# Patient Record
Sex: Male | Born: 1945
Health system: Southern US, Community
[De-identification: ages and names within clinical notes are randomized; demographics above are authoritative.]

## PROBLEM LIST (undated history)

## (undated) DIAGNOSIS — I509 Heart failure, unspecified: Secondary | ICD-10-CM

## (undated) DIAGNOSIS — G2581 Restless legs syndrome: Secondary | ICD-10-CM

## (undated) DIAGNOSIS — I639 Cerebral infarction, unspecified: Secondary | ICD-10-CM

## (undated) DIAGNOSIS — R0602 Shortness of breath: Secondary | ICD-10-CM

## (undated) DIAGNOSIS — M869 Osteomyelitis, unspecified: Secondary | ICD-10-CM

## (undated) DIAGNOSIS — I6529 Occlusion and stenosis of unspecified carotid artery: Secondary | ICD-10-CM

## (undated) DIAGNOSIS — I951 Orthostatic hypotension: Secondary | ICD-10-CM

## (undated) DIAGNOSIS — J189 Pneumonia, unspecified organism: Secondary | ICD-10-CM

## (undated) DIAGNOSIS — K219 Gastro-esophageal reflux disease without esophagitis: Secondary | ICD-10-CM

## (undated) DIAGNOSIS — I10 Essential (primary) hypertension: Secondary | ICD-10-CM

## (undated) DIAGNOSIS — Z9889 Other specified postprocedural states: Secondary | ICD-10-CM

## (undated) DIAGNOSIS — R112 Nausea with vomiting, unspecified: Secondary | ICD-10-CM

## (undated) DIAGNOSIS — Z972 Presence of dental prosthetic device (complete) (partial): Secondary | ICD-10-CM

## (undated) DIAGNOSIS — L97524 Non-pressure chronic ulcer of other part of left foot with necrosis of bone: Secondary | ICD-10-CM

## (undated) DIAGNOSIS — E039 Hypothyroidism, unspecified: Secondary | ICD-10-CM

## (undated) DIAGNOSIS — M1990 Unspecified osteoarthritis, unspecified site: Secondary | ICD-10-CM

## (undated) DIAGNOSIS — I429 Cardiomyopathy, unspecified: Secondary | ICD-10-CM

## (undated) DIAGNOSIS — H9190 Unspecified hearing loss, unspecified ear: Secondary | ICD-10-CM

## (undated) DIAGNOSIS — I739 Peripheral vascular disease, unspecified: Secondary | ICD-10-CM

## (undated) DIAGNOSIS — G4733 Obstructive sleep apnea (adult) (pediatric): Secondary | ICD-10-CM

## (undated) HISTORY — PX: BACK SURGERY: SHX140

## (undated) HISTORY — PX: MULTIPLE TOOTH EXTRACTIONS: SHX2053

## (undated) HISTORY — DX: Occlusion and stenosis of unspecified carotid artery: I65.29

## (undated) HISTORY — DX: Peripheral vascular disease, unspecified: I73.9

## (undated) HISTORY — PX: OTHER SURGICAL HISTORY: SHX169

## (undated) HISTORY — DX: Non-pressure chronic ulcer of other part of left foot with necrosis of bone: L97.524

## (undated) HISTORY — DX: Osteomyelitis, unspecified: M86.9

## (undated) HISTORY — DX: Obstructive sleep apnea (adult) (pediatric): G47.33

## (undated) HISTORY — PX: EYE SURGERY: SHX253

## (undated) HISTORY — PX: FOOT SURGERY: SHX648

## (undated) HISTORY — PX: SPINE SURGERY: SHX786

## (undated) HISTORY — PX: CERVICAL FUSION: SHX112

## (undated) HISTORY — DX: Cerebral infarction, unspecified: I63.9

---

## 2005-06-23 ENCOUNTER — Ambulatory Visit (HOSPITAL_COMMUNITY): Admission: RE | Admit: 2005-06-23 | Discharge: 2005-06-23 | Payer: Self-pay | Admitting: Pulmonary Disease

## 2007-03-15 ENCOUNTER — Ambulatory Visit (HOSPITAL_COMMUNITY): Admission: RE | Admit: 2007-03-15 | Discharge: 2007-03-15 | Payer: Self-pay | Admitting: Pulmonary Disease

## 2007-04-13 ENCOUNTER — Inpatient Hospital Stay (HOSPITAL_COMMUNITY): Admission: RE | Admit: 2007-04-13 | Discharge: 2007-04-17 | Payer: Self-pay | Admitting: Neurosurgery

## 2007-05-26 ENCOUNTER — Encounter: Admission: RE | Admit: 2007-05-26 | Discharge: 2007-05-26 | Payer: Self-pay | Admitting: Neurosurgery

## 2007-05-28 ENCOUNTER — Encounter: Admission: RE | Admit: 2007-05-28 | Discharge: 2007-05-28 | Payer: Self-pay | Admitting: Neurosurgery

## 2007-06-05 ENCOUNTER — Ambulatory Visit (HOSPITAL_COMMUNITY): Admission: RE | Admit: 2007-06-05 | Discharge: 2007-06-05 | Payer: Self-pay | Admitting: Neurosurgery

## 2008-02-19 ENCOUNTER — Ambulatory Visit (HOSPITAL_COMMUNITY): Admission: RE | Admit: 2008-02-19 | Discharge: 2008-02-19 | Payer: Self-pay | Admitting: Ophthalmology

## 2008-04-08 ENCOUNTER — Ambulatory Visit (HOSPITAL_COMMUNITY): Admission: RE | Admit: 2008-04-08 | Discharge: 2008-04-08 | Payer: Self-pay | Admitting: Ophthalmology

## 2008-06-03 ENCOUNTER — Ambulatory Visit (HOSPITAL_COMMUNITY): Admission: RE | Admit: 2008-06-03 | Discharge: 2008-06-03 | Payer: Self-pay | Admitting: Pulmonary Disease

## 2008-06-10 ENCOUNTER — Ambulatory Visit (HOSPITAL_COMMUNITY): Admission: RE | Admit: 2008-06-10 | Discharge: 2008-06-10 | Payer: Self-pay | Admitting: Pulmonary Disease

## 2008-06-26 ENCOUNTER — Ambulatory Visit: Payer: Self-pay | Admitting: Orthopedic Surgery

## 2008-06-26 DIAGNOSIS — M19079 Primary osteoarthritis, unspecified ankle and foot: Secondary | ICD-10-CM | POA: Insufficient documentation

## 2008-06-26 HISTORY — DX: Primary osteoarthritis, unspecified ankle and foot: M19.079

## 2008-06-28 ENCOUNTER — Telehealth: Payer: Self-pay | Admitting: Orthopedic Surgery

## 2008-07-01 ENCOUNTER — Telehealth: Payer: Self-pay | Admitting: Orthopedic Surgery

## 2008-07-05 ENCOUNTER — Telehealth: Payer: Self-pay | Admitting: Orthopedic Surgery

## 2008-07-09 ENCOUNTER — Encounter: Admission: RE | Admit: 2008-07-09 | Discharge: 2008-07-09 | Payer: Self-pay | Admitting: Neurosurgery

## 2008-07-10 ENCOUNTER — Encounter: Payer: Self-pay | Admitting: Orthopedic Surgery

## 2008-08-06 ENCOUNTER — Ambulatory Visit (HOSPITAL_BASED_OUTPATIENT_CLINIC_OR_DEPARTMENT_OTHER): Admission: RE | Admit: 2008-08-06 | Discharge: 2008-08-07 | Payer: Self-pay | Admitting: Orthopedic Surgery

## 2008-10-29 ENCOUNTER — Encounter: Payer: Self-pay | Admitting: Orthopedic Surgery

## 2010-02-18 HISTORY — PX: ESOPHAGOGASTRODUODENOSCOPY: SHX1529

## 2010-02-20 ENCOUNTER — Ambulatory Visit: Payer: Self-pay | Admitting: Internal Medicine

## 2010-02-21 ENCOUNTER — Ambulatory Visit (HOSPITAL_COMMUNITY): Admission: EM | Admit: 2010-02-21 | Discharge: 2010-02-21 | Payer: Self-pay | Admitting: Emergency Medicine

## 2010-02-24 ENCOUNTER — Encounter: Payer: Self-pay | Admitting: Urgent Care

## 2010-03-13 ENCOUNTER — Ambulatory Visit (HOSPITAL_COMMUNITY): Admission: RE | Admit: 2010-03-13 | Discharge: 2010-03-13 | Payer: Self-pay | Admitting: Pulmonary Disease

## 2010-03-17 ENCOUNTER — Ambulatory Visit (HOSPITAL_COMMUNITY): Admission: RE | Admit: 2010-03-17 | Discharge: 2010-03-17 | Payer: Self-pay | Admitting: Pulmonary Disease

## 2010-03-18 ENCOUNTER — Encounter: Payer: Self-pay | Admitting: Gastroenterology

## 2010-03-19 ENCOUNTER — Inpatient Hospital Stay (HOSPITAL_COMMUNITY): Admission: AD | Admit: 2010-03-19 | Discharge: 2010-03-20 | Payer: Self-pay | Admitting: Neurological Surgery

## 2010-03-24 ENCOUNTER — Telehealth (INDEPENDENT_AMBULATORY_CARE_PROVIDER_SITE_OTHER): Payer: Self-pay

## 2010-03-30 ENCOUNTER — Encounter: Payer: Self-pay | Admitting: Internal Medicine

## 2010-05-18 ENCOUNTER — Ambulatory Visit (HOSPITAL_COMMUNITY): Admission: RE | Admit: 2010-05-18 | Discharge: 2010-05-18 | Payer: Self-pay | Admitting: Ophthalmology

## 2010-10-20 NOTE — Letter (Signed)
Summary: Internal Other Domingo Dimes  Internal Other Domingo Dimes   Imported By: Cloria Spring LPN 16/06/9603 54:09:81  _____________________________________________________________________  External Attachment:    Type:   Image     Comment:   External Document

## 2010-10-20 NOTE — Letter (Signed)
Summary: Dr Lestine Box Office notes  External Correspondence   Imported By: Elvera Maria 07/12/2008 08:58:16  _____________________________________________________________________  External Attachment:    Type:   Image     Comment:   Dr Lestine Box

## 2010-10-20 NOTE — Letter (Signed)
Summary: Dr Lestine Box Office notes  Dr Lestine Box Office notes   Imported By: Cammie Sickle 11/04/2008 10:25:24  _____________________________________________________________________  External Attachment:    Type:   Image     Comment:   External Document

## 2010-10-20 NOTE — Progress Notes (Signed)
Summary: call patient  Phone Note Call from Patient Call back at Home Phone [phone] Call back at [phone] cell   Summary of Call: please call patient as soon as you can, would not give info to me Initial call taken by: Chasity Tereasa Coop,  June 28, 2008 9:54 AM  Follow-up for Phone Call        called monday 12th 930pm

## 2010-10-20 NOTE — Medication Information (Signed)
Summary: Tax adviser   Imported By: Diana Eves 02/24/2010 11:04:39  _____________________________________________________________________  External Attachment:    Type:   Image     Comment:   External Document  Appended Document: RX Folder Pt needs solutabs given hx tight stricture and modified diet.  He may be able to change to capsules once re-dilated. Please let pharm know.  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 He said pt is not going to pick up solutabs because they will cost $70.00. He just got something over the counter for now.Please  advise!  Appended Document: RX Folder Any PPI OTC that he is able to swallow should be fine just verify this is a PPI and warn pt risks of choking until esophagus is stretched again.  Thanks  Appended Document: [facility] pharmacy aware, tried to call pt- LMOM

## 2010-10-20 NOTE — Assessment & Plan Note (Signed)
Summary: Rt foot pain/swelling/XR & MRI at [facility]   Vital Signs:  Patient Profile:   65 Years Old Male Weight:      263 pounds Pulse rate:   78 / minute Resp:     16 per minute  Vitals Entered By: Fuller Canada MD (June 26, 2008 2:12 PM)                 Chief Complaint:  right foot and ankle pain.  History of Present Illness: I saw Sylvio Hollister in the office today for an initial visit.  He is a 65 years old man with the complaint of:  chronic right foot, big toe pain and ankle pain with swelling, consult [facility].  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 He has difficulty kneeling, which he has to do frequently for his job, and with pushoff while walking. rt foot xrays taken [facility] 06-03-08.  MRI right foot 06-10-08 [facility].  rt ankle xrays 06-03-08 [facility].  Dr. Ezzard Standing performed lumbar disc surgery approximately one year ago and he has a residual dropfoot, spasms and weakness in the right leg from that surgery.  The surgery was done at the L4-L5 region.      Prior Medication List:  No prior medications documented  Updated Prior Medication List: he says no medicines other than the Advil that he took for his foot Current Allergies (reviewed today): ! CODEINE  Past Medical History:    Reviewed history and no changes required:       diabetic       htn       allergies       drop foot       back problems  Past Surgical History:    Reviewed history and no changes required:       lower back Nudelman 2008       eyes   Family History:    Reviewed history and no changes required:       FH of Cancer:        Family History of Arthritis  Social History:    Reviewed history and no changes required:       Patient is married.        med tech   Risk Factors:  Tobacco use:  never Caffeine use:  0 drinks per day Alcohol use:  no   Review of  Systems  General      Denies weight loss, weight gain, fever, chills, and fatigue.  Cardiac      Denies chest pain, angina, heart attack, heart failure, poor circulation, blood clots, and phlebitis.  Resp      Denies short of breath, difficulty breathing, COPD, cough, and pneumonia.  GI      Denies nausea, vomiting, diarrhea, constipation, difficulty swallowing, ulcers, GERD, and reflux.  GU      Denies kidney failure, kidney transplant, kidney stones, burning, poor stream, testicular cancer, blood in urine, and .  Neuro      Complains of unsteady walking.      Denies headache, dizziness, migraines, numbness, weakness, and tremor.  MS      Complains of joint swelling.      Denies joint pain, rheumatoid arthritis, gout, bone cancer, osteoporosis, and .  Endo      Complains of diabetes.      Denies thyroid disease and goiter.  Psych      Denies depression, mood swings, anxiety, panic attack, bipolar, and schizophrenia.  Derm      Denies eczema, cancer, and itching.  EENT      Denies poor vision, cataracts, glaucoma, poor hearing, vertigo, ears ringing, sinusitis, hoarseness, toothaches, and bleeding gums.  Immunology      Denies seasonal allergies, sinus problems, and allergic to bee stings.  Lymphatic      Denies lymph node cancer and lymph edema.   Physical Exam  Skin:     intact without lesions or rashes Psych:     alert and cooperative; normal mood and affect; normal attention span and concentration   Foot/Ankle Exam  General:    He is well-developed and nourished grooming and hygiene are normal and his body habitus is large  Gait:    His gait is altered by pain full vaulting over the right foot  Skin:    skin over the right foot normal  Inspection:    the great toe at the metatarsophalangeal joint is swollen and deformed  Palpation:    there is tenderness over the right great toe with passive motion of flexion 10 in extension 15  Vascular:     no swelling, normal temperature, no edema, no tenderness. Good capillary refill.  Sensory:    pressure sensation is normal soft touch sensation is normal  Motor:    abnormal motor function in the right foot as stated with a dropfoot.  Reflexes:    zero at the ankles  Foot Exam:    Right:    Inspection:  Abnormal    Palpation:  Abnormal    The great toe is tender and painful as we stated previously.  He has motion maintained but it is painful motion.    Impression & Recommendations:  Problem # 1:  ARTHRITIS, RIGHT FOOT (ICD-716.97) The x-rays were done at [facility]. The report and the films have been reviewed.  He has complete destruction of the great toe joint.  I recommended he see a foot and ankle surgeon for possible fusion. He is concerned that he'll not be able to work.  I do not think he will be able to continue the kneeling and squatting with or without surgery. He should be moved to a position of sedentary work is possible.    Orders: New Patient Level III ([address])    Patient Instructions: 1)  Refer to Dr. Lestine Box for Great toe arthritis with hammer toes and severe metatarsalgia. 2)  Please schedule a follow-up appointment as needed.   ]  Appended Document: Rt foot pain/swelling/XR & MRI at [facility] Mr Brandy shoul dbe restricted from kneeling, squatting and climbing.   Appended Document: Rt foot pain/swelling/XR & MRI at [facility]

## 2010-10-20 NOTE — Progress Notes (Signed)
Summary: phone call  Phone Note Call from Patient   Summary of Call: patient cannot get transfer within his job, will you please call him at these numbers   [phone], or [phone].  Initial call taken by: Fuller Canada MD,  July 01, 2008 9:56 AM

## 2010-10-20 NOTE — Letter (Signed)
Summary: Historic Patient File  Historic Patient File   Imported By: Jacklynn Ganong 06/27/2008 09:55:48  _____________________________________________________________________  External Attachment:    Type:   Image     Comment:   history form

## 2010-10-20 NOTE — Progress Notes (Signed)
Summary: Referral to Dr. Lestine Box.  Phone Note Outgoing Call   Call placed by: Waldon Reining,  July 05, 2008 10:45 AM Call placed to: Specialist Action Taken: Information Sent Summary of Call: I faxed a referral for this patient to Dr. Lestine Box to be seen for great toe arthritis with hammer toes and severe metatarsalgia.

## 2010-10-20 NOTE — Progress Notes (Signed)
Summary: Question of when to schedule EGD/ED...just had neck surgery  Phone Note Outgoing Call   Call placed by: Kham Zuckerman Call placed to: Patient Summary of Call: Called pt to schedule the EGD.... He said he just had major neck surgery on 03/19/2010.... he had a compressed spinal cord....had  a recent fall. He would like to know how long he needs to wait to schedule. Please advise! Initial call taken by: Cloria Spring LPN,  March 24, 1609 4:08 PM     Appended Document: Question of when to schedule EGD/ED...just had neck surgery chop up meats/ soft diet; no EGD / ED until nerosurgeon says OK  Appended Document: Question of when to schedule EGD/ED...just had neck surgery Called and got fax machine.  Appended Document: Question of when to schedule EGD/ED...just had neck surgery Pt informed.

## 2010-10-20 NOTE — Letter (Signed)
Summary: Internal Other/triage for EGD/ED  Internal Other/triage for EGD/ED   Imported By: Cloria Spring LPN 04/54/0981 19:14:78  _____________________________________________________________________  External Attachment:    Type:   Image     Comment:   External Document  Appended Document: Internal Other/triage for EGD/ED asa ok; he may stay on it for EGD w ED  Appended Document: Internal Other/triage for EGD/ED Please see 03/30/2010 append note.

## 2010-12-06 LAB — SURGICAL PCR SCREEN
MRSA, PCR: NEGATIVE
Staphylococcus aureus: NEGATIVE

## 2010-12-06 LAB — GLUCOSE, CAPILLARY
Glucose-Capillary: 209 mg/dL — ABNORMAL HIGH (ref 70–99)
Glucose-Capillary: 157 mg/dL — ABNORMAL HIGH (ref 70–99)
Glucose-Capillary: 163 mg/dL — ABNORMAL HIGH (ref 70–99)
Glucose-Capillary: 297 mg/dL — ABNORMAL HIGH (ref 70–99)

## 2010-12-06 LAB — BASIC METABOLIC PANEL
BUN: 13 mg/dL (ref 6–23)
CO2: 29 mEq/L (ref 19–32)
Calcium: 9.6 mg/dL (ref 8.4–10.5)
Chloride: 103 mEq/L (ref 96–112)
Creatinine, Ser: 0.87 mg/dL (ref 0.4–1.5)
GFR calc Af Amer: >60 mL/min (ref >60)
GFR calc non Af Amer: >60 mL/min (ref >60)
Glucose, Bld: 143 mg/dL — ABNORMAL HIGH (ref 70–99)
Potassium: 4.3 mEq/L (ref 3.5–5.1)
Sodium: 138 mEq/L (ref 135–145)

## 2010-12-06 LAB — CBC
HCT: 41.5 % (ref 39.0–52.0)
Hemoglobin: 14.1 g/dL (ref 13.0–17.0)
MCH: 32.5 pg (ref 26.0–34.0)
MCHC: 34.1 g/dL (ref 30.0–36.0)
MCV: 95.3 fL (ref 78.0–100.0)
Platelets: 268 10*3/uL (ref 150–400)
RBC: 4.35 MIL/uL (ref 4.22–5.81)
RDW: 13.4 % (ref 11.5–15.5)
WBC: 7.7 10*3/uL (ref 4.0–10.5)

## 2011-02-02 ENCOUNTER — Other Ambulatory Visit (HOSPITAL_COMMUNITY): Payer: Self-pay | Admitting: Pulmonary Disease

## 2011-02-02 ENCOUNTER — Ambulatory Visit (HOSPITAL_COMMUNITY)
Admission: RE | Admit: 2011-02-02 | Discharge: 2011-02-02 | Disposition: A | Discharge status: Home / Self Care | Payer: BC Managed Care – PPO | Source: Ambulatory Visit | Attending: Pulmonary Disease | Admitting: Pulmonary Disease

## 2011-02-02 DIAGNOSIS — T17910A Gastric contents in respiratory tract, part unspecified causing asphyxiation, initial encounter: Secondary | ICD-10-CM

## 2011-02-02 DIAGNOSIS — I10 Essential (primary) hypertension: Secondary | ICD-10-CM | POA: Insufficient documentation

## 2011-02-02 NOTE — Discharge Summary (Signed)
NAMECRISTHIAN, [name]              ACCOUNT NO.:  [id number]   MEDICAL RECORD NO.:  [id number]          PATIENT TYPE:  INP   LOCATION:  3033                         FACILITY:  [facility]   PHYSICIAN:  Hewitt Shorts, M.D.DATE OF BIRTH:  10-19-1945   DATE OF ADMISSION:  04/13/2007  DATE OF DISCHARGE:  04/17/2007                               DISCHARGE SUMMARY   ADMISSION HISTORY AND PHYSICAL EXAMINATION:  The patient is a 65-year-  old man who was treated for a right lumbar radiculopathy.  X-rays showed  a dynamic degenerative grade I spondylolisthesis at L4-5 secondary to  facet arthropathy.  There is moderate to marked multifactorial lumbar  stenosis at L4-5 with a degenerative spondylolisthesis, disc bulging and  facet arthropathy.  A large, right L4-5 extraforaminal disc herniation  with significant compression of the extraforaminal portion of the right  L4 nerve root.  Patient was admitted for decompression and arthrodesis.   PHYSICAL EXAMINATION:  GENERAL EXAMINATION:  Unremarkable.  NEUROLOGIC EXAMINATION:  Showed 5/5 strength.  Good sensation.   HOSPITAL COURSE:  Patient was admitted, underwent a bilateral L4 and L5  lumbar laminectomy, microdiskectomy and posterior lumbar antibody fusion  and posterior lateral arthrodesis.  Postoperatively, he had some nausea.  His mobility was gradual.  He was able to gradually increase his  activities.  At the time of discharge, his wound was healing well.  He  was voiding without difficulty, taking p.o. well and he was discharged  to home with instructions on wound care and activities.  He was to  return 4 days later for staple removal.  He was given prescriptions for  Percocet 1 or 2 q.4 to 6 hours p.r.n. pain, 60 tablets prescribed, no  refills.   DISCHARGE DIAGNOSIS:  Lumbar stenosis, lumbar degenerative  spondylolisthesis, lumbar spondylosis and lumbar radiculopathy.      Hewitt Shorts, M.D.  Electronically Signed     RWN/MEDQ  D:  05/17/2007  T:  05/17/2007  Job:  [id number]

## 2011-02-02 NOTE — H&P (Signed)
NAMEHUSSIEN, GREENBLATT              ACCOUNT NO.:  [id number]   MEDICAL RECORD NO.:  [id number]          PATIENT TYPE:  INP   LOCATION:  3033                         FACILITY:  [facility]   PHYSICIAN:  Hewitt Shorts, M.D.DATE OF BIRTH:  [birth year]   DATE OF ADMISSION:  04/13/2007  DATE OF DISCHARGE:                              HISTORY & PHYSICAL   HISTORY OF PRESENT ILLNESS:  This patient is a 65 year old, left-handed  white male who is evaluated regarding a right lumbar radiculopathy.  The  patient developed pain into the right groin and into the anterior right  thigh and leg, some superior right buttock pain.  No midline low back  pain.  Pain extending into the anterolateral aspect of the right thigh  and leg with burning and numbness.  The patient was treated with  hydrocodone and Naprosyn and evaluated with MRI.   X-rays showed a dynamic degenerative grade 1 spondylolisthesis of L4 and  5 with multilevel degenerative changes.  MRI scan revealed again the  grade 1 spondylolisthesis of L4 and 5 secondary to facet arthropathy.  There was moderate to marked multifactorial spinal stenosis at L4-5,  contributed to by the degenerative spondylolisthesis, disk bulging and  facet hypertrophy.  There was also a large right L4-5 extraforaminal  disk herniation with a significant compression of the extraforaminal  portion of the L4 nerve roots.   The patient is admitted now for a decompression and arthrodesis.   PAST MEDICAL HISTORY:  Notable for a history of hypertension and  diabetes for over 25 years.  No history of myocardial infarction,  cancer, stroke, peptic ulcer disease or lung disease.  No previous  surgeries.   ALLERGIES:  HE HAS NO ACTUAL ALLERGIES TO MEDICATIONS, BUT CODEINE CAN  CAUSE NAUSEA AND VOMITING.   MEDICATIONS:  Medications included:  1. Amaryl 4 mg daily.  2. Diovan/hydrochlorothiazide 160/12.5 mg daily.  3. Naproxen b.i.d.  4. Hydrocodone 10/500 q.i.d.  5.  Zyrtec daily.  6. Aspirin 81 mg daily.   FAMILY HISTORY:  His father passed on of causes unknown.  Mother is in  poor health at age 109 with hypertension.   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  He is married and  does not smoke, drink alcoholic beverages or have a history of substance  abuse.   REVIEW OF SYSTEMS:  Notable for those symptoms described in his history  of present illness and past medical history, but is otherwise  unremarkable.   PHYSICAL EXAMINATION:  GENERAL:  This patient is a well-developed, well-  nourished white male in no acute distress.  VITAL SIGNS:  His temperature is 98.7.  Pulse of 108.  Blood pressure  133/88.  Respiratory rate 18.  Height 6 feet.  Weight 258 pounds.  LUNGS:  Clear to auscultation.  He has symmetrical respirations and  excursion.  HEART:  Regular rate and rhythm, normal S1, S2.  There is no murmur.  ABDOMEN:  Soft, nondistended, bowel sounds are present.  EXTREMITY EXAMINATION:  Shows no clubbing, cyanosis or  edema.  MUSCULOSKELETAL EXAMINATION:  Shows limited mobility in forward flexion  to about 45 degrees due to pain.  He is able to extend though to 30  degrees.  There is no tenderness to palpation over the lumbar spine or  paraspinous area or parallel lumbar musculature.  Straight-leg raising  is negative on the left, but positive on the right with pain to the  right groin.  NEUROLOGICAL EXAMINATION:  Shows 5/5 strength of the lower extremities  including the iliopsoas, quadriceps, dorsiflexion, extension as well as  in plantar flexion bilaterally.  Sensation is intact to pinprick through  the upper and lower extremities.  Reflexes are minimal in the biceps,  quadriceps, gastrocnemii and is symmetrical bilaterally.  Toes are  downgoing bilaterally.  He has a normal gait and stance.   IMPRESSION:  Pain in the right upper buttock extending to the  right  groin and anterolateral right thigh and leg consistent with right L4  radiculopathy, which is felt to be due to a right L4-5 extraforaminal  disk herniation.  However, at that same level, he has moderate to marked  multifactorial lumbar stenosis and a dynamic degenerative grade 1  spondylolisthesis of L4 and 5 secondary to facet arthropathy.   PLAN:  Patient admitted for L4-5 decompressive lumbar laminectomy,  facetectomy, microdiskectomy, and right L4-5 extraforaminal  microdiskectomy, and a bilateral L4-5 posterior lumbar interbody fusion  with posterior arthrodesis with interbody implants, posterior  instrumentation and bone grafting.   We discussed alternatives to surgery, the extent and nature of his  condition and surgery, using x-rays, MRI scan and models in our office.  We discussed with him the surgery, hospital stay and overall  preparation, his limitations postoperatively, the need for postoperative  immobilization and a lumbar corset, and risks of surgery including the  risk of infection, bleeding, possible need for transfusion, the risk of  nerve dysfunction with pain, weakness, numbness or paresthesias, the  risk of dural tear and CSF and the possible need for further surgery,  the risk of failure of the arthrodesis and possible need for further  surgery, and anesthetic risks of myocardial infarction, stroke,  pneumonia and death.  Understanding all this, he wishes to go ahead with  his surgery.  He does  understand his risks are increased due to his  history of diabetes and hypertension.      Hewitt Shorts, M.D.  Electronically Signed     RWN/MEDQ  D:  04/14/2007  T:  04/14/2007  Job:  [id number]

## 2011-02-02 NOTE — Op Note (Signed)
[name], [name]              ACCOUNT NO.:  [id number]   MEDICAL RECORD NO.:  [id number]          PATIENT TYPE:  AMB   LOCATION:  DSC                          FACILITY:  [facility]   PHYSICIAN:  Leonides Grills, M.D.     DATE OF BIRTH:  03/12/1946   DATE OF PROCEDURE:  08/06/2008  DATE OF DISCHARGE:                               OPERATIVE REPORT   PREOPERATIVE DIAGNOSES:  1. Advanced right hallux rigidus.  2. Right tight gastroc.  3. Right second and third hammertoes.   POSTOPERATIVE DIAGNOSES:  1. Advanced right hallux rigidus.  2. Right tight gastroc.  3. Right second and third hammertoes.   OPERATION:  1. Right great toe metatarsophalangeal joint fusion.  2. Right local bone graft.  3. Stress x-rays, right foot.  4. Right great toe digital nerve neurolysis.  5. Right gastroc slide.  6. Right second and third toes metatarsophalangeal joint dorsal      capsulotomy and collateral release.  7. Right second and third toes proximal phalanx head resection.  8. Right second and third toes EDB to EDL tendon transfer.  9. Right second and third toes FDL to proximal phalanx tendon      transfer.   ANESTHESIA:  General.   SURGEON:  Leonides Grills, MD   ASSISTANT:  Richardean Canal, PA-C   ESTIMATED BLOOD LOSS:  Minimal.   TOURNIQUET TIME:  Approximately an hour and 20 minutes.   COMPLICATIONS:  None.   DISPOSITION:  Stable to the PR.   INDICATIONS:  This is a 65 year old gentleman who has had longstanding  right forefoot pain due to the above pathology.  He consented for the  above procedure.  All risks including infection or vessel injury,  nonunion, malunion, hardware irritation, hardware failure, persistent  pain, worsening pain, prolonged recovery, stiffness, arthritis,  recurrence of cock-up toe deformity of the second and third toes or  hammertoe deformity, ischemia especially due to diabetes were all  explained.  Questions were encouraged and answered.   DESCRIPTION OF  PROCEDURE:  The patient was brought to the operating room  and placed in supine position.  After adequate general endotracheal tube  anesthesia was administered as well as Ancef 1 g IV piggyback.  Right  lower extremity was then prepped and draped in sterile manner.  Proximally, a thigh tourniquet.  A longitudinal incision was made over  the medial aspect of gastrocnemius muscle tendinous junction.  Dissection was carried down through the skin.  Hemostasis was obtained.  Fascia was opened at line of the incision.  Conjoined region was then  developed between gastroc soleus musculature.  Soft tissue was elevated  off the posterior aspect of gastrocnemius.  Sural nerve was identified  and protected posteriorly.  Gastrocnemius was then released with curved  Mayo scissors.  This had excellent release of tight gastroc.  The areas  was copiously irrigated with normal saline.  Subcu was closed with 3-0  Vicryl and the skin was closed with 4-0 nylon.  The limb was gravity  exsanguinated.  Tourniquet was elevated to 290 mmHg.  A longitudinal  incision at midline over  medial aspect of the right great toe MTP joint  was then made.  Dissection was carried down through the skin.  Hemostasis was obtained.  A great toe dorsomedial and digital nerve was  carefully dissected out and formal digital nerve neurolysis was then  performed.  Capsulotomy was then made.  Soft tissues was elevated on  superomedial and plantar lateral aspects.  We removed the spur with a  rongeur and placed it on the back table as local bone graft.  The  remaining cartilage in the joint which was minimal was removed with a  curved corners osteotome and a rongeur and curette.  Multiple 2-mm  drills were placed on either side of the joint.  The MTP joint was  reduced and provisionally fixed with a 2-mm K-wire.  This was then  visualized under C-arm guidance, AP, and lateral planes and showed that  the proximal phalanx was parallel to  the weightbearing surface of the  foot and had an adequate varus valgus correction.  We then placed one  3.5-mm fully-threaded cortical lag screw using 3.5 and 2.5 mm drill  holes respectively.  This had excellent purchase and compression across  the fusion site.  K-wire was removed and replaced with a 3.5-mm fully-  threaded cortical lag screw using a 3.5 and 2.5 mm drill hole  respectively.  This had excellent purchase and maintenance of the  correction.  Stress x-rays were obtained in AP and lateral planes showed  no gross motion, fixation, proposition, and excellent alignment as well.  The area was copiously irrigated with normal saline.  Local bone graft  obtained not only from the spurs and drill bits, but also the proximal  phalanx heads were grounded up on back table and applied as stress  strain relieving bone graft at later on in the procedure.  The capsule  was then closed over this with a 2-0 Vicryl stitch and subcu was closed  with 3-0 Vicryl, and skin was closed with 4-0 nylon.  We then made a  longitudinal incision over the right second toe.  Dissection was carried  down through the skin.  Hemostasis was obtained.  EDB and EDL tendons  were identified.  The EDL was tenotomized proximal medial and brevis  distal lateral and retracted out of harm's way for later transfer.  MTP  joint dorsal capsulotomy and collateral release was then performed with  the #15 blade scalpel protecting the soft tissues both medially and  laterally.  Distal aspect of the proximal phalanx was then skeletonized  and the head was then removed with the rongeur and followed by bone  cutter.  This cut was made perpendicular to long axis of the proximal  phalanx.  Soft tissue was then elevated and skeletonized over the distal  aspect of the proximal phalanx.  A longitudinal incision was then made  into the plantar plate, FDL tendon was identified, and then tenotomized  distal as possible.  We then  performed a 2.5-mm and then followed by 3.5-  mm drill hole into the base of proximal phalanx.  We then transferred  the FDL to the proximal phalanx through the drill hole from plantar to  dorsum using 3-0 PDS stitch.  This had an outstanding transfer.  We then  placed a 0.054 K-wire antegrade through middle and distal phalanx  reduced PIP joint and with tension on the FDL tendon through drill hole  and the toe held in reduced position, fired the K-wire across the MTP  joint.  This held the toe in excellent position.  We then transferred  the EDB to EDL tendon dorsally using 3-0 PDS stitch.  This had an  outstanding repair and we sutured this to the stump of the FDL tendon.  We did the exact same procedure for the third toe through separate  incision.  Once this was completed, skin relieving incisions were made  on either side of  the K-wire.  K-wire was bent, cut, and capped.  Tourniquet was deflated  and hemostasis was obtained.  Toes pinked up nicely.  Subcu was closed  with 4-0 PDS and skin was closed with 4-0 nylon over all wounds.  Sterile dressing was applied.  Lightweight Jones dressing was applied.  The patient was stable to the PR.      Leonides Grills, M.D.  Electronically Signed     PB/MEDQ  D:  08/06/2008  T:  08/07/2008  Job:  [id number]   cc:   Vickki Hearing, M.D.

## 2011-02-02 NOTE — Op Note (Signed)
NAMEJET, ARMBRUST              ACCOUNT NO.:  [id number]   MEDICAL RECORD NO.:  [id number]          PATIENT TYPE:  INP   LOCATION:  3033                         FACILITY:  [facility]   PHYSICIAN:  Hewitt Shorts, M.D.DATE OF BIRTH:  07/21/1946   DATE OF PROCEDURE:  04/13/2007  DATE OF DISCHARGE:                               OPERATIVE REPORT   PREOPERATIVE DIAGNOSES:  1. L4-5 lumbar stenosis.  2. L4-5 spondylytic disk herniation.  3. Lumbar spondylosis.  4. Lumbar degenerative disk disease.  5. Lumbar radiculopathy.   POSTOPERATIVE DIAGNOSES:  1. L4-5 lumbar stenosis.  2. L4-5 spondylytic disk herniation.  3. Lumbar spondylosis.  4. Lumbar degenerative disk disease.  5. Lumbar radiculopathy.   PROCEDURE:  Bilateral L4-5 lumbar laminotomy, facetectomy,  microdiskectomy with microdissection and posterior lumbar interbody  fusion with AVS PEEK interbody implants and VITOSS with bone marrow  aspirate and posterolateral arthrodesis with radius posterior  instrumentation and VITOSS with bone marrow aspirate.   SURGEON:  Hewitt Shorts, M.D.   ASSISTANTS:  1. Russell L. Webb Silversmith, RN  2. Lovell Sheehan.   ANESTHESIA:  General endotracheal.   INDICATION:  The patient is a 65 year old man who presented with right  lumbar radiculopathy.  He was found to have extensive multilevel  degenerative disk disease and spondylosis.  He had a grade 1  degenerative spondylolisthesis at L4 and 5, cervical facet arthropathy  with moderate to marked multifactorial lumbar stenosis at L4-5  contributed to by the degenerative spondylolisthesis, disk protrusion  and facet hypertrophy.  There is also a right L4-5 extraforaminal disk  herniation with compression of the L4 nerve root extraforaminally.  Decision was made to proceed with decompression and stabilization.   PROCEDURE:  The patient was brought to the operating room, placed under  general endotracheal anesthesia.  The patient was turned to a  prone  position.  Lumbar region was prepped with Betadine soaping solution,  draped in a sterile fashion.  The midline was infiltrated with local  anesthetic with epinephrine.  A midline incision was made, carried down  through the subcutaneous tissue.  X-rays were taken for localization.  Dissection was carried down to the lumbar fascia which was incised  bilaterally.  The paraspinal muscles were dissected from the spinous  process and lamina in a subperiosteal fashion.  Another x-ray was taken  to further localize the L4-5 level and then using microdissection and  microsurgical technique, decompression was performed.  We proceeded with  bilateral L4-5 lumbar laminotomy and facetectomy using the XMax drill  and Kerrison punches.  The ligamentum flavum which was markedly  thickened was carefully removed.  Dissection was carried out  particularly laterally to the right exposing the exiting right L4 nerve  root we exposed the annulus of the L4-5 disk bilaterally.  Overlying  epidural veins were coagulated and divided and the spondylytic disk  herniation which was present in a broad based fashion was incised and  thorough diskectomy performed and removal of extensive amounts of  degenerative disk material.  Spondylytic bone growth in the posterior  aspects of the L4 and L5 vertebrae were removed  further decompressing  the spinal canal and exiting nerve roots.  We then began to prepare the  end plates of the vertebral bodies for arthrodesis using a variety of  paddle curets to scrape the cartilaginous end plates away exposing a  good bony surface bilaterally.  We then carefully examined the exiting  L4 nerve root to ensure that it was well decompressed.  This was  confirmed.  We then measured the height to the intervertebral disk space  and selected 11 mm height interbody implants.  We then probed the left  L5 pedicle.  Bone marrow was aspirated from the vertebral body and  injected over a 10  mL strip of VITOSS.  Using the VITOSS with bone  marrow aspirate, the PEEK cages were packed with the material and then  carefully tracking the thecal sac and nerve root, we placed the first  cage on the right side.  We then went to the left side packing  additional VITOSS with bone marrow aspirate in the midline of the  intervertebral disk space and then placed the second cage on the left  side.  We then took additional VITOSS with bone marrow aspirate and  packed it lateral to each of the cages in the intervertebral disk space.  We then draped the C-arm fluoroscope and it was brought into the field  to provide guidance in placing the pedicle fixation bilaterally.  We  identified the pedicle entry sites for L4 bilaterally as well as on the  right side at L5.  We reconfirmed the positioning on the left side at  L5.  Each of the pedicles was probed, examined with a ball probe.  No  cut outs were found.  Each of them was tapped with a 5.25 mm tap.  Again, examined with a ball probe, good threading was noted and no cut  outs were found.  We then placed 5.75 x 45 mm screws bilaterally at each  level.  We then selected 30 mm prelordosed rods that were placed in the  screw heads and locking caps were placed which were subsequently  tightened against a countertorque.  The transverse process at L4 and L5  had been previously exposed and decorticated.  The remaining VITOSS with  bone marrow aspirate was packed over the transverse process and  intertransverse space.  There was a small thinned area of dura along the  lateral aspect of the right side of the thecal sac.  We placed a single  6-0 Prolene in a figure-of-eight fashion to secure that closed and  placed a small pledget of Duraform over that.  After the stitch was  placed, we Valsalvad the patient.  No CSF leakage was noted and then the  Duraform was placed.  In the end, it was felt that good decompression  had been achieved and good  stabilization had been achieved.  The wound  was then closed in multiple layers.  The deep fascia was closed with  interrupted undyed 1 Vicryl sutures.  The subcutaneous and subcuticular  areas were closed with interrupted, inverted 2-0 undyed Vicryl sutures  and the skin edges were closed with surgical staples.  The wound was  dressed with Adaptic and sterile gauze and Hypafix.  The procedure was  tolerated well.  The estimated blood loss was 150 mL.  We did use a Cell  Saver during the procedure, but there was insufficient blood loss to  process the collected blood loss and the patient was subsequently to be  turned to a supine position, reversed from anesthetic, extubated and  transferred to the recovery room for further care.      Hewitt Shorts, M.D.  Electronically Signed     RWN/MEDQ  D:  04/13/2007  T:  04/13/2007  Job:  [id number]

## 2011-06-16 LAB — BASIC METABOLIC PANEL
BUN: 13
CO2: 28
Calcium: 9.4
Chloride: 103
Creatinine, Ser: 0.81
GFR calc Af Amer: >60
GFR calc non Af Amer: >60
Glucose, Bld: 157 — ABNORMAL HIGH
Potassium: 4.3
Sodium: 136

## 2011-06-16 LAB — HEMOGLOBIN AND HEMATOCRIT, BLOOD
HCT: 39
Hemoglobin: 13.8

## 2011-06-22 LAB — GLUCOSE, CAPILLARY
Glucose-Capillary: 119 — ABNORMAL HIGH
Glucose-Capillary: 124 — ABNORMAL HIGH
Glucose-Capillary: 148 — ABNORMAL HIGH
Glucose-Capillary: 158 — ABNORMAL HIGH
Glucose-Capillary: 240 — ABNORMAL HIGH

## 2011-06-22 LAB — BASIC METABOLIC PANEL
BUN: 11
CO2: 27
Calcium: 9
Chloride: 104
Creatinine, Ser: 0.79
GFR calc Af Amer: >60
GFR calc non Af Amer: >60
Glucose, Bld: 162 — ABNORMAL HIGH
Potassium: 4.2
Sodium: 137

## 2011-06-22 LAB — POCT HEMOGLOBIN-HEMACUE: Hemoglobin: 14

## 2011-07-02 ENCOUNTER — Other Ambulatory Visit (HOSPITAL_COMMUNITY): Payer: Self-pay | Admitting: Pulmonary Disease

## 2011-07-02 ENCOUNTER — Ambulatory Visit (HOSPITAL_COMMUNITY)
Admission: RE | Admit: 2011-07-02 | Discharge: 2011-07-02 | Disposition: A | Discharge status: Home / Self Care | Payer: BC Managed Care – PPO | Source: Ambulatory Visit | Attending: Pulmonary Disease | Admitting: Pulmonary Disease

## 2011-07-02 DIAGNOSIS — R059 Cough, unspecified: Secondary | ICD-10-CM

## 2011-07-02 DIAGNOSIS — R0602 Shortness of breath: Secondary | ICD-10-CM

## 2011-07-02 DIAGNOSIS — J4489 Other specified chronic obstructive pulmonary disease: Secondary | ICD-10-CM | POA: Insufficient documentation

## 2011-07-02 DIAGNOSIS — R05 Cough: Secondary | ICD-10-CM

## 2011-07-02 DIAGNOSIS — J449 Chronic obstructive pulmonary disease, unspecified: Secondary | ICD-10-CM | POA: Insufficient documentation

## 2011-07-05 LAB — ABO/RH: ABO/RH(D): A POS

## 2011-07-05 LAB — CBC
HCT: 44
Hemoglobin: 15
MCHC: 34.1
MCV: 91.5
Platelets: 344
RBC: 4.81
RDW: 13
WBC: 13.6 — ABNORMAL HIGH

## 2011-07-05 LAB — TYPE AND SCREEN
ABO/RH(D): A POS
Antibody Screen: NEGATIVE

## 2011-07-05 LAB — BASIC METABOLIC PANEL
BUN: 17
CO2: 24
Calcium: 9.8
Chloride: 102
Creatinine, Ser: 0.98
GFR calc Af Amer: >60
GFR calc non Af Amer: >60
Glucose, Bld: 201 — ABNORMAL HIGH
Potassium: 4.2
Sodium: 134 — ABNORMAL LOW

## 2012-02-03 ENCOUNTER — Other Ambulatory Visit: Payer: Self-pay

## 2012-02-03 ENCOUNTER — Other Ambulatory Visit (HOSPITAL_COMMUNITY): Payer: Self-pay | Admitting: Pulmonary Disease

## 2012-02-03 DIAGNOSIS — IMO0001 Reserved for inherently not codable concepts without codable children: Secondary | ICD-10-CM | POA: Diagnosis not present

## 2012-02-03 DIAGNOSIS — M545 Low back pain, unspecified: Secondary | ICD-10-CM | POA: Diagnosis not present

## 2012-02-03 DIAGNOSIS — J440 Chronic obstructive pulmonary disease with acute lower respiratory infection: Secondary | ICD-10-CM | POA: Diagnosis not present

## 2012-02-03 DIAGNOSIS — R0602 Shortness of breath: Secondary | ICD-10-CM

## 2012-02-04 ENCOUNTER — Ambulatory Visit (HOSPITAL_COMMUNITY)
Admission: RE | Admit: 2012-02-04 | Discharge: 2012-02-04 | Disposition: A | Discharge status: Home / Self Care | Payer: Medicare Other | Source: Ambulatory Visit | Attending: Pulmonary Disease | Admitting: Pulmonary Disease

## 2012-02-04 DIAGNOSIS — M5137 Other intervertebral disc degeneration, lumbosacral region: Secondary | ICD-10-CM | POA: Insufficient documentation

## 2012-02-04 DIAGNOSIS — M51379 Other intervertebral disc degeneration, lumbosacral region without mention of lumbar back pain or lower extremity pain: Secondary | ICD-10-CM | POA: Insufficient documentation

## 2012-02-04 DIAGNOSIS — M47817 Spondylosis without myelopathy or radiculopathy, lumbosacral region: Secondary | ICD-10-CM | POA: Diagnosis not present

## 2012-02-04 DIAGNOSIS — R262 Difficulty in walking, not elsewhere classified: Secondary | ICD-10-CM | POA: Insufficient documentation

## 2012-02-04 DIAGNOSIS — M545 Low back pain, unspecified: Secondary | ICD-10-CM | POA: Diagnosis not present

## 2012-02-04 DIAGNOSIS — M5126 Other intervertebral disc displacement, lumbar region: Secondary | ICD-10-CM | POA: Insufficient documentation

## 2012-02-04 DIAGNOSIS — M79609 Pain in unspecified limb: Secondary | ICD-10-CM | POA: Diagnosis not present

## 2012-02-04 DIAGNOSIS — R0602 Shortness of breath: Secondary | ICD-10-CM | POA: Insufficient documentation

## 2012-02-04 MED ORDER — ALBUTEROL SULFATE (5 MG/ML) 0.5% IN NEBU
2.5000 mg | INHALATION_SOLUTION | Freq: Once | RESPIRATORY_TRACT | Status: AC
  Administered 2012-02-04: 2.5 mg via RESPIRATORY_TRACT

## 2012-02-07 NOTE — Procedures (Signed)
NAMEMELCHIZEDEK, ESPINOLA              ACCOUNT NO.:  [id number]  MEDICAL RECORD NO.:  [id number]  LOCATION:                                 FACILITY:  PHYSICIAN:  Daiya Tamer L. Juanetta Gosling, M.D.DATE OF BIRTH:  1946/08/29  DATE OF PROCEDURE:  02/06/2012 DATE OF DISCHARGE:                           PULMONARY FUNCTION TEST   Reason for pulmonary function testing is shortness of breath. 1. Spirometry shows a mild-to-moderate ventilatory defect with     evidence of airflow obstruction. 2. Lung volumes are normal. 3. DLCO is mildly reduced. 4. Airway resistance is elevated confirming the presence of airflow     obstruction. 5. There is significant bronchodilator improvement.     Mitchell Epling L. Juanetta Gosling, M.D.     ELH/MEDQ  D:  02/06/2012  T:  02/06/2012  Job:  [id number]

## 2012-02-17 ENCOUNTER — Ambulatory Visit (HOSPITAL_COMMUNITY)
Admission: RE | Admit: 2012-02-17 | Discharge: 2012-02-17 | Disposition: A | Discharge status: Home / Self Care | Payer: Medicare Other | Source: Ambulatory Visit | Attending: Physical Medicine and Rehabilitation | Admitting: Physical Medicine and Rehabilitation

## 2012-02-17 ENCOUNTER — Other Ambulatory Visit (HOSPITAL_COMMUNITY): Payer: Self-pay | Admitting: Physical Medicine and Rehabilitation

## 2012-02-17 ENCOUNTER — Other Ambulatory Visit: Payer: Self-pay | Admitting: Physical Medicine and Rehabilitation

## 2012-02-17 DIAGNOSIS — R269 Unspecified abnormalities of gait and mobility: Secondary | ICD-10-CM | POA: Diagnosis not present

## 2012-02-17 DIAGNOSIS — M5412 Radiculopathy, cervical region: Secondary | ICD-10-CM

## 2012-02-17 DIAGNOSIS — M4802 Spinal stenosis, cervical region: Secondary | ICD-10-CM

## 2012-02-17 DIAGNOSIS — M5020 Other cervical disc displacement, unspecified cervical region: Secondary | ICD-10-CM | POA: Diagnosis not present

## 2012-02-17 DIAGNOSIS — M5124 Other intervertebral disc displacement, thoracic region: Secondary | ICD-10-CM | POA: Diagnosis not present

## 2012-02-17 DIAGNOSIS — G9589 Other specified diseases of spinal cord: Secondary | ICD-10-CM | POA: Insufficient documentation

## 2012-02-17 DIAGNOSIS — M545 Low back pain, unspecified: Secondary | ICD-10-CM | POA: Diagnosis not present

## 2012-02-17 DIAGNOSIS — Z79899 Other long term (current) drug therapy: Secondary | ICD-10-CM | POA: Diagnosis not present

## 2012-02-17 DIAGNOSIS — M542 Cervicalgia: Secondary | ICD-10-CM | POA: Insufficient documentation

## 2012-02-17 DIAGNOSIS — G8922 Chronic post-thoracotomy pain: Secondary | ICD-10-CM | POA: Diagnosis not present

## 2012-02-17 LAB — PULMONARY FUNCTION TEST

## 2012-02-17 MED ORDER — GADOBENATE DIMEGLUMINE 529 MG/ML IV SOLN
20.0000 mL | Freq: Once | INTRAVENOUS | Status: AC | PRN
  Administered 2012-02-17: 20 mL via INTRAVENOUS

## 2012-02-23 DIAGNOSIS — G894 Chronic pain syndrome: Secondary | ICD-10-CM | POA: Diagnosis not present

## 2012-02-23 DIAGNOSIS — M4802 Spinal stenosis, cervical region: Secondary | ICD-10-CM | POA: Diagnosis not present

## 2012-02-23 DIAGNOSIS — M5124 Other intervertebral disc displacement, thoracic region: Secondary | ICD-10-CM | POA: Diagnosis not present

## 2012-02-23 DIAGNOSIS — IMO0002 Reserved for concepts with insufficient information to code with codable children: Secondary | ICD-10-CM | POA: Diagnosis not present

## 2012-02-23 DIAGNOSIS — R269 Unspecified abnormalities of gait and mobility: Secondary | ICD-10-CM | POA: Diagnosis not present

## 2012-02-23 DIAGNOSIS — M5030 Other cervical disc degeneration, unspecified cervical region: Secondary | ICD-10-CM | POA: Diagnosis not present

## 2012-02-23 DIAGNOSIS — G9589 Other specified diseases of spinal cord: Secondary | ICD-10-CM | POA: Diagnosis not present

## 2012-02-23 DIAGNOSIS — M961 Postlaminectomy syndrome, not elsewhere classified: Secondary | ICD-10-CM | POA: Diagnosis not present

## 2012-02-25 ENCOUNTER — Other Ambulatory Visit: Payer: No Typology Code available for payment source

## 2012-02-29 DIAGNOSIS — IMO0002 Reserved for concepts with insufficient information to code with codable children: Secondary | ICD-10-CM | POA: Diagnosis not present

## 2012-02-29 DIAGNOSIS — M5137 Other intervertebral disc degeneration, lumbosacral region: Secondary | ICD-10-CM | POA: Diagnosis not present

## 2012-02-29 DIAGNOSIS — G894 Chronic pain syndrome: Secondary | ICD-10-CM | POA: Diagnosis not present

## 2012-02-29 DIAGNOSIS — M961 Postlaminectomy syndrome, not elsewhere classified: Secondary | ICD-10-CM | POA: Diagnosis not present

## 2012-03-01 DIAGNOSIS — R269 Unspecified abnormalities of gait and mobility: Secondary | ICD-10-CM | POA: Diagnosis not present

## 2012-03-01 DIAGNOSIS — R42 Dizziness and giddiness: Secondary | ICD-10-CM | POA: Diagnosis not present

## 2012-03-08 ENCOUNTER — Emergency Department (HOSPITAL_COMMUNITY): Payer: Medicare Other

## 2012-03-08 ENCOUNTER — Encounter (HOSPITAL_COMMUNITY): Payer: Self-pay | Admitting: *Deleted

## 2012-03-08 ENCOUNTER — Emergency Department (HOSPITAL_COMMUNITY)
Admission: EM | Admit: 2012-03-08 | Discharge: 2012-03-09 | Disposition: A | Discharge status: Home / Self Care | Payer: Medicare Other | Attending: Emergency Medicine | Admitting: Emergency Medicine

## 2012-03-08 ENCOUNTER — Other Ambulatory Visit: Payer: Self-pay

## 2012-03-08 DIAGNOSIS — R5381 Other malaise: Secondary | ICD-10-CM | POA: Diagnosis not present

## 2012-03-08 DIAGNOSIS — Z79899 Other long term (current) drug therapy: Secondary | ICD-10-CM | POA: Insufficient documentation

## 2012-03-08 DIAGNOSIS — R209 Unspecified disturbances of skin sensation: Secondary | ICD-10-CM | POA: Insufficient documentation

## 2012-03-08 DIAGNOSIS — R51 Headache: Secondary | ICD-10-CM | POA: Diagnosis not present

## 2012-03-08 DIAGNOSIS — I6350 Cerebral infarction due to unspecified occlusion or stenosis of unspecified cerebral artery: Secondary | ICD-10-CM | POA: Diagnosis not present

## 2012-03-08 DIAGNOSIS — I517 Cardiomegaly: Secondary | ICD-10-CM | POA: Diagnosis not present

## 2012-03-08 DIAGNOSIS — I639 Cerebral infarction, unspecified: Secondary | ICD-10-CM

## 2012-03-08 DIAGNOSIS — E119 Type 2 diabetes mellitus without complications: Secondary | ICD-10-CM | POA: Insufficient documentation

## 2012-03-08 DIAGNOSIS — R200 Anesthesia of skin: Secondary | ICD-10-CM

## 2012-03-08 DIAGNOSIS — R29898 Other symptoms and signs involving the musculoskeletal system: Secondary | ICD-10-CM | POA: Insufficient documentation

## 2012-03-08 DIAGNOSIS — R42 Dizziness and giddiness: Secondary | ICD-10-CM | POA: Diagnosis not present

## 2012-03-08 DIAGNOSIS — R202 Paresthesia of skin: Secondary | ICD-10-CM

## 2012-03-08 DIAGNOSIS — I10 Essential (primary) hypertension: Secondary | ICD-10-CM | POA: Insufficient documentation

## 2012-03-08 DIAGNOSIS — I6789 Other cerebrovascular disease: Secondary | ICD-10-CM | POA: Diagnosis not present

## 2012-03-08 DIAGNOSIS — R5383 Other fatigue: Secondary | ICD-10-CM | POA: Diagnosis not present

## 2012-03-08 DIAGNOSIS — Z7982 Long term (current) use of aspirin: Secondary | ICD-10-CM | POA: Insufficient documentation

## 2012-03-08 HISTORY — DX: Essential (primary) hypertension: I10

## 2012-03-08 HISTORY — DX: Cerebral infarction, unspecified: I63.9

## 2012-03-08 LAB — DIFFERENTIAL
Basophils Absolute: 0 10*3/uL (ref 0.0–0.1)
Basophils Relative: 0 % (ref 0–1)
Eosinophils Absolute: 0.1 10*3/uL (ref 0.0–0.7)
Eosinophils Relative: 2 % (ref 0–5)
Lymphocytes Relative: 19 % (ref 12–46)
Lymphs Abs: 1.3 10*3/uL (ref 0.7–4.0)
Monocytes Absolute: 0.5 10*3/uL (ref 0.1–1.0)
Monocytes Relative: 8 % (ref 3–12)
Neutro Abs: 4.7 10*3/uL (ref 1.7–7.7)
Neutrophils Relative %: 71 % (ref 43–77)

## 2012-03-08 LAB — CBC
HCT: 39.3 % (ref 39.0–52.0)
Hemoglobin: 13.6 g/dL (ref 13.0–17.0)
MCH: 31.6 pg (ref 26.0–34.0)
MCHC: 34.6 g/dL (ref 30.0–36.0)
MCV: 91.2 fL (ref 78.0–100.0)
Platelets: 239 10*3/uL (ref 150–400)
RBC: 4.31 MIL/uL (ref 4.22–5.81)
RDW: 12.8 % (ref 11.5–15.5)
WBC: 6.6 10*3/uL (ref 4.0–10.5)

## 2012-03-08 NOTE — ED Provider Notes (Signed)
History   This chart was scribed for [employer]. Colon Branch, MD by Charolett Bumpers . The patient was seen in room APA04/APA04.    CSN: [id number]  Arrival date & time 03/08/12  2221   First MD Initiated Contact with Patient 03/08/12 2307      Chief Complaint  Patient presents with  . Extremity Weakness    (Consider location/radiation/quality/duration/timing/severity/associated sxs/prior treatment) HPI [name] is a 66 y.o. male who presents to the Emergency Department complaining of constant, moderate extremity weakness with his symptoms worsening tonight. Patient reports that he trouble with gripping today and tingling in his arms. Patient reports associated pain and numbness. Patient reports an associated headache. Wife states that the patient has weakness in his lower extremities and is been followed for that issue by Dr. Phillips Odor. Patient states that he has had 2 MRI in the past 2 weeks, with findings of 3-4 bulging discs which is affecting the tingling in his upper extremities. Patient denies doing any physical therapy currently. Patient states that he has an appointment for a nerve conduction test. Wife reports that the patient has trouble with a ataxic gait. Patient states that he is left-handed. Patient denies using a c-pap or being on oxygen at home.   PCP Dr. Juanetta Gosling Pain Management Dr. Eduard Clos  Past Medical History  Diagnosis Date  . Hypertension   . Diabetes mellitus     Past Surgical History  Procedure Date  . Back surgery   . Cervical fusion     No family history on file.  History  Substance Use Topics  . Smoking status: Never Smoker   . Smokeless tobacco: Not on file  . Alcohol Use: No      Review of Systems A complete 10 system review of systems was obtained and all systems are negative except as noted in the HPI and PMH.   Allergies  Codeine  Home Medications   Current Outpatient Rx  Name Route Sig Dispense Refill  . ALBUTEROL SULFATE HFA  108 (90 BASE) MCG/ACT IN AERS Inhalation Inhale 2 puffs into the lungs every 6 (six) hours as needed.    . ASPIRIN EC 81 MG PO TBEC Oral Take 81 mg by mouth daily.    [name] BENZONATATE 100 MG PO CAPS Oral Take 200 mg by mouth 3 (three) times daily as needed. For cough    . CINNAMON 500 MG PO CAPS Oral Take 1,000 mg by mouth 2 (two) times daily. Cinnamon 1000 mg + Chromium per dose--Two capsules twice daily    . DIAZEPAM 5 MG PO TABS Oral Take 5 mg by mouth at bedtime.    [name] GABAPENTIN 600 MG PO TABS Oral Take 600 mg by mouth 3 (three) times daily.    [name] GLIMEPIRIDE 4 MG PO TABS Oral Take 4 mg by mouth daily before breakfast.    . MSM-GLUCOSAMINE PO Oral Take 1 tablet by mouth 2 (two) times daily. *Glucosamine 1500mg  + MSM 1500mg     . IBUPROFEN 200 MG PO TABS Oral Take 400 mg by mouth 2 (two) times daily.    [name] LINAGLIPTIN 5 MG PO TABS Oral Take 5 mg by mouth daily.    [name] LOSARTAN POTASSIUM-HCTZ 100-12.5 MG PO TABS Oral Take 1 tablet by mouth daily.    [name] MAGNESIUM OXIDE 500 MG (LAX) PO TABS Oral Take 1 tablet by mouth daily.    . OMEGA 3-6-9 COMPLEX PO CAPS Oral Take 1 capsule by mouth 2 (two) times daily.    [name]  OMEPRAZOLE MAGNESIUM 20 MG PO TBEC Oral Take 20 mg by mouth 2 (two) times daily.      BP 160/91  Pulse 70  Temp 98.1 F (36.7 C) (Oral)  Resp 20  Ht 6' (1.829 m)  Wt 255 lb (115.667 kg)  BMI 34.58 kg/m2  SpO2 95%  Physical Exam  Nursing note and vitals reviewed. Constitutional: He is oriented to person, place, and time. He appears well-developed and well-nourished. No distress.  HENT:  Head: Normocephalic and atraumatic.  Mouth/Throat: Oropharynx is clear and moist.  Eyes: Conjunctivae and EOM are normal. Pupils are equal, round, and reactive to light.  Neck: Normal range of motion. Neck supple. No tracheal deviation present.  Cardiovascular: Normal rate, regular rhythm, normal heart sounds and intact distal pulses.  Exam reveals no gallop and no friction rub.   No murmur  heard. Pulmonary/Chest: Effort normal. No respiratory distress. He has no wheezes. He has rales (in bases).  Abdominal: Soft. Bowel sounds are normal. He exhibits no distension. There is no tenderness. There is no rebound and no guarding.  Musculoskeletal: Normal range of motion. He exhibits no edema and no tenderness.       Equal strength.   Neurological: He is alert and oriented to person, place, and time. No cranial nerve deficit or sensory deficit. Coordination normal.  Skin: Skin is warm and dry.  Psychiatric: He has a normal mood and affect. His behavior is normal.    ED Course  Procedures (including critical care time)  DIAGNOSTIC STUDIES: Oxygen Saturation is 95% on [place], adequate by my interpretation.    COORDINATION OF CARE:  2341: Discussed planned course of treatment with the patient, who is agreeable at this time.     Results for orders placed during the hospital encounter of 03/08/12  CBC      Component Value Range   WBC 6.6  4.0 - 10.5 K/uL   RBC 4.31  4.22 - 5.81 MIL/uL   Hemoglobin 13.6  13.0 - 17.0 g/dL   HCT 16.1  09.6 - 04.5 %   MCV 91.2  78.0 - 100.0 fL   MCH 31.6  26.0 - 34.0 pg   MCHC 34.6  30.0 - 36.0 g/dL   RDW 40.9  81.1 - 91.4 %   Platelets 239  150 - 400 K/uL  DIFFERENTIAL      Component Value Range   Neutrophils Relative 71  43 - 77 %   Neutro Abs 4.7  1.7 - 7.7 K/uL   Lymphocytes Relative 19  12 - 46 %   Lymphs Abs 1.3  0.7 - 4.0 K/uL   Monocytes Relative 8  3 - 12 %   Monocytes Absolute 0.5  0.1 - 1.0 K/uL   Eosinophils Relative 2  0 - 5 %   Eosinophils Absolute 0.1  0.0 - 0.7 K/uL   Basophils Relative 0  0 - 1 %   Basophils Absolute 0.0  0.0 - 0.1 K/uL  BASIC METABOLIC PANEL      Component Value Range   Sodium 136  135 - 145 mEq/L   Potassium 4.0  3.5 - 5.1 mEq/L   Chloride 99  96 - 112 mEq/L   CO2 27  19 - 32 mEq/L   Glucose, Bld 147 (*) 70 - 99 mg/dL   BUN 20  6 - 23 mg/dL   Creatinine, Ser 7.82  0.50 - 1.35 mg/dL   Calcium 9.5   8.4 - 95.6 mg/dL   GFR calc  non Af Amer 73 (*) >90 mL/min   GFR calc Af Amer 85 (*) >90 mL/min   Dg Chest 1 View  03/08/2012  *RADIOLOGY REPORT*  Clinical Data: Right extremity weakness and slurred speech. History of diabetes.  CHEST - 1 VIEW  Comparison: Chest radiograph performed 07/02/2011  Findings: The lungs are well-aerated and clear.  There is no evidence of focal opacification, pleural effusion or pneumothorax.  The cardiomediastinal silhouette is mildly enlarged.  No acute osseous abnormalities are seen.  IMPRESSION: Mild cardiomegaly; no acute cardiopulmonary process seen.  Original Report Authenticated By: Tonia Ghent, M.D.   Ct Head Wo Contrast  03/08/2012  *RADIOLOGY REPORT*  Clinical Data: Right-sided weakness.  CT HEAD WITHOUT CONTRAST  Technique:  Contiguous axial images were obtained from the base of the skull through the vertex without contrast.  Comparison: None.  Findings: There is no evidence of acute infarction, mass lesion, or intra- or extra-axial hemorrhage on CT.  Scattered chronic lacunar infarcts are seen within the right basal ganglia.  Mild cerebellar atrophy is noted.  The brainstem and fourth ventricle are within normal limits.  The third and lateral ventricles are unremarkable in appearance.  The cerebral hemispheres are symmetric in appearance, with normal gray- white differentiation.  No mass effect or midline shift is seen.  There is no evidence of fracture; visualized osseous structures are unremarkable in appearance.  The orbits are within normal limits. The paranasal sinuses and mastoid air cells are well-aerated.  No significant soft tissue abnormalities are seen.  IMPRESSION:  1.  No acute intracranial pathology seen on CT. 2.  Scattered chronic lacunar infarcts within the right basal ganglia.  Original Report Authenticated By: Tonia Ghent, M.D.        MDM  Patient with loss of sensation in his fingertips of his left hand which is his dominant hand causing  him to drop several items earlier this evening. He recently has had an MRI done by Dr. Eduard Clos  showing a herniated disc at C3-4. CT does not show any acute intracranial pathology. Labs are essentially unremarkable. Physical exam is unremarkable. Patient is to followup with both Dr. Juanetta Gosling his primary care physician and Dr. Eduard Clos for pain management.Dx testing d/w pt and family.  Questions answered.  Verb understanding, agreeable to d/c home with outpt f/u.Pt stable in ED with no significant deterioration in condition.The patient appears reasonably screened and/or stabilized for discharge and I doubt any other medical condition or other [facility] requiring further screening, evaluation, or treatment in the ED at this time prior to discharge.  I personally performed the services described in this documentation, which was scribed in my presence. The recorded information has been reviewed and considered.   MDM Number of Diagnoses or Management Options   MDM Reviewed: nursing note and vitals Interpretation: labs, x-ray and CT scan         Nicoletta Dress. Colon Branch, MD 03/09/12 1610

## 2012-03-08 NOTE — ED Notes (Signed)
Called to room by pt's family; per family, pt is slurring his speech; pt reports that he feels like he is slurring his speech; speech is clear; pt alert, in no distress; reports feeling "swimmy headed", but reports he believes this to be due to the ride in the ambulance; states he has trouble with motion sickness. Placed on O2 2L/min per [place]; grip strength 5/5 bilaterally; strong dorsi/plantar flexion.

## 2012-03-08 NOTE — ED Notes (Addendum)
Reports right sided weakness "off and on for awhile"; alert, oriented x 4; in no distress; answers questions appropriately; grip strengths 5/5 bilaterally; strong dorsi/plantar flexion. Placed on cardiac monitor, blood pressure monitoring and continuous pulse oximeter.

## 2012-03-09 ENCOUNTER — Inpatient Hospital Stay (HOSPITAL_COMMUNITY)
Admission: EM | Admit: 2012-03-09 | Discharge: 2012-03-14 | DRG: 065 | Disposition: A | Discharge status: Home Health | Payer: Medicare Other | Attending: Pulmonary Disease | Admitting: Pulmonary Disease

## 2012-03-09 ENCOUNTER — Encounter (HOSPITAL_COMMUNITY): Payer: Self-pay | Admitting: Emergency Medicine

## 2012-03-09 DIAGNOSIS — I779 Disorder of arteries and arterioles, unspecified: Secondary | ICD-10-CM | POA: Diagnosis present

## 2012-03-09 DIAGNOSIS — I6330 Cerebral infarction due to thrombosis of unspecified cerebral artery: Secondary | ICD-10-CM | POA: Diagnosis not present

## 2012-03-09 DIAGNOSIS — IMO0001 Reserved for inherently not codable concepts without codable children: Secondary | ICD-10-CM | POA: Diagnosis not present

## 2012-03-09 DIAGNOSIS — I428 Other cardiomyopathies: Secondary | ICD-10-CM | POA: Diagnosis present

## 2012-03-09 DIAGNOSIS — Z79899 Other long term (current) drug therapy: Secondary | ICD-10-CM

## 2012-03-09 DIAGNOSIS — Z23 Encounter for immunization: Secondary | ICD-10-CM | POA: Diagnosis not present

## 2012-03-09 DIAGNOSIS — R42 Dizziness and giddiness: Secondary | ICD-10-CM | POA: Diagnosis not present

## 2012-03-09 DIAGNOSIS — Z7982 Long term (current) use of aspirin: Secondary | ICD-10-CM

## 2012-03-09 DIAGNOSIS — R209 Unspecified disturbances of skin sensation: Secondary | ICD-10-CM | POA: Diagnosis not present

## 2012-03-09 DIAGNOSIS — G629 Polyneuropathy, unspecified: Secondary | ICD-10-CM | POA: Diagnosis present

## 2012-03-09 DIAGNOSIS — E669 Obesity, unspecified: Secondary | ICD-10-CM | POA: Diagnosis present

## 2012-03-09 DIAGNOSIS — G609 Hereditary and idiopathic neuropathy, unspecified: Secondary | ICD-10-CM | POA: Diagnosis present

## 2012-03-09 DIAGNOSIS — I6789 Other cerebrovascular disease: Secondary | ICD-10-CM | POA: Diagnosis not present

## 2012-03-09 DIAGNOSIS — I10 Essential (primary) hypertension: Secondary | ICD-10-CM | POA: Diagnosis not present

## 2012-03-09 DIAGNOSIS — Z6834 Body mass index (BMI) 34.0-34.9, adult: Secondary | ICD-10-CM | POA: Diagnosis not present

## 2012-03-09 DIAGNOSIS — I429 Cardiomyopathy, unspecified: Secondary | ICD-10-CM | POA: Diagnosis present

## 2012-03-09 DIAGNOSIS — I6350 Cerebral infarction due to unspecified occlusion or stenosis of unspecified cerebral artery: Secondary | ICD-10-CM | POA: Diagnosis not present

## 2012-03-09 DIAGNOSIS — Z8673 Personal history of transient ischemic attack (TIA), and cerebral infarction without residual deficits: Secondary | ICD-10-CM | POA: Diagnosis not present

## 2012-03-09 DIAGNOSIS — E119 Type 2 diabetes mellitus without complications: Secondary | ICD-10-CM | POA: Diagnosis not present

## 2012-03-09 DIAGNOSIS — I517 Cardiomegaly: Secondary | ICD-10-CM | POA: Diagnosis not present

## 2012-03-09 DIAGNOSIS — I658 Occlusion and stenosis of other precerebral arteries: Secondary | ICD-10-CM | POA: Diagnosis not present

## 2012-03-09 DIAGNOSIS — G8190 Hemiplegia, unspecified affecting unspecified side: Secondary | ICD-10-CM | POA: Diagnosis present

## 2012-03-09 DIAGNOSIS — R2990 Unspecified symptoms and signs involving the nervous system: Secondary | ICD-10-CM

## 2012-03-09 DIAGNOSIS — R5381 Other malaise: Secondary | ICD-10-CM | POA: Diagnosis not present

## 2012-03-09 DIAGNOSIS — R51 Headache: Secondary | ICD-10-CM | POA: Diagnosis not present

## 2012-03-09 DIAGNOSIS — E1159 Type 2 diabetes mellitus with other circulatory complications: Secondary | ICD-10-CM | POA: Diagnosis present

## 2012-03-09 LAB — BASIC METABOLIC PANEL
BUN: 20 mg/dL (ref 6–23)
BUN: 24 mg/dL — ABNORMAL HIGH (ref 6–23)
CO2: 27 mEq/L (ref 19–32)
CO2: 26 mEq/L (ref 19–32)
Calcium: 9.5 mg/dL (ref 8.4–10.5)
Calcium: 9.6 mg/dL (ref 8.4–10.5)
Chloride: 99 mEq/L (ref 96–112)
Chloride: 100 mEq/L (ref 96–112)
Creatinine, Ser: 1.02 mg/dL (ref 0.50–1.35)
Creatinine, Ser: 1.12 mg/dL (ref 0.50–1.35)
GFR calc Af Amer: 85 mL/min — ABNORMAL LOW (ref >90)
GFR calc Af Amer: 76 mL/min — ABNORMAL LOW (ref >90)
GFR calc non Af Amer: 73 mL/min — ABNORMAL LOW (ref >90)
GFR calc non Af Amer: 66 mL/min — ABNORMAL LOW (ref >90)
Glucose, Bld: 147 mg/dL — ABNORMAL HIGH (ref 70–99)
Glucose, Bld: 222 mg/dL — ABNORMAL HIGH (ref 70–99)
Potassium: 4 mEq/L (ref 3.5–5.1)
Potassium: 3.8 mEq/L (ref 3.5–5.1)
Sodium: 136 mEq/L (ref 135–145)
Sodium: 137 mEq/L (ref 135–145)

## 2012-03-09 LAB — GLUCOSE, CAPILLARY: Glucose-Capillary: 209 mg/dL — ABNORMAL HIGH (ref 70–99)

## 2012-03-09 LAB — CBC
HCT: 39.5 % (ref 39.0–52.0)
Hemoglobin: 13.7 g/dL (ref 13.0–17.0)
MCH: 31.8 pg (ref 26.0–34.0)
MCHC: 34.7 g/dL (ref 30.0–36.0)
MCV: 91.6 fL (ref 78.0–100.0)
Platelets: 221 10*3/uL (ref 150–400)
RBC: 4.31 MIL/uL (ref 4.22–5.81)
RDW: 12.9 % (ref 11.5–15.5)
WBC: 7.8 10*3/uL (ref 4.0–10.5)

## 2012-03-09 LAB — URINALYSIS, ROUTINE W REFLEX MICROSCOPIC
Bilirubin Urine: NEGATIVE
Glucose, UA: 100 mg/dL — AB
Hgb urine dipstick: NEGATIVE
Ketones, ur: NEGATIVE mg/dL
Leukocytes, UA: NEGATIVE
Nitrite: NEGATIVE
Protein, ur: NEGATIVE mg/dL
Specific Gravity, Urine: >1.03 — ABNORMAL HIGH (ref 1.005–1.030)
Urobilinogen, UA: 0.2 mg/dL (ref 0.0–1.0)
pH: 5.5 (ref 5.0–8.0)

## 2012-03-09 LAB — DIFFERENTIAL
Basophils Absolute: 0 10*3/uL (ref 0.0–0.1)
Basophils Relative: 0 % (ref 0–1)
Eosinophils Absolute: 0.1 10*3/uL (ref 0.0–0.7)
Eosinophils Relative: 1 % (ref 0–5)
Lymphocytes Relative: 15 % (ref 12–46)
Lymphs Abs: 1.1 10*3/uL (ref 0.7–4.0)
Monocytes Absolute: 0.6 10*3/uL (ref 0.1–1.0)
Monocytes Relative: 7 % (ref 3–12)
Neutro Abs: 6 10*3/uL (ref 1.7–7.7)
Neutrophils Relative %: 77 % (ref 43–77)

## 2012-03-09 MED ORDER — HYDROCHLOROTHIAZIDE 12.5 MG PO CAPS
12.5000 mg | ORAL_CAPSULE | Freq: Every day | ORAL | Status: DC
  Administered 2012-03-10 – 2012-03-13 (×4): 12.5 mg via ORAL
  Filled 2012-03-09 (×5): qty 1

## 2012-03-09 MED ORDER — LINAGLIPTIN 5 MG PO TABS
5.0000 mg | ORAL_TABLET | Freq: Every day | ORAL | Status: DC
  Administered 2012-03-10 – 2012-03-13 (×4): 5 mg via ORAL
  Filled 2012-03-09 (×5): qty 1

## 2012-03-09 MED ORDER — DIAZEPAM 5 MG PO TABS
5.0000 mg | ORAL_TABLET | Freq: Every day | ORAL | Status: DC
  Administered 2012-03-09 – 2012-03-13 (×5): 5 mg via ORAL
  Filled 2012-03-09 (×6): qty 1

## 2012-03-09 MED ORDER — LOSARTAN POTASSIUM-HCTZ 100-12.5 MG PO TABS: 1.0000 | ORAL_TABLET | Freq: Every day | ORAL | Status: DC

## 2012-03-09 MED ORDER — IBUPROFEN 800 MG PO TABS
400.0000 mg | ORAL_TABLET | Freq: Two times a day (BID) | ORAL | Status: DC
  Administered 2012-03-10 – 2012-03-14 (×9): 400 mg via ORAL
  Filled 2012-03-09: qty 2
  Filled 2012-03-09 (×2): qty 1
  Filled 2012-03-09: qty 2
  Filled 2012-03-09 (×3): qty 1
  Filled 2012-03-09: qty 2
  Filled 2012-03-09: qty 1

## 2012-03-09 MED ORDER — PNEUMOCOCCAL VAC POLYVALENT 25 MCG/0.5ML IJ INJ
0.5000 mL | INJECTION | INTRAMUSCULAR | Status: AC
  Administered 2012-03-10: 0.5 mL via INTRAMUSCULAR
  Filled 2012-03-09: qty 0.5

## 2012-03-09 MED ORDER — PANTOPRAZOLE SODIUM 40 MG PO TBEC
40.0000 mg | DELAYED_RELEASE_TABLET | Freq: Two times a day (BID) | ORAL | Status: DC
  Administered 2012-03-10 – 2012-03-14 (×9): 40 mg via ORAL
  Filled 2012-03-09 (×9): qty 1

## 2012-03-09 MED ORDER — ENOXAPARIN SODIUM 40 MG/0.4ML ~~LOC~~ SOLN
40.0000 mg | SUBCUTANEOUS | Status: DC
  Administered 2012-03-09 – 2012-03-13 (×5): 40 mg via SUBCUTANEOUS
  Filled 2012-03-09 (×4): qty 0.4

## 2012-03-09 MED ORDER — SODIUM CHLORIDE 0.9 % IV SOLN: INTRAVENOUS | Status: AC

## 2012-03-09 MED ORDER — BENZONATATE 100 MG PO CAPS: 200.0000 mg | ORAL_CAPSULE | Freq: Three times a day (TID) | ORAL | Status: DC | PRN

## 2012-03-09 MED ORDER — GABAPENTIN 600 MG PO TABS
600.0000 mg | ORAL_TABLET | Freq: Three times a day (TID) | ORAL | Status: DC
  Filled 2012-03-09 (×2): qty 1

## 2012-03-09 MED ORDER — GLIMEPIRIDE 2 MG PO TABS
4.0000 mg | ORAL_TABLET | Freq: Every day | ORAL | Status: DC
  Administered 2012-03-10 – 2012-03-14 (×5): 4 mg via ORAL
  Filled 2012-03-09 (×6): qty 2

## 2012-03-09 MED ORDER — ALBUTEROL SULFATE HFA 108 (90 BASE) MCG/ACT IN AERS: 2.0000 | INHALATION_SPRAY | Freq: Four times a day (QID) | RESPIRATORY_TRACT | Status: DC | PRN

## 2012-03-09 MED ORDER — ONDANSETRON HCL 4 MG/2ML IJ SOLN: 4.0000 mg | Freq: Three times a day (TID) | INTRAMUSCULAR | Status: AC | PRN

## 2012-03-09 MED ORDER — ASPIRIN EC 81 MG PO TBEC
81.0000 mg | DELAYED_RELEASE_TABLET | Freq: Every day | ORAL | Status: DC
  Administered 2012-03-10 – 2012-03-14 (×5): 81 mg via ORAL
  Filled 2012-03-09 (×5): qty 1

## 2012-03-09 MED ORDER — LOSARTAN POTASSIUM 50 MG PO TABS
100.0000 mg | ORAL_TABLET | Freq: Every day | ORAL | Status: DC
  Administered 2012-03-10 – 2012-03-14 (×5): 100 mg via ORAL
  Filled 2012-03-09 (×5): qty 2

## 2012-03-09 MED ORDER — OMEPRAZOLE MAGNESIUM 20 MG PO TBEC: 20.0000 mg | DELAYED_RELEASE_TABLET | Freq: Two times a day (BID) | ORAL | Status: DC

## 2012-03-09 NOTE — ED Notes (Signed)
Pt presents with L sides weakness and dizziness. States he was seen here last night for the same symptoms and they have since gotten worse. States he is having trouble walking and is very weak in his left side arm and leg. Pt is AAx4 NAD noted. Also notes some coughing and congestion.

## 2012-03-09 NOTE — ED Notes (Signed)
Left in c/o family for transport home; pt ambulatory with steady gait; denies pain; a&ox4; answers questions appropriately; speech clear. Instructions reviewed and f/u information provided.

## 2012-03-09 NOTE — Discharge Instructions (Signed)
Your blood work, chest xray and CT of your brain were normal here tonight. There was no evidence of a stroke. If you continue to have numbness and tingling, your doctor may want to have an MRI of the brain done. Follow up with both Dr. Juanetta Gosling and Dr. Eduard Clos.

## 2012-03-09 NOTE — ED Notes (Signed)
C/o increased numbness and weakness left arm

## 2012-03-09 NOTE — ED Provider Notes (Signed)
History  Scribed for Dayton Bailiff, MD, the patient was seen in room [facility]. This chart was scribed by Candelaria Stagers. The patient's care started at 8:03 PM   CSN: [id number]  Arrival date & time 03/09/12  1944   First MD Initiated Contact with Patient 03/09/12 1957      Chief Complaint  Patient presents with  . Extremity Weakness  . Dizziness     The history is provided by the patient.   [name] is a 66 y.o. male who presents to the Emergency Department complaining of of left sided extremity weakness that started yesterday.  Pt was seen in the ED for similar sx last night and states that the sx have gotten worse today.  He states that he is having trouble walking.  He denies difficulty with urination or bowel movements.  He has h/o of upper and lower spine surgery.      PCP is Dr. Juanetta Gosling Past Medical History  Diagnosis Date  . Hypertension   . Diabetes mellitus     Past Surgical History  Procedure Date  . Back surgery   . Cervical fusion   . Foot surgery     History reviewed. No pertinent family history.  History  Substance Use Topics  . Smoking status: Never Smoker   . Smokeless tobacco: Not on file  . Alcohol Use: No      Review of Systems  Constitutional: Negative for fever, activity change and appetite change.  HENT: Negative for neck pain and neck stiffness.   Eyes: Negative for discharge and itching.  Respiratory: Negative for cough and shortness of breath.   Cardiovascular: Negative for chest pain and leg swelling.  Gastrointestinal: Negative for nausea, vomiting, abdominal pain and diarrhea.  Genitourinary: Negative for dysuria.  Musculoskeletal: Positive for gait problem.  Skin: Negative for rash.  Neurological: Positive for weakness (left sided extremity weakness).    Allergies  Codeine  Home Medications   Current Outpatient Rx  Name Route Sig Dispense Refill  . ALBUTEROL SULFATE HFA 108 (90 BASE) MCG/ACT IN AERS Inhalation  Inhale 2 puffs into the lungs every 6 (six) hours as needed.    . ASPIRIN EC 81 MG PO TBEC Oral Take 81 mg by mouth daily.    [name] BENZONATATE 100 MG PO CAPS Oral Take 200 mg by mouth 3 (three) times daily as needed. For cough    . CINNAMON 500 MG PO CAPS Oral Take 1,000 mg by mouth 2 (two) times daily. Cinnamon 1000 mg + Chromium per dose--Two capsules twice daily    . DIAZEPAM 5 MG PO TABS Oral Take 5 mg by mouth at bedtime.    [name] GABAPENTIN 600 MG PO TABS Oral Take 600 mg by mouth 3 (three) times daily.    [name] GLIMEPIRIDE 4 MG PO TABS Oral Take 4 mg by mouth daily before breakfast.    . MSM-GLUCOSAMINE PO Oral Take 1 tablet by mouth 2 (two) times daily. *Glucosamine 1500mg  + MSM 1500mg     . IBUPROFEN 200 MG PO TABS Oral Take 400 mg by mouth 2 (two) times daily.    [name] LINAGLIPTIN 5 MG PO TABS Oral Take 5 mg by mouth daily.    [name] LOSARTAN POTASSIUM-HCTZ 100-12.5 MG PO TABS Oral Take 1 tablet by mouth daily.    [name] MAGNESIUM OXIDE 500 MG (LAX) PO TABS Oral Take 1 tablet by mouth daily.    . OMEGA 3-6-9 COMPLEX PO CAPS Oral Take 1 capsule by mouth  2 (two) times daily.    [name] OMEPRAZOLE MAGNESIUM 20 MG PO TBEC Oral Take 20 mg by mouth 2 (two) times daily.      BP 139/67  Pulse 79  Temp 97.4 F (36.3 C) (Oral)  Resp 16  Ht 6' (1.829 m)  Wt 255 lb (115.667 kg)  BMI 34.58 kg/m2  SpO2 96%  Physical Exam  Nursing note and vitals reviewed. Constitutional: He is oriented to person, place, and time. He appears well-developed and well-nourished. No distress.  HENT:  Head: Normocephalic and atraumatic.  Eyes: EOM are normal. Pupils are equal, round, and reactive to light.  Neck: Neck supple. No tracheal deviation present.  Cardiovascular: Normal rate.   Pulmonary/Chest: Effort normal. No respiratory distress.  Abdominal: Soft. He exhibits no distension.  Musculoskeletal: Normal range of motion. He exhibits no edema.       Left side weakness grip strength, 3-4/5 on left upper and lower extremities    Neurological: He is alert and oriented to person, place, and time. No sensory deficit.  Skin: Skin is warm and dry.  Psychiatric: He has a normal mood and affect. His behavior is normal.    ED Course  Procedures  DIAGNOSTIC STUDIES: Oxygen Saturation is 96% on room air, normal by my interpretation.    COORDINATION OF CARE:  8:08PM Ordered: CBC; Differential; Basic metabolic panel; Urinalysis, Routine w reflex microscopic    Labs Reviewed  CBC  DIFFERENTIAL  BASIC METABOLIC PANEL  URINALYSIS, ROUTINE W REFLEX MICROSCOPIC   Dg Chest 1 View  03/08/2012  *RADIOLOGY REPORT*  Clinical Data: Right extremity weakness and slurred speech. History of diabetes.  CHEST - 1 VIEW  Comparison: Chest radiograph performed 07/02/2011  Findings: The lungs are well-aerated and clear.  There is no evidence of focal opacification, pleural effusion or pneumothorax.  The cardiomediastinal silhouette is mildly enlarged.  No acute osseous abnormalities are seen.  IMPRESSION: Mild cardiomegaly; no acute cardiopulmonary process seen.  Original Report Authenticated By: Tonia Ghent, M.D.   Ct Head Wo Contrast  03/08/2012  *RADIOLOGY REPORT*  Clinical Data: Right-sided weakness.  CT HEAD WITHOUT CONTRAST  Technique:  Contiguous axial images were obtained from the base of the skull through the vertex without contrast.  Comparison: None.  Findings: There is no evidence of acute infarction, mass lesion, or intra- or extra-axial hemorrhage on CT.  Scattered chronic lacunar infarcts are seen within the right basal ganglia.  Mild cerebellar atrophy is noted.  The brainstem and fourth ventricle are within normal limits.  The third and lateral ventricles are unremarkable in appearance.  The cerebral hemispheres are symmetric in appearance, with normal gray- white differentiation.  No mass effect or midline shift is seen.  There is no evidence of fracture; visualized osseous structures are unremarkable in appearance.  The  orbits are within normal limits. The paranasal sinuses and mastoid air cells are well-aerated.  No significant soft tissue abnormalities are seen.  IMPRESSION:  1.  No acute intracranial pathology seen on CT. 2.  Scattered chronic lacunar infarcts within the right basal ganglia.  Original Report Authenticated By: Tonia Ghent, M.D.     1. Stroke-like symptoms       MDM  Left-sided weakness. Etiologies to consider are cervical pathology versus CVA. Will require admission for MRI of the brain and cervical spine. He will also require carotid Dopplers. Patient does have objective weakness on physical examination. He has had difficulty walking. CT was not repeated. Discussed with the patient's primary care physician  who will admit the patient for further evaluation.  I personally performed the services described in this documentation, which was scribed in my presence. The recorded information has been reviewed and considered.       Dayton Bailiff, MD 03/09/12 2014

## 2012-03-10 ENCOUNTER — Inpatient Hospital Stay (HOSPITAL_COMMUNITY): Payer: Medicare Other

## 2012-03-10 LAB — GLUCOSE, CAPILLARY: Glucose-Capillary: 146 mg/dL — ABNORMAL HIGH (ref 70–99)

## 2012-03-10 MED ORDER — GABAPENTIN 300 MG PO CAPS
600.0000 mg | ORAL_CAPSULE | Freq: Three times a day (TID) | ORAL | Status: DC
  Administered 2012-03-10 – 2012-03-14 (×13): 600 mg via ORAL
  Filled 2012-03-10 (×13): qty 2

## 2012-03-10 NOTE — Progress Notes (Signed)
UR Chart Review Completed  

## 2012-03-10 NOTE — Care Management Note (Signed)
    Page 1 of 2   03/14/2012     9:11:08 AM   CARE MANAGEMENT NOTE 03/14/2012  Patient:  [name], [name]   Account Number:  [id number]  Date Initiated:  03/10/2012  Documentation initiated by:  Sharrie Rothman  Subjective/Objective Assessment:   Pt admitted from home with possible CVA with left sided weakness. Pt lives with wife and will return home at discharge. Pt was independent with ADL's prior to admission.     Action/Plan:   Pt may benefit from PT at discharge. Pt will be evaluated by PT dept on 03/11/12.   Anticipated DC Date:  03/12/2012   Anticipated DC Plan:  HOME W HOME HEALTH SERVICES  In-house referral  Clinical Social Worker      DC Planning Services  CM consult      [facility] Choice  HOME HEALTH   Choice offered to / List presented to:  C-1 Patient        HH arranged  HH-2 PT  HH-1 RN      [facility] agency  [facility]   Status of service:  Completed, signed off Medicare Important Message given?   (If response is "NO", the following Medicare IM given date fields will be blank) Date Medicare IM given:   Date Additional Medicare IM given:    Discharge Disposition:  HOME W HOME HEALTH SERVICES  Per UR Regulation:    If discussed at Long Length of Stay Meetings, dates discussed:    Comments:  03/14/12 0905 Arlyss Queen, RN BSN CM Pt discharged home today with [facility] RN and PT. Alroy Bailiff of [facility] is aware and will collect the pts information from the chart. No DMe needs noted. Pt stated he had cane and walker for home use. HH services will start within 48 hours of discharge. 03/13/12 1110 Arlyss Queen, RN BSN CM Pt has chosen [facility] for PT. Due to pts chronic back issues and weakness due to new CVA, it would be easier to do [facility] PT. Pt has cane and walker at home. No other DME needs noted. Pt was offered SNF but PT recommends outpt PT. 03/10/12 1450 Arlyss Queen, RN BSN CM

## 2012-03-10 NOTE — Consult Note (Signed)
Reason for Consult: Referring Physician:   RASHEEM [name] is an 66 y.o. male.  HPI:   Past Medical History  Diagnosis Date  . Hypertension   . Diabetes mellitus     Past Surgical History  Procedure Date  . Back surgery   . Cervical fusion   . Foot surgery     History reviewed. No pertinent family history.  Social History:  reports that he has never smoked. He does not have any smokeless tobacco history on file. He reports that he does not drink alcohol or use illicit drugs.  Allergies:  Allergies  Allergen Reactions  . Codeine     Medications:  Prior to Admission medications   Medication Sig Start Date End Date Taking? Authorizing Provider  albuterol (PROAIR HFA) 108 (90 BASE) MCG/ACT inhaler Inhale 2 puffs into the lungs every 6 (six) hours as needed.    Historical Provider, MD  aspirin EC 81 MG tablet Take 81 mg by mouth daily.    Historical Provider, MD  benzonatate (TESSALON) 100 MG capsule Take 200 mg by mouth 3 (three) times daily as needed. For cough    Historical Provider, MD  Cinnamon 500 MG capsule Take 1,000 mg by mouth 2 (two) times daily. Cinnamon 1000 mg + Chromium per dose--Two capsules twice daily    Historical Provider, MD  diazepam (VALIUM) 5 MG tablet Take 5 mg by mouth at bedtime.    Historical Provider, MD  gabapentin (NEURONTIN) 600 MG tablet Take 600 mg by mouth 3 (three) times daily.    Historical Provider, MD  glimepiride (AMARYL) 4 MG tablet Take 4 mg by mouth daily before breakfast.    Historical Provider, MD  Glucosamine Sulfate-MSM (MSM-GLUCOSAMINE PO) Take 1 tablet by mouth 2 (two) times daily. *Glucosamine 1500mg  + MSM 1500mg     Historical Provider, MD  ibuprofen (ADVIL,MOTRIN) 200 MG tablet Take 400 mg by mouth 2 (two) times daily.    Historical Provider, MD  linagliptin (TRADJENTA) 5 MG TABS tablet Take 5 mg by mouth daily.    Historical Provider, MD  losartan-hydrochlorothiazide (HYZAAR) 100-12.5 MG per tablet Take 1 tablet by mouth  daily.    Historical Provider, MD  Magnesium Oxide (PHILLIPS) 500 MG (LAX) TABS Take 1 tablet by mouth daily.    Historical Provider, MD  Omega 3-6-9 Fatty Acids (OMEGA 3-6-9 COMPLEX) CAPS Take 1 capsule by mouth 2 (two) times daily.    Historical Provider, MD  omeprazole (PRILOSEC OTC) 20 MG tablet Take 20 mg by mouth 2 (two) times daily.    Historical Provider, MD     Scheduled Meds:   . sodium chloride   Intravenous STAT  . aspirin EC  81 mg Oral Daily  . diazepam  5 mg Oral QHS  . enoxaparin  40 mg Subcutaneous Q24H  . gabapentin  600 mg Oral TID  . glimepiride  4 mg Oral QAC breakfast  . losartan  100 mg Oral Daily   And  . hydrochlorothiazide  12.5 mg Oral Daily  . ibuprofen  400 mg Oral BID  . linagliptin  5 mg Oral Daily  . pantoprazole  40 mg Oral BID AC  . pneumococcal 23 valent vaccine  0.5 mL Intramuscular Tomorrow-1000  . DISCONTD: gabapentin  600 mg Oral TID  . DISCONTD: losartan-hydrochlorothiazide  1 tablet Oral Daily  . DISCONTD: omeprazole  20 mg Oral BID   Continuous Infusions:  PRN Meds:.albuterol, benzonatate, ondansetron (ZOFRAN) IV   Results for orders placed during the  hospital encounter of 03/09/12 (from the past 48 hour(s))  CBC     Status: Normal   Collection Time   03/09/12  8:16 PM      Component Value Range Comment   WBC 7.8  4.0 - 10.5 K/uL    RBC 4.31  4.22 - 5.81 MIL/uL    Hemoglobin 13.7  13.0 - 17.0 g/dL    HCT 40.9  81.1 - 91.4 %    MCV 91.6  78.0 - 100.0 fL    MCH 31.8  26.0 - 34.0 pg    MCHC 34.7  30.0 - 36.0 g/dL    RDW 78.2  95.6 - 21.3 %    Platelets 221  150 - 400 K/uL   DIFFERENTIAL     Status: Normal   Collection Time   03/09/12  8:16 PM      Component Value Range Comment   Neutrophils Relative 77  43 - 77 %    Neutro Abs 6.0  1.7 - 7.7 K/uL    Lymphocytes Relative 15  12 - 46 %    Lymphs Abs 1.1  0.7 - 4.0 K/uL    Monocytes Relative 7  3 - 12 %    Monocytes Absolute 0.6  0.1 - 1.0 K/uL    Eosinophils Relative 1  0 - 5 %     Eosinophils Absolute 0.1  0.0 - 0.7 K/uL    Basophils Relative 0  0 - 1 %    Basophils Absolute 0.0  0.0 - 0.1 K/uL   BASIC METABOLIC PANEL     Status: Abnormal   Collection Time   03/09/12  8:16 PM      Component Value Range Comment   Sodium 137  135 - 145 mEq/L    Potassium 3.8  3.5 - 5.1 mEq/L    Chloride 100  96 - 112 mEq/L    CO2 26  19 - 32 mEq/L    Glucose, Bld 222 (*) 70 - 99 mg/dL    BUN 24 (*) 6 - 23 mg/dL    Creatinine, Ser 0.86  0.50 - 1.35 mg/dL    Calcium 9.6  8.4 - 57.8 mg/dL    GFR calc non Af Amer 66 (*) >90 mL/min    GFR calc Af Amer 76 (*) >90 mL/min   URINALYSIS, ROUTINE W REFLEX MICROSCOPIC     Status: Abnormal   Collection Time   03/09/12  8:59 PM      Component Value Range Comment   Color, Urine AMBER (*) YELLOW BIOCHEMICALS MAY BE AFFECTED BY COLOR   APPearance CLEAR  CLEAR    Specific Gravity, Urine >1.030 (*) 1.005 - 1.030    pH 5.5  5.0 - 8.0    Glucose, UA 100 (*) NEGATIVE mg/dL    Hgb urine dipstick NEGATIVE  NEGATIVE    Bilirubin Urine NEGATIVE  NEGATIVE    Ketones, ur NEGATIVE  NEGATIVE mg/dL    Protein, ur NEGATIVE  NEGATIVE mg/dL    Urobilinogen, UA 0.2  0.0 - 1.0 mg/dL    Nitrite NEGATIVE  NEGATIVE    Leukocytes, UA NEGATIVE  NEGATIVE MICROSCOPIC NOT DONE ON URINES WITH NEGATIVE PROTEIN, BLOOD, LEUKOCYTES, NITRITE, OR GLUCOSE <1000 mg/dL.  GLUCOSE, CAPILLARY     Status: Abnormal   Collection Time   03/09/12  9:19 PM      Component Value Range Comment   Glucose-Capillary 209 (*) 70 - 99 mg/dL    Comment 1 Notify RN  GLUCOSE, CAPILLARY     Status: Abnormal   Collection Time   03/10/12  2:32 PM      Component Value Range Comment   Glucose-Capillary 146 (*) 70 - 99 mg/dL    Comment 1 Notify RN       Dg Chest 1 View  03/08/2012  *RADIOLOGY REPORT*  Clinical Data: Right extremity weakness and slurred speech. History of diabetes.  CHEST - 1 VIEW  Comparison: Chest radiograph performed 07/02/2011  Findings: The lungs are well-aerated and  clear.  There is no evidence of focal opacification, pleural effusion or pneumothorax.  The cardiomediastinal silhouette is mildly enlarged.  No acute osseous abnormalities are seen.  IMPRESSION: Mild cardiomegaly; no acute cardiopulmonary process seen.  Original Report Authenticated By: Tonia Ghent, M.D.   Ct Head Wo Contrast  03/08/2012  *RADIOLOGY REPORT*  Clinical Data: Right-sided weakness.  CT HEAD WITHOUT CONTRAST  Technique:  Contiguous axial images were obtained from the base of the skull through the vertex without contrast.  Comparison: None.  Findings: There is no evidence of acute infarction, mass lesion, or intra- or extra-axial hemorrhage on CT.  Scattered chronic lacunar infarcts are seen within the right basal ganglia.  Mild cerebellar atrophy is noted.  The brainstem and fourth ventricle are within normal limits.  The third and lateral ventricles are unremarkable in appearance.  The cerebral hemispheres are symmetric in appearance, with normal gray- white differentiation.  No mass effect or midline shift is seen.  There is no evidence of fracture; visualized osseous structures are unremarkable in appearance.  The orbits are within normal limits. The paranasal sinuses and mastoid air cells are well-aerated.  No significant soft tissue abnormalities are seen.  IMPRESSION:  1.  No acute intracranial pathology seen on CT. 2.  Scattered chronic lacunar infarcts within the right basal ganglia.  Original Report Authenticated By: Tonia Ghent, M.D.   Mr Brain Wo Contrast  03/10/2012  *RADIOLOGY REPORT*  Clinical Data: Left-sided weakness  MRI HEAD WITHOUT CONTRAST  Technique:  Multiplanar, multiecho pulse sequences of the brain and surrounding structures were obtained according to standard protocol without intravenous contrast.  Comparison: CT 03/08/2012  Findings: Scattered small areas of restricted diffusion in the centrum semiovale on the right compatible with acute white matter infarction.   Chronic ischemic changes are present in the white matter and basal ganglia.  Chronic ischemia in the pons.  Negative for hemorrhage.  No mass or edema is present.  Vessels at the base of the brain are patent.  Mild chronic sinusitis.  IMPRESSION: Scattered small areas of acute infarction in the deep white matter on the right.  Chronic microvascular ischemia.  Original Report Authenticated By: Camelia Phenes, M.D.   US Carotid Duplex Bilateral  03/10/2012  *RADIOLOGY REPORT*  Clinical Data: Left-sided weakness  BILATERAL CAROTID DUPLEX ULTRASOUND  Technique: Wallace Cullens scale imaging, color Doppler and duplex ultrasound was performed of bilateral carotid and vertebral arteries in the neck.  Comparison:  None.  Criteria:  Quantification of carotid stenosis is based on velocity parameters that correlate the residual internal carotid diameter with NASCET-based stenosis levels, using the diameter of the distal internal carotid lumen as the denominator for stenosis measurement.  The following velocity measurements were obtained:                   PEAK SYSTOLIC/END DIASTOLIC RIGHT ICA:  98cm/sec CCA:                        91cm/sec SYSTOLIC ICA/CCA RATIO:     1.08 DIASTOLIC ICA/CCA RATIO: ECA:                        93cm/sec  LEFT ICA:                        255cm/sec CCA:                        89cm/sec SYSTOLIC ICA/CCA RATIO:     2.85 DIASTOLIC ICA/CCA RATIO: ECA:                        104cm/sec  Findings:  RIGHT CAROTID ARTERY: Mild smooth plaque in the common and internal carotid arteries as well as the bulb.  Low resistance internal carotid Doppler pattern.  RIGHT VERTEBRAL ARTERY:  Antegrade.  LEFT CAROTID ARTERY: Mild smooth plaque in the common carotid and bulb.  There is irregular focal plaque in the mid internal carotid. Low resistance internal carotid Doppler pattern.  LEFT VERTEBRAL ARTERY:  Antegrade.  IMPRESSION: Less than 50% stenosis in the right internal carotid artery with mild smooth  plaque.  70-90% stenosis in the left internal carotid artery based on the peak systolic velocity measurement.  Original Report Authenticated By: Donavan Burnet, M.D.    Review of Systems  Constitutional: Negative.   HENT: Negative.   Eyes: Negative.   Respiratory: Negative.   Cardiovascular: Negative.   Gastrointestinal: Negative.   Genitourinary: Negative.   Skin: Negative.   Psychiatric/Behavioral: Negative.    Blood pressure 128/75, pulse 66, temperature 98 F (36.7 C), temperature source Oral, resp. rate 18, height 6' (1.829 m), weight 115.667 kg (255 lb), SpO2 91.00%. Physical Exam  Assessment/Plan: See dict  Amyre Segundo 03/10/2012, 9:29 PM

## 2012-03-10 NOTE — Progress Notes (Signed)
[name], [name]              ACCOUNT NO.:  [id number]  MEDICAL RECORD NO.:  [id number]  LOCATION:  A327                          FACILITY:  [facility]  PHYSICIAN:  Haylyn Halberg D. [name], MD   DATE OF BIRTH:  [birth year]  DATE OF PROCEDURE:  03/10/2012 DATE OF DISCHARGE:                                PROGRESS NOTE   SUBJECTIVE:  The patient complains of left-sided weakness.  No headaches or chest pain.  OBJECTIVE:  GENERAL:  The patient is alert, awake, and sick looking. VITAL SIGNS:  Blood pressure 133/80, pulse 67, respiratory rate 16, temperature 97 degrees Fahrenheit. CHEST:  Decreased air entry, few rhonchi. CARDIOVASCULAR SYSTEM:  First and second heart sounds heard.  No murmur. No gallop. ABDOMEN:  Soft and lax.  Bowel sound is positive.  No mass or organomegaly. EXTREMITIES:  No leg edema.  ASSESSMENT: 1. Probably cerebrovascular accident with left-sided hemiplegia. 2. Hypertension. 3. Diabetes mellitus.  PLAN:  We will go ahead and do MRI of the brain.  We will do neurology consult.  Continue current medications and supportive care.     Cainen Burnham D. [name], MD     TDF/MEDQ  D:  03/10/2012  T:  03/10/2012  Job:  [id number]

## 2012-03-10 NOTE — H&P (Signed)
[name], [name]              ACCOUNT NO.:  [id number]  MEDICAL RECORD NO.:  [id number]  LOCATION:  A327                          FACILITY:  [facility]  PHYSICIAN:  Carley Strickling G. Renard Matter, MD   DATE OF BIRTH:  07/09/1946  DATE OF ADMISSION:  03/09/2012 DATE OF DISCHARGE:  LH                             HISTORY & PHYSICAL   HISTORY OF PRESENT ILLNESS:  66 year old, male patient, came to the emergency room because of weakness of his left arm and leg.  He apparently had been seen earlier in the emergency department, and then released but returned, had difficulty walking and studies done through the emergency department.  CT of the head without contrast showed no acute intracranial pathology, but scattered lacunar infarcts of the right basilar ganglia.  A chest x-ray showed mild cardiomegaly, no acute cardiac or pulmonary process seen.  The emergency room doctor felt that since his stroke like symptoms that he should be admitted for further evaluation of possible MRI, carotid Doppler ultrasound and 2D echo.  The patient has some prior difficulty with C-spine as well.  SOCIAL HISTORY:  The patient does not smoke or drink alcohol.  PRIOR MEDICAL HISTORY:  The patient has prior history of hypertension and diabetes mellitus, previous surgical procedures.  The patient had a cervical fusion and foot surgery and previous back surgery.  REVIEW OF SYSTEMS:  HEENT:  Negative.  CARDIOPULMONARY:  No cough, chest pain, or shortness of breath.  GI:  No nausea, vomiting, abdominal pain, or diarrhea.  GU:  No dysuria or hematuria.  ALLERGIES:  CODEINE.  HOME MEDICATION:  Albuterol sulfate 2 puffs every 6 hours as needed. Aspirin 81 mg daily.  Benzonatate 100 mg tablet 2-3 times a day, cinnamon caps 500 mg 2 capsules twice a day.  Diazepam 5 mg at bedtime, gabapentin 600 mg t.i.d., glimepiride 4 mg daily, glucosamine 2 tabs daily, ibuprofen 400 mg twice a day, __________ 5 mg daily, losartan potassium 1  daily, omega-3 complex 1 capsule twice daily.  PHYSICAL EXAMINATION:  GENERAL/VITAL SIGNS:  Revealed alert male with blood pressure 172/85, respirations 18, pulse 73, temp 97.6. HEENT:  Eyes, PERRLA, TMs negative.  Oropharynx benign.  NECK:  Supple. No JVD or thyroid abnormalities. HEART:  Regular rhythm.  No murmurs. LUNGS:  Clear to P and A. ABDOMEN:  No palpable organs or masses. NEUROLOGICAL:  The patient has weakness of the left upper and lower extremities.  Equal reflexes.  Cranial nerves are intact.  ASSESSMENT:  The patient is admitted for further evaluation of possible, stroke-like picture.  We will obtain MRI, carotid Doppler ultrasound, and continue further evaluation.     [name] G. Renard Matter, MD     AGM/MEDQ  D:  03/10/2012  T:  03/10/2012  Job:  [id number]

## 2012-03-11 MED ORDER — CLOPIDOGREL BISULFATE 75 MG PO TABS
75.0000 mg | ORAL_TABLET | Freq: Every day | ORAL | Status: DC
  Administered 2012-03-12 – 2012-03-14 (×3): 75 mg via ORAL
  Filled 2012-03-11 (×3): qty 1

## 2012-03-11 NOTE — Evaluation (Signed)
Physical Therapy Evaluation Patient Details Name: [name] MRN: [id number] DOB: 06-01-1946 Today's Date: 03/11/2012 Time: 4098-1191 PT Time Calculation (min): 86 min  PT Assessment / Plan / Recommendation Clinical Impression  Pt is seen for eval and the only problem noted is that of mild decrease in sensation/fine motor coordination of L hand.  He was given theraputty and other coordination ex to work on,.  He does have a significant gait ataxia which apparently has been a problem for a few years, resulting from old spinal cord compression (the cord has been decompressed surgically, but he still has sx).  He has been resistant to using a cane to help improve gait stability, so I have emphasized the importance of using one.  He has an 80% risk of fall based on the [employer].  I wouild actually prefer to see him use a walker, but ther is absolutely no way that he would use one.  II am also recommending OutpatientPT for facilitation of his balance and coordination.    PT Assessment  All further PT needs can be met in the next venue of care    Follow Up Recommendations  Outpatient PT    Barriers to Discharge        lEquipment Recommendations  None recommended by PT    Recommendations for Other Services     Frequency      Precautions / Restrictions Precautions Precautions: Fall Restrictions Weight Bearing Restrictions: No   Pertinent Vitals/Pain       Mobility  Bed Mobility Bed Mobility: Sit to Supine;Supine to Sit Supine to Sit: 7: Independent Sit to Supine: 7: Independent Transfers Transfers: Sit to Stand;Stand to Sit Sit to Stand: 6: Modified independent (Device/Increase time);With upper extremity assist;Without upper extremity assist Stand to Sit: 6: Modified independent (Device/Increase time);With upper extremity assist;Without upper extremity assist Ambulation/Gait Ambulation/Gait Assistance: 4: Min assist Ambulation Distance (Feet): 120 Feet Assistive  device: None;Straight cane Ambulation/Gait Assistance Details: cane helps to stabilize ataxia Gait Pattern: Lateral trunk lean to left;Scissoring;Ataxic Gait velocity: stability improves with increased pace General Gait Details: gait ataxia is an old problem, resulting from previous spinal cord compression...he has been resistant to using a cane for improved gait stability Stairs: No Modified Rankin (Stroke Patients Only) Pre-Morbid Rankin Score: No significant disability Modified Rankin: No significant disability    Exercises     PT Diagnosis: Abnormality of gait  PT Problem List: Decreased mobility;Decreased balance PT Treatment Interventions:     PT Goals    Visit Information  Last PT Received On: 03/11/12    Subjective Data  Subjective: I usually walk "goofy" Patient Stated Goal: wants to walk with better stability   Prior Functioning  Home Living Lives With: Spouse Available Help at Discharge: Family Type of Home: House Home Access: Level entry Home Layout: Multi-level;Bed/bath upstairs Alternate Level Stairs-Number of Steps: 7 steps Alternate Level Stairs-Rails: None Bathroom Toilet: Standard Home Adaptive Equipment: Straight cane Additional Comments: holds onto the wall when going up/down steps Prior Function Level of Independence: Independent Able to Take Stairs?: Yes Driving: Yes Vocation: Full time employment Comments: asst. Production designer, theatre/television/film at [employer] Communication: HOH Dominant Hand: Left    Cognition  Overall Cognitive Status: Appears within functional limits for tasks assessed/performed Arousal/Alertness: Awake/alert Orientation Level: Appears intact for tasks assessed Behavior During Session: [facility] for tasks performed    Extremity/Trunk Assessment Right Upper Extremity Assessment RUE ROM/Strength/Tone: Within functional levels RUE Sensation: WFL - Light Touch;WFL - Proprioception RUE Coordination: WFL -  gross/fine motor Left Upper  Extremity Assessment LUE ROM/Strength/Tone: Within functional levels LUE Sensation: Deficits LUE Sensation Deficits: decreased sensation in hand LUE Coordination: Deficits LUE Coordination Deficits: decreased fine motor skills Right Lower Extremity Assessment RLE ROM/Strength/Tone: Within functional levels RLE Sensation: Deficits RLE Sensation Deficits: decreased sensation to light touch in foot RLE Coordination: WFL - gross motor Left Lower Extremity Assessment LLE ROM/Strength/Tone: Within functional levels LLE Sensation: WFL - Light Touch;WFL - Proprioception LLE Coordination: WFL - gross/fine motor Trunk Assessment Trunk Assessment: Normal   Balance Balance Balance Assessed: Yes Standardized Balance Assessment Standardized Balance Assessment: Berg Balance Test Berg Balance Test Sit to Stand: Able to stand without using hands and stabilize independently Standing Unsupported: Able to stand safely 2 minutes Sitting with Back Unsupported but Feet Supported on Floor or Stool: Able to sit safely and securely 2 minutes Stand to Sit: Sits safely with minimal use of hands Transfers: Able to transfer safely, minor use of hands Standing Unsupported with Eyes Closed: Able to stand 10 seconds safely Standing Ubsupported with Feet Together: Able to place feet together independently and stand 1 minute safely From Standing, Reach Forward with Outstretched Arm: Can reach confidently >25 cm (10") From Standing Position, Pick up Object from Floor: Able to pick up shoe, needs supervision From Standing Position, Turn to Look Behind Over each Shoulder: Looks behind from both sides and weight shifts well Turn 360 Degrees: Able to turn 360 degrees safely in 4 seconds or less Standing Unsupported, Alternately Place Feet on Step/Stool: Able to complete >2 steps/needs minimal assist Standing Unsupported, One Foot in Front: Needs help to step but can hold 15 seconds Standing on One Leg: Able to lift leg  independently and hold equal to or more than 3 seconds Total Score: 47   End of Session PT - End of Session Equipment Utilized During Treatment: Gait belt Activity Tolerance: Patient tolerated treatment well Patient left: in bed;with call bell/phone within reach;with bed alarm set;with family/visitor present Nurse Communication: Mobility status   Konrad Penta 03/11/2012, 11:00 AM

## 2012-03-11 NOTE — Consult Note (Signed)
[name], [name]              ACCOUNT NO.:  [id number]  MEDICAL RECORD NO.:  [id number]  LOCATION:  A327                          FACILITY:  [facility]  PHYSICIAN:  Latreshia Beauchaine A. Gerilyn Pilgrim, M.D. DATE OF BIRTH:  11-29-1945  DATE OF CONSULTATION:  03/10/2012 DATE OF DISCHARGE:                                CONSULTATION   The patient is a 66 year old left-handed white male who developed the acute onset of weakness of the left upper and lower extremities.  This was associated with some difficulty ambulating.  The patient had initial CT scan, which showed lacunar infarcts.  The patient did have MRI which shows acute infarct right hemisphere confirming the patient's findings. The patient reports some mild improvement in symptoms.  PHYSICAL EXAMINATION:  GENERAL:  Shows an obese pleasant man, in no acute distress.  He is sleeping, but easily arousable. HEENT:  Evaluation shows large stocky neck, large tongue. ABDOMEN:  Obese, but soft. EXTREMITIES:  No significant edema. NEUROLOGIC:  Mentation:  When awakened, he is lucid, coherent.  Speech is normal.  Language and cognition are also intact.  Cranial nerve evaluation shows the pupils are equal, round, reactive to light. Extraocular movements are full.  Facial muscle strength is symmetric. Tongue is midline.  Uvula midline.  Shoulder shrug is normal.  Motor examination shows a clear left-sided weakness graded at 4/5.  He does have a pronator drift in the left upper extremity.  Right side shows normal tone, bulk, and strength.  Coordination shows no dysmetria. Reflexes are diminished, but preserved.  LABORATORY DATA:  Carotid Doppler shows a left carotid stenosis that is high grade, 70-99%.  The velocity is approximately 256.  IMPRESSION:  Acute infarct right hemisphere, presented with left-sided weakness.  The patient reported he had been on aspirin previous to the event and he was compliant with this for the last several years.   This "represents" a potential aspirin failure.  Risk factors, diabetes, hypertension, and obesity.  RECOMMENDATION:  I think we will add Plavix to the aspirin and do the dual antiplatelet agents for the next 2-3 months.  Afterwards, we can switched to Plavix.  Continue with other risk factor modification.  I suggested lipid profile and I would recommend placing the patient on statin medication for additional risk factor reductions.  Thanks for this consultation.     Kaiyah Eber A. Gerilyn Pilgrim, M.D.     KAD/MEDQ  D:  03/11/2012  T:  03/11/2012  Job:  [id number]

## 2012-03-11 NOTE — Consult Note (Signed)
NAMEHILLEL, [name]              ACCOUNT NO.:  [id number]  MEDICAL RECORD NO.:  [id number]  LOCATION:  A327                          FACILITY:  [facility]  PHYSICIAN:  Matty Vanroekel A. Gerilyn Pilgrim, M.D. DATE OF BIRTH:  [birth year]  DATE OF CONSULTATION: DATE OF DISCHARGE:                                CONSULTATION   ADDENDUM:  In regards to the significant stenosis on the left, I would recommend that the patient be referred to a vascular surgeon once he is gone through the acute stroke phase.  This could be done in the outpatient setting in next few weeks.     Kallan Bischoff A. Gerilyn Pilgrim, M.D.     KAD/MEDQ  D:  03/11/2012  T:  03/11/2012  Job:  [id number]

## 2012-03-11 NOTE — Progress Notes (Signed)
[name], [name]              ACCOUNT NO.:  [id number]  MEDICAL RECORD NO.:  [id number]  LOCATION:  A327                          FACILITY:  [facility]  PHYSICIAN:  Aboubacar Matsuo D. [name], MD   DATE OF BIRTH:  12-Jun-1946  DATE OF PROCEDURE:  03/11/2012 DATE OF DISCHARGE:                                PROGRESS NOTE   SUBJECTIVE:  The patient feels slightly better.  He was trying to ambulate with physical therapy.  No headache or chest pain.  OBJECTIVE:  GENERAL:  The patient is alert, awake, and sick looking. VITAL SIGNS:  Blood pressure 128/75, pulse 66, respiratory rate 18, temperature 97 degrees Fahrenheit. CHEST:  Decreased air entry, few rhonchi. CARDIOVASCULAR SYSTEM:  First and second heart sounds heard.  No murmur. No gallop. ABDOMEN:  Soft and lax.  Bowel sound is positive.  No mass or organomegaly. EXTREMITIES:  No leg edema.  MRI of the brain showed scattered small area of acute infarct in the deep white matter on the right side.  His carotid Doppler also showed less than 50% stenosis on the right side and 70-90% stenosis on the left side.  ASSESSMENT: 1. Acute cerebrovascular accident. 2. Carotid artery disease. 3. History of hypertension.  PLAN:  Continue the patient on physical therapy, occupational therapy. We will continue on antihypertensives.  We will continue aspirin.  We will follow Neurology recommendation.     Duchess Armendarez D. [name], MD     TDF/MEDQ  D:  03/11/2012  T:  03/11/2012  Job:  [id number]

## 2012-03-12 LAB — LIPID PANEL
Cholesterol: 174 mg/dL (ref 0–200)
HDL: 34 mg/dL — ABNORMAL LOW (ref >39)
LDL Cholesterol: 107 mg/dL — ABNORMAL HIGH (ref 0–99)
Total CHOL/HDL Ratio: 5.1 RATIO
Triglycerides: 167 mg/dL — ABNORMAL HIGH (ref <150)
VLDL: 33 mg/dL (ref 0–40)

## 2012-03-12 NOTE — Progress Notes (Signed)
[name], [name]              ACCOUNT NO.:  [id number]  MEDICAL RECORD NO.:  [id number]  LOCATION:  A327                          FACILITY:  [facility]  PHYSICIAN:  Safwan Tomei D. [name], MD   DATE OF BIRTH:  1946-01-31  DATE OF PROCEDURE:  03/12/2012 DATE OF DISCHARGE:                                PROGRESS NOTE   SUBJECTIVE:  The patient feels better.  He is still having problem with his left side.  He is trying to work with physical therapy.  OBJECTIVE:  GENERAL:  The patient is alert, awake and resting.  VITAL SIGNS:  Blood pressure 103/64, pulse 65, respiratory rate 18, temperature 97 degrees Fahrenheit.  CHEST:  Clear lung fields.  Good air entry.  CARDIOVASCULAR SYSTEM:  First and second heart sounds heard.  No murmur. No gallop.  ABDOMEN:  Soft and lax.  Bowel sound is positive.  No mass or organomegaly.  EXTREMITIES:  No leg edema.  ASSESSMENT: 1. Acute cerebrovascular accident with left-sided hemiplegia.  1. Carotid artery disease.  1. History of hypertension.  PLAN:  We will continue the patient on aspirin and Plavix as recommended by Neurology.  Continue physical therapy and occupational therapy.  We will do lipid panel.  We will continue supportive care.     [name] D. [name], MD     TDF/MEDQ  D:  03/12/2012  T:  03/12/2012  Job:  [id number]

## 2012-03-13 DIAGNOSIS — I517 Cardiomegaly: Secondary | ICD-10-CM

## 2012-03-13 LAB — GLUCOSE, CAPILLARY
Glucose-Capillary: 187 mg/dL — ABNORMAL HIGH (ref 70–99)
Glucose-Capillary: 175 mg/dL — ABNORMAL HIGH (ref 70–99)

## 2012-03-13 MED ORDER — ATORVASTATIN CALCIUM 10 MG PO TABS
10.0000 mg | ORAL_TABLET | Freq: Every day | ORAL | Status: DC
  Administered 2012-03-13: 10 mg via ORAL
  Filled 2012-03-13: qty 1

## 2012-03-13 NOTE — Progress Notes (Signed)
Subjective: Interval History:    Objective: Vital signs in last 24 hours: Temp:  [97.7 F (36.5 C)-97.8 F (36.6 C)] 97.7 F (36.5 C) (06/24 0441) Pulse Rate:  [66-68] 66  (06/24 0441) Resp:  [18-20] 20  (06/24 0441) BP: (124-160)/(71-85) 151/77 mmHg (06/24 0441) SpO2:  [94 %-96 %] 96 % (06/24 0441)  Intake/Output from previous day: 06/23 0701 - 06/24 0700 In: 1320 [P.O.:1320] Out: 1300 [Urine:1300] Intake/Output this shift:   Nutritional status: Cardiac    Lab Results: No results found for this basename: WBC:2,HGB:2,HCT:2,PLT:2,NA:2,K:2,CL:2,CO2:2,GLUCOSE:2,BUN:2,CREATININE:2,CALCIUM:2,LABA1C in the last 72 hours Lipid Panel  Basename 03/12/12 0812  CHOL 174  TRIG 167*  HDL 34*  CHOLHDL 5.1  VLDL 33  LDLCALC 454*    Studies/Results: No results found.  Medications:   Assessment/Plan: See dict   LOS: 4 days   Glendy Barsanti

## 2012-03-13 NOTE — Clinical Social Work Psychosocial (Signed)
Clinical Social Work Department BRIEF PSYCHOSOCIAL ASSESSMENT 03/13/2012  Patient:  [name], [name]     Account Number:  [id number]     Admit date:  03/09/2012  Clinical Social Worker:  Nancie Neas  Date/Time:  03/13/2012 10:30 AM  Referred by:  Physician  Date Referred:  03/13/2012 Referred for  SNF Placement   Other Referral:   Interview type:  Patient Other interview type:   and wife    PSYCHOSOCIAL DATA Living Status:  WIFE Admitted from facility:   Level of care:   Primary support name:  Pat Primary support relationship to patient:   Degree of support available:   very supportive but limited due to health concerns    CURRENT CONCERNS Current Concerns  Post-Acute Placement   Other Concerns:    SOCIAL WORK ASSESSMENT / PLAN CSW met with pt and pt's wife at bedside following MD referral for SNF placement.  Pt lives with wife who is supportive.  She stays at home and is able to assist pt with care but feels she is somewhat limited due to her own health issues.  Pt works part time at [employer]. Per PT, he has gait ataxia at baseline and recommendation is for outpatient PT at d/c.  CSW discussed d/c plan and pt plans to return home.  They are aware that pt does not qualify for SNF at this time.  Pt's wife is somewhat concerned about taking him home as they have a tri-level house but she states they have done it before.  CSW discussed possibility of using [facility] to assist pt.  They would prefer home health as opposed to outpatient therapy in order to limit pt's need to leave the home.  CM notified.   Assessment/plan status:  Referral to [facility] Other assessment/ plan:   Information/referral to community resources:   CM for home health/equipment needs  Hydrographic surveyor    PATIENT'S/FAMILY'S RESPONSE TO PLAN OF CARE: Pt and pt's wife plan for pt to return home when medically stable.  CSW discussed with CM need for possible OT  consult.  CSW will sign off unless further needs arise prior to d/c.

## 2012-03-13 NOTE — Progress Notes (Signed)
NAMEJENNA, ARDOIN              ACCOUNT NO.:  [id number]  MEDICAL RECORD NO.:  [id number]  LOCATION:  A327                          FACILITY:  [facility]  PHYSICIAN:  Danilyn Cocke A. Gerilyn Pilgrim, M.D. DATE OF BIRTH:  08/24/1946  DATE OF PROCEDURE: DATE OF DISCHARGE:                                PROGRESS NOTE   SUBJECTIVE:  The patient reports that he is feeling weak specifically in the left side.  He is lucid and coherent.  Speech is normal.  Language and cognition are intact.  Facial muscle strength is symmetric.  He has full extraocular movements.  Left lower extremity actually has improved since my initial evaluation is 5/5 both proximally and distally.  Left upper extremity is little worse is 5/4- both proximally and distally. Right side shows normal tone, bulk, and strength.  ASSESSMENT AND PLAN: 1. Acute right-sided hemispheric infarct.  The left leg has actually     improved since been in normal strength but the left upper extremity     is a little worse.  I will continue with the current care with     aspirin, Plavix combination.  Echo was supposed to be ordered but     unsure if it was done.  We will reorder this.  1. Asymptomatic left high-grade carotid stenosis.  Again, this can be     managed in outpatient setting with referral to vascular surgeon     once the patient is over the acute stroke phase in next couple of     months or so.     Ellicia Alix A. Gerilyn Pilgrim, M.D.     KAD/MEDQ  D:  03/13/2012  T:  03/13/2012  Job:  [id number]

## 2012-03-13 NOTE — Progress Notes (Signed)
[name], SHEAHAN              ACCOUNT NO.:  [id number]  MEDICAL RECORD NO.:  [id number]  LOCATION:  A327                          FACILITY:  [facility]  PHYSICIAN:  Teaghan Melrose D. [name], MD   DATE OF BIRTH:  1946-04-13  DATE OF PROCEDURE:  03/13/2012 DATE OF DISCHARGE:                                PROGRESS NOTE   SUBJECTIVE:  The patient feels better.  He has no new complaints.  He has left-sided weakness.  He is trying to work with physical therapy. However, he is not seen stable.  OBJECTIVE:  GENERAL:  The patient is alert, awake, and resting.  VITAL SIGNS:  Blood pressure 124/73, pulse 66, respiratory rate 18, temperature 97.7 degrees Fahrenheit.  CHEST:  Clear lung field.  Good air entry.  CARDIOVASCULAR SYSTEM:  First and second heart sounds heard.  No murmur. No gallop.  ABDOMEN:  Soft and lax.  Bowel sound is positive.  No mass or organomegaly.  EXTREMITIES:  Left-sided hemiplegia.  ASSESSMENT: 1. Acute CVA.  1. Carotid artery disease.  1. Hypertension.  PLAN:  We will continue the patient on physical therapy and occupational therapy.  I will continue aspirin and Plavix.  I have discussed his condition with his wife and I have also go talk to her about possible placement for rehab.  However, they have not yet decided.  They will talk to Dr. Juanetta Gosling tomorrow and social worker.     [name] D. [name], MD    TDF/MEDQ  D:  03/13/2012  T:  03/13/2012  Job:  [id number]

## 2012-03-13 NOTE — Progress Notes (Signed)
*  PRELIMINARY RESULTS* Echocardiogram 2D Echocardiogram has been performed.  Caswell Corwin 03/13/2012, 2:06 PM

## 2012-03-14 DIAGNOSIS — E119 Type 2 diabetes mellitus without complications: Secondary | ICD-10-CM | POA: Diagnosis present

## 2012-03-14 DIAGNOSIS — E1159 Type 2 diabetes mellitus with other circulatory complications: Secondary | ICD-10-CM | POA: Diagnosis present

## 2012-03-14 DIAGNOSIS — G629 Polyneuropathy, unspecified: Secondary | ICD-10-CM | POA: Diagnosis present

## 2012-03-14 DIAGNOSIS — I429 Cardiomyopathy, unspecified: Secondary | ICD-10-CM | POA: Diagnosis present

## 2012-03-14 DIAGNOSIS — Z8673 Personal history of transient ischemic attack (TIA), and cerebral infarction without residual deficits: Secondary | ICD-10-CM | POA: Diagnosis present

## 2012-03-14 DIAGNOSIS — I10 Essential (primary) hypertension: Secondary | ICD-10-CM | POA: Diagnosis present

## 2012-03-14 HISTORY — DX: Cardiomyopathy, unspecified: I42.9

## 2012-03-14 HISTORY — DX: Personal history of transient ischemic attack (TIA), and cerebral infarction without residual deficits: Z86.73

## 2012-03-14 LAB — GLUCOSE, CAPILLARY: Glucose-Capillary: 149 mg/dL — ABNORMAL HIGH (ref 70–99)

## 2012-03-14 MED ORDER — INSULIN ASPART 100 UNIT/ML ~~LOC~~ SOLN: 0.0000 [IU] | Freq: Three times a day (TID) | SUBCUTANEOUS | Status: DC

## 2012-03-14 MED ORDER — PANTOPRAZOLE SODIUM 40 MG PO TBEC: 40.0000 mg | DELAYED_RELEASE_TABLET | Freq: Two times a day (BID) | ORAL | Status: DC

## 2012-03-14 MED ORDER — ATORVASTATIN CALCIUM 10 MG PO TABS: 10.0000 mg | ORAL_TABLET | Freq: Every day | ORAL | Status: DC

## 2012-03-14 MED ORDER — CLOPIDOGREL BISULFATE 75 MG PO TABS: 75.0000 mg | ORAL_TABLET | Freq: Every day | ORAL | Status: AC

## 2012-03-14 MED ORDER — INSULIN ASPART 100 UNIT/ML ~~LOC~~ SOLN: 0.0000 [IU] | Freq: Every day | SUBCUTANEOUS | Status: DC

## 2012-03-14 NOTE — Discharge Instructions (Signed)
Cardiac Diet This diet can help prevent heart disease and stroke. Many factors influence your heart health, including eating and exercise habits. Coronary risk rises a lot with abnormal blood fat (lipid) levels. Cardiac meal planning includes limiting unhealthy fats, increasing healthy fats, and making other small dietary changes. General guidelines are as follows:  Adjust calorie intake to reach and maintain desirable body weight.   Limit total fat intake to less than 30% of total calories. Saturated fat should be less than 7% of calories.   Saturated fats are found in animal products and in some vegetable products. Saturated vegetable fats are found in coconut oil, cocoa butter, palm oil, and palm kernel oil. Read labels carefully to avoid these products as much as possible. Use butter in moderation. Choose tub margarines and oils that have 2 grams of fat or less. Good cooking oils are canola and olive oils.   Practice low-fat cooking techniques. Do not fry food. Instead, broil, bake, boil, steam, grill, roast on a rack, stir-fry, or microwave it. Other fat reducing suggestions include:   Remove the skin from poultry.   Remove all visible fat from meats.   Skim the fat off stews, soups, and gravies before serving them.   Steam vegetables in water or broth instead of sauting them in fat.   Avoid foods with trans fat (or hydrogenated oils), such as commercially fried foods and commercially baked goods. Commercial shortening and deep-frying fats will contain trans fat.   Increase intake of fruits, vegetables, whole grains, and legumes to replace foods high in fat.   Increase consumption of nuts, legumes, and seeds to at least 4 servings weekly. One serving of a legume equals  cup, and 1 serving of nuts or seeds equals  cup.   Choose whole grains more often. Have 3 servings per day (a serving is 1 ounce [oz]).   Have at least 4 cups of fruit and vegetable a day.   Increase your intake  of soluble fiber to 10 to 25 grams per day. Soluble fiber binds cholesterol to be removed from the blood. Foods high in soluble fiber are dried beans, citrus fruits, oats, apples, bananas, broccoli, Brussels sprouts, and eggplant.   Try to include foods fortified with plant sterols or stanols, such as yogurt, breads, juices, or margarines. Choose several fortified foods to achieve a daily intake of 2 to 3 grams of plant sterols or stanols.   Foods with omega-3 fats can help reduce your risk of heart disease. Aim to have a 3.5 oz portion of fatty fish twice per week, such as salmon, mackerel, albacore tuna, sardines, lake trout, or herring. If you wish to take a fish oil supplement, choose one that contains 1 gram of both DHA and EPA.   Limit processed meats to 2 servings (3 oz portion) weekly.   Limit the sodium in your diet to 1500 milligrams (mg) per day. If you have high blood pressure, talk to a registered dietitian about a DASH (Dietary Approaches to Stop Hypertension) eating plan.   Limit beverages with added sugar, such as soda, to no more than 36 ounces per week.  CHOOSING FOODS Starches  Allowed: Breads: All kinds (wheat, rye, raisin, white, oatmeal, [place], [place], and English muffin bread). Low-fat rolls: English muffins, frankfurter and hamburger buns, bagels, pita bread, tortillas (not fried). Pancakes, waffles, biscuits, and muffins made with recommended oil.   Avoid: Products made with saturated or trans fats, oils, or whole milk products. Butter rolls, cheese breads,  croissants. Commercial doughnuts, muffins, sweet rolls, biscuits, waffles, pancakes, store-bought mixes.  Crackers  Allowed: Low-fat crackers and snacks: Animal, graham, rye, saltine (with recommended oil, no lard), oyster, and matzo crackers. Bread sticks, melba toast, rusks, flatbread, pretzels, and light popcorn.   Avoid: High-fat crackers: cheese crackers, butter crackers, and those made with coconut, palm oil,  or trans fat (hydrogenated oils). Buttered popcorn.  Cereals  Allowed: Hot or cold whole-grain cereals.   Avoid: Cereals containing coconut, hydrogenated vegetable fat, or animal fat.  Potatoes / Pasta / Rice  Allowed: All kinds of potatoes, rice, and pasta (such as macaroni, spaghetti, and noodles).   Avoid: Pasta or rice prepared with cream sauce or high-fat cheese. Chow mein noodles, [place] fries.  Vegetables  Allowed: All vegetables and vegetable juices.   Avoid: Fried vegetables. Vegetables in cream, butter, or high-fat cheese sauces. Limit coconut. Fruit in cream or custard.  Meat and Meat Substitutes  Allowed: Limit your intake of meat, seafood, and poultry to no more than 6 oz (cooked weight) per day. All lean, well-trimmed beef, veal, pork, and lamb. All chicken and [place] without skin. All fish and shellfish. Wild game: wild duck, rabbit, pheasant, and venison. Meatless dishes: recipes with dried beans, peas, lentils, and tofu (soybean curd). Seeds and nuts: all seeds and most nuts.   Avoid: Prime grade and other heavily marbled and fatty meats, such as short ribs, spare ribs, rib eye roast or steak, frankfurters, sausage, bacon, and high-fat luncheon meats, mutton. Caviar. Commercially fried fish. Domestic duck, goose, venison sausage. Organ meats: liver, gizzard, heart, chitterlings, brains, kidney, sweetbreads.  Dairy  Allowed: Egg whites or low-cholesterol egg substitutes may be used as desired. Low-fat cheeses: nonfat or low-fat cottage cheese (1% or 2% fat), cheeses made with part skim milk, such as mozzarella, farmers, string, or ricotta. (Cheeses should be labeled no more than 2 to 6 grams fat per oz.)   Avoid: Whole milk cheeses, including colby, cheddar, mu[place], Monterey Jack, [place], [place], [place], American, Swiss, and blue. Creamed cottage cheese, cream cheese.  Milk  Allowed: Skim (or 1%) milk: liquid, powdered, or evaporated. Buttermilk made with low-fat milk.  Drinks made with skim or low-fat milk or cocoa. Chocolate milk or cocoa made with skim or low-fat (1%) milk. Nonfat or low-fat yogurt.   Avoid: Whole milk and whole milk products, including buttermilk or yogurt made from whole milk, drinks made from whole milk. Condensed milk, evaporated whole milk, and 2% milk.  Soups and Combination Foods  Allowed: Low-fat low-sodium soups: broth, dehydrated soups, homemade broth, soups with the fat removed, homemade cream soups made with skim or low-fat milk. Low-fat spaghetti, lasagna, chili, and Spanish rice if low-fat ingredients and low-fat cooking techniques are used.   Avoid: Cream soups made with whole milk, cream, or high-fat cheese. All other soups.  Desserts and Sweets  Allowed: Sherbet, fruit ices, gelatins, meringues, and angel food cake. Homemade desserts with recommended fats, oils, and milk products. Jam, jelly, honey, marmalade, sugars, and syrups. Pure sugar candy, such as gum drops, hard candy, jelly beans, marshmallows, mints, and small amounts of dark chocolate.   Avoid: Commercially prepared cakes, pies, cookies, frosting, pudding, or mixes for these products. Desserts containing whole milk products, chocolate, coconut, lard, palm oil, or palm kernel oil. Ice cream or ice cream drinks. Candy that contains chocolate, coconut, butter, hydrogenated fat, or unknown ingredients. Buttered syrups.  Fats and Oils  Allowed: Vegetable oils: safflower, sunflower, corn, soybean, cottonseed, sesame, canola, olive, or  peanut. Non-hydrogenated margarines. Salad dressing or mayonnaise: homemade or commercial, made with a recommended oil. Low or nonfat salad dressing or mayonnaise.   Limit added fats and oils to 6 to 8 tsp per day (includes fats used in cooking, baking, salads, and spreads on bread). Remember to count the "hidden fats" in foods.   Avoid: Solid fats and shortenings: butter, lard, salt pork, bacon drippings. Gravy containing meat fat,  shortening, or suet. Cocoa butter, coconut. Coconut oil, palm oil, palm kernel oil, or hydrogenated oils: these ingredients are often used in bakery products, nondairy creamers, whipped toppings, candy, and commercially fried foods. Read labels carefully. Salad dressings made of unknown oils, sour cream, or cheese, such as blue cheese and Roquefort. Cream, all kinds: half-and-half, light, heavy, or whipping. Sour cream or cream cheese (even if "light" or low-fat). Nondairy cream substitutes: coffee creamers and sour cream substitutes made with palm, palm kernel, hydrogenated oils, or coconut oil.  Beverages  Allowed: Coffee (regular or decaffeinated), tea. diet carbonated beverages, mineral water. Alcohol: Check with your caregiver. Moderation is recommended.   Avoid: Whole milk, regular sodas, and juice drinks with added sugar.  Condiments  Allowed: All seasonings and condiments. Cocoa powder. "Cream" sauces made with recommended ingredients.   Avoid: Carob powder made with hydrogenated fats.  SAMPLE MENU Breakfast   cup orange juice    cup oatmeal   1 slice toast   1 tsp margarine   1 cup skim milk  Lunch  [place] sandwich with 2 oz [place], 2 slices bread   Lettuce and tomato slices   Fresh fruit   Carrot sticks   Coffee or tea   Snack   Fresh fruit or low-fat crackers  Dinner  3 oz lean ground beef   1 baked potato   1 tsp margarine    cup asparagus   Lettuce salad   1 tbs non-creamy dressing    cup peach slices   1 cup skim milk  Document Released: 06/15/2008 Document Revised: 08/26/2011 Document Reviewed: 09/08/2010 [facility] Patient Information 2012 [place], [place].Congenital Myasthenia Congenital myasthenia is a group of disorders that affect the transmission of signals from the nerves to the muscles. These disorders are present at birth (congenital). CAUSES  There are 2 different kinds of causes:  Genetic. The genetic type involves a defect in 1  of the more than 30 molecules associated with neuromuscular transmission. Depending on which molecules are involved, this form may or may not be treatable.   Autoimmune. There are several different autoimmune forms. These forms may either be temporary or permanent.  SYMPTOMS  Symptoms are usually noticed in early childhood and include:  Drooping eyelids.   Facial weakness.   Difficulty chewing.   Limb weakness.  At the time of birth, an infant may have severe generalized weakness. The weakness may be so severe that the child cannot breathe. Parents of children with congenital myasthenia frequently show no symptoms of the disorder. DIAGNOSIS  Diagnosis usually involves the following initial tests:  Tests to study the electrical activity of muscles while they are working and while they are at rest (electromyography, EMG).   Nerve conduction tests to study how well and how fast nerves work.  If the initial tests show that there may be a problem, further blood work will be done to determine if the patient has a genetic form or an autoimmune form. TREATMENT   Cholinesterase inhibitor medicines may improve the symptoms of congenital myasthenia by affecting nerves and muscles. Other  medicines may improve symptoms by altering the immune system.   Some forms of the autoimmune type may benefit from the removal of the thymus gland which produces the antibodies that are causing the weakness.  PROGNOSIS  Patients with the genetic form have the symptoms for life. Prognosis for the autoimmune forms are generally much better; many children have no symptoms as they get older. Document Released: 08/27/2002 Document Revised: 08/26/2011 Document Reviewed: 08/30/2008 [facility] Patient Information 2012 [place], [place].

## 2012-03-14 NOTE — Progress Notes (Signed)
Inpatient Diabetes Program Recommendations  AACE/ADA: New Consensus Statement on Inpatient Glycemic Control  Target Ranges:  Prepandial:   less than 140 mg/dL      Peak postprandial:   less than 180 mg/dL (1-2 hours)      Critically ill patients:  140 - 180 mg/dL  Pager:  [phone] Hours:  8 am-10pm   Reason for Visit: Patient on oral agents  Inpatient Diabetes Program Recommendations Correction (SSI): Please order CBGs while on oral agents  Alfredia Client PhD, RN Diabetes Coordinator  Office:  [phone] Team Pager:  [phone]

## 2012-03-14 NOTE — Progress Notes (Signed)
Discharge instructions reviewed with patient and his wife, both voiced understanding. Patient given discharge instructions and [facility] pharmacy was called and verified prescription are there and ready for pick up. Patient in stable condition and transported out by EMS.

## 2012-03-14 NOTE — Progress Notes (Signed)
Subjective: He says he feels better and wants to go home. He still has some "clumsiness" of his left hand. The strength in his left side has not totally returned. He has no other complaints. His blood sugars been up to some extent. I discussed his situation with his wife and she wants him to come home. She's concerned about the fact that he may need surgery for his carotid but that will need to be put off until he is over the acute phase of the stroke  Objective: Vital signs in last 24 hours: Temp:  [97.6 F (36.4 C)-98.3 F (36.8 C)] 98.3 F (36.8 C) (06/25 0554) Pulse Rate:  [70-86] 86  (06/25 0554) Resp:  [20] 20  (06/25 0554) BP: (139-153)/(81-86) 139/86 mmHg (06/25 0554) SpO2:  [90 %-95 %] 90 % (06/25 0554) Weight change:  Last BM Date: 03/11/12  Intake/Output from previous day:    PHYSICAL EXAM General appearance: alert, cooperative and moderately obese Resp: clear to auscultation bilaterally Cardio: regular rate and rhythm, S1, S2 normal, no murmur, click, rub or gallop GI: soft, non-tender; bowel sounds normal; no masses,  no organomegaly Extremities: extremities normal, atraumatic, no cyanosis or edema  Lab Results:    Basic Metabolic Panel: No results found for this basename: NA:2,K:2,CL:2,CO2:2,GLUCOSE:2,BUN:2,CREATININE:2,CALCIUM:2,MG:2,PHOS:2 in the last 72 hours Liver Function Tests: No results found for this basename: AST:2,ALT:2,ALKPHOS:2,BILITOT:2,PROT:2,ALBUMIN:2 in the last 72 hours No results found for this basename: LIPASE:2,AMYLASE:2 in the last 72 hours No results found for this basename: AMMONIA:2 in the last 72 hours CBC: No results found for this basename: WBC:2,NEUTROABS:2,HGB:2,HCT:2,MCV:2,PLT:2 in the last 72 hours Cardiac Enzymes: No results found for this basename: CKTOTAL:3,CKMB:3,CKMBINDEX:3,TROPONINI:3 in the last 72 hours BNP: No results found for this basename: PROBNP:3 in the last 72 hours D-Dimer: No results found for this basename:  DDIMER:2 in the last 72 hours CBG:  Basename 03/14/12 0824 03/13/12 1152 03/13/12 0903  GLUCAP 149* 175* 187*   Hemoglobin A1C: No results found for this basename: HGBA1C in the last 72 hours Fasting Lipid Panel:  Basename 03/12/12 0812  CHOL 174  HDL 34*  LDLCALC 107*  TRIG 167*  CHOLHDL 5.1  LDLDIRECT --   Thyroid Function Tests: No results found for this basename: TSH,T4TOTAL,FREET4,T3FREE,THYROIDAB in the last 72 hours Anemia Panel: No results found for this basename: VITAMINB12,FOLATE,FERRITIN,TIBC,IRON,RETICCTPCT in the last 72 hours Coagulation: No results found for this basename: LABPROT:2,INR:2 in the last 72 hours Urine Drug Screen: Drugs of Abuse  No results found for this basename: labopia, cocainscrnur, labbenz, amphetmu, thcu, labbarb    Alcohol Level: No results found for this basename: ETH:2 in the last 72 hours Urinalysis: No results found for this basename: COLORURINE:2,APPERANCEUR:2,LABSPEC:2,PHURINE:2,GLUCOSEU:2,HGBUR:2,BILIRUBINUR:2,KETONESUR:2,PROTEINUR:2,UROBILINOGEN:2,NITRITE:2,LEUKOCYTESUR:2 in the last 72 hours Misc. Labs:  ABGS No results found for this basename: PHART,PCO2,PO2ART,TCO2,HCO3 in the last 72 hours CULTURES No results found for this or any previous visit (from the past 240 hour(s)). Studies/Results: No results found.  Medications:  Prior to Admission:  Prescriptions prior to admission  Medication Sig Dispense Refill  . albuterol (PROAIR HFA) 108 (90 BASE) MCG/ACT inhaler Inhale 2 puffs into the lungs every 6 (six) hours as needed.      [name] aspirin EC 81 MG tablet Take 81 mg by mouth daily.      . benzonatate (TESSALON) 100 MG capsule Take 200 mg by mouth 3 (three) times daily as needed. For cough      . Cinnamon 500 MG capsule Take 1,000 mg by mouth 2 (two) times daily. Cinnamon 1000  mg + Chromium per dose--Two capsules twice daily      . diazepam (VALIUM) 5 MG tablet Take 5 mg by mouth at bedtime.      . gabapentin  (NEURONTIN) 600 MG tablet Take 600 mg by mouth 3 (three) times daily.      [name] glimepiride (AMARYL) 4 MG tablet Take 4 mg by mouth daily before breakfast.      . Glucosamine Sulfate-MSM (MSM-GLUCOSAMINE PO) Take 1 tablet by mouth 2 (two) times daily. *Glucosamine 1500mg  + MSM 1500mg       . ibuprofen (ADVIL,MOTRIN) 200 MG tablet Take 400 mg by mouth 2 (two) times daily.      [name] linagliptin (TRADJENTA) 5 MG TABS tablet Take 5 mg by mouth daily.      [name] losartan-hydrochlorothiazide (HYZAAR) 100-12.5 MG per tablet Take 1 tablet by mouth daily.      . Magnesium Oxide (PHILLIPS) 500 MG (LAX) TABS Take 1 tablet by mouth daily.      . Omega 3-6-9 Fatty Acids (OMEGA 3-6-9 COMPLEX) CAPS Take 1 capsule by mouth 2 (two) times daily.      [name] DISCONTD: omeprazole (PRILOSEC OTC) 20 MG tablet Take 20 mg by mouth 2 (two) times daily.       Scheduled:   . aspirin EC  81 mg Oral Daily  . atorvastatin  10 mg Oral q1800  . clopidogrel  75 mg Oral Q breakfast  . diazepam  5 mg Oral QHS  . enoxaparin  40 mg Subcutaneous Q24H  . gabapentin  600 mg Oral TID  . losartan  100 mg Oral Daily   And  . hydrochlorothiazide  12.5 mg Oral Daily  . ibuprofen  400 mg Oral BID  . insulin aspart  0-15 Units Subcutaneous TID WC  . insulin aspart  0-5 Units Subcutaneous QHS  . pantoprazole  40 mg Oral BID AC  . DISCONTD: glimepiride  4 mg Oral QAC breakfast  . DISCONTD: linagliptin  5 mg Oral Daily   Continuous:  [id number], benzonatate  Assesment: He has had an acute CVA. He has carotid stenosis. He has cardiomyopathy by echocardiogram. He is diabetic. He has had previous episodes of problems with ataxic gait from back surgery. He has hypertension which is pretty well controlled Active Problems:  * No active hospital problems. *     Plan: He will be discharged home today with home health services    LOS: 5 days   Omarri Eich L 03/14/2012, 8:35 AM

## 2012-03-14 NOTE — Discharge Summary (Signed)
Physician Discharge Summary  Patient ID: [name] MRN: [id number] DOB/AGE: 68-Mar-1947 68 y.o. Primary Care Physician:Emili Mcloughlin L, MD Admit date: 03/09/2012 Discharge date: 03/14/2012    Discharge Diagnoses:   Principal Problem:  *Stroke, acute, thrombotic Active Problems:  Hypertension  Diabetes mellitus type 2 in obese  Peripheral neuropathy  Cardiomyopathy  Carotid stenosis with cerebral infarction less than 8 weeks ago   Medication List  As of 03/14/2012  8:50 AM   STOP taking these medications         omeprazole 20 MG tablet         TAKE these medications         aspirin EC 81 MG tablet   Take 81 mg by mouth daily.      atorvastatin 10 MG tablet   Commonly known as: LIPITOR   Take 1 tablet (10 mg total) by mouth daily at 6 PM.      benzonatate 100 MG capsule   Commonly known as: TESSALON   Take 200 mg by mouth 3 (three) times daily as needed. For cough      Cinnamon 500 MG capsule   Take 1,000 mg by mouth 2 (two) times daily. Cinnamon 1000 mg + Chromium per dose--Two capsules twice daily      clopidogrel 75 MG tablet   Commonly known as: PLAVIX   Take 1 tablet (75 mg total) by mouth daily with breakfast.      diazepam 5 MG tablet   Commonly known as: VALIUM   Take 5 mg by mouth at bedtime.      gabapentin 600 MG tablet   Commonly known as: NEURONTIN   Take 600 mg by mouth 3 (three) times daily.      glimepiride 4 MG tablet   Commonly known as: AMARYL   Take 4 mg by mouth daily before breakfast.      ibuprofen 200 MG tablet   Commonly known as: ADVIL,MOTRIN   Take 400 mg by mouth 2 (two) times daily.      linagliptin 5 MG Tabs tablet   Commonly known as: TRADJENTA   Take 5 mg by mouth daily.      losartan-hydrochlorothiazide 100-12.5 MG per tablet   Commonly known as: HYZAAR   Take 1 tablet by mouth daily.      MSM-GLUCOSAMINE PO   Take 1 tablet by mouth 2 (two) times daily. *Glucosamine 1500mg  + MSM 1500mg       OMEGA 3-6-9  COMPLEX Caps   Take 1 capsule by mouth 2 (two) times daily.      pantoprazole 40 MG tablet   Commonly known as: PROTONIX   Take 1 tablet (40 mg total) by mouth 2 (two) times daily before a meal.      PHILLIPS 500 MG (LAX) Tabs   Generic drug: Magnesium Oxide   Take 1 tablet by mouth daily.      PROAIR HFA 108 (90 BASE) MCG/ACT inhaler   Generic drug: albuterol   Inhale 2 puffs into the lungs every 6 (six) hours as needed.            Discharged Condition: Improved    Consults: Neurology  Significant Diagnostic Studies: Dg Chest 1 View  03/08/2012  *RADIOLOGY REPORT*  Clinical Data: Right extremity weakness and slurred speech. History of diabetes.  CHEST - 1 VIEW  Comparison: Chest radiograph performed 07/02/2011  Findings: The lungs are well-aerated and clear.  There is no evidence of focal opacification, pleural effusion or pneumothorax.  The cardiomediastinal silhouette is mildly enlarged.  No acute osseous abnormalities are seen.  IMPRESSION: Mild cardiomegaly; no acute cardiopulmonary process seen.  Original Report Authenticated By: Tonia Ghent, M.D.   Ct Head Wo Contrast  03/08/2012  *RADIOLOGY REPORT*  Clinical Data: Right-sided weakness.  CT HEAD WITHOUT CONTRAST  Technique:  Contiguous axial images were obtained from the base of the skull through the vertex without contrast.  Comparison: None.  Findings: There is no evidence of acute infarction, mass lesion, or intra- or extra-axial hemorrhage on CT.  Scattered chronic lacunar infarcts are seen within the right basal ganglia.  Mild cerebellar atrophy is noted.  The brainstem and fourth ventricle are within normal limits.  The third and lateral ventricles are unremarkable in appearance.  The cerebral hemispheres are symmetric in appearance, with normal gray- white differentiation.  No mass effect or midline shift is seen.  There is no evidence of fracture; visualized osseous structures are unremarkable in appearance.  The  orbits are within normal limits. The paranasal sinuses and mastoid air cells are well-aerated.  No significant soft tissue abnormalities are seen.  IMPRESSION:  1.  No acute intracranial pathology seen on CT. 2.  Scattered chronic lacunar infarcts within the right basal ganglia.  Original Report Authenticated By: Tonia Ghent, M.D.   Mr Brain Wo Contrast  03/10/2012  *RADIOLOGY REPORT*  Clinical Data: Left-sided weakness  MRI HEAD WITHOUT CONTRAST  Technique:  Multiplanar, multiecho pulse sequences of the brain and surrounding structures were obtained according to standard protocol without intravenous contrast.  Comparison: CT 03/08/2012  Findings: Scattered small areas of restricted diffusion in the centrum semiovale on the right compatible with acute white matter infarction.  Chronic ischemic changes are present in the white matter and basal ganglia.  Chronic ischemia in the pons.  Negative for hemorrhage.  No mass or edema is present.  Vessels at the base of the brain are patent.  Mild chronic sinusitis.  IMPRESSION: Scattered small areas of acute infarction in the deep white matter on the right.  Chronic microvascular ischemia.  Original Report Authenticated By: Camelia Phenes, M.D.   Mr Cervical Spine W Wo Contrast  02/17/2012  *RADIOLOGY REPORT*  Clinical Data: Increasingly severe gait disturbance.  Right leg weakness.  MRI CERVICAL SPINE WITHOUT AND WITH CONTRAST  Technique:  Multiplanar and multiecho pulse sequences of the cervical spine, to include the craniocervical junction and cervicothoracic junction, were obtained according to standard protocol without and with intravenous contrast.  Contrast: 20mL MULTIHANCE GADOBENATE DIMEGLUMINE 529 MG/ML IV SOLN  Comparison: Radiographs dated 05/01/2010 and MRI of the cervical spine dated 03/17/2010  Findings: Scan extends from the top of the clivus through T2-3. Old lacunar infarcts in the pons.  There is focal chronic myelomalacia of the spinal cord at  C3-4. This extends across the entire spinal cord.  The compression of the spinal cord present on the prior study has been completely relieved by the anterior cervical fusion at C3-4.  C4-5:  Chronic soft disc protrusion into the left lateral recess slightly compressing the left side of the thecal sac and the left C5 nerve root.  This is unchanged.  C5-6: Soft disc protrusion into the left lateral recess which could affect the left C6 nerve, unchanged.  C6-7:  Tiny broad-based disc bulge with no neural impingement.  T1-2: New focal disc protrusion into the right lateral recess. This could affect the right T1 nerve.  This area is incompletely evaluated.  There is multilevel facet joint arthritis,  most prominent at C2-3, C6-7 and C7-T1 on the left and at C7-T1 and T1-2 on the right.  IMPRESSION:  1.  Persistent myelomalacia of the spinal cord at C3-4 with relief of the cervical spinal stenosis caused the abnormality. 2. Chronic disc protrusions at C4-5 and C5-6 to the left. 3.  New soft disc protrusion into the right lateral recess at T1-2.  Original Report Authenticated By: Gwynn Burly, M.D.   US Carotid Duplex Bilateral  03/10/2012  *RADIOLOGY REPORT*  Clinical Data: Left-sided weakness  BILATERAL CAROTID DUPLEX ULTRASOUND  Technique: Wallace Cullens scale imaging, color Doppler and duplex ultrasound was performed of bilateral carotid and vertebral arteries in the neck.  Comparison:  None.  Criteria:  Quantification of carotid stenosis is based on velocity parameters that correlate the residual internal carotid diameter with NASCET-based stenosis levels, using the diameter of the distal internal carotid lumen as the denominator for stenosis measurement.  The following velocity measurements were obtained:                   PEAK SYSTOLIC/END DIASTOLIC RIGHT ICA:                        98cm/sec CCA:                        91cm/sec SYSTOLIC ICA/CCA RATIO:     1.08 DIASTOLIC ICA/CCA RATIO: ECA:                         93cm/sec  LEFT ICA:                        255cm/sec CCA:                        89cm/sec SYSTOLIC ICA/CCA RATIO:     2.85 DIASTOLIC ICA/CCA RATIO: ECA:                        104cm/sec  Findings:  RIGHT CAROTID ARTERY: Mild smooth plaque in the common and internal carotid arteries as well as the bulb.  Low resistance internal carotid Doppler pattern.  RIGHT VERTEBRAL ARTERY:  Antegrade.  LEFT CAROTID ARTERY: Mild smooth plaque in the common carotid and bulb.  There is irregular focal plaque in the mid internal carotid. Low resistance internal carotid Doppler pattern.  LEFT VERTEBRAL ARTERY:  Antegrade.  IMPRESSION: Less than 50% stenosis in the right internal carotid artery with mild smooth plaque.  70-90% stenosis in the left internal carotid artery based on the peak systolic velocity measurement.  Original Report Authenticated By: Donavan Burnet, M.D.    Lab Results: Basic Metabolic Panel: No results found for this basename: NA:2,K:2,CL:2,CO2:2,GLUCOSE:2,BUN:2,CREATININE:2,CALCIUM:2,MG:2,PHOS:2 in the last 72 hours Liver Function Tests: No results found for this basename: AST:2,ALT:2,ALKPHOS:2,BILITOT:2,PROT:2,ALBUMIN:2 in the last 72 hours   CBC: No results found for this basename: WBC:2,NEUTROABS:2,HGB:2,HCT:2,MCV:2,PLT:2 in the last 72 hours  No results found for this or any previous visit (from the past 240 hour(s)).   Hospital Course: He was admitted with what appeared to be a stroke. This caused left-sided weakness. He has a previous episode of ataxic gait related to previous back surgery. He was treated with antihypertensives continued on his blood sugar medications and showed some improvement. He had PT consultation and neurology consultation. Skilled care facility placement was discussed but he wants  to go home with home health services.  Discharge Exam: Blood pressure 139/86, pulse 86, temperature 98.3 F (36.8 C), temperature source Oral, resp. rate 20, height 6' (1.829 m), weight  115.667 kg (255 lb), SpO2 90.00%. He still has left hand weakness and "clumsiness". His leg strength appears to be at least 4/5 and may be normal now on the left. His speech is normal. His tongue protrudes midline.  Disposition: Home with home health services  Discharge Orders    Future Orders Please Complete By Expires   Home Health      Questions: Responses:   To provide the following care/treatments PT    RN   Face-to-face encounter      Comments:   I Jacqualin Shirkey L certify that this patient is under my care and that I, or a nurse practitioner or physician's assistant working with me, had a face-to-face encounter that meets the physician face-to-face encounter requirements with this patient on 03/14/2012.   Questions: Responses:   The encounter with the patient was in whole, or in part, for the following medical condition, which is the primary reason for home health care cva   I certify that, based on my findings, the following services are medically necessary home health services Nursing    Physical therapy   My clinical findings support the need for the above services Complex treatment plan/patient with lack knowledge disease process and treatment   Further, I certify that my clinical findings support that this patient is homebound (i.e. absences from home require considerable and taxing effort and are for medical reasons or religious services or infrequently or of short duration when for other reasons) Ambulates short distances less than 300 feet   To provide the following care/treatments PT    RN   Discharge patient           Signed: Fredirick Maudlin Pager [phone]  03/14/2012, 8:50 AM

## 2012-03-15 DIAGNOSIS — I69993 Ataxia following unspecified cerebrovascular disease: Secondary | ICD-10-CM | POA: Diagnosis not present

## 2012-03-15 DIAGNOSIS — E119 Type 2 diabetes mellitus without complications: Secondary | ICD-10-CM | POA: Diagnosis not present

## 2012-03-15 DIAGNOSIS — I69959 Hemiplegia and hemiparesis following unspecified cerebrovascular disease affecting unspecified side: Secondary | ICD-10-CM | POA: Diagnosis not present

## 2012-03-15 DIAGNOSIS — I428 Other cardiomyopathies: Secondary | ICD-10-CM | POA: Diagnosis not present

## 2012-03-15 DIAGNOSIS — I10 Essential (primary) hypertension: Secondary | ICD-10-CM | POA: Diagnosis not present

## 2012-03-15 DIAGNOSIS — IMO0001 Reserved for inherently not codable concepts without codable children: Secondary | ICD-10-CM | POA: Diagnosis not present

## 2012-03-15 DIAGNOSIS — G609 Hereditary and idiopathic neuropathy, unspecified: Secondary | ICD-10-CM | POA: Diagnosis not present

## 2012-03-17 DIAGNOSIS — I10 Essential (primary) hypertension: Secondary | ICD-10-CM | POA: Diagnosis not present

## 2012-03-17 DIAGNOSIS — I428 Other cardiomyopathies: Secondary | ICD-10-CM | POA: Diagnosis not present

## 2012-03-17 DIAGNOSIS — I69959 Hemiplegia and hemiparesis following unspecified cerebrovascular disease affecting unspecified side: Secondary | ICD-10-CM | POA: Diagnosis not present

## 2012-03-17 DIAGNOSIS — E119 Type 2 diabetes mellitus without complications: Secondary | ICD-10-CM | POA: Diagnosis not present

## 2012-03-17 DIAGNOSIS — G609 Hereditary and idiopathic neuropathy, unspecified: Secondary | ICD-10-CM | POA: Diagnosis not present

## 2012-03-17 DIAGNOSIS — I69993 Ataxia following unspecified cerebrovascular disease: Secondary | ICD-10-CM | POA: Diagnosis not present

## 2012-03-18 DIAGNOSIS — I69993 Ataxia following unspecified cerebrovascular disease: Secondary | ICD-10-CM | POA: Diagnosis not present

## 2012-03-18 DIAGNOSIS — I10 Essential (primary) hypertension: Secondary | ICD-10-CM | POA: Diagnosis not present

## 2012-03-18 DIAGNOSIS — I428 Other cardiomyopathies: Secondary | ICD-10-CM | POA: Diagnosis not present

## 2012-03-18 DIAGNOSIS — I69959 Hemiplegia and hemiparesis following unspecified cerebrovascular disease affecting unspecified side: Secondary | ICD-10-CM | POA: Diagnosis not present

## 2012-03-18 DIAGNOSIS — E119 Type 2 diabetes mellitus without complications: Secondary | ICD-10-CM | POA: Diagnosis not present

## 2012-03-18 DIAGNOSIS — G609 Hereditary and idiopathic neuropathy, unspecified: Secondary | ICD-10-CM | POA: Diagnosis not present

## 2012-03-20 DIAGNOSIS — E119 Type 2 diabetes mellitus without complications: Secondary | ICD-10-CM | POA: Diagnosis not present

## 2012-03-20 DIAGNOSIS — I69993 Ataxia following unspecified cerebrovascular disease: Secondary | ICD-10-CM | POA: Diagnosis not present

## 2012-03-20 DIAGNOSIS — I428 Other cardiomyopathies: Secondary | ICD-10-CM | POA: Diagnosis not present

## 2012-03-20 DIAGNOSIS — I10 Essential (primary) hypertension: Secondary | ICD-10-CM | POA: Diagnosis not present

## 2012-03-20 DIAGNOSIS — I69959 Hemiplegia and hemiparesis following unspecified cerebrovascular disease affecting unspecified side: Secondary | ICD-10-CM | POA: Diagnosis not present

## 2012-03-20 DIAGNOSIS — G609 Hereditary and idiopathic neuropathy, unspecified: Secondary | ICD-10-CM | POA: Diagnosis not present

## 2012-03-21 DIAGNOSIS — E119 Type 2 diabetes mellitus without complications: Secondary | ICD-10-CM | POA: Diagnosis not present

## 2012-03-21 DIAGNOSIS — I69993 Ataxia following unspecified cerebrovascular disease: Secondary | ICD-10-CM | POA: Diagnosis not present

## 2012-03-21 DIAGNOSIS — I10 Essential (primary) hypertension: Secondary | ICD-10-CM | POA: Diagnosis not present

## 2012-03-21 DIAGNOSIS — G609 Hereditary and idiopathic neuropathy, unspecified: Secondary | ICD-10-CM | POA: Diagnosis not present

## 2012-03-21 DIAGNOSIS — I428 Other cardiomyopathies: Secondary | ICD-10-CM | POA: Diagnosis not present

## 2012-03-21 DIAGNOSIS — I69959 Hemiplegia and hemiparesis following unspecified cerebrovascular disease affecting unspecified side: Secondary | ICD-10-CM | POA: Diagnosis not present

## 2012-03-23 DIAGNOSIS — I69959 Hemiplegia and hemiparesis following unspecified cerebrovascular disease affecting unspecified side: Secondary | ICD-10-CM | POA: Diagnosis not present

## 2012-03-23 DIAGNOSIS — I69993 Ataxia following unspecified cerebrovascular disease: Secondary | ICD-10-CM | POA: Diagnosis not present

## 2012-03-23 DIAGNOSIS — G609 Hereditary and idiopathic neuropathy, unspecified: Secondary | ICD-10-CM | POA: Diagnosis not present

## 2012-03-23 DIAGNOSIS — E119 Type 2 diabetes mellitus without complications: Secondary | ICD-10-CM | POA: Diagnosis not present

## 2012-03-23 DIAGNOSIS — I428 Other cardiomyopathies: Secondary | ICD-10-CM | POA: Diagnosis not present

## 2012-03-23 DIAGNOSIS — I10 Essential (primary) hypertension: Secondary | ICD-10-CM | POA: Diagnosis not present

## 2012-03-27 DIAGNOSIS — I69959 Hemiplegia and hemiparesis following unspecified cerebrovascular disease affecting unspecified side: Secondary | ICD-10-CM | POA: Diagnosis not present

## 2012-03-27 DIAGNOSIS — I10 Essential (primary) hypertension: Secondary | ICD-10-CM | POA: Diagnosis not present

## 2012-03-27 DIAGNOSIS — I69993 Ataxia following unspecified cerebrovascular disease: Secondary | ICD-10-CM | POA: Diagnosis not present

## 2012-03-27 DIAGNOSIS — E119 Type 2 diabetes mellitus without complications: Secondary | ICD-10-CM | POA: Diagnosis not present

## 2012-03-27 DIAGNOSIS — I428 Other cardiomyopathies: Secondary | ICD-10-CM | POA: Diagnosis not present

## 2012-03-27 DIAGNOSIS — G609 Hereditary and idiopathic neuropathy, unspecified: Secondary | ICD-10-CM | POA: Diagnosis not present

## 2012-03-28 DIAGNOSIS — I69993 Ataxia following unspecified cerebrovascular disease: Secondary | ICD-10-CM | POA: Diagnosis not present

## 2012-03-28 DIAGNOSIS — I10 Essential (primary) hypertension: Secondary | ICD-10-CM | POA: Diagnosis not present

## 2012-03-28 DIAGNOSIS — E119 Type 2 diabetes mellitus without complications: Secondary | ICD-10-CM | POA: Diagnosis not present

## 2012-03-28 DIAGNOSIS — I69959 Hemiplegia and hemiparesis following unspecified cerebrovascular disease affecting unspecified side: Secondary | ICD-10-CM | POA: Diagnosis not present

## 2012-03-28 DIAGNOSIS — I428 Other cardiomyopathies: Secondary | ICD-10-CM | POA: Diagnosis not present

## 2012-03-28 DIAGNOSIS — G609 Hereditary and idiopathic neuropathy, unspecified: Secondary | ICD-10-CM | POA: Diagnosis not present

## 2012-03-29 DIAGNOSIS — I428 Other cardiomyopathies: Secondary | ICD-10-CM | POA: Diagnosis not present

## 2012-03-29 DIAGNOSIS — IMO0001 Reserved for inherently not codable concepts without codable children: Secondary | ICD-10-CM | POA: Diagnosis not present

## 2012-03-29 DIAGNOSIS — E119 Type 2 diabetes mellitus without complications: Secondary | ICD-10-CM | POA: Diagnosis not present

## 2012-03-29 DIAGNOSIS — I69993 Ataxia following unspecified cerebrovascular disease: Secondary | ICD-10-CM | POA: Diagnosis not present

## 2012-03-29 DIAGNOSIS — I69959 Hemiplegia and hemiparesis following unspecified cerebrovascular disease affecting unspecified side: Secondary | ICD-10-CM | POA: Diagnosis not present

## 2012-03-29 DIAGNOSIS — I10 Essential (primary) hypertension: Secondary | ICD-10-CM | POA: Diagnosis not present

## 2012-03-29 DIAGNOSIS — G609 Hereditary and idiopathic neuropathy, unspecified: Secondary | ICD-10-CM | POA: Diagnosis not present

## 2012-03-30 ENCOUNTER — Other Ambulatory Visit: Payer: Self-pay

## 2012-03-30 DIAGNOSIS — E119 Type 2 diabetes mellitus without complications: Secondary | ICD-10-CM | POA: Diagnosis not present

## 2012-03-30 DIAGNOSIS — I10 Essential (primary) hypertension: Secondary | ICD-10-CM | POA: Diagnosis not present

## 2012-03-30 DIAGNOSIS — I6529 Occlusion and stenosis of unspecified carotid artery: Secondary | ICD-10-CM

## 2012-03-30 DIAGNOSIS — I428 Other cardiomyopathies: Secondary | ICD-10-CM | POA: Diagnosis not present

## 2012-03-30 DIAGNOSIS — I69959 Hemiplegia and hemiparesis following unspecified cerebrovascular disease affecting unspecified side: Secondary | ICD-10-CM | POA: Diagnosis not present

## 2012-03-30 DIAGNOSIS — I6350 Cerebral infarction due to unspecified occlusion or stenosis of unspecified cerebral artery: Secondary | ICD-10-CM

## 2012-03-30 DIAGNOSIS — I69993 Ataxia following unspecified cerebrovascular disease: Secondary | ICD-10-CM | POA: Diagnosis not present

## 2012-03-30 DIAGNOSIS — G609 Hereditary and idiopathic neuropathy, unspecified: Secondary | ICD-10-CM | POA: Diagnosis not present

## 2012-03-31 DIAGNOSIS — I428 Other cardiomyopathies: Secondary | ICD-10-CM | POA: Diagnosis not present

## 2012-03-31 DIAGNOSIS — I10 Essential (primary) hypertension: Secondary | ICD-10-CM | POA: Diagnosis not present

## 2012-03-31 DIAGNOSIS — G609 Hereditary and idiopathic neuropathy, unspecified: Secondary | ICD-10-CM | POA: Diagnosis not present

## 2012-03-31 DIAGNOSIS — I69993 Ataxia following unspecified cerebrovascular disease: Secondary | ICD-10-CM | POA: Diagnosis not present

## 2012-03-31 DIAGNOSIS — I69959 Hemiplegia and hemiparesis following unspecified cerebrovascular disease affecting unspecified side: Secondary | ICD-10-CM | POA: Diagnosis not present

## 2012-03-31 DIAGNOSIS — E119 Type 2 diabetes mellitus without complications: Secondary | ICD-10-CM | POA: Diagnosis not present

## 2012-04-03 DIAGNOSIS — G609 Hereditary and idiopathic neuropathy, unspecified: Secondary | ICD-10-CM | POA: Diagnosis not present

## 2012-04-03 DIAGNOSIS — I69959 Hemiplegia and hemiparesis following unspecified cerebrovascular disease affecting unspecified side: Secondary | ICD-10-CM | POA: Diagnosis not present

## 2012-04-03 DIAGNOSIS — E119 Type 2 diabetes mellitus without complications: Secondary | ICD-10-CM | POA: Diagnosis not present

## 2012-04-03 DIAGNOSIS — I69993 Ataxia following unspecified cerebrovascular disease: Secondary | ICD-10-CM | POA: Diagnosis not present

## 2012-04-03 DIAGNOSIS — I10 Essential (primary) hypertension: Secondary | ICD-10-CM | POA: Diagnosis not present

## 2012-04-03 DIAGNOSIS — I428 Other cardiomyopathies: Secondary | ICD-10-CM | POA: Diagnosis not present

## 2012-04-04 DIAGNOSIS — I69959 Hemiplegia and hemiparesis following unspecified cerebrovascular disease affecting unspecified side: Secondary | ICD-10-CM | POA: Diagnosis not present

## 2012-04-04 DIAGNOSIS — G609 Hereditary and idiopathic neuropathy, unspecified: Secondary | ICD-10-CM | POA: Diagnosis not present

## 2012-04-04 DIAGNOSIS — I69993 Ataxia following unspecified cerebrovascular disease: Secondary | ICD-10-CM | POA: Diagnosis not present

## 2012-04-04 DIAGNOSIS — E119 Type 2 diabetes mellitus without complications: Secondary | ICD-10-CM | POA: Diagnosis not present

## 2012-04-04 DIAGNOSIS — I428 Other cardiomyopathies: Secondary | ICD-10-CM | POA: Diagnosis not present

## 2012-04-04 DIAGNOSIS — I10 Essential (primary) hypertension: Secondary | ICD-10-CM | POA: Diagnosis not present

## 2012-04-05 DIAGNOSIS — I69959 Hemiplegia and hemiparesis following unspecified cerebrovascular disease affecting unspecified side: Secondary | ICD-10-CM | POA: Diagnosis not present

## 2012-04-05 DIAGNOSIS — I69993 Ataxia following unspecified cerebrovascular disease: Secondary | ICD-10-CM | POA: Diagnosis not present

## 2012-04-05 DIAGNOSIS — I428 Other cardiomyopathies: Secondary | ICD-10-CM | POA: Diagnosis not present

## 2012-04-05 DIAGNOSIS — G609 Hereditary and idiopathic neuropathy, unspecified: Secondary | ICD-10-CM | POA: Diagnosis not present

## 2012-04-05 DIAGNOSIS — I10 Essential (primary) hypertension: Secondary | ICD-10-CM | POA: Diagnosis not present

## 2012-04-05 DIAGNOSIS — E119 Type 2 diabetes mellitus without complications: Secondary | ICD-10-CM | POA: Diagnosis not present

## 2012-04-06 DIAGNOSIS — I69993 Ataxia following unspecified cerebrovascular disease: Secondary | ICD-10-CM | POA: Diagnosis not present

## 2012-04-06 DIAGNOSIS — I428 Other cardiomyopathies: Secondary | ICD-10-CM | POA: Diagnosis not present

## 2012-04-06 DIAGNOSIS — E119 Type 2 diabetes mellitus without complications: Secondary | ICD-10-CM | POA: Diagnosis not present

## 2012-04-06 DIAGNOSIS — I10 Essential (primary) hypertension: Secondary | ICD-10-CM | POA: Diagnosis not present

## 2012-04-06 DIAGNOSIS — I69959 Hemiplegia and hemiparesis following unspecified cerebrovascular disease affecting unspecified side: Secondary | ICD-10-CM | POA: Diagnosis not present

## 2012-04-06 DIAGNOSIS — G609 Hereditary and idiopathic neuropathy, unspecified: Secondary | ICD-10-CM | POA: Diagnosis not present

## 2012-04-07 DIAGNOSIS — E119 Type 2 diabetes mellitus without complications: Secondary | ICD-10-CM | POA: Diagnosis not present

## 2012-04-07 DIAGNOSIS — I10 Essential (primary) hypertension: Secondary | ICD-10-CM | POA: Diagnosis not present

## 2012-04-07 DIAGNOSIS — I428 Other cardiomyopathies: Secondary | ICD-10-CM | POA: Diagnosis not present

## 2012-04-07 DIAGNOSIS — I69959 Hemiplegia and hemiparesis following unspecified cerebrovascular disease affecting unspecified side: Secondary | ICD-10-CM | POA: Diagnosis not present

## 2012-04-07 DIAGNOSIS — I69993 Ataxia following unspecified cerebrovascular disease: Secondary | ICD-10-CM | POA: Diagnosis not present

## 2012-04-07 DIAGNOSIS — G609 Hereditary and idiopathic neuropathy, unspecified: Secondary | ICD-10-CM | POA: Diagnosis not present

## 2012-04-10 DIAGNOSIS — I428 Other cardiomyopathies: Secondary | ICD-10-CM | POA: Diagnosis not present

## 2012-04-10 DIAGNOSIS — G609 Hereditary and idiopathic neuropathy, unspecified: Secondary | ICD-10-CM | POA: Diagnosis not present

## 2012-04-10 DIAGNOSIS — I69993 Ataxia following unspecified cerebrovascular disease: Secondary | ICD-10-CM | POA: Diagnosis not present

## 2012-04-10 DIAGNOSIS — I69959 Hemiplegia and hemiparesis following unspecified cerebrovascular disease affecting unspecified side: Secondary | ICD-10-CM | POA: Diagnosis not present

## 2012-04-10 DIAGNOSIS — I10 Essential (primary) hypertension: Secondary | ICD-10-CM | POA: Diagnosis not present

## 2012-04-10 DIAGNOSIS — E119 Type 2 diabetes mellitus without complications: Secondary | ICD-10-CM | POA: Diagnosis not present

## 2012-04-11 DIAGNOSIS — I428 Other cardiomyopathies: Secondary | ICD-10-CM | POA: Diagnosis not present

## 2012-04-11 DIAGNOSIS — E119 Type 2 diabetes mellitus without complications: Secondary | ICD-10-CM | POA: Diagnosis not present

## 2012-04-11 DIAGNOSIS — I69993 Ataxia following unspecified cerebrovascular disease: Secondary | ICD-10-CM | POA: Diagnosis not present

## 2012-04-11 DIAGNOSIS — I10 Essential (primary) hypertension: Secondary | ICD-10-CM | POA: Diagnosis not present

## 2012-04-11 DIAGNOSIS — I69959 Hemiplegia and hemiparesis following unspecified cerebrovascular disease affecting unspecified side: Secondary | ICD-10-CM | POA: Diagnosis not present

## 2012-04-11 DIAGNOSIS — G609 Hereditary and idiopathic neuropathy, unspecified: Secondary | ICD-10-CM | POA: Diagnosis not present

## 2012-04-12 DIAGNOSIS — I428 Other cardiomyopathies: Secondary | ICD-10-CM | POA: Diagnosis not present

## 2012-04-12 DIAGNOSIS — I10 Essential (primary) hypertension: Secondary | ICD-10-CM | POA: Diagnosis not present

## 2012-04-12 DIAGNOSIS — I69959 Hemiplegia and hemiparesis following unspecified cerebrovascular disease affecting unspecified side: Secondary | ICD-10-CM | POA: Diagnosis not present

## 2012-04-12 DIAGNOSIS — I69993 Ataxia following unspecified cerebrovascular disease: Secondary | ICD-10-CM | POA: Diagnosis not present

## 2012-04-12 DIAGNOSIS — E119 Type 2 diabetes mellitus without complications: Secondary | ICD-10-CM | POA: Diagnosis not present

## 2012-04-12 DIAGNOSIS — G609 Hereditary and idiopathic neuropathy, unspecified: Secondary | ICD-10-CM | POA: Diagnosis not present

## 2012-04-13 DIAGNOSIS — I69959 Hemiplegia and hemiparesis following unspecified cerebrovascular disease affecting unspecified side: Secondary | ICD-10-CM | POA: Diagnosis not present

## 2012-04-13 DIAGNOSIS — I69993 Ataxia following unspecified cerebrovascular disease: Secondary | ICD-10-CM | POA: Diagnosis not present

## 2012-04-13 DIAGNOSIS — I10 Essential (primary) hypertension: Secondary | ICD-10-CM | POA: Diagnosis not present

## 2012-04-13 DIAGNOSIS — I428 Other cardiomyopathies: Secondary | ICD-10-CM | POA: Diagnosis not present

## 2012-04-13 DIAGNOSIS — G609 Hereditary and idiopathic neuropathy, unspecified: Secondary | ICD-10-CM | POA: Diagnosis not present

## 2012-04-13 DIAGNOSIS — E119 Type 2 diabetes mellitus without complications: Secondary | ICD-10-CM | POA: Diagnosis not present

## 2012-04-14 DIAGNOSIS — I10 Essential (primary) hypertension: Secondary | ICD-10-CM | POA: Diagnosis not present

## 2012-04-14 DIAGNOSIS — E119 Type 2 diabetes mellitus without complications: Secondary | ICD-10-CM | POA: Diagnosis not present

## 2012-04-14 DIAGNOSIS — I69959 Hemiplegia and hemiparesis following unspecified cerebrovascular disease affecting unspecified side: Secondary | ICD-10-CM | POA: Diagnosis not present

## 2012-04-14 DIAGNOSIS — G609 Hereditary and idiopathic neuropathy, unspecified: Secondary | ICD-10-CM | POA: Diagnosis not present

## 2012-04-14 DIAGNOSIS — I69993 Ataxia following unspecified cerebrovascular disease: Secondary | ICD-10-CM | POA: Diagnosis not present

## 2012-04-14 DIAGNOSIS — I428 Other cardiomyopathies: Secondary | ICD-10-CM | POA: Diagnosis not present

## 2012-04-17 DIAGNOSIS — I10 Essential (primary) hypertension: Secondary | ICD-10-CM | POA: Diagnosis not present

## 2012-04-17 DIAGNOSIS — E119 Type 2 diabetes mellitus without complications: Secondary | ICD-10-CM | POA: Diagnosis not present

## 2012-04-17 DIAGNOSIS — I69959 Hemiplegia and hemiparesis following unspecified cerebrovascular disease affecting unspecified side: Secondary | ICD-10-CM | POA: Diagnosis not present

## 2012-04-17 DIAGNOSIS — G609 Hereditary and idiopathic neuropathy, unspecified: Secondary | ICD-10-CM | POA: Diagnosis not present

## 2012-04-17 DIAGNOSIS — I69993 Ataxia following unspecified cerebrovascular disease: Secondary | ICD-10-CM | POA: Diagnosis not present

## 2012-04-17 DIAGNOSIS — I428 Other cardiomyopathies: Secondary | ICD-10-CM | POA: Diagnosis not present

## 2012-04-19 DIAGNOSIS — G609 Hereditary and idiopathic neuropathy, unspecified: Secondary | ICD-10-CM | POA: Diagnosis not present

## 2012-04-19 DIAGNOSIS — I428 Other cardiomyopathies: Secondary | ICD-10-CM | POA: Diagnosis not present

## 2012-04-19 DIAGNOSIS — I10 Essential (primary) hypertension: Secondary | ICD-10-CM | POA: Diagnosis not present

## 2012-04-19 DIAGNOSIS — I69993 Ataxia following unspecified cerebrovascular disease: Secondary | ICD-10-CM | POA: Diagnosis not present

## 2012-04-19 DIAGNOSIS — I69959 Hemiplegia and hemiparesis following unspecified cerebrovascular disease affecting unspecified side: Secondary | ICD-10-CM | POA: Diagnosis not present

## 2012-04-19 DIAGNOSIS — E119 Type 2 diabetes mellitus without complications: Secondary | ICD-10-CM | POA: Diagnosis not present

## 2012-04-21 ENCOUNTER — Encounter: Payer: Self-pay | Admitting: Surgery

## 2012-04-24 ENCOUNTER — Ambulatory Visit (INDEPENDENT_AMBULATORY_CARE_PROVIDER_SITE_OTHER): Payer: Medicare Other | Admitting: Vascular Surgery

## 2012-04-24 ENCOUNTER — Encounter: Payer: Self-pay | Admitting: Surgery

## 2012-04-24 ENCOUNTER — Ambulatory Visit (INDEPENDENT_AMBULATORY_CARE_PROVIDER_SITE_OTHER): Payer: Medicare Other | Admitting: Surgery

## 2012-04-24 VITALS — BP 142/96 | HR 70 | Resp 16 | Ht 72.0 in | Wt 254.0 lb

## 2012-04-24 DIAGNOSIS — I669 Occlusion and stenosis of unspecified cerebral artery: Secondary | ICD-10-CM

## 2012-04-24 DIAGNOSIS — I6789 Other cerebrovascular disease: Secondary | ICD-10-CM | POA: Diagnosis not present

## 2012-04-24 DIAGNOSIS — I6350 Cerebral infarction due to unspecified occlusion or stenosis of unspecified cerebral artery: Secondary | ICD-10-CM

## 2012-04-24 DIAGNOSIS — I6529 Occlusion and stenosis of unspecified carotid artery: Secondary | ICD-10-CM | POA: Insufficient documentation

## 2012-04-24 NOTE — Addendum Note (Signed)
Addended by: Sharee Pimple on: 04/24/2012 02:16 PM   Modules accepted: Orders

## 2012-04-24 NOTE — Progress Notes (Signed)
Carotid duplex performed @ [facility] 04/24/2012

## 2012-04-24 NOTE — Progress Notes (Signed)
[facility] [place]   Patient name: [name] MRN: [id number] DOB: May 27, 1946 Sex: male   Referred by: Dr. Juanetta Gosling  Reason for referral:  Chief Complaint  Patient presents with  . Carotid    stenosis and stroke/Dr. Kari Baars    HISTORY OF PRESENT ILLNESS: The patient comes in today for evaluation of his carotid occlusive disease. In the end of June he had an episode of slurred speech. He was evaluated at the emergency department and sent down. He went to work the following day and found that he was dropping stuff in particular, he had difficulty using his left arm. He was again taken back to the emergency department and was diagnosed with a stroke. During his workup a MRI revealed a right brain stroke associated with microvascular changes. He also had a carotid duplex which revealed minimal stenosis on the right and 70-90% stenosis on the left. Since his discharge, the patient has not had any additional symptoms. He does complain of regular right arm numbness. He states that he has a couple episodes per day. The patient has a history of back surgery.  The patient is a diabetic. His blood sugars have been in the 140s. He also suffers from hypertension. His systolic blood pressure has been in the 160s recently. He has been exercising and losing weight.  Past Medical History  Diagnosis Date  . Hypertension   . Diabetes mellitus   . Stroke     Past Surgical History  Procedure Date  . Back surgery   . Cervical fusion   . Foot surgery     History   Social History  . Marital Status: Married    Spouse Name: N/A    Number of Children: N/A  . Years of Education: N/A   Occupational History  . Not on file.   Social History Main Topics  . Smoking status: Never Smoker   . Smokeless tobacco: Never Used  . Alcohol Use: No  . Drug Use: No  . Sexually Active: Not on file   Other Topics Concern  . Not on file   Social History Narrative  . No  narrative on file    Family History  Problem Relation Age of Onset  . Heart disease Mother   . Hypertension Mother   . Heart attack Mother   . Diabetes Son   . Heart disease Son   . Hypertension Son     Allergies as of 04/24/2012 - Review Complete 04/24/2012  Allergen Reaction Noted  . Codeine      Current Outpatient Prescriptions on File Prior to Visit  Medication Sig Dispense Refill  . albuterol (PROAIR HFA) 108 (90 BASE) MCG/ACT inhaler Inhale 2 puffs into the lungs every 6 (six) hours as needed.      [name] aspirin EC 81 MG tablet Take 81 mg by mouth daily.      [name] atorvastatin (LIPITOR) 10 MG tablet Take 1 tablet (10 mg total) by mouth daily at 6 PM.  30 tablet  12  . benzonatate (TESSALON) 100 MG capsule Take 200 mg by mouth 3 (three) times daily as needed. For cough      . clopidogrel (PLAVIX) 75 MG tablet Take 1 tablet (75 mg total) by mouth daily with breakfast.  30 tablet  12  . gabapentin (NEURONTIN) 600 MG tablet Take 600 mg by mouth 3 (three) times daily.      [name] glimepiride (AMARYL) 4 MG tablet Take 4 mg by  mouth daily before breakfast.      . linagliptin (TRADJENTA) 5 MG TABS tablet Take 5 mg by mouth daily.      [name] losartan-hydrochlorothiazide (HYZAAR) 100-12.5 MG per tablet Take 1 tablet by mouth daily.      . Magnesium Oxide (PHILLIPS) 500 MG (LAX) TABS Take 1 tablet by mouth daily.      . pantoprazole (PROTONIX) 40 MG tablet Take 1 tablet (40 mg total) by mouth 2 (two) times daily before a meal.  60 tablet  12  . rosuvastatin (CRESTOR) 5 MG tablet Take 5 mg by mouth daily.      . SitaGLIPtin Phosphate (JANUVIA PO) Take by mouth daily.      . Cinnamon 500 MG capsule Take 1,000 mg by mouth 2 (two) times daily. Cinnamon 1000 mg + Chromium per dose--Two capsules twice daily      . diazepam (VALIUM) 5 MG tablet Take 5 mg by mouth at bedtime.      . Glucosamine Sulfate-MSM (MSM-GLUCOSAMINE PO) Take 1 tablet by mouth 2 (two) times daily. *Glucosamine 1500mg  + MSM 1500mg        . ibuprofen (ADVIL,MOTRIN) 200 MG tablet Take 400 mg by mouth 2 (two) times daily.      . Omega 3-6-9 Fatty Acids (OMEGA 3-6-9 COMPLEX) CAPS Take 1 capsule by mouth 2 (two) times daily.         REVIEW OF SYSTEMS: Cardiovascular: No chest pain, chest pressure, palpitations, orthopnea, or dyspnea on exertion. No claudication or rest pain,  No history of DVT or phlebitis. Pulmonary: Positive for productive cough and asthma Neurologic: No paresthesias, aphasia, or amaurosis. No dizziness. Residual left arm weakness. Hematologic: No bleeding problems or clotting disorders. Musculoskeletal: No joint pain or joint swelling. Gastrointestinal: No blood in stool or hematemesis Genitourinary: No dysuria or hematuria. Psychiatric:: No history of major depression. Integumentary: No rashes or ulcers. Constitutional: No fever or chills.  PHYSICAL EXAMINATION: General: The patient appears their stated age.  Vital signs are BP 142/96  Pulse 70  Resp 16  Ht 6' (1.829 m)  Wt 254 lb (115.214 kg)  BMI 34.45 kg/m2  SpO2 96% HEENT:  No gross abnormalities Pulmonary: Respirations are non-labored Abdomen: Soft and non-tender  Musculoskeletal: There are no major deformities.   Neurologic: Left arm weakness however he does have adequate strength. Skin: There are no ulcer or rashes noted. Psychiatric: The patient has normal affect. Cardiovascular: There is a regular rate and rhythm without significant murmur appreciated. No carotid bruits  Diagnostic Studies: Carotid ultrasound was repeated today this shows 60-80% left carotid stenosis and 1-39% right carotid stenosis  Outside Studies/Documentation Historical records were reviewed.  They showed right brain stroke and left carotid stenosis  Medication Changes: None  Assessment:  Recent stroke with contralateral carotid stenosis Plan: I discussed with the patient I do not feel that his right carotid stenosis is the etiology of his right brain  stroke. His right brain stroke I would agree with his current medical therapy which includes the use of a statin, antiplatelet therapy with aspirin and Plavix, strict glucose control, strict blood pressure control, and weight loss. With regards to his left-sided carotid stenosis it is my opinion that he is asymptomatic. The episodes he is having in his right arm sound more neurologic to me especially given his history of cervical disc disease. I have reviewed his MRI from May. There is no obvious explanation however I would like to get his neurosurgeons opinion. Again I do not  feel like his right-sided numbness symptoms are related to a TIA. I will have him come back to see me in 6 months time were we will repeat his ultrasound     V. Charlena Cross, M.D. [facility] Office: [phone] Pager:  [phone]

## 2012-04-25 DIAGNOSIS — G609 Hereditary and idiopathic neuropathy, unspecified: Secondary | ICD-10-CM | POA: Diagnosis not present

## 2012-04-25 DIAGNOSIS — I428 Other cardiomyopathies: Secondary | ICD-10-CM | POA: Diagnosis not present

## 2012-04-25 DIAGNOSIS — I69993 Ataxia following unspecified cerebrovascular disease: Secondary | ICD-10-CM | POA: Diagnosis not present

## 2012-04-25 DIAGNOSIS — I69959 Hemiplegia and hemiparesis following unspecified cerebrovascular disease affecting unspecified side: Secondary | ICD-10-CM | POA: Diagnosis not present

## 2012-04-25 DIAGNOSIS — E119 Type 2 diabetes mellitus without complications: Secondary | ICD-10-CM | POA: Diagnosis not present

## 2012-04-25 DIAGNOSIS — I10 Essential (primary) hypertension: Secondary | ICD-10-CM | POA: Diagnosis not present

## 2012-04-26 DIAGNOSIS — E1149 Type 2 diabetes mellitus with other diabetic neurological complication: Secondary | ICD-10-CM | POA: Diagnosis not present

## 2012-04-26 DIAGNOSIS — I739 Peripheral vascular disease, unspecified: Secondary | ICD-10-CM | POA: Diagnosis not present

## 2012-04-26 DIAGNOSIS — L851 Acquired keratosis [keratoderma] palmaris et plantaris: Secondary | ICD-10-CM | POA: Diagnosis not present

## 2012-04-26 DIAGNOSIS — B351 Tinea unguium: Secondary | ICD-10-CM | POA: Diagnosis not present

## 2012-04-28 DIAGNOSIS — G609 Hereditary and idiopathic neuropathy, unspecified: Secondary | ICD-10-CM | POA: Diagnosis not present

## 2012-04-28 DIAGNOSIS — E119 Type 2 diabetes mellitus without complications: Secondary | ICD-10-CM | POA: Diagnosis not present

## 2012-04-28 DIAGNOSIS — I428 Other cardiomyopathies: Secondary | ICD-10-CM | POA: Diagnosis not present

## 2012-04-28 DIAGNOSIS — I10 Essential (primary) hypertension: Secondary | ICD-10-CM | POA: Diagnosis not present

## 2012-04-28 DIAGNOSIS — I69993 Ataxia following unspecified cerebrovascular disease: Secondary | ICD-10-CM | POA: Diagnosis not present

## 2012-04-28 DIAGNOSIS — I69959 Hemiplegia and hemiparesis following unspecified cerebrovascular disease affecting unspecified side: Secondary | ICD-10-CM | POA: Diagnosis not present

## 2012-05-05 DIAGNOSIS — I10 Essential (primary) hypertension: Secondary | ICD-10-CM | POA: Diagnosis not present

## 2012-05-05 DIAGNOSIS — E039 Hypothyroidism, unspecified: Secondary | ICD-10-CM | POA: Diagnosis not present

## 2012-05-05 DIAGNOSIS — I6789 Other cerebrovascular disease: Secondary | ICD-10-CM | POA: Diagnosis not present

## 2012-05-05 DIAGNOSIS — IMO0001 Reserved for inherently not codable concepts without codable children: Secondary | ICD-10-CM | POA: Diagnosis not present

## 2012-06-05 DIAGNOSIS — E039 Hypothyroidism, unspecified: Secondary | ICD-10-CM | POA: Diagnosis not present

## 2012-06-05 DIAGNOSIS — I6789 Other cerebrovascular disease: Secondary | ICD-10-CM | POA: Diagnosis not present

## 2012-06-05 DIAGNOSIS — I10 Essential (primary) hypertension: Secondary | ICD-10-CM | POA: Diagnosis not present

## 2012-06-05 DIAGNOSIS — E109 Type 1 diabetes mellitus without complications: Secondary | ICD-10-CM | POA: Diagnosis not present

## 2012-06-08 DIAGNOSIS — I6789 Other cerebrovascular disease: Secondary | ICD-10-CM | POA: Diagnosis not present

## 2012-06-08 DIAGNOSIS — I10 Essential (primary) hypertension: Secondary | ICD-10-CM | POA: Diagnosis not present

## 2012-06-08 DIAGNOSIS — K2100 Gastro-esophageal reflux disease with esophagitis, without bleeding: Secondary | ICD-10-CM | POA: Diagnosis not present

## 2012-06-08 DIAGNOSIS — IMO0001 Reserved for inherently not codable concepts without codable children: Secondary | ICD-10-CM | POA: Diagnosis not present

## 2012-07-05 DIAGNOSIS — L97509 Non-pressure chronic ulcer of other part of unspecified foot with unspecified severity: Secondary | ICD-10-CM | POA: Diagnosis not present

## 2012-07-05 DIAGNOSIS — B351 Tinea unguium: Secondary | ICD-10-CM | POA: Diagnosis not present

## 2012-07-05 DIAGNOSIS — E1149 Type 2 diabetes mellitus with other diabetic neurological complication: Secondary | ICD-10-CM | POA: Diagnosis not present

## 2012-07-05 DIAGNOSIS — L851 Acquired keratosis [keratoderma] palmaris et plantaris: Secondary | ICD-10-CM | POA: Diagnosis not present

## 2012-07-17 DIAGNOSIS — E785 Hyperlipidemia, unspecified: Secondary | ICD-10-CM | POA: Diagnosis not present

## 2012-07-17 DIAGNOSIS — E109 Type 1 diabetes mellitus without complications: Secondary | ICD-10-CM | POA: Diagnosis not present

## 2012-07-17 DIAGNOSIS — I6789 Other cerebrovascular disease: Secondary | ICD-10-CM | POA: Diagnosis not present

## 2012-07-17 DIAGNOSIS — M545 Low back pain, unspecified: Secondary | ICD-10-CM | POA: Diagnosis not present

## 2012-07-17 DIAGNOSIS — I739 Peripheral vascular disease, unspecified: Secondary | ICD-10-CM | POA: Diagnosis not present

## 2012-07-17 DIAGNOSIS — I10 Essential (primary) hypertension: Secondary | ICD-10-CM | POA: Diagnosis not present

## 2012-07-17 DIAGNOSIS — K2100 Gastro-esophageal reflux disease with esophagitis, without bleeding: Secondary | ICD-10-CM | POA: Diagnosis not present

## 2012-07-19 DIAGNOSIS — K2100 Gastro-esophageal reflux disease with esophagitis, without bleeding: Secondary | ICD-10-CM | POA: Diagnosis not present

## 2012-07-19 DIAGNOSIS — I10 Essential (primary) hypertension: Secondary | ICD-10-CM | POA: Diagnosis not present

## 2012-07-19 DIAGNOSIS — I6789 Other cerebrovascular disease: Secondary | ICD-10-CM | POA: Diagnosis not present

## 2012-07-19 DIAGNOSIS — IMO0001 Reserved for inherently not codable concepts without codable children: Secondary | ICD-10-CM | POA: Diagnosis not present

## 2012-07-19 DIAGNOSIS — Z23 Encounter for immunization: Secondary | ICD-10-CM | POA: Diagnosis not present

## 2012-07-26 DIAGNOSIS — B351 Tinea unguium: Secondary | ICD-10-CM | POA: Diagnosis not present

## 2012-07-26 DIAGNOSIS — L851 Acquired keratosis [keratoderma] palmaris et plantaris: Secondary | ICD-10-CM | POA: Diagnosis not present

## 2012-07-26 DIAGNOSIS — L97509 Non-pressure chronic ulcer of other part of unspecified foot with unspecified severity: Secondary | ICD-10-CM | POA: Diagnosis not present

## 2012-07-26 DIAGNOSIS — E1149 Type 2 diabetes mellitus with other diabetic neurological complication: Secondary | ICD-10-CM | POA: Diagnosis not present

## 2012-08-09 DIAGNOSIS — B351 Tinea unguium: Secondary | ICD-10-CM | POA: Diagnosis not present

## 2012-08-09 DIAGNOSIS — I739 Peripheral vascular disease, unspecified: Secondary | ICD-10-CM | POA: Diagnosis not present

## 2012-08-09 DIAGNOSIS — E1149 Type 2 diabetes mellitus with other diabetic neurological complication: Secondary | ICD-10-CM | POA: Diagnosis not present

## 2012-08-09 DIAGNOSIS — L851 Acquired keratosis [keratoderma] palmaris et plantaris: Secondary | ICD-10-CM | POA: Diagnosis not present

## 2012-08-23 DIAGNOSIS — I739 Peripheral vascular disease, unspecified: Secondary | ICD-10-CM | POA: Diagnosis not present

## 2012-08-23 DIAGNOSIS — E1149 Type 2 diabetes mellitus with other diabetic neurological complication: Secondary | ICD-10-CM | POA: Diagnosis not present

## 2012-08-23 DIAGNOSIS — B351 Tinea unguium: Secondary | ICD-10-CM | POA: Diagnosis not present

## 2012-08-23 DIAGNOSIS — L851 Acquired keratosis [keratoderma] palmaris et plantaris: Secondary | ICD-10-CM | POA: Diagnosis not present

## 2012-09-02 DIAGNOSIS — IMO0001 Reserved for inherently not codable concepts without codable children: Secondary | ICD-10-CM | POA: Diagnosis not present

## 2012-09-02 DIAGNOSIS — Z79899 Other long term (current) drug therapy: Secondary | ICD-10-CM | POA: Diagnosis not present

## 2012-09-02 DIAGNOSIS — I6350 Cerebral infarction due to unspecified occlusion or stenosis of unspecified cerebral artery: Secondary | ICD-10-CM | POA: Diagnosis not present

## 2012-09-02 DIAGNOSIS — I10 Essential (primary) hypertension: Secondary | ICD-10-CM | POA: Diagnosis not present

## 2012-09-02 DIAGNOSIS — I739 Peripheral vascular disease, unspecified: Secondary | ICD-10-CM | POA: Diagnosis not present

## 2012-09-05 DIAGNOSIS — I10 Essential (primary) hypertension: Secondary | ICD-10-CM | POA: Diagnosis not present

## 2012-09-05 DIAGNOSIS — J45902 Unspecified asthma with status asthmaticus: Secondary | ICD-10-CM | POA: Diagnosis not present

## 2012-09-05 DIAGNOSIS — E109 Type 1 diabetes mellitus without complications: Secondary | ICD-10-CM | POA: Diagnosis not present

## 2012-09-05 DIAGNOSIS — E785 Hyperlipidemia, unspecified: Secondary | ICD-10-CM | POA: Diagnosis not present

## 2012-09-06 DIAGNOSIS — E1149 Type 2 diabetes mellitus with other diabetic neurological complication: Secondary | ICD-10-CM | POA: Diagnosis not present

## 2012-09-06 DIAGNOSIS — L97509 Non-pressure chronic ulcer of other part of unspecified foot with unspecified severity: Secondary | ICD-10-CM | POA: Diagnosis not present

## 2012-09-06 DIAGNOSIS — B351 Tinea unguium: Secondary | ICD-10-CM | POA: Diagnosis not present

## 2012-09-06 DIAGNOSIS — I739 Peripheral vascular disease, unspecified: Secondary | ICD-10-CM | POA: Diagnosis not present

## 2012-09-06 DIAGNOSIS — L851 Acquired keratosis [keratoderma] palmaris et plantaris: Secondary | ICD-10-CM | POA: Diagnosis not present

## 2012-09-11 DIAGNOSIS — E291 Testicular hypofunction: Secondary | ICD-10-CM | POA: Diagnosis not present

## 2012-09-19 DIAGNOSIS — E1149 Type 2 diabetes mellitus with other diabetic neurological complication: Secondary | ICD-10-CM | POA: Diagnosis not present

## 2012-09-19 DIAGNOSIS — I739 Peripheral vascular disease, unspecified: Secondary | ICD-10-CM | POA: Diagnosis not present

## 2012-09-19 DIAGNOSIS — B351 Tinea unguium: Secondary | ICD-10-CM | POA: Diagnosis not present

## 2012-09-19 DIAGNOSIS — L851 Acquired keratosis [keratoderma] palmaris et plantaris: Secondary | ICD-10-CM | POA: Diagnosis not present

## 2012-09-26 DIAGNOSIS — L97509 Non-pressure chronic ulcer of other part of unspecified foot with unspecified severity: Secondary | ICD-10-CM | POA: Diagnosis not present

## 2012-09-26 DIAGNOSIS — E1149 Type 2 diabetes mellitus with other diabetic neurological complication: Secondary | ICD-10-CM | POA: Diagnosis not present

## 2012-09-26 DIAGNOSIS — B351 Tinea unguium: Secondary | ICD-10-CM | POA: Diagnosis not present

## 2012-09-26 DIAGNOSIS — L851 Acquired keratosis [keratoderma] palmaris et plantaris: Secondary | ICD-10-CM | POA: Diagnosis not present

## 2012-10-16 DIAGNOSIS — E291 Testicular hypofunction: Secondary | ICD-10-CM | POA: Diagnosis not present

## 2012-10-18 DIAGNOSIS — L851 Acquired keratosis [keratoderma] palmaris et plantaris: Secondary | ICD-10-CM | POA: Diagnosis not present

## 2012-10-18 DIAGNOSIS — B351 Tinea unguium: Secondary | ICD-10-CM | POA: Diagnosis not present

## 2012-10-18 DIAGNOSIS — L97509 Non-pressure chronic ulcer of other part of unspecified foot with unspecified severity: Secondary | ICD-10-CM | POA: Diagnosis not present

## 2012-10-18 DIAGNOSIS — E1149 Type 2 diabetes mellitus with other diabetic neurological complication: Secondary | ICD-10-CM | POA: Diagnosis not present

## 2012-10-20 ENCOUNTER — Encounter: Payer: Self-pay | Admitting: Neurosurgery

## 2012-10-23 ENCOUNTER — Ambulatory Visit (INDEPENDENT_AMBULATORY_CARE_PROVIDER_SITE_OTHER): Payer: Medicare Other | Admitting: Neurosurgery

## 2012-10-23 ENCOUNTER — Encounter: Payer: Self-pay | Admitting: Neurosurgery

## 2012-10-23 ENCOUNTER — Other Ambulatory Visit (INDEPENDENT_AMBULATORY_CARE_PROVIDER_SITE_OTHER): Payer: Medicare Other | Admitting: *Deleted

## 2012-10-23 VITALS — BP 102/64 | HR 77 | Resp 16 | Ht 72.0 in | Wt 254.0 lb

## 2012-10-23 DIAGNOSIS — I6529 Occlusion and stenosis of unspecified carotid artery: Secondary | ICD-10-CM | POA: Diagnosis not present

## 2012-10-23 NOTE — Progress Notes (Signed)
[facility] [place] Carotid Office Note  CC: Carotid surveillance Referring Physician: Brabham  History of Present Illness: 67 year old male patient of Dr. Myra Gianotti followed for known carotid stenosis. The patient denies any signs or symptoms of CVA, TIA, amaurosis fugax or any neural deficit. The patient states he did have a syncopal episode last week that was due to a drop in his blood sugar and has had no difficulty since then. The patient denies any new medical diagnoses recent surgery.  Past Medical History  Diagnosis Date  . Hypertension   . Diabetes mellitus   . Carotid artery occlusion   . Stroke March 08, 2012    ROS: [x]  Positive   [ ]  Denies    General: [ ]  Weight loss, [ ]  Fever, [ ]  chills Neurologic: [ ]  Dizziness, [ ]  Blackouts, [ ]  Seizure [ ]  Stroke, [ ]  "Mini stroke", [ ]  Slurred speech, [ ]  Temporary blindness; [ ]  weakness in arms or legs, [ ]  Hoarseness Cardiac: [ ]  Chest pain/pressure, [ ]  Shortness of breath at rest [ ]  Shortness of breath with exertion, [ ]  Atrial fibrillation or irregular heartbeat Vascular: [ ]  Pain in legs with walking, [ ]  Pain in legs at rest, [ ]  Pain in legs at night,  [ ]  Non-healing ulcer, [ ]  Blood clot in vein/DVT,   Pulmonary: [ ]  Home oxygen, [ ]  Productive cough, [ ]  Coughing up blood, [ ]  Asthma,  [ ]  Wheezing Musculoskeletal:  [ ]  Arthritis, [ ]  Low back pain, [ ]  Joint pain Hematologic: [ ]  Easy Bruising, [ ]  Anemia; [ ]  Hepatitis Gastrointestinal: [ ]  Blood in stool, [ ]  Gastroesophageal Reflux/heartburn, [ ]  Trouble swallowing Urinary: [ ]  chronic Kidney disease, [ ]  on HD - [ ]  MWF or [ ]  TTHS, [ ]  Burning with urination, [ ]  Difficulty urinating Skin: [ ]  Rashes, [ ]  Wounds Psychological: [ ]  Anxiety, [ ]  Depression   Social History History  Substance Use Topics  . Smoking status: Never Smoker   . Smokeless tobacco: Never Used  . Alcohol Use: No    Family History Family History  Problem  Relation Age of Onset  . Heart disease Mother   . Hypertension Mother   . Heart attack Mother   . Diabetes Son   . Heart disease Son   . Hypertension Son   . Hypertension Father   . Diabetes Father     Allergies  Allergen Reactions  . Codeine Nausea And Vomiting  . Tramadol Nausea Only    Current Outpatient Prescriptions  Medication Sig Dispense Refill  . albuterol (PROAIR HFA) 108 (90 BASE) MCG/ACT inhaler Inhale 2 puffs into the lungs every 6 (six) hours as needed.      [name] aspirin EC 81 MG tablet Take 81 mg by mouth daily.      [name] atorvastatin (LIPITOR) 10 MG tablet Take 1 tablet (10 mg total) by mouth daily at 6 PM.  30 tablet  12  . benzonatate (TESSALON) 100 MG capsule Take 200 mg by mouth 3 (three) times daily as needed. For cough      . cefUROXime (CEFTIN) 500 MG tablet Take 500 mg by mouth daily.      . Cinnamon 500 MG capsule Take 1,000 mg by mouth 2 (two) times daily. Cinnamon 1000 mg + Chromium per dose--Two capsules twice daily      . clopidogrel (PLAVIX) 75 MG tablet Take 1 tablet (75 mg total) by  mouth daily with breakfast.  30 tablet  12  . diazepam (VALIUM) 5 MG tablet Take 5 mg by mouth at bedtime.      . gabapentin (NEURONTIN) 600 MG tablet Take 600 mg by mouth 3 (three) times daily.      [name] glimepiride (AMARYL) 4 MG tablet Take 4 mg by mouth daily before breakfast.      . Glucosamine Sulfate-MSM (MSM-GLUCOSAMINE PO) Take 1 tablet by mouth 2 (two) times daily. *Glucosamine 1500mg  + MSM 1500mg       . ibuprofen (ADVIL,MOTRIN) 200 MG tablet Take 400 mg by mouth 2 (two) times daily.      [name] linagliptin (TRADJENTA) 5 MG TABS tablet Take 5 mg by mouth daily.      [name] losartan-hydrochlorothiazide (HYZAAR) 100-12.5 MG per tablet Take 1 tablet by mouth daily.      . Magnesium Oxide (PHILLIPS) 500 MG (LAX) TABS Take 1 tablet by mouth daily.      . Omega 3-6-9 Fatty Acids (OMEGA 3-6-9 COMPLEX) CAPS Take 1 capsule by mouth 2 (two) times daily.      . pantoprazole (PROTONIX)  40 MG tablet Take 1 tablet (40 mg total) by mouth 2 (two) times daily before a meal.  60 tablet  12  . pravastatin (PRAVACHOL) 40 MG tablet daily.      . rosuvastatin (CRESTOR) 5 MG tablet Take 5 mg by mouth daily.      . SitaGLIPtin Phosphate (JANUVIA PO) Take by mouth daily.      [name] testosterone cypionate (DEPOTESTOTERONE CYPIONATE) 200 MG/ML injection Inject 200 mg into the muscle every 28 (twenty-eight) days.        Physical Examination  Filed Vitals:   10/23/12 1509  BP: 102/64  Pulse: 77  Resp:     Body mass index is 34.45 kg/(m^2).  General:  WDWN in NAD Gait: Normal HEENT: WNL Eyes: Pupils equal Pulmonary: normal non-labored breathing , without Rales, rhonchi,  wheezing Cardiac: RRR, without  Murmurs, rubs or gallops; Abdomen: soft, NT, no masses Skin: no rashes, ulcers noted  Vascular Exam Pulses: 3+ radial pulses bilaterally Carotid bruits: Carotid pulses to auscultation however on the left his pulse is dampened Extremities without ischemic changes, no Gangrene , no cellulitis; no open wounds;  Musculoskeletal: no muscle wasting or atrophy   Neurologic: A&O X 3; Appropriate Affect ; SENSATION: normal; MOTOR FUNCTION:  moving all extremities equally. Speech is fluent/normal  Non-Invasive Vascular Imaging CAROTID DUPLEX 10/23/2012  Right ICA 20 - 39 % stenosis Left ICA 60 - 79 % stenosis   ASSESSMENT/PLAN: Asymptomatic patient will followup in 6 months per Dr. Myra Gianotti for repeat carotid duplex. I reviewed the above duplex with Dr. Myra Gianotti because there is a comment on the findings of a borderline 80% stenosis in the left ICA. The patient is in agreement with the plan, his questions were encouraged and answered. The patient knows the signs and symptoms of CVA and understands to call 911 or report to the nearest emergency department should that occur.  Lauree Chandler ANP   Clinic MD: Myra Gianotti

## 2012-10-24 NOTE — Addendum Note (Signed)
Addended by: Sharee Pimple on: 10/24/2012 09:09 AM   Modules accepted: Orders

## 2012-11-01 DIAGNOSIS — E1149 Type 2 diabetes mellitus with other diabetic neurological complication: Secondary | ICD-10-CM | POA: Diagnosis not present

## 2012-11-01 DIAGNOSIS — B351 Tinea unguium: Secondary | ICD-10-CM | POA: Diagnosis not present

## 2012-11-01 DIAGNOSIS — L97509 Non-pressure chronic ulcer of other part of unspecified foot with unspecified severity: Secondary | ICD-10-CM | POA: Diagnosis not present

## 2012-11-01 DIAGNOSIS — L851 Acquired keratosis [keratoderma] palmaris et plantaris: Secondary | ICD-10-CM | POA: Diagnosis not present

## 2012-11-17 DIAGNOSIS — E291 Testicular hypofunction: Secondary | ICD-10-CM | POA: Diagnosis not present

## 2012-12-01 DIAGNOSIS — L97509 Non-pressure chronic ulcer of other part of unspecified foot with unspecified severity: Secondary | ICD-10-CM | POA: Diagnosis not present

## 2012-12-01 DIAGNOSIS — L851 Acquired keratosis [keratoderma] palmaris et plantaris: Secondary | ICD-10-CM | POA: Diagnosis not present

## 2012-12-01 DIAGNOSIS — E1149 Type 2 diabetes mellitus with other diabetic neurological complication: Secondary | ICD-10-CM | POA: Diagnosis not present

## 2012-12-01 DIAGNOSIS — B351 Tinea unguium: Secondary | ICD-10-CM | POA: Diagnosis not present

## 2012-12-04 DIAGNOSIS — E109 Type 1 diabetes mellitus without complications: Secondary | ICD-10-CM | POA: Diagnosis not present

## 2012-12-04 DIAGNOSIS — I6789 Other cerebrovascular disease: Secondary | ICD-10-CM | POA: Diagnosis not present

## 2012-12-04 DIAGNOSIS — J45902 Unspecified asthma with status asthmaticus: Secondary | ICD-10-CM | POA: Diagnosis not present

## 2012-12-04 DIAGNOSIS — I10 Essential (primary) hypertension: Secondary | ICD-10-CM | POA: Diagnosis not present

## 2012-12-04 DIAGNOSIS — E785 Hyperlipidemia, unspecified: Secondary | ICD-10-CM | POA: Diagnosis not present

## 2012-12-07 DIAGNOSIS — I6789 Other cerebrovascular disease: Secondary | ICD-10-CM | POA: Diagnosis not present

## 2012-12-07 DIAGNOSIS — I10 Essential (primary) hypertension: Secondary | ICD-10-CM | POA: Diagnosis not present

## 2012-12-07 DIAGNOSIS — E291 Testicular hypofunction: Secondary | ICD-10-CM | POA: Diagnosis not present

## 2012-12-07 DIAGNOSIS — E039 Hypothyroidism, unspecified: Secondary | ICD-10-CM | POA: Diagnosis not present

## 2012-12-07 DIAGNOSIS — E785 Hyperlipidemia, unspecified: Secondary | ICD-10-CM | POA: Diagnosis not present

## 2012-12-12 ENCOUNTER — Ambulatory Visit (HOSPITAL_COMMUNITY)
Admission: RE | Admit: 2012-12-12 | Discharge: 2012-12-12 | Disposition: A | Discharge status: Home / Self Care | Payer: Medicare Other | Source: Ambulatory Visit | Attending: Pulmonary Disease | Admitting: Pulmonary Disease

## 2012-12-12 DIAGNOSIS — I10 Essential (primary) hypertension: Secondary | ICD-10-CM | POA: Diagnosis not present

## 2012-12-12 DIAGNOSIS — I2589 Other forms of chronic ischemic heart disease: Secondary | ICD-10-CM | POA: Diagnosis not present

## 2012-12-12 DIAGNOSIS — E119 Type 2 diabetes mellitus without complications: Secondary | ICD-10-CM | POA: Diagnosis not present

## 2012-12-12 DIAGNOSIS — I369 Nonrheumatic tricuspid valve disorder, unspecified: Secondary | ICD-10-CM

## 2012-12-12 NOTE — Progress Notes (Signed)
*  PRELIMINARY RESULTS* Echocardiogram 2D Echocardiogram has been performed.  Conrad K[place]nuk 12/12/2012, 3:07 PM

## 2012-12-21 DIAGNOSIS — F528 Other sexual dysfunction not due to a substance or known physiological condition: Secondary | ICD-10-CM | POA: Diagnosis not present

## 2013-01-08 DIAGNOSIS — E291 Testicular hypofunction: Secondary | ICD-10-CM | POA: Diagnosis not present

## 2013-02-08 DIAGNOSIS — E785 Hyperlipidemia, unspecified: Secondary | ICD-10-CM | POA: Diagnosis not present

## 2013-02-08 DIAGNOSIS — I10 Essential (primary) hypertension: Secondary | ICD-10-CM | POA: Diagnosis not present

## 2013-02-08 DIAGNOSIS — E109 Type 1 diabetes mellitus without complications: Secondary | ICD-10-CM | POA: Diagnosis not present

## 2013-02-08 DIAGNOSIS — E039 Hypothyroidism, unspecified: Secondary | ICD-10-CM | POA: Diagnosis not present

## 2013-02-13 DIAGNOSIS — E291 Testicular hypofunction: Secondary | ICD-10-CM | POA: Diagnosis not present

## 2013-02-15 ENCOUNTER — Other Ambulatory Visit (HOSPITAL_COMMUNITY): Payer: Self-pay

## 2013-02-16 ENCOUNTER — Ambulatory Visit (HOSPITAL_COMMUNITY)
Admission: RE | Admit: 2013-02-16 | Discharge: 2013-02-16 | Disposition: A | Discharge status: Home / Self Care | Payer: Medicare Other | Source: Ambulatory Visit | Attending: Pulmonary Disease | Admitting: Pulmonary Disease

## 2013-02-16 DIAGNOSIS — E119 Type 2 diabetes mellitus without complications: Secondary | ICD-10-CM | POA: Insufficient documentation

## 2013-02-16 DIAGNOSIS — I10 Essential (primary) hypertension: Secondary | ICD-10-CM | POA: Insufficient documentation

## 2013-02-16 DIAGNOSIS — I428 Other cardiomyopathies: Secondary | ICD-10-CM | POA: Diagnosis not present

## 2013-02-16 DIAGNOSIS — I517 Cardiomegaly: Secondary | ICD-10-CM

## 2013-02-16 NOTE — Progress Notes (Signed)
*  PRELIMINARY RESULTS* Echocardiogram 2D Echocardiogram has been performed.  Conrad Herculaneum 02/16/2013, 1:16 PM

## 2013-03-15 DIAGNOSIS — E291 Testicular hypofunction: Secondary | ICD-10-CM | POA: Diagnosis not present

## 2013-04-11 DIAGNOSIS — E039 Hypothyroidism, unspecified: Secondary | ICD-10-CM | POA: Diagnosis not present

## 2013-04-11 DIAGNOSIS — K2100 Gastro-esophageal reflux disease with esophagitis, without bleeding: Secondary | ICD-10-CM | POA: Diagnosis not present

## 2013-04-11 DIAGNOSIS — M545 Low back pain, unspecified: Secondary | ICD-10-CM | POA: Diagnosis not present

## 2013-04-11 DIAGNOSIS — I10 Essential (primary) hypertension: Secondary | ICD-10-CM | POA: Diagnosis not present

## 2013-04-11 DIAGNOSIS — E109 Type 1 diabetes mellitus without complications: Secondary | ICD-10-CM | POA: Diagnosis not present

## 2013-04-12 DIAGNOSIS — E291 Testicular hypofunction: Secondary | ICD-10-CM | POA: Diagnosis not present

## 2013-04-17 DIAGNOSIS — M545 Low back pain, unspecified: Secondary | ICD-10-CM | POA: Diagnosis not present

## 2013-04-17 DIAGNOSIS — I739 Peripheral vascular disease, unspecified: Secondary | ICD-10-CM | POA: Diagnosis not present

## 2013-04-17 DIAGNOSIS — E785 Hyperlipidemia, unspecified: Secondary | ICD-10-CM | POA: Diagnosis not present

## 2013-04-17 DIAGNOSIS — E109 Type 1 diabetes mellitus without complications: Secondary | ICD-10-CM | POA: Diagnosis not present

## 2013-04-23 ENCOUNTER — Ambulatory Visit: Payer: Medicare Other | Admitting: Neurosurgery

## 2013-04-23 ENCOUNTER — Other Ambulatory Visit (INDEPENDENT_AMBULATORY_CARE_PROVIDER_SITE_OTHER): Payer: Medicare Other | Admitting: *Deleted

## 2013-04-23 DIAGNOSIS — I6529 Occlusion and stenosis of unspecified carotid artery: Secondary | ICD-10-CM

## 2013-04-26 DIAGNOSIS — M79609 Pain in unspecified limb: Secondary | ICD-10-CM | POA: Diagnosis not present

## 2013-04-26 DIAGNOSIS — M25579 Pain in unspecified ankle and joints of unspecified foot: Secondary | ICD-10-CM | POA: Diagnosis not present

## 2013-04-26 DIAGNOSIS — M779 Enthesopathy, unspecified: Secondary | ICD-10-CM | POA: Diagnosis not present

## 2013-04-26 DIAGNOSIS — L97509 Non-pressure chronic ulcer of other part of unspecified foot with unspecified severity: Secondary | ICD-10-CM | POA: Diagnosis not present

## 2013-04-30 DIAGNOSIS — E291 Testicular hypofunction: Secondary | ICD-10-CM | POA: Diagnosis not present

## 2013-05-03 DIAGNOSIS — L97509 Non-pressure chronic ulcer of other part of unspecified foot with unspecified severity: Secondary | ICD-10-CM | POA: Diagnosis not present

## 2013-05-07 ENCOUNTER — Other Ambulatory Visit: Payer: Self-pay | Admitting: *Deleted

## 2013-05-08 ENCOUNTER — Encounter: Payer: Self-pay | Admitting: Surgery

## 2013-05-08 DIAGNOSIS — E291 Testicular hypofunction: Secondary | ICD-10-CM | POA: Diagnosis not present

## 2013-05-16 DIAGNOSIS — E291 Testicular hypofunction: Secondary | ICD-10-CM | POA: Diagnosis not present

## 2013-05-23 DIAGNOSIS — E291 Testicular hypofunction: Secondary | ICD-10-CM | POA: Diagnosis not present

## 2013-05-30 DIAGNOSIS — E291 Testicular hypofunction: Secondary | ICD-10-CM | POA: Diagnosis not present

## 2013-06-07 DIAGNOSIS — Z23 Encounter for immunization: Secondary | ICD-10-CM | POA: Diagnosis not present

## 2013-06-07 DIAGNOSIS — L8990 Pressure ulcer of unspecified site, unspecified stage: Secondary | ICD-10-CM | POA: Diagnosis not present

## 2013-06-07 DIAGNOSIS — G2589 Other specified extrapyramidal and movement disorders: Secondary | ICD-10-CM | POA: Diagnosis not present

## 2013-06-07 DIAGNOSIS — E109 Type 1 diabetes mellitus without complications: Secondary | ICD-10-CM | POA: Diagnosis not present

## 2013-06-13 DIAGNOSIS — E291 Testicular hypofunction: Secondary | ICD-10-CM | POA: Diagnosis not present

## 2013-06-14 DIAGNOSIS — L97509 Non-pressure chronic ulcer of other part of unspecified foot with unspecified severity: Secondary | ICD-10-CM | POA: Diagnosis not present

## 2013-06-21 DIAGNOSIS — M79609 Pain in unspecified limb: Secondary | ICD-10-CM | POA: Diagnosis not present

## 2013-06-21 DIAGNOSIS — L97509 Non-pressure chronic ulcer of other part of unspecified foot with unspecified severity: Secondary | ICD-10-CM | POA: Diagnosis not present

## 2013-06-28 DIAGNOSIS — L97509 Non-pressure chronic ulcer of other part of unspecified foot with unspecified severity: Secondary | ICD-10-CM | POA: Diagnosis not present

## 2013-07-04 DIAGNOSIS — E291 Testicular hypofunction: Secondary | ICD-10-CM | POA: Diagnosis not present

## 2013-07-05 DIAGNOSIS — L97509 Non-pressure chronic ulcer of other part of unspecified foot with unspecified severity: Secondary | ICD-10-CM | POA: Diagnosis not present

## 2013-07-12 DIAGNOSIS — L97509 Non-pressure chronic ulcer of other part of unspecified foot with unspecified severity: Secondary | ICD-10-CM | POA: Diagnosis not present

## 2013-07-16 ENCOUNTER — Encounter (HOSPITAL_COMMUNITY): Payer: Self-pay | Admitting: *Deleted

## 2013-07-16 ENCOUNTER — Ambulatory Visit (INDEPENDENT_AMBULATORY_CARE_PROVIDER_SITE_OTHER): Payer: Medicare Other | Admitting: Cardiovascular Disease

## 2013-07-16 ENCOUNTER — Encounter: Payer: Self-pay | Admitting: Cardiovascular Disease

## 2013-07-16 VITALS — BP 122/78 | HR 84 | Ht 72.0 in | Wt 252.0 lb

## 2013-07-16 DIAGNOSIS — I10 Essential (primary) hypertension: Secondary | ICD-10-CM | POA: Diagnosis not present

## 2013-07-16 DIAGNOSIS — I739 Peripheral vascular disease, unspecified: Secondary | ICD-10-CM

## 2013-07-16 DIAGNOSIS — E785 Hyperlipidemia, unspecified: Secondary | ICD-10-CM | POA: Diagnosis not present

## 2013-07-16 NOTE — Progress Notes (Signed)
07/16/2013 [name]   28-Feb-1946  [id number]  Primary Physician Fredirick Maudlin, MD Primary Cardiologist: Runell Gess MD [name]   HPI:  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 He now works at [employer]. He was referred by Dr. Karren Burly from [facility] for peripheral vasodilation because of nonhealing small ulcers on the dorsal surface of his great toes bilaterally. His cardiovascular risk factor profile is remarkable for treated hypertension, diabetes and hyperlipidemia. He has had a stroke 03/08/12 and has carotid disease followed by Dr. Myra Gianotti ,. He denies chest pain, shortness of breath or claudication. He has had 2 small ulcers on the dorsal surface of both great toes for 6-9 months which have been slow to heal. Dr. Elvin So was anticipating doing a skin graft I wanted vascular dilation to assess likelihood of healing.   Current Outpatient Prescriptions  Medication Sig Dispense Refill  . aspirin EC 81 MG tablet Take 81 mg by mouth daily.      . clopidogrel (PLAVIX) 75 MG tablet       . Dapagliflozin Propanediol (FARXIGA) 5 MG TABS Take 5 mg by mouth daily.      [name] gabapentin (NEURONTIN) 600 MG tablet Take 600 mg by mouth 3 (three) times daily.      [name] glimepiride (AMARYL) 4 MG tablet Take 4 mg by mouth daily before breakfast.      . levothyroxine (SYNTHROID, LEVOTHROID) 25 MCG tablet       . losartan-hydrochlorothiazide (HYZAAR) 100-12.5 MG per tablet Take 1 tablet by mouth daily.      . mupirocin ointment (BACTROBAN) 2 %       . pravastatin (PRAVACHOL) 40 MG tablet daily.      . SitaGLIPtin Phosphate (JANUVIA PO) Take by mouth daily.      [name] testosterone cypionate (DEPOTESTOTERONE CYPIONATE) 200 MG/ML injection Inject 200 mg into the muscle every 7 (seven) days.       [name] atorvastatin (LIPITOR) 10 MG tablet Take 1  tablet (10 mg total) by mouth daily at 6 PM.  30 tablet  12  . pantoprazole (PROTONIX) 40 MG tablet Take 1 tablet (40 mg total) by mouth 2 (two) times daily before a meal.  60 tablet  12   No current facility-administered medications for this visit.    Allergies  Allergen Reactions  . Codeine Nausea And Vomiting  . Tramadol Nausea Only    History   Social History  . Marital Status: Married    Spouse Name: N/A    Number of Children: N/A  . Years of Education: N/A   Occupational History  . Not on file.   Social History Main Topics  . Smoking status: Never Smoker   . Smokeless tobacco: Never Used  . Alcohol Use: No  . Drug Use: No  . Sexual Activity: Not on file   Other Topics Concern  . Not on file   Social History Narrative  . No narrative on file     Review of Systems: General: negative for chills, fever, night sweats or weight changes.  Cardiovascular: negative for chest pain, dyspnea on exertion, edema, orthopnea, palpitations, paroxysmal nocturnal dyspnea or shortness of breath Dermatological: negative for rash Respiratory: negative for cough or wheezing Urologic: negative for hematuria Abdominal: negative for nausea, vomiting, diarrhea, bright red blood per rectum, melena, or hematemesis Neurologic: negative for visual changes,  syncope, or dizziness All other systems reviewed and are otherwise negative except as noted above.    Blood pressure 122/78, pulse 84, height 6' (1.829 m), weight 252 lb (114.306 kg).  General appearance: alert and no distress Neck: no adenopathy, no carotid bruit, no JVD, supple, symmetrical, trachea midline and thyroid not enlarged, symmetric, no tenderness/mass/nodules Lungs: clear to auscultation bilaterally Heart: regular rate and rhythm, S1, S2 normal, no murmur, click, rub or gallop Abdomen: soft, non-tender; bowel sounds normal; no masses,  no organomegaly Extremities: extremities normal, atraumatic, no cyanosis or edema and  absent pedal pulses Pulses: 2+ and symmetric absent pedal pulses  EKG not performed today  ASSESSMENT AND PLAN:   Peripheral arterial disease Patient was [facility] for peripheral vasodilation because of slowly healing ulcers on both great toes. His risk factors include hypertension, hypokalemia and diabetes. He also has a known carotid artery disease. He denies claudication. He's had ulcers on both great toes for the last 6-9 months which did slow to heal. Dr. Elvin So was planning on doing a vascular graft and wished to assess circulation and potential healing.  Hyperlipidemia On a statin drug followed by PCP.  Hypertension Controlled on current medications      Runell Gess MD [facility], [facility] 07/16/2013 2:04 PM

## 2013-07-16 NOTE — Assessment & Plan Note (Signed)
Controlled on current medications 

## 2013-07-16 NOTE — Assessment & Plan Note (Signed)
On a statin drug followed by PCP.

## 2013-07-16 NOTE — Assessment & Plan Note (Signed)
Patient was referred by [facility] but sent her for peripheral vasodilation because of slowly healing ulcers on both great toes. His risk factors include hypertension, hypokalemia and diabetes. He also has a known carotid artery disease. He denies claudication. He's had ulcers on both great toes for the last 6-9 months which did slow to heal. Dr. Elvin So was planning on doing a vascular graft and wished to assess circulation and potential healing.

## 2013-07-16 NOTE — Patient Instructions (Signed)
  We will see you back in follow up after the test.   Dr Allyson Sabal has ordered lower extremity arterial dopplers.

## 2013-07-18 DIAGNOSIS — E291 Testicular hypofunction: Secondary | ICD-10-CM | POA: Diagnosis not present

## 2013-07-20 ENCOUNTER — Ambulatory Visit (HOSPITAL_COMMUNITY)
Admission: RE | Admit: 2013-07-20 | Discharge: 2013-07-20 | Disposition: A | Discharge status: Home / Self Care | Payer: Medicare Other | Source: Ambulatory Visit | Attending: Cardiovascular Disease | Admitting: Cardiovascular Disease

## 2013-07-20 DIAGNOSIS — L97509 Non-pressure chronic ulcer of other part of unspecified foot with unspecified severity: Secondary | ICD-10-CM | POA: Diagnosis not present

## 2013-07-20 DIAGNOSIS — I739 Peripheral vascular disease, unspecified: Secondary | ICD-10-CM | POA: Insufficient documentation

## 2013-07-20 NOTE — Progress Notes (Signed)
Arterial Duplex Lower Ext. Completed. Samnang Shugars, BS, RDMS, RVT  

## 2013-07-25 ENCOUNTER — Encounter: Payer: Self-pay | Admitting: Cardiovascular Disease

## 2013-07-25 ENCOUNTER — Ambulatory Visit (INDEPENDENT_AMBULATORY_CARE_PROVIDER_SITE_OTHER): Payer: Medicare Other | Admitting: Cardiovascular Disease

## 2013-07-25 VITALS — BP 102/62 | HR 84 | Ht 72.0 in | Wt 247.3 lb

## 2013-07-25 DIAGNOSIS — R0609 Other forms of dyspnea: Secondary | ICD-10-CM

## 2013-07-25 DIAGNOSIS — R0683 Snoring: Secondary | ICD-10-CM

## 2013-07-25 DIAGNOSIS — R0989 Other specified symptoms and signs involving the circulatory and respiratory systems: Secondary | ICD-10-CM

## 2013-07-25 NOTE — Patient Instructions (Addendum)
Your physician wants you to follow-up in: 3 months with an extender and 6 months with Dr Allyson Sabal. You will receive a reminder letter in the mail two months in advance. If you don't receive a letter, please call our office to schedule the follow-up appointment.   Dr Allyson Sabal has ordered a sleep study.

## 2013-07-25 NOTE — Assessment & Plan Note (Signed)
I performed a lower extreme the arterial Dopplers on him 11/31/14 revealing patent iliac and SFA is. He had typical tibial disease with diabetic with patent peroneal arteries bilaterally occluded DP and PTs. At this point his ulcers are small and apparently are healing slowly. I do not think he needs an endovascular procedure to open up his anterior tibial however should the ulcers become infected and/or progress he might be a candidate for this.

## 2013-07-25 NOTE — Progress Notes (Signed)
07/25/2013 ORAN DILLENBURG   1946/01/30  [id number]  Primary Physician Fredirick Maudlin, MD Primary Cardiologist: Runell Gess MD [name]   HPI:  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 He was referred by Dr. Karren Burly from [facility] for peripheral vasodilation because of nonhealing small ulcers on the dorsal surface of his great toes bilaterally. His cardiovascular risk factor profile is remarkable for treated hypertension, diabetes and hyperlipidemia. He has had a stroke 03/08/12 and has carotid disease followed by Dr. Myra Gianotti ,. He denies chest pain, shortness of breath or claudication. He has had 2 small ulcers on the dorsal surface of both great toes for 6-9 months which have been slow to heal. Dr. Elvin So was anticipating doing a skin graft I wanted vascular ultrasound to assess likelihood of healing.    Current Outpatient Prescriptions  Medication Sig Dispense Refill  . aspirin EC 81 MG tablet Take 81 mg by mouth daily.      [name] atorvastatin (LIPITOR) 10 MG tablet Take 10 mg by mouth daily at 6 PM.      . clopidogrel (PLAVIX) 75 MG tablet Take 75 mg by mouth daily.       . Dapagliflozin Propanediol (FARXIGA) 5 MG TABS Take 5 mg by mouth daily.      [name] gabapentin (NEURONTIN) 600 MG tablet Take 600 mg by mouth 4 (four) times daily.       [name] glimepiride (AMARYL) 4 MG tablet Take 4 mg by mouth daily before breakfast.      . levothyroxine (SYNTHROID, LEVOTHROID) 25 MCG tablet Take 25 mcg by mouth daily.       [name] losartan-hydrochlorothiazide (HYZAAR) 100-12.5 MG per tablet Take 1 tablet by mouth daily.      . Pantoprazole Sodium (PROTONIX PO) Take 40 mg by mouth 2 (two) times daily.      . pravastatin (PRAVACHOL) 40 MG tablet daily.      . SitaGLIPtin Phosphate (JANUVIA PO) Take by mouth daily.      [name]  testosterone cypionate (DEPOTESTOTERONE CYPIONATE) 200 MG/ML injection Inject 200 mg into the muscle every 7 (seven) days.        No current facility-administered medications for this visit.    Allergies  Allergen Reactions  . Codeine Nausea And Vomiting  . Tramadol Nausea Only    History   Social History  . Marital Status: Married    Spouse Name: N/A    Number of Children: N/A  . Years of Education: N/A   Occupational History  . Not on file.   Social History Main Topics  . Smoking status: Former Smoker -- 1 years    Types: Pipe  . Smokeless tobacco: Never Used  . Alcohol Use: No  . Drug Use: No  . Sexual Activity: Not on file   Other Topics Concern  . Not on file   Social History Narrative  . No narrative on file     Review of Systems: General: negative for chills, fever, night sweats or weight changes.  Cardiovascular: negative for chest pain, dyspnea on exertion, edema, orthopnea, palpitations, paroxysmal nocturnal dyspnea or shortness of breath Dermatological: negative for rash Respiratory: negative for cough or wheezing Urologic: negative for hematuria Abdominal: negative for nausea, vomiting, diarrhea, bright red blood per rectum, melena, or hematemesis Neurologic: negative for visual changes, syncope, or dizziness  All other systems reviewed and are otherwise negative except as noted above.    Blood pressure 102/62, pulse 84, height 6' (1.829 m), weight 247 lb 4.8 oz (112.175 kg).  General appearance: alert and no distress Neck: no adenopathy, no carotid bruit, no JVD, supple, symmetrical, trachea midline and thyroid not enlarged, symmetric, no tenderness/mass/nodules Lungs: clear to auscultation bilaterally Heart: regular rate and rhythm, S1, S2 normal, no murmur, click, rub or gallop Extremities: extremities normal, atraumatic, no cyanosis or edema and 2 small ulcers on the dorsal surface of both great toes  EKG not performed today  ASSESSMENT AND  PLAN:   Peripheral arterial disease I performed a lower extreme the arterial Dopplers on him 11/31/14 revealing patent iliac and SFA is. He had typical tibial disease with diabetic with patent peroneal arteries bilaterally occluded DP and PTs. At this point his ulcers are small and apparently are healing slowly. I do not think he needs an endovascular procedure to open up his anterior tibial however should the ulcers become infected and/or progress he might be a candidate for this.      Runell Gess MD FACP,FACC,FAHA, [facility] 07/25/2013 3:09 PM

## 2013-07-26 DIAGNOSIS — L97509 Non-pressure chronic ulcer of other part of unspecified foot with unspecified severity: Secondary | ICD-10-CM | POA: Diagnosis not present

## 2013-07-31 DIAGNOSIS — E291 Testicular hypofunction: Secondary | ICD-10-CM | POA: Diagnosis not present

## 2013-07-31 DIAGNOSIS — Z79899 Other long term (current) drug therapy: Secondary | ICD-10-CM | POA: Diagnosis not present

## 2013-07-31 DIAGNOSIS — IMO0001 Reserved for inherently not codable concepts without codable children: Secondary | ICD-10-CM | POA: Diagnosis not present

## 2013-08-02 DIAGNOSIS — L97509 Non-pressure chronic ulcer of other part of unspecified foot with unspecified severity: Secondary | ICD-10-CM | POA: Diagnosis not present

## 2013-08-07 DIAGNOSIS — R0609 Other forms of dyspnea: Secondary | ICD-10-CM | POA: Diagnosis not present

## 2013-08-07 DIAGNOSIS — R0989 Other specified symptoms and signs involving the circulatory and respiratory systems: Secondary | ICD-10-CM | POA: Diagnosis not present

## 2013-08-09 DIAGNOSIS — L97509 Non-pressure chronic ulcer of other part of unspecified foot with unspecified severity: Secondary | ICD-10-CM | POA: Diagnosis not present

## 2013-08-14 DIAGNOSIS — I739 Peripheral vascular disease, unspecified: Secondary | ICD-10-CM | POA: Diagnosis not present

## 2013-08-14 DIAGNOSIS — L84 Corns and callosities: Secondary | ICD-10-CM | POA: Diagnosis not present

## 2013-08-14 DIAGNOSIS — R269 Unspecified abnormalities of gait and mobility: Secondary | ICD-10-CM | POA: Diagnosis not present

## 2013-08-14 DIAGNOSIS — L608 Other nail disorders: Secondary | ICD-10-CM | POA: Diagnosis not present

## 2013-08-14 DIAGNOSIS — E291 Testicular hypofunction: Secondary | ICD-10-CM | POA: Diagnosis not present

## 2013-08-14 DIAGNOSIS — E1159 Type 2 diabetes mellitus with other circulatory complications: Secondary | ICD-10-CM | POA: Diagnosis not present

## 2013-08-23 DIAGNOSIS — L97509 Non-pressure chronic ulcer of other part of unspecified foot with unspecified severity: Secondary | ICD-10-CM | POA: Diagnosis not present

## 2013-08-29 DIAGNOSIS — E291 Testicular hypofunction: Secondary | ICD-10-CM | POA: Diagnosis not present

## 2013-09-03 ENCOUNTER — Other Ambulatory Visit: Payer: Self-pay | Admitting: Surgery

## 2013-09-03 DIAGNOSIS — Z8673 Personal history of transient ischemic attack (TIA), and cerebral infarction without residual deficits: Secondary | ICD-10-CM

## 2013-09-04 DIAGNOSIS — E291 Testicular hypofunction: Secondary | ICD-10-CM | POA: Diagnosis not present

## 2013-09-05 ENCOUNTER — Encounter: Payer: Self-pay | Admitting: Cardiovascular Disease

## 2013-09-06 DIAGNOSIS — L97509 Non-pressure chronic ulcer of other part of unspecified foot with unspecified severity: Secondary | ICD-10-CM | POA: Diagnosis not present

## 2013-09-24 DIAGNOSIS — E291 Testicular hypofunction: Secondary | ICD-10-CM | POA: Diagnosis not present

## 2013-09-28 DIAGNOSIS — E1159 Type 2 diabetes mellitus with other circulatory complications: Secondary | ICD-10-CM | POA: Diagnosis not present

## 2013-09-28 DIAGNOSIS — L84 Corns and callosities: Secondary | ICD-10-CM | POA: Diagnosis not present

## 2013-09-28 DIAGNOSIS — L97509 Non-pressure chronic ulcer of other part of unspecified foot with unspecified severity: Secondary | ICD-10-CM | POA: Diagnosis not present

## 2013-09-28 DIAGNOSIS — I739 Peripheral vascular disease, unspecified: Secondary | ICD-10-CM | POA: Diagnosis not present

## 2013-10-11 DIAGNOSIS — E119 Type 2 diabetes mellitus without complications: Secondary | ICD-10-CM | POA: Diagnosis not present

## 2013-10-11 DIAGNOSIS — Z961 Presence of intraocular lens: Secondary | ICD-10-CM | POA: Diagnosis not present

## 2013-10-12 DIAGNOSIS — L97509 Non-pressure chronic ulcer of other part of unspecified foot with unspecified severity: Secondary | ICD-10-CM | POA: Diagnosis not present

## 2013-10-12 DIAGNOSIS — M79609 Pain in unspecified limb: Secondary | ICD-10-CM | POA: Diagnosis not present

## 2013-10-12 DIAGNOSIS — M25579 Pain in unspecified ankle and joints of unspecified foot: Secondary | ICD-10-CM | POA: Diagnosis not present

## 2013-10-17 DIAGNOSIS — E291 Testicular hypofunction: Secondary | ICD-10-CM | POA: Diagnosis not present

## 2013-10-18 DIAGNOSIS — L97509 Non-pressure chronic ulcer of other part of unspecified foot with unspecified severity: Secondary | ICD-10-CM | POA: Diagnosis not present

## 2013-10-23 ENCOUNTER — Ambulatory Visit (INDEPENDENT_AMBULATORY_CARE_PROVIDER_SITE_OTHER): Payer: Medicare Other | Admitting: Cardiology

## 2013-10-23 ENCOUNTER — Encounter: Payer: Self-pay | Admitting: Cardiology

## 2013-10-23 VITALS — BP 118/78 | HR 74 | Ht 72.0 in | Wt 251.1 lb

## 2013-10-23 DIAGNOSIS — E663 Overweight: Secondary | ICD-10-CM | POA: Insufficient documentation

## 2013-10-23 DIAGNOSIS — E1169 Type 2 diabetes mellitus with other specified complication: Secondary | ICD-10-CM

## 2013-10-23 DIAGNOSIS — E669 Obesity, unspecified: Secondary | ICD-10-CM

## 2013-10-23 DIAGNOSIS — I6330 Cerebral infarction due to thrombosis of unspecified cerebral artery: Secondary | ICD-10-CM | POA: Diagnosis not present

## 2013-10-23 DIAGNOSIS — G4730 Sleep apnea, unspecified: Secondary | ICD-10-CM | POA: Diagnosis not present

## 2013-10-23 DIAGNOSIS — G609 Hereditary and idiopathic neuropathy, unspecified: Secondary | ICD-10-CM

## 2013-10-23 DIAGNOSIS — G2581 Restless legs syndrome: Secondary | ICD-10-CM | POA: Diagnosis not present

## 2013-10-23 DIAGNOSIS — I10 Essential (primary) hypertension: Secondary | ICD-10-CM

## 2013-10-23 DIAGNOSIS — E119 Type 2 diabetes mellitus without complications: Secondary | ICD-10-CM

## 2013-10-23 DIAGNOSIS — I429 Cardiomyopathy, unspecified: Secondary | ICD-10-CM

## 2013-10-23 DIAGNOSIS — I639 Cerebral infarction, unspecified: Secondary | ICD-10-CM

## 2013-10-23 DIAGNOSIS — I428 Other cardiomyopathies: Secondary | ICD-10-CM

## 2013-10-23 DIAGNOSIS — Z6829 Body mass index (BMI) 29.0-29.9, adult: Secondary | ICD-10-CM | POA: Insufficient documentation

## 2013-10-23 DIAGNOSIS — I739 Peripheral vascular disease, unspecified: Secondary | ICD-10-CM

## 2013-10-23 DIAGNOSIS — G629 Polyneuropathy, unspecified: Secondary | ICD-10-CM

## 2013-10-23 DIAGNOSIS — E785 Hyperlipidemia, unspecified: Secondary | ICD-10-CM

## 2013-10-23 MED ORDER — ROPINIROLE HCL 0.5 MG PO TABS: 0.5000 mg | ORAL_TABLET | Freq: Three times a day (TID) | ORAL | Status: DC

## 2013-10-23 MED ORDER — ROPINIROLE HCL 0.25 MG PO TABS: 0.2500 mg | ORAL_TABLET | Freq: Every day | ORAL | Status: DC

## 2013-10-23 NOTE — Assessment & Plan Note (Signed)
Carotid and LE disease, followed by Dr [name] and [facility]

## 2013-10-23 NOTE — Assessment & Plan Note (Signed)
I added Requip

## 2013-10-23 NOTE — Progress Notes (Signed)
10/23/2013 [name]   02/08/1946  [id number]  Primary Physicia Christopher L, MD Primary Cardiologist: Dr Gwenlyn Found   HPI:  [name] is a 68 year old moderately overweight married Caucasian male father of 78, grandfather 45 grandchildren. 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 He was referred to Dr Gwenlyn Found by Dr. [name] from [facility] for peripheral vascular disease because of nonhealing small ulcers on the dorsal surface of his great toes bilaterally. His cardiovascular risk factor profile is remarkable for treated hypertension, diabetes and hyperlipidemia. He has had a Rt brain embolic stroke 7/32/20. He has carotid disease followed by Dr. [name].           He is here for his "quarterly check up". He had an ABI of .93 on the Rt with evidence of tibial disease. They were not able to get an ABI on the Rt secondary to restless leg and movement.  He continues to follow up with the [facility]. He seems to be stable from that standpoint, though he recently had a setback on his Lt great toe that had to be "trimmed up". He had a sleep study 3 months ago and was supposed to get a titration study but this never got scheduled.     Current Outpatient Prescriptions  Medication Sig Dispense Refill  . aspirin EC 81 MG tablet Take 81 mg by mouth daily.      . clopidogrel (PLAVIX) 75 MG tablet Take 75 mg by mouth daily.       [name] gabapentin (NEURONTIN) 600 MG tablet Take 600 mg by mouth 4 (four) times daily.       [name] glimepiride (AMARYL) 4 MG tablet Take 4 mg by mouth daily before breakfast.      . levothyroxine (SYNTHROID, LEVOTHROID) 25 MCG tablet Take 25 mcg by mouth daily.       [name] losartan-hydrochlorothiazide (HYZAAR) 100-12.5 MG per tablet Take 1 tablet by mouth daily.      . Pantoprazole Sodium (PROTONIX PO) Take 40 mg by mouth 2 (two) times daily.      . pravastatin (PRAVACHOL) 40 MG tablet daily.      [name] testosterone cypionate  (DEPOTESTOTERONE CYPIONATE) 200 MG/ML injection Inject 200 mg into the muscle every 7 (seven) days.       [name] rOPINIRole (REQUIP) 0.25 MG tablet Take 1 tablet (0.25 mg total) by mouth daily.  7 tablet  0  . [START ON 10/29/2013] rOPINIRole (REQUIP) 0.5 MG tablet Take 1 tablet (0.5 mg total) by mouth 3 (three) times daily.  30 tablet  11   No current facility-administered medications for this visit.    Allergies  Allergen Reactions  . Codeine Nausea And Vomiting  . Tramadol Nausea Only    History   Social History  . Marital Status: Married    Spouse Name: N/A    Number of Children: N/A  . Years of Education: N/A   Occupational History  . Not on file.   Social History Main Topics  . Smoking status: Former Smoker -- 1 years    Types: Pipe  . Smokeless tobacco: Never Used  . Alcohol Use: No  . Drug Use: No  . Sexual Activity: Not on file   Other Topics Concern  . Not on file   Social History Narrative  . No narrative on file     Review of Systems: General: negative for chills, fever, night sweats or weight changes.  Cardiovascular: negative  for chest pain, dyspnea on exertion, edema, orthopnea, palpitations, paroxysmal nocturnal dyspnea or shortness of breath Dermatological: negative for rash Respiratory: negative for cough or wheezing Urologic: negative for hematuria Abdominal: negative for nausea, vomiting, diarrhea, bright red blood per rectum, melena, or hematemesis Neurologic: negative for visual changes, syncope, or dizziness All other systems reviewed and are otherwise negative except as noted above.    Blood pressure 118/78, pulse 74, height 6' (1.829 m), weight 251 lb 1.6 oz (113.898 kg).  General appearance: alert, cooperative, no distress and moderately obese Neck: no carotid bruit and no JVD Lungs: clear to auscultation bilaterally Heart: regular rate and rhythm Extremities: He has a dressing on the Lt great toe, no obvious infection  EKG  NSR  ASSESSMENT AND PLAN:   Peripheral arterial disease Carotid and LE disease, followed by Dr [name] and [place]  Peripheral neuropathy .  Restless leg syndrome I added Requip  Hypertension Controlled  Hyperlipidemia Treated  History of stroke June 2013 June 2013  Diabetes mellitus type 2 in obese .  Cardiomyopathy- EF NL 5/14 Echo in June 2013 showed an EF 45%, echo May 2014 - 50-55%  Sleep apnea Will schedule titration study   PLAN  I ordered a sleep apnea titration study. I added Requip for his restless leg. He can follow up with Dr Gwenlyn Found in 3 months.  [name] KPA-C 10/23/2013 12:15 PM

## 2013-10-23 NOTE — Assessment & Plan Note (Signed)
Controlled.  

## 2013-10-23 NOTE — Assessment & Plan Note (Signed)
June 2013

## 2013-10-23 NOTE — Assessment & Plan Note (Signed)
Will schedule titration study

## 2013-10-23 NOTE — Assessment & Plan Note (Signed)
Treated

## 2013-10-23 NOTE — Assessment & Plan Note (Signed)
Echo in June 2013 showed an EF 45%, echo May 2014 - 50-55%

## 2013-10-23 NOTE — Patient Instructions (Signed)
Try Requip 0.25 mg daily for 7 days, then 0.5 mg daily. Take it in the evening- around 7pm Sleep study to be scheduled Dr Gwenlyn Found in 3 months

## 2013-10-24 ENCOUNTER — Ambulatory Visit: Payer: Medicare Other | Admitting: Cardiology

## 2013-10-25 DIAGNOSIS — L97509 Non-pressure chronic ulcer of other part of unspecified foot with unspecified severity: Secondary | ICD-10-CM | POA: Diagnosis not present

## 2013-10-29 ENCOUNTER — Telehealth: Payer: Self-pay | Admitting: *Deleted

## 2013-10-29 ENCOUNTER — Other Ambulatory Visit: Payer: Self-pay | Admitting: *Deleted

## 2013-10-29 MED ORDER — ROPINIROLE HCL 0.5 MG PO TABS: 0.5000 mg | ORAL_TABLET | Freq: Every day | ORAL | Status: DC

## 2013-10-29 NOTE — Telephone Encounter (Signed)
[name] at [facility] called.  Stated two scripts for Requip received: 0.25 mg daily #7 and 0.5 mg three times daily.  Stated this is usually a once daily med.  RN reviewed chart and AVS stated pt is supposed to take 0.25 mg daily x 7 days and then 0.5 mg daily at 7 pm afterwards.  [name] informed of this and VO given to change Rx.  Med list updated as well.

## 2013-10-29 NOTE — Telephone Encounter (Signed)
[name] at [facility] was calling in regards to [name] medication.

## 2013-10-29 NOTE — Telephone Encounter (Signed)
Call already handled.

## 2013-10-29 NOTE — Telephone Encounter (Signed)
See refill encounter

## 2013-10-31 DIAGNOSIS — L97509 Non-pressure chronic ulcer of other part of unspecified foot with unspecified severity: Secondary | ICD-10-CM | POA: Diagnosis not present

## 2013-11-05 DIAGNOSIS — E291 Testicular hypofunction: Secondary | ICD-10-CM | POA: Diagnosis not present

## 2013-11-09 ENCOUNTER — Encounter: Payer: Self-pay | Admitting: Surgery

## 2013-11-12 ENCOUNTER — Encounter: Payer: Self-pay | Admitting: Surgery

## 2013-11-12 ENCOUNTER — Telehealth: Payer: Self-pay | Admitting: Surgery

## 2013-11-12 ENCOUNTER — Ambulatory Visit (INDEPENDENT_AMBULATORY_CARE_PROVIDER_SITE_OTHER): Payer: Medicare Other | Admitting: Surgery

## 2013-11-12 ENCOUNTER — Ambulatory Visit (HOSPITAL_COMMUNITY)
Admission: RE | Admit: 2013-11-12 | Discharge: 2013-11-12 | Disposition: A | Discharge status: Home / Self Care | Payer: Medicare Other | Source: Ambulatory Visit | Attending: Surgery | Admitting: Surgery

## 2013-11-12 VITALS — BP 138/75 | HR 71 | Resp 18 | Ht 72.0 in | Wt 249.4 lb

## 2013-11-12 DIAGNOSIS — I658 Occlusion and stenosis of other precerebral arteries: Secondary | ICD-10-CM | POA: Diagnosis not present

## 2013-11-12 DIAGNOSIS — Z8673 Personal history of transient ischemic attack (TIA), and cerebral infarction without residual deficits: Secondary | ICD-10-CM

## 2013-11-12 DIAGNOSIS — I6529 Occlusion and stenosis of unspecified carotid artery: Secondary | ICD-10-CM

## 2013-11-12 NOTE — Telephone Encounter (Signed)
Spoke with pt, gave appt info:  [facility] 11/15/13 [phone]  Faxed 2/23 to [phone] [facility] 11/19/13  1:45 pm [facility] 11/19/13 3pm Pt wrote info down, verbalized understanding.

## 2013-11-12 NOTE — Addendum Note (Signed)
Addended by: Reola Calkins on: 11/12/2013 01:38 PM   Modules accepted: Orders

## 2013-11-12 NOTE — Progress Notes (Signed)
Patient name: [name] MRN: [id number] DOB: 06/16/1946 Sex: male     Chief Complaint  Patient presents with  . Carotid    6 month FU carotid with duplex    HISTORY OF PRESENT ILLNESS: The patient comes in today for evaluation of his carotid occlusive disease. In the end of June, 2013  he had an episode of slurred speech. He was evaluated at the emergency department and sent home. He went to work the following day and found that he was dropping stuff in particular, he had difficulty using his left arm. He was again taken back to the emergency department and was diagnosed with a stroke. During his workup a MRI revealed a right brain stroke associated with microvascular changes. He also had a carotid duplex which revealed minimal stenosis on the right and 70-90% stenosis on the left.  Her office indicated that his stenosis on the left was in the 60-79% stenosis.  Therefore he was treated medically for his right brain stroke.  He has nearly recovered without deficits from his stroke with the exception of bad handwriting.  His hypercholesterolemia is managed with a statin.  He is on double antiplatelet therapy.   Past Medical History  Diagnosis Date  . Hypertension   . Diabetes mellitus   . Carotid artery occlusion   . Stroke March 08, 2012  . Hyperlipidemia   . Peripheral arterial disease     nonhealing ulcers bilaterally on each great toe    Past Surgical History  Procedure Laterality Date  . Back surgery    . Cervical fusion    . Foot surgery    . Spine surgery      History   Social History  . Marital Status: Married    Spouse Name: N/A    Number of Children: N/A  . Years of Education: N/A   Occupational History  . Not on file.   Social History Main Topics  . Smoking status: Former Smoker -- 1 years    Types: Pipe  . Smokeless tobacco: Never Used  . Alcohol Use: No  . Drug Use: No  . Sexual Activity: Not on file   Other Topics Concern  . Not on file    Social History Narrative  . No narrative on file    Family History  Problem Relation Age of Onset  . Heart disease Mother   . Hypertension Mother   . Heart attack Mother   . Diabetes Son   . Heart disease Son   . Hypertension Son   . Hypertension Father   . Diabetes Father     Allergies as of 11/12/2013 - Review Complete 11/12/2013  Allergen Reaction Noted  . Codeine Nausea And Vomiting   . Tramadol Nausea Only 10/23/2012    Current Outpatient Prescriptions on File Prior to Visit  Medication Sig Dispense Refill  . aspirin EC 81 MG tablet Take 81 mg by mouth daily.      . clopidogrel (PLAVIX) 75 MG tablet Take 75 mg by mouth daily.       [name] gabapentin (NEURONTIN) 600 MG tablet Take 600 mg by mouth 4 (four) times daily.       [name] glimepiride (AMARYL) 4 MG tablet Take 4 mg by mouth daily before breakfast.      . levothyroxine (SYNTHROID, LEVOTHROID) 25 MCG tablet Take 25 mcg by mouth daily.       [name] losartan-hydrochlorothiazide (HYZAAR) 100-12.5 MG per tablet Take 1 tablet by  mouth daily.      . Pantoprazole Sodium (PROTONIX PO) Take 40 mg by mouth 2 (two) times daily.      . pravastatin (PRAVACHOL) 40 MG tablet daily.      [name] rOPINIRole (REQUIP) 0.5 MG tablet Take 1 tablet (0.5 mg total) by mouth at bedtime.  30 tablet  11  . testosterone cypionate (DEPOTESTOTERONE CYPIONATE) 200 MG/ML injection Inject 200 mg into the muscle every 7 (seven) days.       [name] rOPINIRole (REQUIP) 0.25 MG tablet Take 1 tablet (0.25 mg total) by mouth daily.  7 tablet  0   No current facility-administered medications on file prior to visit.     REVIEW OF SYSTEMS: Cardiovascular: No chest pain, chest pressure, palpitations, orthopnea, or dyspnea on exertion. No claudication or rest pain,  No history of DVT or phlebitis. Pulmonary: No productive cough, asthma or wheezing. Neurologic: No weakness, paresthesias, aphasia, or amaurosis. No dizziness. Hematologic: No bleeding problems or clotting  disorders. Musculoskeletal: No joint pain or joint swelling. Gastrointestinal: No blood in stool or hematemesis Genitourinary: No dysuria or hematuria. Psychiatric:: No history of major depression. Integumentary: No rashes or ulcers. Constitutional: No fever or chills.  PHYSICAL EXAMINATION:   Vital signs are BP 138/75  Pulse 71  Resp 18  Ht 6' (1.829 m)  Wt 249 lb 6.4 oz (113.127 kg)  BMI 33.82 kg/m2 General: The patient appears their stated age. HEENT:  No gross abnormalities Pulmonary:  Non labored breathing Abdomen: Soft and non-tender Musculoskeletal: There are no major deformities. Neurologic: No focal weakness or paresthesias are detected, Skin: There are no ulcer or rashes noted. Psychiatric: The patient has normal affect. Cardiovascular: There is a regular rate and rhythm without significant murmur appreciated.  No carotid bruits.   Diagnostic Studies Carotid ultrasound was ordered and reviewed.  This shows progression of the stenosis to 80-99% on the left and less than 40% on the right.  His bifurcation is noted to be high  Assessment: Asymptomatic left carotid stenosis Plan:  I discussed the ultrasound findings today with the patient.  I feel that he needs intervention on his left carotid artery.  Because his bifurcation was found to be high, I am sending him for CT angiogram to better define his anatomy. if I feel that his anatomy is surgically excess, I will proceed with carotid endarterectomy.  He has tentatively been placed on the OR schedule for Thursday, March 12.  If his anatomy is too high, he will need to be considered for carotid stenting.  I discussed the risks and benefits of surgery with the patient including the risk of stroke, nerve injury, and bleeding.  The patient will followup with me next Monday after a CT angiogram of the neck.    Eldridge Abrahams, M.D. [facility] [place] Office: [phone] Pager:  [phone]

## 2013-11-12 NOTE — Telephone Encounter (Signed)
Patient called concerned. He has sleep study scheduled 11/19/13. Wanted to put off wearing oxygen mask until after surgery. Per Zigmund Daniel, ok to wear oxygen - just need to let her know if pt is diagnosed with sleep apnea. Called wife back. Gave her this info. She will give message to patient.

## 2013-11-14 DIAGNOSIS — E291 Testicular hypofunction: Secondary | ICD-10-CM | POA: Diagnosis not present

## 2013-11-15 ENCOUNTER — Other Ambulatory Visit: Payer: Self-pay | Admitting: *Deleted

## 2013-11-16 ENCOUNTER — Other Ambulatory Visit: Payer: Self-pay | Admitting: Surgery

## 2013-11-16 ENCOUNTER — Encounter: Payer: Self-pay | Admitting: Surgery

## 2013-11-16 DIAGNOSIS — I6529 Occlusion and stenosis of unspecified carotid artery: Secondary | ICD-10-CM | POA: Diagnosis not present

## 2013-11-16 LAB — BUN: BUN: 15 mg/dL (ref 6–23)

## 2013-11-16 LAB — CREATININE, SERUM: Creat: 1.01 mg/dL (ref 0.50–1.35)

## 2013-11-19 ENCOUNTER — Encounter: Payer: Self-pay | Admitting: Surgery

## 2013-11-19 ENCOUNTER — Ambulatory Visit (INDEPENDENT_AMBULATORY_CARE_PROVIDER_SITE_OTHER): Payer: Medicare Other | Admitting: Surgery

## 2013-11-19 ENCOUNTER — Ambulatory Visit: Payer: Medicare Other | Admitting: Surgery

## 2013-11-19 ENCOUNTER — Ambulatory Visit
Admission: RE | Admit: 2013-11-19 | Discharge: 2013-11-19 | Disposition: A | Discharge status: Home / Self Care | Payer: Medicare Other | Source: Ambulatory Visit | Attending: Surgery | Admitting: Surgery

## 2013-11-19 ENCOUNTER — Encounter (INDEPENDENT_AMBULATORY_CARE_PROVIDER_SITE_OTHER): Payer: Self-pay

## 2013-11-19 VITALS — BP 141/75 | HR 85 | Ht 72.0 in | Wt 249.0 lb

## 2013-11-19 DIAGNOSIS — G4730 Sleep apnea, unspecified: Secondary | ICD-10-CM | POA: Diagnosis not present

## 2013-11-19 DIAGNOSIS — I6529 Occlusion and stenosis of unspecified carotid artery: Secondary | ICD-10-CM | POA: Diagnosis not present

## 2013-11-19 MED ORDER — IOHEXOL 350 MG/ML SOLN
100.0000 mL | Freq: Once | INTRAVENOUS | Status: AC | PRN
  Administered 2013-11-19: 100 mL via INTRAVENOUS

## 2013-11-19 NOTE — Progress Notes (Signed)
   Patient name: [name] MRN: [id number] DOB: 02/24/1946 Sex: male     Chief Complaint  Patient presents with  . Re-evaluation    1 wk f/u with CTA neck prior    HISTORY OF PRESENT ILLNESS: The patient is back today for followup of his CT angiogram that was performed to better evaluate his anatomy.  The patient has a history of a right brain stroke with residual left arm weakness.  He has minimal right carotid stenosis.  I have been following his left carotid stenosis which has recently became greater than 80%.  Ultrasound indicated that his bifurcation was high and therefore I sent him for a CT angiogram to better define his anatomy.  He is back for followup.  He endorses no new symptoms.  Past Medical History  Diagnosis Date  . Hypertension   . Diabetes mellitus   . Carotid artery occlusion   . Stroke March 08, 2012  . Hyperlipidemia   . Peripheral arterial disease     nonhealing ulcers bilaterally on each great toe    Past Surgical History  Procedure Laterality Date  . Back surgery    . Cervical fusion    . Foot surgery    . Spine surgery      History   Social History  . Marital Status: Married    Spouse Name: N/A    Number of Children: N/A  . Years of Education: N/A   Occupational History  . Not on file.   Social History Main Topics  . Smoking status: Former Smoker -- 1 years    Types: Pipe  . Smokeless tobacco: Never Used  . Alcohol Use: No  . Drug Use: No  . Sexual Activity: Not on file   Other Topics Concern  . Not on file   Social History Narrative  . No narrative on file    Family History  Problem Relation Age of Onset  . Heart disease Mother   . Hypertension Mother   . Heart attack Mother   . Diabetes Son   . Heart disease Son   . Hypertension Son   . Hypertension Father   . Diabetes Father     Allergies as of 11/19/2013 - Review Complete 11/19/2013  Allergen Reaction Noted  . Codeine Nausea And Vomiting   . Tramadol Nausea  Only 10/23/2012    Current Outpatient Prescriptions on File Prior to Visit  Medication Sig Dispense Refill  . aspirin EC 81 MG tablet Take 81 mg by mouth daily.      . clopidogrel (PLAVIX) 75 MG tablet Take 75 mg by mouth daily.       . Dapagliflozin Propanediol (FARXIGA) 5 MG TABS Take 5 mg by mouth daily.      . gabapentin (NEURONTIN) 600 MG tablet Take 600 mg by mouth 4 (four) times daily.       . glimepiride (AMARYL) 4 MG tablet Take 4 mg by mouth daily before breakfast.      . levothyroxine (SYNTHROID, LEVOTHROID) 25 MCG tablet Take 25 mcg by mouth daily.       . losartan-hydrochlorothiazide (HYZAAR) 100-12.5 MG per tablet Take 1 tablet by mouth daily.      . Pantoprazole Sodium (PROTONIX PO) Take 40 mg by mouth 2 (two) times daily.      . pravastatin (PRAVACHOL) 40 MG tablet daily.      . rOPINIRole (REQUIP) 0.25 MG tablet Take 1 tablet (0.25 mg total) by mouth daily.    7 tablet  0  . rOPINIRole (REQUIP) 0.5 MG tablet Take 1 tablet (0.5 mg total) by mouth at bedtime.  30 tablet  11  . sitaGLIPtin (JANUVIA) 100 MG tablet Take 100 mg by mouth daily.      [name] testosterone cypionate (DEPOTESTOTERONE CYPIONATE) 200 MG/ML injection Inject 200 mg into the muscle every 7 (seven) days.        No current facility-administered medications on file prior to visit.     REVIEW OF SYSTEMS: No change from prior visit  PHYSICAL EXAMINATION:   Vital signs are BP 141/75  Pulse 85  Ht 6' (1.829 m)  Wt 249 lb (112.946 kg)  BMI 33.76 kg/m2  SpO2 95% General: The patient appears their stated age. HEENT:  No gross abnormalities Pulmonary:  Non labored breathing Musculoskeletal: There are no major deformities. Neurologic: No focal weakness or paresthesias are detected, Skin: There are no ulcer or rashes noted. Psychiatric: The patient has normal affect. Cardiovascular   Diagnostic Studies I have reviewed his CT angiogram which shows a radiographic string sign within the left carotid artery.  His  bifurcation does not appear to be too high and is at the mid to lower portion of C2.  Assessment: Asymptomatic left carotid stenosis Plan: The patient is already scheduled for a left carotid endarterectomy on March 12.  I again discussed the risks and benefits of surgery including the risk of nerve injury and the risk of stroke.  All his questions were answered today.  Eldridge Abrahams, M.D. [facility] [place] Office: [phone] Pager:  [phone]

## 2013-11-21 DIAGNOSIS — G4730 Sleep apnea, unspecified: Secondary | ICD-10-CM | POA: Diagnosis not present

## 2013-11-23 ENCOUNTER — Encounter (HOSPITAL_COMMUNITY): Payer: Self-pay

## 2013-11-23 ENCOUNTER — Encounter (HOSPITAL_COMMUNITY)
Admission: RE | Admit: 2013-11-23 | Discharge: 2013-11-23 | Disposition: A | Discharge status: Home / Self Care | Payer: Medicare Other | Source: Ambulatory Visit | Attending: Surgery | Admitting: Surgery

## 2013-11-23 ENCOUNTER — Other Ambulatory Visit (HOSPITAL_COMMUNITY): Payer: Medicare Other

## 2013-11-23 DIAGNOSIS — Z01818 Encounter for other preprocedural examination: Secondary | ICD-10-CM | POA: Insufficient documentation

## 2013-11-23 DIAGNOSIS — Z01812 Encounter for preprocedural laboratory examination: Secondary | ICD-10-CM | POA: Insufficient documentation

## 2013-11-23 HISTORY — DX: Hypothyroidism, unspecified: E03.9

## 2013-11-23 HISTORY — DX: Shortness of breath: R06.02

## 2013-11-23 HISTORY — DX: Unspecified osteoarthritis, unspecified site: M19.90

## 2013-11-23 HISTORY — DX: Other specified postprocedural states: R11.2

## 2013-11-23 HISTORY — DX: Other specified postprocedural states: Z98.890

## 2013-11-23 LAB — PROTIME-INR
INR: 1.07 (ref 0.00–1.49)
Prothrombin Time: 13.7 seconds (ref 11.6–15.2)

## 2013-11-23 LAB — COMPREHENSIVE METABOLIC PANEL
ALT: 15 U/L (ref 0–53)
AST: 16 U/L (ref 0–37)
Albumin: 4 g/dL (ref 3.5–5.2)
Alkaline Phosphatase: 71 U/L (ref 39–117)
BUN: 14 mg/dL (ref 6–23)
CO2: 28 mEq/L (ref 19–32)
Calcium: 9.2 mg/dL (ref 8.4–10.5)
Chloride: 100 mEq/L (ref 96–112)
Creatinine, Ser: 1 mg/dL (ref 0.50–1.35)
GFR calc Af Amer: 86 mL/min — ABNORMAL LOW (ref >90)
GFR calc non Af Amer: 74 mL/min — ABNORMAL LOW (ref >90)
Glucose, Bld: 143 mg/dL — ABNORMAL HIGH (ref 70–99)
Potassium: 3.9 mEq/L (ref 3.7–5.3)
Sodium: 140 mEq/L (ref 137–147)
Total Bilirubin: 0.5 mg/dL (ref 0.3–1.2)
Total Protein: 7.1 g/dL (ref 6.0–8.3)

## 2013-11-23 LAB — CBC
HCT: 43.6 % (ref 39.0–52.0)
Hemoglobin: 15.6 g/dL (ref 13.0–17.0)
MCH: 33.3 pg (ref 26.0–34.0)
MCHC: 35.8 g/dL (ref 30.0–36.0)
MCV: 93.2 fL (ref 78.0–100.0)
Platelets: 241 10*3/uL (ref 150–400)
RBC: 4.68 MIL/uL (ref 4.22–5.81)
RDW: 13.1 % (ref 11.5–15.5)
WBC: 8.7 10*3/uL (ref 4.0–10.5)

## 2013-11-23 LAB — URINALYSIS, ROUTINE W REFLEX MICROSCOPIC
Bilirubin Urine: NEGATIVE
Glucose, UA: >1000 mg/dL — AB
Hgb urine dipstick: NEGATIVE
Ketones, ur: 15 mg/dL — AB
Leukocytes, UA: NEGATIVE
Nitrite: NEGATIVE
Protein, ur: NEGATIVE mg/dL
Specific Gravity, Urine: 1.04 — ABNORMAL HIGH (ref 1.005–1.030)
Urobilinogen, UA: 0.2 mg/dL (ref 0.0–1.0)
pH: 5 (ref 5.0–8.0)

## 2013-11-23 LAB — TYPE AND SCREEN
ABO/RH(D): A POS
Antibody Screen: NEGATIVE

## 2013-11-23 LAB — URINE MICROSCOPIC-ADD ON

## 2013-11-23 LAB — SURGICAL PCR SCREEN
MRSA, PCR: NEGATIVE
Staphylococcus aureus: NEGATIVE

## 2013-11-23 LAB — APTT: aPTT: 29 seconds (ref 24–37)

## 2013-11-23 MED ORDER — CHLORHEXIDINE GLUCONATE 4 % EX LIQD: 60.0000 mL | Freq: Once | CUTANEOUS | Status: DC

## 2013-11-23 NOTE — Progress Notes (Signed)
Pt had recent sleep study. Has not received cpap machine yet.

## 2013-11-23 NOTE — Pre-Procedure Instructions (Signed)
JATINDER MCDONAGH  11/23/2013   Your procedure is scheduled on:  11/29/13  Report to [place]  2 * 3 at 730 AM.  Call this number if you have problems the morning of surgery: [phone]   Remember:   Do not eat food or drink liquids after midnight.   Take these medicines the morning of surgery with A SIP OF WATER: neuronitn,synthroid,protonix,requip   Do not wear jewelry, make-up or nail polish.  Do not wear lotions, powders, or perfumes. You may wear deodorant.  Do not shave 48 hours prior to surgery. Men may shave face and neck.  Do not bring valuables to the hospital.  [facility] is not responsible                  for any belongings or valuables.               Contacts, dentures or bridgework may not be worn into surgery.  Leave suitcase in the car. After surgery it may be brought to your room.  For patients admitted to the hospital, discharge time is determined by your                treatment team.               Patients discharged the day of surgery will not be allowed to drive  home.  Name and phone number of your driver: family  Special Instructions: Shower using CHG 2 nights before surgery and the night before surgery.  If you shower the day of surgery use CHG.  Use special wash - you have one bottle of CHG for all showers.  You should use approximately 1/3 of the bottle for each shower.   Please read over the following fact sheets that you were given: Pain Booklet, Coughing and Deep Breathing, Blood Transfusion Information, MRSA Information and Surgical Site Infection Prevention

## 2013-11-23 NOTE — Pre-Procedure Instructions (Signed)
[name]  11/23/2013   Your procedure is scheduled on:  11/29/13  Report to [place]  2 * 3 at 730 AM.  Call this number if you have problems the morning of surgery: [phone]   Remember:   Do not eat food or drink liquids after midnight.   Take these medicines the morning of surgery with A SIP OF WATER: neurontin,synthroid,protonix,requip   Do not wear jewelry, make-up or nail polish.  Do not wear lotions, powders, or perfumes. You may wear deodorant.  Do not shave 48 hours prior to surgery. Men may shave face and neck.  Do not bring valuables to the hospital.  [facility] is not responsible                  for any belongings or valuables.               Contacts, dentures or bridgework may not be worn into surgery.  Leave suitcase in the car. After surgery it may be brought to your room.  For patients admitted to the hospital, discharge time is determined by your                treatment team.               Patients discharged the day of surgery will not be allowed to drive  home.  Name and phone number of your driver: family  Special Instructions: Shower using CHG 2 nights before surgery and the night before surgery.  If you shower the day of surgery use CHG.  Use special wash - you have one bottle of CHG for all showers.  You should use approximately 1/3 of the bottle for each shower.   Please read over the following fact sheets that you were given: Pain Booklet, Coughing and Deep Breathing, Blood Transfusion Information, MRSA Information and Surgical Site Infection Prevention

## 2013-11-27 DIAGNOSIS — E291 Testicular hypofunction: Secondary | ICD-10-CM | POA: Diagnosis not present

## 2013-11-28 MED ORDER — DEXTROSE 5 % IV SOLN
1.5000 g | INTRAVENOUS | Status: AC
  Administered 2013-11-29: 1.5 g via INTRAVENOUS
  Filled 2013-11-28: qty 1.5

## 2013-11-28 NOTE — Progress Notes (Signed)
Pt notified of time change;to arrive at 0830 

## 2013-11-29 ENCOUNTER — Encounter (HOSPITAL_COMMUNITY): Payer: Self-pay | Admitting: Certified Registered Nurse Anesthetist

## 2013-11-29 ENCOUNTER — Inpatient Hospital Stay (HOSPITAL_COMMUNITY)
Admission: RE | Admit: 2013-11-29 | Discharge: 2013-11-30 | DRG: 038 | Disposition: A | Discharge status: Home / Self Care | Payer: Medicare Other | Source: Ambulatory Visit | Attending: Surgery | Admitting: Surgery

## 2013-11-29 ENCOUNTER — Encounter (HOSPITAL_COMMUNITY): Payer: Medicare Other | Admitting: Certified Registered Nurse Anesthetist

## 2013-11-29 ENCOUNTER — Encounter (HOSPITAL_COMMUNITY)
Admission: RE | Disposition: A | Discharge status: Home / Self Care | Payer: Self-pay | Source: Ambulatory Visit | Attending: Surgery

## 2013-11-29 ENCOUNTER — Inpatient Hospital Stay (HOSPITAL_COMMUNITY): Payer: Medicare Other | Admitting: Certified Registered Nurse Anesthetist

## 2013-11-29 DIAGNOSIS — G609 Hereditary and idiopathic neuropathy, unspecified: Secondary | ICD-10-CM | POA: Diagnosis present

## 2013-11-29 DIAGNOSIS — M129 Arthropathy, unspecified: Secondary | ICD-10-CM | POA: Diagnosis present

## 2013-11-29 DIAGNOSIS — G2581 Restless legs syndrome: Secondary | ICD-10-CM | POA: Diagnosis present

## 2013-11-29 DIAGNOSIS — R29898 Other symptoms and signs involving the musculoskeletal system: Secondary | ICD-10-CM | POA: Diagnosis present

## 2013-11-29 DIAGNOSIS — I63239 Cerebral infarction due to unspecified occlusion or stenosis of unspecified carotid arteries: Secondary | ICD-10-CM | POA: Diagnosis not present

## 2013-11-29 DIAGNOSIS — I428 Other cardiomyopathies: Secondary | ICD-10-CM | POA: Diagnosis not present

## 2013-11-29 DIAGNOSIS — Z87891 Personal history of nicotine dependence: Secondary | ICD-10-CM

## 2013-11-29 DIAGNOSIS — E119 Type 2 diabetes mellitus without complications: Secondary | ICD-10-CM | POA: Diagnosis present

## 2013-11-29 DIAGNOSIS — I6529 Occlusion and stenosis of unspecified carotid artery: Principal | ICD-10-CM | POA: Diagnosis present

## 2013-11-29 DIAGNOSIS — E039 Hypothyroidism, unspecified: Secondary | ICD-10-CM | POA: Diagnosis present

## 2013-11-29 DIAGNOSIS — I739 Peripheral vascular disease, unspecified: Secondary | ICD-10-CM | POA: Diagnosis present

## 2013-11-29 DIAGNOSIS — I10 Essential (primary) hypertension: Secondary | ICD-10-CM | POA: Diagnosis present

## 2013-11-29 DIAGNOSIS — Z7982 Long term (current) use of aspirin: Secondary | ICD-10-CM | POA: Diagnosis not present

## 2013-11-29 DIAGNOSIS — I69998 Other sequelae following unspecified cerebrovascular disease: Secondary | ICD-10-CM | POA: Diagnosis not present

## 2013-11-29 DIAGNOSIS — G4730 Sleep apnea, unspecified: Secondary | ICD-10-CM | POA: Diagnosis present

## 2013-11-29 DIAGNOSIS — E785 Hyperlipidemia, unspecified: Secondary | ICD-10-CM | POA: Diagnosis present

## 2013-11-29 HISTORY — PX: ENDARTERECTOMY: SHX5162

## 2013-11-29 LAB — CBC
HCT: 42.3 % (ref 39.0–52.0)
Hemoglobin: 14.6 g/dL (ref 13.0–17.0)
MCH: 32.7 pg (ref 26.0–34.0)
MCHC: 34.5 g/dL (ref 30.0–36.0)
MCV: 94.6 fL (ref 78.0–100.0)
Platelets: 212 10*3/uL (ref 150–400)
RBC: 4.47 MIL/uL (ref 4.22–5.81)
RDW: 13.2 % (ref 11.5–15.5)
WBC: 10.8 10*3/uL — ABNORMAL HIGH (ref 4.0–10.5)

## 2013-11-29 LAB — GLUCOSE, CAPILLARY
Glucose-Capillary: 127 mg/dL — ABNORMAL HIGH (ref 70–99)
Glucose-Capillary: 116 mg/dL — ABNORMAL HIGH (ref 70–99)
Glucose-Capillary: 160 mg/dL — ABNORMAL HIGH (ref 70–99)

## 2013-11-29 SURGERY — ENDARTERECTOMY, CAROTID
Anesthesia: General | Site: Neck | Laterality: Left

## 2013-11-29 MED ORDER — DAPAGLIFLOZIN PROPANEDIOL 5 MG PO TABS: 5.0000 mg | ORAL_TABLET | Freq: Every evening | ORAL | Status: DC

## 2013-11-29 MED ORDER — ROPINIROLE HCL 0.5 MG PO TABS
0.5000 mg | ORAL_TABLET | Freq: Every day | ORAL | Status: DC
  Administered 2013-11-29: 0.5 mg via ORAL
  Filled 2013-11-29 (×2): qty 1

## 2013-11-29 MED ORDER — HYDRALAZINE HCL 20 MG/ML IJ SOLN: 10.0000 mg | INTRAMUSCULAR | Status: DC | PRN

## 2013-11-29 MED ORDER — FENTANYL CITRATE 0.05 MG/ML IJ SOLN
INTRAMUSCULAR | Status: AC
  Filled 2013-11-29: qty 2

## 2013-11-29 MED ORDER — LIDOCAINE HCL (CARDIAC) 20 MG/ML IV SOLN
INTRAVENOUS | Status: AC
  Filled 2013-11-29: qty 5

## 2013-11-29 MED ORDER — OXYCODONE-ACETAMINOPHEN 5-325 MG PO TABS
1.0000 | ORAL_TABLET | ORAL | Status: DC | PRN
  Administered 2013-11-29: 2 via ORAL
  Administered 2013-11-29 – 2013-11-30 (×2): 1 via ORAL
  Filled 2013-11-29 (×2): qty 1

## 2013-11-29 MED ORDER — PHENOL 1.4 % MT LIQD: 1.0000 | OROMUCOSAL | Status: DC | PRN

## 2013-11-29 MED ORDER — SIMVASTATIN 20 MG PO TABS
20.0000 mg | ORAL_TABLET | Freq: Every day | ORAL | Status: DC
  Filled 2013-11-29: qty 1

## 2013-11-29 MED ORDER — NEOSTIGMINE METHYLSULFATE 1 MG/ML IJ SOLN
INTRAMUSCULAR | Status: AC
  Filled 2013-11-29: qty 10

## 2013-11-29 MED ORDER — ARTIFICIAL TEARS OP OINT
TOPICAL_OINTMENT | OPHTHALMIC | Status: DC | PRN
  Administered 2013-11-29: 1 via OPHTHALMIC

## 2013-11-29 MED ORDER — PROPOFOL 10 MG/ML IV BOLUS
INTRAVENOUS | Status: DC | PRN
  Administered 2013-11-29: 100 mg via INTRAVENOUS
  Administered 2013-11-29: 60 mg via INTRAVENOUS

## 2013-11-29 MED ORDER — DOPAMINE-DEXTROSE 3.2-5 MG/ML-% IV SOLN: 3.0000 ug/kg/min | INTRAVENOUS | Status: DC

## 2013-11-29 MED ORDER — FENTANYL CITRATE 0.05 MG/ML IJ SOLN
25.0000 ug | INTRAMUSCULAR | Status: DC | PRN
  Administered 2013-11-29: 25 ug via INTRAVENOUS
  Administered 2013-11-29 (×2): 50 ug via INTRAVENOUS
  Administered 2013-11-29: 25 ug via INTRAVENOUS

## 2013-11-29 MED ORDER — HEMOSTATIC AGENTS (NO CHARGE) OPTIME
TOPICAL | Status: DC | PRN
  Administered 2013-11-29: 1 via TOPICAL

## 2013-11-29 MED ORDER — ACETAMINOPHEN 650 MG RE SUPP: 325.0000 mg | RECTAL | Status: DC | PRN

## 2013-11-29 MED ORDER — SODIUM CHLORIDE 0.9 % IV SOLN
10.0000 mg | INTRAVENOUS | Status: DC | PRN
  Administered 2013-11-29: 20 ug/min via INTRAVENOUS

## 2013-11-29 MED ORDER — ROCURONIUM BROMIDE 100 MG/10ML IV SOLN
INTRAVENOUS | Status: DC | PRN
  Administered 2013-11-29: 50 mg via INTRAVENOUS

## 2013-11-29 MED ORDER — HEPARIN SODIUM (PORCINE) 1000 UNIT/ML IJ SOLN
INTRAMUSCULAR | Status: AC
  Filled 2013-11-29: qty 1

## 2013-11-29 MED ORDER — LOSARTAN POTASSIUM-HCTZ 100-12.5 MG PO TABS: 1.0000 | ORAL_TABLET | Freq: Every day | ORAL | Status: DC

## 2013-11-29 MED ORDER — SODIUM CHLORIDE 0.9 % IR SOLN
Status: DC | PRN
  Administered 2013-11-29: 13:00:00

## 2013-11-29 MED ORDER — HEPARIN SODIUM (PORCINE) 1000 UNIT/ML IJ SOLN
INTRAMUSCULAR | Status: DC | PRN
  Administered 2013-11-29: 2000 [IU] via INTRAVENOUS
  Administered 2013-11-29: 10000 [IU] via INTRAVENOUS

## 2013-11-29 MED ORDER — PROTAMINE SULFATE 10 MG/ML IV SOLN
INTRAVENOUS | Status: AC
  Filled 2013-11-29: qty 5

## 2013-11-29 MED ORDER — LACTATED RINGERS IV SOLN
INTRAVENOUS | Status: DC | PRN
  Administered 2013-11-29 (×2): via INTRAVENOUS

## 2013-11-29 MED ORDER — ENOXAPARIN SODIUM 40 MG/0.4ML ~~LOC~~ SOLN
40.0000 mg | SUBCUTANEOUS | Status: DC
  Administered 2013-11-30: 40 mg via SUBCUTANEOUS
  Filled 2013-11-29 (×2): qty 0.4

## 2013-11-29 MED ORDER — ACETAMINOPHEN 500 MG PO TABS: 1000.0000 mg | ORAL_TABLET | Freq: Once | ORAL | Status: DC

## 2013-11-29 MED ORDER — LOSARTAN POTASSIUM 50 MG PO TABS
100.0000 mg | ORAL_TABLET | Freq: Every day | ORAL | Status: DC
  Administered 2013-11-29 – 2013-11-30 (×2): 100 mg via ORAL
  Filled 2013-11-29 (×2): qty 2

## 2013-11-29 MED ORDER — ONDANSETRON HCL 4 MG/2ML IJ SOLN
INTRAMUSCULAR | Status: DC | PRN
  Administered 2013-11-29: 4 mg via INTRAVENOUS

## 2013-11-29 MED ORDER — METOPROLOL TARTRATE 1 MG/ML IV SOLN: 2.0000 mg | INTRAVENOUS | Status: DC | PRN

## 2013-11-29 MED ORDER — SCOPOLAMINE 1 MG/3DAYS TD PT72
MEDICATED_PATCH | TRANSDERMAL | Status: AC
  Administered 2013-11-29: 1 via TRANSDERMAL
  Filled 2013-11-29: qty 1

## 2013-11-29 MED ORDER — ACETAMINOPHEN 500 MG PO TABS
1000.0000 mg | ORAL_TABLET | Freq: Once | ORAL | Status: DC
  Administered 2013-11-29: 1000 mg via ORAL

## 2013-11-29 MED ORDER — CLOPIDOGREL BISULFATE 75 MG PO TABS
75.0000 mg | ORAL_TABLET | Freq: Every day | ORAL | Status: DC
  Filled 2013-11-29 (×2): qty 1

## 2013-11-29 MED ORDER — SODIUM CHLORIDE 0.9 % IV SOLN
INTRAVENOUS | Status: DC
  Administered 2013-11-29: 20:00:00 via INTRAVENOUS

## 2013-11-29 MED ORDER — GLYCOPYRROLATE 0.2 MG/ML IJ SOLN
INTRAMUSCULAR | Status: DC | PRN
  Administered 2013-11-29: 0.4 mg via INTRAVENOUS
  Administered 2013-11-29: 0.6 mg via INTRAVENOUS

## 2013-11-29 MED ORDER — 0.9 % SODIUM CHLORIDE (POUR BTL) OPTIME
TOPICAL | Status: DC | PRN
  Administered 2013-11-29 (×2): 1000 mL

## 2013-11-29 MED ORDER — TESTOSTERONE CYPIONATE 200 MG/ML IM SOLN: 200.0000 mg | INTRAMUSCULAR | Status: DC

## 2013-11-29 MED ORDER — SODIUM CHLORIDE 0.9 % IV SOLN: 500.0000 mL | Freq: Once | INTRAVENOUS | Status: AC | PRN

## 2013-11-29 MED ORDER — GUAIFENESIN-DM 100-10 MG/5ML PO SYRP: 15.0000 mL | ORAL_SOLUTION | ORAL | Status: DC | PRN

## 2013-11-29 MED ORDER — MAGNESIUM SULFATE 40 MG/ML IJ SOLN: 2.0000 g | Freq: Every day | INTRAMUSCULAR | Status: DC | PRN

## 2013-11-29 MED ORDER — MORPHINE SULFATE 2 MG/ML IJ SOLN: 2.0000 mg | INTRAMUSCULAR | Status: DC | PRN

## 2013-11-29 MED ORDER — SODIUM CHLORIDE 0.9 % IJ SOLN
2000.0000 ug | INTRAMUSCULAR | Status: DC | PRN
  Administered 2013-11-29: .2 ug/kg/min via INTRAVENOUS

## 2013-11-29 MED ORDER — LIDOCAINE HCL (CARDIAC) 20 MG/ML IV SOLN
INTRAVENOUS | Status: DC | PRN
  Administered 2013-11-29: 100 mg via INTRAVENOUS

## 2013-11-29 MED ORDER — ACETAMINOPHEN 500 MG PO TABS
ORAL_TABLET | ORAL | Status: AC
  Filled 2013-11-29: qty 2

## 2013-11-29 MED ORDER — ONDANSETRON HCL 4 MG/2ML IJ SOLN
INTRAMUSCULAR | Status: AC
  Filled 2013-11-29: qty 2

## 2013-11-29 MED ORDER — SENNOSIDES-DOCUSATE SODIUM 8.6-50 MG PO TABS
1.0000 | ORAL_TABLET | Freq: Every evening | ORAL | Status: DC | PRN
  Filled 2013-11-29: qty 1

## 2013-11-29 MED ORDER — ONDANSETRON HCL 4 MG/2ML IJ SOLN: 4.0000 mg | Freq: Four times a day (QID) | INTRAMUSCULAR | Status: DC | PRN

## 2013-11-29 MED ORDER — DAPAGLIFLOZIN PROPANEDIOL 5 MG PO TABS
5.0000 mg | ORAL_TABLET | Freq: Every evening | ORAL | Status: DC
  Filled 2013-11-29: qty 1

## 2013-11-29 MED ORDER — LABETALOL HCL 5 MG/ML IV SOLN
INTRAVENOUS | Status: AC
  Filled 2013-11-29: qty 4

## 2013-11-29 MED ORDER — ACETAMINOPHEN 325 MG PO TABS: 325.0000 mg | ORAL_TABLET | ORAL | Status: DC | PRN

## 2013-11-29 MED ORDER — LACTATED RINGERS IV SOLN
INTRAVENOUS | Status: DC
  Administered 2013-11-29: 09:00:00 via INTRAVENOUS

## 2013-11-29 MED ORDER — PANTOPRAZOLE SODIUM 40 MG PO TBEC
40.0000 mg | DELAYED_RELEASE_TABLET | Freq: Every day | ORAL | Status: DC
  Administered 2013-11-29 – 2013-11-30 (×2): 40 mg via ORAL
  Filled 2013-11-29: qty 1

## 2013-11-29 MED ORDER — LIDOCAINE HCL (PF) 1 % IJ SOLN
INTRAMUSCULAR | Status: AC
  Filled 2013-11-29: qty 30

## 2013-11-29 MED ORDER — BISACODYL 10 MG RE SUPP: 10.0000 mg | Freq: Every day | RECTAL | Status: DC | PRN

## 2013-11-29 MED ORDER — LABETALOL HCL 5 MG/ML IV SOLN
INTRAVENOUS | Status: DC | PRN
  Administered 2013-11-29 (×2): 10 mg via INTRAVENOUS

## 2013-11-29 MED ORDER — GABAPENTIN 600 MG PO TABS
600.0000 mg | ORAL_TABLET | Freq: Four times a day (QID) | ORAL | Status: DC
  Administered 2013-11-29 – 2013-11-30 (×2): 600 mg via ORAL
  Filled 2013-11-29 (×5): qty 1

## 2013-11-29 MED ORDER — FENTANYL CITRATE 0.05 MG/ML IJ SOLN
INTRAMUSCULAR | Status: AC
  Filled 2013-11-29: qty 5

## 2013-11-29 MED ORDER — HYDROCHLOROTHIAZIDE 12.5 MG PO CAPS
12.5000 mg | ORAL_CAPSULE | Freq: Every day | ORAL | Status: DC
  Administered 2013-11-29 – 2013-11-30 (×2): 12.5 mg via ORAL
  Filled 2013-11-29 (×2): qty 1

## 2013-11-29 MED ORDER — DEXTROSE 5 % IV SOLN
1.5000 g | Freq: Two times a day (BID) | INTRAVENOUS | Status: AC
  Administered 2013-11-29 – 2013-11-30 (×2): 1.5 g via INTRAVENOUS
  Filled 2013-11-29 (×3): qty 1.5

## 2013-11-29 MED ORDER — DOCUSATE SODIUM 100 MG PO CAPS
100.0000 mg | ORAL_CAPSULE | Freq: Every day | ORAL | Status: DC
  Administered 2013-11-30: 100 mg via ORAL
  Filled 2013-11-29: qty 1

## 2013-11-29 MED ORDER — PROPOFOL 10 MG/ML IV BOLUS
INTRAVENOUS | Status: AC
  Filled 2013-11-29: qty 20

## 2013-11-29 MED ORDER — OXYCODONE-ACETAMINOPHEN 5-325 MG PO TABS
ORAL_TABLET | ORAL | Status: AC
  Filled 2013-11-29: qty 2

## 2013-11-29 MED ORDER — POTASSIUM CHLORIDE CRYS ER 20 MEQ PO TBCR: 20.0000 meq | EXTENDED_RELEASE_TABLET | Freq: Every day | ORAL | Status: DC | PRN

## 2013-11-29 MED ORDER — LABETALOL HCL 5 MG/ML IV SOLN: 10.0000 mg | INTRAVENOUS | Status: DC | PRN

## 2013-11-29 MED ORDER — PROTAMINE SULFATE 10 MG/ML IV SOLN
INTRAVENOUS | Status: DC | PRN
  Administered 2013-11-29 (×3): 10 mg via INTRAVENOUS
  Administered 2013-11-29: 20 mg via INTRAVENOUS

## 2013-11-29 MED ORDER — ALUM & MAG HYDROXIDE-SIMETH 200-200-20 MG/5ML PO SUSP: 15.0000 mL | ORAL | Status: DC | PRN

## 2013-11-29 MED ORDER — EPHEDRINE SULFATE 50 MG/ML IJ SOLN
INTRAMUSCULAR | Status: DC | PRN
  Administered 2013-11-29: 5 mg via INTRAVENOUS
  Administered 2013-11-29: 10 mg via INTRAVENOUS

## 2013-11-29 MED ORDER — DEXAMETHASONE SODIUM PHOSPHATE 4 MG/ML IJ SOLN
INTRAMUSCULAR | Status: AC
  Filled 2013-11-29: qty 1

## 2013-11-29 MED ORDER — ASPIRIN EC 81 MG PO TBEC
81.0000 mg | DELAYED_RELEASE_TABLET | Freq: Every day | ORAL | Status: DC
  Administered 2013-11-30: 81 mg via ORAL
  Filled 2013-11-29: qty 1

## 2013-11-29 MED ORDER — GLYCOPYRROLATE 0.2 MG/ML IJ SOLN
INTRAMUSCULAR | Status: AC
  Filled 2013-11-29: qty 3

## 2013-11-29 MED ORDER — ROCURONIUM BROMIDE 50 MG/5ML IV SOLN
INTRAVENOUS | Status: AC
  Filled 2013-11-29: qty 1

## 2013-11-29 MED ORDER — SODIUM CHLORIDE 0.9 % IV SOLN
INTRAVENOUS | Status: DC
Start: 2013-11-29 — End: 2013-11-29

## 2013-11-29 MED ORDER — LEVOTHYROXINE SODIUM 25 MCG PO TABS
25.0000 ug | ORAL_TABLET | Freq: Every day | ORAL | Status: DC
Start: 2013-11-30 — End: 2013-11-30
  Administered 2013-11-30: 25 ug via ORAL
  Filled 2013-11-29 (×2): qty 1

## 2013-11-29 MED ORDER — NEOSTIGMINE METHYLSULFATE 1 MG/ML IJ SOLN
INTRAMUSCULAR | Status: DC | PRN
  Administered 2013-11-29: 4 mg via INTRAVENOUS

## 2013-11-29 MED ORDER — ONDANSETRON HCL 4 MG/2ML IJ SOLN: 4.0000 mg | Freq: Once | INTRAMUSCULAR | Status: DC | PRN

## 2013-11-29 SURGICAL SUPPLY — 59 items
ADH SKN CLS APL DERMABOND .7 (GAUZE/BANDAGES/DRESSINGS) ×1
ADH SKN CLS LQ APL DERMABOND (GAUZE/BANDAGES/DRESSINGS) ×1
BOOT SUTURE AID YELLOW STND (SUTURE) ×1 IMPLANT
CANISTER SUCTION 2500CC (MISCELLANEOUS) ×2 IMPLANT
CATH ROBINSON RED A/P 18FR (CATHETERS) ×2 IMPLANT
CATH SUCT 10FR WHISTLE TIP (CATHETERS) ×2 IMPLANT
CLIP TI MEDIUM 6 (CLIP) ×2 IMPLANT
CLIP TI WIDE RED SMALL 6 (CLIP) ×3 IMPLANT
COVER SURGICAL LIGHT HANDLE (MISCELLANEOUS) ×2 IMPLANT
CRADLE DONUT ADULT HEAD (MISCELLANEOUS) ×2 IMPLANT
DERMABOND ADHESIVE PROPEN (GAUZE/BANDAGES/DRESSINGS) ×1
DERMABOND ADVANCED (GAUZE/BANDAGES/DRESSINGS) ×1
DERMABOND ADVANCED .7 DNX12 (GAUZE/BANDAGES/DRESSINGS) ×1 IMPLANT
DERMABOND ADVANCED .7 DNX6 (GAUZE/BANDAGES/DRESSINGS) IMPLANT
DRAIN CHANNEL 15F RND FF W/TCR (WOUND CARE) IMPLANT
DRAPE PROXIMA HALF (DRAPES) ×1 IMPLANT
DRAPE WARM FLUID 44X44 (DRAPE) ×2 IMPLANT
ELECT REM PT RETURN 9FT ADLT (ELECTROSURGICAL) ×2
ELECTRODE REM PT RTRN 9FT ADLT (ELECTROSURGICAL) ×1 IMPLANT
EVACUATOR SILICONE 100CC (DRAIN) IMPLANT
GLOVE BIO SURGEON STRL SZ7.5 (GLOVE) ×1 IMPLANT
GLOVE BIOGEL PI IND STRL 6.5 (GLOVE) IMPLANT
GLOVE BIOGEL PI IND STRL 7.5 (GLOVE) ×1 IMPLANT
GLOVE BIOGEL PI IND STRL 8 (GLOVE) IMPLANT
GLOVE BIOGEL PI INDICATOR 6.5 (GLOVE) ×1
GLOVE BIOGEL PI INDICATOR 7.5 (GLOVE) ×1
GLOVE BIOGEL PI INDICATOR 8 (GLOVE) ×1
GLOVE ECLIPSE 6.5 STRL STRAW (GLOVE) ×1 IMPLANT
GLOVE SURG SS PI 7.5 STRL IVOR (GLOVE) ×2 IMPLANT
GOWN STRL REUS W/ TWL LRG LVL3 (GOWN DISPOSABLE) ×2 IMPLANT
GOWN STRL REUS W/ TWL XL LVL3 (GOWN DISPOSABLE) ×1 IMPLANT
GOWN STRL REUS W/TWL LRG LVL3 (GOWN DISPOSABLE) ×4
GOWN STRL REUS W/TWL XL LVL3 (GOWN DISPOSABLE) ×2
HEMOSTAT SNOW SURGICEL 2X4 (HEMOSTASIS) ×1 IMPLANT
INSERT FOGARTY SM (MISCELLANEOUS) IMPLANT
KIT BASIN OR (CUSTOM PROCEDURE TRAY) ×2 IMPLANT
KIT ROOM TURNOVER OR (KITS) ×2 IMPLANT
NS IRRIG 1000ML POUR BTL (IV SOLUTION) ×6 IMPLANT
PACK CAROTID (CUSTOM PROCEDURE TRAY) ×2 IMPLANT
PAD ARMBOARD 7.5X6 YLW CONV (MISCELLANEOUS) ×4 IMPLANT
PATCH VASCULAR VASCU GUARD 1X6 (Vascular Products) ×1 IMPLANT
PENCIL BUTTON HOLSTER BLD 10FT (ELECTRODE) IMPLANT
SHUNT CAROTID BYPASS 10 (VASCULAR PRODUCTS) IMPLANT
SHUNT CAROTID BYPASS 12FRX15.5 (VASCULAR PRODUCTS) IMPLANT
SPONGE INTESTINAL PEANUT (DISPOSABLE) ×2 IMPLANT
SUT ETHILON 3 0 PS 1 (SUTURE) IMPLANT
SUT PROLENE 6 0 BV (SUTURE) ×3 IMPLANT
SUT PROLENE 7 0 BV 1 (SUTURE) ×1 IMPLANT
SUT PROLENE 7 0 BV1 MDA (SUTURE) ×2 IMPLANT
SUT SILK 3 0 (SUTURE) ×2
SUT SILK 3 0 TIES 17X18 (SUTURE)
SUT SILK 3-0 18XBRD TIE 12 (SUTURE) IMPLANT
SUT SILK 3-0 18XBRD TIE BLK (SUTURE) IMPLANT
SUT VIC AB 3-0 SH 27 (SUTURE) ×4
SUT VIC AB 3-0 SH 27X BRD (SUTURE) ×2 IMPLANT
SUT VICRYL 4-0 PS2 18IN ABS (SUTURE) ×2 IMPLANT
TOWEL OR 17X24 6PK STRL BLUE (TOWEL DISPOSABLE) ×2 IMPLANT
TOWEL OR 17X26 10 PK STRL BLUE (TOWEL DISPOSABLE) ×2 IMPLANT
WATER STERILE IRR 1000ML POUR (IV SOLUTION) ×2 IMPLANT

## 2013-11-29 NOTE — Op Note (Signed)
Patient name: [name] MRN: [id number] DOB: 1946/03/10 Sex: male  11/29/2013 Pre-operative Diagnosis: Asx   left carotid stenosis Post-operative diagnosis:  Same Surgeon:  Eldridge Abrahams Assistants:  C. Scot Dock Procedure:    left carotid Endarterectomy with bovine pericardial patch angioplasty   #2.  Reection with primary anastomosis, left common carotid artery Anesthesia:  [facility]als Blood Loss:  See anesthesia record Specimens:  Carotid Plaque to pathology  Findings:  95 %stenosis; Thrombus:  none  Indications:  The patient has a history of a right brain stroke.  During his workup for this, he was found to be without significant right carotid stenosis but did have moderate left carotid stenosis.  This is been followed with serial ultrasound.  Most recent ultrasound findings showed a greater than 80% stenosis.  The bifurcation was noted to be high and therefore he was sent for a CT scan to verify his anatomy.  The CT scan showed a radiographic string sign.  Therefore, the decision was made to proceed with surgical endarterectomy.  Procedure:  The patient was identified in the holding area and taken to [place] 12  The patient was then placed supine on the table.   General endotrachial anesthesia was administered.  The patient was prepped and draped in the usual sterile fashion.  A time out was called and antibiotics were administered.  The incision was made along the anterior border of the left sternocleidomastoid muscle.  Cautery was used to dissect through the subcutaneous tissue.  The platysma muscle was divided with cautery.  The internal jugular vein was exposed along its anterior medial border.  The common facial vein was exposed and then divided between 2-0 silk ties and metal clips.  The common carotid artery was then circumferentially exposed and encircled with an umbilical tape.  The vagus nerve was identified and protected.  Next sharp dissection was used to expose the  external carotid artery and the superior thyroid artery.  The were encircled with a blue vessel loop and a 2-0 silk tie respectively.  Finally, the internal carotid was carefully dissected free.  An umbilical tape was placed around the internal carotid artery distal to the diseased segment.  The hypoglossal nerve was visualized throughout and protected.  The patient was given systemic heparinization.  A bovine carotid patch was selected and prepared on the back table.  A 10 french shunt was also prepared.  After blood pressure readings were appropriate and the heparin had been given time to circulate, the internal carotid artery was occluded with a baby Gregory clamp.  The external and common carotid arteries were then occluded with vascular clamps and the 2-0 tie tightened on the superior thyroid artery.  A #11 blade was used to make an arteriotomy in the common carotid artery.  This was extended with Potts scissors along the anterior and lateral border of the common and internal carotid artery.  Approximately 95% stenosis was identified.  There was no thrombus identified.  This did appear to be a ulcerated, ruptured plaque with loose sediment within the plaque.  The 10 french shunt was not placed because the patient has a known loop/kink within his distal artery I did not want to disrupt this.  In addition, there was excellent backbleeding..  A kleiner kuntz elevator was used to perform endarterectomy.  An eversion endarterectomy was performed in the external carotid artery.  A good distal endpoint was obtained in the internal carotid artery.  The specimen was removed  and sent to pathology.  Heparinized saline was used to irrigate the endarterectomized field.  All potential embolic debris was removed.  I had to resect a very redundant common carotid artery.  I did this by placing for stay sutures and then transecting approximately 2 cm of the artery.  I used the 7 sutures to reapproximate the posterior wall.  I  also placed for 7-0 Prolene sutures on the distal endpoint of the plaque.  Bovine pericardial patch angioplasty was then performed using a running 6-0 Prolene. Just prior to completion of the repair, the shunt was removed. The common internal and external carotid arteries were all appropriately flushed. The artery was again irrigated with heparin saline.  The anastomosis was then secured. The clamp was first released on the external carotid artery followed by the common carotid artery approximately 30 seconds later, bloodflow was reestablish through the internal carotid artery.  Next, a hand-held Doppler was used to evaluate the signals in the common, external, and internal carotid arteries, all of which had appropriate signals. I then administered 50 mg protamine. The wound was then irrigated.  After hemostasis was achieved, the carotid sheath was reapproximated with 3-0 Vicryl. The platysma muscle was reapproximated with running 3-0 Vicryl. The skin was closed with 4-0 Vicryl. Dermabond was placed on the skin. The patient was then successfully extubated. His neurologic exam was similar to his preprocedural exam. The patient was then taken to recovery room in stable condition. There were no complications.     Disposition:  To PACU in stable condition.  Relevant Operative Details:  The patient had approximately a 95% lesion.  This was an ulcerated plaque with loose debris throughout the plaque.  I did not place a shunt, because the patient has a known kink/loop in his distal internal carotid artery and I did not want to cause a injury to this.  In addition, the patient excellent backbleeding. I then did a resection of  about 2 cm of the common carotid artery and primarily repaired it with a 7-0 Prolene.  A bovine pericardial patch was selected for patch angioplasty.   Theotis Burrow, M.D. [facility] [place] Office: [phone] Pager:  [phone]

## 2013-11-29 NOTE — Preoperative (Signed)
Beta Blockers   Reason not to administer Beta Blockers:Not Applicable 

## 2013-11-29 NOTE — Transfer of Care (Signed)
Immediate Anesthesia Transfer of Care Note  Patient: [name]  Procedure(s) Performed: Procedure(s): ENDARTERECTOMY CAROTID (Left)  Patient Location: PACU  Anesthesia Type:General  Level of Consciousness: awake, alert , oriented and patient cooperative  Airway & Oxygen Therapy: Patient Spontanous Breathing and Patient connected to nasal cannula oxygen  Post-op Assessment: Report given to PACU RN, Post -op Vital signs reviewed and stable, Patient moving all extremities X 4 and Patient able to stick tongue midline  Post vital signs: Reviewed and stable  Complications: No apparent anesthesia complications

## 2013-11-29 NOTE — Progress Notes (Signed)
Pt arrived from PACU, slightest facial droop, op site puffy. (Plavix pre-op per report). VSS, will continue to monitor.

## 2013-11-29 NOTE — Progress Notes (Signed)
Status post left carotid endarterectomy  Patient is neurologically intact.  He is following commands and moving all 4 extremities voluntarily.  There is a slight hematoma under his incision which is tender.  This has not expanded significantly over the past 2 hours.  I discussed this with the patient and wife.  I recommend monitoring this for now as it does not appear to be expanding and is relatively soft.  [name]

## 2013-11-29 NOTE — H&P (View-Only) (Signed)
Patient name: [name] MRN: [id number] DOB: [birth year] Sex: male     Chief Complaint  Patient presents with  . Re-evaluation    1 wk f/u with CTA neck prior    HISTORY OF PRESENT ILLNESS: The patient is back today for followup of his CT angiogram that was performed to better evaluate his anatomy.  The patient has a history of a right brain stroke with residual left arm weakness.  He has minimal right carotid stenosis.  I have been following his left carotid stenosis which has recently became greater than 80%.  Ultrasound indicated that his bifurcation was high and therefore I sent him for a CT angiogram to better define his anatomy.  He is back for followup.  He endorses no new symptoms.  Past Medical History  Diagnosis Date  . Hypertension   . Diabetes mellitus   . Carotid artery occlusion   . Stroke March 08, 2012  . Hyperlipidemia   . Peripheral arterial disease     nonhealing ulcers bilaterally on each great toe    Past Surgical History  Procedure Laterality Date  . Back surgery    . Cervical fusion    . Foot surgery    . Spine surgery      History   Social History  . Marital Status: Married    Spouse Name: N/A    Number of Children: N/A  . Years of Education: N/A   Occupational History  . Not on file.   Social History Main Topics  . Smoking status: Former Smoker -- 1 years    Types: Pipe  . Smokeless tobacco: Never Used  . Alcohol Use: No  . Drug Use: No  . Sexual Activity: Not on file   Other Topics Concern  . Not on file   Social History Narrative  . No narrative on file    Family History  Problem Relation Age of Onset  . Heart disease Mother   . Hypertension Mother   . Heart attack Mother   . Diabetes Son   . Heart disease Son   . Hypertension Son   . Hypertension Father   . Diabetes Father     Allergies as of 11/19/2013 - Review Complete 11/19/2013  Allergen Reaction Noted  . Codeine Nausea And Vomiting   . Tramadol Nausea  Only 10/23/2012    Current Outpatient Prescriptions on File Prior to Visit  Medication Sig Dispense Refill  . aspirin EC 81 MG tablet Take 81 mg by mouth daily.      . clopidogrel (PLAVIX) 75 MG tablet Take 75 mg by mouth daily.       . Dapagliflozin Propanediol (FARXIGA) 5 MG TABS Take 5 mg by mouth daily.      [name] gabapentin (NEURONTIN) 600 MG tablet Take 600 mg by mouth 4 (four) times daily.       [name] glimepiride (AMARYL) 4 MG tablet Take 4 mg by mouth daily before breakfast.      . levothyroxine (SYNTHROID, LEVOTHROID) 25 MCG tablet Take 25 mcg by mouth daily.       [name] losartan-hydrochlorothiazide (HYZAAR) 100-12.5 MG per tablet Take 1 tablet by mouth daily.      . Pantoprazole Sodium (PROTONIX PO) Take 40 mg by mouth 2 (two) times daily.      . pravastatin (PRAVACHOL) 40 MG tablet daily.      [name] rOPINIRole (REQUIP) 0.25 MG tablet Take 1 tablet (0.25 mg total) by mouth daily.  7 tablet  0  . rOPINIRole (REQUIP) 0.5 MG tablet Take 1 tablet (0.5 mg total) by mouth at bedtime.  30 tablet  11  . sitaGLIPtin (JANUVIA) 100 MG tablet Take 100 mg by mouth daily.      [name] testosterone cypionate (DEPOTESTOTERONE CYPIONATE) 200 MG/ML injection Inject 200 mg into the muscle every 7 (seven) days.        No current facility-administered medications on file prior to visit.     REVIEW OF SYSTEMS: No change from prior visit  PHYSICAL EXAMINATION:   Vital signs are BP 141/75  Pulse 85  Ht 6' (1.829 m)  Wt 249 lb (112.946 kg)  BMI 33.76 kg/m2  SpO2 95% General: The patient appears their stated age. HEENT:  No gross abnormalities Pulmonary:  Non labored breathing Musculoskeletal: There are no major deformities. Neurologic: No focal weakness or paresthesias are detected, Skin: There are no ulcer or rashes noted. Psychiatric: The patient has normal affect. Cardiovascular   Diagnostic Studies I have reviewed his CT angiogram which shows a radiographic string sign within the left carotid artery.  His  bifurcation does not appear to be too high and is at the mid to lower portion of C2.  Assessment: Asymptomatic left carotid stenosis Plan: The patient is already scheduled for a left carotid endarterectomy on March 12.  I again discussed the risks and benefits of surgery including the risk of nerve injury and the risk of stroke.  All his questions were answered today.  Eldridge Abrahams, M.D. [facility] [place] Office: [phone] Pager:  [phone]

## 2013-11-29 NOTE — Anesthesia Preprocedure Evaluation (Signed)
Anesthesia Evaluation  Patient identified by MRN, date of birth, ID band Patient awake    Reviewed: Allergy & Precautions, H&P , NPO status , Patient's Chart, lab work & pertinent test results  History of Anesthesia Complications (+) PONV  Airway Mallampati: III TM Distance: >3 FB Neck ROM: Full    Dental  (+) Lower Dentures, Upper Dentures   Pulmonary shortness of breath and with exertion, sleep apnea and Continuous Positive Airway Pressure Ventilation , former smoker,  breath sounds clear to auscultation        Cardiovascular hypertension, Pt. on medications - angina+ Peripheral Vascular Disease - CAD, - Past MI, - CHF and - DOE Rhythm:Regular Rate:Normal     Neuro/Psych  Neuromuscular disease CVA, No Residual Symptoms negative psych ROS   GI/Hepatic negative GI ROS, Neg liver ROS,   Endo/Other  diabetes, Type 2, Oral Hypoglycemic AgentsHypothyroidism   Renal/GU negative Renal ROS     Musculoskeletal   Abdominal   Peds  Hematology negative hematology ROS (+)   Anesthesia Other Findings   Reproductive/Obstetrics                           Anesthesia Physical Anesthesia Plan  ASA: III  Anesthesia Plan: General   Post-op Pain Management:    Induction: Intravenous  Airway Management Planned: Oral ETT  Additional Equipment: Arterial line  Intra-op Plan:   Post-operative Plan: Extubation in OR  Informed Consent: I have reviewed the patients History and Physical, chart, labs and discussed the procedure including the risks, benefits and alternatives for the proposed anesthesia with the patient or authorized representative who has indicated his/her understanding and acceptance.   Dental advisory given  Plan Discussed with: CRNA and Surgeon  Anesthesia Plan Comments:         Anesthesia Quick Evaluation

## 2013-11-29 NOTE — Progress Notes (Signed)
Dr Trula Zailey Audia at bedside.  States that incision was "oozy" and to cont to monitor at this time

## 2013-11-29 NOTE — Progress Notes (Signed)
Pt has on eyeglasses and dentures in place

## 2013-11-29 NOTE — Anesthesia Postprocedure Evaluation (Signed)
Anesthesia Post Note  Patient: [name]  Procedure(s) Performed: Procedure(s) (LRB): ENDARTERECTOMY CAROTID (Left)  Anesthesia type: general  Patient location: PACU  Post pain: Pain level controlled  Post assessment: Patient's Cardiovascular Status Stable  Last Vitals:  Filed Vitals:   11/29/13 1745  BP: 131/73  Pulse: 64  Temp:   Resp: 14    Post vital signs: Reviewed and stable  Level of consciousness: sedated  Complications: No apparent anesthesia complications

## 2013-11-29 NOTE — Interval H&P Note (Signed)
History and Physical Interval Note:  11/29/2013 12:12 PM  GAY RAPE  has presented today for surgery, with the diagnosis of Carotid stenosis with CVA history  The various methods of treatment have been discussed with the patient and family. After consideration of risks, benefits and other options for treatment, the patient has consented to  Procedure(s): ENDARTERECTOMY CAROTID (Left) as a surgical intervention .  The patient's history has been reviewed, patient examined, no change in status, stable for surgery.  I have reviewed the patient's chart and labs.  Questions were answered to the patient's satisfaction.     Ival Pacer IV, V. WELLS

## 2013-11-30 ENCOUNTER — Encounter (HOSPITAL_COMMUNITY): Payer: Self-pay | Admitting: *Deleted

## 2013-11-30 ENCOUNTER — Telehealth: Payer: Self-pay | Admitting: Surgery

## 2013-11-30 LAB — CREATININE, SERUM
Creatinine, Ser: 0.84 mg/dL (ref 0.50–1.35)
GFR calc Af Amer: >90 mL/min (ref >90)
GFR calc non Af Amer: 87 mL/min — ABNORMAL LOW (ref >90)

## 2013-11-30 LAB — GLUCOSE, CAPILLARY
Glucose-Capillary: 160 mg/dL — ABNORMAL HIGH (ref 70–99)
Glucose-Capillary: 126 mg/dL — ABNORMAL HIGH (ref 70–99)
Glucose-Capillary: 146 mg/dL — ABNORMAL HIGH (ref 70–99)

## 2013-11-30 LAB — CBC
HCT: 42.5 % (ref 39.0–52.0)
Hemoglobin: 14.5 g/dL (ref 13.0–17.0)
MCH: 32.5 pg (ref 26.0–34.0)
MCHC: 34.1 g/dL (ref 30.0–36.0)
MCV: 95.3 fL (ref 78.0–100.0)
Platelets: 199 10*3/uL (ref 150–400)
RBC: 4.46 MIL/uL (ref 4.22–5.81)
RDW: 13.3 % (ref 11.5–15.5)
WBC: 12.7 10*3/uL — ABNORMAL HIGH (ref 4.0–10.5)

## 2013-11-30 LAB — BASIC METABOLIC PANEL
BUN: 12 mg/dL (ref 6–23)
CO2: 24 mEq/L (ref 19–32)
Calcium: 8.4 mg/dL (ref 8.4–10.5)
Chloride: 101 mEq/L (ref 96–112)
Creatinine, Ser: 0.78 mg/dL (ref 0.50–1.35)
GFR calc Af Amer: >90 mL/min (ref >90)
GFR calc non Af Amer: 89 mL/min — ABNORMAL LOW (ref >90)
Glucose, Bld: 125 mg/dL — ABNORMAL HIGH (ref 70–99)
Potassium: 4.1 mEq/L (ref 3.7–5.3)
Sodium: 140 mEq/L (ref 137–147)

## 2013-11-30 MED ORDER — OXYCODONE HCL 5 MG PO TABS: 5.0000 mg | ORAL_TABLET | Freq: Four times a day (QID) | ORAL | Status: DC | PRN

## 2013-11-30 NOTE — Progress Notes (Signed)
[facility] Progress Note  11/30/2013 2:05 PM 1 Day Post-Op  Subjective:  "I'm feeling much better now"    Filed Vitals:   11/30/13 0717  BP: 118/66  Pulse: 78  Temp: 97.9 F (36.6 C)  Resp: 17     Physical Exam: Neuro:  In tact Incision:  C/d/i-still with hematoma, but has not worsened.  Pt does not have any trouble with breathing or swallowing.  CBC    Component Value Date/Time   WBC 12.7* 11/30/2013 0630   RBC 4.46 11/30/2013 0630   HGB 14.5 11/30/2013 0630   HCT 42.5 11/30/2013 0630   PLT 199 11/30/2013 0630   MCV 95.3 11/30/2013 0630   MCH 32.5 11/30/2013 0630   MCHC 34.1 11/30/2013 0630   RDW 13.3 11/30/2013 0630   LYMPHSABS 1.1 03/09/2012 2016   MONOABS 0.6 03/09/2012 2016   EOSABS 0.1 03/09/2012 2016   BASOSABS 0.0 03/09/2012 2016    BMET    Component Value Date/Time   NA 140 11/30/2013 0630   K 4.1 11/30/2013 0630   CL 101 11/30/2013 0630   CO2 24 11/30/2013 0630   GLUCOSE 125* 11/30/2013 0630   BUN 12 11/30/2013 0630   CREATININE 0.78 11/30/2013 0630   CREATININE 1.01 11/16/2013 1232   CALCIUM 8.4 11/30/2013 0630   GFRNONAA 89* 11/30/2013 0630   GFRAA >90 11/30/2013 0630     Intake/Output Summary (Last 24 hours) at 11/30/13 1405 Last data filed at 11/30/13 1100  Gross per 24 hour  Intake 2151.25 ml  Output   2600 ml  Net -448.75 ml      Assessment/Plan:  This is a 68 y.o. male who is s/p  left carotid Endarterectomy with bovine pericardial patch angioplasty  #2. Reection with primary anastomosis, left common carotid artery  1 Day Post-Op  -pt is doing well this afternoon.  He feels much better and more stable -pt neuro exam is in tact -pt has not ambulated, but is getting ready to walk in the hall with the nurse.  He states that he has a different walk due to past back surgery. -pt has voided -f/u with Dr. [name] in 2 weeks.   Leontine Locket, PA-C [facility] [phone]

## 2013-11-30 NOTE — Progress Notes (Addendum)
[facility] Progress Note  11/30/2013 7:27 AM 1 Day Post-Op  Subjective:  "My lip is numb and I'm dizzy"  Afebrile HR 60's80's (70's since 7pm) 174'B-449'Q systolic 75% RA  Filed Vitals:   11/30/13 0600  BP: 139/70  Pulse: 73  Temp:   Resp: 21     Physical Exam: Neuro:  In tact Incision:  + hematoma (per Dr. [name], this has not changed since the PACU),  CBC    Component Value Date/Time   WBC 12.7* 11/30/2013 0630   RBC 4.46 11/30/2013 0630   HGB 14.5 11/30/2013 0630   HCT 42.5 11/30/2013 0630   PLT 199 11/30/2013 0630   MCV 95.3 11/30/2013 0630   MCH 32.5 11/30/2013 0630   MCHC 34.1 11/30/2013 0630   RDW 13.3 11/30/2013 0630   LYMPHSABS 1.1 03/09/2012 2016   MONOABS 0.6 03/09/2012 2016   EOSABS 0.1 03/09/2012 2016   BASOSABS 0.0 03/09/2012 2016    BMET    Component Value Date/Time   NA 140 11/23/2013 1529   K 3.9 11/23/2013 1529   CL 100 11/23/2013 1529   CO2 28 11/23/2013 1529   GLUCOSE 143* 11/23/2013 1529   BUN 14 11/23/2013 1529   CREATININE 0.84 11/29/2013 2330   CREATININE 1.01 11/16/2013 1232   CALCIUM 9.2 11/23/2013 1529   GFRNONAA 87* 11/29/2013 2330   GFRAA >90 11/29/2013 2330     Intake/Output Summary (Last 24 hours) at 11/30/13 0727 Last data filed at 11/30/13 0007  Gross per 24 hour  Intake 2151.25 ml  Output    700 ml  Net 1451.25 ml      Assessment/Plan:  This is a 68 y.o. male who is s/p: left carotid Endarterectomy with bovine pericardial patch angioplasty  #2. Reection with primary anastomosis, left common carotid artery  1 Day Post-Op  -pt is doing well this am. -pt neuro exam is in tact -pt has not ambulated -pt has voided -will keep pt today-if his hematoma is stable and pt's dizziness is improved by this afternoon, he may be able to d/c home then, otherwise, will d/c home tomorrow.   Leontine Locket, PA-C [facility] [phone]  I agree with the above.  Because of the hematoma and his left neck, I will  monitor him throughout the day today.  He potentially could go home this evening or likely tomorrow.  [name]

## 2013-11-30 NOTE — Discharge Summary (Signed)
[facility] Discharge [place] 05-01-1946 68 y.o. male  [id number]  Admission Date: 11/29/2013  Discharge Date: 11/30/13  Physician: No att. providers found  Admission Diagnosis: Carotid stenosis with CVA history   HPI:   This is a 68 y.o. male patient is back today for followup of his CT angiogram that was performed to better evaluate his anatomy. The patient has a history of a right brain stroke with residual left arm weakness. He has minimal right carotid stenosis. I have been following his left carotid stenosis which has recently became greater than 80%. Ultrasound indicated that his bifurcation was high and therefore I sent him for a CT angiogram to better define his anatomy. He is back for followup. He endorses no new symptoms.  Hospital Course:  The patient was admitted to the hospital and taken to the operating room on 11/29/2013 and underwent  left carotid Endarterectomy with bovine pericardial patch angioplasty  #2. Reection with primary anastomosis, left common carotid artery    The pt tolerated the procedure well and was transported to the PACU in good condition.  That afternoon, the pt was neurologically intact and following commands.  He did have a slight hematoma under his incision that was tender.  It had not expanded and Dr. [name] discussed this with the pt and his wife.   By POD 1, the pt neuro status was in tact.  He still had a hematoma, but this was unchanged from the PACU.  The pt was not having any difficulty breathing.  He was complaining of some dizziness, but this was completely resolved by the afternoon and the pt was discharged home.  The remainder of the hospital course consisted of increasing mobilization and increasing intake of solids without difficulty.   BMET    Component Value Date/Time   NA 140 11/30/2013 0630   K 4.1 11/30/2013 0630   CL 101 11/30/2013 0630   CO2 24 11/30/2013 0630   GLUCOSE 125* 11/30/2013 0630    BUN 12 11/30/2013 0630   CREATININE 0.78 11/30/2013 0630   CREATININE 1.01 11/16/2013 1232   CALCIUM 8.4 11/30/2013 0630   GFRNONAA 89* 11/30/2013 0630   GFRAA >90 11/30/2013 0630   CBC    Component Value Date/Time   WBC 12.7* 11/30/2013 0630   RBC 4.46 11/30/2013 0630   HGB 14.5 11/30/2013 0630   HCT 42.5 11/30/2013 0630   PLT 199 11/30/2013 0630   MCV 95.3 11/30/2013 0630   MCH 32.5 11/30/2013 0630   MCHC 34.1 11/30/2013 0630   RDW 13.3 11/30/2013 0630   LYMPHSABS 1.1 03/09/2012 2016   MONOABS 0.6 03/09/2012 2016   EOSABS 0.1 03/09/2012 2016   BASOSABS 0.0 03/09/2012 2016     No results found for this basename: WBC, HGB, HCT, PLT,  in the last 72 hours No results found for this basename: INR,  in the last 72 hours  Discharge Instructions:   The patient is discharged to home with extensive instructions on wound care and progressive ambulation.  They are instructed not to drive or perform any heavy lifting until returning to see the physician in his office.      Discharge Orders   Future Appointments Provider Department Dept Phone   12/10/2013 9:15 AM Serafina Mitchell, MD [facility] [phone]   Future Orders Complete By Expires   Call MD for:  redness, tenderness, or signs of infection (pain, swelling, bleeding, redness, odor or green/yellow discharge around  incision site)  As directed    Call MD for:  severe or increased pain, loss or decreased feeling  in affected limb(s)  As directed    Call MD for:  temperature >100.5  As directed    Discharge wound care:  As directed    Comments:     Shower daily with soap and water starting 12/01/13   Driving Restrictions  As directed    Comments:     No driving for 2 weeks   Lifting restrictions  As directed    Comments:     No lifting for 2 weeks   Resume previous diet  As directed       Discharge Diagnosis:  Carotid stenosis with CVA history  Secondary Diagnosis: Patient Active Problem List   Diagnosis  Date Noted  . Aftercare following surgery of the circulatory system, NEC 12/04/2013  . Carotid stenosis 11/29/2013  . Restless leg syndrome 10/23/2013  . Sleep apnea 10/23/2013  . Obesity (BMI 30.0-34.9) 10/23/2013  . Hyperlipidemia 07/16/2013  . Peripheral arterial disease 07/16/2013  . Occlusion and stenosis of carotid artery without mention of cerebral infarction 04/24/2012  . History of stroke June 2013 03/14/2012  . Hypertension 03/14/2012  . Diabetes mellitus type 2 in obese 03/14/2012  . Peripheral neuropathy 03/14/2012  . Cardiomyopathy- EF NL 5/14 03/14/2012  . ARTHRITIS, RIGHT FOOT 06/26/2008   Past Medical History  Diagnosis Date  . Hypertension   . Diabetes mellitus   . Carotid artery occlusion   . Stroke March 08, 2012  . Hyperlipidemia   . Peripheral arterial disease     nonhealing ulcers bilaterally on each great toe  . PONV (postoperative nausea and vomiting)   . Arthritis   . Hypothyroidism   . Shortness of breath       Medication List         aspirin EC 81 MG tablet  Take 81 mg by mouth daily.     clopidogrel 75 MG tablet  Commonly known as:  PLAVIX  Take 75 mg by mouth daily.     FARXIGA 5 MG Tabs  Generic drug:  Dapagliflozin Propanediol  Take 5 mg by mouth every evening.     gabapentin 600 MG tablet  Commonly known as:  NEURONTIN  Take 600 mg by mouth 4 (four) times daily.     levothyroxine 25 MCG tablet  Commonly known as:  SYNTHROID, LEVOTHROID  Take 25 mcg by mouth daily.     losartan-hydrochlorothiazide 100-12.5 MG per tablet  Commonly known as:  HYZAAR  Take 1 tablet by mouth daily.     ONE-A-DAY 50 PLUS PO  Take 1 tablet by mouth daily.     oxyCODONE 5 MG immediate release tablet  Commonly known as:  ROXICODONE  Take 1 tablet (5 mg total) by mouth every 6 (six) hours as needed for severe pain.     pravastatin 40 MG tablet  Commonly known as:  PRAVACHOL  Take 40 mg by mouth every morning.     PROTONIX PO  Take 40 mg by  mouth 2 (two) times daily.     rOPINIRole 0.5 MG tablet  Commonly known as:  REQUIP  Take 1 tablet (0.5 mg total) by mouth at bedtime.     sitaGLIPtin 100 MG tablet  Commonly known as:  JANUVIA  Take 100 mg by mouth every evening.     testosterone cypionate 200 MG/ML injection  Commonly known as:  DEPOTESTOTERONE CYPIONATE  Inject 200 mg into  the muscle every 7 (seven) days.        Roxicodone #30 No Refill  Disposition: home  Patient's condition: is Good  Follow up: 1. Dr.  [name] in 2 weeks.   Leontine Locket, PA-C [facility] [phone]  --- For [facility] use --- Instructions: Press F2 to tab through selections.  Delete question if not applicable.   Modified Rankin score at D/C (0-6): 0  IV medication needed for:  1. Hypertension: No 2. Hypotension: No  Post-op Complications: Yes-complication  1. Post-op CVA or TIA: No  If yes: Event classification (right eye, left eye, right cortical, left cortical, verterobasilar, other): n/a  If yes: Timing of event (intra-op, <6 hrs post-op, >=6 hrs post-op, unknown): n/a  2. CN injury: No  If yes: CN n/a injuried   3. Myocardial infarction: No  If yes: Dx by (EKG or clinical, Troponin): n/a  4.  CHF: No  5.  Dysrhythmia (new): No  6. Wound infection: No  7. Reperfusion symptoms: No  8. Return to OR: No  If yes: return to OR for (bleeding, neurologic, other CEA incision, other): n   Discharge medications: Statin use:  Yes If No: [ ]  For Medical reasons, [ ]  Non-compliant, [ ]  Not-indicated ASA use:  Yes  If No: [ ]  For Medical reasons, [ ]  Non-compliant, [ ]  Not-indicated Beta blocker use:  No If No: [ ]  For Medical reasons, [ ]  Non-compliant, [ ]  Not-indicated ACE-Inhibitor use:  No If No: [ ]  For Medical reasons, [ ]  Non-compliant, [ ]  Not-indicated ARB:  yes P2Y12 Antagonist use: Yes, [x ] Plavix, [ ]  Plasugrel, [ ]  Ticlopinine, [ ]  Ticagrelor, [ ]  Other, [ ]  No for medical  reason, [ ]  Non-compliant, [ ]  Not-indicated Anti-coagulant use:  No, [ ]  Warfarin, [ ]  Rivaroxaban, [ ]  Dabigatran, [ ]  Other, [ ]  No for medical reason, [ ]  Non-compliant, [ ]  Not-indicated

## 2013-11-30 NOTE — Progress Notes (Signed)
Utilization review completed. Brycen Bean, RN, BSN. 

## 2013-11-30 NOTE — Progress Notes (Signed)
Pt d/c home per MD order, pt tol well, pt VSS, instructions given, all questions answered, family at [facility], pt wheeled in [facility]

## 2013-11-30 NOTE — Telephone Encounter (Addendum)
Message copied by Gena Fray on Fri Nov 30, 2013  3:50 PM ------      Message from: Denman George      Created: Fri Nov 30, 2013 10:21 AM      Regarding: Micheline Rough                   ----- Message -----         From: Gabriel Earing, PA-C         Sent: 11/30/2013   7:33 AM           To: [facility] Charge Pool            S/p left CEA 11/29/13.  F/u with Dr. [name] in 2 weeks.            Thanks,      Samantha ------  11/30/13: lm for pt re appt, dpm

## 2013-11-30 NOTE — Anesthesia Postprocedure Evaluation (Signed)
  Anesthesia Post-op Note  Patient: [name]  Procedure(s) Performed: Procedure(s): ENDARTERECTOMY CAROTID (Left)  Patient Location: PACU  Anesthesia Type:General  Level of Consciousness: awake, alert  and oriented  Airway and Oxygen Therapy: Patient Spontanous Breathing  Post-op Pain: none  Post-op Assessment: Post-op Vital signs reviewed, Patient's Cardiovascular Status Stable, Respiratory Function Stable, Patent Airway, No signs of Nausea or vomiting and Pain level controlled  Post-op Vital Signs: Reviewed and stable  Complications: No apparent anesthesia complications

## 2013-12-01 NOTE — Progress Notes (Signed)
The patient's wife called me this morning and stated that he had a new area of concern below his chin.  He was discharged to home yesterday with a stable left neck hematoma following left carotid endarterectomy.  I recommended that they come to the emergency department.  I was able to see the patient in the waiting area before he had been taken back to the emergency department.  It appears that there is more fullness below his chin.  This is the deep ended portion of his neck and I think that this new area is just where the blood/fluid has migrated.  The left carotid incision hematoma remained stable and soft but tender.  The new area is very soft.  The patient reports that he has a little bit of trouble with breathing however he attributes this to his sinus problems.  He has minimal trouble swallowing.  I had a lengthy conversation with the patient and his family.  We discussed what to look out for and to monitor.  I do not think that he needs surgical evacuation at this time.  I am going to let him return home.  They will contact me if there are any other changes.  [name]

## 2013-12-04 ENCOUNTER — Encounter: Payer: Self-pay | Admitting: Family

## 2013-12-04 ENCOUNTER — Ambulatory Visit (INDEPENDENT_AMBULATORY_CARE_PROVIDER_SITE_OTHER): Payer: Self-pay | Admitting: Family

## 2013-12-04 ENCOUNTER — Telehealth: Payer: Self-pay

## 2013-12-04 ENCOUNTER — Encounter (HOSPITAL_COMMUNITY): Payer: Self-pay | Admitting: Surgery

## 2013-12-04 VITALS — BP 150/82 | HR 88 | Temp 97.9°F | Resp 18 | Ht 72.0 in | Wt 241.0 lb

## 2013-12-04 DIAGNOSIS — I6529 Occlusion and stenosis of unspecified carotid artery: Secondary | ICD-10-CM

## 2013-12-04 DIAGNOSIS — Z48812 Encounter for surgical aftercare following surgery on the circulatory system: Secondary | ICD-10-CM | POA: Insufficient documentation

## 2013-12-04 HISTORY — DX: Encounter for surgical aftercare following surgery on the circulatory system: Z48.812

## 2013-12-04 NOTE — Progress Notes (Signed)
Established Carotid Patient   History of Present Illness  [name] is a 68 y.o. male patient of Dr. [name] who is s/p left CEA on 11/29/2013. He had a stroke on 03/08/2012 as manifested by decreased dexterity in left hand, he is left hand dominant and expressive aphasia. The expressive aphasia resolved in about 15 minutes.   He returns today for c/o hard area on incision and pressure sensation in throat when he rotates his head.  He denies dyspnea, denies dyspahgia.   Pt Diabetic: Yes, seems in fairly good control Pt smoker: former smoker  Pt meds include: Statin : Yes ASA: Yes Other anticoagulants/antiplatelets: Plavix   Past Medical History  Diagnosis Date  . Hypertension   . Diabetes mellitus   . Carotid artery occlusion   . Stroke March 08, 2012  . Hyperlipidemia   . Peripheral arterial disease     nonhealing ulcers bilaterally on each great toe  . PONV (postoperative nausea and vomiting)   . Arthritis   . Hypothyroidism   . Shortness of breath     Social History History  Substance Use Topics  . Smoking status: Former Smoker -- 1 years    Types: Pipe  . Smokeless tobacco: Never Used  . Alcohol Use: No    Family History Family History  Problem Relation Age of Onset  . Heart disease Mother   . Hypertension Mother   . Heart attack Mother   . Diabetes Son   . Heart disease Son   . Hypertension Son   . Hypertension Father   . Diabetes Father     Surgical History Past Surgical History  Procedure Laterality Date  . Back surgery    . Cervical fusion    . Foot surgery    . Spine surgery    . Eye surgery    . Endarterectomy Left 11/29/2013    Procedure: ENDARTERECTOMY CAROTID;  Surgeon: Serafina Mitchell, MD;  Location: [facility] OR;  Service: Vascular;  Laterality: Left;  . Carotid endarterectomy Left     Allergies  Allergen Reactions  . Codeine Nausea And Vomiting  . Tramadol Nausea Only    Current Outpatient Prescriptions  Medication Sig Dispense  Refill  . aspirin EC 81 MG tablet Take 81 mg by mouth daily.      . clopidogrel (PLAVIX) 75 MG tablet Take 75 mg by mouth daily.       . Dapagliflozin Propanediol (FARXIGA) 5 MG TABS Take 5 mg by mouth every evening.       . gabapentin (NEURONTIN) 600 MG tablet Take 600 mg by mouth 4 (four) times daily.       [name] levothyroxine (SYNTHROID, LEVOTHROID) 25 MCG tablet Take 25 mcg by mouth daily.       [name] losartan-hydrochlorothiazide (HYZAAR) 100-12.5 MG per tablet Take 1 tablet by mouth daily.      . Multiple Vitamins-Minerals (ONE-A-DAY 50 PLUS PO) Take 1 tablet by mouth daily.      [name] oxyCODONE (ROXICODONE) 5 MG immediate release tablet Take 1 tablet (5 mg total) by mouth every 6 (six) hours as needed for severe pain.  30 tablet  0  . Pantoprazole Sodium (PROTONIX PO) Take 40 mg by mouth 2 (two) times daily.      . pravastatin (PRAVACHOL) 40 MG tablet Take 40 mg by mouth every morning.       [name] rOPINIRole (REQUIP) 0.5 MG tablet Take 1 tablet (0.5 mg total) by mouth at bedtime.  30 tablet  11  . sitaGLIPtin (JANUVIA) 100 MG tablet Take 100 mg by mouth every evening.       . testosterone cypionate (DEPOTESTOTERONE CYPIONATE) 200 MG/ML injection Inject 200 mg into the muscle every 7 (seven) days.        No current facility-administered medications for this visit.    Review of Systems : See HPI for pertinent positives and negatives.  Physical Examination  Filed Vitals:   12/04/13 1524  BP: 150/82  Pulse: 88  Temp:   Resp:    Filed Weights   12/04/13 1519  Weight: 241 lb (109.317 kg)   Body mass index is 32.68 kg/(m^2).  General: WDWN obese male in NAD GAIT: normal Eyes: PERRLA Pulmonary:  Non-labored, CTAB, Negative  Rales, Negative rhonchi, & Negative wheezing.  Cardiac: regular Rhythm ,  Negative detected murmur.  VASCULAR EXAM Carotid Bruits Left Right   Negative Negative   Left CEA incision has swelling, non pulsatile, no active drainage. Radial pulses are 2+ palpable and  equal.                                                                                                                            LE Pulses LEFT RIGHT       POPLITEAL  not palpable   not palpable    Gastrointestinal: soft, nontender, BS WNL, no r/g,  negative masses.  Musculoskeletal: Negative muscle atrophy/wasting. M/S 5/5 throughout, Extremities without ischemic changes.  Neurologic: A&O X 3; Appropriate Affect ; SENSATION ;normal;  Speech is normal CN 2-12 intact except some numbness at left side of mouth, Pain and light touch intact in extremities, Motor exam as listed above.  Assessment: [name] is a 68 y.o. male who is s/p left CEA on 11/29/2013 and presents with c/o hard area on incision and pressure sensation in throat when he rotates his head.  He has somewhat more of a hematoma than usual post CEA, but has no airway compromise, trachea is midline. Dr. Donnetta Hutching examined patient also.   Plan: Follow-up on 12/17/2013 with Dr. [name] instead of 12/10/2013, in 2 weeks.   I discussed in depth with the patient the nature of atherosclerosis, and emphasized the importance of maximal medical management including strict control of blood pressure, blood glucose, and lipid levels, obtaining regular exercise, and continued cessation of smoking.  The patient is aware that without maximal medical management the underlying atherosclerotic disease process will progress, limiting the benefit of any interventions. The patient was given information about stroke prevention and what symptoms should prompt the patient to seek immediate medical care. Thank you for allowing [place] to participate in this patient's care.  Clemon Chambers, RN, MSN, FNP-C [facility] Office: [phone]  Clinic Physician: Kellie Simmering  12/04/2013 3:38 PM

## 2013-12-04 NOTE — Patient Instructions (Addendum)

## 2013-12-04 NOTE — Telephone Encounter (Signed)
Wife called to report husband c/o incisional area left neck feels "hard".  Reports that he c/o a feeling like there is increased pressure on his throat.  Denies difficulty swallowing or breathing.  Stated there is a bruising in general incisional area.  Stated she noted small amt. of bloody drainage on his T shirt, when he got up this morning.  Denies any active drainage/ bleeding from incision at this time.  Describes that there are "small knots" along the incision.  Instructed wife to check for a pulsation in the area of firmness on incision; reported she thought she could feel "a little pulse".  Discussed with Dr. Donnetta Hutching.  Advised to bring pt. In for exam this afternoon.  Appt. given @ 3:00 PM to have evaluation by S. Nickel, NP.  Wife agrees with plan.

## 2013-12-07 ENCOUNTER — Encounter: Payer: Self-pay | Admitting: Surgery

## 2013-12-09 NOTE — Discharge Summary (Signed)
I agree with the above  [name] 

## 2013-12-10 ENCOUNTER — Encounter: Payer: Medicare Other | Admitting: Surgery

## 2013-12-10 ENCOUNTER — Ambulatory Visit (INDEPENDENT_AMBULATORY_CARE_PROVIDER_SITE_OTHER): Payer: Self-pay | Admitting: Surgery

## 2013-12-10 ENCOUNTER — Encounter: Payer: Self-pay | Admitting: Surgery

## 2013-12-10 VITALS — BP 129/70 | HR 72 | Temp 97.8°F | Ht 72.0 in | Wt 247.8 lb

## 2013-12-10 DIAGNOSIS — I6529 Occlusion and stenosis of unspecified carotid artery: Secondary | ICD-10-CM

## 2013-12-10 NOTE — Addendum Note (Signed)
Addended by: Dorthula Rue L on: 12/10/2013 04:43 PM   Modules accepted: Orders

## 2013-12-10 NOTE — Progress Notes (Signed)
The patient is here today for followup.  On 11/29/2013, he underwent a left carotid endarterectomy with bovine pericardial patch angioplasty.  I also performed resection with primary anastomosis of his left common carotid artery.  This was done for asymptomatic left carotid stenosis.  Intraoperative findings included a 95% stenosis.  Because of the patient's prior right brain stroke, I did not stop his Plavix.  The patient did develop a postoperative hematoma.  I even saw him in the emergency department postoperative day 2 because he fell it was getting bigger.  No intervention was required.  The patient is back today for followup.  He has no complaints other than swelling in his left neck.  On physical examination there is swelling within the left incision consistent with previous hematoma which has not yet resolved.  He remains neurologically intact.  His incision is clean.  I again reassured the patient that this hematoma will improve with time and should completely resolve.  We discussed that at this time there is no role for surgical evacuation.  All his questions were answered.  He will followup with me in 6 months with a repeat carotid ultrasound.

## 2013-12-17 ENCOUNTER — Encounter: Payer: Self-pay | Admitting: *Deleted

## 2013-12-24 DIAGNOSIS — L97509 Non-pressure chronic ulcer of other part of unspecified foot with unspecified severity: Secondary | ICD-10-CM | POA: Diagnosis not present

## 2013-12-26 ENCOUNTER — Telehealth: Payer: Self-pay | Admitting: *Deleted

## 2013-12-26 DIAGNOSIS — I739 Peripheral vascular disease, unspecified: Secondary | ICD-10-CM | POA: Diagnosis not present

## 2013-12-26 DIAGNOSIS — E291 Testicular hypofunction: Secondary | ICD-10-CM | POA: Diagnosis not present

## 2013-12-26 DIAGNOSIS — I10 Essential (primary) hypertension: Secondary | ICD-10-CM | POA: Diagnosis not present

## 2013-12-26 DIAGNOSIS — E109 Type 1 diabetes mellitus without complications: Secondary | ICD-10-CM | POA: Diagnosis not present

## 2013-12-26 NOTE — Telephone Encounter (Signed)
Returned signed CPAP supply order.

## 2013-12-31 DIAGNOSIS — I739 Peripheral vascular disease, unspecified: Secondary | ICD-10-CM | POA: Diagnosis not present

## 2013-12-31 DIAGNOSIS — E109 Type 1 diabetes mellitus without complications: Secondary | ICD-10-CM | POA: Diagnosis not present

## 2013-12-31 DIAGNOSIS — I10 Essential (primary) hypertension: Secondary | ICD-10-CM | POA: Diagnosis not present

## 2014-01-02 DIAGNOSIS — I10 Essential (primary) hypertension: Secondary | ICD-10-CM | POA: Diagnosis not present

## 2014-01-07 DIAGNOSIS — I10 Essential (primary) hypertension: Secondary | ICD-10-CM | POA: Diagnosis not present

## 2014-01-14 DIAGNOSIS — I739 Peripheral vascular disease, unspecified: Secondary | ICD-10-CM | POA: Diagnosis not present

## 2014-01-14 DIAGNOSIS — R269 Unspecified abnormalities of gait and mobility: Secondary | ICD-10-CM | POA: Diagnosis not present

## 2014-01-14 DIAGNOSIS — E1159 Type 2 diabetes mellitus with other circulatory complications: Secondary | ICD-10-CM | POA: Diagnosis not present

## 2014-01-14 DIAGNOSIS — L84 Corns and callosities: Secondary | ICD-10-CM | POA: Diagnosis not present

## 2014-01-14 DIAGNOSIS — L608 Other nail disorders: Secondary | ICD-10-CM | POA: Diagnosis not present

## 2014-01-29 ENCOUNTER — Other Ambulatory Visit (HOSPITAL_COMMUNITY): Payer: Self-pay | Admitting: Pulmonary Disease

## 2014-01-29 ENCOUNTER — Ambulatory Visit (HOSPITAL_COMMUNITY)
Admission: RE | Admit: 2014-01-29 | Discharge: 2014-01-29 | Disposition: A | Discharge status: Home / Self Care | Payer: Medicare Other | Source: Ambulatory Visit | Attending: Pulmonary Disease | Admitting: Pulmonary Disease

## 2014-01-29 DIAGNOSIS — R05 Cough: Secondary | ICD-10-CM

## 2014-01-29 DIAGNOSIS — R059 Cough, unspecified: Secondary | ICD-10-CM

## 2014-01-29 DIAGNOSIS — I10 Essential (primary) hypertension: Secondary | ICD-10-CM | POA: Insufficient documentation

## 2014-01-29 DIAGNOSIS — R5381 Other malaise: Secondary | ICD-10-CM | POA: Diagnosis not present

## 2014-01-29 DIAGNOSIS — E109 Type 1 diabetes mellitus without complications: Secondary | ICD-10-CM | POA: Diagnosis not present

## 2014-01-29 DIAGNOSIS — E119 Type 2 diabetes mellitus without complications: Secondary | ICD-10-CM | POA: Diagnosis not present

## 2014-01-29 DIAGNOSIS — I6789 Other cerebrovascular disease: Secondary | ICD-10-CM | POA: Diagnosis not present

## 2014-01-29 DIAGNOSIS — J40 Bronchitis, not specified as acute or chronic: Secondary | ICD-10-CM | POA: Diagnosis not present

## 2014-01-29 DIAGNOSIS — I517 Cardiomegaly: Secondary | ICD-10-CM | POA: Diagnosis not present

## 2014-01-31 DIAGNOSIS — M2030 Hallux varus (acquired), unspecified foot: Secondary | ICD-10-CM | POA: Diagnosis not present

## 2014-01-31 DIAGNOSIS — L02619 Cutaneous abscess of unspecified foot: Secondary | ICD-10-CM | POA: Diagnosis not present

## 2014-01-31 DIAGNOSIS — L03039 Cellulitis of unspecified toe: Secondary | ICD-10-CM | POA: Diagnosis not present

## 2014-02-04 DIAGNOSIS — L97509 Non-pressure chronic ulcer of other part of unspecified foot with unspecified severity: Secondary | ICD-10-CM | POA: Diagnosis not present

## 2014-02-07 DIAGNOSIS — L97509 Non-pressure chronic ulcer of other part of unspecified foot with unspecified severity: Secondary | ICD-10-CM | POA: Diagnosis not present

## 2014-02-12 ENCOUNTER — Ambulatory Visit (INDEPENDENT_AMBULATORY_CARE_PROVIDER_SITE_OTHER): Payer: Medicare Other | Admitting: Cardiovascular Disease

## 2014-02-12 ENCOUNTER — Encounter: Payer: Self-pay | Admitting: Cardiovascular Disease

## 2014-02-12 VITALS — BP 114/64 | HR 80 | Ht 72.0 in | Wt 247.2 lb

## 2014-02-12 DIAGNOSIS — G4733 Obstructive sleep apnea (adult) (pediatric): Secondary | ICD-10-CM | POA: Insufficient documentation

## 2014-02-12 DIAGNOSIS — I998 Other disorder of circulatory system: Secondary | ICD-10-CM

## 2014-02-12 DIAGNOSIS — I6529 Occlusion and stenosis of unspecified carotid artery: Secondary | ICD-10-CM | POA: Diagnosis not present

## 2014-02-12 DIAGNOSIS — D689 Coagulation defect, unspecified: Secondary | ICD-10-CM

## 2014-02-12 DIAGNOSIS — E785 Hyperlipidemia, unspecified: Secondary | ICD-10-CM

## 2014-02-12 DIAGNOSIS — R5381 Other malaise: Secondary | ICD-10-CM

## 2014-02-12 DIAGNOSIS — I70229 Atherosclerosis of native arteries of extremities with rest pain, unspecified extremity: Secondary | ICD-10-CM

## 2014-02-12 DIAGNOSIS — Z79899 Other long term (current) drug therapy: Secondary | ICD-10-CM | POA: Diagnosis not present

## 2014-02-12 DIAGNOSIS — I999 Unspecified disorder of circulatory system: Secondary | ICD-10-CM | POA: Diagnosis not present

## 2014-02-12 DIAGNOSIS — R5383 Other fatigue: Secondary | ICD-10-CM | POA: Diagnosis not present

## 2014-02-12 LAB — CBC
HCT: 42 % (ref 39.0–52.0)
Hemoglobin: 14.7 g/dL (ref 13.0–17.0)
MCH: 31.6 pg (ref 26.0–34.0)
MCHC: 35 g/dL (ref 30.0–36.0)
MCV: 90.3 fL (ref 78.0–100.0)
Platelets: 278 10*3/uL (ref 150–400)
RBC: 4.65 MIL/uL (ref 4.22–5.81)
RDW: 14 % (ref 11.5–15.5)
WBC: 8.5 10*3/uL (ref 4.0–10.5)

## 2014-02-12 NOTE — Patient Instructions (Signed)
Dr. Gwenlyn Found has ordered a peripheral angiogram to be done at [facility].  This procedure is going to look at the bloodflow in your lower extremities.  If Dr. Gwenlyn Found is able to open up the arteries, you will have to spend one night in the hospital.  If he is not able to open the arteries, you will be able to go home that same day.    After the procedure, you will not be allowed to drive for 3 days or push, pull, or lift anything greater than 10 lbs for one week.    You will be required to have the following tests prior to the procedure:  1. Blood work-the blood work can be done no more than 7 days prior to the procedure.  It can be done at any [facility] lab.  There is one downstairs on the first floor of this building and one in the [place] ([address])   *REPS Randall Hiss  Dr Gwenlyn Found has also ordered lower extremity arterial doppler (to be done this week)- During this test, ultrasound is used to evaluate arterial blood flow in the legs. Allow approximately one hour for this exam.

## 2014-02-12 NOTE — Assessment & Plan Note (Signed)
Recent left carotid endarterectomy performed by Dr. [name] for asymptomatic high-grade left internal carotid artery stenosis

## 2014-02-12 NOTE — Assessment & Plan Note (Addendum)
Improved on CPAP. He is compliant with his CPAP and is obtaining clinical benefit.

## 2014-02-12 NOTE — Assessment & Plan Note (Signed)
On statin therapy followed by his PCP 

## 2014-02-12 NOTE — Assessment & Plan Note (Signed)
Mr. Heavin has critical limb ischemia with a nonhealing and rapidly progressing wound on his right great toe. This was not present when I saw him last. Dopplers performed an office 07/20/13 revealed an occluded dorsalis pedis with high-grade posterior tibial artery stenosis. We will recheck arterial Doppler studies and arrange for him to undergo telemetry expeditious angiography and intervention for limb salvage

## 2014-02-12 NOTE — Progress Notes (Addendum)
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 He was referred by Dr. Harlow [name] from [facility] for peripheral vasodilation because of nonhealing small ulcers on the dorsal surface of his great toes bilaterally. His cardiovascular risk factor profile is remarkable for treated hypertension, diabetes and hyperlipidemia. He has had a stroke 03/08/12 and has carotid disease followed by Dr. Trula [name] ,. He denies chest pain, shortness of breath or claudication. He has had 2 small ulcers on the dorsal surface of both great toes for 6-9 months which have been slow to heal. Since I saw him last in November he has had an elective left carotid endarterectomy performed by Dr. Trula [name] . He was also placed on CPAP because of obstructive sleep apnea which he is benefiting from. He is compliant with his CPAP.[name] He has developed a nonhealing wound on his right great toe which has been fairly rapidly progressing and he sent back for further evaluation. I performed lower extremity Doppler studies last October suggesting tibial vessel disease..I am going to have him undergo angiography and potential percutaneous intervention for limb salvage    Current Outpatient Prescriptions  Medication Sig Dispense Refill  . amoxicillin-clavulanate (AUGMENTIN) 875-125 MG per tablet Take 1 tablet by mouth 2 (two) times daily.      [name] aspirin EC 81 MG tablet Take 81 mg by mouth daily.      [name] b complex vitamins tablet Take 1 tablet by mouth daily.      . clopidogrel (PLAVIX) 75 MG tablet Take 75 mg by mouth daily.       . Dapagliflozin Propanediol (FARXIGA) 5 MG TABS Take 5 mg by mouth every evening.       .  Empagliflozin (JARDIANCE) 10 MG TABS Take 10 mg by mouth daily.      [name] gabapentin (NEURONTIN) 600 MG tablet Take 600 mg by mouth 4 (four) times daily.       [name] glimepiride (AMARYL) 4 MG tablet Take 4 mg by mouth daily with breakfast.       . levothyroxine (SYNTHROID, LEVOTHROID) 25 MCG tablet Take 25 mcg by mouth daily.       [name] losartan-hydrochlorothiazide (HYZAAR) 100-12.5 MG per tablet Take 1 tablet by mouth daily.      . Magnesium 250 MG TABS Take 1 tablet by mouth daily.      . Melatonin 5 MG TABS Take 2 tablets by mouth at bedtime.      . Misc Natural Products (OSTEO BI-FLEX JOINT SHIELD PO) Take 1 tablet by mouth 2 (two) times daily.      . Multiple Vitamins-Minerals (ONE-A-DAY 50 PLUS PO) Take 1 tablet by mouth daily.      . Omega-3 Fatty Acids (FISH OIL PO) Take 1,200 mg by mouth 2 (two) times daily.      [name] OVER THE COUNTER MEDICATION Take 250 mg by mouth 2 (two) times daily. Milk Thistle      . oxyCODONE (ROXICODONE) 5 MG immediate release tablet Take 1 tablet (5 mg total) by mouth every 6 (six) hours as needed for severe pain.  30 tablet  0  . Pantoprazole Sodium (PROTONIX PO) Take 40 mg by mouth 2 (two) times  daily.      . pravastatin (PRAVACHOL) 40 MG tablet Take 40 mg by mouth every morning.       [name] rOPINIRole (REQUIP) 0.5 MG tablet Take 1 tablet (0.5 mg total) by mouth at bedtime.  30 tablet  11  . sitaGLIPtin (JANUVIA) 100 MG tablet Take 100 mg by mouth every evening.       . testosterone cypionate (DEPOTESTOTERONE CYPIONATE) 200 MG/ML injection Inject 200 mg into the muscle every 7 (seven) days.        No current facility-administered medications for this visit.    Allergies  Allergen Reactions  . Codeine Nausea And Vomiting  . Tramadol Nausea Only    History   Social History  . Marital Status: Married    Spouse Name: N/A    Number of Children: N/A  . Years of Education: N/A   Occupational History  . Not on file.   Social History Main Topics  . Smoking status:  Former Smoker -- 1 years    Types: Pipe  . Smokeless tobacco: Never Used  . Alcohol Use: No  . Drug Use: No  . Sexual Activity: Not on file   Other Topics Concern  . Not on file   Social History Narrative  . No narrative on file     Review of Systems: General: negative for chills, fever, night sweats or weight changes.  Cardiovascular: negative for chest pain, dyspnea on exertion, edema, orthopnea, palpitations, paroxysmal nocturnal dyspnea or shortness of breath Dermatological: negative for rash Respiratory: negative for cough or wheezing Urologic: negative for hematuria Abdominal: negative for nausea, vomiting, diarrhea, bright red blood per rectum, melena, or hematemesis Neurologic: negative for visual changes, syncope, or dizziness All other systems reviewed and are otherwise negative except as noted above.    Blood pressure 114/64, pulse 80, height 6' (1.829 m), weight 247 lb 3.2 oz (112.129 kg).  General appearance: alert and no distress Neck: no adenopathy, no carotid bruit, no JVD, supple, symmetrical, trachea midline and thyroid not enlarged, symmetric, no tenderness/mass/nodules Lungs: clear to auscultation bilaterally Heart: regular rate and rhythm, S1, S2 normal, no murmur, click, rub or gallop Extremities: ischemic ulcer right great toe  EKG not performed today  ASSESSMENT AND PLAN:   Carotid stenosis Recent left carotid endarterectomy performed by Dr. Annamarie [name] for asymptomatic high-grade left internal carotid artery stenosis  Hyperlipidemia On statin therapy followed by his PCP  Obstructive sleep apnea Improved on CPAP  Peripheral arterial disease [name] has critical limb ischemia with a nonhealing and rapidly progressing wound on his right great toe. This was not present when I saw him last. Dopplers performed an office 07/20/13 revealed an occluded dorsalis pedis with high-grade posterior tibial artery stenosis. We will recheck arterial  Doppler studies and arrange for him to undergo telemetry expeditious angiography and intervention for limb salvage      Christopher Harp MD [facility], [facility] 02/12/2014 3:42 PM

## 2014-02-13 ENCOUNTER — Telehealth: Payer: Self-pay | Admitting: *Deleted

## 2014-02-13 ENCOUNTER — Encounter: Payer: Self-pay | Admitting: Cardiovascular Disease

## 2014-02-13 LAB — BASIC METABOLIC PANEL
BUN: 22 mg/dL (ref 6–23)
CO2: 28 mEq/L (ref 19–32)
Calcium: 9.4 mg/dL (ref 8.4–10.5)
Chloride: 100 mEq/L (ref 96–112)
Creat: 1.11 mg/dL (ref 0.50–1.35)
Glucose, Bld: 69 mg/dL — ABNORMAL LOW (ref 70–99)
Potassium: 4 mEq/L (ref 3.5–5.3)
Sodium: 137 mEq/L (ref 135–145)

## 2014-02-13 LAB — PROTIME-INR
INR: 1.02 (ref <1.50)
Prothrombin Time: 13.3 seconds (ref 11.6–15.2)

## 2014-02-13 LAB — APTT: aPTT: 30 seconds (ref 24–37)

## 2014-02-13 LAB — TSH: TSH: 4.054 u[IU]/mL (ref 0.350–4.500)

## 2014-02-13 NOTE — Telephone Encounter (Signed)
Faxed CPAP compliance office note from Dr. Gwenlyn Found  02/12/14 appointment to [facility]. Also spoke with Gwinda Passe and asked for her to fax to me his most recent download. If he does not have one then I requested for her to order one.

## 2014-02-14 ENCOUNTER — Ambulatory Visit (HOSPITAL_COMMUNITY)
Admission: RE | Admit: 2014-02-14 | Discharge: 2014-02-14 | Disposition: A | Discharge status: Home / Self Care | Payer: Medicare Other | Source: Ambulatory Visit | Attending: Cardiovascular Disease | Admitting: Cardiovascular Disease

## 2014-02-14 DIAGNOSIS — I998 Other disorder of circulatory system: Secondary | ICD-10-CM

## 2014-02-14 DIAGNOSIS — I999 Unspecified disorder of circulatory system: Secondary | ICD-10-CM | POA: Diagnosis not present

## 2014-02-14 DIAGNOSIS — I70229 Atherosclerosis of native arteries of extremities with rest pain, unspecified extremity: Secondary | ICD-10-CM

## 2014-02-14 DIAGNOSIS — I739 Peripheral vascular disease, unspecified: Secondary | ICD-10-CM | POA: Insufficient documentation

## 2014-02-14 NOTE — Progress Notes (Signed)
Lower Extremity Arterial Duplex Completed. °Brianna L Mazza,RVT °

## 2014-02-15 ENCOUNTER — Encounter (HOSPITAL_COMMUNITY): Payer: Self-pay | Admitting: Pharmacy Technician

## 2014-02-18 ENCOUNTER — Ambulatory Visit (HOSPITAL_COMMUNITY)
Admission: RE | Admit: 2014-02-18 | Discharge: 2014-02-18 | Disposition: A | Discharge status: Home / Self Care | Payer: Medicare Other | Source: Ambulatory Visit | Attending: Cardiovascular Disease | Admitting: Cardiovascular Disease

## 2014-02-18 ENCOUNTER — Encounter (HOSPITAL_COMMUNITY)
Admission: RE | Disposition: A | Discharge status: Home / Self Care | Payer: Self-pay | Source: Ambulatory Visit | Attending: Cardiovascular Disease

## 2014-02-18 DIAGNOSIS — G4733 Obstructive sleep apnea (adult) (pediatric): Secondary | ICD-10-CM | POA: Insufficient documentation

## 2014-02-18 DIAGNOSIS — I70229 Atherosclerosis of native arteries of extremities with rest pain, unspecified extremity: Secondary | ICD-10-CM

## 2014-02-18 DIAGNOSIS — E785 Hyperlipidemia, unspecified: Secondary | ICD-10-CM | POA: Insufficient documentation

## 2014-02-18 DIAGNOSIS — Z87891 Personal history of nicotine dependence: Secondary | ICD-10-CM | POA: Diagnosis not present

## 2014-02-18 DIAGNOSIS — I739 Peripheral vascular disease, unspecified: Secondary | ICD-10-CM | POA: Insufficient documentation

## 2014-02-18 DIAGNOSIS — Z7902 Long term (current) use of antithrombotics/antiplatelets: Secondary | ICD-10-CM | POA: Insufficient documentation

## 2014-02-18 DIAGNOSIS — Z8673 Personal history of transient ischemic attack (TIA), and cerebral infarction without residual deficits: Secondary | ICD-10-CM | POA: Insufficient documentation

## 2014-02-18 DIAGNOSIS — I10 Essential (primary) hypertension: Secondary | ICD-10-CM | POA: Insufficient documentation

## 2014-02-18 DIAGNOSIS — L98499 Non-pressure chronic ulcer of skin of other sites with unspecified severity: Secondary | ICD-10-CM

## 2014-02-18 DIAGNOSIS — G2581 Restless legs syndrome: Secondary | ICD-10-CM | POA: Insufficient documentation

## 2014-02-18 DIAGNOSIS — E663 Overweight: Secondary | ICD-10-CM | POA: Insufficient documentation

## 2014-02-18 DIAGNOSIS — E119 Type 2 diabetes mellitus without complications: Secondary | ICD-10-CM | POA: Insufficient documentation

## 2014-02-18 DIAGNOSIS — I998 Other disorder of circulatory system: Secondary | ICD-10-CM

## 2014-02-18 DIAGNOSIS — L97509 Non-pressure chronic ulcer of other part of unspecified foot with unspecified severity: Secondary | ICD-10-CM | POA: Insufficient documentation

## 2014-02-18 DIAGNOSIS — Z7982 Long term (current) use of aspirin: Secondary | ICD-10-CM | POA: Insufficient documentation

## 2014-02-18 HISTORY — PX: LOWER EXTREMITY ANGIOGRAM: SHX5508

## 2014-02-18 HISTORY — PX: OTHER SURGICAL HISTORY: SHX169

## 2014-02-18 LAB — GLUCOSE, CAPILLARY
Glucose-Capillary: 94 mg/dL (ref 70–99)
Glucose-Capillary: 71 mg/dL (ref 70–99)
Glucose-Capillary: 121 mg/dL — ABNORMAL HIGH (ref 70–99)

## 2014-02-18 SURGERY — ANGIOGRAM, LOWER EXTREMITY
Anesthesia: LOCAL | Laterality: Bilateral

## 2014-02-18 MED ORDER — MIDAZOLAM HCL 2 MG/2ML IJ SOLN
1.0000 mg | Freq: Once | INTRAMUSCULAR | Status: AC
  Administered 2014-02-18: 1 mg via INTRAVENOUS

## 2014-02-18 MED ORDER — HEPARIN (PORCINE) IN NACL 2-0.9 UNIT/ML-% IJ SOLN
INTRAMUSCULAR | Status: AC
  Filled 2014-02-18: qty 1000

## 2014-02-18 MED ORDER — SODIUM CHLORIDE 0.9 % IJ SOLN: 3.0000 mL | INTRAMUSCULAR | Status: DC | PRN

## 2014-02-18 MED ORDER — MIDAZOLAM HCL 2 MG/2ML IJ SOLN
INTRAMUSCULAR | Status: AC
  Filled 2014-02-18: qty 2

## 2014-02-18 MED ORDER — SODIUM CHLORIDE 0.9 % IV SOLN
INTRAVENOUS | Status: DC
  Administered 2014-02-18: 12:00:00 via INTRAVENOUS

## 2014-02-18 MED ORDER — FENTANYL CITRATE 0.05 MG/ML IJ SOLN
25.0000 ug | INTRAMUSCULAR | Status: DC | PRN
  Administered 2014-02-18: 25 ug via INTRAVENOUS

## 2014-02-18 MED ORDER — HYDRALAZINE HCL 20 MG/ML IJ SOLN
INTRAMUSCULAR | Status: AC
  Filled 2014-02-18: qty 1

## 2014-02-18 MED ORDER — ASPIRIN 81 MG PO CHEW
CHEWABLE_TABLET | ORAL | Status: AC
  Administered 2014-02-18: 81 mg via ORAL
  Filled 2014-02-18: qty 1

## 2014-02-18 MED ORDER — FENTANYL CITRATE 0.05 MG/ML IJ SOLN
INTRAMUSCULAR | Status: AC
  Administered 2014-02-18: 25 ug via INTRAVENOUS
  Filled 2014-02-18: qty 2

## 2014-02-18 MED ORDER — FENTANYL CITRATE 0.05 MG/ML IJ SOLN
INTRAMUSCULAR | Status: AC
  Filled 2014-02-18: qty 2

## 2014-02-18 MED ORDER — LIDOCAINE HCL (PF) 1 % IJ SOLN
INTRAMUSCULAR | Status: AC
  Filled 2014-02-18: qty 30

## 2014-02-18 MED ORDER — GABAPENTIN 600 MG PO TABS
600.0000 mg | ORAL_TABLET | Freq: Once | ORAL | Status: AC
  Administered 2014-02-18: 600 mg via ORAL
  Filled 2014-02-18: qty 1

## 2014-02-18 MED ORDER — HYDRALAZINE HCL 20 MG/ML IJ SOLN
10.0000 mg | INTRAMUSCULAR | Status: DC | PRN
  Administered 2014-02-18: 10 mg via INTRAVENOUS

## 2014-02-18 MED ORDER — ASPIRIN 81 MG PO CHEW
81.0000 mg | CHEWABLE_TABLET | ORAL | Status: AC
  Administered 2014-02-18: 81 mg via ORAL

## 2014-02-18 MED ORDER — SODIUM CHLORIDE 0.9 % IV SOLN: INTRAVENOUS | Status: AC

## 2014-02-18 NOTE — CV Procedure (Signed)
[name] is a 68 y.o. male    [id number] LOCATION:  FACILITY: [facility]  PHYSICIAN: Quay Burow, M.D. 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 He was referred by [name] from [facility] for peripheral vasodilation because of nonhealing small ulcers on the dorsal surface of his great toes bilaterally. His cardiovascular risk factor profile is remarkable for treated hypertension, diabetes and hyperlipidemia. He has had a stroke 03/08/12 and has carotid disease followed by [name] ,. He denies chest pain, shortness of breath or claudication. He has had 2 small ulcers on the dorsal surface of both great toes for 6-9 months which have been slow to heal. Since I saw him last in November he has had an elective left carotid endarterectomy performed by [name] . He was also placed on CPAP because of obstructive sleep apnea which he is benefiting from. He is compliant with his CPAP.[name] He has developed a nonhealing wound on his right great toe which has been fairly rapidly progressing and he sent back for further evaluation. I performed lower extremity Doppler studies last October suggesting tibial vessel disease..I am going to have him undergo angiography and potential percutaneous intervention for limb salvage    PROCEDURE DESCRIPTION:   The patient was brought to the second floor [facility] cath lab in the postabsorptive state. He was  premedicated with Valium 5 mg by mouth, IV Versed and fentanyl. His left groinwas prepped and shaved in usual sterile fashion. Xylocaine 1% was used for local anesthesia. A 5 French sheath was inserted into the left common femoral  artery using  standard Seldinger technique. A 5 French pigtail catheter was placed at the level of distal dominant order. The abdominal aortography and bilateral iliac angiography was then performed. Lateral access was obtained with a endhole catheter and right and left lower extremity angiography runoff was performed sequentially. A long endhole catheter was in place and the above-the-knee popliteal for selective imaging of the tibial vessels. Visipaque dye was used for the entirety of the case. Retrograde aortic pressure was monitored during the case.  HEMODYNAMICS:    AO SYSTOLIC/AO DIASTOLIC: 497/02   Angiographic Data:   1: Abdominal aortogram-the distal abdominal aorta was free of significant disease  2: Left lower extremity-the left anterior tibial is occluded  3: Right lower extremity-the right A-T and PT were diffusely diseased. The anterior tibial is occluded over a long portion. Posterior tibial is diffusely diseased throughout its in its entirety.  IMPRESSION:[name] has critical limb ischemia with a nonhealing ulcer on the dorsal aspect of his right great toe. He has tibial vessel disease on that side involving the anterior tibial and posterior tibial arteries. He does have restless leg syndrome making complex differential procedures difficult because of involuntary movement. He will need diamondback orbital rotational atherectomy plus or minus PTA of his anterior and  posterior posterior tibial artery for limb salvage.the sheath was removed and pressure was held on the groin to achieve hemostasis. The patient left the lab in stable condition. He'll be hydrated, discharged home as an outpatient. I will see him back in the office later this week to arrange for intervention with LMA.    [name]  Christopher Found MD, [facility] 02/18/2014 4:12 PM

## 2014-02-18 NOTE — H&P (View-Only) (Signed)
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 He was referred by [name] from [facility] for peripheral vasodilation because of nonhealing small ulcers on the dorsal surface of his great toes bilaterally. His cardiovascular risk factor profile is remarkable for treated hypertension, diabetes and hyperlipidemia. He has had a stroke 03/08/12 and has carotid disease followed by [name] ,. He denies chest pain, shortness of breath or claudication. He has had 2 small ulcers on the dorsal surface of both great toes for 6-9 months which have been slow to heal. Since I saw him last in November he has had an elective left carotid endarterectomy performed by [name] . He was also placed on CPAP because of obstructive sleep apnea which he is benefiting from. He is compliant with his CPAP.[name] He has developed a nonhealing wound on his right great toe which has been fairly rapidly progressing and he sent back for further evaluation. I performed lower extremity Doppler studies last October suggesting tibial vessel disease..I am going to have him undergo angiography and potential percutaneous intervention for limb salvage    Current Outpatient Prescriptions  Medication Sig Dispense Refill  . amoxicillin-clavulanate (AUGMENTIN) 875-125 MG per tablet Take 1 tablet by mouth 2 (two) times daily.      [name] aspirin EC 81 MG tablet Take 81 mg by mouth daily.      [name] b complex vitamins tablet Take 1 tablet by mouth daily.      . clopidogrel (PLAVIX) 75 MG tablet Take 75 mg by mouth daily.       . Dapagliflozin Propanediol (FARXIGA) 5 MG TABS Take 5 mg by mouth every evening.       .  Empagliflozin (JARDIANCE) 10 MG TABS Take 10 mg by mouth daily.      [name] gabapentin (NEURONTIN) 600 MG tablet Take 600 mg by mouth 4 (four) times daily.       [name] glimepiride (AMARYL) 4 MG tablet Take 4 mg by mouth daily with breakfast.       . levothyroxine (SYNTHROID, LEVOTHROID) 25 MCG tablet Take 25 mcg by mouth daily.       [name] losartan-hydrochlorothiazide (HYZAAR) 100-12.5 MG per tablet Take 1 tablet by mouth daily.      . Magnesium 250 MG TABS Take 1 tablet by mouth daily.      . Melatonin 5 MG TABS Take 2 tablets by mouth at bedtime.      . Misc Natural Products (OSTEO BI-FLEX JOINT SHIELD PO) Take 1 tablet by mouth 2 (two) times daily.      . Multiple Vitamins-Minerals (ONE-A-DAY 50 PLUS PO) Take 1 tablet by mouth daily.      . Omega-3 Fatty Acids (FISH OIL PO) Take 1,200 mg by mouth 2 (two) times daily.      [name] OVER THE COUNTER MEDICATION Take 250 mg by mouth 2 (two) times daily. Milk Thistle      . oxyCODONE (ROXICODONE) 5 MG immediate release tablet Take 1 tablet (5 mg total) by mouth every 6 (six) hours as needed for severe pain.  30 tablet  0  . Pantoprazole Sodium (PROTONIX PO) Take 40 mg by mouth 2 (two) times  daily.      . pravastatin (PRAVACHOL) 40 MG tablet Take 40 mg by mouth every morning.       [name] rOPINIRole (REQUIP) 0.5 MG tablet Take 1 tablet (0.5 mg total) by mouth at bedtime.  30 tablet  11  . sitaGLIPtin (JANUVIA) 100 MG tablet Take 100 mg by mouth every evening.       . testosterone cypionate (DEPOTESTOTERONE CYPIONATE) 200 MG/ML injection Inject 200 mg into the muscle every 7 (seven) days.        No current facility-administered medications for this visit.    Allergies  Allergen Reactions  . Codeine Nausea And Vomiting  . Tramadol Nausea Only    History   Social History  . Marital Status: Married    Spouse Name: N/A    Number of Children: N/A  . Years of Education: N/A   Occupational History  . Not on file.   Social History Main Topics  . Smoking status:  Former Smoker -- 1 years    Types: Pipe  . Smokeless tobacco: Never Used  . Alcohol Use: No  . Drug Use: No  . Sexual Activity: Not on file   Other Topics Concern  . Not on file   Social History Narrative  . No narrative on file     Review of Systems: General: negative for chills, fever, night sweats or weight changes.  Cardiovascular: negative for chest pain, dyspnea on exertion, edema, orthopnea, palpitations, paroxysmal nocturnal dyspnea or shortness of breath Dermatological: negative for rash Respiratory: negative for cough or wheezing Urologic: negative for hematuria Abdominal: negative for nausea, vomiting, diarrhea, bright red blood per rectum, melena, or hematemesis Neurologic: negative for visual changes, syncope, or dizziness All other systems reviewed and are otherwise negative except as noted above.    Blood pressure 114/64, pulse 80, height 6' (1.829 m), weight 247 lb 3.2 oz (112.129 kg).  General appearance: alert and no distress Neck: no adenopathy, no carotid bruit, no JVD, supple, symmetrical, trachea midline and thyroid not enlarged, symmetric, no tenderness/mass/nodules Lungs: clear to auscultation bilaterally Heart: regular rate and rhythm, S1, S2 normal, no murmur, click, rub or gallop Extremities: ischemic ulcer right great toe  EKG not performed today  ASSESSMENT AND PLAN:   Carotid stenosis Recent left carotid endarterectomy performed by Dr. [name] for asymptomatic high-grade left internal carotid artery stenosis  Hyperlipidemia On statin therapy followed by his PCP  Obstructive sleep apnea Improved on CPAP  Peripheral arterial disease Mr. Adinolfi has critical limb ischemia with a nonhealing and rapidly progressing wound on his right great toe. This was not present when I saw him last. Dopplers performed an office 07/20/13 revealed an occluded dorsalis pedis with high-grade posterior tibial artery stenosis. We will recheck arterial  Doppler studies and arrange for him to undergo telemetry expeditious angiography and intervention for limb salvage      Christopher Harp MD [facility], [facility] 02/12/2014 3:42 PM

## 2014-02-18 NOTE — Interval H&P Note (Signed)
History and Physical Interval Note:  02/18/2014 3:26 PM  [name]  has presented today for surgery, with the diagnosis of critical limb ishcemia  The various methods of treatment have been discussed with the patient and family. After consideration of risks, benefits and other options for treatment, the patient has consented to  Procedure(s): LOWER EXTREMITY ANGIOGRAM (N/A) as a surgical intervention .  The patient's history has been reviewed, patient examined, no change in status, stable for surgery.  I have reviewed the patient's chart and labs.  Questions were answered to the patient's satisfaction.     [name]

## 2014-02-18 NOTE — Discharge Instructions (Signed)
Angiography, Care After °Refer to this sheet in the next few weeks. These instructions provide you with information on caring for yourself after your procedure. Your health care provider may also give you more specific instructions. Your treatment has been planned according to current medical practices, but problems sometimes occur. Call your health care provider if you have any problems or questions after your procedure.  °WHAT TO EXPECT AFTER THE PROCEDURE °After your procedure, it is typical to have the following sensations: °· Minor discomfort or tenderness and a small bump at the catheter insertion site. The bump should usually decrease in size and tenderness within 1 to 2 weeks. °· Any bruising will usually fade within 2 to 4 weeks. °HOME CARE INSTRUCTIONS  °· You may need to keep taking blood thinners if they were prescribed for you. Only take over-the-counter or prescription medicines for pain, fever, or discomfort as directed by your health care provider. °· Do not apply powder or lotion to the site. °· Do not sit in a bathtub, swimming pool, or whirlpool for 5 to 7 days. °· You may shower 24 hours after the procedure. Remove the bandage (dressing) and gently wash the site with plain soap and water. Gently pat the site dry. °· Inspect the site at least twice daily. °· Limit your activity for the first 48 hours. Do not bend, squat, or lift anything over 20 lb (9 kg) or as directed by your health care provider. °· Do not drive home if you are discharged the day of the procedure. Have someone else drive you. Follow instructions about when you can drive or return to work. °SEEK MEDICAL CARE IF: °· You get lightheaded when standing up. °· You have drainage (other than a small amount of blood on the dressing). °· You have chills. °· You have a fever. °· You have redness, warmth, swelling, or pain at the insertion site. °SEEK IMMEDIATE MEDICAL CARE IF:  °· You develop chest pain or shortness of breath, feel faint,  or pass out. °· You have bleeding, swelling larger than a walnut, or drainage from the catheter insertion site. °· You develop pain, discoloration, coldness, or severe bruising in the leg or arm that held the catheter. °· You develop bleeding from any other place, such as the bowels. You may see bright red blood in your urine or stools, or your stools may appear black and tarry. °· You have heavy bleeding from the site. If this happens, hold pressure on the site. °MAKE SURE YOU: °· Understand these instructions. °· Will watch your condition. °· Will get help right away if you are not doing well or get worse. °Document Released: 03/25/2005 Document Revised: 05/09/2013 Document Reviewed: 01/29/2013 °ExitCare® Patient Information ©2014 [employer]. ° °

## 2014-02-19 ENCOUNTER — Encounter: Payer: Self-pay | Admitting: Cardiovascular Disease

## 2014-02-19 ENCOUNTER — Telehealth: Payer: Self-pay | Admitting: Cardiovascular Disease

## 2014-02-19 NOTE — Telephone Encounter (Signed)
Spoke with Sherron Ales ([employer]) regarding procedure scheduled for 02/28/14 @ 11:30 am.

## 2014-02-20 NOTE — Telephone Encounter (Signed)
Spoke with [name] regarding procedure scheduled for 02/28/14 @ 11:30 am at [place] patient to arrive 9:30 am NPO after midnight and to plan to spend the night.  I also told him I would mail the instructions to him.  [name] voiced his understanding.

## 2014-02-25 DIAGNOSIS — E1159 Type 2 diabetes mellitus with other circulatory complications: Secondary | ICD-10-CM | POA: Diagnosis not present

## 2014-02-27 ENCOUNTER — Other Ambulatory Visit: Payer: Self-pay | Admitting: *Deleted

## 2014-02-27 DIAGNOSIS — Z01818 Encounter for other preprocedural examination: Secondary | ICD-10-CM

## 2014-02-28 ENCOUNTER — Ambulatory Visit (HOSPITAL_COMMUNITY): Payer: Medicare Other | Admitting: Anesthesiology

## 2014-02-28 ENCOUNTER — Encounter (HOSPITAL_COMMUNITY): Payer: Self-pay | Admitting: Certified Registered Nurse Anesthetist

## 2014-02-28 ENCOUNTER — Encounter (HOSPITAL_COMMUNITY): Payer: Medicare Other | Admitting: Anesthesiology

## 2014-02-28 ENCOUNTER — Ambulatory Visit (HOSPITAL_COMMUNITY)
Admission: RE | Admit: 2014-02-28 | Discharge: 2014-03-01 | Disposition: A | Discharge status: Home / Self Care | Payer: Medicare Other | Source: Ambulatory Visit | Attending: Cardiovascular Disease | Admitting: Cardiovascular Disease

## 2014-02-28 ENCOUNTER — Encounter (HOSPITAL_COMMUNITY)
Admission: RE | Disposition: A | Discharge status: Home / Self Care | Payer: Self-pay | Source: Ambulatory Visit | Attending: Cardiovascular Disease

## 2014-02-28 DIAGNOSIS — I6529 Occlusion and stenosis of unspecified carotid artery: Secondary | ICD-10-CM | POA: Insufficient documentation

## 2014-02-28 DIAGNOSIS — I10 Essential (primary) hypertension: Secondary | ICD-10-CM | POA: Diagnosis not present

## 2014-02-28 DIAGNOSIS — I509 Heart failure, unspecified: Secondary | ICD-10-CM | POA: Diagnosis not present

## 2014-02-28 DIAGNOSIS — I739 Peripheral vascular disease, unspecified: Secondary | ICD-10-CM

## 2014-02-28 DIAGNOSIS — I252 Old myocardial infarction: Secondary | ICD-10-CM | POA: Diagnosis not present

## 2014-02-28 DIAGNOSIS — I7092 Chronic total occlusion of artery of the extremities: Secondary | ICD-10-CM | POA: Insufficient documentation

## 2014-02-28 DIAGNOSIS — E119 Type 2 diabetes mellitus without complications: Secondary | ICD-10-CM | POA: Insufficient documentation

## 2014-02-28 DIAGNOSIS — E785 Hyperlipidemia, unspecified: Secondary | ICD-10-CM | POA: Diagnosis not present

## 2014-02-28 DIAGNOSIS — L98499 Non-pressure chronic ulcer of skin of other sites with unspecified severity: Secondary | ICD-10-CM | POA: Diagnosis not present

## 2014-02-28 DIAGNOSIS — G4733 Obstructive sleep apnea (adult) (pediatric): Secondary | ICD-10-CM | POA: Diagnosis not present

## 2014-02-28 DIAGNOSIS — I998 Other disorder of circulatory system: Secondary | ICD-10-CM

## 2014-02-28 DIAGNOSIS — I70229 Atherosclerosis of native arteries of extremities with rest pain, unspecified extremity: Secondary | ICD-10-CM

## 2014-02-28 DIAGNOSIS — Z87891 Personal history of nicotine dependence: Secondary | ICD-10-CM | POA: Insufficient documentation

## 2014-02-28 DIAGNOSIS — E663 Overweight: Secondary | ICD-10-CM | POA: Insufficient documentation

## 2014-02-28 DIAGNOSIS — Z8673 Personal history of transient ischemic attack (TIA), and cerebral infarction without residual deficits: Secondary | ICD-10-CM | POA: Insufficient documentation

## 2014-02-28 DIAGNOSIS — L97509 Non-pressure chronic ulcer of other part of unspecified foot with unspecified severity: Secondary | ICD-10-CM | POA: Insufficient documentation

## 2014-02-28 DIAGNOSIS — Z01818 Encounter for other preprocedural examination: Secondary | ICD-10-CM

## 2014-02-28 DIAGNOSIS — I429 Cardiomyopathy, unspecified: Secondary | ICD-10-CM

## 2014-02-28 HISTORY — PX: ANGIOPLASTY: SHX39

## 2014-02-28 HISTORY — DX: Atherosclerosis of native arteries of extremities with rest pain, unspecified extremity: I70.229

## 2014-02-28 LAB — POCT ACTIVATED CLOTTING TIME
Activated Clotting Time: 193 seconds
Activated Clotting Time: 227 seconds
Activated Clotting Time: 215 seconds
Activated Clotting Time: 193 seconds
Activated Clotting Time: 163 seconds

## 2014-02-28 LAB — GLUCOSE, CAPILLARY
Glucose-Capillary: 112 mg/dL — ABNORMAL HIGH (ref 70–99)
Glucose-Capillary: 113 mg/dL — ABNORMAL HIGH (ref 70–99)
Glucose-Capillary: 105 mg/dL — ABNORMAL HIGH (ref 70–99)
Glucose-Capillary: 157 mg/dL — ABNORMAL HIGH (ref 70–99)

## 2014-02-28 SURGERY — ATHERECTOMY PERIPHERAL ARTERY

## 2014-02-28 MED ORDER — LOSARTAN POTASSIUM-HCTZ 100-12.5 MG PO TABS: 1.0000 | ORAL_TABLET | Freq: Every day | ORAL | Status: DC

## 2014-02-28 MED ORDER — HYDRALAZINE HCL 20 MG/ML IJ SOLN: 10.0000 mg | INTRAMUSCULAR | Status: DC | PRN

## 2014-02-28 MED ORDER — ONDANSETRON HCL 4 MG/2ML IJ SOLN
INTRAMUSCULAR | Status: DC | PRN
  Administered 2014-02-28: 4 mg via INTRAVENOUS

## 2014-02-28 MED ORDER — MIDAZOLAM HCL 5 MG/5ML IJ SOLN
INTRAMUSCULAR | Status: DC | PRN
  Administered 2014-02-28: 2 mg via INTRAVENOUS

## 2014-02-28 MED ORDER — CLOPIDOGREL BISULFATE 75 MG PO TABS
75.0000 mg | ORAL_TABLET | Freq: Every day | ORAL | Status: DC
  Filled 2014-02-28: qty 1

## 2014-02-28 MED ORDER — HYDROCHLOROTHIAZIDE 12.5 MG PO CAPS
12.5000 mg | ORAL_CAPSULE | Freq: Every day | ORAL | Status: DC
  Administered 2014-02-28 – 2014-03-01 (×2): 12.5 mg via ORAL
  Filled 2014-02-28 (×2): qty 1

## 2014-02-28 MED ORDER — ASPIRIN 81 MG PO CHEW
81.0000 mg | CHEWABLE_TABLET | ORAL | Status: AC
  Administered 2014-02-28: 81 mg via ORAL

## 2014-02-28 MED ORDER — HEPARIN (PORCINE) IN NACL 2-0.9 UNIT/ML-% IJ SOLN
INTRAMUSCULAR | Status: AC
  Filled 2014-02-28: qty 1000

## 2014-02-28 MED ORDER — FENTANYL CITRATE 0.05 MG/ML IJ SOLN
INTRAMUSCULAR | Status: DC | PRN
  Administered 2014-02-28: 50 ug via INTRAVENOUS

## 2014-02-28 MED ORDER — AMOXICILLIN-POT CLAVULANATE 875-125 MG PO TABS
1.0000 | ORAL_TABLET | Freq: Two times a day (BID) | ORAL | Status: DC
  Administered 2014-02-28 – 2014-03-01 (×2): 1 via ORAL
  Filled 2014-02-28 (×3): qty 1

## 2014-02-28 MED ORDER — LINAGLIPTIN 5 MG PO TABS
5.0000 mg | ORAL_TABLET | Freq: Every day | ORAL | Status: DC
  Administered 2014-02-28 – 2014-03-01 (×2): 5 mg via ORAL
  Filled 2014-02-28 (×2): qty 1

## 2014-02-28 MED ORDER — HEPARIN SODIUM (PORCINE) 1000 UNIT/ML IJ SOLN
INTRAMUSCULAR | Status: DC | PRN
  Administered 2014-02-28: 15000 [IU] via INTRAVENOUS

## 2014-02-28 MED ORDER — LIDOCAINE HCL (PF) 1 % IJ SOLN
INTRAMUSCULAR | Status: AC
  Filled 2014-02-28: qty 30

## 2014-02-28 MED ORDER — ASPIRIN EC 81 MG PO TBEC
81.0000 mg | DELAYED_RELEASE_TABLET | Freq: Every day | ORAL | Status: DC
  Filled 2014-02-28: qty 1

## 2014-02-28 MED ORDER — GLYCOPYRROLATE 0.2 MG/ML IJ SOLN
INTRAMUSCULAR | Status: DC | PRN
Start: 2014-02-28 — End: 2014-02-28
  Administered 2014-02-28: 0.2 mg via INTRAVENOUS

## 2014-02-28 MED ORDER — ROPINIROLE HCL 0.5 MG PO TABS
0.5000 mg | ORAL_TABLET | Freq: Every day | ORAL | Status: DC
  Administered 2014-02-28: 0.5 mg via ORAL
  Filled 2014-02-28 (×2): qty 1

## 2014-02-28 MED ORDER — SODIUM CHLORIDE 0.9 % IJ SOLN: 3.0000 mL | INTRAMUSCULAR | Status: DC | PRN

## 2014-02-28 MED ORDER — LOSARTAN POTASSIUM 50 MG PO TABS
100.0000 mg | ORAL_TABLET | Freq: Every day | ORAL | Status: DC
  Administered 2014-02-28 – 2014-03-01 (×2): 100 mg via ORAL
  Filled 2014-02-28 (×2): qty 2

## 2014-02-28 MED ORDER — SODIUM CHLORIDE 0.9 % IV SOLN
INTRAVENOUS | Status: DC
  Administered 2014-02-28 (×3): via INTRAVENOUS

## 2014-02-28 MED ORDER — SODIUM CHLORIDE 0.9 % IV SOLN: INTRAVENOUS | Status: AC

## 2014-02-28 MED ORDER — ZOLPIDEM TARTRATE 5 MG PO TABS
5.0000 mg | ORAL_TABLET | Freq: Every evening | ORAL | Status: DC | PRN
  Administered 2014-02-28: 5 mg via ORAL
  Filled 2014-02-28: qty 1

## 2014-02-28 MED ORDER — HEPARIN SODIUM (PORCINE) 1000 UNIT/ML IJ SOLN
INTRAMUSCULAR | Status: AC
  Filled 2014-02-28: qty 1

## 2014-02-28 MED ORDER — VERAPAMIL HCL 2.5 MG/ML IV SOLN
INTRAVENOUS | Status: AC
  Filled 2014-02-28: qty 2

## 2014-02-28 MED ORDER — EPHEDRINE SULFATE 50 MG/ML IJ SOLN
INTRAMUSCULAR | Status: DC | PRN
  Administered 2014-02-28: 30 mg via INTRAVENOUS

## 2014-02-28 MED ORDER — MORPHINE SULFATE 2 MG/ML IJ SOLN
2.0000 mg | INTRAMUSCULAR | Status: DC | PRN
  Administered 2014-02-28 (×2): 2 mg via INTRAVENOUS
  Filled 2014-02-28 (×2): qty 1

## 2014-02-28 MED ORDER — PANTOPRAZOLE SODIUM 40 MG PO TBEC
40.0000 mg | DELAYED_RELEASE_TABLET | Freq: Two times a day (BID) | ORAL | Status: DC
  Administered 2014-02-28 – 2014-03-01 (×2): 40 mg via ORAL
  Filled 2014-02-28 (×2): qty 1

## 2014-02-28 MED ORDER — PROPOFOL 10 MG/ML IV BOLUS
INTRAVENOUS | Status: DC | PRN
  Administered 2014-02-28: 150 mg via INTRAVENOUS

## 2014-02-28 MED ORDER — SCOPOLAMINE 1 MG/3DAYS TD PT72
MEDICATED_PATCH | TRANSDERMAL | Status: AC
  Administered 2014-02-28: 1 via TRANSDERMAL
  Filled 2014-02-28: qty 1

## 2014-02-28 MED ORDER — LEVOTHYROXINE SODIUM 25 MCG PO TABS
25.0000 ug | ORAL_TABLET | Freq: Every day | ORAL | Status: DC
  Administered 2014-03-01: 25 ug via ORAL
  Filled 2014-02-28 (×2): qty 1

## 2014-02-28 MED ORDER — LIDOCAINE HCL (CARDIAC) 20 MG/ML IV SOLN
INTRAVENOUS | Status: DC | PRN
  Administered 2014-02-28: 80 mg via INTRAVENOUS

## 2014-02-28 MED ORDER — ASPIRIN 81 MG PO CHEW
CHEWABLE_TABLET | ORAL | Status: AC
  Administered 2014-02-28: 81 mg via ORAL
  Filled 2014-02-28: qty 1

## 2014-02-28 MED ORDER — NITROGLYCERIN 0.2 MG/ML ON CALL CATH LAB
INTRAVENOUS | Status: AC
  Filled 2014-02-28: qty 1

## 2014-02-28 MED ORDER — ASPIRIN EC 325 MG PO TBEC
325.0000 mg | DELAYED_RELEASE_TABLET | Freq: Every day | ORAL | Status: DC
  Administered 2014-03-01: 325 mg via ORAL

## 2014-02-28 MED ORDER — GABAPENTIN 600 MG PO TABS
600.0000 mg | ORAL_TABLET | Freq: Four times a day (QID) | ORAL | Status: DC
  Administered 2014-02-28 – 2014-03-01 (×3): 600 mg via ORAL
  Filled 2014-02-28 (×6): qty 1

## 2014-02-28 MED ORDER — OXYCODONE HCL 5 MG PO TABS
5.0000 mg | ORAL_TABLET | Freq: Four times a day (QID) | ORAL | Status: DC | PRN
  Administered 2014-02-28: 5 mg via ORAL
  Filled 2014-02-28 (×5): qty 1

## 2014-02-28 MED ORDER — CLOPIDOGREL BISULFATE 75 MG PO TABS
75.0000 mg | ORAL_TABLET | Freq: Every day | ORAL | Status: DC
  Administered 2014-03-01: 75 mg via ORAL

## 2014-02-28 MED ORDER — GLIMEPIRIDE 4 MG PO TABS
4.0000 mg | ORAL_TABLET | Freq: Every day | ORAL | Status: DC
  Administered 2014-03-01: 4 mg via ORAL
  Filled 2014-02-28 (×2): qty 1

## 2014-02-28 NOTE — Anesthesia Procedure Notes (Signed)
Procedure Name: LMA Insertion Date/Time: 02/28/2014 12:55 PM Performed by: Ned Grace Pre-anesthesia Checklist: Patient identified, Timeout performed, Emergency Drugs available, Suction available and Patient being monitored Patient Re-evaluated:Patient Re-evaluated prior to inductionOxygen Delivery Method: Circle system utilized Preoxygenation: Pre-oxygenation with 100% oxygen Intubation Type: IV induction Ventilation: Mask ventilation without difficulty LMA: LMA inserted LMA Size: 4.0 Number of attempts: 1 Placement Confirmation: positive ETCO2 and breath sounds checked- equal and bilateral Tube secured with: Tape Dental Injury: Teeth and Oropharynx as per pre-operative assessment  Comments: LMA #4 Supreme

## 2014-02-28 NOTE — Anesthesia Preprocedure Evaluation (Signed)
Anesthesia Evaluation  Patient identified by MRN, date of birth, ID band Patient awake    Reviewed: Allergy & Precautions, H&P , NPO status , Patient's Chart, lab work & pertinent test results  History of Anesthesia Complications (+) PONV  Airway Mallampati: III TM Distance: >3 FB Neck ROM: Full    Dental  (+) Lower Dentures, Upper Dentures, Dental Advisory Given   Pulmonary shortness of breath and with exertion, sleep apnea and Continuous Positive Airway Pressure Ventilation , former smoker,  breath sounds clear to auscultation        Cardiovascular hypertension, Pt. on medications - angina+ Peripheral Vascular Disease - CAD, - Past MI, - CHF and - DOE Rhythm:Regular Rate:Normal     Neuro/Psych  Neuromuscular disease CVA, No Residual Symptoms negative psych ROS   GI/Hepatic negative GI ROS, Neg liver ROS,   Endo/Other  diabetes, Type 2, Oral Hypoglycemic AgentsHypothyroidism   Renal/GU negative Renal ROS     Musculoskeletal   Abdominal   Peds  Hematology negative hematology ROS (+)   Anesthesia Other Findings   Reproductive/Obstetrics                           Anesthesia Physical  Anesthesia Plan  ASA: III  Anesthesia Plan: General   Post-op Pain Management:    Induction: Intravenous  Airway Management Planned: LMA  Additional Equipment:   Intra-op Plan:   Post-operative Plan: Extubation in OR  Informed Consent: I have reviewed the patients History and Physical, chart, labs and discussed the procedure including the risks, benefits and alternatives for the proposed anesthesia with the patient or authorized representative who has indicated his/her understanding and acceptance.   Dental advisory given  Plan Discussed with: CRNA, Surgeon and Anesthesiologist  Anesthesia Plan Comments:         Anesthesia Quick Evaluation

## 2014-02-28 NOTE — Anesthesia Postprocedure Evaluation (Signed)
  Anesthesia Post-op Note  Patient: [name]  Procedure(s) Performed: Procedure(s) with comments: ATHERECTOMY PERIPHERAL ARTERY (N/A) - RPT  PTA PERIPHERAL ARTERY (N/A) - RPT  Patient Location: PACU  Anesthesia Type:General  Level of Consciousness: awake  Airway and Oxygen Therapy: Patient Spontanous Breathing  Post-op Pain: mild  Post-op Assessment: Post-op Vital signs reviewed  Post-op Vital Signs: Reviewed  Last Vitals:  Filed Vitals:   02/28/14 1545  BP: 147/65  Pulse: 70  Temp: 36.3 C  Resp: 23    Complications: No apparent anesthesia complications

## 2014-02-28 NOTE — Transfer of Care (Signed)
Immediate Anesthesia Transfer of Care Note  Patient: [name]  Procedure(s) Performed: Procedure(s) with comments: ATHERECTOMY PERIPHERAL ARTERY (N/A) - RPT  PTA PERIPHERAL ARTERY (N/A) - RPT  Patient Location: PACU  Anesthesia Type:General  Level of Consciousness: awake, alert , oriented and patient cooperative  Airway & Oxygen Therapy: Patient Spontanous Breathing and Patient connected to nasal cannula oxygen  Post-op Assessment: Report given to PACU RN, Post -op Vital signs reviewed and stable and Patient moving all extremities  Post vital signs: Reviewed and stable  Complications: No apparent anesthesia complications

## 2014-02-28 NOTE — CV Procedure (Signed)
[name] is a 68 y.o. male    [id number] LOCATION:  FACILITY: [facility]  PHYSICIAN: Quay Burow, M.D. 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 He now works at [employer]. He was referred by Dr. [name] from [facility] for peripheral vasodilation because of nonhealing small ulcers on the dorsal surface of his great toes bilaterally. His cardiovascular risk factor profile is remarkable for treated hypertension, diabetes and hyperlipidemia. He has had a stroke 03/08/12 and has carotid disease followed by Dr. [name] ,. He denies chest pain, shortness of breath or claudication. He has had 2 small ulcers on the dorsal surface of both great toes for 6-9 months which have been slow to heal. Since I saw him last in November he has had an elective left carotid endarterectomy performed by Dr. [name] . He was also placed on CPAP because of obstructive sleep apnea which he is benefiting from. He is compliant with his CPAP.[name] He has developed a nonhealing wound on his right great toe which has been fairly rapidly progressing and he sent back for further evaluation. I performed lower extremity Doppler studies last October suggesting tibial vessel disease.approximately one week ago he underwent angiography revealing severe triple-vessel disease in both his anterior and posterior tibial however, he aggressively syndrome and I was unable to safely perform percutaneous revascularization for critical limb ischemia. He returns today to undergo this procedure under general anesthesia.   PROCEDURE DESCRIPTION:   The patient was brought to the second floor [facility] cath lab in the postabsorptive state. He was placed under general  anesthesia with protective airway. His left groinwas prepped and shaved in usual sterile fashion. Xylocaine 1% was used for local anesthesia. A 7 French sheath was inserted into the left common femoral  artery using standard Seldinger technique. Contralateral access obtained with a 5 [place] crossover catheter and 0.35 Versicore wire. I placed a 7 French/55 cm antral sheath over the bifurcation to the level of the right common femoral artery.  HEMODYNAMICS:    AO SYSTOLIC/AO DIASTOLIC: 768/11   Angiographic Data:   The patient received 15 patches of heparin intravenously with an ACT of 227. Total contrast used during the case was 104 cc. I was able to take a 0.14/300 cm long [place] core wire through a 0.18 cm quick  cross and catheter down the posterior tibial artery to the level of the ankle. I then exchanged this for a by prior and performed orbital rotation arthrectomy, PTA of his posterior tibial artery. He is a 1.25 mm mini crown up to 120,000 rpm's as well as a 2.5 mm x 120 mm long chocolate balloon.. The final intervention result with reduction of long 90% segmental diffuse posterior tibial stenosis less than 20-30% residual with excellent flow to the foot.  IMPRESSION:successful diamondback orbital rotation arthrectomy, TPA using shockable and of a diffusely diseased posterior tibial artery in the setting of critical limb ischemia and a nonhealing right great toe ulcer. His right anterior tibial was 100% occluded and filled by collaterals from the posterior tibial artery and perineal artery. I suspect his we will have a better chance of healing well. The sheath was withdrawn across the bifurcation and exchanged over a 0.25 cm wire for a short  7 French sheath. The patient was on dual antiplatelet therapy.    [name] MD, [facility] 02/28/2014 6:22 PM

## 2014-03-01 ENCOUNTER — Other Ambulatory Visit: Payer: Self-pay | Admitting: Physician Assistant

## 2014-03-01 DIAGNOSIS — Z01818 Encounter for other preprocedural examination: Secondary | ICD-10-CM | POA: Diagnosis not present

## 2014-03-01 DIAGNOSIS — I7092 Chronic total occlusion of artery of the extremities: Secondary | ICD-10-CM | POA: Diagnosis not present

## 2014-03-01 DIAGNOSIS — I739 Peripheral vascular disease, unspecified: Secondary | ICD-10-CM

## 2014-03-01 DIAGNOSIS — E663 Overweight: Secondary | ICD-10-CM | POA: Diagnosis not present

## 2014-03-01 DIAGNOSIS — I428 Other cardiomyopathies: Secondary | ICD-10-CM | POA: Diagnosis not present

## 2014-03-01 DIAGNOSIS — E119 Type 2 diabetes mellitus without complications: Secondary | ICD-10-CM | POA: Diagnosis not present

## 2014-03-01 DIAGNOSIS — L98499 Non-pressure chronic ulcer of skin of other sites with unspecified severity: Secondary | ICD-10-CM | POA: Diagnosis not present

## 2014-03-01 DIAGNOSIS — I999 Unspecified disorder of circulatory system: Secondary | ICD-10-CM

## 2014-03-01 DIAGNOSIS — L97509 Non-pressure chronic ulcer of other part of unspecified foot with unspecified severity: Secondary | ICD-10-CM | POA: Diagnosis not present

## 2014-03-01 DIAGNOSIS — I10 Essential (primary) hypertension: Secondary | ICD-10-CM | POA: Diagnosis not present

## 2014-03-01 LAB — GLUCOSE, CAPILLARY: Glucose-Capillary: 121 mg/dL — ABNORMAL HIGH (ref 70–99)

## 2014-03-01 LAB — CBC
HCT: 34.4 % — ABNORMAL LOW (ref 39.0–52.0)
Hemoglobin: 11.3 g/dL — ABNORMAL LOW (ref 13.0–17.0)
MCH: 31 pg (ref 26.0–34.0)
MCHC: 32.8 g/dL (ref 30.0–36.0)
MCV: 94.5 fL (ref 78.0–100.0)
Platelets: 237 10*3/uL (ref 150–400)
RBC: 3.64 MIL/uL — ABNORMAL LOW (ref 4.22–5.81)
RDW: 14 % (ref 11.5–15.5)
WBC: 7.1 10*3/uL (ref 4.0–10.5)

## 2014-03-01 LAB — BASIC METABOLIC PANEL
BUN: 12 mg/dL (ref 6–23)
CO2: 25 mEq/L (ref 19–32)
Calcium: 8.4 mg/dL (ref 8.4–10.5)
Chloride: 101 mEq/L (ref 96–112)
Creatinine, Ser: 0.87 mg/dL (ref 0.50–1.35)
GFR calc Af Amer: >90 mL/min (ref >90)
GFR calc non Af Amer: 85 mL/min — ABNORMAL LOW (ref >90)
Glucose, Bld: 157 mg/dL — ABNORMAL HIGH (ref 70–99)
Potassium: 3.9 mEq/L (ref 3.7–5.3)
Sodium: 139 mEq/L (ref 137–147)

## 2014-03-01 MED ORDER — ASPIRIN 325 MG PO TBEC: 325.0000 mg | DELAYED_RELEASE_TABLET | Freq: Every day | ORAL | Status: DC

## 2014-03-01 NOTE — Progress Notes (Signed)
Subjective:  Feels great. Restful night.  Objective:  Temp:  [97.3 F (36.3 C)-98.2 F (36.8 C)] 98.1 F (36.7 C) (06/12 0700) Pulse Rate:  [58-77] 68 (06/12 0700) Resp:  [10-23] 21 (06/12 0700) BP: (111-171)/(51-128) 131/62 mmHg (06/12 0700) SpO2:  [92 %-100 %] 94 % (06/12 0700) Weight:  [214 lb 11.7 oz (97.4 kg)-240 lb (108.863 kg)] 214 lb 11.7 oz (97.4 kg) (06/11 0929) Weight change:   Intake/Output from previous day: 06/11 0701 - 06/12 0700 In: 2281.3 [P.O.:400; I.V.:1881.3] Out: 1550 [Urine:1550]  Intake/Output from this shift:    Physical Exam: General appearance: alert and no distress Neck: no adenopathy, no carotid bruit, no JVD, supple, symmetrical, trachea midline and thyroid not enlarged, symmetric, no tenderness/mass/nodules Lungs: clear to auscultation bilaterally Heart: regular rate and rhythm, S1, S2 normal, no murmur, click, rub or gallop Extremities: Left groin OK. 2+ Right PT pulse.  Lab Results: Results for orders placed during the hospital encounter of 02/28/14 (from the past 48 hour(s))  GLUCOSE, CAPILLARY     Status: Abnormal   Collection Time    02/28/14  9:36 AM      Result Value Ref Range   Glucose-Capillary 112 (*) 70 - 99 mg/dL  POCT ACTIVATED CLOTTING TIME     Status: None   Collection Time    02/28/14  1:45 PM      Result Value Ref Range   Activated Clotting Time 193    POCT ACTIVATED CLOTTING TIME     Status: None   Collection Time    02/28/14  1:58 PM      Result Value Ref Range   Activated Clotting Time 227    POCT ACTIVATED CLOTTING TIME     Status: None   Collection Time    02/28/14  2:44 PM      Result Value Ref Range   Activated Clotting Time 215    GLUCOSE, CAPILLARY     Status: Abnormal   Collection Time    02/28/14  3:16 PM      Result Value Ref Range   Glucose-Capillary 113 (*) 70 - 99 mg/dL  POCT ACTIVATED CLOTTING TIME     Status: None   Collection Time    02/28/14  3:48 PM      Result Value Ref Range   Activated Clotting Time 193    POCT ACTIVATED CLOTTING TIME     Status: None   Collection Time    02/28/14  4:51 PM      Result Value Ref Range   Activated Clotting Time 163    GLUCOSE, CAPILLARY     Status: Abnormal   Collection Time    02/28/14  5:20 PM      Result Value Ref Range   Glucose-Capillary 105 (*) 70 - 99 mg/dL  GLUCOSE, CAPILLARY     Status: Abnormal   Collection Time    02/28/14  9:19 PM      Result Value Ref Range   Glucose-Capillary 157 (*) 70 - 99 mg/dL  CBC     Status: Abnormal   Collection Time    03/01/14  3:18 AM      Result Value Ref Range   WBC 7.1  4.0 - 10.5 K/uL   RBC 3.64 (*) 4.22 - 5.81 MIL/uL   Hemoglobin 11.3 (*) 13.0 - 17.0 g/dL   HCT 34.4 (*) 39.0 - 52.0 %   MCV 94.5  78.0 - 100.0 fL   MCH 31.0  26.0 -  34.0 pg   MCHC 32.8  30.0 - 36.0 g/dL   RDW 14.0  11.5 - 15.5 %   Platelets 237  150 - 400 K/uL  BASIC METABOLIC PANEL     Status: Abnormal   Collection Time    03/01/14  3:18 AM      Result Value Ref Range   Sodium 139  137 - 147 mEq/L   Potassium 3.9  3.7 - 5.3 mEq/L   Chloride 101  96 - 112 mEq/L   CO2 25  19 - 32 mEq/L   Glucose, Bld 157 (*) 70 - 99 mg/dL   BUN 12  6 - 23 mg/dL   Creatinine, Ser 0.87  0.50 - 1.35 mg/dL   Calcium 8.4  8.4 - 10.5 mg/dL   GFR calc non Af Amer 85 (*) >90 mL/min   GFR calc Af Amer >90  >90 mL/min   Comment: (NOTE)     The eGFR has been calculated using the CKD EPI equation.     This calculation has not been validated in all clinical situations.     eGFR's persistently <90 mL/min signify possible Chronic Kidney     Disease.    Imaging: Imaging results have been reviewed  Assessment/Plan:   1. Active Problems: 2.   Critical lower limb ischemia 3.   Time Spent Directly with Patient:  20 minutes  Length of Stay:  LOS: 1 day   S/P Right posterior tibial Diamondback orbital rotational atherectomy , Chocolate balloon PTA for CLI (gangranous right great toe). Done under general anesthesia. Left  groin OK. 2+ right PT pulse. Labs OK. D/C home on DAPT. LEA at [facility] next week then ROV with me (it can be with a MLP on a day that I'm in the office) the following week.   [name] 03/01/2014, 8:29 AM

## 2014-03-01 NOTE — Discharge Summary (Signed)
Physician Discharge Summary     Patient ID: [name] MRN: [id number] DOB/AGE: 1945-09-23 68 y.o.  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 He now works at [employer]. He was referred by Dr. [name] from [facility] for peripheral vasodilation because of nonhealing small ulcers on the dorsal surface of his great toes bilaterally. His cardiovascular risk factor profile is remarkable for treated hypertension, diabetes and hyperlipidemia. He has had a stroke 03/08/12 and has carotid disease followed by Dr. [name] ,. He denies chest pain, shortness of breath or claudication. He has had 2 small ulcers on the dorsal surface of both great toes for 6-9 months which have been slow to heal. Since Dr. Gwenlyn Found saw him last in November he has had an elective left carotid endarterectomy performed by Dr. [name] . He was also placed on CPAP because of obstructive sleep apnea which he is benefiting from. He is compliant with his CPAP.  He has developed a nonhealing wound on his right great toe which has been fairly rapidly progressing and he sent back for further evaluation. Dr. Gwenlyn Found performed lower extremity Doppler studies last October suggesting tibial vessel disease.   The patient was admitted for PV angiogram and underwent successful diamondback orbital rotation arthrectomy, PTA and stenting of a diffusely diseased posterior tibial artery in the setting of critical limb ischemia and a nonhealing right great toe ulcer. His right anterior tibial was 100% occluded and filled by collaterals from the posterior tibial artery and perineal  artery.  He will be discharge on ASA and plavix.  Follow up LEA dopplers the week of June 22.  The patient was seen by Dr. Gwenlyn Found who felt he was stable for DC home.      Consults: None  Significant Diagnostic Studies:   PROCEDURE DESCRIPTION:  The patient was brought to the second floor [facility] cath lab in the postabsorptive state. He was placed under general anesthesia with protective airway. His left groinwas prepped and shaved in usual sterile fashion. Xylocaine 1% was used for local anesthesia. A 7 French sheath was inserted into the left common femoral  artery using standard Seldinger technique. Contralateral access obtained with a 5 [place] crossover catheter and 0.35 Versicore wire. I placed a 7 French/55 cm antral sheath over the bifurcation to the level of the right common femoral artery.  HEMODYNAMICS:  AO SYSTOLIC/AO DIASTOLIC: 637/85  Angiographic Data:  The patient received 15 patches of heparin intravenously with an ACT of 227. Total contrast used during the case was 104 cc. I was able to take a 0.14/300 cm long [place] core wire through a 0.18 cm quick cross and catheter down the posterior tibial artery to the level of the ankle. I then exchanged this for a by prior and performed orbital rotation arthrectomy, PTA of his posterior tibial artery. He is a 1.25 mm mini crown up to 120,000 rpm's as well as a 2.5 mm x 120 mm long chocolate balloon.. The final intervention result with reduction of long 90% segmental diffuse posterior tibial stenosis less than 20-30% residual with excellent flow to the foot.  IMPRESSION:successful diamondback orbital rotation arthrectomy, TPA using shockable and  of a diffusely diseased posterior tibial artery in the setting of critical limb ischemia and a nonhealing right great toe ulcer. His right anterior tibial was 100% occluded and filled by collaterals from the posterior tibial artery and perineal artery. I suspect his we will have a better chance  of healing well. The sheath was withdrawn across the bifurcation and exchanged over a 0.25 cm wire for a short 7 [place] sheath. The patient was on dual antiplatelet therapy.  [name] MD, [facility]  02/28/2014  6:22 PM  Treatments: See above  Discharge Exam: Blood pressure 131/62, pulse 68, temperature 98.1 F (36.7 C), temperature source Oral, resp. rate 21, height 6' (1.829 m), weight 214 lb 11.7 oz (97.4 kg), SpO2 94.00%.   Disposition: 01-Home or Self Care      Discharge Instructions   Diet - low sodium heart healthy    Complete by:  As directed      Discharge instructions    Complete by:  As directed   No lifting more than a half gallon of milk or driving for three days.     Increase activity slowly    Complete by:  As directed             Medication List         amoxicillin-clavulanate 875-125 MG per tablet  Commonly known as:  AUGMENTIN  Take 1 tablet by mouth 2 (two) times daily.     aspirin 325 MG EC tablet  Take 1 tablet (325 mg total) by mouth daily.     b complex vitamins tablet  Take 1 tablet by mouth daily.     clopidogrel 75 MG tablet  Commonly known as:  PLAVIX  Take 75 mg by mouth daily.     FARXIGA 5 MG Tabs  Generic drug:  Dapagliflozin Propanediol  Take 5 mg by mouth every evening.     FISH OIL PO  Take 1,200 mg by mouth 2 (two) times daily.     gabapentin 600 MG tablet  Commonly known as:  NEURONTIN  Take 600 mg by mouth 4 (four) times daily.     glimepiride 4 MG tablet  Commonly known as:  AMARYL  Take 4 mg by mouth daily with breakfast.     JARDIANCE 10 MG Tabs  Generic drug:  Empagliflozin  Take 10 mg by mouth daily.     levothyroxine 25 MCG tablet  Commonly known as:  SYNTHROID, LEVOTHROID  Take 25 mcg by mouth daily.     losartan-hydrochlorothiazide 100-12.5 MG per tablet  Commonly known as:  HYZAAR  Take 1 tablet by mouth daily.     Magnesium 250 MG Tabs  Take 250 mg by mouth daily.     Melatonin 5 MG Tabs  Take  10 mg by mouth at bedtime.     Milk Thistle 250 MG Caps  Take 250 mg by mouth 2 (two) times daily.     ONE-A-DAY 50 PLUS PO  Take 1 tablet by mouth daily.     OSTEO BI-FLEX JOINT SHIELD PO  Take 1 tablet by mouth 2 (two) times daily.     oxyCODONE 5 MG immediate release tablet  Commonly known as:  ROXICODONE  Take 1 tablet (5 mg total) by mouth every 6 (six) hours as needed for severe pain.     pantoprazole 40 MG tablet  Commonly known as:  PROTONIX  Take 40 mg by mouth 2 (two) times daily.     pravastatin 40 MG  tablet  Commonly known as:  PRAVACHOL  Take 40 mg by mouth every morning.     rOPINIRole 0.5 MG tablet  Commonly known as:  REQUIP  Take 1 tablet (0.5 mg total) by mouth at bedtime.     sitaGLIPtin 100 MG tablet  Commonly known as:  JANUVIA  Take 100 mg by mouth every evening.     testosterone cypionate 200 MG/ML injection  Commonly known as:  DEPOTESTOTERONE CYPIONATE  Inject 200 mg into the muscle every 7 (seven) days.       Follow-up Information   Follow up with [facility] R, NP On 03/19/2014. (10:00 AM)    Specialty:  Cardiology   Contact information:   [address] [place] [address] [phone]      Greater than 30 minutes was spent completing the patient's discharge.   SignedTarri Fuller, [place] 03/01/2014, 10:50 AM

## 2014-03-06 ENCOUNTER — Telehealth (HOSPITAL_COMMUNITY): Payer: Self-pay | Admitting: *Deleted

## 2014-03-06 DIAGNOSIS — M79609 Pain in unspecified limb: Secondary | ICD-10-CM | POA: Diagnosis not present

## 2014-03-06 DIAGNOSIS — L97509 Non-pressure chronic ulcer of other part of unspecified foot with unspecified severity: Secondary | ICD-10-CM | POA: Diagnosis not present

## 2014-03-06 DIAGNOSIS — B351 Tinea unguium: Secondary | ICD-10-CM | POA: Diagnosis not present

## 2014-03-13 DIAGNOSIS — L97509 Non-pressure chronic ulcer of other part of unspecified foot with unspecified severity: Secondary | ICD-10-CM | POA: Diagnosis not present

## 2014-03-14 ENCOUNTER — Ambulatory Visit (HOSPITAL_COMMUNITY)
Admission: RE | Admit: 2014-03-14 | Discharge: 2014-03-14 | Disposition: A | Discharge status: Home / Self Care | Payer: Medicare Other | Source: Ambulatory Visit | Attending: Cardiology | Admitting: Cardiology

## 2014-03-14 DIAGNOSIS — I739 Peripheral vascular disease, unspecified: Secondary | ICD-10-CM | POA: Insufficient documentation

## 2014-03-14 HISTORY — PX: OTHER SURGICAL HISTORY: SHX169

## 2014-03-14 NOTE — Progress Notes (Signed)
Right Lower Extremity Arterial Duplex Completed. °Brianna L Mazza,RVT °

## 2014-03-19 ENCOUNTER — Encounter: Payer: Self-pay | Admitting: Cardiology

## 2014-03-19 ENCOUNTER — Ambulatory Visit (INDEPENDENT_AMBULATORY_CARE_PROVIDER_SITE_OTHER): Payer: Medicare Other | Admitting: Cardiology

## 2014-03-19 VITALS — BP 119/63 | HR 80 | Ht 72.0 in | Wt 247.0 lb

## 2014-03-19 DIAGNOSIS — I10 Essential (primary) hypertension: Secondary | ICD-10-CM

## 2014-03-19 DIAGNOSIS — G4733 Obstructive sleep apnea (adult) (pediatric): Secondary | ICD-10-CM | POA: Diagnosis not present

## 2014-03-19 DIAGNOSIS — I6529 Occlusion and stenosis of unspecified carotid artery: Secondary | ICD-10-CM | POA: Diagnosis not present

## 2014-03-19 DIAGNOSIS — I739 Peripheral vascular disease, unspecified: Secondary | ICD-10-CM

## 2014-03-19 NOTE — Assessment & Plan Note (Signed)
Controlled.  

## 2014-03-19 NOTE — Assessment & Plan Note (Signed)
Patient with 100% right tibial disease undergoing diamondback orbital rotational atherectomy, PTA and stenting of a diffusely diseased posterior tibial artery in the setting of critical limb ischemia and a nonhealing right great toe ulcer.  Now with improved healing of the toe and improve Dopplers.

## 2014-03-19 NOTE — Patient Instructions (Signed)
1. Your physician recommends that you schedule a follow-up appointment in: 3 months with Dr. Gwenlyn Found  2. Continue your same medications  3. Call and make an appt. If you develop any problems or complications

## 2014-03-19 NOTE — Assessment & Plan Note (Signed)
Now with CPAP

## 2014-03-19 NOTE — Progress Notes (Signed)
03/19/2014   PCP: [name], MD   Chief Complaint  Patient presents with  . Follow-up    S/P PV angio    Primary Cardiologist:Dr. 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 He was referred by Dr. [name] from [facility] for peripheral vasodilation because of nonhealing small ulcers on the dorsal surface of his great toes bilaterally. His cardiovascular risk factor profile is remarkable for treated hypertension, diabetes and hyperlipidemia. He has had a stroke 03/08/12 and has carotid disease followed by Dr. [name] ,. He denied chest pain, shortness of breath or claudication. He has had 2 small ulcers on the dorsal surface of both great toes for 6-9 months which have been slow to heal. Since Dr. Gwenlyn Found saw him last in November he has had an elective left carotid endarterectomy performed by Dr. [name] . He was also placed on CPAP because of obstructive sleep apnea which he is benefiting from. He is compliant with his CPAP. He has developed a nonhealing wound on his right great toe which has been fairly rapidly progressing and he sent back for further evaluation. Dr. Gwenlyn Found performed lower extremity Doppler studies last October suggesting tibial vessel disease.    Patient had PV angiogram 02/18/2014 and found to have bilateral 100% occluded tibial disease.  But with nonhealing wound on the right great toe and need for improved circulation patient underwent underwent successful diamondback orbital rotation arthrectomy, PTA and stenting of a diffusely diseased posterior tibial artery in the setting of critical limb ischemia and a nonhealing right great toe ulcer. His right anterior tibial was 100% occluded and filled by collaterals from the posterior tibial artery and perineal artery.  Patient is back today for  followup. His toe is much improved, no eschar tissue is noted one small area of ulcer on the lateral aspect.  Patient's complaint today is a cough he is concerned it may be related to we'll start him but he prefers Dr. Luan Pulling to address this as he placed him on the losartan.   Allergies  Allergen Reactions  . Codeine Nausea And Vomiting  . Tramadol Nausea Only    Current Outpatient Prescriptions  Medication Sig Dispense Refill  . aspirin EC 325 MG EC tablet Take 1 tablet (325 mg total) by mouth daily.  30 tablet  0  . b complex vitamins tablet Take 1 tablet by mouth daily.      . clopidogrel (PLAVIX) 75 MG tablet Take 75 mg by mouth daily.       . Dapagliflozin Propanediol (FARXIGA) 5 MG TABS Take 5 mg by mouth every evening.       . Empagliflozin (JARDIANCE) 10 MG TABS Take 10 mg by mouth daily.      [name] gabapentin (NEURONTIN) 600 MG tablet Take 600 mg by mouth 4 (four) times daily.       [name] glimepiride (AMARYL) 4 MG tablet Take 4 mg by mouth daily with breakfast.       . levothyroxine (SYNTHROID, LEVOTHROID) 25 MCG tablet Take 25 mcg by mouth daily.       [name] losartan-hydrochlorothiazide (HYZAAR) 100-12.5 MG per tablet Take 1 tablet by mouth daily.      . Magnesium 250 MG TABS Take 250 mg by mouth daily.       . Melatonin 5  MG TABS Take 10 mg by mouth at bedtime.       . Milk Thistle 250 MG CAPS Take 250 mg by mouth 2 (two) times daily.      . Misc Natural Products (OSTEO BI-FLEX JOINT SHIELD PO) Take 1 tablet by mouth 2 (two) times daily.      . Multiple Vitamins-Minerals (ONE-A-DAY 50 PLUS PO) Take 1 tablet by mouth daily.      . Omega-3 Fatty Acids (FISH OIL PO) Take 1,200 mg by mouth 2 (two) times daily.      [name] oxyCODONE (ROXICODONE) 5 MG immediate release tablet Take 1 tablet (5 mg total) by mouth every 6 (six) hours as needed for severe pain.  30 tablet  0  . pantoprazole (PROTONIX) 40 MG tablet Take 40 mg by mouth 2 (two) times daily.      . pravastatin (PRAVACHOL) 40 MG tablet Take  40 mg by mouth every morning.       [name] rOPINIRole (REQUIP) 0.5 MG tablet Take 1 tablet (0.5 mg total) by mouth at bedtime.  30 tablet  11  . sitaGLIPtin (JANUVIA) 100 MG tablet Take 100 mg by mouth every evening.       . testosterone cypionate (DEPOTESTOTERONE CYPIONATE) 200 MG/ML injection Inject 200 mg into the muscle every 7 (seven) days.        No current facility-administered medications for this visit.    Past Medical History  Diagnosis Date  . Hypertension   . Diabetes mellitus   . Carotid artery occlusion   . Stroke March 08, 2012  . Hyperlipidemia   . Peripheral arterial disease     nonhealing ulcers bilaterally on each great toe  . PONV (postoperative nausea and vomiting)   . Arthritis   . Hypothyroidism   . Shortness of breath   . Obstructive sleep apnea     Past Surgical History  Procedure Laterality Date  . Back surgery    . Cervical fusion    . Foot surgery    . Spine surgery    . Eye surgery    . Endarterectomy Left 11/29/2013    Procedure: ENDARTERECTOMY CAROTID;  Surgeon: Serafina Mitchell, MD;  Location: [facility] OR;  Service: Vascular;  Laterality: Left;  . Carotid endarterectomy Left   . Pv angiogram  02/18/2014    tibial vessel diseas bil.  . Angioplasty  02/28/14    diamond back orbital rotational atherectomy of Rt. tibial  . Lower ext duplex doppler  03/14/14    Rt ABI 1.2    JGO:TLXBWIO:? colds no fevers + cough, no weight changes Skin:no rashes or ulcers HEENT:no blurred vision, no congestion CV:see HPI PUL:see HPI GI:no diarrhea constipation or melena, no indigestion GU:no hematuria, no dysuria MS:no joint pain, no claudication, wound on rt great toe is healing Neuro:no syncope, no lightheadedness Endo:no diabetes, no thyroid disease  Wt Readings from Last 3 Encounters:  03/19/14 247 lb (112.038 kg)  02/28/14 214 lb 11.7 oz (97.4 kg)  02/28/14 214 lb 11.7 oz (97.4 kg)  wt in office 02/12/14 was 247 lb   PHYSICAL EXAM BP 119/63  Pulse 80  Ht 6'  (1.829 m)  Wt 247 lb (112.038 kg)  BMI 33.49 kg/m2 General:Pleasant affect, NAD Skin:Warm and dry, brisk capillary refill HEENT:normocephalic, sclera clear, mucus membranes moist Neck:supple, no JVD, no bruits  Heart:S1S2 RRR without murmur, gallup, rub or click Lungs: without rales,+ rhonchi, no wheezes MBT:DHRC, non tender, + BS, do not palpate liver spleen or  masses Ext:no lower ext edema, 2+ radial pulses, rt great toe wound is healing no eschar tissue Neuro:alert and oriented, MAE, follows commands, + facial symmetry   ASSESSMENT AND PLAN Peripheral arterial disease Patient with 100% right tibial disease undergoing diamondback orbital rotational atherectomy, PTA and stenting of a diffusely diseased posterior tibial artery in the setting of critical limb ischemia and a nonhealing right great toe ulcer.  Now with improved healing of the toe and improve Dopplers.  Obstructive sleep apnea Now with CPAP  Hypertension Controlled.    Patient will follow with Dr. Gwenlyn Found in 3 months- Dr. Gwenlyn Found did come into the room to review the wound.

## 2014-03-20 DIAGNOSIS — L97509 Non-pressure chronic ulcer of other part of unspecified foot with unspecified severity: Secondary | ICD-10-CM | POA: Diagnosis not present

## 2014-04-08 DIAGNOSIS — L97509 Non-pressure chronic ulcer of other part of unspecified foot with unspecified severity: Secondary | ICD-10-CM | POA: Diagnosis not present

## 2014-04-08 DIAGNOSIS — E1159 Type 2 diabetes mellitus with other circulatory complications: Secondary | ICD-10-CM | POA: Diagnosis not present

## 2014-04-08 DIAGNOSIS — L608 Other nail disorders: Secondary | ICD-10-CM | POA: Diagnosis not present

## 2014-04-08 DIAGNOSIS — L84 Corns and callosities: Secondary | ICD-10-CM | POA: Diagnosis not present

## 2014-04-08 DIAGNOSIS — I739 Peripheral vascular disease, unspecified: Secondary | ICD-10-CM | POA: Diagnosis not present

## 2014-04-10 ENCOUNTER — Encounter: Payer: Self-pay | Admitting: Cardiovascular Disease

## 2014-04-29 DIAGNOSIS — E1169 Type 2 diabetes mellitus with other specified complication: Secondary | ICD-10-CM | POA: Diagnosis not present

## 2014-04-29 DIAGNOSIS — I428 Other cardiomyopathies: Secondary | ICD-10-CM | POA: Diagnosis not present

## 2014-04-29 DIAGNOSIS — I739 Peripheral vascular disease, unspecified: Secondary | ICD-10-CM | POA: Diagnosis not present

## 2014-04-29 DIAGNOSIS — K2100 Gastro-esophageal reflux disease with esophagitis, without bleeding: Secondary | ICD-10-CM | POA: Diagnosis not present

## 2014-05-06 ENCOUNTER — Telehealth: Payer: Self-pay | Admitting: *Deleted

## 2014-05-06 NOTE — Telephone Encounter (Signed)
Returned [facility]AP supply [facility] to [facility]. 

## 2014-05-20 DIAGNOSIS — IMO0002 Reserved for concepts with insufficient information to code with codable children: Secondary | ICD-10-CM | POA: Diagnosis not present

## 2014-05-20 DIAGNOSIS — L97509 Non-pressure chronic ulcer of other part of unspecified foot with unspecified severity: Secondary | ICD-10-CM | POA: Diagnosis not present

## 2014-05-29 DIAGNOSIS — L97509 Non-pressure chronic ulcer of other part of unspecified foot with unspecified severity: Secondary | ICD-10-CM | POA: Diagnosis not present

## 2014-05-29 DIAGNOSIS — L03119 Cellulitis of unspecified part of limb: Secondary | ICD-10-CM | POA: Diagnosis not present

## 2014-05-29 DIAGNOSIS — L02619 Cutaneous abscess of unspecified foot: Secondary | ICD-10-CM | POA: Diagnosis not present

## 2014-06-03 DIAGNOSIS — L97509 Non-pressure chronic ulcer of other part of unspecified foot with unspecified severity: Secondary | ICD-10-CM | POA: Diagnosis not present

## 2014-06-10 DIAGNOSIS — L97509 Non-pressure chronic ulcer of other part of unspecified foot with unspecified severity: Secondary | ICD-10-CM | POA: Diagnosis not present

## 2014-06-11 ENCOUNTER — Ambulatory Visit (INDEPENDENT_AMBULATORY_CARE_PROVIDER_SITE_OTHER): Payer: Medicare Other | Admitting: Cardiovascular Disease

## 2014-06-11 ENCOUNTER — Encounter: Payer: Self-pay | Admitting: Cardiovascular Disease

## 2014-06-11 VITALS — BP 118/60 | HR 80 | Ht 72.0 in | Wt 253.0 lb

## 2014-06-11 DIAGNOSIS — Z79899 Other long term (current) drug therapy: Secondary | ICD-10-CM

## 2014-06-11 DIAGNOSIS — I6529 Occlusion and stenosis of unspecified carotid artery: Secondary | ICD-10-CM | POA: Diagnosis not present

## 2014-06-11 DIAGNOSIS — I6522 Occlusion and stenosis of left carotid artery: Secondary | ICD-10-CM

## 2014-06-11 DIAGNOSIS — I998 Other disorder of circulatory system: Secondary | ICD-10-CM

## 2014-06-11 DIAGNOSIS — E785 Hyperlipidemia, unspecified: Secondary | ICD-10-CM | POA: Diagnosis not present

## 2014-06-11 DIAGNOSIS — I70229 Atherosclerosis of native arteries of extremities with rest pain, unspecified extremity: Secondary | ICD-10-CM

## 2014-06-11 DIAGNOSIS — I739 Peripheral vascular disease, unspecified: Secondary | ICD-10-CM | POA: Diagnosis not present

## 2014-06-11 DIAGNOSIS — I999 Unspecified disorder of circulatory system: Secondary | ICD-10-CM

## 2014-06-11 DIAGNOSIS — I10 Essential (primary) hypertension: Secondary | ICD-10-CM | POA: Diagnosis not present

## 2014-06-11 NOTE — Progress Notes (Signed)
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 He now works at [employer]. He was referred by Dr. [name] from [facility] for peripheral vasodilation because of nonhealing small ulcers on the dorsal surface of his great toes bilaterally. His cardiovascular risk factor profile is remarkable for treated hypertension, diabetes and hyperlipidemia. He has had a stroke 03/08/12 and has carotid disease followed by Dr. [name] ,. He denies chest pain, shortness of breath or claudication. He has had 2 small ulcers on the dorsal surface of both great toes for 6-9 months which have been slow to heal. Since I saw him last in November he has had an elective left carotid endarterectomy performed by Dr. [name] . He was also placed on CPAP because of obstructive sleep apnea which he is benefiting from. He is compliant with his CPAP.[name] He has developed a nonhealing wound on his right great toe which had been fairly rapidly progressing and he sent back for further evaluation. I performed lower extremity Doppler studies last October suggesting tibial vessel disease..I performed angiography on him 03/19/14 demonstrating severe tibial disease and ultimately performed diamondback orbital rotational atherectomy of his right posterior tibial establishing excellent in-line flow. This resulted in ultimate healing of his right great toe ischemic ulcer, improvement in his symptoms and Dopplers as well. He has since developed an ischemic ulcer on the plantar surface of the medial aspect of his right foot as a result of trauma and is being treated by his podiatrist, Dr. Barkley Bruns.    Current Outpatient Prescriptions  Medication Sig  Dispense Refill  . amLODipine (NORVASC) 10 MG tablet       . aspirin EC 325 MG EC tablet Take 1 tablet (325 mg total) by mouth daily.  30 tablet  0  . b complex vitamins tablet Take 1 tablet by mouth daily.      . clopidogrel (PLAVIX) 75 MG tablet Take 75 mg by mouth daily.       . Dapagliflozin Propanediol (FARXIGA) 5 MG TABS Take 5 mg by mouth every evening.       . Empagliflozin (JARDIANCE) 10 MG TABS Take 10 mg by mouth daily.      [name] gabapentin (NEURONTIN) 600 MG tablet Take 600 mg by mouth 4 (four) times daily.       [name] glimepiride (AMARYL) 4 MG tablet Take 4 mg by mouth daily with breakfast.       . levothyroxine (SYNTHROID, LEVOTHROID) 25 MCG tablet Take 25 mcg by mouth daily.       [name] losartan-hydrochlorothiazide (HYZAAR) 100-12.5 MG per tablet Take 1 tablet by mouth daily.      . Magnesium 250 MG TABS Take 250 mg by mouth daily.       . Melatonin 5 MG TABS Take 10 mg by mouth at bedtime.       . Milk Thistle 250 MG CAPS Take 250 mg by mouth 2 (two) times daily.      . Misc Natural Products (OSTEO BI-FLEX JOINT SHIELD PO) Take 1 tablet by mouth 2 (two) times daily.      . Multiple Vitamins-Minerals (ONE-A-DAY 50 PLUS PO) Take 1 tablet by mouth daily.      . Omega-3 Fatty Acids (  FISH OIL PO) Take 1,200 mg by mouth 2 (two) times daily.      [name] oxyCODONE (ROXICODONE) 5 MG immediate release tablet Take 1 tablet (5 mg total) by mouth every 6 (six) hours as needed for severe pain.  30 tablet  0  . pantoprazole (PROTONIX) 40 MG tablet Take 40 mg by mouth 2 (two) times daily.      . pravastatin (PRAVACHOL) 40 MG tablet Take 40 mg by mouth every morning.       [name] rOPINIRole (REQUIP) 0.5 MG tablet Take 1 tablet (0.5 mg total) by mouth at bedtime.  30 tablet  11  . sitaGLIPtin (JANUVIA) 100 MG tablet Take 100 mg by mouth every evening.       . testosterone cypionate (DEPOTESTOTERONE CYPIONATE) 200 MG/ML injection Inject 200 mg into the muscle every 7 (seven) days.        No current  facility-administered medications for this visit.    Allergies  Allergen Reactions  . Codeine Nausea And Vomiting  . Tramadol Nausea Only    History   Social History  . Marital Status: Married    Spouse Name: N/A    Number of Children: N/A  . Years of Education: N/A   Occupational History  . Not on file.   Social History Main Topics  . Smoking status: Former Smoker -- 1 years    Types: Pipe  . Smokeless tobacco: Never Used  . Alcohol Use: No  . Drug Use: No  . Sexual Activity: Not on file   Other Topics Concern  . Not on file   Social History Narrative  . No narrative on file     Review of Systems: General: negative for chills, fever, night sweats or weight changes.  Cardiovascular: negative for chest pain, dyspnea on exertion, edema, orthopnea, palpitations, paroxysmal nocturnal dyspnea or shortness of breath Dermatological: negative for rash Respiratory: negative for cough or wheezing Urologic: negative for hematuria Abdominal: negative for nausea, vomiting, diarrhea, bright red blood per rectum, melena, or hematemesis Neurologic: negative for visual changes, syncope, or dizziness All other systems reviewed and are otherwise negative except as noted above.    Blood pressure 118/60, pulse 80, height 6' (1.829 m), weight 253 lb (114.76 kg).  General appearance: alert and no distress Neck: no adenopathy, no carotid bruit, no JVD, supple, symmetrical, trachea midline and thyroid not enlarged, symmetric, no tenderness/mass/nodules Lungs: clear to auscultation bilaterally Heart: regular rate and rhythm, S1, S2 normal, no murmur, click, rub or gallop Extremities: extremities normal, atraumatic, no cyanosis or edema and his right foot is wrapped. He has a medial plantar ischemic ulcer  EKG normal sinus rhythm at 76 without ST or T wave changes  ASSESSMENT AND PLAN:   Hypertension Under good control on current medications  Hyperlipidemia On statin therapy  followed by his PCP  Carotid stenosis History of carotid artery disease status post endarterectomy performed by Dr. [name] and followed by him as well.  Critical lower limb ischemia Status post diamondback atherectomy, PTA of posterior tibial artery on the right were ischemic ulcer on his right great toe which subsequently healed. Followup Dopplers performed in June with a patent posterior tibial right ABI 1.2. He is a well-developed wound on the lateral aspect of the plantar surface of his right foot which is being treated aggressively and locally by Dr. Barkley Bruns. I'm going to recheck lower extremity arterial Doppler studies to document patency of his posterior tibial artery.      Pearletha Forge.  Gwenlyn Found MD [facility], [facility] 06/11/2014 8:30 AM

## 2014-06-11 NOTE — Assessment & Plan Note (Signed)
Under good control on current medications 

## 2014-06-11 NOTE — Assessment & Plan Note (Signed)
On statin therapy followed by his PCP 

## 2014-06-11 NOTE — Patient Instructions (Signed)
  We will see you back in follow up in 3 months with Dr Gwenlyn Found.   Dr Gwenlyn Found has ordered: 1. lower extremity arterial doppler- During this test, ultrasound is used to evaluate arterial blood flow in the legs. Allow approximately one hour for this exam.

## 2014-06-11 NOTE — Assessment & Plan Note (Signed)
History of carotid artery disease status post endarterectomy performed by Dr. [name] and followed by him as well.

## 2014-06-11 NOTE — Assessment & Plan Note (Signed)
Status post diamondback atherectomy, PTA of posterior tibial artery on the right were ischemic ulcer on his right great toe which subsequently healed. Followup Dopplers performed in June with a patent posterior tibial right ABI 1.2. He is a well-developed wound on the lateral aspect of the plantar surface of his right foot which is being treated aggressively and locally by Dr. Barkley Bruns. I'm going to recheck lower extremity arterial Doppler studies to document patency of his posterior tibial artery.

## 2014-06-14 ENCOUNTER — Encounter: Payer: Self-pay | Admitting: Surgery

## 2014-06-17 ENCOUNTER — Other Ambulatory Visit (HOSPITAL_COMMUNITY): Payer: Medicare Other

## 2014-06-17 ENCOUNTER — Ambulatory Visit: Payer: Medicare Other | Admitting: Surgery

## 2014-06-17 DIAGNOSIS — L97509 Non-pressure chronic ulcer of other part of unspecified foot with unspecified severity: Secondary | ICD-10-CM | POA: Diagnosis not present

## 2014-06-24 DIAGNOSIS — M79675 Pain in left toe(s): Secondary | ICD-10-CM | POA: Diagnosis not present

## 2014-06-24 DIAGNOSIS — L97529 Non-pressure chronic ulcer of other part of left foot with unspecified severity: Secondary | ICD-10-CM | POA: Diagnosis not present

## 2014-06-24 DIAGNOSIS — M79671 Pain in right foot: Secondary | ICD-10-CM | POA: Diagnosis not present

## 2014-06-24 DIAGNOSIS — L97519 Non-pressure chronic ulcer of other part of right foot with unspecified severity: Secondary | ICD-10-CM | POA: Diagnosis not present

## 2014-06-24 DIAGNOSIS — M89371 Hypertrophy of bone, right ankle and foot: Secondary | ICD-10-CM | POA: Diagnosis not present

## 2014-06-27 DIAGNOSIS — L603 Nail dystrophy: Secondary | ICD-10-CM | POA: Diagnosis not present

## 2014-06-27 DIAGNOSIS — E1151 Type 2 diabetes mellitus with diabetic peripheral angiopathy without gangrene: Secondary | ICD-10-CM | POA: Diagnosis not present

## 2014-06-27 DIAGNOSIS — I739 Peripheral vascular disease, unspecified: Secondary | ICD-10-CM | POA: Diagnosis not present

## 2014-06-27 DIAGNOSIS — L84 Corns and callosities: Secondary | ICD-10-CM | POA: Diagnosis not present

## 2014-07-01 DIAGNOSIS — L97519 Non-pressure chronic ulcer of other part of right foot with unspecified severity: Secondary | ICD-10-CM | POA: Diagnosis not present

## 2014-07-01 DIAGNOSIS — L97529 Non-pressure chronic ulcer of other part of left foot with unspecified severity: Secondary | ICD-10-CM | POA: Diagnosis not present

## 2014-07-02 ENCOUNTER — Telehealth: Payer: Self-pay | Admitting: Orthopedic Surgery

## 2014-07-02 ENCOUNTER — Ambulatory Visit (HOSPITAL_COMMUNITY)
Admission: RE | Admit: 2014-07-02 | Discharge: 2014-07-02 | Disposition: A | Discharge status: Home / Self Care | Payer: Medicare Other | Source: Ambulatory Visit | Attending: Cardiology | Admitting: Cardiology

## 2014-07-02 DIAGNOSIS — L97909 Non-pressure chronic ulcer of unspecified part of unspecified lower leg with unspecified severity: Secondary | ICD-10-CM | POA: Insufficient documentation

## 2014-07-02 DIAGNOSIS — I70229 Atherosclerosis of native arteries of extremities with rest pain, unspecified extremity: Secondary | ICD-10-CM

## 2014-07-02 DIAGNOSIS — I998 Other disorder of circulatory system: Secondary | ICD-10-CM | POA: Diagnosis not present

## 2014-07-02 DIAGNOSIS — E119 Type 2 diabetes mellitus without complications: Secondary | ICD-10-CM | POA: Diagnosis not present

## 2014-07-02 NOTE — Progress Notes (Signed)
Lower Extremity Arterial Duplex Completed. °Brianna L Mazza,RVT °

## 2014-07-02 NOTE — Telephone Encounter (Signed)
Call received from patient, requests to speak directly with Dr Aline Brochure; states it is reference to his foot-- mainly his big toe, of right foot.  Patient has not been seen in our office since 06/26/08; he had been referred to Dr Beola Cord.    Reports appear in the converson; copy of most recent report, date of service 10/29/08, is printed out and in Dr's box.  Patient would not provide any additional information as to why he needs to speak with Dr Aline Brochure at this time.  His ph# is [phone].

## 2014-07-05 ENCOUNTER — Encounter: Payer: Self-pay | Admitting: Surgery

## 2014-07-08 ENCOUNTER — Ambulatory Visit (INDEPENDENT_AMBULATORY_CARE_PROVIDER_SITE_OTHER): Payer: Medicare Other | Admitting: Surgery

## 2014-07-08 ENCOUNTER — Encounter: Payer: Self-pay | Admitting: Surgery

## 2014-07-08 ENCOUNTER — Ambulatory Visit (HOSPITAL_COMMUNITY)
Admission: RE | Admit: 2014-07-08 | Discharge: 2014-07-08 | Disposition: A | Discharge status: Home / Self Care | Payer: Medicare Other | Source: Ambulatory Visit | Attending: Surgery | Admitting: Surgery

## 2014-07-08 VITALS — BP 143/67 | HR 86 | Resp 16 | Ht 72.0 in | Wt 255.0 lb

## 2014-07-08 DIAGNOSIS — I739 Peripheral vascular disease, unspecified: Principal | ICD-10-CM

## 2014-07-08 DIAGNOSIS — I6523 Occlusion and stenosis of bilateral carotid arteries: Secondary | ICD-10-CM | POA: Diagnosis not present

## 2014-07-08 DIAGNOSIS — I6529 Occlusion and stenosis of unspecified carotid artery: Secondary | ICD-10-CM | POA: Diagnosis not present

## 2014-07-08 DIAGNOSIS — I779 Disorder of arteries and arterioles, unspecified: Secondary | ICD-10-CM

## 2014-07-08 DIAGNOSIS — L97519 Non-pressure chronic ulcer of other part of right foot with unspecified severity: Secondary | ICD-10-CM | POA: Diagnosis not present

## 2014-07-08 DIAGNOSIS — L97529 Non-pressure chronic ulcer of other part of left foot with unspecified severity: Secondary | ICD-10-CM | POA: Diagnosis not present

## 2014-07-08 NOTE — Progress Notes (Signed)
HISTORY AND PHYSICAL     CC:  Follow up carotid duplex scan  Referring Provider:  [name], MD  HPI: This is a 68 y.o. male who has known carotid stenosis is here for f/u carotid duplex scan after left CEA 11/29/13. He denies amaurosis fugax, paresthesias, or hemiparesis.  He states that since he was last here, he has undergone angiogram by Dr. Gwenlyn Found with "roto rooter" of his right leg.  He states that he does have problems with neuropathy and restless leg syndrome.  He states that he goes to the podiatrist for foot care.  He is on a statin for his hypercholesterolemia.  He is on po medications for his diabetes.  He is on multiple medications for hypertension.    Past Medical History  Diagnosis Date  . Hypertension   . Diabetes mellitus   . Carotid artery occlusion   . Stroke March 08, 2012  . Hyperlipidemia   . Peripheral arterial disease     nonhealing ulcers bilaterally on each great toe  . PONV (postoperative nausea and vomiting)   . Arthritis   . Hypothyroidism   . Shortness of breath   . Obstructive sleep apnea    Past Surgical History  Procedure Laterality Date  . Back surgery    . Cervical fusion    . Foot surgery    . Spine surgery    . Eye surgery    . Endarterectomy Left 11/29/2013    Procedure: ENDARTERECTOMY CAROTID;  Surgeon: Serafina Mitchell, MD;  Location: [facility] OR;  Service: Vascular;  Laterality: Left;  . Carotid endarterectomy Left   . Pv angiogram  02/18/2014    tibial vessel diseas bil.  . Angioplasty  02/28/14    diamond back orbital rotational atherectomy of Rt. tibial  . Lower ext duplex doppler  03/14/14    Rt ABI 1.2    Allergies  Allergen Reactions  . Codeine Nausea And Vomiting  . Tramadol Nausea Only    Current Outpatient Prescriptions  Medication Sig Dispense Refill  . amLODipine (NORVASC) 10 MG tablet       . aspirin EC 325 MG EC tablet Take 1 tablet (325 mg total) by mouth daily.  30 tablet  0  . clopidogrel (PLAVIX) 75 MG tablet  Take 75 mg by mouth daily.       . Empagliflozin (JARDIANCE) 10 MG TABS Take 10 mg by mouth daily.      [name] gabapentin (NEURONTIN) 600 MG tablet Take 600 mg by mouth 4 (four) times daily.       [name] glimepiride (AMARYL) 4 MG tablet Take 4 mg by mouth daily with breakfast.       . levothyroxine (SYNTHROID, LEVOTHROID) 25 MCG tablet Take 25 mcg by mouth daily.       [name] losartan-hydrochlorothiazide (HYZAAR) 100-12.5 MG per tablet Take 1 tablet by mouth daily.      . Magnesium 250 MG TABS Take 250 mg by mouth daily.       . Melatonin 5 MG TABS Take 10 mg by mouth at bedtime.       . Misc Natural Products (OSTEO BI-FLEX JOINT SHIELD PO) Take 1 tablet by mouth 2 (two) times daily.      . Multiple Vitamins-Minerals (ONE-A-DAY 50 PLUS PO) Take 1 tablet by mouth daily.      . Omega-3 Fatty Acids (FISH OIL PO) Take 1,200 mg by mouth 2 (two) times daily.      [name] oxyCODONE (ROXICODONE) 5  MG immediate release tablet Take 1 tablet (5 mg total) by mouth every 6 (six) hours as needed for severe pain.  30 tablet  0  . pantoprazole (PROTONIX) 40 MG tablet Take 40 mg by mouth 2 (two) times daily.      . pravastatin (PRAVACHOL) 40 MG tablet Take 40 mg by mouth every morning.       . sitaGLIPtin (JANUVIA) 100 MG tablet Take 100 mg by mouth every evening.       . testosterone cypionate (DEPOTESTOTERONE CYPIONATE) 200 MG/ML injection Inject 200 mg into the muscle every 7 (seven) days.       [name] b complex vitamins tablet Take 1 tablet by mouth daily.      . Dapagliflozin Propanediol (FARXIGA) 5 MG TABS Take 5 mg by mouth every evening.       . Milk Thistle 250 MG CAPS Take 250 mg by mouth 2 (two) times daily.      [name] rOPINIRole (REQUIP) 0.5 MG tablet Take 1 tablet (0.5 mg total) by mouth at bedtime.  30 tablet  11   No current facility-administered medications for this visit.    Pt's meds include: Statin:  Yes.   Beta Blocker:  No. Aspirin:  Yes.   Other antiplatelets/anticoagulants:  Yes.  -Plavix   Family History    Problem Relation Age of Onset  . Heart disease Mother   . Hypertension Mother   . Heart attack Mother   . Diabetes Son   . Heart disease Son   . Hypertension Son   . Hypertension Father   . Diabetes Father     History   Social History  . Marital Status: Married    Spouse Name: N/A    Number of Children: N/A  . Years of Education: N/A   Occupational History  . Not on file.   Social History Main Topics  . Smoking status: Former Smoker -- 1 years    Types: Pipe    Quit date: 07/08/1977  . Smokeless tobacco: Never Used  . Alcohol Use: No  . Drug Use: No  . Sexual Activity: Not on file   Other Topics Concern  . Not on file   Social History Narrative  . No narrative on file     ROS: [x]  Positive   [ ]  Negative   [ ]  All sytems reviewed and are negative  Cardiovascular: []  chest pain/pressure []  palpitations []  SOB lying flat []  DOE []  pain in legs while walking []  pain in feet when lying flat [x]  hx of DVT []  hx of phlebitis []  swelling in legs []  varicose veins  Pulmonary: []  productive cough []  asthma []  wheezing  Neurologic: []  weakness in []  arms []  legs []  numbness in []  arms []  legs [] difficulty speaking or slurred speech []  temporary loss of vision in one eye []  dizziness  Hematologic: []  bleeding problems []  problems with blood clotting easily  GI []  vomiting blood []  blood in stool  GU: []  burning with urination []  blood in urine  Psychiatric: []  hx of major depression  Integumentary: []  rashes [x]  ulcers-left great toe and right ball of foot being followed by podiatry  Constitutional: []  fever []  chills   PHYSICAL EXAMINATION:  Filed Vitals:   07/08/14 1558  BP: 143/67  Pulse: 86  Resp:    Body mass index is 34.58 kg/(m^2).  General:  WDWN in NAD Gait: Normal HENT: WNL; normocephalic Eyes: PERRL Pulmonary: normal non-labored breathing , without Rales, rhonchi,  wheezing Cardiac: RRR, without  Murmurs, rubs or  gallops; without carotid bruits Abdomen: soft, NT, no masses Skin: without rashes,  ulcers  Vascular Exam/Pulses:   Right Left  Radial 2+ (normal) 2+ (normal)  Ulnar 2+ (normal) 2+ (normal)  Popliteal Unable to palpate  Unable to palpate   DP 1+ (weak) Unable to palpate   PT Unable to palpate  1+ (weak)       Extremities: without ischemic changes, without Gangrene , without cellulitis; with open wounds that are bandaged from the podiatrist;  Musculoskeletal: without muscle wasting or atrophy  Neurologic: A&O X 3; Appropriate Affect ; SENSATION: normal; MOTOR FUNCTION:  moving all extremities equally. Speech is fluent/normal   Non-Invasive Vascular Imaging: Carotid Duplex Scan:  07/08/2014  1.  Patent left CEA site with no left ICA stenosis 2.  Doppler velocities suggest a < 40% stenosis of the right proximal ICA.  **No significant stenosis of the right ECA or bilateral CCA **Left ECA stenosis noted  ASSESSMENT: 68 y.o. male here for f/u carotid duplex scan s/p left CEA 11/29/13.   PLAN: -pt doing well since left CEA without any neurologic events. -he will f/u with Dr. [name] in one year with carotid duplex -he will contact us sooner if he has any issues -lower limb arterial disease s/p atherectomy right leg per Dr. Tsosie Billing, PA-C [facility] [phone]  Clinic MD:   Pt seen and examined in conjunction with Dr. [name]    I agree with the above.  I seen and evaluated the patient.  He is status post left carotid endarterectomy.  He initially presented with a right brain stroke.  Workup revealed a high-grade left carotid stenosis.  On 11/29/2013 he underwent left carotid endarterectomy with bovine pericardial patch angioplasty.  Simultaneously he had resection with primary anastomosis of the left common carotid artery.  Intraoperative findings were 95% stenosis.  The patient's ultrasound today shows widely patent left carotid  endarterectomy site.  He'll be scheduled for followup in one year  Wells Brabham

## 2014-07-08 NOTE — Addendum Note (Signed)
Addended by: Mena Goes on: 07/08/2014 05:09 PM   Modules accepted: Orders

## 2014-07-09 DIAGNOSIS — Z23 Encounter for immunization: Secondary | ICD-10-CM | POA: Diagnosis not present

## 2014-07-12 ENCOUNTER — Telehealth (HOSPITAL_COMMUNITY): Payer: Self-pay | Admitting: *Deleted

## 2014-07-22 DIAGNOSIS — L97529 Non-pressure chronic ulcer of other part of left foot with unspecified severity: Secondary | ICD-10-CM | POA: Diagnosis not present

## 2014-07-22 DIAGNOSIS — L97519 Non-pressure chronic ulcer of other part of right foot with unspecified severity: Secondary | ICD-10-CM | POA: Diagnosis not present

## 2014-07-31 ENCOUNTER — Encounter: Payer: Self-pay | Admitting: Cardiovascular Disease

## 2014-07-31 ENCOUNTER — Ambulatory Visit (INDEPENDENT_AMBULATORY_CARE_PROVIDER_SITE_OTHER): Payer: Medicare Other | Admitting: Cardiovascular Disease

## 2014-07-31 VITALS — BP 102/62 | HR 84 | Ht 72.0 in | Wt 246.5 lb

## 2014-07-31 DIAGNOSIS — I10 Essential (primary) hypertension: Secondary | ICD-10-CM | POA: Diagnosis not present

## 2014-07-31 DIAGNOSIS — I998 Other disorder of circulatory system: Secondary | ICD-10-CM | POA: Diagnosis not present

## 2014-07-31 DIAGNOSIS — I70229 Atherosclerosis of native arteries of extremities with rest pain, unspecified extremity: Secondary | ICD-10-CM

## 2014-07-31 DIAGNOSIS — I6529 Occlusion and stenosis of unspecified carotid artery: Secondary | ICD-10-CM

## 2014-07-31 DIAGNOSIS — E785 Hyperlipidemia, unspecified: Secondary | ICD-10-CM

## 2014-07-31 DIAGNOSIS — I6522 Occlusion and stenosis of left carotid artery: Secondary | ICD-10-CM | POA: Diagnosis not present

## 2014-07-31 NOTE — Assessment & Plan Note (Signed)
History of hyperlipidemia on pravastatin, followed by his primary care physician

## 2014-07-31 NOTE — Progress Notes (Signed)
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 He now works at [employer]. He was referred by [name] from [facility] for peripheral vasodilation because of nonhealing small ulcers on the dorsal surface of his great toes bilaterally. His cardiovascular risk factor profile is remarkable for treated hypertension, diabetes and hyperlipidemia. He has had a stroke 03/08/12 and has carotid disease followed by [name] ,. He denies chest pain, shortness of breath or claudication. He has had 2 small ulcers on the dorsal surface of both great toes for 6-9 months which have been slow to heal. Since I saw him last in November he has had an elective left carotid endarterectomy performed by [name] . He was also placed on CPAP because of obstructive sleep apnea which he is benefiting from. He is compliant with his CPAP.[name] He has developed a nonhealing wound on his right great toe which had been fairly rapidly progressing and he sent back for further evaluation. I performed lower extremity Doppler studies last October suggesting tibial vessel disease..I performed angiography on him 03/19/14 demonstrating severe tibial disease and ultimately performed diamondback orbital rotational atherectomy of his right posterior tibial establishing excellent in-line flow. This resulted in ultimate healing of his right great toe ischemic ulcer, improvement in his symptoms and Dopplers as well. He has since developed an ischemic ulcer on the plantar surface of the medial aspect of his right foot as a result of trauma and is being treated by his podiatrist, [name].follow-up Dopplers post intervention revealed an increase  in his right ABI to 1.2 however, his most recent Dopplers performed 07/02/14 revealed a decrease in his right eye twice a day 0.75 with occlusion of his posterior tibial artery.   Current Outpatient Prescriptions  Medication Sig Dispense Refill  . amLODipine (NORVASC) 10 MG tablet     . aspirin EC 325 MG EC tablet Take 1 tablet (325 mg total) by mouth daily. 30 tablet 0  . b complex vitamins tablet Take 1 tablet by mouth daily.    . clopidogrel (PLAVIX) 75 MG tablet Take 75 mg by mouth daily.     . Dapagliflozin Propanediol (FARXIGA) 5 MG TABS Take 5 mg by mouth every evening.     . DULoxetine (CYMBALTA) 20 MG capsule Take 1 capsule by mouth 2 (two) times daily.    . Empagliflozin (JARDIANCE) 10 MG TABS Take 10 mg by mouth daily.    [name] gabapentin (NEURONTIN) 600 MG tablet Take 600 mg by mouth 4 (four) times daily.     [name] glimepiride (AMARYL) 4 MG tablet Take 4 mg by mouth daily with breakfast.     . levothyroxine (SYNTHROID, LEVOTHROID) 25 MCG tablet Take 25 mcg by mouth daily.     [name] losartan-hydrochlorothiazide (HYZAAR) 100-12.5 MG per tablet Take 1 tablet by mouth daily.    . Magnesium 250 MG TABS Take 250 mg by mouth daily.     . Melatonin 5 MG TABS Take 10 mg by mouth at bedtime.     . Milk Thistle 250 MG CAPS Take 250 mg by mouth 2 (two) times daily.    . Misc Natural Products (OSTEO BI-FLEX JOINT SHIELD PO) Take 1 tablet by mouth 2 (two) times  daily.    . Multiple Vitamins-Minerals (ONE-A-DAY 50 PLUS PO) Take 1 tablet by mouth daily.    . Omega-3 Fatty Acids (FISH OIL PO) Take 1,200 mg by mouth 2 (two) times daily.    [name] oxyCODONE (ROXICODONE) 5 MG immediate release tablet Take 1 tablet (5 mg total) by mouth every 6 (six) hours as needed for severe pain. 30 tablet 0  . pantoprazole (PROTONIX) 40 MG tablet Take 40 mg by mouth 2 (two) times daily.    . pravastatin (PRAVACHOL) 40 MG tablet Take 40 mg by mouth every morning.     [name] rOPINIRole (REQUIP) 0.5 MG tablet Take 1 tablet (0.5 mg total)  by mouth at bedtime. 30 tablet 11  . sitaGLIPtin (JANUVIA) 100 MG tablet Take 100 mg by mouth every evening.     . testosterone cypionate (DEPOTESTOTERONE CYPIONATE) 200 MG/ML injection Inject 200 mg into the muscle every 7 (seven) days.      No current facility-administered medications for this visit.    Allergies  Allergen Reactions  . Codeine Nausea And Vomiting  . Tramadol Nausea Only    History   Social History  . Marital Status: Married    Spouse Name: N/A    Number of Children: N/A  . Years of Education: N/A   Occupational History  . Not on file.   Social History Main Topics  . Smoking status: Former Smoker -- 1 years    Types: Pipe    Quit date: 07/08/1977  . Smokeless tobacco: Never Used  . Alcohol Use: No  . Drug Use: No  . Sexual Activity: Not on file   Other Topics Concern  . Not on file   Social History Narrative     Review of Systems: General: negative for chills, fever, night sweats or weight changes.  Cardiovascular: negative for chest pain, dyspnea on exertion, edema, orthopnea, palpitations, paroxysmal nocturnal dyspnea or shortness of breath Dermatological: negative for rash Respiratory: negative for cough or wheezing Urologic: negative for hematuria Abdominal: negative for nausea, vomiting, diarrhea, bright red blood per rectum, melena, or hematemesis Neurologic: negative for visual changes, syncope, or dizziness All other systems reviewed and are otherwise negative except as noted above.    Blood pressure 102/62, pulse 84, height 6' (1.829 m), weight 246 lb 8 oz (111.812 kg).  General appearance: alert and no distress Neck: no adenopathy, no carotid bruit, no JVD, supple, symmetrical, trachea midline and thyroid not enlarged, symmetric, no tenderness/mass/nodules Lungs: clear to auscultation bilaterally Heart: regular rate and rhythm, S1, S2 normal, no murmur, click, rub or gallop Extremities: extremities normal, atraumatic, no cyanosis or  edema and there is a quarter-sized wound on the medial aspect of the plantar surface of the right foot which is slowly healing  EKG not performed today  ASSESSMENT AND PLAN:   Hypertension Well-controlled today with a blood pressure 102/62 on losartan and hydrochlorothiazide and amlodipine which we will continue at current doses.  Carotid stenosis Status post left carotid endarterectomy performed by ChristopherBrabham  which she follows by duplex ultrasound.  Hyperlipidemia History of hyperlipidemia on pravastatin, followed by his primary care physician  Critical lower limb ischemia History of critical limb ischemia status post PTCA of the right posterior tibial artery by myself 02/28/14 with resultant improvement in his right ABI to 1.2 and healing of his right great toe ulcer. Subsequently that he developed trauma to the plantar surface of his right foot with an ulcer that developed on a slowly healing with aggressive local  care. Recent Dopplers performed 07/02/14 revealed a decrease in his right ABI from 1.2 2.75 with reocclusion of his posterior tibial artery. At this point, I do not think that any surgical procedure on his foot would heal adequately. If his wound on the plantar surface of his foot continues to heal, I do not think we need to be intervened however if it becomes worse he would be a candidate for re-intervention on his posterior tibial artery.      Christopher Harp MD FACP,FACC,FAHA, [facility] 07/31/2014 11:44 AM

## 2014-07-31 NOTE — Patient Instructions (Signed)
We request that you follow-up in: 3 months with an extender and in 6 months with Dr Berry  You will receive a reminder letter in the mail two months in advance. If you don't receive a letter, please call our office to schedule the follow-up appointment.   

## 2014-07-31 NOTE — Assessment & Plan Note (Signed)
History of critical limb ischemia status post PTCA of the right posterior tibial artery by myself 02/28/14 with resultant improvement in his right ABI to 1.2 and healing of his right great toe ulcer. Subsequently that he developed trauma to the plantar surface of his right foot with an ulcer that developed on a slowly healing with aggressive local care. Recent Dopplers performed 07/02/14 revealed a decrease in his right ABI from 1.2 2.75 with reocclusion of his posterior tibial artery. At this point, I do not think that any surgical procedure on his foot would heal adequately. If his wound on the plantar surface of his foot continues to heal, I do not think we need to be intervened however if it becomes worse he would be a candidate for re-intervention on his posterior tibial artery.

## 2014-07-31 NOTE — Assessment & Plan Note (Signed)
Well-controlled today with a blood pressure 102/62 on losartan and hydrochlorothiazide and amlodipine which we will continue at current doses.

## 2014-07-31 NOTE — Assessment & Plan Note (Signed)
Status post left carotid endarterectomy performed by Dr.Brabham  which she follows by duplex ultrasound.

## 2014-08-05 DIAGNOSIS — L97529 Non-pressure chronic ulcer of other part of left foot with unspecified severity: Secondary | ICD-10-CM | POA: Diagnosis not present

## 2014-08-05 DIAGNOSIS — L97519 Non-pressure chronic ulcer of other part of right foot with unspecified severity: Secondary | ICD-10-CM | POA: Diagnosis not present

## 2014-08-12 ENCOUNTER — Telehealth (HOSPITAL_COMMUNITY): Payer: Self-pay | Admitting: *Deleted

## 2014-08-12 DIAGNOSIS — L97529 Non-pressure chronic ulcer of other part of left foot with unspecified severity: Secondary | ICD-10-CM | POA: Diagnosis not present

## 2014-08-14 ENCOUNTER — Telehealth (HOSPITAL_COMMUNITY): Payer: Self-pay | Admitting: *Deleted

## 2014-08-20 ENCOUNTER — Ambulatory Visit: Payer: Medicare Other | Admitting: Cardiovascular Disease

## 2014-08-22 DIAGNOSIS — L97519 Non-pressure chronic ulcer of other part of right foot with unspecified severity: Secondary | ICD-10-CM | POA: Diagnosis not present

## 2014-08-22 DIAGNOSIS — L97529 Non-pressure chronic ulcer of other part of left foot with unspecified severity: Secondary | ICD-10-CM | POA: Diagnosis not present

## 2014-08-29 ENCOUNTER — Encounter (HOSPITAL_COMMUNITY): Payer: Self-pay | Admitting: Cardiovascular Disease

## 2014-09-02 ENCOUNTER — Encounter (HOSPITAL_COMMUNITY): Payer: Self-pay | Admitting: *Deleted

## 2014-09-02 ENCOUNTER — Other Ambulatory Visit (HOSPITAL_COMMUNITY): Payer: Self-pay | Admitting: Cardiovascular Disease

## 2014-09-02 DIAGNOSIS — L97529 Non-pressure chronic ulcer of other part of left foot with unspecified severity: Secondary | ICD-10-CM | POA: Diagnosis not present

## 2014-09-02 DIAGNOSIS — I998 Other disorder of circulatory system: Secondary | ICD-10-CM

## 2014-09-02 DIAGNOSIS — L97519 Non-pressure chronic ulcer of other part of right foot with unspecified severity: Secondary | ICD-10-CM | POA: Diagnosis not present

## 2014-09-05 DIAGNOSIS — I739 Peripheral vascular disease, unspecified: Secondary | ICD-10-CM | POA: Diagnosis not present

## 2014-09-05 DIAGNOSIS — E1151 Type 2 diabetes mellitus with diabetic peripheral angiopathy without gangrene: Secondary | ICD-10-CM | POA: Diagnosis not present

## 2014-09-05 DIAGNOSIS — L603 Nail dystrophy: Secondary | ICD-10-CM | POA: Diagnosis not present

## 2014-09-05 DIAGNOSIS — L84 Corns and callosities: Secondary | ICD-10-CM | POA: Diagnosis not present

## 2014-09-11 ENCOUNTER — Ambulatory Visit (HOSPITAL_COMMUNITY)
Admission: RE | Admit: 2014-09-11 | Discharge: 2014-09-11 | Disposition: A | Discharge status: Home / Self Care | Payer: Medicare Other | Source: Ambulatory Visit | Attending: Cardiology | Admitting: Cardiology

## 2014-09-11 DIAGNOSIS — I998 Other disorder of circulatory system: Secondary | ICD-10-CM

## 2014-09-16 DIAGNOSIS — L97529 Non-pressure chronic ulcer of other part of left foot with unspecified severity: Secondary | ICD-10-CM | POA: Diagnosis not present

## 2014-09-16 DIAGNOSIS — L97519 Non-pressure chronic ulcer of other part of right foot with unspecified severity: Secondary | ICD-10-CM | POA: Diagnosis not present

## 2014-10-28 ENCOUNTER — Emergency Department (HOSPITAL_COMMUNITY): Payer: PPO

## 2014-10-28 ENCOUNTER — Encounter (HOSPITAL_COMMUNITY): Payer: Self-pay | Admitting: Family Medicine

## 2014-10-28 ENCOUNTER — Ambulatory Visit (INDEPENDENT_AMBULATORY_CARE_PROVIDER_SITE_OTHER): Payer: Medicare Other | Admitting: Podiatry

## 2014-10-28 ENCOUNTER — Encounter: Payer: Self-pay | Admitting: Podiatry

## 2014-10-28 ENCOUNTER — Inpatient Hospital Stay (HOSPITAL_COMMUNITY)
Admission: EM | Admit: 2014-10-28 | Discharge: 2014-11-01 | DRG: 254 | Disposition: A | Discharge status: Home / Self Care | Payer: PPO | Attending: Internal Medicine | Admitting: Internal Medicine

## 2014-10-28 VITALS — BP 159/72 | HR 76 | Temp 96.7°F | Resp 18 | Ht 72.0 in | Wt 238.0 lb

## 2014-10-28 DIAGNOSIS — Z8673 Personal history of transient ischemic attack (TIA), and cerebral infarction without residual deficits: Secondary | ICD-10-CM

## 2014-10-28 DIAGNOSIS — E08621 Diabetes mellitus due to underlying condition with foot ulcer: Secondary | ICD-10-CM | POA: Diagnosis not present

## 2014-10-28 DIAGNOSIS — Z833 Family history of diabetes mellitus: Secondary | ICD-10-CM

## 2014-10-28 DIAGNOSIS — Z87891 Personal history of nicotine dependence: Secondary | ICD-10-CM

## 2014-10-28 DIAGNOSIS — I70235 Atherosclerosis of native arteries of right leg with ulceration of other part of foot: Secondary | ICD-10-CM | POA: Diagnosis present

## 2014-10-28 DIAGNOSIS — E11621 Type 2 diabetes mellitus with foot ulcer: Secondary | ICD-10-CM | POA: Diagnosis present

## 2014-10-28 DIAGNOSIS — E1142 Type 2 diabetes mellitus with diabetic polyneuropathy: Secondary | ICD-10-CM | POA: Diagnosis present

## 2014-10-28 DIAGNOSIS — Z8249 Family history of ischemic heart disease and other diseases of the circulatory system: Secondary | ICD-10-CM

## 2014-10-28 DIAGNOSIS — Z79899 Other long term (current) drug therapy: Secondary | ICD-10-CM | POA: Diagnosis not present

## 2014-10-28 DIAGNOSIS — L97529 Non-pressure chronic ulcer of other part of left foot with unspecified severity: Secondary | ICD-10-CM | POA: Diagnosis present

## 2014-10-28 DIAGNOSIS — E785 Hyperlipidemia, unspecified: Secondary | ICD-10-CM | POA: Diagnosis present

## 2014-10-28 DIAGNOSIS — E1159 Type 2 diabetes mellitus with other circulatory complications: Secondary | ICD-10-CM

## 2014-10-28 DIAGNOSIS — I739 Peripheral vascular disease, unspecified: Secondary | ICD-10-CM | POA: Diagnosis present

## 2014-10-28 DIAGNOSIS — Z7982 Long term (current) use of aspirin: Secondary | ICD-10-CM

## 2014-10-28 DIAGNOSIS — L98493 Non-pressure chronic ulcer of skin of other sites with necrosis of muscle: Secondary | ICD-10-CM | POA: Diagnosis not present

## 2014-10-28 DIAGNOSIS — L98499 Non-pressure chronic ulcer of skin of other sites with unspecified severity: Secondary | ICD-10-CM | POA: Diagnosis present

## 2014-10-28 DIAGNOSIS — Z7902 Long term (current) use of antithrombotics/antiplatelets: Secondary | ICD-10-CM

## 2014-10-28 DIAGNOSIS — E039 Hypothyroidism, unspecified: Secondary | ICD-10-CM | POA: Diagnosis present

## 2014-10-28 DIAGNOSIS — E119 Type 2 diabetes mellitus without complications: Secondary | ICD-10-CM

## 2014-10-28 DIAGNOSIS — L03115 Cellulitis of right lower limb: Secondary | ICD-10-CM | POA: Diagnosis not present

## 2014-10-28 DIAGNOSIS — G4733 Obstructive sleep apnea (adult) (pediatric): Secondary | ICD-10-CM | POA: Diagnosis present

## 2014-10-28 DIAGNOSIS — I10 Essential (primary) hypertension: Secondary | ICD-10-CM | POA: Diagnosis present

## 2014-10-28 DIAGNOSIS — E669 Obesity, unspecified: Secondary | ICD-10-CM | POA: Diagnosis present

## 2014-10-28 DIAGNOSIS — L97519 Non-pressure chronic ulcer of other part of right foot with unspecified severity: Secondary | ICD-10-CM

## 2014-10-28 DIAGNOSIS — G629 Polyneuropathy, unspecified: Secondary | ICD-10-CM

## 2014-10-28 DIAGNOSIS — G2581 Restless legs syndrome: Secondary | ICD-10-CM | POA: Diagnosis present

## 2014-10-28 HISTORY — DX: Non-pressure chronic ulcer of skin of other sites with unspecified severity: L98.499

## 2014-10-28 LAB — CBC WITH DIFFERENTIAL/PLATELET
Basophils Absolute: 0.1 10*3/uL (ref 0.0–0.1)
Basophils Relative: 1 % (ref 0–1)
Eosinophils Absolute: 0.1 10*3/uL (ref 0.0–0.7)
Eosinophils Relative: 2 % (ref 0–5)
HCT: 37.1 % — ABNORMAL LOW (ref 39.0–52.0)
Hemoglobin: 12.9 g/dL — ABNORMAL LOW (ref 13.0–17.0)
Lymphocytes Relative: 12 % (ref 12–46)
Lymphs Abs: 0.8 10*3/uL (ref 0.7–4.0)
MCH: 32.3 pg (ref 26.0–34.0)
MCHC: 34.8 g/dL (ref 30.0–36.0)
MCV: 93 fL (ref 78.0–100.0)
Monocytes Absolute: 0.5 10*3/uL (ref 0.1–1.0)
Monocytes Relative: 7 % (ref 3–12)
Neutro Abs: 4.9 10*3/uL (ref 1.7–7.7)
Neutrophils Relative %: 78 % — ABNORMAL HIGH (ref 43–77)
Platelets: 272 10*3/uL (ref 150–400)
RBC: 3.99 MIL/uL — ABNORMAL LOW (ref 4.22–5.81)
RDW: 12.6 % (ref 11.5–15.5)
WBC: 6.3 10*3/uL (ref 4.0–10.5)

## 2014-10-28 LAB — COMPREHENSIVE METABOLIC PANEL
ALT: 15 U/L (ref 0–53)
AST: 18 U/L (ref 0–37)
Albumin: 3.6 g/dL (ref 3.5–5.2)
Alkaline Phosphatase: 90 U/L (ref 39–117)
Anion gap: 8 (ref 5–15)
BUN: 11 mg/dL (ref 6–23)
CO2: 29 mmol/L (ref 19–32)
Calcium: 8.7 mg/dL (ref 8.4–10.5)
Chloride: 100 mmol/L (ref 96–112)
Creatinine, Ser: 0.83 mg/dL (ref 0.50–1.35)
GFR calc Af Amer: >90 mL/min (ref >90)
GFR calc non Af Amer: 86 mL/min — ABNORMAL LOW (ref >90)
Glucose, Bld: 177 mg/dL — ABNORMAL HIGH (ref 70–99)
Potassium: 3.5 mmol/L (ref 3.5–5.1)
Sodium: 137 mmol/L (ref 135–145)
Total Bilirubin: 0.4 mg/dL (ref 0.3–1.2)
Total Protein: 7.3 g/dL (ref 6.0–8.3)

## 2014-10-28 LAB — PROTIME-INR
INR: 1.02 (ref 0.00–1.49)
Prothrombin Time: 13.5 seconds (ref 11.6–15.2)

## 2014-10-28 LAB — SEDIMENTATION RATE: Sed Rate: 37 mm/hr — ABNORMAL HIGH (ref 0–16)

## 2014-10-28 MED ORDER — ASPIRIN EC 81 MG PO TBEC
81.0000 mg | DELAYED_RELEASE_TABLET | Freq: Every day | ORAL | Status: DC
  Administered 2014-10-29 – 2014-10-31 (×3): 81 mg via ORAL
  Filled 2014-10-28 (×3): qty 1

## 2014-10-28 MED ORDER — ACETAMINOPHEN 650 MG RE SUPP: 650.0000 mg | Freq: Four times a day (QID) | RECTAL | Status: DC | PRN

## 2014-10-28 MED ORDER — PANTOPRAZOLE SODIUM 40 MG PO TBEC: 40.0000 mg | DELAYED_RELEASE_TABLET | Freq: Two times a day (BID) | ORAL | Status: DC

## 2014-10-28 MED ORDER — ACETAMINOPHEN 325 MG PO TABS: 650.0000 mg | ORAL_TABLET | Freq: Four times a day (QID) | ORAL | Status: DC | PRN

## 2014-10-28 MED ORDER — ONDANSETRON HCL 4 MG/2ML IJ SOLN: 4.0000 mg | Freq: Four times a day (QID) | INTRAMUSCULAR | Status: DC | PRN

## 2014-10-28 MED ORDER — HYDROCHLOROTHIAZIDE 12.5 MG PO CAPS
12.5000 mg | ORAL_CAPSULE | Freq: Every day | ORAL | Status: DC
  Administered 2014-10-29 – 2014-11-01 (×4): 12.5 mg via ORAL
  Filled 2014-10-28 (×4): qty 1

## 2014-10-28 MED ORDER — ONDANSETRON HCL 4 MG PO TABS: 4.0000 mg | ORAL_TABLET | Freq: Four times a day (QID) | ORAL | Status: DC | PRN

## 2014-10-28 MED ORDER — ROPINIROLE HCL 0.5 MG PO TABS
0.5000 mg | ORAL_TABLET | Freq: Every day | ORAL | Status: DC
  Administered 2014-10-28 – 2014-10-31 (×4): 0.5 mg via ORAL
  Filled 2014-10-28 (×5): qty 1

## 2014-10-28 MED ORDER — CLOPIDOGREL BISULFATE 75 MG PO TABS
75.0000 mg | ORAL_TABLET | Freq: Every day | ORAL | Status: DC
  Administered 2014-10-29 – 2014-11-01 (×3): 75 mg via ORAL
  Filled 2014-10-28 (×4): qty 1

## 2014-10-28 MED ORDER — INSULIN ASPART 100 UNIT/ML ~~LOC~~ SOLN: 0.0000 [IU] | Freq: Every day | SUBCUTANEOUS | Status: DC

## 2014-10-28 MED ORDER — LEVOTHYROXINE SODIUM 25 MCG PO TABS
25.0000 ug | ORAL_TABLET | Freq: Every day | ORAL | Status: DC
  Administered 2014-10-29 – 2014-11-01 (×4): 25 ug via ORAL
  Filled 2014-10-28 (×5): qty 1

## 2014-10-28 MED ORDER — CLINDAMYCIN PHOSPHATE 600 MG/50ML IV SOLN
600.0000 mg | Freq: Three times a day (TID) | INTRAVENOUS | Status: DC
  Administered 2014-10-28 – 2014-11-01 (×12): 600 mg via INTRAVENOUS
  Filled 2014-10-28 (×17): qty 50

## 2014-10-28 MED ORDER — DULOXETINE HCL 20 MG PO CPEP
20.0000 mg | ORAL_CAPSULE | Freq: Two times a day (BID) | ORAL | Status: DC
  Administered 2014-10-29 – 2014-11-01 (×7): 20 mg via ORAL
  Filled 2014-10-28 (×8): qty 1

## 2014-10-28 MED ORDER — CLINDAMYCIN PHOSPHATE 600 MG/50ML IV SOLN
600.0000 mg | Freq: Once | INTRAVENOUS | Status: AC
  Administered 2014-10-28: 600 mg via INTRAVENOUS
  Filled 2014-10-28: qty 50

## 2014-10-28 MED ORDER — LOSARTAN POTASSIUM-HCTZ 100-12.5 MG PO TABS: 1.0000 | ORAL_TABLET | Freq: Every day | ORAL | Status: DC

## 2014-10-28 MED ORDER — PRAVASTATIN SODIUM 40 MG PO TABS
40.0000 mg | ORAL_TABLET | Freq: Every day | ORAL | Status: DC
  Administered 2014-10-29 – 2014-11-01 (×4): 40 mg via ORAL
  Filled 2014-10-28 (×4): qty 1

## 2014-10-28 MED ORDER — LOSARTAN POTASSIUM 50 MG PO TABS
100.0000 mg | ORAL_TABLET | Freq: Every day | ORAL | Status: DC
  Administered 2014-10-29 – 2014-11-01 (×4): 100 mg via ORAL
  Filled 2014-10-28 (×4): qty 2

## 2014-10-28 MED ORDER — AMLODIPINE BESYLATE 10 MG PO TABS
10.0000 mg | ORAL_TABLET | Freq: Every day | ORAL | Status: DC
  Administered 2014-10-29 – 2014-11-01 (×4): 10 mg via ORAL
  Filled 2014-10-28 (×4): qty 1

## 2014-10-28 MED ORDER — INSULIN ASPART 100 UNIT/ML ~~LOC~~ SOLN
0.0000 [IU] | Freq: Three times a day (TID) | SUBCUTANEOUS | Status: DC
  Administered 2014-10-29: 2 [IU] via SUBCUTANEOUS
  Administered 2014-10-29: 5 [IU] via SUBCUTANEOUS
  Administered 2014-10-29 – 2014-11-01 (×7): 3 [IU] via SUBCUTANEOUS

## 2014-10-28 MED ORDER — GABAPENTIN 600 MG PO TABS
600.0000 mg | ORAL_TABLET | Freq: Four times a day (QID) | ORAL | Status: DC
  Administered 2014-10-28 – 2014-11-01 (×13): 600 mg via ORAL
  Filled 2014-10-28 (×19): qty 1

## 2014-10-28 MED ORDER — AMLODIPINE BESYLATE 10 MG PO TABS: 10.0000 mg | ORAL_TABLET | Freq: Every day | ORAL | Status: DC

## 2014-10-28 MED ORDER — HEPARIN SODIUM (PORCINE) 5000 UNIT/ML IJ SOLN
5000.0000 [IU] | Freq: Three times a day (TID) | INTRAMUSCULAR | Status: DC
  Administered 2014-10-28 – 2014-10-30 (×6): 5000 [IU] via SUBCUTANEOUS
  Filled 2014-10-28 (×12): qty 1

## 2014-10-28 MED ORDER — CLOPIDOGREL BISULFATE 75 MG PO TABS: 75.0000 mg | ORAL_TABLET | Freq: Every day | ORAL | Status: DC

## 2014-10-28 NOTE — ED Provider Notes (Signed)
CSN: [id number]     Arrival date & time 10/28/14  1425 History   First MD Initiated Contact with Patient 10/28/14 1655     Chief Complaint  Patient presents with  . Cellulitis     (Consider location/radiation/quality/duration/timing/severity/associated sxs/prior Treatment) HPI 69 year old male past history as below notable for diabetes, peripheral artery disease, chronic diabetic neuropathy presents to ED from clinic for further evaluation of chronic nonhealing ulcers on bilateral feet. Patient reports he has had these ulcers for the past 6 months moving slowly worsening during this time. Patient sees a podiatrist who is been primarily caring for these with topical agents. Patient denies having any recent fevers, but does state having intermittent chills from time to time but none recently. He denies any nausea or vomiting. States his feet have been slightly swollen over the last couple of weeks. He notes having open lesions which are unchanged currently. Patient also reports having small amount of redness around his big toe on the right foot. Patient also states he has severe chronic peripheral neuropathy pain. Patient says he was advised to come to the ED for IV antibiotics. He also states he is being set up for follow-up with the wound clinic here at [facility]. No other complaints at this time.  Past Medical History  Diagnosis Date  . Hypertension   . Diabetes mellitus   . Carotid artery occlusion   . Stroke March 08, 2012  . Hyperlipidemia   . Peripheral arterial disease     nonhealing ulcers bilaterally on each great toe  . PONV (postoperative nausea and vomiting)   . Arthritis   . Hypothyroidism   . Shortness of breath   . Obstructive sleep apnea    Past Surgical History  Procedure Laterality Date  . Back surgery    . Cervical fusion    . Foot surgery    . Spine surgery    . Eye surgery    . Endarterectomy Left 11/29/2013    Procedure: ENDARTERECTOMY CAROTID;  Surgeon: Serafina Mitchell, MD;  Location: [facility] OR;  Service: Vascular;  Laterality: Left;  . Carotid endarterectomy Left   . Pv angiogram  02/18/2014    tibial vessel diseas bil.  . Angioplasty  02/28/14    diamond back orbital rotational atherectomy of Rt. tibial  . Lower ext duplex doppler  03/14/14    Rt ABI 1.2  . Lower extremity angiogram Bilateral 02/18/2014    Procedure: LOWER EXTREMITY ANGIOGRAM;  Surgeon: [name], MD;  Location: [facility] CATH LAB;  Service: Cardiovascular;  Laterality: Bilateral;   Family History  Problem Relation Age of Onset  . Heart disease Mother   . Hypertension Mother   . Heart attack Mother   . Diabetes Son   . Heart disease Son   . Hypertension Son   . Hypertension Father   . Diabetes Father    History  Substance Use Topics  . Smoking status: Former Smoker -- 1 years    Types: Pipe    Quit date: 07/08/1977  . Smokeless tobacco: Never Used  . Alcohol Use: No    Review of Systems  Constitutional: Negative for fever, activity change and appetite change.  HENT: Negative for congestion, rhinorrhea and sore throat.   Eyes: Negative for visual disturbance.  Respiratory: Negative for cough and shortness of breath.   Cardiovascular: Negative for chest pain, palpitations and leg swelling.  Gastrointestinal: Negative for nausea, vomiting, abdominal pain and diarrhea.  Genitourinary: Negative for dysuria, flank pain,  decreased urine volume and difficulty urinating.  Musculoskeletal: Negative for back pain, joint swelling, gait problem and neck pain.  Skin: Positive for wound. Negative for rash.  Neurological: Negative for dizziness, syncope, speech difficulty, weakness, light-headedness, numbness and headaches.  Psychiatric/Behavioral: Negative for confusion.      Allergies  Codeine and Tramadol  Home Medications   Prior to Admission medications   Medication Sig Start Date End Date Taking? Authorizing Provider  amLODipine (NORVASC) 10 MG tablet Take 10 mg by mouth  daily.  05/28/14  Yes Historical Provider, MD  aspirin 81 MG tablet Take 81 mg by mouth daily.   Yes Historical Provider, MD  b complex vitamins tablet Take 1 tablet by mouth daily.   Yes Historical Provider, MD  clopidogrel (PLAVIX) 75 MG tablet Take 75 mg by mouth daily.  06/16/13  Yes Historical Provider, MD  Dapagliflozin Propanediol (FARXIGA) 5 MG TABS Take 5 mg by mouth every evening.    Yes Historical Provider, MD  DULoxetine (CYMBALTA) 20 MG capsule Take 1 capsule by mouth 2 (two) times daily. 07/30/14  Yes Historical Provider, MD  Empagliflozin (JARDIANCE) 10 MG TABS Take 10 mg by mouth daily.   Yes Historical Provider, MD  gabapentin (NEURONTIN) 600 MG tablet Take 600 mg by mouth 4 (four) times daily.    Yes Historical Provider, MD  glimepiride (AMARYL) 4 MG tablet Take 4 mg by mouth daily with breakfast.  11/23/13  Yes Historical Provider, MD  levothyroxine (SYNTHROID, LEVOTHROID) 25 MCG tablet Take 25 mcg by mouth daily.  06/08/13  Yes Historical Provider, MD  losartan-hydrochlorothiazide (HYZAAR) 100-12.5 MG per tablet Take 1 tablet by mouth daily.   Yes Historical Provider, MD  Magnesium 250 MG TABS Take 250 mg by mouth daily.    Yes Historical Provider, MD  Melatonin 5 MG TABS Take 10 mg by mouth at bedtime.    Yes Historical Provider, MD  Misc Natural Products (OSTEO BI-FLEX JOINT SHIELD PO) Take 1 tablet by mouth 2 (two) times daily.   Yes Historical Provider, MD  Multiple Vitamins-Minerals (ONE-A-DAY 50 PLUS PO) Take 1 tablet by mouth daily.   Yes Historical Provider, MD  pantoprazole (PROTONIX) 40 MG tablet Take 40 mg by mouth 2 (two) times daily.   Yes Historical Provider, MD  pravastatin (PRAVACHOL) 40 MG tablet Take 40 mg by mouth every morning.  10/06/12  Yes Historical Provider, MD  rOPINIRole (REQUIP) 0.5 MG tablet Take 1 tablet (0.5 mg total) by mouth at bedtime. 10/29/13  Yes Luke K Kilroy, PA-C  sitaGLIPtin (JANUVIA) 100 MG tablet Take 100 mg by mouth every evening.    Yes  Historical Provider, MD  aspirin EC 325 MG EC tablet Take 1 tablet (325 mg total) by mouth daily. Patient not taking: Reported on 10/28/2014 03/01/14   Brett Canales, PA-C  oxyCODONE (ROXICODONE) 5 MG immediate release tablet Take 1 tablet (5 mg total) by mouth every 6 (six) hours as needed for severe pain. Patient not taking: Reported on 10/28/2014 11/30/13   Samantha J Rhyne, PA-C   BP 153/73 mmHg  Pulse 79  Temp(Src) 98 F (36.7 C)  Resp 18  SpO2 94% Physical Exam  Constitutional: He is oriented to person, place, and time. He appears well-developed and well-nourished. No distress.  HENT:  Head: Normocephalic and atraumatic.  Nose: Nose normal.  Mouth/Throat: Oropharynx is clear and moist. No oropharyngeal exudate.  Eyes: Conjunctivae and EOM are normal.  Neck: Normal range of motion. Neck supple. No JVD present.  Cardiovascular: Normal rate, regular rhythm, normal heart sounds and intact distal pulses.   Pulmonary/Chest: Effort normal and breath sounds normal. No respiratory distress.  Abdominal: Soft. He exhibits no distension. There is no tenderness. There is no rebound and no guarding.  Musculoskeletal: Normal range of motion.  Neurological: He is alert and oriented to person, place, and time. No cranial nerve deficit.  Skin: Skin is warm and dry. No rash noted.  R great toe region has mild erythema and TTP with deep open chronic appearing ulcerative lesion to bottom of base 1st MTP joint. No crepitus in foot. No spreading of erythema up foot. Dec sensation (normal per pt). NVI distally o/w.  L great toe has chronic appearing shallow ulcerative open lesion to dorsum of phalanx. Mild erythema immediately surrounding great toe. No spreading of erythema up foot. Dec sensation (normal per pt). NVI distally o/w.  Psychiatric: He has a normal mood and affect.  Nursing note and vitals reviewed.   ED Course  Procedures (including critical care time) Labs Review Labs Reviewed  CBC WITH  DIFFERENTIAL/PLATELET - Abnormal; Notable for the following:    RBC 3.99 (*)    Hemoglobin 12.9 (*)    HCT 37.1 (*)    Neutrophils Relative % 78 (*)    All other components within normal limits  SEDIMENTATION RATE - Abnormal; Notable for the following:    Sed Rate 37 (*)    All other components within normal limits  COMPREHENSIVE METABOLIC PANEL  PROTIME-INR  HEMOGLOBIN A1C  C-REACTIVE PROTEIN    Imaging Review Dg Foot 2 Views Right  10/28/2014   CLINICAL DATA:  Right-sided cellulitis. Wound on the bottom of the right foot on the medial aspect for 6 months.  EXAM: RIGHT FOOT - 2 VIEW  COMPARISON:  06/03/2008  FINDINGS: Postoperative changes with previous arthrodesis and screw fixation at the right first metatarsal phalangeal joint. Lucencies and sclerosis in the mid shafts of the proximal phalanges at the second and third toes, probably old postoperative change from pin tract. Degenerative changes throughout the interphalangeal joints and intertarsal joints. Degenerative changes in the ankle. Small plantar and Achilles calcaneal spurs. The lateral views demonstrates soft tissue swelling and soft tissue defect over the metatarsal head region consistent with numb chronic wound. No definite evidence of any underlying bone destruction or erosion. No plain film findings to suggest osteomyelitis. No acute fracture or dislocation.  IMPRESSION: Postoperative and degenerative changes as discussed. Soft tissue ulceration along the plantar aspect of the metatarsal head region. No definite evidence of osteomyelitis.   Electronically Signed   By: Lucienne Capers M.D.   On: 10/28/2014 18:23     EKG Interpretation None      MDM   Final diagnoses:  None    OLA FAWVER is a 69 y.o. male with H&P as above. -Pertinent historical findings: 69 year old male with diabetes and peripheral artery disease sent from clinic for cellulitis management. -Initial impression: Mild cellulitis to right great toe  and smaller band erythema questionable for cellulitis around L great toe. Dopplerable DPs BL. Patient is afebrile in ED. Patient has chronic appearing ulcerative lesions as described above. Clinic records reviewed and PCP is setting patient up with wound care. IV clindamycin has been given in the ED. Patient's labs notable for 6k WBC and ESR slightly elevated. Plain films without signs of osteo. Given pt significant risk factors for outpt tx failure, pt will be admitted to Hospitalist.  Pt seen in conjunction with Dr. Wyvonnia Dusky  Kirstie Peri, [place] [facility] Resident - PGY-2      Kirstie Peri, MD 10/28/14 2248  Ezequiel Essex, MD 10/29/14 [phone]

## 2014-10-28 NOTE — Progress Notes (Signed)
Pt refusing cpap at this time, stating that he hasnt been wearing it every night at home. Encouraged pt to call if he changed his mind.

## 2014-10-28 NOTE — Patient Instructions (Signed)
I am referring this patient immediately to the emergency room room for a diabetic foot ulceration/cellulitis and a history of peripheral arterial disease

## 2014-10-28 NOTE — ED Notes (Signed)
Per pt sts cellulitis in both feet. sts sent here by doctor for IV abx.

## 2014-10-28 NOTE — Progress Notes (Signed)
   Subjective:    Patient ID: [name], male    DOB: 1946/05/10, 69 y.o.   MRN: [id number]  HPI Comments: "I have these ulcers that are not healing"  N - tender, patient does have neuropathy L - Sub 1st MPJ right and sub 1st toe left  D - 6 months O - gradual C - ulcerations, redness, some draining, swelling A - walking (pressure)  T - Dr. Barkley Bruns treating-using amerigel and bandaging, not healing, worse   this patient presents with increasing odor and drainage from the right foot in the past 2 days. He describes approxi-6 months of topical treatment by another podiatrist prior to presentation today.   He describes approxi-1 year history of local treatment to the left hallux skin ulcer without healing  He relates a history of peripheral arterial disease with stenting and reocclusion on right lower extremity    Review of Systems  Endocrine: Positive for polyphagia and polyuria.  Musculoskeletal: Positive for myalgias and gait problem.  Skin: Positive for wound.  Hematological: Bruises/bleeds easily.  All other systems reviewed and are negative.      Objective:   Physical Exam orientated 3 Oral temp 96.7 Fahrenheit  Vascular: DP pulses 1/4 bilaterally PT pulse 1/4 bilaterally  Neurological: Sensation to 10 g monofilament wire intact 2/5 right and 4/5 left Vibratory sensation nonreactive bilaterally Ankle reflexes reactive bilaterally  Dermatological: Right foot Full dermal ulceration plantar right first MPJ with macerated tissue surrounding the ulcer which is approximate 20 mm in diameter, however, the area was not debrided. Erythema surrounds this medially and proximally. There is slight malodor noted from the drainage site. Well-healed surgical scar second right toe  Left foot 10 mm dermal ulceration plantar left hallux surrounded by hyperkeratotic tissue. There is low-grade erythema in the left hallux without any active drainage  Musculoskeletal: No  deformities noted        Assessment & Plan:   Assessment: History of peripheral arterial disease Diabetic peripheral neuropathy Ulceration and cellulitis right foot that needs immediate antibiotic therapy most likely IV The right foot is the more dominant problem and needs immediate referral to the ER  Left hallux demonstrates ulceration with low-grade cellulitis  Plan: Advised patient today to present immediately to emergency room at [facility] for the primary reason of ulceration cellulitis right foot with a history of peripheral arterial disease right lower extremity.  Upon discharge from hospital for infection control I'm recommending patient is referred to the wound care center at [facility]    Patient will leave office and present to ER today

## 2014-10-28 NOTE — H&P (Signed)
[facility] History and Physical  Patient: [name]  MRN: [id number]  DOB: 07-Jun-1946  DOS: the patient was seen and examined on 10/28/2014 PCP: [name], MD  Chief Complaint: Nonhealing ulcer on the foot  HPI: [name] is a 69 y.o. male with Past medical history of hypertension, diabetes mellitus, peripheral vascular disease including carotid sent bilateral lower extremity vessels, suggesting sleep apnea, hypothyroidism, obesity. The patient is a presenting with complaints of nonhealing ulcer on foot. Patient has been following up with the podiatry for last 6 months with topical treatment. Over last 2 days patient has noted increasing odor and drainage from the right foot. Patient also mentions over last 1 month he has noted a red area of discoloration on the left great toe which was not present earlier. Patient has been complaining of some pain on ambulation but does not have any pain at rest. Pain is primarily located on the plantar surface of the foot and feels like a burning pain. Patient denies any fever chills nausea vomiting diarrhea or burning urination abdominal pain or chest pain. Patient mentions he is compliant with all his medication and has not been on any antibiotic for this ulceration. In the past patient has developed a nonhealing ulcer on the right foot which required vascular atherectomy. He is ABI was reportedly worsening as well.  The patient is coming from home. And at his baseline independent for most of his ADL.  Review of Systems: as mentioned in the history of present illness.  A Comprehensive review of the other systems is negative.  Past Medical History  Diagnosis Date  . Hypertension   . Diabetes mellitus   . Carotid artery occlusion   . Stroke March 08, 2012  . Hyperlipidemia   . Peripheral arterial disease     nonhealing ulcers bilaterally on each great toe  . PONV (postoperative nausea and vomiting)   . Arthritis   .  Hypothyroidism   . Shortness of breath   . Obstructive sleep apnea    Past Surgical History  Procedure Laterality Date  . Back surgery    . Cervical fusion    . Foot surgery    . Spine surgery    . Eye surgery    . Endarterectomy Left 11/29/2013    Procedure: ENDARTERECTOMY CAROTID;  Surgeon: Serafina Mitchell, MD;  Location: [facility] OR;  Service: Vascular;  Laterality: Left;  . Carotid endarterectomy Left   . Pv angiogram  02/18/2014    tibial vessel diseas bil.  . Angioplasty  02/28/14    diamond back orbital rotational atherectomy of Rt. tibial  . Lower ext duplex doppler  03/14/14    Rt ABI 1.2  . Lower extremity angiogram Bilateral 02/18/2014    Procedure: LOWER EXTREMITY ANGIOGRAM;  Surgeon: [name], MD;  Location: [facility] CATH LAB;  Service: Cardiovascular;  Laterality: Bilateral;   Social History:  reports that he quit smoking about 37 years ago. His smoking use included Pipe. He has never used smokeless tobacco. He reports that he does not drink alcohol or use illicit drugs.  Allergies  Allergen Reactions  . Codeine Nausea And Vomiting  . Tramadol Nausea Only    Family History  Problem Relation Age of Onset  . Heart disease Mother   . Hypertension Mother   . Heart attack Mother   . Diabetes Son   . Heart disease Son   . Hypertension Son   . Hypertension Father   . Diabetes Father  Prior to Admission medications   Medication Sig Start Date End Date Taking? Authorizing Provider  amLODipine (NORVASC) 10 MG tablet Take 10 mg by mouth daily.  05/28/14  Yes Historical Provider, MD  aspirin 81 MG tablet Take 81 mg by mouth daily.   Yes Historical Provider, MD  b complex vitamins tablet Take 1 tablet by mouth daily.   Yes Historical Provider, MD  clopidogrel (PLAVIX) 75 MG tablet Take 75 mg by mouth daily.  06/16/13  Yes Historical Provider, MD  Dapagliflozin Propanediol (FARXIGA) 5 MG TABS Take 5 mg by mouth every evening.    Yes Historical Provider, MD  DULoxetine  (CYMBALTA) 20 MG capsule Take 1 capsule by mouth 2 (two) times daily. 07/30/14  Yes Historical Provider, MD  Empagliflozin (JARDIANCE) 10 MG TABS Take 10 mg by mouth daily.   Yes Historical Provider, MD  gabapentin (NEURONTIN) 600 MG tablet Take 600 mg by mouth 4 (four) times daily.    Yes Historical Provider, MD  glimepiride (AMARYL) 4 MG tablet Take 4 mg by mouth daily with breakfast.  11/23/13  Yes Historical Provider, MD  levothyroxine (SYNTHROID, LEVOTHROID) 25 MCG tablet Take 25 mcg by mouth daily.  06/08/13  Yes Historical Provider, MD  losartan-hydrochlorothiazide (HYZAAR) 100-12.5 MG per tablet Take 1 tablet by mouth daily.   Yes Historical Provider, MD  Magnesium 250 MG TABS Take 250 mg by mouth daily.    Yes Historical Provider, MD  Melatonin 5 MG TABS Take 10 mg by mouth at bedtime.    Yes Historical Provider, MD  Misc Natural Products (OSTEO BI-FLEX JOINT SHIELD PO) Take 1 tablet by mouth 2 (two) times daily.   Yes Historical Provider, MD  Multiple Vitamins-Minerals (ONE-A-DAY 50 PLUS PO) Take 1 tablet by mouth daily.   Yes Historical Provider, MD  pantoprazole (PROTONIX) 40 MG tablet Take 40 mg by mouth 2 (two) times daily.   Yes Historical Provider, MD  pravastatin (PRAVACHOL) 40 MG tablet Take 40 mg by mouth every morning.  10/06/12  Yes Historical Provider, MD  rOPINIRole (REQUIP) 0.5 MG tablet Take 1 tablet (0.5 mg total) by mouth at bedtime. 10/29/13  Yes Luke K Kilroy, PA-C  sitaGLIPtin (JANUVIA) 100 MG tablet Take 100 mg by mouth every evening.    Yes Historical Provider, MD  aspirin EC 325 MG EC tablet Take 1 tablet (325 mg total) by mouth daily. Patient not taking: Reported on 10/28/2014 03/01/14   Brett Canales, PA-C  oxyCODONE (ROXICODONE) 5 MG immediate release tablet Take 1 tablet (5 mg total) by mouth every 6 (six) hours as needed for severe pain. Patient not taking: Reported on 10/28/2014 11/30/13   Gabriel Earing, PA-C    Physical Exam: Filed Vitals:   10/28/14 1915 10/28/14  1933 10/28/14 1945 10/28/14 2045  BP: 158/82 150/82 163/74 159/63  Pulse:  75 72   Temp:      Resp:  21    SpO2:  95% 93%     General: Alert, Awake and Oriented to Time, Place and Person. Appear in mild distress Eyes: PERRL ENT: Oral Mucosa clear moist. Neck: no JVD Cardiovascular: S1 and S2 Present, no Murmur, Peripheral Pulses Present Respiratory: Bilateral Air entry equal and Decreased, Clear to Auscultation, noCrackles, no wheezes Abdomen: Bowel Sound presnt, Soft and no tender Skin: Hyperkalemia noted on the left great toe in a bandlike fashion Open nonhealing ulcer noted on the plantar surface of the right foot with slough in the surrounding area no other Rash  Extremities: Trace right more than left Pedal edema, no calf tenderness Neurologic: Grossly no focal neuro deficit.  Labs on Admission:  CBC:  Recent Labs Lab 10/28/14 1800  WBC 6.3  NEUTROABS 4.9  HGB 12.9*  HCT 37.1*  MCV 93.0  PLT 272    CMP     Component Value Date/Time   NA 139 03/01/2014 0318   K 3.9 03/01/2014 0318   CL 101 03/01/2014 0318   CO2 25 03/01/2014 0318   GLUCOSE 157* 03/01/2014 0318   BUN 12 03/01/2014 0318   CREATININE 0.87 03/01/2014 0318   CREATININE 1.11 02/12/2014 1612   CALCIUM 8.4 03/01/2014 0318   PROT 7.1 11/23/2013 1529   ALBUMIN 4.0 11/23/2013 1529   AST 16 11/23/2013 1529   ALT 15 11/23/2013 1529   ALKPHOS 71 11/23/2013 1529   BILITOT 0.5 11/23/2013 1529   GFRNONAA 85* 03/01/2014 0318   GFRAA >90 03/01/2014 0318    No results for input(s): LIPASE, AMYLASE in the last 168 hours.  No results for input(s): CKTOTAL, CKMB, CKMBINDEX, TROPONINI in the last 168 hours. BNP (last 3 results) No results for input(s): BNP in the last 8760 hours.  ProBNP (last 3 results) No results for input(s): PROBNP in the last 8760 hours.   Radiological Exams on Admission: Dg Foot 2 Views Right  10/28/2014   CLINICAL DATA:  Right-sided cellulitis. Wound on the bottom of the right  foot on the medial aspect for 6 months.  EXAM: RIGHT FOOT - 2 VIEW  COMPARISON:  06/03/2008  FINDINGS: Postoperative changes with previous arthrodesis and screw fixation at the right first metatarsal phalangeal joint. Lucencies and sclerosis in the mid shafts of the proximal phalanges at the second and third toes, probably old postoperative change from pin tract. Degenerative changes throughout the interphalangeal joints and intertarsal joints. Degenerative changes in the ankle. Small plantar and Achilles calcaneal spurs. The lateral views demonstrates soft tissue swelling and soft tissue defect over the metatarsal head region consistent with numb chronic wound. No definite evidence of any underlying bone destruction or erosion. No plain film findings to suggest osteomyelitis. No acute fracture or dislocation.  IMPRESSION: Postoperative and degenerative changes as discussed. Soft tissue ulceration along the plantar aspect of the metatarsal head region. No definite evidence of osteomyelitis.   Electronically Signed   By: Lucienne Capers M.D.   On: 10/28/2014 18:23    Assessment/Plan Principal Problem:   Nonhealing skin ulcer Active Problems:   Hypertension   Diabetes mellitus type 2 in obese   Peripheral neuropathy   Restless leg syndrome   Obstructive sleep apnea   PAD (peripheral artery disease)   1. Nonhealing skin ulcer The patient is presenting with a nonhealing skin ulcer. Patient has been seeing a podiatry and a regular basis over last 6 months and the wound has not improved. Patient has establish his care with new podiatrist today and was referred here for further workup. Patient mentions about nonhealing ulcer as well as worsening discharge in order from the wound. Patient also mentions about redness on the left toe without any pain. With his history of peripheral vascular disease the patient will be admitted in the hospital. I would get an ABI as well as vascular Doppler arteriogram in  the morning. It also check CRP. If any worsening then patient may require consultation from vascular surgery. Currently he would be treated with clindamycin.  2. Diabetes mellitus. Check hemoglobin A1c. Continue with insulin sliding scale. Holding oral hypoglycemic agents.  3. Hypertension. Continue home medications.  4. Obstructive sleep apnea. Restless leg syndrome. Continue with C Pap and ropinirole  Advance goals of care discussion: Full code   DVT Prophylaxis: subcutaneous Heparin Nutrition: Nothing by mouth except medication  Disposition: Admitted to inpatient in med-surge unit.  Author: Berle Mull, MD [facility] Pager: [phone] 10/28/2014, 9:09 PM    If 7PM-7AM, please contact night-coverage [email or web address] Password [email or web address]

## 2014-10-28 NOTE — Progress Notes (Signed)
ERSKIN ZINDA [id number] Admission Data: 10/28/2014 11:47 PM Attending Provider: Berle Mull, MD STM:[id number] L, MD Code Status: Full  ELZIA HOTT is a 69 y.o. male patient admitted from ED:  -No acute distress noted.  -No complaints of shortness of breath.  -No complaints of chest pain.   Cardiac Monitoring: Box #  in place. Cardiac monitor yields:n/a.  Blood pressure 176/93, pulse 72, temperature 98.4 F (36.9 C), temperature source Oral, resp. rate 18, SpO2 96 %.   IV Fluids:  IV in place, occlusive dsg intact without redness, IV cath antecubital left, condition patent and no redness none.   Allergies:  Codeine and Tramadol  Past Medical History:   has a past medical history of Hypertension; Diabetes mellitus; Carotid artery occlusion; Stroke (March 08, 2012); Hyperlipidemia; Peripheral arterial disease; PONV (postoperative nausea and vomiting); Arthritis; Hypothyroidism; Shortness of breath; and Obstructive sleep apnea.  Past Surgical History:   has past surgical history that includes Back surgery; Cervical fusion; Foot surgery; Spine surgery; Eye surgery; Endarterectomy (Left, 11/29/2013); Carotid endarterectomy (Left); PV angiogram (02/18/2014); Angioplasty (02/28/14); Lower ext duplex doppler (03/14/14); and lower extremity angiogram (Bilateral, 02/18/2014).  Social History:   reports that he quit smoking about 37 years ago. His smoking use included Pipe. He has never used smokeless tobacco. He reports that he does not drink alcohol or use illicit drugs.  Skin: charted on CHL  Patient/Family orientated to room. Information packet given to patient/family. Admission inpatient armband information verified with patient/family to include name and date of birth and placed on patient arm. Side rails up x 2, fall assessment and education completed with patient/family. Patient/family able to verbalize understanding of risk associated with falls and verbalized understanding to call  for assistance before getting out of bed. Call light within reach. Patient/family able to voice and demonstrate understanding of unit orientation instructions.

## 2014-10-29 DIAGNOSIS — L98491 Non-pressure chronic ulcer of skin of other sites limited to breakdown of skin: Secondary | ICD-10-CM

## 2014-10-29 LAB — COMPREHENSIVE METABOLIC PANEL
ALT: 15 U/L (ref 0–53)
AST: 17 U/L (ref 0–37)
Albumin: 3.6 g/dL (ref 3.5–5.2)
Alkaline Phosphatase: 85 U/L (ref 39–117)
Anion gap: 8 (ref 5–15)
BUN: 10 mg/dL (ref 6–23)
CO2: 24 mmol/L (ref 19–32)
Calcium: 9 mg/dL (ref 8.4–10.5)
Chloride: 107 mmol/L (ref 96–112)
Creatinine, Ser: 0.72 mg/dL (ref 0.50–1.35)
GFR calc Af Amer: >90 mL/min (ref >90)
GFR calc non Af Amer: >90 mL/min (ref >90)
Glucose, Bld: 151 mg/dL — ABNORMAL HIGH (ref 70–99)
Potassium: 3.8 mmol/L (ref 3.5–5.1)
Sodium: 139 mmol/L (ref 135–145)
Total Bilirubin: 0.8 mg/dL (ref 0.3–1.2)
Total Protein: 7.2 g/dL (ref 6.0–8.3)

## 2014-10-29 LAB — CBC WITH DIFFERENTIAL/PLATELET
Basophils Absolute: 0 10*3/uL (ref 0.0–0.1)
Basophils Relative: 1 % (ref 0–1)
Eosinophils Absolute: 0.2 10*3/uL (ref 0.0–0.7)
Eosinophils Relative: 3 % (ref 0–5)
HCT: 40 % (ref 39.0–52.0)
Hemoglobin: 13.6 g/dL (ref 13.0–17.0)
Lymphocytes Relative: 13 % (ref 12–46)
Lymphs Abs: 0.9 10*3/uL (ref 0.7–4.0)
MCH: 32.2 pg (ref 26.0–34.0)
MCHC: 34 g/dL (ref 30.0–36.0)
MCV: 94.6 fL (ref 78.0–100.0)
Monocytes Absolute: 0.4 10*3/uL (ref 0.1–1.0)
Monocytes Relative: 6 % (ref 3–12)
Neutro Abs: 5.1 10*3/uL (ref 1.7–7.7)
Neutrophils Relative %: 77 % (ref 43–77)
Platelets: 301 10*3/uL (ref 150–400)
RBC: 4.23 MIL/uL (ref 4.22–5.81)
RDW: 12.7 % (ref 11.5–15.5)
WBC: 6.5 10*3/uL (ref 4.0–10.5)

## 2014-10-29 LAB — GLUCOSE, CAPILLARY
Glucose-Capillary: 216 mg/dL — ABNORMAL HIGH (ref 70–99)
Glucose-Capillary: 132 mg/dL — ABNORMAL HIGH (ref 70–99)
Glucose-Capillary: 194 mg/dL — ABNORMAL HIGH (ref 70–99)

## 2014-10-29 LAB — C-REACTIVE PROTEIN: CRP: 4 mg/dL — ABNORMAL HIGH (ref <0.60)

## 2014-10-29 MED ORDER — ZOLPIDEM TARTRATE 5 MG PO TABS
5.0000 mg | ORAL_TABLET | Freq: Once | ORAL | Status: AC
  Administered 2014-10-29: 5 mg via ORAL
  Filled 2014-10-29: qty 1

## 2014-10-29 MED ORDER — MUPIROCIN CALCIUM 2 % EX CREA
TOPICAL_CREAM | Freq: Every day | CUTANEOUS | Status: DC
  Administered 2014-10-29 – 2014-11-01 (×4): via TOPICAL
  Filled 2014-10-29 (×2): qty 15

## 2014-10-29 MED ORDER — PRO-STAT SUGAR FREE PO LIQD
30.0000 mL | Freq: Two times a day (BID) | ORAL | Status: DC
  Administered 2014-10-29 – 2014-11-01 (×5): 30 mL via ORAL
  Filled 2014-10-29 (×7): qty 30

## 2014-10-29 NOTE — Consult Note (Addendum)
WOC wound consult note Reason for Consult: Consult requested for bilat foot wounds.  Pt was previously followed by a podiatrist and also previously by VVS team prior to admission, but stated his wounds declined this week and he had increased odor and drainage. ABI results are pending and X-ray did not indicate osteomyelitis. He is on systemic coverage with IV antibiotics at this time. Wound type: Left plantar great toe with full thickness wound .8X.8X.2cm, 100% red and dry wound bed, no odor, scant amt yellow drainage. Right plantar foot with full thickness wound 4X4 cm dry peeling skin surrounding wound bed from previous blister which has ruptured.  Inner wound bed .5X.3X.2cm, 100% red and dry wound bed, no odor, scant amt yellow drainage, no fluctuance. Dressing procedure/placement/frequency: Bactroban to promote moist healing and provide antimicrobial benefits.  Foam dressing to protect from further injury.  Pt can resume follow-up with podiatrist after discharge to trim the nonviable tissue surrounding the wound bed. Discussed plan of care with patient and he denies further questions. Please re-consult if further assistance is needed.  Thank-you,  Julien Girt MSN, [place], [place], [place], [place]

## 2014-10-29 NOTE — Progress Notes (Signed)
INITIAL NUTRITION ASSESSMENT  DOCUMENTATION CODES Per approved criteria  -Obesity Unspecified   INTERVENTION: -Pro stat 30 ml BID providing 200 kcal and 30 g protein  NUTRITION DIAGNOSIS: Inadequate oral intake related to increased nutrient needs as evidenced by diabetic foot ulcers.   Goal: Pt to meed >/= 90% of estimated needs  Monitor:  PO intake, wight trends labs  Reason for Assessment: MST = 2  69 y.o. male  Admitting Dx: Nonhealing skin ulcer  ASSESSMENT: Pt admitted for nonhealing skin ulcers to the right and left feet.  Pt hx of DM, HTN, PVD.  Awaiting HbA1c results.  Pt complains of pain on ambulation and none at rest.    Pt denies changes in appetite and weight loss.  Would like protein supplementation for wound healing.   Height: Ht Readings from Last 1 Encounters:  10/28/14 6' (1.829 m)    Weight: Wt Readings from Last 1 Encounters:  10/28/14 238 lb 5.1 oz (108.1 kg)    Ideal Body Weight: 178 lbs  % Ideal Body Weight: 133%  Wt Readings from Last 10 Encounters:  10/28/14 238 lb 5.1 oz (108.1 kg)  10/28/14 238 lb (107.956 kg)  07/31/14 246 lb 8 oz (111.812 kg)  07/08/14 255 lb (115.667 kg)  06/11/14 253 lb (114.76 kg)  03/19/14 247 lb (112.038 kg)  02/28/14 214 lb 11.7 oz (97.4 kg)  02/18/14 240 lb (108.863 kg)  02/12/14 247 lb 3.2 oz (112.129 kg)  12/10/13 247 lb 12.8 oz (112.401 kg)    Usual Body Weight: 250 lbs   % Usual Body Weight: 95%  BMI:  Body mass index is 32.31 kg/(m^2).  Estimated Nutritional Needs: Kcal: 2000-2200 kcal Protein: 110-130 g protein Fluid: >/= 2L/day  Skin: Diabetic skin ulcers on right and left feet  Diet Order: Heart Healthy/ CHO MOD  EDUCATION NEEDS: -No education needs identified at this time   Intake/Output Summary (Last 24 hours) at 10/29/14 0945 Last data filed at 10/29/14 0515  Gross per 24 hour  Intake    524 ml  Output   1000 ml  Net   -476 ml    Last BM: PTA   Labs:   Recent  Labs Lab 10/28/14 2130  NA 137  K 3.5  CL 100  CO2 29  BUN 11  CREATININE 0.83  CALCIUM 8.7  GLUCOSE 177*    CBG (last 3)  No results for input(s): GLUCAP in the last 72 hours.  Scheduled Meds: . amLODipine  10 mg Oral Daily  . aspirin EC  81 mg Oral Daily  . clindamycin (CLEOCIN) IV  600 mg Intravenous 3 times per day  . clopidogrel  75 mg Oral Daily  . DULoxetine  20 mg Oral BID  . gabapentin  600 mg Oral QID  . heparin  5,000 Units Subcutaneous 3 times per day  . losartan  100 mg Oral Daily   And  . hydrochlorothiazide  12.5 mg Oral Daily  . insulin aspart  0-15 Units Subcutaneous TID WC  . insulin aspart  0-5 Units Subcutaneous QHS  . levothyroxine  25 mcg Oral Daily  . pravastatin  40 mg Oral Daily  . rOPINIRole  0.5 mg Oral QHS    Continuous Infusions:   Past Medical History  Diagnosis Date  . Hypertension   . Diabetes mellitus   . Carotid artery occlusion   . Stroke March 08, 2012  . Hyperlipidemia   . Peripheral arterial disease     nonhealing ulcers bilaterally  on each great toe  . PONV (postoperative nausea and vomiting)   . Arthritis   . Hypothyroidism   . Shortness of breath   . Obstructive sleep apnea     Past Surgical History  Procedure Laterality Date  . Back surgery    . Cervical fusion    . Foot surgery    . Spine surgery    . Eye surgery    . Endarterectomy Left 11/29/2013    Procedure: ENDARTERECTOMY CAROTID;  Surgeon: Serafina Mitchell, MD;  Location: [facility] OR;  Service: Vascular;  Laterality: Left;  . Carotid endarterectomy Left   . Pv angiogram  02/18/2014    tibial vessel diseas bil.  . Angioplasty  02/28/14    diamond back orbital rotational atherectomy of Rt. tibial  . Lower ext duplex doppler  03/14/14    Rt ABI 1.2  . Lower extremity angiogram Bilateral 02/18/2014    Procedure: LOWER EXTREMITY ANGIOGRAM;  Surgeon: [name], MD;  Location: [facility] CATH LAB;  Service: Cardiovascular;  Laterality: Bilateral;    [name] Picker  MS Dietetic Intern Pager Number [phone]

## 2014-10-29 NOTE — Progress Notes (Signed)
[facility] PROGRESS NOTE  [name] XBM:[id number] DOB: 1945/11/23 DOA: 10/28/2014 PCP: [name], MD  Assessment/Plan: 1-Right foot Diabetes wound,PVD;  Continue with IV antibiotics, clindamycin.  Wound care consulted.  Dr Gwenlyn Found Cardiologist consulted. Patient follow with Dr Gwenlyn Found for peripheral Vascular Diseases.  ABI ordered.  Patient S/P  stent posterior tibial right artery 02-2014/P   2-Diabetes with complications. SSI. Hold oral medications.   3-HTN;Cozaar and HCTZ. Norvasc.   4-OSA; CPAP/   5-DVT prophylaxis; heparin.   6-Hypothyroidism; continue with Synthroid.   7-PVD, S/P stent posterior tibial right artery 02-2014 ; continue with aspirin and Plavix.   Code Status: Full Code.  Family Communication: care discussed with patient.  Disposition Plan: remain inpatient. Evaluation for foot wound. Awaiting ABI, and Dr Gwenlyn Found evaluation.   Consultants:  Dr Gwenlyn Found cardiology  Procedures:  ABI; pending.   Antibiotics:  Clindamycin.   HPI/Subjective: Patient relates worsening drainage for last 2 days.  He follows with podiatry./  He denies chest pain or dyspnea. No chills.   Objective: Filed Vitals:   10/29/14 0556  BP: 153/72  Pulse: 78  Temp: 98.7 F (37.1 C)  Resp: 18    Intake/Output Summary (Last 24 hours) at 10/29/14 1032 Last data filed at 10/29/14 0515  Gross per 24 hour  Intake    524 ml  Output   1000 ml  Net   -476 ml   Filed Weights   10/28/14 2206  Weight: 108.1 kg (238 lb 5.1 oz)    Exam:   General:  Alert in no distress, obese.   Cardiovascular: S 1, S 2 RRR, no rubs or murmur.   Respiratory: CTA  Abdomen: BS present, obese, NT  Musculoskeletal: left great toe with open wound with no significant drainage. Right foot with plantar open wound 4x4 cm, surround redness, drainage.   Data Reviewed: Basic Metabolic Panel:  Recent Labs Lab 10/28/14 2130  NA 137  K 3.5  CL 100  CO2 29  GLUCOSE 177*  BUN 11   CREATININE 0.83  CALCIUM 8.7   Liver Function Tests:  Recent Labs Lab 10/28/14 2130  AST 18  ALT 15  ALKPHOS 90  BILITOT 0.4  PROT 7.3  ALBUMIN 3.6   No results for input(s): LIPASE, AMYLASE in the last 168 hours. No results for input(s): AMMONIA in the last 168 hours. CBC:  Recent Labs Lab 10/28/14 1800  WBC 6.3  NEUTROABS 4.9  HGB 12.9*  HCT 37.1*  MCV 93.0  PLT 272   Cardiac Enzymes: No results for input(s): CKTOTAL, CKMB, CKMBINDEX, TROPONINI in the last 168 hours. BNP (last 3 results) No results for input(s): BNP in the last 8760 hours.  ProBNP (last 3 results) No results for input(s): PROBNP in the last 8760 hours.  CBG: No results for input(s): GLUCAP in the last 168 hours.  No results found for this or any previous visit (from the past 240 hour(s)).   Studies: Dg Foot 2 Views Right  10/28/2014   CLINICAL DATA:  Right-sided cellulitis. Wound on the bottom of the right foot on the medial aspect for 6 months.  EXAM: RIGHT FOOT - 2 VIEW  COMPARISON:  06/03/2008  FINDINGS: Postoperative changes with previous arthrodesis and screw fixation at the right first metatarsal phalangeal joint. Lucencies and sclerosis in the mid shafts of the proximal phalanges at the second and third toes, probably old postoperative change from pin tract. Degenerative changes throughout the interphalangeal joints and intertarsal joints. Degenerative changes in the  ankle. Small plantar and Achilles calcaneal spurs. The lateral views demonstrates soft tissue swelling and soft tissue defect over the metatarsal head region consistent with numb chronic wound. No definite evidence of any underlying bone destruction or erosion. No plain film findings to suggest osteomyelitis. No acute fracture or dislocation.  IMPRESSION: Postoperative and degenerative changes as discussed. Soft tissue ulceration along the plantar aspect of the metatarsal head region. No definite evidence of osteomyelitis.    Electronically Signed   By: Lucienne Capers M.D.   On: 10/28/2014 18:23    Scheduled Meds: . amLODipine  10 mg Oral Daily  . aspirin EC  81 mg Oral Daily  . clindamycin (CLEOCIN) IV  600 mg Intravenous 3 times per day  . clopidogrel  75 mg Oral Daily  . DULoxetine  20 mg Oral BID  . gabapentin  600 mg Oral QID  . heparin  5,000 Units Subcutaneous 3 times per day  . losartan  100 mg Oral Daily   And  . hydrochlorothiazide  12.5 mg Oral Daily  . insulin aspart  0-15 Units Subcutaneous TID WC  . insulin aspart  0-5 Units Subcutaneous QHS  . levothyroxine  25 mcg Oral Daily  . pravastatin  40 mg Oral Daily  . rOPINIRole  0.5 mg Oral QHS   Continuous Infusions:   Principal Problem:   Nonhealing skin ulcer Active Problems:   Hypertension   Diabetes mellitus type 2 in obese   Peripheral neuropathy   Restless leg syndrome   Obstructive sleep apnea   PAD (peripheral artery disease)    Time spent: 35 minutes.     Niel Hummer A  [facility] Pager [phone]. If 7PM-7AM, please contact night-coverage at [email or web address], password [facility] 10/29/2014, 10:32 AM  LOS: 1 day

## 2014-10-29 NOTE — Progress Notes (Signed)
Patient refused CPAP.  Patient aware to have RN call RT if he changes his mind.

## 2014-10-30 DIAGNOSIS — I739 Peripheral vascular disease, unspecified: Secondary | ICD-10-CM

## 2014-10-30 LAB — CBC
HCT: 41.5 % (ref 39.0–52.0)
Hemoglobin: 14.2 g/dL (ref 13.0–17.0)
MCH: 32.3 pg (ref 26.0–34.0)
MCHC: 34.2 g/dL (ref 30.0–36.0)
MCV: 94.3 fL (ref 78.0–100.0)
Platelets: 307 10*3/uL (ref 150–400)
RBC: 4.4 MIL/uL (ref 4.22–5.81)
RDW: 12.5 % (ref 11.5–15.5)
WBC: 5.1 10*3/uL (ref 4.0–10.5)

## 2014-10-30 LAB — HEMOGLOBIN A1C
Hgb A1c MFr Bld: 7.3 % — ABNORMAL HIGH (ref 4.8–5.6)
Mean Plasma Glucose: 163 mg/dL

## 2014-10-30 LAB — BASIC METABOLIC PANEL
Anion gap: 9 (ref 5–15)
BUN: 19 mg/dL (ref 6–23)
CO2: 25 mmol/L (ref 19–32)
Calcium: 8.9 mg/dL (ref 8.4–10.5)
Chloride: 103 mmol/L (ref 96–112)
Creatinine, Ser: 0.9 mg/dL (ref 0.50–1.35)
GFR calc Af Amer: >90 mL/min (ref >90)
GFR calc non Af Amer: 84 mL/min — ABNORMAL LOW (ref >90)
Glucose, Bld: 185 mg/dL — ABNORMAL HIGH (ref 70–99)
Potassium: 3.9 mmol/L (ref 3.5–5.1)
Sodium: 137 mmol/L (ref 135–145)

## 2014-10-30 LAB — GLUCOSE, CAPILLARY
Glucose-Capillary: 157 mg/dL — ABNORMAL HIGH (ref 70–99)
Glucose-Capillary: 156 mg/dL — ABNORMAL HIGH (ref 70–99)
Glucose-Capillary: 185 mg/dL — ABNORMAL HIGH (ref 70–99)
Glucose-Capillary: 193 mg/dL — ABNORMAL HIGH (ref 70–99)
Glucose-Capillary: 189 mg/dL — ABNORMAL HIGH (ref 70–99)
Glucose-Capillary: 153 mg/dL — ABNORMAL HIGH (ref 70–99)

## 2014-10-30 MED ORDER — SODIUM CHLORIDE 0.9 % IV SOLN
INTRAVENOUS | Status: DC
  Administered 2014-10-31: 05:00:00 via INTRAVENOUS

## 2014-10-30 MED ORDER — SODIUM CHLORIDE 0.9 % IJ SOLN
3.0000 mL | Freq: Two times a day (BID) | INTRAMUSCULAR | Status: DC
  Administered 2014-10-30: 3 mL via INTRAVENOUS

## 2014-10-30 MED ORDER — SODIUM CHLORIDE 0.9 % IJ SOLN: 3.0000 mL | INTRAMUSCULAR | Status: DC | PRN

## 2014-10-30 MED ORDER — SODIUM CHLORIDE 0.9 % IV SOLN: 250.0000 mL | INTRAVENOUS | Status: DC | PRN

## 2014-10-30 MED ORDER — SACCHAROMYCES BOULARDII 250 MG PO CAPS
250.0000 mg | ORAL_CAPSULE | Freq: Two times a day (BID) | ORAL | Status: DC
  Administered 2014-10-30 – 2014-11-01 (×5): 250 mg via ORAL
  Filled 2014-10-30 (×8): qty 1

## 2014-10-30 MED ORDER — ASPIRIN 81 MG PO CHEW
81.0000 mg | CHEWABLE_TABLET | ORAL | Status: AC
  Administered 2014-10-31: 81 mg via ORAL
  Filled 2014-10-30: qty 1

## 2014-10-30 NOTE — Progress Notes (Signed)
Patient refused to wear CPAP. RT will continue to monitor 

## 2014-10-30 NOTE — Progress Notes (Signed)
[facility] PROGRESS NOTE  MELVILLE ENGEN ERD:[id number] DOB: 1946-01-26 DOA: 10/28/2014 PCP: [name], MD  Assessment/Plan: 1-Right foot Diabetes wound,PVD; markedly improved on clindamycin Continue with IV antibiotics, clindamycin day 3 Plan to transition to oral abx at discharge Start florastor Patient educated about c. Diff diarrhea Appreciate Wound care recs  Dr Gwenlyn Found Cardiologist consulted. Patient follow with Dr Gwenlyn Found for peripheral Vascular Diseases.  ABI pending Patient S/P stent posterior tibial right artery 02-2014  2-Diabetes with complications. SSI. Hold oral medications.  -  A1c  3-HTN;Cozaar and HCTZ. Norvasc.   4-OSA; CPAP  5-DVT prophylaxis; heparin (possible procedure pending ABI)  6-Hypothyroidism; continue with Synthroid.   7-PVD, S/P stent posterior tibial right artery 02-2014 ; continue with aspirin and Plavix.   Code Status: Full Code.  Family Communication: care discussed with patient.  Disposition Plan: remain inpatient.  Awaiting ABI, and Dr Gwenlyn Found evaluation.  Anticipate transition to oral clindamycin at discharge  Consultants:  Dr Gwenlyn Found cardiology  Procedures:  ABI; pending.   Antibiotics:  Clindamycin 2/8 >>  HPI/Subjective: Patient states his feet feel better.  They are less red and swollen and painful  Objective: Filed Vitals:   10/29/14 2122  BP: 168/81  Pulse: 75  Temp: 98 F (36.7 C)  Resp: 18    Intake/Output Summary (Last 24 hours) at 10/30/14 1007 Last data filed at 10/29/14 2321  Gross per 24 hour  Intake    346 ml  Output   1800 ml  Net  -1454 ml   Filed Weights   10/28/14 2206  Weight: 108.1 kg (238 lb 5.1 oz)    Exam:   General:  Alert in no distress, obese.   Cardiovascular: S 1, S 2 RRR, no rubs or murmur.   Respiratory: CTAB  Abdomen: BS present, obese, NT  Musculoskeletal: left great toe with open wound, minimal drainage.  Toe appears pinkish but not red. Right foot with plantar  open wound 4x4 cm.  Erythema has resolved.  Drained Blistered area with dead skin surround open wound.    Data Reviewed: Basic Metabolic Panel:  Recent Labs Lab 10/28/14 2130 10/29/14 0855 10/30/14 0807  NA 137 139 137  K 3.5 3.8 3.9  CL 100 107 103  CO2 29 24 25   GLUCOSE 177* 151* 185*  BUN 11 10 19   CREATININE 0.83 0.72 0.90  CALCIUM 8.7 9.0 8.9   Liver Function Tests:  Recent Labs Lab 10/28/14 2130 10/29/14 0855  AST 18 17  ALT 15 15  ALKPHOS 90 85  BILITOT 0.4 0.8  PROT 7.3 7.2  ALBUMIN 3.6 3.6   No results for input(s): LIPASE, AMYLASE in the last 168 hours. No results for input(s): AMMONIA in the last 168 hours. CBC:  Recent Labs Lab 10/28/14 1800 10/29/14 0855 10/30/14 0807  WBC 6.3 6.5 5.1  NEUTROABS 4.9 5.1  --   HGB 12.9* 13.6 14.2  HCT 37.1* 40.0 41.5  MCV 93.0 94.6 94.3  PLT 272 301 307   Cardiac Enzymes: No results for input(s): CKTOTAL, CKMB, CKMBINDEX, TROPONINI in the last 168 hours. BNP (last 3 results) No results for input(s): BNP in the last 8760 hours.  ProBNP (last 3 results) No results for input(s): PROBNP in the last 8760 hours.  CBG:  Recent Labs Lab 10/29/14 1206 10/29/14 1713 10/29/14 2221 10/30/14 0803  GLUCAP 216* 132* 194* 185*    No results found for this or any previous visit (from the past 240 hour(s)).   Studies: Dg Foot  2 Views Right  10/28/2014   CLINICAL DATA:  Right-sided cellulitis. Wound on the bottom of the right foot on the medial aspect for 6 months.  EXAM: RIGHT FOOT - 2 VIEW  COMPARISON:  06/03/2008  FINDINGS: Postoperative changes with previous arthrodesis and screw fixation at the right first metatarsal phalangeal joint. Lucencies and sclerosis in the mid shafts of the proximal phalanges at the second and third toes, probably old postoperative change from pin tract. Degenerative changes throughout the interphalangeal joints and intertarsal joints. Degenerative changes in the ankle. Small plantar and  Achilles calcaneal spurs. The lateral views demonstrates soft tissue swelling and soft tissue defect over the metatarsal head region consistent with numb chronic wound. No definite evidence of any underlying bone destruction or erosion. No plain film findings to suggest osteomyelitis. No acute fracture or dislocation.  IMPRESSION: Postoperative and degenerative changes as discussed. Soft tissue ulceration along the plantar aspect of the metatarsal head region. No definite evidence of osteomyelitis.   Electronically Signed   By: Lucienne Capers M.D.   On: 10/28/2014 18:23    Scheduled Meds: . amLODipine  10 mg Oral Daily  . aspirin EC  81 mg Oral Daily  . clindamycin (CLEOCIN) IV  600 mg Intravenous 3 times per day  . clopidogrel  75 mg Oral Daily  . DULoxetine  20 mg Oral BID  . feeding supplement (PRO-STAT SUGAR FREE 64)  30 mL Oral BID  . gabapentin  600 mg Oral QID  . heparin  5,000 Units Subcutaneous 3 times per day  . losartan  100 mg Oral Daily   And  . hydrochlorothiazide  12.5 mg Oral Daily  . insulin aspart  0-15 Units Subcutaneous TID WC  . insulin aspart  0-5 Units Subcutaneous QHS  . levothyroxine  25 mcg Oral Daily  . mupirocin cream   Topical Daily  . pravastatin  40 mg Oral Daily  . rOPINIRole  0.5 mg Oral QHS   Continuous Infusions:   Principal Problem:   Nonhealing skin ulcer Active Problems:   Hypertension   Diabetes mellitus type 2 in obese   Peripheral neuropathy   Restless leg syndrome   Obstructive sleep apnea   PAD (peripheral artery disease)    Time spent: 35 minutes.     Janece Canterbury  [facility] Pager [phone]. If 7PM-7AM, please contact night-coverage at [email or web address], password [facility] 10/30/2014, 10:07 AM  LOS: 2 days

## 2014-10-30 NOTE — Progress Notes (Signed)
VASCULAR LAB PRELIMINARY  ARTERIAL  ABI completed:    RIGHT    LEFT    PRESSURE WAVEFORM  PRESSURE WAVEFORM  BRACHIAL 158 Triphasic BRACHIAL 155 Triphasic  DP 74 Triphasic DP 130   AT   AT    PT 135 Triphasic PT 97 Triphasic  PER   PER    GREAT TOE  NA GREAT TOE  NA    RIGHT LEFT  ABI 0.85 0.82      Bilateral ABIs are suggestive of mild arterial insufficiency, however this may be inaccurate. The right brachial artery pressure was initially 164mmHg. After evaluating bilateral ankles, the patient was in pain due to his restless leg syndrome. The left brachial artery pressure was then taken and was 113mmHg, which prompted the need to the retake the right brachial artery pressure which was then 187mmHg. Being that the ankles were evaluated before the patient was in pain, the ABIs using the initial right brachial artery pressure would be 1.01 on the right and 0.97 on the left.   10/30/2014 2:33 PM Maudry Mayhew, RVT, RDCS, RDMS

## 2014-10-30 NOTE — Consult Note (Signed)
CARDIOLOGY CONSULT NOTE  Patient ID: [name] MRN: [id number] DOB/AGE: 1946-01-28 69 y.o.  Admit date: 10/28/2014  Primary Cardiologist :Dr. 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 He now works at [employer]. He was seen last year by Dr. Gwenlyn [name] for nonhealing small ulcers on the dorsal surface of his great toes bilaterally. His cardiovascular risk factor profile is remarkable for treated hypertension, diabetes and hyperlipidemia. He has had a stroke 03/08/12 and has carotid disease followed by Dr. Trula [name] ,..  He had angiography on  03/19/14 by Dr. Gwenlyn [name] demonstrating severe tibial disease and ultimately performed diamondback orbital rotational atherectomy of his right posterior tibial establishing excellent in-line flow. This resulted in ultimate healing of his right great toe ischemic ulcer, improvement in his symptoms and Dopplers as well. In November, he developed an ischemic ulcer on the plantar surface of the medial aspect of his right foot as a result of trauma. his most recent Dopplers performed 07/02/14 revealed a decrease in his right eye twice a day 0.75 with occlusion of his posterior tibial artery. The ulceration has been worsening and he also has a small ulcer in the plantar aspect of the left first toe.  Review of systems complete and [name] to be negative unless listed above   Past Medical History  Diagnosis Date  . Hypertension   . Diabetes mellitus   . Carotid artery occlusion   . Stroke March 08, 2012  . Hyperlipidemia   . Peripheral arterial disease     nonhealing ulcers bilaterally on each great toe  . PONV (postoperative nausea and vomiting)   . Arthritis   . Hypothyroidism   . Shortness of breath   . Obstructive sleep apnea     Family History  Problem Relation  Age of Onset  . Heart disease Mother   . Hypertension Mother   . Heart attack Mother   . Diabetes Son   . Heart disease Son   . Hypertension Son   . Hypertension Father   . Diabetes Father     History   Social History  . Marital Status: Married    Spouse Name: N/A  . Number of Children: N/A  . Years of Education: N/A   Occupational History  . Not on file.   Social History Main Topics  . Smoking status: Former Smoker -- 1 years    Types: Pipe    Quit date: 07/08/1977  . Smokeless tobacco: Never Used  . Alcohol Use: No  . Drug Use: No  . Sexual Activity: Not on file   Other Topics Concern  . Not on file   Social History Narrative    Past Surgical History  Procedure Laterality Date  . Back surgery    . Cervical fusion    . Foot surgery    . Spine surgery    . Eye surgery    . Endarterectomy Left 11/29/2013    Procedure: ENDARTERECTOMY CAROTID;  Surgeon: Serafina Mitchell, MD;  [facility];  Service: Vascular;  Laterality: Left;  . Carotid endarterectomy Left   . Pv angiogram  02/18/2014    tibial vessel diseas bil.  . Angioplasty  02/28/14    diamond back orbital rotational atherectomy of Rt. tibial  . Lower ext duplex doppler  03/14/14    Rt ABI 1.2  .  Lower extremity angiogram Bilateral 02/18/2014    Procedure: LOWER EXTREMITY ANGIOGRAM;  Surgeon: [name], MD;  Location: [facility] CATH LAB;  Service: Cardiovascular;  Laterality: Bilateral;     Prescriptions prior to admission  Medication Sig Dispense Refill Last Dose  . amLODipine (NORVASC) 10 MG tablet Take 10 mg by mouth daily.    Taking  . b complex vitamins tablet Take 1 tablet by mouth daily.   10/28/2014 at Unknown time  . clopidogrel (PLAVIX) 75 MG tablet Take 75 mg by mouth daily.    Taking  . DULoxetine (CYMBALTA) 20 MG capsule Take 1 capsule by mouth 2 (two) times daily.   Taking  . gabapentin (NEURONTIN) 600 MG tablet Take 600 mg by mouth 4 (four) times daily.    Taking  . levothyroxine (SYNTHROID,  LEVOTHROID) 25 MCG tablet Take 25 mcg by mouth daily.    Taking  . Misc Natural Products (OSTEO BI-FLEX JOINT SHIELD PO) Take 1 tablet by mouth 2 (two) times daily.   10/28/2014 at Unknown time  . pravastatin (PRAVACHOL) 40 MG tablet Take 40 mg by mouth every morning.    Taking  . rOPINIRole (REQUIP) 0.5 MG tablet Take 1 tablet (0.5 mg total) by mouth at bedtime. 30 tablet 11 Taking  . oxyCODONE (ROXICODONE) 5 MG immediate release tablet Take 1 tablet (5 mg total) by mouth every 6 (six) hours as needed for severe pain. (Patient not taking: Reported on 10/28/2014) 30 tablet 0 Not Taking at Unknown time    Physical Exam: Blood pressure 170/78, pulse 70, temperature 98.2 F (36.8 C), temperature source Oral, resp. rate 20, height 6' (1.829 m), weight 108.1 kg (238 lb 5.1 oz), SpO2 95 %.   Constitutional: He is oriented to person, place, and time. He appears well-developed and well-nourished. No distress.  HENT: No nasal discharge.  Head: Normocephalic and atraumatic.  Eyes: Pupils are equal and round.  No discharge. Neck: Normal range of motion. Neck supple. No JVD present. No thyromegaly present.  Cardiovascular: Normal rate, regular rhythm, normal heart sounds. Exam reveals no gallop and no friction rub. No murmur heard.  Pulmonary/Chest: Effort normal and breath sounds normal. No stridor. No respiratory distress. He has no wheezes. He has no rales. He exhibits no tenderness.  Abdominal: Soft. Bowel sounds are normal. He exhibits no distension. There is no tenderness. There is no rebound and no guarding.  Musculoskeletal: Normal range of motion. He exhibits no edema and no tenderness.  Neurological: He is alert and oriented to person, place, and time. Coordination normal.  Skin: Skin is warm and dry. No rash noted. He is not diaphoretic. No erythema. No pallor.  Psychiatric: He has a normal mood and affect. His behavior is normal. Judgment and thought content normal.  Vascular: Femoral pulses are  normal bilaterally. Distal pulses are not palpable. He has 2 cm ulceration on the plantar aspect of the first right metatarsal. There is a small about 5 mm ulceration on the left plantar aspect of the first toe     Labs:   Lab Results  Component Value Date   WBC 5.1 10/30/2014   HGB 14.2 10/30/2014   HCT 41.5 10/30/2014   MCV 94.3 10/30/2014   PLT 307 10/30/2014    Recent Labs Lab 10/29/14 0855 10/30/14 0807  NA 139 137  K 3.8 3.9  CL 107 103  CO2 24 25  BUN 10 19  CREATININE 0.72 0.90  CALCIUM 9.0 8.9  PROT 7.2  --  BILITOT 0.8  --   ALKPHOS 85  --   ALT 15  --   AST 17  --   GLUCOSE 151* 185*   No results [name] for: CKTOTAL, CKMB, CKMBINDEX, TROPONINI      ASSESSMENT AND PLAN:   Peripheral arterial disease with critical limb ischemia and nonhealing wound worse on the right side than the left side.  The patient is at risk of limb loss. I had a prolonged discussion with him about management options and recommend proceeding with abdominal aortogram with lower extremity runoff and possible endovascular intervention. Risks and benefits were discussed with the patient. He is already on aspirin and Plavix. The plan is to focus on the right side. The patient is scheduled for tomorrow with Dr. Gwenlyn [name]  Signed: Kathlyn Sacramento MD, [facility] 10/30/2014, 7:06 PM

## 2014-10-30 NOTE — Consult Note (Signed)
Came to visit patient at bedside to offer and explain [place] [employer] services on behalf of his [facility] Advantage insurance. Patient agreeable to [place] [employer] and consents were signed. Although patient reports he is knowledgeable about his medications and DM. He states he would still appreciate the follow up to be sure his managing well post hospital discharge. Explained to patient that he will receive post hospital discharge call and will be evaluated for monthly home visits. Confirmed contact information and left [facility] Care Management packet at bedside. Made inpatient RNCM aware. Marthenia Rolling, MSN- [facility] [phone]

## 2014-10-31 ENCOUNTER — Encounter (HOSPITAL_COMMUNITY)
Admission: EM | Disposition: A | Discharge status: Home / Self Care | Payer: Self-pay | Source: Home / Self Care | Attending: Internal Medicine

## 2014-10-31 ENCOUNTER — Encounter (HOSPITAL_COMMUNITY): Payer: Self-pay | Admitting: Cardiovascular Disease

## 2014-10-31 DIAGNOSIS — I70211 Atherosclerosis of native arteries of extremities with intermittent claudication, right leg: Secondary | ICD-10-CM

## 2014-10-31 DIAGNOSIS — L98493 Non-pressure chronic ulcer of skin of other sites with necrosis of muscle: Secondary | ICD-10-CM

## 2014-10-31 HISTORY — PX: LOWER EXTREMITY ANGIOGRAM: SHX5508

## 2014-10-31 LAB — BASIC METABOLIC PANEL
Anion gap: 9 (ref 5–15)
BUN: 23 mg/dL (ref 6–23)
CO2: 27 mmol/L (ref 19–32)
Calcium: 8.6 mg/dL (ref 8.4–10.5)
Chloride: 102 mmol/L (ref 96–112)
Creatinine, Ser: 0.78 mg/dL (ref 0.50–1.35)
GFR calc Af Amer: >90 mL/min (ref >90)
GFR calc non Af Amer: 89 mL/min — ABNORMAL LOW (ref >90)
Glucose, Bld: 164 mg/dL — ABNORMAL HIGH (ref 70–99)
Potassium: 3.6 mmol/L (ref 3.5–5.1)
Sodium: 138 mmol/L (ref 135–145)

## 2014-10-31 LAB — CBC
HCT: 40.4 % (ref 39.0–52.0)
Hemoglobin: 13.8 g/dL (ref 13.0–17.0)
MCH: 31.5 pg (ref 26.0–34.0)
MCHC: 34.2 g/dL (ref 30.0–36.0)
MCV: 92.2 fL (ref 78.0–100.0)
Platelets: 312 10*3/uL (ref 150–400)
RBC: 4.38 MIL/uL (ref 4.22–5.81)
RDW: 12.5 % (ref 11.5–15.5)
WBC: 4.9 10*3/uL (ref 4.0–10.5)

## 2014-10-31 LAB — GLUCOSE, CAPILLARY
Glucose-Capillary: 172 mg/dL — ABNORMAL HIGH (ref 70–99)
Glucose-Capillary: 172 mg/dL — ABNORMAL HIGH (ref 70–99)
Glucose-Capillary: 123 mg/dL — ABNORMAL HIGH (ref 70–99)

## 2014-10-31 LAB — POCT ACTIVATED CLOTTING TIME
Activated Clotting Time: 189 seconds
Activated Clotting Time: 214 seconds
Activated Clotting Time: 220 seconds
Activated Clotting Time: 153 seconds

## 2014-10-31 SURGERY — ANGIOGRAM, LOWER EXTREMITY
Laterality: Right

## 2014-10-31 MED ORDER — HEPARIN SODIUM (PORCINE) 1000 UNIT/ML IJ SOLN
INTRAMUSCULAR | Status: AC
  Filled 2014-10-31: qty 1

## 2014-10-31 MED ORDER — CLOPIDOGREL BISULFATE 75 MG PO TABS: 75.0000 mg | ORAL_TABLET | Freq: Every day | ORAL | Status: DC

## 2014-10-31 MED ORDER — ASPIRIN EC 325 MG PO TBEC
325.0000 mg | DELAYED_RELEASE_TABLET | Freq: Every day | ORAL | Status: DC
  Administered 2014-11-01: 11:00:00 325 mg via ORAL
  Filled 2014-10-31: qty 1

## 2014-10-31 MED ORDER — FENTANYL CITRATE 0.05 MG/ML IJ SOLN
INTRAMUSCULAR | Status: AC
  Filled 2014-10-31: qty 2

## 2014-10-31 MED ORDER — ZOLPIDEM TARTRATE 10 MG PO TABS
10.0000 mg | ORAL_TABLET | Freq: Every evening | ORAL | Status: DC | PRN
Start: 2014-10-31 — End: 2014-10-31

## 2014-10-31 MED ORDER — SODIUM CHLORIDE 0.9 % IV SOLN
INTRAVENOUS | Status: AC
  Administered 2014-10-31: 17:00:00 via INTRAVENOUS

## 2014-10-31 MED ORDER — ACETAMINOPHEN 325 MG PO TABS: 650.0000 mg | ORAL_TABLET | ORAL | Status: DC | PRN

## 2014-10-31 MED ORDER — LIDOCAINE HCL (PF) 1 % IJ SOLN
INTRAMUSCULAR | Status: AC
  Filled 2014-10-31: qty 30

## 2014-10-31 MED ORDER — ZOLPIDEM TARTRATE 5 MG PO TABS
5.0000 mg | ORAL_TABLET | Freq: Every evening | ORAL | Status: DC | PRN
  Administered 2014-10-31: 23:00:00 5 mg via ORAL
  Filled 2014-10-31: qty 1

## 2014-10-31 MED ORDER — HEPARIN (PORCINE) IN NACL 2-0.9 UNIT/ML-% IJ SOLN
INTRAMUSCULAR | Status: AC
  Filled 2014-10-31: qty 1000

## 2014-10-31 MED ORDER — ONDANSETRON HCL 4 MG/2ML IJ SOLN: 4.0000 mg | Freq: Four times a day (QID) | INTRAMUSCULAR | Status: DC | PRN

## 2014-10-31 MED ORDER — MIDAZOLAM HCL 2 MG/2ML IJ SOLN
INTRAMUSCULAR | Status: AC
  Filled 2014-10-31: qty 2

## 2014-10-31 MED ORDER — HYDRALAZINE HCL 20 MG/ML IJ SOLN
10.0000 mg | Freq: Four times a day (QID) | INTRAMUSCULAR | Status: DC | PRN
  Administered 2014-10-31 – 2014-11-01 (×2): 10 mg via INTRAVENOUS
  Filled 2014-10-31 (×2): qty 1

## 2014-10-31 NOTE — H&P (View-Only) (Deleted)
Came to visit patient at bedside to offer and explain [place] Management services on behalf of his [facility] Advantage insurance. Patient agreeable to [place] Management and consents were signed. Although patient reports he is knowledgeable about his medications and DM. He states he would still appreciate the follow up to be sure his managing well post hospital discharge. Explained to patient that he will receive post hospital discharge call and will be evaluated for monthly home visits. Confirmed contact information and left [facility] Care Management packet at bedside. Made inpatient RNCM aware. Marthenia Rolling, MSN- [facility] [phone]

## 2014-10-31 NOTE — Progress Notes (Signed)
[facility] PROGRESS NOTE  [name] PQZ:[id number] DOB: [birth year] DOA: 10/28/2014 PCP: [name], MD  Assessment/Plan:  1-Right foot Diabetes wound,PVD; markedly improved on clindamycin Continue with IV antibiotics, clindamycin day 4 Plan to transition to oral abx at discharge Continue florastor Patient educated about c. Diff diarrhea Appreciate Wound care recs  Patient underwent angiography with revascularization of a partially occluded posterior tibial stent on 2/11  2-Diabetes with complications.  CBG well controlled.  SSI. Hold oral medications.  -  A1c 7.3  3-HTN;Cozaar and HCTZ. Norvasc.   4-OSA; CPAP  5-DVT prophylaxis; heparin (possible procedure pending ABI)  6-Hypothyroidism; continue with Synthroid.   7-PVD, S/P stent posterior tibial right artery 02-2014 ; continue with aspirin and Plavix.   Code Status: Full Code.  Family Communication: care discussed with patient.  Disposition Plan:   Anticipate transition to oral clindamycin at discharge.  Will need follow up with wound care.    Consultants:  Dr Gwenlyn Found cardiology  Procedures:  ABI; pending.   Antibiotics:  Clindamycin 2/8 >>  HPI/Subjective: Patient states his feet feel better.  They are less red and swollen and painful.  Procedure went well today.    Objective: Filed Vitals:   10/31/14 1047  BP: 103/65  Pulse:   Temp:   Resp:     Intake/Output Summary (Last 24 hours) at 10/31/14 1051 Last data filed at 10/31/14 0900  Gross per 24 hour  Intake 281.25 ml  Output    575 ml  Net -293.75 ml   [facility] Weights   10/28/14 2206 10/30/14 1946  Weight: 108.1 kg (238 lb 5.1 oz) 105.461 kg (232 lb 8 oz)    Exam:   General:  Alert in no distress, obese.   Cardiovascular: S 1, S 2 RRR, no rubs or murmur.   Respiratory: CTAB  Abdomen: BS present, obese, NT  Musculoskeletal:  left great toe with open wound, minimal drainage.  Toe appears mildly pinkish but not red. Right  foot with plantar open wound 4x4 cm.  Erythema has mostly resolved.  Drained blistered area with dead skin surround open wound.    Data Reviewed: Basic Metabolic Panel:  Recent Labs Lab 10/28/14 2130 10/29/14 0855 10/30/14 0807 10/31/14 0727  NA 137 139 137 138  K 3.5 3.8 3.9 3.6  CL 100 107 103 102  CO2 29 24 25 27   GLUCOSE 177* 151* 185* 164*  BUN 11 10 19 23   CREATININE 0.83 0.72 0.90 0.78  CALCIUM 8.7 9.0 8.9 8.6   Liver Function Tests:  Recent Labs Lab 10/28/14 2130 10/29/14 0855  AST 18 17  ALT 15 15  ALKPHOS 90 85  BILITOT 0.4 0.8  PROT 7.3 7.2  ALBUMIN 3.6 3.6   No results for input(s): LIPASE, AMYLASE in the last 168 hours. No results for input(s): AMMONIA in the last 168 hours. CBC:  Recent Labs Lab 10/28/14 1800 10/29/14 0855 10/30/14 0807 10/31/14 0727  WBC 6.3 6.5 5.1 4.9  NEUTROABS 4.9 5.1  --   --   HGB 12.9* 13.6 14.2 13.8  HCT 37.1* 40.0 41.5 40.4  MCV 93.0 94.6 94.3 92.2  PLT 272 301 307 312   Cardiac Enzymes: No results for input(s): CKTOTAL, CKMB, CKMBINDEX, TROPONINI in the last 168 hours. BNP (last 3 results) No results for input(s): BNP in the last 8760 hours.  ProBNP (last 3 results) No results for input(s): PROBNP in the last 8760 hours.  CBG:  Recent Labs Lab 10/30/14 0803 10/30/14 1204 10/30/14  1705 10/30/14 2118 10/31/14 0810  GLUCAP 185* 193* 189* 153* 172*    No results found for this or any previous visit (from the past 240 hour(s)).   Studies: No results found.  Scheduled Meds: . amLODipine  10 mg Oral Daily  . aspirin EC  81 mg Oral Daily  . clindamycin (CLEOCIN) IV  600 mg Intravenous 3 times per day  . clopidogrel  75 mg Oral Daily  . DULoxetine  20 mg Oral BID  . feeding supplement (PRO-STAT SUGAR FREE 64)  30 mL Oral BID  . gabapentin  600 mg Oral QID  . heparin  5,000 Units Subcutaneous 3 times per day  . losartan  100 mg Oral Daily   And  . hydrochlorothiazide  12.5 mg Oral Daily  . insulin  aspart  0-15 Units Subcutaneous TID WC  . insulin aspart  0-5 Units Subcutaneous QHS  . levothyroxine  25 mcg Oral Daily  . mupirocin cream   Topical Daily  . pravastatin  40 mg Oral Daily  . rOPINIRole  0.5 mg Oral QHS  . saccharomyces boulardii  250 mg Oral BID  . sodium chloride  3 mL Intravenous Q12H   Continuous Infusions: . sodium chloride 75 mL/hr at 10/31/14 0503    Principal Problem:   Nonhealing skin ulcer Active Problems:   Hypertension   Diabetes mellitus type 2 in obese   Peripheral neuropathy   Restless leg syndrome   Obstructive sleep apnea   PAD (peripheral artery disease)    Time spent: 35 minutes.     Janece Canterbury  [facility] Pager [phone]. If 7PM-7AM, please contact night-coverage at [email or web address], password [facility] 10/31/2014, 10:51 AM  LOS: 3 days

## 2014-10-31 NOTE — Evaluation (Signed)
Physical Therapy Evaluation Patient Details Name: [name] MRN: [id number] DOB: 1946/02/18 Today's Date: 10/31/2014   History of Present Illness  [name] is a 69 y.o. male adm with  non healing foot ulcer;  Past medical history of hypertension, diabetes mellitus, peripheral vascular disease, suggesting sleep apnea, hypothyroidism, obesity.  Clinical Impression  Pt will benefit from PT to address deficits below; no f/u recommended at this time; will continue to follow for acute stay; discussed using cane at least initially for incr stability with pt and wife    Follow Up Recommendations No PT follow up    Equipment Recommendations  None recommended by PT    Recommendations for Other Services       Precautions / Restrictions Precautions Precautions: Fall Restrictions Weight Bearing Restrictions: Yes Other Position/Activity Restrictions: wears ortho pedic shoes at home      Mobility  Bed Mobility               General bed mobility comments: NT --pt on EOB  Transfers Overall transfer level: Modified independent                  Ambulation/Gait Ambulation/Gait assistance: Min guard Ambulation Distance (Feet): 180 Feet Assistive device: None (and IV push) Gait Pattern/deviations: Narrow base of support;Scissoring;Decreased dorsiflexion - left;Decreased dorsiflexion - right;Decreased stride length     General Gait Details: pt/wife report scissoring and excessive  inversion are close to pt baseline; min/guard for balance throughout and cues for obstacle negotiation at times  Stairs            Wheelchair Mobility    Modified Rankin (Stroke Patients Only)       Balance Overall balance assessment: Needs assistance Sitting-balance support: Feet supported;No upper extremity supported Sitting balance-Leahy Scale: Normal     Standing balance support: No upper extremity supported;Single extremity supported Standing balance-Leahy Scale:  Good   Single Leg Stance - Right Leg: 8 Single Leg Stance - Left Leg: 9         High level balance activites: Turns;Head turns;Direction changes High Level Balance Comments: pt with delayed reactions, steppage response to min  to mod perturbations             Pertinent Vitals/Pain Pain Assessment: No/denies pain    Home Living Family/patient expects to be discharged to:: Private residence Living Arrangements: Spouse/significant other Available Help at Discharge: Family Type of Home: House Home Access: Stairs to enter   Technical brewer of Steps: 1 Home Layout: Bed/bath upstairs;Multi-level Home Equipment: Environmental consultant - 2 wheels;Cane - single point;Grab bars - tub/shower      Prior Function Level of Independence: Independent         Comments: [name]-still works     Journalist, newspaper        Extremity/Trunk Assessment   Upper Extremity Assessment: Defer to OT evaluation           Lower Extremity Assessment: Overall WFL for tasks assessed         Communication   Communication: HOH  Cognition Arousal/Alertness: Awake/alert Behavior During Therapy: WFL for tasks assessed/performed Overall Cognitive Status: Within Functional Limits for tasks assessed                      General Comments      Exercises        Assessment/Plan    PT Assessment Patient needs continued PT services  PT Diagnosis Difficulty walking   PT Problem List Decreased  balance;Decreased activity tolerance;Decreased mobility  PT Treatment Interventions DME instruction;Gait training;Stair training;Functional mobility training;Therapeutic activities;Therapeutic exercise;Balance training   PT Goals (Current goals can be found in the Care Plan section) Acute Rehab PT Goals Patient Stated Goal: home soon PT Goal Formulation: With patient Time For Goal Achievement: 11/07/14 Potential to Achieve Goals: Good    Frequency Min 3X/week   Barriers to discharge         Co-evaluation               End of Session Equipment Utilized During Treatment: Gait belt Activity Tolerance: Patient tolerated treatment well Patient left: Other (comment);with call bell/phone within reach;with family/visitor present (EOB, RN aware)           Time: 1937-9024 PT Time Calculation (min) (ACUTE ONLY): 18 min   Charges:   PT Evaluation $Initial PT Evaluation Tier I: 1 Procedure     PT G CodesKenyon Ana 11-13-14, 11:27 AM

## 2014-10-31 NOTE — Interval H&P Note (Signed)
History and Physical Interval Note:  10/31/2014 4:56 PM  [name]  has presented today for surgery, with the diagnosis of claudication  The various methods of treatment have been discussed with the patient and family. After consideration of risks, benefits and other options for treatment, the patient has consented to  Procedure(s): LOWER EXTREMITY ANGIOGRAM (N/A) PTA PERIPHERAL ARTERY (Right) as a surgical intervention .  The patient's history has been reviewed, patient examined, no change in status, stable for surgery.  I have reviewed the patient's chart and labs.  Questions were answered to the patient's satisfaction.     [name]

## 2014-10-31 NOTE — CV Procedure (Signed)
[name] is a 69 y.o. male    [id number] LOCATION:  FACILITY: [facility]  PHYSICIAN: [name], M.D. 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 He now works at [employer]. He was seen last year by [name] for nonhealing small ulcers on the dorsal surface of his great toes bilaterally. His cardiovascular risk factor profile is remarkable for treated hypertension, diabetes and hyperlipidemia. He has had a stroke 03/08/12 and has carotid disease followed by [name] ,..  He had angiography on 03/19/14 by [name] demonstrating severe tibial disease and ultimately performed diamondback orbital rotational atherectomy of his right posterior tibial establishing excellent in-line flow. This resulted in ultimate healing of his right great toe ischemic ulcer, improvement in his symptoms and Dopplers as well. In November, he developed an ischemic ulcer on the plantar surface of the medial aspect of his right foot as a result of trauma. his most recent Dopplers performed 07/02/14 revealed a decrease in his right eye twice a day 0.75 with occlusion of his posterior tibial artery. The ulceration has been worsening and he also has a small ulcer in the plantar aspect of the left first toe. He presents now for angiography and potential intervention on his right lower extremity for critical limb ischemia   PROCEDURE DESCRIPTION:   The patient was brought to the second floor [facility] lab in the postabsorptive state. He was premedicated with Valium 5 mg by mouth, IV Versed and fentanyl. His left groinwas prepped and shaved in usual sterile fashion. Xylocaine 1% was used for local anesthesia. A 6 French sheath was inserted into the  left common femoral artery using standard Seldinger technique. Contralateral access was obtained with a crossover catheter and versicore wire. An endhole catheter was then placed in the right common femoral artery and right lower extremity angiography with runoff was performed using bolus chase digital subtraction step table technique. Visipaque dye was used for the entirety of the case. Retrograde aortic pressure was monitored during the case.   HEMODYNAMICS:    AO SYSTOLIC/AO DIASTOLIC: 034/74   Angiographic Data:   1: Right lower extremity-right anterior tibial artery was occluded. The right posterior tibial artery was subtotally occluded and diffusely diseased throughout its entirety down to the level of the foot  2: Left lower extremity-2 vessel runoff in occluded posterior tibial artery  IMPRESSION:[name] has restenosis of previous previously instrumented right posterior tibial artery with critical limb ischemia. We will proceed with PTCA to establish in-line flow to the foot and help facilitate healing.  Procedure Description:the patient received a total of 11,000 units of heparin intravenously and ACT of greater than 200. A total of 150 mL of contrast was administered to the patient. A 6 French 55 cm long multipurpose Ansell sheath was then advanced over the bifurcation. I used an 014/300 cm long recall he wire through a 018 150 cm long CXI end hole catheter. I was able to cross the entire posterior tibial artery in this fashion and then used a 2.5 x 120 mm long saber 014 below the knee balloon to dilate. Tylenol of the posterior tibial artery. The final angiographic result was reduction of a 95-99% diffusely diseased posterior tibial artery to less than 20% residual with  excellent flow down to the foot. The patient tolerated the procedure well. The sheath was then withdrawn across the bifurcation and exchanged over an 035 wire for a short 6 [place] sheath.  Final Impression: successful  PTCA of diffusely diseased subtotally occluded right posterior tibial artery in the setting of critical limb ischemia for limb salvage. The patient is on dual antiplatelet therapy. She will be removed and pressure held once the ACT falls below 170. He'll be hydrated overnight and wound care will continue to follow him. Once he is discharged home we will get lower extremity arterial Doppler studies in our [place] office after which I will see him back.    [name] MD, [facility] 10/31/2014 12:28 PM

## 2014-10-31 NOTE — H&P (View-Only) (Signed)
CARDIOLOGY CONSULT NOTE  Patient ID: [name] MRN: [id number] DOB/AGE: [birth year] 71 y.o.  Admit date: 10/28/2014  Primary Cardiologist :Dr. 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 He now works at [employer]. He was seen last year by Dr. Gwenlyn Found for nonhealing small ulcers on the dorsal surface of his great toes bilaterally. His cardiovascular risk factor profile is remarkable for treated hypertension, diabetes and hyperlipidemia. He has had a stroke 03/08/12 and has carotid disease followed by Dr. [name] ,..  He had angiography on  03/19/14 by Dr. Gwenlyn Found demonstrating severe tibial disease and ultimately performed diamondback orbital rotational atherectomy of his right posterior tibial establishing excellent in-line flow. This resulted in ultimate healing of his right great toe ischemic ulcer, improvement in his symptoms and Dopplers as well. In November, he developed an ischemic ulcer on the plantar surface of the medial aspect of his right foot as a result of trauma. his most recent Dopplers performed 07/02/14 revealed a decrease in his right eye twice a day 0.75 with occlusion of his posterior tibial artery. The ulceration has been worsening and he also has a small ulcer in the plantar aspect of the left first toe.  Review of systems complete and found to be negative unless listed above   Past Medical History  Diagnosis Date  . Hypertension   . Diabetes mellitus   . Carotid artery occlusion   . Stroke March 08, 2012  . Hyperlipidemia   . Peripheral arterial disease     nonhealing ulcers bilaterally on each great toe  . PONV (postoperative nausea and vomiting)   . Arthritis   . Hypothyroidism   . Shortness of breath   . Obstructive sleep apnea     Family History  Problem Relation  Age of Onset  . Heart disease Mother   . Hypertension Mother   . Heart attack Mother   . Diabetes Son   . Heart disease Son   . Hypertension Son   . Hypertension Father   . Diabetes Father     History   Social History  . Marital Status: Married    Spouse Name: N/A  . Number of Children: N/A  . Years of Education: N/A   Occupational History  . Not on file.   Social History Main Topics  . Smoking status: Former Smoker -- 1 years    Types: Pipe    Quit date: 07/08/1977  . Smokeless tobacco: Never Used  . Alcohol Use: No  . Drug Use: No  . Sexual Activity: Not on file   Other Topics Concern  . Not on file   Social History Narrative    Past Surgical History  Procedure Laterality Date  . Back surgery    . Cervical fusion    . Foot surgery    . Spine surgery    . Eye surgery    . Endarterectomy Left 11/29/2013    Procedure: ENDARTERECTOMY CAROTID;  Surgeon: Serafina Mitchell, MD;  Location: [facility] OR;  Service: Vascular;  Laterality: Left;  . Carotid endarterectomy Left   . Pv angiogram  02/18/2014    tibial vessel diseas bil.  . Angioplasty  02/28/14    diamond back orbital rotational atherectomy of Rt. tibial  . Lower ext duplex doppler  03/14/14    Rt ABI 1.2  .  Lower extremity angiogram Bilateral 02/18/2014    Procedure: LOWER EXTREMITY ANGIOGRAM;  Surgeon: [name], MD;  Location: [facility] CATH LAB;  Service: Cardiovascular;  Laterality: Bilateral;     Prescriptions prior to admission  Medication Sig Dispense Refill Last Dose  . amLODipine (NORVASC) 10 MG tablet Take 10 mg by mouth daily.    Taking  . b complex vitamins tablet Take 1 tablet by mouth daily.   10/28/2014 at Unknown time  . clopidogrel (PLAVIX) 75 MG tablet Take 75 mg by mouth daily.    Taking  . DULoxetine (CYMBALTA) 20 MG capsule Take 1 capsule by mouth 2 (two) times daily.   Taking  . gabapentin (NEURONTIN) 600 MG tablet Take 600 mg by mouth 4 (four) times daily.    Taking  . levothyroxine (SYNTHROID,  LEVOTHROID) 25 MCG tablet Take 25 mcg by mouth daily.    Taking  . Misc Natural Products (OSTEO BI-FLEX JOINT SHIELD PO) Take 1 tablet by mouth 2 (two) times daily.   10/28/2014 at Unknown time  . pravastatin (PRAVACHOL) 40 MG tablet Take 40 mg by mouth every morning.    Taking  . rOPINIRole (REQUIP) 0.5 MG tablet Take 1 tablet (0.5 mg total) by mouth at bedtime. 30 tablet 11 Taking  . oxyCODONE (ROXICODONE) 5 MG immediate release tablet Take 1 tablet (5 mg total) by mouth every 6 (six) hours as needed for severe pain. (Patient not taking: Reported on 10/28/2014) 30 tablet 0 Not Taking at Unknown time    Physical Exam: Blood pressure 170/78, pulse 70, temperature 98.2 F (36.8 C), temperature source Oral, resp. rate 20, height 6' (1.829 m), weight 108.1 kg (238 lb 5.1 oz), SpO2 95 %.   Constitutional: He is oriented to person, place, and time. He appears well-developed and well-nourished. No distress.  HENT: No nasal discharge.  Head: Normocephalic and atraumatic.  Eyes: Pupils are equal and round.  No discharge. Neck: Normal range of motion. Neck supple. No JVD present. No thyromegaly present.  Cardiovascular: Normal rate, regular rhythm, normal heart sounds. Exam reveals no gallop and no friction rub. No murmur heard.  Pulmonary/Chest: Effort normal and breath sounds normal. No stridor. No respiratory distress. He has no wheezes. He has no rales. He exhibits no tenderness.  Abdominal: Soft. Bowel sounds are normal. He exhibits no distension. There is no tenderness. There is no rebound and no guarding.  Musculoskeletal: Normal range of motion. He exhibits no edema and no tenderness.  Neurological: He is alert and oriented to person, place, and time. Coordination normal.  Skin: Skin is warm and dry. No rash noted. He is not diaphoretic. No erythema. No pallor.  Psychiatric: He has a normal mood and affect. His behavior is normal. Judgment and thought content normal.  Vascular: Femoral pulses are  normal bilaterally. Distal pulses are not palpable. He has 2 cm ulceration on the plantar aspect of the first right metatarsal. There is a small about 5 mm ulceration on the left plantar aspect of the first toe     Labs:   Lab Results  Component Value Date   WBC 5.1 10/30/2014   HGB 14.2 10/30/2014   HCT 41.5 10/30/2014   MCV 94.3 10/30/2014   PLT 307 10/30/2014    Recent Labs Lab 10/29/14 0855 10/30/14 0807  NA 139 137  K 3.8 3.9  CL 107 103  CO2 24 25  BUN 10 19  CREATININE 0.72 0.90  CALCIUM 9.0 8.9  PROT 7.2  --  BILITOT 0.8  --   ALKPHOS 85  --   ALT 15  --   AST 17  --   GLUCOSE 151* 185*   No results found for: CKTOTAL, CKMB, CKMBINDEX, TROPONINI      ASSESSMENT AND PLAN:   Peripheral arterial disease with critical limb ischemia and nonhealing wound worse on the right side than the left side.  The patient is at risk of limb loss. I had a prolonged discussion with him about management options and recommend proceeding with abdominal aortogram with lower extremity runoff and possible endovascular intervention. Risks and benefits were discussed with the patient. He is already on aspirin and Plavix. The plan is to focus on the right side. The patient is scheduled for tomorrow with Dr. Gwenlyn Found  Signed: Kathlyn Sacramento MD, [facility] 10/30/2014, 7:06 PM

## 2014-10-31 NOTE — Interval H&P Note (Deleted)
History and Physical Interval Note:  10/31/2014 11:16 AM  [name]  has presented today for surgery, with the diagnosis of claudication  The various methods of treatment have been discussed with the patient and family. After consideration of risks, benefits and other options for treatment, the patient has consented to  Procedure(s): LOWER EXTREMITY ANGIOGRAM (N/A) as a surgical intervention .  The patient's history has been reviewed, patient examined, no change in status, stable for surgery.  I have reviewed the patient's chart and labs.  Questions were answered to the patient's satisfaction.     [name]

## 2014-11-01 ENCOUNTER — Other Ambulatory Visit: Payer: Self-pay | Admitting: Physician Assistant

## 2014-11-01 ENCOUNTER — Encounter (HOSPITAL_COMMUNITY): Payer: Self-pay | Admitting: *Deleted

## 2014-11-01 DIAGNOSIS — I739 Peripheral vascular disease, unspecified: Secondary | ICD-10-CM

## 2014-11-01 DIAGNOSIS — I998 Other disorder of circulatory system: Secondary | ICD-10-CM

## 2014-11-01 LAB — CBC
HCT: 41.2 % (ref 39.0–52.0)
Hemoglobin: 14.1 g/dL (ref 13.0–17.0)
MCH: 32 pg (ref 26.0–34.0)
MCHC: 34.2 g/dL (ref 30.0–36.0)
MCV: 93.6 fL (ref 78.0–100.0)
Platelets: 292 10*3/uL (ref 150–400)
RBC: 4.4 MIL/uL (ref 4.22–5.81)
RDW: 12.4 % (ref 11.5–15.5)
WBC: 6.9 10*3/uL (ref 4.0–10.5)

## 2014-11-01 LAB — GLUCOSE, CAPILLARY
Glucose-Capillary: 151 mg/dL — ABNORMAL HIGH (ref 70–99)
Glucose-Capillary: 114 mg/dL — ABNORMAL HIGH (ref 70–99)

## 2014-11-01 LAB — HEMOGLOBIN A1C
Hgb A1c MFr Bld: 7.1 % — ABNORMAL HIGH (ref 4.8–5.6)
Mean Plasma Glucose: 157 mg/dL

## 2014-11-01 LAB — BASIC METABOLIC PANEL
Anion gap: 8 (ref 5–15)
BUN: 18 mg/dL (ref 6–23)
CO2: 22 mmol/L (ref 19–32)
Calcium: 9 mg/dL (ref 8.4–10.5)
Chloride: 104 mmol/L (ref 96–112)
Creatinine, Ser: 0.75 mg/dL (ref 0.50–1.35)
GFR calc Af Amer: >90 mL/min (ref >90)
GFR calc non Af Amer: >90 mL/min (ref >90)
Glucose, Bld: 137 mg/dL — ABNORMAL HIGH (ref 70–99)
Potassium: 3.9 mmol/L (ref 3.5–5.1)
Sodium: 134 mmol/L — ABNORMAL LOW (ref 135–145)

## 2014-11-01 MED ORDER — MUPIROCIN CALCIUM 2 % EX CREA: TOPICAL_CREAM | Freq: Every day | CUTANEOUS | Status: DC

## 2014-11-01 MED ORDER — CLINDAMYCIN HCL 300 MG PO CAPS: 300.0000 mg | ORAL_CAPSULE | Freq: Four times a day (QID) | ORAL | Status: DC

## 2014-11-01 MED ORDER — SACCHAROMYCES BOULARDII 250 MG PO CAPS: 250.0000 mg | ORAL_CAPSULE | Freq: Two times a day (BID) | ORAL | Status: DC

## 2014-11-01 MED ORDER — ASPIRIN 325 MG PO TBEC: 325.0000 mg | DELAYED_RELEASE_TABLET | Freq: Every day | ORAL | Status: DC

## 2014-11-01 NOTE — Discharge Instructions (Signed)
Diabetes and Foot Care Diabetes may cause you to have problems because of poor blood supply (circulation) to your feet and legs. This may cause the skin on your feet to become thinner, break easier, and heal more slowly. Your skin may become dry, and the skin may peel and crack. You may also have nerve damage in your legs and feet causing decreased feeling in them. You may not notice minor injuries to your feet that could lead to infections or more serious problems. Taking care of your feet is one of the most important things you can do for yourself.  HOME CARE INSTRUCTIONS  Wear shoes at all times, even in the house. Do not go barefoot. Bare feet are easily injured.  Check your feet daily for blisters, cuts, and redness. If you cannot see the bottom of your feet, use a mirror or ask someone for help.  Wash your feet with warm water (do not use hot water) and mild soap. Then pat your feet and the areas between your toes until they are completely dry. Do not soak your feet as this can dry your skin.  Apply a moisturizing lotion or petroleum jelly (that does not contain alcohol and is unscented) to the skin on your feet and to dry, brittle toenails. Do not apply lotion between your toes.  Trim your toenails straight across. Do not dig under them or around the cuticle. File the edges of your nails with an emery board or nail file.  Do not cut corns or calluses or try to remove them with medicine.  Wear clean socks or stockings every day. Make sure they are not too tight. Do not wear knee-high stockings since they may decrease blood flow to your legs.  Wear shoes that fit properly and have enough cushioning. To break in new shoes, wear them for just a few hours a day. This prevents you from injuring your feet. Always look in your shoes before you put them on to be sure there are no objects inside.  Do not cross your legs. This may decrease the blood flow to your feet.  If you find a minor scrape,  cut, or break in the skin on your feet, keep it and the skin around it clean and dry. These areas may be cleansed with mild soap and water. Do not cleanse the area with peroxide, alcohol, or iodine.  When you remove an adhesive bandage, be sure not to damage the skin around it.  If you have a wound, look at it several times a day to make sure it is healing.  Do not use heating pads or hot water bottles. They may burn your skin. If you have lost feeling in your feet or legs, you may not know it is happening until it is too late.  Make sure your health care provider performs a complete foot exam at least annually or more often if you have foot problems. Report any cuts, sores, or bruises to your health care provider immediately. SEEK MEDICAL CARE IF:   You have an injury that is not healing.  You have cuts or breaks in the skin.  You have an ingrown nail.  You notice redness on your legs or feet.  You feel burning or tingling in your legs or feet.  You have pain or cramps in your legs and feet.  Your legs or feet are numb.  Your feet always feel cold. SEEK IMMEDIATE MEDICAL CARE IF:   There is increasing redness,   swelling, or pain in or around a wound.  There is a red line that goes up your leg.  Pus is coming from a wound.  You develop a fever or as directed by your health care provider.  You notice a bad smell coming from an ulcer or wound. Document Released: 09/03/2000 Document Revised: 05/09/2013 Document Reviewed: 02/13/2013 Exit[employer] Patient Information 2015 [employer]. This information is not intended to replace advice given to you by your health care provider. Make sure you discuss any questions you have with your health care provider.  

## 2014-11-01 NOTE — Progress Notes (Signed)
TELEMETRY: Reviewed telemetry pt in NSR with rare PVC: Filed Vitals:   10/31/14 2318 11/01/14 0350 11/01/14 0400 11/01/14 0423  BP:  167/63 163/79 128/46  Pulse: 75 79 81   Temp: 97.7 F (36.5 C) 97.7 F (36.5 C)    TempSrc: Oral Oral    Resp: 20 20    Height:      Weight:      SpO2: 95% 93% 93%     Intake/Output Summary (Last 24 hours) at 11/01/14 0720 Last data filed at 11/01/14 0359  Gross per 24 hour  Intake 1603.75 ml  Output   1700 ml  Net -96.25 ml   [facility] Weights   10/28/14 2206 10/30/14 1946  Weight: 238 lb 5.1 oz (108.1 kg) 232 lb 8 oz (105.461 kg)    Subjective Feels very well. No complications.  [name] amLODipine  10 mg Oral Daily  . aspirin EC  325 mg Oral Daily  . clindamycin (CLEOCIN) IV  600 mg Intravenous 3 times per day  . clopidogrel  75 mg Oral Daily  . DULoxetine  20 mg Oral BID  . feeding supplement (PRO-STAT SUGAR FREE 64)  30 mL Oral BID  . gabapentin  600 mg Oral QID  . losartan  100 mg Oral Daily   And  . hydrochlorothiazide  12.5 mg Oral Daily  . insulin aspart  0-15 Units Subcutaneous TID WC  . insulin aspart  0-5 Units Subcutaneous QHS  . levothyroxine  25 mcg Oral Daily  . mupirocin cream   Topical Daily  . pravastatin  40 mg Oral Daily  . rOPINIRole  0.5 mg Oral QHS  . saccharomyces boulardii  250 mg Oral BID      LABS: Basic Metabolic Panel:  Recent Labs  10/31/14 0727 11/01/14 0452  NA 138 134*  K 3.6 3.9  CL 102 104  CO2 27 22  GLUCOSE 164* 137*  BUN 23 18  CREATININE 0.78 0.75  CALCIUM 8.6 9.0   Liver Function Tests:  Recent Labs  10/29/14 0855  AST 17  ALT 15  ALKPHOS 85  BILITOT 0.8  PROT 7.2  ALBUMIN 3.6   No results for input(s): LIPASE, AMYLASE in the last 72 hours. CBC:  Recent Labs  10/29/14 0855  10/31/14 0727 11/01/14 0452  WBC 6.5  < > 4.9 6.9  NEUTROABS 5.1  --   --   --   HGB 13.6  < > 13.8 14.1  HCT 40.0  < > 40.4 41.2  MCV 94.6  < > 92.2 93.6  PLT 301  < > 312 292  < > = values  in this interval not displayed. Cardiac Enzymes: No results for input(s): CKTOTAL, CKMB, CKMBINDEX, TROPONINI in the last 72 hours. BNP: No results for input(s): PROBNP in the last 72 hours. D-Dimer: No results for input(s): DDIMER in the last 72 hours. Hemoglobin A1C:  Recent Labs  10/31/14 0727  HGBA1C 7.1*   Fasting Lipid Panel: No results for input(s): CHOL, HDL, LDLCALC, TRIG, CHOLHDL, LDLDIRECT in the last 72 hours. Thyroid Function Tests: No results for input(s): TSH, T4TOTAL, T3FREE, THYROIDAB in the last 72 hours.  Invalid input(s): FREET3   Radiology/Studies:  No results found.  PHYSICAL EXAM General: Well developed, well nourished, in no acute distress. Head: Normocephalic, atraumatic, sclera non-icteric, oropharynx is clear Neck: Negative for carotid bruits. JVD not elevated. No adenopathy Lungs: Clear bilaterally to auscultation without wheezes, rales, or rhonchi. Breathing is unlabored. Heart: RRR S1 S2 without  murmurs, rubs, or gallops.  Abdomen: Soft, non-tender, non-distended with normoactive bowel sounds. No hepatomegaly. No rebound/guarding. No obvious abdominal masses. Msk:  Strength and tone appears normal for age. Extremities: No  edema.  Distal pedal pulses are not palpable. Feet are warm. Ulcers on toes are dressed. Femoral pulses are 2+ without cath site hematoma. Neuro: Alert and oriented X 3. Moves all extremities spontaneously. Psych:  Responds to questions appropriately with a normal affect.  ASSESSMENT AND PLAN: 1. PAD with critical limb ischemia. S/p PTCA of right posterior tibial artery. Doing well. Continue DAPT with ASA and Plavix. Patient is stable for DC today from our standpoint. Follow up Dopplers in [facility] office with subsequent follow up with Dr. Gwenlyn Found.  Present on Admission:  . Nonhealing skin ulcer . Hypertension . Obstructive sleep apnea . Restless leg syndrome . PAD (peripheral artery disease)  Signed, Jaquelyn Sakamoto [place],  [facility] 11/01/2014 7:20 AM

## 2014-11-01 NOTE — Discharge Summary (Signed)
Physician Discharge Summary  [name] OAC:[id number] DOB: 28-Jul-1946 DOA: 10/28/2014  PCP: [name], MD  Admit date: 10/28/2014 Discharge date: 11/01/2014  Recommendations for Outpatient Follow-up:  1. Continue ASA and plavix 2. Continue clindamycin to complete a 10-day course of antibiotics 3. Follow up for dopplers at [facility] office followed by appointment with Dr. Gwenlyn Found 4. Continue mupirocin BID to foot ulcers and recover with foam dressing.  Change dressing every five days or prn.  Discharge Diagnoses:  Principal Problem:   Nonhealing skin ulcer Active Problems:   Hypertension   Diabetes mellitus type 2 in obese   Peripheral neuropathy   Restless leg syndrome   Obstructive sleep apnea   PAD (peripheral artery disease)   Discharge Condition: stable, improved  Diet recommendation: diabetic  Wt Readings from Last 3 Encounters:  11/01/14 113 kg (249 lb 1.9 oz)  10/28/14 107.956 kg (238 lb)  07/31/14 111.812 kg (246 lb 8 oz)    History of present illness:  69 year old male with past medical history of hypertension, diabetes mellitus, peripheral vascular disease including carotid stents of the bilateral lower extremities, sleep apnea, hypothyroidism, obesity. Patient presented with a nonhealing ulcer of the left first toe, and plantar surface of the right foot. Given followed by podiatry but had increasing odor, drainage, and erythema over the 2 days prior to admission.  He was admitted for IV antibiotics and assessment of his peripheral vascular disease.  Hospital Course:   Right foot and left large toe diabetic and PVD foot ulcers.  He was started on clindamycin and had marked improvement in his erythema. He underwent ABI testing and had 0.8 right/0.8 left ratios. He was seen by Dr. Gwenlyn Found who was concerned about occlusion of his previous stents. Patient underwent angiography with revascularization of a partially occluded posterior tibial stent on the  right side on 2/11.   Continue clindamycin day 5 of 10, Rx provided Continue florastor Patient and wife educated about c. Diff diarrhea  Diabetes with complications. CBG well controlled. SSI.  A1c 7.3.  Resume oral medications  HTN; continue Cozaar and HCTZ. Norvasc.   OSA; CPAP  Hypothyroidism; continue with Synthroid.   PVD, S/P stent posterior tibial right artery 02-2014 ; continue with aspirin and Plavix.   Procedures:  ABI angiography with revascularization of a partially occluded posterior tibial stent on the right side on 2/11.  Consultations:  Dr. Gwenlyn Found, Cardiology  Discharge Exam: Filed Vitals:   11/01/14 0736  BP: 179/81  Pulse: 94  Temp: 97.3 F (36.3 C)  Resp: 18   Filed Vitals:   11/01/14 0400 11/01/14 0423 11/01/14 0700 11/01/14 0736  BP: 163/79 128/46  179/81  Pulse: 81   94  Temp:    97.3 F (36.3 C)  TempSrc:    Oral  Resp:    18  Height:      Weight:   113 kg (249 lb 1.9 oz)   SpO2: 93%       General: Alert in no distress, obese.   Cardiovascular: S 1, S 2 RRR, no rubs or murmur.   Respiratory: CTAB  Abdomen: BS present, obese, NT  Musculoskeletal: left great toe with open wound, dry base. Toe appears normal dorsally. Right foot with plantar open wound 4x4 cm. Erythema has almost completely resolved. Drained blistered area with dead skin surround open wound now dry base.  Dressings were off when I visited.    Discharge Instructions      Discharge Instructions    Call  MD for:  difficulty breathing, headache or visual disturbances    Complete by:  As directed      Call MD for:  extreme fatigue    Complete by:  As directed      Call MD for:  hives    Complete by:  As directed      Call MD for:  persistant dizziness or light-headedness    Complete by:  As directed      Call MD for:  persistant nausea and vomiting    Complete by:  As directed      Call MD for:  redness, tenderness, or signs of infection (pain, swelling,  redness, odor or green/yellow discharge around incision site)    Complete by:  As directed      Call MD for:  severe uncontrolled pain    Complete by:  As directed      Call MD for:  temperature >100.4    Complete by:  As directed      Diet Carb Modified    Complete by:  As directed      Discharge instructions    Complete by:  As directed   You were hospitalized with infected foot ulcers.  You were given antibiotics and had a blockage of the blood vessels of your legs opened up by Dr. Gwenlyn Found.  Please continue aspirin 325mg  daily along with your plavix to keep the stent open.  Please continue clindamycin for the next 5 days and use florastor (or yogurt) to try to reduce the risk of infectious diarrhea.  If you develop watery diarrhea, please seek immediate medical attention.  Dr. Kennon Holter office will call you to schedule follow up.  In the mean time, please use mupirocin cream twice a day on your ulcers and cover with the foam dressing.  The dressings need to be exchanged every 5 days or sooner if needed.     Increase activity slowly    Complete by:  As directed             Medication List    TAKE these medications        amLODipine 10 MG tablet  Commonly known as:  NORVASC  Take 10 mg by mouth daily.     aspirin 325 MG EC tablet  Take 1 tablet (325 mg total) by mouth daily.     b complex vitamins tablet  Take 1 tablet by mouth daily.     clindamycin 300 MG capsule  Commonly known as:  CLEOCIN  Take 1 capsule (300 mg total) by mouth 4 (four) times daily.     clopidogrel 75 MG tablet  Commonly known as:  PLAVIX  Take 75 mg by mouth daily.     DULoxetine 20 MG capsule  Commonly known as:  CYMBALTA  Take 1 capsule by mouth 2 (two) times daily.     gabapentin 600 MG tablet  Commonly known as:  NEURONTIN  Take 600 mg by mouth 4 (four) times daily.     levothyroxine 25 MCG tablet  Commonly known as:  SYNTHROID, LEVOTHROID  Take 25 mcg by mouth daily.     mupirocin cream 2 %   Commonly known as:  BACTROBAN  Apply topically daily.     OSTEO BI-FLEX JOINT SHIELD PO  Take 1 tablet by mouth 2 (two) times daily.     oxyCODONE 5 MG immediate release tablet  Commonly known as:  ROXICODONE  Take 1 tablet (5 mg total) by mouth every  6 (six) hours as needed for severe pain.     pravastatin 40 MG tablet  Commonly known as:  PRAVACHOL  Take 40 mg by mouth every morning.     rOPINIRole 0.5 MG tablet  Commonly known as:  REQUIP  Take 1 tablet (0.5 mg total) by mouth at bedtime.     saccharomyces boulardii 250 MG capsule  Commonly known as:  FLORASTOR  Take 1 capsule (250 mg total) by mouth 2 (two) times daily.       Follow-up Information    Follow up with [facility] On 11/08/2014.   Specialty:  Cardiology   Why:  @ 12:30pm for ultrasound of leg   Contact information:   [address] [place] [phone]      Follow up with [name], MD.   Specialty:  Cardiology   Why:  The office will call you to make an appoinment., If you do not hear from them, please contact them., You should be seen within 2-3 weeks.   Contact information:   [address] [place] [address] [phone]        The results of significant diagnostics from this hospitalization (including imaging, microbiology, ancillary and laboratory) are listed below for reference.    Significant Diagnostic Studies: Dg Foot 2 Views Right  10/28/2014   CLINICAL DATA:  Right-sided cellulitis. Wound on the bottom of the right foot on the medial aspect for 6 months.  EXAM: RIGHT FOOT - 2 VIEW  COMPARISON:  06/03/2008  FINDINGS: Postoperative changes with previous arthrodesis and screw fixation at the right first metatarsal phalangeal joint. Lucencies and sclerosis in the mid shafts of the proximal phalanges at the second and third toes, probably old postoperative change from pin tract. Degenerative changes throughout the  interphalangeal joints and intertarsal joints. Degenerative changes in the ankle. Small plantar and Achilles calcaneal spurs. The lateral views demonstrates soft tissue swelling and soft tissue defect over the metatarsal head region consistent with numb chronic wound. No definite evidence of any underlying bone destruction or erosion. No plain film findings to suggest osteomyelitis. No acute fracture or dislocation.  IMPRESSION: Postoperative and degenerative changes as discussed. Soft tissue ulceration along the plantar aspect of the metatarsal head region. No definite evidence of osteomyelitis.   Electronically Signed   By: Lucienne Capers M.D.   On: 10/28/2014 18:23    Microbiology: No results found for this or any previous visit (from the past 240 hour(s)).   Labs: Basic Metabolic Panel:  Recent Labs Lab 10/28/14 2130 10/29/14 0855 10/30/14 0807 10/31/14 0727 11/01/14 0452  NA 137 139 137 138 134*  K 3.5 3.8 3.9 3.6 3.9  CL 100 107 103 102 104  CO2 29 24 25 27 22   GLUCOSE 177* 151* 185* 164* 137*  BUN 11 10 19 23 18   CREATININE 0.83 0.72 0.90 0.78 0.75  CALCIUM 8.7 9.0 8.9 8.6 9.0   Liver Function Tests:  Recent Labs Lab 10/28/14 2130 10/29/14 0855  AST 18 17  ALT 15 15  ALKPHOS 90 85  BILITOT 0.4 0.8  PROT 7.3 7.2  ALBUMIN 3.6 3.6   No results for input(s): LIPASE, AMYLASE in the last 168 hours. No results for input(s): AMMONIA in the last 168 hours. CBC:  Recent Labs Lab 10/28/14 1800 10/29/14 0855 10/30/14 0807 10/31/14 0727 11/01/14 0452  WBC 6.3 6.5 5.1 4.9 6.9  NEUTROABS 4.9 5.1  --   --   --  HGB 12.9* 13.6 14.2 13.8 14.1  HCT 37.1* 40.0 41.5 40.4 41.2  MCV 93.0 94.6 94.3 92.2 93.6  PLT 272 301 307 312 292   Cardiac Enzymes: No results for input(s): CKTOTAL, CKMB, CKMBINDEX, TROPONINI in the last 168 hours. BNP: BNP (last 3 results) No results for input(s): BNP in the last 8760 hours.  ProBNP (last 3 results) No results for input(s): PROBNP  in the last 8760 hours.  CBG:  Recent Labs Lab 10/31/14 0810 10/31/14 1741 10/31/14 2131 11/01/14 0711 11/01/14 1251  GLUCAP 172* 172* 123* 151* 114*    Time coordinating discharge: 35 minutes  Signed:  Raelie Lohr  [facility] 11/01/2014, 1:07 PM

## 2014-11-05 ENCOUNTER — Telehealth: Payer: Self-pay | Admitting: Cardiovascular Disease

## 2014-11-05 NOTE — Telephone Encounter (Signed)
Pt called in stating that he was discharged from the hospital on 2/12 and was suppose to follow up at the womb center. He states that he called the center in [place] and was unable to get someone to answer and when he called in the office in [place] the next available date was in March. Please call the pt  Thanks

## 2014-11-05 NOTE — Telephone Encounter (Signed)
Please help facilitate [name] appointment at the wound care center. I just opened up his posterior tibial artery and he needs aggressive wound care fairly soon.

## 2014-11-05 NOTE — Telephone Encounter (Signed)
Returned call to patient Dr.Berry advised needs appointment with wound center soon.[facility] wound center closed at present.Message sent to Dr.Berry's nurse [place] for appointment.

## 2014-11-05 NOTE — Telephone Encounter (Signed)
Returned call to patient he stated Dr.Berry wanted him to be seen at wound center.Stated he called wound center but they would not schedule appointment.Stated they wanted office to call and schedule appointment.Message sent to [facility] for a order.

## 2014-11-05 NOTE — Telephone Encounter (Signed)
Sent to me in error. 

## 2014-11-07 ENCOUNTER — Telehealth: Payer: Self-pay | Admitting: Podiatry

## 2014-11-07 ENCOUNTER — Telehealth: Payer: Self-pay | Admitting: *Deleted

## 2014-11-07 DIAGNOSIS — E08621 Diabetes mellitus due to underlying condition with foot ulcer: Secondary | ICD-10-CM

## 2014-11-07 DIAGNOSIS — L97509 Non-pressure chronic ulcer of other part of unspecified foot with unspecified severity: Principal | ICD-10-CM

## 2014-11-07 NOTE — Telephone Encounter (Signed)
error 

## 2014-11-07 NOTE — Telephone Encounter (Signed)
I sent the order for Wound Care to [facility].

## 2014-11-07 NOTE — Telephone Encounter (Signed)
-----   Message from Jackquline Denmark sent at 11/07/2014 10:14 AM EST ----- Izora Gala from [facility]ound [facility] called asking if we were to be referring pt to wound care.Pt is scheduled for 2/25 and they need orders if we have. Her # is [phone] and fax # is [phone]

## 2014-11-08 ENCOUNTER — Ambulatory Visit (HOSPITAL_COMMUNITY)
Admission: RE | Admit: 2014-11-08 | Discharge: 2014-11-08 | Disposition: A | Discharge status: Home / Self Care | Payer: PPO | Source: Ambulatory Visit | Attending: Cardiology | Admitting: Cardiology

## 2014-11-08 DIAGNOSIS — I739 Peripheral vascular disease, unspecified: Secondary | ICD-10-CM | POA: Diagnosis not present

## 2014-11-08 NOTE — Progress Notes (Signed)
Right Lower Ext. Arterial Duplex Completed following status post intervention. Oda Cogan, BS, RDMS, RVT

## 2014-11-11 ENCOUNTER — Ambulatory Visit (INDEPENDENT_AMBULATORY_CARE_PROVIDER_SITE_OTHER): Payer: Medicare Other | Admitting: Podiatry

## 2014-11-11 VITALS — BP 142/85 | HR 79 | Temp 97.8°F | Resp 13

## 2014-11-11 DIAGNOSIS — L89891 Pressure ulcer of other site, stage 1: Secondary | ICD-10-CM

## 2014-11-11 DIAGNOSIS — L97529 Non-pressure chronic ulcer of other part of left foot with unspecified severity: Secondary | ICD-10-CM

## 2014-11-11 DIAGNOSIS — E08621 Diabetes mellitus due to underlying condition with foot ulcer: Secondary | ICD-10-CM | POA: Diagnosis not present

## 2014-11-11 DIAGNOSIS — L97519 Non-pressure chronic ulcer of other part of right foot with unspecified severity: Secondary | ICD-10-CM

## 2014-11-11 NOTE — Telephone Encounter (Signed)
Called patient to confirm appt with him. Spoke to wife and she stated that the patient has an appt at [facility] on 2/25. Asked pt to call back on Friday so I can make sure that appointment was what was needed and he will get the care needed. At that time if needed will cancel appointment with [facility].

## 2014-11-11 NOTE — Telephone Encounter (Signed)
Call returned from [name] in [place]. Pt said it would be easier to come to Wound Care center in [place] on a weekly basis instead of [place].

## 2014-11-11 NOTE — Patient Instructions (Signed)
There is a schedule visit for you at the [name] on 11/14/2014 at 9:30 AM.  Limit the amount of standing walking is much as possible Leave the attach bandages on right and left feet 3 days unless excessive drainage is noted

## 2014-11-11 NOTE — Telephone Encounter (Signed)
Spoke to [place] and got an appointment for patient 3/2 @ 0945. Lavella Lemons also requested information about the patient records. She requested H&P, Insurance and Demographics information.

## 2014-11-11 NOTE — Telephone Encounter (Signed)
Message left with [facility] in [place] on [place]. Expecting a return call.

## 2014-11-11 NOTE — Progress Notes (Signed)
Patient ID: [name], male   DOB: 22-Aug-1946, 69 y.o.   MRN: [id number]  Subjective: This patient presents today requesting debridement of scaling skin on his plantar right first MPJ diabetic foot ulcer. He has a pending follow-up treatment with the wound care center at [facility] on 11/14/2014 at 9:30 AM. On the initial visit to our office on 10/28/2014 patient was referred immediately to ER for infection control. He did present to ER and was hospitalized for infection as well as vascular disease. The infection has been resolved Langley Gauss had vascular stenting. His concern today is scaling around the plantar right first MPJ ulcer and is requesting the area debrided prior to his scheduled visit to the wound care  Objective: Orientated 3 The plantar right first MPJ after debridement has a 20 m plantar ulcer with a red granular base surrounded by hyperkeratotic and scaling tissue. There is no active erythema, edema, warmth surrounding this wound The plantar left hallux is a 10 mm plantar skin ulcer with a granular base surrounded by hyperkeratotic tissue without any erythema or drainage surrounding this wound site  Assessment: Noninfected diabetic skin ulcerations 2  Plan: Debrided both ulcer sites and dressed with Silvadene. Attached protective felt pad around plantar wound right Follow-up care will be provided at the wound care center on 11/14/2014.

## 2014-11-14 ENCOUNTER — Ambulatory Visit (HOSPITAL_COMMUNITY): Payer: PPO | Attending: Podiatry | Admitting: Physical Therapy

## 2014-11-14 DIAGNOSIS — E119 Type 2 diabetes mellitus without complications: Secondary | ICD-10-CM | POA: Diagnosis not present

## 2014-11-14 DIAGNOSIS — T148XXA Other injury of unspecified body region, initial encounter: Secondary | ICD-10-CM

## 2014-11-14 DIAGNOSIS — I10 Essential (primary) hypertension: Secondary | ICD-10-CM | POA: Diagnosis not present

## 2014-11-14 DIAGNOSIS — S91102D Unspecified open wound of left great toe without damage to nail, subsequent encounter: Secondary | ICD-10-CM | POA: Diagnosis not present

## 2014-11-14 DIAGNOSIS — M6289 Other specified disorders of muscle: Secondary | ICD-10-CM

## 2014-11-14 NOTE — Therapy (Signed)
[place], [place], [address] Phone: [phone]   Fax:  [phone]  Wound Care Evaluation  Patient Details  Name: [name] MRN: [id number] Date of Birth: 05/14/1946 Referring Provider:  Kendell Bane, DPM  Encounter Date: 11/14/2014      PT End of Session - 11/14/14 1543    Visit Number 1   Number of Visits 16   Date for PT Re-Evaluation 01/13/15   Authorization Type medicare   PT Start Time 0930   PT Stop Time 1055   PT Time Calculation (min) 85 min      Past Medical History  Diagnosis Date  . Hypertension   . Diabetes mellitus   . Carotid artery occlusion   . Stroke March 08, 2012  . Hyperlipidemia   . Peripheral arterial disease     nonhealing ulcers bilaterally on each great toe  . PONV (postoperative nausea and vomiting)   . Arthritis   . Hypothyroidism   . Shortness of breath   . Obstructive sleep apnea     Past Surgical History  Procedure Laterality Date  . Back surgery    . Cervical fusion    . Foot surgery    . Spine surgery    . Eye surgery    . Endarterectomy Left 11/29/2013    Procedure: ENDARTERECTOMY CAROTID;  Surgeon: Serafina Mitchell, MD;  Location: [facility] OR;  Service: Vascular;  Laterality: Left;  . Carotid endarterectomy Left   . Pv angiogram  02/18/2014    tibial vessel diseas bil.  . Angioplasty  02/28/14    diamond back orbital rotational atherectomy of Rt. tibial  . Lower ext duplex doppler  03/14/14    Rt ABI 1.2  . Lower extremity angiogram Bilateral 02/18/2014    Procedure: LOWER EXTREMITY ANGIOGRAM;  Surgeon: [name], MD;  Location[facility] C[facility] LAB;  Service: Cardiovascular;  Laterality: Bilateral;  . Lower extremity angiogram N/A 10/31/2014    Procedure: LOWER EXTREMITY ANGIOGRAM;  Surgeon: [name], MD;  Location: [facility] CATH LAB;  Service: Cardiovascular;  Laterality: N/A;    There were no vitals taken for this visit.  Visit Diagnosis:  Nonhealing  nonsurgical wound with necrosis of muscle         Wound Therapy - 11/14/14 1057    Subjective [name] states that he has had a sore on his Lt foot for over a year.  He is not sure what caused this sore.  His sore on his Right foot has been there for six months.  This wound started when he was working in his yard and tripped a vine causing him to come down on his right foot very hard.  This bruised the area and then a wound appeared.  He is now being referred for wound care.   Patient and Family Stated Goals wounds to heal    Date of Onset --  Lt 09/20/2013; Rt 03/04/2014   Prior Treatments podiatrists self care.   Pain Assessment No/denies pain   Wound Properties Date First Assessed: 11/14/14 Time First Assessed: 1000 Wound Type: Diabetic ulcer Location: Toe (Comment  which one) Location Orientation: Left , Great Toe  Wound Description (Comments): Plantar aspect of Lt great toe  , tunneling noted  Present on Admission: Yes   Dressing Type Gauze (Comment)  hydrogel medihoney collodal sheet   Dressing Changed Changed   Dressing Status Intact   Dressing Change Frequency --  twice a week  Site / Wound Assessment Clean;Yellow;Other (Comment)  callous around the wound    % Wound base Red or Granulating 60%   % Wound base Yellow 40%   Peri-wound Assessment Intact;Other (Comment)  calloused   Wound Length (cm) 1.2 cm   Wound Width (cm) 0.3 cm   Wound Depth (cm) 0.3 cm   Tunneling (cm) --   Undermining (cm) anterior .7; posterior 1.2; lateral .5; mecial .2    Drainage Amount Minimal   Drainage Description Serous   Treatment Cleansed;Debridement (Selective)   Wound Properties Date First Assessed: 03/14/14 Time First Assessed: 1930 Wound Type: Diabetic ulcer Location: Other (Comment) , r big toe  Location Orientation: Right Wound Description (Comments): nonhealing wound on planar aspct of MTP of great toe  Present on Admission: Yes   Dressing Type Gauze (Comment);Hydrocolloid  medihoney    Dressing Changed Changed   Dressing Status Clean;Old drainage   Dressing Change Frequency --  twice a week   Site / Wound Assessment Dusky   % Wound base Red or Granulating 60%   % Wound base Yellow 40%   Peri-wound Assessment --  calloused area 1.0 cm medially and posteriorly;.5 anterior/l   Wound Length (cm) 1.8 cm   Wound Width (cm) 1.6 cm   Wound Depth (cm) 0.3 cm   Closure --  mm test dorsiflexion Rt 3+/5 Lt 3/5   Drainage Amount Minimal   Drainage Description Serous   Treatment Cleansed;Debridement (Selective)   Selective Debridement - Location wound and callouse areas around wounds   Selective Debridement - Tools Used Forceps;Scissors   Selective Debridement - Tissue Removed slough and callous   Wound Therapy - Clinical Statement Pt is a 69 yo with non-healing wounds; over a year for the Lt wound. Pt is diabetic and has poor blood circulation in the rt LE but he did have an angioplasty on 11/11/2014.  He has been referred and will benefit from skilled PT to improve his healing environment including debridement of wound and periarea; off loading wound area; increasing protien intake and decreasing blood sugars.    Wound Therapy - Functional Problem List difficulty walking   Factors Delaying/Impairing Wound Healing Altered sensation;Diabetes Mellitus;Infection - systemic/local;Multiple medical problems;Vascular compromise   Hydrotherapy Plan Debridement;Dressing change;Patient/family education   Wound Therapy - Frequency --  2x wek x 8 weeks   Wound Therapy - Current Recommendations PT   Wound Plan Pt be encouraged to keep sugars under control, increase protien intake and off load wounds ( darco boot ordered); Wounds and callous to be debrided twice a week for 8 weeks or until healed.  Dressing with medihoney. 2x2 and kling with netting to secure bandage.   Pt to be given HEP with t-band for dorsiflexion, LAQ and hip flexion sitting.   Dressing  medihoney f/b 2x2 ; kling and  netting on Both LE   Decrease Necrotic Tissue to STG: 4 weeks: 0%   Decrease Necrotic Tissue - Progress Goal set today   Increase Granulation Tissue to STG: 4 weeks 100%   Increase Granulation Tissue - Progress Goal set today   Decrease Length/Width/Depth by (cm) STG: decrease Lt to have no tunnelling ; Rt to be decresed to 1.0x .75 x .1 STG for 4 weeks;  LTG healed 8 weeks   Decrease Length/Width/Depth - Progress Goal set today   Improve Drainage Characteristics --  STG: 2 weeks scant drainage; 4 weeks LTG no drainage   Improve Drainage Characteristics - Progress Goal set today   Patient/Family  will be able to  I in HEP to strengthen DF by 1  grade 8 weeks    Additional Wound Therapy Goal Pt to state it is easier to walk 8 weeks    Time For Goal Achievement --  8 weeks   Wound Therapy - Potential for Goals Good            G-Codes - 11-17-14 1545    Functional Limitation Other PT primary   Other PT Primary Current Status (Q3009) At least 80 percent but less than 100 percent impaired, limited or restricted   Other PT Primary Goal Status (Q3300) At least 1 percent but less than 20 percent impaired, limited or restricted      Problem List Patient Active Problem List   Diagnosis Date Noted  . Nonhealing skin ulcer 10/28/2014  . PAD (peripheral artery disease) 10/28/2014  . Bilateral carotid artery disease 07/08/2014  . Critical lower limb ischemia 02/28/2014  . Obstructive sleep apnea 02/12/2014  . Aftercare following surgery of the circulatory system, [place] 12/04/2013  . Carotid stenosis 11/29/2013  . Restless leg syndrome 10/23/2013  . Sleep apnea 10/23/2013  . Obesity (BMI 30.0-34.9) 10/23/2013  . Hyperlipidemia 07/16/2013  . Peripheral arterial disease 07/16/2013  . Occlusion and stenosis of carotid artery without mention of cerebral infarction 04/24/2012  . History of stroke June 2013 03/14/2012  . Hypertension 03/14/2012  . Diabetes mellitus type 2 in obese 03/14/2012   . Peripheral neuropathy 03/14/2012  . Cardiomyopathy- EF NL 5/14 03/14/2012  . ARTHRITIS, RIGHT FOOT 06/26/2008    Bradleigh Sonnen,CINDY PT Nov 17, 2014, 3:46 PM  [facility] [address] [place], [place], [address] Phone: [phone]   Fax:  [phone]

## 2014-11-18 ENCOUNTER — Ambulatory Visit (HOSPITAL_COMMUNITY): Payer: PPO | Admitting: Physical Therapy

## 2014-11-18 DIAGNOSIS — S91102D Unspecified open wound of left great toe without damage to nail, subsequent encounter: Secondary | ICD-10-CM | POA: Diagnosis not present

## 2014-11-18 DIAGNOSIS — T148XXA Other injury of unspecified body region, initial encounter: Secondary | ICD-10-CM

## 2014-11-18 DIAGNOSIS — M6289 Other specified disorders of muscle: Secondary | ICD-10-CM

## 2014-11-18 NOTE — Telephone Encounter (Signed)
Called wound care in [place] to cancel appt for patient on 3/2. Pt has multiple upcoming appointments with wound care in [place].  Notified pt.

## 2014-11-18 NOTE — Therapy (Signed)
[place], [place], [address] Phone: [phone]   Fax:  [phone]  Wound Care Therapy  Patient Details  Name: [name] MRN: [id number] Date of Birth: 05-06-1946 Referring Provider:  [name], MD  Encounter Date: 11/18/2014      PT End of Session - 11/18/14 1212    Visit Number 2   Number of Visits 16   Authorization Type medicare   PT Start Time 1015   PT Stop Time 1112   PT Time Calculation (min) 57 min      Past Medical History  Diagnosis Date  . Hypertension   . Diabetes mellitus   . Carotid artery occlusion   . Stroke March 08, 2012  . Hyperlipidemia   . Peripheral arterial disease     nonhealing ulcers bilaterally on each great toe  . PONV (postoperative nausea and vomiting)   . Arthritis   . Hypothyroidism   . Shortness of breath   . Obstructive sleep apnea     Past Surgical History  Procedure Laterality Date  . Back surgery    . Cervical fusion    . Foot surgery    . Spine surgery    . Eye surgery    . Endarterectomy Left 11/29/2013    Procedure: ENDARTERECTOMY CAROTID;  Surgeon: Serafina Mitchell, MD;  Location: [facility] OR;  Service: Vascular;  Laterality: Left;  . Carotid endarterectomy Left   . Pv angiogram  02/18/2014    tibial vessel diseas bil.  . Angioplasty  02/28/14    diamond back orbital rotational atherectomy of Rt. tibial  . Lower ext duplex doppler  03/14/14    Rt ABI 1.2  . Lower extremity angiogram Bilateral 02/18/2014    Procedure: LOWER EXTREMITY ANGIOGRAM;  Surgeon: [name], MD;  Location: [facility] CATH LAB;  Service: Cardiovascular;  Laterality: Bilateral;  . Lower extremity angiogram N/A 10/31/2014    Procedure: LOWER EXTREMITY ANGIOGRAM;  Surgeon: [name], MD;  Location: [facility] CATH LAB;  Service: Cardiovascular;  Laterality: N/A;    There were no vitals taken for this visit.  Visit Diagnosis:  Nonhealing nonsurgical wound with necrosis of  muscle          Wound Therapy - 11/18/14 1206    Subjective Pt states his feet felt better than they have in awhile.  Pt blood sugar was 100 today and pt increased protein to68 grams.    Patient and Family Stated Goals wounds to heal    Date of Onset --  Lt 09/20/2013; Rt 03/04/2014   Prior Treatments podiatrists self care.   Pain Assessment 0-10   Wound Properties Date First Assessed: 11/14/14 Time First Assessed: 1000 Wound Type: Diabetic ulcer Location: Toe (Comment  which one) Location Orientation: Left , Great Toe  Wound Description (Comments): Plantar aspect of Lt great toe  , tunneling noted  Present on Admission: Yes   Dressing Type Gauze (Comment)  hydrogel medihoney collodal sheet   Dressing Changed Changed   Dressing Status Intact   Dressing Change Frequency --  twice a week    Site / Wound Assessment Clean;Yellow;Other (Comment)  callous around the wound    % Wound base Red or Granulating 60%   % Wound base Yellow 40%   Peri-wound Assessment Intact;Other (Comment)  calloused   Wound Length (cm) 1.2 cm   Wound Width (cm) 0.3 cm   Wound Depth (cm) 0.3 cm   Undermining (cm) all  aspects   Drainage Amount Minimal   Drainage Description Serous   Treatment Cleansed;Debridement (Selective)   Wound Properties Date First Assessed: 03/14/14 Time First Assessed: 1930 Wound Type: Diabetic ulcer Location: Other (Comment) , r big toe  Location Orientation: Right Wound Description (Comments): nonhealing wound on planar aspct of MTP of great toe  Present on Admission: Yes   Dressing Type Gauze (Comment);Hydrocolloid  medihoney   Dressing Changed Changed   Dressing Status Clean;Old drainage   Dressing Change Frequency --  twice a week   Site / Wound Assessment Dusky   % Wound base Red or Granulating 60%   % Wound base Yellow 40%   Peri-wound Assessment --  calloused area 1.0 cm medially and posteriorly;.5 anterior/l   Wound Length (cm) 1.8 cm   Wound Width (cm) 1.6 cm   Wound  Depth (cm) 0.3 cm   Closure --  mm test dorsiflexion Rt 3+/5 Lt 3/5   Drainage Amount Minimal   Drainage Description Serous   Treatment Cleansed;Debridement (Selective)   Selective Debridement - Location wound and callouse areas around wounds   Selective Debridement - Tools Used Forceps;Scissors   Selective Debridement - Tissue Removed slough and callous   Wound Therapy - Clinical Statement Pt callous softened slightly.  Darco boot has not yet arrived anticipat that they will be in by Wed. appointment.    Wound Therapy - Functional Problem List difficulty walking   Factors Delaying/Impairing Wound Healing Altered sensation;Diabetes Mellitus;Infection - systemic/local;Multiple medical problems;Vascular compromise   Hydrotherapy Plan Debridement;Dressing change;Patient/family education   Wound Therapy - Frequency --  2x wek x 8 weeks   Wound Therapy - Current Recommendations PT   Wound Plan Pt to have gait training with darco boots once they arrive to ensure safety of gt.  Pt does have a cane at home.    Dressing  medihoney f/b 2x2 ; kling and netting on Both LE   Decrease Necrotic Tissue to STG: 4 weeks: 0%   Decrease Necrotic Tissue - Progress Progressing toward goal   Increase Granulation Tissue to STG: 4 weeks 100%   Increase Granulation Tissue - Progress Progressing toward goal   Decrease Length/Width/Depth by (cm) STG: decrease Lt to have no tunnelling ; Rt to be decresed to 1.0x .75 x .1 STG for 4 weeks;  LTG healed 8 weeks   Decrease Length/Width/Depth - Progress Progressing toward goal   Improve Drainage Characteristics --  STG: 2 weeks scant drainage; 4 weeks LTG no drainage   Improve Drainage Characteristics - Progress Progressing toward goal   Patient/Family will be able to  I in HEP to strengthen DF by 1  grade 8 weeks    Patient/Family Instruction Goal - Progress Met   Additional Wound Therapy Goal Pt to state it is easier to walk 8 weeks    Additional Wound Therapy Goal -  Progress Progressing toward goal   Time For Goal Achievement --  8 weeks   Wound Therapy - Potential for Goals Good         Problem List Patient Active Problem List   Diagnosis Date Noted  . Nonhealing skin ulcer 10/28/2014  . PAD (peripheral artery disease) 10/28/2014  . Bilateral carotid artery disease 07/08/2014  . Critical lower limb ischemia 02/28/2014  . Obstructive sleep apnea 02/12/2014  . Aftercare following surgery of the circulatory system, [place] 12/04/2013  . Carotid stenosis 11/29/2013  . Restless leg syndrome 10/23/2013  . Sleep apnea 10/23/2013  . Obesity (BMI 30.0-34.9) 10/23/2013  .  Hyperlipidemia 07/16/2013  . Peripheral arterial disease 07/16/2013  . Occlusion and stenosis of carotid artery without mention of cerebral infarction 04/24/2012  . History of stroke June 2013 03/14/2012  . Hypertension 03/14/2012  . Diabetes mellitus type 2 in obese 03/14/2012  . Peripheral neuropathy 03/14/2012  . Cardiomyopathy- EF NL 5/14 03/14/2012  . ARTHRITIS, RIGHT FOOT 06/26/2008    Polette Nofsinger,CINDY PT 11/18/2014, 12:13 PM  [facility] [address] [place], [place], [address] Phone: [phone]   Fax:  [phone]

## 2014-11-21 ENCOUNTER — Ambulatory Visit (HOSPITAL_COMMUNITY): Payer: PPO | Attending: Podiatry

## 2014-11-21 DIAGNOSIS — T148XXA Other injury of unspecified body region, initial encounter: Secondary | ICD-10-CM

## 2014-11-21 DIAGNOSIS — M6289 Other specified disorders of muscle: Secondary | ICD-10-CM

## 2014-11-21 DIAGNOSIS — E119 Type 2 diabetes mellitus without complications: Secondary | ICD-10-CM | POA: Diagnosis not present

## 2014-11-21 DIAGNOSIS — S91102D Unspecified open wound of left great toe without damage to nail, subsequent encounter: Secondary | ICD-10-CM | POA: Diagnosis not present

## 2014-11-21 DIAGNOSIS — I10 Essential (primary) hypertension: Secondary | ICD-10-CM | POA: Insufficient documentation

## 2014-11-21 NOTE — Therapy (Signed)
[place], [place], [address] Phone: [phone]   Fax:  [phone]  Wound Care Therapy  Patient Details  Name: [name] MRN: [id number] Date of Birth: [birth year] Referring Provider:  [name], MD  Encounter Date: 11/21/2014      PT End of Session - 11/21/14 1514    Visit Number 3   Number of Visits 16   Date for PT Re-Evaluation 01/13/15   Authorization Type medicare   PT Start Time 1115   PT Stop Time 1200   PT Time Calculation (min) 45 min   Equipment Utilized During Treatment Gait belt   Activity Tolerance Patient tolerated treatment well   Behavior During Therapy [facility] for tasks assessed/performed      Past Medical History  Diagnosis Date  . Hypertension   . Diabetes mellitus   . Carotid artery occlusion   . Stroke March 08, 2012  . Hyperlipidemia   . Peripheral arterial disease     nonhealing ulcers bilaterally on each great toe  . PONV (postoperative nausea and vomiting)   . Arthritis   . Hypothyroidism   . Shortness of breath   . Obstructive sleep apnea     Past Surgical History  Procedure Laterality Date  . Back surgery    . Cervical fusion    . Foot surgery    . Spine surgery    . Eye surgery    . Endarterectomy Left 11/29/2013    Procedure: ENDARTERECTOMY CAROTID;  Surgeon: Serafina Mitchell, MD;  Location: [facility] OR;  Service: Vascular;  Laterality: Left;  . Carotid endarterectomy Left   . Pv angiogram  02/18/2014    tibial vessel diseas bil.  . Angioplasty  02/28/14    diamond back orbital rotational atherectomy of Rt. tibial  . Lower ext duplex doppler  03/14/14    Rt ABI 1.2  . Lower extremity angiogram Bilateral 02/18/2014    Procedure: LOWER EXTREMITY ANGIOGRAM;  Surgeon: [name], MD;  Location: [facility] CATH LAB;  Service: Cardiovascular;  Laterality: Bilateral;  . Lower extremity angiogram N/A 10/31/2014    Procedure: LOWER EXTREMITY ANGIOGRAM;  Surgeon: [name], MD;  Location: [facility] CATH LAB;  Service: Cardiovascular;  Laterality: N/A;    There were no vitals taken for this visit.  Visit Diagnosis:  Nonhealing nonsurgical wound with necrosis of muscle      Subjective Assessment - 11/21/14 1253    Symptoms Pain free, dressings intact   Currently in Pain? No/denies                   Wound Therapy - 11/21/14 1253    Subjective Pain free, dressings intact   Patient and Family Stated Goals wounds to heal    Date of Onset --  LT 09/20/2013, Rt 03/04/2014   Prior Treatments podiatrists self care.   Pain Assessment 0-10   Pain Score 0-No pain   Wound Properties Date First Assessed: 11/14/14 Time First Assessed: 1000 Wound Type: Diabetic ulcer Location: Toe (Comment  which one) Location Orientation: Left , Great Toe  Wound Description (Comments): Plantar aspect of Lt great toe  , tunneling noted  Present on Admission: Yes   Dressing Type Gauze (Comment)  Hydrogel, medihoney colloidal sheet   Dressing Changed Changed   Dressing Status Intact   Site / Wound Assessment Clean;Yellow;Other (Comment)  callouses around wounds   % Wound base Red or Granulating 65%   % Wound  base Yellow 35%   Peri-wound Assessment Intact;Other (Comment)   Wound Length (cm) 1.1 cm   Wound Width (cm) 0.3 cm   Wound Depth (cm) 0.3 cm   Undermining (cm) all aspects   Drainage Amount Minimal   Drainage Description Serous   Treatment Cleansed;Debridement (Selective)   Wound Properties Date First Assessed: 03/14/14 Time First Assessed: 1930 Wound Type: Diabetic ulcer Location: Other (Comment) , r big toe  Location Orientation: Right Wound Description (Comments): nonhealing wound on planar aspct of MTP of great toe  Present on Admission: Yes   Dressing Type Gauze (Comment);Hydrocolloid  medihoney hydrocolloid   Dressing Changed Changed   Dressing Status Clean;Old drainage   Site / Wound Assessment Dusky   % Wound base Red or Granulating 60%   % Wound base  Yellow 40%   Peri-wound Assessment --  calloused surrounding wound   Wound Length (cm) 1.6 cm   Wound Width (cm) 1.5 cm   Wound Depth (cm) 0.3 cm   Drainage Amount Minimal   Drainage Description Serous   Treatment Cleansed;Debridement (Selective)   Selective Debridement - Location wound and callouse areas around wounds   Selective Debridement - Tools Used Forceps;Scissors;Scalpel   Selective Debridement - Tissue Removed slough and callous   Wound Therapy - Clinical Statement Received one darco boot, gait training complete with boot on Rt LE with SPC.  Callouses are softening with medihoney.  Selective debridement for removal of callouses and slough directly over and surrounding wound.  No reports of increased pain through session   Wound Therapy - Functional Problem List difficulty walking   Factors Delaying/Impairing Wound Healing Altered sensation;Diabetes Mellitus;Infection - systemic/local;Multiple medical problems;Vascular compromise   Hydrotherapy Plan Debridement;Dressing change;Patient/family education   Wound Therapy - Frequency --  2x/ week   Wound Therapy - Current Recommendations PT   Wound Plan Continue selective debridement for Bil LE wounds.  Assess gait mechanics with darco boots for safety.  Give other darco boot when arrives.     Dressing  medihoney f/b 2x2 ; kling and netting on Both LE      Problem List Patient Active Problem List   Diagnosis Date Noted  . Nonhealing skin ulcer 10/28/2014  . PAD (peripheral artery disease) 10/28/2014  . Bilateral carotid artery disease 07/08/2014  . Critical lower limb ischemia 02/28/2014  . Obstructive sleep apnea 02/12/2014  . Aftercare following surgery of the circulatory system, [place] 12/04/2013  . Carotid stenosis 11/29/2013  . Restless leg syndrome 10/23/2013  . Sleep apnea 10/23/2013  . Obesity (BMI 30.0-34.9) 10/23/2013  . Hyperlipidemia 07/16/2013  . Peripheral arterial disease 07/16/2013  . Occlusion and stenosis of  carotid artery without mention of cerebral infarction 04/24/2012  . History of stroke June 2013 03/14/2012  . Hypertension 03/14/2012  . Diabetes mellitus type 2 in obese 03/14/2012  . Peripheral neuropathy 03/14/2012  . Cardiomyopathy- EF NL 5/14 03/14/2012  . ARTHRITIS, RIGHT FOOT 06/26/2008   [name], Wilmer  [name] 11/21/2014, 3:17 PM  [place], [place], [address] Phone: [phone]   Fax:  [phone]

## 2014-11-26 ENCOUNTER — Encounter: Payer: Self-pay | Admitting: Cardiology

## 2014-11-26 ENCOUNTER — Telehealth: Payer: Self-pay | Admitting: *Deleted

## 2014-11-26 ENCOUNTER — Ambulatory Visit (INDEPENDENT_AMBULATORY_CARE_PROVIDER_SITE_OTHER): Payer: PPO | Admitting: Cardiology

## 2014-11-26 VITALS — BP 132/88 | HR 78 | Ht 69.25 in | Wt 236.6 lb

## 2014-11-26 DIAGNOSIS — I998 Other disorder of circulatory system: Secondary | ICD-10-CM

## 2014-11-26 DIAGNOSIS — E1159 Type 2 diabetes mellitus with other circulatory complications: Secondary | ICD-10-CM

## 2014-11-26 DIAGNOSIS — I70229 Atherosclerosis of native arteries of extremities with rest pain, unspecified extremity: Secondary | ICD-10-CM

## 2014-11-26 DIAGNOSIS — I739 Peripheral vascular disease, unspecified: Secondary | ICD-10-CM

## 2014-11-26 DIAGNOSIS — E785 Hyperlipidemia, unspecified: Secondary | ICD-10-CM

## 2014-11-26 DIAGNOSIS — I10 Essential (primary) hypertension: Secondary | ICD-10-CM

## 2014-11-26 MED ORDER — ASPIRIN EC 81 MG PO TBEC: 81.0000 mg | DELAYED_RELEASE_TABLET | Freq: Every day | ORAL | Status: DC

## 2014-11-26 NOTE — Assessment & Plan Note (Signed)
On oral medciations

## 2014-11-26 NOTE — Progress Notes (Signed)
11/26/2014 [name]   August 27, 1946  [id number]  Primary Physician HAWKINS,EDWARD L, MD Primary Cardiologist: Dr Gwenlyn Found  HPI:  69 y/o with a history of DM and PVD. The pt had an injury to the ball of his Rt foot that eventually led to an angiogram by Dr Gwenlyn Found and subsequent Rt PT PTA with diamondback HSRA in June 2015. The Lt LE had anterior tibial occlusion with 2V runoff. He also has a slow healing wound on his Lt great toe. He has been going to the wound center.             He is in the office today as a follow up from Dr Kennon Holter intervention on his Lt posterior tibial artery 10/31/14. Follow up dopplers showed ABI's > 1. His wounds seem to be healing.   Current Outpatient Prescriptions  Medication Sig Dispense Refill  . amLODipine (NORVASC) 10 MG tablet Take 10 mg by mouth daily.     [name] b complex vitamins tablet Take 1 tablet by mouth daily.    . clindamycin (CLEOCIN) 300 MG capsule Take 1 capsule (300 mg total) by mouth 4 (four) times daily. 20 capsule 0  . clopidogrel (PLAVIX) 75 MG tablet Take 75 mg by mouth daily.     . DULoxetine (CYMBALTA) 20 MG capsule Take 1 capsule by mouth 2 (two) times daily.    [name] gabapentin (NEURONTIN) 600 MG tablet Take 600 mg by mouth 4 (four) times daily.     [name] levothyroxine (SYNTHROID, LEVOTHROID) 25 MCG tablet Take 25 mcg by mouth daily.     . Misc Natural Products (OSTEO BI-FLEX JOINT SHIELD PO) Take 1 tablet by mouth 2 (two) times daily.    . mupirocin cream (BACTROBAN) 2 % Apply topically daily. 15 g 0  . oxyCODONE (ROXICODONE) 5 MG immediate release tablet Take 1 tablet (5 mg total) by mouth every 6 (six) hours as needed for severe pain. 30 tablet 0  . pravastatin (PRAVACHOL) 40 MG tablet Take 40 mg by mouth every morning.     [name] rOPINIRole (REQUIP) 0.5 MG tablet Take 1 tablet (0.5 mg total) by mouth at bedtime. 30 tablet 11  . saccharomyces boulardii (FLORASTOR) 250 MG capsule Take 1 capsule (250 mg total) by mouth 2 (two) times daily. 60 capsule  2  . aspirin EC 81 MG tablet Take 1 tablet (81 mg total) by mouth daily.     No current facility-administered medications for this visit.    Allergies  Allergen Reactions  . Codeine Nausea And Vomiting  . Tramadol Nausea Only    History   Social History  . Marital Status: Married    Spouse Name: N/A  . Number of Children: N/A  . Years of Education: N/A   Occupational History  . Not on file.   Social History Main Topics  . Smoking status: Former Smoker -- 1 years    Types: Pipe    Quit date: 07/08/1977  . Smokeless tobacco: Never Used  . Alcohol Use: No  . Drug Use: No  . Sexual Activity: Not on file   Other Topics Concern  . Not on file   Social History Narrative     Review of Systems: General: negative for chills, fever, night sweats or weight changes.  Cardiovascular: negative for chest pain, dyspnea on exertion, edema, orthopnea, palpitations, paroxysmal nocturnal dyspnea or shortness of breath Dermatological: negative for rash Respiratory: negative for cough or wheezing Urologic: negative for hematuria Abdominal: negative for nausea,  vomiting, diarrhea, bright red blood per rectum, melena, or hematemesis Neurologic: negative for visual changes, syncope, or dizziness All other systems reviewed and are otherwise negative except as noted above.    Blood pressure 132/88, pulse 78, height 5' 9.25" (1.759 m), weight 236 lb 9.6 oz (107.321 kg).  General appearance: alert, cooperative and no distress Lungs: clear to auscultation bilaterally Heart: regular rate and rhythm Extremities: healing open wound ball of his Rt foot  EKG NSR  ASSESSMENT AND PLAN:   Critical lower limb ischemia S/p Rt posterior tibial PTA 10/31/14. F/U dopplers show ABI's > 1.0 bilaterally.   Hypertension Controlled   Hyperlipidemia No recent lipids, on Pravachol secondary to cost   Type 2 diabetes mellitus with circulatory disorder On oral medciations    PLAN  Follow up  with Dr Gwenlyn Found 2 months. Check lipids and LFTs.  [name] KPA-C 11/26/2014 4:01 PM

## 2014-11-26 NOTE — Patient Instructions (Signed)
Your physician has recommended you make the following change in your medication: change the aspirin to 81 mg.  Your physician recommends that you return for lab work fasting.   Your physician recommends that you schedule a follow-up appointment in: MAY with Dr. Gwenlyn Found.

## 2014-11-26 NOTE — Assessment & Plan Note (Signed)
Controlled.  

## 2014-11-26 NOTE — Telephone Encounter (Signed)
Has appt today with Kerin Ransom, PA. Order placed for repeat LEA in 6 months.

## 2014-11-26 NOTE — Assessment & Plan Note (Signed)
No recent lipids, on Pravachol secondary to cost

## 2014-11-26 NOTE — Assessment & Plan Note (Signed)
S/p Rt posterior tibial PTA 10/31/14. F/U dopplers show ABI's > 1.0 bilaterally.

## 2014-11-26 NOTE — Telephone Encounter (Signed)
-----   Message from [name], MD sent at 11/19/2014  4:04 PM EST ----- Marked improvement in RABI s/p intervention. Repeat 6 months

## 2014-11-27 ENCOUNTER — Ambulatory Visit (HOSPITAL_COMMUNITY): Payer: PPO

## 2014-11-27 DIAGNOSIS — S91102D Unspecified open wound of left great toe without damage to nail, subsequent encounter: Secondary | ICD-10-CM | POA: Diagnosis not present

## 2014-11-27 DIAGNOSIS — M6289 Other specified disorders of muscle: Secondary | ICD-10-CM

## 2014-11-27 DIAGNOSIS — T148XXA Other injury of unspecified body region, initial encounter: Secondary | ICD-10-CM

## 2014-11-27 NOTE — Therapy (Signed)
[place], [place], [address] Phone: [phone]   Fax:  [phone]  Wound Care Therapy  Patient Details  Name: [name] MRN: [id number] Date of Birth: 1946-01-19 Referring Provider:  [name], MD  Encounter Date: 11/27/2014      PT End of Session - 11/27/14 1306    Visit Number 4   Number of Visits 16   Date for PT Re-Evaluation 01/13/15   Authorization Type medicare   PT Start Time 1025   PT Stop Time 1110   PT Time Calculation (min) 45 min   Activity Tolerance Patient tolerated treatment well   Behavior During Therapy [facility] for tasks assessed/performed      Past Medical History  Diagnosis Date  . Hypertension   . Diabetes mellitus   . Carotid artery occlusion   . Stroke March 08, 2012  . Hyperlipidemia   . Peripheral arterial disease     nonhealing ulcers bilaterally on each great toe  . PONV (postoperative nausea and vomiting)   . Arthritis   . Hypothyroidism   . Shortness of breath   . Obstructive sleep apnea     Past Surgical History  Procedure Laterality Date  . Back surgery    . Cervical fusion    . Foot surgery    . Spine surgery    . Eye surgery    . Endarterectomy Left 11/29/2013    Procedure: ENDARTERECTOMY CAROTID;  Surgeon: Serafina Mitchell, MD;  Location: [facility] OR;  Service: Vascular;  Laterality: Left;  . Carotid endarterectomy Left   . Pv angiogram  02/18/2014    tibial vessel diseas bil.  . Angioplasty  02/28/14    diamond back orbital rotational atherectomy of Rt. tibial  . Lower ext duplex doppler  03/14/14    Rt ABI 1.2  . Lower extremity angiogram Bilateral 02/18/2014    Procedure: LOWER EXTREMITY ANGIOGRAM;  Surgeon: [name], MD;  Location: [facility] CATH LAB;  Service: Cardiovascular;  Laterality: Bilateral;  . Lower extremity angiogram N/A 10/31/2014    Procedure: LOWER EXTREMITY ANGIOGRAM;  Surgeon: [name], MD;  Location: [facility] CATH LAB;  Service:  Cardiovascular;  Laterality: N/A;    There were no vitals taken for this visit.  Visit Diagnosis:  Nonhealing nonsurgical wound with necrosis of muscle      Subjective Assessment - 11/27/14 1258    Symptoms Pain free today,. reported apt with cardiologist who stated feet were looking good.     Currently in Pain? No/denies                   Wound Therapy - 11/27/14 1300    Subjective Pain free today,. reported apt with cardiologist who stated feet were looking good.     Patient and Family Stated Goals wounds to heal    Prior Treatments podiatrists self care.   Pain Assessment 0-10   Pain Score 0-No pain   Wound Properties Date First Assessed: 11/14/14 Time First Assessed: 1000 Wound Type: Diabetic ulcer Location: Toe (Comment  which one) Location Orientation: Left , Great Toe  Wound Description (Comments): Plantar aspect of Lt great toe  , tunneling noted  Present on Admission: Yes   Dressing Type Gauze (Comment);Hydrocolloid  Medihoney hydrovolloid   Dressing Changed Changed   Dressing Status Intact   Site / Wound Assessment Clean;Yellow;Other (Comment)  caloused   % Wound base Red or Granulating 65%   % Wound base Yellow  35%   Peri-wound Assessment Intact;Other (Comment)  calloused border   Drainage Amount Scant   Drainage Description Serous   Treatment Cleansed;Debridement (Selective)   Wound Properties Date First Assessed: 03/14/14 Time First Assessed: 1930 Wound Type: Diabetic ulcer Location: Other (Comment) , r big toe  Location Orientation: Right Wound Description (Comments): nonhealing wound on planar aspct of MTP of great toe  Present on Admission: Yes   Dressing Type Gauze (Comment);Hydrocolloid  Medihoney hydrovolloid   Dressing Changed Changed   Dressing Status Clean;Old drainage   Site / Wound Assessment Dusky   % Wound base Red or Granulating 65%   % Wound base Yellow 35%   Peri-wound Assessment --  calloused surrounding wound   Drainage Amount  Scant   Drainage Description Serous   Treatment Cleansed;Debridement (Selective)   Selective Debridement - Location wound and callouse areas around wounds   Selective Debridement - Tools Used Scalpel;Forceps   Selective Debridement - Tissue Removed slough and callous   Wound Therapy - Clinical Statement Able to remove vast amount of calloused surrounding wounds Bil foot to promote healing.  Pt entered dept wearing tennis shoes, pt educated on importance of pressure relief with darco boot and encouraged to wear all times, pt reported since wearing on Rt LE he is unable to drive with boot on but does wear around the house consistently.     Wound Therapy - Functional Problem List difficulty walking   Factors Delaying/Impairing Wound Healing Altered sensation;Diabetes Mellitus;Infection - systemic/local;Multiple medical problems;Vascular compromise   Hydrotherapy Plan Debridement;Dressing change;Patient/family education   Wound Therapy - Frequency --  2x week   Wound Plan Continue selective debridement for Bil LE wounds.  Assess gait mechanics with darco boots for safety.  Give other darco boot when arrives.     Dressing  medihoney f/b 2x2 ; kling and netting on Both LE   Decrease Necrotic Tissue to STG: 4 weeks: 0%   Decrease Necrotic Tissue - Progress Progressing toward goal   Increase Granulation Tissue to STG: 4 weeks 100%   Increase Granulation Tissue - Progress Progressing toward goal   Decrease Length/Width/Depth by (cm) STG: decrease Lt to have no tunnelling ; Rt to be decresed to 1.0x .75 x .1 STG for 4 weeks;  LTG healed 8 weeks   Decrease Length/Width/Depth - Progress Progressing toward goal   Improve Drainage Characteristics - Progress Progressing toward goal   Patient/Family will be able to  I in HEP to strengthen DF by 1  grade 8 weeks    Patient/Family Instruction Goal - Progress Met   Additional Wound Therapy Goal Pt to state it is easier to walk 8 weeks    Additional Wound  Therapy Goal - Progress Progressing toward goal   Wound Therapy - Potential for Goals Good            Problem List Patient Active Problem List   Diagnosis Date Noted  . Nonhealing skin ulcer 10/28/2014  . PAD (peripheral artery disease) 10/28/2014  . Bilateral carotid artery disease 07/08/2014  . Critical lower limb ischemia 02/28/2014  . Obstructive sleep apnea 02/12/2014  . Aftercare following surgery of the circulatory system, [place] 12/04/2013  . Carotid stenosis 11/29/2013  . Restless leg syndrome 10/23/2013  . Sleep apnea 10/23/2013  . Obesity (BMI 30.0-34.9) 10/23/2013  . Hyperlipidemia 07/16/2013  . Peripheral arterial disease 07/16/2013  . Occlusion and stenosis of carotid artery without mention of cerebral infarction 04/24/2012  . History of stroke June 2013 03/14/2012  .  Hypertension 03/14/2012  . Type 2 diabetes mellitus with circulatory disorder 03/14/2012  . Peripheral neuropathy 03/14/2012  . Cardiomyopathy- EF NL 5/14 03/14/2012  . ARTHRITIS, RIGHT FOOT 06/26/2008   [name], [place]  [name] 11/27/2014, 1:08 PM  [facility] [address] [place], [place], [address] Phone: [phone]   Fax:  [phone]

## 2014-11-28 LAB — COMPREHENSIVE METABOLIC PANEL
ALT: 13 U/L (ref 0–53)
AST: 13 U/L (ref 0–37)
Albumin: 4.3 g/dL (ref 3.5–5.2)
Alkaline Phosphatase: 81 U/L (ref 39–117)
BUN: 13 mg/dL (ref 6–23)
CO2: 28 mEq/L (ref 19–32)
Calcium: 8.7 mg/dL (ref 8.4–10.5)
Chloride: 104 mEq/L (ref 96–112)
Creat: 0.89 mg/dL (ref 0.50–1.35)
Glucose, Bld: 121 mg/dL — ABNORMAL HIGH (ref 70–99)
Potassium: 4 mEq/L (ref 3.5–5.3)
Sodium: 142 mEq/L (ref 135–145)
Total Bilirubin: 0.6 mg/dL (ref 0.2–1.2)
Total Protein: 7.1 g/dL (ref 6.0–8.3)

## 2014-11-28 LAB — LIPID PANEL
Cholesterol: 162 mg/dL (ref 0–200)
HDL: 42 mg/dL (ref >=40)
LDL Cholesterol: 95 mg/dL (ref 0–99)
Total CHOL/HDL Ratio: 3.9 Ratio
Triglycerides: 127 mg/dL (ref <150)
VLDL: 25 mg/dL (ref 0–40)

## 2014-11-29 ENCOUNTER — Ambulatory Visit (HOSPITAL_COMMUNITY): Payer: PPO

## 2014-11-29 DIAGNOSIS — S91102D Unspecified open wound of left great toe without damage to nail, subsequent encounter: Secondary | ICD-10-CM | POA: Diagnosis not present

## 2014-11-29 DIAGNOSIS — T148XXA Other injury of unspecified body region, initial encounter: Secondary | ICD-10-CM

## 2014-11-29 DIAGNOSIS — M6289 Other specified disorders of muscle: Secondary | ICD-10-CM

## 2014-11-29 NOTE — Therapy (Signed)
[place], [place], [address] Phone: [phone]   Fax:  [phone]  Wound Care Therapy  Patient Details  Name: RALSTON VENUS MRN: [id number] Date of Birth: 1945-11-24 Referring Provider:  [name], MD  Encounter Date: 11/29/2014      PT End of Session - 11/29/14 1903    Visit Number 5   Number of Visits 16   Date for PT Re-Evaluation 01/13/15   Authorization Type medicare   PT Start Time 1350   PT Stop Time 1430   PT Time Calculation (min) 40 min   Activity Tolerance Patient tolerated treatment well   Behavior During Therapy [facility] for tasks assessed/performed      Past Medical History  Diagnosis Date  . Hypertension   . Diabetes mellitus   . Carotid artery occlusion   . Stroke March 08, 2012  . Hyperlipidemia   . Peripheral arterial disease     nonhealing ulcers bilaterally on each great toe  . PONV (postoperative nausea and vomiting)   . Arthritis   . Hypothyroidism   . Shortness of breath   . Obstructive sleep apnea     Past Surgical History  Procedure Laterality Date  . Back surgery    . Cervical fusion    . Foot surgery    . Spine surgery    . Eye surgery    . Endarterectomy Left 11/29/2013    Procedure: ENDARTERECTOMY CAROTID;  Surgeon: Serafina Mitchell, MD;  Location: [facility] OR;  Service: Vascular;  Laterality: Left;  . Carotid endarterectomy Left   . Pv angiogram  02/18/2014    tibial vessel diseas bil.  . Angioplasty  02/28/14    diamond back orbital rotational atherectomy of Rt. tibial  . Lower ext duplex doppler  03/14/14    Rt ABI 1.2  . Lower extremity angiogram Bilateral 02/18/2014    Procedure: LOWER EXTREMITY ANGIOGRAM;  Surgeon: [name], MD;  Location: [facility] CATH LAB;  Service: Cardiovascular;  Laterality: Bilateral;  . Lower extremity angiogram N/A 10/31/2014    Procedure: LOWER EXTREMITY ANGIOGRAM;  Surgeon: [name], MD;  Location: [facility] CATH LAB;  Service:  Cardiovascular;  Laterality: N/A;    There were no vitals filed for this visit.  Visit Diagnosis:  Nonhealing nonsurgical wound with necrosis of muscle      Subjective Assessment - 11/29/14 1857    Symptoms Pain free today, dressing intact   Currently in Pain? No/denies                   Wound Therapy - 11/29/14 1857    Subjective Pain free today, dressing intact   Patient and Family Stated Goals wounds to heal    Prior Treatments podiatrists self care.   Pain Assessment 0-10   Pain Score 0-No pain   Wound Properties Date First Assessed: 11/14/14 Time First Assessed: 1000 Wound Type: Diabetic ulcer Location: Toe (Comment  which one) Location Orientation: Left , Great Toe  Wound Description (Comments): Plantar aspect of Lt great toe  , tunneling noted  Present on Admission: Yes   Dressing Type Gauze (Comment);Hydrocolloid  Medihoney hydrocolloid   Dressing Changed Changed   Dressing Status Intact   Site / Wound Assessment Clean;Yellow;Other (Comment)   % Wound base Red or Granulating 70%   % Wound base Yellow 30%   % Wound base Other (Comment) --  callouses surrounding wound   Peri-wound Assessment Intact;Other (Comment)   Undermining (  cm) all aspects   Drainage Amount Scant   Drainage Description Serous   Treatment Cleansed;Debridement (Selective)   Wound Properties Date First Assessed: 03/14/14 Time First Assessed: 1930 Wound Type: Diabetic ulcer Location: Other (Comment) , r big toe  Location Orientation: Right Wound Description (Comments): nonhealing wound on planar aspct of MTP of great toe  Present on Admission: Yes   Dressing Type Gauze (Comment);Hydrocolloid  callouses surrounding wound   Dressing Changed Changed   Dressing Status Clean;Old drainage   Site / Wound Assessment Dusky;Red;Yellow   % Wound base Red or Granulating 70%   % Wound base Yellow 30%   Peri-wound Assessment --  callouses surrounding wound   Drainage Amount Scant   Drainage  Description Serous   Treatment Cleansed;Debridement (Selective)   Selective Debridement - Location wound and callouse areas around wounds   Selective Debridement - Tools Used Scalpel;Forceps   Selective Debridement - Tissue Removed slough and callous   Wound Therapy - Clinical Statement Increased ease with removal of callouses surrounding wound Bil feet to promote healing.  Continues with the same dressings following increase ease.  No reports of pain through session.   Wound Therapy - Functional Problem List difficulty walking   Factors Delaying/Impairing Wound Healing Altered sensation;Diabetes Mellitus;Infection - systemic/local;Multiple medical problems;Vascular compromise   Hydrotherapy Plan Debridement;Dressing change;Patient/family education   Wound Therapy - Frequency --  2x week   Wound Therapy - Current Recommendations PT   Wound Plan Continue selective debridement for Bil LE wounds.  Assess gait mechanics with darco boots for safety.  Give other darco boot when arrives.     Dressing  medihoney f/b 2x2 ; kling and netting on Both LE   Decrease Necrotic Tissue to STG: 4 weeks: 0%   Decrease Necrotic Tissue - Progress Progressing toward goal   Increase Granulation Tissue to STG: 4 weeks 100%   Increase Granulation Tissue - Progress Progressing toward goal   Decrease Length/Width/Depth by (cm) STG: decrease Lt to have no tunnelling ; Rt to be decresed to 1.0x .75 x .1 STG for 4 weeks;  LTG healed 8 weeks   Decrease Length/Width/Depth - Progress Progressing toward goal   Patient/Family will be able to  I in HEP to strengthen DF by 1  grade 8 weeks    Additional Wound Therapy Goal Pt to state it is easier to walk 8 weeks    Additional Wound Therapy Goal - Progress Progressing toward goal   Wound Therapy - Potential for Goals Good           Problem List Patient Active Problem List   Diagnosis Date Noted  . Nonhealing skin ulcer 10/28/2014  . PAD (peripheral artery disease)  10/28/2014  . Bilateral carotid artery disease 07/08/2014  . Critical lower limb ischemia 02/28/2014  . Obstructive sleep apnea 02/12/2014  . Aftercare following surgery of the circulatory system, [place] 12/04/2013  . Carotid stenosis 11/29/2013  . Restless leg syndrome 10/23/2013  . Sleep apnea 10/23/2013  . Obesity (BMI 30.0-34.9) 10/23/2013  . Hyperlipidemia 07/16/2013  . Peripheral arterial disease 07/16/2013  . Occlusion and stenosis of carotid artery without mention of cerebral infarction 04/24/2012  . History of stroke June 2013 03/14/2012  . Hypertension 03/14/2012  . Type 2 diabetes mellitus with circulatory disorder 03/14/2012  . Peripheral neuropathy 03/14/2012  . Cardiomyopathy- EF NL 5/14 03/14/2012  . ARTHRITIS, RIGHT FOOT 06/26/2008   [name], [place]  [name] 11/29/2014, 7:04 PM  [facility]  [place], [place], [address] Phone: [phone]   Fax:  [phone]

## 2014-12-02 ENCOUNTER — Ambulatory Visit (HOSPITAL_COMMUNITY): Payer: PPO | Admitting: Physical Therapy

## 2014-12-02 DIAGNOSIS — T148XXA Other injury of unspecified body region, initial encounter: Secondary | ICD-10-CM

## 2014-12-02 DIAGNOSIS — M6289 Other specified disorders of muscle: Secondary | ICD-10-CM

## 2014-12-02 DIAGNOSIS — S91102D Unspecified open wound of left great toe without damage to nail, subsequent encounter: Secondary | ICD-10-CM | POA: Diagnosis not present

## 2014-12-02 NOTE — Therapy (Signed)
[place], [place], [address] Phone: [phone]   Fax:  [phone]  Wound Care Therapy  Patient Details  Name: [name] MRN: [id number] Date of Birth: 11/14/1945 Referring Provider:  [name], MD  Encounter Date: 12/02/2014      PT End of Session - 12/02/14 1333    Visit Number 6   Number of Visits 16   Date for PT Re-Evaluation 01/13/15   Authorization Type medicare   PT Start Time 0945   PT Stop Time 1024   PT Time Calculation (min) 39 min      Past Medical History  Diagnosis Date  . Hypertension   . Diabetes mellitus   . Carotid artery occlusion   . Stroke March 08, 2012  . Hyperlipidemia   . Peripheral arterial disease     nonhealing ulcers bilaterally on each great toe  . PONV (postoperative nausea and vomiting)   . Arthritis   . Hypothyroidism   . Shortness of breath   . Obstructive sleep apnea     Past Surgical History  Procedure Laterality Date  . Back surgery    . Cervical fusion    . Foot surgery    . Spine surgery    . Eye surgery    . Endarterectomy Left 11/29/2013    Procedure: ENDARTERECTOMY CAROTID;  Surgeon: Serafina Mitchell, MD;  Location: [facility] OR;  Service: Vascular;  Laterality: Left;  . Carotid endarterectomy Left   . Pv angiogram  02/18/2014    tibial vessel diseas bil.  . Angioplasty  02/28/14    diamond back orbital rotational atherectomy of Rt. tibial  . Lower ext duplex doppler  03/14/14    Rt ABI 1.2  . Lower extremity angiogram Bilateral 02/18/2014    Procedure: LOWER EXTREMITY ANGIOGRAM;  Surgeon: [name], MD;  Location: [facility] CATH LAB;  Service: Cardiovascular;  Laterality: Bilateral;  . Lower extremity angiogram N/A 10/31/2014    Procedure: LOWER EXTREMITY ANGIOGRAM;  Surgeon: [name], MD;  Location: [facility] CATH LAB;  Service: Cardiovascular;  Laterality: N/A;    There were no vitals filed for this visit.  Visit Diagnosis:  Nonhealing  nonsurgical wound with necrosis of muscle          Wound Therapy - 12/02/14 1328    Subjective Pt states his dressing fell off last time.     Patient and Family Stated Goals wounds to heal    Prior Treatments podiatrists self care.   Pain Score 0-No pain   Wound Properties Date First Assessed: 11/14/14 Time First Assessed: 1000 Wound Type: Diabetic ulcer Location: Toe (Comment  which one) Location Orientation: Left , Great Toe  Wound Description (Comments): Plantar aspect of Lt great toe  , tunneling noted  Present on Admission: Yes   Dressing Type Gauze (Comment);Hydrocolloid  Medihoney hydrocolloid   Dressing Changed Changed   Dressing Status Intact;Old drainage   Site / Wound Assessment Clean;Other (Comment)   % Wound base Red or Granulating 80%   % Wound base Yellow 20%   % Wound base Other (Comment) --  callouses surrounding wound   Peri-wound Assessment Intact;Other (Comment)   Drainage Amount Scant   Drainage Description Serous   Treatment Cleansed;Debridement (Selective)   Wound Properties Date First Assessed: 03/14/14 Time First Assessed: 1930 Wound Type: Diabetic ulcer Location: Other (Comment) , r big toe  Location Orientation: Right Wound Description (Comments): nonhealing wound on planar aspct of MTP of  great toe  Present on Admission: Yes   Dressing Type Gauze (Comment);Hydrocolloid  medihoney colloid    Dressing Changed Changed   Dressing Status Clean;Old drainage   Site / Wound Assessment Red;Yellow   % Wound base Red or Granulating 80%   % Wound base Yellow 20%   Peri-wound Assessment --  callouses surrounding wound   Drainage Amount Scant   Drainage Description Serous   Treatment Cleansed;Debridement (Selective)   Selective Debridement - Location wound and callouse areas around wounds   Selective Debridement - Tools Used Forceps;Scissors   Selective Debridement - Tissue Removed slough and callous   Wound Therapy - Clinical Statement Increased ease with  removal of callouses surrounding wound Bil feet to promote healing.  Continues with the same dressings following increase ease.  No reports of pain through session.   Wound Therapy - Functional Problem List difficulty walking   Factors Delaying/Impairing Wound Healing Altered sensation;Diabetes Mellitus;Infection - systemic/local;Multiple medical problems;Vascular compromise   Hydrotherapy Plan Debridement;Dressing change;Patient/family education   Wound Therapy - Frequency --  2x week   Wound Therapy - Current Recommendations PT   Wound Plan measure wound and tunneling next session    Dressing  medihoney f/b 2x2 ; kling and netting on Both LE   Decrease Necrotic Tissue to STG: 4 weeks: 0%   Decrease Necrotic Tissue - Progress Progressing toward goal   Increase Granulation Tissue to STG: 4 weeks 100%   Increase Granulation Tissue - Progress Progressing toward goal   Decrease Length/Width/Depth by (cm) STG: decrease Lt to have no tunnelling ; Rt to be decresed to 1.0x .75 x .1 STG for 4 weeks;  LTG healed 8 weeks   Decrease Length/Width/Depth - Progress Progressing toward goal   Patient/Family will be able to  I in HEP to strengthen DF by 1  grade 8 weeks    Patient/Family Instruction Goal - Progress Met   Additional Wound Therapy Goal Pt to state it is easier to walk 8 weeks    Additional Wound Therapy Goal - Progress Met   Wound Therapy - Potential for Goals Good       Problem List Patient Active Problem List   Diagnosis Date Noted  . Nonhealing skin ulcer 10/28/2014  . PAD (peripheral artery disease) 10/28/2014  . Bilateral carotid artery disease 07/08/2014  . Critical lower limb ischemia 02/28/2014  . Obstructive sleep apnea 02/12/2014  . Aftercare following surgery of the circulatory system, [place] 12/04/2013  . Carotid stenosis 11/29/2013  . Restless leg syndrome 10/23/2013  . Sleep apnea 10/23/2013  . Obesity (BMI 30.0-34.9) 10/23/2013  . Hyperlipidemia 07/16/2013  .  Peripheral arterial disease 07/16/2013  . Occlusion and stenosis of carotid artery without mention of cerebral infarction 04/24/2012  . History of stroke June 2013 03/14/2012  . Hypertension 03/14/2012  . Type 2 diabetes mellitus with circulatory disorder 03/14/2012  . Peripheral neuropathy 03/14/2012  . Cardiomyopathy- EF NL 5/14 03/14/2012  . ARTHRITIS, RIGHT FOOT 06/26/2008    RUSSELL,CINDY PT 12/02/2014, 1:34 PM  [place] [address] [place], [place], [address] Phone: [phone]   Fax:  [phone]

## 2014-12-05 ENCOUNTER — Ambulatory Visit (HOSPITAL_COMMUNITY): Payer: PPO | Admitting: Physical Therapy

## 2014-12-05 DIAGNOSIS — T148XXA Other injury of unspecified body region, initial encounter: Secondary | ICD-10-CM

## 2014-12-05 DIAGNOSIS — S91102D Unspecified open wound of left great toe without damage to nail, subsequent encounter: Secondary | ICD-10-CM | POA: Diagnosis not present

## 2014-12-05 DIAGNOSIS — M6289 Other specified disorders of muscle: Secondary | ICD-10-CM

## 2014-12-05 NOTE — Therapy (Deleted)
[place], [place], [address] Phone: [phone]   Fax:  [phone]  Wound Care Therapy  Patient Details  Name: [name] MRN: [id number] Date of Birth: July 02, 1946 Referring Provider:  [name], MD  Encounter Date: 12/05/2014      PT End of Session - 12/05/14 0952    Visit Number 7   Number of Visits 16   Date for PT Re-Evaluation 01/13/15   Authorization Type medicare   Authorization - Visit Number 7   Authorization - Number of Visits 10   PT Start Time 0845   PT Stop Time 0924   PT Time Calculation (min) 39 min   Activity Tolerance Patient tolerated treatment well   Behavior During Therapy [facility] for tasks assessed/performed      Past Medical History  Diagnosis Date  . Hypertension   . Diabetes mellitus   . Carotid artery occlusion   . Stroke March 08, 2012  . Hyperlipidemia   . Peripheral arterial disease     nonhealing ulcers bilaterally on each great toe  . PONV (postoperative nausea and vomiting)   . Arthritis   . Hypothyroidism   . Shortness of breath   . Obstructive sleep apnea     Past Surgical History  Procedure Laterality Date  . Back surgery    . Cervical fusion    . Foot surgery    . Spine surgery    . Eye surgery    . Endarterectomy Left 11/29/2013    Procedure: ENDARTERECTOMY CAROTID;  Surgeon: Serafina Mitchell, MD;  Location: [facility] OR;  Service: Vascular;  Laterality: Left;  . Carotid endarterectomy Left   . Pv angiogram  02/18/2014    tibial vessel diseas bil.  . Angioplasty  02/28/14    diamond back orbital rotational atherectomy of Rt. tibial  . Lower ext duplex doppler  03/14/14    Rt ABI 1.2  . Lower extremity angiogram Bilateral 02/18/2014    Procedure: LOWER EXTREMITY ANGIOGRAM;  Surgeon: [name], MD;  Location: [facility] CATH LAB;  Service: Cardiovascular;  Laterality: Bilateral;  . Lower extremity angiogram N/A 10/31/2014    Procedure: LOWER EXTREMITY ANGIOGRAM;   Surgeon: [name], MD;  Location: [facility] CATH LAB;  Service: Cardiovascular;  Laterality: N/A;    There were no vitals filed for this visit.  Visit Diagnosis:  Nonhealing nonsurgical wound with necrosis of muscle      Subjective Assessment - 12/05/14 0948    Symptoms Pt states no pain currently.   Currently in Pain? No/denies                   Wound Therapy - 12/05/14 0949    Subjective Pt states hes not having any pain.   Patient and Family Stated Goals wounds to heal    Prior Treatments podiatrists self care.   Pain Assessment 0-10   Pain Score 0-No pain   Wound Properties Date First Assessed: 11/14/14 Time First Assessed: 1000 Wound Type: Diabetic ulcer Location: Toe (Comment  which one) Location Orientation: Left , Great Toe  Wound Description (Comments): Plantar aspect of Lt great toe  , tunneling noted  Present on Admission: Yes   Dressing Type Gauze (Comment);Hydrocolloid  Medihoney hydrocolloid   Dressing Changed Changed   Dressing Status Intact;Old drainage   Site / Wound Assessment Clean;Other (Comment)   % Wound base Red or Granulating 80%   % Wound base Yellow 20%   %  Wound base Other (Comment) --  callouses surrounding wound   Peri-wound Assessment Intact;Other (Comment)   Wound Length (cm) 1 cm   Wound Width (cm) 0.3 cm   Wound Depth (cm) 0.3 cm   Undermining (cm) mostly inferior border approx 0.1 cm   Drainage Amount Scant   Drainage Description Serous   Treatment Cleansed;Debridement (Selective)   Wound Properties Date First Assessed: 03/14/14 Time First Assessed: 1930 Wound Type: Diabetic ulcer Location: Other (Comment) , r big toe  Location Orientation: Right Wound Description (Comments): nonhealing wound on planar aspct of MTP of great toe  Present on Admission: Yes   Dressing Type Gauze (Comment);Hydrocolloid  medihoney colloid    Dressing Changed Changed   Dressing Status Clean;Old drainage   Site / Wound Assessment Red;Yellow   % Wound  base Red or Granulating 90%   % Wound base Yellow --   % Wound base Other (Comment) --  callous 10%   Peri-wound Assessment --  callouses surrounding wound   Drainage Amount Minimal   Drainage Description Serous   Treatment Cleansed;Debridement (Selective)   Selective Debridement - Location wound and callouse areas around wounds   Selective Debridement - Tools Used Forceps;Scissors   Selective Debridement - Tissue Removed slough and callous   Wound Therapy - Clinical Statement Continues to have large amount of callous perimeter of wounds.  Debrided areas to promote approximation.  Cut medihoney colloid film to fit central area of wound without over lap.  Pt reported overall comfort with bandaging.    Wound Therapy - Functional Problem List difficulty walking   Factors Delaying/Impairing Wound Healing Altered sensation;Diabetes Mellitus;Infection - systemic/local;Multiple medical problems;Vascular compromise   Hydrotherapy Plan Debridement;Dressing change;Patient/family education   Wound Therapy - Frequency --  2x week   Wound Therapy - Current Recommendations PT   Wound Plan Continue with debridement and appropriate dressings.    Dressing  medihoney f/b 2x2 ; kling and netting on Both LE   Decrease Necrotic Tissue to STG: 4 weeks: 0%   Increase Granulation Tissue to STG: 4 weeks 100%   Decrease Length/Width/Depth by (cm) STG: decrease Lt to have no tunnelling ; Rt to be decresed to 1.0x .75 x .1 STG for 4 weeks;  LTG healed 8 weeks   Patient/Family will be able to  I in HEP to strengthen DF by 1  grade 8 weeks    Additional Wound Therapy Goal Pt to state it is easier to walk 8 weeks    Wound Therapy - Potential for Goals Good           Problem List Patient Active Problem List   Diagnosis Date Noted  . Nonhealing skin ulcer 10/28/2014  . PAD (peripheral artery disease) 10/28/2014  . Bilateral carotid artery disease 07/08/2014  . Critical lower limb ischemia 02/28/2014  .  Obstructive sleep apnea 02/12/2014  . Aftercare following surgery of the circulatory system, [place] 12/04/2013  . Carotid stenosis 11/29/2013  . Restless leg syndrome 10/23/2013  . Sleep apnea 10/23/2013  . Obesity (BMI 30.0-34.9) 10/23/2013  . Hyperlipidemia 07/16/2013  . Peripheral arterial disease 07/16/2013  . Occlusion and stenosis of carotid artery without mention of cerebral infarction 04/24/2012  . History of stroke June 2013 03/14/2012  . Hypertension 03/14/2012  . Type 2 diabetes mellitus with circulatory disorder 03/14/2012  . Peripheral neuropathy 03/14/2012  . Cardiomyopathy- EF NL 5/14 03/14/2012  . ARTHRITIS, RIGHT FOOT 06/26/2008    Teena Irani, PTA/CLT [phone] 12/05/2014, 9:53 AM  [place]  [name] [place], [place], [address] Phone: [phone]   Fax:  [phone]

## 2014-12-05 NOTE — Therapy (Signed)
[place], [place], [address] Phone: [phone]   Fax:  [phone]  Wound Care Therapy  Patient Details  Name: [name] MRN: [id number] Date of Birth: 10/07/1945 Referring Provider:  [name], MD  Encounter Date: 12/05/2014      PT End of Session - 12/05/14 0952    Visit Number 7   Number of Visits 16   Date for PT Re-Evaluation 01/13/15   Authorization Type medicare   Authorization - Visit Number 7   Authorization - Number of Visits 10   PT Start Time 0845   PT Stop Time 0924   PT Time Calculation (min) 39 min   Activity Tolerance Patient tolerated treatment well   Behavior During Therapy [facility] for tasks assessed/performed      Past Medical History  Diagnosis Date  . Hypertension   . Diabetes mellitus   . Carotid artery occlusion   . Stroke March 08, 2012  . Hyperlipidemia   . Peripheral arterial disease     nonhealing ulcers bilaterally on each great toe  . PONV (postoperative nausea and vomiting)   . Arthritis   . Hypothyroidism   . Shortness of breath   . Obstructive sleep apnea     Past Surgical History  Procedure Laterality Date  . Back surgery    . Cervical fusion    . Foot surgery    . Spine surgery    . Eye surgery    . Endarterectomy Left 11/29/2013    Procedure: ENDARTERECTOMY CAROTID;  Surgeon: Serafina Mitchell, MD;  Location: [facility] OR;  Service: Vascular;  Laterality: Left;  . Carotid endarterectomy Left   . Pv angiogram  02/18/2014    tibial vessel diseas bil.  . Angioplasty  02/28/14    diamond back orbital rotational atherectomy of Rt. tibial  . Lower ext duplex doppler  03/14/14    Rt ABI 1.2  . Lower extremity angiogram Bilateral 02/18/2014    Procedure: LOWER EXTREMITY ANGIOGRAM;  Surgeon: [name], MD;  Location: [facility] CATH LAB;  Service: Cardiovascular;  Laterality: Bilateral;  . Lower extremity angiogram N/A 10/31/2014    Procedure: LOWER EXTREMITY ANGIOGRAM;   Surgeon: [name], MD;  Location: [facility] CATH LAB;  Service: Cardiovascular;  Laterality: N/A;    There were no vitals filed for this visit.  Visit Diagnosis:  Nonhealing nonsurgical wound with necrosis of muscle      Subjective Assessment - 12/05/14 0948    Symptoms Pt states no pain currently.   Currently in Pain? No/denies                   Wound Therapy - 12/05/14 0949    Subjective Pt states hes not having any pain.   Patient and Family Stated Goals wounds to heal    Prior Treatments podiatrists self care.   Pain Assessment 0-10   Pain Score 0-No pain   Wound Properties Date First Assessed: 11/14/14 Time First Assessed: 1000 Wound Type: Diabetic ulcer Location: Toe (Comment  which one) Location Orientation: Left , Great Toe  Wound Description (Comments): Plantar aspect of Lt great toe  , tunneling noted  Present on Admission: Yes   Dressing Type Gauze (Comment);Hydrocolloid  Medihoney hydrocolloid   Dressing Changed Changed   Dressing Status Intact;Old drainage   Site / Wound Assessment Clean;Other (Comment)   % Wound base Red or Granulating 80%   % Wound base Yellow 20%   %  Wound base Other (Comment) --  callouses surrounding wound   Peri-wound Assessment Intact;Other (Comment)   Wound Length (cm) 1 cm   Wound Width (cm) 0.3 cm   Wound Depth (cm) 0.3 cm   Undermining (cm) mostly inferior border approx 0.1 cm   Drainage Amount Scant   Drainage Description Serous   Treatment Cleansed;Debridement (Selective)   Wound Properties Date First Assessed: 03/14/14 Time First Assessed: 1930 Wound Type: Diabetic ulcer Location: Other (Comment) , r big toe  Location Orientation: Right Wound Description (Comments): nonhealing wound on planar aspct of MTP of great toe  Present on Admission: Yes   Dressing Type Gauze (Comment);Hydrocolloid  medihoney colloid    Dressing Changed Changed   Dressing Status Clean;Old drainage   Site / Wound Assessment Red;Yellow   % Wound  base Red or Granulating 90%   % Wound base Yellow --   % Wound base Other (Comment) --  callous 10%   Peri-wound Assessment --  callouses surrounding wound   Wound Length (cm) 1.5 cm   Wound Width (cm) 1.5 cm   Wound Depth (cm) 0.2 cm   Drainage Amount Minimal   Drainage Description Serous   Treatment Cleansed;Debridement (Selective)   Selective Debridement - Location wound and callouse areas around wounds   Selective Debridement - Tools Used Forceps;Scissors   Selective Debridement - Tissue Removed slough and callous   Wound Therapy - Clinical Statement --   Wound Therapy - Functional Problem List difficulty walking   Factors Delaying/Impairing Wound Healing Altered sensation;Diabetes Mellitus;Infection - systemic/local;Multiple medical problems;Vascular compromise   Hydrotherapy Plan Debridement;Dressing change;Patient/family education   Wound Therapy Assessment Continues to have large amount of callous perimeter of wounds.  Debrided areas to promote approximation.  Cut medihoney colloid film to fit central area of wound without over lap.  Pt reported overall comfort with bandaging.       Wound Therapy - Current Recommendations PT   Wound Plan Continue wound care utilizing appropriate bandages and debridment.    Dressing  medihoney f/b 2x2 ; kling and netting on Both LE   Decrease Necrotic Tissue to STG: 4 weeks: 0%   Increase Granulation Tissue to STG: 4 weeks 100%   Decrease Length/Width/Depth by (cm) STG: decrease Lt to have no tunnelling ; Rt to be decresed to 1.0x .75 x .1 STG for 4 weeks;  LTG healed 8 weeks   Patient/Family will be able to  I in HEP to strengthen DF by 1  grade 8 weeks    Additional Wound Therapy Goal Pt to state it is easier to walk 8 weeks    Wound Therapy - Potential for Goals Good         Problem List Patient Active Problem List   Diagnosis Date Noted  . Nonhealing skin ulcer 10/28/2014  . PAD (peripheral artery disease) 10/28/2014  . Bilateral  carotid artery disease 07/08/2014  . Critical lower limb ischemia 02/28/2014  . Obstructive sleep apnea 02/12/2014  . Aftercare following surgery of the circulatory system, [place] 12/04/2013  . Carotid stenosis 11/29/2013  . Restless leg syndrome 10/23/2013  . Sleep apnea 10/23/2013  . Obesity (BMI 30.0-34.9) 10/23/2013  . Hyperlipidemia 07/16/2013  . Peripheral arterial disease 07/16/2013  . Occlusion and stenosis of carotid artery without mention of cerebral infarction 04/24/2012  . History of stroke June 2013 03/14/2012  . Hypertension 03/14/2012  . Type 2 diabetes mellitus with circulatory disorder 03/14/2012  . Peripheral neuropathy 03/14/2012  . Cardiomyopathy- EF NL 5/14 03/14/2012  .  ARTHRITIS, RIGHT FOOT 06/26/2008    Teena Irani, PTA/CLT [phone] 12/05/2014, 12:58 PM  [place] [address] [place], [place], [address] Phone: [phone]   Fax:  [phone]

## 2014-12-09 ENCOUNTER — Ambulatory Visit (HOSPITAL_COMMUNITY): Payer: PPO | Admitting: Physical Therapy

## 2014-12-09 DIAGNOSIS — M6289 Other specified disorders of muscle: Secondary | ICD-10-CM

## 2014-12-09 DIAGNOSIS — S91102D Unspecified open wound of left great toe without damage to nail, subsequent encounter: Secondary | ICD-10-CM | POA: Diagnosis not present

## 2014-12-09 DIAGNOSIS — T148XXA Other injury of unspecified body region, initial encounter: Secondary | ICD-10-CM

## 2014-12-09 NOTE — Therapy (Addendum)
[place], [place], [address] Phone: [phone]   Fax:  [phone]  Wound Care Therapy  Patient Details  Name: [name] MRN: [id number] Date of Birth: 1946-02-07 Referring Provider:  [name], MD  Encounter Date: 12/09/2014      PT End of Session - 12/09/14 1729    Visit Number 8   Number of Visits 16   Date for PT Re-Evaluation 01/13/15   Authorization Type medicare   Authorization - Visit Number 8   Authorization - Number of Visits 10   PT Start Time 0850   PT Stop Time 0930   PT Time Calculation (min) 40 min   Equipment Utilized During Treatment Gait belt   Activity Tolerance Patient tolerated treatment well   Behavior During Therapy [facility] for tasks assessed/performed      Past Medical History  Diagnosis Date  . Hypertension   . Diabetes mellitus   . Carotid artery occlusion   . Stroke March 08, 2012  . Hyperlipidemia   . Peripheral arterial disease     nonhealing ulcers bilaterally on each great toe  . PONV (postoperative nausea and vomiting)   . Arthritis   . Hypothyroidism   . Shortness of breath   . Obstructive sleep apnea     Past Surgical History  Procedure Laterality Date  . Back surgery    . Cervical fusion    . Foot surgery    . Spine surgery    . Eye surgery    . Endarterectomy Left 11/29/2013    Procedure: ENDARTERECTOMY CAROTID;  Surgeon: Serafina Mitchell, MD;  Location: [facility];  Service: Vascular;  Laterality: Left;  . Carotid endarterectomy Left   . Pv angiogram  02/18/2014    tibial vessel diseas bil.  . Angioplasty  02/28/14    diamond back orbital rotational atherectomy of Rt. tibial  . Lower ext duplex doppler  03/14/14    Rt ABI 1.2  . Lower extremity angiogram Bilateral 02/18/2014    Procedure: LOWER EXTREMITY ANGIOGRAM;  Surgeon: [name], MD;  Location: [facility] CATH LAB;  Service: Cardiovascular;  Laterality: Bilateral;  . Lower extremity angiogram N/A  10/31/2014    Procedure: LOWER EXTREMITY ANGIOGRAM;  Surgeon: [name], MD;  Location: [facility] CATH LAB;  Service: Cardiovascular;  Laterality: N/A;    There were no vitals filed for this visit.  Visit Diagnosis:  Nonhealing nonsurgical wound with necrosis of muscle                 Wound Therapy - 12/09/14 1724    Subjective Pt states hes not having any pain.   Patient and Family Stated Goals wounds to heal    Prior Treatments podiatrists self care.   Pain Assessment No/denies pain   Pain Score 0-No pain   Wound Properties Date First Assessed: 11/14/14 Time First Assessed: 1000 Wound Type: Diabetic ulcer Location: Toe (Comment  which one) Location Orientation: Left , Great Toe  Wound Description (Comments): Plantar aspect of Lt great toe  , tunneling noted  Present on Admission: Yes   Dressing Type Gauze (Comment);Hydrocolloid  Medihoney hydrocolloid   Dressing Changed Changed   Dressing Status Intact;Old drainage   Site / Wound Assessment Clean;Other (Comment)   % Wound base Red or Granulating 85%   % Wound base Yellow 15%   % Wound base Other (Comment) --  callouses surrounding wound   Peri-wound Assessment Intact;Other (Comment)   Wound  Length (cm) 1 cm   Wound Width (cm) 0.2 cm   Wound Depth (cm) 0.2 cm   Undermining (cm) inferioroy .3; laterally .4 cm    Drainage Amount Scant   Drainage Description Serous   Treatment Cleansed;Debridement (Selective)   Wound Properties Date First Assessed: 03/14/14 Time First Assessed: 1930 Wound Type: Diabetic ulcer Location: Other (Comment) , r big toe  Location Orientation: Right Wound Description (Comments): nonhealing wound on planar aspct of MTP of great toe  Present on Admission: Yes   Dressing Type Gauze (Comment);Hydrocolloid  medihoney colloid    Dressing Changed Changed   Dressing Status Clean;Old drainage   Site / Wound Assessment Red;Yellow   % Wound base Red or Granulating 90%   % Wound base Yellow 10%   %  Wound base Other (Comment) --  callous 10%   Peri-wound Assessment --  callouses surrounding wound   Wound Length (cm) 1.5 cm   Wound Width (cm) 1.4 cm   Wound Depth (cm) 0.2 cm   Drainage Amount Minimal   Drainage Description Serous   Treatment Cleansed;Debridement (Selective)   Selective Debridement - Location wound and callouse areas around wounds   Selective Debridement - Tools Used Forceps;Scissors   Selective Debridement - Tissue Removed slough and callous   Wound Therapy - Clinical Statement Pt callous and undernining of wounds are decreasing.  Pt urged to see a podiatrist to cut his toe nails; pt verbalized that he already had an appointmetnt.     Wound Therapy - Functional Problem List difficulty walking   Factors Delaying/Impairing Wound Healing Altered sensation;Diabetes Mellitus;Infection - systemic/local;Multiple medical problems;Vascular compromise   Hydrotherapy Plan Debridement;Dressing change;Patient/family education   Wound Therapy - Frequency --  2x week   Wound Therapy - Current Recommendations PT   Wound Plan measure wound and tunneling next session    Dressing  medihoney f/b 2x2 ; kling and netting on Both LE   Decrease Necrotic Tissue to STG: 4 weeks: 0%   Decrease Necrotic Tissue - Progress Progressing toward goal   Increase Granulation Tissue to STG: 4 weeks 100%   Increase Granulation Tissue - Progress Progressing toward goal   Decrease Length/Width/Depth by (cm) STG: decrease Lt to have no tunnelling ; Rt to be decresed to 1.0x .75 x .1 STG for 4 weeks;  LTG healed 8 weeks   Patient/Family will be able to  I in HEP to strengthen DF by 1  grade 8 weeks    Patient/Family Instruction Goal - Progress Met   Additional Wound Therapy Goal Pt to state it is easier to walk 8 weeks    Additional Wound Therapy Goal - Progress Progressing toward goal   Wound Therapy - Potential for Goals Good                 PT Education - 12/09/14 1729    Education  provided Yes   Education Details the need to have pt toenaills clipped by a podiatrist    Person(s) Educated Patient   Methods Explanation   Comprehension Verbalized understanding         Problem List Patient Active Problem List   Diagnosis Date Noted  . Nonhealing skin ulcer 10/28/2014  . PAD (peripheral artery disease) 10/28/2014  . Bilateral carotid artery disease 07/08/2014  . Critical lower limb ischemia 02/28/2014  . Obstructive sleep apnea 02/12/2014  . Aftercare following surgery of the circulatory system, [place] 12/04/2013  . Carotid stenosis 11/29/2013  . Restless leg syndrome 10/23/2013  .  Sleep apnea 10/23/2013  . Obesity (BMI 30.0-34.9) 10/23/2013  . Hyperlipidemia 07/16/2013  . Peripheral arterial disease 07/16/2013  . Occlusion and stenosis of carotid artery without mention of cerebral infarction 04/24/2012  . History of stroke June 2013 03/14/2012  . Hypertension 03/14/2012  . Type 2 diabetes mellitus with circulatory disorder 03/14/2012  . Peripheral neuropathy 03/14/2012  . Cardiomyopathy- EF NL 5/14 03/14/2012  . ARTHRITIS, RIGHT FOOT 06/26/2008   Rayetta Humphrey PT/ [phone]  12/09/2014, 5:31 PM  [facility] [address] [place], [place], [address] Phone: [phone]   Fax:  [phone]

## 2014-12-11 ENCOUNTER — Encounter: Payer: Self-pay | Admitting: Podiatry

## 2014-12-11 ENCOUNTER — Ambulatory Visit (INDEPENDENT_AMBULATORY_CARE_PROVIDER_SITE_OTHER): Payer: PPO | Admitting: Podiatry

## 2014-12-11 VITALS — BP 142/69 | HR 72 | Resp 12

## 2014-12-11 DIAGNOSIS — E1151 Type 2 diabetes mellitus with diabetic peripheral angiopathy without gangrene: Secondary | ICD-10-CM

## 2014-12-11 DIAGNOSIS — B351 Tinea unguium: Secondary | ICD-10-CM | POA: Diagnosis not present

## 2014-12-11 NOTE — Progress Notes (Signed)
   Subjective:    Patient ID: [name], male    DOB: 01-12-1946, 69 y.o.   MRN: [id number]  HPI  N-THICK, DISCOLORATION L-B/L TOENAILS  D-LONG-TERM O-SLOWLY C-SAME A-N/A T-TRIM  This patient is under treatment care of wound care center for bilateral diabetic foot ulcers with a history of diabetes and peripheral arterial disease. Today's complaining of of uncomfortable toenails and is requesting debridement  Review of Systems  Musculoskeletal: Positive for gait problem.  Skin: Positive for color change.  All other systems reviewed and are negative.      Objective:   Physical Exam  Right and left feet have bandages covering the feet from the ankles to the forefoot area. The toenails are elongated, brittle, incurvated, discolored 6-10      Assessment & Plan:   Assessment: Diabetic foot ulcers bilaterally under management of wound care center Diabetic with peripheral arterial disease Mycotic toenails 6-10  Plan: Debrided toenails 10 without any  Bleeding Wound care provided by wound care center  Reappoint 3 months

## 2014-12-11 NOTE — Patient Instructions (Signed)
Diabetes and Foot Care Diabetes may cause you to have problems because of poor blood supply (circulation) to your feet and legs. This may cause the skin on your feet to become thinner, break easier, and heal more slowly. Your skin may become dry, and the skin may peel and crack. You may also have nerve damage in your legs and feet causing decreased feeling in them. You may not notice minor injuries to your feet that could lead to infections or more serious problems. Taking care of your feet is one of the most important things you can do for yourself.  HOME CARE INSTRUCTIONS  Wear shoes at all times, even in the house. Do not go barefoot. Bare feet are easily injured.  Check your feet daily for blisters, cuts, and redness. If you cannot see the bottom of your feet, use a mirror or ask someone for help.  Wash your feet with warm water (do not use hot water) and mild soap. Then pat your feet and the areas between your toes until they are completely dry. Do not soak your feet as this can dry your skin.  Apply a moisturizing lotion or petroleum jelly (that does not contain alcohol and is unscented) to the skin on your feet and to dry, brittle toenails. Do not apply lotion between your toes.  Trim your toenails straight across. Do not dig under them or around the cuticle. File the edges of your nails with an emery board or nail file.  Do not cut corns or calluses or try to remove them with medicine.  Wear clean socks or stockings every day. Make sure they are not too tight. Do not wear knee-high stockings since they may decrease blood flow to your legs.  Wear shoes that fit properly and have enough cushioning. To break in new shoes, wear them for just a few hours a day. This prevents you from injuring your feet. Always look in your shoes before you put them on to be sure there are no objects inside.  Do not cross your legs. This may decrease the blood flow to your feet.  If you find a minor scrape,  cut, or break in the skin on your feet, keep it and the skin around it clean and dry. These areas may be cleansed with mild soap and water. Do not cleanse the area with peroxide, alcohol, or iodine.  When you remove an adhesive bandage, be sure not to damage the skin around it.  If you have a wound, look at it several times a day to make sure it is healing.  Do not use heating pads or hot water bottles. They may burn your skin. If you have lost feeling in your feet or legs, you may not know it is happening until it is too late.  Make sure your health care provider performs a complete foot exam at least annually or more often if you have foot problems. Report any cuts, sores, or bruises to your health care provider immediately. SEEK MEDICAL CARE IF:   You have an injury that is not healing.  You have cuts or breaks in the skin.  You have an ingrown nail.  You notice redness on your legs or feet.  You feel burning or tingling in your legs or feet.  You have pain or cramps in your legs and feet.  Your legs or feet are numb.  Your feet always feel cold. SEEK IMMEDIATE MEDICAL CARE IF:   There is increasing redness,   swelling, or pain in or around a wound.  There is a red line that goes up your leg.  Pus is coming from a wound.  You develop a fever or as directed by your health care provider.  You notice a bad smell coming from an ulcer or wound. Document Released: 09/03/2000 Document Revised: 05/09/2013 Document Reviewed: 02/13/2013 ExitCare Patient Information 2015 [employer]. This information is not intended to replace advice given to you by your health care provider. Make sure you discuss any questions you have with your health care provider.  

## 2014-12-12 ENCOUNTER — Ambulatory Visit (HOSPITAL_COMMUNITY): Payer: PPO | Admitting: Physical Therapy

## 2014-12-12 DIAGNOSIS — M6289 Other specified disorders of muscle: Secondary | ICD-10-CM

## 2014-12-12 DIAGNOSIS — T148XXA Other injury of unspecified body region, initial encounter: Secondary | ICD-10-CM

## 2014-12-12 DIAGNOSIS — S91102D Unspecified open wound of left great toe without damage to nail, subsequent encounter: Secondary | ICD-10-CM | POA: Diagnosis not present

## 2014-12-12 NOTE — Therapy (Signed)
[facility] [address] [place], [place], [address] Phone: [phone]   Fax:  [phone]  Wound Care Therapy  Patient Details  Name: [name] MRN: [id number] Date of Birth: May 15, 1946 Referring Provider:  Kari Baars, MD  Encounter Date: 12/12/2014      PT End of Session - 12/12/14 1213    Visit Number 9   Number of Visits 16   Date for PT Re-Evaluation 01/13/15   Authorization Type medicare   Authorization - Visit Number 9   Authorization - Number of Visits 10   PT Start Time 0850   PT Stop Time 0930   PT Time Calculation (min) 40 min   Activity Tolerance Patient tolerated treatment well   Behavior During Therapy [facility] for tasks assessed/performed      Past Medical History  Diagnosis Date  . Hypertension   . Diabetes mellitus   . Carotid artery occlusion   . Stroke March 08, 2012  . Hyperlipidemia   . Peripheral arterial disease     nonhealing ulcers bilaterally on each great toe  . PONV (postoperative nausea and vomiting)   . Arthritis   . Hypothyroidism   . Shortness of breath   . Obstructive sleep apnea     Past Surgical History  Procedure Laterality Date  . Back surgery    . Cervical fusion    . Foot surgery    . Spine surgery    . Eye surgery    . Endarterectomy Left 11/29/2013    Procedure: ENDARTERECTOMY CAROTID;  Surgeon: [name], MD;  Location: [facility] OR;  Service: Vascular;  Laterality: Left;  . Carotid endarterectomy Left   . Pv angiogram  02/18/2014    tibial vessel diseas bil.  . Angioplasty  02/28/14    diamond back orbital rotational atherectomy of Rt. tibial  . Lower ext duplex doppler  03/14/14    Rt ABI 1.2  . Lower extremity angiogram Bilateral 02/18/2014    Procedure: LOWER EXTREMITY ANGIOGRAM;  Surgeon: Runell Gess, MD;  Location: [facility] [place] [facility] CATH LAB;  Service: Cardiovascular;  Laterality: Bilateral;  . Lower extremity angiogram N/A 10/31/2014    Procedure: LOWER EXTREMITY ANGIOGRAM;   Surgeon: Runell Gess, MD;  Location: [facility] CATH LAB;  Service: Cardiovascular;  Laterality: N/A;    There were no vitals filed for this visit.  Visit Diagnosis:  Nonhealing nonsurgical wound with necrosis of muscle         Wound Therapy - 12/12/14 1206    Subjective Pt states that he went to the podiatrist and got his nails trimmed    Patient and Family Stated Goals wounds to heal    Prior Treatments podiatrists self care.   Pain Assessment No/denies pain   Pain Score 0-No pain   Wound Properties Date First Assessed: 11/14/14 Time First Assessed: 1000 Wound Type: Diabetic ulcer Location: Toe (Comment  which one) Location Orientation: Left , Great Toe  Wound Description (Comments): Plantar aspect of Lt great toe  , tunneling noted  Present on Admission: Yes   Dressing Type Gauze (Comment)  vaseline placed over callous with medihoney on woundbed    Dressing Changed Changed   Dressing Status Intact;Old drainage   Dressing Change Frequency --  twice a week   Site / Wound Assessment Clean;Other (Comment)   % Wound base Red or Granulating 90%   % Wound base Yellow 10%   % Wound base Other (Comment) --  callouses surrounding wound  Peri-wound Assessment Intact;Other (Comment)  calloused    Wound Length (cm) 1 cm   Wound Width (cm) 0.2 cm   Wound Depth (cm) 0.2 cm   Drainage Amount Scant   Drainage Description Serous   Treatment Cleansed;Debridement (Selective)   Wound Properties Date First Assessed: 03/14/14 Time First Assessed: 1930 Wound Type: Diabetic ulcer Location: Other (Comment) , r big toe  Location Orientation: Right Wound Description (Comments): nonhealing wound on planar aspct of MTP of great toe  Present on Admission: Yes   Dressing Type Gauze (Comment);Hydrocolloid  medihoney on wound; vaseline to callous area   Dressing Changed Changed   Dressing Status Clean;Old drainage   Dressing Change Frequency --  twice a week   Site / Wound Assessment Red;Yellow   %  Wound base Red or Granulating 90%   % Wound base Yellow 10%   % Wound base Other (Comment) --   Peri-wound Assessment --  callouses surrounding wound   Wound Length (cm) 1.5 cm   Wound Width (cm) 1.4 cm   Wound Depth (cm) 0.2 cm   Drainage Amount Minimal   Drainage Description Serous   Treatment Cleansed;Debridement (Selective)   Selective Debridement - Location wound and callouse areas around wounds   Selective Debridement - Tools Used Forceps;Scissors   Selective Debridement - Tissue Removed slough and callous   Wound Therapy - Clinical Statement Significant amount of callous was able to be debrided today.  Due to decreased drainage therapist changed from honeycollid to honey.    Wound Therapy - Functional Problem List difficulty walking   Factors Delaying/Impairing Wound Healing Altered sensation;Diabetes Mellitus;Infection - systemic/local;Multiple medical problems;Vascular compromise   Hydrotherapy Plan Debridement;Dressing change;Patient/family education   Wound Therapy - Frequency --  2x week   Wound Therapy - Current Recommendations PT   Wound Plan measure wound and tunneling next session    Dressing  medihoney f/b 2x2 ; kling and netting on Both LE   Decrease Necrotic Tissue to STG: 4 weeks: 0%   Decrease Necrotic Tissue - Progress Progressing toward goal   Increase Granulation Tissue to STG: 4 weeks 100%   Increase Granulation Tissue - Progress Progressing toward goal   Decrease Length/Width/Depth by (cm) STG: decrease Lt to have no tunnelling ; Rt to be decresed to 1.0x .75 x .1 STG for 4 weeks;  LTG healed 8 weeks   Patient/Family will be able to  I in HEP to strengthen DF by 1  grade 8 weeks    Patient/Family Instruction Goal - Progress Met   Additional Wound Therapy Goal Pt to state it is easier to walk 8 weeks    Additional Wound Therapy Goal - Progress Progressing toward goal   Wound Therapy - Potential for Goals Good              PT Education - 12/12/14 1213     Education provided Yes   Education Details Keep pressure off of wounds as much as possible.  PT was given a Darco boot but does not like wearing it.    Person(s) Educated Patient   Methods Explanation   Comprehension Verbalized understanding           Problem List Patient Active Problem List   Diagnosis Date Noted  . Nonhealing skin ulcer 10/28/2014  . PAD (peripheral artery disease) 10/28/2014  . Bilateral carotid artery disease 07/08/2014  . Critical lower limb ischemia 02/28/2014  . Obstructive sleep apnea 02/12/2014  . Aftercare following surgery of the circulatory system,  [place] 12/04/2013  . Carotid stenosis 11/29/2013  . Restless leg syndrome 10/23/2013  . Sleep apnea 10/23/2013  . Obesity (BMI 30.0-34.9) 10/23/2013  . Hyperlipidemia 07/16/2013  . Peripheral arterial disease 07/16/2013  . Occlusion and stenosis of carotid artery without mention of cerebral infarction 04/24/2012  . History of stroke June 2013 03/14/2012  . Hypertension 03/14/2012  . Type 2 diabetes mellitus with circulatory disorder 03/14/2012  . Peripheral neuropathy 03/14/2012  . Cardiomyopathy- EF NL 5/14 03/14/2012  . ARTHRITIS, RIGHT FOOT 06/26/2008    Rayetta Humphrey PT  [phone]  12/12/2014, 12:15 PM  [facility] [address] [place], [place], [address] Phone: [phone]   Fax:  [phone]

## 2014-12-16 ENCOUNTER — Ambulatory Visit (HOSPITAL_COMMUNITY): Payer: PPO | Admitting: Physical Therapy

## 2014-12-16 DIAGNOSIS — M6289 Other specified disorders of muscle: Secondary | ICD-10-CM

## 2014-12-16 DIAGNOSIS — S91102D Unspecified open wound of left great toe without damage to nail, subsequent encounter: Secondary | ICD-10-CM | POA: Diagnosis not present

## 2014-12-16 DIAGNOSIS — T148XXA Other injury of unspecified body region, initial encounter: Secondary | ICD-10-CM

## 2014-12-16 NOTE — Therapy (Signed)
[facility] [address] [place], [place], [address] Phone: [phone]   Fax:  [phone]  Wound Care Therapy  Patient Details  Name: [name] MRN: [id number] Date of Birth: 09/01/1946 Referring Provider:  Hawkins, Edward, MD  Encounter Date: 12/16/2014      PT End of Session - 12/16/14 1132    Visit Number 10   Number of Visits 16   Date for PT Re-Evaluation 01/13/15   Authorization Type medicare   Authorization - Visit Number 10   Authorization - Number of Visits 20   PT Start Time 0851   PT Stop Time 0935   PT Time Calculation (min) 44 min   Activity Tolerance Patient tolerated treatment well   Behavior During Therapy WFL for tasks assessed/performed      Past Medical History  Diagnosis Date  . Hypertension   . Diabetes mellitus   . Carotid artery occlusion   . Stroke March 08, 2012  . Hyperlipidemia   . Peripheral arterial disease     nonhealing ulcers bilaterally on each great toe  . PONV (postoperative nausea and vomiting)   . Arthritis   . Hypothyroidism   . Shortness of breath   . Obstructive sleep apnea     Past Surgical History  Procedure Laterality Date  . Back surgery    . Cervical fusion    . Foot surgery    . Spine surgery    . Eye surgery    . Endarterectomy Left 11/29/2013    Procedure: ENDARTERECTOMY CAROTID;  Surgeon: Vance W Brabham, MD;  Location: [facility] OR;  Service: Vascular;  Laterality: Left;  . Carotid endarterectomy Left   . Pv angiogram  02/18/2014    tibial vessel diseas bil.  . Angioplasty  02/28/14    diamond back orbital rotational atherectomy of Rt. tibial  . Lower ext duplex doppler  03/14/14    Rt ABI 1.2  . Lower extremity angiogram Bilateral 02/18/2014    Procedure: LOWER EXTREMITY ANGIOGRAM;  Surgeon: Jonathan J Berry, MD;  Location: [facility] CATH LAB;  Service: Cardiovascular;  Laterality: Bilateral;  . Lower extremity angiogram N/A 10/31/2014    Procedure: LOWER EXTREMITY  ANGIOGRAM;  Surgeon: Jonathan J Berry, MD;  Location: [facility] CATH LAB;  Service: Cardiovascular;  Laterality: N/A;    There were no vitals filed for this visit.  Visit Diagnosis:  Nonhealing nonsurgical wound with necrosis of muscle           Wound Therapy - 12/16/14 1126    Subjective Pt states that his restless leg syndrome has been acting up this weekend.  Pt states his feet feel better than they have in a while.    Patient and Family Stated Goals wounds to heal    Prior Treatments podiatrists self care.   Pain Assessment No/denies pain   Pain Score 0-No pain   Wound Properties Date First Assessed: 11/14/14 Time First Assessed: 1000 Wound Type: Diabetic ulcer Location: Toe (Comment  which one) Location Orientation: Left , Great Toe  Wound Description (Comments): Plantar aspect of Lt great toe  , tunneling noted  Present on Admission: Yes   Dressing Type Gauze (Comment)  vaseline placed over callous with medihoney on woundbed    Dressing Changed Changed   Dressing Status Intact;Old drainage   Dressing Change Frequency --  twice a week   Site / Wound Assessment Clean;Other (Comment)   % Wound base Red or Granulating 95%   % Wound base   Yellow 5%   % Wound base Other (Comment) --  callouses surrounding wound   Peri-wound Assessment Intact;Other (Comment)  calloused    Wound Length (cm) 0.8 cm   Wound Width (cm) 0.2 cm   Wound Depth (cm) 0.2 cm   Drainage Amount Scant   Drainage Description Serous   Wound Properties Date First Assessed: 03/14/14 Time First Assessed: 1930 Wound Type: Diabetic ulcer Location: Other (Comment) , r big toe  Location Orientation: Right Wound Description (Comments): nonhealing wound on planar aspct of MTP of great toe  Present on Admission: Yes   Dressing Type Gauze (Comment)  medihoney on wound; vaseline to callous area   Dressing Changed Changed   Dressing Status Clean;Old drainage   Dressing Change Frequency --  twice a week   Site / Wound  Assessment Red;Yellow   % Wound base Red or Granulating 95%   % Wound base Yellow 5%   Peri-wound Assessment --  callouses surrounding wound   Wound Length (cm) 1.5 cm   Wound Width (cm) 1.4 cm   Wound Depth (cm) 0.2 cm   Drainage Amount Minimal   Drainage Description Serous   Treatment Cleansed;Debridement (Selective)   Selective Debridement - Location wound and callouse areas around wounds   Selective Debridement - Tools Used Forceps;Scissors   Selective Debridement - Tissue Removed slough and callous   Wound Therapy - Clinical Statement Difficult to debride today secondary to pt restless leg syndrome,(therapist would be debriding and then pt leg would jerk), Debridement to callous surrounding wound bed.    Wound Therapy - Functional Problem List difficulty walking   Factors Delaying/Impairing Wound Healing Altered sensation;Diabetes Mellitus;Infection - systemic/local;Multiple medical problems;Vascular compromise   Hydrotherapy Plan Debridement;Dressing change;Patient/family education   Wound Therapy - Frequency --  2x week   Wound Therapy - Current Recommendations PT   Wound Plan Encourage pt to wear most current diabetic shoes, pt is weariong older ones and keeps his newer pair for "dressing up":  Continue with debridement and dressing change.    Dressing  medihoney f/b 2x2 ; kling and netting on Both LE   Decrease Necrotic Tissue to STG: 4 weeks: 0%   Decrease Necrotic Tissue - Progress Progressing toward goal   Increase Granulation Tissue to STG: 4 weeks 100%   Increase Granulation Tissue - Progress Progressing toward goal   Decrease Length/Width/Depth by (cm) STG: decrease Lt to have no tunnelling ; Rt to be decresed to 1.0x .75 x .1 STG for 4 weeks;  LTG healed 8 weeks   Decrease Length/Width/Depth - Progress Progressing toward goal   Patient/Family will be able to  I in HEP to strengthen DF by 1  grade 8 weeks    Patient/Family Instruction Goal - Progress Met   Additional  Wound Therapy Goal Pt to state it is easier to walk 8 weeks    Additional Wound Therapy Goal - Progress Met   Wound Therapy - Potential for Goals Good           PT Education - 12/16/14 1132    Education provided Yes   Education Details importance of wearing new diabetic shoes    Person(s) Educated Patient   Methods Explanation   Comprehension Verbalized understanding             G-Codes - 12/16/14 1133    Functional Limitation Other PT primary   Other PT Primary Current Status (G8990) At least 20 percent but less than 40 percent impaired, limited or restricted     Other PT Primary Goal Status (G8991) At least 1 percent but less than 20 percent impaired, limited or restricted       Problem List Patient Active Problem List   Diagnosis Date Noted  . Nonhealing skin ulcer 10/28/2014  . PAD (peripheral artery disease) 10/28/2014  . Bilateral carotid artery disease 07/08/2014  . Critical lower limb ischemia 02/28/2014  . Obstructive sleep apnea 02/12/2014  . Aftercare following surgery of the circulatory system, [facility] 12/04/2013  . Carotid stenosis 11/29/2013  . Restless leg syndrome 10/23/2013  . Sleep apnea 10/23/2013  . Obesity (BMI 30.0-34.9) 10/23/2013  . Hyperlipidemia 07/16/2013  . Peripheral arterial disease 07/16/2013  . Occlusion and stenosis of carotid artery without mention of cerebral infarction 04/24/2012  . History of stroke June 2013 03/14/2012  . Hypertension 03/14/2012  . Type 2 diabetes mellitus with circulatory disorder 03/14/2012  . Peripheral neuropathy 03/14/2012  . Cardiomyopathy- EF NL 5/14 03/14/2012  . ARTHRITIS, RIGHT FOOT 06/26/2008   Cynthia Russell PT [phone] 12/16/2014, 11:34 AM  [facility] [address] [place], [place], [address] Phone: [phone]   Fax:  [phone]       

## 2014-12-19 ENCOUNTER — Ambulatory Visit (HOSPITAL_COMMUNITY): Payer: PPO | Admitting: Physical Therapy

## 2014-12-19 DIAGNOSIS — T148XXA Other injury of unspecified body region, initial encounter: Secondary | ICD-10-CM

## 2014-12-19 DIAGNOSIS — S91102D Unspecified open wound of left great toe without damage to nail, subsequent encounter: Secondary | ICD-10-CM | POA: Diagnosis not present

## 2014-12-19 DIAGNOSIS — M6289 Other specified disorders of muscle: Secondary | ICD-10-CM

## 2014-12-19 NOTE — Therapy (Signed)
[place], [place], [address] Phone: [phone]   Fax:  [phone]  Wound Care Therapy  Patient Details  Name: [name] MRN: [id number] Date of Birth: Oct 13, 1945 Referring Provider:  [name], MD  Encounter Date: 12/19/2014      PT End of Session - 12/19/14 1203    Visit Number 11   Number of Visits 16   Date for PT Re-Evaluation 01/13/15   Authorization Type medicare   Authorization - Visit Number 11   Authorization - Number of Visits 20   PT Start Time 5885   PT Stop Time 1020   PT Time Calculation (min) 45 min   Activity Tolerance Patient tolerated treatment well   Behavior During Therapy [facility] for tasks assessed/performed      Past Medical History  Diagnosis Date  . Hypertension   . Diabetes mellitus   . Carotid artery occlusion   . Stroke March 08, 2012  . Hyperlipidemia   . Peripheral arterial disease     nonhealing ulcers bilaterally on each great toe  . PONV (postoperative nausea and vomiting)   . Arthritis   . Hypothyroidism   . Shortness of breath   . Obstructive sleep apnea     Past Surgical History  Procedure Laterality Date  . Back surgery    . Cervical fusion    . Foot surgery    . Spine surgery    . Eye surgery    . Endarterectomy Left 11/29/2013    Procedure: ENDARTERECTOMY CAROTID;  Surgeon: Serafina Mitchell, MD;  Location: [facility];  Service: Vascular;  Laterality: Left;  . Carotid endarterectomy Left   . Pv angiogram  02/18/2014    tibial vessel diseas bil.  . Angioplasty  02/28/14    diamond back orbital rotational atherectomy of Rt. tibial  . Lower ext duplex doppler  03/14/14    Rt ABI 1.2  . Lower extremity angiogram Bilateral 02/18/2014    Procedure: LOWER EXTREMITY ANGIOGRAM;  Surgeon: [name], MD;  Location: [facility] CATH LAB;  Service: Cardiovascular;  Laterality: Bilateral;  . Lower extremity angiogram N/A 10/31/2014    Procedure: LOWER EXTREMITY  ANGIOGRAM;  Surgeon: [name], MD;  Location: [facility] CATH LAB;  Service: Cardiovascular;  Laterality: N/A;    There were no vitals filed for this visit.  Visit Diagnosis:  Nonhealing nonsurgical wound with necrosis of muscle          Wound Therapy - 12/19/14 1154    Subjective Pt states he is feeling better his restless legs have calmed down after he drank some tonic water.   Patient and Family Stated Goals wounds to heal    Prior Treatments podiatrists self care.   Pain Assessment No/denies pain   Pain Score 0-No pain   Wound Properties Date First Assessed: 11/14/14 Time First Assessed: 1000 Wound Type: Diabetic ulcer Location: Toe (Comment  which one) Location Orientation: Left , Great Toe  Wound Description (Comments): Plantar aspect of Lt great toe  , tunneling noted  Present on Admission: Yes   Dressing Type Gauze (Comment)  vaseline placed over callous with medihoney on woundbed    Dressing Changed Changed   Dressing Status Intact;Old drainage   Dressing Change Frequency --  twice a week   Site / Wound Assessment Clean;Other (Comment)   % Wound base Red or Granulating --  98   % Wound base Yellow --  3%   % Wound  base Other (Comment) --  callouses surrounding wound   Peri-wound Assessment Intact;Other (Comment)  calloused    Wound Length (cm) 0.8 cm   Wound Width (cm) 0.2 cm   Wound Depth (cm) 0.2 cm   Undermining (cm) minmal undermining    Margins Epibole (rolled edges)   Drainage Amount Scant   Drainage Description Serous   Treatment Cleansed;Debridement (Selective);Other (Comment)  dressing with medihoney and gauze    Wound Properties Date First Assessed: 03/14/14 Time First Assessed: 1930 Wound Type: Diabetic ulcer Location: Other (Comment) , r big toe  Location Orientation: Right Wound Description (Comments): nonhealing wound on planar aspct of MTP of great toe  Present on Admission: Yes   Dressing Type Gauze (Comment)  medihoney on wound; vaseline to  callous area   Dressing Changed Changed   Dressing Status Clean;Old drainage   Dressing Change Frequency --  twice a week   Site / Wound Assessment Red   % Wound base Red or Granulating 95%   % Wound base Yellow 5%   Peri-wound Assessment --  callouses surrounding wound   Wound Length (cm) 1.4 cm   Wound Width (cm) 1.3 cm   Wound Depth (cm) 0.2 cm   Drainage Amount Minimal   Drainage Description Serous   Treatment Cleansed;Debridement (Selective)   Selective Debridement - Location wound and callouse areas around wounds   Selective Debridement - Tools Used Forceps;Scissors   Selective Debridement - Tissue Removed callous; epiboled edges.    Wound Therapy - Clinical Statement Mainly debrided callous area on B LE.  Wounds are healing very slowly. Pt continues to state that his sugars ared under control.    Wound Therapy - Functional Problem List difficulty walking   Factors Delaying/Impairing Wound Healing Altered sensation;Diabetes Mellitus;Infection - systemic/local;Multiple medical problems;Vascular compromise   Hydrotherapy Plan Debridement;Dressing change;Patient/family education   Wound Therapy - Frequency --  2x week   Wound Therapy - Current Recommendations PT   Wound Plan Encourage pt to wear most current diabetic shoes, pt is weariong older ones and keeps his newer pair for "dressing up":    Dressing  medihoney f/b 2x2 ; kling and netting on Both LE   Decrease Necrotic Tissue to STG: 4 weeks: 0%   Decrease Necrotic Tissue - Progress Progressing toward goal   Increase Granulation Tissue to STG: 4 weeks 100%   Increase Granulation Tissue - Progress Progressing toward goal   Decrease Length/Width/Depth by (cm) STG: decrease Lt to have no tunnelling ; Rt to be decresed to 1.0x .75 x .1 STG for 4 weeks;  LTG healed 8 weeks   Decrease Length/Width/Depth - Progress Progressing toward goal   Patient/Family will be able to  I in HEP to strengthen DF by 1  grade 8 weeks     Patient/Family Instruction Goal - Progress Met   Additional Wound Therapy Goal Pt to state it is easier to walk 8 weeks    Additional Wound Therapy Goal - Progress Met   Wound Therapy - Potential for Goals Good            PT Education - 12/19/14 1202    Education provided Yes   Education Details heel toe gt as well as wearing newer shoes.    Person(s) Educated Patient   Methods Explanation   Comprehension Verbalized understanding             Problem List Patient Active Problem List   Diagnosis Date Noted  . Nonhealing skin ulcer 10/28/2014  .  PAD (peripheral artery disease) 10/28/2014  . Bilateral carotid artery disease 07/08/2014  . Critical lower limb ischemia 02/28/2014  . Obstructive sleep apnea 02/12/2014  . Aftercare following surgery of the circulatory system, [place] 12/04/2013  . Carotid stenosis 11/29/2013  . Restless leg syndrome 10/23/2013  . Sleep apnea 10/23/2013  . Obesity (BMI 30.0-34.9) 10/23/2013  . Hyperlipidemia 07/16/2013  . Peripheral arterial disease 07/16/2013  . Occlusion and stenosis of carotid artery without mention of cerebral infarction 04/24/2012  . History of stroke June 2013 03/14/2012  . Hypertension 03/14/2012  . Type 2 diabetes mellitus with circulatory disorder 03/14/2012  . Peripheral neuropathy 03/14/2012  . Cardiomyopathy- EF NL 5/14 03/14/2012  . ARTHRITIS, RIGHT FOOT 06/26/2008   Rayetta Humphrey, PT CLT [phone] 12/19/2014, 12:07 PM  [facility] [address] [place], [place], [address] Phone: [phone]   Fax:  [phone]

## 2014-12-25 ENCOUNTER — Ambulatory Visit (HOSPITAL_COMMUNITY): Payer: PPO | Attending: Podiatry | Admitting: Physical Therapy

## 2014-12-25 DIAGNOSIS — S91102D Unspecified open wound of left great toe without damage to nail, subsequent encounter: Secondary | ICD-10-CM | POA: Diagnosis not present

## 2014-12-25 DIAGNOSIS — E119 Type 2 diabetes mellitus without complications: Secondary | ICD-10-CM | POA: Diagnosis not present

## 2014-12-25 DIAGNOSIS — M6289 Other specified disorders of muscle: Secondary | ICD-10-CM

## 2014-12-25 DIAGNOSIS — I10 Essential (primary) hypertension: Secondary | ICD-10-CM | POA: Insufficient documentation

## 2014-12-25 DIAGNOSIS — T148XXA Other injury of unspecified body region, initial encounter: Secondary | ICD-10-CM

## 2014-12-25 NOTE — Therapy (Signed)
[place], [place], [address] Phone: [phone]   Fax:  [phone]  Wound Care Therapy  Patient Details  Name: [name] MRN: [id number] Date of Birth: [birth year] Referring Provider:  [name], MD  Encounter Date: 12/25/2014      PT End of Session - 12/25/14 1621    Visit Number 12   Number of Visits 16   Date for PT Re-Evaluation 01/13/15   Authorization Type medicare   Authorization - Visit Number 12   Authorization - Number of Visits 20   PT Start Time 0840   PT Stop Time 0932   PT Time Calculation (min) 52 min   Activity Tolerance Patient tolerated treatment well   Behavior During Therapy [facility] for tasks assessed/performed      Past Medical History  Diagnosis Date  . Hypertension   . Diabetes mellitus   . Carotid artery occlusion   . Stroke March 08, 2012  . Hyperlipidemia   . Peripheral arterial disease     nonhealing ulcers bilaterally on each great toe  . PONV (postoperative nausea and vomiting)   . Arthritis   . Hypothyroidism   . Shortness of breath   . Obstructive sleep apnea     Past Surgical History  Procedure Laterality Date  . Back surgery    . Cervical fusion    . Foot surgery    . Spine surgery    . Eye surgery    . Endarterectomy Left 11/29/2013    Procedure: ENDARTERECTOMY CAROTID;  Surgeon: Serafina Mitchell, MD;  Location: [facility] OR;  Service: Vascular;  Laterality: Left;  . Carotid endarterectomy Left   . Pv angiogram  02/18/2014    tibial vessel diseas bil.  . Angioplasty  02/28/14    diamond back orbital rotational atherectomy of Rt. tibial  . Lower ext duplex doppler  03/14/14    Rt ABI 1.2  . Lower extremity angiogram Bilateral 02/18/2014    Procedure: LOWER EXTREMITY ANGIOGRAM;  Surgeon: [name], MD;  Location: [facility] CATH LAB;  Service: Cardiovascular;  Laterality: Bilateral;  . Lower extremity angiogram N/A 10/31/2014    Procedure: LOWER EXTREMITY ANGIOGRAM;   Surgeon: [name], MD;  Location: [facility] CATH LAB;  Service: Cardiovascular;  Laterality: N/A;    There were no vitals filed for this visit.  Visit Diagnosis:  Nonhealing nonsurgical wound with necrosis of muscle      Subjective Assessment - 12/25/14 1609    Subjective Patient states no pain, doing well today   Currently in Pain? No/denies                   Wound Therapy - 12/25/14 1614    Subjective Patient doing well today, no pain but restless legs still going with L more active than R   Patient and Family Stated Goals wounds to heal    Prior Treatments podiatrists self care.   Wound Properties Date First Assessed: 11/14/14 Time First Assessed: 1000 Wound Type: Diabetic ulcer Location: Toe (Comment  which one) Location Orientation: Left , Great Toe  Wound Description (Comments): Plantar aspect of Lt great toe  , tunneling noted  Present on Admission: Yes   Dressing Type Gauze (Comment)  medihoney impregnated gauze    Dressing Status Intact;Old drainage   Site / Wound Assessment Clean;Dry   % Wound base Red or Granulating --  98%   % Wound base Yellow --  2%   %  Wound base Other (Comment) --  large calluses continue to surround wounds    Peri-wound Assessment Intact;Other (Comment)  large calluses    Margins Epibole (rolled edges)   Drainage Amount Scant   Drainage Description Serous   Treatment Cleansed;Debridement (Selective);Other (Comment)  medihoney impregnated gauze    Wound Properties Date First Assessed: 03/14/14 Time First Assessed: 1930 Wound Type: Diabetic ulcer Location: Other (Comment) , r big toe  Location Orientation: Right Wound Description (Comments): nonhealing wound on planar aspct of MTP of great toe  Present on Admission: Yes   Dressing Type Gauze (Comment)   Dressing Status Clean;Old drainage   Site / Wound Assessment Red   % Wound base Red or Granulating --  97%   % Wound base Yellow --  3%   Drainage Amount Minimal   Drainage  Description Serous   Treatment Cleansed;Debridement (Selective);Other (Comment)  medihoney impregnated gauze    Selective Debridement - Location wound and callouses around wounds    Selective Debridement - Tools Used Forceps;Scalpel;Scissors   Selective Debridement - Tissue Removed callous and epiboled edges   Wound Therapy - Clinical Statement Focus on addressing epibole especially on wound on R foot and on debreding callous; wounds are healing very slowly however patient states that his sugars are in good control. Educated that if his skin shows irritation from medical tape used to secure gauze, to remove and flush area with warm water and soap.     Wound Therapy - Functional Problem List difficulty walking   Factors Delaying/Impairing Wound Healing Altered sensation;Diabetes Mellitus;Infection - systemic/local;Multiple medical problems;Vascular compromise   Hydrotherapy Plan Debridement;Dressing change;Patient/family education   Wound Therapy - Current Recommendations PT                 PT Education - 12/25/14 1620    Education provided Yes   Education Details advised that if skin around edges of medical tape used to secure gauze appears irritated, to remove tape and flush area with warm water and soap   Person(s) Educated Patient   Methods Explanation   Comprehension Verbalized understanding                     Problem List Patient Active Problem List   Diagnosis Date Noted  . Nonhealing skin ulcer 10/28/2014  . PAD (peripheral artery disease) 10/28/2014  . Bilateral carotid artery disease 07/08/2014  . Critical lower limb ischemia 02/28/2014  . Obstructive sleep apnea 02/12/2014  . Aftercare following surgery of the circulatory system, [place] 12/04/2013  . Carotid stenosis 11/29/2013  . Restless leg syndrome 10/23/2013  . Sleep apnea 10/23/2013  . Obesity (BMI 30.0-34.9) 10/23/2013  . Hyperlipidemia 07/16/2013  . Peripheral arterial disease 07/16/2013   . Occlusion and stenosis of carotid artery without mention of cerebral infarction 04/24/2012  . History of stroke June 2013 03/14/2012  . Hypertension 03/14/2012  . Type 2 diabetes mellitus with circulatory disorder 03/14/2012  . Peripheral neuropathy 03/14/2012  . Cardiomyopathy- EF NL 5/14 03/14/2012  . ARTHRITIS, RIGHT FOOT 06/26/2008    Deniece Ree PT, DPT [facility] [address] [name], [place], [address] Phone: [phone]   Fax:  [phone]

## 2014-12-27 ENCOUNTER — Ambulatory Visit (HOSPITAL_COMMUNITY): Payer: PPO

## 2014-12-27 DIAGNOSIS — M6289 Other specified disorders of muscle: Secondary | ICD-10-CM

## 2014-12-27 DIAGNOSIS — T148XXA Other injury of unspecified body region, initial encounter: Secondary | ICD-10-CM

## 2014-12-27 DIAGNOSIS — S91102D Unspecified open wound of left great toe without damage to nail, subsequent encounter: Secondary | ICD-10-CM | POA: Diagnosis not present

## 2014-12-27 NOTE — Therapy (Signed)
[place], [place], [address] Phone: [phone]   Fax:  [phone]  Wound Care Therapy  Patient Details  Name: [name] MRN: [id number] Date of Birth: April 19, 1946 Referring Provider:  [name], MD  Encounter Date: 12/27/2014      PT End of Session - 12/27/14 1839    Visit Number 13   Number of Visits 16   Date for PT Re-Evaluation 01/13/15   Authorization Type medicare   Authorization - Visit Number 13   Authorization - Number of Visits 20   PT Start Time 2778   PT Stop Time 2423   PT Time Calculation (min) 48 min   Activity Tolerance Patient tolerated treatment well   Behavior During Therapy [facility] for tasks assessed/performed      Past Medical History  Diagnosis Date  . Hypertension   . Diabetes mellitus   . Carotid artery occlusion   . Stroke March 08, 2012  . Hyperlipidemia   . Peripheral arterial disease     nonhealing ulcers bilaterally on each great toe  . PONV (postoperative nausea and vomiting)   . Arthritis   . Hypothyroidism   . Shortness of breath   . Obstructive sleep apnea     Past Surgical History  Procedure Laterality Date  . Back surgery    . Cervical fusion    . Foot surgery    . Spine surgery    . Eye surgery    . Endarterectomy Left 11/29/2013    Procedure: ENDARTERECTOMY CAROTID;  Surgeon: Serafina Mitchell, MD;  Location: [facility] OR;  Service: Vascular;  Laterality: Left;  . Carotid endarterectomy Left   . Pv angiogram  02/18/2014    tibial vessel diseas bil.  . Angioplasty  02/28/14    diamond back orbital rotational atherectomy of Rt. tibial  . Lower ext duplex doppler  03/14/14    Rt ABI 1.2  . Lower extremity angiogram Bilateral 02/18/2014    Procedure: LOWER EXTREMITY ANGIOGRAM;  Surgeon: [name], MD;  Location: [facility] CATH LAB;  Service: Cardiovascular;  Laterality: Bilateral;  . Lower extremity angiogram N/A 10/31/2014    Procedure: LOWER EXTREMITY ANGIOGRAM;   Surgeon: [name], MD;  Location: [facility] CATH LAB;  Service: Cardiovascular;  Laterality: N/A;    There were no vitals filed for this visit.  Visit Diagnosis:  Nonhealing nonsurgical wound with necrosis of muscle      Subjective Assessment - 12/27/14 1544    Subjective Patient states no pain, doing well today   Currently in Pain? No/denies            Wound Therapy - 12/27/14 1831    Subjective Patient states no pain, doing well today   Patient and Family Stated Goals wounds to heal    Prior Treatments podiatrists self care.   Pain Assessment No/denies pain   Pain Score 0-No pain   Wound Properties Date First Assessed: 11/14/14 Time First Assessed: 1000 Wound Type: Diabetic ulcer Location: Toe (Comment  which one) Location Orientation: Left , Great Toe  Wound Description (Comments): Plantar aspect of Lt great toe  , tunneling noted  Present on Admission: Yes   Dressing Type Gauze (Comment)  Medihoney, gauze and medipore tape   Dressing Status Intact;Old drainage   Site / Wound Assessment Clean;Dry   % Wound base Red or Granulating 95%   % Wound base Yellow 10%   % Wound base Other (Comment) --  callouses  surrouning wound   Peri-wound Assessment Intact;Other (Comment)   Margins Epibole (rolled edges)   Drainage Amount Scant   Drainage Description Serous   Wound Properties Date First Assessed: 03/14/14 Time First Assessed: 1930 Wound Type: Diabetic ulcer Location: Other (Comment) , r big toe  Location Orientation: Right Wound Description (Comments): nonhealing wound on planar aspct of MTP of great toe  Present on Admission: Yes   Dressing Type Gauze (Comment)   Dressing Status Clean;Old drainage   Site / Wound Assessment Red   Drainage Description Serous   Treatment Cleansed;Debridement (Selective)   Selective Debridement - Location wound and callouses around wounds    Selective Debridement - Tools Used Scalpel;Forceps   Selective Debridement - Tissue Removed callous  and epiboled edges   Wound Therapy - Clinical Statement No noted skin irritatino with change or dressing to medipore and no increased edema.  Dressings were intact upon arrival.  Cotninued debridment of callouses surrounding wound and removal of slough to promorte healing.  Utilized medihoney, gauze and medipore tape as dressings.  No reports of pain through session.      Wound Therapy - Functional Problem List difficulty walking   Factors Delaying/Impairing Wound Healing Altered sensation;Diabetes Mellitus;Infection - systemic/local;Multiple medical problems;Vascular compromise   Hydrotherapy Plan Debridement;Dressing change;Patient/family education   Wound Therapy - Current Recommendations PT   Wound Plan Encourage pt to wear most current diabetic shoes, pt is weariong older ones and keeps his newer pair for "dressing up":    Dressing  medihoney, 2c2, medipore taping       Problem List Patient Active Problem List   Diagnosis Date Noted  . Nonhealing skin ulcer 10/28/2014  . PAD (peripheral artery disease) 10/28/2014  . Bilateral carotid artery disease 07/08/2014  . Critical lower limb ischemia 02/28/2014  . Obstructive sleep apnea 02/12/2014  . Aftercare following surgery of the circulatory system, [place] 12/04/2013  . Carotid stenosis 11/29/2013  . Restless leg syndrome 10/23/2013  . Sleep apnea 10/23/2013  . Obesity (BMI 30.0-34.9) 10/23/2013  . Hyperlipidemia 07/16/2013  . Peripheral arterial disease 07/16/2013  . Occlusion and stenosis of carotid artery without mention of cerebral infarction 04/24/2012  . History of stroke June 2013 03/14/2012  . Hypertension 03/14/2012  . Type 2 diabetes mellitus with circulatory disorder 03/14/2012  . Peripheral neuropathy 03/14/2012  . Cardiomyopathy- EF NL 5/14 03/14/2012  . ARTHRITIS, RIGHT FOOT 06/26/2008   [name], [place]; [place] [id number] [phone]  [name] 12/27/2014, [place] [address] [place], [place], [address] Phone: [phone]   Fax:  [phone]

## 2014-12-30 ENCOUNTER — Encounter: Payer: Self-pay | Admitting: *Deleted

## 2014-12-31 ENCOUNTER — Ambulatory Visit (HOSPITAL_COMMUNITY): Payer: PPO | Admitting: Physical Therapy

## 2014-12-31 DIAGNOSIS — M6289 Other specified disorders of muscle: Secondary | ICD-10-CM

## 2014-12-31 DIAGNOSIS — S91102D Unspecified open wound of left great toe without damage to nail, subsequent encounter: Secondary | ICD-10-CM | POA: Diagnosis not present

## 2014-12-31 DIAGNOSIS — T148XXA Other injury of unspecified body region, initial encounter: Secondary | ICD-10-CM

## 2014-12-31 NOTE — Therapy (Signed)
[place], [place], [address] Phone: [phone]   Fax:  [phone]  Wound Care Therapy  Patient Details  Name: [name] MRN: [id number] Date of Birth: May 15, 1946 Referring Provider:  Gean Birchwood, DPM  Encounter Date: 12/31/2014      PT End of Session - 12/31/14 1540    Visit Number 14   Number of Visits 16   Date for PT Re-Evaluation 01/13/15   Authorization Type medicare   Authorization - Visit Number 14   Authorization - Number of Visits 20   PT Start Time 9518   PT Stop Time 1240   PT Time Calculation (min) 45 min      Past Medical History  Diagnosis Date  . Hypertension   . Diabetes mellitus   . Carotid artery occlusion   . Stroke March 08, 2012  . Hyperlipidemia   . Peripheral arterial disease     nonhealing ulcers bilaterally on each great toe  . PONV (postoperative nausea and vomiting)   . Arthritis   . Hypothyroidism   . Shortness of breath   . Obstructive sleep apnea     Past Surgical History  Procedure Laterality Date  . Back surgery    . Cervical fusion    . Foot surgery    . Spine surgery    . Eye surgery    . Endarterectomy Left 11/29/2013    Procedure: ENDARTERECTOMY CAROTID;  Surgeon: Serafina Mitchell, MD;  Location: [facility] OR;  Service: Vascular;  Laterality: Left;  . Carotid endarterectomy Left   . Pv angiogram  02/18/2014    tibial vessel diseas bil.  . Angioplasty  02/28/14    diamond back orbital rotational atherectomy of Rt. tibial  . Lower ext duplex doppler  03/14/14    Rt ABI 1.2  . Lower extremity angiogram Bilateral 02/18/2014    Procedure: LOWER EXTREMITY ANGIOGRAM;  Surgeon: [name], MD;  Location: [facility] CATH LAB;  Service: Cardiovascular;  Laterality: Bilateral;  . Lower extremity angiogram N/A 10/31/2014    Procedure: LOWER EXTREMITY ANGIOGRAM;  Surgeon: [name], MD;  Location: [facility] CATH LAB;  Service: Cardiovascular;  Laterality: N/A;     There were no vitals filed for this visit.  Visit Diagnosis:  No diagnosis found.                 Wound Therapy - 12/31/14 1525    Subjective Pt states that his right foot has been bothering him he is not sure if it is due to the wound or due to his neuropathy.  Pt satees that his dressings fell off the same day they were put on.    Patient and Family Stated Goals wounds to heal    Prior Treatments podiatrists self care.   Pain Assessment 0-10   Pain Score 3    Wound Properties Date First Assessed: 11/14/14 Time First Assessed: 1000 Wound Type: Diabetic ulcer Location: Toe (Comment  which one) Location Orientation: Left , Great Toe  Wound Description (Comments): Plantar aspect of Lt great toe  , tunneling noted  Present on Admission: Yes   Dressing Type Gauze (Comment)  vaseline, 2x2 and 2" kling    Dressing Status Intact;Old drainage   Site / Wound Assessment Clean;Dry   % Wound base Red or Granulating --  98%   % Wound base Yellow --  2%   % Wound base Other (Comment) --  callouses surrouning wound  Peri-wound Assessment Intact;Other (Comment)  callous around wound    Margins Epibole (rolled edges)   Drainage Amount Scant   Drainage Description Serous   Treatment Cleansed;Debridement (Selective)   Wound Properties Date First Assessed: 03/14/14 Time First Assessed: 1930 Wound Type: Diabetic ulcer Location: Other (Comment) , r big toe  Location Orientation: Right Wound Description (Comments): nonhealing wound on planar aspct of MTP of great toe  Present on Admission: Yes   Dressing Type --  medihoney; 4x4 secured  with 3 " kling    Dressing Changed Changed   Dressing Status Clean;Old drainage   Site / Wound Assessment Red   % Wound base Red or Granulating 95%   % Wound base Yellow 5%   Drainage Description Serous   Treatment Cleansed;Debridement (Selective)   Selective Debridement - Location wound and callouses around wounds    Selective Debridement - Tools  Used Forceps;Scalpel;Scissors   Selective Debridement - Tissue Removed callous and epiboled edges   Wound Therapy - Clinical Statement Significant amount of callous able to be debrided especially around wound on Rt MTP.  Note this actually may increase wound size as wound was undermining    Wound Therapy - Functional Problem List difficulty walking   Factors Delaying/Impairing Wound Healing Altered sensation;Diabetes Mellitus;Infection - systemic/local;Multiple medical problems;Vascular compromise   Hydrotherapy Plan Debridement;Dressing change  wounds to be measured next treatment    Wound Therapy - Current Recommendations PT   Wound Plan Encourage pt to wear most current diabetic shoes, PT hammer toes on his Lt foot are red on dorsal aspect where toes have been rubbing on his shoes.  The pt was told to speak to his podiatrist concerning the irritation   Dressing  medihoney,4x4 and 3" kling for Rt; vaseline  2x2 and 2 " kling on LT great toe.     Decrease Necrotic Tissue to STG: 4 weeks: 0%   Decrease Necrotic Tissue - Progress Progressing toward goal   Increase Granulation Tissue to STG: 4 weeks 100%   Increase Granulation Tissue - Progress Progressing toward goal   Decrease Length/Width/Depth by (cm) STG: decrease Lt to have no tunnelling ; Rt to be decresed to 1.0x .75 x .1 STG for 4 weeks;  LTG healed 8 weeks   Decrease Length/Width/Depth - Progress Progressing toward goal             PT Education - 12/31/14 1539    Education provided Yes   Education Details Therapist expressed concerned about hammer toes on Lt LE as the IP joints are red where they have been rubbing on his shoe.  Advised to speak to podiatrist about this .   Person(s) Educated Patient   Methods Explanation   Comprehension Verbalized understanding            Problem List Patient Active Problem List   Diagnosis Date Noted  . Nonhealing skin ulcer 10/28/2014  . PAD (peripheral artery disease) 10/28/2014   . Bilateral carotid artery disease 07/08/2014  . Critical lower limb ischemia 02/28/2014  . Obstructive sleep apnea 02/12/2014  . Aftercare following surgery of the circulatory system, [place] 12/04/2013  . Carotid stenosis 11/29/2013  . Restless leg syndrome 10/23/2013  . Sleep apnea 10/23/2013  . Obesity (BMI 30.0-34.9) 10/23/2013  . Hyperlipidemia 07/16/2013  . Peripheral arterial disease 07/16/2013  . Occlusion and stenosis of carotid artery without mention of cerebral infarction 04/24/2012  . History of stroke June 2013 03/14/2012  . Hypertension 03/14/2012  . Type 2 diabetes mellitus with  circulatory disorder 03/14/2012  . Peripheral neuropathy 03/14/2012  . Cardiomyopathy- EF NL 5/14 03/14/2012  . ARTHRITIS, RIGHT FOOT 06/26/2008  Rayetta Humphrey, PT CLT [phone]  12/31/2014, 3:41 PM  [place] [facility] [address] [place], [place], [address] Phone: [phone]   Fax:  [phone]

## 2015-01-02 ENCOUNTER — Ambulatory Visit (HOSPITAL_COMMUNITY): Payer: PPO | Admitting: Physical Therapy

## 2015-01-02 DIAGNOSIS — M6289 Other specified disorders of muscle: Secondary | ICD-10-CM

## 2015-01-02 DIAGNOSIS — S91102D Unspecified open wound of left great toe without damage to nail, subsequent encounter: Secondary | ICD-10-CM | POA: Diagnosis not present

## 2015-01-02 DIAGNOSIS — T148XXA Other injury of unspecified body region, initial encounter: Secondary | ICD-10-CM

## 2015-01-02 NOTE — Therapy (Signed)
[name], [place], [address] Phone: [phone]   Fax:  [phone]  Wound Care Therapy  Patient Details  Name: [name] MRN: [id number] Date of Birth: 1946/05/22 Referring Provider:  Gean Birchwood, DPM  Encounter Date: 01/02/2015      PT End of Session - 01/02/15 1312    Visit Number 15   Number of Visits 23   Date for PT Re-Evaluation 01/13/15   Authorization Type medicare   Authorization - Visit Number 15   Authorization - Number of Visits 20   PT Start Time 2992   PT Stop Time 0935   PT Time Calculation (min) 45 min   Activity Tolerance Patient tolerated treatment well      Past Medical History  Diagnosis Date  . Hypertension   . Diabetes mellitus   . Carotid artery occlusion   . Stroke March 08, 2012  . Hyperlipidemia   . Peripheral arterial disease     nonhealing ulcers bilaterally on each great toe  . PONV (postoperative nausea and vomiting)   . Arthritis   . Hypothyroidism   . Shortness of breath   . Obstructive sleep apnea     Past Surgical History  Procedure Laterality Date  . Back surgery    . Cervical fusion    . Foot surgery    . Spine surgery    . Eye surgery    . Endarterectomy Left 11/29/2013    Procedure: ENDARTERECTOMY CAROTID;  Surgeon: Serafina Mitchell, MD;  Location: [facility] OR;  Service: Vascular;  Laterality: Left;  . Carotid endarterectomy Left   . Pv angiogram  02/18/2014    tibial vessel diseas bil.  . Angioplasty  02/28/14    diamond back orbital rotational atherectomy of Rt. tibial  . Lower ext duplex doppler  03/14/14    Rt ABI 1.2  . Lower extremity angiogram Bilateral 02/18/2014    Procedure: LOWER EXTREMITY ANGIOGRAM;  Surgeon: [name], MD;  Location: [facility] CATH LAB;  Service: Cardiovascular;  Laterality: Bilateral;  . Lower extremity angiogram N/A 10/31/2014    Procedure: LOWER EXTREMITY ANGIOGRAM;  Surgeon: [name], MD;  Location: [facility] CATH  LAB;  Service: Cardiovascular;  Laterality: N/A;    There were no vitals filed for this visit.  Visit Diagnosis:  Nonhealing nonsurgical wound with necrosis of muscle                 Wound Therapy - 01/02/15 1302    Subjective Pt states that he has been keeping his feet up.  He has an appointment this afternoon with his MD due to not feeling well for the past several weeks.   Patient and Family Stated Goals wounds to heal    Prior Treatments podiatrists self care.   Pain Assessment No/denies pain   Pain Score 0-No pain   Wound Properties Date First Assessed: 11/14/14 Time First Assessed: 1000 Wound Type: Diabetic ulcer Location: Toe (Comment  which one) Location Orientation: Left , Great Toe  Wound Description (Comments): Plantar aspect of Lt great toe  , tunneling noted  Present on Admission: Yes   Dressing Type Gauze (Comment)  vaseline, 2x2 and 2" kling    Dressing Status Intact;Old drainage   Site / Wound Assessment Clean;Dry   % Wound base Red or Granulating --  99 ws 60 %   % Wound base Yellow --  1 was 40% slough    % Wound base  Other (Comment) --  callouses surrouning wound   Peri-wound Assessment Intact;Other (Comment)  callous around wound  Initially had tunneling .7 anteriorly; 1.2 posteriorly and .2 medially now no tunneling   Wound Length (cm) 0.7 cm  was 1.2 cm    Wound Width (cm) 0.1 cm  Was  .3 cm   Wound Depth (cm) 0.1 cm was .3 cm   Margins Epibole (rolled edges)   Drainage Amount Scant   Drainage Description Serous   Treatment Cleansed;Debridement (Selective)   Wound Properties Date First Assessed: 03/14/14 Time First Assessed: 1930 Wound Type: Diabetic ulcer Location: Other (Comment) , r big toe  Location Orientation: Right Wound Description (Comments): nonhealing wound on planar aspct of MTP of great toe  Present on Admission: Yes   Dressing Type Gauze (Comment)  medihoney; 4x4 secured  with 3 " kling    Dressing Changed Changed   Dressing Status  Clean;Old drainage   Site / Wound Assessment Red   % Wound base Red or Granulating --  98% was 60 %   % Wound base Yellow --  2% was 40% slough    Peri-wound Assessment Other (Comment)  Calloused    Wound Length (cm) 1.1 cm was 1.8 cm   Wound Width (cm) 1.3 cm was 1.6 cm   Wound Depth (cm) 0.1 cm was .3 cm   Margins Epibole (rolled edges)   Drainage Description Serous   Treatment Cleansed;Debridement (Selective)   Selective Debridement - Location callous around wound   Selective Debridement - Tools Used Forceps;Scalpel;Scissors   Selective Debridement - Tissue Removed callous and epiboled edges   Wound Therapy - Clinical Statement Wounds are slowly closing with callous area becoming more supple making debridement of callous easier.    Wound Therapy - Functional Problem List difficulty walking   Factors Delaying/Impairing Wound Healing Altered sensation;Diabetes Mellitus;Infection - systemic/local;Multiple medical problems;Vascular compromise   Hydrotherapy Plan Debridement;Dressing change  wounds to be measured next treatment    Wound Therapy - Current Recommendations PT   Wound Plan Debridement of callous area.  Encourage pt to were current diabetic shoe and to stay off of wounds as much as possible.   Dressing  medihoney,4x4 and 3" kling for Rt; vaseline  2x2 and 2 " kling on LT great toe.     Decrease Necrotic Tissue to STG: 4 weeks: 0%   Decrease Necrotic Tissue - Progress Progressing toward goal   Increase Granulation Tissue to STG: 4 weeks 100%   Increase Granulation Tissue - Progress Progressing toward goal   Decrease Length/Width/Depth by (cm) STG: decrease Lt to have no tunnelling ; Rt to be decresed to 1.0x .75 x .1 STG for 4 weeks;  LTG healed 8 weeks   Decrease Length/Width/Depth - Progress Progressing toward goal   Wound Therapy - Potential for Goals Good      Wounds are improving with significant decreased callous.  Due to pt co-morbidities healing is slow.  Recommend  continuing 2 more weeks for the next 4 weeks.            PT Education - 01/02/15 1311    Education provided Yes   Education Details encouraged to use new diabetic shoes as opposed to his old pair which is what pt normally wears.    Person(s) Educated Patient   Methods Explanation   Comprehension Verbalized understanding                     Problem List Patient Active Problem  List   Diagnosis Date Noted  . Nonhealing skin ulcer 10/28/2014  . PAD (peripheral artery disease) 10/28/2014  . Bilateral carotid artery disease 07/08/2014  . Critical lower limb ischemia 02/28/2014  . Obstructive sleep apnea 02/12/2014  . Aftercare following surgery of the circulatory system, [place] 12/04/2013  . Carotid stenosis 11/29/2013  . Restless leg syndrome 10/23/2013  . Sleep apnea 10/23/2013  . Obesity (BMI 30.0-34.9) 10/23/2013  . Hyperlipidemia 07/16/2013  . Peripheral arterial disease 07/16/2013  . Occlusion and stenosis of carotid artery without mention of cerebral infarction 04/24/2012  . History of stroke June 2013 03/14/2012  . Hypertension 03/14/2012  . Type 2 diabetes mellitus with circulatory disorder 03/14/2012  . Peripheral neuropathy 03/14/2012  . Cardiomyopathy- EF NL 5/14 03/14/2012  . ARTHRITIS, RIGHT FOOT 06/26/2008  Rayetta Humphrey, PT CLT [phone] 01/02/2015, 1:14 PM  [place] [address] [place], [place], [address] Phone: [phone]   Fax:  [phone]

## 2015-01-06 ENCOUNTER — Ambulatory Visit (HOSPITAL_COMMUNITY): Payer: PPO | Admitting: Physical Therapy

## 2015-01-06 DIAGNOSIS — T148XXA Other injury of unspecified body region, initial encounter: Secondary | ICD-10-CM

## 2015-01-06 DIAGNOSIS — S91102D Unspecified open wound of left great toe without damage to nail, subsequent encounter: Secondary | ICD-10-CM | POA: Diagnosis not present

## 2015-01-06 DIAGNOSIS — M6289 Other specified disorders of muscle: Secondary | ICD-10-CM

## 2015-01-06 NOTE — Therapy (Signed)
[place], [place], [address] Phone: [phone]   Fax:  [phone]  Wound Care Therapy  Patient Details  Name: [name] MRN: [id number] Date of Birth: September 01, 1946 Referring Provider:  [name], MD  Encounter Date: 01/06/2015      PT End of Session - 01/06/15 1205    Visit Number 16   Number of Visits 23   Date for PT Re-Evaluation 01/13/15   Authorization Type medicare   Authorization - Visit Number 16   Authorization - Number of Visits 20   PT Start Time 0758   PT Stop Time 0841   PT Time Calculation (min) 43 min   Activity Tolerance Patient tolerated treatment well   Behavior During Therapy [facility] for tasks assessed/performed      Past Medical History  Diagnosis Date  . Hypertension   . Diabetes mellitus   . Carotid artery occlusion   . Stroke March 08, 2012  . Hyperlipidemia   . Peripheral arterial disease     nonhealing ulcers bilaterally on each great toe  . PONV (postoperative nausea and vomiting)   . Arthritis   . Hypothyroidism   . Shortness of breath   . Obstructive sleep apnea     Past Surgical History  Procedure Laterality Date  . Back surgery    . Cervical fusion    . Foot surgery    . Spine surgery    . Eye surgery    . Endarterectomy Left 11/29/2013    Procedure: ENDARTERECTOMY CAROTID;  Surgeon: Serafina Mitchell, MD;  Location: [facility] OR;  Service: Vascular;  Laterality: Left;  . Carotid endarterectomy Left   . Pv angiogram  02/18/2014    tibial vessel diseas bil.  . Angioplasty  02/28/14    diamond back orbital rotational atherectomy of Rt. tibial  . Lower ext duplex doppler  03/14/14    Rt ABI 1.2  . Lower extremity angiogram Bilateral 02/18/2014    Procedure: LOWER EXTREMITY ANGIOGRAM;  Surgeon: [name], MD;  Location: [facility] CATH LAB;  Service: Cardiovascular;  Laterality: Bilateral;  . Lower extremity angiogram N/A 10/31/2014    Procedure: LOWER EXTREMITY  ANGIOGRAM;  Surgeon: [name], MD;  Location: [facility] CATH LAB;  Service: Cardiovascular;  Laterality: N/A;    There were no vitals filed for this visit.  Visit Diagnosis:  Nonhealing nonsurgical wound with necrosis of muscle      Subjective Assessment - 01/06/15 1154    Subjective Patient states he is doing well, no pain but very tired today as he did not sleep well this weekend                   Wound Therapy - 01/06/15 1157    Subjective Patient states he is doing well today, no pain but he is very tired as he did not sleep well this weekend    Patient and Family Stated Goals wounds to heal    Prior Treatments podiatrists self care.   Pain Assessment No/denies pain   Pain Score 0-No pain   Wound Properties Date First Assessed: 11/14/14 Time First Assessed: 1000 Wound Type: Diabetic ulcer Location: Toe (Comment  which one) Location Orientation: Left , Great Toe  Wound Description (Comments): Plantar aspect of Lt great toe  , tunneling noted  Present on Admission: Yes   Dressing Type Gauze (Comment)  vasoline and small  amount medihoney   Dressing Status Intact;Old drainage  Site / Wound Assessment Clean;Dry   % Wound base Red or Granulating 100%   % Wound base Yellow 0%   Peri-wound Assessment Intact;Other (Comment)  callous    Margins Epibole (rolled edges)   Drainage Amount Scant   Drainage Description Serous   Treatment Cleansed;Debridement (Selective)   Wound Properties Date First Assessed: 03/14/14 Time First Assessed: 1930 Wound Type: Diabetic ulcer Location: Other (Comment) , r big toe  Location Orientation: Right Wound Description (Comments): nonhealing wound on planar aspct of MTP of great toe  Present on Admission: Yes   Dressing Type Gauze (Comment);Other (Comment)  medipore tape    Dressing Changed Changed   Dressing Status Clean;Old drainage   Site / Wound Assessment Red   % Wound base Red or Granulating --  99%   % Wound base Yellow --  1%    Peri-wound Assessment Other (Comment)  callous    Margins Epibole (rolled edges)   Drainage Description Serous   Treatment Cleansed;Debridement (Selective);Other (Comment)  medihoney impregnated gauze    Selective Debridement - Location callous around wound   Selective Debridement - Tools Used Forceps;Scalpel;Scissors   Selective Debridement - Tissue Removed callous and epiboled edges   Wound Therapy - Clinical Statement Continued wound treatment with focus on reducing epibollied edges and shaving callous today; some blood flow present as epibolllied edges removed. Continues to have slow healing especially on R foot. Instructed that if he notices any skin irriatation or color changes around medipore tape to remove and flush area with soap and water.     Wound Therapy - Functional Problem List difficulty walking   Factors Delaying/Impairing Wound Healing Altered sensation;Diabetes Mellitus;Infection - systemic/local;Multiple medical problems;Vascular compromise   Hydrotherapy Plan Debridement;Dressing change   Wound Therapy - Current Recommendations PT   Wound Plan Debridement of callous area.  Encourage pt to were current diabetic shoe and to stay off of wounds as much as possible.   Dressing  Medihoney impregnated gauze and vasoline on R; medihoney and vasoline on L. Dressed with medipore tape and gauze.                  PT Education - 01/06/15 1205    Education provided No                     Problem List Patient Active Problem List   Diagnosis Date Noted  . Nonhealing skin ulcer 10/28/2014  . PAD (peripheral artery disease) 10/28/2014  . Bilateral carotid artery disease 07/08/2014  . Critical lower limb ischemia 02/28/2014  . Obstructive sleep apnea 02/12/2014  . Aftercare following surgery of the circulatory system, [place] 12/04/2013  . Carotid stenosis 11/29/2013  . Restless leg syndrome 10/23/2013  . Sleep apnea 10/23/2013  . Obesity (BMI 30.0-34.9)  10/23/2013  . Hyperlipidemia 07/16/2013  . Peripheral arterial disease 07/16/2013  . Occlusion and stenosis of carotid artery without mention of cerebral infarction 04/24/2012  . History of stroke June 2013 03/14/2012  . Hypertension 03/14/2012  . Type 2 diabetes mellitus with circulatory disorder 03/14/2012  . Peripheral neuropathy 03/14/2012  . Cardiomyopathy- EF NL 5/14 03/14/2012  . ARTHRITIS, RIGHT FOOT 06/26/2008    Deniece Ree PT, DPT [place] [address] [place], [place], [address] Phone: [phone]   Fax:  [phone]

## 2015-01-08 ENCOUNTER — Ambulatory Visit (HOSPITAL_COMMUNITY): Payer: PPO

## 2015-01-08 DIAGNOSIS — S91102D Unspecified open wound of left great toe without damage to nail, subsequent encounter: Secondary | ICD-10-CM | POA: Diagnosis not present

## 2015-01-08 DIAGNOSIS — M6289 Other specified disorders of muscle: Secondary | ICD-10-CM

## 2015-01-08 DIAGNOSIS — T148XXA Other injury of unspecified body region, initial encounter: Secondary | ICD-10-CM

## 2015-01-08 NOTE — Therapy (Signed)
[place], [place], [address] Phone: [phone]   Fax:  [phone]  Physical Therapy Treatment  Patient Details  Name: [name] MRN: [id number] Date of Birth: [birth year] Referring Provider:  [name], MD  Encounter Date: 01/08/2015      PT End of Session - 01/08/15 1134    Visit Number 17   Number of Visits 23   Date for PT Re-Evaluation 01/13/15   Authorization Type medicare   Authorization - Visit Number 17   Authorization - Number of Visits 20   PT Start Time 0850   PT Stop Time 0928   PT Time Calculation (min) 38 min   Activity Tolerance Patient tolerated treatment well   Behavior During Therapy [facility] for tasks assessed/performed      Past Medical History  Diagnosis Date  . Hypertension   . Diabetes mellitus   . Carotid artery occlusion   . Stroke March 08, 2012  . Hyperlipidemia   . Peripheral arterial disease     nonhealing ulcers bilaterally on each great toe  . PONV (postoperative nausea and vomiting)   . Arthritis   . Hypothyroidism   . Shortness of breath   . Obstructive sleep apnea     Past Surgical History  Procedure Laterality Date  . Back surgery    . Cervical fusion    . Foot surgery    . Spine surgery    . Eye surgery    . Endarterectomy Left 11/29/2013    Procedure: ENDARTERECTOMY CAROTID;  Surgeon: Serafina Mitchell, MD;  Location: [facility] OR;  Service: Vascular;  Laterality: Left;  . Carotid endarterectomy Left   . Pv angiogram  02/18/2014    tibial vessel diseas bil.  . Angioplasty  02/28/14    diamond back orbital rotational atherectomy of Rt. tibial  . Lower ext duplex doppler  03/14/14    Rt ABI 1.2  . Lower extremity angiogram Bilateral 02/18/2014    Procedure: LOWER EXTREMITY ANGIOGRAM;  Surgeon: [name], MD;  Location: [facility] CATH LAB;  Service: Cardiovascular;  Laterality: Bilateral;  . Lower extremity angiogram N/A 10/31/2014    Procedure: LOWER EXTREMITY  ANGIOGRAM;  Surgeon: [name], MD;  Location: [facility] CATH LAB;  Service: Cardiovascular;  Laterality: N/A;    There were no vitals filed for this visit.  Visit Diagnosis:  Nonhealing nonsurgical wound with necrosis of muscle      Subjective Assessment - 01/08/15 0933    Subjective Pt stated he is doing well, no pain today.  Has been wearing his boot and relaxing with legs proped up               Wound Therapy - 01/08/15 0934    Subjective Pt stated he is doing well, no pain today.  Has been wearing his boot and relaxing with legs proped up   Patient and Family Stated Goals wounds to heal    Prior Treatments podiatrists self care.   Pain Assessment No/denies pain   Pain Score 0-No pain   Wound Properties Date First Assessed: 11/14/14 Time First Assessed: 1000 Wound Type: Diabetic ulcer Location: Toe (Comment  which one) Location Orientation: Left , Great Toe  Wound Description (Comments): Plantar aspect of Lt great toe  , tunneling noted  Present on Admission: Yes   Dressing Type Gauze (Comment)  vaseline, 2x2 and medipore tape   Dressing Status Intact;Old drainage   Site / Wound Assessment Clean;Dry   %  Wound base Red or Granulating 100%   % Wound base Yellow 0%   % Wound base Other (Comment) --  minimal callous   Peri-wound Assessment Intact;Other (Comment)  minimal callous   Margins Attached edges (approximated)   Drainage Amount None   Drainage Description --   Treatment Cleansed;Debridement (Selective)   Wound Properties Date First Assessed: 03/14/14 Time First Assessed: 1930 Wound Type: Diabetic ulcer Location: Other (Comment) , r big toe  Location Orientation: Right Wound Description (Comments): nonhealing wound on planar aspct of MTP of great toe  Present on Admission: Yes   Dressing Type Hydrogel;Gauze (Comment)  vaseline, hydrogel, 4x 4 and medipore tape and netting   Dressing Changed Changed   Dressing Status Clean;Dry   Site / Wound Assessment Granulation  tissue   % Wound base Red or Granulating 100%   % Wound base Yellow 0%   % Wound base Other (Comment) --  callous   Peri-wound Assessment --  callous   Margins Epibole (rolled edges)   Drainage Amount Scant   Drainage Description Serous   Treatment Cleansed;Debridement (Selective)   Selective Debridement - Location callous around wound   Selective Debridement - Tools Used Scalpel   Selective Debridement - Tissue Removed callous and epiboled edges   Wound Therapy - Clinical Statement Lt great toe fully healed following removal of callous with minimal remaining.  Rt metatarsal region removal of vast amount of callous surrounding wound.  Continued with vaselinex 4x4 and medipore tape over both wounds.  Changed dressing to hydrogel on Rt foot to promote healing with vast amount of vaseline surrounding wound to help soften callous.  No reports of pain through session.     Wound Therapy - Functional Problem List difficulty walking   Factors Delaying/Impairing Wound Healing Altered sensation;Diabetes Mellitus;Infection - systemic/local;Multiple medical problems;Vascular compromise   Hydrotherapy Plan Debridement;Dressing change   Wound Therapy - Current Recommendations PT   Wound Plan Debridement of callous area.  Encourage pt to were current diabetic shoe and to stay off of wounds as much as possible.   Dressing  Rt foot hydrogel, vaeline surrounding wound, 4x4, medipore and netting.  Continued with vaseline and medipore tape on Lt toe to keep intact   Decrease Necrotic Tissue to STG: 4 weeks: 0%   Decrease Necrotic Tissue - Progress Progressing toward goal   Increase Granulation Tissue to STG: 4 weeks 100%   Increase Granulation Tissue - Progress Progressing toward goal   Decrease Length/Width/Depth by (cm) STG: decrease Lt to have no tunnelling ; Rt to be decresed to 1.0x .75 x .1 STG for 4 weeks;  LTG healed 8 weeks   Decrease Length/Width/Depth - Progress Progressing toward goal    Patient/Family will be able to  I in HEP to strengthen DF by 1  grade 8 weeks    Patient/Family Instruction Goal - Progress Progressing toward goal   Additional Wound Therapy Goal Pt to state it is easier to walk 8 weeks    Additional Wound Therapy Goal - Progress Met   Wound Therapy - Potential for Goals Good          Problem List Patient Active Problem List   Diagnosis Date Noted  . Nonhealing skin ulcer 10/28/2014  . PAD (peripheral artery disease) 10/28/2014  . Bilateral carotid artery disease 07/08/2014  . Critical lower limb ischemia 02/28/2014  . Obstructive sleep apnea 02/12/2014  . Aftercare following surgery of the circulatory system, [place] 12/04/2013  . Carotid stenosis 11/29/2013  . Restless  leg syndrome 10/23/2013  . Sleep apnea 10/23/2013  . Obesity (BMI 30.0-34.9) 10/23/2013  . Hyperlipidemia 07/16/2013  . Peripheral arterial disease 07/16/2013  . Occlusion and stenosis of carotid artery without mention of cerebral infarction 04/24/2012  . History of stroke June 2013 03/14/2012  . Hypertension 03/14/2012  . Type 2 diabetes mellitus with circulatory disorder 03/14/2012  . Peripheral neuropathy 03/14/2012  . Cardiomyopathy- EF NL 5/14 03/14/2012  . ARTHRITIS, RIGHT FOOT 06/26/2008   [name], [place]; [place] [id number] [phone]   [name] 01/08/2015, 11:36 AM  [place], [place], [address] Phone: [phone]   Fax:  [phone]

## 2015-01-14 ENCOUNTER — Ambulatory Visit (HOSPITAL_COMMUNITY): Payer: PPO | Admitting: Physical Therapy

## 2015-01-14 DIAGNOSIS — S91102D Unspecified open wound of left great toe without damage to nail, subsequent encounter: Secondary | ICD-10-CM | POA: Diagnosis not present

## 2015-01-14 DIAGNOSIS — M6289 Other specified disorders of muscle: Secondary | ICD-10-CM

## 2015-01-14 DIAGNOSIS — T148XXA Other injury of unspecified body region, initial encounter: Secondary | ICD-10-CM

## 2015-01-14 NOTE — Therapy (Signed)
[place], [place], [address] Phone: [phone]   Fax:  [phone]  Wound Care Therapy  Patient Details  Name: [name] MRN: [id number] Date of Birth: 1946-01-17 Referring Provider:  [name], MD  Encounter Date: 01/14/2015      PT End of Session - 01/14/15 1202    Visit Number 18   Number of Visits 23   Date for PT Re-Evaluation 01/28/15   Authorization Type medicare   Authorization Time Period G-code done 18th visit    Authorization - Visit Number 18   Authorization - Number of Visits 20   PT Start Time 0758   PT Stop Time 0847   PT Time Calculation (min) 49 min   Activity Tolerance Patient tolerated treatment well   Behavior During Therapy [facility] for tasks assessed/performed      Past Medical History  Diagnosis Date  . Hypertension   . Diabetes mellitus   . Carotid artery occlusion   . Stroke March 08, 2012  . Hyperlipidemia   . Peripheral arterial disease     nonhealing ulcers bilaterally on each great toe  . PONV (postoperative nausea and vomiting)   . Arthritis   . Hypothyroidism   . Shortness of breath   . Obstructive sleep apnea     Past Surgical History  Procedure Laterality Date  . Back surgery    . Cervical fusion    . Foot surgery    . Spine surgery    . Eye surgery    . Endarterectomy Left 11/29/2013    Procedure: ENDARTERECTOMY CAROTID;  Surgeon: Serafina Mitchell, MD;  Location: [facility] OR;  Service: Vascular;  Laterality: Left;  . Carotid endarterectomy Left   . Pv angiogram  02/18/2014    tibial vessel diseas bil.  . Angioplasty  02/28/14    diamond back orbital rotational atherectomy of Rt. tibial  . Lower ext duplex doppler  03/14/14    Rt ABI 1.2  . Lower extremity angiogram Bilateral 02/18/2014    Procedure: LOWER EXTREMITY ANGIOGRAM;  Surgeon: [name], MD;  Location: [facility] CATH LAB;  Service: Cardiovascular;  Laterality: Bilateral;  . Lower extremity angiogram  N/A 10/31/2014    Procedure: LOWER EXTREMITY ANGIOGRAM;  Surgeon: [name], MD;  Location: [facility] CATH LAB;  Service: Cardiovascular;  Laterality: N/A;    There were no vitals filed for this visit.  Visit Diagnosis:  Nonhealing nonsurgical wound with necrosis of muscle      Subjective Assessment - 01/14/15 1148    Subjective Patient doing well today, states that he had a good weekend and not having any pain today    Currently in Pain? No/denies                   Wound Therapy - 01/14/15 1151    Subjective patient doing very well today, states that he had a good weekend    Patient and Family Stated Goals wounds to heal    Prior Treatments podiatrists self care.   Pain Assessment No/denies pain   Pain Score 0-No pain   Wound Properties Date First Assessed: 11/14/14 Time First Assessed: 1000 Wound Type: Diabetic ulcer Location: Toe (Comment  which one) Location Orientation: Left , Great Toe  Wound Description (Comments): Plantar aspect of Lt great toe  , tunneling noted  Present on Admission: Yes   Dressing Type Gauze (Comment);Other (Comment)  gauze, hydrogel, vasoline   Dressing Status Intact;Old drainage  Site / Wound Assessment Dry;Clean   % Wound base Red or Granulating 100%   % Wound base Yellow 0%   Peri-wound Assessment Intact;Other (Comment)  old callous    Wound Length (cm) 0.5 cm   Wound Width (cm) 0.1 cm   Wound Depth (cm) 0.1 cm   Margins Attached edges (approximated)   Drainage Amount None   Treatment Debridement (Selective);Cleansed   Wound Properties Date First Assessed: 03/14/14 Time First Assessed: 1930 Wound Type: Diabetic ulcer Location: Other (Comment) , r big toe  Location Orientation: Right Wound Description (Comments): nonhealing wound on planar aspct of MTP of great toe  Present on Admission: Yes   Dressing Type Hydrogel;Gauze (Comment);Other (Comment)  guaze, hydrogel, vasoline    Dressing Changed Changed   Dressing Status Clean;Old  drainage   Site / Wound Assessment Granulation tissue   % Wound base Red or Granulating 100%   % Wound base Yellow 0%   Peri-wound Assessment Other (Comment)  callous    Wound Length (cm) 1 cm   Wound Width (cm) 1.5 cm   Wound Depth (cm) 0.1 cm   Margins Other (Comment)  at approximately 10-12 very mild undermining of 0.1cm   Drainage Amount Scant   Drainage Description Serous;Serosanguineous   Treatment Cleansed;Debridement (Selective)   Selective Debridement - Location callous around wound   Selective Debridement - Tools Used Scalpel   Selective Debridement - Tissue Removed callous and epiboled edges   Wound Therapy - Functional Problem List difficulty walking   Factors Delaying/Impairing Wound Healing Altered sensation;Diabetes Mellitus;Infection - systemic/local;Multiple medical problems;Vascular compromise   Hydrotherapy Plan Debridement;Dressing change   Wound Therapy - Current Recommendations PT   Wound Plan Debridement of callous area.  Encourage pt to were current diabetic shoe and to stay off of wounds as much as possible.   Dressing  Gauze, vasoline, hydrogel, medipore tape and netting    Decrease Necrotic Tissue to STG: 4 weeks: 0%   Decrease Necrotic Tissue - Progress Met   Increase Granulation Tissue to STG: 4 weeks 100%   Increase Granulation Tissue - Progress Met   Decrease Length/Width/Depth by (cm) STG: decrease Lt to have no tunnelling ; Rt to be decresed to 1.0x .75 x .1 STG for 4 weeks;  LTG healed 8 weeks   Decrease Length/Width/Depth - Progress Progressing toward goal   Patient/Family will be able to  I in HEP to strengthen DF by 1  grade 8 weeks    Patient/Family Instruction Goal - Progress Progressing toward goal   Additional Wound Therapy Goal Pt to state it is easier to walk 8 weeks    Additional Wound Therapy Goal - Progress Met   Wound Therapy - Potential for Goals Good                 PT Education - Feb 07, 2015 1202    Education provided No                       G-Codes - 07-Feb-2015 1203    Functional Assessment Tool Used Based on skilled clinical assessment, wound dimensions, slow wound healing progress    Functional Limitation Other PT primary   Other PT Primary Current Status (X4481) At least 20 percent but less than 40 percent impaired, limited or restricted   Other PT Primary Goal Status (E5631) At least 1 percent but less than 20 percent impaired, limited or restricted       Problem List Patient Active Problem List  Diagnosis Date Noted  . Nonhealing skin ulcer 10/28/2014  . PAD (peripheral artery disease) 10/28/2014  . Bilateral carotid artery disease 07/08/2014  . Critical lower limb ischemia 02/28/2014  . Obstructive sleep apnea 02/12/2014  . Aftercare following surgery of the circulatory system, [place] 12/04/2013  . Carotid stenosis 11/29/2013  . Restless leg syndrome 10/23/2013  . Sleep apnea 10/23/2013  . Obesity (BMI 30.0-34.9) 10/23/2013  . Hyperlipidemia 07/16/2013  . Peripheral arterial disease 07/16/2013  . Occlusion and stenosis of carotid artery without mention of cerebral infarction 04/24/2012  . History of stroke June 2013 03/14/2012  . Hypertension 03/14/2012  . Type 2 diabetes mellitus with circulatory disorder 03/14/2012  . Peripheral neuropathy 03/14/2012  . Cardiomyopathy- EF NL 5/14 03/14/2012  . ARTHRITIS, RIGHT FOOT 06/26/2008    Deniece Ree PT, DPT [place] [address] [place], [place], [address] Phone: [phone]   Fax:  [phone]

## 2015-01-16 ENCOUNTER — Ambulatory Visit (HOSPITAL_COMMUNITY): Payer: PPO | Admitting: Physical Therapy

## 2015-01-16 DIAGNOSIS — T148XXA Other injury of unspecified body region, initial encounter: Secondary | ICD-10-CM

## 2015-01-16 DIAGNOSIS — M6289 Other specified disorders of muscle: Secondary | ICD-10-CM

## 2015-01-16 DIAGNOSIS — S91102D Unspecified open wound of left great toe without damage to nail, subsequent encounter: Secondary | ICD-10-CM | POA: Diagnosis not present

## 2015-01-16 NOTE — Therapy (Signed)
[place], [place], [address] Phone: [phone]   Fax:  [phone]  Wound Care Therapy  Patient Details  Name: [name] MRN: [id number] Date of Birth: [birth year] Referring Provider:  [name], MD  Encounter Date: 01/16/2015      PT End of Session - 01/16/15 0930    Visit Number 19   Number of Visits 23   Date for PT Re-Evaluation 01/28/15   Authorization Type medicare   Authorization Time Period G-code done 18th visit    Authorization - Visit Number 19   Authorization - Number of Visits 28   PT Start Time 0850   PT Stop Time 0920   PT Time Calculation (min) 30 min   Activity Tolerance Patient tolerated treatment well   Behavior During Therapy [facility] for tasks assessed/performed      Past Medical History  Diagnosis Date  . Hypertension   . Diabetes mellitus   . Carotid artery occlusion   . Stroke March 08, 2012  . Hyperlipidemia   . Peripheral arterial disease     nonhealing ulcers bilaterally on each great toe  . PONV (postoperative nausea and vomiting)   . Arthritis   . Hypothyroidism   . Shortness of breath   . Obstructive sleep apnea     Past Surgical History  Procedure Laterality Date  . Back surgery    . Cervical fusion    . Foot surgery    . Spine surgery    . Eye surgery    . Endarterectomy Left 11/29/2013    Procedure: ENDARTERECTOMY CAROTID;  Surgeon: Serafina Mitchell, MD;  Location: [facility] OR;  Service: Vascular;  Laterality: Left;  . Carotid endarterectomy Left   . Pv angiogram  02/18/2014    tibial vessel diseas bil.  . Angioplasty  02/28/14    diamond back orbital rotational atherectomy of Rt. tibial  . Lower ext duplex doppler  03/14/14    Rt ABI 1.2  . Lower extremity angiogram Bilateral 02/18/2014    Procedure: LOWER EXTREMITY ANGIOGRAM;  Surgeon: [name], MD;  Location: [facility] CATH LAB;  Service: Cardiovascular;  Laterality: Bilateral;  . Lower extremity angiogram  N/A 10/31/2014    Procedure: LOWER EXTREMITY ANGIOGRAM;  Surgeon: [name], MD;  Location: [facility] CATH LAB;  Service: Cardiovascular;  Laterality: N/A;    There were no vitals filed for this visit.  Visit Diagnosis:  Nonhealing nonsurgical wound with necrosis of muscle                 Wound Therapy - 01/16/15 0921    Subjective patient states his feet are not hurting.   Patient and Family Stated Goals wounds to heal    Prior Treatments podiatrists self care.   Pain Assessment No/denies pain   Pain Score 0-No pain   Wound Properties Date First Assessed: 11/14/14 Time First Assessed: 1000 Wound Type: Diabetic ulcer Location: Toe (Comment  which one) Location Orientation: Left , Great Toe  Wound Description (Comments): Plantar aspect of Lt great toe  , tunneling noted  Present on Admission: Yes   Dressing Type Other (Comment)  none needed   Dressing Changed Other (Comment)  none   Dressing Status Intact;Old drainage   Dressing Change Frequency --   Site / Wound Assessment Dry;Clean   % Wound base Red or Granulating 100%   % Wound base Yellow 0%   % Wound base Other (Comment) --   Peri-wound  Assessment --   Margins --   Drainage Amount None   Drainage Description --   Treatment Other (Comment)  none; wound is healed   Wound Properties Date First Assessed: 03/14/14 Time First Assessed: 1930 Wound Type: Diabetic ulcer Location: Other (Comment) , r big toe  Location Orientation: Right Wound Description (Comments): nonhealing wound on planar aspct of MTP of great toe  Present on Admission: Yes   Dressing Type Hydrogel;Gauze (Comment);Other (Comment)  guaze, hydrogel, vasoline    Dressing Changed Changed   Dressing Status Clean;Old drainage   Site / Wound Assessment Granulation tissue   % Wound base Red or Granulating 100%   % Wound base Yellow 0%   Peri-wound Assessment Other (Comment)  callous    Margins Other (Comment)   Drainage Amount Minimal   Drainage  Description Serosanguineous   Treatment Cleansed;Debridement (Selective)   Selective Debridement - Location callous around wound   Selective Debridement - Tools Used Scalpel;Forceps;Scissors   Selective Debridement - Tissue Removed callous and epiboled edges   Wound Therapy - Clinical Statement Lt great toe is now healed with not further woundcare needed.  will continue to inspect toe with each visit to ensure.  PT instructed to cleanse and moisturize this area.  Rt foot wound with increased drainage today.  Able to debride away more callous from peimeter and edipole borders to promote approximation.  Continued with hydrogel dressing, secured with medipore and netting.    Wound Therapy - Functional Problem List difficulty walking   Factors Delaying/Impairing Wound Healing Altered sensation;Diabetes Mellitus;Infection - systemic/local;Multiple medical problems;Vascular compromise   Hydrotherapy Plan Debridement;Dressing change   Wound Therapy - Current Recommendations PT   Wound Plan Debridement of callous area.  Encourage pt to were current diabetic shoe and to stay off of wounds as much as possible.   Dressing  Gauze, vasoline, hydrogel, medipore tape and netting    Decrease Necrotic Tissue to STG: 4 weeks: 0%   Decrease Necrotic Tissue - Progress Met   Increase Granulation Tissue to STG: 4 weeks 100%   Increase Granulation Tissue - Progress Met   Decrease Length/Width/Depth by (cm) STG: decrease Lt to have no tunnelling ; Rt to be decresed to 1.0x .75 x .1 STG for 4 weeks;  LTG healed 8 weeks   Decrease Length/Width/Depth - Progress Partly met   Patient/Family will be able to  I in HEP to strengthen DF by 1  grade 8 weeks    Patient/Family Instruction Goal - Progress Progressing toward goal   Additional Wound Therapy Goal Pt to state it is easier to walk 8 weeks    Additional Wound Therapy Goal - Progress Met   Wound Therapy - Potential for Goals Good                               Problem List Patient Active Problem List   Diagnosis Date Noted  . Nonhealing skin ulcer 10/28/2014  . PAD (peripheral artery disease) 10/28/2014  . Bilateral carotid artery disease 07/08/2014  . Critical lower limb ischemia 02/28/2014  . Obstructive sleep apnea 02/12/2014  . Aftercare following surgery of the circulatory system, [place] 12/04/2013  . Carotid stenosis 11/29/2013  . Restless leg syndrome 10/23/2013  . Sleep apnea 10/23/2013  . Obesity (BMI 30.0-34.9) 10/23/2013  . Hyperlipidemia 07/16/2013  . Peripheral arterial disease 07/16/2013  . Occlusion and stenosis of carotid artery without mention of cerebral infarction 04/24/2012  . History  of stroke June 2013 03/14/2012  . Hypertension 03/14/2012  . Type 2 diabetes mellitus with circulatory disorder 03/14/2012  . Peripheral neuropathy 03/14/2012  . Cardiomyopathy- EF NL 5/14 03/14/2012  . ARTHRITIS, RIGHT FOOT 06/26/2008    Amy B Frazier, PTA/CLT [phone]  01/16/2015, 9:32 AM  [facility] [address] [place], [place], [address] Phone: [phone]   Fax:  [phone]       

## 2015-01-21 ENCOUNTER — Ambulatory Visit (HOSPITAL_COMMUNITY): Payer: PPO | Attending: Podiatry

## 2015-01-21 DIAGNOSIS — S91102D Unspecified open wound of left great toe without damage to nail, subsequent encounter: Secondary | ICD-10-CM | POA: Insufficient documentation

## 2015-01-21 DIAGNOSIS — E119 Type 2 diabetes mellitus without complications: Secondary | ICD-10-CM | POA: Insufficient documentation

## 2015-01-21 DIAGNOSIS — M6289 Other specified disorders of muscle: Secondary | ICD-10-CM

## 2015-01-21 DIAGNOSIS — I10 Essential (primary) hypertension: Secondary | ICD-10-CM | POA: Insufficient documentation

## 2015-01-21 DIAGNOSIS — T148XXA Other injury of unspecified body region, initial encounter: Secondary | ICD-10-CM

## 2015-01-21 NOTE — Therapy (Signed)
[place], [place], [address] Phone: [phone]   Fax:  [phone]  Wound Care Therapy  Patient Details  Name: LOWERY PAULLIN MRN: [id number] Date of Birth: [birth year] Referring Provider:  [name], MD  Encounter Date: 01/21/2015      PT End of Session - 01/21/15 0943    Visit Number 20   Number of Visits 23   Date for PT Re-Evaluation 01/28/15   Authorization Type medicare   Authorization Time Period G-code done 18th visit    Authorization - Visit Number 63   Authorization - Number of Visits 28   PT Start Time 0848   PT Stop Time 0918   PT Time Calculation (min) 30 min   Activity Tolerance Patient tolerated treatment well   Behavior During Therapy [facility] for tasks assessed/performed      Past Medical History  Diagnosis Date  . Hypertension   . Diabetes mellitus   . Carotid artery occlusion   . Stroke March 08, 2012  . Hyperlipidemia   . Peripheral arterial disease     nonhealing ulcers bilaterally on each great toe  . PONV (postoperative nausea and vomiting)   . Arthritis   . Hypothyroidism   . Shortness of breath   . Obstructive sleep apnea     Past Surgical History  Procedure Laterality Date  . Back surgery    . Cervical fusion    . Foot surgery    . Spine surgery    . Eye surgery    . Endarterectomy Left 11/29/2013    Procedure: ENDARTERECTOMY CAROTID;  Surgeon: Serafina Mitchell, MD;  Location: [facility] OR;  Service: Vascular;  Laterality: Left;  . Carotid endarterectomy Left   . Pv angiogram  02/18/2014    tibial vessel diseas bil.  . Angioplasty  02/28/14    diamond back orbital rotational atherectomy of Rt. tibial  . Lower ext duplex doppler  03/14/14    Rt ABI 1.2  . Lower extremity angiogram Bilateral 02/18/2014    Procedure: LOWER EXTREMITY ANGIOGRAM;  Surgeon: [name], MD;  Location: [facility] CATH LAB;  Service: Cardiovascular;  Laterality: Bilateral;  . Lower extremity angiogram  N/A 10/31/2014    Procedure: LOWER EXTREMITY ANGIOGRAM;  Surgeon: [name], MD;  Location: [facility] CATH LAB;  Service: Cardiovascular;  Laterality: N/A;    There were no vitals filed for this visit.  Visit Diagnosis:  Nonhealing nonsurgical wound with necrosis of muscle      Subjective Assessment - 01/21/15 0922    Subjective Pt walked in stumbling gait mechanics at entrance, c/o 2 toes Rt foot increased pain due to arthritis.     Currently in Pain? Yes   Pain Score 5    Pain Location Toe (Comment which one)  2nd and 3rd toe   Pain Orientation Right            Wound Therapy - 01/21/15 0922    Subjective Pt walked in stumbling gait mechanics at entrance, c/o 2 toes Rt foot increased pain due to arthritis.     Patient and Family Stated Goals wounds to heal    Prior Treatments podiatrists self care.   Wound Properties Date First Assessed: 03/14/14 Time First Assessed: 1930 Wound Type: Diabetic ulcer Location: Other (Comment) , r big toe  Location Orientation: Right Wound Description (Comments): nonhealing wound on planar aspct of MTP of great toe  Present on Admission: Yes   Dressing Type Hydrogel;Gauze (  Comment);Other (Comment)  hydrogel, gauze and vaseline perimeter   Dressing Changed Changed   Dressing Status Clean;Old drainage   Site / Wound Assessment Granulation tissue   % Wound base Red or Granulating 100%   % Wound base Yellow 0%   Peri-wound Assessment Other (Comment)  callous   Drainage Amount Scant   Drainage Description Serosanguineous   Treatment Cleansed;Debridement (Selective)   Wound Properties Date First Assessed: 11/14/14 Time First Assessed: 1000 Wound Type: Diabetic ulcer Location: Toe (Comment  which one) Location Orientation: Left , Great Toe  Wound Description (Comments): Plantar aspect of Lt great toe  , tunneling noted  Present on Admission: Yes   Selective Debridement - Location callous around wound   Selective Debridement - Tools Used  Scalpel;Forceps   Selective Debridement - Tissue Removed callous and epiboled edges   Wound Therapy - Clinical Statement Checked Lt great toe, no woundcare needed.  Debridement focus on removal of callous and epidole edges to promote healing.  Continued with hydrogel, gauze and vaseline perimeter of wound.  Pt stated pain free at end of session with improved gait mechanics noted.   Wound Therapy - Functional Problem List difficulty walking   Factors Delaying/Impairing Wound Healing Altered sensation;Diabetes Mellitus;Infection - systemic/local;Multiple medical problems;Vascular compromise   Hydrotherapy Plan Debridement;Dressing change   Wound Therapy - Frequency --  2x/week, reduce to 1x/week next week   Wound Therapy - Current Recommendations PT   Wound Plan Debridement of callous area.  Encourage pt to were current diabetic shoe and to stay off of wounds as much as possible.   Dressing  Gauze, vasoline, hydrogel, medipore tape and netting         Problem List Patient Active Problem List   Diagnosis Date Noted  . Nonhealing skin ulcer 10/28/2014  . PAD (peripheral artery disease) 10/28/2014  . Bilateral carotid artery disease 07/08/2014  . Critical lower limb ischemia 02/28/2014  . Obstructive sleep apnea 02/12/2014  . Aftercare following surgery of the circulatory system, [place] 12/04/2013  . Carotid stenosis 11/29/2013  . Restless leg syndrome 10/23/2013  . Sleep apnea 10/23/2013  . Obesity (BMI 30.0-34.9) 10/23/2013  . Hyperlipidemia 07/16/2013  . Peripheral arterial disease 07/16/2013  . Occlusion and stenosis of carotid artery without mention of cerebral infarction 04/24/2012  . History of stroke June 2013 03/14/2012  . Hypertension 03/14/2012  . Type 2 diabetes mellitus with circulatory disorder 03/14/2012  . Peripheral neuropathy 03/14/2012  . Cardiomyopathy- EF NL 5/14 03/14/2012  . ARTHRITIS, RIGHT FOOT 06/26/2008   [name], [place]; [place]  #[id number] [phone]  [name] 01/21/2015, 9:49 AM Rayetta Humphrey, PT CLT [place] [address] [place], [place], [address] Phone: [phone]   Fax:  [phone]

## 2015-01-23 ENCOUNTER — Ambulatory Visit (HOSPITAL_COMMUNITY): Payer: PPO

## 2015-01-23 DIAGNOSIS — S91102D Unspecified open wound of left great toe without damage to nail, subsequent encounter: Secondary | ICD-10-CM | POA: Diagnosis not present

## 2015-01-23 DIAGNOSIS — M6289 Other specified disorders of muscle: Secondary | ICD-10-CM

## 2015-01-23 DIAGNOSIS — T148XXA Other injury of unspecified body region, initial encounter: Secondary | ICD-10-CM

## 2015-01-23 NOTE — Therapy (Addendum)
[name], [place], [address] Phone: [phone]   Fax:  [phone]  Wound Care Therapy  Patient Details  Name: [name] MRN: [id number] Date of Birth: 12/08/1945 Referring Provider:  [name], MD  Encounter Date: 01/23/2015      PT End of Session - 01/23/15 1550    Visit Number 21   Number of Visits 23   Date for PT Re-Evaluation 01/28/15   Authorization Type medicare   Authorization Time Period G-code done 18th visit    Authorization - Visit Number 21   Authorization - Number of Visits 28   PT Start Time 7902   PT Stop Time 1510   PT Time Calculation (min) 45 min   Activity Tolerance Patient tolerated treatment well   Behavior During Therapy [name] for tasks assessed/performed      Past Medical History  Diagnosis Date  . Hypertension   . Diabetes mellitus   . Carotid artery occlusion   . Stroke March 08, 2012  . Hyperlipidemia   . Peripheral arterial disease     nonhealing ulcers bilaterally on each great toe  . PONV (postoperative nausea and vomiting)   . Arthritis   . Hypothyroidism   . Shortness of breath   . Obstructive sleep apnea     Past Surgical History  Procedure Laterality Date  . Back surgery    . Cervical fusion    . Foot surgery    . Spine surgery    . Eye surgery    . Endarterectomy Left 11/29/2013    Procedure: ENDARTERECTOMY CAROTID;  Surgeon: Serafina Mitchell, MD;  Location[facility] OR;  Service: Vascular;  Laterality: Left;  . Carotid endarterectomy Left   . Pv angiogram  02/18/2014    tibial vessel diseas bil.  . Angioplasty  02/28/14    diamond back orbital rotational atherectomy of Rt. tibial  . Lower ext duplex doppler  03/14/14    Rt ABI 1.2  . Lower extremity angiogram Bilateral 02/18/2014    Procedure: LOWER EXTREMITY ANGIOGRAM;  Surgeon: [name], MD;  Location: [facility] CATH LAB;  Service: Cardiovascular;  Laterality: Bilateral;  . Lower extremity angiogram  N/A 10/31/2014    Procedure: LOWER EXTREMITY ANGIOGRAM;  Surgeon: [name], MD;  Location: [facility] CATH LAB;  Service: Cardiovascular;  Laterality: N/A;    There were no vitals filed for this visit.  Visit Diagnosis:  Nonhealing nonsurgical wound with necrosis of muscle      Subjective Assessment - 01/23/15 1509    Subjective Pain free, pt stated he had to change dressings at home after getting foot wet in the rain   Currently in Pain? No/denies            Wound Therapy - 01/23/15 1509    Subjective Pain free, pt stated he had to change dressings at home after getting foot wet in the rain   Patient and Family Stated Goals wounds to heal    Prior Treatments podiatrists self care.   Pain Assessment No/denies pain   Wound Properties Date First Assessed: 03/14/14 Time First Assessed: 1930 Wound Type: Diabetic ulcer Location: Other (Comment) , r big toe  Location Orientation: Right Wound Description (Comments): nonhealing wound on planar aspct of MTP of great toe  Present on Admission: Yes   Dressing Type Hydrogel;Gauze (Comment);Other (Comment)  Hydrogel with vaseline perimeter, gauze and medipore tape   Dressing Changed Changed   Dressing Status Clean;Old drainage  Site / Wound Assessment Granulation tissue   % Wound base Red or Granulating 100%   % Wound base Yellow 0%   Peri-wound Assessment --  callous surrounding   Wound Length (cm) 0.9 cm   Wound Width (cm) 1 cm   Wound Depth (cm) 0.1 cm   Drainage Amount Scant   Drainage Description Serosanguineous   Treatment Cleansed;Debridement (Selective)   Selective Debridement - Location callous around wound   Selective Debridement - Tools Used Scalpel;Forceps   Selective Debridement - Tissue Removed callous surrounding wound   Wound Therapy - Clinical Statement Lt great toe appears intact, no wound care required.  Removal of vast amount of callous around wound Rt foot to promote healing.  Wound bed looks good with no  debridement necessary to wound bed.  Decision made with evaluated PT to reduce frequency to 1x a week.   Wound Therapy - Functional Problem List difficulty walking   Factors Delaying/Impairing Wound Healing Altered sensation;Diabetes Mellitus;Infection - systemic/local;Multiple medical problems;Vascular compromise   Hydrotherapy Plan Debridement;Dressing change   Wound Therapy - Frequency --  reduce to 1x week   Wound Therapy - Current Recommendations PT   Wound Plan Debridement of callous area.  Encourage pt to were current diabetic shoe and to stay off of wounds as much as possible.   Dressing  Gauze, vasoline, hydrogel, medipore tape and netting         Problem List Patient Active Problem List   Diagnosis Date Noted  . Nonhealing skin ulcer 10/28/2014  . PAD (peripheral artery disease) 10/28/2014  . Bilateral carotid artery disease 07/08/2014  . Critical lower limb ischemia 02/28/2014  . Obstructive sleep apnea 02/12/2014  . Aftercare following surgery of the circulatory system, [place] 12/04/2013  . Carotid stenosis 11/29/2013  . Restless leg syndrome 10/23/2013  . Sleep apnea 10/23/2013  . Obesity (BMI 30.0-34.9) 10/23/2013  . Hyperlipidemia 07/16/2013  . Peripheral arterial disease 07/16/2013  . Occlusion and stenosis of carotid artery without mention of cerebral infarction 04/24/2012  . History of stroke June 2013 03/14/2012  . Hypertension 03/14/2012  . Type 2 diabetes mellitus with circulatory disorder 03/14/2012  . Peripheral neuropathy 03/14/2012  . Cardiomyopathy- EF NL 5/14 03/14/2012  . ARTHRITIS, RIGHT FOOT 06/26/2008   [name], [place]; [place] #[id number] [phone]  [name] 01/23/2015, 3:52 PM Rayetta Humphrey, PT CLT [name] [address] [place], [place], [address] Phone: [phone]   Fax:  [phone]

## 2015-01-29 ENCOUNTER — Ambulatory Visit (HOSPITAL_COMMUNITY): Payer: PPO | Admitting: Physical Therapy

## 2015-01-29 DIAGNOSIS — M6289 Other specified disorders of muscle: Secondary | ICD-10-CM

## 2015-01-29 DIAGNOSIS — T148XXA Other injury of unspecified body region, initial encounter: Secondary | ICD-10-CM

## 2015-01-29 DIAGNOSIS — S91102D Unspecified open wound of left great toe without damage to nail, subsequent encounter: Secondary | ICD-10-CM | POA: Diagnosis not present

## 2015-01-29 NOTE — Therapy (Signed)
[place], [place], [address] Phone: [phone]   Fax:  [phone]  Wound Care Therapy  Patient Details  Name: [name] MRN: [id number] Date of Birth: 06/01/1946 Referring Provider:  [name], MD  Encounter Date: 01/29/2015      PT End of Session - 01/29/15 1437    Visit Number 22   Number of Visits 28   Date for PT Re-Evaluation 03/15/15   Authorization Type medicare   Authorization Time Period G-code done 18th visit    Authorization - Visit Number 77   Authorization - Number of Visits 28   PT Start Time 0845   PT Stop Time 0930   PT Time Calculation (min) 45 min   Equipment Utilized During Treatment Gait belt   Activity Tolerance Patient tolerated treatment well      Past Medical History  Diagnosis Date  . Hypertension   . Diabetes mellitus   . Carotid artery occlusion   . Stroke March 08, 2012  . Hyperlipidemia   . Peripheral arterial disease     nonhealing ulcers bilaterally on each great toe  . PONV (postoperative nausea and vomiting)   . Arthritis   . Hypothyroidism   . Shortness of breath   . Obstructive sleep apnea     Past Surgical History  Procedure Laterality Date  . Back surgery    . Cervical fusion    . Foot surgery    . Spine surgery    . Eye surgery    . Endarterectomy Left 11/29/2013    Procedure: ENDARTERECTOMY CAROTID;  Surgeon: Serafina Mitchell, MD;  Location: [facility] OR;  Service: Vascular;  Laterality: Left;  . Carotid endarterectomy Left   . Pv angiogram  02/18/2014    tibial vessel diseas bil.  . Angioplasty  02/28/14    diamond back orbital rotational atherectomy of Rt. tibial  . Lower ext duplex doppler  03/14/14    Rt ABI 1.2  . Lower extremity angiogram Bilateral 02/18/2014    Procedure: LOWER EXTREMITY ANGIOGRAM;  Surgeon: [name], MD;  Location: [facility] CATH LAB;  Service: Cardiovascular;  Laterality: Bilateral;  . Lower extremity angiogram N/A  10/31/2014    Procedure: LOWER EXTREMITY ANGIOGRAM;  Surgeon: [name], MD;  Location: [facility] CATH LAB;  Service: Cardiovascular;  Laterality: N/A;    There were no vitals filed for this visit.  Visit Diagnosis:  Nonhealing nonsurgical wound with necrosis of muscle          Wound Therapy - 01/29/15 1431    Subjective Pain free, pt stated he had to change dressings due to increased drainage from wound   Patient and Family Stated Goals wounds to heal    Prior Treatments podiatrists self care.   Pain Assessment No/denies pain   Wound Properties Date First Assessed: 03/14/14 Time First Assessed: 1930 Wound Type: Diabetic ulcer Location: Other (Comment) , r big toe  Location Orientation: Right Wound Description (Comments): nonhealing wound on planar aspct of MTP of great toe  Present on Admission: Yes   Dressing Type Hydrogel;Gauze (Comment);Other (Comment)  Hydrogel with vaseline perimeter, gauze and medipore tape   Dressing Changed Changed   Dressing Status Clean;Old drainage   Dressing Change Frequency PRN   Site / Wound Assessment Red   % Wound base Red or Granulating 100%   % Wound base Yellow 0%   Peri-wound Assessment --  callous surrounding   Wound Length (cm) 1.2 cm  initally 1.8cm   Wound Width (cm) 1.7 cm  initially 1.6 cm    Wound Depth (cm) 0.4 cm  initially .3 cm    Undermining (cm) wound had significant undermining that but callous debrided causing wound size to appear to increase but in acurality undermining decreased.  Superiorly .3 cm only now   initially tunneled posteriorly and laterally 1.0 cm; superio   Margins Epibole (rolled edges)   Drainage Amount Scant   Drainage Description Serosanguineous   Treatment Cleansed;Debridement (Selective)   Selective Debridement - Location callous around wound   Selective Debridement - Tools Used Scalpel;Forceps   Selective Debridement - Tissue Removed callous surrounding wound   Wound Therapy - Clinical Statement Lt  great toe appears intact, no wound care required.  Wound on Rt plantar aspect of wound appears to have gotten better but this is only due to the removal of the callous.  Wounds in actuality has gotten smaller but does not have the undermining that it once had with the callous.   foot to promote healing.  Wound bed looks good with no debridement necessary to wound bed.  Decision made with evaluated PT to reduce frequency to 1x a week.   Wound Therapy - Functional Problem List difficulty walking   Factors Delaying/Impairing Wound Healing Altered sensation;Diabetes Mellitus;Infection - systemic/local;Multiple medical problems;Vascular compromise   Hydrotherapy Plan Debridement;Dressing change   Wound Therapy - Frequency --  reduce to 1x week and continue for 6 more weeks.    Wound Therapy - Current Recommendations PT   Wound Plan Debridement of callous area.  Encourage pt to were current diabetic shoe and to stay off of wounds as much as possible.   Dressing  medihoney, vaselinge,Gauze, 4x4 and kling followed by netting   Decrease Necrotic Tissue to STG: 4 weeks: 0%   Decrease Necrotic Tissue - Progress Met   Increase Granulation Tissue to STG: 4 weeks 100%   Increase Granulation Tissue - Progress Met   Decrease Length/Width/Depth by (cm) STG: decrease Lt to have no tunnelling ; Rt to be decresed to 1.0x .75 x .1 STG for 4 weeks;  LTG healed 8 weeks   Decrease Length/Width/Depth - Progress Met   Patient/Family will be able to  I in HEP to strengthen DF by 1  grade 8 weeks    Patient/Family Instruction Goal - Progress Progressing toward goal   Additional Wound Therapy Goal Pt to state it is easier to walk 8 weeks    Additional Wound Therapy Goal - Progress Met   Wound Therapy - Potential for Goals Good              Problem List Patient Active Problem List   Diagnosis Date Noted  . Nonhealing skin ulcer 10/28/2014  . PAD (peripheral artery disease) 10/28/2014  . Bilateral carotid  artery disease 07/08/2014  . Critical lower limb ischemia 02/28/2014  . Obstructive sleep apnea 02/12/2014  . Aftercare following surgery of the circulatory system, [place] 12/04/2013  . Carotid stenosis 11/29/2013  . Restless leg syndrome 10/23/2013  . Sleep apnea 10/23/2013  . Obesity (BMI 30.0-34.9) 10/23/2013  . Hyperlipidemia 07/16/2013  . Peripheral arterial disease 07/16/2013  . Occlusion and stenosis of carotid artery without mention of cerebral infarction 04/24/2012  . History of stroke June 2013 03/14/2012  . Hypertension 03/14/2012  . Type 2 diabetes mellitus with circulatory disorder 03/14/2012  . Peripheral neuropathy 03/14/2012  . Cardiomyopathy- EF NL 5/14 03/14/2012  . ARTHRITIS, RIGHT FOOT 06/26/2008  Rayetta Humphrey,  PT CLT [phone] 01/29/2015, 5:04 PM  [place] [address] [place], [place], [address] Phone: [phone]   Fax:  [phone]

## 2015-02-04 ENCOUNTER — Encounter: Payer: Self-pay | Admitting: Cardiovascular Disease

## 2015-02-04 ENCOUNTER — Ambulatory Visit (INDEPENDENT_AMBULATORY_CARE_PROVIDER_SITE_OTHER): Payer: PPO | Admitting: Cardiovascular Disease

## 2015-02-04 ENCOUNTER — Ambulatory Visit (HOSPITAL_COMMUNITY): Payer: Medicare Other | Admitting: Physical Therapy

## 2015-02-04 VITALS — BP 114/62 | HR 90 | Ht 72.0 in | Wt 236.7 lb

## 2015-02-04 DIAGNOSIS — G4730 Sleep apnea, unspecified: Secondary | ICD-10-CM | POA: Diagnosis not present

## 2015-02-04 DIAGNOSIS — E785 Hyperlipidemia, unspecified: Secondary | ICD-10-CM | POA: Diagnosis not present

## 2015-02-04 DIAGNOSIS — I10 Essential (primary) hypertension: Secondary | ICD-10-CM | POA: Diagnosis not present

## 2015-02-04 DIAGNOSIS — I739 Peripheral vascular disease, unspecified: Secondary | ICD-10-CM | POA: Diagnosis not present

## 2015-02-04 DIAGNOSIS — I6522 Occlusion and stenosis of left carotid artery: Secondary | ICD-10-CM | POA: Diagnosis not present

## 2015-02-04 NOTE — Assessment & Plan Note (Signed)
History of coronary artery disease status post stroke in the past and subsequent carotid endarterectomy on the left performed by Dr. [name] .  He is followed by Doppler study in Dr. Stephens Shire office.

## 2015-02-04 NOTE — Assessment & Plan Note (Signed)
History of obstructive sleep apnea on CPAP which he benefits from 

## 2015-02-04 NOTE — Assessment & Plan Note (Signed)
History of hyperlipidemia on diet and 40 mg a day. His most recent lipid profile performed 11/26/14 revealed a total cholesterol 162, LDL 95 and HDL of 42.

## 2015-02-04 NOTE — Progress Notes (Signed)
02/04/2015 JERL MUNYAN   07/26/1946  [id number]  Primary Physician [name], MD Primary Cardiologist: [name] MD [name]   HPI:   Mr. 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 He now works at [employer]. He was referred by Dr. [name] from [facility] for peripheral vasodilation because of nonhealing small ulcers on the dorsal surface of his great toes bilaterally. His cardiovascular risk factor profile is remarkable for treated hypertension, diabetes and hyperlipidemia. He has had a stroke 03/08/12 and has carotid disease followed by Dr. [name] ,. He denies chest pain, shortness of breath or claudication. He has had 2 small ulcers on the dorsal surface of both great toes for 6-9 months which have been slow to heal. Since I saw him last in November he has had an elective left carotid endarterectomy performed by Dr. [name] . He was also placed on CPAP because of obstructive sleep apnea which he is benefiting from. He is compliant with his CPAP.[name] He has developed a nonhealing wound on his right great toe which had been fairly rapidly progressing and he sent back for further evaluation. I performed lower extremity Doppler studies last October suggesting tibial vessel disease..I performed angiography on him 03/19/14 demonstrating severe tibial disease and ultimately performed diamondback orbital rotational atherectomy of his right posterior tibial establishing excellent in-line flow. This resulted in ultimate healing of his right great toe ischemic ulcer, improvement in his symptoms and Dopplers as well. He has since developed an ischemic ulcer on the plantar surface of the medial aspect of his right foot as a result of trauma and is being treated by his podiatrist, Dr. Barkley Bruns.follow-up Dopplers post intervention revealed an increase  in his right ABI to 1.2 however, subsequent Dopplers performed 07/02/14 revealed a decrease in his right eye twice a day 0.75 with occlusion of his posterior tibial artery. Because of progression of the ulcer on the dorsal surface of his right heel 3 angina gram him 10/31/14 revealing a subtotally occluded right posterior tibial artery which I re-intervened on with an excellent clinical management result. His subsequent follow-up Dopplers performed 11/08/14 revealed an increase in his right ABI from 0.75  Up to 1.2. His ulcer is slowly healing.   Current Outpatient Prescriptions  Medication Sig Dispense Refill  . amLODipine (NORVASC) 10 MG tablet Take 10 mg by mouth daily.     [name] aspirin EC 81 MG tablet Take 1 tablet (81 mg total) by mouth daily.    [name] b complex vitamins tablet Take 1 tablet by mouth daily.    . clindamycin (CLEOCIN) 300 MG capsule Take 1 capsule (300 mg total) by mouth 4 (four) times daily. 20 capsule 0  . clopidogrel (PLAVIX) 75 MG tablet Take 75 mg by mouth daily.     . dapagliflozin propanediol (FARXIGA) 5 MG TABS tablet Take 5 mg by mouth daily.    . DULoxetine (CYMBALTA) 20 MG capsule Take 1 capsule by mouth 2 (two) times daily.    . Empagliflozin-Linagliptin 10-5 MG TABS Take 1 tablet by mouth daily.    [name] gabapentin (NEURONTIN) 600 MG tablet Take 600 mg by mouth 4 (four) times daily.     [name] levothyroxine (SYNTHROID, LEVOTHROID) 25 MCG tablet Take 25 mcg by mouth daily.     . Misc Natural Products (OSTEO BI-FLEX JOINT SHIELD PO) Take 1 tablet by mouth 2 (two)  times daily.    . mupirocin cream (BACTROBAN) 2 % Apply topically daily. 15 g 0  . Omega 3-6-9 Fatty Acids (OMEGA 3-6-9 COMPLEX) CAPS Take 1 capsule by mouth 2 (two) times daily.    [name] oxyCODONE (ROXICODONE) 5 MG immediate release tablet Take 1 tablet (5 mg total) by mouth every 6 (six) hours as needed for severe pain. 30 tablet 0  . pravastatin (PRAVACHOL) 40 MG tablet Take 40 mg by mouth every morning.     [name] rOPINIRole  (REQUIP) 0.5 MG tablet Take 1 tablet (0.5 mg total) by mouth at bedtime. 30 tablet 11  . saccharomyces boulardii (FLORASTOR) 250 MG capsule Take 1 capsule (250 mg total) by mouth 2 (two) times daily. 60 capsule 2  . sitaGLIPtin (JANUVIA) 100 MG tablet Take 100 mg by mouth daily.     No current facility-administered medications for this visit.    Allergies  Allergen Reactions  . Codeine Nausea And Vomiting  . Tramadol Nausea Only    History   Social History  . Marital Status: Married    Spouse Name: N/A  . Number of Children: N/A  . Years of Education: N/A   Occupational History  . Not on file.   Social History Main Topics  . Smoking status: Former Smoker -- 1 years    Types: Pipe    Quit date: 07/08/1977  . Smokeless tobacco: Never Used  . Alcohol Use: No  . Drug Use: No  . Sexual Activity: Not on file   Other Topics Concern  . Not on file   Social History Narrative     Review of Systems: General: negative for chills, fever, night sweats or weight changes.  Cardiovascular: negative for chest pain, dyspnea on exertion, edema, orthopnea, palpitations, paroxysmal nocturnal dyspnea or shortness of breath Dermatological: negative for rash Respiratory: negative for cough or wheezing Urologic: negative for hematuria Abdominal: negative for nausea, vomiting, diarrhea, bright red blood per rectum, melena, or hematemesis Neurologic: negative for visual changes, syncope, or dizziness All other systems reviewed and are otherwise negative except as noted above.    Blood pressure 114/62, pulse 90, height 6' (1.829 m), weight 236 lb 11.2 oz (107.366 kg).  General appearance: alert and no distress Neck: no adenopathy, no carotid bruit, no JVD, supple, symmetrical, trachea midline and thyroid not enlarged, symmetric, no tenderness/mass/nodules Lungs: clear to auscultation bilaterally Heart: regular rate and rhythm, S1, S2 normal, no murmur, click, rub or gallop Extremities:  extremities normal, atraumatic, no cyanosis or edema and 2+ right posterior tibial pulse  EKG not performed today  ASSESSMENT AND PLAN:   Sleep apnea History of obstructive sleep apnea on CPAP which he benefits from   Peripheral arterial disease History of peripheral arterial disease with critical limb ischemia status post percutaneous intervention of an occluded posterior tibial artery on 2 occasions in the past most recently 10/31/14 for an ulcer on the  Heel of his right foot. His Dopplers performed one week later showed a normal ABI with a widely patent posterior tibial and is also slowly healing. He goes to the wound care center at [facility].   Hypertension History of hypertension blood pressure measured at 114/62. He is on amlodipine. Continue current meds at current dosing   Hyperlipidemia History of hyperlipidemia on diet and 40 mg a day. His most recent lipid profile performed 11/26/14 revealed a total cholesterol 162, LDL 95 and HDL of 42.   Carotid stenosis History of coronary artery disease status post  stroke in the past and subsequent carotid endarterectomy on the left performed by Dr. [name] .  He is followed by Doppler study in Dr. Stephens Shire office.       [name] MD FACP,FACC,FAHA, [facility] 02/04/2015 8:34 AM

## 2015-02-04 NOTE — Assessment & Plan Note (Signed)
History of peripheral arterial disease with critical limb ischemia status post percutaneous intervention of an occluded posterior tibial artery on 2 occasions in the past most recently 10/31/14 for an ulcer on the  Heel of his right foot. His Dopplers performed one week later showed a normal ABI with a widely patent posterior tibial and is also slowly healing. He goes to the wound care center at [facility].

## 2015-02-04 NOTE — Assessment & Plan Note (Signed)
History of hypertension blood pressure measured at 114/62. He is on amlodipine. Continue current meds at current dosing

## 2015-02-04 NOTE — Patient Instructions (Signed)
Please have you lower extremity arterial doppler in August.  Dr Gwenlyn Found recommends that you schedule a follow-up appointment in 6 months with an extender.  Dr Gwenlyn Found wants you to follow-up in 1 year.  You will receive a reminder letter in the mail two months in advance. If you don't receive a letter, please call our office to schedule the follow-up appointment.

## 2015-02-06 ENCOUNTER — Ambulatory Visit (HOSPITAL_COMMUNITY): Payer: PPO | Admitting: Physical Therapy

## 2015-02-06 DIAGNOSIS — M6289 Other specified disorders of muscle: Secondary | ICD-10-CM

## 2015-02-06 DIAGNOSIS — S91102D Unspecified open wound of left great toe without damage to nail, subsequent encounter: Secondary | ICD-10-CM | POA: Diagnosis not present

## 2015-02-06 DIAGNOSIS — T148XXA Other injury of unspecified body region, initial encounter: Secondary | ICD-10-CM

## 2015-02-06 NOTE — Therapy (Signed)
[place], [place], [address] Phone: [phone]   Fax:  [phone]  Wound Care Therapy  Patient Details  Name: [name] MRN: [id number] Date of Birth: 08/06/1946 Referring Provider:  [name], MD  Encounter Date: 02/06/2015      PT End of Session - 02/06/15 1149    Visit Number 23   Number of Visits 28   Date for PT Re-Evaluation 03/15/15   Authorization Type medicare   Authorization Time Period G-code done 18th visit    Authorization - Visit Number 23   Authorization - Number of Visits 28   PT Start Time 0800   PT Stop Time 0850   PT Time Calculation (min) 50 min   Activity Tolerance Patient tolerated treatment well   Behavior During Therapy [facility] for tasks assessed/performed      Past Medical History  Diagnosis Date  . Hypertension   . Diabetes mellitus   . Carotid artery occlusion   . Stroke March 08, 2012  . Hyperlipidemia   . Peripheral arterial disease     nonhealing ulcers bilaterally on each great toe  . PONV (postoperative nausea and vomiting)   . Arthritis   . Hypothyroidism   . Shortness of breath   . Obstructive sleep apnea     Past Surgical History  Procedure Laterality Date  . Back surgery    . Cervical fusion    . Foot surgery    . Spine surgery    . Eye surgery    . Endarterectomy Left 11/29/2013    Procedure: ENDARTERECTOMY CAROTID;  Surgeon: Serafina Mitchell, MD;  Location: [facility] OR;  Service: Vascular;  Laterality: Left;  . Carotid endarterectomy Left   . Pv angiogram  02/18/2014    tibial vessel diseas bil.  . Angioplasty  02/28/14    diamond back orbital rotational atherectomy of Rt. tibial  . Lower ext duplex doppler  03/14/14    Rt ABI 1.2  . Lower extremity angiogram Bilateral 02/18/2014    Procedure: LOWER EXTREMITY ANGIOGRAM;  Surgeon: [name], MD;  Location: [facility] CATH LAB;  Service: Cardiovascular;  Laterality: Bilateral;  . Lower extremity angiogram  N/A 10/31/2014    Procedure: LOWER EXTREMITY ANGIOGRAM;  Surgeon: [name], MD;  Location: [facility] CATH LAB;  Service: Cardiovascular;  Laterality: N/A;    There were no vitals filed for this visit.  Visit Diagnosis:  Nonhealing nonsurgical wound with necrosis of muscle      Subjective Assessment - 02/06/15 1137    Subjective Having significant pain in his R foot, partially due to arthritis and partially due to callus that has formed on lateral surface of R third toe and is causing a sore on medial 4th toe   Currently in Pain? Yes   Pain Score 4    Pain Location Toe (Comment which one)   Pain Orientation Right                   Wound Therapy - 02/06/15 1143    Subjective Pain in R toes today, paritally due to arthritis and partially due to callus on lateral 3rd toe rubbing on medial 4th toe; states he has to change his dressings every day    Patient and Family Stated Goals wounds to heal    Prior Treatments podiatrists self care.   Pain Assessment 0-10   Pain Score 4    Pain Location Toe (Comment which one)  Pain Orientation Right   Wound Properties Date First Assessed: 03/14/14 Time First Assessed: 1930 Wound Type: Diabetic ulcer Location: Other (Comment) , r big toe  Location Orientation: Right Wound Description (Comments): nonhealing wound on planar aspct of MTP of great toe  Present on Admission: Yes   Dressing Type Hydrogel;Gauze (Comment);Other (Comment)  hydrogel; vasoline on callous; gauze/medipore tape    Dressing Changed Changed   Dressing Status Clean;Old drainage   Dressing Change Frequency PRN   Site / Wound Assessment Red   % Wound base Red or Granulating 100%   % Wound base Yellow 0%   Peri-wound Assessment Other (Comment)  surrounded by callous    Margins Epibole (rolled edges)   Drainage Amount Scant   Drainage Description Serosanguineous   Treatment Cleansed;Debridement (Selective);Other (Comment)  shaved callous    Selective Debridement -  Location callous around wound   Selective Debridement - Tools Used Scalpel;Forceps   Selective Debridement - Tissue Removed callous surrounding wound   Wound Therapy - Clinical Statement Continued to address callous around wound on R foot today; shaved callous with focus on superior edge to reduce undermining. Patient requested a cover for the callous between his R 3rd/4th toes since sores were forming; placed bandaid over the side of each toe to reduce tissue damage and encouraged patient to see foot MD regarding this wound- patient states that he already has appointment with MD regarding this area between his toes.    Wound Therapy - Functional Problem List difficulty walking   Factors Delaying/Impairing Wound Healing Altered sensation;Diabetes Mellitus;Infection - systemic/local;Multiple medical problems;Vascular compromise   Hydrotherapy Plan Debridement;Dressing change   Wound Therapy - Frequency --  reduce to 1x week and continue for 6 more weeks.    Wound Therapy - Current Recommendations PT   Wound Plan Debridement of callous area.  Encourage pt to were current diabetic shoe and to stay off of wounds as much as possible.   Dressing  medihoney, vaselinge,Gauze, 4x4 and kling followed by netting   Decrease Necrotic Tissue to STG: 4 weeks: 0%   Increase Granulation Tissue to STG: 4 weeks 100%   Decrease Length/Width/Depth by (cm) STG: decrease Lt to have no tunnelling ; Rt to be decresed to 1.0x .75 x .1 STG for 4 weeks;  LTG healed 8 weeks   Patient/Family will be able to  I in HEP to strengthen DF by 1  grade 8 weeks    Additional Wound Therapy Goal Pt to state it is easier to walk 8 weeks    Wound Therapy - Potential for Goals Good                 PT Education - 02/06/15 1148    Education provided Yes   Education Details education to go to foot MD regarding sores between 3rd/4th toes R foot    Person(s) Educated Patient   Methods Explanation   Comprehension Verbalized  understanding                     Problem List Patient Active Problem List   Diagnosis Date Noted  . Nonhealing skin ulcer 10/28/2014  . PAD (peripheral artery disease) 10/28/2014  . Bilateral carotid artery disease 07/08/2014  . Critical lower limb ischemia 02/28/2014  . Obstructive sleep apnea 02/12/2014  . Aftercare following surgery of the circulatory system, [place] 12/04/2013  . Carotid stenosis 11/29/2013  . Restless leg syndrome 10/23/2013  . Sleep apnea 10/23/2013  . Obesity (BMI 30.0-34.9) 10/23/2013  .  Hyperlipidemia 07/16/2013  . Peripheral arterial disease 07/16/2013  . Occlusion and stenosis of carotid artery without mention of cerebral infarction 04/24/2012  . History of stroke June 2013 03/14/2012  . Hypertension 03/14/2012  . Type 2 diabetes mellitus with circulatory disorder 03/14/2012  . Peripheral neuropathy 03/14/2012  . Cardiomyopathy- EF NL 5/14 03/14/2012  . ARTHRITIS, RIGHT FOOT 06/26/2008    Deniece Ree PT, DPT [facility] [address] [place], [place], [address] Phone: [phone]   Fax:  [phone]

## 2015-02-06 NOTE — Therapy (Deleted)
[name], [place], [address] Phone: [phone]   Fax:  [phone]  Physical Therapy Treatment  Patient Details  Name: [name] MRN: [id number] Date of Birth: 08/16/1946 Referring Provider:  [name], MD  Encounter Date: 02/06/2015      PT End of Session - 02/06/15 1149    Visit Number 23   Number of Visits 28   Date for PT Re-Evaluation 03/15/15   Authorization Type medicare   Authorization Time Period G-code done 18th visit    Authorization - Visit Number 23   Authorization - Number of Visits 28   PT Start Time 0800   PT Stop Time 0850   PT Time Calculation (min) 50 min   Activity Tolerance Patient tolerated treatment well   Behavior During Therapy [facility] for tasks assessed/performed      Past Medical History  Diagnosis Date  . Hypertension   . Diabetes mellitus   . Carotid artery occlusion   . Stroke March 08, 2012  . Hyperlipidemia   . Peripheral arterial disease     nonhealing ulcers bilaterally on each great toe  . PONV (postoperative nausea and vomiting)   . Arthritis   . Hypothyroidism   . Shortness of breath   . Obstructive sleep apnea     Past Surgical History  Procedure Laterality Date  . Back surgery    . Cervical fusion    . Foot surgery    . Spine surgery    . Eye surgery    . Endarterectomy Left 11/29/2013    Procedure: ENDARTERECTOMY CAROTID;  Surgeon: Serafina Mitchell, MD;  Location: [facility] OR;  Service: Vascular;  Laterality: Left;  . Carotid endarterectomy Left   . Pv angiogram  02/18/2014    tibial vessel diseas bil.  . Angioplasty  02/28/14    diamond back orbital rotational atherectomy of Rt. tibial  . Lower ext duplex doppler  03/14/14    Rt ABI 1.2  . Lower extremity angiogram Bilateral 02/18/2014    Procedure: LOWER EXTREMITY ANGIOGRAM;  Surgeon: [name], MD;  Location: [facility] CATH LAB;  Service: Cardiovascular;  Laterality: Bilateral;  . Lower extremity  angiogram N/A 10/31/2014    Procedure: LOWER EXTREMITY ANGIOGRAM;  Surgeon: [name], MD;  Location: [facility] CATH LAB;  Service: Cardiovascular;  Laterality: N/A;    There were no vitals filed for this visit.  Visit Diagnosis:  Nonhealing nonsurgical wound with necrosis of muscle      Subjective Assessment - 02/06/15 1137    Subjective Having significant pain in his R foot, partially due to arthritis and partially due to callus that has formed on lateral surface of R third toe and is causing a sore on medial 4th toe   Currently in Pain? Yes   Pain Score 4    Pain Location Toe (Comment which one)   Pain Orientation Right                       Wound Therapy - 02/06/15 1143    Subjective Pain in R toes today, paritally due to arthritis and partially due to callus on lateral 3rd toe rubbing on medial 4th toe; states he has to change his dressings every day    Patient and Family Stated Goals wounds to heal    Prior Treatments podiatrists self care.   Pain Assessment 0-10   Pain Score 4    Pain Location Toe (  Comment which one)   Pain Orientation Right   Wound Properties Date First Assessed: 03/14/14 Time First Assessed: 1930 Wound Type: Diabetic ulcer Location: Other (Comment) , r big toe  Location Orientation: Right Wound Description (Comments): nonhealing wound on planar aspct of MTP of great toe  Present on Admission: Yes   Dressing Type Hydrogel;Gauze (Comment);Other (Comment)  hydrogel; vasoline on callous; gauze/medipore tape    Dressing Changed Changed   Dressing Status Clean;Old drainage   Dressing Change Frequency PRN   Site / Wound Assessment Red   % Wound base Red or Granulating 100%   % Wound base Yellow 0%   Peri-wound Assessment Other (Comment)  surrounded by callous    Margins Epibole (rolled edges)   Drainage Amount Scant   Drainage Description Serosanguineous   Treatment Cleansed;Debridement (Selective);Other (Comment)  shaved callous     Selective Debridement - Location callous around wound   Selective Debridement - Tools Used Scalpel;Forceps   Selective Debridement - Tissue Removed callous surrounding wound   Wound Therapy - Clinical Statement Continued to address callous around wound on R foot today; shaved callous with focus on superior edge to reduce undermining. Patient requested a cover for the callous between his R 3rd/4th toes since sores were forming; placed bandaid over the side of each toe to reduce tissue damage and encouraged patient to see foot MD regarding this wound- patient states that he already has appointment with MD regarding this area between his toes.    Wound Therapy - Functional Problem List difficulty walking   Factors Delaying/Impairing Wound Healing Altered sensation;Diabetes Mellitus;Infection - systemic/local;Multiple medical problems;Vascular compromise   Hydrotherapy Plan Debridement;Dressing change   Wound Therapy - Frequency --  reduce to 1x week and continue for 6 more weeks.    Wound Therapy - Current Recommendations PT   Wound Plan Debridement of callous area.  Encourage pt to were current diabetic shoe and to stay off of wounds as much as possible.   Dressing  medihoney, vaselinge,Gauze, 4x4 and kling followed by netting   Decrease Necrotic Tissue to STG: 4 weeks: 0%   Increase Granulation Tissue to STG: 4 weeks 100%   Decrease Length/Width/Depth by (cm) STG: decrease Lt to have no tunnelling ; Rt to be decresed to 1.0x .75 x .1 STG for 4 weeks;  LTG healed 8 weeks   Patient/Family will be able to  I in HEP to strengthen DF by 1  grade 8 weeks    Additional Wound Therapy Goal Pt to state it is easier to walk 8 weeks    Wound Therapy - Potential for Goals Good                  PT Education - 02/06/15 1148    Education provided Yes   Education Details education to go to foot MD regarding sores between 3rd/4th toes R foot    Person(s) Educated Patient   Methods Explanation    Comprehension Verbalized understanding                    Problem List Patient Active Problem List   Diagnosis Date Noted  . Nonhealing skin ulcer 10/28/2014  . PAD (peripheral artery disease) 10/28/2014  . Bilateral carotid artery disease 07/08/2014  . Critical lower limb ischemia 02/28/2014  . Obstructive sleep apnea 02/12/2014  . Aftercare following surgery of the circulatory system, [place] 12/04/2013  . Carotid stenosis 11/29/2013  . Restless leg syndrome 10/23/2013  . Sleep apnea 10/23/2013  .  Obesity (BMI 30.0-34.9) 10/23/2013  . Hyperlipidemia 07/16/2013  . Peripheral arterial disease 07/16/2013  . Occlusion and stenosis of carotid artery without mention of cerebral infarction 04/24/2012  . History of stroke June 2013 03/14/2012  . Hypertension 03/14/2012  . Type 2 diabetes mellitus with circulatory disorder 03/14/2012  . Peripheral neuropathy 03/14/2012  . Cardiomyopathy- EF NL 5/14 03/14/2012  . ARTHRITIS, RIGHT FOOT 06/26/2008    Deniece Ree E 02/06/2015, 11:50 AM  [place], [place], [address] Phone: [phone]   Fax:  [phone]

## 2015-02-11 ENCOUNTER — Ambulatory Visit (HOSPITAL_COMMUNITY): Payer: PPO | Admitting: Physical Therapy

## 2015-02-11 DIAGNOSIS — M6289 Other specified disorders of muscle: Secondary | ICD-10-CM

## 2015-02-11 DIAGNOSIS — S91102D Unspecified open wound of left great toe without damage to nail, subsequent encounter: Secondary | ICD-10-CM | POA: Diagnosis not present

## 2015-02-11 DIAGNOSIS — T148XXA Other injury of unspecified body region, initial encounter: Secondary | ICD-10-CM

## 2015-02-11 NOTE — Therapy (Signed)
[name], [place], [address] Phone: [phone]   Fax:  [phone]  Physical Therapy Treatment  Patient Details  Name: [name] MRN: [id number] Date of Birth: Jun 16, 1946 Referring Provider:  [name], MD  Encounter Date: 02/11/2015      PT End of Session - 02/11/15 1051    Visit Number 24   Number of Visits 28   Date for PT Re-Evaluation 03/15/15   Authorization Type medicare   Authorization Time Period G-code done 18th visit    Authorization - Visit Number 24   Authorization - Number of Visits 28   PT Start Time 0800   PT Stop Time 0843   PT Time Calculation (min) 43 min   Activity Tolerance Patient tolerated treatment well   Behavior During Therapy [facility] for tasks assessed/performed      Past Medical History  Diagnosis Date  . Hypertension   . Diabetes mellitus   . Carotid artery occlusion   . Stroke March 08, 2012  . Hyperlipidemia   . Peripheral arterial disease     nonhealing ulcers bilaterally on each great toe  . PONV (postoperative nausea and vomiting)   . Arthritis   . Hypothyroidism   . Shortness of breath   . Obstructive sleep apnea     Past Surgical History  Procedure Laterality Date  . Back surgery    . Cervical fusion    . Foot surgery    . Spine surgery    . Eye surgery    . Endarterectomy Left 11/29/2013    Procedure: ENDARTERECTOMY CAROTID;  Surgeon: Serafina Mitchell, MD;  Location: [facility] OR;  Service: Vascular;  Laterality: Left;  . Carotid endarterectomy Left   . Pv angiogram  02/18/2014    tibial vessel diseas bil.  . Angioplasty  02/28/14    diamond back orbital rotational atherectomy of Rt. tibial  . Lower ext duplex doppler  03/14/14    Rt ABI 1.2  . Lower extremity angiogram Bilateral 02/18/2014    Procedure: LOWER EXTREMITY ANGIOGRAM;  Surgeon: [name], MD;  Location: [facility] CATH LAB;  Service: Cardiovascular;  Laterality: Bilateral;  . Lower extremity  angiogram N/A 10/31/2014    Procedure: LOWER EXTREMITY ANGIOGRAM;  Surgeon: [name], MD;  Location: [facility] CATH LAB;  Service: Cardiovascular;  Laterality: N/A;    There were no vitals filed for this visit.  Visit Diagnosis:  Nonhealing nonsurgical wound with necrosis of muscle                     Wound Therapy - 02/11/15 1045    Subjective Patient states he is feeling better after the flu but continuing to have pain in between his toes, concerned that it might be getting infected    Patient and Family Stated Goals wounds to heal    Prior Treatments podiatrists self care.   Pain Assessment 0-10   Pain Score 3    Pain Location Toe (Comment which one)   Pain Orientation Right   Wound Properties Date First Assessed: 03/14/14 Time First Assessed: 1930 Wound Type: Diabetic ulcer Location: Other (Comment) , r big toe  Location Orientation: Right Wound Description (Comments): nonhealing wound on planar aspct of MTP of great toe  Present on Admission: Yes   Dressing Type Hydrogel;Gauze (Comment);Other (Comment)  hydrogel on wound; vasoline on callous; medipore tape    Dressing Status Clean;Old drainage   Dressing Change Frequency PRN  Site / Wound Assessment Red   % Wound base Red or Granulating 100%   % Wound base Yellow 0%   Peri-wound Assessment Other (Comment)  callous   Margins Epibole (rolled edges)   Drainage Amount Scant   Drainage Description Serosanguineous   Treatment Debridement (Selective);Cleansed;Other (Comment)  shaved callous especially around undermining border    Selective Debridement - Location callous around wound   Selective Debridement - Tools Used Scalpel;Forceps   Selective Debridement - Tissue Removed callous surrounding wound   Wound Therapy - Clinical Statement Contineud to address callous around wound, shaving edges and attempting to reduce undermining on suprerior border. Inspected area between patient's R 3rd and 4th toes due to  complaints of ongoing pain; recommended that patient go see MD asap due to suspicious nature of this inter-toe wound.    Wound Therapy - Functional Problem List difficulty walking   Factors Delaying/Impairing Wound Healing Altered sensation;Diabetes Mellitus;Infection - systemic/local;Multiple medical problems;Vascular compromise   Hydrotherapy Plan Debridement;Dressing change   Wound Therapy - Frequency Other (comment)   Wound Therapy - Current Recommendations PT  x1/xweek for next 5 weeks    Wound Plan Debridement of callous area.  Encourage pt to were current diabetic shoe and to stay off of wounds as much as possible.   Decrease Necrotic Tissue to STG: 4 weeks: 0%   Increase Granulation Tissue to STG: 4 weeks 100%   Decrease Length/Width/Depth by (cm) STG: decrease Lt to have no tunnelling ; Rt to be decresed to 1.0x .75 x .1 STG for 4 weeks;  LTG healed 8 weeks   Patient/Family will be able to  I in HEP to strengthen DF by 1  grade 8 weeks    Additional Wound Therapy Goal Pt to state it is easier to walk 8 weeks    Wound Therapy - Potential for Goals Good                  PT Education - 02/11/15 1050    Education provided Yes   Education Details educated to go to foot MD asap regarding sores between 3rd/4th toes R foot    Person(s) Educated Patient   Methods Explanation   Comprehension Verbalized understanding                    Problem List Patient Active Problem List   Diagnosis Date Noted  . Nonhealing skin ulcer 10/28/2014  . PAD (peripheral artery disease) 10/28/2014  . Bilateral carotid artery disease 07/08/2014  . Critical lower limb ischemia 02/28/2014  . Obstructive sleep apnea 02/12/2014  . Aftercare following surgery of the circulatory system, [place] 12/04/2013  . Carotid stenosis 11/29/2013  . Restless leg syndrome 10/23/2013  . Sleep apnea 10/23/2013  . Obesity (BMI 30.0-34.9) 10/23/2013  . Hyperlipidemia 07/16/2013  . Peripheral arterial  disease 07/16/2013  . Occlusion and stenosis of carotid artery without mention of cerebral infarction 04/24/2012  . History of stroke June 2013 03/14/2012  . Hypertension 03/14/2012  . Type 2 diabetes mellitus with circulatory disorder 03/14/2012  . Peripheral neuropathy 03/14/2012  . Cardiomyopathy- EF NL 5/14 03/14/2012  . ARTHRITIS, RIGHT FOOT 06/26/2008    Deniece Ree PT, DPT [place] [address] [place], [place], [address] Phone: [phone]   Fax:  [phone]

## 2015-02-13 ENCOUNTER — Ambulatory Visit (HOSPITAL_COMMUNITY): Payer: Medicare Other

## 2015-02-18 ENCOUNTER — Ambulatory Visit (HOSPITAL_COMMUNITY): Payer: PPO

## 2015-02-18 DIAGNOSIS — T148XXA Other injury of unspecified body region, initial encounter: Secondary | ICD-10-CM

## 2015-02-18 DIAGNOSIS — S91102D Unspecified open wound of left great toe without damage to nail, subsequent encounter: Secondary | ICD-10-CM | POA: Diagnosis not present

## 2015-02-18 DIAGNOSIS — M6289 Other specified disorders of muscle: Secondary | ICD-10-CM

## 2015-02-18 NOTE — Therapy (Addendum)
[place], [place], [address] Phone: [phone]   Fax:  [phone]  Wound Care Therapy  Patient Details  Name: [name] MRN: [id number] Date of Birth: [birth year] Referring Provider:  [name], MD  Encounter Date: 02/18/2015      PT End of Session - 02/18/15 1323    Visit Number 25   Number of Visits 28   Date for PT Re-Evaluation 03/15/15   Authorization Type medicare   Authorization Time Period G-code done 18th visit    Authorization - Visit Number 25   Authorization - Number of Visits 28   PT Start Time 0810   PT Stop Time 0840   PT Time Calculation (min) 30 min   Activity Tolerance Patient tolerated treatment well   Behavior During Therapy [facility] for tasks assessed/performed      Past Medical History  Diagnosis Date  . Hypertension   . Diabetes mellitus   . Carotid artery occlusion   . Stroke March 08, 2012  . Hyperlipidemia   . Peripheral arterial disease     nonhealing ulcers bilaterally on each great toe  . PONV (postoperative nausea and vomiting)   . Arthritis   . Hypothyroidism   . Shortness of breath   . Obstructive sleep apnea     Past Surgical History  Procedure Laterality Date  . Back surgery    . Cervical fusion    . Foot surgery    . Spine surgery    . Eye surgery    . Endarterectomy Left 11/29/2013    Procedure: ENDARTERECTOMY CAROTID;  Surgeon: Serafina Mitchell, MD;  Location: [facility] OR;  Service: Vascular;  Laterality: Left;  . Carotid endarterectomy Left   . Pv angiogram  02/18/2014    tibial vessel diseas bil.  . Angioplasty  02/28/14    diamond back orbital rotational atherectomy of Rt. tibial  . Lower ext duplex doppler  03/14/14    Rt ABI 1.2  . Lower extremity angiogram Bilateral 02/18/2014    Procedure: LOWER EXTREMITY ANGIOGRAM;  Surgeon: [name], MD;  Location: [facility] CATH LAB;  Service: Cardiovascular;  Laterality: Bilateral;  . Lower extremity angiogram  N/A 10/31/2014    Procedure: LOWER EXTREMITY ANGIOGRAM;  Surgeon: [name], MD;  Location: [facility] CATH LAB;  Service: Cardiovascular;  Laterality: N/A;    There were no vitals filed for this visit.  Visit Diagnosis:  Nonhealing nonsurgical wound with necrosis of muscle      Subjective Assessment - 02/18/15 0852    Subjective Pt 10 min late for apt today and entered dept with increased difficulty walking, stated pain scale 10/10 for Lt great toe and Rt foot.  Pt cold at entrance and coughing   Currently in Pain? Yes   Pain Score 10-Worst pain ever   Pain Location Foot   Pain Orientation Right;Left  Rt Foot and Lt great toe                   Wound Therapy - 02/18/15 0852    Subjective Pt 10 min late for apt today and entered dept with increased difficulty walking, stated pain scale 10/10 for Lt great toe and Rt foot.  Pt cold at entrance and coughing   Patient and Family Stated Goals wounds to heal    Prior Treatments podiatrists self care.   Wound Properties Date First Assessed: 03/14/14 Time First Assessed: 1930 Wound Type: Diabetic ulcer Location: Other (Comment) ,  r big toe  Location Orientation: Right Wound Description (Comments): nonhealing wound on planar aspct of MTP of great toe  Present on Admission: Yes   Dressing Type --  Medihoney, gauze and medipore tape   Dressing Changed Changed   Dressing Status Clean;Old drainage   Dressing Change Frequency PRN   Site / Wound Assessment Red   % Wound base Red or Granulating 100%   % Wound base Yellow 0%   Peri-wound Assessment Other (Comment)  callous surrounding wound, proximal bruising   Wound Length (cm) 1.5 cm   Wound Width (cm) 1.2 cm   Wound Depth (cm) 0.3 cm   Undermining (cm) undermining remains superior edge   Margins Epibole (rolled edges)   Drainage Amount Scant   Drainage Description Serosanguineous   Treatment Cleansed;Debridement (Selective)  Shaved callous surrounding wound   Selective  Debridement - Location callous around wound   Selective Debridement - Tools Used Scalpel;Forceps   Selective Debridement - Tissue Removed callous surrounding wound   Wound Therapy - Clinical Statement Pt limited by pain this session with pain on Lt great toe and all of Rt foot.  Pt also cold this session, requested blanket with reports of cold chills.  Noted new bruising on plantar surface of Rt foot, redness surround wound and inflammed Rt great toe.  Wound continues between 3rd and 4th toe, no debridement complete to new wound.  Pt encouraged to make MD apt today or go to ER if unable to be seen.  Resumed medihoney dressings on wound Rt plantar surface with gauze and medipore tape.  Removed callous of Lt great toe to examine any infection there, no findings.     Factors Delaying/Impairing Wound Healing Altered sensation;Diabetes Mellitus;Infection - systemic/local;Multiple medical problems;Vascular compromise   Hydrotherapy Plan Debridement;Dressing change   Wound Therapy - Frequency Other (comment)   Wound Therapy - Current Recommendations PT  1x weekly   Wound Plan F/U with MD apt with new wounds.  Debridement of callous area.  Encourage pt to were current diabetic shoe and to stay off of wounds as much as possible.   Dressing  Medihoney, gauze and medipore taping                 Problem List Patient Active Problem List   Diagnosis Date Noted  . Nonhealing skin ulcer 10/28/2014  . PAD (peripheral artery disease) 10/28/2014  . Bilateral carotid artery disease 07/08/2014  . Critical lower limb ischemia 02/28/2014  . Obstructive sleep apnea 02/12/2014  . Aftercare following surgery of the circulatory system, [place] 12/04/2013  . Carotid stenosis 11/29/2013  . Restless leg syndrome 10/23/2013  . Sleep apnea 10/23/2013  . Obesity (BMI 30.0-34.9) 10/23/2013  . Hyperlipidemia 07/16/2013  . Peripheral arterial disease 07/16/2013  . Occlusion and stenosis of carotid artery without  mention of cerebral infarction 04/24/2012  . History of stroke June 2013 03/14/2012  . Hypertension 03/14/2012  . Type 2 diabetes mellitus with circulatory disorder 03/14/2012  . Peripheral neuropathy 03/14/2012  . Cardiomyopathy- EF NL 5/14 03/14/2012  . ARTHRITIS, RIGHT FOOT 06/26/2008   [name], PTA  [name] 02/18/2015, 1:58 PM  [place] [address] [place], [place], [address] Phone: [phone]   Fax:  [phone]

## 2015-02-20 ENCOUNTER — Ambulatory Visit (HOSPITAL_COMMUNITY): Payer: PPO | Attending: Podiatry | Admitting: Physical Therapy

## 2015-02-20 ENCOUNTER — Telehealth (HOSPITAL_COMMUNITY): Payer: Self-pay | Admitting: Physical Therapy

## 2015-02-20 DIAGNOSIS — E119 Type 2 diabetes mellitus without complications: Secondary | ICD-10-CM | POA: Insufficient documentation

## 2015-02-20 DIAGNOSIS — I10 Essential (primary) hypertension: Secondary | ICD-10-CM | POA: Insufficient documentation

## 2015-02-20 DIAGNOSIS — S91102D Unspecified open wound of left great toe without damage to nail, subsequent encounter: Secondary | ICD-10-CM | POA: Insufficient documentation

## 2015-02-20 NOTE — Telephone Encounter (Signed)
Patient returned clinic's call from earlier this morning, states that he did not come to his appointment this morning because his schedule says that his next appointment is not until next week- he did not know he was scheduled to be seen today. He reports that he did go to his MD, who has prescribed antibiotics for his wound. Patient reports that he is feeling much better than he was on last visit. Reminded patient about time and date of next visit with skilled PT services.   Deniece Ree PT, DPT [phone]

## 2015-02-25 ENCOUNTER — Ambulatory Visit (HOSPITAL_COMMUNITY): Payer: PPO | Admitting: Physical Therapy

## 2015-02-25 DIAGNOSIS — E119 Type 2 diabetes mellitus without complications: Secondary | ICD-10-CM | POA: Diagnosis not present

## 2015-02-25 DIAGNOSIS — M6289 Other specified disorders of muscle: Secondary | ICD-10-CM

## 2015-02-25 DIAGNOSIS — S91102D Unspecified open wound of left great toe without damage to nail, subsequent encounter: Secondary | ICD-10-CM | POA: Diagnosis not present

## 2015-02-25 DIAGNOSIS — I10 Essential (primary) hypertension: Secondary | ICD-10-CM | POA: Diagnosis not present

## 2015-02-25 DIAGNOSIS — T148XXA Other injury of unspecified body region, initial encounter: Secondary | ICD-10-CM

## 2015-02-25 NOTE — Therapy (Signed)
[place], [place], [address] Phone: [phone]   Fax:  [phone]  Wound Care Therapy  Patient Details  Name: [name] MRN: [id number] Date of Birth: 11/04/1945 Referring Provider:  [name], MD  Encounter Date: 02/25/2015      PT End of Session - 02/25/15 1315    Visit Number 26   Number of Visits 28   Date for PT Re-Evaluation 03/15/15   Authorization Type medicare   Authorization Time Period G-code done 18th visit    Authorization - Visit Number 26   Authorization - Number of Visits 28   PT Start Time 0800   PT Stop Time 0845   PT Time Calculation (min) 45 min   Activity Tolerance Patient tolerated treatment well   Behavior During Therapy [facility] for tasks assessed/performed      Past Medical History  Diagnosis Date  . Hypertension   . Diabetes mellitus   . Carotid artery occlusion   . Stroke March 08, 2012  . Hyperlipidemia   . Peripheral arterial disease     nonhealing ulcers bilaterally on each great toe  . PONV (postoperative nausea and vomiting)   . Arthritis   . Hypothyroidism   . Shortness of breath   . Obstructive sleep apnea     Past Surgical History  Procedure Laterality Date  . Back surgery    . Cervical fusion    . Foot surgery    . Spine surgery    . Eye surgery    . Endarterectomy Left 11/29/2013    Procedure: ENDARTERECTOMY CAROTID;  Surgeon: Serafina Mitchell, MD;  Location: [facility] OR;  Service: Vascular;  Laterality: Left;  . Carotid endarterectomy Left   . Pv angiogram  02/18/2014    tibial vessel diseas bil.  . Angioplasty  02/28/14    diamond back orbital rotational atherectomy of Rt. tibial  . Lower ext duplex doppler  03/14/14    Rt ABI 1.2  . Lower extremity angiogram Bilateral 02/18/2014    Procedure: LOWER EXTREMITY ANGIOGRAM;  Surgeon: [name], MD;  Location: [facility] CATH LAB;  Service: Cardiovascular;  Laterality: Bilateral;  . Lower extremity angiogram  N/A 10/31/2014    Procedure: LOWER EXTREMITY ANGIOGRAM;  Surgeon: [name], MD;  Location: [facility] CATH LAB;  Service: Cardiovascular;  Laterality: N/A;    There were no vitals filed for this visit.  Visit Diagnosis:  Nonhealing nonsurgical wound with necrosis of muscle                 Wound Therapy - 02/25/15 1302    Subjective Pt states he had bronchitis and was treated with new antibiotic that would take care of it and his feet.  States he feels much better today.   Patient and Family Stated Goals wounds to heal    Prior Treatments podiatrists self care.   Pain Score 2    Pain Location Foot   Pain Orientation Right;Left   Wound Properties Date First Assessed: 03/14/14 Time First Assessed: 1930 Wound Type: Diabetic ulcer Location: Other (Comment) , r big toe  Location Orientation: Right Wound Description (Comments): nonhealing wound on planar aspct of MTP of great toe  Present on Admission: Yes   Dressing Type --  Medihoney, gauze and medipore tape   Dressing Changed Changed   Dressing Status Clean;Old drainage   Dressing Change Frequency PRN   Site / Wound Assessment Red   % Wound base Red  or Granulating 100%   % Wound base Yellow 0%   Peri-wound Assessment Other (Comment)  callous surrounding wound, proximal bruising   Margins Epibole (rolled edges)   Drainage Amount Scant   Drainage Description Serosanguineous   Treatment Cleansed;Debridement (Selective)   Wound Properties Date First Assessed: 11/14/14 Time First Assessed: 1000 Wound Type: Diabetic ulcer Location: Toe (Comment  which one) Location Orientation: Left , Great Toe  Wound Description (Comments): Plantar aspect of Lt great toe  , tunneling noted  Present on Admission: Yes Final Assessment Date: 01/16/15   Dressing Type --  medihoney   Dressing Changed New   Site / Wound Assessment Clean;Dry   % Wound base Red or Granulating 80%   % Wound base Yellow 20%   Wound Length (cm) 0.3 cm   Wound Width (cm)  0.2 cm   Wound Depth (cm) 0.5 cm   Margins Unattached edges (unapproximated)   Selective Debridement - Location callous around wound   Selective Debridement - Tools Used Scalpel;Forceps   Selective Debridement - Tissue Removed callous surrounding wound   Wound Therapy - Clinical Statement Able to remove a large amount of callous from plantar aspect of  MTP and up in between great toe and second toe.  Overall improvment noted without redness perimeter or swelling.  Also noted wound on medial aspect of Lt great toe has not yet fully healed.  Debrided callous and packed with medihoney gauze.     Factors Delaying/Impairing Wound Healing Altered sensation;Diabetes Mellitus;Infection - systemic/local;Multiple medical problems;Vascular compromise   Hydrotherapy Plan Debridement;Dressing change   Wound Therapy - Frequency Other (comment)   Wound Therapy - Current Recommendations PT  1x weekly   Wound Plan Continue woundcare.  Pt to get toenails trimmed on 6/22.   Dressing  Medihoney, gauze and medipore taping               Problem List Patient Active Problem List   Diagnosis Date Noted  . Nonhealing skin ulcer 10/28/2014  . PAD (peripheral artery disease) 10/28/2014  . Bilateral carotid artery disease 07/08/2014  . Critical lower limb ischemia 02/28/2014  . Obstructive sleep apnea 02/12/2014  . Aftercare following surgery of the circulatory system, [place] 12/04/2013  . Carotid stenosis 11/29/2013  . Restless leg syndrome 10/23/2013  . Sleep apnea 10/23/2013  . Obesity (BMI 30.0-34.9) 10/23/2013  . Hyperlipidemia 07/16/2013  . Peripheral arterial disease 07/16/2013  . Occlusion and stenosis of carotid artery without mention of cerebral infarction 04/24/2012  . History of stroke June 2013 03/14/2012  . Hypertension 03/14/2012  . Type 2 diabetes mellitus with circulatory disorder 03/14/2012  . Peripheral neuropathy 03/14/2012  . Cardiomyopathy- EF NL 5/14 03/14/2012  . ARTHRITIS,  RIGHT FOOT 06/26/2008    Teena Irani, PTA/CLT [phone]  02/25/2015, 1:17 PM  [place] [address] [place], [place], [address] Phone: [phone]   Fax:  [phone]

## 2015-02-27 ENCOUNTER — Encounter (HOSPITAL_COMMUNITY): Payer: Medicare Other | Admitting: Physical Therapy

## 2015-03-03 ENCOUNTER — Ambulatory Visit (HOSPITAL_COMMUNITY): Payer: PPO | Admitting: Physical Therapy

## 2015-03-03 DIAGNOSIS — M6289 Other specified disorders of muscle: Secondary | ICD-10-CM

## 2015-03-03 DIAGNOSIS — T148XXA Other injury of unspecified body region, initial encounter: Secondary | ICD-10-CM

## 2015-03-03 DIAGNOSIS — S91102D Unspecified open wound of left great toe without damage to nail, subsequent encounter: Secondary | ICD-10-CM | POA: Diagnosis not present

## 2015-03-03 NOTE — Therapy (Signed)
[place], [place], [address] Phone: [phone]   Fax:  [phone]  Wound Care Therapy reassessment  Patient Details  Name: [name] MRN: [id number] Date of Birth: 11/07/1945 Referring Provider:  [name], MD  Encounter Date: 03/03/2015      PT End of Session - 03/03/15 1055    Visit Number 27   Number of Visits 34   Date for PT Re-Evaluation 03/15/15   Authorization Time Period G-code done 27th visit    Authorization - Visit Number 27   Authorization - Number of Visits 34   PT Start Time 0850   PT Stop Time 0930   PT Time Calculation (min) 40 min   Activity Tolerance Patient tolerated treatment well      Past Medical History  Diagnosis Date  . Hypertension   . Diabetes mellitus   . Carotid artery occlusion   . Stroke March 08, 2012  . Hyperlipidemia   . Peripheral arterial disease     nonhealing ulcers bilaterally on each great toe  . PONV (postoperative nausea and vomiting)   . Arthritis   . Hypothyroidism   . Shortness of breath   . Obstructive sleep apnea     Past Surgical History  Procedure Laterality Date  . Back surgery    . Cervical fusion    . Foot surgery    . Spine surgery    . Eye surgery    . Endarterectomy Left 11/29/2013    Procedure: ENDARTERECTOMY CAROTID;  Surgeon: Serafina Mitchell, MD;  Location: [facility] OR;  Service: Vascular;  Laterality: Left;  . Carotid endarterectomy Left   . Pv angiogram  02/18/2014    tibial vessel diseas bil.  . Angioplasty  02/28/14    diamond back orbital rotational atherectomy of Rt. tibial  . Lower ext duplex doppler  03/14/14    Rt ABI 1.2  . Lower extremity angiogram Bilateral 02/18/2014    Procedure: LOWER EXTREMITY ANGIOGRAM;  Surgeon: [name], MD;  Location: [facility] CATH LAB;  Service: Cardiovascular;  Laterality: Bilateral;  . Lower extremity angiogram N/A 10/31/2014    Procedure: LOWER EXTREMITY ANGIOGRAM;  Surgeon: [name], MD;  Location: [facility] CATH LAB;  Service: Cardiovascular;  Laterality: N/A;    There were no vitals filed for this visit.  Visit Diagnosis:  Nonhealing nonsurgical wound with necrosis of muscle                 Wound Therapy - 03/03/15 1042    Subjective Pt states he feels he is walking better    Patient and Family Stated Goals wounds to heal    Prior Treatments podiatrists self care.   Pressure Ulcer Properties Date First Assessed: 02/25/15 Location: Toe (Comment  which one) Location Orientation: Left Staging: Stage III -  Full thickness tissue loss. Subcutaneous fat may be visible but bone, tendon or muscle are NOT exposed.   Dressing Type Gauze (Comment)   Dressing Changed   Dressing Change Frequency Daily  by Pt; pt will remain coming to therapy once a week   State of Healing Other (Comment)  callous formation around wound    Site / Wound Assessment Clean;Red   % Wound base Red or Granulating 100%  was 99%   Peri-wound Assessment Other (Comment)  calloused   Margins Epibole (rolled edges)   Drainage Amount Scant   Drainage Description Serous   Wound Properties Date First Assessed: 03/14/14 Time  First Assessed: 1930 Wound Type: Diabetic ulcer Location: Other (Comment) , r big toe  Location Orientation: Right Wound Description (Comments): nonhealing wound on planar aspct of MTP of great toe  Present on Admission: Yes   Dressing Type --  Medihoney, gauze and medipore tape   Dressing Status Old drainage   Dressing Change Frequency Daily  per pt   Site / Wound Assessment Red   % Wound base Red or Granulating 100%   Peri-wound Assessment --  calloused   Margins Epibole (rolled edges)   Drainage Amount Scant   Drainage Description Serous   Selective Debridement - Location callous area and epibled edges   Selective Debridement - Tools Used Forceps;Scissors   Selective Debridement - Tissue Removed callous; dead skin   Wound Therapy - Clinical Statement Pt wound on  LT great toe reopened last week.  Pt wound on Rt LE is slowly closing in with significant callous formation.  Pt has a corn that has appeared on the lateral aspect of his 3rd toe therapist urged pt to seek podiatrist.  Therapist recommends continured therapy one time a week for removal of calllous areas to allow wounds to heal.  Wounds sizes continue to vary due to callous formation and debridement.     Wound Therapy - Functional Problem List difficulty walkng   Factors Delaying/Impairing Wound Healing Altered sensation;Diabetes Mellitus;Multiple medical problems;Vascular compromise   Wound Therapy - Frequency --  1 x a week  for 6 more weeks.    Wound Therapy - Current Recommendations PT   Wound Plan Continue with wound care.  Pt recieved diabetic shoes from podiatrist.  Therapist feels going to an orthotist may be benefical to allow orthotist to adjust shoes for pressure relieving areas .  Recommend orthotic consult.    Dressing  medihoney to wound, vaseline to callous area followed by gauze and kling dressing    Decrease Necrotic Tissue to STG: 4 weeks: 0%   Decrease Necrotic Tissue - Progress Met   Increase Granulation Tissue to STG: 4 weeks 100%   Increase Granulation Tissue - Progress Met   Decrease Length/Width/Depth by (cm) STG: decrease Lt to have no tunnelling ; Rt to be decresed to 1.0x .75 x .1 STG for 4 weeks;  LTG healed 8 weeks   Decrease Length/Width/Depth - Progress Progressing toward goal   Additional Wound Therapy Goal Pt to state it is easier to walk 8 weeks    Additional Wound Therapy Goal - Progress Progressing toward goal   Time For Goal Achievement Other (comment)  continue therapy for 6 more weeks.    Wound Therapy - Potential for Goals Fair  due to comorbidities.  Pt needs continued wound care                 PT Education - 2015/03/23 1054    Education provided Yes   Education Details educated to see podiatrist about corns on feet; educated about getting an  orthotic consult for preassure relief areas in shoes.    Person(s) Educated Patient   Methods Explanation   Comprehension Verbalized understanding                      G-Codes - 03/23/2015 1056    Functional Assessment Tool Used Based on skilled clinical assessment, wound dimensions, slow wound healing progress    Functional Limitation Other PT primary   Other PT Primary Current Status (E7035) At least 20 percent but less than 40 percent impaired, limited  or restricted   Other PT Primary Goal Status (I7129) At least 1 percent but less than 20 percent impaired, limited or restricted       Problem List Patient Active Problem List   Diagnosis Date Noted  . Nonhealing skin ulcer 10/28/2014  . PAD (peripheral artery disease) 10/28/2014  . Bilateral carotid artery disease 07/08/2014  . Critical lower limb ischemia 02/28/2014  . Obstructive sleep apnea 02/12/2014  . Aftercare following surgery of the circulatory system, [place] 12/04/2013  . Carotid stenosis 11/29/2013  . Restless leg syndrome 10/23/2013  . Sleep apnea 10/23/2013  . Obesity (BMI 30.0-34.9) 10/23/2013  . Hyperlipidemia 07/16/2013  . Peripheral arterial disease 07/16/2013  . Occlusion and stenosis of carotid artery without mention of cerebral infarction 04/24/2012  . History of stroke June 2013 03/14/2012  . Hypertension 03/14/2012  . Type 2 diabetes mellitus with circulatory disorder 03/14/2012  . Peripheral neuropathy 03/14/2012  . Cardiomyopathy- EF NL 5/14 03/14/2012  . ARTHRITIS, RIGHT FOOT 06/26/2008  Rayetta Humphrey, PT CLT [phone] 03/03/2015, 10:57 AM  [place], [place], [address] Phone: [phone]   Fax:  [phone]

## 2015-03-05 ENCOUNTER — Ambulatory Visit (HOSPITAL_COMMUNITY): Payer: PPO | Admitting: Physical Therapy

## 2015-03-05 DIAGNOSIS — S91102D Unspecified open wound of left great toe without damage to nail, subsequent encounter: Secondary | ICD-10-CM | POA: Diagnosis not present

## 2015-03-05 DIAGNOSIS — T148XXA Other injury of unspecified body region, initial encounter: Secondary | ICD-10-CM

## 2015-03-05 DIAGNOSIS — M6289 Other specified disorders of muscle: Secondary | ICD-10-CM

## 2015-03-05 NOTE — Therapy (Signed)
[place], [place], [address] Phone: [phone]   Fax:  [phone]  Wound Care Therapy  Patient Details  Name: [name] MRN: [id number] Date of Birth: May 08, 1946 Referring Provider:  [name], MD  Encounter Date: 03/05/2015      PT End of Session - 03/05/15 1035    Visit Number 28   Number of Visits 34   Date for PT Re-Evaluation 03/15/15   Authorization Time Period G-code done 27th visit    Authorization - Visit Number 2   Authorization - Number of Visits 37   PT Start Time 2010   PT Stop Time 0925   PT Time Calculation (min) 38 min   Activity Tolerance Patient tolerated treatment well   Behavior During Therapy [facility] for tasks assessed/performed      Past Medical History  Diagnosis Date  . Hypertension   . Diabetes mellitus   . Carotid artery occlusion   . Stroke March 08, 2012  . Hyperlipidemia   . Peripheral arterial disease     nonhealing ulcers bilaterally on each great toe  . PONV (postoperative nausea and vomiting)   . Arthritis   . Hypothyroidism   . Shortness of breath   . Obstructive sleep apnea     Past Surgical History  Procedure Laterality Date  . Back surgery    . Cervical fusion    . Foot surgery    . Spine surgery    . Eye surgery    . Endarterectomy Left 11/29/2013    Procedure: ENDARTERECTOMY CAROTID;  Surgeon: Serafina Mitchell, MD;  Location: [facility] OR;  Service: Vascular;  Laterality: Left;  . Carotid endarterectomy Left   . Pv angiogram  02/18/2014    tibial vessel diseas bil.  . Angioplasty  02/28/14    diamond back orbital rotational atherectomy of Rt. tibial  . Lower ext duplex doppler  03/14/14    Rt ABI 1.2  . Lower extremity angiogram Bilateral 02/18/2014    Procedure: LOWER EXTREMITY ANGIOGRAM;  Surgeon: [name], MD;  Location: [facility] CATH LAB;  Service: Cardiovascular;  Laterality: Bilateral;  . Lower extremity angiogram N/A 10/31/2014    Procedure:  LOWER EXTREMITY ANGIOGRAM;  Surgeon: [name], MD;  Location: [facility] CATH LAB;  Service: Cardiovascular;  Laterality: N/A;    There were no vitals filed for this visit.  Visit Diagnosis:  Nonhealing nonsurgical wound with necrosis of muscle                 Wound Therapy - 03/05/15 1017    Subjective Pt states he purchased the cosmetic cotton rounds to place over wound and cut out the middle for pressure relief.    Patient and Family Stated Goals wounds to heal    Prior Treatments podiatrists self care.   Pressure Ulcer Properties Date First Assessed: 02/25/15 Location: Toe (Comment  which one) Location Orientation: Left Staging: Stage III -  Full thickness tissue loss. Subcutaneous fat may be visible but bone, tendon or muscle are NOT exposed.   Dressing Type Gauze (Comment)   Dressing Changed   Dressing Change Frequency Daily  by Pt; pt will remain coming to therapy once a week   State of Healing Other (Comment)  callous formation around wound    Site / Wound Assessment Clean;Red   % Wound base Red or Granulating 100%  was 99%   Peri-wound Assessment Other (Comment)  calloused   Margins Attached edges (  approximated)   Drainage Amount Scant   Drainage Description Serous   Wound Properties Date First Assessed: 03/14/14 Time First Assessed: 1930 Wound Type: Diabetic ulcer Location: Other (Comment) , r big toe  Location Orientation: Right Wound Description (Comments): nonhealing wound on planar aspct of MTP of great toe  Present on Admission: Yes   Dressing Type Impregnated gauze (bismuth)  gauze and medipore tape   Dressing Changed Changed   Dressing Status Old drainage   Dressing Change Frequency Daily  per pt   Site / Wound Assessment Red   % Wound base Red or Granulating 100%   Peri-wound Assessment --  calloused   Margins Attached edges (approximated)   Drainage Amount Scant   Drainage Description Serous   Treatment Cleansed;Debridement (Selective)    Selective Debridement - Location callous area and surrounding dry skin and slough   Selective Debridement - Tools Used Forceps;Scissors   Selective Debridement - Tissue Removed callous; dead skin   Wound Therapy - Clinical Statement Wounds are beginning to approximate with less rolling of edges/callous.   Changed dressing to xeroform today as they are 100% granulated.   Wound Therapy - Functional Problem List difficulty walkng   Factors Delaying/Impairing Wound Healing Altered sensation;Diabetes Mellitus;Multiple medical problems;Vascular compromise   Wound Therapy - Frequency --  1 x a week  for 6 more weeks.    Wound Therapy - Current Recommendations PT   Wound Plan Continue with wound care.  Fit with orthotic pressure relief insoles next visit for Rt foot to assist with wound healing.    Dressing  xeroform to wound gauze and medipore tape   Decrease Necrotic Tissue to STG: 4 weeks: 0%   Increase Granulation Tissue to STG: 4 weeks 100%   Decrease Length/Width/Depth by (cm) STG: decrease Lt to have no tunnelling ; Rt to be decresed to 1.0x .75 x .1 STG for 4 weeks;  LTG healed 8 weeks   Additional Wound Therapy Goal Pt to state it is easier to walk 8 weeks    Time For Goal Achievement Other (comment)  continue therapy for 6 more weeks.    Wound Therapy - Potential for Goals Fair  due to comorbidities.  Pt needs continued wound care                              Problem List Patient Active Problem List   Diagnosis Date Noted  . Nonhealing skin ulcer 10/28/2014  . PAD (peripheral artery disease) 10/28/2014  . Bilateral carotid artery disease 07/08/2014  . Critical lower limb ischemia 02/28/2014  . Obstructive sleep apnea 02/12/2014  . Aftercare following surgery of the circulatory system, [place] 12/04/2013  . Carotid stenosis 11/29/2013  . Restless leg syndrome 10/23/2013  . Sleep apnea 10/23/2013  . Obesity (BMI 30.0-34.9) 10/23/2013  . Hyperlipidemia 07/16/2013   . Peripheral arterial disease 07/16/2013  . Occlusion and stenosis of carotid artery without mention of cerebral infarction 04/24/2012  . History of stroke June 2013 03/14/2012  . Hypertension 03/14/2012  . Type 2 diabetes mellitus with circulatory disorder 03/14/2012  . Peripheral neuropathy 03/14/2012  . Cardiomyopathy- EF NL 5/14 03/14/2012  . ARTHRITIS, RIGHT FOOT 06/26/2008    Teena Irani, PTA/CLT [phone]  03/05/2015, 10:36 AM  [place], [place], [address] Phone: [phone]   Fax:  [phone]

## 2015-03-10 ENCOUNTER — Ambulatory Visit (HOSPITAL_COMMUNITY): Payer: PPO | Admitting: Physical Therapy

## 2015-03-10 DIAGNOSIS — M6289 Other specified disorders of muscle: Secondary | ICD-10-CM

## 2015-03-10 DIAGNOSIS — S91102D Unspecified open wound of left great toe without damage to nail, subsequent encounter: Secondary | ICD-10-CM | POA: Diagnosis not present

## 2015-03-10 DIAGNOSIS — T148XXA Other injury of unspecified body region, initial encounter: Secondary | ICD-10-CM

## 2015-03-10 NOTE — Therapy (Signed)
[place], [place], [address] Phone: [phone]   Fax:  [phone]  Wound Care Therapy  Patient Details  Name: [name] MRN: [id number] Date of Birth: 01-28-1946 Referring Provider:  [name], MD  Encounter Date: 03/10/2015      PT End of Session - 03/10/15 0930    Visit Number 29   Number of Visits 34   Date for PT Re-Evaluation 03/15/15   Authorization Type medicare   Authorization Time Period G-code done 27th visit    Authorization - Visit Number 29   Authorization - Number of Visits 37   PT Start Time 0840   PT Stop Time 0925   PT Time Calculation (min) 45 min   Activity Tolerance Patient tolerated treatment well   Behavior During Therapy [facility] for tasks assessed/performed      Past Medical History  Diagnosis Date  . Hypertension   . Diabetes mellitus   . Carotid artery occlusion   . Stroke March 08, 2012  . Hyperlipidemia   . Peripheral arterial disease     nonhealing ulcers bilaterally on each great toe  . PONV (postoperative nausea and vomiting)   . Arthritis   . Hypothyroidism   . Shortness of breath   . Obstructive sleep apnea     Past Surgical History  Procedure Laterality Date  . Back surgery    . Cervical fusion    . Foot surgery    . Spine surgery    . Eye surgery    . Endarterectomy Left 11/29/2013    Procedure: ENDARTERECTOMY CAROTID;  Surgeon: Serafina Mitchell, MD;  Location: [facility] OR;  Service: Vascular;  Laterality: Left;  . Carotid endarterectomy Left   . Pv angiogram  02/18/2014    tibial vessel diseas bil.  . Angioplasty  02/28/14    diamond back orbital rotational atherectomy of Rt. tibial  . Lower ext duplex doppler  03/14/14    Rt ABI 1.2  . Lower extremity angiogram Bilateral 02/18/2014    Procedure: LOWER EXTREMITY ANGIOGRAM;  Surgeon: [name], MD;  Location: [facility] CATH LAB;  Service: Cardiovascular;  Laterality: Bilateral;  . Lower extremity angiogram  N/A 10/31/2014    Procedure: LOWER EXTREMITY ANGIOGRAM;  Surgeon: [name], MD;  Location: [facility] CATH LAB;  Service: Cardiovascular;  Laterality: N/A;    There were no vitals filed for this visit.  Visit Diagnosis:  Nonhealing nonsurgical wound with necrosis of muscle                 Wound Therapy - 03/10/15 0926    Subjective Pt returns to podiatrist on Wednesday.  States he is staying off his feet as much as possible.    Patient and Family Stated Goals wounds to heal    Prior Treatments podiatrists self care.   Pressure Ulcer Properties Date First Assessed: 02/25/15 Location: Toe (Comment  which one) Location Orientation: Left Staging: Stage III -  Full thickness tissue loss. Subcutaneous fat may be visible but bone, tendon or muscle are NOT exposed.   Dressing Type Gauze (Comment)   Dressing Changed   Dressing Change Frequency Daily  by Pt; pt will remain coming to therapy once a week   State of Healing Other (Comment)  callous formation around wound    Site / Wound Assessment Clean;Red   % Wound base Red or Granulating 100%  was 99%   Peri-wound Assessment Other (Comment)  calloused  Margins Attached edges (approximated)   Drainage Amount Scant   Drainage Description Serous   Treatment Cleansed;Debridement (Selective)   Wound Properties Date First Assessed: 03/14/14 Time First Assessed: 1930 Wound Type: Diabetic ulcer Location: Other (Comment) , r big toe  Location Orientation: Right Wound Description (Comments): nonhealing wound on planar aspct of MTP of great toe  Present on Admission: Yes   Dressing Type Impregnated gauze (bismuth)  gauze and medipore tape   Dressing Changed Changed   Dressing Status Old drainage   Dressing Change Frequency Daily  per pt   Site / Wound Assessment Red   % Wound base Red or Granulating 100%   Peri-wound Assessment --  calloused   Margins Epibole (rolled edges)   Drainage Amount Minimal   Drainage Description Serous    Treatment Cleansed;Debridement (Selective)   Selective Debridement - Location callous area and surrounding dry skin and slough   Selective Debridement - Tools Used Forceps;Scissors   Selective Debridement - Tissue Removed callous; dead skin   Wound Therapy - Clinical Statement Removed insoles in shoes and replaced with pressure relief insole.  Removed pegs around MTP wound.  PT reported overall comfort and instructed to remove insole if began hurting.  Pt also instructed to inspect his feet daily.  continued with xeroform dressings and medipore.  callous continues to debride each visit.  Edipole on perimeter of Rt plantar wound.    Wound Therapy - Functional Problem List difficulty walkng   Factors Delaying/Impairing Wound Healing Altered sensation;Diabetes Mellitus;Multiple medical problems;Vascular compromise   Wound Therapy - Frequency --  1 x a week  for 6 more weeks.    Wound Therapy - Current Recommendations PT   Wound Plan Continue with wound care.   Dressing  xeroform to wound gauze and medipore tape   Decrease Necrotic Tissue to STG: 4 weeks: 0%   Increase Granulation Tissue to STG: 4 weeks 100%   Decrease Length/Width/Depth by (cm) STG: decrease Lt to have no tunnelling ; Rt to be decresed to 1.0x .75 x .1 STG for 4 weeks;  LTG healed 8 weeks   Additional Wound Therapy Goal Pt to state it is easier to walk 8 weeks    Time For Goal Achievement Other (comment)  continue therapy for 6 more weeks.    Wound Therapy - Potential for Goals Fair  due to comorbidities.  Pt needs continued wound care                              Problem List Patient Active Problem List   Diagnosis Date Noted  . Nonhealing skin ulcer 10/28/2014  . PAD (peripheral artery disease) 10/28/2014  . Bilateral carotid artery disease 07/08/2014  . Critical lower limb ischemia 02/28/2014  . Obstructive sleep apnea 02/12/2014  . Aftercare following surgery of the circulatory system, [place]  12/04/2013  . Carotid stenosis 11/29/2013  . Restless leg syndrome 10/23/2013  . Sleep apnea 10/23/2013  . Obesity (BMI 30.0-34.9) 10/23/2013  . Hyperlipidemia 07/16/2013  . Peripheral arterial disease 07/16/2013  . Occlusion and stenosis of carotid artery without mention of cerebral infarction 04/24/2012  . History of stroke June 2013 03/14/2012  . Hypertension 03/14/2012  . Type 2 diabetes mellitus with circulatory disorder 03/14/2012  . Peripheral neuropathy 03/14/2012  . Cardiomyopathy- EF NL 5/14 03/14/2012  . ARTHRITIS, RIGHT FOOT 06/26/2008    Teena Irani, PTA/CLT [phone]  03/10/2015, 9:32 AM  [place]  [place] [address] [place], [place], [address] Phone: [phone]   Fax:  [phone]

## 2015-03-12 ENCOUNTER — Ambulatory Visit (INDEPENDENT_AMBULATORY_CARE_PROVIDER_SITE_OTHER): Payer: PPO | Admitting: Podiatry

## 2015-03-12 ENCOUNTER — Encounter (HOSPITAL_COMMUNITY): Payer: Medicare Other | Admitting: Physical Therapy

## 2015-03-12 ENCOUNTER — Encounter: Payer: Self-pay | Admitting: Podiatry

## 2015-03-12 VITALS — BP 140/70 | HR 76 | Temp 98.6°F | Resp 16

## 2015-03-12 DIAGNOSIS — M79676 Pain in unspecified toe(s): Secondary | ICD-10-CM | POA: Diagnosis not present

## 2015-03-12 DIAGNOSIS — B351 Tinea unguium: Secondary | ICD-10-CM | POA: Diagnosis not present

## 2015-03-12 DIAGNOSIS — I739 Peripheral vascular disease, unspecified: Secondary | ICD-10-CM

## 2015-03-12 NOTE — Patient Instructions (Signed)
I recommend a surgical shoe on the right foot with your insole placed in the surgical shoe Follow-up instructions at wound care Will get clearance for your diabetic shoes and schedule follow-up visit

## 2015-03-12 NOTE — Progress Notes (Signed)
   Subjective:    Patient ID: [name], male    DOB: August 05, 1946, 69 y.o.   MRN: [id number]  HPI N- achy L- right foot inbetween the 3rd and 4th toe D-not sure,found by wound center O-slowly A-as the nail gets longer and cuts into it T- tried jelly spacers but it does not stay  Patient presents here today with a new problem of pain in between the toes. Patient is under the care of home care center and has had ongoing treatment for nonhealing diabetic wounds. Also, he relates a history of surgical intervention for peripheral arterial disease  Review of Systems  All other systems reviewed and are negative.      Objective:   Physical Exam  Pleasant orientated 3  Vascular: DP pulses 0/4 bilaterally PT pulse left 0/4 PT pulse right 2/4  Neurological: Sensation to 10 g monofilament 2/5 right and 3/4 left Vibratory sensation intact bilaterally Ankle reflex equal and reactive bilaterally  Dermatological: Lateral border third right toe has large hyperkeratotic lesion that remains closed after debridement Plantar right first MPJ has a 15 mm wound with a granular base with some hyperkeratotic tissue Plantar left hallux is a 4 x 2 superficial wound with granular base Small amount of ecchymosis dorsal second right toe The toenails are elongated, brittle, discolored 6-10    Musculoskeletal: HAV left Hammertoe second bilaterally  Assessment & Plan:   Assessment: Diabetic peripheral angiopathy and  neuropathy Patient is been evaluated for vascular status by Dr. Gwenlyn Found Patient under care for diabetic wounds at wound care center Mycotic toenails 10 Keratoses 1  Plan: Debridement toenails 10 and keratoses 1 without any bleeding Recommended patient wear surgical shoe on right foot Continue follow-up for wound care center Patient is requesting diabetic shoes will obtain certification from patient's primary physician and regional Dr. [name] for the following  indications: Type II diabetic Neuropathy  circulatory complications Ulcers HAV left Hammertoe second bilaterally Loss of protective sensation Diminished dorsalis pedis pulses bilaterally Diminished posterior tibial pulse left  Notify patient upon receipt of certification for diabetic shoes Return 3 months for skin a nail debridement

## 2015-03-17 ENCOUNTER — Ambulatory Visit (HOSPITAL_COMMUNITY): Payer: PPO | Admitting: Physical Therapy

## 2015-03-17 DIAGNOSIS — M6289 Other specified disorders of muscle: Secondary | ICD-10-CM

## 2015-03-17 DIAGNOSIS — S91102D Unspecified open wound of left great toe without damage to nail, subsequent encounter: Secondary | ICD-10-CM | POA: Diagnosis not present

## 2015-03-17 DIAGNOSIS — T148XXA Other injury of unspecified body region, initial encounter: Secondary | ICD-10-CM

## 2015-03-17 NOTE — Therapy (Signed)
[facility] [address] , [place], [address] Phone: [phone]   Fax:  [phone]  Wound Care Therapy  Patient Details  Name: [name] MRN: [id number] Date of Birth: 02/02/1946 Referring Provider:  Hawkins, Edward, MD  Encounter Date: 03/17/2015      PT End of Session - 03/17/15 1229    Visit Number 30   Number of Visits 31   Date for PT Re-Evaluation 03/15/15   Authorization Type medicare   Authorization Time Period G-code done 27th visit    Authorization - Visit Number 30   Authorization - Number of Visits 31   PT Start Time 0850   PT Stop Time 0930   PT Time Calculation (min) 40 min      Past Medical History  Diagnosis Date  . Hypertension   . Diabetes mellitus   . Carotid artery occlusion   . Stroke March 08, 2012  . Hyperlipidemia   . Peripheral arterial disease     nonhealing ulcers bilaterally on each great toe  . PONV (postoperative nausea and vomiting)   . Arthritis   . Hypothyroidism   . Shortness of breath   . Obstructive sleep apnea     Past Surgical History  Procedure Laterality Date  . Back surgery    . Cervical fusion    . Foot surgery    . Spine surgery    . Eye surgery    . Endarterectomy Left 11/29/2013    Procedure: ENDARTERECTOMY CAROTID;  Surgeon: Vance W Brabham, MD;  Location: [facility] OR;  Service: Vascular;  Laterality: Left;  . Carotid endarterectomy Left   . Pv angiogram  02/18/2014    tibial vessel diseas bil.  . Angioplasty  02/28/14    diamond back orbital rotational atherectomy of Rt. tibial  . Lower ext duplex doppler  03/14/14    Rt ABI 1.2  . Lower extremity angiogram Bilateral 02/18/2014    Procedure: LOWER EXTREMITY ANGIOGRAM;  Surgeon: Jonathan J Berry, MD;  Location: [facility] CATH LAB;  Service: Cardiovascular;  Laterality: Bilateral;  . Lower extremity angiogram N/A 10/31/2014    Procedure: LOWER EXTREMITY ANGIOGRAM;  Surgeon: Jonathan J Berry, MD;  Location: [facility] CATH LAB;   Service: Cardiovascular;  Laterality: N/A;    There were no vitals filed for this visit.  Visit Diagnosis:  Nonhealing nonsurgical wound with necrosis of muscle                 Wound Therapy - 03/17/15 1214    Subjective Pt states the podiatrist took the corn off of his foot.  Pt states that he was also told that he is eligible for new diabetic shoes.     Patient and Family Stated Goals wounds to heal    Prior Treatments podiatrists self care.   Pain Assessment No/denies pain   Pressure Ulcer Properties Date First Assessed: 02/25/15 Location: Toe (Comment  which one) Location Orientation: Left Staging: Stage III -  Full thickness tissue loss. Subcutaneous fat may be visible but bone, tendon or muscle are NOT exposed.   Dressing Type Gauze (Comment)   Dressing Changed   Dressing Change Frequency PRN  by Pt; pt will remain coming to therapy once a week   State of Healing Other (Comment)  callous formation wound is not present   % Wound base Red or Granulating 100%  was 99%   Peri-wound Assessment Other (Comment)  calloused   Treatment Cleansed  made 1/4 moon shape   with abd pad to place medial to callous    Wound Properties Date First Assessed: 03/14/14 Time First Assessed: 1930 Wound Type: Diabetic ulcer Location: Other (Comment) , r big toe  Location Orientation: Right Wound Description (Comments): nonhealing wound on planar aspct of MTP of great toe  Present on Admission: Yes   Dressing Type Impregnated gauze (bismuth)  gauze and medipore tape   Dressing Changed Changed   Dressing Status Old drainage   Dressing Change Frequency Daily  per pt   Site / Wound Assessment Red   % Wound base Red or Granulating 100%   Peri-wound Assessment --  calloused   Margins Epibole (rolled edges)   Drainage Amount Minimal   Drainage Description Serous   Treatment Cleansed;Debridement (Selective)  made donut shape abpad to place over wound to decrease press   Selective Debridement  - Location callous area and surrounding dry skin and slough   Selective Debridement - Tools Used Forceps;Scissors   Selective Debridement - Tissue Removed callous; dead skin   Wound Therapy - Clinical Statement Pt no longer has an open wound on his Lt great toe but there is callous formation.  Pt had significant callous removal from Lt foot.  Therapist placed an order for an orthotist for pt to recieve new shoes with pressure removing insoles.    Wound Therapy - Functional Problem List difficulty walkng   Factors Delaying/Impairing Wound Healing Altered sensation;Diabetes Mellitus;Multiple medical problems;Vascular compromise   Wound Therapy - Frequency --  1 x a week  for 1 more weeks.    Wound Therapy - Current Recommendations PT   Wound Plan reassess next treatment    Dressing  xeroform to wound gauze and medipore tape   Decrease Necrotic Tissue to STG: 4 weeks: 0%   Decrease Necrotic Tissue - Progress Met   Increase Granulation Tissue to STG: 4 weeks 100%   Increase Granulation Tissue - Progress Met   Decrease Length/Width/Depth by (cm) STG: decrease Lt to have no tunnelling ; Rt to be decresed to 1.0x .75 x .1 STG for 4 weeks;  LTG healed 8 weeks   Decrease Length/Width/Depth - Progress Progressing toward goal   Additional Wound Therapy Goal Pt to state it is easier to walk 8 weeks    Additional Wound Therapy Goal - Progress Progressing toward goal   Time For Goal Achievement --  continue therapy for 6 more weeks.    Wound Therapy - Potential for Goals Fair  due to comorbidities.  Pt needs continued wound care                 PT Education - 03/17/15 1229    Education provided Yes   Education Details importance of getting new shoes with an orthotist providing pressure relief areas.   Script sent to MD today for signing   Person(s) Educated Patient   Methods Explanation   Comprehension Verbalized understanding           Problem List Patient Active Problem List    Diagnosis Date Noted  . Nonhealing skin ulcer 10/28/2014  . PAD (peripheral artery disease) 10/28/2014  . Bilateral carotid artery disease 07/08/2014  . Critical lower limb ischemia 02/28/2014  . Obstructive sleep apnea 02/12/2014  . Aftercare following surgery of the circulatory system, [place] 12/04/2013  . Carotid stenosis 11/29/2013  . Restless leg syndrome 10/23/2013  . Sleep apnea 10/23/2013  . Obesity (BMI 30.0-34.9) 10/23/2013  . Hyperlipidemia 07/16/2013  . Peripheral arterial disease 07/16/2013  . Occlusion  and stenosis of carotid artery without mention of cerebral infarction 04/24/2012  . History of stroke June 2013 03/14/2012  . Hypertension 03/14/2012  . Type 2 diabetes mellitus with circulatory disorder 03/14/2012  . Peripheral neuropathy 03/14/2012  . Cardiomyopathy- EF NL 5/14 03/14/2012  . ARTHRITIS, RIGHT FOOT 06/26/2008  Cynthia Russell, PT CLT [phone] 03/17/2015, 12:31 PM  [place] [facility] [address] [place], [place], [address] Phone: [phone]   Fax:  [phone]       

## 2015-03-19 ENCOUNTER — Encounter (HOSPITAL_COMMUNITY): Payer: Medicare Other | Admitting: Physical Therapy

## 2015-03-25 ENCOUNTER — Telehealth (HOSPITAL_COMMUNITY): Payer: Self-pay

## 2015-03-25 ENCOUNTER — Ambulatory Visit (HOSPITAL_COMMUNITY): Payer: PPO | Attending: Podiatry

## 2015-03-25 DIAGNOSIS — T148 Other injury of unspecified body region: Secondary | ICD-10-CM | POA: Diagnosis present

## 2015-03-25 DIAGNOSIS — M6289 Other specified disorders of muscle: Secondary | ICD-10-CM | POA: Diagnosis not present

## 2015-03-25 DIAGNOSIS — T148XXA Other injury of unspecified body region, initial encounter: Secondary | ICD-10-CM

## 2015-03-25 NOTE — Therapy (Addendum)
[place], [place], [address] Phone: [phone]   Fax:  [phone]  Wound Care Therapy  Patient Details  Name: [name] MRN: [id number] Date of Birth: 02/05/1946 Referring Provider:  [name], MD  Encounter Date: 03/25/2015      PT End of Session - 03/25/15 1012    Visit Number 31   Number of Visits 35   Date for PT Re-Evaluation 04/22/15   Authorization Type medicare   Authorization Time Period G-code done 27th visit    Authorization - Visit Number 41   Authorization - Number of Visits 37   PT Start Time [phone]   PT Stop Time 0930   PT Time Calculation (min) 34 min   Activity Tolerance Patient tolerated treatment well   Behavior During Therapy [facility] for tasks assessed/performed      Past Medical History  Diagnosis Date  . Hypertension   . Diabetes mellitus   . Carotid artery occlusion   . Stroke March 08, 2012  . Hyperlipidemia   . Peripheral arterial disease     nonhealing ulcers bilaterally on each great toe  . PONV (postoperative nausea and vomiting)   . Arthritis   . Hypothyroidism   . Shortness of breath   . Obstructive sleep apnea     Past Surgical History  Procedure Laterality Date  . Back surgery    . Cervical fusion    . Foot surgery    . Spine surgery    . Eye surgery    . Endarterectomy Left 11/29/2013    Procedure: ENDARTERECTOMY CAROTID;  Surgeon: Serafina Mitchell, MD;  Location: [facility] OR;  Service: Vascular;  Laterality: Left;  . Carotid endarterectomy Left   . Pv angiogram  02/18/2014    tibial vessel diseas bil.  . Angioplasty  02/28/14    diamond back orbital rotational atherectomy of Rt. tibial  . Lower ext duplex doppler  03/14/14    Rt ABI 1.2  . Lower extremity angiogram Bilateral 02/18/2014    Procedure: LOWER EXTREMITY ANGIOGRAM;  Surgeon: [name], MD;  Location: [facility] CATH LAB;  Service: Cardiovascular;  Laterality: Bilateral;  . Lower extremity angiogram  N/A 10/31/2014    Procedure: LOWER EXTREMITY ANGIOGRAM;  Surgeon: [name], MD;  Location: [facility] CATH LAB;  Service: Cardiovascular;  Laterality: N/A;    There were no vitals filed for this visit.  Visit Diagnosis:  Nonhealing nonsurgical wound with necrosis of muscle      Subjective Assessment - 03/25/15 0954    Subjective Pt entered dept stateing he felt his walking is improving, no reports of pain today but is having problems with restless legs.   Currently in Pain? No/denies                   Wound Therapy - 03/25/15 0956    Subjective Pt entered dept stateing he felt his walking is improving, no reports of pain today but is having problems with restless legs.   Patient and Family Stated Goals wounds to heal    Prior Treatments podiatrists self care.   Pain Assessment No/denies pain   Pressure Ulcer Properties Date First Assessed: 02/25/15 Location: Toe (Comment  which one) Location Orientation: Left Staging: Stage III -  Full thickness tissue loss. Subcutaneous fat may be visible but bone, tendon or muscle are NOT exposed.   Wound Properties Date First Assessed: 03/14/14 Time First Assessed: 1930 Wound Type: Diabetic ulcer Location:  Other (Comment) , r big toe  Location Orientation: Right Wound Description (Comments): nonhealing wound on planar aspct of MTP of great toe  Present on Admission: Yes   Dressing Type Impregnated gauze (bismuth)  Xeroform, vaseline perimeter, 2x2 medipore tape   Dressing Changed Changed   Dressing Status Old drainage   Dressing Change Frequency Daily   Site / Wound Assessment Red   % Wound base Red or Granulating 100%   Peri-wound Assessment Other (Comment)  callous perimeter of wound   Wound Length (cm) 1.1 cm  was 1.1   Wound Width (cm) 1.1 cm  was 1.1   Wound Depth (cm) 0.2 cm  was .2   Margins Epibole (rolled edges)   Drainage Amount Scant   Drainage Description Serous   Treatment Cleansed;Debridement (Selective)    Selective Debridement - Location callous area and surrounding dry skin and slough   Selective Debridement - Tools Used Forceps;Scissors   Selective Debridement - Tissue Removed callous; dead skin   Wound Therapy - Clinical Statement Pt. given signed referral for pressure relief diabetic shoes.  Lt great toe wound is fully healed, just callous remaining.  Rt metatarsal region focus on removal of callous surrounding perimeter of wound, noted decreased in epibole to increase approximation.   Wound Therapy - Functional Problem List difficulty walkng   Factors Delaying/Impairing Wound Healing Altered sensation;Diabetes Mellitus;Multiple medical problems;Vascular compromise   Wound Therapy - Frequency --  1x/ week   Wound Therapy - Current Recommendations PT   Wound Plan Recommend continuing OPPT for 4 more weeks for wound healing.   Dressing  xeroform to wound gauze and medipore tape   Decrease Necrotic Tissue to STG: 4 weeks: 0%   Decrease Necrotic Tissue - Progress Met   Increase Granulation Tissue to STG: 4 weeks 100%   Increase Granulation Tissue - Progress Met   Decrease Length/Width/Depth by (cm) STG: decrease Lt to have no tunnelling ; Rt to be decresed to 1.0x .75 x .1 STG for 4 weeks;  LTG healed 8 weeks   Decrease Length/Width/Depth - Progress Goal set today   Additional Wound Therapy Goal Pt to state it is easier to walk 8 weeks    Additional Wound Therapy Goal - Progress Progressing toward goal   Wound Therapy - Potential for Goals Fair  due to comorbidities, pt needs to continue             Problem List Patient Active Problem List   Diagnosis Date Noted  . Nonhealing skin ulcer 10/28/2014  . PAD (peripheral artery disease) 10/28/2014  . Bilateral carotid artery disease 07/08/2014  . Critical lower limb ischemia 02/28/2014  . Obstructive sleep apnea 02/12/2014  . Aftercare following surgery of the circulatory system, [place] 12/04/2013  . Carotid stenosis 11/29/2013  .  Restless leg syndrome 10/23/2013  . Sleep apnea 10/23/2013  . Obesity (BMI 30.0-34.9) 10/23/2013  . Hyperlipidemia 07/16/2013  . Peripheral arterial disease 07/16/2013  . Occlusion and stenosis of carotid artery without mention of cerebral infarction 04/24/2012  . History of stroke June 2013 03/14/2012  . Hypertension 03/14/2012  . Type 2 diabetes mellitus with circulatory disorder 03/14/2012  . Peripheral neuropathy 03/14/2012  . Cardiomyopathy- EF NL 5/14 03/14/2012  . ARTHRITIS, RIGHT FOOT 06/26/2008   [name], PTA  [name] 03/25/2015, 10:14 AM Rayetta Humphrey, PT CLT [place] [address] [place], [place], [address] Phone: [phone]   Fax:  [phone]

## 2015-03-27 ENCOUNTER — Ambulatory Visit (HOSPITAL_COMMUNITY): Payer: PPO | Admitting: Physical Therapy

## 2015-03-27 DIAGNOSIS — M6289 Other specified disorders of muscle: Secondary | ICD-10-CM

## 2015-03-27 DIAGNOSIS — T148XXA Other injury of unspecified body region, initial encounter: Secondary | ICD-10-CM

## 2015-03-27 NOTE — Therapy (Signed)
[place], [place], [address] Phone: [phone]   Fax:  [phone]  Wound Care Therapy  Patient Details  Name: [name] MRN: [id number] Date of Birth: 1946/09/04 Referring Provider:  [name], MD  Encounter Date: 03/27/2015      PT End of Session - 03/27/15 1630    Visit Number 32   Number of Visits 35   Date for PT Re-Evaluation 04/22/15   Authorization Type medicare   Authorization Time Period G-code done 27th visit    Authorization - Visit Number 59   Authorization - Number of Visits 37   PT Start Time 1350   PT Stop Time 1425   PT Time Calculation (min) 35 min   Activity Tolerance Patient tolerated treatment well   Behavior During Therapy [facility] for tasks assessed/performed      Past Medical History  Diagnosis Date  . Hypertension   . Diabetes mellitus   . Carotid artery occlusion   . Stroke March 08, 2012  . Hyperlipidemia   . Peripheral arterial disease     nonhealing ulcers bilaterally on each great toe  . PONV (postoperative nausea and vomiting)   . Arthritis   . Hypothyroidism   . Shortness of breath   . Obstructive sleep apnea     Past Surgical History  Procedure Laterality Date  . Back surgery    . Cervical fusion    . Foot surgery    . Spine surgery    . Eye surgery    . Endarterectomy Left 11/29/2013    Procedure: ENDARTERECTOMY CAROTID;  Surgeon: Serafina Mitchell, MD;  Location: [facility] OR;  Service: Vascular;  Laterality: Left;  . Carotid endarterectomy Left   . Pv angiogram  02/18/2014    tibial vessel diseas bil.  . Angioplasty  02/28/14    diamond back orbital rotational atherectomy of Rt. tibial  . Lower ext duplex doppler  03/14/14    Rt ABI 1.2  . Lower extremity angiogram Bilateral 02/18/2014    Procedure: LOWER EXTREMITY ANGIOGRAM;  Surgeon: [name], MD;  Location: [facility] CATH LAB;  Service: Cardiovascular;  Laterality: Bilateral;  . Lower extremity angiogram  N/A 10/31/2014    Procedure: LOWER EXTREMITY ANGIOGRAM;  Surgeon: [name], MD;  Location: [facility] CATH LAB;  Service: Cardiovascular;  Laterality: N/A;    There were no vitals filed for this visit.  Visit Diagnosis:  Nonhealing nonsurgical wound with necrosis of muscle                 Wound Therapy - 03/27/15 1623    Subjective Pt reports no issues or complaints.   Patient and Family Stated Goals wounds to heal    Prior Treatments podiatrists self care.   Pressure Ulcer Properties Date First Assessed: 02/25/15 Location: Toe (Comment  which one) Location Orientation: Left Staging: Stage III -  Full thickness tissue loss. Subcutaneous fat may be visible but bone, tendon or muscle are NOT exposed.   Dressing Type Gauze (Comment)   Dressing Changed   Dressing Change Frequency PRN  by Pt; pt will remain coming to therapy once a week   State of Healing Other (Comment)  callous formation wound is not present   % Wound base Red or Granulating 100%  was 99%   Peri-wound Assessment Other (Comment)  calloused   Drainage Amount None   Treatment Cleansed   Wound Properties Date First Assessed: 03/14/14 Time First Assessed: 1930  Wound Type: Diabetic ulcer Location: Other (Comment) , r big toe  Location Orientation: Right Wound Description (Comments): nonhealing wound on planar aspct of MTP of great toe  Present on Admission: Yes   Dressing Type Impregnated gauze (bismuth)  gauze and medipore tape   Dressing Changed Changed   Dressing Status Old drainage   Dressing Change Frequency Daily  per pt   Site / Wound Assessment Red   % Wound base Red or Granulating 100%   Peri-wound Assessment --  calloused   Margins Epibole (rolled edges)   Drainage Amount Minimal   Drainage Description Serous   Treatment Cleansed;Debridement (Selective)   Selective Debridement - Location callous area and surrounding dry skin and slough   Selective Debridement - Tools Used Forceps;Scissors    Selective Debridement - Tissue Removed callous; dead skin   Wound Therapy - Clinical Statement Pt no longer has an open wound on his Lt great toe but there is callous formation.  Pt had significant callous removal from Lt foot.  Therapist placed an order for an orthotist for pt to recieve new shoes with pressure removing insoles.    Wound Therapy - Functional Problem List difficulty walkng   Factors Delaying/Impairing Wound Healing Altered sensation;Diabetes Mellitus;Multiple medical problems;Vascular compromise   Wound Therapy - Frequency --  1 x a week  for 1 more weeks.    Wound Therapy - Current Recommendations PT   Wound Plan continue woundcare.  Check on order for diabetic shoes/insoles order.   Dressing  xeroform to wound gauze and medipore tape   Decrease Necrotic Tissue to STG: 4 weeks: 0%   Increase Granulation Tissue to STG: 4 weeks 100%   Decrease Length/Width/Depth by (cm) STG: decrease Lt to have no tunnelling ; Rt to be decresed to 1.0x .75 x .1 STG for 4 weeks;  LTG healed 8 weeks   Additional Wound Therapy Goal Pt to state it is easier to walk 8 weeks    Time For Goal Achievement --  continue therapy for 6 more weeks.    Wound Therapy - Potential for Goals Fair  due to comorbidities.  Pt needs continued wound care                              Problem List Patient Active Problem List   Diagnosis Date Noted  . Nonhealing skin ulcer 10/28/2014  . PAD (peripheral artery disease) 10/28/2014  . Bilateral carotid artery disease 07/08/2014  . Critical lower limb ischemia 02/28/2014  . Obstructive sleep apnea 02/12/2014  . Aftercare following surgery of the circulatory system, [place] 12/04/2013  . Carotid stenosis 11/29/2013  . Restless leg syndrome 10/23/2013  . Sleep apnea 10/23/2013  . Obesity (BMI 30.0-34.9) 10/23/2013  . Hyperlipidemia 07/16/2013  . Peripheral arterial disease 07/16/2013  . Occlusion and stenosis of carotid artery without mention  of cerebral infarction 04/24/2012  . History of stroke June 2013 03/14/2012  . Hypertension 03/14/2012  . Type 2 diabetes mellitus with circulatory disorder 03/14/2012  . Peripheral neuropathy 03/14/2012  . Cardiomyopathy- EF NL 5/14 03/14/2012  . ARTHRITIS, RIGHT FOOT 06/26/2008   Teena Irani, PTA/CLT [phone]  03/27/2015, 4:34 PM  [place] [address] [place], [place], [address] Phone: [phone]   Fax:  [phone]

## 2015-04-01 ENCOUNTER — Ambulatory Visit (HOSPITAL_COMMUNITY): Payer: PPO | Admitting: Physical Therapy

## 2015-04-01 DIAGNOSIS — T148XXA Other injury of unspecified body region, initial encounter: Secondary | ICD-10-CM

## 2015-04-01 DIAGNOSIS — M6289 Other specified disorders of muscle: Secondary | ICD-10-CM | POA: Diagnosis not present

## 2015-04-01 NOTE — Therapy (Signed)
[place], [place], [address] Phone: [phone]   Fax:  [phone]  Wound Care Therapy  Patient Details  Name: [name] MRN: [id number] Date of Birth: 12-Feb-1946 Referring Provider:  [name], MD  Encounter Date: 04/01/2015      PT End of Session - 04/01/15 0848    Visit Number 33   Number of Visits 35   Date for PT Re-Evaluation 04/22/15   Authorization Type medicare   Authorization Time Period G-code done 27th visit    Authorization - Visit Number 33   Authorization - Number of Visits 37   PT Start Time 0800   PT Stop Time 0830   PT Time Calculation (min) 30 min   Activity Tolerance Patient tolerated treatment well   Behavior During Therapy [facility] for tasks assessed/performed      Past Medical History  Diagnosis Date  . Hypertension   . Diabetes mellitus   . Carotid artery occlusion   . Stroke March 08, 2012  . Hyperlipidemia   . Peripheral arterial disease     nonhealing ulcers bilaterally on each great toe  . PONV (postoperative nausea and vomiting)   . Arthritis   . Hypothyroidism   . Shortness of breath   . Obstructive sleep apnea     Past Surgical History  Procedure Laterality Date  . Back surgery    . Cervical fusion    . Foot surgery    . Spine surgery    . Eye surgery    . Endarterectomy Left 11/29/2013    Procedure: ENDARTERECTOMY CAROTID;  Surgeon: Serafina Mitchell, MD;  Location: [facility] OR;  Service: Vascular;  Laterality: Left;  . Carotid endarterectomy Left   . Pv angiogram  02/18/2014    tibial vessel diseas bil.  . Angioplasty  02/28/14    diamond back orbital rotational atherectomy of Rt. tibial  . Lower ext duplex doppler  03/14/14    Rt ABI 1.2  . Lower extremity angiogram Bilateral 02/18/2014    Procedure: LOWER EXTREMITY ANGIOGRAM;  Surgeon: [name], MD;  Location: [facility] CATH LAB;  Service: Cardiovascular;  Laterality: Bilateral;  . Lower extremity angiogram  N/A 10/31/2014    Procedure: LOWER EXTREMITY ANGIOGRAM;  Surgeon: [name], MD;  Location: [facility] CATH LAB;  Service: Cardiovascular;  Laterality: N/A;    There were no vitals filed for this visit.  Visit Diagnosis:  Nonhealing nonsurgical wound with necrosis of muscle                 Wound Therapy - 04/01/15 0843    Subjective Pt reports no issues or complaints.   Patient and Family Stated Goals wounds to heal    Prior Treatments podiatrists self care.   Pressure Ulcer Properties Date First Assessed: 02/25/15 Location: Toe (Comment  which one) Location Orientation: Left Staging: Stage III -  Full thickness tissue loss. Subcutaneous fat may be visible but bone, tendon or muscle are NOT exposed.   Dressing Type Impregnated gauze (bismuth);Gauze (Comment)   Dressing Changed   Dressing Change Frequency PRN  by Pt; pt will remain coming to therapy once a week   State of Healing Other (Comment)  callous formation wound is not present   % Wound base Red or Granulating 100%  was 99%   Peri-wound Assessment Other (Comment)  calloused   Drainage Amount None   Treatment Cleansed;Debridement (Selective)   Wound Properties Date First Assessed: 03/14/14 Time  First Assessed: 1930 Wound Type: Diabetic ulcer Location: Other (Comment) , r big toe  Location Orientation: Right Wound Description (Comments): nonhealing wound on planar aspct of MTP of great toe  Present on Admission: Yes   Dressing Type Impregnated gauze (bismuth)  gauze and medipore tape   Dressing Changed Changed   Dressing Status Old drainage   Dressing Change Frequency Daily  per pt   Site / Wound Assessment Red   % Wound base Red or Granulating 100%   Peri-wound Assessment --  calloused   Margins Epibole (rolled edges)  on the top only; inferior is approximating   Drainage Amount Minimal   Drainage Description Serosanguineous   Treatment Cleansed;Debridement (Selective)   Selective Debridement - Location  callous area and surrounding dry skin and slough   Selective Debridement - Tools Used Forceps;Scalpel   Selective Debridement - Tissue Removed callous; dead skin   Wound Therapy - Clinical Statement Difficulty debriding callous from plantar Rt foot due to restless leg and patient twitching foot alot during session.  Removed majority from Lt great toe with wound appeared to still be healed beneath.  Dressed both wounds with xeroform, gauze and medipore tape to keep wound bed moist on Rt and soften callous bilaterally.  Urged patient to make appt wtih orthotist to get shoes with special pressure relief insoles to help heal wounds and prevent future ones.  Pt reported he would make appt.    Wound Therapy - Functional Problem List difficulty walkng   Factors Delaying/Impairing Wound Healing Altered sensation;Diabetes Mellitus;Multiple medical problems;Vascular compromise   Wound Therapy - Frequency --  1 x a week  for 1 more weeks.    Wound Therapy - Current Recommendations PT   Wound Plan continue woundcare.  check wtih patient to ensure appt made with orthotist.    Dressing  xeroform to wound gauze and medipore tape   Decrease Necrotic Tissue to STG: 4 weeks: 0%   Increase Granulation Tissue to STG: 4 weeks 100%   Decrease Length/Width/Depth by (cm) STG: decrease Lt to have no tunnelling ; Rt to be decresed to 1.0x .75 x .1 STG for 4 weeks;  LTG healed 8 weeks   Additional Wound Therapy Goal Pt to state it is easier to walk 8 weeks    Time For Goal Achievement --  continue therapy for 6 more weeks.    Wound Therapy - Potential for Goals Fair  due to comorbidities.  Pt needs continued wound care                              Problem List Patient Active Problem List   Diagnosis Date Noted  . Nonhealing skin ulcer 10/28/2014  . PAD (peripheral artery disease) 10/28/2014  . Bilateral carotid artery disease 07/08/2014  . Critical lower limb ischemia 02/28/2014  .  Obstructive sleep apnea 02/12/2014  . Aftercare following surgery of the circulatory system, [place] 12/04/2013  . Carotid stenosis 11/29/2013  . Restless leg syndrome 10/23/2013  . Sleep apnea 10/23/2013  . Obesity (BMI 30.0-34.9) 10/23/2013  . Hyperlipidemia 07/16/2013  . Peripheral arterial disease 07/16/2013  . Occlusion and stenosis of carotid artery without mention of cerebral infarction 04/24/2012  . History of stroke June 2013 03/14/2012  . Hypertension 03/14/2012  . Type 2 diabetes mellitus with circulatory disorder 03/14/2012  . Peripheral neuropathy 03/14/2012  . Cardiomyopathy- EF NL 5/14 03/14/2012  . ARTHRITIS, RIGHT FOOT 06/26/2008    Maura Braaten  Sula Soda, PTA/CLT [phone]  04/01/2015, 8:49 AM  [place] [address] [place], [place], [address] Phone: [phone]   Fax:  [phone]

## 2015-04-03 ENCOUNTER — Ambulatory Visit (HOSPITAL_COMMUNITY): Payer: PPO | Admitting: Physical Therapy

## 2015-04-03 DIAGNOSIS — M6289 Other specified disorders of muscle: Secondary | ICD-10-CM | POA: Diagnosis not present

## 2015-04-03 DIAGNOSIS — T148XXA Other injury of unspecified body region, initial encounter: Secondary | ICD-10-CM

## 2015-04-03 NOTE — Therapy (Signed)
[place], [place], [address] Phone: [phone]   Fax:  [phone]  Wound Care Therapy  Patient Details  Name: [name] MRN: [id number] Date of Birth: 1945-12-31 Referring Provider:  [name], MD  Encounter Date: 04/03/2015      PT End of Session - 04/03/15 0909    Visit Number 34   Number of Visits 37   Date for PT Re-Evaluation 04/22/15   Authorization Type medicare   Authorization Time Period G-code done 27th visit    Authorization - Visit Number 34   Authorization - Number of Visits 80   PT Start Time 0804   PT Stop Time 7253   PT Time Calculation (min) 30 min   Activity Tolerance Patient tolerated treatment well   Behavior During Therapy [facility] for tasks assessed/performed      Past Medical History  Diagnosis Date  . Hypertension   . Diabetes mellitus   . Carotid artery occlusion   . Stroke March 08, 2012  . Hyperlipidemia   . Peripheral arterial disease     nonhealing ulcers bilaterally on each great toe  . PONV (postoperative nausea and vomiting)   . Arthritis   . Hypothyroidism   . Shortness of breath   . Obstructive sleep apnea     Past Surgical History  Procedure Laterality Date  . Back surgery    . Cervical fusion    . Foot surgery    . Spine surgery    . Eye surgery    . Endarterectomy Left 11/29/2013    Procedure: ENDARTERECTOMY CAROTID;  Surgeon: Serafina Mitchell, MD;  Location: [facility] OR;  Service: Vascular;  Laterality: Left;  . Carotid endarterectomy Left   . Pv angiogram  02/18/2014    tibial vessel diseas bil.  . Angioplasty  02/28/14    diamond back orbital rotational atherectomy of Rt. tibial  . Lower ext duplex doppler  03/14/14    Rt ABI 1.2  . Lower extremity angiogram Bilateral 02/18/2014    Procedure: LOWER EXTREMITY ANGIOGRAM;  Surgeon: [name], MD;  Location: [facility] CATH LAB;  Service: Cardiovascular;  Laterality: Bilateral;  . Lower extremity angiogram  N/A 10/31/2014    Procedure: LOWER EXTREMITY ANGIOGRAM;  Surgeon: [name], MD;  Location: [facility] CATH LAB;  Service: Cardiovascular;  Laterality: N/A;    There were no vitals filed for this visit.  Visit Diagnosis:  Nonhealing nonsurgical wound with necrosis of muscle                 Wound Therapy - 04/03/15 0901    Subjective Pt reports no pain.  Still trying to get used to his shoes.   Patient and Family Stated Goals wounds to heal    Prior Treatments podiatrists self care.   Pressure Ulcer Properties Date First Assessed: 02/25/15 Location: Toe (Comment  which one) Location Orientation: Left Staging: Stage III -  Full thickness tissue loss. Subcutaneous fat may be visible but bone, tendon or muscle are NOT exposed.   Dressing Type None   Dressing Discontinued   Dressing Change Frequency PRN   State of Healing Other (Comment)   % Wound base Red or Granulating 100%  was 99%   Peri-wound Assessment Other (Comment)  calloused   Drainage Amount None   Treatment Cleansed   Wound Properties Date First Assessed: 03/14/14 Time First Assessed: 1930 Wound Type: Diabetic ulcer Location: Other (Comment) , r big toe  Location  Orientation: Right Wound Description (Comments): nonhealing wound on planar aspct of MTP of great toe  Present on Admission: Yes   Dressing Type Impregnated gauze (bismuth)  gauze and medipore tape   Dressing Changed Changed   Dressing Status Old drainage   Dressing Change Frequency Daily  per pt   Site / Wound Assessment Red   % Wound base Red or Granulating 100%  but with callous perimeter preventing approximation   Peri-wound Assessment --  calloused   Margins Epibole (rolled edges)  on the top only; inferior is approximating   Drainage Amount Minimal   Drainage Description Serosanguineous   Treatment Cleansed;Debridement (Selective)   Selective Debridement - Location callous area and surrounding dry skin and slough   Selective Debridement -  Tools Used Forceps;Scalpel   Selective Debridement - Tissue Removed callous; dead skin   Wound Therapy - Clinical Statement inferior portion of Wound Rt plantar MTP appears to be mostly callous that is beginning to break away from skin.  Superior border thick callous with undermining.  Debrided large portion of callous, however difficult today due to frequent leg jerking because of  restless leg.     Wound Therapy - Functional Problem List difficulty walkng   Factors Delaying/Impairing Wound Healing Altered sensation;Diabetes Mellitus;Multiple medical problems;Vascular compromise   Wound Therapy - Frequency --  1 x a week  for 1 more weeks.    Wound Therapy - Current Recommendations PT   Wound Plan continue woundcare.  check wtih patient to ensure appt made with orthotist.    Dressing  xeroform to wound gauze and medipore tape   Decrease Necrotic Tissue to STG: 4 weeks: 0%   Increase Granulation Tissue to STG: 4 weeks 100%   Decrease Length/Width/Depth by (cm) STG: decrease Lt to have no tunnelling ; Rt to be decresed to 1.0x .75 x .1 STG for 4 weeks;  LTG healed 8 weeks   Additional Wound Therapy Goal Pt to state it is easier to walk 8 weeks    Time For Goal Achievement --  continue therapy for 6 more weeks.    Wound Therapy - Potential for Goals Fair  due to comorbidities.  Pt needs continued wound care                              Problem List Patient Active Problem List   Diagnosis Date Noted  . Nonhealing skin ulcer 10/28/2014  . PAD (peripheral artery disease) 10/28/2014  . Bilateral carotid artery disease 07/08/2014  . Critical lower limb ischemia 02/28/2014  . Obstructive sleep apnea 02/12/2014  . Aftercare following surgery of the circulatory system, [place] 12/04/2013  . Carotid stenosis 11/29/2013  . Restless leg syndrome 10/23/2013  . Sleep apnea 10/23/2013  . Obesity (BMI 30.0-34.9) 10/23/2013  . Hyperlipidemia 07/16/2013  . Peripheral arterial  disease 07/16/2013  . Occlusion and stenosis of carotid artery without mention of cerebral infarction 04/24/2012  . History of stroke June 2013 03/14/2012  . Hypertension 03/14/2012  . Type 2 diabetes mellitus with circulatory disorder 03/14/2012  . Peripheral neuropathy 03/14/2012  . Cardiomyopathy- EF NL 5/14 03/14/2012  . ARTHRITIS, RIGHT FOOT 06/26/2008    Teena Irani, PTA/CLT [phone]  04/03/2015, 9:11 AM  [place], [place], [address] Phone: [phone]   Fax:  [phone]

## 2015-04-08 ENCOUNTER — Ambulatory Visit (HOSPITAL_COMMUNITY): Payer: PPO

## 2015-04-08 DIAGNOSIS — T148XXA Other injury of unspecified body region, initial encounter: Secondary | ICD-10-CM

## 2015-04-08 DIAGNOSIS — M6289 Other specified disorders of muscle: Secondary | ICD-10-CM

## 2015-04-08 NOTE — Therapy (Signed)
[place], [place], [address] Phone: [phone]   Fax:  [phone]  Wound Care Therapy  Patient Details  Name: [name] MRN: [id number] Date of Birth: 1946/09/13 Referring Provider:  [name], MD  Encounter Date: 04/08/2015      PT End of Session - 04/08/15 0936    Visit Number 35   Number of Visits 37   Date for PT Re-Evaluation 04/22/15   Authorization Type medicare   Authorization Time Period G-code done 27th visit    Authorization - Visit Number 78   Authorization - Number of Visits 37   PT Start Time 0806   PT Stop Time 0845   PT Time Calculation (min) 39 min   Activity Tolerance Patient tolerated treatment well   Behavior During Therapy [facility] for tasks assessed/performed      Past Medical History  Diagnosis Date  . Hypertension   . Diabetes mellitus   . Carotid artery occlusion   . Stroke March 08, 2012  . Hyperlipidemia   . Peripheral arterial disease     nonhealing ulcers bilaterally on each great toe  . PONV (postoperative nausea and vomiting)   . Arthritis   . Hypothyroidism   . Shortness of breath   . Obstructive sleep apnea     Past Surgical History  Procedure Laterality Date  . Back surgery    . Cervical fusion    . Foot surgery    . Spine surgery    . Eye surgery    . Endarterectomy Left 11/29/2013    Procedure: ENDARTERECTOMY CAROTID;  Surgeon: Serafina Mitchell, MD;  Location: [facility] OR;  Service: Vascular;  Laterality: Left;  . Carotid endarterectomy Left   . Pv angiogram  02/18/2014    tibial vessel diseas bil.  . Angioplasty  02/28/14    diamond back orbital rotational atherectomy of Rt. tibial  . Lower ext duplex doppler  03/14/14    Rt ABI 1.2  . Lower extremity angiogram Bilateral 02/18/2014    Procedure: LOWER EXTREMITY ANGIOGRAM;  Surgeon: [name], MD;  Location: [facility] CATH LAB;  Service: Cardiovascular;  Laterality: Bilateral;  . Lower extremity angiogram  N/A 10/31/2014    Procedure: LOWER EXTREMITY ANGIOGRAM;  Surgeon: [name], MD;  Location: [facility] CATH LAB;  Service: Cardiovascular;  Laterality: N/A;    There were no vitals filed for this visit.  Visit Diagnosis:  Nonhealing nonsurgical wound with necrosis of muscle      Subjective Assessment - 04/08/15 0849    Subjective Pt entered dept with new dressings intact, stated he is waiting to find out if insurance covers orthortist    Currently in Pain? No/denies                   Wound Therapy - 04/08/15 0850    Subjective Pt entered dept with new dressings intact, stated he is waiting to find out if insurance covers orthortist    Patient and Family Stated Goals wounds to heal    Prior Treatments podiatrists self care.   Pressure Ulcer Properties Date First Assessed: 02/25/15 Location: Toe (Comment  which one) Location Orientation: Left Staging: Stage III -  Full thickness tissue loss. Subcutaneous fat may be visible but bone, tendon or muscle are NOT exposed.   Wound Properties Date First Assessed: 03/14/14 Time First Assessed: 1930 Wound Type: Diabetic ulcer Location: Other (Comment) , r big toe  Location Orientation: Right Wound Description (  Comments): nonhealing wound on planar aspct of MTP of great toe  Present on Admission: Yes   Dressing Type Impregnated gauze (bismuth)  xeroform, gauze and medipore tape   Dressing Changed Changed   Dressing Status Old drainage   Dressing Change Frequency Daily   Site / Wound Assessment Red;Pale   % Wound base Red or Granulating 100%   Peri-wound Assessment Other (Comment)  calloused perimeter   Margins Epibole (rolled edges)  superior only, inferier is approximating   Drainage Amount Minimal   Drainage Description Serosanguineous   Treatment Cleansed;Debridement (Selective)   Selective Debridement - Location callous area and surrounding dry skin and slough   Selective Debridement - Tools Used Forceps;Scalpel   Selective  Debridement - Tissue Removed callous; dead skin   Wound Therapy - Clinical Statement Noted increased approximation on inferior portion of wound on Rt plantar MTP, superior portion continues to present with epibole from with callous whole perimeter of wound.  Continued with selective debridement for removal of callous to promote healing.  Xeroform, gauze and medipore tape dressings.  Pt stated he is in contact with insurance for orthotist apt.   Wound Therapy - Functional Problem List difficulty walkng   Factors Delaying/Impairing Wound Healing Altered sensation;Diabetes Mellitus;Multiple medical problems;Vascular compromise   Wound Therapy - Frequency --  1x/week   Wound Therapy - Current Recommendations PT   Wound Plan continue woundcare.  check wtih patient to ensure appt made with orthotist.    Dressing  xeroform to wound gauze and medipore tape            Problem List Patient Active Problem List   Diagnosis Date Noted  . Nonhealing skin ulcer 10/28/2014  . PAD (peripheral artery disease) 10/28/2014  . Bilateral carotid artery disease 07/08/2014  . Critical lower limb ischemia 02/28/2014  . Obstructive sleep apnea 02/12/2014  . Aftercare following surgery of the circulatory system, [place] 12/04/2013  . Carotid stenosis 11/29/2013  . Restless leg syndrome 10/23/2013  . Sleep apnea 10/23/2013  . Obesity (BMI 30.0-34.9) 10/23/2013  . Hyperlipidemia 07/16/2013  . Peripheral arterial disease 07/16/2013  . Occlusion and stenosis of carotid artery without mention of cerebral infarction 04/24/2012  . History of stroke June 2013 03/14/2012  . Hypertension 03/14/2012  . Type 2 diabetes mellitus with circulatory disorder 03/14/2012  . Peripheral neuropathy 03/14/2012  . Cardiomyopathy- EF NL 5/14 03/14/2012  . ARTHRITIS, RIGHT FOOT 06/26/2008   [name], [place]; CBIS [phone]  [name] 04/08/2015, 9:37 AM  [place], [place], [address] Phone: [phone]   Fax:  [phone]

## 2015-04-10 ENCOUNTER — Ambulatory Visit (HOSPITAL_COMMUNITY): Payer: PPO | Admitting: Physical Therapy

## 2015-04-10 DIAGNOSIS — T148XXA Other injury of unspecified body region, initial encounter: Secondary | ICD-10-CM

## 2015-04-10 DIAGNOSIS — M6289 Other specified disorders of muscle: Secondary | ICD-10-CM | POA: Diagnosis not present

## 2015-04-10 NOTE — Therapy (Signed)
[place], [place], [address] Phone: [phone]   Fax:  [phone]  Wound Care Therapy  Patient Details  Name: [name] MRN: [id number] Date of Birth: 1945/09/27 Referring Provider:  [name], MD  Encounter Date: 04/10/2015      PT End of Session - 04/10/15 0850    Visit Number 36   Number of Visits 37   Date for PT Re-Evaluation 04/22/15   Authorization Time Period G-code done 27th visit    Authorization - Visit Number 36   Authorization - Number of Visits 25   PT Start Time 0803   PT Stop Time 0845   PT Time Calculation (min) 42 min   Activity Tolerance Patient tolerated treatment well      Past Medical History  Diagnosis Date  . Hypertension   . Diabetes mellitus   . Carotid artery occlusion   . Stroke March 08, 2012  . Hyperlipidemia   . Peripheral arterial disease     nonhealing ulcers bilaterally on each great toe  . PONV (postoperative nausea and vomiting)   . Arthritis   . Hypothyroidism   . Shortness of breath   . Obstructive sleep apnea     Past Surgical History  Procedure Laterality Date  . Back surgery    . Cervical fusion    . Foot surgery    . Spine surgery    . Eye surgery    . Endarterectomy Left 11/29/2013    Procedure: ENDARTERECTOMY CAROTID;  Surgeon: Serafina Mitchell, MD;  Location: [facility] OR;  Service: Vascular;  Laterality: Left;  . Carotid endarterectomy Left   . Pv angiogram  02/18/2014    tibial vessel diseas bil.  . Angioplasty  02/28/14    diamond back orbital rotational atherectomy of Rt. tibial  . Lower ext duplex doppler  03/14/14    Rt ABI 1.2  . Lower extremity angiogram Bilateral 02/18/2014    Procedure: LOWER EXTREMITY ANGIOGRAM;  Surgeon: [name], MD;  Location: [facility] CATH LAB;  Service: Cardiovascular;  Laterality: Bilateral;  . Lower extremity angiogram N/A 10/31/2014    Procedure: LOWER EXTREMITY ANGIOGRAM;  Surgeon: [name], MD;   Location: [facility] CATH LAB;  Service: Cardiovascular;  Laterality: N/A;    There were no vitals filed for this visit.  Visit Diagnosis:  Nonhealing nonsurgical wound with necrosis of muscle                 Wound Therapy - 04/10/15 0845    Subjective Pt states that his corn is returning.    Patient and Family Stated Goals wounds to heal    Prior Treatments podiatrists self care.   Pressure Ulcer Properties Date First Assessed: 02/25/15 Location: Toe (Comment  which one) Location Orientation: Left Staging: Stage III -  Full thickness tissue loss. Subcutaneous fat may be visible but bone, tendon or muscle are NOT exposed.   Wound Properties Date First Assessed: 03/14/14 Time First Assessed: 1930 Wound Type: Diabetic ulcer Location: Other (Comment) , r big toe  Location Orientation: Right Wound Description (Comments): nonhealing wound on planar aspct of MTP of great toe  Present on Admission: Yes   Dressing Type Silver dressings  xeroform, gauze and medipore tape   Dressing Changed Changed   Dressing Status Old drainage   Dressing Change Frequency Daily   Site / Wound Assessment Red;Pale   % Wound base Red or Granulating 100%   Peri-wound Assessment Edema;Other (  Comment)  calloused perimeter   Wound Length (cm) 1.3 cm   Wound Width (cm) 1.5 cm   Wound Depth (cm) 0.2 cm   Tunneling (cm) from 10 to 2 with largest at 1 o'clock tunnels .5 cm    Margins Epibole (rolled edges)  superior only, inferier is approximating   Drainage Amount Minimal   Drainage Description Serosanguineous   Treatment Cleansed;Debridement (Selective)   Selective Debridement - Location callous area and surrounding dry skin and slough   Selective Debridement - Tools Used Forceps;Scalpel   Selective Debridement - Tissue Removed callous; dead skin   Wound Therapy - Clinical Statement Made a pressure relief donut using orange foam.  Pt instructed to inspect tonight to ensure no skin irritation.    Wound Therapy -  Functional Problem List difficulty walkng   Factors Delaying/Impairing Wound Healing Altered sensation;Diabetes Mellitus;Multiple medical problems;Vascular compromise   Wound Therapy - Frequency --  1x/week   Wound Therapy - Current Recommendations PT   Wound Plan continue woundcare.  check wtih patient to ensure appt made with orthotist.    Dressing  changed dressing to silver as wound has increased in size.  Added pressure relief.                  PT Education - 04/10/15 0849    Education provided Yes   Education Details inspect foot for pressure relief.    Person(s) Educated Patient   Methods Explanation                     Problem List Patient Active Problem List   Diagnosis Date Noted  . Nonhealing skin ulcer 10/28/2014  . PAD (peripheral artery disease) 10/28/2014  . Bilateral carotid artery disease 07/08/2014  . Critical lower limb ischemia 02/28/2014  . Obstructive sleep apnea 02/12/2014  . Aftercare following surgery of the circulatory system, [place] 12/04/2013  . Carotid stenosis 11/29/2013  . Restless leg syndrome 10/23/2013  . Sleep apnea 10/23/2013  . Obesity (BMI 30.0-34.9) 10/23/2013  . Hyperlipidemia 07/16/2013  . Peripheral arterial disease 07/16/2013  . Occlusion and stenosis of carotid artery without mention of cerebral infarction 04/24/2012  . History of stroke June 2013 03/14/2012  . Hypertension 03/14/2012  . Type 2 diabetes mellitus with circulatory disorder 03/14/2012  . Peripheral neuropathy 03/14/2012  . Cardiomyopathy- EF NL 5/14 03/14/2012  . ARTHRITIS, RIGHT FOOT 06/26/2008    Rayetta Humphrey, PT CLT [phone] 04/10/2015, 8:51 AM  [place], [place], [address] Phone: [phone]   Fax:  [phone]

## 2015-04-15 ENCOUNTER — Ambulatory Visit (HOSPITAL_COMMUNITY): Payer: PPO

## 2015-04-15 DIAGNOSIS — T148XXA Other injury of unspecified body region, initial encounter: Secondary | ICD-10-CM

## 2015-04-15 DIAGNOSIS — M6289 Other specified disorders of muscle: Secondary | ICD-10-CM

## 2015-04-15 NOTE — Therapy (Signed)
[place], [place], [address] Phone: [phone]   Fax:  [phone]  Wound Care Therapy  Patient Details  Name: [name] MRN: [id number] Date of Birth: June 15, 1946 Referring Provider:  [name], MD  Encounter Date: 04/15/2015      PT End of Session - 04/15/15 0910    Visit Number 37   Number of Visits 41   Date for PT Re-Evaluation 05/13/15   Authorization Type medicare   Authorization Time Period G-code done 37th visit    Authorization - Visit Number 37   Authorization - Number of Visits 65   PT Start Time 0800   PT Stop Time 0840   PT Time Calculation (min) 40 min   Activity Tolerance Patient tolerated treatment well   Behavior During Therapy [facility] for tasks assessed/performed      Past Medical History  Diagnosis Date  . Hypertension   . Diabetes mellitus   . Carotid artery occlusion   . Stroke March 08, 2012  . Hyperlipidemia   . Peripheral arterial disease     nonhealing ulcers bilaterally on each great toe  . PONV (postoperative nausea and vomiting)   . Arthritis   . Hypothyroidism   . Shortness of breath   . Obstructive sleep apnea     Past Surgical History  Procedure Laterality Date  . Back surgery    . Cervical fusion    . Foot surgery    . Spine surgery    . Eye surgery    . Endarterectomy Left 11/29/2013    Procedure: ENDARTERECTOMY CAROTID;  Surgeon: Serafina Mitchell, MD;  Location: [facility] OR;  Service: Vascular;  Laterality: Left;  . Carotid endarterectomy Left   . Pv angiogram  02/18/2014    tibial vessel diseas bil.  . Angioplasty  02/28/14    diamond back orbital rotational atherectomy of Rt. tibial  . Lower ext duplex doppler  03/14/14    Rt ABI 1.2  . Lower extremity angiogram Bilateral 02/18/2014    Procedure: LOWER EXTREMITY ANGIOGRAM;  Surgeon: [name], MD;  Location: [facility] CATH LAB;  Service: Cardiovascular;  Laterality: Bilateral;  . Lower extremity angiogram  N/A 10/31/2014    Procedure: LOWER EXTREMITY ANGIOGRAM;  Surgeon: [name], MD;  Location[facility] C[facility] LAB;  Service: Cardiovascular;  Laterality: N/A;    There were no vitals filed for this visit.  Visit Diagnosis:  Nonhealing nonsurgical wound with necrosis of muscle      Subjective Assessment - 04/15/15 0855    Subjective Pt reported relief with foam pressure relief, stated corn has returned to lateral 4th toe on Rt foot.  Going to place order at [employer].     Currently in Pain? No/denies                   Wound Therapy - 04/15/15 0856    Subjective Pt reported relief with foam pressure relief, stated corn has returned to lateral 4th toe on Rt foot.  Going to place order at [employer].     Patient and Family Stated Goals wounds to heal    Prior Treatments podiatrists self care.   Pressure Ulcer Properties Date First Assessed: 02/25/15 Location: Toe (Comment  which one) Location Orientation: Left Staging: Stage III -  Full thickness tissue loss. Subcutaneous fat may be visible but bone, tendon or muscle are NOT exposed.   Wound Properties Date First Assessed: 03/14/14 Time First Assessed:  1930 Wound Type: Diabetic ulcer Location: Other (Comment) , r big toe  Location Orientation: Right Wound Description (Comments): nonhealing wound on planar aspct of MTP of great toe  Present on Admission: Yes   Dressing Type Silver dressings  silver dressings, 4x4 foam with pressure relief and gauze   Dressing Changed Changed   Dressing Status Old drainage   Dressing Change Frequency Daily   Site / Wound Assessment Red;Pale   % Wound base Red or Granulating 100%   Peri-wound Assessment Edema;Other (Comment)  callous perimeter   Wound Length (cm) 1.3 cm   Wound Width (cm) 1.5 cm   Wound Depth (cm) 0.2 cm   Tunneling (cm) from 10 to 2 with largest at 1 o'clock tunnels .5cm   Margins Epibole (rolled edges)  superior only, inferior approximated   Drainage Amount  Minimal   Drainage Description Serosanguineous   Treatment Cleansed;Debridement (Selective)   Selective Debridement - Location callous area and surrounding dry skin and slough   Selective Debridement - Tools Used Forceps;Scalpel   Selective Debridement - Tissue Removed callous; dead skin   Wound Therapy - Clinical Statement Encouraged pt to see podiatrist about corn on lateral 4th toe of Rt foot.  Selective debridment focus on removal of callous perimeter of wound to promote healing.  Continued with silver dressings and foam with donut in middle for pressure relief.  No reports of pain through session.   Wound Therapy - Functional Problem List difficulty walkng   Factors Delaying/Impairing Wound Healing Altered sensation;Diabetes Mellitus;Multiple medical problems;Vascular compromise   Hydrotherapy Plan Debridement;Dressing change   Wound Therapy - Frequency --  1x/week   Wound Therapy - Current Recommendations PT   Wound Plan Recommend continuing wound care for 4 more weeks    Dressing  silver, 4x4, foam donut, gauze             Problem List Patient Active Problem List   Diagnosis Date Noted  . Nonhealing skin ulcer 10/28/2014  . PAD (peripheral artery disease) 10/28/2014  . Bilateral carotid artery disease 07/08/2014  . Critical lower limb ischemia 02/28/2014  . Obstructive sleep apnea 02/12/2014  . Aftercare following surgery of the circulatory system, [place] 12/04/2013  . Carotid stenosis 11/29/2013  . Restless leg syndrome 10/23/2013  . Sleep apnea 10/23/2013  . Obesity (BMI 30.0-34.9) 10/23/2013  . Hyperlipidemia 07/16/2013  . Peripheral arterial disease 07/16/2013  . Occlusion and stenosis of carotid artery without mention of cerebral infarction 04/24/2012  . History of stroke June 2013 03/14/2012  . Hypertension 03/14/2012  . Type 2 diabetes mellitus with circulatory disorder 03/14/2012  . Peripheral neuropathy 03/14/2012  . Cardiomyopathy- EF NL 5/14 03/14/2012  .  ARTHRITIS, RIGHT FOOT 06/26/2008   [name], [place]; CBIS [phone]  [name] 04/15/2015, 9:12 AM  [place], [place], [address] Phone: [phone]   Fax:  [phone]

## 2015-04-16 ENCOUNTER — Encounter: Payer: Self-pay | Admitting: Podiatry

## 2015-04-16 ENCOUNTER — Ambulatory Visit (INDEPENDENT_AMBULATORY_CARE_PROVIDER_SITE_OTHER): Payer: PPO | Admitting: Podiatry

## 2015-04-16 VITALS — BP 135/59 | HR 79 | Temp 97.6°F | Resp 14

## 2015-04-16 DIAGNOSIS — L97511 Non-pressure chronic ulcer of other part of right foot limited to breakdown of skin: Secondary | ICD-10-CM

## 2015-04-16 DIAGNOSIS — L89891 Pressure ulcer of other site, stage 1: Secondary | ICD-10-CM

## 2015-04-16 NOTE — Patient Instructions (Signed)
Insert the gel toe separator, stretch first and insert in between the third and fourth toes on the right foot daily, remove at bedtime Okay to apply a small amount of Silvadene cream to the superficial ulcer on the third right toe Continue follow-up for foot ulcer on the plantar aspect of right foot with wound care center We will contact Dr. Luan Pulling for certification for diabetic shoes and office will contact you to measure feet for the shoes  Diabetes and Foot Care Diabetes may cause you to have problems because of poor blood supply (circulation) to your feet and legs. This may cause the skin on your feet to become thinner, break easier, and heal more slowly. Your skin may become dry, and the skin may peel and crack. You may also have nerve damage in your legs and feet causing decreased feeling in them. You may not notice minor injuries to your feet that could lead to infections or more serious problems. Taking care of your feet is one of the most important things you can do for yourself.  HOME CARE INSTRUCTIONS  Wear shoes at all times, even in the house. Do not go barefoot. Bare feet are easily injured.  Check your feet daily for blisters, cuts, and redness. If you cannot see the bottom of your feet, use a mirror or ask someone for help.  Wash your feet with warm water (do not use hot water) and mild soap. Then pat your feet and the areas between your toes until they are completely dry. Do not soak your feet as this can dry your skin.  Apply a moisturizing lotion or petroleum jelly (that does not contain alcohol and is unscented) to the skin on your feet and to dry, brittle toenails. Do not apply lotion between your toes.  Trim your toenails straight across. Do not dig under them or around the cuticle. File the edges of your nails with an emery board or nail file.  Do not cut corns or calluses or try to remove them with medicine.  Wear clean socks or stockings every day. Make sure they are  not too tight. Do not wear knee-high stockings since they may decrease blood flow to your legs.  Wear shoes that fit properly and have enough cushioning. To break in new shoes, wear them for just a few hours a day. This prevents you from injuring your feet. Always look in your shoes before you put them on to be sure there are no objects inside.  Do not cross your legs. This may decrease the blood flow to your feet.  If you find a minor scrape, cut, or break in the skin on your feet, keep it and the skin around it clean and dry. These areas may be cleansed with mild soap and water. Do not cleanse the area with peroxide, alcohol, or iodine.  When you remove an adhesive bandage, be sure not to damage the skin around it.  If you have a wound, look at it several times a day to make sure it is healing.  Do not use heating pads or hot water bottles. They may burn your skin. If you have lost feeling in your feet or legs, you may not know it is happening until it is too late.  Make sure your health care provider performs a complete foot exam at least annually or more often if you have foot problems. Report any cuts, sores, or bruises to your health care provider immediately. SEEK MEDICAL CARE IF:  You have an injury that is not healing.  You have cuts or breaks in the skin.  You have an ingrown nail.  You notice redness on your legs or feet.  You feel burning or tingling in your legs or feet.  You have pain or cramps in your legs and feet.  Your legs or feet are numb.  Your feet always feel cold. SEEK IMMEDIATE MEDICAL CARE IF:   There is increasing redness, swelling, or pain in or around a wound.  There is a red line that goes up your leg.  Pus is coming from a wound.  You develop a fever or as directed by your health care provider.  You notice a bad smell coming from an ulcer or wound. Document Released: 09/03/2000 Document Revised: 05/09/2013 Document Reviewed:  02/13/2013 [facility] Patient Information 2015 [place], [place]. This information is not intended to replace advice given to you by your health care provider. Make sure you discuss any questions you have with your health care provider.

## 2015-04-17 ENCOUNTER — Ambulatory Visit (HOSPITAL_COMMUNITY): Payer: PPO

## 2015-04-17 DIAGNOSIS — M6289 Other specified disorders of muscle: Secondary | ICD-10-CM | POA: Diagnosis not present

## 2015-04-17 DIAGNOSIS — T148XXA Other injury of unspecified body region, initial encounter: Secondary | ICD-10-CM

## 2015-04-17 NOTE — Progress Notes (Signed)
Patient ID: [name], male   DOB: March 30, 1946, 69 y.o.   MRN: [id number]  Subjective: This patient presents today again complaining of a painful keratoses pre-ulcerative skin lesion on the lateral border third right toe. This area was debrided and the visit of 03/12/2015. Patient is also undergoing ongoing wound care at wound care clinic at [facility]. He has a history of peripheral arterial disease, diabetes and open wounds. In addition he would like to obtain certification for diabetic shoes  Objective:   Vascular: DP and PT pulses 0/4 bilaterally PT pulses 0/4 left and 2/4 right  Neurological: Sensation to 10 g monofilament wire impacted intact 2/5 right and 3/5 left Vibratory sensation reactive bilaterally Ankle reflexes reactive bilaterally  Dermatological: Large hyperkeratotic tissue lateral third right toe after debridement breaks down to a 2 mm superficial wound with a granular base no erythema, edema or drainage noted  Plantar right first MPJ has 15 mm eidetic ulcer with a granular base without any surrounding erythema, edema, warmth noted  Musculoskeletal: HAV deformity left Hammertoes 2 bilaterally\  Assessment: Superficial ulceration third right toe without clinical sign of infection Noninfected plantar skin ulcer right first MPJ under care wound care center  Plan: Debrided ulcer in hyperkeratotic tissue over the lateral border third right toe Apply some Silvadene cream to the will to the lateral border third right toe Dispensed a silicone gel toe separator to place him between third and fourth right toes and demonstrated application of this device  Obtain certification for diabetic shoes from Dr. [name] in [place] for the following indication: Type II diabetic with peripheral neuropathy and angiopathy Hammertoe deformities second bilaterally HAV deformity left Ulceration plantar right first MPJ Peripheral neuropathy with evidence of callus  formation Loss of vibratory sensation Pre-ulcerative callus third right toe  Contact patient after certification is obtained

## 2015-04-17 NOTE — Therapy (Signed)
[place], [place], [address] Phone: [phone]   Fax:  [phone]  Wound Care Therapy  Patient Details  Name: [name] MRN: [id number] Date of Birth: 09/05/1946 Referring Provider:  [name], MD  Encounter Date: 04/17/2015      PT End of Session - 04/17/15 0859    Visit Number 38   Number of Visits 41   Date for PT Re-Evaluation 05/13/15   Authorization Type medicare   Authorization Time Period G-code done 37th visit    Authorization - Visit Number 38   Authorization - Number of Visits 62   PT Start Time 0801   PT Stop Time 0835   PT Time Calculation (min) 34 min   Activity Tolerance Patient tolerated treatment well   Behavior During Therapy [facility] for tasks assessed/performed      Past Medical History  Diagnosis Date  . Hypertension   . Diabetes mellitus   . Carotid artery occlusion   . Stroke March 08, 2012  . Hyperlipidemia   . Peripheral arterial disease     nonhealing ulcers bilaterally on each great toe  . PONV (postoperative nausea and vomiting)   . Arthritis   . Hypothyroidism   . Shortness of breath   . Obstructive sleep apnea     Past Surgical History  Procedure Laterality Date  . Back surgery    . Cervical fusion    . Foot surgery    . Spine surgery    . Eye surgery    . Endarterectomy Left 11/29/2013    Procedure: ENDARTERECTOMY CAROTID;  Surgeon: Serafina Mitchell, MD;  Location: [facility] OR;  Service: Vascular;  Laterality: Left;  . Carotid endarterectomy Left   . Pv angiogram  02/18/2014    tibial vessel diseas bil.  . Angioplasty  02/28/14    diamond back orbital rotational atherectomy of Rt. tibial  . Lower ext duplex doppler  03/14/14    Rt ABI 1.2  . Lower extremity angiogram Bilateral 02/18/2014    Procedure: LOWER EXTREMITY ANGIOGRAM;  Surgeon: [name], MD;  Location: [facility] CATH LAB;  Service: Cardiovascular;  Laterality: Bilateral;  . Lower extremity angiogram  N/A 10/31/2014    Procedure: LOWER EXTREMITY ANGIOGRAM;  Surgeon: [name], MD;  Location: [facility] CATH LAB;  Service: Cardiovascular;  Laterality: N/A;    There were no vitals filed for this visit.  Visit Diagnosis:  Nonhealing nonsurgical wound with necrosis of muscle      Subjective Assessment - 04/17/15 0838    Subjective Pt reported he went to podiarist and had core removed, also set up pressure relief shoes through podiatrist.  Pt feels unstable at entrance today   Currently in Pain? No/denies             Wound Therapy - 04/17/15 0840    Subjective Pt reported he went to podiarist and had core removed, stated it has been 1 month since last corn removed.  Also set up pressure relief shoes through podiatrist.  Pt feels unstable at entrance today   Patient and Family Stated Goals wounds to heal    Prior Treatments podiatrists self care.   Pressure Ulcer Properties Date First Assessed: 02/25/15 Location: Toe (Comment  which one) Location Orientation: Left Staging: Stage III -  Full thickness tissue loss. Subcutaneous fat may be visible but bone, tendon or muscle are NOT exposed.   Wound Properties Date First Assessed: 03/14/14 Time First Assessed: 1930  Wound Type: Diabetic ulcer Location: Other (Comment) , r big toe  Location Orientation: Right Wound Description (Comments): nonhealing wound on planar aspct of MTP of great toe  Present on Admission: Yes   Dressing Type Silver dressings  silver, vaseline perimeter, 4x4, foam, and gauze   Dressing Changed Changed   Dressing Status Old drainage   Dressing Change Frequency Daily   Site / Wound Assessment Red;Pale   % Wound base Red or Granulating 100%   Peri-wound Assessment Edema;Other (Comment)  calous perimeter   Margins Epibole (rolled edges)  superior only, interfer approximated   Drainage Amount Minimal   Drainage Description Serosanguineous   Treatment Cleansed;Debridement (Selective)   Selective Debridement - Location  callous area and surrounding dry skin and slough   Selective Debridement - Tools Used Forceps;Scalpel   Selective Debridement - Tissue Removed callous; dead skin   Wound Therapy - Clinical Statement Continued selective debridement with main focus on removal of callous perimeter of wound.  Pt continues to epibole superior border with undermining from 10 to 1 oclock.  Silver dressings places with vaseline perimeter of wound with foam placed surrounding and gauze.  No reports of pain through session.   Wound Therapy - Functional Problem List difficulty walkng   Factors Delaying/Impairing Wound Healing Altered sensation;Diabetes Mellitus;Multiple medical problems;Vascular compromise   Hydrotherapy Plan Debridement;Dressing change   Wound Therapy - Frequency --  1x/ week   Wound Therapy - Current Recommendations PT   Wound Plan Continue with current PT POC   Dressing  silver, 4x4, foam donut, gauze            Problem List Patient Active Problem List   Diagnosis Date Noted  . Nonhealing skin ulcer 10/28/2014  . PAD (peripheral artery disease) 10/28/2014  . Bilateral carotid artery disease 07/08/2014  . Critical lower limb ischemia 02/28/2014  . Obstructive sleep apnea 02/12/2014  . Aftercare following surgery of the circulatory system, [place] 12/04/2013  . Carotid stenosis 11/29/2013  . Restless leg syndrome 10/23/2013  . Sleep apnea 10/23/2013  . Obesity (BMI 30.0-34.9) 10/23/2013  . Hyperlipidemia 07/16/2013  . Peripheral arterial disease 07/16/2013  . Occlusion and stenosis of carotid artery without mention of cerebral infarction 04/24/2012  . History of stroke June 2013 03/14/2012  . Hypertension 03/14/2012  . Type 2 diabetes mellitus with circulatory disorder 03/14/2012  . Peripheral neuropathy 03/14/2012  . Cardiomyopathy- EF NL 5/14 03/14/2012  . ARTHRITIS, RIGHT FOOT 06/26/2008   [name], LPTA; CBIS [phone]  [name] 04/17/2015, 9:00 AM  [place], [place], [address] Phone: [phone]   Fax:  [phone]

## 2015-04-23 ENCOUNTER — Ambulatory Visit (HOSPITAL_COMMUNITY): Payer: PPO | Attending: Podiatry | Admitting: Physical Therapy

## 2015-04-23 DIAGNOSIS — M6289 Other specified disorders of muscle: Secondary | ICD-10-CM | POA: Diagnosis not present

## 2015-04-23 DIAGNOSIS — T148XXA Other injury of unspecified body region, initial encounter: Secondary | ICD-10-CM

## 2015-04-23 DIAGNOSIS — T148 Other injury of unspecified body region: Secondary | ICD-10-CM | POA: Insufficient documentation

## 2015-04-23 NOTE — Therapy (Signed)
[place], [place], [address] Phone: [phone]   Fax:  [phone]  Wound Care Therapy  Patient Details  Name: [name] MRN: [id number] Date of Birth: 1945/10/27 Referring Provider:  [name], MD  Encounter Date: 04/23/2015      PT End of Session - 04/23/15 1702    Visit Number 72   Number of Visits 41   Date for PT Re-Evaluation 05/13/15   Authorization Type medicare   Authorization Time Period G-code done 37th visit    Authorization - Visit Number 39   Authorization - Number of Visits 6   PT Start Time 1430   PT Stop Time 1516   PT Time Calculation (min) 46 min   Activity Tolerance Patient tolerated treatment well   Behavior During Therapy [facility] for tasks assessed/performed      Past Medical History  Diagnosis Date  . Hypertension   . Diabetes mellitus   . Carotid artery occlusion   . Stroke March 08, 2012  . Hyperlipidemia   . Peripheral arterial disease     nonhealing ulcers bilaterally on each great toe  . PONV (postoperative nausea and vomiting)   . Arthritis   . Hypothyroidism   . Shortness of breath   . Obstructive sleep apnea     Past Surgical History  Procedure Laterality Date  . Back surgery    . Cervical fusion    . Foot surgery    . Spine surgery    . Eye surgery    . Endarterectomy Left 11/29/2013    Procedure: ENDARTERECTOMY CAROTID;  Surgeon: Serafina Mitchell, MD;  Location: [facility] OR;  Service: Vascular;  Laterality: Left;  . Carotid endarterectomy Left   . Pv angiogram  02/18/2014    tibial vessel diseas bil.  . Angioplasty  02/28/14    diamond back orbital rotational atherectomy of Rt. tibial  . Lower ext duplex doppler  03/14/14    Rt ABI 1.2  . Lower extremity angiogram Bilateral 02/18/2014    Procedure: LOWER EXTREMITY ANGIOGRAM;  Surgeon: [name], MD;  Location: [facility] CATH LAB;  Service: Cardiovascular;  Laterality: Bilateral;  . Lower extremity angiogram  N/A 10/31/2014    Procedure: LOWER EXTREMITY ANGIOGRAM;  Surgeon: [name], MD;  Location: [facility] CATH LAB;  Service: Cardiovascular;  Laterality: N/A;    There were no vitals filed for this visit.  Visit Diagnosis:  Nonhealing nonsurgical wound with necrosis of muscle      Subjective Assessment - 04/23/15 1655    Subjective Patient reports he is doing well today, still working on getting pressure relief shoes                    Wound Therapy - 04/23/15 1656    Subjective Patient reports that he is still working on getting pressure relief shoes from MD; otherwise doing OK   Patient and Family Stated Goals wounds to heal    Prior Treatments podiatrists self care.   Evaluation and Treatment Procedures Explained to Patient/Family Yes   Evaluation and Treatment Procedures agreed to   Pressure Ulcer Properties Date First Assessed: 02/25/15 Location: Toe (Comment  which one) Location Orientation: Left Staging: Stage III -  Full thickness tissue loss. Subcutaneous fat may be visible but bone, tendon or muscle are NOT exposed.   Wound Properties Date First Assessed: 03/14/14 Time First Assessed: 1930 Wound Type: Diabetic ulcer Location: Other (Comment) , r big toe  Location Orientation: Right Wound Description (Comments): nonhealing wound on planar aspct of MTP of great toe  Present on Admission: Yes   Dressing Type Silver dressings  silver, vaseline perimeter, 4x4, foam, and gauze   Dressing Changed Changed   Dressing Status Old drainage   Dressing Change Frequency Daily   Site / Wound Assessment Red;Pale   % Wound base Red or Granulating 100%   Peri-wound Assessment Edema;Other (Comment)  callous    Margins Epibole (rolled edges)   Drainage Amount Minimal   Drainage Description Serosanguineous   Treatment Cleansed;Debridement (Selective);Other (Comment)  honey, vasoline, donut, gauze    Selective Debridement - Location callous area and surrounding dry skin and slough    Selective Debridement - Tools Used Forceps;Scalpel   Selective Debridement - Tissue Removed callous; dead skin   Wound Therapy - Clinical Statement Patient presents today with no pain. No slough or odor noted when dressing removed today so switched back to trial of honey- may go back to silver if honey does not appear to be working as well. Debriding callous today, also noted continued underminnig from 10-1. Placed donut over area of wound to remove pressure.    Wound Therapy - Functional Problem List difficulty walkng   Factors Delaying/Impairing Wound Healing Altered sensation;Diabetes Mellitus;Multiple medical problems;Vascular compromise   Hydrotherapy Plan Debridement;Dressing change   Wound Therapy - Current Recommendations PT   Wound Plan Continue with current PT POC   Dressing  honey, gauze, foam donut                 PT Education - 04/23/15 1702    Education provided Yes   Education Details purpose of foam donut    Person(s) Educated Patient   Methods Explanation   Comprehension Verbalized understanding                     Problem List Patient Active Problem List   Diagnosis Date Noted  . Nonhealing skin ulcer 10/28/2014  . PAD (peripheral artery disease) 10/28/2014  . Bilateral carotid artery disease 07/08/2014  . Critical lower limb ischemia 02/28/2014  . Obstructive sleep apnea 02/12/2014  . Aftercare following surgery of the circulatory system, [place] 12/04/2013  . Carotid stenosis 11/29/2013  . Restless leg syndrome 10/23/2013  . Sleep apnea 10/23/2013  . Obesity (BMI 30.0-34.9) 10/23/2013  . Hyperlipidemia 07/16/2013  . Peripheral arterial disease 07/16/2013  . Occlusion and stenosis of carotid artery without mention of cerebral infarction 04/24/2012  . History of stroke June 2013 03/14/2012  . Hypertension 03/14/2012  . Type 2 diabetes mellitus with circulatory disorder 03/14/2012  . Peripheral neuropathy 03/14/2012  . Cardiomyopathy- EF  NL 5/14 03/14/2012  . ARTHRITIS, RIGHT FOOT 06/26/2008    Deniece Ree PT, DPT [place] [address] [place], [place], [address] Phone: [phone]   Fax:  [phone]

## 2015-04-25 ENCOUNTER — Ambulatory Visit (HOSPITAL_COMMUNITY): Payer: PPO

## 2015-04-25 DIAGNOSIS — M6289 Other specified disorders of muscle: Secondary | ICD-10-CM | POA: Diagnosis not present

## 2015-04-25 DIAGNOSIS — T148XXA Other injury of unspecified body region, initial encounter: Secondary | ICD-10-CM

## 2015-04-25 NOTE — Therapy (Signed)
[place], [place], [address] Phone: [phone]   Fax:  [phone]  Wound Care Therapy  Patient Details  Name: [name] MRN: [id number] Date of Birth: 07/04/1946 Referring Provider:  [name], MD  Encounter Date: 04/25/2015      PT End of Session - 04/25/15 1407    Visit Number 40   Number of Visits 41   Date for PT Re-Evaluation 05/13/15   Authorization Type medicare   Authorization Time Period G-code done 37th visit    Authorization - Visit Number 70   Authorization - Number of Visits 75   PT Start Time 0800   PT Stop Time 0840   PT Time Calculation (min) 40 min   Activity Tolerance Patient tolerated treatment well   Behavior During Therapy [facility] for tasks assessed/performed      Past Medical History  Diagnosis Date  . Hypertension   . Diabetes mellitus   . Carotid artery occlusion   . Stroke March 08, 2012  . Hyperlipidemia   . Peripheral arterial disease     nonhealing ulcers bilaterally on each great toe  . PONV (postoperative nausea and vomiting)   . Arthritis   . Hypothyroidism   . Shortness of breath   . Obstructive sleep apnea     Past Surgical History  Procedure Laterality Date  . Back surgery    . Cervical fusion    . Foot surgery    . Spine surgery    . Eye surgery    . Endarterectomy Left 11/29/2013    Procedure: ENDARTERECTOMY CAROTID;  Surgeon: Serafina Mitchell, MD;  Location: [facility] OR;  Service: Vascular;  Laterality: Left;  . Carotid endarterectomy Left   . Pv angiogram  02/18/2014    tibial vessel diseas bil.  . Angioplasty  02/28/14    diamond back orbital rotational atherectomy of Rt. tibial  . Lower ext duplex doppler  03/14/14    Rt ABI 1.2  . Lower extremity angiogram Bilateral 02/18/2014    Procedure: LOWER EXTREMITY ANGIOGRAM;  Surgeon: [name], MD;  Location: [facility] CATH LAB;  Service: Cardiovascular;  Laterality: Bilateral;  . Lower extremity angiogram  N/A 10/31/2014    Procedure: LOWER EXTREMITY ANGIOGRAM;  Surgeon: [name], MD;  Location: [facility] CATH LAB;  Service: Cardiovascular;  Laterality: N/A;    There were no vitals filed for this visit.  Visit Diagnosis:  Nonhealing nonsurgical wound with necrosis of muscle      Subjective Assessment - 04/25/15 1120    Subjective No reports of pain, dressing intact today   Currently in Pain? No/denies           Wound Therapy - 04/25/15 1120    Subjective No reports of pain, dressing intact today   Patient and Family Stated Goals wounds to heal    Prior Treatments podiatrists self care.   Pain Assessment No/denies pain   Pressure Ulcer Properties Date First Assessed: 02/25/15 Location: Toe (Comment  which one) Location Orientation: Left Staging: Stage III -  Full thickness tissue loss. Subcutaneous fat may be visible but bone, tendon or muscle are NOT exposed.   Wound Properties Date First Assessed: 03/14/14 Time First Assessed: 1930 Wound Type: Diabetic ulcer Location: Other (Comment) , r big toe  Location Orientation: Right Wound Description (Comments): nonhealing wound on planar aspct of MTP of great toe  Present on Admission: Yes   Dressing Type Silver dressings  Silver, 2x2, foam,  vaseline perimeter, 4x4, medipore tape   Dressing Changed Changed   Dressing Status Old drainage   Dressing Change Frequency Daily   Site / Wound Assessment Red;Pale   % Wound base Red or Granulating 100%   % Wound base Yellow 0%   Peri-wound Assessment Edema;Other (Comment)  callous   Margins Epibole (rolled edges)  superior only, approximated inferior   Drainage Amount Scant   Drainage Description Serosanguineous   Treatment Cleansed;Debridement (Selective)   Selective Debridement - Location callous area and surrounding dry skin and slough   Selective Debridement - Tools Used Forceps;Scalpel   Selective Debridement - Tissue Removed callous; dead skin   Wound Therapy - Clinical Statement Pt  with improved approximation with tunneling on superior aspect of wound.  Selective debirdment for removal of callous to promote healing.  Resume silver with foam for pressure relief. No noted edema this session, no compression on foot.  No reports of pain through session.     Wound Therapy - Functional Problem List difficulty walkng   Factors Delaying/Impairing Wound Healing Altered sensation;Diabetes Mellitus;Multiple medical problems;Vascular compromise   Hydrotherapy Plan Debridement;Dressing change   Wound Therapy - Frequency Other (comment)  1x/week   Wound Therapy - Current Recommendations PT   Wound Plan Continue with current PT POC   Dressing  Silver, 2x2, foam, vaseline perimeter, 4x4, medipore tape       Problem List Patient Active Problem List   Diagnosis Date Noted  . Nonhealing skin ulcer 10/28/2014  . PAD (peripheral artery disease) 10/28/2014  . Bilateral carotid artery disease 07/08/2014  . Critical lower limb ischemia 02/28/2014  . Obstructive sleep apnea 02/12/2014  . Aftercare following surgery of the circulatory system, [place] 12/04/2013  . Carotid stenosis 11/29/2013  . Restless leg syndrome 10/23/2013  . Sleep apnea 10/23/2013  . Obesity (BMI 30.0-34.9) 10/23/2013  . Hyperlipidemia 07/16/2013  . Peripheral arterial disease 07/16/2013  . Occlusion and stenosis of carotid artery without mention of cerebral infarction 04/24/2012  . History of stroke June 2013 03/14/2012  . Hypertension 03/14/2012  . Type 2 diabetes mellitus with circulatory disorder 03/14/2012  . Peripheral neuropathy 03/14/2012  . Cardiomyopathy- EF NL 5/14 03/14/2012  . ARTHRITIS, RIGHT FOOT 06/26/2008   [name], [place]; [place]  [name] 04/25/2015, 2:08 PM  [place] [address] [place], [place], [address] Phone: [phone]   Fax:  [phone]

## 2015-04-29 ENCOUNTER — Ambulatory Visit (HOSPITAL_COMMUNITY): Payer: PPO | Admitting: Physical Therapy

## 2015-04-29 DIAGNOSIS — T148XXA Other injury of unspecified body region, initial encounter: Secondary | ICD-10-CM

## 2015-04-29 DIAGNOSIS — M6289 Other specified disorders of muscle: Secondary | ICD-10-CM | POA: Diagnosis not present

## 2015-04-29 NOTE — Therapy (Signed)
[place], [place], [address] Phone: [phone]   Fax:  [phone]  Wound Care Therapy  Patient Details  Name: [name] MRN: [id number] Date of Birth: 1946-03-20 Referring Provider:  [name], MD  Encounter Date: 04/29/2015      PT End of Session - 04/29/15 0914    Visit Number 41   Number of Visits 68   Date for PT Re-Evaluation 05/13/15   Authorization Type medicare   Authorization Time Period G-code done 37th visit    Authorization - Visit Number 3   Authorization - Number of Visits 49   PT Start Time 0805   PT Stop Time 0835   PT Time Calculation (min) 30 min   Activity Tolerance Patient tolerated treatment well   Behavior During Therapy [facility] for tasks assessed/performed      Past Medical History  Diagnosis Date  . Hypertension   . Diabetes mellitus   . Carotid artery occlusion   . Stroke March 08, 2012  . Hyperlipidemia   . Peripheral arterial disease     nonhealing ulcers bilaterally on each great toe  . PONV (postoperative nausea and vomiting)   . Arthritis   . Hypothyroidism   . Shortness of breath   . Obstructive sleep apnea     Past Surgical History  Procedure Laterality Date  . Back surgery    . Cervical fusion    . Foot surgery    . Spine surgery    . Eye surgery    . Endarterectomy Left 11/29/2013    Procedure: ENDARTERECTOMY CAROTID;  Surgeon: Serafina Mitchell, MD;  Location: [facility] OR;  Service: Vascular;  Laterality: Left;  . Carotid endarterectomy Left   . Pv angiogram  02/18/2014    tibial vessel diseas bil.  . Angioplasty  02/28/14    diamond back orbital rotational atherectomy of Rt. tibial  . Lower ext duplex doppler  03/14/14    Rt ABI 1.2  . Lower extremity angiogram Bilateral 02/18/2014    Procedure: LOWER EXTREMITY ANGIOGRAM;  Surgeon: [name], MD;  Location: [facility] CATH LAB;  Service: Cardiovascular;  Laterality: Bilateral;  . Lower extremity angiogram  N/A 10/31/2014    Procedure: LOWER EXTREMITY ANGIOGRAM;  Surgeon: [name], MD;  Location: [facility] CATH LAB;  Service: Cardiovascular;  Laterality: N/A;    There were no vitals filed for this visit.  Visit Diagnosis:  Nonhealing nonsurgical wound with necrosis of muscle                 Wound Therapy - 04/29/15 0909    Subjective Pt still has not heard back from podiatrist regarding insoles/shoes.  Reports no pain today.   Patient and Family Stated Goals wounds to heal    Prior Treatments podiatrists self care.   Pain Assessment No/denies pain   Pressure Ulcer Properties Date First Assessed: 02/25/15 Location: Toe (Comment  which one) Location Orientation: Left Staging: Stage III -  Full thickness tissue loss. Subcutaneous fat may be visible but bone, tendon or muscle are NOT exposed.   Wound Properties Date First Assessed: 03/14/14 Time First Assessed: 1930 Wound Type: Diabetic ulcer Location: Other (Comment) , r big toe  Location Orientation: Right Wound Description (Comments): nonhealing wound on planar aspct of MTP of great toe  Present on Admission: Yes   Dressing Type Silver dressings  Silver, 2x2, foam, vaseline perimeter, 4x4, medipore tape   Dressing Changed Changed   Dressing  Status Old drainage   Dressing Change Frequency Daily   Site / Wound Assessment Red;Pale   % Wound base Red or Granulating 100%   % Wound base Yellow 0%   Peri-wound Assessment Other (Comment)  callous   Margins Epibole (rolled edges)  superior only, approximated inferior   Drainage Amount Scant   Drainage Description Serosanguineous   Treatment Cleansed;Debridement (Selective)   Selective Debridement - Location callous area and surrounding dry skin and slough   Selective Debridement - Tools Used Forceps;Scalpel   Selective Debridement - Tissue Removed callous; dead skin   Wound Therapy - Clinical Statement Debrided away undermining on superior aspect of wound.  Overall reduction of  callous with ntoed approximation inferior border.  Continued with dense foam on plantar surface, however removed more area around wound to decrease pressure on wound borders.  Pt reported comfort at end of session.    Wound Therapy - Functional Problem List difficulty walkng   Factors Delaying/Impairing Wound Healing Altered sensation;Diabetes Mellitus;Multiple medical problems;Vascular compromise   Hydrotherapy Plan Debridement;Dressing change   Wound Therapy - Frequency Other (comment)  1x/week   Wound Therapy - Current Recommendations PT   Wound Plan Continue with current PT POC   Dressing  Silver, 2x2, foam, vaseline perimeter, 4x4, medipore tape                              Problem List Patient Active Problem List   Diagnosis Date Noted  . Nonhealing skin ulcer 10/28/2014  . PAD (peripheral artery disease) 10/28/2014  . Bilateral carotid artery disease 07/08/2014  . Critical lower limb ischemia 02/28/2014  . Obstructive sleep apnea 02/12/2014  . Aftercare following surgery of the circulatory system, [place] 12/04/2013  . Carotid stenosis 11/29/2013  . Restless leg syndrome 10/23/2013  . Sleep apnea 10/23/2013  . Obesity (BMI 30.0-34.9) 10/23/2013  . Hyperlipidemia 07/16/2013  . Peripheral arterial disease 07/16/2013  . Occlusion and stenosis of carotid artery without mention of cerebral infarction 04/24/2012  . History of stroke June 2013 03/14/2012  . Hypertension 03/14/2012  . Type 2 diabetes mellitus with circulatory disorder 03/14/2012  . Peripheral neuropathy 03/14/2012  . Cardiomyopathy- EF NL 5/14 03/14/2012  . ARTHRITIS, RIGHT FOOT 06/26/2008    Teena Irani, PTA/CLT [phone]  04/29/2015, 9:15 AM  [place], [place], [address] Phone: [phone]   Fax:  [phone]

## 2015-05-01 ENCOUNTER — Ambulatory Visit: Payer: PPO | Admitting: *Deleted

## 2015-05-01 ENCOUNTER — Ambulatory Visit (HOSPITAL_COMMUNITY): Payer: PPO

## 2015-05-01 DIAGNOSIS — T148XXA Other injury of unspecified body region, initial encounter: Secondary | ICD-10-CM

## 2015-05-01 DIAGNOSIS — L97519 Non-pressure chronic ulcer of other part of right foot with unspecified severity: Secondary | ICD-10-CM

## 2015-05-01 DIAGNOSIS — M6289 Other specified disorders of muscle: Secondary | ICD-10-CM

## 2015-05-01 DIAGNOSIS — L97529 Non-pressure chronic ulcer of other part of left foot with unspecified severity: Secondary | ICD-10-CM

## 2015-05-01 DIAGNOSIS — E08621 Diabetes mellitus due to underlying condition with foot ulcer: Secondary | ICD-10-CM

## 2015-05-01 NOTE — Therapy (Signed)
[place], [place], [address] Phone: [phone]   Fax:  [phone]  Physical Therapy Treatment  Patient Details  Name: [name] MRN: [id number] Date of Birth: Feb 05, 1946 Referring Provider:  [name], MD  Encounter Date: 05/01/2015      PT End of Session - 05/01/15 0851    Visit Number 42   Number of Visits 26   Date for PT Re-Evaluation 05/13/15   Authorization Type medicare   Authorization Time Period G-code done 37th visit    Authorization - Visit Number 42   Authorization - Number of Visits 56   PT Start Time 0802   PT Stop Time 0840   PT Time Calculation (min) 38 min   Activity Tolerance Patient tolerated treatment well   Behavior During Therapy [facility] for tasks assessed/performed      Past Medical History  Diagnosis Date  . Hypertension   . Diabetes mellitus   . Carotid artery occlusion   . Stroke March 08, 2012  . Hyperlipidemia   . Peripheral arterial disease     nonhealing ulcers bilaterally on each great toe  . PONV (postoperative nausea and vomiting)   . Arthritis   . Hypothyroidism   . Shortness of breath   . Obstructive sleep apnea     Past Surgical History  Procedure Laterality Date  . Back surgery    . Cervical fusion    . Foot surgery    . Spine surgery    . Eye surgery    . Endarterectomy Left 11/29/2013    Procedure: ENDARTERECTOMY CAROTID;  Surgeon: Serafina Mitchell, MD;  Location: [facility] OR;  Service: Vascular;  Laterality: Left;  . Carotid endarterectomy Left   . Pv angiogram  02/18/2014    tibial vessel diseas bil.  . Angioplasty  02/28/14    diamond back orbital rotational atherectomy of Rt. tibial  . Lower ext duplex doppler  03/14/14    Rt ABI 1.2  . Lower extremity angiogram Bilateral 02/18/2014    Procedure: LOWER EXTREMITY ANGIOGRAM;  Surgeon: [name], MD;  Location: [facility] CATH LAB;  Service: Cardiovascular;  Laterality: Bilateral;  . Lower extremity  angiogram N/A 10/31/2014    Procedure: LOWER EXTREMITY ANGIOGRAM;  Surgeon: [name], MD;  Location: [facility] CATH LAB;  Service: Cardiovascular;  Laterality: N/A;    There were no vitals filed for this visit.  Visit Diagnosis:  Nonhealing nonsurgical wound with necrosis of muscle      Subjective Assessment - 05/01/15 0840    Subjective No pain, changed dressings with new dressings intact today.     Currently in Pain? No/denies           Wound Therapy - 05/01/15 0842    Subjective No pain, changed dressings with new dressings intact today.     Patient and Family Stated Goals wounds to heal    Prior Treatments podiatrists self care.   Pain Assessment No/denies pain   Pressure Ulcer Properties Date First Assessed: 02/25/15 Location: Toe (Comment  which one) Location Orientation: Left Staging: Stage III -  Full thickness tissue loss. Subcutaneous fat may be visible but bone, tendon or muscle are NOT exposed.   Wound Properties Date First Assessed: 03/14/14 Time First Assessed: 1930 Wound Type: Diabetic ulcer Location: Other (Comment) , r big toe  Location Orientation: Right Wound Description (Comments): nonhealing wound on planar aspct of MTP of great toe  Present on Admission: Yes  Dressing Type Silver dressings  Silver, 2x2, foam, vaseline, medipore tape   Dressing Changed Changed   Dressing Status Old drainage   Dressing Change Frequency Daily   Site / Wound Assessment Red;Pale   % Wound base Red or Granulating 100%   % Wound base Yellow 0%   Peri-wound Assessment --  callous   Margins Attached edges (approximated)   Drainage Amount Scant   Drainage Description Serosanguineous   Treatment Cleansed;Debridement (Selective)   Selective Debridement - Location callous area and surrounding dry skin and slough   Selective Debridement - Tools Used Forceps;Scalpel   Selective Debridement - Tissue Removed callous; dead skin   Wound Therapy - Clinical Statement Improving  approximation superior border of wound.  Noted increased blood on dressings upon removal of dressings.  Continued selectived debridement for removal of callous wound perimeter.  Continued with silver dressings with vaseline apprlied surrounding wound.  No reports of pain through session, pt able to sleep during debridement.   Wound Therapy - Functional Problem List difficulty walkng   Factors Delaying/Impairing Wound Healing Altered sensation;Diabetes Mellitus;Multiple medical problems;Vascular compromise   Hydrotherapy Plan Debridement;Dressing change   Wound Therapy - Frequency Other (comment)   Wound Therapy - Current Recommendations PT   Wound Plan Continue with current PT POC   Dressing  Silver, 2x2, foam, vaseline perimeter, 4x4, medipore tape          Problem List Patient Active Problem List   Diagnosis Date Noted  . Nonhealing skin ulcer 10/28/2014  . PAD (peripheral artery disease) 10/28/2014  . Bilateral carotid artery disease 07/08/2014  . Critical lower limb ischemia 02/28/2014  . Obstructive sleep apnea 02/12/2014  . Aftercare following surgery of the circulatory system, [place] 12/04/2013  . Carotid stenosis 11/29/2013  . Restless leg syndrome 10/23/2013  . Sleep apnea 10/23/2013  . Obesity (BMI 30.0-34.9) 10/23/2013  . Hyperlipidemia 07/16/2013  . Peripheral arterial disease 07/16/2013  . Occlusion and stenosis of carotid artery without mention of cerebral infarction 04/24/2012  . History of stroke June 2013 03/14/2012  . Hypertension 03/14/2012  . Type 2 diabetes mellitus with circulatory disorder 03/14/2012  . Peripheral neuropathy 03/14/2012  . Cardiomyopathy- EF NL 5/14 03/14/2012  . ARTHRITIS, RIGHT FOOT 06/26/2008   [name], [place]; [place]  [name] 05/01/2015, 8:51 AM  [place], [place], [address] Phone: [phone]   Fax:  [phone]

## 2015-05-06 ENCOUNTER — Ambulatory Visit (HOSPITAL_COMMUNITY): Payer: PPO | Admitting: Physical Therapy

## 2015-05-06 DIAGNOSIS — T148XXA Other injury of unspecified body region, initial encounter: Secondary | ICD-10-CM

## 2015-05-06 DIAGNOSIS — M6289 Other specified disorders of muscle: Secondary | ICD-10-CM

## 2015-05-06 NOTE — Therapy (Signed)
[place], [place], [address] Phone: [phone]   Fax:  [phone]  Wound Care Therapy  Patient Details  Name: [name] MRN: [id number] Date of Birth: 1946-08-10 Referring Provider:  [name], MD  Encounter Date: 05/06/2015      PT End of Session - 05/06/15 0922    Visit Number 10   Number of Visits 26   Date for PT Re-Evaluation 05/13/15   Authorization Type medicare   Authorization Time Period G-code done 37th visit    Authorization - Visit Number 40   Authorization - Number of Visits 15   PT Start Time 0802   PT Stop Time 0850   PT Time Calculation (min) 48 min   Activity Tolerance Patient tolerated treatment well      Past Medical History  Diagnosis Date  . Hypertension   . Diabetes mellitus   . Carotid artery occlusion   . Stroke March 08, 2012  . Hyperlipidemia   . Peripheral arterial disease     nonhealing ulcers bilaterally on each great toe  . PONV (postoperative nausea and vomiting)   . Arthritis   . Hypothyroidism   . Shortness of breath   . Obstructive sleep apnea     Past Surgical History  Procedure Laterality Date  . Back surgery    . Cervical fusion    . Foot surgery    . Spine surgery    . Eye surgery    . Endarterectomy Left 11/29/2013    Procedure: ENDARTERECTOMY CAROTID;  Surgeon: Serafina Mitchell, MD;  Location: [facility] OR;  Service: Vascular;  Laterality: Left;  . Carotid endarterectomy Left   . Pv angiogram  02/18/2014    tibial vessel diseas bil.  . Angioplasty  02/28/14    diamond back orbital rotational atherectomy of Rt. tibial  . Lower ext duplex doppler  03/14/14    Rt ABI 1.2  . Lower extremity angiogram Bilateral 02/18/2014    Procedure: LOWER EXTREMITY ANGIOGRAM;  Surgeon: [name], MD;  Location: [facility] CATH LAB;  Service: Cardiovascular;  Laterality: Bilateral;  . Lower extremity angiogram N/A 10/31/2014    Procedure: LOWER EXTREMITY ANGIOGRAM;   Surgeon: [name], MD;  Location: [facility] CATH LAB;  Service: Cardiovascular;  Laterality: N/A;    There were no vitals filed for this visit.  Visit Diagnosis:  Nonhealing nonsurgical wound with necrosis of muscle         Wound Therapy - 05/06/15 0918    Subjective Pt states that he has restless leg syndrome bad today.   Patient and Family Stated Goals wounds to heal    Prior Treatments podiatrists self care.   Pain Assessment No/denies pain   Pressure Ulcer Properties Date First Assessed: 02/25/15 Location: Toe (Comment  which one) Location Orientation: Left Staging: Stage III -  Full thickness tissue loss. Subcutaneous fat may be visible but bone, tendon or muscle are NOT exposed.   Wound Properties Date First Assessed: 03/14/14 Time First Assessed: 1930 Wound Type: Diabetic ulcer Location: Other (Comment) , r big toe  Location Orientation: Right Wound Description (Comments): nonhealing wound on planar aspct of MTP of great toe  Present on Admission: Yes   Dressing Type Silver dressings  Silver, 2x2, foam, vaseline, medipore tape   Dressing Changed Changed   Dressing Status Old drainage   Dressing Change Frequency Daily   Site / Wound Assessment Red   % Wound base Red or Granulating  100%   % Wound base Yellow 0%   Peri-wound Assessment --  callous   Margins Epibole (rolled edges)   Drainage Amount Scant   Drainage Description Serous   Treatment Cleansed;Debridement (Selective)   Selective Debridement - Location callous area and surrounding dry skin and slough   Selective Debridement - Tools Used Forceps;Scalpel   Selective Debridement - Tissue Removed callous; dead skin   Wound Therapy - Clinical Statement Improving approximation superior border of wound.  Noted increased blood on dressings upon removal of dressings.  Continued selectived debridement for removal of callous wound perimeter.  Continued with silver dressings with vaseline apprlied surrounding wound.  No reports  of pain through session, pt able to sleep during debridement.   Wound Therapy - Functional Problem List difficulty walkng   Factors Delaying/Impairing Wound Healing Altered sensation;Diabetes Mellitus;Multiple medical problems;Vascular compromise   Hydrotherapy Plan Debridement;Dressing change   Wound Therapy - Frequency Other (comment)   Wound Therapy - Current Recommendations PT   Wound Plan Continue with current PT POC   Dressing  Silver, 2x2, foam, vaseline perimeter, 4x4, medipore tape   Decrease Necrotic Tissue to STG: 4 weeks: 0%   Decrease Necrotic Tissue - Progress Met   Increase Granulation Tissue to STG: 4 weeks 100%   Increase Granulation Tissue - Progress Met   Decrease Length/Width/Depth by (cm) STG: decrease Lt to have no tunnelling ; Rt to be decresed to 1.0x .75 x .1 STG for 4 weeks;  LTG healed 8 weeks   Decrease Length/Width/Depth - Progress Progressing toward goal   Additional Wound Therapy Goal Pt to state it is easier to walk 8 weeks    Additional Wound Therapy Goal - Progress Met          Problem List Patient Active Problem List   Diagnosis Date Noted  . Nonhealing skin ulcer 10/28/2014  . PAD (peripheral artery disease) 10/28/2014  . Bilateral carotid artery disease 07/08/2014  . Critical lower limb ischemia 02/28/2014  . Obstructive sleep apnea 02/12/2014  . Aftercare following surgery of the circulatory system, [place] 12/04/2013  . Carotid stenosis 11/29/2013  . Restless leg syndrome 10/23/2013  . Sleep apnea 10/23/2013  . Obesity (BMI 30.0-34.9) 10/23/2013  . Hyperlipidemia 07/16/2013  . Peripheral arterial disease 07/16/2013  . Occlusion and stenosis of carotid artery without mention of cerebral infarction 04/24/2012  . History of stroke June 2013 03/14/2012  . Hypertension 03/14/2012  . Type 2 diabetes mellitus with circulatory disorder 03/14/2012  . Peripheral neuropathy 03/14/2012  . Cardiomyopathy- EF NL 5/14 03/14/2012  . ARTHRITIS, RIGHT FOOT  06/26/2008   Rayetta Humphrey, PT CLT [phone] 05/06/2015, 9:23 AM  [place] [address] [place], [place], [address] Phone: [phone]   Fax:  [phone]

## 2015-05-08 ENCOUNTER — Ambulatory Visit (HOSPITAL_COMMUNITY): Payer: PPO | Admitting: Physical Therapy

## 2015-05-08 DIAGNOSIS — M6289 Other specified disorders of muscle: Secondary | ICD-10-CM | POA: Diagnosis not present

## 2015-05-08 DIAGNOSIS — T148XXA Other injury of unspecified body region, initial encounter: Secondary | ICD-10-CM

## 2015-05-08 NOTE — Therapy (Signed)
[place], [place], [address] Phone: [phone]   Fax:  [phone]  Wound Care Therapy  Patient Details  Name: [name] MRN: [id number] Date of Birth: April 09, 1946 Referring Provider:  [name], MD  Encounter Date: 05/08/2015      PT End of Session - 05/08/15 0905    Visit Number 19   Number of Visits 22   Date for PT Re-Evaluation 05/13/15   Authorization Type medicare   Authorization Time Period G-code done 37th visit    Authorization - Visit Number 44   Authorization - Number of Visits 47   PT Start Time 0805   PT Stop Time 0840   PT Time Calculation (min) 35 min   Activity Tolerance Patient tolerated treatment well   Behavior During Therapy [facility] for tasks assessed/performed      Past Medical History  Diagnosis Date  . Hypertension   . Diabetes mellitus   . Carotid artery occlusion   . Stroke March 08, 2012  . Hyperlipidemia   . Peripheral arterial disease     nonhealing ulcers bilaterally on each great toe  . PONV (postoperative nausea and vomiting)   . Arthritis   . Hypothyroidism   . Shortness of breath   . Obstructive sleep apnea     Past Surgical History  Procedure Laterality Date  . Back surgery    . Cervical fusion    . Foot surgery    . Spine surgery    . Eye surgery    . Endarterectomy Left 11/29/2013    Procedure: ENDARTERECTOMY CAROTID;  Surgeon: Serafina Mitchell, MD;  Location: [facility] OR;  Service: Vascular;  Laterality: Left;  . Carotid endarterectomy Left   . Pv angiogram  02/18/2014    tibial vessel diseas bil.  . Angioplasty  02/28/14    diamond back orbital rotational atherectomy of Rt. tibial  . Lower ext duplex doppler  03/14/14    Rt ABI 1.2  . Lower extremity angiogram Bilateral 02/18/2014    Procedure: LOWER EXTREMITY ANGIOGRAM;  Surgeon: [name], MD;  Location: [facility] CATH LAB;  Service: Cardiovascular;  Laterality: Bilateral;  . Lower extremity angiogram  N/A 10/31/2014    Procedure: LOWER EXTREMITY ANGIOGRAM;  Surgeon: [name], MD;  Location: [facility] CATH LAB;  Service: Cardiovascular;  Laterality: N/A;    There were no vitals filed for this visit.  Visit Diagnosis:  Nonhealing nonsurgical wound with necrosis of muscle                 Wound Therapy - 05/08/15 0901    Subjective Pt states he hopes to get his shoes soon.  Currently without pain.   Patient and Family Stated Goals wounds to heal    Prior Treatments podiatrists self care.   Pain Assessment No/denies pain   Pressure Ulcer Properties Date First Assessed: 02/25/15 Location: Toe (Comment  which one) Location Orientation: Left Staging: Stage III -  Full thickness tissue loss. Subcutaneous fat may be visible but bone, tendon or muscle are NOT exposed.   Wound Properties Date First Assessed: 03/14/14 Time First Assessed: 1930 Wound Type: Diabetic ulcer Location: Other (Comment) , r big toe  Location Orientation: Right Wound Description (Comments): nonhealing wound on planar aspct of MTP of great toe  Present on Admission: Yes   Dressing Type Silver dressings  xeroform, 2x2, medipore tape   Dressing Changed Changed   Dressing Status Old drainage   Dressing  Change Frequency Daily   Site / Wound Assessment Red   % Wound base Red or Granulating 100%   % Wound base Yellow 0%   Peri-wound Assessment --  callous   Margins Attached edges (approximated)   Drainage Amount Scant   Drainage Description Serous   Treatment Cleansed;Debridement (Selective)   Selective Debridement - Location callous area and surrounding dry skin and slough   Selective Debridement - Tools Used Forceps;Scalpel   Selective Debridement - Tissue Removed callous; dead skin   Wound Therapy - Clinical Statement Wound with improving approximation of surrounding tissue and continues to get smaller. Improving integrity of surrounding tissue as well.  Changed to xeroform since wound bed is dryer today.     Wound Therapy - Functional Problem List difficulty walkng   Factors Delaying/Impairing Wound Healing Altered sensation;Diabetes Mellitus;Multiple medical problems;Vascular compromise   Hydrotherapy Plan Debridement;Dressing change   Wound Therapy - Frequency Other (comment)   Wound Therapy - Current Recommendations PT   Wound Plan Continue with current PT POC   Dressing  xeroform, 2x2, medipore tape   Decrease Necrotic Tissue to STG: 4 weeks: 0%   Increase Granulation Tissue to STG: 4 weeks 100%   Decrease Length/Width/Depth by (cm) STG: decrease Lt to have no tunnelling ; Rt to be decresed to 1.0x .75 x .1 STG for 4 weeks;  LTG healed 8 weeks   Additional Wound Therapy Goal Pt to state it is easier to walk 8 weeks                               Problem List Patient Active Problem List   Diagnosis Date Noted  . Nonhealing skin ulcer 10/28/2014  . PAD (peripheral artery disease) 10/28/2014  . Bilateral carotid artery disease 07/08/2014  . Critical lower limb ischemia 02/28/2014  . Obstructive sleep apnea 02/12/2014  . Aftercare following surgery of the circulatory system, [place] 12/04/2013  . Carotid stenosis 11/29/2013  . Restless leg syndrome 10/23/2013  . Sleep apnea 10/23/2013  . Obesity (BMI 30.0-34.9) 10/23/2013  . Hyperlipidemia 07/16/2013  . Peripheral arterial disease 07/16/2013  . Occlusion and stenosis of carotid artery without mention of cerebral infarction 04/24/2012  . History of stroke June 2013 03/14/2012  . Hypertension 03/14/2012  . Type 2 diabetes mellitus with circulatory disorder 03/14/2012  . Peripheral neuropathy 03/14/2012  . Cardiomyopathy- EF NL 5/14 03/14/2012  . ARTHRITIS, RIGHT FOOT 06/26/2008    Teena Irani, PTA/CLT [phone]  05/08/2015, 9:11 AM  [place], [place], [address] Phone: [phone]   Fax:  [phone]

## 2015-05-13 ENCOUNTER — Ambulatory Visit (HOSPITAL_COMMUNITY): Payer: PPO | Admitting: Physical Therapy

## 2015-05-13 DIAGNOSIS — T148XXA Other injury of unspecified body region, initial encounter: Secondary | ICD-10-CM

## 2015-05-13 DIAGNOSIS — M6289 Other specified disorders of muscle: Secondary | ICD-10-CM | POA: Diagnosis not present

## 2015-05-13 NOTE — Therapy (Signed)
[place], [place], [address] Phone: [phone]   Fax:  [phone]  Wound Care Therapy  Patient Details  Name: [name] MRN: [id number] Date of Birth: 29-May-1946 Referring Provider:  [name], MD  Encounter Date: 05/13/2015      PT End of Session - 05/13/15 0851    Visit Number 18   Number of Visits 16   Authorization Type medicare   Authorization Time Period G-code done 37th visit    Authorization - Visit Number 24   Authorization - Number of Visits 73   PT Start Time 0803   PT Stop Time 0845   PT Time Calculation (min) 42 min      Past Medical History  Diagnosis Date  . Hypertension   . Diabetes mellitus   . Carotid artery occlusion   . Stroke March 08, 2012  . Hyperlipidemia   . Peripheral arterial disease     nonhealing ulcers bilaterally on each great toe  . PONV (postoperative nausea and vomiting)   . Arthritis   . Hypothyroidism   . Shortness of breath   . Obstructive sleep apnea     Past Surgical History  Procedure Laterality Date  . Back surgery    . Cervical fusion    . Foot surgery    . Spine surgery    . Eye surgery    . Endarterectomy Left 11/29/2013    Procedure: ENDARTERECTOMY CAROTID;  Surgeon: Serafina Mitchell, MD;  Location: [facility] OR;  Service: Vascular;  Laterality: Left;  . Carotid endarterectomy Left   . Pv angiogram  02/18/2014    tibial vessel diseas bil.  . Angioplasty  02/28/14    diamond back orbital rotational atherectomy of Rt. tibial  . Lower ext duplex doppler  03/14/14    Rt ABI 1.2  . Lower extremity angiogram Bilateral 02/18/2014    Procedure: LOWER EXTREMITY ANGIOGRAM;  Surgeon: [name], MD;  Location: [facility] CATH LAB;  Service: Cardiovascular;  Laterality: Bilateral;  . Lower extremity angiogram N/A 10/31/2014    Procedure: LOWER EXTREMITY ANGIOGRAM;  Surgeon: [name], MD;  Location: [facility] CATH LAB;  Service: Cardiovascular;  Laterality:  N/A;    There were no vitals filed for this visit.  Visit Diagnosis:  Nonhealing nonsurgical wound with necrosis of muscle      Subjective Assessment - 05/13/15 0845    Subjective Pt states he turned and pulled his Lt hamstring.  Comes in walking with a cane due to this but therapist noted and told pt. that his gait is much safer with the cane and he should continue to ambulate with his cane.    Currently in Pain? No/denies            Wound Therapy - 05/13/15 0847    Patient and Family Stated Goals wounds to heal    Prior Treatments podiatrists self care.   Pain Assessment No/denies pain   Pressure Ulcer Properties Date First Assessed: 02/25/15 Location: Toe (Comment  which one) Location Orientation: Left Staging: Stage III -  Full thickness tissue loss. Subcutaneous fat may be visible but bone, tendon or muscle are NOT exposed.   Wound Properties Date First Assessed: 03/14/14 Time First Assessed: 1930 Wound Type: Diabetic ulcer Location: Other (Comment) , r big toe  Location Orientation: Right Wound Description (Comments): nonhealing wound on planar aspct of MTP of great toe  Present on Admission: Yes   Dressing Type Other (Comment)  hydrogel to wound vaseline to perimeter, 2x2 and AB cut out    Dressing Changed Changed   Dressing Status Old drainage   Dressing Change Frequency Daily   Site / Wound Assessment Red   % Wound base Red or Granulating 100%   % Wound base Yellow 0%   Peri-wound Assessment --  callous   Wound Length (cm) 1 cm   Wound Width (cm) 1.2 cm   Wound Depth (cm) 0.2 cm   Undermining (cm) from 1-3 oclock with greatise at 1:00 = .5 cm    Margins Epibole (rolled edges)   Drainage Amount Scant   Drainage Description Serous   Treatment Cleansed;Debridement (Selective)   Selective Debridement - Location callous area and surrounding dry skin and slough   Selective Debridement - Tools Used Forceps;Scissors   Selective Debridement - Tissue Removed callous; dead  skin   Wound Therapy - Clinical Statement Wound with improving approximation of surrounding tissue and continues to get smaller. Improving integrity of surrounding tissue as well.  Changed to hydrogel and vaseline.  since wound bed is dryer today.    Wound Therapy - Functional Problem List difficulty walkng   Factors Delaying/Impairing Wound Healing Altered sensation;Diabetes Mellitus;Multiple medical problems;Vascular compromise   Hydrotherapy Plan Debridement;Dressing change   Wound Therapy - Frequency Other (comment)   Wound Therapy - Current Recommendations PT   Wound Plan Continue with current PT POC   Dressing  hydrogel to wound; vaseline to perimeter 2x2 and cut out abd pad for pressure relief.    Decrease Necrotic Tissue to STG: 4 weeks: 0%   Decrease Necrotic Tissue - Progress Met   Increase Granulation Tissue to STG: 4 weeks 100%   Increase Granulation Tissue - Progress Met   Decrease Length/Width/Depth by (cm) STG: decrease Lt to have no tunnelling ; Rt to be decresed to 1.0x .75 x .1 STG for 4 weeks;  LTG healed 8 weeks   Decrease Length/Width/Depth - Progress Progressing toward goal   Additional Wound Therapy Goal Pt to state it is easier to walk 8 weeks                Problem List Patient Active Problem List   Diagnosis Date Noted  . Nonhealing skin ulcer 10/28/2014  . PAD (peripheral artery disease) 10/28/2014  . Bilateral carotid artery disease 07/08/2014  . Critical lower limb ischemia 02/28/2014  . Obstructive sleep apnea 02/12/2014  . Aftercare following surgery of the circulatory system, [place] 12/04/2013  . Carotid stenosis 11/29/2013  . Restless leg syndrome 10/23/2013  . Sleep apnea 10/23/2013  . Obesity (BMI 30.0-34.9) 10/23/2013  . Hyperlipidemia 07/16/2013  . Peripheral arterial disease 07/16/2013  . Occlusion and stenosis of carotid artery without mention of cerebral infarction 04/24/2012  . History of stroke June 2013 03/14/2012  . Hypertension  03/14/2012  . Type 2 diabetes mellitus with circulatory disorder 03/14/2012  . Peripheral neuropathy 03/14/2012  . Cardiomyopathy- EF NL 5/14 03/14/2012  . ARTHRITIS, RIGHT FOOT 06/26/2008    Rayetta Humphrey, PT CLT [phone] 05/13/2015, 8:52 AM  [place], [place], [address] Phone: [phone]   Fax:  [phone]

## 2015-05-15 ENCOUNTER — Ambulatory Visit (HOSPITAL_COMMUNITY): Payer: PPO | Admitting: Physical Therapy

## 2015-05-15 DIAGNOSIS — M6289 Other specified disorders of muscle: Secondary | ICD-10-CM | POA: Diagnosis not present

## 2015-05-15 DIAGNOSIS — T148XXA Other injury of unspecified body region, initial encounter: Secondary | ICD-10-CM

## 2015-05-15 NOTE — Therapy (Signed)
[place], [place], [address] Phone: [phone]   Fax:  [phone]  Wound Care Therapy  Patient Details  Name: [name] MRN: [id number] Date of Birth: Aug 21, 1946 Referring Provider:  [name], MD  Encounter Date: 05/15/2015      PT End of Session - 05/15/15 0855    Visit Number 79   Number of Visits 27   Date for PT Re-Evaluation 05/13/15   Authorization Type medicare   Authorization Time Period G-code done 37th visit    Authorization - Visit Number 46   Authorization - Number of Visits 23   PT Start Time 0807   PT Stop Time 0846   PT Time Calculation (min) 39 min   Activity Tolerance Patient tolerated treatment well   Behavior During Therapy [facility] for tasks assessed/performed      Past Medical History  Diagnosis Date  . Hypertension   . Diabetes mellitus   . Carotid artery occlusion   . Stroke March 08, 2012  . Hyperlipidemia   . Peripheral arterial disease     nonhealing ulcers bilaterally on each great toe  . PONV (postoperative nausea and vomiting)   . Arthritis   . Hypothyroidism   . Shortness of breath   . Obstructive sleep apnea     Past Surgical History  Procedure Laterality Date  . Back surgery    . Cervical fusion    . Foot surgery    . Spine surgery    . Eye surgery    . Endarterectomy Left 11/29/2013    Procedure: ENDARTERECTOMY CAROTID;  Surgeon: Serafina Mitchell, MD;  Location: [facility] OR;  Service: Vascular;  Laterality: Left;  . Carotid endarterectomy Left   . Pv angiogram  02/18/2014    tibial vessel diseas bil.  . Angioplasty  02/28/14    diamond back orbital rotational atherectomy of Rt. tibial  . Lower ext duplex doppler  03/14/14    Rt ABI 1.2  . Lower extremity angiogram Bilateral 02/18/2014    Procedure: LOWER EXTREMITY ANGIOGRAM;  Surgeon: [name], MD;  Location: [facility] CATH LAB;  Service: Cardiovascular;  Laterality: Bilateral;  . Lower extremity angiogram  N/A 10/31/2014    Procedure: LOWER EXTREMITY ANGIOGRAM;  Surgeon: [name], MD;  Location: [facility] CATH LAB;  Service: Cardiovascular;  Laterality: N/A;    There were no vitals filed for this visit.  Visit Diagnosis:  Nonhealing nonsurgical wound with necrosis of muscle          Wound Therapy - 05/15/15 0851    Subjective Pt states that he is doing well.  Comes into department without cane.     Patient and Family Stated Goals wounds to heal    Prior Treatments podiatrists self care.   Pain Assessment No/denies pain   Pressure Ulcer Properties Date First Assessed: 02/25/15 Location: Toe (Comment  which one) Location Orientation: Left Staging: Stage III -  Full thickness tissue loss. Subcutaneous fat may be visible but bone, tendon or muscle are NOT exposed.   Wound Properties Date First Assessed: 03/14/14 Time First Assessed: 1930 Wound Type: Diabetic ulcer Location: Other (Comment) , r big toe  Location Orientation: Right Wound Description (Comments): nonhealing wound on planar aspct of MTP of great toe  Present on Admission: Yes   Dressing Type Other (Comment)  hydrogel to wound vaseline to perimeter, 2x2 and AB cut out    Dressing Status Old drainage   Dressing Change Frequency  Daily   Site / Wound Assessment Red   % Wound base Red or Granulating 100%   % Wound base Yellow 0%   Peri-wound Assessment --  callous   Wound Length (cm) 1 cm   Wound Width (cm) 1.2 cm   Wound Depth (cm) 0.2 cm   Margins Epibole (rolled edges)   Drainage Amount Scant   Drainage Description Serous   Treatment Cleansed;Debridement (Selective)   Selective Debridement - Location callous area and surrounding dry skin and slough   Selective Debridement - Tools Used Forceps;Scissors   Selective Debridement - Tissue Removed callous; dead skin   Wound Therapy - Clinical Statement Wound with improving approximation of surrounding tissue and continues to get smaller. Improving integrity of surrounding tissue  as well. Decrease pt to one time a week.   Wound Therapy - Functional Problem List difficulty walkng   Factors Delaying/Impairing Wound Healing Altered sensation;Diabetes Mellitus;Multiple medical problems;Vascular compromise   Hydrotherapy Plan Debridement;Dressing change   Wound Therapy - Frequency Other (comment)   Wound Therapy - Current Recommendations PT   Wound Plan Continue with current PT POC   Dressing  hydrogel to wound; vaseline to perimeter 2x2 and cut out abd pad for pressure relief.    Decrease Necrotic Tissue to STG: 4 weeks: 0%   Decrease Necrotic Tissue - Progress Met   Increase Granulation Tissue to STG: 4 weeks 100%   Increase Granulation Tissue - Progress Met   Decrease Length/Width/Depth by (cm) STG: decrease Lt to have no tunnelling ; Rt to be decresed to 1.0x .75 x .1 STG for 4 weeks;  LTG healed 8 weeks   Decrease Length/Width/Depth - Progress Met   Additional Wound Therapy Goal Pt to state it is easier to walk 8 weeks    Additional Wound Therapy Goal - Progress Met           Plan - 05/15/15 8469    PT Next Visit Plan Measure wound.  G-codes needed; decrease to one time a week.          Problem List Patient Active Problem List   Diagnosis Date Noted  . Nonhealing skin ulcer 10/28/2014  . PAD (peripheral artery disease) 10/28/2014  . Bilateral carotid artery disease 07/08/2014  . Critical lower limb ischemia 02/28/2014  . Obstructive sleep apnea 02/12/2014  . Aftercare following surgery of the circulatory system, [place] 12/04/2013  . Carotid stenosis 11/29/2013  . Restless leg syndrome 10/23/2013  . Sleep apnea 10/23/2013  . Obesity (BMI 30.0-34.9) 10/23/2013  . Hyperlipidemia 07/16/2013  . Peripheral arterial disease 07/16/2013  . Occlusion and stenosis of carotid artery without mention of cerebral infarction 04/24/2012  . History of stroke June 2013 03/14/2012  . Hypertension 03/14/2012  . Type 2 diabetes mellitus with circulatory disorder  03/14/2012  . Peripheral neuropathy 03/14/2012  . Cardiomyopathy- EF NL 5/14 03/14/2012  . ARTHRITIS, RIGHT FOOT 06/26/2008   Rayetta Humphrey, PT CLT [phone] 05/15/2015, 8:57 AM  [place], [place], [address] Phone: [phone]   Fax:  [phone]

## 2015-05-20 ENCOUNTER — Ambulatory Visit (HOSPITAL_COMMUNITY): Payer: PPO | Admitting: Physical Therapy

## 2015-05-20 DIAGNOSIS — M6289 Other specified disorders of muscle: Secondary | ICD-10-CM | POA: Diagnosis not present

## 2015-05-20 DIAGNOSIS — T148XXA Other injury of unspecified body region, initial encounter: Secondary | ICD-10-CM

## 2015-05-20 NOTE — Addendum Note (Signed)
Addended by: Leeroy Cha on: 05/20/2015 09:11 AM   Modules accepted: Orders

## 2015-05-20 NOTE — Therapy (Signed)
[place], [place], [address] Phone: [phone]   Fax:  [phone]  Wound Care Therapy  Patient Details  Name: [name] MRN: [id number] Date of Birth: 1946/01/14 Referring Provider:  [name], MD  Encounter Date: 05/20/2015      PT End of Session - 05/20/15 0859    Visit Number 79   Number of Visits 51   Date for PT Re-Evaluation 06/19/15   Authorization Type medicare   Authorization Time Period Gcode done on 47 th visit   Authorization - Visit Number 47   Authorization - Number of Visits 17   PT Start Time 0802   PT Stop Time 0842   PT Time Calculation (min) 40 min   Activity Tolerance Patient tolerated treatment well      Past Medical History  Diagnosis Date  . Hypertension   . Diabetes mellitus   . Carotid artery occlusion   . Stroke March 08, 2012  . Hyperlipidemia   . Peripheral arterial disease     nonhealing ulcers bilaterally on each great toe  . PONV (postoperative nausea and vomiting)   . Arthritis   . Hypothyroidism   . Shortness of breath   . Obstructive sleep apnea     Past Surgical History  Procedure Laterality Date  . Back surgery    . Cervical fusion    . Foot surgery    . Spine surgery    . Eye surgery    . Endarterectomy Left 11/29/2013    Procedure: ENDARTERECTOMY CAROTID;  Surgeon: Serafina Mitchell, MD;  Location: [facility] OR;  Service: Vascular;  Laterality: Left;  . Carotid endarterectomy Left   . Pv angiogram  02/18/2014    tibial vessel diseas bil.  . Angioplasty  02/28/14    diamond back orbital rotational atherectomy of Rt. tibial  . Lower ext duplex doppler  03/14/14    Rt ABI 1.2  . Lower extremity angiogram Bilateral 02/18/2014    Procedure: LOWER EXTREMITY ANGIOGRAM;  Surgeon: [name], MD;  Location: [facility] CATH LAB;  Service: Cardiovascular;  Laterality: Bilateral;  . Lower extremity angiogram N/A 10/31/2014    Procedure: LOWER EXTREMITY ANGIOGRAM;   Surgeon: [name], MD;  Location: [facility] CATH LAB;  Service: Cardiovascular;  Laterality: N/A;    There were no vitals filed for this visit.  Visit Diagnosis:  Nonhealing nonsurgical wound with necrosis of muscle                 Wound Therapy - 05/20/15 0849    Subjective Pt comes into department using his cane.  Pt states that he can tell that his gait is more stable with the cane.    Patient and Family Stated Goals wounds to heal    Prior Treatments podiatrists self care.   Pain Assessment 0-10   Pressure Ulcer Properties Date First Assessed: 02/25/15 Location: Toe (Comment  which one) Location Orientation: Left Staging: Stage III -  Full thickness tissue loss. Subcutaneous fat may be visible but bone, tendon or muscle are NOT exposed.   Wound Properties Date First Assessed: 03/14/14 Time First Assessed: 1930 Wound Type: Diabetic ulcer Location: Other (Comment) , r big toe  Location Orientation: Right Wound Description (Comments): nonhealing wound on planar aspct of MTP of great toe  Present on Admission: Yes   Dressing Type Other (Comment)  hydrogel to wound vaseline to perimeter, 2x2 and AB cut out    Dressing Status  Old drainage   Dressing Change Frequency Daily   Site / Wound Assessment Red   % Wound base Red or Granulating 100%  wound is red but is not granulated.    % Wound base Yellow 0%   Peri-wound Assessment --  callous   Margins Epibole (rolled edges)  used forcep to lift up margins to allow wound to approximate   Drainage Amount Scant   Drainage Description Serous   Treatment Cleansed;Debridement (Selective)   Selective Debridement - Location callous area and surrounding dry skin and slough   Selective Debridement - Tools Used Forceps;Scissors   Selective Debridement - Tissue Removed callous; dead skin   Wound Therapy - Clinical Statement Pt had noted epiboled edges.  Therapist was able to run forcep around inferior edge and release tissue however  suprerior edege remained adherent.  Pt continues to be pain free and calloused area continues to decrease.  Pt is to recieve his new diabetitc shoes either this or next week. Pt to continue to be seen one time a week for the next two weeks if no significant change may decrease to once every other week.    Wound Therapy - Functional Problem List difficulty walkng   Factors Delaying/Impairing Wound Healing Altered sensation;Diabetes Mellitus;Multiple medical problems;Vascular compromise   Hydrotherapy Plan Debridement;Dressing change once a week for two weeks then decrease to once every other week.   Wound Therapy - Frequency Other (comment)   Wound Therapy - Current Recommendations PT   Wound Plan Continue with current PT POC   Dressing  medihoney to woundbed, vaseline to callous, ab pad cut out for pressure relief of wound followed by kerlix    Decrease Necrotic Tissue to STG: 4 weeks: 0%   Decrease Necrotic Tissue - Progress Met   Increase Granulation Tissue to STG: 4 weeks 100%   Increase Granulation Tissue - Progress Met   Decrease Length/Width/Depth by (cm) STG: decrease Lt to have no tunnelling ; Rt to be decresed to 1.0x .75 x .1 STG for 4 weeks;  LTG healed 8 weeks   Decrease Length/Width/Depth - Progress Progressing toward goal   Additional Wound Therapy Goal Pt to state it is easier to walk 8 weeks    Additional Wound Therapy Goal - Progress Met                 PT Education - 06/17/15 0859    Education provided Yes   Education Details to take longer slower steps with cane   Person(s) Educated Patient   Methods Explanation   Comprehension Verbalized understanding;Returned demonstration                      G-Codes - 06/17/2015 0900    Functional Assessment Tool Used Based on skilled clinical assessment, wound dimensions, slow wound healing progress    Functional Limitation Other PT primary   Other PT Primary Current Status (F7902) At least 1 percent but  less than 20 percent impaired, limited or restricted   Other PT Primary Goal Status (I0973) At least 1 percent but less than 20 percent impaired, limited or restricted       Problem List Patient Active Problem List   Diagnosis Date Noted  . Nonhealing skin ulcer 10/28/2014  . PAD (peripheral artery disease) 10/28/2014  . Bilateral carotid artery disease 07/08/2014  . Critical lower limb ischemia 02/28/2014  . Obstructive sleep apnea 02/12/2014  . Aftercare following surgery of the circulatory system, [place] 12/04/2013  . Carotid  stenosis 11/29/2013  . Restless leg syndrome 10/23/2013  . Sleep apnea 10/23/2013  . Obesity (BMI 30.0-34.9) 10/23/2013  . Hyperlipidemia 07/16/2013  . Peripheral arterial disease 07/16/2013  . Occlusion and stenosis of carotid artery without mention of cerebral infarction 04/24/2012  . History of stroke June 2013 03/14/2012  . Hypertension 03/14/2012  . Type 2 diabetes mellitus with circulatory disorder 03/14/2012  . Peripheral neuropathy 03/14/2012  . Cardiomyopathy- EF NL 5/14 03/14/2012  . ARTHRITIS, RIGHT FOOT 06/26/2008   Rayetta Humphrey, PT CLT [phone] 05/20/2015, 9:05 AM  [place], [place], [address] Phone: [phone]   Fax:  [phone]

## 2015-05-22 ENCOUNTER — Ambulatory Visit (HOSPITAL_COMMUNITY): Payer: PPO | Attending: Podiatry

## 2015-05-22 DIAGNOSIS — M6289 Other specified disorders of muscle: Secondary | ICD-10-CM | POA: Diagnosis not present

## 2015-05-22 DIAGNOSIS — T148 Other injury of unspecified body region: Secondary | ICD-10-CM | POA: Diagnosis present

## 2015-05-22 DIAGNOSIS — T148XXA Other injury of unspecified body region, initial encounter: Secondary | ICD-10-CM

## 2015-05-22 NOTE — Therapy (Addendum)
[place], [place], [address] Phone: [phone]   Fax:  [phone]  Wound Care Therapy  Patient Details  Name: [name] MRN: [id number] Date of Birth: 03-12-1946 Referring Provider:  [name], MD  Encounter Date: 05/22/2015      PT End of Session - 05/22/15 1302    Visit Number 71   Number of Visits 53   Date for PT Re-Evaluation 06/19/15   Authorization Type medicare   Authorization Time Period Gcode done on 47 th visit   Authorization - Visit Number 17   Authorization - Number of Visits 62   PT Start Time 0805   PT Stop Time 0845   PT Time Calculation (min) 40 min   Activity Tolerance Patient tolerated treatment well   Behavior During Therapy [facility] for tasks assessed/performed      Past Medical History  Diagnosis Date  . Hypertension   . Diabetes mellitus   . Carotid artery occlusion   . Stroke March 08, 2012  . Hyperlipidemia   . Peripheral arterial disease     nonhealing ulcers bilaterally on each great toe  . PONV (postoperative nausea and vomiting)   . Arthritis   . Hypothyroidism   . Shortness of breath   . Obstructive sleep apnea     Past Surgical History  Procedure Laterality Date  . Back surgery    . Cervical fusion    . Foot surgery    . Spine surgery    . Eye surgery    . Endarterectomy Left 11/29/2013    Procedure: ENDARTERECTOMY CAROTID;  Surgeon: Serafina Mitchell, MD;  Location: [facility] OR;  Service: Vascular;  Laterality: Left;  . Carotid endarterectomy Left   . Pv angiogram  02/18/2014    tibial vessel diseas bil.  . Angioplasty  02/28/14    diamond back orbital rotational atherectomy of Rt. tibial  . Lower ext duplex doppler  03/14/14    Rt ABI 1.2  . Lower extremity angiogram Bilateral 02/18/2014    Procedure: LOWER EXTREMITY ANGIOGRAM;  Surgeon: [name], MD;  Location: [facility] CATH LAB;  Service: Cardiovascular;  Laterality: Bilateral;  . Lower extremity angiogram  N/A 10/31/2014    Procedure: LOWER EXTREMITY ANGIOGRAM;  Surgeon: [name], MD;  Location: [facility] CATH LAB;  Service: Cardiovascular;  Laterality: N/A;    There were no vitals filed for this visit.  Visit Diagnosis:  Nonhealing nonsurgical wound with necrosis of muscle      Subjective Assessment - 05/22/15 0902    Subjective Dressings intact, no reports of pain through session   Currently in Pain? No/denies            Wound Therapy - 05/22/15 0904    Subjective Dressings intact, no reports of pain through session   Patient and Family Stated Goals wounds to heal    Prior Treatments podiatrists self care.   Pain Assessment No/denies pain   Pressure Ulcer Properties Date First Assessed: 02/25/15 Location: Toe (Comment  which one) Location Orientation: Left Staging: Stage III -  Full thickness tissue loss. Subcutaneous fat may be visible but bone, tendon or muscle are NOT exposed.   Wound Properties Date First Assessed: 03/14/14 Time First Assessed: 1930 Wound Type: Diabetic ulcer Location: Other (Comment) , r big toe  Location Orientation: Right Wound Description (Comments): nonhealing wound on planar aspct of MTP of great toe  Present on Admission: Yes   Dressing Type --  medihoney, vaseline perimeter, 2x2, ABD cut, tape and gauze   Dressing Status Old drainage   Dressing Change Frequency Daily   Site / Wound Assessment Red   % Wound base Red or Granulating 100%  Wound bed red, not granulated   % Wound base Yellow 0%   Peri-wound Assessment --  callous   Wound Length (cm) 1 cm   Wound Width (cm) 1.2 cm   Wound Depth (cm) 0.2 cm   Undermining (cm) decreasing undermining  1-3 o'clock   Margins Epibole (rolled edges)   Drainage Amount Scant   Drainage Description Serous   Treatment Cleansed;Debridement (Selective)   Selective Debridement - Location callous area and surrounding dry skin and slough   Selective Debridement - Tools Used Forceps;Scalpel   Selective  Debridement - Tissue Removed callous; dead skin   Wound Therapy - Clinical Statement Minimal debridement required over wound bed, main focus on debridement of callous and superior epiboled edges. No reports of pain or discomfort through session. Pt stated he has not received diabetic shoes yet.  Following discussion with evaluated therapist decision made to reduce frequency to 1x week due to wound improvements yet slow healing.     Wound Therapy - Functional Problem List difficulty walkng   Factors Delaying/Impairing Wound Healing Altered sensation;Diabetes Mellitus;Multiple medical problems;Vascular compromise   Hydrotherapy Plan Debridement;Dressing change   Wound Therapy - Frequency Other (comment)  Reduce 1x week   Wound Therapy - Current Recommendations PT   Wound Plan Continue with current PT POC, reduce frequency to 1x week   Dressing  medihoney to woundbed, vaseline to callous, ab pad cut out for pressure relief of wound followed by kerlix         Problem List Patient Active Problem List   Diagnosis Date Noted  . Nonhealing skin ulcer 10/28/2014  . PAD (peripheral artery disease) 10/28/2014  . Bilateral carotid artery disease 07/08/2014  . Critical lower limb ischemia 02/28/2014  . Obstructive sleep apnea 02/12/2014  . Aftercare following surgery of the circulatory system, [place] 12/04/2013  . Carotid stenosis 11/29/2013  . Restless leg syndrome 10/23/2013  . Sleep apnea 10/23/2013  . Obesity (BMI 30.0-34.9) 10/23/2013  . Hyperlipidemia 07/16/2013  . Peripheral arterial disease 07/16/2013  . Occlusion and stenosis of carotid artery without mention of cerebral infarction 04/24/2012  . History of stroke June 2013 03/14/2012  . Hypertension 03/14/2012  . Type 2 diabetes mellitus with circulatory disorder 03/14/2012  . Peripheral neuropathy 03/14/2012  . Cardiomyopathy- EF NL 5/14 03/14/2012  . ARTHRITIS, RIGHT FOOT 06/26/2008   [name], [place]; [place]    Rayetta Humphrey, PT CLT [place] [address] [place], [place], [address] Phone: [phone]   Fax:  [phone]

## 2015-05-23 ENCOUNTER — Other Ambulatory Visit: Payer: Self-pay | Admitting: Cardiovascular Disease

## 2015-05-23 DIAGNOSIS — I739 Peripheral vascular disease, unspecified: Secondary | ICD-10-CM

## 2015-05-27 ENCOUNTER — Ambulatory Visit (HOSPITAL_COMMUNITY)
Admission: RE | Admit: 2015-05-27 | Discharge: 2015-05-27 | Disposition: A | Discharge status: Home / Self Care | Payer: PPO | Source: Ambulatory Visit | Attending: Cardiovascular Disease | Admitting: Cardiovascular Disease

## 2015-05-27 DIAGNOSIS — E785 Hyperlipidemia, unspecified: Secondary | ICD-10-CM | POA: Diagnosis not present

## 2015-05-27 DIAGNOSIS — I10 Essential (primary) hypertension: Secondary | ICD-10-CM | POA: Insufficient documentation

## 2015-05-27 DIAGNOSIS — I739 Peripheral vascular disease, unspecified: Secondary | ICD-10-CM

## 2015-05-27 DIAGNOSIS — E119 Type 2 diabetes mellitus without complications: Secondary | ICD-10-CM | POA: Diagnosis not present

## 2015-05-27 DIAGNOSIS — F17200 Nicotine dependence, unspecified, uncomplicated: Secondary | ICD-10-CM | POA: Insufficient documentation

## 2015-05-28 ENCOUNTER — Ambulatory Visit (HOSPITAL_COMMUNITY): Payer: PPO | Admitting: Physical Therapy

## 2015-05-30 ENCOUNTER — Ambulatory Visit (HOSPITAL_COMMUNITY): Payer: PPO

## 2015-06-02 ENCOUNTER — Telehealth: Payer: Self-pay

## 2015-06-02 DIAGNOSIS — I739 Peripheral vascular disease, unspecified: Secondary | ICD-10-CM

## 2015-06-02 NOTE — Telephone Encounter (Signed)
-----   Message from [name], MD sent at 05/31/2015  2:31 PM EDT ----- No change from prior study. Repeat in 6 months

## 2015-06-03 ENCOUNTER — Ambulatory Visit (HOSPITAL_COMMUNITY): Payer: PPO | Admitting: Physical Therapy

## 2015-06-03 DIAGNOSIS — M6289 Other specified disorders of muscle: Secondary | ICD-10-CM | POA: Diagnosis not present

## 2015-06-03 DIAGNOSIS — T148XXA Other injury of unspecified body region, initial encounter: Secondary | ICD-10-CM

## 2015-06-03 NOTE — Therapy (Signed)
[place], [place], [address] Phone: [phone]   Fax:  [phone]  Wound Care Therapy  Patient Details  Name: [name] MRN: [id number] Date of Birth: Jan 14, 1946 Referring Provider:  [name], MD  Encounter Date: 06/03/2015      PT End of Session - 06/03/15 1120    Visit Number 40   Number of Visits 51   Date for PT Re-Evaluation 06/19/15   Authorization Type medicare   Authorization Time Period Gcode done on 47 th visit   Authorization - Visit Number 49   Authorization - Number of Visits 30   PT Start Time 0803   PT Stop Time 0850   PT Time Calculation (min) 47 min   Activity Tolerance Patient tolerated treatment well   Behavior During Therapy [facility] for tasks assessed/performed      Past Medical History  Diagnosis Date  . Hypertension   . Diabetes mellitus   . Carotid artery occlusion   . Stroke March 08, 2012  . Hyperlipidemia   . Peripheral arterial disease     nonhealing ulcers bilaterally on each great toe  . PONV (postoperative nausea and vomiting)   . Arthritis   . Hypothyroidism   . Shortness of breath   . Obstructive sleep apnea     Past Surgical History  Procedure Laterality Date  . Back surgery    . Cervical fusion    . Foot surgery    . Spine surgery    . Eye surgery    . Endarterectomy Left 11/29/2013    Procedure: ENDARTERECTOMY CAROTID;  Surgeon: Serafina Mitchell, MD;  Location: [facility] OR;  Service: Vascular;  Laterality: Left;  . Carotid endarterectomy Left   . Pv angiogram  02/18/2014    tibial vessel diseas bil.  . Angioplasty  02/28/14    diamond back orbital rotational atherectomy of Rt. tibial  . Lower ext duplex doppler  03/14/14    Rt ABI 1.2  . Lower extremity angiogram Bilateral 02/18/2014    Procedure: LOWER EXTREMITY ANGIOGRAM;  Surgeon: [name], MD;  Location: [facility] CATH LAB;  Service: Cardiovascular;  Laterality: Bilateral;  . Lower extremity angiogram  N/A 10/31/2014    Procedure: LOWER EXTREMITY ANGIOGRAM;  Surgeon: [name], MD;  Location: [facility] CATH LAB;  Service: Cardiovascular;  Laterality: N/A;    There were no vitals filed for this visit.  Visit Diagnosis:  Nonhealing nonsurgical wound with necrosis of muscle       Wound Therapy - 06/03/15 1112    Subjective Pt states that his wound on his Rt foot is beginning to hurt again.  Therapist questioned when he was going to obtain his new shoes with the pressure relief; pt hopes in the next two weeks.    Patient and Family Stated Goals wounds to heal    Prior Treatments podiatrists self care.   Pain Assessment 0-10   Pain Score 3    Pressure Ulcer Properties Date First Assessed: 02/25/15 Location: Toe (Comment  which one) Location Orientation: Left Staging: Stage III -  Full thickness tissue loss. Subcutaneous fat may be visible but bone, tendon or muscle are NOT exposed.   Wound Properties Date First Assessed: 03/14/14 Time First Assessed: 1930 Wound Type: Diabetic ulcer Location: Other (Comment) , r big toe  Location Orientation: Right Wound Description (Comments): nonhealing wound on planar aspct of MTP of great toe  Present on Admission: Yes   Dressing Type --  silver , vaseline perimeter, 2x2, ABD cut, kerlix   Dressing Status Old drainage   Dressing Change Frequency Daily   Site / Wound Assessment Red  not granulated   % Wound base Red or Granulating 100%  Wound bed red, not granulated   % Wound base Yellow 0%   Peri-wound Assessment --  callous   Wound Length (cm) 1.1 cm   Wound Width (cm) 1 cm   Wound Depth (cm) 0.5 cm   Undermining (cm) --  from 11-3:30 on clock; with .5 cm at 2:00    Margins Epibole (rolled edges)   Drainage Amount Scant   Drainage Description Serosanguineous   Treatment Cleansed;Debridement (Selective)   Selective Debridement - Location callous area and surrounding dry skin and slough   Selective Debridement - Tools Used Forceps;Scalpel    Selective Debridement - Tissue Removed callous; dead skin   Wound Therapy - Clinical Statement Minimal debridement required over wound bed, main focus on debridement of callous and superior epiboled edges. Pt has not been monitoring his blood sugars; discussed with pt the importance of doing this on a daily basis.    Wound Therapy - Functional Problem List difficulty walkng   Factors Delaying/Impairing Wound Healing Altered sensation;Diabetes Mellitus;Multiple medical problems;Vascular compromise   Hydrotherapy Plan Debridement;Dressing change   Wound Therapy - Frequency Other (comment)  Reduce 1x week   Wound Therapy - Current Recommendations PT   Wound Plan Continue with current PT POC, reduce frequency to 1x week   Dressing  changed to silver as there is increased undermining and increase depth of wound today.    Decrease Necrotic Tissue to STG: 4 weeks: 0%   Decrease Necrotic Tissue - Progress Met   Increase Granulation Tissue to STG: 4 weeks 100%   Increase Granulation Tissue - Progress Met   Decrease Length/Width/Depth by (cm) STG: decrease Lt to have no tunnelling ; Rt to be decresed to 1.0x .75 x .1 STG for 4 weeks;  LTG healed 8 weeks   Decrease Length/Width/Depth - Progress Progressing toward goal   Additional Wound Therapy Goal Pt to state it is easier to walk 8 weeks    Additional Wound Therapy Goal - Progress Met             Problem List Patient Active Problem List   Diagnosis Date Noted  . Nonhealing skin ulcer 10/28/2014  . PAD (peripheral artery disease) 10/28/2014  . Bilateral carotid artery disease 07/08/2014  . Critical lower limb ischemia 02/28/2014  . Obstructive sleep apnea 02/12/2014  . Aftercare following surgery of the circulatory system, [place] 12/04/2013  . Carotid stenosis 11/29/2013  . Restless leg syndrome 10/23/2013  . Sleep apnea 10/23/2013  . Obesity (BMI 30.0-34.9) 10/23/2013  . Hyperlipidemia 07/16/2013  . Peripheral arterial disease  07/16/2013  . Occlusion and stenosis of carotid artery without mention of cerebral infarction 04/24/2012  . History of stroke June 2013 03/14/2012  . Hypertension 03/14/2012  . Type 2 diabetes mellitus with circulatory disorder 03/14/2012  . Peripheral neuropathy 03/14/2012  . Cardiomyopathy- EF NL 5/14 03/14/2012  . ARTHRITIS, RIGHT FOOT 06/26/2008   Rayetta Humphrey, PT CLT [phone] 06/03/2015, 11:21 AM  [place], [place], [address] Phone: [phone]   Fax:  [phone]

## 2015-06-05 ENCOUNTER — Ambulatory Visit (HOSPITAL_COMMUNITY): Payer: PPO | Admitting: Physical Therapy

## 2015-06-10 ENCOUNTER — Ambulatory Visit (HOSPITAL_COMMUNITY): Payer: PPO | Admitting: Physical Therapy

## 2015-06-10 DIAGNOSIS — M6289 Other specified disorders of muscle: Secondary | ICD-10-CM | POA: Diagnosis not present

## 2015-06-10 DIAGNOSIS — T148XXA Other injury of unspecified body region, initial encounter: Secondary | ICD-10-CM

## 2015-06-10 NOTE — Therapy (Signed)
[place], [place], [address] Phone: [phone]   Fax:  [phone]  Wound Care Therapy  Patient Details  Name: [name] MRN: [id number] Date of Birth: 1946/07/09 Referring Provider:  [name], MD  Encounter Date: 06/10/2015      PT End of Session - 06/10/15 1106    Visit Number 50   Number of Visits 59   Date for PT Re-Evaluation 06/19/15   Authorization Type medicare   Authorization Time Period Gcode done on 47 th visit   Authorization - Visit Number 49   Authorization - Number of Visits 69   PT Start Time 1026   PT Stop Time 1050   PT Time Calculation (min) 24 min   Activity Tolerance Patient tolerated treatment well   Behavior During Therapy [facility] for tasks assessed/performed      Past Medical History  Diagnosis Date  . Hypertension   . Diabetes mellitus   . Carotid artery occlusion   . Stroke March 08, 2012  . Hyperlipidemia   . Peripheral arterial disease     nonhealing ulcers bilaterally on each great toe  . PONV (postoperative nausea and vomiting)   . Arthritis   . Hypothyroidism   . Shortness of breath   . Obstructive sleep apnea     Past Surgical History  Procedure Laterality Date  . Back surgery    . Cervical fusion    . Foot surgery    . Spine surgery    . Eye surgery    . Endarterectomy Left 11/29/2013    Procedure: ENDARTERECTOMY CAROTID;  Surgeon: Serafina Mitchell, MD;  Location: [facility] OR;  Service: Vascular;  Laterality: Left;  . Carotid endarterectomy Left   . Pv angiogram  02/18/2014    tibial vessel diseas bil.  . Angioplasty  02/28/14    diamond back orbital rotational atherectomy of Rt. tibial  . Lower ext duplex doppler  03/14/14    Rt ABI 1.2  . Lower extremity angiogram Bilateral 02/18/2014    Procedure: LOWER EXTREMITY ANGIOGRAM;  Surgeon: [name], MD;  Location: [facility] CATH LAB;  Service: Cardiovascular;  Laterality: Bilateral;  . Lower extremity angiogram  N/A 10/31/2014    Procedure: LOWER EXTREMITY ANGIOGRAM;  Surgeon: [name], MD;  Location: [facility] CATH LAB;  Service: Cardiovascular;  Laterality: N/A;    There were no vitals filed for this visit.  Visit Diagnosis:  Nonhealing nonsurgical wound with necrosis of muscle                 Wound Therapy - 06/10/15 1100    Subjective Pt states he should have his orthopedic shoe soon . Missed his appt this morning but was able to be worked into schedule.    Patient and Family Stated Goals wounds to heal    Prior Treatments podiatrists self care.   Pain Assessment No/denies pain   Pressure Ulcer Properties Date First Assessed: 02/25/15 Location: Toe (Comment  which one) Location Orientation: Left Staging: Stage III -  Full thickness tissue loss. Subcutaneous fat may be visible but bone, tendon or muscle are NOT exposed.   Wound Properties Date First Assessed: 03/14/14 Time First Assessed: 1930 Wound Type: Diabetic ulcer Location: Other (Comment) , r big toe  Location Orientation: Right Wound Description (Comments): nonhealing wound on planar aspct of MTP of great toe  Present on Admission: Yes   Dressing Type Silver dressings  silver , vaseline perimeter, 2x2, ABD  cut, kerlix   Dressing Status Old drainage   Dressing Change Frequency Daily   Site / Wound Assessment Red  not granulated   % Wound base Red or Granulating 100%  callous preventing approximation of superior borders   % Wound base Yellow 0%   Peri-wound Assessment --  callous   Margins Unattached edges (unapproximated)  superiorly   Drainage Amount Minimal   Drainage Description Serosanguineous   Treatment Cleansed;Debridement (Selective)   Selective Debridement - Location callous area and surrounding dry skin and slough   Selective Debridement - Tools Used Forceps;Scalpel   Selective Debridement - Tissue Removed callous; dead skin   Wound Therapy - Clinical Statement Callous remains biggest issue and hope this  will improve after receiving specialized shoes with pressure relief.  Debrided large amount of callous superior wound border.  Inferior wound border is approximating.  Continued with acticoat, ABD and medipore tape.    Wound Therapy - Functional Problem List difficulty walkng   Factors Delaying/Impairing Wound Healing Altered sensation;Diabetes Mellitus;Multiple medical problems;Vascular compromise   Hydrotherapy Plan Debridement;Dressing change   Wound Therapy - Frequency Other (comment)  Reduce 1x week   Wound Therapy - Current Recommendations PT   Wound Plan Continue with current PT POC, reduce frequency to 1x week   Dressing  Acticoat, 2X2, ABD, medipore   Decrease Necrotic Tissue to STG: 4 weeks: 0%   Increase Granulation Tissue to STG: 4 weeks 100%   Decrease Length/Width/Depth by (cm) STG: decrease Lt to have no tunnelling ; Rt to be decresed to 1.0x .75 x .1 STG for 4 weeks;  LTG healed 8 weeks   Additional Wound Therapy Goal Pt to state it is easier to walk 8 weeks                               Problem List Patient Active Problem List   Diagnosis Date Noted  . Nonhealing skin ulcer 10/28/2014  . PAD (peripheral artery disease) 10/28/2014  . Bilateral carotid artery disease 07/08/2014  . Critical lower limb ischemia 02/28/2014  . Obstructive sleep apnea 02/12/2014  . Aftercare following surgery of the circulatory system, [place] 12/04/2013  . Carotid stenosis 11/29/2013  . Restless leg syndrome 10/23/2013  . Sleep apnea 10/23/2013  . Obesity (BMI 30.0-34.9) 10/23/2013  . Hyperlipidemia 07/16/2013  . Peripheral arterial disease 07/16/2013  . Occlusion and stenosis of carotid artery without mention of cerebral infarction 04/24/2012  . History of stroke June 2013 03/14/2012  . Hypertension 03/14/2012  . Type 2 diabetes mellitus with circulatory disorder 03/14/2012  . Peripheral neuropathy 03/14/2012  . Cardiomyopathy- EF NL 5/14 03/14/2012  . ARTHRITIS,  RIGHT FOOT 06/26/2008     Teena Irani, PTA/CLT [phone]  06/10/2015, 11:07 AM  [place] [address] [place], [place], [address] Phone: [phone]   Fax:  [phone]

## 2015-06-11 ENCOUNTER — Ambulatory Visit (INDEPENDENT_AMBULATORY_CARE_PROVIDER_SITE_OTHER): Payer: PPO | Admitting: Podiatry

## 2015-06-11 DIAGNOSIS — M2012 Hallux valgus (acquired), left foot: Secondary | ICD-10-CM | POA: Diagnosis not present

## 2015-06-11 DIAGNOSIS — E08621 Diabetes mellitus due to underlying condition with foot ulcer: Secondary | ICD-10-CM

## 2015-06-11 DIAGNOSIS — E1149 Type 2 diabetes mellitus with other diabetic neurological complication: Secondary | ICD-10-CM

## 2015-06-11 DIAGNOSIS — E1140 Type 2 diabetes mellitus with diabetic neuropathy, unspecified: Secondary | ICD-10-CM

## 2015-06-11 DIAGNOSIS — L84 Corns and callosities: Secondary | ICD-10-CM | POA: Diagnosis not present

## 2015-06-11 DIAGNOSIS — M2041 Other hammer toe(s) (acquired), right foot: Secondary | ICD-10-CM

## 2015-06-11 DIAGNOSIS — M2042 Other hammer toe(s) (acquired), left foot: Secondary | ICD-10-CM

## 2015-06-11 DIAGNOSIS — L97529 Non-pressure chronic ulcer of other part of left foot with unspecified severity: Secondary | ICD-10-CM

## 2015-06-11 DIAGNOSIS — E1151 Type 2 diabetes mellitus with diabetic peripheral angiopathy without gangrene: Secondary | ICD-10-CM

## 2015-06-11 DIAGNOSIS — L97519 Non-pressure chronic ulcer of other part of right foot with unspecified severity: Secondary | ICD-10-CM

## 2015-06-11 NOTE — Progress Notes (Signed)
Patient ID: [name], male   DOB: 24-Dec-1945, 69 y.o.   MRN: [id number] Patient presents for diabetic shoe pick up, shoes are tried on for good fit.  Patient received 1 Pair Hushpuppies [id number] Gil Velcro black men's 14 extra wide and 3 pairs custom molded diabetic inserts with Deep pocket accommodations for ulcers on Left 1st toe and right 1st MPJ.  Verbal and written break in and wear instructions given.  Patient will follow up for scheduled routine care.    Custom shoes 2 and custom molded multilaminated insoles 6 fit satisfactorily  We'll see patient at next scheduled visit   Kendell Bane DPM

## 2015-06-11 NOTE — Patient Instructions (Signed)

## 2015-06-12 ENCOUNTER — Ambulatory Visit (HOSPITAL_COMMUNITY): Payer: PPO | Admitting: Physical Therapy

## 2015-06-16 NOTE — Progress Notes (Signed)
Patient ID: [name], male   DOB: [birth year], 69 y.o.   MRN: [id number] Patient presents for shoe measurement and insert scan.

## 2015-06-17 ENCOUNTER — Ambulatory Visit (HOSPITAL_COMMUNITY): Payer: PPO | Admitting: Physical Therapy

## 2015-06-17 DIAGNOSIS — T148XXA Other injury of unspecified body region, initial encounter: Secondary | ICD-10-CM

## 2015-06-17 DIAGNOSIS — M6289 Other specified disorders of muscle: Secondary | ICD-10-CM | POA: Diagnosis not present

## 2015-06-17 NOTE — Therapy (Signed)
[place], [place], [address] Phone: [phone]   Fax:  [phone]  Physical Therapy Treatment  Patient Details  Name: [name] MRN: [id number] Date of Birth: 1946/08/21 Referring Provider:  [name], MD  Encounter Date: 06/17/2015      PT End of Session - 06/17/15 0902    Visit Number 48   Number of Visits 53   Authorization Type medicare   Authorization Time Period Gcode done on 47 th visit   Authorization - Visit Number 52   Authorization - Number of Visits 22   PT Start Time 0805   PT Stop Time 0850   PT Time Calculation (min) 45 min   Activity Tolerance Patient tolerated treatment well      Past Medical History  Diagnosis Date  . Hypertension   . Diabetes mellitus   . Carotid artery occlusion   . Stroke March 08, 2012  . Hyperlipidemia   . Peripheral arterial disease     nonhealing ulcers bilaterally on each great toe  . PONV (postoperative nausea and vomiting)   . Arthritis   . Hypothyroidism   . Shortness of breath   . Obstructive sleep apnea     Past Surgical History  Procedure Laterality Date  . Back surgery    . Cervical fusion    . Foot surgery    . Spine surgery    . Eye surgery    . Endarterectomy Left 11/29/2013    Procedure: ENDARTERECTOMY CAROTID;  Surgeon: Serafina Mitchell, MD;  Location: [facility] OR;  Service: Vascular;  Laterality: Left;  . Carotid endarterectomy Left   . Pv angiogram  02/18/2014    tibial vessel diseas bil.  . Angioplasty  02/28/14    diamond back orbital rotational atherectomy of Rt. tibial  . Lower ext duplex doppler  03/14/14    Rt ABI 1.2  . Lower extremity angiogram Bilateral 02/18/2014    Procedure: LOWER EXTREMITY ANGIOGRAM;  Surgeon: [name], MD;  Location: [facility] CATH LAB;  Service: Cardiovascular;  Laterality: Bilateral;  . Lower extremity angiogram N/A 10/31/2014    Procedure: LOWER EXTREMITY ANGIOGRAM;  Surgeon: [name], MD;   Location: [facility] CATH LAB;  Service: Cardiovascular;  Laterality: N/A;    There were no vitals filed for this visit.  Visit Diagnosis:  Nonhealing nonsurgical wound with necrosis of muscle            Wound Therapy - 06/17/15 0854    Subjective Pt states that he got his new diabetic shoes and that they feel good.  He states he is being bothered by restless leg syndrome today.   Patient and Family Stated Goals wounds to heal    Prior Treatments podiatrists self care.   Pain Assessment No/denies pain   Pressure Ulcer Properties Date First Assessed: 02/25/15 Location: Toe (Comment  which one) Location Orientation: Left Staging: Stage III -  Full thickness tissue loss. Subcutaneous fat may be visible but bone, tendon or muscle are NOT exposed.   Wound Properties Date First Assessed: 03/14/14 Time First Assessed: 1930 Wound Type: Diabetic ulcer Location: Other (Comment) , r big toe  Location Orientation: Right Wound Description (Comments): nonhealing wound on planar aspct of MTP of great toe  Present on Admission: Yes   Dressing Type Moist to moist  Carraklenz/honey packed , vaseline perimeter, 2x2, ABD cut,   Dressing Status Old drainage   Dressing Change Frequency Daily   Site /  Wound Assessment Red  not granulated   % Wound base Red or Granulating 100%  callous preventing approximation of superior borders   % Wound base Yellow 0%   Peri-wound Assessment --  callous   Margins Unattached edges (unapproximated)  superiorly   Drainage Amount Minimal   Drainage Description Serosanguineous   Treatment Cleansed;Debridement (Selective)   Selective Debridement - Location callous area and surrounding dry skin and slough   Selective Debridement - Tools Used Forceps;Scissors  scapel not used due to leg "jumping"   Selective Debridement - Tissue Removed callous; dead skin   Wound Therapy - Clinical Statement Callous remains biggest issue and hope this will improve after receiving specialized  shoes with pressure relief.  Debrided large amount of callous superior wound border.  Inferior wound border is approximating.  Continued with acticoat, ABD and medipore tape.    Wound Therapy - Functional Problem List difficulty walkng   Factors Delaying/Impairing Wound Healing Altered sensation;Diabetes Mellitus;Multiple medical problems;Vascular compromise   Hydrotherapy Plan Debridement;Dressing change   Wound Therapy - Frequency Other (comment)  Reduce 1x week   Wound Therapy - Current Recommendations PT   Wound Plan Continue with current PT POC, continue frequency to 1x week   Dressing  moist 2x2 with carraklenz and honey packed into woundbed, vaseline to perimeter. followed by kling.    Decrease Necrotic Tissue to STG: 4 weeks: 0%   Increase Granulation Tissue to STG: 4 weeks 100%   Decrease Length/Width/Depth by (cm) STG: decrease Lt to have no tunnelling ; Rt to be decresed to 1.0x .75 x .1 STG for 4 weeks;  LTG healed 8 weeks   Additional Wound Therapy Goal Pt to state it is easier to walk 8 weeks                  Problem List Patient Active Problem List   Diagnosis Date Noted  . Nonhealing skin ulcer 10/28/2014  . PAD (peripheral artery disease) 10/28/2014  . Bilateral carotid artery disease 07/08/2014  . Critical lower limb ischemia 02/28/2014  . Obstructive sleep apnea 02/12/2014  . Aftercare following surgery of the circulatory system, [place] 12/04/2013  . Carotid stenosis 11/29/2013  . Restless leg syndrome 10/23/2013  . Sleep apnea 10/23/2013  . Obesity (BMI 30.0-34.9) 10/23/2013  . Hyperlipidemia 07/16/2013  . Peripheral arterial disease 07/16/2013  . Occlusion and stenosis of carotid artery without mention of cerebral infarction 04/24/2012  . History of stroke June 2013 03/14/2012  . Hypertension 03/14/2012  . Type 2 diabetes mellitus with circulatory disorder 03/14/2012  . Peripheral neuropathy 03/14/2012  . Cardiomyopathy- EF NL 5/14 03/14/2012  .  ARTHRITIS, RIGHT FOOT 06/26/2008    Rayetta Humphrey, PT CLT [phone] 06/17/2015, 9:09 AM  [place] [address] [place], [place], [address] Phone: [phone]   Fax:  [phone]

## 2015-06-18 ENCOUNTER — Encounter: Payer: Self-pay | Admitting: Podiatry

## 2015-06-18 ENCOUNTER — Ambulatory Visit (INDEPENDENT_AMBULATORY_CARE_PROVIDER_SITE_OTHER): Payer: PPO | Admitting: Podiatry

## 2015-06-18 VITALS — BP 100/48 | HR 76 | Temp 97.6°F | Resp 14

## 2015-06-18 DIAGNOSIS — B351 Tinea unguium: Secondary | ICD-10-CM | POA: Diagnosis not present

## 2015-06-18 DIAGNOSIS — M79676 Pain in unspecified toe(s): Secondary | ICD-10-CM

## 2015-06-18 DIAGNOSIS — E1151 Type 2 diabetes mellitus with diabetic peripheral angiopathy without gangrene: Secondary | ICD-10-CM

## 2015-06-18 DIAGNOSIS — L84 Corns and callosities: Secondary | ICD-10-CM

## 2015-06-18 NOTE — Patient Instructions (Signed)
Continue wound care for foot ulcer and wound care center in follow the instructions  Diabetes and Foot Care Diabetes may cause you to have problems because of poor blood supply (circulation) to your feet and legs. This may cause the skin on your feet to become thinner, break easier, and heal more slowly. Your skin may become dry, and the skin may peel and crack. You may also have nerve damage in your legs and feet causing decreased feeling in them. You may not notice minor injuries to your feet that could lead to infections or more serious problems. Taking care of your feet is one of the most important things you can do for yourself.  HOME CARE INSTRUCTIONS  Wear shoes at all times, even in the house. Do not go barefoot. Bare feet are easily injured.  Check your feet daily for blisters, cuts, and redness. If you cannot see the bottom of your feet, use a mirror or ask someone for help.  Wash your feet with warm water (do not use hot water) and mild soap. Then pat your feet and the areas between your toes until they are completely dry. Do not soak your feet as this can dry your skin.  Apply a moisturizing lotion or petroleum jelly (that does not contain alcohol and is unscented) to the skin on your feet and to dry, brittle toenails. Do not apply lotion between your toes.  Trim your toenails straight across. Do not dig under them or around the cuticle. File the edges of your nails with an emery board or nail file.  Do not cut corns or calluses or try to remove them with medicine.  Wear clean socks or stockings every day. Make sure they are not too tight. Do not wear knee-high stockings since they may decrease blood flow to your legs.  Wear shoes that fit properly and have enough cushioning. To break in new shoes, wear them for just a few hours a day. This prevents you from injuring your feet. Always look in your shoes before you put them on to be sure there are no objects inside.  Do not cross  your legs. This may decrease the blood flow to your feet.  If you find a minor scrape, cut, or break in the skin on your feet, keep it and the skin around it clean and dry. These areas may be cleansed with mild soap and water. Do not cleanse the area with peroxide, alcohol, or iodine.  When you remove an adhesive bandage, be sure not to damage the skin around it.  If you have a wound, look at it several times a day to make sure it is healing.  Do not use heating pads or hot water bottles. They may burn your skin. If you have lost feeling in your feet or legs, you may not know it is happening until it is too late.  Make sure your health care provider performs a complete foot exam at least annually or more often if you have foot problems. Report any cuts, sores, or bruises to your health care provider immediately. SEEK MEDICAL CARE IF:   You have an injury that is not healing.  You have cuts or breaks in the skin.  You have an ingrown nail.  You notice redness on your legs or feet.  You feel burning or tingling in your legs or feet.  You have pain or cramps in your legs and feet.  Your legs or feet are numb.  Your feet always feel cold. SEEK IMMEDIATE MEDICAL CARE IF:   There is increasing redness, swelling, or pain in or around a wound.  There is a red line that goes up your leg.  Pus is coming from a wound.  You develop a fever or as directed by your health care provider.  You notice a bad smell coming from an ulcer or wound. Document Released: 09/03/2000 Document Revised: 05/09/2013 Document Reviewed: 02/13/2013 [facility] Patient Information 2015 [place], [place]. This information is not intended to replace advice given to you by your health care provider. Make sure you discuss any questions you have with your health care provider.

## 2015-06-18 NOTE — Progress Notes (Signed)
   Subjective:    Patient ID: [name], male    DOB: 02/24/1946, 69 y.o.   MRN: [id number]  HPI   \This patient presents today complaining of painful toenails and requests nail debridement. His also complaining of a painful corn lateral border fourth right toe. Patient is under management of wound care for diabetic skin ulcer in the plantar aspect of the right foot. He is seen at the wound center at weekly intervals and applying topical antibiotic ointment to the skin ulcer right foot and wearing diabetic shoes with custom insoles. He is wearing a toe wedge between the third and fourth right toe  Review of Systems  All other systems reviewed and are negative.      Objective:   Physical Exam  Orientated 3 The toenails are elongated, brittle, hypertrophic, incurvated, discolored and tender direct palpation 6-10 Bleeding corn lateral third right toe without any surrounding erythema, edema or drainage Plantar skin ulcer 15 mm in diameter with granular base surrounded macerated hyperkeratotic tissue. There is no active drainage. There is no surrounding erythema, edema or warmth in the plantar ulcer right foot       Assessment & Plan:   Assessment: Symptomatic onychomycoses 6-10 Pre-ulcerative corn lateral third right toe Diabetic with peripheral arterial disease Plantar skin ulcer right first MPJ without clinical sign of infection under treatment at wound care center  Plan: Debridement toenails 10 and mechanically allegedly without a bleeding Debrided pre-ulcerative corn lateral third right toe and dispensed silicone wedge to insert between third and fourth right toe Debride plantar skin ulcer right and dressed with Silvadene dressing  Follow wound care instructions provided to patient to wound care center Continue wearing diabetic shoes with custom insoles  Reappoint 10 weeks for skin a nail debridement

## 2015-06-19 ENCOUNTER — Ambulatory Visit (HOSPITAL_COMMUNITY): Payer: PPO | Admitting: Physical Therapy

## 2015-06-24 ENCOUNTER — Ambulatory Visit (HOSPITAL_COMMUNITY): Payer: PPO | Attending: Podiatry | Admitting: Physical Therapy

## 2015-06-24 DIAGNOSIS — M6289 Other specified disorders of muscle: Secondary | ICD-10-CM

## 2015-06-24 DIAGNOSIS — T148 Other injury of unspecified body region: Secondary | ICD-10-CM | POA: Insufficient documentation

## 2015-06-24 DIAGNOSIS — T148XXA Other injury of unspecified body region, initial encounter: Secondary | ICD-10-CM

## 2015-06-24 NOTE — Therapy (Signed)
[place], [place], [address] Phone: [phone]   Fax:  [phone]  Wound Care Therapy  Patient Details  Name: [name] MRN: [id number] Date of Birth: [birth year] Referring Provider:  [name], MD  Encounter Date: 06/24/2015      PT End of Session - 06/24/15 1216    Visit Number 51   Number of Visits 49   Authorization Type medicare   Authorization Time Period Gcode done on 47 th visit   Authorization - Visit Number 52   Authorization - Number of Visits 55   PT Start Time 0800   PT Stop Time 0840   PT Time Calculation (min) 40 min   Activity Tolerance Patient tolerated treatment well      Past Medical History  Diagnosis Date  . Hypertension   . Diabetes mellitus   . Carotid artery occlusion   . Stroke March 08, 2012  . Hyperlipidemia   . Peripheral arterial disease     nonhealing ulcers bilaterally on each great toe  . PONV (postoperative nausea and vomiting)   . Arthritis   . Hypothyroidism   . Shortness of breath   . Obstructive sleep apnea     Past Surgical History  Procedure Laterality Date  . Back surgery    . Cervical fusion    . Foot surgery    . Spine surgery    . Eye surgery    . Endarterectomy Left 11/29/2013    Procedure: ENDARTERECTOMY CAROTID;  Surgeon: Serafina Mitchell, MD;  Location: [facility] OR;  Service: Vascular;  Laterality: Left;  . Carotid endarterectomy Left   . Pv angiogram  02/18/2014    tibial vessel diseas bil.  . Angioplasty  02/28/14    diamond back orbital rotational atherectomy of Rt. tibial  . Lower ext duplex doppler  03/14/14    Rt ABI 1.2  . Lower extremity angiogram Bilateral 02/18/2014    Procedure: LOWER EXTREMITY ANGIOGRAM;  Surgeon: [name], MD;  Location: [facility] CATH LAB;  Service: Cardiovascular;  Laterality: Bilateral;  . Lower extremity angiogram N/A 10/31/2014    Procedure: LOWER EXTREMITY ANGIOGRAM;  Surgeon: [name], MD;   Location: [facility] CATH LAB;  Service: Cardiovascular;  Laterality: N/A;    There were no vitals filed for this visit.  Visit Diagnosis:  Nonhealing nonsurgical wound with necrosis of muscle                 Wound Therapy - 06/24/15 1211    Subjective Pt now wearing his new diabetic shoes that he reports are comfortable.  no pain today.   Patient and Family Stated Goals wounds to heal    Prior Treatments podiatrists self care.   Pain Assessment No/denies pain   Pressure Ulcer Properties Date First Assessed: 02/25/15 Location: Toe (Comment  which one) Location Orientation: Left Staging: Stage III -  Full thickness tissue loss. Subcutaneous fat may be visible but bone, tendon or muscle are NOT exposed.   Wound Properties Date First Assessed: 03/14/14 Time First Assessed: 1930 Wound Type: Diabetic ulcer Location: Other (Comment) , r big toe  Location Orientation: Right Wound Description (Comments): nonhealing wound on planar aspct of MTP of great toe  Present on Admission: Yes   Dressing Type Impregnated gauze (bismuth)   Dressing Status Old drainage   Dressing Change Frequency Daily   Site / Wound Assessment Red  not granulated   % Wound base Red or Granulating  100%  callous preventing approximation of superior borders   % Wound base Yellow 0%   Peri-wound Assessment --  callous   Wound Length (cm) 1.1 cm   Wound Width (cm) 1 cm   Wound Depth (cm) 0.5 cm   Margins Unattached edges (unapproximated)  superiorly   Drainage Amount Minimal   Drainage Description Serosanguineous   Treatment Cleansed;Debridement (Selective)   Selective Debridement - Location callous area and surrounding dry skin and slough   Selective Debridement - Tools Used Forceps;Scissors   Selective Debridement - Tissue Removed callous; dead skin   Wound Therapy - Clinical Statement Able to remove more callous today; most difficulty remains superior border where continues to fail to attach. Wound remains 100%  granulated but no change in size size in nearly a month.  Hoping the new shoes will decrease pressure in this area to assist with closure.     Wound Therapy - Functional Problem List difficulty walkng   Factors Delaying/Impairing Wound Healing Altered sensation;Diabetes Mellitus;Multiple medical problems;Vascular compromise   Hydrotherapy Plan Debridement;Dressing change   Wound Therapy - Frequency Other (comment)  Reduce 1x week   Wound Therapy - Current Recommendations PT   Wound Plan Continue with current PT POC, continue frequency to 1x week   Dressing  xeroform to soflten callous, 2X2, medipore   Decrease Necrotic Tissue to STG: 4 weeks: 0%   Increase Granulation Tissue to STG: 4 weeks 100%   Decrease Length/Width/Depth by (cm) STG: decrease Lt to have no tunnelling ; Rt to be decresed to 1.0x .75 x .1 STG for 4 weeks;  LTG healed 8 weeks   Additional Wound Therapy Goal Pt to state it is easier to walk 8 weeks                               Problem List Patient Active Problem List   Diagnosis Date Noted  . Nonhealing skin ulcer ([place]) 10/28/2014  . PAD (peripheral artery disease) ([place]) 10/28/2014  . Bilateral carotid artery disease ([place]) 07/08/2014  . Critical lower limb ischemia 02/28/2014  . Obstructive sleep apnea 02/12/2014  . Aftercare following surgery of the circulatory system, [place] 12/04/2013  . Carotid stenosis 11/29/2013  . Restless leg syndrome 10/23/2013  . Sleep apnea 10/23/2013  . Obesity (BMI 30.0-34.9) 10/23/2013  . Hyperlipidemia 07/16/2013  . Peripheral arterial disease ([place]) 07/16/2013  . Occlusion and stenosis of carotid artery without mention of cerebral infarction 04/24/2012  . History of stroke June 2013 03/14/2012  . Hypertension 03/14/2012  . Type 2 diabetes mellitus with circulatory disorder ([place]) 03/14/2012  . Peripheral neuropathy ([place]) 03/14/2012  . Cardiomyopathy- EF NL 5/14 03/14/2012  . ARTHRITIS, RIGHT FOOT 06/26/2008     Teena Irani, PTA/CLT [phone]  06/24/2015, 12:20 PM  [place], [place], [address] Phone: [phone]   Fax:  [phone]

## 2015-07-01 ENCOUNTER — Ambulatory Visit (HOSPITAL_COMMUNITY): Payer: PPO | Admitting: Physical Therapy

## 2015-07-01 DIAGNOSIS — M6289 Other specified disorders of muscle: Secondary | ICD-10-CM

## 2015-07-01 DIAGNOSIS — T148XXA Other injury of unspecified body region, initial encounter: Secondary | ICD-10-CM

## 2015-07-01 NOTE — Therapy (Signed)
[place], [place], [address] Phone: [phone]   Fax:  [phone]  Wound Care Therapy (reassessment)  Patient Details  Name: [name] MRN: [id number] Date of Birth: 03/03/1946 Referring Provider:  [name], MD  Encounter Date: 07/01/2015      PT End of Session - 07/01/15 1048    Visit Number 49   Number of Visits 14   Authorization Type medicare   Authorization Time Period Gcode done on 47 th visit   Authorization - Visit Number 53   Authorization - Number of Visits 24   PT Start Time 0802   PT Stop Time 0844   PT Time Calculation (min) 42 min   Activity Tolerance Patient tolerated treatment well      Past Medical History  Diagnosis Date  . Hypertension   . Diabetes mellitus   . Carotid artery occlusion   . Stroke March 08, 2012  . Hyperlipidemia   . Peripheral arterial disease     nonhealing ulcers bilaterally on each great toe  . PONV (postoperative nausea and vomiting)   . Arthritis   . Hypothyroidism   . Shortness of breath   . Obstructive sleep apnea     Past Surgical History  Procedure Laterality Date  . Back surgery    . Cervical fusion    . Foot surgery    . Spine surgery    . Eye surgery    . Endarterectomy Left 11/29/2013    Procedure: ENDARTERECTOMY CAROTID;  Surgeon: Serafina Mitchell, MD;  Location: [facility] OR;  Service: Vascular;  Laterality: Left;  . Carotid endarterectomy Left   . Pv angiogram  02/18/2014    tibial vessel diseas bil.  . Angioplasty  02/28/14    diamond back orbital rotational atherectomy of Rt. tibial  . Lower ext duplex doppler  03/14/14    Rt ABI 1.2  . Lower extremity angiogram Bilateral 02/18/2014    Procedure: LOWER EXTREMITY ANGIOGRAM;  Surgeon: [name], MD;  Location: [facility] CATH LAB;  Service: Cardiovascular;  Laterality: Bilateral;  . Lower extremity angiogram N/A 10/31/2014    Procedure: LOWER EXTREMITY ANGIOGRAM;  Surgeon: [name], MD;  Location: [facility] CATH LAB;  Service: Cardiovascular;  Laterality: N/A;    There were no vitals filed for this visit.  Visit Diagnosis:  Nonhealing nonsurgical wound with necrosis of muscle                 Wound Therapy - 07/01/15 1027    Subjective Pt states that he has been having increased pain    Patient and Family Stated Goals wounds to heal    Prior Treatments podiatrists self care.   Pain Assessment 0-10   Pain Score 5    Pain Type Acute pain   Pain Location Foot   Pain Orientation Right   Pain Onset With Activity   Pressure Ulcer Properties Date First Assessed: 02/25/15 Location: Toe (Comment  which one) Location Orientation: Left Staging: Stage III -  Full thickness tissue loss. Subcutaneous fat may be visible but bone, tendon or muscle are NOT exposed.   Wound Properties Date First Assessed: 03/14/14 Time First Assessed: 1930 Wound Type: Diabetic ulcer Location: Other (Comment) , r big toe  Location Orientation: Right Wound Description (Comments): nonhealing wound on planar aspct of MTP of great toe  Present on Admission: Yes   Dressing Type Silver dressings  was xeroform therapist changed to silver secondary to  rednes   Dressing Changed Changed   Dressing Status Old drainage   Dressing Change Frequency Daily   Site / Wound Assessment Red  not granulation tissue   % Wound base Red or Granulating 90%   % Wound base Yellow 10%   Peri-wound Assessment Edema;Erythema (blanchable)  callous 1 cm halo except increased superiorly now to 2 cm    Wound Length (cm) 1 cm   Wound Width (cm) 0.8 cm   Wound Depth (cm) 0.5 cm   Undermining (cm) from 9 to3 o'clock max is at 12: 00 .6 cm with serous drainage being able to be pushed from this area.   increased redness noted between great and second toe plantar   Margins Epibole (rolled edges)   Drainage Amount Minimal   Drainage Description Serous   Treatment Cleansed;Debridement (Selective)   Selective Debridement -  Location wound bed as well as periphery   Selective Debridement - Tools Used Forceps;Scissors   Selective Debridement - Tissue Removed slough from wound bed; callous around wound.   Wound Therapy - Clinical Statement Pt with increased pain today.  Therapist noted incresed callous formation superiorly going between great and second toe.  This area has slight increased edema and redness.  Noted serous drainage and increased pain when therapist pressed on this area.  Therapist stressed for pt to call to MD and get antibiotic.  Overall size of wound is slowly decreasing but undermining is increasing .    Wound Therapy - Functional Problem List More difficult to ambulate secondary to increased pain    Factors Delaying/Impairing Wound Healing Altered sensation;Diabetes Mellitus;Infection - systemic/local;Immobility;Multiple medical problems;Polypharmacy   Hydrotherapy Plan Debridement;Dressing change;Patient/family education   Wound Therapy - Frequency --  continue to monitor wound 1 x a week for the next 6 weeks.    Wound Therapy - Current Recommendations PT   Wound Plan Ensure pt has recieved an antibiotic, continue with silver  and pressure relieving of wound site.    Dressing  silver, 2x2 and kling.    Decrease Necrotic Tissue to STG: 4 weeks: 0%   Decrease Necrotic Tissue - Progress Partly met  was 100% until today.  Small area of slough able to be debri   Increase Granulation Tissue to STG: 4 weeks 100%   Increase Granulation Tissue - Progress Partly met   Decrease Length/Width/Depth by (cm) STG: decrease Lt to have no tunnelling ; Rt to be decresed to 1.0x .75 x .1 STG for 4 weeks;  LTG healed 8 weeks   Decrease Length/Width/Depth - Progress Progressing toward goal   Additional Wound Therapy Goal Pt to state it is easier to walk 8 weeks    Additional Wound Therapy Goal - Progress Progressing toward goal                 PT Education - 07/01/15 1048    Education provided Yes    Education Details encouraged pt to call MD to acquire an antibiotic.    Person(s) Educated Patient   Methods Explanation   Comprehension Verbalized understanding                     Problem List Patient Active Problem List   Diagnosis Date Noted  . Nonhealing skin ulcer ([place]) 10/28/2014  . PAD (peripheral artery disease) ([place]) 10/28/2014  . Bilateral carotid artery disease ([place]) 07/08/2014  . Critical lower limb ischemia 02/28/2014  . Obstructive sleep apnea 02/12/2014  . Aftercare following surgery of the circulatory  system, [place] 12/04/2013  . Carotid stenosis 11/29/2013  . Restless leg syndrome 10/23/2013  . Sleep apnea 10/23/2013  . Obesity (BMI 30.0-34.9) 10/23/2013  . Hyperlipidemia 07/16/2013  . Peripheral arterial disease ([place]) 07/16/2013  . Occlusion and stenosis of carotid artery without mention of cerebral infarction 04/24/2012  . History of stroke June 2013 03/14/2012  . Hypertension 03/14/2012  . Type 2 diabetes mellitus with circulatory disorder ([place]) 03/14/2012  . Peripheral neuropathy ([place]) 03/14/2012  . Cardiomyopathy- EF NL 5/14 03/14/2012  . ARTHRITIS, RIGHT FOOT 06/26/2008    Rayetta Humphrey, PT CLT [phone] 07/01/2015, 10:50 AM  [place], [place], [address] Phone: [phone]   Fax:  [phone]   Physical Therapy Progress Note  Dates of Reporting Period: 10/28/2014  to 07/01/2015  Objective Reports of Subjective Statement: Pt's pain recently has increased   Objective Measurements: see above   Goal Update: see above   Plan: PT wound on Lt Great toe has been healed for several months but wound on Rt plantar aspect is slow to heal.  Noted increased undermining, pain and redness today; pt was encouraged to contact MD re antibiotics.   Reason Skilled Services are Required: PT is a diabetic and has multiple other comorbidities.  His wound is on the plantar aspect of  his foot and due to OA it is difficult for him to properly care for this area.  Recommend continuing one time a week to debride callous and assess tunneling of wound.

## 2015-07-08 ENCOUNTER — Ambulatory Visit (HOSPITAL_COMMUNITY): Payer: PPO | Admitting: Physical Therapy

## 2015-07-08 DIAGNOSIS — T148XXA Other injury of unspecified body region, initial encounter: Secondary | ICD-10-CM

## 2015-07-08 DIAGNOSIS — M6289 Other specified disorders of muscle: Secondary | ICD-10-CM

## 2015-07-08 NOTE — Therapy (Signed)
[name], [place], [address] Phone: [phone]   Fax:  [phone]  Wound Care Therapy  Patient Details  Name: [name] MRN: [id number] Date of Birth: [birth year] No Data Recorded  Encounter Date: 07/08/2015      PT End of Session - 07/08/15 0908    Visit Number 30   Number of Visits 17   Date for PT Re-Evaluation 06/19/15   Authorization Type medicare   Authorization Time Period Gcode done on 47 th visit   Authorization - Visit Number 54   Authorization - Number of Visits 17   PT Start Time 0802   PT Stop Time 0836   PT Time Calculation (min) 34 min   Equipment Utilized During Treatment Gait belt   Activity Tolerance Patient tolerated treatment well   Behavior During Therapy [facility] for tasks assessed/performed      Past Medical History  Diagnosis Date  . Hypertension   . Diabetes mellitus   . Carotid artery occlusion   . Stroke March 08, 2012  . Hyperlipidemia   . Peripheral arterial disease     nonhealing ulcers bilaterally on each great toe  . PONV (postoperative nausea and vomiting)   . Arthritis   . Hypothyroidism   . Shortness of breath   . Obstructive sleep apnea     Past Surgical History  Procedure Laterality Date  . Back surgery    . Cervical fusion    . Foot surgery    . Spine surgery    . Eye surgery    . Endarterectomy Left 11/29/2013    Procedure: ENDARTERECTOMY CAROTID;  Surgeon: Serafina Mitchell, MD;  Location: [facility] OR;  Service: Vascular;  Laterality: Left;  . Carotid endarterectomy Left   . Pv angiogram  02/18/2014    tibial vessel diseas bil.  . Angioplasty  02/28/14    diamond back orbital rotational atherectomy of Rt. tibial  . Lower ext duplex doppler  03/14/14    Rt ABI 1.2  . Lower extremity angiogram Bilateral 02/18/2014    Procedure: LOWER EXTREMITY ANGIOGRAM;  Surgeon: [name], MD;  Location: [facility] CATH LAB;  Service: Cardiovascular;  Laterality: Bilateral;  .  Lower extremity angiogram N/A 10/31/2014    Procedure: LOWER EXTREMITY ANGIOGRAM;  Surgeon: [name], MD;  Location: [facility] CATH LAB;  Service: Cardiovascular;  Laterality: N/A;    There were no vitals filed for this visit.  Visit Diagnosis:  Nonhealing nonsurgical wound with necrosis of muscle                 Wound Therapy - 07/08/15 0853    Subjective Patient reports he is doing well today, no pain and went to MD for antibiotics as soon as he left therapy last session.    Patient and Family Stated Goals wounds to heal    Prior Treatments podiatrists self care.   Pain Assessment No/denies pain   Pressure Ulcer Properties Date First Assessed: 02/25/15 Location: Toe (Comment  which one) Location Orientation: Left Staging: Stage III -  Full thickness tissue loss. Subcutaneous fat may be visible but bone, tendon or muscle are NOT exposed.   Wound Properties Date First Assessed: 03/14/14 Time First Assessed: 1930 Wound Type: Diabetic ulcer Location: Other (Comment) , r big toe  Location Orientation: Right Wound Description (Comments): nonhealing wound on planar aspct of MTP of great toe  Present on Admission: Yes   Dressing Type Silver dressings  Dressing Changed Changed   Dressing Status Old drainage   Dressing Change Frequency Daily   Site / Wound Assessment Red   % Wound base Red or Granulating 100%   % Wound base Yellow 0%   Peri-wound Assessment Other (Comment)  callous    Undermining (cm) continue to note some undermining from approx 9 to 3, however    Margins Epibole (rolled edges)   Drainage Amount Minimal   Drainage Description Serous   Treatment Cleansed;Debridement (Selective);Packing (Impregnated strip);Tape changed;Other (Comment)  shaved callous, medihoney to wound, vasoline perimeter,   Selective Debridement - Location callous    Selective Debridement - Tools Used Forceps;Scalpel   Selective Debridement - Tissue Removed callous around wound    Wound  Therapy - Clinical Statement Patient no longer having pain and reports he went to MD for antibiotics immediately after PT session last week. Wound appears to have improved in genearl from last week, no pain or tenderness, no slough noted but did see undermining present still. Switched dressing to [place] as wound had improved, however still as a precaution due to recent inflammation/need for antibiotcis.     Wound Therapy - Functional Problem List More difficult to ambulate secondary to increased pain    Factors Delaying/Impairing Wound Healing Altered sensation;Diabetes Mellitus;Infection - systemic/local;Immobility;Multiple medical problems;Polypharmacy   Hydrotherapy Plan Debridement;Dressing change;Patient/family education   Wound Therapy - Frequency --  once a week for 5-6 more weeks    Wound Therapy - Current Recommendations PT   Wound Plan continue with current PT POC                  PT Education - 07/08/15 0908    Education provided No                     Problem List Patient Active Problem List   Diagnosis Date Noted  . Nonhealing skin ulcer ([place]) 10/28/2014  . PAD (peripheral artery disease) ([place]) 10/28/2014  . Bilateral carotid artery disease ([place]) 07/08/2014  . Critical lower limb ischemia 02/28/2014  . Obstructive sleep apnea 02/12/2014  . Aftercare following surgery of the circulatory system, [place] 12/04/2013  . Carotid stenosis 11/29/2013  . Restless leg syndrome 10/23/2013  . Sleep apnea 10/23/2013  . Obesity (BMI 30.0-34.9) 10/23/2013  . Hyperlipidemia 07/16/2013  . Peripheral arterial disease ([place]) 07/16/2013  . Occlusion and stenosis of carotid artery without mention of cerebral infarction 04/24/2012  . History of stroke June 2013 03/14/2012  . Hypertension 03/14/2012  . Type 2 diabetes mellitus with circulatory disorder ([place]) 03/14/2012  . Peripheral neuropathy ([place]) 03/14/2012  . Cardiomyopathy- EF NL 5/14 03/14/2012  . ARTHRITIS,  RIGHT FOOT 06/26/2008    Deniece Ree PT, DPT [place] [address] [place], [place], [address] Phone: [phone]   Fax:  [phone]  Name: [name] MRN: [id number] Date of Birth: [birth year]

## 2015-07-09 ENCOUNTER — Encounter: Payer: Self-pay | Admitting: Family

## 2015-07-14 ENCOUNTER — Ambulatory Visit: Payer: Medicare Other | Admitting: Family

## 2015-07-14 ENCOUNTER — Ambulatory Visit (HOSPITAL_COMMUNITY)
Admission: RE | Admit: 2015-07-14 | Discharge: 2015-07-14 | Disposition: A | Discharge status: Home / Self Care | Payer: PPO | Source: Ambulatory Visit | Attending: Family | Admitting: Family

## 2015-07-14 ENCOUNTER — Ambulatory Visit: Payer: PPO | Admitting: Family

## 2015-07-14 ENCOUNTER — Encounter (HOSPITAL_COMMUNITY): Payer: PPO

## 2015-07-14 ENCOUNTER — Encounter: Payer: Self-pay | Admitting: Family

## 2015-07-14 ENCOUNTER — Ambulatory Visit (INDEPENDENT_AMBULATORY_CARE_PROVIDER_SITE_OTHER): Payer: PPO | Admitting: Family

## 2015-07-14 ENCOUNTER — Other Ambulatory Visit (HOSPITAL_COMMUNITY): Payer: Medicare Other

## 2015-07-14 VITALS — BP 142/81 | HR 72 | Temp 98.2°F | Resp 18 | Ht 72.0 in | Wt 240.0 lb

## 2015-07-14 DIAGNOSIS — Z48812 Encounter for surgical aftercare following surgery on the circulatory system: Secondary | ICD-10-CM | POA: Diagnosis not present

## 2015-07-14 DIAGNOSIS — I779 Disorder of arteries and arterioles, unspecified: Secondary | ICD-10-CM | POA: Diagnosis not present

## 2015-07-14 DIAGNOSIS — I6523 Occlusion and stenosis of bilateral carotid arteries: Secondary | ICD-10-CM | POA: Diagnosis not present

## 2015-07-14 DIAGNOSIS — Z9889 Other specified postprocedural states: Secondary | ICD-10-CM | POA: Diagnosis not present

## 2015-07-14 DIAGNOSIS — I10 Essential (primary) hypertension: Secondary | ICD-10-CM | POA: Insufficient documentation

## 2015-07-14 DIAGNOSIS — I739 Peripheral vascular disease, unspecified: Secondary | ICD-10-CM

## 2015-07-14 DIAGNOSIS — E785 Hyperlipidemia, unspecified: Secondary | ICD-10-CM | POA: Diagnosis not present

## 2015-07-14 DIAGNOSIS — E119 Type 2 diabetes mellitus without complications: Secondary | ICD-10-CM | POA: Insufficient documentation

## 2015-07-14 NOTE — Patient Instructions (Signed)
Stroke Prevention Some medical conditions and behaviors are associated with an increased chance of having a stroke. You may prevent a stroke by making healthy choices and managing medical conditions. HOW CAN I REDUCE MY RISK OF HAVING A STROKE?   Stay physically active. Get at least 30 minutes of activity on most or all days.  Do not smoke. It may also be helpful to avoid exposure to secondhand smoke.  Limit alcohol use. Moderate alcohol use is considered to be:  No more than 2 drinks per day for men.  No more than 1 drink per day for nonpregnant women.  Eat healthy foods. This involves:  Eating 5 or more servings of fruits and vegetables a day.  Making dietary changes that address high blood pressure (hypertension), high cholesterol, diabetes, or obesity.  Manage your cholesterol levels.  Making food choices that are high in fiber and low in saturated fat, trans fat, and cholesterol may control cholesterol levels.  Take any prescribed medicines to control cholesterol as directed by your health care provider.  Manage your diabetes.  Controlling your carbohydrate and sugar intake is recommended to manage diabetes.  Take any prescribed medicines to control diabetes as directed by your health care provider.  Control your hypertension.  Making food choices that are low in salt (sodium), saturated fat, trans fat, and cholesterol is recommended to manage hypertension.  Ask your health care provider if you need treatment to lower your blood pressure. Take any prescribed medicines to control hypertension as directed by your health care provider.  If you are 18-39 years of age, have your blood pressure checked every 3-5 years. If you are 40 years of age or older, have your blood pressure checked every year.  Maintain a healthy weight.  Reducing calorie intake and making food choices that are low in sodium, saturated fat, trans fat, and cholesterol are recommended to manage  weight.  Stop drug abuse.  Avoid taking birth control pills.  Talk to your health care provider about the risks of taking birth control pills if you are over 35 years old, smoke, get migraines, or have ever had a blood clot.  Get evaluated for sleep disorders (sleep apnea).  Talk to your health care provider about getting a sleep evaluation if you snore a lot or have excessive sleepiness.  Take medicines only as directed by your health care provider.  For some people, aspirin or blood thinners (anticoagulants) are helpful in reducing the risk of forming abnormal blood clots that can lead to stroke. If you have the irregular heart rhythm of atrial fibrillation, you should be on a blood thinner unless there is a good reason you cannot take them.  Understand all your medicine instructions.  Make sure that other conditions (such as anemia or atherosclerosis) are addressed. SEEK IMMEDIATE MEDICAL CARE IF:   You have sudden weakness or numbness of the face, arm, or leg, especially on one side of the body.  Your face or eyelid droops to one side.  You have sudden confusion.  You have trouble speaking (aphasia) or understanding.  You have sudden trouble seeing in one or both eyes.  You have sudden trouble walking.  You have dizziness.  You have a loss of balance or coordination.  You have a sudden, severe headache with no known cause.  You have new chest pain or an irregular heartbeat. Any of these symptoms may represent a serious problem that is an emergency. Do not wait to see if the symptoms will   go away. Get medical help at once. Call your local emergency services (911 in [place] Do not drive yourself to the hospital.   This information is not intended to replace advice given to you by your health care provider. Make sure you discuss any questions you have with your health care provider.   Document Released: 10/14/2004 Document Revised: 09/27/2014 Document Reviewed:  03/09/2013 Elsevier Interactive Patient Education 2016 Elsevier Inc.  

## 2015-07-14 NOTE — Progress Notes (Addendum)
Established Carotid Patient   History of Present Illness  [name] is a 69 y.o. male patient of Dr. [name] who has known carotid stenosis is here for f/u carotid duplex scan after left CEA on 11/29/13.  He states that he has undergone angiogram by Dr. Gwenlyn Found with "roto rooter" of his right leg. He states that he does have problems with neuropathy and restless leg syndrome. He states that he goes to the podiatrist for foot care. He reports a stroke in 2013 or 2014 as manifested by expressive aphasia, no strokes or TIA's subsequently.  He denies any history of amaurosis fugax, paresthesias, or hemiparesis.  He is on a statin for his hypercholesterolemia. He is on po medications for his diabetes. He is on multiple medications for hypertension.  Pt states he is working with Dr. Luan Pulling at adjusting his blood pressure medications as relates to his light headedness.  He states that he may have bronchitis again.  The patient denies New Medical or Surgical History. Pt states he has had left leg lag that is not worsening, unclear etiology.  Pt Diabetic: Yes, seems in fairly good control, states his last A1C was 6.1 Pt smoker: former smoker  Pt meds include: Statin : Yes ASA: Yes Other anticoagulants/antiplatelets: Plavix  Past Medical History  Diagnosis Date  . Hypertension   . Diabetes mellitus   . Carotid artery occlusion   . Stroke [facility]) March 08, 2012  . Hyperlipidemia   . Peripheral arterial disease (HCC)     nonhealing ulcers bilaterally on each great toe  . PONV (postoperative nausea and vomiting)   . Arthritis   . Hypothyroidism   . Shortness of breath   . Obstructive sleep apnea     Social History Social History  Substance Use Topics  . Smoking status: Former Smoker -- 1 years    Types: Pipe    Quit date: 07/08/1977  . Smokeless tobacco: Never Used  . Alcohol Use: No    Family History Family History  Problem Relation Age of Onset  . Heart disease  Mother   . Hypertension Mother   . Heart attack Mother   . Diabetes Son   . Heart disease Son   . Hypertension Son   . Hypertension Father   . Diabetes Father     Surgical History Past Surgical History  Procedure Laterality Date  . Back surgery    . Cervical fusion    . Foot surgery    . Spine surgery    . Eye surgery    . Endarterectomy Left 11/29/2013    Procedure: ENDARTERECTOMY CAROTID;  Surgeon: Serafina Mitchell, MD;  Location: [facility] OR;  Service: Vascular;  Laterality: Left;  . Carotid endarterectomy Left   . Pv angiogram  02/18/2014    tibial vessel diseas bil.  . Angioplasty  02/28/14    diamond back orbital rotational atherectomy of Rt. tibial  . Lower ext duplex doppler  03/14/14    Rt ABI 1.2  . Lower extremity angiogram Bilateral 02/18/2014    Procedure: LOWER EXTREMITY ANGIOGRAM;  Surgeon: [name], MD;  Location: [facility] CATH LAB;  Service: Cardiovascular;  Laterality: Bilateral;  . Lower extremity angiogram N/A 10/31/2014    Procedure: LOWER EXTREMITY ANGIOGRAM;  Surgeon: [name], MD;  Location: [facility] CATH LAB;  Service: Cardiovascular;  Laterality: N/A;    Allergies  Allergen Reactions  . Codeine Nausea And Vomiting  . Tramadol Nausea Only    Current Outpatient Prescriptions  Medication Sig Dispense Refill  . amLODipine (NORVASC) 10 MG tablet Take 10 mg by mouth daily.     [name] aspirin EC 81 MG tablet Take 1 tablet (81 mg total) by mouth daily.    [name] b complex vitamins tablet Take 1 tablet by mouth daily.    . clindamycin (CLEOCIN) 300 MG capsule Take 1 capsule (300 mg total) by mouth 4 (four) times daily. 20 capsule 0  . clopidogrel (PLAVIX) 75 MG tablet Take 75 mg by mouth daily.     . dapagliflozin propanediol (FARXIGA) 5 MG TABS tablet Take 5 mg by mouth daily.    . DULoxetine (CYMBALTA) 20 MG capsule Take 1 capsule by mouth 2 (two) times daily.    . Empagliflozin-Linagliptin 10-5 MG TABS Take 1 tablet by mouth daily.    [name] gabapentin (NEURONTIN) 600 MG  tablet Take 600 mg by mouth 4 (four) times daily.     [name] levothyroxine (SYNTHROID, LEVOTHROID) 25 MCG tablet Take 25 mcg by mouth daily.     . Misc Natural Products (OSTEO BI-FLEX JOINT SHIELD PO) Take 1 tablet by mouth 2 (two) times daily.    . mupirocin cream (BACTROBAN) 2 % Apply topically daily. 15 g 0  . Omega 3-6-9 Fatty Acids (OMEGA 3-6-9 COMPLEX) CAPS Take 1 capsule by mouth 2 (two) times daily.    [name] oxyCODONE (ROXICODONE) 5 MG immediate release tablet Take 1 tablet (5 mg total) by mouth every 6 (six) hours as needed for severe pain. 30 tablet 0  . pravastatin (PRAVACHOL) 40 MG tablet Take 40 mg by mouth every morning.     [name] rOPINIRole (REQUIP) 0.5 MG tablet Take 1 tablet (0.5 mg total) by mouth at bedtime. 30 tablet 11  . saccharomyces boulardii (FLORASTOR) 250 MG capsule Take 1 capsule (250 mg total) by mouth 2 (two) times daily. 60 capsule 2  . sitaGLIPtin (JANUVIA) 100 MG tablet Take 100 mg by mouth daily.     No current facility-administered medications for this visit.    Review of Systems : See HPI for pertinent positives and negatives.  Physical Examination  Filed Vitals:   07/14/15 1548 07/14/15 1551 07/14/15 1552  BP: 154/89 145/80 142/81  Pulse: 72 72 72  Temp: 98.2 F (36.8 C)    Resp: 18    Height: 6' (1.829 m)    Weight: 240 lb (108.863 kg)    SpO2: 96%     Body mass index is 32.54 kg/(m^2).  General: WDWN obese male in NAD GAIT: left leg lag Eyes: PERRLA Pulmonary: Non-labored, limited air movement in right posterior fields; rhonchi in upper right posterior and right anterior fields, left anterior and posterior lung fields are clear. Moist cough.   Cardiac: regular rhythm, no detected murmur.  VASCULAR EXAM Carotid Bruits Left Right   Negative Negative   Radial pulses are 2+ palpable and equal.      LE Pulses  LEFT RIGHT   POPLITEAL not palpable  not palpable       PT palpable palpable       DP palpable palpable    Gastrointestinal: soft, nontender, BS WNL, no r/g,no palpable masses.  Musculoskeletal: Negative muscle atrophy/wasting. M/S 5/5 throughout, Extremities without ischemic changes.  Neurologic: A&O X 3; Appropriate Affect ; SENSATION ;normal;  Speech is normal CN 2-12 intact except some numbness at left side of mouth, Pain and light touch intact in extremities, Motor exam as listed above.          Non-Invasive Vascular  Imaging CAROTID DUPLEX 07/14/2015   CEREBROVASCULAR DUPLEX EVALUATION    INDICATION: Carotid artery disease    PREVIOUS INTERVENTION(S): Left carotid endarterectomy 11/29/2013    DUPLEX EXAM: Carotid duplex    RIGHT  LEFT  Peak Systolic Velocities (cm/s) End Diastolic Velocities (cm/s) Plaque LOCATION Peak Systolic Velocities (cm/s) End Diastolic Velocities (cm/s) Plaque  100 13 - CCA PROXIMAL 132 24 -  103 21 - CCA MID 124 25 -  82 19 - CCA DISTAL 111 24 HM  102 11 - ECA 244 18 HM  68 21 HT ICA PROXIMAL 108 19 -  70 24 - ICA MID 89 32 -  51 14 - ICA DISTAL 53 15 -    .67 ICA / CCA Ratio (PSV) .87  Antegrade Vertebral Flow Antegrade  - Brachial Systolic Pressure (mmHg) -  Triphasic Brachial Artery Waveforms Triphasic    Plaque Morphology:  HM = Homogeneous, HT = Heterogeneous, CP = Calcific Plaque, SP = Smooth Plaque, IP = Irregular Plaque     ADDITIONAL  FINDINGS:     IMPRESSION: 1. Less than 40% right internal carotid artery stenosis. 2. Patent left carotid endarterectomy site with no evidence for restenosis. 3. Left external carotid artery stenosis.    Compared to the previous exam:  No significant change since prior exam of 07/08/2014       Assessment: SYLVANUS TELFORD is a 69 y.o. male who is s/  left CEA on 11/29/13. He had a stroke in 2013 or 2014 as manifested by expressive aphasia, no strokes or TIA's  subsequently. Today's carotid duplex suggests less than 40% right internal carotid artery stenosis and patent left carotid endarterectomy site with no evidence for restenosis.  No significant change since prior exam of 07/08/2014.   Lower limb arterial disease s/p atherectomy right leg per Dr. Gwenlyn Found.  I advised pt to see Dr. Luan Pulling as soon as possible re his moist cough (rhochi in upper right posterior fields and right anterior fields, limited air movement in all right lung fields).   Plan: Follow-up in 1 year with Carotid Duplex scan.   I discussed in depth with the patient the nature of atherosclerosis, and emphasized the importance of maximal medical management including strict control of blood pressure, blood glucose, and lipid levels, obtaining regular exercise, and continued cessation of smoking.  The patient is aware that without maximal medical management the underlying atherosclerotic disease process will progress, limiting the benefit of any interventions. The patient was given information about stroke prevention and what symptoms should prompt the patient to seek immediate medical care. Thank you for allowing [place] to participate in this patient's care.  Christopher Chambers, RN, MSN, FNP-C [facility] Office: [phone]  Clinic Physician: [name]  07/14/2015 3:53 PM

## 2015-07-15 ENCOUNTER — Ambulatory Visit (HOSPITAL_COMMUNITY): Payer: PPO | Admitting: Physical Therapy

## 2015-07-15 DIAGNOSIS — M6289 Other specified disorders of muscle: Secondary | ICD-10-CM

## 2015-07-15 DIAGNOSIS — T148XXA Other injury of unspecified body region, initial encounter: Secondary | ICD-10-CM

## 2015-07-15 NOTE — Addendum Note (Signed)
Addended by: Mena Goes on: 07/15/2015 05:23 PM   Modules accepted: Orders

## 2015-07-15 NOTE — Therapy (Signed)
[name], [place], [address] Phone: [phone]   Fax:  [phone]  Physical Therapy Treatment  Patient Details  Name: [name] MRN: [id number] Date of Birth: Jun 17, 1946 No Data Recorded  Encounter Date: 07/15/2015      PT End of Session - 07/15/15 0946    Visit Number 67   Number of Visits 80   Date for PT Re-Evaluation 06/19/15   Authorization Type medicare   Authorization Time Period Gcode done on 47 th visit   Authorization - Visit Number 55   Authorization - Number of Visits 41   PT Start Time 0803   PT Stop Time 0850   PT Time Calculation (min) 47 min      Past Medical History  Diagnosis Date  . Hypertension   . Diabetes mellitus   . Carotid artery occlusion   . Stroke [facility]) March 08, 2012  . Hyperlipidemia   . Peripheral arterial disease (HCC)     nonhealing ulcers bilaterally on each great toe  . PONV (postoperative nausea and vomiting)   . Arthritis   . Hypothyroidism   . Shortness of breath   . Obstructive sleep apnea     Past Surgical History  Procedure Laterality Date  . Back surgery    . Cervical fusion    . Foot surgery    . Spine surgery    . Eye surgery    . Endarterectomy Left 11/29/2013    Procedure: ENDARTERECTOMY CAROTID;  Surgeon: Christopher Mitchell, MD;  Location: [facility] OR;  Service: Vascular;  Laterality: Left;  . Carotid endarterectomy Left   . Pv angiogram  02/18/2014    tibial vessel diseas bil.  . Angioplasty  02/28/14    diamond back orbital rotational atherectomy of Rt. tibial  . Lower ext duplex doppler  03/14/14    Rt ABI 1.2  . Lower extremity angiogram Bilateral 02/18/2014    Procedure: LOWER EXTREMITY ANGIOGRAM;  Surgeon: Christopher Harp, MD;  Location: [facility] CATH LAB;  Service: Cardiovascular;  Laterality: Bilateral;  . Lower extremity angiogram N/A 10/31/2014    Procedure: LOWER EXTREMITY ANGIOGRAM;  Surgeon: Christopher Harp, MD;  Location: [facility] CATH LAB;   Service: Cardiovascular;  Laterality: N/A;    There were no vitals filed for this visit.  Visit Diagnosis:  Nonhealing nonsurgical wound with necrosis of muscle                     Wound Therapy - 07/15/15 0938    Subjective Pt took his last antibiotic yesterday.    Patient and Family Stated Goals wounds to heal    Prior Treatments podiatrists self care.   Pain Assessment No/denies pain   Pressure Ulcer Properties Date First Assessed: 02/25/15 Location: Toe (Comment  which one) Location Orientation: Left Staging: Stage III -  Full thickness tissue loss. Subcutaneous fat may be visible but bone, tendon or muscle are NOT exposed.   Wound Properties Date First Assessed: 03/14/14 Time First Assessed: 1930 Wound Type: Diabetic ulcer Location: Other (Comment) , r big toe  Location Orientation: Right Wound Description (Comments): nonhealing wound on planar aspct of MTP of great toe  Present on Admission: Yes   Dressing Type Moist to moist  packing wound with moistened 2x2; moistened with CarraKlenz   Dressing Changed Changed   Dressing Status Old drainage   Dressing Change Frequency Daily   Site / Wound Assessment Red   % Wound  base Red or Granulating 100%   % Wound base Yellow 0%   Peri-wound Assessment Other (Comment)  callous    Wound Length (cm) 1.1 cm   Wound Width (cm) 1.1 cm   Wound Depth (cm) 0.7 cm   Undermining (cm) from 9-3 0'clock; 12 to 1 is 1 cm ; Lat .3 cm ; medially .5 cm    Margins Epibole (rolled edges)   Drainage Amount Minimal   Drainage Description Serous   Treatment Cleansed;Debridement (Selective);Other (Comment)  packed with Carraklenz soaked gauze.    Selective Debridement - Location callous    Selective Debridement - Tools Used Forceps;Scalpel   Selective Debridement - Tissue Removed callous around wound    Wound Therapy - Clinical Statement Pt wound has increased undermining to the point where wound should now be packed with moistened gauze.   Pt session limited secondary to significant restless leg syndrome today.  Pt to continue one time a week to debride callouse maintain a healing environment and prevent deterioration of wound.    Wound Therapy - Functional Problem List easier to ambulate.    Factors Delaying/Impairing Wound Healing Altered sensation;Diabetes Mellitus;Infection - systemic/local;Immobility;Multiple medical problems;Polypharmacy   Hydrotherapy Plan Debridement;Dressing change;Patient/family education   Wound Therapy - Frequency --  once a week for 5-6 more weeks    Wound Therapy - Current Recommendations PT   Wound Plan begin packing wound    Dressing  packed with moist to moist dressing    Decrease Necrotic Tissue to STG: 4 weeks: 0%   Decrease Necrotic Tissue - Progress Progressing toward goal   Increase Granulation Tissue to STG: 4 weeks 100%   Increase Granulation Tissue - Progress Progressing toward goal   Decrease Length/Width/Depth by (cm) STG: decrease Lt to have no tunnelling ; Rt to be decresed to 1.0x .75 x .1 STG for 4 weeks;  LTG healed 8 weeks   Decrease Length/Width/Depth - Progress Progressing toward goal                              Problem List Patient Active Problem List   Diagnosis Date Noted  . Nonhealing skin ulcer ([place]) 10/28/2014  . PAD (peripheral artery disease) ([place]) 10/28/2014  . Bilateral carotid artery disease ([place]) 07/08/2014  . Critical lower limb ischemia 02/28/2014  . Obstructive sleep apnea 02/12/2014  . Aftercare following surgery of the circulatory system, [place] 12/04/2013  . Carotid stenosis 11/29/2013  . Restless leg syndrome 10/23/2013  . Sleep apnea 10/23/2013  . Obesity (BMI 30.0-34.9) 10/23/2013  . Hyperlipidemia 07/16/2013  . Peripheral arterial disease ([place]) 07/16/2013  . Occlusion and stenosis of carotid artery without mention of cerebral infarction 04/24/2012  . History of stroke June 2013 03/14/2012  . Hypertension 03/14/2012  .  Type 2 diabetes mellitus with circulatory disorder ([place]) 03/14/2012  . Peripheral neuropathy ([place]) 03/14/2012  . Cardiomyopathy- EF NL 5/14 03/14/2012  . ARTHRITIS, RIGHT FOOT 06/26/2008    [name], PT CLT [phone] 07/15/2015, 9:47 AM  [place] [address] [place], [place], [address] Phone: [phone]   Fax:  [phone]  Name: CARVELL HOEFFNER MRN: [id number] Date of Birth: Oct 19, 1945

## 2015-07-22 ENCOUNTER — Ambulatory Visit (HOSPITAL_COMMUNITY): Payer: PPO | Attending: Podiatry | Admitting: Physical Therapy

## 2015-07-22 DIAGNOSIS — M6289 Other specified disorders of muscle: Secondary | ICD-10-CM

## 2015-07-22 DIAGNOSIS — T148 Other injury of unspecified body region: Secondary | ICD-10-CM | POA: Insufficient documentation

## 2015-07-22 DIAGNOSIS — T148XXA Other injury of unspecified body region, initial encounter: Secondary | ICD-10-CM

## 2015-07-22 NOTE — Therapy (Signed)
[place], [place], [address] Phone: [phone]   Fax:  [phone]  Wound Care Therapy  Patient Details  Name: [name] MRN: [id number] Date of Birth: 1945/12/03 No Data Recorded  Encounter Date: 07/22/2015      PT End of Session - 07/22/15 1151    Visit Number 64   Number of Visits 85   Date for PT Re-Evaluation 07/29/15   Authorization Type medicare   Authorization Time Period Gcode done on 57 th visit   Authorization - Visit Number 56   Authorization - Number of Visits 47   PT Start Time 0803   PT Stop Time 0835   PT Time Calculation (min) 32 min   Activity Tolerance Patient tolerated treatment well   Behavior During Therapy [facility] for tasks assessed/performed      Past Medical History  Diagnosis Date  . Hypertension   . Diabetes mellitus   . Carotid artery occlusion   . Stroke [facility]) March 08, 2012  . Hyperlipidemia   . Peripheral arterial disease (HCC)     nonhealing ulcers bilaterally on each great toe  . PONV (postoperative nausea and vomiting)   . Arthritis   . Hypothyroidism   . Shortness of breath   . Obstructive sleep apnea     Past Surgical History  Procedure Laterality Date  . Back surgery    . Cervical fusion    . Foot surgery    . Spine surgery    . Eye surgery    . Endarterectomy Left 11/29/2013    Procedure: ENDARTERECTOMY CAROTID;  Surgeon: Serafina Mitchell, MD;  Location: [facility] OR;  Service: Vascular;  Laterality: Left;  . Carotid endarterectomy Left   . Pv angiogram  02/18/2014    tibial vessel diseas bil.  . Angioplasty  02/28/14    diamond back orbital rotational atherectomy of Rt. tibial  . Lower ext duplex doppler  03/14/14    Rt ABI 1.2  . Lower extremity angiogram Bilateral 02/18/2014    Procedure: LOWER EXTREMITY ANGIOGRAM;  Surgeon: [name], MD;  Location: [facility] CATH LAB;  Service: Cardiovascular;  Laterality: Bilateral;  . Lower extremity angiogram N/A  10/31/2014    Procedure: LOWER EXTREMITY ANGIOGRAM;  Surgeon: [name], MD;  Location[facility] CATH LAB;  Service: Cardiovascular;  Laterality: N/A;    There were no vitals filed for this visit.  Visit Diagnosis:  Nonhealing nonsurgical wound with necrosis of muscle                 Wound Therapy - 07/22/15 1146    Subjective Patient reports he is doing well today, but his restless leg syndrome has really been acting up recently; no pain today    Patient and Family Stated Goals wounds to heal    Prior Treatments podiatrists self care.   Pain Assessment No/denies pain   Pressure Ulcer Properties Date First Assessed: 02/25/15 Location: Toe (Comment  which one) Location Orientation: Left Staging: Stage III -  Full thickness tissue loss. Subcutaneous fat may be visible but bone, tendon or muscle are NOT exposed.   Wound Properties Date First Assessed: 03/14/14 Time First Assessed: 1930 Wound Type: Diabetic ulcer Location: Other (Comment) , r big toe  Location Orientation: Right Wound Description (Comments): nonhealing wound on planar aspct of MTP of great toe  Present on Admission: Yes   Dressing Type Moist to moist   Dressing Changed Changed   Dressing Status  Old drainage   Dressing Change Frequency Daily   Site / Wound Assessment Red   % Wound base Red or Granulating 100%   % Wound base Yellow 0%   Undermining (cm) continue to note undermining from 9-3   Margins Epibole (rolled edges)   Drainage Amount Minimal   Drainage Description Serous   Treatment Cleansed;Debridement (Selective);Other (Comment)  cleansed, debrided callous, packed with Carra-klenz gauze    Selective Debridement - Location callous    Selective Debridement - Tools Used Forceps;Scalpel   Selective Debridement - Tissue Removed callous around wound    Wound Therapy - Clinical Statement Patient continues to demonstrate significant callous buildup around his wound, which is likely preventing full wound  healing; also contiinue to note underminging approximately 9-3. No pain today however extent of debridement/treatment was limited due to interference of restless leg syndrome with PT's ability to safely debride. Packed wound with Carra-Klenz saturarted gauze.    Wound Therapy - Functional Problem List easier to ambulate    Factors Delaying/Impairing Wound Healing Altered sensation;Diabetes Mellitus;Infection - systemic/local;Immobility;Multiple medical problems;Polypharmacy   Hydrotherapy Plan Debridement;Dressing change;Patient/family education   Wound Therapy - Current Recommendations PT   Wound Plan continue packing wound    Dressing  packed with moist to moist dressing    Decrease Necrotic Tissue to STG: 4 weeks: 0%   Decrease Necrotic Tissue - Progress Progressing toward goal   Increase Granulation Tissue to STG: 4 weeks 100%   Increase Granulation Tissue - Progress Progressing toward goal   Decrease Length/Width/Depth by (cm) STG: decrease Lt to have no tunnelling ; Rt to be decresed to 1.0x .75 x .1 STG for 4 weeks;  LTG healed 8 weeks   Decrease Length/Width/Depth - Progress Progressing toward goal                              Problem List Patient Active Problem List   Diagnosis Date Noted  . Nonhealing skin ulcer ([place]) 10/28/2014  . PAD (peripheral artery disease) ([place]) 10/28/2014  . Bilateral carotid artery disease ([place]) 07/08/2014  . Critical lower limb ischemia 02/28/2014  . Obstructive sleep apnea 02/12/2014  . Aftercare following surgery of the circulatory system, [place] 12/04/2013  . Carotid stenosis 11/29/2013  . Restless leg syndrome 10/23/2013  . Sleep apnea 10/23/2013  . Obesity (BMI 30.0-34.9) 10/23/2013  . Hyperlipidemia 07/16/2013  . Peripheral arterial disease ([place]) 07/16/2013  . Occlusion and stenosis of carotid artery without mention of cerebral infarction 04/24/2012  . History of stroke June 2013 03/14/2012  . Hypertension 03/14/2012  .  Type 2 diabetes mellitus with circulatory disorder ([place]) 03/14/2012  . Peripheral neuropathy ([place]) 03/14/2012  . Cardiomyopathy- EF NL 5/14 03/14/2012  . ARTHRITIS, RIGHT FOOT 06/26/2008    Deniece Ree PT, DPT [place] [address] [place], [place], [address] Phone: [phone]   Fax:  [phone]  Name: [name] MRN: [id number] Date of Birth: [birth year]

## 2015-07-29 ENCOUNTER — Ambulatory Visit (HOSPITAL_COMMUNITY): Payer: PPO

## 2015-07-30 NOTE — Telephone Encounter (Signed)
Called concerning apt date and time  Zooey Schreurs, LPTA; CBIS [phone]  

## 2015-07-31 ENCOUNTER — Ambulatory Visit (HOSPITAL_COMMUNITY): Payer: PPO | Admitting: Physical Therapy

## 2015-07-31 DIAGNOSIS — M6289 Other specified disorders of muscle: Secondary | ICD-10-CM

## 2015-07-31 DIAGNOSIS — T148XXA Other injury of unspecified body region, initial encounter: Secondary | ICD-10-CM

## 2015-07-31 NOTE — Therapy (Signed)
[place], [place], [address] Phone: [phone]   Fax:  [phone]  Wound Care Therapy  Patient Details  Name: [name] MRN: [id number] Date of Birth: 1946-08-14 No Data Recorded  Encounter Date: 07/31/2015      PT End of Session - 07/31/15 1648    Visit Number 41   Number of Visits 69   Date for PT Re-Evaluation 08/26/15   Authorization Type medicare   Authorization Time Period Gcode done on 57 th visit   Authorization - Visit Number 57   Authorization - Number of Visits 67   PT Start Time 1600   PT Stop Time 1640   PT Time Calculation (min) 40 min   Activity Tolerance Patient tolerated treatment well   Behavior During Therapy [facility] for tasks assessed/performed      Past Medical History  Diagnosis Date  . Hypertension   . Diabetes mellitus   . Carotid artery occlusion   . Stroke [facility]) March 08, 2012  . Hyperlipidemia   . Peripheral arterial disease (HCC)     nonhealing ulcers bilaterally on each great toe  . PONV (postoperative nausea and vomiting)   . Arthritis   . Hypothyroidism   . Shortness of breath   . Obstructive sleep apnea     Past Surgical History  Procedure Laterality Date  . Back surgery    . Cervical fusion    . Foot surgery    . Spine surgery    . Eye surgery    . Endarterectomy Left 11/29/2013    Procedure: ENDARTERECTOMY CAROTID;  Surgeon: Serafina Mitchell, MD;  Location: [facility] OR;  Service: Vascular;  Laterality: Left;  . Carotid endarterectomy Left   . Pv angiogram  02/18/2014    tibial vessel diseas bil.  . Angioplasty  02/28/14    diamond back orbital rotational atherectomy of Rt. tibial  . Lower ext duplex doppler  03/14/14    Rt ABI 1.2  . Lower extremity angiogram Bilateral 02/18/2014    Procedure: LOWER EXTREMITY ANGIOGRAM;  Surgeon: [name], MD;  Location: [facility] CATH LAB;  Service: Cardiovascular;  Laterality: Bilateral;  . Lower extremity angiogram N/A  10/31/2014    Procedure: LOWER EXTREMITY ANGIOGRAM;  Surgeon: [name], MD;  Location: [facility] CATH LAB;  Service: Cardiovascular;  Laterality: N/A;    There were no vitals filed for this visit.  Visit Diagnosis:  Nonhealing nonsurgical wound with necrosis of muscle                 Wound Therapy - 07/31/15 1644    Subjective Pt states his mother died 2023/01/02 and has been at the nursing home all day today gathering her belongings.  Pt has not been for woundcare in 9 days   Patient and Family Stated Goals wounds to heal    Prior Treatments podiatrists self care.   Pain Assessment No/denies pain   Pressure Ulcer Properties Date First Assessed: 02/25/15 Location: Toe (Comment  which one) Location Orientation: Left Staging: Stage III -  Full thickness tissue loss. Subcutaneous fat may be visible but bone, tendon or muscle are NOT exposed.   Wound Properties Date First Assessed: 03/14/14 Time First Assessed: 1930 Wound Type: Diabetic ulcer Location: Other (Comment) , r big toe  Location Orientation: Right Wound Description (Comments): nonhealing wound on planar aspct of MTP of great toe  Present on Admission: Yes   Dressing Type Moist to moist  Dressing Changed Changed   Dressing Status Old drainage   Dressing Change Frequency Daily   Site / Wound Assessment Red   % Wound base Red or Granulating 100%   % Wound base Yellow 0%   Wound Length (cm) 1 cm   Wound Width (cm) 1 cm   Wound Depth (cm) 0.5 cm   Undermining (cm) approx 1.5cm of callous entire perimeter extending up between great toe and second toe.   Margins Epibole (rolled edges)   Drainage Amount Minimal   Drainage Description Serous   Treatment Cleansed;Debridement (Selective)   Selective Debridement - Location callous    Selective Debridement - Tools Used Forceps;Scalpel   Selective Debridement - Tissue Removed callous around wound    Wound Therapy - Clinical Statement Patient with increased maceration of callous  around wound today due to increased weight bearing and ambulation since his mothers death on January 08, 2023.  Removed significant amount of callous from perimeter of wound and now extends up between great toe and second toe of approximate size of 2.5cmX 0.5cm which is raw.  Also noted is corn between 4/5 toe has increased irritation from patient trying to remove tissue.  Instructed patient not to mess with this area and let the MD treat his corns.  Also instructed patient to keep his foot dry with several sock changes during the day as needed and decrease weight bearing time as it is not allowing wound to heal.  Unsure of success of healing if patient does not limit his weight bearing.      Wound Therapy - Functional Problem List easier to ambulate    Factors Delaying/Impairing Wound Healing Altered sensation;Diabetes Mellitus;Infection - systemic/local;Immobility;Multiple medical problems;Polypharmacy   Hydrotherapy Plan Debridement;Dressing change;Patient/family education   Wound Therapy - Current Recommendations PT   Wound Plan continue packing wound    Dressing  packed with moist to moist dressing    Decrease Necrotic Tissue to STG: 4 weeks: 0%   Decrease Necrotic Tissue - Progress Progressing toward goal   Increase Granulation Tissue to STG: 4 weeks 100%   Increase Granulation Tissue - Progress Progressing toward goal   Decrease Length/Width/Depth by (cm) STG: decrease Lt to have no tunnelling ; Rt to be decresed to 1.0x .75 x .1 STG for 4 weeks;  LTG healed 8 weeks   Decrease Length/Width/Depth - Progress Progressing toward goal                           Problem List Patient Active Problem List   Diagnosis Date Noted  . Nonhealing skin ulcer ([place]) 10/28/2014  . PAD (peripheral artery disease) ([place]) 10/28/2014  . Bilateral carotid artery disease ([place]) 07/08/2014  . Critical lower limb ischemia 02/28/2014  . Obstructive sleep apnea 02/12/2014  . Aftercare following surgery of  the circulatory system, [place] 12/04/2013  . Carotid stenosis 11/29/2013  . Restless leg syndrome 10/23/2013  . Sleep apnea 10/23/2013  . Obesity (BMI 30.0-34.9) 10/23/2013  . Hyperlipidemia 07/16/2013  . Peripheral arterial disease ([place]) 07/16/2013  . Occlusion and stenosis of carotid artery without mention of cerebral infarction 04/24/2012  . History of stroke June 2013 03/14/2012  . Hypertension 03/14/2012  . Type 2 diabetes mellitus with circulatory disorder ([place]) 03/14/2012  . Peripheral neuropathy ([place]) 03/14/2012  . Cardiomyopathy- EF NL 5/14 03/14/2012  . ARTHRITIS, RIGHT FOOT 06/26/2008    Teena Irani, PTA/CLT [phone]  2015-08-12, 6:56 PM       G-Codes - 2015/08/12  [id number]    Functional Assessment Tool Used Based on skilled clinical assessment, wound dimensions, slow wound healing progress    Functional Limitation Other PT primary   Other PT Primary Current Status [id number]) At least 1 percent but less than 20 percent impaired, limited or restricted   Other PT Primary Goal Status [id number]) At least 1 percent but less than 20 percent impaired, limited or restricted      Physical Therapy Progress Note  Dates of Reporting Period: 05/20/15 to 07/31/15  Objective Reports of Subjective Statement: see above   Objective Measurements: see above   Goal Update: see above   Plan: see above   Reason Skilled Services are Required: facilitate wound healing and prevent infection, patient education   Deniece Ree PT, DPT [place], [place], [address] Phone: [phone]   Fax:  [phone]  Name: LAURIER CAMBRON MRN: [id number] Date of Birth: [birth year]

## 2015-08-04 ENCOUNTER — Ambulatory Visit (HOSPITAL_COMMUNITY): Payer: PPO | Admitting: Physical Therapy

## 2015-08-04 DIAGNOSIS — M6289 Other specified disorders of muscle: Secondary | ICD-10-CM

## 2015-08-04 DIAGNOSIS — T148XXA Other injury of unspecified body region, initial encounter: Secondary | ICD-10-CM

## 2015-08-04 NOTE — Therapy (Signed)
[place], [place], [address] Phone: [phone]   Fax:  [phone]  Wound Care Therapy  Patient Details  Name: [name] MRN: [id number] Date of Birth: 12/06/1945 No Data Recorded  Encounter Date: 08/04/2015      PT End of Session - 08/04/15 0907    Visit Number 3   Number of Visits 42   Authorization Type [employer]   Authorization - Visit Number 67   Authorization - Number of Visits 6   PT Start Time 0800   PT Stop Time 0848   PT Time Calculation (min) 48 min   Activity Tolerance Patient tolerated treatment well      Past Medical History  Diagnosis Date  . Hypertension   . Diabetes mellitus   . Carotid artery occlusion   . Stroke [facility]) March 08, 2012  . Hyperlipidemia   . Peripheral arterial disease (HCC)     nonhealing ulcers bilaterally on each great toe  . PONV (postoperative nausea and vomiting)   . Arthritis   . Hypothyroidism   . Shortness of breath   . Obstructive sleep apnea     Past Surgical History  Procedure Laterality Date  . Back surgery    . Cervical fusion    . Foot surgery    . Spine surgery    . Eye surgery    . Endarterectomy Left 11/29/2013    Procedure: ENDARTERECTOMY CAROTID;  Surgeon: Serafina Mitchell, MD;  Location: [facility] OR;  Service: Vascular;  Laterality: Left;  . Carotid endarterectomy Left   . Pv angiogram  02/18/2014    tibial vessel diseas bil.  . Angioplasty  02/28/14    diamond back orbital rotational atherectomy of Rt. tibial  . Lower ext duplex doppler  03/14/14    Rt ABI 1.2  . Lower extremity angiogram Bilateral 02/18/2014    Procedure: LOWER EXTREMITY ANGIOGRAM;  Surgeon: [name], MD;  Location[facility] CATH LAB;  Service: Cardiovascular;  Laterality: Bilateral;  . Lower extremity angiogram N/A 10/31/2014    Procedure: LOWER EXTREMITY ANGIOGRAM;  Surgeon: [name], MD;  Location: [facility] CATH LAB;  Service: Cardiovascular;  Laterality: N/A;     There were no vitals filed for this visit.  Visit Diagnosis:  Nonhealing nonsurgical wound with necrosis of muscle                 Wound Therapy - 08/04/15 0853    Subjective Pt states he has no pain    Patient and Family Stated Goals wounds to heal    Prior Treatments podiatrists self care.   Pain Assessment No/denies pain   Pressure Ulcer Properties Date First Assessed: 02/25/15 Location: Toe (Comment  which one) Location Orientation: Left Staging: Stage III -  Full thickness tissue loss. Subcutaneous fat may be visible but bone, tendon or muscle are NOT exposed.   Wound Properties Date First Assessed: 03/14/14 Time First Assessed: 1930 Wound Type: Diabetic ulcer Location: Other (Comment) , r big toe  Location Orientation: Right Wound Description (Comments): nonhealing wound on planar aspct of MTP of great toe  Present on Admission: Yes   Dressing Type Silver dressings   Dressing Changed Changed   Dressing Status Old drainage   Dressing Change Frequency Daily   Site / Wound Assessment Red   % Wound base Red or Granulating 100%   % Wound base Yellow 0%   Peri-wound Assessment Other (Comment)  calloused halo surrounds  wound of .8 cm    Wound Length (cm) 1 cm   Wound Width (cm) 1.4 cm   Wound Depth (cm) 0.5 cm   Undermining (cm) .4 superiorly; .3 lateally, as well as medially was approximately 1.0 cm superiorly    Margins Epibole (rolled edges)   Drainage Amount Scant   Drainage Description Serous   Treatment Cleansed;Debridement (Selective);Other (Comment)   Selective Debridement - Location callous   Selective Debridement - Tools Used Forceps;Scalpel;Scissors   Selective Debridement - Tissue Removed callous   Wound Therapy - Clinical Statement Pt wound appears to have stagnated but in reality calloused area surrounding the wound is being debrided casusing decreased undermining but appearing as if wound has gotten bigger when in reality it has not.  Pt has two  additional wounds one ong the lateral aspect of his 3rd toe which is .25 cm and the 4th is an the nail bed of his second toe which is .2 cm.  Both of these wounds are the result of his 4th toe deviating medially.  Therapist stressed the importance of talking to his podiatrist on what the next step is due to the fact that we appear to be holding the line on his wound but we are not making any significant ground. Wound should be measured weekly for progress.  Do not rely on past measurement.    Wound Therapy - Functional Problem List Pt continues to state that it is easier for him to ambulate.    Factors Delaying/Impairing Wound Healing Altered sensation;Diabetes Mellitus;Infection - systemic/local;Immobility;Multiple medical problems;Polypharmacy   Hydrotherapy Plan Debridement;Dressing change;Patient/family education   Wound Therapy - Current Recommendations PT;Other (comment)   Wound Therapy - Follow Up Recommendations Other (comment)  return to podiatrist.    Wound Plan continue to see pt  one time a week until pt is able to see his podiatrist to ensure that foot stays infection free.    Dressing  vaseline to callouse area.  Wound site moistened and then silver placecd on wound bed followed by 2.2 and 3" kling    Decrease Necrotic Tissue to STG: 4 weeks: 0%   Decrease Necrotic Tissue - Progress Progressing toward goal   Increase Granulation Tissue to STG: 4 weeks 100%   Increase Granulation Tissue - Progress Progressing toward goal   Decrease Length/Width/Depth by (cm) STG: decrease Lt to have no tunnelling ; Rt to be decresed to 1.0x .75 x .1 STG for 4 weeks;  LTG healed 8 weeks   Decrease Length/Width/Depth - Progress Progressing toward goal   Additional Wound Therapy Goal Pt to state it is easier to walk 8 weeks    Additional Wound Therapy Goal - Progress Met                   Problem List Patient Active Problem List   Diagnosis Date Noted  . Nonhealing skin ulcer ([place])  10/28/2014  . PAD (peripheral artery disease) ([place]) 10/28/2014  . Bilateral carotid artery disease ([place]) 07/08/2014  . Critical lower limb ischemia 02/28/2014  . Obstructive sleep apnea 02/12/2014  . Aftercare following surgery of the circulatory system, [place] 12/04/2013  . Carotid stenosis 11/29/2013  . Restless leg syndrome 10/23/2013  . Sleep apnea 10/23/2013  . Obesity (BMI 30.0-34.9) 10/23/2013  . Hyperlipidemia 07/16/2013  . Peripheral arterial disease ([place]) 07/16/2013  . Occlusion and stenosis of carotid artery without mention of cerebral infarction 04/24/2012  . History of stroke June 2013 03/14/2012  . Hypertension 03/14/2012  . Type  2 diabetes mellitus with circulatory disorder ([place]) 03/14/2012  . Peripheral neuropathy ([place]) 03/14/2012  . Cardiomyopathy- EF NL 5/14 03/14/2012  . ARTHRITIS, RIGHT FOOT 06/26/2008    Rayetta Humphrey, PT CLT [phone] 08/04/2015, 9:09 AM  [place] [address] [place], [place], [address] Phone: [phone]   Fax:  [phone]  Name: [name] MRN: [id number] Date of Birth: 06/05/1946

## 2015-08-12 ENCOUNTER — Ambulatory Visit (HOSPITAL_COMMUNITY): Payer: PPO | Admitting: Physical Therapy

## 2015-08-12 DIAGNOSIS — T148XXA Other injury of unspecified body region, initial encounter: Secondary | ICD-10-CM

## 2015-08-12 DIAGNOSIS — M6289 Other specified disorders of muscle: Secondary | ICD-10-CM | POA: Diagnosis not present

## 2015-08-12 NOTE — Therapy (Signed)
[facility] [address] [place], [place], [address] Phone: [phone]   Fax:  [phone]  Wound Care Therapy  Patient Details  Name: [name] MRN: [id number] Date of Birth: 03/31/1946 No Data Recorded  Encounter Date: 08/12/2015      PT End of Session - 08/12/15 0851    Visit Number 59   Number of Visits 65   Date for PT Re-Evaluation 08/26/15   Authorization Type medi [employer]   Authorization Time Period Gcode done on 57 th visit   Authorization - Visit Number 59   Authorization - Number of Visits 65   PT Start Time 0804   PT Stop Time 0845   PT Time Calculation (min) 41 min   Activity Tolerance Patient tolerated treatment well      Past Medical History  Diagnosis Date  . Hypertension   . Diabetes mellitus   . Carotid artery occlusion   . Stroke (HCC) March 08, 2012  . Hyperlipidemia   . Peripheral arterial disease (HCC)     nonhealing ulcers bilaterally on each great toe  . PONV (postoperative nausea and vomiting)   . Arthritis   . Hypothyroidism   . Shortness of breath   . Obstructive sleep apnea     Past Surgical History  Procedure Laterality Date  . Back surgery    . Cervical fusion    . Foot surgery    . Spine surgery    . Eye surgery    . Endarterectomy Left 11/29/2013    Procedure: ENDARTERECTOMY CAROTID;  Surgeon: Vance W Brabham, MD;  Location: [facility] OR;  Service: Vascular;  Laterality: Left;  . Carotid endarterectomy Left   . Pv angiogram  02/18/2014    tibial vessel diseas bil.  . Angioplasty  02/28/14    diamond back orbital rotational atherectomy of Rt. tibial  . Lower ext duplex doppler  03/14/14    Rt ABI 1.2  . Lower extremity angiogram Bilateral 02/18/2014    Procedure: LOWER EXTREMITY ANGIOGRAM;  Surgeon: Jonathan J Berry, MD;  Location: [facility] CATH LAB;  Service: Cardiovascular;  Laterality: Bilateral;  . Lower extremity angiogram N/A 10/31/2014    Procedure: LOWER EXTREMITY ANGIOGRAM;  Surgeon:  Jonathan J Berry, MD;  Location: [facility] CATH LAB;  Service: Cardiovascular;  Laterality: N/A;    There were no vitals filed for this visit.  Visit Diagnosis:  Nonhealing nonsurgical wound with necrosis of muscle                 Wound Therapy - 08/12/15 0846    Subjective Pt states he has no pain    Patient and Family Stated Goals wounds to heal    Prior Treatments podiatrists self care.   Pressure Ulcer Properties Date First Assessed: 02/25/15 Location: Toe (Comment  which one) Location Orientation: Left Staging: Stage III -  Full thickness tissue loss. Subcutaneous fat may be visible but bone, tendon or muscle are NOT exposed.   Wound Properties Date First Assessed: 03/14/14 Time First Assessed: 1930 Wound Type: Diabetic ulcer Location: Other (Comment) , r big toe  Location Orientation: Right Wound Description (Comments): nonhealing wound on planar aspct of MTP of great toe  Present on Admission: Yes   Dressing Type Gauze (Comment)  honey into wound bed   Dressing Status Old drainage   Dressing Change Frequency Daily   Site / Wound Assessment Red   % Wound base Red or Granulating 100%   % Wound base   Yellow 0%   Peri-wound Assessment Other (Comment)  calloused halo surrounds wound of .8 cm    Wound Length (cm) 1.1 cm   Wound Width (cm) 1.1 cm   Wound Depth (cm) 0.6 cm   Undermining (cm) from 9 to 3 o'clock;  Max at 12 at .3 cm    Margins Epibole (rolled edges)   Drainage Amount Scant   Drainage Description Serous   Treatment Cleansed;Debridement (Selective)   Selective Debridement - Location callous  epiboled edges   Selective Debridement - Tools Used Forceps;Scalpel;Scissors   Selective Debridement - Tissue Removed callous   Wound Therapy - Clinical Statement Pt callous continues to decrease with debridement.  Pt wound bed continues to be about the same.    Wound Therapy - Functional Problem List Pt continues to state that it is easier for him to ambulate.    Factors  Delaying/Impairing Wound Healing Altered sensation;Diabetes Mellitus;Infection - systemic/local;Immobility;Multiple medical problems;Polypharmacy   Hydrotherapy Plan Debridement;Dressing change;Patient/family education   Wound Therapy - Current Recommendations PT;Other (comment)   Wound Therapy - Follow Up Recommendations Other (comment)  return to podiatrist.    Wound Plan continue to see pt  one time a week until pt is able to see his podiatrist to ensure that foot stays infection free.    Dressing  vaseline to the periphery with honeygel placed into wound bed followed by 4x4 and wrapped with 3" kling    Decrease Necrotic Tissue to STG: 4 weeks: 0%   Decrease Necrotic Tissue - Progress Progressing toward goal   Increase Granulation Tissue to STG: 4 weeks 100%   Increase Granulation Tissue - Progress Progressing toward goal   Decrease Length/Width/Depth by (cm) STG: decrease Lt to have no tunnelling ; Rt to be decresed to 1.0x .75 x .1 STG for 4 weeks;  LTG healed 8 weeks   Decrease Length/Width/Depth - Progress Progressing toward goal   Additional Wound Therapy Goal Pt to state it is easier to walk 8 weeks    Additional Wound Therapy Goal - Progress Met            Problem List Patient Active Problem List   Diagnosis Date Noted  . Nonhealing skin ulcer (HCC) 10/28/2014  . PAD (peripheral artery disease) (HCC) 10/28/2014  . Bilateral carotid artery disease (HCC) 07/08/2014  . Critical lower limb ischemia 02/28/2014  . Obstructive sleep apnea 02/12/2014  . Aftercare following surgery of the circulatory system, [place] 12/04/2013  . Carotid stenosis 11/29/2013  . Restless leg syndrome 10/23/2013  . Sleep apnea 10/23/2013  . Obesity (BMI 30.0-34.9) 10/23/2013  . Hyperlipidemia 07/16/2013  . Peripheral arterial disease (HCC) 07/16/2013  . Occlusion and stenosis of carotid artery without mention of cerebral infarction 04/24/2012  . History of stroke June 2013 03/14/2012  .  Hypertension 03/14/2012  . Type 2 diabetes mellitus with circulatory disorder (HCC) 03/14/2012  . Peripheral neuropathy (HCC) 03/14/2012  . Cardiomyopathy- EF NL 5/14 03/14/2012  . ARTHRITIS, RIGHT FOOT 06/26/2008  Cynthia Russell, PT CLT [phone] 08/12/2015, 8:53 AM  [place] [facility] [address] [place], [place], [address] Phone: [phone]   Fax:  [phone]  Name: [name] MRN: [id number] Date of Birth: 12/15/1945      

## 2015-08-19 ENCOUNTER — Ambulatory Visit (HOSPITAL_COMMUNITY): Payer: PPO | Admitting: Physical Therapy

## 2015-08-19 DIAGNOSIS — T148XXA Other injury of unspecified body region, initial encounter: Secondary | ICD-10-CM

## 2015-08-19 DIAGNOSIS — M6289 Other specified disorders of muscle: Secondary | ICD-10-CM

## 2015-08-19 NOTE — Therapy (Signed)
[place], [place], [address] Phone: [phone]   Fax:  [phone]  Physical Therapy Treatment  Patient Details  Name: [name] MRN: [id number] Date of Birth: November 28, 1945 No Data Recorded  Encounter Date: 08/19/2015      PT End of Session - 08/19/15 0917    Visit Number 60   Number of Visits 76   Date for PT Re-Evaluation 08/26/15   Authorization Type medicare   Authorization Time Period Gcode done on 57 th visit   Authorization - Visit Number 48   Authorization - Number of Visits 87   PT Start Time 0805   PT Stop Time 0845   PT Time Calculation (min) 40 min   Activity Tolerance Patient tolerated treatment well      Past Medical History  Diagnosis Date  . Hypertension   . Diabetes mellitus   . Carotid artery occlusion   . Stroke [facility]) March 08, 2012  . Hyperlipidemia   . Peripheral arterial disease (HCC)     nonhealing ulcers bilaterally on each great toe  . PONV (postoperative nausea and vomiting)   . Arthritis   . Hypothyroidism   . Shortness of breath   . Obstructive sleep apnea     Past Surgical History  Procedure Laterality Date  . Back surgery    . Cervical fusion    . Foot surgery    . Spine surgery    . Eye surgery    . Endarterectomy Left 11/29/2013    Procedure: ENDARTERECTOMY CAROTID;  Surgeon: Serafina Mitchell, MD;  Location: [facility] OR;  Service: Vascular;  Laterality: Left;  . Carotid endarterectomy Left   . Pv angiogram  02/18/2014    tibial vessel diseas bil.  . Angioplasty  02/28/14    diamond back orbital rotational atherectomy of Rt. tibial  . Lower ext duplex doppler  03/14/14    Rt ABI 1.2  . Lower extremity angiogram Bilateral 02/18/2014    Procedure: LOWER EXTREMITY ANGIOGRAM;  Surgeon: [name], MD;  Location: [facility] CATH LAB;  Service: Cardiovascular;  Laterality: Bilateral;  . Lower extremity angiogram N/A 10/31/2014    Procedure: LOWER EXTREMITY ANGIOGRAM;   Surgeon: [name], MD;  Location: [facility] CATH LAB;  Service: Cardiovascular;  Laterality: N/A;    There were no vitals filed for this visit.  Visit Diagnosis:  Nonhealing nonsurgical wound with necrosis of muscle                     Wound Therapy - 08/19/15 0913    Subjective Pt states he has no pain    Patient and Family Stated Goals wounds to heal    Prior Treatments podiatrists self care.   Pain Assessment No/denies pain   Pressure Ulcer Properties Date First Assessed: 02/25/15 Location: Toe (Comment  which one) Location Orientation: Left Staging: Stage III -  Full thickness tissue loss. Subcutaneous fat may be visible but bone, tendon or muscle are NOT exposed.   Wound Properties Date First Assessed: 03/14/14 Time First Assessed: 1930 Wound Type: Diabetic ulcer Location: Other (Comment) , r big toe  Location Orientation: Right Wound Description (Comments): nonhealing wound on planar aspct of MTP of great toe  Present on Admission: Yes   Dressing Type Gauze (Comment)  honey into wound bed   Dressing Changed Changed   Dressing Status Old drainage   Dressing Change Frequency Daily   Site / Wound Assessment Red   %  Wound base Red or Granulating 100%   % Wound base Yellow 0%   Peri-wound Assessment Other (Comment)  calloused halo surrounds wound of .8 cm    Undermining (cm) no longer undermining as all callous removed today.   Margins Other (Comment)   Drainage Amount Scant   Drainage Description Serous   Treatment Cleansed;Debridement (Selective)   Selective Debridement - Location callous  epiboled edges and callous perimeter   Selective Debridement - Tools Used Forceps;Scalpel;Scissors   Selective Debridement - Tissue Removed callous and dry skin   Wound Therapy - Clinical Statement Removed remaining callous exposing undermined tissue.  Pt without c/o pain or distress during treatment.  Overall better with decreasing callous and increased approximation.  Pt is  now using SPC to ambulate full time.    Wound Therapy - Functional Problem List Pt continues to state that it is easier for him to ambulate.    Factors Delaying/Impairing Wound Healing Altered sensation;Diabetes Mellitus;Infection - systemic/local;Immobility;Multiple medical problems;Polypharmacy   Hydrotherapy Plan Debridement;Dressing change;Patient/family education   Wound Therapy - Current Recommendations PT;Other (comment)   Wound Therapy - Follow Up Recommendations Other (comment)  return to podiatrist.    Wound Plan continue to see pt  one time a week until pt is able to see his podiatrist to ensure that foot stays infection free.    Dressing  vaseline to the periphery with acticoat silver placed into wound bed followed by 4x4 and wrapped with 3" kling    Decrease Necrotic Tissue to STG: 4 weeks: 0%   Decrease Necrotic Tissue - Progress Progressing toward goal   Increase Granulation Tissue to STG: 4 weeks 100%   Increase Granulation Tissue - Progress Progressing toward goal   Decrease Length/Width/Depth by (cm) STG: decrease Lt to have no tunnelling ; Rt to be decresed to 1.0x .75 x .1 STG for 4 weeks;  LTG healed 8 weeks   Decrease Length/Width/Depth - Progress Progressing toward goal   Additional Wound Therapy Goal Pt to state it is easier to walk 8 weeks    Additional Wound Therapy Goal - Progress Met                              Problem List Patient Active Problem List   Diagnosis Date Noted  . Nonhealing skin ulcer ([place]) 10/28/2014  . PAD (peripheral artery disease) ([place]) 10/28/2014  . Bilateral carotid artery disease ([place]) 07/08/2014  . Critical lower limb ischemia 02/28/2014  . Obstructive sleep apnea 02/12/2014  . Aftercare following surgery of the circulatory system, [place] 12/04/2013  . Carotid stenosis 11/29/2013  . Restless leg syndrome 10/23/2013  . Sleep apnea 10/23/2013  . Obesity (BMI 30.0-34.9) 10/23/2013  . Hyperlipidemia 07/16/2013  .  Peripheral arterial disease ([place]) 07/16/2013  . Occlusion and stenosis of carotid artery without mention of cerebral infarction 04/24/2012  . History of stroke June 2013 03/14/2012  . Hypertension 03/14/2012  . Type 2 diabetes mellitus with circulatory disorder ([place]) 03/14/2012  . Peripheral neuropathy ([place]) 03/14/2012  . Cardiomyopathy- EF NL 5/14 03/14/2012  . ARTHRITIS, RIGHT FOOT 06/26/2008    Teena Irani, PTA/CLT [phone] 08/19/2015, 9:19 AM  [place] [address] [place], [place], [address] Phone: [phone]   Fax:  [phone]  Name: [name] MRN: [id number] Date of Birth: April 06, 1946

## 2015-08-26 ENCOUNTER — Ambulatory Visit (HOSPITAL_COMMUNITY): Payer: PPO | Attending: Podiatry | Admitting: Physical Therapy

## 2015-08-26 DIAGNOSIS — T148 Other injury of unspecified body region: Secondary | ICD-10-CM | POA: Diagnosis present

## 2015-08-26 DIAGNOSIS — T148XXA Other injury of unspecified body region, initial encounter: Secondary | ICD-10-CM

## 2015-08-26 DIAGNOSIS — M6289 Other specified disorders of muscle: Secondary | ICD-10-CM | POA: Diagnosis not present

## 2015-08-26 NOTE — Therapy (Signed)
[place], [place], [address] Phone: [phone]   Fax:  [phone]  Wound Care Therapy  Patient Details  Name: [name] MRN: [id number] Date of Birth: [birth year] No Data Recorded  Encounter Date: 08/26/2015      PT End of Session - 08/26/15 1728    Visit Number 102   Number of Visits 57   Date for PT Re-Evaluation 08/26/15   Authorization Type medicare   Authorization Time Period Gcode done on 57 th visit   Authorization - Visit Number 61   Authorization - Number of Visits 65   PT Start Time 1300   PT Stop Time 1340   PT Time Calculation (min) 40 min   Activity Tolerance Patient tolerated treatment well   Behavior During Therapy [facility] for tasks assessed/performed      Past Medical History  Diagnosis Date  . Hypertension   . Diabetes mellitus   . Carotid artery occlusion   . Stroke [facility]) March 08, 2012  . Hyperlipidemia   . Peripheral arterial disease (HCC)     nonhealing ulcers bilaterally on each great toe  . PONV (postoperative nausea and vomiting)   . Arthritis   . Hypothyroidism   . Shortness of breath   . Obstructive sleep apnea     Past Surgical History  Procedure Laterality Date  . Back surgery    . Cervical fusion    . Foot surgery    . Spine surgery    . Eye surgery    . Endarterectomy Left 11/29/2013    Procedure: ENDARTERECTOMY CAROTID;  Surgeon: Serafina Mitchell, MD;  Location: [facility] OR;  Service: Vascular;  Laterality: Left;  . Carotid endarterectomy Left   . Pv angiogram  02/18/2014    tibial vessel diseas bil.  . Angioplasty  02/28/14    diamond back orbital rotational atherectomy of Rt. tibial  . Lower ext duplex doppler  03/14/14    Rt ABI 1.2  . Lower extremity angiogram Bilateral 02/18/2014    Procedure: LOWER EXTREMITY ANGIOGRAM;  Surgeon: [name], MD;  Location: [facility] CATH LAB;  Service: Cardiovascular;  Laterality: Bilateral;  . Lower extremity angiogram N/A  10/31/2014    Procedure: LOWER EXTREMITY ANGIOGRAM;  Surgeon: [name], MD;  Location: [facility] CATH LAB;  Service: Cardiovascular;  Laterality: N/A;    There were no vitals filed for this visit.  Visit Diagnosis:  Nonhealing nonsurgical wound with necrosis of muscle                 Wound Therapy - 08/26/15 1725    Subjective Pt reports he is going to ask the podiatrist to work on his shoes more to relieve pressure and see if there is anything else to be done to help heal it.     Patient and Family Stated Goals wounds to heal    Prior Treatments podiatrists self care.   Pain Assessment No/denies pain   Pressure Ulcer Properties Date First Assessed: 02/25/15 Location: Toe (Comment  which one) Location Orientation: Left Staging: Stage III -  Full thickness tissue loss. Subcutaneous fat may be visible but bone, tendon or muscle are NOT exposed.   Wound Properties Date First Assessed: 03/14/14 Time First Assessed: 1930 Wound Type: Diabetic ulcer Location: Other (Comment) , r big toe  Location Orientation: Right Wound Description (Comments): nonhealing wound on planar aspct of MTP of great toe  Present on Admission: Yes   Dressing Type  Gauze (Comment);Silver dressings   Dressing Changed Changed   Dressing Status Old drainage   Dressing Change Frequency Daily   Site / Wound Assessment Red   % Wound base Red or Granulating 100%   % Wound base Yellow 0%   Peri-wound Assessment Other (Comment)  calloused halo surrounds wound of .8 cm    Margins Other (Comment)   Drainage Amount Scant   Drainage Description Serous   Treatment Cleansed;Debridement (Selective)   Selective Debridement - Location callous  epiboled edges and callous perimeter   Selective Debridement - Tools Used Forceps;Scalpel;Scissors   Selective Debridement - Tissue Removed callous and dry skin   Wound Therapy - Clinical Statement Continued removal of callous perimeter of wound preventing approximation.  Changed  dressing to acticoat silver dressing to help with moisture balance as getting too moist with honey.  Pt reported comfort with dressing overall.    Wound Therapy - Functional Problem List Pt continues to state that it is easier for him to ambulate.    Factors Delaying/Impairing Wound Healing Altered sensation;Diabetes Mellitus;Infection - systemic/local;Immobility;Multiple medical problems;Polypharmacy   Hydrotherapy Plan Debridement;Dressing change;Patient/family education   Wound Therapy - Current Recommendations PT;Other (comment)   Wound Therapy - Follow Up Recommendations Other (comment)  return to podiatrist.    Wound Plan continue to see pt  one time a week until pt is able to see his podiatrist to ensure that foot stays infection free.    Dressing  vaseline to the periphery with acticoat silver placed into wound bed followed by 4x4 and wrapped with 3" kling    Decrease Necrotic Tissue to STG: 4 weeks: 0%   Increase Granulation Tissue to STG: 4 weeks 100%   Decrease Length/Width/Depth by (cm) STG: decrease Lt to have no tunnelling ; Rt to be decresed to 1.0x .75 x .1 STG for 4 weeks;  LTG healed 8 weeks   Additional Wound Therapy Goal Pt to state it is easier to walk 8 weeks            Problem List Patient Active Problem List   Diagnosis Date Noted  . Nonhealing skin ulcer ([place]) 10/28/2014  . PAD (peripheral artery disease) ([place]) 10/28/2014  . Bilateral carotid artery disease ([place]) 07/08/2014  . Critical lower limb ischemia 02/28/2014  . Obstructive sleep apnea 02/12/2014  . Aftercare following surgery of the circulatory system, [place] 12/04/2013  . Carotid stenosis 11/29/2013  . Restless leg syndrome 10/23/2013  . Sleep apnea 10/23/2013  . Obesity (BMI 30.0-34.9) 10/23/2013  . Hyperlipidemia 07/16/2013  . Peripheral arterial disease ([place]) 07/16/2013  . Occlusion and stenosis of carotid artery without mention of cerebral infarction 04/24/2012  . History of stroke June 2013  03/14/2012  . Hypertension 03/14/2012  . Type 2 diabetes mellitus with circulatory disorder ([place]) 03/14/2012  . Peripheral neuropathy ([place]) 03/14/2012  . Cardiomyopathy- EF NL 5/14 03/14/2012  . ARTHRITIS, RIGHT FOOT 06/26/2008    Teena Irani, PTA/CLT [phone] 08/26/2015, 5:30 PM  [place], [place], [address] Phone: [phone]   Fax:  [phone]  Name: [name] MRN: [id number] Date of Birth: [birth year]

## 2015-08-27 ENCOUNTER — Encounter: Payer: Self-pay | Admitting: Podiatry

## 2015-08-27 ENCOUNTER — Ambulatory Visit (INDEPENDENT_AMBULATORY_CARE_PROVIDER_SITE_OTHER): Payer: PPO | Admitting: Podiatry

## 2015-08-27 DIAGNOSIS — M79676 Pain in unspecified toe(s): Secondary | ICD-10-CM | POA: Diagnosis not present

## 2015-08-27 DIAGNOSIS — L84 Corns and callosities: Secondary | ICD-10-CM | POA: Diagnosis not present

## 2015-08-27 DIAGNOSIS — B351 Tinea unguium: Secondary | ICD-10-CM | POA: Diagnosis not present

## 2015-08-27 DIAGNOSIS — E1151 Type 2 diabetes mellitus with diabetic peripheral angiopathy without gangrene: Secondary | ICD-10-CM

## 2015-08-27 NOTE — Patient Instructions (Signed)
I would recommend for the chronic wound on the bottom of the right foot a Cam Walker like boot to be worn at all times except when driving and sleeping. Also, after wearing Cam Walker boot if the wound still would not close with local wound care consider referral for hyperbaric therapy  Diabetes and Foot Care Diabetes may cause you to have problems because of poor blood supply (circulation) to your feet and legs. This may cause the skin on your feet to become thinner, break easier, and heal more slowly. Your skin may become dry, and the skin may peel and crack. You may also have nerve damage in your legs and feet causing decreased feeling in them. You may not notice minor injuries to your feet that could lead to infections or more serious problems. Taking care of your feet is one of the most important things you can do for yourself.  HOME CARE INSTRUCTIONS  Wear shoes at all times, even in the house. Do not go barefoot. Bare feet are easily injured.  Check your feet daily for blisters, cuts, and redness. If you cannot see the bottom of your feet, use a mirror or ask someone for help.  Wash your feet with warm water (do not use hot water) and mild soap. Then pat your feet and the areas between your toes until they are completely dry. Do not soak your feet as this can dry your skin.  Apply a moisturizing lotion or petroleum jelly (that does not contain alcohol and is unscented) to the skin on your feet and to dry, brittle toenails. Do not apply lotion between your toes.  Trim your toenails straight across. Do not dig under them or around the cuticle. File the edges of your nails with an emery board or nail file.  Do not cut corns or calluses or try to remove them with medicine.  Wear clean socks or stockings every day. Make sure they are not too tight. Do not wear knee-high stockings since they may decrease blood flow to your legs.  Wear shoes that fit properly and have enough cushioning. To  break in new shoes, wear them for just a few hours a day. This prevents you from injuring your feet. Always look in your shoes before you put them on to be sure there are no objects inside.  Do not cross your legs. This may decrease the blood flow to your feet.  If you find a minor scrape, cut, or break in the skin on your feet, keep it and the skin around it clean and dry. These areas may be cleansed with mild soap and water. Do not cleanse the area with peroxide, alcohol, or iodine.  When you remove an adhesive bandage, be sure not to damage the skin around it.  If you have a wound, look at it several times a day to make sure it is healing.  Do not use heating pads or hot water bottles. They may burn your skin. If you have lost feeling in your feet or legs, you may not know it is happening until it is too late.  Make sure your health care provider performs a complete foot exam at least annually or more often if you have foot problems. Report any cuts, sores, or bruises to your health care provider immediately. SEEK MEDICAL CARE IF:   You have an injury that is not healing.  You have cuts or breaks in the skin.  You have an ingrown nail.  You  notice redness on your legs or feet.  You feel burning or tingling in your legs or feet.  You have pain or cramps in your legs and feet.  Your legs or feet are numb.  Your feet always feel cold. SEEK IMMEDIATE MEDICAL CARE IF:   There is increasing redness, swelling, or pain in or around a wound.  There is a red line that goes up your leg.  Pus is coming from a wound.  You develop a fever or as directed by your health care provider.  You notice a bad smell coming from an ulcer or wound.   This information is not intended to replace advice given to you by your health care provider. Make sure you discuss any questions you have with your health care provider.   Document Released: 09/03/2000 Document Revised: 05/09/2013 Document  Reviewed: 02/13/2013 Elsevier Interactive Patient Education [employer].

## 2015-08-28 NOTE — Progress Notes (Signed)
Patient ID: [name], male   DOB: 1946-04-10, 69 y.o.   MRN: [id number]   Subjective: This patient presents again for a scheduled visit complaining of thick long toenails which are uncomfortable walking wearing shoes and requests nail debridement. Also patient complaining of painful corn on the lateral third right toe requesting debridement of this lesion. Patient currently under care of wound care center for diabetic ulcer on the right foot and wound care was asking patient to ask my opinion of any further treatment for this lesion. The wound has reduced somewhat in size, however, is open without clinical sign of infection. Patient walks in diabetic shoe with custom insole and will wanted to know if a cut out in the plantar aspect of the insole and right foot would be adequate to reduce pressure in this area  Patient is a known diabetic with peripheral arterial disease  Objective: Orientated 3  Plantar right first MPJ has 10 mm wound with no surrounding erythema, edema, warmth, drainage or malodor. The wound is packed with a silver-like dressing and was left intact the wound was 15 mm in diameter on the visit of 06/18/2015  The toenails are elongated, brittle, discolored, deforms 6-10 Bleeding keratoses lateral third right toe that remains closed after debridement  Assessment: Diabetic with peripheral arterial disease Clinically not infected skin ulcer plantar aspect of the right foot Symptomatic onychomycoses 6-10 Pre-ulcerative callus lateral third right toe  Plan: Today I recommended to the patient that a Cam Walker boot to offload the plantar acid right foot would have a greater chance of offloading this area then a cut out shoe. I suggested he talk to the wound care center about this option Reapplied gauze dressing to the plantar ulcer right  Toenails 6-10 were debrided mechanically electronically without a bleeding Debride pre-ulcerative callus and lateral third right toe  without a bleeding Maintain silicone pad around third right toe  Reappoint 3 months

## 2015-09-02 ENCOUNTER — Ambulatory Visit (HOSPITAL_COMMUNITY): Payer: PPO | Admitting: Physical Therapy

## 2015-09-02 DIAGNOSIS — M6289 Other specified disorders of muscle: Secondary | ICD-10-CM | POA: Diagnosis not present

## 2015-09-02 DIAGNOSIS — T148XXA Other injury of unspecified body region, initial encounter: Secondary | ICD-10-CM

## 2015-09-02 NOTE — Therapy (Signed)
[place], [place], [address] Phone: [phone]   Fax:  [phone]  Wound Care Therapy  Patient Details  Name: [name] MRN: [id number] Date of Birth: 19-Nov-1945 No Data Recorded  Encounter Date: 09/02/2015      PT End of Session - 09/02/15 0933    Visit Number 22   Number of Visits 43   Date for PT Re-Evaluation 08/26/15   Authorization Type medicare   Authorization Time Period Gcode done on 57 th visit   Authorization - Visit Number 62   Authorization - Number of Visits 15   PT Start Time 0855   PT Stop Time 0925   PT Time Calculation (min) 30 min   Activity Tolerance Patient tolerated treatment well   Behavior During Therapy [facility] for tasks assessed/performed      Past Medical History  Diagnosis Date  . Hypertension   . Diabetes mellitus   . Carotid artery occlusion   . Stroke [facility]) March 08, 2012  . Hyperlipidemia   . Peripheral arterial disease (HCC)     nonhealing ulcers bilaterally on each great toe  . PONV (postoperative nausea and vomiting)   . Arthritis   . Hypothyroidism   . Shortness of breath   . Obstructive sleep apnea     Past Surgical History  Procedure Laterality Date  . Back surgery    . Cervical fusion    . Foot surgery    . Spine surgery    . Eye surgery    . Endarterectomy Left 11/29/2013    Procedure: ENDARTERECTOMY CAROTID;  Surgeon: Serafina Mitchell, MD;  Location: [facility] OR;  Service: Vascular;  Laterality: Left;  . Carotid endarterectomy Left   . Pv angiogram  02/18/2014    tibial vessel diseas bil.  . Angioplasty  02/28/14    diamond back orbital rotational atherectomy of Rt. tibial  . Lower ext duplex doppler  03/14/14    Rt ABI 1.2  . Lower extremity angiogram Bilateral 02/18/2014    Procedure: LOWER EXTREMITY ANGIOGRAM;  Surgeon: [name], MD;  Location: [facility] CATH LAB;  Service: Cardiovascular;  Laterality: Bilateral;  . Lower extremity angiogram N/A  10/31/2014    Procedure: LOWER EXTREMITY ANGIOGRAM;  Surgeon: [name], MD;  Location: [facility] CATH LAB;  Service: Cardiovascular;  Laterality: N/A;    There were no vitals filed for this visit.  Visit Diagnosis:  Nonhealing nonsurgical wound with necrosis of muscle                 Wound Therapy - 09/02/15 0930    Subjective Pt states the podiatrist wants him wearing a CAM boot.  STates he has one but cant wear it to work or drive in it.     Patient and Family Stated Goals wounds to heal    Prior Treatments podiatrists self care.   Pain Assessment No/denies pain   Pressure Ulcer Properties Date First Assessed: 02/25/15 Location: Toe (Comment  which one) Location Orientation: Left Staging: Stage III -  Full thickness tissue loss. Subcutaneous fat may be visible but bone, tendon or muscle are NOT exposed.   Wound Properties Date First Assessed: 03/14/14 Time First Assessed: 1930 Wound Type: Diabetic ulcer Location: Other (Comment) , r big toe  Location Orientation: Right Wound Description (Comments): nonhealing wound on planar aspct of MTP of great toe  Present on Admission: Yes   Dressing Type Gauze (Comment);Silver dressings   Dressing Changed  Changed   Dressing Status Old drainage   Dressing Change Frequency Daily   Site / Wound Assessment Red   % Wound base Red or Granulating 100%   % Wound base Yellow 0%   Peri-wound Assessment Other (Comment)  calloused halo surrounds wound of .8 cm    Margins Other (Comment)   Drainage Amount Scant   Drainage Description Serous   Treatment Cleansed;Debridement (Selective)   Selective Debridement - Location callous  epiboled edges and callous perimeter   Selective Debridement - Tools Used Forceps;Scalpel;Scissors   Selective Debridement - Tissue Removed callous and dry skin   Wound Therapy - Clinical Statement Explained to patient his would would never heal unless he does as MD instructs and wears the CAM boot to decrease weight  bearing through his foot.  Pt verbalized understanding but reported he was not allowed to wear the boot at work.  Able to remove more callous from perimeter of wound, however remains thick and preventing approximation.     Wound Therapy - Functional Problem List Pt continues to state that it is easier for him to ambulate.    Factors Delaying/Impairing Wound Healing Altered sensation;Diabetes Mellitus;Infection - systemic/local;Immobility;Multiple medical problems;Polypharmacy   Hydrotherapy Plan Debridement;Dressing change;Patient/family education   Wound Therapy - Current Recommendations PT;Other (comment)   Wound Therapy - Follow Up Recommendations Other (comment)  return to podiatrist.    Wound Plan continue to see pt  one time a week until pt is able to see his podiatrist to ensure that foot stays infection free.    Dressing  vaseline to the periphery with acticoat silver placed into wound bed followed by 4x4 and wrapped with 3" kling    Decrease Necrotic Tissue to STG: 4 weeks: 0%   Increase Granulation Tissue to STG: 4 weeks 100%   Decrease Length/Width/Depth by (cm) STG: decrease Lt to have no tunnelling ; Rt to be decresed to 1.0x .75 x .1 STG for 4 weeks;  LTG healed 8 weeks   Additional Wound Therapy Goal Pt to state it is easier to walk 8 weeks                               Problem List Patient Active Problem List   Diagnosis Date Noted  . Nonhealing skin ulcer ([place]) 10/28/2014  . PAD (peripheral artery disease) ([place]) 10/28/2014  . Bilateral carotid artery disease ([place]) 07/08/2014  . Critical lower limb ischemia 02/28/2014  . Obstructive sleep apnea 02/12/2014  . Aftercare following surgery of the circulatory system, [place] 12/04/2013  . Carotid stenosis 11/29/2013  . Restless leg syndrome 10/23/2013  . Sleep apnea 10/23/2013  . Obesity (BMI 30.0-34.9) 10/23/2013  . Hyperlipidemia 07/16/2013  . Peripheral arterial disease ([place]) 07/16/2013  . Occlusion  and stenosis of carotid artery without mention of cerebral infarction 04/24/2012  . History of stroke June 2013 03/14/2012  . Hypertension 03/14/2012  . Type 2 diabetes mellitus with circulatory disorder ([place]) 03/14/2012  . Peripheral neuropathy ([place]) 03/14/2012  . Cardiomyopathy- EF NL 5/14 03/14/2012  . ARTHRITIS, RIGHT FOOT 06/26/2008    Teena Irani, PTA/CLT [phone]  09/02/2015, 9:34 AM  [facility] [address] [place], [place], [address] Phone: [phone]   Fax:  [phone]  Name: [name] MRN: [id number] Date of Birth: 22-Jan-1946

## 2015-09-09 ENCOUNTER — Ambulatory Visit (HOSPITAL_COMMUNITY): Payer: PPO | Admitting: Physical Therapy

## 2015-09-09 DIAGNOSIS — M6289 Other specified disorders of muscle: Secondary | ICD-10-CM | POA: Diagnosis not present

## 2015-09-09 DIAGNOSIS — T148XXA Other injury of unspecified body region, initial encounter: Secondary | ICD-10-CM

## 2015-09-09 NOTE — Therapy (Signed)
[place], [place], [address] Phone: [phone]   Fax:  [phone]  Wound Care Therapy  Patient Details  Name: [name] MRN: [id number] Date of Birth: 1946-06-07 No Data Recorded  Encounter Date: 09/09/2015      PT End of Session - 09/09/15 0955    Visit Number 50   Number of Visits 12   Date for PT Re-Evaluation 08/26/15   Authorization Type medicare   Authorization Time Period Gcode done on 57 th visit   Authorization - Visit Number 63   Authorization - Number of Visits 98   PT Start Time [phone]   PT Stop Time 0920   PT Time Calculation (min) 28 min   Activity Tolerance Patient tolerated treatment well   Behavior During Therapy [facility] for tasks assessed/performed      Past Medical History  Diagnosis Date  . Hypertension   . Diabetes mellitus   . Carotid artery occlusion   . Stroke [facility]) March 08, 2012  . Hyperlipidemia   . Peripheral arterial disease (HCC)     nonhealing ulcers bilaterally on each great toe  . PONV (postoperative nausea and vomiting)   . Arthritis   . Hypothyroidism   . Shortness of breath   . Obstructive sleep apnea     Past Surgical History  Procedure Laterality Date  . Back surgery    . Cervical fusion    . Foot surgery    . Spine surgery    . Eye surgery    . Endarterectomy Left 11/29/2013    Procedure: ENDARTERECTOMY CAROTID;  Surgeon: Serafina Mitchell, MD;  Location: [facility] OR;  Service: Vascular;  Laterality: Left;  . Carotid endarterectomy Left   . Pv angiogram  02/18/2014    tibial vessel diseas bil.  . Angioplasty  02/28/14    diamond back orbital rotational atherectomy of Rt. tibial  . Lower ext duplex doppler  03/14/14    Rt ABI 1.2  . Lower extremity angiogram Bilateral 02/18/2014    Procedure: LOWER EXTREMITY ANGIOGRAM;  Surgeon: [name], MD;  Location: [facility] CATH LAB;  Service: Cardiovascular;  Laterality: Bilateral;  . Lower extremity angiogram N/A  10/31/2014    Procedure: LOWER EXTREMITY ANGIOGRAM;  Surgeon: [name], MD;  Location: [facility] CATH LAB;  Service: Cardiovascular;  Laterality: N/A;    There were no vitals filed for this visit.  Visit Diagnosis:  Nonhealing nonsurgical wound with necrosis of muscle                 Wound Therapy - 09/09/15 0953    Subjective Pt states he has a job interview today.  Hoping to get a job where he can wear his boot.   Patient and Family Stated Goals wounds to heal    Prior Treatments podiatrists self care.   Pain Assessment No/denies pain   Pressure Ulcer Properties Date First Assessed: 02/25/15 Location: Toe (Comment  which one) Location Orientation: Left Staging: Stage III -  Full thickness tissue loss. Subcutaneous fat may be visible but bone, tendon or muscle are NOT exposed.   Wound Properties Date First Assessed: 03/14/14 Time First Assessed: 1930 Wound Type: Diabetic ulcer Location: Other (Comment) , r big toe  Location Orientation: Right Wound Description (Comments): nonhealing wound on planar aspct of MTP of great toe  Present on Admission: Yes   Dressing Type Gauze (Comment);Silver dressings   Dressing Status Old drainage   Dressing Change Frequency  Daily   Site / Wound Assessment Red   % Wound base Red or Granulating 100%   % Wound base Yellow 0%   Peri-wound Assessment Other (Comment)  calloused halo surrounds wound of .8 cm    Wound Length (cm) 1.2 cm   Wound Width (cm) 1.5 cm   Wound Depth (cm) 0.5 cm   Undermining (cm) 9-3:00 0.5cm undermining   Margins Other (Comment)   Drainage Amount Scant   Drainage Description Serous   Treatment Cleansed;Debridement (Selective)   Selective Debridement - Location callous  epiboled edges and callous perimeter   Selective Debridement - Tools Used Forceps;Scalpel;Scissors   Selective Debridement - Tissue Removed callous and dry skin   Wound Therapy - Clinical Statement Wound remeasured today with increase in size and  return of undermining to superior border. Again, Explained to patient his would would never heal unless he does as MD instructs and wears the CAM boot to decrease weight bearing through his foot.  Pt verbalized understanding but reported he was not allowed to wear the boot at work. He is currently seeking employment where this would be allowed.  Continued removal of callous from perimeter of wound, however remains thick and preventing approximation.     Wound Therapy - Functional Problem List Pt continues to state that it is easier for him to ambulate.    Factors Delaying/Impairing Wound Healing Altered sensation;Diabetes Mellitus;Infection - systemic/local;Immobility;Multiple medical problems;Polypharmacy   Hydrotherapy Plan Debridement;Dressing change;Patient/family education   Wound Therapy - Current Recommendations PT;Other (comment)   Wound Therapy - Follow Up Recommendations Other (comment)  return to podiatrist.    Wound Plan continue to see pt  one time a week until pt is able to see his podiatrist to ensure that foot stays infection free.  Re-eval X 2 more weeks.    Dressing  vaseline to the periphery with acticoat silver placed into wound bed followed by 4x4 and wrapped with 3" kling    Decrease Necrotic Tissue to STG: 4 weeks: 0%   Increase Granulation Tissue to STG: 4 weeks 100%   Decrease Length/Width/Depth by (cm) STG: decrease Lt to have no tunnelling ; Rt to be decresed to 1.0x .75 x .1 STG for 4 weeks;  LTG healed 8 weeks   Additional Wound Therapy Goal Pt to state it is easier to walk 8 weeks                               Problem List Patient Active Problem List   Diagnosis Date Noted  . Nonhealing skin ulcer ([place]) 10/28/2014  . PAD (peripheral artery disease) ([place]) 10/28/2014  . Bilateral carotid artery disease ([place]) 07/08/2014  . Critical lower limb ischemia 02/28/2014  . Obstructive sleep apnea 02/12/2014  . Aftercare following surgery of the  circulatory system, [place] 12/04/2013  . Carotid stenosis 11/29/2013  . Restless leg syndrome 10/23/2013  . Sleep apnea 10/23/2013  . Obesity (BMI 30.0-34.9) 10/23/2013  . Hyperlipidemia 07/16/2013  . Peripheral arterial disease ([place]) 07/16/2013  . Occlusion and stenosis of carotid artery without mention of cerebral infarction 04/24/2012  . History of stroke June 2013 03/14/2012  . Hypertension 03/14/2012  . Type 2 diabetes mellitus with circulatory disorder ([place]) 03/14/2012  . Peripheral neuropathy ([place]) 03/14/2012  . Cardiomyopathy- EF NL 5/14 03/14/2012  . ARTHRITIS, RIGHT FOOT 06/26/2008    Teena Irani, PTA/CLT [phone] 09/09/2015, 9:59 AM  [facility]  [address] [place], [place], [address] Phone: [phone]   Fax:  [phone]  Name: [name] MRN: [id number] Date of Birth: 1946/05/01

## 2015-09-16 ENCOUNTER — Telehealth: Payer: Self-pay | Admitting: *Deleted

## 2015-09-16 ENCOUNTER — Ambulatory Visit (HOSPITAL_COMMUNITY): Payer: PPO | Admitting: Physical Therapy

## 2015-09-16 DIAGNOSIS — M6289 Other specified disorders of muscle: Secondary | ICD-10-CM

## 2015-09-16 DIAGNOSIS — T148XXA Other injury of unspecified body region, initial encounter: Secondary | ICD-10-CM

## 2015-09-16 DIAGNOSIS — L97413 Non-pressure chronic ulcer of right heel and midfoot with necrosis of muscle: Secondary | ICD-10-CM

## 2015-09-16 NOTE — Therapy (Addendum)
[place], [place], [address] Phone: [phone]   Fax:  [phone]  Wound Care Therapy  Patient Details  Name: [name] MRN: [id number] Date of Birth: Feb 03, 1946 No Data Recorded  Encounter Date: 09/16/2015      PT End of Session - 09/16/15 0912    Visit Number 22   Number of Visits 27   Date for PT Re-Evaluation 08/26/15   Authorization Type medicare   Authorization - Visit Number 3   Authorization - Number of Visits 3   PT Start Time 0800   PT Stop Time 0840   PT Time Calculation (min) 40 min      Past Medical History  Diagnosis Date  . Hypertension   . Diabetes mellitus   . Carotid artery occlusion   . Stroke [facility]) March 08, 2012  . Hyperlipidemia   . Peripheral arterial disease (HCC)     nonhealing ulcers bilaterally on each great toe  . PONV (postoperative nausea and vomiting)   . Arthritis   . Hypothyroidism   . Shortness of breath   . Obstructive sleep apnea     Past Surgical History  Procedure Laterality Date  . Back surgery    . Cervical fusion    . Foot surgery    . Spine surgery    . Eye surgery    . Endarterectomy Left 11/29/2013    Procedure: ENDARTERECTOMY CAROTID;  Surgeon: Serafina Mitchell, MD;  Location: [facility] OR;  Service: Vascular;  Laterality: Left;  . Carotid endarterectomy Left   . Pv angiogram  02/18/2014    tibial vessel diseas bil.  . Angioplasty  02/28/14    diamond back orbital rotational atherectomy of Rt. tibial  . Lower ext duplex doppler  03/14/14    Rt ABI 1.2  . Lower extremity angiogram Bilateral 02/18/2014    Procedure: LOWER EXTREMITY ANGIOGRAM;  Surgeon: [name], MD;  Location: [facility] CATH LAB;  Service: Cardiovascular;  Laterality: Bilateral;  . Lower extremity angiogram N/A 10/31/2014    Procedure: LOWER EXTREMITY ANGIOGRAM;  Surgeon: [name], MD;  Location: [facility] CATH LAB;  Service: Cardiovascular;  Laterality: N/A;    There were no  vitals filed for this visit.  Visit Diagnosis:  Nonhealing nonsurgical wound with necrosis of muscle                 Wound Therapy - 09/16/15 0846    Subjective Pt states he had a job interview.   Hoping to get a job where he can wear his boot.   Patient and Family Stated Goals wounds to heal    Prior Treatments podiatrists self care.   Pain Assessment No/denies pain   Pressure Ulcer Properties Date First Assessed: 02/25/15 Location: Toe (Comment  which one) Location Orientation: Left Staging: Stage III -  Full thickness tissue loss. Subcutaneous fat may be visible but bone, tendon or muscle are NOT exposed.   Wound Properties Date First Assessed: 03/14/14 Time First Assessed: 1930 Wound Type: Diabetic ulcer Location: Other (Comment) , r big toe  Location Orientation: Right Wound Description (Comments): nonhealing wound on planar aspct of MTP of great toe  Present on Admission: Yes   Dressing Type Gauze (Comment);Silver dressings   Dressing Changed Changed  changed dressing to silver hydrofiber.   Dressing Status Old drainage   Dressing Change Frequency Daily   Site / Wound Assessment Red   % Wound base Red or Granulating 100%   %  Wound base Yellow 0%   Peri-wound Assessment Other (Comment)  calloused halo surrounds wound of .5 cm now after debridement; was .8 last week but debrided more of callous this visit.    Wound Length (cm) 1.5 cm was 1.2 last week    Wound Width (cm) 2 cm was 1.6    Wound Depth (cm) 0.5 cm   Tunneling (cm) .2 diameter in upper Rt quadrant unsure of depth.    Margins Other (Comment)   Drainage Amount Moderate   Drainage Description Serous   Treatment Cleansed;Debridement (Selective)   Selective Debridement - Location callous  epiboled edges and callous perimeter   Selective Debridement - Tools Used Forceps;Scalpel;Scissors   Selective Debridement - Tissue Removed callous and dry skin   Wound Therapy - Clinical Statement Pt wound is deteriorating.   explained to pt that therapist feels he may be better served at the wound center.  Therapist will contact referring MD as therapist feels pt would benefit from a biopsy as well as an MRI of his foot as this wound is no longer healing and is deteriorating.    Wound Therapy - Functional Problem List Pt continues to state that it is easier for him to ambulate.    Factors Delaying/Impairing Wound Healing Altered sensation;Diabetes Mellitus;Infection - systemic/local;Immobility;Multiple medical problems;Polypharmacy   Hydrotherapy Plan Debridement;Dressing change;Patient/family education   Wound Therapy - Current Recommendations Other (comment)  biopsy, MRI refer to wound center.   Wound Therapy - Follow Up Recommendations Other (comment)  return to podiatrist.    Wound Plan Discharge pt to follow up at wound center    Dressing  vaseline to the periphery with silver hydrofiber placed into wound bed followed by 4x4 and wrapped with 2" kling    Decrease Necrotic Tissue to STG: 4 weeks: 0%   Decrease Necrotic Tissue - Progress Not progressing   Increase Granulation Tissue to STG: 4 weeks 100%   Increase Granulation Tissue - Progress Not met   Decrease Length/Width/Depth by (cm) STG: decrease Lt to have no tunnelling ; Rt to be decresed to 1.0x .75 x .1 STG for 4 weeks;  LTG healed 8 weeks   Decrease Length/Width/Depth - Progress Not met   Additional Wound Therapy Goal Pt to state it is easier to walk 8 weeks    Additional Wound Therapy Goal - Progress Met                 PT Education - 2015-09-24 0910    Education provided Yes   Education Details Wound is deteriorating and will be discharged from physical therapy due to no progress and in fact deterioration for the past month.  Therapist recommends biopsy, MRI and wound center consult but a physician  must order all of the above.   Person(s) Educated Patient   Methods Explanation   Comprehension Verbalized understanding                       G-Codes - 09/24/15 0913    Functional Assessment Tool Used Based on skilled clinical assessment, wound dimensions, slow wound healing progress    Functional Limitation Other PT primary   Other PT Primary Goal Status (Z6109) At least 1 percent but less than 20 percent impaired, limited or restricted   Other PT Primary Discharge Status [phone]) At least 20 percent but less than 40 percent impaired, limited or restricted       Problem List Patient Active Problem List   Diagnosis Date Noted  .  Nonhealing skin ulcer ([place]) 10/28/2014  . PAD (peripheral artery disease) ([place]) 10/28/2014  . Bilateral carotid artery disease ([place]) 07/08/2014  . Critical lower limb ischemia 02/28/2014  . Obstructive sleep apnea 02/12/2014  . Aftercare following surgery of the circulatory system, [place] 12/04/2013  . Carotid stenosis 11/29/2013  . Restless leg syndrome 10/23/2013  . Sleep apnea 10/23/2013  . Obesity (BMI 30.0-34.9) 10/23/2013  . Hyperlipidemia 07/16/2013  . Peripheral arterial disease ([place]) 07/16/2013  . Occlusion and stenosis of carotid artery without mention of cerebral infarction 04/24/2012  . History of stroke June 2013 03/14/2012  . Hypertension 03/14/2012  . Type 2 diabetes mellitus with circulatory disorder ([place]) 03/14/2012  . Peripheral neuropathy ([place]) 03/14/2012  . Cardiomyopathy- EF NL 5/14 03/14/2012  . ARTHRITIS, RIGHT FOOT 06/26/2008   Rayetta Humphrey, PT CLT [phone] 09/16/2015, 9:14 AM  [place], [place], [address] Phone: [phone]   Fax:  [phone]  Name: [name] MRN: [id number] Date of Birth: 04-23-1946  PHYSICAL THERAPY DISCHARGE SUMMARY  Visits from Start of Care: 47  Current functional level related to goals / functional outcomes: Wound had been slowly improving until this month.  This month wound has been deteriorating    Remaining  deficits: Nonhealing wound   Education / Equipment: Need for further care.  Therapist recommendation:  Refer to wound care center at [facility], possible biopsy of wound bed and MRI of foot.   Plan: Patient agrees to discharge.  Patient goals were not met. Patient is being discharged due to lack of progress.  ?????       Rayetta Humphrey, [place] CLT [phone]

## 2015-09-16 NOTE — Telephone Encounter (Addendum)
Azucena Freed, Physical Therapist states pt has been treated for a non-healing ulcer since 10/2014, without improvement, recommends referral to [facility] for MRI, and wound biopsy.  I spoke with Azucena Freed, PT and pt informing both we would take over pt's care in between the referral to Wound Care center appt.  Transferred pt to schedulers. Faxed Wound treatment specifications to [place] with pt data.

## 2015-09-17 ENCOUNTER — Ambulatory Visit (HOSPITAL_COMMUNITY)
Admission: RE | Admit: 2015-09-17 | Discharge: 2015-09-17 | Disposition: A | Discharge status: Home / Self Care | Payer: PPO | Source: Ambulatory Visit | Attending: General Surgery | Admitting: General Surgery

## 2015-09-17 ENCOUNTER — Other Ambulatory Visit (HOSPITAL_COMMUNITY): Payer: Self-pay | Admitting: *Deleted

## 2015-09-17 ENCOUNTER — Encounter (HOSPITAL_BASED_OUTPATIENT_CLINIC_OR_DEPARTMENT_OTHER): Payer: PPO | Attending: General Surgery

## 2015-09-17 DIAGNOSIS — M1990 Unspecified osteoarthritis, unspecified site: Secondary | ICD-10-CM | POA: Insufficient documentation

## 2015-09-17 DIAGNOSIS — M86171 Other acute osteomyelitis, right ankle and foot: Secondary | ICD-10-CM

## 2015-09-17 DIAGNOSIS — M19071 Primary osteoarthritis, right ankle and foot: Secondary | ICD-10-CM | POA: Insufficient documentation

## 2015-09-17 DIAGNOSIS — L97511 Non-pressure chronic ulcer of other part of right foot limited to breakdown of skin: Secondary | ICD-10-CM | POA: Diagnosis not present

## 2015-09-17 DIAGNOSIS — G4730 Sleep apnea, unspecified: Secondary | ICD-10-CM | POA: Insufficient documentation

## 2015-09-17 DIAGNOSIS — E11621 Type 2 diabetes mellitus with foot ulcer: Secondary | ICD-10-CM | POA: Diagnosis present

## 2015-09-17 DIAGNOSIS — Z981 Arthrodesis status: Secondary | ICD-10-CM | POA: Diagnosis not present

## 2015-09-17 DIAGNOSIS — E1140 Type 2 diabetes mellitus with diabetic neuropathy, unspecified: Secondary | ICD-10-CM | POA: Diagnosis not present

## 2015-09-17 DIAGNOSIS — E1151 Type 2 diabetes mellitus with diabetic peripheral angiopathy without gangrene: Secondary | ICD-10-CM | POA: Diagnosis not present

## 2015-09-17 DIAGNOSIS — I10 Essential (primary) hypertension: Secondary | ICD-10-CM | POA: Diagnosis not present

## 2015-09-23 ENCOUNTER — Ambulatory Visit: Payer: PPO | Admitting: Podiatry

## 2015-09-24 ENCOUNTER — Encounter (HOSPITAL_BASED_OUTPATIENT_CLINIC_OR_DEPARTMENT_OTHER): Payer: PPO | Attending: Surgery

## 2015-09-24 DIAGNOSIS — I10 Essential (primary) hypertension: Secondary | ICD-10-CM | POA: Insufficient documentation

## 2015-09-24 DIAGNOSIS — I7389 Other specified peripheral vascular diseases: Secondary | ICD-10-CM | POA: Diagnosis not present

## 2015-09-24 DIAGNOSIS — Z8631 Personal history of diabetic foot ulcer: Secondary | ICD-10-CM | POA: Diagnosis not present

## 2015-09-24 DIAGNOSIS — G4730 Sleep apnea, unspecified: Secondary | ICD-10-CM | POA: Insufficient documentation

## 2015-09-24 DIAGNOSIS — E11621 Type 2 diabetes mellitus with foot ulcer: Secondary | ICD-10-CM | POA: Diagnosis not present

## 2015-09-24 DIAGNOSIS — E1140 Type 2 diabetes mellitus with diabetic neuropathy, unspecified: Secondary | ICD-10-CM | POA: Insufficient documentation

## 2015-09-24 DIAGNOSIS — E1151 Type 2 diabetes mellitus with diabetic peripheral angiopathy without gangrene: Secondary | ICD-10-CM | POA: Insufficient documentation

## 2015-09-24 DIAGNOSIS — L84 Corns and callosities: Secondary | ICD-10-CM | POA: Insufficient documentation

## 2015-09-24 DIAGNOSIS — E1142 Type 2 diabetes mellitus with diabetic polyneuropathy: Secondary | ICD-10-CM | POA: Diagnosis not present

## 2015-09-24 DIAGNOSIS — L97511 Non-pressure chronic ulcer of other part of right foot limited to breakdown of skin: Secondary | ICD-10-CM | POA: Insufficient documentation

## 2015-10-01 DIAGNOSIS — L97512 Non-pressure chronic ulcer of other part of right foot with fat layer exposed: Secondary | ICD-10-CM | POA: Diagnosis not present

## 2015-10-01 DIAGNOSIS — I7389 Other specified peripheral vascular diseases: Secondary | ICD-10-CM | POA: Diagnosis not present

## 2015-10-01 DIAGNOSIS — Z8631 Personal history of diabetic foot ulcer: Secondary | ICD-10-CM | POA: Diagnosis not present

## 2015-10-01 DIAGNOSIS — E11621 Type 2 diabetes mellitus with foot ulcer: Secondary | ICD-10-CM | POA: Diagnosis not present

## 2015-10-03 DIAGNOSIS — L97511 Non-pressure chronic ulcer of other part of right foot limited to breakdown of skin: Secondary | ICD-10-CM | POA: Diagnosis not present

## 2015-10-03 DIAGNOSIS — X35XXXA Volcanic eruption, initial encounter: Secondary | ICD-10-CM | POA: Diagnosis not present

## 2015-10-03 DIAGNOSIS — E11621 Type 2 diabetes mellitus with foot ulcer: Secondary | ICD-10-CM | POA: Diagnosis not present

## 2015-10-07 ENCOUNTER — Encounter (HOSPITAL_BASED_OUTPATIENT_CLINIC_OR_DEPARTMENT_OTHER): Payer: PPO

## 2015-10-08 DIAGNOSIS — L97511 Non-pressure chronic ulcer of other part of right foot limited to breakdown of skin: Secondary | ICD-10-CM | POA: Diagnosis not present

## 2015-10-08 DIAGNOSIS — I739 Peripheral vascular disease, unspecified: Secondary | ICD-10-CM | POA: Diagnosis not present

## 2015-10-08 DIAGNOSIS — Z8631 Personal history of diabetic foot ulcer: Secondary | ICD-10-CM | POA: Diagnosis not present

## 2015-10-08 DIAGNOSIS — E11621 Type 2 diabetes mellitus with foot ulcer: Secondary | ICD-10-CM | POA: Diagnosis not present

## 2015-10-15 DIAGNOSIS — X35XXXA Volcanic eruption, initial encounter: Secondary | ICD-10-CM | POA: Diagnosis not present

## 2015-10-15 DIAGNOSIS — E11621 Type 2 diabetes mellitus with foot ulcer: Secondary | ICD-10-CM | POA: Diagnosis not present

## 2015-10-15 DIAGNOSIS — L97511 Non-pressure chronic ulcer of other part of right foot limited to breakdown of skin: Secondary | ICD-10-CM | POA: Diagnosis not present

## 2015-10-22 ENCOUNTER — Encounter (HOSPITAL_BASED_OUTPATIENT_CLINIC_OR_DEPARTMENT_OTHER): Payer: PPO | Attending: Surgery

## 2015-10-22 DIAGNOSIS — I10 Essential (primary) hypertension: Secondary | ICD-10-CM | POA: Diagnosis not present

## 2015-10-22 DIAGNOSIS — E1140 Type 2 diabetes mellitus with diabetic neuropathy, unspecified: Secondary | ICD-10-CM | POA: Diagnosis not present

## 2015-10-22 DIAGNOSIS — E1151 Type 2 diabetes mellitus with diabetic peripheral angiopathy without gangrene: Secondary | ICD-10-CM | POA: Diagnosis not present

## 2015-10-22 DIAGNOSIS — M1990 Unspecified osteoarthritis, unspecified site: Secondary | ICD-10-CM | POA: Diagnosis not present

## 2015-10-22 DIAGNOSIS — L97511 Non-pressure chronic ulcer of other part of right foot limited to breakdown of skin: Secondary | ICD-10-CM | POA: Diagnosis not present

## 2015-10-22 DIAGNOSIS — Z8631 Personal history of diabetic foot ulcer: Secondary | ICD-10-CM | POA: Insufficient documentation

## 2015-10-22 DIAGNOSIS — E11621 Type 2 diabetes mellitus with foot ulcer: Secondary | ICD-10-CM | POA: Diagnosis not present

## 2015-10-22 DIAGNOSIS — G4730 Sleep apnea, unspecified: Secondary | ICD-10-CM | POA: Insufficient documentation

## 2015-10-22 DIAGNOSIS — I739 Peripheral vascular disease, unspecified: Secondary | ICD-10-CM | POA: Diagnosis not present

## 2015-10-29 ENCOUNTER — Encounter (HOSPITAL_BASED_OUTPATIENT_CLINIC_OR_DEPARTMENT_OTHER): Payer: PPO | Attending: Surgery

## 2015-10-29 DIAGNOSIS — E1151 Type 2 diabetes mellitus with diabetic peripheral angiopathy without gangrene: Secondary | ICD-10-CM | POA: Diagnosis not present

## 2015-10-29 DIAGNOSIS — E11621 Type 2 diabetes mellitus with foot ulcer: Secondary | ICD-10-CM | POA: Insufficient documentation

## 2015-10-29 DIAGNOSIS — Z8631 Personal history of diabetic foot ulcer: Secondary | ICD-10-CM | POA: Diagnosis not present

## 2015-10-29 DIAGNOSIS — E1140 Type 2 diabetes mellitus with diabetic neuropathy, unspecified: Secondary | ICD-10-CM | POA: Insufficient documentation

## 2015-10-29 DIAGNOSIS — I10 Essential (primary) hypertension: Secondary | ICD-10-CM | POA: Insufficient documentation

## 2015-10-29 DIAGNOSIS — I739 Peripheral vascular disease, unspecified: Secondary | ICD-10-CM | POA: Diagnosis not present

## 2015-10-29 DIAGNOSIS — M1990 Unspecified osteoarthritis, unspecified site: Secondary | ICD-10-CM | POA: Diagnosis not present

## 2015-10-29 DIAGNOSIS — L97511 Non-pressure chronic ulcer of other part of right foot limited to breakdown of skin: Secondary | ICD-10-CM | POA: Insufficient documentation

## 2015-10-29 DIAGNOSIS — L97512 Non-pressure chronic ulcer of other part of right foot with fat layer exposed: Secondary | ICD-10-CM | POA: Diagnosis not present

## 2015-10-29 DIAGNOSIS — G4730 Sleep apnea, unspecified: Secondary | ICD-10-CM | POA: Diagnosis not present

## 2015-11-04 ENCOUNTER — Ambulatory Visit: Payer: PPO | Admitting: Podiatry

## 2015-11-05 DIAGNOSIS — E11621 Type 2 diabetes mellitus with foot ulcer: Secondary | ICD-10-CM | POA: Diagnosis not present

## 2015-11-05 DIAGNOSIS — I739 Peripheral vascular disease, unspecified: Secondary | ICD-10-CM | POA: Diagnosis not present

## 2015-11-05 DIAGNOSIS — L97511 Non-pressure chronic ulcer of other part of right foot limited to breakdown of skin: Secondary | ICD-10-CM | POA: Diagnosis not present

## 2015-11-05 DIAGNOSIS — Z8631 Personal history of diabetic foot ulcer: Secondary | ICD-10-CM | POA: Diagnosis not present

## 2015-11-06 ENCOUNTER — Encounter: Payer: Self-pay | Admitting: Cardiology

## 2015-11-12 DIAGNOSIS — E11621 Type 2 diabetes mellitus with foot ulcer: Secondary | ICD-10-CM | POA: Diagnosis not present

## 2015-11-12 DIAGNOSIS — L97511 Non-pressure chronic ulcer of other part of right foot limited to breakdown of skin: Secondary | ICD-10-CM | POA: Diagnosis not present

## 2015-11-19 ENCOUNTER — Encounter (HOSPITAL_BASED_OUTPATIENT_CLINIC_OR_DEPARTMENT_OTHER): Payer: PPO | Attending: Surgery

## 2015-11-19 DIAGNOSIS — Z8631 Personal history of diabetic foot ulcer: Secondary | ICD-10-CM | POA: Diagnosis not present

## 2015-11-19 DIAGNOSIS — E1140 Type 2 diabetes mellitus with diabetic neuropathy, unspecified: Secondary | ICD-10-CM | POA: Insufficient documentation

## 2015-11-19 DIAGNOSIS — E1151 Type 2 diabetes mellitus with diabetic peripheral angiopathy without gangrene: Secondary | ICD-10-CM | POA: Diagnosis not present

## 2015-11-19 DIAGNOSIS — Z8673 Personal history of transient ischemic attack (TIA), and cerebral infarction without residual deficits: Secondary | ICD-10-CM | POA: Diagnosis not present

## 2015-11-19 DIAGNOSIS — I10 Essential (primary) hypertension: Secondary | ICD-10-CM | POA: Diagnosis not present

## 2015-11-19 DIAGNOSIS — M1990 Unspecified osteoarthritis, unspecified site: Secondary | ICD-10-CM | POA: Diagnosis not present

## 2015-11-19 DIAGNOSIS — E11621 Type 2 diabetes mellitus with foot ulcer: Secondary | ICD-10-CM | POA: Insufficient documentation

## 2015-11-19 DIAGNOSIS — G4730 Sleep apnea, unspecified: Secondary | ICD-10-CM | POA: Insufficient documentation

## 2015-11-19 DIAGNOSIS — L97511 Non-pressure chronic ulcer of other part of right foot limited to breakdown of skin: Secondary | ICD-10-CM | POA: Insufficient documentation

## 2015-11-19 DIAGNOSIS — I739 Peripheral vascular disease, unspecified: Secondary | ICD-10-CM | POA: Diagnosis not present

## 2015-11-24 ENCOUNTER — Encounter (HOSPITAL_COMMUNITY): Payer: Self-pay

## 2015-11-26 DIAGNOSIS — Z8631 Personal history of diabetic foot ulcer: Secondary | ICD-10-CM | POA: Diagnosis not present

## 2015-11-26 DIAGNOSIS — L97511 Non-pressure chronic ulcer of other part of right foot limited to breakdown of skin: Secondary | ICD-10-CM | POA: Diagnosis not present

## 2015-11-26 DIAGNOSIS — E11621 Type 2 diabetes mellitus with foot ulcer: Secondary | ICD-10-CM | POA: Diagnosis not present

## 2015-11-26 DIAGNOSIS — I739 Peripheral vascular disease, unspecified: Secondary | ICD-10-CM | POA: Diagnosis not present

## 2015-12-03 DIAGNOSIS — I739 Peripheral vascular disease, unspecified: Secondary | ICD-10-CM | POA: Diagnosis not present

## 2015-12-03 DIAGNOSIS — E11621 Type 2 diabetes mellitus with foot ulcer: Secondary | ICD-10-CM | POA: Diagnosis not present

## 2015-12-03 DIAGNOSIS — Z8631 Personal history of diabetic foot ulcer: Secondary | ICD-10-CM | POA: Diagnosis not present

## 2015-12-03 DIAGNOSIS — L97512 Non-pressure chronic ulcer of other part of right foot with fat layer exposed: Secondary | ICD-10-CM | POA: Diagnosis not present

## 2015-12-10 DIAGNOSIS — L97512 Non-pressure chronic ulcer of other part of right foot with fat layer exposed: Secondary | ICD-10-CM | POA: Diagnosis not present

## 2015-12-10 DIAGNOSIS — Z8631 Personal history of diabetic foot ulcer: Secondary | ICD-10-CM | POA: Diagnosis not present

## 2015-12-10 DIAGNOSIS — E11621 Type 2 diabetes mellitus with foot ulcer: Secondary | ICD-10-CM | POA: Diagnosis not present

## 2015-12-10 DIAGNOSIS — I739 Peripheral vascular disease, unspecified: Secondary | ICD-10-CM | POA: Diagnosis not present

## 2015-12-17 DIAGNOSIS — I739 Peripheral vascular disease, unspecified: Secondary | ICD-10-CM | POA: Diagnosis not present

## 2015-12-17 DIAGNOSIS — E11621 Type 2 diabetes mellitus with foot ulcer: Secondary | ICD-10-CM | POA: Diagnosis not present

## 2015-12-17 DIAGNOSIS — Z8631 Personal history of diabetic foot ulcer: Secondary | ICD-10-CM | POA: Diagnosis not present

## 2015-12-17 DIAGNOSIS — L97512 Non-pressure chronic ulcer of other part of right foot with fat layer exposed: Secondary | ICD-10-CM | POA: Diagnosis not present

## 2015-12-24 ENCOUNTER — Encounter (HOSPITAL_BASED_OUTPATIENT_CLINIC_OR_DEPARTMENT_OTHER): Payer: PPO | Attending: Surgery

## 2015-12-24 DIAGNOSIS — L97511 Non-pressure chronic ulcer of other part of right foot limited to breakdown of skin: Secondary | ICD-10-CM | POA: Insufficient documentation

## 2015-12-24 DIAGNOSIS — M1990 Unspecified osteoarthritis, unspecified site: Secondary | ICD-10-CM | POA: Insufficient documentation

## 2015-12-24 DIAGNOSIS — E11621 Type 2 diabetes mellitus with foot ulcer: Secondary | ICD-10-CM | POA: Diagnosis not present

## 2015-12-24 DIAGNOSIS — E1140 Type 2 diabetes mellitus with diabetic neuropathy, unspecified: Secondary | ICD-10-CM | POA: Insufficient documentation

## 2015-12-24 DIAGNOSIS — Z8631 Personal history of diabetic foot ulcer: Secondary | ICD-10-CM | POA: Diagnosis not present

## 2015-12-24 DIAGNOSIS — L97512 Non-pressure chronic ulcer of other part of right foot with fat layer exposed: Secondary | ICD-10-CM | POA: Diagnosis not present

## 2015-12-24 DIAGNOSIS — I10 Essential (primary) hypertension: Secondary | ICD-10-CM | POA: Diagnosis not present

## 2015-12-24 DIAGNOSIS — G4730 Sleep apnea, unspecified: Secondary | ICD-10-CM | POA: Diagnosis not present

## 2015-12-24 DIAGNOSIS — I739 Peripheral vascular disease, unspecified: Secondary | ICD-10-CM | POA: Diagnosis not present

## 2015-12-31 DIAGNOSIS — I739 Peripheral vascular disease, unspecified: Secondary | ICD-10-CM | POA: Diagnosis not present

## 2015-12-31 DIAGNOSIS — Z8631 Personal history of diabetic foot ulcer: Secondary | ICD-10-CM | POA: Diagnosis not present

## 2015-12-31 DIAGNOSIS — E11621 Type 2 diabetes mellitus with foot ulcer: Secondary | ICD-10-CM | POA: Diagnosis not present

## 2015-12-31 DIAGNOSIS — L97511 Non-pressure chronic ulcer of other part of right foot limited to breakdown of skin: Secondary | ICD-10-CM | POA: Diagnosis not present

## 2016-01-01 ENCOUNTER — Encounter (HOSPITAL_COMMUNITY): Payer: Self-pay | Admitting: Cardiovascular Disease

## 2016-01-07 DIAGNOSIS — I739 Peripheral vascular disease, unspecified: Secondary | ICD-10-CM | POA: Diagnosis not present

## 2016-01-07 DIAGNOSIS — Z8631 Personal history of diabetic foot ulcer: Secondary | ICD-10-CM | POA: Diagnosis not present

## 2016-01-07 DIAGNOSIS — E11621 Type 2 diabetes mellitus with foot ulcer: Secondary | ICD-10-CM | POA: Diagnosis not present

## 2016-01-07 DIAGNOSIS — L97511 Non-pressure chronic ulcer of other part of right foot limited to breakdown of skin: Secondary | ICD-10-CM | POA: Diagnosis not present

## 2016-01-13 ENCOUNTER — Encounter (HOSPITAL_COMMUNITY): Payer: Self-pay | Admitting: *Deleted

## 2016-01-13 ENCOUNTER — Emergency Department (HOSPITAL_COMMUNITY): Payer: PPO

## 2016-01-13 ENCOUNTER — Emergency Department (HOSPITAL_COMMUNITY)
Admission: EM | Admit: 2016-01-13 | Discharge: 2016-01-13 | Disposition: A | Discharge status: Home / Self Care | Payer: PPO | Attending: Emergency Medicine | Admitting: Emergency Medicine

## 2016-01-13 DIAGNOSIS — Z8673 Personal history of transient ischemic attack (TIA), and cerebral infarction without residual deficits: Secondary | ICD-10-CM | POA: Insufficient documentation

## 2016-01-13 DIAGNOSIS — Y9389 Activity, other specified: Secondary | ICD-10-CM | POA: Insufficient documentation

## 2016-01-13 DIAGNOSIS — Z79899 Other long term (current) drug therapy: Secondary | ICD-10-CM | POA: Diagnosis not present

## 2016-01-13 DIAGNOSIS — Y999 Unspecified external cause status: Secondary | ICD-10-CM | POA: Insufficient documentation

## 2016-01-13 DIAGNOSIS — Y929 Unspecified place or not applicable: Secondary | ICD-10-CM | POA: Insufficient documentation

## 2016-01-13 DIAGNOSIS — I10 Essential (primary) hypertension: Secondary | ICD-10-CM | POA: Diagnosis not present

## 2016-01-13 DIAGNOSIS — E785 Hyperlipidemia, unspecified: Secondary | ICD-10-CM | POA: Insufficient documentation

## 2016-01-13 DIAGNOSIS — W010XXA Fall on same level from slipping, tripping and stumbling without subsequent striking against object, initial encounter: Secondary | ICD-10-CM | POA: Insufficient documentation

## 2016-01-13 DIAGNOSIS — S0003XA Contusion of scalp, initial encounter: Secondary | ICD-10-CM | POA: Diagnosis not present

## 2016-01-13 DIAGNOSIS — Z87891 Personal history of nicotine dependence: Secondary | ICD-10-CM | POA: Diagnosis not present

## 2016-01-13 DIAGNOSIS — S098XXA Other specified injuries of head, initial encounter: Secondary | ICD-10-CM | POA: Diagnosis not present

## 2016-01-13 DIAGNOSIS — Z7982 Long term (current) use of aspirin: Secondary | ICD-10-CM | POA: Diagnosis not present

## 2016-01-13 DIAGNOSIS — S0990XA Unspecified injury of head, initial encounter: Secondary | ICD-10-CM

## 2016-01-13 DIAGNOSIS — M1990 Unspecified osteoarthritis, unspecified site: Secondary | ICD-10-CM | POA: Insufficient documentation

## 2016-01-13 DIAGNOSIS — E039 Hypothyroidism, unspecified: Secondary | ICD-10-CM | POA: Insufficient documentation

## 2016-01-13 DIAGNOSIS — E1151 Type 2 diabetes mellitus with diabetic peripheral angiopathy without gangrene: Secondary | ICD-10-CM | POA: Diagnosis not present

## 2016-01-13 NOTE — ED Notes (Signed)
Pt states he tripped putting on his pants around 1000. He fell onto his left head. Pt has notable swelling. Pt is on blood thinners. No dizziness or nausea.

## 2016-01-13 NOTE — Discharge Instructions (Signed)
Return for vomiting, lethargy, confusion or other new concerns. Ice and tylenol for pain.  If you were given medicines take as directed.  If you are on coumadin or contraceptives realize their levels and effectiveness is altered by many different medicines.  If you have any reaction (rash, tongues swelling, other) to the medicines stop taking and see a physician.    If your blood pressure was elevated in the ER make sure you follow up for management with a primary doctor or return for chest pain, shortness of breath or stroke symptoms.  Please follow up as directed and return to the ER or see a physician for new or worsening symptoms.  Thank you. Filed Vitals:   01/13/16 1123  BP: 144/73  Pulse: 73  Temp: 97.7 F (36.5 C)  TempSrc: Oral  Resp: 18  Height: 6' (1.829 m)  Weight: 232 lb (105.235 kg)  SpO2: 96%

## 2016-01-13 NOTE — ED Provider Notes (Signed)
CSN: [id number]     Arrival date & time 01/13/16  1056 History  By signing my name below, I, Eustaquio Maize, attest that this documentation has been prepared under the direction and in the presence of Elnora Morrison, MD. Electronically Signed: Eustaquio Maize, ED Scribe. 01/13/2016. 11:59 AM.    Chief Complaint  Patient presents with  . Head Injury   The history is provided by the patient and the spouse. No language interpreter was used.     HPI Comments: [name] is a 70 y.o. male with PMHx HTN, DM, HLD, and stroke who presents to the Emergency Department complaining of head injury s/p ground level fall that occurred around 10 AM this morning (approximately 2 hours ago). Pt states that he was putting his pants on when he slipped and hit his head on the ground. Pt is unsure if he had LOC. Pt was able to get up on his own without assistance. Pt states he feels at his baseline. Denies chest pain, shortness of breath, fever, chills, abdominal pain, vomiting, diarrhea, hematochezia, or any other associated symptoms. Pt is currently on Plavix.    Past Medical History  Diagnosis Date  . Hypertension   . Diabetes mellitus   . Carotid artery occlusion   . Stroke [facility]) March 08, 2012  . Hyperlipidemia   . Peripheral arterial disease (HCC)     nonhealing ulcers bilaterally on each great toe  . PONV (postoperative nausea and vomiting)   . Arthritis   . Hypothyroidism   . Shortness of breath   . Obstructive sleep apnea    Past Surgical History  Procedure Laterality Date  . Back surgery    . Cervical fusion    . Foot surgery    . Spine surgery    . Eye surgery    . Endarterectomy Left 11/29/2013    Procedure: ENDARTERECTOMY CAROTID;  Surgeon: Serafina Mitchell, MD;  [facility] OR;  Service: Vascular;  Laterality: Left;  . Carotid endarterectomy Left   . Pv angiogram  02/18/2014    tibial vessel diseas bil.  . Angioplasty  02/28/14    diamond back orbital rotational atherectomy of Rt.  tibial  . Lower ext duplex doppler  03/14/14    Rt ABI 1.2  . Lower extremity angiogram Bilateral 02/18/2014    Procedure: LOWER EXTREMITY ANGIOGRAM;  Surgeon: [name], MD;  Location: [facility] CATH LAB;  Service: Cardiovascular;  Laterality: Bilateral;  . Lower extremity angiogram N/A 10/31/2014    Procedure: LOWER EXTREMITY ANGIOGRAM;  Surgeon: [name], MD;  Location: [facility] CATH LAB;  Service: Cardiovascular;  Laterality: N/A;   Family History  Problem Relation Age of Onset  . Heart disease Mother   . Hypertension Mother   . Heart attack Mother   . Diabetes Son   . Heart disease Son   . Hypertension Son   . Hypertension Father   . Diabetes Father    Social History  Substance Use Topics  . Smoking status: Former Smoker -- 1 years    Types: Pipe    Quit date: 07/08/1977  . Smokeless tobacco: Never Used  . Alcohol Use: No    Review of Systems  Constitutional: Negative for fever and chills.  Respiratory: Negative for shortness of breath.   Cardiovascular: Negative for chest pain.  Gastrointestinal: Negative for vomiting, abdominal pain, diarrhea and blood in stool.  Hematological: Bruises/bleeds easily.  All other systems reviewed and are negative.  Allergies  Codeine and Tramadol  Home Medications   Prior to Admission medications   Medication Sig Start Date End Date Taking? Authorizing Provider  amLODipine (NORVASC) 10 MG tablet Take 10 mg by mouth daily.  05/28/14  Yes Historical Provider, MD  aspirin EC 81 MG tablet Take 1 tablet (81 mg total) by mouth daily. Patient taking differently: Take 325 mg by mouth daily.  11/26/14  Yes Erlene Quan, PA-C  b complex vitamins tablet Take 1 tablet by mouth daily.   Yes Historical Provider, MD  clopidogrel (PLAVIX) 75 MG tablet Take 75 mg by mouth daily.  06/16/13  Yes Historical Provider, MD  DULoxetine (CYMBALTA) 20 MG capsule Take 1 capsule by mouth 2 (two) times daily. 07/30/14  Yes Historical Provider, MD   Empagliflozin-Linagliptin 10-5 MG TABS Take 1 tablet by mouth daily.   Yes Historical Provider, MD  gabapentin (NEURONTIN) 600 MG tablet Take 600 mg by mouth 4 (four) times daily.    Yes Historical Provider, MD  levothyroxine (SYNTHROID, LEVOTHROID) 25 MCG tablet Take 50 mcg by mouth daily.  06/08/13  Yes Historical Provider, MD  Omega 3-6-9 Fatty Acids (OMEGA 3-6-9 COMPLEX) CAPS Take 1 capsule by mouth 2 (two) times daily.   Yes Historical Provider, MD  pravastatin (PRAVACHOL) 40 MG tablet Take 40 mg by mouth every morning.  10/06/12  Yes Historical Provider, MD  rOPINIRole (REQUIP) 0.5 MG tablet Take 1 tablet (0.5 mg total) by mouth at bedtime. 10/29/13  Yes Luke K Kilroy, PA-C  saccharomyces boulardii (FLORASTOR) 250 MG capsule Take 1 capsule (250 mg total) by mouth 2 (two) times daily. 11/01/14  Yes Janece Canterbury, MD  sitaGLIPtin (JANUVIA) 100 MG tablet Take 100 mg by mouth daily.   Yes Historical Provider, MD   BP 144/73 mmHg  Pulse 73  Temp(Src) 97.7 F (36.5 C) (Oral)  Resp 18  Ht 6' (1.829 m)  Wt 232 lb (105.235 kg)  BMI 31.46 kg/m2  SpO2 96%   Physical Exam  Constitutional: He is oriented to person, place, and time. He appears well-developed and well-nourished. No distress.  HENT:  Head: Normocephalic.  Moderate hematoma left frontal and temporal region. Ecchymosis.   Eyes: Conjunctivae and EOM are normal. Pupils are equal, round, and reactive to light.  Neck: Normal range of motion. Neck supple. No tracheal deviation present.  Cardiovascular: Normal rate, regular rhythm and normal heart sounds.   Pulmonary/Chest: Effort normal. No respiratory distress.  Abdominal: Soft. There is no tenderness.  Musculoskeletal: Normal range of motion.  No midline cervical tenderness. Good horizontal ROM.  Mild edema to right ankle.   Neurological: He is alert and oriented to person, place, and time.  No obvious facial droop.  Normal finger to nose.   Skin: Skin is warm and dry.   Psychiatric: He has a normal mood and affect. His behavior is normal.  Nursing note and vitals reviewed.   ED Course  Procedures (including critical care time)  DIAGNOSTIC STUDIES: Oxygen Saturation is 96% on RA, normal by my interpretation.    COORDINATION OF CARE: 11:58 AM-Discussed treatment plan which includes CT Head with pt at bedside and pt agreed to plan.   Labs Review Labs Reviewed - No data to display  Imaging Review Ct Head Wo Contrast  01/13/2016  CLINICAL DATA:  Status post fall today with the trauma to the left forehead. EXAM: CT HEAD WITHOUT CONTRAST TECHNIQUE: Contiguous axial images were obtained from the base of the skull through the vertex without intravenous contrast. COMPARISON:  March 08, 2012 FINDINGS: There is a large left frontal scalp hematoma. There is no midline shift, hydrocephalus, or mass. No acute hemorrhage or acute transcortical infarct is identified. There is chronic bilateral periventricular white matter small vessel ischemic change stable. Old infarcts identified in the right basal ganglia stable. The bony calvarium is intact. The visualized sinuses are clear. IMPRESSION: Large left frontal scalp hematoma. No focal acute intracranial abnormality identified. Chronic bilateral periventricular white matter small vessel ischemic change and old small infarcts in right basal ganglia. Electronically Signed   By: Abelardo Diesel M.D.   On: 01/13/2016 12:23   I have personally reviewed and evaluated these images and lab results as part of my medical decision-making.   EKG Interpretation None      MDM   Final diagnoses:  Acute head injury, initial encounter  Scalp hematoma, initial encounter   Patient on Plavix presents after head injury with large hematoma. CT scan performed no intracranial abnormality. Supportive care discussed reasons to return. Mechanical fall.  Results and differential diagnosis were discussed with the patient/parent/guardian. Xrays  were independently reviewed by myself.  Close follow up outpatient was discussed, comfortable with the plan.   Medications - No data to display  Filed Vitals:   01/13/16 1123  BP: 144/73  Pulse: 73  Temp: 97.7 F (36.5 C)  TempSrc: Oral  Resp: 18  Height: 6' (1.829 m)  Weight: 232 lb (105.235 kg)  SpO2: 96%    Final diagnoses:  Acute head injury, initial encounter  Scalp hematoma, initial encounter       Elnora Morrison, MD 01/13/16 1300

## 2016-01-14 DIAGNOSIS — Z8631 Personal history of diabetic foot ulcer: Secondary | ICD-10-CM | POA: Diagnosis not present

## 2016-01-14 DIAGNOSIS — E11621 Type 2 diabetes mellitus with foot ulcer: Secondary | ICD-10-CM | POA: Diagnosis not present

## 2016-01-14 DIAGNOSIS — L97511 Non-pressure chronic ulcer of other part of right foot limited to breakdown of skin: Secondary | ICD-10-CM | POA: Diagnosis not present

## 2016-01-14 DIAGNOSIS — I739 Peripheral vascular disease, unspecified: Secondary | ICD-10-CM | POA: Diagnosis not present

## 2016-01-21 ENCOUNTER — Encounter (HOSPITAL_BASED_OUTPATIENT_CLINIC_OR_DEPARTMENT_OTHER): Payer: PPO | Attending: Surgery

## 2016-01-21 DIAGNOSIS — E11621 Type 2 diabetes mellitus with foot ulcer: Secondary | ICD-10-CM | POA: Insufficient documentation

## 2016-01-21 DIAGNOSIS — G4730 Sleep apnea, unspecified: Secondary | ICD-10-CM | POA: Diagnosis not present

## 2016-01-21 DIAGNOSIS — I739 Peripheral vascular disease, unspecified: Secondary | ICD-10-CM | POA: Diagnosis not present

## 2016-01-21 DIAGNOSIS — E1151 Type 2 diabetes mellitus with diabetic peripheral angiopathy without gangrene: Secondary | ICD-10-CM | POA: Insufficient documentation

## 2016-01-21 DIAGNOSIS — Z8631 Personal history of diabetic foot ulcer: Secondary | ICD-10-CM | POA: Diagnosis not present

## 2016-01-21 DIAGNOSIS — E1140 Type 2 diabetes mellitus with diabetic neuropathy, unspecified: Secondary | ICD-10-CM | POA: Insufficient documentation

## 2016-01-21 DIAGNOSIS — L97511 Non-pressure chronic ulcer of other part of right foot limited to breakdown of skin: Secondary | ICD-10-CM | POA: Diagnosis not present

## 2016-01-21 DIAGNOSIS — I10 Essential (primary) hypertension: Secondary | ICD-10-CM | POA: Insufficient documentation

## 2016-01-23 DIAGNOSIS — G4733 Obstructive sleep apnea (adult) (pediatric): Secondary | ICD-10-CM | POA: Diagnosis not present

## 2016-01-28 DIAGNOSIS — X35XXXA Volcanic eruption, initial encounter: Secondary | ICD-10-CM | POA: Diagnosis not present

## 2016-01-28 DIAGNOSIS — L97511 Non-pressure chronic ulcer of other part of right foot limited to breakdown of skin: Secondary | ICD-10-CM | POA: Diagnosis not present

## 2016-01-28 DIAGNOSIS — J209 Acute bronchitis, unspecified: Secondary | ICD-10-CM | POA: Diagnosis not present

## 2016-01-28 DIAGNOSIS — I10 Essential (primary) hypertension: Secondary | ICD-10-CM | POA: Diagnosis not present

## 2016-01-28 DIAGNOSIS — E11621 Type 2 diabetes mellitus with foot ulcer: Secondary | ICD-10-CM | POA: Diagnosis not present

## 2016-01-28 DIAGNOSIS — I739 Peripheral vascular disease, unspecified: Secondary | ICD-10-CM | POA: Diagnosis not present

## 2016-01-28 DIAGNOSIS — Z8631 Personal history of diabetic foot ulcer: Secondary | ICD-10-CM | POA: Diagnosis not present

## 2016-01-28 DIAGNOSIS — N138 Other obstructive and reflux uropathy: Secondary | ICD-10-CM | POA: Diagnosis not present

## 2016-01-30 DIAGNOSIS — Z125 Encounter for screening for malignant neoplasm of prostate: Secondary | ICD-10-CM | POA: Diagnosis not present

## 2016-01-30 DIAGNOSIS — E11649 Type 2 diabetes mellitus with hypoglycemia without coma: Secondary | ICD-10-CM | POA: Diagnosis not present

## 2016-01-30 DIAGNOSIS — N138 Other obstructive and reflux uropathy: Secondary | ICD-10-CM | POA: Diagnosis not present

## 2016-01-30 DIAGNOSIS — I10 Essential (primary) hypertension: Secondary | ICD-10-CM | POA: Diagnosis not present

## 2016-01-30 DIAGNOSIS — J209 Acute bronchitis, unspecified: Secondary | ICD-10-CM | POA: Diagnosis not present

## 2016-02-02 DIAGNOSIS — C44622 Squamous cell carcinoma of skin of right upper limb, including shoulder: Secondary | ICD-10-CM | POA: Diagnosis not present

## 2016-02-04 DIAGNOSIS — I739 Peripheral vascular disease, unspecified: Secondary | ICD-10-CM | POA: Diagnosis not present

## 2016-02-04 DIAGNOSIS — E11621 Type 2 diabetes mellitus with foot ulcer: Secondary | ICD-10-CM | POA: Diagnosis not present

## 2016-02-04 DIAGNOSIS — L97511 Non-pressure chronic ulcer of other part of right foot limited to breakdown of skin: Secondary | ICD-10-CM | POA: Diagnosis not present

## 2016-02-04 DIAGNOSIS — Z8631 Personal history of diabetic foot ulcer: Secondary | ICD-10-CM | POA: Diagnosis not present

## 2016-02-11 DIAGNOSIS — E11621 Type 2 diabetes mellitus with foot ulcer: Secondary | ICD-10-CM | POA: Diagnosis not present

## 2016-02-11 DIAGNOSIS — I739 Peripheral vascular disease, unspecified: Secondary | ICD-10-CM | POA: Diagnosis not present

## 2016-02-11 DIAGNOSIS — Z8631 Personal history of diabetic foot ulcer: Secondary | ICD-10-CM | POA: Diagnosis not present

## 2016-02-11 DIAGNOSIS — L97809 Non-pressure chronic ulcer of other part of unspecified lower leg with unspecified severity: Secondary | ICD-10-CM | POA: Diagnosis not present

## 2016-02-11 DIAGNOSIS — L97511 Non-pressure chronic ulcer of other part of right foot limited to breakdown of skin: Secondary | ICD-10-CM | POA: Diagnosis not present

## 2016-02-12 DIAGNOSIS — I739 Peripheral vascular disease, unspecified: Secondary | ICD-10-CM | POA: Diagnosis not present

## 2016-02-12 DIAGNOSIS — L97512 Non-pressure chronic ulcer of other part of right foot with fat layer exposed: Secondary | ICD-10-CM | POA: Diagnosis not present

## 2016-02-12 DIAGNOSIS — L97809 Non-pressure chronic ulcer of other part of unspecified lower leg with unspecified severity: Secondary | ICD-10-CM | POA: Diagnosis not present

## 2016-02-12 DIAGNOSIS — L97519 Non-pressure chronic ulcer of other part of right foot with unspecified severity: Secondary | ICD-10-CM | POA: Diagnosis not present

## 2016-02-12 DIAGNOSIS — E11621 Type 2 diabetes mellitus with foot ulcer: Secondary | ICD-10-CM | POA: Diagnosis not present

## 2016-02-13 DIAGNOSIS — L97512 Non-pressure chronic ulcer of other part of right foot with fat layer exposed: Secondary | ICD-10-CM | POA: Diagnosis not present

## 2016-02-18 DIAGNOSIS — I739 Peripheral vascular disease, unspecified: Secondary | ICD-10-CM | POA: Diagnosis not present

## 2016-02-18 DIAGNOSIS — L97511 Non-pressure chronic ulcer of other part of right foot limited to breakdown of skin: Secondary | ICD-10-CM | POA: Diagnosis not present

## 2016-02-18 DIAGNOSIS — Z8631 Personal history of diabetic foot ulcer: Secondary | ICD-10-CM | POA: Diagnosis not present

## 2016-02-18 DIAGNOSIS — E11621 Type 2 diabetes mellitus with foot ulcer: Secondary | ICD-10-CM | POA: Diagnosis not present

## 2016-02-19 DIAGNOSIS — M79644 Pain in right finger(s): Secondary | ICD-10-CM | POA: Diagnosis not present

## 2016-02-19 DIAGNOSIS — E1169 Type 2 diabetes mellitus with other specified complication: Secondary | ICD-10-CM | POA: Diagnosis not present

## 2016-02-19 DIAGNOSIS — C44622 Squamous cell carcinoma of skin of right upper limb, including shoulder: Secondary | ICD-10-CM | POA: Diagnosis not present

## 2016-02-20 ENCOUNTER — Other Ambulatory Visit: Payer: Self-pay | Admitting: Cardiovascular Disease

## 2016-02-20 DIAGNOSIS — I739 Peripheral vascular disease, unspecified: Secondary | ICD-10-CM

## 2016-02-24 ENCOUNTER — Ambulatory Visit (HOSPITAL_COMMUNITY)
Admission: RE | Admit: 2016-02-24 | Discharge: 2016-02-24 | Disposition: A | Discharge status: Home / Self Care | Payer: PPO | Source: Ambulatory Visit | Attending: Cardiovascular Disease | Admitting: Cardiovascular Disease

## 2016-02-24 DIAGNOSIS — I10 Essential (primary) hypertension: Secondary | ICD-10-CM | POA: Diagnosis not present

## 2016-02-24 DIAGNOSIS — I739 Peripheral vascular disease, unspecified: Secondary | ICD-10-CM | POA: Insufficient documentation

## 2016-02-24 DIAGNOSIS — E119 Type 2 diabetes mellitus without complications: Secondary | ICD-10-CM | POA: Diagnosis not present

## 2016-02-24 DIAGNOSIS — E785 Hyperlipidemia, unspecified: Secondary | ICD-10-CM | POA: Diagnosis not present

## 2016-02-24 DIAGNOSIS — I70201 Unspecified atherosclerosis of native arteries of extremities, right leg: Secondary | ICD-10-CM | POA: Insufficient documentation

## 2016-02-24 DIAGNOSIS — L97512 Non-pressure chronic ulcer of other part of right foot with fat layer exposed: Secondary | ICD-10-CM | POA: Diagnosis not present

## 2016-02-24 DIAGNOSIS — G4733 Obstructive sleep apnea (adult) (pediatric): Secondary | ICD-10-CM | POA: Diagnosis not present

## 2016-02-25 ENCOUNTER — Encounter (HOSPITAL_BASED_OUTPATIENT_CLINIC_OR_DEPARTMENT_OTHER): Payer: PPO | Attending: Surgery

## 2016-02-25 DIAGNOSIS — E1151 Type 2 diabetes mellitus with diabetic peripheral angiopathy without gangrene: Secondary | ICD-10-CM | POA: Diagnosis not present

## 2016-02-25 DIAGNOSIS — I70235 Atherosclerosis of native arteries of right leg with ulceration of other part of foot: Secondary | ICD-10-CM | POA: Diagnosis not present

## 2016-02-25 DIAGNOSIS — E11621 Type 2 diabetes mellitus with foot ulcer: Secondary | ICD-10-CM | POA: Insufficient documentation

## 2016-02-25 DIAGNOSIS — Z79899 Other long term (current) drug therapy: Secondary | ICD-10-CM | POA: Insufficient documentation

## 2016-02-25 DIAGNOSIS — Z7982 Long term (current) use of aspirin: Secondary | ICD-10-CM | POA: Diagnosis not present

## 2016-02-25 DIAGNOSIS — L97511 Non-pressure chronic ulcer of other part of right foot limited to breakdown of skin: Secondary | ICD-10-CM | POA: Insufficient documentation

## 2016-02-25 DIAGNOSIS — I739 Peripheral vascular disease, unspecified: Secondary | ICD-10-CM | POA: Diagnosis not present

## 2016-02-25 DIAGNOSIS — I10 Essential (primary) hypertension: Secondary | ICD-10-CM | POA: Insufficient documentation

## 2016-02-25 DIAGNOSIS — Z8631 Personal history of diabetic foot ulcer: Secondary | ICD-10-CM | POA: Diagnosis not present

## 2016-02-25 DIAGNOSIS — E1140 Type 2 diabetes mellitus with diabetic neuropathy, unspecified: Secondary | ICD-10-CM | POA: Diagnosis not present

## 2016-02-25 DIAGNOSIS — L97512 Non-pressure chronic ulcer of other part of right foot with fat layer exposed: Secondary | ICD-10-CM | POA: Diagnosis not present

## 2016-02-25 DIAGNOSIS — G4730 Sleep apnea, unspecified: Secondary | ICD-10-CM | POA: Insufficient documentation

## 2016-03-03 ENCOUNTER — Telehealth: Payer: Self-pay | Admitting: *Deleted

## 2016-03-03 DIAGNOSIS — I739 Peripheral vascular disease, unspecified: Secondary | ICD-10-CM

## 2016-03-03 DIAGNOSIS — E11621 Type 2 diabetes mellitus with foot ulcer: Secondary | ICD-10-CM | POA: Diagnosis not present

## 2016-03-03 DIAGNOSIS — I10 Essential (primary) hypertension: Secondary | ICD-10-CM

## 2016-03-03 DIAGNOSIS — Z8631 Personal history of diabetic foot ulcer: Secondary | ICD-10-CM | POA: Diagnosis not present

## 2016-03-03 DIAGNOSIS — L97511 Non-pressure chronic ulcer of other part of right foot limited to breakdown of skin: Secondary | ICD-10-CM | POA: Diagnosis not present

## 2016-03-03 NOTE — Telephone Encounter (Signed)
Called patient to schedule an appt as he is due for his 12 month appt with Dr Gwenlyn Found. Patient was driving and asked to be called back later for appt. He gave permission for [place] to talk to his wife and have her schedule the appt if needed.  Results of ABI as follows given to pt during phone call as well. Notes Recorded by [name], MD on 02/25/2016 at 2:58 PM No change from prior study. Repeat in 12 months.  Order entered into EPIC.

## 2016-03-03 NOTE — Telephone Encounter (Signed)
Notes Recorded by [name], MD on 02/25/2016 at 2:58 PM No change from prior study. Repeat in 12 months.

## 2016-03-03 NOTE — Telephone Encounter (Signed)
-----   Message from [place] sent at 02/24/2016  1:40 PM EDT ----- Regarding: Patient results Patient complains of new right calf cramping at night for about 1 month. He is supposed to have surgery on his right foot, pending this arterial report.   Today's ABI's were 1.0 bilaterally and the duplex showed 50-74% stenosis in the proximal and distal right PTA.

## 2016-03-04 ENCOUNTER — Telehealth: Payer: Self-pay | Admitting: Cardiovascular Disease

## 2016-03-04 NOTE — Telephone Encounter (Signed)
Closed encounter °

## 2016-03-10 DIAGNOSIS — I739 Peripheral vascular disease, unspecified: Secondary | ICD-10-CM | POA: Diagnosis not present

## 2016-03-10 DIAGNOSIS — Z8631 Personal history of diabetic foot ulcer: Secondary | ICD-10-CM | POA: Diagnosis not present

## 2016-03-10 DIAGNOSIS — L97512 Non-pressure chronic ulcer of other part of right foot with fat layer exposed: Secondary | ICD-10-CM | POA: Diagnosis not present

## 2016-03-10 DIAGNOSIS — E11621 Type 2 diabetes mellitus with foot ulcer: Secondary | ICD-10-CM | POA: Diagnosis not present

## 2016-03-10 DIAGNOSIS — S91104A Unspecified open wound of right lesser toe(s) without damage to nail, initial encounter: Secondary | ICD-10-CM | POA: Diagnosis not present

## 2016-03-17 DIAGNOSIS — E11621 Type 2 diabetes mellitus with foot ulcer: Secondary | ICD-10-CM | POA: Diagnosis not present

## 2016-03-17 DIAGNOSIS — S91104A Unspecified open wound of right lesser toe(s) without damage to nail, initial encounter: Secondary | ICD-10-CM | POA: Diagnosis not present

## 2016-03-17 DIAGNOSIS — L97512 Non-pressure chronic ulcer of other part of right foot with fat layer exposed: Secondary | ICD-10-CM | POA: Diagnosis not present

## 2016-03-17 DIAGNOSIS — I739 Peripheral vascular disease, unspecified: Secondary | ICD-10-CM | POA: Diagnosis not present

## 2016-03-19 ENCOUNTER — Ambulatory Visit (INDEPENDENT_AMBULATORY_CARE_PROVIDER_SITE_OTHER): Payer: PPO | Admitting: Cardiovascular Disease

## 2016-03-19 ENCOUNTER — Encounter: Payer: Self-pay | Admitting: Cardiovascular Disease

## 2016-03-19 VITALS — BP 134/71 | HR 92 | Ht 72.0 in | Wt 249.0 lb

## 2016-03-19 DIAGNOSIS — I998 Other disorder of circulatory system: Secondary | ICD-10-CM

## 2016-03-19 DIAGNOSIS — I70229 Atherosclerosis of native arteries of extremities with rest pain, unspecified extremity: Secondary | ICD-10-CM

## 2016-03-19 DIAGNOSIS — E785 Hyperlipidemia, unspecified: Secondary | ICD-10-CM

## 2016-03-19 DIAGNOSIS — I739 Peripheral vascular disease, unspecified: Secondary | ICD-10-CM

## 2016-03-19 NOTE — Assessment & Plan Note (Signed)
History of carotid artery disease status post elective left carotid endarterectomy performed by Dr. [name]  which he follows by duplex ultrasound.

## 2016-03-19 NOTE — Assessment & Plan Note (Signed)
History of hyperlipidemia on statin therapy followed by his PCP 

## 2016-03-19 NOTE — Progress Notes (Signed)
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 He now works at [employer]. He was referred by Dr. [name] from [facility] for peripheral vasodilation because of nonhealing small ulcers on the dorsal surface of his great toes bilaterally. I last saw him in the office 02/04/15. His cardiovascular risk factor profile is remarkable for treated hypertension, diabetes and hyperlipidemia. He has had a stroke 03/08/12 and has carotid disease followed by Dr. [name] ,. He denies chest pain, shortness of breath or claudication. He has had 2 small ulcers on the dorsal surface of both great toes for 6-9 months which have been slow to heal. Since I saw him last in November he has had an elective left carotid endarterectomy performed by Dr. [name] . He was also placed on CPAP because of obstructive sleep apnea which he is benefiting from. He is compliant with his CPAP.[name] He has developed a nonhealing wound on his right great toe which had been fairly rapidly progressing and he sent back for further evaluation. I performed lower extremity Doppler studies last October suggesting tibial vessel disease..I performed angiography on him 03/19/14 demonstrating severe tibial disease and ultimately performed diamondback orbital rotational atherectomy of his right posterior tibial establishing excellent in-line flow. This resulted in ultimate healing of his right great toe ischemic ulcer, improvement in his symptoms and Dopplers as well. He has since developed an ischemic ulcer on the plantar surface of the medial aspect of his right foot as a result of trauma and is being treated by his podiatrist, Dr. Barkley Bruns.follow-up Dopplers  post intervention revealed an increase in his right ABI to 1.2 however, subsequent Dopplers performed 07/02/14 revealed a decrease in his right eye twice a day 0.75 with occlusion of his posterior tibial artery. Because of progression of the ulcer on the dorsal surface of his right heel 3 angina gram him 10/31/14 revealing a subtotally occluded right posterior tibial artery which I re-intervened on with an excellent clinical management result. His subsequent follow-up Dopplers performed 11/08/14 revealed an increase in his right ABI from 0.75 Up to 1.2. His ulcer is slowly healing. Since I saw him a year ago he's remained stable. His most recent lower extremity arterial Doppler studies performed 02/24/16 revealed normal ABIs bilaterally.   Current Outpatient Prescriptions  Medication Sig Dispense Refill  . amLODipine (NORVASC) 10 MG tablet Take 10 mg by mouth daily.     [name] aspirin EC 81 MG tablet Take 1 tablet (81 mg total) by mouth daily. (Patient taking differently: Take 325 mg by mouth daily. )    . b complex vitamins tablet Take 1 tablet by mouth daily.    . clopidogrel (PLAVIX) 75 MG tablet Take 75 mg by mouth daily.     . DULoxetine (CYMBALTA) 20 MG capsule Take 1 capsule by mouth 2 (two) times daily.    . Empagliflozin-Linagliptin 10-5 MG TABS Take 1 tablet by mouth daily.    [name] gabapentin (NEURONTIN) 600 MG tablet Take 600 mg by mouth 4 (four) times daily.     [name] levothyroxine (SYNTHROID, LEVOTHROID) 25 MCG tablet Take 50 mcg by mouth daily.     . Omega 3-6-9 Fatty Acids (OMEGA 3-6-9 COMPLEX) CAPS Take 1 capsule by mouth  2 (two) times daily.    . pravastatin (PRAVACHOL) 40 MG tablet Take 40 mg by mouth every morning.     [name] rOPINIRole (REQUIP) 0.5 MG tablet Take 1 tablet (0.5 mg total) by mouth at bedtime. 30 tablet 11  . saccharomyces boulardii (FLORASTOR) 250 MG capsule Take 1 capsule (250 mg total) by mouth 2 (two) times daily. 60 capsule 2  . sitaGLIPtin (JANUVIA) 100 MG tablet Take 100 mg by  mouth daily.     No current facility-administered medications for this visit.    Allergies  Allergen Reactions  . Codeine Nausea And Vomiting  . Tramadol Nausea Only    Social History   Social History  . Marital Status: Married    Spouse Name: N/A  . Number of Children: N/A  . Years of Education: N/A   Occupational History  . Not on file.   Social History Main Topics  . Smoking status: Former Smoker -- 1 years    Types: Pipe    Quit date: 07/08/1977  . Smokeless tobacco: Never Used  . Alcohol Use: No  . Drug Use: No  . Sexual Activity: Not on file   Other Topics Concern  . Not on file   Social History Narrative     Review of Systems: General: negative for chills, fever, night sweats or weight changes.  Cardiovascular: negative for chest pain, dyspnea on exertion, edema, orthopnea, palpitations, paroxysmal nocturnal dyspnea or shortness of breath Dermatological: negative for rash Respiratory: negative for cough or wheezing Urologic: negative for hematuria Abdominal: negative for nausea, vomiting, diarrhea, bright red blood per rectum, melena, or hematemesis Neurologic: negative for visual changes, syncope, or dizziness All other systems reviewed and are otherwise negative except as noted above.    Blood pressure 134/71, pulse 92, height 6' (1.829 m), weight 249 lb (112.946 kg).  General appearance: alert and no distress Neck: no adenopathy, no carotid bruit, no JVD, supple, symmetrical, trachea midline and thyroid not enlarged, symmetric, no tenderness/mass/nodules Lungs: clear to auscultation bilaterally Heart: regular rate and rhythm, S1, S2 normal, no murmur, click, rub or gallop Extremities: extremities normal, atraumatic, no cyanosis or edema  EKG not performed today  ASSESSMENT AND PLAN:   Hypertension History of hypertension blood pressure measured 10 134/71. He is on amlodipine. Continue current meds at current dosing  Occlusion and stenosis of  carotid artery without mention of cerebral infarction History of carotid artery disease status post elective left carotid endarterectomy performed by Dr. [name]  which he follows by duplex ultrasound.  Hyperlipidemia History of hyperlipidemia on statin therapy followed by his PCP  Critical lower limb ischemia History of critical limb ischemia with nonhealing ulcer on his right foot status post diamondback orbital rotational atherectomy of his right posterior tibial artery with restoration of in-line flow and ultimate healing of his right great toe ulcer. Because of recurrent ulcer and a decline in his right ABI are restudied him 10/31/14 revealing 95% diffuse restenosis of his posterior tibial which I re-intervened on again resulting in healing of his wound on his right foot. His most recent Doppler study performed 02/24/16 revealed normal ABIs bilaterally.      [name] MD FACP,FACC,FAHA, [facility] 03/19/2016 10:42 AM

## 2016-03-19 NOTE — Patient Instructions (Signed)
Medication Instructions:  Your physician recommends that you continue on your current medications as directed. Please refer to the Current Medication list given to you today.   Labwork: Labwork will be requested from your primary care physician.   Testing/Procedures: Your physician has requested that you have a lower extremity arterial doppler- During this test, ultrasound is used to evaluate arterial blood flow in the legs. Allow approximately one hour for this exam. IN APPROXIMATELY 1 YEAR.  Follow-Up: Your physician wants you to follow-up in: [place]. You will receive a reminder letter in the mail two months in advance. If you don't receive a letter, please call our office to schedule the follow-up appointment.   Any Other Special Instructions Will Be Listed Below (If Applicable).     If you need a refill on your cardiac medications before your next appointment, please call your pharmacy.

## 2016-03-19 NOTE — Assessment & Plan Note (Signed)
History of hypertension blood pressure measured 10 134/71. He is on amlodipine. Continue current meds at current dosing

## 2016-03-19 NOTE — Assessment & Plan Note (Signed)
History of critical limb ischemia with nonhealing ulcer on his right foot status post diamondback orbital rotational atherectomy of his right posterior tibial artery with restoration of in-line flow and ultimate healing of his right great toe ulcer. Because of recurrent ulcer and a decline in his right ABI are restudied him 10/31/14 revealing 95% diffuse restenosis of his posterior tibial which I re-intervened on again resulting in healing of his wound on his right foot. His most recent Doppler study performed 02/24/16 revealed normal ABIs bilaterally.

## 2016-03-22 DIAGNOSIS — M24573 Contracture, unspecified ankle: Secondary | ICD-10-CM | POA: Diagnosis not present

## 2016-03-22 DIAGNOSIS — L97519 Non-pressure chronic ulcer of other part of right foot with unspecified severity: Secondary | ICD-10-CM | POA: Diagnosis not present

## 2016-03-22 DIAGNOSIS — Z981 Arthrodesis status: Secondary | ICD-10-CM | POA: Diagnosis not present

## 2016-03-24 ENCOUNTER — Encounter (HOSPITAL_BASED_OUTPATIENT_CLINIC_OR_DEPARTMENT_OTHER): Payer: PPO | Attending: Surgery

## 2016-04-08 DIAGNOSIS — E1169 Type 2 diabetes mellitus with other specified complication: Secondary | ICD-10-CM | POA: Diagnosis not present

## 2016-04-08 DIAGNOSIS — M79644 Pain in right finger(s): Secondary | ICD-10-CM | POA: Diagnosis not present

## 2016-04-08 DIAGNOSIS — C44622 Squamous cell carcinoma of skin of right upper limb, including shoulder: Secondary | ICD-10-CM | POA: Diagnosis not present

## 2016-04-09 DIAGNOSIS — Z885 Allergy status to narcotic agent status: Secondary | ICD-10-CM | POA: Diagnosis not present

## 2016-04-09 DIAGNOSIS — Z7984 Long term (current) use of oral hypoglycemic drugs: Secondary | ICD-10-CM | POA: Diagnosis not present

## 2016-04-09 DIAGNOSIS — M1990 Unspecified osteoarthritis, unspecified site: Secondary | ICD-10-CM | POA: Diagnosis not present

## 2016-04-09 DIAGNOSIS — M24571 Contracture, right ankle: Secondary | ICD-10-CM | POA: Diagnosis not present

## 2016-04-09 DIAGNOSIS — E079 Disorder of thyroid, unspecified: Secondary | ICD-10-CM | POA: Diagnosis not present

## 2016-04-09 DIAGNOSIS — G4730 Sleep apnea, unspecified: Secondary | ICD-10-CM | POA: Diagnosis not present

## 2016-04-09 DIAGNOSIS — Z981 Arthrodesis status: Secondary | ICD-10-CM | POA: Diagnosis not present

## 2016-04-09 DIAGNOSIS — Z79899 Other long term (current) drug therapy: Secondary | ICD-10-CM | POA: Diagnosis not present

## 2016-04-09 DIAGNOSIS — Z8673 Personal history of transient ischemic attack (TIA), and cerebral infarction without residual deficits: Secondary | ICD-10-CM | POA: Diagnosis not present

## 2016-04-09 DIAGNOSIS — E669 Obesity, unspecified: Secondary | ICD-10-CM | POA: Diagnosis not present

## 2016-04-09 DIAGNOSIS — G2581 Restless legs syndrome: Secondary | ICD-10-CM | POA: Diagnosis not present

## 2016-04-09 DIAGNOSIS — I739 Peripheral vascular disease, unspecified: Secondary | ICD-10-CM | POA: Diagnosis not present

## 2016-04-09 DIAGNOSIS — I10 Essential (primary) hypertension: Secondary | ICD-10-CM | POA: Diagnosis not present

## 2016-04-09 DIAGNOSIS — E11621 Type 2 diabetes mellitus with foot ulcer: Secondary | ICD-10-CM | POA: Diagnosis not present

## 2016-04-09 DIAGNOSIS — L97519 Non-pressure chronic ulcer of other part of right foot with unspecified severity: Secondary | ICD-10-CM | POA: Diagnosis not present

## 2016-04-09 DIAGNOSIS — Z87891 Personal history of nicotine dependence: Secondary | ICD-10-CM | POA: Diagnosis not present

## 2016-04-09 DIAGNOSIS — Z7982 Long term (current) use of aspirin: Secondary | ICD-10-CM | POA: Diagnosis not present

## 2016-04-11 ENCOUNTER — Observation Stay (HOSPITAL_COMMUNITY)
Admission: EM | Admit: 2016-04-11 | Discharge: 2016-04-13 | Disposition: A | Discharge status: Home / Self Care | Payer: PPO | Attending: Pulmonary Disease | Admitting: Pulmonary Disease

## 2016-04-11 ENCOUNTER — Encounter (HOSPITAL_COMMUNITY): Payer: Self-pay | Admitting: Emergency Medicine

## 2016-04-11 ENCOUNTER — Emergency Department (HOSPITAL_COMMUNITY): Payer: PPO

## 2016-04-11 DIAGNOSIS — Z8673 Personal history of transient ischemic attack (TIA), and cerebral infarction without residual deficits: Secondary | ICD-10-CM | POA: Insufficient documentation

## 2016-04-11 DIAGNOSIS — Z7984 Long term (current) use of oral hypoglycemic drugs: Secondary | ICD-10-CM | POA: Diagnosis not present

## 2016-04-11 DIAGNOSIS — G4733 Obstructive sleep apnea (adult) (pediatric): Secondary | ICD-10-CM | POA: Diagnosis present

## 2016-04-11 DIAGNOSIS — Z87891 Personal history of nicotine dependence: Secondary | ICD-10-CM | POA: Diagnosis not present

## 2016-04-11 DIAGNOSIS — Z79891 Long term (current) use of opiate analgesic: Secondary | ICD-10-CM | POA: Insufficient documentation

## 2016-04-11 DIAGNOSIS — K5901 Slow transit constipation: Secondary | ICD-10-CM | POA: Diagnosis not present

## 2016-04-11 DIAGNOSIS — R0602 Shortness of breath: Secondary | ICD-10-CM | POA: Diagnosis not present

## 2016-04-11 DIAGNOSIS — Z79899 Other long term (current) drug therapy: Secondary | ICD-10-CM | POA: Insufficient documentation

## 2016-04-11 DIAGNOSIS — E785 Hyperlipidemia, unspecified: Secondary | ICD-10-CM | POA: Diagnosis present

## 2016-04-11 DIAGNOSIS — E1159 Type 2 diabetes mellitus with other circulatory complications: Secondary | ICD-10-CM | POA: Diagnosis present

## 2016-04-11 DIAGNOSIS — I10 Essential (primary) hypertension: Secondary | ICD-10-CM | POA: Diagnosis present

## 2016-04-11 DIAGNOSIS — R7989 Other specified abnormal findings of blood chemistry: Secondary | ICD-10-CM | POA: Diagnosis present

## 2016-04-11 DIAGNOSIS — E039 Hypothyroidism, unspecified: Secondary | ICD-10-CM | POA: Diagnosis not present

## 2016-04-11 DIAGNOSIS — R778 Other specified abnormalities of plasma proteins: Secondary | ICD-10-CM

## 2016-04-11 DIAGNOSIS — E1151 Type 2 diabetes mellitus with diabetic peripheral angiopathy without gangrene: Secondary | ICD-10-CM | POA: Diagnosis not present

## 2016-04-11 DIAGNOSIS — E119 Type 2 diabetes mellitus without complications: Secondary | ICD-10-CM | POA: Diagnosis present

## 2016-04-11 DIAGNOSIS — G4730 Sleep apnea, unspecified: Secondary | ICD-10-CM | POA: Diagnosis present

## 2016-04-11 DIAGNOSIS — Z7982 Long term (current) use of aspirin: Secondary | ICD-10-CM | POA: Diagnosis not present

## 2016-04-11 DIAGNOSIS — J189 Pneumonia, unspecified organism: Principal | ICD-10-CM | POA: Diagnosis present

## 2016-04-11 DIAGNOSIS — I739 Peripheral vascular disease, unspecified: Secondary | ICD-10-CM | POA: Diagnosis present

## 2016-04-11 DIAGNOSIS — G2581 Restless legs syndrome: Secondary | ICD-10-CM | POA: Diagnosis present

## 2016-04-11 HISTORY — DX: Restless legs syndrome: G25.81

## 2016-04-11 HISTORY — DX: Other specified abnormal findings of blood chemistry: R79.89

## 2016-04-11 HISTORY — DX: Other specified abnormalities of plasma proteins: R77.8

## 2016-04-11 LAB — CBC WITH DIFFERENTIAL/PLATELET
Basophils Absolute: 0 10*3/uL (ref 0.0–0.1)
Basophils Relative: 0 %
Eosinophils Absolute: 0 10*3/uL (ref 0.0–0.7)
Eosinophils Relative: 0 %
HCT: 37.9 % — ABNORMAL LOW (ref 39.0–52.0)
Hemoglobin: 12.6 g/dL — ABNORMAL LOW (ref 13.0–17.0)
Lymphocytes Relative: 5 %
Lymphs Abs: 0.6 10*3/uL — ABNORMAL LOW (ref 0.7–4.0)
MCH: 32.6 pg (ref 26.0–34.0)
MCHC: 33.2 g/dL (ref 30.0–36.0)
MCV: 97.9 fL (ref 78.0–100.0)
Monocytes Absolute: 0.9 10*3/uL (ref 0.1–1.0)
Monocytes Relative: 7 %
Neutro Abs: 11 10*3/uL — ABNORMAL HIGH (ref 1.7–7.7)
Neutrophils Relative %: 88 %
Platelets: 241 10*3/uL (ref 150–400)
RBC: 3.87 MIL/uL — ABNORMAL LOW (ref 4.22–5.81)
RDW: 13.6 % (ref 11.5–15.5)
WBC: 12.5 10*3/uL — ABNORMAL HIGH (ref 4.0–10.5)

## 2016-04-11 LAB — BASIC METABOLIC PANEL
Anion gap: 5 (ref 5–15)
BUN: 14 mg/dL (ref 6–20)
CO2: 26 mmol/L (ref 22–32)
Calcium: 8.3 mg/dL — ABNORMAL LOW (ref 8.9–10.3)
Chloride: 103 mmol/L (ref 101–111)
Creatinine, Ser: 0.76 mg/dL (ref 0.61–1.24)
GFR calc Af Amer: >60 mL/min (ref >60)
GFR calc non Af Amer: >60 mL/min (ref >60)
Glucose, Bld: 118 mg/dL — ABNORMAL HIGH (ref 65–99)
Potassium: 3.6 mmol/L (ref 3.5–5.1)
Sodium: 134 mmol/L — ABNORMAL LOW (ref 135–145)

## 2016-04-11 LAB — GLUCOSE, CAPILLARY: Glucose-Capillary: 155 mg/dL — ABNORMAL HIGH (ref 65–99)

## 2016-04-11 LAB — TROPONIN I: Troponin I: 0.04 ng/mL (ref <0.03)

## 2016-04-11 LAB — I-STAT CG4 LACTIC ACID, ED: Lactic Acid, Venous: 0.83 mmol/L (ref 0.5–1.9)

## 2016-04-11 MED ORDER — LEVOTHYROXINE SODIUM 50 MCG PO TABS
50.0000 ug | ORAL_TABLET | Freq: Every day | ORAL | Status: DC
  Administered 2016-04-12 – 2016-04-13 (×2): 50 ug via ORAL
  Filled 2016-04-11 (×2): qty 1

## 2016-04-11 MED ORDER — INSULIN ASPART 100 UNIT/ML ~~LOC~~ SOLN
0.0000 [IU] | Freq: Three times a day (TID) | SUBCUTANEOUS | Status: DC
Start: 2016-04-12 — End: 2016-04-13
  Administered 2016-04-12 (×3): 2 [IU] via SUBCUTANEOUS

## 2016-04-11 MED ORDER — GABAPENTIN 600 MG PO TABS
600.0000 mg | ORAL_TABLET | Freq: Four times a day (QID) | ORAL | Status: DC
  Administered 2016-04-11 (×2): 600 mg via ORAL
  Filled 2016-04-11 (×4): qty 1

## 2016-04-11 MED ORDER — DOXYCYCLINE HYCLATE 100 MG PO TABS
100.0000 mg | ORAL_TABLET | Freq: Two times a day (BID) | ORAL | Status: DC
  Administered 2016-04-11 – 2016-04-13 (×4): 100 mg via ORAL
  Filled 2016-04-11 (×4): qty 1

## 2016-04-11 MED ORDER — ENOXAPARIN SODIUM 40 MG/0.4ML ~~LOC~~ SOLN
40.0000 mg | SUBCUTANEOUS | Status: DC
  Administered 2016-04-11 – 2016-04-12 (×2): 40 mg via SUBCUTANEOUS
  Filled 2016-04-11 (×2): qty 0.4

## 2016-04-11 MED ORDER — DEXTROSE 5 % IV SOLN
1.0000 g | Freq: Once | INTRAVENOUS | Status: AC
  Administered 2016-04-11: 1 g via INTRAVENOUS
  Filled 2016-04-11: qty 10

## 2016-04-11 MED ORDER — AMLODIPINE BESYLATE 5 MG PO TABS
10.0000 mg | ORAL_TABLET | Freq: Every day | ORAL | Status: DC
  Administered 2016-04-12 – 2016-04-13 (×2): 10 mg via ORAL
  Filled 2016-04-11 (×2): qty 2

## 2016-04-11 MED ORDER — GABAPENTIN 300 MG PO CAPS
ORAL_CAPSULE | ORAL | Status: AC
  Filled 2016-04-11: qty 4

## 2016-04-11 MED ORDER — ASPIRIN EC 325 MG PO TBEC
325.0000 mg | DELAYED_RELEASE_TABLET | Freq: Every day | ORAL | Status: DC
  Administered 2016-04-12 – 2016-04-13 (×2): 325 mg via ORAL
  Filled 2016-04-11 (×2): qty 1

## 2016-04-11 MED ORDER — PRAVASTATIN SODIUM 40 MG PO TABS
40.0000 mg | ORAL_TABLET | Freq: Every day | ORAL | Status: DC
  Administered 2016-04-12 – 2016-04-13 (×2): 40 mg via ORAL
  Filled 2016-04-11 (×2): qty 1

## 2016-04-11 MED ORDER — BISACODYL 5 MG PO TBEC: 5.0000 mg | DELAYED_RELEASE_TABLET | Freq: Every day | ORAL | Status: DC | PRN

## 2016-04-11 MED ORDER — AZITHROMYCIN 500 MG IV SOLR
500.0000 mg | Freq: Once | INTRAVENOUS | Status: AC
  Administered 2016-04-11: 500 mg via INTRAVENOUS
  Filled 2016-04-11: qty 500

## 2016-04-11 MED ORDER — ONDANSETRON HCL 4 MG/2ML IJ SOLN
4.0000 mg | Freq: Once | INTRAMUSCULAR | Status: AC
  Administered 2016-04-11: 4 mg via INTRAVENOUS
  Filled 2016-04-11: qty 2

## 2016-04-11 MED ORDER — ROPINIROLE HCL 1 MG PO TABS
0.5000 mg | ORAL_TABLET | Freq: Every day | ORAL | Status: DC
  Administered 2016-04-11 – 2016-04-12 (×2): 0.5 mg via ORAL
  Filled 2016-04-11 (×2): qty 1

## 2016-04-11 MED ORDER — DULOXETINE HCL 20 MG PO CPEP
20.0000 mg | ORAL_CAPSULE | Freq: Two times a day (BID) | ORAL | Status: DC
  Administered 2016-04-11 – 2016-04-13 (×4): 20 mg via ORAL
  Filled 2016-04-11 (×6): qty 1

## 2016-04-11 MED ORDER — SACCHAROMYCES BOULARDII 250 MG PO CAPS
250.0000 mg | ORAL_CAPSULE | Freq: Two times a day (BID) | ORAL | Status: DC
  Administered 2016-04-11 – 2016-04-12 (×3): 250 mg via ORAL
  Filled 2016-04-11 (×3): qty 1

## 2016-04-11 MED ORDER — CLOPIDOGREL BISULFATE 75 MG PO TABS
75.0000 mg | ORAL_TABLET | Freq: Every day | ORAL | Status: DC
  Administered 2016-04-12 – 2016-04-13 (×2): 75 mg via ORAL
  Filled 2016-04-11 (×2): qty 1

## 2016-04-11 MED ORDER — HYDROMORPHONE HCL 1 MG/ML IJ SOLN
1.0000 mg | Freq: Once | INTRAMUSCULAR | Status: DC
  Filled 2016-04-11: qty 1

## 2016-04-11 MED ORDER — HYDROMORPHONE HCL 2 MG PO TABS: 2.0000 mg | ORAL_TABLET | ORAL | Status: DC | PRN

## 2016-04-11 MED ORDER — DEXTROSE 5 % IV SOLN
2.0000 g | INTRAVENOUS | Status: DC
  Administered 2016-04-12 – 2016-04-13 (×2): 2 g via INTRAVENOUS
  Filled 2016-04-11 (×3): qty 2

## 2016-04-11 MED ORDER — POLYETHYLENE GLYCOL 3350 17 G PO PACK
17.0000 g | PACK | Freq: Every day | ORAL | Status: DC
  Administered 2016-04-11 – 2016-04-13 (×3): 17 g via ORAL
  Filled 2016-04-11 (×3): qty 1

## 2016-04-11 NOTE — ED Provider Notes (Signed)
[place] DEPT Provider Note   CSN: [id number] Arrival date & time: 04/11/16  1131  First Provider Contact:  12:43 PM  By signing my name below, I, Evelene Croon, attest that this documentation has been prepared under the direction and in the presence of Daleen Bo, MD . Electronically Signed: Evelene Croon, Scribe. 04/11/2016. 12:52 PM.  History   Chief Complaint Chief Complaint  Patient presents with  . Shortness of Breath    The history is provided by the patient and a relative. No language interpreter was used.    HPI Comments:  COEN GROSZ is a 70 y.o. male with a history of DM, HLD, HTN, who presents to the Emergency Department complaining of persistent SOB since yesterday with associated productive cough. He notes his SOB is worse when supine. Family reports generalized weakness. Per family pt appeared gray this AM but his skin color improved  after he was placed on oxygen by EMS. Pt denies fever. Pt recently had surgery on his RLE at [place].    Past Medical History:  Diagnosis Date  . Arthritis   . Carotid artery occlusion   . Diabetes mellitus   . Hyperlipidemia   . Hypertension   . Hypothyroidism   . Obstructive sleep apnea   . Peripheral arterial disease (HCC)    nonhealing ulcers bilaterally on each great toe  . PONV (postoperative nausea and vomiting)   . Shortness of breath   . Stroke [facility]) March 08, 2012    Patient Active Problem List   Diagnosis Date Noted  . Nonhealing skin ulcer ([place]) 10/28/2014  . PAD (peripheral artery disease) ([place]) 10/28/2014  . Bilateral carotid artery disease ([place]) 07/08/2014  . Critical lower limb ischemia 02/28/2014  . Obstructive sleep apnea 02/12/2014  . Aftercare following surgery of the circulatory system, [place] 12/04/2013  . Carotid stenosis 11/29/2013  . Restless leg syndrome 10/23/2013  . Sleep apnea 10/23/2013  . Obesity (BMI 30.0-34.9) 10/23/2013  . Hyperlipidemia 07/16/2013  . Peripheral arterial  disease ([place]) 07/16/2013  . Occlusion and stenosis of carotid artery without mention of cerebral infarction 04/24/2012  . History of stroke June 2013 03/14/2012  . Hypertension 03/14/2012  . Type 2 diabetes mellitus with circulatory disorder ([place]) 03/14/2012  . Peripheral neuropathy ([place]) 03/14/2012  . Cardiomyopathy- EF NL 5/14 03/14/2012  . ARTHRITIS, RIGHT FOOT 06/26/2008    Past Surgical History:  Procedure Laterality Date  . ANGIOPLASTY  02/28/14   diamond back orbital rotational atherectomy of Rt. tibial  . BACK SURGERY    . CAROTID ENDARTERECTOMY Left   . CERVICAL FUSION    . ENDARTERECTOMY Left 11/29/2013   Procedure: ENDARTERECTOMY CAROTID;  Surgeon: Serafina Mitchell, MD;  Location: [place] [facility];  Service: Vascular;  Laterality: Left;  . EYE SURGERY    . FOOT SURGERY    . Lower ext duplex doppler  03/14/14   Rt ABI 1.2  . LOWER EXTREMITY ANGIOGRAM Bilateral 02/18/2014   Procedure: LOWER EXTREMITY ANGIOGRAM;  Surgeon: [name], MD;  Location: [facility] CATH LAB;  Service: Cardiovascular;  Laterality: Bilateral;  . LOWER EXTREMITY ANGIOGRAM N/A 10/31/2014   Procedure: LOWER EXTREMITY ANGIOGRAM;  Surgeon: [name], MD;  Location: [facility] CATH LAB;  Service: Cardiovascular;  Laterality: N/A;  . PV angiogram  02/18/2014   tibial vessel diseas bil.  [name] SPINE SURGERY    . tendon achillies lengthing and sesamoid         Home Medications    Prior to Admission medications  Medication Sig Start Date End Date Taking? Authorizing Provider  amLODipine (NORVASC) 10 MG tablet Take 10 mg by mouth daily.  05/28/14   Historical Provider, MD  aspirin EC 81 MG tablet Take 1 tablet (81 mg total) by mouth daily. Patient taking differently: Take 325 mg by mouth daily.  11/26/14   Erlene Quan, PA-C  b complex vitamins tablet Take 1 tablet by mouth daily.    Historical Provider, MD  clopidogrel (PLAVIX) 75 MG tablet Take 75 mg by mouth daily.  06/16/13   Historical Provider, MD  DULoxetine (CYMBALTA) 20  MG capsule Take 1 capsule by mouth 2 (two) times daily. 07/30/14   Historical Provider, MD  Empagliflozin-Linagliptin 10-5 MG TABS Take 1 tablet by mouth daily.    Historical Provider, MD  gabapentin (NEURONTIN) 600 MG tablet Take 600 mg by mouth 4 (four) times daily.     Historical Provider, MD  levothyroxine (SYNTHROID, LEVOTHROID) 25 MCG tablet Take 50 mcg by mouth daily.  06/08/13   Historical Provider, MD  Omega 3-6-9 Fatty Acids (OMEGA 3-6-9 COMPLEX) CAPS Take 1 capsule by mouth 2 (two) times daily.    Historical Provider, MD  pravastatin (PRAVACHOL) 40 MG tablet Take 40 mg by mouth every morning.  10/06/12   Historical Provider, MD  rOPINIRole (REQUIP) 0.5 MG tablet Take 1 tablet (0.5 mg total) by mouth at bedtime. 10/29/13   Erlene Quan, PA-C  saccharomyces boulardii (FLORASTOR) 250 MG capsule Take 1 capsule (250 mg total) by mouth 2 (two) times daily. 11/01/14   Janece Canterbury, MD  sitaGLIPtin (JANUVIA) 100 MG tablet Take 100 mg by mouth daily.    Historical Provider, MD    Family History Family History  Problem Relation Age of Onset  . Heart disease Mother   . Hypertension Mother   . Heart attack Mother   . Hypertension Father   . Diabetes Father   . Diabetes Son   . Heart disease Son   . Hypertension Son     Social History Social History  Substance Use Topics  . Smoking status: Former Smoker    Years: 1.00    Types: Pipe    Quit date: 07/08/1977  . Smokeless tobacco: Never Used  . Alcohol use No     Allergies   Codeine and Tramadol   Review of Systems Review of Systems  Constitutional: Negative for fever.  Respiratory: Positive for cough and shortness of breath.   Neurological: Positive for weakness (generalized).  All other systems reviewed and are negative.    Physical Exam Updated Vital Signs BP 163/85   Pulse 89   Temp 98.9 F (37.2 C)   Resp 20   Ht 6' (1.829 m)   Wt 245 lb (111.1 kg)   SpO2 96%   BMI 33.23 kg/m   Physical Exam    Constitutional: He is oriented to person, place, and time. He appears well-developed and well-nourished.  HENT:  Head: Normocephalic and atraumatic.  Right Ear: External ear normal.  Left Ear: External ear normal.  Eyes: Conjunctivae and EOM are normal. Pupils are equal, round, and reactive to light.  Neck: Normal range of motion and phonation normal. Neck supple.  Cardiovascular: Normal rate, regular rhythm and normal heart sounds.   Pulmonary/Chest: He has no wheezes. He has rhonchi (generalized). He has no rales. He exhibits no bony tenderness.  Abdominal: Soft. There is no tenderness.  Musculoskeletal: He exhibits edema.  RLE in long leg splint from knee to foot  Left lower leg 1+ edema   Neurological: He is alert and oriented to person, place, and time. No cranial nerve deficit or sensory deficit. He exhibits normal muscle tone. Coordination normal.  Skin: Skin is warm, dry and intact.  Psychiatric: He has a normal mood and affect. His behavior is normal. Judgment and thought content normal.  Nursing note and vitals reviewed.    ED Treatments / Results  DIAGNOSTIC STUDIES:  Oxygen Saturation is 96% on RA, normal by my interpretation.    COORDINATION OF CARE:  12:48 PM Discussed treatment plan with pt at bedside and pt agreed to plan.  Labs (all labs ordered are listed, but only abnormal results are displayed) Labs Reviewed  CBC WITH DIFFERENTIAL/PLATELET  BASIC METABOLIC PANEL  TROPONIN I    EKG  EKG Interpretation None       Radiology Dg Chest 2 View  Result Date: 04/11/2016 CLINICAL DATA:  Shortness of breath, weakness EXAM: CHEST  2 VIEW COMPARISON:  01/29/2014 FINDINGS: Mild patchy opacities in the right upper lobe and right infrahilar region, suspicious for pneumonia, less likely asymmetric interstitial edema. Associated trace bilateral pleural effusions. No pneumothorax. Cardiomegaly. Degenerative changes of the visualized thoracolumbar spine. IMPRESSION:  Mild patchy opacities in the right upper and lower lobes, suspicious for pneumonia, less likely asymmetric interstitial edema. Associated trace bilateral pleural effusions. Electronically Signed   By: Julian Hy M.D.   On: 04/11/2016 12:37   Procedures Procedures (including critical care time)  Medications Ordered in ED Medications - No data to display   Initial Impression / Assessment and Plan / ED Course  I have reviewed the triage vital signs and the nursing notes.  Pertinent labs & imaging results that were available during my care of the patient were reviewed by me and considered in my medical decision making (see chart for details).  Clinical Course    Medications  cefTRIAXone (ROCEPHIN) 1 g in dextrose 5 % 50 mL IVPB (0 g Intravenous Stopped 04/11/16 1648)  azithromycin (ZITHROMAX) 500 mg in dextrose 5 % 250 mL IVPB (0 mg Intravenous Stopped 04/11/16 1741)  ondansetron (ZOFRAN) injection 4 mg (4 mg Intravenous Given 04/11/16 1737)    No data found.      Final Clinical Impressions(s) / ED Diagnoses   Final diagnoses:  None   Diagnoses that have been ruled out:  None  Diagnoses that are still under consideration:  None  Final diagnoses:  CAP (community acquired pneumonia)  Slow transit constipation     Nursing Notes Reviewed/ Care Coordinated, and agree without changes. Applicable Imaging Reviewed.  Interpretation of Laboratory Data incorporated into ED treatment   Plan: Admit   New Prescriptions New Prescriptions   No medications on file  I personally performed the services described in this documentation, which was scribed in my presence. The recorded information has been reviewed and is accurate.     Daleen Bo, MD 04/21/16 [phone]

## 2016-04-11 NOTE — Progress Notes (Signed)
FYI per pt family.  Dr Cleotilde Neer [phone]) at [facility] performed procedure on right foot/ankle.

## 2016-04-11 NOTE — H&P (Signed)
History and Physical    [name] [id number] DOB: 1946/04/28 DOA: 04/11/2016  PCP: [name], MD Consultants:  Prudy Feeler ([place]) - Robert Bellow - cardiology; [place] - vascular Patient coming from: home - lives with wife  Chief Complaint: SOB  HPI: [name] is a 70 y.o. male with medical history significant of DM, HTN, HLD, PVD with recent foot surgery and new SOB.  Patient had surgery on his right ankle on Friday (7/21).  Has an ulcer and it was operated on and tendon lengthening and the doctor also shaved some of bone.  Yesterday, developed SOB upon awakening and throughout the day.  SOB worsened today and he finally agreed to come to ER.  +cough - yesterday and today.  Productive cough of yellow-green sputum.  No fevers.  No sick contacts.  +nasal congestion, no rhinorrhea.  +chest pain today, lower chest around diaphragm.  +constipation (on Dilaudid for pain).   ED Course:  SOB and positive CXR so treated with Rocephin/Azithromycin for CAP.  Within 10 minutes of onset of infusion of Azithromycin (60 minute infusion time), developed n/v and infusion was stopped and patient given Zofran.  Review of Systems: As per HPI; otherwise 10 point review of systems reviewed and negative.   Ambulatory Status:  Ambulated with cane prior to surgery; now non-weight bearing x 2 weeks  Past Medical History:  Diagnosis Date  . Arthritis   . Carotid artery occlusion    left s/p CEA  . Diabetes mellitus   . Hypertension   . Hypothyroidism   . Obstructive sleep apnea   . Peripheral arterial disease (HCC)    nonhealing ulcers bilaterally on each great toe  . PONV (postoperative nausea and vomiting)   . Restless leg syndrome   . Shortness of breath   . Stroke [facility]) March 08, 2012    Past Surgical History:  Procedure Laterality Date  . ANGIOPLASTY  02/28/14   diamond back orbital rotational atherectomy of Rt. tibial  . BACK SURGERY    . CERVICAL FUSION    . ENDARTERECTOMY Left  11/29/2013   Procedure: ENDARTERECTOMY CAROTID;  Surgeon: Serafina Mitchell, MD;  Location: [place];  Service: Vascular;  Laterality: Left;  . EYE SURGERY    . FOOT SURGERY    . Lower ext duplex doppler  03/14/14   Rt ABI 1.2  . LOWER EXTREMITY ANGIOGRAM Bilateral 02/18/2014   Procedure: LOWER EXTREMITY ANGIOGRAM;  Surgeon: [name], MD;  Location[facility] C[facility] LAB;  Service: Cardiovascular;  Laterality: Bilateral;  . LOWER EXTREMITY ANGIOGRAM N/A 10/31/2014   Procedure: LOWER EXTREMITY ANGIOGRAM;  Surgeon: [name], MD;  Location: [facility] CATH LAB;  Service: Cardiovascular;  Laterality: N/A;  . PV angiogram  02/18/2014   tibial vessel diseas bil.  [name] SPINE SURGERY    . tendon achillies lengthing and sesamoid      Social History   Social History  . Marital status: Married    Spouse name: N/A  . Number of children: N/A  . Years of education: N/A   Occupational History  . Not on file.   Social History Main Topics  . Smoking status: Former Smoker    Years: 1.00    Types: Pipe    Quit date: 07/08/1977  . Smokeless tobacco: Never Used  . Alcohol use No  . Drug use: No  . Sexual activity: Not on file   Other Topics Concern  . Not on file   Social History Narrative  .  No narrative on file    Allergies  Allergen Reactions  . Codeine Nausea And Vomiting  . Tramadol Nausea Only    Family History  Problem Relation Age of Onset  . Heart disease Mother   . Hypertension Mother   . Heart attack Mother   . Hypertension Father   . Diabetes Father   . Diabetes Son   . Heart disease Son   . Hypertension Son     Prior to Admission medications   Medication Sig Start Date End Date Taking? Authorizing Provider  amLODipine (NORVASC) 10 MG tablet Take 10 mg by mouth daily.  05/28/14  Yes Historical Provider, MD  aspirin EC 81 MG tablet Take 1 tablet (81 mg total) by mouth daily. Patient taking differently: Take 325 mg by mouth daily.  11/26/14  Yes Erlene Quan, PA-C  b complex  vitamins tablet Take 1 tablet by mouth daily.   Yes Historical Provider, MD  clopidogrel (PLAVIX) 75 MG tablet Take 75 mg by mouth daily.  06/16/13  Yes Historical Provider, MD  DULoxetine (CYMBALTA) 20 MG capsule Take 1 capsule by mouth 2 (two) times daily. 07/30/14  Yes Historical Provider, MD  Empagliflozin-Linagliptin 10-5 MG TABS Take 1 tablet by mouth daily.   Yes Historical Provider, MD  gabapentin (NEURONTIN) 600 MG tablet Take 600 mg by mouth 4 (four) times daily.    Yes Historical Provider, MD  glimepiride (AMARYL) 4 MG tablet Take 4 mg by mouth daily with breakfast.   Yes Historical Provider, MD  HYDROmorphone (DILAUDID) 2 MG tablet Take 1 tablet by mouth every 4 (four) hours as needed for pain. 04/09/16  Yes Historical Provider, MD  levothyroxine (SYNTHROID, LEVOTHROID) 25 MCG tablet Take 50 mcg by mouth daily.  06/08/13  Yes Historical Provider, MD  Omega 3-6-9 Fatty Acids (OMEGA 3-6-9 COMPLEX) CAPS Take 1 capsule by mouth 2 (two) times daily.   Yes Historical Provider, MD  pravastatin (PRAVACHOL) 40 MG tablet Take 40 mg by mouth every morning.  10/06/12  Yes Historical Provider, MD  rOPINIRole (REQUIP) 0.5 MG tablet Take 1 tablet (0.5 mg total) by mouth at bedtime. 10/29/13  Yes Luke K Kilroy, PA-C  saccharomyces boulardii (FLORASTOR) 250 MG capsule Take 1 capsule (250 mg total) by mouth 2 (two) times daily. 11/01/14  Yes Janece Canterbury, MD    Physical Exam: Vitals:   04/11/16 1800 04/11/16 1816 04/11/16 1820 04/11/16 1900  BP: 139/76 (!) 163/101 (!) 168/90   Pulse: 99 99    Resp: 23 20    Temp:  98.3 F (36.8 C)    TempSrc:  Oral    SpO2: 96% 98%  92%  Weight:  109.8 kg (242 lb)    Height:  6' (1.829 m)       General: Appears calm and comfortable and is NAD Eyes:  PERRL, EOMI, normal lids, iris ENT:  grossly normal hearing, lips & tongue, mmm Neck:  no LAD, masses or thyromegaly Cardiovascular:  RRR, no m/r/g. No LE edema.  Respiratory:  CTA bilaterally, no w/r/r. Normal  respiratory effort. Abdomen:  soft, ntnd, NABS Skin:  no rash or induration seen on limited exam Musculoskeletal:  RLE in splint from toes to knee; splint is then floating on cushion. Scant LLE edema. Psychiatric:  grossly normal mood and affect, speech fluent and appropriate, AOx3 Neurologic:  CN 2-12 grossly intact, moves all extremities in coordinated fashion, sensation intact  Labs on Admission: I have personally reviewed following labs and imaging studies  CBC:  Recent Labs Lab 04/11/16 1302  WBC 12.5*  NEUTROABS 11.0*  HGB 12.6*  HCT 37.9*  MCV 97.9  PLT [id number]   Basic Metabolic Panel:  Recent Labs Lab 04/11/16 1302  NA 134*  K 3.6  CL 103  CO2 26  GLUCOSE 118*  BUN 14  CREATININE 0.76  CALCIUM 8.3*   GFR: Estimated Creatinine Clearance: 106.8 mL/min (by C-G formula based on SCr of 0.8 mg/dL). Liver Function Tests: No results for input(s): AST, ALT, ALKPHOS, BILITOT, PROT, ALBUMIN in the last 168 hours. No results for input(s): LIPASE, AMYLASE in the last 168 hours. No results for input(s): AMMONIA in the last 168 hours. Coagulation Profile: No results for input(s): INR, PROTIME in the last 168 hours. Cardiac Enzymes:  Recent Labs Lab 04/11/16 1302  TROPONINI 0.04*   BNP (last 3 results) No results for input(s): PROBNP in the last 8760 hours. HbA1C: No results for input(s): HGBA1C in the last 72 hours. CBG: No results for input(s): GLUCAP in the last 168 hours. Lipid Profile: No results for input(s): CHOL, HDL, LDLCALC, TRIG, CHOLHDL, LDLDIRECT in the last 72 hours. Thyroid Function Tests: No results for input(s): TSH, T4TOTAL, FREET4, T3FREE, THYROIDAB in the last 72 hours. Anemia Panel: No results for input(s): VITAMINB12, FOLATE, FERRITIN, TIBC, IRON, RETICCTPCT in the last 72 hours. Urine analysis:    Component Value Date/Time   COLORURINE YELLOW 11/23/2013 1530   APPEARANCEUR CLEAR 11/23/2013 1530   LABSPEC 1.040 (H) 11/23/2013 1530    PHURINE 5.0 11/23/2013 1530   GLUCOSEU >1000 (A) 11/23/2013 1530   HGBUR NEGATIVE 11/23/2013 1530   BILIRUBINUR NEGATIVE 11/23/2013 1530   KETONESUR 15 (A) 11/23/2013 1530   PROTEINUR NEGATIVE 11/23/2013 1530   UROBILINOGEN 0.2 11/23/2013 1530   NITRITE NEGATIVE 11/23/2013 1530   LEUKOCYTESUR NEGATIVE 11/23/2013 1530    Creatinine Clearance: Estimated Creatinine Clearance: 106.8 mL/min (by C-G formula based on SCr of 0.8 mg/dL).  Sepsis Labs: @LABRCNTIP (procalcitonin:4,lacticidven:4) )No results found for this or any previous visit (from the past 240 hour(s)).   Radiological Exams on Admission: Dg Chest 2 View  Result Date: 04/11/2016 CLINICAL DATA:  Shortness of breath, weakness EXAM: CHEST  2 VIEW COMPARISON:  01/29/2014 FINDINGS: Mild patchy opacities in the right upper lobe and right infrahilar region, suspicious for pneumonia, less likely asymmetric interstitial edema. Associated trace bilateral pleural effusions. No pneumothorax. Cardiomegaly. Degenerative changes of the visualized thoracolumbar spine. IMPRESSION: Mild patchy opacities in the right upper and lower lobes, suspicious for pneumonia, less likely asymmetric interstitial edema. Associated trace bilateral pleural effusions. Electronically Signed   By: Julian Hy M.D.   On: 04/11/2016 12:37   EKG: Unable to review in EPIC following upgrade.  Per Dr. Eulis Foster - NSR with rate 89; nonspecific ST changes with no evidence of acute ischemia  Assessment/Plan Principal Problem:   CAP (community acquired pneumonia) Active Problems:   Hypertension   Type 2 diabetes mellitus with circulatory disorder (HCC)   Hyperlipidemia   Peripheral arterial disease (HCC)   Restless leg syndrome   Obstructive sleep apnea   Elevated troponin    CAP -R-sided CAP as per history and CXR -Will start treatment as per algorithm -Given patient's vomiting at the start of Azithromycin infusion, will treat with Rocephin and  Doxycycline -Will admit to observation status, Dr. Luan Pulling as attending in AM -Check Legionella and S. Pneumoniae antigens -No current O2 requirement -Given his very recent surgery, would have low threshold for CTA to r/o PE if patient is  not improving and/or decompensating -This has been explained to patient and family and they are in agreement with the plan  PAD -Patient with surgery 2 days PTA -Non weight-bearing for 2 weeks -No additional intervention needed at this time -Will continue PO Dilaudid as per home dosing (this is his only medication listed in the  Controlled Substances databank in the last 6 months) -Continue ASA and Plavix  HTN -Continue Norvasc  RLS -Poorly controlled per patient report -On low-dose Requip -Continue current dosing for now -Consider increasing dosage either as inpatient or outpatient  OSA -Continue CPAP  Elevated troponin -Mildly increased -Likely demand ischemia but will trend troponins and repeat EKG in AM  DVT prophylaxis: Lovenox Code Status:  Full - confirmed with patient/family Family Communication: Daughter present throughout evaluation  Disposition Plan:  Home once clinically improved Consults called: None Admission status: Observation to Med-Surg    Karmen Bongo MD [facility]  If 7PM-7AM, please contact night-coverage [email or web address] Password [facility]  04/11/2016, 8:35 PM

## 2016-04-11 NOTE — ED Notes (Signed)
Attempted to call report, assuming RN unable at this time.

## 2016-04-11 NOTE — ED Notes (Signed)
After Zofran given, patient's oxygen saturation decreased to 78% - O2 applied via nasal canula @ 2lpm, patient encouraged to take deep breaths, oxygen saturation increased to 96% after O2 applied. Pt states he feels better.

## 2016-04-11 NOTE — ED Triage Notes (Signed)
Patient c/o shortness of breath that started this morning at 9:30 when he had sudden urge to cough. Patient states "I coughed up some thick stuff and after that it was like I couldn't catch my breath." Patient does report productive cough since. Per patient chest pain only with deep breath. Denies any fevers. Denies any hx of CHF or COPD. Patient recently had surgery to right foot Friday on right leg. Patient does take aspirin and Plavix.

## 2016-04-11 NOTE — ED Provider Notes (Signed)
[facility] DEPT Provider Note   CSN: [id number] Arrival date & time: 04/11/16  1131  First Provider Contact:  None       History   Chief Complaint Chief Complaint  Patient presents with  . Shortness of Breath    HPI [name] is a 70 y.o. male.  He presents for evaluation of shortness of breath which started yesterday and got worse today. He has a mild cough which is only minimally productive. He recently had "tendon surgery" on his right ankle, and is recovering at home. He denies fevers, nausea, vomiting, dysuria or urinary frequency. He has not had a bowel movement in a few days, and feels like his abdomen is distended. He is taking his usual medications, without relief. He was not hospitalized at the time of surgery. He has not had any other hospitalizations in the last 3 months.   HPI  Past Medical History:  Diagnosis Date  . Arthritis   . Carotid artery occlusion   . Diabetes mellitus   . Hyperlipidemia   . Hypertension   . Hypothyroidism   . Obstructive sleep apnea   . Peripheral arterial disease (HCC)    nonhealing ulcers bilaterally on each great toe  . PONV (postoperative nausea and vomiting)   . Shortness of breath   . Stroke [facility]) March 08, 2012    Patient Active Problem List   Diagnosis Date Noted  . Nonhealing skin ulcer ([place]) 10/28/2014  . PAD (peripheral artery disease) ([place]) 10/28/2014  . Bilateral carotid artery disease ([place]) 07/08/2014  . Critical lower limb ischemia 02/28/2014  . Obstructive sleep apnea 02/12/2014  . Aftercare following surgery of the circulatory system, [place] 12/04/2013  . Carotid stenosis 11/29/2013  . Restless leg syndrome 10/23/2013  . Sleep apnea 10/23/2013  . Obesity (BMI 30.0-34.9) 10/23/2013  . Hyperlipidemia 07/16/2013  . Peripheral arterial disease ([place]) 07/16/2013  . Occlusion and stenosis of carotid artery without mention of cerebral infarction 04/24/2012  . History of stroke June 2013 03/14/2012  .  Hypertension 03/14/2012  . Type 2 diabetes mellitus with circulatory disorder ([place]) 03/14/2012  . Peripheral neuropathy ([place]) 03/14/2012  . Cardiomyopathy- EF NL 5/14 03/14/2012  . ARTHRITIS, RIGHT FOOT 06/26/2008    Past Surgical History:  Procedure Laterality Date  . ANGIOPLASTY  02/28/14   diamond back orbital rotational atherectomy of Rt. tibial  . BACK SURGERY    . CAROTID ENDARTERECTOMY Left   . CERVICAL FUSION    . ENDARTERECTOMY Left 11/29/2013   Procedure: ENDARTERECTOMY CAROTID;  Surgeon: Serafina Mitchell, MD;  Location: [facility];  Service: Vascular;  Laterality: Left;  . EYE SURGERY    . FOOT SURGERY    . Lower ext duplex doppler  03/14/14   Rt ABI 1.2  . LOWER EXTREMITY ANGIOGRAM Bilateral 02/18/2014   Procedure: LOWER EXTREMITY ANGIOGRAM;  Surgeon: [name], MD;  Location: [facility] CATH LAB;  Service: Cardiovascular;  Laterality: Bilateral;  . LOWER EXTREMITY ANGIOGRAM N/A 10/31/2014   Procedure: LOWER EXTREMITY ANGIOGRAM;  Surgeon: [name], MD;  Location: [facility] CATH LAB;  Service: Cardiovascular;  Laterality: N/A;  . PV angiogram  02/18/2014   tibial vessel diseas bil.  [name] SPINE SURGERY    . tendon achillies lengthing and sesamoid         Home Medications    Prior to Admission medications   Medication Sig Start Date End Date Taking? Authorizing Provider  amLODipine (NORVASC) 10 MG tablet Take 10 mg by mouth daily.  05/28/14  Yes Historical Provider, MD  aspirin EC 81 MG tablet Take 1 tablet (81 mg total) by mouth daily. Patient taking differently: Take 325 mg by mouth daily.  11/26/14  Yes Erlene Quan, PA-C  b complex vitamins tablet Take 1 tablet by mouth daily.   Yes Historical Provider, MD  clopidogrel (PLAVIX) 75 MG tablet Take 75 mg by mouth daily.  06/16/13  Yes Historical Provider, MD  DULoxetine (CYMBALTA) 20 MG capsule Take 1 capsule by mouth 2 (two) times daily. 07/30/14  Yes Historical Provider, MD  Empagliflozin-Linagliptin 10-5 MG TABS Take 1 tablet by  mouth daily.   Yes Historical Provider, MD  gabapentin (NEURONTIN) 600 MG tablet Take 600 mg by mouth 4 (four) times daily.    Yes Historical Provider, MD  glimepiride (AMARYL) 4 MG tablet Take 4 mg by mouth daily with breakfast.   Yes Historical Provider, MD  HYDROmorphone (DILAUDID) 2 MG tablet Take 1 tablet by mouth every 4 (four) hours as needed for pain. 04/09/16  Yes Historical Provider, MD  levothyroxine (SYNTHROID, LEVOTHROID) 25 MCG tablet Take 50 mcg by mouth daily.  06/08/13  Yes Historical Provider, MD  Omega 3-6-9 Fatty Acids (OMEGA 3-6-9 COMPLEX) CAPS Take 1 capsule by mouth 2 (two) times daily.   Yes Historical Provider, MD  pravastatin (PRAVACHOL) 40 MG tablet Take 40 mg by mouth every morning.  10/06/12  Yes Historical Provider, MD  rOPINIRole (REQUIP) 0.5 MG tablet Take 1 tablet (0.5 mg total) by mouth at bedtime. 10/29/13  Yes Luke K Kilroy, PA-C  saccharomyces boulardii (FLORASTOR) 250 MG capsule Take 1 capsule (250 mg total) by mouth 2 (two) times daily. 11/01/14  Yes Janece Canterbury, MD    Family History Family History  Problem Relation Age of Onset  . Heart disease Mother   . Hypertension Mother   . Heart attack Mother   . Hypertension Father   . Diabetes Father   . Diabetes Son   . Heart disease Son   . Hypertension Son     Social History Social History  Substance Use Topics  . Smoking status: Former Smoker    Years: 1.00    Types: Pipe    Quit date: 07/08/1977  . Smokeless tobacco: Never Used  . Alcohol use No     Allergies   Codeine and Tramadol   Review of Systems Review of Systems   Physical Exam Updated Vital Signs BP 145/85   Pulse 93   Temp 98.9 F (37.2 C)   Resp 20   Ht 6' (1.829 m)   Wt 245 lb (111.1 kg)   SpO2 95%   BMI 33.23 kg/m   Physical Exam   ED Treatments / Results  Labs (all labs ordered are listed, but only abnormal results are displayed) Labs Reviewed  CBC WITH DIFFERENTIAL/PLATELET - Abnormal; Notable for the  following:       Result Value   WBC 12.5 (*)    RBC 3.87 (*)    Hemoglobin 12.6 (*)    HCT 37.9 (*)    Neutro Abs 11.0 (*)    Lymphs Abs 0.6 (*)    All other components within normal limits  BASIC METABOLIC PANEL - Abnormal; Notable for the following:    Sodium 134 (*)    Glucose, Bld 118 (*)    Calcium 8.3 (*)    All other components within normal limits  TROPONIN I - Abnormal; Notable for the following:    Troponin I 0.04 (*)  All other components within normal limits  CULTURE, BLOOD (ROUTINE X 2)  CULTURE, BLOOD (ROUTINE X 2)  I-STAT CG4 LACTIC ACID, ED    EKG  EKG Interpretation  Date/Time:  Sunday April 11 2016 11:42:26 EDT Ventricular Rate:  89 PR Interval:  208 QRS Duration: 94 QT Interval:  370 QTC Calculation: 450 R Axis:   57 Text Interpretation:  Normal sinus rhythm Nonspecific T wave abnormality Abnormal ECG since last tracing no significant change Confirmed by Eulis Foster  MD, Sandor Arboleda [phone]) on 04/11/2016 4:28:59 PM       Radiology Dg Chest 2 View  Result Date: 04/11/2016 CLINICAL DATA:  Shortness of breath, weakness EXAM: CHEST  2 VIEW COMPARISON:  01/29/2014 FINDINGS: Mild patchy opacities in the right upper lobe and right infrahilar region, suspicious for pneumonia, less likely asymmetric interstitial edema. Associated trace bilateral pleural effusions. No pneumothorax. Cardiomegaly. Degenerative changes of the visualized thoracolumbar spine. IMPRESSION: Mild patchy opacities in the right upper and lower lobes, suspicious for pneumonia, less likely asymmetric interstitial edema. Associated trace bilateral pleural effusions. Electronically Signed   By: Julian Hy M.D.   On: 04/11/2016 12:37   Procedures Procedures (including critical care time)  Medications Ordered in ED Medications  azithromycin (ZITHROMAX) 500 mg in dextrose 5 % 250 mL IVPB (not administered)  cefTRIAXone (ROCEPHIN) 1 g in dextrose 5 % 50 mL IVPB (1 g Intravenous New Bag/Given  04/11/16 1554)     Initial Impression / Assessment and Plan / ED Course  I have reviewed the triage vital signs and the nursing notes.  Pertinent labs & imaging results that were available during my care of the patient were reviewed by me and considered in my medical decision making (see chart for details).  Clinical Course    Medications  azithromycin (ZITHROMAX) 500 mg in dextrose 5 % 250 mL IVPB (not administered)  cefTRIAXone (ROCEPHIN) 1 g in dextrose 5 % 50 mL IVPB (1 g Intravenous New Bag/Given 04/11/16 1554)    Patient Vitals for the past 24 hrs:  BP Temp Pulse Resp SpO2 Height Weight  04/11/16 1545 - - - 20 - - -  04/11/16 1530 145/85 - 93 18 95 % - -  04/11/16 1515 - - 93 18 94 % - -  04/11/16 1500 - - 93 25 94 % - -  04/11/16 1330 161/79 - 93 24 95 % - -  04/11/16 1315 - - 91 18 93 % - -  04/11/16 1300 161/93 - 93 19 94 % - -  04/11/16 1252 148/96 - 91 - 94 % - -  04/11/16 1251 - - 89 - 94 % - -  04/11/16 1146 163/85 98.9 F (37.2 C) 89 20 96 % 6' (1.829 m) 245 lb (111.1 kg)    4:22 PM Reevaluation with update and discussion. After initial assessment and treatment, an updated evaluation reveals He feels more comfortable and is able to take a deeper breath. After the fleets enema. He had a good bowel movement. Patient family updated on findings, and agree for admission, for a short period of  monitoring. Maurita Havener L   4:30 PM-Consult complete with Hospitalist. Patient case explained and discussed. She agrees to admit patient for further evaluation and treatment. Call ended at 16:40  Final Clinical Impressions(s) / ED Diagnoses   Final diagnoses:  CAP (community acquired pneumonia)  Slow transit constipation   Shortness of breath related to cough, and likely community-acquired pneumonia. He is somewhat debilitated at this time. Doubt PE,  sepsis or impending vascular collapse. Minimal elevation of troponin with EKG unchanged. No clinical syndrome worrisome for ACS  at this time. Troponin can be monitored, serially, for occult cardiac injury.  Nursing Notes Reviewed/ Care Coordinated, and agree without changes. Applicable Imaging Reviewed.  Interpretation of Laboratory Data incorporated into ED treatment  Plan: Admit  New Prescriptions New Prescriptions   No medications on file     Daleen Bo, MD 04/11/16 1641

## 2016-04-11 NOTE — ED Notes (Signed)
Pt vomited 10 minutes after initiating Azithromycin, medication stopped, notified Dr Eulis Foster, orders for Zofran given and administered.

## 2016-04-12 ENCOUNTER — Other Ambulatory Visit: Payer: Self-pay

## 2016-04-12 DIAGNOSIS — K5901 Slow transit constipation: Secondary | ICD-10-CM | POA: Diagnosis not present

## 2016-04-12 DIAGNOSIS — Z7982 Long term (current) use of aspirin: Secondary | ICD-10-CM | POA: Diagnosis not present

## 2016-04-12 DIAGNOSIS — Z8673 Personal history of transient ischemic attack (TIA), and cerebral infarction without residual deficits: Secondary | ICD-10-CM | POA: Diagnosis not present

## 2016-04-12 DIAGNOSIS — Z87891 Personal history of nicotine dependence: Secondary | ICD-10-CM | POA: Diagnosis not present

## 2016-04-12 DIAGNOSIS — Z79891 Long term (current) use of opiate analgesic: Secondary | ICD-10-CM | POA: Diagnosis not present

## 2016-04-12 DIAGNOSIS — E119 Type 2 diabetes mellitus without complications: Secondary | ICD-10-CM | POA: Diagnosis not present

## 2016-04-12 DIAGNOSIS — Z79899 Other long term (current) drug therapy: Secondary | ICD-10-CM | POA: Diagnosis not present

## 2016-04-12 DIAGNOSIS — G4733 Obstructive sleep apnea (adult) (pediatric): Secondary | ICD-10-CM | POA: Diagnosis not present

## 2016-04-12 DIAGNOSIS — E039 Hypothyroidism, unspecified: Secondary | ICD-10-CM | POA: Diagnosis not present

## 2016-04-12 DIAGNOSIS — M6281 Muscle weakness (generalized): Secondary | ICD-10-CM | POA: Diagnosis not present

## 2016-04-12 DIAGNOSIS — Z7984 Long term (current) use of oral hypoglycemic drugs: Secondary | ICD-10-CM | POA: Diagnosis not present

## 2016-04-12 DIAGNOSIS — J189 Pneumonia, unspecified organism: Secondary | ICD-10-CM | POA: Diagnosis not present

## 2016-04-12 DIAGNOSIS — E1151 Type 2 diabetes mellitus with diabetic peripheral angiopathy without gangrene: Secondary | ICD-10-CM | POA: Diagnosis not present

## 2016-04-12 LAB — BASIC METABOLIC PANEL
Anion gap: 10 (ref 5–15)
BUN: 15 mg/dL (ref 6–20)
CO2: 24 mmol/L (ref 22–32)
Calcium: 8.5 mg/dL — ABNORMAL LOW (ref 8.9–10.3)
Chloride: 103 mmol/L (ref 101–111)
Creatinine, Ser: 0.69 mg/dL (ref 0.61–1.24)
GFR calc Af Amer: >60 mL/min (ref >60)
GFR calc non Af Amer: >60 mL/min (ref >60)
Glucose, Bld: 127 mg/dL — ABNORMAL HIGH (ref 65–99)
Potassium: 3.6 mmol/L (ref 3.5–5.1)
Sodium: 137 mmol/L (ref 135–145)

## 2016-04-12 LAB — TROPONIN I
Troponin I: 0.04 ng/mL (ref <0.03)
Troponin I: 0.04 ng/mL (ref <0.03)
Troponin I: 0.07 ng/mL (ref <0.03)

## 2016-04-12 LAB — GLUCOSE, CAPILLARY
Glucose-Capillary: 126 mg/dL — ABNORMAL HIGH (ref 65–99)
Glucose-Capillary: 130 mg/dL — ABNORMAL HIGH (ref 65–99)
Glucose-Capillary: 333 mg/dL — ABNORMAL HIGH (ref 65–99)
Glucose-Capillary: 134 mg/dL — ABNORMAL HIGH (ref 65–99)
Glucose-Capillary: 134 mg/dL — ABNORMAL HIGH (ref 65–99)

## 2016-04-12 LAB — CBC WITH DIFFERENTIAL/PLATELET
Basophils Absolute: 0 10*3/uL (ref 0.0–0.1)
Basophils Relative: 0 %
Eosinophils Absolute: 0 10*3/uL (ref 0.0–0.7)
Eosinophils Relative: 0 %
HCT: 37.1 % — ABNORMAL LOW (ref 39.0–52.0)
Hemoglobin: 12 g/dL — ABNORMAL LOW (ref 13.0–17.0)
Lymphocytes Relative: 5 %
Lymphs Abs: 0.6 10*3/uL — ABNORMAL LOW (ref 0.7–4.0)
MCH: 31.8 pg (ref 26.0–34.0)
MCHC: 32.3 g/dL (ref 30.0–36.0)
MCV: 98.4 fL (ref 78.0–100.0)
Monocytes Absolute: 0.9 10*3/uL (ref 0.1–1.0)
Monocytes Relative: 8 %
Neutro Abs: 10.4 10*3/uL — ABNORMAL HIGH (ref 1.7–7.7)
Neutrophils Relative %: 87 %
Platelets: 229 10*3/uL (ref 150–400)
RBC: 3.77 MIL/uL — ABNORMAL LOW (ref 4.22–5.81)
RDW: 13.6 % (ref 11.5–15.5)
WBC: 12 10*3/uL — ABNORMAL HIGH (ref 4.0–10.5)

## 2016-04-12 LAB — STREP PNEUMONIAE URINARY ANTIGEN: Strep Pneumo Urinary Antigen: NEGATIVE

## 2016-04-12 MED ORDER — GABAPENTIN 300 MG PO CAPS
600.0000 mg | ORAL_CAPSULE | Freq: Four times a day (QID) | ORAL | Status: DC
  Administered 2016-04-12 – 2016-04-13 (×5): 600 mg via ORAL
  Filled 2016-04-12 (×5): qty 2

## 2016-04-12 MED ORDER — DEXTROSE 5 % IV SOLN
INTRAVENOUS | Status: AC
  Filled 2016-04-12: qty 2

## 2016-04-12 NOTE — Care Management Note (Signed)
Case Management Note  Patient Details  Name: [name] MRN: [id number] Date of Birth: 05/23/1946  Subjective/Objective:                  Pt is from home with CAP. Pt is ind at baseline but recently had surgery on RLE and has a cast in place and is NWB for the next 2 weeks. Pt's wife at beside. Pt has no HH services PTA. Pt requests [facility] to help with NWB status. Pt has chosen [facility] from list of DME providers. Romualdo Bolk, of [facility], made aware of DME referral and will obtain pt info from chart and deliver DME to pt room. Pt plans to return home with self care.  Action/Plan: Will cont to follow, no further needs anticipated.   Expected Discharge Date:     04/13/2016             Expected Discharge Plan:  Home/Self Care  In-House Referral:  NA  Discharge planning Services  CM Consult  Post Acute Care Choice:  Durable Medical Equipment Choice offered to:  Patient  DME Arranged:  Bedside commode DME Agency:     HH Arranged:    [place] Agency:     Status of Service:  Completed, signed off  If discussed at [employer] of Stay Meetings, dates discussed:    Additional Comments:  Sherald Barge, RN 04/12/2016, 2:28 PM

## 2016-04-12 NOTE — Progress Notes (Signed)
**Note De-identified [name] Obfuscation** EKG complete; reported to RN 

## 2016-04-12 NOTE — Progress Notes (Signed)
Subjective: He was brought into the hospital yesterday with pneumonia. He had recent surgery on his foot and ankle. He says he feels better. He has no new complaints. He is not coughing as much. He is not as short of breath.  Objective: Vital signs in last 24 hours: Temp:  [98.3 F (36.8 C)-98.9 F (37.2 C)] 98.7 F (37.1 C) (07/24 0627) Pulse Rate:  [89-102] 102 (07/24 0627) Resp:  [16-30] 20 (07/24 0627) BP: (126-469)/(76-115) 469/90 (07/24 0627) SpO2:  [92 %-100 %] 98 % (07/24 0627) Weight:  [109.8 kg (242 lb)-111.1 kg (245 lb)] 109.8 kg (242 lb) (07/23 1816) Weight change:  Last BM Date: 04/11/16  Intake/Output from previous day: 07/23 0701 - 07/24 0700 In: -  Out: 401 [Urine:400; Stool:1]  PHYSICAL EXAM General appearance: alert, cooperative and no distress Resp: rhonchi bilaterally Cardio: regular rate and rhythm, S1, S2 normal, no murmur, click, rub or gallop GI: soft, non-tender; bowel sounds normal; no masses,  no organomegaly Extremities: He has a postop splint  Lab Results:  Results for orders placed or performed during the hospital encounter of 04/11/16 (from the past 48 hour(s))  CBC with Differential     Status: Abnormal   Collection Time: 04/11/16  1:02 PM  Result Value Ref Range   WBC 12.5 (H) 4.0 - 10.5 K/uL   RBC 3.87 (L) 4.22 - 5.81 MIL/uL   Hemoglobin 12.6 (L) 13.0 - 17.0 g/dL   HCT 37.9 (L) 39.0 - 52.0 %   MCV 97.9 78.0 - 100.0 fL   MCH 32.6 26.0 - 34.0 pg   MCHC 33.2 30.0 - 36.0 g/dL   RDW 13.6 11.5 - 15.5 %   Platelets 241 150 - 400 K/uL   Neutrophils Relative % 88 %   Neutro Abs 11.0 (H) 1.7 - 7.7 K/uL   Lymphocytes Relative 5 %   Lymphs Abs 0.6 (L) 0.7 - 4.0 K/uL   Monocytes Relative 7 %   Monocytes Absolute 0.9 0.1 - 1.0 K/uL   Eosinophils Relative 0 %   Eosinophils Absolute 0.0 0.0 - 0.7 K/uL   Basophils Relative 0 %   Basophils Absolute 0.0 0.0 - 0.1 K/uL  Basic metabolic panel     Status: Abnormal   Collection Time: 04/11/16  1:02 PM   Result Value Ref Range   Sodium 134 (L) 135 - 145 mmol/L   Potassium 3.6 3.5 - 5.1 mmol/L   Chloride 103 101 - 111 mmol/L   CO2 26 22 - 32 mmol/L   Glucose, Bld 118 (H) 65 - 99 mg/dL   BUN 14 6 - 20 mg/dL   Creatinine, Ser 0.76 0.61 - 1.24 mg/dL   Calcium 8.3 (L) 8.9 - 10.3 mg/dL   GFR calc non Af Amer >60 >60 mL/min   GFR calc Af Amer >60 >60 mL/min    Comment: (NOTE) The eGFR has been calculated using the CKD EPI equation. This calculation has not been validated in all clinical situations. eGFR's persistently <60 mL/min signify possible Chronic Kidney Disease.    Anion gap 5 5 - 15  Troponin I     Status: Abnormal   Collection Time: 04/11/16  1:02 PM  Result Value Ref Range   Troponin I 0.04 (HH) <0.03 ng/mL    Comment: CRITICAL RESULT CALLED TO, READ BACK BY AND VERIFIED WITH: WALLACE,L. AT 1405 ON 04/11/2016 BY AGUNDIZ,E.   Culture, blood (routine x 2)     Status: None (Preliminary result)   Collection Time: 04/11/16  2:57 PM  Result Value Ref Range   Specimen Description LEFT ANTECUBITAL    Special Requests BOTTLES DRAWN AEROBIC AND ANAEROBIC [facility] EACH    Culture PENDING    Report Status PENDING   Culture, blood (routine x 2)     Status: None (Preliminary result)   Collection Time: 04/11/16  3:09 PM  Result Value Ref Range   Specimen Description BLOOD LEFT HAND    Special Requests BOTTLES DRAWN AEROBIC AND ANAEROBIC 6CC    Culture PENDING    Report Status PENDING   I-Stat CG4 Lactic Acid, ED     Status: None   Collection Time: 04/11/16  3:31 PM  Result Value Ref Range   Lactic Acid, Venous 0.83 0.5 - 1.9 mmol/L  Glucose, capillary     Status: Abnormal   Collection Time: 04/11/16  9:04 PM  Result Value Ref Range   Glucose-Capillary 155 (H) 65 - 99 mg/dL   Comment 1 Notify RN    Comment 2 Document in Chart   Strep pneumoniae urinary antigen     Status: None   Collection Time: 04/11/16  9:10 PM  Result Value Ref Range   Strep Pneumo Urinary Antigen NEGATIVE  NEGATIVE    Comment: PERFORMED AT [facility]        Infection due to S. pneumoniae cannot be absolutely ruled out since the antigen present may be below the detection limit of the test. Performed at [facility]   Troponin I (q 6hr x 3)     Status: Abnormal   Collection Time: 04/11/16 10:54 PM  Result Value Ref Range   Troponin I 0.04 (HH) <0.03 ng/mL    Comment: CRITICAL VALUE NOTED.  VALUE IS CONSISTENT WITH PREVIOUSLY REPORTED AND CALLED VALUE.  Troponin I (q 6hr x 3)     Status: Abnormal   Collection Time: 04/12/16  2:43 AM  Result Value Ref Range   Troponin I 0.04 (HH) <0.03 ng/mL    Comment: CRITICAL VALUE NOTED.  VALUE IS CONSISTENT WITH PREVIOUSLY REPORTED AND CALLED VALUE.  Basic metabolic panel     Status: Abnormal   Collection Time: 04/12/16  5:55 AM  Result Value Ref Range   Sodium 137 135 - 145 mmol/L   Potassium 3.6 3.5 - 5.1 mmol/L   Chloride 103 101 - 111 mmol/L   CO2 24 22 - 32 mmol/L   Glucose, Bld 127 (H) 65 - 99 mg/dL   BUN 15 6 - 20 mg/dL   Creatinine, Ser 0.69 0.61 - 1.24 mg/dL   Calcium 8.5 (L) 8.9 - 10.3 mg/dL   GFR calc non Af Amer >60 >60 mL/min   GFR calc Af Amer >60 >60 mL/min    Comment: (NOTE) The eGFR has been calculated using the CKD EPI equation. This calculation has not been validated in all clinical situations. eGFR's persistently <60 mL/min signify possible Chronic Kidney Disease.    Anion gap 10 5 - 15  CBC WITH DIFFERENTIAL     Status: Abnormal   Collection Time: 04/12/16  5:55 AM  Result Value Ref Range   WBC 12.0 (H) 4.0 - 10.5 K/uL   RBC 3.77 (L) 4.22 - 5.81 MIL/uL   Hemoglobin 12.0 (L) 13.0 - 17.0 g/dL   HCT 37.1 (L) 39.0 - 52.0 %   MCV 98.4 78.0 - 100.0 fL   MCH 31.8 26.0 - 34.0 pg   MCHC 32.3 30.0 - 36.0 g/dL   RDW 13.6 11.5 - 15.5 %  Platelets 229 150 - 400 K/uL   Neutrophils Relative % 87 %   Neutro Abs 10.4 (H) 1.7 - 7.7 K/uL   Lymphocytes Relative 5 %   Lymphs Abs 0.6 (L) 0.7 - 4.0 K/uL    Monocytes Relative 8 %   Monocytes Absolute 0.9 0.1 - 1.0 K/uL   Eosinophils Relative 0 %   Eosinophils Absolute 0.0 0.0 - 0.7 K/uL   Basophils Relative 0 %   Basophils Absolute 0.0 0.0 - 0.1 K/uL  Glucose, capillary     Status: Abnormal   Collection Time: 04/12/16  7:44 AM  Result Value Ref Range   Glucose-Capillary 126 (H) 65 - 99 mg/dL    ABGS No results for input(s): PHART, PO2ART, TCO2, HCO3 in the last 72 hours.  Invalid input(s): PCO2 CULTURES Recent Results (from the past 240 hour(s))  Culture, blood (routine x 2)     Status: None (Preliminary result)   Collection Time: 04/11/16  2:57 PM  Result Value Ref Range Status   Specimen Description LEFT ANTECUBITAL  Final   Special Requests BOTTLES DRAWN AEROBIC AND ANAEROBIC [place]  Final   Culture PENDING  Incomplete   Report Status PENDING  Incomplete  Culture, blood (routine x 2)     Status: None (Preliminary result)   Collection Time: 04/11/16  3:09 PM  Result Value Ref Range Status   Specimen Description BLOOD LEFT HAND  Final   Special Requests BOTTLES DRAWN AEROBIC AND ANAEROBIC [place]  Final   Culture PENDING  Incomplete   Report Status PENDING  Incomplete   Studies/Results: Dg Chest 2 View  Result Date: 04/11/2016 CLINICAL DATA:  Shortness of breath, weakness EXAM: CHEST  2 VIEW COMPARISON:  01/29/2014 FINDINGS: Mild patchy opacities in the right upper lobe and right infrahilar region, suspicious for pneumonia, less likely asymmetric interstitial edema. Associated trace bilateral pleural effusions. No pneumothorax. Cardiomegaly. Degenerative changes of the visualized thoracolumbar spine. IMPRESSION: Mild patchy opacities in the right upper and lower lobes, suspicious for pneumonia, less likely asymmetric interstitial edema. Associated trace bilateral pleural effusions. Electronically Signed   By: Julian Hy M.D.   On: 04/11/2016 12:37   Medications:  Prior to Admission:  Prescriptions Prior to Admission   Medication Sig Dispense Refill Last Dose  . amLODipine (NORVASC) 10 MG tablet Take 10 mg by mouth daily.    04/11/2016 at Unknown time  . aspirin EC 81 MG tablet Take 1 tablet (81 mg total) by mouth daily. (Patient taking differently: Take 325 mg by mouth daily. )   04/11/2016 at Unknown time  . b complex vitamins tablet Take 1 tablet by mouth daily.   04/10/2016 at Unknown time  . clopidogrel (PLAVIX) 75 MG tablet Take 75 mg by mouth daily.    04/11/2016 at Unknown time  . DULoxetine (CYMBALTA) 20 MG capsule Take 1 capsule by mouth 2 (two) times daily.   04/11/2016 at Unknown time  . Empagliflozin-Linagliptin 10-5 MG TABS Take 1 tablet by mouth daily.   04/11/2016 at Unknown time  . gabapentin (NEURONTIN) 600 MG tablet Take 600 mg by mouth 4 (four) times daily.    04/11/2016 at Unknown time  . glimepiride (AMARYL) 4 MG tablet Take 4 mg by mouth daily with breakfast.   04/11/2016 at Unknown time  . HYDROmorphone (DILAUDID) 2 MG tablet Take 1 tablet by mouth every 4 (four) hours as needed for pain.   Past Week at Unknown time  . levothyroxine (SYNTHROID, LEVOTHROID) 25 MCG tablet Take 50  mcg by mouth daily.    04/11/2016 at Unknown time  . Omega 3-6-9 Fatty Acids (OMEGA 3-6-9 COMPLEX) CAPS Take 1 capsule by mouth 2 (two) times daily.   04/10/2016 at Unknown time  . pravastatin (PRAVACHOL) 40 MG tablet Take 40 mg by mouth every morning.    04/11/2016 at Unknown time  . rOPINIRole (REQUIP) 0.5 MG tablet Take 1 tablet (0.5 mg total) by mouth at bedtime. 30 tablet 11 04/10/2016 at Unknown time  . saccharomyces boulardii (FLORASTOR) 250 MG capsule Take 1 capsule (250 mg total) by mouth 2 (two) times daily. 60 capsule 2 04/10/2016 at Unknown time   Scheduled: . amLODipine  10 mg Oral Daily  . aspirin EC  325 mg Oral Daily  . cefTRIAXone (ROCEPHIN)  IV  2 g Intravenous Q24H  . clopidogrel  75 mg Oral Daily  . doxycycline  100 mg Oral Q12H  . DULoxetine  20 mg Oral BID  . enoxaparin (LOVENOX) injection  40 mg  Subcutaneous Q24H  . gabapentin  600 mg Oral QID  . insulin aspart  0-15 Units Subcutaneous TID WC  . levothyroxine  50 mcg Oral QAC breakfast  . polyethylene glycol  17 g Oral Daily  . pravastatin  40 mg Oral Daily  . rOPINIRole  0.5 mg Oral QHS  . saccharomyces boulardii  250 mg Oral BID   Continuous:  [id number], HYDROmorphone  Assesment: He was admitted with community-acquired pneumonia. He feels better. He has multiple other medical problems including diabetes with peripheral arterial disease from that. He's had recent surgery. He has sleep apnea and has been using CPAP and that stable. His troponin level is elevated but he has not had any chest pain and no evidence of acute coronary syndrome on EKG and expect that this is probably related to demand ischemia pneumonia etc. Principal Problem:   CAP (community acquired pneumonia) Active Problems:   Hypertension   Type 2 diabetes mellitus with circulatory disorder (HCC)   Hyperlipidemia   Peripheral arterial disease (HCC)   Restless leg syndrome   Obstructive sleep apnea   Elevated troponin    Plan: Continue current treatments. No new medications. I think he'll probably be able to be discharged tomorrow    LOS: 0 days   Peniel Hass L 04/12/2016, 8:09 AM

## 2016-04-12 NOTE — Care Management Obs Status (Signed)
[place] NOTIFICATION   Patient Details  Name: JEORGE MUNRO MRN: [id number] Date of Birth: 06-May-1946   Medicare Observation Status Notification Given:  Yes    Sherald Barge, RN 04/12/2016, 2:26 PM

## 2016-04-13 DIAGNOSIS — Z8673 Personal history of transient ischemic attack (TIA), and cerebral infarction without residual deficits: Secondary | ICD-10-CM | POA: Diagnosis not present

## 2016-04-13 DIAGNOSIS — J189 Pneumonia, unspecified organism: Secondary | ICD-10-CM | POA: Diagnosis not present

## 2016-04-13 DIAGNOSIS — E039 Hypothyroidism, unspecified: Secondary | ICD-10-CM | POA: Diagnosis not present

## 2016-04-13 DIAGNOSIS — Z7982 Long term (current) use of aspirin: Secondary | ICD-10-CM | POA: Diagnosis not present

## 2016-04-13 DIAGNOSIS — Z7984 Long term (current) use of oral hypoglycemic drugs: Secondary | ICD-10-CM | POA: Diagnosis not present

## 2016-04-13 DIAGNOSIS — K5901 Slow transit constipation: Secondary | ICD-10-CM | POA: Diagnosis not present

## 2016-04-13 DIAGNOSIS — Z79891 Long term (current) use of opiate analgesic: Secondary | ICD-10-CM | POA: Diagnosis not present

## 2016-04-13 DIAGNOSIS — E119 Type 2 diabetes mellitus without complications: Secondary | ICD-10-CM | POA: Diagnosis not present

## 2016-04-13 DIAGNOSIS — Z79899 Other long term (current) drug therapy: Secondary | ICD-10-CM | POA: Diagnosis not present

## 2016-04-13 DIAGNOSIS — E1151 Type 2 diabetes mellitus with diabetic peripheral angiopathy without gangrene: Secondary | ICD-10-CM | POA: Diagnosis not present

## 2016-04-13 DIAGNOSIS — Z87891 Personal history of nicotine dependence: Secondary | ICD-10-CM | POA: Diagnosis not present

## 2016-04-13 LAB — HEMOGLOBIN A1C
Hgb A1c MFr Bld: 6.5 % — ABNORMAL HIGH (ref 4.8–5.6)
Mean Plasma Glucose: 140 mg/dL

## 2016-04-13 LAB — GLUCOSE, CAPILLARY: Glucose-Capillary: 118 mg/dL — ABNORMAL HIGH (ref 65–99)

## 2016-04-13 LAB — HIV ANTIBODY (ROUTINE TESTING W REFLEX): HIV Screen 4th Generation wRfx: NONREACTIVE

## 2016-04-13 MED ORDER — DOXYCYCLINE HYCLATE 100 MG PO TABS: 100.0000 mg | ORAL_TABLET | Freq: Two times a day (BID) | ORAL | 0 refills | Status: DC

## 2016-04-13 MED ORDER — CEFUROXIME AXETIL 500 MG PO TABS: 500.0000 mg | ORAL_TABLET | Freq: Two times a day (BID) | ORAL | 0 refills | Status: DC

## 2016-04-13 NOTE — Progress Notes (Signed)
Subjective:  He says he feels much better. No shortness of breath no chest pain and no other complaints. He wants to go home  Objective: Vital signs in last 24 hours: Temp:  [98.2 F (36.8 C)-98.5 F (36.9 C)] 98.5 F (36.9 C) (07/25 0558) Pulse Rate:  [92-98] 98 (07/25 0558) Resp:  [18-20] 18 (07/25 0558) BP: (140-158)/(75-88) 140/77 (07/25 0558) SpO2:  [94 %-95 %] 95 % (07/25 0558) Weight change:  Last BM Date: 04/11/16  Intake/Output from previous day: 07/24 0701 - 07/25 0700 In: 630 [P.O.:630] Out: 1400 [Urine:1400]  PHYSICAL EXAM General appearance: alert, cooperative and no distress Resp: Rhonchi bilaterally Cardio: regular rate and rhythm, S1, S2 normal, no murmur, click, rub or gallop GI: soft, non-tender; bowel sounds normal; no masses,  no organomegaly Extremities: His foot is wrapped in in a immobilizer  Lab Results:  Results for orders placed or performed during the hospital encounter of 04/11/16 (from the past 48 hour(s))  HIV antibody     Status: None   Collection Time: 04/11/16 12:53 PM  Result Value Ref Range   HIV Screen 4th Generation wRfx Non Reactive Non Reactive    Comment: (NOTE) Performed At: [facility] [place], [place] [id number] Lindon Romp MD [id number]   Hemoglobin A1c     Status: Abnormal   Collection Time: 04/11/16 12:53 PM  Result Value Ref Range   Hgb A1c MFr Bld 6.5 (H) 4.8 - 5.6 %    Comment: (NOTE)         Pre-diabetes: 5.7 - 6.4         Diabetes: >6.4         Glycemic control for adults with diabetes: <7.0    Mean Plasma Glucose 140 mg/dL    Comment: (NOTE) Performed At: [facility] [place], [place] [id number] Lindon Romp MD [id number]   CBC with Differential     Status: Abnormal   Collection Time: 04/11/16  1:02 PM  Result Value Ref Range   WBC 12.5 (H) 4.0 - 10.5 K/uL   RBC 3.87 (L) 4.22 - 5.81 MIL/uL   Hemoglobin 12.6 (L) 13.0 - 17.0 g/dL   HCT 37.9 (L)  39.0 - 52.0 %   MCV 97.9 78.0 - 100.0 fL   MCH 32.6 26.0 - 34.0 pg   MCHC 33.2 30.0 - 36.0 g/dL   RDW 13.6 11.5 - 15.5 %   Platelets 241 150 - 400 K/uL   Neutrophils Relative % 88 %   Neutro Abs 11.0 (H) 1.7 - 7.7 K/uL   Lymphocytes Relative 5 %   Lymphs Abs 0.6 (L) 0.7 - 4.0 K/uL   Monocytes Relative 7 %   Monocytes Absolute 0.9 0.1 - 1.0 K/uL   Eosinophils Relative 0 %   Eosinophils Absolute 0.0 0.0 - 0.7 K/uL   Basophils Relative 0 %   Basophils Absolute 0.0 0.0 - 0.1 K/uL  Basic metabolic panel     Status: Abnormal   Collection Time: 04/11/16  1:02 PM  Result Value Ref Range   Sodium 134 (L) 135 - 145 mmol/L   Potassium 3.6 3.5 - 5.1 mmol/L   Chloride 103 101 - 111 mmol/L   CO2 26 22 - 32 mmol/L   Glucose, Bld 118 (H) 65 - 99 mg/dL   BUN 14 6 - 20 mg/dL   Creatinine, Ser 0.76 0.61 - 1.24 mg/dL   Calcium 8.3 (L) 8.9 - 10.3 mg/dL   GFR calc non Af  Amer >60 >60 mL/min   GFR calc Af Amer >60 >60 mL/min    Comment: (NOTE) The eGFR has been calculated using the CKD EPI equation. This calculation has not been validated in all clinical situations. eGFR's persistently <60 mL/min signify possible Chronic Kidney Disease.    Anion gap 5 5 - 15  Troponin I     Status: Abnormal   Collection Time: 04/11/16  1:02 PM  Result Value Ref Range   Troponin I 0.04 (HH) <0.03 ng/mL    Comment: CRITICAL RESULT CALLED TO, READ BACK BY AND VERIFIED WITH: WALLACE,L. AT 1405 ON 04/11/2016 BY AGUNDIZ,E.   Culture, blood (routine x 2)     Status: None (Preliminary result)   Collection Time: 04/11/16  2:57 PM  Result Value Ref Range   Specimen Description LEFT ANTECUBITAL    Special Requests BOTTLES DRAWN AEROBIC AND ANAEROBIC 6CC EACH    Culture NO GROWTH < 24 HOURS    Report Status PENDING   Culture, blood (routine x 2)     Status: None (Preliminary result)   Collection Time: 04/11/16  3:09 PM  Result Value Ref Range   Specimen Description BLOOD LEFT HAND    Special Requests BOTTLES DRAWN  AEROBIC AND ANAEROBIC 6CC    Culture NO GROWTH < 24 HOURS    Report Status PENDING   I-Stat CG4 Lactic Acid, ED     Status: None   Collection Time: 04/11/16  3:31 PM  Result Value Ref Range   Lactic Acid, Venous 0.83 0.5 - 1.9 mmol/L  Glucose, capillary     Status: Abnormal   Collection Time: 04/11/16  9:04 PM  Result Value Ref Range   Glucose-Capillary 155 (H) 65 - 99 mg/dL   Comment 1 Notify RN    Comment 2 Document in Chart   Strep pneumoniae urinary antigen     Status: None   Collection Time: 04/11/16  9:10 PM  Result Value Ref Range   Strep Pneumo Urinary Antigen NEGATIVE NEGATIVE    Comment: PERFORMED AT [facility]        Infection due to S. pneumoniae cannot be absolutely ruled out since the antigen present may be below the detection limit of the test. Performed at [facility]   Troponin I (q 6hr x 3)     Status: Abnormal   Collection Time: 04/11/16 10:54 PM  Result Value Ref Range   Troponin I 0.04 (HH) <0.03 ng/mL    Comment: CRITICAL VALUE NOTED.  VALUE IS CONSISTENT WITH PREVIOUSLY REPORTED AND CALLED VALUE.  Troponin I (q 6hr x 3)     Status: Abnormal   Collection Time: 04/12/16  2:43 AM  Result Value Ref Range   Troponin I 0.04 (HH) <0.03 ng/mL    Comment: CRITICAL VALUE NOTED.  VALUE IS CONSISTENT WITH PREVIOUSLY REPORTED AND CALLED VALUE.  Basic metabolic panel     Status: Abnormal   Collection Time: 04/12/16  5:55 AM  Result Value Ref Range   Sodium 137 135 - 145 mmol/L   Potassium 3.6 3.5 - 5.1 mmol/L   Chloride 103 101 - 111 mmol/L   CO2 24 22 - 32 mmol/L   Glucose, Bld 127 (H) 65 - 99 mg/dL   BUN 15 6 - 20 mg/dL   Creatinine, Ser 0.69 0.61 - 1.24 mg/dL   Calcium 8.5 (L) 8.9 - 10.3 mg/dL   GFR calc non Af Amer >60 >60 mL/min   GFR calc Af Amer >60 >60 mL/min  Comment: (NOTE) The eGFR has been calculated using the CKD EPI equation. This calculation has not been validated in all clinical situations. eGFR's persistently <60 mL/min  signify possible Chronic Kidney Disease.    Anion gap 10 5 - 15  CBC WITH DIFFERENTIAL     Status: Abnormal   Collection Time: 04/12/16  5:55 AM  Result Value Ref Range   WBC 12.0 (H) 4.0 - 10.5 K/uL   RBC 3.77 (L) 4.22 - 5.81 MIL/uL   Hemoglobin 12.0 (L) 13.0 - 17.0 g/dL   HCT 37.1 (L) 39.0 - 52.0 %   MCV 98.4 78.0 - 100.0 fL   MCH 31.8 26.0 - 34.0 pg   MCHC 32.3 30.0 - 36.0 g/dL   RDW 13.6 11.5 - 15.5 %   Platelets 229 150 - 400 K/uL   Neutrophils Relative % 87 %   Neutro Abs 10.4 (H) 1.7 - 7.7 K/uL   Lymphocytes Relative 5 %   Lymphs Abs 0.6 (L) 0.7 - 4.0 K/uL   Monocytes Relative 8 %   Monocytes Absolute 0.9 0.1 - 1.0 K/uL   Eosinophils Relative 0 %   Eosinophils Absolute 0.0 0.0 - 0.7 K/uL   Basophils Relative 0 %   Basophils Absolute 0.0 0.0 - 0.1 K/uL  Glucose, capillary     Status: Abnormal   Collection Time: 04/12/16  7:44 AM  Result Value Ref Range   Glucose-Capillary 126 (H) 65 - 99 mg/dL  Troponin I (q 6hr x 3)     Status: Abnormal   Collection Time: 04/12/16  8:09 AM  Result Value Ref Range   Troponin I 0.07 (HH) <0.03 ng/mL    Comment: CRITICAL RESULT CALLED TO, READ BACK BY AND VERIFIED WITH: BUSICK,C AT 9:30AM ON 04/12/16 BY FESTERMAN,C   Glucose, capillary     Status: Abnormal   Collection Time: 04/12/16 11:25 AM  Result Value Ref Range   Glucose-Capillary 333 (H) 65 - 99 mg/dL  Glucose, capillary     Status: Abnormal   Collection Time: 04/12/16 11:27 AM  Result Value Ref Range   Glucose-Capillary 130 (H) 65 - 99 mg/dL  Glucose, capillary     Status: Abnormal   Collection Time: 04/12/16  3:33 PM  Result Value Ref Range   Glucose-Capillary 134 (H) 65 - 99 mg/dL   Comment 1 Notify RN    Comment 2 Document in Chart   Glucose, capillary     Status: Abnormal   Collection Time: 04/12/16  9:06 PM  Result Value Ref Range   Glucose-Capillary 134 (H) 65 - 99 mg/dL   Comment 1 Notify RN    Comment 2 Document in Chart   Glucose, capillary     Status:  Abnormal   Collection Time: 04/13/16  7:27 AM  Result Value Ref Range   Glucose-Capillary 118 (H) 65 - 99 mg/dL    ABGS No results for input(s): PHART, PO2ART, TCO2, HCO3 in the last 72 hours.  Invalid input(s): PCO2 CULTURES Recent Results (from the past 240 hour(s))  Culture, blood (routine x 2)     Status: None (Preliminary result)   Collection Time: 04/11/16  2:57 PM  Result Value Ref Range Status   Specimen Description LEFT ANTECUBITAL  Final   Special Requests BOTTLES DRAWN AEROBIC AND ANAEROBIC 6CC EACH  Final   Culture NO GROWTH < 24 HOURS  Final   Report Status PENDING  Incomplete  Culture, blood (routine x 2)     Status: None (Preliminary result)  Collection Time: 04/11/16  3:09 PM  Result Value Ref Range Status   Specimen Description BLOOD LEFT HAND  Final   Special Requests BOTTLES DRAWN AEROBIC AND ANAEROBIC 6CC  Final   Culture NO GROWTH < 24 HOURS  Final   Report Status PENDING  Incomplete   Studies/Results: Dg Chest 2 View  Result Date: 04/11/2016 CLINICAL DATA:  Shortness of breath, weakness EXAM: CHEST  2 VIEW COMPARISON:  01/29/2014 FINDINGS: Mild patchy opacities in the right upper lobe and right infrahilar region, suspicious for pneumonia, less likely asymmetric interstitial edema. Associated trace bilateral pleural effusions. No pneumothorax. Cardiomegaly. Degenerative changes of the visualized thoracolumbar spine. IMPRESSION: Mild patchy opacities in the right upper and lower lobes, suspicious for pneumonia, less likely asymmetric interstitial edema. Associated trace bilateral pleural effusions. Electronically Signed   By: Julian Hy M.D.   On: 04/11/2016 12:37   Medications:  Prior to Admission:  Prescriptions Prior to Admission  Medication Sig Dispense Refill Last Dose  . amLODipine (NORVASC) 10 MG tablet Take 10 mg by mouth daily.    04/11/2016 at Unknown time  . aspirin EC 81 MG tablet Take 1 tablet (81 mg total) by mouth daily. (Patient taking  differently: Take 325 mg by mouth daily. )   04/11/2016 at Unknown time  . b complex vitamins tablet Take 1 tablet by mouth daily.   04/10/2016 at Unknown time  . clopidogrel (PLAVIX) 75 MG tablet Take 75 mg by mouth daily.    04/11/2016 at Unknown time  . DULoxetine (CYMBALTA) 20 MG capsule Take 1 capsule by mouth 2 (two) times daily.   04/11/2016 at Unknown time  . Empagliflozin-Linagliptin 10-5 MG TABS Take 1 tablet by mouth daily.   04/11/2016 at Unknown time  . gabapentin (NEURONTIN) 600 MG tablet Take 600 mg by mouth 4 (four) times daily.    04/11/2016 at Unknown time  . glimepiride (AMARYL) 4 MG tablet Take 4 mg by mouth daily with breakfast.   04/11/2016 at Unknown time  . HYDROmorphone (DILAUDID) 2 MG tablet Take 1 tablet by mouth every 4 (four) hours as needed for pain.   Past Week at Unknown time  . levothyroxine (SYNTHROID, LEVOTHROID) 25 MCG tablet Take 50 mcg by mouth daily.    04/11/2016 at Unknown time  . Omega 3-6-9 Fatty Acids (OMEGA 3-6-9 COMPLEX) CAPS Take 1 capsule by mouth 2 (two) times daily.   04/10/2016 at Unknown time  . pravastatin (PRAVACHOL) 40 MG tablet Take 40 mg by mouth every morning.    04/11/2016 at Unknown time  . rOPINIRole (REQUIP) 0.5 MG tablet Take 1 tablet (0.5 mg total) by mouth at bedtime. 30 tablet 11 04/10/2016 at Unknown time  . saccharomyces boulardii (FLORASTOR) 250 MG capsule Take 1 capsule (250 mg total) by mouth 2 (two) times daily. 60 capsule 2 04/10/2016 at Unknown time   Scheduled: . amLODipine  10 mg Oral Daily  . aspirin EC  325 mg Oral Daily  . cefTRIAXone (ROCEPHIN)  IV  2 g Intravenous Q24H  . clopidogrel  75 mg Oral Daily  . doxycycline  100 mg Oral Q12H  . DULoxetine  20 mg Oral BID  . enoxaparin (LOVENOX) injection  40 mg Subcutaneous Q24H  . gabapentin  600 mg Oral QID  . insulin aspart  0-15 Units Subcutaneous TID WC  . levothyroxine  50 mcg Oral QAC breakfast  . polyethylene glycol  17 g Oral Daily  . pravastatin  40 mg Oral Daily  .  rOPINIRole  0.5 mg Oral QHS  . saccharomyces boulardii  250 mg Oral BID   Continuous:  [id number], HYDROmorphone  Assesment: He was admitted with community-acquired pneumonia. He is much improved. He has diabetes and has peripheral arterial disease probably related to that. His troponin level was elevated but his EKG shows no evidence of ischemia. I had originally requested cardiology consultation but I'm going to cancel that because I believe this is from demand ischemia/pneumonia not an acute coronary event. Principal Problem:   CAP (community acquired pneumonia) Active Problems:   Hypertension   Type 2 diabetes mellitus with circulatory disorder (HCC)   Hyperlipidemia   Peripheral arterial disease (HCC)   Restless leg syndrome   Obstructive sleep apnea   Elevated troponin    Plan: Discharge home today    LOS: 0 days   Ritika Hellickson L 04/13/2016, 8:20 AM

## 2016-04-13 NOTE — Discharge Summary (Signed)
Physician Discharge Summary  Patient ID: [name] MRN: [id number] DOB/AGE: August 19, 1946 70 y.o. Primary Care Physician:Dorsey Authement L, MD Admit date: 04/11/2016 Discharge date: 04/13/2016    Discharge Diagnoses:   Principal Problem:   CAP (community acquired pneumonia) Active Problems:   Hypertension   Type 2 diabetes mellitus with circulatory disorder (HCC)   Hyperlipidemia   Peripheral arterial disease (HCC)   Restless leg syndrome   Obstructive sleep apnea   Elevated troponin     Medication List    TAKE these medications   amLODipine 10 MG tablet Commonly known as:  NORVASC Take 10 mg by mouth daily.   aspirin EC 81 MG tablet Take 1 tablet (81 mg total) by mouth daily. What changed:  how much to take   b complex vitamins tablet Take 1 tablet by mouth daily.   cefUROXime 500 MG tablet Commonly known as:  CEFTIN Take 1 tablet (500 mg total) by mouth 2 (two) times daily with a meal.   clopidogrel 75 MG tablet Commonly known as:  PLAVIX Take 75 mg by mouth daily.   doxycycline 100 MG tablet Commonly known as:  VIBRA-TABS Take 1 tablet (100 mg total) by mouth every 12 (twelve) hours.   DULoxetine 20 MG capsule Commonly known as:  CYMBALTA Take 1 capsule by mouth 2 (two) times daily.   Empagliflozin-Linagliptin 10-5 MG Tabs Take 1 tablet by mouth daily.   gabapentin 600 MG tablet Commonly known as:  NEURONTIN Take 600 mg by mouth 4 (four) times daily.   glimepiride 4 MG tablet Commonly known as:  AMARYL Take 4 mg by mouth daily with breakfast.   HYDROmorphone 2 MG tablet Commonly known as:  DILAUDID Take 1 tablet by mouth every 4 (four) hours as needed for pain.   levothyroxine 25 MCG tablet Commonly known as:  SYNTHROID, LEVOTHROID Take 50 mcg by mouth daily.   OMEGA 3-6-9 COMPLEX Caps Take 1 capsule by mouth 2 (two) times daily.   pravastatin 40 MG tablet Commonly known as:  PRAVACHOL Take 40 mg by mouth every morning.   rOPINIRole  0.5 MG tablet Commonly known as:  REQUIP Take 1 tablet (0.5 mg total) by mouth at bedtime.   saccharomyces boulardii 250 MG capsule Commonly known as:  FLORASTOR Take 1 capsule (250 mg total) by mouth 2 (two) times daily.       Discharged Condition:Improved    Consults: None  Significant Diagnostic Studies: Dg Chest 2 View  Result Date: 04/11/2016 CLINICAL DATA:  Shortness of breath, weakness EXAM: CHEST  2 VIEW COMPARISON:  01/29/2014 FINDINGS: Mild patchy opacities in the right upper lobe and right infrahilar region, suspicious for pneumonia, less likely asymmetric interstitial edema. Associated trace bilateral pleural effusions. No pneumothorax. Cardiomegaly. Degenerative changes of the visualized thoracolumbar spine. IMPRESSION: Mild patchy opacities in the right upper and lower lobes, suspicious for pneumonia, less likely asymmetric interstitial edema. Associated trace bilateral pleural effusions. Electronically Signed   By: Julian Hy M.D.   On: 04/11/2016 12:37   Lab Results: Basic Metabolic Panel:  Recent Labs  04/11/16 1302 04/12/16 0555  NA 134* 137  K 3.6 3.6  CL 103 103  CO2 26 24  GLUCOSE 118* 127*  BUN 14 15  CREATININE 0.76 0.69  CALCIUM 8.3* 8.5*   Liver Function Tests: No results for input(s): AST, ALT, ALKPHOS, BILITOT, PROT, ALBUMIN in the last 72 hours.   CBC:  Recent Labs  04/11/16 1302 04/12/16 0555  WBC 12.5* 12.0*  NEUTROABS 11.0*  10.4*  HGB 12.6* 12.0*  HCT 37.9* 37.1*  MCV 97.9 98.4  PLT 241 229    Recent Results (from the past 240 hour(s))  Culture, blood (routine x 2)     Status: None (Preliminary result)   Collection Time: 04/11/16  2:57 PM  Result Value Ref Range Status   Specimen Description LEFT ANTECUBITAL  Final   Special Requests BOTTLES DRAWN AEROBIC AND ANAEROBIC 6CC EACH  Final   Culture NO GROWTH < 24 HOURS  Final   Report Status PENDING  Incomplete  Culture, blood (routine x 2)     Status: None  (Preliminary result)   Collection Time: 04/11/16  3:09 PM  Result Value Ref Range Status   Specimen Description BLOOD LEFT HAND  Final   Special Requests BOTTLES DRAWN AEROBIC AND ANAEROBIC [place]  Final   Culture NO GROWTH < 24 HOURS  Final   Report Status PENDING  Incomplete     Hospital Course: This is a 70 year old who came to the hospital because of shortness of breath cough and congestion. He was found to have pneumonia on chest x-ray. He was started on treatment for pneumonia and improved. He had a reaction to Zithromax in the emergency department. He was much improved in 48 hours. He did have elevated troponin but his EKG showed no evidence of acute coronary syndrome. This is felt to be related to demand ischemia pneumonia decreased [place] etc. He had no chest pain. He is remarkably improved at the time of discharge and is discharged home. He does not want home health services  Discharge Exam: Blood pressure 140/77, pulse 98, temperature 98.5 F (36.9 C), temperature source Oral, resp. rate 18, height 6' (1.829 m), weight 109.8 kg (242 lb), SpO2 95 %. He is awake and alert. He still has some rhonchi on his chest examination. He looks very comfortable  Disposition: Home to continue antibiotics for 1 week  Discharge Instructions    Discharge patient    Complete by:  As directed        Signed: Tamryn Popko L   04/13/2016, 8:26 AM

## 2016-04-14 LAB — LEGIONELLA PNEUMOPHILA SEROGP 1 UR AG: L. pneumophila Serogp 1 Ur Ag: NEGATIVE

## 2016-04-17 LAB — CULTURE, BLOOD (ROUTINE X 2)
Culture: NO GROWTH
Culture: NO GROWTH

## 2016-04-22 DIAGNOSIS — Z4789 Encounter for other orthopedic aftercare: Secondary | ICD-10-CM | POA: Diagnosis not present

## 2016-04-22 DIAGNOSIS — I739 Peripheral vascular disease, unspecified: Secondary | ICD-10-CM | POA: Diagnosis not present

## 2016-04-22 DIAGNOSIS — L97519 Non-pressure chronic ulcer of other part of right foot with unspecified severity: Secondary | ICD-10-CM | POA: Diagnosis not present

## 2016-05-04 DIAGNOSIS — Z4789 Encounter for other orthopedic aftercare: Secondary | ICD-10-CM | POA: Diagnosis not present

## 2016-05-16 ENCOUNTER — Encounter (HOSPITAL_COMMUNITY): Payer: Self-pay

## 2016-05-16 ENCOUNTER — Inpatient Hospital Stay (HOSPITAL_COMMUNITY)
Admission: EM | Admit: 2016-05-16 | Discharge: 2016-05-21 | DRG: 287 | Disposition: A | Discharge status: Home / Self Care | Payer: PPO | Attending: Cardiology | Admitting: Cardiology

## 2016-05-16 ENCOUNTER — Emergency Department (HOSPITAL_COMMUNITY): Payer: PPO

## 2016-05-16 DIAGNOSIS — R0602 Shortness of breath: Secondary | ICD-10-CM

## 2016-05-16 DIAGNOSIS — G2581 Restless legs syndrome: Secondary | ICD-10-CM | POA: Diagnosis not present

## 2016-05-16 DIAGNOSIS — I255 Ischemic cardiomyopathy: Secondary | ICD-10-CM | POA: Diagnosis present

## 2016-05-16 DIAGNOSIS — G629 Polyneuropathy, unspecified: Secondary | ICD-10-CM

## 2016-05-16 DIAGNOSIS — E785 Hyperlipidemia, unspecified: Secondary | ICD-10-CM | POA: Diagnosis present

## 2016-05-16 DIAGNOSIS — I471 Supraventricular tachycardia: Secondary | ICD-10-CM | POA: Diagnosis present

## 2016-05-16 DIAGNOSIS — R05 Cough: Secondary | ICD-10-CM | POA: Diagnosis not present

## 2016-05-16 DIAGNOSIS — I429 Cardiomyopathy, unspecified: Secondary | ICD-10-CM

## 2016-05-16 DIAGNOSIS — I110 Hypertensive heart disease with heart failure: Secondary | ICD-10-CM | POA: Diagnosis not present

## 2016-05-16 DIAGNOSIS — R911 Solitary pulmonary nodule: Secondary | ICD-10-CM | POA: Diagnosis not present

## 2016-05-16 DIAGNOSIS — I2510 Atherosclerotic heart disease of native coronary artery without angina pectoris: Secondary | ICD-10-CM | POA: Diagnosis present

## 2016-05-16 DIAGNOSIS — I2089 Other forms of angina pectoris: Secondary | ICD-10-CM | POA: Diagnosis present

## 2016-05-16 DIAGNOSIS — Z8249 Family history of ischemic heart disease and other diseases of the circulatory system: Secondary | ICD-10-CM

## 2016-05-16 DIAGNOSIS — I5033 Acute on chronic diastolic (congestive) heart failure: Secondary | ICD-10-CM | POA: Diagnosis present

## 2016-05-16 DIAGNOSIS — I499 Cardiac arrhythmia, unspecified: Secondary | ICD-10-CM | POA: Diagnosis present

## 2016-05-16 DIAGNOSIS — I509 Heart failure, unspecified: Secondary | ICD-10-CM

## 2016-05-16 DIAGNOSIS — I208 Other forms of angina pectoris: Secondary | ICD-10-CM | POA: Diagnosis present

## 2016-05-16 DIAGNOSIS — R079 Chest pain, unspecified: Secondary | ICD-10-CM | POA: Diagnosis present

## 2016-05-16 DIAGNOSIS — I739 Peripheral vascular disease, unspecified: Secondary | ICD-10-CM | POA: Diagnosis present

## 2016-05-16 DIAGNOSIS — I10 Essential (primary) hypertension: Secondary | ICD-10-CM | POA: Diagnosis present

## 2016-05-16 DIAGNOSIS — I272 Other secondary pulmonary hypertension: Secondary | ICD-10-CM | POA: Diagnosis present

## 2016-05-16 DIAGNOSIS — R778 Other specified abnormalities of plasma proteins: Secondary | ICD-10-CM | POA: Diagnosis present

## 2016-05-16 DIAGNOSIS — Z7902 Long term (current) use of antithrombotics/antiplatelets: Secondary | ICD-10-CM

## 2016-05-16 DIAGNOSIS — Z888 Allergy status to other drugs, medicaments and biological substances status: Secondary | ICD-10-CM | POA: Diagnosis not present

## 2016-05-16 DIAGNOSIS — Z87891 Personal history of nicotine dependence: Secondary | ICD-10-CM

## 2016-05-16 DIAGNOSIS — Z7984 Long term (current) use of oral hypoglycemic drugs: Secondary | ICD-10-CM

## 2016-05-16 DIAGNOSIS — I5021 Acute systolic (congestive) heart failure: Secondary | ICD-10-CM | POA: Diagnosis not present

## 2016-05-16 DIAGNOSIS — Z981 Arthrodesis status: Secondary | ICD-10-CM | POA: Diagnosis not present

## 2016-05-16 DIAGNOSIS — Z7982 Long term (current) use of aspirin: Secondary | ICD-10-CM

## 2016-05-16 DIAGNOSIS — E119 Type 2 diabetes mellitus without complications: Secondary | ICD-10-CM | POA: Diagnosis present

## 2016-05-16 DIAGNOSIS — Z6830 Body mass index (BMI) 30.0-30.9, adult: Secondary | ICD-10-CM

## 2016-05-16 DIAGNOSIS — E1151 Type 2 diabetes mellitus with diabetic peripheral angiopathy without gangrene: Secondary | ICD-10-CM | POA: Diagnosis present

## 2016-05-16 DIAGNOSIS — I5020 Unspecified systolic (congestive) heart failure: Secondary | ICD-10-CM | POA: Diagnosis not present

## 2016-05-16 DIAGNOSIS — E876 Hypokalemia: Secondary | ICD-10-CM | POA: Diagnosis not present

## 2016-05-16 DIAGNOSIS — G4733 Obstructive sleep apnea (adult) (pediatric): Secondary | ICD-10-CM | POA: Diagnosis present

## 2016-05-16 DIAGNOSIS — R0600 Dyspnea, unspecified: Secondary | ICD-10-CM | POA: Diagnosis not present

## 2016-05-16 DIAGNOSIS — E039 Hypothyroidism, unspecified: Secondary | ICD-10-CM | POA: Diagnosis present

## 2016-05-16 DIAGNOSIS — Z885 Allergy status to narcotic agent status: Secondary | ICD-10-CM

## 2016-05-16 DIAGNOSIS — E1142 Type 2 diabetes mellitus with diabetic polyneuropathy: Secondary | ICD-10-CM | POA: Diagnosis not present

## 2016-05-16 DIAGNOSIS — I5023 Acute on chronic systolic (congestive) heart failure: Secondary | ICD-10-CM | POA: Diagnosis not present

## 2016-05-16 DIAGNOSIS — Z79899 Other long term (current) drug therapy: Secondary | ICD-10-CM

## 2016-05-16 DIAGNOSIS — R0789 Other chest pain: Secondary | ICD-10-CM | POA: Diagnosis not present

## 2016-05-16 DIAGNOSIS — E1159 Type 2 diabetes mellitus with other circulatory complications: Secondary | ICD-10-CM | POA: Diagnosis present

## 2016-05-16 DIAGNOSIS — E663 Overweight: Secondary | ICD-10-CM | POA: Diagnosis present

## 2016-05-16 DIAGNOSIS — Z833 Family history of diabetes mellitus: Secondary | ICD-10-CM

## 2016-05-16 DIAGNOSIS — R7989 Other specified abnormal findings of blood chemistry: Secondary | ICD-10-CM | POA: Diagnosis not present

## 2016-05-16 DIAGNOSIS — Z8673 Personal history of transient ischemic attack (TIA), and cerebral infarction without residual deficits: Secondary | ICD-10-CM

## 2016-05-16 HISTORY — DX: Cardiomyopathy, unspecified: I42.9

## 2016-05-16 LAB — BASIC METABOLIC PANEL
Anion gap: 6 (ref 5–15)
BUN: 13 mg/dL (ref 6–20)
CO2: 24 mmol/L (ref 22–32)
Calcium: 8.2 mg/dL — ABNORMAL LOW (ref 8.9–10.3)
Chloride: 105 mmol/L (ref 101–111)
Creatinine, Ser: 0.7 mg/dL (ref 0.61–1.24)
GFR calc Af Amer: >60 mL/min (ref >60)
GFR calc non Af Amer: >60 mL/min (ref >60)
Glucose, Bld: 133 mg/dL — ABNORMAL HIGH (ref 65–99)
Potassium: 3.6 mmol/L (ref 3.5–5.1)
Sodium: 135 mmol/L (ref 135–145)

## 2016-05-16 LAB — CBC WITH DIFFERENTIAL/PLATELET
Basophils Absolute: 0 10*3/uL (ref 0.0–0.1)
Basophils Relative: 0 %
Eosinophils Absolute: 0.1 10*3/uL (ref 0.0–0.7)
Eosinophils Relative: 1 %
HCT: 32.5 % — ABNORMAL LOW (ref 39.0–52.0)
Hemoglobin: 10.6 g/dL — ABNORMAL LOW (ref 13.0–17.0)
Lymphocytes Relative: 6 %
Lymphs Abs: 0.7 10*3/uL (ref 0.7–4.0)
MCH: 31.4 pg (ref 26.0–34.0)
MCHC: 32.6 g/dL (ref 30.0–36.0)
MCV: 96.2 fL (ref 78.0–100.0)
Monocytes Absolute: 0.7 10*3/uL (ref 0.1–1.0)
Monocytes Relative: 6 %
Neutro Abs: 10.8 10*3/uL — ABNORMAL HIGH (ref 1.7–7.7)
Neutrophils Relative %: 87 %
Platelets: 215 10*3/uL (ref 150–400)
RBC: 3.38 MIL/uL — ABNORMAL LOW (ref 4.22–5.81)
RDW: 14.4 % (ref 11.5–15.5)
WBC: 12.3 10*3/uL — ABNORMAL HIGH (ref 4.0–10.5)

## 2016-05-16 LAB — URINALYSIS, ROUTINE W REFLEX MICROSCOPIC
Bilirubin Urine: NEGATIVE
Glucose, UA: >1000 mg/dL — AB
Ketones, ur: NEGATIVE mg/dL
Leukocytes, UA: NEGATIVE
Nitrite: NEGATIVE
Protein, ur: NEGATIVE mg/dL
Specific Gravity, Urine: 1.015 (ref 1.005–1.030)
pH: 7.5 (ref 5.0–8.0)

## 2016-05-16 LAB — URINE MICROSCOPIC-ADD ON: WBC, UA: NONE SEEN WBC/hpf (ref 0–5)

## 2016-05-16 LAB — BRAIN NATRIURETIC PEPTIDE: B Natriuretic Peptide: 944 pg/mL — ABNORMAL HIGH (ref 0.0–100.0)

## 2016-05-16 LAB — TROPONIN I: Troponin I: 0.03 ng/mL (ref <0.03)

## 2016-05-16 MED ORDER — NITROGLYCERIN 0.4 MG/SPRAY TL SOLN
1.0000 | Freq: Once | Status: DC
Start: 2016-05-16 — End: 2016-05-16

## 2016-05-16 MED ORDER — NITROGLYCERIN 0.4 MG SL SUBL: 0.4000 mg | SUBLINGUAL_TABLET | Freq: Once | SUBLINGUAL | Status: DC

## 2016-05-16 NOTE — ED Notes (Signed)
Lab called with critical troponin of 0.03 Dr Tomi Bamberger informed

## 2016-05-16 NOTE — ED Triage Notes (Signed)
Patient states right lower leg surgery 6 weeks ago. C/o shortness of breath that started this morning-productive cough.

## 2016-05-16 NOTE — ED Provider Notes (Signed)
[facility] DEPT Provider Note   CSN: [id number] Arrival date & time: 05/16/16  2051  By signing my name below, I, Irene Pap, attest that this documentation has been prepared under the direction and in the presence of Rolland Porter, MD. Electronically Signed: Irene Pap, ED Scribe. 05/16/16. 11:19 PM.  Time Seen 23:09 PM  History   Chief Complaint Chief Complaint  Patient presents with  . Shortness of Breath   The history is provided by the patient. No language interpreter was used.   HPI Comments: [name] is a 70 y.o. male with a hx of DM, HTN, obstructive sleep apnea, PAD, SOB, and stroke who presents to the Emergency Department complaining of gradually resolving, intermittent SOB onset 15 hours ago that started around 7:30 this morning. He reports associated productive cough with yellow-brown sputum, nausea, and pressured, non-radiating, central chest pain that he currently rates 1/10. He describes the pain earlier "like an explosion was going to happen". Pt states that he woke up with the chest pain this morning, after not sleeping well last night b/o restless leg syndrome. He reports worsening SOB tonight when he went to bed no matter what position changes he made. Pt states that he put his CPAP machine on while in bed but had no relief. He said that he could barely breathe when he arrived to the ED, but is much better now without treatment. He has never had this before.  Pt is s/p right lower leg surgery 6 weeks ago performed by Dr. Prudy Feeler at [place]. Pt had a diabetic ulcer on his foot. Pt is on Plavix and 81 mg aspirin. Pt denies fever, chills, wheezing, vomiting or diaphoresis. Pt does not smoke or drink.  PCP: [name], MD  Past Medical History:  Diagnosis Date  . Arthritis   . Carotid artery occlusion    left s/p CEA  . Diabetes mellitus   . Hypertension   . Hypothyroidism   . Obstructive sleep apnea   . Peripheral arterial disease (HCC)    nonhealing ulcers bilaterally on each great toe  . PONV (postoperative nausea and vomiting)   . Restless leg syndrome   . Shortness of breath   . Stroke [facility]) March 08, 2012    Patient Active Problem List   Diagnosis Date Noted  . CHF (congestive heart failure) ([place]) 05/17/2016  . CAP (community acquired pneumonia) 04/11/2016  . Elevated troponin 04/11/2016  . Nonhealing skin ulcer ([place]) 10/28/2014  . Bilateral carotid artery disease ([place]) 07/08/2014  . Critical lower limb ischemia 02/28/2014  . Obstructive sleep apnea 02/12/2014  . Aftercare following surgery of the circulatory system, [place] 12/04/2013  . Restless leg syndrome 10/23/2013  . Obesity (BMI 30.0-34.9) 10/23/2013  . Hyperlipidemia 07/16/2013  . Peripheral arterial disease ([place]) 07/16/2013  . History of stroke June 2013 03/14/2012  . Hypertension 03/14/2012  . Type 2 diabetes mellitus with circulatory disorder ([place]) 03/14/2012  . Peripheral neuropathy ([place]) 03/14/2012  . Cardiomyopathy- EF NL 5/14 03/14/2012  . ARTHRITIS, RIGHT FOOT 06/26/2008    Past Surgical History:  Procedure Laterality Date  . ANGIOPLASTY  02/28/14   diamond back orbital rotational atherectomy of Rt. tibial  . BACK SURGERY    . CERVICAL FUSION    . ENDARTERECTOMY Left 11/29/2013   Procedure: ENDARTERECTOMY CAROTID;  Surgeon: Serafina Mitchell, MD;  Location: [facility];  Service: Vascular;  Laterality: Left;  . EYE SURGERY    . FOOT SURGERY    . Lower ext duplex doppler  03/14/14   Rt ABI 1.2  . LOWER EXTREMITY ANGIOGRAM Bilateral 02/18/2014   Procedure: LOWER EXTREMITY ANGIOGRAM;  Surgeon: [name], MD;  Location: [facility] CATH LAB;  Service: Cardiovascular;  Laterality: Bilateral;  . LOWER EXTREMITY ANGIOGRAM N/A 10/31/2014   Procedure: LOWER EXTREMITY ANGIOGRAM;  Surgeon: [name], MD;  Location: [facility] CATH LAB;  Service: Cardiovascular;  Laterality: N/A;  . PV angiogram  02/18/2014   tibial vessel diseas bil.  [name] SPINE SURGERY    . tendon  achillies lengthing and sesamoid         Home Medications    Prior to Admission medications   Medication Sig Start Date End Date Taking? Authorizing Provider  amLODipine (NORVASC) 10 MG tablet Take 10 mg by mouth daily.  05/28/14   Historical Provider, MD  aspirin EC 81 MG tablet Take 1 tablet (81 mg total) by mouth daily. Patient taking differently: Take 325 mg by mouth daily.  11/26/14   Erlene Quan, PA-C  b complex vitamins tablet Take 1 tablet by mouth daily.    Historical Provider, MD  cefUROXime (CEFTIN) 500 MG tablet Take 1 tablet (500 mg total) by mouth 2 (two) times daily with a meal. 04/13/16   [name], MD  clopidogrel (PLAVIX) 75 MG tablet Take 75 mg by mouth daily.  06/16/13   Historical Provider, MD  doxycycline (VIBRA-TABS) 100 MG tablet Take 1 tablet (100 mg total) by mouth every 12 (twelve) hours. 04/13/16   [name], MD  DULoxetine (CYMBALTA) 20 MG capsule Take 1 capsule by mouth 2 (two) times daily. 07/30/14   Historical Provider, MD  Empagliflozin-Linagliptin 10-5 MG TABS Take 1 tablet by mouth daily.    Historical Provider, MD  gabapentin (NEURONTIN) 600 MG tablet Take 600 mg by mouth 4 (four) times daily.     Historical Provider, MD  glimepiride (AMARYL) 4 MG tablet Take 4 mg by mouth daily with breakfast.    Historical Provider, MD  HYDROmorphone (DILAUDID) 2 MG tablet Take 1 tablet by mouth every 4 (four) hours as needed for pain. 04/09/16   Historical Provider, MD  levothyroxine (SYNTHROID, LEVOTHROID) 25 MCG tablet Take 50 mcg by mouth daily.  06/08/13   Historical Provider, MD  Omega 3-6-9 Fatty Acids (OMEGA 3-6-9 COMPLEX) CAPS Take 1 capsule by mouth 2 (two) times daily.    Historical Provider, MD  pravastatin (PRAVACHOL) 40 MG tablet Take 40 mg by mouth every morning.  10/06/12   Historical Provider, MD  rOPINIRole (REQUIP) 0.5 MG tablet Take 1 tablet (0.5 mg total) by mouth at bedtime. 10/29/13   Erlene Quan, PA-C  saccharomyces boulardii (FLORASTOR) 250 MG  capsule Take 1 capsule (250 mg total) by mouth 2 (two) times daily. 11/01/14   Janece Canterbury, MD    Family History Family History  Problem Relation Age of Onset  . Heart disease Mother   . Hypertension Mother   . Heart attack Mother   . Hypertension Father   . Diabetes Father   . Diabetes Son   . Heart disease Son   . Hypertension Son     Social History Social History  Substance Use Topics  . Smoking status: Former Smoker    Years: 1.00    Types: Pipe    Quit date: 07/08/1977  . Smokeless tobacco: Never Used  . Alcohol use No  CPAP at night Lives at home Lives with spouse   Allergies   Codeine and Tramadol   Review of Systems Review  of Systems  Constitutional: Negative for chills, diaphoresis and fever.  Respiratory: Positive for cough and shortness of breath. Negative for wheezing.   Cardiovascular: Positive for chest pain.  Gastrointestinal: Positive for nausea. Negative for vomiting.  All other systems reviewed and are negative.    Physical Exam Updated Vital Signs BP 149/99   Pulse 99   Temp 98.1 F (36.7 C) (Oral)   Resp 17   Ht 6' (1.829 m)   Wt 232 lb (105.2 kg)   SpO2 97%   BMI 31.46 kg/m   Vital signs normal    Physical Exam  Constitutional: He is oriented to person, place, and time. He appears well-developed and well-nourished.  Non-toxic appearance. He does not appear ill. No distress.  HENT:  Head: Normocephalic and atraumatic.  Right Ear: External ear normal.  Left Ear: External ear normal.  Nose: Nose normal. No mucosal edema or rhinorrhea.  Mouth/Throat: Oropharynx is clear and moist and mucous membranes are normal. No dental abscesses or uvula swelling.  Eyes: Conjunctivae and EOM are normal. Pupils are equal, round, and reactive to light.  Neck: Normal range of motion and full passive range of motion without pain. Neck supple.  Cardiovascular: Normal rate, regular rhythm and normal heart sounds.  Exam reveals no gallop and no  friction rub.   No murmur heard. Pulmonary/Chest: Effort normal and breath sounds normal. No respiratory distress. He has no wheezes. He has no rhonchi. He has no rales. He exhibits no tenderness and no crepitus.  Abdominal: Soft. Normal appearance and bowel sounds are normal. He exhibits no distension. There is no tenderness. There is no rebound and no guarding.  Musculoskeletal: Normal range of motion. He exhibits no edema or tenderness.  Cam Walker on RLE; using a cane to ambulate; Moves all extremities well.   Neurological: He is alert and oriented to person, place, and time. He has normal strength. No cranial nerve deficit.  Skin: Skin is warm, dry and intact. No rash noted. No erythema. No pallor.  Psychiatric: He has a normal mood and affect. His speech is normal and behavior is normal. His mood appears not anxious.  Nursing note and vitals reviewed.    ED Treatments / Results  DIAGNOSTIC STUDIES: Oxygen Saturation is 97% on RA, normal by my interpretation.    Labs (all labs ordered are listed, but only abnormal results are displayed) Results for orders placed or performed during the hospital encounter of 05/16/16  CBC with Differential  Result Value Ref Range   WBC 12.3 (H) 4.0 - 10.5 K/uL   RBC 3.38 (L) 4.22 - 5.81 MIL/uL   Hemoglobin 10.6 (L) 13.0 - 17.0 g/dL   HCT 32.5 (L) 39.0 - 52.0 %   MCV 96.2 78.0 - 100.0 fL   MCH 31.4 26.0 - 34.0 pg   MCHC 32.6 30.0 - 36.0 g/dL   RDW 14.4 11.5 - 15.5 %   Platelets 215 150 - 400 K/uL   Neutrophils Relative % 87 %   Neutro Abs 10.8 (H) 1.7 - 7.7 K/uL   Lymphocytes Relative 6 %   Lymphs Abs 0.7 0.7 - 4.0 K/uL   Monocytes Relative 6 %   Monocytes Absolute 0.7 0.1 - 1.0 K/uL   Eosinophils Relative 1 %   Eosinophils Absolute 0.1 0.0 - 0.7 K/uL   Basophils Relative 0 %   Basophils Absolute 0.0 0.0 - 0.1 K/uL  Basic metabolic panel  Result Value Ref Range   Sodium 135 135 - 145 mmol/L  Potassium 3.6 3.5 - 5.1 mmol/L   Chloride  105 101 - 111 mmol/L   CO2 24 22 - 32 mmol/L   Glucose, Bld 133 (H) 65 - 99 mg/dL   BUN 13 6 - 20 mg/dL   Creatinine, Ser 0.70 0.61 - 1.24 mg/dL   Calcium 8.2 (L) 8.9 - 10.3 mg/dL   GFR calc non Af Amer >60 >60 mL/min   GFR calc Af Amer >60 >60 mL/min   Anion gap 6 5 - 15  Troponin I  Result Value Ref Range   Troponin I 0.03 (HH) <0.03 ng/mL  Urinalysis, Routine w reflex microscopic (not at [facility])  Result Value Ref Range   Color, Urine YELLOW YELLOW   APPearance CLEAR CLEAR   Specific Gravity, Urine 1.015 1.005 - 1.030   pH 7.5 5.0 - 8.0   Glucose, UA >1000 (A) NEGATIVE mg/dL   Hgb urine dipstick TRACE (A) NEGATIVE   Bilirubin Urine NEGATIVE NEGATIVE   Ketones, ur NEGATIVE NEGATIVE mg/dL   Protein, ur NEGATIVE NEGATIVE mg/dL   Nitrite NEGATIVE NEGATIVE   Leukocytes, UA NEGATIVE NEGATIVE  Urine microscopic-add on  Result Value Ref Range   Squamous Epithelial / LPF 0-5 (A) NONE SEEN   WBC, UA NONE SEEN 0 - 5 WBC/hpf   RBC / HPF 0-5 0 - 5 RBC/hpf   Bacteria, UA RARE (A) NONE SEEN  Brain natriuretic peptide  Result Value Ref Range   B Natriuretic Peptide 944.0 (H) 0.0 - 100.0 pg/mL  Protime-INR  Result Value Ref Range   Prothrombin Time 13.9 11.4 - 15.2 seconds   INR 1.07   APTT  Result Value Ref Range   aPTT 44 (H) 24 - 36 seconds  Troponin I  Result Value Ref Range   Troponin I 0.03 (HH) <0.03 ng/mL   Laboratory interpretation all normal except persistently elevated troponin since July, worsening anemia, leukocytosis, elevated BNP    EKG  EKG Interpretation  Date/Time:  Sunday May 16 2016 21:00:06 EDT Ventricular Rate:  104 PR Interval:    QRS Duration: 98 QT Interval:  340 QTC Calculation: 448 R Axis:   67 Text Interpretation:  Sinus tachycardia Borderline repolarization abnormality No significant change since last tracing 12 Apr 2016 Confirmed by Ifeanyichukwu Wickham  MD-I, Kassius Battiste ([id number]) on 05/16/2016 11:04:58 PM       Radiology Dg Chest 2 View  Result Date:  05/16/2016 CLINICAL DATA:  Right lower leg surgery 6 weeks ago. Shortness of breath starting this morning. Productive cough. EXAM: CHEST  2 VIEW COMPARISON:  04/11/2016 FINDINGS: Cardiac enlargement with pulmonary vascular congestion and interstitial edema. Blunting of the costophrenic angles consistent with small effusions. No pneumothorax. Mediastinal contours appear intact. Degenerative changes in the spine. IMPRESSION: Congestive changes with cardiac enlargement, pulmonary vascular congestion, interstitial edema, and small bilateral pleural effusions. Electronically Signed   By: Lucienne Capers M.D.   On: 05/16/2016 22:06   Ct Angio Chest Pe W/cm &/or Wo Cm  Result Date: 05/17/2016 CLINICAL DATA:  Dyspnea and chest pain, onset this morning. EXAM: CT ANGIOGRAPHY CHEST WITH CONTRAST TECHNIQUE: Multidetector CT imaging of the chest was performed using the standard protocol during bolus administration of intravenous contrast. Multiplanar CT image reconstructions and MIPs were obtained to evaluate the vascular anatomy. CONTRAST:  100 mL Isovue 370 intravenous COMPARISON:  Radiograph 05/16/2016 FINDINGS: Cardiovascular: There is good opacification of the pulmonary arteries. There is no pulmonary embolism. The thoracic aorta is normal in caliber and intact. There is mild atherosclerotic calcification of  the thoracic aorta and coronary arteries. Lungs: There is interlobular septal thickening consistent with interstitial fluid. There are minimal atelectatic appearing lung base opacities adjacent to the small pleural effusions. Minimal nodularity is present bilaterally, measuring up to 6 mm, the largest centered on the fissures. These are more likely benign. Central airways: Patent Effusions: Small pleural effusions bilaterally. Lymphadenopathy: Nonspecific nodes in the hila and mediastinum. Esophagus: Unremarkable Upper abdomen: Incompletely imaged hypodense upper pole left renal lesion measuring at least 2.4 cm  Musculoskeletal: No significant skeletal lesion. Moderate degenerative thoracic spine changes. Review of the MIP images confirms the above findings. IMPRESSION: 1. Negative for acute pulmonary embolism. 2. Atherosclerotic calcification of the thoracic aorta and coronary arteries. 3. Interstitial fluid, small bilateral pleural effusions. Probable CHF. 4. **An incidental finding of potential clinical significance has been found. Scattered noncalcified nodules measuring up to 6 mm, indeterminate but more likely benign. Non-contrast chest CT at 3-6 months is recommended. If the nodules are stable at time of repeat CT, then future CT at 18-24 months (from today's scan) is considered optional for low-risk patients, but is recommended for high-risk patients. This recommendation follows the consensus statement: Guidelines for Management of Incidental Pulmonary Nodules Detected on CT Images:From the Fleischner Society 2017; published online before print (10.1148/radiol.SG:5268862).** 5. **An incidental finding of potential clinical significance has been found. There is a hypodense upper pole left renal lesion which is incompletely imaged, extending beyond the inferior edge of this study. It measures at least 2.4 cm. This could merely represent a simple cyst but characterization with ultrasound would be advisable to exclude a significant renal lesion.** Electronically Signed   By: Andreas Newport M.D.   On: 05/17/2016 02:03    Procedures Procedures (including critical care time)  Medications Ordered in ED Medications  nitroGLYCERIN (NITROSTAT) SL tablet 0.4 mg (0.4 mg Sublingual Not Given 05/17/16 0251)  iopamidol (ISOVUE-370) 76 % injection 100 mL (100 mLs Intravenous Contrast Given 05/17/16 0046)  furosemide (LASIX) injection 60 mg (60 mg Intravenous Given 05/17/16 0310)     Initial Impression / Assessment and Plan / ED Course   11:19 PM-Discussed treatment plan which includes labs and CT angiogram scan with  pt at bedside and pt agreed to plan.   I have reviewed the triage vital signs and the nursing notes.  Pertinent labs & imaging results that were available during my care of the patient were reviewed by me and considered in my medical decision making (see chart for details).  Clinical Course   Pt refused NTG for his pain. He no longer had SOB and his lungs were clear. Since he was getting IV contrast waited to give him IV lasix to prevent renal injury.   After reviewing patient's CT scan he was given Lasix 60 mg IV. His delta troponin is unchanged. It appears he has new onset congestive heart failure.  Recheck at 2:55 AM I talked to patient about his test results. He states he's never had any cardiac problems, he does have a cardiologist, Dr. Gwenlyn Found however that was for a vascular procedure. He states he's never had echocardiogram. We discussed he has new onset congestive heart failure and should be admitted and he and his wife are agreeable.  03:15 AM Dr Darrick Meigs, admit to tele, obs, Dr Luan Pulling attending   Final Clinical Impressions(s) / ED Diagnoses   Final diagnoses:  Shortness of breath  Chest pain, unspecified chest pain type  Congestive heart failure, unspecified congestive heart failure chronicity, unspecified congestive heart failure  type [facility])   Plan admission  Rolland Porter, MD, FACEP   I personally performed the services described in this documentation, which was scribed in my presence. The recorded information has been reviewed and considered.  Rolland Porter, MD, Barbette Or, MD 05/17/16 [phone]

## 2016-05-17 ENCOUNTER — Emergency Department (HOSPITAL_COMMUNITY): Payer: PPO

## 2016-05-17 ENCOUNTER — Encounter (HOSPITAL_COMMUNITY): Payer: Self-pay | Admitting: *Deleted

## 2016-05-17 DIAGNOSIS — I10 Essential (primary) hypertension: Secondary | ICD-10-CM | POA: Diagnosis not present

## 2016-05-17 DIAGNOSIS — E785 Hyperlipidemia, unspecified: Secondary | ICD-10-CM | POA: Diagnosis not present

## 2016-05-17 DIAGNOSIS — G2581 Restless legs syndrome: Secondary | ICD-10-CM | POA: Diagnosis not present

## 2016-05-17 DIAGNOSIS — R079 Chest pain, unspecified: Secondary | ICD-10-CM

## 2016-05-17 DIAGNOSIS — R911 Solitary pulmonary nodule: Secondary | ICD-10-CM | POA: Diagnosis present

## 2016-05-17 DIAGNOSIS — I5033 Acute on chronic diastolic (congestive) heart failure: Secondary | ICD-10-CM | POA: Diagnosis not present

## 2016-05-17 DIAGNOSIS — I509 Heart failure, unspecified: Secondary | ICD-10-CM

## 2016-05-17 DIAGNOSIS — E1151 Type 2 diabetes mellitus with diabetic peripheral angiopathy without gangrene: Secondary | ICD-10-CM

## 2016-05-17 DIAGNOSIS — R0600 Dyspnea, unspecified: Secondary | ICD-10-CM | POA: Diagnosis not present

## 2016-05-17 HISTORY — DX: Chest pain, unspecified: R07.9

## 2016-05-17 HISTORY — DX: Solitary pulmonary nodule: R91.1

## 2016-05-17 LAB — GLUCOSE, CAPILLARY
Glucose-Capillary: 184 mg/dL — ABNORMAL HIGH (ref 65–99)
Glucose-Capillary: 118 mg/dL — ABNORMAL HIGH (ref 65–99)
Glucose-Capillary: 127 mg/dL — ABNORMAL HIGH (ref 65–99)
Glucose-Capillary: 159 mg/dL — ABNORMAL HIGH (ref 65–99)

## 2016-05-17 LAB — CBC
HCT: 34.1 % — ABNORMAL LOW (ref 39.0–52.0)
Hemoglobin: 11 g/dL — ABNORMAL LOW (ref 13.0–17.0)
MCH: 31.5 pg (ref 26.0–34.0)
MCHC: 32.3 g/dL (ref 30.0–36.0)
MCV: 97.7 fL (ref 78.0–100.0)
Platelets: 235 10*3/uL (ref 150–400)
RBC: 3.49 MIL/uL — ABNORMAL LOW (ref 4.22–5.81)
RDW: 14.6 % (ref 11.5–15.5)
WBC: 10.9 10*3/uL — ABNORMAL HIGH (ref 4.0–10.5)

## 2016-05-17 LAB — TROPONIN I
Troponin I: 0.03 ng/mL (ref <0.03)
Troponin I: 0.04 ng/mL (ref <0.03)
Troponin I: 0.04 ng/mL (ref <0.03)
Troponin I: 0.03 ng/mL (ref <0.03)

## 2016-05-17 LAB — CREATININE, SERUM
Creatinine, Ser: 0.88 mg/dL (ref 0.61–1.24)
GFR calc Af Amer: >60 mL/min (ref >60)
GFR calc non Af Amer: >60 mL/min (ref >60)

## 2016-05-17 LAB — PROTIME-INR
INR: 1.07
Prothrombin Time: 13.9 seconds (ref 11.4–15.2)

## 2016-05-17 LAB — APTT: aPTT: 44 seconds — ABNORMAL HIGH (ref 24–36)

## 2016-05-17 MED ORDER — CLOPIDOGREL BISULFATE 75 MG PO TABS
75.0000 mg | ORAL_TABLET | Freq: Every day | ORAL | Status: DC
  Administered 2016-05-17 – 2016-05-21 (×5): 75 mg via ORAL
  Filled 2016-05-17 (×5): qty 1

## 2016-05-17 MED ORDER — INSULIN ASPART 100 UNIT/ML ~~LOC~~ SOLN
0.0000 [IU] | Freq: Three times a day (TID) | SUBCUTANEOUS | Status: DC
  Administered 2016-05-17: 1 [IU] via SUBCUTANEOUS
  Administered 2016-05-17: 2 [IU] via SUBCUTANEOUS
  Administered 2016-05-18 (×2): 1 [IU] via SUBCUTANEOUS

## 2016-05-17 MED ORDER — DULOXETINE HCL 20 MG PO CPEP
20.0000 mg | ORAL_CAPSULE | Freq: Two times a day (BID) | ORAL | Status: DC
  Administered 2016-05-17 – 2016-05-21 (×8): 20 mg via ORAL
  Filled 2016-05-17 (×11): qty 1

## 2016-05-17 MED ORDER — ACETAMINOPHEN 325 MG PO TABS
650.0000 mg | ORAL_TABLET | ORAL | Status: DC | PRN
Start: 2016-05-17 — End: 2016-05-21

## 2016-05-17 MED ORDER — NITROGLYCERIN 0.4 MG SL SUBL: 0.4000 mg | SUBLINGUAL_TABLET | SUBLINGUAL | Status: DC | PRN

## 2016-05-17 MED ORDER — ASPIRIN EC 325 MG PO TBEC
325.0000 mg | DELAYED_RELEASE_TABLET | Freq: Every day | ORAL | Status: DC
  Administered 2016-05-17: 325 mg via ORAL
  Filled 2016-05-17: qty 1

## 2016-05-17 MED ORDER — GABAPENTIN 300 MG PO CAPS
600.0000 mg | ORAL_CAPSULE | Freq: Four times a day (QID) | ORAL | Status: DC
  Administered 2016-05-17 – 2016-05-21 (×16): 600 mg via ORAL
  Filled 2016-05-17 (×16): qty 2

## 2016-05-17 MED ORDER — ENOXAPARIN SODIUM 40 MG/0.4ML ~~LOC~~ SOLN
40.0000 mg | SUBCUTANEOUS | Status: DC
  Administered 2016-05-17 – 2016-05-18 (×2): 40 mg via SUBCUTANEOUS
  Filled 2016-05-17 (×3): qty 0.4

## 2016-05-17 MED ORDER — IOPAMIDOL (ISOVUE-370) INJECTION 76%
100.0000 mL | Freq: Once | INTRAVENOUS | Status: AC | PRN
  Administered 2016-05-17: 100 mL via INTRAVENOUS

## 2016-05-17 MED ORDER — ASPIRIN EC 81 MG PO TBEC
81.0000 mg | DELAYED_RELEASE_TABLET | Freq: Every day | ORAL | Status: DC
  Administered 2016-05-17 – 2016-05-21 (×5): 81 mg via ORAL
  Filled 2016-05-17 (×4): qty 1

## 2016-05-17 MED ORDER — AMLODIPINE BESYLATE 10 MG PO TABS
10.0000 mg | ORAL_TABLET | Freq: Every day | ORAL | Status: DC
  Administered 2016-05-17 – 2016-05-20 (×4): 10 mg via ORAL
  Filled 2016-05-17: qty 1
  Filled 2016-05-17 (×2): qty 2
  Filled 2016-05-17: qty 1

## 2016-05-17 MED ORDER — FUROSEMIDE 10 MG/ML IJ SOLN
60.0000 mg | Freq: Two times a day (BID) | INTRAMUSCULAR | Status: DC
  Administered 2016-05-17 – 2016-05-18 (×3): 60 mg via INTRAVENOUS
  Filled 2016-05-17 (×3): qty 6

## 2016-05-17 MED ORDER — FUROSEMIDE 10 MG/ML IJ SOLN
60.0000 mg | Freq: Once | INTRAMUSCULAR | Status: AC
  Administered 2016-05-17: 60 mg via INTRAVENOUS
  Filled 2016-05-17: qty 6

## 2016-05-17 MED ORDER — PRAVASTATIN SODIUM 40 MG PO TABS
40.0000 mg | ORAL_TABLET | ORAL | Status: DC
Start: 2016-05-17 — End: 2016-05-21
  Administered 2016-05-17 – 2016-05-21 (×5): 40 mg via ORAL
  Filled 2016-05-17 (×5): qty 1

## 2016-05-17 MED ORDER — ONDANSETRON HCL 4 MG/2ML IJ SOLN
4.0000 mg | Freq: Four times a day (QID) | INTRAMUSCULAR | Status: DC | PRN
  Administered 2016-05-19: 4 mg via INTRAVENOUS
  Filled 2016-05-17: qty 2

## 2016-05-17 MED ORDER — SACCHAROMYCES BOULARDII 250 MG PO CAPS
250.0000 mg | ORAL_CAPSULE | Freq: Two times a day (BID) | ORAL | Status: DC
Start: 2016-05-17 — End: 2016-05-21
  Administered 2016-05-17 – 2016-05-21 (×9): 250 mg via ORAL
  Filled 2016-05-17 (×10): qty 1

## 2016-05-17 MED ORDER — METOPROLOL TARTRATE 12.5 MG HALF TABLET
12.5000 mg | ORAL_TABLET | Freq: Two times a day (BID) | ORAL | Status: DC
Start: 2016-05-17 — End: 2016-05-20
  Administered 2016-05-17 – 2016-05-20 (×7): 12.5 mg via ORAL
  Filled 2016-05-17 (×7): qty 1

## 2016-05-17 MED ORDER — ROPINIROLE HCL 0.5 MG PO TABS
0.5000 mg | ORAL_TABLET | Freq: Every day | ORAL | Status: DC
  Administered 2016-05-17 – 2016-05-20 (×4): 0.5 mg via ORAL
  Filled 2016-05-17 (×4): qty 1

## 2016-05-17 MED ORDER — FUROSEMIDE 10 MG/ML IJ SOLN: 20.0000 mg | Freq: Two times a day (BID) | INTRAMUSCULAR | Status: DC

## 2016-05-17 MED ORDER — LEVOTHYROXINE SODIUM 50 MCG PO TABS
50.0000 ug | ORAL_TABLET | Freq: Every day | ORAL | Status: DC
  Administered 2016-05-17 – 2016-05-18 (×2): 50 ug via ORAL
  Filled 2016-05-17 (×2): qty 1

## 2016-05-17 NOTE — Progress Notes (Signed)
Patient refused CPAP for tonight. Placed patient on 2 lpm nasal cannula. No unit in room at this time.

## 2016-05-17 NOTE — Care Management Note (Signed)
Case Management Note  Patient Details  Name: [name] MRN: [id number] Date of Birth: Nov 11, 1945  Subjective/Objective: Patient is from home, ind with ADL's. He drives himself to appointments, has a PCP and insurance, reports no issues.                    Action/Plan: Anticipate DC home with self care.    Expected Discharge Date:  05/18/16               Expected Discharge Plan:  Home/Self Care  In-House Referral:  NA  Discharge planning Services  CM Consult  Post Acute Care Choice:  NA Choice offered to:  NA  DME Arranged:    DME Agency:     HH Arranged:    HH Agency:     Status of Service:  Completed, signed off  If discussed at [employer] of Stay Meetings, dates discussed:    Additional Comments:  Leah Thornberry, Chauncey Reading, RN 05/17/2016, 11:33 AM

## 2016-05-17 NOTE — H&P (Signed)
TRH H&P   Patient Demographics:    [name], is a 70 y.o. male  MRN: [id number]  DOB - 1946-01-22  Admit Date - 05/16/2016  Outpatient Primary MD for the patient is [name], MD  Referring MD/NP/PA: Dr. Tomi Bamberger  Patient coming from: Home  Chief Complaint  Patient presents with  . Shortness of Breath      HPI:    [name]  is a 70 y.o. male, History of diabetes mellitus, hypertension, obstructive sleep apnea, stroke who came to the ED with chest pain which started around 7:30 this morning this was associated with shortness of breath, felt like pressure in the chest. He describes the pain as "an explosion was going to happen". Patient says that the pain and shortness of breath he's off as the day progressed but at nighttime when he was about to lay down he felt uncomfortable and could not be on any side. When shortness of breath became worse he came to the ED for further evaluation. Patient status post surgery for diabetic foot ulcer in right lower extremity by Dr. Prudy Feeler at [place].  In the ED chest pain has resolved, patient found to have elevated BNP 944, mild elevation of troponin 0.03. Patient given 1 dose of Lasix 60 mg IV. CTA chest was done which was negative for PE He does not have history of atherosclerotic heart disease, had stroke, status post left carotid endarterectomy   Review of systems:    In addition to the HPI above,  No Fever-chills, No Headache, No changes with Vision or hearing, No problems swallowing food or Liquids, + Orthopnea No Abdominal pain, No Nausea or Vomiting, bowel movements are regular, No Blood in stool or Urine, No dysuria, No new skin rashes or bruises, No new joints pains-aches,  No new weakness, tingling, numbness in any extremity, No recent weight gain or loss, No polyuria, polydypsia or polyphagia, No significant Mental Stressors.  A  full 10 point Review of Systems was done, except as stated above, all other Review of Systems were negative.   With Past History of the following :    Past Medical History:  Diagnosis Date  . Arthritis   . Carotid artery occlusion    left s/p CEA  . Diabetes mellitus   . Hypertension   . Hypothyroidism   . Obstructive sleep apnea   . Peripheral arterial disease (HCC)    nonhealing ulcers bilaterally on each great toe  . PONV (postoperative nausea and vomiting)   . Restless leg syndrome   . Shortness of breath   . Stroke [facility]) March 08, 2012      Past Surgical History:  Procedure Laterality Date  . ANGIOPLASTY  02/28/14   diamond back orbital rotational atherectomy of Rt. tibial  . BACK SURGERY    . CERVICAL FUSION    . ENDARTERECTOMY Left 11/29/2013   Procedure: ENDARTERECTOMY CAROTID;  Surgeon: Serafina Mitchell, MD;  Location: [facility];  Service: Vascular;  Laterality: Left;  . EYE SURGERY    .  FOOT SURGERY    . Lower ext duplex doppler  03/14/14   Rt ABI 1.2  . LOWER EXTREMITY ANGIOGRAM Bilateral 02/18/2014   Procedure: LOWER EXTREMITY ANGIOGRAM;  Surgeon: [name], MD;  Location: [facility] CATH LAB;  Service: Cardiovascular;  Laterality: Bilateral;  . LOWER EXTREMITY ANGIOGRAM N/A 10/31/2014   Procedure: LOWER EXTREMITY ANGIOGRAM;  Surgeon: [name], MD;  Location: [facility] CATH LAB;  Service: Cardiovascular;  Laterality: N/A;  . PV angiogram  02/18/2014   tibial vessel diseas bil.  [name] SPINE SURGERY    . tendon achillies lengthing and sesamoid        Social History:     Social History  Substance Use Topics  . Smoking status: Former Smoker    Years: 1.00    Types: Pipe    Quit date: 07/08/1977  . Smokeless tobacco: Never Used  . Alcohol use No      Family History :     Family History  Problem Relation Age of Onset  . Heart disease Mother   . Hypertension Mother   . Heart attack Mother   . Hypertension Father   . Diabetes Father   . Diabetes Son   . Heart  disease Son   . Hypertension Son       Home Medications:   Prior to Admission medications   Medication Sig Start Date End Date Taking? Authorizing Provider  amLODipine (NORVASC) 10 MG tablet Take 10 mg by mouth daily.  05/28/14   Historical Provider, MD  aspirin EC 81 MG tablet Take 1 tablet (81 mg total) by mouth daily. Patient taking differently: Take 325 mg by mouth daily.  11/26/14   Erlene Quan, PA-C  b complex vitamins tablet Take 1 tablet by mouth daily.    Historical Provider, MD  cefUROXime (CEFTIN) 500 MG tablet Take 1 tablet (500 mg total) by mouth 2 (two) times daily with a meal. 04/13/16   [name], MD  clopidogrel (PLAVIX) 75 MG tablet Take 75 mg by mouth daily.  06/16/13   Historical Provider, MD  doxycycline (VIBRA-TABS) 100 MG tablet Take 1 tablet (100 mg total) by mouth every 12 (twelve) hours. 04/13/16   [name], MD  DULoxetine (CYMBALTA) 20 MG capsule Take 1 capsule by mouth 2 (two) times daily. 07/30/14   Historical Provider, MD  Empagliflozin-Linagliptin 10-5 MG TABS Take 1 tablet by mouth daily.    Historical Provider, MD  gabapentin (NEURONTIN) 600 MG tablet Take 600 mg by mouth 4 (four) times daily.     Historical Provider, MD  glimepiride (AMARYL) 4 MG tablet Take 4 mg by mouth daily with breakfast.    Historical Provider, MD  HYDROmorphone (DILAUDID) 2 MG tablet Take 1 tablet by mouth every 4 (four) hours as needed for pain. 04/09/16   Historical Provider, MD  levothyroxine (SYNTHROID, LEVOTHROID) 25 MCG tablet Take 50 mcg by mouth daily.  06/08/13   Historical Provider, MD  Omega 3-6-9 Fatty Acids (OMEGA 3-6-9 COMPLEX) CAPS Take 1 capsule by mouth 2 (two) times daily.    Historical Provider, MD  pravastatin (PRAVACHOL) 40 MG tablet Take 40 mg by mouth every morning.  10/06/12   Historical Provider, MD  rOPINIRole (REQUIP) 0.5 MG tablet Take 1 tablet (0.5 mg total) by mouth at bedtime. 10/29/13   Erlene Quan, PA-C  saccharomyces boulardii (FLORASTOR) 250 MG  capsule Take 1 capsule (250 mg total) by mouth 2 (two) times daily. 11/01/14   Janece Canterbury, MD  Allergies:     Allergies  Allergen Reactions  . Codeine Nausea And Vomiting  . Tramadol Nausea Only     Physical Exam:   Vitals  Blood pressure (!) 148/102, pulse 102, temperature 98.1 F (36.7 C), temperature source Oral, resp. rate 16, height 6' (1.829 m), weight 105.2 kg (232 lb), SpO2 100 %.   1. General  Caucasian male, in no acute distress and cooperative with exam  2. Normal affect and insight, Awake Alert, Oriented X 3.  3. No F.N deficits, ALL C.Nerves Intact, Strength 5/5 all 4 extremities, Sensation intact all 4 extremities, Plantars down going.  4. Ears and Eyes appear Normal, Conjunctivae clear, PERRLA. Moist Oral Mucosa.  5. Supple Neck, No JVD, No cervical lymphadenopathy appriciated, No Carotid Bruits.  6. Symmetrical Chest wall movement, bibasilar crackles  7. RRR, No Gallops, Rubs or Murmurs, No Parasternal Heave.1+ pitting edema in left lower extremity  8. Positive Bowel Sounds, Abdomen Soft, No tenderness, No organomegaly appriciated,No rebound -guarding or rigidity.  9.  No Cyanosis, Normal Skin Turgor, No Skin Rash or Bruise.  10. Right lower extremity in Cam walker      Data Review:    CBC  Recent Labs Lab 05/16/16 2244  WBC 12.3*  HGB 10.6*  HCT 32.5*  PLT 215  MCV 96.2  MCH 31.4  MCHC 32.6  RDW 14.4  LYMPHSABS 0.7  MONOABS 0.7  EOSABS 0.1  BASOSABS 0.0   ------------------------------------------------------------------------------------------------------------------  Chemistries   Recent Labs Lab 05/16/16 2244  NA 135  K 3.6  CL 105  CO2 24  GLUCOSE 133*  BUN 13  CREATININE 0.70  CALCIUM 8.2*    ------------------------------------------------------------------------------------------------------------------  ------------------------------------------------------------------------------------------------------------------  Coagulation Profile:  Recent Labs Lab 05/16/16 2244  INR 1.07   Cardiac Enzymes:  Recent Labs Lab 05/16/16 2244 05/17/16 0122  TROPONINI 0.03* 0.03*    --------------------------------------------------------------------------------------------------------------- Urine analysis:    Component Value Date/Time   COLORURINE YELLOW 05/16/2016 2124   APPEARANCEUR CLEAR 05/16/2016 2124   LABSPEC 1.015 05/16/2016 2124   PHURINE 7.5 05/16/2016 2124   GLUCOSEU >1000 (A) 05/16/2016 2124   HGBUR TRACE (A) 05/16/2016 2124   BILIRUBINUR NEGATIVE 05/16/2016 2124   KETONESUR NEGATIVE 05/16/2016 2124   PROTEINUR NEGATIVE 05/16/2016 2124   UROBILINOGEN 0.2 11/23/2013 1530   NITRITE NEGATIVE 05/16/2016 2124   LEUKOCYTESUR NEGATIVE 05/16/2016 2124      ----------------------------------------------------------------------------------------------------------------   Imaging Results:    Dg Chest 2 View  Result Date: 05/16/2016 CLINICAL DATA:  Right lower leg surgery 6 weeks ago. Shortness of breath starting this morning. Productive cough. EXAM: CHEST  2 VIEW COMPARISON:  04/11/2016 FINDINGS: Cardiac enlargement with pulmonary vascular congestion and interstitial edema. Blunting of the costophrenic angles consistent with small effusions. No pneumothorax. Mediastinal contours appear intact. Degenerative changes in the spine. IMPRESSION: Congestive changes with cardiac enlargement, pulmonary vascular congestion, interstitial edema, and small bilateral pleural effusions. Electronically Signed   By: Lucienne Capers M.D.   On: 05/16/2016 22:06   Ct Angio Chest Pe W/cm &/or Wo Cm  Result Date: 05/17/2016 CLINICAL DATA:  Dyspnea and chest pain, onset this morning.  EXAM: CT ANGIOGRAPHY CHEST WITH CONTRAST TECHNIQUE: Multidetector CT imaging of the chest was performed using the standard protocol during bolus administration of intravenous contrast. Multiplanar CT image reconstructions and MIPs were obtained to evaluate the vascular anatomy. CONTRAST:  100 mL Isovue 370 intravenous COMPARISON:  Radiograph 05/16/2016 FINDINGS: Cardiovascular: There is good opacification of the pulmonary arteries. There is no pulmonary embolism. The  thoracic aorta is normal in caliber and intact. There is mild atherosclerotic calcification of the thoracic aorta and coronary arteries. Lungs: There is interlobular septal thickening consistent with interstitial fluid. There are minimal atelectatic appearing lung base opacities adjacent to the small pleural effusions. Minimal nodularity is present bilaterally, measuring up to 6 mm, the largest centered on the fissures. These are more likely benign. Central airways: Patent Effusions: Small pleural effusions bilaterally. Lymphadenopathy: Nonspecific nodes in the hila and mediastinum. Esophagus: Unremarkable Upper abdomen: Incompletely imaged hypodense upper pole left renal lesion measuring at least 2.4 cm Musculoskeletal: No significant skeletal lesion. Moderate degenerative thoracic spine changes. Review of the MIP images confirms the above findings. IMPRESSION: 1. Negative for acute pulmonary embolism. 2. Atherosclerotic calcification of the thoracic aorta and coronary arteries. 3. Interstitial fluid, small bilateral pleural effusions. Probable CHF. 4. **An incidental finding of potential clinical significance has been found. Scattered noncalcified nodules measuring up to 6 mm, indeterminate but more likely benign. Non-contrast chest CT at 3-6 months is recommended. If the nodules are stable at time of repeat CT, then future CT at 18-24 months (from today's scan) is considered optional for low-risk patients, but is recommended for high-risk patients.  This recommendation follows the consensus statement: Guidelines for Management of Incidental Pulmonary Nodules Detected on CT Images:From the Fleischner Society 2017; published online before print (10.1148/radiol.SG:5268862).** 5. **An incidental finding of potential clinical significance has been found. There is a hypodense upper pole left renal lesion which is incompletely imaged, extending beyond the inferior edge of this study. It measures at least 2.4 cm. This could merely represent a simple cyst but characterization with ultrasound would be advisable to exclude a significant renal lesion.** Electronically Signed   By: Andreas Newport M.D.   On: 05/17/2016 02:03    My personal review of EKG: Rhythm NSR   Assessment & Plan:    Active Problems:   Hypertension   Type 2 diabetes mellitus with circulatory disorder (HCC)   Peripheral neuropathy (HCC)   Restless leg syndrome   CHF (congestive heart failure) (HCC)   Chest pain   1. Acute on chronic diastolic CHF- patient had echocardiogram in 2014 that showed grade 1 diastolic dysfunction, we will  repeat echocardiogram. Patient has been given 60 mg Lasix in the ED, start Lasix 20 mg IV every 12 hours. Monitor strict intake and output 2. Chest pain- resolved, has mild elevation of troponin, EKG shows no significant ST changes. We'll cycle troponin every 6 hours 3 3. Diabetes mellitus- hold oral hypoglycemic agents and start sliding scale insulin NovoLog. 4. Pulmonary nodules- CT chest shows pulmonary nodules, continued follow-up CT as outpatient in 3-6 months 5. Hypertension- blood pressure is controlled, continue amlodipine 6. History of cerebrovascular disease- continue Plavix, aspirin 7. Hypothyroidism-continue Synthroid 8. Restless leg syndrome- stable, continue Requip 9. History of diabetic foot ulcer- status post debridement of ulcer, will follow up surgery as outpatient at [facility]   DVT Prophylaxis-   Lovenox   AM Labs Ordered,  also please review Full Orders  Family Communication: No family at bedside  Code Status: Full code  Admission status: Observation    Time spent in minutes : 60 minutes   Bronnie Vasseur S M.D on 05/17/2016 at 3:37 AM  Between 7am to 7pm - Pager - [phone]. After 7pm go to [email or web address] - password [facility]  [facility] - Office  [phone]

## 2016-05-17 NOTE — Care Management Obs Status (Signed)
[place] NOTIFICATION   Patient Details  Name: [name] MRN: [id number] Date of Birth: [birth year]   Medicare Observation Status Notification Given:  Yes    Shalah Estelle, Chauncey Reading, RN 05/17/2016, 11:36 AM

## 2016-05-17 NOTE — Progress Notes (Signed)
This is an assumption of care note. He was admitted early this morning with heart failure. He has very little understanding of what is wrong. He says he was not told that he had heart failure in the emergency room but he was told he had fluid around his heart. He had a history of cardiomyopathy in the past. He has multiple cardiac risk factors. He has elevated troponin. I will plan to get echocardiogram continue with IV Lasix request cardiology consultation. He is hopeful of being discharged soon because he wants to see if he can take his walking boot off his foot but he understands it is more important to get his cardiac situation straightened out.

## 2016-05-17 NOTE — Consult Note (Signed)
Reason for Consult:   CHF, chest pain  Requesting Physician: Dr Luan Pulling Primary Cardiologist Dr Harrington Challenger (new), Dr Gwenlyn Found (PV)  HPI:   70 y/o overweight, Caucasian male with a history of DM, HTN, sleep apnea, H/O CVA  In 2015, and PVD s/p CEA, and RLE PTA x 2. He was admitted through the ED at [facility] 05/16/16 with chest pain and dyspnea. He was found to be in acute CHF. We are asked to see in consult.           The pt had an injury to the ball of his Rt foot in June 2015 that eventually led to an angiogram by Dr Gwenlyn Found and subsequent Rt PT PTA with diamondback HSRA. He had restenosis in Feb 2016 with re do PTA and has done well since. He has multiple cardiac risk factors but has never had chest pain or CHF. His last cardiac function study was an echo May of 2014.  He had an EF of 50-55% then with moderate LVH and inferior/ basilar HK.  He has had problems with his Rt foot and underwent Rt foot tendon achilles lengthening and sesamoid incision by Dr Prudy Feeler at [facility] 04/09/16. He presented to the ED at [facility] 04/11/16 with pneumonia.  His Troponin was slightly elevated then and the plan was for further OP cardiology follow up but this was never arranged.            Yesterday the pt says he noted chest "tightness" and SOB. He put off going to the ED but his symptoms worsened. He denies any radiation to his jaw or arms. He denies diaphoresis or nausea. He did have orthopnea. In the ED his BNP was > 900 and he had CHF on CXR. He was admitted and started on IV Lasix. He has diuresed > 700cc and this am feels much improved when I saw him. He denies any prio history of CHF or chest pain. He says he has been active till his recent foot problem but had not previously noted any exertional chest pain.      by Dr Prudy Feeler at   PMHx:  Past Medical History:  Diagnosis Date  . Arthritis   . Carotid artery occlusion    left s/p CEA  . Diabetes mellitus   . Hypertension   . Hypothyroidism   . Obstructive sleep  apnea   . Peripheral arterial disease (HCC)    nonhealing ulcers bilaterally on each great toe  . PONV (postoperative nausea and vomiting)   . Restless leg syndrome   . Shortness of breath   . Stroke [facility]) March 08, 2012    Past Surgical History:  Procedure Laterality Date  . ANGIOPLASTY  02/28/14   diamond back orbital rotational atherectomy of Rt. tibial  . BACK SURGERY    . CERVICAL FUSION    . ENDARTERECTOMY Left 11/29/2013   Procedure: ENDARTERECTOMY CAROTID;  Surgeon: Serafina Mitchell, MD;  Location: [place];  Service: Vascular;  Laterality: Left;  . EYE SURGERY    . FOOT SURGERY    . Lower ext duplex doppler  03/14/14   Rt ABI 1.2  . LOWER EXTREMITY ANGIOGRAM Bilateral 02/18/2014   Procedure: LOWER EXTREMITY ANGIOGRAM;  Surgeon: [name], MD;  Location: [facility] CATH LAB;  Service: Cardiovascular;  Laterality: Bilateral;  . LOWER EXTREMITY ANGIOGRAM N/A 10/31/2014   Procedure: LOWER EXTREMITY ANGIOGRAM;  Surgeon: [name], MD;  Location: [facility] CATH LAB;  Service:  Cardiovascular;  Laterality: N/A;  . PV angiogram  02/18/2014   tibial vessel diseas bil.  [name] SPINE SURGERY    . tendon achillies lengthing and sesamoid      SOCHx:  reports that he quit smoking about 38 years ago. His smoking use included Pipe. He quit after 1.00 year of use. He has never used smokeless tobacco. He reports that he does not drink alcohol or use drugs.  FAMHx: Family History  Problem Relation Age of Onset  . Heart disease Mother   . Hypertension Mother   . Heart attack Mother   . Hypertension Father   . Diabetes Father   . Diabetes Son   . Heart disease Son   . Hypertension Son     ALLERGIES: Allergies  Allergen Reactions  . Codeine Nausea And Vomiting  . Tramadol Nausea Only    ROS: Review of Systems: General: negative for chills, fever, night sweats or weight changes.  Cardiovascular: negative for edema-See HPI HEENT: negative for any visual disturbances, blindness,  glaucoma Dermatological: negative for rash Respiratory: negative for cough, hemoptysis, or wheezing Urologic: negative for hematuria or dysuria Abdominal: negative for nausea, vomiting, diarrhea, bright red blood per rectum, melena, or hematemesis Neurologic: negative for visual changes, syncope, or dizziness Musculoskeletal: negative for back pain, joint pain, or swelling Psych: cooperative and appropriate All other systems reviewed and are otherwise negative except as noted above.   HOME MEDICATIONS: Prior to Admission medications   Medication Sig Start Date End Date Taking? Authorizing Provider  amLODipine (NORVASC) 10 MG tablet Take 10 mg by mouth daily.  05/28/14   Historical Provider, MD  aspirin EC 81 MG tablet Take 1 tablet (81 mg total) by mouth daily. Patient taking differently: Take 325 mg by mouth daily.  11/26/14   Erlene Quan, PA-C  b complex vitamins tablet Take 1 tablet by mouth daily.    Historical Provider, MD  cefUROXime (CEFTIN) 500 MG tablet Take 1 tablet (500 mg total) by mouth 2 (two) times daily with a meal. 04/13/16   [name], MD  clopidogrel (PLAVIX) 75 MG tablet Take 75 mg by mouth daily.  06/16/13   Historical Provider, MD  doxycycline (VIBRA-TABS) 100 MG tablet Take 1 tablet (100 mg total) by mouth every 12 (twelve) hours. 04/13/16   [name], MD  DULoxetine (CYMBALTA) 20 MG capsule Take 1 capsule by mouth 2 (two) times daily. 07/30/14   Historical Provider, MD  Empagliflozin-Linagliptin 10-5 MG TABS Take 1 tablet by mouth daily.    Historical Provider, MD  gabapentin (NEURONTIN) 600 MG tablet Take 600 mg by mouth 4 (four) times daily.     Historical Provider, MD  glimepiride (AMARYL) 4 MG tablet Take 4 mg by mouth daily with breakfast.    Historical Provider, MD  HYDROmorphone (DILAUDID) 2 MG tablet Take 1 tablet by mouth every 4 (four) hours as needed for pain. 04/09/16   Historical Provider, MD  levothyroxine (SYNTHROID, LEVOTHROID) 25 MCG tablet Take  50 mcg by mouth daily.  06/08/13   Historical Provider, MD  Omega 3-6-9 Fatty Acids (OMEGA 3-6-9 COMPLEX) CAPS Take 1 capsule by mouth 2 (two) times daily.    Historical Provider, MD  pravastatin (PRAVACHOL) 40 MG tablet Take 40 mg by mouth every morning.  10/06/12   Historical Provider, MD  rOPINIRole (REQUIP) 0.5 MG tablet Take 1 tablet (0.5 mg total) by mouth at bedtime. 10/29/13   Erlene Quan, PA-C  saccharomyces boulardii (FLORASTOR) 250 MG capsule Take  1 capsule (250 mg total) by mouth 2 (two) times daily. 11/01/14   Janece Canterbury, MD    HOSPITAL MEDICATIONS: I have reviewed the patient's current medications.  VITALS: Blood pressure (!) 144/73, pulse 91, temperature 98.7 F (37.1 C), temperature source Oral, resp. rate 20, height 6' (1.829 m), weight 239 lb 3.2 oz (108.5 kg), SpO2 96 %.  PHYSICAL EXAM: General appearance: alert, cooperative, no distress and moderately obese Neck: no carotid bruit and no JVD Lungs: few crackles Rt base Heart: regular rate and rhythm Abdomen: obese, non tender Extremities: Rt foot in walking cast, no edma on Lt Pulses: 2+ and symmetric Skin: Skin color, texture, turgor normal. No rashes or lesions Neurologic: Grossly normal  LABS: Results for orders placed or performed during the hospital encounter of 05/16/16 (from the past 24 hour(s))  Urinalysis, Routine w reflex microscopic (not at [facility])     Status: Abnormal   Collection Time: 05/16/16  9:24 PM  Result Value Ref Range   Color, Urine YELLOW YELLOW   APPearance CLEAR CLEAR   Specific Gravity, Urine 1.015 1.005 - 1.030   pH 7.5 5.0 - 8.0   Glucose, UA >1000 (A) NEGATIVE mg/dL   Hgb urine dipstick TRACE (A) NEGATIVE   Bilirubin Urine NEGATIVE NEGATIVE   Ketones, ur NEGATIVE NEGATIVE mg/dL   Protein, ur NEGATIVE NEGATIVE mg/dL   Nitrite NEGATIVE NEGATIVE   Leukocytes, UA NEGATIVE NEGATIVE  Urine microscopic-add on     Status: Abnormal   Collection Time: 05/16/16  9:24 PM  Result Value Ref  Range   Squamous Epithelial / LPF 0-5 (A) NONE SEEN   WBC, UA NONE SEEN 0 - 5 WBC/hpf   RBC / HPF 0-5 0 - 5 RBC/hpf   Bacteria, UA RARE (A) NONE SEEN  CBC with Differential     Status: Abnormal   Collection Time: 05/16/16 10:44 PM  Result Value Ref Range   WBC 12.3 (H) 4.0 - 10.5 K/uL   RBC 3.38 (L) 4.22 - 5.81 MIL/uL   Hemoglobin 10.6 (L) 13.0 - 17.0 g/dL   HCT 32.5 (L) 39.0 - 52.0 %   MCV 96.2 78.0 - 100.0 fL   MCH 31.4 26.0 - 34.0 pg   MCHC 32.6 30.0 - 36.0 g/dL   RDW 14.4 11.5 - 15.5 %   Platelets 215 150 - 400 K/uL   Neutrophils Relative % 87 %   Neutro Abs 10.8 (H) 1.7 - 7.7 K/uL   Lymphocytes Relative 6 %   Lymphs Abs 0.7 0.7 - 4.0 K/uL   Monocytes Relative 6 %   Monocytes Absolute 0.7 0.1 - 1.0 K/uL   Eosinophils Relative 1 %   Eosinophils Absolute 0.1 0.0 - 0.7 K/uL   Basophils Relative 0 %   Basophils Absolute 0.0 0.0 - 0.1 K/uL  Basic metabolic panel     Status: Abnormal   Collection Time: 05/16/16 10:44 PM  Result Value Ref Range   Sodium 135 135 - 145 mmol/L   Potassium 3.6 3.5 - 5.1 mmol/L   Chloride 105 101 - 111 mmol/L   CO2 24 22 - 32 mmol/L   Glucose, Bld 133 (H) 65 - 99 mg/dL   BUN 13 6 - 20 mg/dL   Creatinine, Ser 0.70 0.61 - 1.24 mg/dL   Calcium 8.2 (L) 8.9 - 10.3 mg/dL   GFR calc non Af Amer >60 >60 mL/min   GFR calc Af Amer >60 >60 mL/min   Anion gap 6 5 - 15  Troponin I  Status: Abnormal   Collection Time: 05/16/16 10:44 PM  Result Value Ref Range   Troponin I 0.03 (HH) <0.03 ng/mL  Brain natriuretic peptide     Status: Abnormal   Collection Time: 05/16/16 10:44 PM  Result Value Ref Range   B Natriuretic Peptide 944.0 (H) 0.0 - 100.0 pg/mL  Protime-INR     Status: None   Collection Time: 05/16/16 10:44 PM  Result Value Ref Range   Prothrombin Time 13.9 11.4 - 15.2 seconds   INR 1.07   APTT     Status: Abnormal   Collection Time: 05/16/16 10:44 PM  Result Value Ref Range   aPTT 44 (H) 24 - 36 seconds  Troponin I     Status: Abnormal    Collection Time: 05/17/16  1:22 AM  Result Value Ref Range   Troponin I 0.03 (HH) <0.03 ng/mL  Troponin I-serum (0, 3, 6 hours)     Status: Abnormal   Collection Time: 05/17/16  5:41 AM  Result Value Ref Range   Troponin I 0.04 (HH) <0.03 ng/mL  CBC     Status: Abnormal   Collection Time: 05/17/16  5:41 AM  Result Value Ref Range   WBC 10.9 (H) 4.0 - 10.5 K/uL   RBC 3.49 (L) 4.22 - 5.81 MIL/uL   Hemoglobin 11.0 (L) 13.0 - 17.0 g/dL   HCT 34.1 (L) 39.0 - 52.0 %   MCV 97.7 78.0 - 100.0 fL   MCH 31.5 26.0 - 34.0 pg   MCHC 32.3 30.0 - 36.0 g/dL   RDW 14.6 11.5 - 15.5 %   Platelets 235 150 - 400 K/uL  Creatinine, serum     Status: None   Collection Time: 05/17/16  5:41 AM  Result Value Ref Range   Creatinine, Ser 0.88 0.61 - 1.24 mg/dL   GFR calc non Af Amer >60 >60 mL/min   GFR calc Af Amer >60 >60 mL/min  Glucose, capillary     Status: Abnormal   Collection Time: 05/17/16  8:45 AM  Result Value Ref Range   Glucose-Capillary 184 (H) 65 - 99 mg/dL    EKG: NSR, TWI V5-6  IMAGING: Dg Chest 2 View  Result Date: 05/16/2016 CLINICAL DATA:  Right lower leg surgery 6 weeks ago. Shortness of breath starting this morning. Productive cough. EXAM: CHEST  2 VIEW COMPARISON:  04/11/2016 FINDINGS: Cardiac enlargement with pulmonary vascular congestion and interstitial edema. Blunting of the costophrenic angles consistent with small effusions. No pneumothorax. Mediastinal contours appear intact. Degenerative changes in the spine. IMPRESSION: Congestive changes with cardiac enlargement, pulmonary vascular congestion, interstitial edema, and small bilateral pleural effusions. Electronically Signed   By: Lucienne Capers M.D.   On: 05/16/2016 22:06   Ct Angio Chest Pe W/cm &/or Wo Cm  Result Date: 05/17/2016 CLINICAL DATA:  Dyspnea and chest pain, onset this morning. EXAM: CT ANGIOGRAPHY CHEST WITH CONTRAST TECHNIQUE: Multidetector CT imaging of the chest was performed using the standard protocol  during bolus administration of intravenous contrast. Multiplanar CT image reconstructions and MIPs were obtained to evaluate the vascular anatomy. CONTRAST:  100 mL Isovue 370 intravenous COMPARISON:  Radiograph 05/16/2016 FINDINGS: Cardiovascular: There is good opacification of the pulmonary arteries. There is no pulmonary embolism. The thoracic aorta is normal in caliber and intact. There is mild atherosclerotic calcification of the thoracic aorta and coronary arteries. Lungs: There is interlobular septal thickening consistent with interstitial fluid. There are minimal atelectatic appearing lung base opacities adjacent to the small pleural effusions. Minimal nodularity  is present bilaterally, measuring up to 6 mm, the largest centered on the fissures. These are more likely benign. Central airways: Patent Effusions: Small pleural effusions bilaterally. Lymphadenopathy: Nonspecific nodes in the hila and mediastinum. Esophagus: Unremarkable Upper abdomen: Incompletely imaged hypodense upper pole left renal lesion measuring at least 2.4 cm Musculoskeletal: No significant skeletal lesion. Moderate degenerative thoracic spine changes. Review of the MIP images confirms the above findings. IMPRESSION: 1. Negative for acute pulmonary embolism. 2. Atherosclerotic calcification of the thoracic aorta and coronary arteries. 3. Interstitial fluid, small bilateral pleural effusions. Probable CHF. 4. **An incidental finding of potential clinical significance has been found. Scattered noncalcified nodules measuring up to 6 mm, indeterminate but more likely benign. Non-contrast chest CT at 3-6 months is recommended. If the nodules are stable at time of repeat CT, then future CT at 18-24 months (from today's scan) is considered optional for low-risk patients, but is recommended for high-risk patients. This recommendation follows the consensus statement: Guidelines for Management of Incidental Pulmonary Nodules Detected on CT  Images:From the Fleischner Society 2017; published online before print (10.1148/radiol.SG:[id number]).** 5. **An incidental finding of potential clinical significance has been found. There is a hypodense upper pole left renal lesion which is incompletely imaged, extending beyond the inferior edge of this study. It measures at least 2.4 cm. This could merely represent a simple cyst but characterization with ultrasound would be advisable to exclude a significant renal lesion.** Electronically Signed   By: Andreas Newport M.D.   On: 05/17/2016 02:03    IMPRESSION: Principal Problem:   Acute CHF (congestive heart failure) (HCC) Active Problems:   Chest pain with moderate risk of acute coronary syndrome   Hypertension   Type 2 diabetes mellitus with circulatory disorder (HCC)   Peripheral arterial disease (HCC)   Elevated troponin   History of stroke June 2013   Peripheral neuropathy (HCC)   Hyperlipidemia   Restless leg syndrome   Obstructive sleep apnea   RECOMMENDATION: Troponin elevation is  relatively flat, not suggestive of acute MI, probably demand ischemia. He has multiple risk factor for significant CAD and may need diagnostic angiogram-? timing. Echo is pending. Dr Harrington Challenger to see.   Pt did have 10 beats of NSWCT on telemetry- will add low dose beta blocker. Pt is on Plavix, decrease ASA top 81 mg. Continue statin Rx and Lasix as ordered.   Time Spent Directly with Patient: 50 minutes  Kerin Ransom, Portia beeper 05/17/2016, 10:21 AM   Pt seen and examined  I agree with findings as noted by L Kilroy above   Pt is a 70 yo with DM (with neuropathy), HTN, CV dz  Admitted with CP and SOB  Currently no CP  Breathing is better  Has diuresed some   On exam:  JVP is mildly increased  LUngs are CTA  Cardiac RRR  No S3  Ext with Tr edema  1  CHF  I would conitnue diuresis with IV lasix  Echo is pending   COncerning that above may represent angina in setting of pt with known  vascular dz.  2  Troponin  Trivial elevation  3  HX CV dz  Continue plavix  4.  HL  Keep on statin  5  Anemia  Hgb 11  IM to follow  .  Dorris Carnes

## 2016-05-18 ENCOUNTER — Observation Stay (HOSPITAL_BASED_OUTPATIENT_CLINIC_OR_DEPARTMENT_OTHER): Payer: PPO

## 2016-05-18 DIAGNOSIS — I5033 Acute on chronic diastolic (congestive) heart failure: Secondary | ICD-10-CM | POA: Diagnosis not present

## 2016-05-18 DIAGNOSIS — G4733 Obstructive sleep apnea (adult) (pediatric): Secondary | ICD-10-CM | POA: Diagnosis not present

## 2016-05-18 DIAGNOSIS — R7989 Other specified abnormal findings of blood chemistry: Secondary | ICD-10-CM

## 2016-05-18 DIAGNOSIS — E1142 Type 2 diabetes mellitus with diabetic polyneuropathy: Secondary | ICD-10-CM | POA: Diagnosis not present

## 2016-05-18 DIAGNOSIS — E039 Hypothyroidism, unspecified: Secondary | ICD-10-CM | POA: Diagnosis not present

## 2016-05-18 DIAGNOSIS — I5021 Acute systolic (congestive) heart failure: Secondary | ICD-10-CM | POA: Diagnosis not present

## 2016-05-18 DIAGNOSIS — I509 Heart failure, unspecified: Secondary | ICD-10-CM | POA: Diagnosis not present

## 2016-05-18 DIAGNOSIS — I110 Hypertensive heart disease with heart failure: Secondary | ICD-10-CM | POA: Diagnosis not present

## 2016-05-18 DIAGNOSIS — Z8673 Personal history of transient ischemic attack (TIA), and cerebral infarction without residual deficits: Secondary | ICD-10-CM | POA: Diagnosis not present

## 2016-05-18 DIAGNOSIS — Z833 Family history of diabetes mellitus: Secondary | ICD-10-CM | POA: Diagnosis not present

## 2016-05-18 DIAGNOSIS — I2510 Atherosclerotic heart disease of native coronary artery without angina pectoris: Secondary | ICD-10-CM | POA: Diagnosis not present

## 2016-05-18 DIAGNOSIS — R079 Chest pain, unspecified: Secondary | ICD-10-CM | POA: Diagnosis not present

## 2016-05-18 DIAGNOSIS — G2581 Restless legs syndrome: Secondary | ICD-10-CM | POA: Diagnosis not present

## 2016-05-18 DIAGNOSIS — E1151 Type 2 diabetes mellitus with diabetic peripheral angiopathy without gangrene: Secondary | ICD-10-CM | POA: Diagnosis not present

## 2016-05-18 DIAGNOSIS — I471 Supraventricular tachycardia: Secondary | ICD-10-CM | POA: Diagnosis not present

## 2016-05-18 LAB — GLUCOSE, CAPILLARY
Glucose-Capillary: 149 mg/dL — ABNORMAL HIGH (ref 65–99)
Glucose-Capillary: 142 mg/dL — ABNORMAL HIGH (ref 65–99)
Glucose-Capillary: 112 mg/dL — ABNORMAL HIGH (ref 65–99)
Glucose-Capillary: 245 mg/dL — ABNORMAL HIGH (ref 65–99)

## 2016-05-18 LAB — HEMOGLOBIN A1C
Hgb A1c MFr Bld: 6.2 % — ABNORMAL HIGH (ref 4.8–5.6)
Mean Plasma Glucose: 131 mg/dL

## 2016-05-18 LAB — ECHOCARDIOGRAM COMPLETE
Height: 72 in
Weight: 3827.2 oz

## 2016-05-18 MED ORDER — INSULIN ASPART 100 UNIT/ML ~~LOC~~ SOLN
0.0000 [IU] | Freq: Three times a day (TID) | SUBCUTANEOUS | Status: DC
  Administered 2016-05-19 – 2016-05-20 (×2): 1 [IU] via SUBCUTANEOUS
  Administered 2016-05-20: 2 [IU] via SUBCUTANEOUS
  Administered 2016-05-20: 1 [IU] via SUBCUTANEOUS
  Administered 2016-05-21: 2 [IU] via SUBCUTANEOUS
  Administered 2016-05-21: 1 [IU] via SUBCUTANEOUS

## 2016-05-18 MED ORDER — SODIUM CHLORIDE 0.9 % IV SOLN: 250.0000 mL | INTRAVENOUS | Status: DC | PRN

## 2016-05-18 MED ORDER — SODIUM CHLORIDE 0.9% FLUSH
3.0000 mL | Freq: Two times a day (BID) | INTRAVENOUS | Status: DC
  Administered 2016-05-18 – 2016-05-19 (×2): 3 mL via INTRAVENOUS

## 2016-05-18 MED ORDER — SODIUM CHLORIDE 0.9 % IV SOLN
INTRAVENOUS | Status: DC
  Administered 2016-05-19: 08:00:00 via INTRAVENOUS

## 2016-05-18 MED ORDER — FUROSEMIDE 10 MG/ML IJ SOLN
60.0000 mg | Freq: Two times a day (BID) | INTRAMUSCULAR | Status: DC
  Administered 2016-05-19 – 2016-05-21 (×5): 60 mg via INTRAVENOUS
  Filled 2016-05-18 (×5): qty 6

## 2016-05-18 MED ORDER — SODIUM CHLORIDE 0.9% FLUSH: 3.0000 mL | INTRAVENOUS | Status: DC | PRN

## 2016-05-18 MED ORDER — LEVOTHYROXINE SODIUM 50 MCG PO TABS
50.0000 ug | ORAL_TABLET | Freq: Every day | ORAL | Status: DC
  Administered 2016-05-19 – 2016-05-21 (×3): 50 ug via ORAL
  Filled 2016-05-18 (×4): qty 1

## 2016-05-18 NOTE — Progress Notes (Signed)
Pt is being transferred via [facility] to [facility] for a Cardiac cath. Pt will be staying of  [facility]. Called to give report to [facility]. Gave report to [facility]. VSS. Pt stable. RN will continue to monitor Oswald Hillock

## 2016-05-18 NOTE — Progress Notes (Signed)
Subjective: He says he feels better. He is less short of breath. Cardiology consultation noted and appreciated. Echocardiogram is pending.  Objective: Vital signs in last 24 hours: Temp:  [97.9 F (36.6 C)-98.3 F (36.8 C)] 97.9 F (36.6 C) (08/29 0438) Pulse Rate:  [76-90] 76 (08/29 0438) Resp:  [20] 20 (08/28 1416) BP: (116-133)/(61-78) 133/78 (08/29 0438) SpO2:  [90 %-95 %] 95 % (08/29 0438) Weight change:  Last BM Date: 05/16/16  Intake/Output from previous day: 08/28 0701 - 08/29 0700 In: 720 [P.O.:720] Out: 1400 [Urine:1400]  PHYSICAL EXAM General appearance: alert, cooperative and no distress Resp: clear to auscultation bilaterally Cardio: regular rate and rhythm, S1, S2 normal, no murmur, click, rub or gallop GI: soft, non-tender; bowel sounds normal; no masses,  no organomegaly ExtremitieHe still has a boot on his right legHe still has a boot on his right leg  Lab Results:  Results for orders placed or performed during the hospital encounter of 05/16/16 (from the past 48 hour(s))  Urinalysis, Routine w reflex microscopic (not at [facility])     Status: Abnormal   Collection Time: 05/16/16  9:24 PM  Result Value Ref Range   Color, Urine YELLOW YELLOW   APPearance CLEAR CLEAR   Specific Gravity, Urine 1.015 1.005 - 1.030   pH 7.5 5.0 - 8.0   Glucose, UA >1000 (A) NEGATIVE mg/dL   Hgb urine dipstick TRACE (A) NEGATIVE   Bilirubin Urine NEGATIVE NEGATIVE   Ketones, ur NEGATIVE NEGATIVE mg/dL   Protein, ur NEGATIVE NEGATIVE mg/dL   Nitrite NEGATIVE NEGATIVE   Leukocytes, UA NEGATIVE NEGATIVE  Urine microscopic-add on     Status: Abnormal   Collection Time: 05/16/16  9:24 PM  Result Value Ref Range   Squamous Epithelial / LPF 0-5 (A) NONE SEEN   WBC, UA NONE SEEN 0 - 5 WBC/hpf   RBC / HPF 0-5 0 - 5 RBC/hpf   Bacteria, UA RARE (A) NONE SEEN  CBC with Differential     Status: Abnormal   Collection Time: 05/16/16 10:44 PM  Result Value Ref Range   WBC 12.3 (H) 4.0 -  10.5 K/uL   RBC 3.38 (L) 4.22 - 5.81 MIL/uL   Hemoglobin 10.6 (L) 13.0 - 17.0 g/dL   HCT 32.5 (L) 39.0 - 52.0 %   MCV 96.2 78.0 - 100.0 fL   MCH 31.4 26.0 - 34.0 pg   MCHC 32.6 30.0 - 36.0 g/dL   RDW 14.4 11.5 - 15.5 %   Platelets 215 150 - 400 K/uL   Neutrophils Relative % 87 %   Neutro Abs 10.8 (H) 1.7 - 7.7 K/uL   Lymphocytes Relative 6 %   Lymphs Abs 0.7 0.7 - 4.0 K/uL   Monocytes Relative 6 %   Monocytes Absolute 0.7 0.1 - 1.0 K/uL   Eosinophils Relative 1 %   Eosinophils Absolute 0.1 0.0 - 0.7 K/uL   Basophils Relative 0 %   Basophils Absolute 0.0 0.0 - 0.1 K/uL  Basic metabolic panel     Status: Abnormal   Collection Time: 05/16/16 10:44 PM  Result Value Ref Range   Sodium 135 135 - 145 mmol/L   Potassium 3.6 3.5 - 5.1 mmol/L   Chloride 105 101 - 111 mmol/L   CO2 24 22 - 32 mmol/L   Glucose, Bld 133 (H) 65 - 99 mg/dL   BUN 13 6 - 20 mg/dL   Creatinine, Ser 0.70 0.61 - 1.24 mg/dL   Calcium 8.2 (L) 8.9 - 10.3 mg/dL  GFR calc non Af Amer >60 >60 mL/min   GFR calc Af Amer >60 >60 mL/min    Comment: (NOTE) The eGFR has been calculated using the CKD EPI equation. This calculation has not been validated in all clinical situations. eGFR's persistently <60 mL/min signify possible Chronic Kidney Disease.    Anion gap 6 5 - 15  Troponin I     Status: Abnormal   Collection Time: 05/16/16 10:44 PM  Result Value Ref Range   Troponin I 0.03 (HH) <0.03 ng/mL    Comment: CRITICAL RESULT CALLED TO, READ BACK BY AND VERIFIED WITH:  TUTTLE,A @ 7867 ON 05/16/16 BY JUW   Brain natriuretic peptide     Status: Abnormal   Collection Time: 05/16/16 10:44 PM  Result Value Ref Range   B Natriuretic Peptide 944.0 (H) 0.0 - 100.0 pg/mL  Protime-INR     Status: None   Collection Time: 05/16/16 10:44 PM  Result Value Ref Range   Prothrombin Time 13.9 11.4 - 15.2 seconds   INR 1.07   APTT     Status: Abnormal   Collection Time: 05/16/16 10:44 PM  Result Value Ref Range   aPTT 44 (H)  24 - 36 seconds    Comment:        IF BASELINE aPTT IS ELEVATED, SUGGEST PATIENT RISK ASSESSMENT BE USED TO DETERMINE APPROPRIATE ANTICOAGULANT THERAPY.   Troponin I     Status: Abnormal   Collection Time: 05/17/16  1:22 AM  Result Value Ref Range   Troponin I 0.03 (HH) <0.03 ng/mL    Comment: CRITICAL VALUE NOTED.  VALUE IS CONSISTENT WITH PREVIOUSLY REPORTED AND CALLED VALUE.  Hemoglobin A1c     Status: Abnormal   Collection Time: 05/17/16  5:41 AM  Result Value Ref Range   Hgb A1c MFr Bld 6.2 (H) 4.8 - 5.6 %    Comment: (NOTE)         Pre-diabetes: 5.7 - 6.4         Diabetes: >6.4         Glycemic control for adults with diabetes: <7.0    Mean Plasma Glucose 131 mg/dL    Comment: (NOTE) Performed At: [facility] [address] [place], [place] [id number] Lindon Romp MD GG:[id number]   Troponin I-serum (0, 3, 6 hours)     Status: Abnormal   Collection Time: 05/17/16  5:41 AM  Result Value Ref Range   Troponin I 0.04 (HH) <0.03 ng/mL    Comment: CRITICAL RESULT CALLED TO, READ BACK BY AND VERIFIED WITH: DAVIS,L AT 7:25AM ON 05/17/16 BY FESTERMAN,C   CBC     Status: Abnormal   Collection Time: 05/17/16  5:41 AM  Result Value Ref Range   WBC 10.9 (H) 4.0 - 10.5 K/uL   RBC 3.49 (L) 4.22 - 5.81 MIL/uL   Hemoglobin 11.0 (L) 13.0 - 17.0 g/dL   HCT 34.1 (L) 39.0 - 52.0 %   MCV 97.7 78.0 - 100.0 fL   MCH 31.5 26.0 - 34.0 pg   MCHC 32.3 30.0 - 36.0 g/dL   RDW 14.6 11.5 - 15.5 %   Platelets 235 150 - 400 K/uL  Creatinine, serum     Status: None   Collection Time: 05/17/16  5:41 AM  Result Value Ref Range   Creatinine, Ser 0.88 0.61 - 1.24 mg/dL   GFR calc non Af Amer >60 >60 mL/min   GFR calc Af Amer >60 >60 mL/min    Comment: (NOTE) The eGFR  has been calculated using the CKD EPI equation. This calculation has not been validated in all clinical situations. eGFR's persistently <60 mL/min signify possible Chronic Kidney Disease.   Glucose, capillary      Status: Abnormal   Collection Time: 05/17/16  8:45 AM  Result Value Ref Range   Glucose-Capillary 184 (H) 65 - 99 mg/dL  Troponin I-serum (0, 3, 6 hours)     Status: Abnormal   Collection Time: 05/17/16 11:00 AM  Result Value Ref Range   Troponin I 0.04 (HH) <0.03 ng/mL    Comment: CRITICAL VALUE NOTED.  VALUE IS CONSISTENT WITH PREVIOUSLY REPORTED AND CALLED VALUE.  Glucose, capillary     Status: Abnormal   Collection Time: 05/17/16 11:37 AM  Result Value Ref Range   Glucose-Capillary 118 (H) 65 - 99 mg/dL   Comment 1 Notify RN    Comment 2 Document in Chart   Glucose, capillary     Status: Abnormal   Collection Time: 05/17/16  4:58 PM  Result Value Ref Range   Glucose-Capillary 127 (H) 65 - 99 mg/dL   Comment 1 Notify RN    Comment 2 Document in Chart   Troponin I-serum (0, 3, 6 hours)     Status: Abnormal   Collection Time: 05/17/16  5:16 PM  Result Value Ref Range   Troponin I 0.03 (HH) <0.03 ng/mL    Comment: CRITICAL VALUE NOTED.  VALUE IS CONSISTENT WITH PREVIOUSLY REPORTED AND CALLED VALUE.  Glucose, capillary     Status: Abnormal   Collection Time: 05/17/16  8:41 PM  Result Value Ref Range   Glucose-Capillary 159 (H) 65 - 99 mg/dL   Comment 1 Notify RN    Comment 2 Document in Chart     ABGS No results for input(s): PHART, PO2ART, TCO2, HCO3 in the last 72 hours.  Invalid input(s): PCO2 CULTURES No results found for this or any previous visit (from the past 240 hour(s)). Studies/Results: Dg Chest 2 View  Result Date: 05/16/2016 CLINICAL DATA:  Right lower leg surgery 6 weeks ago. Shortness of breath starting this morning. Productive cough. EXAM: CHEST  2 VIEW COMPARISON:  04/11/2016 FINDINGS: Cardiac enlargement with pulmonary vascular congestion and interstitial edema. Blunting of the costophrenic angles consistent with small effusions. No pneumothorax. Mediastinal contours appear intact. Degenerative changes in the spine. IMPRESSION: Congestive changes with  cardiac enlargement, pulmonary vascular congestion, interstitial edema, and small bilateral pleural effusions. Electronically Signed   By: Lucienne Capers M.D.   On: 05/16/2016 22:06   Ct Angio Chest Pe W/cm &/or Wo Cm  Result Date: 05/17/2016 CLINICAL DATA:  Dyspnea and chest pain, onset this morning. EXAM: CT ANGIOGRAPHY CHEST WITH CONTRAST TECHNIQUE: Multidetector CT imaging of the chest was performed using the standard protocol during bolus administration of intravenous contrast. Multiplanar CT image reconstructions and MIPs were obtained to evaluate the vascular anatomy. CONTRAST:  100 mL Isovue 370 intravenous COMPARISON:  Radiograph 05/16/2016 FINDINGS: Cardiovascular: There is good opacification of the pulmonary arteries. There is no pulmonary embolism. The thoracic aorta is normal in caliber and intact. There is mild atherosclerotic calcification of the thoracic aorta and coronary arteries. Lungs: There is interlobular septal thickening consistent with interstitial fluid. There are minimal atelectatic appearing lung base opacities adjacent to the small pleural effusions. Minimal nodularity is present bilaterally, measuring up to 6 mm, the largest centered on the fissures. These are more likely benign. Central airways: Patent Effusions: Small pleural effusions bilaterally. Lymphadenopathy: Nonspecific nodes in the hila and mediastinum.  Esophagus: Unremarkable Upper abdomen: Incompletely imaged hypodense upper pole left renal lesion measuring at least 2.4 cm Musculoskeletal: No significant skeletal lesion. Moderate degenerative thoracic spine changes. Review of the MIP images confirms the above findings. IMPRESSION: 1. Negative for acute pulmonary embolism. 2. Atherosclerotic calcification of the thoracic aorta and coronary arteries. 3. Interstitial fluid, small bilateral pleural effusions. Probable CHF. 4. **An incidental finding of potential clinical significance has been found. Scattered noncalcified  nodules measuring up to 6 mm, indeterminate but more likely benign. Non-contrast chest CT at 3-6 months is recommended. If the nodules are stable at time of repeat CT, then future CT at 18-24 months (from today's scan) is considered optional for low-risk patients, but is recommended for high-risk patients. This recommendation follows the consensus statement: Guidelines for Management of Incidental Pulmonary Nodules Detected on CT Images:From the Fleischner Society 2017; published online before print (10.1148/[id number]).** 5. **An incidental finding of potential clinical significance has been found. There is a hypodense upper pole left renal lesion which is incompletely imaged, extending beyond the inferior edge of this study. It measures at least 2.4 cm. This could merely represent a simple cyst but characterization with ultrasound would be advisable to exclude a significant renal lesion.** Electronically Signed   By: Andreas Newport M.D.   On: 05/17/2016 02:03    Medications:  Prior to Admission:  Prescriptions Prior to Admission  Medication Sig Dispense Refill Last Dose  . aspirin 325 MG tablet Take 325 mg by mouth daily.   05/16/2016 at Unknown time  . b complex vitamins tablet Take 1 tablet by mouth daily.   05/16/2016 at Unknown time  . clopidogrel (PLAVIX) 75 MG tablet Take 75 mg by mouth daily.    05/16/2016 at Unknown time  . DULoxetine (CYMBALTA) 60 MG capsule Take 1 capsule by mouth 2 (two) times daily.   05/16/2016 at Unknown time  . gabapentin (NEURONTIN) 600 MG tablet Take 600 mg by mouth 4 (four) times daily.    05/16/2016 at Unknown time  . glimepiride (AMARYL) 4 MG tablet Take 4 mg by mouth daily with breakfast.   05/16/2016 at Unknown time  . levothyroxine (SYNTHROID, LEVOTHROID) 25 MCG tablet Take 50 mcg by mouth daily.    05/16/2016 at Unknown time  . Omega 3-6-9 Fatty Acids (OMEGA 3-6-9 COMPLEX) CAPS Take 1 capsule by mouth 2 (two) times daily.   05/16/2016 at Unknown time  .  pravastatin (PRAVACHOL) 40 MG tablet Take 40 mg by mouth every morning.    unknown  . rOPINIRole (REQUIP) 0.5 MG tablet Take 1 tablet (0.5 mg total) by mouth at bedtime. (Patient taking differently: Take 0.5 mg by mouth 2 (two) times daily. ) 30 tablet 11 05/16/2016 at Unknown time   Scheduled: . amLODipine  10 mg Oral Daily  . aspirin EC  81 mg Oral Daily  . clopidogrel  75 mg Oral Daily  . DULoxetine  20 mg Oral BID  . enoxaparin (LOVENOX) injection  40 mg Subcutaneous Q24H  . furosemide  60 mg Intravenous Q12H  . gabapentin  600 mg Oral QID  . insulin aspart  0-9 Units Subcutaneous TID WC  . levothyroxine  50 mcg Oral QAC breakfast  . metoprolol tartrate  12.5 mg Oral BID  . pravastatin  40 mg Oral BH-q7a  . rOPINIRole  0.5 mg Oral QHS  . saccharomyces boulardii  250 mg Oral BID   Continuous:  GUR:[id number], nitroGLYCERIN, ondansetron (ZOFRAN) IV  Assesment: He was admitted with congestive heart failure.  He has multiple cardiac risk factors. He had some chest discomfort as well. He had elevated troponin but doesn't appear that he is having acute coronary syndrome. He has a history of peripheral arterial disease and has had surgery on his foot  He has diabetes with diabetic peripheral neuropathy.  He has elevated lipids  He has hypertension  He has personal history of stroke  He has obstructive sleep apnea on CPAP Principal Problem:   Congestive heart failure ([place]) Active Problems:   History of stroke June 2013   Hypertension   Type 2 diabetes mellitus with circulatory disorder (HCC)   Peripheral neuropathy (HCC)   Hyperlipidemia   Peripheral arterial disease (HCC)   Restless leg syndrome   Obstructive sleep apnea   Elevated troponin   Pain in the chest   Pulmonary nodule    Plan: Discussed with Dr. Johnsie Cancel plan is for him to have echocardiogram and potential transfer to [facility] for cardiac catheterization    LOS: 0 days   HAWKINS,EDWARD L 05/18/2016,  8:56 AM

## 2016-05-18 NOTE — Progress Notes (Signed)
Patient ID: [name], male   DOB: 11/01/1945, 70 y.o.   MRN: [id number]    Subjective:  Denies SSCP, palpitations or Dyspnea Breathing better   Objective:  Vitals:   05/17/16 1416 05/17/16 1954 05/17/16 2042 05/18/16 0438  BP: 116/61  126/67 133/78  Pulse: 79  90 76  Resp: 20     Temp: 98.1 F (36.7 C)  98.3 F (36.8 C) 97.9 F (36.6 C)  TempSrc: Oral  Oral Oral  SpO2: 95% 90% 95% 95%  Weight:      Height:        Intake/Output from previous day:  Intake/Output Summary (Last 24 hours) at 05/18/16 0835 Last data filed at 05/18/16 0816  Gross per 24 hour  Intake              720 ml  Output             1845 ml  Net            -1125 ml    Physical Exam: Affect appropriate Healthy:  appears stated age HEENT: normal Neck supple with no adenopathy JVP normal no bruits no thyromegaly Lungs clear with no wheezing and good diaphragmatic motion Heart:  S1/S2 no murmur, no rub, gallop or click PMI normal Abdomen: benighn, BS positve, no tenderness, no AAA no bruit.  No HSM or HJR Femoral bruit RLE in boot  No edema Neuro non-focal Skin warm and dry No muscular weakness   Lab Results: Basic Metabolic Panel:  Recent Labs  05/16/16 2244 05/17/16 0541  NA 135  --   K 3.6  --   CL 105  --   CO2 24  --   GLUCOSE 133*  --   BUN 13  --   CREATININE 0.70 0.88  CALCIUM 8.2*  --    CBC:  Recent Labs  05/16/16 2244 05/17/16 0541  WBC 12.3* 10.9*  NEUTROABS 10.8*  --   HGB 10.6* 11.0*  HCT 32.5* 34.1*  MCV 96.2 97.7  PLT 215 235   Cardiac Enzymes:  Recent Labs  05/17/16 0541 05/17/16 1100 05/17/16 1716  TROPONINI 0.04* 0.04* 0.03*   BNP: Invalid input(s): POCBNP D-Dimer: No results for input(s): DDIMER in the last 72 hours. Hemoglobin A1C:  Recent Labs  05/17/16 0541  HGBA1C 6.2*    Imaging: Dg Chest 2 View  Result Date: 05/16/2016 CLINICAL DATA:  Right lower leg surgery 6 weeks ago. Shortness of breath starting this morning.  Productive cough. EXAM: CHEST  2 VIEW COMPARISON:  04/11/2016 FINDINGS: Cardiac enlargement with pulmonary vascular congestion and interstitial edema. Blunting of the costophrenic angles consistent with small effusions. No pneumothorax. Mediastinal contours appear intact. Degenerative changes in the spine. IMPRESSION: Congestive changes with cardiac enlargement, pulmonary vascular congestion, interstitial edema, and small bilateral pleural effusions. Electronically Signed   By: Lucienne Capers M.D.   On: 05/16/2016 22:06   Ct Angio Chest Pe W/cm &/or Wo Cm  Result Date: 05/17/2016 CLINICAL DATA:  Dyspnea and chest pain, onset this morning. EXAM: CT ANGIOGRAPHY CHEST WITH CONTRAST TECHNIQUE: Multidetector CT imaging of the chest was performed using the standard protocol during bolus administration of intravenous contrast. Multiplanar CT image reconstructions and MIPs were obtained to evaluate the vascular anatomy. CONTRAST:  100 mL Isovue 370 intravenous COMPARISON:  Radiograph 05/16/2016 FINDINGS: Cardiovascular: There is good opacification of the pulmonary arteries. There is no pulmonary embolism. The thoracic aorta is normal in caliber and intact. There is mild atherosclerotic calcification of  the thoracic aorta and coronary arteries. Lungs: There is interlobular septal thickening consistent with interstitial fluid. There are minimal atelectatic appearing lung base opacities adjacent to the small pleural effusions. Minimal nodularity is present bilaterally, measuring up to 6 mm, the largest centered on the fissures. These are more likely benign. Central airways: Patent Effusions: Small pleural effusions bilaterally. Lymphadenopathy: Nonspecific nodes in the hila and mediastinum. Esophagus: Unremarkable Upper abdomen: Incompletely imaged hypodense upper pole left renal lesion measuring at least 2.4 cm Musculoskeletal: No significant skeletal lesion. Moderate degenerative thoracic spine changes. Review of the  MIP images confirms the above findings. IMPRESSION: 1. Negative for acute pulmonary embolism. 2. Atherosclerotic calcification of the thoracic aorta and coronary arteries. 3. Interstitial fluid, small bilateral pleural effusions. Probable CHF. 4. **An incidental finding of potential clinical significance has been found. Scattered noncalcified nodules measuring up to 6 mm, indeterminate but more likely benign. Non-contrast chest CT at 3-6 months is recommended. If the nodules are stable at time of repeat CT, then future CT at 18-24 months (from today's scan) is considered optional for low-risk patients, but is recommended for high-risk patients. This recommendation follows the consensus statement: Guidelines for Management of Incidental Pulmonary Nodules Detected on CT Images:From the Fleischner Society 2017; published online before print (10.1148/radiol.[id number]).** 5. **An incidental finding of potential clinical significance has been found. There is a hypodense upper pole left renal lesion which is incompletely imaged, extending beyond the inferior edge of this study. It measures at least 2.4 cm. This could merely represent a simple cyst but characterization with ultrasound would be advisable to exclude a significant renal lesion.** Electronically Signed   By: Andreas Newport M.D.   On: 05/17/2016 02:03    Cardiac Studies:  ECG:  ST rate 104 nonspecific ST changes    Telemetry:  NSR 05/18/2016   Echo:   Medications:   . amLODipine  10 mg Oral Daily  . aspirin EC  81 mg Oral Daily  . clopidogrel  75 mg Oral Daily  . DULoxetine  20 mg Oral BID  . enoxaparin (LOVENOX) injection  40 mg Subcutaneous Q24H  . furosemide  60 mg Intravenous Q12H  . gabapentin  600 mg Oral QID  . insulin aspart  0-9 Units Subcutaneous TID WC  . levothyroxine  50 mcg Oral QAC breakfast  . metoprolol tartrate  12.5 mg Oral BID  . pravastatin  40 mg Oral BH-q7a  . rOPINIRole  0.5 mg Oral QHS  . saccharomyces  boulardii  250 mg Oral BID       Assessment/Plan:   Chest Pain:  Worrisome given DM and known PVD echo pending favor transfer [facility] Tomorrow will check echo first discussed with patient ECG no acute changes troponin ok  CHF:  Improved with diuresis echo pending likely right and left cath in am at [facility]  PVD:  Mild claudication should have outpatient f/u Dr Gwenlyn Found  Chol:  On statin  HTN: Well controlled.  Continue current medications and low sodium Dash type diet.    Ortho:  In boot post achilles surgery a month ago   Jenkins Rouge 05/18/2016, 8:35 AM

## 2016-05-18 NOTE — Progress Notes (Signed)
1600 Placed a call to [facility] about pt" s presence to 3e .Requested to call Wannetta Sender ,cardmaster to assign MD adimission

## 2016-05-18 NOTE — Progress Notes (Signed)
1353 Report received from [place] [facility] . Kept comfortable in bed. Family in attedance

## 2016-05-18 NOTE — Consult Note (Signed)
   [facility] Inpatient Consult   05/18/2016  [name] 08/21/1946 [id number]  Spoke with patient and wife at bedside regarding [facility] services. Patient does not want to participate with [facility] at this time. Patient given [facility] brochure and contact information for future reference.  Of note, [facility] Care Management services would not replace or interfere with any services that are arranged by inpatient case management or social work. For additional questions or referrals please contact:  Royetta Crochet. Laymond Purser, RN, BSN, [facility] Liaison [phone]

## 2016-05-18 NOTE — Progress Notes (Signed)
Upon entering pts room pt was found on his knees. Pt states he does this at home. Pt is asked if he is in any pain or distress. Pt verbalizes that he is fine and refuses any other evaluations except for a thorough assessment. No signs of distress or any harm at this time. Pt is  instructed to call if he feels he need to go to his knee again. Will continue to monitor.

## 2016-05-18 NOTE — Progress Notes (Signed)
*  PRELIMINARY RESULTS* Echocardiogram 2D Echocardiogram has been performed.  Leavy Cella 05/18/2016, 11:07 AM

## 2016-05-19 ENCOUNTER — Encounter (HOSPITAL_COMMUNITY)
Admission: EM | Disposition: A | Discharge status: Home / Self Care | Payer: Self-pay | Source: Home / Self Care | Attending: Cardiology

## 2016-05-19 DIAGNOSIS — R7989 Other specified abnormal findings of blood chemistry: Secondary | ICD-10-CM | POA: Diagnosis not present

## 2016-05-19 DIAGNOSIS — I208 Other forms of angina pectoris: Secondary | ICD-10-CM

## 2016-05-19 DIAGNOSIS — I110 Hypertensive heart disease with heart failure: Secondary | ICD-10-CM | POA: Diagnosis not present

## 2016-05-19 DIAGNOSIS — E785 Hyperlipidemia, unspecified: Secondary | ICD-10-CM | POA: Diagnosis not present

## 2016-05-19 DIAGNOSIS — I429 Cardiomyopathy, unspecified: Secondary | ICD-10-CM | POA: Diagnosis not present

## 2016-05-19 DIAGNOSIS — E1151 Type 2 diabetes mellitus with diabetic peripheral angiopathy without gangrene: Secondary | ICD-10-CM | POA: Diagnosis not present

## 2016-05-19 DIAGNOSIS — E039 Hypothyroidism, unspecified: Secondary | ICD-10-CM | POA: Diagnosis not present

## 2016-05-19 DIAGNOSIS — R911 Solitary pulmonary nodule: Secondary | ICD-10-CM | POA: Diagnosis not present

## 2016-05-19 DIAGNOSIS — Z8673 Personal history of transient ischemic attack (TIA), and cerebral infarction without residual deficits: Secondary | ICD-10-CM | POA: Diagnosis not present

## 2016-05-19 DIAGNOSIS — Z87891 Personal history of nicotine dependence: Secondary | ICD-10-CM | POA: Diagnosis not present

## 2016-05-19 DIAGNOSIS — Z885 Allergy status to narcotic agent status: Secondary | ICD-10-CM | POA: Diagnosis not present

## 2016-05-19 DIAGNOSIS — I471 Supraventricular tachycardia: Secondary | ICD-10-CM | POA: Diagnosis not present

## 2016-05-19 DIAGNOSIS — I5023 Acute on chronic systolic (congestive) heart failure: Secondary | ICD-10-CM | POA: Diagnosis not present

## 2016-05-19 DIAGNOSIS — Z888 Allergy status to other drugs, medicaments and biological substances status: Secondary | ICD-10-CM | POA: Diagnosis not present

## 2016-05-19 DIAGNOSIS — G4733 Obstructive sleep apnea (adult) (pediatric): Secondary | ICD-10-CM | POA: Diagnosis not present

## 2016-05-19 DIAGNOSIS — R079 Chest pain, unspecified: Secondary | ICD-10-CM | POA: Diagnosis not present

## 2016-05-19 DIAGNOSIS — E876 Hypokalemia: Secondary | ICD-10-CM | POA: Diagnosis not present

## 2016-05-19 DIAGNOSIS — I5033 Acute on chronic diastolic (congestive) heart failure: Secondary | ICD-10-CM | POA: Diagnosis not present

## 2016-05-19 DIAGNOSIS — I2089 Other forms of angina pectoris: Secondary | ICD-10-CM

## 2016-05-19 DIAGNOSIS — I255 Ischemic cardiomyopathy: Secondary | ICD-10-CM | POA: Diagnosis not present

## 2016-05-19 DIAGNOSIS — R0602 Shortness of breath: Secondary | ICD-10-CM | POA: Diagnosis not present

## 2016-05-19 DIAGNOSIS — I272 Other secondary pulmonary hypertension: Secondary | ICD-10-CM | POA: Diagnosis not present

## 2016-05-19 DIAGNOSIS — Z833 Family history of diabetes mellitus: Secondary | ICD-10-CM | POA: Diagnosis not present

## 2016-05-19 DIAGNOSIS — Z7902 Long term (current) use of antithrombotics/antiplatelets: Secondary | ICD-10-CM | POA: Diagnosis not present

## 2016-05-19 DIAGNOSIS — E1142 Type 2 diabetes mellitus with diabetic polyneuropathy: Secondary | ICD-10-CM | POA: Diagnosis not present

## 2016-05-19 DIAGNOSIS — I5020 Unspecified systolic (congestive) heart failure: Secondary | ICD-10-CM | POA: Diagnosis not present

## 2016-05-19 DIAGNOSIS — G2581 Restless legs syndrome: Secondary | ICD-10-CM | POA: Diagnosis not present

## 2016-05-19 DIAGNOSIS — I2510 Atherosclerotic heart disease of native coronary artery without angina pectoris: Secondary | ICD-10-CM | POA: Diagnosis not present

## 2016-05-19 DIAGNOSIS — Z7982 Long term (current) use of aspirin: Secondary | ICD-10-CM | POA: Diagnosis not present

## 2016-05-19 DIAGNOSIS — Z981 Arthrodesis status: Secondary | ICD-10-CM | POA: Diagnosis not present

## 2016-05-19 HISTORY — DX: Other forms of angina pectoris: I20.89

## 2016-05-19 HISTORY — PX: CARDIAC CATHETERIZATION: SHX172

## 2016-05-19 HISTORY — DX: Other forms of angina pectoris: I20.8

## 2016-05-19 LAB — GLUCOSE, CAPILLARY
Glucose-Capillary: 146 mg/dL — ABNORMAL HIGH (ref 65–99)
Glucose-Capillary: 147 mg/dL — ABNORMAL HIGH (ref 65–99)
Glucose-Capillary: 193 mg/dL — ABNORMAL HIGH (ref 65–99)

## 2016-05-19 LAB — CBC
HCT: 31.2 % — ABNORMAL LOW (ref 39.0–52.0)
Hemoglobin: 10.1 g/dL — ABNORMAL LOW (ref 13.0–17.0)
MCH: 31.6 pg (ref 26.0–34.0)
MCHC: 32.4 g/dL (ref 30.0–36.0)
MCV: 97.5 fL (ref 78.0–100.0)
Platelets: 217 10*3/uL (ref 150–400)
RBC: 3.2 MIL/uL — ABNORMAL LOW (ref 4.22–5.81)
RDW: 14.2 % (ref 11.5–15.5)
WBC: 6.8 10*3/uL (ref 4.0–10.5)

## 2016-05-19 LAB — BASIC METABOLIC PANEL
Anion gap: 8 (ref 5–15)
BUN: 20 mg/dL (ref 6–20)
CO2: 31 mmol/L (ref 22–32)
Calcium: 8.8 mg/dL — ABNORMAL LOW (ref 8.9–10.3)
Chloride: 101 mmol/L (ref 101–111)
Creatinine, Ser: 0.83 mg/dL (ref 0.61–1.24)
GFR calc Af Amer: >60 mL/min (ref >60)
GFR calc non Af Amer: >60 mL/min (ref >60)
Glucose, Bld: 144 mg/dL — ABNORMAL HIGH (ref 65–99)
Potassium: 3.2 mmol/L — ABNORMAL LOW (ref 3.5–5.1)
Sodium: 140 mmol/L (ref 135–145)

## 2016-05-19 SURGERY — RIGHT/LEFT HEART CATH AND CORONARY ANGIOGRAPHY
Anesthesia: LOCAL

## 2016-05-19 MED ORDER — CLOPIDOGREL BISULFATE 75 MG PO TABS: 75.0000 mg | ORAL_TABLET | Freq: Every day | ORAL | Status: DC

## 2016-05-19 MED ORDER — MIDAZOLAM HCL 2 MG/2ML IJ SOLN
INTRAMUSCULAR | Status: AC
  Filled 2016-05-19: qty 2

## 2016-05-19 MED ORDER — IOPAMIDOL (ISOVUE-370) INJECTION 76%
INTRAVENOUS | Status: AC
Start: 2016-05-19 — End: 2016-05-19
  Filled 2016-05-19: qty 50

## 2016-05-19 MED ORDER — FENTANYL CITRATE (PF) 100 MCG/2ML IJ SOLN
INTRAMUSCULAR | Status: AC
  Filled 2016-05-19: qty 2

## 2016-05-19 MED ORDER — LIDOCAINE HCL (PF) 1 % IJ SOLN
INTRAMUSCULAR | Status: DC | PRN
  Administered 2016-05-19: 2 mL via INTRADERMAL

## 2016-05-19 MED ORDER — HEPARIN (PORCINE) IN NACL 2-0.9 UNIT/ML-% IJ SOLN
INTRAMUSCULAR | Status: DC | PRN
  Administered 2016-05-19: 10 mL via INTRA_ARTERIAL

## 2016-05-19 MED ORDER — ENOXAPARIN SODIUM 40 MG/0.4ML ~~LOC~~ SOLN
40.0000 mg | SUBCUTANEOUS | Status: DC
  Administered 2016-05-20 – 2016-05-21 (×2): 40 mg via SUBCUTANEOUS
  Filled 2016-05-19 (×2): qty 0.4

## 2016-05-19 MED ORDER — ENOXAPARIN SODIUM 30 MG/0.3ML ~~LOC~~ SOLN: 30.0000 mg | SUBCUTANEOUS | Status: DC

## 2016-05-19 MED ORDER — HEPARIN SODIUM (PORCINE) 1000 UNIT/ML IJ SOLN
INTRAMUSCULAR | Status: AC
  Filled 2016-05-19: qty 1

## 2016-05-19 MED ORDER — IOPAMIDOL (ISOVUE-370) INJECTION 76%
INTRAVENOUS | Status: DC | PRN
  Administered 2016-05-19: 130 mL via INTRA_ARTERIAL

## 2016-05-19 MED ORDER — ONDANSETRON HCL 4 MG/2ML IJ SOLN: 4.0000 mg | Freq: Four times a day (QID) | INTRAMUSCULAR | Status: DC | PRN

## 2016-05-19 MED ORDER — FENTANYL CITRATE (PF) 100 MCG/2ML IJ SOLN
INTRAMUSCULAR | Status: DC | PRN
  Administered 2016-05-19 (×2): 50 ug via INTRAVENOUS

## 2016-05-19 MED ORDER — HEPARIN (PORCINE) IN NACL 2-0.9 UNIT/ML-% IJ SOLN
INTRAMUSCULAR | Status: AC
  Filled 2016-05-19: qty 1000

## 2016-05-19 MED ORDER — SODIUM CHLORIDE 0.9 % WEIGHT BASED INFUSION
1.0000 mL/kg/h | INTRAVENOUS | Status: AC
  Administered 2016-05-19: 1 mL/kg/h via INTRAVENOUS

## 2016-05-19 MED ORDER — MIDAZOLAM HCL 2 MG/2ML IJ SOLN
INTRAMUSCULAR | Status: DC | PRN
  Administered 2016-05-19: 1 mg via INTRAVENOUS

## 2016-05-19 MED ORDER — LIDOCAINE HCL (PF) 1 % IJ SOLN
INTRAMUSCULAR | Status: AC
  Filled 2016-05-19: qty 30

## 2016-05-19 MED ORDER — SODIUM CHLORIDE 0.9 % IV SOLN: 250.0000 mL | INTRAVENOUS | Status: DC | PRN

## 2016-05-19 MED ORDER — SODIUM CHLORIDE 0.9% FLUSH
3.0000 mL | Freq: Two times a day (BID) | INTRAVENOUS | Status: DC
  Administered 2016-05-19 – 2016-05-21 (×4): 3 mL via INTRAVENOUS

## 2016-05-19 MED ORDER — IOPAMIDOL (ISOVUE-370) INJECTION 76%
INTRAVENOUS | Status: AC
Start: 2016-05-19 — End: 2016-05-19
  Filled 2016-05-19: qty 100

## 2016-05-19 MED ORDER — POTASSIUM CHLORIDE CRYS ER 20 MEQ PO TBCR
40.0000 meq | EXTENDED_RELEASE_TABLET | Freq: Once | ORAL | Status: AC
  Administered 2016-05-19: 40 meq via ORAL
  Filled 2016-05-19: qty 2

## 2016-05-19 MED ORDER — SODIUM CHLORIDE 0.9% FLUSH: 3.0000 mL | INTRAVENOUS | Status: DC | PRN

## 2016-05-19 MED ORDER — VERAPAMIL HCL 2.5 MG/ML IV SOLN
INTRAVENOUS | Status: AC
  Filled 2016-05-19: qty 2

## 2016-05-19 MED ORDER — HEPARIN (PORCINE) IN NACL 2-0.9 UNIT/ML-% IJ SOLN
INTRAMUSCULAR | Status: DC | PRN
  Administered 2016-05-19: 1000 mL via INTRA_ARTERIAL

## 2016-05-19 MED ORDER — HEPARIN SODIUM (PORCINE) 1000 UNIT/ML IJ SOLN
INTRAMUSCULAR | Status: DC | PRN
  Administered 2016-05-19: 5000 [IU] via INTRAVENOUS

## 2016-05-19 SURGICAL SUPPLY — 14 items
CATH BALLN WEDGE 5F 110CM (CATHETERS) ×1 IMPLANT
CATH INFINITI 5 FR JL3.5 (CATHETERS) ×1 IMPLANT
CATH INFINITI JR4 5F (CATHETERS) ×1 IMPLANT
CATH LAUNCHER 5F RADR (CATHETERS) IMPLANT
CATHETER LAUNCHER 5F RADR (CATHETERS) ×2
DEVICE RAD COMP TR BAND LRG (VASCULAR PRODUCTS) ×1 IMPLANT
GLIDESHEATH SLEND A-KIT 6F 22G (SHEATH) ×1 IMPLANT
KIT HEART LEFT (KITS) ×2 IMPLANT
KIT HEART RIGHT NAMIC (KITS) ×2 IMPLANT
PACK CARDIAC CATHETERIZATION (CUSTOM PROCEDURE TRAY) ×2 IMPLANT
SHEATH FAST CATH BRACH 5F 5CM (SHEATH) ×1 IMPLANT
TRANSDUCER W/STOPCOCK (MISCELLANEOUS) ×3 IMPLANT
TUBING CIL FLEX 10 FLL-RA (TUBING) ×2 IMPLANT
WIRE SAFE-T 1.5MM-J .035X260CM (WIRE) ×1 IMPLANT

## 2016-05-19 NOTE — Interval H&P Note (Signed)
Cath Lab Visit (complete for each Cath Lab visit)  Clinical Evaluation Leading to the Procedure:   ACS: Yes.    Non-ACS:    Anginal Classification: CCS III  Anti-ischemic medical therapy: Minimal Therapy (1 class of medications)  Non-Invasive Test Results: No non-invasive testing performed  Prior CABG: No previous CABG      History and Physical Interval Note:  05/19/2016 3:44 PM  [name]  has presented today for surgery, with the diagnosis of cp  The various methods of treatment have been discussed with the patient and family. After consideration of risks, benefits and other options for treatment, the patient has consented to  Procedure(s): Right/Left Heart Cath and Coronary Angiography (N/A) as a surgical intervention .  The patient's history has been reviewed, patient examined, no change in status, stable for surgery.  I have reviewed the patient's chart and labs.  Questions were answered to the patient's satisfaction.     Belva Crome III

## 2016-05-19 NOTE — Progress Notes (Signed)
Nutrition Education Note  RD consulted for nutrition education regarding CHF.  RD provided "Heart Failure Nutrition Therapy" handout from the Academy of [employer] and Dietetics. Provided examples on ways to decrease sodium intake in diet. Discouraged intake of processed foods and use of salt shaker. Encouraged fresh fruits and vegetables as well as whole grain sources of carbohydrates to maximize fiber intake. Reviewed tips for using nutrition labels; discussed sodium guidelines per serving. RD offered to review patient's dietary recall with feedback. Pt states that he is hungry and finding it difficult to concentrate. He appreciated handouts. He is agreeable to dietitian checking back tomorrow when he is able to eat again.   Expect good compliance.  Body mass index is 32.39 kg/m. Pt meets criteria for Obesity based on current BMI.  Current diet order is NPO, patient was previously on Carb Modified diet and eating 100% of meals at this time. Labs and medications reviewed. No further nutrition interventions warranted at this time. RD contact information provided.   Scarlette Ar RD, LDN, CSP Inpatient Clinical Dietitian Pager: [phone] After Hours Pager: [phone]

## 2016-05-19 NOTE — Progress Notes (Signed)
Patient refusing CPAP for tonight. Stated he has been ok with oxygen. RT made pt aware that if he changed his mind to call.

## 2016-05-19 NOTE — Progress Notes (Signed)
   Patient was transferred here for catheterization today. It is scheduled for 1 PM. Morning labs reveal a potassium of 3.2. I have ordered a single dose of oral potassium.  Daryel November, MD

## 2016-05-19 NOTE — H&P (View-Only) (Signed)
Patient ID: WOODROE [name], male   DOB: [birth year], 70 y.o.   MRN: [id number]    Subjective:  Denies SSCP, palpitations or Dyspnea Breathing better   Objective:  Vitals:   05/17/16 1416 05/17/16 1954 05/17/16 2042 05/18/16 0438  BP: 116/61  126/67 133/78  Pulse: 79  90 76  Resp: 20     Temp: 98.1 F (36.7 C)  98.3 F (36.8 C) 97.9 F (36.6 C)  TempSrc: Oral  Oral Oral  SpO2: 95% 90% 95% 95%  Weight:      Height:        Intake/Output from previous day:  Intake/Output Summary (Last 24 hours) at 05/18/16 0835 Last data filed at 05/18/16 0816  Gross per 24 hour  Intake              720 ml  Output             1845 ml  Net            -1125 ml    Physical Exam: Affect appropriate Healthy:  appears stated age HEENT: normal Neck supple with no adenopathy JVP normal no bruits no thyromegaly Lungs clear with no wheezing and good diaphragmatic motion Heart:  S1/S2 no murmur, no rub, gallop or click PMI normal Abdomen: benighn, BS positve, no tenderness, no AAA no bruit.  No HSM or HJR Femoral bruit RLE in boot  No edema Neuro non-focal Skin warm and dry No muscular weakness   Lab Results: Basic Metabolic Panel:  Recent Labs  05/16/16 2244 05/17/16 0541  NA 135  --   K 3.6  --   CL 105  --   CO2 24  --   GLUCOSE 133*  --   BUN 13  --   CREATININE 0.70 0.88  CALCIUM 8.2*  --    CBC:  Recent Labs  05/16/16 2244 05/17/16 0541  WBC 12.3* 10.9*  NEUTROABS 10.8*  --   HGB 10.6* 11.0*  HCT 32.5* 34.1*  MCV 96.2 97.7  PLT 215 235   Cardiac Enzymes:  Recent Labs  05/17/16 0541 05/17/16 1100 05/17/16 1716  TROPONINI 0.04* 0.04* 0.03*   BNP: Invalid input(s): POCBNP D-Dimer: No results for input(s): DDIMER in the last 72 hours. Hemoglobin A1C:  Recent Labs  05/17/16 0541  HGBA1C 6.2*    Imaging: Dg Chest 2 View  Result Date: 05/16/2016 CLINICAL DATA:  Right lower leg surgery 6 weeks ago. Shortness of breath starting this morning.  Productive cough. EXAM: CHEST  2 VIEW COMPARISON:  04/11/2016 FINDINGS: Cardiac enlargement with pulmonary vascular congestion and interstitial edema. Blunting of the costophrenic angles consistent with small effusions. No pneumothorax. Mediastinal contours appear intact. Degenerative changes in the spine. IMPRESSION: Congestive changes with cardiac enlargement, pulmonary vascular congestion, interstitial edema, and small bilateral pleural effusions. Electronically Signed   By: [name] M.D.   On: 05/16/2016 22:06   Ct Angio Chest Pe W/cm &/or Wo Cm  Result Date: 05/17/2016 CLINICAL DATA:  Dyspnea and chest pain, onset this morning. EXAM: CT ANGIOGRAPHY CHEST WITH CONTRAST TECHNIQUE: Multidetector CT imaging of the chest was performed using the standard protocol during bolus administration of intravenous contrast. Multiplanar CT image reconstructions and MIPs were obtained to evaluate the vascular anatomy. CONTRAST:  100 mL Isovue 370 intravenous COMPARISON:  Radiograph 05/16/2016 FINDINGS: Cardiovascular: There is good opacification of the pulmonary arteries. There is no pulmonary embolism. The thoracic aorta is normal in caliber and intact. There is mild atherosclerotic calcification of  the thoracic aorta and coronary arteries. Lungs: There is interlobular septal thickening consistent with interstitial fluid. There are minimal atelectatic appearing lung base opacities adjacent to the small pleural effusions. Minimal nodularity is present bilaterally, measuring up to 6 mm, the largest centered on the fissures. These are more likely benign. Central airways: Patent Effusions: Small pleural effusions bilaterally. Lymphadenopathy: Nonspecific nodes in the hila and mediastinum. Esophagus: Unremarkable Upper abdomen: Incompletely imaged hypodense upper pole left renal lesion measuring at least 2.4 cm Musculoskeletal: No significant skeletal lesion. Moderate degenerative thoracic spine changes. Review of the  MIP images confirms the above findings. IMPRESSION: 1. Negative for acute pulmonary embolism. 2. Atherosclerotic calcification of the thoracic aorta and coronary arteries. 3. Interstitial fluid, small bilateral pleural effusions. Probable CHF. 4. **An incidental finding of potential clinical significance has been found. Scattered noncalcified nodules measuring up to 6 mm, indeterminate but more likely benign. Non-contrast chest CT at 3-6 months is recommended. If the nodules are stable at time of repeat CT, then future CT at 18-24 months (from today's scan) is considered optional for low-risk patients, but is recommended for high-risk patients. This recommendation follows the consensus statement: Guidelines for Management of Incidental Pulmonary Nodules Detected on CT Images:From the Fleischner Society 2017; published online before print (10.1148/radiol.SG:5268862).** 5. **An incidental finding of potential clinical significance has been found. There is a hypodense upper pole left renal lesion which is incompletely imaged, extending beyond the inferior edge of this study. It measures at least 2.4 cm. This could merely represent a simple cyst but characterization with ultrasound would be advisable to exclude a significant renal lesion.** Electronically Signed   By: [name] M.D.   On: 05/17/2016 02:03    Cardiac Studies:  ECG:  ST rate 104 nonspecific ST changes    Telemetry:  NSR 05/18/2016   Echo:   Medications:   . amLODipine  10 mg Oral Daily  . aspirin EC  81 mg Oral Daily  . clopidogrel  75 mg Oral Daily  . DULoxetine  20 mg Oral BID  . enoxaparin (LOVENOX) injection  40 mg Subcutaneous Q24H  . furosemide  60 mg Intravenous Q12H  . gabapentin  600 mg Oral QID  . insulin aspart  0-9 Units Subcutaneous TID WC  . levothyroxine  50 mcg Oral QAC breakfast  . metoprolol tartrate  12.5 mg Oral BID  . pravastatin  40 mg Oral BH-q7a  . rOPINIRole  0.5 mg Oral QHS  . saccharomyces  boulardii  250 mg Oral BID       Assessment/Plan:   Chest Pain:  Worrisome given DM and known PVD echo pending favor transfer [facility] for diagnostic cath Tomorrow will check echo first discussed with patient ECG no acute changes troponin ok  CHF:  Improved with diuresis echo pending likely right and left cath in am at [facility]  PVD:  Mild claudication should have outpatient f/u Dr Gwenlyn Found  Chol:  On statin  HTN: Well controlled.  Continue current medications and low sodium Dash type diet.    Ortho:  In boot post achilles surgery a month ago   Jenkins Rouge 05/18/2016, 8:35 AM

## 2016-05-20 ENCOUNTER — Encounter (HOSPITAL_COMMUNITY): Payer: Self-pay | Admitting: Interventional Cardiology

## 2016-05-20 DIAGNOSIS — G4733 Obstructive sleep apnea (adult) (pediatric): Secondary | ICD-10-CM | POA: Diagnosis not present

## 2016-05-20 DIAGNOSIS — E1151 Type 2 diabetes mellitus with diabetic peripheral angiopathy without gangrene: Secondary | ICD-10-CM | POA: Diagnosis not present

## 2016-05-20 DIAGNOSIS — Z833 Family history of diabetes mellitus: Secondary | ICD-10-CM | POA: Diagnosis not present

## 2016-05-20 DIAGNOSIS — I499 Cardiac arrhythmia, unspecified: Secondary | ICD-10-CM

## 2016-05-20 DIAGNOSIS — I2510 Atherosclerotic heart disease of native coronary artery without angina pectoris: Secondary | ICD-10-CM | POA: Diagnosis not present

## 2016-05-20 DIAGNOSIS — I471 Supraventricular tachycardia: Secondary | ICD-10-CM | POA: Diagnosis not present

## 2016-05-20 DIAGNOSIS — G2581 Restless legs syndrome: Secondary | ICD-10-CM | POA: Diagnosis not present

## 2016-05-20 DIAGNOSIS — E1142 Type 2 diabetes mellitus with diabetic polyneuropathy: Secondary | ICD-10-CM | POA: Diagnosis not present

## 2016-05-20 DIAGNOSIS — R079 Chest pain, unspecified: Secondary | ICD-10-CM | POA: Diagnosis not present

## 2016-05-20 DIAGNOSIS — I110 Hypertensive heart disease with heart failure: Secondary | ICD-10-CM | POA: Diagnosis not present

## 2016-05-20 DIAGNOSIS — Z8673 Personal history of transient ischemic attack (TIA), and cerebral infarction without residual deficits: Secondary | ICD-10-CM | POA: Diagnosis not present

## 2016-05-20 DIAGNOSIS — R7989 Other specified abnormal findings of blood chemistry: Secondary | ICD-10-CM | POA: Diagnosis not present

## 2016-05-20 DIAGNOSIS — E039 Hypothyroidism, unspecified: Secondary | ICD-10-CM | POA: Diagnosis not present

## 2016-05-20 DIAGNOSIS — I5033 Acute on chronic diastolic (congestive) heart failure: Secondary | ICD-10-CM | POA: Diagnosis not present

## 2016-05-20 HISTORY — DX: Cardiac arrhythmia, unspecified: I49.9

## 2016-05-20 LAB — POCT I-STAT 3, ART BLOOD GAS (G3+)
Acid-Base Excess: 5 mmol/L — ABNORMAL HIGH (ref 0.0–2.0)
Bicarbonate: 31.5 mmol/L — ABNORMAL HIGH (ref 20.0–28.0)
O2 Saturation: 97 %
TCO2: 33 mmol/L (ref 0–100)
pCO2 arterial: 52.7 mmHg — ABNORMAL HIGH (ref 32.0–48.0)
pH, Arterial: 7.385 (ref 7.350–7.450)
pO2, Arterial: 91 mmHg (ref 83.0–108.0)

## 2016-05-20 LAB — BASIC METABOLIC PANEL
Anion gap: 9 (ref 5–15)
BUN: 20 mg/dL (ref 6–20)
CO2: 32 mmol/L (ref 22–32)
Calcium: 9.2 mg/dL (ref 8.9–10.3)
Chloride: 96 mmol/L — ABNORMAL LOW (ref 101–111)
Creatinine, Ser: 1.12 mg/dL (ref 0.61–1.24)
GFR calc Af Amer: >60 mL/min (ref >60)
GFR calc non Af Amer: >60 mL/min (ref >60)
Glucose, Bld: 133 mg/dL — ABNORMAL HIGH (ref 65–99)
Potassium: 3.7 mmol/L (ref 3.5–5.1)
Sodium: 137 mmol/L (ref 135–145)

## 2016-05-20 LAB — MAGNESIUM: Magnesium: 2 mg/dL (ref 1.7–2.4)

## 2016-05-20 LAB — POCT I-STAT 3, VENOUS BLOOD GAS (G3P V)
Acid-Base Excess: 5 mmol/L — ABNORMAL HIGH (ref 0.0–2.0)
Bicarbonate: 31.3 mmol/L — ABNORMAL HIGH (ref 20.0–28.0)
O2 Saturation: 59 %
TCO2: 33 mmol/L (ref 0–100)
pCO2, Ven: 53.5 mmHg (ref 44.0–60.0)
pH, Ven: 7.376 (ref 7.250–7.430)
pO2, Ven: 32 mmHg (ref 32.0–45.0)

## 2016-05-20 LAB — GLUCOSE, CAPILLARY
Glucose-Capillary: 147 mg/dL — ABNORMAL HIGH (ref 65–99)
Glucose-Capillary: 185 mg/dL — ABNORMAL HIGH (ref 65–99)
Glucose-Capillary: 125 mg/dL — ABNORMAL HIGH (ref 65–99)
Glucose-Capillary: 187 mg/dL — ABNORMAL HIGH (ref 65–99)

## 2016-05-20 MED ORDER — LISINOPRIL 5 MG PO TABS
5.0000 mg | ORAL_TABLET | Freq: Every day | ORAL | Status: DC
  Administered 2016-05-20 – 2016-05-21 (×2): 5 mg via ORAL
  Filled 2016-05-20 (×2): qty 1

## 2016-05-20 MED ORDER — CARVEDILOL 3.125 MG PO TABS
3.1250 mg | ORAL_TABLET | Freq: Two times a day (BID) | ORAL | Status: DC
  Administered 2016-05-20 – 2016-05-21 (×2): 3.125 mg via ORAL
  Filled 2016-05-20 (×2): qty 1

## 2016-05-20 NOTE — Progress Notes (Signed)
Nutrition Education Note  RD consulted for nutrition education regarding new onset CHF.   Follow-up today to complete low sodium education. Reviewed patient's dietary recall. Provided examples on ways to decrease sodium intake in diet. Discussed tips for eating out.  RD provided "Sodium Content of Foods List" handout from the Academy of Nutrition and Dietetics.   RD discussed why it is important for patient to adhere to diet recommendations, and emphasized the role of fluids, foods to avoid, and importance of weighing self daily. Teach back method used.  Expect good compliance.  Body mass index is 31.38 kg/m. Pt meets criteria for Obesity based on current BMI.  Current diet order is Heart Healthy/Carb Modified, patient is consuming approximately 100% of meals at this time. Labs and medications reviewed. No further nutrition interventions warranted at this time. RD contact information provided. If additional nutrition issues arise, please re-consult RD.   Scarlette Ar RD, LDN, CSP Inpatient Clinical Dietitian Pager: [phone] After Hours Pager: [phone]

## 2016-05-20 NOTE — Progress Notes (Signed)
Patient Name: [name] Date of Encounter: 05/20/2016  Principal Problem:   Congestive heart failure ([place]) Active Problems:   Cardiomyopathy- EF NL 01/2013, now 30-35% echo   Angina decubitus ([place])   Arrhythmia   History of stroke June 2013   Hypertension   Type 2 diabetes mellitus with circulatory disorder (HCC)   Peripheral neuropathy (HCC)   Hyperlipidemia   Peripheral arterial disease (HCC)   Restless leg syndrome   Obstructive sleep apnea   Elevated troponin   Pain in the chest   Pulmonary nodule   Primary Cardiologist: Dr Harrington Challenger, Dr Gwenlyn Found Patient Profile: 70 yo male w/ hx of DM, HTN, sleep apnea, H/O CVA  In 2015, and PVD s/p CEA, and RLE PTA x 2 was admitted 08/28 w/ CP, CHF. Cards initially consulted, but assumed care.   SUBJECTIVE: No chest pain, no palpitations. The patient is feeling better with diuresis. Catheterization yesterday revealed significant coronary disease but no obstructive lesions requiring intervention. Filling pressures were high. There was left ventricular dysfunction by echo this admission. This appears to be a new diagnosis.  OBJECTIVE Vitals:   05/20/16 0840 05/20/16 0941 05/20/16 1100 05/20/16 1123  BP: 132/76 132/76  111/61  Pulse: 76 76  68  Resp: 20   18  Temp: 98 F (36.7 C)   97.6 F (36.4 C)  TempSrc: Oral     SpO2: 93%  94% 98%  Weight:      Height:        Intake/Output Summary (Last 24 hours) at 05/20/16 1238 Last data filed at 05/20/16 1222  Gross per 24 hour  Intake              720 ml  Output             2475 ml  Net            -1755 ml   Filed Weights   05/18/16 1530 05/19/16 0513 05/20/16 0519  Weight: 241 lb 14.4 oz (109.7 kg) 238 lb 12.8 oz (108.3 kg) 231 lb 6.4 oz (105 kg)    PHYSICAL EXAM General: Well developed, well nourished, male in no acute distress. Head: Normocephalic, atraumatic.  Neck: Supple without bruits, JVD minimal elevation. Lungs:  Resp regular and unlabored, CTA. Heart: RRR, S1, S2,  no S3, S4, or murmur; no rub. Abdomen: Soft, non-tender, non-distended, BS + x 4.  Extremities: No clubbing, cyanosis, edema. RLE immobilized Neuro: Alert and oriented X 3. Moves all extremities spontaneously. Psych: Normal affect.  LABS: CBC:  Recent Labs  05/19/16 0312  WBC 6.8  HGB 10.1*  HCT 31.2*  MCV 97.5  PLT [id number]   Basic Metabolic Panel:  Recent Labs  05/19/16 0312  NA 140  K 3.2*  CL 101  CO2 31  GLUCOSE 144*  BUN 20  CREATININE 0.83  CALCIUM 8.8*   Cardiac Enzymes:  Recent Labs  05/17/16 1716  TROPONINI 0.03*   BNP:  B Natriuretic Peptide  Date/Time Value Ref Range Status  05/16/2016 10:44 PM 944.0 (H) 0.0 - 100.0 pg/mL Final   TELE:   SR, ST, NSVT, SVT     ECHO: 08/29 - Left ventricle: The cavity size was moderately dilated. Wall   thickness was increased in a pattern of severe LVH. Systolic   function was moderately to severely reduced. The estimated   ejection fraction was in the range of 30% to 35%. Diffuse   hypokinesis. Doppler parameters are consistent with both elevated  ventricular end-diastolic filling pressure and elevated left   atrial filling pressure. - Left atrium: The atrium was moderately to severely dilated. - Atrial septum: No defect or patent foramen ovale was identified.  Cath: 08/31  Mid RCA to Dist RCA lesion, 50 %stenosed.  Dist RCA lesion, 45 %stenosed.  RPDA lesion, 50 %stenosed.  Dist LAD-2 lesion, 50 %stenosed.  Dist LAD-1 lesion, 80 %stenosed.  Mid LAD lesion, 50 %stenosed.  Prox LAD lesion, 40 %stenosed.  1st Mrg lesion, 85 %stenosed.  Hemodynamic findings consistent with moderate pulmonary hypertension.   Diffuse moderate to severe LAD disease. The most severe region is in the mid to distal vessel and is somewhat eccentric with up to 80% obstruction.  Widely patent RCA and circumflex.  Elevated left ventricular filling pressures with LVEDP of 32 and pulmonary capillary wedge pressure of 18  mmHg.  The procedure was very difficult due to significant tortuosity in the right subclavian/innominate artery ascending aortic junction. This prevented control of catheter movement. RECOMMENDATIONS:  Start Plavix if no contraindication  Optimize medical therapy for both heart failure and myocardial ischemia.  If symptomatic angina on medical therapy, consider femoral approach to treat the mid to distal LAD disease with stenting.  Radiology/Studies: Dg Chest 2 View Result Date: 05/16/2016 CLINICAL DATA:  Right lower leg surgery 6 weeks ago. Shortness of breath starting this morning. Productive cough. EXAM: CHEST  2 VIEW COMPARISON:  04/11/2016 FINDINGS: Cardiac enlargement with pulmonary vascular congestion and interstitial edema. Blunting of the costophrenic angles consistent with small effusions. No pneumothorax. Mediastinal contours appear intact. Degenerative changes in the spine. IMPRESSION: Congestive changes with cardiac enlargement, pulmonary vascular congestion, interstitial edema, and small bilateral pleural effusions. Electronically Signed   By: Lucienne Capers M.D.   On: 05/16/2016 22:06   Ct Angio Chest Pe W/cm &/or Wo Cm Result Date: 05/17/2016 CLINICAL DATA:  Dyspnea and chest pain, onset this morning. EXAM: CT ANGIOGRAPHY CHEST WITH CONTRAST TECHNIQUE: Multidetector CT imaging of the chest was performed using the standard protocol during bolus administration of intravenous contrast. Multiplanar CT image reconstructions and MIPs were obtained to evaluate the vascular anatomy. CONTRAST:  100 mL Isovue 370 intravenous COMPARISON:  Radiograph 05/16/2016 FINDINGS: Cardiovascular: There is good opacification of the pulmonary arteries. There is no pulmonary embolism. The thoracic aorta is normal in caliber and intact. There is mild atherosclerotic calcification of the thoracic aorta and coronary arteries. Lungs: There is interlobular septal thickening consistent with interstitial fluid.  There are minimal atelectatic appearing lung base opacities adjacent to the small pleural effusions. Minimal nodularity is present bilaterally, measuring up to 6 mm, the largest centered on the fissures. These are more likely benign. Central airways: Patent Effusions: Small pleural effusions bilaterally. Lymphadenopathy: Nonspecific nodes in the hila and mediastinum. Esophagus: Unremarkable Upper abdomen: Incompletely imaged hypodense upper pole left renal lesion measuring at least 2.4 cm Musculoskeletal: No significant skeletal lesion. Moderate degenerative thoracic spine changes. Review of the MIP images confirms the above findings. IMPRESSION: 1. Negative for acute pulmonary embolism. 2. Atherosclerotic calcification of the thoracic aorta and coronary arteries. 3. Interstitial fluid, small bilateral pleural effusions. Probable CHF. 4. **An incidental finding of potential clinical significance has been found. Scattered noncalcified nodules measuring up to 6 mm, indeterminate but more likely benign. Non-contrast chest CT at 3-6 months is recommended. If the nodules are stable at time of repeat CT, then future CT at 18-24 months (from today's scan) is considered optional for low-risk patients, but is recommended for high-risk patients. This  recommendation follows the consensus statement: Guidelines for Management of Incidental Pulmonary Nodules Detected on CT Images:From the Fleischner Society 2017; published online before print (10.1148/radiol.IJ:2314499).** 5. **An incidental finding of potential clinical significance has been found. There is a hypodense upper pole left renal lesion which is incompletely imaged, extending beyond the inferior edge of this study. It measures at least 2.4 cm. This could merely represent a simple cyst but characterization with ultrasound would be advisable to exclude a significant renal lesion.** Electronically Signed   By: Andreas Newport M.D.   On: 05/17/2016 02:03       Current Medications:  . amLODipine  10 mg Oral Daily  . aspirin EC  81 mg Oral Daily  . clopidogrel  75 mg Oral Daily  . DULoxetine  20 mg Oral BID  . enoxaparin (LOVENOX) injection  40 mg Subcutaneous Q24H  . furosemide  60 mg Intravenous BID  . gabapentin  600 mg Oral QID  . insulin aspart  0-9 Units Subcutaneous TID WC  . levothyroxine  50 mcg Oral QAC breakfast  . metoprolol tartrate  12.5 mg Oral BID  . pravastatin  40 mg Oral BH-q7a  . rOPINIRole  0.5 mg Oral QHS  . saccharomyces boulardii  250 mg Oral BID  . sodium chloride flush  3 mL Intravenous Q12H      ASSESSMENT AND PLAN: Principal Problem:   Congestive heart failure ([place]) - currently on IV Lasix, consider change to po rx later today or tomorrow - on BB, but with LVD, will change to Coreg 3.125 mg bid, add lisinopril 5 mg - d/c amlodipine.    Angina decubitus - see cath report - he is on ASA, Plavix, statin.  - see BB change above There is no indication for coronary intervention at this time.    Cardiomyopathy - ?ischemic. Exact etiology is not clear because the patient has not had an occluded vessel. As of today we will add an ace and change his beta blocker to carvedilol. Over time his meds need to be titrated up with repeat of echo to see the response. Ultimately he will need a follow-up echo to see if further EP evaluation is needed. - add ACE, if tolerates well, consider spiro or Entresto as OP - discussed daily weights with pt and wife. - needs low-Na diet    Arrhythmia - has WCT and narrow. He has definite supraventricular tachycardia on the monitor. There may be some ventricular tachycardia. We will continue to stabilize his electrolytes and treat his heart failure. I would recommend a follow-up 3 week event recorder to be sure that he is not having underlying sustained ventricular tachycardia in the face of left ventricular dysfunction. He has not had syncope or presyncope. - no sx from either -  change BB for CHF benefit and up-titrate as BP/HR will tolerate.    History of stroke June 2013 - continue ASA and Plavix    Hypertension - see changes above    Type 2 diabetes mellitus with circulatory disorder ([place]) - continue SSI - resume Amaryl at d/c    Abnl chest CT:  - needs repeat CT 3-6 mo for nodules - needs Kidney US for ?cyst, prob do as OP    Hypokalemia      Potassium will be treated.  Continue current rx for:   Peripheral neuropathy (HCC)   Hyperlipidemia   Peripheral arterial disease (HCC)   Restless leg syndrome   Obstructive sleep apnea   Elevated troponin  Pain in the chest   Pulmonary nodule  Signed, Lenoard Aden 12:38 PM 05/20/2016  Patient seen and examined. I agree with the assessment and plan as detailed above. See also my additional thoughts below.   I discussed with the patient and his wife the fact that he is not ready for discharge home today. I have entered my thoughts in the assessment and plan above. The diagnosis of left ventricular dysfunction and CHF are new. A diagnosis of coronary disease is new. He did not need a coronary intervention. We need to proceed with optimization of his volume status and electrolytes. Meds are being started for his heart failure and need to be titrated up with follow-up of his left ventricular dysfunction. We also need to carefully follow-up his arrhythmia.  Dola Argyle, MD, [facility] 05/20/2016 12:48 PM

## 2016-05-20 NOTE — Progress Notes (Signed)
Currently refused bed alarm & walking about in room performing ADL. Will continue to monitor patient.

## 2016-05-20 NOTE — Progress Notes (Signed)
Received order for "If PCI" however he did not receive PCI. Please order Cardiac Rehab Phase I if ambulation and education are appropriate. Thank you. Yves Dill CES, ACSM 7:49 AM 05/20/2016

## 2016-05-20 NOTE — Progress Notes (Signed)
Patient refusing CPAP for tonight. 

## 2016-05-21 DIAGNOSIS — I5020 Unspecified systolic (congestive) heart failure: Secondary | ICD-10-CM | POA: Diagnosis not present

## 2016-05-21 DIAGNOSIS — R7989 Other specified abnormal findings of blood chemistry: Secondary | ICD-10-CM | POA: Diagnosis not present

## 2016-05-21 DIAGNOSIS — I429 Cardiomyopathy, unspecified: Secondary | ICD-10-CM | POA: Diagnosis not present

## 2016-05-21 DIAGNOSIS — E785 Hyperlipidemia, unspecified: Secondary | ICD-10-CM | POA: Diagnosis not present

## 2016-05-21 DIAGNOSIS — I10 Essential (primary) hypertension: Secondary | ICD-10-CM

## 2016-05-21 DIAGNOSIS — I739 Peripheral vascular disease, unspecified: Secondary | ICD-10-CM

## 2016-05-21 LAB — GLUCOSE, CAPILLARY
Glucose-Capillary: 150 mg/dL — ABNORMAL HIGH (ref 65–99)
Glucose-Capillary: 170 mg/dL — ABNORMAL HIGH (ref 65–99)

## 2016-05-21 LAB — BASIC METABOLIC PANEL
Anion gap: 10 (ref 5–15)
BUN: 21 mg/dL — ABNORMAL HIGH (ref 6–20)
CO2: 26 mmol/L (ref 22–32)
Calcium: 9 mg/dL (ref 8.9–10.3)
Chloride: 102 mmol/L (ref 101–111)
Creatinine, Ser: 0.92 mg/dL (ref 0.61–1.24)
GFR calc Af Amer: >60 mL/min (ref >60)
GFR calc non Af Amer: >60 mL/min (ref >60)
Glucose, Bld: 144 mg/dL — ABNORMAL HIGH (ref 65–99)
Potassium: 3.9 mmol/L (ref 3.5–5.1)
Sodium: 138 mmol/L (ref 135–145)

## 2016-05-21 MED ORDER — LISINOPRIL 5 MG PO TABS: 5.0000 mg | ORAL_TABLET | Freq: Every day | ORAL | 11 refills | Status: DC

## 2016-05-21 MED ORDER — FUROSEMIDE 40 MG PO TABS: 40.0000 mg | ORAL_TABLET | Freq: Two times a day (BID) | ORAL | 3 refills | Status: DC

## 2016-05-21 MED ORDER — CARVEDILOL 3.125 MG PO TABS: 3.1250 mg | ORAL_TABLET | Freq: Two times a day (BID) | ORAL | 11 refills | Status: DC

## 2016-05-21 MED ORDER — POTASSIUM CHLORIDE CRYS ER 20 MEQ PO TBCR: 20.0000 meq | EXTENDED_RELEASE_TABLET | Freq: Every day | ORAL | 11 refills | Status: DC

## 2016-05-21 MED ORDER — ASPIRIN 81 MG PO TBEC: 81.0000 mg | DELAYED_RELEASE_TABLET | Freq: Every day | ORAL | Status: DC

## 2016-05-21 NOTE — Progress Notes (Signed)
Patient Name: GRADIE TRUMPY Date of Encounter: 05/21/2016  Principal Problem:   Congestive heart failure ([place]) Active Problems:   Cardiomyopathy- EF NL 01/2013, now 30-35% echo   Angina decubitus ([place])   Arrhythmia   History of stroke June 2013   Hypertension   Type 2 diabetes mellitus with circulatory disorder (HCC)   Peripheral neuropathy (HCC)   Hyperlipidemia   Peripheral arterial disease (HCC)   Restless leg syndrome   Obstructive sleep apnea   Elevated troponin   Pain in the chest   Pulmonary nodule   Primary Cardiologist: Dr Harrington Challenger, Dr Gwenlyn Found Patient Profile: 70 yo male w/ hx of DM, HTN, sleep apnea, H/O CVA In 2015, and PVD s/p CEA, and RLE PTA x 2 was admitted 08/28 w/ CP, CHF. Cards initially consulted, but assumed care.   SUBJECTIVE: Feels well, no CP, no SOB, no palpitations  OBJECTIVE Vitals:   05/20/16 1349 05/20/16 1629 05/20/16 1953 05/21/16 0547  BP: 122/64 122/64 (!) 120/57 (!) 146/64  Pulse: 70  75 77  Resp: 16  17 18   Temp: 98.2 F (36.8 C)  98.7 F (37.1 C) 97.3 F (36.3 C)  TempSrc: Oral  Oral Oral  SpO2: 97%  96% 95%  Weight:    223 lb 8 oz (101.4 kg)  Height:        Intake/Output Summary (Last 24 hours) at 05/21/16 1259 Last data filed at 05/21/16 0910  Gross per 24 hour  Intake             1200 ml  Output             2125 ml  Net             -925 ml   Filed Weights   05/19/16 0513 05/20/16 0519 05/21/16 0547  Weight: 238 lb 12.8 oz (108.3 kg) 231 lb 6.4 oz (105 kg) 223 lb 8 oz (101.4 kg)    PHYSICAL EXAM General: Well developed, well nourished, male in no acute distress. Head: Normocephalic, atraumatic.  Neck: Supple without bruits, JVD not elevated. Lungs:  Resp regular and unlabored, CTA. Heart: RRR, S1, S2, no S3, S4, or murmur; no rub. Abdomen: Soft, non-tender, non-distended, BS + x 4.  Extremities: No clubbing, cyanosis, edema.  Neuro: Alert and oriented X 3. Moves all extremities spontaneously. Psych: Normal  affect.  LABS: CBC: Recent Labs  05/19/16 0312  WBC 6.8  HGB 10.1*  HCT 31.2*  MCV 97.5  PLT A999333   Basic Metabolic Panel: Recent Labs  05/20/16 1449 05/21/16 0516  NA 137 138  K 3.7 3.9  CL 96* 102  CO2 32 26  GLUCOSE 133* 144*  BUN 20 21*  CREATININE 1.12 0.92  CALCIUM 9.2 9.0  MG 2.0  --    BNP:  B Natriuretic Peptide  Date/Time Value Ref Range Status  05/16/2016 10:44 PM 944.0 (H) 0.0 - 100.0 pg/mL Final   TELE:   SR     Current Medications:  . aspirin EC  81 mg Oral Daily  . carvedilol  3.125 mg Oral BID WC  . clopidogrel  75 mg Oral Daily  . DULoxetine  20 mg Oral BID  . enoxaparin (LOVENOX) injection  40 mg Subcutaneous Q24H  . furosemide  60 mg Intravenous BID  . gabapentin  600 mg Oral QID  . insulin aspart  0-9 Units Subcutaneous TID WC  . levothyroxine  50 mcg Oral QAC breakfast  . lisinopril  5 mg  Oral Daily  . pravastatin  40 mg Oral BH-q7a  . rOPINIRole  0.5 mg Oral QHS  . saccharomyces boulardii  250 mg Oral BID  . sodium chloride flush  3 mL Intravenous Q12H      ASSESSMENT AND PLAN: Principal Problem:   Congestive heart failure ([place]) - currently on IV Lasix, consider change to po rx later today or tomorrow - on BB, but with LVD, will change to Coreg 3.125 mg bid, add lisinopril 5 mg. - d/c amlodipine.    Angina decubitus - see cath report - he is on ASA, Plavix, statin.  - see BB change above There is no indication for coronary intervention at this time.    Cardiomyopathy - ?ischemic. Exact etiology is not clear because the patient has not had an occluded vessel. As of today we will add an ace and change his beta blocker to carvedilol. Over time his meds need to be titrated up with repeat of echo to see the response. Ultimately he will need a follow-up echo to see if further EP evaluation is needed. - add ACE, if tolerates well, consider spiro or Entresto as OP - discussed daily weights with pt and wife. - needs low-Na diet     Arrhythmia - has WCT and narrow. He has definite supraventricular tachycardia on the monitor. There may be some ventricular tachycardia. We will continue to stabilize his electrolytes and treat his heart failure. I would recommend a follow-up 3 week event recorder to be sure that he is not having underlying sustained ventricular tachycardia in the face of left ventricular dysfunction. He has not had syncope or presyncope. - no sx from either - BB changed for CHF benefit and up-titrate as BP/HR will tolerate.    History of stroke June 2013 - continue ASA and Plavix    Hypertension - see changes above    Type 2 diabetes mellitus with circulatory disorder ([place]) - continue SSI - resume Amaryl at d/c    Abnl chest CT:  - needs repeat CT 3-6 mo for nodules - needs Kidney US for ?cyst, prob do as OP    Hypokalemia      Potassium has been repleted, continue to follow.  Otherwise, per IM Principal Problem:   Congestive heart failure ([place]) Active Problems:   Cardiomyopathy- EF NL 01/2013, now 30-35% echo   Angina decubitus ([place])   Arrhythmia   History of stroke June 2013   Hypertension   Type 2 diabetes mellitus with circulatory disorder (HCC)   Peripheral neuropathy (HCC)   Hyperlipidemia   Peripheral arterial disease (HCC)   Restless leg syndrome   Obstructive sleep apnea   Elevated troponin   Pain in the chest   Pulmonary nodule   Signed, Barrett, Rhonda , PA-C 12:59 PM 05/21/2016  Agree with note by Rosaria Ferries PA-C  Admitted with CP and SOB Decline in EF new finding. Cath showed elevated LVEDP and 80% mid LAD stenosis. Med Rx. Excellent diuresis. Feeling clinically improved. Exam benign. Lungs clear. No periph edema. On approp meds. Appears euvolemic. OK for DC home this afternoon. ROV with MLP 2-3 weeks and with me 6-8 weeks.    [name], M.D., [place], [facility], [place] [address] [place], [place]   [id number]  [phone] 05/21/2016 1:27 PM

## 2016-05-21 NOTE — Discharge Summary (Signed)
Discharge Summary    Patient ID: [name],  MRN: [id number], DOB/AGE: 1946-02-11 70 y.o.  Admit date: 05/16/2016 Discharge date: 05/21/2016  Primary Care Provider: HAWKINS,EDWARD L Primary Cardiologist: Dr Gwenlyn Found  Discharge Diagnoses    Principal Problem:   Congestive heart failure [facility]) Active Problems:   Cardiomyopathy- EF NL 01/2013, now 30-35% echo   Angina decubitus ([place])   Arrhythmia   History of stroke June 2013   Hypertension   Type 2 diabetes mellitus with circulatory disorder (HCC)   Peripheral neuropathy (HCC)   Hyperlipidemia   Peripheral arterial disease (HCC)   Restless leg syndrome   Obstructive sleep apnea   Elevated troponin   Pain in the chest   Pulmonary nodule   Allergies Allergies  Allergen Reactions  . Codeine Nausea And Vomiting  . Tramadol Nausea Only    Diagnostic Studies/Procedures    Cath: 08/30  Mid RCA to Dist RCA lesion, 50 %stenosed.  Dist RCA lesion, 45 %stenosed.  RPDA lesion, 50 %stenosed.  Dist LAD-2 lesion, 50 %stenosed.  Dist LAD-1 lesion, 80 %stenosed.  Mid LAD lesion, 50 %stenosed.  Prox LAD lesion, 40 %stenosed.  1st Mrg lesion, 85 %stenosed.  Hemodynamic findings consistent with moderate pulmonary hypertension.   Diffuse moderate to severe LAD disease. The most severe region is in the mid to distal vessel and is somewhat eccentric with up to 80% obstruction.  Widely patent RCA and circumflex.  Elevated left ventricular filling pressures with LVEDP of 32 and pulmonary capillary wedge pressure of 18 mmHg.  The procedure was very difficult due to significant tortuosity in the right subclavian/innominate artery ascending aortic junction. This prevented control of catheter movement. RECOMMENDATIONS:  Start Plavix if no contraindication  Optimize medical therapy for both heart failure and myocardial ischemia.  If symptomatic angina on medical therapy, consider femoral approach to treat the mid to  distal LAD disease with stenting.  ECHO: 08/29 - Left ventricle: The cavity size was moderately dilated. Wall   thickness was increased in a pattern of severe LVH. Systolic   function was moderately to severely reduced. The estimated   ejection fraction was in the range of 30% to 35%. Diffuse   hypokinesis. Doppler parameters are consistent with both elevated   ventricular end-diastolic filling pressure and elevated left   atrial filling pressure. - Left atrium: The atrium was moderately to severely dilated. - Atrial septum: No defect or patent foramen ovale was identified. _____________   History of Present Illness     70 y/o overweight, Caucasian male with a history of DM, HTN, sleep apnea, H/O CVA  In 2015, and PVD s/p CEA, and RLE PTA x 2. He was admitted through the ED at [facility] 05/16/16 with chest pain and dyspnea.   Hospital Course     Consultants: none  He was diuresed with IV Lasix and lost 9 lbs during his stay. As he diuresed, his respiratory status improved. He was not on a diuretic PTA, but is on Lasix at discharge.  His chest pain was concerning for angina. He had a minimal elevation in his troponin, unclear if this was from angina or CHF Once his respiratory status improved, he had a cath, results above. An initial trial of medical therapy was recommended. Mr Barretta was started on ASA, BB, and an ACE. He was continued on Plavix and statin. He ambulated without chest pain or SOB, so is not on nitrates. Once his meds were adjusted, his BP was under good  control.  On 09/01, he was seen by Dr Gwenlyn Found and all data were reviewed. He was ambulating without chest pain or SOB. VS were stable. No further inpatient workup was indicated and he is considered stable for discharge, to follow up as an outpatient. SL NTG was not included on his d/c med list, but was sent in later.   _____________  Discharge Vitals Blood pressure 109/63, pulse 77, temperature 97.8 F (36.6 C), temperature  source Oral, resp. rate 18, height 6' (1.829 m), weight 223 lb 8 oz (101.4 kg), SpO2 97 %.  Filed Weights   05/19/16 0513 05/20/16 0519 05/21/16 0547  Weight: 238 lb 12.8 oz (108.3 kg) 231 lb 6.4 oz (105 kg) 223 lb 8 oz (101.4 kg)    Labs & Radiologic Studies    CBC  Recent Labs  05/19/16 0312  WBC 6.8  HGB 10.1*  HCT 31.2*  MCV 97.5  PLT A999333   Basic Metabolic Panel  Recent Labs  05/20/16 1449 05/21/16 0516  NA 137 138  K 3.7 3.9  CL 96* 102  CO2 32 26  GLUCOSE 133* 144*  BUN 20 21*  CREATININE 1.12 0.92  CALCIUM 9.2 9.0  MG 2.0  --    Troponin I  Date Value Ref Range Status  05/17/2016 0.03 (HH) <0.03 ng/mL Final    Comment:    CRITICAL VALUE NOTED.  VALUE IS CONSISTENT WITH PREVIOUSLY REPORTED AND CALLED VALUE.  05/17/2016 0.04 (HH) <0.03 ng/mL Final    Comment:    CRITICAL VALUE NOTED.  VALUE IS CONSISTENT WITH PREVIOUSLY REPORTED AND CALLED VALUE.  05/17/2016 0.04 (HH) <0.03 ng/mL Final    Comment:    CRITICAL RESULT CALLED TO, READ BACK BY AND VERIFIED WITH: DAVIS,L AT 7:25AM ON 05/17/16 BY FESTERMAN,C     _____________  Dg Chest 2 View  Result Date: 05/16/2016 CLINICAL DATA:  Right lower leg surgery 6 weeks ago. Shortness of breath starting this morning. Productive cough. EXAM: CHEST  2 VIEW COMPARISON:  04/11/2016 FINDINGS: Cardiac enlargement with pulmonary vascular congestion and interstitial edema. Blunting of the costophrenic angles consistent with small effusions. No pneumothorax. Mediastinal contours appear intact. Degenerative changes in the spine. IMPRESSION: Congestive changes with cardiac enlargement, pulmonary vascular congestion, interstitial edema, and small bilateral pleural effusions. Electronically Signed   By: Lucienne Capers M.D.   On: 05/16/2016 22:06   Ct Angio Chest Pe W/cm &/or Wo Cm  Result Date: 05/17/2016 CLINICAL DATA:  Dyspnea and chest pain, onset this morning. EXAM: CT ANGIOGRAPHY CHEST WITH CONTRAST TECHNIQUE:  Multidetector CT imaging of the chest was performed using the standard protocol during bolus administration of intravenous contrast. Multiplanar CT image reconstructions and MIPs were obtained to evaluate the vascular anatomy. CONTRAST:  100 mL Isovue 370 intravenous COMPARISON:  Radiograph 05/16/2016 FINDINGS: Cardiovascular: There is good opacification of the pulmonary arteries. There is no pulmonary embolism. The thoracic aorta is normal in caliber and intact. There is mild atherosclerotic calcification of the thoracic aorta and coronary arteries. Lungs: There is interlobular septal thickening consistent with interstitial fluid. There are minimal atelectatic appearing lung base opacities adjacent to the small pleural effusions. Minimal nodularity is present bilaterally, measuring up to 6 mm, the largest centered on the fissures. These are more likely benign. Central airways: Patent Effusions: Small pleural effusions bilaterally. Lymphadenopathy: Nonspecific nodes in the hila and mediastinum. Esophagus: Unremarkable Upper abdomen: Incompletely imaged hypodense upper pole left renal lesion measuring at least 2.4 cm Musculoskeletal: No significant skeletal lesion. Moderate  degenerative thoracic spine changes. Review of the MIP images confirms the above findings. IMPRESSION: 1. Negative for acute pulmonary embolism. 2. Atherosclerotic calcification of the thoracic aorta and coronary arteries. 3. Interstitial fluid, small bilateral pleural effusions. Probable CHF. 4. **An incidental finding of potential clinical significance has been found. Scattered noncalcified nodules measuring up to 6 mm, indeterminate but more likely benign. Non-contrast chest CT at 3-6 months is recommended. If the nodules are stable at time of repeat CT, then future CT at 18-24 months (from today's scan) is considered optional for low-risk patients, but is recommended for high-risk patients. This recommendation follows the consensus statement:  Guidelines for Management of Incidental Pulmonary Nodules Detected on CT Images:From the Fleischner Society 2017; published online before print (10.1148/radiol.IJ:2314499).** 5. **An incidental finding of potential clinical significance has been found. There is a hypodense upper pole left renal lesion which is incompletely imaged, extending beyond the inferior edge of this study. It measures at least 2.4 cm. This could merely represent a simple cyst but characterization with ultrasound would be advisable to exclude a significant renal lesion.** Electronically Signed   By: Andreas Newport M.D.   On: 05/17/2016 02:03   Disposition   Pt is being discharged home today in good condition.  Follow-up Plans & Appointments    Follow-up Information    Quay Burow, MD .   Specialties:  Cardiology, Radiology Why:  Lab work in 1 week, then office visit. The office will call.  Contact information: [address] [place] [address] [phone]          Discharge Instructions    (HEART FAILURE PATIENTS) Call MD:  Anytime you have any of the following symptoms: 1) 3 pound weight gain in 24 hours or 5 pounds in 1 week 2) shortness of breath, with or without a dry hacking cough 3) swelling in the hands, feet or stomach 4) if you have to sleep on extra pillows at night in order to breathe.    Complete by:  As directed   Diet - low sodium heart healthy    Complete by:  As directed   Increase activity slowly    Complete by:  As directed      Discharge Medications   Current Discharge Medication List    START taking these medications   Details  aspirin EC 81 MG EC tablet Take 1 tablet (81 mg total) by mouth daily.    carvedilol (COREG) 3.125 MG tablet Take 1 tablet (3.125 mg total) by mouth 2 (two) times daily with a meal. Qty: 60 tablet, Refills: 11    furosemide (LASIX) 40 MG tablet Take 1 tablet (40 mg total) by mouth 2 (two) times daily. Qty: 60 tablet, Refills: 3      lisinopril (PRINIVIL,ZESTRIL) 5 MG tablet Take 1 tablet (5 mg total) by mouth daily. Qty: 30 tablet, Refills: 11    potassium chloride SA (K-DUR,KLOR-CON) 20 MEQ tablet Take 1 tablet (20 mEq total) by mouth daily. Qty: 30 tablet, Refills: 11      CONTINUE these medications which have NOT CHANGED   Details  b complex vitamins tablet Take 1 tablet by mouth daily.    clopidogrel (PLAVIX) 75 MG tablet Take 75 mg by mouth daily.     gabapentin (NEURONTIN) 600 MG tablet Take 600 mg by mouth 4 (four) times daily.     glimepiride (AMARYL) 4 MG tablet Take 4 mg by mouth daily with breakfast.    levothyroxine (SYNTHROID, LEVOTHROID) 25 MCG  tablet Take 50 mcg by mouth daily.     Omega 3-6-9 Fatty Acids (OMEGA 3-6-9 COMPLEX) CAPS Take 1 capsule by mouth 2 (two) times daily.    pravastatin (PRAVACHOL) 40 MG tablet Take 40 mg by mouth every morning.     rOPINIRole (REQUIP) 0.5 MG tablet Take 1 tablet (0.5 mg total) by mouth at bedtime. Qty: 30 tablet, Refills: 11      STOP taking these medications     DULoxetine (CYMBALTA) 20 MG capsule      saccharomyces boulardii (FLORASTOR) 250 MG capsule           Outstanding Labs/Studies   None  Duration of Discharge Encounter   Greater than 30 minutes including physician time.  Jonetta Speak NP 05/21/2016, 2:01 PM

## 2016-05-21 NOTE — Care Management Important Message (Signed)
Important Message  Patient Details  Name: [name] MRN: [id number] Date of Birth: 1945-11-22   Medicare Important Message Given:  Yes    Orbie Pyo 05/21/2016, 10:21 AM

## 2016-05-22 ENCOUNTER — Other Ambulatory Visit: Payer: Self-pay | Admitting: Physician Assistant

## 2016-05-22 MED ORDER — NITROGLYCERIN 0.4 MG SL SUBL: 0.4000 mg | SUBLINGUAL_TABLET | SUBLINGUAL | 3 refills | Status: DC | PRN

## 2016-05-26 ENCOUNTER — Ambulatory Visit (INDEPENDENT_AMBULATORY_CARE_PROVIDER_SITE_OTHER): Payer: PPO | Admitting: Physician Assistant

## 2016-05-26 ENCOUNTER — Encounter: Payer: Self-pay | Admitting: Physician Assistant

## 2016-05-26 ENCOUNTER — Ambulatory Visit (INDEPENDENT_AMBULATORY_CARE_PROVIDER_SITE_OTHER): Payer: PPO | Admitting: Pharmacist Clinician (PhC)/ Clinical Pharmacy Specialist

## 2016-05-26 VITALS — BP 92/55 | HR 70 | Ht 72.0 in | Wt 236.0 lb

## 2016-05-26 DIAGNOSIS — I5022 Chronic systolic (congestive) heart failure: Secondary | ICD-10-CM

## 2016-05-26 DIAGNOSIS — I5020 Unspecified systolic (congestive) heart failure: Secondary | ICD-10-CM | POA: Diagnosis not present

## 2016-05-26 DIAGNOSIS — I739 Peripheral vascular disease, unspecified: Secondary | ICD-10-CM | POA: Diagnosis not present

## 2016-05-26 DIAGNOSIS — I429 Cardiomyopathy, unspecified: Secondary | ICD-10-CM

## 2016-05-26 DIAGNOSIS — I2510 Atherosclerotic heart disease of native coronary artery without angina pectoris: Secondary | ICD-10-CM

## 2016-05-26 LAB — BASIC METABOLIC PANEL WITH GFR
BUN: 15 mg/dL (ref 7–25)
CO2: 26 mmol/L (ref 20–31)
Calcium: 9.4 mg/dL (ref 8.6–10.3)
Chloride: 100 mmol/L (ref 98–110)
Creat: 1.01 mg/dL (ref 0.70–1.18)
GFR, Est African American: 86 mL/min (ref >=60)
GFR, Est Non African American: 74 mL/min (ref >=60)
Glucose, Bld: 129 mg/dL — ABNORMAL HIGH (ref 65–99)
Potassium: 4.5 mmol/L (ref 3.5–5.3)
Sodium: 136 mmol/L (ref 135–146)

## 2016-05-26 MED ORDER — LISINOPRIL 5 MG PO TABS: 2.5000 mg | ORAL_TABLET | Freq: Every day | ORAL | 11 refills | Status: DC

## 2016-05-26 NOTE — Assessment & Plan Note (Signed)
All medications have been reviewed with the patient.  He was given a detailed list of cardiac medications and their use.   Advised patient to switch furosemide doses to first thing in the morning, then again around 12-2 pm.

## 2016-05-26 NOTE — Patient Instructions (Addendum)
Medications for a Healthy Heart        My medicines help me. Live longer Feel better Stay out of the hospital      My prescription  Beta blocker    carvedilol  Beta blockers make your heart strong by blocking chemicals that make your heart work too hard. They may cause a slow heartbeat and low blood pressure.   ACE inhibitor/ARB    Lisinopril 5 mg  ACE inhibitors or ARBs will help to keep your blood pressure lower and to reduce strain on the heart. Call your doctor if you experience a dry cough or have lip/face tingling or swelling.  Statin     Pravastatin 40 mg  Statins will help to lower your cholesterol and prevent the build-up of plaque in your arteries that can lead to another heart attack and cause heart disease. Let your doctor know if you feel any muscle aches or pains.  Aspirin     Aspirin EC 81 mg  Aspirin helps to prevent future heart attacks and heart disease. Because it is an anti-platelet drug, it makes the blood thinner and may cause bleeds. Let your doctor know if it looks like there is blood in your stool or if you have any upcoming procedures or surgeries.  Anti-platelet      Clopidogrel 75 mg  You may be on another anti-platelet drug for at least a year after your heart attack. It works with aspirin to keep your blood thin and prevent future heart attacks. It may also cause bleeding-let your doctor know if  it looks like you have blood in your stool or if you have any upcoming procedures or surgeries.  Nitroglycerin    Nitrostat 0.4 mg  Nitroglycerin should only be used as needed if you experience chest pain at home. Call the hospital if you need to take more than 1 nitroglycerin. It may cause dizziness and headache.    And remember.  ? Cardiac rehab, exercise, and quitting smoking are some of the best ways to make your heart stronger and help you to live longer. ? Call us if you are interested in counseling or medications to help you quit smoking.

## 2016-05-26 NOTE — Patient Instructions (Addendum)
Medications:  Decrease Lisinopril to 2.5 mg daily.   Labwork:  Your physician recommends that you return for lab work today: BMET   Other Instructions:  Take in a maximum sodium level of 2000 mg daily.  Your physician recommends that you weigh, daily, at the same time every day, and in the same amount of clothing. Please record your daily weights on the handout provided and bring it to your next appointment.  Your physician has requested that you regularly monitor and record your blood pressure readings at home. Please use the same machine at the same time of day to check your readings and record them to bring to your follow-up visit.  --Call if you feel lightheaded or dizzy.   Follow-Up:  Follow up with Dr. Luan Pulling for restless legs.  Your physician recommends that you schedule a follow-up appointment in: 3 months with Dr. Gwenlyn Found.  If you need a refill on your cardiac medications before your next appointment, please call your pharmacy.

## 2016-05-26 NOTE — Progress Notes (Signed)
05/26/2016 [name] 1946/06/04 [id number]   HPI:  [name] is a 70 y.o. male patient of Dr Gwenlyn Found who was hospitalized from August 27 to September 1 with a primary diagnosis of congestive heart failure.    Patient presents to clinic for pharmacy transitions of care medication reconciliation after hospital discharge.   His only complaint about medications today is in regards to his furosemide.  He has been taking each morning, then again around 9 pm, causing him to be up much of the night, back and forth to the bathroom.     SCr - 79.6 using IBW LDL - 95 (March 2016)   Medication  Dose Contra-indication  Antiplatelet clopidogrel 75 mg   Aspirin EC  81 mg   Beta-blocker carvedilol 3.125 mg   ACEI/ARB lisinopril 5 mg   High intensity statin Pravastatin  40 mg   Nitroglycerin Nitrostat 0.4 mg    Issues/Concerns:  Current Outpatient Prescriptions  Medication Sig Dispense Refill  . aspirin EC 81 MG EC tablet Take 1 tablet (81 mg total) by mouth daily.    [name] b complex vitamins tablet Take 1 tablet by mouth daily.    . carvedilol (COREG) 3.125 MG tablet Take 1 tablet (3.125 mg total) by mouth 2 (two) times daily with a meal. 60 tablet 11  . clopidogrel (PLAVIX) 75 MG tablet Take 75 mg by mouth daily.     . furosemide (LASIX) 40 MG tablet Take 1 tablet (40 mg total) by mouth 2 (two) times daily. 60 tablet 3  . gabapentin (NEURONTIN) 600 MG tablet Take 600 mg by mouth 4 (four) times daily.     [name] glimepiride (AMARYL) 4 MG tablet Take 4 mg by mouth daily with breakfast.    . levothyroxine (SYNTHROID, LEVOTHROID) 25 MCG tablet Take 50 mcg by mouth daily.     [name] lisinopril (PRINIVIL,ZESTRIL) 5 MG tablet Take 1 tablet (5 mg total) by mouth daily. 30 tablet 11  . nitroGLYCERIN (NITROSTAT) 0.4 MG SL tablet Place 1 tablet (0.4 mg total) under the tongue every 5 (five) minutes as needed for chest pain. 25 tablet 3  . Omega 3-6-9 Fatty Acids (OMEGA 3-6-9 COMPLEX) CAPS Take 1 capsule by mouth 2  (two) times daily.    . potassium chloride SA (K-DUR,KLOR-CON) 20 MEQ tablet Take 1 tablet (20 mEq total) by mouth daily. 30 tablet 11  . pravastatin (PRAVACHOL) 40 MG tablet Take 40 mg by mouth every morning.     [name] rOPINIRole (REQUIP) 0.5 MG tablet Take 1 tablet (0.5 mg total) by mouth at bedtime. (Patient taking differently: Take 0.5 mg by mouth 2 (two) times daily. ) 30 tablet 11   No current facility-administered medications for this visit.     Allergies  Allergen Reactions  . Codeine Nausea And Vomiting  . Tramadol Nausea Only    Past Medical History:  Diagnosis Date  . Arthritis   . Cardiomyopathy- EF NL 01/2013, now 30-35% echo 03/14/2012  . Carotid artery occlusion    left s/p CEA  . Diabetes mellitus   . Hypertension   . Hypothyroidism   . Obstructive sleep apnea   . Peripheral arterial disease (HCC)    nonhealing ulcers bilaterally on each great toe  . PONV (postoperative nausea and vomiting)   . Restless leg syndrome   . Shortness of breath   . Stroke [facility]) March 08, 2012    All medications have been reviewed with the patient.  He was given a detailed  list of cardiac medications and their use.   Advised patient to switch furosemide doses to first thing in the morning, then again around 12-2 pm.    Tommy Medal PharmD CPP [facility]

## 2016-05-26 NOTE — Progress Notes (Signed)
Cardiology Office Note   Date:  05/26/2016   ID:  [name], DOB 05/19/1946, MRN [id number]  PCP:  [name], MD  Cardiologist:  Dr Stacy Gardner, PA-C   Chief Complaint  Patient presents with  . Hospitalization Follow-up    History of Present Illness: [name] is a 70 y.o. male with a history of DM, HTN, HLD, RLE PTA x 2, OSA, L-CEA, CVA.  D/C 09/01 after admit for CP/CHF>>cath w/ med rx (consider fem approach to LAD prn), echo w/ EF now 30-35%  [name] presents for Post hospital follow-up.  Since discharge from the hospital, he has done very well. His breathing is very good. His systolic blood pressure is 92 today in the office, but he is asymptomatic with this. He has had some occasional feelings of being lightheaded, but has not felt danger of falling or losing consciousness. He is interested in being compliant with his medications.  Until yesterday, he was in a lower leg immobilizer because of an injury, and he feels much more mobile now that that has been removed. He is having problems with restless legs and wonders what he can do about that.  Because of the leg immobilizer, he had not been weighing himself, was started today and will continue this going forward. He feels that he is compliant with a low sodium heart healthy diet. He is following his blood sugars and intense to control his diabetes is well.  He denies lower extremity edema, orthopnea, or PND. He has not had any leg pain with ambulation. He is having no problems with numbness.   Past Medical History:  Diagnosis Date  . Arthritis   . Cardiomyopathy- EF NL 01/2013, now 30-35% echo 03/14/2012  . Carotid artery occlusion    left s/p CEA  . Diabetes mellitus   . Hypertension   . Hypothyroidism   . Obstructive sleep apnea   . Peripheral arterial disease ([place]), s/p PTA x 2 RLE    nonhealing ulcers bilaterally on each great toe  . PONV (postoperative nausea and vomiting)    . Restless leg syndrome   . Shortness of breath   . Stroke [facility]) March 08, 2012    Past Surgical History:  Procedure Laterality Date  . ANGIOPLASTY  02/28/14   diamond back orbital rotational atherectomy of Rt. tibial  . BACK SURGERY    . CARDIAC CATHETERIZATION N/A 05/19/2016   Procedure: Right/Left Heart Cath and Coronary Angiography;  Surgeon: Belva Crome, MD;  Location: [facility];  Service: Cardiovascular;  Laterality: N/A;  . CERVICAL FUSION    . ENDARTERECTOMY Left 11/29/2013   Procedure: ENDARTERECTOMY CAROTID;  Surgeon: Serafina Mitchell, MD;  Location: [facility];  Service: Vascular;  Laterality: Left;  . EYE SURGERY    . FOOT SURGERY    . Lower ext duplex doppler  03/14/14   Rt ABI 1.2  . LOWER EXTREMITY ANGIOGRAM Bilateral 02/18/2014   Procedure: LOWER EXTREMITY ANGIOGRAM;  Surgeon: [name], MD;  Location: [facility] CATH LAB;  Service: Cardiovascular;  Laterality: Bilateral;  . LOWER EXTREMITY ANGIOGRAM N/A 10/31/2014   Procedure: LOWER EXTREMITY ANGIOGRAM;  Surgeon: [name], MD;  Location: [facility] CATH LAB;  Service: Cardiovascular;  Laterality: N/A;  . PV angiogram  02/18/2014   tibial vessel diseas bil.  [name] SPINE SURGERY    . tendon achillies lengthing and sesamoid      Current Outpatient Prescriptions  Medication Sig Dispense  Refill  . Thiamine Mononitrate (VITAMIN B1 PO) Take 1,000 mg by mouth 2 (two) times daily.    [name] aspirin EC 81 MG EC tablet Take 1 tablet (81 mg total) by mouth daily.    [name] b complex vitamins tablet Take 1 tablet by mouth daily.    . carvedilol (COREG) 3.125 MG tablet Take 1 tablet (3.125 mg total) by mouth 2 (two) times daily with a meal. 60 tablet 11  . clopidogrel (PLAVIX) 75 MG tablet Take 75 mg by mouth daily.     . furosemide (LASIX) 40 MG tablet Take 1 tablet (40 mg total) by mouth 2 (two) times daily. 60 tablet 3  . gabapentin (NEURONTIN) 600 MG tablet Take 600 mg by mouth 4 (four) times daily.     [name] glimepiride (AMARYL) 4 MG tablet  Take 4 mg by mouth daily with breakfast.    . levothyroxine (SYNTHROID, LEVOTHROID) 25 MCG tablet Take 50 mcg by mouth daily.     [name] lisinopril (PRINIVIL,ZESTRIL) 5 MG tablet Take 1 tablet (5 mg total) by mouth daily. 30 tablet 11  . nitroGLYCERIN (NITROSTAT) 0.4 MG SL tablet Place 1 tablet (0.4 mg total) under the tongue every 5 (five) minutes as needed for chest pain. 25 tablet 3  . Omega 3-6-9 Fatty Acids (OMEGA 3-6-9 COMPLEX) CAPS Take 1 capsule by mouth 2 (two) times daily.    . potassium chloride SA (K-DUR,KLOR-CON) 20 MEQ tablet Take 1 tablet (20 mEq total) by mouth daily. 30 tablet 11  . pravastatin (PRAVACHOL) 40 MG tablet Take 40 mg by mouth every morning.     [name] rOPINIRole (REQUIP) 0.5 MG tablet Take 1 tablet (0.5 mg total) by mouth at bedtime. (Patient taking differently: Take 0.5 mg by mouth 2 (two) times daily. ) 30 tablet 11   No current facility-administered medications for this visit.     Allergies:   Codeine and Tramadol    Social History:  The patient  reports that he quit smoking about 38 years ago. His smoking use included Pipe. He quit after 1.00 year of use. He has never used smokeless tobacco. He reports that he does not drink alcohol or use drugs.   Family History:  The patient's family history includes Diabetes in his father and son; Heart attack in his mother; Heart disease in his mother and son; Hypertension in his father, mother, and son.    ROS:  Please see the history of present illness. All other systems are reviewed and negative.    PHYSICAL EXAM: VS:  BP (!) 92/55   Pulse 70   Ht 6' (1.829 m)   Wt 236 lb (107 kg)   BMI 32.01 kg/m  , BMI Body mass index is 32.01 kg/m. GEN: Well nourished, well developed, male in no acute distress  HEENT: normal for age  Neck: no JVD, no carotid bruit, no masses Cardiac: RRR; no murmur, no rubs, or gallops Respiratory:  clear to auscultation bilaterally, normal work of breathing GI: soft, nontender, nondistended, +  BS MS: no deformity or atrophy; no edema; distal pulses are 2+ in upper extremities, not able to palpate DP/PT pulses in both lower extremities, but both feet are warm and capillary refill within normal limits Skin: warm and dry, no rash Neuro:  Strength and sensation are intact Psych: euthymic mood, full affect   EKG:  EKG is not ordered today.  Recent Labs: 05/16/2016: B Natriuretic Peptide 944.0 05/19/2016: Hemoglobin 10.1; Platelets 217 05/20/2016: Magnesium 2.0 05/21/2016: BUN 21;  Creatinine, Ser 0.92; Potassium 3.9; Sodium 138    Lipid Panel    Component Value Date/Time   CHOL 162 11/26/2014 0930   TRIG 127 11/26/2014 0930   HDL 42 11/26/2014 0930   CHOLHDL 3.9 11/26/2014 0930   VLDL 25 11/26/2014 0930   LDLCALC 95 11/26/2014 0930     Wt Readings from Last 3 Encounters:  05/26/16 236 lb (107 kg)  05/21/16 223 lb 8 oz (101.4 kg)  04/11/16 242 lb (109.8 kg)     Other studies Reviewed: Additional studies/ records that were reviewed today include: Office notes, hospital records and testing.  ASSESSMENT AND PLAN:  1.  Chronic systolic CHF: He is doing well with a low sodium diabetic diet. He is not over drinking. He is compliant with his medications. He may not need Lasix 40 mg twice a day. We will check a BMET today and he is to continue to track his weight. A decision on changing his Lasix dose will be based on symptoms and labs.  2. Hypotension: He is not extremely symptomatic from the systolic blood pressure in the 90s, but he is having some mild symptoms at times. We will decrease the lisinopril to 2.5 mg daily and continue the carvedilol at the current (low) dose. He is to continue to follow his blood pressure and symptoms at home.  3. Restless legs: Mr. Gromek was to know who he can ask about that. He is on Requip prescribed by Dr. Georgiann Cocker. I suggested that he contact Dr. Luan Pulling for further treatment.   Current medicines are reviewed at length with the patient  today.  The patient does not have concerns regarding medicines.  The following changes have been made:  Decrease lisinopril  Labs/ tests ordered today include:   Orders Placed This Encounter  Procedures  . BASIC METABOLIC PANEL WITH GFR     Disposition:   FU with Dr. Gwenlyn Found  Signed, Rosaria Ferries, PA-C  05/26/2016 1:37 PM    [facility] Phone: [phone]; Fax: [phone]  This note was written with the assistance of speech recognition software. Please excuse any transcriptional errors.

## 2016-06-01 ENCOUNTER — Telehealth: Payer: Self-pay | Admitting: Physician Assistant

## 2016-06-01 NOTE — Telephone Encounter (Signed)
Pt has a sinking feel and he is lightheaded.Suanne Marker told him if he felt like this to please call the office He does not have any other symptoms at this time.

## 2016-06-01 NOTE — Telephone Encounter (Signed)
Returned call to patient-pt reports since OV and medication change on 9/6 he is feeling better and dizziness and lightheadedness is resolving.  Reports BP is gradually increasing.  Reports having a bad episode on Friday where he was at [employer] and almost passed out, the employee caught him and after he sat down for a little while he felt better, -LOC.  Reports BP readings:  Date:  AM:  PM: 9/9  113/60 9/10  126/69 9/11  115/62  135/76 9/12  133/70  Advised to continue to monitor BP and call office if dizziness/lightheaded episodes return.  Pt verbalized understanding.    Routed to [facility] to make aware.

## 2016-06-09 ENCOUNTER — Telehealth: Payer: Self-pay | Admitting: Cardiovascular Disease

## 2016-06-09 NOTE — Telephone Encounter (Addendum)
Pt called to let rhonda know has been continuing to have near syncopal episodes. Had these a couple times last week - also had an episode where he states he passed out completely. He informs me he was instructed to call if this happens. Patient has not gone to ER. He was given instruction to reduce lisinopril at last OV - currently on 2.5mg  daily. Pt did not have BPs to report, but was having hypotensive episodes when seen.  He gave number for his cell phone, for PA to reach him there if not available at home line: [phone].

## 2016-06-09 NOTE — Telephone Encounter (Signed)
Okay to discontinue lisinopril, daily blood pressure log and follow up with Kristen into 3 weeks to review

## 2016-06-09 NOTE — Telephone Encounter (Signed)
Left msg for patient to call. 

## 2016-06-09 NOTE — Telephone Encounter (Signed)
[name] out of office today - pt of Dr. Gwenlyn Found - will seek recommendations from MD OK to d/c lisinopril?

## 2016-06-09 NOTE — Telephone Encounter (Signed)
New message    Pt verbalized that he is returning call for [facility]

## 2016-06-10 DIAGNOSIS — M545 Low back pain: Secondary | ICD-10-CM | POA: Diagnosis not present

## 2016-06-10 DIAGNOSIS — I2510 Atherosclerotic heart disease of native coronary artery without angina pectoris: Secondary | ICD-10-CM | POA: Diagnosis not present

## 2016-06-10 DIAGNOSIS — Z23 Encounter for immunization: Secondary | ICD-10-CM | POA: Diagnosis not present

## 2016-06-10 DIAGNOSIS — E1121 Type 2 diabetes mellitus with diabetic nephropathy: Secondary | ICD-10-CM | POA: Diagnosis not present

## 2016-06-10 DIAGNOSIS — I10 Essential (primary) hypertension: Secondary | ICD-10-CM | POA: Diagnosis not present

## 2016-06-10 NOTE — Telephone Encounter (Signed)
New message  ° ° °Pt verbalized that he is returning call for rn °

## 2016-06-10 NOTE — Telephone Encounter (Signed)
Pt given recommendations. He will come for return visit for BP management w pharmD on 10/12. Aware to make changes to lisinopril.  He notes some aberrances where his BP cuff has had high readings but he thinks this is related to putting cuff on too tight. Also states this is an old cuff, and today when checked at PCP office he had a 40-50 pt discrepancy between their reading ([id number] systolic) and his ([id number]) at home. Advised that if the cuff is several years old, probably best to go ahead and replace this. We discussed considerations for purchasing an auto BP cuff for home use.  Pt voiced thanks and understanding of recommendations and is aware to call if new questions or concerns in interim.

## 2016-06-14 ENCOUNTER — Telehealth: Payer: Self-pay | Admitting: *Deleted

## 2016-06-14 NOTE — Telephone Encounter (Signed)
Pt given PA appt this Thursday w Rhonda. He is agreeable to this. He will continue to monitor BPs and call if sooner concerns prior to appt. He voiced thanks for the call and acknowledgment of information received.

## 2016-06-14 NOTE — Telephone Encounter (Signed)
Fu paov with Dr Gwenlyn Found or PA Kirk Ruths

## 2016-06-14 NOTE — Telephone Encounter (Signed)
Spoke to patient. We discussed hypotensive episodes last week. He notes he's been having recurrent problems with "sinking" feeling/lightheadedness. No other symptoms reported. He was discontinued from lisinopril last week.  Notes often the sensation occurs after meals. Today took BP before and after eating, noting the sinking sensation occurred after breakfast.  BP pre-meal - 169/92 Post-meal 101/57  Notes use of same cuff. Last week we were concerned that BP discrepancies between home and PCP office cuff may be related to equipment error.  He has BP clinic f/u on 10/12. Routed to [employer] for recommendations.

## 2016-06-16 ENCOUNTER — Encounter: Payer: Self-pay | Admitting: Physician Assistant

## 2016-06-16 ENCOUNTER — Ambulatory Visit (INDEPENDENT_AMBULATORY_CARE_PROVIDER_SITE_OTHER): Payer: PPO | Admitting: Physician Assistant

## 2016-06-16 VITALS — BP 105/63 | HR 82 | Ht 72.0 in | Wt 236.0 lb

## 2016-06-16 DIAGNOSIS — I951 Orthostatic hypotension: Secondary | ICD-10-CM | POA: Diagnosis not present

## 2016-06-16 DIAGNOSIS — I429 Cardiomyopathy, unspecified: Secondary | ICD-10-CM | POA: Diagnosis not present

## 2016-06-16 DIAGNOSIS — I739 Peripheral vascular disease, unspecified: Secondary | ICD-10-CM | POA: Diagnosis not present

## 2016-06-16 LAB — BASIC METABOLIC PANEL WITH GFR
BUN: 24 mg/dL (ref 7–25)
CO2: 26 mmol/L (ref 20–31)
Calcium: 9 mg/dL (ref 8.6–10.3)
Chloride: 99 mmol/L (ref 98–110)
Creat: 1.33 mg/dL — ABNORMAL HIGH (ref 0.70–1.18)
GFR, Est African American: 61 mL/min (ref >=60)
GFR, Est Non African American: 53 mL/min — ABNORMAL LOW (ref >=60)
Glucose, Bld: 128 mg/dL — ABNORMAL HIGH (ref 65–99)
Potassium: 3.7 mmol/L (ref 3.5–5.3)
Sodium: 137 mmol/L (ref 135–146)

## 2016-06-16 NOTE — Patient Instructions (Signed)
Medications:  Restart Lasix 40 mg on Saturday, September 30. Do Not take until then.   Labwork:  Your physician recommends that you return for lab work today--BMET.   Other Instructions:  Your physician has requested that you continue to regularly monitor and record your blood pressure readings at home. Please use the same machine at the same time of day to check your readings and record them to bring to your follow-up visit.  Take in 1.5 Liters of fluid daily.   Follow-Up:  Please keep your upcoming appointment on 07/01/16 at 10:15 am with our pharmacist for a BP chaeck. Bring your BP cuff from home for this visit.  If you need a refill on your cardiac medications before your next appointment, please call your pharmacy.

## 2016-06-16 NOTE — Progress Notes (Signed)
Cardiology Office Note   Date:  06/16/2016   ID:  [name], [name] 04-Mar-1946, MRN [id number]  PCP:  [name], MD  Cardiologist:  Dr Stacy Gardner, PA-C    History of Present Illness: [name] is a 70 y.o. male with a history of DM, HTN, HLD, RLE PTA x 2, OSA, L-CEA, CVA. Cath 05/19/2016 w/ med rx (consider fem approach to LAD prn), EF now 30-35% by echo  Seen in the office with near-syncope, CHF stable, lisinopril decreased He has continued to have episodes, has kept a symptom diary, comes back in for mgt.  [name] presents for assessment and management of his blood pressure issues.  He has been compliant with his medications. He tends to drink quite a bit of water. He gets very thirsty sometimes, especially at night. He has had multiple problems with feeling like he is dizzy when he tries to get up and do anything. This problem is worse after he takes his medications. He will feel better in general first thing in the morning. He will take his medications, and then after that, his blood pressure drops.  Orthostatic vital signs were checked here in the office today.  Lying: he was 113/61 with heart rate of 75 sitting: 71/49 with a heart rate of 77 standing: 68/41 with a heart rate of 83  When he stood, he was lightheaded and dizzy and felt very weak. No attempt was made to do standing blood pressure 3 minutes.  He sat down, drank a glass of water, and felt much better. He stated that he didn't feel very thirsty before we gave him the water.  He has had no lower extremity edema. He's had no chest pain with exertion. Other than being lightheaded, he's had no new symptoms, no dyspnea on exertion, no orthopnea, no PND.    Past Medical History:  Diagnosis Date  . Arthritis   . Cardiomyopathy- EF NL 01/2013, now 30-35% echo 03/14/2012  . Carotid artery occlusion    left s/p CEA  . Diabetes mellitus   . Hypertension   . Hypothyroidism   .  Obstructive sleep apnea   . Peripheral arterial disease ([place]), s/p PTA x 2 RLE    nonhealing ulcers bilaterally on each great toe  . PONV (postoperative nausea and vomiting)   . Restless leg syndrome   . Shortness of breath   . Stroke [facility]) March 08, 2012    Past Surgical History:  Procedure Laterality Date  . ANGIOPLASTY  02/28/14   diamond back orbital rotational atherectomy of Rt. tibial  . BACK SURGERY    . CARDIAC CATHETERIZATION N/A 05/19/2016   Procedure: Right/Left Heart Cath and Coronary Angiography;  Surgeon: Belva Crome, MD;  Location: [facility];  Service: Cardiovascular;  Laterality: N/A;  . CERVICAL FUSION    . ENDARTERECTOMY Left 11/29/2013   Procedure: ENDARTERECTOMY CAROTID;  Surgeon: Serafina Mitchell, MD;  Location: [facility];  Service: Vascular;  Laterality: Left;  . EYE SURGERY    . FOOT SURGERY    . Lower ext duplex doppler  03/14/14   Rt ABI 1.2  . LOWER EXTREMITY ANGIOGRAM Bilateral 02/18/2014   Procedure: LOWER EXTREMITY ANGIOGRAM;  Surgeon: [name], MD;  Location: [facility] CATH LAB;  Service: Cardiovascular;  Laterality: Bilateral;  . LOWER EXTREMITY ANGIOGRAM N/A 10/31/2014   Procedure: LOWER EXTREMITY ANGIOGRAM;  Surgeon: [name], MD;  Location: [facility] CATH LAB;  Service: Cardiovascular;  Laterality: N/A;  . PV angiogram  02/18/2014   tibial vessel diseas bil.  [name] SPINE SURGERY    . tendon achillies lengthing and sesamoid      Current Outpatient Prescriptions  Medication Sig Dispense Refill  . aspirin EC 81 MG EC tablet Take 1 tablet (81 mg total) by mouth daily.    [name] b complex vitamins tablet Take 1 tablet by mouth daily.    . carvedilol (COREG) 3.125 MG tablet Take 1 tablet (3.125 mg total) by mouth 2 (two) times daily with a meal. 60 tablet 11  . clopidogrel (PLAVIX) 75 MG tablet Take 75 mg by mouth daily.     . furosemide (LASIX) 40 MG tablet Take 1 tablet (40 mg total) by mouth 2 (two) times daily. 60 tablet 3  . gabapentin (NEURONTIN) 600 MG  tablet Take 600 mg by mouth 4 (four) times daily.     [name] glimepiride (AMARYL) 4 MG tablet Take 4 mg by mouth daily with breakfast.    . levothyroxine (SYNTHROID, LEVOTHROID) 25 MCG tablet Take 50 mcg by mouth daily.     [name] lisinopril (PRINIVIL,ZESTRIL) 5 MG tablet Take 0.5 tablets (2.5 mg total) by mouth daily. 30 tablet 11  . nitroGLYCERIN (NITROSTAT) 0.4 MG SL tablet Place 1 tablet (0.4 mg total) under the tongue every 5 (five) minutes as needed for chest pain. 25 tablet 3  . Omega 3-6-9 Fatty Acids (OMEGA 3-6-9 COMPLEX) CAPS Take 1 capsule by mouth 2 (two) times daily.    . potassium chloride SA (K-DUR,KLOR-CON) 20 MEQ tablet Take 1 tablet (20 mEq total) by mouth daily. 30 tablet 11  . pravastatin (PRAVACHOL) 40 MG tablet Take 40 mg by mouth every morning.     [name] rOPINIRole (REQUIP) 0.5 MG tablet Take 1 tablet (0.5 mg total) by mouth at bedtime. (Patient taking differently: Take 0.5 mg by mouth 2 (two) times daily. ) 30 tablet 11  . Thiamine Mononitrate (VITAMIN B1 PO) Take 1,000 mg by mouth 2 (two) times daily.     No current facility-administered medications for this visit.     Allergies:   Codeine and Tramadol    Social History:  The patient  reports that he quit smoking about 38 years ago. His smoking use included Pipe. He quit after 1.00 year of use. He has never used smokeless tobacco. He reports that he does not drink alcohol or use drugs.   Family History:  The patient's family history includes Diabetes in his father and son; Heart attack in his mother; Heart disease in his mother and son; Hypertension in his father, mother, and son.    ROS:  Please see the history of present illness. All other systems are reviewed and negative.    PHYSICAL EXAM: VS:  BP 105/63   Pulse 82   Ht 6' (1.829 m)   Wt 236 lb (107 kg)   BMI 32.01 kg/m  , BMI Body mass index is 32.01 kg/m. GEN: Well nourished, well developed, male in no acute distress  HEENT: normal for age  Neck: no JVD, no carotid  bruit, no masses Cardiac: RRR; no murmur, no rubs, or gallops Respiratory:  clear to auscultation bilaterally, normal work of breathing GI: soft, nontender, nondistended, + BS MS: no deformity or atrophy; no edema; distal pulses are 2+ in all 4 extremities   Skin: warm and dry, no rash Neuro:  Strength and sensation are intact Psych: euthymic mood, full affect   EKG:  EKG is not ordered  today.  Recent Labs: 05/16/2016: B Natriuretic Peptide 944.0 05/19/2016: Hemoglobin 10.1; Platelets 217 05/20/2016: Magnesium 2.0 05/26/2016: BUN 15; Creat 1.01; Potassium 4.5; Sodium 136    Lipid Panel    Component Value Date/Time   CHOL 162 11/26/2014 0930   TRIG 127 11/26/2014 0930   HDL 42 11/26/2014 0930   CHOLHDL 3.9 11/26/2014 0930   VLDL 25 11/26/2014 0930   LDLCALC 95 11/26/2014 0930     Wt Readings from Last 3 Encounters:  06/16/16 236 lb (107 kg)  05/26/16 236 lb (107 kg)  05/21/16 223 lb 8 oz (101.4 kg)     Other studies Reviewed: Additional studies/ records that were reviewed today include: Office notes and other records.  ASSESSMENT AND PLAN:  1.  Chronic systolic CHF: He is dry. His orthostatic vital signs were positive. We need to improve his final status, but still not allow him to become volume overloaded. This was discussed extensively with the patient and his wife. They understand the need to decrease his diuretics temporarily to manage his volume, but still keep an eye on his fluid status. He is encouraged to limit fluids to a liter and a half daily, watch the sodium and continue to monitor his weight. Lasix will be restarted  2. Orthostatic hypotension: He needs a Lasix free interval to allow his blood pressure to improve. Therefore, he is to hold the Lasix for 48 hours, and restart on 06/20/2015. This should allow him enough of the Lasix free interval that he can allow his volume to build up a little bit. He understands that he is not supposed to gain more than 2 or 3  pounds during this interval. If he gains more than that he is to contact us for advice. Check a BMET today to make sure that he is not overly dry. He is not on an ACE inhibitor or an ARB. He is to the potassium as well, that did not make the AVS, he was instructed on this by phone..   Current medicines are reviewed at length with the patient today.  The patient does not have concerns regarding medicines.  The following changes have been made:    Labs/ tests ordered today include:   Orders Placed This Encounter  Procedures  . BASIC METABOLIC PANEL WITH GFR     Disposition:   FU with Dr. Gwenlyn Found  Signed, Rosaria Ferries, PA-C  06/16/2016 4:53 PM    [facility] Phone: [phone]; Fax: [phone]  This note was written with the assistance of speech recognition software. Please excuse any transcriptional errors.

## 2016-06-17 ENCOUNTER — Telehealth: Payer: Self-pay | Admitting: Physician Assistant

## 2016-06-17 ENCOUNTER — Ambulatory Visit: Payer: PPO | Admitting: Physician Assistant

## 2016-06-17 NOTE — Telephone Encounter (Signed)
-----   Message from Lonn Georgia, PA-C sent at 06/17/2016  2:29 PM EDT ----- Please let him know his Cr was up a little, demonstrating that he is a little dry. No change in plan, make sure he watches his sodium and tracks his weight. Also make sure his symptoms are improving.

## 2016-06-17 NOTE — Telephone Encounter (Signed)
Spoke to pt. Gave lab results and advised to watch sodium, staying around 2g of sodium daily and to track and record his weight daily to bring in to next appointment. Pt voiced understanding and stated he has had no more symptoms since last visit and is doing great.

## 2016-06-22 ENCOUNTER — Ambulatory Visit (INDEPENDENT_AMBULATORY_CARE_PROVIDER_SITE_OTHER): Payer: PPO | Admitting: Podiatry

## 2016-06-22 DIAGNOSIS — B351 Tinea unguium: Secondary | ICD-10-CM | POA: Diagnosis not present

## 2016-06-22 DIAGNOSIS — M79676 Pain in unspecified toe(s): Secondary | ICD-10-CM

## 2016-06-22 NOTE — Progress Notes (Signed)
Patient ID: [name], male   DOB: August 25, 1946, 70 y.o.   MRN: [id number]   Subjective: This patient presents today complaining of thickened and elongated toenails which are cough walking wearing shoes and requests toenail debridement. Patient has had history of open wound on the right foot which was treated by Dr. Prudy Feeler in [place] with surgical resection of the underlying bone and probable TAL or gastroc release. This treatment was performed in 2017 and the wound has resolved at this time.  Objective: Orientated 3 DP pulses 2/4 bilaterally PT pulses 2/4 bilaterally Capillary reflex immediate bilaterally Sensation to 10 g monofilament wire intact 1/5 bilaterally Vibratory sensation nonreactive bilaterally Ankle reflexes reactive bilaterally No open skin lesions bilaterally The toenails are elongated, brittle, deformed, discolored and tender to direct palpation 6-10 Hammertoe second right and hammertoe second and third left  Assessment: Resolve plantar ulcer right Mycotic toenails with symptoms 6-10 Diabetic peripheral neuropathy  Plan: Debridement of toenails 6-10 mechanically analytic without any bleeding Patient has prescription for pending diabetic shoes with rocker soles  Reappoint 3 months

## 2016-06-22 NOTE — Patient Instructions (Signed)
Diabetes and Foot Care Diabetes may cause you to have problems because of poor blood supply (circulation) to your feet and legs. This may cause the skin on your feet to become thinner, break easier, and heal more slowly. Your skin may become dry, and the skin may peel and crack. You may also have nerve damage in your legs and feet causing decreased feeling in them. You may not notice minor injuries to your feet that could lead to infections or more serious problems. Taking care of your feet is one of the most important things you can do for yourself.  HOME CARE INSTRUCTIONS  Wear shoes at all times, even in the house. Do not go barefoot. Bare feet are easily injured.  Check your feet daily for blisters, cuts, and redness. If you cannot see the bottom of your feet, use a mirror or ask someone for help.  Wash your feet with warm water (do not use hot water) and mild soap. Then pat your feet and the areas between your toes until they are completely dry. Do not soak your feet as this can dry your skin.  Apply a moisturizing lotion or petroleum jelly (that does not contain alcohol and is unscented) to the skin on your feet and to dry, brittle toenails. Do not apply lotion between your toes.  Trim your toenails straight across. Do not dig under them or around the cuticle. File the edges of your nails with an emery board or nail file.  Do not cut corns or calluses or try to remove them with medicine.  Wear clean socks or stockings every day. Make sure they are not too tight. Do not wear knee-high stockings since they may decrease blood flow to your legs.  Wear shoes that fit properly and have enough cushioning. To break in new shoes, wear them for just a few hours a day. This prevents you from injuring your feet. Always look in your shoes before you put them on to be sure there are no objects inside.  Do not cross your legs. This may decrease the blood flow to your feet.  If you find a minor scrape,  cut, or break in the skin on your feet, keep it and the skin around it clean and dry. These areas may be cleansed with mild soap and water. Do not cleanse the area with peroxide, alcohol, or iodine.  When you remove an adhesive bandage, be sure not to damage the skin around it.  If you have a wound, look at it several times a day to make sure it is healing.  Do not use heating pads or hot water bottles. They may burn your skin. If you have lost feeling in your feet or legs, you may not know it is happening until it is too late.  Make sure your health care provider performs a complete foot exam at least annually or more often if you have foot problems. Report any cuts, sores, or bruises to your health care provider immediately. SEEK MEDICAL CARE IF:   You have an injury that is not healing.  You have cuts or breaks in the skin.  You have an ingrown nail.  You notice redness on your legs or feet.  You feel burning or tingling in your legs or feet.  You have pain or cramps in your legs and feet.  Your legs or feet are numb.  Your feet always feel cold. SEEK IMMEDIATE MEDICAL CARE IF:   There is increasing redness,   swelling, or pain in or around a wound.  There is a red line that goes up your leg.  Pus is coming from a wound.  You develop a fever or as directed by your health care provider.  You notice a bad smell coming from an ulcer or wound.   This information is not intended to replace advice given to you by your health care provider. Make sure you discuss any questions you have with your health care provider.   Document Released: 09/03/2000 Document Revised: 05/09/2013 Document Reviewed: 02/13/2013 Elsevier Interactive Patient Education 2016 Elsevier Inc.  

## 2016-06-23 ENCOUNTER — Ambulatory Visit (HOSPITAL_COMMUNITY): Payer: PPO | Attending: Podiatry | Admitting: Physical Therapy

## 2016-06-23 ENCOUNTER — Encounter (HOSPITAL_COMMUNITY): Payer: Self-pay | Admitting: Physical Therapy

## 2016-06-23 DIAGNOSIS — R29898 Other symptoms and signs involving the musculoskeletal system: Secondary | ICD-10-CM | POA: Insufficient documentation

## 2016-06-23 DIAGNOSIS — R2689 Other abnormalities of gait and mobility: Secondary | ICD-10-CM | POA: Insufficient documentation

## 2016-06-23 DIAGNOSIS — M6281 Muscle weakness (generalized): Secondary | ICD-10-CM | POA: Diagnosis not present

## 2016-06-23 DIAGNOSIS — R2681 Unsteadiness on feet: Secondary | ICD-10-CM | POA: Diagnosis not present

## 2016-06-23 NOTE — Therapy (Signed)
[place], [place], [address] Phone: [phone]   Fax:  [phone]  Physical Therapy Evaluation  Patient Details  Name: [name] MRN: [id number] Date of Birth: [birth year] Referring Provider: Erline Hau, DPM  Encounter Date: 06/23/2016      PT End of Session - 06/23/16 1444    Visit Number 1   Number of Visits 17   Date for PT Re-Evaluation 72   Authorization Type [employer]    Authorization Time Period 06/23/16 to 08/20/16   PT Start Time 72   PT Stop Time 72   PT Time Calculation (min) 45 min   Activity Tolerance Patient tolerated treatment well   Behavior During Therapy [facility] for tasks assessed/performed      Past Medical History:  Diagnosis Date  . Arthritis   . Cardiomyopathy- EF NL 01/2013, now 30-35% echo 03/14/2012  . Carotid artery occlusion    left s/p CEA  . Diabetes mellitus   . Hypertension   . Hypothyroidism   . Obstructive sleep apnea   . Peripheral arterial disease ([place]), s/p PTA x 2 RLE    nonhealing ulcers bilaterally on each great toe  . PONV (postoperative nausea and vomiting)   . Restless leg syndrome   . Shortness of breath   . Stroke [facility]) March 08, 2012    Past Surgical History:  Procedure Laterality Date  . ANGIOPLASTY  02/28/14   diamond back orbital rotational atherectomy of Rt. tibial  . BACK SURGERY    . CARDIAC CATHETERIZATION N/A 05/19/2016   Procedure: Right/Left Heart Cath and Coronary Angiography;  Surgeon: Belva Crome, MD;  Location: [place] CV LAB;  Service: Cardiovascular;  Laterality: N/A;  . CERVICAL FUSION    . ENDARTERECTOMY Left 11/29/2013   Procedure: ENDARTERECTOMY CAROTID;  Surgeon: Serafina Mitchell, MD;  Location: [place];  Service: Vascular;  Laterality: Left;  . EYE SURGERY    . FOOT SURGERY    . Lower ext duplex doppler  03/14/14   Rt ABI 1.2  . LOWER EXTREMITY ANGIOGRAM Bilateral 02/18/2014   Procedure: LOWER EXTREMITY  ANGIOGRAM;  Surgeon: [name], MD;  Location: [facility] CATH LAB;  Service: Cardiovascular;  Laterality: Bilateral;  . LOWER EXTREMITY ANGIOGRAM N/A 10/31/2014   Procedure: LOWER EXTREMITY ANGIOGRAM;  Surgeon: [name], MD;  Location: [facility] C[facility] LAB;  Service: Cardiovascular;  Laterality: N/A;  . PV angiogram  02/18/2014   tibial vessel diseas bil.  [name] SPINE SURGERY    . tendon achillies lengthing and sesamoid      There were no vitals filed for this visit.       Subjective Assessment - 06/23/16 1305    Subjective Pt had achilles lengthening surgery on 04/09/16. He wore a hard cast for 72 weeks then wore a rigid boot for 72 more weeks. He just got rid of the boot 2 weeks ago. He has been having issues with his medications/BP but that was adjusted last week and he is having no issues since then. He is concerned with how he is walking.    Pertinent History Arthritis, HTN, PAD, DM, back surgery   Limitations Walking   How long can you sit comfortably? unlimited    How long can you stand comfortably? 30 minutes max    How long can you walk comfortably? 5 minutes and he's worn out    Patient Stated Goals improve his stamina and walking    Currently  in Pain? No/denies            [facility] PT Assessment - 06/23/16 0001      Assessment   Medical Diagnosis Bilateral LE weakness    Referring Provider Erline Hau, DPM   Onset Date/Surgical Date 04/09/16   Next MD Visit 72   Prior Therapy OPPT for wounds      Precautions   Precautions None     Balance Screen   Has the patient fallen in the past 6 months No  several close calls    Has the patient had a decrease in activity level because of a fear of falling?  No   Is the patient reluctant to leave their home because of a fear of falling?  No     Prior Function   Level of Independence Independent     Cognition   Overall Cognitive Status Within Functional Limits for tasks assessed     Sensation   Light Touch Appears Intact    Additional Comments Pt with decreased sensation from peripheral neuropathy in B feet      ROM / Strength   AROM / PROM / Strength Strength     Strength   Strength Assessment Site Hip;Knee;Ankle   Right/Left Hip Right;Left   Right Hip Flexion 5/5   Right Hip Extension 3/5   Right Hip ABduction 3+/5   Left Hip Flexion 5/5   Left Hip Extension 4/5   Left Hip ABduction 4-/5   Right/Left Knee Right;Left   Right Knee Flexion 5/5   Right Knee Extension 5/5   Left Knee Flexion 5/5   Left Knee Extension 5/5   Right/Left Ankle Right;Left   Right Ankle Dorsiflexion 5/5   Right Ankle Plantar Flexion 2/5  pain along Rt achilles tendon    Left Ankle Dorsiflexion 5/5   Left Ankle Plantar Flexion 2+/5     Flexibility   Soft Tissue Assessment /Muscle Length yes  Rt soleus: 20 deg, Gastroc: 12 deg DF   Hamstrings Rt: 60 deg, Lt: 55 deg, lacking    Piriformis 50% limited      Palpation   Palpation comment TTP Rt TFL/hip flexor     Transfers   Five time sit to stand comments  12.6 sec, no UE     Ambulation/Gait   Ambulation/Gait Yes   Ambulation/Gait Assistance 6: Modified independent (Device/Increase time)   Ambulation Distance (Feet) 50 Feet   Assistive device Straight cane   Gait Pattern Decreased step length - left;Decreased step length - right;Decreased stride length;Right flexed knee in stance;Left flexed knee in stance;Left foot flat;Right foot flat;Trendelenburg   Ambulation Surface Level     Standardized Balance Assessment   Standardized Balance Assessment Timed Up and Go Test     Timed Up and Go Test   TUG Comments 23.8 sec using [facility]                   [facility] Adult PT Treatment/Exercise - 06/23/16 0001      Exercises   Exercises Ankle;Knee/Hip     Knee/Hip Exercises: Supine   Bridges 10 reps     Ankle Exercises: Seated   Other Seated Ankle Exercises Rt PF with red TB x15 reps                 PT Education - 06/23/16 1442    Education  provided Yes   Education Details eval findings/POC; HEP; importance of addressing strength/flexibility issues to improve standing/sitting posture   Person(s)  Educated Patient   Methods Explanation;Handout;Verbal cues   Comprehension Verbalized understanding;Returned demonstration          PT Short Term Goals - 06/23/16 1452      PT SHORT TERM GOAL #1   Title Pt will demo consistency and independence with his HEP to improve balance and strength   Time 3   Period Weeks   Status New     PT SHORT TERM GOAL #2   Title Pt will demo improve ankle PF strength evideny by his ability to perform atleast 5 consecutive heel lifts on each LE, to improve his gait pattern.    Time 4   Period Weeks   Status New           PT Long Term Goals - 06/23/16 1454      PT LONG TERM GOAL #1   Title Pt will demo improved B hip strength to atleast 4+/5 MMT to increase his safety with functional tasks.    Time 8   Period Weeks   Status New     PT LONG TERM GOAL #2   Title Pt will perform SLS on each LE for up to 10 sec without LOB, 3/5 trials, to decrease risk of falling and injury.   Time 8   Period Weeks   Status New     PT LONG TERM GOAL #3   Title Pt will demo improved functional strength evident by his ability to perform 5x sit to stand in atleast 12 sec, without UE and crashing into the chair.    Time 8   Period Weeks   Status New     PT LONG TERM GOAL #4   Title Pt will perform TUG in less than 14 sec, with LRAD to represent a decreased is risk of falling in the community.   Time 8   Period Weeks   Status New     PT LONG TERM GOAL #5   Title Pt will demo improved hamstring flexibilty to lacking no more than 30 deg BLE, to improve his sitting and standing posture.    Time 8   Period Weeks   Status New     Additional Long Term Goals   Additional Long Term Goals Yes     PT LONG TERM GOAL #6   Title Pt will demo improved gait mechanics evident by his ability to ambulate atleast  100 ft with noted heel off and minimal knee flexion throughout stance, using LRAD.    Time 8   Period Weeks   Status New               Plan - 06/23/16 1445    Clinical Impression Statement Pt is a pleasant 70yo M referred to OPPT s/p Rt Achilles lengthening procedure, with poor trunk strength, BLE weakness and gait abnormality. He demonstrates B hip weakness (Rt>Lt) as well as limitations in LE flexibility and balance which is impairing his performance on functional testing such as 5x sit to stand and TUG. He ambulates with SPC and noted B knee flexion, trunk flexion and decreased toe off likely contributing to his calf weakness and hamstring tightness. He would benefit from skilled PT to address the listed impairments and improve his safety at home and in the community. I discussed eval findings and POC with pt who is in agreement with proposed frequency. HEP was initiated with occasional cues for proper technique.   Rehab Potential Good   PT Frequency 2x / week  PT Duration 8 weeks   PT Treatment/Interventions ADLs/Self Care Home Management;Gait training;Stair training;Functional mobility training;Therapeutic activities;Orthotic Fit/Training;Therapeutic exercise;Patient/family education;Neuromuscular re-education;Balance training;Manual techniques;Passive range of motion   PT Next Visit Plan review HEP; hamstring stretch, hip strength progression, ankle PF strengthening progression   PT Home Exercise Plan Ankle PF with red TB 2x15, bridge x20   Recommended Other Services none    Consulted and Agree with Plan of Care Patient      Patient will benefit from skilled therapeutic intervention in order to improve the following deficits and impairments:  Abnormal gait, Decreased activity tolerance, Decreased strength, Impaired flexibility, Pain, Postural dysfunction, Improper body mechanics, Difficulty walking, Decreased mobility, Decreased endurance, Decreased balance, Decreased range of  motion  Visit Diagnosis: Muscle weakness (generalized)  Unsteadiness on feet  Other symptoms and signs involving the musculoskeletal system  Other abnormalities of gait and mobility      G-Codes - 07-23-2016 1458    Functional Assessment Tool Used Clinical judgement based on functional performance, strength, ROM and balance    Functional Limitation Mobility: Walking and moving around   Mobility: Walking and Moving Around Current Status [id number]) At least 40 percent but less than 60 percent impaired, limited or restricted   Mobility: Walking and Moving Around Goal Status [id number]) At least 20 percent but less than 40 percent impaired, limited or restricted       Problem List Patient Active Problem List   Diagnosis Date Noted  . Arrhythmia 05/20/2016  . Angina decubitus ([place]) 05/19/2016  . Congestive heart failure ([place]) 05/17/2016  . Pain in the chest 05/17/2016  . Pulmonary nodule 05/17/2016  . CAP (community acquired pneumonia) 04/11/2016  . Elevated troponin 04/11/2016  . Nonhealing skin ulcer ([place]) 10/28/2014  . Bilateral carotid artery disease ([place]) 07/08/2014  . Critical lower limb ischemia 02/28/2014  . Obstructive sleep apnea 02/12/2014  . Aftercare following surgery of the circulatory system, [place] 12/04/2013  . Restless leg syndrome 10/23/2013  . Obesity (BMI 30.0-34.9) 10/23/2013  . Hyperlipidemia 07/16/2013  . Peripheral arterial disease ([place]) 07/16/2013  . History of stroke June 2013 03/14/2012  . Hypertension 03/14/2012  . Type 2 diabetes mellitus with circulatory disorder ([place]) 03/14/2012  . Peripheral neuropathy ([place]) 03/14/2012  . Cardiomyopathy- EF NL 01/2013, now 30-35% echo 03/14/2012  . ARTHRITIS, RIGHT FOOT 06/26/2008   3:05 PM,07/23/16 Elly Modena PT, DPT [facility] [place] [address] [place], [place], [address] Phone: [phone]   Fax:  [phone]  Name:  HERI PLUCKER MRN: [id number] Date of Birth: Aug 27, 1946

## 2016-06-24 DIAGNOSIS — E1140 Type 2 diabetes mellitus with diabetic neuropathy, unspecified: Secondary | ICD-10-CM | POA: Diagnosis not present

## 2016-06-24 DIAGNOSIS — E11628 Type 2 diabetes mellitus with other skin complications: Secondary | ICD-10-CM | POA: Diagnosis not present

## 2016-06-24 DIAGNOSIS — I739 Peripheral vascular disease, unspecified: Secondary | ICD-10-CM | POA: Diagnosis not present

## 2016-06-24 DIAGNOSIS — L84 Corns and callosities: Secondary | ICD-10-CM | POA: Diagnosis not present

## 2016-06-29 ENCOUNTER — Ambulatory Visit (HOSPITAL_COMMUNITY): Payer: PPO

## 2016-06-29 ENCOUNTER — Other Ambulatory Visit: Payer: Self-pay | Admitting: *Deleted

## 2016-06-29 ENCOUNTER — Encounter: Payer: Self-pay | Admitting: *Deleted

## 2016-06-29 DIAGNOSIS — M6281 Muscle weakness (generalized): Secondary | ICD-10-CM

## 2016-06-29 DIAGNOSIS — R29898 Other symptoms and signs involving the musculoskeletal system: Secondary | ICD-10-CM

## 2016-06-29 DIAGNOSIS — R2681 Unsteadiness on feet: Secondary | ICD-10-CM

## 2016-06-29 DIAGNOSIS — R2689 Other abnormalities of gait and mobility: Secondary | ICD-10-CM

## 2016-06-29 NOTE — Therapy (Signed)
[place], [place], [address] Phone: [phone]   Fax:  [phone]  Physical Therapy Treatment  Patient Details  Name: [name] MRN: [id number] Date of Birth: 1946/02/25 Referring Provider: Erline Hau, DPM  Encounter Date: 06/29/2016      PT End of Session - 06/29/16 0820    Visit Number 2   Number of Visits 17   Date for PT Re-Evaluation 07/21/16   Authorization Type [employer]    Authorization Time Period 06/23/16 to 08/20/16   PT Start Time 0814   PT Stop Time 0904   PT Time Calculation (min) 50 min   Activity Tolerance Patient tolerated treatment well   Behavior During Therapy [facility] for tasks assessed/performed      Past Medical History:  Diagnosis Date  . Arthritis   . Cardiomyopathy- EF NL 01/2013, now 30-35% echo 03/14/2012  . Carotid artery occlusion    left s/p CEA  . Diabetes mellitus   . Hypertension   . Hypothyroidism   . Obstructive sleep apnea   . Peripheral arterial disease ([place]), s/p PTA x 2 RLE    nonhealing ulcers bilaterally on each great toe  . PONV (postoperative nausea and vomiting)   . Restless leg syndrome   . Shortness of breath   . Stroke [facility]) March 08, 2012    Past Surgical History:  Procedure Laterality Date  . ANGIOPLASTY  02/28/14   diamond back orbital rotational atherectomy of Rt. tibial  . BACK SURGERY    . CARDIAC CATHETERIZATION N/A 05/19/2016   Procedure: Right/Left Heart Cath and Coronary Angiography;  Surgeon: Belva Crome, MD;  Location: [place] CV LAB;  Service: Cardiovascular;  Laterality: N/A;  . CERVICAL FUSION    . ENDARTERECTOMY Left 11/29/2013   Procedure: ENDARTERECTOMY CAROTID;  Surgeon: Serafina Mitchell, MD;  Location: [place];  Service: Vascular;  Laterality: Left;  . EYE SURGERY    . FOOT SURGERY    . Lower ext duplex doppler  03/14/14   Rt ABI 1.2  . LOWER EXTREMITY ANGIOGRAM Bilateral 02/18/2014   Procedure: LOWER EXTREMITY  ANGIOGRAM;  Surgeon: [name], MD;  Location: [facility] CATH LAB;  Service: Cardiovascular;  Laterality: Bilateral;  . LOWER EXTREMITY ANGIOGRAM N/A 10/31/2014   Procedure: LOWER EXTREMITY ANGIOGRAM;  Surgeon: [name], MD;  Location: [facility] CATH LAB;  Service: Cardiovascular;  Laterality: N/A;  . PV angiogram  02/18/2014   tibial vessel diseas bil.  [name] SPINE SURGERY    . tendon achillies lengthing and sesamoid      There were no vitals filed for this visit.      Subjective Assessment - 06/29/16 0818    Subjective Pt reports compliance with HEP and began walking program daioly.  No reports of pain or recent falls.     Pertinent History Arthritis, HTN, PAD, DM, back surgery   Patient Stated Goals improve his stamina and walking    Currently in Pain? No/denies                         [facility] Adult PT Treatment/Exercise - 06/29/16 0001      Knee/Hip Exercises: Stretches   Active Hamstring Stretch Both;3 reps;30 seconds   Active Hamstring Stretch Limitations supine with rope     Knee/Hip Exercises: Seated   Other Seated Knee/Hip Exercises Heel and toe raises 20x   Sit to Sand 10 reps;without UE support  Knee/Hip Exercises: Supine   Bridges 20 reps   Straight Leg Raises 15 reps     Knee/Hip Exercises: Sidelying   Hip ABduction 15 reps     Knee/Hip Exercises: Prone   Hip Extension 15 reps                PT Education - 06/29/16 0903    Education provided Yes   Education Details reviewed goals, compliance with HEP, copy of eval given to pt.   Person(s) Educated Patient   Methods Explanation;Demonstration;Handout   Comprehension Verbalized understanding;Returned demonstration;Verbal cues required          PT Short Term Goals - 06/23/16 1452      PT SHORT TERM GOAL #1   Title Pt will demo consistency and independence with his HEP to improve balance and strength   Time 3   Period Weeks   Status New     PT SHORT TERM GOAL #2   Title Pt will  demo improve ankle PF strength evideny by his ability to perform atleast 5 consecutive heel lifts on each LE, to improve his gait pattern.    Time 4   Period Weeks   Status New           PT Long Term Goals - 06/23/16 1454      PT LONG TERM GOAL #1   Title Pt will demo improved B hip strength to atleast 4+/5 MMT to increase his safety with functional tasks.    Time 8   Period Weeks   Status New     PT LONG TERM GOAL #2   Title Pt will perform SLS on each LE for up to 10 sec without LOB, 3/5 trials, to decrease risk of falling and injury.   Time 8   Period Weeks   Status New     PT LONG TERM GOAL #3   Title Pt will demo improved functional strength evident by his ability to perform 5x sit to stand in atleast 12 sec, without UE and crashing into the chair.    Time 8   Period Weeks   Status New     PT LONG TERM GOAL #4   Title Pt will perform TUG in less than 14 sec, with LRAD to represent a decreased is risk of falling in the community.   Time 8   Period Weeks   Status New     PT LONG TERM GOAL #5   Title Pt will demo improved hamstring flexibilty to lacking no more than 30 deg BLE, to improve his sitting and standing posture.    Time 8   Period Weeks   Status New     Additional Long Term Goals   Additional Long Term Goals Yes     PT LONG TERM GOAL #6   Title Pt will demo improved gait mechanics evident by his ability to ambulate atleast 100 ft with noted heel off and minimal knee flexion throughout stance, using LRAD.    Time 8   Period Weeks   Status New               Plan - 06/29/16 [id number]    Clinical Impression Statement Reviewed goals, complaince and assured correct form and technique with HEP and copy of eval given to pt.  Session focus on improving hip strengthening and mobility with therex exercises and stretches with therapist facilitation for correct form and technqiue as well as cueing to slow down movement for maximal strengthening  benefits.  No  reports of pain through session, was limited by fatigue with activity.     Rehab Potential Good   PT Frequency 2x / week   PT Duration 8 weeks   PT Treatment/Interventions ADLs/Self Care Home Management;Gait training;Stair training;Functional mobility training;Therapeutic activities;Orthotic Fit/Training;Therapeutic exercise;Patient/family education;Neuromuscular re-education;Balance training;Manual techniques;Passive range of motion   PT Next Visit Plan Continue with hamstring stretch, hip strength progression, ankle PF strengthening progression   PT Home Exercise Plan Eval: Ankle PF with red TB 2x15, bridge x20; 10/04: STS no HHA      Patient will benefit from skilled therapeutic intervention in order to improve the following deficits and impairments:  Abnormal gait, Decreased activity tolerance, Decreased strength, Impaired flexibility, Pain, Postural dysfunction, Improper body mechanics, Difficulty walking, Decreased mobility, Decreased endurance, Decreased balance, Decreased range of motion  Visit Diagnosis: Muscle weakness (generalized)  Unsteadiness on feet  Other symptoms and signs involving the musculoskeletal system  Other abnormalities of gait and mobility     Problem List Patient Active Problem List   Diagnosis Date Noted  . Arrhythmia 05/20/2016  . Angina decubitus ([place]) 05/19/2016  . Congestive heart failure ([place]) 05/17/2016  . Pain in the chest 05/17/2016  . Pulmonary nodule 05/17/2016  . CAP (community acquired pneumonia) 04/11/2016  . Elevated troponin 04/11/2016  . Nonhealing skin ulcer ([place]) 10/28/2014  . Bilateral carotid artery disease ([place]) 07/08/2014  . Critical lower limb ischemia 02/28/2014  . Obstructive sleep apnea 02/12/2014  . Aftercare following surgery of the circulatory system, [place] 12/04/2013  . Restless leg syndrome 10/23/2013  . Obesity (BMI 30.0-34.9) 10/23/2013  . Hyperlipidemia 07/16/2013  . Peripheral arterial disease ([place]) 07/16/2013   . History of stroke June 2013 03/14/2012  . Hypertension 03/14/2012  . Type 2 diabetes mellitus with circulatory disorder ([place]) 03/14/2012  . Peripheral neuropathy ([place]) 03/14/2012  . Cardiomyopathy- EF NL 01/2013, now 30-35% echo 03/14/2012  . ARTHRITIS, RIGHT FOOT 06/26/2008   [name], [place]; [place]  [name] 06/29/2016, 9:12 AM  [place], [place], [address] Phone: [phone]   Fax:  [phone]  Name: [name] MRN: [id number] Date of Birth: 29-Mar-1946

## 2016-06-29 NOTE — Patient Outreach (Signed)
[place] [facility]) Care Management  06/29/2016  [name] Oct 11, 1945 [id number]  Referral via [facility] Care Management:  Telephone call to patient; spouse answered call & advised that patient was unavailable to take call; she took contact information & will have patient return call.   Plan: Will follow up.  Sherrin Daisy, RN BSN [place] Management Coordinator [facility] Care Management  [phone]

## 2016-06-29 NOTE — Patient Instructions (Signed)
Functional Quadriceps: Sit to Stand    Sit on edge of chair, feet flat on floor. Stand upright, extending knees fully. Repeat 10 times per set. Do 1-2 sessions per day.  http://orth.exer.us/734   Copyright  VHI. All rights reserved.

## 2016-06-29 NOTE — Patient Outreach (Signed)
[place] [facility]) Care Management  06/29/2016  [name] [birth year] [id number]  Referral via [facility] Care Management:  Telephone call to patient who was advised of reason for call & of [facility] Management services. HIPPA verification received from patient.   Patient voices that he was recently discharged from [facility] 05/21/2016 following episode of heart failure. States he feels heart is under control now and that he is monitoring weight daily. States he has attended doctors appointments with primary care & cardiologist following hospital stay.    States he manages own medications & is taking as prescribed consistently. Voices that he has upcoming appointment with pharmacist 10/12 to go over his medications.   States he has support from spouse as needed. No problem with transportation.   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 Patient consents to referral to [place].  Patient states he is in the process of getting Advanced Directives in place. States he will accept packet -Advanced Directives.   Plan: Send Advance Directives packet Refer to care management assistant to assign [facility] for disease management .  Sherrin Daisy, RN BSN [place] Management Coordinator [facility] Care Management  [phone]

## 2016-06-30 ENCOUNTER — Ambulatory Visit: Payer: PPO | Admitting: *Deleted

## 2016-07-01 ENCOUNTER — Encounter: Payer: Self-pay | Admitting: Pharmacist

## 2016-07-01 ENCOUNTER — Ambulatory Visit (INDEPENDENT_AMBULATORY_CARE_PROVIDER_SITE_OTHER): Payer: PPO | Admitting: Pharmacist

## 2016-07-01 VITALS — BP 158/52 | HR 81

## 2016-07-01 DIAGNOSIS — I10 Essential (primary) hypertension: Secondary | ICD-10-CM | POA: Diagnosis not present

## 2016-07-01 LAB — BASIC METABOLIC PANEL
BUN: 16 mg/dL (ref 7–25)
CO2: 26 mmol/L (ref 20–31)
Calcium: 8.8 mg/dL (ref 8.6–10.3)
Chloride: 104 mmol/L (ref 98–110)
Creat: 1 mg/dL (ref 0.70–1.18)
Glucose, Bld: 110 mg/dL — ABNORMAL HIGH (ref 65–99)
Potassium: 3.8 mmol/L (ref 3.5–5.3)
Sodium: 140 mmol/L (ref 135–146)

## 2016-07-01 NOTE — Progress Notes (Signed)
Patient ID: [name]                 DOB: 03-Oct-1945                      MRN: [id number]     HPI: [name] is a 70 y.o. male patient of Dr. Gwenlyn Found with [facility] who presents today for follow up of orthostatic hypotension. He was recently seen by Rosaria Ferries, PA at which time his pressure was positive for orthostatics.   He reports that he as "fallen out" several times in public places. He reports that this is less common on lower dose of lasix, but that he still experiences orthostatic symptoms frequently. He states this is most common in the morning. He states that his pressure when he awakens is 154/87 then drops to 109/62 and he feels extremely lightheaded.   He states he was previously on florinef for low blood pressures and this worked well. He is unsure exactly when or why this was stopped. He is requesting to have this medication today.   He reports that he has not yet taken his morning medications today.    Cardiac Hx: HTN, DM, HLD, OSA, CVA, CHF with EF 30-35%  Current HTN meds:  Carvedilol 3.125mg  BID Furosemide 40mg  daily  Previously tried:   Family History: He reports that his mother had hypertension.   Social History: He smoked a pipe in college but has remain off tobacco products since then. He denies alcohol.   Diet: He states he is somewhat frustrated over trying to find food that meet his strict sodium needs. He has spent hours looking for snacks that are low in sodium. He reports that just has made his own snacks with unsalted nuts, sunflower seeds, and dried cranberries. He has also started using more garlic and onion in place of salt. He love salsa and has found a good brand that is relatively low in sodium.   Exercise: He is in rehab after foot surgery. They have him walk for 15 minutes each day. He believes he may have over done it yesterday as he walked for nearly 2 hours in [place].   Home BP readings: see above  Wt Readings from Last 3  Encounters:  06/16/16 236 lb (107 kg)  05/26/16 236 lb (107 kg)  05/21/16 223 lb 8 oz (101.4 kg)   BP Readings from Last 3 Encounters:  07/01/16 (!) 158/52  06/16/16 105/63  05/26/16 (!) 92/55   Pulse Readings from Last 3 Encounters:  07/01/16 81  06/16/16 82  05/26/16 70    Renal function: CrCl cannot be calculated (Unknown ideal weight.).  Past Medical History:  Diagnosis Date  . Arthritis   . Cardiomyopathy- EF NL 01/2013, now 30-35% echo 03/14/2012  . Carotid artery occlusion    left s/p CEA  . Diabetes mellitus   . Hypertension   . Hypothyroidism   . Obstructive sleep apnea   . Peripheral arterial disease ([place]), s/p PTA x 2 RLE    nonhealing ulcers bilaterally on each great toe  . PONV (postoperative nausea and vomiting)   . Restless leg syndrome   . Shortness of breath   . Stroke [facility]) March 08, 2012    Current Outpatient Prescriptions on File Prior to Visit  Medication Sig Dispense Refill  . aspirin EC 81 MG EC tablet Take 1 tablet (81 mg total) by mouth daily.    [name] b complex vitamins tablet  Take 1 tablet by mouth daily.    . carvedilol (COREG) 3.125 MG tablet Take 1 tablet (3.125 mg total) by mouth 2 (two) times daily with a meal. 60 tablet 11  . clopidogrel (PLAVIX) 75 MG tablet Take 75 mg by mouth daily.     . furosemide (LASIX) 40 MG tablet Take 1 tablet (40 mg total) by mouth 2 (two) times daily. (Patient taking differently: Take 40 mg by mouth daily. ) 60 tablet 3  . gabapentin (NEURONTIN) 600 MG tablet Take 600 mg by mouth 4 (four) times daily.     [name] glimepiride (AMARYL) 4 MG tablet Take 4 mg by mouth daily with breakfast.    . levothyroxine (SYNTHROID, LEVOTHROID) 25 MCG tablet Take 50 mcg by mouth daily.     . nitroGLYCERIN (NITROSTAT) 0.4 MG SL tablet Place 1 tablet (0.4 mg total) under the tongue every 5 (five) minutes as needed for chest pain. 25 tablet 3  . Omega 3-6-9 Fatty Acids (OMEGA 3-6-9 COMPLEX) CAPS Take 1 capsule by mouth 2 (two) times daily.     . potassium chloride SA (K-DUR,KLOR-CON) 20 MEQ tablet Take 1 tablet (20 mEq total) by mouth daily. 30 tablet 11  . pravastatin (PRAVACHOL) 40 MG tablet Take 40 mg by mouth every morning.     [name] rOPINIRole (REQUIP) 0.5 MG tablet Take 1 tablet (0.5 mg total) by mouth at bedtime. (Patient taking differently: Take 0.5 mg by mouth 2 (two) times daily. ) 30 tablet 11  . Thiamine Mononitrate (VITAMIN B1 PO) Take 1,000 mg by mouth 2 (two) times daily.     No current facility-administered medications on file prior to visit.     Allergies  Allergen Reactions  . Codeine Nausea And Vomiting  . Tramadol Nausea Only    Blood pressure (!) 158/52, pulse 81, SpO2 95 %.   Assessment/Plan: Hypertension: BP is above goal today; however he has not yet taken his medications and given that he has passed out several times from low blood pressures will not change BP meds today. Since the patient is positive for orthostatic hypotension and has had several episodes that have caused him to pass out, would prefer he at least have a PRN to help with low pressures. Would avoid Florinef due to CHF. Will give him low dose midodrine 5mg  PRN low pressure. Have asked he record pressures and times for which he uses midodrine and return to clinic with log in 2 weeks.    Thank you, Lelan Pons. Patterson Hammersmith, [facility]  07/01/2016 12:12 PM

## 2016-07-01 NOTE — Patient Instructions (Addendum)
BLOOD WORK today  Return for a follow up appointment in 2 weeks  Check your blood pressure at home daily (if able) and keep record of the readings.  Take your BP meds as follows: TAKE midodrine 5mg  as needed for symptomatic low blood pressure.   Bring all of your meds, your BP cuff and your record of home blood pressures to your next appointment.  Exercise as you're able, try to walk approximately 30 minutes per day.  Keep salt intake to a minimum, especially watch canned and prepared boxed foods.  Eat more fresh fruits and vegetables and fewer canned items.  Avoid eating in fast food restaurants.    HOW TO TAKE YOUR BLOOD PRESSURE: . Rest 5 minutes before taking your blood pressure. .  Don't smoke or drink caffeinated beverages for at least 30 minutes before. . Take your blood pressure before (not after) you eat. . Sit comfortably with your back supported and both feet on the floor (don't cross your legs). . Elevate your arm to heart level on a table or a desk. . Use the proper sized cuff. It should fit smoothly and snugly around your bare upper arm. There should be enough room to slip a fingertip under the cuff. The bottom edge of the cuff should be 1 inch above the crease of the elbow. . Ideally, take 3 measurements at one sitting and record the average.

## 2016-07-02 ENCOUNTER — Ambulatory Visit (HOSPITAL_COMMUNITY): Payer: PPO | Admitting: Physical Therapy

## 2016-07-02 ENCOUNTER — Telehealth: Payer: Self-pay | Admitting: Pharmacist

## 2016-07-02 DIAGNOSIS — R29898 Other symptoms and signs involving the musculoskeletal system: Secondary | ICD-10-CM

## 2016-07-02 DIAGNOSIS — R2681 Unsteadiness on feet: Secondary | ICD-10-CM

## 2016-07-02 DIAGNOSIS — R2689 Other abnormalities of gait and mobility: Secondary | ICD-10-CM

## 2016-07-02 DIAGNOSIS — M6281 Muscle weakness (generalized): Secondary | ICD-10-CM

## 2016-07-02 MED ORDER — MIDODRINE HCL 5 MG PO TABS: 5.0000 mg | ORAL_TABLET | Freq: Every day | ORAL | 1 refills | Status: DC | PRN

## 2016-07-02 NOTE — Therapy (Signed)
[place], [place], [address] Phone: [phone]   Fax:  [phone]  Physical Therapy Treatment  Patient Details  Name: [name] MRN: [id number] Date of Birth: 19-Feb-1946 Referring Provider: Erline Hau, DPM  Encounter Date: 07/02/2016      PT End of Session - 07/02/16 1430    Visit Number 3   Number of Visits 17   Date for PT Re-Evaluation 07/21/16   Authorization Type [employer]    Authorization Time Period 06/23/16 to 08/20/16   PT Start Time 1345   PT Stop Time 1430   PT Time Calculation (min) 45 min   Activity Tolerance Patient tolerated treatment well   Behavior During Therapy [facility] for tasks assessed/performed      Past Medical History:  Diagnosis Date  . Arthritis   . Cardiomyopathy- EF NL 01/2013, now 30-35% echo 03/14/2012  . Carotid artery occlusion    left s/p CEA  . Diabetes mellitus   . Hypertension   . Hypothyroidism   . Obstructive sleep apnea   . Peripheral arterial disease ([place]), s/p PTA x 2 RLE    nonhealing ulcers bilaterally on each great toe  . PONV (postoperative nausea and vomiting)   . Restless leg syndrome   . Shortness of breath   . Stroke [facility]) March 08, 2012    Past Surgical History:  Procedure Laterality Date  . ANGIOPLASTY  02/28/14   diamond back orbital rotational atherectomy of Rt. tibial  . BACK SURGERY    . CARDIAC CATHETERIZATION N/A 05/19/2016   Procedure: Right/Left Heart Cath and Coronary Angiography;  Surgeon: Belva Crome, MD;  Location: [facility];  Service: Cardiovascular;  Laterality: N/A;  . CERVICAL FUSION    . ENDARTERECTOMY Left 11/29/2013   Procedure: ENDARTERECTOMY CAROTID;  Surgeon: Serafina Mitchell, MD;  Location: [facility];  Service: Vascular;  Laterality: Left;  . EYE SURGERY    . FOOT SURGERY    . Lower ext duplex doppler  03/14/14   Rt ABI 1.2  . LOWER EXTREMITY ANGIOGRAM Bilateral 02/18/2014   Procedure: LOWER EXTREMITY  ANGIOGRAM;  Surgeon: [name], MD;  Location: [facility] CATH LAB;  Service: Cardiovascular;  Laterality: Bilateral;  . LOWER EXTREMITY ANGIOGRAM N/A 10/31/2014   Procedure: LOWER EXTREMITY ANGIOGRAM;  Surgeon: [name], MD;  Location: [facility] CATH LAB;  Service: Cardiovascular;  Laterality: N/A;  . PV angiogram  02/18/2014   tibial vessel diseas bil.  [name] SPINE SURGERY    . tendon achillies lengthing and sesamoid      There were no vitals filed for this visit.      Subjective Assessment - 07/02/16 1339    Subjective  Pt states that his pain is more of stretching pain.  He has been doing his exercises at home.     Pertinent History Arthritis, HTN, PAD, DM, back surgery   Limitations Walking   How long can you sit comfortably? unlimited    How long can you stand comfortably? 30 minutes max    How long can you walk comfortably? 5 minutes and he's worn out    Patient Stated Goals improve his stamina and walking                          [facility] Adult PT Treatment/Exercise - 07/02/16 0001      Knee/Hip Exercises: Stretches   Active Hamstring Stretch Both;2 reps;60 seconds  Passive Hamstring Stretch Right;Left;3 reps;30 seconds     Knee/Hip Exercises: Standing   Heel Raises Limitations toe raises x 10    Lateral Step Up Right;10 reps;Hand Hold: 1;Step Height: 4"   Forward Step Up Both;10 reps;Step Height: 4"   Functional Squat 10 reps   Functional Squat Limitations mini squat    Other Standing Knee Exercises side step with green t-band x 1 RT    Other Standing Knee Exercises tandem stance x3 bilarerally     Knee/Hip Exercises: Seated   Sit to Sand 10 reps;without UE support                  PT Short Term Goals - 07/02/16 1431      PT SHORT TERM GOAL #1   Title Pt will demo consistency and independence with his HEP to improve balance and strength   Time 3   Period Weeks   Status On-going     PT SHORT TERM GOAL #2   Title Pt will demo improve ankle  PF strength evideny by his ability to perform atleast 5 consecutive heel lifts on each LE, to improve his gait pattern.    Time 4   Period Weeks   Status On-going           PT Long Term Goals - 07/02/16 1432      PT LONG TERM GOAL #1   Title Pt will demo improved B hip strength to atleast 4+/5 MMT to increase his safety with functional tasks.    Time 8   Period Weeks   Status On-going     PT LONG TERM GOAL #2   Title Pt will perform SLS on each LE for up to 10 sec without LOB, 3/5 trials, to decrease risk of falling and injury.   Time 8   Period Weeks   Status On-going     PT LONG TERM GOAL #3   Title Pt will demo improved functional strength evident by his ability to perform 5x sit to stand in atleast 12 sec, without UE and crashing into the chair.    Time 8   Period Weeks   Status On-going     PT LONG TERM GOAL #4   Title Pt will perform TUG in less than 14 sec, with LRAD to represent a decreased is risk of falling in the community.   Time 8   Period Weeks   Status On-going     PT LONG TERM GOAL #5   Title Pt will demo improved hamstring flexibilty to lacking no more than 30 deg BLE, to improve his sitting and standing posture.    Time 8   Period Weeks   Status On-going     PT LONG TERM GOAL #6   Title Pt will demo improved gait mechanics evident by his ability to ambulate atleast 100 ft with noted heel off and minimal knee flexion throughout stance, using LRAD.    Time 8   Period Weeks   Status New               Plan - 07/02/16 1430    Clinical Impression Statement Todays treatment focused on stretching, strengthening and balance with all exercises needing min guard assist and verbal cuing for proper technique and safety.    Rehab Potential Good   PT Frequency 2x / week   PT Duration 8 weeks   PT Treatment/Interventions ADLs/Self Care Home Management;Gait training;Stair training;Functional mobility training;Therapeutic activities;Orthotic  Fit/Training;Therapeutic exercise;Patient/family education;Neuromuscular  re-education;Balance training;Manual techniques;Passive range of motion   PT Next Visit Plan Continue with hamstring stretch, hip strength progression, ankle PF strengthening progression   PT Home Exercise Plan Eval: Ankle PF with red TB 2x15, bridge x20; 10/04: STS no HHA      Patient will benefit from skilled therapeutic intervention in order to improve the following deficits and impairments:  Abnormal gait, Decreased activity tolerance, Decreased strength, Impaired flexibility, Pain, Postural dysfunction, Improper body mechanics, Difficulty walking, Decreased mobility, Decreased endurance, Decreased balance, Decreased range of motion  Visit Diagnosis: Muscle weakness (generalized)  Unsteadiness on feet  Other symptoms and signs involving the musculoskeletal system  Other abnormalities of gait and mobility     Problem List Patient Active Problem List   Diagnosis Date Noted  . Arrhythmia 05/20/2016  . Angina decubitus ([place]) 05/19/2016  . Congestive heart failure ([place]) 05/17/2016  . Pain in the chest 05/17/2016  . Pulmonary nodule 05/17/2016  . CAP (community acquired pneumonia) 04/11/2016  . Elevated troponin 04/11/2016  . Nonhealing skin ulcer ([place]) 10/28/2014  . Bilateral carotid artery disease ([place]) 07/08/2014  . Critical lower limb ischemia 02/28/2014  . Obstructive sleep apnea 02/12/2014  . Aftercare following surgery of the circulatory system, [place] 12/04/2013  . Restless leg syndrome 10/23/2013  . Obesity (BMI 30.0-34.9) 10/23/2013  . Hyperlipidemia 07/16/2013  . Peripheral arterial disease ([place]) 07/16/2013  . History of stroke June 2013 03/14/2012  . Hypertension 03/14/2012  . Type 2 diabetes mellitus with circulatory disorder ([place]) 03/14/2012  . Peripheral neuropathy ([place]) 03/14/2012  . Cardiomyopathy- EF NL 01/2013, now 30-35% echo 03/14/2012  . ARTHRITIS, RIGHT FOOT 06/26/2008   Rayetta Humphrey, PT CLT [phone] 07/02/2016, 2:33 PM  [facility] [address] [place], [place], [address] Phone: [phone]   Fax:  [phone]  Name: [name] MRN: [id number] Date of Birth: [birth year]

## 2016-07-02 NOTE — Patient Instructions (Addendum)
Dorsiflexion: Resisted    Facing anchor, tubing around right foot, pull toward face.  Repeat _10___ times per set. Do _2__ sets per session. Do __1__ sessions per day.  http://orth.exer.us/8   Copyright  VHI. All rights reserved.  Bridging    Slowly raise buttocks from floor, keeping stomach tight. Repeat __20__ times per set. Do __1__ sets per session. Do __2__ sessions per day.  http://orth.exer.us/1096   Copyright  VHI. All rights reserved.  Functional Quadriceps: Sit to Stand    Sit on edge of chair, feet flat on floor. Stand upright, extending knees fully. Repeat _10__ times per set. Do ___1_ sets per session. Do __2__ sessions per day.  http://orth.exer.us/734   Copyright  VHI. All rights reserved.  Standing: Unilateral    Stand, one heel on stool, leg straight, standing leg slightly bent. Slowly lean forward, keeping back straight. Hold 60___ seconds. Repeat __2_ times per session. Do _1__ sessions per day.  Copyright  VHI. All rights reserved.  Toe Raise (Standing)    Rock back on heels. Repeat _10___ times per set. Do __1__ sets per session. Do __2__ sessions per day.  http://orth.exer.us/42   Copyright  VHI. All rights reserved.  Step-Down / Step-Up    Stand on stair step or __4-6__ inch stool. Slowly bend left leg, lowering other foot to floor. Return by straightening front leg. Repeat _10___ times per set. Do ___1_ sets per session. Do __2__ sessions per day.  http://orth.exer.us/684   Copyright  VHI. All rights reserved.

## 2016-07-02 NOTE — Telephone Encounter (Signed)
New message       Pt states that he is supposed to have a presc for midodrine called in to the pharmacy.  Do you know anything about this?  He went to the pharmacy and it was not there?

## 2016-07-05 NOTE — Telephone Encounter (Signed)
Rx has been sent  

## 2016-07-06 ENCOUNTER — Telehealth (HOSPITAL_COMMUNITY): Payer: Self-pay

## 2016-07-06 ENCOUNTER — Ambulatory Visit (HOSPITAL_COMMUNITY): Payer: PPO

## 2016-07-06 DIAGNOSIS — M6281 Muscle weakness (generalized): Secondary | ICD-10-CM | POA: Diagnosis not present

## 2016-07-06 DIAGNOSIS — R2681 Unsteadiness on feet: Secondary | ICD-10-CM

## 2016-07-06 DIAGNOSIS — R2689 Other abnormalities of gait and mobility: Secondary | ICD-10-CM

## 2016-07-06 DIAGNOSIS — R29898 Other symptoms and signs involving the musculoskeletal system: Secondary | ICD-10-CM

## 2016-07-06 NOTE — Telephone Encounter (Signed)
No show, called home and spoke to wife who stated he was at therapy.  Called mobile phone and pt. had apt. time incorrect.  Session was rescheduled for different time.    [address], [place]; CBIS [phone]

## 2016-07-06 NOTE — Therapy (Signed)
[place], [place], [address] Phone: [phone]   Fax:  [phone]  Physical Therapy Treatment  Patient Details  Name: [name] MRN: [id number] Date of Birth: [birth year] Referring Provider: Erline Hau, DPM  Encounter Date: 07/06/2016      PT End of Session - 07/06/16 1453    Visit Number 4   Number of Visits 17   Date for PT Re-Evaluation 07/21/16   Authorization Type [employer]    Authorization Time Period 06/23/16 to 08/20/16   PT Start Time 1450   PT Stop Time 1529   PT Time Calculation (min) 39 min   Equipment Utilized During Treatment Gait belt   Activity Tolerance Patient tolerated treatment well   Behavior During Therapy [facility] for tasks assessed/performed      Past Medical History:  Diagnosis Date  . Arthritis   . Cardiomyopathy- EF NL 01/2013, now 30-35% echo 03/14/2012  . Carotid artery occlusion    left s/p CEA  . Diabetes mellitus   . Hypertension   . Hypothyroidism   . Obstructive sleep apnea   . Peripheral arterial disease ([place]), s/p PTA x 2 RLE    nonhealing ulcers bilaterally on each great toe  . PONV (postoperative nausea and vomiting)   . Restless leg syndrome   . Shortness of breath   . Stroke [facility]) March 08, 2012    Past Surgical History:  Procedure Laterality Date  . ANGIOPLASTY  02/28/14   diamond back orbital rotational atherectomy of Rt. tibial  . BACK SURGERY    . CARDIAC CATHETERIZATION N/A 05/19/2016   Procedure: Right/Left Heart Cath and Coronary Angiography;  Surgeon: Belva Crome, MD;  Location: [place] CV LAB;  Service: Cardiovascular;  Laterality: N/A;  . CERVICAL FUSION    . ENDARTERECTOMY Left 11/29/2013   Procedure: ENDARTERECTOMY CAROTID;  Surgeon: Serafina Mitchell, MD;  Location: [place];  Service: Vascular;  Laterality: Left;  . EYE SURGERY    . FOOT SURGERY    . Lower ext duplex doppler  03/14/14   Rt ABI 1.2  . LOWER EXTREMITY ANGIOGRAM  Bilateral 02/18/2014   Procedure: LOWER EXTREMITY ANGIOGRAM;  Surgeon: [name], MD;  Location: [facility] CATH LAB;  Service: Cardiovascular;  Laterality: Bilateral;  . LOWER EXTREMITY ANGIOGRAM N/A 10/31/2014   Procedure: LOWER EXTREMITY ANGIOGRAM;  Surgeon: [name], MD;  Location: [facility] CATH LAB;  Service: Cardiovascular;  Laterality: N/A;  . PV angiogram  02/18/2014   tibial vessel diseas bil.  [name] SPINE SURGERY    . tendon achillies lengthing and sesamoid      There were no vitals filed for this visit.      Subjective Assessment - 07/06/16 1452    Subjective Pt stated he has pain anterior thigh, pain scale 2/10.  Went walking in [place] yesterday for 20 minutes.  Continues to be compliant with HEP.     Pertinent History Arthritis, HTN, PAD, DM, back surgery   Patient Stated Goals improve his stamina and walking    Currently in Pain? No/denies             [facility] Adult PT Treatment/Exercise - 07/06/16 0001      Knee/Hip Exercises: Stretches   Active Hamstring Stretch 3 reps;30 seconds   Active Hamstring Stretch Limitations supine with rope     Knee/Hip Exercises: Standing   Heel Raises 10 reps;2 sets   Heel Raises Limitations toe raises x 10  Lateral Step Up Hand Hold: 1;Step Height: 4";Both;15 reps   Forward Step Up Both;15 reps;Hand Hold: 1;Step Height: 4"   Functional Squat 15 reps   Functional Squat Limitations mini squat    Other Standing Knee Exercises side step with green t-band x 1 RT    Other Standing Knee Exercises tandem stance x3 bilarerally     Knee/Hip Exercises: Seated   Sit to Sand 2 sets;10 reps;without UE support             PT Short Term Goals - 07/02/16 1431      PT SHORT TERM GOAL #1   Title Pt will demo consistency and independence with his HEP to improve balance and strength   Time 3   Period Weeks   Status On-going     PT SHORT TERM GOAL #2   Title Pt will demo improve ankle PF strength evideny by his ability to perform atleast 5  consecutive heel lifts on each LE, to improve his gait pattern.    Time 4   Period Weeks   Status On-going           PT Long Term Goals - 07/02/16 1432      PT LONG TERM GOAL #1   Title Pt will demo improved B hip strength to atleast 4+/5 MMT to increase his safety with functional tasks.    Time 8   Period Weeks   Status On-going     PT LONG TERM GOAL #2   Title Pt will perform SLS on each LE for up to 10 sec without LOB, 3/5 trials, to decrease risk of falling and injury.   Time 8   Period Weeks   Status On-going     PT LONG TERM GOAL #3   Title Pt will demo improved functional strength evident by his ability to perform 5x sit to stand in atleast 12 sec, without UE and crashing into the chair.    Time 8   Period Weeks   Status On-going     PT LONG TERM GOAL #4   Title Pt will perform TUG in less than 14 sec, with LRAD to represent a decreased is risk of falling in the community.   Time 8   Period Weeks   Status On-going     PT LONG TERM GOAL #5   Title Pt will demo improved hamstring flexibilty to lacking no more than 30 deg BLE, to improve his sitting and standing posture.    Time 8   Period Weeks   Status On-going     PT LONG TERM GOAL #6   Title Pt will demo improved gait mechanics evident by his ability to ambulate atleast 100 ft with noted heel off and minimal knee flexion throughout stance, using LRAD.    Time 8   Period Weeks   Status New               Plan - 07/06/16 1546    Clinical Impression Statement Session foucs on LE strengthening, stretching and balance with min guard A for safety and verbal cueing for proper technique and posture through session.  Increased reps to improve activity tolerance and functional strengthening. Pt limited by fatigue at EOS, no reports of pain.     Rehab Potential Good   PT Frequency 2x / week   PT Duration 8 weeks   PT Treatment/Interventions ADLs/Self Care Home Management;Gait training;Stair  training;Functional mobility training;Therapeutic activities;Orthotic Fit/Training;Therapeutic exercise;Patient/family education;Neuromuscular re-education;Balance training;Manual techniques;Passive range of  motion   PT Next Visit Plan Continue with hamstring stretch, hip strength progression, ankle PF strengthening progression   PT Home Exercise Plan Eval: Ankle PF with red TB 2x15, bridge x20; 10/04: STS no HHA      Patient will benefit from skilled therapeutic intervention in order to improve the following deficits and impairments:  Abnormal gait, Decreased activity tolerance, Decreased strength, Impaired flexibility, Pain, Postural dysfunction, Improper body mechanics, Difficulty walking, Decreased mobility, Decreased endurance, Decreased balance, Decreased range of motion  Visit Diagnosis: Muscle weakness (generalized)  Unsteadiness on feet  Other symptoms and signs involving the musculoskeletal system  Other abnormalities of gait and mobility     Problem List Patient Active Problem List   Diagnosis Date Noted  . Arrhythmia 05/20/2016  . Angina decubitus ([place]) 05/19/2016  . Congestive heart failure ([place]) 05/17/2016  . Pain in the chest 05/17/2016  . Pulmonary nodule 05/17/2016  . CAP (community acquired pneumonia) 04/11/2016  . Elevated troponin 04/11/2016  . Nonhealing skin ulcer ([place]) 10/28/2014  . Bilateral carotid artery disease ([place]) 07/08/2014  . Critical lower limb ischemia 02/28/2014  . Obstructive sleep apnea 02/12/2014  . Aftercare following surgery of the circulatory system, [place] 12/04/2013  . Restless leg syndrome 10/23/2013  . Obesity (BMI 30.0-34.9) 10/23/2013  . Hyperlipidemia 07/16/2013  . Peripheral arterial disease ([place]) 07/16/2013  . History of stroke June 2013 03/14/2012  . Hypertension 03/14/2012  . Type 2 diabetes mellitus with circulatory disorder ([place]) 03/14/2012  . Peripheral neuropathy ([place]) 03/14/2012  . Cardiomyopathy- EF NL 01/2013, now  30-35% echo 03/14/2012  . ARTHRITIS, RIGHT FOOT 06/26/2008   [name], [place]; [place]  [name] 07/06/2016, 6:48 PM  [place], [place], [address] Phone: [phone]   Fax:  [phone]  Name: [name] MRN: [id number] Date of Birth: 11-Nov-1945

## 2016-07-07 ENCOUNTER — Other Ambulatory Visit: Payer: Self-pay

## 2016-07-07 NOTE — Patient Outreach (Signed)
[name]) Care Management  07/07/2016  [name] 24-Oct-1945 [id number]   Telephone call to patient for initial assessment.  No answer.  HIPAA compliant voice message left.   Plan: RN Health Coach will attempt patient again in the month of October.  Christopher Baseman, RN, MSN [name] [phone]

## 2016-07-09 ENCOUNTER — Ambulatory Visit (HOSPITAL_COMMUNITY): Payer: PPO

## 2016-07-09 DIAGNOSIS — R2681 Unsteadiness on feet: Secondary | ICD-10-CM

## 2016-07-09 DIAGNOSIS — M6281 Muscle weakness (generalized): Secondary | ICD-10-CM

## 2016-07-09 DIAGNOSIS — R2689 Other abnormalities of gait and mobility: Secondary | ICD-10-CM

## 2016-07-09 DIAGNOSIS — R29898 Other symptoms and signs involving the musculoskeletal system: Secondary | ICD-10-CM

## 2016-07-09 NOTE — Therapy (Signed)
[place], [place], [address] Phone: [phone]   Fax:  [phone]  Physical Therapy Treatment  Patient Details  Name: [name] MRN: [id number] Date of Birth: Feb 07, 1946 Referring Provider: Erline Hau, DPM  Encounter Date: 07/09/2016      PT End of Session - 07/09/16 0948    Visit Number 5   Number of Visits 17   Date for PT Re-Evaluation 07/21/16   Authorization Type [employer]    Authorization Time Period 06/23/16 to 08/20/16   PT Start Time 0907   PT Stop Time 0948   PT Time Calculation (min) 41 min   Equipment Utilized During Treatment Gait belt   Activity Tolerance Patient tolerated treatment well   Behavior During Therapy [facility] for tasks assessed/performed      Past Medical History:  Diagnosis Date  . Arthritis   . Cardiomyopathy- EF NL 01/2013, now 30-35% echo 03/14/2012  . Carotid artery occlusion    left s/p CEA  . Diabetes mellitus   . Hypertension   . Hypothyroidism   . Obstructive sleep apnea   . Peripheral arterial disease ([place]), s/p PTA x 2 RLE    nonhealing ulcers bilaterally on each great toe  . PONV (postoperative nausea and vomiting)   . Restless leg syndrome   . Shortness of breath   . Stroke [facility]) March 08, 2012    Past Surgical History:  Procedure Laterality Date  . ANGIOPLASTY  02/28/14   diamond back orbital rotational atherectomy of Rt. tibial  . BACK SURGERY    . CARDIAC CATHETERIZATION N/A 05/19/2016   Procedure: Right/Left Heart Cath and Coronary Angiography;  Surgeon: Belva Crome, MD;  Location: [place] CV LAB;  Service: Cardiovascular;  Laterality: N/A;  . CERVICAL FUSION    . ENDARTERECTOMY Left 11/29/2013   Procedure: ENDARTERECTOMY CAROTID;  Surgeon: Serafina Mitchell, MD;  Location: [place];  Service: Vascular;  Laterality: Left;  . EYE SURGERY    . FOOT SURGERY    . Lower ext duplex doppler  03/14/14   Rt ABI 1.2  . LOWER EXTREMITY ANGIOGRAM  Bilateral 02/18/2014   Procedure: LOWER EXTREMITY ANGIOGRAM;  Surgeon: [name], MD;  Location: [facility] CATH LAB;  Service: Cardiovascular;  Laterality: Bilateral;  . LOWER EXTREMITY ANGIOGRAM N/A 10/31/2014   Procedure: LOWER EXTREMITY ANGIOGRAM;  Surgeon: [name], MD;  Location: [facility] CATH LAB;  Service: Cardiovascular;  Laterality: N/A;  . PV angiogram  02/18/2014   tibial vessel diseas bil.  [name] SPINE SURGERY    . tendon achillies lengthing and sesamoid      There were no vitals filed for this visit.      Subjective Assessment - 07/09/16 0907    Subjective Pt stated he forgot his SPC in his Rt hamstring is very tight and sore today.  No reports of recent falls   Pertinent History Arthritis, HTN, PAD, DM, back surgery   Patient Stated Goals               [facility] Adult PT Treatment/Exercise - 07/09/16 0001      Knee/Hip Exercises: Stretches   Active Hamstring Stretch 3 reps;30 seconds   Active Hamstring Stretch Limitations supine with rope     Knee/Hip Exercises: Standing   Heel Raises 10 reps;2 sets   Heel Raises Limitations toe raises x 10    Lateral Step Up Both;20 reps;Hand Hold: 2;Step Height: 4"  cueing to reduce HHA  Forward Step Up Both;20 reps;Hand Hold: 1;Step Height: 6"   Functional Squat 15 reps   Functional Squat Limitations mini squat    Other Standing Knee Exercises side step with green t-band x 1 RT    Other Standing Knee Exercises tandem stance x3 bilarerally     Knee/Hip Exercises: Seated   Sit to Sand 2 sets;10 reps;without UE support                  PT Short Term Goals - 07/02/16 1431      PT SHORT TERM GOAL #1   Title Pt will demo consistency and independence with his HEP to improve balance and strength   Time 3   Period Weeks   Status On-going     PT SHORT TERM GOAL #2   Title Pt will demo improve ankle PF strength evideny by his ability to perform atleast 5 consecutive heel lifts on each LE, to improve his gait pattern.     Time 4   Period Weeks   Status On-going           PT Long Term Goals - 07/02/16 1432      PT LONG TERM GOAL #1   Title Pt will demo improved B hip strength to atleast 4+/5 MMT to increase his safety with functional tasks.    Time 8   Period Weeks   Status On-going     PT LONG TERM GOAL #2   Title Pt will perform SLS on each LE for up to 10 sec without LOB, 3/5 trials, to decrease risk of falling and injury.   Time 8   Period Weeks   Status On-going     PT LONG TERM GOAL #3   Title Pt will demo improved functional strength evident by his ability to perform 5x sit to stand in atleast 12 sec, without UE and crashing into the chair.    Time 8   Period Weeks   Status On-going     PT LONG TERM GOAL #4   Title Pt will perform TUG in less than 14 sec, with LRAD to represent a decreased is risk of falling in the community.   Time 8   Period Weeks   Status On-going     PT LONG TERM GOAL #5   Title Pt will demo improved hamstring flexibilty to lacking no more than 30 deg BLE, to improve his sitting and standing posture.    Time 8   Period Weeks   Status On-going     PT LONG TERM GOAL #6   Title Pt will demo improved gait mechanics evident by his ability to ambulate atleast 100 ft with noted heel off and minimal knee flexion throughout stance, using LRAD.    Time 8   Period Weeks   Status New               Plan - 07/09/16 1001    Clinical Impression Statement Continued session focus on improving functional strengthening for LE and stretches to hamstrings.  Pt presents with improved functional strengthening noted by improved eccentric control with sit to stand and increased ease and improved form with squats.  Pt continues to require cueing through session to improve posture and to increase distance between feet to reduce scissored gait mechanics.  Pt reports decreased hamstring pain at EOS.     Rehab Potential Good   PT Frequency 2x / week   PT Duration 8 weeks   PT  Treatment/Interventions  ADLs/Self Care Home Management;Gait training;Stair training;Functional mobility training;Therapeutic activities;Orthotic Fit/Training;Therapeutic exercise;Patient/family education;Neuromuscular re-education;Balance training;Manual techniques;Passive range of motion   PT Next Visit Plan Continue with hamstring stretch, hip strength progression, ankle PF strengthening progression   PT Home Exercise Plan Eval: Ankle PF with red TB 2x15, bridge x20; 10/04: STS no HHA      Patient will benefit from skilled therapeutic intervention in order to improve the following deficits and impairments:  Abnormal gait, Decreased activity tolerance, Decreased strength, Impaired flexibility, Pain, Postural dysfunction, Improper body mechanics, Difficulty walking, Decreased mobility, Decreased endurance, Decreased balance, Decreased range of motion  Visit Diagnosis: Muscle weakness (generalized)  Unsteadiness on feet  Other symptoms and signs involving the musculoskeletal system  Other abnormalities of gait and mobility     Problem List Patient Active Problem List   Diagnosis Date Noted  . Arrhythmia 05/20/2016  . Angina decubitus ([place]) 05/19/2016  . Congestive heart failure ([place]) 05/17/2016  . Pain in the chest 05/17/2016  . Pulmonary nodule 05/17/2016  . CAP (community acquired pneumonia) 04/11/2016  . Elevated troponin 04/11/2016  . Nonhealing skin ulcer ([place]) 10/28/2014  . Bilateral carotid artery disease ([place]) 07/08/2014  . Critical lower limb ischemia 02/28/2014  . Obstructive sleep apnea 02/12/2014  . Aftercare following surgery of the circulatory system, [place] 12/04/2013  . Restless leg syndrome 10/23/2013  . Obesity (BMI 30.0-34.9) 10/23/2013  . Hyperlipidemia 07/16/2013  . Peripheral arterial disease ([place]) 07/16/2013  . History of stroke June 2013 03/14/2012  . Hypertension 03/14/2012  . Type 2 diabetes mellitus with circulatory disorder ([place]) 03/14/2012  .  Peripheral neuropathy ([place]) 03/14/2012  . Cardiomyopathy- EF NL 01/2013, now 30-35% echo 03/14/2012  . ARTHRITIS, RIGHT FOOT 06/26/2008    [name] 07/09/2016, 10:15 AM [name], LPTA; [place] [address] [place], [place], [address] Phone: [phone]   Fax:  [phone]  Name: ZADQUIEL TEE MRN: [id number] Date of Birth: 1946-06-05

## 2016-07-12 ENCOUNTER — Ambulatory Visit (HOSPITAL_COMMUNITY): Payer: PPO

## 2016-07-12 DIAGNOSIS — R29898 Other symptoms and signs involving the musculoskeletal system: Secondary | ICD-10-CM

## 2016-07-12 DIAGNOSIS — M6281 Muscle weakness (generalized): Secondary | ICD-10-CM

## 2016-07-12 DIAGNOSIS — R2689 Other abnormalities of gait and mobility: Secondary | ICD-10-CM

## 2016-07-12 DIAGNOSIS — R2681 Unsteadiness on feet: Secondary | ICD-10-CM

## 2016-07-12 NOTE — Therapy (Signed)
[facility] [place], [place], [address] Phone: [phone]   Fax:  [phone]  Physical Therapy Treatment  Patient Details  Name: [name] MRN: [id number] Date of Birth: 1945/11/04 Referring Provider: Erline Hau, DPM  Encounter Date: 07/12/2016      PT End of Session - 07/12/16 1133    Visit Number 6   Number of Visits 17   Date for PT Re-Evaluation 07/21/16   Authorization Type [employer]    Authorization Time Period 06/23/16 to 08/20/16   Authorization - Visit Number 6   Authorization - Number of Visits 10   PT Start Time [id number]   PT Stop Time 1155   PT Time Calculation (min) 40 min   Activity Tolerance Patient tolerated treatment well;No increased pain   Behavior During Therapy WFL for tasks assessed/performed      Past Medical History:  Diagnosis Date  . Arthritis   . Cardiomyopathy- EF NL 01/2013, now 30-35% echo 03/14/2012  . Carotid artery occlusion    left s/p CEA  . Diabetes mellitus   . Hypertension   . Hypothyroidism   . Obstructive sleep apnea   . Peripheral arterial disease ([place]), s/p PTA x 2 RLE    nonhealing ulcers bilaterally on each great toe  . PONV (postoperative nausea and vomiting)   . Restless leg syndrome   . Shortness of breath   . Stroke [facility]) March 08, 2012    Past Surgical History:  Procedure Laterality Date  . ANGIOPLASTY  02/28/14   diamond back orbital rotational atherectomy of Rt. tibial  . BACK SURGERY    . CARDIAC CATHETERIZATION N/A 05/19/2016   Procedure: Right/Left Heart Cath and Coronary Angiography;  Surgeon: Belva Crome, MD;  Location: [place] CV LAB;  Service: Cardiovascular;  Laterality: N/A;  . CERVICAL FUSION    . ENDARTERECTOMY Left 11/29/2013   Procedure: ENDARTERECTOMY CAROTID;  Surgeon: Serafina Mitchell, MD;  Location: [place];  Service: Vascular;  Laterality: Left;  . EYE SURGERY    . FOOT SURGERY    . Lower ext duplex doppler  03/14/14    Rt ABI 1.2  . LOWER EXTREMITY ANGIOGRAM Bilateral 02/18/2014   Procedure: LOWER EXTREMITY ANGIOGRAM;  Surgeon: [name], MD;  Location: [facility] CATH LAB;  Service: Cardiovascular;  Laterality: Bilateral;  . LOWER EXTREMITY ANGIOGRAM N/A 10/31/2014   Procedure: LOWER EXTREMITY ANGIOGRAM;  Surgeon: [name], MD;  Location: [facility] CATH LAB;  Service: Cardiovascular;  Laterality: N/A;  . PV angiogram  02/18/2014   tibial vessel diseas bil.  [name] SPINE SURGERY    . tendon achillies lengthing and sesamoid      There were no vitals filed for this visit.      Subjective Assessment - 07/12/16 1120    Subjective Pt reports he went to [employer] to walk shortly after eatign lunch, and after walking about 20 minutes, he passed out and hit the floor. He thinks it was related to his blood pressure.   Pertinent History Arthritis, HTN, PAD, DM, back surgery   Currently in Pain? No/denies                         [facility] Adult PT Treatment/Exercise - 07/12/16 0001      Knee/Hip Exercises: Stretches   Active Hamstring Stretch Both;3 reps;30 seconds   Active Hamstring Stretch Limitations SEated, with foot elevated 6"    Gastroc Stretch 3  reps;30 seconds  at // bars     Knee/Hip Exercises: Standing   Heel Raises 2 sets;15 reps   Heel Raises Limitations Ankle Dorsiflexion on [place]  2x10   Hip Abduction 1 set;15 reps;Knee straight;Both;Stengthening   Hip Extension 1 set;15 reps;Both;Knee straight   Other Standing Knee Exercises Standing Hip Extension: 1x15 bilat   Other Standing Knee Exercises Narrow Stance: Airex Pad   2x60sec     Knee/Hip Exercises: Seated   Sit to Sand 2 sets;10 reps;without UE support                  PT Short Term Goals - 07/02/16 1431      PT SHORT TERM GOAL #1   Title Pt will demo consistency and independence with his HEP to improve balance and strength   Time 3   Period Weeks   Status On-going     PT SHORT TERM GOAL #2   Title Pt will  demo improve ankle PF strength evideny by his ability to perform atleast 5 consecutive heel lifts on each LE, to improve his gait pattern.    Time 4   Period Weeks   Status On-going           PT Long Term Goals - 07/02/16 1432      PT LONG TERM GOAL #1   Title Pt will demo improved B hip strength to atleast 4+/5 MMT to increase his safety with functional tasks.    Time 8   Period Weeks   Status On-going     PT LONG TERM GOAL #2   Title Pt will perform SLS on each LE for up to 10 sec without LOB, 3/5 trials, to decrease risk of falling and injury.   Time 8   Period Weeks   Status On-going     PT LONG TERM GOAL #3   Title Pt will demo improved functional strength evident by his ability to perform 5x sit to stand in atleast 12 sec, without UE and crashing into the chair.    Time 8   Period Weeks   Status On-going     PT LONG TERM GOAL #4   Title Pt will perform TUG in less than 14 sec, with LRAD to represent a decreased is risk of falling in the community.   Time 8   Period Weeks   Status On-going     PT LONG TERM GOAL #5   Title Pt will demo improved hamstring flexibilty to lacking no more than 30 deg BLE, to improve his sitting and standing posture.    Time 8   Period Weeks   Status On-going     PT LONG TERM GOAL #6   Title Pt will demo improved gait mechanics evident by his ability to ambulate atleast 100 ft with noted heel off and minimal knee flexion throughout stance, using LRAD.    Time 8   Period Weeks   Status New               Plan - 07/12/16 1135    Clinical Impression Statement Pt reponding well to treatment today. Able to progresss exercises without pain, but still somewaht limited by fatigue and tightness. Hamstrings remain most limiting and ankle PF remain somewhat limiting to gait and mechanics.  Pt requires heavy cues for upright stance during transfers, but faily unstable when reomved from slght forward flexion.    Rehab Potential Good   PT  Frequency 2x / week  PT Duration 8 weeks   PT Treatment/Interventions ADLs/Self Care Home Management;Gait training;Stair training;Functional mobility training;Therapeutic activities;Orthotic Fit/Training;Therapeutic exercise;Patient/family education;Neuromuscular re-education;Balance training;Manual techniques;Passive range of motion   PT Next Visit Plan Continue with hamstring stretch, hip strength progression, ankle PF strengthening progression. Progress weights./reps as able.    PT Home Exercise Plan Eval: Ankle PF with red TB 2x15, bridge x20; 10/04: STS no HHA   Consulted and Agree with Plan of Care Patient      Patient will benefit from skilled therapeutic intervention in order to improve the following deficits and impairments:  Abnormal gait, Decreased activity tolerance, Decreased strength, Impaired flexibility, Pain, Postural dysfunction, Improper body mechanics, Difficulty walking, Decreased mobility, Decreased endurance, Decreased balance, Decreased range of motion  Visit Diagnosis: Muscle weakness (generalized)  Unsteadiness on feet  Other symptoms and signs involving the musculoskeletal system  Other abnormalities of gait and mobility     Problem List Patient Active Problem List   Diagnosis Date Noted  . Arrhythmia 05/20/2016  . Angina decubitus ([place]) 05/19/2016  . Congestive heart failure ([place]) 05/17/2016  . Pain in the chest 05/17/2016  . Pulmonary nodule 05/17/2016  . CAP (community acquired pneumonia) 04/11/2016  . Elevated troponin 04/11/2016  . Nonhealing skin ulcer ([place]) 10/28/2014  . Bilateral carotid artery disease ([place]) 07/08/2014  . Critical lower limb ischemia 02/28/2014  . Obstructive sleep apnea 02/12/2014  . Aftercare following surgery of the circulatory system, [place] 12/04/2013  . Restless leg syndrome 10/23/2013  . Obesity (BMI 30.0-34.9) 10/23/2013  . Hyperlipidemia 07/16/2013  . Peripheral arterial disease ([place]) 07/16/2013  . History of stroke  June 2013 03/14/2012  . Hypertension 03/14/2012  . Type 2 diabetes mellitus with circulatory disorder ([place]) 03/14/2012  . Peripheral neuropathy ([place]) 03/14/2012  . Cardiomyopathy- EF NL 01/2013, now 30-35% echo 03/14/2012  . ARTHRITIS, RIGHT FOOT 06/26/2008    12:05 PM, 07/12/16 Etta Grandchild, PT, DPT Physical Therapist at [facility] [phone] (office)     [facility] [address] [place], [place], [address] Phone: [phone]   Fax:  [phone]  Name: [name] MRN: [id number] Date of Birth: 1946/09/09

## 2016-07-13 ENCOUNTER — Telehealth: Payer: Self-pay | Admitting: Cardiovascular Disease

## 2016-07-13 ENCOUNTER — Other Ambulatory Visit: Payer: Self-pay

## 2016-07-13 NOTE — Telephone Encounter (Signed)
patient states he and wife ate lunch then  went to [employer] - walked around for about an hour on Friday. Per patient,as they were leaving .Patient state he went out and wife and other customer assisted him. Patient states he came to as some one was assisting. Patient states he went to the car and sat there for a while then went home. Patient states he did not use the pills that were given to him because he did not have any symptoms prior to him passing out "dizziness". Patient states he did [place] on after wards because he felt dizzy.  Patient states no issue since,wanted P.A. RN offered an appointment today. Patient declined. He states he does not think he needs to come in to see some one.patient states he wanted office to be aware.  RN informed patient , if occurs again call EMS for evaluation. Will defer to Rosaria Ferries PA and Dr Gwenlyn Found. Patient verbalized understanding

## 2016-07-13 NOTE — Patient Outreach (Signed)
[facility]) Care Management  07/13/2016  [name] September 04, 1946 [id number]   2nd Telephone call to patient for initial assessment.  No answer. HIPAA compliant voice message left.   Plan: RN Health Coach will contact patient in the month of November.  Jone Baseman, RN, MSN [place] [phone]

## 2016-07-13 NOTE — Telephone Encounter (Signed)
New message      Pt c/o Syncope: STAT if syncope occurred within 30 minutes and pt complains of lightheadedness High Priority if episode of passing out, completely, today or in last 24 hours   1. Did you pass out today?  no  When is the last time you passed out? Last Friday pt was at [facility] and passed out.  No prior symptoms. He did not go to the ER and the EMS was not called 2. Has this occurred multiple times? no  Did you have any symptoms prior to passing out?  Pt was walking and passed out

## 2016-07-13 NOTE — Telephone Encounter (Signed)
Please let him know to continue to hold the Lasix if he has not restarted it. If he is on it, hold it for 3 days and restart it at one pill daily.

## 2016-07-14 NOTE — Telephone Encounter (Signed)
I could not reach patient when I called him back. Will plan to check in with him later in the week and discuss the recommended follow up visit, and his symptoms at that time.

## 2016-07-14 NOTE — Telephone Encounter (Signed)
Rhonda's advice communicated to patient. He acknowledged. He will hold lasix for 3 more days, resume at 1 pill a day. I have advised pt to call back if further issues or new problems & to update [place] in a few days w his progress. Pt voiced understanding and thanks.

## 2016-07-14 NOTE — Telephone Encounter (Signed)
Sounds fine with me. Prob needs to come back in for a MLP visit to re assess

## 2016-07-15 ENCOUNTER — Encounter: Payer: Self-pay | Admitting: Family

## 2016-07-16 ENCOUNTER — Ambulatory Visit (HOSPITAL_COMMUNITY): Payer: PPO | Admitting: Physical Therapy

## 2016-07-16 DIAGNOSIS — M6281 Muscle weakness (generalized): Secondary | ICD-10-CM

## 2016-07-16 DIAGNOSIS — R2681 Unsteadiness on feet: Secondary | ICD-10-CM

## 2016-07-16 DIAGNOSIS — R29898 Other symptoms and signs involving the musculoskeletal system: Secondary | ICD-10-CM

## 2016-07-16 DIAGNOSIS — R2689 Other abnormalities of gait and mobility: Secondary | ICD-10-CM

## 2016-07-16 NOTE — Telephone Encounter (Signed)
Left msg for patient to call. 

## 2016-07-16 NOTE — Therapy (Signed)
[facility] [address] [place], [place], [address] Phone: [phone]   Fax:  [phone]  Physical Therapy Treatment  Patient Details  Name: [name] MRN: [id number] Date of Birth: September 09, 1946 Referring Provider: Erline Hau, DPM  Encounter Date: 07/16/2016      PT End of Session - 07/16/16 0948    Visit Number 7   Number of Visits 17   Date for PT Re-Evaluation 07/21/16   Authorization Type [employer]    Authorization Time Period 06/23/16 to 08/20/16   Authorization - Visit Number 7   Authorization - Number of Visits 10   PT Start Time [id number]   PT Stop Time 0948   PT Time Calculation (min) 43 min   Activity Tolerance Patient tolerated treatment well;No increased pain   Behavior During Therapy WFL for tasks assessed/performed      Past Medical History:  Diagnosis Date  . Arthritis   . Cardiomyopathy- EF NL 01/2013, now 30-35% echo 03/14/2012  . Carotid artery occlusion    left s/p CEA  . Diabetes mellitus   . Hypertension   . Hypothyroidism   . Obstructive sleep apnea   . Peripheral arterial disease ([place]), s/p PTA x 2 RLE    nonhealing ulcers bilaterally on each great toe  . PONV (postoperative nausea and vomiting)   . Restless leg syndrome   . Shortness of breath   . Stroke [facility]) March 08, 2012    Past Surgical History:  Procedure Laterality Date  . ANGIOPLASTY  02/28/14   diamond back orbital rotational atherectomy of Rt. tibial  . BACK SURGERY    . CARDIAC CATHETERIZATION N/A 05/19/2016   Procedure: Right/Left Heart Cath and Coronary Angiography;  Surgeon: Belva Crome, MD;  Location: [place] CV LAB;  Service: Cardiovascular;  Laterality: N/A;  . CERVICAL FUSION    . ENDARTERECTOMY Left 11/29/2013   Procedure: ENDARTERECTOMY CAROTID;  Surgeon: Serafina Mitchell, MD;  Location: [place];  Service: Vascular;  Laterality: Left;  . EYE SURGERY    . FOOT SURGERY    . Lower ext duplex doppler  03/14/14    Rt ABI 1.2  . LOWER EXTREMITY ANGIOGRAM Bilateral 02/18/2014   Procedure: LOWER EXTREMITY ANGIOGRAM;  Surgeon: [name], MD;  Location: [facility] CATH LAB;  Service: Cardiovascular;  Laterality: Bilateral;  . LOWER EXTREMITY ANGIOGRAM N/A 10/31/2014   Procedure: LOWER EXTREMITY ANGIOGRAM;  Surgeon: [name], MD;  Location: [facility] CATH LAB;  Service: Cardiovascular;  Laterality: N/A;  . PV angiogram  02/18/2014   tibial vessel diseas bil.  [name] SPINE SURGERY    . tendon achillies lengthing and sesamoid      There were no vitals filed for this visit.      Subjective Assessment - 07/16/16 0924    Subjective Pt states his Lt knee buckles on him frequently    Pertinent History Arthritis, HTN, PAD, DM, back surgery   Currently in Pain? No/denies                         [facility] Adult PT Treatment/Exercise - 07/16/16 0001      Knee/Hip Exercises: Stretches   Passive Hamstring Stretch Both;60 seconds   Hip Flexor Stretch Both;60 seconds     Knee/Hip Exercises: Standing   Heel Raises Both;10 reps   Heel Raises Limitations dorsiflexion x 10    Forward Step Up Both;10 reps   Functional Squat 10 reps  Other Standing Knee Exercises hip abduction and extension 10 x B with 3# wt    Other Standing Knee Exercises narrow stance on foam with head turns ; tandem stace with head turns x 2 30"     Knee/Hip Exercises: Seated   Sit to Sand 10 reps                  PT Short Term Goals - 07/02/16 1431      PT SHORT TERM GOAL #1   Title Pt will demo consistency and independence with his HEP to improve balance and strength   Time 3   Period Weeks   Status On-going     PT SHORT TERM GOAL #2   Title Pt will demo improve ankle PF strength evideny by his ability to perform atleast 5 consecutive heel lifts on each LE, to improve his gait pattern.    Time 4   Period Weeks   Status On-going           PT Long Term Goals - 07/02/16 1432      PT LONG TERM GOAL #1    Title Pt will demo improved B hip strength to atleast 4+/5 MMT to increase his safety with functional tasks.    Time 8   Period Weeks   Status On-going     PT LONG TERM GOAL #2   Title Pt will perform SLS on each LE for up to 10 sec without LOB, 3/5 trials, to decrease risk of falling and injury.   Time 8   Period Weeks   Status On-going     PT LONG TERM GOAL #3   Title Pt will demo improved functional strength evident by his ability to perform 5x sit to stand in atleast 12 sec, without UE and crashing into the chair.    Time 8   Period Weeks   Status On-going     PT LONG TERM GOAL #4   Title Pt will perform TUG in less than 14 sec, with LRAD to represent a decreased is risk of falling in the community.   Time 8   Period Weeks   Status On-going     PT LONG TERM GOAL #5   Title Pt will demo improved hamstring flexibilty to lacking no more than 30 deg BLE, to improve his sitting and standing posture.    Time 8   Period Weeks   Status On-going     PT LONG TERM GOAL #6   Title Pt will demo improved gait mechanics evident by his ability to ambulate atleast 100 ft with noted heel off and minimal knee flexion throughout stance, using LRAD.    Time 8   Period Weeks   Status New               Plan - 07/16/16 0949    Clinical Impression Statement Pt needed to sit down and take medication during treatment today due to blood pressure being to low. All exercises completed with therapist facilitation for safety and proper technique    Rehab Potential Good   PT Frequency 2x / week   PT Duration 8 weeks   PT Treatment/Interventions ADLs/Self Care Home Management;Gait training;Stair training;Functional mobility training;Therapeutic activities;Orthotic Fit/Training;Therapeutic exercise;Patient/family education;Neuromuscular re-education;Balance training;Manual techniques;Passive range of motion   PT Next Visit Plan Continue with hamstring stretch, hip strength progression, ankle PF  strengthening progression. Progress weights./reps as able.    PT Home Exercise Plan Eval: Ankle PF with red TB 2x15,  bridge x20; 10/04: STS no HHA   Consulted and Agree with Plan of Care Patient      Patient will benefit from skilled therapeutic intervention in order to improve the following deficits and impairments:  Abnormal gait, Decreased activity tolerance, Decreased strength, Impaired flexibility, Pain, Postural dysfunction, Improper body mechanics, Difficulty walking, Decreased mobility, Decreased endurance, Decreased balance, Decreased range of motion  Visit Diagnosis: Muscle weakness (generalized)  Unsteadiness on feet  Other symptoms and signs involving the musculoskeletal system  Other abnormalities of gait and mobility     Problem List Patient Active Problem List   Diagnosis Date Noted  . Arrhythmia 05/20/2016  . Angina decubitus ([place]) 05/19/2016  . Congestive heart failure ([place]) 05/17/2016  . Pain in the chest 05/17/2016  . Pulmonary nodule 05/17/2016  . CAP (community acquired pneumonia) 04/11/2016  . Elevated troponin 04/11/2016  . Nonhealing skin ulcer ([place]) 10/28/2014  . Bilateral carotid artery disease ([place]) 07/08/2014  . Critical lower limb ischemia 02/28/2014  . Obstructive sleep apnea 02/12/2014  . Aftercare following surgery of the circulatory system, [place] 12/04/2013  . Restless leg syndrome 10/23/2013  . Obesity (BMI 30.0-34.9) 10/23/2013  . Hyperlipidemia 07/16/2013  . Peripheral arterial disease ([place]) 07/16/2013  . History of stroke June 2013 03/14/2012  . Hypertension 03/14/2012  . Type 2 diabetes mellitus with circulatory disorder ([place]) 03/14/2012  . Peripheral neuropathy ([place]) 03/14/2012  . Cardiomyopathy- EF NL 01/2013, now 30-35% echo 03/14/2012  . ARTHRITIS, RIGHT FOOT 06/26/2008    Rayetta Humphrey, PT CLT [phone] 07/16/2016, 9:53 AM  [facility] [address] [place], [place],  [address] Phone: [phone]   Fax:  [phone]  Name: [name] MRN: [id number] Date of Birth: 1946/02/19

## 2016-07-19 ENCOUNTER — Ambulatory Visit (INDEPENDENT_AMBULATORY_CARE_PROVIDER_SITE_OTHER): Payer: PPO | Admitting: Family

## 2016-07-19 ENCOUNTER — Ambulatory Visit (HOSPITAL_COMMUNITY)
Admission: RE | Admit: 2016-07-19 | Discharge: 2016-07-19 | Disposition: A | Discharge status: Home / Self Care | Payer: PPO | Source: Ambulatory Visit | Attending: Family | Admitting: Family

## 2016-07-19 ENCOUNTER — Encounter: Payer: Self-pay | Admitting: Family

## 2016-07-19 VITALS — BP 104/66 | HR 82 | Temp 97.3°F | Resp 18 | Ht 72.0 in | Wt 236.1 lb

## 2016-07-19 DIAGNOSIS — Z9889 Other specified postprocedural states: Secondary | ICD-10-CM | POA: Diagnosis not present

## 2016-07-19 DIAGNOSIS — I6521 Occlusion and stenosis of right carotid artery: Secondary | ICD-10-CM | POA: Diagnosis not present

## 2016-07-19 DIAGNOSIS — I6523 Occlusion and stenosis of bilateral carotid arteries: Secondary | ICD-10-CM

## 2016-07-19 DIAGNOSIS — Z48812 Encounter for surgical aftercare following surgery on the circulatory system: Secondary | ICD-10-CM

## 2016-07-19 LAB — VAS US CAROTID
LEFT ECA DIAS: -11 cm/s
Left CCA dist dias: 14 cm/s
Left CCA dist sys: 82 cm/s
Left CCA prox dias: 21 cm/s
Left CCA prox sys: 145 cm/s
Left ICA prox dias: -27 cm/s
Left ICA prox sys: -90 cm/s
RIGHT CCA MID DIAS: 24 cm/s
RIGHT ECA DIAS: -3 cm/s
RIGHT VERTEBRAL DIAS: 12 cm/s
Right CCA prox dias: 16 cm/s
Right CCA prox sys: 87 cm/s
Right cca dist sys: -77 cm/s

## 2016-07-19 NOTE — Telephone Encounter (Signed)
Message left for patient to call.

## 2016-07-19 NOTE — Progress Notes (Signed)
Chief Complaint: Follow up Extracranial Carotid Artery Stenosis   History of Present Illness  [name] is a 70 y.o. male patient of Dr. [name] who has known carotid stenosis is here for f/u carotid duplex scan after left CEA on 11/29/13.  He states that he has undergone angiogram by Dr. Gwenlyn Found with "roto rooter" of his right leg. He states that he does have problems with neuropathy and restless leg syndrome. He states that he goes to the podiatrist for foot care. He reports a stroke in 2013 or 2014 as manifested by expressive aphasia, no strokes or TIA's subsequently.  He denies any history of amaurosis fugax, paresthesias, or hemiparesis.  He is on a statin for his hypercholesterolemia. He is on po medications for his diabetes. He is on multiple medications for hypertension.   He had a cardiac cath in August 2017: Diffuse moderate to severe LAD disease. The most severe region is in the mid to distal vessel and is somewhat eccentric with up to 80% obstruction.Widely patent RCA and circumflex. This is being medically treated.  Pt states he has had left leg lag that is not worsening, unclear etiology.  Pt Diabetic: Yes, seems in fairly good control Pt smoker: former smoker  Pt meds include: Statin : Yes ASA: Yes Other anticoagulants/antiplatelets: Plavix    Past Medical History:  Diagnosis Date  . Arthritis   . Cardiomyopathy- EF NL 01/2013, now 30-35% echo 03/14/2012  . Carotid artery occlusion    left s/p CEA  . Diabetes mellitus   . Hypertension   . Hypothyroidism   . Obstructive sleep apnea   . Peripheral arterial disease ([place]), s/p PTA x 2 RLE    nonhealing ulcers bilaterally on each great toe  . PONV (postoperative nausea and vomiting)   . Restless leg syndrome   . Shortness of breath   . Stroke [facility]) March 08, 2012    Social History Social History  Substance Use Topics  . Smoking status: Former Smoker    Years: 1.00    Types: Pipe    Quit date:  07/08/1977  . Smokeless tobacco: Never Used  . Alcohol use No    Family History Family History  Problem Relation Age of Onset  . Heart disease Mother   . Hypertension Mother   . Heart attack Mother   . Hypertension Father   . Diabetes Father   . Diabetes Son   . Heart disease Son   . Hypertension Son     Surgical History Past Surgical History:  Procedure Laterality Date  . ANGIOPLASTY  02/28/14   diamond back orbital rotational atherectomy of Rt. tibial  . BACK SURGERY    . CARDIAC CATHETERIZATION N/A 05/19/2016   Procedure: Right/Left Heart Cath and Coronary Angiography;  Surgeon: Belva Crome, MD;  Location: [place] CV [facility];  Service: Cardiovascular;  Laterality: N/A;  . CERVICAL FUSION    . ENDARTERECTOMY Left 11/29/2013   Procedure: ENDARTERECTOMY CAROTID;  Surgeon: Serafina Mitchell, MD;  Location: [place];  Service: Vascular;  Laterality: Left;  . EYE SURGERY    . FOOT SURGERY    . Lower ext duplex doppler  03/14/14   Rt ABI 1.2  . LOWER EXTREMITY ANGIOGRAM Bilateral 02/18/2014   Procedure: LOWER EXTREMITY ANGIOGRAM;  Surgeon: [name], MD;  Location: [facility] C[facility] LAB;  Service: Cardiovascular;  Laterality: Bilateral;  . LOWER EXTREMITY ANGIOGRAM N/A 10/31/2014   Procedure: LOWER EXTREMITY ANGIOGRAM;  Surgeon: [name], MD;  Location: [place] CATH LAB;  Service: Cardiovascular;  Laterality: N/A;  . PV angiogram  02/18/2014   tibial vessel diseas bil.  [name] SPINE SURGERY    . tendon achillies lengthing and sesamoid      Allergies  Allergen Reactions  . Codeine Nausea And Vomiting  . Tramadol Nausea Only    Current Outpatient Prescriptions  Medication Sig Dispense Refill  . aspirin EC 81 MG EC tablet Take 1 tablet (81 mg total) by mouth daily.    [name] b complex vitamins tablet Take 1 tablet by mouth daily.    . carvedilol (COREG) 3.125 MG tablet Take 1 tablet (3.125 mg total) by mouth 2 (two) times daily with a meal. 60 tablet 11  . clopidogrel (PLAVIX) 75 MG tablet  Take 75 mg by mouth daily.     . furosemide (LASIX) 40 MG tablet Take 1 tablet (40 mg total) by mouth 2 (two) times daily. (Patient taking differently: Take 40 mg by mouth daily. ) 60 tablet 3  . gabapentin (NEURONTIN) 600 MG tablet Take 600 mg by mouth 4 (four) times daily.     [name] glimepiride (AMARYL) 4 MG tablet Take 4 mg by mouth daily with breakfast.    . levothyroxine (SYNTHROID, LEVOTHROID) 25 MCG tablet Take 50 mcg by mouth daily.     . midodrine (PROAMATINE) 5 MG tablet Take 1 tablet (5 mg total) by mouth daily as needed. For dizziness or low blood pressures. 30 tablet 1  . nitroGLYCERIN (NITROSTAT) 0.4 MG SL tablet Place 1 tablet (0.4 mg total) under the tongue every 5 (five) minutes as needed for chest pain. 25 tablet 3  . Omega 3-6-9 Fatty Acids (OMEGA 3-6-9 COMPLEX) CAPS Take 1 capsule by mouth 2 (two) times daily.    . potassium chloride SA (K-DUR,KLOR-CON) 20 MEQ tablet Take 1 tablet (20 mEq total) by mouth daily. 30 tablet 11  . pravastatin (PRAVACHOL) 40 MG tablet Take 40 mg by mouth every morning.     [name] rOPINIRole (REQUIP) 0.5 MG tablet Take 1 tablet (0.5 mg total) by mouth at bedtime. (Patient taking differently: Take 0.5 mg by mouth 2 (two) times daily. ) 30 tablet 11  . Thiamine Mononitrate (VITAMIN B1 PO) Take 1,000 mg by mouth 2 (two) times daily.     No current facility-administered medications for this visit.     Review of Systems : See HPI for pertinent positives and negatives.  Physical Examination  Vitals:   07/19/16 1459 07/19/16 1500  BP: 113/65 104/66  Pulse: 82   Resp: 18   Temp: 97.3 F (36.3 C)   TempSrc: Oral   SpO2: 94%   Weight: 236 lb 1.6 oz (107.1 kg)   Height: 6' (1.829 m)    Body mass index is 32.02 kg/m.  General: WDWN obese male in NAD GAIT: left leg lag, using cane Eyes: PERRLA Pulmonary: Respirations are non-labored at rest, slightly dyspneic with talking, limited air movement in all fields. Moist cough.   Cardiac: regular rhythm,  no detected murmur.  VASCULAR EXAM Carotid Bruits Left Right   Negative Negative   Radial pulses are 2+ palpable and equal.      LE Pulses LEFT RIGHT   POPLITEAL not palpable  not palpable       PT Not palpable Not palpable       DP Not palpable Not palpable    Gastrointestinal: soft, nontender, BS WNL, no r/g,no palpable masses.  Musculoskeletal: No muscle atrophy/wasting. M/S 5/5 throughout, Extremities without  ischemic changes.  Neurologic: A&O X 3; Appropriate Affect ; SENSATION ;normal;  Speech is normal CN 2-12 intact except some numbness at left side of mouth, has some hearing loss, Pain and light touch intact in extremities, Motor exam as listed above.    Assessment: [name] is a 70 y.o. male who is s/p left CEA on 11/29/13. He had a stroke in 2013 or 2014 as manifested by expressive aphasia, no strokes or TIA's subsequently.  DATA Today's carotid duplex suggests less than 40% right internal carotid artery stenosis and patent left carotid endarterectomy site with no evidence for restenosis.  Bilateral vertebral arteries are antegrade. Bilateral subclavian artery waveforms are triphasic.  No significant change since prior exam of 07/14/15.  Lower limb arterial disease s/p atherectomy right leg per Dr. Gwenlyn Found.   Plan: Follow-up in 1 year with Carotid Duplex scan.   I discussed in depth with the patient the nature of atherosclerosis, and emphasized the importance of maximal medical management including strict control of blood pressure, blood glucose, and lipid levels, obtaining regular exercise, and continued cessation of smoking.  The patient is aware that without maximal medical management the underlying atherosclerotic disease process will progress, limiting the benefit of any interventions. The patient  was given information about stroke prevention and what symptoms should prompt the patient to seek immediate medical care. Thank you for allowing [place] to participate in this patient's care.  Clemon Chambers, RN, MSN, FNP-C [facility] Office: [facility] Physician: [name]  07/19/16 3:03 PM

## 2016-07-19 NOTE — Patient Instructions (Signed)
Stroke Prevention Some medical conditions and behaviors are associated with an increased chance of having a stroke. You may prevent a stroke by making healthy choices and managing medical conditions. HOW CAN I REDUCE MY RISK OF HAVING A STROKE?   Stay physically active. Get at least 30 minutes of activity on most or all days.  Do not smoke. It may also be helpful to avoid exposure to secondhand smoke.  Limit alcohol use. Moderate alcohol use is considered to be:  No more than 2 drinks per day for men.  No more than 1 drink per day for nonpregnant women.  Eat healthy foods. This involves:  Eating 5 or more servings of fruits and vegetables a day.  Making dietary changes that address high blood pressure (hypertension), high cholesterol, diabetes, or obesity.  Manage your cholesterol levels.  Making food choices that are high in fiber and low in saturated fat, trans fat, and cholesterol may control cholesterol levels.  Take any prescribed medicines to control cholesterol as directed by your health care provider.  Manage your diabetes.  Controlling your carbohydrate and sugar intake is recommended to manage diabetes.  Take any prescribed medicines to control diabetes as directed by your health care provider.  Control your hypertension.  Making food choices that are low in salt (sodium), saturated fat, trans fat, and cholesterol is recommended to manage hypertension.  Ask your health care provider if you need treatment to lower your blood pressure. Take any prescribed medicines to control hypertension as directed by your health care provider.  If you are 18-39 years of age, have your blood pressure checked every 3-5 years. If you are 40 years of age or older, have your blood pressure checked every year.  Maintain a healthy weight.  Reducing calorie intake and making food choices that are low in sodium, saturated fat, trans fat, and cholesterol are recommended to manage  weight.  Stop drug abuse.  Avoid taking birth control pills.  Talk to your health care provider about the risks of taking birth control pills if you are over 35 years old, smoke, get migraines, or have ever had a blood clot.  Get evaluated for sleep disorders (sleep apnea).  Talk to your health care provider about getting a sleep evaluation if you snore a lot or have excessive sleepiness.  Take medicines only as directed by your health care provider.  For some people, aspirin or blood thinners (anticoagulants) are helpful in reducing the risk of forming abnormal blood clots that can lead to stroke. If you have the irregular heart rhythm of atrial fibrillation, you should be on a blood thinner unless there is a good reason you cannot take them.  Understand all your medicine instructions.  Make sure that other conditions (such as anemia or atherosclerosis) are addressed. SEEK IMMEDIATE MEDICAL CARE IF:   You have sudden weakness or numbness of the face, arm, or leg, especially on one side of the body.  Your face or eyelid droops to one side.  You have sudden confusion.  You have trouble speaking (aphasia) or understanding.  You have sudden trouble seeing in one or both eyes.  You have sudden trouble walking.  You have dizziness.  You have a loss of balance or coordination.  You have a sudden, severe headache with no known cause.  You have new chest pain or an irregular heartbeat. Any of these symptoms may represent a serious problem that is an emergency. Do not wait to see if the symptoms will   go away. Get medical help at once. Call your local emergency services (911 in [place] Do not drive yourself to the hospital.   This information is not intended to replace advice given to you by your health care provider. Make sure you discuss any questions you have with your health care provider.   Document Released: 10/14/2004 Document Revised: 09/27/2014 Document Reviewed:  03/09/2013 Elsevier Interactive Patient Education 2016 Elsevier Inc.  

## 2016-07-20 ENCOUNTER — Ambulatory Visit (HOSPITAL_COMMUNITY): Payer: PPO

## 2016-07-20 ENCOUNTER — Other Ambulatory Visit: Payer: Self-pay | Admitting: *Deleted

## 2016-07-20 DIAGNOSIS — R29898 Other symptoms and signs involving the musculoskeletal system: Secondary | ICD-10-CM

## 2016-07-20 DIAGNOSIS — M6281 Muscle weakness (generalized): Secondary | ICD-10-CM

## 2016-07-20 DIAGNOSIS — R2681 Unsteadiness on feet: Secondary | ICD-10-CM

## 2016-07-20 DIAGNOSIS — R2689 Other abnormalities of gait and mobility: Secondary | ICD-10-CM

## 2016-07-20 DIAGNOSIS — I6523 Occlusion and stenosis of bilateral carotid arteries: Secondary | ICD-10-CM

## 2016-07-20 NOTE — Therapy (Signed)
[place], [place], [address] Phone: [phone]   Fax:  [phone]  Physical Therapy Treatment  Patient Details  Name: KASAUN BARBOSA MRN: [id number] Date of Birth: [birth year] Referring Provider: Erline Hau, DPM  Encounter Date: 07/20/2016      PT End of Session - 07/20/16 0821    Visit Number 8   Number of Visits 17   Date for PT Re-Evaluation 07/21/16   Authorization Type [employer]    Authorization Time Period 06/23/16 to 08/20/16   Authorization - Visit Number 8   Authorization - Number of Visits 10   PT Start Time 0816   PT Stop Time 0903   PT Time Calculation (min) 47 min   Equipment Utilized During Treatment Gait belt   Activity Tolerance Patient tolerated treatment well;No increased pain   Behavior During Therapy WFL for tasks assessed/performed      Past Medical History:  Diagnosis Date  . Arthritis   . Cardiomyopathy- EF NL 01/2013, now 30-35% echo 03/14/2012  . Carotid artery occlusion    left s/p CEA  . Diabetes mellitus   . Hypertension   . Hypothyroidism   . Obstructive sleep apnea   . Peripheral arterial disease ([place]), s/p PTA x 2 RLE    nonhealing ulcers bilaterally on each great toe  . PONV (postoperative nausea and vomiting)   . Restless leg syndrome   . Shortness of breath   . Stroke [facility]) March 08, 2012    Past Surgical History:  Procedure Laterality Date  . ANGIOPLASTY  02/28/14   diamond back orbital rotational atherectomy of Rt. tibial  . BACK SURGERY    . CARDIAC CATHETERIZATION N/A 05/19/2016   Procedure: Right/Left Heart Cath and Coronary Angiography;  Surgeon: Belva Crome, MD;  Location: [place] CV LAB;  Service: Cardiovascular;  Laterality: N/A;  . CERVICAL FUSION    . ENDARTERECTOMY Left 11/29/2013   Procedure: ENDARTERECTOMY CAROTID;  Surgeon: Serafina Mitchell, MD;  Location: [place];  Service: Vascular;  Laterality: Left;  . EYE SURGERY    . FOOT  SURGERY    . Lower ext duplex doppler  03/14/14   Rt ABI 1.2  . LOWER EXTREMITY ANGIOGRAM Bilateral 02/18/2014   Procedure: LOWER EXTREMITY ANGIOGRAM;  Surgeon: [name], MD;  Location: [facility] CATH LAB;  Service: Cardiovascular;  Laterality: Bilateral;  . LOWER EXTREMITY ANGIOGRAM N/A 10/31/2014   Procedure: LOWER EXTREMITY ANGIOGRAM;  Surgeon: [name], MD;  Location: [facility] CATH LAB;  Service: Cardiovascular;  Laterality: N/A;  . PV angiogram  02/18/2014   tibial vessel diseas bil.  [name] SPINE SURGERY    . tendon achillies lengthing and sesamoid      There were no vitals filed for this visit.      Subjective Assessment - 07/20/16 0816    Subjective Pt stated he is feeling good today.  No reports of pain    Pertinent History Arthritis, HTN, PAD, DM, back surgery   Currently in Pain? No/denies             [facility] Adult PT Treatment/Exercise - 07/20/16 0001      Knee/Hip Exercises: Standing   Heel Raises Both;10 reps   Heel Raises Limitations dorsiflexion x 10    Hip Abduction 1 set;15 reps;Knee straight;Both;Stengthening   Lateral Step Up Both;20 reps;Hand Hold: 2;Step Height: 4"   Forward Step Up Both;10 reps;Hand Hold: 1;Step Height: 6"   Functional Squat 10  reps   Functional Squat Limitations mini squat    Other Standing Knee Exercises sidestep 1RT RTB     Knee/Hip Exercises: Seated   Sit to Sand 10 reps;without UE support     Ankle Exercises: Seated   Other Seated Ankle Exercises RTB PF/ DF 10reps                  PT Short Term Goals - 07/02/16 1431      PT SHORT TERM GOAL #1   Title Pt will demo consistency and independence with his HEP to improve balance and strength   Time 3   Period Weeks   Status On-going     PT SHORT TERM GOAL #2   Title Pt will demo improve ankle PF strength evideny by his ability to perform atleast 5 consecutive heel lifts on each LE, to improve his gait pattern.    Time 4   Period Weeks   Status On-going            PT Long Term Goals - 07/02/16 1432      PT LONG TERM GOAL #1   Title Pt will demo improved B hip strength to atleast 4+/5 MMT to increase his safety with functional tasks.    Time 8   Period Weeks   Status On-going     PT LONG TERM GOAL #2   Title Pt will perform SLS on each LE for up to 10 sec without LOB, 3/5 trials, to decrease risk of falling and injury.   Time 8   Period Weeks   Status On-going     PT LONG TERM GOAL #3   Title Pt will demo improved functional strength evident by his ability to perform 5x sit to stand in atleast 12 sec, without UE and crashing into the chair.    Time 8   Period Weeks   Status On-going     PT LONG TERM GOAL #4   Title Pt will perform TUG in less than 14 sec, with LRAD to represent a decreased is risk of falling in the community.   Time 8   Period Weeks   Status On-going     PT LONG TERM GOAL #5   Title Pt will demo improved hamstring flexibilty to lacking no more than 30 deg BLE, to improve his sitting and standing posture.    Time 8   Period Weeks   Status On-going     PT LONG TERM GOAL #6   Title Pt will demo improved gait mechanics evident by his ability to ambulate atleast 100 ft with noted heel off and minimal knee flexion throughout stance, using LRAD.    Time 8   Period Weeks   Status New               Plan - 07/20/16 0936    Clinical Impression Statement Pt with c/o dizziness with sit to stand activity today, BP taken at 122/66 mmHg,  Pt stated he has apt with MD to discuss BP later this week.  Session focus on functional strengthening with therapist facilitton for safety and proper form/technique with therex.  Reviewed HEP, pt encouraged to increase frequency with theraband therex.     Rehab Potential Good   PT Frequency 2x / week   PT Duration 8 weeks   PT Treatment/Interventions ADLs/Self Care Home Management;Gait training;Stair training;Functional mobility training;Therapeutic activities;Orthotic  Fit/Training;Therapeutic exercise;Patient/family education;Neuromuscular re-education;Balance training;Manual techniques;Passive range of motion   PT Next Visit Plan Continue  with hamstring stretch, hip strength progression, ankle PF strengthening progression. Progress weights./reps as able.    PT Home Exercise Plan Eval: Ankle PF with red TB 2x15, bridge x20; 10/04: STS no HHA      Patient will benefit from skilled therapeutic intervention in order to improve the following deficits and impairments:  Abnormal gait, Decreased activity tolerance, Decreased strength, Impaired flexibility, Pain, Postural dysfunction, Improper body mechanics, Difficulty walking, Decreased mobility, Decreased endurance, Decreased balance, Decreased range of motion  Visit Diagnosis: Muscle weakness (generalized)  Unsteadiness on feet  Other symptoms and signs involving the musculoskeletal system  Other abnormalities of gait and mobility     Problem List Patient Active Problem List   Diagnosis Date Noted  . Arrhythmia 05/20/2016  . Angina decubitus ([place]) 05/19/2016  . Congestive heart failure ([place]) 05/17/2016  . Pain in the chest 05/17/2016  . Pulmonary nodule 05/17/2016  . CAP (community acquired pneumonia) 04/11/2016  . Elevated troponin 04/11/2016  . Nonhealing skin ulcer ([place]) 10/28/2014  . Bilateral carotid artery disease ([place]) 07/08/2014  . Critical lower limb ischemia 02/28/2014  . Obstructive sleep apnea 02/12/2014  . Aftercare following surgery of the circulatory system, [place] 12/04/2013  . Restless leg syndrome 10/23/2013  . Obesity (BMI 30.0-34.9) 10/23/2013  . Hyperlipidemia 07/16/2013  . Peripheral arterial disease ([place]) 07/16/2013  . History of stroke June 2013 03/14/2012  . Hypertension 03/14/2012  . Type 2 diabetes mellitus with circulatory disorder ([place]) 03/14/2012  . Peripheral neuropathy ([place]) 03/14/2012  . Cardiomyopathy- EF NL 01/2013, now 30-35% echo 03/14/2012  . ARTHRITIS,  RIGHT FOOT 06/26/2008   [name], [place]; [place]  [name] 07/20/2016, 12:18 PM  [place], [place], [address] Phone: [phone]   Fax:  [phone]  Name: LAVONTAY MISH MRN: [id number] Date of Birth: 09/23/1945

## 2016-07-22 ENCOUNTER — Ambulatory Visit: Payer: Self-pay

## 2016-07-22 ENCOUNTER — Ambulatory Visit (INDEPENDENT_AMBULATORY_CARE_PROVIDER_SITE_OTHER): Payer: PPO | Admitting: Pharmacist Clinician (PhC)/ Clinical Pharmacy Specialist

## 2016-07-22 DIAGNOSIS — I10 Essential (primary) hypertension: Secondary | ICD-10-CM | POA: Diagnosis not present

## 2016-07-22 NOTE — Patient Instructions (Signed)
Return for a a follow up appointment in 6 wks  Your blood pressure today is 146/70 (seated)  Check your blood pressure at home daily and keep record of the readings.  Take your BP meds as follows:  Continue with your current medications.   Try to increase salt intake from 2 gm to 3-3.5 gm per day.  Please watch your daily body weight and for any signs of swelling/SOB.  Bring your record of home blood pressures to your next appointment.  Exercise as you're able, try to walk approximately 30 minutes per day.  Keep salt intake to a minimum, especially watch canned and prepared boxed foods.  Eat more fresh fruits and vegetables and fewer canned items.  Avoid eating in fast food restaurants.    HOW TO TAKE YOUR BLOOD PRESSURE: . Rest 5 minutes before taking your blood pressure. .  Don't smoke or drink caffeinated beverages for at least 30 minutes before. . Take your blood pressure before (not after) you eat. . Sit comfortably with your back supported and both feet on the floor (don't cross your legs). . Elevate your arm to heart level on a table or a desk. . Use the proper sized cuff. It should fit smoothly and snugly around your bare upper arm. There should be enough room to slip a fingertip under the cuff. The bottom edge of the cuff should be 1 inch above the crease of the elbow. . Ideally, take 3 measurements at one sitting and record the average.

## 2016-07-22 NOTE — Progress Notes (Signed)
Patient ID: [name]                 DOB: [birth year]                      MRN: [id number]     HPI: LAURENCE STELMACH is a 70 y.o. male patient of Dr. Gwenlyn Found with [place] [facility] who presents today for follow up of orthostatic hypotension. He saw Tana Coast PharmD about 3 weeks ago and was given midodrine 5 mg for prn use.   He continues to take carvedilol 3.125 mg twice daily and has used the midodrine just 3 times since he was here last.  He states there were 3 other occasions that he probably could have used it, but instead "rode them out".   In one of those instances he was leaving [employer] and did have a syncopal event.  EMS was not notified, the patient just got his bearings then went home.  He does not believe that he hit his head.   Today he notes some dizziness when he got to our lobby, lasting about a minute, then again when he arrived in the exam room.  Did orthostatic BP readings in the office today:  148/68  laying down 118/64  seated 92/50  standing  I then compared his home Omron cuff to the office reading.  This was a seated reading about 10 minutes later.   151/78  80 Omron 146/70  80 office reading   Cardiac Hx: HTN, DM, HLD, OSA, CVA, CHF with EF 30-35%  Current HTN meds:  Carvedilol 3.125mg  BID Furosemide 40mg  every other day  Previously tried:   Family History: He reports that his mother had hypertension.   Social History: He smoked a pipe in college but has remain off tobacco products since then. He denies alcohol.   Diet: He states he is somewhat frustrated over trying to find food that meet his strict sodium needs. He has spent hours looking for snacks that are low in sodium. He reports that just has made his own snacks with unsalted nuts, sunflower seeds, and dried cranberries. He has also started using more garlic and onion in place of salt. He love salsa and has found a good brand that is relatively low in sodium.   Exercise: walks regularly, likes to spend  an hour or more at [facility] just walking  Home BP readings: see above  Wt Readings from Last 3 Encounters:  07/19/16 236 lb 1.6 oz (107.1 kg)  06/16/16 236 lb (107 kg)  05/26/16 236 lb (107 kg)   BP Readings from Last 3 Encounters:  07/22/16 (!) 146/70  07/19/16 104/66  07/01/16 (!) 158/52   Pulse Readings from Last 3 Encounters:  07/19/16 82  07/01/16 81  06/16/16 82    Renal function: CrCl cannot be calculated (Patient's most recent lab result is older than the maximum 21 days allowed.).  Past Medical History:  Diagnosis Date  . Arthritis   . Cardiomyopathy- EF NL 01/2013, now 30-35% echo 03/14/2012  . Carotid artery occlusion    left s/p CEA  . Diabetes mellitus   . Hypertension   . Hypothyroidism   . Obstructive sleep apnea   . Peripheral arterial disease ([place]), s/p PTA x 2 RLE    nonhealing ulcers bilaterally on each great toe  . PONV (postoperative nausea and vomiting)   . Restless leg syndrome   . Shortness of breath   . Stroke [facility]) March 08, 2012    Current Outpatient Prescriptions on File Prior to Visit  Medication Sig Dispense Refill  . aspirin EC 81 MG EC tablet Take 1 tablet (81 mg total) by mouth daily.    [name] b complex vitamins tablet Take 1 tablet by mouth daily.    . carvedilol (COREG) 3.125 MG tablet Take 1 tablet (3.125 mg total) by mouth 2 (two) times daily with a meal. 60 tablet 11  . clopidogrel (PLAVIX) 75 MG tablet Take 75 mg by mouth daily.     . furosemide (LASIX) 40 MG tablet Take 1 tablet (40 mg total) by mouth 2 (two) times daily. (Patient taking differently: Take 40 mg by mouth daily. ) 60 tablet 3  . gabapentin (NEURONTIN) 600 MG tablet Take 600 mg by mouth 4 (four) times daily.     [name] glimepiride (AMARYL) 4 MG tablet Take 4 mg by mouth daily with breakfast.    . levothyroxine (SYNTHROID, LEVOTHROID) 25 MCG tablet Take 50 mcg by mouth daily.     . midodrine (PROAMATINE) 5 MG tablet Take 1 tablet (5 mg total) by mouth daily as needed. For  dizziness or low blood pressures. 30 tablet 1  . nitroGLYCERIN (NITROSTAT) 0.4 MG SL tablet Place 1 tablet (0.4 mg total) under the tongue every 5 (five) minutes as needed for chest pain. 25 tablet 3  . Omega 3-6-9 Fatty Acids (OMEGA 3-6-9 COMPLEX) CAPS Take 1 capsule by mouth 2 (two) times daily.    . potassium chloride SA (K-DUR,KLOR-CON) 20 MEQ tablet Take 1 tablet (20 mEq total) by mouth daily. 30 tablet 11  . pravastatin (PRAVACHOL) 40 MG tablet Take 40 mg by mouth every morning.     [name] rOPINIRole (REQUIP) 0.5 MG tablet Take 1 tablet (0.5 mg total) by mouth at bedtime. (Patient taking differently: Take 0.5 mg by mouth 2 (two) times daily. ) 30 tablet 11  . Thiamine Mononitrate (VITAMIN B1 PO) Take 1,000 mg by mouth 2 (two) times daily.     No current facility-administered medications on file prior to visit.     Allergies  Allergen Reactions  . Codeine Nausea And Vomiting  . Tramadol Nausea Only    Blood pressure (!) 146/70.   Assessment/Plan:  Patient with orthostatic hypotension, that could be neurogenic in nature.  He takes ropinirole daily for his RLS, and there is always the potential that the medication is making the [facility] more severe.  Before considering he stop the ropinirole, I would suggest that he actually increase his sodium intake.  He has spent the last few years trying to get his sodium intake as low as possible.  He tries to keep it below 2 gm/day.  I encouraged him to bring that up gradually, but keep it at no more than 3 gm/day.  I stressed that because of his CHF, we will need to watch daily weights and look for symptoms such as weight gain, SOB, LEE.  Will see him back in 6 weeks for follow up    Thank you, Lelan Pons. Patterson Hammersmith, [facility]  07/22/2016 2:53 PM

## 2016-07-22 NOTE — Assessment & Plan Note (Signed)
Patient with orthostatic hypotension, that could be neurogenic in nature.  He takes ropinirole daily for his RLS, and there is always the potential that the medication is making the [facility] more severe.  Before considering he stop the ropinirole, I would suggest that he actually increase his sodium intake.  He has spent the last few years trying to get his sodium intake as low as possible.  He tries to keep it below 2 gm/day.  I encouraged him to bring that up gradually, but keep it at no more than 3 gm/day.  I stressed that because of his CHF, we will need to watch daily weights and look for symptoms such as weight gain, SOB, LEE.  Will see him back in 6 weeks for follow up

## 2016-07-23 ENCOUNTER — Ambulatory Visit (HOSPITAL_COMMUNITY): Payer: PPO | Attending: Podiatry

## 2016-07-23 ENCOUNTER — Other Ambulatory Visit: Payer: Self-pay

## 2016-07-23 DIAGNOSIS — M6281 Muscle weakness (generalized): Secondary | ICD-10-CM | POA: Diagnosis not present

## 2016-07-23 DIAGNOSIS — R2681 Unsteadiness on feet: Secondary | ICD-10-CM | POA: Diagnosis not present

## 2016-07-23 DIAGNOSIS — R2689 Other abnormalities of gait and mobility: Secondary | ICD-10-CM | POA: Insufficient documentation

## 2016-07-23 DIAGNOSIS — R29898 Other symptoms and signs involving the musculoskeletal system: Secondary | ICD-10-CM | POA: Insufficient documentation

## 2016-07-23 NOTE — Therapy (Signed)
[place], [place], [address] Phone: [phone]   Fax:  [phone]  Physical Therapy Treatment  Patient Details  Name: [name] MRN: [id number] Date of Birth: 06/08/1946 Referring Provider: Erline Hau, DPM  Encounter Date: 07/23/2016      PT End of Session - 07/23/16 0955    Visit Number 9   Number of Visits 17   Date for PT Re-Evaluation 07/21/16   Authorization Type [employer]    Authorization Time Period 06/23/16 to 08/20/16   Authorization - Visit Number 9   Authorization - Number of Visits 10   PT Start Time 0908   PT Stop Time 0948   PT Time Calculation (min) 40 min   Equipment Utilized During Treatment Gait belt   Activity Tolerance Patient tolerated treatment well;No increased pain   Behavior During Therapy WFL for tasks assessed/performed      Past Medical History:  Diagnosis Date  . Arthritis   . Cardiomyopathy- EF NL 01/2013, now 30-35% echo 03/14/2012  . Carotid artery occlusion    left s/p CEA  . Diabetes mellitus   . Hypertension   . Hypothyroidism   . Obstructive sleep apnea   . Peripheral arterial disease ([place]), s/p PTA x 2 RLE    nonhealing ulcers bilaterally on each great toe  . PONV (postoperative nausea and vomiting)   . Restless leg syndrome   . Shortness of breath   . Stroke [facility]) March 08, 2012    Past Surgical History:  Procedure Laterality Date  . ANGIOPLASTY  02/28/14   diamond back orbital rotational atherectomy of Rt. tibial  . BACK SURGERY    . CARDIAC CATHETERIZATION N/A 05/19/2016   Procedure: Right/Left Heart Cath and Coronary Angiography;  Surgeon: Belva Crome, MD;  Location: [place] [facility];  Service: Cardiovascular;  Laterality: N/A;  . CERVICAL FUSION    . ENDARTERECTOMY Left 11/29/2013   Procedure: ENDARTERECTOMY CAROTID;  Surgeon: Serafina Mitchell, MD;  Location: [place];  Service: Vascular;  Laterality: Left;  . EYE SURGERY    . FOOT  SURGERY    . Lower ext duplex doppler  03/14/14   Rt ABI 1.2  . LOWER EXTREMITY ANGIOGRAM Bilateral 02/18/2014   Procedure: LOWER EXTREMITY ANGIOGRAM;  Surgeon: [name], MD;  Location: [facility] CATH LAB;  Service: Cardiovascular;  Laterality: Bilateral;  . LOWER EXTREMITY ANGIOGRAM N/A 10/31/2014   Procedure: LOWER EXTREMITY ANGIOGRAM;  Surgeon: [name], MD;  Location[facility] C[facility] LAB;  Service: Cardiovascular;  Laterality: N/A;  . PV angiogram  02/18/2014   tibial vessel diseas bil.  [name] SPINE SURGERY    . tendon achillies lengthing and sesamoid      There were no vitals filed for this visit.      Subjective Assessment - 07/23/16 0909    Subjective Pt stated he is feeling good today.  No reports of pain    Currently in Pain? No/denies            [facility] Adult PT Treatment/Exercise - 07/23/16 0001      Knee/Hip Exercises: Stretches   Active Hamstring Stretch Both;3 reps;30 seconds   Active Hamstring Stretch Limitations supine with rope     Knee/Hip Exercises: Standing   Heel Raises Both;10 reps   Heel Raises Limitations dorsiflexion x 10    Lateral Step Up Both;20 reps;Hand Hold: 2;Step Height: 6"   Functional Squat 15 reps   Functional Squat Limitations  mini squat    SLS Rt 4", Lt 19"   Other Standing Knee Exercises tandem stance 2x 30"   Other Standing Knee Exercises sidestep 2RT RTB     Knee/Hip Exercises: Seated   Sit to Sand 10 reps;without UE support  low mat height            PT Short Term Goals - 07/02/16 1431      PT SHORT TERM GOAL #1   Title Pt will demo consistency and independence with his HEP to improve balance and strength   Time 3   Period Weeks   Status On-going     PT SHORT TERM GOAL #2   Title Pt will demo improve ankle PF strength evideny by his ability to perform atleast 5 consecutive heel lifts on each LE, to improve his gait pattern.    Time 4   Period Weeks   Status On-going           PT Long Term Goals - 07/02/16 1432       PT LONG TERM GOAL #1   Title Pt will demo improved B hip strength to atleast 4+/5 MMT to increase his safety with functional tasks.    Time 8   Period Weeks   Status On-going     PT LONG TERM GOAL #2   Title Pt will perform SLS on each LE for up to 10 sec without LOB, 3/5 trials, to decrease risk of falling and injury.   Time 8   Period Weeks   Status On-going     PT LONG TERM GOAL #3   Title Pt will demo improved functional strength evident by his ability to perform 5x sit to stand in atleast 12 sec, without UE and crashing into the chair.    Time 8   Period Weeks   Status On-going     PT LONG TERM GOAL #4   Title Pt will perform TUG in less than 14 sec, with LRAD to represent a decreased is risk of falling in the community.   Time 8   Period Weeks   Status On-going     PT LONG TERM GOAL #5   Title Pt will demo improved hamstring flexibilty to lacking no more than 30 deg BLE, to improve his sitting and standing posture.    Time 8   Period Weeks   Status On-going     PT LONG TERM GOAL #6   Title Pt will demo improved gait mechanics evident by his ability to ambulate atleast 100 ft with noted heel off and minimal knee flexion throughout stance, using LRAD.    Time 8   Period Weeks   Status New               Plan - 07/23/16 1501    Clinical Impression Statement Session focus on improving functional strengthening and balance training.  Therapist facilitaito for safety and education on proper form and technique with therex and balance training today.  Cueing to increased BOS and knee extension with heel strike during gait today.  Pt limited by fatigue requiring occasional seated rest breaks.  No reports of dizziness through session.  Added SLS to POC this session, pt encouraged to begin at home in safe location at home wiht UE A available.     Rehab Potential Good   PT Frequency 2x / week   PT Duration 8 weeks   PT Treatment/Interventions ADLs/Self Care Home  Management;Gait training;Stair training;Functional mobility training;Therapeutic activities;Orthotic  Fit/Training;Therapeutic exercise;Patient/family education;Neuromuscular re-education;Balance training;Manual techniques;Passive range of motion   PT Next Visit Plan Reassess next session.  Continue with hamstring stretch, hip strength progression, ankle PF strengthening progression. Progress weights./reps as able.    PT Home Exercise Plan Eval: Ankle PF with red TB 2x15, bridge x20; 10/04: STS no HHA; 11/03 SLS and tandem stance      Patient will benefit from skilled therapeutic intervention in order to improve the following deficits and impairments:  Abnormal gait, Decreased activity tolerance, Decreased strength, Impaired flexibility, Pain, Postural dysfunction, Improper body mechanics, Difficulty walking, Decreased mobility, Decreased endurance, Decreased balance, Decreased range of motion  Visit Diagnosis: Muscle weakness (generalized)  Unsteadiness on feet  Other symptoms and signs involving the musculoskeletal system  Other abnormalities of gait and mobility     Problem List Patient Active Problem List   Diagnosis Date Noted  . Arrhythmia 05/20/2016  . Angina decubitus ([place]) 05/19/2016  . Congestive heart failure ([place]) 05/17/2016  . Pain in the chest 05/17/2016  . Pulmonary nodule 05/17/2016  . CAP (community acquired pneumonia) 04/11/2016  . Elevated troponin 04/11/2016  . Nonhealing skin ulcer ([place]) 10/28/2014  . Bilateral carotid artery disease ([place]) 07/08/2014  . Critical lower limb ischemia 02/28/2014  . Obstructive sleep apnea 02/12/2014  . Aftercare following surgery of the circulatory system, [place] 12/04/2013  . Restless leg syndrome 10/23/2013  . Obesity (BMI 30.0-34.9) 10/23/2013  . Hyperlipidemia 07/16/2013  . Peripheral arterial disease ([place]) 07/16/2013  . History of stroke June 2013 03/14/2012  . Hypertension 03/14/2012  . Type 2 diabetes mellitus with  circulatory disorder ([place]) 03/14/2012  . Peripheral neuropathy ([place]) 03/14/2012  . Cardiomyopathy- EF NL 01/2013, now 30-35% echo 03/14/2012  . ARTHRITIS, RIGHT FOOT 06/26/2008   [name], [place]; [place]  [name] 07/23/2016, 3:07 PM  [place], [place], [address] Phone: [phone]   Fax:  [phone]  Name: [name] MRN: [id number] Date of Birth: [birth year]

## 2016-07-23 NOTE — Patient Outreach (Signed)
[facility]) Care Management  [place]  07/23/2016   [name] 1945/11/29 [id number]  Subjective: Telephone call to patient initial assessment. Patient reports he has recently had some problems with his blood pressure dropping but he thinks things are under control now.  He saw the cardiologist on yesterday.  Patient reports his blood pressure today was 129/79.  Discussed with patient how blood pressure can change with position change. Patient reports that he was having problems when he went from sitting to standing or standing too long. Patient admits to diabetes as well and that his blood sugar ranges 100-110.  Patient reports that he is also doing outpatient therapy twice a week due to some gait issues and that therapy is going well.   Patient reports he drives himself to most his appointments if not his wife drives him.  Patient is independent with activities of daily living. Patient reports that his heart failure is a relatively new diagnosis for him but he weighs daily and watches his diet.  Discussed with patient heart failure zone chart and when to notify physician.  He verbalized understanding.   Objective:   Encounter Medications:  Outpatient Encounter Prescriptions as of 07/23/2016  Medication Sig Note  . aspirin EC 81 MG EC tablet Take 1 tablet (81 mg total) by mouth daily.   [name] b complex vitamins tablet Take 1 tablet by mouth daily.   . carvedilol (COREG) 3.125 MG tablet Take 1 tablet (3.125 mg total) by mouth 2 (two) times daily with a meal.   . clopidogrel (PLAVIX) 75 MG tablet Take 75 mg by mouth daily.  11/23/2013: .  . furosemide (LASIX) 40 MG tablet Take 1 tablet (40 mg total) by mouth 2 (two) times daily. (Patient taking differently: Take 40 mg by mouth daily. )   . gabapentin (NEURONTIN) 600 MG tablet Take 600 mg by mouth 4 (four) times daily.    [name] glimepiride (AMARYL) 4 MG tablet Take 4 mg by mouth daily with breakfast.   . levothyroxine  (SYNTHROID, LEVOTHROID) 25 MCG tablet Take 50 mcg by mouth daily.  11/23/2013: .  . midodrine (PROAMATINE) 5 MG tablet Take 1 tablet (5 mg total) by mouth daily as needed. For dizziness or low blood pressures.   . nitroGLYCERIN (NITROSTAT) 0.4 MG SL tablet Place 1 tablet (0.4 mg total) under the tongue every 5 (five) minutes as needed for chest pain.   . Omega 3-6-9 Fatty Acids (OMEGA 3-6-9 COMPLEX) CAPS Take 1 capsule by mouth 2 (two) times daily.   . potassium chloride SA (K-DUR,KLOR-CON) 20 MEQ tablet Take 1 tablet (20 mEq total) by mouth daily.   . pravastatin (PRAVACHOL) 40 MG tablet Take 40 mg by mouth every morning.    [name] rOPINIRole (REQUIP) 0.5 MG tablet Take 1 tablet (0.5 mg total) by mouth at bedtime. (Patient taking differently: Take 0.5 mg by mouth 2 (two) times daily. )   . Thiamine Mononitrate (VITAMIN B1 PO) Take 1,000 mg by mouth 2 (two) times daily.    No facility-administered encounter medications on file as of 07/23/2016.     Functional Status:  In your present state of health, do you have any difficulty performing the following activities: 07/23/2016 05/17/2016  Hearing? [place]? N -  Difficulty concentrating or making decisions? N -  Walking or climbing stairs? N -  Dressing or bathing? N -  Doing errands, shopping? N N  Preparing Food and eating ? N -  Using  the Toilet? N -  In the past six months, have you accidently leaked urine? N -  Do you have problems with loss of bowel control? N -  Managing your Medications? N -  Managing your Finances? N -  Housekeeping or managing your Housekeeping? N -  Some recent data might be hidden    Fall/Depression Screening: PHQ 2/9 Scores 07/23/2016 06/29/2016  PHQ - 2 Score 0 0    Assessment: Patient will benefit from health coach outreach for education and support for heart failure management.  Plan:  [facility] CM Care Plan Problem One   Flowsheet Row Most Recent Value  Care Plan Problem One  Knowledge Deficit Heart Failure as  evidenced by recent diagnosis  Role Documenting the Problem One  [place] for Problem One  Active  THN Long Term Goal (31-90 days)  Patient will be able to identify heart failure zones within 90 days.   THN Long Term Goal Start Date  07/23/16  Interventions for Problem One Long Term Goal  RN Health Coach discussed heart failure zone chart and purpose.   THN CM Short Term Goal #1 (0-30 days)  Patient will be able to verbalize symptoms of heart failure red zone within 30 days.  THN CM Short Term Goal #1 Start Date  07/23/16  Interventions for Short Term Goal #1  RN Health Coach explained to patient green zone of heart failure zone chart.   THN CM Short Term Goal #2 (0-30 days)  Patient will be able to name three foods that are high in sodium within 30 days.  THN CM Short Term Goal #2 Start Date  07/23/16  Interventions for Short Term Goal #2  [place] discussed with patient how frozen/fresh/and canned foods are different in sodium content.     RN Health Coach will provide ongoing education for patient on heart failure through phone calls and sending printed information to patient for further discussion.  RN Health Coach will send welcome packet with consent to patient as well as printed information on heart failure.  RN Health Coach will send initial barriers letter, assessment, and care plan to primary care physician.  RN Health Coach will contact patient within one month and patient agrees to next contact.   Jone Baseman, RN, MSN [place] [phone]

## 2016-07-26 ENCOUNTER — Ambulatory Visit (HOSPITAL_COMMUNITY): Payer: PPO | Admitting: Physical Therapy

## 2016-07-26 DIAGNOSIS — R2681 Unsteadiness on feet: Secondary | ICD-10-CM

## 2016-07-26 DIAGNOSIS — M6281 Muscle weakness (generalized): Secondary | ICD-10-CM | POA: Diagnosis not present

## 2016-07-26 DIAGNOSIS — R29898 Other symptoms and signs involving the musculoskeletal system: Secondary | ICD-10-CM

## 2016-07-26 DIAGNOSIS — R2689 Other abnormalities of gait and mobility: Secondary | ICD-10-CM

## 2016-07-26 NOTE — Patient Instructions (Addendum)
Heel Raise: Bilateral (Standing)   At kitchen counter  Rise on balls of feet. Repeat __10-15__ times per set. Do ____ sets per session. Do _2___ sessions per day. 1 http://orth.exer.us/38   Copyright  VHI. All rights reserved.  Toe Raise (Standing)   At [employer] back on heels. Repeat 10-15____ times per set. Do 1____ sets per session. Do _2___ sessions per day.  http://orth.exer.us/42   Copyright  VHI. All rights reserved.  Balance: Unilateral   At kitchen counter  Attempt to balance on left leg, eyes open. Hold __as long as possible__ seconds. Repeat __5__ times per set. Do ___1_ sets per session. Do __2__ sessions per day. Perform exercise with eyes closed.  http://orth.exer.us/28   Copyright  VHI. All rights reserved.  Strengthening: Hip Abduction (Side-Lying)   With 3 # weight on leg  Tighten muscles on front of left thigh, then lift leg __12__ inches from surface, keeping knee locked. 2 Repeat __10-15__ times per set. Do _1___ sets per session. Do ____ sessions per day.  http://orth.exer.us/622   Copyright  VHI. All rights reserved.

## 2016-07-26 NOTE — Therapy (Signed)
[place], [place], [address] Phone: [phone]   Fax:  [phone]  Physical Therapy Treatment  Patient Details  Name: [name] MRN: [id number] Date of Birth: 1945/12/19 Referring Provider: Erline Hau   Encounter Date: 07/26/2016      PT End of Session - 07/26/16 0909    Visit Number 10   Number of Visits 17   Date for PT Re-Evaluation 07/21/16   Authorization Type [employer]    Authorization Time Period 06/23/16 to 08/20/16   Authorization - Visit Number 10   Authorization - Number of Visits 17   PT Start Time 0845   PT Stop Time 0928   PT Time Calculation (min) 43 min   Equipment Utilized During Treatment Gait belt   Activity Tolerance Patient tolerated treatment well;No increased pain   Behavior During Therapy WFL for tasks assessed/performed      Past Medical History:  Diagnosis Date  . Arthritis   . Cardiomyopathy- EF NL 01/2013, now 30-35% echo 03/14/2012  . Carotid artery occlusion    left s/p CEA  . Diabetes mellitus   . Hypertension   . Hypothyroidism   . Obstructive sleep apnea   . Peripheral arterial disease ([place]), s/p PTA x 2 RLE    nonhealing ulcers bilaterally on each great toe  . PONV (postoperative nausea and vomiting)   . Restless leg syndrome   . Shortness of breath   . Stroke [facility]) March 08, 2012    Past Surgical History:  Procedure Laterality Date  . ANGIOPLASTY  02/28/14   diamond back orbital rotational atherectomy of Rt. tibial  . BACK SURGERY    . CARDIAC CATHETERIZATION N/A 05/19/2016   Procedure: Right/Left Heart Cath and Coronary Angiography;  Surgeon: Belva Crome, MD;  Location: [facility] CV LAB;  Service: Cardiovascular;  Laterality: N/A;  . CERVICAL FUSION    . ENDARTERECTOMY Left 11/29/2013   Procedure: ENDARTERECTOMY CAROTID;  Surgeon: Serafina Mitchell, MD;  Location: [facility];  Service: Vascular;  Laterality: Left;  . EYE SURGERY    . FOOT  SURGERY    . Lower ext duplex doppler  03/14/14   Rt ABI 1.2  . LOWER EXTREMITY ANGIOGRAM Bilateral 02/18/2014   Procedure: LOWER EXTREMITY ANGIOGRAM;  Surgeon: [name], MD;  Location: [facility] CATH LAB;  Service: Cardiovascular;  Laterality: Bilateral;  . LOWER EXTREMITY ANGIOGRAM N/A 10/31/2014   Procedure: LOWER EXTREMITY ANGIOGRAM;  Surgeon: [name], MD;  Location: [facility] CATH LAB;  Service: Cardiovascular;  Laterality: N/A;  . PV angiogram  02/18/2014   tibial vessel diseas bil.  [name] SPINE SURGERY    . tendon achillies lengthing and sesamoid      There were no vitals filed for this visit.      Subjective Assessment - 07/26/16 0845    Subjective Pt states that he is doing his HEP twice a day.   Since starting therapy going from sit to stand and stand to sit is easier.  He is still having balance issues.  Overall he feels stronger.     How long can you sit comfortably? no Problem   How long can you stand comfortably? hours was 30 minutes    How long can you walk comfortably?  Pt uses a cane and can walk for 30 minutes at a time was 5 minutes    Patient Stated Goals better balance    Currently in Pain? No/denies  [facility] PT Assessment - 07/26/16 0001      Assessment   Medical Diagnosis Bilateral LE weakness    Referring Provider Timothy Vogler    Onset Date/Surgical Date 04/09/16   Next MD Visit 06/24/16   Prior Therapy [facility]PPT for wounds      Precautions   Precautions None     Balance Screen   Has the patient fallen in the past 6 months Yes  due to blood pressure problems; MD is working on this    How many times? 3   Has the patient had a decrease in activity level because of a fear of falling?  No   Is the patient reluctant to leave their home because of a fear of falling?  No     Prior Function   Level of Independence Independent     Cognition   Overall Cognitive Status Within Functional Limits for tasks assessed     Sensation   Light Touch Appears  Intact   Additional Comments Pt with decreased sensation from peripheral neuropathy in B feet      Functional Tests   Functional tests Single leg stance     Single Leg Stance   Comments Rt:  2   ,  LT:  12     Strength   Right Hip Flexion 5/5   Right Hip Extension 4+/5  was 3+   Right Hip ABduction 3+/5  was 3+/5    Left Hip Flexion 5/5   Left Hip Extension 4+/5  was 4/5   Left Hip ABduction 4/5  was 4-/5    Right Knee Flexion 5/5   Right Knee Extension 5/5   Left Knee Flexion 5/5   Left Knee Extension 5/5   Right Ankle Dorsiflexion 5/5   Right Ankle Plantar Flexion 2/5  was 2/5    Left Ankle Dorsiflexion 5/5   Left Ankle Plantar Flexion 2+/5  was 2+/5     Flexibility   Soft Tissue Assessment /Muscle Length yes  Rt soleus: 20 deg, Gastroc: 12 deg DF   Hamstrings Rt: 50 deg, Lt: 45 deg, lacking   REas lacking 60 degrees on Rt  and 55 on left    Piriformis 50% limited      Palpation   Palpation comment TTP Rt TFL/hip flexor     Transfers   Five time sit to stand comments   no UE 14.71 was 12.6 but stood all the way up.      Ambulation/Gait   Ambulation/Gait Yes   Ambulation/Gait Assistance 6: Modified independent (Device/Increase time)   Ambulation Distance (Feet) 50 Feet   Assistive device Straight cane   Gait Pattern Decreased step length - left;Decreased step length - right;Decreased stride length;Right flexed knee in stance;Left flexed knee in stance;Left foot flat;Right foot flat;Trendelenburg     Standardized Balance Assessment   Standardized Balance Assessment Timed Up and Go Test     Timed Up and Go Test   TUG Comments  14.3 seconds was 23.8 sec using SPC                     [facility] Adult PT Treatment/Exercise - 07/26/16 0001      Knee/Hip Exercises: Stretches   Active Hamstring Stretch Both;3 reps;30 seconds     Knee/Hip Exercises: Standing   Heel Raises Both;10 reps   SLS B x 5 each      Knee/Hip Exercises: Supine   Bridges 10  reps     Knee/Hip Exercises:  Sidelying   Hip ABduction Strengthening;Both;10 reps   Hip ABduction Limitations 3#     sit to stand x 10;             PT Education - 07/26/16 0938    Education provided Yes   Education Details Hep    Person(s) Educated Patient   Methods Explanation   Comprehension Verbalized understanding          PT Short Term Goals - 07/26/16 0910      PT SHORT TERM GOAL #1   Title Pt will demo consistency and independence with his HEP to improve balance and strength   Time 3   Period Weeks   Status Achieved     PT SHORT TERM GOAL #2   Title Pt will demo improve ankle PF strength evideny by his ability to perform atleast 5 consecutive heel lifts on each LE, to improve his gait pattern.    Time 4   Period Weeks   Status On-going           PT Long Term Goals - 07/26/16 0911      PT LONG TERM GOAL #1   Title Pt will demo improved B hip strength to atleast 4+/5 MMT to increase his safety with functional tasks.    Time 8   Period Weeks   Status Partially Met     PT LONG TERM GOAL #2   Title Pt will perform SLS on each LE for up to 10 sec without LOB, 3/5 trials, to decrease risk of falling and injury.   Time 8   Period Weeks   Status On-going     PT LONG TERM GOAL #3   Title Pt will demo improved functional strength evident by his ability to perform 5x sit to stand in atleast 12 sec, without UE and crashing into the chair.    Time 8   Period Weeks   Status On-going     PT LONG TERM GOAL #4   Title Pt will perform TUG in less than 14 sec, with LRAD to represent a decreased is risk of falling in the community.   Time 8   Period Weeks   Status On-going     PT LONG TERM GOAL #5   Title Pt will demo improved hamstring flexibilty to lacking no more than 30 deg BLE, to improve his sitting and standing posture.    Time 8   Period Weeks   Status On-going     PT LONG TERM GOAL #6   Title Pt will demo improved gait mechanics evident by  his ability to ambulate atleast 100 ft with noted heel off and minimal knee flexion throughout stance, using LRAD.    Time 8   Period Weeks   Status On-going               Plan - 07/26/16 0941    Clinical Impression Statement Mr. Gildersleeve was reassessed today with noted improvement in gluteal maximus strength, TUG score and hamstring length.  He still has limitations in his gait and balance and will contiue to benefit from skilled physical therapy to address his current limitations to improve his gait and decrease his fall risk    Rehab Potential Good   PT Frequency 2x / week   PT Duration 8 weeks   PT Treatment/Interventions ADLs/Self Care Home Management;Gait training;Stair training;Functional mobility training;Therapeutic activities;Orthotic Fit/Training;Therapeutic exercise;Patient/family education;Neuromuscular re-education;Balance training;Manual techniques;Passive range of motion   PT Next Visit Plan  Continue with hamstring stretch, hip abduction strength progression, ankle PF strengthening with slow heel toe gt at parallel bars. Progress weights./reps as able.    PT Home Exercise Plan Eval: Ankle PF with red TB 2x15, bridge x20; 10/04: STS no HHA; 11/03 SLS and tandem stance; heel raise, toe raise, hip abduction       Patient will benefit from skilled therapeutic intervention in order to improve the following deficits and impairments:  Abnormal gait, Decreased activity tolerance, Decreased strength, Impaired flexibility, Pain, Postural dysfunction, Improper body mechanics, Difficulty walking, Decreased mobility, Decreased endurance, Decreased balance, Decreased range of motion  Visit Diagnosis: Muscle weakness (generalized)  Unsteadiness on feet  Other symptoms and signs involving the musculoskeletal system  Other abnormalities of gait and mobility       G-Codes - 08/11/2016 0944    Functional Limitation Mobility: Walking and moving around   Mobility: Walking and  Moving Around Current Status [phone]) At least 20 percent but less than 40 percent impaired, limited or restricted   Mobility: Walking and Moving Around Goal Status [phone]) At least 20 percent but less than 40 percent impaired, limited or restricted      Problem List Patient Active Problem List   Diagnosis Date Noted  . Arrhythmia 05/20/2016  . Angina decubitus ([place]) 05/19/2016  . Congestive heart failure ([place]) 05/17/2016  . Pain in the chest 05/17/2016  . Pulmonary nodule 05/17/2016  . CAP (community acquired pneumonia) 04/11/2016  . Elevated troponin 04/11/2016  . Nonhealing skin ulcer ([place]) 10/28/2014  . Bilateral carotid artery disease ([place]) 07/08/2014  . Critical lower limb ischemia 02/28/2014  . Obstructive sleep apnea 02/12/2014  . Aftercare following surgery of the circulatory system, [place] 12/04/2013  . Restless leg syndrome 10/23/2013  . Obesity (BMI 30.0-34.9) 10/23/2013  . Hyperlipidemia 07/16/2013  . Peripheral arterial disease ([place]) 07/16/2013  . History of stroke June 2013 03/14/2012  . Hypertension 03/14/2012  . Type 2 diabetes mellitus with circulatory disorder ([place]) 03/14/2012  . Peripheral neuropathy ([place]) 03/14/2012  . Cardiomyopathy- EF NL 01/2013, now 30-35% echo 03/14/2012  . ARTHRITIS, RIGHT FOOT 06/26/2008    Rayetta Humphrey, PT CLT [id number] 08-11-2016, 9:45 AM  [place], [place], [address] Phone: [phone]   Fax:  [phone]  Name: [name] MRN: [id number] Date of Birth: [birth year]

## 2016-07-29 ENCOUNTER — Encounter: Payer: Self-pay | Admitting: Pharmacist Clinician (PhC)/ Clinical Pharmacy Specialist

## 2016-07-29 DIAGNOSIS — E1140 Type 2 diabetes mellitus with diabetic neuropathy, unspecified: Secondary | ICD-10-CM | POA: Diagnosis not present

## 2016-07-29 DIAGNOSIS — E11610 Type 2 diabetes mellitus with diabetic neuropathic arthropathy: Secondary | ICD-10-CM | POA: Diagnosis not present

## 2016-07-30 ENCOUNTER — Ambulatory Visit (HOSPITAL_COMMUNITY): Payer: PPO | Admitting: Physical Therapy

## 2016-07-30 ENCOUNTER — Telehealth: Payer: Self-pay | Admitting: Pharmacist

## 2016-07-30 DIAGNOSIS — R29898 Other symptoms and signs involving the musculoskeletal system: Secondary | ICD-10-CM

## 2016-07-30 DIAGNOSIS — M6281 Muscle weakness (generalized): Secondary | ICD-10-CM | POA: Diagnosis not present

## 2016-07-30 DIAGNOSIS — R2689 Other abnormalities of gait and mobility: Secondary | ICD-10-CM

## 2016-07-30 DIAGNOSIS — R2681 Unsteadiness on feet: Secondary | ICD-10-CM

## 2016-07-30 NOTE — Telephone Encounter (Signed)
Spoke with patient and he reports that he has been having trouble with blood pressures again. He passed out over the weekend and pressure was 88/50.   Since his visit with Erasmo Downer he has used the midodrine pills several times, including 3 times in the last week. He reports he is trying not to abuse them.  He has also increased his sodium intake. This does not appear to be helping. He does state that he is not retaining fluid though.   Today he reports pressures as following:   7th before breakfast 162/91;  133/66 after; 119/65- dinner 8th "not great" 9th "not great"  Today before breakfast130/72 after 90/50 -  Took 4 pills; before lunch 130/63 and after 142/63  He still struggles with pressures mostly after breakfast.   Will have him take 1 tablet (midodrine) every morning with breakfast to see if this will help control his symptoms. He will call to report in about 2 weeks unless he has additional issues between now and then.  Discussed ropinirole and orthostatics. He states this is the only thing that has helped with neuropathy, but he will speak to doc about change at next visit.   He states he understands and appreciates assistance.

## 2016-07-30 NOTE — Therapy (Signed)
[place], [place], [address] Phone: [phone]   Fax:  [phone]  Physical Therapy Treatment  Patient Details  Name: [name] MRN: [id number] Date of Birth: [birth year] Referring Provider: Erline Hau   Encounter Date: 07/30/2016      PT End of Session - 07/30/16 0903    Visit Number 11   Number of Visits 17   Date for PT Re-Evaluation 07/21/16   Authorization Type [employer]    Authorization Time Period 06/23/16 to 08/20/16   Authorization - Visit Number 11   Authorization - Number of Visits 17   PT Start Time 0815   PT Stop Time 0902   PT Time Calculation (min) 47 min   Equipment Utilized During Treatment Gait belt   Activity Tolerance Patient tolerated treatment well;No increased pain   Behavior During Therapy WFL for tasks assessed/performed      Past Medical History:  Diagnosis Date  . Arthritis   . Cardiomyopathy- EF NL 01/2013, now 30-35% echo 03/14/2012  . Carotid artery occlusion    left s/p CEA  . Diabetes mellitus   . Hypertension   . Hypothyroidism   . Obstructive sleep apnea   . Peripheral arterial disease ([place]), s/p PTA x 2 RLE    nonhealing ulcers bilaterally on each great toe  . PONV (postoperative nausea and vomiting)   . Restless leg syndrome   . Shortness of breath   . Stroke [facility]) March 08, 2012    Past Surgical History:  Procedure Laterality Date  . ANGIOPLASTY  02/28/14   diamond back orbital rotational atherectomy of Rt. tibial  . BACK SURGERY    . CARDIAC CATHETERIZATION N/A 05/19/2016   Procedure: Right/Left Heart Cath and Coronary Angiography;  Surgeon: Belva Crome, MD;  Location: [facility];  Service: Cardiovascular;  Laterality: N/A;  . CERVICAL FUSION    . ENDARTERECTOMY Left 11/29/2013   Procedure: ENDARTERECTOMY CAROTID;  Surgeon: Serafina Mitchell, MD;  Location: [facility];  Service: Vascular;  Laterality: Left;  . EYE SURGERY    . FOOT  SURGERY    . Lower ext duplex doppler  03/14/14   Rt ABI 1.2  . LOWER EXTREMITY ANGIOGRAM Bilateral 02/18/2014   Procedure: LOWER EXTREMITY ANGIOGRAM;  Surgeon: [name], MD;  Location: [facility] CATH LAB;  Service: Cardiovascular;  Laterality: Bilateral;  . LOWER EXTREMITY ANGIOGRAM N/A 10/31/2014   Procedure: LOWER EXTREMITY ANGIOGRAM;  Surgeon: [name], MD;  Location: [facility] CATH LAB;  Service: Cardiovascular;  Laterality: N/A;  . PV angiogram  02/18/2014   tibial vessel diseas bil.  [name] SPINE SURGERY    . tendon achillies lengthing and sesamoid      There were no vitals filed for this visit.      Subjective Assessment - 07/30/16 0840    Subjective Pt states that he does not feel right.  B/P taken 90/50;  Pt took medication 10 minutes later 100/60.  Took second dose of medication    How long can you sit comfortably? no Problem   How long can you stand comfortably? hours was 30 minutes    How long can you walk comfortably?  Pt uses a cane and can walk for 30 minutes at a time was 5 minutes    Patient Stated Goals better balance    Currently in Pain? No/denies  [facility] Adult PT Treatment/Exercise - 07/30/16 0001      Knee/Hip Exercises: Stretches   Active Hamstring Stretch Both;2 reps;60 seconds     Knee/Hip Exercises: Seated   Other Seated Knee/Hip Exercises scapular retraction x 10; pushing feet into floor to lift buttock x 10    Abduction/Adduction  Both;10 reps   Abd/Adduction Limitations isometric      Knee/Hip Exercises: Supine   Bridges 20 reps   Other Supine Knee/Hip Exercises ankle dorisflexion/plantarflexion x 15      Knee/Hip Exercises: Sidelying   Hip ABduction Strengthening;Both;15 reps                  PT Short Term Goals - 07/26/16 0910      PT SHORT TERM GOAL #1   Title Pt will demo consistency and independence with his HEP to improve balance and strength   Time 3   Period Weeks   Status Achieved      PT SHORT TERM GOAL #2   Title Pt will demo improve ankle PF strength evideny by his ability to perform atleast 5 consecutive heel lifts on each LE, to improve his gait pattern.    Time 4   Period Weeks   Status On-going           PT Long Term Goals - 07/26/16 0911      PT LONG TERM GOAL #1   Title Pt will demo improved B hip strength to atleast 4+/5 MMT to increase his safety with functional tasks.    Time 8   Period Weeks   Status Partially Met     PT LONG TERM GOAL #2   Title Pt will perform SLS on each LE for up to 10 sec without LOB, 3/5 trials, to decrease risk of falling and injury.   Time 8   Period Weeks   Status On-going     PT LONG TERM GOAL #3   Title Pt will demo improved functional strength evident by his ability to perform 5x sit to stand in atleast 12 sec, without UE and crashing into the chair.    Time 8   Period Weeks   Status On-going     PT LONG TERM GOAL #4   Title Pt will perform TUG in less than 14 sec, with LRAD to represent a decreased is risk of falling in the community.   Time 8   Period Weeks   Status On-going     PT LONG TERM GOAL #5   Title Pt will demo improved hamstring flexibilty to lacking no more than 30 deg BLE, to improve his sitting and standing posture.    Time 8   Period Weeks   Status On-going     PT LONG TERM GOAL #6   Title Pt will demo improved gait mechanics evident by his ability to ambulate atleast 100 ft with noted heel off and minimal knee flexion throughout stance, using LRAD.    Time 8   Period Weeks   Status On-going               Plan - 07/30/16 0904    Clinical Impression Statement Pt comes in feeling as if his blood pressure was low.  Therapist took BP=90/50; pt took 2 BP pills increased to 100/60; took two more increased to 110/65.  Pt session focused on non weight bearing strengthening due to pt not feeling well.     Rehab Potential Good   PT Frequency 2x / week  PT Duration 8 weeks   PT  Treatment/Interventions ADLs/Self Care Home Management;Gait training;Stair training;Functional mobility training;Therapeutic activities;Orthotic Fit/Training;Therapeutic exercise;Patient/family education;Neuromuscular re-education;Balance training;Manual techniques;Passive range of motion   PT Next Visit Plan   Continue with hamstring stretch, hip abduction strength progression, ankle PF strengthening with slow heel toe gt at parallel bars. Progress weights./reps as able.    PT Home Exercise Plan Eval: Ankle PF with red TB 2x15, bridge x20; 10/04: STS no HHA; 11/03 SLS and tandem stance; heel raise, toe raise, hip abduction       Patient will benefit from skilled therapeutic intervention in order to improve the following deficits and impairments:  Abnormal gait, Decreased activity tolerance, Decreased strength, Impaired flexibility, Pain, Postural dysfunction, Improper body mechanics, Difficulty walking, Decreased mobility, Decreased endurance, Decreased balance, Decreased range of motion  Visit Diagnosis: Muscle weakness (generalized)  Unsteadiness on feet  Other symptoms and signs involving the musculoskeletal system  Other abnormalities of gait and mobility     Problem List Patient Active Problem List   Diagnosis Date Noted  . Arrhythmia 05/20/2016  . Angina decubitus ([place]) 05/19/2016  . Congestive heart failure ([place]) 05/17/2016  . Pain in the chest 05/17/2016  . Pulmonary nodule 05/17/2016  . CAP (community acquired pneumonia) 04/11/2016  . Elevated troponin 04/11/2016  . Nonhealing skin ulcer ([place]) 10/28/2014  . Bilateral carotid artery disease ([place]) 07/08/2014  . Critical lower limb ischemia 02/28/2014  . Obstructive sleep apnea 02/12/2014  . Aftercare following surgery of the circulatory system, [place] 12/04/2013  . Restless leg syndrome 10/23/2013  . Obesity (BMI 30.0-34.9) 10/23/2013  . Hyperlipidemia 07/16/2013  . Peripheral arterial disease ([place]) 07/16/2013  . History  of stroke June 2013 03/14/2012  . Hypertension 03/14/2012  . Type 2 diabetes mellitus with circulatory disorder ([place]) 03/14/2012  . Peripheral neuropathy ([place]) 03/14/2012  . Cardiomyopathy- EF NL 01/2013, now 30-35% echo 03/14/2012  . ARTHRITIS, RIGHT FOOT 06/26/2008    Rayetta Humphrey, PT CLT [phone] 07/30/2016, 9:06 AM  [place], [place], [address] Phone: [phone]   Fax:  [phone]  Name: CEBASTIAN NEIS MRN: [id number] Date of Birth: [birth year]

## 2016-08-02 ENCOUNTER — Ambulatory Visit (HOSPITAL_COMMUNITY): Payer: PPO | Admitting: Physical Therapy

## 2016-08-02 DIAGNOSIS — R29898 Other symptoms and signs involving the musculoskeletal system: Secondary | ICD-10-CM

## 2016-08-02 DIAGNOSIS — R2689 Other abnormalities of gait and mobility: Secondary | ICD-10-CM

## 2016-08-02 DIAGNOSIS — M6281 Muscle weakness (generalized): Secondary | ICD-10-CM

## 2016-08-02 DIAGNOSIS — R2681 Unsteadiness on feet: Secondary | ICD-10-CM

## 2016-08-02 NOTE — Therapy (Signed)
[place], [place], [address] Phone: [phone]   Fax:  [phone]  Physical Therapy Treatment  Patient Details  Name: [name] MRN: [id number] Date of Birth: [birth year] Referring Provider: Erline Hau   Encounter Date: 08/02/2016      PT End of Session - 08/02/16 0854    Visit Number 12   Number of Visits 17   Date for PT Re-Evaluation 07/21/16   Authorization Type [employer]    Authorization Time Period 06/23/16 to 08/20/16   Authorization - Visit Number 12   Authorization - Number of Visits 17   PT Start Time 0820   PT Stop Time 0900   PT Time Calculation (min) 40 min   Equipment Utilized During Treatment Gait belt   Activity Tolerance Patient tolerated treatment well;No increased pain   Behavior During Therapy WFL for tasks assessed/performed      Past Medical History:  Diagnosis Date  . Arthritis   . Cardiomyopathy- EF NL 01/2013, now 30-35% echo 03/14/2012  . Carotid artery occlusion    left s/p CEA  . Diabetes mellitus   . Hypertension   . Hypothyroidism   . Obstructive sleep apnea   . Peripheral arterial disease ([place]), s/p PTA x 2 RLE    nonhealing ulcers bilaterally on each great toe  . PONV (postoperative nausea and vomiting)   . Restless leg syndrome   . Shortness of breath   . Stroke [facility]) March 08, 2012    Past Surgical History:  Procedure Laterality Date  . ANGIOPLASTY  02/28/14   diamond back orbital rotational atherectomy of Rt. tibial  . BACK SURGERY    . CARDIAC CATHETERIZATION N/A 05/19/2016   Procedure: Right/Left Heart Cath and Coronary Angiography;  Surgeon: Belva Crome, MD;  Location: [facility];  Service: Cardiovascular;  Laterality: N/A;  . CERVICAL FUSION    . ENDARTERECTOMY Left 11/29/2013   Procedure: ENDARTERECTOMY CAROTID;  Surgeon: Serafina Mitchell, MD;  Location: [facility];  Service: Vascular;  Laterality: Left;  . EYE SURGERY    . FOOT  SURGERY    . Lower ext duplex doppler  03/14/14   Rt ABI 1.2  . LOWER EXTREMITY ANGIOGRAM Bilateral 02/18/2014   Procedure: LOWER EXTREMITY ANGIOGRAM;  Surgeon: [name], MD;  Location: [facility] CATH LAB;  Service: Cardiovascular;  Laterality: Bilateral;  . LOWER EXTREMITY ANGIOGRAM N/A 10/31/2014   Procedure: LOWER EXTREMITY ANGIOGRAM;  Surgeon: [name], MD;  Location: [facility] CATH LAB;  Service: Cardiovascular;  Laterality: N/A;  . PV angiogram  02/18/2014   tibial vessel diseas bil.  [name] SPINE SURGERY    . tendon achillies lengthing and sesamoid      There were no vitals filed for this visit.      Subjective Assessment - 08/02/16 0821    Subjective Pt states that he was walking this weekend and his knee, "popped" on the anterior aspect and he has been having increased pain ever since.     Currently in Pain? Yes   Pain Score 6    Pain Location Knee   Pain Orientation Left   Pain Descriptors / Indicators Throbbing;Aching   Aggravating Factors  walking    Pain Relieving Factors resting    Effect of Pain on Daily Activities increases                          [facility] Adult  PT Treatment/Exercise - 08/02/16 0001      Knee/Hip Exercises: Stretches   Active Hamstring Stretch Both;2 reps;60 seconds   Quad Stretch Both;1 rep;60 seconds   Hip Flexor Stretch Both;1 rep;Limitations   Hip Flexor Stretch Limitations 1:30      Knee/Hip Exercises: Standing   Heel Raises --     Knee/Hip Exercises: Seated   Other Seated Knee/Hip Exercises dorsiflexion/plantarflexion  10      Knee/Hip Exercises: Supine   Bridges 20 reps     Knee/Hip Exercises: Sidelying   Hip ABduction Strengthening;Both;15 reps     Knee/Hip Exercises: Prone   Hamstring Curl 15 reps   Hamstring Curl Limitations B 4#   Hip Extension Strengthening;Both;15 reps   Hip Extension Limitations Rt 4# Lt 0#                   PT Short Term Goals - 07/26/16 0910      PT SHORT TERM GOAL #1   Title  Pt will demo consistency and independence with his HEP to improve balance and strength   Time 3   Period Weeks   Status Achieved     PT SHORT TERM GOAL #2   Title Pt will demo improve ankle PF strength evideny by his ability to perform atleast 5 consecutive heel lifts on each LE, to improve his gait pattern.    Time 4   Period Weeks   Status On-going           PT Long Term Goals - 07/26/16 0911      PT LONG TERM GOAL #1   Title Pt will demo improved B hip strength to atleast 4+/5 MMT to increase his safety with functional tasks.    Time 8   Period Weeks   Status Partially Met     PT LONG TERM GOAL #2   Title Pt will perform SLS on each LE for up to 10 sec without LOB, 3/5 trials, to decrease risk of falling and injury.   Time 8   Period Weeks   Status On-going     PT LONG TERM GOAL #3   Title Pt will demo improved functional strength evident by his ability to perform 5x sit to stand in atleast 12 sec, without UE and crashing into the chair.    Time 8   Period Weeks   Status On-going     PT LONG TERM GOAL #4   Title Pt will perform TUG in less than 14 sec, with LRAD to represent a decreased is risk of falling in the community.   Time 8   Period Weeks   Status On-going     PT LONG TERM GOAL #5   Title Pt will demo improved hamstring flexibilty to lacking no more than 30 deg BLE, to improve his sitting and standing posture.    Time 8   Period Weeks   Status On-going     PT LONG TERM GOAL #6   Title Pt will demo improved gait mechanics evident by his ability to ambulate atleast 100 ft with noted heel off and minimal knee flexion throughout stance, using LRAD.    Time 8   Period Weeks   Status On-going               Plan - 08/02/16 0858    Clinical Impression Statement Pt now takes his blood pressure medication on a regular interval as pt called MD after last session.  Pt treatment stayed non  weight bearing as pt as complaint of lt knee pain but pt also  substitues greatly when completing exercises in a weighted position.     Rehab Potential Good   PT Frequency 2x / week   PT Duration 8 weeks   PT Treatment/Interventions ADLs/Self Care Home Management;Gait training;Stair training;Functional mobility training;Therapeutic activities;Orthotic Fit/Training;Therapeutic exercise;Patient/family education;Neuromuscular re-education;Balance training;Manual techniques;Passive range of motion   PT Next Visit Plan   Continue with hamstring stretch, hip abduction strength progression, ankle PF strengthening with slow heel toe gt at parallel bars. Progress weights./reps as able.    PT Home Exercise Plan Eval: Ankle PF with red TB 2x15, bridge x20; 10/04: STS no HHA; 11/03 SLS and tandem stance; heel raise, toe raise, hip abduction       Patient will benefit from skilled therapeutic intervention in order to improve the following deficits and impairments:  Abnormal gait, Decreased activity tolerance, Decreased strength, Impaired flexibility, Pain, Postural dysfunction, Improper body mechanics, Difficulty walking, Decreased mobility, Decreased endurance, Decreased balance, Decreased range of motion  Visit Diagnosis: Unsteadiness on feet  Muscle weakness (generalized)  Other symptoms and signs involving the musculoskeletal system  Other abnormalities of gait and mobility     Problem List Patient Active Problem List   Diagnosis Date Noted  . Arrhythmia 05/20/2016  . Angina decubitus ([place]) 05/19/2016  . Congestive heart failure ([place]) 05/17/2016  . Pain in the chest 05/17/2016  . Pulmonary nodule 05/17/2016  . CAP (community acquired pneumonia) 04/11/2016  . Elevated troponin 04/11/2016  . Nonhealing skin ulcer ([place]) 10/28/2014  . Bilateral carotid artery disease ([place]) 07/08/2014  . Critical lower limb ischemia 02/28/2014  . Obstructive sleep apnea 02/12/2014  . Aftercare following surgery of the circulatory system, [place] 12/04/2013  . Restless leg  syndrome 10/23/2013  . Obesity (BMI 30.0-34.9) 10/23/2013  . Hyperlipidemia 07/16/2013  . Peripheral arterial disease ([place]) 07/16/2013  . History of stroke June 2013 03/14/2012  . Hypertension 03/14/2012  . Type 2 diabetes mellitus with circulatory disorder ([place]) 03/14/2012  . Peripheral neuropathy ([place]) 03/14/2012  . Cardiomyopathy- EF NL 01/2013, now 30-35% echo 03/14/2012  . ARTHRITIS, RIGHT FOOT 06/26/2008   Rayetta Humphrey, PT CLT [phone] 08/02/2016, 9:01 AM  [facility] [address] [place], [place], [address] Phone: [phone]   Fax:  [phone]  Name: OLIN GURSKI MRN: [id number] Date of Birth: September 03, 1946

## 2016-08-06 ENCOUNTER — Ambulatory Visit (HOSPITAL_COMMUNITY): Payer: PPO | Admitting: Physical Therapy

## 2016-08-06 DIAGNOSIS — M6281 Muscle weakness (generalized): Secondary | ICD-10-CM | POA: Diagnosis not present

## 2016-08-06 DIAGNOSIS — R2689 Other abnormalities of gait and mobility: Secondary | ICD-10-CM

## 2016-08-06 DIAGNOSIS — R2681 Unsteadiness on feet: Secondary | ICD-10-CM

## 2016-08-06 DIAGNOSIS — R29898 Other symptoms and signs involving the musculoskeletal system: Secondary | ICD-10-CM

## 2016-08-06 NOTE — Therapy (Signed)
[place], [place], [address] Phone: [phone]   Fax:  [phone]  Physical Therapy Treatment  Patient Details  Name: [name] MRN: [id number] Date of Birth: 11/10/1945 Referring Provider: Erline Hau   Encounter Date: 08/06/2016      PT End of Session - 08/06/16 0836    Visit Number 13   Number of Visits 17   Date for PT Re-Evaluation 07/21/16   Authorization Type [employer]    Authorization Time Period 06/23/16 to 08/20/16   Authorization - Visit Number 13   Authorization - Number of Visits 17   PT Start Time 0814   PT Stop Time 0900   PT Time Calculation (min) 43 min   Equipment Utilized During Treatment Gait belt   Activity Tolerance Patient tolerated treatment well;No increased pain   Behavior During Therapy WFL for tasks assessed/performed      Past Medical History:  Diagnosis Date  . Arthritis   . Cardiomyopathy- EF NL 01/2013, now 30-35% echo 03/14/2012  . Carotid artery occlusion    left s/p CEA  . Diabetes mellitus   . Hypertension   . Hypothyroidism   . Obstructive sleep apnea   . Peripheral arterial disease ([place]), s/p PTA x 2 RLE    nonhealing ulcers bilaterally on each great toe  . PONV (postoperative nausea and vomiting)   . Restless leg syndrome   . Shortness of breath   . Stroke [facility]) March 08, 2012    Past Surgical History:  Procedure Laterality Date  . ANGIOPLASTY  02/28/14   diamond back orbital rotational atherectomy of Rt. tibial  . BACK SURGERY    . CARDIAC CATHETERIZATION N/A 05/19/2016   Procedure: Right/Left Heart Cath and Coronary Angiography;  Surgeon: Belva Crome, MD;  Location: [facility];  Service: Cardiovascular;  Laterality: N/A;  . CERVICAL FUSION    . ENDARTERECTOMY Left 11/29/2013   Procedure: ENDARTERECTOMY CAROTID;  Surgeon: Serafina Mitchell, MD;  Location: [place];  Service: Vascular;  Laterality: Left;  . EYE SURGERY    . FOOT  SURGERY    . Lower ext duplex doppler  03/14/14   Rt ABI 1.2  . LOWER EXTREMITY ANGIOGRAM Bilateral 02/18/2014   Procedure: LOWER EXTREMITY ANGIOGRAM;  Surgeon: [name], MD;  Location: [facility] CATH LAB;  Service: Cardiovascular;  Laterality: Bilateral;  . LOWER EXTREMITY ANGIOGRAM N/A 10/31/2014   Procedure: LOWER EXTREMITY ANGIOGRAM;  Surgeon: [name], MD;  Location: [facility] CATH LAB;  Service: Cardiovascular;  Laterality: N/A;  . PV angiogram  02/18/2014   tibial vessel diseas bil.  [name] SPINE SURGERY    . tendon achillies lengthing and sesamoid      There were no vitals filed for this visit.      Subjective Assessment - 08/06/16 0823    Subjective Pt states that he has been stretching his Lt knee out all week so it is sore.    Pertinent History Arthritis, HTN, PAD, DM, back surgery   Limitations Walking   How long can you sit comfortably? no Problem   How long can you stand comfortably? hours was 30 minutes    How long can you walk comfortably?  Pt uses a cane and can walk for 30 minutes at a time was 5 minutes    Patient Stated Goals better balance    Pain Score 4    Pain Location Knee   Pain Orientation Left  Pain Descriptors / Indicators Aching                         [facility] Adult PT Treatment/Exercise - 08/06/16 0001      Knee/Hip Exercises: Stretches   Active Hamstring Stretch Both;2 reps;60 seconds   Quad Stretch Both;1 rep;60 seconds   Hip Flexor Stretch Both;1 rep;Limitations   Hip Flexor Stretch Limitations 1:30     Knee/Hip Exercises: Standing   Heel Raises Both;10 reps   Other Standing Knee Exercises side step with t-band 2 RT      Knee/Hip Exercises: Supine   Quad Sets Both;10 reps   Bridges 10 reps   Bridges Limitations straighten rt leg then left then lower x 10    Other Supine Knee/Hip Exercises ankle dorisflexion/plantarflexion x 15      Knee/Hip Exercises: Sidelying   Hip ABduction Strengthening;Both;15 reps     Knee/Hip  Exercises: Prone   Hamstring Curl 15 reps   Hamstring Curl Limitations B 4#   Hip Extension Strengthening;Both;15 reps   Hip Extension Limitations Rt 4# Lt 0#                   PT Short Term Goals - 07/26/16 0910      PT SHORT TERM GOAL #1   Title Pt will demo consistency and independence with his HEP to improve balance and strength   Time 3   Period Weeks   Status Achieved     PT SHORT TERM GOAL #2   Title Pt will demo improve ankle PF strength evideny by his ability to perform atleast 5 consecutive heel lifts on each LE, to improve his gait pattern.    Time 4   Period Weeks   Status On-going           PT Long Term Goals - 07/26/16 0911      PT LONG TERM GOAL #1   Title Pt will demo improved B hip strength to atleast 4+/5 MMT to increase his safety with functional tasks.    Time 8   Period Weeks   Status Partially Met     PT LONG TERM GOAL #2   Title Pt will perform SLS on each LE for up to 10 sec without LOB, 3/5 trials, to decrease risk of falling and injury.   Time 8   Period Weeks   Status On-going     PT LONG TERM GOAL #3   Title Pt will demo improved functional strength evident by his ability to perform 5x sit to stand in atleast 12 sec, without UE and crashing into the chair.    Time 8   Period Weeks   Status On-going     PT LONG TERM GOAL #4   Title Pt will perform TUG in less than 14 sec, with LRAD to represent a decreased is risk of falling in the community.   Time 8   Period Weeks   Status On-going     PT LONG TERM GOAL #5   Title Pt will demo improved hamstring flexibilty to lacking no more than 30 deg BLE, to improve his sitting and standing posture.    Time 8   Period Weeks   Status On-going     PT LONG TERM GOAL #6   Title Pt will demo improved gait mechanics evident by his ability to ambulate atleast 100 ft with noted heel off and minimal knee flexion throughout stance, using LRAD.  Time 8   Period Weeks   Status On-going                Plan - 08/06/16 0836    Clinical Impression Statement Noted improvement of gt; pt believes it is due to hamstring stretching.  Pt demonstrating better form during exercises.  Added side stepping witn t-band for both strengthening and balance    Rehab Potential Good   PT Frequency 2x / week   PT Duration 8 weeks   PT Treatment/Interventions ADLs/Self Care Home Management;Gait training;Stair training;Functional mobility training;Therapeutic activities;Orthotic Fit/Training;Therapeutic exercise;Patient/family education;Neuromuscular re-education;Balance training;Manual techniques;Passive range of motion   PT Next Visit Plan Continue to progress balance    PT Home Exercise Plan Eval: Ankle PF with red TB 2x15, bridge x20; 10/04: STS no HHA; 11/03 SLS and tandem stance; heel raise, toe raise, hip abduction       Patient will benefit from skilled therapeutic intervention in order to improve the following deficits and impairments:  Abnormal gait, Decreased activity tolerance, Decreased strength, Impaired flexibility, Pain, Postural dysfunction, Improper body mechanics, Difficulty walking, Decreased mobility, Decreased endurance, Decreased balance, Decreased range of motion  Visit Diagnosis: Unsteadiness on feet  Muscle weakness (generalized)  Other symptoms and signs involving the musculoskeletal system  Other abnormalities of gait and mobility     Problem List Patient Active Problem List   Diagnosis Date Noted  . Arrhythmia 05/20/2016  . Angina decubitus ([place]) 05/19/2016  . Congestive heart failure ([place]) 05/17/2016  . Pain in the chest 05/17/2016  . Pulmonary nodule 05/17/2016  . CAP (community acquired pneumonia) 04/11/2016  . Elevated troponin 04/11/2016  . Nonhealing skin ulcer ([place]) 10/28/2014  . Bilateral carotid artery disease ([place]) 07/08/2014  . Critical lower limb ischemia 02/28/2014  . Obstructive sleep apnea 02/12/2014  . Aftercare following surgery of  the circulatory system, [place] 12/04/2013  . Restless leg syndrome 10/23/2013  . Obesity (BMI 30.0-34.9) 10/23/2013  . Hyperlipidemia 07/16/2013  . Peripheral arterial disease ([place]) 07/16/2013  . History of stroke June 2013 03/14/2012  . Hypertension 03/14/2012  . Type 2 diabetes mellitus with circulatory disorder ([place]) 03/14/2012  . Peripheral neuropathy ([place]) 03/14/2012  . Cardiomyopathy- EF NL 01/2013, now 30-35% echo 03/14/2012  . ARTHRITIS, RIGHT FOOT 06/26/2008    Rayetta Humphrey, PT CLT [phone] 08/06/2016, 8:59 AM  [place], [place], [address] Phone: [phone]   Fax:  [phone]  Name: [name] MRN: [id number] Date of Birth: 04-Jun-1946

## 2016-08-09 ENCOUNTER — Ambulatory Visit (HOSPITAL_COMMUNITY): Payer: PPO | Admitting: Physical Therapy

## 2016-08-09 DIAGNOSIS — R2681 Unsteadiness on feet: Secondary | ICD-10-CM

## 2016-08-09 DIAGNOSIS — R2689 Other abnormalities of gait and mobility: Secondary | ICD-10-CM

## 2016-08-09 DIAGNOSIS — M6281 Muscle weakness (generalized): Secondary | ICD-10-CM | POA: Diagnosis not present

## 2016-08-09 DIAGNOSIS — R29898 Other symptoms and signs involving the musculoskeletal system: Secondary | ICD-10-CM

## 2016-08-09 NOTE — Therapy (Signed)
[place], [place], [address] Phone: [phone]   Fax:  [phone]  Physical Therapy Treatment  Patient Details  Name: [name] MRN: [id number] Date of Birth: 07/24/1946 Referring Provider: Erline Hau   Encounter Date: 08/09/2016      PT End of Session - 08/09/16 0858    Visit Number 14   Number of Visits 17   Date for PT Re-Evaluation 08/20/16   Authorization Type [employer]    Authorization Time Period 06/23/16 to 08/20/16   Authorization - Visit Number 14   Authorization - Number of Visits 17   PT Start Time 0815   PT Stop Time 0901   PT Time Calculation (min) 46 min   Activity Tolerance Patient tolerated treatment well   Behavior During Therapy [facility] for tasks assessed/performed      Past Medical History:  Diagnosis Date  . Arthritis   . Cardiomyopathy- EF NL 01/2013, now 30-35% echo 03/14/2012  . Carotid artery occlusion    left s/p CEA  . Diabetes mellitus   . Hypertension   . Hypothyroidism   . Obstructive sleep apnea   . Peripheral arterial disease ([place]), s/p PTA x 2 RLE    nonhealing ulcers bilaterally on each great toe  . PONV (postoperative nausea and vomiting)   . Restless leg syndrome   . Shortness of breath   . Stroke [facility]) March 08, 2012    Past Surgical History:  Procedure Laterality Date  . ANGIOPLASTY  02/28/14   diamond back orbital rotational atherectomy of Rt. tibial  . BACK SURGERY    . CARDIAC CATHETERIZATION N/A 05/19/2016   Procedure: Right/Left Heart Cath and Coronary Angiography;  Surgeon: Belva Crome, MD;  Location: [place] CV LAB;  Service: Cardiovascular;  Laterality: N/A;  . CERVICAL FUSION    . ENDARTERECTOMY Left 11/29/2013   Procedure: ENDARTERECTOMY CAROTID;  Surgeon: Serafina Mitchell, MD;  Location: [place];  Service: Vascular;  Laterality: Left;  . EYE SURGERY    . FOOT SURGERY    . Lower ext duplex doppler  03/14/14   Rt ABI 1.2  .  LOWER EXTREMITY ANGIOGRAM Bilateral 02/18/2014   Procedure: LOWER EXTREMITY ANGIOGRAM;  Surgeon: [name], MD;  Location: [place] CATH LAB;  Service: Cardiovascular;  Laterality: Bilateral;  . LOWER EXTREMITY ANGIOGRAM N/A 10/31/2014   Procedure: LOWER EXTREMITY ANGIOGRAM;  Surgeon: [name], MD;  Location: [facility] CATH LAB;  Service: Cardiovascular;  Laterality: N/A;  . PV angiogram  02/18/2014   tibial vessel diseas bil.  [name] SPINE SURGERY    . tendon achillies lengthing and sesamoid      There were no vitals filed for this visit.      Subjective Assessment - 08/09/16 0819    Subjective Patient arrives today stating no major changes today, he is having pain in his L knee but that is all; no falls or close calls since last session. The hardest thing for him to do right now is dealing with the pain in his L LE, he states that the stretches have been helping somewhat but he feels a little stuck.    Pertinent History Arthritis, HTN, PAD, DM, back surgery   Patient Stated Goals better balance    Currently in Pain? Yes   Pain Score 5    Pain Location Knee   Pain Orientation Left   Pain Descriptors / Indicators Tightness;Pressure   Pain Type Chronic pain  Pain Radiating Towards none    Pain Onset 1 to 4 weeks ago   Pain Frequency Constant   Aggravating Factors  first getting up and standing    Pain Relieving Factors resting    Effect of Pain on Daily Activities can limit walking                          [facility] Adult PT Treatment/Exercise - 08/09/16 0001      Knee/Hip Exercises: Stretches   Active Hamstring Stretch Both;2 reps;30 seconds   Piriformis Stretch Both;2 reps;30 seconds   Gastroc Stretch 2 reps;30 seconds     Knee/Hip Exercises: Seated   Long Arc Quad Both;1 set;15 reps   Long Arc Quad Weight 4 lbs.     Knee/Hip Exercises: Supine   Quad Sets Both;1 set;15 reps   Bridges 15 reps   Bridges Limitations straighten rt leg then left then lower x 10     Other Supine Knee/Hip Exercises ankle dorisflexion/plantarflexion x 15   red TB      Knee/Hip Exercises: Sidelying   Clams --     Knee/Hip Exercises: Prone   Hamstring Curl 20 reps   Hamstring Curl Limitations B 4#   Hip Extension Both;1 set;15 reps   Hip Extension Limitations 4# B                 PT Education - 08/09/16 0858    Education provided Yes   Education Details possible explanation for knee pain- possible mild OA starting due to pain response to exercise and general pain patterns but this would need more extensive examination to be sure    Person(s) Educated Patient   Methods Explanation   Comprehension Verbalized understanding          PT Short Term Goals - 07/26/16 0910      PT SHORT TERM GOAL #1   Title Pt will demo consistency and independence with his HEP to improve balance and strength   Time 3   Period Weeks   Status Achieved     PT SHORT TERM GOAL #2   Title Pt will demo improve ankle PF strength evideny by his ability to perform atleast 5 consecutive heel lifts on each LE, to improve his gait pattern.    Time 4   Period Weeks   Status On-going           PT Long Term Goals - 07/26/16 0911      PT LONG TERM GOAL #1   Title Pt will demo improved B hip strength to atleast 4+/5 MMT to increase his safety with functional tasks.    Time 8   Period Weeks   Status Partially Met     PT LONG TERM GOAL #2   Title Pt will perform SLS on each LE for up to 10 sec without LOB, 3/5 trials, to decrease risk of falling and injury.   Time 8   Period Weeks   Status On-going     PT LONG TERM GOAL #3   Title Pt will demo improved functional strength evident by his ability to perform 5x sit to stand in atleast 12 sec, without UE and crashing into the chair.    Time 8   Period Weeks   Status On-going     PT LONG TERM GOAL #4   Title Pt will perform TUG in less than 14 sec, with LRAD to represent a decreased is risk of falling  in the community.    Time 8   Period Weeks   Status On-going     PT LONG TERM GOAL #5   Title Pt will demo improved hamstring flexibilty to lacking no more than 30 deg BLE, to improve his sitting and standing posture.    Time 8   Period Weeks   Status On-going     PT LONG TERM GOAL #6   Title Pt will demo improved gait mechanics evident by his ability to ambulate atleast 100 ft with noted heel off and minimal knee flexion throughout stance, using LRAD.    Time 8   Period Weeks   Status On-going               Plan - 08/09/16 0906    Clinical Impression Statement Continued with functional strengthening for proximal strength and functional stretching primarily for musculature around L knee due to pain in this joint. Patient states that while he feels the stretches have been helping his L knee, he almost feels at a stand still with his knee pain- recommend considering further examination of knee joint if this pain continues to impair function. Noted severe soft tissue surrounding bilateral hips and began to address this with piriformis stretching due to possible effect on knee pain. Curiously, did not note significant tightness or tenderness to palpation L ITB this session in supine but official Ober test to confirm may be beneficial.    Rehab Potential Good   PT Frequency 2x / week   PT Duration 8 weeks   PT Treatment/Interventions ADLs/Self Care Home Management;Gait training;Stair training;Functional mobility training;Therapeutic activities;Orthotic Fit/Training;Therapeutic exercise;Patient/family education;Neuromuscular re-education;Balance training;Manual techniques;Passive range of motion   PT Next Visit Plan Continue to progress balance and functional strength    PT Home Exercise Plan Eval: Ankle PF with red TB 2x15, bridge x20; 10/04: STS no HHA; 11/03 SLS and tandem stance; heel raise, toe raise, hip abduction    Consulted and Agree with Plan of Care Patient      Patient will benefit from skilled  therapeutic intervention in order to improve the following deficits and impairments:  Abnormal gait, Decreased activity tolerance, Decreased strength, Impaired flexibility, Pain, Postural dysfunction, Improper body mechanics, Difficulty walking, Decreased mobility, Decreased endurance, Decreased balance, Decreased range of motion  Visit Diagnosis: Unsteadiness on feet  Muscle weakness (generalized)  Other symptoms and signs involving the musculoskeletal system  Other abnormalities of gait and mobility     Problem List Patient Active Problem List   Diagnosis Date Noted  . Arrhythmia 05/20/2016  . Angina decubitus ([place]) 05/19/2016  . Congestive heart failure ([place]) 05/17/2016  . Pain in the chest 05/17/2016  . Pulmonary nodule 05/17/2016  . CAP (community acquired pneumonia) 04/11/2016  . Elevated troponin 04/11/2016  . Nonhealing skin ulcer ([place]) 10/28/2014  . Bilateral carotid artery disease ([place]) 07/08/2014  . Critical lower limb ischemia 02/28/2014  . Obstructive sleep apnea 02/12/2014  . Aftercare following surgery of the circulatory system, [place] 12/04/2013  . Restless leg syndrome 10/23/2013  . Obesity (BMI 30.0-34.9) 10/23/2013  . Hyperlipidemia 07/16/2013  . Peripheral arterial disease ([place]) 07/16/2013  . History of stroke June 2013 03/14/2012  . Hypertension 03/14/2012  . Type 2 diabetes mellitus with circulatory disorder ([place]) 03/14/2012  . Peripheral neuropathy ([place]) 03/14/2012  . Cardiomyopathy- EF NL 01/2013, now 30-35% echo 03/14/2012  . ARTHRITIS, RIGHT FOOT 06/26/2008    Deniece Ree PT, DPT [place]  [place], [place], [address] Phone: [phone]   Fax:  [phone]  Name: MESSIAH AHR MRN: [id number] Date of Birth: 1945/09/30

## 2016-08-10 ENCOUNTER — Telehealth: Payer: Self-pay | Admitting: Pharmacist

## 2016-08-10 NOTE — Telephone Encounter (Signed)
Pt call to report pressure 74/45 - instructed to take midodrine 10mg  now  and recheck pressure in 20 minutes and call to report if not improved- He has been taking midodrine in the morning as instructed. He states he has not passed out since our conversation a few weeks ago (which is an improvement), but he has had 1 or two spells of dizziness.   He also reports that his pressure was 174/91 this morning 124/61 after breakfast. He denied symptoms, such as headache or chest pain associated with this higher reading. Advised that we will need to allow his pressure to remain somewhat elevated at this time due to the extreme lows he has been experiencing.   Advised if he feels worse over holiday weekend/overnight to go to ER for treatment.   Pt states understanding and appreciation.

## 2016-08-13 ENCOUNTER — Encounter (HOSPITAL_COMMUNITY): Payer: PPO | Admitting: Physical Therapy

## 2016-08-16 ENCOUNTER — Ambulatory Visit (HOSPITAL_COMMUNITY): Payer: PPO | Admitting: Physical Therapy

## 2016-08-16 DIAGNOSIS — R2689 Other abnormalities of gait and mobility: Secondary | ICD-10-CM

## 2016-08-16 DIAGNOSIS — M6281 Muscle weakness (generalized): Secondary | ICD-10-CM | POA: Diagnosis not present

## 2016-08-16 DIAGNOSIS — R2681 Unsteadiness on feet: Secondary | ICD-10-CM

## 2016-08-16 DIAGNOSIS — R29898 Other symptoms and signs involving the musculoskeletal system: Secondary | ICD-10-CM

## 2016-08-16 NOTE — Therapy (Signed)
[place], [place], [address] Phone: [phone]   Fax:  [phone]  Physical Therapy Treatment  Patient Details  Name: [name] MRN: [id number] Date of Birth: 1946-02-26 Referring Provider: Erline Hau   Encounter Date: 08/16/2016      PT End of Session - 08/16/16 0830    Visit Number 15   Number of Visits 17   Date for PT Re-Evaluation 08/20/16   Authorization Type [employer]    Authorization Time Period 06/23/16 to 08/20/16   Authorization - Visit Number 15   Authorization - Number of Visits 17   PT Start Time 0815   PT Stop Time 0900   PT Time Calculation (min) 45 min   Activity Tolerance Patient tolerated treatment well   Behavior During Therapy [facility] for tasks assessed/performed      Past Medical History:  Diagnosis Date  . Arthritis   . Cardiomyopathy- EF NL 01/2013, now 30-35% echo 03/14/2012  . Carotid artery occlusion    left s/p CEA  . Diabetes mellitus   . Hypertension   . Hypothyroidism   . Obstructive sleep apnea   . Peripheral arterial disease ([place]), s/p PTA x 2 RLE    nonhealing ulcers bilaterally on each great toe  . PONV (postoperative nausea and vomiting)   . Restless leg syndrome   . Shortness of breath   . Stroke [facility]) March 08, 2012    Past Surgical History:  Procedure Laterality Date  . ANGIOPLASTY  02/28/14   diamond back orbital rotational atherectomy of Rt. tibial  . BACK SURGERY    . CARDIAC CATHETERIZATION N/A 05/19/2016   Procedure: Right/Left Heart Cath and Coronary Angiography;  Surgeon: Belva Crome, MD;  Location: [place] CV LAB;  Service: Cardiovascular;  Laterality: N/A;  . CERVICAL FUSION    . ENDARTERECTOMY Left 11/29/2013   Procedure: ENDARTERECTOMY CAROTID;  Surgeon: Serafina Mitchell, MD;  Location: [place];  Service: Vascular;  Laterality: Left;  . EYE SURGERY    . FOOT SURGERY    . Lower ext duplex doppler  03/14/14   Rt ABI 1.2  .  LOWER EXTREMITY ANGIOGRAM Bilateral 02/18/2014   Procedure: LOWER EXTREMITY ANGIOGRAM;  Surgeon: [name], MD;  Location: [facility] CATH LAB;  Service: Cardiovascular;  Laterality: Bilateral;  . LOWER EXTREMITY ANGIOGRAM N/A 10/31/2014   Procedure: LOWER EXTREMITY ANGIOGRAM;  Surgeon: [name], MD;  Location: [facility] CATH LAB;  Service: Cardiovascular;  Laterality: N/A;  . PV angiogram  02/18/2014   tibial vessel diseas bil.  [name] SPINE SURGERY    . tendon achillies lengthing and sesamoid      There were no vitals filed for this visit.      Subjective Assessment - 08/16/16 0820    Subjective Pt states that he is still having pain in his left knee.     Currently in Pain? Yes   Pain Score 3   Will go as high as an 8/10   Pain Location Knee   Pain Orientation Left   Pain Descriptors / Indicators Aching   Pain Radiating Towards to Lt hip    Pain Onset More than a month ago   Pain Frequency Intermittent   Aggravating Factors  standing    Pain Relieving Factors stretching                          [facility] Adult PT Treatment/Exercise -  08/16/16 0001      Knee/Hip Exercises: Stretches   Active Hamstring Stretch Both;3 reps;30 seconds   Piriformis Stretch Both;1 rep;60 seconds   Gastroc Stretch 2 reps;30 seconds   Gastroc Stretch Limitations slant board      Knee/Hip Exercises: Standing   Heel Raises Both;10 reps   Heel Raises Limitations toe raises  x10   Functional Squat 10 reps   SLS 3 x B   Gait Training tandem stance with head turns      Knee/Hip Exercises: Seated   Sit to [employer] 10 reps     Knee/Hip Exercises: Supine   Bridges 15 reps     Knee/Hip Exercises: Sidelying   Hip ABduction Strengthening;Both;15 reps   Hip ABduction Limitations 3#     Knee/Hip Exercises: Prone   Hamstring Curl 10 reps   Hamstring Curl Limitations B 5#   Hip Extension Strengthening;Both;10 reps   Hip Extension Limitations Rt 5#; Lt 0#                  PT Short Term  Goals - 07/26/16 0910      PT SHORT TERM GOAL #1   Title Pt will demo consistency and independence with his HEP to improve balance and strength   Time 3   Period Weeks   Status Achieved     PT SHORT TERM GOAL #2   Title Pt will demo improve ankle PF strength evideny by his ability to perform atleast 5 consecutive heel lifts on each LE, to improve his gait pattern.    Time 4   Period Weeks   Status On-going           PT Long Term Goals - 07/26/16 0911      PT LONG TERM GOAL #1   Title Pt will demo improved B hip strength to atleast 4+/5 MMT to increase his safety with functional tasks.    Time 8   Period Weeks   Status Partially Met     PT LONG TERM GOAL #2   Title Pt will perform SLS on each LE for up to 10 sec without LOB, 3/5 trials, to decrease risk of falling and injury.   Time 8   Period Weeks   Status On-going     PT LONG TERM GOAL #3   Title Pt will demo improved functional strength evident by his ability to perform 5x sit to stand in atleast 12 sec, without UE and crashing into the chair.    Time 8   Period Weeks   Status On-going     PT LONG TERM GOAL #4   Title Pt will perform TUG in less than 14 sec, with LRAD to represent a decreased is risk of falling in the community.   Time 8   Period Weeks   Status On-going     PT LONG TERM GOAL #5   Title Pt will demo improved hamstring flexibilty to lacking no more than 30 deg BLE, to improve his sitting and standing posture.    Time 8   Period Weeks   Status On-going     PT LONG TERM GOAL #6   Title Pt will demo improved gait mechanics evident by his ability to ambulate atleast 100 ft with noted heel off and minimal knee flexion throughout stance, using LRAD.    Time 8   Period Weeks   Status On-going               Plan -  08/16/16 0858    Clinical Impression Statement Pt did not take his medication this morning and became dizzy during treatment.  Pt needed to be cued to keep feet apart during  ambulation as well as to use his cane at all times even if he is walking short distances.  Began standing activity with verbal cuing needed for proper technique.     Rehab Potential Good   PT Frequency 2x / week   PT Duration 8 weeks   PT Treatment/Interventions ADLs/Self Care Home Management;Gait training;Stair training;Functional mobility training;Therapeutic activities;Orthotic Fit/Training;Therapeutic exercise;Patient/family education;Neuromuscular re-education;Balance training;Manual techniques;Passive range of motion   PT Next Visit Plan Reassess next treatment.     PT Home Exercise Plan Eval: Ankle PF with red TB 2x15, bridge x20; 10/04: STS no HHA; 11/03 SLS and tandem stance; heel raise, toe raise, hip abduction    Consulted and Agree with Plan of Care Patient      Patient will benefit from skilled therapeutic intervention in order to improve the following deficits and impairments:  Abnormal gait, Decreased activity tolerance, Decreased strength, Impaired flexibility, Pain, Postural dysfunction, Improper body mechanics, Difficulty walking, Decreased mobility, Decreased endurance, Decreased balance, Decreased range of motion  Visit Diagnosis: Unsteadiness on feet  Muscle weakness (generalized)  Other symptoms and signs involving the musculoskeletal system  Other abnormalities of gait and mobility     Problem List Patient Active Problem List   Diagnosis Date Noted  . Arrhythmia 05/20/2016  . Angina decubitus ([place]) 05/19/2016  . Congestive heart failure ([place]) 05/17/2016  . Pain in the chest 05/17/2016  . Pulmonary nodule 05/17/2016  . CAP (community acquired pneumonia) 04/11/2016  . Elevated troponin 04/11/2016  . Nonhealing skin ulcer ([place]) 10/28/2014  . Bilateral carotid artery disease ([place]) 07/08/2014  . Critical lower limb ischemia 02/28/2014  . Obstructive sleep apnea 02/12/2014  . Aftercare following surgery of the circulatory system, [place] 12/04/2013  . Restless leg  syndrome 10/23/2013  . Obesity (BMI 30.0-34.9) 10/23/2013  . Hyperlipidemia 07/16/2013  . Peripheral arterial disease ([place]) 07/16/2013  . History of stroke June 2013 03/14/2012  . Hypertension 03/14/2012  . Type 2 diabetes mellitus with circulatory disorder ([place]) 03/14/2012  . Peripheral neuropathy ([place]) 03/14/2012  . Cardiomyopathy- EF NL 01/2013, now 30-35% echo 03/14/2012  . ARTHRITIS, RIGHT FOOT 06/26/2008   Rayetta Humphrey, PT CLT [phone] 08/16/2016, 9:01 AM  [place] [address] [place], [place], [address] Phone: [phone]   Fax:  [phone]  Name: STRIDER VALLANCE MRN: [id number] Date of Birth: [birth year]

## 2016-08-19 ENCOUNTER — Telehealth: Payer: Self-pay | Admitting: Pharmacist Clinician (PhC)/ Clinical Pharmacy Specialist

## 2016-08-19 ENCOUNTER — Telehealth (HOSPITAL_COMMUNITY): Payer: Self-pay | Admitting: Pulmonary Disease

## 2016-08-19 NOTE — Telephone Encounter (Signed)
08/19/16  He cx and didn't want to reschedule because he is waiting to find out how many more visits he can have with [place] and will call us back when he knows.

## 2016-08-19 NOTE — Telephone Encounter (Signed)
Patient called to report home blood pressure doing better, but still having some hypotensive episodes.  Has been taking midodrine 5 mg, up to 3-4 tablets per day and reports they seem to be helping.    Asked him to speak to his neurologist about the ropinirole prescription, as that can also cause orthostatic hypotension.  Explained that we don't want him to be without any meds to treat his restless legs, but they may have some other options that would be better.

## 2016-08-20 ENCOUNTER — Ambulatory Visit (HOSPITAL_COMMUNITY): Payer: PPO | Admitting: Physical Therapy

## 2016-08-24 ENCOUNTER — Other Ambulatory Visit: Payer: Self-pay

## 2016-08-24 NOTE — Patient Outreach (Signed)
[facility]) Care Management  08/24/2016  ZACCHARY JARVINEN 06-Jun-1946 [id number]   Telephone call to patient for monthly call. Preferred number disconnected.  Called mobile number no answer.  HIPAA compliant voice message left.    Plan: RN Health Coach will attempt patient again in the month of December.  Jone Baseman, RN, MSN [place] [phone]

## 2016-08-25 ENCOUNTER — Encounter: Payer: Self-pay | Admitting: Cardiovascular Disease

## 2016-08-25 ENCOUNTER — Ambulatory Visit (INDEPENDENT_AMBULATORY_CARE_PROVIDER_SITE_OTHER): Payer: PPO | Admitting: Cardiovascular Disease

## 2016-08-25 DIAGNOSIS — E7800 Pure hypercholesterolemia, unspecified: Secondary | ICD-10-CM

## 2016-08-25 DIAGNOSIS — I10 Essential (primary) hypertension: Secondary | ICD-10-CM

## 2016-08-25 DIAGNOSIS — I998 Other disorder of circulatory system: Secondary | ICD-10-CM

## 2016-08-25 DIAGNOSIS — I70229 Atherosclerosis of native arteries of extremities with rest pain, unspecified extremity: Secondary | ICD-10-CM

## 2016-08-25 DIAGNOSIS — I5020 Unspecified systolic (congestive) heart failure: Secondary | ICD-10-CM

## 2016-08-25 DIAGNOSIS — I951 Orthostatic hypotension: Secondary | ICD-10-CM | POA: Diagnosis not present

## 2016-08-25 NOTE — Assessment & Plan Note (Signed)
History of hypertension with blood pressure measured 102/58. He is on carvedilol. Continue current meds at current dosing

## 2016-08-25 NOTE — Assessment & Plan Note (Signed)
[name] has orthostatic hypotension on Midrin. I seen his blood pressure log of his pressures ranged from 75 systolic to 0000000 systolic. He symptomatic when he is hypotensive. The Midrin does not seem to be efficacious. I'm going to start him on Northera are and have [name] titrate him to a therapeutic dose.

## 2016-08-25 NOTE — Assessment & Plan Note (Signed)
[name] had critical limb ischemia with gangrenous right great toe. I performed peripheral angiography 02/28/14 revealing an occluded anterior tibial on the right, patent peroneal and subtotally occluded posterior tibial. I ended up performing diamondback orbital rotational left colectomy, PTA of his posterior tibial artery. His Dopplers post procedure revealed a right ABI of 0.75 although his most recent Dopplers revealed a right ABI of 1. His ulcer ultimately healed.

## 2016-08-25 NOTE — Assessment & Plan Note (Signed)
History of ischemic cardiac myopathy with an EF of 30-35% by 2-D echo 05/18/16. He recently was admitted with congestive heart failure 05/16/16 through 05/21/16. He had elevated LVEDP at cath as well as from a Low wedge pressure. Dr. Tamala Julian performed coronary angiography revealing a 50% proximal 80% distal LAD, 85% first OM branch stenosis 50% segmental mid-RCA. Medical therapy was recommended the patient denies chest pain.

## 2016-08-25 NOTE — Assessment & Plan Note (Signed)
History of hyperlipidemia on statin therapy followed by his PCP 

## 2016-08-25 NOTE — Patient Instructions (Addendum)
Medication Instructions: Your physician recommends that you continue on your current medications as directed. Please refer to the Current Medication list given to you today.  Discontinue Midodrine  START Northera  Follow-Up: Your physician wants you to follow-up in: 6 months with Dr. Gwenlyn Found. You will receive a reminder letter in the mail two months in advance. If you don't receive a letter, please call our office to schedule the follow-up appointment.  If you need a refill on your cardiac medications before your next appointment, please call your pharmacy.  Check your BP 30 minutes after a meal.  Check both sitting and standing readings, especially if you are feeling light headed.  Be sure to indicate your heart rate on all of these readings.

## 2016-08-25 NOTE — Progress Notes (Signed)
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 He now works at [employer]. He was referred by Dr. [name] from [facility] for peripheral vasodilation because of nonhealing small ulcers on the dorsal surface of his great toes bilaterally. I last saw him in the office 03/19/16. His cardiovascular risk factor profile is remarkable for treated hypertension, diabetes and hyperlipidemia. He has had a stroke 03/08/12 and has carotid disease followed by Dr. [name] ,. He denies chest pain, shortness of breath or claudication. He has had 2 small ulcers on the dorsal surface of both great toes for 6-9 months which have been slow to heal. Since I saw him last in November he has had an elective left carotid endarterectomy performed by Dr. [name] . He was also placed on CPAP because of obstructive sleep apnea which he is benefiting from. He is compliant with his CPAP.[name] He has developed a nonhealing wound on his right great toe which had been fairly rapidly progressing and he sent back for further evaluation. I performed lower extremity Doppler studies last October suggesting tibial vessel disease..I performed angiography on him 03/19/14 demonstrating severe tibial disease and ultimately performed diamondback orbital rotational atherectomy of his right posterior tibial establishing excellent in-line flow. This resulted in ultimate healing of his right great toe ischemic ulcer, improvement in his symptoms and Dopplers as well. He has since developed an ischemic ulcer on the plantar surface of the medial aspect of his right foot as a result of trauma and is being treated by his podiatrist, Dr. Barkley Bruns.follow-up Dopplers  post intervention revealed an increase in his right ABI to 1.2 however, subsequent Dopplers performed 07/02/14 revealed a decrease in his right eye twice a day 0.75 with occlusion of his posterior tibial artery. Because of progression of the ulcer on the dorsal surface of his right heel 3 angina gram him 10/31/14 revealing a subtotally occluded right posterior tibial artery which I re-intervened on with an excellent clinical management result. His subsequent follow-up Dopplers performed 11/08/14 revealed an increase in his right ABI from 0.75 Up to 1.2. His ulcer is slowly healing. Since I saw him a year ago he's remained stable. His most recent lower extremity arterial Doppler studies performed 02/24/16 revealed normal ABIs bilaterally. Since I saw him 6 months ago he was admitted with congestive heart failure and 05/16/16 and diuresis. His EF was 30-35% by 2-D echo. He underwent right and left heart cath by Dr. Tamala Julian revealing an elevated LVEDP and moderate diffuse CAD. Medical therapy was recommended.  Current Outpatient Prescriptions  Medication Sig Dispense Refill  . aspirin EC 81 MG EC tablet Take 1 tablet (81 mg total) by mouth daily.    [name] b complex vitamins tablet Take 1 tablet by mouth daily.    . carvedilol (COREG) 3.125 MG tablet Take 1 tablet (3.125 mg total) by mouth 2 (two) times daily with a meal. 60 tablet 11  . clopidogrel (PLAVIX) 75 MG tablet Take 75 mg by mouth daily.     . furosemide (LASIX) 40 MG tablet Take 1 tablet (40 mg total) by mouth 2 (two) times daily. (Patient taking differently: Take 40 mg by mouth daily. ) 60 tablet 3  . gabapentin (NEURONTIN) 600  MG tablet Take 600 mg by mouth 4 (four) times daily.     [name] glimepiride (AMARYL) 4 MG tablet Take 4 mg by mouth daily with breakfast.    . levothyroxine (SYNTHROID, LEVOTHROID) 25 MCG tablet Take 50 mcg by mouth daily.     . nitroGLYCERIN (NITROSTAT) 0.4 MG SL tablet Place 1 tablet (0.4 mg total) under the tongue every 5 (five)  minutes as needed for chest pain. 25 tablet 3  . Omega 3-6-9 Fatty Acids (OMEGA 3-6-9 COMPLEX) CAPS Take 1 capsule by mouth 2 (two) times daily.    . potassium chloride SA (K-DUR,KLOR-CON) 20 MEQ tablet Take 1 tablet (20 mEq total) by mouth daily. 30 tablet 11  . pramipexole (MIRAPEX) 0.25 MG tablet Take 0.25 mg by mouth at bedtime.    . pravastatin (PRAVACHOL) 40 MG tablet Take 40 mg by mouth every morning.     . Thiamine Mononitrate (VITAMIN B1 PO) Take 1,000 mg by mouth 2 (two) times daily.     No current facility-administered medications for this visit.     Allergies  Allergen Reactions  . Codeine Nausea And Vomiting  . Tramadol Nausea Only    Social History   Social History  . Marital status: Married    Spouse name: N/A  . Number of children: N/A  . Years of education: N/A   Occupational History  . Not on file.   Social History Main Topics  . Smoking status: Former Smoker    Years: 1.00    Types: Pipe    Quit date: 07/08/1977  . Smokeless tobacco: Never Used  . Alcohol use No  . Drug use: No  . Sexual activity: Not on file   Other Topics Concern  . Not on file   Social History Narrative  . No narrative on file     Review of Systems: General: negative for chills, fever, night sweats or weight changes.  Cardiovascular: negative for chest pain, dyspnea on exertion, edema, orthopnea, palpitations, paroxysmal nocturnal dyspnea or shortness of breath Dermatological: negative for rash Respiratory: negative for cough or wheezing Urologic: negative for hematuria Abdominal: negative for nausea, vomiting, diarrhea, bright red blood per rectum, melena, or hematemesis Neurologic: negative for visual changes, syncope, or dizziness All other systems reviewed and are otherwise negative except as noted above.    Blood pressure (!) 102/58, pulse 81, height 6\' 2"  (1.88 m), weight 237 lb (107.5 kg), SpO2 98 %.  General appearance: alert and no distress Neck: no adenopathy,  no carotid bruit, no JVD, supple, symmetrical, trachea midline and thyroid not enlarged, symmetric, no tenderness/mass/nodules Lungs: clear to auscultation bilaterally Heart: regular rate and rhythm, S1, S2 normal, no murmur, click, rub or gallop Extremities: extremities normal, atraumatic, no cyanosis or edema  EKG not performed today  ASSESSMENT AND PLAN:   Hypertension History of hypertension with blood pressure measured 102/58. He is on carvedilol. Continue current meds at current dosing  Orthostatic hypotension [name] has orthostatic hypotension on Midrin. I seen his blood pressure log of his pressures ranged from 75 systolic to 0000000 systolic. He symptomatic when he is hypotensive. The Midrin does not seem to be efficacious. I'm going to start him on Northera are and have [name] titrate him to a therapeutic dose.  Hyperlipidemia History of hyperlipidemia on statin therapy followed by his PCP  Congestive heart failure ([place]) History of ischemic cardiac myopathy with an EF of 30-35% by 2-D echo 05/18/16. He recently was admitted with congestive heart failure 05/16/16 through  05/21/16. He had elevated LVEDP at cath as well as from a Low wedge pressure. Dr. Tamala Julian performed coronary angiography revealing a 50% proximal 80% distal LAD, 85% first OM branch stenosis 50% segmental mid-RCA. Medical therapy was recommended the patient denies chest pain.  Critical lower limb ischemia [name] had critical limb ischemia with gangrenous right great toe. I performed peripheral angiography 02/28/14 revealing an occluded anterior tibial on the right, patent peroneal and subtotally occluded posterior tibial. I ended up performing diamondback orbital rotational left colectomy, PTA of his posterior tibial artery. His Dopplers post procedure revealed a right ABI of 0.75 although his most recent Dopplers revealed a right ABI of 1. His ulcer ultimately healed.      [name] MD FACP,FACC,FAHA,  [facility] 08/25/2016 11:37 AM

## 2016-09-02 ENCOUNTER — Ambulatory Visit: Payer: PPO

## 2016-09-08 DIAGNOSIS — I2510 Atherosclerotic heart disease of native coronary artery without angina pectoris: Secondary | ICD-10-CM | POA: Diagnosis not present

## 2016-09-08 DIAGNOSIS — I739 Peripheral vascular disease, unspecified: Secondary | ICD-10-CM | POA: Diagnosis not present

## 2016-09-08 DIAGNOSIS — I10 Essential (primary) hypertension: Secondary | ICD-10-CM | POA: Diagnosis not present

## 2016-09-09 ENCOUNTER — Other Ambulatory Visit: Payer: Self-pay

## 2016-09-09 NOTE — Patient Outreach (Signed)
[facility]) Care Management  09/09/2016  DIANNE RINEHIMER 19-Nov-1945 [id number]   2nd telephone call to patient for monthly call.  No answer.  HIPAA compliant voice message left.  Plan: RN Health Coach will attempt patient in the month of January.    Jone Baseman, RN, MSN [place] [phone]

## 2016-09-22 ENCOUNTER — Encounter: Payer: Self-pay | Admitting: Podiatry

## 2016-09-22 ENCOUNTER — Ambulatory Visit (INDEPENDENT_AMBULATORY_CARE_PROVIDER_SITE_OTHER): Payer: PPO | Admitting: Podiatry

## 2016-09-22 VITALS — BP 116/50 | HR 83 | Temp 96.6°F | Resp 18

## 2016-09-22 DIAGNOSIS — L97521 Non-pressure chronic ulcer of other part of left foot limited to breakdown of skin: Secondary | ICD-10-CM | POA: Diagnosis not present

## 2016-09-22 DIAGNOSIS — I739 Peripheral vascular disease, unspecified: Secondary | ICD-10-CM | POA: Diagnosis not present

## 2016-09-22 DIAGNOSIS — R0989 Other specified symptoms and signs involving the circulatory and respiratory systems: Secondary | ICD-10-CM | POA: Diagnosis not present

## 2016-09-22 DIAGNOSIS — B351 Tinea unguium: Secondary | ICD-10-CM

## 2016-09-22 MED ORDER — SILVER SULFADIAZINE 1 % EX CREA: 1.0000 "application " | TOPICAL_CREAM | Freq: Every day | CUTANEOUS | 0 refills | Status: DC

## 2016-09-22 NOTE — Progress Notes (Signed)
   Subjective:    Patient ID: [name], male    DOB: [birth year], 71 y.o.   MRN: [id number]  HPI   I am here to get my toenails trimmed and I have a spot on my left big toe that is not healing     Review of Systems     Objective:   Physical Exam        Assessment & Plan:

## 2016-09-22 NOTE — Patient Instructions (Addendum)
Today you presented with approximately one-month history of a skin ulcer. Apply Silvadene cream to the skin ulcer daily and cover with gauze, repeat daily Wear your surgical shoe on left foot Will contact Dr. Gwenlyn Found for a circulation examination. Dr. Kennon Holter office as you scheduled for an appointment on January 4 in the office will contact you for reduce definitive time If you develop any sudden increase in pain, swelling, redness, fever present to the emergency department Also, contact Dr. Prudy Feeler in [place] for possible follow-up for the skin ulcer in your left great toe  Diabetes and Foot Care Diabetes may cause you to have problems because of poor blood supply (circulation) to your feet and legs. This may cause the skin on your feet to become thinner, break easier, and heal more slowly. Your skin may become dry, and the skin may peel and crack. You may also have nerve damage in your legs and feet causing decreased feeling in them. You may not notice minor injuries to your feet that could lead to infections or more serious problems. Taking care of your feet is one of the most important things you can do for yourself. Follow these instructions at home:  Wear shoes at all times, even in the house. Do not go barefoot. Bare feet are easily injured.  Check your feet daily for blisters, cuts, and redness. If you cannot see the bottom of your feet, use a mirror or ask someone for help.  Wash your feet with warm water (do not use hot water) and mild soap. Then pat your feet and the areas between your toes until they are completely dry. Do not soak your feet as this can dry your skin.  Apply a moisturizing lotion or petroleum jelly (that does not contain alcohol and is unscented) to the skin on your feet and to dry, brittle toenails. Do not apply lotion between your toes.  Trim your toenails straight across. Do not dig under them or around the cuticle. File the edges of your nails with an emery board  or nail file.  Do not cut corns or calluses or try to remove them with medicine.  Wear clean socks or stockings every day. Make sure they are not too tight. Do not wear knee-high stockings since they may decrease blood flow to your legs.  Wear shoes that fit properly and have enough cushioning. To break in new shoes, wear them for just a few hours a day. This prevents you from injuring your feet. Always look in your shoes before you put them on to be sure there are no objects inside.  Do not cross your legs. This may decrease the blood flow to your feet.  If you find a minor scrape, cut, or break in the skin on your feet, keep it and the skin around it clean and dry. These areas may be cleansed with mild soap and water. Do not cleanse the area with peroxide, alcohol, or iodine.  When you remove an adhesive bandage, be sure not to damage the skin around it.  If you have a wound, look at it several times a day to make sure it is healing.  Do not use heating pads or hot water bottles. They may burn your skin. If you have lost feeling in your feet or legs, you may not know it is happening until it is too late.  Make sure your health care provider performs a complete foot exam at least annually or more often if you have  foot problems. Report any cuts, sores, or bruises to your health care provider immediately. Contact a health care provider if:  You have an injury that is not healing.  You have cuts or breaks in the skin.  You have an ingrown nail.  You notice redness on your legs or feet.  You feel burning or tingling in your legs or feet.  You have pain or cramps in your legs and feet.  Your legs or feet are numb.  Your feet always feel cold. Get help right away if:  There is increasing redness, swelling, or pain in or around a wound.  There is a red line that goes up your leg.  Pus is coming from a wound.  You develop a fever or as directed by your health care  provider.  You notice a bad smell coming from an ulcer or wound. This information is not intended to replace advice given to you by your health care provider. Make sure you discuss any questions you have with your health care provider. Document Released: 09/03/2000 Document Revised: 02/12/2016 Document Reviewed: 02/13/2013 Elsevier Interactive Patient Education  2017 [employer].

## 2016-09-22 NOTE — Progress Notes (Signed)
Patient ID: ABDIFATAH [name], male   DOB: [birth year], 71 y.o.   MRN: [id number]   Subjective: This patient presents today for scheduled visit for debridement of mycotic toenails. He states that for approximately one month he noticed a spot on the end of his left toe which is not increased in size or appearance to him. He said no specific treatment for this or professional treatment Patient has a history of diabetic wound on the right foot ultimately resolve by Dr. Prudy [name] and was sensation to him with surgical resection of the underlying bone and probable gastroc release  Objective: Orientated 3 DP pulses 2/4 right 0/4 left PT pulses 2/4 right 0/4 left Capillary reflex immediate bilaterally Sensation to 10 g monofilament wire intact 1/5 bilaterally Vibratory sensation nonreactive bilaterally Ankle reflexes reactive bilaterally The distal left hallux as ace 10 mm superficial ulcer. There is no active drainage, malodor, warmth, edema surrounding the ulcer The toenails 6-10 are hypertrophic and deformed Limited range of motion first MPJ bilaterally  Assessment: Diabetic peripheral neuropathy Absent pedal pulses left that need immediate evaluation Clinically noninfected superficial ulcer distal left hallux Mycotic toenails 10  Plan: Patient referred to Dr. Gwenlyn [name] for evaluation for vascular status with a pending appointment on 06/23/2016 The ulcer and left hallux was debrided and treated with Silvadene and gauze dressing patient will apply Silvadene dressing and gauze to the ulcer on the left great toe daily Patient instructed to wear surgical shoe on the left foot Patient instructed that if he notices any sudden increase in pain, swelling, redness in the left foot to present to the emergency department The toenails 6-10 were debrided mechanically and legs without any  bleeding  Patient instructed to contact Dr. Prudy [name] for follow-up appointment for the ulcer in his left hallux. In the  meantime I will reevaluate the ulcer site and the next 7 days

## 2016-09-23 ENCOUNTER — Ambulatory Visit (INDEPENDENT_AMBULATORY_CARE_PROVIDER_SITE_OTHER): Payer: PPO | Admitting: Pharmacist Clinician (PhC)/ Clinical Pharmacy Specialist

## 2016-09-23 ENCOUNTER — Encounter: Payer: Self-pay | Admitting: Pharmacist Clinician (PhC)/ Clinical Pharmacy Specialist

## 2016-09-23 DIAGNOSIS — I10 Essential (primary) hypertension: Secondary | ICD-10-CM

## 2016-09-23 MED ORDER — LISINOPRIL 5 MG PO TABS: 5.0000 mg | ORAL_TABLET | Freq: Every day | ORAL | 3 refills | Status: DC

## 2016-09-23 NOTE — Progress Notes (Signed)
Patient ID: [name]                 DOB: 1945-10-08                      MRN: [id number]     HPI: [name] is a 71 y.o. male patient of Dr. Gwenlyn [name] with [place] below who presents today for follow up of orthostatic hypotension. We have worked with him over the past several months, trying to get his pressure under better control.  He has problems with elevated pressures in the morning, yet getting orthostatic multiple times per day.  At his last visit with [place] we recommended that he speak to his PCP about switching the ropinirole he takes for RLS to pramipexole.  He made this switch and has [name] a marked decrease in hypotensive events.  Dr. Gwenlyn [name] had considered that we start him on Northera for neurogenic orthostatic hypotension, but as he has improved with medication changes we will not try to get that for him now.  Not sure that he would qualify, as the manufacturer does not cover for RLS.  Today he reports he has not taken any midodrine in the past month.  He reports only feeling dizziness when drinking out of bottles.  When asked to clarify about other cups/mugs, he states that unless drinking from a bottle, he uses a straw.     Cardiac Hx: HTN, DM, HLD, OSA, CVA, CHF with EF 30-35%  Current HTN meds:  Carvedilol 3.125mg  BID Furosemide 40mg  every other day Lisinopril 2.5 mg qd   Family History: He reports that his mother had hypertension.   Social History: He smoked a pipe in college but has remain off tobacco products since then. He denies alcohol.   Diet: He states he is somewhat frustrated over trying to find food that meet his strict sodium needs. He has spent hours looking for snacks that are low in sodium. He reports that just has made his own snacks with unsalted nuts, sunflower seeds, and dried cranberries. He has also started using more garlic and onion in place of salt. He love salsa and has [name] a good brand that is relatively low in sodium.   Exercise: likes to walk  although finds it difficult to get around easily.  Has just joined [facility], hoping to go to daily [employer] class.    Home BP readings: did not bring list today, but states that he has had no readings with the systolic 123456 in the past month.  No falls or vertigo except when drinking from bottles.   BP still fairly high in the AM, was 188/90 by his verbal report this morning.   Wt Readings from Last 3 Encounters:  08/25/16 237 lb (107.5 kg)  07/19/16 236 lb 1.6 oz (107.1 kg)  06/16/16 236 lb (107 kg)   BP Readings from Last 3 Encounters:  09/23/16 122/62  09/22/16 (!) 116/50  08/25/16 (!) 102/58   Pulse Readings from Last 3 Encounters:  09/23/16 76  09/22/16 83  08/25/16 81    Renal function: CrCl cannot be calculated (Patient's most recent lab result is older than the maximum 21 days allowed.).  Past Medical History:  Diagnosis Date  . Arthritis   . Cardiomyopathy- EF NL 01/2013, now 30-35% echo 03/14/2012  . Carotid artery occlusion    left s/p CEA  . Diabetes mellitus   . Hypertension   . Hypothyroidism   . Obstructive sleep  apnea   . Peripheral arterial disease ([place]), s/p PTA x 2 RLE    nonhealing ulcers bilaterally on each great toe  . PONV (postoperative nausea and vomiting)   . Restless leg syndrome   . Shortness of breath   . Stroke [facility]) March 08, 2012    Current Outpatient Prescriptions on File Prior to Visit  Medication Sig Dispense Refill  . aspirin EC 81 MG EC tablet Take 1 tablet (81 mg total) by mouth daily.    [name] b complex vitamins tablet Take 1 tablet by mouth daily.    . carvedilol (COREG) 3.125 MG tablet Take 1 tablet (3.125 mg total) by mouth 2 (two) times daily with a meal. 60 tablet 11  . clopidogrel (PLAVIX) 75 MG tablet Take 75 mg by mouth daily.     . furosemide (LASIX) 40 MG tablet Take 1 tablet (40 mg total) by mouth 2 (two) times daily. (Patient taking differently: Take 40 mg by mouth daily. ) 60 tablet 3  . gabapentin (NEURONTIN) 600 MG  tablet Take 600 mg by mouth 4 (four) times daily.     [name] glimepiride (AMARYL) 4 MG tablet Take 4 mg by mouth daily with breakfast.    . levothyroxine (SYNTHROID, LEVOTHROID) 25 MCG tablet Take 50 mcg by mouth daily.     . nitroGLYCERIN (NITROSTAT) 0.4 MG SL tablet Place 1 tablet (0.4 mg total) under the tongue every 5 (five) minutes as needed for chest pain. 25 tablet 3  . Omega 3-6-9 Fatty Acids (OMEGA 3-6-9 COMPLEX) CAPS Take 1 capsule by mouth 2 (two) times daily.    . potassium chloride SA (K-DUR,KLOR-CON) 20 MEQ tablet Take 1 tablet (20 mEq total) by mouth daily. 30 tablet 11  . pramipexole (MIRAPEX) 0.25 MG tablet Take 0.25 mg by mouth at bedtime.    . pravastatin (PRAVACHOL) 40 MG tablet Take 40 mg by mouth every morning.     . silver sulfADIAZINE (SILVADENE) 1 % cream Apply 1 application topically daily. 50 g 0  . Thiamine Mononitrate (VITAMIN B1 PO) Take 1,000 mg by mouth 2 (two) times daily.     No current facility-administered medications on file prior to visit.     Allergies  Allergen Reactions  . Codeine Nausea And Vomiting  . Tramadol Nausea Only    Blood pressure 122/62, pulse 76, height 6\' 2"  (1.88 m).   Assessment/Plan:  Patient with orthostatic hypotension has noticed remarkable improvement since switching his RLS medication from ropinirole to pramipexole.  Still noticing elevated morning BP.  Will have him increase the lisinopril to 5 mg daily (our records show that he was not taking, but he has been on 2.5 mg daily) and continue with regular BP monitoring.  He is to call the office if he develops any further hypotension, especially should he need to use midodrine.  He was also advised to use drinking straws with all liquids, to avoid sudden positional changes that lead to dizziness.  Will follow up with him in one month.   Thank you, [name], PharmD CPP [place]  09/23/2016 1:04 PM

## 2016-09-23 NOTE — Patient Instructions (Addendum)
Return for a a follow up appointment in 1 month  Your blood pressure today is 122/62   Check your blood pressure at home daily each morning and again in the late afternoons/evenings several days per week and keep record of the readings.  Take your BP meds as follows:  Increase lisinopril to 5 mg daily (1 tablet)   Bring all of your meds, your BP cuff and your record of home blood pressures to your next appointment.  Exercise as you're able, try to walk approximately 30 minutes per day.  Keep salt intake to a minimum, especially watch canned and prepared boxed foods.  Eat more fresh fruits and vegetables and fewer canned items.  Avoid eating in fast food restaurants.    HOW TO TAKE YOUR BLOOD PRESSURE: . Rest 5 minutes before taking your blood pressure. .  Don't smoke or drink caffeinated beverages for at least 30 minutes before. . Take your blood pressure before (not after) you eat. . Sit comfortably with your back supported and both feet on the floor (don't cross your legs). . Elevate your arm to heart level on a table or a desk. . Use the proper sized cuff. It should fit smoothly and snugly around your bare upper arm. There should be enough room to slip a fingertip under the cuff. The bottom edge of the cuff should be 1 inch above the crease of the elbow. . Ideally, take 3 measurements at one sitting and record the average.

## 2016-09-23 NOTE — Assessment & Plan Note (Addendum)
Patient with orthostatic hypotension has noticed remarkable improvement since switching his RLS medication from ropinirole to pramipexole.  Still noticing elevated morning BP.  Will have him increase the lisinopril to 5 mg daily (our records show that he was not taking, but he has been on 2.5 mg daily) and continue with regular BP monitoring.  He is to call the office if he develops any further hypotension, especially should he need to use midodrine.  He was also advised to use drinking straws with all liquids, to avoid sudden positional changes that lead to dizziness.  Will follow up with him in one month.

## 2016-09-24 ENCOUNTER — Other Ambulatory Visit: Payer: Self-pay

## 2016-09-24 NOTE — Patient Outreach (Signed)
[facility]) Care Management  09/24/2016  [name] 10/16/1945 [id number]   Telephone call to patient for monthly call.  Patient reports that he is having problems hearing on his phone and asked that I call another time.  Advised patient that I would reschedule him for another time.  He verbalized understanding.    Plan: RN Health Coach will attempt patient again in the month of January.    Jone Baseman, RN, MSN [place] [phone]

## 2016-09-28 ENCOUNTER — Ambulatory Visit: Payer: PPO | Admitting: Podiatry

## 2016-09-29 ENCOUNTER — Encounter: Payer: Self-pay | Admitting: Podiatry

## 2016-09-29 ENCOUNTER — Ambulatory Visit (INDEPENDENT_AMBULATORY_CARE_PROVIDER_SITE_OTHER): Payer: PPO | Admitting: Podiatry

## 2016-09-29 VITALS — BP 126/57 | HR 77 | Temp 97.7°F

## 2016-09-29 DIAGNOSIS — L97521 Non-pressure chronic ulcer of other part of left foot limited to breakdown of skin: Secondary | ICD-10-CM | POA: Diagnosis not present

## 2016-09-29 MED ORDER — AMOXICILLIN-POT CLAVULANATE 875-125 MG PO TABS: 1.0000 | ORAL_TABLET | Freq: Two times a day (BID) | ORAL | 1 refills | Status: DC

## 2016-09-29 NOTE — Progress Notes (Signed)
   Subjective:    Patient ID: [name], male    DOB: February 23, 1946, 71 y.o.   MRN: [id number]  HPI   I  Patient presents for follow-up care for the visit of 09/22/2016. At that time a superficial ulcer was noted in the distal left hallux. Local debridement and Silvadene prescribed with referral to Dr. Gwenlyn Found for vascular exam. Dr. Gwenlyn Found has completed vascular exam, however, has not contact patient with the results. Also had recommended he contact Dr. Donne Hazel had previously surgically treated foot ulcer on the right. Patient at this time has not contact Dr. Prudy Feeler  Review of Systems  All other systems reviewed and are negative.      Objective:   Physical Exam  Objective: Orientated 3 DP pulses 2/4 right 0/4 left PT pulses 2/4 right 0/4 left Capillary reflex immediate bilaterally Sensation to 10 g monofilament wire intact 1/5 bilaterally Vibratory sensation nonreactive bilaterally Ankle reflexes reactive bilaterally The distal left hallux as ace 10 mm superficial ulcer. There is no active drainage, malodor, warmth. Mild edema and erythema edema surrounding distal left hallux. The toenails 6-10 are hypertrophic and deformed Limited range of motion first MPJ bilaterally      Assessment & Plan:   Assessment: Definitive vascular status pending Dr. Gwenlyn Found Superficial skin ulcer left hallux with low-grade cellulitis  Plan: Debrided ulcer and maintain Silvadene dressing Rx Augmentin 875/125 by mouth twice a day 10 days Continue wearing Darco wedge shoe left Instructed patient in the ascendingpain,swelling,redness,fevertopresent to ED  Reappoint7days

## 2016-09-29 NOTE — Patient Instructions (Signed)
Continue to apply Silvadene cream to the skin ulcer to the left great toe daily Begin taking antibiotics by mouth one twice a day If you notice any sudden increase in pain, swelling, fever, drainage, warmth, redness present to the emergency department Pending report from Dr. Gwenlyn Found for vascular evaluation  Diabetes and Foot Care Diabetes may cause you to have problems because of poor blood supply (circulation) to your feet and legs. This may cause the skin on your feet to become thinner, break easier, and heal more slowly. Your skin may become dry, and the skin may peel and crack. You may also have nerve damage in your legs and feet causing decreased feeling in them. You may not notice minor injuries to your feet that could lead to infections or more serious problems. Taking care of your feet is one of the most important things you can do for yourself. Follow these instructions at home:  Wear shoes at all times, even in the house. Do not go barefoot. Bare feet are easily injured.  Check your feet daily for blisters, cuts, and redness. If you cannot see the bottom of your feet, use a mirror or ask someone for help.  Wash your feet with warm water (do not use hot water) and mild soap. Then pat your feet and the areas between your toes until they are completely dry. Do not soak your feet as this can dry your skin.  Apply a moisturizing lotion or petroleum jelly (that does not contain alcohol and is unscented) to the skin on your feet and to dry, brittle toenails. Do not apply lotion between your toes.  Trim your toenails straight across. Do not dig under them or around the cuticle. File the edges of your nails with an emery board or nail file.  Do not cut corns or calluses or try to remove them with medicine.  Wear clean socks or stockings every day. Make sure they are not too tight. Do not wear knee-high stockings since they may decrease blood flow to your legs.  Wear shoes that fit properly and  have enough cushioning. To break in new shoes, wear them for just a few hours a day. This prevents you from injuring your feet. Always look in your shoes before you put them on to be sure there are no objects inside.  Do not cross your legs. This may decrease the blood flow to your feet.  If you find a minor scrape, cut, or break in the skin on your feet, keep it and the skin around it clean and dry. These areas may be cleansed with mild soap and water. Do not cleanse the area with peroxide, alcohol, or iodine.  When you remove an adhesive bandage, be sure not to damage the skin around it.  If you have a wound, look at it several times a day to make sure it is healing.  Do not use heating pads or hot water bottles. They may burn your skin. If you have lost feeling in your feet or legs, you may not know it is happening until it is too late.  Make sure your health care provider performs a complete foot exam at least annually or more often if you have foot problems. Report any cuts, sores, or bruises to your health care provider immediately. Contact a health care provider if:  You have an injury that is not healing.  You have cuts or breaks in the skin.  You have an ingrown nail.  You  notice redness on your legs or feet.  You feel burning or tingling in your legs or feet.  You have pain or cramps in your legs and feet.  Your legs or feet are numb.  Your feet always feel cold. Get help right away if:  There is increasing redness, swelling, or pain in or around a wound.  There is a red line that goes up your leg.  Pus is coming from a wound.  You develop a fever or as directed by your health care provider.  You notice a bad smell coming from an ulcer or wound. This information is not intended to replace advice given to you by your health care provider. Make sure you discuss any questions you have with your health care provider. Document Released: 09/03/2000 Document Revised:  02/12/2016 Document Reviewed: 02/13/2013 Elsevier Interactive Patient Education  2017 [employer].

## 2016-10-11 ENCOUNTER — Other Ambulatory Visit: Payer: Self-pay

## 2016-10-11 NOTE — Patient Outreach (Signed)
[facility]) Care Management  10/11/2016  [name] May 15, 1946 [id number]   3rd Telephone call to patient for monthly call.  No answer.  HIPAA compliant voice message left.   Plan: RN Health Coach will send letter to attempt to reach.    Jone Baseman, RN, MSN [place] [phone]

## 2016-10-21 ENCOUNTER — Other Ambulatory Visit: Payer: Self-pay | Admitting: Cardiovascular Disease

## 2016-10-25 ENCOUNTER — Other Ambulatory Visit: Payer: Self-pay

## 2016-10-25 NOTE — Patient Outreach (Signed)
[facility]) Care Management  10/25/2016  VEGAS TRINGALI 04-Feb-1946 [id number]   Patient has not responded to calls or letter.  Will proceed with case closure.  Will notify care management assistant of case closure.   Jone Baseman, RN, MSN [place] [phone]

## 2016-10-27 NOTE — Progress Notes (Signed)
Patient ID: [name]                 DOB: 1945-10-26                      MRN: [id number]     HPI: [name] is a 71 y.o. male patient of Dr. Gwenlyn Found with [place] below who presents today for follow up of orthostatic hypotension. We have worked with him over the past several months, trying to get his pressure under better control.  He has problems with elevated pressures in the morning, yet getting orthostatic multiple times per day. It was recommended that he speak to his PCP about switching the ropinirole he takes for RLS to pramipexole.  He made this switch and has found a marked decrease in hypotensive events.  Dr. Gwenlyn Found had considered that we start him on Northera for neurogenic orthostatic hypotension, but as he has improved with medication changes we will not try to get that for him now.  Not sure that he would qualify, as the manufacturer does not cover for RLS.  At his most recent visit he reported no issues with dizziness or passing out after the change of medication. In fact his pressures had been running high mostly with no measurements [id number] systolic. His lisinopril was increased to 5mg  daily at that visit to help with elevated pressures.   Occasionally getting lightheaded during the day usually in the afternoons when walking. He freezes for a second and then the dizziness goes away. Second day of lisinopril increase had an episode of dizziness when he quickly got out of bed. He believes that most of these lightheaded spells are unrelated to blood pressure as it occurs in the middle of walking. He has not required any doses of midodrine since his last visit.   Yesterday lasix hit him hard and he had to run to the bathroom several times. His weights have been about the same.   Overall he reports doing well and MUCH improved from when we first started seeing him. He repeatedly states that we have helped him more than we can know and he very much appreciates our efforts on his case.    Cardiac Hx: HTN, DM, HLD, OSA, CVA, CHF with EF 30-35%  Current HTN meds:  Carvedilol 3.125mg  BID Furosemide 40mg  every other day Lisinopril 5 mg qd   Family History: He reports that his mother had hypertension.   Social History: He smoked a pipe in college but has remain off tobacco products since then. He denies alcohol.   Diet: He states he is somewhat frustrated over trying to find food that meet his strict sodium needs. He has spent hours looking for snacks that are low in sodium. He reports that just has made his own snacks with unsalted nuts, sunflower seeds, and dried cranberries. He has also started using more garlic and onion in place of salt. He love salsa and has found a good brand that is relatively low in sodium.   Exercise: likes to walk although finds it difficult to get around easily.  Has just joined [facility], hoping to go to daily [employer] class.    Home BP readings:  He just purchased new cuff and it measures appropriately in office today. He will use this to check his pressures TID for the next few weeks. His cuff 101/53   Wt Readings from Last 3 Encounters:  08/25/16 237 lb (107.5 kg)  07/19/16 236 lb  1.6 oz (107.1 kg)  06/16/16 236 lb (107 kg)   BP Readings from Last 3 Encounters:  10/28/16 98/60  09/29/16 (!) 126/57  09/23/16 122/62   Pulse Readings from Last 3 Encounters:  10/28/16 81  09/29/16 77  09/23/16 76    Renal function: CrCl cannot be calculated (Patient's most recent lab result is older than the maximum 21 days allowed.).  Past Medical History:  Diagnosis Date  . Arthritis   . Cardiomyopathy- EF NL 01/2013, now 30-35% echo 03/14/2012  . Carotid artery occlusion    left s/p CEA  . Diabetes mellitus   . Hypertension   . Hypothyroidism   . Obstructive sleep apnea   . Peripheral arterial disease ([place]), s/p PTA x 2 RLE    nonhealing ulcers bilaterally on each great toe  . PONV (postoperative nausea and vomiting)   . Restless  leg syndrome   . Shortness of breath   . Stroke [facility]) March 08, 2012    Current Outpatient Prescriptions on File Prior to Visit  Medication Sig Dispense Refill  . aspirin EC 81 MG EC tablet Take 1 tablet (81 mg total) by mouth daily.    [name] b complex vitamins tablet Take 1 tablet by mouth daily.    . carvedilol (COREG) 3.125 MG tablet Take 1 tablet (3.125 mg total) by mouth 2 (two) times daily with a meal. 60 tablet 11  . clopidogrel (PLAVIX) 75 MG tablet Take 75 mg by mouth daily.     . furosemide (LASIX) 40 MG tablet TAKE ONE TABLET BY MOUTH TWO TIMES DAILY. (Patient taking differently: TAKE ONE TABLET BY MOUTH once daily) 60 tablet 1  . gabapentin (NEURONTIN) 600 MG tablet Take 600 mg by mouth 4 (four) times daily.     [name] glimepiride (AMARYL) 4 MG tablet Take 4 mg by mouth daily with breakfast.    . levothyroxine (SYNTHROID, LEVOTHROID) 25 MCG tablet Take 50 mcg by mouth daily.     [name] lisinopril (PRINIVIL,ZESTRIL) 5 MG tablet Take 1 tablet (5 mg total) by mouth daily. 90 tablet 3  . Omega 3-6-9 Fatty Acids (OMEGA 3-6-9 COMPLEX) CAPS Take 1 capsule by mouth 2 (two) times daily.    . potassium chloride SA (K-DUR,KLOR-CON) 20 MEQ tablet Take 1 tablet (20 mEq total) by mouth daily. (Patient taking differently: Take 20 mEq by mouth daily. Take 1 tablet every other day) 30 tablet 11  . pramipexole (MIRAPEX) 0.25 MG tablet Take 0.25 mg by mouth at bedtime.    . pravastatin (PRAVACHOL) 40 MG tablet Take 40 mg by mouth every morning.     . midodrine (PROAMATINE) 5 MG tablet Take 5 mg by mouth daily as needed.    . nitroGLYCERIN (NITROSTAT) 0.4 MG SL tablet Place 1 tablet (0.4 mg total) under the tongue every 5 (five) minutes as needed for chest pain. (Patient not taking: Reported on 10/28/2016) 25 tablet 3  . Thiamine Mononitrate (VITAMIN B1 PO) Take 1,000 mg by mouth 2 (two) times daily.     No current facility-administered medications on file prior to visit.     Allergies  Allergen Reactions  .  Codeine Nausea And Vomiting  . Tramadol Nausea Only    Blood pressure 98/60, pulse 81.   Assessment/Plan: Hypertension: BMET today after increase in lisinopril dose. Will continue medications as prescribed. Will have him monitor pressures and call with any change in lightheadedness. Have instructed him to bring log to next visit for medication adjustment. Follow up in 4  weeks.    Thank you, Lelan Pons. Patterson Hammersmith, [facility]  10/28/2016 1:43 PM

## 2016-10-28 ENCOUNTER — Ambulatory Visit: Payer: PPO

## 2016-10-28 ENCOUNTER — Encounter: Payer: Self-pay | Admitting: Pharmacist

## 2016-10-28 ENCOUNTER — Ambulatory Visit (INDEPENDENT_AMBULATORY_CARE_PROVIDER_SITE_OTHER): Payer: PPO | Admitting: Pharmacist

## 2016-10-28 VITALS — BP 98/60 | HR 81

## 2016-10-28 DIAGNOSIS — I10 Essential (primary) hypertension: Secondary | ICD-10-CM

## 2016-10-28 DIAGNOSIS — I951 Orthostatic hypotension: Secondary | ICD-10-CM | POA: Diagnosis not present

## 2016-10-28 NOTE — Patient Instructions (Signed)
Return for a follow up appointment in 4 weeks  Check your blood pressure at home daily (if able) and keep record of the readings.  Take your BP meds as follows: Continue all medications as prescribed  Bring all of your meds, your BP cuff and your record of home blood pressures to your next appointment.  Exercise as you're able, try to walk approximately 30 minutes per day.  Keep salt intake to a minimum, especially watch canned and prepared boxed foods.  Eat more fresh fruits and vegetables and fewer canned items.  Avoid eating in fast food restaurants.    HOW TO TAKE YOUR BLOOD PRESSURE: . Rest 5 minutes before taking your blood pressure. .  Don't smoke or drink caffeinated beverages for at least 30 minutes before. . Take your blood pressure before (not after) you eat. . Sit comfortably with your back supported and both feet on the floor (don't cross your legs). . Elevate your arm to heart level on a table or a desk. . Use the proper sized cuff. It should fit smoothly and snugly around your bare upper arm. There should be enough room to slip a fingertip under the cuff. The bottom edge of the cuff should be 1 inch above the crease of the elbow. . Ideally, take 3 measurements at one sitting and record the average.

## 2016-11-16 ENCOUNTER — Ambulatory Visit (INDEPENDENT_AMBULATORY_CARE_PROVIDER_SITE_OTHER): Payer: PPO

## 2016-11-16 ENCOUNTER — Ambulatory Visit (INDEPENDENT_AMBULATORY_CARE_PROVIDER_SITE_OTHER): Payer: PPO | Admitting: Podiatry

## 2016-11-16 ENCOUNTER — Encounter: Payer: Self-pay | Admitting: Podiatry

## 2016-11-16 VITALS — BP 137/76 | HR 74

## 2016-11-16 DIAGNOSIS — R52 Pain, unspecified: Secondary | ICD-10-CM

## 2016-11-16 DIAGNOSIS — L97521 Non-pressure chronic ulcer of other part of left foot limited to breakdown of skin: Secondary | ICD-10-CM | POA: Diagnosis not present

## 2016-11-16 DIAGNOSIS — M869 Osteomyelitis, unspecified: Secondary | ICD-10-CM

## 2016-11-16 DIAGNOSIS — M79675 Pain in left toe(s): Secondary | ICD-10-CM

## 2016-11-16 DIAGNOSIS — M79672 Pain in left foot: Secondary | ICD-10-CM | POA: Diagnosis not present

## 2016-11-16 MED ORDER — AMOXICILLIN-POT CLAVULANATE 875-125 MG PO TABS: 1.0000 | ORAL_TABLET | Freq: Two times a day (BID) | ORAL | 1 refills | Status: DC

## 2016-11-16 NOTE — Patient Instructions (Signed)
Today your x-ray demonstrated possible bone infection and of your left big toe. In order to confirm this diagnosis we are referring you for an MRI and the imaging center will contact you for definitive ointment Continue taking your Augmentin one twice a day 10 days Apply Silvadene cream to the skin ulcer daily and cover with gauze Wear your surgical shoe on the left foot daily If you develop any sudden increase in pain, swelling, redness, fever, drainage, warmth present to the emergency department   Diabetes and Foot Care Diabetes may cause you to have problems because of poor blood supply (circulation) to your feet and legs. This may cause the skin on your feet to become thinner, break easier, and heal more slowly. Your skin may become dry, and the skin may peel and crack. You may also have nerve damage in your legs and feet causing decreased feeling in them. You may not notice minor injuries to your feet that could lead to infections or more serious problems. Taking care of your feet is one of the most important things you can do for yourself. Follow these instructions at home:  Wear shoes at all times, even in the house. Do not go barefoot. Bare feet are easily injured.  Check your feet daily for blisters, cuts, and redness. If you cannot see the bottom of your feet, use a mirror or ask someone for help.  Wash your feet with warm water (do not use hot water) and mild soap. Then pat your feet and the areas between your toes until they are completely dry. Do not soak your feet as this can dry your skin.  Apply a moisturizing lotion or petroleum jelly (that does not contain alcohol and is unscented) to the skin on your feet and to dry, brittle toenails. Do not apply lotion between your toes.  Trim your toenails straight across. Do not dig under them or around the cuticle. File the edges of your nails with an emery board or nail file.  Do not cut corns or calluses or try to remove them with  medicine.  Wear clean socks or stockings every day. Make sure they are not too tight. Do not wear knee-high stockings since they may decrease blood flow to your legs.  Wear shoes that fit properly and have enough cushioning. To break in new shoes, wear them for just a few hours a day. This prevents you from injuring your feet. Always look in your shoes before you put them on to be sure there are no objects inside.  Do not cross your legs. This may decrease the blood flow to your feet.  If you find a minor scrape, cut, or break in the skin on your feet, keep it and the skin around it clean and dry. These areas may be cleansed with mild soap and water. Do not cleanse the area with peroxide, alcohol, or iodine.  When you remove an adhesive bandage, be sure not to damage the skin around it.  If you have a wound, look at it several times a day to make sure it is healing.  Do not use heating pads or hot water bottles. They may burn your skin. If you have lost feeling in your feet or legs, you may not know it is happening until it is too late.  Make sure your health care provider performs a complete foot exam at least annually or more often if you have foot problems. Report any cuts, sores, or bruises to your  health care provider immediately. Contact a health care provider if:  You have an injury that is not healing.  You have cuts or breaks in the skin.  You have an ingrown nail.  You notice redness on your legs or feet.  You feel burning or tingling in your legs or feet.  You have pain or cramps in your legs and feet.  Your legs or feet are numb.  Your feet always feel cold. Get help right away if:  There is increasing redness, swelling, or pain in or around a wound.  There is a red line that goes up your leg.  Pus is coming from a wound.  You develop a fever or as directed by your health care provider.  You notice a bad smell coming from an ulcer or wound. This information is  not intended to replace advice given to you by your health care provider. Make sure you discuss any questions you have with your health care provider. Document Released: 09/03/2000 Document Revised: 02/12/2016 Document Reviewed: 02/13/2013 Elsevier Interactive Patient Education  2017 [employer].

## 2016-11-16 NOTE — Progress Notes (Signed)
Patient ID: [name], male   DOB: 02/11/1946, 71 y.o.   MRN: [id number]   Subjective: This patient presents today for follow-up care for superficial ulcer on the distal left hallux initially evaluated on the visit of 09/29/2016. At that time Augmentin 875/125 by mouth twice a day 10 days was prescribed patient was advised to wear Darco wedge shoe and apply Silvadene to the wound daily. He was instructed to return 7 days after the visit of 09/29/2016. He does not present for follow-up care to the visit today of 11/16/2016 Patient has history of vascular disease and has been evaluated in the past by Dr. Quay Burow   Objective: Orientated 3 DP pulses 2/4 right 0/4 left PT pulses 2/4 right 0/4 left Capillary reflex immediate bilaterally Sensation to 10 g monofilament wire intact 1/5 bilaterally Vibratory sensation nonreactive bilaterally Ankle reflexes reactive bilaterally The distal left hallux as ace 10 mm superficial ulcer. There is no active drainage, malodor, warmth. Mild edema and erythema edema surrounding distal left hallux. The toenails 6-10 are hypertrophic and deformed Limited range of motion first MPJ bilaterally Patient not walking in wedge shoe  X-ray examination dated 11/16/2016 There is some cortical irregularity in the distal aspect the distal phalanx left hallux. There is no gas formation  Assessment: Ulceration distal left hallux with suspect osteomyelitis Neglected mycotic toenails 6-10 History of peripheral arterial disease Peripheral neuropathy  Plan: Patient informed that there is a possibility that he may have a bone infection in the distal phalanx of the left hallux Schedule patient for MRI of the left hallux Restart Augmentin 875/125 by mouth twice a day 10 days one refill I debridement margins of the skin ulcer and apply Silvadene dressing. nstructed patient apply Silvadene cream to the skin ulcer daily Instructed patient to wear surgical shoe on  the left foot If patient develops any sudden increase in pain, swelling, redness, fever present to the emergency department  Reappoint 7 days for follow-up with Dr. Mayo Ao

## 2016-11-24 ENCOUNTER — Telehealth: Payer: Self-pay | Admitting: Cardiovascular Disease

## 2016-11-24 ENCOUNTER — Ambulatory Visit: Payer: PPO | Admitting: Podiatry

## 2016-11-24 NOTE — Telephone Encounter (Signed)
Returned call to patient-made aware of recommendations. Verbalized understanding and aware of appt in the AM at 830 with pharm.

## 2016-11-24 NOTE — Telephone Encounter (Signed)
New message    Pt c/o BP issue: STAT if pt c/o blurred vision, one-sided weakness or slurred speech  1. What are your last 5 BP readings? 165/90 169/84 199/99 144/76 200/104 197/95 132/78  2. Are you having any other symptoms (ex. Dizziness, headache, blurred vision, passed out)? Pt states he feels funny  3. What is your BP issue? bp is running high.

## 2016-11-24 NOTE — Telephone Encounter (Signed)
Returned call to patient-states over the last 3-4 days his blood pressure has been running high.    BP Readings: 165/90  169/84 199/99 144/76 200/104 197/95 132/78  HR in the 80s consistently.    Denies increase in salt or change in diet, etc.   Verified current medications.  Patient reports an episode of lightheadedness this morning and a HA yesterday.  Denies at current, denies blurry vision, CP, dizziness.  Patient has not taken PRN midodrine.    BP check already scheduled in the AM with pharmacy.  Advised I would route to MD and pharm for recommendations today. Pt verbalized understanding.

## 2016-11-24 NOTE — Telephone Encounter (Signed)
Recommendation:  Take Lisinopril 10mg  today ONLY.  Follow up with pharmacist clinic tomorrow morning at 8:30am.

## 2016-11-25 ENCOUNTER — Ambulatory Visit (INDEPENDENT_AMBULATORY_CARE_PROVIDER_SITE_OTHER): Payer: PPO | Admitting: Pharmacist Clinician (PhC)/ Clinical Pharmacy Specialist

## 2016-11-25 DIAGNOSIS — I10 Essential (primary) hypertension: Secondary | ICD-10-CM

## 2016-11-25 MED ORDER — LISINOPRIL 5 MG PO TABS: 5.0000 mg | ORAL_TABLET | Freq: Two times a day (BID) | ORAL | 1 refills | Status: DC

## 2016-11-25 NOTE — Assessment & Plan Note (Signed)
Patient with only one recent episode of hypotension, systolic down to 79.  Otherwise seems to swing between 130-180 for the most part.  Still has some occasional lightheadedness, not always associated with hypotension.  Will increase his lisinopril to 5 mg twice daily, in hopes of getting his daily BP better controlled without causing him to have more hypotensive episodes.  He will continue with regular home BP checks (he needs to get a new cuff).  He will also go to the lab in the next two weeks and get a BMET.  Will see him back in 6 weeks

## 2016-11-25 NOTE — Patient Instructions (Addendum)
Return for a a follow up appointment in 6 wks  Your blood pressure today is 132/62  Check your blood pressure at home several times each week and keep record of the readings.  Take your BP meds as follows:  Carvedilol 3.125mg  twice daily  Furosemide 40mg  daily  Lisinopril 5 mg twice daily  Midodrine 5 mg prn hypotension   Bring all of your meds, your BP cuff and your record of home blood pressures to your next appointment.  Exercise as you're able, try to walk approximately 30 minutes per day.  Keep salt intake to a minimum, especially watch canned and prepared boxed foods.  Eat more fresh fruits and vegetables and fewer canned items.  Avoid eating in fast food restaurants.    HOW TO TAKE YOUR BLOOD PRESSURE: . Rest 5 minutes before taking your blood pressure. .  Don't smoke or drink caffeinated beverages for at least 30 minutes before. . Take your blood pressure before (not after) you eat. . Sit comfortably with your back supported and both feet on the floor (don't cross your legs). . Elevate your arm to heart level on a table or a desk. . Use the proper sized cuff. It should fit smoothly and snugly around your bare upper arm. There should be enough room to slip a fingertip under the cuff. The bottom edge of the cuff should be 1 inch above the crease of the elbow. . Ideally, take 3 measurements at one sitting and record the average.

## 2016-11-25 NOTE — Progress Notes (Signed)
Patient ID: [name]                 DOB: 02/24/1946                      MRN: [id number]     HPI: [name] is a 71 y.o. male patient of Dr. Gwenlyn Found with [place] [facility] who presents today for follow up of orthostatic hypotension.   We have worked with him over the past year and by switching his restless leg syndrome medication from ropinirole to pramipexole have managed to decrease the number of hypotensive episodes.  Thus he is now taking the midodrine only rarely.  He has not had any doses in the past month.  He called the office yesterday to report high blood pressure readings and was advised to double his lisinopril from 5 mg to 10 mg for just the one day, and to keep his appointment with [place] this morning.     Occasionally getting lightheaded during the day usually in the afternoons when walking. He freezes for a second and then the lightheadedness goes away.  He states there is no dizziness associated with these, just feels as if he is starting to fade out.    Reports that his weight is stable, within 1-2 pounds daily  Overall he reports doing well and MUCH improved from when we first started seeing him. He repeatedly states that we have helped him more than we can know and he very much appreciates our efforts on his case.   Cardiac Hx: HTN, DM, HLD, OSA, CVA, CHF with EF 30-35%  Current HTN meds:  Carvedilol 3.125mg  BID Furosemide 40mg  nightly Lisinopril 5 mg qd  Midodrine 5 mg prn hypotension (none in past month, since switching to mirapex)  Family History: He reports that his mother had hypertension.   Social History: He smoked a pipe in college but has remain off tobacco products since then. He denies alcohol.   Diet: He states he is somewhat frustrated over trying to find food that meet his strict sodium needs. He has spent hours looking for snacks that are low in sodium. He reports that just has made his own snacks with unsalted nuts, sunflower seeds, and dried  cranberries. He has also started using more garlic and onion in place of salt. He love salsa and has found a good brand that is relatively low in sodium.   Exercise: likes to walk although finds it difficult to get around easily.  Has just joined [facility], hoping to go to senior yoga and water aerobics   Home BP readings:  Called in readings yesterday, of 7 readings average was 172/90.  Range was wide 132-200/76- These were taken over the past 2 weeks.      Wt Readings from Last 3 Encounters:  08/25/16 237 lb (107.5 kg)  07/19/16 236 lb 1.6 oz (107.1 kg)  06/16/16 236 lb (107 kg)   BP Readings from Last 3 Encounters:  11/16/16 137/76  10/28/16 98/60  09/29/16 (!) 126/57   Pulse Readings from Last 3 Encounters:  11/16/16 74  10/28/16 81  09/29/16 77    Renal function: CrCl cannot be calculated (Patient's most recent lab result is older than the maximum 21 days allowed.).  Past Medical History:  Diagnosis Date  . Arthritis   . Cardiomyopathy- EF NL 01/2013, now 30-35% echo 03/14/2012  . Carotid artery occlusion    left s/p CEA  . Diabetes mellitus   .  Hypertension   . Hypothyroidism   . Obstructive sleep apnea   . Peripheral arterial disease ([place]), s/p PTA x 2 RLE    nonhealing ulcers bilaterally on each great toe  . PONV (postoperative nausea and vomiting)   . Restless leg syndrome   . Shortness of breath   . Stroke [facility]) March 08, 2012    Current Outpatient Prescriptions on File Prior to Visit  Medication Sig Dispense Refill  . amoxicillin-clavulanate (AUGMENTIN) 875-125 MG tablet Take 1 tablet by mouth 2 (two) times daily. 20 tablet 1  . aspirin EC 81 MG EC tablet Take 1 tablet (81 mg total) by mouth daily.    [name] b complex vitamins tablet Take 1 tablet by mouth daily.    . carvedilol (COREG) 3.125 MG tablet Take 1 tablet (3.125 mg total) by mouth 2 (two) times daily with a meal. 60 tablet 11  . clopidogrel (PLAVIX) 75 MG tablet Take 75 mg by mouth daily.     .  Cyanocobalamin ([employer] B-12) 1500 MCG TBDP Take by mouth.    . furosemide (LASIX) 40 MG tablet TAKE ONE TABLET BY MOUTH TWO TIMES DAILY. (Patient taking differently: TAKE ONE TABLET BY MOUTH once daily) 60 tablet 1  . gabapentin (NEURONTIN) 600 MG tablet Take 600 mg by mouth 4 (four) times daily.     [name] glimepiride (AMARYL) 4 MG tablet Take 4 mg by mouth daily with breakfast.    . levothyroxine (SYNTHROID, LEVOTHROID) 25 MCG tablet Take 50 mcg by mouth daily.     [name] lisinopril (PRINIVIL,ZESTRIL) 5 MG tablet Take 1 tablet (5 mg total) by mouth daily. 90 tablet 3  . midodrine (PROAMATINE) 5 MG tablet Take 5 mg by mouth daily as needed.    . nitroGLYCERIN (NITROSTAT) 0.4 MG SL tablet Place 1 tablet (0.4 mg total) under the tongue every 5 (five) minutes as needed for chest pain. (Patient not taking: Reported on 10/28/2016) 25 tablet 3  . Omega 3-6-9 Fatty Acids (OMEGA 3-6-9 COMPLEX) CAPS Take 1 capsule by mouth 2 (two) times daily.    . potassium chloride SA (K-DUR,KLOR-CON) 20 MEQ tablet Take 1 tablet (20 mEq total) by mouth daily. (Patient taking differently: Take 20 mEq by mouth daily. Take 1 tablet every other day) 30 tablet 11  . pramipexole (MIRAPEX) 0.25 MG tablet Take 0.25 mg by mouth at bedtime.    . pravastatin (PRAVACHOL) 40 MG tablet Take 40 mg by mouth every morning.     . Thiamine Mononitrate (VITAMIN B1 PO) Take 1,000 mg by mouth 2 (two) times daily.     No current facility-administered medications on file prior to visit.     Allergies  Allergen Reactions  . Codeine Nausea And Vomiting  . Tramadol Nausea Only    There were no vitals taken for this visit.   Assessment/Plan:  Patient with only one recent episode of hypotension, systolic down to 79.  Otherwise seems to swing between 130-180 for the most part.  Still has some occasional lightheadedness, not always associated with hypotension.  Will increase his lisinopril to 5 mg twice daily, in hopes of getting his daily BP better  controlled without causing him to have more hypotensive episodes.  He will continue with regular home BP checks (he needs to get a new cuff).  He will also go to the lab in the next two weeks and get a BMET.  Will see him back in 6 weeks    Thank you, Tommy Medal, PharmD CPP  [facility]  11/25/2016 7:10 AM

## 2016-12-01 ENCOUNTER — Other Ambulatory Visit: Payer: Self-pay | Admitting: Podiatry

## 2016-12-01 ENCOUNTER — Ambulatory Visit: Admission: RE | Admit: 2016-12-01 | Payer: PPO | Source: Ambulatory Visit

## 2016-12-01 DIAGNOSIS — M869 Osteomyelitis, unspecified: Secondary | ICD-10-CM

## 2016-12-01 DIAGNOSIS — L97521 Non-pressure chronic ulcer of other part of left foot limited to breakdown of skin: Secondary | ICD-10-CM

## 2016-12-01 DIAGNOSIS — R52 Pain, unspecified: Secondary | ICD-10-CM

## 2016-12-03 ENCOUNTER — Telehealth: Payer: Self-pay | Admitting: *Deleted

## 2016-12-03 ENCOUNTER — Ambulatory Visit
Admission: RE | Admit: 2016-12-03 | Discharge: 2016-12-03 | Disposition: A | Discharge status: Home / Self Care | Payer: PPO | Source: Ambulatory Visit | Attending: Podiatry | Admitting: Podiatry

## 2016-12-03 DIAGNOSIS — L97521 Non-pressure chronic ulcer of other part of left foot limited to breakdown of skin: Secondary | ICD-10-CM

## 2016-12-03 DIAGNOSIS — M869 Osteomyelitis, unspecified: Secondary | ICD-10-CM

## 2016-12-03 DIAGNOSIS — R52 Pain, unspecified: Secondary | ICD-10-CM

## 2016-12-03 NOTE — Telephone Encounter (Signed)
[name] [facility] states pt unable to complete MRI due to restless leg after rescheduling to be on his medication to calm. [name] states if MRI is necessary, pt may have at the hospital with sedation.

## 2016-12-06 ENCOUNTER — Ambulatory Visit: Payer: PPO | Admitting: Podiatry

## 2017-01-06 ENCOUNTER — Ambulatory Visit (INDEPENDENT_AMBULATORY_CARE_PROVIDER_SITE_OTHER): Payer: PPO | Admitting: Pharmacist Clinician (PhC)/ Clinical Pharmacy Specialist

## 2017-01-06 ENCOUNTER — Encounter: Payer: Self-pay | Admitting: Pharmacist Clinician (PhC)/ Clinical Pharmacy Specialist

## 2017-01-06 DIAGNOSIS — I10 Essential (primary) hypertension: Secondary | ICD-10-CM

## 2017-01-06 NOTE — Patient Instructions (Signed)
Call us if you notice your BP goes to > 160 on multiple occasions, or you find you need to use the midodrine again.   Your blood pressure today is 126/72  Check your blood pressure at home 3-4 times per week and keep record of the readings.  We would encourage you to go to the [facility] for water aerobics or senior exercise/yoga classes  Take your BP meds as follows:  Continue with all your current medications  Bring all of your meds, your BP cuff and your record of home blood pressures to your next appointment.  Exercise as you're able, try to walk approximately 30 minutes per day.  Keep salt intake to a minimum, especially watch canned and prepared boxed foods.  Eat more fresh fruits and vegetables and fewer canned items.  Avoid eating in fast food restaurants.    HOW TO TAKE YOUR BLOOD PRESSURE: . Rest 5 minutes before taking your blood pressure. .  Don't smoke or drink caffeinated beverages for at least 30 minutes before. . Take your blood pressure before (not after) you eat. . Sit comfortably with your back supported and both feet on the floor (don't cross your legs). . Elevate your arm to heart level on a table or a desk. . Use the proper sized cuff. It should fit smoothly and snugly around your bare upper arm. There should be enough room to slip a fingertip under the cuff. The bottom edge of the cuff should be 1 inch above the crease of the elbow. . Ideally, take 3 measurements at one sitting and record the average.

## 2017-01-06 NOTE — Assessment & Plan Note (Signed)
Today his blood pressure in the office looked good at 126/72.  Still has noticeable drop upon standing, today to 98/56.  Because of these positional changes, I will not make any changes to his medications.  He is to continue with home monitoring, and I would like to see his systolic readings continue to be < 160.  With his wide fluctuations in BP, I don't want to risk more hypotensive episodes and increase his fall risk.  He is already somewhat unstable on his feet.  He will continue to monitor home readings and will call should he see an increase to > 160 on multiple occasions or the need to use midodrine again.  Otherwise he can follow up with Dr. Gwenlyn Found as indicated.

## 2017-01-06 NOTE — Progress Notes (Signed)
Patient ID: [name]                 DOB: 08/08/1946                      MRN: [id number]     HPI: [name] is a 71 y.o. male patient of Dr. Gwenlyn Found with [place] [facility] who presents today for follow up of orthostatic hypotension.   We have worked with him over the past year and by switching his restless leg syndrome medication from ropinirole to pramipexole have managed to decrease the number of hypotensive episodes. He has not needed to use the midodrine in over 3 months.  While he still does not some lightheadedness at times, it is usually short-lived, and he has not had any recent falls.    Reports that his weight is stable, within 1-2 pounds daily  Overall he reports doing well and MUCH improved from when we first started seeing him. He repeatedly states that we have helped him more than we can know and he very much appreciates our efforts on his case.   Cardiac Hx: HTN, DM, HLD, OSA, CVA, CHF with EF 30-35%  Current HTN meds:  Carvedilol 3.125mg  BID Furosemide 40mg  nightly Lisinopril 5 mg bid  Midodrine 5 mg prn hypotension (none in past 3 months, since switching to mirapex)  Family History: He reports that his mother had hypertension.   Social History: He smoked a pipe in college but has remain off tobacco products since then. He denies alcohol.   Diet: He states he is somewhat frustrated over trying to find food that meet his strict sodium needs. He has spent hours looking for snacks that are low in sodium. He reports that just has made his own snacks with unsalted nuts, sunflower seeds, and dried cranberries. He has also started using more garlic and onion in place of salt. He love salsa and has found a good brand that is relatively low in sodium.   Exercise: likes to walk although finds it difficult to get around easily.  Has just joined [facility], hoping to go to senior yoga and water aerobics - has been stating he wants to do this for several months.  He was again encouraged  to do so.   Home BP readings:  16 readings over past 6 weeks.  Average 141/75, range 99-157/60-85    Wt Readings from Last 3 Encounters:  01/06/17 230 lb (104.3 kg)  11/25/16 228 lb (103.4 kg)  08/25/16 237 lb (107.5 kg)   BP Readings from Last 3 Encounters:  01/06/17 126/72  11/25/16 132/62  11/16/16 137/76   Pulse Readings from Last 3 Encounters:  01/06/17 80  11/25/16 84  11/16/16 74    Renal function: CrCl cannot be calculated (Patient's most recent lab result is older than the maximum 21 days allowed.).  Past Medical History:  Diagnosis Date  . Arthritis   . Cardiomyopathy- EF NL 01/2013, now 30-35% echo 03/14/2012  . Carotid artery occlusion    left s/p CEA  . Diabetes mellitus   . Hypertension   . Hypothyroidism   . Obstructive sleep apnea   . Peripheral arterial disease ([place]), s/p PTA x 2 RLE    nonhealing ulcers bilaterally on each great toe  . PONV (postoperative nausea and vomiting)   . Restless leg syndrome   . Shortness of breath   . Stroke [facility]) March 08, 2012    Current Outpatient Prescriptions on File  Prior to Visit  Medication Sig Dispense Refill  . amoxicillin-clavulanate (AUGMENTIN) 875-125 MG tablet Take 1 tablet by mouth 2 (two) times daily. 20 tablet 1  . aspirin EC 81 MG EC tablet Take 1 tablet (81 mg total) by mouth daily.    [name] b complex vitamins tablet Take 1 tablet by mouth daily.    . carvedilol (COREG) 3.125 MG tablet Take 1 tablet (3.125 mg total) by mouth 2 (two) times daily with a meal. 60 tablet 11  . clopidogrel (PLAVIX) 75 MG tablet Take 75 mg by mouth daily.     . Cyanocobalamin ([employer] B-12) 1500 MCG TBDP Take by mouth.    . furosemide (LASIX) 40 MG tablet TAKE ONE TABLET BY MOUTH TWO TIMES DAILY. (Patient taking differently: TAKE ONE TABLET BY MOUTH once daily) 60 tablet 1  . gabapentin (NEURONTIN) 600 MG tablet Take 600 mg by mouth 4 (four) times daily.     [name] glimepiride (AMARYL) 4 MG tablet Take 4 mg by mouth daily with breakfast.     . levothyroxine (SYNTHROID, LEVOTHROID) 25 MCG tablet Take 50 mcg by mouth daily.     [name] lisinopril (PRINIVIL,ZESTRIL) 5 MG tablet Take 1 tablet (5 mg total) by mouth 2 (two) times daily. 180 tablet 1  . midodrine (PROAMATINE) 5 MG tablet Take 5 mg by mouth daily as needed.    . nitroGLYCERIN (NITROSTAT) 0.4 MG SL tablet Place 1 tablet (0.4 mg total) under the tongue every 5 (five) minutes as needed for chest pain. (Patient not taking: Reported on 10/28/2016) 25 tablet 3  . Omega 3-6-9 Fatty Acids (OMEGA 3-6-9 COMPLEX) CAPS Take 1 capsule by mouth 2 (two) times daily.    . potassium chloride SA (K-DUR,KLOR-CON) 20 MEQ tablet Take 1 tablet (20 mEq total) by mouth daily. (Patient taking differently: Take 20 mEq by mouth daily. Take 1 tablet every other day) 30 tablet 11  . pramipexole (MIRAPEX) 0.25 MG tablet Take 0.25 mg by mouth at bedtime.    . pravastatin (PRAVACHOL) 40 MG tablet Take 40 mg by mouth every morning.     . Thiamine Mononitrate (VITAMIN B1 PO) Take 1,000 mg by mouth 2 (two) times daily.     No current facility-administered medications on file prior to visit.     Allergies  Allergen Reactions  . Codeine Nausea And Vomiting  . Tramadol Nausea Only    Blood pressure 126/72, pulse 80, weight 230 lb (104.3 kg).  Standing 98/56   Assessment/Plan:  Today his blood pressure in the office looked good at 126/72.  Still has noticeable drop upon standing, today to 98/56.  Because of these positional changes, I will not make any changes to his medications.  He is to continue with home monitoring, and I would like to see his systolic readings continue to be < 160.  With his wide fluctuations in BP, I don't want to risk more hypotensive episodes and increase his fall risk.  He is already somewhat unstable on his feet.  He will continue to monitor home readings and will call should he see an increase to > 160 on multiple occasions or the need to use midodrine again.  Otherwise he can follow up  with Dr. Gwenlyn Found as indicated.      Thank you, Tommy Medal, PharmD CPP [facility]  01/06/2017 9:17 AM

## 2017-01-15 ENCOUNTER — Other Ambulatory Visit: Payer: Self-pay | Admitting: Cardiovascular Disease

## 2017-01-17 NOTE — Telephone Encounter (Signed)
REFILL 

## 2017-01-25 ENCOUNTER — Encounter (HOSPITAL_COMMUNITY): Payer: Self-pay | Admitting: Cardiology

## 2017-01-25 ENCOUNTER — Emergency Department (HOSPITAL_COMMUNITY)
Admission: EM | Admit: 2017-01-25 | Discharge: 2017-01-25 | Disposition: A | Discharge status: Home / Self Care | Payer: PPO | Attending: Emergency Medicine | Admitting: Emergency Medicine

## 2017-01-25 DIAGNOSIS — I110 Hypertensive heart disease with heart failure: Secondary | ICD-10-CM | POA: Diagnosis not present

## 2017-01-25 DIAGNOSIS — R42 Dizziness and giddiness: Secondary | ICD-10-CM | POA: Diagnosis not present

## 2017-01-25 DIAGNOSIS — Z79899 Other long term (current) drug therapy: Secondary | ICD-10-CM | POA: Insufficient documentation

## 2017-01-25 DIAGNOSIS — Z7982 Long term (current) use of aspirin: Secondary | ICD-10-CM | POA: Insufficient documentation

## 2017-01-25 DIAGNOSIS — R404 Transient alteration of awareness: Secondary | ICD-10-CM | POA: Diagnosis not present

## 2017-01-25 DIAGNOSIS — E119 Type 2 diabetes mellitus without complications: Secondary | ICD-10-CM | POA: Insufficient documentation

## 2017-01-25 DIAGNOSIS — S0091XA Abrasion of unspecified part of head, initial encounter: Secondary | ICD-10-CM | POA: Diagnosis not present

## 2017-01-25 DIAGNOSIS — I509 Heart failure, unspecified: Secondary | ICD-10-CM | POA: Diagnosis not present

## 2017-01-25 DIAGNOSIS — Z87891 Personal history of nicotine dependence: Secondary | ICD-10-CM | POA: Diagnosis not present

## 2017-01-25 DIAGNOSIS — R55 Syncope and collapse: Secondary | ICD-10-CM | POA: Insufficient documentation

## 2017-01-25 DIAGNOSIS — Z7984 Long term (current) use of oral hypoglycemic drugs: Secondary | ICD-10-CM | POA: Diagnosis not present

## 2017-01-25 DIAGNOSIS — E039 Hypothyroidism, unspecified: Secondary | ICD-10-CM | POA: Diagnosis not present

## 2017-01-25 HISTORY — DX: Orthostatic hypotension: I95.1

## 2017-01-25 LAB — URINALYSIS, ROUTINE W REFLEX MICROSCOPIC
Bilirubin Urine: NEGATIVE
Glucose, UA: >=500 mg/dL — AB
Hgb urine dipstick: NEGATIVE
Ketones, ur: NEGATIVE mg/dL
Leukocytes, UA: NEGATIVE
Nitrite: NEGATIVE
Protein, ur: NEGATIVE mg/dL
Specific Gravity, Urine: 1.024 (ref 1.005–1.030)
pH: 5 (ref 5.0–8.0)

## 2017-01-25 LAB — BASIC METABOLIC PANEL
Anion gap: 9 (ref 5–15)
BUN: 17 mg/dL (ref 6–20)
CO2: 26 mmol/L (ref 22–32)
Calcium: 8.7 mg/dL — ABNORMAL LOW (ref 8.9–10.3)
Chloride: 99 mmol/L — ABNORMAL LOW (ref 101–111)
Creatinine, Ser: 1.17 mg/dL (ref 0.61–1.24)
GFR calc Af Amer: >60 mL/min (ref >60)
GFR calc non Af Amer: >60 mL/min (ref >60)
Glucose, Bld: 282 mg/dL — ABNORMAL HIGH (ref 65–99)
Potassium: 3.9 mmol/L (ref 3.5–5.1)
Sodium: 134 mmol/L — ABNORMAL LOW (ref 135–145)

## 2017-01-25 LAB — MAGNESIUM: Magnesium: 2 mg/dL (ref 1.7–2.4)

## 2017-01-25 LAB — CBC
HCT: 33.4 % — ABNORMAL LOW (ref 39.0–52.0)
Hemoglobin: 11.6 g/dL — ABNORMAL LOW (ref 13.0–17.0)
MCH: 33.1 pg (ref 26.0–34.0)
MCHC: 34.7 g/dL (ref 30.0–36.0)
MCV: 95.4 fL (ref 78.0–100.0)
Platelets: 223 10*3/uL (ref 150–400)
RBC: 3.5 MIL/uL — ABNORMAL LOW (ref 4.22–5.81)
RDW: 13.2 % (ref 11.5–15.5)
WBC: 5.2 10*3/uL (ref 4.0–10.5)

## 2017-01-25 LAB — CBG MONITORING, ED: Glucose-Capillary: 286 mg/dL — ABNORMAL HIGH (ref 65–99)

## 2017-01-25 LAB — TROPONIN I: Troponin I: <0.03 ng/mL (ref <0.03)

## 2017-01-25 MED ORDER — ACETAMINOPHEN 325 MG PO TABS
650.0000 mg | ORAL_TABLET | Freq: Once | ORAL | Status: AC
  Administered 2017-01-25: 650 mg via ORAL
  Filled 2017-01-25: qty 2

## 2017-01-25 MED ORDER — SODIUM CHLORIDE 0.9 % IV BOLUS (SEPSIS)
2000.0000 mL | Freq: Once | INTRAVENOUS | Status: AC
  Administered 2017-01-25: 2000 mL via INTRAVENOUS

## 2017-01-25 MED ORDER — GABAPENTIN 300 MG PO CAPS
600.0000 mg | ORAL_CAPSULE | Freq: Once | ORAL | Status: AC
  Administered 2017-01-25: 600 mg via ORAL
  Filled 2017-01-25: qty 2

## 2017-01-25 NOTE — ED Notes (Signed)
Pt wheeled to waiting room. Pt verbalized understanding of discharge instructions.   

## 2017-01-25 NOTE — ED Notes (Signed)
Pt ambulated well with cane in hall

## 2017-01-25 NOTE — ED Provider Notes (Signed)
[place] DEPT Provider Note   CSN: [id number] Arrival date & time: 01/25/17  1649     History   Chief Complaint Chief Complaint  Patient presents with  . Loss of Consciousness    HPI [name] is a 71 y.o. male.   Loss of Consciousness   This is a recurrent problem. The current episode started 1 to 2 hours ago. The problem has been resolved. There was no loss of consciousness. The problem is associated with standing up. Associated symptoms include light-headedness. His past medical history is significant for HTN. Past medical history comments: CHF.    Past Medical History:  Diagnosis Date  . Arthritis   . Cardiomyopathy- EF NL 01/2013, now 30-35% echo 03/14/2012  . Carotid artery occlusion    left s/p CEA  . Diabetes mellitus   . Hypertension   . Hypothyroidism   . Obstructive sleep apnea   . Orthostatic hypotension   . Peripheral arterial disease ([place]), s/p PTA x 2 RLE    nonhealing ulcers bilaterally on each great toe  . PONV (postoperative nausea and vomiting)   . Restless leg syndrome   . Shortness of breath   . Stroke [facility]) March 08, 2012    Patient Active Problem List   Diagnosis Date Noted  . Orthostatic hypotension 08/25/2016  . Arrhythmia 05/20/2016  . Angina decubitus ([place]) 05/19/2016  . Congestive heart failure ([place]) 05/17/2016  . Pain in the chest 05/17/2016  . Pulmonary nodule 05/17/2016  . CAP (community acquired pneumonia) 04/11/2016  . Elevated troponin 04/11/2016  . Nonhealing skin ulcer ([place]) 10/28/2014  . Bilateral carotid artery disease ([place]) 07/08/2014  . Critical lower limb ischemia 02/28/2014  . Obstructive sleep apnea 02/12/2014  . Aftercare following surgery of the circulatory system, [place] 12/04/2013  . Restless leg syndrome 10/23/2013  . Obesity (BMI 30.0-34.9) 10/23/2013  . Hyperlipidemia 07/16/2013  . Peripheral arterial disease ([place]) 07/16/2013  . History of stroke June 2013 03/14/2012  . Hypertension 03/14/2012  .  Type 2 diabetes mellitus with circulatory disorder ([place]) 03/14/2012  . Peripheral neuropathy 03/14/2012  . Cardiomyopathy- EF NL 01/2013, now 30-35% echo 03/14/2012  . ARTHRITIS, RIGHT FOOT 06/26/2008    Past Surgical History:  Procedure Laterality Date  . ANGIOPLASTY  02/28/14   diamond back orbital rotational atherectomy of Rt. tibial  . BACK SURGERY    . CARDIAC CATHETERIZATION N/A 05/19/2016   Procedure: Right/Left Heart Cath and Coronary Angiography;  Surgeon: Belva Crome, MD;  Location: [place] CV LAB;  Service: Cardiovascular;  Laterality: N/A;  . CERVICAL FUSION    . ENDARTERECTOMY Left 11/29/2013   Procedure: ENDARTERECTOMY CAROTID;  Surgeon: Serafina Mitchell, MD;  Location: [place];  Service: Vascular;  Laterality: Left;  . EYE SURGERY    . FOOT SURGERY    . Lower ext duplex doppler  03/14/14   Rt ABI 1.2  . LOWER EXTREMITY ANGIOGRAM Bilateral 02/18/2014   Procedure: LOWER EXTREMITY ANGIOGRAM;  Surgeon: [name], MD;  Location: [facility] CATH LAB;  Service: Cardiovascular;  Laterality: Bilateral;  . LOWER EXTREMITY ANGIOGRAM N/A 10/31/2014   Procedure: LOWER EXTREMITY ANGIOGRAM;  Surgeon: [name], MD;  Location: [facility] CATH LAB;  Service: Cardiovascular;  Laterality: N/A;  . PV angiogram  02/18/2014   tibial vessel diseas bil.  [name] SPINE SURGERY    . tendon achillies lengthing and sesamoid         Home Medications    Prior to Admission medications   Medication  Sig Start Date End Date Taking? Authorizing Provider  aspirin EC 81 MG EC tablet Take 1 tablet (81 mg total) by mouth daily. Patient taking differently: Take 81 mg by mouth at bedtime.  05/21/16  Yes Barrett, Evelene Croon, PA-C  b complex vitamins tablet Take 1 tablet by mouth daily.   Yes [provider]  carvedilol (COREG) 3.125 MG tablet Take 1 tablet (3.125 mg total) by mouth 2 (two) times daily with a meal. 05/21/16  Yes Barrett, Evelene Croon, PA-C  clopidogrel (PLAVIX) 75 MG tablet Take 75 mg by mouth daily.   06/16/13  Yes [provider]  Cyanocobalamin ([employer] B-12) 1500 MCG TBDP Take 1 tablet by mouth daily.    Yes [provider]  DULoxetine (CYMBALTA) 60 MG capsule Take 60 mg by mouth 2 (two) times daily. 01/20/17  Yes [provider]  furosemide (LASIX) 40 MG tablet TAKE ONE TABLET TWICE DAILY Patient taking differently: TAKE ONE TABLET DAILY AT BEDTIME 01/17/17  Yes [name], MD  gabapentin (NEURONTIN) 600 MG tablet Take 600 mg by mouth 4 (four) times daily.    Yes [provider]  glimepiride (AMARYL) 4 MG tablet Take 4 mg by mouth daily with breakfast.   Yes [provider]  levothyroxine (SYNTHROID, LEVOTHROID) 50 MCG tablet Take 50 mcg by mouth every morning.  06/08/13  Yes [provider]  lisinopril (PRINIVIL,ZESTRIL) 5 MG tablet Take 1 tablet (5 mg total) by mouth 2 (two) times daily. 11/25/16 02/23/17 Yes [name], MD  midodrine (PROAMATINE) 5 MG tablet Take 5 mg by mouth daily as needed (WHEN bp LEVELS DROP).    Yes [provider]  nitroGLYCERIN (NITROSTAT) 0.4 MG SL tablet Place 1 tablet (0.4 mg total) under the tongue every 5 (five) minutes as needed for chest pain. 05/22/16  Yes Barrett, Evelene Croon, PA-C  potassium chloride SA (K-DUR,KLOR-CON) 20 MEQ tablet Take 1 tablet (20 mEq total) by mouth daily. Patient taking differently: Take 20 mEq by mouth daily. Take 1 tablet every other day 05/21/16  Yes Barrett, Evelene Croon, PA-C  pramipexole (MIRAPEX) 0.25 MG tablet Take 0.25 mg by mouth at bedtime.   Yes [provider]  pravastatin (PRAVACHOL) 40 MG tablet Take 40 mg by mouth every morning.  10/06/12  Yes [provider]  tamsulosin (FLOMAX) 0.4 MG CAPS capsule Take 1 capsule by mouth every morning. 01/21/17  Yes [provider]  Thiamine Mononitrate (VITAMIN B1 PO) Take 1,000 mg by mouth 2 (two) times daily.   Yes [provider]    Family History Family History  Problem Relation Age of  Onset  . Heart disease Mother   . Hypertension Mother   . Heart attack Mother   . Hypertension Father   . Diabetes Father   . Diabetes Son   . Heart disease Son   . Hypertension Son     Social History Social History  Substance Use Topics  . Smoking status: Former Smoker    Years: 1.00    Types: Pipe    Quit date: 07/08/1977  . Smokeless tobacco: Never Used  . Alcohol use No     Allergies   Codeine and Tramadol   Review of Systems Review of Systems  Cardiovascular: Positive for syncope.  Neurological: Positive for light-headedness.  All other systems reviewed and are negative.    Physical Exam Updated Vital Signs BP (!) 184/102 (BP Location: Left Arm)   Pulse 85   Temp 98 F (  36.7 C)   Resp 19   Ht 6' (1.829 m)   Wt 230 lb (104.3 kg)   SpO2 98%   BMI 31.19 kg/m   Physical Exam  Constitutional: He appears well-developed and well-nourished.  HENT:  Head: Normocephalic.  Right Ear: External ear normal.  Left Ear: External ear normal.  Neck: Normal range of motion.  Cardiovascular: Normal rate.   Pulmonary/Chest: Effort normal. No respiratory distress.  Abdominal: He exhibits no distension.  Musculoskeletal: Normal range of motion.  No cervical spine tenderness, thoracic spine tenderness or Lumbar spine tenderness.  No tenderness or pain with palpation and full ROM of all joints in upper and lower extremities.  No ecchymosis or other signs of trauma on back or extremities.  No Pain with AP or lateral compression of ribs.  No Paracervical ttp, paraspinal ttp   Neurological: He is alert.  No altered mental status, able to give full seemingly accurate history.  Face is symmetric, EOM's intact, pupils equal and reactive, vision intact, tongue and uvula midline without deviation Upper and Lower extremity motor 5/5, intact pain perception in distal extremities, 2+ reflexes in biceps, patella and achilles tendons. Finger to nose normal, heel to shin normal.  After 2L fluid, he walks without assistance or evident ataxia.    Skin:  Abrasion to back of scalp, no laceration  Nursing note and vitals reviewed.    ED Treatments / Results  Labs (all labs ordered are listed, but only abnormal results are displayed) Labs Reviewed  BASIC METABOLIC PANEL - Abnormal; Notable for the following:       Result Value   Sodium 134 (*)    Chloride 99 (*)    Glucose, Bld 282 (*)    Calcium 8.7 (*)    All other components within normal limits  CBC - Abnormal; Notable for the following:    RBC 3.50 (*)    Hemoglobin 11.6 (*)    HCT 33.4 (*)    All other components within normal limits  URINALYSIS, ROUTINE W REFLEX MICROSCOPIC - Abnormal; Notable for the following:    Color, Urine AMBER (*)    APPearance HAZY (*)    Glucose, UA >=500 (*)    Bacteria, UA RARE (*)    Squamous Epithelial / LPF 0-5 (*)    All other components within normal limits  CBG MONITORING, ED - Abnormal; Notable for the following:    Glucose-Capillary 286 (*)    All other components within normal limits  TROPONIN I  MAGNESIUM    EKG  EKG Interpretation  Date/Time:  Tuesday Jan 25 2017 16:55:51 EDT Ventricular Rate:  88 PR Interval:    QRS Duration: 95 QT Interval:  397 QTC Calculation: 481 R Axis:   54 Text Interpretation:  Sinus rhythm Borderline T abnormalities, lateral leads Borderline prolonged QT interval Confirmed by [facility] MD, Corene Cornea [phone]) on 01/25/2017 5:36:14 PM       Radiology No results found.  Procedures Procedures (including critical care time)  Medications Ordered in ED Medications  sodium chloride 0.9 % bolus 2,000 mL (0 mLs Intravenous Stopped 01/25/17 2123)  acetaminophen (TYLENOL) tablet 650 mg (650 mg Oral Given 01/25/17 1915)  gabapentin (NEURONTIN) capsule 600 mg (600 mg Oral Given 01/25/17 1915)     Initial Impression / Assessment and Plan / ED Course  I have reviewed the triage vital signs and the nursing notes.  Pertinent labs & imaging  results that were available during my care of the patient were reviewed  by me and considered in my medical decision making (see chart for details).     Suspect symptoms related to volume depletion. Improved symptoms with fluids. Ambulates at baseline improved light headedness. Borderline QT on ecg but without palpitations w/ episode, arrhythmia on monitoring here and obvious other cause for symptoms I doubt ventriculary tacchyarhythmia as a cause for his symptoms. Will follow up with PCP for further workup as needed.   Final Clinical Impressions(s) / ED Diagnoses   Final diagnoses:  Near syncope      Sharisa Toves, Corene Cornea, MD 01/26/17 1445

## 2017-01-25 NOTE — ED Triage Notes (Signed)
Syncopal episode today when getting out of his car.  Hit back of head.  Pt has history of syncope.  B/P dropped and pt became dizzy  with sitting up.  CBG 235 EKG wnl .

## 2017-01-26 DIAGNOSIS — I2510 Atherosclerotic heart disease of native coronary artery without angina pectoris: Secondary | ICD-10-CM | POA: Diagnosis not present

## 2017-01-26 DIAGNOSIS — I10 Essential (primary) hypertension: Secondary | ICD-10-CM | POA: Diagnosis not present

## 2017-01-26 DIAGNOSIS — G2581 Restless legs syndrome: Secondary | ICD-10-CM | POA: Diagnosis not present

## 2017-01-26 DIAGNOSIS — E1165 Type 2 diabetes mellitus with hyperglycemia: Secondary | ICD-10-CM | POA: Diagnosis not present

## 2017-02-15 ENCOUNTER — Telehealth: Payer: Self-pay | Admitting: Pharmacist Clinician (PhC)/ Clinical Pharmacy Specialist

## 2017-02-15 ENCOUNTER — Ambulatory Visit (INDEPENDENT_AMBULATORY_CARE_PROVIDER_SITE_OTHER): Payer: PPO | Admitting: Neurology

## 2017-02-15 ENCOUNTER — Encounter: Payer: Self-pay | Admitting: Neurology

## 2017-02-15 VITALS — BP 104/54 | HR 80 | Resp 18 | Ht 72.0 in | Wt 236.0 lb

## 2017-02-15 DIAGNOSIS — G4761 Periodic limb movement disorder: Secondary | ICD-10-CM

## 2017-02-15 DIAGNOSIS — Z789 Other specified health status: Secondary | ICD-10-CM

## 2017-02-15 DIAGNOSIS — G4733 Obstructive sleep apnea (adult) (pediatric): Secondary | ICD-10-CM

## 2017-02-15 DIAGNOSIS — G2581 Restless legs syndrome: Secondary | ICD-10-CM | POA: Diagnosis not present

## 2017-02-15 MED ORDER — PRAMIPEXOLE DIHYDROCHLORIDE 0.25 MG PO TABS: 0.5000 mg | ORAL_TABLET | Freq: Every day | ORAL | 5 refills | Status: DC

## 2017-02-15 NOTE — Patient Instructions (Addendum)
You are drinking too much caffeine and this can exacerbate your restless legs.  Please reduce your caffeine/soda intake.  Please restart your CPAP use immediately, you have moderate sleep apnea and should be on treatment, which can also improve your RLS (restless legs syndrome).  You can restart your gabapentin gradually.  We will increase your Mirapex to 0.25 mg, 2 pills each night. Common side effects reported are: Sedation, sleepiness, nausea, vomiting, and rare side effects are confusion, hallucinations, swelling in legs, and abnormal behaviors, including impulse control problems, which can manifest as excessive eating, obsessions with food or gambling, or hypersexuality. We may need to do another sleep study, but for now, we will increase your medication, and we will check blood work today and call you with the test results.

## 2017-02-15 NOTE — Telephone Encounter (Signed)
Patient calling requests a call back. Thanks.

## 2017-02-15 NOTE — Telephone Encounter (Signed)
Patient reports that he is still having episodes of lightheadedness, especially when getting out of the car on warm days.  Did have an ER visit earlier this month, was believed due in part to dehydration.    Advised that he carry water with him whenever he is out, and take his time getting from the car - should have a drink of water before getting up.

## 2017-02-15 NOTE — Progress Notes (Signed)
Subjective:    Patient ID: CHISOM AUST is a 71 y.o. male.  HPI     Star Age, MD, PhD [facility] [address], [address] [place], [place] [address]  Dear Dr. Luan Pulling,   I saw your patient, Alfredo Spong, upon your kind request in my neurologic clinic today for initial consultation of his restless leg syndrome. The patient is unaccompanied today. As you know, Mr. Percival is a 71 year old left-handed gentleman with an underlying medical history of cardiomyopathy, carotid artery occlusion and status post left carotid endarterectomy, orthostatic hypotension, obesity, hypertension, reflux disease, low back pain, type 2 diabetes, history of stroke, hypothyroidism, peripheral vascular disease, status post back surgery and status post neck surgery, who reports restless legs symptoms for the past many years, likely over 30 years. I reviewed your office note from 01/26/2017, which you kindly included. He has been on Mirapex 0.25 mg strength for the past months, maybe since 12/17. He is also Cymbalta 60 mg twice daily. He presented to the emergency room recently on 01/25/2017 for a near syncopal event. I reviewed the emergency room records. Symptoms were suspected to be secondary to volume depletion and he improved subjectively with fluids. He was recently tried on gabapentin, but felt worse and stopped it. His diabetic PN symptoms are worse since he stopped it, he was on 600 mg 4 times a day and stopped it all at once - I advised him never to stop this med "cold [place]".  He is a non-smoker, does not currently drinking alcohol currently, but drinks a significant amount of caffeine in the form of soda/cola, ("lots") 3 x 44 oz per day, by self report. He reports feeling restless in his body.  He has not used his CPAP in over 2 months, waiting for a cleaning machine for the CPAP. He had sleep study testing last year, he believes, but then recalls it was in 2014. I  reviewed His sleep study report from 08/03/2013. He had a baseline sleep study at the [facility] heart and sleep Center. Sleep efficiency was only 60%, REM latency was 99 minutes, overall AHI was 26 per hour, average oxygen saturation was 90%, nadir was 77% during REM sleep. He had severe PLMS with an index of 127.7 per hour, PLM arousal index was elevated at 52.9 per hour. He recalls that he went back for a second sleep study for CPAP titration. Of note, he has noticed worsening of RLS symptoms since coming off of the CPAP. He has severe nocturia, about 5 times per night, takes his diuretic at night, although he is supposed to take it in the morning. He has never seen a urologist. He works at [employer], outside [employer], on the road a lot, therefore he does not like to take his diuretic during the day as he does not have access to bathrooms frequently. His bedtime is around 10 PM, he tries to take his restless legs medication an hour before. Wake up time is around 9 AM.  His Past Medical History Is Significant For: Past Medical History:  Diagnosis Date  . Arthritis   . Cardiomyopathy- EF NL 01/2013, now 30-35% echo 03/14/2012  . Carotid artery occlusion    left s/p CEA  . Diabetes mellitus   . Hypertension   . Hypothyroidism   . Obstructive sleep apnea   . Orthostatic hypotension   . Peripheral arterial disease ([place]), s/p PTA x 2 RLE    nonhealing ulcers bilaterally on each great  toe  . PONV (postoperative nausea and vomiting)   . Restless leg syndrome   . Shortness of breath   . Stroke [facility]) March 08, 2012    His Past Surgical History Is Significant For: Past Surgical History:  Procedure Laterality Date  . ANGIOPLASTY  02/28/14   diamond back orbital rotational atherectomy of Rt. tibial  . BACK SURGERY    . CARDIAC CATHETERIZATION N/A 05/19/2016   Procedure: Right/Left Heart Cath and Coronary Angiography;  Surgeon: Belva Crome, MD;  Location: [place] CV LAB;  Service:  Cardiovascular;  Laterality: N/A;  . CERVICAL FUSION    . ENDARTERECTOMY Left 11/29/2013   Procedure: ENDARTERECTOMY CAROTID;  Surgeon: Serafina Mitchell, MD;  Location: [place];  Service: Vascular;  Laterality: Left;  . EYE SURGERY    . FOOT SURGERY    . Lower ext duplex doppler  03/14/14   Rt ABI 1.2  . LOWER EXTREMITY ANGIOGRAM Bilateral 02/18/2014   Procedure: LOWER EXTREMITY ANGIOGRAM;  Surgeon: [name], MD;  Location: [facility] CATH LAB;  Service: Cardiovascular;  Laterality: Bilateral;  . LOWER EXTREMITY ANGIOGRAM N/A 10/31/2014   Procedure: LOWER EXTREMITY ANGIOGRAM;  Surgeon: [name], MD;  Location: [facility] CATH LAB;  Service: Cardiovascular;  Laterality: N/A;  . PV angiogram  02/18/2014   tibial vessel diseas bil.  [name] SPINE SURGERY    . tendon achillies lengthing and sesamoid      His Family History Is Significant For: Family History  Problem Relation Age of Onset  . Heart disease Mother   . Hypertension Mother   . Heart attack Mother   . Hypertension Father   . Diabetes Father   . Diabetes Son   . Heart disease Son   . Hypertension Son     His Social History Is Significant For: Social History   Social History  . Marital status: Married    Spouse name: N/A  . Number of children: N/A  . Years of education: college   Occupational History  . [employer]     Social History Main Topics  . Smoking status: Former Smoker    Years: 1.00    Types: Pipe    Quit date: 07/08/1977  . Smokeless tobacco: Never Used  . Alcohol use No  . Drug use: No  . Sexual activity: Not Asked   Other Topics Concern  . None   Social History Narrative   Drinks 3 44oz cokes a day     His Allergies Are:  Allergies  Allergen Reactions  . Codeine Nausea And Vomiting  . Tramadol Nausea Only  :   His Current Medications Are:  Outpatient Encounter Prescriptions as of 02/15/2017  Medication Sig  . aspirin EC 81 MG EC tablet Take 1 tablet (81 mg total) by mouth daily. (Patient taking  differently: Take 81 mg by mouth at bedtime. )  . b complex vitamins tablet Take 1 tablet by mouth daily.  . carvedilol (COREG) 3.125 MG tablet Take 1 tablet (3.125 mg total) by mouth 2 (two) times daily with a meal.  . clopidogrel (PLAVIX) 75 MG tablet Take 75 mg by mouth daily.   . Cyanocobalamin ([employer] B-12) 1500 MCG TBDP Take 1 tablet by mouth daily.   . DULoxetine (CYMBALTA) 60 MG capsule Take 60 mg by mouth 2 (two) times daily.  . furosemide (LASIX) 40 MG tablet TAKE ONE TABLET TWICE DAILY (Patient taking differently: TAKE ONE TABLET DAILY AT BEDTIME)  . glimepiride (AMARYL) 4 MG tablet  Take 4 mg by mouth daily with breakfast.  . levothyroxine (SYNTHROID, LEVOTHROID) 50 MCG tablet Take 50 mcg by mouth every morning.   [name] lisinopril (PRINIVIL,ZESTRIL) 5 MG tablet Take 1 tablet (5 mg total) by mouth 2 (two) times daily.  . midodrine (PROAMATINE) 5 MG tablet Take 5 mg by mouth daily as needed (WHEN bp LEVELS DROP).   . nitroGLYCERIN (NITROSTAT) 0.4 MG SL tablet Place 1 tablet (0.4 mg total) under the tongue every 5 (five) minutes as needed for chest pain.  . potassium chloride SA (K-DUR,KLOR-CON) 20 MEQ tablet Take 1 tablet (20 mEq total) by mouth daily. (Patient taking differently: Take 20 mEq by mouth daily. Take 1 tablet every other day)  . pramipexole (MIRAPEX) 0.25 MG tablet Take 0.25 mg by mouth at bedtime.  . pravastatin (PRAVACHOL) 40 MG tablet Take 40 mg by mouth every morning.   . tamsulosin (FLOMAX) 0.4 MG CAPS capsule Take 1 capsule by mouth every morning.  . Thiamine Mononitrate (VITAMIN B1 PO) Take 1,000 mg by mouth 2 (two) times daily.  [name] gabapentin (NEURONTIN) 600 MG tablet Take 600 mg by mouth 4 (four) times daily.    No facility-administered encounter medications on file as of 02/15/2017.   : Review of Systems:  Out of a complete 14 point review of systems, all are reviewed and negative with the exception of these symptoms as listed below:  Review of Systems  Neurological:        Patient states that he has had RLS for 30+ years. He feels that he has "restless body" now.  PCP prescribe Gabapentin but patient feels that it makes it worse. Patient has stopped the Gabapentin.     Objective:  Neurologic Exam  Physical Exam Physical Examination:   Vitals:   02/15/17 0943  BP: (!) 104/54  Pulse: 80  Resp: 18   General Examination: The patient is a very pleasant 71 y.o. male in no acute distress. He appears well-developed and well-nourished and well groomed.   HEENT: Normocephalic, atraumatic, pupils are equal, round and reactive to light and accommodation. S/p b/l cataract repairs, hearing aid on R, L one is broken. Extraocular tracking is good without limitation to gaze excursion or nystagmus noted. Normal smooth pursuit is noted. Hearing is grossly intact. Face is symmetric with normal facial animation and normal facial sensation. Speech is clear with no dysarthria noted. There is no hypophonia. There is no lip, neck/head, jaw or voice tremor. Neck is supple with full range of passive and active motion. There are no carotid bruits on auscultation. Oropharynx exam reveals: mild mouth dryness, adequate dental hygiene with full dentureand moderate airway crowding. Mallampati is class II. Tongue protrudes centrally and palate elevates symmetrically.   Chest: Clear to auscultation without wheezing, rhonchi or crackles noted.  Heart: S1+S2+0, regular and normal without murmurs, rubs or gallops noted.   Abdomen: Soft, non-tender and non-distended with normal bowel sounds appreciated on auscultation.  Extremities: There is no pitting edema in the distal lower extremities bilaterally. Pedal pulses are intact.  Skin: Warm and dry without trophic changes noted.  Musculoskeletal: exam reveals no obvious joint deformities, tenderness or joint swelling or erythema.   Neurologically:  Mental status: The patient is awake, alert and oriented in all 4 spheres. His immediate  and remote memory, attention, language skills and fund of knowledge are appropriate. There is no evidence of aphasia, agnosia, apraxia or anomia. Speech is clear with normal prosody and enunciation. Thought process is linear. Mood is  normal and affect is normal.  Cranial nerves II - XII are as described above under HEENT exam. In addition: shoulder shrug is normal with equal shoulder height noted. Motor exam: Normal bulk, strength and tone is noted. There is no drift, tremor or rebound. Reflexes are 1+ in the upper extremities, absent in the lower extremities. Toes are flexor bilaterally. Fine motor skills and coordination: intact with normal finger taps, normal hand movements, normal rapid alternating patting, normal foot taps and normal foot agility.  Cerebellar testing: No dysmetria or intention tremor on finger to nose testing. Heel to shin is unremarkable bilaterally. There is no truncal or gait ataxia.  Sensory exam: intact to light touch, pinprick, vibration, temperature sense in the upper extremities, some decrease in vibratory sense in the distal lower extremities.  Gait, station and balance: He stands with difficulty and has to push himself up. He stands wide-based. Romberg is not tested for safety. He walks slowly with a single-point cane. Tandem walk is not possible.  Assessment and Plan:  In summary, VALLIE TETERS is a very pleasant 71 y.o.-year old male with an underlying medical history of cardiomyopathy, carotid artery occlusion and status post left carotid endarterectomy, orthostatic hypotension, obesity, hypertension, reflux disease, low back pain, type 2 diabetes, history of stroke, hypothyroidism, peripheral vascular disease, status post back surgery and status post neck surgery, who Presents for initial consultation of his restless leg syndrome of many years duration. He reports worsening over time which is typical. He has been on Mirapex 0.25 mg each bedtime and try gabapentin which  was up to 600 mg 4 times a day and he stopped it abruptly. He strongly advised never to stop this medication abruptly. He would like to restart it. He is advised to start just at bedtime for now. Furthermore, would like to proceed with lead work at this time to look for iron deficiency or frank anemia as this could be a contributor to versus legs symptoms. In addition, he strongly advised to restart his CPAP for his obstructive sleep apnea. He was diagnosed with moderate obstructive sleep apnea in 2014. He may benefit from repeat testing at some point. He had severe PLMS with significant arousals during the baseline sleep study. I did not review his CPAP titration study. He has not used CPAP for at least 2 months and is advised about the risks and ramifications of untreated moderate to severe OSA, particularly with respect to cardiovascular disease long-term. I also advised him to reduce his caffeine intake as caffeine could be a contributor to restless leg syndrome flareup. He is advised to gradually reduce his caffeine intake and increase his water intake. I suggested we increase Mirapex to 0.5 mg each night for now. I provided written instructions and a new prescription. I will see him back in about 3 months, sooner as needed. We will call him with his blood test results in the interim. He does not indicate that he follows with a sleep specialist for his obstructive sleep apnea. He may benefit from reevaluation and adjustment of treatment if necessary and we will revisit this in the future. I answered all his questions today and he was in agreement with the plan. Thank you very much for allowing me to participate in the care of this nice patient. If I can be of any further assistance to you please do not hesitate to call me at [phone].  Sincerely,   Star Age, MD, PhD

## 2017-02-16 LAB — IRON AND TIBC
Iron Saturation: 34 % (ref 15–55)
Iron: 85 ug/dL (ref 38–169)
Total Iron Binding Capacity: 249 ug/dL — ABNORMAL LOW (ref 250–450)
UIBC: 164 ug/dL (ref 111–343)

## 2017-02-16 LAB — FERRITIN: Ferritin: 115 ng/mL (ref 30–400)

## 2017-02-16 LAB — CBC WITH DIFFERENTIAL/PLATELET
Basophils Absolute: 0 10*3/uL (ref 0.0–0.2)
Basos: 0 %
EOS (ABSOLUTE): 0.2 10*3/uL (ref 0.0–0.4)
Eos: 3 %
Hematocrit: 36.5 % — ABNORMAL LOW (ref 37.5–51.0)
Hemoglobin: 11.9 g/dL — ABNORMAL LOW (ref 13.0–17.7)
Immature Grans (Abs): 0 10*3/uL (ref 0.0–0.1)
Immature Granulocytes: 0 %
Lymphocytes Absolute: 1 10*3/uL (ref 0.7–3.1)
Lymphs: 15 %
MCH: 31.9 pg (ref 26.6–33.0)
MCHC: 32.6 g/dL (ref 31.5–35.7)
MCV: 98 fL — ABNORMAL HIGH (ref 79–97)
Monocytes Absolute: 0.4 10*3/uL (ref 0.1–0.9)
Monocytes: 6 %
Neutrophils Absolute: 5.1 10*3/uL (ref 1.4–7.0)
Neutrophils: 76 %
Platelets: 283 10*3/uL (ref 150–379)
RBC: 3.73 x10E6/uL — ABNORMAL LOW (ref 4.14–5.80)
RDW: 14.5 % (ref 12.3–15.4)
WBC: 6.7 10*3/uL (ref 3.4–10.8)

## 2017-02-16 NOTE — Progress Notes (Signed)
Please call pt:  Labs okay, mild decrease in hemoglobin and hematocrit indicating mild anemia, but labs stable, actually slightly better from 3 weeks prior and trending better compared to 9 mo ago. Best to keep FU with PCP for monitoring.  No other action required, proceed with increase in Mirapex, caffeine reduction as discussed and getting back on CPAP.  Star Age, MD, PhD [facility])

## 2017-02-17 ENCOUNTER — Telehealth: Payer: Self-pay

## 2017-02-17 NOTE — Telephone Encounter (Signed)
-----   Message from Star Age, MD sent at 02/16/2017  8:32 AM EDT ----- Please call pt:  Labs okay, mild decrease in hemoglobin and hematocrit indicating mild anemia, but labs stable, actually slightly better from 3 weeks prior and trending better compared to 9 mo ago. Best to keep FU with PCP for monitoring.  No other action required, proceed with increase in Mirapex, caffeine reduction as discussed and getting back on CPAP.  Star Age, MD, PhD [facility])

## 2017-02-17 NOTE — Telephone Encounter (Signed)
LM (per DPR) with results below. Left call back number for any further questions.

## 2017-02-21 ENCOUNTER — Ambulatory Visit (INDEPENDENT_AMBULATORY_CARE_PROVIDER_SITE_OTHER): Payer: PPO | Admitting: Otolaryngology

## 2017-02-21 DIAGNOSIS — H903 Sensorineural hearing loss, bilateral: Secondary | ICD-10-CM | POA: Diagnosis not present

## 2017-03-19 ENCOUNTER — Emergency Department (HOSPITAL_COMMUNITY)
Admission: EM | Admit: 2017-03-19 | Discharge: 2017-03-19 | Disposition: A | Discharge status: Home / Self Care | Payer: PPO | Attending: Emergency Medicine | Admitting: Emergency Medicine

## 2017-03-19 ENCOUNTER — Emergency Department (HOSPITAL_COMMUNITY): Payer: PPO

## 2017-03-19 ENCOUNTER — Encounter (HOSPITAL_COMMUNITY): Payer: Self-pay

## 2017-03-19 DIAGNOSIS — Y929 Unspecified place or not applicable: Secondary | ICD-10-CM | POA: Insufficient documentation

## 2017-03-19 DIAGNOSIS — W010XXA Fall on same level from slipping, tripping and stumbling without subsequent striking against object, initial encounter: Secondary | ICD-10-CM | POA: Diagnosis not present

## 2017-03-19 DIAGNOSIS — S62635A Displaced fracture of distal phalanx of left ring finger, initial encounter for closed fracture: Secondary | ICD-10-CM | POA: Diagnosis not present

## 2017-03-19 DIAGNOSIS — S60418A Abrasion of other finger, initial encounter: Secondary | ICD-10-CM | POA: Diagnosis not present

## 2017-03-19 DIAGNOSIS — E1151 Type 2 diabetes mellitus with diabetic peripheral angiopathy without gangrene: Secondary | ICD-10-CM | POA: Insufficient documentation

## 2017-03-19 DIAGNOSIS — I10 Essential (primary) hypertension: Secondary | ICD-10-CM | POA: Insufficient documentation

## 2017-03-19 DIAGNOSIS — Z7982 Long term (current) use of aspirin: Secondary | ICD-10-CM | POA: Diagnosis not present

## 2017-03-19 DIAGNOSIS — Y939 Activity, unspecified: Secondary | ICD-10-CM | POA: Insufficient documentation

## 2017-03-19 DIAGNOSIS — Z79899 Other long term (current) drug therapy: Secondary | ICD-10-CM | POA: Insufficient documentation

## 2017-03-19 DIAGNOSIS — E039 Hypothyroidism, unspecified: Secondary | ICD-10-CM | POA: Insufficient documentation

## 2017-03-19 DIAGNOSIS — S60415A Abrasion of left ring finger, initial encounter: Secondary | ICD-10-CM | POA: Diagnosis not present

## 2017-03-19 DIAGNOSIS — M25511 Pain in right shoulder: Secondary | ICD-10-CM | POA: Insufficient documentation

## 2017-03-19 DIAGNOSIS — Z7902 Long term (current) use of antithrombotics/antiplatelets: Secondary | ICD-10-CM | POA: Diagnosis not present

## 2017-03-19 DIAGNOSIS — Y999 Unspecified external cause status: Secondary | ICD-10-CM | POA: Insufficient documentation

## 2017-03-19 DIAGNOSIS — Z87828 Personal history of other (healed) physical injury and trauma: Secondary | ICD-10-CM

## 2017-03-19 DIAGNOSIS — S62645A Nondisplaced fracture of proximal phalanx of left ring finger, initial encounter for closed fracture: Secondary | ICD-10-CM | POA: Diagnosis not present

## 2017-03-19 DIAGNOSIS — S62665A Nondisplaced fracture of distal phalanx of left ring finger, initial encounter for closed fracture: Secondary | ICD-10-CM | POA: Diagnosis not present

## 2017-03-19 DIAGNOSIS — Z87891 Personal history of nicotine dependence: Secondary | ICD-10-CM | POA: Insufficient documentation

## 2017-03-19 DIAGNOSIS — S60419A Abrasion of unspecified finger, initial encounter: Secondary | ICD-10-CM

## 2017-03-19 DIAGNOSIS — Z8739 Personal history of other diseases of the musculoskeletal system and connective tissue: Secondary | ICD-10-CM

## 2017-03-19 DIAGNOSIS — S6992XA Unspecified injury of left wrist, hand and finger(s), initial encounter: Secondary | ICD-10-CM | POA: Diagnosis not present

## 2017-03-19 MED ORDER — BACITRACIN ZINC 500 UNIT/GM EX OINT
TOPICAL_OINTMENT | Freq: Once | CUTANEOUS | Status: AC
  Administered 2017-03-19: 17:00:00 via TOPICAL
  Filled 2017-03-19: qty 0.9

## 2017-03-19 NOTE — Discharge Instructions (Signed)
Ice and elevate your finger to help improve pain and swelling.  You may take tylenol if needed for pain.  Wear the splint to protect your finger at all times.

## 2017-03-19 NOTE — ED Provider Notes (Signed)
[place] [facility] DEPT Provider Note   CSN: [id number] Arrival date & time: 03/19/17  1342     History   Chief Complaint Chief Complaint  Patient presents with  . Fall    HPI [name] is a 71 y.o. male with past medical history significant for DM, HTN, PAD, and history of cva presenting for evaluation of fall which occurred yesterday.  He reports occasional falls which he he blames on his restless leg syndrome but believes yesterdays event occurred when he stumbled over his cane.  He fell outdoors, landing on pavement and dislocated his left ring finger (at the middle joint) which he relocated without difficulty, also with report of right shoulder pain.  He states he has more pain and swelling in his ring finger today and has been unable to remove his wedding ring.  He denies head injury, dizziness, headache, back or neck pain.  He has had no n/v or new focal weakness since the fall.   HPI  Past Medical History:  Diagnosis Date  . Arthritis   . Cardiomyopathy- EF NL 01/2013, now 30-35% echo 03/14/2012  . Carotid artery occlusion    left s/p CEA  . Diabetes mellitus   . Hypertension   . Hypothyroidism   . Obstructive sleep apnea   . Orthostatic hypotension   . Peripheral arterial disease ([place]), s/p PTA x 2 RLE    nonhealing ulcers bilaterally on each great toe  . PONV (postoperative nausea and vomiting)   . Restless leg syndrome   . Shortness of breath   . Stroke [facility]) March 08, 2012    Patient Active Problem List   Diagnosis Date Noted  . Orthostatic hypotension 08/25/2016  . Arrhythmia 05/20/2016  . Angina decubitus ([place]) 05/19/2016  . Congestive heart failure ([place]) 05/17/2016  . Pain in the chest 05/17/2016  . Pulmonary nodule 05/17/2016  . CAP (community acquired pneumonia) 04/11/2016  . Elevated troponin 04/11/2016  . Nonhealing skin ulcer ([place]) 10/28/2014  . Bilateral carotid artery disease ([place]) 07/08/2014  . Critical lower limb ischemia 02/28/2014  .  Obstructive sleep apnea 02/12/2014  . Aftercare following surgery of the circulatory system, [place] 12/04/2013  . Restless leg syndrome 10/23/2013  . Obesity (BMI 30.0-34.9) 10/23/2013  . Hyperlipidemia 07/16/2013  . Peripheral arterial disease ([place]) 07/16/2013  . History of stroke June 2013 03/14/2012  . Hypertension 03/14/2012  . Type 2 diabetes mellitus with circulatory disorder ([place]) 03/14/2012  . Peripheral neuropathy 03/14/2012  . Cardiomyopathy- EF NL 01/2013, now 30-35% echo 03/14/2012  . ARTHRITIS, RIGHT FOOT 06/26/2008    Past Surgical History:  Procedure Laterality Date  . ANGIOPLASTY  02/28/14   diamond back orbital rotational atherectomy of Rt. tibial  . BACK SURGERY    . CARDIAC CATHETERIZATION N/A 05/19/2016   Procedure: Right/Left Heart Cath and Coronary Angiography;  Surgeon: Belva Crome, MD;  Location: [place] CV LAB;  Service: Cardiovascular;  Laterality: N/A;  . CERVICAL FUSION    . ENDARTERECTOMY Left 11/29/2013   Procedure: ENDARTERECTOMY CAROTID;  Surgeon: Serafina Mitchell, MD;  Location: [place];  Service: Vascular;  Laterality: Left;  . EYE SURGERY    . FOOT SURGERY    . Lower ext duplex doppler  03/14/14   Rt ABI 1.2  . LOWER EXTREMITY ANGIOGRAM Bilateral 02/18/2014   Procedure: LOWER EXTREMITY ANGIOGRAM;  Surgeon: [name], MD;  Location: [facility] C[facility] LAB;  Service: Cardiovascular;  Laterality: Bilateral;  . LOWER EXTREMITY ANGIOGRAM N/A 10/31/2014   Procedure:  LOWER EXTREMITY ANGIOGRAM;  Surgeon: [name], MD;  Location: [facility] CATH LAB;  Service: Cardiovascular;  Laterality: N/A;  . PV angiogram  02/18/2014   tibial vessel diseas bil.  [name] SPINE SURGERY    . tendon achillies lengthing and sesamoid         Home Medications    Prior to Admission medications   Medication Sig Start Date End Date Taking? Authorizing Provider  aspirin EC 81 MG EC tablet Take 1 tablet (81 mg total) by mouth daily. Patient taking differently: Take 81 mg by mouth at  bedtime.  05/21/16   Barrett, Evelene Croon, PA-C  b complex vitamins tablet Take 1 tablet by mouth daily.    [provider]  carvedilol (COREG) 3.125 MG tablet Take 1 tablet (3.125 mg total) by mouth 2 (two) times daily with a meal. 05/21/16   Barrett, Evelene Croon, PA-C  clopidogrel (PLAVIX) 75 MG tablet Take 75 mg by mouth daily.  06/16/13   [provider]  Cyanocobalamin ([facility] B-12) 1500 MCG TBDP Take 1 tablet by mouth daily.     [provider]  DULoxetine (CYMBALTA) 60 MG capsule Take 60 mg by mouth 2 (two) times daily. 01/20/17   [provider]  furosemide (LASIX) 40 MG tablet TAKE ONE TABLET TWICE DAILY Patient taking differently: TAKE ONE TABLET DAILY AT BEDTIME 01/17/17   [name], MD  gabapentin (NEURONTIN) 600 MG tablet Take 600 mg by mouth 4 (four) times daily.     [provider]  glimepiride (AMARYL) 4 MG tablet Take 4 mg by mouth daily with breakfast.    [provider]  levothyroxine (SYNTHROID, LEVOTHROID) 50 MCG tablet Take 50 mcg by mouth every morning.  06/08/13   [provider]  lisinopril (PRINIVIL,ZESTRIL) 5 MG tablet Take 1 tablet (5 mg total) by mouth 2 (two) times daily. 11/25/16 02/23/17  [name], MD  midodrine (PROAMATINE) 5 MG tablet Take 5 mg by mouth daily as needed (WHEN bp LEVELS DROP).     [provider]  nitroGLYCERIN (NITROSTAT) 0.4 MG SL tablet Place 1 tablet (0.4 mg total) under the tongue every 5 (five) minutes as needed for chest pain. 05/22/16   Barrett, Evelene Croon, PA-C  potassium chloride SA (K-DUR,KLOR-CON) 20 MEQ tablet Take 1 tablet (20 mEq total) by mouth daily. Patient taking differently: Take 20 mEq by mouth daily. Take 1 tablet every other day 05/21/16   Barrett, Evelene Croon, PA-C  pramipexole (MIRAPEX) 0.25 MG tablet Take 2 tablets (0.5 mg total) by mouth at bedtime. 02/15/17   Star Age, MD  pravastatin (PRAVACHOL) 40 MG tablet Take 40 mg by mouth every morning.  10/06/12   [provider]  tamsulosin (FLOMAX) 0.4 MG CAPS capsule Take 1 capsule by mouth every morning. 01/21/17   [provider]  Thiamine Mononitrate (VITAMIN B1 PO) Take 1,000 mg by mouth 2 (two) times daily.    [provider]    Family History Family History  Problem Relation Age of Onset  . Heart disease Mother   . Hypertension Mother   . Heart attack Mother   . Hypertension Father   . Diabetes Father   . Diabetes Son   . Heart disease Son   . Hypertension Son     Social History Social History  Substance Use Topics  . Smoking status: Former Smoker    Years: 1.00    Types: Pipe    Quit date: 07/08/1977  . Smokeless  tobacco: Never Used  . Alcohol use No     Allergies   Codeine and Tramadol   Review of Systems Review of Systems  Constitutional: Negative for fever.  Musculoskeletal: Positive for arthralgias. Negative for joint swelling and myalgias.  Skin: Positive for wound.  Neurological: Negative for weakness and numbness.     Physical Exam Updated Vital Signs BP (!) 145/71 (BP Location: Right Arm)   Pulse 94   Temp 98.2 F (36.8 C) (Oral)   Resp 18   Ht 6' (1.829 m)   Wt 103.9 kg (229 lb)   SpO2 100%   BMI 31.06 kg/m   Physical Exam  Constitutional: He appears well-developed and well-nourished.  HENT:  Head: Atraumatic.  Neck: Normal range of motion.  Cardiovascular:  Pulses equal bilaterally  Musculoskeletal: He exhibits edema and tenderness.       Right shoulder: He exhibits bony tenderness. He exhibits no swelling, no deformity, no laceration, normal pulse and normal strength.       Left hand: He exhibits swelling. He exhibits no deformity. Normal sensation noted. Normal strength noted.  ttp with bruising and edema, dusky appearance noted distal to the proximal ring of his left ring finger. Superficial avulsion to the outer layer of skin left long finger tip. No bleeding.  Nail plate intact.     Neurological: He is alert. He has  normal strength. He displays normal reflexes. No sensory deficit.  Skin: Skin is warm and dry.  Psychiatric: He has a normal mood and affect.     ED Treatments / Results  Labs (all labs ordered are listed, but only abnormal results are displayed) Labs Reviewed - No data to display  EKG  EKG Interpretation None       Radiology Dg Shoulder Right  Result Date: 03/19/2017 CLINICAL DATA:  Fall yesterday. Right shoulder pain. Initial encounter. EXAM: RIGHT SHOULDER - 2+ VIEW COMPARISON:  03/13/2010 FINDINGS: There is no evidence of fracture or dislocation. Mild degenerative spurring again seen involving the [facility] joint and inferior glenohumeral joint. Generalized osteopenia noted. No other osseous abnormality identified. IMPRESSION: No acute findings. Mild acromioclavicular and glenohumeral DJD. Electronically Signed   By: Earle Gell M.D.   On: 03/19/2017 15:52   Dg Hand Complete Left  Result Date: 03/19/2017 CLINICAL DATA:  Fall yesterday. Left ring finger pain and swelling. Initial encounter. EXAM: LEFT HAND - COMPLETE 3+ VIEW COMPARISON:  None. FINDINGS: A tiny avulsion fracture is seen from the dorsal lip of the distal phalanx of the ring finger, adjacent to the distal interphalangeal joint. No other acute fractures identified. No evidence of dislocation. Mild degenerative spurring is seen involving the distal interphalangeal joints of the other digits. IMPRESSION: Tiny dorsal plate avulsion fracture from the distal phalanx of the ring finger. Electronically Signed   By: Earle Gell M.D.   On: 03/19/2017 15:54    Procedures .Foreign Body Removal Date/Time: 03/19/2017 4:13 PM Performed by: Evalee Jefferson Authorized by: Evalee Jefferson  Consent: Verbal consent obtained. Risks and benefits: risks, benefits and alternatives were discussed Consent given by: patient Patient identity confirmed: verbally with patient Time out: Immediately prior to procedure a "time out" was called to verify the  correct patient, procedure, equipment, support staff and site/side marked as required. Body area: skin Tendon involvement: none Complexity: simple 1 objects recovered. Objects recovered: ring Comments: Ring removed using dental floss to wrap finger and slide the ring.  Post removal - distal sensation intact, pink coloration to finger tip with  less than 2 sec cap refill.  Generalized bruising noted most prominent at dorsal dip joint.  Pt can flex/extend finger without difficulty.   (including critical care time)    Medications Ordered in ED Medications  bacitracin ointment (not administered)     Initial Impression / Assessment and Plan / ED Course  I have reviewed the triage vital signs and the nursing notes.  Pertinent labs & imaging results that were available during my care of the patient were reviewed by me and considered in my medical decision making (see chart for details).     Pt avulsion fx of distal left ring finger.  Finger splint provided.  Ice, elevation, f/u with Dr. Amedeo Plenty for  Recheck of finger injury. Pt to call for appt. Time.  He is current with his tetanus.   Final Clinical Impressions(s) / ED Diagnoses   Final diagnoses:  Closed nondisplaced fracture of distal phalanx of left ring finger, initial encounter  Abrasion of finger, initial encounter  History of closed dislocation of finger    New Prescriptions New Prescriptions   No medications on file     Landis Martins 03/19/17 [place], [place], DO 03/27/17 1104

## 2017-03-19 NOTE — ED Triage Notes (Addendum)
Pt reports that he fell yesterday, not sure if he fell due to restless leg syndrome or caught on cane. Pt reports pain in left wring finger and right shoulder pain. Left ring finger swollen and nurse attempting. Pt reports pulling his finger back into place yesterday when he fell

## 2017-03-22 ENCOUNTER — Ambulatory Visit (INDEPENDENT_AMBULATORY_CARE_PROVIDER_SITE_OTHER): Payer: PPO | Admitting: Cardiovascular Disease

## 2017-03-22 ENCOUNTER — Encounter: Payer: Self-pay | Admitting: Cardiovascular Disease

## 2017-03-22 VITALS — BP 108/70 | HR 86 | Ht 72.0 in | Wt 242.0 lb

## 2017-03-22 DIAGNOSIS — I739 Peripheral vascular disease, unspecified: Secondary | ICD-10-CM

## 2017-03-22 DIAGNOSIS — I10 Essential (primary) hypertension: Secondary | ICD-10-CM | POA: Diagnosis not present

## 2017-03-22 DIAGNOSIS — I779 Disorder of arteries and arterioles, unspecified: Secondary | ICD-10-CM | POA: Diagnosis not present

## 2017-03-22 NOTE — Progress Notes (Signed)
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 He now works at [employer]. He was referred by Dr. [name] from [facility] for peripheral vasodilation because of nonhealing small ulcers on the dorsal surface of his great toes bilaterally. I last saw him in the office 08/25/16. His cardiovascular risk factor profile is remarkable for treated hypertension, diabetes and hyperlipidemia. He has had a stroke 03/08/12 and has carotid disease followed by Dr. [name] ,. He denies chest pain, shortness of breath or claudication. He has had 2 small ulcers on the dorsal surface of both great toes for 6-9 months which have been slow to heal. Since I saw him last in November he has had an elective left carotid endarterectomy performed by Dr. [name] . He was also placed on CPAP because of obstructive sleep apnea which he is benefiting from. He is compliant with his CPAP.Marland KitchenHe has developed a nonhealing wound on his right great toe which had been fairly rapidly progressing and he sent back for further evaluation. I performed lower extremity Doppler studies last October suggesting tibial vessel disease..I performed angiography on him 03/19/14 demonstrating severe tibial disease and ultimately performed diamondback orbital rotational atherectomy of his right posterior tibial establishing excellent in-line flow. This resulted in ultimate healing of his right great toe ischemic ulcer, improvement in his symptoms and Dopplers as well. He has since developed an ischemic ulcer on the plantar surface of the medial aspect of his right foot as a result of trauma and is being treated by his podiatrist, Dr. Barkley Bruns.follow-up Dopplers post  intervention revealed an increase in his right ABI to 1.2 however, subsequent Dopplers performed 07/02/14 revealed a decrease in his right eye twice a day 0.75 with occlusion of his posterior tibial artery. Because of progression of the ulcer on the dorsal surface of his right heel 3 angina gram him 10/31/14 revealing a subtotally occluded right posterior tibial artery which I re-intervened on with an excellent clinical management result. His subsequent follow-up Dopplers performed 11/08/14 revealed an increase in his right ABI from 0.75 Up to 1.2. His ulcer is slowly healing. Since I saw him a year ago he's remained stable. His most recent lower extremity arterial Doppler studies performed 02/24/16 revealed normal ABIs bilaterally. Since I saw him 6 months ago he was admitted with congestive heart failure and 05/16/16 and diuresis. His EF was 30-35% by 2-D echo. He underwent right and left heart cath by Dr. Tamala Julian revealing an elevated LVEDP and moderate diffuse CAD. Medical therapy was recommended. Since I saw him in the office 6 months ago he's remained fairly stable. He is aware some restriction. He is medically compliant. He denies chest pain or shortness of breath. He has no new wounds on his feet.    Current Outpatient Prescriptions  Medication Sig Dispense Refill  . aspirin EC 81 MG EC tablet Take 1 tablet (81 mg total) by mouth daily. (Patient taking differently: Take 81 mg by mouth at bedtime. )    . b complex vitamins tablet Take 1 tablet by mouth daily.    . carvedilol (COREG) 3.125 MG tablet Take 1 tablet (3.125 mg total) by mouth 2 (two) times daily with a meal. 60 tablet 11  .  clopidogrel (PLAVIX) 75 MG tablet Take 75 mg by mouth daily.     . Cyanocobalamin ([employer]S B-12) 1500 MCG TBDP Take 1 tablet by mouth daily.     . DULoxetine (CYMBALTA) 60 MG capsule Take 60 mg by mouth 2 (two) times daily.    . furosemide (LASIX) 40 MG tablet TAKE ONE TABLET TWICE DAILY (Patient taking differently: TAKE  ONE TABLET DAILY AT BEDTIME) 60 tablet 11  . gabapentin (NEURONTIN) 600 MG tablet Take 600 mg by mouth 4 (four) times daily.     [name] glimepiride (AMARYL) 4 MG tablet Take 4 mg by mouth daily with breakfast.    . levothyroxine (SYNTHROID, LEVOTHROID) 50 MCG tablet Take 50 mcg by mouth every morning.     . midodrine (PROAMATINE) 5 MG tablet Take 5 mg by mouth daily as needed (WHEN bp LEVELS DROP).     . nitroGLYCERIN (NITROSTAT) 0.4 MG SL tablet Place 1 tablet (0.4 mg total) under the tongue every 5 (five) minutes as needed for chest pain. 25 tablet 3  . potassium chloride SA (K-DUR,KLOR-CON) 20 MEQ tablet Take 1 tablet (20 mEq total) by mouth daily. (Patient taking differently: Take 20 mEq by mouth daily. Take 1 tablet every other day) 30 tablet 11  . pramipexole (MIRAPEX) 0.25 MG tablet Take 2 tablets (0.5 mg total) by mouth at bedtime. 60 tablet 5  . pravastatin (PRAVACHOL) 40 MG tablet Take 40 mg by mouth every morning.     . tamsulosin (FLOMAX) 0.4 MG CAPS capsule Take 1 capsule by mouth every morning.    . Thiamine Mononitrate (VITAMIN B1 PO) Take 1,000 mg by mouth 2 (two) times daily.    [name] lisinopril (PRINIVIL,ZESTRIL) 5 MG tablet Take 1 tablet (5 mg total) by mouth 2 (two) times daily. 180 tablet 1   No current facility-administered medications for this visit.     Allergies  Allergen Reactions  . Codeine Nausea And Vomiting  . Tramadol Nausea Only    Social History   Social History  . Marital status: Married    Spouse name: N/A  . Number of children: N/A  . Years of education: college   Occupational History  . [employer]     Social History Main Topics  . Smoking status: Former Smoker    Years: 1.00    Types: Pipe    Quit date: 07/08/1977  . Smokeless tobacco: Never Used  . Alcohol use No  . Drug use: No  . Sexual activity: Not on file   Other Topics Concern  . Not on file   Social History Narrative   Drinks 3 44oz cokes a day      Review of Systems: General:  negative for chills, fever, night sweats or weight changes.  Cardiovascular: negative for chest pain, dyspnea on exertion, edema, orthopnea, palpitations, paroxysmal nocturnal dyspnea or shortness of breath Dermatological: negative for rash Respiratory: negative for cough or wheezing Urologic: negative for hematuria Abdominal: negative for nausea, vomiting, diarrhea, bright red blood per rectum, melena, or hematemesis Neurologic: negative for visual changes, syncope, or dizziness All other systems reviewed and are otherwise negative except as noted above.    Blood pressure 108/70, pulse 86, height 6' (1.829 m), weight 242 lb (109.8 kg), SpO2 95 %.  General appearance: alert and no distress Neck: no adenopathy, no carotid bruit, no JVD, supple, symmetrical, trachea midline and thyroid not enlarged, symmetric, no tenderness/mass/nodules Lungs: clear to auscultation bilaterally Heart: regular rate and rhythm, S1, S2 normal, no murmur,  click, rub or gallop Extremities: extremities normal, atraumatic, no cyanosis or edema  EKG not performed today  ASSESSMENT AND PLAN:   Hypertension History of essential hypertension blood pressure measured 108/70. He is on carvedilol and lisinopril. Continue current meds at current dosing.  Cardiomyopathy- EF NL 01/2013, now 30-35% echo History of mixed cardiomyopathy with EF less than 30% range and moderate, scattered but noncritical CAD by cardiac catheterization performed by Dr. Tamala Julian 05/19/16.  Hyperlipidemia History of hyperlipidemia on statin therapy followed by his PCP  Peripheral arterial disease ([place]) History of peripheral arterial disease status post several lower extremity vascularization procedures by myself for critical limb ischemia previous wound ultimately healed. His last Doppler studies performed 02/24/16 revealed normal ABIs bilaterally with patent tibial vessels. We will recheck lower extremity are chilled Doppler studies.  Bilateral  carotid artery disease ([facility]) History of carotid artery disease status post elective left carotid endarterectomy performed by Dr. [name] which she follows.      [name] MD [place], [facility] 03/22/2017 3:54 PM

## 2017-03-22 NOTE — Assessment & Plan Note (Signed)
History of mixed cardiomyopathy with EF less than 30% range and moderate, scattered but noncritical CAD by cardiac catheterization performed by Dr. Tamala Julian 05/19/16.

## 2017-03-22 NOTE — Assessment & Plan Note (Signed)
History of essential hypertension blood pressure measured 108/70. He is on carvedilol and lisinopril. Continue current meds at current dosing.

## 2017-03-22 NOTE — Patient Instructions (Signed)
Medication Instructions: Your physician recommends that you continue on your current medications as directed. Please refer to the Current Medication list given to you today.   Testing/Procedures: Your physician has requested that you have a lower extremity arterial duplex. During this test, ultrasound is used to evaluate arterial blood flow in the legs. Allow one hour for this exam. There are no restrictions or special instructions.  Your physician has requested that you have an ankle brachial index (ABI). During this test an ultrasound and blood pressure cuff are used to evaluate the arteries that supply the arms and legs with blood. Allow thirty minutes for this exam. There are no restrictions or special instructions.  (Within the next 2 weeks)    Follow-Up: Your physician wants you to follow-up in: 1 year with Dr. Gwenlyn Found. You will receive a reminder letter in the mail two months in advance. If you don't receive a letter, please call our office to schedule the follow-up appointment.  If you need a refill on your cardiac medications before your next appointment, please call your pharmacy.

## 2017-03-22 NOTE — Assessment & Plan Note (Signed)
History of peripheral arterial disease status post several lower extremity vascularization procedures by myself for critical limb ischemia previous wound ultimately healed. His last Doppler studies performed 02/24/16 revealed normal ABIs bilaterally with patent tibial vessels. We will recheck lower extremity are chilled Doppler studies.

## 2017-03-22 NOTE — Assessment & Plan Note (Signed)
History of hyperlipidemia on statin therapy followed by his PCP 

## 2017-03-22 NOTE — Assessment & Plan Note (Signed)
History of carotid artery disease status post elective left carotid endarterectomy performed by Dr. [name] which she follows.

## 2017-04-12 ENCOUNTER — Encounter (HOSPITAL_COMMUNITY): Payer: PPO

## 2017-04-12 ENCOUNTER — Ambulatory Visit (HOSPITAL_COMMUNITY)
Admission: RE | Admit: 2017-04-12 | Discharge: 2017-04-12 | Disposition: A | Discharge status: Home / Self Care | Payer: PPO | Source: Ambulatory Visit | Attending: Internal Medicine | Admitting: Internal Medicine

## 2017-04-12 DIAGNOSIS — I739 Peripheral vascular disease, unspecified: Secondary | ICD-10-CM

## 2017-04-14 ENCOUNTER — Other Ambulatory Visit: Payer: Self-pay | Admitting: Cardiovascular Disease

## 2017-04-14 DIAGNOSIS — I739 Peripheral vascular disease, unspecified: Secondary | ICD-10-CM

## 2017-04-16 ENCOUNTER — Other Ambulatory Visit: Payer: Self-pay | Admitting: Cardiovascular Disease

## 2017-04-18 ENCOUNTER — Other Ambulatory Visit: Payer: Self-pay | Admitting: *Deleted

## 2017-04-18 MED ORDER — POTASSIUM CHLORIDE CRYS ER 20 MEQ PO TBCR: 20.0000 meq | EXTENDED_RELEASE_TABLET | Freq: Every day | ORAL | 3 refills | Status: DC

## 2017-04-18 MED ORDER — FUROSEMIDE 40 MG PO TABS: 40.0000 mg | ORAL_TABLET | Freq: Two times a day (BID) | ORAL | 3 refills | Status: DC

## 2017-05-18 ENCOUNTER — Ambulatory Visit (INDEPENDENT_AMBULATORY_CARE_PROVIDER_SITE_OTHER): Payer: PPO | Admitting: Neurology

## 2017-05-18 ENCOUNTER — Encounter: Payer: Self-pay | Admitting: Neurology

## 2017-05-18 DIAGNOSIS — G4761 Periodic limb movement disorder: Secondary | ICD-10-CM

## 2017-05-18 DIAGNOSIS — Z789 Other specified health status: Secondary | ICD-10-CM

## 2017-05-18 DIAGNOSIS — G2581 Restless legs syndrome: Secondary | ICD-10-CM | POA: Diagnosis not present

## 2017-05-18 DIAGNOSIS — G4733 Obstructive sleep apnea (adult) (pediatric): Secondary | ICD-10-CM | POA: Diagnosis not present

## 2017-05-18 MED ORDER — PRAMIPEXOLE DIHYDROCHLORIDE 0.25 MG PO TABS: ORAL_TABLET | ORAL | 3 refills | Status: DC

## 2017-05-18 NOTE — Progress Notes (Signed)
Subjective:    Patient ID: [name] is a 71 y.o. male.  HPI     Interim history:   [name] is a 71 year old left-handed gentleman with an underlying medical history of cardiomyopathy, carotid artery occlusion and status post left carotid endarterectomy, orthostatic hypotension, obesity, hypertension, reflux disease, low back pain, type 2 diabetes, history of stroke, hypothyroidism, peripheral vascular disease, status post back surgery and status post neck surgery, who presents for follow-up consultation of his restless leg syndrome of many years. The patient is unaccompanied today. I first met him on 02/15/2017 at the request of his primary care physician, at which time he reported a likely 30 year plus history of restless leg syndrome. He had been on high-dose gabapentin but stopped it on his own. He was on low-dose Mirapex and I suggested we increase this. He was advised to go back on his CPAP for sleep apnea. He was also advised to reduce his caffeine intake.  Today, 05/18/2017 (all dictated new, as well as above notes, some dictation done in note pad or Word, outside of chart, may appear as copied):  He reports that the Mirapex helps only temporarily. He is on gabapentin, currently 600 mg at bedtime. He feels like one of his medications actually exacerbates his restless leg symptoms. He tries to take his Mirapex about 2 hours before bedtime, currently bedtime tends to be around 11 although he would like to go to bed earlier. His wife likes to go to bed late. He is able to tolerate the Mirapex at the current dose of 0.5 mg each night. He would be willing to increase this. Lower extremity swelling is about the same, he takes Lasix and also weighs himself on a regular basis to keep an eye on fluid retention. He reports that he decreased his caffeine intake. He has used his CPAP some but is waiting for the CPAP machine cleaner to come and so he can start his CPAP more consistently. He is  motivated to do that.  Previously (copied from previous notes for reference):   02/15/2017: (He) reports restless legs symptoms for the past many years, likely over 30 years. I reviewed your office note from 01/26/2017, which you kindly included. He has been on Mirapex 0.25 mg strength for the past months, maybe since 12/17. He is also Cymbalta 60 mg twice daily. He presented to the emergency room recently on 01/25/2017 for a near syncopal event. I reviewed the emergency room records. Symptoms were suspected to be secondary to volume depletion and he improved subjectively with fluids. He was recently tried on gabapentin, but felt worse and stopped it. His diabetic PN symptoms are worse since he stopped it, he was on 600 mg 4 times a day and stopped it all at once - I advised him never to stop this med "cold [place]".  He is a non-smoker, does not currently drinking alcohol currently, but drinks a significant amount of caffeine in the form of soda/cola, ("lots") 3 x 44 oz per day, by self report. He reports feeling restless in his body.  He has not used his CPAP in over 2 months, waiting for a cleaning machine for the CPAP. He had sleep study testing last year, he believes, but then recalls it was in 2014. I reviewed His sleep study report from 08/03/2013. He had a baseline sleep study at the [facility]. Sleep efficiency was only 60%, REM latency was 99 minutes, overall AHI was 26 per hour,  average oxygen saturation was 90%, nadir was 77% during REM sleep. He had severe PLMS with an index of 127.7 per hour, PLM arousal index was elevated at 52.9 per hour. He recalls that he went back for a second sleep study for CPAP titration. Of note, he has noticed worsening of RLS symptoms since coming off of the CPAP. He has severe nocturia, about 5 times per night, takes his diuretic at night, although he is supposed to take it in the morning. He has never seen a urologist. He works at [employer],  outside Chief Financial Officer, on the road a lot, therefore he does not like to take his diuretic during the day as he does not have access to bathrooms frequently. His bedtime is around 10 PM, he tries to take his restless legs medication an hour before. Wake up time is around 9 AM.   His Past Medical History Is Significant For: Past Medical History:  Diagnosis Date  . Arthritis   . Cardiomyopathy- EF NL 01/2013, now 30-35% echo 03/14/2012  . Carotid artery occlusion    left s/p CEA  . Diabetes mellitus   . Hypertension   . Hypothyroidism   . Obstructive sleep apnea   . Orthostatic hypotension   . Peripheral arterial disease (HCC), s/p PTA x 2 RLE    nonhealing ulcers bilaterally on each great toe  . PONV (postoperative nausea and vomiting)   . Restless leg syndrome   . Shortness of breath   . Stroke [facility]) March 08, 2012    His Past Surgical History Is Significant For: Past Surgical History:  Procedure Laterality Date  . ANGIOPLASTY  02/28/14   diamond back orbital rotational atherectomy of Rt. tibial  . BACK SURGERY    . CARDIAC CATHETERIZATION N/A 05/19/2016   Procedure: Right/Left Heart Cath and Coronary Angiography;  Surgeon: [name], MD;  Location: [facility] INVASIVE CV LAB;  Service: Cardiovascular;  Laterality: N/A;  . CERVICAL FUSION    . ENDARTERECTOMY Left 11/29/2013   Procedure: ENDARTERECTOMY CAROTID;  Surgeon: [name], MD;  Location: [facility] OR;  Service: Vascular;  Laterality: Left;  . EYE SURGERY    . FOOT SURGERY    . Lower ext duplex doppler  03/14/14   Rt ABI 1.2  . LOWER EXTREMITY ANGIOGRAM Bilateral 02/18/2014   Procedure: LOWER EXTREMITY ANGIOGRAM;  Surgeon: Runell Gess, MD;  Location: [facility] CATH LAB;  Service: Cardiovascular;  Laterality: Bilateral;  . LOWER EXTREMITY ANGIOGRAM N/A 10/31/2014   Procedure: LOWER EXTREMITY ANGIOGRAM;  Surgeon: Runell Gess, MD;  Location: [facility] CATH LAB;  Service: Cardiovascular;  Laterality: N/A;  . PV angiogram  02/18/2014   tibial  vessel diseas bil.  [name] SPINE SURGERY    . tendon achillies lengthing and sesamoid      His Family History Is Significant For: Family History  Problem Relation Age of Onset  . Heart disease Mother   . Hypertension Mother   . Heart attack Mother   . Hypertension Father   . Diabetes Father   . Diabetes Son   . Heart disease Son   . Hypertension Son     His Social History Is Significant [id number] Social History   Social History  . Marital status: Married    Spouse name: N/A  . Number of children: N/A  . Years of education: college   Occupational History  . [employer]     Social History Main Topics  . Smoking status: Former Smoker    Years:  1.00    Types: Pipe    Quit date: 07/08/1977  . Smokeless tobacco: Never Used  . Alcohol use No  . Drug use: No  . Sexual activity: Not Asked   Other Topics Concern  . None   Social History Narrative   Drinks 3 44oz cokes a day     His Allergies Are:  Allergies  Allergen Reactions  . Codeine Nausea And Vomiting  . Tramadol Nausea Only  :   His Current Medications Are:  Outpatient Encounter Prescriptions as of 05/18/2017  Medication Sig  . aspirin EC 81 MG EC tablet Take 1 tablet (81 mg total) by mouth daily. (Patient taking differently: Take 81 mg by mouth at bedtime. )  . b complex vitamins tablet Take 1 tablet by mouth daily.  . carvedilol (COREG) 3.125 MG tablet Take 1 tablet (3.125 mg total) by mouth 2 (two) times daily with a meal.  . clopidogrel (PLAVIX) 75 MG tablet Take 75 mg by mouth daily.   . Cyanocobalamin ([employer] B-12) 1500 MCG TBDP Take 1 tablet by mouth daily.   . DULoxetine (CYMBALTA) 60 MG capsule Take 60 mg by mouth 2 (two) times daily.  . furosemide (LASIX) 40 MG tablet Take 1 tablet (40 mg total) by mouth 2 (two) times daily.  [name] gabapentin (NEURONTIN) 600 MG tablet Take 600 mg by mouth at bedtime.   [name] glimepiride (AMARYL) 4 MG tablet Take 4 mg by mouth daily with breakfast.  . levothyroxine  (SYNTHROID, LEVOTHROID) 50 MCG tablet Take 50 mcg by mouth every morning.   [name] lisinopril (PRINIVIL,ZESTRIL) 5 MG tablet TAKE ONE TABLET BY MOUTH TWICE A DAY  . midodrine (PROAMATINE) 5 MG tablet Take 5 mg by mouth daily as needed (WHEN bp LEVELS DROP).   . nitroGLYCERIN (NITROSTAT) 0.4 MG SL tablet Place 1 tablet (0.4 mg total) under the tongue every 5 (five) minutes as needed for chest pain.  . potassium chloride SA (K-DUR,KLOR-CON) 20 MEQ tablet Take 1 tablet (20 mEq total) by mouth daily.  . pramipexole (MIRAPEX) 0.25 MG tablet Take 2 tablets (0.5 mg total) by mouth at bedtime.  . pravastatin (PRAVACHOL) 40 MG tablet Take 40 mg by mouth every morning.   . tamsulosin (FLOMAX) 0.4 MG CAPS capsule Take 1 capsule by mouth every morning.  . Thiamine Mononitrate (VITAMIN B1 PO) Take 1,000 mg by mouth 2 (two) times daily.   No facility-administered encounter medications on file as of 05/18/2017.   :  Review of Systems:  Out of a complete 14 point review of systems, all are reviewed and negative with the exception of these symptoms as listed below:  Review of Systems  Neurological:       Pt presents today to discuss his RLS. Pt says that his RLS has not been well lately, and pt thinks that his gabapentin is the cause. Pt has reduced his gabapentin to '600mg'$  QHS (instead of QID) and pt feels that his gabapentin makes his RLS worse. Pt is taking the mirapex 2 tablets qhs as prescribed but the pt reports that the mirapex only seems to help intermittently.    Objective:  Neurological Exam   Physical Exam Physical Examination:   Vitals:   05/18/17 0928  BP: 114/66  Pulse: 84   General Examination: The patient is a very pleasant 71 y.o. male in no acute distress. He appears well-developed and well-nourished and well groomed. Good spirits.   HEENT: Normocephalic, atraumatic, pupils are equal, round and reactive to light  and accommodation. S/p b/l cataract repairs, hearing is impaired, has hearing  aids. Wears corrective eyeglasses. Extraocular tracking is good without limitation to gaze excursion or nystagmus noted. Normal smooth pursuit is noted. Hearing is grossly intact. Face is symmetric with normal facial animation and normal facial sensation. Speech is clear with no dysarthria noted. There is no hypophonia. There is no lip, neck/head, jaw or voice tremor. Neck is supple with full range of passive and active motion. There are no carotid bruits on auscultation. Oropharynx exam reveals: mild mouth dryness, adequate dental hygiene with full dentureand moderate airway crowding. Mallampati is class II. Tongue protrudes centrally and palate elevates symmetrically.   Chest: Clear to auscultation without wheezing, rhonchi or crackles noted.  Heart: S1+S2+0, regular and normal without murmurs, rubs or gallops noted.   Abdomen: Soft, non-tender and non-distended with normal bowel sounds appreciated on auscultation.  Extremities: There is trace pitting edema in the distal lower extremities bilaterally.   Skin: Warm and dry without trophic changes noted.  Musculoskeletal: exam reveals no obvious joint deformities, tenderness or joint swelling or erythema.   Neurologically:  Mental status: The patient is awake, alert and oriented in all 4 spheres. His immediate and remote memory, attention, language skills and fund of knowledge are appropriate. There is no evidence of aphasia, agnosia, apraxia or anomia. Speech is clear with normal prosody and enunciation. Thought process is linear. Mood is normal and affect is normal.  Cranial nerves II - XII are as described above under HEENT exam. In addition: shoulder shrug is normal with equal shoulder height noted. Motor exam: Normal bulk, strength and tone is noted. There is no drift, tremor or rebound. Reflexes are 1+ in the upper extremities, absent in the lower extremities. In the upper extremities for age, decreased in the lower extremities.   Cerebellar testing: No dysmetria or intention tremor on finger to nose testing. Heel to shin is unremarkable bilaterally. There is no truncal or gait ataxia.  Sensory exam: intact to light touch in the distal lower extremities.  Gait, station and balance: He stands with difficulty and has to push himself up. He stands wide-based. Romberg is not tested for safety.  balance is impaired. He walks slowly and with difficulty, uses a single-point cane.   Assessment and Plan:  In summary, [name] is a very pleasant 71 year old male with an underlying Complex medical history of cardiomyopathy, carotid artery occlusion and status post left carotid endarterectomy, orthostatic hypotension, obesity, hypertension, reflux disease, low back pain, type 2 diabetes, history of stroke, hypothyroidism, peripheral vascular disease, status post back surgery and status post neck surgery, who presents for  follow-up consultation of his long-standing history of restless leg syndrome and PLMS. He also has a history of sleep apnea but has not been using his CPAP on a regular basis. He is motivated to restart treatment for this and has ordered a CPAP cleaner machine, waiting for this to arrive. He is on gabapentin which at one point was up to 600 mg 4 times a day through his primary care provider, he stopped it abruptly in the past and was previously counseled on not starting any psychotropic on neuro tropic medications abruptly like this. He does not feel that the gabapentin has been helpful. This is currently at 600 mg at night. He is advised to face out of it by reducing it to half a pill each night for a week then stop. He has been on Mirapex 0.25 mg strength at that  time and we increased it at our first visit in May 2018 to 0.5 mg each bedtime. I suggested we cautiously increase this to 1 pill at 7 PM and 2 pills at 9 PM at this time. He is agreeable to trying this. We will have to monitor his lower extremity swelling. I  suggested a three-month checkup with one of our nurse practitioners. I am also hoping that once he is consistent with his CPAP usage, he may feel that the restless leg symptoms improve as well. I answered all his questions today and he was in agreement with the plan. I spent 30 minutes in total face-to-face time with the patient, more than 50% of which was spent in counseling and coordination of care, reviewing test results, reviewing medication and discussing or reviewing the diagnosis of RLS, its prognosis and treatment options. Pertinent laboratory and imaging test results that were available during this visit with the patient were reviewed by me and considered in my medical decision making (see chart for details).

## 2017-05-18 NOTE — Patient Instructions (Addendum)
Please continue to limit your caffeine, it sounds like you have done okay so far!  I commend you for trying to go back on CPAP! You may find that your restless legs symptoms improve when you are compliant with CPAP.   Let's try the following: reduce the gabapentin to 300 mg each night, by cutting the 600 mg tablet in half for one week, then let's stop the gabapentin altogether.   We will increase your Pramipexole to 1 pill at 7 PM and 2 pills at 9 PM.  Follow up in about 3 months with one of our nurse practitioners for a recheck on your progress.

## 2017-06-18 ENCOUNTER — Other Ambulatory Visit: Payer: Self-pay | Admitting: Cardiovascular Disease

## 2017-06-20 ENCOUNTER — Telehealth: Payer: Self-pay | Admitting: Cardiovascular Disease

## 2017-06-20 NOTE — Telephone Encounter (Signed)
New Message   *STAT* If patient is at the pharmacy, call can be transferred to refill team.   1. Which medications need to be refilled? (please list name of each medication and dose if known) Carvedilol 3.125mg    2. Which pharmacy/location (including street and city if local pharmacy) is medication to be sent to? [place] [facility]  3. Do they need a 30 day or 90 day supply? [place]

## 2017-07-25 ENCOUNTER — Ambulatory Visit (HOSPITAL_COMMUNITY)
Admission: RE | Admit: 2017-07-25 | Discharge: 2017-07-25 | Disposition: A | Discharge status: Home / Self Care | Payer: PPO | Source: Ambulatory Visit | Attending: Family | Admitting: Family

## 2017-07-25 ENCOUNTER — Encounter: Payer: Self-pay | Admitting: Family

## 2017-07-25 ENCOUNTER — Ambulatory Visit (INDEPENDENT_AMBULATORY_CARE_PROVIDER_SITE_OTHER): Payer: PPO | Admitting: Family

## 2017-07-25 VITALS — BP 128/81 | HR 80 | Temp 97.0°F | Resp 18 | Ht 72.0 in | Wt 234.0 lb

## 2017-07-25 DIAGNOSIS — I6523 Occlusion and stenosis of bilateral carotid arteries: Secondary | ICD-10-CM | POA: Insufficient documentation

## 2017-07-25 DIAGNOSIS — Z9889 Other specified postprocedural states: Secondary | ICD-10-CM

## 2017-07-25 LAB — VAS US CAROTID
LEFT ECA DIAS: -28 cm/s
LEFT VERTEBRAL DIAS: 9 cm/s
Left CCA dist dias: 29 cm/s
Left CCA dist sys: 93 cm/s
Left CCA prox dias: 33 cm/s
Left CCA prox sys: 128 cm/s
Left ICA dist dias: -24 cm/s
Left ICA dist sys: -61 cm/s
Left ICA prox dias: -33 cm/s
Left ICA prox sys: -90 cm/s
RIGHT CCA MID DIAS: 28 cm/s
RIGHT ECA DIAS: -18 cm/s
RIGHT VERTEBRAL DIAS: -9 cm/s
Right CCA prox dias: 16 cm/s
Right CCA prox sys: 77 cm/s
Right cca dist sys: -50 cm/s

## 2017-07-25 NOTE — Patient Instructions (Signed)
Stroke Prevention Some health problems and behaviors may make it more likely for you to have a stroke. Below are ways to lessen your risk of having a stroke.  Be active for at least 30 minutes on most or all days.  Do not smoke. Try not to be around others who smoke.  Do not drink too much alcohol. ? Do not have more than 2 drinks a day if you are a man. ? Do not have more than 1 drink a day if you are a woman and are not pregnant.  Eat healthy foods, such as fruits and vegetables. If you were put on a specific diet, follow the diet as told.  Keep your cholesterol levels under control through diet and medicines. Look for foods that are low in saturated fat, trans fat, cholesterol, and are high in fiber.  If you have diabetes, follow all diet plans and take your medicine as told.  Ask your doctor if you need treatment to lower your blood pressure. If you have high blood pressure (hypertension), follow all diet plans and take your medicine as told by your doctor.  If you are 55-89 years old, have your blood pressure checked every 3-5 years. If you are age 30 or older, have your blood pressure checked every year.  Keep a healthy weight. Eat foods that are low in calories, salt, saturated fat, trans fat, and cholesterol.  Do not take drugs.  Avoid birth control pills, if this applies. Talk to your doctor about the risks of taking birth control pills.  Talk to your doctor if you have sleep problems (sleep apnea).  Take all medicine as told by your doctor. ? You may be told to take aspirin or blood thinner medicine. Take this medicine as told by your doctor. ? Understand your medicine instructions.  Make sure any other conditions you have are being taken care of.  Get help right away if:  You suddenly lose feeling (you feel numb) or have weakness in your face, arm, or leg.  Your face or eyelid hangs down to one side.  You suddenly feel confused.  You have trouble talking  (aphasia) or understanding what people are saying.  You suddenly have trouble seeing in one or both eyes.  You suddenly have trouble walking.  You are dizzy.  You lose your balance or your movements are clumsy (uncoordinated).  You suddenly have a very bad headache and you do not know the cause.  You have new chest pain.  Your heart feels like it is fluttering or skipping a beat (irregular heartbeat). Do not wait to see if the symptoms above go away. Get help right away. Call your local emergency services (911 in [place] Do not drive yourself to the hospital. This information is not intended to replace advice given to you by your health care provider. Make sure you discuss any questions you have with your health care provider. Document Released: 03/07/2012 Document Revised: 02/12/2016 Document Reviewed: 03/09/2013 Elsevier Interactive Patient Education  2018 [employer].     Preventing Cerebrovascular Disease Arteries are blood vessels that carry blood that contains oxygen from the heart to all parts of the body. Cerebrovascular disease affects arteries that supply the brain. Any condition that blocks or disrupts blood flow to the brain can cause cerebrovascular disease. Brain cells that lose blood supply start to die within minutes (stroke). Stroke is the main danger of cerebrovascular disease. Atherosclerosis and high blood pressure are common causes of cerebrovascular disease. Atherosclerosis  is narrowing and hardening of an artery that results when fat, cholesterol, calcium, or other substances (plaque) build up inside an artery. Plaque reduces blood flow through the artery. High blood pressure increases the risk of bleeding inside the brain. Making diet and lifestyle changes to prevent atherosclerosis and high blood pressure lowers your risk of cerebrovascular disease. What nutrition changes can be made?  Eat more fruits, vegetables, and whole grains.  Reduce how much saturated  fat you eat. To do this, eat less red meat and fewer full-fat dairy products.  Eat healthy proteins instead of red meat. Healthy proteins include: ? Fish. Eat fish that contains heart-healthy omega-3 fatty acids, twice a week. Examples include salmon, albacore tuna, mackerel, and herring. ? Chicken. ? Nuts. ? Low-fat or nonfat yogurt.  Avoid processed meats, like bacon and lunchmeat.  Avoid foods that contain: ? A lot of sugar, such as sweets and drinks with added sugar. ? A lot of salt (sodium). Avoid adding extra salt to your food, as told by your health care provider. ? Trans fats, such as margarine and baked goods. Trans fats may be listed as "partially hydrogenated oils" on food labels.  Check food labels to see how much sodium, sugar, and trans fats are in foods.  Use vegetable oils that contain low amounts of saturated fat, such as olive oil or canola oil. What lifestyle changes can be made?  Drink alcohol in moderation. This means no more than 1 drink a day for nonpregnant women and 2 drinks a day for men. One drink equals 12 oz of beer, 5 oz of wine, or 1 oz of hard liquor.  If you are overweight, ask your health care provider to recommend a weight-loss plan for you. Losing 5-10 lb (2.2-4.5 kg) can reduce your risk of diabetes, atherosclerosis, and high blood pressure.  Exercise for 30?60 minutes on most days, or as much as told by your health care provider. ? Do moderate-intensity exercise, such as brisk walking, bicycling, and water aerobics. Ask your health care provider which activities are safe for you.  Do not use any products that contain nicotine or tobacco, such as cigarettes and e-cigarettes. If you need help quitting, ask your health care provider. Why are these changes important? Making these changes lowers your risk of many diseases that can cause cerebrovascular disease and stroke. Stroke is a leading cause of death and disability. Making these changes also  improves your overall health and quality of life. What can I do to lower my risk? The following factors make you more likely to develop cerebrovascular disease:  Being overweight.  Smoking.  Being physically inactive.  Eating a high-fat diet.  Having certain health conditions, such as: ? Diabetes. ? High blood pressure. ? Heart disease. ? Atherosclerosis. ? High cholesterol. ? Sickle cell disease.  Talk with your health care provider about your risk for cerebrovascular disease. Work with your health care provider to control diseases that you have that may contribute to cerebrovascular disease. Your health care provider may prescribe medicines to help prevent major causes of cerebrovascular disease. Where to find more information: Learn more about preventing cerebrovascular disease from:  [place], Lung, and [place]: MoAnalyst.de  Centers for Disease Control and Prevention: http://www.curry-wood.biz/  Summary  Cerebrovascular disease can lead to a stroke.  Atherosclerosis and high blood pressure are major causes of cerebrovascular disease.  Making diet and lifestyle changes can reduce your risk of cerebrovascular disease.  Work with your health care provider to  get your risk factors under control to reduce your risk of cerebrovascular disease. This information is not intended to replace advice given to you by your health care provider. Make sure you discuss any questions you have with your health care provider. Document Released: 09/21/2015 Document Revised: 03/26/2016 Document Reviewed: 09/21/2015 Elsevier Interactive Patient Education  2018 [employer].

## 2017-07-25 NOTE — Progress Notes (Signed)
Chief Complaint: Follow up Extracranial Carotid Artery Stenosis   History of Present Illness  [name] is a 71 y.o. male is here for f/u after left CEA on 11/29/13 by Dr. [name]. He states that he has undergone angiogram by Dr. Gwenlyn Found with "roto rooter" of his right leg. He states that he does have problems with neuropathy and restless leg syndrome. He states that he goes to the podiatrist for foot care. He reports a stroke in 2013 or 2014 as manifested by expressive aphasia, no strokes or TIA's subsequently. He denies any history of amaurosis fugax, paresthesias, or hemiparesis.  He had a cardiac cath in August 2017: Diffuse moderate to severe LAD disease. The most severe region is in the mid to distal vessel and is somewhat eccentric with up to 80% obstruction.Widely patent RCA and circumflex. This is being medically treated.  Pt states he has had left leg lag that is not worsening, unclear etiology.  Pt Diabetic: Yes, states his last A1C was about 7.2 Pt smoker: former smoker  Pt meds include: Statin : Yes ASA: Yes Other anticoagulants/antiplatelets: Plavix   Past Medical History:  Diagnosis Date  . Arthritis   . Cardiomyopathy- EF NL 01/2013, now 30-35% echo 03/14/2012  . Carotid artery occlusion    left s/p CEA  . Diabetes mellitus   . Hypertension   . Hypothyroidism   . Obstructive sleep apnea   . Orthostatic hypotension   . Peripheral arterial disease ([place]), s/p PTA x 2 RLE    nonhealing ulcers bilaterally on each great toe  . PONV (postoperative nausea and vomiting)   . Restless leg syndrome   . Shortness of breath   . Stroke [facility]) March 08, 2012    Social History Social History   Tobacco Use  . Smoking status: Former Smoker    Years: 1.00    Types: Pipe    Last attempt to quit: 07/08/1977    Years since quitting: 40.0  . Smokeless tobacco: Never Used  Substance Use Topics  . Alcohol use: No    Alcohol/week: 0.0 oz  . Drug use: No     Family History Family History  Problem Relation Age of Onset  . Heart disease Mother   . Hypertension Mother   . Heart attack Mother   . Hypertension Father   . Diabetes Father   . Diabetes Son   . Heart disease Son   . Hypertension Son     Surgical History Past Surgical History:  Procedure Laterality Date  . ANGIOPLASTY  02/28/14   diamond back orbital rotational atherectomy of Rt. tibial  . BACK SURGERY    . CERVICAL FUSION    . EYE SURGERY    . FOOT SURGERY    . Lower ext duplex doppler  03/14/14   Rt ABI 1.2  . PV angiogram  02/18/2014   tibial vessel diseas bil.  [name] SPINE SURGERY    . tendon achillies lengthing and sesamoid      Allergies  Allergen Reactions  . Codeine Nausea And Vomiting  . Tramadol Nausea Only    Current Outpatient Medications  Medication Sig Dispense Refill  . aspirin EC 81 MG EC tablet Take 1 tablet (81 mg total) by mouth daily. (Patient taking differently: Take 81 mg by mouth at bedtime. )    . b complex vitamins tablet Take 1 tablet by mouth daily.    . carvedilol (COREG) 3.125 MG tablet TAKE ONE TABLET BY MOUTH TWICE A DAY  WITH A MEAL 180 tablet 3  . clopidogrel (PLAVIX) 75 MG tablet Take 75 mg by mouth daily.     . Cyanocobalamin ([employer] B-12) 1500 MCG TBDP Take 1 tablet by mouth daily.     . DULoxetine (CYMBALTA) 60 MG capsule Take 60 mg by mouth 2 (two) times daily.    . furosemide (LASIX) 40 MG tablet Take 1 tablet (40 mg total) by mouth 2 (two) times daily. 180 tablet 3  . gabapentin (NEURONTIN) 600 MG tablet Take 600 mg by mouth at bedtime.     [name] glimepiride (AMARYL) 4 MG tablet Take 4 mg by mouth daily with breakfast.    . levothyroxine (SYNTHROID, LEVOTHROID) 50 MCG tablet Take 50 mcg by mouth every morning.     [name] lisinopril (PRINIVIL,ZESTRIL) 5 MG tablet TAKE ONE TABLET BY MOUTH TWICE A DAY 180 tablet 3  . midodrine (PROAMATINE) 5 MG tablet Take 5 mg by mouth daily as needed (WHEN bp LEVELS DROP).     . nitroGLYCERIN (NITROSTAT)  0.4 MG SL tablet Place 1 tablet (0.4 mg total) under the tongue every 5 (five) minutes as needed for chest pain. 25 tablet 3  . potassium chloride SA (K-DUR,KLOR-CON) 20 MEQ tablet Take 1 tablet (20 mEq total) by mouth daily. 90 tablet 3  . pramipexole (MIRAPEX) 0.25 MG tablet Take 1 pill at 7 PM and 2 pills at 9 PM daily. 270 tablet 3  . pravastatin (PRAVACHOL) 40 MG tablet Take 40 mg by mouth every morning.     . tamsulosin (FLOMAX) 0.4 MG CAPS capsule Take 1 capsule by mouth every morning.    . Thiamine Mononitrate (VITAMIN B1 PO) Take 1,000 mg by mouth 2 (two) times daily.     No current facility-administered medications for this visit.     Review of Systems : See HPI for pertinent positives and negatives.  Physical Examination  Vitals:   07/25/17 1457 07/25/17 1501  BP: 116/74 128/81  Pulse: 80   Resp: 18   Temp: (!) 97 F (36.1 C)   TempSrc: Oral   SpO2: 94%   Weight: 234 lb (106.1 kg)   Height: 6' (1.829 m)    Body mass index is 31.74 kg/m.  General: WDWN obese male in NAD GAIT:left leg lag, using cane Eyes: PERRLA Pulmonary: Respirations are non-labored at rest, slightly dyspneic with talking, limited air movement in all fields. Moist cough.   Cardiac: regular rhythm, no detected murmur.  VASCULAR EXAM Carotid Bruits Left Right   Negative Negative   Radial pulses are 2+ palpable and equal. Abdominal aortic pulse is not palpable.       LE Pulses LEFT RIGHT   POPLITEAL not palpable  not palpable  PT Not palpable 1+ palpable  DP Not palpable Not palpable    Gastrointestinal: soft, nontender, BS WNL, no r/g,no palpable masses.  Musculoskeletal: No muscle atrophy/wasting. M/S 5/5 throughout, Extremities without ischemic changes.  Neurologic: A&O X 3; appropriate affect; SENSATION is normal;  speech is normal, CN 2-12 intact, has some hearing loss, Pain and light touch intact in extremities, Motor exam as listed above.    Assessment: [name] is a 71 y.o. male who is s/pleft CEA on 11/29/13. He had a stroke in 2013 or 2014 as manifested by expressive aphasia, no subsequent strokes or TIA's.  DATA Carotid Duplex (07/25/17): Right ICA: 1-39% stenosis. Left ICA: carotid endarterectomy site with no evidence for restenosis. Bilateral vertebral arteries are antegrade. Bilateral subclavian artery waveforms are triphasic.  No significant change since prior exams on 07/14/15 and 07/19/16.  Lower limb arterial disease s/p atherectomy right leg per Dr. Gwenlyn Found, who is monitoring pt's arterial perfusion; ABI's in July 2018 were normal.    Plan: Follow-up in 1 year with Carotid Duplex scan.    I discussed in depth with the patient the nature of atherosclerosis, and emphasized the importance of maximal medical management including strict control of blood pressure, blood glucose, and lipid levels, obtaining regular exercise, and continued cessation of smoking.  The patient is aware that without maximal medical management the underlying atherosclerotic disease process will progress, limiting the benefit of any interventions. The patient was given information about stroke prevention and what symptoms should prompt the patient to seek immediate medical care. Thank you for allowing [place] to participate in this patient's care.  Clemon Chambers, RN, MSN, FNP-C [facility] Office: [facility] Physician: [name]  07/25/17 3:10 PM

## 2017-08-03 NOTE — Addendum Note (Signed)
Addended by: Lianne Cure A on: 08/03/2017 10:43 AM   Modules accepted: Orders

## 2017-08-15 DIAGNOSIS — Z23 Encounter for immunization: Secondary | ICD-10-CM | POA: Diagnosis not present

## 2017-08-18 ENCOUNTER — Ambulatory Visit: Payer: PPO | Admitting: Neurology

## 2017-08-18 ENCOUNTER — Encounter: Payer: Self-pay | Admitting: Neurology

## 2017-08-18 VITALS — BP 124/71 | HR 96 | Ht 72.0 in | Wt 233.0 lb

## 2017-08-18 DIAGNOSIS — G2581 Restless legs syndrome: Secondary | ICD-10-CM

## 2017-08-18 DIAGNOSIS — M25471 Effusion, right ankle: Secondary | ICD-10-CM | POA: Diagnosis not present

## 2017-08-18 DIAGNOSIS — G4761 Periodic limb movement disorder: Secondary | ICD-10-CM

## 2017-08-18 DIAGNOSIS — M25472 Effusion, left ankle: Secondary | ICD-10-CM | POA: Diagnosis not present

## 2017-08-18 DIAGNOSIS — R42 Dizziness and giddiness: Secondary | ICD-10-CM

## 2017-08-18 MED ORDER — PRAMIPEXOLE DIHYDROCHLORIDE 0.25 MG PO TABS: ORAL_TABLET | ORAL | 3 refills | Status: DC

## 2017-08-18 NOTE — Patient Instructions (Addendum)
Lets try to increase your mirapex 0.25 mg just a little more, take 2 pills at 7 PM and 2 at 9 PM.   We will do a 6 month recheck.   Try to hydrate well with water.  Please change positions slowly and start using compression socks up to the knees, which may help your lightheadedness.

## 2017-08-18 NOTE — Progress Notes (Signed)
Subjective:    Patient ID: [name] is a 71 y.o. male.  HPI     Interim history:   [name] is a 71 year old left-handed gentleman with an underlying medical history of cardiomyopathy, carotid artery occlusion and status post left carotid endarterectomy, orthostatic hypotension, obesity, hypertension, reflux disease, low back pain, type 2 diabetes, history of stroke, hypothyroidism, peripheral vascular disease, status post back surgery and status post neck surgery, who presents for follow-up consultation of his long-standing history of restless leg syndrome. The patient is unaccompanied today. I last saw him on 05/18/2017, at which time [name] felt the Mirapex was helping only temporarily. [name] was also on gabapentin 600 mg at night. [name] is trying to take the Mirapex about 2 hours before his bedtime. [name] was tolerating Mirapex at 0.5 mg each night. His lower extremity swelling was about the same. [name] was planning on being compliant with his CPAP machine. I suggested [name] taper off the gabapentin. In lieu of that I increased his pramipexole to 0.25 mg at 7 PM and 0.5 mg at 9 PM.   Today, 08/18/2017 (all dictated new, as well as above notes, some dictation done in note pad or Word, outside of chart, may appear as copied): [name] reports feeling that at times his whole body seems to twitch. But overall, feels the tapering the gabapentin was helpful and adding the mirapex in the evening helped too. [name] has cut back on his soda by 2/3. [name] tries to use his CPAP. [name] has significant sleep disruption due to taking lasix in the evening. [name] has lightheadedness upon standing. [name] may not always drink enough water.  The patient's allergies, current medications, family history, past medical history, past social history, past surgical history and problem list were reviewed and updated as appropriate.   Previously (copied from previous notes for reference):   I first met him on 02/15/2017 at the request of his primary  care physician, at which time [name] reported a likely 30 year plus history of restless leg syndrome. [name] had been on high-dose gabapentin but stopped it on his own. [name] was on low-dose Mirapex and I suggested we increase this. [name] was advised to go back on his CPAP for sleep apnea. [name] was also advised to reduce his caffeine intake.     02/15/2017: ([name]) reports restless legs symptoms for the past many years, likely over 30 years. I reviewed your office note from 01/26/2017, which you kindly included. [name] has been on Mirapex 0.25 mg strength for the past months, maybe since 12/17. [name] is also Cymbalta 60 mg twice daily. [name] presented to the emergency room recently on 01/25/2017 for a near syncopal event. I reviewed the emergency room records. Symptoms were suspected to be secondary to volume depletion and [name] improved subjectively with fluids. [name] was recently tried on gabapentin, but felt worse and stopped it. His diabetic PN symptoms are worse since [name] stopped it, [name] was on 600 mg 4 times a day and stopped it all at once - I advised him never to stop this med "cold [place]".  [name] is a non-smoker, does not currently drinking alcohol currently, but drinks a significant amount of caffeine in the form of soda/cola, ("lots") 3 x 44 oz per day, by self report. [name] reports feeling restless in his body.  [name] has not used his CPAP in over 2 months, waiting for a cleaning machine for the CPAP. [name] had sleep study testing last year, [name] believes, but then recalls  it was in 2014. I reviewed His sleep study report from 08/03/2013. [name] had a baseline sleep study at the [facility]. Sleep efficiency was only 60%, REM latency was 99 minutes, overall AHI was 26 per hour, average oxygen saturation was 90%, nadir was 77% during REM sleep. [name] had severe PLMS with an index of 127.7 per hour, PLM arousal index was elevated at 52.9 per hour. [name] recalls that [name] went back for a second sleep study for CPAP titration. Of note, [name]  has noticed worsening of RLS symptoms since coming off of the CPAP. [name] has severe nocturia, about 5 times per night, takes his diuretic at night, although [name] is supposed to take it in the morning. [name] has never seen a urologist. 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 His bedtime is around 10 PM, [name] tries to take his restless legs medication an hour before. Wake up time is around 9 AM.   His Past Medical History Is Significant For: Past Medical History:  Diagnosis Date  . Arthritis   . Cardiomyopathy- EF NL 01/2013, now 30-35% echo 03/14/2012  . Carotid artery occlusion    left s/p CEA  . Diabetes mellitus   . Hypertension   . Hypothyroidism   . Obstructive sleep apnea   . Orthostatic hypotension   . Peripheral arterial disease ([place]), s/p PTA x 2 RLE    nonhealing ulcers bilaterally on each great toe  . PONV (postoperative nausea and vomiting)   . Restless leg syndrome   . Shortness of breath   . Stroke [facility]) March 08, 2012    His Past Surgical History Is Significant For: Past Surgical History:  Procedure Laterality Date  . ANGIOPLASTY  02/28/14   diamond back orbital rotational atherectomy of Rt. tibial  . BACK SURGERY    . CARDIAC CATHETERIZATION N/A 05/19/2016   Procedure: Right/Left Heart Cath and Coronary Angiography;  Surgeon: Belva Crome, MD;  Location: [facility];  Service: Cardiovascular;  Laterality: N/A;  . CERVICAL FUSION    . ENDARTERECTOMY Left 11/29/2013   Procedure: ENDARTERECTOMY CAROTID;  Surgeon: Serafina Mitchell, MD;  Location: [facility];  Service: Vascular;  Laterality: Left;  . EYE SURGERY    . FOOT SURGERY    . Lower ext duplex doppler  03/14/14   Rt ABI 1.2  . LOWER EXTREMITY ANGIOGRAM Bilateral 02/18/2014   Procedure: LOWER EXTREMITY ANGIOGRAM;  Surgeon: [name], MD;  Location: [facility] CATH LAB;  Service:  Cardiovascular;  Laterality: Bilateral;  . LOWER EXTREMITY ANGIOGRAM N/A 10/31/2014   Procedure: LOWER EXTREMITY ANGIOGRAM;  Surgeon: [name], MD;  Location[facility] C[facility];  Service: Cardiovascular;  Laterality: N/A;  . PV angiogram  02/18/2014   tibial vessel diseas bil.  [name] SPINE SURGERY    . tendon achillies lengthing and sesamoid      His Family History Is Significant For: Family History  Problem Relation Age of Onset  . Heart disease Mother   . Hypertension Mother   . Heart attack Mother   . Hypertension Father   . Diabetes Father   . Diabetes Son   . Heart disease Son   . Hypertension Son     His Social History Is Significant For: Social History   Socioeconomic History  . Marital status: Married  Spouse name: None  . Number of children: None  . Years of education: college  . Highest education level: None  Social Needs  . Financial resource strain: None  . Food insecurity - worry: None  . Food insecurity - inability: None  . Transportation needs - medical: None  . Transportation needs - non-medical: None  Occupational History  . Occupation: Firefighter   Tobacco Use  . Smoking status: Former Smoker    Years: 1.00    Types: Pipe    Last attempt to quit: 07/08/1977    Years since quitting: 40.1  . Smokeless tobacco: Never Used  Substance and Sexual Activity  . Alcohol use: No    Alcohol/week: 0.0 oz  . Drug use: No  . Sexual activity: None  Other Topics Concern  . None  Social History Narrative   Drinks 3 44oz cokes a day     His Allergies Are:  Allergies  Allergen Reactions  . Codeine Nausea And Vomiting  . Tramadol Nausea Only  :   His Current Medications Are:  Outpatient Encounter Medications as of 08/18/2017  Medication Sig  . aspirin EC 81 MG EC tablet Take 1 tablet (81 mg total) by mouth daily. (Patient taking differently: Take 81 mg by mouth at bedtime. )  . b complex vitamins tablet Take 1 tablet by mouth daily.  . carvedilol  (COREG) 3.125 MG tablet TAKE ONE TABLET BY MOUTH TWICE A DAY WITH A MEAL  . clopidogrel (PLAVIX) 75 MG tablet Take 75 mg by mouth daily.   . Cyanocobalamin ([employer] B-12) 1500 MCG TBDP Take 1 tablet by mouth daily.   . DULoxetine (CYMBALTA) 60 MG capsule Take 60 mg by mouth 2 (two) times daily.  . furosemide (LASIX) 40 MG tablet Take 1 tablet (40 mg total) by mouth 2 (two) times daily.  [name] gabapentin (NEURONTIN) 600 MG tablet Take 600 mg by mouth at bedtime.   [name] glimepiride (AMARYL) 4 MG tablet Take 4 mg by mouth daily with breakfast.  . levothyroxine (SYNTHROID, LEVOTHROID) 50 MCG tablet Take 50 mcg by mouth every morning.   [name] lisinopril (PRINIVIL,ZESTRIL) 5 MG tablet TAKE ONE TABLET BY MOUTH TWICE A DAY  . midodrine (PROAMATINE) 5 MG tablet Take 5 mg by mouth daily as needed (WHEN bp LEVELS DROP).   . nitroGLYCERIN (NITROSTAT) 0.4 MG SL tablet Place 1 tablet (0.4 mg total) under the tongue every 5 (five) minutes as needed for chest pain.  . potassium chloride SA (K-DUR,KLOR-CON) 20 MEQ tablet Take 1 tablet (20 mEq total) by mouth daily.  . pramipexole (MIRAPEX) 0.25 MG tablet Take 1 pill at 7 PM and 2 pills at 9 PM daily.  . pravastatin (PRAVACHOL) 40 MG tablet Take 40 mg by mouth every morning.   . tamsulosin (FLOMAX) 0.4 MG CAPS capsule Take 1 capsule by mouth every morning.  . Thiamine Mononitrate (VITAMIN B1 PO) Take 1,000 mg by mouth 2 (two) times daily.   No facility-administered encounter medications on file as of 08/18/2017.   :  Review of Systems:  Out of a complete 14 point review of systems, all are reviewed and negative with the exception of these symptoms as listed below: Review of Systems  Neurological:       Pt presents today to discuss his RLS. Pt reports that now his whole body twitches.    Objective:  Neurological Exam  Physical Exam Physical Examination:   Vitals:   08/18/17 1038  BP: 124/71  Pulse: 96  General Examination: The patient is a very pleasant 71 y.o.  male in no acute distress. [name] appears well-developed and well-nourished and well groomed.   HEENT:Normocephalic, atraumatic, pupils are equal, round and reactive to light and accommodation. S/p b/l cataract repairs, hearing is impaired.Wears corrective eyeglasses. Extraocular tracking is good without limitation to gaze excursion or nystagmus noted. Normal smooth pursuit is noted. Hearing is grossly intact. Face is symmetric with normal facial animation and normal facial sensation. Speech is clear with no dysarthria noted. There is no hypophonia. There is no lip, neck/head, jaw or voice tremor. Neck is supple with full range of passive and active motion. There are no carotid bruits on auscultation. Oropharynx exam reveals: mildmouth dryness, adequatedental hygiene with full dentureand moderateairway crowding. Mallampati is class II. Tongue protrudes centrally and palate elevates symmetrically.   Chest:Clear to auscultation without wheezing, rhonchi or crackles noted.  Heart:S1+S2+0, regular and normal without murmurs, rubs or gallops noted.   Abdomen:Soft, non-tender and non-distended with normal bowel sounds appreciated on auscultation.  Extremities:There is 1+ pitting edema in the distal lower extremities bilaterally.   Skin: Warm and dry without trophic changes noted.  Musculoskeletal: exam reveals no obvious joint deformities, tenderness or joint swelling or erythema.   Neurologically:  Mental status: The patient is awake, alert and oriented in all 4 spheres. Hisimmediate and remote memory, attention, language skills and fund of knowledge are appropriate. There is no evidence of aphasia, agnosia, apraxia or anomia. Speech is clear with normal prosody and enunciation. Thought process is linear. Mood is normaland affect is normal.  Cranial nerves II - XII are as described above under HEENT exam. In addition: shoulder shrug is normal with equal shoulder height noted. Motor exam:  Normal bulk, strength and tone is noted. There is no drift, tremor or rebound. Reflexes are 1+ in the upper extremities, absent in the lower extremities. In the upper extremities for age, decreased in the lower extremities.  Cerebellar testing: No dysmetria or intention tremor. There is no truncal or gait ataxia.  Sensory exam: intact to light touch in the distal lower extremities.  Gait, station and balance: Hestands with difficultyand has to push himself up. [name] stands wide-based. Romberg is not tested for safety. Balance is impaired. [name] walks slowly and with difficulty, uses a single-point cane.   Assessment and Plan:  In summary, OSEI ANGER a very pleasant 71 year old male with an underlying complex medical history of cardiomyopathy, carotid artery occlusion and status post left carotid endarterectomy, orthostatic hypotension, obesity, hypertension, reflux disease, low back pain, type 2 diabetes, history of stroke, hypothyroidism, peripheral vascular disease, status post back surgery and status post neck surgery, who presents for  follow-up consultation of his long-standing history of restless leg syndrome and PLMs. [name] also has a history of sleep apnea and is encouraged to use his CPAP on a regular basis. [name] feels that coming off the gabapentin was helpful and also increasing the Mirapex. I suggested we try to further increase the mirapex cautiously to 0.25 mg strength 2 pills at 7 PM and 2 pills at 9 PM at this time. [name] is agreeable to trying this. We will have to monitor his lower extremity swelling. [name] was advised to hydrate better with water and start using compression socks to the lower extremities up to the knees. [name] can try to change the time [name] takes his Lasix. [name] may be able to split up the dose as well. [name] has reported lightheadedness when [name] changes positions  too quickly. [name] is advised to be cautious with his position changes and take this time. I suggested a 6 month checkup. I  answered all his questions today and [name] was in agreement.   I spent 25 minutes in total face-to-face time with the patient, more than 50% of which was spent in counseling and coordination of care, reviewing test results, reviewing medication and discussing or reviewing the diagnosis of PLMD, restless leg syndrome, lightheadedness, lower extremity swelling, the prognosis and treatment options. Pertinent laboratory and imaging test results that were available during this visit with the patient were reviewed by me and considered in my medical decision making (see chart for details).

## 2017-11-07 DIAGNOSIS — I428 Other cardiomyopathies: Secondary | ICD-10-CM | POA: Diagnosis not present

## 2017-11-07 DIAGNOSIS — I10 Essential (primary) hypertension: Secondary | ICD-10-CM | POA: Diagnosis not present

## 2017-11-07 DIAGNOSIS — I739 Peripheral vascular disease, unspecified: Secondary | ICD-10-CM | POA: Diagnosis not present

## 2017-11-07 DIAGNOSIS — E119 Type 2 diabetes mellitus without complications: Secondary | ICD-10-CM | POA: Diagnosis not present

## 2018-01-16 DIAGNOSIS — I739 Peripheral vascular disease, unspecified: Secondary | ICD-10-CM | POA: Diagnosis not present

## 2018-01-16 DIAGNOSIS — I2510 Atherosclerotic heart disease of native coronary artery without angina pectoris: Secondary | ICD-10-CM | POA: Diagnosis not present

## 2018-01-16 DIAGNOSIS — E1165 Type 2 diabetes mellitus with hyperglycemia: Secondary | ICD-10-CM | POA: Diagnosis not present

## 2018-01-16 DIAGNOSIS — E11621 Type 2 diabetes mellitus with foot ulcer: Secondary | ICD-10-CM | POA: Diagnosis not present

## 2018-02-03 ENCOUNTER — Ambulatory Visit (INDEPENDENT_AMBULATORY_CARE_PROVIDER_SITE_OTHER): Payer: PPO

## 2018-02-03 ENCOUNTER — Other Ambulatory Visit: Payer: Self-pay | Admitting: Podiatry

## 2018-02-03 ENCOUNTER — Encounter

## 2018-02-03 ENCOUNTER — Ambulatory Visit: Payer: PPO | Admitting: Podiatry

## 2018-02-03 ENCOUNTER — Encounter: Payer: Self-pay | Admitting: Podiatry

## 2018-02-03 VITALS — BP 106/59 | HR 74 | Resp 16

## 2018-02-03 DIAGNOSIS — M79672 Pain in left foot: Secondary | ICD-10-CM | POA: Diagnosis not present

## 2018-02-03 DIAGNOSIS — B351 Tinea unguium: Secondary | ICD-10-CM | POA: Diagnosis not present

## 2018-02-03 DIAGNOSIS — L97509 Non-pressure chronic ulcer of other part of unspecified foot with unspecified severity: Secondary | ICD-10-CM

## 2018-02-03 DIAGNOSIS — M79671 Pain in right foot: Secondary | ICD-10-CM

## 2018-02-03 DIAGNOSIS — M79675 Pain in left toe(s): Secondary | ICD-10-CM

## 2018-02-03 DIAGNOSIS — E13621 Other specified diabetes mellitus with foot ulcer: Secondary | ICD-10-CM

## 2018-02-03 DIAGNOSIS — I739 Peripheral vascular disease, unspecified: Secondary | ICD-10-CM | POA: Diagnosis not present

## 2018-02-03 DIAGNOSIS — L97521 Non-pressure chronic ulcer of other part of left foot limited to breakdown of skin: Secondary | ICD-10-CM

## 2018-02-03 DIAGNOSIS — M79674 Pain in right toe(s): Secondary | ICD-10-CM

## 2018-02-03 NOTE — Progress Notes (Signed)
   Subjective:    Patient ID: [name], male    DOB: 16-Apr-1946, 72 y.o.   MRN: [id number]  HPI    Review of Systems  All other systems reviewed and are negative.      Objective:   Physical Exam        Assessment & Plan:

## 2018-02-03 NOTE — Progress Notes (Signed)
Subjective:   Patient ID: [name], male   DOB: 72 y.o.   MRN: [id number]   HPI 72 year old male presents with ulceration of the left big toe and of the plantar right first metatarsal.  He states they have been present for a long time and he did traumatized skin on his left big toe by peeling off tissue.  Patient is a stable diabetic with last A1c being 5.5 and has history of circulatory disease and neuropathy.  Patient has been seen back by our office in the past and also complains of thickened nails that he cannot cut that become painful and that he said diabetic shoes in the past and needs to get a new pair.  Patient was noted not to smoke currently and likes to be active   Review of Systems  All other systems reviewed and are negative.       Objective:  Physical Exam  Constitutional: He appears well-developed and well-nourished.  Pulmonary/Chest: Effort normal.  Musculoskeletal: Normal range of motion.  Neurological: He is alert.  Skin: Skin is warm.  Nursing note and vitals reviewed.   Vascular status found to be diminished with diminished DP PT pulses noted with diminished hair growth noted also.  Patient is noted to have on the plantar aspect of the right foot a breakdown of tissue that is localized measuring approximately 3 x 5 cm with no proximal edema erythema or drainage noted.  There is subcutaneous exposure but no bone or tendon exposure and plantar aspect left hallux is also noted to have a breakdown of tissue measuring 1/2 cm x 1 cm with superficial depth noted.  Patient has thickened nailbeds 1-5 both feet and was noted to have incurvation and discomfort associated with them and has diminished sharp dull and vibratory     Assessment:  Ulceration of a chronic nature plantar aspect right first metatarsal left hallux with long-term history of neuropathy and circulatory disease with long-term diabetes and history of fusion of the right big toe joint and noted to have  mycotic chronic nail disease with pain 1-5 both the     Plan:  H&P x-rays reviewed with patient.  Today debridement of nailbeds 1-5 both feet was accomplished and debridement of lesion right with sharp dissection and lesion left with sharp dissection flushing the areas and applying meta honey to this with dressing and instructions for leaving the directions on for 1 week.  Strict instructions given if any proximal edema erythema or any systemic signs of infection were to occur to contact us immediately and go to the emergency room.  Discussed diabetic shoes and patient will have diabetic shoes made with a depression around the right first metatarsal of the right foot.  Patient to be seen back 1 week  X-rays indicate that there is no indications of ostial lysis or other indications of osteomyelitic condition.  Patient has 2 screws in the right first metatarsal phalangeal joint from previous fusion and does have moderate arthritic condition with spurring

## 2018-02-06 DIAGNOSIS — I2510 Atherosclerotic heart disease of native coronary artery without angina pectoris: Secondary | ICD-10-CM | POA: Diagnosis not present

## 2018-02-06 DIAGNOSIS — I428 Other cardiomyopathies: Secondary | ICD-10-CM | POA: Diagnosis not present

## 2018-02-06 DIAGNOSIS — I10 Essential (primary) hypertension: Secondary | ICD-10-CM | POA: Diagnosis not present

## 2018-02-06 DIAGNOSIS — E11621 Type 2 diabetes mellitus with foot ulcer: Secondary | ICD-10-CM | POA: Diagnosis not present

## 2018-02-08 ENCOUNTER — Ambulatory Visit: Payer: PPO | Admitting: Orthotics

## 2018-02-08 DIAGNOSIS — I428 Other cardiomyopathies: Secondary | ICD-10-CM | POA: Diagnosis not present

## 2018-02-08 DIAGNOSIS — E11621 Type 2 diabetes mellitus with foot ulcer: Secondary | ICD-10-CM | POA: Diagnosis not present

## 2018-02-08 DIAGNOSIS — I10 Essential (primary) hypertension: Secondary | ICD-10-CM | POA: Diagnosis not present

## 2018-02-08 DIAGNOSIS — E13621 Other specified diabetes mellitus with foot ulcer: Secondary | ICD-10-CM

## 2018-02-08 DIAGNOSIS — I739 Peripheral vascular disease, unspecified: Secondary | ICD-10-CM

## 2018-02-08 DIAGNOSIS — L97509 Non-pressure chronic ulcer of other part of unspecified foot with unspecified severity: Secondary | ICD-10-CM

## 2018-02-08 DIAGNOSIS — I2510 Atherosclerotic heart disease of native coronary artery without angina pectoris: Secondary | ICD-10-CM | POA: Diagnosis not present

## 2018-02-08 NOTE — Progress Notes (Signed)
Patient is here to be fitted for DBS per Dr. Paulla Dolly.  He presents with DM2, PN, and hx of ulceraton big toe and sub 1 R. Primary care is [name], MD Patient chose [facility] [id number]  Measured 14 W on [place]

## 2018-02-10 ENCOUNTER — Encounter: Payer: Self-pay | Admitting: Podiatry

## 2018-02-10 ENCOUNTER — Ambulatory Visit: Payer: PPO | Admitting: Podiatry

## 2018-02-10 DIAGNOSIS — I739 Peripheral vascular disease, unspecified: Secondary | ICD-10-CM

## 2018-02-10 DIAGNOSIS — L97521 Non-pressure chronic ulcer of other part of left foot limited to breakdown of skin: Secondary | ICD-10-CM

## 2018-02-13 NOTE — Progress Notes (Signed)
Subjective:   Patient ID: [name], male   DOB: 72 y.o.   MRN: [id number]   HPI Patient presents stating he seems to be improving and that the lesion on the left is not draining significantly and the one on the plantar right seems to be improving also and he is not having any pain   ROS      Objective:  Physical Exam  Neurovascular status unchanged with patient found to have small breakdown of tissue in the left hallux and also noted to have slight breakdown of tissue in the right plantar first metatarsal that is localized with no active drainage noted or proximal edema erythema noted.  It measures approximately 8 mm x 8 mm with 2 mm of depth     Assessment:  Improving ulceration with the one on the left foot almost completely healed and the one on the right foot showing a small ulceration which continues to improve with no proximal indication of infection     Plan:  H&P and conditions reviewed with patient.  Today I went ahead and debrided the tissue on the right foot flushed it out applied Iodosorb with sterile dressing and padding around the area and advised on home Betadine soaks.  This should heal uneventfully but if he gives any issues patient is to let us know immediately and we are waiting for his diabetic shoes to arrive

## 2018-02-15 ENCOUNTER — Telehealth: Payer: Self-pay

## 2018-02-15 ENCOUNTER — Ambulatory Visit: Payer: PPO | Admitting: Neurology

## 2018-02-15 NOTE — Telephone Encounter (Signed)
Pt did not show for their appt with Dr. Athar today.  

## 2018-02-16 ENCOUNTER — Encounter: Payer: Self-pay | Admitting: Neurology

## 2018-02-27 ENCOUNTER — Ambulatory Visit (INDEPENDENT_AMBULATORY_CARE_PROVIDER_SITE_OTHER): Payer: PPO | Admitting: Otolaryngology

## 2018-02-27 DIAGNOSIS — H6983 Other specified disorders of Eustachian tube, bilateral: Secondary | ICD-10-CM

## 2018-02-27 DIAGNOSIS — H903 Sensorineural hearing loss, bilateral: Secondary | ICD-10-CM | POA: Diagnosis not present

## 2018-03-07 ENCOUNTER — Ambulatory Visit (INDEPENDENT_AMBULATORY_CARE_PROVIDER_SITE_OTHER): Payer: PPO | Admitting: Orthotics

## 2018-03-07 DIAGNOSIS — R0989 Other specified symptoms and signs involving the circulatory and respiratory systems: Secondary | ICD-10-CM

## 2018-03-07 DIAGNOSIS — E13621 Other specified diabetes mellitus with foot ulcer: Secondary | ICD-10-CM | POA: Diagnosis not present

## 2018-03-07 DIAGNOSIS — L97521 Non-pressure chronic ulcer of other part of left foot limited to breakdown of skin: Secondary | ICD-10-CM

## 2018-03-07 DIAGNOSIS — I739 Peripheral vascular disease, unspecified: Secondary | ICD-10-CM | POA: Diagnosis not present

## 2018-03-07 DIAGNOSIS — L97509 Non-pressure chronic ulcer of other part of unspecified foot with unspecified severity: Secondary | ICD-10-CM

## 2018-03-07 NOTE — Progress Notes (Signed)

## 2018-03-21 DIAGNOSIS — H5000 Unspecified esotropia: Secondary | ICD-10-CM | POA: Diagnosis not present

## 2018-03-21 DIAGNOSIS — H532 Diplopia: Secondary | ICD-10-CM | POA: Diagnosis not present

## 2018-03-31 ENCOUNTER — Other Ambulatory Visit: Payer: Self-pay | Admitting: Podiatry

## 2018-03-31 ENCOUNTER — Other Ambulatory Visit: Payer: Self-pay | Admitting: Ophthalmology

## 2018-03-31 ENCOUNTER — Encounter: Payer: Self-pay | Admitting: Podiatry

## 2018-03-31 ENCOUNTER — Encounter

## 2018-03-31 ENCOUNTER — Ambulatory Visit: Payer: PPO | Admitting: Podiatry

## 2018-03-31 ENCOUNTER — Ambulatory Visit (INDEPENDENT_AMBULATORY_CARE_PROVIDER_SITE_OTHER): Payer: PPO

## 2018-03-31 DIAGNOSIS — L97519 Non-pressure chronic ulcer of other part of right foot with unspecified severity: Secondary | ICD-10-CM

## 2018-03-31 DIAGNOSIS — L97521 Non-pressure chronic ulcer of other part of left foot limited to breakdown of skin: Secondary | ICD-10-CM

## 2018-03-31 DIAGNOSIS — H532 Diplopia: Secondary | ICD-10-CM

## 2018-03-31 DIAGNOSIS — L97512 Non-pressure chronic ulcer of other part of right foot with fat layer exposed: Secondary | ICD-10-CM | POA: Diagnosis not present

## 2018-03-31 DIAGNOSIS — E08621 Diabetes mellitus due to underlying condition with foot ulcer: Secondary | ICD-10-CM | POA: Diagnosis not present

## 2018-03-31 DIAGNOSIS — L97501 Non-pressure chronic ulcer of other part of unspecified foot limited to breakdown of skin: Secondary | ICD-10-CM

## 2018-03-31 DIAGNOSIS — H4922 Sixth [abducent] nerve palsy, left eye: Secondary | ICD-10-CM | POA: Diagnosis not present

## 2018-03-31 MED ORDER — DOXYCYCLINE HYCLATE 100 MG PO CAPS: 100.0000 mg | ORAL_CAPSULE | Freq: Two times a day (BID) | ORAL | 0 refills | Status: AC

## 2018-03-31 NOTE — Progress Notes (Signed)
Subjective:   [name] is a 72 y.o. WM who presents to clinic for follow up of ulcer left great toe and submet head 1 right foot. He says he has some discomfort in his right foot today. He denies any open wounds.  Denies nausea, vomiting, fevers or chills.   He received his new diabetic shoes on 03/07/18.  Objective:    Ulceration #1 located left hallux.   Measurement predebridement: 0.5 x 0.2 x 0.1 cm. Measurement postdebridement: 1.0 x 0.5 x 0.2 cm No redness, streaking, lymphangitis noted. No probing to bone, no undermining, no active pus or purulence. No odor.  Ulceration #2: submetatarsal head 1 right foot Measurement predebridement: 0.5 x 1.0 x planar depth Measurement postdebridement: 1.0 x 1.5 x 0.1 cm  Ulcer located submetatarsal head 1 right foot with hyperkeratotic roof. There was flocculence noted superior and inferior to hyperkeratotic roof.  Post debridement, there is an underlying full thickness ulceration noted with red, granular base. Small amount of serous drainage noted. No tracking, no tunneling. Mild malodor.  Xrays right foot: No acute changes from 02/03/18 study. Hardware remains intact 1st MPJ fusion site. Accessory ossicles noted plantar aspect metatarsal heads 2, 3, 4, 5 right foot. No evidence of gas in tissues.  Assessment:  1. Diabetic ulceration left hallux 2. Diabetic ulceration submetatarsal head 1 right foot  Plan: 1. Discussed etiology, pathology, conservative  therapies and at this time office debridement was recommended   2. Ulcers were debrided and reactive hyperkeratoses and necrotic tissue was resected to the level of bleeding or viable tissue. No deep abscess, no erythema, no edema, no cellulitis encountered. Both ulcers cleansed with wound cleanser. Iodosorb gel applied to base of ulcers and covered with dressings. 3. Since he had some serous drainage from right foot, will start him on Doxycycline 100 mg po bid x 10 days. 4. Xray performed right  foot 3 views. 5. Completed form for [facility] and the following supplies were ordered: Iodosorb Gel, saline, gloves, cotton tip applicators, sterile gauze, bandages and tape. His wife will assist with dressing changes. He is to cleanse both wounds with saline, apply Iodosorb gel and light dressings to both ulcers once daily. 6. He is to keep both feet dry until wounds heal. 7. Follow up one week. 8. Should he experience elevated blood sugars, worsening of wounds, fever, chills, nightsweats, nausea, vomiting, foot swelling, foot drainage, increased redness of feet, he should report to ED. [name] related understanding.

## 2018-04-03 ENCOUNTER — Telehealth: Payer: Self-pay | Admitting: *Deleted

## 2018-04-03 NOTE — Telephone Encounter (Signed)
Dr. Elisha Ponder ordered Iodosorb for daily dressings to right sub 1st measuring 1.0 x 1.5 x 0.1cm with low exudate, and left hallux measuring 1.0 x 0.5 x 0.2cm with no exudate for dressing changes every other day, and antimicrovial rol gauze, 4 x 4 sterile gauze and 1 inch tape, faxed to [facility].

## 2018-04-05 ENCOUNTER — Other Ambulatory Visit: Payer: Self-pay | Admitting: Cardiovascular Disease

## 2018-04-05 DIAGNOSIS — I700 Atherosclerosis of aorta: Secondary | ICD-10-CM

## 2018-04-07 ENCOUNTER — Encounter: Payer: Self-pay | Admitting: Podiatry

## 2018-04-07 ENCOUNTER — Ambulatory Visit (INDEPENDENT_AMBULATORY_CARE_PROVIDER_SITE_OTHER): Payer: PPO | Admitting: Podiatry

## 2018-04-07 DIAGNOSIS — E08621 Diabetes mellitus due to underlying condition with foot ulcer: Secondary | ICD-10-CM | POA: Diagnosis not present

## 2018-04-07 DIAGNOSIS — L97511 Non-pressure chronic ulcer of other part of right foot limited to breakdown of skin: Secondary | ICD-10-CM | POA: Diagnosis not present

## 2018-04-07 DIAGNOSIS — M79674 Pain in right toe(s): Secondary | ICD-10-CM

## 2018-04-07 DIAGNOSIS — L97529 Non-pressure chronic ulcer of other part of left foot with unspecified severity: Secondary | ICD-10-CM

## 2018-04-07 DIAGNOSIS — E11621 Type 2 diabetes mellitus with foot ulcer: Secondary | ICD-10-CM

## 2018-04-07 DIAGNOSIS — M79675 Pain in left toe(s): Secondary | ICD-10-CM | POA: Diagnosis not present

## 2018-04-07 DIAGNOSIS — E1142 Type 2 diabetes mellitus with diabetic polyneuropathy: Secondary | ICD-10-CM

## 2018-04-07 DIAGNOSIS — B351 Tinea unguium: Secondary | ICD-10-CM

## 2018-04-08 ENCOUNTER — Other Ambulatory Visit: Payer: PPO

## 2018-04-08 ENCOUNTER — Inpatient Hospital Stay: Admission: RE | Admit: 2018-04-08 | Payer: PPO | Source: Ambulatory Visit

## 2018-04-09 ENCOUNTER — Other Ambulatory Visit: Payer: PPO

## 2018-04-09 NOTE — Progress Notes (Signed)
Subjective:   [name] presents to clinic for follow up of ulcer left great toe and submet head 1 right foot. He says he has received his dressing supplied which were mailed to his home from [place].  He states he has been keeping his foot dry. He states his right foot feels much better and his left great toe has pain from time to time.  He received his new diabetic shoes on 03/07/18.  Objective:    Neurovascular status unchanged.  Toenails 1-5 b/l  are thick, discolored dystrophic with subungual debris and pain with palpation due to thickness of nails.  Ulceration #1 located left hallux.   Measurement predebridement: 1.0 x 1.5 x 0.1 cm. Predebridement, ulcer has hyperkeratotic periphery and red, granular base. No erythema, no edema, no drainage, no flocculence, no warmth. No tracking/tunneling/undermining. Measurement postdebridement: 0.3 x 0.5 x 0.1 cm Postdebridement, base of ulcer is red, granular with viable wound edges. No redness, streaking, lymphangitis noted. No probing to bone, no undermining, no active pus or purulence. No odor. No tracking/tunneling/undermining.  Ulceration #2: submetatarsal head 1 right foot has resolved with hyperkeratotic roof and no underlying wound. No flocculence, no perilesion erythema, no edema, no pain on palpation. No drainage.  Last Xrays right foot 03/31/18:  No acute changes from 02/03/18 study. Hardware remains intact 1st MPJ fusion site. Accessory ossicles noted plantar aspect metatarsal heads 2, 3, 4, 5 right foot. No evidence of gas in tissues.  Assessment:  1. Diabetic ulceration left hallux 2. Diabetic ulceration submetatarsal head 1 right foot, resolved. 3. Painful onychomycosis 1-5 b/l 4. NIDDM with peripheral neuropathy  Plan: 1. Discussed etiology, pathology, conservative therapies and at this time, continued office debridement was recommended for left hallux.   2. Ulcer was debrided and reactive  hyperkeratosis was debrided to the level of bleeding or viable tissue. No deep abscess, no erythema, no edema, no cellulitis encountered. Left great toe ulcer was cleansed with wound cleanser. Iodosorb gel applied to base of ulcers and covered with light, secure dressing. 3. Continue Doxycycline 100 mg po bid until all gone. 4. No xrays indicated today as his wound has improved. 5. Continue daily dressing changes. He is to cleanse both wounds with saline, apply Iodosorb gel and light dressings to left hallux. 6. He is to keep both feet dry. 7. Follow up one week. 8. Should he experience elevated blood sugars, worsening of wounds, fever, chills, nightsweats, nausea, vomiting, foot swelling, foot drainage, increased redness of feet, he should report to ED. [name] related understanding.

## 2018-04-12 ENCOUNTER — Ambulatory Visit (HOSPITAL_COMMUNITY)
Admission: RE | Admit: 2018-04-12 | Discharge: 2018-04-12 | Disposition: A | Discharge status: Home / Self Care | Payer: PPO | Source: Ambulatory Visit | Attending: Cardiology | Admitting: Cardiology

## 2018-04-12 ENCOUNTER — Encounter (HOSPITAL_COMMUNITY): Payer: Self-pay

## 2018-04-12 DIAGNOSIS — I739 Peripheral vascular disease, unspecified: Secondary | ICD-10-CM | POA: Insufficient documentation

## 2018-04-12 DIAGNOSIS — I700 Atherosclerosis of aorta: Secondary | ICD-10-CM

## 2018-04-13 DIAGNOSIS — Z8673 Personal history of transient ischemic attack (TIA), and cerebral infarction without residual deficits: Secondary | ICD-10-CM | POA: Diagnosis not present

## 2018-04-13 DIAGNOSIS — H4910 Fourth [trochlear] nerve palsy, unspecified eye: Secondary | ICD-10-CM | POA: Diagnosis not present

## 2018-04-14 ENCOUNTER — Ambulatory Visit (INDEPENDENT_AMBULATORY_CARE_PROVIDER_SITE_OTHER): Payer: PPO | Admitting: Podiatry

## 2018-04-14 ENCOUNTER — Encounter: Payer: Self-pay | Admitting: Podiatry

## 2018-04-14 ENCOUNTER — Other Ambulatory Visit: Payer: Self-pay | Admitting: *Deleted

## 2018-04-14 DIAGNOSIS — E1142 Type 2 diabetes mellitus with diabetic polyneuropathy: Secondary | ICD-10-CM

## 2018-04-14 DIAGNOSIS — E11621 Type 2 diabetes mellitus with foot ulcer: Secondary | ICD-10-CM | POA: Diagnosis not present

## 2018-04-14 DIAGNOSIS — L97529 Non-pressure chronic ulcer of other part of left foot with unspecified severity: Secondary | ICD-10-CM

## 2018-04-14 DIAGNOSIS — I739 Peripheral vascular disease, unspecified: Secondary | ICD-10-CM

## 2018-04-17 ENCOUNTER — Ambulatory Visit: Payer: PPO | Admitting: Orthotics

## 2018-04-17 DIAGNOSIS — E08621 Diabetes mellitus due to underlying condition with foot ulcer: Secondary | ICD-10-CM

## 2018-04-17 DIAGNOSIS — L97511 Non-pressure chronic ulcer of other part of right foot limited to breakdown of skin: Principal | ICD-10-CM

## 2018-04-17 DIAGNOSIS — E1142 Type 2 diabetes mellitus with diabetic polyneuropathy: Secondary | ICD-10-CM

## 2018-04-17 NOTE — Progress Notes (Signed)
Marked ulcer areas with betadine, excavated material on bottom of f/o; also added p-lite "dancers pad" offload on surface. Covered with 1/16 p-cell to hold in place.

## 2018-04-17 NOTE — Progress Notes (Signed)
Subjective: [name] presents to clinic for follow up of diabetic ulcer left great toe and submet head 1 right foot. He says his left great toe feels much better on today.  He has brought in his old shoes and says his wife wants me to look at the wear pattern of his shoes.  He denies any worsening of wounds, erythema, edema, drainage, fever, chills, nightsweats, N/V.  He received his new diabetic shoes on 03/07/18.  Medications   New medications from outside sources are available for reconciliation   aspirin EC 81 MG EC tablet    b complex vitamins tablet    carvedilol (COREG) 3.125 MG tablet    clopidogrel (PLAVIX) 75 MG tablet    Cyanocobalamin ([employer] B-12) 1500 MCG TBDP    DULoxetine (CYMBALTA) 60 MG capsule    furosemide (LASIX) 40 MG tablet    gabapentin (NEURONTIN) 600 MG tablet    glimepiride (AMARYL) 4 MG tablet    levothyroxine (SYNTHROID, LEVOTHROID) 50 MCG tablet    lisinopril (PRINIVIL,ZESTRIL) 5 MG tablet    midodrine (PROAMATINE) 5 MG tablet    nitroGLYCERIN (NITROSTAT) 0.4 MG SL tablet    Omega 3-6-9 Fatty Acids (OMEGA 3-6-9 COMPLEX) CAPS    potassium chloride SA (K-DUR,KLOR-CON) 20 MEQ tablet    pramipexole (MIRAPEX) 0.25 MG tablet    pravastatin (PRAVACHOL) 40 MG tablet    rOPINIRole (REQUIP) 0.5 MG tablet    tamsulosin (FLOMAX) 0.4 MG CAPS capsule    Thiamine Mononitrate (VITAMIN B1 PO)    Allergies     CodeineNausea And Vomiting  TramadolNausea Only  Objective:   Neurovascular status unchanged.  Toenails 1-5 b/l  are thick, discolored dystrophic with subungual debris and pain with palpation due to thickness of nails.  Ulceration#1located left hallux.  Measurementpredebridement: 0.5 x 0.5 x planar depth. Predebridement, ulcer has hyperkeratotic roof and  Periphery with subdermal hemorrhage. No erythema, no edema, no drainage, no flocculence, no warmth. No tracking/tunneling/undermining. Postdebridement, area is epithelialized.  Noredness,  streaking, lymphangitis noted. No probing to bone, no undermining, no active pus or purulence. No odor. No tracking/tunneling/undermining.  Ulceration #2: submetatarsal head 1 right foot remains resolved with hyperkeratotic roof and no underlying wound. No flocculence, no perilesion erythema, no edema, no pain on palpation. No drainage.  Last Xrays right foot 03/31/18:  No acute changes from 02/03/18 study. Hardware remains intact 1st MPJ fusion site. Accessory ossicles noted plantar aspect metatarsal heads 2, 3, 4, 5 right foot. No evidence of gas in tissues.  Assessment: 1. Diabetic ulcerationleft hallux: resolved 2. Diabetic ulceration submetatarsal head 1 right foot, resolved. 3. NIDDM with peripheral neuropathy  Plan: 1.Discussed etiology, pathology, conservativetherapies and at this time, continued office debridement was recommended for left hallux.  2.Light dressing applied to left great toe and right foot. 3.  No xrays indicated today as his wound has resolved. 4. Will have Pedorthist offload areas of ulcerations to prevent recurrence.  5. Follow up two weeks. 8. Should he experience elevated blood sugars, worsening of wounds, fever, chills, nightsweats, nausea, vomiting, foot swelling, foot drainage, increased redness of feet, Mr. Noguez should report to ED. Mr. Klug related understanding.

## 2018-04-19 ENCOUNTER — Encounter: Payer: Self-pay | Admitting: Neurology

## 2018-04-19 ENCOUNTER — Ambulatory Visit: Payer: PPO | Admitting: Neurology

## 2018-04-19 VITALS — BP 118/76 | HR 98 | Ht 72.0 in | Wt 238.0 lb

## 2018-04-19 DIAGNOSIS — G2581 Restless legs syndrome: Secondary | ICD-10-CM

## 2018-04-19 DIAGNOSIS — G4761 Periodic limb movement disorder: Secondary | ICD-10-CM

## 2018-04-19 MED ORDER — PRAMIPEXOLE DIHYDROCHLORIDE 0.25 MG PO TABS: ORAL_TABLET | ORAL | 3 refills | Status: DC

## 2018-04-19 NOTE — Progress Notes (Signed)
Subjective:    Patient ID: [name] is a 72 y.o. male.  HPI    Interim history:   [name] is a 72 year old left-handed gentleman with an underlying medical history of cardiomyopathy, carotid artery occlusion and status post left carotid endarterectomy, orthostatic hypotension, obesity, hypertension, reflux disease, low back pain, type 2 diabetes, history of stroke, hypothyroidism, peripheral vascular disease, status post back surgery and status post neck surgery, who presents for follow-up consultation of his long-standing history of restless leg syndrome. The patient is unaccompanied today. Of note, he no showed for an appointment on 02/15/2018. I last saw him on 08/18/2017, at which time he reported that at times it felt that his whole body wanted to twitch. He tapered off the gabapentin which he felt was better. Adding the Mirapex in the evening helped. He had some lightheadedness upon standing. He was advised to make sure he drinks enough water was advised to consider using compression stockings. In addition, he was advised to increase his Mirapex to 2 pills at 7 PM and 2 pills at 9 PM, 0.25 mg strength each.  Today, 04/19/2018 (all dictated new, as well as above notes, some dictation done in note pad or Word, outside of chart, may appear as copied): He reports doing okay with legs, but does have Sx in arms. Arms seem to be worse at night, compared to legs. [place] recently, backwards, did no hit head. But started having cross eyed Sx, double vision. Saw ophthalmology. Has MRI brain and orbits on 04/13/18 and 04/14/18, which I reviewed through [place]. IMPRESSION: Normal MRI of the orbits. As noted on the brain MRI, there is a old lacunar infarct in the posterior and medial right side of the pons.   IMPRESSION: Normal MRI of the orbits. As noted on the brain MRI, there is a old lacunar infarct in the posterior and medial right side of the pons.   He appears to be on Requip 0.5 mg  strength. Not exactly sure when he started this. He is not sure how much he is taking and who prescribed it for him. He takes his water pill at night because when he was still working and would be very inconvenient to have to find a bathroom during the day while working. He is inclined to change his diuretic today time. He has noticed lower extremity swelling for quite some time.  The patient's allergies, current medications, family history, past medical history, past social history, past surgical history and problem list were reviewed and updated as appropriate.    Previously (copied from previous notes for reference):   I saw him on 05/18/2017, at which time he felt the Mirapex was helping only temporarily. He was also on gabapentin 600 mg at night. He is trying to take the Mirapex about 2 hours before his bedtime. He was tolerating Mirapex at 0.5 mg each night. His lower extremity swelling was about the same. He was planning on being compliant with his CPAP machine. I suggested he taper off the gabapentin. In lieu of that I increased his pramipexole to 0.25 mg at 7 PM and 0.5 mg at 9 PM.    I first met him on 02/15/2017 at the request of his primary care physician, at which time he reported a likely 30 year plus history of restless leg syndrome. He had been on high-dose gabapentin but stopped it on his own. He was on low-dose Mirapex and I suggested we increase this. He was advised to go  back on his CPAP for sleep apnea. He was also advised to reduce his caffeine intake.    02/15/2017: (He) reports restless legs symptoms for the past many years, likely over 30 years. I reviewed your office note from 01/26/2017, which you kindly included. He has been on Mirapex 0.25 mg strength for the past months, maybe since 12/17. He is also Cymbalta 60 mg twice daily. He presented to the emergency room recently on 01/25/2017 for a near syncopal event. I reviewed the emergency room records. Symptoms were suspected to  be secondary to volume depletion and he improved subjectively with fluids. He was recently tried on gabapentin, but felt worse and stopped it. His diabetic PN symptoms are worse since he stopped it, he was on 600 mg 4 times a day and stopped it all at once - I advised him never to stop this med "cold [place]".  He is a non-smoker, does not currently drinking alcohol currently, but drinks a significant amount of caffeine in the form of soda/cola, ("lots") 3 x 44 oz per day, by self report. He reports feeling restless in his body.  He has not used his CPAP in over 2 months, waiting for a cleaning machine for the CPAP. He had sleep study testing last year, he believes, but then recalls it was in 2014. I reviewed His sleep study report from 08/03/2013. He had a baseline sleep study at the [facility]. Sleep efficiency was only 60%, REM latency was 99 minutes, overall AHI was 26 per hour, average oxygen saturation was 90%, nadir was 77% during REM sleep. He had severe PLMS with an index of 127.7 per hour, PLM arousal index was elevated at 52.9 per hour. He recalls that he went back for a second sleep study for CPAP titration. Of note, he has noticed worsening of RLS symptoms since coming off of the CPAP. He has severe nocturia, about 5 times per night, takes his diuretic at night, although he is supposed to take it in the morning. He has never seen a urologist. He works at [employer], outside Pharmacologist, on the road a lot, therefore he does not like to take his diuretic during the day as he does not have access to bathrooms frequently. His bedtime is around 10 PM, he tries to take his restless legs medication an hour before. Wake up time is around 9 AM.   His Past Medical History Is Significant For: Past Medical History:  Diagnosis Date  . Arthritis   . Cardiomyopathy- EF NL 01/2013, now 30-35% echo 03/14/2012  . Carotid artery occlusion    left s/p CEA  . Diabetes mellitus   .  Hypertension   . Hypothyroidism   . Obstructive sleep apnea   . Orthostatic hypotension   . Peripheral arterial disease ([place]), s/p PTA x 2 RLE    nonhealing ulcers bilaterally on each great toe  . PONV (postoperative nausea and vomiting)   . Restless leg syndrome   . Shortness of breath   . Stroke [facility]) March 08, 2012    His Past Surgical History Is Significant For: Past Surgical History:  Procedure Laterality Date  . ANGIOPLASTY  02/28/14   diamond back orbital rotational atherectomy of Rt. tibial  . BACK SURGERY    . CARDIAC CATHETERIZATION N/A 05/19/2016   Procedure: Right/Left Heart Cath and Coronary Angiography;  Surgeon: Belva Crome, MD;  Location: [place] [facility];  Service: Cardiovascular;  Laterality: N/A;  . CERVICAL FUSION    .  ENDARTERECTOMY Left 11/29/2013   Procedure: ENDARTERECTOMY CAROTID;  Surgeon: Serafina Mitchell, MD;  Location[place];  Service: Vascular;  Laterality: Left;  . EYE SURGERY    . FOOT SURGERY    . Lower ext duplex doppler  03/14/14   Rt ABI 1.2  . LOWER EXTREMITY ANGIOGRAM Bilateral 02/18/2014   Procedure: LOWER EXTREMITY ANGIOGRAM;  Surgeon: [name], MD;  Location: [facility] CATH LAB;  Service: Cardiovascular;  Laterality: Bilateral;  . LOWER EXTREMITY ANGIOGRAM N/A 10/31/2014   Procedure: LOWER EXTREMITY ANGIOGRAM;  Surgeon: [name], MD;  Location: [facility] CATH LAB;  Service: Cardiovascular;  Laterality: N/A;  . PV angiogram  02/18/2014   tibial vessel diseas bil.  [name] SPINE SURGERY    . tendon achillies lengthing and sesamoid      His Family History Is Significant For: Family History  Problem Relation Age of Onset  . Heart disease Mother   . Hypertension Mother   . Heart attack Mother   . Hypertension Father   . Diabetes Father   . Diabetes Son   . Heart disease Son   . Hypertension Son     His Social History Is Significant For: Social History   Socioeconomic History  . Marital status: Married    Spouse name: Not on file  .  Number of children: Not on file  . Years of education: college  . Highest education level: Not on file  Occupational History  . Occupation: Firefighter   Social Needs  . Financial resource strain: Not on file  . Food insecurity:    Worry: Not on file    Inability: Not on file  . Transportation needs:    Medical: Not on file    Non-medical: Not on file  Tobacco Use  . Smoking status: Former Smoker    Years: 1.00    Types: Pipe    Last attempt to quit: 07/08/1977    Years since quitting: 40.8  . Smokeless tobacco: Never Used  Substance and Sexual Activity  . Alcohol use: No    Alcohol/week: 0.0 oz  . Drug use: No  . Sexual activity: Not on file  Lifestyle  . Physical activity:    Days per week: Not on file    Minutes per session: Not on file  . Stress: Not on file  Relationships  . Social connections:    Talks on phone: Not on file    Gets together: Not on file    Attends religious service: Not on file    Active member of club or organization: Not on file    Attends meetings of clubs or organizations: Not on file    Relationship status: Not on file  Other Topics Concern  . Not on file  Social History Narrative   Drinks 3 44oz cokes a day     His Allergies Are:  Allergies  Allergen Reactions  . Codeine Nausea And Vomiting  . Tramadol Nausea Only  :   His Current Medications Are:  Outpatient Encounter Medications as of 04/19/2018  Medication Sig  . aspirin EC 81 MG EC tablet Take 1 tablet (81 mg total) by mouth daily. (Patient taking differently: Take 81 mg by mouth at bedtime. )  . b complex vitamins tablet Take 1 tablet by mouth daily.  . carvedilol (COREG) 3.125 MG tablet TAKE ONE TABLET BY MOUTH TWICE A DAY WITH A MEAL  . clopidogrel (PLAVIX) 75 MG tablet Take 75 mg by mouth daily.   [name]  Cyanocobalamin ([facility] B-12) 1500 MCG TBDP Take 1 tablet by mouth daily.   . DULoxetine (CYMBALTA) 60 MG capsule Take 60 mg by mouth 2 (two) times daily.  . furosemide (LASIX)  40 MG tablet Take 1 tablet (40 mg total) by mouth 2 (two) times daily.  [name] gabapentin (NEURONTIN) 600 MG tablet Take 600 mg by mouth at bedtime.   [name] glimepiride (AMARYL) 4 MG tablet Take 4 mg by mouth daily with breakfast.  . levothyroxine (SYNTHROID, LEVOTHROID) 75 MCG tablet Take 75 mcg by mouth daily.  [name] lisinopril (PRINIVIL,ZESTRIL) 5 MG tablet TAKE ONE TABLET BY MOUTH TWICE A DAY  . midodrine (PROAMATINE) 5 MG tablet Take 5 mg by mouth daily as needed (WHEN bp LEVELS DROP).   . nitroGLYCERIN (NITROSTAT) 0.4 MG SL tablet Place 1 tablet (0.4 mg total) under the tongue every 5 (five) minutes as needed for chest pain.  . Omega 3-6-9 Fatty Acids (OMEGA 3-6-9 COMPLEX) CAPS Take by mouth.  . potassium chloride SA (K-DUR,KLOR-CON) 20 MEQ tablet Take 1 tablet (20 mEq total) by mouth daily.  . pramipexole (MIRAPEX) 0.25 MG tablet Take 2 pills at 7 PM and 2 pills at 9 PM daily.  . pravastatin (PRAVACHOL) 40 MG tablet Take 40 mg by mouth every morning.   [name] rOPINIRole (REQUIP) 0.5 MG tablet   . tamsulosin (FLOMAX) 0.4 MG CAPS capsule Take 1 capsule by mouth every morning.  . Thiamine Mononitrate (VITAMIN B1 PO) Take 1,000 mg by mouth 2 (two) times daily.  . [DISCONTINUED] levothyroxine (SYNTHROID, LEVOTHROID) 50 MCG tablet Take 50 mcg by mouth every morning.    No facility-administered encounter medications on file as of 04/19/2018.   :  Review of Systems:  Out of a complete 14 point review of systems, all are reviewed and negative with the exception of these symptoms as listed below: Review of Systems  Neurological:       Pt presents today for follow up. Pt reports a recent eye procedure for double vision.   Objective:  Neurological Exam  Physical Exam Physical Examination:   Vitals:   04/19/18 0916  BP: 118/76  Pulse: 98    General Examination: The patient is a very pleasant 72 y.o. male in no acute distress. He appears well-developed and well-nourished and well groomed.    HEENT:Normocephalic, atraumatic, pupils are equal, round and reactive to light and accommodation. S/p b/l cataract repairs, hearingis impaired.L eye patch, d/t diplopia. Wears corrective eyeglasses.Extraocular tracking is good without limitation to gaze excursion or nystagmus noted. Normal smooth pursuit is noted. Hearing is grossly intact. Face is symmetric with normal facial animation and normal facial sensation. Speech is clear with no dysarthria noted. There is no hypophonia. There is no lip, neck/head, jaw or voice tremor. Neck is supple with full range of passive and active motion. There are no carotid bruits on auscultation. Oropharynx exam reveals: mildmouth dryness, adequatedental hygiene with full dentures. Mallampati is class II. Tongue protrudes centrally and palate elevates symmetrically.   Chest:Clear to auscultation without wheezing, rhonchi or crackles noted.  Heart:S1+S2+0, regular and normal without murmurs, rubs or gallops noted.   Abdomen:Soft, non-tender and non-distended with normal bowel sounds appreciated on auscultation.  Extremities:There is1+ to 2+ pitting edema in the distal lower extremities bilaterally, seems worse.   Skin: Warm and dry without trophic changes noted. Bruising, chronic appearing throughout forearms.  Musculoskeletal: exam reveals no obvious joint deformities, tenderness or joint swelling or erythema.   Neurologically:  Mental status: The patient is  awake, alert and oriented in all 4 spheres. Hisimmediate and remote memory, attention, language skills and fund of knowledge are appropriate. There is no evidence of aphasia, agnosia, apraxia or anomia. Speech is clear with normal prosody and enunciation. Thought process is linear. Mood is normaland affect is normal.  Cranial nerves II - XII are as described above under HEENT exam. In addition: shoulder shrug is normal with equal shoulder height noted. Motor exam: Normal bulk, strength  and tone is noted. There is no drift, tremor or rebound. Reflexes are 1+ in the upper extremities, absent in the lower extremities.In the upper extremities for age, decreased in the lower extremities. Cerebellar testing: No dysmetria or intention tremor. There is no truncal or gait ataxia.  Sensory exam: intact to light touch in the distal lower extremities.  Gait, station and balance: Hestands with difficultyand has to push himself up. He stands wide-based. Romberg is not tested for safety reasons.Balance is impaired. He walks slowly and with difficulty, uses a single-point cane.    Assessment and Plan:  In summary, [name] a very pleasant 72 year old male with an underlyingcomplex medical historyof cardiomyopathy, carotid artery occlusion, with s/p L CEA, orthostatic hypotension, obesity, hypertension, reflux disease, low back pain, type 2 diabetes, history of stroke, hypothyroidism, peripheral vascular disease, status post back surgery and status post neck surgery, whopresents forfollow-up consultation of his long-standing history of RLS and PLMs. He could not tolerate gabapentin. He has been on Mirapex 0.25 mg strength 2 pills at 7 PM and 2 pills at 9 PM which has helped his leg symptoms. He is complaining about similar symptoms in his upper body and arms, particularly at night. I am reluctant to increase his pramipexole because of worsening lower extremity swelling. He's encouraged to take his diuretic during the day rather than at night because he does have sleep disruption from nocturia. Furthermore, he is advised not to take Requip and Mirapex together as both are dopamine agonists and have a similar side effect profile and can exacerbate restless leg symptoms and swelling. He is encouraged to talk to his prescribing doctor about tapering off of it. I renewed his prescription for Mirapex. I would like for him to make a follow-up appointment for 6 months with one of our nurse  practitioners. I answered all his questions today and he was in agreement.  I spent 25 minutes in total face-to-face time with the patient, more than 50% of which was spent in counseling and coordination of care, reviewing test results, reviewing medication and discussing or reviewing the diagnosis of PLMD and RLS, the prognosis and treatment options. Pertinent laboratory and imaging test results that were available during this visit with the patient were reviewed by me and considered in my medical decision making (see chart for details).

## 2018-04-19 NOTE — Patient Instructions (Addendum)
I would suggest we continue with the Mirapex 0.25 mg 2 pills at 7 PM and 2 at 9 PM. I would be reluctant to increase it due to swelling noted in legs.  More importantly, I would not recommend you take Requip too. It is NOT recommended, that you take Requip AND mirapex, as both medicines are of the same family of dopamine agonists. Pls talk to your prescribing physician about coming off the Requip.  I would like for you to come back in 6 months to see one of our nurse practitioners.

## 2018-04-28 ENCOUNTER — Ambulatory Visit: Payer: PPO | Admitting: Podiatry

## 2018-04-28 ENCOUNTER — Encounter: Payer: Self-pay | Admitting: Podiatry

## 2018-04-28 DIAGNOSIS — M2170 Unequal limb length (acquired), unspecified site: Secondary | ICD-10-CM

## 2018-04-28 DIAGNOSIS — L97411 Non-pressure chronic ulcer of right heel and midfoot limited to breakdown of skin: Secondary | ICD-10-CM | POA: Diagnosis not present

## 2018-04-28 DIAGNOSIS — E1142 Type 2 diabetes mellitus with diabetic polyneuropathy: Secondary | ICD-10-CM

## 2018-04-28 DIAGNOSIS — E11621 Type 2 diabetes mellitus with foot ulcer: Secondary | ICD-10-CM

## 2018-04-28 DIAGNOSIS — L97521 Non-pressure chronic ulcer of other part of left foot limited to breakdown of skin: Secondary | ICD-10-CM

## 2018-05-01 NOTE — Progress Notes (Signed)
Subjective: [name] presents to clinic for follow up of diabetic ulcer left great toe and submet head 1 right foot. He states his left great toe pain is nearly gone now. He relates no pain in his right foot.   He did see our [name] who made modifications to his diabetic insoles. [name] also recommended evaluation by physical therapy for limb length discrepancy.   [name] denies any worsening of wounds, erythema, edema, drainage, fever, chills, nightsweats, N/V.  He received diabetic insole modifications on 04/17/18.  Allergies     CodeineNausea And Vomiting  TramadolNausea Only    Objective:  Neurovascular status unchanged.  Toenails 1-5 b/l are thick, discolored dystrophic with subungual debris.  Toenails are adequate length on today.  Ulceration#1located left hallux.  Measurementpredebridement:0.3 x 0.4 x planar depth. Predebridement, ulcer has hyperkeratotic roof and Periphery with subdermal hemorrhage. No erythema, no edema, no drainage, no flocculence, no warmth. No tracking/tunneling/undermining. Postdebridement, area remains completely epithelialized. Noredness, streaking, lymphangitis noted. No probing to bone, no undermining, no active pus or purulence. No odor.No tracking/tunneling/undermining.  Ulceration #2: submetatarsal head 1 right footremains resolved with hyperkeratotic roof and no underlying wound. No flocculence, no perilesion erythema, no edema, no pain on palpation.No drainage.  LastXrays right foot7/12/19:  No acute changes from 02/03/18 study. Hardware remains intact 1st MPJ fusion site. Accessory ossicles noted plantar aspect metatarsal heads 2, 3, 4, 5 right foot. No evidence of gas in tissues.  Assessment: 1. Diabetic ulcerationleft hallux: resolved 2. Diabetic ulceration submetatarsal head 1 right foot, resolved. 3. NIDDM with peripheral neuropathy  Plan: 1.Discussed etiology, pathology,  conservativetherapies and at this time, continuedoffice debridement was recommendedfor left hallux. 2.Light dressing applied to left great toe and right foot. 3.No xrays indicated today as his wounds remain resolved. 4. Appreciate Pedorthist's recommendation. 5. Since he has gone 2 weeks without breakdown of ulcers, we will stretch him out an additional week. Follow up three weeks. 6. Referral sent to Physical therapy for evaluation and treatment regarding limb length discrepancy.  8. Should he experience elevated blood sugars, worsening of wounds, fever, chills, nightsweats, nausea, vomiting, foot swelling, foot drainage, increased redness of feet, [name] should report to ED. [name] related understanding.

## 2018-05-05 ENCOUNTER — Telehealth: Payer: Self-pay | Admitting: Podiatry

## 2018-05-05 ENCOUNTER — Other Ambulatory Visit: Payer: Self-pay

## 2018-05-05 MED ORDER — DOXYCYCLINE HYCLATE 100 MG PO TABS: 100.0000 mg | ORAL_TABLET | Freq: Two times a day (BID) | ORAL | 0 refills | Status: DC

## 2018-05-05 NOTE — Progress Notes (Signed)
Spoke with patient he states that his toe has recently become red, swollen, and painful.  He denies any wounds drainage or other symptoms to the area.  But he does state that the foot is red on top and very sensitive to the touch.  Doxycycline 100 mg twice daily x10 days was prescribed per Dr. Adah Perl.  As outpatient he is to follow-up with [place] next week, message was sent to schedulers to call patient on Monday to schedule him an appointment.  I did advise the patient that if symptoms worsen or fail to improve he was to go to emergency room immediately.

## 2018-05-05 NOTE — Telephone Encounter (Signed)
My left foot great toe seems to be infected/red/painful. Would you please call me in a antibiotic. I asked if pt was able to come in and see another Physician and he said NO. [place] in [place] and that wont be possible. Pt uses [facility].

## 2018-05-07 ENCOUNTER — Emergency Department (HOSPITAL_COMMUNITY): Payer: PPO

## 2018-05-07 ENCOUNTER — Other Ambulatory Visit: Payer: Self-pay

## 2018-05-07 ENCOUNTER — Inpatient Hospital Stay (HOSPITAL_COMMUNITY)
Admission: EM | Admit: 2018-05-07 | Discharge: 2018-05-11 | DRG: 638 | Disposition: A | Discharge status: Home Health | Payer: PPO | Attending: Pulmonary Disease | Admitting: Pulmonary Disease

## 2018-05-07 ENCOUNTER — Encounter (HOSPITAL_COMMUNITY): Payer: Self-pay | Admitting: *Deleted

## 2018-05-07 DIAGNOSIS — Z7902 Long term (current) use of antithrombotics/antiplatelets: Secondary | ICD-10-CM | POA: Diagnosis not present

## 2018-05-07 DIAGNOSIS — Z981 Arthrodesis status: Secondary | ICD-10-CM

## 2018-05-07 DIAGNOSIS — E1159 Type 2 diabetes mellitus with other circulatory complications: Secondary | ICD-10-CM | POA: Diagnosis not present

## 2018-05-07 DIAGNOSIS — E785 Hyperlipidemia, unspecified: Secondary | ICD-10-CM | POA: Diagnosis present

## 2018-05-07 DIAGNOSIS — L03116 Cellulitis of left lower limb: Secondary | ICD-10-CM

## 2018-05-07 DIAGNOSIS — I429 Cardiomyopathy, unspecified: Secondary | ICD-10-CM | POA: Diagnosis not present

## 2018-05-07 DIAGNOSIS — G2581 Restless legs syndrome: Secondary | ICD-10-CM | POA: Diagnosis not present

## 2018-05-07 DIAGNOSIS — E039 Hypothyroidism, unspecified: Secondary | ICD-10-CM | POA: Diagnosis not present

## 2018-05-07 DIAGNOSIS — I739 Peripheral vascular disease, unspecified: Secondary | ICD-10-CM

## 2018-05-07 DIAGNOSIS — Z7984 Long term (current) use of oral hypoglycemic drugs: Secondary | ICD-10-CM | POA: Diagnosis not present

## 2018-05-07 DIAGNOSIS — E119 Type 2 diabetes mellitus without complications: Secondary | ICD-10-CM | POA: Diagnosis present

## 2018-05-07 DIAGNOSIS — E1142 Type 2 diabetes mellitus with diabetic polyneuropathy: Secondary | ICD-10-CM | POA: Diagnosis present

## 2018-05-07 DIAGNOSIS — Z7982 Long term (current) use of aspirin: Secondary | ICD-10-CM

## 2018-05-07 DIAGNOSIS — E1151 Type 2 diabetes mellitus with diabetic peripheral angiopathy without gangrene: Secondary | ICD-10-CM | POA: Diagnosis present

## 2018-05-07 DIAGNOSIS — L03032 Cellulitis of left toe: Secondary | ICD-10-CM

## 2018-05-07 DIAGNOSIS — E11621 Type 2 diabetes mellitus with foot ulcer: Secondary | ICD-10-CM | POA: Diagnosis present

## 2018-05-07 DIAGNOSIS — Z885 Allergy status to narcotic agent status: Secondary | ICD-10-CM | POA: Diagnosis not present

## 2018-05-07 DIAGNOSIS — Z87891 Personal history of nicotine dependence: Secondary | ICD-10-CM

## 2018-05-07 DIAGNOSIS — E1169 Type 2 diabetes mellitus with other specified complication: Secondary | ICD-10-CM | POA: Diagnosis not present

## 2018-05-07 DIAGNOSIS — E7800 Pure hypercholesterolemia, unspecified: Secondary | ICD-10-CM | POA: Diagnosis not present

## 2018-05-07 DIAGNOSIS — M868X7 Other osteomyelitis, ankle and foot: Secondary | ICD-10-CM | POA: Diagnosis present

## 2018-05-07 DIAGNOSIS — R600 Localized edema: Secondary | ICD-10-CM | POA: Diagnosis not present

## 2018-05-07 DIAGNOSIS — M7989 Other specified soft tissue disorders: Secondary | ICD-10-CM | POA: Diagnosis not present

## 2018-05-07 DIAGNOSIS — I6523 Occlusion and stenosis of bilateral carotid arteries: Secondary | ICD-10-CM

## 2018-05-07 DIAGNOSIS — Z79899 Other long term (current) drug therapy: Secondary | ICD-10-CM

## 2018-05-07 DIAGNOSIS — G629 Polyneuropathy, unspecified: Secondary | ICD-10-CM | POA: Diagnosis not present

## 2018-05-07 DIAGNOSIS — I420 Dilated cardiomyopathy: Secondary | ICD-10-CM | POA: Diagnosis not present

## 2018-05-07 DIAGNOSIS — E1165 Type 2 diabetes mellitus with hyperglycemia: Secondary | ICD-10-CM | POA: Diagnosis not present

## 2018-05-07 DIAGNOSIS — Z888 Allergy status to other drugs, medicaments and biological substances status: Secondary | ICD-10-CM

## 2018-05-07 DIAGNOSIS — I10 Essential (primary) hypertension: Secondary | ICD-10-CM | POA: Diagnosis present

## 2018-05-07 DIAGNOSIS — L97509 Non-pressure chronic ulcer of other part of unspecified foot with unspecified severity: Secondary | ICD-10-CM | POA: Diagnosis present

## 2018-05-07 DIAGNOSIS — M79672 Pain in left foot: Secondary | ICD-10-CM | POA: Diagnosis not present

## 2018-05-07 DIAGNOSIS — L03119 Cellulitis of unspecified part of limb: Secondary | ICD-10-CM | POA: Diagnosis not present

## 2018-05-07 DIAGNOSIS — Z8673 Personal history of transient ischemic attack (TIA), and cerebral infarction without residual deficits: Secondary | ICD-10-CM | POA: Diagnosis not present

## 2018-05-07 DIAGNOSIS — I779 Disorder of arteries and arterioles, unspecified: Secondary | ICD-10-CM | POA: Diagnosis present

## 2018-05-07 DIAGNOSIS — G4733 Obstructive sleep apnea (adult) (pediatric): Secondary | ICD-10-CM | POA: Diagnosis not present

## 2018-05-07 HISTORY — DX: Cellulitis of left lower limb: L03.116

## 2018-05-07 LAB — BASIC METABOLIC PANEL
Anion gap: 8 (ref 5–15)
BUN: 16 mg/dL (ref 8–23)
CO2: 23 mmol/L (ref 22–32)
Calcium: 8.6 mg/dL — ABNORMAL LOW (ref 8.9–10.3)
Chloride: 102 mmol/L (ref 98–111)
Creatinine, Ser: 0.83 mg/dL (ref 0.61–1.24)
GFR calc Af Amer: >60 mL/min (ref >60)
GFR calc non Af Amer: >60 mL/min (ref >60)
Glucose, Bld: 140 mg/dL — ABNORMAL HIGH (ref 70–99)
Potassium: 3.7 mmol/L (ref 3.5–5.1)
Sodium: 133 mmol/L — ABNORMAL LOW (ref 135–145)

## 2018-05-07 LAB — HEMOGLOBIN A1C
Hgb A1c MFr Bld: 6.9 % — ABNORMAL HIGH (ref 4.8–5.6)
Mean Plasma Glucose: 151.33 mg/dL

## 2018-05-07 LAB — CBC WITH DIFFERENTIAL/PLATELET
Basophils Absolute: 0 10*3/uL (ref 0.0–0.1)
Basophils Relative: 0 %
Eosinophils Absolute: 0 10*3/uL (ref 0.0–0.7)
Eosinophils Relative: 0 %
HCT: 33.6 % — ABNORMAL LOW (ref 39.0–52.0)
Hemoglobin: 11.4 g/dL — ABNORMAL LOW (ref 13.0–17.0)
Lymphocytes Relative: 9 %
Lymphs Abs: 0.8 10*3/uL (ref 0.7–4.0)
MCH: 33.6 pg (ref 26.0–34.0)
MCHC: 33.9 g/dL (ref 30.0–36.0)
MCV: 99.1 fL (ref 78.0–100.0)
Monocytes Absolute: 1 10*3/uL (ref 0.1–1.0)
Monocytes Relative: 10 %
Neutro Abs: 7.6 10*3/uL (ref 1.7–7.7)
Neutrophils Relative %: 81 %
Platelets: 232 10*3/uL (ref 150–400)
RBC: 3.39 MIL/uL — ABNORMAL LOW (ref 4.22–5.81)
RDW: 13.4 % (ref 11.5–15.5)
WBC: 9.4 10*3/uL (ref 4.0–10.5)

## 2018-05-07 LAB — LACTIC ACID, PLASMA
Lactic Acid, Venous: 0.9 mmol/L (ref 0.5–1.9)
Lactic Acid, Venous: 0.9 mmol/L (ref 0.5–1.9)

## 2018-05-07 LAB — GLUCOSE, CAPILLARY
Glucose-Capillary: 173 mg/dL — ABNORMAL HIGH (ref 70–99)
Glucose-Capillary: 135 mg/dL — ABNORMAL HIGH (ref 70–99)

## 2018-05-07 LAB — SEDIMENTATION RATE: Sed Rate: 58 mm/hr — ABNORMAL HIGH (ref 0–16)

## 2018-05-07 LAB — C-REACTIVE PROTEIN: CRP: 18.5 mg/dL — ABNORMAL HIGH (ref <1.0)

## 2018-05-07 LAB — PREALBUMIN: Prealbumin: 12.2 mg/dL — ABNORMAL LOW (ref 18–38)

## 2018-05-07 MED ORDER — TAMSULOSIN HCL 0.4 MG PO CAPS
0.4000 mg | ORAL_CAPSULE | Freq: Every morning | ORAL | Status: DC
  Administered 2018-05-07 – 2018-05-11 (×5): 0.4 mg via ORAL
  Filled 2018-05-07 (×5): qty 1

## 2018-05-07 MED ORDER — DULOXETINE HCL 60 MG PO CPEP
60.0000 mg | ORAL_CAPSULE | Freq: Two times a day (BID) | ORAL | Status: DC
  Administered 2018-05-07 – 2018-05-11 (×9): 60 mg via ORAL
  Filled 2018-05-07 (×9): qty 1

## 2018-05-07 MED ORDER — CLOPIDOGREL BISULFATE 75 MG PO TABS
75.0000 mg | ORAL_TABLET | Freq: Every day | ORAL | Status: DC
  Administered 2018-05-07 – 2018-05-11 (×5): 75 mg via ORAL
  Filled 2018-05-07 (×5): qty 1

## 2018-05-07 MED ORDER — SACCHAROMYCES BOULARDII 250 MG PO CAPS
250.0000 mg | ORAL_CAPSULE | Freq: Two times a day (BID) | ORAL | Status: DC
  Administered 2018-05-07 – 2018-05-11 (×9): 250 mg via ORAL
  Filled 2018-05-07 (×9): qty 1

## 2018-05-07 MED ORDER — VANCOMYCIN HCL IN DEXTROSE 1-5 GM/200ML-% IV SOLN
1000.0000 mg | Freq: Two times a day (BID) | INTRAVENOUS | Status: DC
  Administered 2018-05-08 – 2018-05-11 (×7): 1000 mg via INTRAVENOUS
  Filled 2018-05-07 (×8): qty 200

## 2018-05-07 MED ORDER — ACETAMINOPHEN 650 MG RE SUPP: 650.0000 mg | Freq: Four times a day (QID) | RECTAL | Status: DC | PRN

## 2018-05-07 MED ORDER — VITAMIN B-1 100 MG PO TABS
1000.0000 mg | ORAL_TABLET | Freq: Two times a day (BID) | ORAL | Status: DC
  Administered 2018-05-08 – 2018-05-09 (×2): 1000 mg via ORAL
  Filled 2018-05-07: qty 10

## 2018-05-07 MED ORDER — PRAVASTATIN SODIUM 40 MG PO TABS
40.0000 mg | ORAL_TABLET | Freq: Every day | ORAL | Status: DC
  Administered 2018-05-07 – 2018-05-10 (×4): 40 mg via ORAL
  Filled 2018-05-07 (×4): qty 1

## 2018-05-07 MED ORDER — ACETAMINOPHEN 325 MG PO TABS: 650.0000 mg | ORAL_TABLET | Freq: Four times a day (QID) | ORAL | Status: DC | PRN

## 2018-05-07 MED ORDER — SODIUM CHLORIDE 0.9% FLUSH: 3.0000 mL | INTRAVENOUS | Status: DC | PRN

## 2018-05-07 MED ORDER — LEVOTHYROXINE SODIUM 75 MCG PO TABS
75.0000 ug | ORAL_TABLET | Freq: Every day | ORAL | Status: DC
  Administered 2018-05-08 – 2018-05-11 (×4): 75 ug via ORAL
  Filled 2018-05-07 (×4): qty 1

## 2018-05-07 MED ORDER — SODIUM CHLORIDE 0.9 % IV SOLN: 1.0000 g | Freq: Once | INTRAVENOUS | Status: DC

## 2018-05-07 MED ORDER — VANCOMYCIN HCL 10 G IV SOLR
2000.0000 mg | Freq: Once | INTRAVENOUS | Status: AC
  Administered 2018-05-07: 2000 mg via INTRAVENOUS
  Filled 2018-05-07: qty 2000

## 2018-05-07 MED ORDER — TRAZODONE HCL 50 MG PO TABS
25.0000 mg | ORAL_TABLET | Freq: Every evening | ORAL | Status: DC | PRN
  Administered 2018-05-07: 25 mg via ORAL
  Filled 2018-05-07: qty 1

## 2018-05-07 MED ORDER — ONDANSETRON HCL 4 MG/2ML IJ SOLN
4.0000 mg | Freq: Four times a day (QID) | INTRAMUSCULAR | Status: DC | PRN
  Administered 2018-05-07: 4 mg via INTRAVENOUS
  Filled 2018-05-07: qty 2

## 2018-05-07 MED ORDER — SENNOSIDES-DOCUSATE SODIUM 8.6-50 MG PO TABS: 1.0000 | ORAL_TABLET | Freq: Every evening | ORAL | Status: DC | PRN

## 2018-05-07 MED ORDER — SODIUM CHLORIDE 0.9 % IV SOLN: 250.0000 mL | INTRAVENOUS | Status: DC | PRN

## 2018-05-07 MED ORDER — LISINOPRIL 5 MG PO TABS
5.0000 mg | ORAL_TABLET | Freq: Two times a day (BID) | ORAL | Status: DC
  Administered 2018-05-07 (×2): 5 mg via ORAL
  Filled 2018-05-07 (×3): qty 1

## 2018-05-07 MED ORDER — CARVEDILOL 3.125 MG PO TABS
3.1250 mg | ORAL_TABLET | Freq: Two times a day (BID) | ORAL | Status: DC
  Administered 2018-05-07 – 2018-05-11 (×8): 3.125 mg via ORAL
  Filled 2018-05-07 (×8): qty 1

## 2018-05-07 MED ORDER — SODIUM CHLORIDE 0.9% FLUSH
3.0000 mL | Freq: Two times a day (BID) | INTRAVENOUS | Status: DC
  Administered 2018-05-07 – 2018-05-08 (×2): 3 mL via INTRAVENOUS

## 2018-05-07 MED ORDER — FUROSEMIDE 40 MG PO TABS
40.0000 mg | ORAL_TABLET | Freq: Two times a day (BID) | ORAL | Status: DC
  Administered 2018-05-07 – 2018-05-08 (×2): 40 mg via ORAL
  Filled 2018-05-07 (×2): qty 1

## 2018-05-07 MED ORDER — ENOXAPARIN SODIUM 40 MG/0.4ML ~~LOC~~ SOLN
40.0000 mg | SUBCUTANEOUS | Status: DC
  Administered 2018-05-07 – 2018-05-10 (×4): 40 mg via SUBCUTANEOUS
  Filled 2018-05-07 (×4): qty 0.4

## 2018-05-07 MED ORDER — NITROGLYCERIN 0.4 MG SL SUBL: 0.4000 mg | SUBLINGUAL_TABLET | SUBLINGUAL | Status: DC | PRN

## 2018-05-07 MED ORDER — ONDANSETRON HCL 4 MG PO TABS: 4.0000 mg | ORAL_TABLET | Freq: Four times a day (QID) | ORAL | Status: DC | PRN

## 2018-05-07 MED ORDER — SODIUM CHLORIDE 0.9 % IV SOLN
1.0000 g | Freq: Three times a day (TID) | INTRAVENOUS | Status: DC
  Administered 2018-05-07 – 2018-05-11 (×12): 1 g via INTRAVENOUS
  Filled 2018-05-07 (×17): qty 1

## 2018-05-07 MED ORDER — VANCOMYCIN HCL IN DEXTROSE 1-5 GM/200ML-% IV SOLN: 1000.0000 mg | Freq: Once | INTRAVENOUS | Status: DC

## 2018-05-07 MED ORDER — ASPIRIN EC 81 MG PO TBEC
81.0000 mg | DELAYED_RELEASE_TABLET | Freq: Every day | ORAL | Status: DC
  Administered 2018-05-07 – 2018-05-10 (×4): 81 mg via ORAL
  Filled 2018-05-07 (×4): qty 1

## 2018-05-07 MED ORDER — INSULIN ASPART 100 UNIT/ML ~~LOC~~ SOLN
0.0000 [IU] | Freq: Every day | SUBCUTANEOUS | Status: DC
  Administered 2018-05-08: 2 [IU] via SUBCUTANEOUS

## 2018-05-07 MED ORDER — VITAMIN B-12 1000 MCG PO TABS
1500.0000 ug | ORAL_TABLET | Freq: Every day | ORAL | Status: DC
  Administered 2018-05-07 – 2018-05-11 (×5): 1500 ug via ORAL
  Filled 2018-05-07 (×5): qty 2

## 2018-05-07 MED ORDER — INSULIN ASPART 100 UNIT/ML ~~LOC~~ SOLN
0.0000 [IU] | Freq: Three times a day (TID) | SUBCUTANEOUS | Status: DC
  Administered 2018-05-07: 3 [IU] via SUBCUTANEOUS
  Administered 2018-05-08 (×2): 2 [IU] via SUBCUTANEOUS
  Administered 2018-05-09 – 2018-05-10 (×3): 3 [IU] via SUBCUTANEOUS
  Administered 2018-05-10 (×2): 2 [IU] via SUBCUTANEOUS
  Administered 2018-05-11 (×2): 3 [IU] via SUBCUTANEOUS

## 2018-05-07 MED ORDER — POTASSIUM CHLORIDE CRYS ER 20 MEQ PO TBCR
20.0000 meq | EXTENDED_RELEASE_TABLET | Freq: Every day | ORAL | Status: DC
  Administered 2018-05-07 – 2018-05-11 (×5): 20 meq via ORAL
  Filled 2018-05-07 (×5): qty 1

## 2018-05-07 MED ORDER — CLINDAMYCIN PHOSPHATE 900 MG/50ML IV SOLN
900.0000 mg | Freq: Once | INTRAVENOUS | Status: AC
  Administered 2018-05-07: 900 mg via INTRAVENOUS
  Filled 2018-05-07: qty 50

## 2018-05-07 MED ORDER — OXYCODONE HCL 5 MG PO TABS
5.0000 mg | ORAL_TABLET | ORAL | Status: DC | PRN
  Administered 2018-05-07 – 2018-05-10 (×5): 5 mg via ORAL
  Filled 2018-05-07 (×5): qty 1

## 2018-05-07 MED ORDER — PRAMIPEXOLE DIHYDROCHLORIDE 1 MG PO TABS
0.5000 mg | ORAL_TABLET | ORAL | Status: DC
  Administered 2018-05-07 – 2018-05-10 (×7): 0.5 mg via ORAL
  Filled 2018-05-07 (×9): qty 1

## 2018-05-07 MED ORDER — ROPINIROLE HCL 0.25 MG PO TABS
0.5000 mg | ORAL_TABLET | Freq: Every day | ORAL | Status: DC
  Administered 2018-05-07 – 2018-05-10 (×4): 0.5 mg via ORAL
  Filled 2018-05-07 (×4): qty 2

## 2018-05-07 NOTE — H&P (Addendum)
History and Physical  [name] WVP:[id number] DOB: 10-Jun-1946 DOA: 05/07/2018  Referring physician: Thurnell Garbe   PCP: [name], MD   Chief Complaint: left foot pain and swelling  HPI: [name] is a 72 y.o. male diabetic with history of PVD presented to ED c/o swelling, redness and pain to left foot x 4 days. Pt was placed on Doxycycline on Friday by his podiatrist after he had called them requesting that antibiotics be called in. Pt was not seen by foot doctor but they sent him a prescription for antibiotics and arranged to have an appt later this week. Pt reports the swelling, redness and pain was only in the great toe on the left foot at that time and has now spread to the half of the left foot. Pt reports nausea and vomiting x 1  that started 2 days ago. Denies abdominal pain.  Pt has had fever up to 102 at home.  He had been taking the antibiotics as prescribed.  He denies nausea and vomiting.  He has no headache, chest pain or shortness of breath.  The reason that he presented to the ED primarily is because the redness and swelling appeared to be getting worse and spreading.   ED Course:  The patient was seen in ED but was not found to be febrile.  He had a normal WBC and lactate.  He was sent for xray of left foot that was positive for changes of cellulitis but suspicious for possible osteomyelitis and MRI was recommended.  The patient was started on IV antibiotics after blood cultures were obtained and admission was requested.    Review of Systems: All systems reviewed and apart from history of presenting illness, are negative.  Past Medical History:  Diagnosis Date  . Arthritis   . Cardiomyopathy- EF NL 01/2013, now 30-35% echo 03/14/2012  . Carotid artery occlusion    left s/p CEA  . Diabetes mellitus   . Hypertension   . Hypothyroidism   . Obstructive sleep apnea   . Orthostatic hypotension   . Peripheral arterial disease ([place]), s/p PTA x 2 RLE    nonhealing  ulcers bilaterally on each great toe  . PONV (postoperative nausea and vomiting)   . Restless leg syndrome   . Shortness of breath   . Stroke [facility]) March 08, 2012   Past Surgical History:  Procedure Laterality Date  . ANGIOPLASTY  02/28/14   diamond back orbital rotational atherectomy of Rt. tibial  . BACK SURGERY    . CARDIAC CATHETERIZATION N/A 05/19/2016   Procedure: Right/Left Heart Cath and Coronary Angiography;  Surgeon: Belva Crome, MD;  Location: [facility];  Service: Cardiovascular;  Laterality: N/A;  . CERVICAL FUSION    . ENDARTERECTOMY Left 11/29/2013   Procedure: ENDARTERECTOMY CAROTID;  Surgeon: Serafina Mitchell, MD;  Location: [facility];  Service: Vascular;  Laterality: Left;  . EYE SURGERY    . FOOT SURGERY    . Lower ext duplex doppler  03/14/14   Rt ABI 1.2  . LOWER EXTREMITY ANGIOGRAM Bilateral 02/18/2014   Procedure: LOWER EXTREMITY ANGIOGRAM;  Surgeon: [name], MD;  Location[facility] C[facility] LAB;  Service: Cardiovascular;  Laterality: Bilateral;  . LOWER EXTREMITY ANGIOGRAM N/A 10/31/2014   Procedure: LOWER EXTREMITY ANGIOGRAM;  Surgeon: [name], MD;  Location[facility] C[facility] LAB;  Service: Cardiovascular;  Laterality: N/A;  . PV angiogram  02/18/2014   tibial vessel diseas bil.  [name] SPINE SURGERY    .  tendon achillies lengthing and sesamoid     Social History:  reports that he quit smoking about 40 years ago. His smoking use included pipe. He quit after 1.00 year of use. He has never used smokeless tobacco. He reports that he does not drink alcohol or use drugs.  Allergies  Allergen Reactions  . Codeine Nausea And Vomiting  . Tramadol Nausea Only    Family History  Problem Relation Age of Onset  . Heart disease Mother   . Hypertension Mother   . Heart attack Mother   . Hypertension Father   . Diabetes Father   . Diabetes Son   . Heart disease Son   . Hypertension Son     Prior to Admission medications   Medication Sig Start Date End Date Taking?  Authorizing Provider  aspirin EC 81 MG EC tablet Take 1 tablet (81 mg total) by mouth daily. Patient taking differently: Take 81 mg by mouth at bedtime.  05/21/16   Barrett, Evelene Croon, PA-C  b complex vitamins tablet Take 1 tablet by mouth daily.    [provider]  carvedilol (COREG) 3.125 MG tablet TAKE ONE TABLET BY MOUTH TWICE A DAY WITH A MEAL 06/20/17   [name], MD  clopidogrel (PLAVIX) 75 MG tablet Take 75 mg by mouth daily.  06/16/13   [provider]  Cyanocobalamin ([employer] B-12) 1500 MCG TBDP Take 1 tablet by mouth daily.     [provider]  doxycycline (VIBRA-TABS) 100 MG tablet Take 1 tablet (100 mg total) by mouth 2 (two) times daily. 05/05/18   Marzetta Board, MD  DULoxetine (CYMBALTA) 60 MG capsule Take 60 mg by mouth 2 (two) times daily. 01/20/17   [provider]  furosemide (LASIX) 40 MG tablet Take 1 tablet (40 mg total) by mouth 2 (two) times daily. 04/18/17   [name], MD  glimepiride (AMARYL) 4 MG tablet Take 4 mg by mouth daily with breakfast.    [provider]  levothyroxine (SYNTHROID, LEVOTHROID) 75 MCG tablet Take 75 mcg by mouth daily. 02/14/18   [provider]  lisinopril (PRINIVIL,ZESTRIL) 5 MG tablet TAKE ONE TABLET BY MOUTH TWICE A DAY 04/18/17   [name], MD  midodrine (PROAMATINE) 5 MG tablet Take 5 mg by mouth daily as needed (WHEN bp LEVELS DROP).     [provider]  nitroGLYCERIN (NITROSTAT) 0.4 MG SL tablet Place 1 tablet (0.4 mg total) under the tongue every 5 (five) minutes as needed for chest pain. 05/22/16   Barrett, Evelene Croon, PA-C  Omega 3-6-9 Fatty Acids (OMEGA 3-6-9 COMPLEX) CAPS Take by mouth.    [provider]  potassium chloride SA (K-DUR,KLOR-CON) 20 MEQ tablet Take 1 tablet (20 mEq total) by mouth daily. 04/18/17   [name], MD  pramipexole (MIRAPEX) 0.25 MG tablet Take 2 pills at 7 PM and 2 pills at 9 PM daily. 04/19/18   Star Age, MD    pravastatin (PRAVACHOL) 40 MG tablet Take 40 mg by mouth every morning.  10/06/12   [provider]  rOPINIRole (REQUIP) 0.5 MG tablet  01/24/18   [provider]  tamsulosin (FLOMAX) 0.4 MG CAPS capsule Take 1 capsule by mouth every morning. 01/21/17   [provider]  Thiamine Mononitrate (VITAMIN B1 PO) Take 1,000 mg by mouth 2 (two) times daily.    [provider]   Physical Exam: Vitals:   05/07/18 1130 05/07/18 1215 05/07/18 1230 05/07/18 1300  BP: 124/76  134/76 (!) 146/88  Pulse: 85 84 80 83  Resp:      Temp:      TempSrc:      SpO2: 95% 96% 94% 98%  Weight:      Height:        General exam: Moderately built and nourished patient, lying comfortably supine on the gurney in no obvious distress.  Head, eyes and ENT: Nontraumatic and normocephalic. Pupils equally reacting to light and accommodation. Oral mucosa moist.  Neck: Supple. No JVD, carotid bruit or thyromegaly.  Lymphatics: No lymphadenopathy.  Respiratory system: Clear to auscultation. No increased work of breathing.  Cardiovascular system: S1 and S2 heard, RRR. No JVD, murmurs, gallops, clicks or pedal edema.  Gastrointestinal system: Abdomen is nondistended, soft and nontender. Normal bowel sounds heard. No organomegaly or masses appreciated.  Central nervous system: Alert and oriented. No focal neurological deficits.  Extremities: left foot with redness and swelling along great toe and medial side of foot consistent with cellulitis no abscess seen.  No drainage seen.  Symmetric 5 x 5 power. Peripheral pulses symmetrically felt.      Skin: redness, heat, edema around 1st toe left foot with surrounding erythema advancing proximally up medial foot.   Musculoskeletal system: Negative exam.  Psychiatry: Pleasant and cooperative.  Labs on Admission:  Basic Metabolic Panel: Recent Labs  Lab 05/07/18 1142  NA 133*  K 3.7  CL 102  CO2 23  GLUCOSE 140*  BUN 16  CREATININE  0.83  CALCIUM 8.6*   Liver Function Tests: No results for input(s): AST, ALT, ALKPHOS, BILITOT, PROT, ALBUMIN in the last 168 hours. No results for input(s): LIPASE, AMYLASE in the last 168 hours. No results for input(s): AMMONIA in the last 168 hours. CBC: Recent Labs  Lab 05/07/18 1142  WBC 9.4  NEUTROABS 7.6  HGB 11.4*  HCT 33.6*  MCV 99.1  PLT 232   Cardiac Enzymes: No results for input(s): CKTOTAL, CKMB, CKMBINDEX, TROPONINI in the last 168 hours.  BNP (last 3 results) No results for input(s): PROBNP in the last 8760 hours. CBG: No results for input(s): GLUCAP in the last 168 hours.  Radiological Exams on Admission: Dg Foot Complete Left  Result Date: 05/07/2018 CLINICAL DATA:  Right great toe and foot red and painful, pt stated it began 4 days ago, worsening today, prior would of left great toe EXAM: LEFT FOOT - COMPLETE 3+ VIEW COMPARISON:  11/16/2016 FINDINGS: Degenerative changes are mild-to-moderate, most significant at the first metatarsophalangeal joint. There is diffuse soft tissue swelling about the midfoot and forefoot with more focal moderate soft tissue swelling about the first digit. No soft tissue gas. Although there is no gross osseous destruction, there is localized osteopenia in the distal great toe, for which osteolysis cannot be excluded. No radiopaque foreign object. IMPRESSION: Soft tissue swelling about the great toe and remainder of the forefoot, most consistent with cellulitis. Localized osteopenia about the distal first digit, for which osteolysis cannot be excluded. If osteomyelitis is a clinical concern, consider further evaluation with pre and postcontrast MRI. Electronically Signed   By: Abigail Miyamoto M.D.   On: 05/07/2018 12:44   Assessment/Plan Principal Problem:   Cellulitis of left foot Active Problems:   History of stroke June 2013   Hypertension   Type 2 diabetes mellitus with circulatory disorder ([place])   Peripheral neuropathy    Cardiomyopathy- EF NL 01/2013, now 30-35% echo   Hyperlipidemia   Peripheral arterial disease ([place])  Restless leg syndrome   Obstructive sleep apnea   Bilateral carotid artery disease ([place])  1. Cellulitis of left foot - there is some concern about osteomyelitis - Obtain MRI of left foot pre and post contrast administration for further evaluation and clarification.  The cellulitis order set bundle has been started.  Continue IV broad spectrum antibiotics and follow clinical response.  Follow blood cultures.  ESR and CRP pending.  Pain medication ordered as needed.  2. Type 2 diabetes mellitus with circulatory complications - check A5B. Start CBGs and supplemental sliding scale coverage.   3. OSA - will provide nightly CPAP.   4. PVD - check ABI study of left lower extremity.   5. Cardiomyopathy - stable, compensated, resume home medication regimen.  6. Essential Hypertension - resume home treatments.  7. RLS - resume home treatment regimen. 8. Diabetic peripheral polyneuropathy - resume home cymbalta.    DVT Prophylaxis: lovenox Code Status: Full   Family Communication: at bedside  Disposition Plan: Home when medically stable   Time spent: 20 mins   Irwin Brakeman, MD [facility] Pager [phone]  If 7PM-7AM, please contact night-coverage [email or web address] Password [email or web address] 05/07/2018, 1:33 PM

## 2018-05-07 NOTE — Progress Notes (Signed)
  Pharmacy Antibiotic Note  [name] is a 72 y.o. male admitted on 05/07/2018 with cellulitis.  Pharmacy has been consulted for vancomycin and meropenem dosing.  Plan: start meropenem 1g IV q8h Give vancomycin 2g IV x1 dose, the start vancomycin 1g IV q12h   Goal vancomycin trough range:  10-15  mcg/mL Pharmacy will continue to monitor renal function, vancomycin troughs as clinically indicated, cultures and patient progress.   Height: 6' (182.9 cm) Weight: 232 lb (105.2 kg) IBW/kg (Calculated) : 77.6  Temp (24hrs), Avg:97.6 F (36.4 C), Min:97.6 F (36.4 C), Max:97.6 F (36.4 C)  Recent Labs  Lab 05/07/18 1142 05/07/18 1317  WBC 9.4  --   CREATININE 0.83  --   LATICACIDVEN 0.9 0.9    Estimated Creatinine Clearance: 97.9 mL/min (by C-G formula based on SCr of 0.83 mg/dL).    Allergies  Allergen Reactions  . Codeine Nausea And Vomiting  . Tramadol Nausea Only    Antimicrobials this admission: 8/18 clindamycin >> 8/18 8/18 meropenem >>  8/18 vancomycin >>  Microbiology results: 8/18 BCx2: pending   MRSA PCR:    Thank you for allowing pharmacy to be a part of this patient's care.  Despina Pole, Pharm. D. Clinical Pharmacist 05/07/2018 2:22 PM

## 2018-05-07 NOTE — ED Triage Notes (Signed)
Pt c/o swelling, redness and pain to left foot x 4 days. Pt was placed on Doxycycline on Friday by his foot doctor. Pt was not seen by foot doctor he just called in and they sent him a prescription for antibiotics and to have an appt this week. Pt reports the swelling, redness and pain was only in the great toe on the left foot at that time and has now spread to the half of the left foot. Pt reports nausea and vomiting x 1  that started 2 days ago. Denies abdominal pain.

## 2018-05-07 NOTE — ED Provider Notes (Signed)
[facility] EMERGENCY DEPARTMENT Provider Note   CSN: [id number] Arrival date & time: 05/07/18  1010     History   Chief Complaint Chief Complaint  Patient presents with  . Foot Swelling  . Nausea    HPI [name] is a 72 y.o. male.     Pt was seen at 1115. Per pt and his family, c/o gradual onset and worsening of persistent left foot "redness" and "swelling" for the past 4 days. Pt states 4 days ago his left great dorsal toe began to "get red" and "swell." Pt states he called his Podiatrist 2 days ago, was rx doxycycline, and an appointment was scheduled with them this coming week. Pt states he had home fever to "102" yesterday, and the redness has progressed from his toe to his left dorsal foot. Pt states he has hx of chronic non-healing wound on his left great medial toe "but that finally healed up." Also endorses peripheral neuropathy in his feet. Denies injury, no focal motor weakness, no abd pain, no back pain, no CP/SOB.    Podiatrist: [facility] Past Medical History:  Diagnosis Date  . Arthritis   . Cardiomyopathy- EF NL 01/2013, now 30-35% echo 03/14/2012  . Carotid artery occlusion    left s/p CEA  . Diabetes mellitus   . Hypertension   . Hypothyroidism   . Obstructive sleep apnea   . Orthostatic hypotension   . Peripheral arterial disease ([place]), s/p PTA x 2 RLE    nonhealing ulcers bilaterally on each great toe  . PONV (postoperative nausea and vomiting)   . Restless leg syndrome   . Shortness of breath   . Stroke [facility]) March 08, 2012    Patient Active Problem List   Diagnosis Date Noted  . Cellulitis of left foot 05/07/2018  . Orthostatic hypotension 08/25/2016  . Arrhythmia 05/20/2016  . Angina decubitus ([place]) 05/19/2016  . Congestive heart failure ([place]) 05/17/2016  . Pain in the chest 05/17/2016  . Pulmonary nodule 05/17/2016  . Elevated troponin 04/11/2016  . Nonhealing skin ulcer ([place]) 10/28/2014  . Bilateral carotid artery disease  ([place]) 07/08/2014  . Critical lower limb ischemia 02/28/2014  . Obstructive sleep apnea 02/12/2014  . Aftercare following surgery of the circulatory system, [place] 12/04/2013  . Restless leg syndrome 10/23/2013  . Obesity (BMI 30.0-34.9) 10/23/2013  . Hyperlipidemia 07/16/2013  . Peripheral arterial disease ([place]) 07/16/2013  . History of stroke June 2013 03/14/2012  . Hypertension 03/14/2012  . Type 2 diabetes mellitus with circulatory disorder ([place]) 03/14/2012  . Peripheral neuropathy 03/14/2012  . Cardiomyopathy- EF NL 01/2013, now 30-35% echo 03/14/2012  . ARTHRITIS, RIGHT FOOT 06/26/2008    Past Surgical History:  Procedure Laterality Date  . ANGIOPLASTY  02/28/14   diamond back orbital rotational atherectomy of Rt. tibial  . BACK SURGERY    . CARDIAC CATHETERIZATION N/A 05/19/2016   Procedure: Right/Left Heart Cath and Coronary Angiography;  Surgeon: Belva Crome, MD;  Location: [place] CV LAB;  Service: Cardiovascular;  Laterality: N/A;  . CERVICAL FUSION    . ENDARTERECTOMY Left 11/29/2013   Procedure: ENDARTERECTOMY CAROTID;  Surgeon: Serafina Mitchell, MD;  Location: [place];  Service: Vascular;  Laterality: Left;  . EYE SURGERY    . FOOT SURGERY    . Lower ext duplex doppler  03/14/14   Rt ABI 1.2  . LOWER EXTREMITY ANGIOGRAM Bilateral 02/18/2014   Procedure: LOWER EXTREMITY ANGIOGRAM;  Surgeon: [name], MD;  Location: [place] CATH LAB;  Service: Cardiovascular;  Laterality: Bilateral;  . LOWER EXTREMITY ANGIOGRAM N/A 10/31/2014   Procedure: LOWER EXTREMITY ANGIOGRAM;  Surgeon: [name], MD;  Location: [facility] CATH LAB;  Service: Cardiovascular;  Laterality: N/A;  . PV angiogram  02/18/2014   tibial vessel diseas bil.  [name] SPINE SURGERY    . tendon achillies lengthing and sesamoid          Home Medications    Prior to Admission medications   Medication Sig Start Date End Date Taking? Authorizing Provider  aspirin EC 81 MG EC tablet Take 1 tablet (81 mg total) by mouth  daily. Patient taking differently: Take 81 mg by mouth at bedtime.  05/21/16   Barrett, Evelene Croon, PA-C  b complex vitamins tablet Take 1 tablet by mouth daily.    [provider]  carvedilol (COREG) 3.125 MG tablet TAKE ONE TABLET BY MOUTH TWICE A DAY WITH A MEAL 06/20/17   [name], MD  clopidogrel (PLAVIX) 75 MG tablet Take 75 mg by mouth daily.  06/16/13   [provider]  Cyanocobalamin ([employer] B-12) 1500 MCG TBDP Take 1 tablet by mouth daily.     [provider]  doxycycline (VIBRA-TABS) 100 MG tablet Take 1 tablet (100 mg total) by mouth 2 (two) times daily. 05/05/18   Marzetta Board, MD  DULoxetine (CYMBALTA) 60 MG capsule Take 60 mg by mouth 2 (two) times daily. 01/20/17   [provider]  furosemide (LASIX) 40 MG tablet Take 1 tablet (40 mg total) by mouth 2 (two) times daily. 04/18/17   [name], MD  glimepiride (AMARYL) 4 MG tablet Take 4 mg by mouth daily with breakfast.    [provider]  levothyroxine (SYNTHROID, LEVOTHROID) 75 MCG tablet Take 75 mcg by mouth daily. 02/14/18   [provider]  lisinopril (PRINIVIL,ZESTRIL) 5 MG tablet TAKE ONE TABLET BY MOUTH TWICE A DAY 04/18/17   [name], MD  midodrine (PROAMATINE) 5 MG tablet Take 5 mg by mouth daily as needed (WHEN bp LEVELS DROP).     [provider]  nitroGLYCERIN (NITROSTAT) 0.4 MG SL tablet Place 1 tablet (0.4 mg total) under the tongue every 5 (five) minutes as needed for chest pain. 05/22/16   Barrett, Evelene Croon, PA-C  potassium chloride SA (K-DUR,KLOR-CON) 20 MEQ tablet Take 1 tablet (20 mEq total) by mouth daily. 04/18/17   [name], MD  pramipexole (MIRAPEX) 0.25 MG tablet Take 2 pills at 7 PM and 2 pills at 9 PM daily. 04/19/18   Star Age, MD  pravastatin (PRAVACHOL) 40 MG tablet Take 40 mg by mouth every morning.  10/06/12   [provider]  rOPINIRole (REQUIP) 0.5 MG tablet  01/24/18   [provider]  tamsulosin  (FLOMAX) 0.4 MG CAPS capsule Take 1 capsule by mouth every morning. 01/21/17   [provider]  Thiamine Mononitrate (VITAMIN B1 PO) Take 1,000 mg by mouth 2 (two) times daily.    [provider]    Family History Family History  Problem Relation Age of Onset  . Heart disease Mother   . Hypertension Mother   . Heart attack Mother   . Hypertension Father   . Diabetes Father   . Diabetes Son   . Heart disease Son   . Hypertension Son     Social History Social History   Tobacco Use  . Smoking status: Former Smoker    Years: 1.00  Types: Pipe    Last attempt to quit: 07/08/1977    Years since quitting: 40.8  . Smokeless tobacco: Never Used  Substance Use Topics  . Alcohol use: No    Alcohol/week: 0.0 standard drinks  . Drug use: No     Allergies   Codeine and Tramadol   Review of Systems Review of Systems ROS: Statement: All systems negative except as marked or noted in the HPI; Constitutional: Negative for fever and chills. ; ; Eyes: Negative for eye pain, redness and discharge. ; ; ENMT: Negative for ear pain, hoarseness, nasal congestion, sinus pressure and sore throat. ; ; Cardiovascular: Negative for chest pain, palpitations, diaphoresis, dyspnea and peripheral edema. ; ; Respiratory: Negative for cough, wheezing and stridor. ; ; Gastrointestinal: Negative for nausea, vomiting, diarrhea, abdominal pain, blood in stool, hematemesis, jaundice and rectal bleeding. . ; ; Genitourinary: Negative for dysuria, flank pain and hematuria. ; ; Musculoskeletal: Negative for back pain and neck pain. Negative for swelling and trauma.; ; Skin: +left foot redness. Negative for pruritus, abrasions, blisters, bruising and skin lesion.; ; Neuro: Negative for headache, lightheadedness and neck stiffness. Negative for weakness, altered level of consciousness, altered mental status, extremity weakness, paresthesias, involuntary movement, seizure and syncope.      Physical  Exam Updated Vital Signs BP (!) 146/88   Pulse 83   Temp 97.6 F (36.4 C) (Oral)   Resp 16   Ht 6' (1.829 m)   Wt 105.2 kg   SpO2 98%   BMI 31.46 kg/m   Physical Exam 1120: Physical examination:  Nursing notes reviewed; Vital signs and O2 SAT reviewed;  Constitutional: Well developed, Well nourished, Well hydrated, In no acute distress; Head:  Normocephalic, atraumatic; Eyes: EOMI, PERRL, No scleral icterus; ENMT: Mouth and pharynx normal, Mucous membranes moist; Neck: Supple, Full range of motion, No lymphadenopathy; Cardiovascular: Regular rate and rhythm, No gallop; Respiratory: Breath sounds clear & equal bilaterally, No wheezes.  Speaking full sentences with ease, Normal respiratory effort/excursion; Chest: Nontender, Movement normal; Abdomen: Soft, Nontender, Nondistended, Normal bowel sounds; Genitourinary: No CVA tenderness; Extremities: Peripheral pulses normal, +TTP left great toe. +left great toe and forefoot with edema and erythema, 2 small healed wounds left medial great toe, no open wounds, no drainage.  No calf edema or asymmetry.; Neuro: AA&Ox3, +left eye patched. No facial droop. Speech clear. No gross focal motor or sensory deficits in extremities.; Skin: Color normal, Warm, Dry.   ED Treatments / Results  Labs (all labs ordered are listed, but only abnormal results are displayed)   EKG None  Radiology   Procedures Procedures (including critical care time)  Medications Ordered in ED Medications  clindamycin (CLEOCIN) IVPB 900 mg (900 mg Intravenous New Bag/Given 05/07/18 1348)  oxyCODONE (Oxy IR/ROXICODONE) immediate release tablet 5 mg (has no administration in time range)     Initial Impression / Assessment and Plan / ED Course  I have reviewed the triage vital signs and the nursing notes.  Pertinent labs & imaging results that were available during my care of the patient were reviewed by me and considered in my medical decision making (see chart for  details).  MDM Reviewed: previous chart, nursing note and vitals Reviewed previous: labs Interpretation: labs and x-ray   Results for orders placed or performed during the hospital encounter of 05/07/18  Culture, blood (routine x 2)  Result Value Ref Range   Specimen Description RIGHT ANTECUBITAL    Special Requests      BOTTLES DRAWN  AEROBIC AND ANAEROBIC Blood Culture adequate volume Performed at [facility], [address], [place], [place] [address]    Culture PENDING    Report Status PENDING   Culture, blood (routine x 2)  Result Value Ref Range   Specimen Description RIGHT ANTECUBITAL    Special Requests      BOTTLES DRAWN AEROBIC AND ANAEROBIC Blood Culture adequate volume Performed at [facility], [address] [place], [place] [address]    Culture PENDING    Report Status PENDING   Lactic acid, plasma  Result Value Ref Range   Lactic Acid, Venous 0.9 0.5 - 1.9 mmol/L  Lactic acid, plasma  Result Value Ref Range   Lactic Acid, Venous 0.9 0.5 - 1.9 mmol/L  CBC with Differential  Result Value Ref Range   WBC 9.4 4.0 - 10.5 K/uL   RBC 3.39 (L) 4.22 - 5.81 MIL/uL   Hemoglobin 11.4 (L) 13.0 - 17.0 g/dL   HCT 33.6 (L) 39.0 - 52.0 %   MCV 99.1 78.0 - 100.0 fL   MCH 33.6 26.0 - 34.0 pg   MCHC 33.9 30.0 - 36.0 g/dL   RDW 13.4 11.5 - 15.5 %   Platelets 232 150 - 400 K/uL   Neutrophils Relative % 81 %   Neutro Abs 7.6 1.7 - 7.7 K/uL   Lymphocytes Relative 9 %   Lymphs Abs 0.8 0.7 - 4.0 K/uL   Monocytes Relative 10 %   Monocytes Absolute 1.0 0.1 - 1.0 K/uL   Eosinophils Relative 0 %   Eosinophils Absolute 0.0 0.0 - 0.7 K/uL   Basophils Relative 0 %   Basophils Absolute 0.0 0.0 - 0.1 K/uL  Basic metabolic panel  Result Value Ref Range   Sodium 133 (L) 135 - 145 mmol/L   Potassium 3.7 3.5 - 5.1 mmol/L   Chloride 102 98 - 111 mmol/L   CO2 23 22 - 32 mmol/L   Glucose, Bld 140 (H) 70 - 99 mg/dL   BUN 16 8 - 23 mg/dL   Creatinine, Ser 0.83 0.61 - 1.24 mg/dL    Calcium 8.6 (L) 8.9 - 10.3 mg/dL   GFR calc non Af Amer >60 >60 mL/min   GFR calc Af Amer >60 >60 mL/min   Anion gap 8 5 - 15   Dg Foot Complete Left Result Date: 05/07/2018 CLINICAL DATA:  Right great toe and foot red and painful, pt stated it began 4 days ago, worsening today, prior would of left great toe EXAM: LEFT FOOT - COMPLETE 3+ VIEW COMPARISON:  11/16/2016 FINDINGS: Degenerative changes are mild-to-moderate, most significant at the first metatarsophalangeal joint. There is diffuse soft tissue swelling about the midfoot and forefoot with more focal moderate soft tissue swelling about the first digit. No soft tissue gas. Although there is no gross osseous destruction, there is localized osteopenia in the distal great toe, for which osteolysis cannot be excluded. No radiopaque foreign object. IMPRESSION: Soft tissue swelling about the great toe and remainder of the forefoot, most consistent with cellulitis. Localized osteopenia about the distal first digit, for which osteolysis cannot be excluded. If osteomyelitis is a clinical concern, consider further evaluation with pre and postcontrast MRI. Electronically Signed   By: Abigail Miyamoto M.D.   On: 05/07/2018 12:44    1315:  IV clindamycin given after BC obtained. No fever while in the ED. Dx and testing d/w pt and family.  Questions answered.  Verb understanding, agreeable to admit. T/C returned from [facility]. Wynetta Emery, case  discussed, including:  HPI, pertinent PM/SHx, VS/PE, dx testing, ED course and treatment:  Agreeable to admit.     Final Clinical Impressions(s) / ED Diagnoses   Final diagnoses:  Cellulitis of left foot  Cellulitis of great toe of left foot    ED Discharge Orders    None       Francine Graven, DO 05/11/18 0009

## 2018-05-08 ENCOUNTER — Inpatient Hospital Stay (HOSPITAL_COMMUNITY): Payer: PPO

## 2018-05-08 LAB — CBC WITH DIFFERENTIAL/PLATELET
Basophils Absolute: 0 10*3/uL (ref 0.0–0.1)
Basophils Relative: 0 %
Eosinophils Absolute: 0 10*3/uL (ref 0.0–0.7)
Eosinophils Relative: 0 %
HCT: 31.5 % — ABNORMAL LOW (ref 39.0–52.0)
Hemoglobin: 10.4 g/dL — ABNORMAL LOW (ref 13.0–17.0)
Lymphocytes Relative: 12 %
Lymphs Abs: 0.9 10*3/uL (ref 0.7–4.0)
MCH: 33 pg (ref 26.0–34.0)
MCHC: 33 g/dL (ref 30.0–36.0)
MCV: 100 fL (ref 78.0–100.0)
Monocytes Absolute: 0.6 10*3/uL (ref 0.1–1.0)
Monocytes Relative: 8 %
Neutro Abs: 6.1 10*3/uL (ref 1.7–7.7)
Neutrophils Relative %: 80 %
Platelets: 246 10*3/uL (ref 150–400)
RBC: 3.15 MIL/uL — ABNORMAL LOW (ref 4.22–5.81)
RDW: 13.5 % (ref 11.5–15.5)
WBC: 7.7 10*3/uL (ref 4.0–10.5)

## 2018-05-08 LAB — GLUCOSE, CAPILLARY
Glucose-Capillary: 148 mg/dL — ABNORMAL HIGH (ref 70–99)
Glucose-Capillary: 144 mg/dL — ABNORMAL HIGH (ref 70–99)
Glucose-Capillary: 103 mg/dL — ABNORMAL HIGH (ref 70–99)
Glucose-Capillary: 214 mg/dL — ABNORMAL HIGH (ref 70–99)

## 2018-05-08 LAB — COMPREHENSIVE METABOLIC PANEL
ALT: 16 U/L (ref 0–44)
AST: 17 U/L (ref 15–41)
Albumin: 3.1 g/dL — ABNORMAL LOW (ref 3.5–5.0)
Alkaline Phosphatase: 53 U/L (ref 38–126)
Anion gap: 11 (ref 5–15)
BUN: 20 mg/dL (ref 8–23)
CO2: 25 mmol/L (ref 22–32)
Calcium: 8.6 mg/dL — ABNORMAL LOW (ref 8.9–10.3)
Chloride: 100 mmol/L (ref 98–111)
Creatinine, Ser: 0.98 mg/dL (ref 0.61–1.24)
GFR calc Af Amer: >60 mL/min (ref >60)
GFR calc non Af Amer: >60 mL/min (ref >60)
Glucose, Bld: 178 mg/dL — ABNORMAL HIGH (ref 70–99)
Potassium: 3.8 mmol/L (ref 3.5–5.1)
Sodium: 136 mmol/L (ref 135–145)
Total Bilirubin: 0.8 mg/dL (ref 0.3–1.2)
Total Protein: 6.4 g/dL — ABNORMAL LOW (ref 6.5–8.1)

## 2018-05-08 LAB — MAGNESIUM: Magnesium: 2 mg/dL (ref 1.7–2.4)

## 2018-05-08 MED ORDER — SODIUM CHLORIDE 0.9 % IV SOLN
INTRAVENOUS | Status: DC
  Administered 2018-05-08 – 2018-05-11 (×6): via INTRAVENOUS

## 2018-05-08 MED ORDER — SODIUM CHLORIDE 0.9% FLUSH
10.0000 mL | Freq: Two times a day (BID) | INTRAVENOUS | Status: DC
  Administered 2018-05-08 – 2018-05-11 (×6): 10 mL

## 2018-05-08 MED ORDER — FUROSEMIDE 40 MG PO TABS
40.0000 mg | ORAL_TABLET | Freq: Every day | ORAL | Status: DC
  Administered 2018-05-09 – 2018-05-11 (×3): 40 mg via ORAL
  Filled 2018-05-08 (×3): qty 1

## 2018-05-08 MED ORDER — ENSURE ENLIVE PO LIQD
237.0000 mL | Freq: Two times a day (BID) | ORAL | Status: DC
  Administered 2018-05-08 – 2018-05-11 (×6): 237 mL via ORAL

## 2018-05-08 MED ORDER — GADOBENATE DIMEGLUMINE 529 MG/ML IV SOLN
20.0000 mL | Freq: Once | INTRAVENOUS | Status: AC | PRN
  Administered 2018-05-08: 20 mL via INTRAVENOUS

## 2018-05-08 MED ORDER — SODIUM CHLORIDE 0.9% FLUSH: 10.0000 mL | INTRAVENOUS | Status: DC | PRN

## 2018-05-08 NOTE — Progress Notes (Signed)
Peripherally Inserted Central Catheter/Midline Placement  The IV Nurse has discussed with the patient and/or persons authorized to consent for the patient, the purpose of this procedure and the potential benefits and risks involved with this procedure.  The benefits include less needle sticks, lab draws from the catheter, and the patient may be discharged home with the catheter. Risks include, but not limited to, infection, bleeding, blood clot (thrombus formation), and puncture of an artery; nerve damage and irregular heartbeat and possibility to perform a PICC exchange if needed/ordered by physician.  Alternatives to this procedure were also discussed.  B[employer] Power PICC patient education guide, fact sheet on infection prevention and patient information card has been provided to patient /or left at bedside.    PICC/Midline Placement Documentation  PICC Single Lumen 38/10/17 PICC Right Basilic 42 cm 0 cm (Active)  Indication for Insertion or Continuance of Line Home intravenous therapies (PICC only) 05/08/2018  7:38 PM  Exposed Catheter (cm) 0 cm 05/08/2018  7:38 PM  Site Assessment Clean;Dry;Intact 05/08/2018  7:38 PM  Line Status Flushed;Saline locked;Blood return noted 05/08/2018  7:38 PM  Dressing Type Transparent 05/08/2018  7:38 PM  Dressing Status Clean;Dry;Intact;Antimicrobial disc in place 05/08/2018  7:38 PM  Dressing Change Due 05/15/18 05/08/2018  7:38 PM       Gordan Payment 05/08/2018, 7:39 PM

## 2018-05-08 NOTE — Progress Notes (Signed)
MD rounding and made aware of low BPs and patient reports of feeling light headed.  Orders placed to hold lisinopril.

## 2018-05-08 NOTE — Progress Notes (Signed)
Went into room d/t bed alarm going off, patient attempting to stand to use urinal. Once standing, blood and pus noted to drain from top of left great toe. Cleansed after assisting back to bed, no further drainage noted. Will continue to monitor.

## 2018-05-08 NOTE — Progress Notes (Signed)
Subjective: This is a 72 year old with multiple medical problems including peripheral vascular disease coronary disease diabetes hypertension and foot ulceration.  He has had problems with his left foot for some time was started on doxycycline about 3 days ago by his podiatrist because he was having increasing swelling and pain.  He came to the emergency department because he had a fever of 102 and had increasing swelling that now has spread to the half of his left foot.  X-ray was suggestive of osteomyelitis and he is pending MRI.  He is having some drainage out of the foot now.  Objective: Vital signs in last 24 hours: Temp:  [97.6 F (36.4 C)-98.1 F (36.7 C)] 98.1 F (36.7 C) (08/19 0535) Pulse Rate:  [73-93] 76 (08/19 0535) Resp:  [16-20] 18 (08/19 0535) BP: (92-146)/(41-88) 102/52 (08/19 0600) SpO2:  [94 %-99 %] 99 % (08/19 0535) Weight:  [103.2 kg-105.2 kg] 103.2 kg (08/18 1502) Weight change:  Last BM Date: 05/06/18  Intake/Output from previous day: 08/18 0701 - 08/19 0700 In: 1190 [P.O.:240; IV Piggyback:950] Out: 350 [Urine:350]  PHYSICAL EXAM General appearance: alert, cooperative and mild distress Resp: clear to auscultation bilaterally Cardio: regular rate and rhythm, S1, S2 normal, no murmur, click, rub or gallop GI: soft, non-tender; bowel sounds normal; no masses,  no organomegaly Extremities: Foot appears similar to the picture taken on admission.  He has swelling and erythema of the medial half of his left foot.  He has drainage from the top of his left great toe.  Lab Results:  Results for orders placed or performed during the hospital encounter of 05/07/18 (from the past 48 hour(s))  Lactic acid, plasma     Status: None   Collection Time: 05/07/18 11:42 AM  Result Value Ref Range   Lactic Acid, Venous 0.9 0.5 - 1.9 mmol/L    Comment: Performed at [facility], [address], [place], [place] [address]  CBC with Differential     Status: Abnormal   Collection  Time: 05/07/18 11:42 AM  Result Value Ref Range   WBC 9.4 4.0 - 10.5 K/uL   RBC 3.39 (L) 4.22 - 5.81 MIL/uL   Hemoglobin 11.4 (L) 13.0 - 17.0 g/dL   HCT 33.6 (L) 39.0 - 52.0 %   MCV 99.1 78.0 - 100.0 fL   MCH 33.6 26.0 - 34.0 pg   MCHC 33.9 30.0 - 36.0 g/dL   RDW 13.4 11.5 - 15.5 %   Platelets 232 150 - 400 K/uL   Neutrophils Relative % 81 %   Neutro Abs 7.6 1.7 - 7.7 K/uL   Lymphocytes Relative 9 %   Lymphs Abs 0.8 0.7 - 4.0 K/uL   Monocytes Relative 10 %   Monocytes Absolute 1.0 0.1 - 1.0 K/uL   Eosinophils Relative 0 %   Eosinophils Absolute 0.0 0.0 - 0.7 K/uL   Basophils Relative 0 %   Basophils Absolute 0.0 0.0 - 0.1 K/uL    Comment: Performed at [facility], [address], [place], [place] [address]  Basic metabolic panel     Status: Abnormal   Collection Time: 05/07/18 11:42 AM  Result Value Ref Range   Sodium 133 (L) 135 - 145 mmol/L   Potassium 3.7 3.5 - 5.1 mmol/L   Chloride 102 98 - 111 mmol/L   CO2 23 22 - 32 mmol/L   Glucose, Bld 140 (H) 70 - 99 mg/dL   BUN 16 8 - 23 mg/dL   Creatinine, Ser 0.83 0.61 - 1.24  mg/dL   Calcium 8.6 (L) 8.9 - 10.3 mg/dL   GFR calc non Af Amer >60 >60 mL/min   GFR calc Af Amer >60 >60 mL/min    Comment: (NOTE) The eGFR has been calculated using the CKD EPI equation. This calculation has not been validated in all clinical situations. eGFR's persistently <60 mL/min signify possible Chronic Kidney Disease.    Anion gap 8 5 - 15    Comment: Performed at [facility], [address], [place], [place] [address]  Culture, blood (routine x 2)     Status: None (Preliminary result)   Collection Time: 05/07/18 11:42 AM  Result Value Ref Range   Specimen Description RIGHT ANTECUBITAL    Special Requests      BOTTLES DRAWN AEROBIC AND ANAEROBIC Blood Culture adequate volume   Culture      NO GROWTH < 24 HOURS Performed at [facility], [address], [place], [place] [address]    Report Status PENDING   Sedimentation rate     Status:  Abnormal   Collection Time: 05/07/18 11:42 AM  Result Value Ref Range   Sed Rate 58 (H) 0 - 16 mm/hr    Comment: Performed at [facility], [address], [place], [place] [address]  C-reactive protein     Status: Abnormal   Collection Time: 05/07/18 11:42 AM  Result Value Ref Range   CRP 18.5 (H) <1.0 mg/dL    Comment: Performed at [facility], [place] [address], [place], [place] [address]  Hemoglobin A1c     Status: Abnormal   Collection Time: 05/07/18 11:42 AM  Result Value Ref Range   Hgb A1c MFr Bld 6.9 (H) 4.8 - 5.6 %    Comment: (NOTE) Pre diabetes:          5.7%-6.4% Diabetes:              >6.4% Glycemic control for   <7.0% adults with diabetes    Mean Plasma Glucose 151.33 mg/dL    Comment: Performed at [facility] [address] [place], [place] [address]  Prealbumin     Status: Abnormal   Collection Time: 05/07/18 11:42 AM  Result Value Ref Range   Prealbumin 12.2 (L) 18 - 38 mg/dL    Comment: Performed at [facility] [address], [place], [place] [address]  Culture, blood (routine x 2)     Status: None (Preliminary result)   Collection Time: 05/07/18  1:11 PM  Result Value Ref Range   Specimen Description RIGHT ANTECUBITAL    Special Requests      BOTTLES DRAWN AEROBIC AND ANAEROBIC Blood Culture adequate volume   Culture      NO GROWTH < 24 HOURS Performed at [facility], [address], [place], [place] [address]    Report Status PENDING   Lactic acid, plasma     Status: None   Collection Time: 05/07/18  1:17 PM  Result Value Ref Range   Lactic Acid, Venous 0.9 0.5 - 1.9 mmol/L    Comment: Performed at [facility], [address], [place],  [address]  Glucose, capillary     Status: Abnormal   Collection Time: 05/07/18  4:46 PM  Result Value Ref Range   Glucose-Capillary 173 (H) 70 - 99 mg/dL   Comment 1 Notify RN    Comment 2 Document in Chart   Glucose, capillary     Status: Abnormal   Collection Time: 05/07/18 10:14 PM  Result Value Ref Range   Glucose-Capillary 135 (H) 70 - 99 mg/dL  Comprehensive metabolic panel     Status: Abnormal   Collection Time: 05/08/18  5:17 AM  Result Value Ref Range   Sodium 136 135 - 145 mmol/L   Potassium 3.8 3.5 - 5.1 mmol/L   Chloride 100 98 - 111 mmol/L   CO2 25 22 - 32 mmol/L   Glucose, Bld 178 (H) 70 - 99 mg/dL   BUN 20 8 - 23 mg/dL   Creatinine, Ser 0.98 0.61 - 1.24 mg/dL   Calcium 8.6 (L) 8.9 - 10.3 mg/dL   Total Protein 6.4 (L) 6.5 - 8.1 g/dL   Albumin 3.1 (L) 3.5 - 5.0 g/dL   AST 17 15 - 41 U/L   ALT 16 0 - 44 U/L   Alkaline Phosphatase 53 38 - 126 U/L   Total Bilirubin 0.8 0.3 - 1.2 mg/dL   GFR calc non Af Amer >60 >60 mL/min   GFR calc Af Amer >60 >60 mL/min    Comment: (NOTE) The eGFR has been calculated using the CKD EPI equation. This calculation has not been validated in all clinical situations. eGFR's persistently <60 mL/min signify possible Chronic Kidney Disease.    Anion gap 11 5 - 15    Comment: Performed at [facility], [address] [place], [place] [address]  Magnesium     Status: None   Collection Time: 05/08/18  5:17 AM  Result Value Ref Range   Magnesium 2.0 1.7 - 2.4 mg/dL    Comment: Performed at [facility], [address] [place], [place] [address]  CBC WITH DIFFERENTIAL     Status: Abnormal   Collection Time: 05/08/18  5:17 AM  Result Value Ref Range   WBC 7.7 4.0 - 10.5 K/uL   RBC 3.15 (L) 4.22 - 5.81 MIL/uL   Hemoglobin 10.4 (L) 13.0 - 17.0 g/dL   HCT 31.5 (L) 39.0 - 52.0 %   MCV 100.0 78.0 - 100.0 fL   MCH 33.0 26.0 - 34.0 pg   MCHC 33.0 30.0 - 36.0 g/dL   RDW 13.5 11.5 - 15.5 %   Platelets 246 150 - 400 K/uL   Neutrophils Relative % 80 %   Neutro Abs 6.1 1.7 - 7.7 K/uL   Lymphocytes Relative 12 %   Lymphs Abs 0.9 0.7 - 4.0 K/uL   Monocytes Relative 8 %   Monocytes Absolute 0.6 0.1 - 1.0 K/uL   Eosinophils Relative 0 %   Eosinophils Absolute 0.0 0.0 - 0.7 K/uL   Basophils Relative 0 %   Basophils Absolute 0.0  0.0 - 0.1 K/uL    Comment: Performed at [facility], [address] [place], [place] [address]  Glucose, capillary     Status: Abnormal   Collection Time: 05/08/18  7:50 AM  Result Value Ref Range   Glucose-Capillary 148 (H) 70 - 99 mg/dL    ABGS No results for input(s): PHART, PO2ART, TCO2, HCO3 in the last 72 hours.  Invalid input(s): PCO2 CULTURES Recent Results (from the past 240 hour(s))  Culture, blood (routine x 2)     Status: None (Preliminary result)   Collection Time: 05/07/18 11:42 AM  Result Value Ref Range Status   Specimen Description RIGHT ANTECUBITAL  Final   Special Requests   Final    BOTTLES DRAWN AEROBIC AND ANAEROBIC Blood Culture adequate volume   Culture   Final    NO GROWTH < 24 HOURS Performed at [facility], [address] [place], [place] [address]    Report Status PENDING  Incomplete  Culture, blood (routine x 2)     Status: None (Preliminary result)   Collection Time: 05/07/18  1:11 PM  Result Value Ref Range Status   Specimen Description RIGHT ANTECUBITAL  Final   Special Requests   Final    BOTTLES DRAWN AEROBIC AND ANAEROBIC Blood Culture adequate volume   Culture   Final    NO GROWTH < 24 HOURS Performed at [facility], [address], [place], [place] [address]    Report Status PENDING  Incomplete   Studies/Results: Dg Foot Complete Left  Result Date: 05/07/2018 CLINICAL DATA:  Right great toe and foot red and painful, pt stated it began 4 days ago, worsening today, prior would of left great toe EXAM: LEFT FOOT - COMPLETE 3+ VIEW COMPARISON:  11/16/2016 FINDINGS: Degenerative changes are mild-to-moderate, most significant at the first metatarsophalangeal joint. There is diffuse soft tissue swelling about the midfoot and forefoot with more focal moderate soft tissue swelling about the first digit. No soft tissue gas. Although there is no gross osseous destruction, there is localized osteopenia in the distal great toe, for which osteolysis  cannot be excluded. No radiopaque foreign object. IMPRESSION: Soft tissue swelling about the great toe and remainder of the forefoot, most consistent with cellulitis. Localized osteopenia about the distal first digit, for which osteolysis cannot be excluded. If osteomyelitis is a clinical concern, consider further evaluation with pre and postcontrast MRI. Electronically Signed   By: Abigail Miyamoto M.D.   On: 05/07/2018 12:44    Medications:  Prior to Admission:  Medications Prior to Admission  Medication Sig Dispense Refill Last Dose  . aspirin EC 81 MG EC tablet Take 1 tablet (81 mg total) by mouth daily. (Patient taking differently: Take 81 mg by mouth at bedtime. )   05/06/2018 at Unknown time  . b complex vitamins tablet Take 1 tablet by mouth daily.   05/06/2018 at Unknown time  . carvedilol (COREG) 3.125 MG tablet TAKE ONE TABLET BY MOUTH TWICE A DAY WITH A MEAL 180 tablet 3 05/06/2018 at 2100  . clopidogrel (PLAVIX) 75 MG tablet Take 75 mg by mouth daily.    05/06/2018 at Unknown time  . Cyanocobalamin ([employer] B-12) 1500 MCG TBDP Take 1 tablet by mouth daily.    05/06/2018 at Unknown time  . doxycycline (VIBRA-TABS) 100 MG tablet Take 1 tablet (100 mg total) by mouth 2 (two) times daily. 20 tablet 0 05/07/2018 at Unknown time  . DULoxetine (CYMBALTA) 60 MG capsule Take 60 mg by mouth 2 (two) times daily.   05/06/2018 at Unknown time  . furosemide (LASIX) 40 MG tablet Take 1 tablet (40 mg total) by mouth 2 (two) times daily. 180 tablet 3 05/06/2018 at Unknown time  . glimepiride (AMARYL) 4 MG tablet Take 4 mg by mouth daily with breakfast.   05/06/2018 at Unknown time  . levothyroxine (SYNTHROID, LEVOTHROID) 75 MCG tablet Take 75 mcg by mouth daily.   05/06/2018 at Unknown time  . lisinopril (PRINIVIL,ZESTRIL) 5 MG tablet TAKE ONE TABLET BY MOUTH TWICE A DAY 180 tablet 3 05/06/2018 at Unknown time  . midodrine (PROAMATINE) 5 MG tablet Take 5 mg by mouth daily as needed (WHEN bp LEVELS DROP).    Past Month at  Unknown time  . potassium chloride SA (K-DUR,KLOR-CON) 20 MEQ tablet Take 1 tablet (20 mEq total) by mouth daily. 90 tablet 3 05/06/2018 at Unknown time  .  pramipexole (MIRAPEX) 0.25 MG tablet Take 2 pills at 7 PM and 2 pills at 9 PM daily. 360 tablet 3 05/06/2018 at Unknown time  . pravastatin (PRAVACHOL) 40 MG tablet Take 40 mg by mouth every morning.    05/06/2018 at Unknown time  . rOPINIRole (REQUIP) 0.5 MG tablet Take 0.5 mg by mouth at bedtime.    05/06/2018 at Unknown time  . tamsulosin (FLOMAX) 0.4 MG CAPS capsule Take 1 capsule by mouth every morning.   05/06/2018 at Unknown time  . Thiamine Mononitrate (VITAMIN B1 PO) Take 1,000 mg by mouth 2 (two) times daily.   05/06/2018 at Unknown time  . nitroGLYCERIN (NITROSTAT) 0.4 MG SL tablet Place 1 tablet (0.4 mg total) under the tongue every 5 (five) minutes as needed for chest pain. 25 tablet 3 unknown   Scheduled: . aspirin EC  81 mg Oral QHS  . carvedilol  3.125 mg Oral BID WC  . clopidogrel  75 mg Oral Daily  . DULoxetine  60 mg Oral BID  . enoxaparin (LOVENOX) injection  40 mg Subcutaneous Q24H  . [START ON 05/09/2018] furosemide  40 mg Oral Daily  . insulin aspart  0-15 Units Subcutaneous TID WC  . insulin aspart  0-5 Units Subcutaneous QHS  . levothyroxine  75 mcg Oral QAC breakfast  . potassium chloride SA  20 mEq Oral Daily  . pramipexole  0.5 mg Oral 2 times per day  . pravastatin  40 mg Oral q1800  . rOPINIRole  0.5 mg Oral QHS  . saccharomyces boulardii  250 mg Oral BID  . sodium chloride flush  3 mL Intravenous Q12H  . tamsulosin  0.4 mg Oral q morning - 10a  . thiamine  1,000 mg Oral BID  . vitamin B-12  1,500 mcg Oral Daily   Continuous: . sodium chloride    . sodium chloride    . meropenem (MERREM) IV Stopped (05/08/18 8675)  . vancomycin Stopped (05/08/18 0537)   QGB:EEFEOF chloride, acetaminophen **OR** acetaminophen, nitroGLYCERIN, ondansetron **OR** ondansetron (ZOFRAN) IV, oxyCODONE, senna-docusate, sodium  chloride flush, traZODone  Assesment: He is felt to have cellulitis of the left foot but he may have osteomyelitis.  He is having drainage now so I have ordered culture.  He has cardiomyopathy which is stable  He has peripheral arterial disease which makes it harder for him to heal his foot.  He has hypertension which is well controlled.  He is actually somewhat hypotensive and I am going to reduce his medications  He has diabetes and is on sliding scale. Principal Problem:   Cellulitis of left foot Active Problems:   History of stroke June 2013   Hypertension   Type 2 diabetes mellitus with circulatory disorder (HCC)   Peripheral neuropathy   Cardiomyopathy- EF NL 01/2013, now 30-35% echo   Hyperlipidemia   Peripheral arterial disease (HCC)   Restless leg syndrome   Obstructive sleep apnea   Bilateral carotid artery disease ([place])    Plan: Hold lisinopril for now.  MRI of the foot today.  Culture his drainage.    LOS: 1 day   Marzelle Rutten L 05/08/2018, 8:53 AM

## 2018-05-08 NOTE — Progress Notes (Signed)
Initial Nutrition Assessment  DOCUMENTATION CODES:   Obesity unspecified  INTERVENTION:  Ensure Enlive po BID, each supplement provides 350 kcal and 20 grams of protein   Heart Healthy / CHO modified diet    NUTRITION DIAGNOSIS:   Increased nutrient needs related to acute illness, decreased appetite(cellulitits of left foot) as evidenced by estimated needs, per patient/family report.   GOAL:   Patient will meet greater than or equal to 90% of their needs   MONITOR:   PO intake, Supplement acceptance, Weight trends, Skin  REASON FOR ASSESSMENT:   Consult Wound healing  ASSESSMENT:  Patient is a 72 yo male with hx of HTN, DM, Stroke, PVD. He has hx non-healing wounds bilateral great toe. He presents with left foot pain, redness and edema.   Patient appetite is excellent- 100% of breakfast and lunch today. Patient says his appetite has been slightly diminished the past 4-5 days due to the pain in his foot. When he is well - usual meal pattern is three times daily. Denies chew or swallow problems. Able to feed himself. Ambulates independently.   He has been maintaining his weight between (103.2-106.8 kg) 227-235 lb . Good muscle tone and no evidence of malnutrition. His needs are increased due to inflammation.    Labs:  CRP- 18.5 (H) BMP Latest Ref Rng & Units 05/08/2018 05/07/2018 01/25/2017  Glucose 70 - 99 mg/dL 178(H) 140(H) 282(H)  BUN 8 - 23 mg/dL 20 16 17   Creatinine 0.61 - 1.24 mg/dL 0.98 0.83 1.17  Sodium 135 - 145 mmol/L 136 133(L) 134(L)  Potassium 3.5 - 5.1 mmol/L 3.8 3.7 3.9  Chloride 98 - 111 mmol/L 100 102 99(L)  CO2 22 - 32 mmol/L 25 23 26   Calcium 8.9 - 10.3 mg/dL 8.6(L) 8.6(L) 8.7(L)    Medications reviewed and include:  Lasix, potassium,  novolog, levothyroxine, B-12, thiamine and florastor  NUTRITION - FOCUSED PHYSICAL EXAM:   Most Recent Value  Orbital Region  No depletion  Upper Arm Region  No depletion  Thoracic and Lumbar Region  No depletion   Buccal Region  No depletion  Temple Region  No depletion  Clavicle Bone Region  No depletion  Clavicle and Acromion Bone Region  No depletion  Dorsal Hand  No depletion  Patellar Region  No depletion  Anterior Thigh Region  No depletion  Posterior Calf Region  No depletion  Edema (RD Assessment)  None  Hair  Unable to assess  Mouth  Reviewed  Skin  Reviewed      Diet Order:   Diet Order            Diet heart healthy/carb modified Room service appropriate? Yes; Fluid consistency: Thin  Diet effective now             EDUCATION NEEDS:  Education needs have been addressed    Skin:  Skin Assessment: Reviewed RN Assessment(cellutitis left foot- no open areas)  Last BM:  8/17  Height:   Ht Readings from Last 1 Encounters:  05/07/18 6' (1.829 m)    Weight:   Wt Readings from Last 1 Encounters:  05/07/18 103.2 kg    Ideal Body Weight:  81 kg  BMI:  Body mass index is 30.86 kg/m.  Estimated Nutritional Needs:   Kcal:  2163-2369  (21-23 kcal/kg/bw)  Protein:  121-138 gr (1.5-1.7 gr/kg/ibw)  Fluid:  >2000 ml daily   Colman Cater MS,RD,CSG,LDN Office: [phone] Pager: [phone]

## 2018-05-09 LAB — HIV ANTIBODY (ROUTINE TESTING W REFLEX): HIV Screen 4th Generation wRfx: NONREACTIVE

## 2018-05-09 LAB — GLUCOSE, CAPILLARY
Glucose-Capillary: 111 mg/dL — ABNORMAL HIGH (ref 70–99)
Glucose-Capillary: 194 mg/dL — ABNORMAL HIGH (ref 70–99)
Glucose-Capillary: 167 mg/dL — ABNORMAL HIGH (ref 70–99)
Glucose-Capillary: 173 mg/dL — ABNORMAL HIGH (ref 70–99)

## 2018-05-09 NOTE — Progress Notes (Signed)
Subjective: He says he feels better.  Less pain in his foot.  He is not having any fever.  His MRI did show osteomyelitis so is going to require prolonged IV antibiotic therapy  Objective: Vital signs in last 24 hours: Temp:  [97.4 F (36.3 C)-98.4 F (36.9 C)] 97.4 F (36.3 C) (08/20 0635) Pulse Rate:  [72-79] 77 (08/20 0635) Resp:  [18-20] 18 (08/20 0635) BP: (97-139)/(61-88) 129/63 (08/20 0635) SpO2:  [97 %-100 %] 100 % (08/20 0635) Weight change:  Last BM Date: 05/06/18  Intake/Output from previous day: 08/19 0701 - 08/20 0700 In: 2960 [P.O.:720; I.V.:1350; IV Piggyback:890] Out: 500 [Urine:500]  PHYSICAL EXAM General appearance: alert, cooperative and no distress Resp: clear to auscultation bilaterally Cardio: regular rate and rhythm, S1, S2 normal, no murmur, click, rub or gallop GI: soft, non-tender; bowel sounds normal; no masses,  no organomegaly Extremities: His foot is much less erythematous.  He is not having as much drainage from his toe  Lab Results:  Results for orders placed or performed during the hospital encounter of 05/07/18 (from the past 48 hour(s))  Lactic acid, plasma     Status: None   Collection Time: 05/07/18 11:42 AM  Result Value Ref Range   Lactic Acid, Venous 0.9 0.5 - 1.9 mmol/[name]    Comment: Performed at [facility], [address] [place], [place] [address]  CBC with Differential     Status: Abnormal   Collection Time: 05/07/18 11:42 AM  Result Value Ref Range   WBC 9.4 4.0 - 10.5 K/uL   RBC 3.39 ([name]) 4.22 - 5.81 MIL/uL   Hemoglobin 11.4 ([name]) 13.0 - 17.0 g/dL   HCT 33.6 ([name]) 39.0 - 52.0 %   MCV 99.1 78.0 - 100.0 fL   MCH 33.6 26.0 - 34.0 pg   MCHC 33.9 30.0 - 36.0 g/dL   RDW 13.4 11.5 - 15.5 %   Platelets 232 150 - 400 K/uL   Neutrophils Relative % 81 %   Neutro Abs 7.6 1.7 - 7.7 K/uL   Lymphocytes Relative 9 %   Lymphs Abs 0.8 0.7 - 4.0 K/uL   Monocytes Relative 10 %   Monocytes Absolute 1.0 0.1 - 1.0 K/uL   Eosinophils Relative 0 %    Eosinophils Absolute 0.0 0.0 - 0.7 K/uL   Basophils Relative 0 %   Basophils Absolute 0.0 0.0 - 0.1 K/uL    Comment: Performed at [facility], [address] [place], [place] [address]  Basic metabolic panel     Status: Abnormal   Collection Time: 05/07/18 11:42 AM  Result Value Ref Range   Sodium 133 ([name]) 135 - 145 mmol/[name]   Potassium 3.7 3.5 - 5.1 mmol/[name]   Chloride 102 98 - 111 mmol/[name]   CO2 23 22 - 32 mmol/[name]   Glucose, Bld 140 (H) 70 - 99 mg/dL   BUN 16 8 - 23 mg/dL   Creatinine, Ser 0.83 0.61 - 1.24 mg/dL   Calcium 8.6 ([name]) 8.9 - 10.3 mg/dL   GFR calc non Af Amer >60 >60 mL/min   GFR calc Af Amer >60 >60 mL/min    Comment: (NOTE) The eGFR has been calculated using the CKD EPI equation. This calculation has not been validated in all clinical situations. eGFR's persistently <60 mL/min signify possible Chronic Kidney Disease.    Anion gap 8 5 - 15    Comment: Performed at [facility], [address] [place], [place] [address]  Culture, blood (routine x 2)  Status: None (Preliminary result)   Collection Time: 05/07/18 11:42 AM  Result Value Ref Range   Specimen Description RIGHT ANTECUBITAL    Special Requests      BOTTLES DRAWN AEROBIC AND ANAEROBIC Blood Culture adequate volume   Culture      NO GROWTH 2 DAYS Performed at [facility], [address] [place], [place] [address]    Report Status PENDING   HIV antibody     Status: None   Collection Time: 05/07/18 11:42 AM  Result Value Ref Range   HIV Screen 4th Generation wRfx Non Reactive Non Reactive    Comment: (NOTE) Performed At: [facility] [place], [place] [id number] Rush Farmer MD RP:[id number]   Sedimentation rate     Status: Abnormal   Collection Time: 05/07/18 11:42 AM  Result Value Ref Range   Sed Rate 58 (H) 0 - 16 mm/hr    Comment: Performed at [facility], [address] [place], [place] [address]  C-reactive protein     Status: Abnormal   Collection Time: 05/07/18  11:42 AM  Result Value Ref Range   CRP 18.5 (H) <1.0 mg/dL    Comment: Performed at [facility] [address], [place], [place] [address]  Hemoglobin A1c     Status: Abnormal   Collection Time: 05/07/18 11:42 AM  Result Value Ref Range   Hgb A1c MFr Bld 6.9 (H) 4.8 - 5.6 %    Comment: (NOTE) Pre diabetes:          5.7%-6.4% Diabetes:              >6.4% Glycemic control for   <7.0% adults with diabetes    Mean Plasma Glucose 151.33 mg/dL    Comment: Performed at [facility] [address] [place], [place] [address]  Prealbumin     Status: Abnormal   Collection Time: 05/07/18 11:42 AM  Result Value Ref Range   Prealbumin 12.2 ([name]) 18 - 38 mg/dL    Comment: Performed at [facility] [address], [place], [place] [address]  Culture, blood (routine x 2)     Status: None (Preliminary result)   Collection Time: 05/07/18  1:11 PM  Result Value Ref Range   Specimen Description RIGHT ANTECUBITAL    Special Requests      BOTTLES DRAWN AEROBIC AND ANAEROBIC Blood Culture adequate volume   Culture      NO GROWTH 2 DAYS Performed at [facility], [address] [place], [place] [address]    Report Status PENDING   Lactic acid, plasma     Status: None   Collection Time: 05/07/18  1:17 PM  Result Value Ref Range   Lactic Acid, Venous 0.9 0.5 - 1.9 mmol/[name]    Comment: Performed at [facility], [address], [place], [place] [address]  Glucose, capillary     Status: Abnormal   Collection Time: 05/07/18  4:46 PM  Result Value Ref Range   Glucose-Capillary 173 (H) 70 - 99 mg/dL   Comment 1 Notify RN    Comment 2 Document in Chart   Glucose, capillary     Status: Abnormal   Collection Time: 05/07/18 10:14 PM  Result Value Ref Range   Glucose-Capillary 135 (H) 70 - 99 mg/dL  Comprehensive metabolic panel     Status: Abnormal   Collection Time: 05/08/18  5:17 AM  Result Value Ref Range   Sodium 136 135 - 145 mmol/[name]  Potassium 3.8 3.5 - 5.1 mmol/[name]   Chloride  100 98 - 111 mmol/[name]   CO2 25 22 - 32 mmol/[name]   Glucose, Bld 178 (H) 70 - 99 mg/dL   BUN 20 8 - 23 mg/dL   Creatinine, Ser 0.98 0.61 - 1.24 mg/dL   Calcium 8.6 ([name]) 8.9 - 10.3 mg/dL   Total Protein 6.4 ([name]) 6.5 - 8.1 g/dL   Albumin 3.1 ([name]) 3.5 - 5.0 g/dL   AST 17 15 - 41 U/[name]   ALT 16 0 - 44 U/[name]   Alkaline Phosphatase 53 38 - 126 U/[name]   Total Bilirubin 0.8 0.3 - 1.2 mg/dL   GFR calc non Af Amer >60 >60 mL/min   GFR calc Af Amer >60 >60 mL/min    Comment: (NOTE) The eGFR has been calculated using the CKD EPI equation. This calculation has not been validated in all clinical situations. eGFR's persistently <60 mL/min signify possible Chronic Kidney Disease.    Anion gap 11 5 - 15    Comment: Performed at [facility], [address], [place], [place] [address]  Magnesium     Status: None   Collection Time: 05/08/18  5:17 AM  Result Value Ref Range   Magnesium 2.0 1.7 - 2.4 mg/dL    Comment: Performed at [facility], [address] [place], [place] [address]  CBC WITH DIFFERENTIAL     Status: Abnormal   Collection Time: 05/08/18  5:17 AM  Result Value Ref Range   WBC 7.7 4.0 - 10.5 K/uL   RBC 3.15 ([name]) 4.22 - 5.81 MIL/uL   Hemoglobin 10.4 ([name]) 13.0 - 17.0 g/dL   HCT 31.5 ([name]) 39.0 - 52.0 %   MCV 100.0 78.0 - 100.0 fL   MCH 33.0 26.0 - 34.0 pg   MCHC 33.0 30.0 - 36.0 g/dL   RDW 13.5 11.5 - 15.5 %   Platelets 246 150 - 400 K/uL   Neutrophils Relative % 80 %   Neutro Abs 6.1 1.7 - 7.7 K/uL   Lymphocytes Relative 12 %   Lymphs Abs 0.9 0.7 - 4.0 K/uL   Monocytes Relative 8 %   Monocytes Absolute 0.6 0.1 - 1.0 K/uL   Eosinophils Relative 0 %   Eosinophils Absolute 0.0 0.0 - 0.7 K/uL   Basophils Relative 0 %   Basophils Absolute 0.0 0.0 - 0.1 K/uL    Comment: Performed at [facility], [address], [place], [place] [address]  Glucose, capillary     Status: Abnormal   Collection Time: 05/08/18  7:50 AM  Result Value Ref Range   Glucose-Capillary 148 (H) 70 - 99 mg/dL  Glucose,  capillary     Status: Abnormal   Collection Time: 05/08/18 11:43 AM  Result Value Ref Range   Glucose-Capillary 144 (H) 70 - 99 mg/dL  Aerobic Culture (superficial specimen)     Status: None (Preliminary result)   Collection Time: 05/08/18  1:27 PM  Result Value Ref Range   Specimen Description      TOE LEFT Performed at [facility], [address], [place], [place] [address]    Special Requests      Normal Performed at [facility], [address], [place], [place] [address]    Gram Stain      NO WBC SEEN RARE [place] Performed at [facility], [place] [address], [place], [place] [address]    Culture PENDING    Report Status PENDING   Glucose, capillary  Status: Abnormal   Collection Time: 05/08/18  3:33 PM  Result Value Ref Range   Glucose-Capillary 103 (H) 70 - 99 mg/dL  Glucose, capillary     Status: Abnormal   Collection Time: 05/08/18 10:50 PM  Result Value Ref Range   Glucose-Capillary 214 (H) 70 - 99 mg/dL   Comment 1 Notify RN    Comment 2 Document in Chart   Glucose, capillary     Status: Abnormal   Collection Time: 05/09/18  7:58 AM  Result Value Ref Range   Glucose-Capillary 111 (H) 70 - 99 mg/dL    ABGS No results for input(s): PHART, PO2ART, TCO2, HCO3 in the last 72 hours.  Invalid input(s): PCO2 CULTURES Recent Results (from the past 240 hour(s))  Culture, blood (routine x 2)     Status: None (Preliminary result)   Collection Time: 05/07/18 11:42 AM  Result Value Ref Range Status   Specimen Description RIGHT ANTECUBITAL  Final   Special Requests   Final    BOTTLES DRAWN AEROBIC AND ANAEROBIC Blood Culture adequate volume   Culture   Final    NO GROWTH 2 DAYS Performed at [facility], [address], [place], [place] [address]    Report Status PENDING  Incomplete  Culture, blood (routine x 2)     Status: None (Preliminary result)   Collection Time: 05/07/18  1:11 PM  Result Value Ref Range Status   Specimen Description RIGHT  ANTECUBITAL  Final   Special Requests   Final    BOTTLES DRAWN AEROBIC AND ANAEROBIC Blood Culture adequate volume   Culture   Final    NO GROWTH 2 DAYS Performed at [facility], [address] [place], [place] [address]    Report Status PENDING  Incomplete  Aerobic Culture (superficial specimen)     Status: None (Preliminary result)   Collection Time: 05/08/18  1:27 PM  Result Value Ref Range Status   Specimen Description   Final    TOE LEFT Performed at [facility], [address], [place], [place] [address]    Special Requests   Final    Normal Performed at [facility], [address] [place], [place] [address]    Gram Stain   Final    NO WBC SEEN RARE GRAM POSITIVE COCCI Performed at [facility], [place] [address] [place], [place] [address]    Culture PENDING  Incomplete   Report Status PENDING  Incomplete   Studies/Results: Mri Left Foot With And Without Contrast  Result Date: 05/08/2018 CLINICAL DATA:  Diabetic patient who suffered a laceration of the left great toe 05/03/2018 with onset redness, pain and swelling. EXAM: MRI OF THE LEFT FOREFOOT WITHOUT AND WITH CONTRAST TECHNIQUE: Multiplanar, multisequence MR imaging of the left forefoot was performed both before and after administration of intravenous contrast. CONTRAST:  20 mL MULTIHANCE GADOBENATE DIMEGLUMINE 529 MG/ML IV SOLN COMPARISON:  Plain films left foot 05/07/2018 and 11/16/2016. FINDINGS: Patient motion degrades the examination despite multiple repeated attempts at imaging. Bones/Joint/Cartilage There is marrow edema and enhancement throughout almost the entire distal phalanx of the great toe consistent with osteomyelitis. Only the most proximal aspect of the distal phalanx is spared. No other evidence of osteomyelitis is identified. No acute bony abnormality is seen. Moderate appearing first MTP osteoarthritis is noted. Ligaments Intact. Muscles and Tendons Appear intact.  No intramuscular fluid collection  is identified. Soft tissues No soft tissue abscess or joint effusion is seen. There is soft tissue edema and  enhancement about the great toe. IMPRESSION: Motion degraded examination demonstrates findings consistent with osteomyelitis throughout almost the entire distal phalanx of the great toe. No abscess or evidence of septic joint. Electronically Signed   By: Inge Rise M.D.   On: 05/08/2018 11:16   US Arterial Abi (screening Lower Extremity)  Result Date: 05/08/2018 CLINICAL DATA:  72 year old male with left foot pain, redness and swelling for the past 5 days. Prior history of PAD. EXAM: NONINVASIVE PHYSIOLOGIC VASCULAR STUDY OF BILATERAL LOWER EXTREMITIES TECHNIQUE: Evaluation of both lower extremities were performed at rest, including calculation of ankle-brachial indices with single level Doppler, pressure and pulse volume recording. COMPARISON:  None. FINDINGS: Right ABI:  0.98 Left ABI:  0.98 Right Lower Extremity:  Normal arterial waveforms at the ankle. Left Lower Extremity: Abnormal dorsalis pedis waveform. Cannot exclude small vessel disease. 0.9-0.99 Borderline PAD IMPRESSION: Minimally abnormal bilateral resting ankle-brachial indices consistent with borderline peripheral arterial disease. Abnormal left dorsalis pedis waveform. Cannot exclude small vessel disease. Electronically Signed   By: Jacqulynn Cadet M.D.   On: 05/08/2018 15:51   Dg Foot Complete Left  Result Date: 05/07/2018 CLINICAL DATA:  Right great toe and foot red and painful, pt stated it began 4 days ago, worsening today, prior would of left great toe EXAM: LEFT FOOT - COMPLETE 3+ VIEW COMPARISON:  11/16/2016 FINDINGS: Degenerative changes are mild-to-moderate, most significant at the first metatarsophalangeal joint. There is diffuse soft tissue swelling about the midfoot and forefoot with more focal moderate soft tissue swelling about the first digit. No soft tissue gas. Although there is no gross osseous destruction,  there is localized osteopenia in the distal great toe, for which osteolysis cannot be excluded. No radiopaque foreign object. IMPRESSION: Soft tissue swelling about the great toe and remainder of the forefoot, most consistent with cellulitis. Localized osteopenia about the distal first digit, for which osteolysis cannot be excluded. If osteomyelitis is a clinical concern, consider further evaluation with pre and postcontrast MRI. Electronically Signed   By: Abigail Miyamoto M.D.   On: 05/07/2018 12:44    Medications:  Prior to Admission:  Medications Prior to Admission  Medication Sig Dispense Refill Last Dose  . aspirin EC 81 MG EC tablet Take 1 tablet (81 mg total) by mouth daily. (Patient taking differently: Take 81 mg by mouth at bedtime. )   05/06/2018 at Unknown time  . b complex vitamins tablet Take 1 tablet by mouth daily.   05/06/2018 at Unknown time  . carvedilol (COREG) 3.125 MG tablet TAKE ONE TABLET BY MOUTH TWICE A DAY WITH A MEAL 180 tablet 3 05/06/2018 at 2100  . clopidogrel (PLAVIX) 75 MG tablet Take 75 mg by mouth daily.    05/06/2018 at Unknown time  . Cyanocobalamin ([employer] B-12) 1500 MCG TBDP Take 1 tablet by mouth daily.    05/06/2018 at Unknown time  . doxycycline (VIBRA-TABS) 100 MG tablet Take 1 tablet (100 mg total) by mouth 2 (two) times daily. 20 tablet 0 05/07/2018 at Unknown time  . DULoxetine (CYMBALTA) 60 MG capsule Take 60 mg by mouth 2 (two) times daily.   05/06/2018 at Unknown time  . furosemide (LASIX) 40 MG tablet Take 1 tablet (40 mg total) by mouth 2 (two) times daily. 180 tablet 3 05/06/2018 at Unknown time  . glimepiride (AMARYL) 4 MG tablet Take 4 mg by mouth daily with breakfast.   05/06/2018 at Unknown time  . levothyroxine (SYNTHROID, LEVOTHROID) 75 MCG tablet Take 75 mcg by mouth daily.  05/06/2018 at Unknown time  . lisinopril (PRINIVIL,ZESTRIL) 5 MG tablet TAKE ONE TABLET BY MOUTH TWICE A DAY 180 tablet 3 05/06/2018 at Unknown time  . midodrine (PROAMATINE) 5 MG  tablet Take 5 mg by mouth daily as needed (WHEN bp LEVELS DROP).    Past Month at Unknown time  . potassium chloride SA (K-DUR,KLOR-CON) 20 MEQ tablet Take 1 tablet (20 mEq total) by mouth daily. 90 tablet 3 05/06/2018 at Unknown time  . pramipexole (MIRAPEX) 0.25 MG tablet Take 2 pills at 7 PM and 2 pills at 9 PM daily. 360 tablet 3 05/06/2018 at Unknown time  . pravastatin (PRAVACHOL) 40 MG tablet Take 40 mg by mouth every morning.    05/06/2018 at Unknown time  . rOPINIRole (REQUIP) 0.5 MG tablet Take 0.5 mg by mouth at bedtime.    05/06/2018 at Unknown time  . tamsulosin (FLOMAX) 0.4 MG CAPS capsule Take 1 capsule by mouth every morning.   05/06/2018 at Unknown time  . Thiamine Mononitrate (VITAMIN B1 PO) Take 1,000 mg by mouth 2 (two) times daily.   05/06/2018 at Unknown time  . nitroGLYCERIN (NITROSTAT) 0.4 MG SL tablet Place 1 tablet (0.4 mg total) under the tongue every 5 (five) minutes as needed for chest pain. 25 tablet 3 unknown   Scheduled: . aspirin EC  81 mg Oral QHS  . carvedilol  3.125 mg Oral BID WC  . clopidogrel  75 mg Oral Daily  . DULoxetine  60 mg Oral BID  . enoxaparin (LOVENOX) injection  40 mg Subcutaneous Q24H  . feeding supplement (ENSURE ENLIVE)  237 mL Oral BID BM  . furosemide  40 mg Oral Daily  . insulin aspart  0-15 Units Subcutaneous TID WC  . insulin aspart  0-5 Units Subcutaneous QHS  . levothyroxine  75 mcg Oral QAC breakfast  . potassium chloride SA  20 mEq Oral Daily  . pramipexole  0.5 mg Oral 2 times per day  . pravastatin  40 mg Oral q1800  . rOPINIRole  0.5 mg Oral QHS  . saccharomyces boulardii  250 mg Oral BID  . sodium chloride flush  10-40 mL Intracatheter Q12H  . sodium chloride flush  3 mL Intravenous Q12H  . tamsulosin  0.4 mg Oral q morning - 10a  . thiamine  1,000 mg Oral BID  . vitamin B-12  1,500 mcg Oral Daily   Continuous: . sodium chloride    . sodium chloride 75 mL/hr at 05/09/18 0846  . meropenem (MERREM) IV Stopped (05/09/18 0754)   . vancomycin Stopped (05/09/18 0405)   ZOX:WRUEAV chloride, acetaminophen **OR** acetaminophen, nitroGLYCERIN, ondansetron **OR** ondansetron (ZOFRAN) IV, oxyCODONE, senna-docusate, sodium chloride flush, sodium chloride flush, traZODone  Assesment: He is admitted with cellulitis of his foot but it turns out he has osteomyelitis.  He has PICC line now.  He has diabetes and is on sliding scale  He has peripheral arterial disease which may make him have more trouble healing.  He has restless leg syndrome on medications  He has neuropathy from his diabetes which is stable  He has sleep apnea on CPAP stable Principal Problem:   Cellulitis of left foot Active Problems:   History of stroke June 2013   Hypertension   Type 2 diabetes mellitus with circulatory disorder ([place])   Peripheral neuropathy   Cardiomyopathy- EF NL 01/2013, now 30-35% echo   Hyperlipidemia   Peripheral arterial disease (HCC)   Restless leg syndrome   Obstructive sleep apnea   Bilateral  carotid artery disease ([place])    Plan: I am awaiting the results of his culture of his drainage from his toe.  Once that is available we will discuss with infectious disease about what the appropriate antibiotic treatment will be for him    LOS: 2 days   [name] 05/09/2018, 10:14 AM

## 2018-05-09 NOTE — Care Management Important Message (Signed)
Important Message  Patient Details  Name: [name] MRN: [id number] Date of Birth: 01/31/1946   Medicare Important Message Given:  Yes    Shelda Altes 05/09/2018, 10:06 AM

## 2018-05-10 LAB — CBC
HCT: 31.2 % — ABNORMAL LOW (ref 39.0–52.0)
Hemoglobin: 10.6 g/dL — ABNORMAL LOW (ref 13.0–17.0)
MCH: 33.2 pg (ref 26.0–34.0)
MCHC: 34 g/dL (ref 30.0–36.0)
MCV: 97.8 fL (ref 78.0–100.0)
Platelets: 294 10*3/uL (ref 150–400)
RBC: 3.19 MIL/uL — ABNORMAL LOW (ref 4.22–5.81)
RDW: 13.1 % (ref 11.5–15.5)
WBC: 5.4 10*3/uL (ref 4.0–10.5)

## 2018-05-10 LAB — BASIC METABOLIC PANEL
Anion gap: 8 (ref 5–15)
BUN: 12 mg/dL (ref 8–23)
CO2: 28 mmol/L (ref 22–32)
Calcium: 8.6 mg/dL — ABNORMAL LOW (ref 8.9–10.3)
Chloride: 98 mmol/L (ref 98–111)
Creatinine, Ser: 0.7 mg/dL (ref 0.61–1.24)
GFR calc Af Amer: >60 mL/min (ref >60)
GFR calc non Af Amer: >60 mL/min (ref >60)
Glucose, Bld: 179 mg/dL — ABNORMAL HIGH (ref 70–99)
Potassium: 3.8 mmol/L (ref 3.5–5.1)
Sodium: 134 mmol/L — ABNORMAL LOW (ref 135–145)

## 2018-05-10 LAB — VANCOMYCIN, TROUGH: Vancomycin Tr: 12 ug/mL — ABNORMAL LOW (ref 15–20)

## 2018-05-10 LAB — GLUCOSE, CAPILLARY
Glucose-Capillary: 146 mg/dL — ABNORMAL HIGH (ref 70–99)
Glucose-Capillary: 197 mg/dL — ABNORMAL HIGH (ref 70–99)
Glucose-Capillary: 125 mg/dL — ABNORMAL HIGH (ref 70–99)
Glucose-Capillary: 163 mg/dL — ABNORMAL HIGH (ref 70–99)

## 2018-05-10 MED ORDER — VITAMIN B-1 100 MG PO TABS
100.0000 mg | ORAL_TABLET | Freq: Two times a day (BID) | ORAL | Status: DC
  Administered 2018-05-10 – 2018-05-11 (×3): 100 mg via ORAL
  Filled 2018-05-10 (×3): qty 1

## 2018-05-10 NOTE — Progress Notes (Signed)
Subjective: He says he feels much better.  Less pain in his foot.  Culture from his foot drainage has been reintubated.  Objective: Vital signs in last 24 hours: Temp:  [97.7 F (36.5 C)-97.9 F (36.6 C)] 97.9 F (36.6 C) (08/21 0530) Pulse Rate:  [68-73] 68 (08/21 0530) Resp:  [18] 18 (08/21 0530) BP: (135-141)/(61-65) 136/61 (08/21 0530) SpO2:  [94 %-98 %] 97 % (08/21 0530) Weight change:  Last BM Date: 05/06/18  Intake/Output from previous day: 08/20 0701 - 08/21 0700 In: 3835.4 [P.O.:720; I.V.:1708.8; IV Piggyback:1406.7] Out: 1900 [Urine:1900]  PHYSICAL EXAM General appearance: alert, cooperative and no distress Resp: clear to auscultation bilaterally Cardio: regular rate and rhythm, S1, S2 normal, no murmur, click, rub or gallop GI: soft, non-tender; bowel sounds normal; no masses,  no organomegaly Extremities: Much less erythema  Lab Results:  Results for orders placed or performed during the hospital encounter of 05/07/18 (from the past 48 hour(s))  Glucose, capillary     Status: Abnormal   Collection Time: 05/08/18 11:43 AM  Result Value Ref Range   Glucose-Capillary 144 (H) 70 - 99 mg/dL  Aerobic Culture (superficial specimen)     Status: None (Preliminary result)   Collection Time: 05/08/18  1:27 PM  Result Value Ref Range   Specimen Description      TOE LEFT Performed at [facility], [address] [place], [place] [address]    Special Requests      Normal Performed at [facility], [address], [place], [place] [address]    Gram Stain NO WBC SEEN RARE GRAM POSITIVE COCCI     Culture      CULTURE REINCUBATED FOR BETTER GROWTH Performed at [facility], [place] [address], [place], [place] [address]    Report Status PENDING   Glucose, capillary     Status: Abnormal   Collection Time: 05/08/18  3:33 PM  Result Value Ref Range   Glucose-Capillary 103 (H) 70 - 99 mg/dL  Glucose, capillary     Status: Abnormal   Collection Time: 05/08/18 10:50  PM  Result Value Ref Range   Glucose-Capillary 214 (H) 70 - 99 mg/dL   Comment 1 Notify RN    Comment 2 Document in Chart   Glucose, capillary     Status: Abnormal   Collection Time: 05/09/18  7:58 AM  Result Value Ref Range   Glucose-Capillary 111 (H) 70 - 99 mg/dL  Glucose, capillary     Status: Abnormal   Collection Time: 05/09/18 11:17 AM  Result Value Ref Range   Glucose-Capillary 194 (H) 70 - 99 mg/dL  Glucose, capillary     Status: Abnormal   Collection Time: 05/09/18  4:18 PM  Result Value Ref Range   Glucose-Capillary 167 (H) 70 - 99 mg/dL  Glucose, capillary     Status: Abnormal   Collection Time: 05/09/18  9:52 PM  Result Value Ref Range   Glucose-Capillary 173 (H) 70 - 99 mg/dL  Vancomycin, trough     Status: Abnormal   Collection Time: 05/10/18  3:30 AM  Result Value Ref Range   Vancomycin Tr 12 (L) 15 - 20 ug/mL    Comment: Performed at [facility], [address], [place], [place] [address]  CBC     Status: Abnormal   Collection Time: 05/10/18  3:30 AM  Result Value Ref Range   WBC 5.4 4.0 - 10.5 K/uL   RBC 3.19 (L) 4.22 - 5.81 MIL/uL   Hemoglobin 10.6 (L)  13.0 - 17.0 g/dL   HCT 31.2 (L) 39.0 - 52.0 %   MCV 97.8 78.0 - 100.0 fL   MCH 33.2 26.0 - 34.0 pg   MCHC 34.0 30.0 - 36.0 g/dL   RDW 13.1 11.5 - 15.5 %   Platelets 294 150 - 400 K/uL    Comment: Performed at [facility], [address], [place], [place] [address]  Basic metabolic panel     Status: Abnormal   Collection Time: 05/10/18  3:30 AM  Result Value Ref Range   Sodium 134 (L) 135 - 145 mmol/L   Potassium 3.8 3.5 - 5.1 mmol/L   Chloride 98 98 - 111 mmol/L   CO2 28 22 - 32 mmol/L   Glucose, Bld 179 (H) 70 - 99 mg/dL   BUN 12 8 - 23 mg/dL   Creatinine, Ser 0.70 0.61 - 1.24 mg/dL   Calcium 8.6 (L) 8.9 - 10.3 mg/dL   GFR calc non Af Amer >60 >60 mL/min   GFR calc Af Amer >60 >60 mL/min    Comment: (NOTE) The eGFR has been calculated using the CKD EPI equation. This calculation has not been  validated in all clinical situations. eGFR's persistently <60 mL/min signify possible Chronic Kidney Disease.    Anion gap 8 5 - 15    Comment: Performed at [facility], [address] [place], [place] [address]  Glucose, capillary     Status: Abnormal   Collection Time: 05/10/18  7:31 AM  Result Value Ref Range   Glucose-Capillary 146 (H) 70 - 99 mg/dL    ABGS No results for input(s): PHART, PO2ART, TCO2, HCO3 in the last 72 hours.  Invalid input(s): PCO2 CULTURES Recent Results (from the past 240 hour(s))  Culture, blood (routine x 2)     Status: None (Preliminary result)   Collection Time: 05/07/18 11:42 AM  Result Value Ref Range Status   Specimen Description RIGHT ANTECUBITAL  Final   Special Requests   Final    BOTTLES DRAWN AEROBIC AND ANAEROBIC Blood Culture adequate volume   Culture   Final    NO GROWTH 3 DAYS Performed at [facility], [address] [place], [place] [address]    Report Status PENDING  Incomplete  Culture, blood (routine x 2)     Status: None (Preliminary result)   Collection Time: 05/07/18  1:11 PM  Result Value Ref Range Status   Specimen Description RIGHT ANTECUBITAL  Final   Special Requests   Final    BOTTLES DRAWN AEROBIC AND ANAEROBIC Blood Culture adequate volume   Culture   Final    NO GROWTH 3 DAYS Performed at [facility], [address], [place], [place] [address]    Report Status PENDING  Incomplete  Aerobic Culture (superficial specimen)     Status: None (Preliminary result)   Collection Time: 05/08/18  1:27 PM  Result Value Ref Range Status   Specimen Description   Final    TOE LEFT Performed at [facility], [address] [place], [place] [address]    Special Requests   Final    Normal Performed at [facility], [address], [place], [place] [address]    Gram Stain NO WBC SEEN RARE GRAM POSITIVE COCCI   Final   Culture   Final    CULTURE REINCUBATED FOR BETTER GROWTH Performed at [facility], [place]  [address], [place], [place] [address]    Report Status PENDING  Incomplete   Studies/Results: Mri Left  Foot With And Without Contrast  Result Date: 05/08/2018 CLINICAL DATA:  Diabetic patient who suffered a laceration of the left great toe 05/03/2018 with onset redness, pain and swelling. EXAM: MRI OF THE LEFT FOREFOOT WITHOUT AND WITH CONTRAST TECHNIQUE: Multiplanar, multisequence MR imaging of the left forefoot was performed both before and after administration of intravenous contrast. CONTRAST:  20 mL MULTIHANCE GADOBENATE DIMEGLUMINE 529 MG/ML IV SOLN COMPARISON:  Plain films left foot 05/07/2018 and 11/16/2016. FINDINGS: Patient motion degrades the examination despite multiple repeated attempts at imaging. Bones/Joint/Cartilage There is marrow edema and enhancement throughout almost the entire distal phalanx of the great toe consistent with osteomyelitis. Only the most proximal aspect of the distal phalanx is spared. No other evidence of osteomyelitis is identified. No acute bony abnormality is seen. Moderate appearing first MTP osteoarthritis is noted. Ligaments Intact. Muscles and Tendons Appear intact.  No intramuscular fluid collection is identified. Soft tissues No soft tissue abscess or joint effusion is seen. There is soft tissue edema and enhancement about the great toe. IMPRESSION: Motion degraded examination demonstrates findings consistent with osteomyelitis throughout almost the entire distal phalanx of the great toe. No abscess or evidence of septic joint. Electronically Signed   By: Inge Rise M.D.   On: 05/08/2018 11:16   US Arterial Abi (screening Lower Extremity)  Result Date: 05/08/2018 CLINICAL DATA:  72 year old male with left foot pain, redness and swelling for the past 5 days. Prior history of PAD. EXAM: NONINVASIVE PHYSIOLOGIC VASCULAR STUDY OF BILATERAL LOWER EXTREMITIES TECHNIQUE: Evaluation of both lower extremities were performed at rest, including calculation of  ankle-brachial indices with single level Doppler, pressure and pulse volume recording. COMPARISON:  None. FINDINGS: Right ABI:  0.98 Left ABI:  0.98 Right Lower Extremity:  Normal arterial waveforms at the ankle. Left Lower Extremity: Abnormal dorsalis pedis waveform. Cannot exclude small vessel disease. 0.9-0.99 Borderline PAD IMPRESSION: Minimally abnormal bilateral resting ankle-brachial indices consistent with borderline peripheral arterial disease. Abnormal left dorsalis pedis waveform. Cannot exclude small vessel disease. Electronically Signed   By: Jacqulynn Cadet M.D.   On: 05/08/2018 15:51    Medications:  Prior to Admission:  Medications Prior to Admission  Medication Sig Dispense Refill Last Dose  . aspirin EC 81 MG EC tablet Take 1 tablet (81 mg total) by mouth daily. (Patient taking differently: Take 81 mg by mouth at bedtime. )   05/06/2018 at Unknown time  . b complex vitamins tablet Take 1 tablet by mouth daily.   05/06/2018 at Unknown time  . carvedilol (COREG) 3.125 MG tablet TAKE ONE TABLET BY MOUTH TWICE A DAY WITH A MEAL 180 tablet 3 05/06/2018 at 2100  . clopidogrel (PLAVIX) 75 MG tablet Take 75 mg by mouth daily.    05/06/2018 at Unknown time  . Cyanocobalamin ([employer] B-12) 1500 MCG TBDP Take 1 tablet by mouth daily.    05/06/2018 at Unknown time  . doxycycline (VIBRA-TABS) 100 MG tablet Take 1 tablet (100 mg total) by mouth 2 (two) times daily. 20 tablet 0 05/07/2018 at Unknown time  . DULoxetine (CYMBALTA) 60 MG capsule Take 60 mg by mouth 2 (two) times daily.   05/06/2018 at Unknown time  . furosemide (LASIX) 40 MG tablet Take 1 tablet (40 mg total) by mouth 2 (two) times daily. 180 tablet 3 05/06/2018 at Unknown time  . glimepiride (AMARYL) 4 MG tablet Take 4 mg by mouth daily with breakfast.   05/06/2018 at Unknown time  . levothyroxine (SYNTHROID, LEVOTHROID) 75 MCG tablet Take  75 mcg by mouth daily.   05/06/2018 at Unknown time  . lisinopril (PRINIVIL,ZESTRIL) 5 MG tablet TAKE ONE  TABLET BY MOUTH TWICE A DAY 180 tablet 3 05/06/2018 at Unknown time  . midodrine (PROAMATINE) 5 MG tablet Take 5 mg by mouth daily as needed (WHEN bp LEVELS DROP).    Past Month at Unknown time  . potassium chloride SA (K-DUR,KLOR-CON) 20 MEQ tablet Take 1 tablet (20 mEq total) by mouth daily. 90 tablet 3 05/06/2018 at Unknown time  . pramipexole (MIRAPEX) 0.25 MG tablet Take 2 pills at 7 PM and 2 pills at 9 PM daily. 360 tablet 3 05/06/2018 at Unknown time  . pravastatin (PRAVACHOL) 40 MG tablet Take 40 mg by mouth every morning.    05/06/2018 at Unknown time  . rOPINIRole (REQUIP) 0.5 MG tablet Take 0.5 mg by mouth at bedtime.    05/06/2018 at Unknown time  . tamsulosin (FLOMAX) 0.4 MG CAPS capsule Take 1 capsule by mouth every morning.   05/06/2018 at Unknown time  . Thiamine Mononitrate (VITAMIN B1 PO) Take 1,000 mg by mouth 2 (two) times daily.   05/06/2018 at Unknown time  . nitroGLYCERIN (NITROSTAT) 0.4 MG SL tablet Place 1 tablet (0.4 mg total) under the tongue every 5 (five) minutes as needed for chest pain. 25 tablet 3 unknown   Scheduled: . aspirin EC  81 mg Oral QHS  . carvedilol  3.125 mg Oral BID WC  . clopidogrel  75 mg Oral Daily  . DULoxetine  60 mg Oral BID  . enoxaparin (LOVENOX) injection  40 mg Subcutaneous Q24H  . feeding supplement (ENSURE ENLIVE)  237 mL Oral BID BM  . furosemide  40 mg Oral Daily  . insulin aspart  0-15 Units Subcutaneous TID WC  . insulin aspart  0-5 Units Subcutaneous QHS  . levothyroxine  75 mcg Oral QAC breakfast  . potassium chloride SA  20 mEq Oral Daily  . pramipexole  0.5 mg Oral 2 times per day  . pravastatin  40 mg Oral q1800  . rOPINIRole  0.5 mg Oral QHS  . saccharomyces boulardii  250 mg Oral BID  . sodium chloride flush  10-40 mL Intracatheter Q12H  . sodium chloride flush  3 mL Intravenous Q12H  . tamsulosin  0.4 mg Oral q morning - 10a  . thiamine  1,000 mg Oral BID  . vitamin B-12  1,500 mcg Oral Daily   Continuous: . sodium  chloride    . sodium chloride 75 mL/hr at 05/09/18 2035  . meropenem (MERREM) IV Stopped (05/10/18 0640)  . vancomycin Stopped (05/10/18 0532)   NGE:XBMWUX chloride, acetaminophen **OR** acetaminophen, nitroGLYCERIN, ondansetron **OR** ondansetron (ZOFRAN) IV, oxyCODONE, senna-docusate, sodium chloride flush, sodium chloride flush, traZODone  Assesment: He was admitted with cellulitis of the left foot and he has osteomyelitis as well.  He has diabetes which is pretty well controlled.  He has peripheral arterial disease which likely is impacting his healing.  Overall he is better but I want to see what the culture shows before I send him home to try to narrow his antibiotics. Principal Problem:   Cellulitis of left foot Active Problems:   History of stroke June 2013   Hypertension   Type 2 diabetes mellitus with circulatory disorder ([place])   Peripheral neuropathy   Cardiomyopathy- EF NL 01/2013, now 30-35% echo   Hyperlipidemia   Peripheral arterial disease (HCC)   Restless leg syndrome   Obstructive sleep apnea   Bilateral carotid artery  disease [facility])    Plan: Continue current treatments.  Potentially home tomorrow    LOS: 3 days   Nikol Lemar L 05/10/2018, 8:54 AM

## 2018-05-10 NOTE — Progress Notes (Signed)
Patient declined CPAP use tonight. Hospital unit still at bedside.

## 2018-05-10 NOTE — Progress Notes (Signed)
  Pharmacy Antibiotic Note  [name] is a 72 y.o. male admitted on 05/07/2018 with cellulitis.  Pharmacy has been consulted for vancomycin and meropenem dosing.  Vanco trough 12 mcg/mL Continue meropenem 1g IV q8h Continue vancomycin 1g IV q12h . Goal trough 10-15 mcg/mL Monitor labs, c/s, and de-escalation of antibiotics   Height: 6' (182.9 cm) Weight: 227 lb 8.2 oz (103.2 kg) IBW/kg (Calculated) : 77.6  Temp (24hrs), Avg:97.8 F (36.6 C), Min:97.7 F (36.5 C), Max:97.9 F (36.6 C)  Recent Labs  Lab 05/07/18 1142 05/07/18 1317 05/08/18 0517 05/10/18 0330  WBC 9.4  --  7.7 5.4  CREATININE 0.83  --  0.98 0.70  LATICACIDVEN 0.9 0.9  --   --   VANCOTROUGH  --   --   --  12*    Estimated Creatinine Clearance: 100.6 mL/min (by C-G formula based on SCr of 0.7 mg/dL).    Allergies  Allergen Reactions  . Codeine Nausea And Vomiting  . Tramadol Nausea Only    Antimicrobials this admission: 8/18 clindamycin >> 8/18 8/18 meropenem >>  8/18 vancomycin >>  Microbiology results: 8/18 BCx2: ngtd 8/19 aerobic culture of toe: pending  Thank you for allowing pharmacy to be a part of this patient's care.  Margot Ables, PharmD Clinical Pharmacist 05/10/2018 11:22 AM

## 2018-05-10 NOTE — Care Management Note (Signed)
Case Management Note  Patient Details  Name: [name] MRN: [id number] Date of Birth: [birth year]  Subjective/Objective:       Pt admitted with osteo. From home, lives with wife, ind with ADL's. Has insurance and PCP.              Action/Plan: Will need 6 weeks IV abx. Pt has used [facility] in the past and would like to use them again. Pt and wife willing and able to administer IV abx at home. CM has given referral to [facility], [facility] rep. Do not have culture results back at this time. Anticipate DC home in next 24-48 hrs.   Expected Discharge Date:   05/11/2018               Expected Discharge Plan:  [place]  In-House Referral:  NA  Discharge planning Services  CM Consult  Post Acute Care Choice:  Home Health Choice offered to:  Patient  HH Arranged:  RN, IV Antibiotics HH Agency:  [place]  Status of Service:  Completed, signed off  If discussed at [place] of Stay Meetings, dates discussed:    Additional Comments:  Sherald Barge, RN 05/10/2018, 1:09 PM

## 2018-05-11 LAB — GLUCOSE, CAPILLARY
Glucose-Capillary: 154 mg/dL — ABNORMAL HIGH (ref 70–99)
Glucose-Capillary: 190 mg/dL — ABNORMAL HIGH (ref 70–99)

## 2018-05-11 MED ORDER — HEPARIN SOD (PORK) LOCK FLUSH 100 UNIT/ML IV SOLN
250.0000 [IU] | INTRAVENOUS | Status: AC | PRN
  Administered 2018-05-11: 250 [IU]
  Filled 2018-05-11: qty 5

## 2018-05-11 MED ORDER — HEPARIN SOD (PORK) LOCK FLUSH 100 UNIT/ML IV SOLN: 250.0000 [IU] | INTRAVENOUS | Status: DC | PRN

## 2018-05-11 MED ORDER — METRONIDAZOLE 500 MG PO TABS: 500.0000 mg | ORAL_TABLET | Freq: Three times a day (TID) | ORAL | Status: AC

## 2018-05-11 MED ORDER — SODIUM CHLORIDE 0.9 % IV SOLN
2.0000 g | INTRAVENOUS | Status: DC
  Administered 2018-05-11: 2 g via INTRAVENOUS
  Filled 2018-05-11 (×2): qty 20

## 2018-05-11 MED ORDER — METRONIDAZOLE 500 MG PO TABS
500.0000 mg | ORAL_TABLET | Freq: Three times a day (TID) | ORAL | Status: DC
  Administered 2018-05-11: 500 mg via ORAL
  Filled 2018-05-11: qty 1

## 2018-05-11 NOTE — Progress Notes (Signed)
Patient given discharge instructions at bedside, PICC to stay in for home health antibiotic treatment. PICC flushed with 250 ml of heparin.

## 2018-05-11 NOTE — Care Management (Signed)
DC home today. Vaughan Basta, [facility] rep, will pull HH and IV abx orders from chart. First dose will be given at home tomorrow. No further CM needs noted at thist ime.

## 2018-05-11 NOTE — Discharge Summary (Signed)
Physician Discharge Summary  Patient ID: [name] MRN: [id number] DOB/AGE: [birth year] 72 y.o. Primary Care Physician:Haunani Dickard, Percell Miller, MD Admit date: 05/07/2018 Discharge date: 05/11/2018    Discharge Diagnoses:  Osteomyelitis left foot Principal Problem:   Cellulitis of left foot Active Problems:   History of stroke June 2013   Hypertension   Type 2 diabetes mellitus with circulatory disorder (HCC)   Peripheral neuropathy   Cardiomyopathy- EF NL 01/2013, now 30-35% echo   Hyperlipidemia   Peripheral arterial disease (HCC)   Restless leg syndrome   Obstructive sleep apnea   Bilateral carotid artery disease (HCC)   Allergies as of 05/11/2018      Reactions   Codeine Nausea And Vomiting   Tramadol Nausea Only      Medication List    STOP taking these medications   doxycycline 100 MG tablet Commonly known as:  VIBRA-TABS     TAKE these medications   aspirin 81 MG EC tablet Take 1 tablet (81 mg total) by mouth daily. What changed:  when to take this   b complex vitamins tablet Take 1 tablet by mouth daily.   carvedilol 3.125 MG tablet Commonly known as:  COREG TAKE ONE TABLET BY MOUTH TWICE A DAY WITH A MEAL   clopidogrel 75 MG tablet Commonly known as:  PLAVIX Take 75 mg by mouth daily.   CVS B-12 1500 MCG Tbdp Generic drug:  Cyanocobalamin Take 1 tablet by mouth daily.   DULoxetine 60 MG capsule Commonly known as:  CYMBALTA Take 60 mg by mouth 2 (two) times daily.   furosemide 40 MG tablet Commonly known as:  LASIX Take 1 tablet (40 mg total) by mouth 2 (two) times daily.   glimepiride 4 MG tablet Commonly known as:  AMARYL Take 4 mg by mouth daily with breakfast.   levothyroxine 75 MCG tablet Commonly known as:  SYNTHROID, LEVOTHROID Take 75 mcg by mouth daily.   lisinopril 5 MG tablet Commonly known as:  PRINIVIL,ZESTRIL TAKE ONE TABLET BY MOUTH TWICE A DAY   metroNIDAZOLE 500 MG tablet Commonly known as:  FLAGYL Take 1 tablet (500 mg  total) by mouth every 8 (eight) hours.   midodrine 5 MG tablet Commonly known as:  PROAMATINE Take 5 mg by mouth daily as needed (WHEN bp LEVELS DROP).   nitroGLYCERIN 0.4 MG SL tablet Commonly known as:  NITROSTAT Place 1 tablet (0.4 mg total) under the tongue every 5 (five) minutes as needed for chest pain.   potassium chloride SA 20 MEQ tablet Commonly known as:  K-DUR,KLOR-CON Take 1 tablet (20 mEq total) by mouth daily.   pramipexole 0.25 MG tablet Commonly known as:  MIRAPEX Take 2 pills at 7 PM and 2 pills at 9 PM daily.   pravastatin 40 MG tablet Commonly known as:  PRAVACHOL Take 40 mg by mouth every morning.   rOPINIRole 0.5 MG tablet Commonly known as:  REQUIP Take 0.5 mg by mouth at bedtime.   tamsulosin 0.4 MG Caps capsule Commonly known as:  FLOMAX Take 1 capsule by mouth every morning.   VITAMIN B1 PO Take 1,000 mg by mouth 2 (two) times daily.       Discharged Condition: Improved    Consults: Telephone with infectious disease, Dr. Linus Salmons  Significant Diagnostic Studies: Mri Left Foot With And Without Contrast  Result Date: 05/08/2018 CLINICAL DATA:  Diabetic patient who suffered a laceration of the left great toe 05/03/2018 with onset redness, pain and swelling. EXAM: MRI OF THE  LEFT FOREFOOT WITHOUT AND WITH CONTRAST TECHNIQUE: Multiplanar, multisequence MR imaging of the left forefoot was performed both before and after administration of intravenous contrast. CONTRAST:  20 mL MULTIHANCE GADOBENATE DIMEGLUMINE 529 MG/ML IV SOLN COMPARISON:  Plain films left foot 05/07/2018 and 11/16/2016. FINDINGS: Patient motion degrades the examination despite multiple repeated attempts at imaging. Bones/Joint/Cartilage There is marrow edema and enhancement throughout almost the entire distal phalanx of the great toe consistent with osteomyelitis. Only the most proximal aspect of the distal phalanx is spared. No other evidence of osteomyelitis is identified. No acute  bony abnormality is seen. Moderate appearing first MTP osteoarthritis is noted. Ligaments Intact. Muscles and Tendons Appear intact.  No intramuscular fluid collection is identified. Soft tissues No soft tissue abscess or joint effusion is seen. There is soft tissue edema and enhancement about the great toe. IMPRESSION: Motion degraded examination demonstrates findings consistent with osteomyelitis throughout almost the entire distal phalanx of the great toe. No abscess or evidence of septic joint. Electronically Signed   By: Inge Rise M.D.   On: 05/08/2018 11:16   US Arterial Abi (screening Lower Extremity)  Result Date: 05/08/2018 CLINICAL DATA:  72 year old male with left foot pain, redness and swelling for the past 5 days. Prior history of PAD. EXAM: NONINVASIVE PHYSIOLOGIC VASCULAR STUDY OF BILATERAL LOWER EXTREMITIES TECHNIQUE: Evaluation of both lower extremities were performed at rest, including calculation of ankle-brachial indices with single level Doppler, pressure and pulse volume recording. COMPARISON:  None. FINDINGS: Right ABI:  0.98 Left ABI:  0.98 Right Lower Extremity:  Normal arterial waveforms at the ankle. Left Lower Extremity: Abnormal dorsalis pedis waveform. Cannot exclude small vessel disease. 0.9-0.99 Borderline PAD IMPRESSION: Minimally abnormal bilateral resting ankle-brachial indices consistent with borderline peripheral arterial disease. Abnormal left dorsalis pedis waveform. Cannot exclude small vessel disease. Electronically Signed   By: Jacqulynn Cadet M.D.   On: 05/08/2018 15:51   Dg Foot Complete Left  Result Date: 05/07/2018 CLINICAL DATA:  Right great toe and foot red and painful, pt stated it began 4 days ago, worsening today, prior would of left great toe EXAM: LEFT FOOT - COMPLETE 3+ VIEW COMPARISON:  11/16/2016 FINDINGS: Degenerative changes are mild-to-moderate, most significant at the first metatarsophalangeal joint. There is diffuse soft tissue swelling  about the midfoot and forefoot with more focal moderate soft tissue swelling about the first digit. No soft tissue gas. Although there is no gross osseous destruction, there is localized osteopenia in the distal great toe, for which osteolysis cannot be excluded. No radiopaque foreign object. IMPRESSION: Soft tissue swelling about the great toe and remainder of the forefoot, most consistent with cellulitis. Localized osteopenia about the distal first digit, for which osteolysis cannot be excluded. If osteomyelitis is a clinical concern, consider further evaluation with pre and postcontrast MRI. Electronically Signed   By: Abigail Miyamoto M.D.   On: 05/07/2018 12:44    Lab Results: Basic Metabolic Panel: Recent Labs    05/10/18 0330  NA 134*  K 3.8  CL 98  CO2 28  GLUCOSE 179*  BUN 12  CREATININE 0.70  CALCIUM 8.6*   Liver Function Tests: No results for input(s): AST, ALT, ALKPHOS, BILITOT, PROT, ALBUMIN in the last 72 hours.   CBC: Recent Labs    05/10/18 0330  WBC 5.4  HGB 10.6*  HCT 31.2*  MCV 97.8  PLT 294    Recent Results (from the past 240 hour(s))  Culture, blood (routine x 2)     Status: None (  Preliminary result)   Collection Time: 05/07/18 11:42 AM  Result Value Ref Range Status   Specimen Description RIGHT ANTECUBITAL  Final   Special Requests   Final    BOTTLES DRAWN AEROBIC AND ANAEROBIC Blood Culture adequate volume   Culture   Final    NO GROWTH 4 DAYS Performed at [facility], [address] [place], [place] [address]    Report Status PENDING  Incomplete  Culture, blood (routine x 2)     Status: None (Preliminary result)   Collection Time: 05/07/18  1:11 PM  Result Value Ref Range Status   Specimen Description RIGHT ANTECUBITAL  Final   Special Requests   Final    BOTTLES DRAWN AEROBIC AND ANAEROBIC Blood Culture adequate volume   Culture   Final    NO GROWTH 4 DAYS Performed at [facility], [address], [place], [place] [address]    Report Status  PENDING  Incomplete  Aerobic Culture (superficial specimen)     Status: None (Preliminary result)   Collection Time: 05/08/18  1:27 PM  Result Value Ref Range Status   Specimen Description   Final    TOE LEFT Performed at [facility], [address], [place], [place] [address]    Special Requests   Final    Normal Performed at [facility], [address] [place], [place] [address]    Gram Stain NO WBC SEEN RARE [place]   Final   Culture   Final    FEW PROTEUS MIRABILIS SUSCEPTIBILITIES TO FOLLOW Performed at [facility], [place] [address] [place],  [address]    Report Status PENDING  Incomplete     Hospital Course: This is a 72 year old who came to the emergency department because of increasing pain and erythema of his left foot.  He is known to have an ulcerated area on his left foot.  He was started on doxycycline by his podiatrist but continued to have trouble.  He is had some drainage now.  He was found to have approximately half of his left foot involved with cellulitis and he underwent MRI because of the concerns about osteomyelitis and his MRI was positive for that.  He grew Proteus from the drainage from his foot but it is unclear if that is the organism that is in his osteomyelitis.  He was started on meropenem and improved.  Discussed with infectious disease and they suggested ceftriaxone for 42 days at home with Flagyl 500 mg 3 times daily for 42 days at home.  He is going to be discharged home with home health to accomplish that.  He had PICC line placed  Discharge Exam: Blood pressure 109/64, pulse 74, temperature 97.7 F (36.5 C), temperature source Oral, resp. rate 18, height 6' (1.829 m), weight 103.2 kg, SpO2 97 %. He is awake and alert.  His foot looks much better.  Disposition: Home with home health services  Discharge Instructions    Face-to-face encounter (required for Medicare/Medicaid patients)   Complete by:  As directed    I  Abbey Veith L certify that this patient is under my care and that I, or a nurse practitioner or physician's assistant working with me, had a face-to-face encounter that meets the physician face-to-face encounter requirements with this patient on 05/11/2018. The encounter with the patient was in whole, or in part for the following medical condition(s) which is the primary reason for home health care (List medical condition): Osteomyelitis left foot   The  encounter with the patient was in whole, or in part, for the following medical condition, which is the primary reason for home health care:  Osteomyelitis left foot   I certify that, based on my findings, the following services are medically necessary home health services:   Nursing Physical therapy     Reason for Medically Necessary Home Health Services:  Skilled Nursing- Change/Decline in Patient Status   My clinical findings support the need for the above services:  Unable to leave home safely without assistance and/or assistive device   Further, I certify that my clinical findings support that this patient is homebound due to:  Unable to leave home safely without assistance   Home Health   Complete by:  As directed    To provide the following care/treatments:   PT RN     He will need ceftriaxone 2 g IV every 24 hours x42 days.  This will start on 05/12/2018.  Last dose October 3.  He should have PICC line care laboratory work per agency protocol        Signed: Karlina Suares L   05/11/2018, 9:42 AM

## 2018-05-11 NOTE — Progress Notes (Signed)
Subjective: He says he feels much better.  His foot looks much better.  He has no other new complaints.  He is breathing okay.  No chest pain.  Objective: Vital signs in last 24 hours: Temp:  [97.7 F (36.5 C)-98.2 F (36.8 C)] 97.7 F (36.5 C) (08/21 2029) Pulse Rate:  [71-74] 74 (08/21 2029) Resp:  [18] 18 (08/21 2029) BP: (109-128)/(64-75) 109/64 (08/21 2029) SpO2:  [97 %-98 %] 97 % (08/21 2029) Weight change:  Last BM Date: 05/06/18  Intake/Output from previous day: 08/21 0701 - 08/22 0700 In: 3391.7 [P.O.:960; I.V.:1685; IV Piggyback:746.7] Out: 5100 [Urine:5100]  PHYSICAL EXAM General appearance: alert, cooperative and no distress Resp: clear to auscultation bilaterally Cardio: regular rate and rhythm, S1, S2 normal, no murmur, click, rub or gallop GI: soft, non-tender; bowel sounds normal; no masses,  no organomegaly Extremities: His foot looks much better.  Toe wound is better.  Lab Results:  Results for orders placed or performed during the hospital encounter of 05/07/18 (from the past 48 hour(s))  Glucose, capillary     Status: Abnormal   Collection Time: 05/09/18 11:17 AM  Result Value Ref Range   Glucose-Capillary 194 (H) 70 - 99 mg/dL  Glucose, capillary     Status: Abnormal   Collection Time: 05/09/18  4:18 PM  Result Value Ref Range   Glucose-Capillary 167 (H) 70 - 99 mg/dL  Glucose, capillary     Status: Abnormal   Collection Time: 05/09/18  9:52 PM  Result Value Ref Range   Glucose-Capillary 173 (H) 70 - 99 mg/dL  Vancomycin, trough     Status: Abnormal   Collection Time: 05/10/18  3:30 AM  Result Value Ref Range   Vancomycin Tr 12 (L) 15 - 20 ug/mL    Comment: Performed at [facility], [address], [place], [place] [address]  CBC     Status: Abnormal   Collection Time: 05/10/18  3:30 AM  Result Value Ref Range   WBC 5.4 4.0 - 10.5 K/uL   RBC 3.19 (L) 4.22 - 5.81 MIL/uL   Hemoglobin 10.6 (L) 13.0 - 17.0 g/dL   HCT 31.2 (L) 39.0 - 52.0 %   MCV  97.8 78.0 - 100.0 fL   MCH 33.2 26.0 - 34.0 pg   MCHC 34.0 30.0 - 36.0 g/dL   RDW 13.1 11.5 - 15.5 %   Platelets 294 150 - 400 K/uL    Comment: Performed at [facility], [address], [place], [place] [address]  Basic metabolic panel     Status: Abnormal   Collection Time: 05/10/18  3:30 AM  Result Value Ref Range   Sodium 134 (L) 135 - 145 mmol/L   Potassium 3.8 3.5 - 5.1 mmol/L   Chloride 98 98 - 111 mmol/L   CO2 28 22 - 32 mmol/L   Glucose, Bld 179 (H) 70 - 99 mg/dL   BUN 12 8 - 23 mg/dL   Creatinine, Ser 0.70 0.61 - 1.24 mg/dL   Calcium 8.6 (L) 8.9 - 10.3 mg/dL   GFR calc non Af Amer >60 >60 mL/min   GFR calc Af Amer >60 >60 mL/min    Comment: (NOTE) The eGFR has been calculated using the CKD EPI equation. This calculation has not been validated in all clinical situations. eGFR's persistently <60 mL/min signify possible Chronic Kidney Disease.    Anion gap 8 5 - 15    Comment: Performed at [facility], [address] [place], [place] [address]  Glucose, capillary  Status: Abnormal   Collection Time: 05/10/18  7:31 AM  Result Value Ref Range   Glucose-Capillary 146 (H) 70 - 99 mg/dL  Glucose, capillary     Status: Abnormal   Collection Time: 05/10/18 11:11 AM  Result Value Ref Range   Glucose-Capillary 197 (H) 70 - 99 mg/dL  Glucose, capillary     Status: Abnormal   Collection Time: 05/10/18  4:12 PM  Result Value Ref Range   Glucose-Capillary 125 (H) 70 - 99 mg/dL  Glucose, capillary     Status: Abnormal   Collection Time: 05/10/18  8:32 PM  Result Value Ref Range   Glucose-Capillary 163 (H) 70 - 99 mg/dL  Glucose, capillary     Status: Abnormal   Collection Time: 05/11/18  7:34 AM  Result Value Ref Range   Glucose-Capillary 154 (H) 70 - 99 mg/dL   Comment 1 Notify RN    Comment 2 Document in Chart     ABGS No results for input(s): PHART, PO2ART, TCO2, HCO3 in the last 72 hours.  Invalid input(s): PCO2 CULTURES Recent Results (from the past 240  hour(s))  Culture, blood (routine x 2)     Status: None (Preliminary result)   Collection Time: 05/07/18 11:42 AM  Result Value Ref Range Status   Specimen Description RIGHT ANTECUBITAL  Final   Special Requests   Final    BOTTLES DRAWN AEROBIC AND ANAEROBIC Blood Culture adequate volume   Culture   Final    NO GROWTH 4 DAYS Performed at [facility], [address], [place], [place] [address]    Report Status PENDING  Incomplete  Culture, blood (routine x 2)     Status: None (Preliminary result)   Collection Time: 05/07/18  1:11 PM  Result Value Ref Range Status   Specimen Description RIGHT ANTECUBITAL  Final   Special Requests   Final    BOTTLES DRAWN AEROBIC AND ANAEROBIC Blood Culture adequate volume   Culture   Final    NO GROWTH 4 DAYS Performed at [facility], [address] [place], [place] [address]    Report Status PENDING  Incomplete  Aerobic Culture (superficial specimen)     Status: None (Preliminary result)   Collection Time: 05/08/18  1:27 PM  Result Value Ref Range Status   Specimen Description   Final    TOE LEFT Performed at [facility], [address] [place], [place] [address]    Special Requests   Final    Normal Performed at [facility], [address] [place], [place] [address]    Gram Stain NO WBC SEEN RARE GRAM POSITIVE COCCI   Final   Culture   Final    FEW PROTEUS MIRABILIS SUSCEPTIBILITIES TO FOLLOW Performed at [facility], [place] [address] [place], [place] [address]    Report Status PENDING  Incomplete   Studies/Results: No results found.  Medications:  Prior to Admission:  Medications Prior to Admission  Medication Sig Dispense Refill Last Dose  . aspirin EC 81 MG EC tablet Take 1 tablet (81 mg total) by mouth daily. (Patient taking differently: Take 81 mg by mouth at bedtime. )   05/06/2018 at Unknown time  . b complex vitamins tablet Take 1 tablet by mouth daily.   05/06/2018 at Unknown time  . carvedilol (COREG) 3.125 MG  tablet TAKE ONE TABLET BY MOUTH TWICE A DAY WITH A MEAL 180 tablet 3 05/06/2018 at 2100  . clopidogrel (PLAVIX) 75 MG tablet Take 75 mg  by mouth daily.    05/06/2018 at Unknown time  . Cyanocobalamin (CVS B-12) 1500 MCG TBDP Take 1 tablet by mouth daily.    05/06/2018 at Unknown time  . doxycycline (VIBRA-TABS) 100 MG tablet Take 1 tablet (100 mg total) by mouth 2 (two) times daily. 20 tablet 0 05/07/2018 at Unknown time  . DULoxetine (CYMBALTA) 60 MG capsule Take 60 mg by mouth 2 (two) times daily.   05/06/2018 at Unknown time  . furosemide (LASIX) 40 MG tablet Take 1 tablet (40 mg total) by mouth 2 (two) times daily. 180 tablet 3 05/06/2018 at Unknown time  . glimepiride (AMARYL) 4 MG tablet Take 4 mg by mouth daily with breakfast.   05/06/2018 at Unknown time  . levothyroxine (SYNTHROID, LEVOTHROID) 75 MCG tablet Take 75 mcg by mouth daily.   05/06/2018 at Unknown time  . lisinopril (PRINIVIL,ZESTRIL) 5 MG tablet TAKE ONE TABLET BY MOUTH TWICE A DAY 180 tablet 3 05/06/2018 at Unknown time  . midodrine (PROAMATINE) 5 MG tablet Take 5 mg by mouth daily as needed (WHEN bp LEVELS DROP).    Past Month at Unknown time  . potassium chloride SA (K-DUR,KLOR-CON) 20 MEQ tablet Take 1 tablet (20 mEq total) by mouth daily. 90 tablet 3 05/06/2018 at Unknown time  . pramipexole (MIRAPEX) 0.25 MG tablet Take 2 pills at 7 PM and 2 pills at 9 PM daily. 360 tablet 3 05/06/2018 at Unknown time  . pravastatin (PRAVACHOL) 40 MG tablet Take 40 mg by mouth every morning.    05/06/2018 at Unknown time  . rOPINIRole (REQUIP) 0.5 MG tablet Take 0.5 mg by mouth at bedtime.    05/06/2018 at Unknown time  . tamsulosin (FLOMAX) 0.4 MG CAPS capsule Take 1 capsule by mouth every morning.   05/06/2018 at Unknown time  . Thiamine Mononitrate (VITAMIN B1 PO) Take 1,000 mg by mouth 2 (two) times daily.   05/06/2018 at Unknown time  . nitroGLYCERIN (NITROSTAT) 0.4 MG SL tablet Place 1 tablet (0.4 mg total) under the tongue every 5 (five) minutes as  needed for chest pain. 25 tablet 3 unknown   Scheduled: . aspirin EC  81 mg Oral QHS  . carvedilol  3.125 mg Oral BID WC  . clopidogrel  75 mg Oral Daily  . DULoxetine  60 mg Oral BID  . enoxaparin (LOVENOX) injection  40 mg Subcutaneous Q24H  . feeding supplement (ENSURE ENLIVE)  237 mL Oral BID BM  . furosemide  40 mg Oral Daily  . insulin aspart  0-15 Units Subcutaneous TID WC  . insulin aspart  0-5 Units Subcutaneous QHS  . levothyroxine  75 mcg Oral QAC breakfast  . potassium chloride SA  20 mEq Oral Daily  . pramipexole  0.5 mg Oral 2 times per day  . pravastatin  40 mg Oral q1800  . rOPINIRole  0.5 mg Oral QHS  . saccharomyces boulardii  250 mg Oral BID  . sodium chloride flush  10-40 mL Intracatheter Q12H  . sodium chloride flush  3 mL Intravenous Q12H  . tamsulosin  0.4 mg Oral q morning - 10a  . thiamine  100 mg Oral BID  . vitamin B-12  1,500 mcg Oral Daily   Continuous: . sodium chloride    . sodium chloride 75 mL/hr at 05/11/18 0400  . meropenem (MERREM) IV Stopped (05/11/18 0721)  . vancomycin Stopped (05/11/18 0555)   STM:HDQQIW chloride, acetaminophen **OR** acetaminophen, nitroGLYCERIN, ondansetron **OR** ondansetron (ZOFRAN) IV, oxyCODONE, senna-docusate, sodium chloride flush,  sodium chloride flush, traZODone  Assesment: He was admitted with cellulitis of his left foot but he actually has osteomyelitis.  He has peripheral arterial disease which complicates his situation.  He has diabetes which is fairly well controlled.  His foot is much better.  He is growing Proteus but we do not have sensitivities yet.  I discussed his situation with Dr. Arelia Longest from infectious disease today and he suggest that we send him home on ceftriaxone 2 g IV daily for 6 more weeks with Flagyl 500 mg p.o. 3 times daily for 6 more weeks. Principal Problem:   Cellulitis of left foot Active Problems:   History of stroke June 2013   Hypertension   Type 2 diabetes mellitus with circulatory  disorder (HCC)   Peripheral neuropathy   Cardiomyopathy- EF NL 01/2013, now 30-35% echo   Hyperlipidemia   Peripheral arterial disease (HCC)   Restless leg syndrome   Obstructive sleep apnea   Bilateral carotid artery disease ([place])    Plan: As above    LOS: 4 days   HAWKINS,EDWARD L 05/11/2018, 9:29 AM

## 2018-05-12 DIAGNOSIS — E1169 Type 2 diabetes mellitus with other specified complication: Secondary | ICD-10-CM | POA: Diagnosis not present

## 2018-05-12 DIAGNOSIS — Z9181 History of falling: Secondary | ICD-10-CM | POA: Diagnosis not present

## 2018-05-12 DIAGNOSIS — Z7902 Long term (current) use of antithrombotics/antiplatelets: Secondary | ICD-10-CM | POA: Diagnosis not present

## 2018-05-12 DIAGNOSIS — I6521 Occlusion and stenosis of right carotid artery: Secondary | ICD-10-CM | POA: Diagnosis not present

## 2018-05-12 DIAGNOSIS — L03116 Cellulitis of left lower limb: Secondary | ICD-10-CM | POA: Diagnosis not present

## 2018-05-12 DIAGNOSIS — E1142 Type 2 diabetes mellitus with diabetic polyneuropathy: Secondary | ICD-10-CM | POA: Diagnosis not present

## 2018-05-12 DIAGNOSIS — Z452 Encounter for adjustment and management of vascular access device: Secondary | ICD-10-CM | POA: Diagnosis not present

## 2018-05-12 DIAGNOSIS — G4733 Obstructive sleep apnea (adult) (pediatric): Secondary | ICD-10-CM | POA: Diagnosis not present

## 2018-05-12 DIAGNOSIS — I429 Cardiomyopathy, unspecified: Secondary | ICD-10-CM | POA: Diagnosis not present

## 2018-05-12 DIAGNOSIS — I951 Orthostatic hypotension: Secondary | ICD-10-CM | POA: Diagnosis not present

## 2018-05-12 DIAGNOSIS — Z8673 Personal history of transient ischemic attack (TIA), and cerebral infarction without residual deficits: Secondary | ICD-10-CM | POA: Diagnosis not present

## 2018-05-12 DIAGNOSIS — M19071 Primary osteoarthritis, right ankle and foot: Secondary | ICD-10-CM | POA: Diagnosis not present

## 2018-05-12 DIAGNOSIS — Z87891 Personal history of nicotine dependence: Secondary | ICD-10-CM | POA: Diagnosis not present

## 2018-05-12 DIAGNOSIS — Z792 Long term (current) use of antibiotics: Secondary | ICD-10-CM | POA: Diagnosis not present

## 2018-05-12 DIAGNOSIS — E669 Obesity, unspecified: Secondary | ICD-10-CM | POA: Diagnosis not present

## 2018-05-12 DIAGNOSIS — E1151 Type 2 diabetes mellitus with diabetic peripheral angiopathy without gangrene: Secondary | ICD-10-CM | POA: Diagnosis not present

## 2018-05-12 DIAGNOSIS — G2581 Restless legs syndrome: Secondary | ICD-10-CM | POA: Diagnosis not present

## 2018-05-12 DIAGNOSIS — E039 Hypothyroidism, unspecified: Secondary | ICD-10-CM | POA: Diagnosis not present

## 2018-05-12 DIAGNOSIS — Z7982 Long term (current) use of aspirin: Secondary | ICD-10-CM | POA: Diagnosis not present

## 2018-05-12 DIAGNOSIS — I509 Heart failure, unspecified: Secondary | ICD-10-CM | POA: Diagnosis not present

## 2018-05-12 DIAGNOSIS — I110 Hypertensive heart disease with heart failure: Secondary | ICD-10-CM | POA: Diagnosis not present

## 2018-05-12 DIAGNOSIS — M869 Osteomyelitis, unspecified: Secondary | ICD-10-CM | POA: Diagnosis not present

## 2018-05-12 LAB — AEROBIC CULTURE W GRAM STAIN (SUPERFICIAL SPECIMEN): Gram Stain: NONE SEEN

## 2018-05-12 LAB — CULTURE, BLOOD (ROUTINE X 2)
Culture: NO GROWTH
Culture: NO GROWTH
Special Requests: ADEQUATE
Special Requests: ADEQUATE

## 2018-05-12 LAB — AEROBIC CULTURE  (SUPERFICIAL SPECIMEN): Special Requests: NORMAL

## 2018-05-15 ENCOUNTER — Other Ambulatory Visit: Payer: Self-pay

## 2018-05-15 NOTE — Patient Outreach (Signed)
[facility]) Care Management  05/15/2018  DERRY KASSEL 24-Jan-1946 [id number]  72 year old male outreached by [facility] services for a 30 day post discharge medication review. PMHx includes, but not limited to, hypertension, cardiomyopathy, peripheral artery disease, Type 2 diabetes mellitus, congestive heart failure, arrhythmia and hyperlipidemia.   Unsuccessful outreach attempt #1 to Mr. Bur.   Left HIPAA compliant voice message requesting a return call.  Plan: Outreach attempt #2 in 3-4 business days.  Joetta Manners, PharmD Clinical Pharmacist [place] [phone]

## 2018-05-15 NOTE — Addendum Note (Signed)
Addended by: Joetta Manners D on: 05/15/2018 12:04 PM   Modules accepted: Orders

## 2018-05-15 NOTE — Patient Outreach (Signed)
05/15/2018 [facility]) Care Management  [place] [birth year] [id number]  72 year old male outreached by [facility] services for a 30 day post discharge medication review. PMHx includes, but not limited to, hypertension, cardiomyopathy, peripheral artery disease, Type 2 diabetes mellitus, congestive heart failure, arrhythmia and hyperlipidemia.   Unsuccessful outreach attempt #1 to [name].   Left HIPAA compliant voice message requesting a return call.  Plan: Outreach attempt #2 in 3-4 business days.  Joetta Manners, PharmD Clinical Pharmacist [place] [phone]  Addendum: Incoming call received from [name].  HIPAA identifiers verified.  Subjective: [name] states that he is on IV and oral antibiotics for 42 days for osteomyelitis of the toe.   He reports that he is being followed by [facility].  He states that he checks his CBGs daily, with recent values of 92 and 96, which he states is his normal.  He described one incidence of hypoglycemia a long time ago, but none since then.  He reports falling 5-6 times in the past year.  He states that he needs PT when his toe heals.   Patient is agreeable for [facility] diabetes coaching.   Objective:  HgA1c 6.9% on 05/07/18 SCr 0.7mg /dL on 05/10/18  Current Medications: Current Outpatient Medications  Medication Sig Dispense Refill  . aspirin EC 81 MG EC tablet Take 1 tablet (81 mg total) by mouth daily. (Patient taking differently: Take 81 mg by mouth at bedtime. )    . b complex vitamins tablet Take 1 tablet by mouth daily.    . carvedilol (COREG) 3.125 MG tablet TAKE ONE TABLET BY MOUTH TWICE A DAY WITH A MEAL 180 tablet 3  . cefTRIAXone (ROCEPHIN) IVPB Inject 2 g into the vein daily. Via PICC for 6 weeks for bone infection in toe    . Cholecalciferol (VITAMIN D3) 5000 units TABS Take 5,000 Units by mouth daily.    . clopidogrel (PLAVIX) 75 MG  tablet Take 75 mg by mouth daily.     . Cyanocobalamin ([employer]S B-12) 1500 MCG TBDP Take 1 tablet by mouth daily.     . DULoxetine (CYMBALTA) 60 MG capsule Take 60 mg by mouth at bedtime.     . Empagliflozin-linaGLIPtin (GLYXAMBI) 10-5 MG TABS Take 1 tablet by mouth at bedtime.    . furosemide (LASIX) 40 MG tablet Take 1 tablet (40 mg total) by mouth 2 (two) times daily. (Patient taking differently: Take 40 mg by mouth daily. ) 180 tablet 3  . glimepiride (AMARYL) 4 MG tablet Take 4 mg by mouth daily with breakfast.    . levothyroxine (SYNTHROID, LEVOTHROID) 75 MCG tablet Take 75 mcg by mouth daily.    . magnesium oxide (MAG-OX) 400 (241.3 Mg) MG tablet Take 400 mg by mouth daily.    . metroNIDAZOLE (FLAGYL) 500 MG tablet Take 1 tablet (500 mg total) by mouth every 8 (eight) hours. 126 tablet o  . potassium chloride SA (K-DUR,KLOR-CON) 20 MEQ tablet Take 1 tablet (20 mEq total) by mouth daily. (Patient taking differently: Take 20 mEq by mouth daily. Takes 1 every other day.) 90 tablet 3  . pramipexole (MIRAPEX) 0.25 MG tablet Take 2 pills at 7 PM and 2 pills at 9 PM daily. 360 tablet 3  . rosuvastatin (CRESTOR) 20 MG tablet Take 20 mg by mouth daily.    . tamsulosin (FLOMAX) 0.4 MG CAPS capsule Take 1 capsule by mouth every morning.    [name]  lisinopril (PRINIVIL,ZESTRIL) 5 MG tablet TAKE ONE TABLET BY MOUTH TWICE A DAY (Patient not taking: Reported on 05/15/2018) 180 tablet 3  . nitroGLYCERIN (NITROSTAT) 0.4 MG SL tablet Place 1 tablet (0.4 mg total) under the tongue every 5 (five) minutes as needed for chest pain. (Patient not taking: Reported on 05/15/2018) 25 tablet 3   No current facility-administered medications for this visit.     Functional Status: In your present state of health, do you have any difficulty performing the following activities: 05/07/2018  Hearing? Y  Vision? N  Difficulty concentrating or making decisions? N  Walking or climbing stairs? N  Dressing or bathing? N  Doing errands,  shopping? N  Some recent data might be hidden    Fall/Depression Screening: Fall Risk  04/19/2018 07/23/2016 06/29/2016  Falls in the past year? Yes No Yes  Number falls in past yr: 1 - 2 or more  Injury with Fall? Yes - No  Risk Factor Category  - - High Fall Risk  Comment - - Neuropathy, restless leg syndrome history  Risk for fall due to : History of fall(s);Impaired balance/gait - History of fall(s)  Follow up Education provided - Education provided   [facility] 2/9 Scores 07/23/2016 06/29/2016  PHQ - 2 Score 0 0   ASSESSMENT: Date Discharged from Hospital: 05/11/18 Date Medication Reconciliation Performed: 05/15/2018   Medications Discontinued at Discharge:   Doxycycline  New Medications at Discharge:   Ceftriaxone IV  Metronidazole oral  Patient was recently discharged from hospital and all medications have been reviewed  Drugs sorted by system:  Neurologic/Psychologic: duloxetine, pramipexole,   Cardiovascular: aspirin, carvedilol,clopidogrel, furosemide, lisinopril, nitroglycerin, potassium chloride, rosuvastatin  Endocrine: empagliflozin/linagliptin, glimepiride, levothyroxine  Vitamins/Minerals: B complex vitamin, cholecalciferol, cyanocobalamin, magnesium oxide  Infectious Diseases: ceftriaxone, metronidazole  Miscellaneous: tamsulosin  Medications to avoid in the elderly:  Per the [employer] List, the medication Glimepiride has a higher risk of severe prolonged hypoglycemia in older adults.  There is strong recommendation to avoid use in the elderly.  Other issues noted:  Patient reports taking Magnesium Oxide 1200 mg daily because he saw that "magnesium is good for you" on the TV.  Instructed him to read the bottle to see what the recommended dose is and not to take extra unless ordered by his doctor.  Patient will decrease his dose to 400 mg/day per product labelling.  He did report that his stools were looser while using the magnesium.  Instructed him to try Docusate  if needed when he reduces his magnesium dose.   Patient states that he is supposed to being weaning his duloxetine and is currently on 60 mg once daily instead of twice daily.   Patient reports that he is not taking  lisinopril.  Upon questioning, he states he is not sure if if was discontinued or if he has overlooked getting it refilled.  I will follow up with Dr. Luan Pulling.  It was listed on his discharge instructions.   PLAN: Route note to PCP, Dr. Luan Pulling.  Joetta Manners, PharmD Clinical Pharmacist [place] [phone]  Addendum: Incoming call received from Dr. Luan Pulling' office.  Patient should not be taking lisinopril.    Plan: Outreached to patient to to inform him that that he should not be on lisinopril.   I have removed from his Epic medication list.  Patient verbalized understanding.   Joetta Manners, PharmD Clinical Pharmacist [place] [phone]

## 2018-05-15 NOTE — Addendum Note (Signed)
Addended by: Joetta Manners D on: 05/15/2018 04:16 PM   Modules accepted: Orders

## 2018-05-16 DIAGNOSIS — M86172 Other acute osteomyelitis, left ankle and foot: Secondary | ICD-10-CM | POA: Diagnosis not present

## 2018-05-16 DIAGNOSIS — L03116 Cellulitis of left lower limb: Secondary | ICD-10-CM | POA: Diagnosis not present

## 2018-05-16 DIAGNOSIS — Z792 Long term (current) use of antibiotics: Secondary | ICD-10-CM | POA: Diagnosis not present

## 2018-05-16 NOTE — Addendum Note (Signed)
Addended by: Joetta Manners D on: 05/16/2018 09:11 AM   Modules accepted: Orders

## 2018-05-18 ENCOUNTER — Ambulatory Visit: Payer: PPO

## 2018-05-19 ENCOUNTER — Encounter: Payer: Self-pay | Admitting: Podiatry

## 2018-05-19 ENCOUNTER — Ambulatory Visit: Payer: PPO | Admitting: Podiatry

## 2018-05-19 ENCOUNTER — Ambulatory Visit (INDEPENDENT_AMBULATORY_CARE_PROVIDER_SITE_OTHER): Payer: PPO

## 2018-05-19 ENCOUNTER — Encounter

## 2018-05-19 VITALS — Temp 96.7°F

## 2018-05-19 DIAGNOSIS — L97521 Non-pressure chronic ulcer of other part of left foot limited to breakdown of skin: Secondary | ICD-10-CM | POA: Diagnosis not present

## 2018-05-19 DIAGNOSIS — E1169 Type 2 diabetes mellitus with other specified complication: Secondary | ICD-10-CM | POA: Diagnosis not present

## 2018-05-19 DIAGNOSIS — M869 Osteomyelitis, unspecified: Secondary | ICD-10-CM

## 2018-05-19 DIAGNOSIS — L97411 Non-pressure chronic ulcer of right heel and midfoot limited to breakdown of skin: Secondary | ICD-10-CM | POA: Diagnosis not present

## 2018-05-19 DIAGNOSIS — E11621 Type 2 diabetes mellitus with foot ulcer: Secondary | ICD-10-CM

## 2018-05-19 DIAGNOSIS — E08621 Diabetes mellitus due to underlying condition with foot ulcer: Secondary | ICD-10-CM

## 2018-05-19 NOTE — Progress Notes (Signed)
Subjective:   [name] presents to clinic today for follow up of ulcers b/l feet. He states he was hospitalized from Aug 18-22nd at [facility].   He voices no episodes of trauma to his feet. He says he was feeling fine until Thursday, Aug 15th, he noticed his left great toe was swollen. The next day, he noticed the toe was red to the level of the joint which prompted him to call our office. He asked for antibiotics. It was suggested he come in the office, but he said could not make it because he lives in [place]. We did call in doxycycline for him. On Sunday, he noticed his entire foot was swollen. His daughter is a Marine scientist and advised him to go to the emergency room. He was admitted to [facility] and osteomyelitis was confirmed via MRI. He now has a PICC line and per review of records, he is receiving IV antibiotics Rocephin 2g IV daily and Flagyl 500 mg every 8 hours. He was discharged home on Aug 22nd.  Today, he tells me the reason he could not come into the office was his wife was bitten by a spider and had cellulitis and he, understandably did not want to leave her alone.  Per review of medical records: Blood cultures were negative.  He grew Proteus from the drainage from his foot but it is unclear if that is the organism that is in his osteomyelitis.  He was started on meropenem and improved.   Infectious Disease at [facility] suggested ceftriaxone for 42 days at home with Flagyl 500 mg 3 times daily for 42 days at home.    [name] states his foot feels better since he's started the antibiotics.   Regarding his right foot ulceration, he voices no new concerns.  Allergies     CodeineNausea And Vomiting  TramadolNausea Only    Objective:  Oral Temp in Office: 96.89F  Pictures taken on today and in chart. Left hallux erythematous and swollen and tender plantarly. He does have some skin lines indicating swelling is decreasing. There is a moderate sized opened empty blister  roof surrounding the digit interdigitally extending to the plantar aspect of the hallux. I was not able to probe to any significant subcutaneous tissue. There was no drainage, no odor. No tracking/tunneling.  Right foot preulcerative lesion submetatarsal head 1 measures 2.5 x 1.0 cm with hyperkeratotic roof, visible subdermal hemorrhage. No flocculence, no periulcerative erythema, no edema, no pain on palpation.  Last HgA1C on August 18th was 6.9 mg/dL SED rate on 8/18: 58 mm/hr  Xrays taken in office toay of left foot today reveal osteolysis of the distal portion of the distal phalanx with the base remaining intact. No gas in tissues.  MRI report dated 05/07/2018 confirms osteomyelitis left distal phalanx.  Assessment: 1. Osteomyelitis distal phalanx left foot 2. Preulcerative callus right foot submet head 1 2. NIDDM with peripheral neuropathy   Plan: Blister roof removed and digit cleansed. Photos taken on today and placed in chart.   Discussed osteomyelitis and conservative vs. Surgical therapies available. He, Dr. Paulla Dolly and I discussed continuing the IV antibiotics and Flagyl per infectious disease recommendation to see if the digit will respond. If not, he was informed the distal phalanx (partial hallux) amputation would be necessary. [name] agrees with continuing the IV antibiotics/Flagyl treatment plan.  We will check SED rate, CBC, CMP on next visit. Will contact/coordinate with his  [place] for lab results/orders.  He  is receiving home health for administration of his IV antibiotics.   He was advised to keep the digit clean and dry and continue IV/oral antibiotics.  [name] was advised if he should experience changes in digit, such as increased redness, drainage, swelling accompanied by fever, chills, nightsweats, nausea, vomiting to report to ER. He is to continue to monitor his blood sugar and oral temperature and report any increases in either with  changes in his foot.  He is to follow up in 2 weeks, sooner if any problems arise. He was informed he may see any doctor in our office should he encounter any problems. He appeared pleased with current plan and related understanding of today's discussion.

## 2018-05-23 DIAGNOSIS — Z79899 Other long term (current) drug therapy: Secondary | ICD-10-CM | POA: Diagnosis not present

## 2018-05-23 DIAGNOSIS — L03116 Cellulitis of left lower limb: Secondary | ICD-10-CM | POA: Diagnosis not present

## 2018-05-29 DIAGNOSIS — H4922 Sixth [abducent] nerve palsy, left eye: Secondary | ICD-10-CM | POA: Diagnosis not present

## 2018-05-29 LAB — HM DIABETES EYE EXAM

## 2018-05-30 ENCOUNTER — Encounter (HOSPITAL_COMMUNITY): Payer: Self-pay | Admitting: Physical Therapy

## 2018-05-30 DIAGNOSIS — I951 Orthostatic hypotension: Secondary | ICD-10-CM | POA: Diagnosis not present

## 2018-05-30 DIAGNOSIS — E1169 Type 2 diabetes mellitus with other specified complication: Secondary | ICD-10-CM | POA: Diagnosis not present

## 2018-05-30 DIAGNOSIS — I6521 Occlusion and stenosis of right carotid artery: Secondary | ICD-10-CM | POA: Diagnosis not present

## 2018-05-30 DIAGNOSIS — L03116 Cellulitis of left lower limb: Secondary | ICD-10-CM | POA: Diagnosis not present

## 2018-05-30 DIAGNOSIS — G2581 Restless legs syndrome: Secondary | ICD-10-CM | POA: Diagnosis not present

## 2018-05-30 DIAGNOSIS — G4733 Obstructive sleep apnea (adult) (pediatric): Secondary | ICD-10-CM | POA: Diagnosis not present

## 2018-05-30 DIAGNOSIS — M869 Osteomyelitis, unspecified: Secondary | ICD-10-CM | POA: Diagnosis not present

## 2018-05-30 NOTE — Therapy (Signed)
[place], [place], [address] Phone: [phone]   Fax:  [phone]  Patient Details  Name: [name] MRN: [id number] Date of Birth: March 19, 1946 Referring Provider:  No ref. provider found  Encounter Date: 05/30/2018   PHYSICAL THERAPY DISCHARGE SUMMARY  Visits from Start of Care: 15  Current functional level related to goals / functional outcomes: Continues to have gait disturbance and decreased balance due to Decreased proprioception.   Remaining deficits: As above    Education / Equipment: HEP Plan: Patient agrees to discharge.  Patient goals were partially met. Patient is being discharged due to financial reasons.  ?????       Rayetta Humphrey, PT CLT [phone] 05/30/2018, 1:30 PM  [facility] [address] [place], [place], [address] Phone: [phone]   Fax:  [phone]

## 2018-06-02 ENCOUNTER — Ambulatory Visit: Payer: PPO | Admitting: Podiatry

## 2018-06-02 ENCOUNTER — Encounter: Payer: Self-pay | Admitting: Podiatry

## 2018-06-02 ENCOUNTER — Ambulatory Visit (INDEPENDENT_AMBULATORY_CARE_PROVIDER_SITE_OTHER): Payer: PPO

## 2018-06-02 DIAGNOSIS — L97411 Non-pressure chronic ulcer of right heel and midfoot limited to breakdown of skin: Secondary | ICD-10-CM | POA: Diagnosis not present

## 2018-06-02 DIAGNOSIS — E1169 Type 2 diabetes mellitus with other specified complication: Secondary | ICD-10-CM

## 2018-06-02 DIAGNOSIS — E08621 Diabetes mellitus due to underlying condition with foot ulcer: Secondary | ICD-10-CM | POA: Diagnosis not present

## 2018-06-02 DIAGNOSIS — M869 Osteomyelitis, unspecified: Secondary | ICD-10-CM | POA: Diagnosis not present

## 2018-06-02 DIAGNOSIS — S90221A Contusion of right lesser toe(s) with damage to nail, initial encounter: Secondary | ICD-10-CM

## 2018-06-02 NOTE — Progress Notes (Signed)
Subjective: [name] presents today for follow up of osteomyelitis left hallux. He states his toe is much better. He denies any fever, chills, nightsweats, nausea or vomiting.   He has PICC line and continues 2 gm IV Rocephin/day and Flagyl 500 mg every 8 hours per day. He has completed three weeks of IV antibiotics.  He states he is not scheduled to see his PCP until his antibiotic course is complete.  Objective: Neurovascular status unchanged Photos taken today and placed in chart. Left great toe continues to improve with marked decrease in edema. No tenderness to palpation.   Two calloused areas noted on left hallux: 1. Plantarly: near distal tip of digit, area measures 1.0 x 2.0 cm. No flocculence, no erythema, no drainage, no pain.  2. Plantarmedial left hallux: Hyperkeratosis 2.0 x 2.0 cm with visible subdermal hemorrhage. No tenderness to palpation, no flocculence, no erythema, no edema, no pain nor drainage.  Right foot preulcerative callus: Right foot preulcerative lesion submetatarsal head 1: 2.0x 1.5 cm with hyperkeratotic roof, visible subdermal hemorrhage. No flocculence, no erythema, no edema, no pain on palpation.  Right 5th digit with serous drainage noted to be oozing from under the nailplate consistent with subungual seroma. Entire nailplate lifted from nailbed. No erythema, no edema, no purulence nor deep abscess appreciated. No swelling of digit.  Xrays taken today reveal osteolysis of distal 1/2 of distal phalanx with slcerosis noted at distal edge of remaining bone.  Assessment: 1. Osteomyelitis left hallux, improving with intravenous antibiotics  and Flagyl 2. Preulcerative lesion submetatarsal head 1 right foot 3. Subungual seroma right 5th digit.  Plan: 1. Continue IV Rocephin and Flagyl antibiotics per PCP.  2. Debrided preulcerative callus right foot and preulcerative callus left great toe. No dressings necessary. Continue to keep left foot dry. Provided  cushion for left great toe. He is to apply during the day and remove every evening.  Xrays taken today left foot. Ordered SED rate and CBC with diff on today. 3. Evacuated subungual seroma right 5th digit and debrided nail to level of adherence. Nailbed cleansed with alcohol and Silvadene Cream and bandaid applied. Mr. Gatson was instructed to apply Neosporin to digit daily. 4. He is to follow up in 3 weeks unless his symptoms worsen. He is to call the office if he experiences any concerns. If he experiences swelling, drainage, pain, odor, redness of left great toe, he is to call the office. He is to report to local ER with fever, chills, nightsweats, nausea/vomiting associated with worsening of left great toe. He related understanding.

## 2018-06-03 LAB — CBC WITH DIFFERENTIAL/PLATELET
Basophils Absolute: 31 cells/uL (ref 0–200)
Basophils Relative: 0.5 %
Eosinophils Absolute: 81 cells/uL (ref 15–500)
Eosinophils Relative: 1.3 %
HCT: 32.2 % — ABNORMAL LOW (ref 38.5–50.0)
Hemoglobin: 11.1 g/dL — ABNORMAL LOW (ref 13.2–17.1)
Lymphs Abs: 1246 cells/uL (ref 850–3900)
MCH: 33.6 pg — ABNORMAL HIGH (ref 27.0–33.0)
MCHC: 34.5 g/dL (ref 32.0–36.0)
MCV: 97.6 fL (ref 80.0–100.0)
MPV: 11.6 fL (ref 7.5–12.5)
Monocytes Relative: 7.1 %
Neutro Abs: 4402 cells/uL (ref 1500–7800)
Neutrophils Relative %: 71 %
Platelets: 197 10*3/uL (ref 140–400)
RBC: 3.3 10*6/uL — ABNORMAL LOW (ref 4.20–5.80)
RDW: 13.6 % (ref 11.0–15.0)
Total Lymphocyte: 20.1 %
WBC mixed population: 440 cells/uL (ref 200–950)
WBC: 6.2 10*3/uL (ref 3.8–10.8)

## 2018-06-03 LAB — SEDIMENTATION RATE: Sed Rate: 11 mm/h (ref 0–20)

## 2018-06-05 ENCOUNTER — Telehealth: Payer: Self-pay | Admitting: *Deleted

## 2018-06-05 NOTE — Telephone Encounter (Signed)
-----   Message from Marzetta Board, MD sent at 06/04/2018  9:46 AM EDT ----- Hi [name]!  Can you please let [name] know his SED rate and WBC counts are normal?  We will see him on his next scheduled appointment on Oct 4th.  Thanks so much.

## 2018-06-05 NOTE — Telephone Encounter (Signed)
I informed pt of Dr. Galaway's review of results. 

## 2018-06-06 DIAGNOSIS — L03116 Cellulitis of left lower limb: Secondary | ICD-10-CM | POA: Diagnosis not present

## 2018-06-06 DIAGNOSIS — M869 Osteomyelitis, unspecified: Secondary | ICD-10-CM | POA: Diagnosis not present

## 2018-06-06 DIAGNOSIS — M19071 Primary osteoarthritis, right ankle and foot: Secondary | ICD-10-CM | POA: Diagnosis not present

## 2018-06-07 ENCOUNTER — Other Ambulatory Visit: Payer: Self-pay

## 2018-06-07 NOTE — Patient Outreach (Signed)
[facility]) Care Management  06/07/2018   [name] 1946/05/05 [id number]      Outreach attempt # 1 to the patient for initial assessment.  HIPAA verified with patient.  Discussed and offered [facility] care management services with patient. Patient verbally agreed to services.     Social: the patient lives in the home with his wife.  She is very supportive. The patient states that he is able to perform his ADLS independently and IADLS.  The patient has transportation to his appointments. The durable medical equipment in the home consist of Libre meter, cane, dentures and eyeglasses.  Conditions: Per chart review and conversation with the patient his conditions consist of HTN, DM type II, CHF, OSA, and Hyperlipidemia.  The patient denies any pain or falls today.  He states that he did not check his blood sugar yet this morning.  Yesterday his blood sugar was 124.  He has a Lobbyist.  He sees Dr Luan Pulling for his Diabetes every three months.  The patient states that he tries to monitor his food intake .  He does physical therapy exercises in the home three days a week.  He takes his medications as prescribed.  Medications: The patient is on nineteen medications.  He states that he is independent/assist with his medications.  He did  Not express any problems with paying for  His medications.  Appointments:  Patient states that he sees Dr Luan Pulling every three months. He saw Dr Elisha Ponder his podiatrist on 9/13 and sees her every three weeks.  Advanced Directives: The patient states that he has had information mailed to him .   Current Medications:  Current Outpatient Medications  Medication Sig Dispense Refill  . aspirin EC 81 MG EC tablet Take 1 tablet (81 mg total) by mouth daily. (Patient taking differently: Take 81 mg by mouth at bedtime. )    . b complex vitamins tablet Take 1 tablet by mouth daily.    . carvedilol (COREG) 3.125 MG tablet TAKE ONE TABLET BY MOUTH  TWICE A DAY WITH A MEAL 180 tablet 3  . cefTRIAXone (ROCEPHIN) 10 g injection     . Cholecalciferol (VITAMIN D3) 5000 units TABS Take 5,000 Units by mouth daily.    . clopidogrel (PLAVIX) 75 MG tablet Take 75 mg by mouth daily.     . Cyanocobalamin (CVS B-12) 1500 MCG TBDP Take 1 tablet by mouth daily.     . DULoxetine (CYMBALTA) 60 MG capsule Take 60 mg by mouth at bedtime.     . Empagliflozin-linaGLIPtin (GLYXAMBI) 10-5 MG TABS Take 1 tablet by mouth at bedtime.    . furosemide (LASIX) 40 MG tablet Take 1 tablet (40 mg total) by mouth 2 (two) times daily. (Patient taking differently: Take 40 mg by mouth daily. ) 180 tablet 3  . glimepiride (AMARYL) 4 MG tablet Take 4 mg by mouth daily with breakfast.    . levothyroxine (SYNTHROID, LEVOTHROID) 75 MCG tablet Take 75 mcg by mouth daily.    . magnesium oxide (MAG-OX) 400 (241.3 Mg) MG tablet Take 400 mg by mouth daily.    . metroNIDAZOLE (FLAGYL) 500 MG tablet Take 1 tablet (500 mg total) by mouth every 8 (eight) hours. 126 tablet o  . nitroGLYCERIN (NITROSTAT) 0.4 MG SL tablet Place 1 tablet (0.4 mg total) under the tongue every 5 (five) minutes as needed for chest pain. 25 tablet 3  . potassium chloride SA (K-DUR,KLOR-CON) 20 MEQ tablet Take 1 tablet (20  mEq total) by mouth daily. 90 tablet 3  . pramipexole (MIRAPEX) 0.25 MG tablet Take 2 pills at 7 PM and 2 pills at 9 PM daily. 360 tablet 3  . rosuvastatin (CRESTOR) 20 MG tablet Take 20 mg by mouth daily.    . tamsulosin (FLOMAX) 0.4 MG CAPS capsule Take 1 capsule by mouth every morning.    . cefTRIAXone (ROCEPHIN) IVPB Inject 2 g into the vein daily. Via PICC for 6 weeks for bone infection in toe     No current facility-administered medications for this visit.     Functional Status:  In your present state of health, do you have any difficulty performing the following activities: 06/07/2018 05/07/2018  Hearing? Tempie Donning  Vision? N N  Difficulty concentrating or making decisions? N N  Walking or  climbing stairs? Y N  Dressing or bathing? N N  Doing errands, shopping? N N  Some recent data might be hidden    Fall/Depression Screening: Fall Risk  06/07/2018 04/19/2018 07/23/2016  Falls in the past year? Yes Yes No  Number falls in past yr: 2 or more 1 -  Injury with Fall? (No Data) Yes -  Comment bruising - -  Risk Factor Category  - - -  Comment - - -  Risk for fall due to : Impaired balance/gait History of fall(s);Impaired balance/gait -  Follow up Falls evaluation completed;Falls prevention discussed;Education provided Education provided -   [facility] 2/9 Scores 06/07/2018 07/23/2016 06/29/2016  PHQ - 2 Score 0 0 0    Assessment: Patient will benefit from health coach outreach for disease management and support.   THN CM Care Plan Problem One     Most Recent Value  Care Plan Problem One  knowledge deficit related to diease management  Role Documenting the Problem One  [place] for Problem One  Active  THN Long Term Goal   in 90 days the pateint will maintain and a1c below 7.0  THN Long Term Goal Start Date  06/07/18  Interventions for Problem One [place] coach will send the patient educational material Living well with Diabetes.       Plan: RN Health Coach will provide ongoing education for patient on diabetes through phone calls and sending printed information to patient for further discussion.  RN Health Coach will send welcome packet with consent to patient as well as printed information on diabetes.  RN Health Coach will send initial barriers letter, assessment, and care plan to primary care physician. RN Health Coach will contact patient in the month of October and patient agrees to next outreach.   [name] RN, BSN, [place] Direct Dial:  [phone]  Fax: [phone]

## 2018-06-13 ENCOUNTER — Other Ambulatory Visit: Payer: Self-pay | Admitting: Cardiovascular Disease

## 2018-06-13 DIAGNOSIS — I6521 Occlusion and stenosis of right carotid artery: Secondary | ICD-10-CM | POA: Diagnosis not present

## 2018-06-13 DIAGNOSIS — M869 Osteomyelitis, unspecified: Secondary | ICD-10-CM | POA: Diagnosis not present

## 2018-06-13 DIAGNOSIS — E1169 Type 2 diabetes mellitus with other specified complication: Secondary | ICD-10-CM | POA: Diagnosis not present

## 2018-06-13 DIAGNOSIS — I951 Orthostatic hypotension: Secondary | ICD-10-CM | POA: Diagnosis not present

## 2018-06-13 DIAGNOSIS — L03116 Cellulitis of left lower limb: Secondary | ICD-10-CM | POA: Diagnosis not present

## 2018-06-13 DIAGNOSIS — G4733 Obstructive sleep apnea (adult) (pediatric): Secondary | ICD-10-CM | POA: Diagnosis not present

## 2018-06-13 DIAGNOSIS — Z5181 Encounter for therapeutic drug level monitoring: Secondary | ICD-10-CM | POA: Diagnosis not present

## 2018-06-13 DIAGNOSIS — G2581 Restless legs syndrome: Secondary | ICD-10-CM | POA: Diagnosis not present

## 2018-06-16 ENCOUNTER — Ambulatory Visit: Payer: PPO | Admitting: Podiatry

## 2018-06-20 DIAGNOSIS — Z792 Long term (current) use of antibiotics: Secondary | ICD-10-CM | POA: Diagnosis not present

## 2018-06-23 ENCOUNTER — Encounter: Payer: Self-pay | Admitting: Podiatry

## 2018-06-23 ENCOUNTER — Ambulatory Visit (INDEPENDENT_AMBULATORY_CARE_PROVIDER_SITE_OTHER): Payer: PPO | Admitting: Podiatry

## 2018-06-23 ENCOUNTER — Ambulatory Visit (INDEPENDENT_AMBULATORY_CARE_PROVIDER_SITE_OTHER): Payer: PPO

## 2018-06-23 DIAGNOSIS — M869 Osteomyelitis, unspecified: Secondary | ICD-10-CM | POA: Diagnosis not present

## 2018-06-23 DIAGNOSIS — E1169 Type 2 diabetes mellitus with other specified complication: Secondary | ICD-10-CM

## 2018-06-24 NOTE — Progress Notes (Signed)
Subjective: Mr. Bouldin is seen today for follow up osteomyelitis left hallux distal phalanx. He feels his toe continues to improve, but he does have some discomfort to the medial aspect of the toe from time to time.  He denies any fever, chills, nightsweats, nausea or vomiting. He denies any drainage from the toe.   He states his PICC line will be removed today and he has a few Flagyl 500 mg left and he will finish those.  He has now completed a total of 6 weeks of IV antibiotics.  He says he will see his PCP once the antibiotics are completed.  He relates his wife actually had a tick bite and is being treated/recovering from Lyme disease.   Objective: Neurovascular status unchanged  Left great toe continues to improve with decrease in edema. Slight tenderness and residual edema noted dorsomedial aspect of hallux. No drainage expressed from this area.   Two calloused areas noted on left hallux: 1. Plantarly: near distal tip of digit, area measures 1.0 x 2.0 cm. No flocculence, no erythema, no drainage, no pain.  2. Plantarmedial left hallux: Hyperkeratosis 2.0 x 2.0 cm with visible subdermal hemorrhage. No tenderness to palpation, no flocculence, no erythema, no edema, no pain nor drainage.  Right foot preulcerative callus: Right foot preulcerative lesion submetatarsal head 1: 2.0x 1.5 cm with hyperkeratotic roof, visible subdermal hemorrhage. No flocculence, no erythema, no edema, no pain on palpation.  Anonychia right 5th digit with intact nailbed. No active drainage. No edema, no erythema.No warmth.  Xrays taken today reveal there is the base and portion of the metaphyseal bone remaining. No gas in tissues noted.  Assessment: 1. Osteomyelitis left hallux being treated with intravenous antibiotics  and   Flagyl 2. Preulcerative lesion submetatarsal head 1 right foot 3. Subungual seroma right 5th digit, resolved.  Plan: 1. He has completed 6 weeks IV Rocephin and Flagyl  antibiotics per PCP/infectious disease. 2. Debrided preulcerative callus right foot and preulcerative callus left great toe. Continue to keep left foot dry. Continue cushion for left great toe. He is to apply during the day and remove every evening. Xrays taken today left foot. 3. Mr. Sesay is to follow up with his PCP and ID doctor as scheduled. 4. Today's xrays reviewed and discussed with Mr. Scogin and surgeon, Dr. March Rummage. Mr. Ticas is to follow up in one week. We will order SED rate, CBC at that time since he will be off of antibiotics. If he continues to have signs of infection, we discussed a partial hallux amputation removing the infected bone being another treatment option, afterwhich, his diabetic insole could be modified in his shoe. 4. He is to follow up in 1 week unless adverse symptoms return. He is to call the office if he experiences any concerns. If he experiences swelling, drainage, pain, odor, redness of left great toe, he is to call the office. He is to report to local ER with fever, chills, nightsweats, nausea/vomiting associated with worsening of left great toe. He related understanding.

## 2018-06-30 ENCOUNTER — Telehealth: Payer: Self-pay | Admitting: *Deleted

## 2018-06-30 ENCOUNTER — Encounter: Payer: Self-pay | Admitting: Podiatry

## 2018-06-30 ENCOUNTER — Other Ambulatory Visit: Payer: Self-pay | Admitting: Podiatry

## 2018-06-30 ENCOUNTER — Ambulatory Visit (INDEPENDENT_AMBULATORY_CARE_PROVIDER_SITE_OTHER): Payer: PPO

## 2018-06-30 ENCOUNTER — Ambulatory Visit (INDEPENDENT_AMBULATORY_CARE_PROVIDER_SITE_OTHER): Payer: PPO | Admitting: Podiatry

## 2018-06-30 DIAGNOSIS — M86672 Other chronic osteomyelitis, left ankle and foot: Secondary | ICD-10-CM

## 2018-06-30 DIAGNOSIS — I739 Peripheral vascular disease, unspecified: Secondary | ICD-10-CM

## 2018-06-30 DIAGNOSIS — M79672 Pain in left foot: Secondary | ICD-10-CM

## 2018-06-30 DIAGNOSIS — M869 Osteomyelitis, unspecified: Secondary | ICD-10-CM

## 2018-06-30 DIAGNOSIS — R0989 Other specified symptoms and signs involving the circulatory and respiratory systems: Secondary | ICD-10-CM

## 2018-06-30 MED ORDER — AMOXICILLIN-POT CLAVULANATE 875-125 MG PO TABS: 1.0000 | ORAL_TABLET | Freq: Two times a day (BID) | ORAL | 0 refills | Status: DC

## 2018-06-30 NOTE — Telephone Encounter (Signed)
Left a voicemail message for patient to let them know that Dr. Elisha Ponder sent them a prescription for Augmentin to the [facility].

## 2018-06-30 NOTE — Telephone Encounter (Signed)
-----   Message from Evelina Bucy, DPM sent at 06/30/2018  4:24 PM EDT ----- Can we get vascular studies before next week? ----- Message ----- From: Marzetta Board, MD Sent: 06/30/2018   4:23 PM EDT To: Evelina Bucy, DPM

## 2018-06-30 NOTE — Progress Notes (Signed)
Subjective: [name] has a one year history of ulcer of the left great toe. He is seen today for follow up osteomyelitis left hallux distal phalanx.   He has had a six week course of IV Rocephin. PICC line was removed last Friday, October 4th. He took his last Flagyl on last night.  Today, he relates his toe is red, painful and swollen. He denies any fever, chills, nightsweats, nausea or vomiting. He denies any drainage from the toe.   Objective: DP pulses palpable b/l PT pulses diminished b/l  Left great toe slightly edematous with erythema. No drainage expressed. Slightly warm. No lymphangitis, no forefoot edema.   Right foot preulcerative callus stable recently debrided last week. Right foot preulcerative lesion submetatarsal head 1:  No flocculence, no erythema, no edema, no pain on palpation.  Anonychia right 5th digit with intact nailbed. No active drainage. No edema, no erythema.No warmth.  Xrays taken today reveal there more erosion of the distal phalanx extending to the base of the bone  Culture from 05/08/2018 Component 33mo ago  Specimen Description TOE LEFT  Performed at [facility], [address] [place], [place] [address]   Special Requests Normal  Performed at [facility], [address], [place], [place] [address]   Gram Stain NO WBC SEEN  [place]  Performed at [facility] [address], [place], [place] [address]   Culture FEW PROTEUS MIRABILIS  FEW ENTEROCOCCUS FAECALIS   Report Status 05/12/2018 FINAL   Organism ID, Bacteria PROTEUS MIRABILIS   Organism ID, Bacteria ENTEROCOCCUS FAECALIS     Assessment: 1. Osteomyelitis left hallux s/p 6 week course of IV Rocephin/Flagyl; no improvement 2. NIDDM with neuropathy  Plan: 1. He has completed 6 weeks IV Rocephin and Flagyl antibiotics per  PCP/infectious disease [name], August 2019). 2. Xrays taken today confirm no resolution of osteomyelitis 3. Discussed with Drs.  Regal and Price, osteo has not responded to six week course of  IV Rocephin and Flagyl and there is continued infection of digit. Discussed best course is partial amputation of hallux (modified IPJ amputation). [name] is in agreement. [name] will be performing surgery. 4. Order ABIs/Vascular studies 5. Oral antibiotics sent to [facility] (Augmentin 875 mg po bid) 6.. He is to follow up in 1 week with [name] to discuss surgery/vascular study results. He is to call the office if he experiences any concerns. He is to report to local ER with fever, chills, nightsweats, nausea/vomiting associated with worsening of left great toe. He related understanding.

## 2018-06-30 NOTE — Telephone Encounter (Signed)
Orders to [facility] to be performed next week.

## 2018-07-04 ENCOUNTER — Ambulatory Visit (HOSPITAL_COMMUNITY)
Admission: RE | Admit: 2018-07-04 | Discharge: 2018-07-04 | Disposition: A | Discharge status: Home / Self Care | Payer: PPO | Source: Ambulatory Visit | Attending: Cardiovascular Disease | Admitting: Cardiovascular Disease

## 2018-07-04 DIAGNOSIS — M869 Osteomyelitis, unspecified: Secondary | ICD-10-CM | POA: Insufficient documentation

## 2018-07-04 DIAGNOSIS — I739 Peripheral vascular disease, unspecified: Secondary | ICD-10-CM | POA: Insufficient documentation

## 2018-07-04 DIAGNOSIS — R0989 Other specified symptoms and signs involving the circulatory and respiratory systems: Secondary | ICD-10-CM | POA: Insufficient documentation

## 2018-07-04 NOTE — Progress Notes (Signed)
Patient seen during visit. Discussed plan for amputation of the digit. Will have patient f/u with me to discuss plan for surgery.

## 2018-07-06 ENCOUNTER — Ambulatory Visit (INDEPENDENT_AMBULATORY_CARE_PROVIDER_SITE_OTHER): Payer: PPO | Admitting: Podiatry

## 2018-07-06 DIAGNOSIS — M869 Osteomyelitis, unspecified: Secondary | ICD-10-CM

## 2018-07-06 DIAGNOSIS — E1169 Type 2 diabetes mellitus with other specified complication: Secondary | ICD-10-CM

## 2018-07-06 DIAGNOSIS — R0989 Other specified symptoms and signs involving the circulatory and respiratory systems: Secondary | ICD-10-CM

## 2018-07-06 DIAGNOSIS — I739 Peripheral vascular disease, unspecified: Secondary | ICD-10-CM

## 2018-07-06 NOTE — Patient Instructions (Signed)
Pre-Operative Instructions  Congratulations, you have decided to take an important step towards improving your quality of life.  You can be assured that the doctors and staff at [facility] will be with you every step of the way.  Here are some important things you should know:  1. Plan to be at the surgery center/hospital at least 1 (one) hour prior to your scheduled time, unless otherwise directed by the surgical center/hospital staff.  You must have a responsible adult accompany you, remain during the surgery and drive you home.  Make sure you have directions to the surgical center/hospital to ensure you arrive on time. 2. If you are having surgery at [facility] or [facility] hospitals, you will need a copy of your medical history and physical form from your family physician within one month prior to the date of surgery. We will give you a form for your primary physician to complete.  3. We make every effort to accommodate the date you request for surgery.  However, there are times where surgery dates or times have to be moved.  We will contact you as soon as possible if a change in schedule is required.   4. No aspirin/ibuprofen for one week before surgery.  If you are on aspirin, any non-steroidal anti-inflammatory medications (Mobic, Aleve, Ibuprofen) should not be taken seven (7) days prior to your surgery.  You make take Tylenol for pain prior to surgery.  5. Medications - If you are taking daily heart and blood pressure medications, seizure, reflux, allergy, asthma, anxiety, pain or diabetes medications, make sure you notify the surgery center/hospital before the day of surgery so they can tell you which medications you should take or avoid the day of surgery. 6. No food or drink after midnight the night before surgery unless directed otherwise by surgical center/hospital staff. 7. No alcoholic beverages 24-hours prior to surgery.  No smoking 24-hours prior or 24-hours after  surgery. 8. Wear loose pants or shorts. They should be loose enough to fit over bandages, boots, and casts. 9. Don't wear slip-on shoes. Sneakers are preferred. 10. Bring your boot with you to the surgery center/hospital.  Also bring crutches or a walker if your physician has prescribed it for you.  If you do not have this equipment, it will be provided for you after surgery. 11. If you have not been contacted by the surgery center/hospital by the day before your surgery, call to confirm the date and time of your surgery. 12. Leave-time from work may vary depending on the type of surgery you have.  Appropriate arrangements should be made prior to surgery with your employer. 13. Prescriptions will be provided immediately following surgery by your doctor.  Fill these as soon as possible after surgery and take the medication as directed. Pain medications will not be refilled on weekends and must be approved by the doctor. 14. Remove nail polish on the operative foot and avoid getting pedicures prior to surgery. 15. Wash the night before surgery.  The night before surgery wash the foot and leg well with water and the antibacterial soap provided. Be sure to pay special attention to beneath the toenails and in between the toes.  Wash for at least three (3) minutes. Rinse thoroughly with water and dry well with a towel.  Perform this wash unless told not to do so by your physician.  Enclosed: 1 Ice pack (please put in freezer the night before surgery)   1 Hibiclens skin cleaner     Pre-op instructions  If you have any questions regarding the instructions, please do not hesitate to call our office.  [place]: [address], [place], [place] [address] -- [phone]  [place]: [address] [place], [place] [address] -- [phone]  [place]: [address]  [place], [place] [address] -- [phone]  [place]: [address], [place], [place] [address] -- [phone]  Website:  [email or web address] 

## 2018-07-07 ENCOUNTER — Telehealth: Payer: Self-pay | Admitting: *Deleted

## 2018-07-07 ENCOUNTER — Other Ambulatory Visit: Payer: Self-pay

## 2018-07-07 MED ORDER — MEDIHONEY WOUND/BURN DRESSING EX GEL: CUTANEOUS | 5 refills | Status: DC

## 2018-07-07 NOTE — Telephone Encounter (Signed)
Pt asked when the leg wraps can come off.

## 2018-07-07 NOTE — Telephone Encounter (Signed)
I informed pt Dr. March Rummage stated the unna boots stay on until removed Monday, then unna boots to B/L lower extremities Monday and Friday, wound care medihoney to left great toe Monday, Wednesday and Friday until surgery.

## 2018-07-07 NOTE — Patient Outreach (Signed)
[facility]) Care Management  07/07/2018   NECO KLING 1946/04/12 [id number]  Subjective: Successful outreach to the patient for assessment.  HIPAA verified by the patient.  The patient states that he has been doing fair.  He states that he has had a couple of falls.  He states " partly from carelessness and unsteady gait."  He states that he walks with a four pronged and ergonomic cane.  He states that he has only bruised himself when he has fallen.  He states that he is able to walk but when he walks if his leg is in a certain position he has a sharp pain.  The pain will go to a 10/10 quickly.  At rest he has no pain. Discussed with the patient about fall precautions and taking  with his physician at his appointment on the 30th. He verbalized understanding. The patient states that his blood sugar this morning was 124.  He has been monitoring his diet and taking his medications as prescribed. The patient states that he has been scheduled to have surgery on his left foot big toe in November.  He will have part of the toe removed to the 1st joint.  He is now waiting to have clearance from his family physician and cardiologist.    Current Medications:  Current Outpatient Medications  Medication Sig Dispense Refill  . aspirin EC 81 MG EC tablet Take 1 tablet (81 mg total) by mouth daily. (Patient taking differently: Take 81 mg by mouth at bedtime. )    . b complex vitamins tablet Take 1 tablet by mouth daily.    . carvedilol (COREG) 3.125 MG tablet TAKE ONE TABLET BY MOUTH TWICE A DAY WITH A MEAL 180 tablet 3  . Cholecalciferol (VITAMIN D3) 5000 units TABS Take 5,000 Units by mouth daily.    . clopidogrel (PLAVIX) 75 MG tablet Take 75 mg by mouth daily.     . Cyanocobalamin ([employer] B-12) 1500 MCG TBDP Take 1 tablet by mouth daily.     . DULoxetine (CYMBALTA) 60 MG capsule Take 60 mg by mouth at bedtime.     . Empagliflozin-linaGLIPtin (GLYXAMBI) 10-5 MG TABS Take 1 tablet by mouth  at bedtime.    . furosemide (LASIX) 40 MG tablet Take 1 tablet (40 mg total) by mouth 2 (two) times daily. Schedule appointment for refills 180 tablet 3  . glimepiride (AMARYL) 4 MG tablet Take 4 mg by mouth daily with breakfast.    . levothyroxine (SYNTHROID, LEVOTHROID) 75 MCG tablet Take 75 mcg by mouth daily.    . magnesium oxide (MAG-OX) 400 (241.3 Mg) MG tablet Take 400 mg by mouth daily.    . nitroGLYCERIN (NITROSTAT) 0.4 MG SL tablet Place 1 tablet (0.4 mg total) under the tongue every 5 (five) minutes as needed for chest pain. 25 tablet 3  . potassium chloride SA (K-DUR,KLOR-CON) 20 MEQ tablet Take 1 tablet (20 mEq total) by mouth daily. Please schedule appointment for refills 90 tablet 3  . pramipexole (MIRAPEX) 0.25 MG tablet Take 2 pills at 7 PM and 2 pills at 9 PM daily. 360 tablet 3  . rosuvastatin (CRESTOR) 20 MG tablet Take 20 mg by mouth daily.    . tamsulosin (FLOMAX) 0.4 MG CAPS capsule Take 1 capsule by mouth every morning.    [name] amoxicillin-clavulanate (AUGMENTIN) 875-125 MG tablet Take 1 tablet by mouth 2 (two) times daily for 14 days. (Patient not taking: Reported on 07/07/2018) 28 tablet 0  .  cefTRIAXone (ROCEPHIN) 10 g injection     . cefTRIAXone (ROCEPHIN) IVPB Inject 2 g into the vein daily. Via PICC for 6 weeks for bone infection in toe     No current facility-administered medications for this visit.     Functional Status:  In your present state of health, do you have any difficulty performing the following activities: 06/07/2018 05/07/2018  Hearing? Tempie Donning  Vision? N N  Difficulty concentrating or making decisions? N N  Walking or climbing stairs? Y N  Dressing or bathing? N N  Doing errands, shopping? N N  Some recent data might be hidden    Fall/Depression Screening: Fall Risk  07/07/2018 06/07/2018 04/19/2018  Falls in the past year? Yes Yes Yes  Number falls in past yr: 2 or more 2 or more 1  Injury with Fall? Yes (No Data) Yes  Comment brusing bruising -   Risk Factor Category  High Fall Risk - -  Comment - - -  Risk for fall due to : Impaired balance/gait Impaired balance/gait History of fall(s);Impaired balance/gait  Follow up Falls evaluation completed;Education provided;Falls prevention discussed Falls evaluation completed;Falls prevention discussed;Education provided Education provided   [facility] 2/9 Scores 06/07/2018 07/23/2016 06/29/2016  PHQ - 2 Score 0 0 0    Assessment: Patient will continue to benefit from health coach outreach for disease management and support.  THN CM Care Plan Problem One     Most Recent Value  THN Long Term Goal   In 30 days the patient will maintain low blood sugar before surgery  THN Long Term Goal Start Date  07/07/18  Interventions for Problem One Long Term Goal  Discussed diet , medication adherence,  THN CM Short Term Goal #1   Patient will discuss falls and unsteady gait /balance at his appointment on the 30th of october  [facility] CM Short Term Goal #1 Start Date  07/07/18  Interventions for Short Term Goal #1  discussed fall precations with the patient.       Plan: RN Health Coach will contact patient in the month of November and patient agrees to next outreach.  [name] RN, BSN, [place] Direct Dial:  [phone]  Fax: [phone]

## 2018-07-07 NOTE — Telephone Encounter (Signed)
I am calling you to find out if we can schedule your surgery for October 28."  I don't see my doctor until October 30.  It will have to be after that."  Can you do it on November 4?  "Yes, as far as I know that will be fine."  I'll get it scheduled.  "How long am I to wear these things on my legs before that girl takes them off?"  I cannot answer that question.  Let me transfer you to the nurse.  I transferred the call to [facility].   I am calling you back.  Dr. March Rummage wants to see you next week to do a wound check since your surgery is scheduled for November 4.  Would you like to schedule that appointment?  "Yes, let's schedule it."  I'm going to transfer you to a scheduler.  The call was transferred to [facility].

## 2018-07-07 NOTE — Telephone Encounter (Signed)
Required form, last available clinicals and demographics faxed to [facility].

## 2018-07-10 ENCOUNTER — Telehealth: Payer: Self-pay | Admitting: Podiatry

## 2018-07-10 NOTE — Telephone Encounter (Signed)
Patient would rather have his surgery done at [facility].

## 2018-07-11 ENCOUNTER — Telehealth: Payer: Self-pay | Admitting: *Deleted

## 2018-07-11 NOTE — Telephone Encounter (Signed)
[place] [place] states they do not have certification for [facility], but do accept [facility].

## 2018-07-11 NOTE — Telephone Encounter (Signed)
[facility] states they do accept the HTA but do not have staffing.

## 2018-07-11 NOTE — Telephone Encounter (Signed)
I called [facility], Izora Gala states they have just processed the 07/07/2018 referral and they do not accept [employer].

## 2018-07-11 NOTE — Telephone Encounter (Signed)
[name] - [facility] states their Interior and spatial designer states they do not have staffing in the area, and will not be able to accept pt.

## 2018-07-11 NOTE — Telephone Encounter (Signed)
"  I'm calling in regards to the patient, [name].  He's scheduled for an outpatient surgery on November 4 at [facility].  We reached out to him and he said he wants to have his surgery at [facility].  Has he called you regarding this yet?"  No, I am not aware of this.  I will give him a call.  This is the only facility that was available.  "Okay, let us know what he decides."  I will.   I attempted to call the patient.  His home number was busy.  I left him a message on his mobile voicemail to give me a call back tomorrow regarding his appointment scheduled for November 4 at [facility].

## 2018-07-11 NOTE — Telephone Encounter (Signed)
[facility] states they do accept [facility], but have several staff out and will need to speak with their director to see if have staffing for the unna boots.

## 2018-07-11 NOTE — Telephone Encounter (Signed)
[facility] transferred to [name]. [name] states they accept HTA but do not have the staffing for [facility].

## 2018-07-11 NOTE — Telephone Encounter (Signed)
Pt called states he thought he was going to have the unna boots taken off, not put back on. I told pt the unna boots, are replaced to help "push" swelling out of the legs and medication takes care of the skin problem and he also has a toe ulcer that needs evaluation and treatment. Pt states he will be here tomorrow and would like to know if he will be able to have his surgery at [facility]. I told pt I would send that message to the surgery coordinator.

## 2018-07-11 NOTE — Telephone Encounter (Signed)
I informed pt and offered an appt tomorrow to have the unna boots removed and replaced 07/12/2018 on the Nursing schedule. Pt accepted and I told him I would have a scheduler call and schedule for tomorrow.

## 2018-07-12 ENCOUNTER — Telehealth: Payer: Self-pay | Admitting: Cardiovascular Disease

## 2018-07-12 ENCOUNTER — Encounter: Payer: Self-pay | Admitting: Podiatry

## 2018-07-12 ENCOUNTER — Ambulatory Visit: Payer: PPO | Admitting: Podiatry

## 2018-07-12 DIAGNOSIS — L97521 Non-pressure chronic ulcer of other part of left foot limited to breakdown of skin: Secondary | ICD-10-CM | POA: Diagnosis not present

## 2018-07-12 DIAGNOSIS — E11621 Type 2 diabetes mellitus with foot ulcer: Secondary | ICD-10-CM

## 2018-07-12 DIAGNOSIS — M869 Osteomyelitis, unspecified: Secondary | ICD-10-CM

## 2018-07-13 ENCOUNTER — Ambulatory Visit: Payer: PPO

## 2018-07-13 ENCOUNTER — Telehealth: Payer: Self-pay | Admitting: Podiatry

## 2018-07-13 ENCOUNTER — Telehealth: Payer: Self-pay | Admitting: *Deleted

## 2018-07-13 ENCOUNTER — Telehealth: Payer: Self-pay | Admitting: Cardiovascular Disease

## 2018-07-13 NOTE — Telephone Encounter (Signed)
Pt called to question an appt he thought he had with Dr. Stanford Breed on 11/5 which is for surgery clearance. The procedure (toe amputation) is on 11/4 with Dr. March Rummage.. I advised the pt that the actual OV with Dr. Stanford Breed is on 07/21/18 but the concern is that it may not be enough time to hold meds if that is what Dr. March Rummage wants.. I left a message for the Surgical Coordinator to find out what Dr. March Rummage needs for clearance in regards to the pts meds to see if the appt needs to be made sooner if possible.

## 2018-07-13 NOTE — Progress Notes (Signed)
Subjective:   Patient ID: [name], male   DOB: 72 y.o.   MRN: [id number]   HPI Patient states he is getting pain in his big toe left and also underneath his first metatarsal right and he is using the Unna boots to try to help the swelling in his ankles and his wife just put one on yesterday   ROS      Objective:  Physical Exam  Neurovascular status is unchanged with patient's left big toe showing chronic keratotic lesion with mild redness with patient slated for amputation with Dr. March Rummage.  Patient's right plantar first metatarsal has an area of keratotic tissue with mild redness surrounding the area measuring approximately 1 x 1 cm     Assessment:  Osteomyelitis left big toe slated for amputation with keratotic tissue sub-right first metatarsal     Plan:  Continue conservative treatment of the left and applied a cushion to try to take pressure and debrided tissue for the right I debrided the superficial ulceration flush the area out applied dressing and instructed on soaks.  Patient to see Dr. March Rummage in the next couple days and I would like for him to get the surgery as soon as possible and Dr. March Rummage will discuss with him

## 2018-07-13 NOTE — Telephone Encounter (Signed)
That is fine we can request [name] move it to [facility] when she is back.

## 2018-07-13 NOTE — Telephone Encounter (Signed)
This is Ann with Dr. Kennon Holter office calling back in regards to Mr. Busche. I'm anxious to take care of this pt. I'm worried that pt may need his med's prior to the surgery scheduled on 04 November and we are seeing him 01 November. I don't it to come to the pt's surgery being rescheduled. Do I need to move his appointment up for him to see a PA. Please call me back at [phone] and ask for Ann. Thank you.

## 2018-07-13 NOTE — Telephone Encounter (Signed)
Dr. March Rummage called and spoke to [facility] and told her he wants patient to continue medication.

## 2018-07-13 NOTE — Telephone Encounter (Signed)
Pt advised that I spoke with Dr. March Rummage and he is not requesting that any of his meds (plavix and ASA) be held prior to his surgery so he can keep his appt with Dr. Gwenlyn Found 07/21/18.

## 2018-07-13 NOTE — Telephone Encounter (Signed)
This is [name] calling from Dr. Kennon Holter office. [name] is having a foot amputation with Dr. March Rummage on 04 November and we are scheduled to see him on 01 November. I was wondering, if that is enough time, does Dr. March Rummage want any of his med's, Plavix, Asprin held longer than those days if we see him on 01 November and say that's okay. If you would give me a call back at [phone] and ask for me. Thank you.

## 2018-07-13 NOTE — Telephone Encounter (Signed)
Dr. March Rummage states he would like pt to have unna boots B/L lower extremities Tuesday and Thursday until surgery 07/24/2018. I informed pt of DR. Price's orders and pt agreed. Pt states he has been trying to get scheduled to have his surgery at [facility], he knows their systems and is comfortable. I told pt I would inform the surgery coordinator, Dr. March Rummage.

## 2018-07-13 NOTE — Telephone Encounter (Signed)
New Message:    Patient calling about a surgery he is soon has have. Patient has some qestins.

## 2018-07-13 NOTE — Telephone Encounter (Signed)
Called and gave [name] the servicing provider NPI# [id number] for [facility]. While trying to give a NPI number to Ileene Musa, RN was on the phone with the pt who stated he wants his surgery done at [facility] and not at [facility]. I informed [name] of this information and told her I would call her back.

## 2018-07-13 NOTE — Telephone Encounter (Signed)
This is [place] calling from [facility] [place]. This is in regards to a prior authorization you submitted on [name]. I'm needing the NPI number for the servicing provider. If you can please call me back at [phone] with that information.

## 2018-07-14 ENCOUNTER — Ambulatory Visit (INDEPENDENT_AMBULATORY_CARE_PROVIDER_SITE_OTHER): Payer: PPO | Admitting: Podiatry

## 2018-07-14 ENCOUNTER — Telehealth: Payer: Self-pay

## 2018-07-14 DIAGNOSIS — L97521 Non-pressure chronic ulcer of other part of left foot limited to breakdown of skin: Secondary | ICD-10-CM

## 2018-07-14 DIAGNOSIS — M869 Osteomyelitis, unspecified: Secondary | ICD-10-CM

## 2018-07-14 DIAGNOSIS — E11621 Type 2 diabetes mellitus with foot ulcer: Secondary | ICD-10-CM

## 2018-07-14 NOTE — Telephone Encounter (Signed)
   [facility] Pre-operative Risk Assessment    Request for surgical clearance:  1. What type of surgery is being performed? Amputation Toe interphalangeal Hallux Left   2. When is this surgery scheduled? pending  3. What type of clearance is required (medical clearance vs. Pharmacy clearance to hold med vs. Both)? both  4. Are there any medications that need to be held prior to surgery and how long? Not listed; patient on aspirin  5. Practice name and name of physician performing surgery? [facility], physician not listed  6. What is your office phone number (Which office is not specified)   7.   What is your office fax number [phone]  8.   Anesthesia type (None, local, MAC, general)? Not listed   Joaquim Lai 07/14/2018, 2:25 PM  _________________________________________________________________   (provider comments below)

## 2018-07-14 NOTE — Telephone Encounter (Signed)
   Primary Cardiologist: Dr Gwenlyn Found  Chart reviewed as part of pre-operative protocol coverage. Because of [place] past medical history and time since last visit, he/she will require a follow-up visit in order to better assess preoperative cardiovascular risk.  Pre-op covering staff: - Please schedule appointment with Dr Gwenlyn Found or Kerin Ransom PA and call patient to inform them. - Please contact requesting surgeon's office via preferred method (i.e, phone, fax) to inform them of need for appointment prior to surgery.  If applicable, this message will also be routed to pharmacy pool and/or primary cardiologist for input on holding anticoagulant/antiplatelet agent as requested below so that this information is available at time of patient's appointment.   Kerin Ransom, PA-C  07/14/2018, 2:40 PM

## 2018-07-14 NOTE — Telephone Encounter (Signed)
LMTCB to schedule appt with Dr. Gwenlyn Found for clearance.

## 2018-07-17 ENCOUNTER — Telehealth: Payer: Self-pay | Admitting: Podiatry

## 2018-07-17 MED ORDER — AMOXICILLIN-POT CLAVULANATE 875-125 MG PO TABS: 1.0000 | ORAL_TABLET | Freq: Two times a day (BID) | ORAL | 0 refills | Status: DC

## 2018-07-17 NOTE — Telephone Encounter (Signed)
This is Economist. Mr. Standish said that Dr. March Rummage was supposed to be calling in an antibiotic and we still have not received anything. Our number is [phone].

## 2018-07-17 NOTE — Telephone Encounter (Signed)
I informed pt of Dr. Price's orders. 

## 2018-07-17 NOTE — Telephone Encounter (Signed)
LMOVM TO CALL BACK CLINIC TO MAKE SURGICAL CLEARANCE APPT.

## 2018-07-17 NOTE — Telephone Encounter (Signed)
I'm calling because [facility] is still calling me about a surgery with Dr. March Rummage. That should have been canceled and rescheduled to 11/05 [place] [facility] [name]. I would like for your office to contact [facility] to let them know of the change. Also, Dr. March Rummage said he would send in a prescription this past Friday for me. It still has not been called in and I need it as its an antibiotic.

## 2018-07-17 NOTE — Telephone Encounter (Signed)
I rescheduled the patient's surgery from [facility] on 07/24/2018 to [facility] on 07/25/2018.  I called and informed Evangela at [facility].  She said the surgery had been approved and she said she would update it with the change in date and location.  She said she would fax it over to [place].   I am calling to let you know that we got your surgery moved from [facility] to [facility].  Your surgery date will be November 5.  "Yeah, Dr. March Rummage was working on getting it changed.  "He said he was going to give me a 14 day supply of antibiotics when I was there.  The pharmacist said they have not received it yet."  I will ask the nurse, Marcy Siren, about that.  Okay, thank you so much for your help."

## 2018-07-17 NOTE — Telephone Encounter (Signed)
Sent to pharmacy 

## 2018-07-17 NOTE — Telephone Encounter (Signed)
I informed pt Dr. March Rummage had sent in his antibiotic.

## 2018-07-18 ENCOUNTER — Ambulatory Visit (INDEPENDENT_AMBULATORY_CARE_PROVIDER_SITE_OTHER): Payer: PPO | Admitting: Podiatry

## 2018-07-18 DIAGNOSIS — R600 Localized edema: Secondary | ICD-10-CM

## 2018-07-18 DIAGNOSIS — I872 Venous insufficiency (chronic) (peripheral): Secondary | ICD-10-CM

## 2018-07-18 DIAGNOSIS — L97521 Non-pressure chronic ulcer of other part of left foot limited to breakdown of skin: Secondary | ICD-10-CM

## 2018-07-18 DIAGNOSIS — E11621 Type 2 diabetes mellitus with foot ulcer: Secondary | ICD-10-CM

## 2018-07-18 NOTE — Progress Notes (Signed)
Agree with plan 

## 2018-07-18 NOTE — Telephone Encounter (Signed)
Pt is seeing Dr. Gwenlyn Found 07/21/18. Closing encounter and deleting it out of the preop call back pool.

## 2018-07-18 NOTE — Progress Notes (Signed)
Patient is here today for follow-up appointment, to have Unna boots applied to both bilateral extremities.  He states that overall he is feeling pretty good, that his feet are not bothering him today.  Bilateral edema to both lower extremities, left greater than right.  Noted wound/ulcer to left hallux toe, around the wound has thick callus skin, but the center of the wound has pink healthy viable tissue.  No redness around the wound, no erythema, no purulent drainage noted.  The wound was cleansed with normal saline, and clean bandage was applied.  Bilateral Unna boots were applied.  Patient is to follow-up Thursday for removal and reapplication of Unna boots.

## 2018-07-20 ENCOUNTER — Ambulatory Visit: Payer: PPO

## 2018-07-20 DIAGNOSIS — R600 Localized edema: Secondary | ICD-10-CM

## 2018-07-20 DIAGNOSIS — E11621 Type 2 diabetes mellitus with foot ulcer: Secondary | ICD-10-CM

## 2018-07-20 DIAGNOSIS — L97521 Non-pressure chronic ulcer of other part of left foot limited to breakdown of skin: Secondary | ICD-10-CM

## 2018-07-20 NOTE — Patient Instructions (Signed)
Pre-Operative Instructions  Congratulations, you have decided to take an important step towards improving your quality of life.  You can be assured that the doctors and staff at [facility] will be with you every step of the way.  Here are some important things you should know:  1. Plan to be at the surgery center/hospital at least 1 (one) hour prior to your scheduled time, unless otherwise directed by the surgical center/hospital staff.  You must have a responsible adult accompany you, remain during the surgery and drive you home.  Make sure you have directions to the surgical center/hospital to ensure you arrive on time. 2. If you are having surgery at [facility] or [facility], you will need a copy of your medical history and physical form from your family physician within one month prior to the date of surgery. We will give you a form for your primary physician to complete.  3. We make every effort to accommodate the date you request for surgery.  However, there are times where surgery dates or times have to be moved.  We will contact you as soon as possible if a change in schedule is required.   4. No aspirin/ibuprofen for one week before surgery.  If you are on aspirin, any non-steroidal anti-inflammatory medications (Mobic, Aleve, Ibuprofen) should not be taken seven (7) days prior to your surgery.  You make take Tylenol for pain prior to surgery.  5. Medications - If you are taking daily heart and blood pressure medications, seizure, reflux, allergy, asthma, anxiety, pain or diabetes medications, make sure you notify the surgery center/hospital before the day of surgery so they can tell you which medications you should take or avoid the day of surgery. 6. No food or drink after midnight the night before surgery unless directed otherwise by surgical center/hospital staff. 7. No alcoholic beverages 24-hours prior to surgery.  No smoking 24-hours prior or 24-hours after  surgery. 8. Wear loose pants or shorts. They should be loose enough to fit over bandages, boots, and casts. 9. Don't wear slip-on shoes. Sneakers are preferred. 10. Bring your boot with you to the surgery center/hospital.  Also bring crutches or a walker if your physician has prescribed it for you.  If you do not have this equipment, it will be provided for you after surgery. 11. If you have not been contacted by the surgery center/hospital by the day before your surgery, call to confirm the date and time of your surgery. 12. Leave-time from work may vary depending on the type of surgery you have.  Appropriate arrangements should be made prior to surgery with your employer. 13. Prescriptions will be provided immediately following surgery by your doctor.  Fill these as soon as possible after surgery and take the medication as directed. Pain medications will not be refilled on weekends and must be approved by the doctor. 14. Remove nail polish on the operative foot and avoid getting pedicures prior to surgery. 15. Wash the night before surgery.  The night before surgery wash the foot and leg well with water and the antibacterial soap provided. Be sure to pay special attention to beneath the toenails and in between the toes.  Wash for at least three (3) minutes. Rinse thoroughly with water and dry well with a towel.  Perform this wash unless told not to do so by your physician.  Enclosed: 1 Ice pack (please put in freezer the night before surgery)   1 Hibiclens skin cleaner     Pre-op instructions  If you have any questions regarding the instructions, please do not hesitate to call our office.  [place]: [address], [place], [place] [address] -- [phone]  [place]: [address] [place], [place] [address] -- [phone]  [place]: [address]  [place], [place] [address] -- [phone]  [place]: [address], [address], [place], [place] [address] -- [phone]  Website:  [email or web address] 

## 2018-07-21 ENCOUNTER — Encounter: Payer: Self-pay | Admitting: Cardiovascular Disease

## 2018-07-21 ENCOUNTER — Ambulatory Visit: Payer: PPO | Admitting: Cardiovascular Disease

## 2018-07-21 VITALS — BP 126/74 | HR 80 | Ht 72.0 in | Wt 231.4 lb

## 2018-07-21 DIAGNOSIS — I10 Essential (primary) hypertension: Secondary | ICD-10-CM

## 2018-07-21 DIAGNOSIS — I739 Peripheral vascular disease, unspecified: Secondary | ICD-10-CM | POA: Diagnosis not present

## 2018-07-21 DIAGNOSIS — G4733 Obstructive sleep apnea (adult) (pediatric): Secondary | ICD-10-CM

## 2018-07-21 NOTE — Progress Notes (Signed)
07/21/2018 [name]   [birth year]  [id number]  Primary Physician [name], MD Primary Cardiologist: [name] MD Lupe Carney, [place]  HPI:  [name] is a 72 y.o.  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 He now works at [employer]. He was referred by Dr. [name] from [facility] for peripheral vasodilation because of nonhealing small ulcers on the dorsal surface of his great toes bilaterally. I last saw him in the office  03/22/2017. His cardiovascular risk factor profile is remarkable for treated hypertension, diabetes and hyperlipidemia. He has had a stroke 03/08/12 and has carotid disease followed by Dr. [name] ,. He denies chest pain, shortness of breath or claudication. He has had 2 small ulcers on the dorsal surface of both great toes for 6-9 months which have been slow to heal. Since I saw him last in November he has had an elective left carotid endarterectomy performed by Dr. [name] . He was also placed on CPAP because of obstructive sleep apnea which he is benefiting from. He is compliant with his CPAP.Marland KitchenHe has developed a nonhealing wound on his right great toe which had been fairly rapidly progressing and he sent back for further evaluation. I performed lower extremity Doppler studies last October suggesting tibial vessel disease..I performed angiography on him 03/19/14 demonstrating severe tibial disease and ultimately performed diamondback orbital rotational atherectomy of his right posterior tibial establishing excellent in-line flow. This resulted in ultimate healing of his right great toe ischemic ulcer, improvement in his symptoms and Dopplers as well. He has since developed an ischemic ulcer on the plantar surface of the medial aspect of his right foot as a result of trauma and is being treated by his podiatrist, Dr. Barkley Bruns.follow-up Dopplers  post intervention revealed an increase in his right ABI to 1.2 however, subsequent Dopplers performed 07/02/14 revealed a decrease in his right eye twice a day 0.75 with occlusion of his posterior tibial artery. Because of progression of the ulcer on the dorsal surface of his right heel 3 angina gram him 10/31/14 revealing a subtotally occluded right posterior tibial artery which I re-intervened on with an excellent clinical management result. His subsequent follow-up Dopplers performed 11/08/14 revealed an increase in his right ABI from 0.75 Up to 1.2. His ulcer is slowly healing. Since I saw him a year ago he's remained stable. His most recent lower extremity arterial Doppler studies performed 02/24/16 revealed normal ABIs bilaterally. Since I saw him 6 months ago he was admitted with congestive heart failure and 05/16/16 and diuresis. His EF was 30-35% by 2-D echo. He underwent right and left heart cath by Dr. Tamala Julian revealing an elevated LVEDP and moderate diffuse CAD. Medical therapy was recommended. Since I saw him in the office 6 months ago he's remained fairly stable. He is aware some restriction. He is medically compliant. He denies chest pain or shortness of breath. He has no new wounds on his feet.  Since I saw him in the office over a year ago he is remained stable.  He denies chest pain or shortness of breath.  He does have osteomyelitis on his left great toe and is scheduled for partial amputation by his podiatrist in the near future.  Recent lower extremity Dopplers performed 04/13/2018 revealed normal ABIs bilaterally with a widely patent right posterior tibial artery.  Current Meds  Medication Sig  . amoxicillin-clavulanate (AUGMENTIN) 875-125  MG tablet Take 1 tablet by mouth 2 (two) times daily for 14 days.  [name] aspirin EC 81 MG EC tablet Take 1 tablet (81 mg total) by mouth daily. (Patient taking differently: Take 81 mg by mouth at bedtime. )  . b complex vitamins tablet Take 1 tablet by mouth  daily.  . carvedilol (COREG) 3.125 MG tablet TAKE ONE TABLET BY MOUTH TWICE A DAY WITH A MEAL  . cefTRIAXone (ROCEPHIN) 10 g injection   . cefTRIAXone (ROCEPHIN) IVPB Inject 2 g into the vein daily. Via PICC for 6 weeks for bone infection in toe  . Cholecalciferol (VITAMIN D3) 5000 units TABS Take 5,000 Units by mouth daily.  . clopidogrel (PLAVIX) 75 MG tablet Take 75 mg by mouth daily.   . Cyanocobalamin ([employer] B-12) 1500 MCG TBDP Take 1 tablet by mouth daily.   . DULoxetine (CYMBALTA) 60 MG capsule Take 60 mg by mouth at bedtime.   . Empagliflozin-linaGLIPtin (GLYXAMBI) 10-5 MG TABS Take 1 tablet by mouth at bedtime.  . furosemide (LASIX) 40 MG tablet Take 1 tablet (40 mg total) by mouth 2 (two) times daily. Schedule appointment for refills  . glimepiride (AMARYL) 4 MG tablet Take 4 mg by mouth daily with breakfast.  . levothyroxine (SYNTHROID, LEVOTHROID) 75 MCG tablet Take 75 mcg by mouth daily.  . magnesium oxide (MAG-OX) 400 (241.3 Mg) MG tablet Take 400 mg by mouth daily.  . nitroGLYCERIN (NITROSTAT) 0.4 MG SL tablet Place 1 tablet (0.4 mg total) under the tongue every 5 (five) minutes as needed for chest pain.  . potassium chloride SA (K-DUR,KLOR-CON) 20 MEQ tablet Take 1 tablet (20 mEq total) by mouth daily. Please schedule appointment for refills  . pramipexole (MIRAPEX) 0.25 MG tablet Take 2 pills at 7 PM and 2 pills at 9 PM daily.  . rosuvastatin (CRESTOR) 20 MG tablet Take 20 mg by mouth daily.  . tamsulosin (FLOMAX) 0.4 MG CAPS capsule Take 1 capsule by mouth every morning.  . Wound Dressings (MEDIHONEY WOUND/BURN DRESSING) GEL Apply to affected are 3 times a week, and cover with sterile dressing.     Allergies  Allergen Reactions  . Codeine Nausea And Vomiting  . Tramadol Nausea Only    Social History   Socioeconomic History  . Marital status: Married    Spouse name: Not on file  . Number of children: Not on file  . Years of education: college  . Highest education  level: Not on file  Occupational History  . Occupation: Firefighter   Social Needs  . Financial resource strain: Not on file  . Food insecurity:    Worry: Not on file    Inability: Not on file  . Transportation needs:    Medical: Not on file    Non-medical: Not on file  Tobacco Use  . Smoking status: Former Smoker    Years: 1.00    Types: Pipe    Last attempt to quit: 07/08/1977    Years since quitting: 41.0  . Smokeless tobacco: Never Used  Substance and Sexual Activity  . Alcohol use: No    Alcohol/week: 0.0 standard drinks  . Drug use: No  . Sexual activity: Not on file  Lifestyle  . Physical activity:    Days per week: Not on file    Minutes per session: Not on file  . Stress: Not on file  Relationships  . Social connections:    Talks on phone: Not on file    Gets together:  Not on file    Attends religious service: Not on file    Active member of club or organization: Not on file    Attends meetings of clubs or organizations: Not on file    Relationship status: Not on file  . Intimate partner violence:    Fear of current or ex partner: Not on file    Emotionally abused: Not on file    Physically abused: Not on file    Forced sexual activity: Not on file  Other Topics Concern  . Not on file  Social History Narrative   Drinks 3 44oz cokes a day      Review of Systems: General: negative for chills, fever, night sweats or weight changes.  Cardiovascular: negative for chest pain, dyspnea on exertion, edema, orthopnea, palpitations, paroxysmal nocturnal dyspnea or shortness of breath Dermatological: negative for rash Respiratory: negative for cough or wheezing Urologic: negative for hematuria Abdominal: negative for nausea, vomiting, diarrhea, bright red blood per rectum, melena, or hematemesis Neurologic: negative for visual changes, syncope, or dizziness All other systems reviewed and are otherwise negative except as noted above.    Blood pressure 126/74,  pulse 80, height 6' (1.829 m), weight 231 lb 6.4 oz (105 kg).  General appearance: alert and no distress Neck: no adenopathy, no carotid bruit, no JVD, supple, symmetrical, trachea midline and thyroid not enlarged, symmetric, no tenderness/mass/nodules Lungs: clear to auscultation bilaterally Heart: regular rate and rhythm, S1, S2 normal, no murmur, click, rub or gallop Extremities: extremities normal, atraumatic, no cyanosis or edema Pulses: 2+ and symmetric Skin: Skin color, texture, turgor normal. No rashes or lesions Neurologic: Alert and oriented X 3, normal strength and tone. Normal symmetric reflexes. Normal coordination and gait  EKG not performed today  ASSESSMENT AND PLAN:   Hypertension Of essential hypertension her blood pressure measured today 126/74.  He is on carvedilol.  Continue current meds at current dosing.  Cardiomyopathy- EF NL 01/2013, now 30-35% echo History of nonischemic cardia myopathy with an EF 30 to 35% range cardiac catheterization performed by Dr. Tamala Julian August 2017 that showed moderate diffuse CAD.  Medical therapy was recommended at that time.  Hyperlipidemia History of hyperlipidemia on statin therapy profile performed 02/09/2018 revealing total cholesterol 169, LDL 103 and HDL of 41.  Peripheral arterial disease (HCC) History of peripheral arterial disease with critical limb ischemia status post right posterior tibial intervention by myself back in 2016 resulting in healing of his right great toe ulcer.  His most recent right lower extremity Dopplers performed 04/12/2018 revealed a right ABI of 1.16, left ABI of 1.28 with a widely patent right posterior tibial artery.  Patient now has a wound on his left great toe with evidence of osteomyelitis and is scheduled for partial amputation by his podiatrist.  Obstructive sleep apnea History of obstructive sleep apnea on CPAP      [name] MD [facility] 07/21/2018 11:50 AM

## 2018-07-21 NOTE — Assessment & Plan Note (Signed)
History of nonischemic cardia myopathy with an EF 30 to 35% range cardiac catheterization performed by Dr. Tamala Julian August 2017 that showed moderate diffuse CAD.  Medical therapy was recommended at that time.

## 2018-07-21 NOTE — Patient Instructions (Signed)
Medication Instructions:  Your physician recommends that you continue on your current medications as directed. Please refer to the Current Medication list given to you today.  If you need a refill on your cardiac medications before your next appointment, please call your pharmacy.   Lab work: none If you have labs (blood work) drawn today and your tests are completely normal, you will receive your results only by: [name] MyChart Message (if you have MyChart) OR . A paper copy in the mail If you have any lab test that is abnormal or we need to change your treatment, we will call you to review the results.  Testing/Procedures: Your physician has requested that you have a lower extremity arterial doppler- During this test, ultrasound is used to evaluate arterial blood flow in the legs. Allow approximately one hour for this exam.  Your physician has requested that you have an ankle brachial index (ABI). During this test an ultrasound and blood pressure cuff are used to evaluate the arteries that supply the arms and legs with blood. Allow thirty minutes for this exam. There are no restrictions or special instructions.  SCHEDULE FOR OCT. 2020  Follow-Up: At [facility], you and your health needs are our priority.  As part of our continuing mission to provide you with exceptional heart care, we have created designated Provider Care Teams.  These Care Teams include your primary Cardiologist (physician) and Advanced Practice Providers (APPs -  Physician Assistants and Nurse Practitioners) who all work together to provide you with the care you need, when you need it. You will need a follow up appointment in 12 months.  Please call our office 2 months in advance to schedule this appointment.  You may see Quay Burow, MD or one of the following Advanced Practice Providers on your designated Care Team:   Kerin Ransom, PA-C Roby Lofts, [place] . Sande Rives, PA-C  Any Other Special Instructions Will  Be Listed Below (If Applicable).

## 2018-07-21 NOTE — Assessment & Plan Note (Signed)
History of obstructive sleep apnea on CPAP. 

## 2018-07-21 NOTE — Assessment & Plan Note (Signed)
Of essential hypertension her blood pressure measured today 126/74.  He is on carvedilol.  Continue current meds at current dosing.

## 2018-07-21 NOTE — Assessment & Plan Note (Signed)
History of hyperlipidemia on statin therapy profile performed 02/09/2018 revealing total cholesterol 169, LDL 103 and HDL of 41.

## 2018-07-21 NOTE — Assessment & Plan Note (Signed)
History of peripheral arterial disease with critical limb ischemia status post right posterior tibial intervention by myself back in 2016 resulting in healing of his right great toe ulcer.  His most recent right lower extremity Dopplers performed 04/12/2018 revealed a right ABI of 1.16, left ABI of 1.28 with a widely patent right posterior tibial artery.  Patient now has a wound on his left great toe with evidence of osteomyelitis and is scheduled for partial amputation by his podiatrist.

## 2018-07-24 ENCOUNTER — Telehealth: Payer: Self-pay | Admitting: *Deleted

## 2018-07-24 ENCOUNTER — Encounter (HOSPITAL_COMMUNITY): Payer: Self-pay | Admitting: *Deleted

## 2018-07-24 ENCOUNTER — Other Ambulatory Visit: Payer: Self-pay

## 2018-07-24 NOTE — Progress Notes (Signed)
Pt made aware to not take Glimepiride on DOS. Pt made aware to check BG every 2 hours prior to arrival to hospital on DOS. Pt made aware to treat a BG < 70 with 4 ounces of apple juice, wait 15 minutes after intervention to recheck BG, if BG remains < 70, call Short Stay unit to speak with a nurse. Pt verbalized understanding of all pre-op instructions.
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
## 2018-07-24 NOTE — Progress Notes (Signed)
[name], Medical Coordinator, at Dr. Kennon Holter office stated that pt did not have an EKG during last visit; EKG needed DOS.

## 2018-07-24 NOTE — Progress Notes (Signed)
Pt denies SOB and chest pain. Pt stated that he is under the care of Dr. Gwenlyn Found, Cardiology. Pt stated that an EKG was performed at Dr. Kennon Holter office on 07/21/18 ( not currently scanned in [facility]).  Pt denies recent labs (including A1c). Pt denies having a chest x ray within the last year. Pt made aware to stop taking vitamins, fish oil and herbal medications. Do not take any NSAIDs ie: Ibuprofen, Advil, Naproxen (Aleve), Motrin, BC and Goody Powder. Delydia, Surgical Coordinator, stated that it was okay for pt to take Plavix on DOS. Pt verbalized understanding of all pre-op instructions.

## 2018-07-24 NOTE — Anesthesia Preprocedure Evaluation (Addendum)
Anesthesia Evaluation  Patient identified by MRN, date of birth, ID band Patient awake    Reviewed: Allergy & Precautions, NPO status , Patient's Chart, lab work & pertinent test results  History of Anesthesia Complications (+) PONV and history of anesthetic complications  Airway Mallampati: I  TM Distance: >3 FB Neck ROM: Full    Dental  (+) Edentulous Upper, Edentulous Lower, Dental Advisory Given   Pulmonary sleep apnea and Continuous Positive Airway Pressure Ventilation , former smoker,    Pulmonary exam normal        Cardiovascular hypertension, +CHF  Normal cardiovascular exam  Study Conclusions  - Left ventricle: The cavity size was moderately dilated. Wall   thickness was increased in a pattern of severe LVH. Systolic   function was moderately to severely reduced. The estimated   ejection fraction was in the range of 30% to 35%. Diffuse   hypokinesis. Doppler parameters are consistent with both elevated   ventricular end-diastolic filling pressure and elevated left   atrial filling pressure. - Left atrium: The atrium was moderately to severely dilated. - Atrial septum: No defect or patent foramen ovale was identified.   Neuro/Psych CVA negative psych ROS   GI/Hepatic Neg liver ROS, GERD  ,  Endo/Other  diabetes  Renal/GU negative Renal ROS     Musculoskeletal   Abdominal   Peds  Hematology   Anesthesia Other Findings   Reproductive/Obstetrics                           Anesthesia Physical Anesthesia Plan  ASA: III  Anesthesia Plan: MAC   Post-op Pain Management:    Induction:   PONV Risk Score and Plan: 2 and Ondansetron and Propofol infusion  Airway Management Planned: Natural Airway and Simple Face Mask  Additional Equipment:   Intra-op Plan:   Post-operative Plan:   Informed Consent: I have reviewed the patients History and Physical, chart, labs and discussed  the procedure including the risks, benefits and alternatives for the proposed anesthesia with the patient or authorized representative who has indicated his/her understanding and acceptance.   Dental advisory given  Plan Discussed with: CRNA, Anesthesiologist and Surgeon  Anesthesia Plan Comments: (History of nonischemic cardiomyopathy EF 30 to 35% by echo 04/2016. Cardiac cath 04/2016 showed moderate diffuse CAD.  Medical therapy was recommended at that time. Saw Dr. Gwenlyn Found 07/21/2018 and discussed upcoming toe amputation.)     Anesthesia Quick Evaluation

## 2018-07-24 NOTE — Telephone Encounter (Signed)
"  [name] is scheduled for surgery tomorrow.  He takes Plavix and he was not told to stop taking it.  Does he need to stop taking it?"  He does not need to stop taking it.  "He does not?"  That is correct, he does not need to stop taking it.

## 2018-07-25 ENCOUNTER — Ambulatory Visit (HOSPITAL_COMMUNITY): Payer: PPO | Admitting: Physician Assistant

## 2018-07-25 ENCOUNTER — Ambulatory Visit (HOSPITAL_COMMUNITY): Payer: PPO

## 2018-07-25 ENCOUNTER — Encounter (HOSPITAL_COMMUNITY)
Admission: RE | Disposition: A | Discharge status: Home / Self Care | Payer: Self-pay | Source: Ambulatory Visit | Attending: Podiatry

## 2018-07-25 ENCOUNTER — Encounter (HOSPITAL_COMMUNITY): Payer: Self-pay | Admitting: Certified Registered Nurse Anesthetist

## 2018-07-25 ENCOUNTER — Ambulatory Visit (HOSPITAL_COMMUNITY)
Admission: RE | Admit: 2018-07-25 | Discharge: 2018-07-25 | Disposition: A | Discharge status: Home / Self Care | Payer: PPO | Source: Ambulatory Visit | Attending: Podiatry | Admitting: Podiatry

## 2018-07-25 ENCOUNTER — Telehealth: Payer: Self-pay | Admitting: Sports Medicine

## 2018-07-25 DIAGNOSIS — L97524 Non-pressure chronic ulcer of other part of left foot with necrosis of bone: Secondary | ICD-10-CM

## 2018-07-25 DIAGNOSIS — E1152 Type 2 diabetes mellitus with diabetic peripheral angiopathy with gangrene: Secondary | ICD-10-CM | POA: Diagnosis not present

## 2018-07-25 DIAGNOSIS — Z79899 Other long term (current) drug therapy: Secondary | ICD-10-CM | POA: Insufficient documentation

## 2018-07-25 DIAGNOSIS — Z7982 Long term (current) use of aspirin: Secondary | ICD-10-CM | POA: Diagnosis not present

## 2018-07-25 DIAGNOSIS — Z79890 Hormone replacement therapy (postmenopausal): Secondary | ICD-10-CM | POA: Insufficient documentation

## 2018-07-25 DIAGNOSIS — Z8673 Personal history of transient ischemic attack (TIA), and cerebral infarction without residual deficits: Secondary | ICD-10-CM | POA: Insufficient documentation

## 2018-07-25 DIAGNOSIS — M7732 Calcaneal spur, left foot: Secondary | ICD-10-CM | POA: Diagnosis not present

## 2018-07-25 DIAGNOSIS — Z87891 Personal history of nicotine dependence: Secondary | ICD-10-CM | POA: Diagnosis not present

## 2018-07-25 DIAGNOSIS — I110 Hypertensive heart disease with heart failure: Secondary | ICD-10-CM | POA: Insufficient documentation

## 2018-07-25 DIAGNOSIS — E039 Hypothyroidism, unspecified: Secondary | ICD-10-CM | POA: Diagnosis not present

## 2018-07-25 DIAGNOSIS — Z9889 Other specified postprocedural states: Secondary | ICD-10-CM

## 2018-07-25 DIAGNOSIS — E1169 Type 2 diabetes mellitus with other specified complication: Secondary | ICD-10-CM | POA: Insufficient documentation

## 2018-07-25 DIAGNOSIS — E11621 Type 2 diabetes mellitus with foot ulcer: Secondary | ICD-10-CM | POA: Diagnosis not present

## 2018-07-25 DIAGNOSIS — M869 Osteomyelitis, unspecified: Secondary | ICD-10-CM

## 2018-07-25 DIAGNOSIS — Z7902 Long term (current) use of antithrombotics/antiplatelets: Secondary | ICD-10-CM | POA: Diagnosis not present

## 2018-07-25 DIAGNOSIS — Z7984 Long term (current) use of oral hypoglycemic drugs: Secondary | ICD-10-CM | POA: Diagnosis not present

## 2018-07-25 DIAGNOSIS — I509 Heart failure, unspecified: Secondary | ICD-10-CM | POA: Insufficient documentation

## 2018-07-25 DIAGNOSIS — M19072 Primary osteoarthritis, left ankle and foot: Secondary | ICD-10-CM | POA: Insufficient documentation

## 2018-07-25 DIAGNOSIS — I96 Gangrene, not elsewhere classified: Secondary | ICD-10-CM | POA: Diagnosis not present

## 2018-07-25 DIAGNOSIS — G4733 Obstructive sleep apnea (adult) (pediatric): Secondary | ICD-10-CM | POA: Insufficient documentation

## 2018-07-25 DIAGNOSIS — M86172 Other acute osteomyelitis, left ankle and foot: Secondary | ICD-10-CM | POA: Diagnosis not present

## 2018-07-25 HISTORY — DX: Presence of dental prosthetic device (complete) (partial): Z97.2

## 2018-07-25 HISTORY — PX: AMPUTATION TOE: SHX6595

## 2018-07-25 HISTORY — DX: Pneumonia, unspecified organism: J18.9

## 2018-07-25 HISTORY — PX: BONE BIOPSY: SHX375

## 2018-07-25 HISTORY — DX: Unspecified hearing loss, unspecified ear: H91.90

## 2018-07-25 HISTORY — DX: Osteomyelitis, unspecified: M86.9

## 2018-07-25 HISTORY — DX: Heart failure, unspecified: I50.9

## 2018-07-25 HISTORY — DX: Gastro-esophageal reflux disease without esophagitis: K21.9

## 2018-07-25 LAB — BASIC METABOLIC PANEL
Anion gap: 9 (ref 5–15)
BUN: 14 mg/dL (ref 8–23)
CO2: 27 mmol/L (ref 22–32)
Calcium: 8.8 mg/dL — ABNORMAL LOW (ref 8.9–10.3)
Chloride: 100 mmol/L (ref 98–111)
Creatinine, Ser: 0.82 mg/dL (ref 0.61–1.24)
GFR calc Af Amer: >60 mL/min (ref >60)
GFR calc non Af Amer: >60 mL/min (ref >60)
Glucose, Bld: 146 mg/dL — ABNORMAL HIGH (ref 70–99)
Potassium: 2.9 mmol/L — ABNORMAL LOW (ref 3.5–5.1)
Sodium: 136 mmol/L (ref 135–145)

## 2018-07-25 LAB — CBC
HCT: 35.9 % — ABNORMAL LOW (ref 39.0–52.0)
Hemoglobin: 11.8 g/dL — ABNORMAL LOW (ref 13.0–17.0)
MCH: 32.9 pg (ref 26.0–34.0)
MCHC: 32.9 g/dL (ref 30.0–36.0)
MCV: 100 fL (ref 80.0–100.0)
Platelets: 224 10*3/uL (ref 150–400)
RBC: 3.59 MIL/uL — ABNORMAL LOW (ref 4.22–5.81)
RDW: 13.8 % (ref 11.5–15.5)
WBC: 8.1 10*3/uL (ref 4.0–10.5)
nRBC: 0 % (ref 0.0–0.2)

## 2018-07-25 LAB — GLUCOSE, CAPILLARY
Glucose-Capillary: 146 mg/dL — ABNORMAL HIGH (ref 70–99)
Glucose-Capillary: 137 mg/dL — ABNORMAL HIGH (ref 70–99)

## 2018-07-25 SURGERY — AMPUTATION, TOE
Anesthesia: Monitor Anesthesia Care | Site: Toe | Laterality: Left

## 2018-07-25 MED ORDER — ONDANSETRON HCL 4 MG/2ML IJ SOLN
INTRAMUSCULAR | Status: DC | PRN
  Administered 2018-07-25: 4 mg via INTRAVENOUS

## 2018-07-25 MED ORDER — PHENYLEPHRINE 40 MCG/ML (10ML) SYRINGE FOR IV PUSH (FOR BLOOD PRESSURE SUPPORT)
PREFILLED_SYRINGE | INTRAVENOUS | Status: AC
  Filled 2018-07-25: qty 10

## 2018-07-25 MED ORDER — 0.9 % SODIUM CHLORIDE (POUR BTL) OPTIME
TOPICAL | Status: DC | PRN
  Administered 2018-07-25: 1000 mL

## 2018-07-25 MED ORDER — CEFAZOLIN SODIUM-DEXTROSE 2-3 GM-%(50ML) IV SOLR
INTRAVENOUS | Status: DC | PRN
  Administered 2018-07-25: 2 g via INTRAVENOUS

## 2018-07-25 MED ORDER — FENTANYL CITRATE (PF) 250 MCG/5ML IJ SOLN
INTRAMUSCULAR | Status: AC
  Filled 2018-07-25: qty 5

## 2018-07-25 MED ORDER — PROPOFOL 500 MG/50ML IV EMUL
INTRAVENOUS | Status: DC | PRN
  Administered 2018-07-25: 60 ug/kg/min via INTRAVENOUS

## 2018-07-25 MED ORDER — PROMETHAZINE HCL 25 MG/ML IJ SOLN: 6.2500 mg | INTRAMUSCULAR | Status: DC | PRN

## 2018-07-25 MED ORDER — VANCOMYCIN HCL 500 MG IV SOLR
INTRAVENOUS | Status: DC | PRN
  Administered 2018-07-25: 500 mg via TOPICAL

## 2018-07-25 MED ORDER — VANCOMYCIN HCL 500 MG IV SOLR
INTRAVENOUS | Status: AC
  Filled 2018-07-25: qty 500

## 2018-07-25 MED ORDER — PROPOFOL 10 MG/ML IV BOLUS
INTRAVENOUS | Status: AC
  Filled 2018-07-25: qty 20

## 2018-07-25 MED ORDER — LIDOCAINE 2% (20 MG/ML) 5 ML SYRINGE
INTRAMUSCULAR | Status: AC
  Filled 2018-07-25: qty 5

## 2018-07-25 MED ORDER — BUPIVACAINE HCL 0.5 % IJ SOLN
INTRAMUSCULAR | Status: DC | PRN
  Administered 2018-07-25: 50 mL

## 2018-07-25 MED ORDER — VANCOMYCIN HCL 500 MG IV SOLR: INTRAVENOUS | Status: AC | PRN

## 2018-07-25 MED ORDER — BUPIVACAINE HCL 0.5 % IJ SOLN
INTRAMUSCULAR | Status: AC
  Filled 2018-07-25: qty 1

## 2018-07-25 MED ORDER — DEXAMETHASONE SODIUM PHOSPHATE 10 MG/ML IJ SOLN
INTRAMUSCULAR | Status: DC | PRN
  Administered 2018-07-25: 5 mg via INTRAVENOUS

## 2018-07-25 MED ORDER — HYDROCODONE-ACETAMINOPHEN 5-325 MG PO TABS: 1.0000 | ORAL_TABLET | Freq: Four times a day (QID) | ORAL | 0 refills | Status: DC | PRN

## 2018-07-25 MED ORDER — AMOXICILLIN-POT CLAVULANATE 875-125 MG PO TABS: 1.0000 | ORAL_TABLET | Freq: Two times a day (BID) | ORAL | 0 refills | Status: AC

## 2018-07-25 MED ORDER — MIDAZOLAM HCL 2 MG/2ML IJ SOLN
INTRAMUSCULAR | Status: AC
  Filled 2018-07-25: qty 2

## 2018-07-25 MED ORDER — ONDANSETRON HCL 4 MG/2ML IJ SOLN
INTRAMUSCULAR | Status: AC
  Filled 2018-07-25: qty 2

## 2018-07-25 MED ORDER — LACTATED RINGERS IV SOLN
INTRAVENOUS | Status: DC | PRN
  Administered 2018-07-25: 07:00:00 via INTRAVENOUS

## 2018-07-25 MED ORDER — FENTANYL CITRATE (PF) 100 MCG/2ML IJ SOLN
INTRAMUSCULAR | Status: DC | PRN
  Administered 2018-07-25: 25 ug via INTRAVENOUS
  Administered 2018-07-25: 50 ug via INTRAVENOUS

## 2018-07-25 MED ORDER — FENTANYL CITRATE (PF) 100 MCG/2ML IJ SOLN: 25.0000 ug | INTRAMUSCULAR | Status: DC | PRN

## 2018-07-25 MED ORDER — DEXAMETHASONE SODIUM PHOSPHATE 10 MG/ML IJ SOLN
INTRAMUSCULAR | Status: AC
  Filled 2018-07-25: qty 1

## 2018-07-25 MED ORDER — PROPOFOL 10 MG/ML IV BOLUS
INTRAVENOUS | Status: DC | PRN
  Administered 2018-07-25: 20 mg via INTRAVENOUS

## 2018-07-25 SURGICAL SUPPLY — 37 items
BANDAGE ACE 4X5 VEL STRL LF (GAUZE/BANDAGES/DRESSINGS) ×3 IMPLANT
BANDAGE ELASTIC 4 VELCRO ST LF (GAUZE/BANDAGES/DRESSINGS) ×1 IMPLANT
BLADE LONG MED 31X9 (MISCELLANEOUS) ×1 IMPLANT
BLADE SAW RECIP 87.9 MT (BLADE) IMPLANT
BNDG GAUZE ELAST 4 BULKY (GAUZE/BANDAGES/DRESSINGS) ×3 IMPLANT
CONT SPEC 4OZ CLIKSEAL STRL BL (MISCELLANEOUS) ×3 IMPLANT
COVER SURGICAL LIGHT HANDLE (MISCELLANEOUS) ×3 IMPLANT
COVER WAND RF STERILE (DRAPES) ×2 IMPLANT
DRSG EMULSION OIL 3X3 NADH (GAUZE/BANDAGES/DRESSINGS) ×2 IMPLANT
ELECT CAUTERY BLADE 6.4 (BLADE) ×3 IMPLANT
ELECT REM PT RETURN 9FT ADLT (ELECTROSURGICAL)
ELECTRODE REM PT RTRN 9FT ADLT (ELECTROSURGICAL) ×2 IMPLANT
GAUZE SPONGE 4X4 12PLY STRL (GAUZE/BANDAGES/DRESSINGS) ×2 IMPLANT
GAUZE SPONGE 4X4 12PLY STRL LF (GAUZE/BANDAGES/DRESSINGS) ×1 IMPLANT
GAUZE XEROFORM 1X8 LF (GAUZE/BANDAGES/DRESSINGS) ×1 IMPLANT
GLOVE BIO SURGEON STRL SZ7.5 (GLOVE) ×3 IMPLANT
GLOVE BIOGEL PI IND STRL 8 (GLOVE) ×2 IMPLANT
GLOVE BIOGEL PI INDICATOR 8 (GLOVE) ×2
GOWN STRL REUS W/ TWL LRG LVL3 (GOWN DISPOSABLE) ×2 IMPLANT
GOWN STRL REUS W/ TWL XL LVL3 (GOWN DISPOSABLE) ×2 IMPLANT
GOWN STRL REUS W/TWL LRG LVL3 (GOWN DISPOSABLE) ×3
GOWN STRL REUS W/TWL XL LVL3 (GOWN DISPOSABLE) ×3
KIT BASIN OR (CUSTOM PROCEDURE TRAY) ×3 IMPLANT
KIT TURNOVER KIT B (KITS) ×3 IMPLANT
NDL HYPO 25GX1X1/2 BEV (NEEDLE) ×2 IMPLANT
NEEDLE HYPO 25GX1X1/2 BEV (NEEDLE) ×3 IMPLANT
NS IRRIG 1000ML POUR BTL (IV SOLUTION) ×3 IMPLANT
PACK ORTHO EXTREMITY (CUSTOM PROCEDURE TRAY) ×3 IMPLANT
PAD ARMBOARD 7.5X6 YLW CONV (MISCELLANEOUS) ×2 IMPLANT
SOL PREP POV-IOD 4OZ 10% (MISCELLANEOUS) ×2 IMPLANT
SOLUTION BETADINE 4OZ (MISCELLANEOUS) ×1 IMPLANT
STAPLER VISISTAT 35W (STAPLE) ×1 IMPLANT
SUT ETHILON 3 0 PS 1 (SUTURE) ×3 IMPLANT
SYR CONTROL 10ML LL (SYRINGE) ×3 IMPLANT
TOWEL OR 17X26 10 PK STRL BLUE (TOWEL DISPOSABLE) ×3 IMPLANT
TUBE CONNECTING 12X1/4 (SUCTIONS) ×3 IMPLANT
YANKAUER SUCT BULB TIP NO VENT (SUCTIONS) ×3 IMPLANT

## 2018-07-25 NOTE — Anesthesia Postprocedure Evaluation (Signed)
Anesthesia Post Note  Patient: [name]  Procedure(s) Performed: AMPUTATION TOE INTERPHALANGEAL HALLUX LEFT (Left Toe) SUPERFICIAL BONE BIOPSY (Left )     Patient location during evaluation: PACU Anesthesia Type: MAC Level of consciousness: awake and alert Pain management: pain level controlled Vital Signs Assessment: post-procedure vital signs reviewed and stable Respiratory status: spontaneous breathing and respiratory function stable Cardiovascular status: stable Postop Assessment: no apparent nausea or vomiting Anesthetic complications: no    Last Vitals:  Vitals:   07/25/18 0900 07/25/18 0906  BP:  125/72  Pulse: 78 77  Resp: (!) 23 20  Temp: (!) 36.4 C (!) 36.4 C  SpO2:  92%    Last Pain:  Vitals:   07/25/18 0920  TempSrc:   PainSc: 0-No pain                 Jyren Cerasoli DANIEL

## 2018-07-25 NOTE — Telephone Encounter (Signed)
Wife called and reports that surgical dressing has come off the foot. Wife states that her husband got up to go to restroom and dressing slipped off. I advised wife to redress and secure with tape and if dressing remains loose to call office in AM to come in to have his foot re-dressed. Otherwise wife reports that her husband is doing well with no pain or discomfort at the surgical site. -Dr. Cannon Kettle

## 2018-07-25 NOTE — Progress Notes (Signed)
Orthopedic Tech Progress Note Patient Details:  [name] November 28, 1945 [id number]  Ortho Devices Type of Ortho Device: Postop shoe/boot Ortho Device/Splint Location: lle Ortho Device/Splint Interventions: Application   Post Interventions Patient Tolerated: Well Instructions Provided: Care of device Viewed order from doctor's order list  Hildred Priest 07/25/2018, 8:55 AM

## 2018-07-25 NOTE — H&P (Signed)
  Subjective:  Patient ID: [name], male    DOB: [birth year],  MRN: [id number]  Patient presents today for outpatient surgery. Understands plan for the OR today. All questions answered.  Objective:   Vitals:   07/25/18 0629  BP: (!) 167/67  Pulse: 76  Resp: 18  Temp: 97.7 F (36.5 C)  SpO2: 99%   General AA&O x3. Normal mood and affect.  Vascular L foot warm and well perfused.  Neurologic Epicritic sensation grossly diminished.  Dermatologic L hallux open ulcer with bone exposure, no ascending erythema. L leg edema noted.  Orthopedic: Motor intact distally.    Assessment & Plan:  Patient was evaluated and treated and all questions answered.  L Great Toe Osteomyelitis -Consent reviewed and signed. -Surgical site marked.   -To OR today for L great toe bone biopsy, partial toe amputation.

## 2018-07-25 NOTE — Transfer of Care (Signed)
Immediate Anesthesia Transfer of Care Note  Patient: [name]  Procedure(s) Performed: AMPUTATION TOE INTERPHALANGEAL HALLUX LEFT (Left ) SUPERFICIAL BONE BIOPSY (Left )  Patient Location: PACU  Anesthesia Type:MAC  Level of Consciousness: drowsy and patient cooperative  Airway & Oxygen Therapy: Patient Spontanous Breathing and Patient connected to face mask oxygen  Post-op Assessment: Report given to RN, Post -op Vital signs reviewed and stable and Patient moving all extremities X 4  Post vital signs: Reviewed and stable  Last Vitals:  Vitals Value Taken Time  BP 134/74 07/25/2018  8:06 AM  Temp    Pulse 76 07/25/2018  8:07 AM  Resp 19 07/25/2018  8:07 AM  SpO2 99 % 07/25/2018  8:07 AM  Vitals shown include unvalidated device data.  Last Pain:  Vitals:   07/25/18 0630  TempSrc:   PainSc: 0-No pain      Patients Stated Pain Goal: 0 (76/16/07 3710)  Complications: No apparent anesthesia complications

## 2018-07-25 NOTE — Brief Op Note (Signed)
07/25/2018  8:18 AM  PATIENT:  [name]  72 y.o. male  PRE-OPERATIVE DIAGNOSIS:  OSTEOMYELITIS OF LEFT FOOT  POST-OPERATIVE DIAGNOSIS:  * No post-op diagnosis entered *  PROCEDURE:  Procedure(s): AMPUTATION TOE INTERPHALANGEAL HALLUX LEFT (Left) SUPERFICIAL BONE BIOPSY (Left)  SURGEON:  Surgeon(s) and Role:    * Evelina Bucy, DPM - Primary  PHYSICIAN ASSISTANT:   ASSISTANTS: none   ANESTHESIA:   local and MAC  EBL:  10 mL   BLOOD ADMINISTERED:none  DRAINS: none   LOCAL MEDICATIONS USED:  MARCAINE    and Amount: 10 ml  SPECIMEN:   ID Type Source Tests Collected by Time Destination  1 : BONE Biopsy LEFT Great toe Tissue Soft Tissue, Other SURGICAL PATHOLOGY Evelina Bucy, DPM 07/25/2018 0744   2 : LEFT GREAT Toe Tissue Soft Tissue, Other SURGICAL PATHOLOGY Evelina Bucy, DPM 07/25/2018 0747   3 : Soft Tissue Great Toe Tissue Soft Tissue, Other SURGICAL PATHOLOGY Evelina Bucy, DPM 07/25/2018 0748      DISPOSITION OF SPECIMEN:  As above  COUNTS:  YES  TOURNIQUET:  * No tourniquets in log *  DICTATION: .Note written in EPIC  PLAN OF CARE: Discharge to home after PACU  PATIENT DISPOSITION:  PACU - hemodynamically stable.   Delay start of Pharmacological VTE agent (>24hrs) due to surgical blood loss or risk of bleeding: not applicable

## 2018-07-25 NOTE — Op Note (Signed)
Patient Name: BRALYN FOLKERT DOB: 04-21-1946  MRN: [id number]   Date of Service: 07/25/18   Surgeon: Dr. Hardie Pulley, DPM Assistants: None Pre-operative Diagnosis: Osteomyelitis Post-operative Diagnosis: Same Procedures:             1) Bone Biopsy Left Great toe  2) Amputation L Great Toe - Interphalangeal joint  3) Resection of the left proximal phalanx. Pathology/Specimens: ID Type Source Tests Collected by Time Destination  1 : BONE Biopsy LEFT Great toe Tissue Soft Tissue, Other SURGICAL PATHOLOGY Evelina Bucy, DPM 07/25/2018 0744   2 : LEFT GREAT Toe Tissue Soft Tissue, Other SURGICAL PATHOLOGY Evelina Bucy, DPM 07/25/2018 0747   3 : Soft Tissue Great Toe Tissue Soft Tissue, Other SURGICAL PATHOLOGY Evelina Bucy, DPM 07/25/2018 0748    Anesthesia: MAC/local Hemostasis: Anatomic Estimated Blood Loss: 49m Materials: None Medications: None Complications: None  Indications for Procedure:  This is a 72y.o. male with a chronic wound toe the left great toe.  X-rays were taken and he had confirm ostium myelitis he has failed a trial of antibiotic therapy and wishes to proceed with amputation of the digit for care of osteomyelitis.   Procedure in Detail: Patient was identified in pre-operative holding area. Formal consent was signed and the left lower extremity was marked. Patient was brought back to the operating room and placed on the operating room table in the supine position. Anesthesia was induced.   The extremity was prepped and draped in the usual sterile fashion. Timeout was taken to confirm patient name, laterality, and procedure prior to incision. Attention was then directed to the left great toe where a fishmouth incision was made about the distal aspect of the toe just proximal to the area of ulceration.  Dissection was sharply carried down to bone down to level of the distal phalanx.  The distal phalanx bone was exposed there is an area of the bone was more soft  distally a sample of this bone was obtained with a rongeur for bone biopsy.  Attention was then directed the interphalangeal joint where the collateral ligaments were sharply freed and the distal phalanx of the toe and associated soft tissue was sharply excised and collected for gross pathology.  At this point there was not enough soft tissue to close the wound and thus it was indicated to resect part of the proximal phalanx to assist in closure.  This was determined rather than amputation at the metatarsophalangeal joint level to preserve the base the proximal phalanx and associated tendon attachments for better stability.  The proximal phalanx was then freed from the surrounding soft tissue and a portion of the head was removed with a oscillating blade.  The remaining bone was rasped smooth.  At this point the wound had enough soft tissue for closure.  The wound was copiously irrigated and a clean rongeur was used to sample the deep soft tissue for microbiologic culture.  Skin closure was then achieved with 3-0 nylon and skin staples. The foot was then dressed with xeroform, 4x4, kerlix, and ACE bandage. Patient tolerated the procedure well.   Disposition: Following a period of post-operative monitoring, patient will be transferred back home.

## 2018-07-25 NOTE — Progress Notes (Signed)
  Subjective:  Patient ID: [name], male    DOB: [birth year],  MRN: [id number]  No chief complaint on file.   72 y.o. male presents for wound care. Here for checkup prior to surgery. All questions about surgery answered, no new complaints.  Review of Systems: Negative except as noted in the HPI. Denies N/V/F/Ch.  Past Medical History:  Diagnosis Date  . Arthritis   . Cardiomyopathy- EF NL 01/2013, now 30-35% echo 03/14/2012  . Carotid artery occlusion    left s/p CEA  . CHF (congestive heart failure) ([place])   . Diabetes mellitus   . GERD (gastroesophageal reflux disease)   . HOH (hard of hearing)   . Hypertension   . Hypothyroidism   . Obstructive sleep apnea   . Orthostatic hypotension   . Osteomyelitis ([place])    left great toe  . Peripheral arterial disease ([place]), s/p PTA x 2 RLE    nonhealing ulcers bilaterally on each great toe  . Pneumonia   . PONV (postoperative nausea and vomiting)   . Restless leg syndrome   . Shortness of breath   . Stroke [facility]) March 08, 2012  . Wears dentures    No current outpatient medications on file.  Social History   Tobacco Use  Smoking Status Former Smoker  . Years: 1.00  . Types: Pipe  . Last attempt to quit: 07/08/1977  . Years since quitting: 41.0  Smokeless Tobacco Never Used    Allergies  Allergen Reactions  . Codeine Nausea And Vomiting  . Tramadol Nausea Only   Objective:  There were no vitals filed for this visit. There is no height or weight on file to calculate BMI. Constitutional Well developed. Well nourished.  Vascular Dorsalis pedis pulses palpable bilaterally. Posterior tibial pulses palpable bilaterally. Capillary refill normal to all digits.  No cyanosis or clubbing noted. Pedal hair growth normal.  Neurologic Normal speech. Oriented to person, place, and time. Protective sensation absent  Dermatologic Wound Location: L great toe Wound Base: Granular/Healthy Peri-wound: Calloused Exudate:  Moderate amount Serosanguinous exudate Probe to bone noted.  Orthopedic: No pain to palpation either foot.   Radiographs: none today.  Assessment:   1. Diabetic ulcer of toe of left foot associated with type 2 diabetes mellitus, limited to breakdown of skin ([place])   2. Toe osteomyelitis, left ([place])    Plan:  Patient was evaluated and treated and all questions answered.  Ulcer L Great toe with Osteomyelitis -Ulcer debrided as below. -Abx refilled until surgery. -Proceed with amputation as planned. -Debridement of the ulcer as below.  Procedure: Excisional Debridement of Wound Rationale: Removal of non-viable soft tissue from the wound to promote healing.  Anesthesia: none Pre-Debridement Wound Measurements: 0.5 cm x 0.5 cm x 0.3 cm  Post-Debridement Wound Measurements: 1 cm x 1 cm x 0.3 cm  Type of Debridement: Sharp Excisional Tissue Removed: Non-viable soft tissue Depth of Debridement: subcutaneous tissue. Technique: Sharp excisional debridement to bleeding, viable wound base.  Dressing: Dry, sterile, compression dressing. Disposition: Patient tolerated procedure well. Patient to return in 1 week for follow-up.   No follow-ups on file.

## 2018-07-25 NOTE — Progress Notes (Signed)
Subjective:  Patient ID: [name], male    DOB: [birth year],  MRN: [id number]  Chief Complaint  Patient presents with  . Foot Pain    One week with Dr. March Rummage to discuss surgery (ok to OVB per Dr. Mamie Nick)    72 y.o. male presents for wound care. Here to discus surgery.  Review of Systems: Negative except as noted in the HPI. Denies N/V/F/Ch.  Past Medical History:  Diagnosis Date  . Arthritis   . Cardiomyopathy- EF NL 01/2013, now 30-35% echo 03/14/2012  . Carotid artery occlusion    left s/p CEA  . CHF (congestive heart failure) ([place])   . Diabetes mellitus   . GERD (gastroesophageal reflux disease)   . HOH (hard of hearing)   . Hypertension   . Hypothyroidism   . Obstructive sleep apnea   . Orthostatic hypotension   . Osteomyelitis ([place])    left great toe  . Peripheral arterial disease ([place]), s/p PTA x 2 RLE    nonhealing ulcers bilaterally on each great toe  . Pneumonia   . PONV (postoperative nausea and vomiting)   . Restless leg syndrome   . Shortness of breath   . Stroke [facility]) March 08, 2012  . Wears dentures    No current outpatient medications on file.  Social History   Tobacco Use  Smoking Status Former Smoker  . Years: 1.00  . Types: Pipe  . Last attempt to quit: 07/08/1977  . Years since quitting: 41.0  Smokeless Tobacco Never Used    Allergies  Allergen Reactions  . Codeine Nausea And Vomiting  . Tramadol Nausea Only   Objective:  There were no vitals filed for this visit. There is no height or weight on file to calculate BMI. Constitutional Well developed. Well nourished.  Vascular Dorsalis pedis pulses palpable bilaterally. Posterior tibial pulses palpable bilaterally. Capillary refill normal to all digits.  No cyanosis or clubbing noted. Pedal hair growth normal.  Neurologic Normal speech. Oriented to person, place, and time. Protective sensation absent  Dermatologic Wound Location: L great toe Wound Base: Granular/Healthy Peri-wound:  Calloused Exudate: Moderate amount Serosanguinous exudate Probe to bone noted.  Orthopedic: No pain to palpation either foot.   Radiographs: Radiographs reviewed with erosion of the distal phalanx of the hallux noted.   Assessment:   1. Toe osteomyelitis, left ([place])   2. Diminished pulses in lower extremity   3. Peripheral arterial disease ([place])   4. Diabetic osteomyelitis ([place]), left hallux    Plan:  Patient was evaluated and treated and all questions answered.  Ulcer L Great toe with Osteomyelitis -XR reviewed. -Arterial studies reviewed suggestive of adequate perfusion -Reviewed surgical plan with patient.  Discussed that he would benefit from amputation for definitive cure of osteomyelitis.  Discussed that due to the erosion of only the distal phalanx become likely just amputated at the interphalangeal level and remain the base for better function.  Discussed that should the bone proximally prove problematic I recommend removal of that as well. -Patient has failed all conservative therapy and wishes to proceed with surgical intervention. All risks, benefits, and alternatives discussed with patient. No guarantees given. Consent reviewed and signed by patient. -Planned procedures: Partial toe amputation left great toe with bone biopsy, possible metatarsal phalangeal joint amputation -Dressed with medihoney, DSD.  25 minutes of face to face time were spent with the patient. >50% of this was spent on counseling and coordination of care. Specifically discussed with patient the above diagnoses and  overall treatment plan.   Return for post op.

## 2018-07-26 ENCOUNTER — Encounter (HOSPITAL_COMMUNITY): Payer: Self-pay | Admitting: Podiatry

## 2018-07-26 ENCOUNTER — Ambulatory Visit (INDEPENDENT_AMBULATORY_CARE_PROVIDER_SITE_OTHER): Payer: Self-pay

## 2018-07-26 ENCOUNTER — Telehealth: Payer: Self-pay | Admitting: Podiatry

## 2018-07-26 DIAGNOSIS — L97521 Non-pressure chronic ulcer of other part of left foot limited to breakdown of skin: Secondary | ICD-10-CM | POA: Diagnosis not present

## 2018-07-26 DIAGNOSIS — E11621 Type 2 diabetes mellitus with foot ulcer: Secondary | ICD-10-CM

## 2018-07-26 DIAGNOSIS — R600 Localized edema: Secondary | ICD-10-CM

## 2018-07-26 NOTE — Telephone Encounter (Signed)
I spoke with pt's wife, [name] and offered an appt on the Nurse's schedule to have new dressing applied. [name] states understanding and I transferred to scheduler.

## 2018-07-26 NOTE — Telephone Encounter (Signed)
My husband had surgery yesterday and his bandage came off last night. I called the other line and I'm just trying to find out what the next steps should be. My number is [phone].

## 2018-07-28 ENCOUNTER — Ambulatory Visit (INDEPENDENT_AMBULATORY_CARE_PROVIDER_SITE_OTHER): Payer: PPO | Admitting: Podiatry

## 2018-07-28 DIAGNOSIS — L608 Other nail disorders: Secondary | ICD-10-CM

## 2018-07-28 DIAGNOSIS — E11621 Type 2 diabetes mellitus with foot ulcer: Secondary | ICD-10-CM

## 2018-07-28 DIAGNOSIS — L97521 Non-pressure chronic ulcer of other part of left foot limited to breakdown of skin: Secondary | ICD-10-CM

## 2018-07-28 DIAGNOSIS — Z09 Encounter for follow-up examination after completed treatment for conditions other than malignant neoplasm: Secondary | ICD-10-CM

## 2018-07-28 NOTE — Progress Notes (Signed)
Subjective:  Patient ID: [name], male    DOB: 1945-11-12,  MRN: [id number]  Chief Complaint  Patient presents with  . Routine Post Op     dos 11.05.2019 Amputation of Toe MPJ 2nd Lt " my toe feels funny"    DOS: 07/25/18 Procedure: L 2nd Toe Partial Amputation  72 y.o. male returns for post-op check. Doing well. Dressing came off Wednesday. Didn't like his surgical shoe and was given a new one.  Review of Systems: Negative except as noted in the HPI. Denies N/V/F/Ch.  Past Medical History:  Diagnosis Date  . Arthritis   . Cardiomyopathy- EF NL 01/2013, now 30-35% echo 03/14/2012  . Carotid artery occlusion    left s/p CEA  . CHF (congestive heart failure) ([place])   . Diabetes mellitus   . GERD (gastroesophageal reflux disease)   . HOH (hard of hearing)   . Hypertension   . Hypothyroidism   . Obstructive sleep apnea   . Orthostatic hypotension   . Osteomyelitis ([place])    left great toe  . Peripheral arterial disease ([place]), s/p PTA x 2 RLE    nonhealing ulcers bilaterally on each great toe  . Pneumonia   . PONV (postoperative nausea and vomiting)   . Restless leg syndrome   . Shortness of breath   . Stroke [facility]) March 08, 2012  . Wears dentures     Current Outpatient Medications:  .  amoxicillin-clavulanate (AUGMENTIN) 875-125 MG tablet, Take 1 tablet by mouth 2 (two) times daily for 7 days., Disp: 14 tablet, Rfl: 0 .  aspirin EC 81 MG EC tablet, Take 1 tablet (81 mg total) by mouth daily. (Patient taking differently: Take 81 mg by mouth at bedtime. ), Disp: , Rfl:  .  b complex vitamins tablet, Take 1 tablet by mouth daily., Disp: , Rfl:  .  carvedilol (COREG) 3.125 MG tablet, TAKE ONE TABLET BY MOUTH TWICE A DAY WITH A MEAL (Patient taking differently: Take 3.125 mg by mouth 2 (two) times daily with a meal. ), Disp: 180 tablet, Rfl: 3 .  Cholecalciferol (VITAMIN D3) 5000 units TABS, Take 5,000 Units by mouth daily., Disp: , Rfl:  .  clopidogrel (PLAVIX) 75 MG tablet,  Take 75 mg by mouth daily. , Disp: , Rfl:  .  Cyanocobalamin ([employer] B-12) 1500 MCG TBDP, Take 1,500 mcg by mouth daily. , Disp: , Rfl:  .  DULoxetine (CYMBALTA) 60 MG capsule, Take 60 mg by mouth at bedtime. , Disp: , Rfl:  .  Empagliflozin-linaGLIPtin (GLYXAMBI) 10-5 MG TABS, Take 1 tablet by mouth at bedtime., Disp: , Rfl:  .  furosemide (LASIX) 40 MG tablet, Take 1 tablet (40 mg total) by mouth 2 (two) times daily. Schedule appointment for refills (Patient taking differently: Take 40 mg by mouth daily. Schedule appointment for refills), Disp: 180 tablet, Rfl: 3 .  glimepiride (AMARYL) 4 MG tablet, Take 4 mg by mouth daily with breakfast., Disp: , Rfl:  .  HYDROcodone-acetaminophen (NORCO/VICODIN) 5-325 MG tablet, Take 1 tablet by mouth every 6 (six) hours as needed for moderate pain., Disp: 20 tablet, Rfl: 0 .  levothyroxine (SYNTHROID, LEVOTHROID) 75 MCG tablet, Take 75 mcg by mouth daily before breakfast. , Disp: , Rfl:  .  magnesium oxide (MAG-OX) 400 (241.3 Mg) MG tablet, Take 400 mg by mouth daily., Disp: , Rfl:  .  nitroGLYCERIN (NITROSTAT) 0.4 MG SL tablet, Place 1 tablet (0.4 mg total) under the tongue every 5 (five) minutes as needed  for chest pain., Disp: 25 tablet, Rfl: 3 .  potassium chloride SA (K-DUR,KLOR-CON) 20 MEQ tablet, Take 1 tablet (20 mEq total) by mouth daily. Please schedule appointment for refills (Patient taking differently: Take 20 mEq by mouth every other day. Please schedule appointment for refills), Disp: 90 tablet, Rfl: 3 .  pramipexole (MIRAPEX) 0.25 MG tablet, Take 2 pills at 7 PM and 2 pills at 9 PM daily. (Patient taking differently: Take 0.5 mg by mouth 2 (two) times daily. Take 0.5mg  by mouth at 7 PM and 0.5mg  at 9 PM daily.), Disp: 360 tablet, Rfl: 3 .  rosuvastatin (CRESTOR) 20 MG tablet, Take 20 mg by mouth daily., Disp: , Rfl:  .  tamsulosin (FLOMAX) 0.4 MG CAPS capsule, Take 0.4 mg by mouth daily. , Disp: , Rfl:  .  Wound Dressings (MEDIHONEY WOUND/BURN  DRESSING) GEL, Apply to affected are 3 times a week, and cover with sterile dressing. (Patient not taking: Reported on 07/24/2018), Disp: 1 Tube, Rfl: 5  Social History   Tobacco Use  Smoking Status Former Smoker  . Years: 1.00  . Types: Pipe  . Last attempt to quit: 07/08/1977  . Years since quitting: 41.0  Smokeless Tobacco Never Used    Allergies  Allergen Reactions  . Codeine Nausea And Vomiting  . Tramadol Nausea Only   Objective:   Vitals:   There is no height or weight on file to calculate BMI. Constitutional Well developed. Well nourished.  Vascular Foot warm and well perfused. Capillary refill normal to all digits.   Neurologic Normal speech. Oriented to person, place, and time. Epicritic sensation to light touch grossly present bilaterally.  Dermatologic Skin healing well without signs of infection. Skin edges well coapted without signs of infection.  Orthopedic: Tenderness to palpation noted about the surgical site.   Radiographs: None today Assessment:   1. Surgery follow-up   2. Diabetic ulcer of toe of left foot associated with type 2 diabetes mellitus, limited to breakdown of skin ([place])   3. Onychomadesis    Plan:  Patient was evaluated and treated and all questions answered.  S/p foot surgery left -Progressing as expected post-operatively. -XR: None -WB Status: WBAT in surgical shoe -Sutures: intact. -Medications: none refilled -Foot redressed.  Onychomadesis -Nail avulsed as below. -No tourniquet utilized.  Procedure: Avulsion of toenail Location: Left 2nd toenail  Anesthesia: Lidocaine 1% plain; 1.5 mL and Marcaine 0.5% plain; 1.5 mL, digital block. Skin Prep: Betadine. Dressing: Silvadene; telfa; dry, sterile, compression dressing. Technique: Following skin prep, the nail was freed and avulsed with a hemostat. The area was cleansed, lumicain and sterile dressing applied. Disposition: Patient tolerated procedure well.    Return in about  1 week (around 08/04/2018).

## 2018-07-30 LAB — AEROBIC/ANAEROBIC CULTURE W GRAM STAIN (SURGICAL/DEEP WOUND): Culture: NO GROWTH

## 2018-07-30 LAB — AEROBIC/ANAEROBIC CULTURE (SURGICAL/DEEP WOUND)

## 2018-08-01 NOTE — Progress Notes (Signed)
Patient is here today for follow-up appointment to remove bilateral Unna boots.  He states that the Unna boots hurt his legs and feet.  Removed Unna boots and noted reduction in bilateral lower extremity edema.  Due to patient's difficulty with ambulation the Unna boots get bunched up underneath his feet and cause pain, and had begun to exacerbate pre-ulcerative calluses.  Wound/ulcer to the left hallux toe appeared to be free from signs and symptoms of infection, there was no redness, no erythema, no purulent drainage.  The wound had pink healthy viable tissue.  Wound was cleansed with normal saline and a clean bandage was applied along with Iodosorb.  Due to complications with Unna boots Unna boot was not applied at this visit.  Patient is to follow-up after his surgery scheduled on 07/25/2018 or sooner if there are any acute symptom changes.

## 2018-08-01 NOTE — Progress Notes (Addendum)
Patient is here today for follow-up appointment, he recently had surgery amputation of toe interphalangeal hallux left, performed on 07/25/2018.  He is here today because he said that his bandages coming off and has been difficult for him to re-bandage it.  Remove soiled bandage, noted well-healing surgical incision.  No redness, no erythema, no drainage, Mild swelling within normal limits for postoperative status.  All sutures were intact, with no gapping.  Applied Betadine with dry sterile dressing.  Advised patient to keep his foot dry, we also dispensed a surgical shoe because the one that they fitted him for in the hospital was causing him to lose his balance I would not stay in place.  He is to keep his follow-up appointment with Dr. March Rummage this Friday or come in if any acute symptoms arise

## 2018-08-01 NOTE — Addendum Note (Signed)
Addended by: Roney Jaffe on: 08/01/2018 10:46 AM   Modules accepted: Level of Service

## 2018-08-03 ENCOUNTER — Ambulatory Visit (INDEPENDENT_AMBULATORY_CARE_PROVIDER_SITE_OTHER): Payer: PPO | Admitting: Podiatry

## 2018-08-03 DIAGNOSIS — Z09 Encounter for follow-up examination after completed treatment for conditions other than malignant neoplasm: Secondary | ICD-10-CM

## 2018-08-03 MED ORDER — CLINDAMYCIN HCL 300 MG PO CAPS: 300.0000 mg | ORAL_CAPSULE | Freq: Two times a day (BID) | ORAL | 0 refills | Status: DC

## 2018-08-11 ENCOUNTER — Ambulatory Visit (INDEPENDENT_AMBULATORY_CARE_PROVIDER_SITE_OTHER): Payer: PPO | Admitting: Podiatry

## 2018-08-11 ENCOUNTER — Encounter: Payer: Self-pay | Admitting: Podiatry

## 2018-08-11 DIAGNOSIS — D689 Coagulation defect, unspecified: Secondary | ICD-10-CM

## 2018-08-11 DIAGNOSIS — M2042 Other hammer toe(s) (acquired), left foot: Secondary | ICD-10-CM

## 2018-08-11 DIAGNOSIS — I739 Peripheral vascular disease, unspecified: Secondary | ICD-10-CM

## 2018-08-11 DIAGNOSIS — Z09 Encounter for follow-up examination after completed treatment for conditions other than malignant neoplasm: Secondary | ICD-10-CM

## 2018-08-11 NOTE — Progress Notes (Signed)
Subjective:  Patient ID: [name], male    DOB: 1945/11/08,  MRN: [id number]  Chief Complaint  Patient presents with  . Routine Post Op    #3 dos 11.05.2019 Amputation of Toe MPJ 2nd Lt -Dr. March Rummage Added per Dr. March Rummage (no nurse schedule/no other spots avail)   DOS: 07/25/18 Procedure: L 2nd Toe Partial Amputation  72 y.o. male returns for post-op check.  Doing very well pleased with the results of surgery ready to get out of a surgical shoe.  Review of Systems: Negative except as noted in the HPI. Denies N/V/F/Ch.  Past Medical History:  Diagnosis Date  . Arthritis   . Cardiomyopathy- EF NL 01/2013, now 30-35% echo 03/14/2012  . Carotid artery occlusion    left s/p CEA  . CHF (congestive heart failure) ([place])   . Diabetes mellitus   . GERD (gastroesophageal reflux disease)   . HOH (hard of hearing)   . Hypertension   . Hypothyroidism   . Obstructive sleep apnea   . Orthostatic hypotension   . Osteomyelitis ([place])    left great toe  . Peripheral arterial disease ([place]), s/p PTA x 2 RLE    nonhealing ulcers bilaterally on each great toe  . Pneumonia   . PONV (postoperative nausea and vomiting)   . Restless leg syndrome   . Shortness of breath   . Stroke [facility]) March 08, 2012  . Wears dentures     Current Outpatient Medications:  .  aspirin EC 81 MG EC tablet, Take 1 tablet (81 mg total) by mouth daily. (Patient taking differently: Take 81 mg by mouth at bedtime. ), Disp: , Rfl:  .  b complex vitamins tablet, Take 1 tablet by mouth daily., Disp: , Rfl:  .  carvedilol (COREG) 3.125 MG tablet, TAKE ONE TABLET BY MOUTH TWICE A DAY WITH A MEAL (Patient taking differently: Take 3.125 mg by mouth 2 (two) times daily with a meal. ), Disp: 180 tablet, Rfl: 3 .  Cholecalciferol (VITAMIN D3) 5000 units TABS, Take 5,000 Units by mouth daily., Disp: , Rfl:  .  clindamycin (CLEOCIN) 300 MG capsule, Take 1 capsule (300 mg total) by mouth 2 (two) times daily., Disp: 14 capsule, Rfl: 0 .   clopidogrel (PLAVIX) 75 MG tablet, Take 75 mg by mouth daily. , Disp: , Rfl:  .  Cyanocobalamin ([employer] B-12) 1500 MCG TBDP, Take 1,500 mcg by mouth daily. , Disp: , Rfl:  .  DULoxetine (CYMBALTA) 60 MG capsule, Take 60 mg by mouth at bedtime. , Disp: , Rfl:  .  Empagliflozin-linaGLIPtin (GLYXAMBI) 10-5 MG TABS, Take 1 tablet by mouth at bedtime., Disp: , Rfl:  .  furosemide (LASIX) 40 MG tablet, Take 1 tablet (40 mg total) by mouth 2 (two) times daily. Schedule appointment for refills (Patient taking differently: Take 40 mg by mouth daily. Schedule appointment for refills), Disp: 180 tablet, Rfl: 3 .  glimepiride (AMARYL) 4 MG tablet, Take 4 mg by mouth daily with breakfast., Disp: , Rfl:  .  HYDROcodone-acetaminophen (NORCO/VICODIN) 5-325 MG tablet, Take 1 tablet by mouth every 6 (six) hours as needed for moderate pain., Disp: 20 tablet, Rfl: 0 .  levothyroxine (SYNTHROID, LEVOTHROID) 75 MCG tablet, Take 75 mcg by mouth daily before breakfast. , Disp: , Rfl:  .  magnesium oxide (MAG-OX) 400 (241.3 Mg) MG tablet, Take 400 mg by mouth daily., Disp: , Rfl:  .  nitroGLYCERIN (NITROSTAT) 0.4 MG SL tablet, Place 1 tablet (0.4 mg total) under the  tongue every 5 (five) minutes as needed for chest pain., Disp: 25 tablet, Rfl: 3 .  potassium chloride SA (K-DUR,KLOR-CON) 20 MEQ tablet, Take 1 tablet (20 mEq total) by mouth daily. Please schedule appointment for refills (Patient taking differently: Take 20 mEq by mouth every other day. Please schedule appointment for refills), Disp: 90 tablet, Rfl: 3 .  pramipexole (MIRAPEX) 0.25 MG tablet, Take 2 pills at 7 PM and 2 pills at 9 PM daily. (Patient taking differently: Take 0.5 mg by mouth 2 (two) times daily. Take 0.5mg  by mouth at 7 PM and 0.5mg  at 9 PM daily.), Disp: 360 tablet, Rfl: 3 .  rosuvastatin (CRESTOR) 20 MG tablet, Take 20 mg by mouth daily., Disp: , Rfl:  .  tamsulosin (FLOMAX) 0.4 MG CAPS capsule, Take 0.4 mg by mouth daily. , Disp: , Rfl:  .  Wound  Dressings (MEDIHONEY WOUND/BURN DRESSING) GEL, Apply to affected are 3 times a week, and cover with sterile dressing. (Patient not taking: Reported on 07/24/2018), Disp: 1 Tube, Rfl: 5  Social History   Tobacco Use  Smoking Status Former Smoker  . Years: 1.00  . Types: Pipe  . Last attempt to quit: 07/08/1977  . Years since quitting: 41.1  Smokeless Tobacco Never Used    Allergies  Allergen Reactions  . Codeine Nausea And Vomiting  . Tramadol Nausea Only   Objective:   There were no vitals filed for this visit. There is no height or weight on file to calculate BMI. Constitutional Well developed. Well nourished.  Vascular Foot warm and well perfused. Capillary refill normal to all digits.   Neurologic Normal speech. Oriented to person, place, and time. Epicritic sensation to light touch grossly present bilaterally.  Dermatologic Skin healing well without signs of infection. Skin edges well coapted without signs of infection.  Orthopedic: Tenderness to palpation noted about the surgical site. Left second hammertoe with pre-ulcerative distal callus semi-reducible Left third toe hammertoe semi-reducible   Radiographs: None today Assessment:   1. Surgery follow-up   2. Hammer toe of left foot   3. Peripheral arterial disease (HCC)   4. Coagulation defect ([place])    Plan:  Patient was evaluated and treated and all questions answered.  S/p foot surgery left -Progressing as expected post-operatively. -XR: None -WB Status: WBAT in surgical shoe.  Continue for 2 more weeks. -Sutures: Every other suture removed. -Medications: none refilled -Foot redressed.  Left second toe hammertoe, left third toe hammertoe; pre-ulcerative callus left second toe -Discussed the patient benefit of performing flexor tenotomy procedure.  Patient verbalized understanding.  We will plan for procedure next visit.  Discussed procedure in detail discussed risk benefits and alternatives.  Both are  semi-reducible and should improve with flexion tenotomy however should these not improve would consider possible hammertoe correction at a later date  No follow-ups on file.

## 2018-08-14 NOTE — Progress Notes (Signed)
Subjective:  Patient ID: [name], male    DOB: [birth year],  MRN: [id number]  Chief Complaint  Patient presents with  . Routine Post Op    " my foot feels fine"    DOS: 07/25/18 Procedure: L 2nd Toe Partial Amputation  72 y.o. male returns for post-op check. Doing well. Denies complaints.  Review of Systems: Negative except as noted in the HPI. Denies N/V/F/Ch.  Past Medical History:  Diagnosis Date  . Arthritis   . Cardiomyopathy- EF NL 01/2013, now 30-35% echo 03/14/2012  . Carotid artery occlusion    left s/p CEA  . CHF (congestive heart failure) ([place])   . Diabetes mellitus   . GERD (gastroesophageal reflux disease)   . HOH (hard of hearing)   . Hypertension   . Hypothyroidism   . Obstructive sleep apnea   . Orthostatic hypotension   . Osteomyelitis ([place])    left great toe  . Peripheral arterial disease ([place]), s/p PTA x 2 RLE    nonhealing ulcers bilaterally on each great toe  . Pneumonia   . PONV (postoperative nausea and vomiting)   . Restless leg syndrome   . Shortness of breath   . Stroke [facility]) March 08, 2012  . Wears dentures     Current Outpatient Medications:  .  aspirin EC 81 MG EC tablet, Take 1 tablet (81 mg total) by mouth daily. (Patient taking differently: Take 81 mg by mouth at bedtime. ), Disp: , Rfl:  .  b complex vitamins tablet, Take 1 tablet by mouth daily., Disp: , Rfl:  .  carvedilol (COREG) 3.125 MG tablet, TAKE ONE TABLET BY MOUTH TWICE A DAY WITH A MEAL (Patient taking differently: Take 3.125 mg by mouth 2 (two) times daily with a meal. ), Disp: 180 tablet, Rfl: 3 .  Cholecalciferol (VITAMIN D3) 5000 units TABS, Take 5,000 Units by mouth daily., Disp: , Rfl:  .  clindamycin (CLEOCIN) 300 MG capsule, Take 1 capsule (300 mg total) by mouth 2 (two) times daily., Disp: 14 capsule, Rfl: 0 .  clopidogrel (PLAVIX) 75 MG tablet, Take 75 mg by mouth daily. , Disp: , Rfl:  .  Cyanocobalamin (CVS B-12) 1500 MCG TBDP, Take 1,500 mcg by mouth daily. ,  Disp: , Rfl:  .  DULoxetine (CYMBALTA) 60 MG capsule, Take 60 mg by mouth at bedtime. , Disp: , Rfl:  .  Empagliflozin-linaGLIPtin (GLYXAMBI) 10-5 MG TABS, Take 1 tablet by mouth at bedtime., Disp: , Rfl:  .  furosemide (LASIX) 40 MG tablet, Take 1 tablet (40 mg total) by mouth 2 (two) times daily. Schedule appointment for refills (Patient taking differently: Take 40 mg by mouth daily. Schedule appointment for refills), Disp: 180 tablet, Rfl: 3 .  glimepiride (AMARYL) 4 MG tablet, Take 4 mg by mouth daily with breakfast., Disp: , Rfl:  .  HYDROcodone-acetaminophen (NORCO/VICODIN) 5-325 MG tablet, Take 1 tablet by mouth every 6 (six) hours as needed for moderate pain., Disp: 20 tablet, Rfl: 0 .  levothyroxine (SYNTHROID, LEVOTHROID) 75 MCG tablet, Take 75 mcg by mouth daily before breakfast. , Disp: , Rfl:  .  magnesium oxide (MAG-OX) 400 (241.3 Mg) MG tablet, Take 400 mg by mouth daily., Disp: , Rfl:  .  nitroGLYCERIN (NITROSTAT) 0.4 MG SL tablet, Place 1 tablet (0.4 mg total) under the tongue every 5 (five) minutes as needed for chest pain., Disp: 25 tablet, Rfl: 3 .  potassium chloride SA (K-DUR,KLOR-CON) 20 MEQ tablet, Take 1 tablet (20 mEq  total) by mouth daily. Please schedule appointment for refills (Patient taking differently: Take 20 mEq by mouth every other day. Please schedule appointment for refills), Disp: 90 tablet, Rfl: 3 .  pramipexole (MIRAPEX) 0.25 MG tablet, Take 2 pills at 7 PM and 2 pills at 9 PM daily. (Patient taking differently: Take 0.5 mg by mouth 2 (two) times daily. Take 0.5mg  by mouth at 7 PM and 0.5mg  at 9 PM daily.), Disp: 360 tablet, Rfl: 3 .  rosuvastatin (CRESTOR) 20 MG tablet, Take 20 mg by mouth daily., Disp: , Rfl:  .  tamsulosin (FLOMAX) 0.4 MG CAPS capsule, Take 0.4 mg by mouth daily. , Disp: , Rfl:  .  Wound Dressings (MEDIHONEY WOUND/BURN DRESSING) GEL, Apply to affected are 3 times a week, and cover with sterile dressing. (Patient not taking: Reported on  07/24/2018), Disp: 1 Tube, Rfl: 5  Social History   Tobacco Use  Smoking Status Former Smoker  . Years: 1.00  . Types: Pipe  . Last attempt to quit: 07/08/1977  . Years since quitting: 41.1  Smokeless Tobacco Never Used    Allergies  Allergen Reactions  . Codeine Nausea And Vomiting  . Tramadol Nausea Only   Objective:   Vitals:   There is no height or weight on file to calculate BMI. Constitutional Well developed. Well nourished.  Vascular Foot warm and well perfused. Capillary refill normal to all digits.   Neurologic Normal speech. Oriented to person, place, and time. Epicritic sensation to light touch grossly present bilaterally.  Dermatologic Skin healing well without signs of infection. Skin edges well coapted without signs of infection. Slight periwound erythema, likely post-surgical.  Orthopedic: Tenderness to palpation noted about the surgical site.   Radiographs: None today Assessment:   1. Surgery follow-up    Plan:  Patient was evaluated and treated and all questions answered.  S/p foot surgery left -Progressing as expected post-operatively. -XR: None -WB Status: WBAT in surgical shoe -Sutures: intact. -Medications: Refill clinda -Foot redressed.  Onychomadesis -Nail healing well.   Return in about 1 week (around 08/10/2018) for Post-op.

## 2018-08-16 ENCOUNTER — Ambulatory Visit (INDEPENDENT_AMBULATORY_CARE_PROVIDER_SITE_OTHER): Payer: PPO | Admitting: Podiatry

## 2018-08-16 ENCOUNTER — Other Ambulatory Visit: Payer: Self-pay

## 2018-08-16 DIAGNOSIS — M2042 Other hammer toe(s) (acquired), left foot: Secondary | ICD-10-CM | POA: Diagnosis not present

## 2018-08-16 DIAGNOSIS — Z09 Encounter for follow-up examination after completed treatment for conditions other than malignant neoplasm: Secondary | ICD-10-CM

## 2018-08-16 MED ORDER — CLINDAMYCIN HCL 300 MG PO CAPS: 300.0000 mg | ORAL_CAPSULE | Freq: Two times a day (BID) | ORAL | 0 refills | Status: DC

## 2018-08-16 NOTE — Patient Outreach (Signed)
[facility]) Care Management  08/16/2018  [name] Sep 09, 1946 [id number]    1st unsuccessful telephone outreach to the patient for assessment. No answer. HIPAA compliant voicemail left with contact information.  Plan: [place] will make outreach attempt the patient within in one month.   [name] RN, BSN, [place] Direct Dial:  [phone]  Fax: [phone]

## 2018-08-16 NOTE — Progress Notes (Signed)
  Subjective:  Patient ID: [name], male    DOB: 03-08-1946,  MRN: [id number]  Chief Complaint  Patient presents with  . Routine Post Op    left great toe amputation - possible suture/staple removal today   72 y.o. male returns today for planned flexor tenotomy of the left second digit.  Objective:  There were no vitals filed for this visit.  General AA&O x3. Normal mood and affect.  Vascular Pedal pulses palpable.  Neurologic Epicritic sensation grossly intact.  Dermatologic Pre-ulcerative callus at the tip of the left, 2nd toe  Orthopedic: Semi-reducible hammertoe deformity left, 2nd toe    Assessment & Plan:  Patient was evaluated and treated and all questions answered.  Hammertoe L 2nd Toe with pre-ulcerative callus -Flexor tenotomy as below. -Advised to remove the dressing in 24 hours and apply a band-aid and triple abx ointment every day thereafter.  Procedure: Flexor Tenotomy Indication for Procedure: toe with semi-reducible hammertoe with distal tip ulceration. Flexor tenotomy indicated to alleviate contracture, reduce pressure, and enhance healing of the ulceration. Location: left, 2nd toe Anesthesia: Lidocaine 1% plain; 1.5 mL and Marcaine 0.5% plain; 1.5 mL digital block Instrumentation: 18 g needle  Technique: The toe was anesthetized as above and prepped in the usual fashion. The toe was exsanquinated and a tourniquet was secured at the base of the toe. An 18g needle was then used to percutaneously release the flexor tendon at the plantar surface of the toe with noted release of the hammertoe deformity. The incision was then dressed with antibiotic ointment and band-aid. Compression splint dressing applied. Patient tolerated the procedure well. Dressing: Dry, sterile, compression dressing. Disposition: Patient tolerated procedure well. Patient to return in 1 week for follow-up.      No follow-ups on file.

## 2018-08-16 NOTE — Progress Notes (Signed)
Subjective:  Patient ID: [name], male    DOB: 1946-07-09,  MRN: [id number]  Chief Complaint  Patient presents with  . Routine Post Op    left great toe amputation - possible suture/staple removal today   DOS: 07/25/18 Procedure: L 2nd Toe Partial Amputation  72 y.o. male returns for post-op check. Doing very well also here for planned flexor tenotomy.  Review of Systems: Negative except as noted in the HPI. Denies N/V/F/Ch.  Past Medical History:  Diagnosis Date  . Arthritis   . Cardiomyopathy- EF NL 01/2013, now 30-35% echo 03/14/2012  . Carotid artery occlusion    left s/p CEA  . CHF (congestive heart failure) ([place])   . Diabetes mellitus   . GERD (gastroesophageal reflux disease)   . HOH (hard of hearing)   . Hypertension   . Hypothyroidism   . Obstructive sleep apnea   . Orthostatic hypotension   . Osteomyelitis ([place])    left great toe  . Peripheral arterial disease ([place]), s/p PTA x 2 RLE    nonhealing ulcers bilaterally on each great toe  . Pneumonia   . PONV (postoperative nausea and vomiting)   . Restless leg syndrome   . Shortness of breath   . Stroke [facility]) March 08, 2012  . Wears dentures     Current Outpatient Medications:  .  aspirin EC 81 MG EC tablet, Take 1 tablet (81 mg total) by mouth daily. (Patient taking differently: Take 81 mg by mouth at bedtime. ), Disp: , Rfl:  .  b complex vitamins tablet, Take 1 tablet by mouth daily., Disp: , Rfl:  .  carvedilol (COREG) 3.125 MG tablet, TAKE ONE TABLET BY MOUTH TWICE A DAY WITH A MEAL (Patient taking differently: Take 3.125 mg by mouth 2 (two) times daily with a meal. ), Disp: 180 tablet, Rfl: 3 .  Cholecalciferol (VITAMIN D3) 5000 units TABS, Take 5,000 Units by mouth daily., Disp: , Rfl:  .  clindamycin (CLEOCIN) 300 MG capsule, Take 1 capsule (300 mg total) by mouth 2 (two) times daily., Disp: 14 capsule, Rfl: 0 .  clopidogrel (PLAVIX) 75 MG tablet, Take 75 mg by mouth daily. , Disp: , Rfl:  .   Cyanocobalamin ([employer]S B-12) 1500 MCG TBDP, Take 1,500 mcg by mouth daily. , Disp: , Rfl:  .  DULoxetine (CYMBALTA) 60 MG capsule, Take 60 mg by mouth at bedtime. , Disp: , Rfl:  .  Empagliflozin-linaGLIPtin (GLYXAMBI) 10-5 MG TABS, Take 1 tablet by mouth at bedtime., Disp: , Rfl:  .  furosemide (LASIX) 40 MG tablet, Take 1 tablet (40 mg total) by mouth 2 (two) times daily. Schedule appointment for refills (Patient taking differently: Take 40 mg by mouth daily. Schedule appointment for refills), Disp: 180 tablet, Rfl: 3 .  glimepiride (AMARYL) 4 MG tablet, Take 4 mg by mouth daily with breakfast., Disp: , Rfl:  .  HYDROcodone-acetaminophen (NORCO/VICODIN) 5-325 MG tablet, Take 1 tablet by mouth every 6 (six) hours as needed for moderate pain., Disp: 20 tablet, Rfl: 0 .  levothyroxine (SYNTHROID, LEVOTHROID) 75 MCG tablet, Take 75 mcg by mouth daily before breakfast. , Disp: , Rfl:  .  magnesium oxide (MAG-OX) 400 (241.3 Mg) MG tablet, Take 400 mg by mouth daily., Disp: , Rfl:  .  nitroGLYCERIN (NITROSTAT) 0.4 MG SL tablet, Place 1 tablet (0.4 mg total) under the tongue every 5 (five) minutes as needed for chest pain., Disp: 25 tablet, Rfl: 3 .  potassium chloride SA (K-DUR,KLOR-CON)  20 MEQ tablet, Take 1 tablet (20 mEq total) by mouth daily. Please schedule appointment for refills (Patient taking differently: Take 20 mEq by mouth every other day. Please schedule appointment for refills), Disp: 90 tablet, Rfl: 3 .  pramipexole (MIRAPEX) 0.25 MG tablet, Take 2 pills at 7 PM and 2 pills at 9 PM daily. (Patient taking differently: Take 0.5 mg by mouth 2 (two) times daily. Take 0.5mg  by mouth at 7 PM and 0.5mg  at 9 PM daily.), Disp: 360 tablet, Rfl: 3 .  rosuvastatin (CRESTOR) 20 MG tablet, Take 20 mg by mouth daily., Disp: , Rfl:  .  tamsulosin (FLOMAX) 0.4 MG CAPS capsule, Take 0.4 mg by mouth daily. , Disp: , Rfl:  .  Wound Dressings (MEDIHONEY WOUND/BURN DRESSING) GEL, Apply to affected are 3 times a week,  and cover with sterile dressing. (Patient not taking: Reported on 07/24/2018), Disp: 1 Tube, Rfl: 5  Social History   Tobacco Use  Smoking Status Former Smoker  . Years: 1.00  . Types: Pipe  . Last attempt to quit: 07/08/1977  . Years since quitting: 41.1  Smokeless Tobacco Never Used    Allergies  Allergen Reactions  . Codeine Nausea And Vomiting  . Tramadol Nausea Only   Objective:   There were no vitals filed for this visit. There is no height or weight on file to calculate BMI. Constitutional Well developed. Well nourished.  Vascular Foot warm and well perfused. Capillary refill normal to all digits.   Neurologic Normal speech. Oriented to person, place, and time. Epicritic sensation to light touch grossly present bilaterally.  Dermatologic Skin healing well without signs of infection. Skin edges well coapted without signs of infection.  Orthopedic: Tenderness to palpation noted about the surgical site. Left second hammertoe with pre-ulcerative distal callus semi-reducible Left third toe hammertoe semi-reducible   Radiographs: None today Assessment:   1. Surgery follow-up    Plan:  Patient was evaluated and treated and all questions answered.  S/p foot surgery left -Progressing as expected post-operatively. -XR: None -WB Status: WBAT in surgical shoe.  Continue for 1 more weeks. -Sutures: Every other suture removed. -Medications: none refilled -Foot redressed.  Left second toe hammertoe, left third toe hammertoe; pre-ulcerative callus left second toe -Flexor tenotomy performed. See separate procedure note.  F/u next week for staple removal.  No follow-ups on file.

## 2018-08-18 ENCOUNTER — Ambulatory Visit: Payer: Self-pay

## 2018-08-24 ENCOUNTER — Ambulatory Visit (INDEPENDENT_AMBULATORY_CARE_PROVIDER_SITE_OTHER): Payer: PPO | Admitting: Podiatry

## 2018-08-24 ENCOUNTER — Encounter: Payer: Self-pay | Admitting: Podiatry

## 2018-08-24 DIAGNOSIS — Z09 Encounter for follow-up examination after completed treatment for conditions other than malignant neoplasm: Secondary | ICD-10-CM

## 2018-08-28 ENCOUNTER — Other Ambulatory Visit: Payer: Self-pay | Admitting: Cardiovascular Disease

## 2018-08-31 ENCOUNTER — Ambulatory Visit (INDEPENDENT_AMBULATORY_CARE_PROVIDER_SITE_OTHER): Payer: PPO | Admitting: Podiatry

## 2018-08-31 ENCOUNTER — Encounter: Payer: Self-pay | Admitting: Podiatry

## 2018-08-31 DIAGNOSIS — Z09 Encounter for follow-up examination after completed treatment for conditions other than malignant neoplasm: Secondary | ICD-10-CM

## 2018-09-06 ENCOUNTER — Ambulatory Visit (INDEPENDENT_AMBULATORY_CARE_PROVIDER_SITE_OTHER): Payer: PPO | Admitting: Podiatry

## 2018-09-06 ENCOUNTER — Ambulatory Visit (INDEPENDENT_AMBULATORY_CARE_PROVIDER_SITE_OTHER): Payer: PPO

## 2018-09-06 ENCOUNTER — Encounter: Payer: Self-pay | Admitting: Podiatry

## 2018-09-06 DIAGNOSIS — E1142 Type 2 diabetes mellitus with diabetic polyneuropathy: Secondary | ICD-10-CM

## 2018-09-06 DIAGNOSIS — E13621 Other specified diabetes mellitus with foot ulcer: Secondary | ICD-10-CM

## 2018-09-06 DIAGNOSIS — L97519 Non-pressure chronic ulcer of other part of right foot with unspecified severity: Secondary | ICD-10-CM

## 2018-09-06 DIAGNOSIS — L97509 Non-pressure chronic ulcer of other part of unspecified foot with unspecified severity: Secondary | ICD-10-CM

## 2018-09-06 DIAGNOSIS — E11621 Type 2 diabetes mellitus with foot ulcer: Secondary | ICD-10-CM

## 2018-09-06 DIAGNOSIS — L97515 Non-pressure chronic ulcer of other part of right foot with muscle involvement without evidence of necrosis: Secondary | ICD-10-CM | POA: Diagnosis not present

## 2018-09-06 MED ORDER — CLINDAMYCIN HCL 300 MG PO CAPS: 300.0000 mg | ORAL_CAPSULE | Freq: Three times a day (TID) | ORAL | 0 refills | Status: AC

## 2018-09-06 NOTE — Patient Instructions (Addendum)
Instructions for Wound Care  The most important step to healing a foot wound is to reduce the pressure on your foot - it is extremely important to stay off your foot as much as possible and wear the shoe/boot as instructed.  Cleanse your foot with saline wash.  Blot dry.  Apply prescribed medication to your wound and cover with gauze and a bandage.   Your prescribed topical medication is :  [employer]  [employer] is a mail order medical supply company that we use to provide some of our would care products.  If we use their service of you, you will receive the product by mail.  If you have not received the medication in 3 business days, please call our office.  If you notice any foul odor, increase in pain, pus, increased swelling, red streaks or generalized redness occurring in your foot or leg-Call our office immediately to be seen.  This may be a sign of a limb or life threatening infection that will need prompt attention.  Acquanetta Sit, [place]  [place]

## 2018-09-06 NOTE — Progress Notes (Addendum)
Subjective:   [name] presents today with cc of painful right foot. Patient states foot has been bothering him for the past week or two. He has remote h/o of partial hallux amputation of left hallux which has healed. He has also had a flexor tenotomy performed of the left 2nd digit for preulcerative corn, both performed by Dr. March Rummage.      [name] denies noticing any drainage coming from his right foot. He denies any fever, chills, nightsweats, nausea or vomiting.   Medications    aspirin EC 81 MG EC tablet    b complex vitamins tablet    carvedilol (COREG) 3.125 MG tablet    Cholecalciferol (VITAMIN D3) 5000 units TABS    clopidogrel (PLAVIX) 75 MG tablet    Cyanocobalamin ([employer] B-12) 1500 MCG TBDP    DULoxetine (CYMBALTA) 60 MG capsule    Empagliflozin-linaGLIPtin (GLYXAMBI) 10-5 MG TABS    furosemide (LASIX) 40 MG tablet    glimepiride (AMARYL) 4 MG tablet    HYDROcodone-acetaminophen (NORCO/VICODIN) 5-325 MG tablet    levothyroxine (SYNTHROID, LEVOTHROID) 75 MCG tablet    magnesium oxide (MAG-OX) 400 (241.3 Mg) MG tablet    nitroGLYCERIN (NITROSTAT) 0.4 MG SL tablet    potassium chloride SA (K-DUR,KLOR-CON) 20 MEQ tablet    pramipexole (MIRAPEX) 0.25 MG tablet    rosuvastatin (CRESTOR) 20 MG tablet    tamsulosin (FLOMAX) 0.4 MG CAPS capsule     Allergies  Allergen Reactions  . Codeine Nausea And Vomiting  . Tramadol Nausea Only    Objective:   Vascular Examination: Capillary refill time immediate <3 seconds x  digits Dorsalis pedis pulses palpable b/l Posterior tibial pulses faintly palpable b/l Digital hair x 10 digits absent Skin temperature gradient WNL b/l  Dermatological Examination:    Ulceration located submetatarsal head 1 right foot.  Measurements carried out today of 3.0 x 3.0 predebridement with hyperkeratotic roof with visible subdermal hemorrhage and flocculence noted from 12 o'clock to one o'clock position.  No erythema, no edema, no  lymphangitis noted.  No probing to bone, no undermining, no active purulence noted.  No deep space abscess, no erythema, no edema, no cellulitis, no odor was encountered.    Postdebridement, ulcer is full thickness and measures 2.0 x 2.5 x 0.2 cm with red, granular base.  No erythema, no edema, no lymphangitis noted.  No probing to bone, no undermining, no active purulence noted.  No deep space abscess, no erythema, no edema, no cellulitis, no odor was encountered.   Xray right foot: He does have hardware, crossed screws which appear to be be stable. On lateral view, there appears to be a sharp bone spicule noted plantar aspect of proximal phalanx.  Assessment:   1.  Diabetic Ulceration right foot submetatarsal head 1 2.    NIDDM with peripheral neuropathy  Plan: 1. Ulcer was debrided and reactive hyperkeratoses and necrotic tissue was resected to the level of bleeding or viable tissue submetatarsal head 1 right foot. Ulcer was cleansed with wound cleanser. Iodosorb Gel was applied to base of wound with light dressing. Patient was given dressing supplies of Medihoney, sterile gauze and sterile qtips. Will mail supplies to him from [facility]. 2. Xray performed right foot. 3. Prescription sent to pharmacy for Clindamycin 300 mg po tid for 10 days. 4. [name] has a surgical shoe at home and will wear it on his right foot daily. 5. Patient was given instructions on daily dressing changes,  offloading and  dressing change/aftercare and was instructed to call immediately if any signs or symptoms of infection arise.  6. Patient is to follow up in one week and will see Dr. March Rummage. 7. Patient instructed to report to emergency department with worsening appearance of ulcer/toe/foot, increased pain, foul odor, increased redness, swelling, drainage, fever, chills, nightsweats, nausea, vomiting, increased blood sugar. Patient/POA related understanding.

## 2018-09-07 ENCOUNTER — Telehealth: Payer: Self-pay | Admitting: *Deleted

## 2018-09-07 NOTE — Telephone Encounter (Signed)
Dr. Elisha Ponder ordered sterile 4 x4 gauze, Medihoney, gloves, saline, and sterile cotton tip applicators for right foot wound 1st MPJ E11.621, L97.515 measuring 2.0 x 2.5 x 0.2cm without exudate, faxed to [facility].

## 2018-09-14 ENCOUNTER — Ambulatory Visit (INDEPENDENT_AMBULATORY_CARE_PROVIDER_SITE_OTHER): Payer: PPO | Admitting: Podiatry

## 2018-09-14 DIAGNOSIS — E1142 Type 2 diabetes mellitus with diabetic polyneuropathy: Secondary | ICD-10-CM

## 2018-09-14 DIAGNOSIS — E08621 Diabetes mellitus due to underlying condition with foot ulcer: Secondary | ICD-10-CM | POA: Diagnosis not present

## 2018-09-14 DIAGNOSIS — L97415 Non-pressure chronic ulcer of right heel and midfoot with muscle involvement without evidence of necrosis: Secondary | ICD-10-CM

## 2018-09-14 NOTE — Progress Notes (Signed)
Subjective:   Mr. BURT PIATEK presents today for follow up of diabetic ulceration of the right  foot. Patient states foot has improved since last visit. He has been keeping it dry with exception of yesterday when a hole got in his plastic bag. He relates drying the wound and cleaning it immediately after the incident.    Mr. Ausborn denies any fever, chills, nightsweats, nausea or vomiting.   Current Outpatient Medications:  .  aspirin EC 81 MG EC tablet, Take 1 tablet (81 mg total) by mouth daily. (Patient taking differently: Take 81 mg by mouth at bedtime. ), Disp: , Rfl:  .  b complex vitamins tablet, Take 1 tablet by mouth daily., Disp: , Rfl:  .  carvedilol (COREG) 3.125 MG tablet, TAKE ONE TABLET BY MOUTH TWICE A DAY WITH A MEAL, Disp: 180 tablet, Rfl: 3 .  Cholecalciferol (VITAMIN D3) 5000 units TABS, Take 5,000 Units by mouth daily., Disp: , Rfl:  .  clindamycin (CLEOCIN) 300 MG capsule, Take 1 capsule (300 mg total) by mouth 3 (three) times daily for 10 days., Disp: 30 capsule, Rfl: 0 .  clopidogrel (PLAVIX) 75 MG tablet, Take 75 mg by mouth daily. , Disp: , Rfl:  .  Cyanocobalamin (CVS B-12) 1500 MCG TBDP, Take 1,500 mcg by mouth daily. , Disp: , Rfl:  .  DULoxetine (CYMBALTA) 60 MG capsule, Take 60 mg by mouth at bedtime. , Disp: , Rfl:  .  Empagliflozin-linaGLIPtin (GLYXAMBI) 10-5 MG TABS, Take 1 tablet by mouth at bedtime., Disp: , Rfl:  .  furosemide (LASIX) 40 MG tablet, Take 1 tablet (40 mg total) by mouth 2 (two) times daily. Schedule appointment for refills (Patient taking differently: Take 40 mg by mouth daily. Schedule appointment for refills), Disp: 180 tablet, Rfl: 3 .  glimepiride (AMARYL) 4 MG tablet, Take 4 mg by mouth daily with breakfast., Disp: , Rfl:  .  HYDROcodone-acetaminophen (NORCO/VICODIN) 5-325 MG tablet, Take 1 tablet by mouth every 6 (six) hours as needed for moderate pain., Disp: 20 tablet, Rfl: 0 .  levothyroxine (SYNTHROID, LEVOTHROID) 75 MCG tablet,  Take 75 mcg by mouth daily before breakfast. , Disp: , Rfl:  .  magnesium oxide (MAG-OX) 400 (241.3 Mg) MG tablet, Take 400 mg by mouth daily., Disp: , Rfl:  .  nitroGLYCERIN (NITROSTAT) 0.4 MG SL tablet, Place 1 tablet (0.4 mg total) under the tongue every 5 (five) minutes as needed for chest pain., Disp: 25 tablet, Rfl: 3 .  potassium chloride SA (K-DUR,KLOR-CON) 20 MEQ tablet, Take 1 tablet (20 mEq total) by mouth daily. Please schedule appointment for refills (Patient taking differently: Take 20 mEq by mouth every other day. Please schedule appointment for refills), Disp: 90 tablet, Rfl: 3 .  pramipexole (MIRAPEX) 0.25 MG tablet, Take 2 pills at 7 PM and 2 pills at 9 PM daily. (Patient taking differently: Take 0.5 mg by mouth 2 (two) times daily. Take 0.5mg  by mouth at 7 PM and 0.5mg  at 9 PM daily.), Disp: 360 tablet, Rfl: 3 .  rosuvastatin (CRESTOR) 20 MG tablet, Take 20 mg by mouth daily., Disp: , Rfl:  .  tamsulosin (FLOMAX) 0.4 MG CAPS capsule, Take 0.4 mg by mouth daily. , Disp: , Rfl:  .  Wound Dressings (MEDIHONEY WOUND/BURN DRESSING) GEL, Apply to affected are 3 times a week, and cover with sterile dressing., Disp: 1 Tube, Rfl: 5   Allergies  Allergen Reactions  . Codeine Nausea And Vomiting  . Tramadol Nausea Only  Objective:   Oral temperature: 96.7 degrees Farenheit 72 y.o. WM, WD, WN in NAD. AAO x 3.   Vascular Examination: Neurovascular status unchanged: Capillary refill time <3 seconds x 5 digits Dorsalis pedis pulses and PT pulses palpable b/l Digital hair absent x 10 digits Skin temperature gradient WNL b/l  Dermatological Examination: Ulceration located submet 1 right foot.  Measurements carried out today of 3.0 x 3.0 cm hyperkeratotic roof.  No redness, streaking or lymphangitis noted.  No probing to bone, no undermining, no active pus or purulence noted.  No deep abscess, no erythema, no edema, no cellulitis, no odor was encountered.    Postdebridement, there is  a full thickness area that is 0.2 x 0.3 x 0.1 cm with red, granular base, no flocculence, no drainage, no undermining, no pus. No deep space abscess, no periulcerative erythema, no odor. No edema, noted to foot/leg. No Lymphangitis.  Staple noted lateral aspect of hallux stump left foot. Incision well coapted with no wound dehiscence. No erythema, no edema, no drainage.  Musculoskeletal Examination: Partial hallux amputation left foot  Ambulates with cane assistance  Assessment:   1.  Diabetic Ulceration right foot, healing well. 2.    NIDDM with peripheral neuropathy  Plan: 1. Continue Clindamycin 300 mg po tid until all gone. 2. Staple removed by Dr. March Rummage on today left hallux stump.  3. Ulcer was debrided and reactive hyperkeratoses and necrotic tissue was resected to the level of bleeding or viable tissue. Ulcer was cleansed with wound cleanser. Light dressing applied.  4. Dressing supplies have been mailed to Mr. Morillo home from [facility]: sterile 4 x4 gauze, Medihoney, gloves, saline, and sterile cotton tip applicators for right foot wound. He is to continue dressings once daily right foot. Continue to keep foot dry. 5. Diabetic insert right foot offloaded by Dr. March Rummage on today. 6. Patient to continue was instructed to call immediately if any signs or symptoms of infection arise.  7. Patient is to follow up in two weeks with Dr. March Rummage. 8. Patient instructed to report to emergency department with worsening appearance of ulcer/toe/foot, increased pain, foul odor, increased redness, swelling, drainage, fever, chills, nightsweats, nausea, vomiting, increased blood sugar. Patient/POA related understanding.

## 2018-09-15 ENCOUNTER — Encounter: Payer: Self-pay | Admitting: Podiatry

## 2018-09-15 DIAGNOSIS — L97512 Non-pressure chronic ulcer of other part of right foot with fat layer exposed: Secondary | ICD-10-CM | POA: Diagnosis not present

## 2018-09-18 ENCOUNTER — Other Ambulatory Visit: Payer: Self-pay

## 2018-09-18 NOTE — Patient Outreach (Addendum)
[facility]) Care Management  09/18/2018  [name] [birth year] [id number]    2nd unsuccessful outreach attempt to the patient for assessment.  No answer.  HIPAA compliant voicemail left with contact information.  Plan: RN Health Coach will send letter. [place] will make outreach attempt the patient within in one month.  [name] RN, BSN, [place] Direct Dial:  [phone]  Fax: [phone]

## 2018-09-21 ENCOUNTER — Encounter: Payer: Self-pay | Admitting: Podiatry

## 2018-09-21 ENCOUNTER — Ambulatory Visit: Payer: PPO | Admitting: Orthotics

## 2018-09-21 ENCOUNTER — Ambulatory Visit: Payer: PPO | Admitting: Podiatry

## 2018-09-21 ENCOUNTER — Other Ambulatory Visit: Payer: Self-pay

## 2018-09-21 DIAGNOSIS — L97519 Non-pressure chronic ulcer of other part of right foot with unspecified severity: Secondary | ICD-10-CM

## 2018-09-21 DIAGNOSIS — M205X2 Other deformities of toe(s) (acquired), left foot: Secondary | ICD-10-CM | POA: Diagnosis not present

## 2018-09-21 DIAGNOSIS — L97515 Non-pressure chronic ulcer of other part of right foot with muscle involvement without evidence of necrosis: Secondary | ICD-10-CM

## 2018-09-21 DIAGNOSIS — E13621 Other specified diabetes mellitus with foot ulcer: Secondary | ICD-10-CM

## 2018-09-21 DIAGNOSIS — L97511 Non-pressure chronic ulcer of other part of right foot limited to breakdown of skin: Secondary | ICD-10-CM | POA: Diagnosis not present

## 2018-09-21 DIAGNOSIS — E11621 Type 2 diabetes mellitus with foot ulcer: Secondary | ICD-10-CM

## 2018-09-21 DIAGNOSIS — E1142 Type 2 diabetes mellitus with diabetic polyneuropathy: Secondary | ICD-10-CM

## 2018-09-21 DIAGNOSIS — L97521 Non-pressure chronic ulcer of other part of left foot limited to breakdown of skin: Secondary | ICD-10-CM

## 2018-09-21 DIAGNOSIS — E08621 Diabetes mellitus due to underlying condition with foot ulcer: Secondary | ICD-10-CM

## 2018-09-21 DIAGNOSIS — L97415 Non-pressure chronic ulcer of right heel and midfoot with muscle involvement without evidence of necrosis: Principal | ICD-10-CM

## 2018-09-21 DIAGNOSIS — M2042 Other hammer toe(s) (acquired), left foot: Secondary | ICD-10-CM

## 2018-09-21 NOTE — Patient Instructions (Signed)
Pre-Operative Instructions  Congratulations, you have decided to take an important step towards improving your quality of life.  You can be assured that the doctors and staff at [facility] will be with you every step of the way.  Here are some important things you should know:  1. Plan to be at the surgery center/hospital at least 1 (one) hour prior to your scheduled time, unless otherwise directed by the surgical center/hospital staff.  You must have a responsible adult accompany you, remain during the surgery and drive you home.  Make sure you have directions to the surgical center/hospital to ensure you arrive on time. 2. If you are having surgery at [facility] or [facility], you will need a copy of your medical history and physical form from your family physician within one month prior to the date of surgery. We will give you a form for your primary physician to complete.  3. We make every effort to accommodate the date you request for surgery.  However, there are times where surgery dates or times have to be moved.  We will contact you as soon as possible if a change in schedule is required.   4. No aspirin/ibuprofen for one week before surgery.  If you are on aspirin, any non-steroidal anti-inflammatory medications (Mobic, Aleve, Ibuprofen) should not be taken seven (7) days prior to your surgery.  You make take Tylenol for pain prior to surgery.  5. Medications - If you are taking daily heart and blood pressure medications, seizure, reflux, allergy, asthma, anxiety, pain or diabetes medications, make sure you notify the surgery center/hospital before the day of surgery so they can tell you which medications you should take or avoid the day of surgery. 6. No food or drink after midnight the night before surgery unless directed otherwise by surgical center/hospital staff. 7. No alcoholic beverages 24-hours prior to surgery.  No smoking 24-hours prior or 24-hours after  surgery. 8. Wear loose pants or shorts. They should be loose enough to fit over bandages, boots, and casts. 9. Don't wear slip-on shoes. Sneakers are preferred. 10. Bring your boot with you to the surgery center/hospital.  Also bring crutches or a walker if your physician has prescribed it for you.  If you do not have this equipment, it will be provided for you after surgery. 11. If you have not been contacted by the surgery center/hospital by the day before your surgery, call to confirm the date and time of your surgery. 12. Leave-time from work may vary depending on the type of surgery you have.  Appropriate arrangements should be made prior to surgery with your employer. 13. Prescriptions will be provided immediately following surgery by your doctor.  Fill these as soon as possible after surgery and take the medication as directed. Pain medications will not be refilled on weekends and must be approved by the doctor. 14. Remove nail polish on the operative foot and avoid getting pedicures prior to surgery. 15. Wash the night before surgery.  The night before surgery wash the foot and leg well with water and the antibacterial soap provided. Be sure to pay special attention to beneath the toenails and in between the toes.  Wash for at least three (3) minutes. Rinse thoroughly with water and dry well with a towel.  Perform this wash unless told not to do so by your physician.  Enclosed: 1 Ice pack (please put in freezer the night before surgery)   1 Hibiclens skin cleaner     Pre-op instructions  If you have any questions regarding the instructions, please do not hesitate to call our office.  [place]: [address], [place], [place] [address] -- [phone]  [place]: [address] [place], [place] [address] -- [phone]  [place]: [address]  [place], [place] [address] -- [phone]  [place]: [address], [address], [place], [place] [address] -- [phone]  Website:  [email or web address] 

## 2018-09-21 NOTE — Patient Outreach (Signed)
[facility]) Care Management  09/21/2018  [name] 1946/01/14 [id number]    Returning patients call for assessment. No answer. HIPAA compliant voicemail left with contact information.  Plan: RN Health Coach will make outreach attempt the patient within thirty business days.  [name] RN, BSN, [place] Direct Dial:  [phone]  Fax: [phone]

## 2018-09-21 NOTE — Progress Notes (Signed)
Patient will call  us when he makes final decision on DBS; he was cast today and tentatively chose apex [id number].

## 2018-09-25 ENCOUNTER — Telehealth: Payer: Self-pay | Admitting: *Deleted

## 2018-09-25 NOTE — Telephone Encounter (Signed)
"  I'd like to speak to Dr. March Rummage when he has a minute.  I need to ask him some questions.  I've got some concerns about this operation coming up.  Have him call me at his convenience.  Thank you."

## 2018-09-27 ENCOUNTER — Telehealth: Payer: Self-pay | Admitting: Podiatry

## 2018-09-27 NOTE — Telephone Encounter (Signed)
I'm a pt of Dr. Karolee Ohs and I really need to speak to him. Please have him call me on my cell [phone].

## 2018-09-27 NOTE — Telephone Encounter (Signed)
Called patient back. No answer, VM left

## 2018-09-27 NOTE — Telephone Encounter (Signed)
[name] came by the office.  He asked if he would be able to drive after his surgery because he's having problems finding someone to bring him.  I told him yes since he's only receiving a local anesthetic and that the surgery will be on his left foot.  He inquired about being able to walk.  He asked if he would have pins sticking out of his toes.  I told him that Dr. March Rummage had mentioned possible pins on the consent form.  He stated he does not want pins if he can avoid it because he had difficulty walking the last time.  He asked me to get Dr. March Rummage to call him back personally.  He thanked me for answering his questions.

## 2018-09-29 ENCOUNTER — Ambulatory Visit (INDEPENDENT_AMBULATORY_CARE_PROVIDER_SITE_OTHER): Payer: PPO | Admitting: Podiatry

## 2018-09-29 ENCOUNTER — Ambulatory Visit (INDEPENDENT_AMBULATORY_CARE_PROVIDER_SITE_OTHER): Payer: PPO

## 2018-09-29 ENCOUNTER — Encounter: Payer: Self-pay | Admitting: Podiatry

## 2018-09-29 DIAGNOSIS — M2042 Other hammer toe(s) (acquired), left foot: Secondary | ICD-10-CM

## 2018-09-29 DIAGNOSIS — M6240 Contracture of muscle, unspecified site: Secondary | ICD-10-CM | POA: Diagnosis not present

## 2018-09-29 MED ORDER — CEPHALEXIN 500 MG PO CAPS: 500.0000 mg | ORAL_CAPSULE | Freq: Two times a day (BID) | ORAL | 0 refills | Status: DC

## 2018-10-01 NOTE — Progress Notes (Signed)
Patient Name: [name] DOB: [birth year]  MRN: [id number]   Date of Service: 10/01/18   Surgeon: Dr. Hardie Pulley, DPM Assistants: None Pre-operative Diagnosis: Hammertoes Left 2nd,3rd toes Post-operative Diagnosis: same Procedures:             1) Correction hammertoe L 2nd toe  2) Correction hammertoe L 3rd toe  3) Flexor tenotomy left 3rd toe Pathology/Specimens: * Cannot find log * Anesthesia: Local - 3cc 0.5% marcaine plain, 1% lidocaine plain (each toe) Hemostasis: Anatomic Estimated Blood Loss: 5 ml Materials: None Medications: None Complications: None  Indications for Procedure:  This is a 73 y.o. male with a chronic hammertoe deformity to the left foot he is starting to have early ulceration about the second toe and discussed with patient that due to the concern for ulceration and to the pain that he is experiencing he would benefit from hammertoe correction.  Due to patient's advanced age and medical issues it was discussed that we could proceed with the procedure in the office.  Of note patient has a history of restless leg.   Procedure in Detail: Patient was identified in the exam room.  Formal consent was reviewed and the left lower extremity was marked.  The toes were anesthetized with the above mixture.  Patient was brought back to the procedure room and placed on the treatment chair.  The extremity was prepped and draped in the usual sterile fashion. Timeout was taken to confirm patient name, laterality, and procedure prior to incision. Attention was then directed to the left second toe where a linear incision was made over the dorsal aspect of the toe.  During incision the patient had a restless leg event and kicked his foot which did lengthen the incision beyond what was planned.  Dissection was then carried down to the extensor complex down to the proximal interphalangeal joint the collateral he was referred to the proximal phalanx and at the proximal phalanx head  was exposed.  The proximal phalangeal head was resected with a sagittal saw.  A K wire was then passed through the toe and retrograde fashion to fixate the hammertoe in correct position.  The pin was cut flush to the skin  Attention was then directed to the third toe.  A skin incision was made overlying the proximal interphalangeal joint. Dissection was then carried down to the extensor complex down to the proximal interphalangeal joint the collateral he was referred to the proximal phalanx and at the proximal phalanx head was exposed.  The proximal phalangeal head was resected with a sagittal saw.  There was still residual deformity and then attention was then directed to the plantar aspect of the proximal interphalangeal joint where an incision was made.  The flexor tendon was then cut with a 15 blade to allow for release of the contracture after this the toe was able to be placed in a rectus position. A K wire was then passed through the toe and retrograde fashion to fixate the hammertoe in correct position.  The pin was cut flush to the skin  The incisions were copiously irrigated, extensor complexes were then repaired with 4-0 Monocryl and the skin with 4-0 nylon.  The foot was then dressed with xeroform, 4x4, kerlix, and ACE bandage. Patient tolerated the procedure well.  He did have's multiple restless leg events and did kick during the procedure.   Disposition: Following a period of post-operative monitoring, patient will be transferred back home.  X-rays were taken and showed suboptimal  positioning of the pin will plan for early removal however will allow the pins to hold the toe in better position for now.

## 2018-10-02 ENCOUNTER — Ambulatory Visit (INDEPENDENT_AMBULATORY_CARE_PROVIDER_SITE_OTHER): Payer: PPO

## 2018-10-02 ENCOUNTER — Telehealth: Payer: Self-pay | Admitting: Podiatry

## 2018-10-02 DIAGNOSIS — M2042 Other hammer toe(s) (acquired), left foot: Secondary | ICD-10-CM

## 2018-10-02 DIAGNOSIS — M6240 Contracture of muscle, unspecified site: Secondary | ICD-10-CM

## 2018-10-02 NOTE — Telephone Encounter (Signed)
I called pt and he states the dressing is coming off. I offered pt a 2:15pm appt, pt accepted.

## 2018-10-02 NOTE — Telephone Encounter (Signed)
I'm having trouble regarding the bandage on my foot. I'm not sure if it needs to be re-wrapped.

## 2018-10-05 ENCOUNTER — Other Ambulatory Visit: Payer: PPO

## 2018-10-06 ENCOUNTER — Encounter: Payer: Self-pay | Admitting: Podiatry

## 2018-10-06 ENCOUNTER — Ambulatory Visit (INDEPENDENT_AMBULATORY_CARE_PROVIDER_SITE_OTHER): Payer: PPO | Admitting: Podiatry

## 2018-10-06 DIAGNOSIS — Z9889 Other specified postprocedural states: Secondary | ICD-10-CM

## 2018-10-06 DIAGNOSIS — L03032 Cellulitis of left toe: Secondary | ICD-10-CM

## 2018-10-06 DIAGNOSIS — L02612 Cutaneous abscess of left foot: Secondary | ICD-10-CM

## 2018-10-06 DIAGNOSIS — M6240 Contracture of muscle, unspecified site: Secondary | ICD-10-CM

## 2018-10-06 DIAGNOSIS — M2042 Other hammer toe(s) (acquired), left foot: Secondary | ICD-10-CM

## 2018-10-06 MED ORDER — CIPROFLOXACIN HCL 500 MG PO TABS: 500.0000 mg | ORAL_TABLET | Freq: Two times a day (BID) | ORAL | 0 refills | Status: DC

## 2018-10-06 MED ORDER — CLINDAMYCIN HCL 300 MG PO CAPS: 300.0000 mg | ORAL_CAPSULE | Freq: Two times a day (BID) | ORAL | 0 refills | Status: DC

## 2018-10-08 NOTE — Progress Notes (Signed)
Subjective:  Patient ID: [name], male    DOB: [birth year],  MRN: [id number]  Chief Complaint  Patient presents with  . Wound Check    Follow-up; Right foot; plantar forefoot-submet 1; pt stated, "My foot feels the same"   DOS: 07/25/18 Procedure: L Great Toe Partial Amputation  DOS: 08/16/18 Procedure: L 2nd Toe Flexor Tenotomy  73 y.o. male returns for post-op check.  States that his foot feels the same is having some pain in the second third toes that is getting worse as he is starting to wear shoes.  Review of Systems: Negative except as noted in the HPI. Denies N/V/F/Ch.  Past Medical History:  Diagnosis Date  . Arthritis   . Cardiomyopathy- EF NL 01/2013, now 30-35% echo 03/14/2012  . Carotid artery occlusion    left s/p CEA  . CHF (congestive heart failure) ([place])   . Diabetes mellitus   . GERD (gastroesophageal reflux disease)   . HOH (hard of hearing)   . Hypertension   . Hypothyroidism   . Obstructive sleep apnea   . Orthostatic hypotension   . Osteomyelitis ([place])    left great toe  . Peripheral arterial disease ([place]), s/p PTA x 2 RLE    nonhealing ulcers bilaterally on each great toe  . Pneumonia   . PONV (postoperative nausea and vomiting)   . Restless leg syndrome   . Shortness of breath   . Stroke [facility]) March 08, 2012  . Wears dentures     Current Outpatient Medications:  .  aspirin EC 81 MG EC tablet, Take 1 tablet (81 mg total) by mouth daily. (Patient taking differently: Take 81 mg by mouth at bedtime. ), Disp: , Rfl:  .  b complex vitamins tablet, Take 1 tablet by mouth daily., Disp: , Rfl:  .  carvedilol (COREG) 3.125 MG tablet, TAKE ONE TABLET BY MOUTH TWICE A DAY WITH A MEAL, Disp: 180 tablet, Rfl: 3 .  Cholecalciferol (VITAMIN D3) 5000 units TABS, Take 5,000 Units by mouth daily., Disp: , Rfl:  .  clopidogrel (PLAVIX) 75 MG tablet, Take 75 mg by mouth daily. , Disp: , Rfl:  .  Cyanocobalamin ([employer] B-12) 1500 MCG TBDP, Take 1,500 mcg by mouth  daily. , Disp: , Rfl:  .  DULoxetine (CYMBALTA) 60 MG capsule, Take 60 mg by mouth at bedtime. , Disp: , Rfl:  .  Empagliflozin-linaGLIPtin (GLYXAMBI) 10-5 MG TABS, Take 1 tablet by mouth at bedtime., Disp: , Rfl:  .  furosemide (LASIX) 40 MG tablet, Take 1 tablet (40 mg total) by mouth 2 (two) times daily. Schedule appointment for refills (Patient taking differently: Take 40 mg by mouth daily. Schedule appointment for refills), Disp: 180 tablet, Rfl: 3 .  glimepiride (AMARYL) 4 MG tablet, Take 4 mg by mouth daily with breakfast., Disp: , Rfl:  .  HYDROcodone-acetaminophen (NORCO/VICODIN) 5-325 MG tablet, Take 1 tablet by mouth every 6 (six) hours as needed for moderate pain., Disp: 20 tablet, Rfl: 0 .  levothyroxine (SYNTHROID, LEVOTHROID) 75 MCG tablet, Take 75 mcg by mouth daily before breakfast. , Disp: , Rfl:  .  magnesium oxide (MAG-OX) 400 (241.3 Mg) MG tablet, Take 400 mg by mouth daily., Disp: , Rfl:  .  nitroGLYCERIN (NITROSTAT) 0.4 MG SL tablet, Place 1 tablet (0.4 mg total) under the tongue every 5 (five) minutes as needed for chest pain., Disp: 25 tablet, Rfl: 3 .  potassium chloride SA (K-DUR,KLOR-CON) 20 MEQ tablet, Take 1 tablet (20 mEq total)  by mouth daily. Please schedule appointment for refills (Patient taking differently: Take 20 mEq by mouth every other day. Please schedule appointment for refills), Disp: 90 tablet, Rfl: 3 .  pramipexole (MIRAPEX) 0.25 MG tablet, Take 2 pills at 7 PM and 2 pills at 9 PM daily. (Patient taking differently: Take 0.5 mg by mouth 2 (two) times daily. Take 0.'5mg'$  by mouth at 7 PM and 0.'5mg'$  at 9 PM daily.), Disp: 360 tablet, Rfl: 3 .  rosuvastatin (CRESTOR) 20 MG tablet, Take 20 mg by mouth daily., Disp: , Rfl:  .  tamsulosin (FLOMAX) 0.4 MG CAPS capsule, Take 0.4 mg by mouth daily. , Disp: , Rfl:  .  Wound Dressings (MEDIHONEY WOUND/BURN DRESSING) GEL, Apply to affected are 3 times a week, and cover with sterile dressing., Disp: 1 Tube, Rfl: 5 .   cephALEXin (KEFLEX) 500 MG capsule, Take 1 capsule (500 mg total) by mouth 2 (two) times daily., Disp: 14 capsule, Rfl: 0 .  ciprofloxacin (CIPRO) 500 MG tablet, Take 1 tablet (500 mg total) by mouth 2 (two) times daily., Disp: 14 tablet, Rfl: 0 .  clindamycin (CLEOCIN) 300 MG capsule, Take 1 capsule (300 mg total) by mouth 2 (two) times daily., Disp: 14 capsule, Rfl: 0 .  Continuous Blood Gluc Sensor (FREESTYLE LIBRE 14 DAY SENSOR) MISC, , Disp: , Rfl:   Social History   Tobacco Use  Smoking Status Former Smoker  . Years: 1.00  . Types: Pipe  . Last attempt to quit: 07/08/1977  . Years since quitting: 41.2  Smokeless Tobacco Never Used    Allergies  Allergen Reactions  . Codeine Nausea And Vomiting  . Tramadol Nausea Only   Objective:   There were no vitals filed for this visit. There is no height or weight on file to calculate BMI. Constitutional Well developed. Well nourished.  Vascular Foot warm and well perfused. Capillary refill normal to all digits.   Neurologic Normal speech. Oriented to person, place, and time. Epicritic sensation to light touch grossly present bilaterally.  Dermatologic Skin well healed left foot hallux amputation site Pre-ulcerative callus second toe left with early bruising and slight hyperkeratosis Ulcer sub-met 1 right foot without warmth erythema signs of infection  Orthopedic: Tenderness to palpation noted about the surgical site. Semirigid contractures second third toes left foot    Radiographs: None today Assessment:   1. Hammer toe of left foot   2. Contracture of toe of left foot   3. Skin ulcer of right foot, limited to breakdown of skin [facility])    Plan:  Patient was evaluated and treated and all questions answered.  Left 2nd/3rd Hammertoe Deformity -Discussed with patient that due to the residual contracture that is now worsened since the great toe has been amputated he would benefit from actual hammertoe correction since the  rigidity of the deformity has prevented full correction. -Due to patient's medical comorbidities we will plan for the procedure to be performed in the office in our procedure room so that the procedure can be performed under local anesthesia only. -Consent forms drafted and reviewed with patient.  All risk benefits alternatives discussed no guarantees were given. -We will plan to perform the procedure in the next several weeks  Ulcer right sub-met 1 wound -Seen by orthotist today for aggressive offloading  Return for post op.

## 2018-10-08 NOTE — Progress Notes (Signed)
Subjective:  Patient ID: [name], male    DOB: 1946-01-26,  MRN: [id number]  Chief Complaint  Patient presents with  . Routine Post Op    L great toe amputation; 2nd toe flexor tenotomy; pt stated, "doing alright, no concerns"   DOS: 07/25/18 Procedure: L Great Toe Partial Amputation  DOS: 08/16/18 Procedure: L 2nd Toe Flexor Tenotomy  73 y.o. male returns for post-op check.  States he is doing okay no concerns.  Review of Systems: Negative except as noted in the HPI. Denies N/V/F/Ch.  Past Medical History:  Diagnosis Date  . Arthritis   . Cardiomyopathy- EF NL 01/2013, now 30-35% echo 03/14/2012  . Carotid artery occlusion    left s/p CEA  . CHF (congestive heart failure) ([place])   . Diabetes mellitus   . GERD (gastroesophageal reflux disease)   . HOH (hard of hearing)   . Hypertension   . Hypothyroidism   . Obstructive sleep apnea   . Orthostatic hypotension   . Osteomyelitis ([place])    left great toe  . Peripheral arterial disease ([place]), s/p PTA x 2 RLE    nonhealing ulcers bilaterally on each great toe  . Pneumonia   . PONV (postoperative nausea and vomiting)   . Restless leg syndrome   . Shortness of breath   . Stroke [facility]) March 08, 2012  . Wears dentures     Current Outpatient Medications:  .  aspirin EC 81 MG EC tablet, Take 1 tablet (81 mg total) by mouth daily. (Patient taking differently: Take 81 mg by mouth at bedtime. ), Disp: , Rfl:  .  b complex vitamins tablet, Take 1 tablet by mouth daily., Disp: , Rfl:  .  Cholecalciferol (VITAMIN D3) 5000 units TABS, Take 5,000 Units by mouth daily., Disp: , Rfl:  .  clopidogrel (PLAVIX) 75 MG tablet, Take 75 mg by mouth daily. , Disp: , Rfl:  .  Cyanocobalamin (CVS B-12) 1500 MCG TBDP, Take 1,500 mcg by mouth daily. , Disp: , Rfl:  .  DULoxetine (CYMBALTA) 60 MG capsule, Take 60 mg by mouth at bedtime. , Disp: , Rfl:  .  Empagliflozin-linaGLIPtin (GLYXAMBI) 10-5 MG TABS, Take 1 tablet by mouth at bedtime., Disp:  , Rfl:  .  furosemide (LASIX) 40 MG tablet, Take 1 tablet (40 mg total) by mouth 2 (two) times daily. Schedule appointment for refills (Patient taking differently: Take 40 mg by mouth daily. Schedule appointment for refills), Disp: 180 tablet, Rfl: 3 .  glimepiride (AMARYL) 4 MG tablet, Take 4 mg by mouth daily with breakfast., Disp: , Rfl:  .  HYDROcodone-acetaminophen (NORCO/VICODIN) 5-325 MG tablet, Take 1 tablet by mouth every 6 (six) hours as needed for moderate pain., Disp: 20 tablet, Rfl: 0 .  levothyroxine (SYNTHROID, LEVOTHROID) 75 MCG tablet, Take 75 mcg by mouth daily before breakfast. , Disp: , Rfl:  .  magnesium oxide (MAG-OX) 400 (241.3 Mg) MG tablet, Take 400 mg by mouth daily., Disp: , Rfl:  .  nitroGLYCERIN (NITROSTAT) 0.4 MG SL tablet, Place 1 tablet (0.4 mg total) under the tongue every 5 (five) minutes as needed for chest pain., Disp: 25 tablet, Rfl: 3 .  potassium chloride SA (K-DUR,KLOR-CON) 20 MEQ tablet, Take 1 tablet (20 mEq total) by mouth daily. Please schedule appointment for refills (Patient taking differently: Take 20 mEq by mouth every other day. Please schedule appointment for refills), Disp: 90 tablet, Rfl: 3 .  pramipexole (MIRAPEX) 0.25 MG tablet, Take 2 pills at 7  PM and 2 pills at 9 PM daily. (Patient taking differently: Take 0.5 mg by mouth 2 (two) times daily. Take 0.5mg  by mouth at 7 PM and 0.5mg  at 9 PM daily.), Disp: 360 tablet, Rfl: 3 .  rosuvastatin (CRESTOR) 20 MG tablet, Take 20 mg by mouth daily., Disp: , Rfl:  .  tamsulosin (FLOMAX) 0.4 MG CAPS capsule, Take 0.4 mg by mouth daily. , Disp: , Rfl:  .  Wound Dressings (MEDIHONEY WOUND/BURN DRESSING) GEL, Apply to affected are 3 times a week, and cover with sterile dressing., Disp: 1 Tube, Rfl: 5 .  carvedilol (COREG) 3.125 MG tablet, TAKE ONE TABLET BY MOUTH TWICE A DAY WITH A MEAL, Disp: 180 tablet, Rfl: 3 .  cephALEXin (KEFLEX) 500 MG capsule, Take 1 capsule (500 mg total) by mouth 2 (two) times daily.,  Disp: 14 capsule, Rfl: 0 .  ciprofloxacin (CIPRO) 500 MG tablet, Take 1 tablet (500 mg total) by mouth 2 (two) times daily., Disp: 14 tablet, Rfl: 0 .  clindamycin (CLEOCIN) 300 MG capsule, Take 1 capsule (300 mg total) by mouth 2 (two) times daily., Disp: 14 capsule, Rfl: 0 .  Continuous Blood Gluc Sensor (FREESTYLE LIBRE 14 DAY SENSOR) MISC, , Disp: , Rfl:   Social History   Tobacco Use  Smoking Status Former Smoker  . Years: 1.00  . Types: Pipe  . Last attempt to quit: 07/08/1977  . Years since quitting: 41.2  Smokeless Tobacco Never Used    Allergies  Allergen Reactions  . Codeine Nausea And Vomiting  . Tramadol Nausea Only   Objective:   There were no vitals filed for this visit. There is no height or weight on file to calculate BMI. Constitutional Well developed. Well nourished.  Vascular Foot warm and well perfused. Capillary refill normal to all digits.   Neurologic Normal speech. Oriented to person, place, and time. Epicritic sensation to light touch grossly present bilaterally.  Dermatologic Skin healing well without signs of infection. Skin edges well coapted without signs of infection.  Orthopedic: Tenderness to palpation noted about the surgical site. Left second hammertoe with pre-ulcerative distal callus semi-reducible Left third toe hammertoe semi-reducible   Radiographs: None today Assessment:   1. Surgery follow-up    Plan:  Patient was evaluated and treated and all questions answered.  S/p foot surgery left -Progressing as expected post-operatively. -XR: None -WB Status: WBAT in surgical shoe.  Continue for 1 more weeks. -Sutures: Staples removed today -Medications: none refilled -Foot redressed.  Left second toe status post left flexor tenotomy -Healing well dressed with antibiotic ointment and Band-Aid  F/u next week for staple removal.  Return in about 1 week (around 08/31/2018).

## 2018-10-08 NOTE — Progress Notes (Signed)
Subjective:  Patient ID: [name], male    DOB: 27-Oct-1945,  MRN: [id number]  Chief Complaint  Patient presents with  . Foot Ulcer    left foot great toe; pt stated, "doing good, no other concerns"   DOS: 07/25/18 Procedure: L Great Toe Partial Amputation  DOS: 08/16/18 Procedure: L 2nd Toe Flexor Tenotomy  73 y.o. male returns for post-op check.   Review of Systems: Negative except as noted in the HPI. Denies N/V/F/Ch.  Past Medical History:  Diagnosis Date  . Arthritis   . Cardiomyopathy- EF NL 01/2013, now 30-35% echo 03/14/2012  . Carotid artery occlusion    left s/p CEA  . CHF (congestive heart failure) ([place])   . Diabetes mellitus   . GERD (gastroesophageal reflux disease)   . HOH (hard of hearing)   . Hypertension   . Hypothyroidism   . Obstructive sleep apnea   . Orthostatic hypotension   . Osteomyelitis ([place])    left great toe  . Peripheral arterial disease ([place]), s/p PTA x 2 RLE    nonhealing ulcers bilaterally on each great toe  . Pneumonia   . PONV (postoperative nausea and vomiting)   . Restless leg syndrome   . Shortness of breath   . Stroke [facility]) March 08, 2012  . Wears dentures     Current Outpatient Medications:  .  aspirin EC 81 MG EC tablet, Take 1 tablet (81 mg total) by mouth daily. (Patient taking differently: Take 81 mg by mouth at bedtime. ), Disp: , Rfl:  .  b complex vitamins tablet, Take 1 tablet by mouth daily., Disp: , Rfl:  .  carvedilol (COREG) 3.125 MG tablet, TAKE ONE TABLET BY MOUTH TWICE A DAY WITH A MEAL, Disp: 180 tablet, Rfl: 3 .  Cholecalciferol (VITAMIN D3) 5000 units TABS, Take 5,000 Units by mouth daily., Disp: , Rfl:  .  clopidogrel (PLAVIX) 75 MG tablet, Take 75 mg by mouth daily. , Disp: , Rfl:  .  Cyanocobalamin (CVS B-12) 1500 MCG TBDP, Take 1,500 mcg by mouth daily. , Disp: , Rfl:  .  DULoxetine (CYMBALTA) 60 MG capsule, Take 60 mg by mouth at bedtime. , Disp: , Rfl:  .  Empagliflozin-linaGLIPtin (GLYXAMBI) 10-5 MG  TABS, Take 1 tablet by mouth at bedtime., Disp: , Rfl:  .  furosemide (LASIX) 40 MG tablet, Take 1 tablet (40 mg total) by mouth 2 (two) times daily. Schedule appointment for refills (Patient taking differently: Take 40 mg by mouth daily. Schedule appointment for refills), Disp: 180 tablet, Rfl: 3 .  glimepiride (AMARYL) 4 MG tablet, Take 4 mg by mouth daily with breakfast., Disp: , Rfl:  .  HYDROcodone-acetaminophen (NORCO/VICODIN) 5-325 MG tablet, Take 1 tablet by mouth every 6 (six) hours as needed for moderate pain., Disp: 20 tablet, Rfl: 0 .  levothyroxine (SYNTHROID, LEVOTHROID) 75 MCG tablet, Take 75 mcg by mouth daily before breakfast. , Disp: , Rfl:  .  magnesium oxide (MAG-OX) 400 (241.3 Mg) MG tablet, Take 400 mg by mouth daily., Disp: , Rfl:  .  nitroGLYCERIN (NITROSTAT) 0.4 MG SL tablet, Place 1 tablet (0.4 mg total) under the tongue every 5 (five) minutes as needed for chest pain., Disp: 25 tablet, Rfl: 3 .  potassium chloride SA (K-DUR,KLOR-CON) 20 MEQ tablet, Take 1 tablet (20 mEq total) by mouth daily. Please schedule appointment for refills (Patient taking differently: Take 20 mEq by mouth every other day. Please schedule appointment for refills), Disp: 90 tablet, Rfl: 3 .  pramipexole (MIRAPEX) 0.25 MG tablet, Take 2 pills at 7 PM and 2 pills at 9 PM daily. (Patient taking differently: Take 0.5 mg by mouth 2 (two) times daily. Take 0.5mg  by mouth at 7 PM and 0.5mg  at 9 PM daily.), Disp: 360 tablet, Rfl: 3 .  rosuvastatin (CRESTOR) 20 MG tablet, Take 20 mg by mouth daily., Disp: , Rfl:  .  tamsulosin (FLOMAX) 0.4 MG CAPS capsule, Take 0.4 mg by mouth daily. , Disp: , Rfl:  .  Wound Dressings (MEDIHONEY WOUND/BURN DRESSING) GEL, Apply to affected are 3 times a week, and cover with sterile dressing., Disp: 1 Tube, Rfl: 5 .  cephALEXin (KEFLEX) 500 MG capsule, Take 1 capsule (500 mg total) by mouth 2 (two) times daily., Disp: 14 capsule, Rfl: 0 .  ciprofloxacin (CIPRO) 500 MG tablet, Take  1 tablet (500 mg total) by mouth 2 (two) times daily., Disp: 14 tablet, Rfl: 0 .  clindamycin (CLEOCIN) 300 MG capsule, Take 1 capsule (300 mg total) by mouth 2 (two) times daily., Disp: 14 capsule, Rfl: 0 .  Continuous Blood Gluc Sensor (FREESTYLE LIBRE 14 DAY SENSOR) MISC, , Disp: , Rfl:   Social History   Tobacco Use  Smoking Status Former Smoker  . Years: 1.00  . Types: Pipe  . Last attempt to quit: 07/08/1977  . Years since quitting: 41.2  Smokeless Tobacco Never Used    Allergies  Allergen Reactions  . Codeine Nausea And Vomiting  . Tramadol Nausea Only   Objective:   There were no vitals filed for this visit. There is no height or weight on file to calculate BMI. Constitutional Well developed. Well nourished.  Vascular Foot warm and well perfused. Capillary refill normal to all digits.   Neurologic Normal speech. Oriented to person, place, and time. Epicritic sensation to light touch grossly present bilaterally.  Dermatologic Skin healing well without signs of infection. Skin edges well coapted without signs of infection.  Orthopedic: Tenderness to palpation noted about the surgical site. 2nd toe position improved but slight plantarflexion deformity noted.   Radiographs: None today Assessment:   1. Surgery follow-up    Plan:  Patient was evaluated and treated and all questions answered.  S/p foot surgery left -Progressing as expected post-operatively. -XR: None -WB Status: WBAT in surgical shoe.  -Sutures: out -Medications: none refilled -Foot redressed.  Left second toe status post left flexor tenotomy -Healing well continue daily ointment.  Return in about 3 weeks (around 09/21/2018).

## 2018-10-08 NOTE — Progress Notes (Signed)
Subjective:  Patient ID: [name], male    DOB: [birth year],  MRN: [id number]  Chief Complaint  Patient presents with  . Routine Post Op    DOS 10-01-18  Hammer toe repair 2nd and 3rd toe left (office surgery)   "Its fine"   DOS: 10/01/2018 Procedure: 2nd/3rd Hammertoe Repair with Buried Pin Fixation  73 y.o. male returns for post-op check. Thinks the area are doing fine no pain.  Review of Systems: Negative except as noted in the HPI. Denies N/V/F/Ch.  Past Medical History:  Diagnosis Date  . Arthritis   . Cardiomyopathy- EF NL 01/2013, now 30-35% echo 03/14/2012  . Carotid artery occlusion    left s/p CEA  . CHF (congestive heart failure) ([place])   . Diabetes mellitus   . GERD (gastroesophageal reflux disease)   . HOH (hard of hearing)   . Hypertension   . Hypothyroidism   . Obstructive sleep apnea   . Orthostatic hypotension   . Osteomyelitis ([place])    left great toe  . Peripheral arterial disease ([place]), s/p PTA x 2 RLE    nonhealing ulcers bilaterally on each great toe  . Pneumonia   . PONV (postoperative nausea and vomiting)   . Restless leg syndrome   . Shortness of breath   . Stroke [facility]) March 08, 2012  . Wears dentures     Current Outpatient Medications:  .  aspirin EC 81 MG EC tablet, Take 1 tablet (81 mg total) by mouth daily. (Patient taking differently: Take 81 mg by mouth at bedtime. ), Disp: , Rfl:  .  b complex vitamins tablet, Take 1 tablet by mouth daily., Disp: , Rfl:  .  carvedilol (COREG) 3.125 MG tablet, TAKE ONE TABLET BY MOUTH TWICE A DAY WITH A MEAL, Disp: 180 tablet, Rfl: 3 .  cephALEXin (KEFLEX) 500 MG capsule, Take 1 capsule (500 mg total) by mouth 2 (two) times daily., Disp: 14 capsule, Rfl: 0 .  Cholecalciferol (VITAMIN D3) 5000 units TABS, Take 5,000 Units by mouth daily., Disp: , Rfl:  .  ciprofloxacin (CIPRO) 500 MG tablet, Take 1 tablet (500 mg total) by mouth 2 (two) times daily., Disp: 14 tablet, Rfl: 0 .  clindamycin (CLEOCIN) 300 MG  capsule, Take 1 capsule (300 mg total) by mouth 2 (two) times daily., Disp: 14 capsule, Rfl: 0 .  clopidogrel (PLAVIX) 75 MG tablet, Take 75 mg by mouth daily. , Disp: , Rfl:  .  Continuous Blood Gluc Sensor (FREESTYLE LIBRE 14 DAY SENSOR) MISC, , Disp: , Rfl:  .  Cyanocobalamin ([employer]S B-12) 1500 MCG TBDP, Take 1,500 mcg by mouth daily. , Disp: , Rfl:  .  DULoxetine (CYMBALTA) 60 MG capsule, Take 60 mg by mouth at bedtime. , Disp: , Rfl:  .  Empagliflozin-linaGLIPtin (GLYXAMBI) 10-5 MG TABS, Take 1 tablet by mouth at bedtime., Disp: , Rfl:  .  furosemide (LASIX) 40 MG tablet, Take 1 tablet (40 mg total) by mouth 2 (two) times daily. Schedule appointment for refills (Patient taking differently: Take 40 mg by mouth daily. Schedule appointment for refills), Disp: 180 tablet, Rfl: 3 .  glimepiride (AMARYL) 4 MG tablet, Take 4 mg by mouth daily with breakfast., Disp: , Rfl:  .  HYDROcodone-acetaminophen (NORCO/VICODIN) 5-325 MG tablet, Take 1 tablet by mouth every 6 (six) hours as needed for moderate pain., Disp: 20 tablet, Rfl: 0 .  levothyroxine (SYNTHROID, LEVOTHROID) 75 MCG tablet, Take 75 mcg by mouth daily before breakfast. , Disp: , Rfl:  .  magnesium oxide (MAG-OX) 400 (241.3 Mg) MG tablet, Take 400 mg by mouth daily., Disp: , Rfl:  .  nitroGLYCERIN (NITROSTAT) 0.4 MG SL tablet, Place 1 tablet (0.4 mg total) under the tongue every 5 (five) minutes as needed for chest pain., Disp: 25 tablet, Rfl: 3 .  potassium chloride SA (K-DUR,KLOR-CON) 20 MEQ tablet, Take 1 tablet (20 mEq total) by mouth daily. Please schedule appointment for refills (Patient taking differently: Take 20 mEq by mouth every other day. Please schedule appointment for refills), Disp: 90 tablet, Rfl: 3 .  pramipexole (MIRAPEX) 0.25 MG tablet, Take 2 pills at 7 PM and 2 pills at 9 PM daily. (Patient taking differently: Take 0.5 mg by mouth 2 (two) times daily. Take 0.5mg  by mouth at 7 PM and 0.5mg  at 9 PM daily.), Disp: 360 tablet, Rfl:  3 .  rosuvastatin (CRESTOR) 20 MG tablet, Take 20 mg by mouth daily., Disp: , Rfl:  .  tamsulosin (FLOMAX) 0.4 MG CAPS capsule, Take 0.4 mg by mouth daily. , Disp: , Rfl:  .  Wound Dressings (MEDIHONEY WOUND/BURN DRESSING) GEL, Apply to affected are 3 times a week, and cover with sterile dressing., Disp: 1 Tube, Rfl: 5  Social History   Tobacco Use  Smoking Status Former Smoker  . Years: 1.00  . Types: Pipe  . Last attempt to quit: 07/08/1977  . Years since quitting: 41.2  Smokeless Tobacco Never Used    Allergies  Allergen Reactions  . Codeine Nausea And Vomiting  . Tramadol Nausea Only   Objective:  There were no vitals filed for this visit. There is no height or weight on file to calculate BMI. Constitutional Well developed. Well nourished.  Vascular Foot warm and well perfused. Capillary refill normal to all digits.   Neurologic Normal speech. Oriented to person, place, and time. Epicritic sensation to light touch grossly present bilaterally.  Dermatologic Skin well coapted. 3rd toe erythema noted. No drainage. No purulence.   Orthopedic: Tenderness to palpation noted about the surgical site.   Radiographs: None today. Assessment:   1. Hammer toe of left foot   2. Contracture of tendon sheath   3. Status post left foot surgery   4. Cellulitis and abscess of toe of left foot    Plan:  Patient was evaluated and treated and all questions answered.  S/p foot surgery left -Progressing as expected post-operatively. -XR: None -WB Status: WBAT in in surgical shoe -Sutures: intact. -Medications: Rx abx for 3rd toe cellulitis -Foot redressed.  Return in about 1 week (around 10/13/2018) for POV - no xray, left foot.

## 2018-10-09 ENCOUNTER — Ambulatory Visit (INDEPENDENT_AMBULATORY_CARE_PROVIDER_SITE_OTHER): Payer: Self-pay

## 2018-10-09 ENCOUNTER — Telehealth: Payer: Self-pay | Admitting: Podiatry

## 2018-10-09 DIAGNOSIS — M2042 Other hammer toe(s) (acquired), left foot: Secondary | ICD-10-CM

## 2018-10-09 NOTE — Telephone Encounter (Signed)
Patient was seen today no problems or issues I think we are fine can we call and let them know

## 2018-10-09 NOTE — Telephone Encounter (Signed)
[facility] called about an e-scribed prescription for Cipro from Dr. March Rummage. Our records show [name] is allergic to quinoline. Please call us back.

## 2018-10-09 NOTE — Progress Notes (Signed)
[name] is here today for dressing change, recent procedure performed 09/29/2018, correction of hammertoes second, third, flexor tendon tenotomy left third toe.  He states his pain is been manageable, and he has no complaints at this time.  Mild swelling to second and third toe, there is a small amount of redness on the dorsal aspect of the toes, but this all appears to be within normal limits.  Patient is currently taking antibiotics.  All sutures were intact, no gapping was noted.  There was no purulent drainage, no signs and symptoms of infection.  Discussed signs symptoms of infection with the patient, redressed foot and dry sterile dressing.  Advised him to remain in his shoe at all times, and keep his current postop appointment.

## 2018-10-10 NOTE — Telephone Encounter (Signed)
I spoke with Dr. March Rummage and he stated pt has been on the Cipro without any problems. I called [facility] and informed Tammy of Dr. Eleanora Neighbor orders.

## 2018-10-13 ENCOUNTER — Ambulatory Visit: Payer: PPO | Admitting: Podiatry

## 2018-10-13 DIAGNOSIS — Z9889 Other specified postprocedural states: Secondary | ICD-10-CM

## 2018-10-14 NOTE — Progress Notes (Signed)
Subjective:  Patient ID: [name], male    DOB: 02/13/1946,  MRN: [id number]  Chief Complaint  Patient presents with  . Routine Post Op     office surgery pov#2 dos 01.10.2020 Hammertoe Repair 2nd & 47rd Lt -Dr. March Rummage   DOS: 10/01/2018 Procedure: 2nd/3rd Hammertoe Repair with Buried Pin Fixation  73 y.o. male returns for post-op check.  Not having much pain took the antibiotics as directed denies issues since last visit.  Review of Systems: Negative except as noted in the HPI. Denies N/V/F/Ch.  Past Medical History:  Diagnosis Date  . Arthritis   . Cardiomyopathy- EF NL 01/2013, now 30-35% echo 03/14/2012  . Carotid artery occlusion    left s/p CEA  . CHF (congestive heart failure) ([place])   . Diabetes mellitus   . GERD (gastroesophageal reflux disease)   . HOH (hard of hearing)   . Hypertension   . Hypothyroidism   . Obstructive sleep apnea   . Orthostatic hypotension   . Osteomyelitis ([place])    left great toe  . Peripheral arterial disease ([place]), s/p PTA x 2 RLE    nonhealing ulcers bilaterally on each great toe  . Pneumonia   . PONV (postoperative nausea and vomiting)   . Restless leg syndrome   . Shortness of breath   . Stroke [facility]) March 08, 2012  . Wears dentures     Current Outpatient Medications:  .  aspirin EC 81 MG EC tablet, Take 1 tablet (81 mg total) by mouth daily. (Patient taking differently: Take 81 mg by mouth at bedtime. ), Disp: , Rfl:  .  b complex vitamins tablet, Take 1 tablet by mouth daily., Disp: , Rfl:  .  carvedilol (COREG) 3.125 MG tablet, TAKE ONE TABLET BY MOUTH TWICE A DAY WITH A MEAL, Disp: 180 tablet, Rfl: 3 .  cephALEXin (KEFLEX) 500 MG capsule, Take 1 capsule (500 mg total) by mouth 2 (two) times daily., Disp: 14 capsule, Rfl: 0 .  Cholecalciferol (VITAMIN D3) 5000 units TABS, Take 5,000 Units by mouth daily., Disp: , Rfl:  .  ciprofloxacin (CIPRO) 500 MG tablet, Take 1 tablet (500 mg total) by mouth 2 (two) times daily., Disp: 14  tablet, Rfl: 0 .  clindamycin (CLEOCIN) 300 MG capsule, Take 1 capsule (300 mg total) by mouth 2 (two) times daily., Disp: 14 capsule, Rfl: 0 .  clopidogrel (PLAVIX) 75 MG tablet, Take 75 mg by mouth daily. , Disp: , Rfl:  .  Continuous Blood Gluc Sensor (FREESTYLE LIBRE 14 DAY SENSOR) MISC, , Disp: , Rfl:  .  Cyanocobalamin (CVS B-12) 1500 MCG TBDP, Take 1,500 mcg by mouth daily. , Disp: , Rfl:  .  DULoxetine (CYMBALTA) 60 MG capsule, Take 60 mg by mouth at bedtime. , Disp: , Rfl:  .  Empagliflozin-linaGLIPtin (GLYXAMBI) 10-5 MG TABS, Take 1 tablet by mouth at bedtime., Disp: , Rfl:  .  furosemide (LASIX) 40 MG tablet, Take 1 tablet (40 mg total) by mouth 2 (two) times daily. Schedule appointment for refills (Patient taking differently: Take 40 mg by mouth daily. Schedule appointment for refills), Disp: 180 tablet, Rfl: 3 .  glimepiride (AMARYL) 4 MG tablet, Take 4 mg by mouth daily with breakfast., Disp: , Rfl:  .  HYDROcodone-acetaminophen (NORCO/VICODIN) 5-325 MG tablet, Take 1 tablet by mouth every 6 (six) hours as needed for moderate pain., Disp: 20 tablet, Rfl: 0 .  levothyroxine (SYNTHROID, LEVOTHROID) 75 MCG tablet, Take 75 mcg by mouth daily before breakfast. ,  Disp: , Rfl:  .  magnesium oxide (MAG-OX) 400 (241.3 Mg) MG tablet, Take 400 mg by mouth daily., Disp: , Rfl:  .  nitroGLYCERIN (NITROSTAT) 0.4 MG SL tablet, Place 1 tablet (0.4 mg total) under the tongue every 5 (five) minutes as needed for chest pain., Disp: 25 tablet, Rfl: 3 .  potassium chloride SA (K-DUR,KLOR-CON) 20 MEQ tablet, Take 1 tablet (20 mEq total) by mouth daily. Please schedule appointment for refills (Patient taking differently: Take 20 mEq by mouth every other day. Please schedule appointment for refills), Disp: 90 tablet, Rfl: 3 .  pramipexole (MIRAPEX) 0.25 MG tablet, Take 2 pills at 7 PM and 2 pills at 9 PM daily. (Patient taking differently: Take 0.5 mg by mouth 2 (two) times daily. Take 0.5mg  by mouth at 7 PM and  0.5mg  at 9 PM daily.), Disp: 360 tablet, Rfl: 3 .  rosuvastatin (CRESTOR) 20 MG tablet, Take 20 mg by mouth daily., Disp: , Rfl:  .  tamsulosin (FLOMAX) 0.4 MG CAPS capsule, Take 0.4 mg by mouth daily. , Disp: , Rfl:  .  Wound Dressings (MEDIHONEY WOUND/BURN DRESSING) GEL, Apply to affected are 3 times a week, and cover with sterile dressing., Disp: 1 Tube, Rfl: 5  Social History   Tobacco Use  Smoking Status Former Smoker  . Years: 1.00  . Types: Pipe  . Last attempt to quit: 07/08/1977  . Years since quitting: 41.2  Smokeless Tobacco Never Used    Allergies  Allergen Reactions  . Quinoline Yellow Ss [Yellow Dye #11]   . Codeine Nausea And Vomiting  . Tramadol Nausea Only   Objective:  There were no vitals filed for this visit. There is no height or weight on file to calculate BMI. Constitutional Well developed. Well nourished.  Vascular Foot warm and well perfused. Capillary refill normal to all digits.   Neurologic Normal speech. Oriented to person, place, and time. Epicritic sensation to light touch grossly present bilaterally.  Dermatologic Skin well coapted. Skin color normal. No drainage. No purulence.   Orthopedic: Tenderness to palpation noted about the surgical site.   Radiographs: None today. Assessment:   No diagnosis found. Plan:  Patient was evaluated and treated and all questions answered.  S/p foot surgery left -Progressing as expected post-operatively. -XR: None -WB Status: WBAT in in surgical shoe -Sutures: intact. -Medications: None refilled today -Foot redressed.  Return in about 1 week (around 10/20/2018) for Post-op, Left.

## 2018-10-16 ENCOUNTER — Encounter: Payer: Self-pay | Admitting: Podiatry

## 2018-10-16 ENCOUNTER — Telehealth: Payer: Self-pay | Admitting: Podiatry

## 2018-10-16 ENCOUNTER — Ambulatory Visit (INDEPENDENT_AMBULATORY_CARE_PROVIDER_SITE_OTHER): Payer: PPO | Admitting: Podiatry

## 2018-10-16 DIAGNOSIS — Z9889 Other specified postprocedural states: Secondary | ICD-10-CM

## 2018-10-16 DIAGNOSIS — M2042 Other hammer toe(s) (acquired), left foot: Secondary | ICD-10-CM

## 2018-10-16 NOTE — Telephone Encounter (Signed)
Noted thanks °

## 2018-10-16 NOTE — Telephone Encounter (Signed)
One of the pins from my hammertoe surgery has come out about one to two inches and its causing me pain.

## 2018-10-16 NOTE — Telephone Encounter (Signed)
Scheduler A. Tamala Julian states Dr. Amalia Hailey has 1:15pm today. I informed pt of the appt and he states he will be here.

## 2018-10-16 NOTE — Progress Notes (Signed)
[name] is here today to have his bandages changed, he is status post hammertoe repair second and third toes left foot, date of surgery 10/01/2018.  He says that his toes do not bother him, and he feels like some of the swelling has gone down.  Noted well-healing surgical sites, very minimal redness, no erythema, mild swelling, no drainage, all sutures were intact with no gapping.  Overall the areas appear to be healing well.  Redressed foot and dry sterile gauze, patient is to keep his follow-up appointment to see Dr. March Rummage this week.

## 2018-10-18 ENCOUNTER — Other Ambulatory Visit: Payer: Self-pay

## 2018-10-18 NOTE — Patient Outreach (Signed)
[facility]) Care Management  10/18/2018  JOHATHON OVERTURF Dec 28, 1945 [id number]   3rd attempt to outreach the patient for assessment. No answer.  HIPAA compliant voicemail left with contact information.  Plan: RN Health Coach will make another outreach attempt to the patient within thirty business days.  [name] RN, BSN, [place] Direct Dial:  [phone]  Fax: [phone]

## 2018-10-20 ENCOUNTER — Ambulatory Visit (INDEPENDENT_AMBULATORY_CARE_PROVIDER_SITE_OTHER): Payer: PPO | Admitting: Podiatry

## 2018-10-20 DIAGNOSIS — Z9889 Other specified postprocedural states: Secondary | ICD-10-CM

## 2018-10-21 NOTE — Progress Notes (Signed)
Subjective:  Patient ID: [name], male    DOB: 08-23-1946,  MRN: [id number]  Chief Complaint  Patient presents with  . Routine Post Op    dos 01.10.2020 Hammertoe Repair 2nd & 3rd Lt    DOS: 10/01/2018 Procedure: 2nd/3rd Hammertoe Repair with Buried Pin Fixation  73 y.o. male returns for post-op check.  Had pins removed earlier in the week. Denies other issues. Pain controlled.  Review of Systems: Negative except as noted in the HPI. Denies N/V/F/Ch.  Past Medical History:  Diagnosis Date  . Arthritis   . Cardiomyopathy- EF NL 01/2013, now 30-35% echo 03/14/2012  . Carotid artery occlusion    left s/p CEA  . CHF (congestive heart failure) ([place])   . Diabetes mellitus   . GERD (gastroesophageal reflux disease)   . HOH (hard of hearing)   . Hypertension   . Hypothyroidism   . Obstructive sleep apnea   . Orthostatic hypotension   . Osteomyelitis ([place])    left great toe  . Peripheral arterial disease ([place]), s/p PTA x 2 RLE    nonhealing ulcers bilaterally on each great toe  . Pneumonia   . PONV (postoperative nausea and vomiting)   . Restless leg syndrome   . Shortness of breath   . Stroke [facility]) March 08, 2012  . Wears dentures     Current Outpatient Medications:  .  aspirin EC 81 MG EC tablet, Take 1 tablet (81 mg total) by mouth daily. (Patient taking differently: Take 81 mg by mouth at bedtime. ), Disp: , Rfl:  .  b complex vitamins tablet, Take 1 tablet by mouth daily., Disp: , Rfl:  .  carvedilol (COREG) 3.125 MG tablet, TAKE ONE TABLET BY MOUTH TWICE A DAY WITH A MEAL, Disp: 180 tablet, Rfl: 3 .  cephALEXin (KEFLEX) 500 MG capsule, Take 1 capsule (500 mg total) by mouth 2 (two) times daily., Disp: 14 capsule, Rfl: 0 .  Cholecalciferol (VITAMIN D3) 5000 units TABS, Take 5,000 Units by mouth daily., Disp: , Rfl:  .  ciprofloxacin (CIPRO) 500 MG tablet, Take 1 tablet (500 mg total) by mouth 2 (two) times daily., Disp: 14 tablet, Rfl: 0 .  clindamycin (CLEOCIN) 300  MG capsule, Take 1 capsule (300 mg total) by mouth 2 (two) times daily., Disp: 14 capsule, Rfl: 0 .  clopidogrel (PLAVIX) 75 MG tablet, Take 75 mg by mouth daily. , Disp: , Rfl:  .  Continuous Blood Gluc Sensor (FREESTYLE LIBRE 14 DAY SENSOR) MISC, , Disp: , Rfl:  .  Cyanocobalamin (CVS B-12) 1500 MCG TBDP, Take 1,500 mcg by mouth daily. , Disp: , Rfl:  .  DULoxetine (CYMBALTA) 60 MG capsule, Take 60 mg by mouth at bedtime. , Disp: , Rfl:  .  Empagliflozin-linaGLIPtin (GLYXAMBI) 10-5 MG TABS, Take 1 tablet by mouth at bedtime., Disp: , Rfl:  .  furosemide (LASIX) 40 MG tablet, Take 1 tablet (40 mg total) by mouth 2 (two) times daily. Schedule appointment for refills (Patient taking differently: Take 40 mg by mouth daily. Schedule appointment for refills), Disp: 180 tablet, Rfl: 3 .  glimepiride (AMARYL) 4 MG tablet, Take 4 mg by mouth daily with breakfast., Disp: , Rfl:  .  HYDROcodone-acetaminophen (NORCO/VICODIN) 5-325 MG tablet, Take 1 tablet by mouth every 6 (six) hours as needed for moderate pain., Disp: 20 tablet, Rfl: 0 .  levothyroxine (SYNTHROID, LEVOTHROID) 75 MCG tablet, Take 75 mcg by mouth daily before breakfast. , Disp: , Rfl:  .  magnesium oxide (MAG-OX) 400 (241.3 Mg) MG tablet, Take 400 mg by mouth daily., Disp: , Rfl:  .  nitroGLYCERIN (NITROSTAT) 0.4 MG SL tablet, Place 1 tablet (0.4 mg total) under the tongue every 5 (five) minutes as needed for chest pain., Disp: 25 tablet, Rfl: 3 .  potassium chloride SA (K-DUR,KLOR-CON) 20 MEQ tablet, Take 1 tablet (20 mEq total) by mouth daily. Please schedule appointment for refills (Patient taking differently: Take 20 mEq by mouth every other day. Please schedule appointment for refills), Disp: 90 tablet, Rfl: 3 .  pramipexole (MIRAPEX) 0.25 MG tablet, Take 2 pills at 7 PM and 2 pills at 9 PM daily. (Patient taking differently: Take 0.5 mg by mouth 2 (two) times daily. Take 0.5mg  by mouth at 7 PM and 0.5mg  at 9 PM daily.), Disp: 360 tablet,  Rfl: 3 .  rosuvastatin (CRESTOR) 20 MG tablet, Take 20 mg by mouth daily., Disp: , Rfl:  .  tamsulosin (FLOMAX) 0.4 MG CAPS capsule, Take 0.4 mg by mouth daily. , Disp: , Rfl:  .  Wound Dressings (MEDIHONEY WOUND/BURN DRESSING) GEL, Apply to affected are 3 times a week, and cover with sterile dressing., Disp: 1 Tube, Rfl: 5  Social History   Tobacco Use  Smoking Status Former Smoker  . Years: 1.00  . Types: Pipe  . Last attempt to quit: 07/08/1977  . Years since quitting: 41.3  Smokeless Tobacco Never Used    Allergies  Allergen Reactions  . Quinoline Yellow Ss [Yellow Dye #11]   . Codeine Nausea And Vomiting  . Tramadol Nausea Only   Objective:  There were no vitals filed for this visit. There is no height or weight on file to calculate BMI. Constitutional Well developed. Well nourished.  Vascular Foot warm and well perfused. Capillary refill normal to all digits.   Neurologic Normal speech. Oriented to person, place, and time. Epicritic sensation to light touch grossly present bilaterally.  Dermatologic Skin well healed.  Orthopedic: Tenderness to palpation noted about the surgical site.   Radiographs: None today. Assessment:   1. Status post left foot surgery    Plan:  Patient was evaluated and treated and all questions answered.  S/p foot surgery left -Progressing as expected post-operatively. -XR: None -WB Status: WBAT in in surgical shoe -Sutures: removed today. Steris applied. -Medications: None refilled today -Foot redressed.  No follow-ups on file. XR at next visit.

## 2018-10-22 NOTE — Progress Notes (Signed)
   Subjective:  Patient presents today status post hammertoe repair digits 2 and 3 left. DOS: 09/29/2018. He states he is doing well. He is concerned about the pins coming out. He denies any significant pain or modifying factors at this time. He has been using the post op shoe as directed. Patient is here for further evaluation and treatment.    Past Medical History:  Diagnosis Date  . Arthritis   . Cardiomyopathy- EF NL 01/2013, now 30-35% echo 03/14/2012  . Carotid artery occlusion    left s/p CEA  . CHF (congestive heart failure) ([place])   . Diabetes mellitus   . GERD (gastroesophageal reflux disease)   . HOH (hard of hearing)   . Hypertension   . Hypothyroidism   . Obstructive sleep apnea   . Orthostatic hypotension   . Osteomyelitis ([place])    left great toe  . Peripheral arterial disease ([place]), s/p PTA x 2 RLE    nonhealing ulcers bilaterally on each great toe  . Pneumonia   . PONV (postoperative nausea and vomiting)   . Restless leg syndrome   . Shortness of breath   . Stroke [facility]) March 08, 2012  . Wears dentures       Objective/Physical Exam Neurovascular status intact.  Skin incisions appear to be well coapted with sutures and staples intact. No sign of infectious process noted. No dehiscence. No active bleeding noted. Moderate edema noted to the surgical extremity.  Assessment: 1. s/p hammertoe repair 2, 3 left. DOS: 09/29/2018   Plan of Care:  1. Patient was evaluated.  2. Percutaneous pins removed.  3. Continue weightbearing in post op shoe.  4. Return to clinic at next scheduled appointment with Dr. March Rummage.    Edrick Kins, DPM [facility]  Dr. Edrick Kins, [address]                                        [place], [place] [address]                Office [phone]  Fax [phone]

## 2018-10-26 ENCOUNTER — Ambulatory Visit: Payer: PPO | Admitting: Nurse Practitioner

## 2018-10-27 ENCOUNTER — Encounter: Payer: PPO | Admitting: Podiatry

## 2018-10-28 ENCOUNTER — Ambulatory Visit (INDEPENDENT_AMBULATORY_CARE_PROVIDER_SITE_OTHER): Payer: PPO | Admitting: Podiatry

## 2018-10-28 ENCOUNTER — Ambulatory Visit (INDEPENDENT_AMBULATORY_CARE_PROVIDER_SITE_OTHER): Payer: PPO

## 2018-10-28 DIAGNOSIS — M2042 Other hammer toe(s) (acquired), left foot: Secondary | ICD-10-CM

## 2018-10-28 DIAGNOSIS — Z9889 Other specified postprocedural states: Secondary | ICD-10-CM

## 2018-11-01 NOTE — Progress Notes (Signed)
Encounter created in error

## 2018-11-07 ENCOUNTER — Other Ambulatory Visit: Payer: Self-pay

## 2018-11-07 NOTE — Patient Outreach (Signed)
[facility]) Care Management  11/07/2018  [name] 06-28-1946 [id number]    4th outreach attempt to the patient. No answer.  The phone rang six times with no pick up.  Unable to leave a message.  Plan: RN Health Coach will make outreach attempt the patient within thirty business days.   [name] RN, BSN, [place] Direct Dial:  [phone]  Fax: [phone]

## 2018-11-09 ENCOUNTER — Ambulatory Visit (INDEPENDENT_AMBULATORY_CARE_PROVIDER_SITE_OTHER): Payer: PPO | Admitting: Podiatry

## 2018-11-09 DIAGNOSIS — Z9889 Other specified postprocedural states: Secondary | ICD-10-CM

## 2018-11-20 NOTE — Progress Notes (Signed)
Subjective:  Patient ID: [name], male    DOB: [birth year],  MRN: [id number]  Chief Complaint  Patient presents with  . Routine Post Op     dos 01.10.2020 Hammertoe Repair 2nd & 3rd Lt   DOS: 10/01/2018 Procedure: 2nd/3rd Hammertoe Repair with Buried Pin Fixation  73 y.o. male returns for post-op check.  Doing well having difficulty wearing her surgical shoe excited to but regular shoe on  Review of Systems: Negative except as noted in the HPI. Denies N/V/F/Ch.  Past Medical History:  Diagnosis Date  . Arthritis   . Cardiomyopathy- EF NL 01/2013, now 30-35% echo 03/14/2012  . Carotid artery occlusion    left s/p CEA  . CHF (congestive heart failure) ([place])   . Diabetes mellitus   . GERD (gastroesophageal reflux disease)   . HOH (hard of hearing)   . Hypertension   . Hypothyroidism   . Obstructive sleep apnea   . Orthostatic hypotension   . Osteomyelitis ([place])    left great toe  . Peripheral arterial disease ([place]), s/p PTA x 2 RLE    nonhealing ulcers bilaterally on each great toe  . Pneumonia   . PONV (postoperative nausea and vomiting)   . Restless leg syndrome   . Shortness of breath   . Stroke [facility]) March 08, 2012  . Wears dentures     Current Outpatient Medications:  .  aspirin EC 81 MG EC tablet, Take 1 tablet (81 mg total) by mouth daily. (Patient taking differently: Take 81 mg by mouth at bedtime. ), Disp: , Rfl:  .  b complex vitamins tablet, Take 1 tablet by mouth daily., Disp: , Rfl:  .  carvedilol (COREG) 3.125 MG tablet, TAKE ONE TABLET BY MOUTH TWICE A DAY WITH A MEAL, Disp: 180 tablet, Rfl: 3 .  Cholecalciferol (VITAMIN D3) 5000 units TABS, Take 5,000 Units by mouth daily., Disp: , Rfl:  .  clopidogrel (PLAVIX) 75 MG tablet, Take 75 mg by mouth daily. , Disp: , Rfl:  .  Continuous Blood Gluc Sensor (FREESTYLE LIBRE 14 DAY SENSOR) MISC, , Disp: , Rfl:  .  Cyanocobalamin (CVS B-12) 1500 MCG TBDP, Take 1,500 mcg by mouth daily. , Disp: , Rfl:  .   DULoxetine (CYMBALTA) 60 MG capsule, Take 60 mg by mouth at bedtime. , Disp: , Rfl:  .  Empagliflozin-linaGLIPtin (GLYXAMBI) 10-5 MG TABS, Take 1 tablet by mouth at bedtime., Disp: , Rfl:  .  furosemide (LASIX) 40 MG tablet, Take 1 tablet (40 mg total) by mouth 2 (two) times daily. Schedule appointment for refills (Patient taking differently: Take 40 mg by mouth daily. Schedule appointment for refills), Disp: 180 tablet, Rfl: 3 .  glimepiride (AMARYL) 4 MG tablet, Take 4 mg by mouth daily with breakfast., Disp: , Rfl:  .  HYDROcodone-acetaminophen (NORCO/VICODIN) 5-325 MG tablet, Take 1 tablet by mouth every 6 (six) hours as needed for moderate pain., Disp: 20 tablet, Rfl: 0 .  levothyroxine (SYNTHROID, LEVOTHROID) 75 MCG tablet, Take 75 mcg by mouth daily before breakfast. , Disp: , Rfl:  .  magnesium oxide (MAG-OX) 400 (241.3 Mg) MG tablet, Take 400 mg by mouth daily., Disp: , Rfl:  .  nitroGLYCERIN (NITROSTAT) 0.4 MG SL tablet, Place 1 tablet (0.4 mg total) under the tongue every 5 (five) minutes as needed for chest pain., Disp: 25 tablet, Rfl: 3 .  potassium chloride SA (K-DUR,KLOR-CON) 20 MEQ tablet, Take 1 tablet (20 mEq total) by mouth daily. Please schedule appointment  for refills (Patient taking differently: Take 20 mEq by mouth every other day. Please schedule appointment for refills), Disp: 90 tablet, Rfl: 3 .  pramipexole (MIRAPEX) 0.25 MG tablet, Take 2 pills at 7 PM and 2 pills at 9 PM daily. (Patient taking differently: Take 0.5 mg by mouth 2 (two) times daily. Take 0.5mg  by mouth at 7 PM and 0.5mg  at 9 PM daily.), Disp: 360 tablet, Rfl: 3 .  rosuvastatin (CRESTOR) 20 MG tablet, Take 20 mg by mouth daily., Disp: , Rfl:  .  tamsulosin (FLOMAX) 0.4 MG CAPS capsule, Take 0.4 mg by mouth daily. , Disp: , Rfl:  .  Wound Dressings (MEDIHONEY WOUND/BURN DRESSING) GEL, Apply to affected are 3 times a week, and cover with sterile dressing., Disp: 1 Tube, Rfl: 5  Social History   Tobacco Use    Smoking Status Former Smoker  . Years: 1.00  . Types: Pipe  . Last attempt to quit: 07/08/1977  . Years since quitting: 41.3  Smokeless Tobacco Never Used    Allergies  Allergen Reactions  . Quinoline Yellow Ss [Yellow Dye #11]   . Codeine Nausea And Vomiting  . Tramadol Nausea Only   Objective:  There were no vitals filed for this visit. There is no height or weight on file to calculate BMI. Constitutional Well developed. Well nourished.  Vascular Foot warm and well perfused. Capillary refill normal to all digits.   Neurologic Normal speech. Oriented to person, place, and time. Epicritic sensation to light touch grossly present bilaterally.  Dermatologic Skin well healed.  Orthopedic: No tenderness to palpation noted about the surgical site.   Radiographs: Taken and reviewed.  Arthrodesis sites appear to be bridging slight residual contracture second toe Assessment:   1. Status post left foot surgery   2. Hammer toe of left foot    Plan:  Patient was evaluated and treated and all questions answered.  S/p foot surgery left -Progressing as expected post-operatively. -XR: None -WB Status: WBAT in in normal shoes -Skin healed -Medications: None refilled today -Foot redressed.  Follow-up in 2 weeks for recheck

## 2018-11-20 NOTE — Progress Notes (Signed)
Subjective:  Patient ID: [name], male    DOB: December 12, 1945,  MRN: [id number]  Chief Complaint  Patient presents with  . Routine Post Op    dos 01.10.2020 Hammertoe Repair 2nd & 47rd Lt, pt's foot is feeling better, pt is not taking any medications, for his foot,pt feels fine when he is walking   DOS: 10/01/2018 Procedure: 2nd/3rd Hammertoe Repair with Buried Pin Fixation  73 y.o. male returns for post-op check.  States that his toes feel better having no pain when he wears his shoes.  Review of Systems: Negative except as noted in the HPI. Denies N/V/F/Ch.  Past Medical History:  Diagnosis Date  . Arthritis   . Cardiomyopathy- EF NL 01/2013, now 30-35% echo 03/14/2012  . Carotid artery occlusion    left s/p CEA  . CHF (congestive heart failure) ([place])   . Diabetes mellitus   . GERD (gastroesophageal reflux disease)   . HOH (hard of hearing)   . Hypertension   . Hypothyroidism   . Obstructive sleep apnea   . Orthostatic hypotension   . Osteomyelitis ([place])    left great toe  . Peripheral arterial disease ([place]), s/p PTA x 2 RLE    nonhealing ulcers bilaterally on each great toe  . Pneumonia   . PONV (postoperative nausea and vomiting)   . Restless leg syndrome   . Shortness of breath   . Stroke [facility]) March 08, 2012  . Wears dentures     Current Outpatient Medications:  .  aspirin EC 81 MG EC tablet, Take 1 tablet (81 mg total) by mouth daily. (Patient taking differently: Take 81 mg by mouth at bedtime. ), Disp: , Rfl:  .  b complex vitamins tablet, Take 1 tablet by mouth daily., Disp: , Rfl:  .  carvedilol (COREG) 3.125 MG tablet, TAKE ONE TABLET BY MOUTH TWICE A DAY WITH A MEAL, Disp: 180 tablet, Rfl: 3 .  Cholecalciferol (VITAMIN D3) 5000 units TABS, Take 5,000 Units by mouth daily., Disp: , Rfl:  .  clopidogrel (PLAVIX) 75 MG tablet, Take 75 mg by mouth daily. , Disp: , Rfl:  .  Continuous Blood Gluc Sensor (FREESTYLE LIBRE 14 DAY SENSOR) MISC, , Disp: , Rfl:  .   Cyanocobalamin ([employer] B-12) 1500 MCG TBDP, Take 1,500 mcg by mouth daily. , Disp: , Rfl:  .  DULoxetine (CYMBALTA) 60 MG capsule, Take 60 mg by mouth at bedtime. , Disp: , Rfl:  .  Empagliflozin-linaGLIPtin (GLYXAMBI) 10-5 MG TABS, Take 1 tablet by mouth at bedtime., Disp: , Rfl:  .  furosemide (LASIX) 40 MG tablet, Take 1 tablet (40 mg total) by mouth 2 (two) times daily. Schedule appointment for refills (Patient taking differently: Take 40 mg by mouth daily. Schedule appointment for refills), Disp: 180 tablet, Rfl: 3 .  glimepiride (AMARYL) 4 MG tablet, Take 4 mg by mouth daily with breakfast., Disp: , Rfl:  .  HYDROcodone-acetaminophen (NORCO/VICODIN) 5-325 MG tablet, Take 1 tablet by mouth every 6 (six) hours as needed for moderate pain., Disp: 20 tablet, Rfl: 0 .  levothyroxine (SYNTHROID, LEVOTHROID) 75 MCG tablet, Take 75 mcg by mouth daily before breakfast. , Disp: , Rfl:  .  magnesium oxide (MAG-OX) 400 (241.3 Mg) MG tablet, Take 400 mg by mouth daily., Disp: , Rfl:  .  nitroGLYCERIN (NITROSTAT) 0.4 MG SL tablet, Place 1 tablet (0.4 mg total) under the tongue every 5 (five) minutes as needed for chest pain., Disp: 25 tablet, Rfl: 3 .  potassium chloride SA (K-DUR,KLOR-CON) 20 MEQ tablet, Take 1 tablet (20 mEq total) by mouth daily. Please schedule appointment for refills (Patient taking differently: Take 20 mEq by mouth every other day. Please schedule appointment for refills), Disp: 90 tablet, Rfl: 3 .  pramipexole (MIRAPEX) 0.25 MG tablet, Take 2 pills at 7 PM and 2 pills at 9 PM daily. (Patient taking differently: Take 0.5 mg by mouth 2 (two) times daily. Take 0.5mg  by mouth at 7 PM and 0.5mg  at 9 PM daily.), Disp: 360 tablet, Rfl: 3 .  rosuvastatin (CRESTOR) 20 MG tablet, Take 20 mg by mouth daily., Disp: , Rfl:  .  tamsulosin (FLOMAX) 0.4 MG CAPS capsule, Take 0.4 mg by mouth daily. , Disp: , Rfl:  .  Wound Dressings (MEDIHONEY WOUND/BURN DRESSING) GEL, Apply to affected are 3 times a week,  and cover with sterile dressing., Disp: 1 Tube, Rfl: 5  Social History   Tobacco Use  Smoking Status Former Smoker  . Years: 1.00  . Types: Pipe  . Last attempt to quit: 07/08/1977  . Years since quitting: 41.3  Smokeless Tobacco Never Used    Allergies  Allergen Reactions  . Quinoline Yellow Ss [Yellow Dye #11]   . Codeine Nausea And Vomiting  . Tramadol Nausea Only   Objective:  There were no vitals filed for this visit. There is no height or weight on file to calculate BMI. Constitutional Well developed. Well nourished.  Vascular Foot warm and well perfused. Capillary refill normal to all digits.   Neurologic Normal speech. Oriented to person, place, and time. Epicritic sensation to light touch grossly present bilaterally.  Dermatologic Skin well healed.  Orthopedic:  No tenderness to palpation noted about the surgical site.  Slight contracture still however no distal callus or ulceration   Radiographs: None today. Assessment:   1. Status post left foot surgery    Plan:  Patient was evaluated and treated and all questions answered.  S/p foot surgery left -Progressing as expected post-operatively. -XR: None -WB Status: WBAT in in normal shoe gear -Skin healed -Medications: None refilled today -Foot redressed.  Return in about 1 month (around 12/08/2018) for Post-op.

## 2018-11-22 ENCOUNTER — Telehealth: Payer: Self-pay | Admitting: Podiatry

## 2018-11-22 NOTE — Telephone Encounter (Signed)
Left message for pt that we received a letter from Dr Luan Pulling that pt needs an appt for them to fill out the diabetic shoe paperwork and to call when appt is scheduled and I will resend paperwork.

## 2018-12-08 ENCOUNTER — Other Ambulatory Visit: Payer: Self-pay

## 2018-12-08 ENCOUNTER — Other Ambulatory Visit: Payer: Self-pay | Admitting: Pulmonary Disease

## 2018-12-08 ENCOUNTER — Ambulatory Visit (HOSPITAL_COMMUNITY)
Admission: RE | Admit: 2018-12-08 | Discharge: 2018-12-08 | Disposition: A | Discharge status: Home / Self Care | Payer: PPO | Source: Ambulatory Visit | Attending: Pulmonary Disease | Admitting: Pulmonary Disease

## 2018-12-08 ENCOUNTER — Other Ambulatory Visit (HOSPITAL_COMMUNITY): Payer: Self-pay | Admitting: Pulmonary Disease

## 2018-12-08 DIAGNOSIS — E1165 Type 2 diabetes mellitus with hyperglycemia: Secondary | ICD-10-CM | POA: Diagnosis not present

## 2018-12-08 DIAGNOSIS — R29810 Facial weakness: Secondary | ICD-10-CM | POA: Diagnosis not present

## 2018-12-08 DIAGNOSIS — G311 Senile degeneration of brain, not elsewhere classified: Secondary | ICD-10-CM | POA: Diagnosis not present

## 2018-12-08 DIAGNOSIS — I10 Essential (primary) hypertension: Secondary | ICD-10-CM | POA: Diagnosis not present

## 2018-12-08 NOTE — Patient Outreach (Addendum)
[facility]) Care Management  12/08/2018  [name] 15-Mar-1946 [id number]    5th  Unsuccessful outreach attempt to the patient.  No answer HIPAA compliant voicemail left with contact information.  Plan: RN Health Coach will make another outreach attempt to the patient within 64 business days.  [name] RN, BSN, [place] Direct Dial:  [phone]  Fax: [phone]

## 2018-12-14 ENCOUNTER — Encounter: Payer: PPO | Admitting: Podiatry

## 2018-12-21 ENCOUNTER — Encounter: Payer: PPO | Admitting: Podiatry

## 2018-12-22 NOTE — Progress Notes (Signed)
Erroneous encounter, please disregard

## 2018-12-25 DIAGNOSIS — Z0000 Encounter for general adult medical examination without abnormal findings: Secondary | ICD-10-CM | POA: Diagnosis not present

## 2018-12-25 DIAGNOSIS — R05 Cough: Secondary | ICD-10-CM | POA: Diagnosis not present

## 2019-01-01 ENCOUNTER — Telehealth: Payer: Self-pay

## 2019-01-01 NOTE — Telephone Encounter (Signed)
Due to current COVID 19 pandemic, our office is severely reducing in office visits for at least the next 2 weeks, in order to minimize the risk to our patients and healthcare providers.   Jinny Blossom, NP is out of the office on 01/18/19. I called pt to discuss converting to a virtual visit with Dr. Rexene Alberts. No answer, left a message asking him to call me back. If pt calls back, please discuss this with him and obtain consent for the virtual visit.

## 2019-01-01 NOTE — Telephone Encounter (Signed)
I called pt. He consented to a virtual visit on 01/18/19 at 10:30an with Dr. Rexene Alberts.  Pt understands that although there may be some limitations with this type of visit, we will take all precautions to reduce any security or privacy concerns.  Pt understands that this will be treated like an in office visit and we will file with pt's insurance, and there may be a patient responsible charge related to this service.  Pt's email is [email or web address]. Pt understands that the [employer] webex software must be downloaded and operational on the device pt plans to use for the visit.  Pt's meds, allergies, and PMH were updated.  Pt reports that he is quite restless at night. Otherwise, the pramipexole is going well.  Pt reports that his recent weight is 217 lbs and he is 5'11.75.

## 2019-01-01 NOTE — Telephone Encounter (Signed)
Pt gave consent to virtual visit  Email- [email or web address]

## 2019-01-17 NOTE — Telephone Encounter (Signed)
Patient needs help with trouble shooting please call him on his land line.

## 2019-01-17 NOTE — Telephone Encounter (Signed)
Called patient back and we converted virtual visit to [email or web address]. I spoke with patient for 15 minutes and walked him through the process. Patient verbalized understanding of the process.

## 2019-01-18 ENCOUNTER — Ambulatory Visit (INDEPENDENT_AMBULATORY_CARE_PROVIDER_SITE_OTHER): Payer: PPO | Admitting: Neurology

## 2019-01-18 ENCOUNTER — Other Ambulatory Visit: Payer: Self-pay

## 2019-01-18 ENCOUNTER — Ambulatory Visit: Payer: PPO | Admitting: Adult Health

## 2019-01-18 DIAGNOSIS — Z0289 Encounter for other administrative examinations: Secondary | ICD-10-CM

## 2019-01-18 NOTE — Telephone Encounter (Signed)
Pt was unable to unfreeze the screen for the [email or web address] VV.  Dr Rexene Alberts made the suggestion that pt. Reschedule, pt agreed and is now rescheduled for  05-06 @10 :30.

## 2019-01-24 ENCOUNTER — Other Ambulatory Visit: Payer: Self-pay

## 2019-01-24 ENCOUNTER — Ambulatory Visit (INDEPENDENT_AMBULATORY_CARE_PROVIDER_SITE_OTHER): Payer: PPO | Admitting: Neurology

## 2019-01-24 ENCOUNTER — Encounter: Payer: Self-pay | Admitting: Neurology

## 2019-01-24 DIAGNOSIS — G2581 Restless legs syndrome: Secondary | ICD-10-CM | POA: Diagnosis not present

## 2019-01-24 DIAGNOSIS — G4761 Periodic limb movement disorder: Secondary | ICD-10-CM | POA: Diagnosis not present

## 2019-01-24 MED ORDER — PRAMIPEXOLE DIHYDROCHLORIDE 0.25 MG PO TABS: 0.5000 mg | ORAL_TABLET | Freq: Two times a day (BID) | ORAL | 3 refills | Status: DC

## 2019-01-24 NOTE — Progress Notes (Signed)
Interim history:  [name] is a 73 year old left-handed gentleman with an underlying complex medical history of cardiomyopathy, carotid artery occlusion and status post left carotid endarterectomy, orthostatic hypotension, obesity, hypertension, reflux disease, low back pain, type 2 diabetes, history of stroke, hypothyroidism, peripheral vascular disease, status post back surgery and status post neck surgery, who presents for a virtual, phone based appointment via phone call ([email or web address] did not work for him, had computer problems), for follow-up consultation of his long-standing history of RLS. The patient is accompanied by his wife today and joins via home phone from his home, wife picked up phone and had no additional questions later, I am in my office. I last saw him on 04/19/2018, at which time he had some symptoms of RLS affecting his arms. He had a recent fall. He had seen ophthalmology for double vision. He had a recent brain and orbital MRI which I was able to review. He had an old lacunar infarct in the posterior medial right side of the pons, orbital MRI was unremarkable. He was at the time and Requip as well as Mirapex, he was not able to tell me how long he was on both medications. He was strongly advised not to take both dopamine agonists. He had some swelling of the lower extremities.  He had interim complications for left leg cellulitis and osteomyelitis of the left foot, had partial amputation of the left big toe.  Today, 01/24/2019: Please also see below for virtual visit documentation.    The patient's allergies, current medications, family history, past medical history, past social history, past surgical history and problem list were reviewed and updated as appropriate.    Previously (copied from previous notes for reference):    Of note, he no showed for an appointment on 02/15/2018. I last saw him on 08/18/2017, at which time he reported that at times it felt that his whole  body wanted to twitch. He tapered off the gabapentin which he felt was better. Adding the Mirapex in the evening helped. He had some lightheadedness upon standing. He was advised to make sure he drinks enough water was advised to consider using compression stockings. In addition, he was advised to increase his Mirapex to 2 pills at 7 PM and 2 pills at 9 PM, 0.25 mg strength each.     I saw him on 05/18/2017, at which time he felt the Mirapex was helping only temporarily. He was also on gabapentin 600 mg at night. He is trying to take the Mirapex about 2 hours before his bedtime. He was tolerating Mirapex at 0.5 mg each night. His lower extremity swelling was about the same. He was planning on being compliant with his CPAP machine. I suggested he taper off the gabapentin. In lieu of that I increased his pramipexole to 0.25 mg at 7 PM and 0.5 mg at 9 PM.    I first met him on 02/15/2017 at the request of his primary care physician, at which time he reported a likely 30 year plus history of restless leg syndrome. He had been on high-dose gabapentin but stopped it on his own. He was on low-dose Mirapex and I suggested we increase this. He was advised to go back on his CPAP for sleep apnea. He was also advised to reduce his caffeine intake.   02/15/2017: (He) reports restless legs symptoms for the past many years, likely over 30 years. I reviewed your office note from 01/26/2017, which you kindly included. He has been on  Mirapex 0.25 mg strength for the past months, maybe since 12/17. He is also Cymbalta 60 mg twice daily. He presented to the emergency room recently on 01/25/2017 for a near syncopal event. I reviewed the emergency room records. Symptoms were suspected to be secondary to volume depletion and he improved subjectively with fluids. He was recently tried on gabapentin, but felt worse and stopped it. His diabetic PN symptoms are worse since he stopped it, he was on 600 mg 4 times a day and stopped it  all at once - I advised him never to stop this med "cold [place]".  He is a non-smoker, does not currently drinking alcohol currently, but drinks a significant amount of caffeine in the form of soda/cola, ("lots") 3 x 44 oz per day, by self report. He reports feeling restless in his body.  He has not used his CPAP in over 2 months, waiting for a cleaning machine for the CPAP. He had sleep study testing last year, he believes, but then recalls it was in 2014. I reviewed His sleep study report from 08/03/2013. He had a baseline sleep study at the [facility] heart [facility] sleep [facility]. Sleep efficiency was only 60%, REM latency was 99 minutes, overall AHI was 26 per hour, average oxygen saturation was 90%, nadir was 77% during REM sleep. He had severe PLMS with an index of 127.7 per hour, PLM arousal index was elevated at 52.9 per hour. He recalls that he went back for a second sleep study for CPAP titration. Of note, he has noticed worsening of RLS symptoms since coming off of the CPAP. He has severe nocturia, about 5 times per night, takes his diuretic at night, although he is supposed to take it in the morning. He has never seen a urologist. He works at [employer], outside Pharmacologist, on the road a lot, therefore he does not like to take his diuretic during the day as he does not have access to bathrooms frequently. His bedtime is around 10 PM, he tries to take his restless legs medication an hour before. Wake up time is around 9 AM.    His Past Medical History Is Significant For: Past Medical History:  Diagnosis Date  . Arthritis   . Cardiomyopathy- EF NL 01/2013, now 30-35% echo 03/14/2012  . Carotid artery occlusion    left s/p CEA  . CHF (congestive heart failure) ([place])   . Diabetes mellitus   . GERD (gastroesophageal reflux disease)   . HOH (hard of hearing)   . Hypertension   . Hypothyroidism   . Obstructive sleep apnea   . Orthostatic hypotension   . Osteomyelitis ([place])    left great toe  .  Peripheral arterial disease ([place]), s/p PTA x 2 RLE    nonhealing ulcers bilaterally on each great toe  . Pneumonia   . PONV (postoperative nausea and vomiting)   . Restless leg syndrome   . Shortness of breath   . Stroke [facility]) March 08, 2012  . Wears dentures     His Past Surgical History Is Significant For: Past Surgical History:  Procedure Laterality Date  . AMPUTATION TOE Left 07/25/2018   Procedure: AMPUTATION TOE INTERPHALANGEAL HALLUX LEFT;  Surgeon: Evelina Bucy, DPM;  Location: [place];  Service: Podiatry;  Laterality: Left;  . ANGIOPLASTY  02/28/14   diamond back orbital rotational atherectomy of Rt. tibial  . BACK SURGERY    . BONE BIOPSY Left 07/25/2018   Procedure: SUPERFICIAL BONE BIOPSY;  Surgeon: March Rummage,  Christian Mate, DPM;  Location: [place];  Service: Podiatry;  Laterality: Left;  . CARDIAC CATHETERIZATION N/A 05/19/2016   Procedure: Right/Left Heart Cath and Coronary Angiography;  Surgeon: Belva Crome, MD;  Location: [place] CV LAB;  Service: Cardiovascular;  Laterality: N/A;  . CERVICAL FUSION    . ENDARTERECTOMY Left 11/29/2013   Procedure: ENDARTERECTOMY CAROTID;  Surgeon: Serafina Mitchell, MD;  Location: [place];  Service: Vascular;  Laterality: Left;  . EYE SURGERY    . FOOT SURGERY    . Lower ext duplex doppler  03/14/14   Rt ABI 1.2  . LOWER EXTREMITY ANGIOGRAM Bilateral 02/18/2014   Procedure: LOWER EXTREMITY ANGIOGRAM;  Surgeon: [name], MD;  Location: [facility] CATH LAB;  Service: Cardiovascular;  Laterality: Bilateral;  . LOWER EXTREMITY ANGIOGRAM N/A 10/31/2014   Procedure: LOWER EXTREMITY ANGIOGRAM;  Surgeon: [name], MD;  Location: [facility] CATH LAB;  Service: Cardiovascular;  Laterality: N/A;  . MULTIPLE TOOTH EXTRACTIONS    . PV angiogram  02/18/2014   tibial vessel diseas bil.  [name] SPINE SURGERY    . tendon achillies lengthing and sesamoid      His Family History Is Significant For: Family History  Problem Relation Age of Onset  . Heart disease  Mother   . Hypertension Mother   . Heart attack Mother   . Hypertension Father   . Diabetes Father   . Diabetes Son   . Heart disease Son   . Hypertension Son     His Social History Is Significant For: Social History   Socioeconomic History  . Marital status: Married    Spouse name: Not on file  . Number of children: Not on file  . Years of education: college  . Highest education level: Not on file  Occupational History  . Occupation: Firefighter   Social Needs  . Financial resource strain: Not on file  . Food insecurity:    Worry: Not on file    Inability: Not on file  . Transportation needs:    Medical: Not on file    Non-medical: Not on file  Tobacco Use  . Smoking status: Former Smoker    Years: 1.00    Types: Pipe    Last attempt to quit: 07/08/1977    Years since quitting: 41.5  . Smokeless tobacco: Never Used  Substance and Sexual Activity  . Alcohol use: No    Alcohol/week: 0.0 standard drinks  . Drug use: No  . Sexual activity: Not on file  Lifestyle  . Physical activity:    Days per week: Not on file    Minutes per session: Not on file  . Stress: Not on file  Relationships  . Social connections:    Talks on phone: Not on file    Gets together: Not on file    Attends religious service: Not on file    Active member of club or organization: Not on file    Attends meetings of clubs or organizations: Not on file    Relationship status: Not on file  Other Topics Concern  . Not on file  Social History Narrative   Drinks 3 44oz cokes a day     His Allergies Are:  Allergies  Allergen Reactions  . Quinoline Yellow Ss [Yellow Dye #11]   . Codeine Nausea And Vomiting  . Tramadol Nausea Only  :   His Current Medications Are:  Outpatient Encounter Medications as of 01/24/2019  Medication Sig  .  aspirin EC 81 MG EC tablet Take 1 tablet (81 mg total) by mouth daily. (Patient taking differently: Take 81 mg by mouth at bedtime. )  . b complex vitamins  tablet Take 1 tablet by mouth daily.  . carvedilol (COREG) 3.125 MG tablet TAKE ONE TABLET BY MOUTH TWICE A DAY WITH A MEAL  . Cholecalciferol (VITAMIN D3) 5000 units TABS Take 5,000 Units by mouth daily.  . clopidogrel (PLAVIX) 75 MG tablet Take 75 mg by mouth daily.   . Continuous Blood Gluc Sensor (FREESTYLE LIBRE 14 DAY SENSOR) MISC   . Cyanocobalamin ([employer] B-12) 1500 MCG TBDP Take 1,500 mcg by mouth daily.   . DULoxetine (CYMBALTA) 60 MG capsule Take 60 mg by mouth at bedtime.   . Empagliflozin-linaGLIPtin (GLYXAMBI) 10-5 MG TABS Take 1 tablet by mouth at bedtime.  . furosemide (LASIX) 40 MG tablet Take 1 tablet (40 mg total) by mouth 2 (two) times daily. Schedule appointment for refills (Patient taking differently: Take 40 mg by mouth daily. Schedule appointment for refills)  . glimepiride (AMARYL) 4 MG tablet Take 4 mg by mouth daily with breakfast.  . HYDROcodone-acetaminophen (NORCO/VICODIN) 5-325 MG tablet Take 1 tablet by mouth every 6 (six) hours as needed for moderate pain.  [name] levothyroxine (SYNTHROID, LEVOTHROID) 75 MCG tablet Take 75 mcg by mouth daily before breakfast.   . nitroGLYCERIN (NITROSTAT) 0.4 MG SL tablet Place 1 tablet (0.4 mg total) under the tongue every 5 (five) minutes as needed for chest pain.  . potassium chloride SA (K-DUR,KLOR-CON) 20 MEQ tablet Take 1 tablet (20 mEq total) by mouth daily. Please schedule appointment for refills (Patient taking differently: Take 20 mEq by mouth every other day. Please schedule appointment for refills)  . pramipexole (MIRAPEX) 0.25 MG tablet Take 2 pills at 7 PM and 2 pills at 9 PM daily. (Patient taking differently: Take 0.5 mg by mouth 2 (two) times daily. Take 0.46m by mouth at 7 PM and 0.558mat 9 PM daily.)  . rosuvastatin (CRESTOR) 20 MG tablet Take 20 mg by mouth daily.  . tamsulosin (FLOMAX) 0.4 MG CAPS capsule Take 0.4 mg by mouth daily.   . Wound Dressings (MEDIHONEY WOUND/BURN DRESSING) GEL Apply to affected are 3 times a  week, and cover with sterile dressing.   No facility-administered encounter medications on file as of 01/24/2019.   :  Review of Systems:  Out of a complete 14 point review of systems, all are reviewed and negative with the exception of these symptoms as listed below:  Virtual Visit via Telephone Note on 01/24/19:   I connected with@ on 01/24/19 at 10:30 AM EDT by telephone and verified that I am speaking with the correct person using two identifiers.   I discussed the limitations, risks, security and privacy concerns of performing an evaluation and management service by telephone and the availability of in person appointments. I also discussed with the patient that there may be a patient responsible charge related to this service. The patient expressed understanding and agreed to proceed.   History of Present Illness: He reports feeling stable for the most part. Takes Mirapex 2 times in the evening, at 7 PM and 9 PM, 0.5 mg each time. He continues to have lower extremity swelling which fluctuates, he takes Lasix generic 40 mg twice a day. He does take Cymbalta in the evening. His partial left big toe amputation healed well. His restless leg symptoms fluctuate, definitely tend to be worse in the evening and 2 times  out of the month he has a bad night, otherwise generally speaking he would consider himself stable in that regard. He does try to hydrate well with water. He drinks caffeine in the form of coffee, he cut back. He drinks 3 cups of coffee daily and typically no daily soda and no tea. He tries to stay active physically, some yard work, tries to walk, is planning to walk more regularly now.   Observations/Objective:  There are no recent vital signs available for my review in his chart. The most recent vital signs are from 07/25/2018.  On examination, he is in no acute distress. Comprehension is good, no obvious language problems, speech is clear without dysarthria, slight slowness in speech  but otherwise unremarkable. Able to give his history without problems.   Assessment and Plan:  In summary, [name] a very pleasant 73 year old male with an underlyingcomplex medical historyof cardiomyopathy, carotid artery occlusion, with s/p L CEA, orthostatic hypotension, obesity, hypertension, reflux disease, low back pain, type 2 diabetes, history of stroke, hypothyroidism, peripheral vascular disease, status post back surgery and status post neck surgery, whopresents for a virtual, ph basedfollow-up consultation of his long-standing history of RLS and PLMs. He could not tolerate gabapentin. He has been on Mirapex 0.25 mg strength 2 pills at 7 PM and 2 pills at 9 PM which has helped his leg symptoms. He does have a flareup about 2 times a month and generally speaking his symptoms are worse in the evenings. Confirms that he is not actually taking ropinirole any longer. He has had symptoms in the upper body as well. When he has a flareup twice a month or so he feels that his entire body is restless at night. He is advised to stay active physically, well-hydrated and continue with the Mirapex at the current dose. He has had lower extremity swelling and I would not recommend increasing the Mirapex for fear of escalating the swelling. Furthermore, he is advised to try to take his Cymbalta in the day, rather than at night, should he feel significant daytime somnolence or adverse symptoms otherwise by taking it in the daytime, he has to revert back to taking it at night but he is advised that Cymbalta may exacerbate his restless leg symptoms. I renewed his 90 day prescription for Mirapex. I would like for him to make a follow-up appointment for 6 months with one of our nurse practitioners. I answered all his questions today and he was in agreement.   Follow Up Instructions: 1. Continue Mirapex 0.25 mg strength 2 pills at 7 PM and 2 pills at 9 PM. Prescription was renewed for 90 days with  refills at his listed pharmacy which was verified. I would not recommend increasing the dose at this time because he has lower extremity swelling. We talked about triggers for restless legs. He is encouraged to stay well-hydrated. 2. Consider switching Cymbalta to daytime rather than bedtime as antidepressants can sometimes exacerbate restless leg symptoms. 3. Follow-up in clinic for face-to-face visit in 6 months, with NP. 4. Call or email through My Chart for any interim questions or concerns.     I discussed the assessment and treatment plan with the patient. The patient was provided an opportunity to ask questions and all were answered. The patient agreed with the plan and demonstrated an understanding of the instructions.   The patient was advised to call back or seek an in-person evaluation if thmonth and taking your medicatione symptoms worsen or if  the condition fails to improve as anticipated.  I provided 12 minutes of non-face-to-face time during this encounter.   Star Age, MD

## 2019-01-24 NOTE — Patient Instructions (Signed)
Given over the phone during today's phone call virtual visit.  

## 2019-02-08 ENCOUNTER — Other Ambulatory Visit: Payer: Self-pay

## 2019-02-08 NOTE — Patient Outreach (Signed)
[place] [facility]) Care Management  02/08/2019  RAJVEER HANDLER 1945/11/09 [id number]    6th attempt to outreach the patient for assessment.  No answer.  HIPAA compliant voicemail left with contact information.  Plan:  [place] will outreach the patient with in the next two months.  [name] RN, BSN, [place] Direct Dial:  [phone]  Fax: [phone]

## 2019-03-08 ENCOUNTER — Other Ambulatory Visit: Payer: Self-pay

## 2019-03-08 NOTE — Patient Outreach (Signed)
[place] [facility]) Care Management  03/08/2019  [name] 22-Sep-1945 [id number]    RN Health Coach closing the program.  Patient is transitioning to external program [facility] for continued case management.  [name] RN, BSN, [place] Direct Dial:  [phone]  Fax: [phone]

## 2019-03-14 ENCOUNTER — Other Ambulatory Visit: Payer: Self-pay

## 2019-03-14 ENCOUNTER — Ambulatory Visit (INDEPENDENT_AMBULATORY_CARE_PROVIDER_SITE_OTHER): Payer: PPO | Admitting: Podiatry

## 2019-03-14 ENCOUNTER — Encounter: Payer: Self-pay | Admitting: Podiatry

## 2019-03-14 DIAGNOSIS — B351 Tinea unguium: Secondary | ICD-10-CM | POA: Diagnosis not present

## 2019-03-14 DIAGNOSIS — E08621 Diabetes mellitus due to underlying condition with foot ulcer: Secondary | ICD-10-CM

## 2019-03-14 DIAGNOSIS — L97415 Non-pressure chronic ulcer of right heel and midfoot with muscle involvement without evidence of necrosis: Secondary | ICD-10-CM | POA: Diagnosis not present

## 2019-03-14 DIAGNOSIS — E1142 Type 2 diabetes mellitus with diabetic polyneuropathy: Secondary | ICD-10-CM

## 2019-03-14 NOTE — Patient Instructions (Signed)
Diabetes Mellitus and Foot Care  Foot care is an important part of your health, especially when you have diabetes. Diabetes may cause you to have problems because of poor blood flow (circulation) to your feet and legs, which can cause your skin to:   Become thinner and drier.   Break more easily.   Heal more slowly.   Peel and crack.  You may also have nerve damage (neuropathy) in your legs and feet, causing decreased feeling in them. This means that you may not notice minor injuries to your feet that could lead to more serious problems. Noticing and addressing any potential problems early is the best way to prevent future foot problems.  How to care for your feet  Foot hygiene   Wash your feet daily with warm water and mild soap. Do not use hot water. Then, pat your feet and the areas between your toes until they are completely dry. Do not soak your feet as this can dry your skin.   Trim your toenails straight across. Do not dig under them or around the cuticle. File the edges of your nails with an emery board or nail file.   Apply a moisturizing lotion or petroleum jelly to the skin on your feet and to dry, brittle toenails. Use lotion that does not contain alcohol and is unscented. Do not apply lotion between your toes.  Shoes and socks   Wear clean socks or stockings every day. Make sure they are not too tight. Do not wear knee-high stockings since they may decrease blood flow to your legs.   Wear shoes that fit properly and have enough cushioning. Always look in your shoes before you put them on to be sure there are no objects inside.   To break in new shoes, wear them for just a few hours a day. This prevents injuries on your feet.  Wounds, scrapes, corns, and calluses   Check your feet daily for blisters, cuts, bruises, sores, and redness. If you cannot see the bottom of your feet, use a mirror or ask someone for help.   Do not cut corns or calluses or try to remove them with medicine.   If you  find a minor scrape, cut, or break in the skin on your feet, keep it and the skin around it clean and dry. You may clean these areas with mild soap and water. Do not clean the area with peroxide, alcohol, or iodine.   If you have a wound, scrape, corn, or callus on your foot, look at it several times a day to make sure it is healing and not infected. Check for:  ? Redness, swelling, or pain.  ? Fluid or blood.  ? Warmth.  ? Pus or a bad smell.  General instructions   Do not cross your legs. This may decrease blood flow to your feet.   Do not use heating pads or hot water bottles on your feet. They may burn your skin. If you have lost feeling in your feet or legs, you may not know this is happening until it is too late.   Protect your feet from hot and cold by wearing shoes, such as at the beach or on hot pavement.   Schedule a complete foot exam at least once a year (annually) or more often if you have foot problems. If you have foot problems, report any cuts, sores, or bruises to your health care provider immediately.  Contact a health care provider if:     You have a medical condition that increases your risk of infection and you have any cuts, sores, or bruises on your feet.   You have an injury that is not healing.   You have redness on your legs or feet.   You feel burning or tingling in your legs or feet.   You have pain or cramps in your legs and feet.   Your legs or feet are numb.   Your feet always feel cold.   You have pain around a toenail.  Get help right away if:   You have a wound, scrape, corn, or callus on your foot and:  ? You have pain, swelling, or redness that gets worse.  ? You have fluid or blood coming from the wound, scrape, corn, or callus.  ? Your wound, scrape, corn, or callus feels warm to the touch.  ? You have pus or a bad smell coming from the wound, scrape, corn, or callus.  ? You have a fever.  ? You have a red line going up your leg.  Summary   Check your feet every day  for cuts, sores, red spots, swelling, and blisters.   Moisturize feet and legs daily.   Wear shoes that fit properly and have enough cushioning.   If you have foot problems, report any cuts, sores, or bruises to your health care provider immediately.   Schedule a complete foot exam at least once a year (annually) or more often if you have foot problems.  This information is not intended to replace advice given to you by your health care provider. Make sure you discuss any questions you have with your health care provider.  Document Released: 09/03/2000 Document Revised: 10/19/2017 Document Reviewed: 10/08/2016  [employer]vier Interactive Patient Education  2019 [employer]

## 2019-03-22 ENCOUNTER — Other Ambulatory Visit: Payer: Self-pay

## 2019-03-22 ENCOUNTER — Ambulatory Visit (INDEPENDENT_AMBULATORY_CARE_PROVIDER_SITE_OTHER): Payer: PPO | Admitting: Podiatry

## 2019-03-22 VITALS — Temp 97.8°F

## 2019-03-22 DIAGNOSIS — M2042 Other hammer toe(s) (acquired), left foot: Secondary | ICD-10-CM

## 2019-03-22 DIAGNOSIS — E08621 Diabetes mellitus due to underlying condition with foot ulcer: Secondary | ICD-10-CM | POA: Diagnosis not present

## 2019-03-22 DIAGNOSIS — L97415 Non-pressure chronic ulcer of right heel and midfoot with muscle involvement without evidence of necrosis: Secondary | ICD-10-CM

## 2019-03-24 NOTE — Progress Notes (Signed)
Subjective: Patient presents to clinic today for at-risk foot care. He has h/o amputation left hallux and ulceration plantar aspect of right foot.   He voices no new pedal concerns. He states he has stayed home due to COVID-19 pandemic.   Christopher Du, MD is his PCP and last visit was 2-3 months ago per patient recall.   Current Outpatient Medications:  .  acyclovir (ZOVIRAX) 400 MG tablet, , Disp: , Rfl:  .  aspirin EC 81 MG EC tablet, Take 1 tablet (81 mg total) by mouth daily. (Patient taking differently: Take 81 mg by mouth at bedtime. ), Disp: , Rfl:  .  b complex vitamins tablet, Take 1 tablet by mouth daily., Disp: , Rfl:  .  carvedilol (COREG) 3.125 MG tablet, TAKE ONE TABLET BY MOUTH TWICE A DAY WITH A MEAL, Disp: 180 tablet, Rfl: 3 .  Cholecalciferol (VITAMIN D3) 5000 units TABS, Take 5,000 Units by mouth daily., Disp: , Rfl:  .  clopidogrel (PLAVIX) 75 MG tablet, Take 75 mg by mouth daily. , Disp: , Rfl:  .  Continuous Blood Gluc Sensor (FREESTYLE LIBRE 14 DAY SENSOR) MISC, , Disp: , Rfl:  .  Cyanocobalamin ([employer] B-12) 1500 MCG TBDP, Take 1,500 mcg by mouth daily. , Disp: , Rfl:  .  DULoxetine (CYMBALTA) 60 MG capsule, Take 60 mg by mouth at bedtime. , Disp: , Rfl:  .  Empagliflozin-linaGLIPtin (GLYXAMBI) 10-5 MG TABS, Take 1 tablet by mouth at bedtime., Disp: , Rfl:  .  furosemide (LASIX) 40 MG tablet, Take 1 tablet (40 mg total) by mouth 2 (two) times daily. Schedule appointment for refills (Patient taking differently: Take 40 mg by mouth daily. Schedule appointment for refills), Disp: 180 tablet, Rfl: 3 .  glimepiride (AMARYL) 4 MG tablet, Take 4 mg by mouth daily with breakfast., Disp: , Rfl:  .  HYDROcodone-acetaminophen (NORCO/VICODIN) 5-325 MG tablet, Take 1 tablet by mouth every 6 (six) hours as needed for moderate pain., Disp: 20 tablet, Rfl: 0 .  levothyroxine (SYNTHROID, LEVOTHROID) 75 MCG tablet, Take 75 mcg by mouth daily before breakfast. , Disp: , Rfl:  .   methylPREDNISolone (MEDROL DOSEPAK) 4 MG TBPK tablet, , Disp: , Rfl:  .  nitroGLYCERIN (NITROSTAT) 0.4 MG SL tablet, Place 1 tablet (0.4 mg total) under the tongue every 5 (five) minutes as needed for chest pain., Disp: 25 tablet, Rfl: 3 .  potassium chloride SA (K-DUR,KLOR-CON) 20 MEQ tablet, Take 1 tablet (20 mEq total) by mouth daily. Please schedule appointment for refills (Patient taking differently: Take 20 mEq by mouth every other day. Please schedule appointment for refills), Disp: 90 tablet, Rfl: 3 .  pramipexole (MIRAPEX) 0.25 MG tablet, Take 2 tablets (0.5 mg total) by mouth 2 (two) times daily. Take 0.5mg  by mouth at 7 PM and 0.5mg  at 9 PM daily., Disp: 360 tablet, Rfl: 3 .  rosuvastatin (CRESTOR) 20 MG tablet, Take 20 mg by mouth daily., Disp: , Rfl:  .  tamsulosin (FLOMAX) 0.4 MG CAPS capsule, Take 0.4 mg by mouth daily. , Disp: , Rfl:  .  Wound Dressings (MEDIHONEY WOUND/BURN DRESSING) GEL, Apply to affected are 3 times a week, and cover with sterile dressing., Disp: 1 Tube, Rfl: 5   Allergies  Allergen Reactions  . Quinoline Yellow Ss [Yellow Dye #11]   . Codeine Nausea And Vomiting  . Tramadol Nausea Only    Objective: Vascular Examination: Capillary refill time immediate x 9 digits.  Dorsalis pedis pulses palpable b/l.  Posterior  tibial pulses faintly palpable b/l.  Digital hair absent x 9 digits.  Skin temperature gradient WNL b/l  Dermatological Examination: Skin thin, shiny and atrophic b/l  Toenails 1-5 right, 2-5 left discolored, thick, dystrophic with subungual debris and pain with palpation to nailbeds due to thickness of nails.  Hyperkeratotic lesion submet 1 right foot. There is subdermal hemorrhage. No erythema, no edema, no drainage. There is flocculence noted.  Predebridement measurements carried out today of 2.0 x 2.0 cm.   Postdebridement measurements today are: 1.0 x 1.5 cm with pink granular base.  No probing to bone, no undermining, no active pus.   No deep abscess, no odor was encountered.    Musculoskeletal: Muscle strength 5/5 to all LE muscle groups.  Status post hallux amputation left foot  Neurological: Sensation absent with 10 gram monofilament.  Assessment: 1. Painful onychomycosis toenails 1-5 right, 2-5 left  2. Diabetic Ulceration submet head 1 right foot 3. NIDDM with peripheral neuropathy  Plan: 1. Toenails 1-5 right, 2-5 left  were debrided in length and girth without iatrogenic bleeding. 2. Ulcer submet head 1 right foot was debrided and reactive hyperkeratoses and necrotic tissue was resected to the level of bleeding or viable tissue. Ulcer was cleansed with wound cleanser. Iodosorb Gel was applied to base of wound with light dressing. 3. Resume wearing surgical shoe right foot. 4. Patient was given instructions on offloading and dressing change/aftercare and was instructed to call immediately if any signs or symptoms of infection arise.  5. Patient instructed to report to emergency department with worsening appearance of ulcer/toe/foot, increased pain, foul odor, increased redness, swelling, drainage, fever, chills, nightsweats, nausea, vomiting, increased blood sugar.  6. Patient/POA related understanding. 7. Follow up one week with Dr. March [name] for ulcer management and 9 weeks with me for routine foot care. 8. Patient/POA to call should there be a concern in the interim.

## 2019-03-27 DIAGNOSIS — Z1211 Encounter for screening for malignant neoplasm of colon: Secondary | ICD-10-CM | POA: Diagnosis not present

## 2019-03-27 DIAGNOSIS — Z0000 Encounter for general adult medical examination without abnormal findings: Secondary | ICD-10-CM | POA: Diagnosis not present

## 2019-03-29 DIAGNOSIS — E1165 Type 2 diabetes mellitus with hyperglycemia: Secondary | ICD-10-CM | POA: Diagnosis not present

## 2019-03-29 DIAGNOSIS — I6350 Cerebral infarction due to unspecified occlusion or stenosis of unspecified cerebral artery: Secondary | ICD-10-CM | POA: Diagnosis not present

## 2019-03-29 DIAGNOSIS — I10 Essential (primary) hypertension: Secondary | ICD-10-CM | POA: Diagnosis not present

## 2019-03-29 DIAGNOSIS — I2510 Atherosclerotic heart disease of native coronary artery without angina pectoris: Secondary | ICD-10-CM | POA: Diagnosis not present

## 2019-04-06 DIAGNOSIS — Z1212 Encounter for screening for malignant neoplasm of rectum: Secondary | ICD-10-CM | POA: Diagnosis not present

## 2019-04-06 DIAGNOSIS — Z1211 Encounter for screening for malignant neoplasm of colon: Secondary | ICD-10-CM | POA: Diagnosis not present

## 2019-04-10 ENCOUNTER — Ambulatory Visit: Payer: Self-pay

## 2019-04-12 ENCOUNTER — Ambulatory Visit (INDEPENDENT_AMBULATORY_CARE_PROVIDER_SITE_OTHER): Payer: PPO | Admitting: Podiatry

## 2019-04-12 ENCOUNTER — Other Ambulatory Visit: Payer: Self-pay

## 2019-04-12 VITALS — Temp 98.1°F

## 2019-04-12 DIAGNOSIS — L97415 Non-pressure chronic ulcer of right heel and midfoot with muscle involvement without evidence of necrosis: Secondary | ICD-10-CM

## 2019-04-12 DIAGNOSIS — M6240 Contracture of muscle, unspecified site: Secondary | ICD-10-CM | POA: Diagnosis not present

## 2019-04-12 DIAGNOSIS — M2042 Other hammer toe(s) (acquired), left foot: Secondary | ICD-10-CM | POA: Diagnosis not present

## 2019-04-12 DIAGNOSIS — E08621 Diabetes mellitus due to underlying condition with foot ulcer: Secondary | ICD-10-CM | POA: Diagnosis not present

## 2019-04-12 NOTE — Progress Notes (Signed)
Subjective:  Patient ID: [name], male    DOB: [birth year],  MRN: [id number]  Chief Complaint  Patient presents with  . Wound Check    Right plantar ulcer check. Pt states no concerns, denies any drainage, and denies fever/nausea/vomiting/chills.   DOS: 10/01/2018 Procedure: 2nd/3rd Hammertoe Repair with Buried Pin Fixation  73 y.o. male returns for follow-up.  Thinks the wound is doing much better since the pressure was adjusted and is shoe.  Would like to get new diabetic shoes.  Review of Systems: Negative except as noted in the HPI. Denies N/V/F/Ch.  Past Medical History:  Diagnosis Date  . Arthritis   . Cardiomyopathy- EF NL 01/2013, now 30-35% echo 03/14/2012  . Carotid artery occlusion    left s/p CEA  . CHF (congestive heart failure) ([place])   . Diabetes mellitus   . GERD (gastroesophageal reflux disease)   . HOH (hard of hearing)   . Hypertension   . Hypothyroidism   . Obstructive sleep apnea   . Orthostatic hypotension   . Osteomyelitis ([place])    left great toe  . Peripheral arterial disease ([place]), s/p PTA x 2 RLE    nonhealing ulcers bilaterally on each great toe  . Pneumonia   . PONV (postoperative nausea and vomiting)   . Restless leg syndrome   . Shortness of breath   . Stroke [facility]) March 08, 2012  . Wears dentures     Current Outpatient Medications:  .  acyclovir (ZOVIRAX) 400 MG tablet, , Disp: , Rfl:  .  aspirin EC 81 MG EC tablet, Take 1 tablet (81 mg total) by mouth daily. (Patient taking differently: Take 81 mg by mouth at bedtime. ), Disp: , Rfl:  .  b complex vitamins tablet, Take 1 tablet by mouth daily., Disp: , Rfl:  .  carvedilol (COREG) 3.125 MG tablet, TAKE ONE TABLET BY MOUTH TWICE A DAY WITH A MEAL, Disp: 180  tablet, Rfl: 3 .  Cholecalciferol (VITAMIN D3) 5000 units TABS, Take 5,000 Units by mouth daily., Disp: , Rfl:  .  clopidogrel (PLAVIX) 75 MG tablet, Take 75 mg by mouth daily. , Disp: , Rfl:  .  Continuous Blood Gluc Sensor (FREESTYLE LIBRE 14 DAY SENSOR) MISC, , Disp: , Rfl:  .  Cyanocobalamin ([employer]S B-12) 1500 MCG TBDP, Take 1,500 mcg by mouth daily. , Disp: , Rfl:  .  DULoxetine (CYMBALTA) 60 MG capsule, Take 60 mg by mouth at bedtime. , Disp: , Rfl:  .  Empagliflozin-linaGLIPtin (GLYXAMBI) 10-5 MG TABS, Take 1 tablet by mouth at bedtime., Disp: , Rfl:  .  furosemide (LASIX) 40 MG tablet, Take 1 tablet (40 mg total) by mouth 2 (two) times daily. Schedule appointment for refills (Patient taking differently: Take 40 mg by mouth daily. Schedule appointment for refills), Disp: 180 tablet, Rfl: 3 .  glimepiride (AMARYL) 4 MG tablet, Take 4 mg by mouth daily with breakfast., Disp: , Rfl:  .  HYDROcodone-acetaminophen (NORCO/VICODIN) 5-325 MG tablet, Take 1 tablet by mouth every 6 (six) hours as needed for moderate pain., Disp: 20 tablet, Rfl: 0 .  levothyroxine (SYNTHROID, LEVOTHROID) 75 MCG tablet, Take 75 mcg by mouth daily before breakfast. , Disp: , Rfl:  .  methylPREDNISolone (MEDROL DOSEPAK) 4 MG TBPK tablet, , Disp: , Rfl:  .  nitroGLYCERIN (NITROSTAT) 0.4 MG SL tablet, Place 1 tablet (0.4 mg total) under the tongue every 5 (five) minutes as needed for chest pain., Disp: 25 tablet, Rfl: 3 .  potassium chloride SA (K-DUR,KLOR-CON) 20 MEQ tablet, Take 1 tablet (20 mEq total) by mouth daily. Please schedule appointment for refills (Patient taking differently: Take 20 mEq by mouth every other day. Please schedule appointment for refills), Disp: 90 tablet, Rfl: 3 .  pramipexole (MIRAPEX) 0.25 MG tablet, Take 2 tablets (0.5 mg total) by mouth 2 (two) times daily. Take 0.5mg  by mouth at 7 PM and 0.5mg  at 9 PM daily., Disp: 360 tablet, Rfl: 3 .  rosuvastatin (CRESTOR) 20 MG tablet, Take 20 mg by mouth  daily., Disp: , Rfl:  .  tamsulosin (FLOMAX) 0.4 MG CAPS capsule, Take 0.4 mg by mouth daily. , Disp: , Rfl:  .  Wound Dressings (MEDIHONEY WOUND/BURN DRESSING) GEL, Apply to affected are 3 times a week, and cover with sterile dressing., Disp: 1 Tube, Rfl: 5  Social History   Tobacco Use  Smoking Status Former Smoker  . Years: 1.00  . Types: Pipe  . Quit date: 07/08/1977  . Years since quitting: 41.7  Smokeless Tobacco Never Used    Allergies  Allergen Reactions  . Quinoline Yellow Ss [Yellow Dye #11]   . Codeine Nausea And Vomiting  . Tramadol Nausea Only   Objective:   Vitals:   04/12/19 1453  Temp: 98.1 F (36.7 C)   There is no height or weight on file to calculate BMI. Constitutional Well developed. Well nourished.  Vascular Foot warm and well perfused. Capillary refill normal to all digits.   Neurologic Normal speech. Oriented to person, place, and time. Epicritic sensation to light touch grossly present bilaterally.  Dermatologic  right first MPJ ulceration 1 x 0.5 with granular wound base no warmth erythema signs of acute infection  Orthopedic:  Toes rather rectus left foot only slight contracture no pain Hammertoe formation right feet   Radiographs: None today. Assessment:   1. Diabetic ulcer of right midfoot associated with diabetes mellitus due to underlying condition, with muscle involvement without evidence of necrosis ([place])   2. Hammer toe of left foot   3. Contracture of tendon sheath    Plan:  Patient was evaluated and treated and all questions answered.  Hx ulcer R foot -Appears to be improving since offloading of the ulcer -Debrided as below  Procedure: Selective Debridement of Wound Rationale: Removal of devitalized tissue from the wound to promote healing.  Pre-Debridement Wound Measurements: 0.5 cm x 1 cm x 0.2 cm  Post-Debridement Wound Measurements: same as pre-debridement. Type of Debridement: sharp selective Tissue Removed:  Devitalized soft-tissue Dressing: Dry, sterile, compression dressing. Disposition: Patient tolerated procedure well. Patient to return in 1 week for follow-up.   Would benefit from DM shoes given hx of ulcerations, hammertoes  No follow-ups on file.

## 2019-04-12 NOTE — Progress Notes (Signed)
Subjective:  Patient ID: [name], male    DOB: 11/06/1945,  MRN: [id number]  Chief Complaint  Patient presents with  . Diabetic Ulcer    follow up submet one right - doing about the same   DOS: 10/01/2018 Procedure: 2nd/3rd Hammertoe Repair with Buried Pin Fixation  73 y.o. male returns for follow-up.  States the left foot corrected hammertoes doing well without any pain.  Pleased with the result.  Still having issues with the ulcer on the right foot.  Has been applying dressing as advised.  Review of Systems: Negative except as noted in the HPI. Denies N/V/F/Ch.  Past Medical History:  Diagnosis Date  . Arthritis   . Cardiomyopathy- EF NL 01/2013, now 30-35% echo 03/14/2012  . Carotid artery occlusion    left s/p CEA  . CHF (congestive heart failure) ([place])   . Diabetes mellitus   . GERD (gastroesophageal reflux disease)   . HOH (hard of hearing)   . Hypertension   . Hypothyroidism   . Obstructive sleep apnea   . Orthostatic hypotension   . Osteomyelitis ([place])    left great toe  . Peripheral arterial disease ([place]), s/p PTA x 2 RLE    nonhealing ulcers bilaterally on each great toe  . Pneumonia   . PONV (postoperative nausea and vomiting)   . Restless leg syndrome   . Shortness of breath   . Stroke [facility]) March 08, 2012  . Wears dentures     Current Outpatient Medications:  .  acyclovir (ZOVIRAX) 400 MG tablet, , Disp: , Rfl:  .  aspirin EC 81 MG EC tablet, Take 1 tablet (81 mg total) by mouth daily. (Patient taking differently: Take 81 mg by mouth at bedtime. ), Disp: , Rfl:  .  b complex vitamins tablet, Take 1 tablet by mouth daily., Disp: , Rfl:  .  carvedilol (COREG) 3.125 MG tablet, TAKE ONE TABLET BY MOUTH TWICE A DAY WITH A  MEAL, Disp: 180 tablet, Rfl: 3 .  Cholecalciferol (VITAMIN D3) 5000 units TABS, Take 5,000 Units by mouth daily., Disp: , Rfl:  .  clopidogrel (PLAVIX) 75 MG tablet, Take 75 mg by mouth daily. , Disp: , Rfl:  .  Continuous Blood Gluc Sensor (FREESTYLE LIBRE 14 DAY SENSOR) MISC, , Disp: , Rfl:  .  Cyanocobalamin (CVS B-12) 1500 MCG TBDP, Take 1,500 mcg by mouth daily. , Disp: , Rfl:  .  DULoxetine (CYMBALTA) 60 MG capsule, Take 60 mg by mouth at bedtime. , Disp: , Rfl:  .  Empagliflozin-linaGLIPtin (GLYXAMBI) 10-5 MG TABS, Take 1 tablet by mouth at bedtime., Disp: , Rfl:  .  furosemide (LASIX) 40 MG tablet, Take 1 tablet (40 mg total) by mouth 2 (two) times daily. Schedule appointment for refills (Patient taking differently: Take 40 mg by mouth daily. Schedule appointment for refills), Disp: 180 tablet, Rfl: 3 .  glimepiride (AMARYL) 4 MG tablet, Take 4 mg by mouth daily with breakfast., Disp: , Rfl:  .  HYDROcodone-acetaminophen (NORCO/VICODIN) 5-325 MG tablet, Take 1 tablet by mouth every 6 (six) hours as needed for moderate pain., Disp: 20 tablet, Rfl: 0 .  levothyroxine (SYNTHROID, LEVOTHROID) 75 MCG tablet, Take 75 mcg by mouth daily before breakfast. , Disp: , Rfl:  .  methylPREDNISolone (MEDROL DOSEPAK) 4 MG TBPK tablet, , Disp: , Rfl:  .  nitroGLYCERIN (NITROSTAT) 0.4 MG SL tablet, Place 1 tablet (0.4 mg total) under the tongue every 5 (five) minutes as needed for chest  pain., Disp: 25 tablet, Rfl: 3 .  potassium chloride SA (K-DUR,KLOR-CON) 20 MEQ tablet, Take 1 tablet (20 mEq total) by mouth daily. Please schedule appointment for refills (Patient taking differently: Take 20 mEq by mouth every other day. Please schedule appointment for refills), Disp: 90 tablet, Rfl: 3 .  pramipexole (MIRAPEX) 0.25 MG tablet, Take 2 tablets (0.5 mg total) by mouth 2 (two) times daily. Take 0.'5mg'$  by mouth at 7 PM and 0.'5mg'$  at 9 PM daily., Disp: 360 tablet, Rfl: 3 .  rosuvastatin (CRESTOR) 20 MG tablet, Take 20  mg by mouth daily., Disp: , Rfl:  .  tamsulosin (FLOMAX) 0.4 MG CAPS capsule, Take 0.4 mg by mouth daily. , Disp: , Rfl:  .  Wound Dressings (MEDIHONEY WOUND/BURN DRESSING) GEL, Apply to affected are 3 times a week, and cover with sterile dressing., Disp: 1 Tube, Rfl: 5  Social History   Tobacco Use  Smoking Status Former Smoker  . Years: 1.00  . Types: Pipe  . Quit date: 07/08/1977  . Years since quitting: 41.7  Smokeless Tobacco Never Used    Allergies  Allergen Reactions  . Quinoline Yellow Ss [Yellow Dye #11]   . Codeine Nausea And Vomiting  . Tramadol Nausea Only   Objective:   Vitals:   03/22/19 1441  Temp: 97.8 F (36.6 C)   There is no height or weight on file to calculate BMI. Constitutional Well developed. Well nourished.  Vascular Foot warm and well perfused. Capillary refill normal to all digits.   Neurologic Normal speech. Oriented to person, place, and time. Epicritic sensation to light touch grossly present bilaterally.  Dermatologic  well-healed surgical incisions left foot no distal ulcerations noted Right first MPJ sub-met 1 ulceration measuring 1x1x0.2  Orthopedic:  Toes rather rectus left foot only slight contracture no pain   Radiographs: None today. Assessment:   1. Diabetic ulcer of right midfoot associated with diabetes mellitus due to underlying condition, with muscle involvement without evidence of necrosis ([place])   2. Hammer toe of left foot    Plan:  Patient was evaluated and treated and all questions answered.  Hammertoes left foot -Doing well no pain no ulceration  Hx ulcer R foot -Debrided as below -Shoe offloaded to decrease pressure. -Dressed with collagen gel and Telfa dressing.  Procedure: Selective Debridement of Wound Rationale: Removal of devitalized tissue from the wound to promote healing.  Pre-Debridement Wound Measurements: 1 cm x 1 cm x 0.2 cm  Post-Debridement Wound Measurements: same as pre-debridement. Type of  Debridement: sharp selective Tissue Removed: Devitalized soft-tissue Dressing: Dry, sterile, compression dressing. Disposition: Patient tolerated procedure well. Patient to return in 1 week for follow-up.    No follow-ups on file.

## 2019-04-20 ENCOUNTER — Encounter: Payer: Self-pay | Admitting: Internal Medicine

## 2019-04-20 ENCOUNTER — Ambulatory Visit: Payer: PPO | Admitting: Gastroenterology

## 2019-04-26 ENCOUNTER — Ambulatory Visit (INDEPENDENT_AMBULATORY_CARE_PROVIDER_SITE_OTHER): Payer: PPO | Admitting: Orthotics

## 2019-04-26 ENCOUNTER — Other Ambulatory Visit: Payer: Self-pay

## 2019-04-26 DIAGNOSIS — M2042 Other hammer toe(s) (acquired), left foot: Secondary | ICD-10-CM

## 2019-04-26 DIAGNOSIS — E08621 Diabetes mellitus due to underlying condition with foot ulcer: Secondary | ICD-10-CM

## 2019-04-26 DIAGNOSIS — L97415 Non-pressure chronic ulcer of right heel and midfoot with muscle involvement without evidence of necrosis: Secondary | ICD-10-CM

## 2019-04-26 DIAGNOSIS — E1142 Type 2 diabetes mellitus with diabetic polyneuropathy: Secondary | ICD-10-CM | POA: Diagnosis not present

## 2019-04-26 NOTE — Progress Notes (Signed)

## 2019-05-16 ENCOUNTER — Ambulatory Visit: Payer: PPO | Admitting: Podiatry

## 2019-05-18 ENCOUNTER — Ambulatory Visit: Payer: PPO | Admitting: Podiatry

## 2019-05-18 ENCOUNTER — Ambulatory Visit (INDEPENDENT_AMBULATORY_CARE_PROVIDER_SITE_OTHER): Payer: PPO | Admitting: Podiatry

## 2019-05-18 ENCOUNTER — Ambulatory Visit: Payer: PPO | Admitting: Orthotics

## 2019-05-18 ENCOUNTER — Other Ambulatory Visit: Payer: Self-pay

## 2019-05-18 DIAGNOSIS — E1142 Type 2 diabetes mellitus with diabetic polyneuropathy: Secondary | ICD-10-CM | POA: Diagnosis not present

## 2019-05-18 DIAGNOSIS — E08621 Diabetes mellitus due to underlying condition with foot ulcer: Secondary | ICD-10-CM

## 2019-05-18 DIAGNOSIS — B351 Tinea unguium: Secondary | ICD-10-CM

## 2019-05-18 DIAGNOSIS — E1169 Type 2 diabetes mellitus with other specified complication: Secondary | ICD-10-CM

## 2019-05-18 DIAGNOSIS — L97415 Non-pressure chronic ulcer of right heel and midfoot with muscle involvement without evidence of necrosis: Secondary | ICD-10-CM

## 2019-05-30 ENCOUNTER — Other Ambulatory Visit: Payer: Self-pay | Admitting: *Deleted

## 2019-05-30 ENCOUNTER — Other Ambulatory Visit: Payer: Self-pay

## 2019-05-30 ENCOUNTER — Ambulatory Visit (INDEPENDENT_AMBULATORY_CARE_PROVIDER_SITE_OTHER): Payer: PPO | Admitting: Gastroenterology

## 2019-05-30 ENCOUNTER — Encounter: Payer: Self-pay | Admitting: *Deleted

## 2019-05-30 ENCOUNTER — Encounter: Payer: Self-pay | Admitting: Gastroenterology

## 2019-05-30 DIAGNOSIS — R1319 Other dysphagia: Secondary | ICD-10-CM | POA: Insufficient documentation

## 2019-05-30 DIAGNOSIS — R1310 Dysphagia, unspecified: Secondary | ICD-10-CM | POA: Diagnosis not present

## 2019-05-30 DIAGNOSIS — R195 Other fecal abnormalities: Secondary | ICD-10-CM

## 2019-05-30 DIAGNOSIS — K5900 Constipation, unspecified: Secondary | ICD-10-CM | POA: Diagnosis not present

## 2019-05-30 MED ORDER — POLYETHYLENE GLYCOL 3350 17 G PO PACK: 17.0000 g | PACK | Freq: Every day | ORAL | 0 refills | Status: DC | PRN

## 2019-05-30 NOTE — Patient Instructions (Signed)
1. Start Miralax one capsule daily as needed to maintain soft regular bowel movements. This is very important especially the two weeks prior to colonoscopy. You can adjust dose as needed to maintain soft stool. Adjust downward or upward as needed but no more than one capful twice daily. Assess on daily basis, if good bowel movement may skip dose of Miralax. If no bowel movement in 24 hours, then take Miralax. Call with any questions or concerns. 2. Colonoscopy and upper endoscopy as scheduled. See separate instructions.

## 2019-05-30 NOTE — Progress Notes (Signed)
Primary Care Physician:  [name], MD  Primary Gastroenterologist:  Garfield Cornea, MD   Chief Complaint  Patient presents with  . +cologuard    never had tcs    HPI:  [name] is a 73 y.o. male here at the request of Dr. Luan Pulling for further evaluation of positive Cologuard.   Patient has never had a colonoscopy. States he has been avoiding. Recently agreed to [facility] and it was positive. He has chronic constipation. BM twice per week. Strains at times. Sometimes gets urge for BM and will pass solid stool before he gets to the bathroom. Has Miralax at home but does not take regularly. Thinks it may help if he takes regularly. No melena, brbpr. He has a lot of gas. He denies heartburn. No N/V. No abdominal pain. He has dysphagia to solid food. EGD in 2011 for for food impaction.  Noted to have Schatzki ring with superimposed component of stricture with erosive reflux esophagitis, status post disimpaction but dilation plan for another day.  Patient never returned.  Current Outpatient Medications  Medication Sig Dispense Refill  . aspirin EC 81 MG EC tablet Take 1 tablet (81 mg total) by mouth daily. (Patient taking differently: Take 81 mg by mouth at bedtime. )    . b complex vitamins tablet Take 1 tablet by mouth daily.    . carvedilol (COREG) 3.125 MG tablet TAKE ONE TABLET BY MOUTH TWICE A DAY WITH A MEAL 180 tablet 3  . Cholecalciferol (VITAMIN D3) 5000 units TABS Take 5,000 Units by mouth 2 (two) times daily.     . clopidogrel (PLAVIX) 75 MG tablet Take 75 mg by mouth daily.     . Continuous Blood Gluc Sensor (FREESTYLE LIBRE 14 DAY SENSOR) MISC     . Cyanocobalamin ([employer] B-12) 1500 MCG TBDP Take 1,500 mcg by mouth daily.     . DULoxetine (CYMBALTA) 60 MG capsule Take 60 mg by mouth at bedtime.     . Empagliflozin-linaGLIPtin (GLYXAMBI) 10-5 MG TABS Take 1 tablet by mouth at bedtime.    . furosemide (LASIX) 40 MG tablet Take 1 tablet (40 mg total) by mouth 2 (two) times  daily. Schedule appointment for refills (Patient taking differently: Take 40 mg by mouth 2 (two) times daily. Schedule appointment for refills) 180 tablet 3  . glimepiride (AMARYL) 4 MG tablet Take 4 mg by mouth daily with breakfast.    . levothyroxine (SYNTHROID, LEVOTHROID) 75 MCG tablet Take 75 mcg by mouth daily before breakfast.     . nitroGLYCERIN (NITROSTAT) 0.4 MG SL tablet Place 1 tablet (0.4 mg total) under the tongue every 5 (five) minutes as needed for chest pain. 25 tablet 3  . potassium chloride SA (K-DUR,KLOR-CON) 20 MEQ tablet Take 1 tablet (20 mEq total) by mouth daily. Please schedule appointment for refills (Patient taking differently: Take 20 mEq by mouth every other day. Please schedule appointment for refills) 90 tablet 3  . pramipexole (MIRAPEX) 0.25 MG tablet Take 2 tablets (0.5 mg total) by mouth 2 (two) times daily. Take 0.5mg  by mouth at 7 PM and 0.5mg  at 9 PM daily. 360 tablet 3  . rosuvastatin (CRESTOR) 20 MG tablet Take 20 mg by mouth daily.    . tamsulosin (FLOMAX) 0.4 MG CAPS capsule Take 0.4 mg by mouth daily.     . Wound Dressings (MEDIHONEY WOUND/BURN DRESSING) GEL Apply to affected are 3 times a week, and cover with sterile dressing. 1 Tube 5  . polyethylene glycol (  MIRALAX) 17 g packet Take 17 g by mouth daily as needed. 14 each 0   No current facility-administered medications for this visit.     Allergies as of 05/30/2019 - Review Complete 05/30/2019  Allergen Reaction Noted  . Quinoline yellow ss [yellow dye #11]  10/09/2018  . Codeine Nausea And Vomiting   . Tramadol Nausea Only 10/23/2012    Past Medical History:  Diagnosis Date  . Arthritis   . Cardiomyopathy- EF NL 01/2013, now 30-35% echo 03/14/2012  . Carotid artery occlusion    left s/p CEA  . CHF (congestive heart failure) ([place])   . Diabetes mellitus   . GERD (gastroesophageal reflux disease)   . HOH (hard of hearing)   . Hypertension   . Hypothyroidism   . Obstructive sleep apnea   .  Orthostatic hypotension   . Osteomyelitis ([place])    left great toe  . Peripheral arterial disease ([place]), s/p PTA x 2 RLE    nonhealing ulcers bilaterally on each great toe  . Pneumonia   . PONV (postoperative nausea and vomiting)   . Restless leg syndrome   . Shortness of breath   . Stroke [facility]) March 08, 2012  . Wears dentures     Past Surgical History:  Procedure Laterality Date  . AMPUTATION TOE Left 07/25/2018   Procedure: AMPUTATION TOE INTERPHALANGEAL HALLUX LEFT;  Surgeon: Evelina Bucy, DPM;  Location: [place];  Service: Podiatry;  Laterality: Left;  . ANGIOPLASTY  02/28/14   diamond back orbital rotational atherectomy of Rt. tibial  . BACK SURGERY    . BONE BIOPSY Left 07/25/2018   Procedure: SUPERFICIAL BONE BIOPSY;  Surgeon: Evelina Bucy, DPM;  Location: [employer];  Service: Podiatry;  Laterality: Left;  . CARDIAC CATHETERIZATION N/A 05/19/2016   Procedure: Right/Left Heart Cath and Coronary Angiography;  Surgeon: Belva Crome, MD;  Location: [place] [facility];  Service: Cardiovascular;  Laterality: N/A;  . CERVICAL FUSION    . ENDARTERECTOMY Left 11/29/2013   Procedure: ENDARTERECTOMY CAROTID;  Surgeon: Serafina Mitchell, MD;  Location: [facility] OR;  Service: Vascular;  Laterality: Left;  . ESOPHAGOGASTRODUODENOSCOPY  02/2010   Dr. Gala Romney: patient presented with food impaction, schatzki ring with superimposed component of stricture with erosive reflux esophagitis, s/p disimpaction but dilation planned at later date   . EYE SURGERY    . FOOT SURGERY    . Lower ext duplex doppler  03/14/14   Rt ABI 1.2  . LOWER EXTREMITY ANGIOGRAM Bilateral 02/18/2014   Procedure: LOWER EXTREMITY ANGIOGRAM;  Surgeon: [name], MD;  Location: [facility] CATH LAB;  Service: Cardiovascular;  Laterality: Bilateral;  . LOWER EXTREMITY ANGIOGRAM N/A 10/31/2014   Procedure: LOWER EXTREMITY ANGIOGRAM;  Surgeon: [name], MD;  Location: [facility] CATH LAB;  Service: Cardiovascular;  Laterality: N/A;  . MULTIPLE  TOOTH EXTRACTIONS    . PV angiogram  02/18/2014   tibial vessel diseas bil.  [name] SPINE SURGERY    . tendon achillies lengthing and sesamoid      Family History  Problem Relation Age of Onset  . Heart disease Mother   . Hypertension Mother   . Heart attack Mother   . Hypertension Father   . Diabetes Father   . Diabetes Son   . Heart disease Son   . Hypertension Son   . Colon cancer Neg Hx     Social History   Socioeconomic History  . Marital status: Married    Spouse name: Not  on file  . Number of children: Not on file  . Years of education: college  . Highest education level: Not on file  Occupational History  . Occupation: Firefighter   Social Needs  . Financial resource strain: Not on file  . Food insecurity    Worry: Not on file    Inability: Not on file  . Transportation needs    Medical: Not on file    Non-medical: Not on file  Tobacco Use  . Smoking status: Former Smoker    Years: 1.00    Types: Pipe    Quit date: 07/08/1977    Years since quitting: 41.9  . Smokeless tobacco: Never Used  Substance and Sexual Activity  . Alcohol use: No    Alcohol/week: 0.0 standard drinks  . Drug use: No  . Sexual activity: Not on file  Lifestyle  . Physical activity    Days per week: Not on file    Minutes per session: Not on file  . Stress: Not on file  Relationships  . Social Herbalist on phone: Not on file    Gets together: Not on file    Attends religious service: Not on file    Active member of club or organization: Not on file    Attends meetings of clubs or organizations: Not on file    Relationship status: Not on file  . Intimate partner violence    Fear of current or ex partner: Not on file    Emotionally abused: Not on file    Physically abused: Not on file    Forced sexual activity: Not on file  Other Topics Concern  . Not on file  Social History Narrative   Drinks 3 44oz cokes a day       ROS:  General: Negative for anorexia,  weight loss, fever, chills, fatigue, weakness. Eyes: Negative for vision changes.  ENT: Negative for hoarseness, nasal congestion.  See HPI CV: Negative for chest pain, angina, palpitations, dyspnea on exertion, peripheral edema.  Respiratory: Negative for dyspnea at rest, dyspnea on exertion, cough, sputum, wheezing.  GI: See history of present illness. GU:  Negative for dysuria, hematuria, urinary incontinence, urinary frequency, nocturnal urination.  MS: Positive for joint pain, low back pain.  Derm: Negative for rash or itching.  Neuro: Negative for weakness, abnormal sensation, seizure, frequent headaches, memory loss, confusion.  Psych: Negative for anxiety, depression, suicidal ideation, hallucinations.  Endo: Negative for unusual weight change.  Heme: Negative for bruising or bleeding. Allergy: Negative for rash or hives.    Physical Examination:  BP 117/63   Pulse 85   Temp (!) 96.8 F (36 C) (Temporal)   Ht 5\' 9"  (1.753 m)   Wt 233 lb 6.4 oz (105.9 kg)   BMI 34.47 kg/m    General: Well-nourished, well-developed in no acute distress.  Head: Normocephalic, atraumatic.   Eyes: Conjunctiva pink, no icterus. Mouth: Oropharyngeal mucosa moist and pink , no lesions erythema or exudate. Neck: Supple without thyromegaly, masses, or lymphadenopathy.  Lungs: Clear to auscultation bilaterally.  Heart: Regular rate and rhythm, no murmurs rubs or gallops.  Abdomen: Bowel sounds are normal, nontender, nondistended, no hepatosplenomegaly or masses, no abdominal bruits or    hernia , no rebound or guarding.   Rectal: Deferred Extremities: No lower extremity edema. No clubbing or deformities.  Neuro: Alert and oriented x 4 , grossly normal neurologically.  Skin: Warm and dry, no rash or jaundice.   Psych:  Alert and cooperative, normal mood and affect.  Labs: Labs from July 2020: BUN 21, creatinine 0.96, albumin 4.3, total bilirubin 0.8, alkaline phosphatase 79, AST 17, ALT 17, A1c  8.5, TSH 3.31, white blood cell count 4500, hemoglobin 11.7, hematocrit 33.5, MCV 97.7, platelets 205,000.  Imaging Studies: No results found.

## 2019-05-30 NOTE — Assessment & Plan Note (Addendum)
Pleasant 73 year old gentleman with history of chronic constipation, recent Cologuard test positive.  No prior colonoscopy.  Recommend MiraLAX 17 g daily to maintain soft regular bowel movements.  He will let me know if is not effective.  Colonoscopy in the near future to evaluate positive Cologuard.  Plan for deep sedation given polypharmacy.  I have discussed the risks, alternatives, benefits with regards to but not limited to the risk of reaction to medication, bleeding, infection, perforation and the patient is agreeable to proceed. Written consent to be obtained.  Patient wanted to make sure all are aware that he has significant restless leg syndrome which has been noted to be active while sedated for prior foot surgery.

## 2019-06-01 NOTE — Assessment & Plan Note (Signed)
Solid food esophageal dysphagia with previous history of Schatzki ring with stricture requiring food disimpaction in 2011.  Esophagus was never dilated.  He has been having ongoing dysphagia.  Plan for upper endoscopy with esophageal dilation at time of colonoscopy.  Plan for deep sedation given polypharmacy.  I have discussed the risks, alternatives, benefits with regards to but not limited to the risk of reaction to medication, bleeding, infection, perforation and the patient is agreeable to proceed. Written consent to be obtained.

## 2019-06-15 ENCOUNTER — Other Ambulatory Visit: Payer: Self-pay

## 2019-06-15 ENCOUNTER — Ambulatory Visit (INDEPENDENT_AMBULATORY_CARE_PROVIDER_SITE_OTHER): Payer: PPO | Admitting: Podiatry

## 2019-06-15 DIAGNOSIS — E08621 Diabetes mellitus due to underlying condition with foot ulcer: Secondary | ICD-10-CM

## 2019-06-15 DIAGNOSIS — L97415 Non-pressure chronic ulcer of right heel and midfoot with muscle involvement without evidence of necrosis: Secondary | ICD-10-CM | POA: Diagnosis not present

## 2019-06-17 NOTE — Progress Notes (Signed)
Subjective:  Patient ID: [name], male    DOB: [birth year],  MRN: [id number]  Chief Complaint  Patient presents with  . Wound Check    pt is here for a wound check of his right foot, pt states that his right foot is healing alot better since the last time he was here, pt states that he shows no signs of infection   DOS: 10/01/2018 Procedure: 2nd/3rd Hammertoe Repair with Buried Pin Fixation  73 y.o. male returns for follow-up.  Thinks the wound is doing much better since the pressure was adjusted and is shoe.  Would like to get new diabetic shoes.  Review of Systems: Negative except as noted in the HPI. Denies N/V/F/Ch.  Past Medical History:  Diagnosis Date  . Arthritis   . Cardiomyopathy- EF NL 01/2013, now 30-35% echo 03/14/2012  . Carotid artery occlusion    left s/p CEA  . CHF (congestive heart failure) ([place])   . Diabetes mellitus   . GERD (gastroesophageal reflux disease)   . HOH (hard of hearing)   . Hypertension   . Hypothyroidism   . Obstructive sleep apnea   . Orthostatic hypotension   . Osteomyelitis ([place])    left great toe  . Peripheral arterial disease ([place]), s/p PTA x 2 RLE    nonhealing ulcers bilaterally on each great toe  . Pneumonia   . PONV (postoperative nausea and vomiting)   . Restless leg syndrome   . Shortness of breath   . Stroke [facility]) March 08, 2012  . Wears dentures     Current Outpatient Medications:  .  aspirin EC 81 MG EC tablet, Take 1 tablet (81 mg total) by mouth daily. (Patient taking differently: Take 81 mg by mouth at bedtime. ), Disp: , Rfl:  .  b complex vitamins tablet, Take 1 tablet by mouth daily., Disp: , Rfl:  .  carvedilol (COREG) 3.125 MG tablet, TAKE ONE TABLET BY MOUTH TWICE A DAY WITH A  MEAL, Disp: 180 tablet, Rfl: 3 .  Cholecalciferol (VITAMIN D3) 5000 units TABS, Take 5,000 Units by mouth 2 (two) times daily. , Disp: , Rfl:  .  clopidogrel (PLAVIX) 75 MG tablet, Take 75 mg by mouth daily. , Disp: , Rfl:  .  Continuous Blood Gluc Sensor (FREESTYLE LIBRE 14 DAY SENSOR) MISC, , Disp: , Rfl:  .  Cyanocobalamin ([employer]S B-12) 1500 MCG TBDP, Take 1,500 mcg by mouth daily. , Disp: , Rfl:  .  DULoxetine (CYMBALTA) 60 MG capsule, Take 60 mg by mouth at bedtime. , Disp: , Rfl:  .  Empagliflozin-linaGLIPtin (GLYXAMBI) 10-5 MG TABS, Take 1 tablet by mouth at bedtime., Disp: , Rfl:  .  furosemide (LASIX) 40 MG tablet, Take 1 tablet (40 mg total) by mouth 2 (two) times daily. Schedule appointment for refills (Patient taking differently: Take 40 mg by mouth 2 (two) times daily. Schedule appointment for refills), Disp: 180 tablet, Rfl: 3 .  glimepiride (AMARYL) 4 MG tablet, Take 4 mg by mouth daily with breakfast., Disp: , Rfl:  .  levothyroxine (SYNTHROID, LEVOTHROID) 75 MCG tablet, Take 75 mcg by mouth daily before breakfast. , Disp: , Rfl:  .  nitroGLYCERIN (NITROSTAT) 0.4 MG SL tablet, Place 1 tablet (0.4 mg total) under the tongue every 5 (five) minutes as needed for chest pain., Disp: 25 tablet, Rfl: 3 .  potassium chloride SA (K-DUR,KLOR-CON) 20 MEQ tablet, Take 1 tablet (20 mEq total) by mouth daily. Please schedule appointment for refills (  Patient taking differently: Take 20 mEq by mouth every other day. Please schedule appointment for refills), Disp: 90 tablet, Rfl: 3 .  pramipexole (MIRAPEX) 0.25 MG tablet, Take 2 tablets (0.5 mg total) by mouth 2 (two) times daily. Take 0.5mg  by mouth at 7 PM and 0.5mg  at 9 PM daily., Disp: 360 tablet, Rfl: 3 .  rosuvastatin (CRESTOR) 20 MG tablet, Take 20 mg by mouth daily., Disp: , Rfl:  .  tamsulosin (FLOMAX) 0.4 MG CAPS capsule, Take 0.4 mg by mouth daily. , Disp: , Rfl:  .  Wound Dressings (MEDIHONEY WOUND/BURN DRESSING) GEL, Apply to affected are 3  times a week, and cover with sterile dressing., Disp: 1 Tube, Rfl: 5 .  polyethylene glycol (MIRALAX) 17 g packet, Take 17 g by mouth daily as needed., Disp: 14 each, Rfl: 0  Social History   Tobacco Use  Smoking Status Former Smoker  . Years: 1.00  . Types: Pipe  . Quit date: 07/08/1977  . Years since quitting: 41.9  Smokeless Tobacco Never Used    Allergies  Allergen Reactions  . Quinoline Yellow Ss [Yellow Dye #11]   . Codeine Nausea And Vomiting  . Tramadol Nausea Only   Objective:   There were no vitals filed for this visit. There is no height or weight on file to calculate BMI. Constitutional Well developed. Well nourished.  Vascular Foot warm and well perfused. Capillary refill normal to all digits.   Neurologic Normal speech. Oriented to person, place, and time. Epicritic sensation to light touch grossly present bilaterally.  Dermatologic  right first MPJ ulceration 1.5x1 post-debridement. Nails elongated thickened dystrophic  Orthopedic:  Toes rather rectus left foot only slight contracture no pain Hammertoe formation right feet   Radiographs: None today. Assessment:   1. Diabetic ulcer of right midfoot associated with diabetes mellitus due to underlying condition, with muscle involvement without evidence of necrosis ([place])   2. Onychomycosis of multiple toenails with type 2 diabetes mellitus and peripheral neuropathy ([place])    Plan:  Patient was evaluated and treated and all questions answered.  Hx ulcer R foot -Debridement as below -Dressed with Silvadene and dry sterile dressing  Procedure: Excisional Debridement of Wound Rationale: Removal of non-viable soft tissue from the wound to promote healing.  Anesthesia: none Pre-Debridement Wound Measurements: 1 cm x 1 cm x 0.2 cm  Post-Debridement Wound Measurements: 1.5 cm x 1 cm x 0.2 cm  Type of Debridement: Sharp Excisional Tissue Removed: Non-viable soft tissue Depth of Debridement: subcutaneous  tissue. Technique: Sharp excisional debridement to bleeding, viable wound base.  Dressing: Dry, sterile, compression dressing. Disposition: Patient tolerated procedure well. Patient to return in 1 week for follow-up.     Onychomycosis with diabetic peripheral neuropathy -Nails debrided x10       No follow-ups on file.

## 2019-06-20 NOTE — Progress Notes (Signed)
Subjective:  Patient ID: [name], male    DOB: [birth year],  MRN: [id number]  Chief Complaint  Patient presents with  . Wound Check    Pt states right plantar ulcer is improving. Denies any drainage. Denies fever/nausea/vomiting/chills.   73 y.o. male returns for follow-up.  States the wound is doing maybe a little better.  Review of Systems: Negative except as noted in the HPI. Denies N/V/F/Ch.  Past Medical History:  Diagnosis Date  . Arthritis   . Cardiomyopathy- EF NL 01/2013, now 30-35% echo 03/14/2012  . Carotid artery occlusion    left s/p CEA  . CHF (congestive heart failure) ([place])   . Diabetes mellitus   . GERD (gastroesophageal reflux disease)   . HOH (hard of hearing)   . Hypertension   . Hypothyroidism   . Obstructive sleep apnea   . Orthostatic hypotension   . Osteomyelitis ([place])    left great toe  . Peripheral arterial disease ([place]), s/p PTA x 2 RLE    nonhealing ulcers bilaterally on each great toe  . Pneumonia   . PONV (postoperative nausea and vomiting)   . Restless leg syndrome   . Shortness of breath   . Stroke [facility]) March 08, 2012  . Wears dentures     Current Outpatient Medications:  .  aspirin EC 81 MG EC tablet, Take 1 tablet (81 mg total) by mouth daily. (Patient taking differently: Take 81 mg by mouth at bedtime. ), Disp: , Rfl:  .  b complex vitamins tablet, Take 1 tablet by mouth daily., Disp: , Rfl:  .  carvedilol (COREG) 3.125 MG tablet, TAKE ONE TABLET BY MOUTH TWICE A DAY WITH A MEAL, Disp: 180 tablet, Rfl: 3 .  Cholecalciferol (VITAMIN D3) 5000 units TABS, Take 5,000 Units by mouth 2 (two) times daily. , Disp: , Rfl:  .  clopidogrel (PLAVIX) 75 MG tablet, Take 75 mg by mouth daily. , Disp: , Rfl:  .   Continuous Blood Gluc Sensor (FREESTYLE LIBRE 14 DAY SENSOR) MISC, , Disp: , Rfl:  .  Cyanocobalamin ([employer]S B-12) 1500 MCG TBDP, Take 1,500 mcg by mouth daily. , Disp: , Rfl:  .  DULoxetine (CYMBALTA) 60 MG capsule, Take 60 mg by mouth at bedtime. , Disp: , Rfl:  .  Empagliflozin-linaGLIPtin (GLYXAMBI) 10-5 MG TABS, Take 1 tablet by mouth at bedtime., Disp: , Rfl:  .  furosemide (LASIX) 40 MG tablet, Take 1 tablet (40 mg total) by mouth 2 (two) times daily. Schedule appointment for refills (Patient taking differently: Take 40 mg by mouth 2 (two) times daily. Schedule appointment for refills), Disp: 180 tablet, Rfl: 3 .  glimepiride (AMARYL) 4 MG tablet, Take 4 mg by mouth daily with breakfast., Disp: , Rfl:  .  levothyroxine (SYNTHROID, LEVOTHROID) 75 MCG tablet, Take 75 mcg by mouth daily before breakfast. , Disp: , Rfl:  .  nitroGLYCERIN (NITROSTAT) 0.4 MG SL tablet, Place 1 tablet (0.4 mg total) under the tongue every 5 (five) minutes as needed for chest pain., Disp: 25 tablet, Rfl: 3 .  polyethylene glycol (MIRALAX) 17 g packet, Take 17 g by mouth daily as needed., Disp: 14 each, Rfl: 0 .  potassium chloride SA (K-DUR,KLOR-CON) 20 MEQ tablet, Take 1 tablet (20 mEq total) by mouth daily. Please schedule appointment for refills (Patient taking differently: Take 20 mEq by mouth every other day. Please schedule appointment for refills), Disp: 90 tablet, Rfl: 3 .  pramipexole (MIRAPEX) 0.25 MG tablet, Take  2 tablets (0.5 mg total) by mouth 2 (two) times daily. Take 0.5mg  by mouth at 7 PM and 0.5mg  at 9 PM daily., Disp: 360 tablet, Rfl: 3 .  rosuvastatin (CRESTOR) 20 MG tablet, Take 20 mg by mouth daily., Disp: , Rfl:  .  tamsulosin (FLOMAX) 0.4 MG CAPS capsule, Take 0.4 mg by mouth daily. , Disp: , Rfl:  .  Wound Dressings (MEDIHONEY WOUND/BURN DRESSING) GEL, Apply to affected are 3 times a week, and cover with sterile dressing., Disp: 1 Tube, Rfl: 5  Social History   Tobacco Use  Smoking Status  Former Smoker  . Years: 1.00  . Types: Pipe  . Quit date: 07/08/1977  . Years since quitting: 41.9  Smokeless Tobacco Never Used    Allergies  Allergen Reactions  . Quinoline Yellow Ss [Yellow Dye #11]   . Codeine Nausea And Vomiting  . Tramadol Nausea Only   Objective:   There were no vitals filed for this visit. There is no height or weight on file to calculate BMI. Constitutional Well developed. Well nourished.  Vascular Foot warm and well perfused. Capillary refill normal to all digits.   Neurologic Normal speech. Oriented to person, place, and time. Epicritic sensation to light touch grossly present bilaterally.  Dermatologic Right 1st MPJ ulcer 1x1 post-debridement no warmth erythema signs of infection. Hyperkeratosis noted about the periwound  Orthopedic:  Toes rather rectus left foot only slight contracture no pain Hammertoe formation right feet   Radiographs: None today. Assessment:   1. Diabetic ulcer of right midfoot associated with diabetes mellitus due to underlying condition, with muscle involvement without evidence of necrosis [facility])    Plan:  Patient was evaluated and treated and all questions answered.  Hx ulcer R foot -Debridement as below -Continue offloading inserts -Likely will not fully heal without surgical intervention however patient not interested at this time.  Procedure: Excisional Debridement of Wound Rationale: Removal of non-viable soft tissue from the wound to promote healing.  Anesthesia: none Pre-Debridement Wound Measurements: 0.5 cm x 0.5 cm x 0.1 cm  Post-Debridement Wound Measurements: 1 cm x 1 cm x 0.1 cm  Type of Debridement: Sharp Excisional Tissue Removed: Non-viable soft tissue Depth of Debridement: subcutaneous tissue. Technique: Sharp excisional debridement to bleeding, viable wound base.  Dressing: Dry, sterile, compression dressing. Disposition: Patient tolerated procedure well. Patient to return in 1 week for  follow-up.     No follow-ups on file.

## 2019-07-10 ENCOUNTER — Other Ambulatory Visit: Payer: Self-pay | Admitting: Cardiovascular Disease

## 2019-07-12 ENCOUNTER — Encounter (HOSPITAL_COMMUNITY): Payer: PPO

## 2019-07-20 ENCOUNTER — Ambulatory Visit (INDEPENDENT_AMBULATORY_CARE_PROVIDER_SITE_OTHER): Payer: PPO | Admitting: Podiatry

## 2019-07-20 ENCOUNTER — Other Ambulatory Visit: Payer: Self-pay

## 2019-07-20 DIAGNOSIS — L97415 Non-pressure chronic ulcer of right heel and midfoot with muscle involvement without evidence of necrosis: Secondary | ICD-10-CM

## 2019-07-20 DIAGNOSIS — E08621 Diabetes mellitus due to underlying condition with foot ulcer: Secondary | ICD-10-CM | POA: Diagnosis not present

## 2019-07-20 NOTE — Progress Notes (Signed)
Subjective:  Patient ID: [name], male    DOB: 06/17/1946,  MRN: [id number]  Chief Complaint  Patient presents with   Diabetic Ulcer    Pt states right plantar midfoot ulcer is improving, denies fever/nausea/vomiting/chills/drainage. Pt has no new concerns.   73 y.o. male returns for follow-up.  Thinks the wound is doing better denies new concerns or issues  Review of Systems: Negative except as noted in the HPI. Denies N/V/F/Ch.  Past Medical History:  Diagnosis Date   Arthritis    Cardiomyopathy- EF NL 01/2013, now 30-35% echo 03/14/2012   Carotid artery occlusion    left s/p CEA   CHF (congestive heart failure) (HCC)    Diabetes mellitus    GERD (gastroesophageal reflux disease)    HOH (hard of hearing)    Hypertension    Hypothyroidism    Obstructive sleep apnea    Orthostatic hypotension    Osteomyelitis (HCC)    left great toe   Peripheral arterial disease ([place]), s/p PTA x 2 RLE    nonhealing ulcers bilaterally on each great toe   Pneumonia    PONV (postoperative nausea and vomiting)    Restless leg syndrome    Shortness of breath    Stroke [facility]) March 08, 2012   Wears dentures     Current Outpatient Medications:    aspirin EC 81 MG EC tablet, Take 1 tablet (81 mg total) by mouth daily. (Patient taking differently: Take 81 mg by mouth at bedtime. ), Disp: , Rfl:    b complex vitamins tablet, Take 1 tablet by mouth daily., Disp: , Rfl:    carvedilol (COREG) 3.125 MG tablet, TAKE ONE TABLET BY MOUTH TWICE A DAY WITH A MEAL, Disp: 180 tablet, Rfl: 3   Cholecalciferol (VITAMIN D3) 5000 units TABS, Take 5,000 Units by mouth 2 (two) times daily. , Disp: , Rfl:    clopidogrel (PLAVIX) 75 MG tablet, Take 75 mg by  mouth daily. , Disp: , Rfl:    Continuous Blood Gluc Sensor (FREESTYLE LIBRE 14 DAY SENSOR) MISC, , Disp: , Rfl:    Cyanocobalamin ([employer] B-12) 1500 MCG TBDP, Take 1,500 mcg by mouth daily. , Disp: , Rfl:    DULoxetine (CYMBALTA) 60 MG capsule, Take 60 mg by mouth at bedtime. , Disp: , Rfl:    Empagliflozin-linaGLIPtin (GLYXAMBI) 10-5 MG TABS, Take 1 tablet by mouth at bedtime., Disp: , Rfl:    furosemide (LASIX) 40 MG tablet, TAKE ONE TABLET (40MG  TOTAL) BY MOUTH TWO TIMES DAILY., Disp: 180 tablet, Rfl: 3   glimepiride (AMARYL) 4 MG tablet, Take 4 mg by mouth daily with breakfast., Disp: , Rfl:    levothyroxine (SYNTHROID, LEVOTHROID) 75 MCG tablet, Take 75 mcg by mouth daily before breakfast. , Disp: , Rfl:    nitroGLYCERIN (NITROSTAT) 0.4 MG SL tablet, Place 1 tablet (0.4 mg total) under the tongue every 5 (five) minutes as needed for chest pain., Disp: 25 tablet, Rfl: 3   polyethylene glycol (MIRALAX) 17 g packet, Take 17 g by mouth daily as needed., Disp: 14 each, Rfl: 0   potassium chloride SA (K-DUR,KLOR-CON) 20 MEQ tablet, Take 1 tablet (20 mEq total) by mouth daily. Please schedule appointment for refills (Patient taking differently: Take 20 mEq by mouth every other day. Please schedule appointment for refills), Disp: 90 tablet, Rfl: 3   pramipexole (MIRAPEX) 0.25 MG tablet, Take 2 tablets (0.5 mg total) by mouth 2 (two) times daily. Take 0.5mg  by mouth at 7  PM and 0.5mg  at 9 PM daily., Disp: 360 tablet, Rfl: 3   rosuvastatin (CRESTOR) 20 MG tablet, Take 20 mg by mouth daily., Disp: , Rfl:    tamsulosin (FLOMAX) 0.4 MG CAPS capsule, Take 0.4 mg by mouth daily. , Disp: , Rfl:    Wound Dressings (MEDIHONEY WOUND/BURN DRESSING) GEL, Apply to affected are 3 times a week, and cover with sterile dressing., Disp: 1 Tube, Rfl: 5  Social History   Tobacco Use  Smoking Status Former Smoker   Years: 1.00   Types: Pipe   Quit date: 07/08/1977   Years since quitting: 42.0    Smokeless Tobacco Never Used    Allergies  Allergen Reactions   Quinoline Yellow Ss [Yellow Dye #11]    Codeine Nausea And Vomiting   Tramadol Nausea Only   Objective:   There were no vitals filed for this visit. There is no height or weight on file to calculate BMI. Constitutional Well developed. Well nourished.  Vascular Foot warm and well perfused. Capillary refill normal to all digits.   Neurologic Normal speech. Oriented to person, place, and time. Epicritic sensation to light touch grossly present bilaterally.  Dermatologic Right 1st MPJ ulcer 1x1 post-debridement no warmth erythema signs of infection. Hyperkeratosis noted about the periwound  Orthopedic:  Toes rather rectus left foot only slight contracture no pain Hammertoe formation right feet   Radiographs: None today. Assessment:   1. Diabetic ulcer of right midfoot associated with diabetes mellitus due to underlying condition, with muscle involvement without evidence of necrosis [facility])    Plan:  Patient was evaluated and treated and all questions answered.  Hx ulcer R foot -Debridement as below.  Glass was removed from the lesion discussed the importance of compliance with wearing shoe gear and dressing of the wound -Diabetic insert was offloaded with assistance of orthotist  Procedure: Excisional Debridement of Wound Rationale: Removal of non-viable soft tissue from the wound to promote healing.  Anesthesia: none Pre-Debridement Wound Measurements: Overlying hyperkeratosis  Post-Debridement Wound Measurements: 1.5 cm x 1 cm x 0.2 cm  Type of Debridement: Sharp Excisional Tissue Removed: Non-viable soft tissue Depth of Debridement: subcutaneous tissue. Technique: Sharp excisional debridement to bleeding, viable wound base.  Dressing: Dry, sterile, compression dressing. Disposition: Patient tolerated procedure well. Patient to return in 1 week for follow-up.        No follow-ups on file.

## 2019-08-06 NOTE — Patient Instructions (Signed)
[name]  08/06/2019     @PREFPERIOPPHARMACY @   Your procedure is scheduled on  08/13/2019.  Report to [facility] at  [email or web address].  Call this number if you have problems the morning of surgery:  [phone]   Remember:  Follow the diet and prep instructions given to you by Dr Roseanne Kaufman office.                       Take these medicines the morning of surgery with A SIP OF WATER  Carvedilol, levothyroxine.    Do not wear jewelry, make-up or nail polish.  Do not wear lotions, powders, or perfumes. Please wear deodorant and brush your teeth.  Do not shave 48 hours prior to surgery.  Men may shave face and neck.  Do not bring valuables to the hospital.  [facility] is not responsible for any belongings or valuables.  Contacts, dentures or bridgework may not be worn into surgery.  Leave your suitcase in the car.  After surgery it may be brought to your room.  For patients admitted to the hospital, discharge time will be determined by your treatment team.  Patients discharged the day of surgery will not be allowed to drive home.   Name and phone number of your driver:   Stacy Sorby, wife Special instructions:  None  Please read over the following fact sheets that you were given. Anesthesia Post-op Instructions and Care and Recovery After Surgery       Upper Endoscopy, Adult, Care After This sheet gives you information about how to care for yourself after your procedure. Your health care provider may also give you more specific instructions. If you have problems or questions, contact your health care provider. What can I expect after the procedure? After the procedure, it is common to have:  A sore throat.  Mild stomach pain or discomfort.  Bloating.  Nausea. Follow these instructions at home:   Follow instructions from your health care provider about what to eat or drink after your procedure.  Return to your normal activities as told by your  health care provider. Ask your health care provider what activities are safe for you.  Take over-the-counter and prescription medicines only as told by your health care provider.  Do not drive for 24 hours if you were given a sedative during your procedure.  Keep all follow-up visits as told by your health care provider. This is important. Contact a health care provider if you have:  A sore throat that lasts longer than one day.  Trouble swallowing. Get help right away if:  You vomit blood or your vomit looks like coffee grounds.  You have: ? A fever. ? Bloody, black, or tarry stools. ? A severe sore throat or you cannot swallow. ? Difficulty breathing. ? Severe pain in your chest or abdomen. Summary  After the procedure, it is common to have a sore throat, mild stomach discomfort, bloating, and nausea.  Do not drive for 24 hours if you were given a sedative during the procedure.  Follow instructions from your health care provider about what to eat or drink after your procedure.  Return to your normal activities as told by your health care provider. This information is not intended to replace advice given to you by your health care provider. Make sure you discuss any questions you have with your health care provider. Document Released: 03/07/2012 Document Revised:  02/28/2018 Document Reviewed: 02/06/2018 Elsevier Patient Education  [place].  Colonoscopy, Adult, Care After This sheet gives you information about how to care for yourself after your procedure. Your health care provider may also give you more specific instructions. If you have problems or questions, contact your health care provider. What can I expect after the procedure? After the procedure, it is common to have:  A small amount of blood in your stool for 24 hours after the procedure.  Some gas.  Mild abdominal cramping or bloating. Follow these instructions at home: General instructions  For  the first 24 hours after the procedure: ? Do not drive or use machinery. ? Do not sign important documents. ? Do not drink alcohol. ? Do your regular daily activities at a slower pace than normal. ? Eat soft, easy-to-digest foods.  Take over-the-counter or prescription medicines only as told by your health care provider. Relieving cramping and bloating   Try walking around when you have cramps or feel bloated.  Apply heat to your abdomen as told by your health care provider. Use a heat source that your health care provider recommends, such as a moist heat pack or a heating pad. ? Place a towel between your skin and the heat source. ? Leave the heat on for 20-30 minutes. ? Remove the heat if your skin turns bright red. This is especially important if you are unable to feel pain, heat, or cold. You may have a greater risk of getting burned. Eating and drinking   Drink enough fluid to keep your urine pale yellow.  Resume your normal diet as instructed by your health care provider. Avoid heavy or fried foods that are hard to digest.  Avoid drinking alcohol for as long as instructed by your health care provider. Contact a health care provider if:  You have blood in your stool 2-3 days after the procedure. Get help right away if:  You have more than a small spotting of blood in your stool.  You pass large blood clots in your stool.  Your abdomen is swollen.  You have nausea or vomiting.  You have a fever.  You have increasing abdominal pain that is not relieved with medicine. Summary  After the procedure, it is common to have a small amount of blood in your stool. You may also have mild abdominal cramping and bloating.  For the first 24 hours after the procedure, do not drive or use machinery, sign important documents, or drink alcohol.  Contact your health care provider if you have a lot of blood in your stool, nausea or vomiting, a fever, or increased abdominal pain. This  information is not intended to replace advice given to you by your health care provider. Make sure you discuss any questions you have with your health care provider. Document Released: 04/20/2004 Document Revised: 06/29/2017 Document Reviewed: 11/18/2015 Elsevier Patient Education  2020 [place] After These instructions provide you with information about caring for yourself after your procedure. Your health care provider may also give you more specific instructions. Your treatment has been planned according to current medical practices, but problems sometimes occur. Call your health care provider if you have any problems or questions after your procedure. What can I expect after the procedure? After your procedure, you may:  Feel sleepy for several hours.  Feel clumsy and have poor balance for several hours.  Feel forgetful about what happened after the procedure.  Have poor judgment for  several hours.  Feel nauseous or vomit.  Have a sore throat if you had a breathing tube during the procedure. Follow these instructions at home: For at least 24 hours after the procedure:      Have a responsible adult stay with you. It is important to have someone help care for you until you are awake and alert.  Rest as needed.  Do not: ? Participate in activities in which you could fall or become injured. ? Drive. ? Use heavy machinery. ? Drink alcohol. ? Take sleeping pills or medicines that cause drowsiness. ? Make important decisions or sign legal documents. ? Take care of children on your own. Eating and drinking  Follow the diet that is recommended by your health care provider.  If you vomit, drink water, juice, or soup when you can drink without vomiting.  Make sure you have little or no nausea before eating solid foods. General instructions  Take over-the-counter and prescription medicines only as told by your health care provider.  If you  have sleep apnea, surgery and certain medicines can increase your risk for breathing problems. Follow instructions from your health care provider about wearing your sleep device: ? Anytime you are sleeping, including during daytime naps. ? While taking prescription pain medicines, sleeping medicines, or medicines that make you drowsy.  If you smoke, do not smoke without supervision.  Keep all follow-up visits as told by your health care provider. This is important. Contact a health care provider if:  You keep feeling nauseous or you keep vomiting.  You feel light-headed.  You develop a rash.  You have a fever. Get help right away if:  You have trouble breathing. Summary  For several hours after your procedure, you may feel sleepy and have poor judgment.  Have a responsible adult stay with you for at least 24 hours or until you are awake and alert. This information is not intended to replace advice given to you by your health care provider. Make sure you discuss any questions you have with your health care provider. Document Released: 12/28/2015 Document Revised: 12/05/2017 Document Reviewed: 12/28/2015 Elsevier Patient Education  2020 [employer].

## 2019-08-09 ENCOUNTER — Encounter (HOSPITAL_COMMUNITY)
Admission: RE | Admit: 2019-08-09 | Discharge: 2019-08-09 | Disposition: A | Discharge status: Home / Self Care | Payer: PPO | Source: Ambulatory Visit | Attending: Internal Medicine | Admitting: Internal Medicine

## 2019-08-09 ENCOUNTER — Other Ambulatory Visit (HOSPITAL_COMMUNITY)
Admission: RE | Admit: 2019-08-09 | Discharge: 2019-08-09 | Disposition: A | Discharge status: Home / Self Care | Payer: PPO | Source: Ambulatory Visit | Attending: Internal Medicine | Admitting: Internal Medicine

## 2019-08-09 ENCOUNTER — Other Ambulatory Visit: Payer: Self-pay

## 2019-08-09 ENCOUNTER — Encounter (HOSPITAL_COMMUNITY): Payer: Self-pay

## 2019-08-09 DIAGNOSIS — Z20828 Contact with and (suspected) exposure to other viral communicable diseases: Secondary | ICD-10-CM | POA: Insufficient documentation

## 2019-08-09 DIAGNOSIS — Z01812 Encounter for preprocedural laboratory examination: Secondary | ICD-10-CM | POA: Insufficient documentation

## 2019-08-09 LAB — BASIC METABOLIC PANEL
Anion gap: 10 (ref 5–15)
BUN: 11 mg/dL (ref 8–23)
CO2: 27 mmol/L (ref 22–32)
Calcium: 8.7 mg/dL — ABNORMAL LOW (ref 8.9–10.3)
Chloride: 98 mmol/L (ref 98–111)
Creatinine, Ser: 0.86 mg/dL (ref 0.61–1.24)
GFR calc Af Amer: >60 mL/min (ref >60)
GFR calc non Af Amer: >60 mL/min (ref >60)
Glucose, Bld: 304 mg/dL — ABNORMAL HIGH (ref 70–99)
Potassium: 3.5 mmol/L (ref 3.5–5.1)
Sodium: 135 mmol/L (ref 135–145)

## 2019-08-09 LAB — SARS CORONAVIRUS 2 (TAT 6-24 HRS): SARS Coronavirus 2: NEGATIVE

## 2019-08-10 ENCOUNTER — Ambulatory Visit: Payer: PPO | Admitting: Podiatry

## 2019-08-13 ENCOUNTER — Encounter (HOSPITAL_COMMUNITY)
Admission: RE | Disposition: A | Discharge status: Home / Self Care | Payer: Self-pay | Source: Home / Self Care | Attending: Internal Medicine

## 2019-08-13 ENCOUNTER — Telehealth: Payer: Self-pay

## 2019-08-13 ENCOUNTER — Ambulatory Visit (HOSPITAL_COMMUNITY): Payer: PPO | Admitting: Anesthesiology

## 2019-08-13 ENCOUNTER — Ambulatory Visit (HOSPITAL_COMMUNITY)
Admission: RE | Admit: 2019-08-13 | Discharge: 2019-08-13 | Disposition: A | Discharge status: Home / Self Care | Payer: PPO | Attending: Internal Medicine | Admitting: Internal Medicine

## 2019-08-13 ENCOUNTER — Encounter (HOSPITAL_COMMUNITY): Payer: Self-pay | Admitting: *Deleted

## 2019-08-13 DIAGNOSIS — I509 Heart failure, unspecified: Secondary | ICD-10-CM | POA: Insufficient documentation

## 2019-08-13 DIAGNOSIS — G2581 Restless legs syndrome: Secondary | ICD-10-CM | POA: Diagnosis not present

## 2019-08-13 DIAGNOSIS — G4733 Obstructive sleep apnea (adult) (pediatric): Secondary | ICD-10-CM | POA: Insufficient documentation

## 2019-08-13 DIAGNOSIS — M1990 Unspecified osteoarthritis, unspecified site: Secondary | ICD-10-CM | POA: Insufficient documentation

## 2019-08-13 DIAGNOSIS — Z89422 Acquired absence of other left toe(s): Secondary | ICD-10-CM | POA: Insufficient documentation

## 2019-08-13 DIAGNOSIS — K209 Esophagitis, unspecified without bleeding: Secondary | ICD-10-CM | POA: Diagnosis not present

## 2019-08-13 DIAGNOSIS — E039 Hypothyroidism, unspecified: Secondary | ICD-10-CM | POA: Insufficient documentation

## 2019-08-13 DIAGNOSIS — R195 Other fecal abnormalities: Secondary | ICD-10-CM | POA: Diagnosis not present

## 2019-08-13 DIAGNOSIS — R1310 Dysphagia, unspecified: Secondary | ICD-10-CM | POA: Insufficient documentation

## 2019-08-13 DIAGNOSIS — I110 Hypertensive heart disease with heart failure: Secondary | ICD-10-CM | POA: Insufficient documentation

## 2019-08-13 DIAGNOSIS — Z7902 Long term (current) use of antithrombotics/antiplatelets: Secondary | ICD-10-CM | POA: Insufficient documentation

## 2019-08-13 DIAGNOSIS — K2100 Gastro-esophageal reflux disease with esophagitis, without bleeding: Secondary | ICD-10-CM | POA: Diagnosis not present

## 2019-08-13 DIAGNOSIS — Z981 Arthrodesis status: Secondary | ICD-10-CM | POA: Insufficient documentation

## 2019-08-13 DIAGNOSIS — Z8249 Family history of ischemic heart disease and other diseases of the circulatory system: Secondary | ICD-10-CM | POA: Insufficient documentation

## 2019-08-13 DIAGNOSIS — R1319 Other dysphagia: Secondary | ICD-10-CM

## 2019-08-13 DIAGNOSIS — K317 Polyp of stomach and duodenum: Secondary | ICD-10-CM | POA: Insufficient documentation

## 2019-08-13 DIAGNOSIS — Z7982 Long term (current) use of aspirin: Secondary | ICD-10-CM | POA: Diagnosis not present

## 2019-08-13 DIAGNOSIS — E1151 Type 2 diabetes mellitus with diabetic peripheral angiopathy without gangrene: Secondary | ICD-10-CM | POA: Diagnosis not present

## 2019-08-13 DIAGNOSIS — I429 Cardiomyopathy, unspecified: Secondary | ICD-10-CM | POA: Diagnosis not present

## 2019-08-13 DIAGNOSIS — Z8673 Personal history of transient ischemic attack (TIA), and cerebral infarction without residual deficits: Secondary | ICD-10-CM | POA: Insufficient documentation

## 2019-08-13 DIAGNOSIS — K449 Diaphragmatic hernia without obstruction or gangrene: Secondary | ICD-10-CM | POA: Insufficient documentation

## 2019-08-13 DIAGNOSIS — Z7984 Long term (current) use of oral hypoglycemic drugs: Secondary | ICD-10-CM | POA: Diagnosis not present

## 2019-08-13 DIAGNOSIS — Z833 Family history of diabetes mellitus: Secondary | ICD-10-CM | POA: Diagnosis not present

## 2019-08-13 DIAGNOSIS — K226 Gastro-esophageal laceration-hemorrhage syndrome: Secondary | ICD-10-CM | POA: Diagnosis not present

## 2019-08-13 DIAGNOSIS — Z888 Allergy status to other drugs, medicaments and biological substances status: Secondary | ICD-10-CM | POA: Diagnosis not present

## 2019-08-13 DIAGNOSIS — I6522 Occlusion and stenosis of left carotid artery: Secondary | ICD-10-CM | POA: Insufficient documentation

## 2019-08-13 DIAGNOSIS — Z885 Allergy status to narcotic agent status: Secondary | ICD-10-CM | POA: Diagnosis not present

## 2019-08-13 DIAGNOSIS — E119 Type 2 diabetes mellitus without complications: Secondary | ICD-10-CM | POA: Diagnosis not present

## 2019-08-13 HISTORY — PX: ESOPHAGOGASTRODUODENOSCOPY (EGD) WITH PROPOFOL: SHX5813

## 2019-08-13 HISTORY — PX: FLEXIBLE SIGMOIDOSCOPY: SHX5431

## 2019-08-13 LAB — GLUCOSE, CAPILLARY: Glucose-Capillary: 275 mg/dL — ABNORMAL HIGH (ref 70–99)

## 2019-08-13 LAB — HEMOGLOBIN AND HEMATOCRIT, BLOOD
HCT: 32.1 % — ABNORMAL LOW (ref 39.0–52.0)
Hemoglobin: 10.7 g/dL — ABNORMAL LOW (ref 13.0–17.0)

## 2019-08-13 SURGERY — ESOPHAGOGASTRODUODENOSCOPY (EGD) WITH PROPOFOL
Anesthesia: General

## 2019-08-13 MED ORDER — PROMETHAZINE HCL 25 MG/ML IJ SOLN: 6.2500 mg | INTRAMUSCULAR | Status: DC | PRN

## 2019-08-13 MED ORDER — CHLORHEXIDINE GLUCONATE CLOTH 2 % EX PADS: 6.0000 | MEDICATED_PAD | Freq: Once | CUTANEOUS | Status: DC

## 2019-08-13 MED ORDER — LACTATED RINGERS IV SOLN: INTRAVENOUS | Status: DC

## 2019-08-13 MED ORDER — PROPOFOL 10 MG/ML IV BOLUS
INTRAVENOUS | Status: DC | PRN
  Administered 2019-08-13 (×2): 20 mg via INTRAVENOUS

## 2019-08-13 MED ORDER — PROPOFOL 500 MG/50ML IV EMUL
INTRAVENOUS | Status: DC | PRN
  Administered 2019-08-13: 200 ug/kg/min via INTRAVENOUS

## 2019-08-13 MED ORDER — MIDAZOLAM HCL 2 MG/2ML IJ SOLN: 0.5000 mg | Freq: Once | INTRAMUSCULAR | Status: DC | PRN

## 2019-08-13 MED ORDER — GLYCOPYRROLATE 0.2 MG/ML IJ SOLN
INTRAMUSCULAR | Status: DC | PRN
  Administered 2019-08-13: 0.2 mg via INTRAVENOUS

## 2019-08-13 MED ORDER — KETAMINE HCL 10 MG/ML IJ SOLN
INTRAMUSCULAR | Status: DC | PRN
  Administered 2019-08-13: 15 mg via INTRAVENOUS

## 2019-08-13 MED ORDER — LACTATED RINGERS IV SOLN
INTRAVENOUS | Status: DC | PRN
  Administered 2019-08-13: 11:00:00 via INTRAVENOUS

## 2019-08-13 MED ORDER — GLYCOPYRROLATE PF 0.2 MG/ML IJ SOSY
PREFILLED_SYRINGE | INTRAMUSCULAR | Status: AC
  Filled 2019-08-13: qty 1

## 2019-08-13 MED ORDER — KETAMINE HCL 50 MG/5ML IJ SOSY
PREFILLED_SYRINGE | INTRAMUSCULAR | Status: AC
  Filled 2019-08-13: qty 5

## 2019-08-13 NOTE — Anesthesia Preprocedure Evaluation (Signed)
Anesthesia Evaluation  Patient identified by MRN, date of birth, ID band Patient awake    Reviewed: Allergy & Precautions, NPO status , Patient's Chart, lab work & pertinent test results  History of Anesthesia Complications (+) PONV and history of anesthetic complications  Airway Mallampati: II  TM Distance: >3 FB Neck ROM: Full    Dental no notable dental hx. (+) Edentulous Upper, Edentulous Lower   Pulmonary shortness of breath and with exertion, sleep apnea and Continuous Positive Airway Pressure Ventilation , pneumonia, resolved, former smoker,    Pulmonary exam normal breath sounds clear to auscultation       Cardiovascular Exercise Tolerance: Poor hypertension, Pt. on medications + angina with exertion + Peripheral Vascular Disease and +CHF  Normal cardiovascular examII Rhythm:Regular Rate:Normal  Denies NTG use Ever   Neuro/Psych  Neuromuscular disease CVA, Residual Symptoms negative psych ROS   GI/Hepatic Neg liver ROS, GERD  Medicated and Controlled,  Endo/Other  diabetes, Type 2, Oral Hypoglycemic AgentsHypothyroidism   Renal/GU negative Renal ROS  negative genitourinary   Musculoskeletal  (+) Arthritis , Osteoarthritis,    Abdominal   Peds negative pediatric ROS (+)  Hematology negative hematology ROS (+)   Anesthesia Other Findings   Reproductive/Obstetrics negative OB ROS                             Anesthesia Physical Anesthesia Plan  ASA: IV  Anesthesia Plan: General   Post-op Pain Management:    Induction: Intravenous  PONV Risk Score and Plan: 3 and TIVA, Propofol infusion, Ondansetron and Treatment may vary due to age or medical condition  Airway Management Planned: Nasal Cannula and Simple Face Mask  Additional Equipment:   Intra-op Plan:   Post-operative Plan:   Informed Consent: I have reviewed the patients History and Physical, chart, labs and  discussed the procedure including the risks, benefits and alternatives for the proposed anesthesia with the patient or authorized representative who has indicated his/her understanding and acceptance.     Dental advisory given  Plan Discussed with: CRNA  Anesthesia Plan Comments: (Plan Full PPE use  Plan GA with GETA as needed d/w pt -WTP with same after Q&A)        Anesthesia Quick Evaluation

## 2019-08-13 NOTE — Telephone Encounter (Signed)
-----   Message from [name], MD sent at 08/13/2019 11:21 AM EST ----- Will need to reschedule EGD/ TCS in about 4 weeks; he needs an OV. I WILL REQUIRE HE COME OFF PLAVIX X 7 DAYS IF HE CANT, MAY NEED TO [facility].  SEE PROCEDURE NOTES

## 2019-08-13 NOTE — Op Note (Signed)
[facility] Patient Name: [name] Procedure Date: 08/13/2019 10:30 AM MRN: [id number] Date of Birth: [birth year] Attending MD: [name] , MD CSN: [id number] Age: 73 Admit Type: Outpatient Procedure:                Upper GI endoscopy Indications:              Dysphagia Providers:                Christopher Richards, MD, Hinton Rao, RN,                            Raphael Gibney, Technician Referring MD:              Medicines:                Propofol per Anesthesia Complications:            No immediate complications. Estimated Blood Loss:     Estimated blood loss was minimal. Procedure:                Pre-Anesthesia Assessment:                           - Prior to the procedure, a History and Physical                            was performed, and patient medications and                            allergies were reviewed. The patient's tolerance of                            previous anesthesia was also reviewed. The risks                            and benefits of the procedure and the sedation                            options and risks were discussed with the patient.                            All questions were answered, and informed consent                            was obtained. Prior Anticoagulants: The patient                            last took Plavix (clopidogrel) 1 day prior to the                            procedure. ASA Grade Assessment: III - A patient                            with severe systemic disease. After reviewing the  risks and benefits, the patient was deemed in                            satisfactory condition to undergo the procedure.                           After obtaining informed consent, the endoscope was                            passed under direct vision. Throughout the                            procedure, the patient's blood pressure, pulse, and                            oxygen saturations  were monitored continuously. The                            [id number]) scope was introduced through the                            mouth, and advanced to the second part of duodenum.                            The upper GI endoscopy was accomplished without                            difficulty. The patient tolerated the procedure                            well. Scope In: 10:49:29 AM Scope Out: 10:55:03 AM Total Procedure Duration: 0 hours 5 minutes 34 seconds  Findings:      3 x 7 mm erosion straddling the GE junction with overlying fresh clot       (patient heaved a couple times prior to debating the esophagus; here to       have a mild, noncritical stricture at this level. No tumor or Barrett's       epithelium seen.      A medium-sized hiatal hernia was present. Multiple a 6-8 mm       benign-appearing gastric polyp seen. Some old blood in stomach. Pylorus       patent.      The duodenal bulb and second portion of the duodenum were normal. Unable       to dilate the esophagus today because of pre-existing trauma and       friability of the GE junction. Impression:               Erosive reflux esophagitis with mild stricture and                            incidental Mallory-Weiss tear -occluded esophageal                            dilation today.                           -  Medium-sized hiatal hernia.                           - Normal duodenal bulb and second portion of the                            duodenum.                           - No specimens collected. Moderate Sedation:      Moderate (conscious) sedation was personally administered by an       anesthesia professional. The following parameters were monitored: oxygen       saturation, heart rate, blood pressure, respiratory rate, EKG, adequacy       of pulmonary ventilation, and response to care. Recommendation:           - Patient has a contact number available for                            emergencies. The  signs and symptoms of potential                            delayed complications were discussed with the                            patient. Return to normal activities tomorrow.                            Written discharge instructions were provided to the                            patient. Patient will need to return in a few weeks                            for second attempt EGD with esophageal dilation. In                            this setting, it would be required comes off Plavix                            for 7 days prior to the procedure' see colonoscopy                            report.                           - Advance diet as tolerated. Begin Protonix 40 mg                            daily. Procedure Code(s):        --- Professional ---                           [phone], Esophagogastroduodenoscopy, flexible,  transoral; diagnostic, including collection of                            specimen(s) by brushing or washing, when performed                            (separate procedure) Diagnosis Code(s):        --- Professional ---                           K20.90, Esophagitis, unspecified without bleeding                           K44.9, Diaphragmatic hernia without obstruction or                            gangrene                           R13.10, Dysphagia, unspecified CPT copyright 2019 American Medical Association. All rights reserved. The codes documented in this report are preliminary and upon coder review may  be revised to meet current compliance requirements. [name]. [name], MD Christopher Richards, MD 08/13/2019 11:18:02 AM This report has been signed electronically. Number of Addenda: 0

## 2019-08-13 NOTE — Transfer of Care (Signed)
Immediate Anesthesia Transfer of Care Note  Patient: [name]  Procedure(s) Performed: COLONOSCOPY WITH PROPOFOL (N/A ) ESOPHAGOGASTRODUODENOSCOPY (EGD) WITH PROPOFOL (N/A )  Patient Location: PACU  Anesthesia Type:General  Level of Consciousness: awake and patient cooperative  Airway & Oxygen Therapy: Patient Spontanous Breathing  Post-op Assessment: Report given to RN and Post -op Vital signs reviewed and stable  Post vital signs: Reviewed and stable  Last Vitals:  Vitals Value Taken Time  BP 118/65   Temp 97.8   Pulse 74 08/13/19 1111  Resp    SpO2 98 % 08/13/19 1111  Vitals shown include unvalidated device data.  Last Pain:  Vitals:   08/13/19 0915  TempSrc: Oral  PainSc: 0-No pain      Patients Stated Pain Goal: 8 ([id number]  Complications: No apparent anesthesia complications  SEE PACU FLOW SHEET FOR VITAL SIGNS

## 2019-08-13 NOTE — Telephone Encounter (Signed)
Pt needs OV to reschedule procedure per RMR.

## 2019-08-13 NOTE — Telephone Encounter (Signed)
He will need TCS/EGD/ED with propofol. RMR wanting done in four weeks.   Is there a place to put him in four weeks? Let's bring him into the office a little sooner in case we end up with opening.

## 2019-08-13 NOTE — Anesthesia Postprocedure Evaluation (Signed)
Anesthesia Post Note  Patient: [name]  Procedure(s) Performed: COLONOSCOPY WITH PROPOFOL (N/A ) ESOPHAGOGASTRODUODENOSCOPY (EGD) WITH PROPOFOL (N/A )  Patient location during evaluation: PACU Anesthesia Type: General Level of consciousness: awake and alert and patient cooperative Pain management: satisfactory to patient Vital Signs Assessment: post-procedure vital signs reviewed and stable Respiratory status: spontaneous breathing Cardiovascular status: stable Postop Assessment: no apparent nausea or vomiting Anesthetic complications: no     Last Vitals:  Vitals:   08/13/19 1108 08/13/19 1115  BP: 118/65   Pulse: 74 73  Resp: 16 15  Temp: 36.6 C   SpO2: 98% 97%    Last Pain:  Vitals:   08/13/19 1108  TempSrc:   PainSc: Asleep                 Marbeth Smedley

## 2019-08-13 NOTE — Anesthesia Procedure Notes (Signed)
Date/Time: 08/13/2019 10:37 AM Performed by: Vista Deck, CRNA Pre-anesthesia Checklist: Patient identified, Emergency Drugs available, Suction available, Timeout performed and Patient being monitored Patient Re-evaluated:Patient Re-evaluated prior to induction Oxygen Delivery Method: Nasal Cannula
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
## 2019-08-13 NOTE — Discharge Instructions (Signed)
°Colonoscopy °Discharge Instructions ° °Read the instructions outlined below and refer to this sheet in the next few weeks. These discharge instructions provide you with general information on caring for yourself after you leave the hospital. Your doctor may also give you specific instructions. While your treatment has been planned according to the most current medical practices available, unavoidable complications occasionally occur. If you have any problems or questions after discharge, call Dr. Rourk at [phone]. °ACTIVITY °· You may resume your regular activity, but move at a slower pace for the next 24 hours.  °· Take frequent rest periods for the next 24 hours.  °· Walking will help get rid of the air and reduce the bloated feeling in your belly (abdomen).  °· No driving for 24 hours (because of the medicine (anesthesia) used during the test).   °· Do not sign any important legal documents or operate any machinery for 24 hours (because of the anesthesia used during the test).  °NUTRITION °· Drink plenty of fluids.  °· You may resume your normal diet as instructed by your doctor.  °· Begin with a light meal and progress to your normal diet. Heavy or fried foods are harder to digest and may make you feel sick to your stomach (nauseated).  °· Avoid alcoholic beverages for 24 hours or as instructed.  °MEDICATIONS °· You may resume your normal medications unless your doctor tells you otherwise.  °WHAT YOU CAN EXPECT TODAY °· Some feelings of bloating in the abdomen.  °· Passage of more gas than usual.  °· Spotting of blood in your stool or on the toilet paper.  °IF YOU HAD POLYPS REMOVED DURING THE COLONOSCOPY: °· No aspirin products for 7 days or as instructed.  °· No alcohol for 7 days or as instructed.  °· Eat a soft diet for the next 24 hours.  °FINDING OUT THE RESULTS OF YOUR TEST °Not all test results are available during your visit. If your test results are not back during the visit, make an appointment  with your caregiver to find out the results. Do not assume everything is normal if you have not heard from your caregiver or the medical facility. It is important for you to follow up on all of your test results.  °SEEK IMMEDIATE MEDICAL ATTENTION IF: °· You have more than a spotting of blood in your stool.  °· Your belly is swollen (abdominal distention).  °· You are nauseated or vomiting.  °· You have a temperature over 101.  °· You have abdominal pain or discomfort that is severe or gets worse throughout the day.  °EGD °Discharge instructions °Please read the instructions outlined below and refer to this sheet in the next few weeks. These discharge instructions provide you with general information on caring for yourself after you leave the hospital. Your doctor may also give you specific instructions. While your treatment has been planned according to the most current medical practices available, unavoidable complications occasionally occur. If you have any problems or questions after discharge, please call your doctor. °ACTIVITY °· You may resume your regular activity but move at a slower pace for the next 24 hours.  °· Take frequent rest periods for the next 24 hours.  °· Walking will help expel (get rid of) the air and reduce the bloated feeling in your abdomen.  °· No driving for 24 hours (because of the anesthesia (medicine) used during the test).  °· You may shower.  °· Do not sign any important   legal documents or operate any machinery for 24 hours (because of the anesthesia used during the test).  NUTRITION  Drink plenty of fluids.   You may resume your normal diet.   Begin with a light meal and progress to your normal diet.   Avoid alcoholic beverages for 24 hours or as instructed by your caregiver.  MEDICATIONS  You may resume your normal medications unless your caregiver tells you otherwise.  WHAT YOU CAN EXPECT TODAY  You may experience abdominal discomfort such as a feeling of fullness  or gas pains.  FOLLOW-UP  Your doctor will discuss the results of your test with you.  SEEK IMMEDIATE MEDICAL ATTENTION IF ANY OF THE FOLLOWING OCCUR:  Excessive nausea (feeling sick to your stomach) and/or vomiting.   Severe abdominal pain and distention (swelling).   Trouble swallowing.   Temperature over 101 F (37.8 C).   Rectal bleeding or vomiting of blood.   You have acid reflux inflammation in your esophagus  Your esophagus easily bled today and could not be dilated  Begin Protonix 40 mg daily  Likewise, your colonoscopy could not be done because you were not cleaned out  We will plan to see you back in about 3 to 4 weeks and get you rescheduled.  We need to come up with a prep that works.  Also, you must be able to come off Plavix for 7 days prior to repeat procedure  At patient request, I called Trellis Moment, [phone] and left a message      Gastroesophageal Reflux Disease, Adult Gastroesophageal reflux (GER) happens when acid from the stomach flows up into the tube that connects the mouth and the stomach (esophagus). Normally, food travels down the esophagus and stays in the stomach to be digested. With GER, food and stomach acid sometimes move back up into the esophagus. You may have a disease called gastroesophageal reflux disease (GERD) if the reflux:  Happens often.  Causes frequent or very bad symptoms.  Causes problems such as damage to the esophagus. When this happens, the esophagus becomes sore and swollen (inflamed). Over time, GERD can make small holes (ulcers) in the lining of the esophagus. What are the causes? This condition is caused by a problem with the muscle between the esophagus and the stomach. When this muscle is weak or not normal, it does not close properly to keep food and acid from coming back up from the stomach. The muscle can be weak because of:  Tobacco use.  Pregnancy.  Having a certain type of hernia (hiatal  hernia).  Alcohol use.  Certain foods and drinks, such as coffee, chocolate, onions, and peppermint. What increases the risk? You are more likely to develop this condition if you:  Are overweight.  Have a disease that affects your connective tissue.  Use NSAID medicines. What are the signs or symptoms? Symptoms of this condition include:  Heartburn.  Difficult or painful swallowing.  The feeling of having a lump in the throat.  A bitter taste in the mouth.  Bad breath.  Having a lot of saliva.  Having an upset or bloated stomach.  Belching.  Chest pain. Different conditions can cause chest pain. Make sure you see your doctor if you have chest pain.  Shortness of breath or noisy breathing (wheezing).  Ongoing (chronic) cough or a cough at night.  Wearing away of the surface of teeth (tooth enamel).  Weight loss. How is this treated? Treatment will depend on how bad your symptoms  are. Your doctor may suggest:  Changes to your diet.  Medicine.  Surgery. Follow these instructions at home: Eating and drinking   Follow a diet as told by your doctor. You may need to avoid foods and drinks such as: ? Coffee and tea (with or without caffeine). ? Drinks that contain alcohol. ? Energy drinks and sports drinks. ? Bubbly (carbonated) drinks or sodas. ? Chocolate and cocoa. ? Peppermint and mint flavorings. ? Garlic and onions. ? Horseradish. ? Spicy and acidic foods. These include peppers, chili powder, curry powder, vinegar, hot sauces, and BBQ sauce. ? Citrus fruit juices and citrus fruits, such as oranges, lemons, and limes. ? Tomato-based foods. These include red sauce, chili, salsa, and pizza with red sauce. ? Fried and fatty foods. These include donuts, french fries, potato chips, and high-fat dressings. ? High-fat meats. These include hot dogs, rib eye steak, sausage, ham, and bacon. ? High-fat dairy items, such as whole milk, butter, and cream  cheese.  Eat small meals often. Avoid eating large meals.  Avoid drinking large amounts of liquid with your meals.  Avoid eating meals during the 2-3 hours before bedtime.  Avoid lying down right after you eat.  Do not exercise right after you eat. Lifestyle   Do not use any products that contain nicotine or tobacco. These include cigarettes, e-cigarettes, and chewing tobacco. If you need help quitting, ask your doctor.  Try to lower your stress. If you need help doing this, ask your doctor.  If you are overweight, lose an amount of weight that is healthy for you. Ask your doctor about a safe weight loss goal. General instructions  Pay attention to any changes in your symptoms.  Take over-the-counter and prescription medicines only as told by your doctor. Do not take aspirin, ibuprofen, or other NSAIDs unless your doctor says it is okay.  Wear loose clothes. Do not wear anything tight around your waist.  Raise (elevate) the head of your bed about 6 inches (15 cm).  Avoid bending over if this makes your symptoms worse.  Keep all follow-up visits as told by your doctor. This is important. Contact a doctor if:  You have new symptoms.  You lose weight and you do not know why.  You have trouble swallowing or it hurts to swallow.  You have wheezing or a cough that keeps happening.  Your symptoms do not get better with treatment.  You have a hoarse voice. Get help right away if:  You have pain in your arms, neck, jaw, teeth, or back.  You feel sweaty, dizzy, or light-headed.  You have chest pain or shortness of breath.  You throw up (vomit) and your throw-up looks like blood or coffee grounds.  You pass out (faint).  Your poop (stool) is bloody or black.  You cannot swallow, drink, or eat. Summary  If a person has gastroesophageal reflux disease (GERD), food and stomach acid move back up into the esophagus and cause symptoms or problems such as damage to the  esophagus.  Treatment will depend on how bad your symptoms are.  Follow a diet as told by your doctor.  Take all medicines only as told by your doctor. This information is not intended to replace advice given to you by your health care provider. Make sure you discuss any questions you have with your health care provider. Document Released: 02/23/2008 Document Revised: 03/15/2018 Document Reviewed: 03/15/2018 Elsevier Patient Education  2020 [place],  Care After These instructions provide you with information about caring for yourself after your procedure. Your health care provider may also give you more specific instructions. Your treatment has been planned according to current medical practices, but problems sometimes occur. Call your health care provider if you have any problems or questions after your procedure. What can I expect after the procedure? After your procedure, you may:  Feel sleepy for several hours.  Feel clumsy and have poor balance for several hours.  Feel forgetful about what happened after the procedure.  Have poor judgment for several hours.  Feel nauseous or vomit.  Have a sore throat if you had a breathing tube during the procedure. Follow these instructions at home: For at least 24 hours after the procedure:      Have a responsible adult stay with you. It is important to have someone help care for you until you are awake and alert.  Rest as needed.  Do not: ? Participate in activities in which you could fall or become injured. ? Drive. ? Use heavy machinery. ? Drink alcohol. ? Take sleeping pills or medicines that cause drowsiness. ? Make important decisions or sign legal documents. ? Take care of children on your own. Eating and drinking  Follow the diet that is recommended by your health care provider.  If you vomit, drink water, juice, or soup when you can drink without vomiting.  Make sure you have  little or no nausea before eating solid foods. General instructions  Take over-the-counter and prescription medicines only as told by your health care provider.  If you have sleep apnea, surgery and certain medicines can increase your risk for breathing problems. Follow instructions from your health care provider about wearing your sleep device: ? Anytime you are sleeping, including during daytime naps. ? While taking prescription pain medicines, sleeping medicines, or medicines that make you drowsy.  If you smoke, do not smoke without supervision.  Keep all follow-up visits as told by your health care provider. This is important. Contact a health care provider if:  You keep feeling nauseous or you keep vomiting.  You feel light-headed.  You develop a rash.  You have a fever. Get help right away if:  You have trouble breathing. Summary  For several hours after your procedure, you may feel sleepy and have poor judgment.  Have a responsible adult stay with you for at least 24 hours or until you are awake and alert. This information is not intended to replace advice given to you by your health care provider. Make sure you discuss any questions you have with your health care provider. Document Released: 12/28/2015 Document Revised: 12/05/2017 Document Reviewed: 12/28/2015 Elsevier Patient Education  2020 [employer].

## 2019-08-13 NOTE — Op Note (Signed)
[facility] Patient Name: [name] Procedure Date: 08/13/2019 10:58 AM MRN: [id number] Date of Birth: Nov 21, 1945 Attending MD: Norvel Richards , MD CSN: [id number] Age: 73 Admit Type: Outpatient Procedure:                Colonoscopy -aborted Indications:              Positive Cologuard test Providers:                Norvel Richards, MD, Hinton Rao, RN,                            Raphael Gibney, Technician Referring MD:              Medicines:                Propofol per Anesthesia Complications:            No immediate complications. Estimated Blood Loss:     Estimated blood loss: none. Procedure:                Pre-Anesthesia Assessment:                           - Prior to the procedure, a History and Physical                            was performed, and patient medications and                            allergies were reviewed. The patient's tolerance of                            previous anesthesia was also reviewed. The risks                            and benefits of the procedure and the sedation                            options and risks were discussed with the patient.                            All questions were answered, and informed consent                            was obtained. Prior Anticoagulants: The patient                            last took Plavix (clopidogrel) 1 day prior to the                            procedure. ASA Grade Assessment: III - A patient                            with severe systemic disease. After reviewing the  risks and benefits, the patient was deemed in                            satisfactory condition to undergo the procedure.                           After obtaining informed consent, the colonoscope                            was passed under direct vision. Throughout the                            procedure, the patient's blood pressure, pulse, and                            oxygen  saturations were monitored continuously. The                            procedure was aborted. The colonscope was inserted.                            Medications were given. Scope In: 11:00:35 AM Scope Out: 11:02:05 AM Total Procedure Duration: 0 hours 1 minute 30 seconds  Findings:      The perianal and digital rectal examinations were normal. Scopic       visualization of the rectum revealed the patient was not adequately       prepped with formed and semiformed stool lining and occluding the rectal       lumen trailing upstream with this finding, the procedure was terminated. Impression:               -Prep inadequate to perform colonoscopy today. Moderate Sedation:      Moderate (conscious) sedation was personally administered by an       anesthesia professional. The following parameters were monitored: oxygen       saturation, heart rate, blood pressure, respiratory rate, EKG, adequacy       of pulmonary ventilation, and response to care. Recommendation:           - Patient has a contact number available for                            emergencies. The signs and symptoms of potential                            delayed complications were discussed with the                            patient. Return to normal activities tomorrow.                            Written discharge instructions were provided to the                            patient.                           -  Resume previous diet.                           - Continue present medications.                           - Repeat colonoscopy in 4 weeks because the bowel                            preparation was poor.                           - Return to GI clinic (date not yet determined).                            See EGD report. Procedure Code(s):         Diagnosis Code(s):        --- Professional ---                           R19.5, Other fecal abnormalities CPT copyright 2019 [employer]. All rights  reserved. The codes documented in this report are preliminary and upon coder review may  be revised to meet current compliance requirements. [name]. Nykira Reddix, MD Norvel Richards, MD 08/13/2019 11:21:23 AM This report has been signed electronically. Number of Addenda: 0

## 2019-08-13 NOTE — Telephone Encounter (Signed)
PATIENT SCHEDULED TO COME BACK IN 4 WEEKS

## 2019-08-14 NOTE — Telephone Encounter (Signed)
PATIENT COMING BECK HERE 09/03/19

## 2019-08-14 NOTE — Telephone Encounter (Signed)
Holding spot for TCS/EGD/DIL w/Propofol w/RMR 10/01/19 (date ok per RMR).  Called and informed pt, he is agreeable to having procedure 10/01/19. Informed of OV 09/03/19 at 10:30am w/LSL.  FYI to LSL and SS.

## 2019-08-14 NOTE — Telephone Encounter (Signed)
Noted. Thanks.

## 2019-08-15 ENCOUNTER — Encounter (HOSPITAL_COMMUNITY): Payer: Self-pay | Admitting: Internal Medicine

## 2019-08-21 ENCOUNTER — Ambulatory Visit (HOSPITAL_COMMUNITY)
Admission: RE | Admit: 2019-08-21 | Discharge: 2019-08-21 | Disposition: A | Discharge status: Home / Self Care | Payer: PPO | Source: Ambulatory Visit | Attending: Cardiology | Admitting: Cardiology

## 2019-08-21 ENCOUNTER — Other Ambulatory Visit: Payer: Self-pay | Admitting: Cardiovascular Disease

## 2019-08-21 ENCOUNTER — Other Ambulatory Visit: Payer: Self-pay

## 2019-08-21 DIAGNOSIS — I739 Peripheral vascular disease, unspecified: Secondary | ICD-10-CM

## 2019-08-21 DIAGNOSIS — I10 Essential (primary) hypertension: Secondary | ICD-10-CM

## 2019-08-28 ENCOUNTER — Ambulatory Visit: Payer: PPO | Admitting: Podiatry

## 2019-08-28 ENCOUNTER — Ambulatory Visit (INDEPENDENT_AMBULATORY_CARE_PROVIDER_SITE_OTHER): Payer: PPO

## 2019-08-28 ENCOUNTER — Other Ambulatory Visit: Payer: Self-pay

## 2019-08-28 DIAGNOSIS — B351 Tinea unguium: Secondary | ICD-10-CM

## 2019-08-28 DIAGNOSIS — L97415 Non-pressure chronic ulcer of right heel and midfoot with muscle involvement without evidence of necrosis: Secondary | ICD-10-CM

## 2019-08-28 DIAGNOSIS — E08621 Diabetes mellitus due to underlying condition with foot ulcer: Secondary | ICD-10-CM

## 2019-08-28 DIAGNOSIS — E1142 Type 2 diabetes mellitus with diabetic polyneuropathy: Secondary | ICD-10-CM

## 2019-08-28 DIAGNOSIS — E1169 Type 2 diabetes mellitus with other specified complication: Secondary | ICD-10-CM

## 2019-08-28 NOTE — Patient Instructions (Signed)
Diabetes Mellitus and Foot Care Foot care is an important part of your health, especially when you have diabetes. Diabetes may cause you to have problems because of poor blood flow (circulation) to your feet and legs, which can cause your skin to:  Become thinner and drier.  Break more easily.  Heal more slowly.  Peel and crack. You may also have nerve damage (neuropathy) in your legs and feet, causing decreased feeling in them. This means that you may not notice minor injuries to your feet that could lead to more serious problems. Noticing and addressing any potential problems early is the best way to prevent future foot problems. How to care for your feet Foot hygiene  Wash your feet daily with warm water and mild soap. Do not use hot water. Then, pat your feet and the areas between your toes until they are completely dry. Do not soak your feet as this can dry your skin.  Trim your toenails straight across. Do not dig under them or around the cuticle. File the edges of your nails with an emery board or nail file.  Apply a moisturizing lotion or petroleum jelly to the skin on your feet and to dry, brittle toenails. Use lotion that does not contain alcohol and is unscented. Do not apply lotion between your toes. Shoes and socks  Wear clean socks or stockings every day. Make sure they are not too tight. Do not wear knee-high stockings since they may decrease blood flow to your legs.  Wear shoes that fit properly and have enough cushioning. Always look in your shoes before you put them on to be sure there are no objects inside.  To break in new shoes, wear them for just a few hours a day. This prevents injuries on your feet. Wounds, scrapes, corns, and calluses  Check your feet daily for blisters, cuts, bruises, sores, and redness. If you cannot see the bottom of your feet, use a mirror or ask someone for help.  Do not cut corns or calluses or try to remove them with medicine.  If you  find a minor scrape, cut, or break in the skin on your feet, keep it and the skin around it clean and dry. You may clean these areas with mild soap and water. Do not clean the area with peroxide, alcohol, or iodine.  If you have a wound, scrape, corn, or callus on your foot, look at it several times a day to make sure it is healing and not infected. Check for: ? Redness, swelling, or pain. ? Fluid or blood. ? Warmth. ? Pus or a bad smell. General instructions  Do not cross your legs. This may decrease blood flow to your feet.  Do not use heating pads or hot water bottles on your feet. They may burn your skin. If you have lost feeling in your feet or legs, you may not know this is happening until it is too late.  Protect your feet from hot and cold by wearing shoes, such as at the beach or on hot pavement.  Schedule a complete foot exam at least once a year (annually) or more often if you have foot problems. If you have foot problems, report any cuts, sores, or bruises to your health care provider immediately. Contact a health care provider if:  You have a medical condition that increases your risk of infection and you have any cuts, sores, or bruises on your feet.  You have an injury that is not   healing.  You have redness on your legs or feet.  You feel burning or tingling in your legs or feet.  You have pain or cramps in your legs and feet.  Your legs or feet are numb.  Your feet always feel cold.  You have pain around a toenail. Get help right away if:  You have a wound, scrape, corn, or callus on your foot and: ? You have pain, swelling, or redness that gets worse. ? You have fluid or blood coming from the wound, scrape, corn, or callus. ? Your wound, scrape, corn, or callus feels warm to the touch. ? You have pus or a bad smell coming from the wound, scrape, corn, or callus. ? You have a fever. ? You have a red line going up your leg. Summary  Check your feet every day  for cuts, sores, red spots, swelling, and blisters.  Moisturize feet and legs daily.  Wear shoes that fit properly and have enough cushioning.  If you have foot problems, report any cuts, sores, or bruises to your health care provider immediately.  Schedule a complete foot exam at least once a year (annually) or more often if you have foot problems. This information is not intended to replace advice given to you by your health care provider. Make sure you discuss any questions you have with your health care provider. Document Released: 09/03/2000 Document Revised: 10/19/2017 Document Reviewed: 10/08/2016 Elsevier Patient Education  2020 [employer]  

## 2019-08-29 ENCOUNTER — Encounter: Payer: Self-pay | Admitting: Podiatry

## 2019-08-29 NOTE — Progress Notes (Signed)
Subjective: [name] is a pleasant 73 y.o. male who presents today for at-risk diabetic foot care. He has h/o partial hallux amputation left hallux.   [name] also has chronic ulceration submet head 1 right foot.  Patient states he has been applying OTC antibiotic ointment to ulcer of right foot daily as he has been out of wound care supplies.    He missed his last visit with Dr. March [name] for his ulceration and states he missed it  due to having to have a pre-procedure COVID test which required him to quarantine afterwards.   He denies any fever, chills, nightsweats, nausea or vomiting.  Christopher Du, MD is his PCP.   Allergies  Allergen Reactions  . Quinoline Yellow Ss [Yellow Dye #11]     Pt unsure   . Codeine Nausea And Vomiting  . Tramadol Nausea Only   Objective:  Vascular Examination: Capillary refill time to digits <3 seconds b/l.   Dorsalis pedis pulses palpable b/l.  Posterior tibial pulses faintly palpable b/l.  Digital hair absent b/l.  Skin temperature gradient WNL b/l.  Dermatological Examination: Skin thin, shiny and atrophic b/l  Toenails 1-5 right foot, 2-5 left foot discolored, thick, dystrophic with subungual debris and pain with palpation to nailbeds due to thickness of nails.   Ulceration located submet head 1 right foot: Predebridement measurements carried out today of 2.0 x 2.0 x 0.4 cm.  No periulcerative erythema, no edema, no drainage.  No lymphangitis. No purulence expressed. There is a hyperkeratotic rim. There is tracking medially, laterally and distally. There is a hint of malodor.     Postdebridement measurements today are: 2.0 x 2.5 x 0.4 cm. There is a tunnel of 0.5 cm in diameter and tracks laterally and distally. No erythema, no edema. No probing to bone, no purulent drainage.  No deep abscess evident. No crepitation to indicate gas.   Musculoskeletal: Muscle strength 5/5 to all LE muscle groups.  Hammertoes present to  digits of right foot.  Neurological: Sensation intact with 10 gram monofilament.  RLS active right LE.   Xrays right foot: No evidence of gas in tissues Hardware right 1st metatarsal remains intact No bone erosion noted 1st metatarsal  Assessment: 1. Onychomycosis toenails 1-5 right, 2-5 left 2. Diabetic Ulceration right foot 3. NIDDM   Plan: 1. Toenails 1-5 right, 2-5 left were debrided in length and girth without iatrogenic bleeding. 2. Ulcer was debrided of nonviable necrotic tissue and was resected to the level of healthy viable tissue. Ulcer was cleansed with wound cleanser. Iodosorb Gel was applied to base of wound with light dressing. 3. Culture and sensitivity taken of wound on today.  4. Xray of right foot was performed and reviewed with patient. 5. He has surgical shoe at home and was advised to wear it. changes/aftercare and was instructed to call immediately if any signs or symptoms of infection arise.  6. Patient instructed to report to emergency department with worsening appearance of ulcer/toe/foot, increased pain, foul odor, increased redness, swelling, drainage, fever, chills, nightsweats, nausea, vomiting, increased blood sugar.  7. Patient/POA related understanding. 8. Follow up with me in 9 weeks for his routine foot care. 9. Follow up with Dr. March [name] for his ulceration right foot this Thursday or Friday. 10. Patient/POA to call should there be a concern in the interim.

## 2019-08-31 ENCOUNTER — Other Ambulatory Visit: Payer: Self-pay

## 2019-08-31 LAB — WOUND CULTURE
MICRO NUMBER:: 1175840
SPECIMEN QUALITY:: ADEQUATE

## 2019-09-03 ENCOUNTER — Encounter: Payer: Self-pay | Admitting: Gastroenterology

## 2019-09-03 ENCOUNTER — Ambulatory Visit: Payer: PPO | Admitting: Gastroenterology

## 2019-09-03 ENCOUNTER — Other Ambulatory Visit: Payer: Self-pay

## 2019-09-03 VITALS — BP 133/75 | HR 79 | Temp 97.0°F | Ht 69.0 in | Wt 224.4 lb

## 2019-09-03 DIAGNOSIS — K2100 Gastro-esophageal reflux disease with esophagitis, without bleeding: Secondary | ICD-10-CM

## 2019-09-03 DIAGNOSIS — R1310 Dysphagia, unspecified: Secondary | ICD-10-CM | POA: Diagnosis not present

## 2019-09-03 DIAGNOSIS — D539 Nutritional anemia, unspecified: Secondary | ICD-10-CM | POA: Insufficient documentation

## 2019-09-03 DIAGNOSIS — K5900 Constipation, unspecified: Secondary | ICD-10-CM

## 2019-09-03 DIAGNOSIS — D649 Anemia, unspecified: Secondary | ICD-10-CM | POA: Diagnosis not present

## 2019-09-03 DIAGNOSIS — R195 Other fecal abnormalities: Secondary | ICD-10-CM

## 2019-09-03 DIAGNOSIS — R1319 Other dysphagia: Secondary | ICD-10-CM

## 2019-09-03 MED ORDER — LUBIPROSTONE 8 MCG PO CAPS: 8.0000 ug | ORAL_CAPSULE | Freq: Two times a day (BID) | ORAL | 0 refills | Status: DC

## 2019-09-03 NOTE — Assessment & Plan Note (Signed)
History of solid food esophageal dysphagia with EGD last month showing erosive reflux esophagitis, mild esophageal stricture, Mallory-Weiss tear, esophageal dilation not performed due to friability noted.  Plans for EGD with esophageal dilation with deep sedation in the near future.  Hold Plavix for 7 days.  Patient is aware of these recommendations.  I have discussed the risks, alternatives, benefits with regards to but not limited to the risk of reaction to medication, bleeding, infection, perforation and the patient is agreeable to proceed. Written consent to be obtained.

## 2019-09-03 NOTE — Progress Notes (Addendum)
Primary Care Physician:  [name], MD  Primary Gastroenterologist:  Garfield Cornea, MD   Chief Complaint  Patient presents with  . Colonoscopy    poor prep, vomited last time    HPI:  [name] is a 73 y.o. male here to schedule upper endoscopy and colonoscopy. Colonoscopy scheduled previously due to positive Cologuard.  EGD was scheduled for dysphagia to solid food.  History of Schatzki ring requiring dilation in the past.  Attempted procedures in November.  EGD August 13, 2019: Erosive reflux esophagitis with mild stricture and incidental Mallory-Weiss tear precluded esophageal dilation today. Medium-sized hiatal hernia.  Patient needs to return for EGD with esophageal dilation off Plavix for 7 days.  Colonoscopy attempted the same day but poor prep occluded exam.  Noted to have formed stool in the rectum.  Has chronic constipation. BM twice per week. He had been taking Miralax prn only. Advised to take daily and let us know if not effective.  Patient states he had a lot going on at that time and really did not take MiraLAX on a regular basis.  The day before his procedure he took the first dose of Plenvu without any difficulty.  The morning of procedure he took the second dose of Plenvu and with minutes he developed vomiting.  He describes having projectile vomiting.  Since his colonoscopy 3 weeks ago, he took MiraLAX 4 days in a row because he felt like he needed to start really pain attention to his bowels in order to have a successful prep next time.  After 4 days of taking MiraLAX he did not have a bowel movement.  2 days later he had a "blowout".  Incontinence of stool because he cannot make it to the restroom in time.  He does not like taking MiraLAX because of that.  Since then he has a bowel movement every few days, consist of large volume liquid stool.  No melena or rectal bleeding.  Has not used MiraLAX in 2 weeks.  Continues with solid food dysphagia.  States he never  had heartburn so he surprised he had reflux esophagitis.  He is taking pantoprazole 40 mg daily which was started 3 weeks ago.  Preop labs on November 23 showed hemoglobin of 10.7, hematocrit 32.1.  November 2019 his hemoglobin was 11.8.  Current Outpatient Medications  Medication Sig Dispense Refill  . aspirin EC 81 MG EC tablet Take 1 tablet (81 mg total) by mouth daily. (Patient taking differently: Take 81 mg by mouth at bedtime. )    . b complex vitamins tablet Take 1 tablet by mouth daily.    . carvedilol (COREG) 3.125 MG tablet TAKE ONE TABLET BY MOUTH TWICE A DAY WITH A MEAL (Patient taking differently: Take 3.125 mg by mouth 2 (two) times daily with a meal. ) 180 tablet 3  . Cholecalciferol (VITAMIN D3) 5000 units TABS Take 5,000 Units by mouth 2 (two) times daily.     . clopidogrel (PLAVIX) 75 MG tablet Take 75 mg by mouth daily.     . Continuous Blood Gluc Sensor (FREESTYLE LIBRE 14 DAY SENSOR) MISC     . Cyanocobalamin ([employer] B-12) 1500 MCG TBDP Take 1,500 mcg by mouth daily.     . DULoxetine (CYMBALTA) 60 MG capsule Take 60 mg by mouth daily.    . furosemide (LASIX) 40 MG tablet TAKE ONE TABLET (40MG  TOTAL) BY MOUTH TWO TIMES DAILY. (Patient taking differently: Take 40 mg by mouth 2 (two) times daily. )  180 tablet 3  . Ginger, Zingiber officinalis, (GINGER ROOT) 550 MG CAPS Take 550 mg by mouth daily.    [name] glimepiride (AMARYL) 4 MG tablet Take 4 mg by mouth daily with breakfast.    . levothyroxine (SYNTHROID, LEVOTHROID) 75 MCG tablet Take 75 mcg by mouth daily before breakfast.     . nitroGLYCERIN (NITROSTAT) 0.4 MG SL tablet Place 1 tablet (0.4 mg total) under the tongue every 5 (five) minutes as needed for chest pain. 25 tablet 3  . pantoprazole (PROTONIX) 40 MG tablet Take 40 mg by mouth daily.    . polyethylene glycol (MIRALAX) 17 g packet Take 17 g by mouth daily as needed. (Patient taking differently: Take 17 g by mouth daily as needed for moderate constipation. ) 14 each 0  .  potassium chloride SA (K-DUR,KLOR-CON) 20 MEQ tablet Take 1 tablet (20 mEq total) by mouth daily. Please schedule appointment for refills (Patient taking differently: Take 20 mEq by mouth every other day. Please schedule appointment for refills) 90 tablet 3  . pramipexole (MIRAPEX) 0.25 MG tablet Take 2 tablets (0.5 mg total) by mouth 2 (two) times daily. Take 0.5mg  by mouth at 7 PM and 0.5mg  at 9 PM daily. (Patient taking differently: Take 0.5 mg by mouth 2 (two) times daily. Take 0.5mg  by mouth at 5 PM and 0.5mg  at 7 PM daily.) 360 tablet 3  . rosuvastatin (CRESTOR) 20 MG tablet Take 20 mg by mouth daily.     No current facility-administered medications for this visit.    Allergies as of 09/03/2019 - Review Complete 09/03/2019  Allergen Reaction Noted  . Quinoline yellow ss [yellow dye #11]  10/09/2018  . Codeine Nausea And Vomiting   . Tramadol Nausea Only 10/23/2012    Past Medical History:  Diagnosis Date  . Arthritis   . Cardiomyopathy- EF NL 01/2013, now 30-35% echo 03/14/2012  . Carotid artery occlusion    left s/p CEA  . CHF (congestive heart failure) ([place])   . Diabetes mellitus   . GERD (gastroesophageal reflux disease)   . HOH (hard of hearing)   . Hypertension   . Hypothyroidism   . Obstructive sleep apnea   . Orthostatic hypotension   . Osteomyelitis ([place])    left great toe  . Peripheral arterial disease ([place]), s/p PTA x 2 RLE    nonhealing ulcers bilaterally on each great toe  . Pneumonia   . PONV (postoperative nausea and vomiting)   . Restless leg syndrome   . Shortness of breath   . Stroke [facility]) March 08, 2012  . Wears dentures     Past Surgical History:  Procedure Laterality Date  . AMPUTATION TOE Left 07/25/2018   Procedure: AMPUTATION TOE INTERPHALANGEAL HALLUX LEFT;  Surgeon: Evelina Bucy, DPM;  Location: [place];  Service: Podiatry;  Laterality: Left;  . ANGIOPLASTY  02/28/14   diamond back orbital rotational atherectomy of Rt. tibial  . BACK SURGERY     . BONE BIOPSY Left 07/25/2018   Procedure: SUPERFICIAL BONE BIOPSY;  Surgeon: Evelina Bucy, DPM;  Location: [place];  Service: Podiatry;  Laterality: Left;  . CARDIAC CATHETERIZATION N/A 05/19/2016   Procedure: Right/Left Heart Cath and Coronary Angiography;  Surgeon: Belva Crome, MD;  Location: [place] [facility];  Service: Cardiovascular;  Laterality: N/A;  . CERVICAL FUSION    . ENDARTERECTOMY Left 11/29/2013   Procedure: ENDARTERECTOMY CAROTID;  Surgeon: Serafina Mitchell, MD;  Location: [place];  Service: Vascular;  Laterality: Left;  . ESOPHAGOGASTRODUODENOSCOPY  02/2010   Dr. Gala Romney: patient presented with food impaction, schatzki ring with superimposed component of stricture with erosive reflux esophagitis, s/p disimpaction but dilation planned at later date   . ESOPHAGOGASTRODUODENOSCOPY (EGD) WITH PROPOFOL N/A 08/13/2019   Dr. Gala Romney: Erosive reflux esophagitis with mild stricture and incidental Mallory-Weiss tear which precluded esophageal dilation.  Medium sized hiatal hernia.  [name] EYE SURGERY    . FLEXIBLE SIGMOIDOSCOPY N/A 08/13/2019   Procedure: FLEXIBLE SIGMOIDOSCOPY;  Surgeon: [name], MD;  [facility];  Service: Endoscopy;  Laterality: N/A;  colonoscopy aboerted due to formed stool and poor prep  . FOOT SURGERY    . Lower ext duplex doppler  03/14/14   Rt ABI 1.2  . LOWER EXTREMITY ANGIOGRAM Bilateral 02/18/2014   Procedure: LOWER EXTREMITY ANGIOGRAM;  Surgeon: [name], MD;  Location: [facility] CATH LAB;  Service: Cardiovascular;  Laterality: Bilateral;  . LOWER EXTREMITY ANGIOGRAM N/A 10/31/2014   Procedure: LOWER EXTREMITY ANGIOGRAM;  Surgeon: [name], MD;  Location: [facility] CATH LAB;  Service: Cardiovascular;  Laterality: N/A;  . MULTIPLE TOOTH EXTRACTIONS    . PV angiogram  02/18/2014   tibial vessel diseas bil.  [name] SPINE SURGERY    . tendon achillies lengthing and sesamoid      Family History  Problem Relation Age of Onset  . Heart disease Mother    . Hypertension Mother   . Heart attack Mother   . Hypertension Father   . Diabetes Father   . Diabetes Son   . Heart disease Son   . Hypertension Son   . Colon cancer Neg Hx     Social History   Socioeconomic History  . Marital status: Married    Spouse name: Not on file  . Number of children: Not on file  . Years of education: college  . Highest education level: Not on file  Occupational History  . Occupation: Firefighter   Tobacco Use  . Smoking status: Former Smoker    Years: 1.00    Types: Pipe    Quit date: 07/08/1977    Years since quitting: 42.1  . Smokeless tobacco: Never Used  Substance and Sexual Activity  . Alcohol use: No    Alcohol/week: 0.0 standard drinks  . Drug use: No  . Sexual activity: Not on file  Other Topics Concern  . Not on file  Social History Narrative   Drinks 3 71oz cokes a day    Social Determinants of Health   Financial Resource Strain:   . Difficulty of Paying Living Expenses: Not on file  Food Insecurity:   . Worried About Charity fundraiser in the Last Year: Not on file  . Ran Out of Food in the Last Year: Not on file  Transportation Needs:   . Lack of Transportation (Medical): Not on file  . Lack of Transportation (Non-Medical): Not on file  Physical Activity:   . Days of Exercise per Week: Not on file  . Minutes of Exercise per Session: Not on file  Stress:   . Feeling of Stress : Not on file  Social Connections:   . Frequency of Communication with Friends and Family: Not on file  . Frequency of Social Gatherings with Friends and Family: Not on file  . Attends Religious Services: Not on file  . Active Member of Clubs or Organizations: Not on file  . Attends Archivist Meetings: Not on file  .  Marital Status: Not on file  Intimate Partner Violence:   . Fear of Current or Ex-Partner: Not on file  . Emotionally Abused: Not on file  . Physically Abused: Not on file  . Sexually Abused: Not on file       ROS:  General: Negative for anorexia, weight loss, fever, chills, fatigue, positive weakness. Eyes: Negative for vision changes.  ENT: Negative for hoarseness, difficulty swallowing , nasal congestion. CV: Negative for chest pain, angina, palpitations, dyspnea on exertion, positive peripheral edema.  Respiratory: Negative for dyspnea at rest, dyspnea on exertion, cough, sputum, wheezing.  GI: See history of present illness. GU:  Negative for dysuria, hematuria, urinary incontinence, urinary frequency, nocturnal urination.  MS: Negative for joint pain, low back pain.  Derm: Negative for rash or itching.  Neuro: Negative for weakness, abnormal sensation, seizure, frequent headaches, memory loss, confusion.  Psych: Negative for anxiety, depression, suicidal ideation, hallucinations.  Endo: Negative for unusual weight change.  Heme: Negative for bruising or bleeding. Allergy: Negative for rash or hives.    Physical Examination:  BP 133/75   Pulse 79   Temp (!) 97 F (36.1 C) (Temporal)   Ht 5\' 9"  (1.753 m)   Wt 224 lb 6.4 oz (101.8 kg)   BMI 33.14 kg/m    General: Well-nourished, well-developed in no acute distress.  Abnormal gait.  Ambulates with cane. Head: Normocephalic, atraumatic.   Eyes: Conjunctiva pink, no icterus. Mouth: Masked Neck: Supple without thyromegaly, masses, or lymphadenopathy.  Lungs: Clear to auscultation bilaterally.  Heart: Regular rate and rhythm, no murmurs rubs or gallops.  Abdomen: Bowel sounds are normal, nontender, nondistended, no hepatosplenomegaly or masses, no abdominal bruits or    hernia , no rebound or guarding.   Rectal: Performed Extremities: 1+ lower extremity edema. No clubbing or deformities of upper extremity.  Neuro: Alert and oriented x 4 , grossly normal neurologically.  Skin: Warm and dry, no rash or jaundice.   Psych: Alert and cooperative, normal mood and affect.  Labs: Lab Results  Component Value Date   CREATININE 0.86  08/09/2019   BUN 11 08/09/2019   NA 135 08/09/2019   K 3.5 08/09/2019   CL 98 08/09/2019   CO2 27 08/09/2019    Lab Results  Component Value Date         HGB 10.7 (L) 08/13/2019   HCT 32.1 (L) 08/13/2019                 Imaging Studies: DG Foot Complete Right  Result Date: 08/28/2019 Please see detailed radiograph report in office note.  VAS [place] ABI WITH/WO TBI  Result Date: 08/22/2019 LOWER EXTREMITY DOPPLER STUDY Indications: Peripheral artery disease. Right posterior tibial artery              angioplasty follow-up. Patient had a partial left great toe              amputation in 11/19 for osteomyelitis. He has a diabetic ulcer on              the bottom of his right foot that started in summer of 2020 and is              being treated by Podiatry has been slowly healing. Patient denies              claudication symptoms and says his legs otherwise feel good. High Risk         Hypertension, hyperlipidemia, Diabetes, past history of  Factors:          smoking.  Vascular Interventions: S/P right posterior tibial artery angioplasty in 2/17. Comparison Study: Most recent ABI on 07/04/18 was 1.11 on the right and 1.0 on                   the left. Performing Technologist: Mariane Masters RVT  Examination Guidelines: A complete evaluation includes at minimum, Doppler waveform signals and systolic blood pressure reading at the level of bilateral brachial, anterior tibial, and posterior tibial arteries, when vessel segments are accessible. Bilateral testing is considered an integral part of a complete examination. Photoelectric Plethysmograph (PPG) waveforms and toe systolic pressure readings are included as required and additional duplex testing as needed. Limited examinations for reoccurring indications may be performed as noted.  ABI Findings: +---------+------------------+-----+-----------+--------+ Right    Rt Pressure (mmHg)IndexWaveform   Comment   +---------+------------------+-----+-----------+--------+ Brachial 130                                        +---------+------------------+-----+-----------+--------+ ATA      171               1.22 triphasic           +---------+------------------+-----+-----------+--------+ PTA      170               1.21 triphasic           +---------+------------------+-----+-----------+--------+ PERO     148               1.06 multiphasic         +---------+------------------+-----+-----------+--------+ Great Toe74                0.53 Normal              +---------+------------------+-----+-----------+--------+ +---------+------------------+-----+---------+---------------------------------+ Left     Lt Pressure (mmHg)IndexWaveform Comment                           +---------+------------------+-----+---------+---------------------------------+ Brachial 140                                                               +---------+------------------+-----+---------+---------------------------------+ ATA      173               1.24 triphasic                                  +---------+------------------+-----+---------+---------------------------------+ PTA      175               1.25 triphasic                                  +---------+------------------+-----+---------+---------------------------------+ PERO     182               1.30 triphasic                                  +---------+------------------+-----+---------+---------------------------------+ [place]  Toe92                0.66 Abnormal pressure taken on 2nd digit due                                            to great toe parial amputation    +---------+------------------+-----+---------+---------------------------------+ +-------+-----------+-----------+------------+------------+ ABI/TBIToday's ABIToday's TBIPrevious ABIPrevious TBI  +-------+-----------+-----------+------------+------------+ Right  1.22       0.53       1.11        0.78         +-------+-----------+-----------+------------+------------+ Left   1.30       0.66       1.0         0.83         +-------+-----------+-----------+------------+------------+ Bilateral ABIs appear essentially unchanged compared to prior study on 07/04/18. Bilateral TBIs appear decreased compared to prior study on 07/04/18.  Summary: Right: Resting right ankle-brachial index is within normal range. No evidence of significant right lower extremity arterial disease. The right toe-brachial index is abnormal. Left: Resting left ankle-brachial index is within normal range. No evidence of significant left lower extremity arterial disease. The left toe-brachial index is abnormal.  *See table(s) above for measurements and observations. See arterial duplex report.  Suggest follow up study in 12 months. Electronically signed by Ida Rogue MD on 08/22/2019 at 4:04:39 PM.    Final    VAS [place] LOWER EXTREMITY ARTERIAL DUPLEX  Result Date: 08/22/2019 LOWER EXTREMITY ARTERIAL DUPLEX STUDY Indications: Peripheral artery disease. Right posterior tibial artery              angioplasty follow-up. Patient had a partial left great toe              amputation in 11/19 for osteomyelitis. He has a diabetic ulcer on              the bottom of his right foot that started in summer 2020 and is              being treated by Podiatry, it is slowly healing and patient says it              is improved. Patient denies claudication symptoms and says his legs              otherwise feel good. High Risk Factors: Hypertension, hyperlipidemia, Diabetes, past history of                    smoking.  Vascular Interventions: S/P right posterior tibial artery angioplasty in 2/17. Current ABI:            Today's ABI's are 1.2 on the right and 1.3 on the left. Comparison Study: In 7/19, an arterial duplex showed no significant  stenosis in                   the right lower extremity with minimal plaque and widely                   patent posterior tibial angioplasty site. Performing Technologist: Mariane Masters RVT  Examination Guidelines: A complete evaluation includes B-mode imaging, spectral Doppler, color Doppler, and power Doppler as needed of all accessible portions of each vessel. Bilateral testing is considered an integral part of a complete examination. Limited examinations for reoccurring indications may be performed as  noted.  +----------+--------+-----+---------------+---------+--------+ RIGHT     PSV cm/sRatioStenosis       Waveform Comments +----------+--------+-----+---------------+---------+--------+ CFA Prox  130                         triphasic         +----------+--------+-----+---------------+---------+--------+ DFA       128                         triphasic         +----------+--------+-----+---------------+---------+--------+ SFA Prox  93                          triphasic         +----------+--------+-----+---------------+---------+--------+ SFA Mid   107                         triphasic         +----------+--------+-----+---------------+---------+--------+ SFA Distal115                         triphasic         +----------+--------+-----+---------------+---------+--------+ POP Prox  89                          triphasic         +----------+--------+-----+---------------+---------+--------+ POP Distal97                          triphasicplaque   +----------+--------+-----+---------------+---------+--------+ TP Trunk  126                         triphasic         +----------+--------+-----+---------------+---------+--------+ PTA Prox  88                          triphasic         +----------+--------+-----+---------------+---------+--------+ PTA Mid   132                         hyperemic          +----------+--------+-----+---------------+---------+--------+ PTA Distal165     1.4  30-49% stenosishyperemic         +----------+--------+-----+---------------+---------+--------+ A focal velocity elevation of 165 cm/s was obtained at distal posterior tibial artery with a VR of 1.4. Findings are characteristic of 30-49% stenosis.  Summary: Right: Minimal heterogeneous plaque in the right lower extremity. No evidence of stenosis in the common femoral, superficial femoral or popliteal arteries. 30-49% stenosis noted in the posterior tibial artery s/p angioplasty. Mild progression is noted compared to previous study.  See table(s) above for measurements and observations. See ABI report. Suggest follow up study in 12 months. Electronically signed by Ida Rogue MD on 08/22/2019 at 3:48:08 PM.    Final

## 2019-09-03 NOTE — Assessment & Plan Note (Signed)
Continue pantoprazole 40 mg daily.  Patient denies typical heartburn at time of EGD. May have silent reflux.  Given history of complicated GERD, advised ongoing pantoprazole 40 mg daily.

## 2019-09-03 NOTE — Assessment & Plan Note (Addendum)
History of positive Cologuard testing, mild stable anemia.  Attempted colonoscopy several weeks back precluded by poor prep.  Patient was unable to keep down second dose of Plenvu due to vomiting.  Also with chronic constipation and he tells me that he did not take MiraLAX on a regular basis as recommended.  At this time he is having a bowel movement every few days but only passes liquid stool when he goes.  Question spurious diarrhea.  Recommend stop MiraLAX.  Try Amitiza 8 mcg 1-2 times daily for constipation.  Samples provided.  If he finds this is not effective he can go up to 24 mcg 1-2 times daily, samples provided.  He will call let us know how he is doing and what dose seems to be working best for him and we will call in a prescription.  Patient is fully aware that we need to address his constipation effectively prior to colonoscopy coming up in a few weeks.  Colonoscopy with deep sedation in the near future.  I have discussed the risks, alternatives, benefits with regards to but not limited to the risk of reaction to medication, bleeding, infection, perforation and the patient is agreeable to proceed. Written consent to be obtained.  Per Dr. Gala Romney, we will have patient hold Plavix for 7 days prior to procedure. He may continue aspirin.  Avoid Plenvu due to vomiting.

## 2019-09-03 NOTE — Patient Instructions (Addendum)
1. Stop Miralax.  2. Start Amitiza 8 mcg 1-2 times daily with food for constipation.  Samples provided.  If not effective, increase to 24 mcg 1-2 times daily with food.  Samples provided.  Call when you determine which strength is best for you and we will send in a prescription.  Also please call if you are not having effective bowel movement so that we can work on this prior to your colonoscopy. 3. Colonoscopy and upper endoscopy as scheduled.  Please see separate instructions.  Instructions will include the stop date for Plavix.

## 2019-09-04 ENCOUNTER — Telehealth: Payer: Self-pay | Admitting: *Deleted

## 2019-09-04 ENCOUNTER — Encounter: Payer: Self-pay | Admitting: *Deleted

## 2019-09-04 ENCOUNTER — Other Ambulatory Visit: Payer: Self-pay | Admitting: *Deleted

## 2019-09-04 DIAGNOSIS — R1319 Other dysphagia: Secondary | ICD-10-CM

## 2019-09-04 DIAGNOSIS — D649 Anemia, unspecified: Secondary | ICD-10-CM

## 2019-09-04 DIAGNOSIS — R1310 Dysphagia, unspecified: Secondary | ICD-10-CM

## 2019-09-04 DIAGNOSIS — R195 Other fecal abnormalities: Secondary | ICD-10-CM

## 2019-09-04 DIAGNOSIS — K2100 Gastro-esophageal reflux disease with esophagitis, without bleeding: Secondary | ICD-10-CM

## 2019-09-04 MED ORDER — NA SULFATE-K SULFATE-MG SULF 17.5-3.13-1.6 GM/177ML PO SOLN: 1.0000 | Freq: Once | ORAL | 0 refills | Status: AC

## 2019-09-04 NOTE — Telephone Encounter (Signed)
Called spoke with patient. Procedure scheduled for 1/21 at 10:30am (ok date/time per melanie and CM). Patient aware will have to hold plavix 7 days prior. He was given plenvu last time and did not tolerate this. Will send in suprep. Aware to call if too expensive. Instructions with pre-op/covid will mailed. Confirmed mailing address.

## 2019-09-04 NOTE — Telephone Encounter (Signed)
lmovm for pt.

## 2019-09-04 NOTE — Telephone Encounter (Signed)
Patient called in. His copay for suprep is $90. He states he can't pay that. [place] and she states the only alternative to use as trylite is still on back order would be a miralax prep if possible. Please advise LSL thanks

## 2019-09-04 NOTE — Progress Notes (Signed)
Cc'ed to pcp °

## 2019-09-04 NOTE — Telephone Encounter (Signed)
Let's try and get him a Suprep sample between now and next month. He is a failed prep so Miralax prep is NOT a good option.

## 2019-09-05 NOTE — Telephone Encounter (Signed)
Made LSL aware no samples available currently. Will be on the look out if we receive any prior to procedure for patient.

## 2019-09-07 ENCOUNTER — Ambulatory Visit: Payer: PPO | Admitting: Podiatry

## 2019-09-07 ENCOUNTER — Other Ambulatory Visit: Payer: Self-pay

## 2019-09-07 DIAGNOSIS — E08621 Diabetes mellitus due to underlying condition with foot ulcer: Secondary | ICD-10-CM

## 2019-09-07 DIAGNOSIS — M21961 Unspecified acquired deformity of right lower leg: Secondary | ICD-10-CM | POA: Diagnosis not present

## 2019-09-07 DIAGNOSIS — E669 Obesity, unspecified: Secondary | ICD-10-CM

## 2019-09-07 DIAGNOSIS — L97415 Non-pressure chronic ulcer of right heel and midfoot with muscle involvement without evidence of necrosis: Secondary | ICD-10-CM | POA: Diagnosis not present

## 2019-09-07 DIAGNOSIS — E782 Mixed hyperlipidemia: Secondary | ICD-10-CM | POA: Diagnosis not present

## 2019-09-07 DIAGNOSIS — E1159 Type 2 diabetes mellitus with other circulatory complications: Secondary | ICD-10-CM | POA: Diagnosis not present

## 2019-09-07 DIAGNOSIS — M2021 Hallux rigidus, right foot: Secondary | ICD-10-CM

## 2019-09-07 NOTE — Patient Instructions (Signed)
Pre-Operative Instructions  Congratulations, you have decided to take an important step towards improving your quality of life.  You can be assured that the doctors and staff at [facility] will be with you every step of the way.  Here are some important things you should know:  1. Plan to be at the surgery center/hospital at least 1 (one) hour prior to your scheduled time, unless otherwise directed by the surgical center/hospital staff.  You must have a responsible adult accompany you, remain during the surgery and drive you home.  Make sure you have directions to the surgical center/hospital to ensure you arrive on time. 2. If you are having surgery at [facility] or [facility] hospitals, you will need a copy of your medical history and physical form from your family physician within one month prior to the date of surgery. We will give you a form for your primary physician to complete.  3. We make every effort to accommodate the date you request for surgery.  However, there are times where surgery dates or times have to be moved.  We will contact you as soon as possible if a change in schedule is required.   4. No aspirin/ibuprofen for one week before surgery.  If you are on aspirin, any non-steroidal anti-inflammatory medications (Mobic, Aleve, Ibuprofen) should not be taken seven (7) days prior to your surgery.  You make take Tylenol for pain prior to surgery.  5. Medications - If you are taking daily heart and blood pressure medications, seizure, reflux, allergy, asthma, anxiety, pain or diabetes medications, make sure you notify the surgery center/hospital before the day of surgery so they can tell you which medications you should take or avoid the day of surgery. 6. No food or drink after midnight the night before surgery unless directed otherwise by surgical center/hospital staff. 7. No alcoholic beverages 24-hours prior to surgery.  No smoking 24-hours prior or 24-hours after  surgery. 8. Wear loose pants or shorts. They should be loose enough to fit over bandages, boots, and casts. 9. Don't wear slip-on shoes. Sneakers are preferred. 10. Bring your boot with you to the surgery center/hospital.  Also bring crutches or a walker if your physician has prescribed it for you.  If you do not have this equipment, it will be provided for you after surgery. 11. If you have not been contacted by the surgery center/hospital by the day before your surgery, call to confirm the date and time of your surgery. 12. Leave-time from work may vary depending on the type of surgery you have.  Appropriate arrangements should be made prior to surgery with your employer. 13. Prescriptions will be provided immediately following surgery by your doctor.  Fill these as soon as possible after surgery and take the medication as directed. Pain medications will not be refilled on weekends and must be approved by the doctor. 14. Remove nail polish on the operative foot and avoid getting pedicures prior to surgery. 15. Wash the night before surgery.  The night before surgery wash the foot and leg well with water and the antibacterial soap provided. Be sure to pay special attention to beneath the toenails and in between the toes.  Wash for at least three (3) minutes. Rinse thoroughly with water and dry well with a towel.  Perform this wash unless told not to do so by your physician.  Enclosed: 1 Ice pack (please put in freezer the night before surgery)   1 Hibiclens skin cleaner     Pre-op instructions  If you have any questions regarding the instructions, please do not hesitate to call our office.  [place]: [address], , [place] [address] -- [phone]  [place]: [address] [place],  [address] -- [phone]  [place]: [address], ,  [address] -- [phone]   Website: [email or web address] 

## 2019-09-19 ENCOUNTER — Other Ambulatory Visit: Payer: Self-pay

## 2019-09-19 ENCOUNTER — Ambulatory Visit (INDEPENDENT_AMBULATORY_CARE_PROVIDER_SITE_OTHER): Payer: PPO | Admitting: Cardiovascular Disease

## 2019-09-19 ENCOUNTER — Encounter: Payer: Self-pay | Admitting: Cardiovascular Disease

## 2019-09-19 VITALS — BP 124/52 | HR 77 | Temp 97.0°F | Ht 69.0 in | Wt 225.0 lb

## 2019-09-19 DIAGNOSIS — I739 Peripheral vascular disease, unspecified: Secondary | ICD-10-CM

## 2019-09-19 DIAGNOSIS — I10 Essential (primary) hypertension: Secondary | ICD-10-CM | POA: Diagnosis not present

## 2019-09-19 DIAGNOSIS — I6523 Occlusion and stenosis of bilateral carotid arteries: Secondary | ICD-10-CM

## 2019-09-19 DIAGNOSIS — E782 Mixed hyperlipidemia: Secondary | ICD-10-CM | POA: Diagnosis not present

## 2019-09-19 DIAGNOSIS — G4733 Obstructive sleep apnea (adult) (pediatric): Secondary | ICD-10-CM | POA: Diagnosis not present

## 2019-09-19 DIAGNOSIS — I429 Cardiomyopathy, unspecified: Secondary | ICD-10-CM | POA: Diagnosis not present

## 2019-09-19 NOTE — Assessment & Plan Note (Signed)
History of carotid artery disease status post elective left carotid endarterectomy performed by Dr. [name] in the past.  His last carotid Dopplers performed 07/19/2016 revealed mild widely patent ICA internal carotid arteries.

## 2019-09-19 NOTE — Patient Instructions (Addendum)
Medication Instructions:  Your physician recommends that you continue on your current medications as directed. Please refer to the Current Medication list given to you today.  If you need a refill on your cardiac medications before your next appointment, please call your pharmacy.   Lab work: NONE  Testing/Procedures:   Your physician has requested that you have a carotid duplex. This test is an ultrasound of the carotid arteries in your neck. It looks at blood flow through these arteries that supply the brain with blood. Allow one hour for this exam. There are no restrictions or special instructions.  IN ONE YEAR   Your physician has requested that you have a lower extremity arterial exercise duplex. During this test, exercise and ultrasound are used to evaluate arterial blood flow in the legs. Allow one hour for this exam. There are no restrictions or special instructions.  AND  Your physician has requested that you have an ankle brachial index (ABI). During this test an ultrasound and blood pressure cuff are used to evaluate the arteries that supply the arms and legs with blood. Allow thirty minutes for this exam. There are no restrictions or special instructions.   Follow-Up: At [facility] you and your health needs are our priority.  As part of our continuing mission to provide you with exceptional heart care, we have created designated Provider Care Teams.  These Care Teams include your primary Cardiologist (physician) and Advanced Practice Providers (APPs -  Physician Assistants and Nurse Practitioners) who all work together to provide you with the care you need, when you need it. You may see Quay Burow, MD or one of the following Advanced Practice Providers on your designated Care Team:    Kerin Ransom, PA-C  [place], [place]  Coletta Memos, [place]   Your physician wants you to follow-up in: 1 YEAR

## 2019-09-19 NOTE — Assessment & Plan Note (Signed)
History of hyperlipidemia on statin therapy with lipid profile performed 03/29/2019 revealing total cholesterol 119, LDL 54 HDL 46

## 2019-09-19 NOTE — Progress Notes (Signed)
09/19/2019 [name]   29-Oct-1945  [id number]  Primary Physician [name], MD Primary Cardiologist: [name] MD Lupe Carney, [place]  HPI:  [name] is a 73 y.o.  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 He now works at [employer]. He was referred by Dr. [name] from [facility] for peripheral vasodilation because of nonhealing small ulcers on the dorsal surface of his great toes bilaterally. I last saw him in the office  08/01/2018. His cardiovascular risk factor profile is remarkable for treated hypertension, diabetes and hyperlipidemia. He has had a stroke 03/08/12 and has carotid disease followed by Dr. [name] ,. He denies chest pain, shortness of breath or claudication. He has had 2 small ulcers on the dorsal surface of both great toes for 6-9 months which have been slow to heal. Since I saw him last in November he has had an elective left carotid endarterectomy performed by Dr. [name] . He was also placed on CPAP because of obstructive sleep apnea which he is benefiting from. He is compliant with his CPAP.Marland KitchenHe has developed a nonhealing wound on his right great toe which had been fairly rapidly progressing and he sent back for further evaluation. I performed lower extremity Doppler studies last October suggesting tibial vessel disease..I performed angiography on him 03/19/14 demonstrating severe tibial disease and ultimately performed diamondback orbital rotational atherectomy of his right posterior tibial establishing excellent in-line flow. This resulted in ultimate healing of his right great toe ischemic ulcer, improvement in his symptoms and Dopplers as well. He has since developed an ischemic ulcer on the plantar surface of the medial aspect of his right foot as a result of trauma and is being treated by his podiatrist, Dr. Barkley Bruns.follow-up  Dopplers post intervention revealed an increase in his right ABI to 1.2 however, subsequent Dopplers performed 07/02/14 revealed a decrease in his right eye twice a day 0.75 with occlusion of his posterior tibial artery. Because of progression of the ulcer on the dorsal surface of his right heel 3 angina gram him 10/31/14 revealing a subtotally occluded right posterior tibial artery which I re-intervened on with an excellent clinical management result. His subsequent follow-up Dopplers performed 11/08/14 revealed an increase in his right ABI from 0.75 Up to 1.2. His ulcer is slowly healing. Since I saw him a year ago he's remained stable. His most recent lower extremity arterial Doppler studies performed 02/24/16 revealed normal ABIs bilaterally. Since I saw him 6 months ago he was admitted with congestive heart failure and 05/16/16 and diuresis. His EF was 30-35% by 2-D echo. He underwent right and left heart cath by Dr. Tamala Julian revealing an elevated LVEDP and moderate diffuse CAD. Medical therapy was recommended.Since I saw him in the office 6 months ago he's remained fairly stable. He is aware some restriction. He is medically compliant. He denies chest pain or shortness of breath. He has no new wounds on his feet.    He had  osteomyelitis on his left great toe and had successful amputation of this which healed.   His most recent lower extremity arterial Doppler studies performed 08/21/2019 revealed normal ABIs bilaterally with patent tibial vessels.  Since I saw him a year ago he is remained stable.  He denies chest pain or shortness of breath.   Current Meds  Medication Sig  . aspirin EC 81 MG EC tablet Take 1 tablet (  81 mg total) by mouth daily. (Patient taking differently: Take 81 mg by mouth at bedtime. )  . b complex vitamins tablet Take 1 tablet by mouth daily.  . carvedilol (COREG) 3.125 MG tablet TAKE ONE TABLET BY MOUTH TWICE A DAY WITH A MEAL (Patient taking differently: Take 3.125 mg by mouth 2  (two) times daily with a meal. )  . Cholecalciferol (VITAMIN D3) 5000 units TABS Take 5,000 Units by mouth 2 (two) times daily.   . clopidogrel (PLAVIX) 75 MG tablet Take 75 mg by mouth daily.   . Continuous Blood Gluc Sensor (FREESTYLE LIBRE 14 DAY SENSOR) MISC   . Cyanocobalamin ([employer] B-12) 1500 MCG TBDP Take 1,500 mcg by mouth daily.   . DULoxetine (CYMBALTA) 60 MG capsule Take 60 mg by mouth daily.  . furosemide (LASIX) 40 MG tablet TAKE ONE TABLET (40MG  TOTAL) BY MOUTH TWO TIMES DAILY. (Patient taking differently: Take 40 mg by mouth 2 (two) times daily. )  . Ginger, Zingiber officinalis, (GINGER ROOT) 550 MG CAPS Take 550 mg by mouth daily.  [name] glimepiride (AMARYL) 4 MG tablet Take 4 mg by mouth daily with breakfast.  . levothyroxine (SYNTHROID, LEVOTHROID) 75 MCG tablet Take 75 mcg by mouth daily before breakfast.   . lubiprostone (AMITIZA) 8 MCG capsule Take 1 capsule (8 mcg total) by mouth 2 (two) times daily with a meal.  . nitroGLYCERIN (NITROSTAT) 0.4 MG SL tablet Place 1 tablet (0.4 mg total) under the tongue every 5 (five) minutes as needed for chest pain.  . pantoprazole (PROTONIX) 40 MG tablet Take 40 mg by mouth daily.  . potassium chloride SA (K-DUR,KLOR-CON) 20 MEQ tablet Take 1 tablet (20 mEq total) by mouth daily. Please schedule appointment for refills (Patient taking differently: Take 20 mEq by mouth every other day. Please schedule appointment for refills)  . pramipexole (MIRAPEX) 0.25 MG tablet Take 2 tablets (0.5 mg total) by mouth 2 (two) times daily. Take 0.5mg  by mouth at 7 PM and 0.5mg  at 9 PM daily. (Patient taking differently: Take 0.5 mg by mouth 2 (two) times daily. Take 0.5mg  by mouth at 5 PM and 0.5mg  at 7 PM daily.)  . rosuvastatin (CRESTOR) 20 MG tablet Take 20 mg by mouth daily.  [name] thiamine 100 MG tablet Take by mouth.     Allergies  Allergen Reactions  . Quinoline Yellow Ss [Yellow Dye #11]     Pt unsure   . Codeine Nausea And Vomiting  . Tramadol Nausea  Only    Social History   Socioeconomic History  . Marital status: Married    Spouse name: Not on file  . Number of children: Not on file  . Years of education: college  . Highest education level: Not on file  Occupational History  . Occupation: Firefighter   Tobacco Use  . Smoking status: Former Smoker    Years: 1.00    Types: Pipe    Quit date: 07/08/1977    Years since quitting: 42.2  . Smokeless tobacco: Never Used  Substance and Sexual Activity  . Alcohol use: No    Alcohol/week: 0.0 standard drinks  . Drug use: No  . Sexual activity: Not on file  Other Topics Concern  . Not on file  Social History Narrative   Drinks 3 22oz cokes a day    Social Determinants of Health   Financial Resource Strain:   . Difficulty of Paying Living Expenses: Not on file  Food Insecurity:   . Worried  About Running Out of Food in the Last Year: Not on file  . Ran Out of Food in the Last Year: Not on file  Transportation Needs:   . Lack of Transportation (Medical): Not on file  . Lack of Transportation (Non-Medical): Not on file  Physical Activity:   . Days of Exercise per Week: Not on file  . Minutes of Exercise per Session: Not on file  Stress:   . Feeling of Stress : Not on file  Social Connections:   . Frequency of Communication with Friends and Family: Not on file  . Frequency of Social Gatherings with Friends and Family: Not on file  . Attends Religious Services: Not on file  . Active Member of Clubs or Organizations: Not on file  . Attends Archivist Meetings: Not on file  . Marital Status: Not on file  Intimate Partner Violence:   . Fear of Current or Ex-Partner: Not on file  . Emotionally Abused: Not on file  . Physically Abused: Not on file  . Sexually Abused: Not on file     Review of Systems: General: negative for chills, fever, night sweats or weight changes.  Cardiovascular: negative for chest pain, dyspnea on exertion, edema, orthopnea,  palpitations, paroxysmal nocturnal dyspnea or shortness of breath Dermatological: negative for rash Respiratory: negative for cough or wheezing Urologic: negative for hematuria Abdominal: negative for nausea, vomiting, diarrhea, bright red blood per rectum, melena, or hematemesis Neurologic: negative for visual changes, syncope, or dizziness All other systems reviewed and are otherwise negative except as noted above.    Blood pressure (!) 124/52, pulse 77, temperature (!) 97 F (36.1 C), height 5\' 9"  (1.753 m), weight 225 lb (102.1 kg).  General appearance: alert and no distress Neck: no adenopathy, no carotid bruit, no JVD, supple, symmetrical, trachea midline and thyroid not enlarged, symmetric, no tenderness/mass/nodules Lungs: clear to auscultation bilaterally Heart: regular rate and rhythm, S1, S2 normal, no murmur, click, rub or gallop Extremities: extremities normal, atraumatic, no cyanosis or edema Pulses: 2+ and symmetric Skin: Skin color, texture, turgor normal. No rashes or lesions Neurologic: Alert and oriented X 3, normal strength and tone. Normal symmetric reflexes. Normal coordination and gait  EKG sinus rhythm at 77 with nonspecific ST and T wave changes.  Personally reviewed this EKG.  ASSESSMENT AND PLAN:   Hypertension History of essential hypertension blood pressure measured at 124/52.  He is on carvedilol  Cardiomyopathy- EF NL 01/2013, now 30-35% echo History of nonischemic cardiomyopathy with an EF in the 35% range by 2D echo in 2017 on low-dose carvedilol.  He has no symptoms of heart failure.  Hyperlipidemia History of hyperlipidemia on statin therapy with lipid profile performed 03/29/2019 revealing total cholesterol 119, LDL 54 HDL 46  Peripheral arterial disease ([place]) History of peripheral arterial disease status post diamondback orbital rotational arthrectomy by myself 03/19/2014 of severely diseased posterior tibial artery establishing excellent inline  flow with ultimate healing of his right great toe ulcer.  I reintervened on him 10/31/2014 with marked improvement in his ABIs.  He denies claudication.  Obstructive sleep apnea History of obstructive sleep apnea currently not wearing CPAP  Bilateral carotid artery disease (HCC) History of carotid artery disease status post elective left carotid endarterectomy performed by Dr. [name] in the past.  His last carotid Dopplers performed 07/19/2016 revealed mild widely patent ICA internal carotid arteries.      [name] MD FACP,FACC,FAHA, [facility] 09/19/2019 11:15 AM

## 2019-09-19 NOTE — Assessment & Plan Note (Signed)
History of nonischemic cardiomyopathy with an EF in the 35% range by 2D echo in 2017 on low-dose carvedilol.  He has no symptoms of heart failure.

## 2019-09-19 NOTE — Assessment & Plan Note (Signed)
History of essential hypertension blood pressure measured at 124/52.  He is on carvedilol

## 2019-09-19 NOTE — Assessment & Plan Note (Signed)
History of peripheral arterial disease status post diamondback orbital rotational arthrectomy by myself 03/19/2014 of severely diseased posterior tibial artery establishing excellent inline flow with ultimate healing of his right great toe ulcer.  I reintervened on him 10/31/2014 with marked improvement in his ABIs.  He denies claudication.

## 2019-09-19 NOTE — Assessment & Plan Note (Signed)
History of obstructive sleep apnea currently not wearing CPAP. °

## 2019-09-19 NOTE — Addendum Note (Signed)
Addended by: Cain Sieve on: 09/19/2019 11:27 AM   Modules accepted: Orders

## 2019-09-23 NOTE — Progress Notes (Signed)
Subjective:  Patient ID: [name], male    DOB: 1946-03-15,  MRN: [id number]  Chief Complaint  Patient presents with  . Wound Check    Pt states right foot ulcer is improving, denies fever/nausea/vomiting/chills. No new concerns.   74 y.o. male returns for follow-up. Thinks the ulcer might be improving.  Review of Systems: Negative except as noted in the HPI. Denies N/V/F/Ch.  Past Medical History:  Diagnosis Date  . Arthritis   . Cardiomyopathy- EF NL 01/2013, now 30-35% echo 03/14/2012  . Carotid artery occlusion    left s/p CEA  . CHF (congestive heart failure) ([place])   . Diabetes mellitus   . GERD (gastroesophageal reflux disease)   . HOH (hard of hearing)   . Hypertension   . Hypothyroidism   . Obstructive sleep apnea   . Orthostatic hypotension   . Osteomyelitis ([place])    left great toe  . Peripheral arterial disease ([place]), s/p PTA x 2 RLE    nonhealing ulcers bilaterally on each great toe  . Pneumonia   . PONV (postoperative nausea and vomiting)   . Restless leg syndrome   . Shortness of breath   . Stroke [facility]) March 08, 2012  . Wears dentures     Current Outpatient Medications:  .  aspirin EC 81 MG EC tablet, Take 1 tablet (81 mg total) by mouth daily. (Patient taking differently: Take 81 mg by mouth at bedtime. ), Disp: , Rfl:  .  b complex vitamins tablet, Take 1 tablet by mouth daily., Disp: , Rfl:  .  carvedilol (COREG) 3.125 MG tablet, TAKE ONE TABLET BY MOUTH TWICE A DAY WITH A MEAL (Patient taking differently: Take 3.125 mg by mouth 2 (two) times daily with a meal. ), Disp: 180 tablet, Rfl: 3 .  Cholecalciferol (VITAMIN D3) 5000 units TABS, Take 5,000 Units by mouth 2 (two) times daily. , Disp: , Rfl:  .  clopidogrel  (PLAVIX) 75 MG tablet, Take 75 mg by mouth daily. , Disp: , Rfl:  .  Continuous Blood Gluc Sensor (FREESTYLE LIBRE 14 DAY SENSOR) MISC, , Disp: , Rfl:  .  Cyanocobalamin (CVS B-12) 1500 MCG TBDP, Take 1,500 mcg by mouth daily. , Disp: , Rfl:  .  DULoxetine (CYMBALTA) 60 MG capsule, Take 60 mg by mouth daily., Disp: , Rfl:  .  furosemide (LASIX) 40 MG tablet, TAKE ONE TABLET (40MG  TOTAL) BY MOUTH TWO TIMES DAILY. (Patient taking differently: Take 40 mg by mouth 2 (two) times daily. ), Disp: 180 tablet, Rfl: 3 .  Ginger, Zingiber officinalis, (GINGER ROOT) 550 MG CAPS, Take 550 mg by mouth daily., Disp: , Rfl:  .  glimepiride (AMARYL) 4 MG tablet, Take 4 mg by mouth daily with breakfast., Disp: , Rfl:  .  levothyroxine (SYNTHROID, LEVOTHROID) 75 MCG tablet, Take 75 mcg by mouth daily before breakfast. , Disp: , Rfl:  .  lubiprostone (AMITIZA) 8 MCG capsule, Take 1 capsule (8 mcg total) by mouth 2 (two) times daily with a meal., Disp: 10 capsule, Rfl: 0 .  nitroGLYCERIN (NITROSTAT) 0.4 MG SL tablet, Place 1 tablet (0.4 mg total) under the tongue every 5 (five) minutes as needed for chest pain., Disp: 25 tablet, Rfl: 3 .  pantoprazole (PROTONIX) 40 MG tablet, Take 40 mg by mouth daily., Disp: , Rfl:  .  potassium chloride SA (K-DUR,KLOR-CON) 20 MEQ tablet, Take 1 tablet (20 mEq total) by mouth daily. Please schedule appointment for refills (Patient taking differently:  Take 20 mEq by mouth every other day. Please schedule appointment for refills), Disp: 90 tablet, Rfl: 3 .  pramipexole (MIRAPEX) 0.25 MG tablet, Take 2 tablets (0.5 mg total) by mouth 2 (two) times daily. Take 0.5mg  by mouth at 7 PM and 0.5mg  at 9 PM daily. (Patient taking differently: Take 0.5 mg by mouth 2 (two) times daily. Take 0.5mg  by mouth at 5 PM and 0.5mg  at 7 PM daily.), Disp: 360 tablet, Rfl: 3 .  rosuvastatin (CRESTOR) 20 MG tablet, Take 20 mg by mouth daily., Disp: , Rfl:  .  thiamine 100 MG tablet, Take by mouth., Disp: , Rfl:     Social History   Tobacco Use  Smoking Status Former Smoker  . Years: 1.00  . Types: Pipe  . Quit date: 07/08/1977  . Years since quitting: 42.2  Smokeless Tobacco Never Used    Allergies  Allergen Reactions  . Quinoline Yellow Ss [Yellow Dye #11]     Pt unsure   . Codeine Nausea And Vomiting  . Tramadol Nausea Only   Objective:   There were no vitals filed for this visit. There is no height or weight on file to calculate BMI. Constitutional Well developed. Well nourished.  Vascular Foot warm and well perfused. Capillary refill normal to all digits.   Neurologic Normal speech. Oriented to person, place, and time. Epicritic sensation to light touch grossly present bilaterally.  Dermatologic Right 1st MPJ ulcer 2x1.5  post-debridement no warmth erythema signs of infection. Hyperkeratosis noted about the periwound  Orthopedic:  Toes rather rectus left foot only slight contracture no pain Hammertoe formation right feet   Radiographs: None today. Assessment:   1. Diabetic ulcer of right midfoot associated with diabetes mellitus due to underlying condition, with muscle involvement without evidence of necrosis ([place])   2. Metatarsal deformity, right   3. Type 2 diabetes mellitus with other circulatory complication, without long-term current use of insulin (HCC)   4. Obesity (BMI 30.0-34.9)   5. Mixed hyperlipidemia   6. Hallux rigidus, right foot    Plan:  Patient was evaluated and treated and all questions answered.  Hx ulcer R foot -Ulcer debrided as below  Procedure: Excisional Debridement of Wound Rationale: Removal of non-viable soft tissue from the wound to promote healing.  Anesthesia: none Pre-Debridement Wound Measurements: 1.5 cm x 1 cm x .02 cm  Post-Debridement Wound Measurements: 2 cm x 1.5 cm x 0.2 cm  Type of Debridement: Sharp Excisional Tissue Removed: Non-viable soft tissue Depth of Debridement: subcutaneous tissue. Technique: Sharp excisional  debridement to bleeding, viable wound base.  Dressing: Dry, sterile, compression dressing. Disposition: Patient tolerated procedure well. Patient to return in 1 week for follow-up.  Metatarsal deformity, hallux rigidus, DM with PAD, neuropathy, obesity, hyperlipidemia, equinus -Had lengthy discussion with patient about possible surgical treatment options. I do think he is a poor candidate to remove the hardware from the first metatarsal phalangeal joint fusion and attempted revision arthrodesis or Keller arthroplasty. We did discuss possible debridement of the ulcer with injection of fat allograft in order to promote healing. Patient is amenable to this. Would perform procedure in the next few weeks. Consent forms reviewed and signed by patient.     No follow-ups on file.

## 2019-09-24 ENCOUNTER — Other Ambulatory Visit: Payer: Self-pay

## 2019-09-24 ENCOUNTER — Other Ambulatory Visit (HOSPITAL_COMMUNITY): Payer: Self-pay | Admitting: Cardiovascular Disease

## 2019-09-24 ENCOUNTER — Other Ambulatory Visit (INDEPENDENT_AMBULATORY_CARE_PROVIDER_SITE_OTHER): Payer: PPO

## 2019-09-24 ENCOUNTER — Ambulatory Visit (HOSPITAL_COMMUNITY)
Admission: RE | Admit: 2019-09-24 | Discharge: 2019-09-24 | Disposition: A | Discharge status: Home / Self Care | Payer: PPO | Source: Ambulatory Visit | Attending: Cardiology | Admitting: Cardiology

## 2019-09-24 DIAGNOSIS — I6523 Occlusion and stenosis of bilateral carotid arteries: Secondary | ICD-10-CM

## 2019-09-24 DIAGNOSIS — I739 Peripheral vascular disease, unspecified: Secondary | ICD-10-CM | POA: Diagnosis not present

## 2019-09-24 DIAGNOSIS — E782 Mixed hyperlipidemia: Secondary | ICD-10-CM

## 2019-09-24 DIAGNOSIS — I10 Essential (primary) hypertension: Secondary | ICD-10-CM

## 2019-09-24 DIAGNOSIS — I429 Cardiomyopathy, unspecified: Secondary | ICD-10-CM | POA: Diagnosis not present

## 2019-09-25 NOTE — Telephone Encounter (Signed)
[name], we received clenpipq samples, no suprep has came. Pt scheduled for 10/11/19. Please advise thanks

## 2019-09-27 MED ORDER — LINACLOTIDE 145 MCG PO CAPS: 145.0000 ug | ORAL_CAPSULE | Freq: Every day | ORAL | 1 refills | Status: DC

## 2019-09-27 NOTE — Telephone Encounter (Signed)
Sent in rx for linzess 129mcq daily to his pharmacy. Could also offer samples.  Patient should let us know if he is unable to get medication or if it doesn't work.

## 2019-09-27 NOTE — Telephone Encounter (Addendum)
Called patient. He states the amitiza 29mcg nor the 5mcg worked well for him. He took them both twice a day and still was having hard time to have BM. Please advise LSL

## 2019-09-27 NOTE — Telephone Encounter (Signed)
OK to use Clenpiq.   Please tell patient that he needs to continue Amitiza BID whichever dose he liked best. WE NEED HIM HAVING A BOWEL MOVEMENT EVERY DAY FOR THE WEEK LEADING UP TO THE BOWEL PREP.  If he is not having daily bowel movements let me know.   HE WAS ONLY GIVEN AMITIZA SAMPLES SO FIND OUT WHAT DOES HE LIKED, 8 OR 24MCG AND I WILL SEND IN RX.

## 2019-09-27 NOTE — Telephone Encounter (Signed)
Called pt. He is aware of below. I have also left samples of linzess for pick up. I have left prep with instructions for pick up as well. Patient aware to call if this does not work for him

## 2019-09-27 NOTE — Addendum Note (Signed)
Addended by: Mahala Menghini on: 09/27/2019 01:56 PM   Modules accepted: Orders

## 2019-10-01 ENCOUNTER — Telehealth: Payer: Self-pay | Admitting: Podiatry

## 2019-10-01 NOTE — Telephone Encounter (Signed)
I'm scheduled for surgery on 01/13 but I need to cancel it. I'm supposed to get a physical before my surgery and I just got a new doctor since ours retired. I could not get in to see my new doctor until 05/15 to get a physical. I need to cancel for right now and will reschedule after I get my physical. Give me a call if there is any questions.

## 2019-10-05 NOTE — Patient Instructions (Signed)
Your procedure is scheduled on: 10/11/2019  Report to [facility] at    9:00 AM.  Call this number if you have problems the morning of surgery: [phone]   Remember:              Follow Directions on the letter you received from Your Physician's office regarding the Bowel Prep  :  Take these medicines the morning of surgery with A SIP OF WATER: Coreg, Cymbalta, Protonix, Synthroid and Mirapex   Do not wear jewelry, make-up or nail polish.    Do not bring valuables to the hospital.  Contacts, dentures or bridgework may not be worn into surgery.  .   Patients discharged the day of surgery will not be allowed to drive home.     Colonoscopy, Adult, Care After This sheet gives you information about how to care for yourself after your procedure. Your health care provider may also give you more specific instructions. If you have problems or questions, contact your health care provider. What can I expect after the procedure? After the procedure, it is common to have:  A small amount of blood in your stool for 24 hours after the procedure.  Some gas.  Mild abdominal cramping or bloating.  Follow these instructions at home: General instructions   For the first 24 hours after the procedure: ? Do not drive or use machinery. ? Do not sign important documents. ? Do not drink alcohol. ? Do your regular daily activities at a slower pace than normal. ? Eat soft, easy-to-digest foods. ? Rest often.  Take over-the-counter or prescription medicines only as told by your health care provider.  It is up to you to get the results of your procedure. Ask your health care provider, or the department performing the procedure, when your results will be ready. Relieving cramping and bloating  Try walking around when you have cramps or feel bloated.  Apply heat to your abdomen as told by your health care provider. Use a heat source that your health care provider recommends, such as a moist heat  pack or a heating pad. ? Place a towel between your skin and the heat source. ? Leave the heat on for 20-30 minutes. ? Remove the heat if your skin turns bright red. This is especially important if you are unable to feel pain, heat, or cold. You may have a greater risk of getting burned. Eating and drinking  Drink enough fluid to keep your urine clear or pale yellow.  Resume your normal diet as instructed by your health care provider. Avoid heavy or fried foods that are hard to digest.  Avoid drinking alcohol for as long as instructed by your health care provider. Contact a health care provider if:  You have blood in your stool 2-3 days after the procedure. Get help right away if:  You have more than a small spotting of blood in your stool.  You pass large blood clots in your stool.  Your abdomen is swollen.  You have nausea or vomiting.  You have a fever.  You have increasing abdominal pain that is not relieved with medicine. This information is not intended to replace advice given to you by your health care provider. Make sure you discuss any questions you have with your health care provider. Document Released: 04/20/2004 Document Revised: 05/31/2016 Document Reviewed: 11/18/2015 Elsevier Interactive Patient Education  2018 [facility], Adult, Care After This sheet gives you information about how to care  for yourself after your procedure. Your health care provider may also give you more specific instructions. If you have problems or questions, contact your health care provider. What can I expect after the procedure? After the procedure, it is common to have:  A sore throat.  Mild stomach pain or discomfort.  Bloating.  Nausea. Follow these instructions at home:   Follow instructions from your health care provider about what to eat or drink after your procedure.  Return to your normal activities as told by your health care provider. Ask your health  care provider what activities are safe for you.  Take over-the-counter and prescription medicines only as told by your health care provider.  Do not drive for 24 hours if you were given a sedative during your procedure.  Keep all follow-up visits as told by your health care provider. This is important. Contact a health care provider if you have:  A sore throat that lasts longer than one day.  Trouble swallowing. Get help right away if:  You vomit blood or your vomit looks like coffee grounds.  You have: ? A fever. ? Bloody, black, or tarry stools. ? A severe sore throat or you cannot swallow. ? Difficulty breathing. ? Severe pain in your chest or abdomen. Summary  After the procedure, it is common to have a sore throat, mild stomach discomfort, bloating, and nausea.  Do not drive for 24 hours if you were given a sedative during the procedure.  Follow instructions from your health care provider about what to eat or drink after your procedure.  Return to your normal activities as told by your health care provider. This information is not intended to replace advice given to you by your health care provider. Make sure you discuss any questions you have with your health care provider. Document Revised: 02/28/2018 Document Reviewed: 02/06/2018 Elsevier Patient Education  [place].

## 2019-10-09 ENCOUNTER — Other Ambulatory Visit (HOSPITAL_COMMUNITY)
Admission: RE | Admit: 2019-10-09 | Discharge: 2019-10-09 | Disposition: A | Discharge status: Home / Self Care | Payer: PPO | Source: Ambulatory Visit | Attending: Internal Medicine | Admitting: Internal Medicine

## 2019-10-09 ENCOUNTER — Other Ambulatory Visit: Payer: Self-pay

## 2019-10-09 ENCOUNTER — Encounter (HOSPITAL_COMMUNITY)
Admission: RE | Admit: 2019-10-09 | Discharge: 2019-10-09 | Disposition: A | Discharge status: Home / Self Care | Payer: PPO | Source: Ambulatory Visit | Attending: Internal Medicine | Admitting: Internal Medicine

## 2019-10-09 ENCOUNTER — Encounter (HOSPITAL_COMMUNITY): Payer: Self-pay

## 2019-10-09 DIAGNOSIS — Z01812 Encounter for preprocedural laboratory examination: Secondary | ICD-10-CM | POA: Diagnosis not present

## 2019-10-09 DIAGNOSIS — R1310 Dysphagia, unspecified: Secondary | ICD-10-CM | POA: Insufficient documentation

## 2019-10-09 DIAGNOSIS — D649 Anemia, unspecified: Secondary | ICD-10-CM | POA: Insufficient documentation

## 2019-10-09 LAB — CBC WITH DIFFERENTIAL/PLATELET
Abs Immature Granulocytes: 0.04 10*3/uL (ref 0.00–0.07)
Basophils Absolute: 0 10*3/uL (ref 0.0–0.1)
Basophils Relative: 0 %
Eosinophils Absolute: 0.1 10*3/uL (ref 0.0–0.5)
Eosinophils Relative: 1 %
HCT: 33.5 % — ABNORMAL LOW (ref 39.0–52.0)
Hemoglobin: 11.5 g/dL — ABNORMAL LOW (ref 13.0–17.0)
Immature Granulocytes: 0 %
Lymphocytes Relative: 11 %
Lymphs Abs: 1 10*3/uL (ref 0.7–4.0)
MCH: 33.4 pg (ref 26.0–34.0)
MCHC: 34.3 g/dL (ref 30.0–36.0)
MCV: 97.4 fL (ref 80.0–100.0)
Monocytes Absolute: 0.4 10*3/uL (ref 0.1–1.0)
Monocytes Relative: 5 %
Neutro Abs: 7.6 10*3/uL (ref 1.7–7.7)
Neutrophils Relative %: 83 %
Platelets: 216 10*3/uL (ref 150–400)
RBC: 3.44 MIL/uL — ABNORMAL LOW (ref 4.22–5.81)
RDW: 13.5 % (ref 11.5–15.5)
WBC: 9.2 10*3/uL (ref 4.0–10.5)
nRBC: 0 % (ref 0.0–0.2)

## 2019-10-09 LAB — BASIC METABOLIC PANEL
Anion gap: 10 (ref 5–15)
BUN: 15 mg/dL (ref 8–23)
CO2: 29 mmol/L (ref 22–32)
Calcium: 8.7 mg/dL — ABNORMAL LOW (ref 8.9–10.3)
Chloride: 93 mmol/L — ABNORMAL LOW (ref 98–111)
Creatinine, Ser: 0.89 mg/dL (ref 0.61–1.24)
GFR calc Af Amer: >60 mL/min (ref >60)
GFR calc non Af Amer: >60 mL/min (ref >60)
Glucose, Bld: 375 mg/dL — ABNORMAL HIGH (ref 70–99)
Potassium: 3.4 mmol/L — ABNORMAL LOW (ref 3.5–5.1)
Sodium: 132 mmol/L — ABNORMAL LOW (ref 135–145)

## 2019-10-09 LAB — GLUCOSE, CAPILLARY: Glucose-Capillary: 360 mg/dL — ABNORMAL HIGH (ref 70–99)

## 2019-10-09 LAB — SARS CORONAVIRUS 2 (TAT 6-24 HRS): SARS Coronavirus 2: NEGATIVE

## 2019-10-11 ENCOUNTER — Ambulatory Visit (HOSPITAL_COMMUNITY): Payer: PPO | Admitting: Anesthesiology

## 2019-10-11 ENCOUNTER — Encounter (HOSPITAL_COMMUNITY)
Admission: RE | Disposition: A | Discharge status: Home / Self Care | Payer: Self-pay | Source: Home / Self Care | Attending: Internal Medicine

## 2019-10-11 ENCOUNTER — Encounter (HOSPITAL_COMMUNITY): Payer: Self-pay | Admitting: Internal Medicine

## 2019-10-11 ENCOUNTER — Ambulatory Visit (HOSPITAL_COMMUNITY)
Admission: RE | Admit: 2019-10-11 | Discharge: 2019-10-11 | Disposition: A | Discharge status: Home / Self Care | Payer: PPO | Attending: Internal Medicine | Admitting: Internal Medicine

## 2019-10-11 DIAGNOSIS — K2100 Gastro-esophageal reflux disease with esophagitis, without bleeding: Secondary | ICD-10-CM

## 2019-10-11 DIAGNOSIS — Z7982 Long term (current) use of aspirin: Secondary | ICD-10-CM | POA: Diagnosis not present

## 2019-10-11 DIAGNOSIS — R1310 Dysphagia, unspecified: Secondary | ICD-10-CM | POA: Insufficient documentation

## 2019-10-11 DIAGNOSIS — I69351 Hemiplegia and hemiparesis following cerebral infarction affecting right dominant side: Secondary | ICD-10-CM | POA: Insufficient documentation

## 2019-10-11 DIAGNOSIS — Z8719 Personal history of other diseases of the digestive system: Secondary | ICD-10-CM | POA: Diagnosis not present

## 2019-10-11 DIAGNOSIS — Z888 Allergy status to other drugs, medicaments and biological substances status: Secondary | ICD-10-CM | POA: Insufficient documentation

## 2019-10-11 DIAGNOSIS — I110 Hypertensive heart disease with heart failure: Secondary | ICD-10-CM | POA: Insufficient documentation

## 2019-10-11 DIAGNOSIS — E039 Hypothyroidism, unspecified: Secondary | ICD-10-CM | POA: Insufficient documentation

## 2019-10-11 DIAGNOSIS — D649 Anemia, unspecified: Secondary | ICD-10-CM

## 2019-10-11 DIAGNOSIS — I951 Orthostatic hypotension: Secondary | ICD-10-CM | POA: Diagnosis not present

## 2019-10-11 DIAGNOSIS — K449 Diaphragmatic hernia without obstruction or gangrene: Secondary | ICD-10-CM | POA: Diagnosis not present

## 2019-10-11 DIAGNOSIS — Z981 Arthrodesis status: Secondary | ICD-10-CM | POA: Insufficient documentation

## 2019-10-11 DIAGNOSIS — Z885 Allergy status to narcotic agent status: Secondary | ICD-10-CM | POA: Insufficient documentation

## 2019-10-11 DIAGNOSIS — M1990 Unspecified osteoarthritis, unspecified site: Secondary | ICD-10-CM | POA: Diagnosis not present

## 2019-10-11 DIAGNOSIS — Z833 Family history of diabetes mellitus: Secondary | ICD-10-CM | POA: Diagnosis not present

## 2019-10-11 DIAGNOSIS — I509 Heart failure, unspecified: Secondary | ICD-10-CM | POA: Insufficient documentation

## 2019-10-11 DIAGNOSIS — K222 Esophageal obstruction: Secondary | ICD-10-CM | POA: Insufficient documentation

## 2019-10-11 DIAGNOSIS — Z7984 Long term (current) use of oral hypoglycemic drugs: Secondary | ICD-10-CM | POA: Diagnosis not present

## 2019-10-11 DIAGNOSIS — G2581 Restless legs syndrome: Secondary | ICD-10-CM | POA: Diagnosis not present

## 2019-10-11 DIAGNOSIS — G4733 Obstructive sleep apnea (adult) (pediatric): Secondary | ICD-10-CM | POA: Diagnosis not present

## 2019-10-11 DIAGNOSIS — I429 Cardiomyopathy, unspecified: Secondary | ICD-10-CM | POA: Insufficient documentation

## 2019-10-11 DIAGNOSIS — Z89412 Acquired absence of left great toe: Secondary | ICD-10-CM | POA: Diagnosis not present

## 2019-10-11 DIAGNOSIS — E1151 Type 2 diabetes mellitus with diabetic peripheral angiopathy without gangrene: Secondary | ICD-10-CM | POA: Diagnosis not present

## 2019-10-11 DIAGNOSIS — Z87891 Personal history of nicotine dependence: Secondary | ICD-10-CM | POA: Insufficient documentation

## 2019-10-11 DIAGNOSIS — R1319 Other dysphagia: Secondary | ICD-10-CM

## 2019-10-11 DIAGNOSIS — R195 Other fecal abnormalities: Secondary | ICD-10-CM

## 2019-10-11 DIAGNOSIS — Z8249 Family history of ischemic heart disease and other diseases of the circulatory system: Secondary | ICD-10-CM | POA: Insufficient documentation

## 2019-10-11 DIAGNOSIS — K219 Gastro-esophageal reflux disease without esophagitis: Secondary | ICD-10-CM | POA: Insufficient documentation

## 2019-10-11 HISTORY — PX: MALONEY DILATION: SHX5535

## 2019-10-11 HISTORY — PX: ESOPHAGOGASTRODUODENOSCOPY (EGD) WITH PROPOFOL: SHX5813

## 2019-10-11 LAB — GLUCOSE, CAPILLARY
Glucose-Capillary: 211 mg/dL — ABNORMAL HIGH (ref 70–99)
Glucose-Capillary: 223 mg/dL — ABNORMAL HIGH (ref 70–99)

## 2019-10-11 SURGERY — ESOPHAGOGASTRODUODENOSCOPY (EGD) WITH PROPOFOL
Anesthesia: General

## 2019-10-11 MED ORDER — CHLORHEXIDINE GLUCONATE CLOTH 2 % EX PADS: 6.0000 | MEDICATED_PAD | Freq: Once | CUTANEOUS | Status: DC

## 2019-10-11 MED ORDER — LACTATED RINGERS IV SOLN
Freq: Once | INTRAVENOUS | Status: AC
  Administered 2019-10-11: 1000 mL via INTRAVENOUS

## 2019-10-11 MED ORDER — KETAMINE HCL 50 MG/5ML IJ SOSY
PREFILLED_SYRINGE | INTRAMUSCULAR | Status: AC
  Filled 2019-10-11: qty 5

## 2019-10-11 MED ORDER — LACTATED RINGERS IV SOLN: INTRAVENOUS | Status: DC | PRN

## 2019-10-11 MED ORDER — PROPOFOL 500 MG/50ML IV EMUL
INTRAVENOUS | Status: DC | PRN
  Administered 2019-10-11: 150 ug/kg/min via INTRAVENOUS

## 2019-10-11 MED ORDER — KETAMINE HCL 10 MG/ML IJ SOLN
INTRAMUSCULAR | Status: DC | PRN
  Administered 2019-10-11: 10 mg via INTRAVENOUS

## 2019-10-11 NOTE — Anesthesia Preprocedure Evaluation (Addendum)
Anesthesia Evaluation  Patient identified by MRN, date of birth, ID band Patient awake    Reviewed: Allergy & Precautions, NPO status , Patient's Chart, lab work & pertinent test results, reviewed documented beta blocker date and time   History of Anesthesia Complications (+) PONV and history of anesthetic complications  Airway Mallampati: II  TM Distance: >3 FB Neck ROM: Full    Dental  (+) Lower Dentures, Upper Dentures   Pulmonary shortness of breath and with exertion, sleep apnea , pneumonia, former smoker,    Pulmonary exam normal breath sounds clear to auscultation       Cardiovascular Exercise Tolerance: Poor METShypertension, Pt. on medications and Pt. on home beta blockers + angina + Peripheral Vascular Disease and +CHF (EF - 30 to 35%)  Normal cardiovascular exam Rhythm:Regular Rate:Normal  EKG - non specific T wave abnormality, prolonged QT  ECHO -2017 - EF 30 to 35% Left ventricle: The cavity size was moderately dilated. Wall   thickness was increased in a pattern of severe LVH. Systolic function was moderately to severely reduced. The estimatedejection fraction was in the range of 30% to 35%. Diffuse hypokinesis. Doppler parameters are consistent with both elevated   ventricular end-diastolic filling pressure and elevated left   atrial filling pressure. - Left atrium: The atrium was moderately to severely dilated. - Atrial septum: No defect or patent foramen ovale was identified.    Neuro/Psych Restless leg syndrome  Neuromuscular disease CVA (right sided weakness), Residual Symptoms negative psych ROS   GI/Hepatic Neg liver ROS, GERD  Medicated,  Endo/Other  diabetes, Well Controlled, Type 2Hypothyroidism   Renal/GU      Musculoskeletal  (+) Arthritis ,   Abdominal   Peds  Hematology  (+) anemia ,   Anesthesia Other Findings   Reproductive/Obstetrics                            Anesthesia Physical Anesthesia Plan  ASA: III  Anesthesia Plan: General   Post-op Pain Management:    Induction: Intravenous  PONV Risk Score and Plan: TIVA  Airway Management Planned: Natural Airway, Nasal Cannula and Simple Face Mask  Additional Equipment:   Intra-op Plan:   Post-operative Plan:   Informed Consent: I have reviewed the patients History and Physical, chart, labs and discussed the procedure including the risks, benefits and alternatives for the proposed anesthesia with the patient or authorized representative who has indicated his/her understanding and acceptance.     Dental advisory given  Plan Discussed with: CRNA and Surgeon  Anesthesia Plan Comments:        Anesthesia Quick Evaluation

## 2019-10-11 NOTE — Anesthesia Postprocedure Evaluation (Signed)
Anesthesia Post Note  Patient: [name]  Procedure(s) Performed: ESOPHAGOGASTRODUODENOSCOPY (EGD) WITH PROPOFOL (N/A ) MALONEY DILATION (N/A )  Patient location during evaluation: PACU Anesthesia Type: General Level of consciousness: awake and alert and oriented Pain management: pain level controlled Vital Signs Assessment: post-procedure vital signs reviewed and stable Respiratory status: spontaneous breathing Cardiovascular status: blood pressure returned to baseline and stable Postop Assessment: no apparent nausea or vomiting Anesthetic complications: no     Last Vitals:  Vitals:   10/11/19 0935 10/11/19 1108  BP: (!) 154/64 128/61  Pulse: 69   Resp: 20   Temp: 37.1 C 37.1 C  SpO2: 94%     Last Pain:  Vitals:   10/11/19 0935  TempSrc: Oral  PainSc: 0-No pain                 [name]

## 2019-10-11 NOTE — Op Note (Addendum)
[name] - [facility] Patient Name: [name] Procedure Date: 10/11/2019 10:18 AM MRN: [id number] Date of Birth: Dec 01, 1945 Attending MD: Norvel Richards , MD CSN: [id number] Age: 74 Admit Type: Outpatient Procedure:                Upper GI endoscopy Indications:              Dysphagia Providers:                Norvel Richards, MD, Janeece Riggers, RN, Aram Candela Referring MD:              Medicines:                Propofol per Anesthesia Complications:            No immediate complications. Estimated Blood Loss:     Estimated blood loss was minimal. Procedure:                Pre-Anesthesia Assessment:                           - Prior to the procedure, a History and Physical                            was performed, and patient medications and                            allergies were reviewed. The patient's tolerance of                            previous anesthesia was also reviewed. The risks                            and benefits of the procedure and the sedation                            options and risks were discussed with the patient.                            All questions were answered, and informed consent                            was obtained. Prior Anticoagulants: The patient                            last took Plavix (clopidogrel) 7 days prior to the                            procedure. ASA Grade Assessment: III - A patient                            with severe systemic disease. After reviewing the  risks and benefits, the patient was deemed in                            satisfactory condition to undergo the procedure.                           After obtaining informed consent, the endoscope was                            passed under direct vision. Throughout the                            procedure, the patient's blood pressure, pulse, and                            oxygen saturations were monitored  continuously. The                            [id number]) scope was introduced through the                            mouth, and advanced to the second part of duodenum.                            The upper GI endoscopy was accomplished without                            difficulty. The patient tolerated the procedure                            well. Scope In: 10:52:27 AM Scope Out: 10:57:52 AM Total Procedure Duration: 0 hours 5 minutes 25 seconds  Findings:      Single short benign-appearing peptic stricture at the EG junction. No       tumor seen; no Barrett's epithelium seen; no esophagitis seen. Adult       gastroscope easily passed across this area.      A medium-sized hiatal hernia was present.      The cardia and gastric fundus were normal on retroflexion.      The duodenal bulb and second portion of the duodenum were normal. The       scope was withdrawn. Dilation was performed with a Maloney dilator with       mild resistance at 52 Fr. The scope was withdrawn. Dilation was       performed with a Maloney dilator with mild resistance at 45 Fr. The       dilation site was examined following endoscope reinsertion and showed       moderate mucosal disruption. Estimated blood loss was minimal. Impression:               - Esophageal stenoses. Dilated.                           - Medium-sized hiatal hernia.                           -  Normal duodenal bulb and second portion of the                            duodenum.                           - No specimens collected. Moderate Sedation:      Moderate (conscious) sedation was personally administered by an       anesthesia professional. The following parameters were monitored: oxygen       saturation, heart rate, blood pressure, respiratory rate, EKG, adequacy       of pulmonary ventilation, and response to care. Recommendation:           - Patient has a contact number available for                            emergencies. The  signs and symptoms of potential                            delayed complications were discussed with the                            patient. Return to normal activities tomorrow.                            Written discharge instructions were provided to the                            patient.                           - Advance diet as tolerated. Resume Plavix today.                            We will reschedule colonoscopy since not prep today. Procedure Code(s):        --- Professional ---                           [phone], Esophagogastroduodenoscopy, flexible,                            transoral; diagnostic, including collection of                            specimen(s) by brushing or washing, when performed                            (separate procedure)                           43450, Dilation of esophagus, by unguided sound or                            bougie, single or multiple passes Diagnosis Code(s):        --- Professional ---  K22.2, Esophageal obstruction                           K44.9, Diaphragmatic hernia without obstruction or                            gangrene                           R13.10, Dysphagia, unspecified CPT copyright 2019 [employer]. All rights reserved. The codes documented in this report are preliminary and upon coder review may  be revised to meet current compliance requirements. [name]. Dawud Mays, MD Norvel Richards, MD 10/11/2019 11:11:32 AM This report has been signed electronically. Number of Addenda: 0

## 2019-10-11 NOTE — Transfer of Care (Signed)
Immediate Anesthesia Transfer of Care Note  Patient: [name]  Procedure(s) Performed: ESOPHAGOGASTRODUODENOSCOPY (EGD) WITH PROPOFOL (N/A ) MALONEY DILATION (N/A )  Patient Location: PACU  Anesthesia Type:General  Level of Consciousness: awake  Airway & Oxygen Therapy: Patient Spontanous Breathing  Post-op Assessment: Report given to RN  Post vital signs: Reviewed  Last Vitals:  Vitals Value Taken Time  BP    Temp    Pulse 80 10/11/19 1107  Resp 13 10/11/19 1107  SpO2 96 % 10/11/19 1107  Vitals shown include unvalidated device data.  Last Pain:  Vitals:   10/11/19 0935  TempSrc: Oral  PainSc: 0-No pain      Patients Stated Pain Goal: 8 ([id number])  Complications: No apparent anesthesia complications

## 2019-10-11 NOTE — H&P (Signed)
@LOGO @   Primary Care Physician:  [name], MD Primary Gastroenterologist:  Dr. Gala Romney  Pre-Procedure History & Physical: HPI:  [name] is a 74 y.o. male here for further evaluation of dysphagia via EGD and possible dilation.  History of positive Cologuard.  Procedure attempted last year unable to complete due to poor prep.  He is passing formed stool in preop today.  States he is not cleaned out.  See nurses corroborate patient's report.  Past Medical History:  Diagnosis Date  . Arthritis   . Cardiomyopathy- EF NL 01/2013, now 30-35% echo 03/14/2012  . Carotid artery occlusion    left s/p CEA  . CHF (congestive heart failure) ([place])   . Diabetes mellitus   . GERD (gastroesophageal reflux disease)   . HOH (hard of hearing)   . Hypertension   . Hypothyroidism   . Obstructive sleep apnea   . Orthostatic hypotension   . Osteomyelitis ([place])    left great toe  . Peripheral arterial disease ([place]), s/p PTA x 2 RLE    nonhealing ulcers bilaterally on each great toe  . Pneumonia   . PONV (postoperative nausea and vomiting)   . Restless leg syndrome   . Shortness of breath   . Stroke [facility]) March 08, 2012  . Wears dentures     Past Surgical History:  Procedure Laterality Date  . AMPUTATION TOE Left 07/25/2018   Procedure: AMPUTATION TOE INTERPHALANGEAL HALLUX LEFT;  Surgeon: Evelina Bucy, DPM;  Location: [place];  Service: Podiatry;  Laterality: Left;  . ANGIOPLASTY  02/28/14   diamond back orbital rotational atherectomy of Rt. tibial  . BACK SURGERY    . BONE BIOPSY Left 07/25/2018   Procedure: SUPERFICIAL BONE BIOPSY;  Surgeon: Evelina Bucy, DPM;  Location: [place];  Service: Podiatry;  Laterality: Left;  . CARDIAC CATHETERIZATION N/A 05/19/2016   Procedure: Right/Left Heart Cath and Coronary Angiography;  Surgeon: Belva Crome, MD;  Location: [place] CV LAB;  Service: Cardiovascular;  Laterality: N/A;  . CERVICAL FUSION    . ENDARTERECTOMY Left 11/29/2013   Procedure: ENDARTERECTOMY CAROTID;  Surgeon: Serafina Mitchell, MD;  Location: [facility] OR;  Service: Vascular;  Laterality: Left;  . ESOPHAGOGASTRODUODENOSCOPY  02/2010   Dr. Gala Romney: patient presented with food impaction, schatzki ring with superimposed component of stricture with erosive reflux esophagitis, s/p disimpaction but dilation planned at later date   . ESOPHAGOGASTRODUODENOSCOPY (EGD) WITH PROPOFOL N/A 08/13/2019   Dr. Gala Romney: Erosive reflux esophagitis with mild stricture and incidental Mallory-Weiss tear which precluded esophageal dilation.  Medium sized hiatal hernia.  [name] EYE SURGERY    . FLEXIBLE SIGMOIDOSCOPY N/A 08/13/2019   Procedure: FLEXIBLE SIGMOIDOSCOPY;  Surgeon: [name], MD;  [facility];  Service: Endoscopy;  Laterality: N/A;  colonoscopy aboerted due to formed stool and poor prep  . FOOT SURGERY    . Lower ext duplex doppler  03/14/14   Rt ABI 1.2  . LOWER EXTREMITY ANGIOGRAM Bilateral 02/18/2014   Procedure: LOWER EXTREMITY ANGIOGRAM;  Surgeon: [name], MD;  Location: [facility] CATH LAB;  Service: Cardiovascular;  Laterality: Bilateral;  . LOWER EXTREMITY ANGIOGRAM N/A 10/31/2014   Procedure: LOWER EXTREMITY ANGIOGRAM;  Surgeon: [name], MD;  Location: [facility] CATH LAB;  Service: Cardiovascular;  Laterality: N/A;  . MULTIPLE TOOTH EXTRACTIONS    . PV angiogram  02/18/2014   tibial vessel diseas bil.  [name] SPINE SURGERY    . tendon achillies lengthing and sesamoid  Prior to Admission medications   Medication Sig Start Date End Date Taking? Authorizing Provider  aspirin EC 81 MG EC tablet Take 1 tablet (81 mg total) by mouth daily. Patient taking differently: Take 81 mg by mouth at bedtime.  05/21/16  Yes Barrett, Evelene Croon, PA-C  b complex vitamins tablet Take 1 tablet by mouth daily.   Yes [provider]  carvedilol (COREG) 3.125 MG tablet TAKE ONE TABLET BY MOUTH TWICE A DAY WITH A MEAL Patient taking differently: Take 3.125 mg by mouth 2  (two) times daily with a meal.  08/29/18  Yes [name], MD  Cholecalciferol (VITAMIN D3) 5000 units TABS Take 5,000 Units by mouth 2 (two) times daily.    Yes [provider]  clopidogrel (PLAVIX) 75 MG tablet Take 75 mg by mouth daily.  06/16/13  Yes [provider]  Cyanocobalamin ([employer] B-12) 1500 MCG TBDP Take 1,500 mcg by mouth daily.    Yes [provider]  DULoxetine (CYMBALTA) 60 MG capsule Take 60 mg by mouth daily. 08/04/19  Yes [provider]  furosemide (LASIX) 40 MG tablet TAKE ONE TABLET (40MG  TOTAL) BY MOUTH TWO TIMES DAILY. Patient taking differently: Take 40 mg by mouth 2 (two) times daily.  07/11/19  Yes [name], MD  glimepiride (AMARYL) 4 MG tablet Take 4 mg by mouth daily with breakfast.   Yes [provider]  levothyroxine (SYNTHROID, LEVOTHROID) 75 MCG tablet Take 75 mcg by mouth daily before breakfast.  02/14/18  Yes [provider]  linaclotide (LINZESS) 145 MCG CAPS capsule Take 1 capsule (145 mcg total) by mouth daily before breakfast. 09/27/19  Yes Mahala Menghini, PA-C  Multiple Vitamin (MULTIVITAMIN WITH MINERALS) TABS tablet Take 1 tablet by mouth daily.   Yes [provider]  pantoprazole (PROTONIX) 40 MG tablet Take 40 mg by mouth daily. 08/15/19  Yes [provider]  potassium chloride SA (K-DUR,KLOR-CON) 20 MEQ tablet Take 1 tablet (20 mEq total) by mouth daily. Please schedule appointment for refills Patient taking differently: Take 20 mEq by mouth every other day. Please schedule appointment for refills 06/14/18  Yes [name], MD  pramipexole (MIRAPEX) 0.25 MG tablet Take 2 tablets (0.5 mg total) by mouth 2 (two) times daily. Take 0.5mg  by mouth at 7 PM and 0.5mg  at 9 PM daily. Patient taking differently: Take 0.5 mg by mouth See admin instructions. Take 0.5 mg by mouth at 5 PM and 0.5mg  at 7 PM daily. 01/24/19  Yes Star Age, MD  rosuvastatin (CRESTOR) 20 MG tablet Take  20 mg by mouth daily.   Yes [provider]  Continuous Blood Gluc Sensor (FREESTYLE LIBRE 14 DAY SENSOR) MISC  09/11/18   [provider]  lubiprostone (AMITIZA) 8 MCG capsule Take 1 capsule (8 mcg total) by mouth 2 (two) times daily with a meal. 09/03/19   Mahala Menghini, PA-C  nitroGLYCERIN (NITROSTAT) 0.4 MG SL tablet Place 1 tablet (0.4 mg total) under the tongue every 5 (five) minutes as needed for chest pain. 05/22/16   Barrett, Evelene Croon, PA-C    Allergies as of 09/04/2019 - Review Complete 09/03/2019  Allergen Reaction Noted  . Quinoline yellow ss [yellow dye #11]  10/09/2018  . Codeine Nausea And Vomiting   . Tramadol Nausea Only 10/23/2012    Family History  Problem Relation Age of Onset  . Heart disease Mother   . Hypertension Mother   . Heart attack Mother   .  Hypertension Father   . Diabetes Father   . Diabetes Son   . Heart disease Son   . Hypertension Son   . Colon cancer Neg Hx     Social History   Socioeconomic History  . Marital status: Married    Spouse name: Not on file  . Number of children: Not on file  . Years of education: college  . Highest education level: Not on file  Occupational History  . Occupation: Firefighter   Tobacco Use  . Smoking status: Former Smoker    Years: 1.00    Types: Pipe    Quit date: 07/08/1977    Years since quitting: 42.2  . Smokeless tobacco: Never Used  Substance and Sexual Activity  . Alcohol use: No    Alcohol/week: 0.0 standard drinks  . Drug use: No  . Sexual activity: Not on file  Other Topics Concern  . Not on file  Social History Narrative   Drinks 3 45oz cokes a day    Social Determinants of Health   Financial Resource Strain:   . Difficulty of Paying Living Expenses: Not on file  Food Insecurity:   . Worried About Charity fundraiser in the Last Year: Not on file  . Ran Out of Food in the Last Year: Not on file  Transportation Needs:   . Lack of Transportation (Medical): Not on  file  . Lack of Transportation (Non-Medical): Not on file  Physical Activity:   . Days of Exercise per Week: Not on file  . Minutes of Exercise per Session: Not on file  Stress:   . Feeling of Stress : Not on file  Social Connections:   . Frequency of Communication with Friends and Family: Not on file  . Frequency of Social Gatherings with Friends and Family: Not on file  . Attends Religious Services: Not on file  . Active Member of Clubs or Organizations: Not on file  . Attends Archivist Meetings: Not on file  . Marital Status: Not on file  Intimate Partner Violence:   . Fear of Current or Ex-Partner: Not on file  . Emotionally Abused: Not on file  . Physically Abused: Not on file  . Sexually Abused: Not on file    Review of Systems: See HPI, otherwise negative ROS  Physical Exam: BP (!) 154/64   Pulse 69   Temp 98.7 F (37.1 C) (Oral)   Resp 20   SpO2 94%  General:   Alert,  Well-developed, well-nourished, pleasant and cooperative in NAD SNeck:  Supple; no masses or thyromegaly. No significant cervical adenopathy. Lungs:  Clear throughout to auscultation.   No wheezes, crackles, or rhonchi. No acute distress. Heart:  Regular rate and rhythm; no murmurs, clicks, rubs,  or gallops. Abdomen: Non-distended, normal bowel sounds.  Soft and nontender without appreciable mass or hepatosplenomegaly.  Pulses:  Normal pulses noted. Extremities:  Without clubbing or edema.  Impression/Plan: 74 year old gentleman with history of Schatzki's ring now with recurrent esophageal dysphagia.  Here for EGD with dilation as feasible/appropriate.  Positive Cologuard patient NOT Prepped.  Will need to plan a strategy to get him done in the near future.  I've offered the patient an EGD with esophageal dilation as feasible/appropriate per plan today.    The risks, benefits, limitations, alternatives and imponderables have been reviewed with the patient. Potential for esophageal dilation,  biopsy, etc. have also been reviewed.  Questions have been answered. All parties agreeable.  Notice: This dictation  was prepared with Dragon dictation along with smaller phrase technology. Any transcriptional errors that result from this process are unintentional and may not be corrected upon review.

## 2019-10-11 NOTE — Discharge Instructions (Signed)
EGD Discharge instructions Please read the instructions outlined below and refer to this sheet in the next few weeks. These discharge instructions provide you with general information on caring for yourself after you leave the hospital. Your doctor may also give you specific instructions. While your treatment has been planned according to the most current medical practices available, unavoidable complications occasionally occur. If you have any problems or questions after discharge, please call your doctor. ACTIVITY  You may resume your regular activity but move at a slower pace for the next 24 hours.   Take frequent rest periods for the next 24 hours.   Walking will help expel (get rid of) the air and reduce the bloated feeling in your abdomen.   No driving for 24 hours (because of the anesthesia (medicine) used during the test).   You may shower.   Do not sign any important legal documents or operate any machinery for 24 hours (because of the anesthesia used during the test).  NUTRITION  Drink plenty of fluids.   You may resume your normal diet.   Begin with a light meal and progress to your normal diet.   Avoid alcoholic beverages for 24 hours or as instructed by your caregiver.  MEDICATIONS  You may resume your normal medications unless your caregiver tells you otherwise.  WHAT YOU CAN EXPECT TODAY  You may experience abdominal discomfort such as a feeling of fullness or gas pains.  FOLLOW-UP  Your doctor will discuss the results of your test with you.  SEEK IMMEDIATE MEDICAL ATTENTION IF ANY OF THE FOLLOWING OCCUR:  Excessive nausea (feeling sick to your stomach) and/or vomiting.   Severe abdominal pain and distention (swelling).   Trouble swallowing.   Temperature over 101 F (37.8 C).   Rectal bleeding or vomiting of blood.    Continue Protonix 40 mg daily  Resume Plavix today  My office will be in touch with you to reschedule a colonoscopy  At patient  request, I called [name], [phone] and reviewed results  Monitored Anesthesia Care, Care After These instructions provide you with information about caring for yourself after your procedure. Your health care provider may also give you more specific instructions. Your treatment has been planned according to current medical practices, but problems sometimes occur. Call your health care provider if you have any problems or questions after your procedure. What can I expect after the procedure? After your procedure, you may:  Feel sleepy for several hours.  Feel clumsy and have poor balance for several hours.  Feel forgetful about what happened after the procedure.  Have poor judgment for several hours.  Feel nauseous or vomit.  Have a sore throat if you had a breathing tube during the procedure. Follow these instructions at home: For at least 24 hours after the procedure:      Have a responsible adult stay with you. It is important to have someone help care for you until you are awake and alert.  Rest as needed.  Do not: ? Participate in activities in which you could fall or become injured. ? Drive. ? Use heavy machinery. ? Drink alcohol. ? Take sleeping pills or medicines that cause drowsiness. ? Make important decisions or sign legal documents. ? Take care of children on your own. Eating and drinking  Follow the diet that is recommended by your health care provider.  If you vomit, drink water, juice, or soup when you can drink without vomiting.  Make sure you have little or  no nausea before eating solid foods. General instructions  Take over-the-counter and prescription medicines only as told by your health care provider.  If you have sleep apnea, surgery and certain medicines can increase your risk for breathing problems. Follow instructions from your health care provider about wearing your sleep device: ? Anytime you are sleeping, including during daytime  naps. ? While taking prescription pain medicines, sleeping medicines, or medicines that make you drowsy.  If you smoke, do not smoke without supervision.  Keep all follow-up visits as told by your health care provider. This is important. Contact a health care provider if:  You keep feeling nauseous or you keep vomiting.  You feel light-headed.  You develop a rash.  You have a fever. Get help right away if:  You have trouble breathing. Summary  For several hours after your procedure, you may feel sleepy and have poor judgment.  Have a responsible adult stay with you for at least 24 hours or until you are awake and alert. This information is not intended to replace advice given to you by your health care provider. Make sure you discuss any questions you have with your health care provider. Document Revised: 12/05/2017 Document Reviewed: 12/28/2015 Elsevier Patient Education  [place].

## 2019-10-11 NOTE — Progress Notes (Signed)
Patient arrived today stating that his stool "is black". Patient given a fleets enema in order to confirm results, tolerated well. The toilet bowl is filled with dark brown thick water. Dr. Gala Romney informed, will postpone this portion of the exam and go ahead with the EGD/Dilation. Patient is understanding with the decision.

## 2019-10-26 ENCOUNTER — Telehealth: Payer: Self-pay | Admitting: Gastroenterology

## 2019-10-26 NOTE — Telephone Encounter (Signed)
Please let pt know that RMR recommends ov to discuss colonoscopy. He has had failed prep twice. First time Plenvu. Second time Clenpiq.  Patient will likely need observation/inpatient bowel prep

## 2019-10-29 ENCOUNTER — Encounter: Payer: Self-pay | Admitting: Internal Medicine

## 2019-10-29 NOTE — Telephone Encounter (Signed)
Spoke with pt. Pt notified of RMR's recommendations. Pt has a f/u apt 11/2019. Pt wanted to let LSL know that he couldn't afford the cost of the Linzess $50.00. insurance paid $350 and pt can't afford the remainder.  Discussed trying pt assistance with pt. Pt assistance paper work mailed to pt. Pt is aware that he must submit his income along with paper work.

## 2019-10-29 NOTE — Telephone Encounter (Signed)
Noted. Patient failed miralax. Let's see if we can get him assistance. Offer samples if available in meantime (Ok to give any dose. Take once daily).

## 2019-10-29 NOTE — Telephone Encounter (Signed)
Lmom, waiting on a return call.  

## 2019-10-29 NOTE — Telephone Encounter (Signed)
PATIENT SCHEDULED  °

## 2019-10-30 NOTE — Telephone Encounter (Signed)
Noted. Spoke with pt. Pt is going to pickup samples of Linzess 290 mcg one tab daily 30 mins before breakfast. Pt assistance forms placed in the bag. Pt is aware.

## 2019-11-07 ENCOUNTER — Ambulatory Visit: Payer: PPO | Attending: Internal Medicine

## 2019-11-07 DIAGNOSIS — Z23 Encounter for immunization: Secondary | ICD-10-CM | POA: Insufficient documentation

## 2019-11-07 NOTE — Progress Notes (Signed)
   Covid-19 Vaccination Clinic  Name:  [name]    MRN: [id number] DOB: 1945-12-01  11/07/2019  [name] was observed post Covid-19 immunization for 30 minutes based on pre-vaccination screening without incidence. He was provided with Vaccine Information Sheet and instruction to access the V-Safe system.   [name] was instructed to call 911 with any severe reactions post vaccine: [name] Difficulty breathing  . Swelling of your face and throat  . A fast heartbeat  . A bad rash all over your body  . Dizziness and weakness    Immunizations Administered    Name Date Dose VIS Date Route   Moderna COVID-19 Vaccine 11/07/2019 12:14 PM 0.5 mL 08/21/2019 Intramuscular   Manufacturer: Moderna   Lot: [id number]   [id number]

## 2019-11-14 ENCOUNTER — Other Ambulatory Visit: Payer: Self-pay

## 2019-11-14 ENCOUNTER — Ambulatory Visit (INDEPENDENT_AMBULATORY_CARE_PROVIDER_SITE_OTHER): Payer: PPO | Admitting: Podiatry

## 2019-11-14 ENCOUNTER — Encounter: Payer: Self-pay | Admitting: Podiatry

## 2019-11-14 VITALS — Temp 97.3°F

## 2019-11-14 DIAGNOSIS — L97415 Non-pressure chronic ulcer of right heel and midfoot with muscle involvement without evidence of necrosis: Secondary | ICD-10-CM | POA: Diagnosis not present

## 2019-11-14 DIAGNOSIS — B351 Tinea unguium: Secondary | ICD-10-CM | POA: Diagnosis not present

## 2019-11-14 DIAGNOSIS — E1159 Type 2 diabetes mellitus with other circulatory complications: Secondary | ICD-10-CM | POA: Diagnosis not present

## 2019-11-14 DIAGNOSIS — E08621 Diabetes mellitus due to underlying condition with foot ulcer: Secondary | ICD-10-CM

## 2019-11-14 NOTE — Patient Instructions (Signed)
Diabetes Mellitus and Foot Care Foot care is an important part of your health, especially when you have diabetes. Diabetes may cause you to have problems because of poor blood flow (circulation) to your feet and legs, which can cause your skin to:  Become thinner and drier.  Break more easily.  Heal more slowly.  Peel and crack. You may also have nerve damage (neuropathy) in your legs and feet, causing decreased feeling in them. This means that you may not notice minor injuries to your feet that could lead to more serious problems. Noticing and addressing any potential problems early is the best way to prevent future foot problems. How to care for your feet Foot hygiene  Wash your feet daily with warm water and mild soap. Do not use hot water. Then, pat your feet and the areas between your toes until they are completely dry. Do not soak your feet as this can dry your skin.  Trim your toenails straight across. Do not dig under them or around the cuticle. File the edges of your nails with an emery board or nail file.  Apply a moisturizing lotion or petroleum jelly to the skin on your feet and to dry, brittle toenails. Use lotion that does not contain alcohol and is unscented. Do not apply lotion between your toes. Shoes and socks  Wear clean socks or stockings every day. Make sure they are not too tight. Do not wear knee-high stockings since they may decrease blood flow to your legs.  Wear shoes that fit properly and have enough cushioning. Always look in your shoes before you put them on to be sure there are no objects inside.  To break in new shoes, wear them for just a few hours a day. This prevents injuries on your feet. Wounds, scrapes, corns, and calluses  Check your feet daily for blisters, cuts, bruises, sores, and redness. If you cannot see the bottom of your feet, use a mirror or ask someone for help.  Do not cut corns or calluses or try to remove them with medicine.  If you  find a minor scrape, cut, or break in the skin on your feet, keep it and the skin around it clean and dry. You may clean these areas with mild soap and water. Do not clean the area with peroxide, alcohol, or iodine.  If you have a wound, scrape, corn, or callus on your foot, look at it several times a day to make sure it is healing and not infected. Check for: ? Redness, swelling, or pain. ? Fluid or blood. ? Warmth. ? Pus or a bad smell. General instructions  Do not cross your legs. This may decrease blood flow to your feet.  Do not use heating pads or hot water bottles on your feet. They may burn your skin. If you have lost feeling in your feet or legs, you may not know this is happening until it is too late.  Protect your feet from hot and cold by wearing shoes, such as at the beach or on hot pavement.  Schedule a complete foot exam at least once a year (annually) or more often if you have foot problems. If you have foot problems, report any cuts, sores, or bruises to your health care provider immediately. Contact a health care provider if:  You have a medical condition that increases your risk of infection and you have any cuts, sores, or bruises on your feet.  You have an injury that is not   healing.  You have redness on your legs or feet.  You feel burning or tingling in your legs or feet.  You have pain or cramps in your legs and feet.  Your legs or feet are numb.  Your feet always feel cold.  You have pain around a toenail. Get help right away if:  You have a wound, scrape, corn, or callus on your foot and: ? You have pain, swelling, or redness that gets worse. ? You have fluid or blood coming from the wound, scrape, corn, or callus. ? Your wound, scrape, corn, or callus feels warm to the touch. ? You have pus or a bad smell coming from the wound, scrape, corn, or callus. ? You have a fever. ? You have a red line going up your leg. Summary  Check your feet every day  for cuts, sores, red spots, swelling, and blisters.  Moisturize feet and legs daily.  Wear shoes that fit properly and have enough cushioning.  If you have foot problems, report any cuts, sores, or bruises to your health care provider immediately.  Schedule a complete foot exam at least once a year (annually) or more often if you have foot problems. This information is not intended to replace advice given to you by your health care provider. Make sure you discuss any questions you have with your health care provider. Document Revised: 05/30/2019 Document Reviewed: 10/08/2016 Elsevier Patient Education  2020 [employer]  

## 2019-11-15 NOTE — Progress Notes (Signed)
Subjective: [name] presents today for follow up of at risk foot care. Patient has h/o digital amputation left, hallux:. He relates he has missed appointment with Dr. March Rummage due to getting his COVID-19 vaccination. He relates they were discussing possible surgery for his chronic ulceration of the right foot.   Allergies  Allergen Reactions  . Quinoline Yellow Ss [Yellow Dye #11]     Pt unsure   . Codeine Nausea And Vomiting  . Tramadol Nausea Only     Objective: Vitals:   11/14/19 1036  Temp: (!) 97.3 F (36.3 C)    Vascular Examination:  Capillary refill time to digits immediate b/l, palpable DP pulses b/l, non-palpable PT pulses b/l, pedal hair absent b/l and skin temperature gradient within normal limits b/l  Dermatological Examination: Pedal skin is thin shiny, atrophic bilaterally, no interdigital macerations bilaterally, toenails 1-5 right, 2-5 left elongated, dystrophic, thickened, crumbly with subungual debris, chronic ulceration noted plantar aspect of right foot, submetatarsal head 1 with red, granular base, hyperkeratotic rim, no drainage, no odor. There appears to be undermining. No periulcerative erythema, no edema, no increased warmth. No ischemia/gangrene noted.  Musculoskeletal: Normal muscle strength 5/5 to all lower extremity muscle groups bilaterally, no pain crepitus or joint limitation noted with ROM b/l and digital amputation left hallux  Neurological: Protective sensation intact 5/5 intact bilaterally with 10g monofilament b/l  Assessment: 1. Onychomycosis   2. Diabetic ulcer of right midfoot associated with diabetes mellitus due to underlying condition, with muscle involvement without evidence of necrosis ([place])   3. Type 2 diabetes mellitus with other circulatory complication, without long-term current use of insulin ([place])    Plan: -Continue diabetic foot care principles. Literature dispensed on today.  -Toenails 1-5 right, 2-5 left foot were  debrided in length and girth with sterile nail nippers and dremel without iatrogenic bleeding.  -Dressing changed to Iodosorb Gel for right foot ulcer and he will see Dr. March Rummage on Friday. -Patient to continue soft, supportive shoe gear daily. -Patient to report any pedal injuries to medical professional immediately. -Patient scheduled to see Dr. Hardie Pulley for follow-up of right foot ulceration on Friday, February 26th at 11:30 a.m. -Patient/POA to call should there be question/concern in the interim.  Return in about 3 days (around 11/17/2019) for diabetic ulcer follow up with Dr. March Rummage Friday at 11:30.

## 2019-11-16 ENCOUNTER — Ambulatory Visit: Payer: PPO | Admitting: Podiatry

## 2019-11-16 ENCOUNTER — Other Ambulatory Visit: Payer: Self-pay

## 2019-11-16 VITALS — Temp 96.6°F

## 2019-11-16 DIAGNOSIS — L97415 Non-pressure chronic ulcer of right heel and midfoot with muscle involvement without evidence of necrosis: Secondary | ICD-10-CM | POA: Diagnosis not present

## 2019-11-16 DIAGNOSIS — E08621 Diabetes mellitus due to underlying condition with foot ulcer: Secondary | ICD-10-CM

## 2019-11-16 NOTE — Progress Notes (Signed)
  Subjective:  Patient ID: [name], male    DOB: 03-15-1946,  MRN: [id number]  Chief Complaint  Patient presents with  . Wound Check    Diabetic ulcer, R foot. Pt stated, "It's okay. I have some pain occasionally, but I'm applying Triple Antibiotic Ointment w/ Pain Relief. I'm scrubbing around the wound, then applying a round cotton pad + paper tape. I have not been able to check my glucose; my PCP retired, and I need a new prescription".    74 y.o. male presents for wound care. Hx confirmed with patient.  Objective:  Physical Exam: Wound Location: Right submet 1 Wound Measurement: 2x2x post-debridement Wound Base: Granular/Healthy Peri-wound: Calloused Exudate: Scant/small amount Serosanguinous exudate wound without warmth, erythema, signs of acute infection   Assessment:   1. Diabetic ulcer of right midfoot associated with diabetes mellitus due to underlying condition, with muscle involvement without evidence of necrosis [facility])     Plan:  Patient was evaluated and treated and all questions answered.  Ulcer Right 1st MPJ -Dressing applied consisting of medihoney and Mepilex border dressing  -Wound cleansed and debrided Procedure: Excisional Debridement of Wound Rationale: Removal of non-viable soft tissue from the wound to promote healing.  Anesthesia: none Pre-Debridement Wound Measurements: 1.5 cm x 1.5 cm x 0.4 cm  Post-Debridement Wound Measurements: 2 cm x 2 cm x 0.4 cm  Type of Debridement: Sharp Excisional Tissue Removed: Non-viable soft tissue Depth of Debridement: subcutaneous tissue. Technique: Sharp excisional debridement to bleeding, viable wound base.  Dressing: Dry, sterile, compression dressing. Disposition: Patient tolerated procedure well. Patient to return in 1 week for follow-up.  Return in about 1 month (around 12/14/2019).

## 2019-11-27 ENCOUNTER — Other Ambulatory Visit: Payer: Self-pay

## 2019-11-27 ENCOUNTER — Encounter: Payer: Self-pay | Admitting: Gastroenterology

## 2019-11-27 ENCOUNTER — Ambulatory Visit (INDEPENDENT_AMBULATORY_CARE_PROVIDER_SITE_OTHER): Payer: PPO | Admitting: Gastroenterology

## 2019-11-27 VITALS — BP 124/68 | HR 79 | Temp 96.9°F | Ht 69.0 in | Wt 227.6 lb

## 2019-11-27 DIAGNOSIS — R195 Other fecal abnormalities: Secondary | ICD-10-CM

## 2019-11-27 DIAGNOSIS — K5900 Constipation, unspecified: Secondary | ICD-10-CM

## 2019-11-27 NOTE — Assessment & Plan Note (Addendum)
Very pleasant 74 year old gentleman with history of positive Cologuard testing back in the summer 2020, mild stable anemia presenting for follow-up. Attempted colonoscopy in November but prep was poor so procedure was aborted. Second attempt in January, patient presented passing solid stool therefore procedure was canceled. Baseline constipation is now well managed on Linzess 290 mcg daily. Difficulty affording medication. Patient's wife does not want to complete patient assistance forms due to privacy issues. We will supplement patient with samples as much as possible but he prefers to go back to MiraLAX to see if this is helpful now that he has better BMs. He will titrate dose as needed, no more than 2 capfuls daily.  Discussed with patient today, Dr. Gala Romney is advising for inpatient bowel prep to assist with completing colonoscopy. Patient is not opposed to this but continues to have Covid risk concerns and would like to reassess in a couple of months. We will have him come back in May, will update CBC and obtain a ferritin at that time.  If he decides he wants to consider sooner, he will call let me know and we will make arrangements.

## 2019-11-27 NOTE — Progress Notes (Signed)
Primary Care Physician: Patient, No Pcp Per  Primary Gastroenterologist:  Garfield Cornea, MD   Chief Complaint  Patient presents with  . Colonoscopy    consult    HPI: [name] is a 74 y.o. male here for follow-up of positive Cologuard completed back in the summer 2020.    Colonoscopy attempted November 2020 but unable to complete due to poor prep.  He took Plenvu at the time but noted that he vomited the second dose the morning of the procedure.  Second attempt back in January 21 but continued to pass solid stool in preop so procedure was canceled.  He took Clenpiq. Patient has never had a complete colonoscopy.  EGD in January 2021 with esophageal stenosis status post dilation.,  Moderate hiatal hernia.  Patient has history of chronic normocytic anemia with hemoglobin typically in the 10-11 range.  In January 2021 his hemoglobin was 11.5.  Patient has chronic constipation, typically bowel movement twice per week.  Failed MiraLAX. He has been doing well on Linzess 290 mcg daily. Good BMs without diarrhea, but can't afford the $50 per month. Also $350 per month thoughts to his insurance allowance. Wife did not want to complete paperwork for patient assistance due to privacy issues. Due to the expense of ongoing Linzess, he would like to go back to [facility] on a regular basis to see if it helps now that  his bowel movements are much better. We will also supplement with samples as needed. Dysphagia better. No heartburn. No melena, brbpr. States he is eating iron rich food now.    Current Outpatient Medications  Medication Sig Dispense Refill  . aspirin EC 81 MG EC tablet Take 1 tablet (81 mg total) by mouth daily. (Patient taking differently: Take 81 mg by mouth at bedtime. )    . b complex vitamins tablet Take 1 tablet by mouth daily.    . carvedilol (COREG) 3.125 MG tablet TAKE ONE TABLET BY MOUTH TWICE A DAY WITH A MEAL (Patient taking differently: Take 3.125 mg by mouth 2  (two) times daily with a meal. ) 180 tablet 3  . Cholecalciferol (VITAMIN D3) 5000 units TABS Take 5,000 Units by mouth 2 (two) times daily.     . clopidogrel (PLAVIX) 75 MG tablet Take 75 mg by mouth daily.     . Continuous Blood Gluc Sensor (FREESTYLE LIBRE 14 DAY SENSOR) MISC     . Cyanocobalamin ([employer] B-12) 1500 MCG TBDP Take 1,500 mcg by mouth daily.     . DULoxetine (CYMBALTA) 60 MG capsule Take 60 mg by mouth daily.    . furosemide (LASIX) 40 MG tablet TAKE ONE TABLET (40MG  TOTAL) BY MOUTH TWO TIMES DAILY. (Patient taking differently: Take 40 mg by mouth 2 (two) times daily. ) 180 tablet 3  . glimepiride (AMARYL) 4 MG tablet Take 4 mg by mouth daily with breakfast.    . levothyroxine (SYNTHROID, LEVOTHROID) 75 MCG tablet Take 75 mcg by mouth daily before breakfast.     . linaclotide (LINZESS) 145 MCG CAPS capsule Take 1 capsule (145 mcg total) by mouth daily before breakfast. 30 capsule 1  . Multiple Vitamin (MULTIVITAMIN WITH MINERALS) TABS tablet Take 1 tablet by mouth daily.    . nitroGLYCERIN (NITROSTAT) 0.4 MG SL tablet Place 1 tablet (0.4 mg total) under the tongue every 5 (five) minutes as needed for chest pain. 25 tablet 3  . pantoprazole (PROTONIX) 40 MG tablet Take 40 mg by mouth daily.    [name]  potassium chloride SA (K-DUR,KLOR-CON) 20 MEQ tablet Take 1 tablet (20 mEq total) by mouth daily. Please schedule appointment for refills (Patient taking differently: Take 20 mEq by mouth every other day. Please schedule appointment for refills) 90 tablet 3  . pramipexole (MIRAPEX) 0.25 MG tablet Take 2 tablets (0.5 mg total) by mouth 2 (two) times daily. Take 0.5mg  by mouth at 7 PM and 0.5mg  at 9 PM daily. (Patient taking differently: Take 0.5 mg by mouth See admin instructions. Take 0.5 mg by mouth at 5 PM and 0.5mg  at 7 PM daily.) 360 tablet 3  . rosuvastatin (CRESTOR) 20 MG tablet Take 20 mg by mouth daily.     No current facility-administered medications for this visit.    Allergies as of  11/27/2019 - Review Complete 11/27/2019  Allergen Reaction Noted  . Quinoline yellow ss [yellow dye #11]  10/09/2018  . Codeine Nausea And Vomiting   . Tramadol Nausea Only 10/23/2012    ROS:  General: Negative for anorexia, weight loss, fever, chills, fatigue, weakness. ENT: Negative for hoarseness, difficulty swallowing , nasal congestion. CV: Negative for chest pain, angina, palpitations, dyspnea on exertion, peripheral edema.  Respiratory: Negative for dyspnea at rest, dyspnea on exertion, cough, sputum, wheezing.  GI: See history of present illness. GU:  Negative for dysuria, hematuria, urinary incontinence, urinary frequency, nocturnal urination.  Endo: Negative for unusual weight change.    Physical Examination:   BP 124/68   Pulse 79   Temp (!) 96.9 F (36.1 C) (Temporal)   Ht 5\' 9"  (1.753 m)   Wt 227 lb 9.6 oz (103.2 kg)   BMI 33.61 kg/m   General: Well-nourished, well-developed in no acute distress.  Eyes: No icterus. Mouth: masked Lungs: Clear to auscultation bilaterally.  Heart: Regular rate and rhythm, no murmurs rubs or gallops.  Abdomen: Bowel sounds are normal, nontender, nondistended, no hepatosplenomegaly or masses, no abdominal bruits or hernia , no rebound or guarding.   Extremities: No lower extremity edema. No clubbing or deformities. Neuro: Alert and oriented x 4   Skin: Warm and dry, no jaundice.   Psych: Alert and cooperative, normal mood and affect.  Labs:  Lab Results  Component Value Date   CREATININE 0.89 10/09/2019   BUN 15 10/09/2019   NA 132 (L) 10/09/2019   K 3.4 (L) 10/09/2019   CL 93 (L) 10/09/2019   CO2 29 10/09/2019    Lab Results  Component Value Date   WBC 9.2 10/09/2019   HGB 11.5 (L) 10/09/2019   HCT 33.5 (L) 10/09/2019   MCV 97.4 10/09/2019   PLT 216 10/09/2019     Imaging Studies: No results found.

## 2019-11-27 NOTE — Patient Instructions (Addendum)
1. Try Miralax again as discussed. No more than two capfuls daily. I will also provide you with Linzess 268mcg samples as much as possible to use when available.  2. We will have you come back in May to see if you are ready to consider inpatient bowel prep for colonoscopy. If you decide to pursue sooner, just gives [place] a call.

## 2019-12-05 ENCOUNTER — Ambulatory Visit: Payer: PPO | Attending: Internal Medicine

## 2019-12-05 DIAGNOSIS — Z23 Encounter for immunization: Secondary | ICD-10-CM

## 2019-12-05 NOTE — Progress Notes (Signed)
   Covid-19 Vaccination Clinic  Name:  [name]    MRN: [id number] DOB: 04-01-1946  12/05/2019  [name] was observed post Covid-19 immunization for 15 minutes without incident. He was provided with Vaccine Information Sheet and instruction to access the V-Safe system.   [name] was instructed to call 911 with any severe reactions post vaccine: [name] Difficulty breathing  . Swelling of face and throat  . A fast heartbeat  . A bad rash all over body  . Dizziness and weakness   Immunizations Administered    Name Date Dose VIS Date Route   Moderna COVID-19 Vaccine 12/05/2019 11:50 AM 0.5 mL 08/21/2019 Intramuscular   Manufacturer: Moderna   Lot: [id number]   [id number]

## 2019-12-07 ENCOUNTER — Other Ambulatory Visit: Payer: Self-pay | Admitting: Cardiovascular Disease

## 2019-12-14 ENCOUNTER — Other Ambulatory Visit: Payer: Self-pay

## 2019-12-14 ENCOUNTER — Ambulatory Visit: Payer: PPO | Admitting: Podiatry

## 2019-12-14 DIAGNOSIS — L97415 Non-pressure chronic ulcer of right heel and midfoot with muscle involvement without evidence of necrosis: Secondary | ICD-10-CM

## 2019-12-14 DIAGNOSIS — E08621 Diabetes mellitus due to underlying condition with foot ulcer: Secondary | ICD-10-CM

## 2020-01-23 ENCOUNTER — Ambulatory Visit: Payer: PPO | Admitting: Podiatry

## 2020-01-28 ENCOUNTER — Telehealth: Payer: Self-pay | Admitting: *Deleted

## 2020-01-28 NOTE — Telephone Encounter (Signed)
[name], you are scheduled for a virtual visit with your provider today.  Just as we do with appointments in the office, we must obtain your consent to participate.  Your consent will be active for this visit and any virtual visit you may have with one of our providers in the next 365 days.  If you have a MyChart account, I can also send a copy of this consent to you electronically.  All virtual visits are billed to your insurance company just like a traditional visit in the office.  As this is a virtual visit, video technology does not allow for your provider to perform a traditional examination.  This may limit your provider's ability to fully assess your condition.  If your provider identifies any concerns that need to be evaluated in person or the need to arrange testing such as labs, EKG, etc, we will make arrangements to do so.  Although advances in technology are sophisticated, we cannot ensure that it will always work on either your end or our end.  If the connection with a video visit is poor, we may have to switch to a telephone visit.  With either a video or telephone visit, we are not always able to ensure that we have a secure connection.   I need to obtain your verbal consent now.   Are you willing to proceed with your visit today?

## 2020-01-28 NOTE — Telephone Encounter (Signed)
Pt consented to a virtual visit. 

## 2020-01-29 ENCOUNTER — Ambulatory Visit: Payer: PPO | Admitting: Family Medicine

## 2020-02-05 ENCOUNTER — Telehealth (INDEPENDENT_AMBULATORY_CARE_PROVIDER_SITE_OTHER): Payer: PPO | Admitting: Gastroenterology

## 2020-02-05 ENCOUNTER — Encounter: Payer: Self-pay | Admitting: Gastroenterology

## 2020-02-05 ENCOUNTER — Other Ambulatory Visit: Payer: Self-pay

## 2020-02-05 DIAGNOSIS — K5900 Constipation, unspecified: Secondary | ICD-10-CM | POA: Diagnosis not present

## 2020-02-05 DIAGNOSIS — R195 Other fecal abnormalities: Secondary | ICD-10-CM

## 2020-02-05 DIAGNOSIS — D649 Anemia, unspecified: Secondary | ICD-10-CM | POA: Diagnosis not present

## 2020-02-05 NOTE — Progress Notes (Signed)
Primary Care Physician:  Patient, No Pcp Per Primary GI:  Garfield Cornea, MD    Patient Location: Home  Provider Location: [place] office  Reason for Visit:  Chief Complaint  Patient presents with  . Constipation    constipation better,positive cologuard test     Persons present on the virtual encounter, with roles: Patient, myself (provider),Angela Stallings CMA (updated meds and allergies)  Total time (minutes) spent on medical discussion: 15 minutes  Due to COVID-19, visit was conducted using Mychart video method.  Visit was requested by patient.  Virtual Visit via Mychart video  I connected with [name] on 02/05/20 at  9:30 AM EDT by Mychart video and verified that I am speaking with the correct person using two identifiers.   I discussed the limitations, risks, security and privacy concerns of performing an evaluation and management service by telephone/video and the availability of in person appointments. I also discussed with the patient that there may be a patient responsible charge related to this service. The patient expressed understanding and agreed to proceed.   HPI:   CELVIN PARA is a 74 y.o. male who presents for virtual visit regarding positive Cologuard (2020), constipation.  Patient was last seen in March 2021.  He had attempted colonoscopy November 2020 but unable to complete due to poor prep.He took Plenvu at the time but noted that he vomited the second dose the morning of the procedure.  Second attempt back in January 21 but continued to pass solid stool in preop so procedure was canceled.  He took Clenpiq. Patient has never had a complete colonoscopy.  EGD November 2020 with erosive reflux esophagitis with mild stricture and incidental Mallory-Weiss tear which precluded esophageal dilation, medium sized hiatal hernia.  EGD in January 2021 with esophageal stenosis status post dilation, Moderate hiatal hernia.Patient has history of chronic  normocytic anemia with hemoglobin typically in the 10-11 range.  In January 2021 his hemoglobin was 11.5.  States his constipation is much better now.  Taking Linzess 290 mcg once every 3 days.  Did not tolerate every day.  Samples have been provided as he is unable to afford medication.  Bowel movements have been regular, staying "cleaned out".  Not on MiraLAX.  No melena or rectal bleeding.  No abdominal pain.  No dysphagia or heartburn.  Due to bowel prep failure x2, plans for colonoscopy with inpatient bowel prep as recommended by Dr. Gala Romney.  Patient postponed after last office visit due to Covid but is interested in getting scheduled at this time.  Current Outpatient Medications  Medication Sig Dispense Refill  . aspirin EC 81 MG EC tablet Take 1 tablet (81 mg total) by mouth daily. (Patient taking differently: Take 81 mg by mouth at bedtime. )    . b complex vitamins tablet Take 1 tablet by mouth daily.    . carvedilol (COREG) 3.125 MG tablet Take 1 tablet (3.125 mg total) by mouth 2 (two) times daily with a meal. 180 tablet 3  . Cholecalciferol (VITAMIN D3) 5000 units TABS Take 5,000 Units by mouth 2 (two) times daily.     . clopidogrel (PLAVIX) 75 MG tablet Take 75 mg by mouth daily.     . Continuous Blood Gluc Receiver (FREESTYLE LIBRE 14 DAY READER) DEVI     . Continuous Blood Gluc Sensor (FREESTYLE LIBRE 14 DAY SENSOR) MISC     . Cyanocobalamin ([employer] B-12) 1500 MCG TBDP Take 1,500 mcg by mouth daily.     [name]  DULoxetine (CYMBALTA) 60 MG capsule Take 60 mg by mouth daily.    . furosemide (LASIX) 40 MG tablet TAKE ONE TABLET (40MG  TOTAL) BY MOUTH TWO TIMES DAILY. (Patient taking differently: Take 40 mg by mouth 2 (two) times daily. ) 180 tablet 3  . glimepiride (AMARYL) 4 MG tablet Take 4 mg by mouth daily with breakfast.    . levothyroxine (SYNTHROID, LEVOTHROID) 75 MCG tablet Take 75 mcg by mouth daily before breakfast.     . linaclotide (LINZESS) 290 MCG CAPS capsule Take 290 mcg by  mouth daily before breakfast.    . Multiple Vitamin (MULTIVITAMIN WITH MINERALS) TABS tablet Take 1 tablet by mouth daily.    . nitroGLYCERIN (NITROSTAT) 0.4 MG SL tablet Place 1 tablet (0.4 mg total) under the tongue every 5 (five) minutes as needed for chest pain. 25 tablet 3  . pantoprazole (PROTONIX) 40 MG tablet Take 40 mg by mouth daily.    . potassium chloride SA (K-DUR,KLOR-CON) 20 MEQ tablet Take 1 tablet (20 mEq total) by mouth daily. Please schedule appointment for refills (Patient taking differently: Take 20 mEq by mouth every other day. Please schedule appointment for refills) 90 tablet 3  . pramipexole (MIRAPEX) 0.25 MG tablet Take 2 tablets (0.5 mg total) by mouth 2 (two) times daily. Take 0.5mg  by mouth at 7 PM and 0.5mg  at 9 PM daily. (Patient taking differently: Take 0.5 mg by mouth See admin instructions. Take 0.5 mg by mouth at 5 PM and 0.5mg  at 7 PM daily.) 360 tablet 3  . rosuvastatin (CRESTOR) 20 MG tablet Take 20 mg by mouth daily.     No current facility-administered medications for this visit.    Past Medical History:  Diagnosis Date  . Arthritis   . Cardiomyopathy- EF NL 01/2013, now 30-35% echo 03/14/2012  . Carotid artery occlusion    left s/p CEA  . CHF (congestive heart failure) ([place])   . Diabetes mellitus   . GERD (gastroesophageal reflux disease)   . HOH (hard of hearing)   . Hypertension   . Hypothyroidism   . Obstructive sleep apnea   . Orthostatic hypotension   . Osteomyelitis ([place])    left great toe  . Peripheral arterial disease ([place]), s/p PTA x 2 RLE    nonhealing ulcers bilaterally on each great toe  . Pneumonia   . PONV (postoperative nausea and vomiting)   . Restless leg syndrome   . Shortness of breath   . Stroke [facility]) March 08, 2012  . Wears dentures     Past Surgical History:  Procedure Laterality Date  . AMPUTATION TOE Left 07/25/2018   Procedure: AMPUTATION TOE INTERPHALANGEAL HALLUX LEFT;  Surgeon: Evelina Bucy, DPM;  Location:  [place];  Service: Podiatry;  Laterality: Left;  . ANGIOPLASTY  02/28/14   diamond back orbital rotational atherectomy of Rt. tibial  . BACK SURGERY    . BONE BIOPSY Left 07/25/2018   Procedure: SUPERFICIAL BONE BIOPSY;  Surgeon: Evelina Bucy, DPM;  Location: [place];  Service: Podiatry;  Laterality: Left;  . CARDIAC CATHETERIZATION N/A 05/19/2016   Procedure: Right/Left Heart Cath and Coronary Angiography;  Surgeon: Belva Crome, MD;  Location: [place] CV LAB;  Service: Cardiovascular;  Laterality: N/A;  . CERVICAL FUSION    . ENDARTERECTOMY Left 11/29/2013   Procedure: ENDARTERECTOMY CAROTID;  Surgeon: Serafina Mitchell, MD;  Location: [place];  Service: Vascular;  Laterality: Left;  . ESOPHAGOGASTRODUODENOSCOPY  02/2010   Dr. Gala Romney: patient  presented with food impaction, schatzki ring with superimposed component of stricture with erosive reflux esophagitis, s/p disimpaction but dilation planned at later date   . ESOPHAGOGASTRODUODENOSCOPY (EGD) WITH PROPOFOL N/A 08/13/2019   Dr. Gala Romney: Erosive reflux esophagitis with mild stricture and incidental Mallory-Weiss tear which precluded esophageal dilation.  Medium sized hiatal hernia.  [name] ESOPHAGOGASTRODUODENOSCOPY (EGD) WITH PROPOFOL N/A 10/11/2019   Dr. Gala Romney: Esophageal stenosis status post dilation, moderate hiatal hernia  . EYE SURGERY    . FLEXIBLE SIGMOIDOSCOPY N/A 08/13/2019   Procedure: FLEXIBLE SIGMOIDOSCOPY;  Surgeon: [name], MD;  [facility];  Service: Endoscopy;  Laterality: N/A;  colonoscopy aboerted due to formed stool and poor prep  . FOOT SURGERY    . Lower ext duplex doppler  03/14/14   Rt ABI 1.2  . LOWER EXTREMITY ANGIOGRAM Bilateral 02/18/2014   Procedure: LOWER EXTREMITY ANGIOGRAM;  Surgeon: [name], MD;  [facility];  Service: Cardiovascular;  Laterality: Bilateral;  . LOWER EXTREMITY ANGIOGRAM N/A 10/31/2014   Procedure: LOWER EXTREMITY ANGIOGRAM;  Surgeon: [name], MD;   Location: [facility] CATH LAB;  Service: Cardiovascular;  Laterality: N/A;  . Venia Minks DILATION N/A 10/11/2019   Procedure: Venia Minks DILATION;  Surgeon: [name], MD;  [facility];  Service: Endoscopy;  Laterality: N/A;  . MULTIPLE TOOTH EXTRACTIONS    . PV angiogram  02/18/2014   tibial vessel diseas bil.  [name] SPINE SURGERY    . tendon achillies lengthing and sesamoid      Family History  Problem Relation Age of Onset  . Heart disease Mother   . Hypertension Mother   . Heart attack Mother   . Hypertension Father   . Diabetes Father   . Diabetes Son   . Heart disease Son   . Hypertension Son   . Colon cancer Neg Hx     Social History   Socioeconomic History  . Marital status: Married    Spouse name: Not on file  . Number of children: Not on file  . Years of education: college  . Highest education level: Not on file  Occupational History  . Occupation: Firefighter   Tobacco Use  . Smoking status: Former Smoker    Years: 1.00    Types: Pipe    Quit date: 07/08/1977    Years since quitting: 42.6  . Smokeless tobacco: Never Used  Substance and Sexual Activity  . Alcohol use: No    Alcohol/week: 0.0 standard drinks  . Drug use: No  . Sexual activity: Not on file  Other Topics Concern  . Not on file  Social History Narrative   Drinks 3 44oz cokes a day    Social Determinants of Radio broadcast assistant Strain:   . Difficulty of Paying Living Expenses:   Food Insecurity:   . Worried About Charity fundraiser in the Last Year:   . Arboriculturist in the Last Year:   Transportation Needs:   . Film/video editor (Medical):   [name] Lack of Transportation (Non-Medical):   Physical Activity:   . Days of Exercise per Week:   . Minutes of Exercise per Session:   Stress:   . Feeling of Stress :   Social Connections:   . Frequency of Communication with Friends and Family:   . Frequency of Social Gatherings with Friends and Family:   . Attends Religious  Services:   . Active Member of Clubs or Organizations:   .  Attends Archivist Meetings:   [name] Marital Status:   Intimate Partner Violence:   . Fear of Current or Ex-Partner:   . Emotionally Abused:   [name] Physically Abused:   . Sexually Abused:       ROS:  General: Negative for anorexia, weight loss, fever, chills, fatigue, weakness. Eyes: Negative for vision changes.  ENT: Negative for hoarseness, difficulty swallowing , nasal congestion. CV: Negative for chest pain, angina, palpitations, dyspnea on exertion, peripheral edema.  Respiratory: Negative for dyspnea at rest, dyspnea on exertion, cough, sputum, wheezing.  GI: See history of present illness. GU:  Negative for dysuria, hematuria, urinary incontinence, urinary frequency, nocturnal urination.  MS: Negative for joint pain, low back pain.  Derm: Negative for rash or itching.  Neuro: Negative for weakness, abnormal sensation, seizure, frequent headaches, memory loss, confusion.  Psych: Negative for anxiety, depression, suicidal ideation, hallucinations.  Endo: Negative for unusual weight change.  Heme: Negative for bruising or bleeding. Allergy: Negative for rash or hives.   Observations/Objective:  Lab Results  Component Value Date   WBC 9.2 10/09/2019   HGB 11.5 (L) 10/09/2019   HCT 33.5 (L) 10/09/2019   MCV 97.4 10/09/2019   PLT 216 10/09/2019   Lab Results  Component Value Date   CREATININE 0.89 10/09/2019   BUN 15 10/09/2019   NA 132 (L) 10/09/2019   K 3.4 (L) 10/09/2019   CL 93 (L) 10/09/2019   CO2 29 10/09/2019   Lab Results  Component Value Date   ALT 16 05/08/2018   AST 17 05/08/2018   ALKPHOS 53 05/08/2018   BILITOT 0.8 05/08/2018     Pleasant well-nourished well-developed gentleman in no acute distress.  Color is good.  No evidence of shortness of breath.  Assessment and Plan: Pleasant 74 year old gentleman with history of positive Cologuard testing back in the summer 2020, mild stable  anemia, attempted colonoscopy with poor prep x2, presenting for follow-up.  Baseline constipation.  Difficulty affording Linzess, we have been providing samples.  Wife declined to complete patient assistance forms due to privacy issues.  Currently patient is doing well taking Linzess every 3 days.  He is interested in getting on the schedule for colonoscopy and is agreeable to inpatient bowel prep to assist with completing colonoscopy this time.  He will need deep sedation as before. We will update labs for anemia.  Patient is aware that we will plan to schedule in August when the schedule opens back up and he prefers to wait a couple of months anyway.  Constipation, taking Linzess every third day with good results.  Continue current regimen.  May call for samples as needed.  GERD, well controlled, continue pantoprazole 40 mg daily.  Follow Up Instructions:    I discussed the assessment and treatment plan with the patient. The patient was provided an opportunity to ask questions and all were answered. The patient agreed with the plan and demonstrated an understanding of the instructions. AVS mailed to patient's home address.   The patient was advised to call back or seek an in-person evaluation if the symptoms worsen or if the condition fails to improve as anticipated.  I provided 15 minutes of virtual face-to-face time during this encounter.   Neil Crouch, PA-C

## 2020-02-05 NOTE — Progress Notes (Signed)
No pcp per patient 

## 2020-02-05 NOTE — Patient Instructions (Addendum)
1. Continue Linzess every 3 days for management of constipation.  You can call for samples as needed. 2. We will call and schedule you for a colonoscopy once the August schedule is out.  We will plan for inpatient bowel prep.  You will need to be on clear liquid diet for 2 days prior to your procedure date.  You will also need to take Linzess daily for 3 days prior to your procedure date. 3. Continue pantoprazole 40 mg daily. 4. Go to the lab at your convenience, we will contact you with results as available.

## 2020-02-08 ENCOUNTER — Encounter: Payer: Self-pay | Admitting: Family Medicine

## 2020-02-08 DIAGNOSIS — E113293 Type 2 diabetes mellitus with mild nonproliferative diabetic retinopathy without macular edema, bilateral: Secondary | ICD-10-CM | POA: Diagnosis not present

## 2020-02-08 DIAGNOSIS — H52203 Unspecified astigmatism, bilateral: Secondary | ICD-10-CM | POA: Diagnosis not present

## 2020-02-08 DIAGNOSIS — Z961 Presence of intraocular lens: Secondary | ICD-10-CM | POA: Diagnosis not present

## 2020-02-08 LAB — HM DIABETES EYE EXAM

## 2020-02-13 ENCOUNTER — Encounter: Payer: Self-pay | Admitting: Podiatry

## 2020-02-13 ENCOUNTER — Ambulatory Visit: Payer: PPO | Admitting: Podiatry

## 2020-02-13 ENCOUNTER — Other Ambulatory Visit: Payer: Self-pay

## 2020-02-13 DIAGNOSIS — E1159 Type 2 diabetes mellitus with other circulatory complications: Secondary | ICD-10-CM | POA: Diagnosis not present

## 2020-02-13 DIAGNOSIS — B351 Tinea unguium: Secondary | ICD-10-CM

## 2020-02-13 DIAGNOSIS — L84 Corns and callosities: Secondary | ICD-10-CM | POA: Diagnosis not present

## 2020-02-13 NOTE — Patient Instructions (Signed)
Diabetes Mellitus and Foot Care Foot care is an important part of your health, especially when you have diabetes. Diabetes may cause you to have problems because of poor blood flow (circulation) to your feet and legs, which can cause your skin to:  Become thinner and drier.  Break more easily.  Heal more slowly.  Peel and crack. You may also have nerve damage (neuropathy) in your legs and feet, causing decreased feeling in them. This means that you may not notice minor injuries to your feet that could lead to more serious problems. Noticing and addressing any potential problems early is the best way to prevent future foot problems. How to care for your feet Foot hygiene  Wash your feet daily with warm water and mild soap. Do not use hot water. Then, pat your feet and the areas between your toes until they are completely dry. Do not soak your feet as this can dry your skin.  Trim your toenails straight across. Do not dig under them or around the cuticle. File the edges of your nails with an emery board or nail file.  Apply a moisturizing lotion or petroleum jelly to the skin on your feet and to dry, brittle toenails. Use lotion that does not contain alcohol and is unscented. Do not apply lotion between your toes. Shoes and socks  Wear clean socks or stockings every day. Make sure they are not too tight. Do not wear knee-high stockings since they may decrease blood flow to your legs.  Wear shoes that fit properly and have enough cushioning. Always look in your shoes before you put them on to be sure there are no objects inside.  To break in new shoes, wear them for just a few hours a day. This prevents injuries on your feet. Wounds, scrapes, corns, and calluses  Check your feet daily for blisters, cuts, bruises, sores, and redness. If you cannot see the bottom of your feet, use a mirror or ask someone for help.  Do not cut corns or calluses or try to remove them with medicine.  If you  find a minor scrape, cut, or break in the skin on your feet, keep it and the skin around it clean and dry. You may clean these areas with mild soap and water. Do not clean the area with peroxide, alcohol, or iodine.  If you have a wound, scrape, corn, or callus on your foot, look at it several times a day to make sure it is healing and not infected. Check for: ? Redness, swelling, or pain. ? Fluid or blood. ? Warmth. ? Pus or a bad smell. General instructions  Do not cross your legs. This may decrease blood flow to your feet.  Do not use heating pads or hot water bottles on your feet. They may burn your skin. If you have lost feeling in your feet or legs, you may not know this is happening until it is too late.  Protect your feet from hot and cold by wearing shoes, such as at the beach or on hot pavement.  Schedule a complete foot exam at least once a year (annually) or more often if you have foot problems. If you have foot problems, report any cuts, sores, or bruises to your health care provider immediately. Contact a health care provider if:  You have a medical condition that increases your risk of infection and you have any cuts, sores, or bruises on your feet.  You have an injury that is not   healing.  You have redness on your legs or feet.  You feel burning or tingling in your legs or feet.  You have pain or cramps in your legs and feet.  Your legs or feet are numb.  Your feet always feel cold.  You have pain around a toenail. Get help right away if:  You have a wound, scrape, corn, or callus on your foot and: ? You have pain, swelling, or redness that gets worse. ? You have fluid or blood coming from the wound, scrape, corn, or callus. ? Your wound, scrape, corn, or callus feels warm to the touch. ? You have pus or a bad smell coming from the wound, scrape, corn, or callus. ? You have a fever. ? You have a red line going up your leg. Summary  Check your feet every day  for cuts, sores, red spots, swelling, and blisters.  Moisturize feet and legs daily.  Wear shoes that fit properly and have enough cushioning.  If you have foot problems, report any cuts, sores, or bruises to your health care provider immediately.  Schedule a complete foot exam at least once a year (annually) or more often if you have foot problems. This information is not intended to replace advice given to you by your health care provider. Make sure you discuss any questions you have with your health care provider. Document Revised: 05/30/2019 Document Reviewed: 10/08/2016 Elsevier Patient Education  2020 [employer]  

## 2020-02-15 ENCOUNTER — Ambulatory Visit (INDEPENDENT_AMBULATORY_CARE_PROVIDER_SITE_OTHER): Payer: PPO | Admitting: Podiatry

## 2020-02-15 ENCOUNTER — Other Ambulatory Visit: Payer: Self-pay

## 2020-02-15 ENCOUNTER — Telehealth: Payer: Self-pay | Admitting: *Deleted

## 2020-02-15 VITALS — Temp 97.1°F

## 2020-02-15 DIAGNOSIS — E08621 Diabetes mellitus due to underlying condition with foot ulcer: Secondary | ICD-10-CM

## 2020-02-15 DIAGNOSIS — L97415 Non-pressure chronic ulcer of right heel and midfoot with muscle involvement without evidence of necrosis: Secondary | ICD-10-CM

## 2020-02-15 NOTE — Telephone Encounter (Signed)
-----   Message from Evelina Bucy, DPM sent at 02/15/2020  8:41 AM EDT ----- Can we refer him to Wound Care center at [facility]?

## 2020-02-15 NOTE — Progress Notes (Signed)
  Subjective:  Patient ID: [name], male    DOB: 07/06/1946,  MRN: [id number]  Chief Complaint  Patient presents with  . Diabetic Ulcer    R plantar forefoot submet 1. Pt stated, "It's doing well. 3-5/10. No pus/foul odor/fever/chills/N&V. Using Triple Antibiotic Ointment with Pain Relief".    74 y.o. male presents for wound care. Hx confirmed with patient.  Objective:  Physical Exam: Wound Location: Right 1st met 1 Wound Measurement: 2x1.5 post-debridement Wound Base: Granular/Healthy Peri-wound: Calloused Exudate: Moderate amount Serosanguinous exudate wound without warmth, erythema, signs of acute infection  Assessment:   1. Diabetic ulcer of right midfoot associated with diabetes mellitus due to underlying condition, with muscle involvement without evidence of necrosis ([place])   2. Post-operative state     Plan:  Patient was evaluated and treated and all questions answered.  Ulcer right 1st Met -Dressing applied consisting of prisma and border dressing. -Wound cleansed and debrided Procedure: Excisional Debridement of Wound Indication: Removal of non-viable soft tissue from the wound to promote healing.  Anesthesia: none Pre-Debridement Wound Measurements: 2 cm x 1 cm x 0.5 cm  Post-Debridement Wound Measurements: 2 cm x 1.5 cm x 0.5 cm  Type of Debridement: Sharp Excisional Tissue Removed: Non-viable soft tissue Instrumentation: 15 blade and tissue nipper Depth of Debridement: subcutaneous tissue. Technique: Sharp excisional debridement to bleeding, viable wound base.  Dressing: Dry, sterile, compression dressing. Disposition: Patient tolerated procedure well. Patient to return in 1 week for follow-up.    No follow-ups on file.

## 2020-02-15 NOTE — Telephone Encounter (Signed)
Faxed referral, clinicals and demographics to [facility].

## 2020-02-18 NOTE — Progress Notes (Signed)
Subjective: MARCELLE [name] presents today at risk foot care. Patient has h/o diabetic with PAD with h/o amputation of L hallux and painful mycotic nails b/l that are difficult to trim. Pain interferes with ambulation. Aggravating factors include wearing enclosed shoe gear. Pain is relieved with periodic professional debridement  He states he has not heard from the [place]. He has been applying Neosporin to right foot ulcer daily and keeping it clean. Denies any increased redness, drainage or pain. Denies any fever, chills, night sweats, nausea or vomiting.  Past Medical History:  Diagnosis Date  . Arthritis   . Cardiomyopathy- EF NL 01/2013, now 30-35% echo 03/14/2012  . Carotid artery occlusion    left s/p CEA  . CHF (congestive heart failure) ([place])   . Diabetes mellitus   . GERD (gastroesophageal reflux disease)   . HOH (hard of hearing)   . Hypertension   . Hypothyroidism   . Obstructive sleep apnea   . Orthostatic hypotension   . Osteomyelitis ([place])    left great toe  . Peripheral arterial disease ([place]), s/p PTA x 2 RLE    nonhealing ulcers bilaterally on each great toe  . Pneumonia   . PONV (postoperative nausea and vomiting)   . Restless leg syndrome   . Shortness of breath   . Stroke [facility]) March 08, 2012  . Wears dentures      Current Outpatient Medications on File Prior to Visit  Medication Sig Dispense Refill  . aspirin EC 81 MG EC tablet Take 1 tablet (81 mg total) by mouth daily. (Patient taking differently: Take 81 mg by mouth at bedtime. )    . b complex vitamins tablet Take 1 tablet by mouth daily.    . carvedilol (COREG) 3.125 MG tablet Take 1 tablet (3.125 mg total) by mouth 2 (two) times daily with a meal. 180 tablet 3  . Cholecalciferol (VITAMIN D3) 5000 units TABS Take 5,000 Units by mouth 2 (two) times daily.     . clopidogrel (PLAVIX) 75 MG tablet Take 75 mg by mouth daily.     . Continuous Blood Gluc Receiver (FREESTYLE LIBRE 14 DAY READER) DEVI      . Continuous Blood Gluc Sensor (FREESTYLE LIBRE 14 DAY SENSOR) MISC     . Cyanocobalamin ([employer]S B-12) 1500 MCG TBDP Take 1,500 mcg by mouth daily.     . DULoxetine (CYMBALTA) 60 MG capsule Take 60 mg by mouth daily.    . furosemide (LASIX) 40 MG tablet TAKE ONE TABLET (40MG  TOTAL) BY MOUTH TWO TIMES DAILY. (Patient taking differently: Take 40 mg by mouth 2 (two) times daily. ) 180 tablet 3  . glimepiride (AMARYL) 4 MG tablet Take 4 mg by mouth daily with breakfast.    . levothyroxine (SYNTHROID, LEVOTHROID) 75 MCG tablet Take 75 mcg by mouth daily before breakfast.     . linaclotide (LINZESS) 290 MCG CAPS capsule Take 290 mcg by mouth daily before breakfast.    . Multiple Vitamin (MULTIVITAMIN WITH MINERALS) TABS tablet Take 1 tablet by mouth daily.    . nitroGLYCERIN (NITROSTAT) 0.4 MG SL tablet Place 1 tablet (0.4 mg total) under the tongue every 5 (five) minutes as needed for chest pain. 25 tablet 3  . pantoprazole (PROTONIX) 40 MG tablet Take 40 mg by mouth daily.    . potassium chloride SA (K-DUR,KLOR-CON) 20 MEQ tablet Take 1 tablet (20 mEq total) by mouth daily. Please schedule appointment for refills (Patient taking differently: Take 20 mEq by  mouth every other day. Please schedule appointment for refills) 90 tablet 3  . pramipexole (MIRAPEX) 0.25 MG tablet Take 2 tablets (0.5 mg total) by mouth 2 (two) times daily. Take 0.5mg  by mouth at 7 PM and 0.5mg  at 9 PM daily. (Patient taking differently: Take 0.5 mg by mouth See admin instructions. Take 0.5 mg by mouth at 5 PM and 0.5mg  at 7 PM daily.) 360 tablet 3  . rosuvastatin (CRESTOR) 20 MG tablet Take 20 mg by mouth daily.     No current facility-administered medications on file prior to visit.     Allergies  Allergen Reactions  . Quinoline Yellow Ss [Yellow Dye #11]     Pt unsure   . Codeine Nausea And Vomiting  . Tramadol Nausea Only    Objective: [name] is a pleasant 74 y.o. y.o. Patient Race: White or Caucasian [1]   male in NAD. AAO x 3.  There were no vitals filed for this visit.  Vascular Examination: Neurovascular status unchanged b/l lower extremities. Capillary refill time to digits immediate b/l. Palpable DP pulses b/l. Nonpalpable PT pulse(s) b/l lower extremities. Pedal hair absent b/l Skin temperature gradient within normal limits b/l.  Dermatological Examination: Pedal skin is thin shiny, atrophic bilaterally. No open wounds bilaterally. No interdigital macerations bilaterally. Toenails 2-5 bilaterally and R hallux elongated, discolored, dystrophic, thickened, and crumbly with subungual debris and tenderness to dorsal palpation. Hyperkeratotic lesion(s) distal stump L hallux.  No erythema, no edema, no drainage, no flocculence.   Wound Location: submet head 1 right foot There is moderate amount of non-viable hyperkeratoti tissue present in the wound. Predebridement Wound Measurement: 2.0 x 2.0 x 0.3 cm. Postdebridement Wound Measurement: Patient not debrided.   Wound Base: Granular/Healthy Peri-wound: Calloused Exudate: None: wound tissue dry Blood Loss during debridement: less than 0 cc's. Description of tissue removed from ulceration today:  None; not debrided Signs of clinical bacterial infection are absent. Material in wound which inhibits healing/promotes adjacent tissue breakdown: Calloused periphery.  Musculoskeletal: Normal muscle strength 5/5 to all lower extremity muscle groups bilaterally. No pain crepitus or joint limitation noted with ROM b/l. No gross bony deformities bilaterally. Lower extremity amputation(s): digital amputation L hallux.  Neurological Examination: Protective sensation intact 5/5 intact bilaterally with 10g monofilament b/l. Vibratory sensation intact b/l.  Assessment: 1. Onychomycosis   2. Callus   3. Type 2 diabetes mellitus with other circulatory complication, without long-term current use of insulin ([place])   Plan: -Examined patient. -Right foot ulcer  cleansed and new dressing applied. Continue previous wound care instructed. Will have patient see Dr. March Rummage on Friday for evaluation of ulcer right foot. -No new findings. No new orders. -Toenails 2-5 bilaterally and R hallux debrided in length and girth without iatrogenic bleeding with sterile nail nipper and dremel.  -Callus(es) pared distal stump left hallux utilizing sterile scalpel blade without incident. -Patient to continue soft, supportive shoe gear daily. -Patient to report any pedal injuries to medical professional immediately. -Patient/POA to call should there be question/concern in the interim.  Return in about 2 days (around 02/15/2020) for diabetic ulcer right.  Marzetta Board, DPM

## 2020-02-19 ENCOUNTER — Ambulatory Visit (HOSPITAL_COMMUNITY): Payer: PPO | Attending: Podiatry | Admitting: Physical Therapy

## 2020-02-19 ENCOUNTER — Other Ambulatory Visit: Payer: Self-pay | Admitting: Neurology

## 2020-02-19 ENCOUNTER — Encounter (HOSPITAL_COMMUNITY): Payer: Self-pay | Admitting: Physical Therapy

## 2020-02-19 ENCOUNTER — Other Ambulatory Visit: Payer: Self-pay

## 2020-02-19 DIAGNOSIS — G4761 Periodic limb movement disorder: Secondary | ICD-10-CM

## 2020-02-19 DIAGNOSIS — L97411 Non-pressure chronic ulcer of right heel and midfoot limited to breakdown of skin: Secondary | ICD-10-CM | POA: Diagnosis not present

## 2020-02-19 DIAGNOSIS — G2581 Restless legs syndrome: Secondary | ICD-10-CM

## 2020-02-19 DIAGNOSIS — E08621 Diabetes mellitus due to underlying condition with foot ulcer: Secondary | ICD-10-CM | POA: Diagnosis not present

## 2020-02-19 DIAGNOSIS — R262 Difficulty in walking, not elsewhere classified: Secondary | ICD-10-CM

## 2020-02-19 NOTE — Addendum Note (Signed)
Addended by: [name] R on: 02/19/2020 04:17 PM   Modules accepted: Orders

## 2020-02-19 NOTE — Therapy (Addendum)
[place], [place], [address] Phone: [phone]   Fax:  [phone]  Wound Care Evaluation  Patient Details  Name: [name] MRN: [id number] Date of Birth: [birth year] Referring Provider (PT): Hardie Pulley, DPM   Encounter Date: 02/19/2020  PT End of Session - 02/19/20 1536    Visit Number  1    Number of Visits  16    Date for PT Re-Evaluation  04/15/20    Authorization Type  [employer] no VL no auth    Progress Note Due on Visit  10    PT Start Time  1300    PT Stop Time  1400    PT Time Calculation (min)  60 min    Activity Tolerance  Patient tolerated treatment well;No increased pain       Past Medical History:  Diagnosis Date  . Arthritis   . Cardiomyopathy- EF NL 01/2013, now 30-35% echo 03/14/2012  . Carotid artery occlusion    left s/p CEA  . CHF (congestive heart failure) ([place])   . Diabetes mellitus   . GERD (gastroesophageal reflux disease)   . HOH (hard of hearing)   . Hypertension   . Hypothyroidism   . Obstructive sleep apnea   . Orthostatic hypotension   . Osteomyelitis ([place])    left great toe  . Peripheral arterial disease ([place]), s/p PTA x 2 RLE    nonhealing ulcers bilaterally on each great toe  . Pneumonia   . PONV (postoperative nausea and vomiting)   . Restless leg syndrome   . Shortness of breath   . Stroke [facility]) March 08, 2012  . Wears dentures     Past Surgical History:  Procedure Laterality Date  . AMPUTATION TOE Left 07/25/2018   Procedure: AMPUTATION TOE INTERPHALANGEAL HALLUX LEFT;  Surgeon: Evelina Bucy, DPM;  Location: [place];  Service: Podiatry;  Laterality: Left;  . ANGIOPLASTY  02/28/14   diamond back orbital rotational atherectomy of Rt. tibial  . BACK SURGERY    . BONE BIOPSY Left 07/25/2018   Procedure: SUPERFICIAL BONE BIOPSY;  Surgeon: Evelina Bucy, DPM;  Location: [place];  Service: Podiatry;  Laterality: Left;  . CARDIAC CATHETERIZATION  N/A 05/19/2016   Procedure: Right/Left Heart Cath and Coronary Angiography;  Surgeon: Belva Crome, MD;  Location: [place] CV LAB;  Service: Cardiovascular;  Laterality: N/A;  . CERVICAL FUSION    . ENDARTERECTOMY Left 11/29/2013   Procedure: ENDARTERECTOMY CAROTID;  Surgeon: Serafina Mitchell, MD;  Location: [facility] OR;  Service: Vascular;  Laterality: Left;  . ESOPHAGOGASTRODUODENOSCOPY  02/2010   Dr. Gala Romney: patient presented with food impaction, schatzki ring with superimposed component of stricture with erosive reflux esophagitis, s/p disimpaction but dilation planned at later date   . ESOPHAGOGASTRODUODENOSCOPY (EGD) WITH PROPOFOL N/A 08/13/2019   Dr. Gala Romney: Erosive reflux esophagitis with mild stricture and incidental Mallory-Weiss tear which precluded esophageal dilation.  Medium sized hiatal hernia.  [name] ESOPHAGOGASTRODUODENOSCOPY (EGD) WITH PROPOFOL N/A 10/11/2019   Dr. Gala Romney: Esophageal stenosis status post dilation, moderate hiatal hernia  . EYE SURGERY    . FLEXIBLE SIGMOIDOSCOPY N/A 08/13/2019   Procedure: FLEXIBLE SIGMOIDOSCOPY;  Surgeon: [name], MD;  [facility];  Service: Endoscopy;  Laterality: N/A;  colonoscopy aboerted due to formed stool and poor prep  . FOOT SURGERY    . Lower ext duplex doppler  03/14/14   Rt ABI 1.2  . LOWER EXTREMITY ANGIOGRAM  Bilateral 02/18/2014   Procedure: LOWER EXTREMITY ANGIOGRAM;  Surgeon: [name], MD;  Location: [facility] CATH LAB;  Service: Cardiovascular;  Laterality: Bilateral;  . LOWER EXTREMITY ANGIOGRAM N/A 10/31/2014   Procedure: LOWER EXTREMITY ANGIOGRAM;  Surgeon: [name], MD;  Location: [facility] CATH LAB;  Service: Cardiovascular;  Laterality: N/A;  . Venia Minks DILATION N/A 10/11/2019   Procedure: Venia Minks DILATION;  Surgeon: [name], MD;  [facility];  Service: Endoscopy;  Laterality: N/A;  . MULTIPLE TOOTH EXTRACTIONS    . PV angiogram  02/18/2014   tibial vessel diseas bil.  [name] SPINE SURGERY    .  tendon achillies lengthing and sesamoid      There were no vitals filed for this visit.    [facility] PT Assessment - 02/19/20 0001      Assessment   Medical Diagnosis  R midfoot diabetic ulcer    Referring Provider (PT)  Hardie Pulley, DPM      Wound Therapy - 02/19/20 1545    Subjective  Patient presents to therapy with wound on base of right foot that has been present there for 3-4 years per patient. He has had treatment at a couple of different clinics and has been seen by the MD about 1x/every 2 weeks but the MD would like him to be seen more regularly at wound clinic. Patient reports he has been having trouble getting around and had a fall about 2 weeks ago when the sole of his diabetic shoe came undone and he flew. Since then his left hip has really started to bother him and he has not followed up with and MD yet. States he uses a cane because a walker would be too challenging to navigate in his home as he has multiple flights of stairs. States he has pain sometimes in the wound and around the wound. Reports he also has restless legs that sometimes results in full body shakes.     Patient and Family Stated Goals  to have wound healed    Date of Onset  --   3-4 years ago   Prior Treatments  wound therapy at 3 different clinics, debridement by MD.     Pain Scale  0-10    Pain Score  3     Pain Type  Chronic pain    Pain Location  Foot    Pain Orientation  Right   along metatarsal s   Pain Descriptors / Indicators  Aching    Pain Onset  With Activity   and palpation    Patients Stated Pain Goal  0    Pain Intervention(s)  Repositioned    Evaluation and Treatment Procedures Explained to Patient/Family  Yes    Evaluation and Treatment Procedures  agreed to    Wound Properties Date First Assessed: 02/19/20 Time First Assessed: 1300 Wound Type: Diabetic ulcer Location: Foot Location Orientation: Right;Other (Comment) Wound Description (Comments): plantar aspect at head of first metatarsal   Present on Admission: Yes   Dressing Type  Impregnated gauze (bismuth);Compression wrap    Dressing Changed  New    Dressing Status  Dry    Dressing Change Frequency  PRN    Site / Wound Assessment  Pink;Red;Dry;Dusky    % Wound base Red or Granulating  75%    % Wound base Other/Granulation Tissue (Comment)  25%    Peri-wound Assessment  Intact   callus surrounding wound bed   Wound Length (cm)  1.8 cm  Wound Width (cm)  1.4 cm    Wound Depth (cm)  0.6 cm    Wound Volume (cm^3)  1.51 cm^3    Wound Surface Area (cm^2)  2.52 cm^2    Undermining (cm)  from 9 to 3 o 'clock    Margins  Unattached edges (unapproximated)    Drainage Amount  None    Treatment  Cleansed;Debridement (Selective);Packing (Impregnated strip)    Selective Debridement - Location  plantar aspect of R foot    Selective Debridement - Tools Used  Scalpel;Forceps    Selective Debridement - Tissue Removed  devitalized tissue, callus    Wound Therapy - Clinical Statement  Patient presents to clinic with non healing wound on the base of his right plantar foot. Wound has been present for 3-4 years with previous treatment at wound clinic. Patient has used a diabetic shoe that offloaded this area up until 2 weeks ago when the sole of the shoe came lose and he fell hurting his left hip. Debrided callus that is surrounding wound bed and educated patient in off loading foot along with following up with MD about left hip pain that limited his ability to ambulate in and out of the clinic today. Swelling noted in right lower leg so added light compression to right LE and will determine how patient tolerated this next session. Patient would greatly benefit from skilled physical therapy to improve and promote optimal wound healing to prevent wound infection and improve patient's functional mobility. Wound healing is compromised and would benefit from wound care secondary to patient's comorbidities of peripheral arterial disease and diabetes.      Wound Therapy - Functional Problem List  difficulty walking, bathing transfering     Factors Delaying/Impairing Wound Healing  Diabetes Mellitus;Vascular compromise;Other (comment)   PAD   Wound Therapy - Frequency  2X / week    Wound Therapy - Current Recommendations  PT    Wound Plan  debride callus, pack wound with xeroform, 2x2 then profore lite for light compression     Dressing   profore lite, xeroform, 2x2             Objective measurements completed on examination: See above findings.            PT Education - 02/19/20 1613    Education Details  in current condition, in wound care, in compression and how to remove layers of banadage if needed due to discomfort    Person(s) Educated  Patient    Methods  Explanation    Comprehension  Verbalized understanding       PT Short Term Goals - 02/19/20 1540      PT SHORT TERM GOAL #1   Title  Wound will measure no more than 2.0 cm^2  to demeonstrate decreased wound size    Time  4    Period  Weeks    Status  New    Target Date  03/18/20      PT SHORT TERM GOAL #2   Title  Paitent will consistently wear diabetic shoes to decrease pressure on plantar surface of wound to allow it to heal.    Time  4    Period  Weeks    Status  New    Target Date  03/18/20        PT Long Term Goals - 02/19/20 1541      PT LONG TERM GOAL #1   Time  --    Period  --  Status  --    Target Date  --      PT LONG TERM GOAL #2   Title  Patient will be independent in self care techniques in regards to skin care and foot inspecation to decrease risk of additional wounds    Time  8    Period  Weeks    Status  New    Target Date  04/15/20      PT LONG TERM GOAL #3   Title  Wound will be completely healed to improve ability to ambulate    Time  8    Period  Weeks    Target Date  04/15/20           Plan - 02/19/20 1538    Personal Factors and Comorbidities  Age;Comorbidity 1;Comorbidity 3+    Comorbidities  CHF,  PAD, DB    Examination-Activity Limitations  Locomotion Level;Squat;Stairs;Stand    Stability/Clinical Decision Making  Evolving/Moderate complexity    Clinical Decision Making  Moderate    Rehab Potential  Fair    PT Frequency  2x / week    PT Duration  8 weeks    PT Treatment/Interventions  ADLs/Self Care Home Management;Manual techniques;Orthotic Fit/Training;Patient/family education;Gait training;Therapeutic exercise    PT Next Visit Plan  continue with wound care plan    Recommended Other Services  f/u with MD about left hip pain    Consulted and Agree with Plan of Care  Patient       Patient will benefit from skilled therapeutic intervention in order to improve the following deficits and impairments:  Pain, Decreased range of motion, Decreased skin integrity, Decreased endurance, Increased edema, Decreased knowledge of precautions, Decreased activity tolerance, Decreased balance, Difficulty walking  Visit Diagnosis: Difficulty in walking, not elsewhere classified  Diabetic ulcer of right midfoot associated with diabetes mellitus due to underlying condition, limited to breakdown of skin [facility])    Problem List Patient Active Problem List   Diagnosis Date Noted  . Anemia 09/03/2019  . Reflux esophagitis 09/03/2019  . Esophageal dysphagia 05/30/2019  . Constipation 05/30/2019  . Positive colorectal cancer screening using Cologuard test 05/30/2019  . Diabetic foot ulcer ([place]) 09/06/2018  . Toe osteomyelitis, left ([place])   . Ulcer of great toe, left, with necrosis of bone ([place])   . Cellulitis of left foot 05/07/2018  . Orthostatic hypotension 08/25/2016  . Arrhythmia 05/20/2016  . Angina decubitus ([place]) 05/19/2016  . Congestive heart failure ([place]) 05/17/2016  . Pain in the chest 05/17/2016  . Pulmonary nodule 05/17/2016  . Elevated troponin 04/11/2016  . Nonhealing skin ulcer ([place]) 10/28/2014  . Bilateral carotid artery disease ([place]) 07/08/2014  . Critical lower limb  ischemia 02/28/2014  . Obstructive sleep apnea 02/12/2014  . Aftercare following surgery of the circulatory system, [place] 12/04/2013  . Restless leg syndrome 10/23/2013  . Obesity (BMI 30.0-34.9) 10/23/2013  . Hyperlipidemia 07/16/2013  . Peripheral arterial disease ([place]) 07/16/2013  . History of stroke June 2013 03/14/2012  . Hypertension 03/14/2012  . Type 2 diabetes mellitus with circulatory disorder ([place]) 03/14/2012  . Peripheral neuropathy 03/14/2012  . Cardiomyopathy- EF NL 01/2013, now 30-35% echo 03/14/2012  . ARTHRITIS, RIGHT FOOT 06/26/2008   4:15 PM, 02/19/20 [name], DPT Physical Therapy with [facility]  [phone] office  [place] [address] [place], [place], [address] Phone: [phone]   Fax:  [phone]  Name: [name] MRN: [id number] Date of Birth: [birth year]

## 2020-02-21 ENCOUNTER — Ambulatory Visit (HOSPITAL_COMMUNITY): Payer: PPO | Admitting: Physical Therapy

## 2020-02-21 ENCOUNTER — Other Ambulatory Visit: Payer: Self-pay

## 2020-02-21 DIAGNOSIS — R262 Difficulty in walking, not elsewhere classified: Secondary | ICD-10-CM | POA: Diagnosis not present

## 2020-02-21 DIAGNOSIS — E08621 Diabetes mellitus due to underlying condition with foot ulcer: Secondary | ICD-10-CM

## 2020-02-21 DIAGNOSIS — L97411 Non-pressure chronic ulcer of right heel and midfoot limited to breakdown of skin: Secondary | ICD-10-CM

## 2020-02-21 NOTE — Therapy (Signed)
[place], [place], [address] Phone: [phone]   Fax:  [phone]  Wound Care Therapy  Patient Details  Name: [name] MRN: [id number] Date of Birth: July 30, 1946 Referring Provider (PT): Hardie Pulley, DPM   Encounter Date: 02/21/2020  PT End of Session - 02/21/20 1731    Visit Number  2    Number of Visits  16    Date for PT Re-Evaluation  04/15/20    Authorization Type  [employer]alt[employer] no VL no auth    Progress Note Due on Visit  10    PT Start Time  1630    PT Stop Time  1710    PT Time Calculation (min)  40 min    Activity Tolerance  Patient tolerated treatment well;No increased pain       Past Medical History:  Diagnosis Date  . Arthritis   . Cardiomyopathy- EF NL 01/2013, now 30-35% echo 03/14/2012  . Carotid artery occlusion    left s/p CEA  . CHF (congestive heart failure) ([place])   . Diabetes mellitus   . GERD (gastroesophageal reflux disease)   . HOH (hard of hearing)   . Hypertension   . Hypothyroidism   . Obstructive sleep apnea   . Orthostatic hypotension   . Osteomyelitis ([place])    left great toe  . Peripheral arterial disease ([place]), s/p PTA x 2 RLE    nonhealing ulcers bilaterally on each great toe  . Pneumonia   . PONV (postoperative nausea and vomiting)   . Restless leg syndrome   . Shortness of breath   . Stroke [facility]) March 08, 2012  . Wears dentures     Past Surgical History:  Procedure Laterality Date  . AMPUTATION TOE Left 07/25/2018   Procedure: AMPUTATION TOE INTERPHALANGEAL HALLUX LEFT;  Surgeon: Evelina Bucy, DPM;  Location: [place];  Service: Podiatry;  Laterality: Left;  . ANGIOPLASTY  02/28/14   diamond back orbital rotational atherectomy of Rt. tibial  . BACK SURGERY    . BONE BIOPSY Left 07/25/2018   Procedure: SUPERFICIAL BONE BIOPSY;  Surgeon: Evelina Bucy, DPM;  Location: [place];  Service: Podiatry;  Laterality: Left;  . CARDIAC CATHETERIZATION N/A  05/19/2016   Procedure: Right/Left Heart Cath and Coronary Angiography;  Surgeon: Belva Crome, MD;  Location: [place] CV LAB;  Service: Cardiovascular;  Laterality: N/A;  . CERVICAL FUSION    . ENDARTERECTOMY Left 11/29/2013   Procedure: ENDARTERECTOMY CAROTID;  Surgeon: Serafina Mitchell, MD;  Location: [facility] OR;  Service: Vascular;  Laterality: Left;  . ESOPHAGOGASTRODUODENOSCOPY  02/2010   Dr. Gala Romney: patient presented with food impaction, schatzki ring with superimposed component of stricture with erosive reflux esophagitis, s/p disimpaction but dilation planned at later date   . ESOPHAGOGASTRODUODENOSCOPY (EGD) WITH PROPOFOL N/A 08/13/2019   Dr. Gala Romney: Erosive reflux esophagitis with mild stricture and incidental Mallory-Weiss tear which precluded esophageal dilation.  Medium sized hiatal hernia.  [name] ESOPHAGOGASTRODUODENOSCOPY (EGD) WITH PROPOFOL N/A 10/11/2019   Dr. Gala Romney: Esophageal stenosis status post dilation, moderate hiatal hernia  . EYE SURGERY    . FLEXIBLE SIGMOIDOSCOPY N/A 08/13/2019   Procedure: FLEXIBLE SIGMOIDOSCOPY;  Surgeon: [name], MD;  [facility];  Service: Endoscopy;  Laterality: N/A;  colonoscopy aboerted due to formed stool and poor prep  . FOOT SURGERY    . Lower ext duplex doppler  03/14/14   Rt ABI 1.2  . LOWER EXTREMITY ANGIOGRAM  Bilateral 02/18/2014   Procedure: LOWER EXTREMITY ANGIOGRAM;  Surgeon: [name], MD;  Location: [facility] CATH LAB;  Service: Cardiovascular;  Laterality: Bilateral;  . LOWER EXTREMITY ANGIOGRAM N/A 10/31/2014   Procedure: LOWER EXTREMITY ANGIOGRAM;  Surgeon: [name], MD;  Location: [facility] C[facility] LAB;  Service: Cardiovascular;  Laterality: N/A;  . Venia Minks DILATION N/A 10/11/2019   Procedure: Venia Minks DILATION;  Surgeon: [name], MD;  [facility];  Service: Endoscopy;  Laterality: N/A;  . MULTIPLE TOOTH EXTRACTIONS    . PV angiogram  02/18/2014   tibial vessel diseas bil.  [name] SPINE SURGERY    . tendon  achillies lengthing and sesamoid      There were no vitals filed for this visit.              Wound Therapy - 02/21/20 1726    Subjective  Pt returns reporting the dressing is still intact. No reports of pain or issues this session.    Patient and Family Stated Goals  to have wound healed    Date of Onset  --   3-4 years ago   Prior Treatments  wound therapy at 3 different clinics, debridement by MD.     Evaluation and Treatment Procedures Explained to Patient/Family  Yes    Evaluation and Treatment Procedures  agreed to    Wound Properties Date First Assessed: 02/19/20 Time First Assessed: 1300 Wound Type: Diabetic ulcer Location: Foot Location Orientation: Right;Other (Comment) Wound Description (Comments): plantar aspect at head of first metatarsal  Present on Admission: Yes   Dressing Type  Impregnated gauze (bismuth);Compression wrap    Dressing Changed  Changed    Dressing Status  Dry;Old drainage    Dressing Change Frequency  PRN    Site / Wound Assessment  Pink;Red;Dry;Dusky    % Wound base Red or Granulating  75%    % Wound base Other/Granulation Tissue (Comment)  25%    Peri-wound Assessment  Intact   callus surrounding wound bed   Margins  Unattached edges (unapproximated)    Drainage Amount  None    Treatment  Cleansed;Debridement (Selective)    Selective Debridement - Location  plantar aspect of R foot    Selective Debridement - Tools Used  Scalpel;Forceps    Selective Debridement - Tissue Removed  devitalized tissue, callus    Wound Therapy - Clinical Statement  Pt returns this session with profore intact, however had slid half way down his LE.  Discussed compression socks, which patient was agreeable to.  Therapist measured his LE's and was given pamphlet and instructions for ordering from [facility] in [place].  Very minimal swelling in Lt LE today and none in Rt.  Cleansed plantar sufrace of Rt LE well and debrided callouse and edges prior to  redressing.  Used medipore tape to secure this sessio and did not reassply the profore.  Discussed need for walker rather than cane as pt is very unstable and slow with ambulation.  Pt is not aggreeable to using a walker at this time.     Wound Therapy - Functional Problem List  difficulty walking, bathing transfering     Factors Delaying/Impairing Wound Healing  Diabetes Mellitus;Vascular compromise;Other (comment)   PAD   Wound Therapy - Frequency  2X / week    Wound Therapy - Current Recommendations  PT    Wound Plan  continue with woundcare using appropriate dressings to promote healing.    Dressing   xeroform, 2x2, medipore  PT Short Term Goals - 02/19/20 1540      PT SHORT TERM GOAL #1   Title  Wound will measure no more than 2.0 cm^2  to demeonstrate decreased wound size    Time  4    Period  Weeks    Status  New    Target Date  03/18/20      PT SHORT TERM GOAL #2   Title  Paitent will consistently wear diabetic shoes to decrease pressure on plantar surface of wound to allow it to heal.    Time  4    Period  Weeks    Status  New    Target Date  03/18/20        PT Long Term Goals - 02/19/20 1541      PT LONG TERM GOAL #1   Time  --    Period  --    Status  --    Target Date  --      PT LONG TERM GOAL #2   Title  Patient will be independent in self care techniques in regards to skin care and foot inspecation to decrease risk of additional wounds    Time  8    Period  Weeks    Status  New    Target Date  04/15/20      PT LONG TERM GOAL #3   Title  Wound will be completely healed to improve ability to ambulate    Time  8    Period  Weeks    Target Date  04/15/20              Patient will benefit from skilled therapeutic intervention in order to improve the following deficits and impairments:     Visit Diagnosis: Difficulty in walking, not elsewhere classified  Diabetic ulcer of right midfoot associated with diabetes mellitus  due to underlying condition, limited to breakdown of skin [facility])     Problem List Patient Active Problem List   Diagnosis Date Noted  . Anemia 09/03/2019  . Reflux esophagitis 09/03/2019  . Esophageal dysphagia 05/30/2019  . Constipation 05/30/2019  . Positive colorectal cancer screening using Cologuard test 05/30/2019  . Diabetic foot ulcer ([place]) 09/06/2018  . Toe osteomyelitis, left ([place])   . Ulcer of great toe, left, with necrosis of bone ([place])   . Cellulitis of left foot 05/07/2018  . Orthostatic hypotension 08/25/2016  . Arrhythmia 05/20/2016  . Angina decubitus ([place]) 05/19/2016  . Congestive heart failure ([place]) 05/17/2016  . Pain in the chest 05/17/2016  . Pulmonary nodule 05/17/2016  . Elevated troponin 04/11/2016  . Nonhealing skin ulcer ([place]) 10/28/2014  . Bilateral carotid artery disease ([place]) 07/08/2014  . Critical lower limb ischemia 02/28/2014  . Obstructive sleep apnea 02/12/2014  . Aftercare following surgery of the circulatory system, [place] 12/04/2013  . Restless leg syndrome 10/23/2013  . Obesity (BMI 30.0-34.9) 10/23/2013  . Hyperlipidemia 07/16/2013  . Peripheral arterial disease ([place]) 07/16/2013  . History of stroke June 2013 03/14/2012  . Hypertension 03/14/2012  . Type 2 diabetes mellitus with circulatory disorder ([place]) 03/14/2012  . Peripheral neuropathy 03/14/2012  . Cardiomyopathy- EF NL 01/2013, now 30-35% echo 03/14/2012  . ARTHRITIS, RIGHT FOOT 06/26/2008   Teena Irani, PTA/CLT [phone]  Teena Irani 02/21/2020, 5:32 PM  [place] [address] [place], [place], [address] Phone: [phone]   Fax:  [phone]  Name: [name] MRN: [id number] Date of Birth:  07/05/1946    

## 2020-02-25 ENCOUNTER — Encounter (HOSPITAL_COMMUNITY): Payer: Self-pay | Admitting: *Deleted

## 2020-02-25 ENCOUNTER — Other Ambulatory Visit: Payer: Self-pay

## 2020-02-25 ENCOUNTER — Emergency Department (HOSPITAL_COMMUNITY): Payer: PPO

## 2020-02-25 ENCOUNTER — Emergency Department (HOSPITAL_COMMUNITY)
Admission: EM | Admit: 2020-02-25 | Discharge: 2020-02-25 | Disposition: A | Discharge status: Home / Self Care | Payer: PPO | Attending: Emergency Medicine | Admitting: Emergency Medicine

## 2020-02-25 ENCOUNTER — Ambulatory Visit: Payer: PPO | Admitting: Adult Health

## 2020-02-25 ENCOUNTER — Encounter: Payer: Self-pay | Admitting: Adult Health

## 2020-02-25 DIAGNOSIS — I509 Heart failure, unspecified: Secondary | ICD-10-CM | POA: Diagnosis not present

## 2020-02-25 DIAGNOSIS — I110 Hypertensive heart disease with heart failure: Secondary | ICD-10-CM | POA: Insufficient documentation

## 2020-02-25 DIAGNOSIS — Z87891 Personal history of nicotine dependence: Secondary | ICD-10-CM | POA: Diagnosis not present

## 2020-02-25 DIAGNOSIS — E119 Type 2 diabetes mellitus without complications: Secondary | ICD-10-CM | POA: Insufficient documentation

## 2020-02-25 DIAGNOSIS — S79912A Unspecified injury of left hip, initial encounter: Secondary | ICD-10-CM | POA: Diagnosis not present

## 2020-02-25 DIAGNOSIS — M25552 Pain in left hip: Secondary | ICD-10-CM | POA: Diagnosis not present

## 2020-02-25 DIAGNOSIS — G4761 Periodic limb movement disorder: Secondary | ICD-10-CM | POA: Diagnosis not present

## 2020-02-25 DIAGNOSIS — E039 Hypothyroidism, unspecified: Secondary | ICD-10-CM | POA: Insufficient documentation

## 2020-02-25 DIAGNOSIS — W19XXXA Unspecified fall, initial encounter: Secondary | ICD-10-CM

## 2020-02-25 DIAGNOSIS — Z8673 Personal history of transient ischemic attack (TIA), and cerebral infarction without residual deficits: Secondary | ICD-10-CM | POA: Diagnosis not present

## 2020-02-25 DIAGNOSIS — G2581 Restless legs syndrome: Secondary | ICD-10-CM

## 2020-02-25 DIAGNOSIS — Z885 Allergy status to narcotic agent status: Secondary | ICD-10-CM | POA: Insufficient documentation

## 2020-02-25 MED ORDER — DICLOFENAC SODIUM 1 % EX GEL: 2.0000 g | Freq: Four times a day (QID) | CUTANEOUS | 0 refills | Status: DC | PRN

## 2020-02-25 MED ORDER — PRAMIPEXOLE DIHYDROCHLORIDE 0.25 MG PO TABS: 0.5000 mg | ORAL_TABLET | Freq: Two times a day (BID) | ORAL | 3 refills | Status: DC

## 2020-02-25 NOTE — Progress Notes (Addendum)
PATIENT: [name] DOB: 11/24/1945  REASON FOR VISIT: follow up HISTORY FROM: patient  HISTORY OF PRESENT ILLNESS: Today 02/25/20: [name] is a 74 year old male with a history of restless leg syndrome.  He returns today for follow-up.  He reports that he has been out of his Mirapex since Wednesday.  Reports that when he takes the medication it controls his symptoms.  Reports that he is does sleep better with the medication.  He has been on CPAP in the past but stopped using the machine.  Reports that he does have daytime sleepiness.  Patient also reports that approximately 2 weeks ago he fell on his left hip.  Reports that he has had some pain in the hip as well as difficulty lifting the left leg.  He has not followed up with his PCP.   HISTORY He reports feeling stable for the most part. Takes Mirapex 2 times in the evening, at 7 PM and 9 PM, 0.5 mg each time. He continues to have lower extremity swelling which fluctuates, he takes Lasix generic 40 mg twice a day. He does take Cymbalta in the evening. His partial left big toe amputation healed well. His restless leg symptoms fluctuate, definitely tend to be worse in the evening and 2 times out of the month he has a bad night, otherwise generally speaking he would consider himself stable in that regard. He does try to hydrate well with water. He drinks caffeine in the form of coffee, he cut back. He drinks 3 cups of coffee daily and typically no daily soda and no tea. He tries to stay active physically, some yard work, tries to walk, is planning to walk more regularly now.  REVIEW OF SYSTEMS: Out of a complete 14 system review of symptoms, the patient complains only of the following symptoms, and all other reviewed systems are negative.  See HPI  ALLERGIES: Allergies  Allergen Reactions  . Quinoline Yellow Ss [Yellow Dye #11]     Pt unsure   . Codeine Nausea And Vomiting  . Tramadol Nausea Only    HOME  MEDICATIONS: Outpatient Medications Prior to Visit  Medication Sig Dispense Refill  . aspirin EC 81 MG EC tablet Take 1 tablet (81 mg total) by mouth daily. (Patient taking differently: Take 81 mg by mouth at bedtime. )    . b complex vitamins tablet Take 1 tablet by mouth daily.    . carvedilol (COREG) 3.125 MG tablet Take 1 tablet (3.125 mg total) by mouth 2 (two) times daily with a meal. 180 tablet 3  . Cholecalciferol (VITAMIN D3) 5000 units TABS Take 5,000 Units by mouth 2 (two) times daily.     . clopidogrel (PLAVIX) 75 MG tablet Take 75 mg by mouth daily.     . Continuous Blood Gluc Receiver (FREESTYLE LIBRE 14 DAY READER) DEVI     . Continuous Blood Gluc Sensor (FREESTYLE LIBRE 14 DAY SENSOR) MISC     . Cyanocobalamin ([employer] B-12) 1500 MCG TBDP Take 1,500 mcg by mouth daily.     . DULoxetine (CYMBALTA) 60 MG capsule Take 60 mg by mouth daily.    . furosemide (LASIX) 40 MG tablet TAKE ONE TABLET (40MG  TOTAL) BY MOUTH TWO TIMES DAILY. (Patient taking differently: Take 40 mg by mouth 2 (two) times daily. ) 180 tablet 3  . glimepiride (AMARYL) 4 MG tablet Take 4 mg by mouth daily with breakfast.    . levothyroxine (SYNTHROID, LEVOTHROID) 75 MCG tablet Take 75  mcg by mouth daily before breakfast.     . linaclotide (LINZESS) 290 MCG CAPS capsule Take 290 mcg by mouth daily before breakfast.    . Multiple Vitamin (MULTIVITAMIN WITH MINERALS) TABS tablet Take 1 tablet by mouth daily.    . nitroGLYCERIN (NITROSTAT) 0.4 MG SL tablet Place 1 tablet (0.4 mg total) under the tongue every 5 (five) minutes as needed for chest pain. 25 tablet 3  . pantoprazole (PROTONIX) 40 MG tablet Take 40 mg by mouth daily.    . potassium chloride SA (K-DUR,KLOR-CON) 20 MEQ tablet Take 1 tablet (20 mEq total) by mouth daily. Please schedule appointment for refills (Patient taking differently: Take 20 mEq by mouth every other day. Please schedule appointment for refills) 90 tablet 3  . pramipexole (MIRAPEX) 0.25 MG  tablet Take 2 tablets (0.5 mg total) by mouth 2 (two) times daily. Take 0.5mg  by mouth at 7 PM and 0.5mg  at 9 PM daily. (Patient taking differently: Take 0.5 mg by mouth See admin instructions. Take 0.5 mg by mouth at 5 PM and 0.5mg  at 7 PM daily.) 360 tablet 3  . rosuvastatin (CRESTOR) 20 MG tablet Take 20 mg by mouth daily.     No facility-administered medications prior to visit.    PAST MEDICAL HISTORY: Past Medical History:  Diagnosis Date  . Arthritis   . Cardiomyopathy- EF NL 01/2013, now 30-35% echo 03/14/2012  . Carotid artery occlusion    left s/p CEA  . CHF (congestive heart failure) ([place])   . Diabetes mellitus   . GERD (gastroesophageal reflux disease)   . HOH (hard of hearing)   . Hypertension   . Hypothyroidism   . Obstructive sleep apnea   . Orthostatic hypotension   . Osteomyelitis ([place])    left great toe  . Peripheral arterial disease ([place]), s/p PTA x 2 RLE    nonhealing ulcers bilaterally on each great toe  . Pneumonia   . PONV (postoperative nausea and vomiting)   . Restless leg syndrome   . Shortness of breath   . Stroke [facility]) March 08, 2012  . Wears dentures     PAST SURGICAL HISTORY: Past Surgical History:  Procedure Laterality Date  . AMPUTATION TOE Left 07/25/2018   Procedure: AMPUTATION TOE INTERPHALANGEAL HALLUX LEFT;  Surgeon: Evelina Bucy, DPM;  Location: [place];  Service: Podiatry;  Laterality: Left;  . ANGIOPLASTY  02/28/14   diamond back orbital rotational atherectomy of Rt. tibial  . BACK SURGERY    . BONE BIOPSY Left 07/25/2018   Procedure: SUPERFICIAL BONE BIOPSY;  Surgeon: Evelina Bucy, DPM;  Location: [place];  Service: Podiatry;  Laterality: Left;  . CARDIAC CATHETERIZATION N/A 05/19/2016   Procedure: Right/Left Heart Cath and Coronary Angiography;  Surgeon: Belva Crome, MD;  Location: [place] CV LAB;  Service: Cardiovascular;  Laterality: N/A;  . CERVICAL FUSION    . ENDARTERECTOMY Left 11/29/2013   Procedure: ENDARTERECTOMY  CAROTID;  Surgeon: Serafina Mitchell, MD;  Location: [facility] OR;  Service: Vascular;  Laterality: Left;  . ESOPHAGOGASTRODUODENOSCOPY  02/2010   Dr. Gala Romney: patient presented with food impaction, schatzki ring with superimposed component of stricture with erosive reflux esophagitis, s/p disimpaction but dilation planned at later date   . ESOPHAGOGASTRODUODENOSCOPY (EGD) WITH PROPOFOL N/A 08/13/2019   Dr. Gala Romney: Erosive reflux esophagitis with mild stricture and incidental Mallory-Weiss tear which precluded esophageal dilation.  Medium sized hiatal hernia.  [name] ESOPHAGOGASTRODUODENOSCOPY (EGD) WITH PROPOFOL N/A 10/11/2019   Dr. Gala Romney: Esophageal  stenosis status post dilation, moderate hiatal hernia  . EYE SURGERY    . FLEXIBLE SIGMOIDOSCOPY N/A 08/13/2019   Procedure: FLEXIBLE SIGMOIDOSCOPY;  Surgeon: [name], MD;  [facility];  Service: Endoscopy;  Laterality: N/A;  colonoscopy aboerted due to formed stool and poor prep  . FOOT SURGERY    . Lower ext duplex doppler  03/14/14   Rt ABI 1.2  . LOWER EXTREMITY ANGIOGRAM Bilateral 02/18/2014   Procedure: LOWER EXTREMITY ANGIOGRAM;  Surgeon: [name], MD;  Location: [facility] CATH LAB;  Service: Cardiovascular;  Laterality: Bilateral;  . LOWER EXTREMITY ANGIOGRAM N/A 10/31/2014   Procedure: LOWER EXTREMITY ANGIOGRAM;  Surgeon: [name], MD;  Location: [facility] CATH LAB;  Service: Cardiovascular;  Laterality: N/A;  . Venia Minks DILATION N/A 10/11/2019   Procedure: Venia Minks DILATION;  Surgeon: [name], MD;  [facility];  Service: Endoscopy;  Laterality: N/A;  . MULTIPLE TOOTH EXTRACTIONS    . PV angiogram  02/18/2014   tibial vessel diseas bil.  [name] SPINE SURGERY    . tendon achillies lengthing and sesamoid      FAMILY HISTORY: Family History  Problem Relation Age of Onset  . Heart disease Mother   . Hypertension Mother   . Heart attack Mother   . Hypertension Father   . Diabetes Father   . Diabetes Son   . Heart disease  Son   . Hypertension Son   . Colon cancer Neg Hx     SOCIAL HISTORY: Social History   Socioeconomic History  . Marital status: Married    Spouse name: Not on file  . Number of children: Not on file  . Years of education: college  . Highest education level: Not on file  Occupational History  . Occupation: Firefighter   Tobacco Use  . Smoking status: Former Smoker    Years: 1.00    Types: Pipe    Quit date: 07/08/1977    Years since quitting: 42.6  . Smokeless tobacco: Never Used  Substance and Sexual Activity  . Alcohol use: No    Alcohol/week: 0.0 standard drinks  . Drug use: No  . Sexual activity: Not on file  Other Topics Concern  . Not on file  Social History Narrative   Drinks 3 44oz cokes a day    Social Determinants of Radio broadcast assistant Strain:   . Difficulty of Paying Living Expenses:   Food Insecurity:   . Worried About Charity fundraiser in the Last Year:   . Arboriculturist in the Last Year:   Transportation Needs:   . Film/video editor (Medical):   [name] Lack of Transportation (Non-Medical):   Physical Activity:   . Days of Exercise per Week:   . Minutes of Exercise per Session:   Stress:   . Feeling of Stress :   Social Connections:   . Frequency of Communication with Friends and Family:   . Frequency of Social Gatherings with Friends and Family:   . Attends Religious Services:   . Active Member of Clubs or Organizations:   . Attends Archivist Meetings:   [name] Marital Status:   Intimate Partner Violence:   . Fear of Current or Ex-Partner:   . Emotionally Abused:   [name] Physically Abused:   . Sexually Abused:       PHYSICAL EXAM  Vitals:   02/25/20 0910  BP: (!) 144/76  Pulse: 77  Weight: 217  lb (98.4 kg)  Height: 6' (1.829 m)   Body mass index is 29.43 kg/m.  Generalized: Well developed, in no acute distress   Neurological examination  Mentation: Alert oriented to time, place, history taking. Follows all  commands speech and language fluent Cranial nerve II-XII: Pupils were equal round reactive to light. Extraocular movements were full, visual field were full on confrontational test. Facial sensation and strength were normal. Uvula tongue midline. Head turning and shoulder shrug  were normal and symmetric. Motor: The motor testing reveals 5 over 5 strength in all extremities with exception of left lower extremity.3-4/5 in the left lower extremity.  Some discomfort with lifting and extending the left leg Sensory: Sensory testing is intact to soft touch on all 4 extremities. No evidence of extinction is noted.  Coordination: Cerebellar testing reveals good finger-nose-finger and heel-to-shin bilaterally.  Gait and station: Patient is using a wheelchair today    DIAGNOSTIC DATA (LABS, IMAGING, TESTING) - I reviewed patient records, labs, notes, testing and imaging myself where available.  Lab Results  Component Value Date   WBC 9.2 10/09/2019   HGB 11.5 (L) 10/09/2019   HCT 33.5 (L) 10/09/2019   MCV 97.4 10/09/2019   PLT 216 10/09/2019      Component Value Date/Time   NA 132 (L) 10/09/2019 1519   K 3.4 (L) 10/09/2019 1519   CL 93 (L) 10/09/2019 1519   CO2 29 10/09/2019 1519   GLUCOSE 375 (H) 10/09/2019 1519   BUN 15 10/09/2019 1519   CREATININE 0.89 10/09/2019 1519   CREATININE 1.00 07/01/2016 1132   CALCIUM 8.7 (L) 10/09/2019 1519   PROT 6.4 (L) 05/08/2018 0517   ALBUMIN 3.1 (L) 05/08/2018 0517   AST 17 05/08/2018 0517   ALT 16 05/08/2018 0517   ALKPHOS 53 05/08/2018 0517   BILITOT 0.8 05/08/2018 0517   GFRNONAA >60 10/09/2019 1519   GFRNONAA 53 (L) 06/16/2016 1427   GFRAA >60 10/09/2019 1519   GFRAA 61 06/16/2016 1427   Lab Results  Component Value Date   CHOL 162 11/26/2014   HDL 42 11/26/2014   LDLCALC 95 11/26/2014   TRIG 127 11/26/2014   CHOLHDL 3.9 11/26/2014   Lab Results  Component Value Date   HGBA1C 6.9 (H) 05/07/2018   No results found for: [id number] Lab  Results  Component Value Date   TSH 4.054 02/12/2014      ASSESSMENT AND PLAN 74 y.o. year old male  has a past medical history of Arthritis, Cardiomyopathy- EF NL 01/2013, now 30-35% echo (03/14/2012), Carotid artery occlusion, CHF (congestive heart failure) ([place]), Diabetes mellitus, GERD (gastroesophageal reflux disease), HOH (hard of hearing), Hypertension, Hypothyroidism, Obstructive sleep apnea, Orthostatic hypotension, Osteomyelitis ([place]), Peripheral arterial disease ([place]), s/p PTA x 2 RLE, Pneumonia, PONV (postoperative nausea and vomiting), Restless leg syndrome, Shortness of breath, Stroke [facility]) (March 08, 2012), and Wears dentures. here with:  Restless leg syndrome   Restart Mirapex  Obstructive sleep apnea   Restart CPAP  I did advise the patient that he should consult with his PCP or consider going to urgent care for evaluation of left hip pain.  Patient voiced understanding.  He will follow-up in 1 year or sooner if needed  I spent 20 minutes of face-to-face and non-face-to-face time with patient.  This included previsit chart review, lab review, study review, order entry, electronic health record documentation, patient education.  Ward Givens, MSN, NP-C 02/25/2020, 9:22 AM [facility] [address], [place], [place] [address] [phone]  [phone]  I reviewed the above note and documentation by the Nurse Practitioner and agree with the history, exam, assessment and plan as outlined above. I was available for consultation. Star Age, MD, PhD [facility])

## 2020-02-25 NOTE — Discharge Instructions (Signed)

## 2020-02-25 NOTE — Patient Instructions (Signed)
Restart CPAP Restart Mirapex If your symptoms worsen or you develop new symptoms please let us know.

## 2020-02-25 NOTE — ED Provider Notes (Signed)
Emergency Department Provider Note   I have reviewed the triage vital signs and the nursing notes.   HISTORY  Chief Complaint Hip Pain (left)   HPI KEASTON PILE is a 74 y.o. male with past medical history reviewed below presents to the emergency department with left hip and thigh pain after a mechanical fall.  Patient fell  3 weeks ago while walking into the house from his shed.  The sole of the shoe came undone and tripped him.  He did strike his face on the ground but did not lose consciousness.  He had no lingering headache or other symptoms.  He was able to get up under his own power and has been ambulatory with his cane.  He is noticed pain in the left hip and thigh area.  Has had difficulty straightening his left knee at times.  He feels worse in the morning and then symptoms gradually improve throughout the day.  He states he came in today because his wife and his neurologist recommended that he come for an x-ray with continued lingering symptoms.  He was having some left buttock type pain but that has resolved.  He states that he was having some discomfort in the waiting room here but after standing and moving his leg his symptoms have improved.    Past Medical History:  Diagnosis Date  . Arthritis   . Cardiomyopathy- EF NL 01/2013, now 30-35% echo 03/14/2012  . Carotid artery occlusion    left s/p CEA  . CHF (congestive heart failure) ([place])   . Diabetes mellitus   . GERD (gastroesophageal reflux disease)   . HOH (hard of hearing)   . Hypertension   . Hypothyroidism   . Obstructive sleep apnea   . Orthostatic hypotension   . Osteomyelitis ([place])    left great toe  . Peripheral arterial disease ([place]), s/p PTA x 2 RLE    nonhealing ulcers bilaterally on each great toe  . Pneumonia   . PONV (postoperative nausea and vomiting)   . Restless leg syndrome   . Shortness of breath   . Stroke [facility]) March 08, 2012  . Wears dentures     Patient Active Problem List   Diagnosis Date Noted  . Anemia 09/03/2019  . Reflux esophagitis 09/03/2019  . Esophageal dysphagia 05/30/2019  . Constipation 05/30/2019  . Positive colorectal cancer screening using Cologuard test 05/30/2019  . Diabetic foot ulcer ([place]) 09/06/2018  . Toe osteomyelitis, left ([place])   . Ulcer of great toe, left, with necrosis of bone ([place])   . Cellulitis of left foot 05/07/2018  . Orthostatic hypotension 08/25/2016  . Arrhythmia 05/20/2016  . Angina decubitus ([place]) 05/19/2016  . Congestive heart failure ([place]) 05/17/2016  . Pain in the chest 05/17/2016  . Pulmonary nodule 05/17/2016  . Elevated troponin 04/11/2016  . Nonhealing skin ulcer ([place]) 10/28/2014  . Bilateral carotid artery disease ([place]) 07/08/2014  . Critical lower limb ischemia 02/28/2014  . Obstructive sleep apnea 02/12/2014  . Aftercare following surgery of the circulatory system, [place] 12/04/2013  . Restless leg syndrome 10/23/2013  . Obesity (BMI 30.0-34.9) 10/23/2013  . Hyperlipidemia 07/16/2013  . Peripheral arterial disease ([place]) 07/16/2013  . History of stroke June 2013 03/14/2012  . Hypertension 03/14/2012  . Type 2 diabetes mellitus with circulatory disorder ([place]) 03/14/2012  . Peripheral neuropathy 03/14/2012  . Cardiomyopathy- EF NL 01/2013, now 30-35% echo 03/14/2012  . ARTHRITIS, RIGHT FOOT 06/26/2008    Past Surgical History:  Procedure Laterality  Date  . AMPUTATION TOE Left 07/25/2018   Procedure: AMPUTATION TOE INTERPHALANGEAL HALLUX LEFT;  Surgeon: Evelina Bucy, DPM;  Location: [place];  Service: Podiatry;  Laterality: Left;  . ANGIOPLASTY  02/28/14   diamond back orbital rotational atherectomy of Rt. tibial  . BACK SURGERY    . BONE BIOPSY Left 07/25/2018   Procedure: SUPERFICIAL BONE BIOPSY;  Surgeon: Evelina Bucy, DPM;  Location: [place];  Service: Podiatry;  Laterality: Left;  . CARDIAC CATHETERIZATION N/A 05/19/2016   Procedure: Right/Left Heart Cath and Coronary Angiography;  Surgeon: Belva Crome, MD;  Location: [place] CV LAB;  Service: Cardiovascular;  Laterality: N/A;  . CERVICAL FUSION    . ENDARTERECTOMY Left 11/29/2013   Procedure: ENDARTERECTOMY CAROTID;  Surgeon: Serafina Mitchell, MD;  Location: [facility] OR;  Service: Vascular;  Laterality: Left;  . ESOPHAGOGASTRODUODENOSCOPY  02/2010   Dr. Gala Romney: patient presented with food impaction, schatzki ring with superimposed component of stricture with erosive reflux esophagitis, s/p disimpaction but dilation planned at later date   . ESOPHAGOGASTRODUODENOSCOPY (EGD) WITH PROPOFOL N/A 08/13/2019   Dr. Gala Romney: Erosive reflux esophagitis with mild stricture and incidental Mallory-Weiss tear which precluded esophageal dilation.  Medium sized hiatal hernia.  [name] ESOPHAGOGASTRODUODENOSCOPY (EGD) WITH PROPOFOL N/A 10/11/2019   Dr. Gala Romney: Esophageal stenosis status post dilation, moderate hiatal hernia  . EYE SURGERY    . FLEXIBLE SIGMOIDOSCOPY N/A 08/13/2019   Procedure: FLEXIBLE SIGMOIDOSCOPY;  Surgeon: [name], MD;  [facility];  Service: Endoscopy;  Laterality: N/A;  colonoscopy aboerted due to formed stool and poor prep  . FOOT SURGERY    . Lower ext duplex doppler  03/14/14   Rt ABI 1.2  . LOWER EXTREMITY ANGIOGRAM Bilateral 02/18/2014   Procedure: LOWER EXTREMITY ANGIOGRAM;  Surgeon: [name], MD;  Location: [facility] CATH LAB;  Service: Cardiovascular;  Laterality: Bilateral;  . LOWER EXTREMITY ANGIOGRAM N/A 10/31/2014   Procedure: LOWER EXTREMITY ANGIOGRAM;  Surgeon: [name], MD;  Location: [facility] CATH LAB;  Service: Cardiovascular;  Laterality: N/A;  . Venia Minks DILATION N/A 10/11/2019   Procedure: Venia Minks DILATION;  Surgeon: [name], MD;  [facility];  Service: Endoscopy;  Laterality: N/A;  . MULTIPLE TOOTH EXTRACTIONS    . PV angiogram  02/18/2014   tibial vessel diseas bil.  [name] SPINE SURGERY    . tendon achillies lengthing and sesamoid      Allergies Quinoline yellow ss [yellow dye #11],  Codeine, and Tramadol  Family History  Problem Relation Age of Onset  . Heart disease Mother   . Hypertension Mother   . Heart attack Mother   . Hypertension Father   . Diabetes Father   . Diabetes Son   . Heart disease Son   . Hypertension Son   . Colon cancer Neg Hx     Social History Social History   Tobacco Use  . Smoking status: Former Smoker    Years: 1.00    Types: Pipe    Quit date: 07/08/1977    Years since quitting: 42.6  . Smokeless tobacco: Never Used  Substance Use Topics  . Alcohol use: No    Alcohol/week: 0.0 standard drinks  . Drug use: No    Review of Systems  Constitutional: No fever/chills Cardiovascular: Denies chest pain. Respiratory: Denies shortness of breath. Gastrointestinal: No abdominal pain.   Musculoskeletal: Negative for back pain. Positive left hip/thigh pain.  Skin: Negative for rash. Neurological: Negative for headaches.  10-point ROS otherwise negative.  ____________________________________________   PHYSICAL EXAM:  VITAL SIGNS: ED Triage Vitals [02/25/20 1720]  Enc Vitals Group     BP 135/83     Pulse Rate 83     Resp 16     Temp (!) 97.4 F (36.3 C)     Temp Source Temporal     SpO2 96 %     Weight 217 lb (98.4 kg)     Height 6' (1.829 m)   Constitutional: Alert and oriented. Well appearing and in no acute distress. Eyes: Conjunctivae are normal.  Head: Atraumatic. Nose: No congestion/rhinnorhea. Mouth/Throat: Mucous membranes are moist.  Neck: No stridor.   Cardiovascular: Good peripheral circulation.  Respiratory: Normal respiratory effort.  Gastrointestinal: No distention.  Musculoskeletal: Patient ambulatory in the emergency department with a cane.  Normal range of motion of the left hip and knee.  The left knee is mildly swollen in comparison to the right but baseline per patient.  No erythema or warmth over the joint.  Neurologic:  Normal speech and language.  Skin:  Skin is warm, dry and intact. No rash  noted.  ____________________________________________  [id number]  DG Hip Unilat W or Wo Pelvis 2-3 Views Left  Result Date: 02/25/2020 CLINICAL DATA:  Status post fall EXAM: DG HIP (WITH OR WITHOUT PELVIS) 2-3V LEFT COMPARISON:  None. FINDINGS: Frontal pelvis as well as frontal and lateral left hip images were obtained. No fracture or dislocation. There is mild symmetric narrowing of each hip joint. No erosive change. There is postoperative change in the lower lumbar region. IMPRESSION: Slight symmetric narrowing of each hip joint. No fracture or dislocation. Electronically Signed   By: Lowella Grip III M.D.   On: 02/25/2020 18:25    ____________________________________________   PROCEDURES  Procedure(s) performed:   Procedures  None  ____________________________________________   INITIAL IMPRESSION / ASSESSMENT AND PLAN / ED COURSE  Pertinent labs & imaging results that were available during my care of the patient were reviewed by me and considered in my medical decision making (see chart for details).   Patient presents to the emergency department after mechanical fall with lingering left hip pain.  The x-ray was reviewed by me and shows arthritis but no fracture or dislocation.  My suspicion for an occult fracture is very low.  Patient has full range of motion of his left knee with no evidence of infection or septic joint.  He is having some pain over the musculature of his left quad without bruising or rash.  Plan for Voltaren at home with Tylenol.  Will provide contact information for Dr. Aline Brochure who is seen in the past and if symptoms continue to linger he can follow there for further evaluation and treatment.    ____________________________________________  FINAL CLINICAL IMPRESSION(S) / ED DIAGNOSES  Final diagnoses:  Left hip pain  Fall, initial encounter    NEW OUTPATIENT MEDICATIONS STARTED DURING THIS VISIT:  New Prescriptions   DICLOFENAC SODIUM (VOLTAREN)  1 % GEL    Apply 2 g topically 4 (four) times daily as needed.    Note:  This document was prepared using Dragon voice recognition software and may include unintentional dictation errors.  Nanda Quinton, MD, [facility] Emergency Medicine    Mckensi Redinger, Wonda Olds, MD 02/25/20 2147

## 2020-02-25 NOTE — ED Triage Notes (Signed)
Pt fell 3 weeks ago and hit face, a week later began to have left hip pain.   Also left knee unable to straighten out and c/o weakness.  PCP has recently retired and has no PCP at this time.

## 2020-02-26 ENCOUNTER — Ambulatory Visit (HOSPITAL_COMMUNITY): Payer: PPO | Admitting: Physical Therapy

## 2020-02-26 ENCOUNTER — Telehealth (HOSPITAL_COMMUNITY): Payer: Self-pay

## 2020-02-26 ENCOUNTER — Encounter (HOSPITAL_COMMUNITY): Payer: Self-pay | Admitting: Physical Therapy

## 2020-02-26 DIAGNOSIS — R262 Difficulty in walking, not elsewhere classified: Secondary | ICD-10-CM | POA: Diagnosis not present

## 2020-02-26 DIAGNOSIS — E08621 Diabetes mellitus due to underlying condition with foot ulcer: Secondary | ICD-10-CM

## 2020-02-26 DIAGNOSIS — L97411 Non-pressure chronic ulcer of right heel and midfoot limited to breakdown of skin: Secondary | ICD-10-CM

## 2020-02-26 NOTE — Telephone Encounter (Signed)
No show.  Called and spoke to wife who stated Zadrian thought apt was this afternoon.  Rescheduled for 2:45 later today.    [name], LPTA/CLT; Delana Meyer [phone]

## 2020-02-26 NOTE — Therapy (Signed)
[place], [place], [address] Phone: [phone]   Fax:  [phone]  Wound Care Therapy  Patient Details  Name: [name] MRN: [id number] Date of Birth: 1946-09-03 Referring Provider (PT): Hardie Pulley, DPM   Encounter Date: 02/26/2020  PT End of Session - 02/26/20 1537    Visit Number  3    Number of Visits  16    Date for PT Re-Evaluation  04/15/20    Authorization Type  [employer] no VL no auth    Progress Note Due on Visit  10    PT Start Time  1450    PT Stop Time  1530    PT Time Calculation (min)  40 min    Activity Tolerance  Patient tolerated treatment well;No increased pain       Past Medical History:  Diagnosis Date  . Arthritis   . Cardiomyopathy- EF NL 01/2013, now 30-35% echo 03/14/2012  . Carotid artery occlusion    left s/p CEA  . CHF (congestive heart failure) ([place])   . Diabetes mellitus   . GERD (gastroesophageal reflux disease)   . HOH (hard of hearing)   . Hypertension   . Hypothyroidism   . Obstructive sleep apnea   . Orthostatic hypotension   . Osteomyelitis ([place])    left great toe  . Peripheral arterial disease ([place]), s/p PTA x 2 RLE    nonhealing ulcers bilaterally on each great toe  . Pneumonia   . PONV (postoperative nausea and vomiting)   . Restless leg syndrome   . Shortness of breath   . Stroke [facility]) March 08, 2012  . Wears dentures     Past Surgical History:  Procedure Laterality Date  . AMPUTATION TOE Left 07/25/2018   Procedure: AMPUTATION TOE INTERPHALANGEAL HALLUX LEFT;  Surgeon: Evelina Bucy, DPM;  Location: [place];  Service: Podiatry;  Laterality: Left;  . ANGIOPLASTY  02/28/14   diamond back orbital rotational atherectomy of Rt. tibial  . BACK SURGERY    . BONE BIOPSY Left 07/25/2018   Procedure: SUPERFICIAL BONE BIOPSY;  Surgeon: Evelina Bucy, DPM;  Location: [place];  Service: Podiatry;  Laterality: Left;  . CARDIAC CATHETERIZATION N/A  05/19/2016   Procedure: Right/Left Heart Cath and Coronary Angiography;  Surgeon: Belva Crome, MD;  Location: [place] CV LAB;  Service: Cardiovascular;  Laterality: N/A;  . CERVICAL FUSION    . ENDARTERECTOMY Left 11/29/2013   Procedure: ENDARTERECTOMY CAROTID;  Surgeon: Serafina Mitchell, MD;  Location: [facility] OR;  Service: Vascular;  Laterality: Left;  . ESOPHAGOGASTRODUODENOSCOPY  02/2010   Dr. Gala Romney: patient presented with food impaction, schatzki ring with superimposed component of stricture with erosive reflux esophagitis, s/p disimpaction but dilation planned at later date   . ESOPHAGOGASTRODUODENOSCOPY (EGD) WITH PROPOFOL N/A 08/13/2019   Dr. Gala Romney: Erosive reflux esophagitis with mild stricture and incidental Mallory-Weiss tear which precluded esophageal dilation.  Medium sized hiatal hernia.  [name] ESOPHAGOGASTRODUODENOSCOPY (EGD) WITH PROPOFOL N/A 10/11/2019   Dr. Gala Romney: Esophageal stenosis status post dilation, moderate hiatal hernia  . EYE SURGERY    . FLEXIBLE SIGMOIDOSCOPY N/A 08/13/2019   Procedure: FLEXIBLE SIGMOIDOSCOPY;  Surgeon: [name], MD;  [facility];  Service: Endoscopy;  Laterality: N/A;  colonoscopy aboerted due to formed stool and poor prep  . FOOT SURGERY    . Lower ext duplex doppler  03/14/14   Rt ABI 1.2  . LOWER EXTREMITY ANGIOGRAM  Bilateral 02/18/2014   Procedure: LOWER EXTREMITY ANGIOGRAM;  Surgeon: [name], MD;  Location: [facility] CATH LAB;  Service: Cardiovascular;  Laterality: Bilateral;  . LOWER EXTREMITY ANGIOGRAM N/A 10/31/2014   Procedure: LOWER EXTREMITY ANGIOGRAM;  Surgeon: [name], MD;  Location: [facility] CATH LAB;  Service: Cardiovascular;  Laterality: N/A;  . Venia Minks DILATION N/A 10/11/2019   Procedure: Venia Minks DILATION;  Surgeon: [name], MD;  [facility];  Service: Endoscopy;  Laterality: N/A;  . MULTIPLE TOOTH EXTRACTIONS    . PV angiogram  02/18/2014   tibial vessel diseas bil.  [name] SPINE SURGERY    . tendon  achillies lengthing and sesamoid      There were no vitals filed for this visit.     [facility] PT Assessment - 02/26/20 0001      Assessment   Medical Diagnosis  R midfoot diabetic ulcer    Referring Provider (PT)  Hardie Pulley, DPM              Wound Therapy - 02/26/20 0001    Subjective  Patietn reports his hip is feeling better. He got xrays and that didn't show anything. reports he is using volteran gel on hip and that helps but he is reall dehydrated. States he changed his dressing on saturday after he took a shower    Patient and Family Stated Goals  to have wound healed    Date of Onset  --   3-4 years ago   Prior Treatments  wound therapy at 3 different clinics, debridement by MD.     Pain Scale  0-10    Pain Score  0-No pain    Wound Properties Date First Assessed: 02/19/20 Time First Assessed: 1300 Wound Type: Diabetic ulcer Location: Foot Location Orientation: Right;Other (Comment) Wound Description (Comments): plantar aspect at head of first metatarsal  Present on Admission: Yes   Dressing Type  Impregnated gauze (bismuth)   bandaid   Dressing Changed  Changed    Dressing Status  Dry;Old drainage    Dressing Change Frequency  PRN    Site / Wound Assessment  Pink;Red;Dry;Dusky    % Wound base Red or Granulating  75%    % Wound base Other/Granulation Tissue (Comment)  25%    Wound Length (cm)  2 cm   was 1.8   Wound Width (cm)  1.4 cm   was 1.4   Wound Depth (cm)  0.6 cm   was .6   Wound Volume (cm^3)  1.68 cm^3    Wound Surface Area (cm^2)  2.8 cm^2    Margins  Unattached edges (unapproximated)    Drainage Amount  None    Treatment  Debridement (Selective);Cleansed    Selective Debridement - Location  plantar aspect of R foot    Selective Debridement - Tools Used  Scalpel;Forceps    Selective Debridement - Tissue Removed  devitalized tissue, callus    Wound Therapy - Clinical Statement  Patient returns with bandage changed but xeroform in place. Debrided  wound and replaced 2x2 and medipore tape. Gait slower on this date. Instructed patient to always use rolling walker when he leave home secondary to fall risk. Walked patient out to car secondary to gait and his use of clinic walker while in clinic.     Wound Therapy - Functional Problem List  difficulty walking, bathing transfering     Factors Delaying/Impairing Wound Healing  Diabetes Mellitus;Vascular compromise;Other (comment)    Wound Therapy - Frequency  2X / week    Wound Therapy - Current Recommendations  PT    Wound Plan  continue with woundcare using appropriate dressings to promote healing.    Dressing   xeroform, 2x2, medipore              PT Education - 02/26/20 1536    Education Details  on using RW at all times when out of house    Person(s) Educated  Patient    Methods  Explanation    Comprehension  Verbalized understanding       PT Short Term Goals - 02/19/20 1540      PT SHORT TERM GOAL #1   Title  Wound will measure no more than 2.0 cm^2  to demeonstrate decreased wound size    Time  4    Period  Weeks    Status  New    Target Date  03/18/20      PT SHORT TERM GOAL #2   Title  Paitent will consistently wear diabetic shoes to decrease pressure on plantar surface of wound to allow it to heal.    Time  4    Period  Weeks    Status  New    Target Date  03/18/20        PT Long Term Goals - 02/19/20 1541      PT LONG TERM GOAL #1   Time  --    Period  --    Status  --    Target Date  --      PT LONG TERM GOAL #2   Title  Patient will be independent in self care techniques in regards to skin care and foot inspecation to decrease risk of additional wounds    Time  8    Period  Weeks    Status  New    Target Date  04/15/20      PT LONG TERM GOAL #3   Title  Wound will be completely healed to improve ability to ambulate    Time  8    Period  Weeks    Target Date  04/15/20            Plan - 02/26/20 1537    Clinical Impression Statement   see above    Personal Factors and Comorbidities  Age;Comorbidity 1;Comorbidity 3+    Comorbidities  CHF, PAD, DB    Examination-Activity Limitations  Locomotion Level;Squat;Stairs;Stand    Stability/Clinical Decision Making  Evolving/Moderate complexity    Rehab Potential  Fair    PT Frequency  2x / week    PT Duration  8 weeks    PT Treatment/Interventions  ADLs/Self Care Home Management;Manual techniques;Orthotic Fit/Training;Patient/family education;Gait training;Therapeutic exercise    PT Next Visit Plan  continue with wound care plan    Consulted and Agree with Plan of Care  Patient       Patient will benefit from skilled therapeutic intervention in order to improve the following deficits and impairments:  Pain, Decreased range of motion, Decreased skin integrity, Decreased endurance, Increased edema, Decreased knowledge of precautions, Decreased activity tolerance, Decreased balance, Difficulty walking  Visit Diagnosis: Difficulty in walking, not elsewhere classified  Diabetic ulcer of right midfoot associated with diabetes mellitus due to underlying condition, limited to breakdown of skin [facility])     Problem List Patient Active Problem List   Diagnosis Date Noted  . Anemia 09/03/2019  . Reflux esophagitis 09/03/2019  . Esophageal dysphagia 05/30/2019  . Constipation 05/30/2019  .  Positive colorectal cancer screening using Cologuard test 05/30/2019  . Diabetic foot ulcer ([place]) 09/06/2018  . Toe osteomyelitis, left ([place])   . Ulcer of great toe, left, with necrosis of bone ([place])   . Cellulitis of left foot 05/07/2018  . Orthostatic hypotension 08/25/2016  . Arrhythmia 05/20/2016  . Angina decubitus ([place]) 05/19/2016  . Congestive heart failure ([place]) 05/17/2016  . Pain in the chest 05/17/2016  . Pulmonary nodule 05/17/2016  . Elevated troponin 04/11/2016  . Nonhealing skin ulcer ([place]) 10/28/2014  . Bilateral carotid artery disease ([place]) 07/08/2014  . Critical lower limb  ischemia 02/28/2014  . Obstructive sleep apnea 02/12/2014  . Aftercare following surgery of the circulatory system, [place] 12/04/2013  . Restless leg syndrome 10/23/2013  . Obesity (BMI 30.0-34.9) 10/23/2013  . Hyperlipidemia 07/16/2013  . Peripheral arterial disease ([place]) 07/16/2013  . History of stroke June 2013 03/14/2012  . Hypertension 03/14/2012  . Type 2 diabetes mellitus with circulatory disorder ([place]) 03/14/2012  . Peripheral neuropathy 03/14/2012  . Cardiomyopathy- EF NL 01/2013, now 30-35% echo 03/14/2012  . ARTHRITIS, RIGHT FOOT 06/26/2008   3:52 PM, 02/26/20 [name], DPT Physical Therapy with [facility]  [phone] office  [place] [address] [place], [place], [address] Phone: [phone]   Fax:  [phone]  Name: ELOY FEHL MRN: [id number] Date of Birth: 1946-03-10

## 2020-02-28 ENCOUNTER — Ambulatory Visit (HOSPITAL_COMMUNITY): Payer: PPO | Admitting: Physical Therapy

## 2020-02-28 ENCOUNTER — Encounter: Payer: Self-pay | Admitting: Family Medicine

## 2020-02-28 ENCOUNTER — Ambulatory Visit (INDEPENDENT_AMBULATORY_CARE_PROVIDER_SITE_OTHER): Payer: PPO | Admitting: Family Medicine

## 2020-02-28 ENCOUNTER — Other Ambulatory Visit: Payer: Self-pay

## 2020-02-28 VITALS — BP 119/68 | HR 79 | Temp 97.2°F | Ht 72.0 in | Wt 218.8 lb

## 2020-02-28 DIAGNOSIS — K2100 Gastro-esophageal reflux disease with esophagitis, without bleeding: Secondary | ICD-10-CM

## 2020-02-28 DIAGNOSIS — E1159 Type 2 diabetes mellitus with other circulatory complications: Secondary | ICD-10-CM | POA: Diagnosis not present

## 2020-02-28 DIAGNOSIS — L97411 Non-pressure chronic ulcer of right heel and midfoot limited to breakdown of skin: Secondary | ICD-10-CM

## 2020-02-28 DIAGNOSIS — I10 Essential (primary) hypertension: Secondary | ICD-10-CM | POA: Diagnosis not present

## 2020-02-28 DIAGNOSIS — G2581 Restless legs syndrome: Secondary | ICD-10-CM | POA: Diagnosis not present

## 2020-02-28 DIAGNOSIS — Z6829 Body mass index (BMI) 29.0-29.9, adult: Secondary | ICD-10-CM | POA: Diagnosis not present

## 2020-02-28 DIAGNOSIS — G4733 Obstructive sleep apnea (adult) (pediatric): Secondary | ICD-10-CM | POA: Diagnosis not present

## 2020-02-28 DIAGNOSIS — R195 Other fecal abnormalities: Secondary | ICD-10-CM | POA: Diagnosis not present

## 2020-02-28 DIAGNOSIS — R262 Difficulty in walking, not elsewhere classified: Secondary | ICD-10-CM

## 2020-02-28 DIAGNOSIS — I6523 Occlusion and stenosis of bilateral carotid arteries: Secondary | ICD-10-CM | POA: Diagnosis not present

## 2020-02-28 DIAGNOSIS — I509 Heart failure, unspecified: Secondary | ICD-10-CM | POA: Diagnosis not present

## 2020-02-28 DIAGNOSIS — E663 Overweight: Secondary | ICD-10-CM

## 2020-02-28 DIAGNOSIS — E782 Mixed hyperlipidemia: Secondary | ICD-10-CM

## 2020-02-28 DIAGNOSIS — E08621 Diabetes mellitus due to underlying condition with foot ulcer: Secondary | ICD-10-CM

## 2020-02-28 LAB — POCT GLYCOSYLATED HEMOGLOBIN (HGB A1C)
HbA1c POC (<> result, manual entry): 10.3 % (ref 4.0–5.6)
HbA1c, POC (controlled diabetic range): 10.3 % — AB (ref 0.0–7.0)
HbA1c, POC (prediabetic range): 10.3 % — AB (ref 5.7–6.4)
Hemoglobin A1C: 10.3 % — AB (ref 4.0–5.6)

## 2020-02-28 NOTE — Patient Instructions (Addendum)
I appreciate the opportunity to provide you with care for your health and wellness. Today we discussed: establish care   Follow up: 3 months-A1c in office   Labs-fasting at next appt No referrals  Call me back with you medication name.  Nice to meet you today!  Please continue to practice social distancing to keep you, your family, and our community safe.  If you must go out, please wear a mask and practice good handwashing.  It was a pleasure to see you and I look forward to continuing to work together on your health and well-being. Please do not hesitate to call the office if you need care or have questions about your care.  Have a wonderful day and week. With Gratitude, Cherly Beach, DNP, AGNP-BC

## 2020-02-28 NOTE — Therapy (Signed)
[place], [place], [address] Phone: [phone]   Fax:  [phone]  Wound Care Therapy  Patient Details  Name: [name] MRN: [id number] Date of Birth: 1946-01-01 Referring Provider (PT): Hardie Pulley, DPM   Encounter Date: 02/28/2020    Past Medical History:  Diagnosis Date  . Arthritis   . Cardiomyopathy- EF NL 01/2013, now 30-35% echo 03/14/2012  . Carotid artery occlusion    left s/p CEA  . CHF (congestive heart failure) ([place])   . Diabetes mellitus   . GERD (gastroesophageal reflux disease)   . HOH (hard of hearing)   . Hypertension   . Hypothyroidism   . Obstructive sleep apnea   . Orthostatic hypotension   . Osteomyelitis ([place])    left great toe  . Peripheral arterial disease ([place]), s/p PTA x 2 RLE    nonhealing ulcers bilaterally on each great toe  . Pneumonia   . PONV (postoperative nausea and vomiting)   . Restless leg syndrome   . Shortness of breath   . Stroke [facility]) March 08, 2012  . Wears dentures     Past Surgical History:  Procedure Laterality Date  . AMPUTATION TOE Left 07/25/2018   Procedure: AMPUTATION TOE INTERPHALANGEAL HALLUX LEFT;  Surgeon: Evelina Bucy, DPM;  Location: [place];  Service: Podiatry;  Laterality: Left;  . ANGIOPLASTY  02/28/14   diamond back orbital rotational atherectomy of Rt. tibial  . BACK SURGERY    . BONE BIOPSY Left 07/25/2018   Procedure: SUPERFICIAL BONE BIOPSY;  Surgeon: Evelina Bucy, DPM;  Location: [place];  Service: Podiatry;  Laterality: Left;  . CARDIAC CATHETERIZATION N/A 05/19/2016   Procedure: Right/Left Heart Cath and Coronary Angiography;  Surgeon: Belva Crome, MD;  Location: [place] CV LAB;  Service: Cardiovascular;  Laterality: N/A;  . CERVICAL FUSION    . ENDARTERECTOMY Left 11/29/2013   Procedure: ENDARTERECTOMY CAROTID;  Surgeon: Serafina Mitchell, MD;  Location: [facility] OR;  Service: Vascular;  Laterality: Left;  .  ESOPHAGOGASTRODUODENOSCOPY  02/2010   Dr. Gala Romney: patient presented with food impaction, schatzki ring with superimposed component of stricture with erosive reflux esophagitis, s/p disimpaction but dilation planned at later date   . ESOPHAGOGASTRODUODENOSCOPY (EGD) WITH PROPOFOL N/A 08/13/2019   Dr. Gala Romney: Erosive reflux esophagitis with mild stricture and incidental Mallory-Weiss tear which precluded esophageal dilation.  Medium sized hiatal hernia.  [name] ESOPHAGOGASTRODUODENOSCOPY (EGD) WITH PROPOFOL N/A 10/11/2019   Dr. Gala Romney: Esophageal stenosis status post dilation, moderate hiatal hernia  . EYE SURGERY    . FLEXIBLE SIGMOIDOSCOPY N/A 08/13/2019   Procedure: FLEXIBLE SIGMOIDOSCOPY;  Surgeon: [name], MD;  [facility];  Service: Endoscopy;  Laterality: N/A;  colonoscopy aboerted due to formed stool and poor prep  . FOOT SURGERY    . Lower ext duplex doppler  03/14/14   Rt ABI 1.2  . LOWER EXTREMITY ANGIOGRAM Bilateral 02/18/2014   Procedure: LOWER EXTREMITY ANGIOGRAM;  Surgeon: [name], MD;  Location: [facility] CATH LAB;  Service: Cardiovascular;  Laterality: Bilateral;  . LOWER EXTREMITY ANGIOGRAM N/A 10/31/2014   Procedure: LOWER EXTREMITY ANGIOGRAM;  Surgeon: [name], MD;  Location: [facility] CATH LAB;  Service: Cardiovascular;  Laterality: N/A;  . Venia Minks DILATION N/A 10/11/2019   Procedure: Venia Minks DILATION;  Surgeon: [name], MD;  [facility];  Service: Endoscopy;  Laterality: N/A;  . MULTIPLE TOOTH EXTRACTIONS    . PV angiogram  02/18/2014   tibial vessel diseas bil.  [name] SPINE SURGERY    . tendon achillies lengthing and sesamoid      There were no vitals filed for this visit.               Wound Therapy - 02/28/20 1627    Subjective pt states no changes    Patient and Family Stated Goals to have wound healed    Date of Onset --   3-4 years ago   Prior Treatments wound therapy at 3 different clinics, debridement by MD.     Wound  Properties Date First Assessed: 02/19/20 Time First Assessed: 1300 Wound Type: Diabetic ulcer Location: Foot Location Orientation: Right;Other (Comment) Wound Description (Comments): plantar aspect at head of first metatarsal  Present on Admission: Yes   Dressing Type Impregnated gauze (bismuth)    Dressing Changed Changed    Dressing Status Old drainage;Other (Comment)    Dressing Change Frequency PRN    % Wound base Red or Granulating 75%    % Wound base Other/Granulation Tissue (Comment) 25%    Selective Debridement - Location plantar aspect of R foot    Selective Debridement - Tools Used Scalpel;Forceps    Selective Debridement - Tissue Removed devitalized tissue, callus    Wound Therapy - Clinical Statement Pt returns today with bandage in place.  Upon removal noted increased drainage/maceration perimeter of wound.  Able to debride approx. 49mm of callous from perimeter.  Changed dressing to alginate due to maceration/increased drainage.  Secured with medipore tape.  Encouraged pt to follow up on getting new diabetic shoes since it has been over one year.      Wound Therapy - Functional Problem List difficulty walking, bathing transfering     Factors Delaying/Impairing Wound Healing Diabetes Mellitus;Vascular compromise;Other (comment)    Wound Therapy - Frequency 2X / week    Wound Therapy - Current Recommendations PT    Wound Plan continue with woundcare using appropriate dressings to promote healing.    Dressing  alginate, 2x2, medipore                     PT Short Term Goals - 02/19/20 1540      PT SHORT TERM GOAL #1   Title Wound will measure no more than 2.0 cm^2  to demeonstrate decreased wound size    Time 4    Period Weeks    Status New    Target Date 03/18/20      PT SHORT TERM GOAL #2   Title Paitent will consistently wear diabetic shoes to decrease pressure on plantar surface of wound to allow it to heal.    Time 4    Period Weeks    Status New    Target  Date 03/18/20             PT Long Term Goals - 02/19/20 1541      PT LONG TERM GOAL #1   Time --    Period --    Status --    Target Date --      PT LONG TERM GOAL #2   Title Patient will be independent in self care techniques in regards to skin care and foot inspecation to decrease risk of additional wounds    Time 8    Period Weeks    Status New    Target Date 04/15/20      PT LONG TERM GOAL #3   Title Wound will  be completely healed to improve ability to ambulate    Time 8    Period Weeks    Target Date 04/15/20                  Patient will benefit from skilled therapeutic intervention in order to improve the following deficits and impairments:     Visit Diagnosis: Difficulty in walking, not elsewhere classified  Diabetic ulcer of right midfoot associated with diabetes mellitus due to underlying condition, limited to breakdown of skin [facility])     Problem List Patient Active Problem List   Diagnosis Date Noted  . Anemia 09/03/2019  . Reflux esophagitis 09/03/2019  . Esophageal dysphagia 05/30/2019  . Constipation 05/30/2019  . Positive colorectal cancer screening using Cologuard test 05/30/2019  . Diabetic foot ulcer ([place]) 09/06/2018  . Toe osteomyelitis, left ([place])   . Ulcer of great toe, left, with necrosis of bone ([place])   . Cellulitis of left foot 05/07/2018  . Orthostatic hypotension 08/25/2016  . Arrhythmia 05/20/2016  . Angina decubitus ([place]) 05/19/2016  . Congestive heart failure ([place]) 05/17/2016  . Pain in the chest 05/17/2016  . Pulmonary nodule 05/17/2016  . Elevated troponin 04/11/2016  . Nonhealing skin ulcer ([place]) 10/28/2014  . Bilateral carotid artery disease ([place]) 07/08/2014  . Critical lower limb ischemia 02/28/2014  . Obstructive sleep apnea 02/12/2014  . Aftercare following surgery of the circulatory system, [place] 12/04/2013  . Restless leg syndrome 10/23/2013  . Obesity (BMI 30.0-34.9) 10/23/2013  . Hyperlipidemia 07/16/2013   . Peripheral arterial disease ([place]) 07/16/2013  . History of stroke June 2013 03/14/2012  . Hypertension 03/14/2012  . Type 2 diabetes mellitus with circulatory disorder ([place]) 03/14/2012  . Peripheral neuropathy 03/14/2012  . Cardiomyopathy- EF NL 01/2013, now 30-35% echo 03/14/2012  . ARTHRITIS, RIGHT FOOT 06/26/2008   Teena Irani, PTA/CLT [phone]  Teena Irani 02/28/2020, 4:33 PM  [place], [place], [address] Phone: [phone]   Fax:  [phone]  Name: MACLANE HOLLORAN MRN: [id number] Date of Birth: 24-Aug-1946

## 2020-02-28 NOTE — Progress Notes (Signed)
Subjective:  Patient ID: [name], male    DOB: [birth year]  Age: 74 y.o. MRN: [id number]  CC:  Chief Complaint  Patient presents with  . New Patient (Initial Visit)    former dr [name] pt fell approximately 3 days ago went to er everything checked out left knee is still bothering him       HPI  HPI  [name] is a very pleasant and jovial 74 year old male patient who is here to establish care.  Previously patient of Dr. Luan [name].  Additionally he wants to mention that he is currently 3 days ago went to the emergency room but his knee is still bothering him a little bit.  He does have a cane.  He has a history that includes but is not limited to congestive heart failure, hypertension, obstructive sleep apnea, GERD, recent positive Cologuard test, restless leg syndrome, type 2 diabetes among others. He reports taking all his medications as directed.  But he ran out of his medication he was on for his diabetes and does not recall the name of it.  A1c was checked here in the office and it has gone up tremendously over the last 2 years to 10.3%.  But he reports that he knows he is not been eating like he should.  Such as eating to oatmeal cookies on the way to the office today as he was running behind.  He had a positive Cologuard.  Dr. Gala Romney would like to do an inpatient colonoscopy.  He is not against doing this.  He just like to wait for Covid to go down just a little bit more before he did it.  This was a discussion that was had back in March.  Needs to have updated eye exam he skipped it last year secondary to Covid.  Will need to have an updated urine microalbumin.  Reports he is not sure if he is ever been tested for hepatitis C.  Reports that he has had pneumonia vaccines before.  Will need to get previous records if possible.  Today he denies having any chest pain, leg swelling, palpitations, shortness of breath, cough.  Denies having any headaches, vision changes,  hearing changes with hearing.  Denies having any skin issues.  Reports that he sleeps okay.  Denies having any chewing or swallowing issues.  Reports that his appetite is good.  He is reports "probably too good".  Reports that he does not have any memory issues that he is aware of some occasional forgetfulness but overall good.  Denies having any falls outside of this recent fall.  He usually can catch himself.  Sometimes has a little bit of a hard time getting himself started after sitting for long period of time he gets stiff.  Denies having any incontinence issues.  Denies having any frank blood in urine or stool.  Today patient denies signs and symptoms of COVID 19 infection including fever, chills, cough, shortness of breath, and headache. Past Medical, Surgical, Social History, Allergies, and Medications have been Reviewed.   Past Medical History:  Diagnosis Date  . Aftercare following surgery of the circulatory system, [place] 12/04/2013  . Angina decubitus ([place]) 05/19/2016  . Arrhythmia 05/20/2016  . Arthritis   . ARTHRITIS, RIGHT FOOT 06/26/2008   Qualifier: Diagnosis of  By: Aline Brochure MD, Dorothyann Peng    . Cardiomyopathy- EF NL 01/2013, now 30-35% echo 03/14/2012  . Carotid artery occlusion    left s/p CEA  . Cellulitis of  left foot 05/07/2018  . CHF (congestive heart failure) ([place])   . Critical lower limb ischemia 02/28/2014   Critical limb ischemia   . Diabetes mellitus   . Elevated troponin 04/11/2016  . GERD (gastroesophageal reflux disease)   . History of stroke June 2013 03/14/2012  . HOH (hard of hearing)   . Hypertension   . Hypothyroidism   . Nonhealing skin ulcer ([place]) 10/28/2014  . Obstructive sleep apnea   . Orthostatic hypotension   . Osteomyelitis ([place])    left great toe  . Pain in the chest 05/17/2016  . Peripheral arterial disease ([place]), s/p PTA x 2 RLE    nonhealing ulcers bilaterally on each great toe  . Pneumonia   . PONV (postoperative nausea and vomiting)   . Pulmonary  nodule 05/17/2016  . Restless leg syndrome   . Shortness of breath   . Stroke [facility]) March 08, 2012  . Toe osteomyelitis, left ([place])   . Ulcer of great toe, left, with necrosis of bone ([place])   . Wears dentures     Current Meds  Medication Sig  . aspirin EC 81 MG EC tablet Take 1 tablet (81 mg total) by mouth daily. (Patient taking differently: Take 81 mg by mouth at bedtime. )  . b complex vitamins tablet Take 1 tablet by mouth daily.  . carvedilol (COREG) 3.125 MG tablet Take 1 tablet (3.125 mg total) by mouth 2 (two) times daily with a meal.  . Cholecalciferol (VITAMIN D3) 5000 units TABS Take 5,000 Units by mouth 2 (two) times daily.   . clopidogrel (PLAVIX) 75 MG tablet Take 75 mg by mouth daily.   . Continuous Blood Gluc Receiver (FREESTYLE LIBRE 14 DAY READER) DEVI   . Continuous Blood Gluc Sensor (FREESTYLE LIBRE 14 DAY SENSOR) MISC   . Cyanocobalamin ([employer] B-12) 1500 MCG TBDP Take 1,500 mcg by mouth daily.   . diclofenac Sodium (VOLTAREN) 1 % GEL Apply 2 g topically 4 (four) times daily as needed.  . DULoxetine (CYMBALTA) 60 MG capsule Take 60 mg by mouth daily.  . furosemide (LASIX) 40 MG tablet TAKE ONE TABLET (40MG  TOTAL) BY MOUTH TWO TIMES DAILY. (Patient taking differently: Take 40 mg by mouth 2 (two) times daily. )  . glimepiride (AMARYL) 4 MG tablet Take 4 mg by mouth daily with breakfast.  . levothyroxine (SYNTHROID, LEVOTHROID) 75 MCG tablet Take 75 mcg by mouth daily before breakfast.   . linaclotide (LINZESS) 290 MCG CAPS capsule Take 290 mcg by mouth daily before breakfast.  . Multiple Vitamin (MULTIVITAMIN WITH MINERALS) TABS tablet Take 1 tablet by mouth daily.  . nitroGLYCERIN (NITROSTAT) 0.4 MG SL tablet Place 1 tablet (0.4 mg total) under the tongue every 5 (five) minutes as needed for chest pain.  . pantoprazole (PROTONIX) 40 MG tablet Take 40 mg by mouth daily.  . potassium chloride SA (K-DUR,KLOR-CON) 20 MEQ tablet Take 1 tablet (20 mEq total) by mouth daily.  Please schedule appointment for refills (Patient taking differently: Take 20 mEq by mouth every other day. Please schedule appointment for refills)  . pramipexole (MIRAPEX) 0.25 MG tablet Take 2 tablets (0.5 mg total) by mouth 2 (two) times daily. Take 0.5mg  by mouth at 7 PM and 0.5mg  at 9 PM daily.  . rosuvastatin (CRESTOR) 20 MG tablet Take 20 mg by mouth daily.    ROS:  Review of Systems  Constitutional: Negative.   HENT: Negative.   Eyes: Negative.   Respiratory: Negative.   Cardiovascular: Negative.  Gastrointestinal: Negative.   Genitourinary: Negative.   Musculoskeletal: Positive for joint pain.  Skin: Negative.   Neurological: Negative.   Endo/Heme/Allergies: Negative.   Psychiatric/Behavioral: Negative.   All other systems reviewed and are negative.    Objective:   Today's Vitals: BP 119/68 (BP Location: Right Arm, Patient Position: Sitting, Cuff Size: Normal)   Pulse 79   Temp (!) 97.2 F (36.2 C) (Temporal)   Ht 6' (1.829 m)   Wt 218 lb 12.8 oz (99.2 kg)   SpO2 99%   BMI 29.67 kg/m  Vitals with BMI 02/28/2020 02/25/2020 02/25/2020  Height 6\' 0"  - 6\' 0"   Weight 218 lbs 13 oz - 217 lbs  BMI 58.85 - 02.77  Systolic 412 878 676  Diastolic 68 75 83  Pulse 79 71 83     Physical Exam Vitals and nursing note reviewed.  Constitutional:      Appearance: Normal appearance. He is well-developed, well-groomed and overweight.  HENT:     Head: Normocephalic and atraumatic.     Right Ear: External ear normal.     Left Ear: External ear normal.     Mouth/Throat:     Comments: Mask in place Eyes:     General:        Right eye: No discharge.        Left eye: No discharge.     Conjunctiva/sclera: Conjunctivae normal.     Comments: Glasses  Cardiovascular:     Rate and Rhythm: Normal rate and regular rhythm.     Pulses: Normal pulses.     Heart sounds: Normal heart sounds.  Pulmonary:     Effort: Pulmonary effort is normal.     Breath sounds: Normal breath sounds.    Musculoskeletal:        General: Normal range of motion.     Cervical back: Normal range of motion and neck supple.     Comments: Kasandra Knudsen present  Skin:    General: Skin is warm.  Neurological:     General: No focal deficit present.     Mental Status: He is alert and oriented to person, place, and time.  Psychiatric:        Attention and Perception: Attention and perception normal.        Mood and Affect: Mood and affect normal.        Speech: Speech normal.        Behavior: Behavior normal. Behavior is cooperative.        Thought Content: Thought content normal.        Judgment: Judgment normal.     Comments: Good communication, good eye contact     Assessment   1. Type 2 diabetes mellitus with other circulatory complication, without long-term current use of insulin ([place])   2. Congestive heart failure, unspecified HF chronicity, unspecified heart failure type ([place])   3. Essential hypertension   4. Mixed hyperlipidemia   5. Bilateral carotid artery stenosis   6. Obstructive sleep apnea   7. Gastroesophageal reflux disease with esophagitis, unspecified whether hemorrhage   8. Overweight with body mass index (BMI) of 29 to 29.9 in adult   9. Restless leg syndrome   10. Positive colorectal cancer screening using Cologuard test     Tests ordered Orders Placed This Encounter  Procedures  . CBC  . COMPLETE METABOLIC PANEL WITH GFR  . Lipid panel  . POCT glycosylated hemoglobin (Hb A1C)     Plan: Please see assessment and plan per problem  list above.   Meds ordered this encounter  Medications  . Empagliflozin-linaGLIPtin (GLYXAMBI) 10-5 MG TABS    Sig: Take 1 tablet by mouth daily.    Dispense:  90 tablet    Refill:  1    Order Specific Question:   Supervising Provider    Answer:   Jacklynn Bue    Patient to follow-up in 05/30/2020   Perlie Mayo, NP

## 2020-03-04 ENCOUNTER — Ambulatory Visit (HOSPITAL_COMMUNITY): Payer: PPO | Admitting: Physical Therapy

## 2020-03-04 ENCOUNTER — Other Ambulatory Visit: Payer: Self-pay

## 2020-03-04 ENCOUNTER — Encounter (HOSPITAL_COMMUNITY): Payer: Self-pay | Admitting: Physical Therapy

## 2020-03-04 DIAGNOSIS — R262 Difficulty in walking, not elsewhere classified: Secondary | ICD-10-CM

## 2020-03-04 DIAGNOSIS — L97411 Non-pressure chronic ulcer of right heel and midfoot limited to breakdown of skin: Secondary | ICD-10-CM

## 2020-03-04 DIAGNOSIS — E08621 Diabetes mellitus due to underlying condition with foot ulcer: Secondary | ICD-10-CM

## 2020-03-04 NOTE — Therapy (Addendum)
[place], [place], [address] Phone: [phone]   Fax:  [phone]  Wound Care Therapy  Patient Details  Name: [name] MRN: [id number] Date of Birth: [birth year] Referring Provider (PT): Hardie Pulley, DPM   Encounter Date: 03/04/2020     03/04/20 1242  PT Visits / Re-Eval  Visit Number 4  Number of Visits 16  Date for PT Re-Evaluation 04/15/20  Authorization  Authorization Type [employer] no VL no auth  Progress Note Due on Visit 10  PT Time Calculation  PT Start Time 1052  PT Stop Time 1120  PT Time Calculation (min) 28 min  PT - End of Session  Activity Tolerance Patient tolerated treatment well;No increased pain    Past Medical History:  Diagnosis Date  . Arthritis   . Cardiomyopathy- EF NL 01/2013, now 30-35% echo 03/14/2012  . Carotid artery occlusion    left s/p CEA  . CHF (congestive heart failure) ([place])   . Diabetes mellitus   . GERD (gastroesophageal reflux disease)   . HOH (hard of hearing)   . Hypertension   . Hypothyroidism   . Obstructive sleep apnea   . Orthostatic hypotension   . Osteomyelitis ([place])    left great toe  . Peripheral arterial disease ([place]), s/p PTA x 2 RLE    nonhealing ulcers bilaterally on each great toe  . Pneumonia   . PONV (postoperative nausea and vomiting)   . Restless leg syndrome   . Shortness of breath   . Stroke [facility]) March 08, 2012  . Wears dentures     Past Surgical History:  Procedure Laterality Date  . AMPUTATION TOE Left 07/25/2018   Procedure: AMPUTATION TOE INTERPHALANGEAL HALLUX LEFT;  Surgeon: Evelina Bucy, DPM;  Location: [facility];  Service: Podiatry;  Laterality: Left;  . ANGIOPLASTY  02/28/14   diamond back orbital rotational atherectomy of Rt. tibial  . BACK SURGERY    . BONE BIOPSY Left 07/25/2018   Procedure: SUPERFICIAL BONE BIOPSY;  Surgeon: Evelina Bucy, DPM;  Location: [facility];  Service: Podiatry;  Laterality:  Left;  . CARDIAC CATHETERIZATION N/A 05/19/2016   Procedure: Right/Left Heart Cath and Coronary Angiography;  Surgeon: Belva Crome, MD;  Location: [facility] CV LAB;  Service: Cardiovascular;  Laterality: N/A;  . CERVICAL FUSION    . ENDARTERECTOMY Left 11/29/2013   Procedure: ENDARTERECTOMY CAROTID;  Surgeon: Serafina Mitchell, MD;  Location: [facility] OR;  Service: Vascular;  Laterality: Left;  . ESOPHAGOGASTRODUODENOSCOPY  02/2010   Dr. Gala Romney: patient presented with food impaction, schatzki ring with superimposed component of stricture with erosive reflux esophagitis, s/p disimpaction but dilation planned at later date   . ESOPHAGOGASTRODUODENOSCOPY (EGD) WITH PROPOFOL N/A 08/13/2019   Dr. Gala Romney: Erosive reflux esophagitis with mild stricture and incidental Mallory-Weiss tear which precluded esophageal dilation.  Medium sized hiatal hernia.  [name] ESOPHAGOGASTRODUODENOSCOPY (EGD) WITH PROPOFOL N/A 10/11/2019   Dr. Gala Romney: Esophageal stenosis status post dilation, moderate hiatal hernia  . EYE SURGERY    . FLEXIBLE SIGMOIDOSCOPY N/A 08/13/2019   Procedure: FLEXIBLE SIGMOIDOSCOPY;  Surgeon: [name], MD;  [facility];  Service: Endoscopy;  Laterality: N/A;  colonoscopy aboerted due to formed stool and poor prep  . FOOT SURGERY    . Lower ext duplex doppler  03/14/14   Rt ABI 1.2  . LOWER EXTREMITY ANGIOGRAM Bilateral 02/18/2014   Procedure: LOWER EXTREMITY ANGIOGRAM;  Surgeon: [name], MD;  Location: [facility] CATH LAB;  Service: Cardiovascular;  Laterality: Bilateral;  . LOWER EXTREMITY ANGIOGRAM N/A 10/31/2014   Procedure: LOWER EXTREMITY ANGIOGRAM;  Surgeon: [name], MD;  Location: [facility] CATH LAB;  Service: Cardiovascular;  Laterality: N/A;  . Venia Minks DILATION N/A 10/11/2019   Procedure: Venia Minks DILATION;  Surgeon: [name], MD;  [facility];  Service: Endoscopy;  Laterality: N/A;  . MULTIPLE TOOTH EXTRACTIONS    . PV angiogram  02/18/2014   tibial vessel diseas  bil.  [name] SPINE SURGERY    . tendon achillies lengthing and sesamoid      There were no vitals filed for this visit.      [facility] PT Assessment - 03/04/20 0001      Assessment   Medical Diagnosis R midfoot diabetic ulcer    Referring Provider (PT) Hardie Pulley, DPM                   Wound Therapy - 03/04/20 0001    Subjective Dressing in place, no reports of pain in right leg just in left leg. States he is going to his MD about his left hip today.     Patient and Family Stated Goals to have wound healed    Date of Onset --   3-4 years ago   Prior Treatments wound therapy at 3 different clinics, debridement by MD.     Pain Scale 0-10    Wound Properties Date First Assessed: 02/19/20 Time First Assessed: 1300 Wound Type: Diabetic ulcer Location: Foot Location Orientation: Right;Other (Comment) Wound Description (Comments): plantar aspect at head of first metatarsal  Present on Admission: Yes   Dressing Type Alginate   2x2, medipore tape   Dressing Changed Changed    Dressing Status Old drainage    Dressing Change Frequency PRN    % Wound base Red or Granulating 75%    % Wound base Other/Granulation Tissue (Comment) 25%    Wound Length (cm) 2 cm   was 2   Wound Width (cm) 1.8 cm   was 1.4   Wound Depth (cm) 0.4 cm   was .6   Wound Volume (cm^3) 1.44 cm^3    Wound Surface Area (cm^2) 3.6 cm^2    Margins Unattached edges (unapproximated)    Drainage Amount Minimal    Drainage Description Serosanguineous    Selective Debridement - Location plantar aspect of R foot    Selective Debridement - Tools Used Scalpel;Forceps    Selective Debridement - Tissue Removed devitalized tissue, callus    Wound Therapy - Clinical Statement Wound decreasing in depth but overall size continues to increase in size secondary to debridement of surrounding callus. Expect wound size to continue to increase as we continue to debride callus. Increased drainage noted, continued with alginate and put  Vaseline surrounding wound bed. Will continue to focus on debridement and appropriate dressing changes as tolerated.     Wound Therapy - Functional Problem List difficulty walking, bathing transfering     Factors Delaying/Impairing Wound Healing Diabetes Mellitus;Vascular compromise;Other (comment)    Wound Therapy - Frequency 2X / week    Wound Therapy - Current Recommendations PT    Wound Plan continue with woundcare using appropriate dressings to promote healing.    Dressing  alginate, 2x2, medipore                     PT Short Term Goals - 02/19/20 1540      PT SHORT TERM  GOAL #1   Title Wound will measure no more than 2.0 cm^2  to demeonstrate decreased wound size    Time 4    Period Weeks    Status New    Target Date 03/18/20      PT SHORT TERM GOAL #2   Title Paitent will consistently wear diabetic shoes to decrease pressure on plantar surface of wound to allow it to heal.    Time 4    Period Weeks    Status New    Target Date 03/18/20             PT Long Term Goals - 02/19/20 1541      PT LONG TERM GOAL #1   Time --    Period --    Status --    Target Date --      PT LONG TERM GOAL #2   Title Patient will be independent in self care techniques in regards to skin care and foot inspecation to decrease risk of additional wounds    Time 8    Period Weeks    Status New    Target Date 04/15/20      PT LONG TERM GOAL #3   Title Wound will be completely healed to improve ability to ambulate    Time 8    Period Weeks    Target Date 04/15/20                  Patient will benefit from skilled therapeutic intervention in order to improve the following deficits and impairments:     Visit Diagnosis: Difficulty in walking, not elsewhere classified  Diabetic ulcer of right midfoot associated with diabetes mellitus due to underlying condition, limited to breakdown of skin [facility])     Problem List Patient Active Problem List   Diagnosis Date  Noted  . Anemia 09/03/2019  . Reflux esophagitis 09/03/2019  . Esophageal dysphagia 05/30/2019  . Constipation 05/30/2019  . Positive colorectal cancer screening using Cologuard test 05/30/2019  . Diabetic foot ulcer ([place]) 09/06/2018  . Toe osteomyelitis, left ([place])   . Ulcer of great toe, left, with necrosis of bone ([place])   . Cellulitis of left foot 05/07/2018  . Orthostatic hypotension 08/25/2016  . Arrhythmia 05/20/2016  . Angina decubitus ([place]) 05/19/2016  . Congestive heart failure ([place]) 05/17/2016  . Pain in the chest 05/17/2016  . Pulmonary nodule 05/17/2016  . Elevated troponin 04/11/2016  . Nonhealing skin ulcer ([place]) 10/28/2014  . Bilateral carotid artery disease ([place]) 07/08/2014  . Critical lower limb ischemia 02/28/2014  . Obstructive sleep apnea 02/12/2014  . Aftercare following surgery of the circulatory system, [place] 12/04/2013  . Restless leg syndrome 10/23/2013  . Obesity (BMI 30.0-34.9) 10/23/2013  . Hyperlipidemia 07/16/2013  . Peripheral arterial disease ([place]) 07/16/2013  . History of stroke June 2013 03/14/2012  . Hypertension 03/14/2012  . Type 2 diabetes mellitus with circulatory disorder ([place]) 03/14/2012  . Peripheral neuropathy 03/14/2012  . Cardiomyopathy- EF NL 01/2013, now 30-35% echo 03/14/2012  . ARTHRITIS, RIGHT FOOT 06/26/2008    12:42 PM, 03/04/20 [name], DPT Physical Therapy with [facility]  [phone] office  [place] [address] [place], [place], [address] Phone: [phone]   Fax:  [phone]  Name: JHEREMY BOGER MRN: [id number] Date of Birth: 10/09/1945

## 2020-03-05 ENCOUNTER — Encounter: Payer: Self-pay | Admitting: Family Medicine

## 2020-03-05 MED ORDER — GLYXAMBI 10-5 MG PO TABS: 1.0000 | ORAL_TABLET | Freq: Every day | ORAL | 1 refills | Status: DC

## 2020-03-05 NOTE — Assessment & Plan Note (Signed)
History of carotid artery disease status post elective left carotid enterectomy.  Still has some mild widely patent ICA on internal carotid arteries is followed by cardiology

## 2020-03-05 NOTE — Assessment & Plan Note (Signed)
Is followed closely by cardiology.  EF ranging from 30 to 35%.  Encouraged to continue all medications as directed. No signs or symptoms of fluid overload today on exam.

## 2020-03-05 NOTE — Assessment & Plan Note (Signed)
Type 2 diabetes.  Reports that he is out of his medication he will call back with the name the medication goal for this medication.  He has an elevated A1c today in the office.  Last A1c was in 2018 was 5.4 today 10.3.  He is encouraged to make sure he focuses on eating a better diet and starting his medication as soon as he can

## 2020-03-05 NOTE — Assessment & Plan Note (Signed)
Not currently wearing a CPAP.

## 2020-03-05 NOTE — Assessment & Plan Note (Signed)
Has lost weight.  We will be monitoring this currently is ranging back in the range.

## 2020-03-05 NOTE — Assessment & Plan Note (Signed)
Please refer to GI note.  Extensive conversation was provided for this.  Is not against having a colonoscopy but wanted to wait to Covid risk.

## 2020-03-05 NOTE — Assessment & Plan Note (Signed)
Is followed by GI.  Denies having any heartburn symptoms at this time.  Continue Protonix at this time.

## 2020-03-05 NOTE — Assessment & Plan Note (Signed)
Is on Crestor.  Continue heart healthy diet and Crestor.

## 2020-03-05 NOTE — Assessment & Plan Note (Signed)
Appears to be on Mirapex.  Followed by neurology.

## 2020-03-06 ENCOUNTER — Other Ambulatory Visit: Payer: Self-pay

## 2020-03-06 ENCOUNTER — Other Ambulatory Visit: Payer: Self-pay | Admitting: *Deleted

## 2020-03-06 ENCOUNTER — Encounter (HOSPITAL_COMMUNITY): Payer: Self-pay

## 2020-03-06 ENCOUNTER — Ambulatory Visit (HOSPITAL_COMMUNITY): Payer: PPO

## 2020-03-06 DIAGNOSIS — E08621 Diabetes mellitus due to underlying condition with foot ulcer: Secondary | ICD-10-CM

## 2020-03-06 DIAGNOSIS — R262 Difficulty in walking, not elsewhere classified: Secondary | ICD-10-CM

## 2020-03-06 DIAGNOSIS — L97411 Non-pressure chronic ulcer of right heel and midfoot limited to breakdown of skin: Secondary | ICD-10-CM

## 2020-03-06 MED ORDER — EMPAGLIFLOZIN 25 MG PO TABS: 25.0000 mg | ORAL_TABLET | Freq: Every day | ORAL | 0 refills | Status: DC

## 2020-03-06 NOTE — Therapy (Addendum)
[facility] [place], [place], [address] Phone: [phone]   Fax:  [phone]  Wound Care Therapy and Discharge Note  Patient Details  Name: [name] MRN: [id number] Date of Birth: [birth year] Referring Provider (PT): Hardie Pulley, DPM   PHYSICAL THERAPY DISCHARGE SUMMARY  Visits from Start of Care: 5  Current functional level related to goals / functional outcomes: Unable to assess due to unplanned discharge  Remaining deficits: Unable to assess due to unplanned discharge   Education / Equipment: See below  Plan: Patient agrees to discharge.  Patient goals were not met. Patient is being discharged due to not returning since the last visit.  ?????      Patient having difficulty walking and wanted to focus on addressing knee pain prior to continuing PT for his wound.  7:57 AM, 06/11/20 [name], DPT Physical Therapy with [facility]  [phone] office   Encounter Date: 03/06/2020   PT End of Session - 03/06/20 1101    Visit Number 5    Number of Visits 16    Date for PT Re-Evaluation 04/15/20    Authorization Type [employer] no VL no auth    Progress Note Due on Visit 10    PT Start Time 1010    PT Stop Time 1050    PT Time Calculation (min) 40 min    Activity Tolerance Patient tolerated treatment well;No increased pain    Behavior During Therapy WFL for tasks assessed/performed           Past Medical History:  Diagnosis Date  . Aftercare following surgery of the circulatory system, [place] 12/04/2013  . Angina decubitus ([place]) 05/19/2016  . Arrhythmia 05/20/2016  . Arthritis   . ARTHRITIS, RIGHT FOOT 06/26/2008   Qualifier: Diagnosis of  By: Aline Brochure MD, Dorothyann Peng    . Cardiomyopathy- EF NL 01/2013, now 30-35% echo 03/14/2012  . Carotid artery occlusion    left s/p CEA  . Cellulitis of left foot 05/07/2018  . CHF (congestive heart failure) ([place])   . Critical  lower limb ischemia 02/28/2014   Critical limb ischemia   . Diabetes mellitus   . Elevated troponin 04/11/2016  . GERD (gastroesophageal reflux disease)   . History of stroke June 2013 03/14/2012  . HOH (hard of hearing)   . Hypertension   . Hypothyroidism   . Nonhealing skin ulcer ([place]) 10/28/2014  . Obstructive sleep apnea   . Orthostatic hypotension   . Osteomyelitis ([place])    left great toe  . Pain in the chest 05/17/2016  . Peripheral arterial disease ([place]), s/p PTA x 2 RLE    nonhealing ulcers bilaterally on each great toe  . Pneumonia   . PONV (postoperative nausea and vomiting)   . Pulmonary nodule 05/17/2016  . Restless leg syndrome   . Shortness of breath   . Stroke [facility]) March 08, 2012  . Toe osteomyelitis, left ([place])   . Ulcer of great toe, left, with necrosis of bone ([place])   . Wears dentures     Past Surgical History:  Procedure Laterality Date  . AMPUTATION TOE Left 07/25/2018   Procedure: AMPUTATION TOE INTERPHALANGEAL HALLUX LEFT;  Surgeon: Evelina Bucy, DPM;  Location: [place];  Service: Podiatry;  Laterality: Left;  . ANGIOPLASTY  02/28/14   diamond back orbital rotational atherectomy of Rt. tibial  . BACK SURGERY    . BONE BIOPSY Left 07/25/2018   Procedure: SUPERFICIAL BONE BIOPSY;  Surgeon: Evelina Bucy, DPM;  Location: [place];  Service: Podiatry;  Laterality: Left;  . CARDIAC CATHETERIZATION N/A 05/19/2016   Procedure: Right/Left Heart Cath and Coronary Angiography;  Surgeon: Belva Crome, MD;  Location: [place] CV LAB;  Service: Cardiovascular;  Laterality: N/A;  . CERVICAL FUSION    . ENDARTERECTOMY Left 11/29/2013   Procedure: ENDARTERECTOMY CAROTID;  Surgeon: Serafina Mitchell, MD;  Location: [facility] OR;  Service: Vascular;  Laterality: Left;  . ESOPHAGOGASTRODUODENOSCOPY  02/2010   Dr. Gala Romney: patient presented with food impaction, schatzki ring with superimposed component of stricture with erosive reflux esophagitis, s/p disimpaction but dilation planned at  later date   . ESOPHAGOGASTRODUODENOSCOPY (EGD) WITH PROPOFOL N/A 08/13/2019   Dr. Gala Romney: Erosive reflux esophagitis with mild stricture and incidental Mallory-Weiss tear which precluded esophageal dilation.  Medium sized hiatal hernia.  [name] ESOPHAGOGASTRODUODENOSCOPY (EGD) WITH PROPOFOL N/A 10/11/2019   Dr. Gala Romney: Esophageal stenosis status post dilation, moderate hiatal hernia  . EYE SURGERY    . FLEXIBLE SIGMOIDOSCOPY N/A 08/13/2019   Procedure: FLEXIBLE SIGMOIDOSCOPY;  Surgeon: [name], MD;  [facility];  Service: Endoscopy;  Laterality: N/A;  colonoscopy aboerted due to formed stool and poor prep  . FOOT SURGERY    . Lower ext duplex doppler  03/14/14   Rt ABI 1.2  . LOWER EXTREMITY ANGIOGRAM Bilateral 02/18/2014   Procedure: LOWER EXTREMITY ANGIOGRAM;  Surgeon: [name], MD;  Location: [facility] CATH LAB;  Service: Cardiovascular;  Laterality: Bilateral;  . LOWER EXTREMITY ANGIOGRAM N/A 10/31/2014   Procedure: LOWER EXTREMITY ANGIOGRAM;  Surgeon: [name], MD;  Location: [facility] [place] CATH LAB;  Service: Cardiovascular;  Laterality: N/A;  . Venia Minks DILATION N/A 10/11/2019   Procedure: Venia Minks DILATION;  Surgeon: [name], MD;  [facility];  Service: Endoscopy;  Laterality: N/A;  . MULTIPLE TOOTH EXTRACTIONS    . PV angiogram  02/18/2014   tibial vessel diseas bil.  [name] SPINE SURGERY    . tendon achillies lengthing and sesamoid      There were no vitals filed for this visit.    Subjective Assessment - 03/06/20 1056    Subjective Pt stated he is feeling good today, no reports of pain.    Currently in Pain? No/denies                     Wound Therapy - 03/06/20 0001    Subjective Pt stated he is feeling good today, no reports of pain.    Patient and Family Stated Goals to have wound healed    Date of Onset --   3-4 years ago   Prior Treatments wound therapy at 3 different clinics, debridement by MD.     Pain Scale 0-10    Pain Score 0-No  pain    Evaluation and Treatment Procedures Explained to Patient/Family Yes    Evaluation and Treatment Procedures agreed to    Wound Properties Date First Assessed: 02/19/20 Time First Assessed: 1300 Wound Type: Diabetic ulcer Location: Foot Location Orientation: Right;Other (Comment) Wound Description (Comments): plantar aspect at head of first metatarsal  Present on Admission: Yes   Dressing Type Alginate   2x2 and medipore tape   Dressing Changed Changed    Dressing Status Old drainage    Dressing Change Frequency PRN    Site / Wound Assessment Pink;Red;Dry;Dusky    % Wound base Red or Granulating 75%    % Wound base Other/Granulation Tissue (Comment) 25%  callus perimeter of wound   Peri-wound Assessment Intact   callus perimeter of wound   Margins Unattached edges (unapproximated)    Drainage Amount Minimal    Drainage Description Serosanguineous    Treatment Cleansed;Debridement (Selective)    Selective Debridement - Location plantar aspect of R foot    Selective Debridement - Tools Used Scalpel;Forceps    Selective Debridement - Tissue Removed devitalized tissue, callus    Wound Therapy - Clinical Statement Selective debridment for removal of callous perimeter of wound to assist with healing.  Anticipate wound to increase in size and callous is debrided.  No reports of pain through session.    Wound Therapy - Functional Problem List difficulty walking, bathing transfering     Factors Delaying/Impairing Wound Healing Diabetes Mellitus;Vascular compromise;Other (comment)    Wound Therapy - Frequency 2X / week    Wound Therapy - Current Recommendations PT    Wound Plan continue with woundcare using appropriate dressings to promote healing.    Dressing  alginate, 2x2, medipore                     PT Short Term Goals - 02/19/20 1540      PT SHORT TERM GOAL #1   Title Wound will measure no more than 2.0 cm^2  to demeonstrate decreased wound size    Time 4    Period  Weeks    Status New    Target Date 03/18/20      PT SHORT TERM GOAL #2   Title Paitent will consistently wear diabetic shoes to decrease pressure on plantar surface of wound to allow it to heal.    Time 4    Period Weeks    Status New    Target Date 03/18/20             PT Long Term Goals - 02/19/20 1541      PT LONG TERM GOAL #1   Time --    Period --    Status --    Target Date --      PT LONG TERM GOAL #2   Title Patient will be independent in self care techniques in regards to skin care and foot inspecation to decrease risk of additional wounds    Time 8    Period Weeks    Status New    Target Date 04/15/20      PT LONG TERM GOAL #3   Title Wound will be completely healed to improve ability to ambulate    Time 8    Period Weeks    Target Date 04/15/20                  Patient will benefit from skilled therapeutic intervention in order to improve the following deficits and impairments:     Visit Diagnosis: Difficulty in walking, not elsewhere classified  Diabetic ulcer of right midfoot associated with diabetes mellitus due to underlying condition, limited to breakdown of skin [facility])     Problem List Patient Active Problem List   Diagnosis Date Noted  . Anemia 09/03/2019  . Reflux esophagitis 09/03/2019  . Esophageal dysphagia 05/30/2019  . Constipation 05/30/2019  . Positive colorectal cancer screening using Cologuard test 05/30/2019  . Diabetic foot ulcer (HCC) 09/06/2018  . Congestive heart failure (HCC) 05/17/2016  . Bilateral carotid artery disease (HCC) 07/08/2014  . Obstructive sleep apnea 02/12/2014  . Restless leg syndrome 10/23/2013  . Overweight with body mass index (BMI) of  29 to 29.9 in adult 10/23/2013  . Hyperlipidemia 07/16/2013  . Peripheral arterial disease ([place]) 07/16/2013  . Hypertension 03/14/2012  . Type 2 diabetes mellitus with circulatory disorder ([place]) 03/14/2012  . Peripheral neuropathy 03/14/2012  .  Cardiomyopathy- EF NL 01/2013, now 30-35% echo 03/14/2012   [name], LPTA/CLT; CBIS [phone]  [name] 03/06/2020, 11:01 AM  [facility] [place], [place], [address] Phone: [phone]   Fax:  [phone]  Name: RENO CLASBY MRN: [id number] Date of Birth: December 24, 1945

## 2020-03-11 ENCOUNTER — Ambulatory Visit (HOSPITAL_COMMUNITY): Payer: PPO

## 2020-03-11 ENCOUNTER — Telehealth (HOSPITAL_COMMUNITY): Payer: Self-pay | Admitting: Physical Therapy

## 2020-03-11 NOTE — Telephone Encounter (Signed)
pt is not feeling his best his stability is off.called to cx.

## 2020-03-13 ENCOUNTER — Other Ambulatory Visit: Payer: Self-pay

## 2020-03-13 ENCOUNTER — Telehealth (HOSPITAL_COMMUNITY): Payer: Self-pay | Admitting: Physical Therapy

## 2020-03-13 ENCOUNTER — Emergency Department (HOSPITAL_COMMUNITY): Payer: PPO

## 2020-03-13 ENCOUNTER — Ambulatory Visit (HOSPITAL_COMMUNITY): Payer: PPO | Admitting: Physical Therapy

## 2020-03-13 ENCOUNTER — Ambulatory Visit (INDEPENDENT_AMBULATORY_CARE_PROVIDER_SITE_OTHER): Payer: PPO | Admitting: Family Medicine

## 2020-03-13 ENCOUNTER — Ambulatory Visit: Payer: PPO | Admitting: Family Medicine

## 2020-03-13 ENCOUNTER — Encounter (HOSPITAL_COMMUNITY): Payer: Self-pay | Admitting: *Deleted

## 2020-03-13 ENCOUNTER — Emergency Department (HOSPITAL_COMMUNITY)
Admission: EM | Admit: 2020-03-13 | Discharge: 2020-03-13 | Disposition: A | Discharge status: Home / Self Care | Payer: PPO | Attending: Emergency Medicine | Admitting: Emergency Medicine

## 2020-03-13 ENCOUNTER — Encounter: Payer: Self-pay | Admitting: Family Medicine

## 2020-03-13 VITALS — BP 115/72 | HR 73 | Temp 97.2°F | Ht 72.0 in | Wt 218.0 lb

## 2020-03-13 DIAGNOSIS — Z87891 Personal history of nicotine dependence: Secondary | ICD-10-CM | POA: Diagnosis not present

## 2020-03-13 DIAGNOSIS — E1140 Type 2 diabetes mellitus with diabetic neuropathy, unspecified: Secondary | ICD-10-CM | POA: Insufficient documentation

## 2020-03-13 DIAGNOSIS — Y999 Unspecified external cause status: Secondary | ICD-10-CM | POA: Insufficient documentation

## 2020-03-13 DIAGNOSIS — Y939 Activity, unspecified: Secondary | ICD-10-CM | POA: Diagnosis not present

## 2020-03-13 DIAGNOSIS — Y92019 Unspecified place in single-family (private) house as the place of occurrence of the external cause: Secondary | ICD-10-CM | POA: Insufficient documentation

## 2020-03-13 DIAGNOSIS — E785 Hyperlipidemia, unspecified: Secondary | ICD-10-CM | POA: Insufficient documentation

## 2020-03-13 DIAGNOSIS — E11621 Type 2 diabetes mellitus with foot ulcer: Secondary | ICD-10-CM | POA: Diagnosis not present

## 2020-03-13 DIAGNOSIS — M5134 Other intervertebral disc degeneration, thoracic region: Secondary | ICD-10-CM | POA: Diagnosis not present

## 2020-03-13 DIAGNOSIS — M545 Low back pain: Secondary | ICD-10-CM | POA: Diagnosis not present

## 2020-03-13 DIAGNOSIS — I10 Essential (primary) hypertension: Secondary | ICD-10-CM | POA: Diagnosis not present

## 2020-03-13 DIAGNOSIS — L97509 Non-pressure chronic ulcer of other part of unspecified foot with unspecified severity: Secondary | ICD-10-CM | POA: Diagnosis not present

## 2020-03-13 DIAGNOSIS — Z79899 Other long term (current) drug therapy: Secondary | ICD-10-CM | POA: Insufficient documentation

## 2020-03-13 DIAGNOSIS — W19XXXA Unspecified fall, initial encounter: Secondary | ICD-10-CM

## 2020-03-13 DIAGNOSIS — M5136 Other intervertebral disc degeneration, lumbar region: Secondary | ICD-10-CM | POA: Diagnosis not present

## 2020-03-13 DIAGNOSIS — M549 Dorsalgia, unspecified: Secondary | ICD-10-CM | POA: Insufficient documentation

## 2020-03-13 DIAGNOSIS — M25562 Pain in left knee: Secondary | ICD-10-CM

## 2020-03-13 DIAGNOSIS — I739 Peripheral vascular disease, unspecified: Secondary | ICD-10-CM | POA: Insufficient documentation

## 2020-03-13 DIAGNOSIS — M25552 Pain in left hip: Secondary | ICD-10-CM | POA: Insufficient documentation

## 2020-03-13 DIAGNOSIS — M2578 Osteophyte, vertebrae: Secondary | ICD-10-CM | POA: Diagnosis not present

## 2020-03-13 DIAGNOSIS — Z7982 Long term (current) use of aspirin: Secondary | ICD-10-CM | POA: Insufficient documentation

## 2020-03-13 DIAGNOSIS — M79662 Pain in left lower leg: Secondary | ICD-10-CM | POA: Diagnosis present

## 2020-03-13 DIAGNOSIS — Y92009 Unspecified place in unspecified non-institutional (private) residence as the place of occurrence of the external cause: Secondary | ICD-10-CM | POA: Diagnosis not present

## 2020-03-13 DIAGNOSIS — R296 Repeated falls: Secondary | ICD-10-CM | POA: Diagnosis not present

## 2020-03-13 MED ORDER — LIDOCAINE 5 % EX PTCH: 1.0000 | MEDICATED_PATCH | CUTANEOUS | 0 refills | Status: DC

## 2020-03-13 MED ORDER — LIDOCAINE 5 % EX PTCH
1.0000 | MEDICATED_PATCH | CUTANEOUS | Status: DC
  Administered 2020-03-13: 1 via TRANSDERMAL
  Filled 2020-03-13: qty 1

## 2020-03-13 NOTE — ED Provider Notes (Signed)
[facility] Provider Note   CSN: [id number] Arrival date & time: 03/13/20  1457     History Chief Complaint  Patient presents with  . Leg Pain    [name] is a 74 y.o. male.  74 year old male presents with left leg weakness.  Patient states that he had a fall about 1 month ago where his left leg gave out on him causing him to fall hitting his knees on the ground before hitting the front of his body on the ground.  States that he did not hit his head because the brim of his had prevented that from happening, no loss of consciousness.  Patient states since that time he has had pain in his left hip with weakness in the left leg.  Patient came to the ER on June 7, had an x-ray of the hip that was unremarkable.  Patient went to his PCP today due to worsening left leg weakness with increase in falls, states that he is fallen 4 times this week, denies any injuries as result of the fall and has not hit his head with these falls.  Patient states that when he walks up stairs he has to lead with his right leg as his left leg is too weak to climb the stair.  Patient is ambulatory with a cane and a walker at times.  Patient lives in a [place] home.        Past Medical History:  Diagnosis Date  . Aftercare following surgery of the circulatory system, [place] 12/04/2013  . Angina decubitus ([place]) 05/19/2016  . Arrhythmia 05/20/2016  . Arthritis   . ARTHRITIS, RIGHT FOOT 06/26/2008   Qualifier: Diagnosis of  By: Aline Brochure MD, Dorothyann Peng    . Cardiomyopathy- EF NL 01/2013, now 30-35% echo 03/14/2012  . Carotid artery occlusion    left s/p CEA  . Cellulitis of left foot 05/07/2018  . CHF (congestive heart failure) ([place])   . Critical lower limb ischemia 02/28/2014   Critical limb ischemia   . Diabetes mellitus   . Elevated troponin 04/11/2016  . GERD (gastroesophageal reflux disease)   . History of stroke June 2013 03/14/2012  . HOH (hard of hearing)   . Hypertension   .  Hypothyroidism   . Nonhealing skin ulcer ([place]) 10/28/2014  . Obstructive sleep apnea   . Orthostatic hypotension   . Osteomyelitis ([place])    left great toe  . Pain in the chest 05/17/2016  . Peripheral arterial disease ([place]), s/p PTA x 2 RLE    nonhealing ulcers bilaterally on each great toe  . Pneumonia   . PONV (postoperative nausea and vomiting)   . Pulmonary nodule 05/17/2016  . Restless leg syndrome   . Shortness of breath   . Stroke [facility]) March 08, 2012  . Toe osteomyelitis, left ([place])   . Ulcer of great toe, left, with necrosis of bone ([place])   . Wears dentures     Patient Active Problem List   Diagnosis Date Noted  . Fall at home, initial encounter 03/13/2020  . Anemia 09/03/2019  . Reflux esophagitis 09/03/2019  . Esophageal dysphagia 05/30/2019  . Constipation 05/30/2019  . Positive colorectal cancer screening using Cologuard test 05/30/2019  . Diabetic foot ulcer ([place]) 09/06/2018  . Congestive heart failure ([place]) 05/17/2016  . Bilateral carotid artery disease ([place]) 07/08/2014  . Obstructive sleep apnea 02/12/2014  . Restless leg syndrome 10/23/2013  . Overweight with body mass index (BMI) of 29 to 29.9 in  adult 10/23/2013  . Hyperlipidemia 07/16/2013  . Peripheral arterial disease ([place]) 07/16/2013  . Hypertension 03/14/2012  . Type 2 diabetes mellitus with circulatory disorder ([place]) 03/14/2012  . Peripheral neuropathy 03/14/2012  . Cardiomyopathy- EF NL 01/2013, now 30-35% echo 03/14/2012    Past Surgical History:  Procedure Laterality Date  . AMPUTATION TOE Left 07/25/2018   Procedure: AMPUTATION TOE INTERPHALANGEAL HALLUX LEFT;  Surgeon: Evelina Bucy, DPM;  Location: [place];  Service: Podiatry;  Laterality: Left;  . ANGIOPLASTY  02/28/14   diamond back orbital rotational atherectomy of Rt. tibial  . BACK SURGERY    . BONE BIOPSY Left 07/25/2018   Procedure: SUPERFICIAL BONE BIOPSY;  Surgeon: Evelina Bucy, DPM;  Location: [place];  Service: Podiatry;   Laterality: Left;  . CARDIAC CATHETERIZATION N/A 05/19/2016   Procedure: Right/Left Heart Cath and Coronary Angiography;  Surgeon: Belva Crome, MD;  Location: [place] [facility] CV LAB;  Service: Cardiovascular;  Laterality: N/A;  . CERVICAL FUSION    . ENDARTERECTOMY Left 11/29/2013   Procedure: ENDARTERECTOMY CAROTID;  Surgeon: Serafina Mitchell, MD;  Location: [facility] OR;  Service: Vascular;  Laterality: Left;  . ESOPHAGOGASTRODUODENOSCOPY  02/2010   Dr. Gala Romney: patient presented with food impaction, schatzki ring with superimposed component of stricture with erosive reflux esophagitis, s/p disimpaction but dilation planned at later date   . ESOPHAGOGASTRODUODENOSCOPY (EGD) WITH PROPOFOL N/A 08/13/2019   Dr. Gala Romney: Erosive reflux esophagitis with mild stricture and incidental Mallory-Weiss tear which precluded esophageal dilation.  Medium sized hiatal hernia.  [name] ESOPHAGOGASTRODUODENOSCOPY (EGD) WITH PROPOFOL N/A 10/11/2019   Dr. Gala Romney: Esophageal stenosis status post dilation, moderate hiatal hernia  . EYE SURGERY    . FLEXIBLE SIGMOIDOSCOPY N/A 08/13/2019   Procedure: FLEXIBLE SIGMOIDOSCOPY;  Surgeon: [name], MD;  [facility];  Service: Endoscopy;  Laterality: N/A;  colonoscopy aboerted due to formed stool and poor prep  . FOOT SURGERY    . Lower ext duplex doppler  03/14/14   Rt ABI 1.2  . LOWER EXTREMITY ANGIOGRAM Bilateral 02/18/2014   Procedure: LOWER EXTREMITY ANGIOGRAM;  Surgeon: [name], MD;  Location: [facility] CATH LAB;  Service: Cardiovascular;  Laterality: Bilateral;  . LOWER EXTREMITY ANGIOGRAM N/A 10/31/2014   Procedure: LOWER EXTREMITY ANGIOGRAM;  Surgeon: [name], MD;  Location[facility] C[facility];  Service: Cardiovascular;  Laterality: N/A;  . Venia Minks DILATION N/A 10/11/2019   Procedure: Venia Minks DILATION;  Surgeon: [name], MD;  [facility];  Service: Endoscopy;  Laterality: N/A;  . MULTIPLE TOOTH EXTRACTIONS    . PV angiogram  02/18/2014   tibial  vessel diseas bil.  [name] SPINE SURGERY    . tendon achillies lengthing and sesamoid         Family History  Problem Relation Age of Onset  . Heart disease Mother   . Hypertension Mother   . Heart attack Mother   . Hypertension Father   . Diabetes Father   . Diabetes Son   . Heart disease Son   . Hypertension Son   . Colon cancer Neg Hx     Social History   Tobacco Use  . Smoking status: Former Smoker    Years: 1.00    Types: Pipe    Quit date: 07/08/1977    Years since quitting: 42.7  . Smokeless tobacco: Never Used  Vaping Use  . Vaping Use: Never used  Substance Use Topics  . Alcohol use: No    Alcohol/week: 0.0  standard drinks  . Drug use: No    Home Medications Prior to Admission medications   Medication Sig Start Date End Date Taking? Authorizing Provider  aspirin EC 81 MG EC tablet Take 1 tablet (81 mg total) by mouth daily. Patient taking differently: Take 81 mg by mouth at bedtime.  05/21/16   Barrett, Evelene Croon, PA-C  b complex vitamins tablet Take 1 tablet by mouth daily.    [provider]  carvedilol (COREG) 3.125 MG tablet Take 1 tablet (3.125 mg total) by mouth 2 (two) times daily with a meal. 12/07/19   [name], MD  Cholecalciferol (VITAMIN D3) 5000 units TABS Take 5,000 Units by mouth 2 (two) times daily.     [provider]  clopidogrel (PLAVIX) 75 MG tablet Take 75 mg by mouth daily.  06/16/13   [provider]  Continuous Blood Gluc Receiver (FREESTYLE LIBRE 14 DAY READER) [place]  12/07/19   [provider]  Continuous Blood Gluc Sensor (FREESTYLE LIBRE 14 DAY SENSOR) MISC  09/11/18   [provider]  Cyanocobalamin ([employer] B-12) 1500 MCG TBDP Take 1,500 mcg by mouth daily.     [provider]  diclofenac Sodium (VOLTAREN) 1 % GEL Apply 2 g topically 4 (four) times daily as needed. 02/25/20   Long, Wonda Olds, MD  DULoxetine (CYMBALTA) 60 MG capsule Take 60 mg by mouth daily. 08/04/19   [provider]  empagliflozin (JARDIANCE) 25 MG TABS tablet Take 1 tablet (25 mg total) by mouth daily before breakfast. Patient not taking: Reported on 03/13/2020 03/06/20   Perlie Mayo, NP  Empagliflozin-linaGLIPtin (GLYXAMBI) 10-5 MG TABS Take 1 tablet by mouth daily. 03/05/20   Perlie Mayo, NP  furosemide (LASIX) 40 MG tablet TAKE ONE TABLET (40MG  TOTAL) BY MOUTH TWO TIMES DAILY. Patient taking differently: Take 40 mg by mouth 2 (two) times daily.  07/11/19   [name], MD  glimepiride (AMARYL) 4 MG tablet Take 4 mg by mouth daily with breakfast.    [provider]  levothyroxine (SYNTHROID, LEVOTHROID) 75 MCG tablet Take 75 mcg by mouth daily before breakfast.  02/14/18   [provider]  linaclotide (LINZESS) 290 MCG CAPS capsule Take 290 mcg by mouth daily before breakfast.    [provider]  Multiple Vitamin (MULTIVITAMIN WITH MINERALS) TABS tablet Take 1 tablet by mouth daily.    [provider]  nitroGLYCERIN (NITROSTAT) 0.4 MG SL tablet Place 1 tablet (0.4 mg total) under the tongue every 5 (five) minutes as needed for chest pain. 05/22/16   Barrett, Evelene Croon, PA-C  pantoprazole (PROTONIX) 40 MG tablet Take 40 mg by mouth daily. 08/15/19   [provider]  potassium chloride SA (K-DUR,KLOR-CON) 20 MEQ tablet Take 1 tablet (20 mEq total) by mouth daily. Please schedule appointment for refills Patient taking differently: Take 20 mEq by mouth every other day. Please schedule appointment for refills 06/14/18   [name], MD  pramipexole (MIRAPEX) 0.25 MG tablet Take 2 tablets (0.5 mg total) by mouth 2 (two) times daily. Take 0.5mg  by mouth at 7 PM and 0.5mg  at 9 PM daily. 02/25/20   Ward Givens, NP  rosuvastatin (CRESTOR) 20 MG tablet Take 20 mg by mouth daily.    [provider]    Allergies    Codeine and Tramadol  Review of Systems   Review of Systems  Constitutional: Negative for fever.  Gastrointestinal:  Negative for abdominal pain, constipation and diarrhea.  Genitourinary: Negative for decreased urine volume and difficulty urinating.  Musculoskeletal: Positive for arthralgias, back pain and gait problem.  Skin: Negative for rash and wound.  Neurological: Positive for weakness and numbness.  All other systems reviewed and are negative.   Physical Exam Updated Vital Signs BP (!) 151/74   Pulse 72   Temp 98.3 F (36.8 C)   Resp 18   Ht 6' (1.829 m)   Wt 98.4 kg   SpO2 99%   BMI 29.43 kg/m   Physical Exam Vitals and nursing note reviewed.  Constitutional:      General: He is not in acute distress.    Appearance: He is well-developed. He is not diaphoretic.  HENT:     Head: Normocephalic and atraumatic.  Cardiovascular:     Pulses: Normal pulses.  Pulmonary:     Effort: Pulmonary effort is normal.  Abdominal:     Palpations: Abdomen is soft.     Tenderness: There is no abdominal tenderness.  Musculoskeletal:        General: Tenderness present. No swelling or deformity.  Skin:    General: Skin is warm and dry.     Findings: No erythema or rash.  Neurological:     Mental Status: He is alert and oriented to person, place, and time.     Deep Tendon Reflexes: Babinski sign absent on the right side. Babinski sign absent on the left side.     Reflex Scores:      Patellar reflexes are 1+ on the right side and 1+ on the left side.      Achilles reflexes are 1+ on the right side and 1+ on the left side.    Comments: Diminished sensation to lateral left leg (upper and lower). Knee brace on left knee. No calf swelling or tenderness. Pain with left hip flexion/SLR, no pain with abduction or adduction. Patient is able to straight leg raise to flex hip and knee.   Psychiatric:        Behavior: Behavior normal.     ED Results / Procedures / Treatments   Labs (all labs ordered are listed, but only abnormal results are displayed) Labs Reviewed - No data to  display  EKG None  Radiology MR THORACIC SPINE WO CONTRAST  Result Date: 03/13/2020 CLINICAL DATA:  Back pain and left body pain. Multiple recent falls. EXAM: MRI THORACIC SPINE WITHOUT CONTRAST TECHNIQUE: Multiplanar, multisequence MR imaging of the thoracic spine was performed. No intravenous contrast was administered. COMPARISON:  Lumbar MRI dated 03/13/2020 FINDINGS: There is motion degradation of the axial images but I feel the study is diagnostic. Alignment:  Physiologic. Vertebrae: No fracture, evidence of discitis, or bone lesion. Anterior osteophytes appear to fuse the T10-11, T11-12 and T12-L1 levels. Cord:  Normal signal and morphology. Paraspinal and other soft tissues: Negative. Disc levels: The discs are degenerated throughout the thoracic spine but there are no significant disc bulges or disc protrusions. There is no spinal foraminal stenosis. No significant facet arthritis. No fractures. No soft tissue or bone edema of significance. IMPRESSION: No significant abnormality of the thoracic spine. Electronically Signed   By: Lorriane Shire M.D.   On: 03/13/2020 18:38   MR Lumbar Spine Wo Contrast  Result Date: 03/13/2020 CLINICAL DATA:  Low back pain. Multiple recent falls. Left body pain. Progressive neurological deficit. EXAM: MRI LUMBAR SPINE WITHOUT CONTRAST TECHNIQUE: Multiplanar, multisequence MR imaging of the lumbar spine was performed. No intravenous contrast was administered. COMPARISON:  Lumbar MRI dated  02/04/2012 and lumbar radiographs dated 09/22/2009 FINDINGS: Segmentation:  5 lumbar type vertebra. Alignment: Chronic minimal retrolisthesis of L1 on L2 and of L2 on L3. Vertebrae:  No fracture, evidence of discitis, or bone lesion. Conus medullaris and cauda equina: Conus extends to the L1-2 level. Conus and cauda equina appear normal. Paraspinal and other soft tissues: No significant abnormality. Benign-appearing 2.7 cm cyst on the left kidney. Disc levels: T12-L1:: Disc  desiccation.  Otherwise normal. L1-2: Progressive marked degenerative disc disease with increased disc space narrowing, new extensive degenerative changes of the vertebral endplates, slight retrolisthesis which is chronic, and a small broad-based disc bulge asymmetric to the left with a small protrusion into the inferior aspect of the left neural foramen. Moderate left foraminal stenosis. Slight hypertrophy of the left ligamentum flavum without significant spinal stenosis. Slight compression of the left lateral recess best seen on image 8 of series 8 which could affect the left L2 nerve. L2-3: Progressive severe degenerative disc disease with marked disc space narrowing, and slightly increased mild retrolisthesis and a broad-based endplate osteophyte symmetrically compressing the ventral aspect of the thecal sac slightly. Moderate chronic right foraminal stenosis. No spinal stenosis. L3-4: Tiny broad-based disc bulge with no neural impingement, unchanged. Chronic hypertrophy of the right facet joint and ligamentum flavum without significant spinal stenosis. L4-5: Solid interbody fusion with posterior decompression. No residual impingement. No change. L5-S1: Broad-based disc bulge most prominent centrally and to the left, slightly increased since the prior study. No discrete neural impingement. Moderate bilateral facet arthritis, slightly progressed. IMPRESSION: 1. Progressive marked degenerative disc disease at L1-2 with a slight compression of the left lateral recess which could affect the left L2 nerve. 2. Progressive severe degenerative disc disease at L2-3 with moderate chronic right foraminal stenosis. 3. Slight progression of degenerative disc disease at L5-S1 without focal neural impingement. Electronically Signed   By: Lorriane Shire M.D.   On: 03/13/2020 18:33    Procedures Procedures (including critical care time)  Medications Ordered in ED Medications - No data to display  ED Course  I have  reviewed the triage vital signs and the nursing notes.  Pertinent labs & imaging results that were available during my care of the patient were reviewed by me and considered in my medical decision making (see chart for details).  Clinical Course as of Mar 13 1854  Thu Mar 13, 2034  [id number] 74 year old male with complaint of left leg weakness after fall 1 month ago.  On exam, is able to straight leg raise the left leg and flex at the hip and knee, is able to plantar and dorsiflex foot, reflexes are symmetric.  Does have subjective diminished sensation to the lateral aspect of the left leg. MRI T-spine and L-spine shows degenerative changes in the lumbar spine with compression at the L2 nerve potentially.  Patient was evaluated by Dr. Sabra Heck, ER attending.  Plan is to ambulate patient, if he is able to ambulate safely he may be discharged home to follow-up with neurosurgery, referral given.  If patient is not able to ambulate, he may need SNF placement.   [LM]    Clinical Course User Index [LM] Roque Lias   MDM Rules/Calculators/A&P                          Final Clinical Impression(s) / ED Diagnoses Final diagnoses:  Lumbar degenerative disc disease    Rx / DC Orders ED Discharge Orders  None       Roque Lias 03/13/20 Carson Myrtle, MD 03/14/20 [phone]

## 2020-03-13 NOTE — ED Notes (Signed)
Walked with pt with walker x2 assist. Pt does well controlling self when left leg tends to go its own way. Pt states he does not have control of left leg.

## 2020-03-13 NOTE — Patient Instructions (Signed)
Patient sent to the emergency room follow-up as previously scheduled pending findings

## 2020-03-13 NOTE — ED Triage Notes (Addendum)
Fell 6 weeks ago and has had multiple fall since. C/o pain in entire left side. States he is unable to stand. Patient is hard of hearing

## 2020-03-13 NOTE — Telephone Encounter (Signed)
pt called to cancel this appt due to his left leg is weak and he has an doctor's appt to see about it

## 2020-03-13 NOTE — ED Provider Notes (Signed)
After pt was discharged after ambulating effectively,  He asked for additional pain medicine for the left hip.  He was given a lidoderm patch and prescription for the same if this is effective, he can get this filled.   Evalee [place], PA-C 03/13/20 2001    Noemi Chapel, MD 03/14/20 [phone]

## 2020-03-13 NOTE — Assessment & Plan Note (Signed)
Patient sent to the emergency room for better work-up of weakness of the left leg.  Sensation change possibly related to peripheral neuropathy however not able to rule out other changes secondary to the fact that he is unable to stand fully on his leg without giving out.  Has had multiple falls in the last several days since the first 1 2 weeks ago.  Given the numbness and heavy sensation change and ongoing falls emergency room was suggested and encouraged.  Emergency room provider was notified.  Patient's wife was able to drive him across the street.

## 2020-03-13 NOTE — ED Provider Notes (Signed)
Medical screening examination/treatment/procedure(s) were conducted as a shared visit with non-physician practitioner(s) and myself.  I personally evaluated the patient during the encounter.  Clinical Impression:   Final diagnoses:  Lumbar degenerative disc disease    This patient is a 74 year old male presenting after having a month of right leg problems which she describes as the leg not working right.  He says that he is having some occasional falls because the knee will give out, this all started after he had a fall onto his left hip approximately 1 month ago.  He denies any significant back pain at this time though he does have some discomfort in his left knee for which she wears a brace, on my exam he is able to straight leg raise on that side but slightly weaker than the right, when testing sensation at first he states that the left side does not feel the same but upon further testing with both light touch and pinprick he states that they are virtually the same.  He is able to dorsiflex and plantarflex at the ankle on the left and he is able to flex and extend at the knee the both of these motions are slightly weak compared to the right.  There is no deficits in the upper extremities, cranial nerves III through XII are normal with no facial droop, no slurred speech, he will need to ambulate in the emergency department, he will have an MRI of his thoracic and lumbar spines and I anticipate that he will be able to follow-up in the outpatient setting.   Noemi Chapel, MD 03/14/20 [phone]

## 2020-03-13 NOTE — Progress Notes (Signed)
Subjective:  Patient ID: [name], male    DOB: Dec 09, 1945  Age: 74 y.o. MRN: [id number]  CC:  Chief Complaint  Patient presents with  . Knee Problem    weak in the left knee has been going numb has been getting weaker since he was here last time 2 weeks ago he fell in the yard has fallen 4 x in the last two days       HPI  HPI   [name] is a 74 year old male patient very pleasant and jovial gentleman.  He is still rather new to me as I just established him a week ago.  He presents today after sustaining a fall 2 weeks ago in his front yard.  He reports that the soles of his diabetic shoes were coming undone one of them folded up under him and therefore he felt.  He reports that he went down forward to his knees first he believes he hit his left knee first before the right.  He ended up going flat onto his belly.  He reports he did not hit his head.  He did not have any abnormal bleeding.  After the pain subsided he was able to get himself up.  He was at home alone this was not witnessed.  Currently not able to trust his left leg when standing.  As he has fallen 4 times in the last 2 days.  He reports that the leg feels numb and heavy there is no tingling however.  He does have peripheral neuropathy.  He does report a sensation change from from the right side.  He has a brace that he has on at this time trying to help stability.  Arrives without wheelchair but is in a wheelchair right now as he just does not trust himself.  Been sleeping a lot the last few days.  Denies having any syncopal event.  Does have a history of having some lightheadedness but that has been ongoing for a while.    Is willing to go to the emergency room to be assessed and checked out.  He is anticoagulated with Plavix and aspirin.  Today patient denies signs and symptoms of COVID 19 infection including fever, chills, cough, shortness of breath, and headache. Past Medical, Surgical, Social History,  Allergies, and Medications have been Reviewed.   Past Medical History:  Diagnosis Date  . Aftercare following surgery of the circulatory system, [place] 12/04/2013  . Angina decubitus ([place]) 05/19/2016  . Arrhythmia 05/20/2016  . Arthritis   . ARTHRITIS, RIGHT FOOT 06/26/2008   Qualifier: Diagnosis of  By: Aline Brochure MD, Dorothyann Peng    . Cardiomyopathy- EF NL 01/2013, now 30-35% echo 03/14/2012  . Carotid artery occlusion    left s/p CEA  . Cellulitis of left foot 05/07/2018  . CHF (congestive heart failure) ([place])   . Critical lower limb ischemia 02/28/2014   Critical limb ischemia   . Diabetes mellitus   . Elevated troponin 04/11/2016  . GERD (gastroesophageal reflux disease)   . History of stroke June 2013 03/14/2012  . HOH (hard of hearing)   . Hypertension   . Hypothyroidism   . Nonhealing skin ulcer ([place]) 10/28/2014  . Obstructive sleep apnea   . Orthostatic hypotension   . Osteomyelitis ([place])    left great toe  . Pain in the chest 05/17/2016  . Peripheral arterial disease ([place]), s/p PTA x 2 RLE    nonhealing ulcers bilaterally on each great toe  .  Pneumonia   . PONV (postoperative nausea and vomiting)   . Pulmonary nodule 05/17/2016  . Restless leg syndrome   . Shortness of breath   . Stroke [facility]) March 08, 2012  . Toe osteomyelitis, left ([place])   . Ulcer of great toe, left, with necrosis of bone ([place])   . Wears dentures     Current Meds  Medication Sig  . aspirin EC 81 MG EC tablet Take 1 tablet (81 mg total) by mouth daily. (Patient taking differently: Take 81 mg by mouth at bedtime. )  . b complex vitamins tablet Take 1 tablet by mouth daily.  . carvedilol (COREG) 3.125 MG tablet Take 1 tablet (3.125 mg total) by mouth 2 (two) times daily with a meal.  . Cholecalciferol (VITAMIN D3) 5000 units TABS Take 5,000 Units by mouth 2 (two) times daily.   . clopidogrel (PLAVIX) 75 MG tablet Take 75 mg by mouth daily.   . Continuous Blood Gluc Receiver (FREESTYLE LIBRE 14 DAY READER) DEVI   .  Continuous Blood Gluc Sensor (FREESTYLE LIBRE 14 DAY SENSOR) MISC   . Cyanocobalamin ([employer] B-12) 1500 MCG TBDP Take 1,500 mcg by mouth daily.   . diclofenac Sodium (VOLTAREN) 1 % GEL Apply 2 g topically 4 (four) times daily as needed.  . DULoxetine (CYMBALTA) 60 MG capsule Take 60 mg by mouth daily.  . Empagliflozin-linaGLIPtin (GLYXAMBI) 10-5 MG TABS Take 1 tablet by mouth daily.  . furosemide (LASIX) 40 MG tablet TAKE ONE TABLET (40MG  TOTAL) BY MOUTH TWO TIMES DAILY. (Patient taking differently: Take 40 mg by mouth 2 (two) times daily. )  . glimepiride (AMARYL) 4 MG tablet Take 4 mg by mouth daily with breakfast.  . levothyroxine (SYNTHROID, LEVOTHROID) 75 MCG tablet Take 75 mcg by mouth daily before breakfast.   . linaclotide (LINZESS) 290 MCG CAPS capsule Take 290 mcg by mouth daily before breakfast.  . Multiple Vitamin (MULTIVITAMIN WITH MINERALS) TABS tablet Take 1 tablet by mouth daily.  . nitroGLYCERIN (NITROSTAT) 0.4 MG SL tablet Place 1 tablet (0.4 mg total) under the tongue every 5 (five) minutes as needed for chest pain.  . pantoprazole (PROTONIX) 40 MG tablet Take 40 mg by mouth daily.  . potassium chloride SA (K-DUR,KLOR-CON) 20 MEQ tablet Take 1 tablet (20 mEq total) by mouth daily. Please schedule appointment for refills (Patient taking differently: Take 20 mEq by mouth every other day. Please schedule appointment for refills)  . pramipexole (MIRAPEX) 0.25 MG tablet Take 2 tablets (0.5 mg total) by mouth 2 (two) times daily. Take 0.5mg  by mouth at 7 PM and 0.5mg  at 9 PM daily.  . rosuvastatin (CRESTOR) 20 MG tablet Take 20 mg by mouth daily.    ROS:  Review of Systems  HENT: Negative.   Eyes: Negative.   Respiratory: Negative.   Cardiovascular: Negative.   Gastrointestinal: Negative.   Genitourinary: Negative.   Musculoskeletal: Positive for falls and joint pain.  Skin: Negative.   Neurological: Positive for sensory change.  Endo/Heme/Allergies: Negative.     Psychiatric/Behavioral: Negative.   All other systems reviewed and are negative.    Objective:   Today's Vitals: BP 115/72 (BP Location: Left Arm, Patient Position: Sitting, Cuff Size: Normal)   Pulse 73   Temp (!) 97.2 F (36.2 C) (Temporal)   Ht 6' (1.829 m)   Wt 218 lb (98.9 kg)   SpO2 97%   BMI 29.57 kg/m  Vitals with BMI 03/13/2020 02/28/2020 02/25/2020  Height 6\' 0"  6\' 0"  -  Weight 218 lbs 218 lbs 13 oz -  BMI 59.16 38.46 -  Systolic 659 935 701  Diastolic 72 68 75  Pulse 73 79 71     Physical Exam Vitals and nursing note reviewed.  Constitutional:      Appearance: Normal appearance. He is well-developed, well-groomed and overweight.  HENT:     Head: Normocephalic and atraumatic.     Right Ear: External ear normal.     Left Ear: External ear normal.     Mouth/Throat:     Comments: Mask in place Eyes:     General:        Right eye: No discharge.        Left eye: No discharge.     Conjunctiva/sclera: Conjunctivae normal.  Cardiovascular:     Rate and Rhythm: Normal rate and regular rhythm.     Pulses: Normal pulses.     Heart sounds: Normal heart sounds.  Pulmonary:     Effort: Pulmonary effort is normal.     Breath sounds: Normal breath sounds.  Musculoskeletal:     Cervical back: Normal range of motion and neck supple.     Comments: Noted brace on left leg.  Sensory changes at lower left leg.  Left side is diminished compared to the right.  Is in wheelchair at this time.  Skin:    General: Skin is warm.  Neurological:     General: No focal deficit present.     Mental Status: He is alert and oriented to person, place, and time.  Psychiatric:        Mood and Affect: Mood normal.        Behavior: Behavior normal.        Thought Content: Thought content normal.        Judgment: Judgment normal.      Assessment   1. Fall at home, initial encounter     Tests ordered No orders of the defined types were placed in this encounter.    Plan: Please  see assessment and plan per problem list above.   No orders of the defined types were placed in this encounter.   Patient to follow-up in 05/30/2020   Perlie Mayo, NP

## 2020-03-13 NOTE — Discharge Instructions (Addendum)
Follow up with neurosurgery, referral given. Use a walker to ambulate safely.

## 2020-03-18 ENCOUNTER — Telehealth (HOSPITAL_COMMUNITY): Payer: Self-pay | Admitting: Physical Therapy

## 2020-03-18 ENCOUNTER — Ambulatory Visit (HOSPITAL_COMMUNITY): Payer: PPO | Admitting: Physical Therapy

## 2020-03-18 NOTE — Telephone Encounter (Signed)
pt called to cancel these appts due to he has fallen

## 2020-03-19 ENCOUNTER — Telehealth: Payer: Self-pay

## 2020-03-19 NOTE — Telephone Encounter (Signed)
Called pt to schedule TCS w/Prop w/RMR. Pt is unable to have TCS at this time. States "I'm laid up right now". He fell and hurt back and leg. He will notify our office when he's able to proceed with TCS.

## 2020-03-20 ENCOUNTER — Ambulatory Visit (HOSPITAL_COMMUNITY): Payer: PPO | Admitting: Physical Therapy

## 2020-03-25 ENCOUNTER — Telehealth: Payer: Self-pay

## 2020-03-25 ENCOUNTER — Telehealth (HOSPITAL_COMMUNITY): Payer: Self-pay

## 2020-03-25 ENCOUNTER — Ambulatory Visit (HOSPITAL_COMMUNITY): Payer: PPO

## 2020-03-25 NOTE — Telephone Encounter (Signed)
Left Leg, is still not working , has been in the bed for the last 2 weeks, he needs to speak with Jarrett Soho,   Pain in left hip also off and on for the last 2 weeks.

## 2020-03-25 NOTE — Telephone Encounter (Signed)
noted 

## 2020-03-25 NOTE — Telephone Encounter (Signed)
Completed  sch 7-7 @ 4:20

## 2020-03-25 NOTE — Telephone Encounter (Signed)
Please put pt in for phone visit

## 2020-03-25 NOTE — Telephone Encounter (Signed)
pt cancelled appt for 7/8 because he is having trouble walking

## 2020-03-25 NOTE — Telephone Encounter (Signed)
Paitent l/m to cx they can not be here today- too weak to get here

## 2020-03-26 ENCOUNTER — Other Ambulatory Visit: Payer: Self-pay

## 2020-03-26 ENCOUNTER — Telehealth (INDEPENDENT_AMBULATORY_CARE_PROVIDER_SITE_OTHER): Payer: PPO | Admitting: Family Medicine

## 2020-03-26 ENCOUNTER — Encounter: Payer: Self-pay | Admitting: Family Medicine

## 2020-03-26 VITALS — BP 151/74 | Ht 72.0 in | Wt 217.0 lb

## 2020-03-26 DIAGNOSIS — R9389 Abnormal findings on diagnostic imaging of other specified body structures: Secondary | ICD-10-CM | POA: Insufficient documentation

## 2020-03-26 DIAGNOSIS — M259 Joint disorder, unspecified: Secondary | ICD-10-CM | POA: Diagnosis not present

## 2020-03-26 NOTE — Assessment & Plan Note (Signed)
Unsure of knee issue, phone visit limited in PE. Ortho referral placed.

## 2020-03-26 NOTE — Progress Notes (Signed)
Virtual Visit via Telephone Note   This visit type was conducted due to national recommendations for restrictions regarding the COVID-19 Pandemic (e.g. social distancing) in an effort to limit this patient's exposure and mitigate transmission in our community.  Due to his co-morbid illnesses, this patient is at least at moderate risk for complications without adequate follow up.  This format is felt to be most appropriate for this patient at this time.  The patient did not have access to video technology/had technical difficulties with video requiring transitioning to audio format only (telephone).  All issues noted in this document were discussed and addressed.  No physical exam could be performed with this format.    Evaluation Performed:  Follow-up visit  Date:  03/26/2020   ID:  Christopher, [name] 1946/04/22, MRN [id number]  Patient Location: Home Provider Location: Office  Location of Patient: Home Location of Provider: Telehealth Consent was obtain for visit to be over via telehealth. I verified that I am speaking with the correct person using two identifiers.  PCP:  Christopher Mayo, NP   Chief Complaint:  Leg and back pain History of Present Illness:    [name] is a 74 y.o. male with   Seen on 03/13/2020 in office after Falls. Sent to ED for work up.  ED note HPI: This patient is a 74 year old male presenting after having a month of right leg problems which she describes as the leg not working right.  He says that he is having some occasional falls because the knee will give out, this all started after he had a fall onto his left hip approximately 1 month ago.  He denies any significant back pain at this time though he does have some discomfort in his left knee for which she wears a brace, on my exam he is able to straight leg raise on that side but slightly weaker than the right, when testing sensation at first he states that the left side does not feel the same but  upon further testing with both light touch and pinprick he states that they are virtually the same.  He is able to dorsiflex and plantarflex at the ankle on the left and he is able to flex and extend at the knee the both of these motions are slightly weak compared to the right.  There is no deficits in the upper extremities, cranial nerves III through XII are normal with no facial droop, no slurred speech, he will need to ambulate in the emergency department, he will have an MRI of his thoracic and lumbar spines and I anticipate that he will be able to follow-up in the outpatient setting.  Today reports left leg will not hold him. He can not straighten it out and if he uses it it throbs in pain. He was wondering if tissue that was supposed to be between joints is gone and its been popping. This has been going on since he fell but its getting worse instead of better. Using lidocaine patches and heating pad.  Did not realize he was suppose to follow up with neurosurgery.   The patient does not have symptoms concerning for COVID-19 infection (fever, chills, cough, or new shortness of breath).   Past Medical, Surgical, Social History, Allergies, and Medications have been Reviewed.  Past Medical History:  Diagnosis Date  . Aftercare following surgery of the circulatory system, [place] 12/04/2013  . Angina decubitus ([place]) 05/19/2016  . Arrhythmia 05/20/2016  .  Arthritis   . ARTHRITIS, RIGHT FOOT 06/26/2008   Qualifier: Diagnosis of  By: Aline Brochure MD, Dorothyann Peng    . Cardiomyopathy- EF NL 01/2013, now 30-35% echo 03/14/2012  . Carotid artery occlusion    left s/p CEA  . Cellulitis of left foot 05/07/2018  . CHF (congestive heart failure) ([place])   . Critical lower limb ischemia 02/28/2014   Critical limb ischemia   . Diabetes mellitus   . Elevated troponin 04/11/2016  . GERD (gastroesophageal reflux disease)   . History of stroke June 2013 03/14/2012  . HOH (hard of hearing)   . Hypertension   . Hypothyroidism     . Nonhealing skin ulcer ([place]) 10/28/2014  . Obstructive sleep apnea   . Orthostatic hypotension   . Osteomyelitis ([place])    left great toe  . Pain in the chest 05/17/2016  . Peripheral arterial disease ([place]), s/p PTA x 2 RLE    nonhealing ulcers bilaterally on each great toe  . Pneumonia   . PONV (postoperative nausea and vomiting)   . Pulmonary nodule 05/17/2016  . Restless leg syndrome   . Shortness of breath   . Stroke [facility]) March 08, 2012  . Toe osteomyelitis, left ([place])   . Ulcer of great toe, left, with necrosis of bone ([place])   . Wears dentures    Past Surgical History:  Procedure Laterality Date  . AMPUTATION TOE Left 07/25/2018   Procedure: AMPUTATION TOE INTERPHALANGEAL HALLUX LEFT;  Surgeon: Evelina Bucy, DPM;  Location: [place];  Service: Podiatry;  Laterality: Left;  . ANGIOPLASTY  02/28/14   diamond back orbital rotational atherectomy of Rt. tibial  . BACK SURGERY    . BONE BIOPSY Left 07/25/2018   Procedure: SUPERFICIAL BONE BIOPSY;  Surgeon: Evelina Bucy, DPM;  Location: [place];  Service: Podiatry;  Laterality: Left;  . CARDIAC CATHETERIZATION N/A 05/19/2016   Procedure: Right/Left Heart Cath and Coronary Angiography;  Surgeon: Belva Crome, MD;  Location: [place] CV LAB;  Service: Cardiovascular;  Laterality: N/A;  . CERVICAL FUSION    . ENDARTERECTOMY Left 11/29/2013   Procedure: ENDARTERECTOMY CAROTID;  Surgeon: Serafina Mitchell, MD;  Location[facility] OR;  Service: Vascular;  Laterality: Left;  . ESOPHAGOGASTRODUODENOSCOPY  02/2010   Dr. Gala Romney: patient presented with food impaction, schatzki ring with superimposed component of stricture with erosive reflux esophagitis, s/p disimpaction but dilation planned at later date   . ESOPHAGOGASTRODUODENOSCOPY (EGD) WITH PROPOFOL N/A 08/13/2019   Dr. Gala Romney: Erosive reflux esophagitis with mild stricture and incidental Mallory-Weiss tear which precluded esophageal dilation.  Medium sized hiatal hernia.  [name]  ESOPHAGOGASTRODUODENOSCOPY (EGD) WITH PROPOFOL N/A 10/11/2019   Dr. Gala Romney: Esophageal stenosis status post dilation, moderate hiatal hernia  . EYE SURGERY    . FLEXIBLE SIGMOIDOSCOPY N/A 08/13/2019   Procedure: FLEXIBLE SIGMOIDOSCOPY;  Surgeon: [name], MD;  [facility];  Service: Endoscopy;  Laterality: N/A;  colonoscopy aboerted due to formed stool and poor prep  . FOOT SURGERY    . Lower ext duplex doppler  03/14/14   Rt ABI 1.2  . LOWER EXTREMITY ANGIOGRAM Bilateral 02/18/2014   Procedure: LOWER EXTREMITY ANGIOGRAM;  Surgeon: [name], MD;  Location: [facility] CATH LAB;  Service: Cardiovascular;  Laterality: Bilateral;  . LOWER EXTREMITY ANGIOGRAM N/A 10/31/2014   Procedure: LOWER EXTREMITY ANGIOGRAM;  Surgeon: [name], MD;  Location: [facility] CATH LAB;  Service: Cardiovascular;  Laterality: N/A;  . MALONEY DILATION N/A 10/11/2019   Procedure: Venia Minks  DILATION;  Surgeon: [name], MD;  [facility];  Service: Endoscopy;  Laterality: N/A;  . MULTIPLE TOOTH EXTRACTIONS    . PV angiogram  02/18/2014   tibial vessel diseas bil.  [name] SPINE SURGERY    . tendon achillies lengthing and sesamoid       Current Meds  Medication Sig  . aspirin EC 81 MG EC tablet Take 1 tablet (81 mg total) by mouth daily. (Patient taking differently: Take 81 mg by mouth at bedtime. )  . b complex vitamins tablet Take 1 tablet by mouth daily.  . carvedilol (COREG) 3.125 MG tablet Take 1 tablet (3.125 mg total) by mouth 2 (two) times daily with a meal.  . Cholecalciferol (VITAMIN D3) 5000 units TABS Take 5,000 Units by mouth 2 (two) times daily.   . clopidogrel (PLAVIX) 75 MG tablet Take 75 mg by mouth daily.   . Continuous Blood Gluc Receiver (FREESTYLE LIBRE 14 DAY READER) DEVI   . Continuous Blood Gluc Sensor (FREESTYLE LIBRE 14 DAY SENSOR) MISC   . Cyanocobalamin (CVS B-12) 1500 MCG TBDP Take 1,500 mcg by mouth daily.   . diclofenac Sodium (VOLTAREN) 1 % GEL Apply 2 g  topically 4 (four) times daily as needed.  . DULoxetine (CYMBALTA) 60 MG capsule Take 60 mg by mouth daily.  . empagliflozin (JARDIANCE) 25 MG TABS tablet Take 1 tablet (25 mg total) by mouth daily before breakfast.  . Empagliflozin-linaGLIPtin (GLYXAMBI) 10-5 MG TABS Take 1 tablet by mouth daily.  . furosemide (LASIX) 40 MG tablet TAKE ONE TABLET (40MG  TOTAL) BY MOUTH TWO TIMES DAILY. (Patient taking differently: Take 40 mg by mouth 2 (two) times daily. )  . glimepiride (AMARYL) 4 MG tablet Take 4 mg by mouth daily with breakfast.  . levothyroxine (SYNTHROID, LEVOTHROID) 75 MCG tablet Take 75 mcg by mouth daily before breakfast.   . lidocaine (LIDODERM) 5 % Place 1 patch onto the skin daily. Remove & Discard patch within 12 hours or as directed by MD  . linaclotide (LINZESS) 290 MCG CAPS capsule Take 290 mcg by mouth daily before breakfast.  . Multiple Vitamin (MULTIVITAMIN WITH MINERALS) TABS tablet Take 1 tablet by mouth daily.  . nitroGLYCERIN (NITROSTAT) 0.4 MG SL tablet Place 1 tablet (0.4 mg total) under the tongue every 5 (five) minutes as needed for chest pain.  . pantoprazole (PROTONIX) 40 MG tablet Take 40 mg by mouth daily.  . potassium chloride SA (K-DUR,KLOR-CON) 20 MEQ tablet Take 1 tablet (20 mEq total) by mouth daily. Please schedule appointment for refills (Patient taking differently: Take 20 mEq by mouth every other day. Please schedule appointment for refills)  . pramipexole (MIRAPEX) 0.25 MG tablet Take 2 tablets (0.5 mg total) by mouth 2 (two) times daily. Take 0.5mg  by mouth at 7 PM and 0.5mg  at 9 PM daily.  . rosuvastatin (CRESTOR) 20 MG tablet Take 20 mg by mouth daily.     Allergies:   Codeine and Tramadol   ROS:   Please see the history of present illness.    All other systems reviewed and are negative.   Labs/Other Tests and Data Reviewed:    Recent Labs: 10/09/2019: BUN 15; Creatinine, Ser 0.89; Hemoglobin 11.5; Platelets 216; Potassium 3.4; Sodium 132    Recent Lipid Panel Lab Results  Component Value Date/Time   CHOL 162 11/26/2014 09:30 AM   TRIG 127 11/26/2014 09:30 AM   HDL 42 11/26/2014 09:30 AM   CHOLHDL 3.9 11/26/2014 09:30 AM  [place] 95 11/26/2014 09:30 AM    Wt Readings from Last 3 Encounters:  03/26/20 217 lb (98.4 kg)  03/13/20 217 lb (98.4 kg)  03/13/20 218 lb (98.9 kg)     Objective:    Vital Signs:  BP (!) 151/74   Ht 6' (1.829 m)   Wt 217 lb (98.4 kg)   BMI 29.43 kg/m    VITAL SIGNS:  reviewed GEN:  alert and oriented  RESPIRATORY:  no shortness of breath in conversation  PSYCH:  normal affect and mood   ASSESSMENT & PLAN:    1. Abnormal MRI  - Ambulatory referral to Neurosurgery  2. Joint disorder of knee  - Ambulatory referral to Orthopedic Surgery   Time:   Today, I have spent 10 minutes with the patient with telehealth technology discussing the above problems.     Medication Adjustments/Labs and Tests Ordered: Current medicines are reviewed at length with the patient today.  Concerns regarding medicines are outlined above.   Tests Ordered: No orders of the defined types were placed in this encounter.   Medication Changes: No orders of the defined types were placed in this encounter.   Disposition:  Follow up 6- 8weeks Signed, Christopher Mayo, NP  03/26/2020 1:39 PM     [facility]

## 2020-03-26 NOTE — Assessment & Plan Note (Signed)
Several changes on MRI, didn't realize he was to follow up with neurosx. Order placed for urgent today. Advised of when to go to the ED should symptoms get worse. Patient acknowledged agreement and understanding of the plan.

## 2020-03-26 NOTE — Patient Instructions (Signed)
I appreciate the opportunity to provide you with care for your health and wellness. Today we discussed: on going back and knee trouble  Follow up: 05/30/2020 as scheduled  No labs   Referrals today: Ortho for Knee Joint; Neurosurgery for back changes  Please continue to practice social distancing to keep you, your family, and our community safe.  If you must go out, please wear a mask and practice good handwashing.  It was a pleasure to see you and I look forward to continuing to work together on your health and well-being. Please do not hesitate to call the office if you need care or have questions about your care.  Have a wonderful day and week. With Gratitude, Cherly Beach, DNP, AGNP-BC

## 2020-03-27 ENCOUNTER — Telehealth: Payer: Self-pay | Admitting: Family Medicine

## 2020-03-27 ENCOUNTER — Ambulatory Visit (HOSPITAL_COMMUNITY): Payer: PPO

## 2020-03-27 NOTE — Telephone Encounter (Signed)
Pt calling about results

## 2020-03-28 NOTE — Telephone Encounter (Signed)
LVM for pt to call the office wondering what results pt is wanting

## 2020-03-30 NOTE — Progress Notes (Signed)
  Subjective:  Patient ID: [name], male    DOB: 21-Aug-1946,  MRN: [id number]  Chief Complaint  Patient presents with  . Wound Check    Right plantar midfoot wound check. Pt states no new concerns. Denies fever/chills/nausea/vomiting.    74 y.o. male presents for wound care. Hx confirmed with patient.  Has been applying iodosorb to the wound. Objective:  Physical Exam: Wound Location: Right submet 1 Wound Measurement: 2x2 Wound Base: Granular/Healthy Peri-wound: Calloused Exudate: Scant/small amount Serosanguinous exudate wound without warmth, erythema, signs of acute infection   Assessment:   1. Diabetic ulcer of right midfoot associated with diabetes mellitus due to underlying condition, with muscle involvement without evidence of necrosis [facility])     Plan:  Patient was evaluated and treated and all questions answered.  Ulcer Right 1st MPJ -Dressing applied consisting of medihoney and Mepilex border dressing -Wound cleansed and debrided -Consider referral to wound care center  Procedure: Selective Debridement of Wound Rationale: Removal of devitalized tissue from the wound to promote healing.  Pre-Debridement Wound Measurements: 2x2x0.2 Post-Debridement Wound Measurements: same as pre-debridement. Type of Debridement: sharp selective Tissue Removed: Devitalized soft-tissue Dressing: Dry, sterile, compression dressing. Disposition: Patient tolerated procedure well. Patient to return in 1 week for follow-up.      No follow-ups on file.

## 2020-04-01 ENCOUNTER — Other Ambulatory Visit: Payer: Self-pay

## 2020-04-01 ENCOUNTER — Ambulatory Visit (HOSPITAL_COMMUNITY): Payer: PPO

## 2020-04-01 ENCOUNTER — Encounter: Payer: Self-pay | Admitting: Orthopaedic Surgery

## 2020-04-01 ENCOUNTER — Ambulatory Visit (INDEPENDENT_AMBULATORY_CARE_PROVIDER_SITE_OTHER): Payer: PPO | Admitting: Orthopaedic Surgery

## 2020-04-01 ENCOUNTER — Ambulatory Visit: Payer: PPO

## 2020-04-01 ENCOUNTER — Telehealth (HOSPITAL_COMMUNITY): Payer: Self-pay

## 2020-04-01 VITALS — BP 153/80 | HR 81 | Ht 72.0 in | Wt 212.0 lb

## 2020-04-01 DIAGNOSIS — G8929 Other chronic pain: Secondary | ICD-10-CM | POA: Diagnosis not present

## 2020-04-01 DIAGNOSIS — M25562 Pain in left knee: Secondary | ICD-10-CM | POA: Diagnosis not present

## 2020-04-01 NOTE — Telephone Encounter (Signed)
He can not come in today he has hurt his hip and will see Dr. Luna Glasgow this afternoon

## 2020-04-01 NOTE — Progress Notes (Signed)
Subjective:    Patient ID: [name], male    DOB: 11-19-1945, 74 y.o.   MRN: [id number]  HPI He has had increasing pain in the left knee over the last month.  He has giving way of the knee and now some locking and also inability to fully extend the knee.  He uses a rolling walker now to ambulate and he has pain.  He has no trauma, no redness.  He was seen at [facility] on 03-13-2020 and I have reviewed the notes.  I have reviewed the MRI and x-rays of the lumbar spine and back.  His main complaint is the left knee and pain and giving way and instability.  Nothing seems to help it.   Review of Systems  Constitutional: Positive for activity change.  Respiratory: Positive for shortness of breath.   Cardiovascular: Positive for chest pain and palpitations.  Musculoskeletal: Positive for arthralgias, back pain, gait problem and joint swelling.  All other systems reviewed and are negative.  For Review of Systems, all other systems reviewed and are negative.  The following is a summary of the past history medically, past history surgically, known current medicines, social history and family history.  This information is gathered electronically by the computer from prior information and documentation.  I review this each visit and have found including this information at this point in the chart is beneficial and informative.   Past Medical History:  Diagnosis Date  . Aftercare following surgery of the circulatory system, [place] 12/04/2013  . Angina decubitus ([place]) 05/19/2016  . Arrhythmia 05/20/2016  . Arthritis   . ARTHRITIS, RIGHT FOOT 06/26/2008   Qualifier: Diagnosis of  By: Aline Brochure MD, Dorothyann Peng    . Cardiomyopathy- EF NL 01/2013, now 30-35% echo 03/14/2012  . Carotid artery occlusion    left s/p CEA  . Cellulitis of left foot 05/07/2018  . CHF (congestive heart failure) ([place])   . Critical lower limb ischemia 02/28/2014   Critical limb ischemia   . Diabetes mellitus   .  Elevated troponin 04/11/2016  . GERD (gastroesophageal reflux disease)   . History of stroke June 2013 03/14/2012  . HOH (hard of hearing)   . Hypertension   . Hypothyroidism   . Nonhealing skin ulcer ([place]) 10/28/2014  . Obstructive sleep apnea   . Orthostatic hypotension   . Osteomyelitis ([place])    left great toe  . Pain in the chest 05/17/2016  . Peripheral arterial disease ([place]), s/p PTA x 2 RLE    nonhealing ulcers bilaterally on each great toe  . Pneumonia   . PONV (postoperative nausea and vomiting)   . Pulmonary nodule 05/17/2016  . Restless leg syndrome   . Shortness of breath   . Stroke [facility]) March 08, 2012  . Toe osteomyelitis, left ([place])   . Ulcer of great toe, left, with necrosis of bone ([place])   . Wears dentures     Past Surgical History:  Procedure Laterality Date  . AMPUTATION TOE Left 07/25/2018   Procedure: AMPUTATION TOE INTERPHALANGEAL HALLUX LEFT;  Surgeon: Evelina Bucy, DPM;  Location: [place];  Service: Podiatry;  Laterality: Left;  . ANGIOPLASTY  02/28/14   diamond back orbital rotational atherectomy of Rt. tibial  . BACK SURGERY    . BONE BIOPSY Left 07/25/2018   Procedure: SUPERFICIAL BONE BIOPSY;  Surgeon: Evelina Bucy, DPM;  Location: [place];  Service: Podiatry;  Laterality: Left;  . CARDIAC CATHETERIZATION N/A 05/19/2016  Procedure: Right/Left Heart Cath and Coronary Angiography;  Surgeon: Belva Crome, MD;  Location: [place] CV LAB;  Service: Cardiovascular;  Laterality: N/A;  . CERVICAL FUSION    . ENDARTERECTOMY Left 11/29/2013   Procedure: ENDARTERECTOMY CAROTID;  Surgeon: Serafina Mitchell, MD;  Location: [facility] OR;  Service: Vascular;  Laterality: Left;  . ESOPHAGOGASTRODUODENOSCOPY  02/2010   Dr. Gala Romney: patient presented with food impaction, schatzki ring with superimposed component of stricture with erosive reflux esophagitis, s/p disimpaction but dilation planned at later date   . ESOPHAGOGASTRODUODENOSCOPY (EGD) WITH PROPOFOL N/A 08/13/2019   Dr.  Gala Romney: Erosive reflux esophagitis with mild stricture and incidental Mallory-Weiss tear which precluded esophageal dilation.  Medium sized hiatal hernia.  [name] ESOPHAGOGASTRODUODENOSCOPY (EGD) WITH PROPOFOL N/A 10/11/2019   Dr. Gala Romney: Esophageal stenosis status post dilation, moderate hiatal hernia  . EYE SURGERY    . FLEXIBLE SIGMOIDOSCOPY N/A 08/13/2019   Procedure: FLEXIBLE SIGMOIDOSCOPY;  Surgeon: [name], MD;  [facility];  Service: Endoscopy;  Laterality: N/A;  colonoscopy aboerted due to formed stool and poor prep  . FOOT SURGERY    . Lower ext duplex doppler  03/14/14   Rt ABI 1.2  . LOWER EXTREMITY ANGIOGRAM Bilateral 02/18/2014   Procedure: LOWER EXTREMITY ANGIOGRAM;  Surgeon: [name], MD;  Location: [facility] CATH LAB;  Service: Cardiovascular;  Laterality: Bilateral;  . LOWER EXTREMITY ANGIOGRAM N/A 10/31/2014   Procedure: LOWER EXTREMITY ANGIOGRAM;  Surgeon: [name], MD;  Location: [facility] CATH LAB;  Service: Cardiovascular;  Laterality: N/A;  . Venia Minks DILATION N/A 10/11/2019   Procedure: Venia Minks DILATION;  Surgeon: [name], MD;  [facility];  Service: Endoscopy;  Laterality: N/A;  . MULTIPLE TOOTH EXTRACTIONS    . PV angiogram  02/18/2014   tibial vessel diseas bil.  [name] SPINE SURGERY    . tendon achillies lengthing and sesamoid      Current Outpatient Medications on File Prior to Visit  Medication Sig Dispense Refill  . aspirin EC 81 MG EC tablet Take 1 tablet (81 mg total) by mouth daily. (Patient taking differently: Take 81 mg by mouth at bedtime. )    . b complex vitamins tablet Take 1 tablet by mouth daily.    . carvedilol (COREG) 3.125 MG tablet Take 1 tablet (3.125 mg total) by mouth 2 (two) times daily with a meal. 180 tablet 3  . clopidogrel (PLAVIX) 75 MG tablet Take 75 mg by mouth daily.     . Continuous Blood Gluc Receiver (FREESTYLE LIBRE 14 DAY READER) DEVI     . Continuous Blood Gluc Sensor (FREESTYLE LIBRE 14 DAY SENSOR)  MISC     . Cyanocobalamin ([employer] B-12) 1500 MCG TBDP Take 1,500 mcg by mouth daily.     . diclofenac Sodium (VOLTAREN) 1 % GEL Apply 2 g topically 4 (four) times daily as needed. 50 g 0  . DULoxetine (CYMBALTA) 60 MG capsule Take 60 mg by mouth daily.    . empagliflozin (JARDIANCE) 25 MG TABS tablet Take 1 tablet (25 mg total) by mouth daily before breakfast. 30 tablet 0  . Empagliflozin-linaGLIPtin (GLYXAMBI) 10-5 MG TABS Take 1 tablet by mouth daily. 90 tablet 1  . furosemide (LASIX) 40 MG tablet TAKE ONE TABLET (40MG  TOTAL) BY MOUTH TWO TIMES DAILY. (Patient taking differently: Take 40 mg by mouth 2 (two) times daily. ) 180 tablet 3  . glimepiride (AMARYL) 4 MG tablet Take 4 mg by mouth daily with breakfast.    .  levothyroxine (SYNTHROID, LEVOTHROID) 75 MCG tablet Take 75 mcg by mouth daily before breakfast.     . lidocaine (LIDODERM) 5 % Place 1 patch onto the skin daily. Remove & Discard patch within 12 hours or as directed by MD 30 patch 0  . linaclotide (LINZESS) 290 MCG CAPS capsule Take 290 mcg by mouth daily before breakfast.    . Multiple Vitamin (MULTIVITAMIN WITH MINERALS) TABS tablet Take 1 tablet by mouth daily.    . nitroGLYCERIN (NITROSTAT) 0.4 MG SL tablet Place 1 tablet (0.4 mg total) under the tongue every 5 (five) minutes as needed for chest pain. 25 tablet 3  . pantoprazole (PROTONIX) 40 MG tablet Take 40 mg by mouth daily.    . potassium chloride SA (K-DUR,KLOR-CON) 20 MEQ tablet Take 1 tablet (20 mEq total) by mouth daily. Please schedule appointment for refills (Patient taking differently: Take 20 mEq by mouth every other day. Please schedule appointment for refills) 90 tablet 3  . pramipexole (MIRAPEX) 0.25 MG tablet Take 2 tablets (0.5 mg total) by mouth 2 (two) times daily. Take 0.5mg  by mouth at 7 PM and 0.5mg  at 9 PM daily. 360 tablet 3  . rosuvastatin (CRESTOR) 20 MG tablet Take 20 mg by mouth daily.    . Cholecalciferol (VITAMIN D3) 5000 units TABS Take 5,000 Units by  mouth 2 (two) times daily.  (Patient not taking: Reported on 04/01/2020)     No current facility-administered medications on file prior to visit.    Social History   Socioeconomic History  . Marital status: Married    Spouse name: [name]   . Number of children: 4  . Years of education: college  . Highest education level: Not on file  Occupational History  . Occupation: Firefighter   Tobacco Use  . Smoking status: Former Smoker    Years: 1.00    Types: Pipe    Quit date: 07/08/1977    Years since quitting: 42.7  . Smokeless tobacco: Never Used  Vaping Use  . Vaping Use: Never used  Substance and Sexual Activity  . Alcohol use: No    Alcohol/week: 0.0 standard drinks  . Drug use: No  . Sexual activity: Not on file  Other Topics Concern  . Not on file  Social History Narrative   Retired from medical -[employer]      Lives with [name]    Cat: Jazz      Enjoy: sleeping a lot       Diet: eats all food groups -chicken, steak-chopped, mostly veggie   Caffeine: coffee and soda "a lot"   Water: 3-4 cups daily      Wears seat belt   Does not use phone while driving    Oceanographer at home    weapons at home        Social Determinants of Health   Financial Resource Strain: [place]   . Difficulty of Paying Living Expenses: Not hard at all  Food Insecurity: No Food Insecurity  . Worried About Charity fundraiser in the Last Year: Never true  . Ran Out of Food in the Last Year: Never true  Transportation Needs: No Transportation Needs  . Lack of Transportation (Medical): No  . Lack of Transportation (Non-Medical): No  Physical Activity: Inactive  . Days of Exercise per Week: 0 days  . Minutes of Exercise per Session: 0 min  Stress: No Stress Concern Present  . Feeling of Stress : Only a little  Social Connections: Moderately Isolated  . Frequency of Communication with Friends and Family: Once a week  . Frequency of Social Gatherings with Friends and  Family: Once a week  . Attends Religious Services: More than 4 times per year  . Active Member of Clubs or Organizations: No  . Attends Archivist Meetings: Never  . Marital Status: Married  Human resources officer Violence: Not At Risk  . Fear of Current or Ex-Partner: No  . Emotionally Abused: No  . Physically Abused: No  . Sexually Abused: No    Family History  Problem Relation Age of Onset  . Heart disease Mother   . Hypertension Mother   . Heart attack Mother   . Hypertension Father   . Diabetes Father   . Diabetes Son   . Heart disease Son   . Hypertension Son   . Colon cancer Neg Hx     BP (!) 153/80   Pulse 81   Ht 6' (1.829 m)   Wt 212 lb (96.2 kg)   BMI 28.75 kg/m   Body mass index is 28.75 kg/m.     Objective:   Physical Exam Vitals and nursing note reviewed.  Constitutional:      Appearance: He is well-developed.  HENT:     Head: Normocephalic and atraumatic.  Eyes:     Conjunctiva/sclera: Conjunctivae normal.     Pupils: Pupils are equal, round, and reactive to light.  Cardiovascular:     Rate and Rhythm: Normal rate and regular rhythm.  Pulmonary:     Effort: Pulmonary effort is normal.  Abdominal:     Palpations: Abdomen is soft.  Musculoskeletal:     Cervical back: Normal range of motion and neck supple.       Legs:  Skin:    General: Skin is warm and dry.  Neurological:     Mental Status: He is alert and oriented to person, place, and time.     Cranial Nerves: No cranial nerve deficit.     Motor: No abnormal muscle tone.     Coordination: Coordination normal.     Deep Tendon Reflexes: Reflexes are normal and symmetric. Reflexes normal.  Psychiatric:        Behavior: Behavior normal.        Thought Content: Thought content normal.        Judgment: Judgment normal.    X-rays were done of the left knee, reported separately.       Assessment & Plan:   Encounter Diagnosis  Name Primary?  . Chronic pain of left knee Yes    I am concerned about meniscus tear.  He has locking knee, giving way, unable to fully extend and pain.  This has been present over 3 weeks, getting worse.  I would like to get MRI.  He may need arthroscopy.  PROCEDURE NOTE:  The patient requests injections of the left knee , verbal consent was obtained.  The left knee was prepped appropriately after time out was performed.   Sterile technique was observed and injection of 1 cc of Depo-Medrol 40 mg with several cc's of plain xylocaine. Anesthesia was provided by ethyl chloride and a 20-gauge needle was used to inject the knee area. The injection was tolerated well.  A band aid dressing was applied.  The patient was advised to apply ice later today and tomorrow to the injection sight as needed.  Get the MRI.  Return in one week.  Call if any problem.  Precautions  discussed.   Electronically Signed Sanjuana Kava, MD 7/13/20213:16 PM

## 2020-04-02 ENCOUNTER — Other Ambulatory Visit: Payer: Self-pay | Admitting: *Deleted

## 2020-04-02 ENCOUNTER — Telehealth: Payer: Self-pay | Admitting: Orthopaedic Surgery

## 2020-04-02 MED ORDER — LEVOTHYROXINE SODIUM 75 MCG PO TABS: 75.0000 ug | ORAL_TABLET | Freq: Every day | ORAL | 0 refills | Status: DC

## 2020-04-02 MED ORDER — TRAMADOL HCL 50 MG PO TABS: 50.0000 mg | ORAL_TABLET | Freq: Four times a day (QID) | ORAL | 0 refills | Status: AC | PRN

## 2020-04-02 NOTE — Telephone Encounter (Signed)
Relayed to patient; understands. 

## 2020-04-02 NOTE — Telephone Encounter (Signed)
Patient called to relay that he understood that a "generic medication for pain" was to be sent to [facility]; states pharmacy does not yet have the prescription. Please advise.

## 2020-04-03 ENCOUNTER — Ambulatory Visit (HOSPITAL_COMMUNITY): Payer: PPO | Admitting: Physical Therapy

## 2020-04-03 ENCOUNTER — Telehealth (HOSPITAL_COMMUNITY): Payer: Self-pay | Admitting: Physical Therapy

## 2020-04-03 NOTE — Telephone Encounter (Signed)
pt has cancelled appts for 7/15, 7/20, and 7/22 because he is having trouble walking today and next week he finds out if he is to have surgery

## 2020-04-04 ENCOUNTER — Other Ambulatory Visit: Payer: Self-pay

## 2020-04-04 ENCOUNTER — Ambulatory Visit (HOSPITAL_COMMUNITY)
Admission: RE | Admit: 2020-04-04 | Discharge: 2020-04-04 | Disposition: A | Discharge status: Home / Self Care | Payer: PPO | Source: Ambulatory Visit | Attending: Orthopaedic Surgery | Admitting: Orthopaedic Surgery

## 2020-04-04 DIAGNOSIS — G8929 Other chronic pain: Secondary | ICD-10-CM | POA: Diagnosis not present

## 2020-04-04 DIAGNOSIS — M25562 Pain in left knee: Secondary | ICD-10-CM | POA: Diagnosis not present

## 2020-04-04 DIAGNOSIS — M23322 Other meniscus derangements, posterior horn of medial meniscus, left knee: Secondary | ICD-10-CM | POA: Diagnosis not present

## 2020-04-04 DIAGNOSIS — M23301 Other meniscus derangements, unspecified lateral meniscus, left knee: Secondary | ICD-10-CM | POA: Diagnosis not present

## 2020-04-08 ENCOUNTER — Ambulatory Visit (HOSPITAL_COMMUNITY): Payer: PPO | Admitting: Physical Therapy

## 2020-04-10 ENCOUNTER — Other Ambulatory Visit: Payer: Self-pay

## 2020-04-10 ENCOUNTER — Encounter: Payer: Self-pay | Admitting: Orthopaedic Surgery

## 2020-04-10 ENCOUNTER — Other Ambulatory Visit: Payer: Self-pay | Admitting: Internal Medicine

## 2020-04-10 ENCOUNTER — Ambulatory Visit: Payer: PPO | Admitting: Orthopaedic Surgery

## 2020-04-10 ENCOUNTER — Ambulatory Visit (HOSPITAL_COMMUNITY): Payer: PPO | Admitting: Physical Therapy

## 2020-04-10 VITALS — BP 162/83 | HR 73 | Ht 72.0 in | Wt 212.0 lb

## 2020-04-10 DIAGNOSIS — M23201 Derangement of unspecified lateral meniscus due to old tear or injury, left knee: Secondary | ICD-10-CM | POA: Diagnosis not present

## 2020-04-10 DIAGNOSIS — M25562 Pain in left knee: Secondary | ICD-10-CM

## 2020-04-10 DIAGNOSIS — M23222 Derangement of posterior horn of medial meniscus due to old tear or injury, left knee: Secondary | ICD-10-CM

## 2020-04-10 DIAGNOSIS — G8929 Other chronic pain: Secondary | ICD-10-CM

## 2020-04-10 MED ORDER — TRAMADOL HCL 50 MG PO TABS: 50.0000 mg | ORAL_TABLET | Freq: Four times a day (QID) | ORAL | 2 refills | Status: DC | PRN

## 2020-04-10 NOTE — Progress Notes (Signed)
Patient [name], male DOB:03/27/1946, 74 y.o. [name]  Chief Complaint  Patient presents with  . Knee Pain    left     HPI  [name] is a 74 y.o. male who continues to have pain of the left knee, worse some days more than others.  He is using a walker.  He has swelling and giving way.  MRI was done and showed: IMPRESSION: 1. Degenerative tear of the posterior horn and body of the medial meniscus with peripheral flap fragment adjacent to the meniscal body. 2. Degenerative tearing of the body of the lateral meniscus with peripherally displaced flap fragment. 3. Mild tricompartmental degenerative changes. No acute osseous findings. 4. Intact cruciate and collateral ligaments. 5. Small septated Baker's cyst.  I have explained the findings to him.  I will have him see Dr. Aline [name] for possible arthroscopy.  I have explained the procedure.  He is on Plavix and will need to stop this prior to the procedure.  He will call his heart doctor.  Body mass index is 28.75 kg/m.  ROS  Review of Systems  Constitutional: Positive for activity change.  Respiratory: Positive for shortness of breath.   Cardiovascular: Positive for chest pain and palpitations.  Musculoskeletal: Positive for arthralgias, back pain, gait problem and joint swelling.  All other systems reviewed and are negative.   All other systems reviewed and are negative.  The following is a summary of the past history medically, past history surgically, known current medicines, social history and family history.  This information is gathered electronically by the computer from prior information and documentation.  I review this each visit and have found including this information at this point in the chart is beneficial and informative.    Past Medical History:  Diagnosis Date  . Aftercare following surgery of the circulatory system, [place] 12/04/2013  . Angina decubitus ([place]) 05/19/2016  . Arrhythmia  05/20/2016  . Arthritis   . ARTHRITIS, RIGHT FOOT 06/26/2008   Qualifier: Diagnosis of  By: Christopher Brochure MD, [name]    . Cardiomyopathy- EF NL 01/2013, now 30-35% echo 03/14/2012  . Carotid artery occlusion    left s/p CEA  . Cellulitis of left foot 05/07/2018  . CHF (congestive heart failure) ([place])   . Critical lower limb ischemia 02/28/2014   Critical limb ischemia   . Diabetes mellitus   . Elevated troponin 04/11/2016  . GERD (gastroesophageal reflux disease)   . History of stroke June 2013 03/14/2012  . HOH (hard of hearing)   . Hypertension   . Hypothyroidism   . Nonhealing skin ulcer ([place]) 10/28/2014  . Obstructive sleep apnea   . Orthostatic hypotension   . Osteomyelitis ([place])    left great toe  . Pain in the chest 05/17/2016  . Peripheral arterial disease ([place]), s/p PTA x 2 RLE    nonhealing ulcers bilaterally on each great toe  . Pneumonia   . PONV (postoperative nausea and vomiting)   . Pulmonary nodule 05/17/2016  . Restless leg syndrome   . Shortness of breath   . Stroke [facility]) March 08, 2012  . Toe osteomyelitis, left ([place])   . Ulcer of great toe, left, with necrosis of bone ([place])   . Wears dentures     Past Surgical History:  Procedure Laterality Date  . AMPUTATION TOE Left 07/25/2018   Procedure: AMPUTATION TOE INTERPHALANGEAL HALLUX LEFT;  Surgeon: Evelina Bucy, DPM;  Location: [place];  Service: Podiatry;  Laterality: Left;  .  ANGIOPLASTY  02/28/14   diamond back orbital rotational atherectomy of Rt. tibial  . BACK SURGERY    . BONE BIOPSY Left 07/25/2018   Procedure: SUPERFICIAL BONE BIOPSY;  Surgeon: Evelina Bucy, DPM;  Location: [place];  Service: Podiatry;  Laterality: Left;  . CARDIAC CATHETERIZATION N/A 05/19/2016   Procedure: Right/Left Heart Cath and Coronary Angiography;  Surgeon: Belva Crome, MD;  Location: [place] CV LAB;  Service: Cardiovascular;  Laterality: N/A;  . CERVICAL FUSION    . ENDARTERECTOMY Left 11/29/2013   Procedure: ENDARTERECTOMY  CAROTID;  Surgeon: Serafina Mitchell, MD;  Location: [facility] OR;  Service: Vascular;  Laterality: Left;  . ESOPHAGOGASTRODUODENOSCOPY  02/2010   Dr. Gala Romney: patient presented with food impaction, schatzki ring with superimposed component of stricture with erosive reflux esophagitis, s/p disimpaction but dilation planned at later date   . ESOPHAGOGASTRODUODENOSCOPY (EGD) WITH PROPOFOL N/A 08/13/2019   Dr. Gala Romney: Erosive reflux esophagitis with mild stricture and incidental Mallory-Weiss tear which precluded esophageal dilation.  Medium sized hiatal hernia.  [name] ESOPHAGOGASTRODUODENOSCOPY (EGD) WITH PROPOFOL N/A 10/11/2019   Dr. Gala Romney: Esophageal stenosis status post dilation, moderate hiatal hernia  . EYE SURGERY    . FLEXIBLE SIGMOIDOSCOPY N/A 08/13/2019   Procedure: FLEXIBLE SIGMOIDOSCOPY;  Surgeon: [name], MD;  [facility];  Service: Endoscopy;  Laterality: N/A;  colonoscopy aboerted due to formed stool and poor prep  . FOOT SURGERY    . Lower ext duplex doppler  03/14/14   Rt ABI 1.2  . LOWER EXTREMITY ANGIOGRAM Bilateral 02/18/2014   Procedure: LOWER EXTREMITY ANGIOGRAM;  Surgeon: [name], MD;  Location: [facility] CATH LAB;  Service: Cardiovascular;  Laterality: Bilateral;  . LOWER EXTREMITY ANGIOGRAM N/A 10/31/2014   Procedure: LOWER EXTREMITY ANGIOGRAM;  Surgeon: [name], MD;  Location: [facility] C[facility] LAB;  Service: Cardiovascular;  Laterality: N/A;  . Venia Minks DILATION N/A 10/11/2019   Procedure: Venia Minks DILATION;  Surgeon: [name], MD;  [facility];  Service: Endoscopy;  Laterality: N/A;  . MULTIPLE TOOTH EXTRACTIONS    . PV angiogram  02/18/2014   tibial vessel diseas bil.  [name] SPINE SURGERY    . tendon achillies lengthing and sesamoid      Family History  Problem Relation Age of Onset  . Heart disease Mother   . Hypertension Mother   . Heart attack Mother   . Hypertension Father   . Diabetes Father   . Diabetes Son   . Heart disease Son   .  Hypertension Son   . Colon cancer Neg Hx     Social History Social History   Tobacco Use  . Smoking status: Former Smoker    Years: 1.00    Types: Pipe    Quit date: 07/08/1977    Years since quitting: 42.7  . Smokeless tobacco: Never Used  Vaping Use  . Vaping Use: Never used  Substance Use Topics  . Alcohol use: No    Alcohol/week: 0.0 standard drinks  . Drug use: No    Allergies  Allergen Reactions  . Codeine Nausea And Vomiting  . Tramadol Nausea Only    Current Outpatient Medications  Medication Sig Dispense Refill  . aspirin EC 81 MG EC tablet Take 1 tablet (81 mg total) by mouth daily. (Patient taking differently: Take 81 mg by mouth at bedtime. )    . b complex vitamins tablet Take 1 tablet by mouth daily.    . carvedilol (COREG) 3.125 MG  tablet Take 1 tablet (3.125 mg total) by mouth 2 (two) times daily with a meal. 180 tablet 3  . Cholecalciferol (VITAMIN D3) 5000 units TABS Take 5,000 Units by mouth 2 (two) times daily.     . clopidogrel (PLAVIX) 75 MG tablet Take 75 mg by mouth daily.     . Continuous Blood Gluc Receiver (FREESTYLE LIBRE 14 DAY READER) DEVI     . Continuous Blood Gluc Sensor (FREESTYLE LIBRE 14 DAY SENSOR) MISC     . Cyanocobalamin ([employer] B-12) 1500 MCG TBDP Take 1,500 mcg by mouth daily.     . diclofenac Sodium (VOLTAREN) 1 % GEL Apply 2 g topically 4 (four) times daily as needed. 50 g 0  . DULoxetine (CYMBALTA) 60 MG capsule Take 60 mg by mouth daily.    . empagliflozin (JARDIANCE) 25 MG TABS tablet Take 1 tablet (25 mg total) by mouth daily before breakfast. 30 tablet 0  . Empagliflozin-linaGLIPtin (GLYXAMBI) 10-5 MG TABS Take 1 tablet by mouth daily. 90 tablet 1  . furosemide (LASIX) 40 MG tablet TAKE ONE TABLET (40MG  TOTAL) BY MOUTH TWO TIMES DAILY. (Patient taking differently: Take 40 mg by mouth 2 (two) times daily. ) 180 tablet 3  . glimepiride (AMARYL) 4 MG tablet Take 4 mg by mouth daily with breakfast.    . levothyroxine (SYNTHROID) 75  MCG tablet Take 1 tablet (75 mcg total) by mouth daily before breakfast. 90 tablet 0  . lidocaine (LIDODERM) 5 % Place 1 patch onto the skin daily. Remove & Discard patch within 12 hours or as directed by MD 30 patch 0  . linaclotide (LINZESS) 290 MCG CAPS capsule Take 290 mcg by mouth daily before breakfast.    . Multiple Vitamin (MULTIVITAMIN WITH MINERALS) TABS tablet Take 1 tablet by mouth daily.    . nitroGLYCERIN (NITROSTAT) 0.4 MG SL tablet Place 1 tablet (0.4 mg total) under the tongue every 5 (five) minutes as needed for chest pain. 25 tablet 3  . pantoprazole (PROTONIX) 40 MG tablet Take 40 mg by mouth daily.    . potassium chloride SA (K-DUR,KLOR-CON) 20 MEQ tablet Take 1 tablet (20 mEq total) by mouth daily. Please schedule appointment for refills (Patient taking differently: Take 20 mEq by mouth every other day. Please schedule appointment for refills) 90 tablet 3  . pramipexole (MIRAPEX) 0.25 MG tablet Take 2 tablets (0.5 mg total) by mouth 2 (two) times daily. Take 0.5mg  by mouth at 7 PM and 0.5mg  at 9 PM daily. 360 tablet 3  . rosuvastatin (CRESTOR) 20 MG tablet Take 20 mg by mouth daily.    . traMADol (ULTRAM) 50 MG tablet Take by mouth every 6 (six) hours as needed.     No current facility-administered medications for this visit.     Physical Exam  Blood pressure (!) 162/83, pulse 73, height 6' (1.829 m), weight (!) 212 lb (96.2 kg).  Constitutional: overall normal hygiene, normal nutrition, well developed, normal grooming, normal body habitus. Assistive device:walker  Musculoskeletal: gait and station Limp left, muscle tone and strength are normal, no tremors or atrophy is present.  .  Neurological: coordination overall normal.  Deep tendon reflex/nerve stretch intact.  Sensation normal.  Cranial nerves II-XII intact.   Skin:   Normal overall no scars, lesions, ulcers or rashes. No psoriasis.  Psychiatric: Alert and oriented x 3.  Recent memory intact, remote memory  unclear.  Normal mood and affect. Well groomed.  Good eye contact.  Cardiovascular: overall no swelling, no  varicosities, no edema bilaterally, normal temperatures of the legs and arms, no clubbing, cyanosis and good capillary refill.  Lymphatic: palpation is normal.  Left knee with effusion, crepitus, ROM 0 to 105, positive Medial [place].  NV intact.  Limp left.  All other systems reviewed and are negative   The patient has been educated about the nature of the problem(s) and counseled on treatment options.  The patient appeared to understand what I have discussed and is in agreement with it.  Encounter Diagnosis  Name Primary?  . Chronic pain of left knee Yes    PLAN Call if any problems.  Precautions discussed.  Continue current medications.   Return to clinic to see Dr. Aline [name]   I have reviewed the [facility] Controlled Substance Reporting System web site prior to prescribing narcotic medicine for this patient.   Electronically Signed Sanjuana Kava, MD 7/22/202111:34 AM

## 2020-04-14 ENCOUNTER — Other Ambulatory Visit: Payer: Self-pay

## 2020-04-14 MED ORDER — CLOPIDOGREL BISULFATE 75 MG PO TABS: 75.0000 mg | ORAL_TABLET | Freq: Every day | ORAL | 0 refills | Status: DC

## 2020-04-15 ENCOUNTER — Telehealth: Payer: Self-pay

## 2020-04-15 ENCOUNTER — Ambulatory Visit (HOSPITAL_COMMUNITY): Payer: PPO | Admitting: Physical Therapy

## 2020-04-15 NOTE — Telephone Encounter (Signed)
Noted. Spoke with [name] at [facility]. Pt is ok to do a 90 supply of his medication.

## 2020-04-15 NOTE — Telephone Encounter (Signed)
[facility] called. They received RX for Pantoprazole once daily on Friday. They would like to know if it's ok to fill a 90 day supply per pts insurance company? Please advise  [phone] [facility]

## 2020-04-15 NOTE — Telephone Encounter (Signed)
Yes, 90 day supply is fine.

## 2020-04-16 ENCOUNTER — Other Ambulatory Visit: Payer: Self-pay

## 2020-04-16 ENCOUNTER — Ambulatory Visit: Payer: PPO | Admitting: Orthopedic Surgery

## 2020-04-16 ENCOUNTER — Telehealth: Payer: Self-pay | Admitting: Cardiovascular Disease

## 2020-04-16 ENCOUNTER — Encounter: Payer: Self-pay | Admitting: Orthopedic Surgery

## 2020-04-16 VITALS — BP 127/69 | HR 79 | Ht 72.0 in | Wt 212.0 lb

## 2020-04-16 DIAGNOSIS — M1710 Unilateral primary osteoarthritis, unspecified knee: Secondary | ICD-10-CM | POA: Diagnosis not present

## 2020-04-16 DIAGNOSIS — M23301 Other meniscus derangements, unspecified lateral meniscus, left knee: Secondary | ICD-10-CM | POA: Diagnosis not present

## 2020-04-16 DIAGNOSIS — M25562 Pain in left knee: Secondary | ICD-10-CM

## 2020-04-16 DIAGNOSIS — G8929 Other chronic pain: Secondary | ICD-10-CM | POA: Diagnosis not present

## 2020-04-16 DIAGNOSIS — M23322 Other meniscus derangements, posterior horn of medial meniscus, left knee: Secondary | ICD-10-CM | POA: Diagnosis not present

## 2020-04-16 NOTE — Telephone Encounter (Signed)
New Message  Patient is wanting a surgery clearance for knee surgery. Instructed patient to have office doing the surgery to give [place] a call. Called office doing the surgery per patient request, office will have Amy call back with information needed for the clearance. Please call office at [phone] to provide information needed to enter surgical clearance.

## 2020-04-16 NOTE — Progress Notes (Signed)
NEW PROBLEM//OFFICE VISIT  Chief Complaint  Patient presents with  . Knee Pain    Lt knee pain. Pain stared after fall approx 3 mos ago.   Referral from Dr Luna Glasgow    74 year old male with multiple medical problems fell and injured his left knee about 3 months ago.  He was seen by Dr. Luna Glasgow MRI was ordered along with x-rays.  His MRI shows torn medial lateral meniscus 3 compartment arthritis  Patient is complaining that he cannot straighten his knee which makes it difficult for him to walk and is having significant left knee pain  Prior to injury he says he walked fairly well with a cane  Patient was advised to take Plavix although he has not taken any for about 1 year     Review of Systems  Constitutional: Negative for fever.  Respiratory: Positive for shortness of breath.   Musculoskeletal: Positive for back pain, falls and joint pain.  Neurological: Positive for focal weakness.  All other systems reviewed and are negative.    Past Medical History:  Diagnosis Date  . Aftercare following surgery of the circulatory system, [place] 12/04/2013  . Angina decubitus ([place]) 05/19/2016  . Arrhythmia 05/20/2016  . Arthritis   . ARTHRITIS, RIGHT FOOT 06/26/2008   Qualifier: Diagnosis of  By: Aline Brochure MD, Dorothyann Peng    . Cardiomyopathy- EF NL 01/2013, now 30-35% echo 03/14/2012  . Carotid artery occlusion    left s/p CEA  . Cellulitis of left foot 05/07/2018  . CHF (congestive heart failure) ([place])   . Critical lower limb ischemia 02/28/2014   Critical limb ischemia   . Diabetes mellitus   . Elevated troponin 04/11/2016  . GERD (gastroesophageal reflux disease)   . History of stroke June 2013 03/14/2012  . HOH (hard of hearing)   . Hypertension   . Hypothyroidism   . Nonhealing skin ulcer ([place]) 10/28/2014  . Obstructive sleep apnea   . Orthostatic hypotension   . Osteomyelitis ([place])    left great toe  . Pain in the chest 05/17/2016  . Peripheral arterial disease ([place]), s/p PTA x 2 RLE     nonhealing ulcers bilaterally on each great toe  . Pneumonia   . PONV (postoperative nausea and vomiting)   . Pulmonary nodule 05/17/2016  . Restless leg syndrome   . Shortness of breath   . Stroke [facility]) March 08, 2012  . Toe osteomyelitis, left ([place])   . Ulcer of great toe, left, with necrosis of bone ([place])   . Wears dentures     Past Surgical History:  Procedure Laterality Date  . AMPUTATION TOE Left 07/25/2018   Procedure: AMPUTATION TOE INTERPHALANGEAL HALLUX LEFT;  Surgeon: Evelina Bucy, DPM;  Location: [place];  Service: Podiatry;  Laterality: Left;  . ANGIOPLASTY  02/28/14   diamond back orbital rotational atherectomy of Rt. tibial  . BACK SURGERY    . BONE BIOPSY Left 07/25/2018   Procedure: SUPERFICIAL BONE BIOPSY;  Surgeon: Evelina Bucy, DPM;  Location: [place];  Service: Podiatry;  Laterality: Left;  . CARDIAC CATHETERIZATION N/A 05/19/2016   Procedure: Right/Left Heart Cath and Coronary Angiography;  Surgeon: Belva Crome, MD;  Location: [place] CV LAB;  Service: Cardiovascular;  Laterality: N/A;  . CERVICAL FUSION    . ENDARTERECTOMY Left 11/29/2013   Procedure: ENDARTERECTOMY CAROTID;  Surgeon: Serafina Mitchell, MD;  Location: [place];  Service: Vascular;  Laterality: Left;  . ESOPHAGOGASTRODUODENOSCOPY  02/2010   Dr. Gala Romney: patient presented  with food impaction, schatzki ring with superimposed component of stricture with erosive reflux esophagitis, s/p disimpaction but dilation planned at later date   . ESOPHAGOGASTRODUODENOSCOPY (EGD) WITH PROPOFOL N/A 08/13/2019   Dr. Gala Romney: Erosive reflux esophagitis with mild stricture and incidental Mallory-Weiss tear which precluded esophageal dilation.  Medium sized hiatal hernia.  [name] ESOPHAGOGASTRODUODENOSCOPY (EGD) WITH PROPOFOL N/A 10/11/2019   Dr. Gala Romney: Esophageal stenosis status post dilation, moderate hiatal hernia  . EYE SURGERY    . FLEXIBLE SIGMOIDOSCOPY N/A 08/13/2019   Procedure: FLEXIBLE SIGMOIDOSCOPY;  Surgeon:  [name], MD;  [facility];  Service: Endoscopy;  Laterality: N/A;  colonoscopy aboerted due to formed stool and poor prep  . FOOT SURGERY    . Lower ext duplex doppler  03/14/14   Rt ABI 1.2  . LOWER EXTREMITY ANGIOGRAM Bilateral 02/18/2014   Procedure: LOWER EXTREMITY ANGIOGRAM;  Surgeon: [name], MD;  Location: [facility] CATH LAB;  Service: Cardiovascular;  Laterality: Bilateral;  . LOWER EXTREMITY ANGIOGRAM N/A 10/31/2014   Procedure: LOWER EXTREMITY ANGIOGRAM;  Surgeon: [name], MD;  Location: [facility] CATH LAB;  Service: Cardiovascular;  Laterality: N/A;  . Venia Minks DILATION N/A 10/11/2019   Procedure: Venia Minks DILATION;  Surgeon: [name], MD;  [facility];  Service: Endoscopy;  Laterality: N/A;  . MULTIPLE TOOTH EXTRACTIONS    . PV angiogram  02/18/2014   tibial vessel diseas bil.  [name] SPINE SURGERY    . tendon achillies lengthing and sesamoid      Family History  Problem Relation Age of Onset  . Heart disease Mother   . Hypertension Mother   . Heart attack Mother   . Hypertension Father   . Diabetes Father   . Diabetes Son   . Heart disease Son   . Hypertension Son   . Colon cancer Neg Hx    Social History   Tobacco Use  . Smoking status: Former Smoker    Years: 1.00    Types: Pipe    Quit date: 07/08/1977    Years since quitting: 42.8  . Smokeless tobacco: Never Used  Vaping Use  . Vaping Use: Never used  Substance Use Topics  . Alcohol use: No    Alcohol/week: 0.0 standard drinks  . Drug use: No    Allergies  Allergen Reactions  . Codeine Nausea And Vomiting  . Tramadol Nausea Only    Current Meds  Medication Sig  . aspirin EC 81 MG EC tablet Take 1 tablet (81 mg total) by mouth daily. (Patient taking differently: Take 81 mg by mouth at bedtime. )  . b complex vitamins tablet Take 1 tablet by mouth daily.  . carvedilol (COREG) 3.125 MG tablet Take 1 tablet (3.125 mg total) by mouth 2 (two) times daily with a meal.  .  Cholecalciferol (VITAMIN D3) 5000 units TABS Take 5,000 Units by mouth 2 (two) times daily.   . clopidogrel (PLAVIX) 75 MG tablet Take 1 tablet (75 mg total) by mouth daily.  . Continuous Blood Gluc Receiver (FREESTYLE LIBRE 14 DAY READER) DEVI   . Continuous Blood Gluc Sensor (FREESTYLE LIBRE 14 DAY SENSOR) MISC   . Cyanocobalamin ([employer]S B-12) 1500 MCG TBDP Take 1,500 mcg by mouth daily.   . diclofenac Sodium (VOLTAREN) 1 % GEL Apply 2 g topically 4 (four) times daily as needed.  . DULoxetine (CYMBALTA) 60 MG capsule Take 60 mg by mouth daily.  . empagliflozin (JARDIANCE) 25 MG TABS tablet Take 1 tablet (  25 mg total) by mouth daily before breakfast.  . Empagliflozin-linaGLIPtin (GLYXAMBI) 10-5 MG TABS Take 1 tablet by mouth daily.  . furosemide (LASIX) 40 MG tablet TAKE ONE TABLET (40MG  TOTAL) BY MOUTH TWO TIMES DAILY. (Patient taking differently: Take 40 mg by mouth 2 (two) times daily. )  . glimepiride (AMARYL) 4 MG tablet Take 4 mg by mouth daily with breakfast.  . levothyroxine (SYNTHROID) 75 MCG tablet Take 1 tablet (75 mcg total) by mouth daily before breakfast.  . lidocaine (LIDODERM) 5 % Place 1 patch onto the skin daily. Remove & Discard patch within 12 hours or as directed by MD  . linaclotide (LINZESS) 290 MCG CAPS capsule Take 290 mcg by mouth daily before breakfast.  . Multiple Vitamin (MULTIVITAMIN WITH MINERALS) TABS tablet Take 1 tablet by mouth daily.  . nitroGLYCERIN (NITROSTAT) 0.4 MG SL tablet Place 1 tablet (0.4 mg total) under the tongue every 5 (five) minutes as needed for chest pain.  . pantoprazole (PROTONIX) 40 MG tablet TAKE ONE (1) TABLET BY MOUTH EVERY DAY  . potassium chloride SA (K-DUR,KLOR-CON) 20 MEQ tablet Take 1 tablet (20 mEq total) by mouth daily. Please schedule appointment for refills (Patient taking differently: Take 20 mEq by mouth every other day. Please schedule appointment for refills)  . pramipexole (MIRAPEX) 0.25 MG tablet Take 2 tablets (0.5 mg total)  by mouth 2 (two) times daily. Take 0.5mg  by mouth at 7 PM and 0.5mg  at 9 PM daily.  . rosuvastatin (CRESTOR) 20 MG tablet Take 20 mg by mouth daily.  . traMADol (ULTRAM) 50 MG tablet Take 1 tablet (50 mg total) by mouth every 6 (six) hours as needed.    BP 127/69   Pulse 79   Ht 6' (1.829 m)   Wt (!) 212 lb (96.2 kg)   BMI 28.75 kg/m   Physical Exam Constitutional:      General: He is not in acute distress.    Appearance: Normal appearance. He is normal weight. He is not ill-appearing, toxic-appearing or diaphoretic.  HENT:     Head: Normocephalic and atraumatic.     Nose: No congestion or rhinorrhea.     Mouth/Throat:     Mouth: Mucous membranes are moist.  Eyes:     General: No scleral icterus.       Right eye: No discharge.        Left eye: No discharge.     Extraocular Movements: Extraocular movements intact.     Conjunctiva/sclera: Conjunctivae normal.     Pupils: Pupils are equal, round, and reactive to light.  Musculoskeletal:     Left knee: No effusion.  Neurological:     Mental Status: He is alert and oriented to person, place, and time.     Gait: Gait abnormal.  Psychiatric:        Mood and Affect: Mood normal.        Behavior: Behavior normal.        Thought Content: Thought content normal.        Judgment: Judgment normal.     Left Knee Exam   Muscle Strength  The patient has normal left knee strength.  Tenderness  The patient is experiencing tenderness in the medial joint line and lateral joint line.  Range of Motion  Extension: abnormal  Flexion: abnormal   Tests  Drawer:  Anterior - negative     Posterior - negative  Other  Erythema: absent Scars: absent Sensation: normal Effusion: no effusion present  MEDICAL DECISION MAKING  A.  Encounter Diagnoses  Name Primary?  . Chronic pain of left knee Yes  . Derangement of posterior horn of medial meniscus of left knee   . Lateral meniscus derangement, left   . Primary localized  osteoarthritis of knee     B. DATA ANALYSED:    IMAGING: Independent interpretation of images: X-rays were done in the office on July 13, alignment is normal.  Mild joint space narrowing lateral tibial femoral margin and symmetric narrowing across the medial compartment.  Oblique x-rays did not show any exacerbation of that narrowing.  He has some mild patellofemoral disease with inferior and superior osteophytes  MRI was done at [facility] dated July 16 report was read as medial lateral meniscal tears I only see a medial tear questionable lateral tear with 3 compartment arthritis  Orders: Surgery arthroscopy left knee medial lateral meniscectomy  Outside records reviewed: Records indicate patient has ejection fraction 35%, Dr. Alvester Chou is his cardiologist that echo was done in 2017  C. MANAGEMENT   CONSENT ELEMENTS  I told the patient he will probably take 8 weeks to recover he will need physical therapy afterwards to help with his balance and gait  He will have some ongoing discomfort from his arthritis  He will also need a preoperative consult with cardiology and clearance through them and then with anesthesia to approve his surgery be done at [facility]  Risks and benefits and complications explained including alternative treatments. The patient has opted for surgical treatment    Arthroscopy left knee medial and lateral menisectomy   No orders of the defined types were placed in this encounter.     Arther Abbott, MD  04/16/2020 9:48 AM

## 2020-04-16 NOTE — Patient Instructions (Addendum)
Pre-op evaluation by cardiology with Dr Alvester Chou for knee arthroscopy  Plavix will need to be stopped 48 hrs before surgery   We will see you again after the cardiology evaluation

## 2020-04-17 ENCOUNTER — Ambulatory Visit (HOSPITAL_COMMUNITY): Payer: PPO | Admitting: Physical Therapy

## 2020-04-17 ENCOUNTER — Telehealth: Payer: Self-pay | Admitting: Orthopedic Surgery

## 2020-04-17 NOTE — Telephone Encounter (Signed)
It is in a letter we sent to Dr Gwenlyn Found. Just sent.

## 2020-04-17 NOTE — Telephone Encounter (Signed)
Patient left voice message regarding cardiologist Dr Kennon Holter call to Dr Aline Brochure. Said to please let him know if he reached our office regarding clearance. Please advise, as he would like to move forward with scheduling surgery as soon as he can.

## 2020-04-18 NOTE — Telephone Encounter (Signed)
He needs a return visit, where can we get him in?

## 2020-04-21 NOTE — Telephone Encounter (Signed)
Called patient for appointment per opening Wednesday, 04/23/20.

## 2020-04-22 ENCOUNTER — Ambulatory Visit (HOSPITAL_COMMUNITY): Payer: PPO | Admitting: Physical Therapy

## 2020-04-23 ENCOUNTER — Encounter: Payer: Self-pay | Admitting: Orthopedic Surgery

## 2020-04-23 ENCOUNTER — Encounter: Payer: PPO | Admitting: Orthopedic Surgery

## 2020-04-23 ENCOUNTER — Other Ambulatory Visit: Payer: Self-pay

## 2020-04-23 ENCOUNTER — Telehealth: Payer: Self-pay | Admitting: Radiology

## 2020-04-23 ENCOUNTER — Ambulatory Visit: Payer: PPO | Admitting: Podiatry

## 2020-04-23 NOTE — Progress Notes (Signed)
Chief Complaint  Patient presents with  . Knee Pain    left/ worse since fall 1 hour ago     Pre-op clearance given by Dr Alvester Chou

## 2020-04-23 NOTE — Telephone Encounter (Signed)
I called over to Cardiology / Dr Gwenlyn Found office . The note Dr Gwenlyn Found sent can not be found. Patient was told he had clearance. The cardiology office states Dr Gwenlyn Found can not clear patient, the clearance has to go to their team that handles preop clearances, and they do not even have the information yet. The letter and the message I sent to Dr Gwenlyn Found assistant will be sent to the clearance team and they will let us know if patient is cleared  Appointment was cancelled today because we do not have clearance  To you FYI

## 2020-04-23 NOTE — Telephone Encounter (Signed)
Dr. Gwenlyn Found sent a reply that patient could d/c anticoagulants, but I can not find it, can you see it on your end? Dr Aline Brochure needs it for anesthesia.

## 2020-04-23 NOTE — Telephone Encounter (Signed)
[facility] called to check on the status of the clearance

## 2020-04-23 NOTE — Telephone Encounter (Signed)
Pre-op pool URGENT  I do not see request for clearance in usual way.  There is letter to Dr. Gwenlyn Found asking in pt's chart but not our format and I do not see that it was answered.  Please fix proper format and I will go ahead and check with Dr. Adora Fridge about plavix.& ASA.    Please let the ortho group know we did not receive letter.  Thanks.

## 2020-04-23 NOTE — Telephone Encounter (Signed)
Dr. Gwenlyn Found [name] with hx of PAD, non healing wounds, CVA, carotid disease, OSA, CHF,  And moderate diffuse CAD.  Medical therapy on ASA and plavix -- he now needs knee arthroscopy after a fall.  Can he be off ASA and PLAVIX for procedure?  His ortho had sent you a letter but I cannot find your answer.   I will call pt in addition.  Thank you.  I am sending urgent due to request has not been addressed and pt with pain.

## 2020-04-24 ENCOUNTER — Ambulatory Visit (HOSPITAL_COMMUNITY): Payer: PPO | Admitting: Physical Therapy

## 2020-04-25 NOTE — Telephone Encounter (Signed)
Attempted to reach requesting office. No answer. Lvm

## 2020-04-27 NOTE — Telephone Encounter (Signed)
OK to interrupt anti platelet Rx for knee arthroscopy

## 2020-04-28 ENCOUNTER — Telehealth: Payer: Self-pay | Admitting: Radiology

## 2020-04-28 ENCOUNTER — Telehealth: Payer: Self-pay | Admitting: Orthopedic Surgery

## 2020-04-28 ENCOUNTER — Other Ambulatory Visit: Payer: Self-pay | Admitting: Orthopedic Surgery

## 2020-04-28 NOTE — Telephone Encounter (Signed)
Call from patient, inquiring about status of communication from his cardiologist Dr Gwenlyn Found. Please advise.

## 2020-04-28 NOTE — Telephone Encounter (Signed)
   [facility] Pre-operative Risk Assessment    HEARTCARE STAFF: - Please ensure there is not already an duplicate clearance open for this procedure. - Under Visit Info/Reason for Call, type in Other and utilize the format Clearance MM/DD/YY or Clearance TBD. Do not use dashes or single digits. - If request is for dental extraction, please clarify the # of teeth to be extracted.  Request for surgical clearance:  1. What type of surgery is being performed?    2. When is this surgery scheduled?    3. What type of clearance is required (medical clearance vs. Pharmacy clearance to hold med vs. Both)?   4. Are there any medications that need to be held prior to surgery and how long?   5. Practice name and name of physician performing surgery?    6. What is the office phone number?    7.   What is the office fax number?   8.   Anesthesia type (None, local, MAC, general) ?    [name] 04/28/2020, 8:30 AM  _________________________________________________________________   (provider comments below)

## 2020-04-28 NOTE — Telephone Encounter (Signed)
You told them we would see him and discuss surgery  Do you want to call them instead to discuss surgery or have you already and I can just pick a date?

## 2020-04-28 NOTE — Telephone Encounter (Signed)
I just checked chart again and now see note   Primary Cardiologist: Quay Burow, MD  Chart reviewed as part of pre-operative protocol coverage. Given past medical history and time since last visit, based on ACC/AHA guidelines, [name] would be at acceptable risk for the planned procedure without further cardiovascular testing.   His aspirin and Plavix may be held for 2 days prior to his procedure.  Please resume as soon as hemostasis is achieved.  I will route this recommendation to the requesting party via Epic fax function and remove from pre-op pool.  Please call with questions.  Jossie Ng. Cleaver NP-C    We need to make appointment for him to come in, [place] where can we get him in?

## 2020-04-28 NOTE — Telephone Encounter (Signed)
I called him put in orders for Aug 17th. Surgery.

## 2020-04-28 NOTE — Telephone Encounter (Signed)
Aug 17th . Thank you

## 2020-04-28 NOTE — Telephone Encounter (Signed)
Pick a date let me know when  Its hard for them to travel

## 2020-04-28 NOTE — Telephone Encounter (Signed)
[place] [id number]   Dear [name]                                        Your patient is planning to undergo knee arthroscopy. He is on medication for anticoagulants and will need clearance to discontinue these for 48 hours prior to surgery and Cardiac clearance.  We would appreciate your evaluation and opinion on risk assessment. We can NOT perform the surgery until this assessment is received     []  Optimized for surgery from a cardiac standpoint only  []  Anticoagulation / Medication: ______________________ is ok to stop ________ days prior to surgery   Dr. Arther Abbott    Surgery not scheduled yet. Office number [phone]  Fax [phone] General anesthesia  Patient on Plavix

## 2020-04-28 NOTE — Telephone Encounter (Signed)
   [facility] Pre-operative Risk Assessment    HEARTCARE STAFF: - Please ensure there is not already an duplicate clearance open for this procedure. - Under Visit Info/Reason for Call, type in Other and utilize the format Clearance MM/DD/YY or Clearance TBD. Do not use dashes or single digits. - If request is for dental extraction, please clarify the # of teeth to be extracted.  Request for surgical clearance:  1. What type of surgery is being performed?  ARTHROSCOPY KNEE SURGERY   2. When is this surgery scheduled?  TBD   3. What type of clearance is required (medical clearance vs. Pharmacy clearance to hold med vs. Both)?  BOTH  4. Are there any medications that need to be held prior to surgery and how long? PLAVIX & ASPIRIN X'S  2 DAYS  5. Practice name and name of physician performing surgery?   [facility] / DR. HARRISON  6. What is the office phone number?     7.   What is the office fax number?  [phone]  8.   Anesthesia type (None, local, MAC, general) ?    Jeanann Lewandowsky 04/28/2020, 8:56 AM  _________________________________________________________________   (provider comments below)

## 2020-04-28 NOTE — Telephone Encounter (Signed)
Ok great schedule wiithout office visit

## 2020-04-28 NOTE — Telephone Encounter (Signed)
   Primary Cardiologist: Quay Burow, MD  Chart reviewed as part of pre-operative protocol coverage. Given past medical history and time since last visit, based on ACC/AHA guidelines, [name] would be at acceptable risk for the planned procedure without further cardiovascular testing.   His aspirin and Plavix may be held for 2 days prior to his procedure.  Please resume as soon as hemostasis is achieved.  I will route this recommendation to the requesting party via Epic fax function and remove from pre-op pool.  Please call with questions.  Jossie Ng. Vitalia Stough NP-C    04/28/2020, 9:09 AM [place] [address] Office [phone] Fax [phone]

## 2020-04-28 NOTE — Telephone Encounter (Signed)
I have communicated with the medical assistant there this morning to let them know we need permission for surgery (cardiac clearance and permission to hold Plavix)   Requests have been forwarded to their clearance team.  I called him to let him know.

## 2020-04-30 ENCOUNTER — Telehealth: Payer: Self-pay | Admitting: Orthopedic Surgery

## 2020-04-30 NOTE — Patient Instructions (Signed)
[name]  04/30/2020     @PREFPERIOPPHARMACY @   Your procedure is scheduled on  05/06/2020.  Report to [facility] at  [place]  A.M.  Call this number if you have problems the morning of surgery:  [phone]   Remember:  Do not eat or drink after midnight.                         Take these medicines the morning of surgery with A SIP OF WATER  Carvedilol, cymbalta, levothyroxine, protonix. DO NOT take any medications for diabetes the morning of your procedure. Make sure your last dose of eliquis is 05/03/2020.    Do not wear jewelry, make-up or nail polish.  Do not wear lotions, powders, or perfumes. Please wear deodorant and brush your teeth.  Do not shave 48 hours prior to surgery.  Men may shave face and neck.  Do not bring valuables to the hospital.  [facility] is not responsible for any belongings or valuables.  Contacts, dentures or bridgework may not be worn into surgery.  Leave your suitcase in the car.  After surgery it may be brought to your room.  For patients admitted to the hospital, discharge time will be determined by your treatment team.  Patients discharged the day of surgery will not be allowed to drive home.   Name and phone number of your driver:   family Special instructions:  None  Please read over the following fact sheets that you were given. Surgical Site Infection Prevention and Anesthesia Post-op Instructions       Arthroscopic Knee Ligament Repair, Care After This sheet gives you information about how to care for yourself after your procedure. Your health care provider may also give you more specific instructions. If you have problems or questions, contact your health care provider. What can I expect after the procedure? After the procedure, it is common to have:  Pain in your knee.  Bruising and swelling on your knee, calf, and ankle for 3-4 days.  Fatigue. Follow these instructions at home: If you have a brace or  immobilizer:  Wear the brace or immobilizer as told by your health care provider. Remove it only as told by your health care provider.  Loosen the splint or immobilizer if your toes tingle, become numb, or turn cold and blue.  Keep the brace or immobilizer clean. Bathing  Do not take baths, swim, or use a hot tub until your health care provider approves. Ask your health care provider if you can take showers.  Keep your bandage (dressing) dry until your health care provider says that it can be removed. Cover it and your brace or immobilizer with a watertight covering when you take a shower. Incision care   Follow instructions from your health care provider about how to take care of your incision. Make sure you: ? Wash your hands with soap and water before you change your bandage (dressing). If soap and water are not available, use hand sanitizer. ? Change your dressing as told by your health care provider. ? Leave stitches (sutures), skin glue, or adhesive strips in place. These skin closures may need to stay in place for 2 weeks or longer. If adhesive strip edges start to loosen and curl up, you may trim the loose edges. Do not remove adhesive strips completely unless your health care provider tells you to do that.  Check your  incision area every day for signs of infection. Check for: ? More redness, swelling, or pain. ? More fluid or blood. ? Warmth. ? Pus or a bad smell. Managing pain, stiffness, and swelling   If directed, put ice on the affected area. ? If you have a removable brace or immobilizer, remove it as told by your health care provider. ? Put ice in a plastic bag. ? Place a towel between your skin and the bag or between your brace or immobilizer and the bag. ? Leave the ice on for 20 minutes, 2-3 times a day.  Move your toes often to avoid stiffness and to lessen swelling.  Raise (elevate) the injured area above the level of your heart while you are sitting or lying  down. Driving  Do not drive until your health care provider approves. If you have a brace or immobilizer on your leg, ask your health care provider when it is safe for you to drive.  Do not drive or use heavy machinery while taking prescription pain medicine. Activity  Rest as directed. Ask your health care provider what activities are safe for you.  Do physical therapy exercises as told by your health care provider. Physical therapy will help you regain strength and motion in your knee.  Follow instructions from your health care provider about: ? When you may start motion exercises. ? When you may start riding a stationary bike and doing other low-impact activities. ? When you may start to jog and do other high-impact activities. Safety  Do not use the injured limb to support your body weight until your health care provider says that you can. Use crutches as told by your health care provider. General instructions  Do not use any products that contain nicotine or tobacco, such as cigarettes and e-cigarettes. These can delay bone healing. If you need help quitting, ask your health care provider.  To prevent or treat constipation while you are taking prescription pain medicine, your health care provider may recommend that you: ? Drink enough fluid to keep your urine clear or pale yellow. ? Take over-the-counter or prescription medicines. ? Eat foods that are high in fiber, such as fresh fruits and vegetables, whole grains, and beans. ? Limit foods that are high in fat and processed sugars, such as fried and sweet foods.  Take over-the-counter and prescription medicines only as told by your health care provider.  Keep all follow-up visits as told by your health care provider. This is important. Contact a health care provider if:  You have more redness, swelling, or pain around an incision.  You have more fluid or blood coming from an incision.  Your incision feels warm to the  touch.  You have a fever.  You have pain or swelling in your knee, and it gets worse.  You have pain that does not get better with medicine. Get help right away if:  You have trouble breathing.  You have pus or a bad smell coming from an incision.  You have numbness and tingling near the knee joint. Summary  After the procedure, it is common to have knee pain with bruising and swelling on your knee, calf, and ankle.  Icing your knee and raising your leg above the level of your heart will help control the pain and the swelling.  Do physical therapy exercises as told by your health care provider. Physical therapy will help you regain strength and motion in your knee. This information is not intended to  replace advice given to you by your health care provider. Make sure you discuss any questions you have with your health care provider. Document Revised: 08/19/2017 Document Reviewed: 08/31/2016 Elsevier Patient Education  2020 [place] Anesthesia, Adult, Care After This sheet gives you information about how to care for yourself after your procedure. Your health care provider may also give you more specific instructions. If you have problems or questions, contact your health care provider. What can I expect after the procedure? After the procedure, the following side effects are common:  Pain or discomfort at the IV site.  Nausea.  Vomiting.  Sore throat.  Trouble concentrating.  Feeling cold or chills.  Weak or tired.  Sleepiness and fatigue.  Soreness and body aches. These side effects can affect parts of the body that were not involved in surgery. Follow these instructions at home:  For at least 24 hours after the procedure:  Have a responsible adult stay with you. It is important to have someone help care for you until you are awake and alert.  Rest as needed.  Do not: ? Participate in activities in which you could fall or become  injured. ? Drive. ? Use heavy machinery. ? Drink alcohol. ? Take sleeping pills or medicines that cause drowsiness. ? Make important decisions or sign legal documents. ? Take care of children on your own. Eating and drinking  Follow any instructions from your health care provider about eating or drinking restrictions.  When you feel hungry, start by eating small amounts of foods that are soft and easy to digest (bland), such as toast. Gradually return to your regular diet.  Drink enough fluid to keep your urine pale yellow.  If you vomit, rehydrate by drinking water, juice, or clear broth. General instructions  If you have sleep apnea, surgery and certain medicines can increase your risk for breathing problems. Follow instructions from your health care provider about wearing your sleep device: ? Anytime you are sleeping, including during daytime naps. ? While taking prescription pain medicines, sleeping medicines, or medicines that make you drowsy.  Return to your normal activities as told by your health care provider. Ask your health care provider what activities are safe for you.  Take over-the-counter and prescription medicines only as told by your health care provider.  If you smoke, do not smoke without supervision.  Keep all follow-up visits as told by your health care provider. This is important. Contact a health care provider if:  You have nausea or vomiting that does not get better with medicine.  You cannot eat or drink without vomiting.  You have pain that does not get better with medicine.  You are unable to pass urine.  You develop a skin rash.  You have a fever.  You have redness around your IV site that gets worse. Get help right away if:  You have difficulty breathing.  You have chest pain.  You have blood in your urine or stool, or you vomit blood. Summary  After the procedure, it is common to have a sore throat or nausea. It is also common to  feel tired.  Have a responsible adult stay with you for the first 24 hours after general anesthesia. It is important to have someone help care for you until you are awake and alert.  When you feel hungry, start by eating small amounts of foods that are soft and easy to digest (bland), such as toast. Gradually return to your regular diet.  Drink enough fluid to keep your urine pale yellow.  Return to your normal activities as told by your health care provider. Ask your health care provider what activities are safe for you. This information is not intended to replace advice given to you by your health care provider. Make sure you discuss any questions you have with your health care provider. Document Revised: 09/09/2017 Document Reviewed: 04/22/2017 Elsevier Patient Education  [place]. How to Use Chlorhexidine for Bathing Chlorhexidine gluconate (CHG) is a germ-killing (antiseptic) solution that is used to clean the skin. It can get rid of the bacteria that normally live on the skin and can keep them away for about 24 hours. To clean your skin with CHG, you may be given:  A CHG solution to use in the shower or as part of a sponge bath.  A prepackaged cloth that contains CHG. Cleaning your skin with CHG may help lower the risk for infection:  While you are staying in the intensive care unit of the hospital.  If you have a vascular access, such as a central line, to provide short-term or long-term access to your veins.  If you have a catheter to drain urine from your bladder.  If you are on a ventilator. A ventilator is a machine that helps you breathe by moving air in and out of your lungs.  After surgery. What are the risks? Risks of using CHG include:  A skin reaction.  Hearing loss, if CHG gets in your ears.  Eye injury, if CHG gets in your eyes and is not rinsed out.  The CHG product catching fire. Make sure that you avoid smoking and flames after applying CHG to  your skin. Do not use CHG:  If you have a chlorhexidine allergy or have previously reacted to chlorhexidine.  On babies younger than 41 months of age. How to use CHG solution  Use CHG only as told by your health care provider, and follow the instructions on the label.  Use the full amount of CHG as directed. Usually, this is one bottle. During a shower Follow these steps when using CHG solution during a shower (unless your health care provider gives you different instructions): 1. Start the shower. 2. Use your normal soap and shampoo to wash your face and hair. 3. Turn off the shower or move out of the shower stream. 4. Pour the CHG onto a clean washcloth. Do not use any type of brush or rough-edged sponge. 5. Starting at your neck, lather your body down to your toes. Make sure you follow these instructions: ? If you will be having surgery, pay special attention to the part of your body where you will be having surgery. Scrub this area for at least 1 minute. ? Do not use CHG on your head or face. If the solution gets into your ears or eyes, rinse them well with water. ? Avoid your genital area. ? Avoid any areas of skin that have broken skin, cuts, or scrapes. ? Scrub your back and under your arms. Make sure to wash skin folds. 6. Let the lather sit on your skin for 1-2 minutes or as long as told by your health care provider. 7. Thoroughly rinse your entire body in the shower. Make sure that all body creases and crevices are rinsed well. 8. Dry off with a clean towel. Do not put any substances on your body afterward--such as powder, lotion, or perfume--unless you are told to do so by your  health care provider. Only use lotions that are recommended by the manufacturer. 9. Put on clean clothes or pajamas. 10. If it is the night before your surgery, sleep in clean sheets.  During a sponge bath Follow these steps when using CHG solution during a sponge bath (unless your health care provider  gives you different instructions): 1. Use your normal soap and shampoo to wash your face and hair. 2. Pour the CHG onto a clean washcloth. 3. Starting at your neck, lather your body down to your toes. Make sure you follow these instructions: ? If you will be having surgery, pay special attention to the part of your body where you will be having surgery. Scrub this area for at least 1 minute. ? Do not use CHG on your head or face. If the solution gets into your ears or eyes, rinse them well with water. ? Avoid your genital area. ? Avoid any areas of skin that have broken skin, cuts, or scrapes. ? Scrub your back and under your arms. Make sure to wash skin folds. 4. Let the lather sit on your skin for 1-2 minutes or as long as told by your health care provider. 5. Using a different clean, wet washcloth, thoroughly rinse your entire body. Make sure that all body creases and crevices are rinsed well. 6. Dry off with a clean towel. Do not put any substances on your body afterward--such as powder, lotion, or perfume--unless you are told to do so by your health care provider. Only use lotions that are recommended by the manufacturer. 7. Put on clean clothes or pajamas. 8. If it is the night before your surgery, sleep in clean sheets. How to use CHG prepackaged cloths  Only use CHG cloths as told by your health care provider, and follow the instructions on the label.  Use the CHG cloth on clean, dry skin.  Do not use the CHG cloth on your head or face unless your health care provider tells you to.  When washing with the CHG cloth: ? Avoid your genital area. ? Avoid any areas of skin that have broken skin, cuts, or scrapes. Before surgery Follow these steps when using a CHG cloth to clean before surgery (unless your health care provider gives you different instructions): 1. Using the CHG cloth, vigorously scrub the part of your body where you will be having surgery. Scrub using a back-and-forth  motion for 3 minutes. The area on your body should be completely wet with CHG when you are done scrubbing. 2. Do not rinse. Discard the cloth and let the area air-dry. Do not put any substances on the area afterward, such as powder, lotion, or perfume. 3. Put on clean clothes or pajamas. 4. If it is the night before your surgery, sleep in clean sheets.  For general bathing Follow these steps when using CHG cloths for general bathing (unless your health care provider gives you different instructions). 1. Use a separate CHG cloth for each area of your body. Make sure you wash between any folds of skin and between your fingers and toes. Wash your body in the following order, switching to a new cloth after each step: ? The front of your neck, shoulders, and chest. ? Both of your arms, under your arms, and your hands. ? Your stomach and groin area, avoiding the genitals. ? Your right leg and foot. ? Your left leg and foot. ? The back of your neck, your back, and your buttocks. 2. Do not  rinse. Discard the cloth and let the area air-dry. Do not put any substances on your body afterward--such as powder, lotion, or perfume--unless you are told to do so by your health care provider. Only use lotions that are recommended by the manufacturer. 3. Put on clean clothes or pajamas. Contact a health care provider if:  Your skin gets irritated after scrubbing.  You have questions about using your solution or cloth. Get help right away if:  Your eyes become very red or swollen.  Your eyes itch badly.  Your skin itches badly and is red or swollen.  Your hearing changes.  You have trouble seeing.  You have swelling or tingling in your mouth or throat.  You have trouble breathing.  You swallow any chlorhexidine. Summary  Chlorhexidine gluconate (CHG) is a germ-killing (antiseptic) solution that is used to clean the skin. Cleaning your skin with CHG may help to lower your risk for infection.  You  may be given CHG to use for bathing. It may be in a bottle or in a prepackaged cloth to use on your skin. Carefully follow your health care provider's instructions and the instructions on the product label.  Do not use CHG if you have a chlorhexidine allergy.  Contact your health care provider if your skin gets irritated after scrubbing. This information is not intended to replace advice given to you by your health care provider. Make sure you discuss any questions you have with your health care provider. Document Revised: 11/23/2018 Document Reviewed: 08/04/2017 Elsevier Patient Education  [place].

## 2020-04-30 NOTE — Telephone Encounter (Signed)
He asked about activity level after the surgery. I have advised him he will use walker for 1-2 weeks after the surgery . He was worried he would be in bed for 8 weeks. I advised we do not recommend bed rest, we need him to be up and mobile soon after the surgery  He voiced understanding  To you FYI

## 2020-04-30 NOTE — Telephone Encounter (Signed)
[name] called and asked that you call him back.  He said he has some questions  Thanks so much

## 2020-05-01 ENCOUNTER — Other Ambulatory Visit: Payer: Self-pay

## 2020-05-01 ENCOUNTER — Encounter (HOSPITAL_COMMUNITY): Payer: Self-pay

## 2020-05-01 ENCOUNTER — Encounter (HOSPITAL_COMMUNITY)
Admission: RE | Admit: 2020-05-01 | Discharge: 2020-05-01 | Disposition: A | Discharge status: Home / Self Care | Payer: PPO | Source: Ambulatory Visit | Attending: Orthopedic Surgery | Admitting: Orthopedic Surgery

## 2020-05-01 DIAGNOSIS — Z01812 Encounter for preprocedural laboratory examination: Secondary | ICD-10-CM | POA: Diagnosis not present

## 2020-05-01 LAB — CBC WITH DIFFERENTIAL/PLATELET
Abs Immature Granulocytes: 0.03 10*3/uL (ref 0.00–0.07)
Basophils Absolute: 0 10*3/uL (ref 0.0–0.1)
Basophils Relative: 1 %
Eosinophils Absolute: 0.1 10*3/uL (ref 0.0–0.5)
Eosinophils Relative: 2 %
HCT: 34.6 % — ABNORMAL LOW (ref 39.0–52.0)
Hemoglobin: 11.8 g/dL — ABNORMAL LOW (ref 13.0–17.0)
Immature Granulocytes: 1 %
Lymphocytes Relative: 18 %
Lymphs Abs: 1.1 10*3/uL (ref 0.7–4.0)
MCH: 34 pg (ref 26.0–34.0)
MCHC: 34.1 g/dL (ref 30.0–36.0)
MCV: 99.7 fL (ref 80.0–100.0)
Monocytes Absolute: 0.5 10*3/uL (ref 0.1–1.0)
Monocytes Relative: 7 %
Neutro Abs: 4.6 10*3/uL (ref 1.7–7.7)
Neutrophils Relative %: 71 %
Platelets: 232 10*3/uL (ref 150–400)
RBC: 3.47 MIL/uL — ABNORMAL LOW (ref 4.22–5.81)
RDW: 14 % (ref 11.5–15.5)
WBC: 6.4 10*3/uL (ref 4.0–10.5)
nRBC: 0.3 % — ABNORMAL HIGH (ref 0.0–0.2)

## 2020-05-01 LAB — BASIC METABOLIC PANEL
Anion gap: 13 (ref 5–15)
BUN: 16 mg/dL (ref 8–23)
CO2: 29 mmol/L (ref 22–32)
Calcium: 9.1 mg/dL (ref 8.9–10.3)
Chloride: 93 mmol/L — ABNORMAL LOW (ref 98–111)
Creatinine, Ser: 0.88 mg/dL (ref 0.61–1.24)
GFR calc Af Amer: >60 mL/min (ref >60)
GFR calc non Af Amer: >60 mL/min (ref >60)
Glucose, Bld: 315 mg/dL — ABNORMAL HIGH (ref 70–99)
Potassium: 2.8 mmol/L — ABNORMAL LOW (ref 3.5–5.1)
Sodium: 135 mmol/L (ref 135–145)

## 2020-05-01 LAB — HEMOGLOBIN A1C
Hgb A1c MFr Bld: 9.1 % — ABNORMAL HIGH (ref 4.8–5.6)
Mean Plasma Glucose: 214.47 mg/dL

## 2020-05-02 NOTE — Pre-Procedure Instructions (Signed)
Bmet routed to Dr Harrison. 

## 2020-05-05 ENCOUNTER — Other Ambulatory Visit (HOSPITAL_COMMUNITY)
Admission: RE | Admit: 2020-05-05 | Discharge: 2020-05-05 | Disposition: A | Discharge status: Home / Self Care | Payer: PPO | Source: Ambulatory Visit | Attending: Orthopedic Surgery | Admitting: Orthopedic Surgery

## 2020-05-05 ENCOUNTER — Other Ambulatory Visit: Payer: Self-pay

## 2020-05-05 DIAGNOSIS — Z01812 Encounter for preprocedural laboratory examination: Secondary | ICD-10-CM | POA: Insufficient documentation

## 2020-05-05 DIAGNOSIS — Z20822 Contact with and (suspected) exposure to covid-19: Secondary | ICD-10-CM | POA: Insufficient documentation

## 2020-05-05 LAB — SARS CORONAVIRUS 2 (TAT 6-24 HRS): SARS Coronavirus 2: NEGATIVE

## 2020-05-05 NOTE — H&P (Signed)
NEW PROBLEM//OFFICE VISIT      Chief Complaint  Patient presents with  . Knee Pain    Lt knee pain. Pain stared after fall approx 3 mos ago.   Referral from Dr Luna Glasgow    74 year old male with multiple medical problems fell and injured his left knee about 3 months ago.  He was seen by Dr. Luna Glasgow MRI was ordered along with x-rays.  His MRI shows torn medial lateral meniscus 3 compartment arthritis  Patient is complaining that he cannot straighten his knee which makes it difficult for him to walk and is having significant left knee pain  Prior to injury he says he walked fairly well with a cane  Patient was advised to take Plavix although he has not taken any for about 1 year     Review of Systems  Constitutional: Negative for fever.  Respiratory: Positive for shortness of breath.   Musculoskeletal: Positive for back pain, falls and joint pain.  Neurological: Positive for focal weakness.  All other systems reviewed and are negative.        Past Medical History:  Diagnosis Date  . Aftercare following surgery of the circulatory system, [place] 12/04/2013  . Angina decubitus ([place]) 05/19/2016  . Arrhythmia 05/20/2016  . Arthritis   . ARTHRITIS, RIGHT FOOT 06/26/2008   Qualifier: Diagnosis of  By: Aline Brochure MD, Dorothyann Peng    . Cardiomyopathy- EF NL 01/2013, now 30-35% echo 03/14/2012  . Carotid artery occlusion    left s/p CEA  . Cellulitis of left foot 05/07/2018  . CHF (congestive heart failure) ([place])   . Critical lower limb ischemia 02/28/2014   Critical limb ischemia   . Diabetes mellitus   . Elevated troponin 04/11/2016  . GERD (gastroesophageal reflux disease)   . History of stroke June 2013 03/14/2012  . HOH (hard of hearing)   . Hypertension   . Hypothyroidism   . Nonhealing skin ulcer ([place]) 10/28/2014  . Obstructive sleep apnea   . Orthostatic hypotension   . Osteomyelitis ([place])    left great toe  . Pain in the chest 05/17/2016  . Peripheral  arterial disease ([place]), s/p PTA x 2 RLE    nonhealing ulcers bilaterally on each great toe  . Pneumonia   . PONV (postoperative nausea and vomiting)   . Pulmonary nodule 05/17/2016  . Restless leg syndrome   . Shortness of breath   . Stroke [facility]) March 08, 2012  . Toe osteomyelitis, left ([place])   . Ulcer of great toe, left, with necrosis of bone ([place])   . Wears dentures     Past Surgical History:  Procedure Laterality Date  . AMPUTATION TOE Left 07/25/2018   Procedure: AMPUTATION TOE INTERPHALANGEAL HALLUX LEFT;  Surgeon: Evelina Bucy, DPM;  Location: [place];  Service: Podiatry;  Laterality: Left;  . ANGIOPLASTY  02/28/14   diamond back orbital rotational atherectomy of Rt. tibial  . BACK SURGERY    . BONE BIOPSY Left 07/25/2018   Procedure: SUPERFICIAL BONE BIOPSY;  Surgeon: Evelina Bucy, DPM;  Location: [place];  Service: Podiatry;  Laterality: Left;  . CARDIAC CATHETERIZATION N/A 05/19/2016   Procedure: Right/Left Heart Cath and Coronary Angiography;  Surgeon: Belva Crome, MD;  Location: [place] CV LAB;  Service: Cardiovascular;  Laterality: N/A;  . CERVICAL FUSION    . ENDARTERECTOMY Left 11/29/2013   Procedure: ENDARTERECTOMY CAROTID;  Surgeon: Serafina Mitchell, MD;  Location: [place];  Service: Vascular;  Laterality: Left;  . ESOPHAGOGASTRODUODENOSCOPY  02/2010   Dr. Gala Romney: patient presented with food impaction, schatzki ring with superimposed component of stricture with erosive reflux esophagitis, s/p disimpaction but dilation planned at later date   . ESOPHAGOGASTRODUODENOSCOPY (EGD) WITH PROPOFOL N/A 08/13/2019   Dr. Gala Romney: Erosive reflux esophagitis with mild stricture and incidental Mallory-Weiss tear which precluded esophageal dilation.  Medium sized hiatal hernia.  [name] ESOPHAGOGASTRODUODENOSCOPY (EGD) WITH PROPOFOL N/A 10/11/2019   Dr. Gala Romney: Esophageal stenosis status post dilation, moderate hiatal hernia  . EYE SURGERY    . FLEXIBLE  SIGMOIDOSCOPY N/A 08/13/2019   Procedure: FLEXIBLE SIGMOIDOSCOPY;  Surgeon: [name], MD;  [facility];  Service: Endoscopy;  Laterality: N/A;  colonoscopy aboerted due to formed stool and poor prep  . FOOT SURGERY    . Lower ext duplex doppler  03/14/14   Rt ABI 1.2  . LOWER EXTREMITY ANGIOGRAM Bilateral 02/18/2014   Procedure: LOWER EXTREMITY ANGIOGRAM;  Surgeon: [name], MD;  [facility] CATH LAB;  Service: Cardiovascular;  Laterality: Bilateral;  . LOWER EXTREMITY ANGIOGRAM N/A 10/31/2014   Procedure: LOWER EXTREMITY ANGIOGRAM;  Surgeon: [name], MD;  Location: [facility] CATH LAB;  Service: Cardiovascular;  Laterality: N/A;  . Venia Minks DILATION N/A 10/11/2019   Procedure: Venia Minks DILATION;  Surgeon: [name], MD;  [facility];  Service: Endoscopy;  Laterality: N/A;  . MULTIPLE TOOTH EXTRACTIONS    . PV angiogram  02/18/2014   tibial vessel diseas bil.  [name] SPINE SURGERY    . tendon achillies lengthing and sesamoid           Family History  Problem Relation Age of Onset  . Heart disease Mother   . Hypertension Mother   . Heart attack Mother   . Hypertension Father   . Diabetes Father   . Diabetes Son   . Heart disease Son   . Hypertension Son   . Colon cancer Neg Hx    Social History        Tobacco Use  . Smoking status: Former Smoker    Years: 1.00    Types: Pipe    Quit date: 07/08/1977    Years since quitting: 42.8  . Smokeless tobacco: Never Used  Vaping Use  . Vaping Use: Never used  Substance Use Topics  . Alcohol use: No    Alcohol/week: 0.0 standard drinks  . Drug use: No        Allergies  Allergen Reactions  . Codeine Nausea And Vomiting  . Tramadol Nausea Only    Active Medications      Current Meds  Medication Sig  . aspirin EC 81 MG EC tablet Take 1 tablet (81 mg total) by mouth daily. (Patient taking differently: Take 81 mg by mouth at bedtime. )  . b complex  vitamins tablet Take 1 tablet by mouth daily.  . carvedilol (COREG) 3.125 MG tablet Take 1 tablet (3.125 mg total) by mouth 2 (two) times daily with a meal.  . Cholecalciferol (VITAMIN D3) 5000 units TABS Take 5,000 Units by mouth 2 (two) times daily.   . clopidogrel (PLAVIX) 75 MG tablet Take 1 tablet (75 mg total) by mouth daily.  . Continuous Blood Gluc Receiver (FREESTYLE LIBRE 14 DAY READER) DEVI   . Continuous Blood Gluc Sensor (FREESTYLE LIBRE 14 DAY SENSOR) MISC   . Cyanocobalamin ([employer] B-12) 1500 MCG TBDP Take 1,500 mcg by mouth daily.   . diclofenac Sodium (VOLTAREN) 1 % GEL Apply 2 g topically 4 (four)  times daily as needed.  . DULoxetine (CYMBALTA) 60 MG capsule Take 60 mg by mouth daily.  . empagliflozin (JARDIANCE) 25 MG TABS tablet Take 1 tablet (25 mg total) by mouth daily before breakfast.  . Empagliflozin-linaGLIPtin (GLYXAMBI) 10-5 MG TABS Take 1 tablet by mouth daily.  . furosemide (LASIX) 40 MG tablet TAKE ONE TABLET (40MG  TOTAL) BY MOUTH TWO TIMES DAILY. (Patient taking differently: Take 40 mg by mouth 2 (two) times daily. )  . glimepiride (AMARYL) 4 MG tablet Take 4 mg by mouth daily with breakfast.  . levothyroxine (SYNTHROID) 75 MCG tablet Take 1 tablet (75 mcg total) by mouth daily before breakfast.  . lidocaine (LIDODERM) 5 % Place 1 patch onto the skin daily. Remove & Discard patch within 12 hours or as directed by MD  . linaclotide (LINZESS) 290 MCG CAPS capsule Take 290 mcg by mouth daily before breakfast.  . Multiple Vitamin (MULTIVITAMIN WITH MINERALS) TABS tablet Take 1 tablet by mouth daily.  . nitroGLYCERIN (NITROSTAT) 0.4 MG SL tablet Place 1 tablet (0.4 mg total) under the tongue every 5 (five) minutes as needed for chest pain.  . pantoprazole (PROTONIX) 40 MG tablet TAKE ONE (1) TABLET BY MOUTH EVERY DAY  . potassium chloride SA (K-DUR,KLOR-CON) 20 MEQ tablet Take 1 tablet (20 mEq total) by mouth daily. Please schedule appointment for refills (Patient taking  differently: Take 20 mEq by mouth every other day. Please schedule appointment for refills)  . pramipexole (MIRAPEX) 0.25 MG tablet Take 2 tablets (0.5 mg total) by mouth 2 (two) times daily. Take 0.5mg  by mouth at 7 PM and 0.5mg  at 9 PM daily.  . rosuvastatin (CRESTOR) 20 MG tablet Take 20 mg by mouth daily.  . traMADol (ULTRAM) 50 MG tablet Take 1 tablet (50 mg total) by mouth every 6 (six) hours as needed.      BP 127/69   Pulse 79   Ht 6' (1.829 m)   Wt (!) 212 lb (96.2 kg)   BMI 28.75 kg/m   Physical Exam Constitutional:      General: He is not in acute distress.    Appearance: Normal appearance. He is normal weight. He is not ill-appearing, toxic-appearing or diaphoretic.  HENT:     Head: Normocephalic and atraumatic.     Nose: No congestion or rhinorrhea.     Mouth/Throat:     Mouth: Mucous membranes are moist.  Eyes:     General: No scleral icterus.       Right eye: No discharge.        Left eye: No discharge.     Extraocular Movements: Extraocular movements intact.     Conjunctiva/sclera: Conjunctivae normal.     Pupils: Pupils are equal, round, and reactive to light.  Musculoskeletal:     Left knee: No effusion.  Neurological:     Mental Status: He is alert and oriented to person, place, and time.     Gait: Gait abnormal.  Psychiatric:        Mood and Affect: Mood normal.        Behavior: Behavior normal.        Thought Content: Thought content normal.        Judgment: Judgment normal.     Left Knee Exam   Muscle Strength  The patient has normal left knee strength.  Tenderness  The patient is experiencing tenderness in the medial joint line and lateral joint line.  Range of Motion  Extension: abnormal  Flexion: abnormal  Tests  Drawer:  Anterior - negative     Posterior - negative  Other  Erythema: absent Scars: absent Sensation: normal Effusion: no effusion present        MEDICAL DECISION MAKING  A.       Encounter Diagnoses  Name Primary?  . Chronic pain of left knee Yes  . Derangement of posterior horn of medial meniscus of left knee   . Lateral meniscus derangement, left   . Primary localized osteoarthritis of knee     B. DATA ANALYSED:    IMAGING: Independent interpretation of images: X-rays were done in the office on July 13, alignment is normal.  Mild joint space narrowing lateral tibial femoral margin and symmetric narrowing across the medial compartment.  Oblique x-rays did not show any exacerbation of that narrowing.  He has some mild patellofemoral disease with inferior and superior osteophytes  MRI was done at [facility] dated July 16 report was read as medial lateral meniscal tears I only see a medial tear questionable lateral tear with 3 compartment arthritis  Orders: Surgery arthroscopy left knee medial lateral meniscectomy  Outside records reviewed: Records indicate patient has ejection fraction 35%, Dr. Alvester Chou is his cardiologist that echo was done in 2017  C. MANAGEMENT   CONSENT ELEMENTS  I told the patient he will probably take 8 weeks to recover he will need physical therapy afterwards to help with his balance and gait  He will have some ongoing discomfort from his arthritis  He will also need a preoperative consult with cardiology and clearance through them and then with anesthesia to approve his surgery be done at [facility]  Risks and benefits and complications explained including alternative treatments. The patient has opted for surgical treatment    Arthroscopy left knee medial and lateral menisectomy   Addendum patient has had cardiology clearance     Arther Abbott, MD  04/16/2020 9:48 AM

## 2020-05-06 ENCOUNTER — Ambulatory Visit (HOSPITAL_COMMUNITY)
Admission: RE | Admit: 2020-05-06 | Discharge: 2020-05-06 | Disposition: A | Discharge status: Home / Self Care | Payer: PPO | Attending: Orthopedic Surgery | Admitting: Orthopedic Surgery

## 2020-05-06 ENCOUNTER — Ambulatory Visit (HOSPITAL_COMMUNITY): Payer: PPO | Admitting: Anesthesiology

## 2020-05-06 ENCOUNTER — Encounter: Payer: Self-pay | Admitting: Orthopedic Surgery

## 2020-05-06 ENCOUNTER — Telehealth: Payer: Self-pay | Admitting: Orthopedic Surgery

## 2020-05-06 ENCOUNTER — Encounter (HOSPITAL_COMMUNITY)
Admission: RE | Disposition: A | Discharge status: Home / Self Care | Payer: Self-pay | Source: Home / Self Care | Attending: Orthopedic Surgery

## 2020-05-06 ENCOUNTER — Encounter (HOSPITAL_COMMUNITY): Payer: Self-pay | Admitting: Orthopedic Surgery

## 2020-05-06 DIAGNOSIS — G4733 Obstructive sleep apnea (adult) (pediatric): Secondary | ICD-10-CM | POA: Insufficient documentation

## 2020-05-06 DIAGNOSIS — Z7982 Long term (current) use of aspirin: Secondary | ICD-10-CM | POA: Diagnosis not present

## 2020-05-06 DIAGNOSIS — Z79899 Other long term (current) drug therapy: Secondary | ICD-10-CM | POA: Diagnosis not present

## 2020-05-06 DIAGNOSIS — Z79890 Hormone replacement therapy (postmenopausal): Secondary | ICD-10-CM | POA: Insufficient documentation

## 2020-05-06 DIAGNOSIS — Z8673 Personal history of transient ischemic attack (TIA), and cerebral infarction without residual deficits: Secondary | ICD-10-CM | POA: Insufficient documentation

## 2020-05-06 DIAGNOSIS — M23322 Other meniscus derangements, posterior horn of medial meniscus, left knee: Secondary | ICD-10-CM

## 2020-05-06 DIAGNOSIS — I509 Heart failure, unspecified: Secondary | ICD-10-CM | POA: Diagnosis not present

## 2020-05-06 DIAGNOSIS — Z791 Long term (current) use of non-steroidal anti-inflammatories (NSAID): Secondary | ICD-10-CM | POA: Diagnosis not present

## 2020-05-06 DIAGNOSIS — I110 Hypertensive heart disease with heart failure: Secondary | ICD-10-CM | POA: Diagnosis not present

## 2020-05-06 DIAGNOSIS — Z7902 Long term (current) use of antithrombotics/antiplatelets: Secondary | ICD-10-CM | POA: Diagnosis not present

## 2020-05-06 DIAGNOSIS — I10 Essential (primary) hypertension: Secondary | ICD-10-CM | POA: Diagnosis not present

## 2020-05-06 DIAGNOSIS — Z7984 Long term (current) use of oral hypoglycemic drugs: Secondary | ICD-10-CM | POA: Insufficient documentation

## 2020-05-06 DIAGNOSIS — S83282A Other tear of lateral meniscus, current injury, left knee, initial encounter: Secondary | ICD-10-CM | POA: Diagnosis not present

## 2020-05-06 DIAGNOSIS — I429 Cardiomyopathy, unspecified: Secondary | ICD-10-CM | POA: Insufficient documentation

## 2020-05-06 DIAGNOSIS — E1151 Type 2 diabetes mellitus with diabetic peripheral angiopathy without gangrene: Secondary | ICD-10-CM | POA: Diagnosis not present

## 2020-05-06 DIAGNOSIS — K219 Gastro-esophageal reflux disease without esophagitis: Secondary | ICD-10-CM | POA: Insufficient documentation

## 2020-05-06 DIAGNOSIS — E119 Type 2 diabetes mellitus without complications: Secondary | ICD-10-CM | POA: Diagnosis not present

## 2020-05-06 DIAGNOSIS — M23362 Other meniscus derangements, other lateral meniscus, left knee: Secondary | ICD-10-CM | POA: Diagnosis not present

## 2020-05-06 DIAGNOSIS — E039 Hypothyroidism, unspecified: Secondary | ICD-10-CM | POA: Insufficient documentation

## 2020-05-06 DIAGNOSIS — Z87891 Personal history of nicotine dependence: Secondary | ICD-10-CM | POA: Diagnosis not present

## 2020-05-06 DIAGNOSIS — S83242A Other tear of medial meniscus, current injury, left knee, initial encounter: Secondary | ICD-10-CM | POA: Diagnosis not present

## 2020-05-06 DIAGNOSIS — W19XXXA Unspecified fall, initial encounter: Secondary | ICD-10-CM | POA: Diagnosis not present

## 2020-05-06 HISTORY — PX: KNEE ARTHROSCOPY WITH MEDIAL MENISECTOMY: SHX5651

## 2020-05-06 LAB — POCT I-STAT, CHEM 8
BUN: 12 mg/dL (ref 8–23)
Calcium, Ion: 1.1 mmol/L — ABNORMAL LOW (ref 1.15–1.40)
Chloride: 94 mmol/L — ABNORMAL LOW (ref 98–111)
Creatinine, Ser: 0.9 mg/dL (ref 0.61–1.24)
Glucose, Bld: 168 mg/dL — ABNORMAL HIGH (ref 70–99)
HCT: 35 % — ABNORMAL LOW (ref 39.0–52.0)
Hemoglobin: 11.9 g/dL — ABNORMAL LOW (ref 13.0–17.0)
Potassium: 3.4 mmol/L — ABNORMAL LOW (ref 3.5–5.1)
Sodium: 141 mmol/L (ref 135–145)
TCO2: 30 mmol/L (ref 22–32)

## 2020-05-06 LAB — GLUCOSE, CAPILLARY: Glucose-Capillary: 131 mg/dL — ABNORMAL HIGH (ref 70–99)

## 2020-05-06 SURGERY — ARTHROSCOPY, KNEE, WITH MEDIAL MENISCECTOMY
Anesthesia: General | Site: Knee | Laterality: Left

## 2020-05-06 MED ORDER — CEFAZOLIN SODIUM-DEXTROSE 2-4 GM/100ML-% IV SOLN
2.0000 g | INTRAVENOUS | Status: AC
  Administered 2020-05-06: 2 g via INTRAVENOUS

## 2020-05-06 MED ORDER — MEPERIDINE HCL 50 MG/ML IJ SOLN: 6.2500 mg | INTRAMUSCULAR | Status: DC | PRN

## 2020-05-06 MED ORDER — LACTATED RINGERS IV SOLN: INTRAVENOUS | Status: DC | PRN

## 2020-05-06 MED ORDER — ORAL CARE MOUTH RINSE: 15.0000 mL | Freq: Once | OROMUCOSAL | Status: AC

## 2020-05-06 MED ORDER — BUPIVACAINE-EPINEPHRINE (PF) 0.5% -1:200000 IJ SOLN
INTRAMUSCULAR | Status: AC
  Filled 2020-05-06: qty 60

## 2020-05-06 MED ORDER — SODIUM CHLORIDE 0.9 % IR SOLN
Status: DC | PRN
  Administered 2020-05-06: 1000 mL

## 2020-05-06 MED ORDER — BUPIVACAINE-EPINEPHRINE (PF) 0.5% -1:200000 IJ SOLN
INTRAMUSCULAR | Status: DC | PRN
  Administered 2020-05-06: 60 mL via PERINEURAL

## 2020-05-06 MED ORDER — CLOPIDOGREL BISULFATE 75 MG PO TABS: 75.0000 mg | ORAL_TABLET | Freq: Every day | ORAL | 0 refills | Status: DC

## 2020-05-06 MED ORDER — EPHEDRINE SULFATE 50 MG/ML IJ SOLN
INTRAMUSCULAR | Status: DC | PRN
  Administered 2020-05-06: 10 mg via INTRAVENOUS
  Administered 2020-05-06: 5 mg via INTRAVENOUS

## 2020-05-06 MED ORDER — ONDANSETRON HCL 4 MG/2ML IJ SOLN: 4.0000 mg | Freq: Once | INTRAMUSCULAR | Status: DC | PRN

## 2020-05-06 MED ORDER — SODIUM CHLORIDE 0.9 % IR SOLN
Status: DC | PRN
  Administered 2020-05-06 (×5): 3000 mL

## 2020-05-06 MED ORDER — HYDROMORPHONE HCL 1 MG/ML IJ SOLN
0.2500 mg | INTRAMUSCULAR | Status: DC | PRN
  Administered 2020-05-06: 0.5 mg via INTRAVENOUS
  Filled 2020-05-06: qty 0.5

## 2020-05-06 MED ORDER — EPINEPHRINE PF 1 MG/ML IJ SOLN
INTRAMUSCULAR | Status: AC
  Filled 2020-05-06: qty 4

## 2020-05-06 MED ORDER — FENTANYL CITRATE (PF) 100 MCG/2ML IJ SOLN
INTRAMUSCULAR | Status: DC | PRN
  Administered 2020-05-06: 50 ug via INTRAVENOUS
  Administered 2020-05-06: 100 ug via INTRAVENOUS

## 2020-05-06 MED ORDER — ONDANSETRON HCL 4 MG/2ML IJ SOLN
4.0000 mg | Freq: Once | INTRAMUSCULAR | Status: AC
  Administered 2020-05-06: 4 mg via INTRAVENOUS
  Filled 2020-05-06: qty 2

## 2020-05-06 MED ORDER — EPINEPHRINE PF 1 MG/ML IJ SOLN
INTRAMUSCULAR | Status: AC
  Filled 2020-05-06: qty 2

## 2020-05-06 MED ORDER — LIDOCAINE 2% (20 MG/ML) 5 ML SYRINGE
INTRAMUSCULAR | Status: AC
  Filled 2020-05-06: qty 5

## 2020-05-06 MED ORDER — METOCLOPRAMIDE HCL 5 MG/ML IJ SOLN
INTRAMUSCULAR | Status: DC | PRN
Start: 2020-05-06 — End: 2020-05-06
  Administered 2020-05-06: 10 mg via INTRAVENOUS

## 2020-05-06 MED ORDER — PROPOFOL 10 MG/ML IV BOLUS
INTRAVENOUS | Status: AC
  Filled 2020-05-06: qty 20

## 2020-05-06 MED ORDER — FENTANYL CITRATE (PF) 100 MCG/2ML IJ SOLN
INTRAMUSCULAR | Status: AC
  Filled 2020-05-06: qty 2

## 2020-05-06 MED ORDER — LIDOCAINE HCL (CARDIAC) PF 100 MG/5ML IV SOSY
PREFILLED_SYRINGE | INTRAVENOUS | Status: DC | PRN
  Administered 2020-05-06: 100 mg via INTRAVENOUS

## 2020-05-06 MED ORDER — PROPOFOL 10 MG/ML IV BOLUS
INTRAVENOUS | Status: DC | PRN
  Administered 2020-05-06 (×2): 50 mg via INTRAVENOUS

## 2020-05-06 MED ORDER — DEXAMETHASONE SODIUM PHOSPHATE 10 MG/ML IJ SOLN
INTRAMUSCULAR | Status: AC
  Filled 2020-05-06: qty 1

## 2020-05-06 MED ORDER — PROMETHAZINE HCL 12.5 MG PO TABS
12.5000 mg | ORAL_TABLET | Freq: Four times a day (QID) | ORAL | 0 refills | Status: DC | PRN
Start: 2020-05-06 — End: 2020-07-11

## 2020-05-06 MED ORDER — ETOMIDATE 2 MG/ML IV SOLN
INTRAVENOUS | Status: AC
  Filled 2020-05-06: qty 10

## 2020-05-06 MED ORDER — HYDROCODONE-ACETAMINOPHEN 10-325 MG PO TABS: 1.0000 | ORAL_TABLET | ORAL | 0 refills | Status: AC | PRN

## 2020-05-06 MED ORDER — METOCLOPRAMIDE HCL 5 MG/ML IJ SOLN
INTRAMUSCULAR | Status: AC
  Filled 2020-05-06: qty 2

## 2020-05-06 MED ORDER — ONDANSETRON HCL 4 MG/2ML IJ SOLN
INTRAMUSCULAR | Status: DC | PRN
  Administered 2020-05-06: 4 mg via INTRAVENOUS

## 2020-05-06 MED ORDER — CEFAZOLIN SODIUM-DEXTROSE 2-4 GM/100ML-% IV SOLN
INTRAVENOUS | Status: AC
  Filled 2020-05-06: qty 100

## 2020-05-06 MED ORDER — CHLORHEXIDINE GLUCONATE 0.12 % MT SOLN
15.0000 mL | Freq: Once | OROMUCOSAL | Status: AC
  Administered 2020-05-06: 15 mL via OROMUCOSAL

## 2020-05-06 MED ORDER — HYDROCODONE-ACETAMINOPHEN 7.5-325 MG PO TABS
1.0000 | ORAL_TABLET | Freq: Once | ORAL | Status: AC
  Administered 2020-05-06: 1 via ORAL
  Filled 2020-05-06: qty 1

## 2020-05-06 MED ORDER — ETOMIDATE 2 MG/ML IV SOLN
INTRAVENOUS | Status: DC | PRN
  Administered 2020-05-06 (×2): 10 mg via INTRAVENOUS

## 2020-05-06 MED ORDER — LACTATED RINGERS IV SOLN
Freq: Once | INTRAVENOUS | Status: AC
  Administered 2020-05-06: 1000 mL via INTRAVENOUS

## 2020-05-06 SURGICAL SUPPLY — 54 items
ABLATOR ASPIRATE 50D MULTI-PRT (SURGICAL WAND) ×1 IMPLANT
APL PRP STRL LF DISP 70% ISPRP (MISCELLANEOUS) ×1
BAG HAMPER (MISCELLANEOUS) ×2 IMPLANT
BANDAGE ELASTIC 6 VELCRO NS (GAUZE/BANDAGES/DRESSINGS) ×1 IMPLANT
BLADE SURG SZ11 CARB STEEL (BLADE) ×2 IMPLANT
BNDG CMPR STD VLCR NS LF 5.8X6 (GAUZE/BANDAGES/DRESSINGS) ×1
BNDG ELASTIC 6X5.8 VLCR NS LF (GAUZE/BANDAGES/DRESSINGS) ×2 IMPLANT
BRACE T-SCOPE KNEE POSTOP (MISCELLANEOUS) ×1 IMPLANT
CHLORAPREP W/TINT 26 (MISCELLANEOUS) ×2 IMPLANT
CLOTH BEACON ORANGE TIMEOUT ST (SAFETY) ×2 IMPLANT
COOLER ICEMAN CLASSIC (MISCELLANEOUS) ×2 IMPLANT
COVER WAND RF STERILE (DRAPES) ×4 IMPLANT
CUFF TOURN SGL QUICK 34 (TOURNIQUET CUFF) ×2
CUFF TRNQT CYL 34X4.125X (TOURNIQUET CUFF) IMPLANT
DECANTER SPIKE VIAL GLASS SM (MISCELLANEOUS) ×4 IMPLANT
DRAPE HALF SHEET 40X57 (DRAPES) ×2 IMPLANT
GAUZE 4X4 16PLY RFD (DISPOSABLE) ×2 IMPLANT
GAUZE SPONGE 4X4 12PLY STRL (GAUZE/BANDAGES/DRESSINGS) ×2 IMPLANT
GAUZE SPONGE 4X4 16PLY XRAY LF (GAUZE/BANDAGES/DRESSINGS) ×2 IMPLANT
GAUZE XEROFORM 5X9 LF (GAUZE/BANDAGES/DRESSINGS) ×2 IMPLANT
GLOVE BIO SURGEON STRL SZ7 (GLOVE) ×1 IMPLANT
GLOVE BIOGEL PI IND STRL 7.0 (GLOVE) ×2 IMPLANT
GLOVE BIOGEL PI INDICATOR 7.0 (GLOVE) ×2
GLOVE SKINSENSE NS SZ8.0 LF (GLOVE) ×1
GLOVE SKINSENSE STRL SZ8.0 LF (GLOVE) ×1 IMPLANT
GLOVE SS N UNI LF 8.5 STRL (GLOVE) ×2 IMPLANT
GOWN STRL REUS W/TWL LRG LVL3 (GOWN DISPOSABLE) ×2 IMPLANT
GOWN STRL REUS W/TWL XL LVL3 (GOWN DISPOSABLE) ×2 IMPLANT
IV NS IRRIG 3000ML ARTHROMATIC (IV SOLUTION) ×7 IMPLANT
KIT BLADEGUARD II DBL (SET/KITS/TRAYS/PACK) ×2 IMPLANT
KIT TURNOVER CYSTO (KITS) ×2 IMPLANT
MANIFOLD NEPTUNE II (INSTRUMENTS) ×2 IMPLANT
MARKER SKIN DUAL TIP RULER LAB (MISCELLANEOUS) ×2 IMPLANT
NDL HYPO 18GX1.5 BLUNT FILL (NEEDLE) ×1 IMPLANT
NDL HYPO 21X1.5 SAFETY (NEEDLE) ×1 IMPLANT
NDL SPNL 18GX3.5 QUINCKE PK (NEEDLE) ×1 IMPLANT
NEEDLE HYPO 18GX1.5 BLUNT FILL (NEEDLE) ×2 IMPLANT
NEEDLE HYPO 21X1.5 SAFETY (NEEDLE) ×2 IMPLANT
NEEDLE SPNL 18GX3.5 QUINCKE PK (NEEDLE) ×2 IMPLANT
NS IRRIG 1000ML POUR BTL (IV SOLUTION) ×2 IMPLANT
PACK ARTHRO LIMB DRAPE STRL (MISCELLANEOUS) ×2 IMPLANT
PAD ABD 5X9 TENDERSORB (GAUZE/BANDAGES/DRESSINGS) ×2 IMPLANT
PAD ARMBOARD 7.5X6 YLW CONV (MISCELLANEOUS) ×2 IMPLANT
PAD COLD SHLDR WRAP-ON (PAD) ×1 IMPLANT
PADDING CAST COTTON 6X4 STRL (CAST SUPPLIES) ×2 IMPLANT
PADDING WEBRIL 6 STERILE (GAUZE/BANDAGES/DRESSINGS) ×1 IMPLANT
RESECTOR TORPEDO 4MM 13CM CVD (MISCELLANEOUS) ×1 IMPLANT
SET ARTHROSCOPY INST (INSTRUMENTS) ×2 IMPLANT
SET BASIN LINEN APH (SET/KITS/TRAYS/PACK) ×2 IMPLANT
SUT ETHILON 3 0 FSL (SUTURE) ×2 IMPLANT
SYR 10ML LL (SYRINGE) ×2 IMPLANT
SYR 30ML LL (SYRINGE) ×2 IMPLANT
TUBE CONNECTING 12X1/4 (SUCTIONS) ×4 IMPLANT
TUBING IN/OUT FLOW W/MAIN PUMP (TUBING) ×2 IMPLANT

## 2020-05-06 NOTE — Interval H&P Note (Signed)
History and Physical Interval Note:  05/06/2020 9:34 AM  [name]  has presented today for surgery, with the diagnosis of Left knee torn medial and lateral meniscectomy.  The various methods of treatment have been discussed with the patient and family. After consideration of risks, benefits and other options for treatment, the patient has consented to  Procedure(s): KNEE ARTHROSCOPY WITH MEDIAL MENISCECTOMY AND LATERAL MENISCECTOMY (Left) as a surgical intervention.  The patient's history has been reviewed, patient examined, no change in status, stable for surgery.  I have reviewed the patient's chart and labs.  Questions were answered to the patient's satisfaction.     Arther Abbott

## 2020-05-06 NOTE — Telephone Encounter (Signed)
[facility] called and stated that several prescriptions were sent in for this patient.  One of the prescriptions was for Hydrocodone.  The pharmacy says they have on record that he is allergic to Hydrocodone.    Could you call to advise them [phone]?  Thanks

## 2020-05-06 NOTE — Op Note (Signed)
05/06/2020  11:46 AM  PATIENT:  [name]  74 y.o. male  PRE-OPERATIVE DIAGNOSIS:  Left knee torn medial and lateral meniscectomy  POST-OPERATIVE DIAGNOSIS:  Left knee torn medial and lateral meniscectomy  PROCEDURE:  Procedure(s): KNEE ARTHROSCOPY WITH MEDIAL MENISCECTOMY AND LATERAL MENISCECTOMY (Left) 29880  Medial meniscus posterior horn tear Osteoarthritis medial femoral condyle grade 2  Patellofemoral joint grade 2 and 3 trochlea and patella  Moderate synovitis throughout the joint  Lateral meniscus body tear Lateral femoral condyle grade 1 and 2  Surgery was done as follows Mr. Mcgibbon was seen in preop checked out and was okay for surgery chart review completed patient taken to surgery for general anesthesia patient placed supine bottom of the table down arthroscopic leg holder left knee  After sterile prep and drape lateral portal was established and the scope was placed into the joint after diagnostic arthroscopy was completed our equipment failed and we had to start new pump and new tubing which delayed the case  Once the new pump and tubing were in place I made a medial portal noted that the lateral compartment was very tight I took a spinal needle and an 11 blade and opened up the inner portion of the medial collateral ligament to gain better access to the posterior compartment  I then addressed the lateral meniscus with a shaver and a 50 degree ArthroCare wand until a balanced rim was obtained removing the inner portion of the body approximately 50%.  I then perform debridement of the synovial tissue to gain better access and visualization and then performed a medial meniscectomy with a straight biter torpedo shaver and 50 degree wand until a stable rim was obtained  I did a chondroplasty of the medial femoral condyle  Joint was irrigated and closed with 3-0 nylon sutures 1 in each portal we injected a total of 60 cc of Marcaine with epinephrine in the  joint  Sterile bandage Cryo/Cuff was placed in a hinged brace was placed.  Patient taken recovery in stable condition   SURGEON:  Surgeon(s) and Role:    * [name], MD - Primary  PHYSICIAN ASSISTANT:   ASSISTANTS: none   ANESTHESIA:   general  EBL:  5 mL   BLOOD ADMINISTERED:none  DRAINS: none   LOCAL MEDICATIONS USED:  MARCAINE     SPECIMEN:  No Specimen  DISPOSITION OF SPECIMEN:  N/A  COUNTS:  YES  TOURNIQUET:  * Missing tourniquet times found for documented tourniquets in log: [id number] *  DICTATION: .Dragon Dictation  PLAN OF CARE: Discharge to home after PACU  PATIENT DISPOSITION:  PACU - hemodynamically stable.   Delay start of Pharmacological VTE agent (>24hrs) due to surgical blood loss or risk of bleeding: not applicable

## 2020-05-06 NOTE — Telephone Encounter (Signed)
He should not be in a brace, but ace wrap perhaps if so he can unwrap make sure he is icing the knee  I left message for her to call me back

## 2020-05-06 NOTE — Anesthesia Procedure Notes (Signed)
Procedure Name: LMA Insertion Date/Time: 05/06/2020 10:04 AM Performed by: Jonna Munro, CRNA Pre-anesthesia Checklist: Patient identified, Emergency Drugs available, Suction available, Patient being monitored and Timeout performed Patient Re-evaluated:Patient Re-evaluated prior to induction Oxygen Delivery Method: Circle system utilized Preoxygenation: Pre-oxygenation with 100% oxygen Induction Type: IV induction LMA: LMA inserted LMA Size: 5.0 Number of attempts: 1 Placement Confirmation: positive ETCO2 and breath sounds checked- equal and bilateral Tube secured with: Tape Dental Injury: Teeth and Oropharynx as per pre-operative assessment

## 2020-05-06 NOTE — Anesthesia Postprocedure Evaluation (Signed)
Anesthesia Post Note  Patient: RONDELL PARDON  Procedure(s) Performed: KNEE ARTHROSCOPY WITH MEDIAL MENISCECTOMY AND LATERAL MENISCECTOMY (Left Knee)  Patient location during evaluation: PACU Anesthesia Type: General Level of consciousness: awake, oriented, awake and alert and patient cooperative Pain management: satisfactory to patient Vital Signs Assessment: post-procedure vital signs reviewed and stable Respiratory status: spontaneous breathing, respiratory function stable and nonlabored ventilation Cardiovascular status: stable Postop Assessment: no apparent nausea or vomiting Anesthetic complications: no   No complications documented.   Last Vitals:  Vitals:   05/06/20 0821  BP: (!) 168/77  Resp: 11  Temp: 36.7 C  SpO2: 96%    Last Pain:  Vitals:   05/06/20 0821  TempSrc: Oral  PainSc: 0-No pain                 Calhoun Reichardt

## 2020-05-06 NOTE — Anesthesia Preprocedure Evaluation (Signed)
Anesthesia Evaluation  Patient identified by MRN, date of birth, ID band Patient awake    Reviewed: Allergy & Precautions, NPO status , Patient's Chart, lab work & pertinent test results, reviewed documented beta blocker date and time   History of Anesthesia Complications (+) PONV and history of anesthetic complications  Airway Mallampati: II  TM Distance: >3 FB Neck ROM: Full    Dental  (+) Lower Dentures, Upper Dentures   Pulmonary shortness of breath and with exertion, sleep apnea , pneumonia, resolved, former smoker,    Pulmonary exam normal breath sounds clear to auscultation       Cardiovascular Exercise Tolerance: Poor METShypertension, Pt. on medications and Pt. on home beta blockers + angina with exertion + Peripheral Vascular Disease and +CHF (EF - 30 to 35%)  Normal cardiovascular exam Rhythm:Regular Rate:Normal  EKG - non specific T wave abnormality, prolonged QT  ECHO -2017 - EF 30 to 35% Left ventricle: The cavity size was moderately dilated. Wall   thickness was increased in a pattern of severe LVH. Systolic function was moderately to severely reduced. The estimatedejection fraction was in the range of 30% to 35%. Diffuse hypokinesis. Doppler parameters are consistent with both elevated   ventricular end-diastolic filling pressure and elevated left   atrial filling pressure. - Left atrium: The atrium was moderately to severely dilated. - Atrial septum: No defect or patent foramen ovale was identified.    Neuro/Psych Restless leg syndrome  Neuromuscular disease CVA (right sided weakness), Residual Symptoms negative psych ROS   GI/Hepatic Neg liver ROS, GERD  Medicated,  Endo/Other  diabetes, Well Controlled, Type 2Hypothyroidism   Renal/GU      Musculoskeletal  (+) Arthritis ,   Abdominal   Peds  Hematology  (+) anemia ,   Anesthesia Other Findings   Reproductive/Obstetrics                              Anesthesia Physical  Anesthesia Plan  ASA: III  Anesthesia Plan: General   Post-op Pain Management:    Induction: Intravenous  PONV Risk Score and Plan: 3 and Ondansetron and Metaclopromide  Airway Management Planned: LMA  Additional Equipment:   Intra-op Plan:   Post-operative Plan:   Informed Consent: I have reviewed the patients History and Physical, chart, labs and discussed the procedure including the risks, benefits and alternatives for the proposed anesthesia with the patient or authorized representative who has indicated his/her understanding and acceptance.     Dental advisory given  Plan Discussed with: CRNA and Surgeon  Anesthesia Plan Comments:         Anesthesia Quick Evaluation

## 2020-05-06 NOTE — Transfer of Care (Signed)
Immediate Anesthesia Transfer of Care Note  Patient: [name]  Procedure(s) Performed: KNEE ARTHROSCOPY WITH MEDIAL MENISCECTOMY AND LATERAL MENISCECTOMY (Left Knee)  Patient Location: PACU  Anesthesia Type:General  Level of Consciousness: awake, alert , oriented and patient cooperative  Airway & Oxygen Therapy: Patient Spontanous Breathing and Patient connected to face mask oxygen  Post-op Assessment: Report given to RN, Post -op Vital signs reviewed and stable and Patient moving all extremities X 4  Post vital signs: Reviewed and stable  Last Vitals:  Vitals Value Taken Time  BP 174/95 05/06/20 1147  Temp    Pulse 78 05/06/20 1148  Resp 9 05/06/20 1148  SpO2 99 % 05/06/20 1148  Vitals shown include unvalidated device data.  Last Pain:  Vitals:   05/06/20 0821  TempSrc: Oral  PainSc: 0-No pain      Patients Stated Pain Goal: 8 (62/22/97 9892)  Complications: No complications documented.

## 2020-05-06 NOTE — Telephone Encounter (Signed)
ITS NAUSEA AND VOMITING

## 2020-05-06 NOTE — Telephone Encounter (Signed)
Patient wife called and said the patient had surgery this morning and the brace is rubbing his leg.  Is there any else they can do about it.    Please call and advise patient.

## 2020-05-06 NOTE — Brief Op Note (Signed)
05/06/2020  11:46 AM  PATIENT:  [name]  74 y.o. male  PRE-OPERATIVE DIAGNOSIS:  Left knee torn medial and lateral meniscectomy  POST-OPERATIVE DIAGNOSIS:  Left knee torn medial and lateral meniscectomy  PROCEDURE:  Procedure(s): KNEE ARTHROSCOPY WITH MEDIAL MENISCECTOMY AND LATERAL MENISCECTOMY (Left) 29880  Medial meniscus posterior horn tear Osteoarthritis medial femoral condyle grade 2  Patellofemoral joint grade 2 and 3 trochlea and patella  Moderate synovitis throughout the joint  Lateral meniscus body tear Lateral femoral condyle grade 1 and 2  Surgery was done as follows [name] was seen in preop checked out and was okay for surgery chart review completed patient taken to surgery for general anesthesia patient placed supine bottom of the table down arthroscopic leg holder left knee  After sterile prep and drape lateral portal was established and the scope was placed into the joint after diagnostic arthroscopy was completed our equipment failed and we had to start new pump and new tubing which delayed the case  Once the new pump and tubing were in place I made a medial portal noted that the lateral compartment was very tight I took a spinal needle and an 11 blade and opened up the inner portion of the medial collateral ligament to gain better access to the posterior compartment  I then addressed the lateral meniscus with a shaver and a 50 degree ArthroCare wand until a balanced rim was obtained removing the inner portion of the body approximately 50%.  I then perform debridement of the synovial tissue to gain better access and visualization and then performed a medial meniscectomy with a straight biter torpedo shaver and 50 degree wand until a stable rim was obtained  I did a chondroplasty of the medial femoral condyle  Joint was irrigated and closed with 3-0 nylon sutures 1 in each portal we injected a total of 60 cc of Marcaine with epinephrine in the  joint  Sterile bandage Cryo/Cuff was placed in a hinged brace was placed.  Patient taken recovery in stable condition   SURGEON:  Surgeon(s) and Role:    * [name], MD - Primary  PHYSICIAN ASSISTANT:   ASSISTANTS: none   ANESTHESIA:   general  EBL:  5 mL   BLOOD ADMINISTERED:none  DRAINS: none   LOCAL MEDICATIONS USED:  MARCAINE     SPECIMEN:  No Specimen  DISPOSITION OF SPECIMEN:  N/A  COUNTS:  YES  TOURNIQUET:  * Missing tourniquet times found for documented tourniquets in log: [id number] *  DICTATION: .Dragon Dictation  PLAN OF CARE: Discharge to home after PACU  PATIENT DISPOSITION:  PACU - hemodynamically stable.   Delay start of Pharmacological VTE agent (>24hrs) due to surgical blood loss or risk of bleeding: not applicable

## 2020-05-07 NOTE — Telephone Encounter (Signed)
Called left another message for her to call me

## 2020-05-08 ENCOUNTER — Telehealth: Payer: Self-pay | Admitting: Orthopedic Surgery

## 2020-05-08 ENCOUNTER — Encounter (HOSPITAL_COMMUNITY): Payer: Self-pay | Admitting: Orthopedic Surgery

## 2020-05-08 NOTE — Telephone Encounter (Signed)
I am not sure what he has on there, he says something black I have told him to take it off and make sure he is using Ice  Is your new ice system black? And they do not know how to use it? I asked about ice and he said he has to get an ice pack on it  I am a little at a loss about what is on his knee? Either way, I told him to take it off use bandaids and ice  He voiced understanding

## 2020-05-08 NOTE — Telephone Encounter (Signed)
I spoke to him again, he was able to get everything off/ and has been advised to not get up without the brace on. He understands  He states he could not do the knee bend exercises because the bandage and brace did not allow his knee to extend fully.   He states he still can not extend his knee even with the brace off but will do the knee bend exercises and not WB without the knee brace

## 2020-05-08 NOTE — Telephone Encounter (Signed)
I spoke to him.

## 2020-05-08 NOTE — Telephone Encounter (Signed)
Patient called to request a return call from Amy; said needs clarification of how to remove wrap (left knee, status/post surgery 05/06/20). Please call home ph# [phone]

## 2020-05-13 ENCOUNTER — Encounter: Payer: Self-pay | Admitting: Orthopedic Surgery

## 2020-05-13 ENCOUNTER — Other Ambulatory Visit: Payer: Self-pay

## 2020-05-13 ENCOUNTER — Ambulatory Visit (INDEPENDENT_AMBULATORY_CARE_PROVIDER_SITE_OTHER): Payer: PPO | Admitting: Orthopedic Surgery

## 2020-05-13 VITALS — Ht 72.0 in | Wt 212.0 lb

## 2020-05-13 DIAGNOSIS — Z9889 Other specified postprocedural states: Secondary | ICD-10-CM

## 2020-05-13 NOTE — Patient Instructions (Signed)
Exercises   Ice as needed

## 2020-05-13 NOTE — Progress Notes (Signed)
POST OP  Chief Complaint  Patient presents with  . Routine Post Op    Lt Knee DOS 05/06/20    05/06/2020 PRE-OPERATIVE DIAGNOSIS:  Left knee torn medial and lateral meniscectomy POST-OPERATIVE DIAGNOSIS:  Left knee torn medial and lateral meniscectomy PROCEDURE:  Procedure(s): KNEE ARTHROSCOPY WITH MEDIAL MENISCECTOMY AND LATERAL MENISCECTOMY (Left) 29880   FINDINGS: Medial meniscus posterior horn tear Osteoarthritis medial femoral condyle grade 2  Patellofemoral joint grade 2 and 3 trochlea and patella  Moderate synovitis throughout the joint  Lateral meniscus body tear Lateral femoral condyle grade 1 and 2    Sutures out , wound clean  Mild swelling   Start HEP   4 weeks f/u

## 2020-05-14 ENCOUNTER — Telehealth: Payer: Self-pay | Admitting: Orthopedic Surgery

## 2020-05-14 NOTE — Telephone Encounter (Signed)
Relayed

## 2020-05-14 NOTE — Telephone Encounter (Signed)
-----   Message from Candice Camp, RT sent at 05/13/2020  2:45 PM EDT ----- Regarding: RE: Question asked at check out We gave him 3 home exercises, that is it for now.  ----- Message ----- From: Uvaldo Bristle Sent: 05/13/2020   2:21 PM EDT To: Candice Camp, RT Subject: Question asked at check out                    [name], [name] forgot to ask during today's visit - when may he be starting physical therapy?

## 2020-05-27 ENCOUNTER — Other Ambulatory Visit: Payer: Self-pay

## 2020-05-27 MED ORDER — DULOXETINE HCL 60 MG PO CPEP: 60.0000 mg | ORAL_CAPSULE | Freq: Every day | ORAL | 0 refills | Status: DC

## 2020-05-30 ENCOUNTER — Ambulatory Visit: Payer: PPO | Admitting: Family Medicine

## 2020-06-03 ENCOUNTER — Other Ambulatory Visit: Payer: Self-pay | Admitting: *Deleted

## 2020-06-03 ENCOUNTER — Ambulatory Visit (INDEPENDENT_AMBULATORY_CARE_PROVIDER_SITE_OTHER): Payer: PPO | Admitting: Family Medicine

## 2020-06-03 ENCOUNTER — Other Ambulatory Visit: Payer: Self-pay

## 2020-06-03 ENCOUNTER — Encounter: Payer: Self-pay | Admitting: Family Medicine

## 2020-06-03 VITALS — BP 139/73 | HR 83 | Temp 97.1°F | Resp 18 | Ht 72.0 in | Wt 207.4 lb

## 2020-06-03 DIAGNOSIS — E1159 Type 2 diabetes mellitus with other circulatory complications: Secondary | ICD-10-CM

## 2020-06-03 DIAGNOSIS — Z23 Encounter for immunization: Secondary | ICD-10-CM | POA: Diagnosis not present

## 2020-06-03 LAB — POCT GLYCOSYLATED HEMOGLOBIN (HGB A1C)
HbA1c POC (<> result, manual entry): 8.6 % (ref 4.0–5.6)
HbA1c, POC (controlled diabetic range): 8.6 % — AB (ref 0.0–7.0)
HbA1c, POC (prediabetic range): 8.6 % — AB (ref 5.7–6.4)
Hemoglobin A1C: 8.6 % — AB (ref 4.0–5.6)

## 2020-06-03 NOTE — Assessment & Plan Note (Addendum)
Reports that he has been out of his medication.  Medication that he was taking was a sample from previous office.  Cannot afford the prescription of this.  Restarted on Jardiance-this is too costly for him.  So he is not been taking it.  Is taking Amaryl as well.  Reports has not been checking his blood sugars as directed.  A1c in June was 10.3.  A1c in August was 9.1.  Reports that he has had an extreme change in diet and appetite.  Is not eating nearly as much as he was.  Is not against medicine adjustment.  Needs refill of supplies so he can check his blood sugars regularly.

## 2020-06-03 NOTE — Progress Notes (Signed)
Subjective:  Patient ID: [name], male    DOB: [birth year]  Age: 74 y.o. MRN: [id number]  CC:  Chief Complaint  Patient presents with  . Follow-up      HPI  HPI  [name] is a 74 year old male patient.  He has a history that is significant but not limited to CHF, diabetes, hypertension, hyperlipidemia recent fall, stroke.  He presents today for follow-up on diabetes check.  His A1c was checked early in August secondary to him having the surgery.  And it was 9.1%.  Previous A1c in June was 10.3%.  He reports he has been out of his medications in the past.  He was not able to afford Jardiance.  He has been taking his Amaryl as directed.  But has not been checking his blood sugars as directed.  So he does not know if he has had any hypoglycemic versus hyperglycemic events.   Is not eating nearly as much as he was. He denies having any headaches, vision changes, dizziness.  He needs new supplies to help get his blood sugars checked.  Denies having any sleep issues.  Reports an appetite decrease.  But no changes in chewing or swallowing or dentition.  Unsure why his appetite is decreased over the fact that he is not as active as he was.  But he is still eating pretty regularly throughout the day.  He has lost about 10 pounds in the last 2 months.  But has been down secondary to having surgery as well.  Denies having any trouble making water.  No blood in urine.  Reports some constipation post surgery is on stool softeners now.  No blood in stool.  Denies having any memory issues.  Is using his rolling walker regularly.  Has no recent falls.  Does report having some questionable sciatica on that left side.  Is going to be following up with Aline Brochure at the end of the month.  He does have some hearing loss wears hearing aids.  No vision changes outside of some retinopathy starting 2 months ago.  I have advised for him to reach out to his eye doctor to get his paperwork sent over for [place] to  place into the chart.  Otherwise he is doing well.  Today patient denies signs and symptoms of COVID 19 infection including fever, chills, cough, shortness of breath, and headache. Past Medical, Surgical, Social History, Allergies, and Medications have been Reviewed.   Past Medical History:  Diagnosis Date  . Aftercare following surgery of the circulatory system, [place] 12/04/2013  . Angina decubitus ([place]) 05/19/2016  . Arrhythmia 05/20/2016  . Arthritis   . ARTHRITIS, RIGHT FOOT 06/26/2008   Qualifier: Diagnosis of  By: Aline Brochure MD, Dorothyann Peng    . Cardiomyopathy- EF NL 01/2013, now 30-35% echo 03/14/2012  . Carotid artery occlusion    left s/p CEA  . Cellulitis of left foot 05/07/2018  . CHF (congestive heart failure) ([place])   . Critical lower limb ischemia 02/28/2014   Critical limb ischemia   . Diabetes mellitus   . Elevated troponin 04/11/2016  . GERD (gastroesophageal reflux disease)   . History of stroke June 2013 03/14/2012  . HOH (hard of hearing)   . Hypertension   . Hypothyroidism   . Nonhealing skin ulcer ([place]) 10/28/2014  . Obstructive sleep apnea   . Orthostatic hypotension   . Osteomyelitis ([place])    left great toe  . Pain in the chest 05/17/2016  .  Peripheral arterial disease ([place]), s/p PTA x 2 RLE    nonhealing ulcers bilaterally on each great toe  . Pneumonia   . PONV (postoperative nausea and vomiting)   . Pulmonary nodule 05/17/2016  . Restless leg syndrome   . Shortness of breath   . Stroke [facility]) March 08, 2012  . Toe osteomyelitis, left ([place])   . Ulcer of great toe, left, with necrosis of bone ([place])   . Wears dentures     Current Meds  Medication Sig  . aspirin EC 81 MG EC tablet Take 1 tablet (81 mg total) by mouth daily. (Patient taking differently: Take 81 mg by mouth at bedtime. )  . b complex vitamins tablet Take 1 tablet by mouth daily.  . carvedilol (COREG) 3.125 MG tablet Take 1 tablet (3.125 mg total) by mouth 2 (two) times daily with a meal.  .  Cholecalciferol (VITAMIN D3) 5000 units TABS Take 5,000 Units by mouth 2 (two) times daily.   . clopidogrel (PLAVIX) 75 MG tablet Take 1 tablet (75 mg total) by mouth daily.  . Continuous Blood Gluc Receiver (FREESTYLE LIBRE 14 DAY READER) DEVI   . Continuous Blood Gluc Sensor (FREESTYLE LIBRE 14 DAY SENSOR) MISC   . Cyanocobalamin ([employer] B-12) 1500 MCG TBDP Take 1,500 mcg by mouth daily.   . DULoxetine (CYMBALTA) 60 MG capsule Take 1 capsule (60 mg total) by mouth daily.  . empagliflozin (JARDIANCE) 25 MG TABS tablet Take 1 tablet (25 mg total) by mouth daily before breakfast.  . furosemide (LASIX) 40 MG tablet TAKE ONE TABLET (40MG  TOTAL) BY MOUTH TWO TIMES DAILY. (Patient taking differently: Take 40 mg by mouth 2 (two) times daily. )  . glimepiride (AMARYL) 4 MG tablet Take 4 mg by mouth daily with breakfast.  . levothyroxine (SYNTHROID) 75 MCG tablet Take 1 tablet (75 mcg total) by mouth daily before breakfast.  . linaclotide (LINZESS) 290 MCG CAPS capsule Take 290 mcg by mouth daily before breakfast.  . Multiple Vitamin (MULTIVITAMIN WITH MINERALS) TABS tablet Take 1 tablet by mouth daily.  . nitroGLYCERIN (NITROSTAT) 0.4 MG SL tablet Place 1 tablet (0.4 mg total) under the tongue every 5 (five) minutes as needed for chest pain.  . pantoprazole (PROTONIX) 40 MG tablet TAKE ONE (1) TABLET BY MOUTH EVERY DAY  . potassium chloride SA (K-DUR,KLOR-CON) 20 MEQ tablet Take 1 tablet (20 mEq total) by mouth daily. Please schedule appointment for refills (Patient taking differently: Take 20 mEq by mouth every other day. Please schedule appointment for refills)  . pramipexole (MIRAPEX) 0.25 MG tablet Take 2 tablets (0.5 mg total) by mouth 2 (two) times daily. Take 0.5mg  by mouth at 7 PM and 0.5mg  at 9 PM daily.  . promethazine (PHENERGAN) 12.5 MG tablet Take 1 tablet (12.5 mg total) by mouth every 6 (six) hours as needed for nausea or vomiting.  . rosuvastatin (CRESTOR) 20 MG tablet Take 20 mg by mouth  daily.  . traMADol (ULTRAM) 50 MG tablet Take 1 tablet (50 mg total) by mouth every 6 (six) hours as needed.  . [DISCONTINUED] Empagliflozin-linaGLIPtin (GLYXAMBI) 10-5 MG TABS Take 1 tablet by mouth daily.  . [DISCONTINUED] lidocaine (LIDODERM) 5 % Place 1 patch onto the skin daily. Remove & Discard patch within 12 hours or as directed by MD    ROS:  Review of Systems  Constitutional: Negative.        Decrease appetite  HENT: Negative.   Eyes: Negative.   Respiratory: Negative.  Cardiovascular: Negative.   Gastrointestinal: Positive for constipation.  Genitourinary: Negative.   Musculoskeletal: Negative.   Skin: Negative.   Neurological: Negative.        Sciatica pain  Endo/Heme/Allergies: Negative.   Psychiatric/Behavioral: Negative.      Objective:   Today's Vitals: BP 139/73 (BP Location: Right Arm, Patient Position: Sitting, Cuff Size: Normal)   Pulse 83   Temp (!) 97.1 F (36.2 C) (Temporal)   Resp 18   Ht 6' (1.829 m)   Wt 207 lb 6.4 oz (94.1 kg)   SpO2 93%   BMI 28.13 kg/m  Vitals with BMI 06/03/2020 05/13/2020 05/06/2020  Height 6\' 0"  6\' 0"  -  Weight 207 lbs 6 oz 212 lbs -  BMI 27.03 50.09 -  Systolic 381 - 829  Diastolic 73 - 81  Pulse 83 - 77     Physical Exam Vitals and nursing note reviewed.  Constitutional:      Appearance: Normal appearance. He is well-developed, well-groomed and overweight.  HENT:     Head: Normocephalic and atraumatic.     Right Ear: External ear normal.     Left Ear: External ear normal.     Mouth/Throat:     Comments: Mask in place Eyes:     General:        Right eye: No discharge.        Left eye: No discharge.     Conjunctiva/sclera: Conjunctivae normal.  Cardiovascular:     Rate and Rhythm: Normal rate and regular rhythm.     Pulses: Normal pulses.     Heart sounds: Normal heart sounds.  Pulmonary:     Effort: Pulmonary effort is normal.     Breath sounds: Normal breath sounds.  Musculoskeletal:        General:  Normal range of motion.     Cervical back: Normal range of motion and neck supple.     Comments: Rolling walker present. Ambulation much steadier than previous visit. Noted healed scars from recent knee procedure.  Skin:    General: Skin is warm.  Neurological:     General: No focal deficit present.     Mental Status: He is alert and oriented to person, place, and time.  Psychiatric:        Attention and Perception: Attention normal.        Mood and Affect: Mood normal.        Speech: Speech normal.        Behavior: Behavior normal. Behavior is cooperative.        Thought Content: Thought content normal.        Cognition and Memory: Cognition normal.        Judgment: Judgment normal.      Assessment   1. Type 2 diabetes mellitus with other circulatory complication, without long-term current use of insulin ([place])   2. Need for immunization against influenza     Tests ordered Orders Placed This Encounter  Procedures  . Flu Vaccine QUAD High Dose(Fluad)  . POCT glycosylated hemoglobin (Hb A1C)     Plan: Please see assessment and plan per problem list above.   No orders of the defined types were placed in this encounter.   Patient to follow-up in 3.5 months   Perlie Mayo, NP

## 2020-06-03 NOTE — Patient Instructions (Addendum)
I appreciate the opportunity to provide you with care for your health and wellness. Today we discussed: diabetes   Follow up: 4 weeks by phone for Blood sugar readings  No labs or referrals today  Refills today on diabetic supplies.  Please check blood sugars so we can see if you need another medication or not.  Please continue to practice social distancing to keep you, your family, and our community safe.  If you must go out, please wear a mask and practice good handwashing.  It was a pleasure to see you and I look forward to continuing to work together on your health and well-being. Please do not hesitate to call the office if you need care or have questions about your care.  Have a wonderful day and week. With Gratitude, Cherly Beach, DNP, AGNP-BC

## 2020-06-03 NOTE — Assessment & Plan Note (Signed)

## 2020-06-12 ENCOUNTER — Encounter: Payer: Self-pay | Admitting: Orthopedic Surgery

## 2020-06-12 ENCOUNTER — Other Ambulatory Visit: Payer: Self-pay

## 2020-06-12 ENCOUNTER — Ambulatory Visit (INDEPENDENT_AMBULATORY_CARE_PROVIDER_SITE_OTHER): Payer: PPO | Admitting: Orthopedic Surgery

## 2020-06-12 DIAGNOSIS — Z9889 Other specified postprocedural states: Secondary | ICD-10-CM

## 2020-06-12 NOTE — Progress Notes (Signed)
Postop visit Routine Post Op       Lt Knee DOS 05/06/20      05/06/2020  PRE-OPERATIVE DIAGNOSIS:  Left knee torn medial and lateral meniscectomy  POST-OPERATIVE DIAGNOSIS:  Left knee torn medial and lateral meniscectomy  PROCEDURE:  Procedure(s): KNEE ARTHROSCOPY WITH MEDIAL MENISCECTOMY AND LATERAL MENISCECTOMY (Left) 29880   Assessment and plan 74 year old male status post arthroscopy left knee with findings above making slow but steady progress.  Recommend home therapy with flexion to extension knee exercises terminal knee extension and straight leg raises seem to bother his back he can use his walker or cane as needed  Follow-up in 4 weeks  Exam today shows slight flexion contracture which has persisted throughout his course   Left knee looks bigger than the right but he says that is always been like that he can flex it 115 degrees he has a flexion contracture 5 to 10 degrees

## 2020-06-12 NOTE — Patient Instructions (Signed)
Use the walker for now until you feel better with ambulation

## 2020-06-23 ENCOUNTER — Other Ambulatory Visit: Payer: Self-pay | Admitting: Family Medicine

## 2020-06-24 ENCOUNTER — Other Ambulatory Visit: Payer: Self-pay | Admitting: *Deleted

## 2020-06-24 MED ORDER — ROSUVASTATIN CALCIUM 20 MG PO TABS: 20.0000 mg | ORAL_TABLET | Freq: Every day | ORAL | 0 refills | Status: DC

## 2020-06-24 MED ORDER — GLIMEPIRIDE 4 MG PO TABS: 4.0000 mg | ORAL_TABLET | Freq: Every day | ORAL | 0 refills | Status: DC

## 2020-07-04 ENCOUNTER — Ambulatory Visit: Payer: PPO | Admitting: Family Medicine

## 2020-07-10 ENCOUNTER — Other Ambulatory Visit: Payer: Self-pay

## 2020-07-10 ENCOUNTER — Telehealth: Payer: Self-pay

## 2020-07-10 ENCOUNTER — Ambulatory Visit (INDEPENDENT_AMBULATORY_CARE_PROVIDER_SITE_OTHER): Payer: PPO | Admitting: Orthopedic Surgery

## 2020-07-10 VITALS — Ht 72.0 in | Wt 207.0 lb

## 2020-07-10 DIAGNOSIS — Z9889 Other specified postprocedural states: Secondary | ICD-10-CM

## 2020-07-10 DIAGNOSIS — E08621 Diabetes mellitus due to underlying condition with foot ulcer: Secondary | ICD-10-CM

## 2020-07-10 DIAGNOSIS — G8929 Other chronic pain: Secondary | ICD-10-CM

## 2020-07-10 DIAGNOSIS — M25562 Pain in left knee: Secondary | ICD-10-CM

## 2020-07-10 DIAGNOSIS — M23322 Other meniscus derangements, posterior horn of medial meniscus, left knee: Secondary | ICD-10-CM

## 2020-07-10 DIAGNOSIS — M23301 Other meniscus derangements, unspecified lateral meniscus, left knee: Secondary | ICD-10-CM

## 2020-07-10 DIAGNOSIS — M1710 Unilateral primary osteoarthritis, unspecified knee: Secondary | ICD-10-CM

## 2020-07-10 NOTE — Patient Instructions (Signed)
Home exercises  

## 2020-07-10 NOTE — Progress Notes (Signed)
Post op   Encounter Diagnoses  Name Primary?  . S/P left knee arthroscopy 05/06/20 Yes  . Chronic pain of left knee   . Derangement of posterior horn of medial meniscus of left knee   . Lateral meniscus derangement, left   . Primary localized osteoarthritis of knee     Chief Complaint  Patient presents with  . Follow-up    Recheck on left knee, DOS 05-06-20.    9 weeks post op   The knee looks good the swelling is down  He has a chronic flexion contracture in both knees from spinal stenosis   He can bend the knee and do a decent SLR   Home exercises with PEP PAD

## 2020-07-10 NOTE — Telephone Encounter (Signed)
Patient called. He stopped going to the wound center after he fell and injured his knee. He is now doing better and needs a new referral to wound care center for his ulcer. Please call to advise when this is done. Thanks

## 2020-07-11 ENCOUNTER — Telehealth (INDEPENDENT_AMBULATORY_CARE_PROVIDER_SITE_OTHER): Payer: PPO | Admitting: Family Medicine

## 2020-07-11 ENCOUNTER — Encounter: Payer: Self-pay | Admitting: Family Medicine

## 2020-07-11 VITALS — BP 139/73 | Ht 72.0 in | Wt 207.0 lb

## 2020-07-11 DIAGNOSIS — E1159 Type 2 diabetes mellitus with other circulatory complications: Secondary | ICD-10-CM | POA: Diagnosis not present

## 2020-07-11 DIAGNOSIS — I10 Essential (primary) hypertension: Secondary | ICD-10-CM | POA: Diagnosis not present

## 2020-07-11 NOTE — Assessment & Plan Note (Signed)
Overall improved. He is taking medication as directed. He will come in for urine check of microalbumin soon. Encouraged to eat a diabetic friendly diet. Will get eye dr and foot dr notes for our charting

## 2020-07-11 NOTE — Assessment & Plan Note (Signed)
Controlled, continue currently medications. Is followed by Cards as well.

## 2020-07-11 NOTE — Progress Notes (Signed)
Virtual Visit via Telephone Note   This visit type was conducted due to national recommendations for restrictions regarding the COVID-19 Pandemic (e.g. social distancing) in an effort to limit this patient's exposure and mitigate transmission in our community.  Due to his co-morbid illnesses, this patient is at least at moderate risk for complications without adequate follow up.  This format is felt to be most appropriate for this patient at this time.  The patient did not have access to video technology/had technical difficulties with video requiring transitioning to audio format only (telephone).  All issues noted in this document were discussed and addressed.  No physical exam could be performed with this format.    Evaluation Performed:  Follow-up visit  Date:  07/11/2020   ID:  [name], [name] [birth year], MRN [id number]  Patient Location: Home Provider Location: Office/Clinic  Location of Patient: Home Location of Provider: Telehealth Consent was obtain for visit to be over via telehealth. I verified that I am speaking with the correct person using two identifiers.  PCP:  Perlie Mayo, NP   Chief Complaint: Follow-up on care gaps  History of Present Illness:    [name] is a 74 y.o. male with history as stated below.  Presents today for phone follow-up regarding several care gaps.  He reports that he sees an eye doctor and has asked them to fax over the information we have not received information yet but he will ask them again to do this.  He also will be going to the office on Monday and will get a copy of it and bring it to [place].  He additionally sees a foot doctor and will have his foot exam on Monday as well.  And will provide [place] with the information and know after.  He is due for microalbumin check and a pneumonia vaccine.  He will have a nurse visit follow-up as a schedule will allow here shortly.  He denies having any sleep trouble. Denies changes in chewing  or swallowing or dentition.  Denies having any trouble making water.  No blood in urine.  Reports some constipation post surgery is on stool softeners now.  No blood in stool.  Denies having any memory issues.  Is using his rolling walker regularly.  He does have some hearing loss wears hearing aids.  No vision changes outside of some retinopathy starting 2 months ago- we need the paperwork for out charting.  The patient does not have symptoms concerning for COVID-19 infection (fever, chills, cough, or new shortness of breath).   Past Medical, Surgical, Social History, Allergies, and Medications have been Reviewed.  Past Medical History:  Diagnosis Date  . Aftercare following surgery of the circulatory system, [place] 12/04/2013  . Angina decubitus ([place]) 05/19/2016  . Arrhythmia 05/20/2016  . Arthritis   . ARTHRITIS, RIGHT FOOT 06/26/2008   Qualifier: Diagnosis of  By: Aline Brochure MD, Dorothyann Peng    . Cardiomyopathy- EF NL 01/2013, now 30-35% echo 03/14/2012  . Carotid artery occlusion    left s/p CEA  . Cellulitis of left foot 05/07/2018  . CHF (congestive heart failure) ([place])   . Critical lower limb ischemia (HCC) 02/28/2014   Critical limb ischemia   . Diabetes mellitus   . Elevated troponin 04/11/2016  . GERD (gastroesophageal reflux disease)   . History of stroke June 2013 03/14/2012  . HOH (hard of hearing)   . Hypertension   . Hypothyroidism   . Nonhealing skin ulcer (  [place]) 10/28/2014  . Obstructive sleep apnea   . Orthostatic hypotension   . Osteomyelitis ([place])    left great toe  . Pain in the chest 05/17/2016  . Peripheral arterial disease ([place]), s/p PTA x 2 RLE    nonhealing ulcers bilaterally on each great toe  . Pneumonia   . PONV (postoperative nausea and vomiting)   . Pulmonary nodule 05/17/2016  . Restless leg syndrome   . Shortness of breath   . Stroke [facility]) March 08, 2012  . Toe osteomyelitis, left ([place])   . Ulcer of great toe, left, with necrosis of bone ([place])   . Wears dentures      Past Surgical History:  Procedure Laterality Date  . AMPUTATION TOE Left 07/25/2018   Procedure: AMPUTATION TOE INTERPHALANGEAL HALLUX LEFT;  Surgeon: Evelina Bucy, DPM;  Location: [place];  Service: Podiatry;  Laterality: Left;  . ANGIOPLASTY  02/28/14   diamond back orbital rotational atherectomy of Rt. tibial  . BACK SURGERY    . BONE BIOPSY Left 07/25/2018   Procedure: SUPERFICIAL BONE BIOPSY;  Surgeon: Evelina Bucy, DPM;  Location: [place];  Service: Podiatry;  Laterality: Left;  . CARDIAC CATHETERIZATION N/A 05/19/2016   Procedure: Right/Left Heart Cath and Coronary Angiography;  Surgeon: Belva Crome, MD;  Location: [place] [facility];  Service: Cardiovascular;  Laterality: N/A;  . CERVICAL FUSION    . ENDARTERECTOMY Left 11/29/2013   Procedure: ENDARTERECTOMY CAROTID;  Surgeon: Serafina Mitchell, MD;  Location: [facility] OR;  Service: Vascular;  Laterality: Left;  . ESOPHAGOGASTRODUODENOSCOPY  02/2010   Dr. Gala Romney: patient presented with food impaction, schatzki ring with superimposed component of stricture with erosive reflux esophagitis, s/p disimpaction but dilation planned at later date   . ESOPHAGOGASTRODUODENOSCOPY (EGD) WITH PROPOFOL N/A 08/13/2019   Dr. Gala Romney: Erosive reflux esophagitis with mild stricture and incidental Mallory-Weiss tear which precluded esophageal dilation.  Medium sized hiatal hernia.  [name] ESOPHAGOGASTRODUODENOSCOPY (EGD) WITH PROPOFOL N/A 10/11/2019   Dr. Gala Romney: Esophageal stenosis status post dilation, moderate hiatal hernia  . EYE SURGERY    . FLEXIBLE SIGMOIDOSCOPY N/A 08/13/2019   Procedure: FLEXIBLE SIGMOIDOSCOPY;  Surgeon: [name], MD;  [facility];  Service: Endoscopy;  Laterality: N/A;  colonoscopy aboerted due to formed stool and poor prep  . FOOT SURGERY    . KNEE ARTHROSCOPY WITH MEDIAL MENISECTOMY Left 05/06/2020   Procedure: KNEE ARTHROSCOPY WITH MEDIAL MENISCECTOMY AND LATERAL MENISCECTOMY;  Surgeon: [name], MD;   Location: [facility];  Service: Orthopedics;  Laterality: Left;  . Lower ext duplex doppler  03/14/14   Rt ABI 1.2  . LOWER EXTREMITY ANGIOGRAM Bilateral 02/18/2014   Procedure: LOWER EXTREMITY ANGIOGRAM;  Surgeon: [name], MD;  Location: [facility] LAB;  Service: Cardiovascular;  Laterality: Bilateral;  . LOWER EXTREMITY ANGIOGRAM N/A 10/31/2014   Procedure: LOWER EXTREMITY ANGIOGRAM;  Surgeon: [name], MD;  Location: [facility] C[facility] LAB;  Service: Cardiovascular;  Laterality: N/A;  . Venia Minks DILATION N/A 10/11/2019   Procedure: Venia Minks DILATION;  Surgeon: [name], MD;  [facility];  Service: Endoscopy;  Laterality: N/A;  . MULTIPLE TOOTH EXTRACTIONS    . PV angiogram  02/18/2014   tibial vessel diseas bil.  [name] SPINE SURGERY    . tendon achillies lengthing and sesamoid       Current Meds  Medication Sig  . aspirin EC 81 MG EC tablet Take 1 tablet (81 mg total) by mouth daily. (  Patient taking differently: Take 81 mg by mouth at bedtime. )  . b complex vitamins tablet Take 1 tablet by mouth daily.  . carvedilol (COREG) 3.125 MG tablet Take 1 tablet (3.125 mg total) by mouth 2 (two) times daily with a meal.  . Cholecalciferol (VITAMIN D3) 5000 units TABS Take 5,000 Units by mouth 2 (two) times daily.   . clopidogrel (PLAVIX) 75 MG tablet Take 1 tablet (75 mg total) by mouth daily.  . Continuous Blood Gluc Receiver (FREESTYLE LIBRE 14 DAY READER) DEVI   . Continuous Blood Gluc Sensor (FREESTYLE LIBRE 14 DAY SENSOR) MISC   . Cyanocobalamin ([employer] B-12) 1500 MCG TBDP Take 1,500 mcg by mouth daily.   . DULoxetine (CYMBALTA) 60 MG capsule Take 1 capsule (60 mg total) by mouth daily.  . empagliflozin (JARDIANCE) 25 MG TABS tablet Take 1 tablet (25 mg total) by mouth daily before breakfast.  . furosemide (LASIX) 40 MG tablet TAKE ONE TABLET (40MG  TOTAL) BY MOUTH TWO TIMES DAILY. (Patient taking differently: Take 40 mg by mouth 2 (two) times daily. )  . glimepiride (AMARYL) 4 MG  tablet Take 1 tablet (4 mg total) by mouth daily with breakfast.  . levothyroxine (SYNTHROID) 75 MCG tablet Take 1 tablet (75 mcg total) by mouth daily before breakfast.  . linaclotide (LINZESS) 290 MCG CAPS capsule Take 290 mcg by mouth daily before breakfast.  . Multiple Vitamin (MULTIVITAMIN WITH MINERALS) TABS tablet Take 1 tablet by mouth daily.  . nitroGLYCERIN (NITROSTAT) 0.4 MG SL tablet Place 1 tablet (0.4 mg total) under the tongue every 5 (five) minutes as needed for chest pain.  . pantoprazole (PROTONIX) 40 MG tablet TAKE ONE (1) TABLET BY MOUTH EVERY DAY  . potassium chloride SA (K-DUR,KLOR-CON) 20 MEQ tablet Take 1 tablet (20 mEq total) by mouth daily. Please schedule appointment for refills (Patient taking differently: Take 20 mEq by mouth every other day. Please schedule appointment for refills)  . pramipexole (MIRAPEX) 0.25 MG tablet Take 2 tablets (0.5 mg total) by mouth 2 (two) times daily. Take 0.5mg  by mouth at 7 PM and 0.5mg  at 9 PM daily.  . rosuvastatin (CRESTOR) 20 MG tablet Take 1 tablet (20 mg total) by mouth daily.     Allergies:   Codeine and Tramadol   ROS:   Please see the history of present illness.    All other systems reviewed and are negative.   Labs/Other Tests and Data Reviewed:    Recent Labs: 05/01/2020: Platelets 232 05/06/2020: BUN 12; Creatinine, Ser 0.90; Hemoglobin 11.9; Potassium 3.4; Sodium 141   Recent Lipid Panel Lab Results  Component Value Date/Time   CHOL 162 11/26/2014 09:30 AM   TRIG 127 11/26/2014 09:30 AM   HDL 42 11/26/2014 09:30 AM   CHOLHDL 3.9 11/26/2014 09:30 AM   LDLCALC 95 11/26/2014 09:30 AM    Wt Readings from Last 3 Encounters:  07/11/20 207 lb (93.9 kg)  07/10/20 207 lb (93.9 kg)  06/03/20 207 lb 6.4 oz (94.1 kg)     Objective:    Vital Signs:  BP 139/73   Ht 6' (1.829 m)   Wt 207 lb (93.9 kg)   BMI 28.07 kg/m    VITAL SIGNS:  reviewed GEN:  no acute distress RESPIRATORY:  no shortness of breath in  conversation  PSYCH:  normal affect and mood   ASSESSMENT & PLAN:    1. Primary hypertension   2. Type 2 diabetes mellitus with other circulatory complication, without long-term current use  of insulin ([place])    Time:   Today, I have spent 5 minutes with the patient with telehealth technology discussing the above problems.     Medication Adjustments/Labs and Tests Ordered: Current medicines are reviewed at length with the patient today.  Concerns regarding medicines are outlined above.   Tests Ordered: No orders of the defined types were placed in this encounter.   Medication Changes: No orders of the defined types were placed in this encounter.    Note: This dictation was prepared with Dragon dictation along with smaller phrase technology. Similar sounding words can be transcribed inadequately or may not be corrected upon review. Any transcriptional errors that result from this process are unintentional.      Disposition:  Follow up 07/28/2020  Signed, Perlie Mayo, NP  07/11/2020 10:35 AM     [facility]

## 2020-07-11 NOTE — Patient Instructions (Signed)
  HAPPY FALL!  I appreciate the opportunity to provide you with care for your health and wellness. Today we discussed: care gap needs  Follow up:  Needs 2 appts- one for PNA vaccine & Urine Micro check (nurse visit soon)  & one in Jan for follow up with me  No labs or referrals today  Have a wonderful Holiday Season :)  Please continue to practice social distancing to keep you, your family, and our community safe.  If you must go out, please wear a mask and practice good handwashing.  It was a pleasure to see you and I look forward to continuing to work together on your health and well-being. Please do not hesitate to call the office if you need care or have questions about your care.  Have a wonderful day and week. With Gratitude, Cherly Beach, DNP, AGNP-BC

## 2020-07-14 ENCOUNTER — Ambulatory Visit (INDEPENDENT_AMBULATORY_CARE_PROVIDER_SITE_OTHER): Payer: PPO

## 2020-07-14 ENCOUNTER — Ambulatory Visit: Payer: PPO | Admitting: Podiatry

## 2020-07-14 ENCOUNTER — Other Ambulatory Visit: Payer: Self-pay

## 2020-07-14 ENCOUNTER — Encounter: Payer: Self-pay | Admitting: Podiatry

## 2020-07-14 DIAGNOSIS — Z89412 Acquired absence of left great toe: Secondary | ICD-10-CM

## 2020-07-14 DIAGNOSIS — E08621 Diabetes mellitus due to underlying condition with foot ulcer: Secondary | ICD-10-CM | POA: Diagnosis not present

## 2020-07-14 DIAGNOSIS — E1142 Type 2 diabetes mellitus with diabetic polyneuropathy: Secondary | ICD-10-CM

## 2020-07-14 DIAGNOSIS — L97415 Non-pressure chronic ulcer of right heel and midfoot with muscle involvement without evidence of necrosis: Secondary | ICD-10-CM

## 2020-07-14 DIAGNOSIS — B351 Tinea unguium: Secondary | ICD-10-CM

## 2020-07-14 DIAGNOSIS — E1169 Type 2 diabetes mellitus with other specified complication: Secondary | ICD-10-CM

## 2020-07-14 DIAGNOSIS — E119 Type 2 diabetes mellitus without complications: Secondary | ICD-10-CM | POA: Diagnosis not present

## 2020-07-15 NOTE — Addendum Note (Signed)
Addended by: Hardie Pulley on: 07/15/2020 08:06 AM   Modules accepted: Orders

## 2020-07-15 NOTE — Progress Notes (Signed)
Subjective: [name] presents today at risk foot care. Patient has h/o diabetic with PAD with h/o amputation of L hallux and painful mycotic nails b/l that are difficult to trim. Pain interferes with ambulation. Aggravating factors include wearing enclosed shoe gear. Pain is relieved with periodic professional debridement.  He states he has been away for a few months due to having his knee taken care of.  He states he needs Dr. March Rummage to send a new referral to  [facility] in [place] for his right foot ulcer because he has been away for a while recovering from his knee surgery. He states he has been applying Neosporin to right foot ulcer daily and keeping it clean. Denies any increased redness, drainage or pain. Denies any fever, chills, night sweats, nausea or vomiting.  Past Medical History:  Diagnosis Date  . Aftercare following surgery of the circulatory system, [place] 12/04/2013  . Angina decubitus ([place]) 05/19/2016  . Arrhythmia 05/20/2016  . Arthritis   . ARTHRITIS, RIGHT FOOT 06/26/2008   Qualifier: Diagnosis of  By: Aline Brochure MD, Dorothyann Peng    . Cardiomyopathy- EF NL 01/2013, now 30-35% echo 03/14/2012  . Carotid artery occlusion    left s/p CEA  . Cellulitis of left foot 05/07/2018  . CHF (congestive heart failure) ([place])   . Critical lower limb ischemia (HCC) 02/28/2014   Critical limb ischemia   . Diabetes mellitus   . Elevated troponin 04/11/2016  . GERD (gastroesophageal reflux disease)   . History of stroke June 2013 03/14/2012  . HOH (hard of hearing)   . Hypertension   . Hypothyroidism   . Nonhealing skin ulcer ([place]) 10/28/2014  . Obstructive sleep apnea   . Orthostatic hypotension   . Osteomyelitis ([place])    left great toe  . Pain in the chest 05/17/2016  . Peripheral arterial disease ([place]), s/p PTA x 2 RLE    nonhealing ulcers bilaterally on each great toe  . Pneumonia   . PONV (postoperative nausea and vomiting)   . Pulmonary nodule 05/17/2016  . Restless leg  syndrome   . Shortness of breath   . Stroke [facility]) March 08, 2012  . Toe osteomyelitis, left ([place])   . Ulcer of great toe, left, with necrosis of bone ([place])   . Wears dentures      Current Outpatient Medications on File Prior to Visit  Medication Sig Dispense Refill  . aspirin EC 81 MG EC tablet Take 1 tablet (81 mg total) by mouth daily. (Patient taking differently: Take 81 mg by mouth at bedtime. )    . b complex vitamins tablet Take 1 tablet by mouth daily.    . carvedilol (COREG) 3.125 MG tablet Take 1 tablet (3.125 mg total) by mouth 2 (two) times daily with a meal. 180 tablet 3  . Cholecalciferol (VITAMIN D3) 5000 units TABS Take 5,000 Units by mouth 2 (two) times daily.     . clopidogrel (PLAVIX) 75 MG tablet Take 1 tablet (75 mg total) by mouth daily. 90 tablet 0  . Continuous Blood Gluc Receiver (FREESTYLE LIBRE 14 DAY READER) DEVI     . Continuous Blood Gluc Sensor (FREESTYLE LIBRE 14 DAY SENSOR) MISC     . Cyanocobalamin ([employer]S B-12) 1500 MCG TBDP Take 1,500 mcg by mouth daily.     . DULoxetine (CYMBALTA) 60 MG capsule Take 1 capsule (60 mg total) by mouth daily. 90 capsule 0  . empagliflozin (JARDIANCE) 25 MG TABS tablet Take 1 tablet (25 mg  total) by mouth daily before breakfast. 30 tablet 0  . furosemide (LASIX) 40 MG tablet TAKE ONE TABLET (40MG  TOTAL) BY MOUTH TWO TIMES DAILY. (Patient taking differently: Take 40 mg by mouth 2 (two) times daily. ) 180 tablet 3  . glimepiride (AMARYL) 4 MG tablet Take 1 tablet (4 mg total) by mouth daily with breakfast. 90 tablet 0  . levothyroxine (SYNTHROID) 75 MCG tablet Take 1 tablet (75 mcg total) by mouth daily before breakfast. 90 tablet 0  . linaclotide (LINZESS) 290 MCG CAPS capsule Take 290 mcg by mouth daily before breakfast.    . Multiple Vitamin (MULTIVITAMIN WITH MINERALS) TABS tablet Take 1 tablet by mouth daily.    . nitroGLYCERIN (NITROSTAT) 0.4 MG SL tablet Place 1 tablet (0.4 mg total) under the tongue every 5 (five) minutes as  needed for chest pain. 25 tablet 3  . pantoprazole (PROTONIX) 40 MG tablet TAKE ONE (1) TABLET BY MOUTH EVERY DAY 30 tablet 11  . potassium chloride SA (K-DUR,KLOR-CON) 20 MEQ tablet Take 1 tablet (20 mEq total) by mouth daily. Please schedule appointment for refills (Patient taking differently: Take 20 mEq by mouth every other day. Please schedule appointment for refills) 90 tablet 3  . pramipexole (MIRAPEX) 0.25 MG tablet Take 2 tablets (0.5 mg total) by mouth 2 (two) times daily. Take 0.5mg  by mouth at 7 PM and 0.5mg  at 9 PM daily. 360 tablet 3  . rosuvastatin (CRESTOR) 20 MG tablet Take 1 tablet (20 mg total) by mouth daily. 90 tablet 0   No current facility-administered medications on file prior to visit.     Allergies  Allergen Reactions  . Codeine Nausea And Vomiting  . Tramadol Nausea Only    Objective: [name] is a pleasant 74 y.o.  Caucasian male, in NAD. AAO x 3. There were no vitals filed for this visit.  Vascular Examination: Neurovascular status unchanged b/l lower extremities. Capillary refill time to digits immediate b/l. Palpable DP pulses b/l. Nonpalpable PT pulse(s) b/l lower extremities. Pedal hair absent b/l Skin temperature gradient within normal limits b/l.  Dermatological Examination: Pedal skin is thin shiny, atrophic bilaterally. No open wounds bilaterally. No interdigital macerations bilaterally. Toenails 2-5 bilaterally and R hallux elongated, discolored, dystrophic, thickened, and crumbly with subungual debris and tenderness to dorsal palpation. Hyperkeratotic lesion(s) distal stump L hallux.  No erythema, no edema, no drainage, no flocculence.   Wound Location: submet head 1 right foot   There is moderate amount of non-viable hyperkeratoti tissue present in the wound. Predebridement Wound Measurement: 2.0 x 2.0 x 0.2 cm Postdebridement Wound Measurement: 2.5 x 3.0 x 0.3 cm   Wound Base: Mixed fibrogranular Peri-wound: Calloused Exudate: None:  wound tissue dry Blood Loss during debridement: less than 1 cc.[name] Description of tissue removed from ulceration today: callous and necrotic tissue Signs of clinical bacterial infection are absent. Material in wound which inhibits healing/promotes adjacent tissue breakdown: Calloused periphery.  Musculoskeletal: Normal muscle strength 5/5 to all lower extremity muscle groups bilaterally. No pain crepitus or joint limitation noted with ROM b/l. No gross bony deformities bilaterally. Lower extremity amputation(s): digital amputation L hallux.  Neurological Examination: Protective sensation intact 5/5 intact bilaterally with 10g monofilament b/l. Vibratory sensation intact b/l.   Xray findings right foot, 3 views: No gas in tissues. No bone erosion noted at location of ulceration. Hardware remains in place across 1st MPJ.  Assessment: 1. Onychomycosis of multiple toenails with type 2 diabetes mellitus and peripheral neuropathy ([place])  2. Diabetic ulcer of right midfoot associated with diabetes mellitus due to underlying condition, with muscle involvement without evidence of necrosis ([place])   3. Status post amputation of left great toe ([place])   4. Diabetic peripheral neuropathy associated with type 2 diabetes mellitus ([place])   5. Encounter for diabetic foot exam ([place])    Plan: -Patient was evaluated and treated and all questions answered.  -Patient/POA/Family member educated on diagnosis and treatment plan of routine ulcer debridement/wound care.  -Ulceration debridement achieved utilizing sharp excisional debridement with sterile scalpel blade.. Type/amount of devitalized tissue removed: necrotic tissue. Wound cultured today. -Today's ulcer size post-debridement: 2.5 x 3.0 x 0.3 cm. -Ulceration cleansed with wound cleanser. Iodosorb Gel applied to base of ulceration and secured with light dressing. -Wound responded well to today's debridement. -Patient risk factors affecting healing of ulcer:  diabetes, diabetic neuropathy, PAD, history of prior amputation, foot deformity -Toenails 2-5 b/ and right halluxl were debrided in length and girth with sterile nail nippers and dremel without iatrogenic bleeding.  -Patient to report any pedal injuries to medical professional immediately. -Xray of right foot was performed and reviewed with patient and/or POA. -Patient to continue wearing surgical shoe for right foot daily.  -Patient/POA to call should there be question/concern in the interim.  Return in about 3 months (around 10/14/2020) to see me for at risk foot care.   Marzetta Board, DPM

## 2020-07-16 DIAGNOSIS — E113299 Type 2 diabetes mellitus with mild nonproliferative diabetic retinopathy without macular edema, unspecified eye: Secondary | ICD-10-CM | POA: Insufficient documentation

## 2020-07-18 ENCOUNTER — Telehealth: Payer: Self-pay | Admitting: Podiatry

## 2020-07-18 NOTE — Telephone Encounter (Signed)
Called patient to ask where he would like to go for wound care / where he went previously.  No answer. If patient is to call back we will figure out where he would like to go so we can process referral

## 2020-07-22 ENCOUNTER — Other Ambulatory Visit: Payer: Self-pay | Admitting: Cardiovascular Disease

## 2020-07-28 ENCOUNTER — Ambulatory Visit: Payer: PPO

## 2020-07-30 ENCOUNTER — Ambulatory Visit (INDEPENDENT_AMBULATORY_CARE_PROVIDER_SITE_OTHER): Payer: PPO

## 2020-07-30 ENCOUNTER — Other Ambulatory Visit: Payer: Self-pay

## 2020-07-30 DIAGNOSIS — E1159 Type 2 diabetes mellitus with other circulatory complications: Secondary | ICD-10-CM

## 2020-07-30 DIAGNOSIS — Z23 Encounter for immunization: Secondary | ICD-10-CM | POA: Diagnosis not present

## 2020-08-01 LAB — MICROALBUMIN, URINE: Microalbumin, Urine: 17.7 ug/mL

## 2020-08-08 ENCOUNTER — Telehealth: Payer: Self-pay | Admitting: Podiatry

## 2020-08-08 DIAGNOSIS — E08621 Diabetes mellitus due to underlying condition with foot ulcer: Secondary | ICD-10-CM

## 2020-08-08 DIAGNOSIS — L97415 Non-pressure chronic ulcer of right heel and midfoot with muscle involvement without evidence of necrosis: Secondary | ICD-10-CM

## 2020-08-08 NOTE — Telephone Encounter (Signed)
I have routed this to Dr. March Rummage and Allena Katz.

## 2020-08-08 NOTE — Telephone Encounter (Signed)
Pt called wanting to know if you could get him re-established as a patient at the wound center. Please advise.

## 2020-08-08 NOTE — Telephone Encounter (Signed)
Patient stated he would like to be reinstated for the wound center in [place], please advise

## 2020-08-11 ENCOUNTER — Ambulatory Visit (INDEPENDENT_AMBULATORY_CARE_PROVIDER_SITE_OTHER): Payer: PPO | Admitting: Internal Medicine

## 2020-08-11 ENCOUNTER — Encounter: Payer: Self-pay | Admitting: Internal Medicine

## 2020-08-11 ENCOUNTER — Other Ambulatory Visit: Payer: Self-pay

## 2020-08-11 VITALS — BP 130/69 | HR 77 | Temp 97.1°F | Ht 72.0 in | Wt 165.0 lb

## 2020-08-11 DIAGNOSIS — Y92009 Unspecified place in unspecified non-institutional (private) residence as the place of occurrence of the external cause: Secondary | ICD-10-CM | POA: Diagnosis not present

## 2020-08-11 DIAGNOSIS — E1159 Type 2 diabetes mellitus with other circulatory complications: Secondary | ICD-10-CM

## 2020-08-11 DIAGNOSIS — G4733 Obstructive sleep apnea (adult) (pediatric): Secondary | ICD-10-CM

## 2020-08-11 DIAGNOSIS — I10 Essential (primary) hypertension: Secondary | ICD-10-CM

## 2020-08-11 DIAGNOSIS — D539 Nutritional anemia, unspecified: Secondary | ICD-10-CM

## 2020-08-11 DIAGNOSIS — G2581 Restless legs syndrome: Secondary | ICD-10-CM | POA: Diagnosis not present

## 2020-08-11 DIAGNOSIS — W19XXXA Unspecified fall, initial encounter: Secondary | ICD-10-CM

## 2020-08-11 NOTE — Assessment & Plan Note (Addendum)
On Pramipexole F/u with Neurology Check Ferritin level and TSH before next visit

## 2020-08-11 NOTE — Patient Instructions (Addendum)
Please call your Neurologist to discuss about the tremors in the legs and arms.  Please use the cane/walker all the time to help you as walking support.  Please start using CPAP at nightime as soon as possible. It will help you with the daytime fatigue and sleepiness.  Please continue taking medications as prescribed.  Please get blood tests done before the next visit.

## 2020-08-11 NOTE — Assessment & Plan Note (Signed)
On Jardiance and Glimepiride Advised to follow diabetic diet On statin F/u CMP and lipid panel before next visit Diabetic eye exam: Advised to follow up with Ophthalmology for diabetic eye exam

## 2020-08-11 NOTE — Assessment & Plan Note (Signed)
BP Readings from Last 1 Encounters:  08/11/20 130/69   Well-controlled with Coreg Counseled for compliance with the medications Advised DASH diet and moderate exercise/walking, at least 150 mins/week

## 2020-08-11 NOTE — Assessment & Plan Note (Addendum)
Unclear etiology Possible etiologies could be daytime somnolence (due to OSA and not using CPAP), hypoglycemia due to skipping meals, dehydration or physical deconditioning No visible injuries Advised to use walking support Avoid sudden positional changes Will check need for Lasix and Glimepiride based on next CMP and HbA1C F/u with Neurology

## 2020-08-11 NOTE — Progress Notes (Signed)
Established Patient Office Visit  Subjective:  Patient ID: [name], male    DOB: 1945-10-21  Age: 74 y.o. MRN: [id number]  CC:  Chief Complaint  Patient presents with  . Leg Pain    Has RLS, this has been ongoing for awhile  . Fatigue    goes to sleep very easily, especially when sitting still    HPI [name] is a 74 year old male with PMH of HTN, OSA, PAD, type II DM, restless leg syndrome, hyperlipidemia and chronic constipation presents for follow up of his chronic medical conditions.  He complains of daytime somnolence and fatigue and states that he falls asleep while sitting at home.  Of note, patient has not been using his CPAP for his OSA.  He also reports multiple falls in the last month.  He uses cane as a walking support.  He denies any dizziness, chest pain, palpitations or dyspnea before the episodes of falls.  He is not able to describe the nature of the fall.  But he denies any seizure-like activity.  He also complains of shaking movements in the legs and in the arms, and states that he is restless leg syndrome is not been well controlled with pramipexole.  He follows up with neurologist for RLS.  Patient is advised to follow-up with neurologist sooner.  Past Medical History:  Diagnosis Date  . Aftercare following surgery of the circulatory system, [place] 12/04/2013  . Angina decubitus ([place]) 05/19/2016  . Arrhythmia 05/20/2016  . Arthritis   . ARTHRITIS, RIGHT FOOT 06/26/2008   Qualifier: Diagnosis of  By: Aline Brochure MD, Dorothyann Peng    . Cardiomyopathy- EF NL 01/2013, now 30-35% echo 03/14/2012  . Carotid artery occlusion    left s/p CEA  . Cellulitis of left foot 05/07/2018  . CHF (congestive heart failure) ([place])   . Critical lower limb ischemia (HCC) 02/28/2014   Critical limb ischemia   . Diabetes mellitus   . Elevated troponin 04/11/2016  . GERD (gastroesophageal reflux disease)   . History of stroke June 2013 03/14/2012  . HOH (hard of hearing)   .  Hypertension   . Hypothyroidism   . Nonhealing skin ulcer ([place]) 10/28/2014  . Obstructive sleep apnea   . Orthostatic hypotension   . Osteomyelitis ([place])    left great toe  . Pain in the chest 05/17/2016  . Peripheral arterial disease ([place]), s/p PTA x 2 RLE    nonhealing ulcers bilaterally on each great toe  . Pneumonia   . PONV (postoperative nausea and vomiting)   . Pulmonary nodule 05/17/2016  . Restless leg syndrome   . Shortness of breath   . Stroke [facility]) March 08, 2012  . Toe osteomyelitis, left ([place])   . Ulcer of great toe, left, with necrosis of bone ([place])   . Wears dentures     Past Surgical History:  Procedure Laterality Date  . AMPUTATION TOE Left 07/25/2018   Procedure: AMPUTATION TOE INTERPHALANGEAL HALLUX LEFT;  Surgeon: Evelina Bucy, DPM;  Location: [place];  Service: Podiatry;  Laterality: Left;  . ANGIOPLASTY  02/28/14   diamond back orbital rotational atherectomy of Rt. tibial  . BACK SURGERY    . BONE BIOPSY Left 07/25/2018   Procedure: SUPERFICIAL BONE BIOPSY;  Surgeon: Evelina Bucy, DPM;  Location: [place];  Service: Podiatry;  Laterality: Left;  . CARDIAC CATHETERIZATION N/A 05/19/2016   Procedure: Right/Left Heart Cath and Coronary Angiography;  Surgeon: Belva Crome, MD;  Location[facility]  INVASIVE CV LAB;  Service: Cardiovascular;  Laterality: N/A;  . CERVICAL FUSION    . ENDARTERECTOMY Left 11/29/2013   Procedure: ENDARTERECTOMY CAROTID;  Surgeon: Serafina Mitchell, MD;  Location: [facility] OR;  Service: Vascular;  Laterality: Left;  . ESOPHAGOGASTRODUODENOSCOPY  02/2010   Dr. Gala Romney: patient presented with food impaction, schatzki ring with superimposed component of stricture with erosive reflux esophagitis, s/p disimpaction but dilation planned at later date   . ESOPHAGOGASTRODUODENOSCOPY (EGD) WITH PROPOFOL N/A 08/13/2019   Dr. Gala Romney: Erosive reflux esophagitis with mild stricture and incidental Mallory-Weiss tear which precluded esophageal dilation.  Medium sized  hiatal hernia.  [name] ESOPHAGOGASTRODUODENOSCOPY (EGD) WITH PROPOFOL N/A 10/11/2019   Dr. Gala Romney: Esophageal stenosis status post dilation, moderate hiatal hernia  . EYE SURGERY    . FLEXIBLE SIGMOIDOSCOPY N/A 08/13/2019   Procedure: FLEXIBLE SIGMOIDOSCOPY;  Surgeon: [name], MD;  [facility];  Service: Endoscopy;  Laterality: N/A;  colonoscopy aboerted due to formed stool and poor prep  . FOOT SURGERY    . KNEE ARTHROSCOPY WITH MEDIAL MENISECTOMY Left 05/06/2020   Procedure: KNEE ARTHROSCOPY WITH MEDIAL MENISCECTOMY AND LATERAL MENISCECTOMY;  Surgeon: [name], MD;  Location: [facility];  Service: Orthopedics;  Laterality: Left;  . Lower ext duplex doppler  03/14/14   Rt ABI 1.2  . LOWER EXTREMITY ANGIOGRAM Bilateral 02/18/2014   Procedure: LOWER EXTREMITY ANGIOGRAM;  Surgeon: [name], MD;  Location: [facility] CATH LAB;  Service: Cardiovascular;  Laterality: Bilateral;  . LOWER EXTREMITY ANGIOGRAM N/A 10/31/2014   Procedure: LOWER EXTREMITY ANGIOGRAM;  Surgeon: [name], MD;  Location: [facility] CATH LAB;  Service: Cardiovascular;  Laterality: N/A;  . Venia Minks DILATION N/A 10/11/2019   Procedure: Venia Minks DILATION;  Surgeon: [name], MD;  [facility];  Service: Endoscopy;  Laterality: N/A;  . MULTIPLE TOOTH EXTRACTIONS    . PV angiogram  02/18/2014   tibial vessel diseas bil.  [name] SPINE SURGERY    . tendon achillies lengthing and sesamoid      Family History  Problem Relation Age of Onset  . Heart disease Mother   . Hypertension Mother   . Heart attack Mother   . Hypertension Father   . Diabetes Father   . Diabetes Son   . Heart disease Son   . Hypertension Son   . Colon cancer Neg Hx     Social History   Socioeconomic History  . Marital status: Married    Spouse name: [name]   . Number of children: 4  . Years of education: college  . Highest education level: Not on file  Occupational History  . Occupation: Firefighter   Tobacco Use    . Smoking status: Former Smoker    Years: 1.00    Types: Pipe    Quit date: 07/08/1977    Years since quitting: 43.1  . Smokeless tobacco: Never Used  Vaping Use  . Vaping Use: Never used  Substance and Sexual Activity  . Alcohol use: No    Alcohol/week: 0.0 standard drinks  . Drug use: No  . Sexual activity: Not on file  Other Topics Concern  . Not on file  Social History Narrative   Retired from medical -[employer]      Lives with [name]    Cat: Jazz      Enjoy: sleeping a lot       Diet: eats all food groups -chicken, steak-chopped, mostly veggie   Caffeine: coffee and soda "  a lot"   Water: 3-4 cups daily      Wears seat belt   Does not use phone while driving    Smoke detectors at home    weapons at home        Social Determinants of Health   Financial Resource Strain: Low Risk   . Difficulty of Paying Living Expenses: Not hard at all  Food Insecurity: No Food Insecurity  . Worried About Charity fundraiser in the Last Year: Never true  . Ran Out of Food in the Last Year: Never true  Transportation Needs: No Transportation Needs  . Lack of Transportation (Medical): No  . Lack of Transportation (Non-Medical): No  Physical Activity: Inactive  . Days of Exercise per Week: 0 days  . Minutes of Exercise per Session: 0 min  Stress: No Stress Concern Present  . Feeling of Stress : Only a little  Social Connections: Moderately Isolated  . Frequency of Communication with Friends and Family: Once a week  . Frequency of Social Gatherings with Friends and Family: Once a week  . Attends Religious Services: More than 4 times per year  . Active Member of Clubs or Organizations: No  . Attends Archivist Meetings: Never  . Marital Status: Married  Human resources officer Violence: Not At Risk  . Fear of Current or Ex-Partner: No  . Emotionally Abused: No  . Physically Abused: No  . Sexually Abused: No    Outpatient Medications Prior to Visit  Medication  Sig Dispense Refill  . aspirin EC 81 MG EC tablet Take 1 tablet (81 mg total) by mouth daily. (Patient taking differently: Take 81 mg by mouth at bedtime. )    . b complex vitamins tablet Take 1 tablet by mouth daily.    . carvedilol (COREG) 3.125 MG tablet Take 1 tablet (3.125 mg total) by mouth 2 (two) times daily with a meal. 180 tablet 3  . Cholecalciferol (VITAMIN D3) 5000 units TABS Take 5,000 Units by mouth 2 (two) times daily.     . clopidogrel (PLAVIX) 75 MG tablet Take 1 tablet (75 mg total) by mouth daily. 90 tablet 0  . Continuous Blood Gluc Receiver (FREESTYLE LIBRE 14 DAY READER) DEVI     . Continuous Blood Gluc Sensor (FREESTYLE LIBRE 14 DAY SENSOR) MISC     . Cyanocobalamin ([employer] B-12) 1500 MCG TBDP Take 1,500 mcg by mouth daily.     . DULoxetine (CYMBALTA) 60 MG capsule Take 1 capsule (60 mg total) by mouth daily. 90 capsule 0  . empagliflozin (JARDIANCE) 25 MG TABS tablet Take 1 tablet (25 mg total) by mouth daily before breakfast. 30 tablet 0  . furosemide (LASIX) 40 MG tablet TAKE ONE TABLET ($RemoveBef'40MG'AzFOKoFAtv$  TOTAL) BY MOUTH TWO TIMES DAILY. 180 tablet 3  . glimepiride (AMARYL) 4 MG tablet Take 1 tablet (4 mg total) by mouth daily with breakfast. 90 tablet 0  . levothyroxine (SYNTHROID) 75 MCG tablet Take 1 tablet (75 mcg total) by mouth daily before breakfast. 90 tablet 0  . linaclotide (LINZESS) 290 MCG CAPS capsule Take 290 mcg by mouth daily before breakfast.    . Multiple Vitamin (MULTIVITAMIN WITH MINERALS) TABS tablet Take 1 tablet by mouth daily.    . nitroGLYCERIN (NITROSTAT) 0.4 MG SL tablet Place 1 tablet (0.4 mg total) under the tongue every 5 (five) minutes as needed for chest pain. 25 tablet 3  . pantoprazole (PROTONIX) 40 MG tablet TAKE ONE (1) TABLET BY MOUTH EVERY DAY  30 tablet 11  . potassium chloride SA (K-DUR,KLOR-CON) 20 MEQ tablet Take 1 tablet (20 mEq total) by mouth daily. Please schedule appointment for refills (Patient taking differently: Take 20 mEq by mouth every  other day. Please schedule appointment for refills) 90 tablet 3  . pramipexole (MIRAPEX) 0.25 MG tablet Take 2 tablets (0.5 mg total) by mouth 2 (two) times daily. Take 0.$RemoveBefor'5mg'KjlDJjSHTZBa$  by mouth at 7 PM and 0.$Remove'5mg'DlGvKDs$  at 9 PM daily. 360 tablet 3  . rosuvastatin (CRESTOR) 20 MG tablet Take 1 tablet (20 mg total) by mouth daily. 90 tablet 0   No facility-administered medications prior to visit.    Allergies  Allergen Reactions  . Codeine Nausea And Vomiting  . Tramadol Nausea Only    ROS Review of Systems  Constitutional: Negative for chills and fever.  HENT: Negative for congestion and sore throat.   Eyes: Negative for pain and discharge.  Respiratory: Negative for cough and shortness of breath.   Cardiovascular: Negative for chest pain and palpitations.  Gastrointestinal: Negative for constipation, diarrhea, nausea and vomiting.  Endocrine: Negative for polydipsia and polyuria.  Genitourinary: Negative for dysuria and hematuria.  Musculoskeletal: Negative for neck pain and neck stiffness.  Skin: Negative for rash.  Neurological: Positive for tremors and weakness (Generalized). Negative for dizziness, numbness and headaches.  Psychiatric/Behavioral: Negative for agitation and behavioral problems.       Objective:    Physical Exam Vitals reviewed.  Constitutional:      General: He is not in acute distress.    Appearance: He is not diaphoretic.  HENT:     Head: Normocephalic and atraumatic.     Nose: Nose normal.     Mouth/Throat:     Mouth: Mucous membranes are moist.  Eyes:     General: No scleral icterus.    Extraocular Movements: Extraocular movements intact.     Pupils: Pupils are equal, round, and reactive to light.  Cardiovascular:     Rate and Rhythm: Normal rate and regular rhythm.     Pulses: Normal pulses.     Heart sounds: Normal heart sounds. No murmur heard.   Pulmonary:     Breath sounds: Normal breath sounds. No wheezing or rales.  Abdominal:     Palpations: Abdomen  is soft.     Tenderness: There is no abdominal tenderness.  Musculoskeletal:     Cervical back: Neck supple. No tenderness.     Right lower leg: No edema.     Left lower leg: No edema.  Skin:    General: Skin is warm.     Findings: No rash.  Neurological:     General: No focal deficit present.     Mental Status: He is alert and oriented to person, place, and time.     Cranial Nerves: No cranial nerve deficit.     Sensory: No sensory deficit.  Psychiatric:        Mood and Affect: Mood normal.        Behavior: Behavior normal.     BP 130/69 (BP Location: Right Arm, Patient Position: Sitting, Cuff Size: Normal)   Pulse 77   Temp (!) 97.1 F (36.2 C) (Temporal)   Ht 6' (1.829 m)   Wt 165 lb (74.8 kg)   SpO2 96%   BMI 22.38 kg/m  Wt Readings from Last 3 Encounters:  08/11/20 165 lb (74.8 kg)  07/11/20 207 lb (93.9 kg)  07/10/20 207 lb (93.9 kg)     Health Maintenance Due  Topic Date Due  . OPHTHALMOLOGY EXAM  05/30/2019    There are no preventive care reminders to display for this patient.  Lab Results  Component Value Date   TSH 4.054 02/12/2014   Lab Results  Component Value Date   WBC 6.4 05/01/2020   HGB 11.9 (L) 05/06/2020   HCT 35.0 (L) 05/06/2020   MCV 99.7 05/01/2020   PLT 232 05/01/2020   Lab Results  Component Value Date   NA 141 05/06/2020   K 3.4 (L) 05/06/2020   CO2 29 05/01/2020   GLUCOSE 168 (H) 05/06/2020   BUN 12 05/06/2020   CREATININE 0.90 05/06/2020   BILITOT 0.8 05/08/2018   ALKPHOS 53 05/08/2018   AST 17 05/08/2018   ALT 16 05/08/2018   PROT 6.4 (L) 05/08/2018   ALBUMIN 3.1 (L) 05/08/2018   CALCIUM 9.1 05/01/2020   ANIONGAP 13 05/01/2020   Lab Results  Component Value Date   CHOL 162 11/26/2014   Lab Results  Component Value Date   HDL 42 11/26/2014   Lab Results  Component Value Date   LDLCALC 95 11/26/2014   Lab Results  Component Value Date   TRIG 127 11/26/2014   Lab Results  Component Value Date   CHOLHDL  3.9 11/26/2014   Lab Results  Component Value Date   HGBA1C 8.6 (A) 06/03/2020   HGBA1C 8.6 06/03/2020   HGBA1C 8.6 (A) 06/03/2020   HGBA1C 8.6 (A) 06/03/2020      Assessment & Plan:   Problem List Items Addressed This Visit      Cardiovascular and Mediastinum   Hypertension - Primary (Chronic)    BP Readings from Last 1 Encounters:  08/11/20 130/69   Well-controlled with Coreg Counseled for compliance with the medications Advised DASH diet and moderate exercise/walking, at least 150 mins/week       Relevant Orders   CBC with Differential     Respiratory   Obstructive sleep apnea    Not using CPAP currently Noncompliance leading to daytime fatigue and somnolence, could be a possible etiology for falls Advised to use it regularly F/u with Neurology        Endocrine   Type 2 diabetes mellitus with circulatory disorder ([place])    On Jardiance and Glimepiride Advised to follow diabetic diet On statin F/u CMP and lipid panel before next visit Diabetic eye exam: Advised to follow up with Ophthalmology for diabetic eye exam       Relevant Orders   CMP14+EGFR   HgB A1c   Lipid Profile     Other   Restless leg syndrome (Chronic)    On Pramipexole F/u with Neurology Check Ferritin level and TSH before next visit      Relevant Orders   TSH + free T4   Ferritin   Fall at home, initial encounter    Unclear etiology Possible etiologies could be daytime somnolence (due to OSA and not using CPAP), hypoglycemia due to skipping meals, dehydration or physical deconditioning No visible injuries Advised to use walking support Avoid sudden positional changes Will check need for Lasix and Glimepiride based on next CMP and HbA1C F/u with Neurology         No orders of the defined types were placed in this encounter.   Follow-up: Return in about 3 months (around 11/11/2020).    Lindell Spar, MD

## 2020-08-11 NOTE — Assessment & Plan Note (Signed)
Not using CPAP currently Noncompliance leading to daytime fatigue and somnolence, could be a possible etiology for falls Advised to use it regularly F/u with Neurology

## 2020-08-22 ENCOUNTER — Telehealth: Payer: Self-pay

## 2020-08-22 NOTE — Telephone Encounter (Signed)
Pt did not answer, left voicemail stating to call [facility] with information on healthcare facilities he was using.

## 2020-08-25 NOTE — Addendum Note (Signed)
Addended by: Hardie Pulley on: 08/25/2020 02:40 PM   Modules accepted: Orders

## 2020-08-26 ENCOUNTER — Encounter (HOSPITAL_COMMUNITY): Payer: PPO

## 2020-08-26 ENCOUNTER — Ambulatory Visit (HOSPITAL_COMMUNITY)
Admission: RE | Admit: 2020-08-26 | Discharge: 2020-08-26 | Disposition: A | Discharge status: Home / Self Care | Payer: PPO | Source: Ambulatory Visit | Attending: Cardiology | Admitting: Cardiology

## 2020-08-26 ENCOUNTER — Other Ambulatory Visit: Payer: Self-pay

## 2020-08-26 ENCOUNTER — Other Ambulatory Visit: Payer: Self-pay | Admitting: Cardiovascular Disease

## 2020-08-26 DIAGNOSIS — I739 Peripheral vascular disease, unspecified: Secondary | ICD-10-CM | POA: Diagnosis not present

## 2020-08-26 NOTE — Telephone Encounter (Signed)
Referral faxed/reb 

## 2020-08-28 ENCOUNTER — Ambulatory Visit: Payer: PPO | Admitting: Orthotics

## 2020-08-29 ENCOUNTER — Other Ambulatory Visit: Payer: Self-pay | Admitting: Family Medicine

## 2020-09-03 ENCOUNTER — Other Ambulatory Visit: Payer: Self-pay | Admitting: Family Medicine

## 2020-09-04 ENCOUNTER — Telehealth: Payer: Self-pay

## 2020-09-04 ENCOUNTER — Ambulatory Visit: Payer: PPO | Admitting: Orthotics

## 2020-09-04 ENCOUNTER — Other Ambulatory Visit: Payer: Self-pay

## 2020-09-04 DIAGNOSIS — L97415 Non-pressure chronic ulcer of right heel and midfoot with muscle involvement without evidence of necrosis: Secondary | ICD-10-CM

## 2020-09-04 DIAGNOSIS — E08621 Diabetes mellitus due to underlying condition with foot ulcer: Secondary | ICD-10-CM

## 2020-09-04 DIAGNOSIS — E1159 Type 2 diabetes mellitus with other circulatory complications: Secondary | ICD-10-CM

## 2020-09-04 NOTE — Progress Notes (Signed)
Being seen by NP, will call when schd w doc

## 2020-09-04 NOTE — Telephone Encounter (Signed)
Rick w/ [facility] called on behalf of pt. He is requesting a pair of diabetic shoes. Given he is Medicare, he will need the supervising physician to co sign for these. They would like a call back to his assistant Dawn regarding this. [phone]

## 2020-09-04 NOTE — Telephone Encounter (Signed)
I am happy to do that. If they need Dr Moshe Cipro to do it she will help out as well.

## 2020-09-09 ENCOUNTER — Other Ambulatory Visit: Payer: Self-pay

## 2020-09-09 MED ORDER — ROSUVASTATIN CALCIUM 20 MG PO TABS: 20.0000 mg | ORAL_TABLET | Freq: Every day | ORAL | 1 refills | Status: DC

## 2020-09-09 MED ORDER — DULOXETINE HCL 60 MG PO CPEP: 60.0000 mg | ORAL_CAPSULE | Freq: Every day | ORAL | 1 refills | Status: DC

## 2020-09-09 MED ORDER — GLIMEPIRIDE 4 MG PO TABS: 4.0000 mg | ORAL_TABLET | Freq: Every day | ORAL | 1 refills | Status: DC

## 2020-09-09 MED ORDER — EMPAGLIFLOZIN 25 MG PO TABS: 25.0000 mg | ORAL_TABLET | Freq: Every day | ORAL | 1 refills | Status: DC

## 2020-09-09 MED ORDER — LEVOTHYROXINE SODIUM 75 MCG PO TABS: 75.0000 ug | ORAL_TABLET | Freq: Every day | ORAL | 1 refills | Status: DC

## 2020-09-23 ENCOUNTER — Ambulatory Visit (HOSPITAL_COMMUNITY)
Admission: RE | Admit: 2020-09-23 | Discharge: 2020-09-23 | Disposition: A | Discharge status: Home / Self Care | Payer: PPO | Source: Ambulatory Visit | Attending: Cardiovascular Disease | Admitting: Cardiovascular Disease

## 2020-09-23 ENCOUNTER — Other Ambulatory Visit: Payer: Self-pay

## 2020-09-23 ENCOUNTER — Other Ambulatory Visit (HOSPITAL_COMMUNITY): Payer: Self-pay | Admitting: Cardiovascular Disease

## 2020-09-23 ENCOUNTER — Telehealth: Payer: Self-pay | Admitting: Pharmacist Clinician (PhC)/ Clinical Pharmacy Specialist

## 2020-09-23 DIAGNOSIS — Z9889 Other specified postprocedural states: Secondary | ICD-10-CM

## 2020-09-23 DIAGNOSIS — I6523 Occlusion and stenosis of bilateral carotid arteries: Secondary | ICD-10-CM | POA: Diagnosis not present

## 2020-09-23 MED ORDER — POTASSIUM CHLORIDE ER 10 MEQ PO TBCR
10.0000 meq | EXTENDED_RELEASE_TABLET | Freq: Every day | ORAL | 0 refills | Status: DC
Start: 2020-09-23 — End: 2021-02-17

## 2020-09-23 NOTE — Telephone Encounter (Signed)
He will need to repeat labs and follow up with Dr. Allyson Sabal in the next month

## 2020-09-23 NOTE — Telephone Encounter (Signed)
Patient in office today for vascular testing.  Had question about his potassium prescription.  States takes qod right now, but has difficulty swallowing tablets.  Would like to know if can have lower strength and take daily instead.    Assured patient that we can do 10 mEq tablets and have him take 1 daily for now

## 2020-09-25 DIAGNOSIS — E1159 Type 2 diabetes mellitus with other circulatory complications: Secondary | ICD-10-CM | POA: Diagnosis not present

## 2020-09-25 DIAGNOSIS — D539 Nutritional anemia, unspecified: Secondary | ICD-10-CM | POA: Diagnosis not present

## 2020-09-25 DIAGNOSIS — I10 Essential (primary) hypertension: Secondary | ICD-10-CM | POA: Diagnosis not present

## 2020-09-25 NOTE — Addendum Note (Signed)
Addended by: Abner Greenspan on: 09/25/2020 08:57 AM   Modules accepted: Orders

## 2020-09-26 LAB — CMP14+EGFR
ALT: 12 IU/L (ref 0–44)
AST: 13 IU/L (ref 0–40)
Albumin/Globulin Ratio: 1.6 (ref 1.2–2.2)
Albumin: 4.3 g/dL (ref 3.7–4.7)
Alkaline Phosphatase: 87 IU/L (ref 44–121)
BUN/Creatinine Ratio: 14 (ref 10–24)
BUN: 14 mg/dL (ref 8–27)
Bilirubin Total: 0.5 mg/dL (ref 0.0–1.2)
CO2: 25 mmol/L (ref 20–29)
Calcium: 8.9 mg/dL (ref 8.6–10.2)
Chloride: 96 mmol/L (ref 96–106)
Creatinine, Ser: 1 mg/dL (ref 0.76–1.27)
GFR calc Af Amer: 84 mL/min/{1.73_m2} (ref >59)
GFR calc non Af Amer: 73 mL/min/{1.73_m2} (ref >59)
Globulin, Total: 2.7 g/dL (ref 1.5–4.5)
Glucose: 147 mg/dL — ABNORMAL HIGH (ref 65–99)
Potassium: 3.3 mmol/L — ABNORMAL LOW (ref 3.5–5.2)
Sodium: 139 mmol/L (ref 134–144)
Total Protein: 7 g/dL (ref 6.0–8.5)

## 2020-09-26 LAB — CBC
Hematocrit: 33.2 % — ABNORMAL LOW (ref 37.5–51.0)
Hemoglobin: 11.3 g/dL — ABNORMAL LOW (ref 13.0–17.7)
MCH: 34 pg — ABNORMAL HIGH (ref 26.6–33.0)
MCHC: 34 g/dL (ref 31.5–35.7)
MCV: 100 fL — ABNORMAL HIGH (ref 79–97)
Platelets: 207 10*3/uL (ref 150–450)
RBC: 3.32 x10E6/uL — ABNORMAL LOW (ref 4.14–5.80)
RDW: 14 % (ref 11.6–15.4)
WBC: 5.1 10*3/uL (ref 3.4–10.8)

## 2020-09-26 LAB — LIPID PANEL
Chol/HDL Ratio: 2.8 ratio (ref 0.0–5.0)
Cholesterol, Total: 141 mg/dL (ref 100–199)
HDL: 50 mg/dL (ref >39)
LDL Chol Calc (NIH): 75 mg/dL (ref 0–99)
Triglycerides: 86 mg/dL (ref 0–149)
VLDL Cholesterol Cal: 16 mg/dL (ref 5–40)

## 2020-09-26 LAB — HEMOGLOBIN A1C
Est. average glucose Bld gHb Est-mCnc: 177 mg/dL
Hgb A1c MFr Bld: 7.8 % — ABNORMAL HIGH (ref 4.8–5.6)

## 2020-09-26 LAB — TSH+FREE T4
Free T4: 1.5 ng/dL (ref 0.82–1.77)
TSH: 4.41 u[IU]/mL (ref 0.450–4.500)

## 2020-09-26 LAB — FERRITIN: Ferritin: 152 ng/mL (ref 30–400)

## 2020-09-30 ENCOUNTER — Telehealth (INDEPENDENT_AMBULATORY_CARE_PROVIDER_SITE_OTHER): Payer: HMO | Admitting: Family Medicine

## 2020-09-30 ENCOUNTER — Encounter: Payer: Self-pay | Admitting: Family Medicine

## 2020-09-30 ENCOUNTER — Other Ambulatory Visit: Payer: Self-pay

## 2020-09-30 VITALS — Ht 69.0 in | Wt 213.0 lb

## 2020-09-30 DIAGNOSIS — E08621 Diabetes mellitus due to underlying condition with foot ulcer: Secondary | ICD-10-CM | POA: Diagnosis not present

## 2020-09-30 DIAGNOSIS — G4733 Obstructive sleep apnea (adult) (pediatric): Secondary | ICD-10-CM | POA: Diagnosis not present

## 2020-09-30 DIAGNOSIS — L97411 Non-pressure chronic ulcer of right heel and midfoot limited to breakdown of skin: Secondary | ICD-10-CM | POA: Diagnosis not present

## 2020-09-30 DIAGNOSIS — R269 Unspecified abnormalities of gait and mobility: Secondary | ICD-10-CM

## 2020-09-30 MED ORDER — UNABLE TO FIND: 0 refills | Status: AC

## 2020-09-30 NOTE — Assessment & Plan Note (Signed)
Referral for wound care made

## 2020-09-30 NOTE — Assessment & Plan Note (Signed)
High fall risk, PT ordered to help gait mobility and balance improvement

## 2020-09-30 NOTE — Assessment & Plan Note (Addendum)
Needs supplies, these have been ordered today

## 2020-09-30 NOTE — Patient Instructions (Addendum)
I appreciate the opportunity to provide you with care for your health and wellness.  Follow up: July   No labs  Referrals today: PT and Diabetic foot ulcer care, and cpap supplies  Great to talk with you Mr Crescenzo, stay safe.  Please continue to practice social distancing to keep you, your family, and our community safe.  If you must go out, please wear a mask and practice good handwashing.  It was a pleasure to see you and I look forward to continuing to work together on your health and well-being. Please do not hesitate to call the office if you need care or have questions about your care.  Have a wonderful day. With Gratitude, Cherly Beach, DNP, AGNP-BC

## 2020-09-30 NOTE — Progress Notes (Signed)
Virtual Visit via Telephone Note   This visit type was conducted due to national recommendations for restrictions regarding the COVID-19 Pandemic (e.g. social distancing) in an effort to limit this patient's exposure and mitigate transmission in our community.  Due to his co-morbid illnesses, this patient is at least at moderate risk for complications without adequate follow up.  This format is felt to be most appropriate for this patient at this time.  The patient did not have access to video technology/had technical difficulties with video requiring transitioning to audio format only (telephone).  All issues noted in this document were discussed and addressed.  No physical exam could be performed with this format.    Evaluation Performed:  Follow-up visit  Date:  09/30/2020   ID:  [name], DOB 1945/10/03, MRN [id number]  Patient Location: Home Provider Location: Office/Clinic   Participants: Nurse intake and work up; Patient and Provider for Visit and Wrap up  Method of visit: Telephone  Location of Patient: Home Location of Provider: Office Consent was obtain for visit over the telephone. Services rendered by provider: Visit was performed via telephone  I verified that I am speaking with the correct person using two identifiers.  PCP:  Perlie Mayo, NP   Chief Complaint: Diabetes  History of Present Illness:    [name] is a 75 y.o. male with history as stated below. Reports medications as directed without any issues.  Denies having any hypoglycemia.  Denies having any wounds outside of his diabetic foot wound he needs to get back into the wound care clinic for.  Denies having presentation of urinary tract infection.  Denies having any recent falls.  Acute questions today per him it is a creatinine of the [employer], cannot yet see Pap supplies ordered, referral back to physical therapy for balance and mobility, and diabetic foot care.  Denies have any chest  pain, cough, shortness of breath, excessive leg swelling, palpitations.   The patient does not have symptoms concerning for COVID-19 infection (fever, chills, cough, or new shortness of breath).   Past Medical, Surgical, Social History, Allergies, and Medications have been Reviewed.  Past Medical History:  Diagnosis Date  . Aftercare following surgery of the circulatory system, [place] 12/04/2013  . Angina decubitus ([place]) 05/19/2016  . Arrhythmia 05/20/2016  . Arthritis   . ARTHRITIS, RIGHT FOOT 06/26/2008   Qualifier: Diagnosis of  By: Aline Brochure MD, Dorothyann Peng    . Cardiomyopathy- EF NL 01/2013, now 30-35% echo 03/14/2012  . Carotid artery occlusion    left s/p CEA  . Cellulitis of left foot 05/07/2018  . CHF (congestive heart failure) ([place])   . Critical lower limb ischemia (HCC) 02/28/2014   Critical limb ischemia   . Diabetes mellitus   . Elevated troponin 04/11/2016  . GERD (gastroesophageal reflux disease)   . History of stroke June 2013 03/14/2012  . HOH (hard of hearing)   . Hypertension   . Hypothyroidism   . Nonhealing skin ulcer ([place]) 10/28/2014  . Obstructive sleep apnea   . Orthostatic hypotension   . Osteomyelitis ([place])    left great toe  . Pain in the chest 05/17/2016  . Peripheral arterial disease ([place]), s/p PTA x 2 RLE    nonhealing ulcers bilaterally on each great toe  . Pneumonia   . PONV (postoperative nausea and vomiting)   . Pulmonary nodule 05/17/2016  . Restless leg syndrome   . Shortness of breath   . Stroke [facility])  March 08, 2012  . Toe osteomyelitis, left ([place])   . Ulcer of great toe, left, with necrosis of bone ([place])   . Wears dentures    Past Surgical History:  Procedure Laterality Date  . AMPUTATION TOE Left 07/25/2018   Procedure: AMPUTATION TOE INTERPHALANGEAL HALLUX LEFT;  Surgeon: Evelina Bucy, DPM;  Location: [place];  Service: Podiatry;  Laterality: Left;  . ANGIOPLASTY  02/28/14   diamond back orbital rotational atherectomy of Rt. tibial  . BACK SURGERY     . BONE BIOPSY Left 07/25/2018   Procedure: SUPERFICIAL BONE BIOPSY;  Surgeon: Evelina Bucy, DPM;  Location: [place];  Service: Podiatry;  Laterality: Left;  . CARDIAC CATHETERIZATION N/A 05/19/2016   Procedure: Right/Left Heart Cath and Coronary Angiography;  Surgeon: Belva Crome, MD;  Location: [place] CV LAB;  Service: Cardiovascular;  Laterality: N/A;  . CERVICAL FUSION    . ENDARTERECTOMY Left 11/29/2013   Procedure: ENDARTERECTOMY CAROTID;  Surgeon: Serafina Mitchell, MD;  Location: [facility] OR;  Service: Vascular;  Laterality: Left;  . ESOPHAGOGASTRODUODENOSCOPY  02/2010   Dr. Gala Romney: patient presented with food impaction, schatzki ring with superimposed component of stricture with erosive reflux esophagitis, s/p disimpaction but dilation planned at later date   . ESOPHAGOGASTRODUODENOSCOPY (EGD) WITH PROPOFOL N/A 08/13/2019   Dr. Gala Romney: Erosive reflux esophagitis with mild stricture and incidental Mallory-Weiss tear which precluded esophageal dilation.  Medium sized hiatal hernia.  [name] ESOPHAGOGASTRODUODENOSCOPY (EGD) WITH PROPOFOL N/A 10/11/2019   Dr. Gala Romney: Esophageal stenosis status post dilation, moderate hiatal hernia  . EYE SURGERY    . FLEXIBLE SIGMOIDOSCOPY N/A 08/13/2019   Procedure: FLEXIBLE SIGMOIDOSCOPY;  Surgeon: [name], MD;  [facility];  Service: Endoscopy;  Laterality: N/A;  colonoscopy aboerted due to formed stool and poor prep  . FOOT SURGERY    . KNEE ARTHROSCOPY WITH MEDIAL MENISECTOMY Left 05/06/2020   Procedure: KNEE ARTHROSCOPY WITH MEDIAL MENISCECTOMY AND LATERAL MENISCECTOMY;  Surgeon: [name], MD;  Location: [facility] OR[facility];  Service: Orthopedics;  Laterality: Left;  . Lower ext duplex doppler  03/14/14   Rt ABI 1.2  . LOWER EXTREMITY ANGIOGRAM Bilateral 02/18/2014   Procedure: LOWER EXTREMITY ANGIOGRAM;  Surgeon: [name], MD;  Location[facility] CATH LAB;  Service: Cardiovascular;  Laterality: Bilateral;  . LOWER EXTREMITY ANGIOGRAM N/A  10/31/2014   Procedure: LOWER EXTREMITY ANGIOGRAM;  Surgeon: [name], MD;  Location: [facility] CATH LAB;  Service: Cardiovascular;  Laterality: N/A;  . Venia Minks DILATION N/A 10/11/2019   Procedure: Venia Minks DILATION;  Surgeon: [name], MD;  [facility];  Service: Endoscopy;  Laterality: N/A;  . MULTIPLE TOOTH EXTRACTIONS    . PV angiogram  02/18/2014   tibial vessel diseas bil.  [name] SPINE SURGERY    . tendon achillies lengthing and sesamoid       Current Meds  Medication Sig  . aspirin EC 81 MG EC tablet Take 1 tablet (81 mg total) by mouth daily. (Patient taking differently: Take 81 mg by mouth at bedtime.)  . b complex vitamins tablet Take 1 tablet by mouth daily.  . carvedilol (COREG) 3.125 MG tablet Take 1 tablet (3.125 mg total) by mouth 2 (two) times daily with a meal.  . Cholecalciferol (VITAMIN D3) 5000 units TABS Take 5,000 Units by mouth 2 (two) times daily.   . clopidogrel (PLAVIX) 75 MG tablet Take 1 tablet (75 mg total) by mouth daily.  . Continuous Blood Gluc Receiver (FREESTYLE  LIBRE 14 DAY READER) DEVI   . Continuous Blood Gluc Sensor (FREESTYLE LIBRE 14 DAY SENSOR) MISC   . Cyanocobalamin 1500 MCG TBDP Take 1,500 mcg by mouth daily.   . DULoxetine (CYMBALTA) 60 MG capsule Take 1 capsule (60 mg total) by mouth daily.  . empagliflozin (JARDIANCE) 25 MG TABS tablet Take 1 tablet (25 mg total) by mouth daily before breakfast.  . furosemide (LASIX) 40 MG tablet TAKE ONE TABLET (40MG  TOTAL) BY MOUTH TWO TIMES DAILY.  [name] glimepiride (AMARYL) 4 MG tablet Take 1 tablet (4 mg total) by mouth daily with breakfast.  . levothyroxine (SYNTHROID) 75 MCG tablet Take 1 tablet (75 mcg total) by mouth daily before breakfast.  . linaclotide (LINZESS) 290 MCG CAPS capsule Take 290 mcg by mouth daily before breakfast.  . Multiple Vitamin (MULTIVITAMIN WITH MINERALS) TABS tablet Take 1 tablet by mouth daily.  . nitroGLYCERIN (NITROSTAT) 0.4 MG SL tablet Place 1 tablet (0.4 mg  total) under the tongue every 5 (five) minutes as needed for chest pain.  . pantoprazole (PROTONIX) 40 MG tablet TAKE ONE (1) TABLET BY MOUTH EVERY DAY  . potassium chloride (KLOR-CON) 10 MEQ tablet Take 1 tablet (10 mEq total) by mouth daily. Please schedule appointment with Dr. Gwenlyn Found  . pramipexole (MIRAPEX) 0.25 MG tablet Take 2 tablets (0.5 mg total) by mouth 2 (two) times daily. Take 0.5mg  by mouth at 7 PM and 0.5mg  at 9 PM daily.  . rosuvastatin (CRESTOR) 20 MG tablet Take 1 tablet (20 mg total) by mouth daily.     Allergies:   Codeine and Tramadol   ROS:   Please see the history of present illness.    All other systems reviewed and are negative.   Labs/Other Tests and Data Reviewed:    Recent Labs: 09/25/2020: ALT 12; BUN 14; Creatinine, Ser 1.00; Hemoglobin 11.3; Platelets 207; Potassium 3.3; Sodium 139; TSH 4.410   Recent Lipid Panel Lab Results  Component Value Date/Time   CHOL 141 09/25/2020 08:59 AM   TRIG 86 09/25/2020 08:59 AM   HDL 50 09/25/2020 08:59 AM   CHOLHDL 2.8 09/25/2020 08:59 AM   CHOLHDL 3.9 11/26/2014 09:30 AM   LDLCALC 75 09/25/2020 08:59 AM    Wt Readings from Last 3 Encounters:  09/30/20 213 lb (96.6 kg)  08/11/20 165 lb (74.8 kg)  07/11/20 207 lb (93.9 kg)     Objective:    Vital Signs:  Ht 5\' 9"  (1.753 m)   Wt 213 lb (96.6 kg)   BMI 31.45 kg/m    VITAL SIGNS:  reviewed GEN:  no acute distress RESPIRATORY:  No shortness of breath noted in conversation PSYCH:  normal affect  ASSESSMENT & PLAN:     1. Diabetic ulcer of right midfoot associated with diabetes mellitus due to underlying condition, limited to breakdown of skin ([place])  - AMB referral to wound care center  2. Gait abnormality  - Ambulatory referral to Physical Therapy  3. Obstructive sleep apnea  - UNABLE TO FIND; Home health supplies for CPAP  Dispense: 1 Product; Refill: 0   Time:   Today, I have spent 10 minutes with the patient with telehealth technology  discussing the above problems.     Medication Adjustments/Labs and Tests Ordered: Current medicines are reviewed at length with the patient today.  Concerns regarding medicines are outlined above.   Tests Ordered: No orders of the defined types were placed in this encounter.   Medication Changes: No orders of the defined types  were placed in this encounter.    Disposition:  Follow up July  Signed, Perlie Mayo, NP  09/30/2020 11:36 AM     [place]

## 2020-10-08 ENCOUNTER — Ambulatory Visit (HOSPITAL_COMMUNITY): Payer: HMO | Attending: Family Medicine | Admitting: Physical Therapy

## 2020-10-08 DIAGNOSIS — E08621 Diabetes mellitus due to underlying condition with foot ulcer: Secondary | ICD-10-CM | POA: Insufficient documentation

## 2020-10-08 DIAGNOSIS — L97411 Non-pressure chronic ulcer of right heel and midfoot limited to breakdown of skin: Secondary | ICD-10-CM | POA: Insufficient documentation

## 2020-10-08 DIAGNOSIS — M25562 Pain in left knee: Secondary | ICD-10-CM | POA: Insufficient documentation

## 2020-10-08 DIAGNOSIS — R262 Difficulty in walking, not elsewhere classified: Secondary | ICD-10-CM | POA: Insufficient documentation

## 2020-10-08 DIAGNOSIS — G8929 Other chronic pain: Secondary | ICD-10-CM | POA: Insufficient documentation

## 2020-10-15 ENCOUNTER — Ambulatory Visit (HOSPITAL_COMMUNITY): Payer: HMO | Admitting: Physical Therapy

## 2020-10-17 ENCOUNTER — Encounter: Payer: Self-pay | Admitting: Podiatry

## 2020-10-17 ENCOUNTER — Other Ambulatory Visit: Payer: Self-pay

## 2020-10-17 ENCOUNTER — Ambulatory Visit (INDEPENDENT_AMBULATORY_CARE_PROVIDER_SITE_OTHER): Payer: HMO | Admitting: Podiatry

## 2020-10-17 DIAGNOSIS — Z89412 Acquired absence of left great toe: Secondary | ICD-10-CM | POA: Diagnosis not present

## 2020-10-17 DIAGNOSIS — E1142 Type 2 diabetes mellitus with diabetic polyneuropathy: Secondary | ICD-10-CM

## 2020-10-17 DIAGNOSIS — E1169 Type 2 diabetes mellitus with other specified complication: Secondary | ICD-10-CM

## 2020-10-17 DIAGNOSIS — B351 Tinea unguium: Secondary | ICD-10-CM | POA: Diagnosis not present

## 2020-10-19 NOTE — Progress Notes (Signed)
Subjective: [name] presents today at risk foot care. Patient has h/o diabetic with PAD with h/o amputation of L hallux and painful mycotic nails b/l that are difficult to trim.   He states he has upcoming appointment in [facility] on Tuesday. He states he has been applying Neosporin to right foot ulcer daily and keeping it clean. Denies any increased redness, drainage or pain. Denies any fever, chills, night sweats, nausea or vomiting.  Past Medical History:  Diagnosis Date  . Aftercare following surgery of the circulatory system, [place] 12/04/2013  . Angina decubitus ([place]) 05/19/2016  . Arrhythmia 05/20/2016  . Arthritis   . ARTHRITIS, RIGHT FOOT 06/26/2008   Qualifier: Diagnosis of  By: Aline Brochure MD, Dorothyann Peng    . Cardiomyopathy- EF NL 01/2013, now 30-35% echo 03/14/2012  . Carotid artery occlusion    left s/p CEA  . Cellulitis of left foot 05/07/2018  . CHF (congestive heart failure) ([place])   . Critical lower limb ischemia (HCC) 02/28/2014   Critical limb ischemia   . Diabetes mellitus   . Elevated troponin 04/11/2016  . GERD (gastroesophageal reflux disease)   . History of stroke June 2013 03/14/2012  . HOH (hard of hearing)   . Hypertension   . Hypothyroidism   . Nonhealing skin ulcer ([place]) 10/28/2014  . Obstructive sleep apnea   . Orthostatic hypotension   . Osteomyelitis ([place])    left great toe  . Pain in the chest 05/17/2016  . Peripheral arterial disease ([place]), s/p PTA x 2 RLE    nonhealing ulcers bilaterally on each great toe  . Pneumonia   . PONV (postoperative nausea and vomiting)   . Pulmonary nodule 05/17/2016  . Restless leg syndrome   . Shortness of breath   . Stroke [facility]) March 08, 2012  . Toe osteomyelitis, left ([place])   . Ulcer of great toe, left, with necrosis of bone ([place])   . Wears dentures      Current Outpatient Medications on File Prior to Visit  Medication Sig Dispense Refill  . aspirin EC 81 MG EC tablet Take 1 tablet (81 mg total) by mouth daily.  (Patient taking differently: Take 81 mg by mouth at bedtime.)    . b complex vitamins tablet Take 1 tablet by mouth daily.    . carvedilol (COREG) 3.125 MG tablet Take 1 tablet (3.125 mg total) by mouth 2 (two) times daily with a meal. 180 tablet 3  . Cholecalciferol (VITAMIN D3) 5000 units TABS Take 5,000 Units by mouth 2 (two) times daily.     . clopidogrel (PLAVIX) 75 MG tablet Take 1 tablet (75 mg total) by mouth daily. 90 tablet 0  . Continuous Blood Gluc Receiver (FREESTYLE LIBRE 14 DAY READER) DEVI     . Continuous Blood Gluc Sensor (FREESTYLE LIBRE 14 DAY SENSOR) MISC     . Cyanocobalamin 1500 MCG TBDP Take 1,500 mcg by mouth daily.     . DULoxetine (CYMBALTA) 60 MG capsule Take 1 capsule (60 mg total) by mouth daily. 90 capsule 1  . empagliflozin (JARDIANCE) 25 MG TABS tablet Take 1 tablet (25 mg total) by mouth daily before breakfast. 90 tablet 1  . furosemide (LASIX) 40 MG tablet TAKE ONE TABLET (40MG  TOTAL) BY MOUTH TWO TIMES DAILY. 180 tablet 3  . glimepiride (AMARYL) 4 MG tablet Take 1 tablet (4 mg total) by mouth daily with breakfast. 90 tablet 1  . levothyroxine (SYNTHROID) 75 MCG tablet Take 1 tablet (75 mcg total)  by mouth daily before breakfast. 90 tablet 1  . linaclotide (LINZESS) 290 MCG CAPS capsule Take 290 mcg by mouth daily before breakfast.    . Multiple Vitamin (MULTIVITAMIN WITH MINERALS) TABS tablet Take 1 tablet by mouth daily.    . nitroGLYCERIN (NITROSTAT) 0.4 MG SL tablet Place 1 tablet (0.4 mg total) under the tongue every 5 (five) minutes as needed for chest pain. 25 tablet 3  . pantoprazole (PROTONIX) 40 MG tablet TAKE ONE (1) TABLET BY MOUTH EVERY DAY 30 tablet 11  . potassium chloride (KLOR-CON) 10 MEQ tablet Take 1 tablet (10 mEq total) by mouth daily. Please schedule appointment with Dr. Gwenlyn Found 90 tablet 0  . pramipexole (MIRAPEX) 0.25 MG tablet Take 2 tablets (0.5 mg total) by mouth 2 (two) times daily. Take 0.5mg  by mouth at 7 PM and 0.5mg  at 9 PM daily. 360  tablet 3  . rosuvastatin (CRESTOR) 20 MG tablet Take 1 tablet (20 mg total) by mouth daily. 90 tablet 1  . UNABLE TO FIND Home health supplies for CPAP 1 Product 0   No current facility-administered medications on file prior to visit.     Allergies  Allergen Reactions  . Codeine Nausea And Vomiting  . Tramadol Nausea Only    Objective: [name] is a pleasant 75 y.o.  Caucasian male, in NAD. AAO x 3. There were no vitals filed for this visit.  Vascular Examination: Neurovascular status unchanged b/l lower extremities. Capillary refill time to digits immediate b/l. Palpable DP pulses b/l. Nonpalpable PT pulse(s) b/l lower extremities. Pedal hair absent b/l Skin temperature gradient within normal limits b/l.  Dermatological Examination: Pedal skin is thin shiny, atrophic bilaterally. No open wounds bilaterally. No interdigital macerations bilaterally. Toenails 2-5 bilaterally and R hallux elongated, discolored, dystrophic, thickened, and crumbly with subungual debris and tenderness to dorsal palpation. Hyperkeratotic lesion(s) distal stump L hallux.  No erythema, no edema, no drainage, no flocculence.   Wound Location: submet head 1 right foot   There is moderate amount of non-viable hyperkeratoti tissue present in the wound. Wound Measurement: 2.0 x 2.0 x 0.2 cm   Wound Base: Hyperkeratosis with central pinpoint hole Peri-wound: Calloused Exudate: None: wound tissue dry Description of tissue removed from ulceration today: callous and necrotic tissue Signs of clinical bacterial infection are absent..  Musculoskeletal: Normal muscle strength 5/5 to all lower extremity muscle groups bilaterally. No pain crepitus or joint limitation noted with ROM b/l. No gross bony deformities bilaterally. Lower extremity amputation(s): digital amputation L hallux.  Neurological Examination: Protective sensation intact 5/5 intact bilaterally with 10g monofilament b/l. Vibratory sensation  intact b/l.   Xray findings right foot, 3 views: No gas in tissues. No bone erosion noted at location of ulceration. Hardware remains in place across 1st MPJ.  Assessment: 1. Onychomycosis of multiple toenails with type 2 diabetes mellitus and peripheral neuropathy ([place])   2. Status post amputation of left great toe ([place])   3. Diabetic peripheral neuropathy associated with type 2 diabetes mellitus ([place])    Plan: -Patient was evaluated and treated and all questions answered.  -Ulceration cleansed with wound cleanser. Betadine applied to base of ulceration and secured with light dressing. He will attend [facility] on this Tuesday. -Patient risk factors affecting healing of ulcer: diabetes, diabetic neuropathy, PAD, history of prior amputation, foot deformity. -Toenails 2-5 b/ and right hallux were debrided in length and girth with sterile nail nippers and dremel without iatrogenic bleeding.  -Patient to report any pedal  injuries to medical professional immediately. -Patient/POA to call should there be question/concern in the interim.  Return in about 3 months (around 10/14/2020) to see me for at risk foot care.   Marzetta Board, DPM

## 2020-10-20 ENCOUNTER — Other Ambulatory Visit: Payer: Self-pay

## 2020-10-20 ENCOUNTER — Ambulatory Visit (HOSPITAL_COMMUNITY): Payer: HMO | Admitting: Physical Therapy

## 2020-10-20 ENCOUNTER — Encounter (INDEPENDENT_AMBULATORY_CARE_PROVIDER_SITE_OTHER): Payer: Self-pay

## 2020-10-20 ENCOUNTER — Encounter (HOSPITAL_COMMUNITY): Payer: Self-pay | Admitting: Physical Therapy

## 2020-10-20 DIAGNOSIS — E08621 Diabetes mellitus due to underlying condition with foot ulcer: Secondary | ICD-10-CM

## 2020-10-20 DIAGNOSIS — G8929 Other chronic pain: Secondary | ICD-10-CM | POA: Diagnosis not present

## 2020-10-20 DIAGNOSIS — M25562 Pain in left knee: Secondary | ICD-10-CM | POA: Diagnosis not present

## 2020-10-20 DIAGNOSIS — R262 Difficulty in walking, not elsewhere classified: Secondary | ICD-10-CM | POA: Diagnosis not present

## 2020-10-20 DIAGNOSIS — L97411 Non-pressure chronic ulcer of right heel and midfoot limited to breakdown of skin: Secondary | ICD-10-CM

## 2020-10-20 NOTE — Therapy (Signed)
[place], [place], [address] Phone: [phone]   Fax:  [phone]  Wound Care Evaluation  Patient Details  Name: [name] MRN: [id number] Date of Birth: [birth year] Referring Provider (PT): Perlie Mayo   Encounter Date: 10/20/2020   PT End of Session - 10/20/20 0914    Visit Number 1    Number of Visits 16    Date for PT Re-Evaluation 12/15/20    Authorization Type [employer], no VL, no auth , $15 co pay    Progress Note Due on Visit 10    PT Start Time 0922   pt had to use restroom at start of session   PT Stop Time 1033    PT Time Calculation (min) 71 min    Activity Tolerance Patient tolerated treatment well    Behavior During Therapy [facility] for tasks assessed/performed           Past Medical History:  Diagnosis Date  . Aftercare following surgery of the circulatory system, [place] 12/04/2013  . Angina decubitus ([place]) 05/19/2016  . Arrhythmia 05/20/2016  . Arthritis   . ARTHRITIS, RIGHT FOOT 06/26/2008   Qualifier: Diagnosis of  By: Aline Brochure MD, Dorothyann Peng    . Cardiomyopathy- EF NL 01/2013, now 30-35% echo 03/14/2012  . Carotid artery occlusion    left s/p CEA  . Cellulitis of left foot 05/07/2018  . CHF (congestive heart failure) ([place])   . Critical lower limb ischemia (HCC) 02/28/2014   Critical limb ischemia   . Diabetes mellitus   . Elevated troponin 04/11/2016  . GERD (gastroesophageal reflux disease)   . History of stroke June 2013 03/14/2012  . HOH (hard of hearing)   . Hypertension   . Hypothyroidism   . Nonhealing skin ulcer ([place]) 10/28/2014  . Obstructive sleep apnea   . Orthostatic hypotension   . Osteomyelitis ([place])    left great toe  . Pain in the chest 05/17/2016  . Peripheral arterial disease ([place]), s/p PTA x 2 RLE    nonhealing ulcers bilaterally on each great toe  . Pneumonia   . PONV (postoperative nausea and vomiting)   . Pulmonary nodule 05/17/2016  . Restless leg  syndrome   . Shortness of breath   . Stroke [facility]) March 08, 2012  . Toe osteomyelitis, left ([place])   . Ulcer of great toe, left, with necrosis of bone ([place])   . Wears dentures     Past Surgical History:  Procedure Laterality Date  . AMPUTATION TOE Left 07/25/2018   Procedure: AMPUTATION TOE INTERPHALANGEAL HALLUX LEFT;  Surgeon: Evelina Bucy, DPM;  Location: [place];  Service: Podiatry;  Laterality: Left;  . ANGIOPLASTY  02/28/14   diamond back orbital rotational atherectomy of Rt. tibial  . BACK SURGERY    . BONE BIOPSY Left 07/25/2018   Procedure: SUPERFICIAL BONE BIOPSY;  Surgeon: Evelina Bucy, DPM;  Location: [facility];  Service: Podiatry;  Laterality: Left;  . CARDIAC CATHETERIZATION N/A 05/19/2016   Procedure: Right/Left Heart Cath and Coronary Angiography;  Surgeon: Belva Crome, MD;  Location: [place];  Service: Cardiovascular;  Laterality: N/A;  . CERVICAL FUSION    . ENDARTERECTOMY Left 11/29/2013   Procedure: ENDARTERECTOMY CAROTID;  Surgeon: Serafina Mitchell, MD;  Location: [place];  Service: Vascular;  Laterality: Left;  . ESOPHAGOGASTRODUODENOSCOPY  02/2010   Dr. Gala Romney: patient presented with food impaction, schatzki ring with superimposed component of stricture with erosive  reflux esophagitis, s/p disimpaction but dilation planned at later date   . ESOPHAGOGASTRODUODENOSCOPY (EGD) WITH PROPOFOL N/A 08/13/2019   Dr. Gala Romney: Erosive reflux esophagitis with mild stricture and incidental Mallory-Weiss tear which precluded esophageal dilation.  Medium sized hiatal hernia.  [name] ESOPHAGOGASTRODUODENOSCOPY (EGD) WITH PROPOFOL N/A 10/11/2019   Dr. Gala Romney: Esophageal stenosis status post dilation, moderate hiatal hernia  . EYE SURGERY    . FLEXIBLE SIGMOIDOSCOPY N/A 08/13/2019   Procedure: FLEXIBLE SIGMOIDOSCOPY;  Surgeon: [name], MD;  [facility];  Service: Endoscopy;  Laterality: N/A;  colonoscopy aboerted due to formed stool and poor prep  . FOOT SURGERY     . KNEE ARTHROSCOPY WITH MEDIAL MENISECTOMY Left 05/06/2020   Procedure: KNEE ARTHROSCOPY WITH MEDIAL MENISCECTOMY AND LATERAL MENISCECTOMY;  Surgeon: [name], MD;  Location: [facility];  Service: Orthopedics;  Laterality: Left;  . Lower ext duplex doppler  03/14/14   Rt ABI 1.2  . LOWER EXTREMITY ANGIOGRAM Bilateral 02/18/2014   Procedure: LOWER EXTREMITY ANGIOGRAM;  Surgeon: [name], MD;  Location: [facility] CATH LAB;  Service: Cardiovascular;  Laterality: Bilateral;  . LOWER EXTREMITY ANGIOGRAM N/A 10/31/2014   Procedure: LOWER EXTREMITY ANGIOGRAM;  Surgeon: [name], MD;  Location: [facility] CATH LAB;  Service: Cardiovascular;  Laterality: N/A;  . Venia Minks DILATION N/A 10/11/2019   Procedure: Venia Minks DILATION;  Surgeon: [name], MD;  [facility];  Service: Endoscopy;  Laterality: N/A;  . MULTIPLE TOOTH EXTRACTIONS    . PV angiogram  02/18/2014   tibial vessel diseas bil.  [name] SPINE SURGERY    . tendon achillies lengthing and sesamoid      There were no vitals filed for this visit.   Subjective Assessment - 10/20/20 0940    Subjective States he had left knee surgery on 05/06/20 by Dr. Aline Brochure and states that occasionally it gives him a funny feeling like buckles. States overall it feels better compared to before surgery. No current pain noted in left knee. Reports that he is not currently doing any exercises and didn't have any PT after surgery. Reports balance is terrible and falls a lot states he last fell on Friday and hit his head (has a bruise/abrasion on the right upper head. States that a lot of times standing still and it feels like he is falling and he jerks and sometimes falls down. States that this doesn't favor a direction. Reports that he can't walk backwards otherwise he falls down. States he primarily uses the cane at home and in the community. States he doesn't use a walker because it is not as convenient and is bulky. States that since his knee started  hurting hem has not been able to come to wound care. He has been trying to keep it clean, puts Neosporin on it and tries to keep if covered with a band aid. States that the band aid comes off a lot though. States he doesn't have compression garments    Pertinent History CHF , R foot wound, left knee scope, DB, Restless leg syndrome, hx of stroke.    How long can you walk comfortably? 10    Patient Stated Goals be able to walk normally and function correctly.    Currently in Pain? No/denies            [facility] PT Assessment - 10/20/20 0001      Assessment   Medical Diagnosis R foot ulcer and gait instability    Referring Provider (PT) Jarrett Soho  Trude Mcburney    Prior Therapy yes for right wound      Precautions   Precautions None      Balance Screen   Has the patient fallen in the past 6 months Yes    How many times? 20    Has the patient had a decrease in activity level because of a fear of falling?  Yes    Is the patient reluctant to leave their home because of a fear of falling?  Yes      [place] residence    Living Arrangements Spouse/significant other    Available Help at Discharge Family    Type of [place] Multi-level   lives on the third floor , first floor is basement   Alternate Level Stairs-Number of Steps 11    Alternate Level Stairs-Rails Right    Home Equipment Walker - 2 wheels;Cane - single point;Bedside commode;Shower seat;Grab bars - tub/shower    Additional Comments wife helps but just had open heart surgery.      Prior Function   Level of Independence Independent with household mobility with device;Independent with transfers      Cognition   Overall Cognitive Status Within Functional Limits for tasks assessed      Observation/Other Assessments   Observations laceration to right forehead and right swollen eye    Focus on Therapeutic Outcomes (FOTO)  N/a      Observation/Other Assessments-Edema    Edema --    pitting edema bilaterally 3+ in lower legs - reports he does not wear compression garments.     ROM / Strength   AROM / PROM / Strength AROM;Strength      AROM   AROM Assessment Site Knee    Right/Left Knee Right;Left    Right Knee Extension 5   lacking, measured in long sitting   Right Knee Flexion 110   measured in long sitting   Left Knee Extension 18   lacking, measured in long sitting   Left Knee Flexion 110   tightness all over, measured in long sitting     Strength   Strength Assessment Site --   able to elicit strong quad contraction on left and right.     Palpation   Palpation comment no tenderness noted with palpaiton around leftt knee      Transfers   Transfers Sit to Stand;Stand to Sit    Sit to Stand 5: Supervision;With upper extremity assist    Stand to Sit 5: Supervision;With upper extremity assist      Ambulation/Gait   Ambulation/Gait Yes    Ambulation/Gait Assistance 5: Supervision    Ambulation Distance (Feet) 56 Feet    Assistive device Rolling walker    Gait Pattern Step-to pattern;Decreased weight shift to left;Decreased hip/knee flexion - left;Decreased hip/knee flexion - right;Scissoring   crouched gait, anterior trunk lean   Ambulation Surface Level;Indoor    Gait velocity decreased    Gait Comments 2MW           Wound Therapy - 10/20/20 0001    Subjective see above    Patient and Family Stated Goals to have wound to heal and to be able to walk    Date of Onset --   4-5 years ago   Prior Treatments wound clinics, neosporin on bandaid but bandaid wound stick.    Pain Scale Faces    Faces Pain Scale Hurts a little bit  Evaluation and Treatment Procedures Explained to Patient/Family Yes    Evaluation and Treatment Procedures agreed to    Wound Properties Date First Assessed: 10/20/20 Time First Assessed: 1000 Wound Type: Diabetic ulcer Location: Foot Location Orientation: Anterior;Right Wound Description (Comments): bottom of foot Present on  Admission: Yes   Wound Image View All Images View Images    Dressing Type --   bandaid   Dressing Changed Changed    Dressing Status Old drainage    Dressing Change Frequency PRN    Site / Wound Assessment Red    % Wound base Red or Granulating 100%    Peri-wound Assessment --   callus, edema   Wound Length (cm) 1.6 cm   small wound area right above larger wound (looks to be one large wound) currently measuring at .4x.2   Wound Width (cm) 1.8 cm    Wound Depth (cm) 0.8 cm    Wound Volume (cm^3) 2.3 cm^3    Wound Surface Area (cm^2) 2.88 cm^2    Undermining (cm) from 12-6 o'clock    Margins Epibole (rolled edges)    Drainage Amount Scant    Drainage Description Serosanguineous    Treatment Debridement (Selective);Cleansed    Selective Debridement - Location right forefoot    Selective Debridement - Tools Used Forceps;Scalpel    Selective Debridement - Tissue Removed callus, devitalized tissue    Wound Therapy - Functional Problem List difficulty walking, washing, dressing    Factors Delaying/Impairing Wound Healing Diabetes Mellitus;Immobility   left knee surgery, CHF, pitting edema   Wound Therapy - Frequency 2X / week    Wound Therapy - Current Recommendations PT    Wound Plan debride callus, monitor for restless leg syndrom    Dressing  medihonye on 2x2, profore lite with number 2 netting - light dressing ar foot to make sure foot fits in shoe                 Objective measurements completed on examination: See above findings.     [facility] Adult PT Treatment/Exercise - 10/20/20 0001      Exercises   Exercises Knee/Hip      Knee/Hip Exercises: Stretches   Gastroc Stretch Left;3 reps;60 seconds   with strap                 PT Education - 10/20/20 1129    Education Details on current condition, HEP, wound presentation and plan, risk of continued falls, importance of using RW instead of  cane. on hoe to doff profore lite if needed    Person(s) Educated Patient     Methods Explanation    Comprehension Verbalized understanding            PT Short Term Goals - 10/20/20 1153      PT SHORT TERM GOAL #1   Title Patient will be independent in self management strategies to improve quality of life and functional outcomes.    Time 4    Period Weeks    Status New    Target Date 11/17/20      PT SHORT TERM GOAL #2   Title Patient will be able to report at least 25% improvement in overall mobility.    Time 4    Period Weeks    Status New    Target Date 11/17/20      PT SHORT TERM GOAL #3   Title Patient's wound will be reduced in volume by at least 25% to demonstrate improved wound  healing    Baseline 2.3 cm^3    Time 4    Period Weeks    Status New    Target Date 11/17/20             PT Long Term Goals - 10/20/20 1154      PT LONG TERM GOAL #1   Title Patient will be able to ambulate at least 100 feet with RW in 2 minutes to demonstrate improved ambulatory endurance    Time 8    Period Weeks    Status New    Target Date 12/15/20      PT LONG TERM GOAL #2   Title Patient's wound will reduce in overall volume byt at least 50% to demonstrate improved wound healing    Baseline 2.3 cm^3    Time 8    Period Weeks    Status New    Target Date 12/15/20      PT LONG TERM GOAL #3   Title Patient will be able to demonstate 5-110 degrees in left knee to demonstrate improved knee mobility    Time 8    Period Weeks    Status New    Target Date 12/15/20                Plan - 10/20/20 1131    Clinical Impression Statement Patient presents to therapy with complaints of left knee pain after having knee surgery last year. Patient with frequency history of falling with most recent fall last Friday resulting in abrasion to right forehead. Patient also with order for wound care to right foot for chronic diabetic ulcer. Patient with callus surrounding wound and what looked like old dried drainage that started to indent surrounding tissue.  Wound bed bigger then when he was last in clinic and looks like it is larger than currently measured as there is a spot just superior to the wound that looks like it could be apart of the original wound. Educated patient in wound care, home exercises, and POC. Dressed wound with medihoney, 2x2 and profore lite. Patient would greatly benefit from skilled physical therapy to promote optimal wound healing and to reduce risk of falling.    Personal Factors and Comorbidities Comorbidity 1;Comorbidity 3+;Comorbidity 2    Comorbidities CHF, left knee surgery, right chronic foot wound, DB, restless leg syndromes    Examination-Activity Limitations Bathing;Lift;Locomotion Level;Transfers;Stand;Stairs;Squat    Examination-Participation Restrictions Community Activity;Meal Prep    Stability/Clinical Decision Making Evolving/Moderate complexity    Clinical Decision Making Moderate    Rehab Potential Fair    PT Frequency 2x / week    PT Duration 8 weeks    PT Treatment/Interventions ADLs/Self Care Home Management;Other (comment);Moist Heat;Balance training;Therapeutic exercise;Therapeutic activities;Functional mobility training;Stair training;Gait training;DME Instruction;Ultrasound;Neuromuscular re-education;Patient/family education;Orthotic Fit/Training;Manual techniques;Taping;Passive range of motion;Electrical Stimulation   profore and wound care   PT Next Visit Plan continue debridment and dressing changes, test, balance, HEP development, knee extension .    PT Home Exercise Plan calf stretch with towel    Consulted and Agree with Plan of Care Patient           Patient will benefit from skilled therapeutic intervention in order to improve the following deficits and impairments:  Abnormal gait,Decreased endurance,Decreased skin integrity,Increased edema,Decreased activity tolerance,Decreased balance,Decreased mobility,Decreased knowledge of use of DME,Decreased strength,Difficulty  walking,Pain,Decreased range of motion,Impaired flexibility  Visit Diagnosis: Difficulty in walking, not elsewhere classified  Chronic pain of left knee  Diabetic ulcer of right midfoot associated with diabetes  mellitus due to underlying condition, limited to breakdown of skin [facility])    Problem List Patient Active Problem List   Diagnosis Date Noted  . Gait abnormality 09/30/2020  . Mild non proliferative diabetic retinopathy ([place]) 07/16/2020  . Need for immunization against influenza 06/03/2020  . S/P left knee arthroscopy 05/06/20 05/13/2020  . Abnormal MRI 03/26/2020  . Joint disorder of knee 03/26/2020  . Fall at home, initial encounter 03/13/2020  . Anemia 09/03/2019  . Reflux esophagitis 09/03/2019  . Esophageal dysphagia 05/30/2019  . Constipation 05/30/2019  . Positive colorectal cancer screening using Cologuard test 05/30/2019  . Diabetic foot ulcer ([place]) 09/06/2018  . Congestive heart failure ([place]) 05/17/2016  . Bilateral carotid artery disease ([place]) 07/08/2014  . Obstructive sleep apnea 02/12/2014  . Restless leg syndrome 10/23/2013  . Overweight with body mass index (BMI) of 29 to 29.9 in adult 10/23/2013  . Hyperlipidemia 07/16/2013  . Peripheral arterial disease ([place]) 07/16/2013  . Hypertension 03/14/2012  . Type 2 diabetes mellitus with circulatory disorder ([place]) 03/14/2012  . Peripheral neuropathy 03/14/2012  . Cardiomyopathy- EF NL 01/2013, now 30-35% echo 03/14/2012    12:25 PM, 10/20/20 [name], DPT Physical Therapy with [facility]  [phone] office  [place] [address] [place], [place], [address] Phone: [phone]   Fax:  [phone]  Name: BERRY GODSEY MRN: [id number] Date of Birth: Nov 05, 1945

## 2020-10-21 ENCOUNTER — Ambulatory Visit: Payer: PPO | Admitting: Family Medicine

## 2020-10-22 ENCOUNTER — Ambulatory Visit (HOSPITAL_COMMUNITY): Payer: HMO | Attending: Family Medicine

## 2020-10-22 ENCOUNTER — Encounter (HOSPITAL_COMMUNITY): Payer: Self-pay

## 2020-10-22 ENCOUNTER — Other Ambulatory Visit: Payer: Self-pay

## 2020-10-22 DIAGNOSIS — L97411 Non-pressure chronic ulcer of right heel and midfoot limited to breakdown of skin: Secondary | ICD-10-CM | POA: Diagnosis not present

## 2020-10-22 DIAGNOSIS — R262 Difficulty in walking, not elsewhere classified: Secondary | ICD-10-CM | POA: Diagnosis not present

## 2020-10-22 DIAGNOSIS — G8929 Other chronic pain: Secondary | ICD-10-CM | POA: Diagnosis not present

## 2020-10-22 DIAGNOSIS — M25562 Pain in left knee: Secondary | ICD-10-CM | POA: Diagnosis not present

## 2020-10-22 DIAGNOSIS — E08621 Diabetes mellitus due to underlying condition with foot ulcer: Secondary | ICD-10-CM

## 2020-10-22 NOTE — Therapy (Signed)
[facility] [place], [place], [address] Phone: [phone]   Fax:  [phone]  Physical Therapy Treatment  Patient Details  Name: [name] MRN: [id number] Date of Birth: 01-10-1946 Referring Provider (PT): Perlie Mayo   Encounter Date: 10/22/2020   PT End of Session - 10/22/20 0934    Visit Number 2    Number of Visits 16    Date for PT Re-Evaluation 12/15/20    Authorization Type [employer], no VL, no auth , $15 co pay    Progress Note Due on Visit 10    PT Start Time 0923    PT Stop Time 1045    PT Time Calculation (min) 82 min    Equipment Utilized During Treatment Gait belt    Activity Tolerance Patient tolerated treatment well;Patient limited by fatigue    Behavior During Therapy [facility] for tasks assessed/performed           Past Medical History:  Diagnosis Date  . Aftercare following surgery of the circulatory system, [place] 12/04/2013  . Angina decubitus ([place]) 05/19/2016  . Arrhythmia 05/20/2016  . Arthritis   . ARTHRITIS, RIGHT FOOT 06/26/2008   Qualifier: Diagnosis of  By: Aline Brochure MD, Dorothyann Peng    . Cardiomyopathy- EF NL 01/2013, now 30-35% echo 03/14/2012  . Carotid artery occlusion    left s/p CEA  . Cellulitis of left foot 05/07/2018  . CHF (congestive heart failure) ([place])   . Critical lower limb ischemia (HCC) 02/28/2014   Critical limb ischemia   . Diabetes mellitus   . Elevated troponin 04/11/2016  . GERD (gastroesophageal reflux disease)   . History of stroke June 2013 03/14/2012  . HOH (hard of hearing)   . Hypertension   . Hypothyroidism   . Nonhealing skin ulcer ([place]) 10/28/2014  . Obstructive sleep apnea   . Orthostatic hypotension   . Osteomyelitis ([place])    left great toe  . Pain in the chest 05/17/2016  . Peripheral arterial disease ([place]), s/p PTA x 2 RLE    nonhealing ulcers bilaterally on each great toe  . Pneumonia   . PONV (postoperative nausea and vomiting)   . Pulmonary  nodule 05/17/2016  . Restless leg syndrome   . Shortness of breath   . Stroke [facility]) March 08, 2012  . Toe osteomyelitis, left ([place])   . Ulcer of great toe, left, with necrosis of bone ([place])   . Wears dentures     Past Surgical History:  Procedure Laterality Date  . AMPUTATION TOE Left 07/25/2018   Procedure: AMPUTATION TOE INTERPHALANGEAL HALLUX LEFT;  Surgeon: Evelina Bucy, DPM;  Location: [place];  Service: Podiatry;  Laterality: Left;  . ANGIOPLASTY  02/28/14   diamond back orbital rotational atherectomy of Rt. tibial  . BACK SURGERY    . BONE BIOPSY Left 07/25/2018   Procedure: SUPERFICIAL BONE BIOPSY;  Surgeon: Evelina Bucy, DPM;  Location: [place];  Service: Podiatry;  Laterality: Left;  . CARDIAC CATHETERIZATION N/A 05/19/2016   Procedure: Right/Left Heart Cath and Coronary Angiography;  Surgeon: Belva Crome, MD;  Location: [facility];  Service: Cardiovascular;  Laterality: N/A;  . CERVICAL FUSION    . ENDARTERECTOMY Left 11/29/2013   Procedure: ENDARTERECTOMY CAROTID;  Surgeon: Serafina Mitchell, MD;  Location: [place];  Service: Vascular;  Laterality: Left;  . ESOPHAGOGASTRODUODENOSCOPY  02/2010   Dr. Gala Romney: patient presented with food impaction, schatzki ring with superimposed component of stricture  with erosive reflux esophagitis, s/p disimpaction but dilation planned at later date   . ESOPHAGOGASTRODUODENOSCOPY (EGD) WITH PROPOFOL N/A 08/13/2019   Dr. Gala Romney: Erosive reflux esophagitis with mild stricture and incidental Mallory-Weiss tear which precluded esophageal dilation.  Medium sized hiatal hernia.  [name] ESOPHAGOGASTRODUODENOSCOPY (EGD) WITH PROPOFOL N/A 10/11/2019   Dr. Gala Romney: Esophageal stenosis status post dilation, moderate hiatal hernia  . EYE SURGERY    . FLEXIBLE SIGMOIDOSCOPY N/A 08/13/2019   Procedure: FLEXIBLE SIGMOIDOSCOPY;  Surgeon: [name], MD;  [facility];  Service: Endoscopy;  Laterality: N/A;  colonoscopy aboerted due to formed stool  and poor prep  . FOOT SURGERY    . KNEE ARTHROSCOPY WITH MEDIAL MENISECTOMY Left 05/06/2020   Procedure: KNEE ARTHROSCOPY WITH MEDIAL MENISCECTOMY AND LATERAL MENISCECTOMY;  Surgeon: [name], MD;  Location: [facility];  Service: Orthopedics;  Laterality: Left;  . Lower ext duplex doppler  03/14/14   Rt ABI 1.2  . LOWER EXTREMITY ANGIOGRAM Bilateral 02/18/2014   Procedure: LOWER EXTREMITY ANGIOGRAM;  Surgeon: [name], MD;  Location: [facility] CATH LAB;  Service: Cardiovascular;  Laterality: Bilateral;  . LOWER EXTREMITY ANGIOGRAM N/A 10/31/2014   Procedure: LOWER EXTREMITY ANGIOGRAM;  Surgeon: [name], MD;  Location[facility] C[facility];  Service: Cardiovascular;  Laterality: N/A;  . Venia Minks DILATION N/A 10/11/2019   Procedure: Venia Minks DILATION;  Surgeon: [name], MD;  [facility];  Service: Endoscopy;  Laterality: N/A;  . MULTIPLE TOOTH EXTRACTIONS    . PV angiogram  02/18/2014   tibial vessel diseas bil.  [name] SPINE SURGERY    . tendon achillies lengthing and sesamoid      There were no vitals filed for this visit.   Subjective Assessment - 10/22/20 0933    Subjective Pt reports he was walking backwards yesterday and fell, hitting his head. No reports of blacking out. No reports of pain currently.    Pertinent History CHF , R foot wound, left knee scope, DB, Restless leg syndrome, hx of stroke.    Patient Stated Goals be able to walk normally and function correctly.    Currently in Pain? No/denies              [facility] PT Assessment - 10/22/20 0001      Assessment   Medical Diagnosis R foot ulcer and gait instability    Referring Provider (PT) Perlie Mayo    Prior Therapy yes for right wound                       Wound Therapy - 10/22/20 0001    Subjective Pt reports he was walking backwards yesterday and fell, hitting his head. No reports of blacking out. No reports of pain currently.    Patient and Family Stated Goals to have wound to  heal and to be able to walk    Date of Onset --   4-5 years ago   Prior Treatments wound clinics, neosporin on bandaid but bandaid wound stick.    Pain Scale 0-10    Pain Score 0-No pain    Evaluation and Treatment Procedures Explained to Patient/Family Yes    Evaluation and Treatment Procedures agreed to    Wound Properties Date First Assessed: 10/20/20 Time First Assessed: 1000 Wound Type: Diabetic ulcer Location: Foot Location Orientation: Anterior;Right Wound Description (Comments): bottom of foot Present on Admission: Yes   Wound Image View All Images View Images    Dressing  Type --   medihoney, xeroform, 2x2, medipore tape   Dressing Changed Changed    Dressing Status Old drainage    Dressing Change Frequency PRN    Site / Wound Assessment Red    % Wound base Red or Granulating 100%    Peri-wound Assessment Edema   callous, edema   Margins Epibole (rolled edges)    Drainage Amount Scant    Drainage Description Serosanguineous    Treatment Cleansed;Debridement (Selective)            [facility] Adult PT Treatment/Exercise - 10/22/20 0001      Bed Mobility   Bed Mobility Not assessed      Ambulation/Gait   Ambulation/Gait Yes    Ambulation/Gait Assistance 4: Min assist    Ambulation Distance (Feet) 48 Feet   4 sets, 48x2; 60x 2   Assistive device Straight cane    Gait Pattern Step-to pattern;Decreased weight shift to left;Decreased hip/knee flexion - left;Decreased hip/knee flexion - right;Scissoring;Shuffle   crouched gait, anterior trunk lean, NBOS   Ambulation Surface Level;Indoor    Gait velocity decreased    Gait Comments 2MWT 67ft      Exercises   Exercises Knee/Hip      Knee/Hip Exercises: Supine   Bridges 5 reps    Bridges Limitations 5" holds                    PT Short Term Goals - 10/20/20 1153      PT SHORT TERM GOAL #1   Title Patient will be independent in self management strategies to improve quality of life and functional outcomes.    Time 4     Period Weeks    Status New    Target Date 11/17/20      PT SHORT TERM GOAL #2   Title Patient will be able to report at least 25% improvement in overall mobility.    Time 4    Period Weeks    Status New    Target Date 11/17/20      PT SHORT TERM GOAL #3   Title Patient's wound will be reduced in volume by at least 25% to demonstrate improved wound healing    Baseline 2.3 cm^3    Time 4    Period Weeks    Status New    Target Date 11/17/20             PT Long Term Goals - 10/20/20 1154      PT LONG TERM GOAL #1   Title Patient will be able to ambulate at least 100 feet with RW in 2 minutes to demonstrate improved ambulatory endurance    Time 8    Period Weeks    Status New    Target Date 12/15/20      PT LONG TERM GOAL #2   Title Patient's wound will reduce in overall volume byt at least 50% to demonstrate improved wound healing    Baseline 2.3 cm^3    Time 8    Period Weeks    Status New    Target Date 12/15/20      PT LONG TERM GOAL #3   Title Patient will be able to demonstate 5-110 degrees in left knee to demonstrate improved knee mobility    Time 8    Period Weeks    Status New    Target Date 12/15/20  Plan - 10/22/20 1321    Clinical Impression Statement Pt arrived with SPC, required min A for LOB following handwash.  Pt ambulates shuffled, scissor gait, able to complete 24ft during 2MWT with A for safety to reduce risk of fall.  Encouraged pt to ambulate with RW vs cane for safety.  Dressings had slid up foot with increased edema distal especially toes and wound exposed.  Wound care included cleansing foot and selective debridement for removal of callous perimeter of wound.  Added medipore tape over wound and toe wrap to address edema, continued wiht profore lite to address LE edema.  Reports of comfort with dressings.  Pt required multiple restroom breaks through session, limited time for balance and strenghtening activities.  Able  to add bridges for gluteal strengthening to HEP, pt able to demonstrate and verbalized understanding.    Personal Factors and Comorbidities Comorbidity 1;Comorbidity 3+;Comorbidity 2    Comorbidities CHF, left knee surgery, right chronic foot wound, DB, restless leg syndromes    Examination-Activity Limitations Bathing;Lift;Locomotion Level;Transfers;Stand;Stairs;Squat    Stability/Clinical Decision Making Evolving/Moderate complexity    Clinical Decision Making Moderate    Rehab Potential Fair    PT Frequency 2x / week    PT Duration 8 weeks    PT Treatment/Interventions ADLs/Self Care Home Management;Other (comment);Moist Heat;Balance training;Therapeutic exercise;Therapeutic activities;Functional mobility training;Stair training;Gait training;DME Instruction;Ultrasound;Neuromuscular re-education;Patient/family education;Orthotic Fit/Training;Manual techniques;Taping;Passive range of motion;Electrical Stimulation    PT Next Visit Plan Focus on gluteal and quad strengthening and balance.  continue debridment and dressing changes, balance, HEP development, knee extension .    PT Home Exercise Plan calf stretch with towel, bridges.           Patient will benefit from skilled therapeutic intervention in order to improve the following deficits and impairments:  Abnormal gait,Decreased endurance,Decreased skin integrity,Increased edema,Decreased activity tolerance,Decreased balance,Decreased mobility,Decreased knowledge of use of DME,Decreased strength,Difficulty walking,Pain,Decreased range of motion,Impaired flexibility  Visit Diagnosis: Diabetic ulcer of right midfoot associated with diabetes mellitus due to underlying condition, limited to breakdown of skin ([place])  Difficulty in walking, not elsewhere classified  Chronic pain of left knee     Problem List Patient Active Problem List   Diagnosis Date Noted  . Gait abnormality 09/30/2020  . Mild non proliferative diabetic retinopathy  ([place]) 07/16/2020  . Need for immunization against influenza 06/03/2020  . S/P left knee arthroscopy 05/06/20 05/13/2020  . Abnormal MRI 03/26/2020  . Joint disorder of knee 03/26/2020  . Fall at home, initial encounter 03/13/2020  . Anemia 09/03/2019  . Reflux esophagitis 09/03/2019  . Esophageal dysphagia 05/30/2019  . Constipation 05/30/2019  . Positive colorectal cancer screening using Cologuard test 05/30/2019  . Diabetic foot ulcer ([place]) 09/06/2018  . Congestive heart failure ([place]) 05/17/2016  . Bilateral carotid artery disease ([place]) 07/08/2014  . Obstructive sleep apnea 02/12/2014  . Restless leg syndrome 10/23/2013  . Overweight with body mass index (BMI) of 29 to 29.9 in adult 10/23/2013  . Hyperlipidemia 07/16/2013  . Peripheral arterial disease ([place]) 07/16/2013  . Hypertension 03/14/2012  . Type 2 diabetes mellitus with circulatory disorder ([place]) 03/14/2012  . Peripheral neuropathy 03/14/2012  . Cardiomyopathy- EF NL 01/2013, now 30-35% echo 03/14/2012   [name], LPTA/CLT; CBIS [phone]  [name] 10/22/2020, 1:44 PM  [place], [place], [address] Phone: [phone]   Fax:  [phone]  Name: [name] MRN: [id number] Date of Birth: [birth year]

## 2020-10-23 ENCOUNTER — Ambulatory Visit (HOSPITAL_COMMUNITY): Payer: HMO | Admitting: Physical Therapy

## 2020-10-23 DIAGNOSIS — E08621 Diabetes mellitus due to underlying condition with foot ulcer: Secondary | ICD-10-CM

## 2020-10-23 DIAGNOSIS — M25562 Pain in left knee: Secondary | ICD-10-CM

## 2020-10-23 DIAGNOSIS — R262 Difficulty in walking, not elsewhere classified: Secondary | ICD-10-CM

## 2020-10-23 DIAGNOSIS — L97411 Non-pressure chronic ulcer of right heel and midfoot limited to breakdown of skin: Secondary | ICD-10-CM

## 2020-10-23 DIAGNOSIS — G8929 Other chronic pain: Secondary | ICD-10-CM

## 2020-10-23 NOTE — Patient Instructions (Signed)
Bridge [employer] small of back into mat, maintain pelvic tilt, roll up one vertebrae at a time. Focus on engaging posterior hip muscles. Hold for _5_ seconds. Repeat _10_ times.    Straight Leg Raise    Tighten stomach and slowly raise locked right leg to knee level, return slowly.   Repeat _10_ times. Do _2__ sessions per day.   Abduction: Side Leg Lift - Side-Lying    Lie on side. Lift top leg slightly higher than shoulder level. Keep top leg straight with body, toes pointing forward. Slowly lower for 3-5 seconds. _10__ reps per set, _2__ sets per day   Extension    Lift leg up in the air and bring it back down slowly and controlled.  Repeat _10_ times. Do _2_ sessions per day.

## 2020-10-23 NOTE — Therapy (Signed)
[facility] [address] [place], [place], [address] Phone: [phone]   Fax:  [phone]  Physical Therapy Treatment  Patient Details  Name: [name] MRN: [id number] Date of Birth: November 24, 1945 Referring Provider (PT): Perlie Mayo   Encounter Date: 10/23/2020    Past Medical History:  Diagnosis Date  . Aftercare following surgery of the circulatory system, [place] 12/04/2013  . Angina decubitus ([place]) 05/19/2016  . Arrhythmia 05/20/2016  . Arthritis   . ARTHRITIS, RIGHT FOOT 06/26/2008   Qualifier: Diagnosis of  By: Aline Brochure MD, Dorothyann Peng    . Cardiomyopathy- EF NL 01/2013, now 30-35% echo 03/14/2012  . Carotid artery occlusion    left s/p CEA  . Cellulitis of left foot 05/07/2018  . CHF (congestive heart failure) ([place])   . Critical lower limb ischemia (HCC) 02/28/2014   Critical limb ischemia   . Diabetes mellitus   . Elevated troponin 04/11/2016  . GERD (gastroesophageal reflux disease)   . History of stroke June 2013 03/14/2012  . HOH (hard of hearing)   . Hypertension   . Hypothyroidism   . Nonhealing skin ulcer ([place]) 10/28/2014  . Obstructive sleep apnea   . Orthostatic hypotension   . Osteomyelitis ([place])    left great toe  . Pain in the chest 05/17/2016  . Peripheral arterial disease ([place]), s/p PTA x 2 RLE    nonhealing ulcers bilaterally on each great toe  . Pneumonia   . PONV (postoperative nausea and vomiting)   . Pulmonary nodule 05/17/2016  . Restless leg syndrome   . Shortness of breath   . Stroke [facility]) March 08, 2012  . Toe osteomyelitis, left ([place])   . Ulcer of great toe, left, with necrosis of bone ([place])   . Wears dentures     Past Surgical History:  Procedure Laterality Date  . AMPUTATION TOE Left 07/25/2018   Procedure: AMPUTATION TOE INTERPHALANGEAL HALLUX LEFT;  Surgeon: Evelina Bucy, DPM;  Location: [place];  Service: Podiatry;  Laterality: Left;  . ANGIOPLASTY  02/28/14   diamond back orbital rotational  atherectomy of Rt. tibial  . BACK SURGERY    . BONE BIOPSY Left 07/25/2018   Procedure: SUPERFICIAL BONE BIOPSY;  Surgeon: Evelina Bucy, DPM;  Location: [place];  Service: Podiatry;  Laterality: Left;  . CARDIAC CATHETERIZATION N/A 05/19/2016   Procedure: Right/Left Heart Cath and Coronary Angiography;  Surgeon: Belva Crome, MD;  Location: [facility];  Service: Cardiovascular;  Laterality: N/A;  . CERVICAL FUSION    . ENDARTERECTOMY Left 11/29/2013   Procedure: ENDARTERECTOMY CAROTID;  Surgeon: Serafina Mitchell, MD;  Location[facility];  Service: Vascular;  Laterality: Left;  . ESOPHAGOGASTRODUODENOSCOPY  02/2010   Dr. Gala Romney: patient presented with food impaction, schatzki ring with superimposed component of stricture with erosive reflux esophagitis, s/p disimpaction but dilation planned at later date   . ESOPHAGOGASTRODUODENOSCOPY (EGD) WITH PROPOFOL N/A 08/13/2019   Dr. Gala Romney: Erosive reflux esophagitis with mild stricture and incidental Mallory-Weiss tear which precluded esophageal dilation.  Medium sized hiatal hernia.  [name] ESOPHAGOGASTRODUODENOSCOPY (EGD) WITH PROPOFOL N/A 10/11/2019   Dr. Gala Romney: Esophageal stenosis status post dilation, moderate hiatal hernia  . EYE SURGERY    . FLEXIBLE SIGMOIDOSCOPY N/A 08/13/2019   Procedure: FLEXIBLE SIGMOIDOSCOPY;  Surgeon: [name], MD;  [facility];  Service: Endoscopy;  Laterality: N/A;  colonoscopy aboerted due to formed stool and poor prep  . FOOT SURGERY    .  KNEE ARTHROSCOPY WITH MEDIAL MENISECTOMY Left 05/06/2020   Procedure: KNEE ARTHROSCOPY WITH MEDIAL MENISCECTOMY AND LATERAL MENISCECTOMY;  Surgeon: [name], MD;  Location: [facility];  Service: Orthopedics;  Laterality: Left;  . Lower ext duplex doppler  03/14/14   Rt ABI 1.2  . LOWER EXTREMITY ANGIOGRAM Bilateral 02/18/2014   Procedure: LOWER EXTREMITY ANGIOGRAM;  Surgeon: [name], MD;  Location: [facility] CATH LAB;  Service: Cardiovascular;  Laterality:  Bilateral;  . LOWER EXTREMITY ANGIOGRAM N/A 10/31/2014   Procedure: LOWER EXTREMITY ANGIOGRAM;  Surgeon: [name], MD;  Location: [facility] CATH LAB;  Service: Cardiovascular;  Laterality: N/A;  . Venia Minks DILATION N/A 10/11/2019   Procedure: Venia Minks DILATION;  Surgeon: [name], MD;  [facility];  Service: Endoscopy;  Laterality: N/A;  . MULTIPLE TOOTH EXTRACTIONS    . PV angiogram  02/18/2014   tibial vessel diseas bil.  [name] SPINE SURGERY    . tendon achillies lengthing and sesamoid      There were no vitals filed for this visit.   Subjective Assessment - 10/23/20 1012    Subjective Pt reports no near falls or incidents.  Comes today with SPC.              [facility] PT Assessment - 10/23/20 0001      Strength   Right Hip Extension 3-/5    Right Hip ABduction 3-/5    Left Hip Extension 3-/5    Left Hip ABduction 3-/5      Ambulation/Gait   Ambulation/Gait Yes    Ambulation/Gait Assistance 4: Min assist    Ambulation Distance (Feet) 226 Feet   126x1, 100x1   Assistive device Straight cane    Gait Pattern Step-to pattern;Decreased weight shift to left;Decreased hip/knee flexion - left;Decreased hip/knee flexion - right;Scissoring;Shuffle    Ambulation Surface Level;Indoor    Gait velocity decreased                       Wound Therapy - 10/23/20 1136    Subjective no issues today    Patient and Family Stated Goals to have wound to heal and to be able to walk    Date of Onset --   4-5 years ago   Prior Treatments wound clinics, neosporin on bandaid but bandaid wound stick.    Evaluation and Treatment Procedures Explained to Patient/Family Yes    Evaluation and Treatment Procedures agreed to    Wound Properties Date First Assessed: 10/20/20 Time First Assessed: 1000 Wound Type: Diabetic ulcer Location: Foot Location Orientation: Anterior;Right Wound Description (Comments): bottom of foot Present on Admission: Yes   Dressing Type Compression wrap    medihoney   Dressing Changed Changed    Dressing Status Old drainage;Intact    Dressing Change Frequency PRN    Site / Wound Assessment Red;Pink    % Wound base Red or Granulating 100%    Peri-wound Assessment Intact    Drainage Amount Minimal    Drainage Description Serosanguineous    Treatment Debridement (Selective);Cleansed    Selective Debridement - Location right forefoot    Selective Debridement - Tools Used Forceps;Scalpel    Selective Debridement - Tissue Removed callus, devitalized tissue    Wound Therapy - Clinical Statement see below assessment    Wound Therapy - Functional Problem List difficulty walking, washing, dressing    Factors Delaying/Impairing Wound Healing Diabetes Mellitus;Immobility    Wound Plan debride callus, monitor for restless leg syndrom  [facility] Adult PT Treatment/Exercise - 10/23/20 0001      Knee/Hip Exercises: Aerobic   Nustep 8 minutes at level 4 LE"s only      Knee/Hip Exercises: Supine   Short Arc Quad Sets Both;10 reps    Short Arc [employer] Limitations 5 second holds    Bridges 2 sets;10 reps    Straight Leg Raises Both;10 reps      Knee/Hip Exercises: Sidelying   Hip ABduction Both;2 sets;10 reps    Clams 10 reps each 5" holds      Knee/Hip Exercises: Prone   Hamstring Curl 10 reps    Hip Extension Both;10 reps;2 sets                  PT Education - 10/23/20 1142    Education Details Reiterated importance of using RW at this time instead of cane due to weakness/shuffling of Rt foot irritating wound, fall risk.  Pt unable to use cane on Rt which would also help/improve gait.  Rt UE too weak and pt has been using on Lt for years.    Person(s) Educated Patient    Methods Explanation;Demonstration    Comprehension Verbalized understanding;Returned demonstration;Verbal cues required;Tactile cues required            PT Short Term Goals - 10/20/20 1153      PT SHORT TERM GOAL #1   Title Patient will be  independent in self management strategies to improve quality of life and functional outcomes.    Time 4    Period Weeks    Status New    Target Date 11/17/20      PT SHORT TERM GOAL #2   Title Patient will be able to report at least 25% improvement in overall mobility.    Time 4    Period Weeks    Status New    Target Date 11/17/20      PT SHORT TERM GOAL #3   Title Patient's wound will be reduced in volume by at least 25% to demonstrate improved wound healing    Baseline 2.3 cm^3    Time 4    Period Weeks    Status New    Target Date 11/17/20             PT Long Term Goals - 10/20/20 1154      PT LONG TERM GOAL #1   Title Patient will be able to ambulate at least 100 feet with RW in 2 minutes to demonstrate improved ambulatory endurance    Time 8    Period Weeks    Status New    Target Date 12/15/20      PT LONG TERM GOAL #2   Title Patient's wound will reduce in overall volume byt at least 50% to demonstrate improved wound healing    Baseline 2.3 cm^3    Time 8    Period Weeks    Status New    Target Date 12/15/20      PT LONG TERM GOAL #3   Title Patient will be able to demonstate 5-110 degrees in left knee to demonstrate improved knee mobility    Time 8    Period Weeks    Status New    Target Date 12/15/20                 Plan - 10/23/20 1154    Clinical Impression Statement Began session with ambulation; warm up on nustep per request using LE only  at level 4 for strengthening of LE's as well.  Attempted gait with SPC on Rt UE as pt with most weakness in Lt LE.  Pt unable to do this due to Rt UE weakness and has always used cane in his dominant Lt UE.  Reiterated importance of using RW at this time instead of cane due to weakness/shuffling of Rt foot irritating wound, fall risk.  Pt verbalized understanding.  Checked bil hip abd and ext strength with extreme weakness noted bilaterally.  Began exercises to challenge this with pt reporting he believes  these will help him a great deal.  Wound is looking better.  Able to debride more callous from perimeter.  Changed dressing to xerform only as too moist from drainage around perimeter.  Continued with profore lite.  Will give copies of therex next session (ran out of time).    Personal Factors and Comorbidities Comorbidity 1;Comorbidity 3+;Comorbidity 2    Comorbidities CHF, left knee surgery, right chronic foot wound, DB, restless leg syndromes    Examination-Activity Limitations Bathing;Lift;Locomotion Level;Transfers;Stand;Stairs;Squat    Stability/Clinical Decision Making Evolving/Moderate complexity    Rehab Potential Fair    PT Frequency 2x / week    PT Duration 8 weeks    PT Treatment/Interventions ADLs/Self Care Home Management;Other (comment);Moist Heat;Balance training;Therapeutic exercise;Therapeutic activities;Functional mobility training;Stair training;Gait training;DME Instruction;Ultrasound;Neuromuscular re-education;Patient/family education;Orthotic Fit/Training;Manual techniques;Taping;Passive range of motion;Electrical Stimulation    PT Next Visit Plan Focus on gluteal and quad strengthening and balance.  continue debridment and dressing changes, balance, HEP development, knee extension .  Give updated HEP next session    PT Home Exercise Plan calf stretch with towel, bridges.           Patient will benefit from skilled therapeutic intervention in order to improve the following deficits and impairments:  Abnormal gait,Decreased endurance,Decreased skin integrity,Increased edema,Decreased activity tolerance,Decreased balance,Decreased mobility,Decreased knowledge of use of DME,Decreased strength,Difficulty walking,Pain,Decreased range of motion,Impaired flexibility  Visit Diagnosis: Diabetic ulcer of right midfoot associated with diabetes mellitus due to underlying condition, limited to breakdown of skin ([place])  Difficulty in walking, not elsewhere classified  Chronic pain of  left knee     Problem List Patient Active Problem List   Diagnosis Date Noted  . Gait abnormality 09/30/2020  . Mild non proliferative diabetic retinopathy ([place]) 07/16/2020  . Need for immunization against influenza 06/03/2020  . S/P left knee arthroscopy 05/06/20 05/13/2020  . Abnormal MRI 03/26/2020  . Joint disorder of knee 03/26/2020  . Fall at home, initial encounter 03/13/2020  . Anemia 09/03/2019  . Reflux esophagitis 09/03/2019  . Esophageal dysphagia 05/30/2019  . Constipation 05/30/2019  . Positive colorectal cancer screening using Cologuard test 05/30/2019  . Diabetic foot ulcer ([place]) 09/06/2018  . Congestive heart failure ([place]) 05/17/2016  . Bilateral carotid artery disease ([place]) 07/08/2014  . Obstructive sleep apnea 02/12/2014  . Restless leg syndrome 10/23/2013  . Overweight with body mass index (BMI) of 29 to 29.9 in adult 10/23/2013  . Hyperlipidemia 07/16/2013  . Peripheral arterial disease ([place]) 07/16/2013  . Hypertension 03/14/2012  . Type 2 diabetes mellitus with circulatory disorder ([place]) 03/14/2012  . Peripheral neuropathy 03/14/2012  . Cardiomyopathy- EF NL 01/2013, now 30-35% echo 03/14/2012   Teena Irani, PTA/CLT [phone]  Teena Irani 10/23/2020, 12:43 PM  [facility] [address] [place], [place], [address] Phone: [phone]   Fax:  [phone]  Name: [name] MRN: [id number] Date of Birth: 01-16-1946

## 2020-10-28 ENCOUNTER — Ambulatory Visit (HOSPITAL_COMMUNITY): Payer: HMO | Admitting: Physical Therapy

## 2020-10-28 ENCOUNTER — Other Ambulatory Visit: Payer: Self-pay

## 2020-10-28 DIAGNOSIS — E08621 Diabetes mellitus due to underlying condition with foot ulcer: Secondary | ICD-10-CM | POA: Diagnosis not present

## 2020-10-28 DIAGNOSIS — L97411 Non-pressure chronic ulcer of right heel and midfoot limited to breakdown of skin: Secondary | ICD-10-CM

## 2020-10-28 DIAGNOSIS — M25562 Pain in left knee: Secondary | ICD-10-CM

## 2020-10-28 DIAGNOSIS — G8929 Other chronic pain: Secondary | ICD-10-CM

## 2020-10-28 DIAGNOSIS — R262 Difficulty in walking, not elsewhere classified: Secondary | ICD-10-CM

## 2020-10-28 NOTE — Therapy (Signed)
[facility] [address] [place], [place], [address] Phone: [phone]   Fax:  [phone]  Physical Therapy Treatment  Patient Details  Name: [name] MRN: [id number] Date of Birth: April 09, 1946 Referring Provider (PT): Perlie Mayo   Encounter Date: 10/28/2020   PT End of Session - 10/28/20 1255    Visit Number 3    Number of Visits 16    Date for PT Re-Evaluation 12/15/20    Authorization Type [employer], no VL, no auth , $15 co pay    Progress Note Due on Visit 10    PT Start Time 1005    PT Stop Time 1135    PT Time Calculation (min) 90 min    Equipment Utilized During Treatment Gait belt    Activity Tolerance Patient tolerated treatment well;Patient limited by fatigue    Behavior During Therapy [facility] for tasks assessed/performed           Past Medical History:  Diagnosis Date  . Aftercare following surgery of the circulatory system, [place] 12/04/2013  . Angina decubitus ([place]) 05/19/2016  . Arrhythmia 05/20/2016  . Arthritis   . ARTHRITIS, RIGHT FOOT 06/26/2008   Qualifier: Diagnosis of  By: Aline Brochure MD, Dorothyann Peng    . Cardiomyopathy- EF NL 01/2013, now 30-35% echo 03/14/2012  . Carotid artery occlusion    left s/p CEA  . Cellulitis of left foot 05/07/2018  . CHF (congestive heart failure) ([place])   . Critical lower limb ischemia (HCC) 02/28/2014   Critical limb ischemia   . Diabetes mellitus   . Elevated troponin 04/11/2016  . GERD (gastroesophageal reflux disease)   . History of stroke June 2013 03/14/2012  . HOH (hard of hearing)   . Hypertension   . Hypothyroidism   . Nonhealing skin ulcer ([place]) 10/28/2014  . Obstructive sleep apnea   . Orthostatic hypotension   . Osteomyelitis ([place])    left great toe  . Pain in the chest 05/17/2016  . Peripheral arterial disease ([place]), s/p PTA x 2 RLE    nonhealing ulcers bilaterally on each great toe  . Pneumonia   . PONV (postoperative nausea and vomiting)   . Pulmonary  nodule 05/17/2016  . Restless leg syndrome   . Shortness of breath   . Stroke [facility]) March 08, 2012  . Toe osteomyelitis, left ([place])   . Ulcer of great toe, left, with necrosis of bone ([place])   . Wears dentures     Past Surgical History:  Procedure Laterality Date  . AMPUTATION TOE Left 07/25/2018   Procedure: AMPUTATION TOE INTERPHALANGEAL HALLUX LEFT;  Surgeon: Evelina Bucy, DPM;  Location: [facility];  Service: Podiatry;  Laterality: Left;  . ANGIOPLASTY  02/28/14   diamond back orbital rotational atherectomy of Rt. tibial  . BACK SURGERY    . BONE BIOPSY Left 07/25/2018   Procedure: SUPERFICIAL BONE BIOPSY;  Surgeon: Evelina Bucy, DPM;  Location: [facility];  Service: Podiatry;  Laterality: Left;  . CARDIAC CATHETERIZATION N/A 05/19/2016   Procedure: Right/Left Heart Cath and Coronary Angiography;  Surgeon: Belva Crome, MD;  Location: [facility] CV LAB;  Service: Cardiovascular;  Laterality: N/A;  . CERVICAL FUSION    . ENDARTERECTOMY Left 11/29/2013   Procedure: ENDARTERECTOMY CAROTID;  Surgeon: Serafina Mitchell, MD;  Location: [facility];  Service: Vascular;  Laterality: Left;  . ESOPHAGOGASTRODUODENOSCOPY  02/2010   Dr. Gala Romney: patient presented with food impaction, schatzki ring with superimposed component of stricture  with erosive reflux esophagitis, s/p disimpaction but dilation planned at later date   . ESOPHAGOGASTRODUODENOSCOPY (EGD) WITH PROPOFOL N/A 08/13/2019   Dr. Gala Romney: Erosive reflux esophagitis with mild stricture and incidental Mallory-Weiss tear which precluded esophageal dilation.  Medium sized hiatal hernia.  [name] ESOPHAGOGASTRODUODENOSCOPY (EGD) WITH PROPOFOL N/A 10/11/2019   Dr. Gala Romney: Esophageal stenosis status post dilation, moderate hiatal hernia  . EYE SURGERY    . FLEXIBLE SIGMOIDOSCOPY N/A 08/13/2019   Procedure: FLEXIBLE SIGMOIDOSCOPY;  Surgeon: [name], MD;  [facility];  Service: Endoscopy;  Laterality: N/A;  colonoscopy aboerted due to formed stool  and poor prep  . FOOT SURGERY    . KNEE ARTHROSCOPY WITH MEDIAL MENISECTOMY Left 05/06/2020   Procedure: KNEE ARTHROSCOPY WITH MEDIAL MENISCECTOMY AND LATERAL MENISCECTOMY;  Surgeon: [name], MD;  Location: [facility];  Service: Orthopedics;  Laterality: Left;  . Lower ext duplex doppler  03/14/14   Rt ABI 1.2  . LOWER EXTREMITY ANGIOGRAM Bilateral 02/18/2014   Procedure: LOWER EXTREMITY ANGIOGRAM;  Surgeon: [name], MD;  Location: [facility] CATH LAB;  Service: Cardiovascular;  Laterality: Bilateral;  . LOWER EXTREMITY ANGIOGRAM N/A 10/31/2014   Procedure: LOWER EXTREMITY ANGIOGRAM;  Surgeon: [name], MD;  Location: [facility] C[facility] LAB;  Service: Cardiovascular;  Laterality: N/A;  . Venia Minks DILATION N/A 10/11/2019   Procedure: Venia Minks DILATION;  Surgeon: [name], MD;  [facility];  Service: Endoscopy;  Laterality: N/A;  . MULTIPLE TOOTH EXTRACTIONS    . PV angiogram  02/18/2014   tibial vessel diseas bil.  [name] SPINE SURGERY    . tendon achillies lengthing and sesamoid      There were no vitals filed for this visit.   Subjective Assessment - 10/28/20 1238    Subjective Pt states he intends on going back to the [facility] today and begin strength training for his upper and lower body.  Comes today using his rollator.  No pain or issues today.    Currently in Pain? No/denies                           Wound Therapy - 10/28/20 0001    Wound Properties Date First Assessed: 10/20/20 Time First Assessed: 1000 Wound Type: Diabetic ulcer Location: Foot Location Orientation: Anterior;Right Wound Description (Comments): bottom of foot Present on Admission: Yes   Wound Image View All Images View Images    Dressing Type Compression wrap    Dressing Changed Changed    Dressing Status Old drainage    Dressing Change Frequency PRN    Site / Wound Assessment Yellow;Red;Pink    % Wound base Red or Granulating 100%    Peri-wound Assessment Maceration   calloused    Wound Length (cm) 1.2 cm   was 1.6cm on 1/31   Wound Width (cm) 1.1 cm   was 1.8cm 1/31   Wound Depth (cm) 0.3 cm   was 0.8cm 1/31   Wound Volume (cm^3) 0.4 cm^3    Wound Surface Area (cm^2) 1.32 cm^2    Undermining (cm) none present today    Margins Attached edges (approximated)    Drainage Amount Scant    Drainage Description Serosanguineous    Treatment Debridement (Selective);Cleansed    Selective Debridement - Location right forefoot    Selective Debridement - Tools Used Forceps;Scalpel    Selective Debridement - Tissue Removed callus, devitalized tissue    Wound Therapy - Clinical Statement see below  assessment    Wound Therapy - Functional Problem List difficulty walking, washing, dressing    Factors Delaying/Impairing Wound Healing Diabetes Mellitus;Immobility    Wound Plan continue to cleanse and debride to improve approximation and healing; change dressings as needed.    Dressing  xeroform, 2X2, medipore, conform 3" and #5 netting            [facility] Adult PT Treatment/Exercise - 10/28/20 0001      Ambulation/Gait   Ambulation/Gait Yes    Ambulation/Gait Assistance 5: Supervision    Ambulation Distance (Feet) 226 Feet    Assistive device Rollator    Gait Pattern Step-to pattern;Decreased weight shift to left;Decreased hip/knee flexion - left;Decreased hip/knee flexion - right;Scissoring;Shuffle    Ambulation Surface Level;Indoor    Gait velocity decreased    Gait Comments cues for posturing, keeping feet wider apart and no shuffling      Exercises   Exercises Knee/Hip      Knee/Hip Exercises: Supine   Bridges 2 sets;10 reps    Bridges Limitations 5" holds    Straight Leg Raises Both;2 sets;10 reps      Knee/Hip Exercises: Sidelying   Hip ABduction Both;2 sets;10 reps    Hip ABduction Limitations Lt LE modified with clams      Knee/Hip Exercises: Prone   Hip Extension Both;10 reps;2 sets    Hip Extension Limitations Rt LE modified with knee bent                     PT Short Term Goals - 10/20/20 1153      PT SHORT TERM GOAL #1   Title Patient will be independent in self management strategies to improve quality of life and functional outcomes.    Time 4    Period Weeks    Status New    Target Date 11/17/20      PT SHORT TERM GOAL #2   Title Patient will be able to report at least 25% improvement in overall mobility.    Time 4    Period Weeks    Status New    Target Date 11/17/20      PT SHORT TERM GOAL #3   Title Patient's wound will be reduced in volume by at least 25% to demonstrate improved wound healing    Baseline 2.3 cm^3    Time 4    Period Weeks    Status New    Target Date 11/17/20             PT Long Term Goals - 10/20/20 1154      PT LONG TERM GOAL #1   Title Patient will be able to ambulate at least 100 feet with RW in 2 minutes to demonstrate improved ambulatory endurance    Time 8    Period Weeks    Status New    Target Date 12/15/20      PT LONG TERM GOAL #2   Title Patient's wound will reduce in overall volume byt at least 50% to demonstrate improved wound healing    Baseline 2.3 cm^3    Time 8    Period Weeks    Status New    Target Date 12/15/20      PT LONG TERM GOAL #3   Title Patient will be able to demonstate 5-110 degrees in left knee to demonstrate improved knee mobility    Time 8    Period Weeks    Status New    Target  Date 12/15/20                 Plan - 10/28/20 1301    Clinical Impression Statement Pt returned today with rollator; states he doesn't like using this due to it is heavy and hard to get in/out of car.  Worked on ambulation safety and form as pt tends to keep elbows extended and walker too far out in front.  Also, pt scissors and shuffles LE needing cues to correct.   Pt given written HEP this session with most difficulty completing Lt hip abduction (modified with clam holds), Rt hip extension (modified with bent knee) and transitioning into prone  position.  Pt reports he is going to get an exercise mat for his floor (states he can get up/down without difficulty) to have a harder surface.  Pt without any questions regarding exercises.  Wound remeasured this session with noted improvement; 0.4cm reduction in length, 0.7cm reduction in width and 0.5cm reduction in depth.  Also noted increased approximation of wound borders.  Continued need for debridement of edges and callous.  Changed dressing this session to simplify with only conform, gauze and medipore tape.  #5 netting used to secure.  Will check to ensure this dressing is adequate and edema does not return next session.    Personal Factors and Comorbidities Comorbidity 1;Comorbidity 3+;Comorbidity 2    Comorbidities CHF, left knee surgery, right chronic foot wound, DB, restless leg syndromes    Examination-Activity Limitations Bathing;Lift;Locomotion Level;Transfers;Stand;Stairs;Squat    Stability/Clinical Decision Making Evolving/Moderate complexity    Rehab Potential Fair    PT Frequency 2x / week    PT Duration 8 weeks    PT Treatment/Interventions ADLs/Self Care Home Management;Other (comment);Moist Heat;Balance training;Therapeutic exercise;Therapeutic activities;Functional mobility training;Stair training;Gait training;DME Instruction;Ultrasound;Neuromuscular re-education;Patient/family education;Orthotic Fit/Training;Manual techniques;Taping;Passive range of motion;Electrical Stimulation    PT Next Visit Plan Focus on gluteal and quad strengthening and balance.  continue debridment and dressing changes, balance, HEP development, knee extension .  Give updated HEP as needed. change dressings as needed to promote healing environment.    PT Home Exercise Plan calf stretch with towel, bridges.  2/8: bridge, SLR, hip abduction, hip extension           Patient will benefit from skilled therapeutic intervention in order to improve the following deficits and impairments:  Abnormal  gait,Decreased endurance,Decreased skin integrity,Increased edema,Decreased activity tolerance,Decreased balance,Decreased mobility,Decreased knowledge of use of DME,Decreased strength,Difficulty walking,Pain,Decreased range of motion,Impaired flexibility  Visit Diagnosis: Difficulty in walking, not elsewhere classified  Diabetic ulcer of right midfoot associated with diabetes mellitus due to underlying condition, limited to breakdown of skin (HCC)  Chronic pain of left knee     Problem List Patient Active Problem List   Diagnosis Date Noted  . Gait abnormality 09/30/2020  . Mild non proliferative diabetic retinopathy ([place]) 07/16/2020  . Need for immunization against influenza 06/03/2020  . S/P left knee arthroscopy 05/06/20 05/13/2020  . Abnormal MRI 03/26/2020  . Joint disorder of knee 03/26/2020  . Fall at home, initial encounter 03/13/2020  . Anemia 09/03/2019  . Reflux esophagitis 09/03/2019  . Esophageal dysphagia 05/30/2019  . Constipation 05/30/2019  . Positive colorectal cancer screening using Cologuard test 05/30/2019  . Diabetic foot ulcer ([place]) 09/06/2018  . Congestive heart failure ([place]) 05/17/2016  . Bilateral carotid artery disease ([place]) 07/08/2014  . Obstructive sleep apnea 02/12/2014  . Restless leg syndrome 10/23/2013  . Overweight with body mass index (BMI) of 29 to 29.9 in adult 10/23/2013  .  Hyperlipidemia 07/16/2013  . Peripheral arterial disease ([place]) 07/16/2013  . Hypertension 03/14/2012  . Type 2 diabetes mellitus with circulatory disorder ([place]) 03/14/2012  . Peripheral neuropathy 03/14/2012  . Cardiomyopathy- EF NL 01/2013, now 30-35% echo 03/14/2012   Teena Irani, PTA/CLT [phone]  Teena Irani 10/28/2020, 1:05 PM  [facility] [address] [place], [place], [address] Phone: [phone]   Fax:  [phone]  Name: [name] MRN: [id number] Date of Birth: 09/03/1946

## 2020-10-30 ENCOUNTER — Ambulatory Visit (HOSPITAL_COMMUNITY): Payer: HMO | Admitting: Physical Therapy

## 2020-10-30 ENCOUNTER — Other Ambulatory Visit: Payer: Self-pay

## 2020-10-30 DIAGNOSIS — M25562 Pain in left knee: Secondary | ICD-10-CM

## 2020-10-30 DIAGNOSIS — G8929 Other chronic pain: Secondary | ICD-10-CM

## 2020-10-30 DIAGNOSIS — E08621 Diabetes mellitus due to underlying condition with foot ulcer: Secondary | ICD-10-CM | POA: Diagnosis not present

## 2020-10-30 DIAGNOSIS — R262 Difficulty in walking, not elsewhere classified: Secondary | ICD-10-CM

## 2020-10-30 DIAGNOSIS — L97411 Non-pressure chronic ulcer of right heel and midfoot limited to breakdown of skin: Secondary | ICD-10-CM

## 2020-10-30 NOTE — Patient Instructions (Signed)
Functional Quadriceps: Sit to Stand    Sit on edge of chair, feet flat on floor. Stand upright, extending knees fully. Repeat _10__ times per set. Do __2__ sets per session. Do _2___ sessions per day.  http://orth.exer.us/734   Copyright  VHI. All rights reserved.

## 2020-10-30 NOTE — Therapy (Signed)
[place], [place], [address] Phone: [phone]   Fax:  [phone]  Physical Therapy Treatment  Patient Details  Name: [name] MRN: [id number] Date of Birth: 11/09/1945 Referring Provider (PT): Perlie Mayo   Encounter Date: 10/30/2020   PT End of Session - 10/30/20 1118    Visit Number 4    Number of Visits 16    Date for PT Re-Evaluation 12/15/20    Authorization Type [employer], no VL, no auth , $15 co pay    Progress Note Due on Visit 10    PT Start Time 1005    PT Stop Time 1100    PT Time Calculation (min) 55 min    Equipment Utilized During Treatment Gait belt    Activity Tolerance Patient tolerated treatment well;Patient limited by fatigue    Behavior During Therapy [facility] for tasks assessed/performed           Past Medical History:  Diagnosis Date  . Aftercare following surgery of the circulatory system, [place] 12/04/2013  . Angina decubitus ([place]) 05/19/2016  . Arrhythmia 05/20/2016  . Arthritis   . ARTHRITIS, RIGHT FOOT 06/26/2008   Qualifier: Diagnosis of  By: Aline Brochure MD, Dorothyann Peng    . Cardiomyopathy- EF NL 01/2013, now 30-35% echo 03/14/2012  . Carotid artery occlusion    left s/p CEA  . Cellulitis of left foot 05/07/2018  . CHF (congestive heart failure) ([place])   . Critical lower limb ischemia (HCC) 02/28/2014   Critical limb ischemia   . Diabetes mellitus   . Elevated troponin 04/11/2016  . GERD (gastroesophageal reflux disease)   . History of stroke June 2013 03/14/2012  . HOH (hard of hearing)   . Hypertension   . Hypothyroidism   . Nonhealing skin ulcer ([place]) 10/28/2014  . Obstructive sleep apnea   . Orthostatic hypotension   . Osteomyelitis ([place])    left great toe  . Pain in the chest 05/17/2016  . Peripheral arterial disease ([place]), s/p PTA x 2 RLE    nonhealing ulcers bilaterally on each great toe  . Pneumonia   . PONV (postoperative nausea and vomiting)   . Pulmonary  nodule 05/17/2016  . Restless leg syndrome   . Shortness of breath   . Stroke [facility] March 08, 2012  . Toe osteomyelitis, left ([place])   . Ulcer of great toe, left, with necrosis of bone ([place])   . Wears dentures     Past Surgical History:  Procedure Laterality Date  . AMPUTATION TOE Left 07/25/2018   Procedure: AMPUTATION TOE INTERPHALANGEAL HALLUX LEFT;  Surgeon: Evelina Bucy, DPM;  Location: [facility];  Service: Podiatry;  Laterality: Left;  . ANGIOPLASTY  02/28/14   diamond back orbital rotational atherectomy of Rt. tibial  . BACK SURGERY    . BONE BIOPSY Left 07/25/2018   Procedure: SUPERFICIAL BONE BIOPSY;  Surgeon: Evelina Bucy, DPM;  Location: [facility];  Service: Podiatry;  Laterality: Left;  . CARDIAC CATHETERIZATION N/A 05/19/2016   Procedure: Right/Left Heart Cath and Coronary Angiography;  Surgeon: Belva Crome, MD;  Location: [facility];  Service: Cardiovascular;  Laterality: N/A;  . CERVICAL FUSION    . ENDARTERECTOMY Left 11/29/2013   Procedure: ENDARTERECTOMY CAROTID;  Surgeon: Serafina Mitchell, MD;  Location: [place] [facility];  Service: Vascular;  Laterality: Left;  . ESOPHAGOGASTRODUODENOSCOPY  02/2010   Dr. Gala Romney: patient presented with food impaction, schatzki ring with superimposed component of stricture  with erosive reflux esophagitis, s/p disimpaction but dilation planned at later date   . ESOPHAGOGASTRODUODENOSCOPY (EGD) WITH PROPOFOL N/A 08/13/2019   Dr. Gala Romney: Erosive reflux esophagitis with mild stricture and incidental Mallory-Weiss tear which precluded esophageal dilation.  Medium sized hiatal hernia.  [name] ESOPHAGOGASTRODUODENOSCOPY (EGD) WITH PROPOFOL N/A 10/11/2019   Dr. Gala Romney: Esophageal stenosis status post dilation, moderate hiatal hernia  . EYE SURGERY    . FLEXIBLE SIGMOIDOSCOPY N/A 08/13/2019   Procedure: FLEXIBLE SIGMOIDOSCOPY;  Surgeon: [name], MD;  [facility];  Service: Endoscopy;  Laterality: N/A;  colonoscopy aboerted due to formed stool  and poor prep  . FOOT SURGERY    . KNEE ARTHROSCOPY WITH MEDIAL MENISECTOMY Left 05/06/2020   Procedure: KNEE ARTHROSCOPY WITH MEDIAL MENISCECTOMY AND LATERAL MENISCECTOMY;  Surgeon: [name], MD;  Location: [facility];  Service: Orthopedics;  Laterality: Left;  . Lower ext duplex doppler  03/14/14   Rt ABI 1.2  . LOWER EXTREMITY ANGIOGRAM Bilateral 02/18/2014   Procedure: LOWER EXTREMITY ANGIOGRAM;  Surgeon: [name], MD;  Location: [facility] C[facility];  Service: Cardiovascular;  Laterality: Bilateral;  . LOWER EXTREMITY ANGIOGRAM N/A 10/31/2014   Procedure: LOWER EXTREMITY ANGIOGRAM;  Surgeon: [name], MD;  Location: [facility] C[facility];  Service: Cardiovascular;  Laterality: N/A;  . Venia Minks DILATION N/A 10/11/2019   Procedure: Venia Minks DILATION;  Surgeon: [name], MD;  [facility];  Service: Endoscopy;  Laterality: N/A;  . MULTIPLE TOOTH EXTRACTIONS    . PV angiogram  02/18/2014   tibial vessel diseas bil.  [name] SPINE SURGERY    . tendon achillies lengthing and sesamoid      There were no vitals filed for this visit.   Subjective Assessment - 10/30/20 1115    Subjective pt states he ordered his mat and a roll for under his knees.  Reports no issues today.    Currently in Pain? No/denies                           Wound Therapy - 10/30/20 0001    Wound Properties Date First Assessed: 10/20/20 Time First Assessed: 1000 Wound Type: Diabetic ulcer Location: Foot Location Orientation: Anterior;Right Wound Description (Comments): bottom of foot Present on Admission: Yes   Dressing Type Compression wrap    Dressing Changed Changed    Dressing Status Old drainage    Dressing Change Frequency Every 5 days    Site / Wound Assessment Yellow;Red;Pink    % Wound base Red or Granulating 100%    Peri-wound Assessment Maceration    Undermining (cm) none today    Margins Attached edges (approximated)    Drainage Amount Scant    Drainage Description  Serosanguineous    Treatment Cleansed;Debridement (Selective)    Selective Debridement - Location right forefoot    Selective Debridement - Tools Used Forceps;Scalpel    Selective Debridement - Tissue Removed callus, devitalized tissue    Wound Therapy - Clinical Statement see below assessment    Wound Therapy - Functional Problem List difficulty walking, washing, dressing    Factors Delaying/Impairing Wound Healing Diabetes Mellitus;Immobility    Wound Plan continue to cleanse and debride to improve approximation and healing; change dressings as needed.    Dressing  xeroform, 2X2, medipore, conform 3" and #5 netting            [facility] Adult PT Treatment/Exercise - 10/30/20 0001      Ambulation/Gait  Ambulation/Gait Yes    Ambulation/Gait Assistance 5: Supervision    Ambulation Distance (Feet) 226 Feet    Assistive device Rollator    Gait Pattern Step-to pattern;Decreased weight shift to left;Decreased hip/knee flexion - left;Decreased hip/knee flexion - right;Scissoring;Shuffle    Ambulation Surface Level    Gait Comments cues for posturing, keeping feet wider apart and no shuffling      Knee/Hip Exercises: Seated   Sit to Sand 2 sets;10 reps;without UE support      Knee/Hip Exercises: Supine   Short Arc Quad Sets Both;10 reps;2 sets    Short Arc Quad Sets Limitations 5" holds    Bridges 2 sets;10 reps    Bridges Limitations 5" holds    Straight Leg Raises Both;2 sets;10 reps      Knee/Hip Exercises: Sidelying   Hip ABduction Both;2 sets;10 reps    Hip ABduction Limitations Lt LE modified with clams      Knee/Hip Exercises: Prone   Hamstring Curl 2 sets;10 reps    Hip Extension Both;10 reps;2 sets                    PT Short Term Goals - 10/20/20 1153      PT SHORT TERM GOAL #1   Title Patient will be independent in self management strategies to improve quality of life and functional outcomes.    Time 4    Period Weeks    Status New    Target Date  11/17/20      PT SHORT TERM GOAL #2   Title Patient will be able to report at least 25% improvement in overall mobility.    Time 4    Period Weeks    Status New    Target Date 11/17/20      PT SHORT TERM GOAL #3   Title Patient's wound will be reduced in volume by at least 25% to demonstrate improved wound healing    Baseline 2.3 cm^3    Time 4    Period Weeks    Status New    Target Date 11/17/20             PT Long Term Goals - 10/20/20 1154      PT LONG TERM GOAL #1   Title Patient will be able to ambulate at least 100 feet with RW in 2 minutes to demonstrate improved ambulatory endurance    Time 8    Period Weeks    Status New    Target Date 12/15/20      PT LONG TERM GOAL #2   Title Patient's wound will reduce in overall volume byt at least 50% to demonstrate improved wound healing    Baseline 2.3 cm^3    Time 8    Period Weeks    Status New    Target Date 12/15/20      PT LONG TERM GOAL #3   Title Patient will be able to demonstate 5-110 degrees in left knee to demonstrate improved knee mobility    Time 8    Period Weeks    Status New    Target Date 12/15/20                 Plan - 10/30/20 1119    Clinical Impression Statement Pt contiues to use rollator regularly.  Noted improvement in gait with less cues today.  Noted improvement in strength as well with ability to complete Rt hip extension with knee straight and began sit  to stands from standard height without use of UE's.  Less debridement needed today of wound as it continues to approximate well and maintain healing environment.  New wrap worked well and did not roll back or cause any compression areas.  Pt pleased with ability to come to standing without UE assist and is being compliant with HEP.    Personal Factors and Comorbidities Comorbidity 1;Comorbidity 3+;Comorbidity 2    Comorbidities CHF, left knee surgery, right chronic foot wound, DB, restless leg syndromes    Examination-Activity  Limitations Bathing;Lift;Locomotion Level;Transfers;Stand;Stairs;Squat    Stability/Clinical Decision Making Evolving/Moderate complexity    Rehab Potential Fair    PT Frequency 2x / week    PT Duration 8 weeks    PT Treatment/Interventions ADLs/Self Care Home Management;Other (comment);Moist Heat;Balance training;Therapeutic exercise;Therapeutic activities;Functional mobility training;Stair training;Gait training;DME Instruction;Ultrasound;Neuromuscular re-education;Patient/family education;Orthotic Fit/Training;Manual techniques;Taping;Passive range of motion;Electrical Stimulation    PT Next Visit Plan Progress LE strength and begin to work on stability as able. Continue with debridment and dressing changes. Update HEP as needed.  Give written sit to stand instructions next visit.    PT Home Exercise Plan calf stretch with towel, bridges.  2/8: bridge, SLR, hip abduction, hip extension   2/10:  sit to stands           Patient will benefit from skilled therapeutic intervention in order to improve the following deficits and impairments:  Abnormal gait,Decreased endurance,Decreased skin integrity,Increased edema,Decreased activity tolerance,Decreased balance,Decreased mobility,Decreased knowledge of use of DME,Decreased strength,Difficulty walking,Pain,Decreased range of motion,Impaired flexibility  Visit Diagnosis: Difficulty in walking, not elsewhere classified  Diabetic ulcer of right midfoot associated with diabetes mellitus due to underlying condition, limited to breakdown of skin (HCC)  Chronic pain of left knee     Problem List Patient Active Problem List   Diagnosis Date Noted  . Gait abnormality 09/30/2020  . Mild non proliferative diabetic retinopathy ([place]) 07/16/2020  . Need for immunization against influenza 06/03/2020  . S/P left knee arthroscopy 05/06/20 05/13/2020  . Abnormal MRI 03/26/2020  . Joint disorder of knee 03/26/2020  . Fall at home, initial encounter  03/13/2020  . Anemia 09/03/2019  . Reflux esophagitis 09/03/2019  . Esophageal dysphagia 05/30/2019  . Constipation 05/30/2019  . Positive colorectal cancer screening using Cologuard test 05/30/2019  . Diabetic foot ulcer ([place]) 09/06/2018  . Congestive heart failure ([place]) 05/17/2016  . Bilateral carotid artery disease ([place]) 07/08/2014  . Obstructive sleep apnea 02/12/2014  . Restless leg syndrome 10/23/2013  . Overweight with body mass index (BMI) of 29 to 29.9 in adult 10/23/2013  . Hyperlipidemia 07/16/2013  . Peripheral arterial disease ([place]) 07/16/2013  . Hypertension 03/14/2012  . Type 2 diabetes mellitus with circulatory disorder ([place]) 03/14/2012  . Peripheral neuropathy 03/14/2012  . Cardiomyopathy- EF NL 01/2013, now 30-35% echo 03/14/2012   Teena Irani, PTA/CLT [phone]  Teena Irani 10/30/2020, 11:41 AM  [facility] [address] [place], [place], [address] Phone: [phone]   Fax:  [phone]  Name: [name] MRN: [id number] Date of Birth: 05-02-1946

## 2020-10-31 ENCOUNTER — Ambulatory Visit (HOSPITAL_COMMUNITY): Payer: HMO | Admitting: Physical Therapy

## 2020-11-04 ENCOUNTER — Ambulatory Visit (HOSPITAL_COMMUNITY): Payer: HMO | Admitting: Physical Therapy

## 2020-11-04 ENCOUNTER — Other Ambulatory Visit: Payer: Self-pay

## 2020-11-04 DIAGNOSIS — E08621 Diabetes mellitus due to underlying condition with foot ulcer: Secondary | ICD-10-CM | POA: Diagnosis not present

## 2020-11-04 DIAGNOSIS — L97411 Non-pressure chronic ulcer of right heel and midfoot limited to breakdown of skin: Secondary | ICD-10-CM

## 2020-11-04 DIAGNOSIS — M25562 Pain in left knee: Secondary | ICD-10-CM

## 2020-11-04 DIAGNOSIS — R262 Difficulty in walking, not elsewhere classified: Secondary | ICD-10-CM

## 2020-11-04 DIAGNOSIS — G8929 Other chronic pain: Secondary | ICD-10-CM

## 2020-11-04 NOTE — Therapy (Signed)
[place], [place], [address] Phone: [phone]   Fax:  [phone]  Physical Therapy Treatment  Patient Details  Name: [name] MRN: [id number] Date of Birth: 06/23/1946 Referring Provider (PT): Perlie Mayo   Encounter Date: 11/04/2020   PT End of Session - 11/04/20 1039    Visit Number 5    Number of Visits 16    Date for PT Re-Evaluation 12/15/20    Authorization Type [employer], no VL, no auth , $15 co pay    Progress Note Due on Visit 10    PT Start Time 0833    PT Stop Time 0945    PT Time Calculation (min) 72 min    Equipment Utilized During Treatment Gait belt    Activity Tolerance Patient tolerated treatment well;Patient limited by fatigue    Behavior During Therapy [facility] for tasks assessed/performed           Past Medical History:  Diagnosis Date  . Aftercare following surgery of the circulatory system, [place] 12/04/2013  . Angina decubitus ([place]) 05/19/2016  . Arrhythmia 05/20/2016  . Arthritis   . ARTHRITIS, RIGHT FOOT 06/26/2008   Qualifier: Diagnosis of  By: Aline Brochure MD, Dorothyann Peng    . Cardiomyopathy- EF NL 01/2013, now 30-35% echo 03/14/2012  . Carotid artery occlusion    left s/p CEA  . Cellulitis of left foot 05/07/2018  . CHF (congestive heart failure) ([place])   . Critical lower limb ischemia (HCC) 02/28/2014   Critical limb ischemia   . Diabetes mellitus   . Elevated troponin 04/11/2016  . GERD (gastroesophageal reflux disease)   . History of stroke June 2013 03/14/2012  . HOH (hard of hearing)   . Hypertension   . Hypothyroidism   . Nonhealing skin ulcer ([place]) 10/28/2014  . Obstructive sleep apnea   . Orthostatic hypotension   . Osteomyelitis ([place])    left great toe  . Pain in the chest 05/17/2016  . Peripheral arterial disease ([place]), s/p PTA x 2 RLE    nonhealing ulcers bilaterally on each great toe  . Pneumonia   . PONV (postoperative nausea and vomiting)   . Pulmonary  nodule 05/17/2016  . Restless leg syndrome   . Shortness of breath   . Stroke [facility]) March 08, 2012  . Toe osteomyelitis, left ([place])   . Ulcer of great toe, left, with necrosis of bone ([place])   . Wears dentures     Past Surgical History:  Procedure Laterality Date  . AMPUTATION TOE Left 07/25/2018   Procedure: AMPUTATION TOE INTERPHALANGEAL HALLUX LEFT;  Surgeon: Evelina Bucy, DPM;  Location: [place];  Service: Podiatry;  Laterality: Left;  . ANGIOPLASTY  02/28/14   diamond back orbital rotational atherectomy of Rt. tibial  . BACK SURGERY    . BONE BIOPSY Left 07/25/2018   Procedure: SUPERFICIAL BONE BIOPSY;  Surgeon: Evelina Bucy, DPM;  Location: [place];  Service: Podiatry;  Laterality: Left;  . CARDIAC CATHETERIZATION N/A 05/19/2016   Procedure: Right/Left Heart Cath and Coronary Angiography;  Surgeon: Belva Crome, MD;  Location: [place] [facility];  Service: Cardiovascular;  Laterality: N/A;  . CERVICAL FUSION    . ENDARTERECTOMY Left 11/29/2013   Procedure: ENDARTERECTOMY CAROTID;  Surgeon: Serafina Mitchell, MD;  Location: [place];  Service: Vascular;  Laterality: Left;  . ESOPHAGOGASTRODUODENOSCOPY  02/2010   Dr. Gala Romney: patient presented with food impaction, schatzki ring with superimposed component of stricture  with erosive reflux esophagitis, s/p disimpaction but dilation planned at later date   . ESOPHAGOGASTRODUODENOSCOPY (EGD) WITH PROPOFOL N/A 08/13/2019   Dr. Gala Romney: Erosive reflux esophagitis with mild stricture and incidental Mallory-Weiss tear which precluded esophageal dilation.  Medium sized hiatal hernia.  [name] ESOPHAGOGASTRODUODENOSCOPY (EGD) WITH PROPOFOL N/A 10/11/2019   Dr. Gala Romney: Esophageal stenosis status post dilation, moderate hiatal hernia  . EYE SURGERY    . FLEXIBLE SIGMOIDOSCOPY N/A 08/13/2019   Procedure: FLEXIBLE SIGMOIDOSCOPY;  Surgeon: [name], MD;  [facility];  Service: Endoscopy;  Laterality: N/A;  colonoscopy aboerted due to formed stool  and poor prep  . FOOT SURGERY    . KNEE ARTHROSCOPY WITH MEDIAL MENISECTOMY Left 05/06/2020   Procedure: KNEE ARTHROSCOPY WITH MEDIAL MENISCECTOMY AND LATERAL MENISCECTOMY;  Surgeon: [name], MD;  Location: [facility];  Service: Orthopedics;  Laterality: Left;  . Lower ext duplex doppler  03/14/14   Rt ABI 1.2  . LOWER EXTREMITY ANGIOGRAM Bilateral 02/18/2014   Procedure: LOWER EXTREMITY ANGIOGRAM;  Surgeon: [name], MD;  Location: [facility] CATH LAB;  Service: Cardiovascular;  Laterality: Bilateral;  . LOWER EXTREMITY ANGIOGRAM N/A 10/31/2014   Procedure: LOWER EXTREMITY ANGIOGRAM;  Surgeon: [name], MD;  Location: [facility] CATH LAB;  Service: Cardiovascular;  Laterality: N/A;  . Venia Minks DILATION N/A 10/11/2019   Procedure: Venia Minks DILATION;  Surgeon: [name], MD;  [facility];  Service: Endoscopy;  Laterality: N/A;  . MULTIPLE TOOTH EXTRACTIONS    . PV angiogram  02/18/2014   tibial vessel diseas bil.  [name] SPINE SURGERY    . tendon achillies lengthing and sesamoid      There were no vitals filed for this visit.   Subjective Assessment - 11/04/20 0953    Subjective pt states he has not went to the [facility] yet but plans on going soon.  Currently without any issues.    Currently in Pain? No/denies                           Wound Therapy - 11/04/20 1030    Subjective see above    Wound Properties Date First Assessed: 10/20/20 Time First Assessed: 1000 Wound Type: Diabetic ulcer Location: Foot Location Orientation: Anterior;Right Wound Description (Comments): bottom of foot Present on Admission: Yes   Wound Image View All Images View Images    Dressing Type Impregnated gauze (bismuth)    Dressing Changed Changed    Dressing Status Old drainage    Dressing Change Frequency PRN    Site / Wound Assessment Yellow;Pink;Red    % Wound base Red or Granulating 100%    Peri-wound Assessment Maceration   and callous   Wound Length (cm) 1 cm    Wound  Width (cm) 0.9 cm    Wound Depth (cm) 0.2 cm    Wound Volume (cm^3) 0.18 cm^3    Wound Surface Area (cm^2) 0.9 cm^2    Undermining (cm) none but with 0.5cm calloused, blanched perimeter    Margins Attached edges (approximated)    Drainage Amount Minimal    Drainage Description Serosanguineous    Treatment Cleansed;Debridement (Selective)    Selective Debridement - Location right forefoot    Selective Debridement - Tools Used Forceps;Scalpel    Selective Debridement - Tissue Removed callus, devitalized tissue    Wound Therapy - Clinical Statement see below assessment    Wound Therapy - Functional Problem List difficulty walking, washing, dressing  Factors Delaying/Impairing Wound Healing Diabetes Mellitus;Immobility    Wound Plan continue to cleanse and debride to improve approximation and healing; change dressings as needed.    Dressing  xeroform, Ca alginate over, 2X2, medipore, conform 3" and #5 netting            [facility] Adult PT Treatment/Exercise - 11/04/20 0001      Ambulation/Gait   Ambulation/Gait Yes    Ambulation/Gait Assistance 5: Supervision    Ambulation Distance (Feet) 226 Feet    Assistive device Rollator    Gait Pattern Step-to pattern;Decreased weight shift to left;Decreased hip/knee flexion - left;Decreased hip/knee flexion - right;Scissoring;Shuffle    Ambulation Surface Level      Knee/Hip Exercises: Seated   Sit to Sand 2 sets;10 reps;without UE support      Knee/Hip Exercises: Supine   Bridges 2 sets;10 reps    Bridges Limitations 5" holds    Straight Leg Raises Both;2 sets;10 reps      Knee/Hip Exercises: Sidelying   Hip ABduction Both;2 sets;10 reps      Knee/Hip Exercises: Prone   Hamstring Curl 2 sets;10 reps    Hamstring Curl Limitations cues to control movement in straight line    Hip Extension Both;10 reps;2 sets                    PT Short Term Goals - 10/20/20 1153      PT SHORT TERM GOAL #1   Title Patient will be  independent in self management strategies to improve quality of life and functional outcomes.    Time 4    Period Weeks    Status New    Target Date 11/17/20      PT SHORT TERM GOAL #2   Title Patient will be able to report at least 25% improvement in overall mobility.    Time 4    Period Weeks    Status New    Target Date 11/17/20      PT SHORT TERM GOAL #3   Title Patient's wound will be reduced in volume by at least 25% to demonstrate improved wound healing    Baseline 2.3 cm^3    Time 4    Period Weeks    Status New    Target Date 11/17/20             PT Long Term Goals - 10/20/20 1154      PT LONG TERM GOAL #1   Title Patient will be able to ambulate at least 100 feet with RW in 2 minutes to demonstrate improved ambulatory endurance    Time 8    Period Weeks    Status New    Target Date 12/15/20      PT LONG TERM GOAL #2   Title Patient's wound will reduce in overall volume byt at least 50% to demonstrate improved wound healing    Baseline 2.3 cm^3    Time 8    Period Weeks    Status New    Target Date 12/15/20      PT LONG TERM GOAL #3   Title Patient will be able to demonstate 5-110 degrees in left knee to demonstrate improved knee mobility    Time 8    Period Weeks    Status New    Target Date 12/15/20                 Plan - 11/04/20 1039    Clinical Impression Statement pt  with continued improvements in LE strength, gait and wound.    ABle to complete 2 sets of 20 reps with most exercises today with improved contraction and form noted.  Pt no longer needs to modify sidelying abduction or prone hip abduction, however still challenging and requires postural alignment cues.  Posture improved with sit to stands to come all the way straight after standing. Little debridement of wound needed today, mostly focusing on calloused perimeter.  No undermining present with increased approximation.  Did add calcium alginate after xerform to help absorb moisture  and prevent maceration around perimeter.  Pt needing less cues to reduce "shuffling" with ambulation.    Personal Factors and Comorbidities Comorbidity 1;Comorbidity 3+;Comorbidity 2    Comorbidities CHF, left knee surgery, right chronic foot wound, DB, restless leg syndromes    Examination-Activity Limitations Bathing;Lift;Locomotion Level;Transfers;Stand;Stairs;Squat    Stability/Clinical Decision Making Evolving/Moderate complexity    Rehab Potential Fair    PT Frequency 2x / week    PT Duration 8 weeks    PT Treatment/Interventions ADLs/Self Care Home Management;Other (comment);Moist Heat;Balance training;Therapeutic exercise;Therapeutic activities;Functional mobility training;Stair training;Gait training;DME Instruction;Ultrasound;Neuromuscular re-education;Patient/family education;Orthotic Fit/Training;Manual techniques;Taping;Passive range of motion;Electrical Stimulation    PT Next Visit Plan Progress LE strength and begin to work on stability as able. Continue with debridment and dressing changes. Update HEP as needed. Begin standing exercises including static balance challenges    PT Home Exercise Plan calf stretch with towel, bridges.  2/8: bridge, SLR, hip abduction, hip extension   2/10:  sit to stands           Patient will benefit from skilled therapeutic intervention in order to improve the following deficits and impairments:  Abnormal gait,Decreased endurance,Decreased skin integrity,Increased edema,Decreased activity tolerance,Decreased balance,Decreased mobility,Decreased knowledge of use of DME,Decreased strength,Difficulty walking,Pain,Decreased range of motion,Impaired flexibility  Visit Diagnosis: No diagnosis found.     Problem List Patient Active Problem List   Diagnosis Date Noted  . Gait abnormality 09/30/2020  . Mild non proliferative diabetic retinopathy ([place]) 07/16/2020  . Need for immunization against influenza 06/03/2020  . S/P left knee arthroscopy  05/06/20 05/13/2020  . Abnormal MRI 03/26/2020  . Joint disorder of knee 03/26/2020  . Fall at home, initial encounter 03/13/2020  . Anemia 09/03/2019  . Reflux esophagitis 09/03/2019  . Esophageal dysphagia 05/30/2019  . Constipation 05/30/2019  . Positive colorectal cancer screening using Cologuard test 05/30/2019  . Diabetic foot ulcer ([place]) 09/06/2018  . Congestive heart failure ([place]) 05/17/2016  . Bilateral carotid artery disease ([place]) 07/08/2014  . Obstructive sleep apnea 02/12/2014  . Restless leg syndrome 10/23/2013  . Overweight with body mass index (BMI) of 29 to 29.9 in adult 10/23/2013  . Hyperlipidemia 07/16/2013  . Peripheral arterial disease ([place]) 07/16/2013  . Hypertension 03/14/2012  . Type 2 diabetes mellitus with circulatory disorder ([place]) 03/14/2012  . Peripheral neuropathy 03/14/2012  . Cardiomyopathy- EF NL 01/2013, now 30-35% echo 03/14/2012   Teena Irani, PTA/CLT [phone]  Teena Irani 11/04/2020, 10:48 AM  [facility] [address] [place], [place], [address] Phone: [phone]   Fax:  [phone]  Name: [name] MRN: [id number] Date of Birth: [birth year]

## 2020-11-06 ENCOUNTER — Encounter (HOSPITAL_COMMUNITY): Payer: Self-pay | Admitting: Physical Therapy

## 2020-11-06 ENCOUNTER — Other Ambulatory Visit: Payer: Self-pay

## 2020-11-06 ENCOUNTER — Ambulatory Visit (HOSPITAL_COMMUNITY): Payer: HMO | Admitting: Physical Therapy

## 2020-11-06 DIAGNOSIS — E08621 Diabetes mellitus due to underlying condition with foot ulcer: Secondary | ICD-10-CM

## 2020-11-06 DIAGNOSIS — G8929 Other chronic pain: Secondary | ICD-10-CM

## 2020-11-06 DIAGNOSIS — R262 Difficulty in walking, not elsewhere classified: Secondary | ICD-10-CM

## 2020-11-06 DIAGNOSIS — M25562 Pain in left knee: Secondary | ICD-10-CM

## 2020-11-06 DIAGNOSIS — L97411 Non-pressure chronic ulcer of right heel and midfoot limited to breakdown of skin: Secondary | ICD-10-CM

## 2020-11-06 NOTE — Therapy (Signed)
[place], [place], [address] Phone: [phone]   Fax:  [phone]  Wound Care Therapy  Patient Details  Name: [name] MRN: [id number] Date of Birth: Feb 10, 1946 Referring Provider (PT): Perlie Mayo   Encounter Date: 11/06/2020   PT End of Session - 11/06/20 1645    Visit Number 6    Number of Visits 16    Date for PT Re-Evaluation 12/15/20    Authorization Type [employer], no VL, no auth , $15 co pay    Progress Note Due on Visit 10    PT Start Time 1500    PT Stop Time 1608    PT Time Calculation (min) 68 min    Equipment Utilized During Treatment Gait belt    Activity Tolerance Patient tolerated treatment well;Patient limited by fatigue    Behavior During Therapy [facility] for tasks assessed/performed           Past Medical History:  Diagnosis Date  . Aftercare following surgery of the circulatory system, [place] 12/04/2013  . Angina decubitus ([place]) 05/19/2016  . Arrhythmia 05/20/2016  . Arthritis   . ARTHRITIS, RIGHT FOOT 06/26/2008   Qualifier: Diagnosis of  By: Aline Brochure MD, Dorothyann Peng    . Cardiomyopathy- EF NL 01/2013, now 30-35% echo 03/14/2012  . Carotid artery occlusion    left s/p CEA  . Cellulitis of left foot 05/07/2018  . CHF (congestive heart failure) ([place])   . Critical lower limb ischemia (HCC) 02/28/2014   Critical limb ischemia   . Diabetes mellitus   . Elevated troponin 04/11/2016  . GERD (gastroesophageal reflux disease)   . History of stroke June 2013 03/14/2012  . HOH (hard of hearing)   . Hypertension   . Hypothyroidism   . Nonhealing skin ulcer ([place]) 10/28/2014  . Obstructive sleep apnea   . Orthostatic hypotension   . Osteomyelitis ([place])    left great toe  . Pain in the chest 05/17/2016  . Peripheral arterial disease ([place]), s/p PTA x 2 RLE    nonhealing ulcers bilaterally on each great toe  . Pneumonia   . PONV (postoperative nausea and vomiting)   . Pulmonary nodule  05/17/2016  . Restless leg syndrome   . Shortness of breath   . Stroke [facility]) March 08, 2012  . Toe osteomyelitis, left ([place])   . Ulcer of great toe, left, with necrosis of bone ([place])   . Wears dentures     Past Surgical History:  Procedure Laterality Date  . AMPUTATION TOE Left 07/25/2018   Procedure: AMPUTATION TOE INTERPHALANGEAL HALLUX LEFT;  Surgeon: Evelina Bucy, DPM;  Location: [place];  Service: Podiatry;  Laterality: Left;  . ANGIOPLASTY  02/28/14   diamond back orbital rotational atherectomy of Rt. tibial  . BACK SURGERY    . BONE BIOPSY Left 07/25/2018   Procedure: SUPERFICIAL BONE BIOPSY;  Surgeon: Evelina Bucy, DPM;  Location: [place];  Service: Podiatry;  Laterality: Left;  . CARDIAC CATHETERIZATION N/A 05/19/2016   Procedure: Right/Left Heart Cath and Coronary Angiography;  Surgeon: Belva Crome, MD;  Location: [place] [facility];  Service: Cardiovascular;  Laterality: N/A;  . CERVICAL FUSION    . ENDARTERECTOMY Left 11/29/2013   Procedure: ENDARTERECTOMY CAROTID;  Surgeon: Serafina Mitchell, MD;  Location: [place];  Service: Vascular;  Laterality: Left;  . ESOPHAGOGASTRODUODENOSCOPY  02/2010   Dr. Gala Romney: patient presented with food impaction, schatzki ring with superimposed component of stricture  with erosive reflux esophagitis, s/p disimpaction but dilation planned at later date   . ESOPHAGOGASTRODUODENOSCOPY (EGD) WITH PROPOFOL N/A 08/13/2019   Dr. Gala Romney: Erosive reflux esophagitis with mild stricture and incidental Mallory-Weiss tear which precluded esophageal dilation.  Medium sized hiatal hernia.  [name] ESOPHAGOGASTRODUODENOSCOPY (EGD) WITH PROPOFOL N/A 10/11/2019   Dr. Gala Romney: Esophageal stenosis status post dilation, moderate hiatal hernia  . EYE SURGERY    . FLEXIBLE SIGMOIDOSCOPY N/A 08/13/2019   Procedure: FLEXIBLE SIGMOIDOSCOPY;  Surgeon: [name], MD;  [facility];  Service: Endoscopy;  Laterality: N/A;  colonoscopy aboerted due to formed stool and  poor prep  . FOOT SURGERY    . KNEE ARTHROSCOPY WITH MEDIAL MENISECTOMY Left 05/06/2020   Procedure: KNEE ARTHROSCOPY WITH MEDIAL MENISCECTOMY AND LATERAL MENISCECTOMY;  Surgeon: [name], MD;  Location: [facility];  Service: Orthopedics;  Laterality: Left;  . Lower ext duplex doppler  03/14/14   Rt ABI 1.2  . LOWER EXTREMITY ANGIOGRAM Bilateral 02/18/2014   Procedure: LOWER EXTREMITY ANGIOGRAM;  Surgeon: [name], MD;  Location: [facility] CATH LAB;  Service: Cardiovascular;  Laterality: Bilateral;  . LOWER EXTREMITY ANGIOGRAM N/A 10/31/2014   Procedure: LOWER EXTREMITY ANGIOGRAM;  Surgeon: [name], MD;  Location: [facility] CATH LAB;  Service: Cardiovascular;  Laterality: N/A;  . Venia Minks DILATION N/A 10/11/2019   Procedure: Venia Minks DILATION;  Surgeon: [name], MD;  [facility];  Service: Endoscopy;  Laterality: N/A;  . MULTIPLE TOOTH EXTRACTIONS    . PV angiogram  02/18/2014   tibial vessel diseas bil.  [name] SPINE SURGERY    . tendon achillies lengthing and sesamoid      There were no vitals filed for this visit.    Subjective Assessment - 11/06/20 1633    Subjective Pt states that he is trying to do his exercises everyday.    Pertinent History CHF , R foot wound, left knee scope, DB, Restless leg syndrome, hx of stroke.    Patient Stated Goals be able to walk normally and function correctly.    Currently in Pain? Yes    Pain Score 2     Pain Location Foot    Pain Orientation Right    Pain Descriptors / Indicators Aching    Pain Type Chronic pain    Pain Onset More than a month ago    Pain Frequency Intermittent    Aggravating Factors  walking    Pain Relieving Factors elevation                     Wound Therapy - 11/06/20 1633    Subjective see above    Wound Properties Date First Assessed: 10/20/20 Time First Assessed: 1000 Wound Type: Diabetic ulcer Location: Foot Location Orientation: Anterior;Right Wound Description (Comments): bottom of  foot Present on Admission: Yes   Dressing Type Alginate;Gauze (Comment)    Dressing Changed Changed    Dressing Status Old drainage    Dressing Change Frequency PRN    Site / Wound Assessment Pink;Dry    % Wound base Red or Granulating 100%    Peri-wound Assessment Other (Comment)   callous   Margins Unattached edges (unapproximated)    Drainage Amount Minimal    Drainage Description Serosanguineous    Treatment Cleansed;Debridement (Selective)    Selective Debridement - Location edges of wound and callous surrounding wound.    Selective Debridement - Tools Used Forceps;Scissors    Selective Debridement - Tissue Removed callous, devitalized  tissue    Wound Therapy - Clinical Statement PT's restless leg syndrome was very severe today making it difficult to debride.  Wound bed is pink but pale, not healthy red granulating tissue.  wound bed appeared dry therefore therapist used honey in wound bed.  Netting had just worked it's way up above the dressing and could be a risk for making a wound therefore therapist discontinued the netting application.    Wound Therapy - Functional Problem List difficulty walking, washing, dressing    Factors Delaying/Impairing Wound Healing Diabetes Mellitus;Immobility    Wound Plan continue to cleanse and debride to improve approximation and healing; change dressings as needed.    Dressing  medihoney, 2x2,secured with medipore tape with  4x4 and kerlix following.           [place] Adult PT Treatment/Exercise - 11/06/20 0001      Exercises   Exercises Knee/Hip      Knee/Hip Exercises: Standing   Heel Raises Both;10 reps    Other Standing Knee Exercises marching x 10; side stepping at mat x 2 RT      Knee/Hip Exercises: Seated   Long Arc Quad Strengthening;Both;15 reps    Long Arc Quad Weight 3 lbs.    Sit to [employer] 10 reps      Knee/Hip Exercises: Supine   Bridges 20 reps    Other Supine Knee/Hip Exercises B hip abduction x 15      Knee/Hip Exercises:  Sidelying   Hip ABduction Both;10 reps                    PT Short Term Goals - 11/06/20 1649      PT SHORT TERM GOAL #1   Title Patient will be independent in self management strategies to improve quality of life and functional outcomes.    Time 4    Period Weeks    Status New    Target Date 11/17/20      PT SHORT TERM GOAL #2   Title Patient will be able to report at least 25% improvement in overall mobility.    Time 4    Period Weeks    Status On-going    Target Date 11/17/20      PT SHORT TERM GOAL #3   Title Patient's wound will be reduced in volume by at least 25% to demonstrate improved wound healing    Baseline 2.3 cm^3    Time 4    Period Weeks    Status On-going    Target Date 11/17/20             PT Long Term Goals - 11/06/20 1650      PT LONG TERM GOAL #1   Title Patient will be able to ambulate at least 100 feet with RW in 2 minutes to demonstrate improved ambulatory endurance    Time 8    Period Weeks    Status On-going      PT LONG TERM GOAL #2   Title Patient's wound will reduce in overall volume byt at least 50% to demonstrate improved wound healing    Baseline 2.3 cm^3    Time 8    Period Weeks    Status On-going      PT LONG TERM GOAL #3   Title Patient will be able to demonstate 5-110 degrees in left knee to demonstrate improved knee mobility    Time 8    Period Weeks    Status On-going  PT LONG TERM GOAL #4   Status On-going      PT LONG TERM GOAL #5   Status On-going                 Plan - 11/06/20 1646    Clinical Impression Statement Added marching and sidestepping to address balance with good results.  Pt weakest area are B glut medius, PT HEP updated to address this.  Wound was difficult to debride due to restless leg syndrome.    Personal Factors and Comorbidities Comorbidity 1;Comorbidity 3+;Comorbidity 2    Comorbidities CHF, left knee surgery, right chronic foot wound, DB, restless leg syndromes     Examination-Activity Limitations Bathing;Lift;Locomotion Level;Transfers;Stand;Stairs;Squat    Stability/Clinical Decision Making Evolving/Moderate complexity    Rehab Potential Fair    PT Frequency 2x / week    PT Duration 8 weeks    PT Treatment/Interventions ADLs/Self Care Home Management;Other (comment);Moist Heat;Balance training;Therapeutic exercise;Therapeutic activities;Functional mobility training;Stair training;Gait training;DME Instruction;Ultrasound;Neuromuscular re-education;Patient/family education;Orthotic Fit/Training;Manual techniques;Taping;Passive range of motion;Electrical Stimulation    PT Next Visit Plan Progress LE strength and begin to work on stability as able. Continue with debridment and dressing changes. Update HEP as needed. Begin standing exercises including static balance challenges    PT Home Exercise Plan calf stretch with towel, bridges.  2/8: bridge, SLR, hip abduction, hip extension   2/10:  sit to stands; 2/17:  LAQ with wt, supine hip abduction, sidelying hip abduction,           Patient will benefit from skilled therapeutic intervention in order to improve the following deficits and impairments:  Abnormal gait,Decreased endurance,Decreased skin integrity,Increased edema,Decreased activity tolerance,Decreased balance,Decreased mobility,Decreased knowledge of use of DME,Decreased strength,Difficulty walking,Pain,Decreased range of motion,Impaired flexibility  Visit Diagnosis: Difficulty in walking, not elsewhere classified  Diabetic ulcer of right midfoot associated with diabetes mellitus due to underlying condition, limited to breakdown of skin ([place])  Chronic pain of left knee     Problem List Patient Active Problem List   Diagnosis Date Noted  . Gait abnormality 09/30/2020  . Mild non proliferative diabetic retinopathy ([place]) 07/16/2020  . Need for immunization against influenza 06/03/2020  . S/P left knee arthroscopy 05/06/20 05/13/2020  .  Abnormal MRI 03/26/2020  . Joint disorder of knee 03/26/2020  . Fall at home, initial encounter 03/13/2020  . Anemia 09/03/2019  . Reflux esophagitis 09/03/2019  . Esophageal dysphagia 05/30/2019  . Constipation 05/30/2019  . Positive colorectal cancer screening using Cologuard test 05/30/2019  . Diabetic foot ulcer ([place]) 09/06/2018  . Congestive heart failure ([place]) 05/17/2016  . Bilateral carotid artery disease ([place]) 07/08/2014  . Obstructive sleep apnea 02/12/2014  . Restless leg syndrome 10/23/2013  . Overweight with body mass index (BMI) of 29 to 29.9 in adult 10/23/2013  . Hyperlipidemia 07/16/2013  . Peripheral arterial disease ([place]) 07/16/2013  . Hypertension 03/14/2012  . Type 2 diabetes mellitus with circulatory disorder ([place]) 03/14/2012  . Peripheral neuropathy 03/14/2012  . Cardiomyopathy- EF NL 01/2013, now 30-35% echo 03/14/2012   Rayetta Humphrey, PT CLT [phone] 11/06/2020, 4:51 PM  [place] [address] [place], [place], [address] Phone: [phone]   Fax:  [phone]  Name: [name] MRN: [id number] Date of Birth: [birth year]

## 2020-11-10 ENCOUNTER — Ambulatory Visit (HOSPITAL_COMMUNITY): Payer: HMO | Admitting: Physical Therapy

## 2020-11-10 ENCOUNTER — Other Ambulatory Visit: Payer: Self-pay

## 2020-11-10 ENCOUNTER — Encounter (HOSPITAL_COMMUNITY): Payer: Self-pay | Admitting: Physical Therapy

## 2020-11-10 DIAGNOSIS — R262 Difficulty in walking, not elsewhere classified: Secondary | ICD-10-CM

## 2020-11-10 DIAGNOSIS — M25562 Pain in left knee: Secondary | ICD-10-CM

## 2020-11-10 DIAGNOSIS — G8929 Other chronic pain: Secondary | ICD-10-CM

## 2020-11-10 DIAGNOSIS — E08621 Diabetes mellitus due to underlying condition with foot ulcer: Secondary | ICD-10-CM | POA: Diagnosis not present

## 2020-11-10 DIAGNOSIS — L97411 Non-pressure chronic ulcer of right heel and midfoot limited to breakdown of skin: Secondary | ICD-10-CM

## 2020-11-10 NOTE — Therapy (Signed)
[place], [place], [address] Phone: [phone]   Fax:  [phone]  Wound Care Therapy  Patient Details  Name: [name] MRN: [id number] Date of Birth: [birth year] Referring Provider (PT): Perlie Mayo   Encounter Date: 11/10/2020   PT End of Session - 11/10/20 0917    Visit Number 7    Number of Visits 16    Date for PT Re-Evaluation 12/15/20    Authorization Type [employer], no VL, no auth , $15 co pay    Progress Note Due on Visit 10    PT Start Time 0918    PT Stop Time 1020    PT Time Calculation (min) 62 min    Equipment Utilized During Treatment Gait belt    Activity Tolerance Patient tolerated treatment well;Patient limited by fatigue    Behavior During Therapy [facility] for tasks assessed/performed           Past Medical History:  Diagnosis Date  . Aftercare following surgery of the circulatory system, [name] 12/04/2013  . Angina decubitus ([place]) 05/19/2016  . Arrhythmia 05/20/2016  . Arthritis   . ARTHRITIS, RIGHT FOOT 06/26/2008   Qualifier: Diagnosis of  By: Aline Brochure MD, Dorothyann Peng    . Cardiomyopathy- EF NL 01/2013, now 30-35% echo 03/14/2012  . Carotid artery occlusion    left s/p CEA  . Cellulitis of left foot 05/07/2018  . CHF (congestive heart failure) ([place])   . Critical lower limb ischemia (HCC) 02/28/2014   Critical limb ischemia   . Diabetes mellitus   . Elevated troponin 04/11/2016  . GERD (gastroesophageal reflux disease)   . History of stroke June 2013 03/14/2012  . HOH (hard of hearing)   . Hypertension   . Hypothyroidism   . Nonhealing skin ulcer ([place]) 10/28/2014  . Obstructive sleep apnea   . Orthostatic hypotension   . Osteomyelitis ([place])    left great toe  . Pain in the chest 05/17/2016  . Peripheral arterial disease ([place]), s/p PTA x 2 RLE    nonhealing ulcers bilaterally on each great toe  . Pneumonia   . PONV (postoperative nausea and vomiting)   . Pulmonary nodule  05/17/2016  . Restless leg syndrome   . Shortness of breath   . Stroke [facility]) March 08, 2012  . Toe osteomyelitis, left ([place])   . Ulcer of great toe, left, with necrosis of bone ([place])   . Wears dentures     Past Surgical History:  Procedure Laterality Date  . AMPUTATION TOE Left 07/25/2018   Procedure: AMPUTATION TOE INTERPHALANGEAL HALLUX LEFT;  Surgeon: Evelina Bucy, DPM;  Location: [place];  Service: Podiatry;  Laterality: Left;  . ANGIOPLASTY  02/28/14   diamond back orbital rotational atherectomy of Rt. tibial  . BACK SURGERY    . BONE BIOPSY Left 07/25/2018   Procedure: SUPERFICIAL BONE BIOPSY;  Surgeon: Evelina Bucy, DPM;  Location: [place];  Service: Podiatry;  Laterality: Left;  . CARDIAC CATHETERIZATION N/A 05/19/2016   Procedure: Right/Left Heart Cath and Coronary Angiography;  Surgeon: Belva Crome, MD;  Location: [place] CV [facility];  Service: Cardiovascular;  Laterality: N/A;  . CERVICAL FUSION    . ENDARTERECTOMY Left 11/29/2013   Procedure: ENDARTERECTOMY CAROTID;  Surgeon: Serafina Mitchell, MD;  Location: [place];  Service: Vascular;  Laterality: Left;  . ESOPHAGOGASTRODUODENOSCOPY  02/2010   Dr. Gala Romney: patient presented with food impaction, schatzki ring with superimposed component of stricture  with erosive reflux esophagitis, s/p disimpaction but dilation planned at later date   . ESOPHAGOGASTRODUODENOSCOPY (EGD) WITH PROPOFOL N/A 08/13/2019   Dr. Gala Romney: Erosive reflux esophagitis with mild stricture and incidental Mallory-Weiss tear which precluded esophageal dilation.  Medium sized hiatal hernia.  [name] ESOPHAGOGASTRODUODENOSCOPY (EGD) WITH PROPOFOL N/A 10/11/2019   Dr. Gala Romney: Esophageal stenosis status post dilation, moderate hiatal hernia  . EYE SURGERY    . FLEXIBLE SIGMOIDOSCOPY N/A 08/13/2019   Procedure: FLEXIBLE SIGMOIDOSCOPY;  Surgeon: [name], MD;  [facility];  Service: Endoscopy;  Laterality: N/A;  colonoscopy aboerted due to formed stool and  poor prep  . FOOT SURGERY    . KNEE ARTHROSCOPY WITH MEDIAL MENISECTOMY Left 05/06/2020   Procedure: KNEE ARTHROSCOPY WITH MEDIAL MENISCECTOMY AND LATERAL MENISCECTOMY;  Surgeon: [name], MD;  Location: [facility];  Service: Orthopedics;  Laterality: Left;  . Lower ext duplex doppler  03/14/14   Rt ABI 1.2  . LOWER EXTREMITY ANGIOGRAM Bilateral 02/18/2014   Procedure: LOWER EXTREMITY ANGIOGRAM;  Surgeon: [name], MD;  Location: [facility] CATH LAB;  Service: Cardiovascular;  Laterality: Bilateral;  . LOWER EXTREMITY ANGIOGRAM N/A 10/31/2014   Procedure: LOWER EXTREMITY ANGIOGRAM;  Surgeon: [name], MD;  Location: [facility] CATH LAB;  Service: Cardiovascular;  Laterality: N/A;  . Venia Minks DILATION N/A 10/11/2019   Procedure: Venia Minks DILATION;  Surgeon: [name], MD;  [facility];  Service: Endoscopy;  Laterality: N/A;  . MULTIPLE TOOTH EXTRACTIONS    . PV angiogram  02/18/2014   tibial vessel diseas bil.  [name] SPINE SURGERY    . tendon achillies lengthing and sesamoid      There were no vitals filed for this visit.      [facility] PT Assessment - 11/10/20 0001      Assessment   Medical Diagnosis R foot ulcer and gait instability    Referring Provider (PT) Perlie Mayo                   Wound Therapy - 11/10/20 0001    Subjective States that his bandage from Thursday came off on Friday so he has been keeping it covered with a band aid reports he changed the bandage daily since last session. States he is not wearing compression garments because they rolled up the band aid. Reports his knee feels fine, he is just having an off day. Reports no pain.    Patient and Family Stated Goals to have wound to heal and to be able to walk    Prior Treatments wound clinics, neosporin on bandaid but bandaid wound stick.    Pain Score 0-No pain    Evaluation and Treatment Procedures Explained to Patient/Family Yes    Evaluation and Treatment Procedures agreed to    Wound  Properties Date First Assessed: 10/20/20 Time First Assessed: 1000 Wound Type: Diabetic ulcer Location: Foot Location Orientation: Anterior;Right Wound Description (Comments): bottom of foot Present on Admission: Yes   Wound Image View All Images View Images    Dressing Type --   bandaid and neosporn   Dressing Changed Changed    Dressing Status Dry    Dressing Change Frequency PRN    Site / Wound Assessment Pink;Pale    % Wound base Red or Granulating 100%    Peri-wound Assessment --   callus   Wound Length (cm) 1.3 cm   was 1.0   Wound Width (cm) 1.2 cm   was .9  Wound Depth (cm) 0.3 cm   was .2   Wound Volume (cm^3) 0.47 cm^3    Wound Surface Area (cm^2) 1.56 cm^2    Undermining (cm) none    Margins Unattached edges (unapproximated)    Drainage Amount None    Treatment Cleansed;Debridement (Selective)    Selective Debridement - Location edges of wound and callous surrounding wound.    Selective Debridement - Tools Used Forceps;Scissors    Selective Debridement - Tissue Removed callous, devitalized tissue    Wound Therapy - Clinical Statement Overall wound perimeter reducing in overall callus. Wound measurements taken today and wound measured larger then last measurement but with last two sessions focusing on heavy debridement of callus. Patient has had a difficult time keeping bandage secured between sessions, added additional medipore tape to assist with this. Continued with medihoney and kerlix.    Wound Therapy - Functional Problem List difficulty walking, washing, dressing    Factors Delaying/Impairing Wound Healing Diabetes Mellitus;Immobility    Wound Plan continue to cleanse and debride to improve approximation and healing; change dressings as needed.    Dressing  medihoney, 2x2,secured with medipore tape with  4x4 and kerlix following.           [place] PT Treatment/Exercise - 11/10/20 0001      Ambulation/Gait   Ambulation/Gait Yes    Ambulation/Gait Assistance 5:  Supervision    Ambulation Distance (Feet) 226 Feet    Assistive device Rollator    Gait Pattern Step-to pattern;Decreased weight shift to left;Decreased hip/knee flexion - left;Decreased hip/knee flexion - right;Scissoring;Shuffle    Ambulation Surface Level    Gait velocity decreased    Gait Comments cues for posturing, keeping feet wider apart and no shuffling      Knee/Hip Exercises: Standing   Hip Flexion AROM;Stengthening;Both;3 sets;10 reps;Knee bent   UE support   Hip Abduction AROM;Stengthening;4 sets;5 reps;Knee bent   hands holding on     Knee/Hip Exercises: Seated   Long Arc Quad AROM;Strengthening;Both;3 sets;10 reps    Sit to [employer] 10 reps;2 sets;without UE support   elevated surface     Knee/Hip Exercises: Supine   Short Arc Quad Sets 3 sets;10 reps;Both   2# ankle weights   Bridges 3 sets;10 reps;Strengthening    Straight Leg Raises Both;10 reps;3 sets      Knee/Hip Exercises: Sidelying   Clams 3x10 reps each Bilateral 2" holds                    PT Short Term Goals - 11/06/20 1649      PT SHORT TERM GOAL #1   Title Patient will be independent in self management strategies to improve quality of life and functional outcomes.    Time 4    Period Weeks    Status New    Target Date 11/17/20      PT SHORT TERM GOAL #2   Title Patient will be able to report at least 25% improvement in overall mobility.    Time 4    Period Weeks    Status On-going    Target Date 11/17/20      PT SHORT TERM GOAL #3   Title Patient's wound will be reduced in volume by at least 25% to demonstrate improved wound healing    Baseline 2.3 cm^3    Time 4    Period Weeks    Status On-going    Target Date 11/17/20  PT Long Term Goals - 11/06/20 1650      PT LONG TERM GOAL #1   Title Patient will be able to ambulate at least 100 feet with RW in 2 minutes to demonstrate improved ambulatory endurance    Time 8    Period Weeks    Status On-going      PT  LONG TERM GOAL #2   Title Patient's wound will reduce in overall volume byt at least 50% to demonstrate improved wound healing    Baseline 2.3 cm^3    Time 8    Period Weeks    Status On-going      PT LONG TERM GOAL #3   Title Patient will be able to demonstate 5-110 degrees in left knee to demonstrate improved knee mobility    Time 8    Period Weeks    Status On-going      PT LONG TERM GOAL #4   Status On-going      PT LONG TERM GOAL #5   Status On-going                 Plan - 11/10/20 1010    Clinical Impression Statement Continued limitations noted in terminal knee extension but this is noted bilaterally. Continued with LE strengthening as tolerated with cues for patient to breath throughout session. Patient unable to stand up tall secondary to knee ROM but otherwise patient is starting to move better. Will continue with current POC as tolerated.    Personal Factors and Comorbidities Comorbidity 1;Comorbidity 3+;Comorbidity 2    Comorbidities CHF, left knee surgery, right chronic foot wound, DB, restless leg syndromes    Examination-Activity Limitations Bathing;Lift;Locomotion Level;Transfers;Stand;Stairs;Squat    Stability/Clinical Decision Making Evolving/Moderate complexity    Rehab Potential Fair    PT Frequency 2x / week    PT Duration 8 weeks    PT Treatment/Interventions ADLs/Self Care Home Management;Other (comment);Moist Heat;Balance training;Therapeutic exercise;Therapeutic activities;Functional mobility training;Stair training;Gait training;DME Instruction;Ultrasound;Neuromuscular re-education;Patient/family education;Orthotic Fit/Training;Manual techniques;Taping;Passive range of motion;Electrical Stimulation    PT Next Visit Plan Progress LE strength and begin to work on stability as able. Continue with debridment and dressing changes. Update HEP as needed. Begin standing exercises including static balance challenges    PT Home Exercise Plan calf stretch with  towel, bridges.  2/8: bridge, SLR, hip abduction, hip extension   2/10:  sit to stands; 2/17:  LAQ with wt, supine hip abduction, sidelying hip abduction, 2/21 standing hip abd    Consulted and Agree with Plan of Care Patient           Patient will benefit from skilled therapeutic intervention in order to improve the following deficits and impairments:  Abnormal gait,Decreased endurance,Decreased skin integrity,Increased edema,Decreased activity tolerance,Decreased balance,Decreased mobility,Decreased knowledge of use of DME,Decreased strength,Difficulty walking,Pain,Decreased range of motion,Impaired flexibility  Visit Diagnosis: Difficulty in walking, not elsewhere classified  Diabetic ulcer of right midfoot associated with diabetes mellitus due to underlying condition, limited to breakdown of skin (HCC)  Chronic pain of left knee     Problem List Patient Active Problem List   Diagnosis Date Noted  . Gait abnormality 09/30/2020  . Mild non proliferative diabetic retinopathy ([place]) 07/16/2020  . Need for immunization against influenza 06/03/2020  . S/P left knee arthroscopy 05/06/20 05/13/2020  . Abnormal MRI 03/26/2020  . Joint disorder of knee 03/26/2020  . Fall at home, initial encounter 03/13/2020  . Anemia 09/03/2019  . Reflux esophagitis 09/03/2019  . Esophageal dysphagia 05/30/2019  . Constipation 05/30/2019  .  Positive colorectal cancer screening using Cologuard test 05/30/2019  . Diabetic foot ulcer ([place]) 09/06/2018  . Congestive heart failure ([place]) 05/17/2016  . Bilateral carotid artery disease ([place]) 07/08/2014  . Obstructive sleep apnea 02/12/2014  . Restless leg syndrome 10/23/2013  . Overweight with body mass index (BMI) of 29 to 29.9 in adult 10/23/2013  . Hyperlipidemia 07/16/2013  . Peripheral arterial disease ([place]) 07/16/2013  . Hypertension 03/14/2012  . Type 2 diabetes mellitus with circulatory disorder ([place]) 03/14/2012  . Peripheral neuropathy 03/14/2012   . Cardiomyopathy- EF NL 01/2013, now 30-35% echo 03/14/2012   10:26 AM, 11/10/20 [name], DPT Physical Therapy with [facility]  [phone] office  [place] [address] [place], [place], [address] Phone: [phone]   Fax:  [phone]  Name: JERELLE VIRDEN MRN: [id number] Date of Birth: 08-13-1946

## 2020-11-12 ENCOUNTER — Encounter (HOSPITAL_COMMUNITY): Payer: Self-pay

## 2020-11-12 ENCOUNTER — Ambulatory Visit (HOSPITAL_COMMUNITY): Payer: HMO

## 2020-11-12 ENCOUNTER — Other Ambulatory Visit: Payer: Self-pay

## 2020-11-12 DIAGNOSIS — R262 Difficulty in walking, not elsewhere classified: Secondary | ICD-10-CM

## 2020-11-12 DIAGNOSIS — L97411 Non-pressure chronic ulcer of right heel and midfoot limited to breakdown of skin: Secondary | ICD-10-CM

## 2020-11-12 DIAGNOSIS — G8929 Other chronic pain: Secondary | ICD-10-CM

## 2020-11-12 DIAGNOSIS — M25562 Pain in left knee: Secondary | ICD-10-CM

## 2020-11-12 DIAGNOSIS — E08621 Diabetes mellitus due to underlying condition with foot ulcer: Secondary | ICD-10-CM

## 2020-11-12 NOTE — Therapy (Signed)
[place], [place], [address] Phone: [phone]   Fax:  [phone]  Physical Therapy Treatment  Patient Details  Name: [name] MRN: [id number] Date of Birth: 1946-07-07 Referring Provider (PT): Perlie Mayo   Encounter Date: 11/12/2020   PT End of Session - 11/12/20 1235    Visit Number 8    Number of Visits 16    Date for PT Re-Evaluation 12/15/20    Authorization Type [employer], no VL, no auth , $15 co pay    Progress Note Due on Visit 10    PT Start Time 1008   restroom break   PT Stop Time 1130    PT Time Calculation (min) 82 min    Equipment Utilized During Treatment Gait belt    Activity Tolerance Patient tolerated treatment well;Patient limited by fatigue    Behavior During Therapy [facility] for tasks assessed/performed           Past Medical History:  Diagnosis Date  . Aftercare following surgery of the circulatory system, [place] 12/04/2013  . Angina decubitus ([place]) 05/19/2016  . Arrhythmia 05/20/2016  . Arthritis   . ARTHRITIS, RIGHT FOOT 06/26/2008   Qualifier: Diagnosis of  By: Aline Brochure MD, Dorothyann Peng    . Cardiomyopathy- EF NL 01/2013, now 30-35% echo 03/14/2012  . Carotid artery occlusion    left s/p CEA  . Cellulitis of left foot 05/07/2018  . CHF (congestive heart failure) ([place])   . Critical lower limb ischemia (HCC) 02/28/2014   Critical limb ischemia   . Diabetes mellitus   . Elevated troponin 04/11/2016  . GERD (gastroesophageal reflux disease)   . History of stroke June 2013 03/14/2012  . HOH (hard of hearing)   . Hypertension   . Hypothyroidism   . Nonhealing skin ulcer ([place]) 10/28/2014  . Obstructive sleep apnea   . Orthostatic hypotension   . Osteomyelitis ([place])    left great toe  . Pain in the chest 05/17/2016  . Peripheral arterial disease ([place]), s/p PTA x 2 RLE    nonhealing ulcers bilaterally on each great toe  . Pneumonia   . PONV (postoperative nausea and vomiting)    . Pulmonary nodule 05/17/2016  . Restless leg syndrome   . Shortness of breath   . Stroke [facility]) March 08, 2012  . Toe osteomyelitis, left ([place])   . Ulcer of great toe, left, with necrosis of bone ([place])   . Wears dentures     Past Surgical History:  Procedure Laterality Date  . AMPUTATION TOE Left 07/25/2018   Procedure: AMPUTATION TOE INTERPHALANGEAL HALLUX LEFT;  Surgeon: Evelina Bucy, DPM;  Location: [place];  Service: Podiatry;  Laterality: Left;  . ANGIOPLASTY  02/28/14   diamond back orbital rotational atherectomy of Rt. tibial  . BACK SURGERY    . BONE BIOPSY Left 07/25/2018   Procedure: SUPERFICIAL BONE BIOPSY;  Surgeon: Evelina Bucy, DPM;  Location: [place];  Service: Podiatry;  Laterality: Left;  . CARDIAC CATHETERIZATION N/A 05/19/2016   Procedure: Right/Left Heart Cath and Coronary Angiography;  Surgeon: Belva Crome, MD;  Location: [place] CV LAB;  Service: Cardiovascular;  Laterality: N/A;  . CERVICAL FUSION    . ENDARTERECTOMY Left 11/29/2013   Procedure: ENDARTERECTOMY CAROTID;  Surgeon: Serafina Mitchell, MD;  Location: [place];  Service: Vascular;  Laterality: Left;  . ESOPHAGOGASTRODUODENOSCOPY  02/2010   Dr. Gala Romney: patient presented with food impaction, schatzki ring with superimposed  component of stricture with erosive reflux esophagitis, s/p disimpaction but dilation planned at later date   . ESOPHAGOGASTRODUODENOSCOPY (EGD) WITH PROPOFOL N/A 08/13/2019   Dr. Gala Romney: Erosive reflux esophagitis with mild stricture and incidental Mallory-Weiss tear which precluded esophageal dilation.  Medium sized hiatal hernia.  [name] ESOPHAGOGASTRODUODENOSCOPY (EGD) WITH PROPOFOL N/A 10/11/2019   Dr. Gala Romney: Esophageal stenosis status post dilation, moderate hiatal hernia  . EYE SURGERY    . FLEXIBLE SIGMOIDOSCOPY N/A 08/13/2019   Procedure: FLEXIBLE SIGMOIDOSCOPY;  Surgeon: [name], MD;  [facility];  Service: Endoscopy;  Laterality: N/A;  colonoscopy aboerted due  to formed stool and poor prep  . FOOT SURGERY    . KNEE ARTHROSCOPY WITH MEDIAL MENISECTOMY Left 05/06/2020   Procedure: KNEE ARTHROSCOPY WITH MEDIAL MENISCECTOMY AND LATERAL MENISCECTOMY;  Surgeon: [name], MD;  [facility];  Service: Orthopedics;  Laterality: Left;  . Lower ext duplex doppler  03/14/14   Rt ABI 1.2  . LOWER EXTREMITY ANGIOGRAM Bilateral 02/18/2014   Procedure: LOWER EXTREMITY ANGIOGRAM;  Surgeon: [name], MD;  Location: [facility] CATH LAB;  Service: Cardiovascular;  Laterality: Bilateral;  . LOWER EXTREMITY ANGIOGRAM N/A 10/31/2014   Procedure: LOWER EXTREMITY ANGIOGRAM;  Surgeon: [name], MD;  Location: [facility] CATH LAB;  Service: Cardiovascular;  Laterality: N/A;  . Venia Minks DILATION N/A 10/11/2019   Procedure: Venia Minks DILATION;  Surgeon: [name], MD;  [facility];  Service: Endoscopy;  Laterality: N/A;  . MULTIPLE TOOTH EXTRACTIONS    . PV angiogram  02/18/2014   tibial vessel diseas bil.  [name] SPINE SURGERY    . tendon achillies lengthing and sesamoid      There were no vitals filed for this visit.   Subjective Assessment - 11/12/20 1044    Subjective Pt stated he is feeling good, has been compliant with HEP daily.    Pertinent History CHF , R foot wound, left knee scope, DB, Restless leg syndrome, hx of stroke.    Patient Stated Goals be able to walk normally and function correctly.    Currently in Pain? No/denies                           Wound Therapy - 11/12/20 0001    Subjective Pt stated he is feeling good, has been compliant with HEP daily.    Patient and Family Stated Goals to have wound to heal and to be able to walk    Prior Treatments wound clinics, neosporin on bandaid but bandaid wound stick.    Pain Scale 0-10    Pain Score 0-No pain    Evaluation and Treatment Procedures Explained to Patient/Family Yes    Evaluation and Treatment Procedures agreed to    Wound Properties Date First Assessed:  10/20/20 Time First Assessed: 1000 Wound Type: Diabetic ulcer Location: Foot Location Orientation: Anterior;Right Wound Description (Comments): bottom of foot Present on Admission: Yes   Wound Image View All Images View Images    Dressing Type Impregnated gauze (bismuth);Gauze (Comment)   xeroform, 2x2, toe wrap, kerlix with #5 netting   Dressing Changed Changed    Dressing Status Dry    Dressing Change Frequency PRN    Site / Wound Assessment Pale;Pink    % Wound base Red or Granulating 100%    Peri-wound Assessment --   callous perimeter   Margins Unattached edges (unapproximated)    Drainage Amount Scant    Drainage  Description Serosanguineous    Treatment Cleansed;Debridement (Selective)    Selective Debridement - Location edges of wound and callous surrounding wound.    Selective Debridement - Tools Used Forceps;Scalpel    Selective Debridement - Tissue Removed callous, devitalized tissue    Wound Therapy - Clinical Statement Wound dressings wet and have slid up foot exposing wound with maceration present surrounding wound.  Pt.'s restless legs increase difficutly with debridement.  Wound bed 100% granulated so began xeroform wound bed, vaseline perimeter.  For dressings included 2x2 with medipore tape, toe wrap, kerlix and netting with opening for great toe to hopefully reduce dressings sliding up.    Wound Therapy - Functional Problem List difficulty walking, washing, dressing    Factors Delaying/Impairing Wound Healing Diabetes Mellitus;Immobility    Wound Therapy - Frequency 2X / week    Wound Therapy - Current Recommendations PT    Wound Plan continue to cleanse and debride to improve approximation and healing; change dressings as needed.    Dressing  xeroform, 2x2, medipore tape, toe wrap, kerlix with #5 netting            [facility] Adult PT Treatment/Exercise - 11/12/20 0001      Knee/Hip Exercises: Standing   Hip Flexion AROM;Stengthening;Both;10 reps;Knee bent;2 sets    alternating   Hip Abduction AROM;Stengthening;4 sets;5 reps;Knee bent    Functional Squat 10 reps    Other Standing Knee Exercises sidestep front of mat 2RT    Other Standing Knee Exercises partial tandem stance 2x 20"      Knee/Hip Exercises: Seated   Sit to Sand 10 reps;without UE support   19in height, eccentric control     Knee/Hip Exercises: Supine   Short Arc Quad Sets 15 reps    Short Arc Quad Sets Limitations 5" holds    Bridges 2 sets;10 reps      Knee/Hip Exercises: Sidelying   Hip ABduction Left;AAROM;Right;AROM;10 reps    Hip ABduction Limitations 5" lowering    Clams 10x 5"                    PT Short Term Goals - 11/06/20 1649      PT SHORT TERM GOAL #1   Title Patient will be independent in self management strategies to improve quality of life and functional outcomes.    Time 4    Period Weeks    Status New    Target Date 11/17/20      PT SHORT TERM GOAL #2   Title Patient will be able to report at least 25% improvement in overall mobility.    Time 4    Period Weeks    Status On-going    Target Date 11/17/20      PT SHORT TERM GOAL #3   Title Patient's wound will be reduced in volume by at least 25% to demonstrate improved wound healing    Baseline 2.3 cm^3    Time 4    Period Weeks    Status On-going    Target Date 11/17/20             PT Long Term Goals - 11/06/20 1650      PT LONG TERM GOAL #1   Title Patient will be able to ambulate at least 100 feet with RW in 2 minutes to demonstrate improved ambulatory endurance    Time 8    Period Weeks    Status On-going      PT LONG TERM GOAL #2  Title Patient's wound will reduce in overall volume byt at least 50% to demonstrate improved wound healing    Baseline 2.3 cm^3    Time 8    Period Weeks    Status On-going      PT LONG TERM GOAL #3   Title Patient will be able to demonstate 5-110 degrees in left knee to demonstrate improved knee mobility    Time 8    Period Weeks     Status On-going      PT LONG TERM GOAL #4   Status On-going      PT LONG TERM GOAL #5   Status On-going                 Plan - 11/12/20 1235    Clinical Impression Statement Pt continues to display significant weakness Bil gluteal mm.  AAROM required iwht sidelying abduction to reduce compensation with hip flexor mm.  Session focus with gluteal and quad strengthening to address Bil knee extension.  Pt encouraged to begin heel prop on coffee table at night to assist iwth knee extension.  Pt stated he spilled coffee on current copy of HEP, given new printout.  Added squats for functional strengthning and continues wiht marching and sidestepping for balance training.  Required intermittent HHA during static balance activities today.    Personal Factors and Comorbidities Comorbidity 1;Comorbidity 3+;Comorbidity 2    Comorbidities CHF, left knee surgery, right chronic foot wound, DB, restless leg syndromes    Examination-Activity Limitations Bathing;Lift;Locomotion Level;Transfers;Stand;Stairs;Squat    Examination-Participation Restrictions Community Activity;Meal Prep    Stability/Clinical Decision Making Evolving/Moderate complexity    Clinical Decision Making Moderate    Rehab Potential Fair    PT Frequency 2x / week    PT Duration 8 weeks    PT Treatment/Interventions ADLs/Self Care Home Management;Other (comment);Moist Heat;Balance training;Therapeutic exercise;Therapeutic activities;Functional mobility training;Stair training;Gait training;DME Instruction;Ultrasound;Neuromuscular re-education;Patient/family education;Orthotic Fit/Training;Manual techniques;Taping;Passive range of motion;Electrical Stimulation    PT Next Visit Plan Progress LE strength and begin to work on stability as able. Continue with debridment and dressing changes. Update HEP as needed. Begin standing exercises including static balance challenges    PT Home Exercise Plan calf stretch with towel, bridges.  2/8:  bridge, SLR, hip abduction, hip extension   2/10:  sit to stands; 2/17:  LAQ with wt, supine hip abduction, sidelying hip abduction, 2/21 standing hip abd           Patient will benefit from skilled therapeutic intervention in order to improve the following deficits and impairments:  Abnormal gait,Decreased endurance,Decreased skin integrity,Increased edema,Decreased activity tolerance,Decreased balance,Decreased mobility,Decreased knowledge of use of DME,Decreased strength,Difficulty walking,Pain,Decreased range of motion,Impaired flexibility  Visit Diagnosis: Difficulty in walking, not elsewhere classified  Diabetic ulcer of right midfoot associated with diabetes mellitus due to underlying condition, limited to breakdown of skin ([place])  Chronic pain of left knee     Problem List Patient Active Problem List   Diagnosis Date Noted  . Gait abnormality 09/30/2020  . Mild non proliferative diabetic retinopathy ([place]) 07/16/2020  . Need for immunization against influenza 06/03/2020  . S/P left knee arthroscopy 05/06/20 05/13/2020  . Abnormal MRI 03/26/2020  . Joint disorder of knee 03/26/2020  . Fall at home, initial encounter 03/13/2020  . Anemia 09/03/2019  . Reflux esophagitis 09/03/2019  . Esophageal dysphagia 05/30/2019  . Constipation 05/30/2019  . Positive colorectal cancer screening using Cologuard test 05/30/2019  . Diabetic foot ulcer ([place]) 09/06/2018  . Congestive heart failure ([place]) 05/17/2016  .  Bilateral carotid artery disease ([place]) 07/08/2014  . Obstructive sleep apnea 02/12/2014  . Restless leg syndrome 10/23/2013  . Overweight with body mass index (BMI) of 29 to 29.9 in adult 10/23/2013  . Hyperlipidemia 07/16/2013  . Peripheral arterial disease ([place]) 07/16/2013  . Hypertension 03/14/2012  . Type 2 diabetes mellitus with circulatory disorder ([place]) 03/14/2012  . Peripheral neuropathy 03/14/2012  . Cardiomyopathy- EF NL 01/2013, now 30-35% echo 03/14/2012   [name], LPTA/CLT; CBIS [phone]  [name] 11/12/2020, 1:10 PM  [place], [place], [address] Phone: [phone]   Fax:  [phone]  Name: [name] MRN: [id number] Date of Birth: 10/23/1945

## 2020-11-12 NOTE — Patient Instructions (Signed)
Clam Shell 45 Degrees    Lying with hips and knees bent 45, one pillow between knees and ankles. Lift knee.  Be sure pelvis does not roll backward. Do not arch back. Do 10 times, each leg, 3 times per day.  http://ss.exer.us/74   Copyright  VHI. All rights reserved.   Abduction: Side Leg Lift (Eccentric) - Side-Lying    Lie on side. Lift top leg slightly higher than shoulder level. Keep top leg straight with body, toes pointing forward. Slowly lower for 3-5 seconds. 10 reps per set, 3 sets per day, 5 days per week.  http://ecce.exer.us/62   Copyright  VHI. All rights reserved.   Bridging    Slowly raise buttocks from floor, keeping stomach tight. Repeat 10 times per set. Do 3 sets per session.   http://orth.exer.us/1096   Copyright  VHI. All rights reserved.   Functional Quadriceps: Sit to Stand    Sit on edge of chair, feet flat on floor. Stand upright, extending knees fully. Repeat 10 times per set. Do 3 sets per session.  http://orth.exer.us/734   Copyright  VHI. All rights reserved.

## 2020-11-14 ENCOUNTER — Ambulatory Visit (HOSPITAL_COMMUNITY): Payer: HMO | Admitting: Physical Therapy

## 2020-11-17 ENCOUNTER — Other Ambulatory Visit: Payer: Self-pay

## 2020-11-17 ENCOUNTER — Ambulatory Visit (HOSPITAL_COMMUNITY): Payer: HMO | Admitting: Physical Therapy

## 2020-11-17 DIAGNOSIS — E08621 Diabetes mellitus due to underlying condition with foot ulcer: Secondary | ICD-10-CM

## 2020-11-17 DIAGNOSIS — L97411 Non-pressure chronic ulcer of right heel and midfoot limited to breakdown of skin: Secondary | ICD-10-CM

## 2020-11-17 DIAGNOSIS — R262 Difficulty in walking, not elsewhere classified: Secondary | ICD-10-CM

## 2020-11-17 DIAGNOSIS — G8929 Other chronic pain: Secondary | ICD-10-CM

## 2020-11-17 NOTE — Therapy (Signed)
[place], [place], [address] Phone: [phone]   Fax:  [phone]  Physical Therapy Treatment  Patient Details  Name: [name] MRN: [id number] Date of Birth: 30-Mar-1946 Referring Provider (PT): Perlie Mayo   Encounter Date: 11/17/2020   PT End of Session - 11/17/20 1316    Visit Number 9    Number of Visits 16    Date for PT Re-Evaluation 12/15/20    Authorization Type [employer], no VL, no auth , $15 co pay    Progress Note Due on Visit 10    PT Start Time 1050    PT Stop Time 1225    PT Time Calculation (min) 95 min    Equipment Utilized During Treatment Gait belt    Activity Tolerance Patient tolerated treatment well;Patient limited by fatigue    Behavior During Therapy [facility] for tasks assessed/performed           Past Medical History:  Diagnosis Date  . Aftercare following surgery of the circulatory system, [place] 12/04/2013  . Angina decubitus ([place]) 05/19/2016  . Arrhythmia 05/20/2016  . Arthritis   . ARTHRITIS, RIGHT FOOT 06/26/2008   Qualifier: Diagnosis of  By: Aline Brochure MD, Dorothyann Peng    . Cardiomyopathy- EF NL 01/2013, now 30-35% echo 03/14/2012  . Carotid artery occlusion    left s/p CEA  . Cellulitis of left foot 05/07/2018  . CHF (congestive heart failure) ([place])   . Critical lower limb ischemia (HCC) 02/28/2014   Critical limb ischemia   . Diabetes mellitus   . Elevated troponin 04/11/2016  . GERD (gastroesophageal reflux disease)   . History of stroke June 2013 03/14/2012  . HOH (hard of hearing)   . Hypertension   . Hypothyroidism   . Nonhealing skin ulcer ([place]) 10/28/2014  . Obstructive sleep apnea   . Orthostatic hypotension   . Osteomyelitis ([place])    left great toe  . Pain in the chest 05/17/2016  . Peripheral arterial disease ([place]), s/p PTA x 2 RLE    nonhealing ulcers bilaterally on each great toe  . Pneumonia   . PONV (postoperative nausea and vomiting)   . Pulmonary  nodule 05/17/2016  . Restless leg syndrome   . Shortness of breath   . Stroke [facility]) March 08, 2012  . Toe osteomyelitis, left ([place])   . Ulcer of great toe, left, with necrosis of bone ([place])   . Wears dentures     Past Surgical History:  Procedure Laterality Date  . AMPUTATION TOE Left 07/25/2018   Procedure: AMPUTATION TOE INTERPHALANGEAL HALLUX LEFT;  Surgeon: Evelina Bucy, DPM;  Location: [place];  Service: Podiatry;  Laterality: Left;  . ANGIOPLASTY  02/28/14   diamond back orbital rotational atherectomy of Rt. tibial  . BACK SURGERY    . BONE BIOPSY Left 07/25/2018   Procedure: SUPERFICIAL BONE BIOPSY;  Surgeon: Evelina Bucy, DPM;  Location: [place];  Service: Podiatry;  Laterality: Left;  . CARDIAC CATHETERIZATION N/A 05/19/2016   Procedure: Right/Left Heart Cath and Coronary Angiography;  Surgeon: Belva Crome, MD;  Location: [place] [facility];  Service: Cardiovascular;  Laterality: N/A;  . CERVICAL FUSION    . ENDARTERECTOMY Left 11/29/2013   Procedure: ENDARTERECTOMY CAROTID;  Surgeon: Serafina [place], MD;  Location: [place];  Service: Vascular;  Laterality: Left;  . ESOPHAGOGASTRODUODENOSCOPY  02/2010   Dr. Gala Romney: patient presented with food impaction, schatzki ring with superimposed component of stricture  with erosive reflux esophagitis, s/p disimpaction but dilation planned at later date   . ESOPHAGOGASTRODUODENOSCOPY (EGD) WITH PROPOFOL N/A 08/13/2019   Dr. Gala Romney: Erosive reflux esophagitis with mild stricture and incidental Mallory-Weiss tear which precluded esophageal dilation.  Medium sized hiatal hernia.  [name] ESOPHAGOGASTRODUODENOSCOPY (EGD) WITH PROPOFOL N/A 10/11/2019   Dr. Gala Romney: Esophageal stenosis status post dilation, moderate hiatal hernia  . EYE SURGERY    . FLEXIBLE SIGMOIDOSCOPY N/A 08/13/2019   Procedure: FLEXIBLE SIGMOIDOSCOPY;  Surgeon: [name], MD;  [facility];  Service: Endoscopy;  Laterality: N/A;  colonoscopy aboerted due to formed stool  and poor prep  . FOOT SURGERY    . KNEE ARTHROSCOPY WITH MEDIAL MENISECTOMY Left 05/06/2020   Procedure: KNEE ARTHROSCOPY WITH MEDIAL MENISCECTOMY AND LATERAL MENISCECTOMY;  Surgeon: [name], MD;  [facility];  Service: Orthopedics;  Laterality: Left;  . Lower ext duplex doppler  03/14/14   Rt ABI 1.2  . LOWER EXTREMITY ANGIOGRAM Bilateral 02/18/2014   Procedure: LOWER EXTREMITY ANGIOGRAM;  Surgeon: [name], MD;  Location: [facility] CATH LAB;  Service: Cardiovascular;  Laterality: Bilateral;  . LOWER EXTREMITY ANGIOGRAM N/A 10/31/2014   Procedure: LOWER EXTREMITY ANGIOGRAM;  Surgeon: [name], MD;  [facility] C[facility];  Service: Cardiovascular;  Laterality: N/A;  . Venia Minks DILATION N/A 10/11/2019   Procedure: Venia Minks DILATION;  Surgeon: [name], MD;  [facility];  Service: Endoscopy;  Laterality: N/A;  . MULTIPLE TOOTH EXTRACTIONS    . PV angiogram  02/18/2014   tibial vessel diseas bil.  [name] SPINE SURGERY    . tendon achillies lengthing and sesamoid      There were no vitals filed for this visit.   Subjective Assessment - 11/17/20 1149    Subjective pt states he is doing well other than being tired from no sleeping well due to restless leg.  States he is doing "so so " on his exercises and has some questions on them.    Currently in Pain? No/denies                           Wound Therapy - 11/17/20 1313    Subjective Pt stated he is feeling good, has been compliant with HEP daily.    Patient and Family Stated Goals to have wound to heal and to be able to walk    Prior Treatments wound clinics, neosporin on bandaid but bandaid wound stick.    Evaluation and Treatment Procedures Explained to Patient/Family Yes    Evaluation and Treatment Procedures agreed to    Wound Properties Date First Assessed: 10/20/20 Time First Assessed: 1000 Wound Type: Diabetic ulcer Location: Foot Location Orientation: Anterior;Right Wound Description  (Comments): bottom of foot Present on Admission: Yes   Dressing Type Impregnated gauze (bismuth)    Dressing Changed Changed    Dressing Status Intact    Dressing Change Frequency PRN    Site / Wound Assessment Red;Pink    % Wound base Red or Granulating 100%    Undermining (cm) none after debridement    Margins Other (Comment)    Drainage Amount Scant    Treatment Cleansed;Debridement (Selective)    Selective Debridement - Location edges of wound and callous surrounding wound.    Selective Debridement - Tools Used Forceps;Scissors    Selective Debridement - Tissue Removed callous, devitalized tissue    Wound Therapy - Clinical Statement see below assessment  Wound Therapy - Functional Problem List difficulty walking, washing, dressing    Factors Delaying/Impairing Wound Healing Diabetes Mellitus;Immobility    Wound Therapy - Frequency 2X / week    Wound Therapy - Current Recommendations PT    Wound Plan continue to cleanse and debride to improve approximation and healing; change dressings as needed.    Dressing  xeroform, 2x2, medipore tape, toe wrap, kerlix with #5 netting            [facility] Adult PT Treatment/Exercise - 11/17/20 0001      Knee/Hip Exercises: Standing   Hip Flexion AROM;Stengthening;Both;10 reps;Knee bent;2 sets    Hip Abduction AROM;Stengthening;Knee bent;2 sets;10 reps    Hip Extension AROM;Both;2 sets;10 reps    Other Standing Knee Exercises sidestep front of mat 5RT      Knee/Hip Exercises: Supine   Bridges 2 sets;10 reps    Straight Leg Raises Both;10 reps;2 sets      Knee/Hip Exercises: Sidelying   Hip ABduction Left;AAROM;Right;AROM;10 reps    Hip ABduction Limitations 5" lowering    Clams 10x 5"      Knee/Hip Exercises: Prone   Hamstring Curl 2 sets;10 reps    Hamstring Curl Limitations cues to control movement in straight line    Hip Extension Both;10 reps;2 sets    Other Prone Exercises heelsqueeze 10X5"                    PT  Short Term Goals - 11/06/20 1649      PT SHORT TERM GOAL #1   Title Patient will be independent in self management strategies to improve quality of life and functional outcomes.    Time 4    Period Weeks    Status New    Target Date 11/17/20      PT SHORT TERM GOAL #2   Title Patient will be able to report at least 25% improvement in overall mobility.    Time 4    Period Weeks    Status On-going    Target Date 11/17/20      PT SHORT TERM GOAL #3   Title Patient's wound will be reduced in volume by at least 25% to demonstrate improved wound healing    Baseline 2.3 cm^3    Time 4    Period Weeks    Status On-going    Target Date 11/17/20             PT Long Term Goals - 11/06/20 1650      PT LONG TERM GOAL #1   Title Patient will be able to ambulate at least 100 feet with RW in 2 minutes to demonstrate improved ambulatory endurance    Time 8    Period Weeks    Status On-going      PT LONG TERM GOAL #2   Title Patient's wound will reduce in overall volume byt at least 50% to demonstrate improved wound healing    Baseline 2.3 cm^3    Time 8    Period Weeks    Status On-going      PT LONG TERM GOAL #3   Title Patient will be able to demonstate 5-110 degrees in left knee to demonstrate improved knee mobility    Time 8    Period Weeks    Status On-going      PT LONG TERM GOAL #4   Status On-going      PT LONG TERM GOAL #5   Status On-going  Plan - 11/17/20 1318    Clinical Impression Statement Pt with reported increased weakness today due to being tired; up due to restless leg.  Noted knees more bent today with gait and standing; more cues to reduce shuffling and scissoring gait.    Still also requires constant cues for posturing and general form.   Pt with questions regarding is HEP as he was unsure about how far to step out with sidestepping and if he should not be rocking backward with side lying clams.   Able to clarify this.  Pt  compensates weaknesses through poor posturing rather than focusing on mm control and activation.    Several incidences of drifting off to sleep during session as well.   Dressing was pushed off wound today (due to shuffling) but with much improved approximation and reduced callous and overall healing well.   Minimal debridement required at 9-2 o'clock  to remove undermining in this area to promote approximation.  More medipore used to help secure bandage to foot and prevent sliding.  No toe wraps used as these tend to get too tight in areas.    Personal Factors and Comorbidities Comorbidity 1;Comorbidity 3+;Comorbidity 2    Comorbidities CHF, left knee surgery, right chronic foot wound, DB, restless leg syndromes    Examination-Activity Limitations Bathing;Lift;Locomotion Level;Transfers;Stand;Stairs;Squat    Examination-Participation Restrictions Community Activity;Meal Prep    Stability/Clinical Decision Making Evolving/Moderate complexity    Rehab Potential Fair    PT Frequency 2x / week    PT Duration 8 weeks    PT Treatment/Interventions ADLs/Self Care Home Management;Other (comment);Moist Heat;Balance training;Therapeutic exercise;Therapeutic activities;Functional mobility training;Stair training;Gait training;DME Instruction;Ultrasound;Neuromuscular re-education;Patient/family education;Orthotic Fit/Training;Manual techniques;Taping;Passive range of motion;Electrical Stimulation    PT Next Visit Plan Progress LE strength and begin to work on stability as able. Continue with debridment and dressing changes. Update HEP as needed. Begin standing exercises including static balance challenges    PT Home Exercise Plan calf stretch with towel, bridges.  2/8: bridge, SLR, hip abduction, hip extension   2/10:  sit to stands; 2/17:  LAQ with wt, supine hip abduction, sidelying hip abduction, 2/21 standing hip abd           Patient will benefit from skilled therapeutic intervention in order to improve  the following deficits and impairments:  Abnormal gait,Decreased endurance,Decreased skin integrity,Increased edema,Decreased activity tolerance,Decreased balance,Decreased mobility,Decreased knowledge of use of DME,Decreased strength,Difficulty walking,Pain,Decreased range of motion,Impaired flexibility  Visit Diagnosis: Diabetic ulcer of right midfoot associated with diabetes mellitus due to underlying condition, limited to breakdown of skin ([place])  Chronic pain of left knee  Difficulty in walking, not elsewhere classified     Problem List Patient Active Problem List   Diagnosis Date Noted  . Gait abnormality 09/30/2020  . Mild non proliferative diabetic retinopathy ([place]) 07/16/2020  . Need for immunization against influenza 06/03/2020  . S/P left knee arthroscopy 05/06/20 05/13/2020  . Abnormal MRI 03/26/2020  . Joint disorder of knee 03/26/2020  . Fall at home, initial encounter 03/13/2020  . Anemia 09/03/2019  . Reflux esophagitis 09/03/2019  . Esophageal dysphagia 05/30/2019  . Constipation 05/30/2019  . Positive colorectal cancer screening using Cologuard test 05/30/2019  . Diabetic foot ulcer ([place]) 09/06/2018  . Congestive heart failure ([place]) 05/17/2016  . Bilateral carotid artery disease ([place]) 07/08/2014  . Obstructive sleep apnea 02/12/2014  . Restless leg syndrome 10/23/2013  . Overweight with body mass index (BMI) of 29 to 29.9 in adult 10/23/2013  . Hyperlipidemia 07/16/2013  . Peripheral  arterial disease ([place]) 07/16/2013  . Hypertension 03/14/2012  . Type 2 diabetes mellitus with circulatory disorder ([place]) 03/14/2012  . Peripheral neuropathy 03/14/2012  . Cardiomyopathy- EF NL 01/2013, now 30-35% echo 03/14/2012    Teena Irani 11/17/2020, 1:23 PM  [place], [place], [address] Phone: [phone]   Fax:  [phone]  Name: [name] MRN: [id number] Date of Birth: 05-Aug-1946

## 2020-11-19 ENCOUNTER — Ambulatory Visit (HOSPITAL_COMMUNITY): Payer: HMO | Attending: Family Medicine

## 2020-11-19 ENCOUNTER — Encounter (HOSPITAL_COMMUNITY): Payer: Self-pay

## 2020-11-19 ENCOUNTER — Other Ambulatory Visit: Payer: Self-pay

## 2020-11-19 DIAGNOSIS — G8929 Other chronic pain: Secondary | ICD-10-CM | POA: Diagnosis not present

## 2020-11-19 DIAGNOSIS — L97411 Non-pressure chronic ulcer of right heel and midfoot limited to breakdown of skin: Secondary | ICD-10-CM

## 2020-11-19 DIAGNOSIS — E08621 Diabetes mellitus due to underlying condition with foot ulcer: Secondary | ICD-10-CM | POA: Diagnosis not present

## 2020-11-19 DIAGNOSIS — R262 Difficulty in walking, not elsewhere classified: Secondary | ICD-10-CM | POA: Diagnosis not present

## 2020-11-19 DIAGNOSIS — M25562 Pain in left knee: Secondary | ICD-10-CM | POA: Diagnosis not present

## 2020-11-19 NOTE — Therapy (Addendum)
[facility] [address] [place], [place], [address] Phone: [phone]   Fax:  [phone]  Physical Therapy Treatment and Progress Note  Patient Details  Name: [name] MRN: [id number] Date of Birth: [birth year] Referring Provider (PT): Perlie Mayo  Progress Note Reporting Period 10/20/20 to 11/19/20  See note below for Objective Data and Assessment of Progress/Goals.   2:07 PM, 11/19/20 [name], DPT Physical Therapy with [facility]  [phone] office      Encounter Date: 11/19/2020   PT End of Session - 11/19/20 1101    Visit Number 10    Number of Visits 16    Date for PT Re-Evaluation 12/15/20    Authorization Type [employer], no VL, no auth , $15 co pay    Progress Note Due on Visit 20    PT Start Time 1005    PT Stop Time 1130    PT Time Calculation (min) 85 min    Equipment Utilized During Treatment Gait belt    Activity Tolerance Patient tolerated treatment well;Patient limited by fatigue    Behavior During Therapy WFL for tasks assessed/performed           Past Medical History:  Diagnosis Date  . Aftercare following surgery of the circulatory system, [place] 12/04/2013  . Angina decubitus ([place]) 05/19/2016  . Arrhythmia 05/20/2016  . Arthritis   . ARTHRITIS, RIGHT FOOT 06/26/2008   Qualifier: Diagnosis of  By: Aline Brochure MD, Dorothyann Peng    . Cardiomyopathy- EF NL 01/2013, now 30-35% echo 03/14/2012  . Carotid artery occlusion    left s/p CEA  . Cellulitis of left foot 05/07/2018  . CHF (congestive heart failure) ([place])   . Critical lower limb ischemia (HCC) 02/28/2014   Critical limb ischemia   . Diabetes mellitus   . Elevated troponin 04/11/2016  . GERD (gastroesophageal reflux disease)   . History of stroke June 2013 03/14/2012  . HOH (hard of hearing)   . Hypertension   . Hypothyroidism   . Nonhealing skin ulcer ([place]) 10/28/2014  . Obstructive sleep apnea   . Orthostatic  hypotension   . Osteomyelitis ([place])    left great toe  . Pain in the chest 05/17/2016  . Peripheral arterial disease ([place]), s/p PTA x 2 RLE    nonhealing ulcers bilaterally on each great toe  . Pneumonia   . PONV (postoperative nausea and vomiting)   . Pulmonary nodule 05/17/2016  . Restless leg syndrome   . Shortness of breath   . Stroke [facility]) March 08, 2012  . Toe osteomyelitis, left ([place])   . Ulcer of great toe, left, with necrosis of bone ([place])   . Wears dentures     Past Surgical History:  Procedure Laterality Date  . AMPUTATION TOE Left 07/25/2018   Procedure: AMPUTATION TOE INTERPHALANGEAL HALLUX LEFT;  Surgeon: Evelina Bucy, DPM;  Location: [place];  Service: Podiatry;  Laterality: Left;  . ANGIOPLASTY  02/28/14   diamond back orbital rotational atherectomy of Rt. tibial  . BACK SURGERY    . BONE BIOPSY Left 07/25/2018   Procedure: SUPERFICIAL BONE BIOPSY;  Surgeon: Evelina Bucy, DPM;  Location: [place];  Service: Podiatry;  Laterality: Left;  . CARDIAC CATHETERIZATION N/A 05/19/2016   Procedure: Right/Left Heart Cath and Coronary Angiography;  Surgeon: Belva Crome, MD;  Location: [place] [facility];  Service: Cardiovascular;  Laterality: N/A;  . CERVICAL FUSION    . ENDARTERECTOMY Left  11/29/2013   Procedure: ENDARTERECTOMY CAROTID;  Surgeon: Serafina Mitchell, MD;  Location: [facility] OR;  Service: Vascular;  Laterality: Left;  . ESOPHAGOGASTRODUODENOSCOPY  02/2010   Dr. Gala Romney: patient presented with food impaction, schatzki ring with superimposed component of stricture with erosive reflux esophagitis, s/p disimpaction but dilation planned at later date   . ESOPHAGOGASTRODUODENOSCOPY (EGD) WITH PROPOFOL N/A 08/13/2019   Dr. Gala Romney: Erosive reflux esophagitis with mild stricture and incidental Mallory-Weiss tear which precluded esophageal dilation.  Medium sized hiatal hernia.  [name] ESOPHAGOGASTRODUODENOSCOPY (EGD) WITH PROPOFOL N/A 10/11/2019   Dr. Gala Romney: Esophageal stenosis status post  dilation, moderate hiatal hernia  . EYE SURGERY    . FLEXIBLE SIGMOIDOSCOPY N/A 08/13/2019   Procedure: FLEXIBLE SIGMOIDOSCOPY;  Surgeon: [name], MD;  [facility];  Service: Endoscopy;  Laterality: N/A;  colonoscopy aboerted due to formed stool and poor prep  . FOOT SURGERY    . KNEE ARTHROSCOPY WITH MEDIAL MENISECTOMY Left 05/06/2020   Procedure: KNEE ARTHROSCOPY WITH MEDIAL MENISCECTOMY AND LATERAL MENISCECTOMY;  Surgeon: [name], MD;  Location: [facility];  Service: Orthopedics;  Laterality: Left;  . Lower ext duplex doppler  03/14/14   Rt ABI 1.2  . LOWER EXTREMITY ANGIOGRAM Bilateral 02/18/2014   Procedure: LOWER EXTREMITY ANGIOGRAM;  Surgeon: [name], MD;  Location: [facility] CATH LAB;  Service: Cardiovascular;  Laterality: Bilateral;  . LOWER EXTREMITY ANGIOGRAM N/A 10/31/2014   Procedure: LOWER EXTREMITY ANGIOGRAM;  Surgeon: [name], MD;  Location: [facility] CATH LAB;  Service: Cardiovascular;  Laterality: N/A;  . Venia Minks DILATION N/A 10/11/2019   Procedure: Venia Minks DILATION;  Surgeon: [name], MD;  [facility];  Service: Endoscopy;  Laterality: N/A;  . MULTIPLE TOOTH EXTRACTIONS    . PV angiogram  02/18/2014   tibial vessel diseas bil.  [name] SPINE SURGERY    . tendon achillies lengthing and sesamoid      There were no vitals filed for this visit.   Subjective Assessment - 11/19/20 1100    Subjective Reports wife bought over the counter meds to address restless leg.  Reports he feels 25% improvements with therapy at least, feels more stable and reports at least a month since last fall.    Pertinent History CHF , R foot wound, left knee scope, DB, Restless leg syndrome, hx of stroke.    Patient Stated Goals be able to walk normally and function correctly.    Currently in Pain? No/denies              [facility] PT Assessment - 11/19/20 0001      Assessment   Medical Diagnosis R foot ulcer and gait instability    Referring Provider (PT)  Perlie Mayo    Prior Therapy yes for right wound      Precautions   Precautions None      AROM   AROM Assessment Site Knee    Right Knee Extension 5    Right Knee Flexion 120    Left Knee Extension 18    Left Knee Flexion 123      Strength   Right Hip ABduction 3-/5    Left Hip ABduction 3-/5      Ambulation/Gait   Ambulation/Gait Yes    Ambulation/Gait Assistance 5: Supervision    Ambulation Distance (Feet) 112 Feet    Assistive device Rollator    Gait Pattern Step-to pattern;Decreased weight shift to left;Decreased hip/knee flexion - left;Decreased hip/knee flexion - right;Scissoring;Shuffle  Ambulation Surface Level;Indoor    Gait velocity decreased                       Wound Therapy - 11/19/20 0001    Subjective Reports wife bought over the counter meds to address restless leg.  Reports he feels 25% improvements with therapy at least, feels more stable and reports at least a month since last fall.    Patient and Family Stated Goals to have wound to heal and to be able to walk    Prior Treatments wound clinics, neosporin on bandaid but bandaid wound stick.    Pain Scale 0-10    Pain Score 0-No pain    Evaluation and Treatment Procedures Explained to Patient/Family Yes    Evaluation and Treatment Procedures agreed to    Wound Properties Date First Assessed: 10/20/20 Time First Assessed: 1000 Wound Type: Diabetic ulcer Location: Foot Location Orientation: Anterior;Right Wound Description (Comments): bottom of foot Present on Admission: Yes   Wound Image View All Images View Images    Dressing Type Impregnated gauze (bismuth);Gauze (Comment)   xeroform, vaseline, 2x2, kerlix with medipore tape   Dressing Changed Changed    Dressing Status Intact    Dressing Change Frequency PRN    Site / Wound Assessment Pale;Pink    % Wound base Red or Granulating 100%    Wound Length (cm) 1.3 cm   was 1.6   Wound Width (cm) 1.2 cm   was 1.8   Wound Depth (cm) 0.4 cm    .8   Wound Volume (cm^3) 0.62 cm^3    Wound Surface Area (cm^2) 1.56 cm^2    Margins --   callous removal perimeter   Drainage Amount Scant    Drainage Description Serosanguineous    Treatment Cleansed;Debridement (Selective)    Selective Debridement - Location edges of wound and callous surrounding wound.    Selective Debridement - Tools Used Forceps;Scalpel    Selective Debridement - Tissue Removed callous, devitalized tissue    Wound Therapy - Clinical Statement Pt improved with no restless legs this session, able to debride significant amound of callous whole perimeter.  Wound has reduced in overall volume from 2.3 to .62.  Continues to be pale pink in wound bed.  Continued wiht xeroform, 2x2, medipore tape and kerlix as dressings did not slide up foot following last session.    Wound Therapy - Functional Problem List difficulty walking, washing, dressing    Factors Delaying/Impairing Wound Healing Diabetes Mellitus;Immobility    Wound Therapy - Frequency 2X / week    Wound Therapy - Current Recommendations PT    Wound Plan continue to cleanse and debride to improve approximation and healing; change dressings as needed.    Dressing  xeroform, 2x2, medipore tape, kerlix with #5 netting            [facility] Adult PT Treatment/Exercise - 11/19/20 0001      Ambulation/Gait   Gait Comments 2MWT, improved foot clearance      Knee/Hip Exercises: Standing   Hip Abduction AROM;Stengthening;Knee bent;2 sets;10 reps    Hip Extension AROM;Both;2 sets;10 reps    Other Standing Knee Exercises tandem stance 2x 30"      Knee/Hip Exercises: Seated   Long Arc Quad 10 reps      Knee/Hip Exercises: Supine   Bridges 2 sets;15 reps      Knee/Hip Exercises: Sidelying   Hip ABduction Left;AAROM;Right;AROM;10 reps    Hip ABduction Limitations 5" lowering  Knee/Hip Exercises: Prone   Hip Extension Both;10 reps;2 sets                    PT Short Term Goals - 11/19/20 1108      PT  SHORT TERM GOAL #1   Title Patient will be independent in self management strategies to improve quality of life and functional outcomes.    Baseline 11/19/20: Reports compliance with HEP daily.    Time 4    Period Weeks    Status On-going      PT SHORT TERM GOAL #2   Title Patient will be able to report at least 25% improvement in overall mobility.    Baseline 11/19/20:  Reports at least 25% improvement    Status Achieved      PT SHORT TERM GOAL #3   Title Patient's wound will be reduced in volume by at least 25% to demonstrate improved wound healing    Baseline was 2.3cm^3, now .40 cm^3    Status Achieved             PT Long Term Goals - 11/19/20 1112      PT LONG TERM GOAL #1   Title Patient will be able to ambulate at least 100 feet with RW in 2 minutes to demonstrate improved ambulatory endurance    Baseline 11/19/20:  2MWT 151ft with rollator, improved foot clearance followng cueing from therapist    Status Achieved      PT LONG TERM GOAL #2   Title Patient's wound will reduce in overall volume byt at least 50% to demonstrate improved wound healing    Baseline .62 cm^3, was 2.3 cm ^3    Status On-going      PT LONG TERM GOAL #3   Title Patient will be able to demonstate 5-110 degrees in left knee to demonstrate improved knee mobility    Baseline 11/19/20: Lt 18-123 degrees, Rt 5-120 degrees    Status On-going                 Plan - 11/19/20 1331    Clinical Impression Statement 10th visit progress note, reviewed goals with objective testing inclued 2MWT, MMT and subjective reports.  Pt presents with vast improvements with gait mechanics with foot clearance and increased distance during 2MWT.  Pt continues to exhibit weakness in gluteal mm and improved ROM with Bil knees.  Reviewed current HEP and encouraged to do daily.  Reviewed form and educated on importance of correct musculature activation during sidelying abd and prone hip extension, verbal and tactile cueing  required to reduce compensation.  Added SAQ to HEP, pt able to demonstrate and recall exercise correclty.  Wounds has reduced from 2.3 to .62cm^3 in overall volume.    Personal Factors and Comorbidities Comorbidity 1;Comorbidity 3+;Comorbidity 2    Comorbidities CHF, left knee surgery, right chronic foot wound, DB, restless leg syndromes    Examination-Activity Limitations Bathing;Lift;Locomotion Level;Transfers;Stand;Stairs;Squat    Examination-Participation Restrictions Community Activity;Meal Prep    Stability/Clinical Decision Making Evolving/Moderate complexity    Clinical Decision Making Moderate    Rehab Potential Fair    PT Frequency 2x / week    PT Duration 8 weeks    PT Treatment/Interventions ADLs/Self Care Home Management;Other (comment);Moist Heat;Balance training;Therapeutic exercise;Therapeutic activities;Functional mobility training;Stair training;Gait training;DME Instruction;Ultrasound;Neuromuscular re-education;Patient/family education;Orthotic Fit/Training;Manual techniques;Taping;Passive range of motion;Electrical Stimulation    PT Next Visit Plan Progress LE strength and begin to work on stability as able. Continue with debridment and dressing  changes. Update HEP as needed. Begin standing exercises including static balance challenges    PT Home Exercise Plan calf stretch with towel, bridges.  2/8: bridge, SLR, hip abduction, hip extension   2/10:  sit to stands; 2/17:  LAQ with wt, supine hip abduction, sidelying hip abduction, 2/21 standing hip abd; 3/2:SAQ           Patient will benefit from skilled therapeutic intervention in order to improve the following deficits and impairments:  Abnormal gait,Decreased endurance,Decreased skin integrity,Increased edema,Decreased activity tolerance,Decreased balance,Decreased mobility,Decreased knowledge of use of DME,Decreased strength,Difficulty walking,Pain,Decreased range of motion,Impaired flexibility  Visit Diagnosis: Chronic  pain of left knee  Difficulty in walking, not elsewhere classified  Diabetic ulcer of right midfoot associated with diabetes mellitus due to underlying condition, limited to breakdown of skin [facility])     Problem List Patient Active Problem List   Diagnosis Date Noted  . Gait abnormality 09/30/2020  . Mild non proliferative diabetic retinopathy ([place]) 07/16/2020  . Need for immunization against influenza 06/03/2020  . S/P left knee arthroscopy 05/06/20 05/13/2020  . Abnormal MRI 03/26/2020  . Joint disorder of knee 03/26/2020  . Fall at home, initial encounter 03/13/2020  . Anemia 09/03/2019  . Reflux esophagitis 09/03/2019  . Esophageal dysphagia 05/30/2019  . Constipation 05/30/2019  . Positive colorectal cancer screening using Cologuard test 05/30/2019  . Diabetic foot ulcer ([place]) 09/06/2018  . Congestive heart failure ([place]) 05/17/2016  . Bilateral carotid artery disease ([place]) 07/08/2014  . Obstructive sleep apnea 02/12/2014  . Restless leg syndrome 10/23/2013  . Overweight with body mass index (BMI) of 29 to 29.9 in adult 10/23/2013  . Hyperlipidemia 07/16/2013  . Peripheral arterial disease ([place]) 07/16/2013  . Hypertension 03/14/2012  . Type 2 diabetes mellitus with circulatory disorder ([place]) 03/14/2012  . Peripheral neuropathy 03/14/2012  . Cardiomyopathy- EF NL 01/2013, now 30-35% echo 03/14/2012   [name], LPTA/CLT; CBIS [phone]  [name] 11/19/2020, 1:43 PM  [facility] [place], [place], [address] Phone: [phone]   Fax:  [phone]  Name: CHANDAN FLY MRN: [id number] Date of Birth: Dec 15, 1945

## 2020-11-21 ENCOUNTER — Ambulatory Visit (HOSPITAL_COMMUNITY): Payer: HMO

## 2020-11-25 ENCOUNTER — Ambulatory Visit (HOSPITAL_COMMUNITY): Payer: HMO

## 2020-11-25 ENCOUNTER — Other Ambulatory Visit: Payer: Self-pay

## 2020-11-25 ENCOUNTER — Encounter (HOSPITAL_COMMUNITY): Payer: Self-pay

## 2020-11-25 DIAGNOSIS — M25562 Pain in left knee: Secondary | ICD-10-CM | POA: Diagnosis not present

## 2020-11-25 DIAGNOSIS — R262 Difficulty in walking, not elsewhere classified: Secondary | ICD-10-CM

## 2020-11-25 DIAGNOSIS — G8929 Other chronic pain: Secondary | ICD-10-CM

## 2020-11-25 DIAGNOSIS — L97411 Non-pressure chronic ulcer of right heel and midfoot limited to breakdown of skin: Secondary | ICD-10-CM

## 2020-11-25 DIAGNOSIS — E08621 Diabetes mellitus due to underlying condition with foot ulcer: Secondary | ICD-10-CM

## 2020-11-25 NOTE — Therapy (Signed)
[place], [place], [address] Phone: [phone]   Fax:  [phone]  Physical Therapy Treatment  Patient Details  Name: [name] MRN: [id number] Date of Birth: 06-May-1946 Referring Provider (PT): Perlie Mayo   Encounter Date: 11/25/2020   PT End of Session - 11/25/20 1309    Visit Number 11    Number of Visits 16    Date for PT Re-Evaluation 12/15/20    Authorization Type [employer], no VL, no auth , $15 co pay    Progress Note Due on Visit 20    PT Start Time 1020   Pt late for apt   PT Stop Time 1130    PT Time Calculation (min) 70 min    Equipment Utilized During Treatment Gait belt    Activity Tolerance Patient tolerated treatment well;Patient limited by fatigue;Patient limited by pain    Behavior During Therapy [facility] for tasks assessed/performed           Past Medical History:  Diagnosis Date  . Aftercare following surgery of the circulatory system, [place] 12/04/2013  . Angina decubitus ([place]) 05/19/2016  . Arrhythmia 05/20/2016  . Arthritis   . ARTHRITIS, RIGHT FOOT 06/26/2008   Qualifier: Diagnosis of  By: Aline Brochure MD, Dorothyann Peng    . Cardiomyopathy- EF NL 01/2013, now 30-35% echo 03/14/2012  . Carotid artery occlusion    left s/p CEA  . Cellulitis of left foot 05/07/2018  . CHF (congestive heart failure) ([place])   . Critical lower limb ischemia (HCC) 02/28/2014   Critical limb ischemia   . Diabetes mellitus   . Elevated troponin 04/11/2016  . GERD (gastroesophageal reflux disease)   . History of stroke June 2013 03/14/2012  . HOH (hard of hearing)   . Hypertension   . Hypothyroidism   . Nonhealing skin ulcer ([place]) 10/28/2014  . Obstructive sleep apnea   . Orthostatic hypotension   . Osteomyelitis ([place])    left great toe  . Pain in the chest 05/17/2016  . Peripheral arterial disease ([place]), s/p PTA x 2 RLE    nonhealing ulcers bilaterally on each great toe  . Pneumonia   . PONV  (postoperative nausea and vomiting)   . Pulmonary nodule 05/17/2016  . Restless leg syndrome   . Shortness of breath   . Stroke [facility]) March 08, 2012  . Toe osteomyelitis, left ([place])   . Ulcer of great toe, left, with necrosis of bone ([place])   . Wears dentures     Past Surgical History:  Procedure Laterality Date  . AMPUTATION TOE Left 07/25/2018   Procedure: AMPUTATION TOE INTERPHALANGEAL HALLUX LEFT;  Surgeon: Evelina Bucy, DPM;  Location: [place];  Service: Podiatry;  Laterality: Left;  . ANGIOPLASTY  02/28/14   diamond back orbital rotational atherectomy of Rt. tibial  . BACK SURGERY    . BONE BIOPSY Left 07/25/2018   Procedure: SUPERFICIAL BONE BIOPSY;  Surgeon: Evelina Bucy, DPM;  Location: [place];  Service: Podiatry;  Laterality: Left;  . CARDIAC CATHETERIZATION N/A 05/19/2016   Procedure: Right/Left Heart Cath and Coronary Angiography;  Surgeon: Belva Crome, MD;  Location: [place] CV LAB;  Service: Cardiovascular;  Laterality: N/A;  . CERVICAL FUSION    . ENDARTERECTOMY Left 11/29/2013   Procedure: ENDARTERECTOMY CAROTID;  Surgeon: Serafina Mitchell, MD;  Location: [place];  Service: Vascular;  Laterality: Left;  . ESOPHAGOGASTRODUODENOSCOPY  02/2010   Dr. Gala Romney: patient presented with food  impaction, schatzki ring with superimposed component of stricture with erosive reflux esophagitis, s/p disimpaction but dilation planned at later date   . ESOPHAGOGASTRODUODENOSCOPY (EGD) WITH PROPOFOL N/A 08/13/2019   Dr. Gala Romney: Erosive reflux esophagitis with mild stricture and incidental Mallory-Weiss tear which precluded esophageal dilation.  Medium sized hiatal hernia.  [name] ESOPHAGOGASTRODUODENOSCOPY (EGD) WITH PROPOFOL N/A 10/11/2019   Dr. Gala Romney: Esophageal stenosis status post dilation, moderate hiatal hernia  . EYE SURGERY    . FLEXIBLE SIGMOIDOSCOPY N/A 08/13/2019   Procedure: FLEXIBLE SIGMOIDOSCOPY;  Surgeon: [name], MD;  [facility];  Service: Endoscopy;   Laterality: N/A;  colonoscopy aboerted due to formed stool and poor prep  . FOOT SURGERY    . KNEE ARTHROSCOPY WITH MEDIAL MENISECTOMY Left 05/06/2020   Procedure: KNEE ARTHROSCOPY WITH MEDIAL MENISCECTOMY AND LATERAL MENISCECTOMY;  Surgeon: [name], MD;  Location: [facility];  Service: Orthopedics;  Laterality: Left;  . Lower ext duplex doppler  03/14/14   Rt ABI 1.2  . LOWER EXTREMITY ANGIOGRAM Bilateral 02/18/2014   Procedure: LOWER EXTREMITY ANGIOGRAM;  Surgeon: [name], MD;  Location: [facility] CATH LAB;  Service: Cardiovascular;  Laterality: Bilateral;  . LOWER EXTREMITY ANGIOGRAM N/A 10/31/2014   Procedure: LOWER EXTREMITY ANGIOGRAM;  Surgeon: [name], MD;  Location: [facility] CATH LAB;  Service: Cardiovascular;  Laterality: N/A;  . Venia Minks DILATION N/A 10/11/2019   Procedure: Venia Minks DILATION;  Surgeon: [name], MD;  [facility];  Service: Endoscopy;  Laterality: N/A;  . MULTIPLE TOOTH EXTRACTIONS    . PV angiogram  02/18/2014   tibial vessel diseas bil.  [name] SPINE SURGERY    . tendon achillies lengthing and sesamoid      There were no vitals filed for this visit.   Subjective Assessment - 11/25/20 1235    Subjective Pt reports he had a fall last night.  Reports he fell asleep at the table following dinner and stood up but feet hadn't woken yet.  Landed on Lt shoulder wiht increased pain today.  Arrived with rollator today.    Pertinent History CHF , R foot wound, left knee scope, DB, Restless leg syndrome, hx of stroke.    Patient Stated Goals be able to walk normally and function correctly.    Currently in Pain? Yes    Pain Score 7     Pain Location Shoulder    Pain Orientation Left    Pain Descriptors / Indicators Sore;Aching    Pain Type Acute pain    Pain Onset Yesterday    Pain Frequency Constant    Aggravating Factors  following fall 11/24/20                           Wound Therapy - 11/25/20 1235    Subjective Pt reports he  had a fall last night.  Reports he fell asleep at the table following dinner and stood up but feet hadn't woken yet.  Landed on Lt shoulder wiht increased pain today.  Arrived with rollator today.    Patient and Family Stated Goals to have wound to heal and to be able to walk    Prior Treatments wound clinics, neosporin on bandaid but bandaid wound stick.    Pain Scale 0-10    Evaluation and Treatment Procedures Explained to Patient/Family Yes    Evaluation and Treatment Procedures agreed to    Wound Properties Date First Assessed: 10/20/20 Time First Assessed: 1000  Wound Type: Diabetic ulcer Location: Foot Location Orientation: Anterior;Right Wound Description (Comments): bottom of foot Present on Admission: Yes   Wound Image View All Images View Images    Dressing Type Impregnated gauze (bismuth);Hydrogel;Gauze (Comment)   xeroform, 2x2 hydrogel in wound, medipore tape, kerlix, netting   Dressing Changed Changed    Dressing Status Intact    Dressing Change Frequency PRN    Site / Wound Assessment Pale;Pink    % Wound base Red or Granulating 100%    Wound Length (cm) 1.2 cm    Wound Width (cm) 1 cm    Wound Depth (cm) 0.4 cm    Wound Volume (cm^3) 0.48 cm^3    Wound Surface Area (cm^2) 1.2 cm^2    Margins --   callous perimeter   Drainage Amount Scant    Drainage Description Serosanguineous    Treatment Cleansed;Debridement (Selective)    Selective Debridement - Location edges of wound and callous surrounding wound.    Selective Debridement - Tools Used Forceps;Scalpel    Selective Debridement - Tissue Removed callous, devitalized tissue    Wound Therapy - Clinical Statement Pt overslept and didn't take his restless medication today, increased difficulty with selective debridement this session.  Selective debridement for removal of callous perimeter of wound. Continued wiht xeroform, 2x2, and medipore tape to assist with dressings sliding up.    Wound Therapy - Functional Problem List  difficulty walking, washing, dressing    Factors Delaying/Impairing Wound Healing Diabetes Mellitus;Immobility    Wound Therapy - Frequency 2X / week    Wound Therapy - Current Recommendations PT    Wound Plan continue to cleanse and debride to improve approximation and healing; change dressings as needed.    Dressing  xeroform, 2x2, medipore tape, kerlix with #5 netting            [facility] Adult PT Treatment/Exercise - 11/25/20 0001      Knee/Hip Exercises: Stretches   Active Hamstring Stretch Both;3 reps;60 seconds    Active Hamstring Stretch Limitations long sitting      Knee/Hip Exercises: Standing   Hip Abduction AROM;Stengthening;Knee bent;2 sets;10 reps      Knee/Hip Exercises: Supine   Short Arc [employer] 15 reps    Short Arc Quad Sets Limitations 5" holds    Bridges 2 sets;15 reps                    PT Short Term Goals - 11/19/20 1108      PT SHORT TERM GOAL #1   Title Patient will be independent in self management strategies to improve quality of life and functional outcomes.    Baseline 11/19/20: Reports compliance with HEP daily.    Time 4    Period Weeks    Status On-going      PT SHORT TERM GOAL #2   Title Patient will be able to report at least 25% improvement in overall mobility.    Baseline 11/19/20:  Reports at least 25% improvement    Status Achieved      PT SHORT TERM GOAL #3   Title Patient's wound will be reduced in volume by at least 25% to demonstrate improved wound healing    Baseline was 2.3cm^3, now .12 cm^3    Status Achieved             PT Long Term Goals - 11/19/20 1112      PT LONG TERM GOAL #1   Title Patient will be able to  ambulate at least 100 feet with RW in 2 minutes to demonstrate improved ambulatory endurance    Baseline 11/19/20:  2MWT 177ft with rollator, improved foot clearance followng cueing from therapist    Status Achieved      PT LONG TERM GOAL #2   Title Patient's wound will reduce in overall volume byt at  least 50% to demonstrate improved wound healing    Baseline .62 cm^3, was 2.3 cm ^3    Status On-going      PT LONG TERM GOAL #3   Title Patient will be able to demonstate 5-110 degrees in left knee to demonstrate improved knee mobility    Baseline 11/19/20: Lt 18-123 degrees, Rt 5-120 degrees    Status On-going                 Plan - 11/25/20 1310    Clinical Impression Statement Session focus with knee mobility as pt continues to ambulate with significant extension lag deficits.  Added long sitting hamstring stretch to HEP.  Improved stride length and foot clearance wiht gait, cueing for increased cadence and heel strike to improve gait mechanics.    Personal Factors and Comorbidities Comorbidity 1;Comorbidity 3+;Comorbidity 2    Comorbidities CHF, left knee surgery, right chronic foot wound, DB, restless leg syndromes    Examination-Activity Limitations Bathing;Lift;Locomotion Level;Transfers;Stand;Stairs;Squat    Examination-Participation Restrictions Community Activity;Meal Prep    Stability/Clinical Decision Making Evolving/Moderate complexity    Clinical Decision Making Moderate    Rehab Potential Fair    PT Frequency 2x / week    PT Duration 8 weeks    PT Treatment/Interventions ADLs/Self Care Home Management;Other (comment);Moist Heat;Balance training;Therapeutic exercise;Therapeutic activities;Functional mobility training;Stair training;Gait training;DME Instruction;Ultrasound;Neuromuscular re-education;Patient/family education;Orthotic Fit/Training;Manual techniques;Taping;Passive range of motion;Electrical Stimulation    PT Next Visit Plan Increase focus wiht knee extension.  Continues LE strength and stability as able.  Update HEP as needed.  Continue standing exercises and balance training.    PT Home Exercise Plan calf stretch with towel, bridges.  2/8: bridge, SLR, hip abduction, hip extension   2/10:  sit to stands; 2/17:  LAQ with wt, supine hip abduction, sidelying hip  abduction, 2/21 standing hip abd; 3/2:SAQ; 3/8: long sitting hamstring strech, hip abduction    Consulted and Agree with Plan of Care Patient           Patient will benefit from skilled therapeutic intervention in order to improve the following deficits and impairments:  Abnormal gait,Decreased endurance,Decreased skin integrity,Increased edema,Decreased activity tolerance,Decreased balance,Decreased mobility,Decreased knowledge of use of DME,Decreased strength,Difficulty walking,Pain,Decreased range of motion,Impaired flexibility  Visit Diagnosis: Chronic pain of left knee  Difficulty in walking, not elsewhere classified  Diabetic ulcer of right midfoot associated with diabetes mellitus due to underlying condition, limited to breakdown of skin [facility])     Problem List Patient Active Problem List   Diagnosis Date Noted  . Gait abnormality 09/30/2020  . Mild non proliferative diabetic retinopathy ([place]) 07/16/2020  . Need for immunization against influenza 06/03/2020  . S/P left knee arthroscopy 05/06/20 05/13/2020  . Abnormal MRI 03/26/2020  . Joint disorder of knee 03/26/2020  . Fall at home, initial encounter 03/13/2020  . Anemia 09/03/2019  . Reflux esophagitis 09/03/2019  . Esophageal dysphagia 05/30/2019  . Constipation 05/30/2019  . Positive colorectal cancer screening using Cologuard test 05/30/2019  . Diabetic foot ulcer ([place]) 09/06/2018  . Congestive heart failure ([place]) 05/17/2016  . Bilateral carotid artery disease ([place]) 07/08/2014  . Obstructive sleep apnea 02/12/2014  .  Restless leg syndrome 10/23/2013  . Overweight with body mass index (BMI) of 29 to 29.9 in adult 10/23/2013  . Hyperlipidemia 07/16/2013  . Peripheral arterial disease ([place]) 07/16/2013  . Hypertension 03/14/2012  . Type 2 diabetes mellitus with circulatory disorder ([place]) 03/14/2012  . Peripheral neuropathy 03/14/2012  . Cardiomyopathy- EF NL 01/2013, now 30-35% echo 03/14/2012   [name],  LPTA/CLT; CBIS [phone] [name] 11/25/2020, 3:25 PM  [place], [place], [address] Phone: [phone]   Fax:  [phone]  Name: [name] MRN: [id number] Date of Birth: 1945/10/05

## 2020-11-25 NOTE — Patient Instructions (Signed)
HIP: Hamstrings - Long Sitting    Place one leg on surface with knee straight. Lean forward keeping back straight.  Hold 30 seconds. 3 reps per set, 3sets per day, Everyday!  Copyright  VHI. All rights reserved.   ABDUCTION: Standing (Active)    Stand tall with both hand assistance facing counter. Abduct leg keeping toes pointed forward, hold 3". Complete 2 sets of 10 repetitions. Perform 2 sessions per day.  [email or web address].exer.[email or web address]   Copyright  VHI. All rights reserved.

## 2020-11-26 ENCOUNTER — Ambulatory Visit (HOSPITAL_COMMUNITY): Payer: HMO | Admitting: Physical Therapy

## 2020-11-27 ENCOUNTER — Encounter (HOSPITAL_COMMUNITY): Payer: Self-pay | Admitting: Physical Therapy

## 2020-11-27 ENCOUNTER — Ambulatory Visit (HOSPITAL_COMMUNITY): Payer: HMO | Admitting: Physical Therapy

## 2020-11-27 ENCOUNTER — Other Ambulatory Visit: Payer: Self-pay

## 2020-11-27 DIAGNOSIS — G8929 Other chronic pain: Secondary | ICD-10-CM

## 2020-11-27 DIAGNOSIS — L97411 Non-pressure chronic ulcer of right heel and midfoot limited to breakdown of skin: Secondary | ICD-10-CM

## 2020-11-27 DIAGNOSIS — M25562 Pain in left knee: Secondary | ICD-10-CM

## 2020-11-27 DIAGNOSIS — R262 Difficulty in walking, not elsewhere classified: Secondary | ICD-10-CM

## 2020-11-27 DIAGNOSIS — E08621 Diabetes mellitus due to underlying condition with foot ulcer: Secondary | ICD-10-CM

## 2020-11-27 NOTE — Therapy (Signed)
[place], [place], [address] Phone: [phone]   Fax:  [phone]  Wound Care Therapy  Patient Details  Name: [name] MRN: [id number] Date of Birth: 01/02/1946 Referring Provider (PT): Perlie Mayo   Encounter Date: 11/27/2020   PT End of Session - 11/27/20 1100    Visit Number 12    Number of Visits 16    Date for PT Re-Evaluation 12/15/20    Authorization Type [employer], no VL, no auth , $15 co pay    Progress Note Due on Visit 20    PT Start Time 0925    PT Stop Time 1040    PT Time Calculation (min) 75 min    Equipment Utilized During Treatment Gait belt    Activity Tolerance Patient tolerated treatment well;Patient limited by fatigue;Patient limited by pain    Behavior During Therapy [facility] for tasks assessed/performed           Past Medical History:  Diagnosis Date  . Aftercare following surgery of the circulatory system, [place] 12/04/2013  . Angina decubitus ([place]) 05/19/2016  . Arrhythmia 05/20/2016  . Arthritis   . ARTHRITIS, RIGHT FOOT 06/26/2008   Qualifier: Diagnosis of  By: Aline Brochure MD, Dorothyann Peng    . Cardiomyopathy- EF NL 01/2013, now 30-35% echo 03/14/2012  . Carotid artery occlusion    left s/p CEA  . Cellulitis of left foot 05/07/2018  . CHF (congestive heart failure) ([place])   . Critical lower limb ischemia (HCC) 02/28/2014   Critical limb ischemia   . Diabetes mellitus   . Elevated troponin 04/11/2016  . GERD (gastroesophageal reflux disease)   . History of stroke June 2013 03/14/2012  . HOH (hard of hearing)   . Hypertension   . Hypothyroidism   . Nonhealing skin ulcer ([place]) 10/28/2014  . Obstructive sleep apnea   . Orthostatic hypotension   . Osteomyelitis ([place])    left great toe  . Pain in the chest 05/17/2016  . Peripheral arterial disease ([place]), s/p PTA x 2 RLE    nonhealing ulcers bilaterally on each great toe  . Pneumonia   . PONV (postoperative nausea and  vomiting)   . Pulmonary nodule 05/17/2016  . Restless leg syndrome   . Shortness of breath   . Stroke [facility]) March 08, 2012  . Toe osteomyelitis, left ([place])   . Ulcer of great toe, left, with necrosis of bone ([place])   . Wears dentures     Past Surgical History:  Procedure Laterality Date  . AMPUTATION TOE Left 07/25/2018   Procedure: AMPUTATION TOE INTERPHALANGEAL HALLUX LEFT;  Surgeon: Evelina Bucy, DPM;  Location: [place];  Service: Podiatry;  Laterality: Left;  . ANGIOPLASTY  02/28/14   diamond back orbital rotational atherectomy of Rt. tibial  . BACK SURGERY    . BONE BIOPSY Left 07/25/2018   Procedure: SUPERFICIAL BONE BIOPSY;  Surgeon: Evelina Bucy, DPM;  Location: [place];  Service: Podiatry;  Laterality: Left;  . CARDIAC CATHETERIZATION N/A 05/19/2016   Procedure: Right/Left Heart Cath and Coronary Angiography;  Surgeon: Belva Crome, MD;  Location: [place] [facility];  Service: Cardiovascular;  Laterality: N/A;  . CERVICAL FUSION    . ENDARTERECTOMY Left 11/29/2013   Procedure: ENDARTERECTOMY CAROTID;  Surgeon: Serafina Mitchell, MD;  Location: [place];  Service: Vascular;  Laterality: Left;  . ESOPHAGOGASTRODUODENOSCOPY  02/2010   Dr. Gala Romney: patient presented with food impaction, schatzki ring with superimposed  component of stricture with erosive reflux esophagitis, s/p disimpaction but dilation planned at later date   . ESOPHAGOGASTRODUODENOSCOPY (EGD) WITH PROPOFOL N/A 08/13/2019   Dr. Gala Romney: Erosive reflux esophagitis with mild stricture and incidental Mallory-Weiss tear which precluded esophageal dilation.  Medium sized hiatal hernia.  [name] ESOPHAGOGASTRODUODENOSCOPY (EGD) WITH PROPOFOL N/A 10/11/2019   Dr. Gala Romney: Esophageal stenosis status post dilation, moderate hiatal hernia  . EYE SURGERY    . FLEXIBLE SIGMOIDOSCOPY N/A 08/13/2019   Procedure: FLEXIBLE SIGMOIDOSCOPY;  Surgeon: [name], MD;  [facility];  Service: Endoscopy;  Laterality: N/A;  colonoscopy  aboerted due to formed stool and poor prep  . FOOT SURGERY    . KNEE ARTHROSCOPY WITH MEDIAL MENISECTOMY Left 05/06/2020   Procedure: KNEE ARTHROSCOPY WITH MEDIAL MENISCECTOMY AND LATERAL MENISCECTOMY;  Surgeon: [name], MD;  Location: [facility];  Service: Orthopedics;  Laterality: Left;  . Lower ext duplex doppler  03/14/14   Rt ABI 1.2  . LOWER EXTREMITY ANGIOGRAM Bilateral 02/18/2014   Procedure: LOWER EXTREMITY ANGIOGRAM;  Surgeon: [name], MD;  Location: [facility] CATH LAB;  Service: Cardiovascular;  Laterality: Bilateral;  . LOWER EXTREMITY ANGIOGRAM N/A 10/31/2014   Procedure: LOWER EXTREMITY ANGIOGRAM;  Surgeon: [name], MD;  Location: [facility] CATH LAB;  Service: Cardiovascular;  Laterality: N/A;  . Venia Minks DILATION N/A 10/11/2019   Procedure: Venia Minks DILATION;  Surgeon: [name], MD;  [facility];  Service: Endoscopy;  Laterality: N/A;  . MULTIPLE TOOTH EXTRACTIONS    . PV angiogram  02/18/2014   tibial vessel diseas bil.  [name] SPINE SURGERY    . tendon achillies lengthing and sesamoid      There were no vitals filed for this visit.    Subjective Assessment - 11/27/20 1046    Subjective Pt states that his shoulder is still sore from the fall and he is having difficulty lifting it.  Therapist recommended wand exercises but stated if it did not feel better in a few days he should see his MD>  No pain in foot    Pertinent History CHF , R foot wound, left knee scope, DB, Restless leg syndrome, hx of stroke.    Patient Stated Goals be able to walk normally and function correctly.    Currently in Pain? Yes    Pain Score 5     Pain Location Shoulder    Pain Orientation Left    Pain Descriptors / Indicators Aching    Pain Type Acute pain    Pain Onset In the past 7 days    Aggravating Factors  from fall                     Wound Therapy - 11/27/20 1046    Subjective see above    Patient and Family Stated Goals to have wound to heal and to be  able to walk    Prior Treatments wound clinics, neosporin on bandaid but bandaid wound stick.    Evaluation and Treatment Procedures Explained to Patient/Family Yes    Evaluation and Treatment Procedures agreed to    Wound Properties Date First Assessed: 10/20/20 Time First Assessed: 1000 Wound Type: Diabetic ulcer Location: Foot Location Orientation: Anterior;Right Wound Description (Comments): bottom of foot Present on Admission: Yes   Dressing Type Impregnated gauze (bismuth)    Dressing Changed Changed    Dressing Status Intact    Dressing Change Frequency PRN    Site / Wound Assessment  Pink    % Wound base Red or Granulating 100%    Peri-wound Assessment --   macerated/callous .5 cm around wound   Wound Length (cm) 1.1 cm    Wound Width (cm) 1.1 cm    Wound Depth (cm) 0.3 cm    Wound Volume (cm^3) 0.36 cm^3    Wound Surface Area (cm^2) 1.21 cm^2    Margins Epibole (rolled edges)    Drainage Amount Moderate    Drainage Description Serous    Treatment Cleansed;Debridement (Selective)    Selective Debridement - Location edges and calloused perimeter; expect wound to get larger with debridement    Selective Debridement - Tools Used Forceps;Scissors    Selective Debridement - Tissue Removed callous devitalized tissue.    Wound Therapy - Clinical Statement Noted improvement in pt gt as he is not adducting his LE as he was.  Pt very eager to complete HEP.  Added functional squat and tband sitting abduction to HEP,  Added balance exercises to pt exercises done in department.    Wound Therapy - Functional Problem List difficulty walking, washing, dressing    Factors Delaying/Impairing Wound Healing Diabetes Mellitus;Immobility    Wound Therapy - Frequency 2X / week    Wound Therapy - Current Recommendations PT    Wound Plan continue with wound care,  Begin more standing balance activities as pt is completing supine exercises at home    Dressing  changed to alginate as perimeter was  macerated f/b 2x2 and medipore tape to keep dressing in place.           [place] Adult PT Treatment/Exercise - 11/27/20 0001      Ambulation/Gait   Ambulation/Gait Yes    Ambulation/Gait Assistance 5: Supervision    Ambulation Distance (Feet) 150 Feet    Assistive device Rollator    Gait Pattern Step-to pattern;Decreased weight shift to left;Decreased hip/knee flexion - left;Decreased hip/knee flexion - right;Scissoring;Shuffle      Knee/Hip Exercises: Standing   Other Standing Knee Exercises toe tap onto 2" step x 10; side step with green t band x 2    Other Standing Knee Exercises marching, tandem stance with head turns      Knee/Hip Exercises: Seated   Long Arc Quad 10 reps    Sit to [employer] 15 reps;without UE support                    PT Short Term Goals - 11/19/20 1108      PT SHORT TERM GOAL #1   Title Patient will be independent in self management strategies to improve quality of life and functional outcomes.    Baseline 11/19/20: Reports compliance with HEP daily.    Time 4    Period Weeks    Status On-going      PT SHORT TERM GOAL #2   Title Patient will be able to report at least 25% improvement in overall mobility.    Baseline 11/19/20:  Reports at least 25% improvement    Status Achieved      PT SHORT TERM GOAL #3   Title Patient's wound will be reduced in volume by at least 25% to demonstrate improved wound healing    Baseline was 2.3cm^3, now .34 cm^3    Status Achieved             PT Long Term Goals - 11/19/20 1112      PT LONG TERM GOAL #1   Title Patient will be able to  ambulate at least 100 feet with RW in 2 minutes to demonstrate improved ambulatory endurance    Baseline 11/19/20:  2MWT 192ft with rollator, improved foot clearance followng cueing from therapist    Status Achieved      PT LONG TERM GOAL #2   Title Patient's wound will reduce in overall volume byt at least 50% to demonstrate improved wound healing    Baseline .62 cm^3, was 2.3  cm ^3    Status On-going      PT LONG TERM GOAL #3   Title Patient will be able to demonstate 5-110 degrees in left knee to demonstrate improved knee mobility    Baseline 11/19/20: Lt 18-123 degrees, Rt 5-120 degrees    Status On-going                 Plan - 11/27/20 1101    Clinical Impression Statement see above    Personal Factors and Comorbidities Comorbidity 1;Comorbidity 3+;Comorbidity 2    Comorbidities CHF, left knee surgery, right chronic foot wound, DB, restless leg syndromes    Examination-Activity Limitations Bathing;Lift;Locomotion Level;Transfers;Stand;Stairs;Squat    Examination-Participation Restrictions Community Activity;Meal Prep    Stability/Clinical Decision Making Evolving/Moderate complexity    Rehab Potential Fair    PT Frequency 2x / week    PT Duration 8 weeks    PT Treatment/Interventions ADLs/Self Care Home Management;Other (comment);Moist Heat;Balance training;Therapeutic exercise;Therapeutic activities;Functional mobility training;Stair training;Gait training;DME Instruction;Ultrasound;Neuromuscular re-education;Patient/family education;Orthotic Fit/Training;Manual techniques;Taping;Passive range of motion;Electrical Stimulation    PT Next Visit Plan focus primarily on  standing exercises and balance training continue wound care.    PT Home Exercise Plan calf stretch with towel, bridges.  2/8: bridge, SLR, hip abduction, hip extension   2/10:  sit to stands; 2/17:  LAQ with wt, supine hip abduction, sidelying hip abduction, 2/21 standing hip abd; 3/2:SAQ; 3/8: long sitting hamstring strech, hip abduction    Consulted and Agree with Plan of Care Patient           Patient will benefit from skilled therapeutic intervention in order to improve the following deficits and impairments:  Abnormal gait,Decreased endurance,Decreased skin integrity,Increased edema,Decreased activity tolerance,Decreased balance,Decreased mobility,Decreased knowledge of use of  DME,Decreased strength,Difficulty walking,Pain,Decreased range of motion,Impaired flexibility  Visit Diagnosis: Chronic pain of left knee  Difficulty in walking, not elsewhere classified  Diabetic ulcer of right midfoot associated with diabetes mellitus due to underlying condition, limited to breakdown of skin [facility])     Problem List Patient Active Problem List   Diagnosis Date Noted  . Gait abnormality 09/30/2020  . Mild non proliferative diabetic retinopathy ([place]) 07/16/2020  . Need for immunization against influenza 06/03/2020  . S/P left knee arthroscopy 05/06/20 05/13/2020  . Abnormal MRI 03/26/2020  . Joint disorder of knee 03/26/2020  . Fall at home, initial encounter 03/13/2020  . Anemia 09/03/2019  . Reflux esophagitis 09/03/2019  . Esophageal dysphagia 05/30/2019  . Constipation 05/30/2019  . Positive colorectal cancer screening using Cologuard test 05/30/2019  . Diabetic foot ulcer ([place]) 09/06/2018  . Congestive heart failure ([place]) 05/17/2016  . Bilateral carotid artery disease ([place]) 07/08/2014  . Obstructive sleep apnea 02/12/2014  . Restless leg syndrome 10/23/2013  . Overweight with body mass index (BMI) of 29 to 29.9 in adult 10/23/2013  . Hyperlipidemia 07/16/2013  . Peripheral arterial disease ([place]) 07/16/2013  . Hypertension 03/14/2012  . Type 2 diabetes mellitus with circulatory disorder ([place]) 03/14/2012  . Peripheral neuropathy 03/14/2012  . Cardiomyopathy- EF NL 01/2013, now 30-35% echo 03/14/2012  Rayetta Humphrey, PT CLT [phone] 11/27/2020, 11:03 AM  [place], [place], [address] Phone: [phone]   Fax:  [phone]  Name: [name] MRN: [id number] Date of Birth: [birth year]

## 2020-12-02 ENCOUNTER — Other Ambulatory Visit: Payer: Self-pay | Admitting: Orthopedic Surgery

## 2020-12-02 ENCOUNTER — Ambulatory Visit (HOSPITAL_COMMUNITY): Payer: HMO | Admitting: Physical Therapy

## 2020-12-02 ENCOUNTER — Other Ambulatory Visit: Payer: Self-pay

## 2020-12-02 DIAGNOSIS — G8929 Other chronic pain: Secondary | ICD-10-CM

## 2020-12-02 DIAGNOSIS — R262 Difficulty in walking, not elsewhere classified: Secondary | ICD-10-CM

## 2020-12-02 DIAGNOSIS — E08621 Diabetes mellitus due to underlying condition with foot ulcer: Secondary | ICD-10-CM

## 2020-12-02 DIAGNOSIS — M25562 Pain in left knee: Secondary | ICD-10-CM | POA: Diagnosis not present

## 2020-12-02 DIAGNOSIS — L97411 Non-pressure chronic ulcer of right heel and midfoot limited to breakdown of skin: Secondary | ICD-10-CM

## 2020-12-02 NOTE — Therapy (Signed)
[facility] [address] [place], [place], [address] Phone: [phone]   Fax:  [phone]  Physical Therapy Treatment  Patient Details  Name: [name] MRN: [id number] Date of Birth: 05-03-1946 Referring Provider (PT): Perlie Mayo   Encounter Date: 12/02/2020    Past Medical History:  Diagnosis Date  . Aftercare following surgery of the circulatory system, [place] 12/04/2013  . Angina decubitus ([place]) 05/19/2016  . Arrhythmia 05/20/2016  . Arthritis   . ARTHRITIS, RIGHT FOOT 06/26/2008   Qualifier: Diagnosis of  By: Aline Brochure MD, Dorothyann Peng    . Cardiomyopathy- EF NL 01/2013, now 30-35% echo 03/14/2012  . Carotid artery occlusion    left s/p CEA  . Cellulitis of left foot 05/07/2018  . CHF (congestive heart failure) ([place])   . Critical lower limb ischemia (HCC) 02/28/2014   Critical limb ischemia   . Diabetes mellitus   . Elevated troponin 04/11/2016  . GERD (gastroesophageal reflux disease)   . History of stroke June 2013 03/14/2012  . HOH (hard of hearing)   . Hypertension   . Hypothyroidism   . Nonhealing skin ulcer ([place]) 10/28/2014  . Obstructive sleep apnea   . Orthostatic hypotension   . Osteomyelitis ([place])    left great toe  . Pain in the chest 05/17/2016  . Peripheral arterial disease ([place]), s/p PTA x 2 RLE    nonhealing ulcers bilaterally on each great toe  . Pneumonia   . PONV (postoperative nausea and vomiting)   . Pulmonary nodule 05/17/2016  . Restless leg syndrome   . Shortness of breath   . Stroke [facility]) March 08, 2012  . Toe osteomyelitis, left ([place])   . Ulcer of great toe, left, with necrosis of bone ([place])   . Wears dentures     Past Surgical History:  Procedure Laterality Date  . AMPUTATION TOE Left 07/25/2018   Procedure: AMPUTATION TOE INTERPHALANGEAL HALLUX LEFT;  Surgeon: Evelina Bucy, DPM;  Location: [facility];  Service: Podiatry;  Laterality: Left;  . ANGIOPLASTY  02/28/14   diamond back orbital rotational  atherectomy of Rt. tibial  . BACK SURGERY    . BONE BIOPSY Left 07/25/2018   Procedure: SUPERFICIAL BONE BIOPSY;  Surgeon: Evelina Bucy, DPM;  Location: [place] [facility];  Service: Podiatry;  Laterality: Left;  . CARDIAC CATHETERIZATION N/A 05/19/2016   Procedure: Right/Left Heart Cath and Coronary Angiography;  Surgeon: Belva Crome, MD;  Location: [facility];  Service: Cardiovascular;  Laterality: N/A;  . CERVICAL FUSION    . ENDARTERECTOMY Left 11/29/2013   Procedure: ENDARTERECTOMY CAROTID;  Surgeon: Serafina Mitchell, MD;  Location: [facility] OR;  Service: Vascular;  Laterality: Left;  . ESOPHAGOGASTRODUODENOSCOPY  02/2010   Dr. Gala Romney: patient presented with food impaction, schatzki ring with superimposed component of stricture with erosive reflux esophagitis, s/p disimpaction but dilation planned at later date   . ESOPHAGOGASTRODUODENOSCOPY (EGD) WITH PROPOFOL N/A 08/13/2019   Dr. Gala Romney: Erosive reflux esophagitis with mild stricture and incidental Mallory-Weiss tear which precluded esophageal dilation.  Medium sized hiatal hernia.  [name] ESOPHAGOGASTRODUODENOSCOPY (EGD) WITH PROPOFOL N/A 10/11/2019   Dr. Gala Romney: Esophageal stenosis status post dilation, moderate hiatal hernia  . EYE SURGERY    . FLEXIBLE SIGMOIDOSCOPY N/A 08/13/2019   Procedure: FLEXIBLE SIGMOIDOSCOPY;  Surgeon: [name], MD;  [facility];  Service: Endoscopy;  Laterality: N/A;  colonoscopy aboerted due to formed stool and poor prep  . FOOT SURGERY    .  KNEE ARTHROSCOPY WITH MEDIAL MENISECTOMY Left 05/06/2020   Procedure: KNEE ARTHROSCOPY WITH MEDIAL MENISCECTOMY AND LATERAL MENISCECTOMY;  Surgeon: [name], MD;  Location: [facility];  Service: Orthopedics;  Laterality: Left;  . Lower ext duplex doppler  03/14/14   Rt ABI 1.2  . LOWER EXTREMITY ANGIOGRAM Bilateral 02/18/2014   Procedure: LOWER EXTREMITY ANGIOGRAM;  Surgeon: [name], MD;  Location: [facility] CATH LAB;  Service: Cardiovascular;  Laterality:  Bilateral;  . LOWER EXTREMITY ANGIOGRAM N/A 10/31/2014   Procedure: LOWER EXTREMITY ANGIOGRAM;  Surgeon: [name], MD;  Location[facility];  Service: Cardiovascular;  Laterality: N/A;  . Venia Minks DILATION N/A 10/11/2019   Procedure: Venia Minks DILATION;  Surgeon: [name], MD;  [facility];  Service: Endoscopy;  Laterality: N/A;  . MULTIPLE TOOTH EXTRACTIONS    . PV angiogram  02/18/2014   tibial vessel diseas bil.  [name] SPINE SURGERY    . tendon achillies lengthing and sesamoid      There were no vitals filed for this visit.   Subjective Assessment - 12/02/20 1003    Subjective Pt states he is doing well today.  Shoulder is still sore but better.  STates he doesn't do his exercises everyday but when he can.    Currently in Pain? No/denies                        Wound care   Wound Therapy - 12/02/20 1004    Subjective see above    Patient and Family Stated Goals to have wound to heal and to be able to walk    Prior Treatments wound clinics, neosporin on bandaid but bandaid wound stick.    Pain Score 0-No pain    Evaluation and Treatment Procedures Explained to Patient/Family Yes    Evaluation and Treatment Procedures agreed to    Wound Properties Date First Assessed: 10/20/20 Time First Assessed: 1000 Wound Type: Diabetic ulcer Location: Foot Location Orientation: Anterior;Right Wound Description (Comments): bottom of foot Present on Admission: Yes   Wound Image View All Images View Images    Dressing Type Impregnated gauze (bismuth)    Dressing Changed Changed    Dressing Status Intact    Dressing Change Frequency PRN    Site / Wound Assessment Pink;Red    % Wound base Red or Granulating 100%    Peri-wound Assessment Maceration    Wound Length (cm) 1 cm    Wound Width (cm) 1 cm    Wound Depth (cm) 0.3 cm    Wound Volume (cm^3) 0.3 cm^3    Wound Surface Area (cm^2) 1 cm^2    Margins Epibole (rolled edges)    Drainage Amount Moderate     Drainage Description Serous    Treatment Cleansed;Debridement (Selective)    Selective Debridement - Location edges and calloused perimeter; expect wound to get larger with debridement    Selective Debridement - Tools Used Forceps;Scissors    Selective Debridement - Tissue Removed callous devitalized tissue.    Wound Therapy - Clinical Statement see below    Wound Therapy - Functional Problem List difficulty walking, washing, dressing    Factors Delaying/Impairing Wound Healing Diabetes Mellitus;Immobility    Wound Therapy - Frequency 2X / week    Wound Therapy - Current Recommendations PT    Wound Plan continue with wound care,  Begin more standing balance activities as pt is completing supine exercises at home    Dressing  changed  to alginate as perimeter was macerated f/b 2x2 and medipore tape to keep dressing in place.          Therex  [facility] Adult PT Treatment/Exercise - 12/02/20 0001      Ambulation/Gait   Ambulation/Gait Yes      Knee/Hip Exercises: Standing   Heel Raises Both;20 reps    Heel Raises Limitations toeraises 20X    Hip Flexion Both;20 reps    Hip Flexion Limitations alternating march    Forward Lunges Both;15 reps;Limitations    Forward Lunges Limitations onto 4" step with  1 HHA    Hip Abduction AROM;Stengthening;Knee bent;2 sets;10 reps    Hip Extension AROM;Both;2 sets;10 reps    Other Standing Knee Exercises toe tap onto 4" step with 1 HHA x 10; side step 2RT in bars with min UE assist      Knee/Hip Exercises: Seated   Long Arc Quad 10 reps                    PT Short Term Goals - 11/19/20 1108      PT SHORT TERM GOAL #1   Title Patient will be independent in self management strategies to improve quality of life and functional outcomes.    Baseline 11/19/20: Reports compliance with HEP daily.    Time 4    Period Weeks    Status On-going      PT SHORT TERM GOAL #2   Title Patient will be able to report at least 25% improvement in overall  mobility.    Baseline 11/19/20:  Reports at least 25% improvement    Status Achieved      PT SHORT TERM GOAL #3   Title Patient's wound will be reduced in volume by at least 25% to demonstrate improved wound healing    Baseline was 2.3cm^3, now .22 cm^3    Status Achieved             PT Long Term Goals - 11/19/20 1112      PT LONG TERM GOAL #1   Title Patient will be able to ambulate at least 100 feet with RW in 2 minutes to demonstrate improved ambulatory endurance    Baseline 11/19/20:  2MWT 142ft with rollator, improved foot clearance followng cueing from therapist    Status Achieved      PT LONG TERM GOAL #2   Title Patient's wound will reduce in overall volume byt at least 50% to demonstrate improved wound healing    Baseline .62 cm^3, was 2.3 cm ^3    Status On-going      PT LONG TERM GOAL #3   Title Patient will be able to demonstate 5-110 degrees in left knee to demonstrate improved knee mobility    Baseline 11/19/20: Lt 18-123 degrees, Rt 5-120 degrees    Status On-going                 Plan - 12/02/20 1028    Clinical Impression Statement Began session today with woundcare and followed up with LE strengthening and balance activities. Increased maceration around edges approx 1cm and increased drainage.  Wound bed remains 100% granulated with some approximation of edges of wound but maceration preventing good approximation.  Cleansed well, applied vaseline perimeter and used more alginate, gauze to wound.  Small piece of xerform placed in would bed.   All therex completed in standing this session.  Pt able to maintain upright posturing with cues.  Noted weakness Lt quad>Rt  quad.  Some pain reported in Rt knee when completing Lt abd/ext from full weight bearing.  Good performance of sit to stands with eccentric control from standard height.  Worked on side stepping in parallel bars with min UE assist.  Glutes remain weak with cues to fire and reduce hip drop/pressure on  knees.    Personal Factors and Comorbidities Comorbidity 1;Comorbidity 3+;Comorbidity 2    Comorbidities CHF, left knee surgery, right chronic foot wound, DB, restless leg syndromes    Examination-Activity Limitations Bathing;Lift;Locomotion Level;Transfers;Stand;Stairs;Squat    Examination-Participation Restrictions Community Activity;Meal Prep    Stability/Clinical Decision Making Evolving/Moderate complexity    Rehab Potential Fair    PT Frequency 2x / week    PT Duration 8 weeks    PT Treatment/Interventions ADLs/Self Care Home Management;Other (comment);Moist Heat;Balance training;Therapeutic exercise;Therapeutic activities;Functional mobility training;Stair training;Gait training;DME Instruction;Ultrasound;Neuromuscular re-education;Patient/family education;Orthotic Fit/Training;Manual techniques;Taping;Passive range of motion;Electrical Stimulation    PT Next Visit Plan focus primarily on  standing exercises and balance training continue wound care.  F/U on increased frequency of HEP compliance; work on improving glute strength.    PT Home Exercise Plan calf stretch with towel, bridges.  2/8: bridge, SLR, hip abduction, hip extension   2/10:  sit to stands; 2/17:  LAQ with wt, supine hip abduction, sidelying hip abduction, 2/21 standing hip abd; 3/2:SAQ; 3/8: long sitting hamstring strech, hip abduction    Consulted and Agree with Plan of Care Patient           Patient will benefit from skilled therapeutic intervention in order to improve the following deficits and impairments:  Abnormal gait,Decreased endurance,Decreased skin integrity,Increased edema,Decreased activity tolerance,Decreased balance,Decreased mobility,Decreased knowledge of use of DME,Decreased strength,Difficulty walking,Pain,Decreased range of motion,Impaired flexibility  Visit Diagnosis: No diagnosis found.     Problem List Patient Active Problem List   Diagnosis Date Noted  . Gait abnormality 09/30/2020  .  Mild non proliferative diabetic retinopathy ([place]) 07/16/2020  . Need for immunization against influenza 06/03/2020  . S/P left knee arthroscopy 05/06/20 05/13/2020  . Abnormal MRI 03/26/2020  . Joint disorder of knee 03/26/2020  . Fall at home, initial encounter 03/13/2020  . Anemia 09/03/2019  . Reflux esophagitis 09/03/2019  . Esophageal dysphagia 05/30/2019  . Constipation 05/30/2019  . Positive colorectal cancer screening using Cologuard test 05/30/2019  . Diabetic foot ulcer ([place]) 09/06/2018  . Congestive heart failure ([place]) 05/17/2016  . Bilateral carotid artery disease ([place]) 07/08/2014  . Obstructive sleep apnea 02/12/2014  . Restless leg syndrome 10/23/2013  . Overweight with body mass index (BMI) of 29 to 29.9 in adult 10/23/2013  . Hyperlipidemia 07/16/2013  . Peripheral arterial disease ([place]) 07/16/2013  . Hypertension 03/14/2012  . Type 2 diabetes mellitus with circulatory disorder ([place]) 03/14/2012  . Peripheral neuropathy 03/14/2012  . Cardiomyopathy- EF NL 01/2013, now 30-35% echo 03/14/2012   Teena Irani, PTA/CLT [phone]  Teena Irani 12/02/2020, 10:30 AM  [facility] [address] [place], [place], [address] Phone: [phone]   Fax:  [phone]  Name: ARIANA CAVENAUGH MRN: [id number] Date of Birth: [birth year]

## 2020-12-03 ENCOUNTER — Other Ambulatory Visit: Payer: Self-pay | Admitting: Family Medicine

## 2020-12-03 NOTE — Telephone Encounter (Signed)
SEND BACK I  M NOT MANAGING HIS PLAVIX

## 2020-12-04 ENCOUNTER — Ambulatory Visit (HOSPITAL_COMMUNITY): Payer: HMO | Admitting: Physical Therapy

## 2020-12-09 ENCOUNTER — Ambulatory Visit (HOSPITAL_COMMUNITY): Payer: HMO

## 2020-12-09 ENCOUNTER — Encounter (HOSPITAL_COMMUNITY): Payer: Self-pay

## 2020-12-09 ENCOUNTER — Other Ambulatory Visit: Payer: Self-pay

## 2020-12-09 DIAGNOSIS — M25562 Pain in left knee: Secondary | ICD-10-CM | POA: Diagnosis not present

## 2020-12-09 DIAGNOSIS — G8929 Other chronic pain: Secondary | ICD-10-CM

## 2020-12-09 DIAGNOSIS — E08621 Diabetes mellitus due to underlying condition with foot ulcer: Secondary | ICD-10-CM

## 2020-12-09 DIAGNOSIS — L97411 Non-pressure chronic ulcer of right heel and midfoot limited to breakdown of skin: Secondary | ICD-10-CM

## 2020-12-09 DIAGNOSIS — R262 Difficulty in walking, not elsewhere classified: Secondary | ICD-10-CM

## 2020-12-09 NOTE — Patient Instructions (Signed)
ABDUCTION: Standing (Active)    Stand, feet flat. Lift right leg out to side.  Complete 2 sets of 10  repetitions. Perform 2 sessions per day.  http://gtsc.exer.us/110   Copyright  VHI. All rights reserved.   Hip Extension (Standing)    Stand with support. Squeeze pelvic floor and hold. Move right leg backward with straight knee.  Hold for 5 seconds.  Repeat 10 times. Do 2 times a day. Repeat with other leg..   Copyright  VHI. All rights reserved.

## 2020-12-09 NOTE — Therapy (Signed)
[facility] [address] [place], [place], [address] Phone: [phone]   Fax:  [phone]  Physical Therapy Treatment  Patient Details  Name: [name] MRN: [id number] Date of Birth: Feb 01, 1946 Referring Provider (PT): Perlie Mayo   Encounter Date: 12/09/2020   PT End of Session - 12/09/20 1005    Visit Number 14    Number of Visits 16    Date for PT Re-Evaluation 12/15/20    Authorization Type [employer], no VL, no auth , $15 co pay    Progress Note Due on Visit 20    PT Start Time [phone]   pt late for apt then restroom break   PT Stop Time 1043    PT Time Calculation (min) 65 min    Equipment Utilized During Treatment Gait belt    Activity Tolerance Patient tolerated treatment well;Patient limited by fatigue    Behavior During Therapy [facility] for tasks assessed/performed           Past Medical History:  Diagnosis Date  . Aftercare following surgery of the circulatory system, [place] 12/04/2013  . Angina decubitus ([place]) 05/19/2016  . Arrhythmia 05/20/2016  . Arthritis   . ARTHRITIS, RIGHT FOOT 06/26/2008   Qualifier: Diagnosis of  By: Aline Brochure MD, Dorothyann Peng    . Cardiomyopathy- EF NL 01/2013, now 30-35% echo 03/14/2012  . Carotid artery occlusion    left s/p CEA  . Cellulitis of left foot 05/07/2018  . CHF (congestive heart failure) ([place])   . Critical lower limb ischemia (HCC) 02/28/2014   Critical limb ischemia   . Diabetes mellitus   . Elevated troponin 04/11/2016  . GERD (gastroesophageal reflux disease)   . History of stroke June 2013 03/14/2012  . HOH (hard of hearing)   . Hypertension   . Hypothyroidism   . Nonhealing skin ulcer ([place]) 10/28/2014  . Obstructive sleep apnea   . Orthostatic hypotension   . Osteomyelitis ([place])    left great toe  . Pain in the chest 05/17/2016  . Peripheral arterial disease ([place]), s/p PTA x 2 RLE    nonhealing ulcers bilaterally on each great toe  . Pneumonia   . PONV  (postoperative nausea and vomiting)   . Pulmonary nodule 05/17/2016  . Restless leg syndrome   . Shortness of breath   . Stroke [facility]) March 08, 2012  . Toe osteomyelitis, left ([place])   . Ulcer of great toe, left, with necrosis of bone ([place])   . Wears dentures     Past Surgical History:  Procedure Laterality Date  . AMPUTATION TOE Left 07/25/2018   Procedure: AMPUTATION TOE INTERPHALANGEAL HALLUX LEFT;  Surgeon: Evelina Bucy, DPM;  Location: [facility];  Service: Podiatry;  Laterality: Left;  . ANGIOPLASTY  02/28/14   diamond back orbital rotational atherectomy of Rt. tibial  . BACK SURGERY    . BONE BIOPSY Left 07/25/2018   Procedure: SUPERFICIAL BONE BIOPSY;  Surgeon: Evelina Bucy, DPM;  Location: [place];  Service: Podiatry;  Laterality: Left;  . CARDIAC CATHETERIZATION N/A 05/19/2016   Procedure: Right/Left Heart Cath and Coronary Angiography;  Surgeon: Belva Crome, MD;  Location: [facility];  Service: Cardiovascular;  Laterality: N/A;  . CERVICAL FUSION    . ENDARTERECTOMY Left 11/29/2013   Procedure: ENDARTERECTOMY CAROTID;  Surgeon: Serafina Mitchell, MD;  Location: [facility];  Service: Vascular;  Laterality: Left;  . ESOPHAGOGASTRODUODENOSCOPY  02/2010   Dr. Gala Romney: patient presented with food  impaction, schatzki ring with superimposed component of stricture with erosive reflux esophagitis, s/p disimpaction but dilation planned at later date   . ESOPHAGOGASTRODUODENOSCOPY (EGD) WITH PROPOFOL N/A 08/13/2019   Dr. Gala Romney: Erosive reflux esophagitis with mild stricture and incidental Mallory-Weiss tear which precluded esophageal dilation.  Medium sized hiatal hernia.  [name] ESOPHAGOGASTRODUODENOSCOPY (EGD) WITH PROPOFOL N/A 10/11/2019   Dr. Gala Romney: Esophageal stenosis status post dilation, moderate hiatal hernia  . EYE SURGERY    . FLEXIBLE SIGMOIDOSCOPY N/A 08/13/2019   Procedure: FLEXIBLE SIGMOIDOSCOPY;  Surgeon: [name], MD;  [facility];  Service: Endoscopy;   Laterality: N/A;  colonoscopy aboerted due to formed stool and poor prep  . FOOT SURGERY    . KNEE ARTHROSCOPY WITH MEDIAL MENISECTOMY Left 05/06/2020   Procedure: KNEE ARTHROSCOPY WITH MEDIAL MENISCECTOMY AND LATERAL MENISCECTOMY;  Surgeon: [name], MD;  Location: [facility];  Service: Orthopedics;  Laterality: Left;  . Lower ext duplex doppler  03/14/14   Rt ABI 1.2  . LOWER EXTREMITY ANGIOGRAM Bilateral 02/18/2014   Procedure: LOWER EXTREMITY ANGIOGRAM;  Surgeon: [name], MD;  Location: [facility] CATH [facility];  Service: Cardiovascular;  Laterality: Bilateral;  . LOWER EXTREMITY ANGIOGRAM N/A 10/31/2014   Procedure: LOWER EXTREMITY ANGIOGRAM;  Surgeon: [name], MD;  Location: [facility] CATH LAB;  Service: Cardiovascular;  Laterality: N/A;  . Venia Minks DILATION N/A 10/11/2019   Procedure: Venia Minks DILATION;  Surgeon: [name], MD;  [facility];  Service: Endoscopy;  Laterality: N/A;  . MULTIPLE TOOTH EXTRACTIONS    . PV angiogram  02/18/2014   tibial vessel diseas bil.  [name] SPINE SURGERY    . tendon achillies lengthing and sesamoid      There were no vitals filed for this visit.   Subjective Assessment - 12/09/20 1004    Subjective Pt stated he had a bad cramp Lt ankle last night, stated it lasted almost 20 minutes    Pertinent History CHF , R foot wound, left knee scope, DB, Restless leg syndrome, hx of stroke.    Patient Stated Goals be able to walk normally and function correctly.    Currently in Pain? No/denies                           Wound Therapy - 12/09/20 0001    Subjective Pt stated he had a bad cramp Lt ankle last night, stated it lasted almost 20 minutes    Patient and Family Stated Goals to have wound to heal and to be able to walk    Prior Treatments wound clinics, neosporin on bandaid but bandaid wound stick.    Pain Scale 0-10    Pain Score 0-No pain    Evaluation and Treatment Procedures Explained to Patient/Family Yes     Evaluation and Treatment Procedures agreed to    Wound Properties Date First Assessed: 10/20/20 Time First Assessed: 1000 Wound Type: Diabetic ulcer Location: Foot Location Orientation: Anterior;Right Wound Description (Comments): bottom of foot Present on Admission: Yes   Dressing Type Alginate    Dressing Changed Changed    Dressing Status Intact    Dressing Change Frequency PRN    Site / Wound Assessment Pale;Pink;Red    % Wound base Red or Granulating 100%    Peri-wound Assessment Maceration    Margins Epibole (rolled edges)    Drainage Amount Moderate    Drainage Description Serous    Treatment Cleansed;Debridement (Selective)  Selective Debridement - Location edges and calloused perimeter; expect wound to get larger with debridement    Selective Debridement - Tools Used Forceps;Scissors    Selective Debridement - Tissue Removed callous devitalized tissue.    Wound Therapy - Clinical Statement Maceration around wound, changed dressings to alginate to improve skin integirty.  Selective debridment for removal of devitalized tissues perimeter of wound.  Wound bed 100% pale tissue.    Wound Therapy - Functional Problem List difficulty walking, washing, dressing    Factors Delaying/Impairing Wound Healing Diabetes Mellitus;Immobility    Wound Therapy - Frequency 2X / week    Wound Therapy - Current Recommendations PT    Wound Plan continue with wound care,  Begin more standing balance activities as pt is completing supine exercises at home    Dressing  alginate, 2x2, medipore tape            [facility] Adult PT Treatment/Exercise - 12/09/20 0001      Knee/Hip Exercises: Standing   Heel Raises Both;20 reps    Heel Raises Limitations toeraises 20X    Hip Flexion Both;20 reps    Hip Flexion Limitations alternating march    Hip Abduction AROM;Stengthening;Knee bent;2 sets;10 reps    Hip Extension AROM;Both;2 sets;10 reps    Functional Squat 2 sets;10 reps    Functional Squat Limitations  rollator behind, UE support, cueing for mehcanics    Other Standing Knee Exercises RTB shoulder extension and rows (cueing for knee extension, posture, ab set)      Knee/Hip Exercises: Seated   Sit to Sand 15 reps;without UE support                    PT Short Term Goals - 11/19/20 1108      PT SHORT TERM GOAL #1   Title Patient will be independent in self management strategies to improve quality of life and functional outcomes.    Baseline 11/19/20: Reports compliance with HEP daily.    Time 4    Period Weeks    Status On-going      PT SHORT TERM GOAL #2   Title Patient will be able to report at least 25% improvement in overall mobility.    Baseline 11/19/20:  Reports at least 25% improvement    Status Achieved      PT SHORT TERM GOAL #3   Title Patient's wound will be reduced in volume by at least 25% to demonstrate improved wound healing    Baseline was 2.3cm^3, now .79 cm^3    Status Achieved             PT Long Term Goals - 11/19/20 1112      PT LONG TERM GOAL #1   Title Patient will be able to ambulate at least 100 feet with RW in 2 minutes to demonstrate improved ambulatory endurance    Baseline 11/19/20:  2MWT 125ft with rollator, improved foot clearance followng cueing from therapist    Status Achieved      PT LONG TERM GOAL #2   Title Patient's wound will reduce in overall volume byt at least 50% to demonstrate improved wound healing    Baseline .62 cm^3, was 2.3 cm ^3    Status On-going      PT LONG TERM GOAL #3   Title Patient will be able to demonstate 5-110 degrees in left knee to demonstrate improved knee mobility    Baseline 11/19/20: Lt 18-123 degrees, Rt 5-120 degrees  Status On-going                 Plan - 12/09/20 1058    Clinical Impression Statement Session focus initially with wound care then standing LE strengthening and balance activities.  Pt required cueing for upright posturing though session.  Added standing postural  strengthening with min A for balance and cueing to improve abdominal sets, extend knee and mechanics.    Personal Factors and Comorbidities Comorbidity 1;Comorbidity 3+;Comorbidity 2    Comorbidities CHF, left knee surgery, right chronic foot wound, DB, restless leg syndromes    Examination-Activity Limitations Bathing;Lift;Locomotion Level;Transfers;Stand;Stairs;Squat    Examination-Participation Restrictions Community Activity;Meal Prep    Stability/Clinical Decision Making Evolving/Moderate complexity    Clinical Decision Making Moderate    Rehab Potential Fair    PT Frequency 2x / week    PT Duration 8 weeks    PT Treatment/Interventions ADLs/Self Care Home Management;Other (comment);Moist Heat;Balance training;Therapeutic exercise;Therapeutic activities;Functional mobility training;Stair training;Gait training;DME Instruction;Ultrasound;Neuromuscular re-education;Patient/family education;Orthotic Fit/Training;Manual techniques;Taping;Passive range of motion;Electrical Stimulation    PT Next Visit Plan focus primarily on  standing exercises and balance training continue wound care.  F/U on increased frequency of HEP compliance; work on improving glute strength.    PT Home Exercise Plan calf stretch with towel, bridges.  2/8: bridge, SLR, hip abduction, hip extension   2/10:  sit to stands; 2/17:  LAQ with wt, supine hip abduction, sidelying hip abduction, 2/21 standing hip abd; 3/2:SAQ; 3/8: long sitting hamstring strech, hip abduction    Consulted and Agree with Plan of Care Patient           Patient will benefit from skilled therapeutic intervention in order to improve the following deficits and impairments:  Abnormal gait,Decreased endurance,Decreased skin integrity,Increased edema,Decreased activity tolerance,Decreased balance,Decreased mobility,Decreased knowledge of use of DME,Decreased strength,Difficulty walking,Pain,Decreased range of motion,Impaired flexibility  Visit  Diagnosis: Diabetic ulcer of right midfoot associated with diabetes mellitus due to underlying condition, limited to breakdown of skin ([place])  Difficulty in walking, not elsewhere classified  Chronic pain of left knee     Problem List Patient Active Problem List   Diagnosis Date Noted  . Gait abnormality 09/30/2020  . Mild non proliferative diabetic retinopathy ([place]) 07/16/2020  . Need for immunization against influenza 06/03/2020  . S/P left knee arthroscopy 05/06/20 05/13/2020  . Abnormal MRI 03/26/2020  . Joint disorder of knee 03/26/2020  . Fall at home, initial encounter 03/13/2020  . Anemia 09/03/2019  . Reflux esophagitis 09/03/2019  . Esophageal dysphagia 05/30/2019  . Constipation 05/30/2019  . Positive colorectal cancer screening using Cologuard test 05/30/2019  . Diabetic foot ulcer ([place]) 09/06/2018  . Congestive heart failure ([place]) 05/17/2016  . Bilateral carotid artery disease ([place]) 07/08/2014  . Obstructive sleep apnea 02/12/2014  . Restless leg syndrome 10/23/2013  . Overweight with body mass index (BMI) of 29 to 29.9 in adult 10/23/2013  . Hyperlipidemia 07/16/2013  . Peripheral arterial disease ([place]) 07/16/2013  . Hypertension 03/14/2012  . Type 2 diabetes mellitus with circulatory disorder ([place]) 03/14/2012  . Peripheral neuropathy 03/14/2012  . Cardiomyopathy- EF NL 01/2013, now 30-35% echo 03/14/2012    [name], LPTA/CLT; CBIS [phone]  [name] 12/09/2020, 11:15 AM  [facility] [place], [place], [address] Phone: [phone]   Fax:  [phone]  Name: [name] MRN: [id number] Date of Birth: 1946/06/01

## 2020-12-11 ENCOUNTER — Other Ambulatory Visit: Payer: Self-pay

## 2020-12-11 ENCOUNTER — Encounter (HOSPITAL_COMMUNITY): Payer: Self-pay | Admitting: Physical Therapy

## 2020-12-11 ENCOUNTER — Ambulatory Visit (HOSPITAL_COMMUNITY): Payer: HMO | Admitting: Physical Therapy

## 2020-12-11 DIAGNOSIS — E08621 Diabetes mellitus due to underlying condition with foot ulcer: Secondary | ICD-10-CM

## 2020-12-11 DIAGNOSIS — M25562 Pain in left knee: Secondary | ICD-10-CM | POA: Diagnosis not present

## 2020-12-11 DIAGNOSIS — R262 Difficulty in walking, not elsewhere classified: Secondary | ICD-10-CM

## 2020-12-11 DIAGNOSIS — L97411 Non-pressure chronic ulcer of right heel and midfoot limited to breakdown of skin: Secondary | ICD-10-CM

## 2020-12-11 DIAGNOSIS — G8929 Other chronic pain: Secondary | ICD-10-CM

## 2020-12-12 ENCOUNTER — Encounter (HOSPITAL_COMMUNITY): Payer: Self-pay | Admitting: Emergency Medicine

## 2020-12-12 ENCOUNTER — Other Ambulatory Visit: Payer: Self-pay

## 2020-12-12 ENCOUNTER — Emergency Department (HOSPITAL_COMMUNITY)
Admission: EM | Admit: 2020-12-12 | Discharge: 2020-12-12 | Disposition: A | Discharge status: Home / Self Care | Payer: HMO | Attending: Emergency Medicine | Admitting: Emergency Medicine

## 2020-12-12 ENCOUNTER — Emergency Department (HOSPITAL_COMMUNITY): Payer: HMO

## 2020-12-12 DIAGNOSIS — Z85038 Personal history of other malignant neoplasm of large intestine: Secondary | ICD-10-CM | POA: Insufficient documentation

## 2020-12-12 DIAGNOSIS — Z7984 Long term (current) use of oral hypoglycemic drugs: Secondary | ICD-10-CM | POA: Diagnosis not present

## 2020-12-12 DIAGNOSIS — Z7902 Long term (current) use of antithrombotics/antiplatelets: Secondary | ICD-10-CM | POA: Diagnosis not present

## 2020-12-12 DIAGNOSIS — M545 Low back pain, unspecified: Secondary | ICD-10-CM

## 2020-12-12 DIAGNOSIS — Y9301 Activity, walking, marching and hiking: Secondary | ICD-10-CM | POA: Insufficient documentation

## 2020-12-12 DIAGNOSIS — Z79899 Other long term (current) drug therapy: Secondary | ICD-10-CM | POA: Diagnosis not present

## 2020-12-12 DIAGNOSIS — Z87891 Personal history of nicotine dependence: Secondary | ICD-10-CM | POA: Insufficient documentation

## 2020-12-12 DIAGNOSIS — M5440 Lumbago with sciatica, unspecified side: Secondary | ICD-10-CM | POA: Diagnosis not present

## 2020-12-12 DIAGNOSIS — M546 Pain in thoracic spine: Secondary | ICD-10-CM | POA: Diagnosis not present

## 2020-12-12 DIAGNOSIS — M25559 Pain in unspecified hip: Secondary | ICD-10-CM

## 2020-12-12 DIAGNOSIS — E039 Hypothyroidism, unspecified: Secondary | ICD-10-CM | POA: Insufficient documentation

## 2020-12-12 DIAGNOSIS — M25552 Pain in left hip: Secondary | ICD-10-CM | POA: Insufficient documentation

## 2020-12-12 DIAGNOSIS — Z8673 Personal history of transient ischemic attack (TIA), and cerebral infarction without residual deficits: Secondary | ICD-10-CM | POA: Diagnosis not present

## 2020-12-12 DIAGNOSIS — S199XXA Unspecified injury of neck, initial encounter: Secondary | ICD-10-CM | POA: Diagnosis not present

## 2020-12-12 DIAGNOSIS — I110 Hypertensive heart disease with heart failure: Secondary | ICD-10-CM | POA: Diagnosis not present

## 2020-12-12 DIAGNOSIS — R0789 Other chest pain: Secondary | ICD-10-CM | POA: Diagnosis not present

## 2020-12-12 DIAGNOSIS — E1140 Type 2 diabetes mellitus with diabetic neuropathy, unspecified: Secondary | ICD-10-CM | POA: Diagnosis not present

## 2020-12-12 DIAGNOSIS — M25512 Pain in left shoulder: Secondary | ICD-10-CM | POA: Insufficient documentation

## 2020-12-12 DIAGNOSIS — S0990XA Unspecified injury of head, initial encounter: Secondary | ICD-10-CM | POA: Diagnosis not present

## 2020-12-12 DIAGNOSIS — I509 Heart failure, unspecified: Secondary | ICD-10-CM | POA: Insufficient documentation

## 2020-12-12 DIAGNOSIS — W108XXA Fall (on) (from) other stairs and steps, initial encounter: Secondary | ICD-10-CM | POA: Diagnosis not present

## 2020-12-12 DIAGNOSIS — Z7982 Long term (current) use of aspirin: Secondary | ICD-10-CM | POA: Insufficient documentation

## 2020-12-12 DIAGNOSIS — Z043 Encounter for examination and observation following other accident: Secondary | ICD-10-CM | POA: Diagnosis not present

## 2020-12-12 DIAGNOSIS — W19XXXA Unspecified fall, initial encounter: Secondary | ICD-10-CM

## 2020-12-12 MED ORDER — HYDROCODONE-ACETAMINOPHEN 5-325 MG PO TABS: 1.0000 | ORAL_TABLET | Freq: Four times a day (QID) | ORAL | 0 refills | Status: DC | PRN

## 2020-12-12 MED ORDER — LIDOCAINE 5 % EX PTCH: 1.0000 | MEDICATED_PATCH | CUTANEOUS | 0 refills | Status: DC

## 2020-12-12 MED ORDER — ONDANSETRON 4 MG PO TBDP
4.0000 mg | ORAL_TABLET | Freq: Once | ORAL | Status: AC
  Administered 2020-12-12: 4 mg via ORAL
  Filled 2020-12-12: qty 1

## 2020-12-12 MED ORDER — HYDROCODONE-ACETAMINOPHEN 5-325 MG PO TABS
1.0000 | ORAL_TABLET | Freq: Once | ORAL | Status: AC
  Administered 2020-12-12: 1 via ORAL
  Filled 2020-12-12: qty 1

## 2020-12-12 NOTE — ED Triage Notes (Signed)
Pt went downstairs at 0340 this morning. States his left leg gave out, fell to the floor and waking up at 0412. Pt crawled to the steps and was able to get up. C/o of headache and left shoulder pain. Abrasion to head and right side of back.

## 2020-12-12 NOTE — ED Notes (Signed)
Patient transported to CT 

## 2020-12-12 NOTE — Therapy (Addendum)
[place], [place], [address] Phone: [phone]   Fax:  [phone]  Wound Care Therapy  Patient Details  Name: [name] MRN: [id number] Date of Birth: Feb 18, 1946 Referring Provider (PT): Perlie Mayo  Progress Note Reporting Period 11/19/2020 to 12/11/2020  See note below for Objective Data and Assessment of Progress/Goals.      Encounter Date: 12/11/2020   PT End of Session - 12/11/20 1213    Visit Number 15    Number of Visits 26    Date for PT Re-Evaluation 01/15/21    Authorization Type [employer], no VL, no auth , $15 co pay    Progress Note Due on Visit 25    PT Start Time 1107    PT Stop Time 1220    PT Time Calculation (min) 73 min    Equipment Utilized During Treatment Gait belt    Activity Tolerance Patient tolerated treatment well;Patient limited by fatigue    Behavior During Therapy WFL for tasks assessed/performed           Past Medical History:  Diagnosis Date  . Aftercare following surgery of the circulatory system, [place] 12/04/2013  . Angina decubitus ([place]) 05/19/2016  . Arrhythmia 05/20/2016  . Arthritis   . ARTHRITIS, RIGHT FOOT 06/26/2008   Qualifier: Diagnosis of  By: Aline Brochure MD, Dorothyann Peng    . Cardiomyopathy- EF NL 01/2013, now 30-35% echo 03/14/2012  . Carotid artery occlusion    left s/p CEA  . Cellulitis of left foot 05/07/2018  . CHF (congestive heart failure) ([place])   . Critical lower limb ischemia (HCC) 02/28/2014   Critical limb ischemia   . Diabetes mellitus   . Elevated troponin 04/11/2016  . GERD (gastroesophageal reflux disease)   . History of stroke June 2013 03/14/2012  . HOH (hard of hearing)   . Hypertension   . Hypothyroidism   . Nonhealing skin ulcer ([place]) 10/28/2014  . Obstructive sleep apnea   . Orthostatic hypotension   . Osteomyelitis ([place])    left great toe  . Pain in the chest 05/17/2016  . Peripheral arterial disease ([place]), s/p PTA x 2 RLE     nonhealing ulcers bilaterally on each great toe  . Pneumonia   . PONV (postoperative nausea and vomiting)   . Pulmonary nodule 05/17/2016  . Restless leg syndrome   . Shortness of breath   . Stroke [facility]) March 08, 2012  . Toe osteomyelitis, left ([place])   . Ulcer of great toe, left, with necrosis of bone ([place])   . Wears dentures     Past Surgical History:  Procedure Laterality Date  . AMPUTATION TOE Left 07/25/2018   Procedure: AMPUTATION TOE INTERPHALANGEAL HALLUX LEFT;  Surgeon: Evelina Bucy, DPM;  Location: [facility];  Service: Podiatry;  Laterality: Left;  . ANGIOPLASTY  02/28/14   diamond back orbital rotational atherectomy of Rt. tibial  . BACK SURGERY    . BONE BIOPSY Left 07/25/2018   Procedure: SUPERFICIAL BONE BIOPSY;  Surgeon: Evelina Bucy, DPM;  Location: [facility];  Service: Podiatry;  Laterality: Left;  . CARDIAC CATHETERIZATION N/A 05/19/2016   Procedure: Right/Left Heart Cath and Coronary Angiography;  Surgeon: Belva Crome, MD;  Location: [facility];  Service: Cardiovascular;  Laterality: N/A;  . CERVICAL FUSION    . ENDARTERECTOMY Left 11/29/2013   Procedure: ENDARTERECTOMY CAROTID;  Surgeon: Serafina Mitchell, MD;  Location: [facility];  Service: Vascular;  Laterality:  Left;  . ESOPHAGOGASTRODUODENOSCOPY  02/2010   Dr. Gala Romney: patient presented with food impaction, schatzki ring with superimposed component of stricture with erosive reflux esophagitis, s/p disimpaction but dilation planned at later date   . ESOPHAGOGASTRODUODENOSCOPY (EGD) WITH PROPOFOL N/A 08/13/2019   Dr. Gala Romney: Erosive reflux esophagitis with mild stricture and incidental Mallory-Weiss tear which precluded esophageal dilation.  Medium sized hiatal hernia.  [name] ESOPHAGOGASTRODUODENOSCOPY (EGD) WITH PROPOFOL N/A 10/11/2019   Dr. Gala Romney: Esophageal stenosis status post dilation, moderate hiatal hernia  . EYE SURGERY    . FLEXIBLE SIGMOIDOSCOPY N/A 08/13/2019   Procedure: FLEXIBLE SIGMOIDOSCOPY;  Surgeon:  [name], MD;  [facility];  Service: Endoscopy;  Laterality: N/A;  colonoscopy aboerted due to formed stool and poor prep  . FOOT SURGERY    . KNEE ARTHROSCOPY WITH MEDIAL MENISECTOMY Left 05/06/2020   Procedure: KNEE ARTHROSCOPY WITH MEDIAL MENISCECTOMY AND LATERAL MENISCECTOMY;  Surgeon: [name], MD;  Location: [facility];  Service: Orthopedics;  Laterality: Left;  . Lower ext duplex doppler  03/14/14   Rt ABI 1.2  . LOWER EXTREMITY ANGIOGRAM Bilateral 02/18/2014   Procedure: LOWER EXTREMITY ANGIOGRAM;  Surgeon: [name], MD;  Location: [facility];  Service: Cardiovascular;  Laterality: Bilateral;  . LOWER EXTREMITY ANGIOGRAM N/A 10/31/2014   Procedure: LOWER EXTREMITY ANGIOGRAM;  Surgeon: [name], MD;  Location[facility];  Service: Cardiovascular;  Laterality: N/A;  . Venia Minks DILATION N/A 10/11/2019   Procedure: Venia Minks DILATION;  Surgeon: [name], MD;  [facility];  Service: Endoscopy;  Laterality: N/A;  . MULTIPLE TOOTH EXTRACTIONS    . PV angiogram  02/18/2014   tibial vessel diseas bil.  [name] SPINE SURGERY    . tendon achillies lengthing and sesamoid      There were no vitals filed for this visit.    Subjective Assessment - 12/11/20 1116    Subjective PT states that he thinks that he might be overdoing his exercises. He will do them and the next day he is so sore he can not do any exercises.  Therapist and pt spoke of moderation    Pertinent History CHF , R foot wound, left knee scope, DB, Restless leg syndrome, hx of stroke.    Patient Stated Goals be able to walk normally and function correctly.    Pain Score 0-No pain                     Wound Therapy - 12/11/20 1116    Wound Properties Date First Assessed: 10/20/20 Time First Assessed: 1000 Wound Type: Diabetic ulcer Location: Foot Location Orientation: Anterior;Right Wound Description (Comments): bottom of foot Present on Admission: Yes   Dressing Type  Alginate    Dressing Changed Changed    Dressing Status Intact    Dressing Change Frequency PRN    Site / Wound Assessment Clean;Pink    % Wound base Red or Granulating 100%    Peri-wound Assessment --   callous .8cm from 9-3; 1.5 cm 3-9   Wound Length (cm) 1 cm  On 3/2 wasl 1.3   Wound Width (cm) 1 cm  Was 1.2   Wound Depth (cm) 0.2 cm was .4   Wound Volume (cm^3) 0.2 cm^3    Wound Surface Area (cm^2) 1 cm^2    Margins Epibole (rolled edges)    Drainage Amount Minimal    Drainage Description Serous    Treatment Cleansed;Debridement (Selective)    Selective  Debridement - Location edges and calloused perimeter; expect wound to get larger with debridement    Selective Debridement - Tools Used Forceps;Scissors    Selective Debridement - Tissue Removed callous devitalized tissue.    Wound Therapy - Clinical Statement Maceration around wound, changed dressings to alginate to improve skin integirty.  Selective debridment for removal of devitalized tissues perimeter of wound.  Wound bed 100% pale tissue.    Wound Therapy - Functional Problem List difficulty walking, washing, dressing    Factors Delaying/Impairing Wound Healing Diabetes Mellitus;Immobility    Wound Plan continue with wound care,  Begin more standing balance activities as pt is completing supine exercises at home               12/11/20 0001  Strength  Right Hip Extension 3/5 (was 3-)  Right Hip ABduction 3/5 (tested sidelying and supine) on 3/2 was 3-  Left Hip Extension 3/5 (was 3-) at eval   Left Hip ABduction 2+/5 at eval was 3-   Strength Assessment Site Knee  Right Hip Flexion 5/5  Left Hip Flexion 5/5  Right/Left Knee Right;Left  Right Knee Flexion 4/5  Left Knee Flexion 5/5  Right/Left Hip Right;Left  Ambulation/Gait  Ambulation/Gait Yes  Ambulation/Gait Assistance 4: Min assist  Ambulation Distance (Feet) 140 Feet (was 24' at eval; 112 on 3/2)  Assistive device Rollator  Gait Pattern Step-to  pattern;Decreased weight shift to left;Decreased hip/knee flexion - left;Decreased hip/knee flexion - right;Scissoring;Shuffle  Gait velocity decreased  Gait Comments 2MWT, improved foot clearance           PT Short Term Goals - 11/19/20 1108      PT SHORT TERM GOAL #1   Title Patient will be independent in self management strategies to improve quality of life and functional outcomes.    Baseline 11/19/20: Reports compliance with HEP daily.    Time 4    Period Weeks    Status On-going      PT SHORT TERM GOAL #2   Title Patient will be able to report at least 25% improvement in overall mobility.    Baseline 11/19/20:  Reports at least 25% improvement    Status Achieved      PT SHORT TERM GOAL #3   Title Patient's wound will be reduced in volume by at least 25% to demonstrate improved wound healing    Baseline was 2.3cm^3, now .65 cm^3    Status Achieved             PT Long Term Goals - 11/19/20 1112      PT LONG TERM GOAL #1   Title Patient will be able to ambulate at least 100 feet with RW in 2 minutes to demonstrate improved ambulatory endurance    Baseline 11/19/20:  2MWT 160ft with rollator, improved foot clearance followng cueing from therapist    Status Achieved      PT LONG TERM GOAL #2   Title Patient's wound will reduce in overall volume byt at least 50% to demonstrate improved wound healing    Baseline .62 cm^3, was 2.3 cm ^3    Status On-going      PT LONG TERM GOAL #3   Title Patient will be able to demonstate 5-110 degrees in left knee to demonstrate improved knee mobility    Baseline 11/19/20: Lt 18-123 degrees, Rt 5-120 degrees    Status On-going                 Plan -  12/11/20 1120    Clinical Impression Statement PT late for appointment.  Reassessesed.  PT may benefit from an orthotic evaluation for pressure reduction on plantar wound, Pt has shown improvement in gt and strength but continues to fall. HEe will benefit from skilled PT to continue  to maintain a healing enviornment for his wound as well as strengthening, balance and gt training to reduce falls.    Personal Factors and Comorbidities Comorbidity 1;Comorbidity 3+;Comorbidity 2    Comorbidities CHF, left knee surgery, right chronic foot wound, DB, restless leg syndromes    Examination-Activity Limitations Bathing;Lift;Locomotion Level;Transfers;Stand;Stairs;Squat    Examination-Participation Restrictions Community Activity;Meal Prep    Stability/Clinical Decision Making Evolving/Moderate complexity    Rehab Potential Fair    PT Frequency 2x / week    PT Duration 8 weeks   additional 4 weeks to end 4/21/   PT Treatment/Interventions ADLs/Self Care Home Management;Other (comment);Moist Heat;Balance training;Therapeutic exercise;Therapeutic activities;Functional mobility training;Stair training;Gait training;DME Instruction;Ultrasound;Neuromuscular re-education;Patient/family education;Orthotic Fit/Training;Manual techniques;Taping;Passive range of motion;Electrical Stimulation    PT Next Visit Plan focus primarily on  standing exercises and balance training continue wound care.  F/U on increased frequency of HEP compliance; work on improving glute strength.    PT Home Exercise Plan calf stretch with towel, bridges.  2/8: bridge, SLR, hip abduction, hip extension   2/10:  sit to stands; 2/17:  LAQ with wt, supine hip abduction, sidelying hip abduction, 2/21 standing hip abd; 3/2:SAQ; 3/8: long sitting hamstring strech, hip abduction    Consulted and Agree with Plan of Care Patient           Patient will benefit from skilled therapeutic intervention in order to improve the following deficits and impairments:  Abnormal gait,Decreased endurance,Decreased skin integrity,Increased edema,Decreased activity tolerance,Decreased balance,Decreased mobility,Decreased knowledge of use of DME,Decreased strength,Difficulty walking,Pain,Decreased range of motion,Impaired flexibility  Visit  Diagnosis: Diabetic ulcer of right midfoot associated with diabetes mellitus due to underlying condition, limited to breakdown of skin ([place])  Difficulty in walking, not elsewhere classified  Chronic pain of left knee     Problem List Patient Active Problem List   Diagnosis Date Noted  . Gait abnormality 09/30/2020  . Mild non proliferative diabetic retinopathy ([place]) 07/16/2020  . Need for immunization against influenza 06/03/2020  . S/P left knee arthroscopy 05/06/20 05/13/2020  . Abnormal MRI 03/26/2020  . Joint disorder of knee 03/26/2020  . Fall at home, initial encounter 03/13/2020  . Anemia 09/03/2019  . Reflux esophagitis 09/03/2019  . Esophageal dysphagia 05/30/2019  . Constipation 05/30/2019  . Positive colorectal cancer screening using Cologuard test 05/30/2019  . Diabetic foot ulcer ([place]) 09/06/2018  . Congestive heart failure ([place]) 05/17/2016  . Bilateral carotid artery disease ([place]) 07/08/2014  . Obstructive sleep apnea 02/12/2014  . Restless leg syndrome 10/23/2013  . Overweight with body mass index (BMI) of 29 to 29.9 in adult 10/23/2013  . Hyperlipidemia 07/16/2013  . Peripheral arterial disease ([place]) 07/16/2013  . Hypertension 03/14/2012  . Type 2 diabetes mellitus with circulatory disorder ([place]) 03/14/2012  . Peripheral neuropathy 03/14/2012  . Cardiomyopathy- EF NL 01/2013, now 30-35% echo 03/14/2012    Rayetta Humphrey, PT CLT [phone] 12/12/2020, 8:30 AM  [facility] [address] [place], [place], [address] Phone: [phone]   Fax:  [phone]  Name: [name] MRN: [id number] Date of Birth: [birth year]

## 2020-12-12 NOTE — ED Provider Notes (Signed)
[facility] EMERGENCY DEPARTMENT Provider Note   CSN: [id number] Arrival date & time: 12/12/20  1017     History Chief Complaint  Patient presents with  . Loss of Consciousness  . Fall    [name] is a 75 y.o. male past medical history of restless leg syndrome, CHF, GERD, hypertension who presents for evaluation of fall that occurred about 3:40 AM this morning.  He reports that he had gone down to the kitchen to get himself something to eat and states that his restless leg syndrome acted up and caused his leg to jerk.  He states that this caused him to lose his balance and fall.  He says that when he looked at his watch after he had fallen it was 412 so he thinks he hit his head and had LOC.  He reports that he has had pain to his upper back, left shoulder, left hip since then.  He also reports that he has had some nausea but denies any vomiting.  He does not think he is on blood thinners.  He denies any preceding chest pain, dizziness.  He has been able to ambulate on his hip since this occurred but does report pain with ambulating on the left side.  He denies any chest pain, difficulty breathing, abdominal pain, vision changes.   The history is provided by the patient.       Past Medical History:  Diagnosis Date  . Aftercare following surgery of the circulatory system, [place] 12/04/2013  . Angina decubitus ([place]) 05/19/2016  . Arrhythmia 05/20/2016  . Arthritis   . ARTHRITIS, RIGHT FOOT 06/26/2008   Qualifier: Diagnosis of  By: Aline Brochure MD, Dorothyann Peng    . Cardiomyopathy- EF NL 01/2013, now 30-35% echo 03/14/2012  . Carotid artery occlusion    left s/p CEA  . Cellulitis of left foot 05/07/2018  . CHF (congestive heart failure) ([place])   . Critical lower limb ischemia (HCC) 02/28/2014   Critical limb ischemia   . Diabetes mellitus   . Elevated troponin 04/11/2016  . GERD (gastroesophageal reflux disease)   . History of stroke June 2013 03/14/2012  . HOH (hard of hearing)   .  Hypertension   . Hypothyroidism   . Nonhealing skin ulcer ([place]) 10/28/2014  . Obstructive sleep apnea   . Orthostatic hypotension   . Osteomyelitis ([place])    left great toe  . Pain in the chest 05/17/2016  . Peripheral arterial disease ([place]), s/p PTA x 2 RLE    nonhealing ulcers bilaterally on each great toe  . Pneumonia   . PONV (postoperative nausea and vomiting)   . Pulmonary nodule 05/17/2016  . Restless leg syndrome   . Shortness of breath   . Stroke [facility]) March 08, 2012  . Toe osteomyelitis, left ([place])   . Ulcer of great toe, left, with necrosis of bone ([place])   . Wears dentures     Patient Active Problem List   Diagnosis Date Noted  . Gait abnormality 09/30/2020  . Mild non proliferative diabetic retinopathy ([place]) 07/16/2020  . Need for immunization against influenza 06/03/2020  . S/P left knee arthroscopy 05/06/20 05/13/2020  . Abnormal MRI 03/26/2020  . Joint disorder of knee 03/26/2020  . Fall at home, initial encounter 03/13/2020  . Anemia 09/03/2019  . Reflux esophagitis 09/03/2019  . Esophageal dysphagia 05/30/2019  . Constipation 05/30/2019  . Positive colorectal cancer screening using Cologuard test 05/30/2019  . Diabetic foot ulcer ([place]) 09/06/2018  . Congestive heart  failure ([place]) 05/17/2016  . Bilateral carotid artery disease ([place]) 07/08/2014  . Obstructive sleep apnea 02/12/2014  . Restless leg syndrome 10/23/2013  . Overweight with body mass index (BMI) of 29 to 29.9 in adult 10/23/2013  . Hyperlipidemia 07/16/2013  . Peripheral arterial disease ([place]) 07/16/2013  . Hypertension 03/14/2012  . Type 2 diabetes mellitus with circulatory disorder ([place]) 03/14/2012  . Peripheral neuropathy 03/14/2012  . Cardiomyopathy- EF NL 01/2013, now 30-35% echo 03/14/2012    Past Surgical History:  Procedure Laterality Date  . AMPUTATION TOE Left 07/25/2018   Procedure: AMPUTATION TOE INTERPHALANGEAL HALLUX LEFT;  Surgeon: Evelina Bucy, DPM;  Location: [place];  Service:  Podiatry;  Laterality: Left;  . ANGIOPLASTY  02/28/14   diamond back orbital rotational atherectomy of Rt. tibial  . BACK SURGERY    . BONE BIOPSY Left 07/25/2018   Procedure: SUPERFICIAL BONE BIOPSY;  Surgeon: Evelina Bucy, DPM;  Location: [place];  Service: Podiatry;  Laterality: Left;  . CARDIAC CATHETERIZATION N/A 05/19/2016   Procedure: Right/Left Heart Cath and Coronary Angiography;  Surgeon: Belva Crome, MD;  Location: [place] CV LAB;  Service: Cardiovascular;  Laterality: N/A;  . CERVICAL FUSION    . ENDARTERECTOMY Left 11/29/2013   Procedure: ENDARTERECTOMY CAROTID;  Surgeon: Serafina Mitchell, MD;  Location: [facility] OR;  Service: Vascular;  Laterality: Left;  . ESOPHAGOGASTRODUODENOSCOPY  02/2010   Dr. Gala Romney: patient presented with food impaction, schatzki ring with superimposed component of stricture with erosive reflux esophagitis, s/p disimpaction but dilation planned at later date   . ESOPHAGOGASTRODUODENOSCOPY (EGD) WITH PROPOFOL N/A 08/13/2019   Dr. Gala Romney: Erosive reflux esophagitis with mild stricture and incidental Mallory-Weiss tear which precluded esophageal dilation.  Medium sized hiatal hernia.  [name] ESOPHAGOGASTRODUODENOSCOPY (EGD) WITH PROPOFOL N/A 10/11/2019   Dr. Gala Romney: Esophageal stenosis status post dilation, moderate hiatal hernia  . EYE SURGERY    . FLEXIBLE SIGMOIDOSCOPY N/A 08/13/2019   Procedure: FLEXIBLE SIGMOIDOSCOPY;  Surgeon: [name], MD;  [facility];  Service: Endoscopy;  Laterality: N/A;  colonoscopy aboerted due to formed stool and poor prep  . FOOT SURGERY    . KNEE ARTHROSCOPY WITH MEDIAL MENISECTOMY Left 05/06/2020   Procedure: KNEE ARTHROSCOPY WITH MEDIAL MENISCECTOMY AND LATERAL MENISCECTOMY;  Surgeon: [name], MD;  Location: [facility] ORS;  Service: Orthopedics;  Laterality: Left;  . Lower ext duplex doppler  03/14/14   Rt ABI 1.2  . LOWER EXTREMITY ANGIOGRAM Bilateral 02/18/2014   Procedure: LOWER EXTREMITY ANGIOGRAM;  Surgeon:  [name], MD;  Location: [facility] CATH LAB;  Service: Cardiovascular;  Laterality: Bilateral;  . LOWER EXTREMITY ANGIOGRAM N/A 10/31/2014   Procedure: LOWER EXTREMITY ANGIOGRAM;  Surgeon: [name], MD;  Location: [facility] C[facility] LAB;  Service: Cardiovascular;  Laterality: N/A;  . Venia Minks DILATION N/A 10/11/2019   Procedure: Venia Minks DILATION;  Surgeon: [name], MD;  [facility];  Service: Endoscopy;  Laterality: N/A;  . MULTIPLE TOOTH EXTRACTIONS    . PV angiogram  02/18/2014   tibial vessel diseas bil.  [name] SPINE SURGERY    . tendon achillies lengthing and sesamoid         Family History  Problem Relation Age of Onset  . Heart disease Mother   . Hypertension Mother   . Heart attack Mother   . Hypertension Father   . Diabetes Father   . Diabetes Son   . Heart disease Son   . Hypertension Son   .  Colon cancer Neg Hx     Social History   Tobacco Use  . Smoking status: Former Smoker    Years: 1.00    Types: Pipe    Quit date: 07/08/1977    Years since quitting: 43.4  . Smokeless tobacco: Never Used  Vaping Use  . Vaping Use: Never used  Substance Use Topics  . Alcohol use: No    Alcohol/week: 0.0 standard drinks  . Drug use: No    Home Medications Prior to Admission medications   Medication Sig Start Date End Date Taking? Authorizing Provider  HYDROcodone-acetaminophen (NORCO/VICODIN) 5-325 MG tablet Take 1-2 tablets by mouth every 6 (six) hours as needed. 12/12/20  Yes Providence Lanius A, PA-C  lidocaine (LIDODERM) 5 % Place 1 patch onto the skin daily. Remove & Discard patch within 12 hours or as directed by MD 12/12/20  Yes Volanda Napoleon, PA-C  aspirin EC 81 MG EC tablet Take 1 tablet (81 mg total) by mouth daily. Patient taking differently: Take 81 mg by mouth at bedtime. 05/21/16   Barrett, Evelene Croon, PA-C  b complex vitamins tablet Take 1 tablet by mouth daily.    [provider]  carvedilol (COREG) 3.125 MG tablet Take 1 tablet (3.125 mg  total) by mouth 2 (two) times daily with a meal. 12/07/19   [name], MD  Cholecalciferol (VITAMIN D3) 5000 units TABS Take 5,000 Units by mouth 2 (two) times daily.     [provider]  clopidogrel (PLAVIX) 75 MG tablet TAKE ONE (1) TABLET BY MOUTH EVERY DAY 12/03/20   Fayrene Helper, MD  Continuous Blood Gluc Receiver (FREESTYLE LIBRE 14 DAY READER) [place]  12/07/19   [provider]  Continuous Blood Gluc Sensor (FREESTYLE LIBRE 14 DAY SENSOR) [place]  09/11/18   [provider]  Cyanocobalamin 1500 MCG TBDP Take 1,500 mcg by mouth daily.     [provider]  DULoxetine (CYMBALTA) 60 MG capsule Take 1 capsule (60 mg total) by mouth daily. 09/09/20   Fayrene Helper, MD  empagliflozin (JARDIANCE) 25 MG TABS tablet Take 1 tablet (25 mg total) by mouth daily before breakfast. 09/09/20   Fayrene Helper, MD  furosemide (LASIX) 40 MG tablet TAKE ONE TABLET (40MG  TOTAL) BY MOUTH TWO TIMES DAILY. 07/22/20   [name], MD  glimepiride (AMARYL) 4 MG tablet Take 1 tablet (4 mg total) by mouth daily with breakfast. 09/09/20   Fayrene Helper, MD  levothyroxine (SYNTHROID) 75 MCG tablet Take 1 tablet (75 mcg total) by mouth daily before breakfast. 09/09/20   Fayrene Helper, MD  linaclotide (LINZESS) 290 MCG CAPS capsule Take 290 mcg by mouth daily before breakfast.    [provider]  Multiple Vitamin (MULTIVITAMIN WITH MINERALS) TABS tablet Take 1 tablet by mouth daily.    [provider]  nitroGLYCERIN (NITROSTAT) 0.4 MG SL tablet Place 1 tablet (0.4 mg total) under the tongue every 5 (five) minutes as needed for chest pain. 05/22/16   Barrett, Evelene Croon, PA-C  pantoprazole (PROTONIX) 40 MG tablet TAKE ONE (1) TABLET BY MOUTH EVERY DAY 04/10/20   Erenest Rasher, PA-C  potassium chloride (KLOR-CON) 10 MEQ tablet Take 1 tablet (10 mEq total) by mouth daily. Please schedule appointment with Dr. Gwenlyn Found 09/23/20 12/22/20  [name], MD  pramipexole (MIRAPEX) 0.25 MG tablet Take 2 tablets (0.5 mg total) by mouth 2 (two) times daily. Take 0.5mg  by mouth at 7 PM and 0.5mg   at 9 PM daily. 02/25/20   Ward Givens, NP  rosuvastatin (CRESTOR) 20 MG tablet Take 1 tablet (20 mg total) by mouth daily. 09/09/20   Fayrene Helper, MD  UNABLE TO [place] health supplies for CPAP 09/30/20   Perlie Mayo, NP    Allergies    Codeine and Tramadol  Review of Systems   Review of Systems  Constitutional: Negative for fever.  Eyes: Negative for visual disturbance.  Respiratory: Negative for shortness of breath.   Cardiovascular: Negative for chest pain.  Gastrointestinal: Positive for nausea. Negative for abdominal pain and vomiting.  Genitourinary: Negative for dysuria and hematuria.  Musculoskeletal:       Left hip pain Left shoulder pain  Neurological: Positive for light-headedness. Negative for headaches.  All other systems reviewed and are negative.   Physical Exam Updated Vital Signs BP (!) 145/88   Pulse 82   Temp 97.8 F (36.6 C) (Oral)   Resp 17   Ht 5\' 9"  (1.753 m)   Wt 96.6 kg   SpO2 98%   BMI 31.45 kg/m   Physical Exam Vitals and nursing note reviewed.  Constitutional:      Appearance: Normal appearance. He is well-developed.  HENT:     Head: Normocephalic and atraumatic.     Comments: No tenderness to palpation of skull. No deformities or crepitus noted. No open wounds, abrasions or lacerations.  Eyes:     General: Lids are normal.     Conjunctiva/sclera: Conjunctivae normal.     Pupils: Pupils are equal, round, and reactive to light.     Comments: PERRL. EOMs intact. No nystagmus. No neglect.   Neck:     Comments: Tenderness palpation of the midline T-spine.  No deformity or step-offs noted.  This tenderness extends into the muscles of the paraspinal muscles bilaterally. Cardiovascular:     Rate and Rhythm: Normal rate and regular rhythm.     Pulses: Normal pulses.           Radial pulses are 2+ on the right side and 2+ on the left side.       Dorsalis pedis pulses are 2+ on the right side and 2+ on the left side.     Heart sounds: Normal heart sounds. No murmur heard. No friction rub. No gallop.   Pulmonary:     Effort: Pulmonary effort is normal.     Breath sounds: Normal breath sounds.     Comments: Lungs clear to auscultation bilaterally.  Symmetric chest rise.  No wheezing, rales, rhonchi. Chest:     Comments: Mild tenderness noted to left posterior chest wall that extends to the lateral side.  No deformity or crepitus noted. Abdominal:     Palpations: Abdomen is soft. Abdomen is not rigid.     Tenderness: There is no abdominal tenderness. There is no guarding.     Comments: Abdomen is soft, non-distended, non-tender. No rigidity, No guarding. No peritoneal signs.  Musculoskeletal:        General: Normal range of motion.     Cervical back: Full passive range of motion without pain.     Comments: Tenderness palpation noted midline T and L-spine.  No deformity or step-offs noted.  Tenderness palpation diffusely to the anterior aspect of left shoulder.  No deformity or crepitus noted.  Good range of motion of left shoulder.  No bony tenderness of the left elbow, left forearm.  No bony tenderness noted to right upper extremity.  No pelvic instability.  Mild tenderness palpation noted to the anterior aspect of the left hip.  No deformity or crepitus noted.  Limited flexion/tension secondary to pain.  Limited external and internal rotation secondary to pain.  No shortening.  No rotation.  No bony tenderness in left knee, left tib-fib, left ankle.  No tenderness palpation on right lower extremity.  Skin:    General: Skin is warm and dry.     Capillary Refill: Capillary refill takes less than 2 seconds.  Neurological:     Mental Status: He is alert and oriented to person, place, and time.     Comments: Cranial nerves III-XII intact Follows commands, Moves all  extremities  5/5 strength to BUE and BLE  Sensation intact throughout all major nerve distributions No slurred speech. No facial droop.   Psychiatric:        Speech: Speech normal.     ED Results / Procedures / Treatments   Labs (all labs ordered are listed, but only abnormal results are displayed) Labs Reviewed - No data to display  EKG None  Radiology DG Chest 1 View  Result Date: 12/12/2020 CLINICAL DATA:  Fall last night.  Hypertension.  CHF.  Diabetes. EXAM: CHEST  1 VIEW COMPARISON:  05/16/2016 FINDINGS: Two frontal radiographs. Midline trachea. Mild cardiomegaly. Atherosclerosis in the transverse aorta. No pleural effusion or pneumothorax. No congestive failure. Mild left hemidiaphragm elevation. Left base scarring or subsegmental atelectasis. IMPRESSION: No acute findings. Cardiomegaly without congestive failure. Electronically Signed   By: Abigail Miyamoto M.D.   On: 12/12/2020 12:25   DG Thoracic Spine 2 View  Result Date: 12/12/2020 CLINICAL DATA:  [place] last night with thoracic region back pain EXAM: THORACIC SPINE 2 VIEWS COMPARISON:  MRI 03/13/2020 FINDINGS: No evidence of traumatic malalignment. No scoliosis. No evidence of fracture. Chronic bridging osteophytes in the thoracic region, particularly in the mid and lower thoracic spine. IMPRESSION: No acute or traumatic finding. Chronic bridging osteophytes. Electronically Signed   By: Nelson Chimes M.D.   On: 12/12/2020 12:26   DG Lumbar Spine Complete  Result Date: 12/12/2020 CLINICAL DATA:  [place] last night.  Lumbar region back pain. EXAM: LUMBAR SPINE - COMPLETE 4+ VIEW COMPARISON:  03/13/2020 FINDINGS: No traumatic malalignment. Likely chronic degenerative retrolisthesis at L2-3 of 6 mm. Distant PLIF L4-5 with solid union. Solid bridging osteophytes at the L3-4 level and possibly the L2-3 level. Chronic degenerative disc space narrowing L1-2 and L2-3. Lesser disc space narrowing L5-S1. No evidence of regional fracture.  IMPRESSION: 1. No acute or traumatic finding. Previous PLIF L4-5 with solid union. 2. Chronic degenerative changes above and below. Electronically Signed   By: Nelson Chimes M.D.   On: 12/12/2020 12:27   CT Head Wo Contrast  Result Date: 12/12/2020 CLINICAL DATA:  [place] to the floor. EXAM: CT HEAD WITHOUT CONTRAST TECHNIQUE: Contiguous axial images were obtained from the base of the skull through the vertex without intravenous contrast. COMPARISON:  12/08/2018 FINDINGS: Brain: No sign of acute infarction. Chronic small-vessel changes of the pons. Chronic small-vessel changes of the hemispheric white matter. Old infarction right lateral thalamus/posterior limb internal capsule. No mass, hemorrhage, hydrocephalus or extra-axial collection. Vascular: There is atherosclerotic calcification of the major vessels at the base of the brain. Skull: Negative Sinuses/Orbits: Clear/normal Other: None IMPRESSION: No acute or traumatic finding. Chronic small-vessel changes of the pons, right lateral thalamus/posterior limb internal capsule and hemispheric white matter. Electronically Signed   By: Nelson Chimes M.D.   On: 12/12/2020 12:29  CT Cervical Spine Wo Contrast  Result Date: 12/12/2020 CLINICAL DATA:  [place] to the ground with trauma to the head and neck. EXAM: CT CERVICAL SPINE WITHOUT CONTRAST TECHNIQUE: Multidetector CT imaging of the cervical spine was performed without intravenous contrast. Multiplanar CT image reconstructions were also generated. COMPARISON:  02/17/2012 FINDINGS: Alignment: No traumatic malalignment. One or 2 mm of degenerative anterolisthesis at C4-5 and C7-T1. Skull base and vertebrae: No fracture or primary bone lesion. Soft tissues and spinal canal: No traumatic finding. Disc levels: Foramen magnum widely patent. Ordinary osteoarthritis at the C1-2 articulation but without encroachment upon the neural spaces. C2-3: Chronic fusion of the posterior elements. Sufficient patency of the canal and  foramina. C3-4: Previous ACDF. Solid union with sufficient patency of the canal and foramina. C4-5: Facet osteoarthritis with 2 mm of anterolisthesis. Chronic calcified disc herniation or chronic ossification of the posterior longitudinal ligament centrally and to the left from mid C4 to mid C5. spinal stenosis at this level, particularly on the left. C5-6: Endplate osteophytes. Facet arthropathy. Spinal stenosis and bilateral foraminal stenosis. C6-7: Endplate osteophytes and facet arthropathy. Mild foraminal narrowing on the left. C7-T1: Facet osteoarthritis with 2 mm of anterolisthesis. No apparent compressive narrowing of the canal or foramina. Upper chest: Not included. Other: None IMPRESSION: 1. No acute or traumatic finding. Previous ACDF C3-4 with solid union and sufficient patency of the canal and foramina . 2. C4-5: Chronic calcified disc herniation or chronic ossification of the posterior longitudinal ligament centrally and to the left of midline from mid C4 to mid C5. Spinal stenosis at this level, particularly on the left. 3. Facet osteoarthritis at C4-5 and C7-T1 with 2 mm of anterolisthesis. 4. Foraminal stenosis at C4-5, C5-6 and C6-7 that could cause neural compression as outlined above. Electronically Signed   By: Nelson Chimes M.D.   On: 12/12/2020 12:33   DG Shoulder Left  Result Date: 12/12/2020 CLINICAL DATA:  [place] last night with left shoulder pain EXAM: LEFT SHOULDER - 2+ VIEW COMPARISON:  None. FINDINGS: No evidence of fracture or dislocation. Mild narrowing of the humeral acromial distance that could be seen with rotator cuff disease. Mild degenerative change of the [facility] joint. IMPRESSION: No acute finding. Mild narrowing of the humeral acromial distance that could be seen with rotator cuff disease. Electronically Signed   By: Nelson Chimes M.D.   On: 12/12/2020 12:25   DG Hip Unilat W or Wo Pelvis 2-3 Views Left  Result Date: 12/12/2020 CLINICAL DATA:  [place] last night with left hip  pain. EXAM: DG HIP (WITH OR WITHOUT PELVIS) 2-3V LEFT COMPARISON:  None. FINDINGS: No evidence of acute pelvic or hip fracture. Chronic well corticated calcification adjacent to the left greater trochanter. IMPRESSION: No acute finding. Chronic calcification adjacent to the left greater trochanter. Electronically Signed   By: Nelson Chimes M.D.   On: 12/12/2020 12:24    Procedures Procedures   Medications Ordered in ED Medications  HYDROcodone-acetaminophen (NORCO/VICODIN) 5-325 MG per tablet 1 tablet (1 tablet Oral Given 12/12/20 1227)  ondansetron (ZOFRAN-ODT) disintegrating tablet 4 mg (4 mg Oral Given 12/12/20 1227)    ED Course  I have reviewed the triage vital signs and the nursing notes.  Pertinent labs & imaging results that were available during my care of the patient were reviewed by me and considered in my medical decision making (see chart for details).    MDM Rules/Calculators/A&P  75 year old male who presents for evaluation of fall that occurred this morning about 3 AM.  Reports that he was walking into his kitchen to get something to eat and states his restless leg caused his leg to jerk out and him to fall.  No preceding chest pain or dizziness.  On initially arrival, he is afebrile, nontoxic-appearing.  Vital signs are stable.  On exam, he has tenderness noted diffusely to his midline T-spine that extends to the paraspinal muscles.  He also has some tenderness into the lateral chest.  Patient also exhibits tenderness noted to the left shoulder, left hip.  We will plan for imaging.  Given that he hit his head and had possible LOC,  Will obtain imaging of head.  Hip XR shows no acute no acute finding. Shoulder XR negative for any acute finding. T spine XR negative. L spine negative for any acute findings. He has a previous PLIF L4-L5 with solid union. CXR negative for any acute finding. CT head with no acute finding. CT cervical negative for any acute  finding.  Discussed results with patient.  He still having some pain.  Most notably in his back and his hip.  He has been ambulatory since this happened.  Do not feel that this warrants further imaging of his hip for occult fracture.  We will plan for short course of pain medication.  I instructed patient and his wife very carefully regarding using pain medication. At this time, patient exhibits no emergent life-threatening condition that require further evaluation in ED. Patient had ample opportunity for questions and discussion. All patient's questions were answered with full understanding. Strict return precautions discussed. Patient expresses understanding and agreement to plan.   Portions of this note were generated with [employer]. Dictation errors may occur despite best attempts at proofreading.   Final Clinical Impression(s) / ED Diagnoses Final diagnoses:  Fall, initial encounter  Hip pain  Acute bilateral low back pain, unspecified whether sciatica present    Rx / DC Orders ED Discharge Orders         Ordered    lidocaine (LIDODERM) 5 %  Every 24 hours        12/12/20 1356    HYDROcodone-acetaminophen (NORCO/VICODIN) 5-325 MG tablet  Every 6 hours PRN        12/12/20 1356           Desma Mcgregor 12/12/20 1445    Fredia Sorrow, MD 12/14/20 [phone]

## 2020-12-12 NOTE — ED Notes (Signed)
Pt undressed, c-collar applied.

## 2020-12-12 NOTE — Discharge Instructions (Addendum)
Your imaging today looked reassuring.   Take pain medications as directed for break through pain. Do not drive or operate machinery while taking this medication.  As we discussed this can make you drowsy. Please take it carefully so you do not fall.   Please follow up with your primary care doctor.   Return to the Emergency Dept for any worsening pain, vomiting, difficulty walking or any other worsening or concerning symptoms.

## 2020-12-16 ENCOUNTER — Ambulatory Visit (HOSPITAL_COMMUNITY): Payer: HMO

## 2020-12-17 ENCOUNTER — Telehealth: Payer: Self-pay

## 2020-12-17 NOTE — Telephone Encounter (Signed)
Patient called needs to speak to Hannah's nurse about his reckless legs.[name] He did not want to make no other appt as a phone visit.  I did reschedule his appt further out with Jarrett Soho.  He wants the nurse to return his call please. # [phone].

## 2020-12-17 NOTE — Telephone Encounter (Signed)
Appt made

## 2020-12-18 ENCOUNTER — Ambulatory Visit (HOSPITAL_COMMUNITY): Payer: HMO

## 2020-12-22 ENCOUNTER — Encounter: Payer: Self-pay | Admitting: Nurse Practitioner

## 2020-12-22 ENCOUNTER — Other Ambulatory Visit: Payer: Self-pay

## 2020-12-22 ENCOUNTER — Ambulatory Visit (INDEPENDENT_AMBULATORY_CARE_PROVIDER_SITE_OTHER): Payer: HMO | Admitting: Nurse Practitioner

## 2020-12-22 VITALS — BP 129/70 | HR 84 | Temp 97.9°F | Resp 20 | Ht 72.0 in | Wt 195.2 lb

## 2020-12-22 DIAGNOSIS — I10 Essential (primary) hypertension: Secondary | ICD-10-CM

## 2020-12-22 DIAGNOSIS — I739 Peripheral vascular disease, unspecified: Secondary | ICD-10-CM | POA: Diagnosis not present

## 2020-12-22 DIAGNOSIS — R252 Cramp and spasm: Secondary | ICD-10-CM | POA: Diagnosis not present

## 2020-12-22 DIAGNOSIS — G629 Polyneuropathy, unspecified: Secondary | ICD-10-CM

## 2020-12-22 NOTE — Progress Notes (Signed)
Acute Office Visit  Subjective:    Patient ID: [name], male    DOB: 19-Jan-1946, 75 y.o.   MRN: [id number]  Chief Complaint  Patient presents with  . Leg Pain    Hx of restless leg/body syndrome, has pain and trouble with this, gait abnormality and balance issues x 6 months.   . Diabetes    Neuropathy   . Erectile Dysfunction    Has been having testosterone therapy. Has had recent blood drawn from [facility]. Pt is currently on topical medication.     HPI Patient is in today for leg pain.  He states that he has pain in his knees as well as cramping in his ankles.  He states if feels like someone puts a steel box on his ankle.  He went to [facility] neuro for his cramping, restless legs, and neuropathy, but he was not satisfied because he got no testing, only medications.   He has been taking meds from [facility] for experimental treatment for erectile dysfunction. He is subject ID # [id number] with [facility], and they can be contacted at [phone].  Past Medical History:  Diagnosis Date  . Aftercare following surgery of the circulatory system, [place] 12/04/2013  . Angina decubitus ([place]) 05/19/2016  . Arrhythmia 05/20/2016  . Arthritis   . ARTHRITIS, RIGHT FOOT 06/26/2008   Qualifier: Diagnosis of  By: Aline Brochure MD, Dorothyann Peng    . Cardiomyopathy- EF NL 01/2013, now 30-35% echo 03/14/2012  . Carotid artery occlusion    left s/p CEA  . Cellulitis of left foot 05/07/2018  . CHF (congestive heart failure) ([place])   . Critical lower limb ischemia (HCC) 02/28/2014   Critical limb ischemia   . Diabetes mellitus   . Elevated troponin 04/11/2016  . GERD (gastroesophageal reflux disease)   . History of stroke June 2013 03/14/2012  . HOH (hard of hearing)   . Hypertension   . Hypothyroidism   . Nonhealing skin ulcer ([place]) 10/28/2014  . Obstructive sleep apnea   . Orthostatic hypotension   . Osteomyelitis ([place])    left great toe  . Pain in the chest 05/17/2016   . Peripheral arterial disease ([place]), s/p PTA x 2 RLE    nonhealing ulcers bilaterally on each great toe  . Pneumonia   . PONV (postoperative nausea and vomiting)   . Pulmonary nodule 05/17/2016  . Restless leg syndrome   . Shortness of breath   . Stroke [facility]) March 08, 2012  . Toe osteomyelitis, left ([place])   . Ulcer of great toe, left, with necrosis of bone ([place])   . Wears dentures     Past Surgical History:  Procedure Laterality Date  . AMPUTATION TOE Left 07/25/2018   Procedure: AMPUTATION TOE INTERPHALANGEAL HALLUX LEFT;  Surgeon: Evelina Bucy, DPM;  Location: [place];  Service: Podiatry;  Laterality: Left;  . ANGIOPLASTY  02/28/14   diamond back orbital rotational atherectomy of Rt. tibial  . BACK SURGERY    . BONE BIOPSY Left 07/25/2018   Procedure: SUPERFICIAL BONE BIOPSY;  Surgeon: Evelina Bucy, DPM;  Location: [place];  Service: Podiatry;  Laterality: Left;  . CARDIAC CATHETERIZATION N/A 05/19/2016   Procedure: Right/Left Heart Cath and Coronary Angiography;  Surgeon: Belva Crome, MD;  Location: [place] CV LAB;  Service: Cardiovascular;  Laterality: N/A;  . CERVICAL FUSION    . ENDARTERECTOMY Left 11/29/2013   Procedure: ENDARTERECTOMY CAROTID;  Surgeon: Serafina Mitchell, MD;  Location: [facility]  OR;  Service: Vascular;  Laterality: Left;  . ESOPHAGOGASTRODUODENOSCOPY  02/2010   Dr. Gala Romney: patient presented with food impaction, schatzki ring with superimposed component of stricture with erosive reflux esophagitis, s/p disimpaction but dilation planned at later date   . ESOPHAGOGASTRODUODENOSCOPY (EGD) WITH PROPOFOL N/A 08/13/2019   Dr. Gala Romney: Erosive reflux esophagitis with mild stricture and incidental Mallory-Weiss tear which precluded esophageal dilation.  Medium sized hiatal hernia.  [name] ESOPHAGOGASTRODUODENOSCOPY (EGD) WITH PROPOFOL N/A 10/11/2019   Dr. Gala Romney: Esophageal stenosis status post dilation, moderate hiatal hernia  . EYE SURGERY    . FLEXIBLE SIGMOIDOSCOPY N/A  08/13/2019   Procedure: FLEXIBLE SIGMOIDOSCOPY;  Surgeon: [name], MD;  [facility];  Service: Endoscopy;  Laterality: N/A;  colonoscopy aboerted due to formed stool and poor prep  . FOOT SURGERY    . KNEE ARTHROSCOPY WITH MEDIAL MENISECTOMY Left 05/06/2020   Procedure: KNEE ARTHROSCOPY WITH MEDIAL MENISCECTOMY AND LATERAL MENISCECTOMY;  Surgeon: [name], MD;  Location: [facility];  Service: Orthopedics;  Laterality: Left;  . Lower ext duplex doppler  03/14/14   Rt ABI 1.2  . LOWER EXTREMITY ANGIOGRAM Bilateral 02/18/2014   Procedure: LOWER EXTREMITY ANGIOGRAM;  Surgeon: [name], MD;  Location: [facility] CATH LAB;  Service: Cardiovascular;  Laterality: Bilateral;  . LOWER EXTREMITY ANGIOGRAM N/A 10/31/2014   Procedure: LOWER EXTREMITY ANGIOGRAM;  Surgeon: [name], MD;  Location: [facility] CATH LAB;  Service: Cardiovascular;  Laterality: N/A;  . Venia Minks DILATION N/A 10/11/2019   Procedure: Venia Minks DILATION;  Surgeon: [name], MD;  [facility];  Service: Endoscopy;  Laterality: N/A;  . MULTIPLE TOOTH EXTRACTIONS    . PV angiogram  02/18/2014   tibial vessel diseas bil.  [name] SPINE SURGERY    . tendon achillies lengthing and sesamoid      Family History  Problem Relation Age of Onset  . Heart disease Mother   . Hypertension Mother   . Heart attack Mother   . Hypertension Father   . Diabetes Father   . Diabetes Son   . Heart disease Son   . Hypertension Son   . Colon cancer Neg Hx     Social History   Socioeconomic History  . Marital status: Married    Spouse name: [name]   . Number of children: 4  . Years of education: college  . Highest education level: Not on file  Occupational History  . Occupation: Firefighter   Tobacco Use  . Smoking status: Former Smoker    Years: 1.00    Types: Pipe    Quit date: 07/08/1977    Years since quitting: 43.4  . Smokeless tobacco: Never Used  Vaping Use  . Vaping Use: Never used  Substance  and Sexual Activity  . Alcohol use: No    Alcohol/week: 0.0 standard drinks  . Drug use: No  . Sexual activity: Not on file  Other Topics Concern  . Not on file  Social History Narrative   Retired from medical -[employer]      Lives with [name]    Cat: Jazz      Enjoy: sleeping a lot       Diet: eats all food groups -chicken, steak-chopped, mostly veggie   Caffeine: coffee and soda "a lot"   Water: 3-4 cups daily      Wears seat belt   Does not use phone while driving    Smoke detectors at home    weapons  at home        Social Determinants of Health   Financial Resource Strain: Low Risk   . Difficulty of Paying Living Expenses: Not hard at all  Food Insecurity: No Food Insecurity  . Worried About Charity fundraiser in the Last Year: Never true  . Ran Out of Food in the Last Year: Never true  Transportation Needs: No Transportation Needs  . Lack of Transportation (Medical): No  . Lack of Transportation (Non-Medical): No  Physical Activity: Inactive  . Days of Exercise per Week: 0 days  . Minutes of Exercise per Session: 0 min  Stress: No Stress Concern Present  . Feeling of Stress : Only a little  Social Connections: Moderately Isolated  . Frequency of Communication with Friends and Family: Once a week  . Frequency of Social Gatherings with Friends and Family: Once a week  . Attends Religious Services: More than 4 times per year  . Active Member of Clubs or Organizations: No  . Attends Archivist Meetings: Never  . Marital Status: Married  Human resources officer Violence: Not At Risk  . Fear of Current or Ex-Partner: No  . Emotionally Abused: No  . Physically Abused: No  . Sexually Abused: No    Outpatient Medications Prior to Visit  Medication Sig Dispense Refill  . aspirin EC 81 MG EC tablet Take 1 tablet (81 mg total) by mouth daily. (Patient taking differently: Take 81 mg by mouth at bedtime.)    . b complex vitamins tablet Take 1 tablet by mouth  daily.    . carvedilol (COREG) 3.125 MG tablet Take 1 tablet (3.125 mg total) by mouth 2 (two) times daily with a meal. 180 tablet 3  . Cholecalciferol (VITAMIN D3) 5000 units TABS Take 5,000 Units by mouth 2 (two) times daily.     . clopidogrel (PLAVIX) 75 MG tablet TAKE ONE (1) TABLET BY MOUTH EVERY DAY 90 tablet 0  . Continuous Blood Gluc Receiver (FREESTYLE LIBRE 14 DAY READER) DEVI     . Continuous Blood Gluc Sensor (FREESTYLE LIBRE 14 DAY SENSOR) MISC     . Cyanocobalamin 1500 MCG TBDP Take 1,500 mcg by mouth daily.     . DULoxetine (CYMBALTA) 60 MG capsule Take 1 capsule (60 mg total) by mouth daily. 90 capsule 1  . empagliflozin (JARDIANCE) 25 MG TABS tablet Take 1 tablet (25 mg total) by mouth daily before breakfast. 90 tablet 1  . furosemide (LASIX) 40 MG tablet TAKE ONE TABLET (40MG  TOTAL) BY MOUTH TWO TIMES DAILY. 180 tablet 3  . glimepiride (AMARYL) 4 MG tablet Take 1 tablet (4 mg total) by mouth daily with breakfast. 90 tablet 1  . HYDROcodone-acetaminophen (NORCO/VICODIN) 5-325 MG tablet Take 1-2 tablets by mouth every 6 (six) hours as needed. 8 tablet 0  . levothyroxine (SYNTHROID) 75 MCG tablet Take 1 tablet (75 mcg total) by mouth daily before breakfast. 90 tablet 1  . lidocaine (LIDODERM) 5 % Place 1 patch onto the skin daily. Remove & Discard patch within 12 hours or as directed by MD 5 patch 0  . linaclotide (LINZESS) 290 MCG CAPS capsule Take 290 mcg by mouth daily before breakfast.    . Multiple Vitamin (MULTIVITAMIN WITH MINERALS) TABS tablet Take 1 tablet by mouth daily.    . nitroGLYCERIN (NITROSTAT) 0.4 MG SL tablet Place 1 tablet (0.4 mg total) under the tongue every 5 (five) minutes as needed for chest pain. 25 tablet 3  . pantoprazole (PROTONIX) 40  MG tablet TAKE ONE (1) TABLET BY MOUTH EVERY DAY 30 tablet 11  . potassium chloride (KLOR-CON) 10 MEQ tablet Take 1 tablet (10 mEq total) by mouth daily. Please schedule appointment with Dr. Gwenlyn Found 90 tablet 0  . pramipexole  (MIRAPEX) 0.25 MG tablet Take 2 tablets (0.5 mg total) by mouth 2 (two) times daily. Take 0.5mg  by mouth at 7 PM and 0.5mg  at 9 PM daily. 360 tablet 3  . rosuvastatin (CRESTOR) 20 MG tablet Take 1 tablet (20 mg total) by mouth daily. 90 tablet 1  . UNABLE TO FIND Home health supplies for CPAP 1 Product 0   No facility-administered medications prior to visit.    Allergies  Allergen Reactions  . Codeine Nausea And Vomiting  . Tramadol Nausea Only    Review of Systems     Objective:    Physical Exam  BP 129/70   Pulse 84   Temp 97.9 F (36.6 C)   Resp 20   Ht 6' (1.829 m)   Wt 195 lb 3.2 oz (88.5 kg)   SpO2 95%   BMI 26.47 kg/m  Wt Readings from Last 3 Encounters:  12/22/20 195 lb 3.2 oz (88.5 kg)  12/12/20 213 lb (96.6 kg)  09/30/20 213 lb (96.6 kg)    There are no preventive care reminders to display for this patient.  There are no preventive care reminders to display for this patient.   Lab Results  Component Value Date   TSH 4.410 09/25/2020   Lab Results  Component Value Date   WBC 5.1 09/25/2020   HGB 11.3 (L) 09/25/2020   HCT 33.2 (L) 09/25/2020   MCV 100 (H) 09/25/2020   PLT 207 09/25/2020   Lab Results  Component Value Date   NA 139 09/25/2020   K 3.3 (L) 09/25/2020   CO2 25 09/25/2020   GLUCOSE 147 (H) 09/25/2020   BUN 14 09/25/2020   CREATININE 1.00 09/25/2020   BILITOT 0.5 09/25/2020   ALKPHOS 87 09/25/2020   AST 13 09/25/2020   ALT 12 09/25/2020   PROT 7.0 09/25/2020   ALBUMIN 4.3 09/25/2020   CALCIUM 8.9 09/25/2020   ANIONGAP 13 05/01/2020   Lab Results  Component Value Date   CHOL 141 09/25/2020   Lab Results  Component Value Date   HDL 50 09/25/2020   Lab Results  Component Value Date   LDLCALC 75 09/25/2020   Lab Results  Component Value Date   TRIG 86 09/25/2020   Lab Results  Component Value Date   CHOLHDL 2.8 09/25/2020   Lab Results  Component Value Date   HGBA1C 7.8 (H) 09/25/2020       Assessment &  Plan:   Problem List Items Addressed This Visit      Cardiovascular and Mediastinum   Hypertension (Chronic)    -well controlled today BP Readings from Last 3 Encounters:  12/22/20 129/70  12/12/20 (!) 145/88  08/11/20 130/69         Peripheral arterial disease (HCC) (Chronic)    -has cramping to bilateral legs and varicose veins -referred to vascular to see if there could be a vascular component to his leg issues      Relevant Orders   Ambulatory referral to Neurology   Ambulatory referral to Vascular Surgery     Nervous and Auditory   Peripheral neuropathy (Chronic)    -has jerking to legs, and has had several falls -he states there was no w/u performed at [facility] -sees Dr. Aline Brochure  as well as PT -referral for 2nd opinion for Dr. Merlene Laughter initiated today -will check electrolytes today to assess for reversible causes      Relevant Orders   Ambulatory referral to Neurology   Ambulatory referral to Vascular Surgery    Other Visit Diagnoses    Cramp of both lower extremities    -  Primary   Relevant Orders   Basic metabolic panel   Magnesium   Ambulatory referral to Neurology   Ambulatory referral to Vascular Surgery       No orders of the defined types were placed in this encounter.    Noreene Larsson, NP

## 2020-12-22 NOTE — Assessment & Plan Note (Addendum)
-  has jerking to legs, and has had several falls -he states there was no w/u performed at [facility] -sees Dr. Aline Brochure as well as PT -referral for 2nd opinion for Dr. Merlene Laughter initiated today -will check electrolytes today to assess for reversible causes

## 2020-12-22 NOTE — Assessment & Plan Note (Signed)
-  well controlled today BP Readings from Last 3 Encounters:  12/22/20 129/70  12/12/20 (!) 145/88  08/11/20 130/69

## 2020-12-22 NOTE — Assessment & Plan Note (Signed)
-  has cramping to bilateral legs and varicose veins -referred to vascular to see if there could be a vascular component to his leg issues

## 2020-12-23 ENCOUNTER — Ambulatory Visit (HOSPITAL_COMMUNITY): Payer: HMO | Attending: Family Medicine | Admitting: Physical Therapy

## 2020-12-23 DIAGNOSIS — M25562 Pain in left knee: Secondary | ICD-10-CM | POA: Diagnosis not present

## 2020-12-23 DIAGNOSIS — E08621 Diabetes mellitus due to underlying condition with foot ulcer: Secondary | ICD-10-CM | POA: Diagnosis not present

## 2020-12-23 DIAGNOSIS — L97411 Non-pressure chronic ulcer of right heel and midfoot limited to breakdown of skin: Secondary | ICD-10-CM | POA: Insufficient documentation

## 2020-12-23 DIAGNOSIS — R262 Difficulty in walking, not elsewhere classified: Secondary | ICD-10-CM

## 2020-12-23 DIAGNOSIS — G8929 Other chronic pain: Secondary | ICD-10-CM | POA: Diagnosis not present

## 2020-12-23 LAB — BASIC METABOLIC PANEL
BUN/Creatinine Ratio: 16 (ref 10–24)
BUN: 13 mg/dL (ref 8–27)
CO2: 24 mmol/L (ref 20–29)
Calcium: 9 mg/dL (ref 8.6–10.2)
Chloride: 98 mmol/L (ref 96–106)
Creatinine, Ser: 0.82 mg/dL (ref 0.76–1.27)
Glucose: 231 mg/dL — ABNORMAL HIGH (ref 65–99)
Potassium: 4 mmol/L (ref 3.5–5.2)
Sodium: 137 mmol/L (ref 134–144)
eGFR: 90 mL/min/{1.73_m2} (ref >59)

## 2020-12-23 LAB — MAGNESIUM: Magnesium: 1.9 mg/dL (ref 1.6–2.3)

## 2020-12-23 NOTE — Progress Notes (Signed)
His electrolytes are fine, so there isn't a supplement to call in to improve the cramping.  Blood sugar was elevated, but he wasn't fasting.

## 2020-12-23 NOTE — Therapy (Signed)
[place], [place], [address] Phone: [phone]   Fax:  [phone]  Wound Care Therapy  Patient Details  Name: [name] MRN: [id number] Date of Birth: Aug 26, 1946 Referring Provider (PT): Perlie Mayo   Encounter Date: 12/23/2020   PT End of Session - 12/23/20 1617    Visit Number 16    Number of Visits 26    Date for PT Re-Evaluation 01/15/21    Authorization Type [employer], no VL, no auth , $15 co pay    Progress Note Due on Visit 25    PT Start Time 1048    PT Stop Time 1130    PT Time Calculation (min) 42 min    Equipment Utilized During Treatment Gait belt    Activity Tolerance Patient tolerated treatment well;Patient limited by fatigue    Behavior During Therapy [facility] for tasks assessed/performed           Past Medical History:  Diagnosis Date  . Aftercare following surgery of the circulatory system, [place] 12/04/2013  . Angina decubitus ([place]) 05/19/2016  . Arrhythmia 05/20/2016  . Arthritis   . ARTHRITIS, RIGHT FOOT 06/26/2008   Qualifier: Diagnosis of  By: Aline Brochure MD, Dorothyann Peng    . Cardiomyopathy- EF NL 01/2013, now 30-35% echo 03/14/2012  . Carotid artery occlusion    left s/p CEA  . Cellulitis of left foot 05/07/2018  . CHF (congestive heart failure) ([place])   . Critical lower limb ischemia (HCC) 02/28/2014   Critical limb ischemia   . Diabetes mellitus   . Elevated troponin 04/11/2016  . GERD (gastroesophageal reflux disease)   . History of stroke June 2013 03/14/2012  . HOH (hard of hearing)   . Hypertension   . Hypothyroidism   . Nonhealing skin ulcer ([place]) 10/28/2014  . Obstructive sleep apnea   . Orthostatic hypotension   . Osteomyelitis ([place])    left great toe  . Pain in the chest 05/17/2016  . Peripheral arterial disease ([place]), s/p PTA x 2 RLE    nonhealing ulcers bilaterally on each great toe  . Pneumonia   . PONV (postoperative nausea and vomiting)   . Pulmonary nodule  05/17/2016  . Restless leg syndrome   . Shortness of breath   . Stroke [facility]) March 08, 2012  . Toe osteomyelitis, left ([place])   . Ulcer of great toe, left, with necrosis of bone ([place])   . Wears dentures     Past Surgical History:  Procedure Laterality Date  . AMPUTATION TOE Left 07/25/2018   Procedure: AMPUTATION TOE INTERPHALANGEAL HALLUX LEFT;  Surgeon: Evelina Bucy, DPM;  Location: [place];  Service: Podiatry;  Laterality: Left;  . ANGIOPLASTY  02/28/14   diamond back orbital rotational atherectomy of Rt. tibial  . BACK SURGERY    . BONE BIOPSY Left 07/25/2018   Procedure: SUPERFICIAL BONE BIOPSY;  Surgeon: Evelina Bucy, DPM;  Location: [place];  Service: Podiatry;  Laterality: Left;  . CARDIAC CATHETERIZATION N/A 05/19/2016   Procedure: Right/Left Heart Cath and Coronary Angiography;  Surgeon: Belva Crome, MD;  Location: [place] CV LAB;  Service: Cardiovascular;  Laterality: N/A;  . CERVICAL FUSION    . ENDARTERECTOMY Left 11/29/2013   Procedure: ENDARTERECTOMY CAROTID;  Surgeon: Serafina Mitchell, MD;  Location: [place];  Service: Vascular;  Laterality: Left;  . ESOPHAGOGASTRODUODENOSCOPY  02/2010   Dr. Gala Romney: patient presented with food impaction, schatzki ring with superimposed component of stricture  with erosive reflux esophagitis, s/p disimpaction but dilation planned at later date   . ESOPHAGOGASTRODUODENOSCOPY (EGD) WITH PROPOFOL N/A 08/13/2019   Dr. Gala Romney: Erosive reflux esophagitis with mild stricture and incidental Mallory-Weiss tear which precluded esophageal dilation.  Medium sized hiatal hernia.  [name] ESOPHAGOGASTRODUODENOSCOPY (EGD) WITH PROPOFOL N/A 10/11/2019   Dr. Gala Romney: Esophageal stenosis status post dilation, moderate hiatal hernia  . EYE SURGERY    . FLEXIBLE SIGMOIDOSCOPY N/A 08/13/2019   Procedure: FLEXIBLE SIGMOIDOSCOPY;  Surgeon: [name], MD;  [facility];  Service: Endoscopy;  Laterality: N/A;  colonoscopy aboerted due to formed stool and  poor prep  . FOOT SURGERY    . KNEE ARTHROSCOPY WITH MEDIAL MENISECTOMY Left 05/06/2020   Procedure: KNEE ARTHROSCOPY WITH MEDIAL MENISCECTOMY AND LATERAL MENISCECTOMY;  Surgeon: [name], MD;  Location[facility];  Service: Orthopedics;  Laterality: Left;  . Lower ext duplex doppler  03/14/14   Rt ABI 1.2  . LOWER EXTREMITY ANGIOGRAM Bilateral 02/18/2014   Procedure: LOWER EXTREMITY ANGIOGRAM;  Surgeon: [name], MD;  Location[facility] CATH LAB;  Service: Cardiovascular;  Laterality: Bilateral;  . LOWER EXTREMITY ANGIOGRAM N/A 10/31/2014   Procedure: LOWER EXTREMITY ANGIOGRAM;  Surgeon: [name], MD;  Location: [facility] CATH LAB;  Service: Cardiovascular;  Laterality: N/A;  . Venia Minks DILATION N/A 10/11/2019   Procedure: Venia Minks DILATION;  Surgeon: [name], MD;  [facility];  Service: Endoscopy;  Laterality: N/A;  . MULTIPLE TOOTH EXTRACTIONS    . PV angiogram  02/18/2014   tibial vessel diseas bil.  [name] SPINE SURGERY    . tendon achillies lengthing and sesamoid      There were no vitals filed for this visit.               Wound Therapy - 12/23/20 1609    Subjective Pt last seen on 3/24 and has had the same bandage in place since then.  States it does not hurt.  States he has podiatrist appt tomorrow and is getting new shoes.    Pain Scale 0-10    Pain Score 0-No pain    Wound Properties Date First Assessed: 10/20/20 Time First Assessed: 1000 Wound Type: Diabetic ulcer Location: Foot Location Orientation: Anterior;Right Wound Description (Comments): bottom of foot Present on Admission: Yes   Wound Image View All Images View Images    Dressing Type Gauze (Comment)    Dressing Changed Changed    Dressing Status Old drainage    Dressing Change Frequency PRN    Site / Wound Assessment Clean    % Wound base Red or Granulating 100%    Peri-wound Assessment Maceration    Wound Length (cm) 0.8 cm   was 1cm   Wound Width (cm) 0.8 cm   was 1 cm   Wound  Depth (cm) 0.2 cm   was 0.2 cm   Wound Volume (cm^3) 0.13 cm^3    Wound Surface Area (cm^2) 0.64 cm^2    Undermining (cm) at bottom, 6 oclock permieter    Drainage Amount Minimal    Drainage Description Serosanguineous    Treatment Cleansed;Debridement (Selective)    Selective Debridement - Location edges and calloused perimeter; expect wound to get larger with debridement    Selective Debridement - Tools Used Forceps;Scissors    Selective Debridement - Tissue Removed callous devitalized tissue.    Wound Therapy - Clinical Statement Pt states he has been seeing specialists and thinks low testosterone is causing his low  energy and falling.  States he hopes to get better with doing his exercises.  pt only scheduled for woundcare today.  Dressing has been in place X 2 weeks as he has not been to clinic since 3/24.  Cleansed and debrided away perimeter to promotes approximation.  measured with 0.2cm reduction in both length and width.  Used xerform in woundbed today and gauze over.  Pt given orthotic order and places where he can obtain this.  States he will discuss with his podiatrist and see what he needs to do.  Informed secretary that patient needs 90 minute sessions to work on both wound and exercises.    Wound Therapy - Functional Problem List difficulty walking, washing, dressing    Factors Delaying/Impairing Wound Healing Diabetes Mellitus;Immobility    Wound Plan continue with wound care,  Begin more standing balance activities as pt is completing supine exercises at home                     PT Short Term Goals - 11/19/20 1108      PT SHORT TERM GOAL #1   Title Patient will be independent in self management strategies to improve quality of life and functional outcomes.    Baseline 11/19/20: Reports compliance with HEP daily.    Time 4    Period Weeks    Status On-going      PT SHORT TERM GOAL #2   Title Patient will be able to report at least 25% improvement in overall  mobility.    Baseline 11/19/20:  Reports at least 25% improvement    Status Achieved      PT SHORT TERM GOAL #3   Title Patient's wound will be reduced in volume by at least 25% to demonstrate improved wound healing    Baseline was 2.3cm^3, now .11 cm^3    Status Achieved             PT Long Term Goals - 11/19/20 1112      PT LONG TERM GOAL #1   Title Patient will be able to ambulate at least 100 feet with RW in 2 minutes to demonstrate improved ambulatory endurance    Baseline 11/19/20:  2MWT 117ft with rollator, improved foot clearance followng cueing from therapist    Status Achieved      PT LONG TERM GOAL #2   Title Patient's wound will reduce in overall volume byt at least 50% to demonstrate improved wound healing    Baseline .62 cm^3, was 2.3 cm ^3    Status On-going      PT LONG TERM GOAL #3   Title Patient will be able to demonstate 5-110 degrees in left knee to demonstrate improved knee mobility    Baseline 11/19/20: Lt 18-123 degrees, Rt 5-120 degrees    Status On-going                  Patient will benefit from skilled therapeutic intervention in order to improve the following deficits and impairments:     Visit Diagnosis: Difficulty in walking, not elsewhere classified  Diabetic ulcer of right midfoot associated with diabetes mellitus due to underlying condition, limited to breakdown of skin (HCC)  Chronic pain of left knee     Problem List Patient Active Problem List   Diagnosis Date Noted  . Gait abnormality 09/30/2020  . Mild non proliferative diabetic retinopathy ([place]) 07/16/2020  . Need for immunization against influenza 06/03/2020  . S/P left knee arthroscopy 05/06/20  05/13/2020  . Abnormal MRI 03/26/2020  . Joint disorder of knee 03/26/2020  . Fall at home, initial encounter 03/13/2020  . Anemia 09/03/2019  . Reflux esophagitis 09/03/2019  . Esophageal dysphagia 05/30/2019  . Constipation 05/30/2019  . Positive colorectal cancer  screening using Cologuard test 05/30/2019  . Diabetic foot ulcer ([place]) 09/06/2018  . Congestive heart failure ([place]) 05/17/2016  . Bilateral carotid artery disease ([place]) 07/08/2014  . Obstructive sleep apnea 02/12/2014  . Restless leg syndrome 10/23/2013  . Overweight with body mass index (BMI) of 29 to 29.9 in adult 10/23/2013  . Hyperlipidemia 07/16/2013  . Peripheral arterial disease ([place]) 07/16/2013  . Hypertension 03/14/2012  . Type 2 diabetes mellitus with circulatory disorder ([employer]) 03/14/2012  . Peripheral neuropathy 03/14/2012  . Cardiomyopathy- EF NL 01/2013, now 30-35% echo 03/14/2012   Teena Irani, PTA/CLT [phone]  Teena Irani 12/23/2020, 4:18 PM  [place] [address] [place], [place], [address] Phone: [phone]   Fax:  [phone]  Name: CHRISTAIN NIZNIK MRN: [id number] Date of Birth: [birth year]

## 2020-12-24 ENCOUNTER — Ambulatory Visit (INDEPENDENT_AMBULATORY_CARE_PROVIDER_SITE_OTHER): Payer: HMO | Admitting: Podiatry

## 2020-12-24 ENCOUNTER — Other Ambulatory Visit: Payer: Self-pay

## 2020-12-24 DIAGNOSIS — E1142 Type 2 diabetes mellitus with diabetic polyneuropathy: Secondary | ICD-10-CM

## 2020-12-24 DIAGNOSIS — M21961 Unspecified acquired deformity of right lower leg: Secondary | ICD-10-CM

## 2020-12-24 DIAGNOSIS — L89899 Pressure ulcer of other site, unspecified stage: Secondary | ICD-10-CM | POA: Diagnosis not present

## 2020-12-24 DIAGNOSIS — E11621 Type 2 diabetes mellitus with foot ulcer: Secondary | ICD-10-CM | POA: Diagnosis not present

## 2020-12-24 DIAGNOSIS — Z89412 Acquired absence of left great toe: Secondary | ICD-10-CM | POA: Diagnosis not present

## 2020-12-24 NOTE — Progress Notes (Signed)
The patient presented to the office to day to pick up diabetic shoes and 3 pair diabetic custom inserts.  1 pair of inserts were put in the shoes and the shoes were fitted to the patient. The patient states they are comfortable and free of defect. He was satisfied with the fit of the shoe. Instructions for break in and wear were dispensed. The patient signed the delivery documentation and break in instruction form.  If any concerns or questions arise, he is instructed to call 

## 2020-12-25 ENCOUNTER — Encounter (HOSPITAL_COMMUNITY): Payer: Self-pay

## 2020-12-25 ENCOUNTER — Ambulatory Visit (HOSPITAL_COMMUNITY): Payer: HMO

## 2020-12-25 DIAGNOSIS — R262 Difficulty in walking, not elsewhere classified: Secondary | ICD-10-CM

## 2020-12-25 DIAGNOSIS — L97411 Non-pressure chronic ulcer of right heel and midfoot limited to breakdown of skin: Secondary | ICD-10-CM

## 2020-12-25 DIAGNOSIS — E08621 Diabetes mellitus due to underlying condition with foot ulcer: Secondary | ICD-10-CM

## 2020-12-25 DIAGNOSIS — G8929 Other chronic pain: Secondary | ICD-10-CM

## 2020-12-25 DIAGNOSIS — M25562 Pain in left knee: Secondary | ICD-10-CM

## 2020-12-25 NOTE — Therapy (Signed)
[place], [place], [address] Phone: [phone]   Fax:  [phone]  Physical Therapy Treatment  Patient Details  Name: [name] MRN: [id number] Date of Birth: 20-Dec-1945 Referring Provider (PT): Perlie Mayo   Encounter Date: 12/25/2020   PT End of Session - 12/25/20 1129    Visit Number 17    Number of Visits 26    Authorization Type [employer], no VL, no auth , $15 co pay    Progress Note Due on Visit 25    PT Start Time 1035    PT Stop Time 1128    PT Time Calculation (min) 53 min    Equipment Utilized During Treatment Gait belt    Activity Tolerance Patient tolerated treatment well;Patient limited by fatigue    Behavior During Therapy [facility] for tasks assessed/performed           Past Medical History:  Diagnosis Date  . Aftercare following surgery of the circulatory system, [place] 12/04/2013  . Angina decubitus ([place]) 05/19/2016  . Arrhythmia 05/20/2016  . Arthritis   . ARTHRITIS, RIGHT FOOT 06/26/2008   Qualifier: Diagnosis of  By: Aline Brochure MD, Dorothyann Peng    . Cardiomyopathy- EF NL 01/2013, now 30-35% echo 03/14/2012  . Carotid artery occlusion    left s/p CEA  . Cellulitis of left foot 05/07/2018  . CHF (congestive heart failure) ([place])   . Critical lower limb ischemia (HCC) 02/28/2014   Critical limb ischemia   . Diabetes mellitus   . Elevated troponin 04/11/2016  . GERD (gastroesophageal reflux disease)   . History of stroke June 2013 03/14/2012  . HOH (hard of hearing)   . Hypertension   . Hypothyroidism   . Nonhealing skin ulcer ([place]) 10/28/2014  . Obstructive sleep apnea   . Orthostatic hypotension   . Osteomyelitis ([place])    left great toe  . Pain in the chest 05/17/2016  . Peripheral arterial disease ([place]), s/p PTA x 2 RLE    nonhealing ulcers bilaterally on each great toe  . Pneumonia   . PONV (postoperative nausea and vomiting)   . Pulmonary nodule 05/17/2016  . Restless leg  syndrome   . Shortness of breath   . Stroke [facility]) March 08, 2012  . Toe osteomyelitis, left ([place])   . Ulcer of great toe, left, with necrosis of bone ([place])   . Wears dentures     Past Surgical History:  Procedure Laterality Date  . AMPUTATION TOE Left 07/25/2018   Procedure: AMPUTATION TOE INTERPHALANGEAL HALLUX LEFT;  Surgeon: Evelina Bucy, DPM;  Location: [place];  Service: Podiatry;  Laterality: Left;  . ANGIOPLASTY  02/28/14   diamond back orbital rotational atherectomy of Rt. tibial  . BACK SURGERY    . BONE BIOPSY Left 07/25/2018   Procedure: SUPERFICIAL BONE BIOPSY;  Surgeon: Evelina Bucy, DPM;  Location: [place];  Service: Podiatry;  Laterality: Left;  . CARDIAC CATHETERIZATION N/A 05/19/2016   Procedure: Right/Left Heart Cath and Coronary Angiography;  Surgeon: Belva Crome, MD;  Location: [place] [facility];  Service: Cardiovascular;  Laterality: N/A;  . CERVICAL FUSION    . ENDARTERECTOMY Left 11/29/2013   Procedure: ENDARTERECTOMY CAROTID;  Surgeon: Serafina Mitchell, MD;  Location: [place];  Service: Vascular;  Laterality: Left;  . ESOPHAGOGASTRODUODENOSCOPY  02/2010   Dr. Gala Romney: patient presented with food impaction, schatzki ring with superimposed component of stricture with erosive reflux esophagitis, s/p disimpaction but dilation  planned at later date   . ESOPHAGOGASTRODUODENOSCOPY (EGD) WITH PROPOFOL N/A 08/13/2019   Dr. Gala Romney: Erosive reflux esophagitis with mild stricture and incidental Mallory-Weiss tear which precluded esophageal dilation.  Medium sized hiatal hernia.  [name] ESOPHAGOGASTRODUODENOSCOPY (EGD) WITH PROPOFOL N/A 10/11/2019   Dr. Gala Romney: Esophageal stenosis status post dilation, moderate hiatal hernia  . EYE SURGERY    . FLEXIBLE SIGMOIDOSCOPY N/A 08/13/2019   Procedure: FLEXIBLE SIGMOIDOSCOPY;  Surgeon: [name], MD;  [facility];  Service: Endoscopy;  Laterality: N/A;  colonoscopy aboerted due to formed stool and poor prep  . FOOT SURGERY     . KNEE ARTHROSCOPY WITH MEDIAL MENISECTOMY Left 05/06/2020   Procedure: KNEE ARTHROSCOPY WITH MEDIAL MENISCECTOMY AND LATERAL MENISCECTOMY;  Surgeon: [name], MD;  Location: [facility];  Service: Orthopedics;  Laterality: Left;  . Lower ext duplex doppler  03/14/14   Rt ABI 1.2  . LOWER EXTREMITY ANGIOGRAM Bilateral 02/18/2014   Procedure: LOWER EXTREMITY ANGIOGRAM;  Surgeon: [name], MD;  Location: [facility] CATH [facility];  Service: Cardiovascular;  Laterality: Bilateral;  . LOWER EXTREMITY ANGIOGRAM N/A 10/31/2014   Procedure: LOWER EXTREMITY ANGIOGRAM;  Surgeon: [name], MD;  Location: [facility] C[facility];  Service: Cardiovascular;  Laterality: N/A;  . Venia Minks DILATION N/A 10/11/2019   Procedure: Venia Minks DILATION;  Surgeon: [name], MD;  [facility];  Service: Endoscopy;  Laterality: N/A;  . MULTIPLE TOOTH EXTRACTIONS    . PV angiogram  02/18/2014   tibial vessel diseas bil.  [name] SPINE SURGERY    . tendon achillies lengthing and sesamoid      There were no vitals filed for this visit.   Subjective Assessment - 12/25/20 1318    Subjective Pt stated he has some back in small of back.  No reoprts of recent falls.  Reports further testing to be complete concerning activity levels and ability to fall asleep so quickly.  Believes it may be related to low testosterone.    Pertinent History CHF , R foot wound, left knee scope, DB, Restless leg syndrome, hx of stroke.    Patient Stated Goals be able to walk normally and function correctly.    Currently in Pain? Yes    Pain Score 5     Pain Location Back    Pain Orientation Lower    Pain Descriptors / Indicators Aching    Pain Type Acute pain    Pain Onset 1 to 4 weeks ago    Pain Frequency Constant                           Wound Therapy - 12/25/20 1318    Subjective Pt stated he has some back in small of back.  No reoprts of recent falls.  Reports further testing to be complete concerning activity  levels and ability to fall asleep so quickly.  Believes it may be related to low testosterone.    Patient and Family Stated Goals to have wound to heal and to be able to walk    Date of Onset --   4-5 years ago   Prior Treatments wound clinics, neosporin on bandaid but bandaid wound stick.    Pain Scale 0-10    Evaluation and Treatment Procedures Explained to Patient/Family Yes    Evaluation and Treatment Procedures agreed to    Wound Properties Date First Assessed: 10/20/20 Time First Assessed: 1000 Wound Type: Diabetic ulcer Location: Foot  Location Orientation: Anterior;Right Wound Description (Comments): bottom of foot Present on Admission: Yes   Wound Image View All Images View Images    Dressing Type Impregnated gauze (bismuth);Gauze (Comment)   xeroform, vaseline perimeter, 2x2, medipore tape   Dressing Changed Changed    Dressing Status Old drainage    Dressing Change Frequency PRN    Site / Wound Assessment Clean    % Wound base Red or Granulating 100%    Peri-wound Assessment --   callous perimeter   Drainage Amount Minimal    Drainage Description Serosanguineous    Treatment Cleansed;Debridement (Selective)    Selective Debridement - Location edges and calloused perimeter; expect wound to get larger with debridement    Selective Debridement - Tools Used Forceps;Scissors    Selective Debridement - Tissue Removed callous devitalized tissue.    Wound Therapy - Clinical Statement Pt arrived with new orthotic shoe for pressure relief.  Cleansed foot and selective debridement to address callous perimeter of wound to increased approximation of wound.  Continued wiht xeroform on wound bed, vaseline perimeter of wound for skin integrity, gauze and medipore tape.  No reports of discomfort through session.  Pt fell asleep during treatment.    Wound Therapy - Functional Problem List difficulty walking, washing, dressing    Factors Delaying/Impairing Wound Healing Diabetes Mellitus;Immobility     Wound Therapy - Frequency 2X / week    Wound Therapy - Current Recommendations PT    Wound Plan continue with wound care,  Begin more standing balance activities as pt is completing supine exercises at home    Dressing  xeroform, vaseline perimeter, 2x2 and medipore tape.            [place] Adult PT Treatment/Exercise - 12/25/20 0001      Ambulation/Gait   Ambulation/Gait Yes    Ambulation/Gait Assistance 4: Min assist    Ambulation Distance (Feet) 60 Feet    Gait Comments cueing for foot clearance, posture, heel to toe mechanics and to increase cadence      Knee/Hip Exercises: Standing   Forward Lunges Both;10 reps    Forward Lunges Limitations onto step height 7In    Hip Abduction AROM;Stengthening;Knee bent;2 sets;10 reps    Functional Squat 2 sets;10 reps    Functional Squat Limitations rollator behind, UE support, cueing for mehcanics    Other Standing Knee Exercises sidestep front of mat 5RT      Knee/Hip Exercises: Seated   Sit to Sand 10 reps;without UE support                    PT Short Term Goals - 11/19/20 1108      PT SHORT TERM GOAL #1   Title Patient will be independent in self management strategies to improve quality of life and functional outcomes.    Baseline 11/19/20: Reports compliance with HEP daily.    Time 4    Period Weeks    Status On-going      PT SHORT TERM GOAL #2   Title Patient will be able to report at least 25% improvement in overall mobility.    Baseline 11/19/20:  Reports at least 25% improvement    Status Achieved      PT SHORT TERM GOAL #3   Title Patient's wound will be reduced in volume by at least 25% to demonstrate improved wound healing    Baseline was 2.3cm^3, now .25 cm^3    Status Achieved  PT Long Term Goals - 11/19/20 1112      PT LONG TERM GOAL #1   Title Patient will be able to ambulate at least 100 feet with RW in 2 minutes to demonstrate improved ambulatory endurance    Baseline 11/19/20:  2MWT  133ft with rollator, improved foot clearance followng cueing from therapist    Status Achieved      PT LONG TERM GOAL #2   Title Patient's wound will reduce in overall volume byt at least 50% to demonstrate improved wound healing    Baseline .62 cm^3, was 2.3 cm ^3    Status On-going      PT LONG TERM GOAL #3   Title Patient will be able to demonstate 5-110 degrees in left knee to demonstrate improved knee mobility    Baseline 11/19/20: Lt 18-123 degrees, Rt 5-120 degrees    Status On-going                 Plan - 12/25/20 1327    Clinical Impression Statement PT sessoin focus with gluteal strengthening and balance training.  Added forward lunges onto step with cueing for mechanics.  Required mod A for safety during sidestepping.  Pt continues to ambulate slow cadence and cueing for foot clearance for safety.  No reports of increased pain through session, was limited by fatigue with activities.    Personal Factors and Comorbidities Comorbidity 1;Comorbidity 3+;Comorbidity 2    Comorbidities CHF, left knee surgery, right chronic foot wound, DB, restless leg syndromes    Examination-Activity Limitations Bathing;Lift;Locomotion Level;Transfers;Stand;Stairs;Squat    Examination-Participation Restrictions Community Activity;Meal Prep    Stability/Clinical Decision Making Evolving/Moderate complexity    Clinical Decision Making Moderate    Rehab Potential Fair    PT Frequency 2x / week    PT Duration --   additional 4 weeks to end 01/08/21   PT Treatment/Interventions ADLs/Self Care Home Management;Other (comment);Moist Heat;Balance training;Therapeutic exercise;Therapeutic activities;Functional mobility training;Stair training;Gait training;DME Instruction;Ultrasound;Neuromuscular re-education;Patient/family education;Orthotic Fit/Training;Manual techniques;Taping;Passive range of motion;Electrical Stimulation    PT Next Visit Plan focus primarily on  standing exercises and balance training  continue wound care.  F/U on increased frequency of HEP compliance; work on improving glute strength.    PT Home Exercise Plan calf stretch with towel, bridges.  2/8: bridge, SLR, hip abduction, hip extension   2/10:  sit to stands; 2/17:  LAQ with wt, supine hip abduction, sidelying hip abduction, 2/21 standing hip abd; 3/2:SAQ; 3/8: long sitting hamstring strech, hip abduction    Consulted and Agree with Plan of Care Patient           Patient will benefit from skilled therapeutic intervention in order to improve the following deficits and impairments:  Abnormal gait,Decreased endurance,Decreased skin integrity,Increased edema,Decreased activity tolerance,Decreased balance,Decreased mobility,Decreased knowledge of use of DME,Decreased strength,Difficulty walking,Pain,Decreased range of motion,Impaired flexibility  Visit Diagnosis: Chronic pain of left knee  Difficulty in walking, not elsewhere classified  Diabetic ulcer of right midfoot associated with diabetes mellitus due to underlying condition, limited to breakdown of skin [facility])     Problem List Patient Active Problem List   Diagnosis Date Noted  . Gait abnormality 09/30/2020  . Mild non proliferative diabetic retinopathy ([place]) 07/16/2020  . Need for immunization against influenza 06/03/2020  . S/P left knee arthroscopy 05/06/20 05/13/2020  . Abnormal MRI 03/26/2020  . Joint disorder of knee 03/26/2020  . Fall at home, initial encounter 03/13/2020  . Anemia 09/03/2019  . Reflux esophagitis 09/03/2019  . Esophageal dysphagia 05/30/2019  .  Constipation 05/30/2019  . Positive colorectal cancer screening using Cologuard test 05/30/2019  . Diabetic foot ulcer ([place]) 09/06/2018  . Congestive heart failure ([place]) 05/17/2016  . Bilateral carotid artery disease ([place]) 07/08/2014  . Obstructive sleep apnea 02/12/2014  . Restless leg syndrome 10/23/2013  . Overweight with body mass index (BMI) of 29 to 29.9 in adult 10/23/2013  .  Hyperlipidemia 07/16/2013  . Peripheral arterial disease ([place]) 07/16/2013  . Hypertension 03/14/2012  . Type 2 diabetes mellitus with circulatory disorder ([place]) 03/14/2012  . Peripheral neuropathy 03/14/2012  . Cardiomyopathy- EF NL 01/2013, now 30-35% echo 03/14/2012   [name], LPTA/CLT; CBIS [phone]  [name] 12/25/2020, 1:52 PM  [place], [place], [address] Phone: [phone]   Fax:  [phone]  Name: [name] MRN: [id number] Date of Birth: [birth year]

## 2020-12-30 ENCOUNTER — Ambulatory Visit (HOSPITAL_COMMUNITY): Payer: HMO

## 2020-12-30 ENCOUNTER — Other Ambulatory Visit: Payer: Self-pay

## 2020-12-30 DIAGNOSIS — L97411 Non-pressure chronic ulcer of right heel and midfoot limited to breakdown of skin: Secondary | ICD-10-CM

## 2020-12-30 DIAGNOSIS — R262 Difficulty in walking, not elsewhere classified: Secondary | ICD-10-CM

## 2020-12-30 DIAGNOSIS — E08621 Diabetes mellitus due to underlying condition with foot ulcer: Secondary | ICD-10-CM

## 2020-12-30 DIAGNOSIS — G8929 Other chronic pain: Secondary | ICD-10-CM

## 2020-12-30 NOTE — Therapy (Signed)
[place], [place], [address] Phone: [phone]   Fax:  [phone]  Physical Therapy Treatment  Patient Details  Name: [name] MRN: [id number] Date of Birth: [birth year] Referring Provider (PT): Perlie Mayo   Encounter Date: 12/30/2020   PT End of Session - 12/30/20 1155    Visit Number 18    Number of Visits 26    Date for PT Re-Evaluation 01/15/21    Authorization Type [employer], no VL, no auth , $15 co pay    Progress Note Due on Visit 25    PT Start Time 1007    PT Stop Time 1128    PT Time Calculation (min) 81 min    Equipment Utilized During Treatment Gait belt    Activity Tolerance Patient tolerated treatment well;Patient limited by fatigue    Behavior During Therapy [facility] for tasks assessed/performed           Past Medical History:  Diagnosis Date  . Aftercare following surgery of the circulatory system, [place] 12/04/2013  . Angina decubitus ([place]) 05/19/2016  . Arrhythmia 05/20/2016  . Arthritis   . ARTHRITIS, RIGHT FOOT 06/26/2008   Qualifier: Diagnosis of  By: Aline Brochure MD, Dorothyann Peng    . Cardiomyopathy- EF NL 01/2013, now 30-35% echo 03/14/2012  . Carotid artery occlusion    left s/p CEA  . Cellulitis of left foot 05/07/2018  . CHF (congestive heart failure) ([place])   . Critical lower limb ischemia (HCC) 02/28/2014   Critical limb ischemia   . Diabetes mellitus   . Elevated troponin 04/11/2016  . GERD (gastroesophageal reflux disease)   . History of stroke June 2013 03/14/2012  . HOH (hard of hearing)   . Hypertension   . Hypothyroidism   . Nonhealing skin ulcer ([place]) 10/28/2014  . Obstructive sleep apnea   . Orthostatic hypotension   . Osteomyelitis ([place])    left great toe  . Pain in the chest 05/17/2016  . Peripheral arterial disease ([place]), s/p PTA x 2 RLE    nonhealing ulcers bilaterally on each great toe  . Pneumonia   . PONV (postoperative nausea and vomiting)   . Pulmonary  nodule 05/17/2016  . Restless leg syndrome   . Shortness of breath   . Stroke [facility]) March 08, 2012  . Toe osteomyelitis, left ([place])   . Ulcer of great toe, left, with necrosis of bone ([place])   . Wears dentures     Past Surgical History:  Procedure Laterality Date  . AMPUTATION TOE Left 07/25/2018   Procedure: AMPUTATION TOE INTERPHALANGEAL HALLUX LEFT;  Surgeon: Evelina Bucy, DPM;  Location: [place];  Service: Podiatry;  Laterality: Left;  . ANGIOPLASTY  02/28/14   diamond back orbital rotational atherectomy of Rt. tibial  . BACK SURGERY    . BONE BIOPSY Left 07/25/2018   Procedure: SUPERFICIAL BONE BIOPSY;  Surgeon: Evelina Bucy, DPM;  Location: [place];  Service: Podiatry;  Laterality: Left;  . CARDIAC CATHETERIZATION N/A 05/19/2016   Procedure: Right/Left Heart Cath and Coronary Angiography;  Surgeon: Belva Crome, MD;  Location: [place] CV LAB;  Service: Cardiovascular;  Laterality: N/A;  . CERVICAL FUSION    . ENDARTERECTOMY Left 11/29/2013   Procedure: ENDARTERECTOMY CAROTID;  Surgeon: Serafina Mitchell, MD;  Location: [place];  Service: Vascular;  Laterality: Left;  . ESOPHAGOGASTRODUODENOSCOPY  02/2010   Dr. Gala Romney: patient presented with food impaction, schatzki ring with superimposed component of stricture  with erosive reflux esophagitis, s/p disimpaction but dilation planned at later date   . ESOPHAGOGASTRODUODENOSCOPY (EGD) WITH PROPOFOL N/A 08/13/2019   Dr. Gala Romney: Erosive reflux esophagitis with mild stricture and incidental Mallory-Weiss tear which precluded esophageal dilation.  Medium sized hiatal hernia.  [name] ESOPHAGOGASTRODUODENOSCOPY (EGD) WITH PROPOFOL N/A 10/11/2019   Dr. Gala Romney: Esophageal stenosis status post dilation, moderate hiatal hernia  . EYE SURGERY    . FLEXIBLE SIGMOIDOSCOPY N/A 08/13/2019   Procedure: FLEXIBLE SIGMOIDOSCOPY;  Surgeon: [name], MD;  [facility];  Service: Endoscopy;  Laterality: N/A;  colonoscopy aboerted due to formed stool  and poor prep  . FOOT SURGERY    . KNEE ARTHROSCOPY WITH MEDIAL MENISECTOMY Left 05/06/2020   Procedure: KNEE ARTHROSCOPY WITH MEDIAL MENISCECTOMY AND LATERAL MENISCECTOMY;  Surgeon: [name], MD;  Location: [facility];  Service: Orthopedics;  Laterality: Left;  . Lower ext duplex doppler  03/14/14   Rt ABI 1.2  . LOWER EXTREMITY ANGIOGRAM Bilateral 02/18/2014   Procedure: LOWER EXTREMITY ANGIOGRAM;  Surgeon: [name], MD;  Location[facility] CATH LAB;  Service: Cardiovascular;  Laterality: Bilateral;  . LOWER EXTREMITY ANGIOGRAM N/A 10/31/2014   Procedure: LOWER EXTREMITY ANGIOGRAM;  Surgeon: [name], MD;  Location[facility] CATH LAB;  Service: Cardiovascular;  Laterality: N/A;  . Venia Minks DILATION N/A 10/11/2019   Procedure: Venia Minks DILATION;  Surgeon: [name], MD;  [facility];  Service: Endoscopy;  Laterality: N/A;  . MULTIPLE TOOTH EXTRACTIONS    . PV angiogram  02/18/2014   tibial vessel diseas bil.  [name] SPINE SURGERY    . tendon achillies lengthing and sesamoid      There were no vitals filed for this visit.   Subjective Assessment - 12/30/20 1055    Subjective Pt reports plans for a neurostudy on 01/16/21  to address constant falling asleep and belives related to low testosterone.  Pt stated he feels weak LE and UE today.    Pertinent History CHF , R foot wound, left knee scope, DB, Restless leg syndrome, hx of stroke.    Patient Stated Goals be able to walk normally and function correctly.    Currently in Pain? No/denies                           Wound Therapy - 12/30/20 0001    Subjective Pt reports plans for a neurostudy on 01/16/21  to address constant falling asleep and belives related to low testosterone.  Pt stated he feels weak LE and UE today.    Patient and Family Stated Goals to have wound to heal and to be able to walk    Date of Onset --   4-5 years ago   Prior Treatments wound clinics, neosporin on bandaid but bandaid wound  stick.    Pain Scale 0-10    Pain Score 0-No pain    Evaluation and Treatment Procedures Explained to Patient/Family Yes    Evaluation and Treatment Procedures agreed to    Wound Properties Date First Assessed: 10/20/20 Time First Assessed: 1000 Wound Type: Diabetic ulcer Location: Foot Location Orientation: Anterior;Right Wound Description (Comments): bottom of foot Present on Admission: Yes   Wound Image View All Images View Images    Dressing Type Impregnated gauze (bismuth)   vaseline perimeter, xeroform, 2x2, medipore tape   Dressing Changed Changed    Dressing Status Old drainage    Dressing Change Frequency PRN  Site / Wound Assessment Clean    % Wound base Red or Granulating 100%    Peri-wound Assessment Maceration   callous perimeter   Wound Length (cm) 0.8 cm    Wound Width (cm) 0.8 cm    Wound Depth (cm) 0.1 cm    Wound Volume (cm^3) 0.06 cm^3    Wound Surface Area (cm^2) 0.64 cm^2    Drainage Amount Minimal    Drainage Description Serosanguineous    Treatment Cleansed;Debridement (Selective)    Selective Debridement - Location edges and calloused perimeter; expect wound to get larger with debridement    Selective Debridement - Tools Used Forceps;Scissors    Selective Debridement - Tissue Removed callous devitalized tissue.    Wound Therapy - Clinical Statement Pt arrived wearing slip on shoes, reviewed importance of wearing pressure relief shoes, pt stated it was difficult to get heel into shoe and wore slip ons for time.  Selective debriement with removal of callous and dry skin perimeter to promote healing.  Depth is reducing this session.  Continued with xeroform, 2x2 and medipore tape.    Wound Therapy - Functional Problem List difficulty walking, washing, dressing    Factors Delaying/Impairing Wound Healing Diabetes Mellitus;Immobility    Wound Therapy - Frequency 2X / week    Wound Therapy - Current Recommendations PT    Wound Plan Review use of shoe horn to increase  ease with donning pressure relief shoes.  Continue wound care with appropriate dressings.    Dressing  xeroform, vaseline perimeter, 2x2 and medipore tape.            [facility] Adult PT Treatment/Exercise - 12/30/20 0001      Knee/Hip Exercises: Standing   Hip Abduction AROM;Stengthening;Knee bent;2 sets;10 reps    Functional Squat 15 reps    Functional Squat Limitations rollator behind, UE support, cueing for mehcanics    Other Standing Knee Exercises sidestep front of mat 5RT      Knee/Hip Exercises: Supine   Other Supine Knee/Hip Exercises star gazer stretch with UE ER x 2 min for mobility      Knee/Hip Exercises: Prone   Other Prone Exercises Quadruped UE then LE 2 sets 5x 5" holds    Other Prone Exercises quadruped firehydrant 5x 5"                    PT Short Term Goals - 11/19/20 1108      PT SHORT TERM GOAL #1   Title Patient will be independent in self management strategies to improve quality of life and functional outcomes.    Baseline 11/19/20: Reports compliance with HEP daily.    Time 4    Period Weeks    Status On-going      PT SHORT TERM GOAL #2   Title Patient will be able to report at least 25% improvement in overall mobility.    Baseline 11/19/20:  Reports at least 25% improvement    Status Achieved      PT SHORT TERM GOAL #3   Title Patient's wound will be reduced in volume by at least 25% to demonstrate improved wound healing    Baseline was 2.3cm^3, now .15 cm^3    Status Achieved             PT Long Term Goals - 11/19/20 1112      PT LONG TERM GOAL #1   Title Patient will be able to ambulate at least 100 feet with RW in 2 minutes  to demonstrate improved ambulatory endurance    Baseline 11/19/20:  2MWT 121f with rollator, improved foot clearance followng cueing from therapist    Status Achieved      PT LONG TERM GOAL #2   Title Patient's wound will reduce in overall volume byt at least 50% to demonstrate improved wound healing    Baseline  .62 cm^3, was 2.3 cm ^3    Status On-going      PT LONG TERM GOAL #3   Title Patient will be able to demonstate 5-110 degrees in left knee to demonstrate improved knee mobility    Baseline 11/19/20: Lt 18-123 degrees, Rt 5-120 degrees    Status On-going                 Plan - 12/30/20 1200    Clinical Impression Statement Pt c/o increased UE and LE weakness, reports he has lost 50# within 6 months and has neurostudy scheduled later this month to address how fast he fatigues and going to sleep within 56m upon sitting.  Added quadruped exercises for core, gluteal and UE strengthening, pt able to demonstrate appropriate mechanics and given printout to add to HEP.  Added 1# bicep curls paired wiht STS, imporve eccentric control this session.  MIn A/SBA with balance activities for safety    Personal Factors and Comorbidities Comorbidity 1;Comorbidity 3+;Comorbidity 2    Comorbidities CHF, left knee surgery, right chronic foot wound, DB, restless leg syndromes    Examination-Activity Limitations Bathing;Lift;Locomotion Level;Transfers;Stand;Stairs;Squat    Examination-Participation Restrictions Community Activity;Meal Prep    Stability/Clinical Decision Making Evolving/Moderate complexity    Clinical Decision Making Moderate    Rehab Potential Fair    PT Frequency 2x / week    PT Duration --   additional 4 weeks to end 01/08/21   PT Treatment/Interventions ADLs/Self Care Home Management;Other (comment);Moist Heat;Balance training;Therapeutic exercise;Therapeutic activities;Functional mobility training;Stair training;Gait training;DME Instruction;Ultrasound;Neuromuscular re-education;Patient/family education;Orthotic Fit/Training;Manual techniques;Taping;Passive range of motion;Electrical Stimulation    PT Next Visit Plan focus primarily on  standing exercises and balance training continue wound care.  F/U on increased frequency of HEP compliance; work on improving glute strength.    PT Home  Exercise Plan calf stretch with towel, bridges.  2/8: bridge, SLR, hip abduction, hip extension   2/10:  sit to stands; 2/17:  LAQ with wt, supine hip abduction, sidelying hip abduction, 2/21 standing hip abd; 3/2:SAQ; 3/8: long sitting hamstring strech, hip abduction; 4/12: quadruped UE, LE, firehydrant    Consulted and Agree with Plan of Care Patient           Patient will benefit from skilled therapeutic intervention in order to improve the following deficits and impairments:  Abnormal gait,Decreased endurance,Decreased skin integrity,Increased edema,Decreased activity tolerance,Decreased balance,Decreased mobility,Decreased knowledge of use of DME,Decreased strength,Difficulty walking,Pain,Decreased range of motion,Impaired flexibility  Visit Diagnosis: Chronic pain of left knee  Difficulty in walking, not elsewhere classified  Diabetic ulcer of right midfoot associated with diabetes mellitus due to underlying condition, limited to breakdown of skin ([facility]    Problem List Patient Active Problem List   Diagnosis Date Noted  . Gait abnormality 09/30/2020  . Mild non proliferative diabetic retinopathy ([place]10/27/2021  . Need for immunization against influenza 06/03/2020  . S/P left knee arthroscopy 05/06/20 05/13/2020  . Abnormal MRI 03/26/2020  . Joint disorder of knee 03/26/2020  . Fall at home, initial encounter 03/13/2020  . Anemia 09/03/2019  . Reflux esophagitis 09/03/2019  . Esophageal dysphagia 05/30/2019  . Constipation 05/30/2019  .  Positive colorectal cancer screening using Cologuard test 05/30/2019  . Diabetic foot ulcer ([place]) 09/06/2018  . Congestive heart failure ([place]) 05/17/2016  . Bilateral carotid artery disease ([place]) 07/08/2014  . Obstructive sleep apnea 02/12/2014  . Restless leg syndrome 10/23/2013  . Overweight with body mass index (BMI) of 29 to 29.9 in adult 10/23/2013  . Hyperlipidemia 07/16/2013  . Peripheral arterial disease ([place]) 07/16/2013  .  Hypertension 03/14/2012  . Type 2 diabetes mellitus with circulatory disorder ([place]) 03/14/2012  . Peripheral neuropathy 03/14/2012  . Cardiomyopathy- EF NL 01/2013, now 30-35% echo 03/14/2012   [name], LPTA/CLT; CBIS [phone]  [name] 12/30/2020, 1:13 PM  [place], [place], [address] Phone: [phone]   Fax:  [phone]  Name: [name] MRN: [id number] Date of Birth: Jan 25, 1946

## 2021-01-01 ENCOUNTER — Other Ambulatory Visit: Payer: Self-pay

## 2021-01-01 ENCOUNTER — Ambulatory Visit (HOSPITAL_COMMUNITY): Payer: HMO

## 2021-01-01 DIAGNOSIS — L97411 Non-pressure chronic ulcer of right heel and midfoot limited to breakdown of skin: Secondary | ICD-10-CM

## 2021-01-01 DIAGNOSIS — R262 Difficulty in walking, not elsewhere classified: Secondary | ICD-10-CM | POA: Diagnosis not present

## 2021-01-01 DIAGNOSIS — G8929 Other chronic pain: Secondary | ICD-10-CM

## 2021-01-01 DIAGNOSIS — E08621 Diabetes mellitus due to underlying condition with foot ulcer: Secondary | ICD-10-CM

## 2021-01-01 DIAGNOSIS — M25562 Pain in left knee: Secondary | ICD-10-CM

## 2021-01-01 NOTE — Therapy (Signed)
[place], [place], [address] Phone: [phone]   Fax:  [phone]  Wound Care Therapy  Patient Details  Name: [name] MRN: [id number] Date of Birth: 1946-06-28 Referring Provider (PT): Perlie Mayo   Encounter Date: 01/01/2021   PT End of Session - 01/01/21 1128    Visit Number 19    Number of Visits 26    Date for PT Re-Evaluation 01/15/21    Authorization Type [employer], no VL, no auth , $15 co pay    Progress Note Due on Visit 25    PT Start Time 1055   restroom break at beginning of session   PT Stop Time 1207    PT Time Calculation (min) 72 min    Equipment Utilized During Treatment Gait belt    Activity Tolerance Patient tolerated treatment well;Patient limited by fatigue    Behavior During Therapy [facility] for tasks assessed/performed           Past Medical History:  Diagnosis Date  . Aftercare following surgery of the circulatory system, [place] 12/04/2013  . Angina decubitus ([place]) 05/19/2016  . Arrhythmia 05/20/2016  . Arthritis   . ARTHRITIS, RIGHT FOOT 06/26/2008   Qualifier: Diagnosis of  By: Aline Brochure MD, Dorothyann Peng    . Cardiomyopathy- EF NL 01/2013, now 30-35% echo 03/14/2012  . Carotid artery occlusion    left s/p CEA  . Cellulitis of left foot 05/07/2018  . CHF (congestive heart failure) ([place])   . Critical lower limb ischemia (HCC) 02/28/2014   Critical limb ischemia   . Diabetes mellitus   . Elevated troponin 04/11/2016  . GERD (gastroesophageal reflux disease)   . History of stroke June 2013 03/14/2012  . HOH (hard of hearing)   . Hypertension   . Hypothyroidism   . Nonhealing skin ulcer ([place]) 10/28/2014  . Obstructive sleep apnea   . Orthostatic hypotension   . Osteomyelitis ([place])    left great toe  . Pain in the chest 05/17/2016  . Peripheral arterial disease ([place]), s/p PTA x 2 RLE    nonhealing ulcers bilaterally on each great toe  . Pneumonia   . PONV (postoperative  nausea and vomiting)   . Pulmonary nodule 05/17/2016  . Restless leg syndrome   . Shortness of breath   . Stroke [facility]) March 08, 2012  . Toe osteomyelitis, left ([place])   . Ulcer of great toe, left, with necrosis of bone ([place])   . Wears dentures     Past Surgical History:  Procedure Laterality Date  . AMPUTATION TOE Left 07/25/2018   Procedure: AMPUTATION TOE INTERPHALANGEAL HALLUX LEFT;  Surgeon: Evelina Bucy, DPM;  Location: [facility];  Service: Podiatry;  Laterality: Left;  . ANGIOPLASTY  02/28/14   diamond back orbital rotational atherectomy of Rt. tibial  . BACK SURGERY    . BONE BIOPSY Left 07/25/2018   Procedure: SUPERFICIAL BONE BIOPSY;  Surgeon: Evelina Bucy, DPM;  Location: [facility];  Service: Podiatry;  Laterality: Left;  . CARDIAC CATHETERIZATION N/A 05/19/2016   Procedure: Right/Left Heart Cath and Coronary Angiography;  Surgeon: Belva Crome, MD;  Location: [facility];  Service: Cardiovascular;  Laterality: N/A;  . CERVICAL FUSION    . ENDARTERECTOMY Left 11/29/2013   Procedure: ENDARTERECTOMY CAROTID;  Surgeon: Serafina Mitchell, MD;  Location: [facility];  Service: Vascular;  Laterality: Left;  . ESOPHAGOGASTRODUODENOSCOPY  02/2010   Dr. Gala Romney: patient presented with food impaction,  schatzki ring with superimposed component of stricture with erosive reflux esophagitis, s/p disimpaction but dilation planned at later date   . ESOPHAGOGASTRODUODENOSCOPY (EGD) WITH PROPOFOL N/A 08/13/2019   Dr. Gala Romney: Erosive reflux esophagitis with mild stricture and incidental Mallory-Weiss tear which precluded esophageal dilation.  Medium sized hiatal hernia.  [name] ESOPHAGOGASTRODUODENOSCOPY (EGD) WITH PROPOFOL N/A 10/11/2019   Dr. Gala Romney: Esophageal stenosis status post dilation, moderate hiatal hernia  . EYE SURGERY    . FLEXIBLE SIGMOIDOSCOPY N/A 08/13/2019   Procedure: FLEXIBLE SIGMOIDOSCOPY;  Surgeon: [name], MD;  [facility];  Service: Endoscopy;  Laterality: N/A;   colonoscopy aboerted due to formed stool and poor prep  . FOOT SURGERY    . KNEE ARTHROSCOPY WITH MEDIAL MENISECTOMY Left 05/06/2020   Procedure: KNEE ARTHROSCOPY WITH MEDIAL MENISCECTOMY AND LATERAL MENISCECTOMY;  Surgeon: [name], MD;  Location: [facility];  Service: Orthopedics;  Laterality: Left;  . Lower ext duplex doppler  03/14/14   Rt ABI 1.2  . LOWER EXTREMITY ANGIOGRAM Bilateral 02/18/2014   Procedure: LOWER EXTREMITY ANGIOGRAM;  Surgeon: [name], MD;  [facility];  Service: Cardiovascular;  Laterality: Bilateral;  . LOWER EXTREMITY ANGIOGRAM N/A 10/31/2014   Procedure: LOWER EXTREMITY ANGIOGRAM;  Surgeon: [name], MD;  Location: [facility] CATH LAB;  Service: Cardiovascular;  Laterality: N/A;  . Venia Minks DILATION N/A 10/11/2019   Procedure: Venia Minks DILATION;  Surgeon: [name], MD;  [facility];  Service: Endoscopy;  Laterality: N/A;  . MULTIPLE TOOTH EXTRACTIONS    . PV angiogram  02/18/2014   tibial vessel diseas bil.  [name] SPINE SURGERY    . tendon achillies lengthing and sesamoid      There were no vitals filed for this visit.    Subjective Assessment - 01/01/21 1218    Subjective Pt stated he is feeling good today, no reports of pain or recent fall.    Pertinent History CHF , R foot wound, left knee scope, DB, Restless leg syndrome, hx of stroke.    Patient Stated Goals be able to walk normally and function correctly.    Currently in Pain? No/denies                     Wound Therapy - 01/01/21 0001    Subjective Pt stated he is feeling good today, no reports of pain or recent fall.    Patient and Family Stated Goals to have wound to heal and to be able to walk    Date of Onset --   4-5 years ago   Prior Treatments wound clinics, neosporin on bandaid but bandaid wound stick.    Pain Scale 0-10    Pain Score 0-No pain    Evaluation and Treatment Procedures Explained to Patient/Family Yes    Evaluation and Treatment  Procedures agreed to    Wound Properties Date First Assessed: 10/20/20 Time First Assessed: 1000 Wound Type: Diabetic ulcer Location: Foot Location Orientation: Anterior;Right Wound Description (Comments): bottom of foot Present on Admission: Yes   Wound Image View All Images View Images    Dressing Type Impregnated gauze (bismuth);Gauze (Comment)   medihoney wound bed, xeroform top, 2x2, medipore tape   Dressing Changed Changed    Dressing Status Old drainage    Dressing Change Frequency PRN    Site / Wound Assessment Clean    % Wound base Red or Granulating 100%    Peri-wound Assessment --   callous  Wound Length (cm) 0.7 cm    Wound Width (cm) 0.8 cm    Wound Depth (cm) 0.1 cm    Wound Volume (cm^3) 0.06 cm^3    Wound Surface Area (cm^2) 0.56 cm^2    Drainage Amount Minimal    Drainage Description Serosanguineous    Treatment Cleansed;Debridement (Selective)    Selective Debridement - Location edges and calloused perimeter; expect wound to get larger with debridement    Selective Debridement - Tools Used Forceps;Scissors;Scalpel    Selective Debridement - Tissue Removed callous devitalized tissue.    Wound Therapy - Clinical Statement Pt wore pressure relief shoes today, reviewed use of shoe horn for assistance.  Wound progressing well wtih minimal debridement and callous only from 11-3:00.  Selective debridement for removal of callous perimeter and cross hatching complete on wound bed.  Continues wiht medihoney on wound bed and xeroform on top to soften callous perimeter, 2x2 and medipore tape.    Wound Therapy - Functional Problem List difficulty walking, washing, dressing    Factors Delaying/Impairing Wound Healing Diabetes Mellitus;Immobility    Wound Therapy - Frequency 2X / week    Wound Therapy - Current Recommendations PT    Wound Plan Continue wound care with appropriate dressings.    Dressing  medihoney wound bed, xeroform on top, vaseline perimeter, 2x2 and medipore tape.            [facility] [place] [facility] Adult PT Treatment/Exercise - 01/01/21 0001      Knee/Hip Exercises: Standing   Heel Raises Both;20 reps    Heel Raises Limitations toeraises 20X    Hip Abduction AROM;Stengthening;20 reps;Knee straight    Abduction Limitations 2#    Hip Extension Stengthening;Both;20 reps;Knee straight    Extension Limitations 2#, 5" holds    Functional Squat 15 reps    Functional Squat Limitations front of mat, 1# Bil UE no HHA    Other Standing Knee Exercises sidestep front of mat 5RT    Other Standing Knee Exercises NBOS RTB shoulder extension 2x 10      Knee/Hip Exercises: Seated   Sit to Sand 10 reps;without UE support   1# bicep curl upon standing                   PT Short Term Goals - 11/19/20 1108      PT SHORT TERM GOAL #1   Title Patient will be independent in self management strategies to improve quality of life and functional outcomes.    Baseline 11/19/20: Reports compliance with HEP daily.    Time 4    Period Weeks    Status On-going      PT SHORT TERM GOAL #2   Title Patient will be able to report at least 25% improvement in overall mobility.    Baseline 11/19/20:  Reports at least 25% improvement    Status Achieved      PT SHORT TERM GOAL #3   Title Patient's wound will be reduced in volume by at least 25% to demonstrate improved wound healing    Baseline was 2.3cm^3, now .40 cm^3    Status Achieved             PT Long Term Goals - 11/19/20 1112      PT LONG TERM GOAL #1   Title Patient will be able to ambulate at least 100 feet with RW in 2 minutes to demonstrate improved ambulatory endurance    Baseline 11/19/20:  2MWT 157ft with rollator, improved foot clearance followng  cueing from therapist    Status Achieved      PT LONG TERM GOAL #2   Title Patient's wound will reduce in overall volume byt at least 50% to demonstrate improved wound healing    Baseline .62 cm^3, was 2.3 cm ^3    Status On-going      PT LONG TERM GOAL #3   Title  Patient will be able to demonstate 5-110 degrees in left knee to demonstrate improved knee mobility    Baseline 11/19/20: Lt 18-123 degrees, Rt 5-120 degrees    Status On-going                 Plan - 01/01/21 1519    Clinical Impression Statement Began session with wound care then progressed to functional strengthening with primary focus on hip strengthening and balance training.  Added 2# for hip strengthening with hip abd/ext, cueing for posture through session.  Pt with tendency to lean to Lt upon rest breaks, educated importance of good posture for maximal strengthening.  Able to complete STS without HHA from 20in height and good eccentric control.  Squats complete without HHA and good mechanics.  Added vector stance for balance and hip strengthening with tactile cueing for posture during exercises, did require BUE support.  Also did shoulder extension for postural strengthening with NBOS, SBA for safety.    Personal Factors and Comorbidities Comorbidity 1;Comorbidity 3+;Comorbidity 2    Comorbidities CHF, left knee surgery, right chronic foot wound, DB, restless leg syndromes    Examination-Activity Limitations Bathing;Lift;Locomotion Level;Transfers;Stand;Stairs;Squat    Examination-Participation Restrictions Community Activity;Meal Prep    Stability/Clinical Decision Making Evolving/Moderate complexity    Clinical Decision Making Moderate    Rehab Potential Fair    PT Frequency 2x / week    PT Duration --   additional 4 weeks to end 01/08/21   PT Treatment/Interventions ADLs/Self Care Home Management;Other (comment);Moist Heat;Balance training;Therapeutic exercise;Therapeutic activities;Functional mobility training;Stair training;Gait training;DME Instruction;Ultrasound;Neuromuscular re-education;Patient/family education;Orthotic Fit/Training;Manual techniques;Taping;Passive range of motion;Electrical Stimulation    PT Next Visit Plan focus primarily on  standing exercises and balance  training continue wound care.  F/U on increased frequency of HEP compliance; work on improving glute strength.    PT Home Exercise Plan calf stretch with towel, bridges.  2/8: bridge, SLR, hip abduction, hip extension   2/10:  sit to stands; 2/17:  LAQ with wt, supine hip abduction, sidelying hip abduction, 2/21 standing hip abd; 3/2:SAQ; 3/8: long sitting hamstring strech, hip abduction; 4/12: quadruped UE, LE, firehydrant    Consulted and Agree with Plan of Care Patient           Patient will benefit from skilled therapeutic intervention in order to improve the following deficits and impairments:  Abnormal gait,Decreased endurance,Decreased skin integrity,Increased edema,Decreased activity tolerance,Decreased balance,Decreased mobility,Decreased knowledge of use of DME,Decreased strength,Difficulty walking,Pain,Decreased range of motion,Impaired flexibility  Visit Diagnosis: Chronic pain of left knee  Difficulty in walking, not elsewhere classified  Diabetic ulcer of right midfoot associated with diabetes mellitus due to underlying condition, limited to breakdown of skin [facility])     Problem List Patient Active Problem List   Diagnosis Date Noted  . Gait abnormality 09/30/2020  . Mild non proliferative diabetic retinopathy ([place]) 07/16/2020  . Need for immunization against influenza 06/03/2020  . S/P left knee arthroscopy 05/06/20 05/13/2020  . Abnormal MRI 03/26/2020  . Joint disorder of knee 03/26/2020  . Fall at home, initial encounter 03/13/2020  . Anemia 09/03/2019  . Reflux esophagitis 09/03/2019  .  Esophageal dysphagia 05/30/2019  . Constipation 05/30/2019  . Positive colorectal cancer screening using Cologuard test 05/30/2019  . Diabetic foot ulcer ([place]) 09/06/2018  . Congestive heart failure ([place]) 05/17/2016  . Bilateral carotid artery disease ([place]) 07/08/2014  . Obstructive sleep apnea 02/12/2014  . Restless leg syndrome 10/23/2013  . Overweight with body mass index  (BMI) of 29 to 29.9 in adult 10/23/2013  . Hyperlipidemia 07/16/2013  . Peripheral arterial disease ([place]) 07/16/2013  . Hypertension 03/14/2012  . Type 2 diabetes mellitus with circulatory disorder ([place]) 03/14/2012  . Peripheral neuropathy 03/14/2012  . Cardiomyopathy- EF NL 01/2013, now 30-35% echo 03/14/2012   [name], LPTA/CLT; CBIS [phone]  [name] 01/01/2021, 3:26 PM  [facility] [address] [place], [place], [address] Phone: [phone]   Fax:  [phone]  Name: [name] MRN: [id number] Date of Birth: [birth year]

## 2021-01-05 ENCOUNTER — Other Ambulatory Visit: Payer: Self-pay

## 2021-01-05 DIAGNOSIS — I83893 Varicose veins of bilateral lower extremities with other complications: Secondary | ICD-10-CM

## 2021-01-06 ENCOUNTER — Ambulatory Visit (HOSPITAL_COMMUNITY): Payer: HMO | Admitting: Physical Therapy

## 2021-01-06 ENCOUNTER — Other Ambulatory Visit: Payer: Self-pay

## 2021-01-06 DIAGNOSIS — L97411 Non-pressure chronic ulcer of right heel and midfoot limited to breakdown of skin: Secondary | ICD-10-CM

## 2021-01-06 DIAGNOSIS — R262 Difficulty in walking, not elsewhere classified: Secondary | ICD-10-CM

## 2021-01-06 DIAGNOSIS — G8929 Other chronic pain: Secondary | ICD-10-CM

## 2021-01-06 DIAGNOSIS — E08621 Diabetes mellitus due to underlying condition with foot ulcer: Secondary | ICD-10-CM

## 2021-01-06 NOTE — Therapy (Signed)
[place], [place], [address] Phone: [phone]   Fax:  [phone]  Wound Care Therapy  Patient Details  Name: [name] MRN: [id number] Date of Birth: 1946-01-06 Referring Provider (PT): Perlie Mayo   Encounter Date: 01/06/2021   PT End of Session - 01/06/21 1104    Visit Number 20    Number of Visits 26    Date for PT Re-Evaluation 01/15/21    Authorization Type [employer], no VL, no auth , $15 co pay    Progress Note Due on Visit 25    PT Start Time 1058    PT Stop Time 1215    PT Time Calculation (min) 77 min    Equipment Utilized During Treatment Gait belt    Activity Tolerance Patient tolerated treatment well;Patient limited by fatigue    Behavior During Therapy [facility] for tasks assessed/performed           Past Medical History:  Diagnosis Date  . Aftercare following surgery of the circulatory system, [place] 12/04/2013  . Angina decubitus ([place]) 05/19/2016  . Arrhythmia 05/20/2016  . Arthritis   . ARTHRITIS, RIGHT FOOT 06/26/2008   Qualifier: Diagnosis of  By: Aline Brochure MD, Dorothyann Peng    . Cardiomyopathy- EF NL 01/2013, now 30-35% echo 03/14/2012  . Carotid artery occlusion    left s/p CEA  . Cellulitis of left foot 05/07/2018  . CHF (congestive heart failure) ([place])   . Critical lower limb ischemia (HCC) 02/28/2014   Critical limb ischemia   . Diabetes mellitus   . Elevated troponin 04/11/2016  . GERD (gastroesophageal reflux disease)   . History of stroke June 2013 03/14/2012  . HOH (hard of hearing)   . Hypertension   . Hypothyroidism   . Nonhealing skin ulcer ([place]) 10/28/2014  . Obstructive sleep apnea   . Orthostatic hypotension   . Osteomyelitis ([place])    left great toe  . Pain in the chest 05/17/2016  . Peripheral arterial disease ([place]), s/p PTA x 2 RLE    nonhealing ulcers bilaterally on each great toe  . Pneumonia   . PONV (postoperative nausea and vomiting)   . Pulmonary nodule  05/17/2016  . Restless leg syndrome   . Shortness of breath   . Stroke [facility]) March 08, 2012  . Toe osteomyelitis, left ([place])   . Ulcer of great toe, left, with necrosis of bone ([place])   . Wears dentures     Past Surgical History:  Procedure Laterality Date  . AMPUTATION TOE Left 07/25/2018   Procedure: AMPUTATION TOE INTERPHALANGEAL HALLUX LEFT;  Surgeon: Evelina Bucy, DPM;  Location: [place];  Service: Podiatry;  Laterality: Left;  . ANGIOPLASTY  02/28/14   diamond back orbital rotational atherectomy of Rt. tibial  . BACK SURGERY    . BONE BIOPSY Left 07/25/2018   Procedure: SUPERFICIAL BONE BIOPSY;  Surgeon: Evelina Bucy, DPM;  Location: [place];  Service: Podiatry;  Laterality: Left;  . CARDIAC CATHETERIZATION N/A 05/19/2016   Procedure: Right/Left Heart Cath and Coronary Angiography;  Surgeon: Belva Crome, MD;  Location: [place] CV LAB;  Service: Cardiovascular;  Laterality: N/A;  . CERVICAL FUSION    . ENDARTERECTOMY Left 11/29/2013   Procedure: ENDARTERECTOMY CAROTID;  Surgeon: Serafina Mitchell, MD;  Location: [place];  Service: Vascular;  Laterality: Left;  . ESOPHAGOGASTRODUODENOSCOPY  02/2010   Dr. Gala Romney: patient presented with food impaction, schatzki ring with superimposed component of stricture  with erosive reflux esophagitis, s/p disimpaction but dilation planned at later date   . ESOPHAGOGASTRODUODENOSCOPY (EGD) WITH PROPOFOL N/A 08/13/2019   Dr. Gala Romney: Erosive reflux esophagitis with mild stricture and incidental Mallory-Weiss tear which precluded esophageal dilation.  Medium sized hiatal hernia.  [name] ESOPHAGOGASTRODUODENOSCOPY (EGD) WITH PROPOFOL N/A 10/11/2019   Dr. Gala Romney: Esophageal stenosis status post dilation, moderate hiatal hernia  . EYE SURGERY    . FLEXIBLE SIGMOIDOSCOPY N/A 08/13/2019   Procedure: FLEXIBLE SIGMOIDOSCOPY;  Surgeon: [name], MD;  [facility];  Service: Endoscopy;  Laterality: N/A;  colonoscopy aboerted due to formed stool and  poor prep  . FOOT SURGERY    . KNEE ARTHROSCOPY WITH MEDIAL MENISECTOMY Left 05/06/2020   Procedure: KNEE ARTHROSCOPY WITH MEDIAL MENISCECTOMY AND LATERAL MENISCECTOMY;  Surgeon: [name], MD;  Location: [facility];  Service: Orthopedics;  Laterality: Left;  . Lower ext duplex doppler  03/14/14   Rt ABI 1.2  . LOWER EXTREMITY ANGIOGRAM Bilateral 02/18/2014   Procedure: LOWER EXTREMITY ANGIOGRAM;  Surgeon: [name], MD;  Location: [facility] CATH LAB;  Service: Cardiovascular;  Laterality: Bilateral;  . LOWER EXTREMITY ANGIOGRAM N/A 10/31/2014   Procedure: LOWER EXTREMITY ANGIOGRAM;  Surgeon: [name], MD;  Location: [facility] CATH LAB;  Service: Cardiovascular;  Laterality: N/A;  . Venia Minks DILATION N/A 10/11/2019   Procedure: Venia Minks DILATION;  Surgeon: [name], MD;  [facility];  Service: Endoscopy;  Laterality: N/A;  . MULTIPLE TOOTH EXTRACTIONS    . PV angiogram  02/18/2014   tibial vessel diseas bil.  [name] SPINE SURGERY    . tendon achillies lengthing and sesamoid      There were no vitals filed for this visit.    Subjective Assessment - 01/06/21 1105    Subjective Pt came early for appt and left to walk outside; returned 10 minutes past appt tiime so got a late start.  Pt reports he does not do his exercises as instructed, maybe 3X week but not daily.  STates his restless leg is acting up today and that's why he left to walk  but no pain otherwise.    Currently in Pain? No/denies                     Wound Therapy - 01/06/21 1101    Subjective pt without any issues.  Wearing his bedroom shoes today becuase he needs to fix the laces on is new shoes with the orthopedic pressure relief soles    Patient and Family Stated Goals to have wound to heal and to be able to walk    Date of Onset --   4-5 years ago   Prior Treatments wound clinics, neosporin on bandaid but bandaid wound stick.    Pain Scale 0-10    Pain Score 0-No pain    Evaluation and Treatment  Procedures Explained to Patient/Family Yes    Evaluation and Treatment Procedures agreed to    Wound Properties Date First Assessed: 10/20/20 Time First Assessed: 1000 Wound Type: Diabetic ulcer Location: Foot Location Orientation: Anterior;Right Wound Description (Comments): bottom of foot Present on Admission: Yes   Dressing Type Impregnated gauze (bismuth)    Dressing Changed Changed    Dressing Status Old drainage    Dressing Change Frequency PRN    Site / Wound Assessment Clean    % Wound base Red or Granulating 100%    Drainage Amount Minimal    Drainage Description Serosanguineous  Treatment Cleansed;Debridement (Selective)    Selective Debridement - Location edges and calloused perimeter; expect wound to get larger with debridement    Selective Debridement - Tools Used Forceps;Scissors;Scalpel    Selective Debridement - Tissue Removed callous devitalized tissue.    Wound Therapy - Clinical Statement Wound with superior border not attached.  Able to debride this away, thus making wound appear larger but with better edges to encourage approximation.  Wound remains granulated otherwise.  Contiued with medihoney and xerform.  Encouragted return of specialized shoe asap to help reduce pressure and encourage healing.    Wound Therapy - Functional Problem List difficulty walking, washing, dressing    Factors Delaying/Impairing Wound Healing Diabetes Mellitus;Immobility    Wound Therapy - Frequency 2X / week    Wound Therapy - Current Recommendations PT    Wound Plan Continue wound care with appropriate dressings.    Dressing  medihoney wound bed, xeroform on top, vaseline perimeter, 2x2 and medipore tape.           [facility] Adult PT Treatment/Exercise - 01/06/21 0001      Knee/Hip Exercises: Standing   Hip Abduction AROM;Stengthening;20 reps;Knee straight    Abduction Limitations 2#    Hip Extension Stengthening;Both;20 reps;Knee straight    Extension Limitations 2#, 5" holds     Functional Squat 15 reps    Functional Squat Limitations front of mat, 1# Bil UE no HHA    SLS with Vectors 10X3" holds each way with 1 HHA    Other Standing Knee Exercises sidestep front of mat 5RT      Knee/Hip Exercises: Seated   Sit to Sand 15 reps;without UE support                    PT Short Term Goals - 11/19/20 1108      PT SHORT TERM GOAL #1   Title Patient will be independent in self management strategies to improve quality of life and functional outcomes.    Baseline 11/19/20: Reports compliance with HEP daily.    Time 4    Period Weeks    Status On-going      PT SHORT TERM GOAL #2   Title Patient will be able to report at least 25% improvement in overall mobility.    Baseline 11/19/20:  Reports at least 25% improvement    Status Achieved      PT SHORT TERM GOAL #3   Title Patient's wound will be reduced in volume by at least 25% to demonstrate improved wound healing    Baseline was 2.3cm^3, now .63 cm^3    Status Achieved             PT Long Term Goals - 11/19/20 1112      PT LONG TERM GOAL #1   Title Patient will be able to ambulate at least 100 feet with RW in 2 minutes to demonstrate improved ambulatory endurance    Baseline 11/19/20:  2MWT 184ft with rollator, improved foot clearance followng cueing from therapist    Status Achieved      PT LONG TERM GOAL #2   Title Patient's wound will reduce in overall volume byt at least 50% to demonstrate improved wound healing    Baseline .62 cm^3, was 2.3 cm ^3    Status On-going      PT LONG TERM GOAL #3   Title Patient will be able to demonstate 5-110 degrees in left knee to demonstrate improved knee mobility  Baseline 11/19/20: Lt 18-123 degrees, Rt 5-120 degrees    Status On-going                 Plan - 01/06/21 1206    Clinical Impression Statement Pt with multiple questions regarding his HEP, which ones to do/not do and how often to do it.  Discussed HEP and explained to patient at 20  visits he should be doing these regularly and know which ones he is to do for HEP.   Pt admits to not doing these regularly and remembering which ones.   Reviewed them all and instructed to complete 2X daily. Given additional sheets for vector stance, standing hip abduction, march and extension as requested by patient.  Pt with much improved control with descent to chair, however requires constant cues for posture during standing exercises and engaging Lt glute with activities.  Continues to use rollator for ambulation and cues to keep feet separated with upright posturing.    Personal Factors and Comorbidities Comorbidity 1;Comorbidity 3+;Comorbidity 2    Comorbidities CHF, left knee surgery, right chronic foot wound, DB, restless leg syndromes    Examination-Activity Limitations Bathing;Lift;Locomotion Level;Transfers;Stand;Stairs;Squat    Examination-Participation Restrictions Community Activity;Meal Prep    Stability/Clinical Decision Making Evolving/Moderate complexity    Rehab Potential Fair    PT Frequency 2x / week    PT Duration --   additional 4 weeks to end 01/08/21   PT Treatment/Interventions ADLs/Self Care Home Management;Other (comment);Moist Heat;Balance training;Therapeutic exercise;Therapeutic activities;Functional mobility training;Stair training;Gait training;DME Instruction;Ultrasound;Neuromuscular re-education;Patient/family education;Orthotic Fit/Training;Manual techniques;Taping;Passive range of motion;Electrical Stimulation    PT Next Visit Plan focus primarily on  standing exercises and balance training continue wound care.  F/U on increased frequency of HEP compliance; work on improving glute strength.    PT Home Exercise Plan calf stretch with towel, bridges.  2/8: bridge, SLR, hip abduction, hip extension   2/10:  sit to stands; 2/17:  LAQ with wt, supine hip abduction, sidelying hip abduction, 2/21 standing hip abd; 3/2:SAQ; 3/8: long sitting hamstring strech, hip abduction;  4/12: quadruped UE, LE, firehydrant  4/19: vectors, standing hip abd/ext/march    Consulted and Agree with Plan of Care Patient           Patient will benefit from skilled therapeutic intervention in order to improve the following deficits and impairments:  Abnormal gait,Decreased endurance,Decreased skin integrity,Increased edema,Decreased activity tolerance,Decreased balance,Decreased mobility,Decreased knowledge of use of DME,Decreased strength,Difficulty walking,Pain,Decreased range of motion,Impaired flexibility  Visit Diagnosis: Chronic pain of left knee  Difficulty in walking, not elsewhere classified  Diabetic ulcer of right midfoot associated with diabetes mellitus due to underlying condition, limited to breakdown of skin [facility])     Problem List Patient Active Problem List   Diagnosis Date Noted  . Gait abnormality 09/30/2020  . Mild non proliferative diabetic retinopathy ([place]) 07/16/2020  . Need for immunization against influenza 06/03/2020  . S/P left knee arthroscopy 05/06/20 05/13/2020  . Abnormal MRI 03/26/2020  . Joint disorder of knee 03/26/2020  . Fall at home, initial encounter 03/13/2020  . Anemia 09/03/2019  . Reflux esophagitis 09/03/2019  . Esophageal dysphagia 05/30/2019  . Constipation 05/30/2019  . Positive colorectal cancer screening using Cologuard test 05/30/2019  . Diabetic foot ulcer ([place]) 09/06/2018  . Congestive heart failure ([place]) 05/17/2016  . Bilateral carotid artery disease ([place]) 07/08/2014  . Obstructive sleep apnea 02/12/2014  . Restless leg syndrome 10/23/2013  . Overweight with body mass index (BMI) of 29 to 29.9 in adult 10/23/2013  .  Hyperlipidemia 07/16/2013  . Peripheral arterial disease ([place]) 07/16/2013  . Hypertension 03/14/2012  . Type 2 diabetes mellitus with circulatory disorder ([place]) 03/14/2012  . Peripheral neuropathy 03/14/2012  . Cardiomyopathy- EF NL 01/2013, now 30-35% echo 03/14/2012   Teena Irani,  PTA/CLT [phone]  Teena Irani 01/06/2021, 1:02 PM  [place], [place], [address] Phone: [phone]   Fax:  [phone]  Name: BENJAMAN ARTMAN MRN: [id number] Date of Birth: 1946-03-23

## 2021-01-06 NOTE — Patient Instructions (Signed)
FUNCTIONAL MOBILITY: Marching - Standing    March in place by lifting left leg up, then right. Alternate.  _15__ reps per set, __2_ sets per day, _7__ days per week Hold onto a support.  ABDUCTION: Standing (Active)    Stand, feet flat. Lift right leg out to side. Use _2-3__ lbs.  Complete_15__ repetitions. Perform _2__ sessions per day.    EXTENSION: Standing (Active)    Stand, both feet flat. Draw right leg behind body as far as possible. Use _2-3__ lbs.  Complete _15__ repetitions. Perform _2__ sessions per day.

## 2021-01-07 ENCOUNTER — Ambulatory Visit: Payer: HMO | Admitting: Physician Assistant

## 2021-01-07 ENCOUNTER — Ambulatory Visit (HOSPITAL_COMMUNITY)
Admission: RE | Admit: 2021-01-07 | Discharge: 2021-01-07 | Disposition: A | Discharge status: Home / Self Care | Payer: HMO | Source: Ambulatory Visit | Attending: Vascular Surgery | Admitting: Vascular Surgery

## 2021-01-07 ENCOUNTER — Encounter (HOSPITAL_COMMUNITY): Payer: HMO

## 2021-01-07 VITALS — BP 91/54 | HR 88 | Temp 98.1°F | Resp 20 | Ht 72.0 in | Wt 195.6 lb

## 2021-01-07 DIAGNOSIS — I83893 Varicose veins of bilateral lower extremities with other complications: Secondary | ICD-10-CM | POA: Diagnosis not present

## 2021-01-07 DIAGNOSIS — M7989 Other specified soft tissue disorders: Secondary | ICD-10-CM | POA: Diagnosis not present

## 2021-01-07 NOTE — Progress Notes (Signed)
VASCULAR & VEIN SPECIALISTS           OF [place]  History and Physical   [name] is a 75 y.o. male who presents with leg swelling, restless leg syndrome cramping in his legs at night.  He recently saw his PCP and had c/o cramping in both legs with varicose veins and is referred for evaluation.  He states that over the past month and a half, he has to get up at night due to taking a diuretic and he feels like there is "a box around the ankles"  It started in the right but also can happen on the left.  He states that he does get cramping in his legs at night and he eats mustard and this relieves the cramps.  He does not elevate his legs.  He does not wear compression.  He states that he had a bunch of lumps on his legs and that is why he is referred to our office.  He states that his legs swell.  He states that he does have heart failure.  He says that Dr. Gwenlyn Found is keeping a close eye on his PAD and carotids.  He has not had hx of DVT.  He states that about 8 months ago, he had a fall and had to undergo surgery on his left knee and since then, he has had a 60 lb weight loss.  He states that he is sure some of it is from the surgery as he was down and out for 4 months but has not had an appetite since then.  He states he has jerking of his extremities and has been referred to neurology but has not been to that appt yet.   Pt has hx of left CEA in March 2015 by Dr. [name].  He has has hx of "roto rooter" of right leg by Dr. Gwenlyn Found.  Dr. Gwenlyn Found is continuing surveillance for both of these issues.  He has hx of stroke with aphasia in 2013 or 14.  He has hx of cardiac cath in 2017 Diffuse moderate to severe LAD disease. The most severe region is in the mid to distal vessel and is somewhat eccentric with up to 80% obstruction.Widely patent RCA and circumflex. This is being medically treated.He does have a diabetic foot wound on the plantar aspect of the right foot that he says has been  present for a couple of years after surgery on the great toe.  He also has hx of left great toe amputation.  This wound is being followed by wound care.   His ABI in December 2021 were 1.27 right and 1.17 left with toe pressures of 92 on the right and 75 on the left.  His carotid duplex looked good on last u/s.    The pt is on a statin for cholesterol management.  The pt is on a daily aspirin.   Other AC:  Plavix The pt is on BB for hypertension.   The pt is diabetic.   Tobacco hx:  former   Past Medical History:  Diagnosis Date  . Aftercare following surgery of the circulatory system, [place] 12/04/2013  . Angina decubitus ([place]) 05/19/2016  . Arrhythmia 05/20/2016  . Arthritis   . ARTHRITIS, RIGHT FOOT 06/26/2008   Qualifier: Diagnosis of  By: Aline Brochure MD, Dorothyann Peng    . Cardiomyopathy- EF NL 01/2013, now 30-35% echo 03/14/2012  . Carotid artery occlusion    left s/p  CEA  . Cellulitis of left foot 05/07/2018  . CHF (congestive heart failure) ([place])   . Critical lower limb ischemia (HCC) 02/28/2014   Critical limb ischemia   . Diabetes mellitus   . Elevated troponin 04/11/2016  . GERD (gastroesophageal reflux disease)   . History of stroke June 2013 03/14/2012  . HOH (hard of hearing)   . Hypertension   . Hypothyroidism   . Nonhealing skin ulcer ([place]) 10/28/2014  . Obstructive sleep apnea   . Orthostatic hypotension   . Osteomyelitis ([place])    left great toe  . Pain in the chest 05/17/2016  . Peripheral arterial disease ([place]), s/p PTA x 2 RLE    nonhealing ulcers bilaterally on each great toe  . Pneumonia   . PONV (postoperative nausea and vomiting)   . Pulmonary nodule 05/17/2016  . Restless leg syndrome   . Shortness of breath   . Stroke [facility]) March 08, 2012  . Toe osteomyelitis, left ([place])   . Ulcer of great toe, left, with necrosis of bone ([place])   . Wears dentures     Past Surgical History:  Procedure Laterality Date  . AMPUTATION TOE Left 07/25/2018   Procedure: AMPUTATION TOE  INTERPHALANGEAL HALLUX LEFT;  Surgeon: Evelina Bucy, DPM;  Location: [place];  Service: Podiatry;  Laterality: Left;  . ANGIOPLASTY  02/28/14   diamond back orbital rotational atherectomy of Rt. tibial  . BACK SURGERY    . BONE BIOPSY Left 07/25/2018   Procedure: SUPERFICIAL BONE BIOPSY;  Surgeon: Evelina Bucy, DPM;  Location: [place];  Service: Podiatry;  Laterality: Left;  . CARDIAC CATHETERIZATION N/A 05/19/2016   Procedure: Right/Left Heart Cath and Coronary Angiography;  Surgeon: Belva Crome, MD;  Location: [place] CV LAB;  Service: Cardiovascular;  Laterality: N/A;  . CERVICAL FUSION    . ENDARTERECTOMY Left 11/29/2013   Procedure: ENDARTERECTOMY CAROTID;  Surgeon: Serafina Mitchell, MD;  Location: [facility] OR;  Service: Vascular;  Laterality: Left;  . ESOPHAGOGASTRODUODENOSCOPY  02/2010   Dr. Gala Romney: patient presented with food impaction, schatzki ring with superimposed component of stricture with erosive reflux esophagitis, s/p disimpaction but dilation planned at later date   . ESOPHAGOGASTRODUODENOSCOPY (EGD) WITH PROPOFOL N/A 08/13/2019   Dr. Gala Romney: Erosive reflux esophagitis with mild stricture and incidental Mallory-Weiss tear which precluded esophageal dilation.  Medium sized hiatal hernia.  [name] ESOPHAGOGASTRODUODENOSCOPY (EGD) WITH PROPOFOL N/A 10/11/2019   Dr. Gala Romney: Esophageal stenosis status post dilation, moderate hiatal hernia  . EYE SURGERY    . FLEXIBLE SIGMOIDOSCOPY N/A 08/13/2019   Procedure: FLEXIBLE SIGMOIDOSCOPY;  Surgeon: [name], MD;  [facility];  Service: Endoscopy;  Laterality: N/A;  colonoscopy aboerted due to formed stool and poor prep  . FOOT SURGERY    . KNEE ARTHROSCOPY WITH MEDIAL MENISECTOMY Left 05/06/2020   Procedure: KNEE ARTHROSCOPY WITH MEDIAL MENISCECTOMY AND LATERAL MENISCECTOMY;  Surgeon: [name], MD;  Location: [facility];  Service: Orthopedics;  Laterality: Left;  . Lower ext duplex doppler  03/14/14   Rt ABI 1.2  . LOWER  EXTREMITY ANGIOGRAM Bilateral 02/18/2014   Procedure: LOWER EXTREMITY ANGIOGRAM;  Surgeon: [name], MD;  [facility] CATH LAB;  Service: Cardiovascular;  Laterality: Bilateral;  . LOWER EXTREMITY ANGIOGRAM N/A 10/31/2014   Procedure: LOWER EXTREMITY ANGIOGRAM;  Surgeon: [name], MD;  Location[facility] CATH LAB;  Service: Cardiovascular;  Laterality: N/A;  . MALONEY DILATION N/A 10/11/2019   Procedure: Venia Minks DILATION;  Surgeon:  Rourk, [name], MD;  [facility];  Service: Endoscopy;  Laterality: N/A;  . MULTIPLE TOOTH EXTRACTIONS    . PV angiogram  02/18/2014   tibial vessel diseas bil.  [name] SPINE SURGERY    . tendon achillies lengthing and sesamoid      Social History   Socioeconomic History  . Marital status: Married    Spouse name: [name]   . Number of children: 4  . Years of education: college  . Highest education level: Not on file  Occupational History  . Occupation: Firefighter   Tobacco Use  . Smoking status: Former Smoker    Years: 1.00    Types: Pipe    Quit date: 07/08/1977    Years since quitting: 43.5  . Smokeless tobacco: Never Used  Vaping Use  . Vaping Use: Never used  Substance and Sexual Activity  . Alcohol use: No    Alcohol/week: 0.0 standard drinks  . Drug use: No  . Sexual activity: Not on file  Other Topics Concern  . Not on file  Social History Narrative   Retired from medical -[employer]      Lives with [name]    Cat: Jazz      Enjoy: sleeping a lot       Diet: eats all food groups -chicken, steak-chopped, mostly veggie   Caffeine: coffee and soda "a lot"   Water: 3-4 cups daily      Wears seat belt   Does not use phone while driving    Oceanographer at home    weapons at home        Social Determinants of Health   Financial Resource Strain: [place]   . Difficulty of Paying Living Expenses: Not hard at all  Food Insecurity: No Food Insecurity  . Worried About Charity fundraiser in the Last Year:  Never true  . Ran Out of Food in the Last Year: Never true  Transportation Needs: No Transportation Needs  . Lack of Transportation (Medical): No  . Lack of Transportation (Non-Medical): No  Physical Activity: Inactive  . Days of Exercise per Week: 0 days  . Minutes of Exercise per Session: 0 min  Stress: No Stress Concern Present  . Feeling of Stress : Only a little  Social Connections: Moderately Isolated  . Frequency of Communication with Friends and Family: Once a week  . Frequency of Social Gatherings with Friends and Family: Once a week  . Attends Religious Services: More than 4 times per year  . Active Member of Clubs or Organizations: No  . Attends Archivist Meetings: Never  . Marital Status: Married  Human resources officer Violence: Not At Risk  . Fear of Current or Ex-Partner: No  . Emotionally Abused: No  . Physically Abused: No  . Sexually Abused: No     Family History  Problem Relation Age of Onset  . Heart disease Mother   . Hypertension Mother   . Heart attack Mother   . Hypertension Father   . Diabetes Father   . Diabetes Son   . Heart disease Son   . Hypertension Son   . Colon cancer Neg Hx     Current Outpatient Medications  Medication Sig Dispense Refill  . aspirin EC 81 MG EC tablet Take 1 tablet (81 mg total) by mouth daily. (Patient taking differently: Take 81 mg by mouth at bedtime.)    . b complex vitamins tablet Take 1 tablet by  mouth daily.    . carvedilol (COREG) 3.125 MG tablet Take 1 tablet (3.125 mg total) by mouth 2 (two) times daily with a meal. 180 tablet 3  . Cholecalciferol (VITAMIN D3) 5000 units TABS Take 5,000 Units by mouth 2 (two) times daily.     . clopidogrel (PLAVIX) 75 MG tablet TAKE ONE (1) TABLET BY MOUTH EVERY DAY 90 tablet 0  . Continuous Blood Gluc Receiver (FREESTYLE LIBRE 14 DAY READER) DEVI     . Continuous Blood Gluc Sensor (FREESTYLE LIBRE 14 DAY SENSOR) MISC     . Cyanocobalamin 1500 MCG TBDP Take 1,500 mcg by  mouth daily.     . DULoxetine (CYMBALTA) 60 MG capsule Take 1 capsule (60 mg total) by mouth daily. 90 capsule 1  . empagliflozin (JARDIANCE) 25 MG TABS tablet Take 1 tablet (25 mg total) by mouth daily before breakfast. 90 tablet 1  . furosemide (LASIX) 40 MG tablet TAKE ONE TABLET (40MG  TOTAL) BY MOUTH TWO TIMES DAILY. 180 tablet 3  . glimepiride (AMARYL) 4 MG tablet Take 1 tablet (4 mg total) by mouth daily with breakfast. 90 tablet 1  . HYDROcodone-acetaminophen (NORCO/VICODIN) 5-325 MG tablet Take 1-2 tablets by mouth every 6 (six) hours as needed. 8 tablet 0  . levothyroxine (SYNTHROID) 75 MCG tablet Take 1 tablet (75 mcg total) by mouth daily before breakfast. 90 tablet 1  . lidocaine (LIDODERM) 5 % Place 1 patch onto the skin daily. Remove & Discard patch within 12 hours or as directed by MD 5 patch 0  . linaclotide (LINZESS) 290 MCG CAPS capsule Take 290 mcg by mouth daily before breakfast.    . Multiple Vitamin (MULTIVITAMIN WITH MINERALS) TABS tablet Take 1 tablet by mouth daily.    . nitroGLYCERIN (NITROSTAT) 0.4 MG SL tablet Place 1 tablet (0.4 mg total) under the tongue every 5 (five) minutes as needed for chest pain. 25 tablet 3  . pantoprazole (PROTONIX) 40 MG tablet TAKE ONE (1) TABLET BY MOUTH EVERY DAY 30 tablet 11  . pramipexole (MIRAPEX) 0.25 MG tablet Take 2 tablets (0.5 mg total) by mouth 2 (two) times daily. Take 0.5mg  by mouth at 7 PM and 0.5mg  at 9 PM daily. 360 tablet 3  . rosuvastatin (CRESTOR) 20 MG tablet Take 1 tablet (20 mg total) by mouth daily. 90 tablet 1  . UNABLE TO FIND Home health supplies for CPAP 1 Product 0  . potassium chloride (KLOR-CON) 10 MEQ tablet Take 1 tablet (10 mEq total) by mouth daily. Please schedule appointment with Dr. Gwenlyn Found 90 tablet 0   No current facility-administered medications for this visit.    Allergies  Allergen Reactions  . Codeine Nausea And Vomiting  . Tramadol Nausea Only    REVIEW OF SYSTEMS:   [X]  denotes positive  finding, [ ]  denotes negative finding Cardiac  Comments:  Chest pain or chest pressure:    Shortness of breath upon exertion:    Short of breath when lying flat:    Irregular heart rhythm:        Vascular    Pain in calf, thigh, or hip brought on by ambulation:    Pain in feet at night that wakes you up from your sleep:     Blood clot in your veins:    Leg swelling:  x       Pulmonary    Oxygen at home:    Productive cough:     Wheezing:  Neurologic    Sudden weakness in arms or legs:     Sudden numbness in arms or legs:     Sudden onset of difficulty speaking or slurred speech:    Temporary loss of vision in one eye:     Problems with dizziness:         Gastrointestinal    Blood in stool:     Vomited blood:         Genitourinary    Burning when urinating:     Blood in urine:        Psychiatric    Major depression:         Hematologic    Bleeding problems:    Problems with blood clotting too easily:        Skin    Rashes or ulcers:        Constitutional    Fever or chills:      PHYSICAL EXAMINATION:  Today's Vitals   01/07/21 1142  BP: (!) 91/54  Pulse: 88  Resp: 20  Temp: 98.1 F (36.7 C)  TempSrc: Temporal  SpO2: 97%  Weight: 195 lb 9.6 oz (88.7 kg)  Height: 6' (1.829 m)  PainSc: 8   PainLoc: Leg   Body mass index is 26.53 kg/m.   General:  Frail appearing and in NAD; pt did doze off a time or two during visit.  vital signs documented above Gait: Not observed HENT: WNL, normocephalic Pulmonary: normal non-labored breathing without wheezing Cardiac: regular HR; without carotid bruits Abdomen: soft, NT, no masses; aortic pulse is not palpable Skin: without rashes Vascular Exam/Pulses:  Right Left  Radial 2+ (normal) 2+ (normal)  DP 2+ (normal) 2+ (normal)  PT Unable to palpate Unable to palpate   Extremities: without ischemic changes, without cellulitis; with plantar surface wound right foot.  Musculoskeletal: no muscle wasting  or atrophy  Neurologic: A&O X 3;  moving all extremities equally Psychiatric:  The pt has Normal affect.   Non-Invasive Vascular Imaging:   Venous duplex on 01/07/2021: Venous Reflux Times  +--------------+---------+------+-----------+------------+--------+  RIGHT     Reflux NoRefluxReflux TimeDiameter cmsComments               Yes                   +--------------+---------+------+-----------+------------+--------+  CFV      no                         +--------------+---------+------+-----------+------------+--------+  FV mid          yes                   +--------------+---------+------+-----------+------------+--------+  Popliteal         yes                   +--------------+---------+------+-----------+------------+--------+  GSV at [facility]        yes  >500 ms   0.8         +--------------+---------+------+-----------+------------+--------+  GSV prox thigh      yes  >500 ms   0.66        +--------------+---------+------+-----------+------------+--------+  GSV mid thigh       yes  >500 ms   0.39        +--------------+---------+------+-----------+------------+--------+  GSV dist thighno               0.29        +--------------+---------+------+-----------+------------+--------+  GSV at  knee        yes  >500 ms   0.29        +--------------+---------+------+-----------+------------+--------+  GSV prox calf       yes  >500 ms   0.26        +--------------+---------+------+-----------+------------+--------+  SSV Pop Fossa no               0.18        +--------------+---------+------+-----------+------------+--------+  SSV prox calf no               0.17         +--------------+---------+------+-----------+------------+--------+  SSV mid calf no               0.22        +--------------+---------+------+-----------+------------+--------+     +--------------+---------+------+-----------+------------+--------+  LEFT     Reflux NoRefluxReflux TimeDiameter cmsComments               Yes                   +--------------+---------+------+-----------+------------+--------+  CFV      no                         +--------------+---------+------+-----------+------------+--------+  FV mid    no                         +--------------+---------+------+-----------+------------+--------+  Popliteal   no                         +--------------+---------+------+-----------+------------+--------+  GSV at SFJ        yes  >500 ms   0.88        +--------------+---------+------+-----------+------------+--------+  GSV prox thigh      yes  >500 ms   0.39        +--------------+---------+------+-----------+------------+--------+  GSV mid thigh       yes  >500 ms   0.38        +--------------+---------+------+-----------+------------+--------+  GSV dist thigh      yes  >500 ms   0.23        +--------------+---------+------+-----------+------------+--------+  GSV at knee  no               0.24        +--------------+---------+------+-----------+------------+--------+  GSV prox calf no               0.16        +--------------+---------+------+-----------+------------+--------+  SSV Pop Fossa no               0.14        +--------------+---------+------+-----------+------------+--------+  SSV prox calf no                0.2         +--------------+---------+------+-----------+------------+--------+  SSV mid calf no               0.18        +--------------+---------+------+-----------+------------+--------+   Summary:  Right:  - No evidence of deep vein thrombosis seen in the right lower extremity,  from the common femoral through the popliteal veins.  - No evidence of superficial venous thrombosis in the right lower  extremity.  - Venous reflux is noted in the right sapheno-femoral junction.  - Venous reflux is noted in the right greater saphenous vein in the thigh.  - Venous reflux is noted in the right greater saphenous vein in  the calf.  - Venous reflux is noted in the right femoral vein.  - Venous reflux is noted in the right popliteal vein.    Left:  - No evidence of deep vein thrombosis seen in the left lower extremity,  from the common femoral through the popliteal veins.  - No evidence of superficial venous thrombosis in the left lower  extremity.  - Venous reflux is noted in the left sapheno-femoral junction.  - Venous reflux is noted in the left greater saphenous vein in the thigh.   LEARY MCNULTY is a 75 y.o. male who presents with: BLE leg swelling, cramping and restless leg syndrome with hx of CHF, PAD, DM  U/s today reveals no evidence of DVT bilaterally.  He does have venous reflux in bilateral superficial and deep systems.  Diameter of vein is not amendable to laser ablation. -discussed with pt about wearing knee high 15-65mmHg compression stockings to help with swelling.  Would not go to moderate compression given his hx of PAD.  -discussed the importance of leg elevation and how to elevate properly - pt is advised to elevate their legs and a diagram is given to them to demonstrate to lay flat on their back with knees elevated and slightly bent with their feet higher than her knees, which puts their feet higher than their heart for 15 minutes  per day.  If they cannot lay flat, advised to lay as flat as possible.  -pt is advised to continue as much walking as possible and avoid sitting or standing for long periods of time.  -discussed importance of weight loss and exercise and that water aerobics would also be beneficial.  -handout with recommendations given -pt will f/u as needed.  -Dr. Gwenlyn Found follows pt carotid and PAD surveillance and will defer to Dr. Gwenlyn Found if pt needs arteriogram for non healing wound since he follows him for this.  Pt does have palpable DP pulses bilaterally. -pt with 60lb unintentional weight loss-says PCP is aware and working this up.   Pt states it is time to schedule his colonoscopy.   Leontine Locket, [facility] 01/07/2021 11:44 AM  Clinic MD:  Scot Dock

## 2021-01-08 ENCOUNTER — Other Ambulatory Visit: Payer: Self-pay

## 2021-01-08 ENCOUNTER — Encounter (HOSPITAL_COMMUNITY): Payer: Self-pay

## 2021-01-08 ENCOUNTER — Ambulatory Visit (HOSPITAL_COMMUNITY): Payer: HMO

## 2021-01-08 DIAGNOSIS — G8929 Other chronic pain: Secondary | ICD-10-CM

## 2021-01-08 DIAGNOSIS — E08621 Diabetes mellitus due to underlying condition with foot ulcer: Secondary | ICD-10-CM

## 2021-01-08 DIAGNOSIS — R262 Difficulty in walking, not elsewhere classified: Secondary | ICD-10-CM | POA: Diagnosis not present

## 2021-01-08 DIAGNOSIS — L97411 Non-pressure chronic ulcer of right heel and midfoot limited to breakdown of skin: Secondary | ICD-10-CM

## 2021-01-08 DIAGNOSIS — M25562 Pain in left knee: Secondary | ICD-10-CM

## 2021-01-08 NOTE — Therapy (Addendum)
[place], [place], [address] Phone: [phone]   Fax:  [phone]  Physical Therapy Treatment  Patient Details  Name: [name] MRN: [id number] Date of Birth: 1946/09/06 Referring Provider (PT): Perlie Mayo   Encounter Date: 01/08/2021   PT End of Session - 01/08/21 1306    Visit Number 21    Number of Visits 26    Date for PT Re-Evaluation 01/15/21    Authorization Type [employer], no VL, no auth , $15 co pay    Progress Note Due on Visit 25    PT Start Time 1048    PT Stop Time 1212    PT Time Calculation (min) 84 min    Equipment Utilized During Treatment Gait belt    Activity Tolerance Patient tolerated treatment well;Patient limited by fatigue    Behavior During Therapy [facility] for tasks assessed/performed           Past Medical History:  Diagnosis Date  . Aftercare following surgery of the circulatory system, [place] 12/04/2013  . Angina decubitus ([place]) 05/19/2016  . Arrhythmia 05/20/2016  . Arthritis   . ARTHRITIS, RIGHT FOOT 06/26/2008   Qualifier: Diagnosis of  By: Aline Brochure MD, Dorothyann Peng    . Cardiomyopathy- EF NL 01/2013, now 30-35% echo 03/14/2012  . Carotid artery occlusion    left s/p CEA  . Cellulitis of left foot 05/07/2018  . CHF (congestive heart failure) ([place])   . Critical lower limb ischemia (HCC) 02/28/2014   Critical limb ischemia   . Diabetes mellitus   . Elevated troponin 04/11/2016  . GERD (gastroesophageal reflux disease)   . History of stroke June 2013 03/14/2012  . HOH (hard of hearing)   . Hypertension   . Hypothyroidism   . Nonhealing skin ulcer ([place]) 10/28/2014  . Obstructive sleep apnea   . Orthostatic hypotension   . Osteomyelitis ([place])    left great toe  . Pain in the chest 05/17/2016  . Peripheral arterial disease ([place]), s/p PTA x 2 RLE    nonhealing ulcers bilaterally on each great toe  . Pneumonia   . PONV (postoperative nausea and vomiting)   . Pulmonary  nodule 05/17/2016  . Restless leg syndrome   . Shortness of breath   . Stroke [facility]) March 08, 2012  . Toe osteomyelitis, left ([place])   . Ulcer of great toe, left, with necrosis of bone ([place])   . Wears dentures     Past Surgical History:  Procedure Laterality Date  . AMPUTATION TOE Left 07/25/2018   Procedure: AMPUTATION TOE INTERPHALANGEAL HALLUX LEFT;  Surgeon: Evelina Bucy, DPM;  Location: [place];  Service: Podiatry;  Laterality: Left;  . ANGIOPLASTY  02/28/14   diamond back orbital rotational atherectomy of Rt. tibial  . BACK SURGERY    . BONE BIOPSY Left 07/25/2018   Procedure: SUPERFICIAL BONE BIOPSY;  Surgeon: Evelina Bucy, DPM;  Location: [place];  Service: Podiatry;  Laterality: Left;  . CARDIAC CATHETERIZATION N/A 05/19/2016   Procedure: Right/Left Heart Cath and Coronary Angiography;  Surgeon: Belva Crome, MD;  Location: [place] CV LAB;  Service: Cardiovascular;  Laterality: N/A;  . CERVICAL FUSION    . ENDARTERECTOMY Left 11/29/2013   Procedure: ENDARTERECTOMY CAROTID;  Surgeon: Serafina Mitchell, MD;  Location: [place];  Service: Vascular;  Laterality: Left;  . ESOPHAGOGASTRODUODENOSCOPY  02/2010   Dr. Gala Romney: patient presented with food impaction, schatzki ring with superimposed component of stricture  with erosive reflux esophagitis, s/p disimpaction but dilation planned at later date   . ESOPHAGOGASTRODUODENOSCOPY (EGD) WITH PROPOFOL N/A 08/13/2019   Dr. Gala Romney: Erosive reflux esophagitis with mild stricture and incidental Mallory-Weiss tear which precluded esophageal dilation.  Medium sized hiatal hernia.  [name] ESOPHAGOGASTRODUODENOSCOPY (EGD) WITH PROPOFOL N/A 10/11/2019   Dr. Gala Romney: Esophageal stenosis status post dilation, moderate hiatal hernia  . EYE SURGERY    . FLEXIBLE SIGMOIDOSCOPY N/A 08/13/2019   Procedure: FLEXIBLE SIGMOIDOSCOPY;  Surgeon: [name], MD;  [facility];  Service: Endoscopy;  Laterality: N/A;  colonoscopy aboerted due to formed stool  and poor prep  . FOOT SURGERY    . KNEE ARTHROSCOPY WITH MEDIAL MENISECTOMY Left 05/06/2020   Procedure: KNEE ARTHROSCOPY WITH MEDIAL MENISCECTOMY AND LATERAL MENISCECTOMY;  Surgeon: [name], MD;  Location: [facility];  Service: Orthopedics;  Laterality: Left;  . Lower ext duplex doppler  03/14/14   Rt ABI 1.2  . LOWER EXTREMITY ANGIOGRAM Bilateral 02/18/2014   Procedure: LOWER EXTREMITY ANGIOGRAM;  Surgeon: [name], MD;  Location: [facility] CATH LAB;  Service: Cardiovascular;  Laterality: Bilateral;  . LOWER EXTREMITY ANGIOGRAM N/A 10/31/2014   Procedure: LOWER EXTREMITY ANGIOGRAM;  Surgeon: [name], MD;  Location: [facility] CATH LAB;  Service: Cardiovascular;  Laterality: N/A;  . Venia Minks DILATION N/A 10/11/2019   Procedure: Venia Minks DILATION;  Surgeon: [name], MD;  [facility];  Service: Endoscopy;  Laterality: N/A;  . MULTIPLE TOOTH EXTRACTIONS    . PV angiogram  02/18/2014   tibial vessel diseas bil.  [name] SPINE SURGERY    . tendon achillies lengthing and sesamoid      There were no vitals filed for this visit.   Subjective Assessment - 01/08/21 1148    Subjective MD apt yesterday, dopler test reports good circulation and was given compression garment though unable to put on. Arrived wearing pressure relief shoes.    Pertinent History CHF , R foot wound, left knee scope, DB, Restless leg syndrome, hx of stroke.    Patient Stated Goals be able to walk normally and function correctly.    Currently in Pain? No/denies                           Wound Therapy - 01/08/21 0001    Subjective MD apt yesterday, dopler test reports good circulation and was given compression garment though unable to put on. Arrived wearing pressure relief shoes.    Patient and Family Stated Goals to have wound to heal and to be able to walk    Date of Onset --   4-5 years ago   Prior Treatments wound clinics, neosporin on bandaid but bandaid wound stick.    Pain Scale  0-10    Pain Score 0-No pain    Evaluation and Treatment Procedures Explained to Patient/Family Yes    Evaluation and Treatment Procedures agreed to    Wound Properties Date First Assessed: 10/20/20 Time First Assessed: 1000 Wound Type: Diabetic ulcer Location: Foot Location Orientation: Anterior;Right Wound Description (Comments): bottom of foot Present on Admission: Yes   Dressing Type Impregnated gauze (bismuth)   medihoney, xeroform, 2x2 medipore tape   Dressing Changed Changed    Dressing Status Old drainage    Dressing Change Frequency PRN    Site / Wound Assessment Clean    % Wound base Red or Granulating 100%    Wound Length (cm) 0.7 cm  Wound Width (cm) 0.7 cm    Wound Depth (cm) 0.1 cm    Wound Volume (cm^3) 0.05 cm^3    Wound Surface Area (cm^2) 0.49 cm^2    Drainage Amount Minimal    Drainage Description Serosanguineous    Treatment Cleansed;Debridement (Selective)    Selective Debridement - Location edges and calloused perimeter; expect wound to get larger with debridement    Selective Debridement - Tools Used Forceps;Scalpel    Selective Debridement - Tissue Removed callous devitalized tissue.    Wound Therapy - Clinical Statement Wound progressing well with approximation.  Pt wiht increased restless legs which made selective debridment difficulty.  Pt educated on butler to assist with donning new compression garments.    Wound Therapy - Functional Problem List difficulty walking, washing, dressing    Factors Delaying/Impairing Wound Healing Diabetes Mellitus;Immobility    Wound Therapy - Frequency 2X / week    Wound Therapy - Current Recommendations PT    Wound Plan Reassess and re-cert next session.  Continue wound care with appropriate dressings.    Dressing  medihoney wound bed, xeroform on top, vaseline perimeter, 2x2 and medipore tape.            [facility] Adult PT Treatment/Exercise - 01/08/21 0001      Knee/Hip Exercises: Standing   Hip Abduction  AROM;Stengthening;20 reps;Knee straight    Abduction Limitations 2#    Hip Extension Stengthening;Both;20 reps;Knee straight    Extension Limitations 2#, 5" holds    Functional Squat 15 reps    Functional Squat Limitations front of mat, 2# Bil UE no HHA    SLS with Vectors 3x5" holds each way with 1 HHA    Other Standing Knee Exercises sidestep front of mat 5RT                    PT Short Term Goals - 01/08/21 1317      PT SHORT TERM GOAL #1   Title Patient will be independent in self management strategies to improve quality of life and functional outcomes.    Baseline 4/21: Reports compliance with HEP daily    Status On-going      PT SHORT TERM GOAL #2   Title Patient will be able to report at least 25% improvement in overall mobility.    Baseline 11/19/20:  Reports at least 25% improvement    Status Achieved      PT SHORT TERM GOAL #3   Title Patient's wound will be reduced in volume by at least 25% to demonstrate improved wound healing    Status Achieved             PT Long Term Goals - 01/08/21 1317      PT LONG TERM GOAL #1   Title Patient will be able to ambulate at least 100 feet with RW in 2 minutes to demonstrate improved ambulatory endurance    Baseline 11/19/20:  2MWT 159ft with rollator, improved foot clearance followng cueing from therapist    Status Achieved      PT LONG TERM GOAL #2   Title Patient's wound will reduce in overall volume byt at least 50% to demonstrate improved wound healing    Status On-going      PT LONG TERM GOAL #3   Title Patient will be able to demonstate 5-110 degrees in left knee to demonstrate improved knee mobility    Baseline 11/19/20: Lt 18-123 degrees, Rt 5-120 degrees    Status On-going  Plan - 01/08/21 1307    Clinical Impression Statement Following wound care educated pt with butler to assist with donning new compression garments.  Session focus with hip strengthening and balance training.   Cueing for posture and proper mechanics with exercises.  Pt wiht increased restless leg symptoms today Rt LE with standing exercises required increased support for fall prevention, pt was escorted out to vehicle for safety at [facility].  Encouraged pt to discuss appropriate medication to assist with restless legs as reports he did not take last night.    Personal Factors and Comorbidities Comorbidity 1;Comorbidity 3+;Comorbidity 2    Comorbidities CHF, left knee surgery, right chronic foot wound, DB, restless leg syndromes    Examination-Activity Limitations Bathing;Lift;Locomotion Level;Transfers;Stand;Stairs;Squat    Examination-Participation Restrictions Community Activity;Meal Prep    Stability/Clinical Decision Making Evolving/Moderate complexity    Clinical Decision Making Moderate    Rehab Potential Fair    PT Frequency 2x / week    PT Duration --   4 more weeks ending 01/08/21   PT Treatment/Interventions ADLs/Self Care Home Management;Other (comment);Moist Heat;Balance training;Therapeutic exercise;Therapeutic activities;Functional mobility training;Stair training;Gait training;DME Instruction;Ultrasound;Neuromuscular re-education;Patient/family education;Orthotic Fit/Training;Manual techniques;Taping;Passive range of motion;Electrical Stimulation    PT Next Visit Plan Review goals for cert next week.  focus primarily on  standing exercises and balance training continue wound care.  F/U on increased frequency of HEP compliance; work on improving glute strength.    PT Home Exercise Plan calf stretch with towel, bridges.  2/8: bridge, SLR, hip abduction, hip extension   2/10:  sit to stands; 2/17:  LAQ with wt, supine hip abduction, sidelying hip abduction, 2/21 standing hip abd; 3/2:SAQ; 3/8: long sitting hamstring strech, hip abduction; 4/12: quadruped UE, LE, firehydrant  4/19: vectors, standing hip abd/ext/march    Consulted and Agree with Plan of Care Patient           Patient will benefit  from skilled therapeutic intervention in order to improve the following deficits and impairments:  Abnormal gait,Decreased endurance,Decreased skin integrity,Increased edema,Decreased activity tolerance,Decreased balance,Decreased mobility,Decreased knowledge of use of DME,Decreased strength,Difficulty walking,Pain,Decreased range of motion,Impaired flexibility  Visit Diagnosis: Chronic pain of left knee  Difficulty in walking, not elsewhere classified  Diabetic ulcer of right midfoot associated with diabetes mellitus due to underlying condition, limited to breakdown of skin [facility])     Problem List Patient Active Problem List   Diagnosis Date Noted  . Gait abnormality 09/30/2020  . Mild non proliferative diabetic retinopathy ([place]) 07/16/2020  . Need for immunization against influenza 06/03/2020  . S/P left knee arthroscopy 05/06/20 05/13/2020  . Abnormal MRI 03/26/2020  . Joint disorder of knee 03/26/2020  . Fall at home, initial encounter 03/13/2020  . Anemia 09/03/2019  . Reflux esophagitis 09/03/2019  . Esophageal dysphagia 05/30/2019  . Constipation 05/30/2019  . Positive colorectal cancer screening using Cologuard test 05/30/2019  . Diabetic foot ulcer ([place]) 09/06/2018  . Congestive heart failure ([place]) 05/17/2016  . Bilateral carotid artery disease ([place]) 07/08/2014  . Obstructive sleep apnea 02/12/2014  . Restless leg syndrome 10/23/2013  . Overweight with body mass index (BMI) of 29 to 29.9 in adult 10/23/2013  . Hyperlipidemia 07/16/2013  . Peripheral arterial disease ([place]) 07/16/2013  . Hypertension 03/14/2012  . Type 2 diabetes mellitus with circulatory disorder ([place]) 03/14/2012  . Peripheral neuropathy 03/14/2012  . Cardiomyopathy- EF NL 01/2013, now 30-35% echo 03/14/2012   [name], LPTA/CLT; CBIS [phone]  [name] 01/08/2021, 4:46 PM  [facility]  [place], [place], [address] Phone:  [phone]   Fax:  [phone]  Name: [name] MRN: [id number] Date of Birth: April 25, 1946

## 2021-01-13 ENCOUNTER — Encounter (HOSPITAL_COMMUNITY): Payer: Self-pay | Admitting: Physical Therapy

## 2021-01-13 ENCOUNTER — Ambulatory Visit (HOSPITAL_COMMUNITY): Payer: HMO | Admitting: Physical Therapy

## 2021-01-13 ENCOUNTER — Telehealth: Payer: Self-pay

## 2021-01-13 ENCOUNTER — Other Ambulatory Visit: Payer: Self-pay

## 2021-01-13 DIAGNOSIS — M25562 Pain in left knee: Secondary | ICD-10-CM

## 2021-01-13 DIAGNOSIS — R262 Difficulty in walking, not elsewhere classified: Secondary | ICD-10-CM

## 2021-01-13 DIAGNOSIS — G8929 Other chronic pain: Secondary | ICD-10-CM

## 2021-01-13 DIAGNOSIS — L97411 Non-pressure chronic ulcer of right heel and midfoot limited to breakdown of skin: Secondary | ICD-10-CM

## 2021-01-13 DIAGNOSIS — E08621 Diabetes mellitus due to underlying condition with foot ulcer: Secondary | ICD-10-CM

## 2021-01-13 NOTE — Therapy (Signed)
[place], [place], [address] Phone: [phone]   Fax:  [phone]  Wound Care Therapy, Progress Note and RECERT  Patient Details  Name: [name] MRN: [id number] Date of Birth: [birth year] Referring Provider (PT): Perlie Mayo   Encounter Date: 01/13/2021   Progress Note Reporting Period 12/11/20 to 01/13/21  See note below for Objective Data and Assessment of Progress/Goals.       PT End of Session - 01/13/21 1120    Visit Number 22    Number of Visits 30    Date for PT Re-Evaluation 02/10/21    Authorization Type [employer], no VL, no auth , $15 co pay    Progress Note Due on Visit 32    PT Start Time 1048    PT Stop Time 1155    PT Time Calculation (min) 67 min    Equipment Utilized During Treatment Gait belt    Activity Tolerance Patient tolerated treatment well;Patient limited by fatigue    Behavior During Therapy WFL for tasks assessed/performed           Past Medical History:  Diagnosis Date  . Aftercare following surgery of the circulatory system, [place] 12/04/2013  . Angina decubitus ([place]) 05/19/2016  . Arrhythmia 05/20/2016  . Arthritis   . ARTHRITIS, RIGHT FOOT 06/26/2008   Qualifier: Diagnosis of  By: Aline Brochure MD, Dorothyann Peng    . Cardiomyopathy- EF NL 01/2013, now 30-35% echo 03/14/2012  . Carotid artery occlusion    left s/p CEA  . Cellulitis of left foot 05/07/2018  . CHF (congestive heart failure) ([place])   . Critical lower limb ischemia (HCC) 02/28/2014   Critical limb ischemia   . Diabetes mellitus   . Elevated troponin 04/11/2016  . GERD (gastroesophageal reflux disease)   . History of stroke June 2013 03/14/2012  . HOH (hard of hearing)   . Hypertension   . Hypothyroidism   . Nonhealing skin ulcer ([place]) 10/28/2014  . Obstructive sleep apnea   . Orthostatic hypotension   . Osteomyelitis ([place])    left great toe  . Pain in the chest 05/17/2016  . Peripheral arterial  disease ([place]), s/p PTA x 2 RLE    nonhealing ulcers bilaterally on each great toe  . Pneumonia   . PONV (postoperative nausea and vomiting)   . Pulmonary nodule 05/17/2016  . Restless leg syndrome   . Shortness of breath   . Stroke [facility]) March 08, 2012  . Toe osteomyelitis, left ([place])   . Ulcer of great toe, left, with necrosis of bone ([place])   . Wears dentures     Past Surgical History:  Procedure Laterality Date  . AMPUTATION TOE Left 07/25/2018   Procedure: AMPUTATION TOE INTERPHALANGEAL HALLUX LEFT;  Surgeon: Evelina Bucy, DPM;  Location: [facility];  Service: Podiatry;  Laterality: Left;  . ANGIOPLASTY  02/28/14   diamond back orbital rotational atherectomy of Rt. tibial  . BACK SURGERY    . BONE BIOPSY Left 07/25/2018   Procedure: SUPERFICIAL BONE BIOPSY;  Surgeon: Evelina Bucy, DPM;  Location: [facility];  Service: Podiatry;  Laterality: Left;  . CARDIAC CATHETERIZATION N/A 05/19/2016   Procedure: Right/Left Heart Cath and Coronary Angiography;  Surgeon: Belva Crome, MD;  Location: [facility] LAB;  Service: Cardiovascular;  Laterality: N/A;  . CERVICAL FUSION    . ENDARTERECTOMY Left 11/29/2013   Procedure: ENDARTERECTOMY CAROTID;  Surgeon: Serafina Mitchell, MD;  Location[facility]  OR;  Service: Vascular;  Laterality: Left;  . ESOPHAGOGASTRODUODENOSCOPY  02/2010   Dr. Gala Romney: patient presented with food impaction, schatzki ring with superimposed component of stricture with erosive reflux esophagitis, s/p disimpaction but dilation planned at later date   . ESOPHAGOGASTRODUODENOSCOPY (EGD) WITH PROPOFOL N/A 08/13/2019   Dr. Gala Romney: Erosive reflux esophagitis with mild stricture and incidental Mallory-Weiss tear which precluded esophageal dilation.  Medium sized hiatal hernia.  [name] ESOPHAGOGASTRODUODENOSCOPY (EGD) WITH PROPOFOL N/A 10/11/2019   Dr. Gala Romney: Esophageal stenosis status post dilation, moderate hiatal hernia  . EYE SURGERY    . FLEXIBLE SIGMOIDOSCOPY N/A 08/13/2019   Procedure:  FLEXIBLE SIGMOIDOSCOPY;  Surgeon: [name], MD;  [facility];  Service: Endoscopy;  Laterality: N/A;  colonoscopy aboerted due to formed stool and poor prep  . FOOT SURGERY    . KNEE ARTHROSCOPY WITH MEDIAL MENISECTOMY Left 05/06/2020   Procedure: KNEE ARTHROSCOPY WITH MEDIAL MENISCECTOMY AND LATERAL MENISCECTOMY;  Surgeon: [name], MD;  Location: [facility];  Service: Orthopedics;  Laterality: Left;  . Lower ext duplex doppler  03/14/14   Rt ABI 1.2  . LOWER EXTREMITY ANGIOGRAM Bilateral 02/18/2014   Procedure: LOWER EXTREMITY ANGIOGRAM;  Surgeon: [name], MD;  Location: [facility] CATH LAB;  Service: Cardiovascular;  Laterality: Bilateral;  . LOWER EXTREMITY ANGIOGRAM N/A 10/31/2014   Procedure: LOWER EXTREMITY ANGIOGRAM;  Surgeon: [name], MD;  Location: [facility] CATH LAB;  Service: Cardiovascular;  Laterality: N/A;  . Venia Minks DILATION N/A 10/11/2019   Procedure: Venia Minks DILATION;  Surgeon: [name], MD;  [facility];  Service: Endoscopy;  Laterality: N/A;  . MULTIPLE TOOTH EXTRACTIONS    . PV angiogram  02/18/2014   tibial vessel diseas bil.  [name] SPINE SURGERY    . tendon achillies lengthing and sesamoid      There were no vitals filed for this visit.    Subjective Assessment - 01/13/21 0001    Subjective overall states that he feels his diabetic shoes are helping as his feet aren't slipping. States he does his exercises 1-2x/day and that he is walkign more adn feels like he is taking bigger strides    Currently in Pain? No/denies            [facility] PT Assessment - 01/13/21 0001      Assessment   Medical Diagnosis R foot ulcer and gait instability      AROM   Right Knee Extension 5   lacking   Right Knee Flexion 120    Left Knee Extension 15   lacking   Left Knee Flexion 120      Strength   Right Hip Flexion 5/5    Right Hip Extension 3/5    Right Hip ABduction 3/5    Left Hip Flexion 5/5    Left Hip Extension 3/5    Left Hip  ABduction 3-/5    Right Knee Flexion 4+/5    Right Knee Extension 5/5    Left Knee Flexion 4+/5    Left Knee Extension 4+/5      Transfers   Five time sit to stand comments  16.9 seconds   no UE assist                  Wound Therapy - 01/13/21 0001    Subjective STates that he has no pain and his only fall was about 1 month ago. States he has been doing his exercises 2x/day    Patient  and Family Stated Goals to have wound to heal and to be able to walk    Date of Onset --   4-5 years ago   Prior Treatments wound clinics, neosporin on bandaid but bandaid wound stick.    Pain Score 0-No pain    Evaluation and Treatment Procedures Explained to Patient/Family Yes    Evaluation and Treatment Procedures agreed to    Wound Properties Date First Assessed: 10/20/20 Time First Assessed: 1000 Wound Type: Diabetic ulcer Location: Foot Location Orientation: Anterior;Right Wound Description (Comments): bottom of foot Present on Admission: Yes   Dressing Type Impregnated gauze (bismuth)   medihoney on gauze   Dressing Changed Changed    Dressing Status Old drainage    Dressing Change Frequency PRN    Site / Wound Assessment Clean;Granulation tissue    % Wound base Yellow/Fibrinous Exudate 100%    Peri-wound Assessment --   callus   Wound Length (cm) 0.8 cm   was .7   Wound Width (cm) 0.8 cm   was .7   Wound Depth (cm) 0.2 cm   was .1   Wound Volume (cm^3) 0.13 cm^3    Wound Surface Area (cm^2) 0.64 cm^2    Drainage Amount Minimal    Drainage Description Serosanguineous    Treatment Cleansed;Debridement (Selective)    Selective Debridement - Location edges and calloused perimeter; expect wound to get larger with debridement    Selective Debridement - Tools Used Forceps;Scalpel    Selective Debridement - Tissue Removed callous devitalized tissue.    Wound Therapy - Clinical Statement Wound overall has progressed and is approximating. Continued with medihoney and xerofrom. Mild restless  leg syndrome episodes during session.    Wound Therapy - Functional Problem List difficulty walking, washing, dressing    Factors Delaying/Impairing Wound Healing Diabetes Mellitus;Immobility    Wound Therapy - Frequency 2X / week    Wound Therapy - Current Recommendations PT    Wound Plan Continue wound care with appropriate dressings.    Dressing  medihoney wound bed, xeroform on top, vaseline perimeter, 2x2 and medipore tape.                   PT Education - 01/13/21 1119    Education Details on how to use buttler with compression garments, on donning garment - practiced in session with assist as needed    Person(s) Educated Patient    Methods Explanation;Demonstration;Tactile cues;Verbal cues    Comprehension Verbalized understanding;Returned demonstration            PT Short Term Goals - 01/13/21 1123      PT SHORT TERM GOAL #1   Title Patient will be independent in self management strategies to improve quality of life and functional outcomes.    Baseline performs 1-2x/day    Status Achieved      PT SHORT TERM GOAL #2   Title Patient will be able to report at least 25% improvement in overall mobility.    Baseline feels 100% better    Status Achieved      PT SHORT TERM GOAL #3   Title Patient's wound will be reduced in volume by at least 25% to demonstrate improved wound healing    Baseline was 2.62m^3, now .13 m^3    Status Achieved             PT Long Term Goals - 01/13/21 1130      PT LONG TERM GOAL #1   Title Patient will be  able to ambulate at least 100 feet with RW in 2 minutes to demonstrate improved ambulatory endurance    Baseline 11/19/20:  2MWT 137ft with rollator, improved foot clearance followng cueing from therapist    Status Achieved      PT LONG TERM GOAL #2   Title Patient's wound will reduce in overall volume byt at least 50% to demonstrate improved wound healing    Baseline was 2.67m^3 and now .90 m ^3    Status Achieved      PT LONG  TERM GOAL #3   Title Patient will be able to demonstate 5-110 degrees in left knee to demonstrate improved knee mobility    Baseline 11/19/20: Lt 18-123 degrees, Rt 5-120 degrees    Status On-going      PT LONG TERM GOAL #4   Title Wound will be completely healed to reduce risk of infection    Time 4    Period Weeks    Status New    Target Date 02/10/21      PT LONG TERM GOAL #5   Title Patient will be able to perform 5x sit to stand without use of arms in < 14.5 seconds to demonstrate improved functional strength    Time 4    Period Weeks    Status New    Target Date 02/10/21                 Plan - 01/13/21 1203    Clinical Impression Statement Patient with strong desire to walk without cane. Educated patient on fall risk and importance of continued use of either quad cane or walker. Educated patient on current presentation, overall progress in wound and knee and plan moving forward. Patient would continue to benefit from skilled physical therapy reduce fall risk and improve overall wound healing.    Personal Factors and Comorbidities Comorbidity 1;Comorbidity 3+;Comorbidity 2    Comorbidities CHF, left knee surgery, right chronic foot wound, DB, restless leg syndromes    Examination-Activity Limitations Bathing;Lift;Locomotion Level;Transfers;Stand;Stairs;Squat    Examination-Participation Restrictions Community Activity;Meal Prep    Stability/Clinical Decision Making Evolving/Moderate complexity    Rehab Potential Fair    PT Frequency 2x / week    PT Duration 4 weeks    PT Treatment/Interventions ADLs/Self Care Home Management;Other (comment);Moist Heat;Balance training;Therapeutic exercise;Therapeutic activities;Functional mobility training;Stair training;Gait training;DME Instruction;Ultrasound;Neuromuscular re-education;Patient/family education;Orthotic Fit/Training;Manual techniques;Taping;Passive range of motion;Electrical Stimulation    PT Next Visit Plan review HEP and  consoladate for pt,  focus primarily on  standing exercises and balance training continue wound care.  F/U on increased frequency of HEP compliance; work on improving glute strength.    PT Home Exercise Plan calf stretch with towel, bridges.  2/8: bridge, SLR, hip abduction, hip extension   2/10:  sit to stands; 2/17:  LAQ with wt, supine hip abduction, sidelying hip abduction, 2/21 standing hip abd; 3/2:SAQ; 3/8: long sitting hamstring strech, hip abduction; 4/12: quadruped UE, LE, firehydrant  4/19: vectors, standing hip abd/ext/march    Consulted and Agree with Plan of Care Patient           Patient will benefit from skilled therapeutic intervention in order to improve the following deficits and impairments:  Abnormal gait,Decreased endurance,Decreased skin integrity,Increased edema,Decreased activity tolerance,Decreased balance,Decreased mobility,Decreased knowledge of use of DME,Decreased strength,Difficulty walking,Pain,Decreased range of motion,Impaired flexibility  Visit Diagnosis: Chronic pain of left knee  Diabetic ulcer of right midfoot associated with diabetes mellitus due to underlying condition, limited to breakdown of skin ([place])  Difficulty in walking, not elsewhere classified     Problem List Patient Active Problem List   Diagnosis Date Noted  . Gait abnormality 09/30/2020  . Mild non proliferative diabetic retinopathy ([place]) 07/16/2020  . Need for immunization against influenza 06/03/2020  . S/P left knee arthroscopy 05/06/20 05/13/2020  . Abnormal MRI 03/26/2020  . Joint disorder of knee 03/26/2020  . Fall at home, initial encounter 03/13/2020  . Anemia 09/03/2019  . Reflux esophagitis 09/03/2019  . Esophageal dysphagia 05/30/2019  . Constipation 05/30/2019  . Positive colorectal cancer screening using Cologuard test 05/30/2019  . Diabetic foot ulcer ([place]) 09/06/2018  . Congestive heart failure ([place]) 05/17/2016  . Bilateral carotid artery disease ([place]) 07/08/2014   . Obstructive sleep apnea 02/12/2014  . Restless leg syndrome 10/23/2013  . Overweight with body mass index (BMI) of 29 to 29.9 in adult 10/23/2013  . Hyperlipidemia 07/16/2013  . Peripheral arterial disease ([place]) 07/16/2013  . Hypertension 03/14/2012  . Type 2 diabetes mellitus with circulatory disorder ([place]) 03/14/2012  . Peripheral neuropathy 03/14/2012  . Cardiomyopathy- EF NL 01/2013, now 30-35% echo 03/14/2012    1:05 PM, 01/13/21 [name], DPT Physical Therapy with [facility]  [phone] office   [place] [address] [place], [place], [address] Phone: [phone]   Fax:  [phone]  Name: DARKO YECK MRN: [id number] Date of Birth: September 07, 1946

## 2021-01-13 NOTE — Telephone Encounter (Signed)
Patient walkin needs a RX for [place] to help put his support hose on, needs size xlarge. If provider can not do this, he said please let him know [facility] . Patient call back # [phone].  Pharmacy: [facility]

## 2021-01-14 ENCOUNTER — Other Ambulatory Visit: Payer: Self-pay

## 2021-01-14 DIAGNOSIS — G629 Polyneuropathy, unspecified: Secondary | ICD-10-CM

## 2021-01-14 DIAGNOSIS — G2581 Restless legs syndrome: Secondary | ICD-10-CM

## 2021-01-14 DIAGNOSIS — R601 Generalized edema: Secondary | ICD-10-CM

## 2021-01-14 MED ORDER — UNABLE TO FIND: 0 refills | Status: AC

## 2021-01-14 NOTE — Telephone Encounter (Signed)
Left message for pt that Rx has been faxed to [facility].

## 2021-01-15 ENCOUNTER — Other Ambulatory Visit: Payer: Self-pay

## 2021-01-15 ENCOUNTER — Ambulatory Visit (HOSPITAL_COMMUNITY): Payer: HMO | Admitting: Physical Therapy

## 2021-01-15 DIAGNOSIS — R262 Difficulty in walking, not elsewhere classified: Secondary | ICD-10-CM

## 2021-01-15 DIAGNOSIS — L97411 Non-pressure chronic ulcer of right heel and midfoot limited to breakdown of skin: Secondary | ICD-10-CM

## 2021-01-15 DIAGNOSIS — G8929 Other chronic pain: Secondary | ICD-10-CM

## 2021-01-15 DIAGNOSIS — E08621 Diabetes mellitus due to underlying condition with foot ulcer: Secondary | ICD-10-CM

## 2021-01-15 DIAGNOSIS — M25562 Pain in left knee: Secondary | ICD-10-CM

## 2021-01-15 NOTE — Therapy (Signed)
[place], [place], [address] Phone: [phone]   Fax:  [phone]  Physical Therapy Treatment  Patient Details  Name: [name] MRN: [id number] Date of Birth: Jan 23, 1946 Referring Provider (PT): Perlie Mayo   Encounter Date: 01/15/2021   PT End of Session - 01/15/21 1135    Visit Number 23    Number of Visits 30    Date for PT Re-Evaluation 02/10/21    Authorization Type [employer], no VL, no auth , $15 co pay    Progress Note Due on Visit 32    PT Start Time 1048    PT Stop Time 1154    PT Time Calculation (min) 66 min    Equipment Utilized During Treatment Gait belt    Activity Tolerance Patient tolerated treatment well;Patient limited by fatigue    Behavior During Therapy [facility] for tasks assessed/performed           Past Medical History:  Diagnosis Date  . Aftercare following surgery of the circulatory system, [place] 12/04/2013  . Angina decubitus ([place]) 05/19/2016  . Arrhythmia 05/20/2016  . Arthritis   . ARTHRITIS, RIGHT FOOT 06/26/2008   Qualifier: Diagnosis of  By: Aline Brochure MD, Dorothyann Peng    . Cardiomyopathy- EF NL 01/2013, now 30-35% echo 03/14/2012  . Carotid artery occlusion    left s/p CEA  . Cellulitis of left foot 05/07/2018  . CHF (congestive heart failure) ([place])   . Critical lower limb ischemia (HCC) 02/28/2014   Critical limb ischemia   . Diabetes mellitus   . Elevated troponin 04/11/2016  . GERD (gastroesophageal reflux disease)   . History of stroke June 2013 03/14/2012  . HOH (hard of hearing)   . Hypertension   . Hypothyroidism   . Nonhealing skin ulcer ([place]) 10/28/2014  . Obstructive sleep apnea   . Orthostatic hypotension   . Osteomyelitis ([place])    left great toe  . Pain in the chest 05/17/2016  . Peripheral arterial disease ([place]), s/p PTA x 2 RLE    nonhealing ulcers bilaterally on each great toe  . Pneumonia   . PONV (postoperative nausea and vomiting)   . Pulmonary  nodule 05/17/2016  . Restless leg syndrome   . Shortness of breath   . Stroke [facility]) March 08, 2012  . Toe osteomyelitis, left ([place])   . Ulcer of great toe, left, with necrosis of bone ([place])   . Wears dentures     Past Surgical History:  Procedure Laterality Date  . AMPUTATION TOE Left 07/25/2018   Procedure: AMPUTATION TOE INTERPHALANGEAL HALLUX LEFT;  Surgeon: Evelina Bucy, DPM;  Location: [place];  Service: Podiatry;  Laterality: Left;  . ANGIOPLASTY  02/28/14   diamond back orbital rotational atherectomy of Rt. tibial  . BACK SURGERY    . BONE BIOPSY Left 07/25/2018   Procedure: SUPERFICIAL BONE BIOPSY;  Surgeon: Evelina Bucy, DPM;  Location: [place];  Service: Podiatry;  Laterality: Left;  . CARDIAC CATHETERIZATION N/A 05/19/2016   Procedure: Right/Left Heart Cath and Coronary Angiography;  Surgeon: Belva Crome, MD;  Location: [place] [facility];  Service: Cardiovascular;  Laterality: N/A;  . CERVICAL FUSION    . ENDARTERECTOMY Left 11/29/2013   Procedure: ENDARTERECTOMY CAROTID;  Surgeon: Serafina Mitchell, MD;  Location: [place];  Service: Vascular;  Laterality: Left;  . ESOPHAGOGASTRODUODENOSCOPY  02/2010   Dr. Gala Romney: patient presented with food impaction, schatzki ring with superimposed component of stricture  with erosive reflux esophagitis, s/p disimpaction but dilation planned at later date   . ESOPHAGOGASTRODUODENOSCOPY (EGD) WITH PROPOFOL N/A 08/13/2019   Dr. Gala Romney: Erosive reflux esophagitis with mild stricture and incidental Mallory-Weiss tear which precluded esophageal dilation.  Medium sized hiatal hernia.  [name] ESOPHAGOGASTRODUODENOSCOPY (EGD) WITH PROPOFOL N/A 10/11/2019   Dr. Gala Romney: Esophageal stenosis status post dilation, moderate hiatal hernia  . EYE SURGERY    . FLEXIBLE SIGMOIDOSCOPY N/A 08/13/2019   Procedure: FLEXIBLE SIGMOIDOSCOPY;  Surgeon: [name], MD;  [facility];  Service: Endoscopy;  Laterality: N/A;  colonoscopy aboerted due to formed stool  and poor prep  . FOOT SURGERY    . KNEE ARTHROSCOPY WITH MEDIAL MENISECTOMY Left 05/06/2020   Procedure: KNEE ARTHROSCOPY WITH MEDIAL MENISCECTOMY AND LATERAL MENISCECTOMY;  Surgeon: [name], MD;  Location: [facility];  Service: Orthopedics;  Laterality: Left;  . Lower ext duplex doppler  03/14/14   Rt ABI 1.2  . LOWER EXTREMITY ANGIOGRAM Bilateral 02/18/2014   Procedure: LOWER EXTREMITY ANGIOGRAM;  Surgeon: [name], MD;  Location: [facility] CATH LAB;  Service: Cardiovascular;  Laterality: Bilateral;  . LOWER EXTREMITY ANGIOGRAM N/A 10/31/2014   Procedure: LOWER EXTREMITY ANGIOGRAM;  Surgeon: [name], MD;  Location: [facility] CATH LAB;  Service: Cardiovascular;  Laterality: N/A;  . Venia Minks DILATION N/A 10/11/2019   Procedure: Venia Minks DILATION;  Surgeon: [name], MD;  [facility];  Service: Endoscopy;  Laterality: N/A;  . MULTIPLE TOOTH EXTRACTIONS    . PV angiogram  02/18/2014   tibial vessel diseas bil.  [name] SPINE SURGERY    . tendon achillies lengthing and sesamoid      There were no vitals filed for this visit.                    Wound Therapy - 01/15/21 1136    Subjective no pain or issues    Patient and Family Stated Goals to have wound to heal and to be able to walk    Date of Onset --   4-5 years ago   Prior Treatments wound clinics, neosporin on bandaid but bandaid wound stick.    Evaluation and Treatment Procedures Explained to Patient/Family Yes    Evaluation and Treatment Procedures agreed to    Wound Properties Date First Assessed: 10/20/20 Time First Assessed: 1000 Wound Type: Diabetic ulcer Location: Foot Location Orientation: Anterior;Right Wound Description (Comments): bottom of foot Present on Admission: Yes   Dressing Type Impregnated gauze (bismuth)   medihoney gel   Dressing Changed Changed    Dressing Status Old drainage    Dressing Change Frequency PRN    Site / Wound Assessment Pink;Red;Clean    % Wound base  Yellow/Fibrinous Exudate 100%    Drainage Amount Minimal    Drainage Description Serosanguineous    Treatment Cleansed;Debridement (Selective)    Selective Debridement - Location edges and calloused perimeter; expect wound to get larger with debridement    Selective Debridement - Tools Used Forceps;Scalpel    Selective Debridement - Tissue Removed callous devitalized tissue.    Wound Therapy - Clinical Statement Some maceration present around wound.  Noted approximation from 7-11 0'clock but with undermining/epibole edges 12-6 0'clock.  Debrided away the edges in this area to promote approximation.  Continued with medihoney, xeroform and 2X2.  Pt continues to wear his new shoes with orthotic cutout to reduce pressure on woundbed.    Wound Therapy - Functional Problem List difficulty walking, washing,  dressing    Factors Delaying/Impairing Wound Healing Diabetes Mellitus;Immobility    Wound Therapy - Frequency 2X / week    Wound Therapy - Current Recommendations PT    Wound Plan Continue wound care with appropriate dressings.    Dressing  medihoney wound bed, xeroform on top, vaseline perimeter, 2x2 and medipore tape.            [place] Adult PT Treatment/Exercise - 01/15/21 0001      Knee/Hip Exercises: Standing   Hip Flexion Both;3 sets;10 reps    Hip Flexion Limitations 2# weights 1 UE, trunk stable and neutral LE's lifting    Hip Abduction AROM;Stengthening;Knee straight;3 sets;10 reps    Abduction Limitations 2#    Hip Extension Stengthening;Both;Knee straight;3 sets;10 reps    Extension Limitations 2#, 5" holds    Functional Squat 15 reps    SLS with Vectors 2 sets of 3x5" holds each way with 1 HHA    Other Standing Knee Exercises sidestep front of mat 5RT    Other Standing Knee Exercises cues for trunk stability, engaging glutes      Knee/Hip Exercises: Seated   Sit to Sand 15 reps;without UE support                    PT Short Term Goals - 01/13/21 1123      PT  SHORT TERM GOAL #1   Title Patient will be independent in self management strategies to improve quality of life and functional outcomes.    Baseline performs 1-2x/day    Status Achieved      PT SHORT TERM GOAL #2   Title Patient will be able to report at least 25% improvement in overall mobility.    Baseline feels 100% better    Status Achieved      PT SHORT TERM GOAL #3   Title Patient's wound will be reduced in volume by at least 25% to demonstrate improved wound healing    Baseline was 2.59m^3, now .13 m^3    Status Achieved             PT Long Term Goals - 01/13/21 1130      PT LONG TERM GOAL #1   Title Patient will be able to ambulate at least 100 feet with RW in 2 minutes to demonstrate improved ambulatory endurance    Baseline 11/19/20:  2MWT 133ft with rollator, improved foot clearance followng cueing from therapist    Status Achieved      PT LONG TERM GOAL #2   Title Patient's wound will reduce in overall volume byt at least 50% to demonstrate improved wound healing    Baseline was 2.66m^3 and now .43 m ^3    Status Achieved      PT LONG TERM GOAL #3   Title Patient will be able to demonstate 5-110 degrees in left knee to demonstrate improved knee mobility    Baseline 11/19/20: Lt 18-123 degrees, Rt 5-120 degrees    Status On-going      PT LONG TERM GOAL #4   Title Wound will be completely healed to reduce risk of infection    Time 4    Period Weeks    Status New    Target Date 02/10/21      PT LONG TERM GOAL #5   Title Patient will be able to perform 5x sit to stand without use of arms in < 14.5 seconds to demonstrate improved functional strength    Time 4  Period Weeks    Status New    Target Date 02/10/21                 Plan - 01/15/21 1139    Clinical Impression Statement See wound assessment above in wound documentation.  Pt with question again regarding his HEP with instructions to bring his whole folder to therapy to review but ultimately he  needs to be doing something and those most challenging to him.  Pt verbalized understanding.   Continued with focus on LE strengthening and stability.    Added vectors to focus on this. This was the most challenging, especially when stabilizing with Rt LE.   Cues for form and engaging glutes/abductors with [place] activities. Pt continues to mobilize slowly and with noted weakness.  Encouraged to be more compliant with HEP.    Personal Factors and Comorbidities Comorbidity 1;Comorbidity 3+;Comorbidity 2    Comorbidities CHF, left knee surgery, right chronic foot wound, DB, restless leg syndromes    Examination-Activity Limitations Bathing;Lift;Locomotion Level;Transfers;Stand;Stairs;Squat    Examination-Participation Restrictions Community Activity;Meal Prep    Stability/Clinical Decision Making Evolving/Moderate complexity    Rehab Potential Fair    PT Frequency 2x / week    PT Duration 4 weeks    PT Treatment/Interventions ADLs/Self Care Home Management;Other (comment);Moist Heat;Balance training;Therapeutic exercise;Therapeutic activities;Functional mobility training;Stair training;Gait training;DME Instruction;Ultrasound;Neuromuscular re-education;Patient/family education;Orthotic Fit/Training;Manual techniques;Taping;Passive range of motion;Electrical Stimulation    PT Next Visit Plan review HEP and consoladate for pt when he brings in his folder.  Weakest mm remain hip abductors/extensors. Focus primarily on  standing exercises and balance training continue wound care.  F/U on increased frequency of HEP compliance; work on improving glute strength.    PT Home Exercise Plan calf stretch with towel, bridges.  2/8: bridge, SLR, hip abduction, hip extension   2/10:  sit to stands; 2/17:  LAQ with wt, supine hip abduction, sidelying hip abduction, 2/21 standing hip abd; 3/2:SAQ; 3/8: long sitting hamstring strech, hip abduction; 4/12: quadruped UE, LE, firehydrant  4/19: vectors, standing hip abd/ext/march     Consulted and Agree with Plan of Care Patient           Patient will benefit from skilled therapeutic intervention in order to improve the following deficits and impairments:  Abnormal gait,Decreased endurance,Decreased skin integrity,Increased edema,Decreased activity tolerance,Decreased balance,Decreased mobility,Decreased knowledge of use of DME,Decreased strength,Difficulty walking,Pain,Decreased range of motion,Impaired flexibility  Visit Diagnosis: Diabetic ulcer of right midfoot associated with diabetes mellitus due to underlying condition, limited to breakdown of skin ([place])  Difficulty in walking, not elsewhere classified  Chronic pain of left knee     Problem List Patient Active Problem List   Diagnosis Date Noted  . Gait abnormality 09/30/2020  . Mild non proliferative diabetic retinopathy ([place]) 07/16/2020  . Need for immunization against influenza 06/03/2020  . S/P left knee arthroscopy 05/06/20 05/13/2020  . Abnormal MRI 03/26/2020  . Joint disorder of knee 03/26/2020  . Fall at home, initial encounter 03/13/2020  . Anemia 09/03/2019  . Reflux esophagitis 09/03/2019  . Esophageal dysphagia 05/30/2019  . Constipation 05/30/2019  . Positive colorectal cancer screening using Cologuard test 05/30/2019  . Diabetic foot ulcer ([place]) 09/06/2018  . Congestive heart failure ([place]) 05/17/2016  . Bilateral carotid artery disease ([place]) 07/08/2014  . Obstructive sleep apnea 02/12/2014  . Restless leg syndrome 10/23/2013  . Overweight with body mass index (BMI) of 29 to 29.9 in adult 10/23/2013  . Hyperlipidemia 07/16/2013  . Peripheral arterial disease ([place]) 07/16/2013  .  Hypertension 03/14/2012  . Type 2 diabetes mellitus with circulatory disorder ([place]) 03/14/2012  . Peripheral neuropathy 03/14/2012  . Cardiomyopathy- EF NL 01/2013, now 30-35% echo 03/14/2012   Teena Irani, PTA/CLT [phone]  Teena Irani 01/15/2021, 11:55 AM  [facility] [address] [place], [place], [address] Phone: [phone]   Fax:  [phone]  Name: [name] MRN: [id number] Date of Birth: Sep 05, 1946

## 2021-01-16 DIAGNOSIS — G2581 Restless legs syndrome: Secondary | ICD-10-CM | POA: Diagnosis not present

## 2021-01-16 DIAGNOSIS — D509 Iron deficiency anemia, unspecified: Secondary | ICD-10-CM | POA: Diagnosis not present

## 2021-01-16 DIAGNOSIS — E559 Vitamin D deficiency, unspecified: Secondary | ICD-10-CM | POA: Diagnosis not present

## 2021-01-16 DIAGNOSIS — E538 Deficiency of other specified B group vitamins: Secondary | ICD-10-CM | POA: Diagnosis not present

## 2021-01-17 ENCOUNTER — Other Ambulatory Visit: Payer: Self-pay | Admitting: Nurse Practitioner

## 2021-01-20 ENCOUNTER — Ambulatory Visit (HOSPITAL_COMMUNITY): Payer: HMO | Admitting: Physical Therapy

## 2021-01-22 ENCOUNTER — Ambulatory Visit (INDEPENDENT_AMBULATORY_CARE_PROVIDER_SITE_OTHER): Payer: HMO | Admitting: Nurse Practitioner

## 2021-01-22 ENCOUNTER — Other Ambulatory Visit: Payer: Self-pay

## 2021-01-22 ENCOUNTER — Encounter: Payer: Self-pay | Admitting: Nurse Practitioner

## 2021-01-22 VITALS — BP 93/45 | HR 96 | Temp 98.3°F | Resp 18 | Ht 70.0 in | Wt 205.0 lb

## 2021-01-22 DIAGNOSIS — E782 Mixed hyperlipidemia: Secondary | ICD-10-CM

## 2021-01-22 DIAGNOSIS — N529 Male erectile dysfunction, unspecified: Secondary | ICD-10-CM | POA: Diagnosis not present

## 2021-01-22 DIAGNOSIS — I10 Essential (primary) hypertension: Secondary | ICD-10-CM

## 2021-01-22 DIAGNOSIS — E1159 Type 2 diabetes mellitus with other circulatory complications: Secondary | ICD-10-CM

## 2021-01-22 DIAGNOSIS — G2581 Restless legs syndrome: Secondary | ICD-10-CM | POA: Diagnosis not present

## 2021-01-22 DIAGNOSIS — E08621 Diabetes mellitus due to underlying condition with foot ulcer: Secondary | ICD-10-CM

## 2021-01-22 DIAGNOSIS — L97411 Non-pressure chronic ulcer of right heel and midfoot limited to breakdown of skin: Secondary | ICD-10-CM

## 2021-01-22 DIAGNOSIS — E039 Hypothyroidism, unspecified: Secondary | ICD-10-CM

## 2021-01-22 MED ORDER — FREESTYLE LIBRE 14 DAY SENSOR MISC: 1.0000 | 5 refills | Status: DC

## 2021-01-22 NOTE — Assessment & Plan Note (Signed)
-  checking labs 

## 2021-01-22 NOTE — Patient Instructions (Signed)
Please have fasting labs drawn this week.  Please see cardiology related to your low blood pressure. Since you are asymptomatic, I am not changing medicines today. If you feel lightheaded or like you are going to pass out prior to your cardiology appointment, please call the office and I will make medication adjustment.

## 2021-01-22 NOTE — Assessment & Plan Note (Signed)
Lab Results  Component Value Date   HGBA1C 7.8 (H) 09/25/2020   -drawing A1c today -taking jardiance and glimepiride for glycemic control -on statin but NOT on ACEi/ARB; will check microalbumin

## 2021-01-22 NOTE — Assessment & Plan Note (Signed)
-  BP is running low today, but he is asymptomatic -he is on carvedilol for CHF and lasix/K for fluid status -he has 1-2+ pitting edema in his legs, so I don't want to cut his lasix, but he is on a small dose of carvedilol -since he is asymptomatic and has CHF, will defer change in treatment regimen to his cardiologist

## 2021-01-22 NOTE — Assessment & Plan Note (Signed)
-  on pramipexol; followed by neurology and had appt prior to last visit

## 2021-01-22 NOTE — Assessment & Plan Note (Signed)
-  we discussed treatment options -he will see urology in a week for this -no viagra d/t taking nitrates for heart issues

## 2021-01-22 NOTE — Assessment & Plan Note (Signed)
-  followed by PT for wound care

## 2021-01-22 NOTE — Progress Notes (Signed)
Established Patient Office Visit  Subjective:  Patient ID: [name], male    DOB: Aug 20, 1946  Age: 75 y.o. MRN: [id number]  CC:  Chief Complaint  Patient presents with  . Follow-up  . Peripheral Neuropathy    HPI [name] presents for lab follow-up.  He states that he has been having erectile dysfunction, and that his his biggest issue.  He also statws that his BP has been running low, but he has not had any symptoms like dizziness or lightheadedness.  Past Medical History:  Diagnosis Date  . Aftercare following surgery of the circulatory system, [place] 12/04/2013  . Angina decubitus ([place]) 05/19/2016  . Arrhythmia 05/20/2016  . Arthritis   . ARTHRITIS, RIGHT FOOT 06/26/2008   Qualifier: Diagnosis of  By: Aline Brochure MD, Dorothyann Peng    . Cardiomyopathy- EF NL 01/2013, now 30-35% echo 03/14/2012  . Carotid artery occlusion    left s/p CEA  . Cellulitis of left foot 05/07/2018  . CHF (congestive heart failure) ([place])   . Critical lower limb ischemia (HCC) 02/28/2014   Critical limb ischemia   . Diabetes mellitus   . Elevated troponin 04/11/2016  . GERD (gastroesophageal reflux disease)   . History of stroke June 2013 03/14/2012  . HOH (hard of hearing)   . Hypertension   . Hypothyroidism   . Nonhealing skin ulcer ([place]) 10/28/2014  . Obstructive sleep apnea   . Orthostatic hypotension   . Osteomyelitis ([place])    left great toe  . Pain in the chest 05/17/2016  . Peripheral arterial disease ([place]), s/p PTA x 2 RLE    nonhealing ulcers bilaterally on each great toe  . Pneumonia   . PONV (postoperative nausea and vomiting)   . Pulmonary nodule 05/17/2016  . Restless leg syndrome   . Shortness of breath   . Stroke [facility]) March 08, 2012  . Toe osteomyelitis, left ([place])   . Ulcer of great toe, left, with necrosis of bone ([place])   . Wears dentures     Past Surgical History:  Procedure Laterality Date  . AMPUTATION TOE Left 07/25/2018   Procedure: AMPUTATION TOE INTERPHALANGEAL  HALLUX LEFT;  Surgeon: Evelina Bucy, DPM;  Location: [facility];  Service: Podiatry;  Laterality: Left;  . ANGIOPLASTY  02/28/14   diamond back orbital rotational atherectomy of Rt. tibial  . BACK SURGERY    . BONE BIOPSY Left 07/25/2018   Procedure: SUPERFICIAL BONE BIOPSY;  Surgeon: Evelina Bucy, DPM;  Location: [facility];  Service: Podiatry;  Laterality: Left;  . CARDIAC CATHETERIZATION N/A 05/19/2016   Procedure: Right/Left Heart Cath and Coronary Angiography;  Surgeon: Belva Crome, MD;  Location: [facility] CV LAB;  Service: Cardiovascular;  Laterality: N/A;  . CERVICAL FUSION    . ENDARTERECTOMY Left 11/29/2013   Procedure: ENDARTERECTOMY CAROTID;  Surgeon: Serafina Mitchell, MD;  Location[facility] OR;  Service: Vascular;  Laterality: Left;  . ESOPHAGOGASTRODUODENOSCOPY  02/2010   Dr. Gala Romney: patient presented with food impaction, schatzki ring with superimposed component of stricture with erosive reflux esophagitis, s/p disimpaction but dilation planned at later date   . ESOPHAGOGASTRODUODENOSCOPY (EGD) WITH PROPOFOL N/A 08/13/2019   Dr. Gala Romney: Erosive reflux esophagitis with mild stricture and incidental Mallory-Weiss tear which precluded esophageal dilation.  Medium sized hiatal hernia.  [name] ESOPHAGOGASTRODUODENOSCOPY (EGD) WITH PROPOFOL N/A 10/11/2019   Dr. Gala Romney: Esophageal stenosis status post dilation, moderate hiatal hernia  . EYE SURGERY    . FLEXIBLE SIGMOIDOSCOPY N/A 08/13/2019  Procedure: FLEXIBLE SIGMOIDOSCOPY;  Surgeon: [name], MD;  [facility];  Service: Endoscopy;  Laterality: N/A;  colonoscopy aboerted due to formed stool and poor prep  . FOOT SURGERY    . KNEE ARTHROSCOPY WITH MEDIAL MENISECTOMY Left 05/06/2020   Procedure: KNEE ARTHROSCOPY WITH MEDIAL MENISCECTOMY AND LATERAL MENISCECTOMY;  Surgeon: [name], MD;  Location: [facility];  Service: Orthopedics;  Laterality: Left;  . Lower ext duplex doppler  03/14/14   Rt ABI 1.2  . LOWER EXTREMITY  ANGIOGRAM Bilateral 02/18/2014   Procedure: LOWER EXTREMITY ANGIOGRAM;  Surgeon: [name], MD;  Location: [facility] CATH LAB;  Service: Cardiovascular;  Laterality: Bilateral;  . LOWER EXTREMITY ANGIOGRAM N/A 10/31/2014   Procedure: LOWER EXTREMITY ANGIOGRAM;  Surgeon: [name], MD;  Location: [facility] CATH LAB;  Service: Cardiovascular;  Laterality: N/A;  . Venia Minks DILATION N/A 10/11/2019   Procedure: Venia Minks DILATION;  Surgeon: [name], MD;  [facility];  Service: Endoscopy;  Laterality: N/A;  . MULTIPLE TOOTH EXTRACTIONS    . PV angiogram  02/18/2014   tibial vessel diseas bil.  [name] SPINE SURGERY    . tendon achillies lengthing and sesamoid      Family History  Problem Relation Age of Onset  . Heart disease Mother   . Hypertension Mother   . Heart attack Mother   . Hypertension Father   . Diabetes Father   . Diabetes Son   . Heart disease Son   . Hypertension Son   . Colon cancer Neg Hx     Social History   Socioeconomic History  . Marital status: Married    Spouse name: [name]   . Number of children: 4  . Years of education: college  . Highest education level: Not on file  Occupational History  . Occupation: Firefighter   Tobacco Use  . Smoking status: Former Smoker    Years: 1.00    Types: Pipe    Quit date: 07/08/1977    Years since quitting: 43.5  . Smokeless tobacco: Never Used  Vaping Use  . Vaping Use: Never used  Substance and Sexual Activity  . Alcohol use: No    Alcohol/week: 0.0 standard drinks  . Drug use: No  . Sexual activity: Not on file  Other Topics Concern  . Not on file  Social History Narrative   Retired from medical -[employer]      Lives with [name]    Cat: Jazz      Enjoy: sleeping a lot       Diet: eats all food groups -chicken, steak-chopped, mostly veggie   Caffeine: coffee and soda "a lot"   Water: 3-4 cups daily      Wears seat belt   Does not use phone while driving    Oceanographer at home     weapons at home        Social Determinants of Health   Financial Resource Strain: [place]   . Difficulty of Paying Living Expenses: Not hard at all  Food Insecurity: No Food Insecurity  . Worried About Charity fundraiser in the Last Year: Never true  . Ran Out of Food in the Last Year: Never true  Transportation Needs: No Transportation Needs  . Lack of Transportation (Medical): No  . Lack of Transportation (Non-Medical): No  Physical Activity: Inactive  . Days of Exercise per Week: 0 days  . Minutes of Exercise per Session: 0 min  Stress: No Stress Concern Present  . Feeling of Stress : Only a little  Social Connections: Moderately Isolated  . Frequency of Communication with Friends and Family: Once a week  . Frequency of Social Gatherings with Friends and Family: Once a week  . Attends Religious Services: More than 4 times per year  . Active Member of Clubs or Organizations: No  . Attends Archivist Meetings: Never  . Marital Status: Married  Human resources officer Violence: Not At Risk  . Fear of Current or Ex-Partner: No  . Emotionally Abused: No  . Physically Abused: No  . Sexually Abused: No    Outpatient Medications Prior to Visit  Medication Sig Dispense Refill  . aspirin EC 81 MG EC tablet Take 1 tablet (81 mg total) by mouth daily. (Patient taking differently: Take 81 mg by mouth at bedtime.)    . b complex vitamins tablet Take 1 tablet by mouth daily.    . carvedilol (COREG) 3.125 MG tablet Take 1 tablet (3.125 mg total) by mouth 2 (two) times daily with a meal. 180 tablet 3  . Cholecalciferol (VITAMIN D3) 5000 units TABS Take 5,000 Units by mouth 2 (two) times daily.     . clopidogrel (PLAVIX) 75 MG tablet TAKE ONE (1) TABLET BY MOUTH EVERY DAY 90 tablet 0  . Continuous Blood Gluc Receiver (FREESTYLE LIBRE 14 DAY READER) DEVI     . Cyanocobalamin 1500 MCG TBDP Take 1,500 mcg by mouth daily.     . DULoxetine (CYMBALTA) 60 MG capsule Take 1 capsule (60 mg  total) by mouth daily. 90 capsule 1  . empagliflozin (JARDIANCE) 25 MG TABS tablet Take 1 tablet (25 mg total) by mouth daily before breakfast. 90 tablet 1  . furosemide (LASIX) 40 MG tablet TAKE ONE TABLET ($RemoveBef'40MG'zEGOcTccxm$  TOTAL) BY MOUTH TWO TIMES DAILY. 180 tablet 3  . glimepiride (AMARYL) 4 MG tablet Take 1 tablet (4 mg total) by mouth daily with breakfast. 90 tablet 1  . levothyroxine (SYNTHROID) 75 MCG tablet Take 1 tablet (75 mcg total) by mouth daily before breakfast. 90 tablet 1  . lidocaine (LIDODERM) 5 % Place 1 patch onto the skin daily. Remove & Discard patch within 12 hours or as directed by MD 5 patch 0  . linaclotide (LINZESS) 290 MCG CAPS capsule Take 290 mcg by mouth daily before breakfast.    . Multiple Vitamin (MULTIVITAMIN WITH MINERALS) TABS tablet Take 1 tablet by mouth daily.    . nitroGLYCERIN (NITROSTAT) 0.4 MG SL tablet Place 1 tablet (0.4 mg total) under the tongue every 5 (five) minutes as needed for chest pain. 25 tablet 3  . pantoprazole (PROTONIX) 40 MG tablet TAKE ONE (1) TABLET BY MOUTH EVERY DAY 30 tablet 11  . pramipexole (MIRAPEX) 0.25 MG tablet Take 2 tablets (0.5 mg total) by mouth 2 (two) times daily. Take 0.$RemoveBefor'5mg'VcALxYEsGmkq$  by mouth at 7 PM and 0.$Remove'5mg'axxmBfu$  at 9 PM daily. 360 tablet 3  . rosuvastatin (CRESTOR) 20 MG tablet Take 1 tablet (20 mg total) by mouth daily. 90 tablet 1  . UNABLE TO FIND Home health supplies for CPAP 1 Product 0  . UNABLE TO FIND [place] sock aid to help with support hose. Size XL.  Dx:I73.9, G25.81, R60.9 1 each 0  . Continuous Blood Gluc Sensor (FREESTYLE LIBRE 14 DAY SENSOR) MISC USE 1 SENSOR EVERY 14 DAYS. 2 each 5  . potassium chloride (KLOR-CON) 10 MEQ tablet Take 1 tablet (10 mEq total) by mouth daily. Please schedule appointment  with Dr. Gwenlyn Found 90 tablet 0  . HYDROcodone-acetaminophen (NORCO/VICODIN) 5-325 MG tablet Take 1-2 tablets by mouth every 6 (six) hours as needed. 8 tablet 0   No facility-administered medications prior to visit.    Allergies   Allergen Reactions  . Codeine Nausea And Vomiting  . Tramadol Nausea Only    ROS Review of Systems  Constitutional: Negative.   Respiratory: Negative.   Cardiovascular: Positive for leg swelling. Negative for chest pain and palpitations.  Genitourinary:       Erectile dysfunction  Musculoskeletal: Negative.   Psychiatric/Behavioral: Negative.       Objective:    Physical Exam Constitutional:      Appearance: Normal appearance.  Cardiovascular:     Rate and Rhythm: Normal rate and regular rhythm.     Pulses: Normal pulses.     Heart sounds: Murmur heard.      Comments: 2+ pitting edema to left leg; 1 + pitting edema to right leg Pulmonary:     Effort: Pulmonary effort is normal.     Breath sounds: Normal breath sounds.  Musculoskeletal:     Comments: Uses cane for ambulation  Neurological:     Mental Status: He is alert.  Psychiatric:        Mood and Affect: Mood normal.        Behavior: Behavior normal.        Thought Content: Thought content normal.        Judgment: Judgment normal.     BP (!) 93/45   Pulse 96   Temp 98.3 F (36.8 C)   Resp 18   Ht $R'5\' 10"'Cj$  (1.778 m)   Wt 205 lb (93 kg)   SpO2 92%   BMI 29.41 kg/m  Wt Readings from Last 3 Encounters:  01/22/21 205 lb (93 kg)  01/07/21 195 lb 9.6 oz (88.7 kg)  12/22/20 195 lb 3.2 oz (88.5 kg)     There are no preventive care reminders to display for this patient.  There are no preventive care reminders to display for this patient.  Lab Results  Component Value Date   TSH 4.410 09/25/2020   Lab Results  Component Value Date   WBC 5.1 09/25/2020   HGB 11.3 (L) 09/25/2020   HCT 33.2 (L) 09/25/2020   MCV 100 (H) 09/25/2020   PLT 207 09/25/2020   Lab Results  Component Value Date   NA 137 12/22/2020   K 4.0 12/22/2020   CO2 24 12/22/2020   GLUCOSE 231 (H) 12/22/2020   BUN 13 12/22/2020   CREATININE 0.82 12/22/2020   BILITOT 0.5 09/25/2020   ALKPHOS 87 09/25/2020   AST 13 09/25/2020    ALT 12 09/25/2020   PROT 7.0 09/25/2020   ALBUMIN 4.3 09/25/2020   CALCIUM 9.0 12/22/2020   ANIONGAP 13 05/01/2020   EGFR 90 12/22/2020   Lab Results  Component Value Date   CHOL 141 09/25/2020   Lab Results  Component Value Date   HDL 50 09/25/2020   Lab Results  Component Value Date   LDLCALC 75 09/25/2020   Lab Results  Component Value Date   TRIG 86 09/25/2020   Lab Results  Component Value Date   CHOLHDL 2.8 09/25/2020   Lab Results  Component Value Date   HGBA1C 7.8 (H) 09/25/2020      Assessment & Plan:   Problem List Items Addressed This Visit      Cardiovascular and Mediastinum   Hypertension - Primary (Chronic)    -  BP is running low today, but he is asymptomatic -he is on carvedilol for CHF and lasix/K for fluid status -he has 1-2+ pitting edema in his legs, so I don't want to cut his lasix, but he is on a small dose of carvedilol -since he is asymptomatic and has CHF, will defer change in treatment regimen to his cardiologist      Relevant Orders   CBC with Differential/Platelet   CMP14+EGFR   Lipid Panel With LDL/HDL Ratio     Endocrine   Type 2 diabetes mellitus with circulatory disorder ([place])    Lab Results  Component Value Date   HGBA1C 7.8 (H) 09/25/2020   -drawing A1c today -taking jardiance and glimepiride for glycemic control -on statin but NOT on ACEi/ARB; will check microalbumin      Relevant Orders   Lipid Panel With LDL/HDL Ratio   Microalbumin / creatinine urine ratio   Hemoglobin A1c   Diabetic foot ulcer ([place])    -followed by PT for wound care        Other   Hyperlipidemia (Chronic)    -checking labs      Restless leg syndrome (Chronic)    -on pramipexol; followed by neurology and had appt prior to last visit      Erectile dysfunction    -we discussed treatment options -he will see urology in a week for this -no viagra d/t taking nitrates for heart issues       Other Visit Diagnoses    Hypothyroidism,  unspecified type       Relevant Orders   TSH + free T4      Meds ordered this encounter  Medications  . Continuous Blood Gluc Sensor (FREESTYLE LIBRE 14 DAY SENSOR) MISC    Sig: Inject 1 each into the skin every 14 (fourteen) days.    Dispense:  2 each    Refill:  5    Follow-up: Return in about 3 months (around 04/24/2021) for Lab follow-up (HLD, DM, HTN).    Noreene Larsson, NP

## 2021-01-23 ENCOUNTER — Encounter (HOSPITAL_COMMUNITY): Payer: Self-pay

## 2021-01-23 ENCOUNTER — Ambulatory Visit (HOSPITAL_COMMUNITY): Payer: HMO | Attending: Family Medicine

## 2021-01-23 VITALS — BP 128/62 | HR 70

## 2021-01-23 DIAGNOSIS — M25562 Pain in left knee: Secondary | ICD-10-CM | POA: Insufficient documentation

## 2021-01-23 DIAGNOSIS — L97411 Non-pressure chronic ulcer of right heel and midfoot limited to breakdown of skin: Secondary | ICD-10-CM | POA: Diagnosis not present

## 2021-01-23 DIAGNOSIS — R262 Difficulty in walking, not elsewhere classified: Secondary | ICD-10-CM | POA: Insufficient documentation

## 2021-01-23 DIAGNOSIS — G8929 Other chronic pain: Secondary | ICD-10-CM | POA: Diagnosis not present

## 2021-01-23 DIAGNOSIS — E08621 Diabetes mellitus due to underlying condition with foot ulcer: Secondary | ICD-10-CM | POA: Diagnosis not present

## 2021-01-23 DIAGNOSIS — M5440 Lumbago with sciatica, unspecified side: Secondary | ICD-10-CM | POA: Diagnosis not present

## 2021-01-23 DIAGNOSIS — S0990XA Unspecified injury of head, initial encounter: Secondary | ICD-10-CM | POA: Diagnosis not present

## 2021-01-23 DIAGNOSIS — M542 Cervicalgia: Secondary | ICD-10-CM | POA: Diagnosis not present

## 2021-01-23 NOTE — Therapy (Signed)
[place], [place], [address] Phone: [phone]   Fax:  [phone]  Wound Care Therapy  Patient Details  Name: [name] MRN: [id number] Date of Birth: September 10, 1946 Referring Provider (PT): Perlie Mayo   Encounter Date: 01/23/2021   PT End of Session - 01/23/21 1752    Visit Number 24    Number of Visits 30    Date for PT Re-Evaluation 02/10/21    Authorization Type [employer], no VL, no auth , $15 co pay    Progress Note Due on Visit 65    PT Start Time 1318    PT Stop Time 1355    PT Time Calculation (min) 37 min    Equipment Utilized During Treatment Gait belt    Activity Tolerance Patient tolerated treatment well;Patient limited by fatigue    Behavior During Therapy [facility] for tasks assessed/performed           Past Medical History:  Diagnosis Date  . Aftercare following surgery of the circulatory system, [place] 12/04/2013  . Angina decubitus ([place]) 05/19/2016  . Arrhythmia 05/20/2016  . Arthritis   . ARTHRITIS, RIGHT FOOT 06/26/2008   Qualifier: Diagnosis of  By: Aline Brochure MD, Dorothyann Peng    . Cardiomyopathy- EF NL 01/2013, now 30-35% echo 03/14/2012  . Carotid artery occlusion    left s/p CEA  . Cellulitis of left foot 05/07/2018  . CHF (congestive heart failure) ([place])   . Critical lower limb ischemia (HCC) 02/28/2014   Critical limb ischemia   . Diabetes mellitus   . Elevated troponin 04/11/2016  . GERD (gastroesophageal reflux disease)   . History of stroke June 2013 03/14/2012  . HOH (hard of hearing)   . Hypertension   . Hypothyroidism   . Nonhealing skin ulcer ([place]) 10/28/2014  . Obstructive sleep apnea   . Orthostatic hypotension   . Osteomyelitis ([place])    left great toe  . Pain in the chest 05/17/2016  . Peripheral arterial disease ([place]), s/p PTA x 2 RLE    nonhealing ulcers bilaterally on each great toe  . Pneumonia   . PONV (postoperative nausea and vomiting)   . Pulmonary nodule  05/17/2016  . Restless leg syndrome   . Shortness of breath   . Stroke [facility]) March 08, 2012  . Toe osteomyelitis, left ([place])   . Ulcer of great toe, left, with necrosis of bone ([place])   . Wears dentures     Past Surgical History:  Procedure Laterality Date  . AMPUTATION TOE Left 07/25/2018   Procedure: AMPUTATION TOE INTERPHALANGEAL HALLUX LEFT;  Surgeon: Evelina Bucy, DPM;  Location: [place];  Service: Podiatry;  Laterality: Left;  . ANGIOPLASTY  02/28/14   diamond back orbital rotational atherectomy of Rt. tibial  . BACK SURGERY    . BONE BIOPSY Left 07/25/2018   Procedure: SUPERFICIAL BONE BIOPSY;  Surgeon: Evelina Bucy, DPM;  Location: [place];  Service: Podiatry;  Laterality: Left;  . CARDIAC CATHETERIZATION N/A 05/19/2016   Procedure: Right/Left Heart Cath and Coronary Angiography;  Surgeon: Belva Crome, MD;  Location: [place] CV LAB;  Service: Cardiovascular;  Laterality: N/A;  . CERVICAL FUSION    . ENDARTERECTOMY Left 11/29/2013   Procedure: ENDARTERECTOMY CAROTID;  Surgeon: Serafina Mitchell, MD;  Location: [place];  Service: Vascular;  Laterality: Left;  . ESOPHAGOGASTRODUODENOSCOPY  02/2010   Dr. Gala Romney: patient presented with food impaction, schatzki ring with superimposed component of stricture  with erosive reflux esophagitis, s/p disimpaction but dilation planned at later date   . ESOPHAGOGASTRODUODENOSCOPY (EGD) WITH PROPOFOL N/A 08/13/2019   Dr. Gala Romney: Erosive reflux esophagitis with mild stricture and incidental Mallory-Weiss tear which precluded esophageal dilation.  Medium sized hiatal hernia.  [name] ESOPHAGOGASTRODUODENOSCOPY (EGD) WITH PROPOFOL N/A 10/11/2019   Dr. Gala Romney: Esophageal stenosis status post dilation, moderate hiatal hernia  . EYE SURGERY    . FLEXIBLE SIGMOIDOSCOPY N/A 08/13/2019   Procedure: FLEXIBLE SIGMOIDOSCOPY;  Surgeon: [name], MD;  [facility];  Service: Endoscopy;  Laterality: N/A;  colonoscopy aboerted due to formed stool and  poor prep  . FOOT SURGERY    . KNEE ARTHROSCOPY WITH MEDIAL MENISECTOMY Left 05/06/2020   Procedure: KNEE ARTHROSCOPY WITH MEDIAL MENISCECTOMY AND LATERAL MENISCECTOMY;  Surgeon: [name], MD;  [facility];  Service: Orthopedics;  Laterality: Left;  . Lower ext duplex doppler  03/14/14   Rt ABI 1.2  . LOWER EXTREMITY ANGIOGRAM Bilateral 02/18/2014   Procedure: LOWER EXTREMITY ANGIOGRAM;  Surgeon: [name], MD;  [facility]: [facility];  Service: Cardiovascular;  Laterality: Bilateral;  . LOWER EXTREMITY ANGIOGRAM N/A 10/31/2014   Procedure: LOWER EXTREMITY ANGIOGRAM;  Surgeon: [name], MD;  Location: [facility] CATH LAB;  Service: Cardiovascular;  Laterality: N/A;  . Venia Minks DILATION N/A 10/11/2019   Procedure: Venia Minks DILATION;  Surgeon: [name], MD;  [facility];  Service: Endoscopy;  Laterality: N/A;  . MULTIPLE TOOTH EXTRACTIONS    . PV angiogram  02/18/2014   tibial vessel diseas bil.  [name] SPINE SURGERY    . tendon achillies lengthing and sesamoid      Vitals:   01/23/21 0001  BP: 128/62  Pulse: 70      Subjective Assessment - 01/23/21 1404    Subjective MD apt yesterday and BP at 96/40 mmHg.  No reoprts of pain or recent falls.  Stated in new shoes he can't slide, has to pick feet up.    Pertinent History CHF , R foot wound, left knee scope, DB, Restless leg syndrome, hx of stroke.    Patient Stated Goals be able to walk normally and function correctly.    Currently in Pain? No/denies                     Wound Therapy - 01/23/21 0001    Subjective MD apt yesterday and BP at 96/40 mmHg.  No reoprts of pain or recent falls.  Stated in new shoes he can't slide, has to pick feet up.    Patient and Family Stated Goals to have wound to heal and to be able to walk    Date of Onset --   4-5 years ago   Prior Treatments wound clinics, neosporin on bandaid but bandaid wound stick.    Pain Scale 0-10    Pain Score 0-No pain    Evaluation  and Treatment Procedures Explained to Patient/Family Yes    Evaluation and Treatment Procedures agreed to    Wound Properties Date First Assessed: 10/20/20 Time First Assessed: 1000 Wound Type: Diabetic ulcer Location: Foot Location Orientation: Anterior;Right Wound Description (Comments): bottom of foot Present on Admission: Yes   Wound Image View All Images View Images    Dressing Type Impregnated gauze (bismuth);Gauze (Comment)   medihoney, xeroform, 2x2, medipore tape   Dressing Changed Changed    Dressing Status Old drainage    Dressing Change Frequency PRN    Site /  Wound Assessment Pink;Red;Clean    % Wound base Red or Granulating 100%    % Wound base Yellow/Fibrinous Exudate 0%    Wound Length (cm) 0.5 cm   was .8   Wound Width (cm) 0.5 cm   was .8   Wound Depth (cm) 0.2 cm   was .2   Wound Volume (cm^3) 0.05 cm^3    Wound Surface Area (cm^2) 0.25 cm^2    Margins Epibole (rolled edges)    Drainage Amount Minimal    Drainage Description Serosanguineous    Treatment Cleansed;Debridement (Selective)    Selective Debridement - Location edges and calloused perimeter; expect wound to get larger with debridement    Selective Debridement - Tools Used Forceps;Scalpel    Selective Debridement - Tissue Removed callous devitalized tissue.    Wound Therapy - Clinical Statement Increased callous formation around wound as pt not seen since last week.  Selective debridement to remove callous perimeter.  Increased approximation noted with wound measurements.  Selective debridement to address epidole edges from 12-5 o'clock.  Continued wiht medihoney, xeroform, 2x2 and medipore tape.    Wound Therapy - Functional Problem List difficulty walking, washing, dressing    Factors Delaying/Impairing Wound Healing Diabetes Mellitus;Immobility    Wound Therapy - Frequency 2X / week    Wound Therapy - Current Recommendations PT    Wound Plan Continue wound care with appropriate dressings.    Dressing   medihoney wound bed, xeroform on top, vaseline perimeter, 2x2 and medipore tape.                     PT Short Term Goals - 01/13/21 1123      PT SHORT TERM GOAL #1   Title Patient will be independent in self management strategies to improve quality of life and functional outcomes.    Baseline performs 1-2x/day    Status Achieved      PT SHORT TERM GOAL #2   Title Patient will be able to report at least 25% improvement in overall mobility.    Baseline feels 100% better    Status Achieved      PT SHORT TERM GOAL #3   Title Patient's wound will be reduced in volume by at least 25% to demonstrate improved wound healing    Baseline was 2.55m^3, now .13 m^3    Status Achieved             PT Long Term Goals - 01/13/21 1130      PT LONG TERM GOAL #1   Title Patient will be able to ambulate at least 100 feet with RW in 2 minutes to demonstrate improved ambulatory endurance    Baseline 11/19/20:  2MWT 137ft with rollator, improved foot clearance followng cueing from therapist    Status Achieved      PT LONG TERM GOAL #2   Title Patient's wound will reduce in overall volume byt at least 50% to demonstrate improved wound healing    Baseline was 2.10m^3 and now .74 m ^3    Status Achieved      PT LONG TERM GOAL #3   Title Patient will be able to demonstate 5-110 degrees in left knee to demonstrate improved knee mobility    Baseline 11/19/20: Lt 18-123 degrees, Rt 5-120 degrees    Status On-going      PT LONG TERM GOAL #4   Title Wound will be completely healed to reduce risk of infection    Time 4  Period Weeks    Status New    Target Date 02/10/21      PT LONG TERM GOAL #5   Title Patient will be able to perform 5x sit to stand without use of arms in < 14.5 seconds to demonstrate improved functional strength    Time 4    Period Weeks    Status New    Target Date 02/10/21                  Patient will benefit from skilled therapeutic intervention in order  to improve the following deficits and impairments:     Visit Diagnosis: Chronic pain of left knee  Diabetic ulcer of right midfoot associated with diabetes mellitus due to underlying condition, limited to breakdown of skin ([place])  Difficulty in walking, not elsewhere classified     Problem List Patient Active Problem List   Diagnosis Date Noted  . Erectile dysfunction 01/22/2021  . Gait abnormality 09/30/2020  . Mild non proliferative diabetic retinopathy ([place]) 07/16/2020  . Need for immunization against influenza 06/03/2020  . S/P left knee arthroscopy 05/06/20 05/13/2020  . Abnormal MRI 03/26/2020  . Joint disorder of knee 03/26/2020  . Fall at home, initial encounter 03/13/2020  . Anemia 09/03/2019  . Reflux esophagitis 09/03/2019  . Esophageal dysphagia 05/30/2019  . Constipation 05/30/2019  . Positive colorectal cancer screening using Cologuard test 05/30/2019  . Diabetic foot ulcer ([place]) 09/06/2018  . Congestive heart failure ([place]) 05/17/2016  . Bilateral carotid artery disease ([place]) 07/08/2014  . Obstructive sleep apnea 02/12/2014  . Restless leg syndrome 10/23/2013  . Overweight with body mass index (BMI) of 29 to 29.9 in adult 10/23/2013  . Hyperlipidemia 07/16/2013  . Peripheral arterial disease ([place]) 07/16/2013  . Hypertension 03/14/2012  . Type 2 diabetes mellitus with circulatory disorder ([place]) 03/14/2012  . Peripheral neuropathy 03/14/2012  . Cardiomyopathy- EF NL 01/2013, now 30-35% echo 03/14/2012   [name], LPTA/CLT; CBIS [phone]  [name] 01/23/2021, 5:55 PM  [place], [place], [address] Phone: [phone]   Fax:  [phone]  Name: [name] MRN: [id number] Date of Birth: [birth year]

## 2021-01-26 DIAGNOSIS — I10 Essential (primary) hypertension: Secondary | ICD-10-CM | POA: Diagnosis not present

## 2021-01-26 DIAGNOSIS — E039 Hypothyroidism, unspecified: Secondary | ICD-10-CM | POA: Diagnosis not present

## 2021-01-26 DIAGNOSIS — E1159 Type 2 diabetes mellitus with other circulatory complications: Secondary | ICD-10-CM | POA: Diagnosis not present

## 2021-01-27 ENCOUNTER — Other Ambulatory Visit: Payer: Self-pay | Admitting: Nurse Practitioner

## 2021-01-27 ENCOUNTER — Ambulatory Visit (HOSPITAL_COMMUNITY): Payer: HMO | Admitting: Physical Therapy

## 2021-01-27 ENCOUNTER — Other Ambulatory Visit: Payer: Self-pay

## 2021-01-27 DIAGNOSIS — N529 Male erectile dysfunction, unspecified: Secondary | ICD-10-CM

## 2021-01-27 DIAGNOSIS — E039 Hypothyroidism, unspecified: Secondary | ICD-10-CM

## 2021-01-27 MED ORDER — LEVOTHYROXINE SODIUM 88 MCG PO TABS: 88.0000 ug | ORAL_TABLET | Freq: Every day | ORAL | 3 refills | Status: AC

## 2021-01-27 NOTE — Progress Notes (Signed)
A1c is 9.6% which is very high. I Ill get you in with endocrinology to help get your blood sugar down.  TSH was a little elevated, so I increased levothyroxine to 88 mcg/day. We will recheck thyroid labs in 1 month.  RBCs are stable from previous labs.

## 2021-01-28 LAB — CMP14+EGFR
ALT: 10 IU/L (ref 0–44)
AST: 11 IU/L (ref 0–40)
Albumin/Globulin Ratio: 1.8 (ref 1.2–2.2)
Albumin: 4.3 g/dL (ref 3.7–4.7)
Alkaline Phosphatase: 82 IU/L (ref 44–121)
BUN/Creatinine Ratio: 18 (ref 10–24)
BUN: 15 mg/dL (ref 8–27)
Bilirubin Total: 0.6 mg/dL (ref 0.0–1.2)
CO2: 28 mmol/L (ref 20–29)
Calcium: 8.9 mg/dL (ref 8.6–10.2)
Chloride: 96 mmol/L (ref 96–106)
Creatinine, Ser: 0.85 mg/dL (ref 0.76–1.27)
Globulin, Total: 2.4 g/dL (ref 1.5–4.5)
Glucose: 190 mg/dL — ABNORMAL HIGH (ref 65–99)
Potassium: 3.5 mmol/L (ref 3.5–5.2)
Sodium: 139 mmol/L (ref 134–144)
Total Protein: 6.7 g/dL (ref 6.0–8.5)
eGFR: 89 mL/min/{1.73_m2} (ref >59)

## 2021-01-28 LAB — LIPID PANEL WITH LDL/HDL RATIO
Cholesterol, Total: 149 mg/dL (ref 100–199)
HDL: 46 mg/dL (ref >39)
LDL Chol Calc (NIH): 88 mg/dL (ref 0–99)
LDL/HDL Ratio: 1.9 ratio (ref 0.0–3.6)
Triglycerides: 78 mg/dL (ref 0–149)
VLDL Cholesterol Cal: 15 mg/dL (ref 5–40)

## 2021-01-28 LAB — CBC WITH DIFFERENTIAL/PLATELET
Basophils Absolute: 0 10*3/uL (ref 0.0–0.2)
Basos: 1 %
EOS (ABSOLUTE): 0.1 10*3/uL (ref 0.0–0.4)
Eos: 2 %
Hematocrit: 33.6 % — ABNORMAL LOW (ref 37.5–51.0)
Hemoglobin: 11.4 g/dL — ABNORMAL LOW (ref 13.0–17.7)
Immature Grans (Abs): 0 10*3/uL (ref 0.0–0.1)
Immature Granulocytes: 0 %
Lymphocytes Absolute: 1.2 10*3/uL (ref 0.7–3.1)
Lymphs: 28 %
MCH: 33.2 pg — ABNORMAL HIGH (ref 26.6–33.0)
MCHC: 33.9 g/dL (ref 31.5–35.7)
MCV: 98 fL — ABNORMAL HIGH (ref 79–97)
Monocytes Absolute: 0.4 10*3/uL (ref 0.1–0.9)
Monocytes: 8 %
Neutrophils Absolute: 2.7 10*3/uL (ref 1.4–7.0)
Neutrophils: 61 %
Platelets: 196 10*3/uL (ref 150–450)
RBC: 3.43 x10E6/uL — ABNORMAL LOW (ref 4.14–5.80)
RDW: 14.5 % (ref 11.6–15.4)
WBC: 4.4 10*3/uL (ref 3.4–10.8)

## 2021-01-28 LAB — MICROALBUMIN / CREATININE URINE RATIO
Creatinine, Urine: 198.2 mg/dL
Microalb/Creat Ratio: 14 mg/g creat (ref 0–29)
Microalbumin, Urine: 27.1 ug/mL

## 2021-01-28 LAB — TSH+FREE T4
Free T4: 1.33 ng/dL (ref 0.82–1.77)
TSH: 6.11 u[IU]/mL — ABNORMAL HIGH (ref 0.450–4.500)

## 2021-01-28 LAB — HEMOGLOBIN A1C
Est. average glucose Bld gHb Est-mCnc: 229 mg/dL
Hgb A1c MFr Bld: 9.6 % — ABNORMAL HIGH (ref 4.8–5.6)

## 2021-01-29 ENCOUNTER — Other Ambulatory Visit: Payer: Self-pay

## 2021-01-29 ENCOUNTER — Ambulatory Visit (HOSPITAL_COMMUNITY): Payer: HMO | Admitting: Physical Therapy

## 2021-01-29 DIAGNOSIS — M25562 Pain in left knee: Secondary | ICD-10-CM | POA: Diagnosis not present

## 2021-01-29 DIAGNOSIS — G8929 Other chronic pain: Secondary | ICD-10-CM

## 2021-01-29 DIAGNOSIS — R262 Difficulty in walking, not elsewhere classified: Secondary | ICD-10-CM

## 2021-01-29 DIAGNOSIS — E08621 Diabetes mellitus due to underlying condition with foot ulcer: Secondary | ICD-10-CM

## 2021-01-29 DIAGNOSIS — L97411 Non-pressure chronic ulcer of right heel and midfoot limited to breakdown of skin: Secondary | ICD-10-CM

## 2021-01-29 NOTE — Therapy (Signed)
[place], [place], [address] Phone: [phone]   Fax:  [phone]  Wound Care Therapy  Patient Details  Name: [name] MRN: [id number] Date of Birth: 11-Oct-1945 Referring Provider (PT): Perlie Mayo   Encounter Date: 01/29/2021   PT End of Session - 01/29/21 1313    Visit Number 25    Number of Visits 30    Date for PT Re-Evaluation 02/10/21    Authorization Type [employer], no VL, no auth , $15 co pay    Progress Note Due on Visit 32    PT Start Time 1315    PT Stop Time 1409    PT Time Calculation (min) 54 min    Equipment Utilized During Treatment Gait belt    Activity Tolerance Patient tolerated treatment well;Patient limited by fatigue    Behavior During Therapy [facility] for tasks assessed/performed           Past Medical History:  Diagnosis Date  . Aftercare following surgery of the circulatory system, [place] 12/04/2013  . Angina decubitus ([place]) 05/19/2016  . Arrhythmia 05/20/2016  . Arthritis   . ARTHRITIS, RIGHT FOOT 06/26/2008   Qualifier: Diagnosis of  By: Aline Brochure MD, Dorothyann Peng    . Cardiomyopathy- EF NL 01/2013, now 30-35% echo 03/14/2012  . Carotid artery occlusion    left s/p CEA  . Cellulitis of left foot 05/07/2018  . CHF (congestive heart failure) ([place])   . Critical lower limb ischemia (HCC) 02/28/2014   Critical limb ischemia   . Diabetes mellitus   . Elevated troponin 04/11/2016  . GERD (gastroesophageal reflux disease)   . History of stroke June 2013 03/14/2012  . HOH (hard of hearing)   . Hypertension   . Hypothyroidism   . Nonhealing skin ulcer ([place]) 10/28/2014  . Obstructive sleep apnea   . Orthostatic hypotension   . Osteomyelitis ([place])    left great toe  . Pain in the chest 05/17/2016  . Peripheral arterial disease ([place]), s/p PTA x 2 RLE    nonhealing ulcers bilaterally on each great toe  . Pneumonia   . PONV (postoperative nausea and vomiting)   . Pulmonary nodule  05/17/2016  . Restless leg syndrome   . Shortness of breath   . Stroke [facility]) March 08, 2012  . Toe osteomyelitis, left ([place])   . Ulcer of great toe, left, with necrosis of bone ([place])   . Wears dentures     Past Surgical History:  Procedure Laterality Date  . AMPUTATION TOE Left 07/25/2018   Procedure: AMPUTATION TOE INTERPHALANGEAL HALLUX LEFT;  Surgeon: Evelina Bucy, DPM;  Location: [place];  Service: Podiatry;  Laterality: Left;  . ANGIOPLASTY  02/28/14   diamond back orbital rotational atherectomy of Rt. tibial  . BACK SURGERY    . BONE BIOPSY Left 07/25/2018   Procedure: SUPERFICIAL BONE BIOPSY;  Surgeon: Evelina Bucy, DPM;  Location: [place];  Service: Podiatry;  Laterality: Left;  . CARDIAC CATHETERIZATION N/A 05/19/2016   Procedure: Right/Left Heart Cath and Coronary Angiography;  Surgeon: Belva Crome, MD;  Location: [place] CV LAB;  Service: Cardiovascular;  Laterality: N/A;  . CERVICAL FUSION    . ENDARTERECTOMY Left 11/29/2013   Procedure: ENDARTERECTOMY CAROTID;  Surgeon: Serafina Mitchell, MD;  Location: [place];  Service: Vascular;  Laterality: Left;  . ESOPHAGOGASTRODUODENOSCOPY  02/2010   Dr. Gala Romney: patient presented with food impaction, schatzki ring with superimposed component of stricture  with erosive reflux esophagitis, s/p disimpaction but dilation planned at later date   . ESOPHAGOGASTRODUODENOSCOPY (EGD) WITH PROPOFOL N/A 08/13/2019   Dr. Gala Romney: Erosive reflux esophagitis with mild stricture and incidental Mallory-Weiss tear which precluded esophageal dilation.  Medium sized hiatal hernia.  [name] ESOPHAGOGASTRODUODENOSCOPY (EGD) WITH PROPOFOL N/A 10/11/2019   Dr. Gala Romney: Esophageal stenosis status post dilation, moderate hiatal hernia  . EYE SURGERY    . FLEXIBLE SIGMOIDOSCOPY N/A 08/13/2019   Procedure: FLEXIBLE SIGMOIDOSCOPY;  Surgeon: [name], MD;  [facility];  Service: Endoscopy;  Laterality: N/A;  colonoscopy aboerted due to formed stool and  poor prep  . FOOT SURGERY    . KNEE ARTHROSCOPY WITH MEDIAL MENISECTOMY Left 05/06/2020   Procedure: KNEE ARTHROSCOPY WITH MEDIAL MENISCECTOMY AND LATERAL MENISCECTOMY;  Surgeon: [name], MD;  [facility];  Service: Orthopedics;  Laterality: Left;  . Lower ext duplex doppler  03/14/14   Rt ABI 1.2  . LOWER EXTREMITY ANGIOGRAM Bilateral 02/18/2014   Procedure: LOWER EXTREMITY ANGIOGRAM;  Surgeon: [name], MD;  Location: [facility] CATH LAB;  Service: Cardiovascular;  Laterality: Bilateral;  . LOWER EXTREMITY ANGIOGRAM N/A 10/31/2014   Procedure: LOWER EXTREMITY ANGIOGRAM;  Surgeon: [name], MD;  Location: [facility] CATH LAB;  Service: Cardiovascular;  Laterality: N/A;  . Venia Minks DILATION N/A 10/11/2019   Procedure: Venia Minks DILATION;  Surgeon: [name], MD;  [facility];  Service: Endoscopy;  Laterality: N/A;  . MULTIPLE TOOTH EXTRACTIONS    . PV angiogram  02/18/2014   tibial vessel diseas bil.  [name] SPINE SURGERY    . tendon achillies lengthing and sesamoid      There were no vitals filed for this visit.      [facility] PT Assessment - 01/29/21 0001      Assessment   Medical Diagnosis R foot ulcer and gait instability                   Wound Therapy - 01/29/21 0001    Subjective States he has been a little tired and light headed. Reports no pain    Patient and Family Stated Goals to have wound to heal and to be able to walk    Date of Onset --   4-5 years ago   Prior Treatments wound clinics, neosporin on bandaid but bandaid wound stick.    Pain Scale 0-10    Pain Score 0-No pain    Evaluation and Treatment Procedures Explained to Patient/Family Yes    Evaluation and Treatment Procedures agreed to    Wound Properties Date First Assessed: 10/20/20 Time First Assessed: 1000 Wound Type: Diabetic ulcer Location: Foot Location Orientation: Anterior;Right Wound Description (Comments): bottom of foot Present on Admission: Yes   Dressing Type Gauze  (Comment);Impregnated gauze (bismuth)   medihoney   Dressing Changed Changed    Dressing Status Old drainage    Dressing Change Frequency PRN    Site / Wound Assessment Pink;Clean;Red    % Wound base Red or Granulating 100%    % Wound base Yellow/Fibrinous Exudate 0%    Margins Unattached edges (unapproximated)    Drainage Amount Minimal    Drainage Description Serosanguineous    Treatment Cleansed;Debridement (Selective)    Selective Debridement - Location edges and calloused perimeter; expect wound to get larger with debridement    Selective Debridement - Tools Used Forceps;Scalpel;Scissors    Selective Debridement - Tissue Removed callous devitalized tissue.    Wound Therapy -  Clinical Statement Continued with debridement of callus and devitalized tissue. Scoured wound edges to assist with opening them up. Continued with medihoney, xeroform and 2x2. Monitored blood pressure during session. Supine BP was 104/5p, sitting edge of bed BP was 107/57 and standing BP was 87/47. Continued to report symptoms of fatigue and dizziness/fogginess. Withheld exercises secondary to continued low blood pressure and poor response to standing and transitional movements. Symptoms improved to baseline fogginess by end of session.    Wound Therapy - Functional Problem List difficulty walking, washing, dressing    Factors Delaying/Impairing Wound Healing Diabetes Mellitus;Immobility    Wound Therapy - Frequency 2X / week    Wound Therapy - Current Recommendations PT    Wound Plan Continue wound care with appropriate dressings.    Dressing  medihoney wound bed, xeroform on top, vaseline perimeter, 2x2 and medipore tape.                     PT Short Term Goals - 01/13/21 1123      PT SHORT TERM GOAL #1   Title Patient will be independent in self management strategies to improve quality of life and functional outcomes.    Baseline performs 1-2x/day    Status Achieved      PT SHORT TERM GOAL #2    Title Patient will be able to report at least 25% improvement in overall mobility.    Baseline feels 100% better    Status Achieved      PT SHORT TERM GOAL #3   Title Patient's wound will be reduced in volume by at least 25% to demonstrate improved wound healing    Baseline was 2.58m^3, now .13 m^3    Status Achieved             PT Long Term Goals - 01/13/21 1130      PT LONG TERM GOAL #1   Title Patient will be able to ambulate at least 100 feet with RW in 2 minutes to demonstrate improved ambulatory endurance    Baseline 11/19/20:  2MWT 12ft with rollator, improved foot clearance followng cueing from therapist    Status Achieved      PT LONG TERM GOAL #2   Title Patient's wound will reduce in overall volume byt at least 50% to demonstrate improved wound healing    Baseline was 2.40m^3 and now .71 m ^3    Status Achieved      PT LONG TERM GOAL #3   Title Patient will be able to demonstate 5-110 degrees in left knee to demonstrate improved knee mobility    Baseline 11/19/20: Lt 18-123 degrees, Rt 5-120 degrees    Status On-going      PT LONG TERM GOAL #4   Title Wound will be completely healed to reduce risk of infection    Time 4    Period Weeks    Status New    Target Date 02/10/21      PT LONG TERM GOAL #5   Title Patient will be able to perform 5x sit to stand without use of arms in < 14.5 seconds to demonstrate improved functional strength    Time 4    Period Weeks    Status New    Target Date 02/10/21                  Patient will benefit from skilled therapeutic intervention in order to improve the following deficits and impairments:     Visit  Diagnosis: Chronic pain of left knee  Diabetic ulcer of right midfoot associated with diabetes mellitus due to underlying condition, limited to breakdown of skin ([place])  Difficulty in walking, not elsewhere classified     Problem List Patient Active Problem List   Diagnosis Date Noted  . Erectile  dysfunction 01/22/2021  . Gait abnormality 09/30/2020  . Mild non proliferative diabetic retinopathy ([place]) 07/16/2020  . Need for immunization against influenza 06/03/2020  . S/P left knee arthroscopy 05/06/20 05/13/2020  . Abnormal MRI 03/26/2020  . Joint disorder of knee 03/26/2020  . Fall at home, initial encounter 03/13/2020  . Anemia 09/03/2019  . Reflux esophagitis 09/03/2019  . Esophageal dysphagia 05/30/2019  . Constipation 05/30/2019  . Positive colorectal cancer screening using Cologuard test 05/30/2019  . Diabetic foot ulcer ([place]) 09/06/2018  . Congestive heart failure ([place]) 05/17/2016  . Bilateral carotid artery disease ([place]) 07/08/2014  . Obstructive sleep apnea 02/12/2014  . Restless leg syndrome 10/23/2013  . Overweight with body mass index (BMI) of 29 to 29.9 in adult 10/23/2013  . Hyperlipidemia 07/16/2013  . Peripheral arterial disease ([place]) 07/16/2013  . Hypertension 03/14/2012  . Type 2 diabetes mellitus with circulatory disorder ([place]) 03/14/2012  . Peripheral neuropathy 03/14/2012  . Cardiomyopathy- EF NL 01/2013, now 30-35% echo 03/14/2012    2:13 PM, 01/29/21 [name], DPT Physical Therapy with [facility]  [phone] office  [place] [address] [place], [place], [address] Phone: [phone]   Fax:  [phone]  Name: [name] MRN: [id number] Date of Birth: June 09, 1946

## 2021-01-30 ENCOUNTER — Ambulatory Visit: Payer: HMO | Admitting: Podiatry

## 2021-01-30 ENCOUNTER — Encounter: Payer: Self-pay | Admitting: Podiatry

## 2021-01-30 DIAGNOSIS — E1142 Type 2 diabetes mellitus with diabetic polyneuropathy: Secondary | ICD-10-CM | POA: Diagnosis not present

## 2021-01-30 DIAGNOSIS — Z89412 Acquired absence of left great toe: Secondary | ICD-10-CM

## 2021-01-30 DIAGNOSIS — E1169 Type 2 diabetes mellitus with other specified complication: Secondary | ICD-10-CM

## 2021-01-30 DIAGNOSIS — B351 Tinea unguium: Secondary | ICD-10-CM

## 2021-01-30 DIAGNOSIS — E1159 Type 2 diabetes mellitus with other circulatory complications: Secondary | ICD-10-CM

## 2021-02-02 ENCOUNTER — Telehealth (INDEPENDENT_AMBULATORY_CARE_PROVIDER_SITE_OTHER): Payer: HMO

## 2021-02-02 ENCOUNTER — Other Ambulatory Visit: Payer: Self-pay

## 2021-02-02 ENCOUNTER — Telehealth: Payer: HMO

## 2021-02-02 DIAGNOSIS — Z0000 Encounter for general adult medical examination without abnormal findings: Secondary | ICD-10-CM

## 2021-02-02 NOTE — Progress Notes (Signed)
Subjective:   [name] is a 75 y.o. male who presents for an Initial Medicare Annual Wellness Visit.  Review of Systems     Cardiac Risk Factors include: advanced age (>48men, >18 women);diabetes mellitus;dyslipidemia;hypertension;male gender;sedentary lifestyle     Objective:    Today's Vitals   02/02/21 1525  PainSc: 10-Worst pain ever   There is no height or weight on file to calculate BMI.  Advanced Directives 12/12/2020 10/20/2020 05/01/2020 10/11/2019 10/09/2019 08/13/2019 08/09/2019  Does Patient Have a Medical Advance Directive? No No No No No No No  Type of Advance Directive - - - - - - -  Copy of Healthcare Power of Attorney in Chart? - - - - - - -  Would patient like information on creating a medical advance directive? - No - Patient declined No - Patient declined No - Patient declined No - Patient declined No - Patient declined No - Patient declined  Pre-existing out of facility DNR order (yellow form or pink MOST form) - - - - - - -    Current Medications (verified) Outpatient Encounter Medications as of 02/02/2021  Medication Sig  . aspirin EC 81 MG EC tablet Take 1 tablet (81 mg total) by mouth daily. (Patient taking differently: Take 81 mg by mouth at bedtime.)  . b complex vitamins tablet Take 1 tablet by mouth daily.  . carvedilol (COREG) 3.125 MG tablet Take 1 tablet (3.125 mg total) by mouth 2 (two) times daily with a meal.  . Cholecalciferol (VITAMIN D3) 5000 units TABS Take 5,000 Units by mouth 2 (two) times daily.   . clopidogrel (PLAVIX) 75 MG tablet TAKE ONE (1) TABLET BY MOUTH EVERY DAY  . Continuous Blood Gluc Receiver (FREESTYLE LIBRE 14 DAY READER) DEVI   . Continuous Blood Gluc Sensor (FREESTYLE LIBRE 14 DAY SENSOR) MISC Inject 1 each into the skin every 14 (fourteen) days.  . Cyanocobalamin 1500 MCG TBDP Take 1,500 mcg by mouth daily.   . DULoxetine (CYMBALTA) 60 MG capsule Take 1 capsule (60 mg total) by mouth daily.  . empagliflozin  (JARDIANCE) 25 MG TABS tablet Take 1 tablet (25 mg total) by mouth daily before breakfast.  . furosemide (LASIX) 40 MG tablet TAKE ONE TABLET (40MG  TOTAL) BY MOUTH TWO TIMES DAILY.  [name] glimepiride (AMARYL) 4 MG tablet Take 1 tablet (4 mg total) by mouth daily with breakfast.  . levothyroxine (SYNTHROID) 88 MCG tablet Take 1 tablet (88 mcg total) by mouth daily.  [name] lidocaine (LIDODERM) 5 % Place 1 patch onto the skin daily. Remove & Discard patch within 12 hours or as directed by MD  . linaclotide (LINZESS) 290 MCG CAPS capsule Take 290 mcg by mouth daily before breakfast.  . Multiple Vitamin (MULTIVITAMIN WITH MINERALS) TABS tablet Take 1 tablet by mouth daily.  . nitroGLYCERIN (NITROSTAT) 0.4 MG SL tablet Place 1 tablet (0.4 mg total) under the tongue every 5 (five) minutes as needed for chest pain.  . pantoprazole (PROTONIX) 40 MG tablet TAKE ONE (1) TABLET BY MOUTH EVERY DAY  . pramipexole (MIRAPEX) 0.25 MG tablet Take 2 tablets (0.5 mg total) by mouth 2 (two) times daily. Take 0.5mg  by mouth at 7 PM and 0.5mg  at 9 PM daily.  . rosuvastatin (CRESTOR) 20 MG tablet Take 1 tablet (20 mg total) by mouth daily.  [name] UNABLE TO FIND Home health supplies for CPAP  . UNABLE TO FIND [place] sock aid to help with support hose. Size XL.  Dx:I73.9, G25.81, R60.9  .  potassium chloride (KLOR-CON) 10 MEQ tablet Take 1 tablet (10 mEq total) by mouth daily. Please schedule appointment with Dr. Gwenlyn Found   No facility-administered encounter medications on file as of 02/02/2021.    Allergies (verified) Codeine and Tramadol   History: Past Medical History:  Diagnosis Date  . Aftercare following surgery of the circulatory system, [place] 12/04/2013  . Angina decubitus ([place]) 05/19/2016  . Arrhythmia 05/20/2016  . Arthritis   . ARTHRITIS, RIGHT FOOT 06/26/2008   Qualifier: Diagnosis of  By: Aline Brochure MD, Dorothyann Peng    . Cardiomyopathy- EF NL 01/2013, now 30-35% echo 03/14/2012  . Carotid artery occlusion    left s/p CEA  .  Cellulitis of left foot 05/07/2018  . CHF (congestive heart failure) ([place])   . Critical lower limb ischemia (HCC) 02/28/2014   Critical limb ischemia   . Diabetes mellitus   . Elevated troponin 04/11/2016  . GERD (gastroesophageal reflux disease)   . History of stroke June 2013 03/14/2012  . HOH (hard of hearing)   . Hypertension   . Hypothyroidism   . Nonhealing skin ulcer ([place]) 10/28/2014  . Obstructive sleep apnea   . Orthostatic hypotension   . Osteomyelitis ([place])    left great toe  . Pain in the chest 05/17/2016  . Peripheral arterial disease ([place]), s/p PTA x 2 RLE    nonhealing ulcers bilaterally on each great toe  . Pneumonia   . PONV (postoperative nausea and vomiting)   . Pulmonary nodule 05/17/2016  . Restless leg syndrome   . Shortness of breath   . Stroke [facility]) March 08, 2012  . Toe osteomyelitis, left ([place])   . Ulcer of great toe, left, with necrosis of bone ([place])   . Wears dentures    Past Surgical History:  Procedure Laterality Date  . AMPUTATION TOE Left 07/25/2018   Procedure: AMPUTATION TOE INTERPHALANGEAL HALLUX LEFT;  Surgeon: Evelina Bucy, DPM;  Location: [facility];  Service: Podiatry;  Laterality: Left;  . ANGIOPLASTY  02/28/14   diamond back orbital rotational atherectomy of Rt. tibial  . BACK SURGERY    . BONE BIOPSY Left 07/25/2018   Procedure: SUPERFICIAL BONE BIOPSY;  Surgeon: Evelina Bucy, DPM;  Location: [place];  Service: Podiatry;  Laterality: Left;  . CARDIAC CATHETERIZATION N/A 05/19/2016   Procedure: Right/Left Heart Cath and Coronary Angiography;  Surgeon: Belva Crome, MD;  Location: [place];  Service: Cardiovascular;  Laterality: N/A;  . CERVICAL FUSION    . ENDARTERECTOMY Left 11/29/2013   Procedure: ENDARTERECTOMY CAROTID;  Surgeon: Serafina Mitchell, MD;  Location: [facility] OR;  Service: Vascular;  Laterality: Left;  . ESOPHAGOGASTRODUODENOSCOPY  02/2010   Dr. Gala Romney: patient presented with food impaction, schatzki ring with superimposed  component of stricture with erosive reflux esophagitis, s/p disimpaction but dilation planned at later date   . ESOPHAGOGASTRODUODENOSCOPY (EGD) WITH PROPOFOL N/A 08/13/2019   Dr. Gala Romney: Erosive reflux esophagitis with mild stricture and incidental Mallory-Weiss tear which precluded esophageal dilation.  Medium sized hiatal hernia.  [name] ESOPHAGOGASTRODUODENOSCOPY (EGD) WITH PROPOFOL N/A 10/11/2019   Dr. Gala Romney: Esophageal stenosis status post dilation, moderate hiatal hernia  . EYE SURGERY    . FLEXIBLE SIGMOIDOSCOPY N/A 08/13/2019   Procedure: FLEXIBLE SIGMOIDOSCOPY;  Surgeon: [name], MD;  [facility];  Service: Endoscopy;  Laterality: N/A;  colonoscopy aboerted due to formed stool and poor prep  . FOOT SURGERY    . KNEE ARTHROSCOPY WITH MEDIAL MENISECTOMY Left 05/06/2020  Procedure: KNEE ARTHROSCOPY WITH MEDIAL MENISCECTOMY AND LATERAL MENISCECTOMY;  Surgeon: [name], MD;  Location: [facility];  Service: Orthopedics;  Laterality: Left;  . Lower ext duplex doppler  03/14/14   Rt ABI 1.2  . LOWER EXTREMITY ANGIOGRAM Bilateral 02/18/2014   Procedure: LOWER EXTREMITY ANGIOGRAM;  Surgeon: [name], MD;  [facility] C[facility] LAB;  Service: Cardiovascular;  Laterality: Bilateral;  . LOWER EXTREMITY ANGIOGRAM N/A 10/31/2014   Procedure: LOWER EXTREMITY ANGIOGRAM;  Surgeon: [name], MD;  Location: [facility] CATH LAB;  Service: Cardiovascular;  Laterality: N/A;  . Venia Minks DILATION N/A 10/11/2019   Procedure: Venia Minks DILATION;  Surgeon: [name], MD;  [facility];  Service: Endoscopy;  Laterality: N/A;  . MULTIPLE TOOTH EXTRACTIONS    . PV angiogram  02/18/2014   tibial vessel diseas bil.  [name] SPINE SURGERY    . tendon achillies lengthing and sesamoid     Family History  Problem Relation Age of Onset  . Heart disease Mother   . Hypertension Mother   . Heart attack Mother   . Hypertension Father   . Diabetes Father   . Diabetes Son   . Heart disease  Son   . Hypertension Son   . Colon cancer Neg Hx    Social History   Socioeconomic History  . Marital status: Married    Spouse name: [name]   . Number of children: 4  . Years of education: college  . Highest education level: Not on file  Occupational History  . Occupation: Firefighter   Tobacco Use  . Smoking status: Former Smoker    Years: 1.00    Types: Pipe    Quit date: 07/08/1977    Years since quitting: 43.6  . Smokeless tobacco: Never Used  Vaping Use  . Vaping Use: Never used  Substance and Sexual Activity  . Alcohol use: No    Alcohol/week: 0.0 standard drinks  . Drug use: No  . Sexual activity: Not on file  Other Topics Concern  . Not on file  Social History Narrative   Retired from medical -[employer]      Lives with [name]    Cat: Jazz      Enjoy: sleeping a lot       Diet: eats all food groups -chicken, steak-chopped, mostly veggie   Caffeine: coffee and soda "a lot"   Water: 3-4 cups daily      Wears seat belt   Does not use phone while driving    Oceanographer at home    weapons at home        Social Determinants of Health   Financial Resource Strain: [place]   . Difficulty of Paying Living Expenses: Not hard at all  Food Insecurity: No Food Insecurity  . Worried About Charity fundraiser in the Last Year: Never true  . Ran Out of Food in the Last Year: Never true  Transportation Needs: No Transportation Needs  . Lack of Transportation (Medical): No  . Lack of Transportation (Non-Medical): No  Physical Activity: Inactive  . Days of Exercise per Week: 0 days  . Minutes of Exercise per Session: 0 min  Stress: No Stress Concern Present  . Feeling of Stress : Only a little  Social Connections: Moderately Isolated  . Frequency of Communication with Friends and Family: Three times a week  . Frequency of Social Gatherings with Friends and Family: Three times a week  . Attends  Religious Services: Never  . Active Member of Clubs  or Organizations: No  . Attends Archivist Meetings: Never  . Marital Status: Married    Tobacco Counseling Counseling given: Not Answered   Clinical Intake:  Pre-visit preparation completed: No  Pain : 0-10 Pain Score: 10-Worst pain ever Pain Onset: Yesterday Pain Frequency: Occasional     Nutritional Status: BMI 25 -29 Overweight Diabetes: Yes CBG done?: No Did pt. bring in CBG monitor from home?: No  How often do you need to have someone help you when you read instructions, pamphlets, or other written materials from your doctor or pharmacy?: 1 - Never  Diabetic? yes  Interpreter Needed?: No      Activities of Daily Living In your present state of health, do you have any difficulty performing the following activities: 05/01/2020  Hearing? N  Vision? N  Difficulty concentrating or making decisions? N  Walking or climbing stairs? Y  Dressing or bathing? Y  Some recent data might be hidden    Patient Care Team: Perlie Mayo, NP as PCP - General (Family Medicine) [name], MD as PCP - Cardiology (Cardiology)  Indicate any recent Medical Services you may have received from other than [facility] providers in the past year (date may be approximate).     Assessment:   This is a routine wellness examination for [place].  Hearing/Vision screen No exam data present  Dietary issues and exercise activities discussed: Exercise limited by: orthopedic condition(s);cardiac condition(s)  Goals Addressed            This Visit's Progress   . HEMOGLOBIN A1C < 7      . Prevent falls        Depression Screen PHQ 2/9 Scores 01/22/2021 12/22/2020 09/30/2020 08/11/2020 07/11/2020 06/03/2020 03/26/2020  PHQ - 2 Score 1 4 0 0 0 0 0  PHQ- 9 Score 5 12 - - - - -    Fall Risk Fall Risk  01/22/2021 12/22/2020 09/30/2020 09/30/2020 08/11/2020  Falls in the past year? 0 1 1 0 1  Number falls in past yr: 0 1 1 0 1  Injury with Fall? 0 0 - 0 0  Comment - - - - -  Risk  Factor Category  - - - - -  Comment - - - - -  Risk for fall due to : No Fall Risks Impaired mobility;Impaired balance/gait - No Fall Risks Impaired balance/gait;Impaired mobility  Follow up Falls evaluation completed Falls evaluation completed - - Falls evaluation completed    FALL RISK PREVENTION PERTAINING TO THE HOME:  Any stairs in or around the home? No  If so, are there any without handrails? No  Home free of loose throw rugs in walkways, pet beds, electrical cords, etc? Yes  Adequate lighting in your home to reduce risk of falls? Yes   ASSISTIVE DEVICES UTILIZED TO PREVENT FALLS:  Life alert? No  Use of a cane, walker or w/c? Yes  both Grab bars in the bathroom? Yes  Shower chair or bench in shower? Yes  Elevated toilet seat or a handicapped toilet? No   TIMED UP AND GO:  Was the test performed? No .  Length of time to ambulate 10 feet:  sec.     Cognitive Function:     6CIT Screen 02/02/2021  What Year? 0 points  What month? 0 points  What time? 0 points  Count back from 20 0 points  Months in reverse 0 points  Repeat phrase 2 points  Total Score 2    Immunizations Immunization History  Administered Date(s) Administered  . Fluad Quad(high Dose 65+) 06/03/2020  . Moderna Sars-Covid-2 Vaccination 11/07/2019, 12/05/2019, 08/21/2020  . PFIZER(Purple Top)SARS-COV-2 Vaccination 08/21/2020  . Pneumococcal Conjugate-13 07/30/2020  . Pneumococcal Polysaccharide-23 03/10/2012    TDAP status: Up to date  Flu Vaccine status: Up to date  Pneumococcal vaccine status: Up to date  Covid-19 vaccine status: Completed vaccines   Qualifies for Shingles Vaccine? Yes   Zostavax completed No   Shingrix Completed?: Yes  Screening Tests Health Maintenance  Topic Date Due  . TETANUS/TDAP  06/03/2021 (Originally 10/24/1964)  . Hepatitis C Screening  06/03/2021 (Originally 10/24/1963)  . OPHTHALMOLOGY EXAM  02/07/2021  . INFLUENZA VACCINE  04/20/2021  . FOOT EXAM   07/14/2021  . HEMOGLOBIN A1C  07/29/2021  . URINE MICROALBUMIN  01/26/2022  . COVID-19 Vaccine  Completed  . PNA vac Low Risk Adult  Completed  . HPV VACCINES  Aged Out    Health Maintenance   There are no preventive care reminders to display for this patient.  Colorectal cancer screening: Referral to GI placed already set up. Pt aware the office will call re: appt.  Lung Cancer Screening: (Low Dose CT Chest recommended if Age 22-80 years, 30 pack-year currently smoking OR have quit w/in 15years.) does not qualify.   Lung Cancer Screening Referral: does not qualify   Additional Screening:  Hepatitis C Screening: does qualify; Completed   Vision Screening: Recommended annual ophthalmology exams for early detection of glaucoma and other disorders of the eye. Is the patient up to date with their annual eye exam?  Yes  Who is the provider or what is the name of the office in which the patient attends annual eye exams? [facility] If pt is not established with a provider, would they like to be referred to a provider to establish care? No .   Dental Screening: Recommended annual dental exams for proper oral hygiene  Community Resource Referral / Chronic Care Management: CRR required this visit?  No   CCM required this visit?  No      Plan:     I have personally reviewed and noted the following in the patient's chart:   . Medical and social history . Use of alcohol, tobacco or illicit drugs  . Current medications and supplements including opioid prescriptions. Patient is not currently taking opioid prescriptions. . Functional ability and status . Nutritional status . Physical activity . Advanced directives . List of other physicians . Hospitalizations, surgeries, and ER visits in previous 12 months . Vitals . Screenings to include cognitive, depression, and falls . Referrals and appointments  In addition, I have reviewed and discussed with patient certain preventive  protocols, quality metrics, and best practice recommendations. A written personalized care plan for preventive services as well as general preventive health recommendations were provided to patient.     Kate Sable, LPN, LPN   [id number]   Nurse Notes: Visit performed by telephone. Patient at home and supervising provider in the office. Patient consents to telephone visit. Time spent with pt 25 mins.

## 2021-02-02 NOTE — Patient Instructions (Signed)
[name] , Thank you for taking time to come for your Medicare Wellness Visit. I appreciate your ongoing commitment to your health goals. Please review the following plan we discussed and let me know if I can assist you in the future.   Screening recommendations/referrals: Colonoscopy: to be repeated due to inadequate pre-preparation  Recommended yearly ophthalmology/optometry visit for glaucoma screening and checkup Recommended yearly dental visit for hygiene and checkup  Vaccinations: Influenza vaccine: up to date- due again in Sept 2022 Pneumococcal vaccine: up to date  Tdap vaccine: states he received it within the past 5 years. Due every 10 years Shingles vaccine: up to date       Next appointment: Feb 04, 2022 3:00pm  Preventive Care 77 Years and Older, Male Preventive care refers to lifestyle choices and visits with your health care provider that can promote health and wellness. What does preventive care include?  A yearly physical exam. This is also called an annual well check.  Dental exams once or twice a year.  Routine eye exams. Ask your health care provider how often you should have your eyes checked.  Personal lifestyle choices, including:  Daily care of your teeth and gums.  Regular physical activity.  Eating a healthy diet.  Avoiding tobacco and drug use.  Limiting alcohol use.  Practicing safe sex.  Taking low doses of aspirin every day.  Taking vitamin and mineral supplements as recommended by your health care provider. What happens during an annual well check? The services and screenings done by your health care provider during your annual well check will depend on your age, overall health, lifestyle risk factors, and family history of disease. Counseling  Your health care provider may ask you questions about your:  Alcohol use.  Tobacco use.  Drug use.  Emotional well-being.  Home and relationship well-being.  Sexual  activity.  Eating habits.  History of falls.  Memory and ability to understand (cognition).  Work and work Statistician. Screening  You may have the following tests or measurements:  Height, weight, and BMI.  Blood pressure.  Lipid and cholesterol levels. These may be checked every 5 years, or more frequently if you are over 30 years old.  Skin check.  Lung cancer screening. You may have this screening every year starting at age 64 if you have a 30-pack-year history of smoking and currently smoke or have quit within the past 15 years.  Fecal occult blood test (FOBT) of the stool. You may have this test every year starting at age 32.  Flexible sigmoidoscopy or colonoscopy. You may have a sigmoidoscopy every 5 years or a colonoscopy every 10 years starting at age 40.  Prostate cancer screening. Recommendations will vary depending on your family history and other risks.  Hepatitis C blood test.  Hepatitis B blood test.  Sexually transmitted disease (STD) testing.  Diabetes screening. This is done by checking your blood sugar (glucose) after you have not eaten for a while (fasting). You may have this done every 1-3 years.  Abdominal aortic aneurysm (AAA) screening. You may need this if you are a current or former smoker.  Osteoporosis. You may be screened starting at age 88 if you are at high risk. Talk with your health care provider about your test results, treatment options, and if necessary, the need for more tests. Vaccines  Your health care provider may recommend certain vaccines, such as:  Influenza vaccine. This is recommended every year.  Tetanus, diphtheria, and acellular pertussis (Tdap,  Td) vaccine. You may need a Td booster every 10 years.  Zoster vaccine. You may need this after age 102.  Pneumococcal 13-valent conjugate (PCV13) vaccine. One dose is recommended after age 53.  Pneumococcal polysaccharide (PPSV23) vaccine. One dose is recommended after age  67. Talk to your health care provider about which screenings and vaccines you need and how often you need them. This information is not intended to replace advice given to you by your health care provider. Make sure you discuss any questions you have with your health care provider. Document Released: 10/03/2015 Document Revised: 05/26/2016 Document Reviewed: 07/08/2015 Elsevier Interactive Patient Education  2017 [place] Prevention in the Home Falls can cause injuries. They can happen to people of all ages. There are many things you can do to make your home safe and to help prevent falls. What can I do on the outside of my home?  Regularly fix the edges of walkways and driveways and fix any cracks.  Remove anything that might make you trip as you walk through a door, such as a raised step or threshold.  Trim any bushes or trees on the path to your home.  Use bright outdoor lighting.  Clear any walking paths of anything that might make someone trip, such as rocks or tools.  Regularly check to see if handrails are loose or broken. Make sure that both sides of any steps have handrails.  Any raised decks and porches should have guardrails on the edges.  Have any leaves, snow, or ice cleared regularly.  Use sand or salt on walking paths during winter.  Clean up any spills in your garage right away. This includes oil or grease spills. What can I do in the bathroom?  Use night lights.  Install grab bars by the toilet and in the tub and shower. Do not use towel bars as grab bars.  Use non-skid mats or decals in the tub or shower.  If you need to sit down in the shower, use a plastic, non-slip stool.  Keep the floor dry. Clean up any water that spills on the floor as soon as it happens.  Remove soap buildup in the tub or shower regularly.  Attach bath mats securely with double-sided non-slip rug tape.  Do not have throw rugs and other things on the floor that can make  you trip. What can I do in the bedroom?  Use night lights.  Make sure that you have a light by your bed that is easy to reach.  Do not use any sheets or blankets that are too big for your bed. They should not hang down onto the floor.  Have a firm chair that has side arms. You can use this for support while you get dressed.  Do not have throw rugs and other things on the floor that can make you trip. What can I do in the kitchen?  Clean up any spills right away.  Avoid walking on wet floors.  Keep items that you use a lot in easy-to-reach places.  If you need to reach something above you, use a strong step stool that has a grab bar.  Keep electrical cords out of the way.  Do not use floor polish or wax that makes floors slippery. If you must use wax, use non-skid floor wax.  Do not have throw rugs and other things on the floor that can make you trip. What can I do with my stairs?  Do not  leave any items on the stairs.  Make sure that there are handrails on both sides of the stairs and use them. Fix handrails that are broken or loose. Make sure that handrails are as long as the stairways.  Check any carpeting to make sure that it is firmly attached to the stairs. Fix any carpet that is loose or worn.  Avoid having throw rugs at the top or bottom of the stairs. If you do have throw rugs, attach them to the floor with carpet tape.  Make sure that you have a light switch at the top of the stairs and the bottom of the stairs. If you do not have them, ask someone to add them for you. What else can I do to help prevent falls?  Wear shoes that:  Do not have high heels.  Have rubber bottoms.  Are comfortable and fit you well.  Are closed at the toe. Do not wear sandals.  If you use a stepladder:  Make sure that it is fully opened. Do not climb a closed stepladder.  Make sure that both sides of the stepladder are locked into place.  Ask someone to hold it for you, if  possible.  Clearly mark and make sure that you can see:  Any grab bars or handrails.  First and last steps.  Where the edge of each step is.  Use tools that help you move around (mobility aids) if they are needed. These include:  Canes.  Walkers.  Scooters.  Crutches.  Turn on the lights when you go into a dark area. Replace any light bulbs as soon as they burn out.  Set up your furniture so you have a clear path. Avoid moving your furniture around.  If any of your floors are uneven, fix them.  If there are any pets around you, be aware of where they are.  Review your medicines with your doctor. Some medicines can make you feel dizzy. This can increase your chance of falling. Ask your doctor what other things that you can do to help prevent falls. This information is not intended to replace advice given to you by your health care provider. Make sure you discuss any questions you have with your health care provider. Document Released: 07/03/2009 Document Revised: 02/12/2016 Document Reviewed: 10/11/2014 Elsevier Interactive Patient Education  2017 [employer].

## 2021-02-03 ENCOUNTER — Encounter (HOSPITAL_COMMUNITY): Payer: Self-pay

## 2021-02-03 ENCOUNTER — Ambulatory Visit (HOSPITAL_COMMUNITY): Payer: HMO

## 2021-02-03 ENCOUNTER — Other Ambulatory Visit: Payer: Self-pay

## 2021-02-03 VITALS — BP 133/63 | HR 74

## 2021-02-03 DIAGNOSIS — M25562 Pain in left knee: Secondary | ICD-10-CM | POA: Diagnosis not present

## 2021-02-03 DIAGNOSIS — R262 Difficulty in walking, not elsewhere classified: Secondary | ICD-10-CM

## 2021-02-03 DIAGNOSIS — E08621 Diabetes mellitus due to underlying condition with foot ulcer: Secondary | ICD-10-CM

## 2021-02-03 DIAGNOSIS — L97411 Non-pressure chronic ulcer of right heel and midfoot limited to breakdown of skin: Secondary | ICD-10-CM

## 2021-02-03 NOTE — Therapy (Signed)
[place], [place], [address] Phone: [phone]   Fax:  [phone]  Wound Care Therapy  Patient Details  Name: [name] MRN: [id number] Date of Birth: Aug 25, 1946 Referring Provider (PT): Perlie Mayo   Encounter Date: 02/03/2021   PT End of Session - 02/03/21 1208    Visit Number 26    Number of Visits 30    Date for PT Re-Evaluation 02/10/21    Authorization Type [employer], no VL, no auth , $15 co pay    Progress Note Due on Visit 32    PT Start Time 1100    PT Stop Time 1145    PT Time Calculation (min) 45 min    Activity Tolerance Patient limited by pain;Patient tolerated treatment well;Other (comment)   restless leg   Behavior During Therapy [facility] for tasks assessed/performed           Past Medical History:  Diagnosis Date  . Aftercare following surgery of the circulatory system, [place] 12/04/2013  . Angina decubitus ([place]) 05/19/2016  . Arrhythmia 05/20/2016  . Arthritis   . ARTHRITIS, RIGHT FOOT 06/26/2008   Qualifier: Diagnosis of  By: Aline Brochure MD, Dorothyann Peng    . Cardiomyopathy- EF NL 01/2013, now 30-35% echo 03/14/2012  . Carotid artery occlusion    left s/p CEA  . Cellulitis of left foot 05/07/2018  . CHF (congestive heart failure) ([place])   . Critical lower limb ischemia (HCC) 02/28/2014   Critical limb ischemia   . Diabetes mellitus   . Elevated troponin 04/11/2016  . GERD (gastroesophageal reflux disease)   . History of stroke June 2013 03/14/2012  . HOH (hard of hearing)   . Hypertension   . Hypothyroidism   . Nonhealing skin ulcer ([place]) 10/28/2014  . Obstructive sleep apnea   . Orthostatic hypotension   . Osteomyelitis ([place])    left great toe  . Pain in the chest 05/17/2016  . Peripheral arterial disease ([place]), s/p PTA x 2 RLE    nonhealing ulcers bilaterally on each great toe  . Pneumonia   . PONV (postoperative nausea and vomiting)   . Pulmonary nodule 05/17/2016  . Restless  leg syndrome   . Shortness of breath   . Stroke [facility]) March 08, 2012  . Toe osteomyelitis, left ([place])   . Ulcer of great toe, left, with necrosis of bone ([place])   . Wears dentures     Past Surgical History:  Procedure Laterality Date  . AMPUTATION TOE Left 07/25/2018   Procedure: AMPUTATION TOE INTERPHALANGEAL HALLUX LEFT;  Surgeon: Evelina Bucy, DPM;  Location: [place];  Service: Podiatry;  Laterality: Left;  . ANGIOPLASTY  02/28/14   diamond back orbital rotational atherectomy of Rt. tibial  . BACK SURGERY    . BONE BIOPSY Left 07/25/2018   Procedure: SUPERFICIAL BONE BIOPSY;  Surgeon: Evelina Bucy, DPM;  Location: [place];  Service: Podiatry;  Laterality: Left;  . CARDIAC CATHETERIZATION N/A 05/19/2016   Procedure: Right/Left Heart Cath and Coronary Angiography;  Surgeon: Belva Crome, MD;  Location: [place] CV LAB;  Service: Cardiovascular;  Laterality: N/A;  . CERVICAL FUSION    . ENDARTERECTOMY Left 11/29/2013   Procedure: ENDARTERECTOMY CAROTID;  Surgeon: Serafina Mitchell, MD;  Location: [place];  Service: Vascular;  Laterality: Left;  . ESOPHAGOGASTRODUODENOSCOPY  02/2010   Dr. Gala Romney: patient presented with food impaction, schatzki ring with superimposed component of stricture with erosive reflux esophagitis, s/p  disimpaction but dilation planned at later date   . ESOPHAGOGASTRODUODENOSCOPY (EGD) WITH PROPOFOL N/A 08/13/2019   Dr. Gala Romney: Erosive reflux esophagitis with mild stricture and incidental Mallory-Weiss tear which precluded esophageal dilation.  Medium sized hiatal hernia.  [name] ESOPHAGOGASTRODUODENOSCOPY (EGD) WITH PROPOFOL N/A 10/11/2019   Dr. Gala Romney: Esophageal stenosis status post dilation, moderate hiatal hernia  . EYE SURGERY    . FLEXIBLE SIGMOIDOSCOPY N/A 08/13/2019   Procedure: FLEXIBLE SIGMOIDOSCOPY;  Surgeon: [name], MD;  [facility];  Service: Endoscopy;  Laterality: N/A;  colonoscopy aboerted due to formed stool and poor prep  . FOOT SURGERY     . KNEE ARTHROSCOPY WITH MEDIAL MENISECTOMY Left 05/06/2020   Procedure: KNEE ARTHROSCOPY WITH MEDIAL MENISCECTOMY AND LATERAL MENISCECTOMY;  Surgeon: [name], MD;  Location: [facility];  Service: Orthopedics;  Laterality: Left;  . Lower ext duplex doppler  03/14/14   Rt ABI 1.2  . LOWER EXTREMITY ANGIOGRAM Bilateral 02/18/2014   Procedure: LOWER EXTREMITY ANGIOGRAM;  Surgeon: [name], MD;  Location: [facility] CATH LAB;  Service: Cardiovascular;  Laterality: Bilateral;  . LOWER EXTREMITY ANGIOGRAM N/A 10/31/2014   Procedure: LOWER EXTREMITY ANGIOGRAM;  Surgeon: [name], MD;  Location: [facility] C[facility] LAB;  Service: Cardiovascular;  Laterality: N/A;  . Venia Minks DILATION N/A 10/11/2019   Procedure: Venia Minks DILATION;  Surgeon: [name], MD;  [facility];  Service: Endoscopy;  Laterality: N/A;  . MULTIPLE TOOTH EXTRACTIONS    . PV angiogram  02/18/2014   tibial vessel diseas bil.  [name] SPINE SURGERY    . tendon achillies lengthing and sesamoid      Vitals:   02/03/21 1158 02/03/21 1257 02/03/21 1258  BP: (!) 153/70 (!) 115/53 133/63  Pulse: 69 68 74      Subjective Assessment - 02/03/21 1158    Subjective Pt stated his Rt hip is bothering him today, pain scale 10/10 with standing and walking.    Pertinent History CHF , R foot wound, left knee scope, DB, Restless leg syndrome, hx of stroke.    Patient Stated Goals be able to walk normally and function correctly.    Currently in Pain? Yes    Pain Score 10-Worst pain ever    Pain Location Hip    Pain Orientation Right    Pain Type Acute pain    Pain Onset Yesterday    Pain Frequency Occasional    Aggravating Factors  standing and walking    Pain Relieving Factors sitting    Effect of Pain on Daily Activities limits                     Wound Therapy - 02/03/21 1158    Subjective Pt stated his Rt hip is bothering him today, pain scale 10/10 with standing and walking.    Patient and Family Stated Goals  to have wound to heal and to be able to walk    Date of Onset --   4-5 years ago   Prior Treatments wound clinics, neosporin on bandaid but bandaid wound stick.    Pain Scale 0-10    Evaluation and Treatment Procedures Explained to Patient/Family Yes    Evaluation and Treatment Procedures agreed to    Wound Properties Date First Assessed: 10/20/20 Time First Assessed: 1000 Wound Type: Diabetic ulcer Location: Foot Location Orientation: Anterior;Right Wound Description (Comments): bottom of foot Present on Admission: Yes   Wound Image View All Images View Images  Dressing Type Gauze (Comment)   medihoney, xeroform, 2x2 medipore tape   Dressing Changed Changed    Dressing Status Old drainage    Dressing Change Frequency PRN    Site / Wound Assessment Pink;Clean;Red    % Wound base Red or Granulating 100%    % Wound base Yellow/Fibrinous Exudate 0%    Wound Length (cm) 0.5 cm    Wound Width (cm) 0.4 cm    Wound Depth (cm) 0.2 cm    Wound Volume (cm^3) 0.04 cm^3    Wound Surface Area (cm^2) 0.2 cm^2    Margins Unattached edges (unapproximated)    Drainage Amount Minimal    Drainage Description Serosanguineous    Treatment Cleansed;Debridement (Selective)    Selective Debridement - Location edges and calloused perimeter; expect wound to get larger with debridement    Selective Debridement - Tools Used Forceps;Scalpel;Scissors    Selective Debridement - Tissue Removed callous devitalized tissue.    Wound Therapy - Clinical Statement Pt limited by pain Rt hip limiting ability to ambulate and stand.  Pt admits he did not take his restless leg medication which increased difficulty with selective debridement.  Assessed BP in sitting (115/53 mmHg 68BPM), supine (153/70 mmHG 69BPM) and standing (133/63 mmHg 74BPM).  C/O dizziness transitioning from supine to sitting.  Pt encouraged to contact MD about current medication and high pain scale.  Pt assisted to vehicle in [facility] due to high pain scale and  difficulty with gait for fall prevention.    Wound Therapy - Functional Problem List difficulty walking, washing, dressing    Factors Delaying/Impairing Wound Healing Diabetes Mellitus;Immobility    Wound Therapy - Frequency 2X / week    Wound Therapy - Current Recommendations PT    Wound Plan Continue wound care with appropriate dressings.    Dressing  medihoney wound bed, xeroform on top, vaseline perimeter, 2x2 and medipore tape.                     PT Short Term Goals - 01/13/21 1123      PT SHORT TERM GOAL #1   Title Patient will be independent in self management strategies to improve quality of life and functional outcomes.    Baseline performs 1-2x/day    Status Achieved      PT SHORT TERM GOAL #2   Title Patient will be able to report at least 25% improvement in overall mobility.    Baseline feels 100% better    Status Achieved      PT SHORT TERM GOAL #3   Title Patient's wound will be reduced in volume by at least 25% to demonstrate improved wound healing    Baseline was 2.58m^3, now .13 m^3    Status Achieved             PT Long Term Goals - 01/13/21 1130      PT LONG TERM GOAL #1   Title Patient will be able to ambulate at least 100 feet with RW in 2 minutes to demonstrate improved ambulatory endurance    Baseline 11/19/20:  2MWT 124ft with rollator, improved foot clearance followng cueing from therapist    Status Achieved      PT LONG TERM GOAL #2   Title Patient's wound will reduce in overall volume byt at least 50% to demonstrate improved wound healing    Baseline was 2.68m^3 and now .103 m ^3    Status Achieved      PT LONG TERM  GOAL #3   Title Patient will be able to demonstate 5-110 degrees in left knee to demonstrate improved knee mobility    Baseline 11/19/20: Lt 18-123 degrees, Rt 5-120 degrees    Status On-going      PT LONG TERM GOAL #4   Title Wound will be completely healed to reduce risk of infection    Time 4    Period Weeks    Status  New    Target Date 02/10/21      PT LONG TERM GOAL #5   Title Patient will be able to perform 5x sit to stand without use of arms in < 14.5 seconds to demonstrate improved functional strength    Time 4    Period Weeks    Status New    Target Date 02/10/21                  Patient will benefit from skilled therapeutic intervention in order to improve the following deficits and impairments:     Visit Diagnosis: Diabetic ulcer of right midfoot associated with diabetes mellitus due to underlying condition, limited to breakdown of skin ([place])  Difficulty in walking, not elsewhere classified     Problem List Patient Active Problem List   Diagnosis Date Noted  . Erectile dysfunction 01/22/2021  . Gait abnormality 09/30/2020  . Mild non proliferative diabetic retinopathy ([place]) 07/16/2020  . Need for immunization against influenza 06/03/2020  . S/P left knee arthroscopy 05/06/20 05/13/2020  . Abnormal MRI 03/26/2020  . Joint disorder of knee 03/26/2020  . Fall at home, initial encounter 03/13/2020  . Anemia 09/03/2019  . Reflux esophagitis 09/03/2019  . Esophageal dysphagia 05/30/2019  . Constipation 05/30/2019  . Positive colorectal cancer screening using Cologuard test 05/30/2019  . Diabetic foot ulcer ([place]) 09/06/2018  . Congestive heart failure ([place]) 05/17/2016  . Bilateral carotid artery disease ([place]) 07/08/2014  . Obstructive sleep apnea 02/12/2014  . Restless leg syndrome 10/23/2013  . Overweight with body mass index (BMI) of 29 to 29.9 in adult 10/23/2013  . Hyperlipidemia 07/16/2013  . Peripheral arterial disease ([place]) 07/16/2013  . Hypertension 03/14/2012  . Type 2 diabetes mellitus with circulatory disorder ([place]) 03/14/2012  . Peripheral neuropathy 03/14/2012  . Cardiomyopathy- EF NL 01/2013, now 30-35% echo 03/14/2012   [name], LPTA/CLT; CBIS [phone]  [name] 02/03/2021, 1:00 PM  [place], [place], [address] Phone: [phone]   Fax:  [phone]  Name: [name] MRN: [id number] Date of Birth: 02/02/1946

## 2021-02-05 ENCOUNTER — Other Ambulatory Visit: Payer: Self-pay

## 2021-02-05 ENCOUNTER — Encounter (HOSPITAL_COMMUNITY): Payer: Self-pay

## 2021-02-05 ENCOUNTER — Ambulatory Visit (HOSPITAL_COMMUNITY): Payer: HMO

## 2021-02-05 DIAGNOSIS — L97411 Non-pressure chronic ulcer of right heel and midfoot limited to breakdown of skin: Secondary | ICD-10-CM

## 2021-02-05 DIAGNOSIS — R262 Difficulty in walking, not elsewhere classified: Secondary | ICD-10-CM

## 2021-02-05 DIAGNOSIS — M25562 Pain in left knee: Secondary | ICD-10-CM | POA: Diagnosis not present

## 2021-02-05 DIAGNOSIS — E08621 Diabetes mellitus due to underlying condition with foot ulcer: Secondary | ICD-10-CM

## 2021-02-05 NOTE — Progress Notes (Signed)
  Subjective:  Patient ID: [name], male    DOB: 09-30-1945,  MRN: [id number]  75 y.o. male presents with at risk foot care. Patient has h/o amputation of digital amputation L hallux and thick, elongated toenails b/l lower extremities which are tender when wearing enclosed shoe gear.  Patient's blood sugar was 124 mg/dl yesterday.  PCP: Christopher Larsson, NP and last visit was: 01/22/2021.  Patient states he is still under the care of Wound Care for his right foot wound.   Review of Systems: Negative except as noted in the HPI.   Allergies  Allergen Reactions  . Codeine Nausea And Vomiting  . Tramadol Nausea Only    Objective:  There were no vitals filed for this visit. Constitutional Patient is a pleasant 75 y.o. Caucasian male in NAD. AAO x 3.  Vascular Capillary fill time to digits <3 seconds b/l lower extremities. Faintly palpable DP pulse(s) b/l lower extremities. Nonpalpable PT pulse(s) b/l lower extremities. Pedal hair present. Lower extremity skin temperature gradient within normal limits. No cyanosis or clubbing noted.  Neurologic Normal speech. Protective sensation intact 5/5 intact bilaterally with 10g monofilament b/l.  Dermatologic Pedal skin with normal turgor, texture and tone bilaterally. No open wounds bilaterally. No interdigital macerations bilaterally. Toenails 2-5 bilaterally and R hallux elongated, discolored, dystrophic, thickened, and crumbly with subungual debris and tenderness to dorsal palpation.  Orthopedic: Normal muscle strength 5/5 to all lower extremity muscle groups bilaterally. No pain crepitus or joint limitation noted with ROM b/l. Lower extremity amputation(s): digital amputation L hallux.   Hemoglobin A1C Latest Ref Rng & Units 01/26/2021 09/25/2020 06/03/2020 06/03/2020 06/03/2020  HGBA1C 4.8 - 5.6 % 9.6(H) 7.8(H) 8.6(A) 8.6(A) 8.6  Some recent data might be hidden   Assessment:   1. Onychomycosis of multiple toenails with type 2 diabetes mellitus  and peripheral neuropathy ([name])   2. Status post amputation of left great toe ([name])   3. Type 2 diabetes mellitus with other circulatory complication, without long-term current use of insulin ([name])    Plan:  Patient was evaluated and treated and all questions answered.  Onychomycosis with pain -Nails palliatively debridement as below. -Educated on self-care  Procedure: Nail Debridement Rationale: Pain Type of Debridement: manual, sharp debridement. Instrumentation: Nail nipper, rotary burr. Number of Nails: 9  -Examined patient. -Patient to continue soft, supportive shoe gear daily. -Toenails 2-5 bilaterally and R hallux debrided in length and girth without iatrogenic bleeding with sterile nail nipper and dremel.  -Patient to report any pedal injuries to medical professional immediately. -Patient/POA to call should there be question/concern in the interim.  Return in about 3 months (around 05/02/2021).  Marzetta Board, DPM

## 2021-02-05 NOTE — Therapy (Addendum)
[place], [place], [address] Phone: [phone]   Fax:  [phone]  Wound Care Therapy  PHYSICAL THERAPY DISCHARGE SUMMARY  Visits from Start of Care: 27  Current functional level related to goals / functional outcomes: Could not be reassessed secondary to unplanned discharge     Remaining deficits: Could not be reassessed secondary to unplanned discharge     Education / Equipment: Could not be reassessed secondary to unplanned discharge    Patient agrees to discharge. Patient goals were not met. Patient is being discharged due to not returning since the last visit.   9:02 AM, 03/05/21 [name], DPT Physical Therapy with [facility]  [phone] office    Patient Details  Name: TEIGAN SAHLI MRN: [id number] Date of Birth: 20-Dec-1945 Referring Provider (PT): Perlie Mayo   Encounter Date: 02/05/2021   PT End of Session - 02/05/21 1129     Visit Number 27    Number of Visits 30    Date for PT Re-Evaluation 02/10/21    Authorization Type [employer], no VL, no auth , $15 co pay    Progress Note Due on Visit 32    PT Start Time 1030   Restroom break from 1030-1040   PT Stop Time 1110    PT Time Calculation (min) 40 min    Activity Tolerance Patient limited by pain;Patient tolerated treatment well;Other (comment)    Behavior During Therapy WFL for tasks assessed/performed             Past Medical History:  Diagnosis Date   Aftercare following surgery of the circulatory system, NEC 12/04/2013   Angina decubitus ([place]) 05/19/2016   Arrhythmia 05/20/2016   Arthritis    ARTHRITIS, RIGHT FOOT 06/26/2008   Qualifier: Diagnosis of  By: Aline Brochure MD, [name]     Cardiomyopathy- EF NL 01/2013, now 30-35% echo 03/14/2012   Carotid artery occlusion    left s/p CEA   Cellulitis of left foot 05/07/2018   CHF (congestive heart failure) (HCC)    Critical lower limb  ischemia ([place]) 02/28/2014   Critical limb ischemia    Diabetes mellitus    Elevated troponin 04/11/2016   GERD (gastroesophageal reflux disease)    History of stroke June 2013 03/14/2012   HOH (hard of hearing)    Hypertension    Hypothyroidism    Nonhealing skin ulcer ([place]) 10/28/2014   Obstructive sleep apnea    Orthostatic hypotension    Osteomyelitis (HCC)    left great toe   Pain in the chest 05/17/2016   Peripheral arterial disease ([place]), s/p PTA x 2 RLE    nonhealing ulcers bilaterally on each great toe   Pneumonia    PONV (postoperative nausea and vomiting)    Pulmonary nodule 05/17/2016   Restless leg syndrome    Shortness of breath    Stroke [facility]) March 08, 2012   Toe osteomyelitis, left [facility])    Ulcer of great toe, left, with necrosis of bone ([place])    Wears dentures     Past Surgical History:  Procedure Laterality Date   AMPUTATION TOE Left 07/25/2018   Procedure: AMPUTATION TOE INTERPHALANGEAL HALLUX LEFT;  Surgeon: Evelina Bucy, DPM;  Location: [place];  Service: Podiatry;  Laterality: Left;   ANGIOPLASTY  02/28/14   diamond back orbital rotational atherectomy of Rt. tibial   BACK SURGERY     BONE BIOPSY Left 07/25/2018   Procedure: SUPERFICIAL BONE  BIOPSY;  Surgeon: Evelina Bucy, DPM;  Location: [place];  Service: Podiatry;  Laterality: Left;   CARDIAC CATHETERIZATION N/A 05/19/2016   Procedure: Right/Left Heart Cath and Coronary Angiography;  Surgeon: Belva Crome, MD;  Location: [place] [facility];  Service: Cardiovascular;  Laterality: N/A;   CERVICAL FUSION     ENDARTERECTOMY Left 11/29/2013   Procedure: ENDARTERECTOMY CAROTID;  Surgeon: Serafina Mitchell, MD;  Location: [place];  Service: Vascular;  Laterality: Left;   ESOPHAGOGASTRODUODENOSCOPY  02/2010   Dr. Gala Romney: patient presented with food impaction, schatzki ring with superimposed component of stricture with erosive reflux esophagitis, s/p disimpaction but dilation planned at later date     ESOPHAGOGASTRODUODENOSCOPY (EGD) WITH PROPOFOL N/A 08/13/2019   Dr. Gala Romney: Erosive reflux esophagitis with mild stricture and incidental Mallory-Weiss tear which precluded esophageal dilation.  Medium sized hiatal hernia.   ESOPHAGOGASTRODUODENOSCOPY (EGD) WITH PROPOFOL N/A 10/11/2019   Dr. Gala Romney: Esophageal stenosis status post dilation, moderate hiatal hernia   EYE SURGERY     FLEXIBLE SIGMOIDOSCOPY N/A 08/13/2019   Procedure: FLEXIBLE SIGMOIDOSCOPY;  Surgeon: [name], MD;  [facility];  Service: Endoscopy;  Laterality: N/A;  colonoscopy aboerted due to formed stool and poor prep   FOOT SURGERY     KNEE ARTHROSCOPY WITH MEDIAL MENISECTOMY Left 05/06/2020   Procedure: KNEE ARTHROSCOPY WITH MEDIAL MENISCECTOMY AND LATERAL MENISCECTOMY;  Surgeon: [name], MD;  Location: [facility];  Service: Orthopedics;  Laterality: Left;   Lower ext duplex doppler  03/14/14   Rt ABI 1.2   LOWER EXTREMITY ANGIOGRAM Bilateral 02/18/2014   Procedure: LOWER EXTREMITY ANGIOGRAM;  Surgeon: [name], MD;  Location: [facility];  Service: Cardiovascular;  Laterality: Bilateral;   LOWER EXTREMITY ANGIOGRAM N/A 10/31/2014   Procedure: LOWER EXTREMITY ANGIOGRAM;  Surgeon: [name], MD;  Location: [facility] CATH LAB;  Service: Cardiovascular;  Laterality: N/A;   MALONEY DILATION N/A 10/11/2019   Procedure: Venia Minks DILATION;  Surgeon: [name], MD;  [facility];  Service: Endoscopy;  Laterality: N/A;   MULTIPLE TOOTH EXTRACTIONS     PV angiogram  02/18/2014   tibial vessel diseas bil.   SPINE SURGERY     tendon achillies lengthing and sesamoid      There were no vitals filed for this visit.    Subjective Assessment - 02/05/21 1123     Subjective Pt stated Rt hip is 10/10 during standing and walking.  Has MD apt next Wednesday.    Pertinent History CHF , R foot wound, left knee scope, DB, Restless leg syndrome, hx of stroke.    Patient Stated Goals be able to walk  normally and function correctly.    Currently in Pain? Yes    Pain Score 10-Worst pain ever    Pain Location Hip    Pain Orientation Right    Pain Descriptors / Indicators Sharp;Throbbing;Aching    Pain Type Acute pain    Pain Onset 1 to 4 weeks ago    Pain Frequency --   with weight bearing   Aggravating Factors  standing and walking    Pain Relieving Factors sitting    Effect of Pain on Daily Activities limits                       Wound Therapy - 02/05/21 1123     Subjective Pt stated Rt hip is 10/10 during standing and walking.  Has MD apt next Wednesday.  Patient and Family Stated Goals to have wound to heal and to be able to walk    Date of Onset --   4-5 years   Prior Treatments wound clinics, neosporin on bandaid but bandaid wound stick.    Pain Scale 0-10    Evaluation and Treatment Procedures Explained to Patient/Family Yes    Evaluation and Treatment Procedures agreed to    Wound Properties Date First Assessed: 10/20/20 Time First Assessed: 1000 Wound Type: Diabetic ulcer Location: Foot Location Orientation: Anterior;Right Wound Description (Comments): bottom of foot Present on Admission: Yes   Wound Image View All Images View Images    Dressing Type Gauze (Comment)   Medihoney, xeroform, vaselinme perimeter, 2x2, medipore tape   Dressing Changed Changed    Dressing Status Old drainage    Dressing Change Frequency PRN    Site / Wound Assessment Clean;Pink;Red    % Wound base Red or Granulating 100%    Wound Length (cm) 0.4 cm    Wound Width (cm) 0.3 cm    Wound Depth (cm) 0.1 cm    Wound Volume (cm^3) 0.01 cm^3    Wound Surface Area (cm^2) 0.12 cm^2    Margins Unattached edges (unapproximated)    Drainage Amount Minimal    Drainage Description Serosanguineous    Treatment Cleansed;Debridement (Selective)    Selective Debridement - Location edges and calloused perimeter; expect wound to get larger with debridement    Selective Debridement - Tools  Used Forceps;Scalpel;Scissors    Selective Debridement - Tissue Removed callous devitalized tissue.    Wound Therapy - Clinical Statement Wound is approximating noted with measurements following debridement.  Pt wiht restless leg that did increase difficulty with selective debridement this session.  Pt limited by Rt hip pain, used WC to transport ti treatment room and assisted him back to car following  session.    Wound Therapy - Functional Problem List difficulty walking, washing, dressing    Factors Delaying/Impairing Wound Healing Diabetes Mellitus;Immobility    Wound Therapy - Frequency 2X / week    Wound Therapy - Current Recommendations PT    Wound Plan Continue wound care with appropriate dressings.    Dressing  medihoney wound bed, xeroform on top, vaseline perimeter, 2x2 and medipore tape.                       PT Short Term Goals - 01/13/21 1123       PT SHORT TERM GOAL #1   Title Patient will be independent in self management strategies to improve quality of life and functional outcomes.    Baseline performs 1-2x/day    Status Achieved      PT SHORT TERM GOAL #2   Title Patient will be able to report at least 25% improvement in overall mobility.    Baseline feels 100% better    Status Achieved      PT SHORT TERM GOAL #3   Title Patient's wound will be reduced in volume by at least 25% to demonstrate improved wound healing    Baseline was 2.24m^3, now .13 m^3    Status Achieved               PT Long Term Goals - 01/13/21 1130       PT LONG TERM GOAL #1   Title Patient will be able to ambulate at least 100 feet with RW in 2 minutes to demonstrate improved ambulatory endurance    Baseline 11/19/20:  2MWT  166ft with rollator, improved foot clearance followng cueing from therapist    Status Achieved      PT LONG TERM GOAL #2   Title Patient's wound will reduce in overall volume byt at least 50% to demonstrate improved wound healing    Baseline was  2.36m^3 and now .25 m ^3    Status Achieved      PT LONG TERM GOAL #3   Title Patient will be able to demonstate 5-110 degrees in left knee to demonstrate improved knee mobility    Baseline 11/19/20: Lt 18-123 degrees, Rt 5-120 degrees    Status On-going      PT LONG TERM GOAL #4   Title Wound will be completely healed to reduce risk of infection    Time 4    Period Weeks    Status New    Target Date 02/10/21      PT LONG TERM GOAL #5   Title Patient will be able to perform 5x sit to stand without use of arms in < 14.5 seconds to demonstrate improved functional strength    Time 4    Period Weeks    Status New    Target Date 02/10/21                    Patient will benefit from skilled therapeutic intervention in order to improve the following deficits and impairments:     Visit Diagnosis: Difficulty in walking, not elsewhere classified  Diabetic ulcer of right midfoot associated with diabetes mellitus due to underlying condition, limited to breakdown of skin [facility])     Problem List Patient Active Problem List   Diagnosis Date Noted   Erectile dysfunction 01/22/2021   Gait abnormality 09/30/2020   Mild non proliferative diabetic retinopathy ([place]) 07/16/2020   Need for immunization against influenza 06/03/2020   S/P left knee arthroscopy 05/06/20 05/13/2020   Abnormal MRI 03/26/2020   Joint disorder of knee 03/26/2020   Fall at home, initial encounter 03/13/2020   Anemia 09/03/2019   Reflux esophagitis 09/03/2019   Esophageal dysphagia 05/30/2019   Constipation 05/30/2019   Positive colorectal cancer screening using Cologuard test 05/30/2019   Diabetic foot ulcer ([place]) 09/06/2018   Congestive heart failure ([place]) 05/17/2016   Bilateral carotid artery disease ([place]) 07/08/2014   Obstructive sleep apnea 02/12/2014   Restless leg syndrome 10/23/2013   Overweight with body mass index (BMI) of 29 to 29.9 in adult 10/23/2013   Hyperlipidemia 07/16/2013    Peripheral arterial disease ([place]) 07/16/2013   Hypertension 03/14/2012   Type 2 diabetes mellitus with circulatory disorder ([place]) 03/14/2012   Peripheral neuropathy 03/14/2012   Cardiomyopathy- EF NL 01/2013, now 30-35% echo 03/14/2012   [name], LPTA/CLT; CBIS [phone]  [name] 02/05/2021, 11:30 AM  [place] [address] [place], [place], [address] Phone: [phone]   Fax:  [phone]  Name: JOUSHUA DUGAR MRN: [id number] Date of Birth: 1946/08/18

## 2021-02-10 ENCOUNTER — Ambulatory Visit (HOSPITAL_COMMUNITY): Payer: HMO

## 2021-02-11 ENCOUNTER — Other Ambulatory Visit: Payer: Self-pay

## 2021-02-11 ENCOUNTER — Encounter: Payer: Self-pay | Admitting: Nurse Practitioner

## 2021-02-11 ENCOUNTER — Ambulatory Visit (INDEPENDENT_AMBULATORY_CARE_PROVIDER_SITE_OTHER): Payer: HMO | Admitting: Nurse Practitioner

## 2021-02-11 DIAGNOSIS — M25551 Pain in right hip: Secondary | ICD-10-CM

## 2021-02-11 NOTE — Assessment & Plan Note (Signed)
-  ongoing x 2 weeks; causing difficulty with walking -fell yesterday -referral to ortho

## 2021-02-11 NOTE — Progress Notes (Signed)
Acute Office Visit  Subjective:    Patient ID: [name], male    DOB: [birth year], 75 y.o.   MRN: [id number]  Chief Complaint  Patient presents with  . Hip Pain    R hip pain x 2 weeks; had some unexplained bruising on that side; NKI; no falls. Hurts to walk, very weak gait.     HPI Patient is in today for right hip pain x 2 weeks.  He rates pain at 8/10. He has pain in his buttocks.  He states that he has some jerking when he is trying to walk, and he is scared that he will fall. He states he fell yesterday because his hip gave out while he was walking.  He states he landed on his head, but did not lose consciousness.   He has been seeing [facility] for an ulcer on his right foot.  Past Medical History:  Diagnosis Date  . Aftercare following surgery of the circulatory system, [place] 12/04/2013  . Angina decubitus ([place]) 05/19/2016  . Arrhythmia 05/20/2016  . Arthritis   . ARTHRITIS, RIGHT FOOT 06/26/2008   Qualifier: Diagnosis of  By: Aline Brochure MD, Dorothyann Peng    . Cardiomyopathy- EF NL 01/2013, now 30-35% echo 03/14/2012  . Carotid artery occlusion    left s/p CEA  . Cellulitis of left foot 05/07/2018  . CHF (congestive heart failure) ([place])   . Critical lower limb ischemia (HCC) 02/28/2014   Critical limb ischemia   . Diabetes mellitus   . Elevated troponin 04/11/2016  . GERD (gastroesophageal reflux disease)   . History of stroke June 2013 03/14/2012  . HOH (hard of hearing)   . Hypertension   . Hypothyroidism   . Nonhealing skin ulcer ([place]) 10/28/2014  . Obstructive sleep apnea   . Orthostatic hypotension   . Osteomyelitis ([place])    left great toe  . Pain in the chest 05/17/2016  . Peripheral arterial disease ([place]), s/p PTA x 2 RLE    nonhealing ulcers bilaterally on each great toe  . Pneumonia   . PONV (postoperative nausea and vomiting)   . Pulmonary nodule 05/17/2016  . Restless leg syndrome   . Shortness of breath   . Stroke [facility]) March 08, 2012  . Toe  osteomyelitis, left ([place])   . Ulcer of great toe, left, with necrosis of bone ([place])   . Wears dentures     Past Surgical History:  Procedure Laterality Date  . AMPUTATION TOE Left 07/25/2018   Procedure: AMPUTATION TOE INTERPHALANGEAL HALLUX LEFT;  Surgeon: Evelina Bucy, DPM;  Location: [place];  Service: Podiatry;  Laterality: Left;  . ANGIOPLASTY  02/28/14   diamond back orbital rotational atherectomy of Rt. tibial  . BACK SURGERY    . BONE BIOPSY Left 07/25/2018   Procedure: SUPERFICIAL BONE BIOPSY;  Surgeon: Evelina Bucy, DPM;  Location: [place];  Service: Podiatry;  Laterality: Left;  . CARDIAC CATHETERIZATION N/A 05/19/2016   Procedure: Right/Left Heart Cath and Coronary Angiography;  Surgeon: Belva Crome, MD;  Location: [place] CV LAB;  Service: Cardiovascular;  Laterality: N/A;  . CERVICAL FUSION    . ENDARTERECTOMY Left 11/29/2013   Procedure: ENDARTERECTOMY CAROTID;  Surgeon: Serafina Mitchell, MD;  Location: [place];  Service: Vascular;  Laterality: Left;  . ESOPHAGOGASTRODUODENOSCOPY  02/2010   Dr. Gala Romney: patient presented with food impaction, schatzki ring with superimposed component of stricture with erosive reflux esophagitis, s/p disimpaction but dilation planned at later  date   . ESOPHAGOGASTRODUODENOSCOPY (EGD) WITH PROPOFOL N/A 08/13/2019   Dr. Gala Romney: Erosive reflux esophagitis with mild stricture and incidental Mallory-Weiss tear which precluded esophageal dilation.  Medium sized hiatal hernia.  [name] ESOPHAGOGASTRODUODENOSCOPY (EGD) WITH PROPOFOL N/A 10/11/2019   Dr. Gala Romney: Esophageal stenosis status post dilation, moderate hiatal hernia  . EYE SURGERY    . FLEXIBLE SIGMOIDOSCOPY N/A 08/13/2019   Procedure: FLEXIBLE SIGMOIDOSCOPY;  Surgeon: [name], MD;  [facility];  Service: Endoscopy;  Laterality: N/A;  colonoscopy aboerted due to formed stool and poor prep  . FOOT SURGERY    . KNEE ARTHROSCOPY WITH MEDIAL MENISECTOMY Left 05/06/2020   Procedure:  KNEE ARTHROSCOPY WITH MEDIAL MENISCECTOMY AND LATERAL MENISCECTOMY;  Surgeon: [name], MD;  Location: [facility];  Service: Orthopedics;  Laterality: Left;  . Lower ext duplex doppler  03/14/14   Rt ABI 1.2  . LOWER EXTREMITY ANGIOGRAM Bilateral 02/18/2014   Procedure: LOWER EXTREMITY ANGIOGRAM;  Surgeon: [name], MD;  Location: [facility] CATH LAB;  Service: Cardiovascular;  Laterality: Bilateral;  . LOWER EXTREMITY ANGIOGRAM N/A 10/31/2014   Procedure: LOWER EXTREMITY ANGIOGRAM;  Surgeon: [name], MD;  Location: [facility] CATH LAB;  Service: Cardiovascular;  Laterality: N/A;  . Venia Minks DILATION N/A 10/11/2019   Procedure: Venia Minks DILATION;  Surgeon: [name], MD;  [facility];  Service: Endoscopy;  Laterality: N/A;  . MULTIPLE TOOTH EXTRACTIONS    . PV angiogram  02/18/2014   tibial vessel diseas bil.  [name] SPINE SURGERY    . tendon achillies lengthing and sesamoid      Family History  Problem Relation Age of Onset  . Heart disease Mother   . Hypertension Mother   . Heart attack Mother   . Hypertension Father   . Diabetes Father   . Diabetes Son   . Heart disease Son   . Hypertension Son   . Colon cancer Neg Hx     Social History   Socioeconomic History  . Marital status: Married    Spouse name: [name]   . Number of children: 4  . Years of education: college  . Highest education level: Not on file  Occupational History  . Occupation: Firefighter   Tobacco Use  . Smoking status: Former Smoker    Years: 1.00    Types: Pipe    Quit date: 07/08/1977    Years since quitting: 43.6  . Smokeless tobacco: Never Used  Vaping Use  . Vaping Use: Never used  Substance and Sexual Activity  . Alcohol use: No    Alcohol/week: 0.0 standard drinks  . Drug use: No  . Sexual activity: Not on file  Other Topics Concern  . Not on file  Social History Narrative   Retired from medical -[employer]      Lives with [name]    Cat: Jazz      Enjoy:  sleeping a lot       Diet: eats all food groups -chicken, steak-chopped, mostly veggie   Caffeine: coffee and soda "a lot"   Water: 3-4 cups daily      Wears seat belt   Does not use phone while driving    Oceanographer at home    weapons at home        Social Determinants of Health   Financial Resource Strain: [place]   . Difficulty of Paying Living Expenses: Not hard at all  Food Insecurity: No Food Insecurity  .  Worried About Running Out of Food in the Last Year: Never true  . Ran Out of Food in the Last Year: Never true  Transportation Needs: No Transportation Needs  . Lack of Transportation (Medical): No  . Lack of Transportation (Non-Medical): No  Physical Activity: Inactive  . Days of Exercise per Week: 0 days  . Minutes of Exercise per Session: 0 min  Stress: No Stress Concern Present  . Feeling of Stress : Only a little  Social Connections: Moderately Isolated  . Frequency of Communication with Friends and Family: Three times a week  . Frequency of Social Gatherings with Friends and Family: Three times a week  . Attends Religious Services: Never  . Active Member of Clubs or Organizations: No  . Attends Club or Organization Meetings: Never  . Marital Status: Married  Intimate Partner Violence: Not At Risk  . Fear of Current or Ex-Partner: No  . Emotionally Abused: No  . Physically Abused: No  . Sexually Abused: No    Outpatient Medications Prior to Visit  Medication Sig Dispense Refill  . aspirin EC 81 MG EC tablet Take 1 tablet (81 mg total) by mouth daily. (Patient taking differently: Take 81 mg by mouth at bedtime.)    . b complex vitamins tablet Take 1 tablet by mouth daily.    . carvedilol (COREG) 3.125 MG tablet Take 1 tablet (3.125 mg total) by mouth 2 (two) times daily with a meal. 180 tablet 3  . Cholecalciferol (VITAMIN D3) 5000 units TABS Take 5,000 Units by mouth 2 (two) times daily.     . clopidogrel (PLAVIX) 75 MG tablet TAKE ONE (1) TABLET BY  MOUTH EVERY DAY 90 tablet 0  . Continuous Blood Gluc Receiver (FREESTYLE LIBRE 14 DAY READER) DEVI     . Continuous Blood Gluc Sensor (FREESTYLE LIBRE 14 DAY SENSOR) MISC Inject 1 each into the skin every 14 (fourteen) days. 2 each 5  . Cyanocobalamin 1500 MCG TBDP Take 1,500 mcg by mouth daily.     . DULoxetine (CYMBALTA) 60 MG capsule Take 1 capsule (60 mg total) by mouth daily. 90 capsule 1  . empagliflozin (JARDIANCE) 25 MG TABS tablet Take 1 tablet (25 mg total) by mouth daily before breakfast. 90 tablet 1  . furosemide (LASIX) 40 MG tablet TAKE ONE TABLET (40MG TOTAL) BY MOUTH TWO TIMES DAILY. 180 tablet 3  . glimepiride (AMARYL) 4 MG tablet Take 1 tablet (4 mg total) by mouth daily with breakfast. 90 tablet 1  . levothyroxine (SYNTHROID) 88 MCG tablet Take 1 tablet (88 mcg total) by mouth daily. 90 tablet 3  . lidocaine (LIDODERM) 5 % Place 1 patch onto the skin daily. Remove & Discard patch within 12 hours or as directed by MD 5 patch 0  . linaclotide (LINZESS) 290 MCG CAPS capsule Take 290 mcg by mouth daily before breakfast.    . Multiple Vitamin (MULTIVITAMIN WITH MINERALS) TABS tablet Take 1 tablet by mouth daily.    . nitroGLYCERIN (NITROSTAT) 0.4 MG SL tablet Place 1 tablet (0.4 mg total) under the tongue every 5 (five) minutes as needed for chest pain. 25 tablet 3  . pantoprazole (PROTONIX) 40 MG tablet TAKE ONE (1) TABLET BY MOUTH EVERY DAY 30 tablet 11  . pramipexole (MIRAPEX) 0.25 MG tablet Take 2 tablets (0.5 mg total) by mouth 2 (two) times daily. Take 0.5mg by mouth at 7 PM and 0.5mg at 9 PM daily. 360 tablet 3  . rosuvastatin (CRESTOR) 20 MG tablet Take   1 tablet (20 mg total) by mouth daily. 90 tablet 1  . UNABLE TO FIND Home health supplies for CPAP 1 Product 0  . UNABLE TO FIND [place] sock aid to help with support hose. Size XL.  Dx:I73.9, G25.81, R60.9 1 each 0  . potassium chloride (KLOR-CON) 10 MEQ tablet Take 1 tablet (10 mEq total) by mouth daily. Please schedule  appointment with Dr. Berry 90 tablet 0   No facility-administered medications prior to visit.    Allergies  Allergen Reactions  . Codeine Nausea And Vomiting  . Tramadol Nausea Only    Review of Systems  Constitutional: Negative.   Respiratory: Negative.   Cardiovascular: Negative.   Musculoskeletal: Positive for arthralgias.       Right hip pain causing difficulty with ambulation       Objective:    Physical Exam Constitutional:      Appearance: Normal appearance.  Musculoskeletal:        General: Tenderness present.     Comments: Pain with internal rotation of right hip  Neurological:     Mental Status: He is alert.     BP (!) 110/58   Pulse 70   Temp (!) 97.5 F (36.4 C)   Resp 18   Ht 5' 10" (1.778 m)   Wt 199 lb 9.6 oz (90.5 kg)   SpO2 100%   BMI 28.64 kg/m  Wt Readings from Last 3 Encounters:  02/11/21 199 lb 9.6 oz (90.5 kg)  01/22/21 205 lb (93 kg)  01/07/21 195 lb 9.6 oz (88.7 kg)    Health Maintenance Due  Topic Date Due  . OPHTHALMOLOGY EXAM  02/07/2021    There are no preventive care reminders to display for this patient.   Lab Results  Component Value Date   TSH 6.110 (H) 01/26/2021   Lab Results  Component Value Date   WBC 4.4 01/26/2021   HGB 11.4 (L) 01/26/2021   HCT 33.6 (L) 01/26/2021   MCV 98 (H) 01/26/2021   PLT 196 01/26/2021   Lab Results  Component Value Date   NA 139 01/26/2021   K 3.5 01/26/2021   CO2 28 01/26/2021   GLUCOSE 190 (H) 01/26/2021   BUN 15 01/26/2021   CREATININE 0.85 01/26/2021   BILITOT 0.6 01/26/2021   ALKPHOS 82 01/26/2021   AST 11 01/26/2021   ALT 10 01/26/2021   PROT 6.7 01/26/2021   ALBUMIN 4.3 01/26/2021   CALCIUM 8.9 01/26/2021   ANIONGAP 13 05/01/2020   EGFR 89 01/26/2021   Lab Results  Component Value Date   CHOL 149 01/26/2021   Lab Results  Component Value Date   HDL 46 01/26/2021   Lab Results  Component Value Date   LDLCALC 88 01/26/2021   Lab Results  Component  Value Date   TRIG 78 01/26/2021   Lab Results  Component Value Date   CHOLHDL 2.8 09/25/2020   Lab Results  Component Value Date   HGBA1C 9.6 (H) 01/26/2021       Assessment & Plan:   Problem List Items Addressed This Visit      Other   Right hip pain    -ongoing x 2 weeks; causing difficulty with walking -fell yesterday -referral to ortho      Relevant Orders   Ambulatory referral to Orthopedic Surgery       No orders of the defined types were placed in this encounter.    JOSEPH M GRAY, NP 

## 2021-02-12 ENCOUNTER — Telehealth (HOSPITAL_COMMUNITY): Payer: Self-pay | Admitting: Occupational Therapy

## 2021-02-12 ENCOUNTER — Ambulatory Visit (HOSPITAL_COMMUNITY): Payer: HMO | Admitting: Physical Therapy

## 2021-02-12 NOTE — Telephone Encounter (Signed)
Pt left message to cx appt, states he is not functioning well today.    Guadelupe Sabin, OTR/L  [phone]

## 2021-02-13 ENCOUNTER — Telehealth (HOSPITAL_COMMUNITY): Payer: Self-pay | Admitting: Physical Therapy

## 2021-02-13 NOTE — Telephone Encounter (Signed)
called patient to schedule more apptments l/m

## 2021-02-15 ENCOUNTER — Other Ambulatory Visit: Payer: Self-pay

## 2021-02-15 ENCOUNTER — Observation Stay (HOSPITAL_COMMUNITY)
Admission: EM | Admit: 2021-02-15 | Discharge: 2021-02-17 | Disposition: A | Discharge status: Home / Self Care | Payer: HMO | Attending: Family Medicine | Admitting: Family Medicine

## 2021-02-15 ENCOUNTER — Encounter (HOSPITAL_COMMUNITY): Payer: Self-pay

## 2021-02-15 ENCOUNTER — Emergency Department (HOSPITAL_COMMUNITY): Payer: HMO

## 2021-02-15 DIAGNOSIS — R55 Syncope and collapse: Secondary | ICD-10-CM

## 2021-02-15 DIAGNOSIS — W19XXXA Unspecified fall, initial encounter: Secondary | ICD-10-CM | POA: Diagnosis not present

## 2021-02-15 DIAGNOSIS — E039 Hypothyroidism, unspecified: Secondary | ICD-10-CM | POA: Insufficient documentation

## 2021-02-15 DIAGNOSIS — Z981 Arthrodesis status: Secondary | ICD-10-CM | POA: Diagnosis not present

## 2021-02-15 DIAGNOSIS — Z7984 Long term (current) use of oral hypoglycemic drugs: Secondary | ICD-10-CM | POA: Diagnosis not present

## 2021-02-15 DIAGNOSIS — I509 Heart failure, unspecified: Secondary | ICD-10-CM | POA: Insufficient documentation

## 2021-02-15 DIAGNOSIS — Z79899 Other long term (current) drug therapy: Secondary | ICD-10-CM | POA: Diagnosis not present

## 2021-02-15 DIAGNOSIS — W07XXXA Fall from chair, initial encounter: Secondary | ICD-10-CM | POA: Diagnosis not present

## 2021-02-15 DIAGNOSIS — R296 Repeated falls: Secondary | ICD-10-CM | POA: Insufficient documentation

## 2021-02-15 DIAGNOSIS — Z7902 Long term (current) use of antithrombotics/antiplatelets: Secondary | ICD-10-CM | POA: Insufficient documentation

## 2021-02-15 DIAGNOSIS — S0003XA Contusion of scalp, initial encounter: Secondary | ICD-10-CM | POA: Diagnosis not present

## 2021-02-15 DIAGNOSIS — I951 Orthostatic hypotension: Secondary | ICD-10-CM

## 2021-02-15 DIAGNOSIS — M160 Bilateral primary osteoarthritis of hip: Secondary | ICD-10-CM | POA: Diagnosis not present

## 2021-02-15 DIAGNOSIS — I110 Hypertensive heart disease with heart failure: Secondary | ICD-10-CM | POA: Diagnosis not present

## 2021-02-15 DIAGNOSIS — R0902 Hypoxemia: Secondary | ICD-10-CM | POA: Diagnosis not present

## 2021-02-15 DIAGNOSIS — S0990XA Unspecified injury of head, initial encounter: Principal | ICD-10-CM

## 2021-02-15 DIAGNOSIS — R569 Unspecified convulsions: Secondary | ICD-10-CM

## 2021-02-15 DIAGNOSIS — Z8673 Personal history of transient ischemic attack (TIA), and cerebral infarction without residual deficits: Secondary | ICD-10-CM | POA: Diagnosis not present

## 2021-02-15 DIAGNOSIS — R0689 Other abnormalities of breathing: Secondary | ICD-10-CM | POA: Diagnosis not present

## 2021-02-15 DIAGNOSIS — Z20822 Contact with and (suspected) exposure to covid-19: Secondary | ICD-10-CM | POA: Insufficient documentation

## 2021-02-15 DIAGNOSIS — Z87891 Personal history of nicotine dependence: Secondary | ICD-10-CM | POA: Diagnosis not present

## 2021-02-15 DIAGNOSIS — G4489 Other headache syndrome: Secondary | ICD-10-CM | POA: Diagnosis not present

## 2021-02-15 DIAGNOSIS — R531 Weakness: Secondary | ICD-10-CM | POA: Diagnosis not present

## 2021-02-15 DIAGNOSIS — Z7982 Long term (current) use of aspirin: Secondary | ICD-10-CM | POA: Diagnosis not present

## 2021-02-15 DIAGNOSIS — E119 Type 2 diabetes mellitus without complications: Secondary | ICD-10-CM | POA: Insufficient documentation

## 2021-02-15 DIAGNOSIS — M47812 Spondylosis without myelopathy or radiculopathy, cervical region: Secondary | ICD-10-CM | POA: Diagnosis not present

## 2021-02-15 NOTE — ED Triage Notes (Addendum)
[facility]- pt here for a fall that occurred 2 hours ago. Pt had a knot above left eye. Pt states he does not know if he had LOC. Pt also c/o weakness that has gotten worse over the last week.    Pt has an abrasion on top of head from a fall that happened a week ago.

## 2021-02-15 NOTE — ED Provider Notes (Signed)
[facility] Provider Note   CSN: [id number] Arrival date & time: 02/15/21  2241     History Chief Complaint  Patient presents with  . Fall    [name] is a 75 y.o. male.  Patient presents from home after a fall.  States he fell asleep while sitting in a chair at the dinner table.  He fell forward striking his head on the floor and lost consciousness for a brief moment.  Developed a knot above his left eye.  No vomiting.  Denies any pain elsewhere.  Had a fall about a week ago and sustained an abrasion to the top of his head as well.  Feels generally weak all over. Today he was too weak to get off the floor and called his wife for help. She was able to roll him but not get him up and called EMS.  He denies chest pain, abdominal pain, nausea, vomiting.  Denies any pain with urination or blood in the urine.  Denies any ongoing dizziness or lightheadedness.  Denies any recent illness, nausea or vomiting. Believes her tetanus shot is up-to-date.   The history is provided by the patient and the EMS personnel.  Fall Associated symptoms include headaches. Pertinent negatives include no chest pain, no abdominal pain and no shortness of breath.       Past Medical History:  Diagnosis Date  . Aftercare following surgery of the circulatory system, [place] 12/04/2013  . Angina decubitus ([place]) 05/19/2016  . Arrhythmia 05/20/2016  . Arthritis   . ARTHRITIS, RIGHT FOOT 06/26/2008   Qualifier: Diagnosis of  By: Aline Brochure MD, Dorothyann Peng    . Cardiomyopathy- EF NL 01/2013, now 30-35% echo 03/14/2012  . Carotid artery occlusion    left s/p CEA  . Cellulitis of left foot 05/07/2018  . CHF (congestive heart failure) ([place])   . Critical lower limb ischemia (HCC) 02/28/2014   Critical limb ischemia   . Diabetes mellitus   . Elevated troponin 04/11/2016  . GERD (gastroesophageal reflux disease)   . History of stroke June 2013 03/14/2012  . HOH (hard of hearing)   . Hypertension   .  Hypothyroidism   . Nonhealing skin ulcer ([place]) 10/28/2014  . Obstructive sleep apnea   . Orthostatic hypotension   . Osteomyelitis ([place])    left great toe  . Pain in the chest 05/17/2016  . Peripheral arterial disease ([place]), s/p PTA x 2 RLE    nonhealing ulcers bilaterally on each great toe  . Pneumonia   . PONV (postoperative nausea and vomiting)   . Pulmonary nodule 05/17/2016  . Restless leg syndrome   . Shortness of breath   . Stroke [facility]) March 08, 2012  . Toe osteomyelitis, left ([place])   . Ulcer of great toe, left, with necrosis of bone ([place])   . Wears dentures     Patient Active Problem List   Diagnosis Date Noted  . Right hip pain 02/11/2021  . Erectile dysfunction 01/22/2021  . Gait abnormality 09/30/2020  . Mild non proliferative diabetic retinopathy ([place]) 07/16/2020  . Need for immunization against influenza 06/03/2020  . S/P left knee arthroscopy 05/06/20 05/13/2020  . Abnormal MRI 03/26/2020  . Joint disorder of knee 03/26/2020  . Fall at home, initial encounter 03/13/2020  . Anemia 09/03/2019  . Reflux esophagitis 09/03/2019  . Esophageal dysphagia 05/30/2019  . Constipation 05/30/2019  . Positive colorectal cancer screening using Cologuard test 05/30/2019  . Diabetic foot ulcer ([place]) 09/06/2018  .  Congestive heart failure ([place]) 05/17/2016  . Bilateral carotid artery disease ([place]) 07/08/2014  . Obstructive sleep apnea 02/12/2014  . Restless leg syndrome 10/23/2013  . Overweight with body mass index (BMI) of 29 to 29.9 in adult 10/23/2013  . Hyperlipidemia 07/16/2013  . Peripheral arterial disease ([place]) 07/16/2013  . Hypertension 03/14/2012  . Type 2 diabetes mellitus with circulatory disorder ([place]) 03/14/2012  . Peripheral neuropathy 03/14/2012  . Cardiomyopathy- EF NL 01/2013, now 30-35% echo 03/14/2012    Past Surgical History:  Procedure Laterality Date  . AMPUTATION TOE Left 07/25/2018   Procedure: AMPUTATION TOE INTERPHALANGEAL HALLUX LEFT;  Surgeon:  Evelina Bucy, DPM;  Location: [facility];  Service: Podiatry;  Laterality: Left;  . ANGIOPLASTY  02/28/14   diamond back orbital rotational atherectomy of Rt. tibial  . BACK SURGERY    . BONE BIOPSY Left 07/25/2018   Procedure: SUPERFICIAL BONE BIOPSY;  Surgeon: Evelina Bucy, DPM;  Location: [facility];  Service: Podiatry;  Laterality: Left;  . CARDIAC CATHETERIZATION N/A 05/19/2016   Procedure: Right/Left Heart Cath and Coronary Angiography;  Surgeon: Belva Crome, MD;  Location: [facility] CV LAB;  Service: Cardiovascular;  Laterality: N/A;  . CERVICAL FUSION    . ENDARTERECTOMY Left 11/29/2013   Procedure: ENDARTERECTOMY CAROTID;  Surgeon: Serafina Mitchell, MD;  Location[facility] OR;  Service: Vascular;  Laterality: Left;  . ESOPHAGOGASTRODUODENOSCOPY  02/2010   Dr. Gala Romney: patient presented with food impaction, schatzki ring with superimposed component of stricture with erosive reflux esophagitis, s/p disimpaction but dilation planned at later date   . ESOPHAGOGASTRODUODENOSCOPY (EGD) WITH PROPOFOL N/A 08/13/2019   Dr. Gala Romney: Erosive reflux esophagitis with mild stricture and incidental Mallory-Weiss tear which precluded esophageal dilation.  Medium sized hiatal hernia.  [name] ESOPHAGOGASTRODUODENOSCOPY (EGD) WITH PROPOFOL N/A 10/11/2019   Dr. Gala Romney: Esophageal stenosis status post dilation, moderate hiatal hernia  . EYE SURGERY    . FLEXIBLE SIGMOIDOSCOPY N/A 08/13/2019   Procedure: FLEXIBLE SIGMOIDOSCOPY;  Surgeon: [name], MD;  [facility];  Service: Endoscopy;  Laterality: N/A;  colonoscopy aboerted due to formed stool and poor prep  . FOOT SURGERY    . KNEE ARTHROSCOPY WITH MEDIAL MENISECTOMY Left 05/06/2020   Procedure: KNEE ARTHROSCOPY WITH MEDIAL MENISCECTOMY AND LATERAL MENISCECTOMY;  Surgeon: [name], MD;  Location: [facility] ORS;  Service: Orthopedics;  Laterality: Left;  . Lower ext duplex doppler  03/14/14   Rt ABI 1.2  . LOWER EXTREMITY ANGIOGRAM Bilateral 02/18/2014    Procedure: LOWER EXTREMITY ANGIOGRAM;  Surgeon: [name], MD;  Location[facility] CATH LAB;  Service: Cardiovascular;  Laterality: Bilateral;  . LOWER EXTREMITY ANGIOGRAM N/A 10/31/2014   Procedure: LOWER EXTREMITY ANGIOGRAM;  Surgeon: [name], MD;  Location: [facility] CATH LAB;  Service: Cardiovascular;  Laterality: N/A;  . Venia Minks DILATION N/A 10/11/2019   Procedure: Venia Minks DILATION;  Surgeon: [name], MD;  [facility];  Service: Endoscopy;  Laterality: N/A;  . MULTIPLE TOOTH EXTRACTIONS    . PV angiogram  02/18/2014   tibial vessel diseas bil.  [name] SPINE SURGERY    . tendon achillies lengthing and sesamoid         Family History  Problem Relation Age of Onset  . Heart disease Mother   . Hypertension Mother   . Heart attack Mother   . Hypertension Father   . Diabetes Father   . Diabetes Son   . Heart disease Son   . Hypertension Son   .  Colon cancer Neg Hx     Social History   Tobacco Use  . Smoking status: Former Smoker    Years: 1.00    Types: Pipe    Quit date: 07/08/1977    Years since quitting: 43.6  . Smokeless tobacco: Never Used  Vaping Use  . Vaping Use: Never used  Substance Use Topics  . Alcohol use: No    Alcohol/week: 0.0 standard drinks  . Drug use: No    Home Medications Prior to Admission medications   Medication Sig Start Date End Date Taking? Authorizing Provider  aspirin EC 81 MG EC tablet Take 1 tablet (81 mg total) by mouth daily. Patient taking differently: Take 81 mg by mouth at bedtime. 05/21/16   Barrett, Evelene Croon, PA-C  b complex vitamins tablet Take 1 tablet by mouth daily.    [provider]  carvedilol (COREG) 3.125 MG tablet Take 1 tablet (3.125 mg total) by mouth 2 (two) times daily with a meal. 12/07/19   [name], MD  Cholecalciferol (VITAMIN D3) 5000 units TABS Take 5,000 Units by mouth 2 (two) times daily.     [provider]  clopidogrel (PLAVIX) 75 MG tablet TAKE ONE (1) TABLET BY  MOUTH EVERY DAY 12/03/20   Fayrene Helper, MD  Continuous Blood Gluc Receiver (FREESTYLE LIBRE 14 DAY READER) DEVI  12/07/19   [provider]  Continuous Blood Gluc Sensor (FREESTYLE LIBRE 14 DAY SENSOR) MISC Inject 1 each into the skin every 14 (fourteen) days. 01/22/21   Noreene Larsson, NP  Cyanocobalamin 1500 MCG TBDP Take 1,500 mcg by mouth daily.     [provider]  DULoxetine (CYMBALTA) 60 MG capsule Take 1 capsule (60 mg total) by mouth daily. 09/09/20   Fayrene Helper, MD  empagliflozin (JARDIANCE) 25 MG TABS tablet Take 1 tablet (25 mg total) by mouth daily before breakfast. 09/09/20   Fayrene Helper, MD  furosemide (LASIX) 40 MG tablet TAKE ONE TABLET (40MG  TOTAL) BY MOUTH TWO TIMES DAILY. 07/22/20   [name], MD  glimepiride (AMARYL) 4 MG tablet Take 1 tablet (4 mg total) by mouth daily with breakfast. 09/09/20   Fayrene Helper, MD  levothyroxine (SYNTHROID) 88 MCG tablet Take 1 tablet (88 mcg total) by mouth daily. 01/27/21   Noreene Larsson, NP  lidocaine (LIDODERM) 5 % Place 1 patch onto the skin daily. Remove & Discard patch within 12 hours or as directed by MD 12/12/20   Volanda Napoleon, PA-C  linaclotide (LINZESS) 290 MCG CAPS capsule Take 290 mcg by mouth daily before breakfast.    [provider]  Multiple Vitamin (MULTIVITAMIN WITH MINERALS) TABS tablet Take 1 tablet by mouth daily.    [provider]  nitroGLYCERIN (NITROSTAT) 0.4 MG SL tablet Place 1 tablet (0.4 mg total) under the tongue every 5 (five) minutes as needed for chest pain. 05/22/16   Barrett, Evelene Croon, PA-C  pantoprazole (PROTONIX) 40 MG tablet TAKE ONE (1) TABLET BY MOUTH EVERY DAY 04/10/20   Erenest Rasher, PA-C  potassium chloride (KLOR-CON) 10 MEQ tablet Take 1 tablet (10 mEq total) by mouth daily. Please schedule appointment with Dr. Gwenlyn Found 09/23/20 12/22/20  [name], MD  pramipexole (MIRAPEX) 0.25 MG tablet Take 2 tablets (0.5 mg total) by mouth  2 (two) times daily. Take 0.5mg  by mouth at 7 PM and 0.5mg  at 9 PM daily. 02/25/20   Ward Givens, NP  rosuvastatin (CRESTOR) 20 MG tablet  Take 1 tablet (20 mg total) by mouth daily. 09/09/20   Fayrene Helper, MD  UNABLE TO [place] health supplies for CPAP 09/30/20   Perlie Mayo, NP  UNABLE TO FIND [place] sock aid to help with support hose. Size XL.  Dx:I73.9, G25.81, R60.9 01/14/21   Noreene Larsson, NP    Allergies    Codeine and Tramadol  Review of Systems   Review of Systems  Constitutional: Negative for activity change, appetite change and fever.  HENT: Negative for congestion and rhinorrhea.   Respiratory: Negative for cough, chest tightness and shortness of breath.   Cardiovascular: Negative for chest pain.  Gastrointestinal: Negative for abdominal pain, nausea and vomiting.  Genitourinary: Negative for dysuria and hematuria.  Musculoskeletal: Negative for arthralgias, back pain and myalgias.  Skin: Positive for wound.  Neurological: Positive for weakness and headaches.    all other systems are negative except as noted in the HPI and PMH.  Physical Exam Updated Vital Signs BP (!) 143/79   Pulse 69   Temp 97.9 F (36.6 C) (Oral)   Resp 16   Ht 5\' 10"  (1.778 m)   Wt 92 kg   SpO2 98%   BMI 29.10 kg/m   Physical Exam Vitals and nursing note reviewed.  Constitutional:      General: He is not in acute distress.    Appearance: He is well-developed.  HENT:     Head: Normocephalic.     Comments: Older appearing abrasion to center of scalp. Hematoma to left periorbital region.    Mouth/Throat:     Pharynx: No oropharyngeal exudate.  Eyes:     Conjunctiva/sclera: Conjunctivae normal.     Pupils: Pupils are equal, round, and reactive to light.  Neck:     Comments: No C-spine tenderness Cardiovascular:     Rate and Rhythm: Normal rate and regular rhythm.     Heart sounds: Normal heart sounds. No murmur heard.   Pulmonary:     Effort: Pulmonary effort is  normal. No respiratory distress.     Breath sounds: Normal breath sounds.  Abdominal:     Palpations: Abdomen is soft.     Tenderness: There is no abdominal tenderness. There is no guarding or rebound.  Musculoskeletal:        General: No tenderness. Normal range of motion.     Cervical back: Normal range of motion and neck supple.     Comments: Equal range of motion hips without pain. No T or L-spine tenderness  Skin:    General: Skin is warm.  Neurological:     Mental Status: He is alert and oriented to person, place, and time.     Cranial Nerves: No cranial nerve deficit.     Motor: No abnormal muscle tone.     Coordination: Coordination normal.     Comments: No ataxia on finger to nose bilaterally. No pronator drift. 5/5 strength throughout. CN 2-12 intact.Equal grip strength. Sensation intact.   Psychiatric:        Behavior: Behavior normal.     ED Results / Procedures / Treatments   Labs (all labs ordered are listed, but only abnormal results are displayed) Labs Reviewed  CBC WITH DIFFERENTIAL/PLATELET - Abnormal; Notable for the following components:      Result Value   RBC 3.21 (*)    Hemoglobin 10.6 (*)    HCT 32.1 (*)    All other components within normal limits  COMPREHENSIVE METABOLIC PANEL - Abnormal; Notable  for the following components:   Potassium 3.2 (*)    Calcium 8.8 (*)    All other components within normal limits  URINALYSIS, ROUTINE W REFLEX MICROSCOPIC - Abnormal; Notable for the following components:   APPearance HAZY (*)    All other components within normal limits  CBG MONITORING, ED    EKG EKG Interpretation  Date/Time:  Monday Feb 16 2021 00:55:10 EDT Ventricular Rate:  73 PR Interval:  178 QRS Duration: 100 QT Interval:  436 QTC Calculation: 480 R Axis:   47 Text Interpretation: Normal sinus rhythm Nonspecific T wave abnormality Prolonged QT Abnormal ECG No significant change was found Confirmed by Ezequiel Essex [phone]) on 02/16/2021  1:21:24 AM   Radiology DG Chest 1 View  Result Date: 02/16/2021 CLINICAL DATA:  [place] 2 hours ago, weakness EXAM: CHEST  1 VIEW COMPARISON:  12/12/2020 FINDINGS: Single frontal view of the chest demonstrates a stable cardiac silhouette. There is chronic elevation of the left hemidiaphragm. Stable scarring throughout the lungs without airspace disease, effusion, or pneumothorax. No acute bony abnormalities. IMPRESSION: 1. Stable chest, no acute process. Electronically Signed   By: Randa Ngo M.D.   On: 02/16/2021 00:19   DG Pelvis 1-2 Views  Result Date: 02/16/2021 CLINICAL DATA:  [place], weakness EXAM: PELVIS - 1-2 VIEW COMPARISON:  None. FINDINGS: Supine frontal view of the pelvis demonstrates no acute displaced fractures. Mild symmetrical bilateral hip osteoarthritis. No subluxation or dislocation. Sacroiliac joints are normal. L4-5 discectomy and posterior fusion. IMPRESSION: 1. Symmetrical bilateral hip osteoarthritis.  No acute fracture. Electronically Signed   By: Randa Ngo M.D.   On: 02/16/2021 00:19   CT Head Wo Contrast  Result Date: 02/16/2021 CLINICAL DATA:  [place] 2 hours ago, left supraorbital swelling, weakness EXAM: CT HEAD WITHOUT CONTRAST TECHNIQUE: Contiguous axial images were obtained from the base of the skull through the vertex without intravenous contrast. COMPARISON:  12/12/2020 FINDINGS: Brain: Stable chronic ischemic changes are seen within the right basal ganglia and right frontal periventricular white matter. No acute infarct or hemorrhage. Lateral ventricles and remaining midline structures are unremarkable. No acute extra-axial fluid collections. No mass effect. Vascular: No hyperdense vessel or unexpected calcification. Skull: Small left frontal scalp hematoma. No underlying fracture. The remainder of the calvarium is unremarkable. Sinuses/Orbits: No acute finding. Other: None. IMPRESSION: 1. No acute intracranial process. Stable chronic small vessel ischemic changes.  Electronically Signed   By: Randa Ngo M.D.   On: 02/16/2021 00:15   CT Cervical Spine Wo Contrast  Result Date: 02/16/2021 CLINICAL DATA:  [place] 2 hours ago, worsening weakness EXAM: CT CERVICAL SPINE WITHOUT CONTRAST TECHNIQUE: Multidetector CT imaging of the cervical spine was performed without intravenous contrast. Multiplanar CT image reconstructions were also generated. COMPARISON:  12/12/2020 FINDINGS: Alignment: Alignment is grossly anatomic. Skull base and vertebrae: No acute fracture. No primary bone lesion or focal pathologic process. Soft tissues and spinal canal: No prevertebral fluid or swelling. No visible canal hematoma. Disc levels: Stable ACDF spanning C3-4. Stable multilevel spondylosis most pronounced at C5-6 and C6-7. Ossification of the posterior longitudinal ligament at C4-5. Stable diffuse facet hypertrophy greatest from C2 through C4. Upper chest: Airway is patent.  Lung apices are clear. Other: Reconstructed images demonstrate no additional findings. IMPRESSION: 1. No acute cervical spine fracture. 2. Stable C3-4 ACDF. 3. Stable extensive multilevel cervical spondylosis and facet hypertrophy. Electronically Signed   By: Randa Ngo M.D.   On: 02/16/2021 00:18    Procedures Procedures   Medications Ordered  in ED Medications - No data to display  ED Course  I have reviewed the triage vital signs and the nursing notes.  Pertinent labs & imaging results that were available during my care of the patient were reviewed by me and considered in my medical decision making (see chart for details).    MDM Rules/Calculators/A&P                         Generalized weakness with fall and head injury.  Hematoma to left periorbital region.  Neurovascularly intact  CT head and C-spine are negative.  EKG is sinus rhythm without prolonged QT or B[place].  Labs appear to be at baseline.  Urinalysis is negative.  Patient with significant orthostasis with blood pressure dropping to  the 70s when he attempts to stand up. Unable to ambulate despite assistance.  Appears unsafe to go home and has had multiple recurrent falls. Significant orthostasis with standing.  Will hydrate and plan admission for further evaluation of syncope and PT evaluation.  D/w Dr. Clearence Ped.  Final Clinical Impression(s) / ED Diagnoses Final diagnoses:  Syncope, unspecified syncope type  Minor head injury, initial encounter  Orthostasis    Rx / DC Orders ED Discharge Orders    None       Oberon Hehir, Annie Main, MD 02/16/21 [phone]

## 2021-02-16 ENCOUNTER — Other Ambulatory Visit (HOSPITAL_COMMUNITY): Payer: Self-pay | Admitting: *Deleted

## 2021-02-16 ENCOUNTER — Encounter (HOSPITAL_COMMUNITY): Payer: Self-pay | Admitting: Family Medicine

## 2021-02-16 ENCOUNTER — Observation Stay (HOSPITAL_BASED_OUTPATIENT_CLINIC_OR_DEPARTMENT_OTHER): Payer: HMO

## 2021-02-16 ENCOUNTER — Other Ambulatory Visit: Payer: Self-pay

## 2021-02-16 DIAGNOSIS — I351 Nonrheumatic aortic (valve) insufficiency: Secondary | ICD-10-CM | POA: Diagnosis not present

## 2021-02-16 DIAGNOSIS — E1111 Type 2 diabetes mellitus with ketoacidosis with coma: Secondary | ICD-10-CM

## 2021-02-16 DIAGNOSIS — E876 Hypokalemia: Secondary | ICD-10-CM | POA: Diagnosis not present

## 2021-02-16 DIAGNOSIS — R55 Syncope and collapse: Secondary | ICD-10-CM | POA: Diagnosis not present

## 2021-02-16 DIAGNOSIS — E039 Hypothyroidism, unspecified: Secondary | ICD-10-CM

## 2021-02-16 DIAGNOSIS — M47812 Spondylosis without myelopathy or radiculopathy, cervical region: Secondary | ICD-10-CM | POA: Diagnosis not present

## 2021-02-16 DIAGNOSIS — S0003XA Contusion of scalp, initial encounter: Secondary | ICD-10-CM | POA: Diagnosis not present

## 2021-02-16 DIAGNOSIS — R531 Weakness: Secondary | ICD-10-CM | POA: Diagnosis not present

## 2021-02-16 DIAGNOSIS — R569 Unspecified convulsions: Secondary | ICD-10-CM

## 2021-02-16 DIAGNOSIS — M160 Bilateral primary osteoarthritis of hip: Secondary | ICD-10-CM | POA: Diagnosis not present

## 2021-02-16 DIAGNOSIS — Z981 Arthrodesis status: Secondary | ICD-10-CM | POA: Diagnosis not present

## 2021-02-16 DIAGNOSIS — S0990XA Unspecified injury of head, initial encounter: Secondary | ICD-10-CM

## 2021-02-16 DIAGNOSIS — I951 Orthostatic hypotension: Secondary | ICD-10-CM

## 2021-02-16 LAB — CBC WITH DIFFERENTIAL/PLATELET
Abs Immature Granulocytes: 0.02 10*3/uL (ref 0.00–0.07)
Basophils Absolute: 0 10*3/uL (ref 0.0–0.1)
Basophils Relative: 1 %
Eosinophils Absolute: 0.2 10*3/uL (ref 0.0–0.5)
Eosinophils Relative: 2 %
HCT: 32.1 % — ABNORMAL LOW (ref 39.0–52.0)
Hemoglobin: 10.6 g/dL — ABNORMAL LOW (ref 13.0–17.0)
Immature Granulocytes: 0 %
Lymphocytes Relative: 13 %
Lymphs Abs: 1.2 10*3/uL (ref 0.7–4.0)
MCH: 33 pg (ref 26.0–34.0)
MCHC: 33 g/dL (ref 30.0–36.0)
MCV: 100 fL (ref 80.0–100.0)
Monocytes Absolute: 0.5 10*3/uL (ref 0.1–1.0)
Monocytes Relative: 6 %
Neutro Abs: 6.8 10*3/uL (ref 1.7–7.7)
Neutrophils Relative %: 78 %
Platelets: 225 10*3/uL (ref 150–400)
RBC: 3.21 MIL/uL — ABNORMAL LOW (ref 4.22–5.81)
RDW: 14.6 % (ref 11.5–15.5)
WBC: 8.7 10*3/uL (ref 4.0–10.5)
nRBC: 0 % (ref 0.0–0.2)

## 2021-02-16 LAB — URINALYSIS, ROUTINE W REFLEX MICROSCOPIC
Bilirubin Urine: NEGATIVE
Glucose, UA: NEGATIVE mg/dL
Hgb urine dipstick: NEGATIVE
Ketones, ur: NEGATIVE mg/dL
Leukocytes,Ua: NEGATIVE
Nitrite: NEGATIVE
Protein, ur: NEGATIVE mg/dL
Specific Gravity, Urine: 1.011 (ref 1.005–1.030)
pH: 6 (ref 5.0–8.0)

## 2021-02-16 LAB — COMPREHENSIVE METABOLIC PANEL
ALT: 16 U/L (ref 0–44)
AST: 18 U/L (ref 15–41)
Albumin: 4 g/dL (ref 3.5–5.0)
Alkaline Phosphatase: 67 U/L (ref 38–126)
Anion gap: 9 (ref 5–15)
BUN: 17 mg/dL (ref 8–23)
CO2: 29 mmol/L (ref 22–32)
Calcium: 8.8 mg/dL — ABNORMAL LOW (ref 8.9–10.3)
Chloride: 98 mmol/L (ref 98–111)
Creatinine, Ser: 0.87 mg/dL (ref 0.61–1.24)
GFR, Estimated: >60 mL/min (ref >60)
Glucose, Bld: 94 mg/dL (ref 70–99)
Potassium: 3.2 mmol/L — ABNORMAL LOW (ref 3.5–5.1)
Sodium: 136 mmol/L (ref 135–145)
Total Bilirubin: 0.9 mg/dL (ref 0.3–1.2)
Total Protein: 7.3 g/dL (ref 6.5–8.1)

## 2021-02-16 LAB — RESP PANEL BY RT-PCR (FLU A&B, COVID) ARPGX2
Influenza A by PCR: NEGATIVE
Influenza B by PCR: NEGATIVE
SARS Coronavirus 2 by RT PCR: NEGATIVE

## 2021-02-16 LAB — ECHOCARDIOGRAM COMPLETE
Area-P 1/2: 4.65 cm2
Height: 70 in
P 1/2 time: 337 msec
S' Lateral: 3.9 cm
Weight: 3185.21 oz

## 2021-02-16 LAB — GLUCOSE, CAPILLARY
Glucose-Capillary: 157 mg/dL — ABNORMAL HIGH (ref 70–99)
Glucose-Capillary: 151 mg/dL — ABNORMAL HIGH (ref 70–99)
Glucose-Capillary: 138 mg/dL — ABNORMAL HIGH (ref 70–99)
Glucose-Capillary: 141 mg/dL — ABNORMAL HIGH (ref 70–99)
Glucose-Capillary: 205 mg/dL — ABNORMAL HIGH (ref 70–99)

## 2021-02-16 LAB — CBG MONITORING, ED: Glucose-Capillary: 98 mg/dL (ref 70–99)

## 2021-02-16 MED ORDER — PRAMIPEXOLE DIHYDROCHLORIDE 1 MG PO TABS
0.5000 mg | ORAL_TABLET | Freq: Two times a day (BID) | ORAL | Status: DC
  Administered 2021-02-16 – 2021-02-17 (×3): 0.5 mg via ORAL
  Filled 2021-02-16 (×3): qty 1

## 2021-02-16 MED ORDER — ACETAMINOPHEN 650 MG RE SUPP: 650.0000 mg | Freq: Four times a day (QID) | RECTAL | Status: DC | PRN

## 2021-02-16 MED ORDER — CARVEDILOL 3.125 MG PO TABS
3.1250 mg | ORAL_TABLET | Freq: Two times a day (BID) | ORAL | Status: DC
  Administered 2021-02-16 – 2021-02-17 (×4): 3.125 mg via ORAL
  Filled 2021-02-16 (×4): qty 1

## 2021-02-16 MED ORDER — ONDANSETRON HCL 4 MG/2ML IJ SOLN: 4.0000 mg | Freq: Four times a day (QID) | INTRAMUSCULAR | Status: DC | PRN

## 2021-02-16 MED ORDER — POTASSIUM CHLORIDE 20 MEQ PO PACK
40.0000 meq | PACK | Freq: Once | ORAL | Status: AC
  Administered 2021-02-16: 40 meq via ORAL
  Filled 2021-02-16: qty 2

## 2021-02-16 MED ORDER — LEVOTHYROXINE SODIUM 88 MCG PO TABS
88.0000 ug | ORAL_TABLET | Freq: Every day | ORAL | Status: DC
  Administered 2021-02-16 – 2021-02-17 (×2): 88 ug via ORAL
  Filled 2021-02-16 (×2): qty 1

## 2021-02-16 MED ORDER — LEVOTHYROXINE SODIUM 88 MCG PO TABS: 88.0000 ug | ORAL_TABLET | Freq: Every day | ORAL | Status: DC

## 2021-02-16 MED ORDER — HEPARIN SODIUM (PORCINE) 5000 UNIT/ML IJ SOLN
5000.0000 [IU] | Freq: Three times a day (TID) | INTRAMUSCULAR | Status: DC
  Administered 2021-02-16 – 2021-02-17 (×5): 5000 [IU] via SUBCUTANEOUS
  Filled 2021-02-16 (×5): qty 1

## 2021-02-16 MED ORDER — ONDANSETRON HCL 4 MG PO TABS: 4.0000 mg | ORAL_TABLET | Freq: Four times a day (QID) | ORAL | Status: DC | PRN

## 2021-02-16 MED ORDER — PANTOPRAZOLE SODIUM 40 MG PO TBEC
40.0000 mg | DELAYED_RELEASE_TABLET | Freq: Every day | ORAL | Status: DC
  Administered 2021-02-16 – 2021-02-17 (×2): 40 mg via ORAL
  Filled 2021-02-16 (×2): qty 1

## 2021-02-16 MED ORDER — INSULIN ASPART 100 UNIT/ML IJ SOLN
0.0000 [IU] | Freq: Every day | INTRAMUSCULAR | Status: DC
Start: 2021-02-16 — End: 2021-02-17
  Administered 2021-02-16: 2 [IU] via SUBCUTANEOUS

## 2021-02-16 MED ORDER — ACETAMINOPHEN 325 MG PO TABS: 650.0000 mg | ORAL_TABLET | Freq: Four times a day (QID) | ORAL | Status: DC | PRN

## 2021-02-16 MED ORDER — CLOPIDOGREL BISULFATE 75 MG PO TABS
75.0000 mg | ORAL_TABLET | Freq: Every day | ORAL | Status: DC
  Administered 2021-02-16 – 2021-02-17 (×2): 75 mg via ORAL
  Filled 2021-02-16 (×2): qty 1

## 2021-02-16 MED ORDER — NITROGLYCERIN 0.4 MG SL SUBL: 0.4000 mg | SUBLINGUAL_TABLET | SUBLINGUAL | Status: DC | PRN

## 2021-02-16 MED ORDER — SODIUM CHLORIDE 0.9 % IV SOLN: INTRAVENOUS | Status: DC

## 2021-02-16 MED ORDER — ASPIRIN EC 81 MG PO TBEC
81.0000 mg | DELAYED_RELEASE_TABLET | Freq: Every day | ORAL | Status: DC
  Administered 2021-02-16: 81 mg via ORAL
  Filled 2021-02-16: qty 1

## 2021-02-16 MED ORDER — ROSUVASTATIN CALCIUM 20 MG PO TABS
20.0000 mg | ORAL_TABLET | Freq: Every day | ORAL | Status: DC
  Administered 2021-02-16 – 2021-02-17 (×2): 20 mg via ORAL
  Filled 2021-02-16 (×2): qty 1

## 2021-02-16 MED ORDER — SODIUM CHLORIDE 0.9% FLUSH: 3.0000 mL | Freq: Two times a day (BID) | INTRAVENOUS | Status: DC

## 2021-02-16 MED ORDER — OXYCODONE HCL 5 MG PO TABS
5.0000 mg | ORAL_TABLET | ORAL | Status: DC | PRN
  Administered 2021-02-16: 5 mg via ORAL
  Filled 2021-02-16: qty 1

## 2021-02-16 MED ORDER — DULOXETINE HCL 60 MG PO CPEP
60.0000 mg | ORAL_CAPSULE | Freq: Every day | ORAL | Status: DC
  Administered 2021-02-16 – 2021-02-17 (×2): 60 mg via ORAL
  Filled 2021-02-16 (×2): qty 1

## 2021-02-16 MED ORDER — SODIUM CHLORIDE 0.9 % IV BOLUS
1000.0000 mL | Freq: Once | INTRAVENOUS | Status: AC
  Administered 2021-02-16: 1000 mL via INTRAVENOUS

## 2021-02-16 MED ORDER — INSULIN ASPART 100 UNIT/ML IJ SOLN
0.0000 [IU] | Freq: Three times a day (TID) | INTRAMUSCULAR | Status: DC
  Administered 2021-02-16 (×2): 2 [IU] via SUBCUTANEOUS
  Administered 2021-02-16: 3 [IU] via SUBCUTANEOUS
  Administered 2021-02-17: 2 [IU] via SUBCUTANEOUS
  Administered 2021-02-17: 3 [IU] via SUBCUTANEOUS

## 2021-02-16 NOTE — Evaluation (Signed)
Physical Therapy Evaluation Patient Details Name: [name] [name] MRN: [id number] DOB: 1945-11-06 Today's Date: 02/16/2021   History of Present Illness  [name] [name]  is a 75 y.o. male, with history of stroke, restless leg, orthostatic hypotension, obstructive sleep apnea, hypothyroidism, hypertension, GERD, diabetes mellitus, CHF, and more presents ED with a chief complaint of syncope.  Patient reports that he had just finished his meal, was sitting in a chair at the table, when he woke up on the floor.  He thinks he fell asleep at the table and then fell onto the floor.  He does report loss of consciousness and that he hit his head.  He could not get up on his own due to generalized weakness so he called out to his wife.  Wife was able to roll him over onto his belly, and then patient had to crawl across the room to a railing to get up.  He reports that he felt extremely weak, like he can [name] push off with his arms.  He would did not have asymmetrical weakness.  Patient did not have chest pain when this happened.  He does report that for 2 hours he remained kind of dizzy and confused.  He was aware of what was going on but felt like it was surreal.  Patient does report recently he has been having frequent falls because of arthritis in his hips.  He thinks he has been getting weaker and weaker over 2 years.  He has had multiple falls in the past couple of weeks.  Patient does not have a history of hypoglycemic episodes, and reports that this did not feel like a hypoglycemic episode.  He does not admit to chest pain, palpitations, diaphoresis, nausea or vomiting.  He has had an intermittent cough productive of clear sputum that is chronic.  He denies any fevers.    Clinical Impression  Patient limited for functional mobility as stated below secondary to BLE weakness, fatigue and poor standing balance. Patient completes all mobility with slow, labored movements today. Patient requires mod assist to pull  to seated EOB and demonstrates good sitting balance with feet supported. Patient requires mod assist to transfer with RW to [facility]. He demonstrates good sitting balance and sitting tolerance on BSC. Patient transfers to standing with RW and he demonstrates impaired standing balance. He ambulates to recliner with slow, labored, shuffled steps at bedside and ends session seated in chair - RN/NT aware. Patient will benefit from continued physical therapy in hospital and recommended venue below to increase strength, balance, endurance for safe ADLs and gait.     Follow Up Recommendations SNF    Equipment Recommendations  None recommended by PT    Recommendations for Other Services       Precautions / Restrictions Precautions Precautions: Fall Restrictions Weight Bearing Restrictions: No      Mobility  Bed Mobility Overal bed mobility: Needs Assistance Bed Mobility: Supine to Sit     Supine to sit: Mod assist     General bed mobility comments: slow, labored transition to seated EOB requiring assist to pull to seated    Transfers Overall transfer level: Needs assistance Equipment used: Rolling walker (2 wheeled) Transfers: Sit to/from [facility] Sit to Stand: Mod assist Stand pivot transfers: Mod assist       General transfer comment: stand/pivot transfer with RW from bed to [facility], STS from [facility] with RW  Ambulation/Gait Ambulation/Gait assistance: Mod assist Gait Distance (Feet): 3 Feet Assistive device: Rolling walker (  2 wheeled) Gait Pattern/deviations: Shuffle Gait velocity: decreased   General Gait Details: slow, labored, unsteady cadence with RW to ambulate from [facility] to recliner  Stairs            Wheelchair Mobility    Modified Rankin (Stroke Patients Only)       Balance Overall balance assessment: Needs assistance Sitting-balance support: Feet supported Sitting balance-Leahy Scale: Good Sitting balance - Comments: seated  EOB and BSC    Standing balance support: Bilateral upper extremity supported Standing balance-Leahy Scale: Poor Standing balance comment: fair/poor with RW                             Pertinent Vitals/Pain Pain Assessment: No/denies pain    Home Living Family/patient expects to be discharged to:: Private residence Living Arrangements: Spouse/significant other Available Help at Discharge: Family (wife unable to provide much assist as she recently had heart surgery) Type of Home: House Home Access: Stairs to enter   [employer] of Steps: 2 Home Layout: Multi-level Home Equipment: Walker - 2 wheels;Cane - single point;Tub bench      Prior Function Level of Independence: Needs assistance   Gait / Transfers Assistance Needed: household ambulator with RW or SPC, frequent falls  ADL's / Homemaking Assistance Needed: independent with basic ADL        Hand Dominance        Extremity/Trunk Assessment   Upper Extremity Assessment Upper Extremity Assessment: Generalized weakness    Lower Extremity Assessment Lower Extremity Assessment: Generalized weakness    Cervical / Trunk Assessment Cervical / Trunk Assessment: Kyphotic  Communication   Communication: No difficulties  Cognition Arousal/Alertness: Awake/alert Behavior During Therapy: WFL for tasks assessed/performed Overall Cognitive Status: Within Functional Limits for tasks assessed                                        General Comments      Exercises     Assessment/Plan    PT Assessment Patient needs continued PT services  PT Problem List Decreased strength;Decreased mobility;Decreased safety awareness;Decreased activity tolerance;Decreased balance;Decreased knowledge of use of DME       PT Treatment Interventions DME instruction;Therapeutic exercise;Gait training;Balance training;Stair training;Neuromuscular re-education;Functional mobility training;Therapeutic  activities;Patient/family education    PT Goals (Current goals can be found in the Care Plan section)  Acute Rehab PT Goals Patient Stated Goal: to to rehab to get stronger PT Goal Formulation: With patient Time For Goal Achievement: 03/02/21 Potential to Achieve Goals: Fair    Frequency Min 3X/week   Barriers to discharge        Co-evaluation               AM-PAC PT "6 Clicks" Mobility  Outcome Measure Help needed turning from your back to your side while in a flat bed without using bedrails?: A Little Help needed moving from lying on your back to sitting on the side of a flat bed without using bedrails?: A Lot Help needed moving to and from a bed to a chair (including a wheelchair)?: A Lot Help needed standing up from a chair using your arms (e.g., wheelchair or bedside chair)?: A Lot Help needed to walk in hospital room?: A Lot Help needed climbing 3-5 steps with a railing? : Total 6 Click Score: 12    End of Session Equipment Utilized During  Treatment: Gait belt Activity Tolerance: Patient tolerated treatment well;Patient limited by fatigue Patient left: in chair;with call bell/phone within reach Nurse Communication: Mobility status PT Visit Diagnosis: Unsteadiness on feet (R26.81);Other abnormalities of gait and mobility (R26.89);Repeated falls (R29.6);Muscle weakness (generalized) (M62.81);History of falling (Z91.81)    Time: 0998-3382 PT Time Calculation (min) (ACUTE ONLY): 36 min   Charges:   PT Evaluation $PT Eval Low Complexity: 1 Low PT Treatments $Therapeutic Activity: 23-37 mins       11:56 AM, 02/16/21 Mearl Latin PT, DPT Physical Therapist at [facility]

## 2021-02-16 NOTE — ED Notes (Signed)
Pt unable to ambulate. 

## 2021-02-16 NOTE — NC FL2 (Signed)
[place] LEVEL OF CARE SCREENING TOOL     IDENTIFICATION  Patient Name: [name] Birthdate: 14-Sep-1946 Sex: male Admission Date (Current Location): 02/15/2021  [facility] and [place] Number:  [employer] and Address:  [facility] [address] [place]      Provider Number: [id number]  Attending Physician Name and Address:  Murlean Iba, MD  Relative Name and Phone Number:  Daimien, Patmon (Spouse)   [phone] (Mobile)    Current Level of Care: Other (Comment) (OBSERVATION) Recommended Level of Care: [place] Prior Approval Number:    Date Approved/Denied:   PASRR Number: [id number]  Discharge Plan: SNF    Current Diagnoses: Patient Active Problem List   Diagnosis Date Noted  . Syncope 02/16/2021  . Right hip pain 02/11/2021  . Erectile dysfunction 01/22/2021  . Gait abnormality 09/30/2020  . Mild non proliferative diabetic retinopathy ([place]) 07/16/2020  . Need for immunization against influenza 06/03/2020  . S/P left knee arthroscopy 05/06/20 05/13/2020  . Abnormal MRI 03/26/2020  . Joint disorder of knee 03/26/2020  . Fall at home, initial encounter 03/13/2020  . Anemia 09/03/2019  . Reflux esophagitis 09/03/2019  . Esophageal dysphagia 05/30/2019  . Constipation 05/30/2019  . Positive colorectal cancer screening using Cologuard test 05/30/2019  . Diabetic foot ulcer ([place]) 09/06/2018  . Congestive heart failure ([place]) 05/17/2016  . Bilateral carotid artery disease ([place]) 07/08/2014  . Obstructive sleep apnea 02/12/2014  . Restless leg syndrome 10/23/2013  . Overweight with body mass index (BMI) of 29 to 29.9 in adult 10/23/2013  . Hyperlipidemia 07/16/2013  . Peripheral arterial disease ([place]) 07/16/2013  . Hypertension 03/14/2012  . Type 2 diabetes mellitus with circulatory disorder ([place]) 03/14/2012  . Peripheral neuropathy 03/14/2012  . Cardiomyopathy- EF NL 01/2013, now 30-35% echo  03/14/2012    Orientation RESPIRATION BLADDER Height & Weight     Self,Time,Situation,Place  Normal Continent Weight: 199 lb 1.2 oz (90.3 kg) Height:  5\' 10"  (177.8 cm)  BEHAVIORAL SYMPTOMS/MOOD NEUROLOGICAL BOWEL NUTRITION STATUS      Continent Diet (heart healthy/carb modified)  AMBULATORY STATUS COMMUNICATION OF NEEDS Skin   Extensive Assist Verbally Normal                       Personal Care Assistance Level of Assistance  Bathing,Dressing,Feeding Bathing Assistance: Limited assistance Feeding assistance: Independent Dressing Assistance: Limited assistance     Functional Limitations Info  Sight,Hearing,Speech Sight Info: Adequate Hearing Info: Adequate Speech Info: Adequate    SPECIAL CARE FACTORS FREQUENCY  PT (By licensed PT)     PT Frequency: 5x/week              Contractures Contractures Info: Not present    Additional Factors Info  Code Status,Allergies Code Status Info: Full code Allergies Info: Codeine, tramadol           Current Medications (02/16/2021):  This is the current hospital active medication list Current Facility-Administered Medications  Medication Dose Route Frequency Provider Last Rate Last Admin  . 0.9 %  sodium chloride infusion   Intravenous Continuous Zierle-Ghosh, Asia B, DO 100 mL/hr at 02/16/21 0627 New Bag at 02/16/21 3710  . acetaminophen (TYLENOL) tablet 650 mg  650 mg Oral Q6H PRN Zierle-Ghosh, Asia B, DO       Or  . acetaminophen (TYLENOL) suppository 650 mg  650 mg Rectal Q6H PRN Zierle-Ghosh, Asia B, DO      . aspirin EC  tablet 81 mg  81 mg Oral QHS Zierle-Ghosh, Asia B, DO      . carvedilol (COREG) tablet 3.125 mg  3.125 mg Oral BID WC Zierle-Ghosh, Asia B, DO   3.125 mg at 02/16/21 0845  . clopidogrel (PLAVIX) tablet 75 mg  75 mg Oral Daily Zierle-Ghosh, Asia B, DO   75 mg at 02/16/21 0845  . DULoxetine (CYMBALTA) DR capsule 60 mg  60 mg Oral Daily Zierle-Ghosh, Asia B, DO   60 mg at 02/16/21 0845  . heparin  injection 5,000 Units  5,000 Units Subcutaneous Q8H Zierle-Ghosh, Asia B, DO   5,000 Units at 02/16/21 1448  . insulin aspart (novoLOG) injection 0-15 Units  0-15 Units Subcutaneous TID WC Zierle-Ghosh, Asia B, DO   2 Units at 02/16/21 1246  . insulin aspart (novoLOG) injection 0-5 Units  0-5 Units Subcutaneous QHS Zierle-Ghosh, Asia B, DO      . levothyroxine (SYNTHROID) tablet 88 mcg  88 mcg Oral Q0600 Johnson, Clanford L, MD   88 mcg at 02/16/21 0630  . ondansetron (ZOFRAN) tablet 4 mg  4 mg Oral Q6H PRN Zierle-Ghosh, Asia B, DO       Or  . ondansetron (ZOFRAN) injection 4 mg  4 mg Intravenous Q6H PRN Zierle-Ghosh, Asia B, DO      . oxyCODONE (Oxy IR/ROXICODONE) immediate release tablet 5 mg  5 mg Oral Q4H PRN Zierle-Ghosh, Asia B, DO      . pantoprazole (PROTONIX) EC tablet 40 mg  40 mg Oral Daily Zierle-Ghosh, Asia B, DO   40 mg at 02/16/21 0845  . rosuvastatin (CRESTOR) tablet 20 mg  20 mg Oral Daily Zierle-Ghosh, Asia B, DO   20 mg at 02/16/21 0845  . sodium chloride flush (NS) 0.9 % injection 3 mL  3 mL Intravenous Q12H Zierle-Ghosh, Asia B, DO         Discharge Medications: Please see discharge summary for a list of discharge medications.  Relevant Imaging Results:  Relevant Lab Results:   Additional Information SSN [id number] Clydene Pugh, LCSW

## 2021-02-16 NOTE — Progress Notes (Signed)
*  PRELIMINARY RESULTS* Echocardiogram 2D Echocardiogram has been performed.  Samuel Germany 02/16/2021, 11:52 AM

## 2021-02-16 NOTE — H&P (Signed)
TRH H&P    Patient Demographics:    [name], is a 75 y.o. male  MRN: [id number]  DOB - 1946-07-24  Admit Date - 02/15/2021  Referring MD/NP/PA: Rancour  Outpatient Primary MD for the patient is Noreene Larsson, NP  Patient coming from: Home  Chief complaint- syncope   HPI:    [name]  is a 75 y.o. male, with history of stroke, restless leg, orthostatic hypotension, obstructive sleep apnea, hypothyroidism, hypertension, GERD, diabetes mellitus, CHF, and more presents ED with a chief complaint of syncope.  Patient reports that he had just finished his meal, was sitting in a chair at the table, when he woke up on the floor.  He thinks he fell asleep at the table and then fell onto the floor.  He does report loss of consciousness and that he hit his head.  He could not get up on his own due to generalized weakness so he called out to his wife.  Wife was able to roll him over onto his belly, and then patient had to crawl across the room to a railing to get up.  He reports that he felt extremely weak, like he can even push off with his arms.  He would did not have asymmetrical weakness.  Patient did not have chest pain when this happened.  He does report that for 2 hours he remained kind of dizzy and confused.  He was aware of what was going on but felt like it was surreal.  Patient does report recently he has been having frequent falls because of arthritis in his hips.  He thinks he has been getting weaker and weaker over 2 years.  He has had multiple falls in the past couple of weeks.  Patient does not have a history of hypoglycemic episodes, and reports that this did not feel like a hypoglycemic episode.  He does not admit to chest pain, palpitations, diaphoresis, nausea or vomiting.  He has had an intermittent cough productive of clear sputum that is chronic.  He denies any fevers.  Patient does not smoke, does  not drink alcohol, does not use illicit drugs.  Patient is vaccinated for COVID.  Patient is full code.  In the ED Temp 97.9, heart rate 69-75, blood pressure 134/81, dropping down into the 70s upon standing, satting at 95% UA is negative for UTI CT cervical spine and head showed no acute cervical spine fracture, no acute intracranial process. Chest x-ray shows stable chest no acute process X-ray pelvis shows symmetrical bilateral hip arthritis with no acute fracture EKG shows a heart rate of 73, QTc 480, sinus rhythm No leukocytosis with a white blood cell count of 8.7, hemoglobin 10.6 Mild hypokalemia at 3.2 Admission requested for further work-up syncope    Review of systems:    In addition to the HPI above,  No Fever-chills, No Headache, No changes with Vision or hearing, No problems swallowing food or Liquids, No Chest pain, Cough or Shortness of Breath, No Abdominal pain, No Nausea or Vomiting, no change in  bowel habits No Blood in stool or Urine, No dysuria, No new joints pains-aches,  No new weakness, tingling, numbness in any extremity, No recent weight gain or loss, No polyuria, polydypsia or polyphagia, No significant Mental Stressors.  All other systems reviewed and are negative.    Past History of the following :    Past Medical History:  Diagnosis Date  . Aftercare following surgery of the circulatory system, [place] 12/04/2013  . Angina decubitus ([place]) 05/19/2016  . Arrhythmia 05/20/2016  . Arthritis   . ARTHRITIS, RIGHT FOOT 06/26/2008   Qualifier: Diagnosis of  By: Aline Brochure MD, Dorothyann Peng    . Cardiomyopathy- EF NL 01/2013, now 30-35% echo 03/14/2012  . Carotid artery occlusion    left s/p CEA  . Cellulitis of left foot 05/07/2018  . CHF (congestive heart failure) ([place])   . Critical lower limb ischemia (HCC) 02/28/2014   Critical limb ischemia   . Diabetes mellitus   . Elevated troponin 04/11/2016  . GERD (gastroesophageal reflux disease)   . History of stroke  June 2013 03/14/2012  . HOH (hard of hearing)   . Hypertension   . Hypothyroidism   . Nonhealing skin ulcer ([place]) 10/28/2014  . Obstructive sleep apnea   . Orthostatic hypotension   . Osteomyelitis ([place])    left great toe  . Pain in the chest 05/17/2016  . Peripheral arterial disease ([place]), s/p PTA x 2 RLE    nonhealing ulcers bilaterally on each great toe  . Pneumonia   . PONV (postoperative nausea and vomiting)   . Pulmonary nodule 05/17/2016  . Restless leg syndrome   . Shortness of breath   . Stroke [facility]) March 08, 2012  . Toe osteomyelitis, left ([place])   . Ulcer of great toe, left, with necrosis of bone ([place])   . Wears dentures       Past Surgical History:  Procedure Laterality Date  . AMPUTATION TOE Left 07/25/2018   Procedure: AMPUTATION TOE INTERPHALANGEAL HALLUX LEFT;  Surgeon: Evelina Bucy, DPM;  Location: [place];  Service: Podiatry;  Laterality: Left;  . ANGIOPLASTY  02/28/14   diamond back orbital rotational atherectomy of Rt. tibial  . BACK SURGERY    . BONE BIOPSY Left 07/25/2018   Procedure: SUPERFICIAL BONE BIOPSY;  Surgeon: Evelina Bucy, DPM;  Location: [place];  Service: Podiatry;  Laterality: Left;  . CARDIAC CATHETERIZATION N/A 05/19/2016   Procedure: Right/Left Heart Cath and Coronary Angiography;  Surgeon: Belva Crome, MD;  Location: [place] CV LAB;  Service: Cardiovascular;  Laterality: N/A;  . CERVICAL FUSION    . ENDARTERECTOMY Left 11/29/2013   Procedure: ENDARTERECTOMY CAROTID;  Surgeon: Serafina Mitchell, MD;  Location[facility] OR;  Service: Vascular;  Laterality: Left;  . ESOPHAGOGASTRODUODENOSCOPY  02/2010   Dr. Gala Romney: patient presented with food impaction, schatzki ring with superimposed component of stricture with erosive reflux esophagitis, s/p disimpaction but dilation planned at later date   . ESOPHAGOGASTRODUODENOSCOPY (EGD) WITH PROPOFOL N/A 08/13/2019   Dr. Gala Romney: Erosive reflux esophagitis with mild stricture and incidental Mallory-Weiss tear  which precluded esophageal dilation.  Medium sized hiatal hernia.  [name] ESOPHAGOGASTRODUODENOSCOPY (EGD) WITH PROPOFOL N/A 10/11/2019   Dr. Gala Romney: Esophageal stenosis status post dilation, moderate hiatal hernia  . EYE SURGERY    . FLEXIBLE SIGMOIDOSCOPY N/A 08/13/2019   Procedure: FLEXIBLE SIGMOIDOSCOPY;  Surgeon: [name], MD;  [facility];  Service: Endoscopy;  Laterality: N/A;  colonoscopy aboerted due to formed stool and poor  prep  . FOOT SURGERY    . KNEE ARTHROSCOPY WITH MEDIAL MENISECTOMY Left 05/06/2020   Procedure: KNEE ARTHROSCOPY WITH MEDIAL MENISCECTOMY AND LATERAL MENISCECTOMY;  Surgeon: [name], MD;  Location: [facility] OR[facility];  Service: Orthopedics;  Laterality: Left;  . Lower ext duplex doppler  03/14/14   Rt ABI 1.2  . LOWER EXTREMITY ANGIOGRAM Bilateral 02/18/2014   Procedure: LOWER EXTREMITY ANGIOGRAM;  Surgeon: [name], MD;  Location[facility] CATH LAB;  Service: Cardiovascular;  Laterality: Bilateral;  . LOWER EXTREMITY ANGIOGRAM N/A 10/31/2014   Procedure: LOWER EXTREMITY ANGIOGRAM;  Surgeon: [name], MD;  Location: [facility] CATH LAB;  Service: Cardiovascular;  Laterality: N/A;  . Venia Minks DILATION N/A 10/11/2019   Procedure: Venia Minks DILATION;  Surgeon: [name], MD;  [facility];  Service: Endoscopy;  Laterality: N/A;  . MULTIPLE TOOTH EXTRACTIONS    . PV angiogram  02/18/2014   tibial vessel diseas bil.  [name] SPINE SURGERY    . tendon achillies lengthing and sesamoid        Social History:      Social History   Tobacco Use  . Smoking status: Former Smoker    Years: 1.00    Types: Pipe    Quit date: 07/08/1977    Years since quitting: 43.6  . Smokeless tobacco: Never Used  Substance Use Topics  . Alcohol use: No    Alcohol/week: 0.0 standard drinks       Family History :     Family History  Problem Relation Age of Onset  . Heart disease Mother   . Hypertension Mother   . Heart attack Mother   . Hypertension  Father   . Diabetes Father   . Diabetes Son   . Heart disease Son   . Hypertension Son   . Colon cancer Neg Hx       Home Medications:   Prior to Admission medications   Medication Sig Start Date End Date Taking? Authorizing Provider  aspirin EC 81 MG EC tablet Take 1 tablet (81 mg total) by mouth daily. Patient taking differently: Take 81 mg by mouth at bedtime. 05/21/16   Barrett, Evelene Croon, PA-C  b complex vitamins tablet Take 1 tablet by mouth daily.    [provider]  carvedilol (COREG) 3.125 MG tablet Take 1 tablet (3.125 mg total) by mouth 2 (two) times daily with a meal. 12/07/19   [name], MD  Cholecalciferol (VITAMIN D3) 5000 units TABS Take 5,000 Units by mouth 2 (two) times daily.     [provider]  clopidogrel (PLAVIX) 75 MG tablet TAKE ONE (1) TABLET BY MOUTH EVERY DAY 12/03/20   Fayrene Helper, MD  Continuous Blood Gluc Receiver (FREESTYLE LIBRE 14 DAY READER) DEVI  12/07/19   [provider]  Continuous Blood Gluc Sensor (FREESTYLE LIBRE 14 DAY SENSOR) MISC Inject 1 each into the skin every 14 (fourteen) days. 01/22/21   Noreene Larsson, NP  Cyanocobalamin 1500 MCG TBDP Take 1,500 mcg by mouth daily.     [provider]  DULoxetine (CYMBALTA) 60 MG capsule Take 1 capsule (60 mg total) by mouth daily. 09/09/20   Fayrene Helper, MD  empagliflozin (JARDIANCE) 25 MG TABS tablet Take 1 tablet (25 mg total) by mouth daily before breakfast. 09/09/20   Fayrene Helper, MD  furosemide (LASIX) 40 MG tablet TAKE ONE TABLET (40MG  TOTAL) BY MOUTH TWO TIMES DAILY. 07/22/20   [name], MD  glimepiride (AMARYL) 4 MG tablet Take 1 tablet (4 mg total) by mouth daily with breakfast. 09/09/20   Fayrene Helper, MD  levothyroxine (SYNTHROID) 88 MCG tablet Take 1 tablet (88 mcg total) by mouth daily. 01/27/21   Noreene Larsson, NP  lidocaine (LIDODERM) 5 % Place 1 patch onto the skin daily. Remove & Discard patch within 12 hours or  as directed by MD 12/12/20   Volanda Napoleon, PA-C  linaclotide (LINZESS) 290 MCG CAPS capsule Take 290 mcg by mouth daily before breakfast.    [provider]  Multiple Vitamin (MULTIVITAMIN WITH MINERALS) TABS tablet Take 1 tablet by mouth daily.    [provider]  nitroGLYCERIN (NITROSTAT) 0.4 MG SL tablet Place 1 tablet (0.4 mg total) under the tongue every 5 (five) minutes as needed for chest pain. 05/22/16   Barrett, Evelene Croon, PA-C  pantoprazole (PROTONIX) 40 MG tablet TAKE ONE (1) TABLET BY MOUTH EVERY DAY 04/10/20   Erenest Rasher, PA-C  potassium chloride (KLOR-CON) 10 MEQ tablet Take 1 tablet (10 mEq total) by mouth daily. Please schedule appointment with Dr. Gwenlyn Found 09/23/20 12/22/20  [name], MD  pramipexole (MIRAPEX) 0.25 MG tablet Take 2 tablets (0.5 mg total) by mouth 2 (two) times daily. Take 0.5mg  by mouth at 7 PM and 0.5mg  at 9 PM daily. 02/25/20   Ward Givens, NP  rosuvastatin (CRESTOR) 20 MG tablet Take 1 tablet (20 mg total) by mouth daily. 09/09/20   Fayrene Helper, MD  UNABLE TO [place] health supplies for CPAP 09/30/20   Perlie Mayo, NP  UNABLE TO FIND [place] sock aid to help with support hose. Size XL.  Dx:I73.9, G25.81, R60.9 01/14/21   Noreene Larsson, NP     Allergies:     Allergies  Allergen Reactions  . Codeine Nausea And Vomiting  . Tramadol Nausea Only     Physical Exam:   Vitals  Blood pressure 134/81, pulse 75, temperature 97.9 F (36.6 C), temperature source Oral, resp. rate 20, height 5\' 10"  (1.778 m), weight 92 kg, SpO2 95 %.  1.  General: Patient lying supine in bed in no acute distress  2. Psychiatric: Mood and behavior normal for situation, alert and oriented x3, pleasant and cooperative with exam  3. Neurologic: Speech and language are normal, face is symmetric, moves all 4 extremities voluntarily, equal strength in the upper and lower extremities bilaterally 5 out of 5, normal sensation, intact  finger-to-nose  4. HEENMT:  Head with new bruise on left temple/parietal, healing bruise on top of the head, normocephalic, pupils reactive to light, neck is supple, trachea is midline, mucous membranes are moist  5. Respiratory : Lungs with diffuse wheezing, no rales or rhonchi, no clubbing, no cyanosis  6. Cardiovascular : Heart rate is normal, rhythm is regular, no murmurs rubs or gallops,  no peripheral edema  7. Gastrointestinal:  Abdomen is non distended, nontender to palpation, no palpable masses, bowel sounds active  8. Skin:  Skin is warm dry and intact without acute lesion on limited exam, bruising as above, bruise also on left chest  9.Musculoskeletal:  No peripheral edema, no acute deformities, no calf tenderness, peripheral pulses palpated    Data Review:    CBC Recent Labs  Lab 02/15/21 2347  WBC 8.7  HGB 10.6*  HCT 32.1*  PLT 225  MCV 100.0  MCH 33.0  MCHC 33.0  RDW 14.6  LYMPHSABS 1.2  MONOABS 0.5  EOSABS 0.2  BASOSABS 0.0   ------------------------------------------------------------------------------------------------------------------  Results for orders placed or performed during the hospital encounter of 02/15/21 (from the past 48 hour(s))  CBC with Differential/Platelet     Status: Abnormal   Collection Time: 02/15/21 11:47 PM  Result Value Ref Range   WBC 8.7 4.0 - 10.5 K/uL   RBC 3.21 (L) 4.22 - 5.81 MIL/uL   Hemoglobin 10.6 (L) 13.0 - 17.0 g/dL   HCT 32.1 (L) 39.0 - 52.0 %   MCV 100.0 80.0 - 100.0 fL   MCH 33.0 26.0 - 34.0 pg   MCHC 33.0 30.0 - 36.0 g/dL   RDW 14.6 11.5 - 15.5 %   Platelets 225 150 - 400 K/uL   nRBC 0.0 0.0 - 0.2 %   Neutrophils Relative % 78 %   Neutro Abs 6.8 1.7 - 7.7 K/uL   Lymphocytes Relative 13 %   Lymphs Abs 1.2 0.7 - 4.0 K/uL   Monocytes Relative 6 %   Monocytes Absolute 0.5 0.1 - 1.0 K/uL   Eosinophils Relative 2 %   Eosinophils Absolute 0.2 0.0 - 0.5 K/uL   Basophils Relative 1 %   Basophils Absolute  0.0 0.0 - 0.1 K/uL   Immature Granulocytes 0 %   Abs Immature Granulocytes 0.02 0.00 - 0.07 K/uL    Comment: Performed at [facility], [address] [place], [place] [address]  Comprehensive metabolic panel     Status: Abnormal   Collection Time: 02/15/21 11:47 PM  Result Value Ref Range   Sodium 136 135 - 145 mmol/L   Potassium 3.2 (L) 3.5 - 5.1 mmol/L   Chloride 98 98 - 111 mmol/L   CO2 29 22 - 32 mmol/L   Glucose, Bld 94 70 - 99 mg/dL    Comment: Glucose reference range applies only to samples taken after fasting for at least 8 hours.   BUN 17 8 - 23 mg/dL   Creatinine, Ser 0.87 0.61 - 1.24 mg/dL   Calcium 8.8 (L) 8.9 - 10.3 mg/dL   Total Protein 7.3 6.5 - 8.1 g/dL   Albumin 4.0 3.5 - 5.0 g/dL   AST 18 15 - 41 U/L   ALT 16 0 - 44 U/L   Alkaline Phosphatase 67 38 - 126 U/L   Total Bilirubin 0.9 0.3 - 1.2 mg/dL   GFR, Estimated >60 >60 mL/min    Comment: (NOTE) Calculated using the CKD-EPI Creatinine Equation (2021)    Anion gap 9 5 - 15    Comment: Performed at [facility], [address] [place], [place] [address]  Urinalysis, Routine w reflex microscopic Urine, Clean Catch     Status: Abnormal   Collection Time: 02/16/21  1:01 AM  Result Value Ref Range   Color, Urine YELLOW YELLOW   APPearance HAZY (A) CLEAR   Specific Gravity, Urine 1.011 1.005 - 1.030   pH 6.0 5.0 - 8.0   Glucose, UA NEGATIVE NEGATIVE mg/dL   Hgb urine dipstick NEGATIVE NEGATIVE   Bilirubin Urine NEGATIVE NEGATIVE   Ketones, ur NEGATIVE NEGATIVE mg/dL   Protein, ur NEGATIVE NEGATIVE mg/dL   Nitrite NEGATIVE NEGATIVE   Leukocytes,Ua NEGATIVE NEGATIVE    Comment: Performed at [facility], [address], [place], [place] [address]  CBG monitoring, ED     Status: None   Collection Time: 02/16/21  1:27 AM  Result Value Ref Range   Glucose-Capillary 98 70 - 99 mg/dL    Comment: Glucose reference range applies only to samples taken after fasting for  at least 8 hours.    Chemistries  Recent Labs   Lab 02/15/21 2347  NA 136  K 3.2*  CL 98  CO2 29  GLUCOSE 94  BUN 17  CREATININE 0.87  CALCIUM 8.8*  AST 18  ALT 16  ALKPHOS 67  BILITOT 0.9   ------------------------------------------------------------------------------------------------------------------  ------------------------------------------------------------------------------------------------------------------ GFR: Estimated Creatinine Clearance: 81.1 mL/min (by C-G formula based on SCr of 0.87 mg/dL). Liver Function Tests: Recent Labs  Lab 02/15/21 2347  AST 18  ALT 16  ALKPHOS 67  BILITOT 0.9  PROT 7.3  ALBUMIN 4.0   No results for input(s): LIPASE, AMYLASE in the last 168 hours. No results for input(s): AMMONIA in the last 168 hours. Coagulation Profile: No results for input(s): INR, PROTIME in the last 168 hours. Cardiac Enzymes: No results for input(s): CKTOTAL, CKMB, CKMBINDEX, TROPONINI in the last 168 hours. BNP (last 3 results) No results for input(s): PROBNP in the last 8760 hours. HbA1C: No results for input(s): HGBA1C in the last 72 hours. CBG: Recent Labs  Lab 02/16/21 0127  GLUCAP 98   Lipid Profile: No results for input(s): CHOL, HDL, LDLCALC, TRIG, CHOLHDL, LDLDIRECT in the last 72 hours. Thyroid Function Tests: No results for input(s): TSH, T4TOTAL, FREET4, T3FREE, THYROIDAB in the last 72 hours. Anemia Panel: No results for input(s): VITAMINB12, FOLATE, FERRITIN, TIBC, IRON, RETICCTPCT in the last 72 hours.  --------------------------------------------------------------------------------------------------------------- Urine analysis:    Component Value Date/Time   COLORURINE YELLOW 02/16/2021 0101   APPEARANCEUR HAZY (A) 02/16/2021 0101   LABSPEC 1.011 02/16/2021 0101   PHURINE 6.0 02/16/2021 0101   GLUCOSEU NEGATIVE 02/16/2021 0101   HGBUR NEGATIVE 02/16/2021 0101   BILIRUBINUR NEGATIVE 02/16/2021 0101   KETONESUR NEGATIVE 02/16/2021 0101   PROTEINUR NEGATIVE 02/16/2021  0101   UROBILINOGEN 0.2 11/23/2013 1530   NITRITE NEGATIVE 02/16/2021 0101   LEUKOCYTESUR NEGATIVE 02/16/2021 0101      Imaging Results:    DG Chest 1 View  Result Date: 02/16/2021 CLINICAL DATA:  [place] 2 hours ago, weakness EXAM: CHEST  1 VIEW COMPARISON:  12/12/2020 FINDINGS: Single frontal view of the chest demonstrates a stable cardiac silhouette. There is chronic elevation of the left hemidiaphragm. Stable scarring throughout the lungs without airspace disease, effusion, or pneumothorax. No acute bony abnormalities. IMPRESSION: 1. Stable chest, no acute process. Electronically Signed   By: Randa Ngo M.D.   On: 02/16/2021 00:19   DG Pelvis 1-2 Views  Result Date: 02/16/2021 CLINICAL DATA:  [place], weakness EXAM: PELVIS - 1-2 VIEW COMPARISON:  None. FINDINGS: Supine frontal view of the pelvis demonstrates no acute displaced fractures. Mild symmetrical bilateral hip osteoarthritis. No subluxation or dislocation. Sacroiliac joints are normal. L4-5 discectomy and posterior fusion. IMPRESSION: 1. Symmetrical bilateral hip osteoarthritis.  No acute fracture. Electronically Signed   By: Randa Ngo M.D.   On: 02/16/2021 00:19   CT Head Wo Contrast  Result Date: 02/16/2021 CLINICAL DATA:  [place] 2 hours ago, left supraorbital swelling, weakness EXAM: CT HEAD WITHOUT CONTRAST TECHNIQUE: Contiguous axial images were obtained from the base of the skull through the vertex without intravenous contrast. COMPARISON:  12/12/2020 FINDINGS: Brain: Stable chronic ischemic changes are seen within the right basal ganglia and right frontal periventricular white matter. No acute infarct or hemorrhage. Lateral ventricles and remaining midline structures are unremarkable. No acute extra-axial fluid collections. No mass effect. Vascular: No hyperdense vessel or unexpected calcification. Skull: Small left frontal scalp hematoma. No underlying fracture. The remainder of the calvarium is unremarkable. Sinuses/Orbits:  No acute finding. Other: None. IMPRESSION: 1. No acute intracranial process. Stable chronic small vessel ischemic changes. Electronically Signed   By: Randa Ngo M.D.   On: 02/16/2021 00:15   CT Cervical Spine Wo Contrast  Result Date: 02/16/2021 CLINICAL DATA:  [place] 2 hours ago, worsening weakness EXAM: CT CERVICAL SPINE WITHOUT CONTRAST TECHNIQUE: Multidetector CT imaging of the cervical spine was performed without intravenous contrast. Multiplanar CT image reconstructions were also generated. COMPARISON:  12/12/2020 FINDINGS: Alignment: Alignment is grossly anatomic. Skull base and vertebrae: No acute fracture. No primary bone lesion or focal pathologic process. Soft tissues and spinal canal: No prevertebral fluid or swelling. No visible canal hematoma. Disc levels: Stable ACDF spanning C3-4. Stable multilevel spondylosis most pronounced at C5-6 and C6-7. Ossification of the posterior longitudinal ligament at C4-5. Stable diffuse facet hypertrophy greatest from C2 through C4. Upper chest: Airway is patent.  Lung apices are clear. Other: Reconstructed images demonstrate no additional findings. IMPRESSION: 1. No acute cervical spine fracture. 2. Stable C3-4 ACDF. 3. Stable extensive multilevel cervical spondylosis and facet hypertrophy. Electronically Signed   By: Randa Ngo M.D.   On: 02/16/2021 00:18       Assessment & Plan:    Principal Problem:   Syncope   1. Syncope 1. Most likely secondary to orthostatic hypotension 2. Monitor orthostatic vitals every shift 3. Monitor on telemetry 4. Echo in the a.m. 5. Continue hydration 6. Continue to monitor 2. Frequent falls 1. No fractures on imaging 2. PT eval and treat 3. Hypokalemia 1. Replace and recheck 4. Hypothyroidism 1. Continue Synthroid 5. Diabetes mellitus type 2 1. Last hemoglobin A1c was 9.6 2. Hold oral hypoglycemics 3. Sliding scale coverage 6. CHF 1. Continue aspirin, Coreg, Crestor, Plavix 2. Holding  Lasix 7. GERD 1. Continue Protonix   DVT Prophylaxis-Heparin- SCDs   AM Labs Ordered, also please review Full Orders  Family Communication: No family at bedside Code Status: Full  Admission status: Observation Time spent in minutes : [place] DO

## 2021-02-16 NOTE — Plan of Care (Signed)
  Problem: Acute Rehab PT Goals(only PT should resolve) Goal: Pt Will Go Supine/Side To Sit Outcome: Progressing Flowsheets (Taken 02/16/2021 1158) Pt will go Supine/Side to Sit: with minimal assist Goal: Patient Will Transfer Sit To/From Stand Outcome: Progressing Flowsheets (Taken 02/16/2021 1158) Patient will transfer sit to/from stand: with minimal assist Goal: Pt Will Transfer Bed To Chair/Chair To Bed Outcome: Progressing Flowsheets (Taken 02/16/2021 1158) Pt will Transfer Bed to Chair/Chair to Bed: with min assist Goal: Pt Will Ambulate Outcome: Progressing Flowsheets (Taken 02/16/2021 1158) Pt will Ambulate:  25 feet  with minimal assist  with rolling walker Goal: Pt/caregiver will Perform Home Exercise Program Outcome: Progressing Flowsheets (Taken 02/16/2021 1158) Pt/caregiver will Perform Home Exercise Program:  For increased strengthening  For improved balance  With Supervision, verbal cues required/provided  11:59 AM, 02/16/21 Mearl Latin PT, DPT Physical Therapist at [facility]

## 2021-02-16 NOTE — Progress Notes (Signed)
ASSUMPTION OF CARE NOTE   02/16/2021 3:14 PM  [name] was seen and examined.  The H&P by the admitting provider, orders, imaging was reviewed.  Please see new orders.  Will continue to follow.  PT recommending SNF.  TOC working on placement.   Vitals:   02/16/21 0820 02/16/21 1200  BP: (!) 153/81 123/64  Pulse: 75 72  Resp: 16 18  Temp: 97.9 F (36.6 C) 98.7 F (37.1 C)  SpO2: 98% 100%    Results for orders placed or performed during the hospital encounter of 02/15/21  Resp Panel by RT-PCR (Flu A&B, Covid) Nasopharyngeal Swab   Specimen: Nasopharyngeal Swab; Nasopharyngeal(NP) swabs in vial transport medium  Result Value Ref Range   SARS Coronavirus 2 by RT PCR NEGATIVE NEGATIVE   Influenza A by PCR NEGATIVE NEGATIVE   Influenza B by PCR NEGATIVE NEGATIVE  CBC with Differential/Platelet  Result Value Ref Range   WBC 8.7 4.0 - 10.5 K/uL   RBC 3.21 (L) 4.22 - 5.81 MIL/uL   Hemoglobin 10.6 (L) 13.0 - 17.0 g/dL   HCT 32.1 (L) 39.0 - 52.0 %   MCV 100.0 80.0 - 100.0 fL   MCH 33.0 26.0 - 34.0 pg   MCHC 33.0 30.0 - 36.0 g/dL   RDW 14.6 11.5 - 15.5 %   Platelets 225 150 - 400 K/uL   nRBC 0.0 0.0 - 0.2 %   Neutrophils Relative % 78 %   Neutro Abs 6.8 1.7 - 7.7 K/uL   Lymphocytes Relative 13 %   Lymphs Abs 1.2 0.7 - 4.0 K/uL   Monocytes Relative 6 %   Monocytes Absolute 0.5 0.1 - 1.0 K/uL   Eosinophils Relative 2 %   Eosinophils Absolute 0.2 0.0 - 0.5 K/uL   Basophils Relative 1 %   Basophils Absolute 0.0 0.0 - 0.1 K/uL   Immature Granulocytes 0 %   Abs Immature Granulocytes 0.02 0.00 - 0.07 K/uL  Comprehensive metabolic panel  Result Value Ref Range   Sodium 136 135 - 145 mmol/L   Potassium 3.2 (L) 3.5 - 5.1 mmol/L   Chloride 98 98 - 111 mmol/L   CO2 29 22 - 32 mmol/L   Glucose, Bld 94 70 - 99 mg/dL   BUN 17 8 - 23 mg/dL   Creatinine, Ser 0.87 0.61 - 1.24 mg/dL   Calcium 8.8 (L) 8.9 - 10.3 mg/dL   Total Protein 7.3 6.5 - 8.1 g/dL   Albumin 4.0 3.5 - 5.0 g/dL    AST 18 15 - 41 U/L   ALT 16 0 - 44 U/L   Alkaline Phosphatase 67 38 - 126 U/L   Total Bilirubin 0.9 0.3 - 1.2 mg/dL   GFR, Estimated >60 >60 mL/min   Anion gap 9 5 - 15  Urinalysis, Routine w reflex microscopic Urine, Clean Catch  Result Value Ref Range   Color, Urine YELLOW YELLOW   APPearance HAZY (A) CLEAR   Specific Gravity, Urine 1.011 1.005 - 1.030   pH 6.0 5.0 - 8.0   Glucose, UA NEGATIVE NEGATIVE mg/dL   Hgb urine dipstick NEGATIVE NEGATIVE   Bilirubin Urine NEGATIVE NEGATIVE   Ketones, ur NEGATIVE NEGATIVE mg/dL   Protein, ur NEGATIVE NEGATIVE mg/dL   Nitrite NEGATIVE NEGATIVE   Leukocytes,Ua NEGATIVE NEGATIVE  Glucose, capillary  Result Value Ref Range   Glucose-Capillary 157 (H) 70 - 99 mg/dL  Glucose, capillary  Result Value Ref Range   Glucose-Capillary 138 (H) 70 - 99 mg/dL  CBG  monitoring, ED  Result Value Ref Range   Glucose-Capillary 98 70 - 99 mg/dL  ECHOCARDIOGRAM COMPLETE  Result Value Ref Range   Weight 3,185.21 oz   Height 70 in   BP 153/81 mmHg   Area-P 1/2 4.65 cm2   S' Lateral 3.90 cm   P 1/2 time 337 msec     Murvin Natal, MD [facility]   02/15/2021 10:43 PM How to contact the [facility] Attending or Consulting provider [place] or covering provider during after hours [place], for this patient?  1. Check the care team in [facility] and look for a) attending/consulting TR[facility] provider listed and b) the [facility] team listed 2. Log into [email or web address] and use [place]'s universal password to access. If you do not have the password, please contact the hospital operator. 3. Locate the [facility] provider you are looking for under [facility] and page to a number that you can be directly reached. 4. If you still have difficulty reaching the provider, please page the [facility] (Director on Call) for the Hospitalists listed on amion for assistance.

## 2021-02-16 NOTE — TOC Initial Note (Signed)
Transition of Care [facility]) - Initial/Assessment Note    Patient Details  Name: [name] MRN: [id number] Date of Birth: [birth year]  Transition of Care [facility]) CM/SW Contact:    Ihor Gully, LCSW Phone Number: 02/16/2021, 2:15 PM  Clinical Narrative:                 From home with spouse who recently had heart surgery. Admitted for sycope. At baseline, has cane and walker. Drives. Recommended for SNF. Agreeable to SNF. Wants SNF in area. Referred to area SNFs.  Expected Discharge Plan: Skilled Nursing Facility Barriers to Discharge: Continued Medical Work up   Patient Goals and CMS Choice Patient states their goals for this hospitalization and ongoing recovery are:: rehab then home      Expected Discharge Plan and Services Expected Discharge Plan: [place] Choice: [place] arrangements for the past 2 months: Single Family Home                                      Prior Living Arrangements/Services Living arrangements for the past 2 months: Single Family Home Lives with:: Spouse Patient language and need for interpreter reviewed:: Yes Do you feel safe going back to the place where you live?: Yes      Need for Family Participation in Patient Care: Yes (Comment) Care giver support system in place?: Yes (comment) Current home services: DME Criminal Activity/Legal Involvement Pertinent to Current Situation/Hospitalization: No - Comment as needed  Activities of Daily Living      Permission Sought/Granted                  Emotional Assessment     Affect (typically observed): Appropriate Orientation: : Oriented to Self,Oriented to Place,Oriented to  Time,Oriented to Situation Alcohol / Substance Use: Not Applicable    Admission diagnosis:  Seizure ([place]) [R56.9] Syncope [R55] Orthostasis [I95.1] Fall [W19.XXXA] Minor head injury, initial encounter [S09.90XA] Syncope, unspecified syncope  type [R55] Patient Active Problem List   Diagnosis Date Noted  . Syncope 02/16/2021  . Right hip pain 02/11/2021  . Erectile dysfunction 01/22/2021  . Gait abnormality 09/30/2020  . Mild non proliferative diabetic retinopathy ([place]) 07/16/2020  . Need for immunization against influenza 06/03/2020  . S/P left knee arthroscopy 05/06/20 05/13/2020  . Abnormal MRI 03/26/2020  . Joint disorder of knee 03/26/2020  . Fall at home, initial encounter 03/13/2020  . Anemia 09/03/2019  . Reflux esophagitis 09/03/2019  . Esophageal dysphagia 05/30/2019  . Constipation 05/30/2019  . Positive colorectal cancer screening using Cologuard test 05/30/2019  . Diabetic foot ulcer ([place]) 09/06/2018  . Congestive heart failure ([place]) 05/17/2016  . Bilateral carotid artery disease ([place]) 07/08/2014  . Obstructive sleep apnea 02/12/2014  . Restless leg syndrome 10/23/2013  . Overweight with body mass index (BMI) of 29 to 29.9 in adult 10/23/2013  . Hyperlipidemia 07/16/2013  . Peripheral arterial disease ([place]) 07/16/2013  . Hypertension 03/14/2012  . Type 2 diabetes mellitus with circulatory disorder ([place]) 03/14/2012  . Peripheral neuropathy 03/14/2012  . Cardiomyopathy- EF NL 01/2013, now 30-35% echo 03/14/2012   PCP:  Noreene Larsson, NP Pharmacy:   [place], [place] [address] Phone: [phone] Fax: [phone]  [place], [place] - [address]  [place] [address] Phone: [phone] Fax: [place], [place] [address] Phone: [phone] Fax: [phone]     Social Determinants of Health (SDOH) Interventions    Readmission Risk Interventions No flowsheet data found.

## 2021-02-17 DIAGNOSIS — Z7401 Bed confinement status: Secondary | ICD-10-CM | POA: Diagnosis not present

## 2021-02-17 DIAGNOSIS — R55 Syncope and collapse: Secondary | ICD-10-CM | POA: Diagnosis not present

## 2021-02-17 DIAGNOSIS — R5381 Other malaise: Secondary | ICD-10-CM | POA: Diagnosis not present

## 2021-02-17 DIAGNOSIS — R279 Unspecified lack of coordination: Secondary | ICD-10-CM | POA: Diagnosis not present

## 2021-02-17 DIAGNOSIS — I951 Orthostatic hypotension: Secondary | ICD-10-CM | POA: Diagnosis not present

## 2021-02-17 LAB — COMPREHENSIVE METABOLIC PANEL
ALT: 14 U/L (ref 0–44)
AST: 17 U/L (ref 15–41)
Albumin: 3.4 g/dL — ABNORMAL LOW (ref 3.5–5.0)
Alkaline Phosphatase: 57 U/L (ref 38–126)
Anion gap: 6 (ref 5–15)
BUN: 9 mg/dL (ref 8–23)
CO2: 25 mmol/L (ref 22–32)
Calcium: 8.4 mg/dL — ABNORMAL LOW (ref 8.9–10.3)
Chloride: 105 mmol/L (ref 98–111)
Creatinine, Ser: 0.53 mg/dL — ABNORMAL LOW (ref 0.61–1.24)
GFR, Estimated: >60 mL/min (ref >60)
Glucose, Bld: 90 mg/dL (ref 70–99)
Potassium: 3.4 mmol/L — ABNORMAL LOW (ref 3.5–5.1)
Sodium: 136 mmol/L (ref 135–145)
Total Bilirubin: 0.9 mg/dL (ref 0.3–1.2)
Total Protein: 6.4 g/dL — ABNORMAL LOW (ref 6.5–8.1)

## 2021-02-17 LAB — CBC
HCT: 30 % — ABNORMAL LOW (ref 39.0–52.0)
Hemoglobin: 10.1 g/dL — ABNORMAL LOW (ref 13.0–17.0)
MCH: 33.9 pg (ref 26.0–34.0)
MCHC: 33.7 g/dL (ref 30.0–36.0)
MCV: 100.7 fL — ABNORMAL HIGH (ref 80.0–100.0)
Platelets: 210 10*3/uL (ref 150–400)
RBC: 2.98 MIL/uL — ABNORMAL LOW (ref 4.22–5.81)
RDW: 14.7 % (ref 11.5–15.5)
WBC: 8.4 10*3/uL (ref 4.0–10.5)
nRBC: 0 % (ref 0.0–0.2)

## 2021-02-17 LAB — TSH: TSH: 2.445 u[IU]/mL (ref 0.350–4.500)

## 2021-02-17 LAB — GLUCOSE, CAPILLARY
Glucose-Capillary: 81 mg/dL (ref 70–99)
Glucose-Capillary: 129 mg/dL — ABNORMAL HIGH (ref 70–99)

## 2021-02-17 LAB — MAGNESIUM: Magnesium: 1.8 mg/dL (ref 1.7–2.4)

## 2021-02-17 MED ORDER — POTASSIUM CHLORIDE ER 10 MEQ PO TBCR: 10.0000 meq | EXTENDED_RELEASE_TABLET | Freq: Every day | ORAL | 0 refills | Status: DC

## 2021-02-17 MED ORDER — FUROSEMIDE 40 MG PO TABS: 40.0000 mg | ORAL_TABLET | Freq: Every day | ORAL | Status: DC

## 2021-02-17 MED ORDER — POTASSIUM CHLORIDE CRYS ER 20 MEQ PO TBCR
40.0000 meq | EXTENDED_RELEASE_TABLET | Freq: Once | ORAL | Status: AC
  Administered 2021-02-17: 40 meq via ORAL
  Filled 2021-02-17: qty 2

## 2021-02-17 MED ORDER — LINAGLIPTIN 5 MG PO TABS: 5.0000 mg | ORAL_TABLET | Freq: Every day | ORAL | Status: DC

## 2021-02-17 NOTE — Discharge Summary (Signed)
Physician Discharge Summary  [name] WJX:[id number] DOB: Oct 17, 1945 DOA: 02/15/2021  PCP: Noreene Larsson, NP Cardiologist; Dr. Gwenlyn Found   Admit date: 02/15/2021 Discharge date: 02/17/2021  Admitted From:  Home  Disposition: SNF   Recommendations for Outpatient Follow-up:  1. Follow up with PCP in 2 weeks 2. Follow up with cardiology in 1 month 3. Please obtain BMP/CBC in 2 weeks to follow electrolytes  Discharge Condition: STABLE   CODE STATUS: FULL DIET:  Heart healthy carb modified   Brief Hospitalization Summary: Please see all hospital notes, images, labs for full details of the hospitalization. ADMISSION HPI:   [name]  is a 75 y.o. male, with history of stroke, restless leg, orthostatic hypotension, obstructive sleep apnea, hypothyroidism, hypertension, GERD, diabetes mellitus, CHF, and more presents ED with a chief complaint of syncope.  Patient reports that he had just finished his meal, was sitting in a chair at the table, when he woke up on the floor.  He thinks he fell asleep at the table and then fell onto the floor.  He does report loss of consciousness and that he hit his head.  He could not get up on his own due to generalized weakness so he called out to his wife.  Wife was able to roll him over onto his belly, and then patient had to crawl across the room to a railing to get up.  He reports that he felt extremely weak, like he can even push off with his arms.  He would did not have asymmetrical weakness.  Patient did not have chest pain when this happened.  He does report that for 2 hours he remained kind of dizzy and confused.  He was aware of what was going on but felt like it was surreal.  Patient does report recently he has been having frequent falls because of arthritis in his hips.  He thinks he has been getting weaker and weaker over 2 years.  He has had multiple falls in the past couple of weeks.  Patient does not have a history of hypoglycemic episodes, and  reports that this did not feel like a hypoglycemic episode.  He does not admit to chest pain, palpitations, diaphoresis, nausea or vomiting.  He has had an intermittent cough productive of clear sputum that is chronic.  He denies any fevers.  Patient does not smoke, does not drink alcohol, does not use illicit drugs.  Patient is vaccinated for COVID.  Patient is full code.  In the ED Temp 97.9, heart rate 69-75, blood pressure 134/81, dropping down into the 70s upon standing, satting at 95% UA is negative for UTI CT cervical spine and head showed no acute cervical spine fracture, no acute intracranial process. Chest x-ray shows stable chest no acute process X-ray pelvis shows symmetrical bilateral hip arthritis with no acute fracture EKG shows a heart rate of 73, QTc 480, sinus rhythm No leukocytosis with a white blood cell count of 8.7, hemoglobin 10.6 Mild hypokalemia at 3.2 Admission requested for further work-up syncope   Hospital Course:    Patient was admitted for orthostatic syncope.  He was having recurrent falls at home.  In addition he was dehydrated.  He has been treated with IV fluid hydration with good results.  His electrolytes have been corrected.  He has been ambulating with physical therapy and they have recommended SNF rehabilitation which patient is agreeable to.  Arrangements are being made for SNF placement.  Patient is stable to discharge to  SNF for further rehabilitation.   Discharge Diagnoses:  Principal Problem:   Syncope Active Problems:   Orthostasis   Minor head injury   Discharge Instructions:  Allergies as of 02/17/2021      Reactions   Codeine Nausea And Vomiting   Tramadol Nausea Only      Medication List    STOP taking these medications   glimepiride 4 MG tablet Commonly known as: AMARYL   lidocaine 5 % Commonly known as: Lidoderm     TAKE these medications   aspirin 81 MG EC tablet Take 1 tablet (81 mg total) by mouth daily. What  changed: when to take this   b complex vitamins tablet Take 1 tablet by mouth daily.   carvedilol 3.125 MG tablet Commonly known as: COREG Take 1 tablet (3.125 mg total) by mouth 2 (two) times daily with a meal.   clopidogrel 75 MG tablet Commonly known as: PLAVIX TAKE ONE (1) TABLET BY MOUTH EVERY DAY What changed: how much to take   Cyanocobalamin 1500 MCG Tbdp Take 1,500 mcg by mouth daily.   DULoxetine 60 MG capsule Commonly known as: CYMBALTA Take 1 capsule (60 mg total) by mouth daily.   empagliflozin 25 MG Tabs tablet Commonly known as: Jardiance Take 1 tablet (25 mg total) by mouth daily before breakfast.   FreeStyle Libre 14 Day Reader [facility] 14 Day Sensor Misc Inject 1 each into the skin every 14 (fourteen) days.   furosemide 40 MG tablet Commonly known as: LASIX Take 1 tablet (40 mg total) by mouth daily. Start taking on: February 19, 2021 What changed:   See the new instructions.  These instructions start on February 19, 2021. If you are unsure what to do until then, ask your doctor or other care provider.   levothyroxine 88 MCG tablet Commonly known as: SYNTHROID Take 1 tablet (88 mcg total) by mouth daily.   linagliptin 5 MG Tabs tablet Commonly known as: TRADJENTA Take 1 tablet (5 mg total) by mouth daily.   multivitamin with minerals Tabs tablet Take 1 tablet by mouth daily.   nitroGLYCERIN 0.4 MG SL tablet Commonly known as: Nitrostat Place 1 tablet (0.4 mg total) under the tongue every 5 (five) minutes as needed for chest pain.   pantoprazole 40 MG tablet Commonly known as: PROTONIX TAKE ONE (1) TABLET BY MOUTH EVERY DAY What changed: how much to take   potassium chloride 10 MEQ tablet Commonly known as: KLOR-CON Take 1 tablet (10 mEq total) by mouth daily. Please schedule appointment with Dr. Gwenlyn Found Start taking on: February 19, 2021 What changed: These instructions start on February 19, 2021. If you are unsure what to do until then, ask  your doctor or other care provider.   pramipexole 0.25 MG tablet Commonly known as: MIRAPEX Take 2 tablets (0.5 mg total) by mouth 2 (two) times daily. Take 0.5mg  by mouth at 7 PM and 0.5mg  at 9 PM daily.   rosuvastatin 20 MG tablet Commonly known as: CRESTOR Take 1 tablet (20 mg total) by mouth daily.   UNABLE TO FIND Home health supplies for CPAP   UNABLE TO FIND Butler sock aid to help with support hose. Size XL.  Dx:I73.9, G25.81, R60.9   Vitamin D3 125 MCG (5000 UT) Tabs Take 5,000 Units by mouth 2 (two) times daily.       Contact information for follow-up providers    Noreene Larsson, NP. Schedule an appointment as soon as possible for  a visit in 2 week(s).   Specialty: Nurse Practitioner Contact information: [address]  [address] [place] [address] [phone]        [name], MD. Schedule an appointment as soon as possible for a visit in 1 month(s).   Specialties: Cardiology, Radiology Contact information: [address] [place] [address] [phone]            Contact information for after-discharge care    [facility] .   Service: Skilled Nursing Contact information: [place] [phone]                 Allergies  Allergen Reactions  . Codeine Nausea And Vomiting  . Tramadol Nausea Only   Allergies as of 02/17/2021      Reactions   Codeine Nausea And Vomiting   Tramadol Nausea Only      Medication List    STOP taking these medications   glimepiride 4 MG tablet Commonly known as: AMARYL   lidocaine 5 % Commonly known as: Lidoderm     TAKE these medications   aspirin 81 MG EC tablet Take 1 tablet (81 mg total) by mouth daily. What changed: when to take this   b complex vitamins tablet Take 1 tablet by mouth daily.   carvedilol 3.125 MG tablet Commonly known as: COREG Take 1 tablet (3.125 mg  total) by mouth 2 (two) times daily with a meal.   clopidogrel 75 MG tablet Commonly known as: PLAVIX TAKE ONE (1) TABLET BY MOUTH EVERY DAY What changed: how much to take   Cyanocobalamin 1500 MCG Tbdp Take 1,500 mcg by mouth daily.   DULoxetine 60 MG capsule Commonly known as: CYMBALTA Take 1 capsule (60 mg total) by mouth daily.   empagliflozin 25 MG Tabs tablet Commonly known as: Jardiance Take 1 tablet (25 mg total) by mouth daily before breakfast.   FreeStyle Libre 14 Day Reader [facility] 14 Day Sensor Misc Inject 1 each into the skin every 14 (fourteen) days.   furosemide 40 MG tablet Commonly known as: LASIX Take 1 tablet (40 mg total) by mouth daily. Start taking on: February 19, 2021 What changed:   See the new instructions.  These instructions start on February 19, 2021. If you are unsure what to do until then, ask your doctor or other care provider.   levothyroxine 88 MCG tablet Commonly known as: SYNTHROID Take 1 tablet (88 mcg total) by mouth daily.   linagliptin 5 MG Tabs tablet Commonly known as: TRADJENTA Take 1 tablet (5 mg total) by mouth daily.   multivitamin with minerals Tabs tablet Take 1 tablet by mouth daily.   nitroGLYCERIN 0.4 MG SL tablet Commonly known as: Nitrostat Place 1 tablet (0.4 mg total) under the tongue every 5 (five) minutes as needed for chest pain.   pantoprazole 40 MG tablet Commonly known as: PROTONIX TAKE ONE (1) TABLET BY MOUTH EVERY DAY What changed: how much to take   potassium chloride 10 MEQ tablet Commonly known as: KLOR-CON Take 1 tablet (10 mEq total) by mouth daily. Please schedule appointment with Dr. Gwenlyn Found Start taking on: February 19, 2021 What changed: These instructions start on February 19, 2021. If you are unsure what to do until then, ask your doctor or other care provider.   pramipexole 0.25 MG tablet Commonly known as: MIRAPEX Take 2 tablets (0.5 mg total) by  mouth 2 (two) times daily. Take 0.5mg  by  mouth at 7 PM and 0.5mg  at 9 PM daily.   rosuvastatin 20 MG tablet Commonly known as: CRESTOR Take 1 tablet (20 mg total) by mouth daily.   UNABLE TO FIND Home health supplies for CPAP   UNABLE TO FIND Butler sock aid to help with support hose. Size XL.  Dx:I73.9, G25.81, R60.9   Vitamin D3 125 MCG (5000 UT) Tabs Take 5,000 Units by mouth 2 (two) times daily.       Procedures/Studies: DG Chest 1 View  Result Date: 02/16/2021 CLINICAL DATA:  [place] 2 hours ago, weakness EXAM: CHEST  1 VIEW COMPARISON:  12/12/2020 FINDINGS: Single frontal view of the chest demonstrates a stable cardiac silhouette. There is chronic elevation of the left hemidiaphragm. Stable scarring throughout the lungs without airspace disease, effusion, or pneumothorax. No acute bony abnormalities. IMPRESSION: 1. Stable chest, no acute process. Electronically Signed   By: Randa Ngo M.D.   On: 02/16/2021 00:19   DG Pelvis 1-2 Views  Result Date: 02/16/2021 CLINICAL DATA:  [place], weakness EXAM: PELVIS - 1-2 VIEW COMPARISON:  None. FINDINGS: Supine frontal view of the pelvis demonstrates no acute displaced fractures. Mild symmetrical bilateral hip osteoarthritis. No subluxation or dislocation. Sacroiliac joints are normal. L4-5 discectomy and posterior fusion. IMPRESSION: 1. Symmetrical bilateral hip osteoarthritis.  No acute fracture. Electronically Signed   By: Randa Ngo M.D.   On: 02/16/2021 00:19   CT Head Wo Contrast  Result Date: 02/16/2021 CLINICAL DATA:  [place] 2 hours ago, left supraorbital swelling, weakness EXAM: CT HEAD WITHOUT CONTRAST TECHNIQUE: Contiguous axial images were obtained from the base of the skull through the vertex without intravenous contrast. COMPARISON:  12/12/2020 FINDINGS: Brain: Stable chronic ischemic changes are seen within the right basal ganglia and right frontal periventricular white matter. No acute infarct or hemorrhage. Lateral ventricles and remaining midline structures are  unremarkable. No acute extra-axial fluid collections. No mass effect. Vascular: No hyperdense vessel or unexpected calcification. Skull: Small left frontal scalp hematoma. No underlying fracture. The remainder of the calvarium is unremarkable. Sinuses/Orbits: No acute finding. Other: None. IMPRESSION: 1. No acute intracranial process. Stable chronic small vessel ischemic changes. Electronically Signed   By: Randa Ngo M.D.   On: 02/16/2021 00:15   CT Cervical Spine Wo Contrast  Result Date: 02/16/2021 CLINICAL DATA:  [place] 2 hours ago, worsening weakness EXAM: CT CERVICAL SPINE WITHOUT CONTRAST TECHNIQUE: Multidetector CT imaging of the cervical spine was performed without intravenous contrast. Multiplanar CT image reconstructions were also generated. COMPARISON:  12/12/2020 FINDINGS: Alignment: Alignment is grossly anatomic. Skull base and vertebrae: No acute fracture. No primary bone lesion or focal pathologic process. Soft tissues and spinal canal: No prevertebral fluid or swelling. No visible canal hematoma. Disc levels: Stable ACDF spanning C3-4. Stable multilevel spondylosis most pronounced at C5-6 and C6-7. Ossification of the posterior longitudinal ligament at C4-5. Stable diffuse facet hypertrophy greatest from C2 through C4. Upper chest: Airway is patent.  Lung apices are clear. Other: Reconstructed images demonstrate no additional findings. IMPRESSION: 1. No acute cervical spine fracture. 2. Stable C3-4 ACDF. 3. Stable extensive multilevel cervical spondylosis and facet hypertrophy. Electronically Signed   By: Randa Ngo M.D.   On: 02/16/2021 00:18   ECHOCARDIOGRAM COMPLETE  Result Date: 02/16/2021    ECHOCARDIOGRAM REPORT   Patient Name:   LEONARD FEIGEL Date of Exam: 02/16/2021 Medical Rec #:  [id number]        Height:  70.0 in Accession #:    [id number]       Weight:       199.1 lb Date of Birth:  1945-10-29         BSA:          2.083 m Patient Age:    53 years         BP:            138/70 mmHg Patient Gender: M                HR:           77 bpm. Exam Location:  [facility] Procedure: 2D Echo, Cardiac Doppler and Color Doppler Indications:    Syncope R55  History:        Patient has prior history of Echocardiogram examinations, most                 recent 05/18/2016. Cardiomyopathy and CHF; Risk Factors:Diabetes,                 Hypertension and Dyslipidemia. Obstructive sleep apnea.  Sonographer:    Alvino Chapel RCS Referring Phys: [id number] ASIA B [place]  1. Left ventricular ejection fraction, by estimation, is 55 to 60%. The left ventricle has normal function. The left ventricle has no regional wall motion abnormalities. There is mild left ventricular hypertrophy. Left ventricular diastolic parameters are consistent with Grade I diastolic dysfunction (impaired relaxation).  2. Right ventricular systolic function is normal. The right ventricular size is normal.  3. Left atrial size was mildly dilated.  4. The mitral valve is normal in structure. No evidence of mitral valve regurgitation. No evidence of mitral stenosis.  5. The aortic valve is tricuspid. Aortic valve regurgitation is mild. No aortic stenosis is present.  6. The inferior vena cava is normal in size with greater than 50% respiratory variability, suggesting right atrial pressure of 3 mmHg. FINDINGS  Left Ventricle: Left ventricular ejection fraction, by estimation, is 55 to 60%. The left ventricle has normal function. The left ventricle has no regional wall motion abnormalities. The left ventricular internal cavity size was normal in size. There is  mild left ventricular hypertrophy. Left ventricular diastolic parameters are consistent with Grade I diastolic dysfunction (impaired relaxation). Right Ventricle: The right ventricular size is normal. Right ventricular systolic function is normal. Left Atrium: Left atrial size was mildly dilated. Right Atrium: Right atrial size was normal in size. Pericardium: There  is no evidence of pericardial effusion. Mitral Valve: The mitral valve is normal in structure. No evidence of mitral valve regurgitation. No evidence of mitral valve stenosis. Tricuspid Valve: The tricuspid valve is normal in structure. Tricuspid valve regurgitation is trivial. No evidence of tricuspid stenosis. Aortic Valve: The aortic valve is tricuspid. Aortic valve regurgitation is mild. Aortic regurgitation PHT measures 337 msec. No aortic stenosis is present. Pulmonic Valve: The pulmonic valve was not well visualized. Pulmonic valve regurgitation is not visualized. No evidence of pulmonic stenosis. Aorta: The aortic root is normal in size and structure. Venous: The inferior vena cava is normal in size with greater than 50% respiratory variability, suggesting right atrial pressure of 3 mmHg. IAS/Shunts: The interatrial septum is aneurysmal. No atrial level shunt detected by color flow Doppler.  LEFT VENTRICLE PLAX 2D LVIDd:         5.20 cm  Diastology LVIDs:         3.90 cm  LV e' medial:    5.66 cm/s LV  PW:         1.30 cm  LV E/e' medial:  11.5 LV IVS:        1.30 cm  LV e' lateral:   6.09 cm/s LVOT diam:     2.40 cm  LV E/e' lateral: 10.7 LV SV:         99 LV SV Index:   47 LVOT Area:     4.52 cm  RIGHT VENTRICLE RV S prime:     16.30 cm/s TAPSE (M-mode): 2.1 cm LEFT ATRIUM             Index       RIGHT ATRIUM           Index LA diam:        4.20 cm 2.02 cm/m  RA Area:     22.20 cm LA Vol (A2C):   95.4 ml 45.79 ml/m RA Volume:   71.00 ml  34.08 ml/m LA Vol (A4C):   69.8 ml 33.51 ml/m LA Biplane Vol: 86.3 ml 41.43 ml/m  AORTIC VALVE LVOT Vmax:   90.70 cm/s LVOT Vmean:  67.500 cm/s LVOT VTI:    0.218 m AI PHT:      337 msec  AORTA Ao Root diam: 3.50 cm MITRAL VALVE MV Area (PHT): 4.65 cm     SHUNTS MV Decel Time: 163 msec     Systemic VTI:  0.22 m MV E velocity: 65.10 cm/s   Systemic Diam: 2.40 cm MV A velocity: 105.00 cm/s MV E/A ratio:  0.62 Kirk Ruths MD Electronically signed by Kirk Ruths  MD Signature Date/Time: 02/16/2021/12:21:47 PM    Final       Subjective: Pt reports that he feels well.  He has been ambulating but remains very weak, no further falls.  Agreeable to SNF rehab.    Discharge Exam: Vitals:   02/16/21 2014 02/17/21 0521  BP: 121/67 (!) 150/78  Pulse: 77 73  Resp: 18 18  Temp: 98.1 F (36.7 C) 98 F (36.7 C)  SpO2: 97% 97%   Vitals:   02/16/21 1200 02/16/21 1616 02/16/21 2014 02/17/21 0521  BP: 123/64 (!) 146/72 121/67 (!) 150/78  Pulse: 72 81 77 73  Resp: 18 18 18 18   Temp: 98.7 F (37.1 C) 98.6 F (37 C) 98.1 F (36.7 C) 98 F (36.7 C)  TempSrc: Oral Oral Oral Oral  SpO2: 100% 96% 97% 97%  Weight:    90.5 kg  Height:       General: Pt is alert, awake, not in acute distress Cardiovascular: normal S1/S2 +, no rubs, no gallops Respiratory: CTA bilaterally, no wheezing, no rhonchi Abdominal: Soft, NT, ND, bowel sounds + Extremities: no edema, no cyanosis Neurological: nonfocal exam.     The results of significant diagnostics from this hospitalization (including imaging, microbiology, ancillary and laboratory) are listed below for reference.     Microbiology: Recent Results (from the past 240 hour(s))  Resp Panel by RT-PCR (Flu A&B, Covid) Nasopharyngeal Swab     Status: None   Collection Time: 02/16/21  5:39 AM   Specimen: Nasopharyngeal Swab; Nasopharyngeal(NP) swabs in vial transport medium  Result Value Ref Range Status   SARS Coronavirus 2 by RT PCR NEGATIVE NEGATIVE Final    Comment: (NOTE) SARS-CoV-2 target nucleic acids are NOT DETECTED.  The SARS-CoV-2 RNA is generally detectable in upper respiratory specimens during the acute phase of infection. The lowest concentration of SARS-CoV-2 viral copies this assay can detect is 138 copies/mL. A negative  result does not preclude SARS-Cov-2 infection and should not be used as the sole basis for treatment or other patient management decisions. A negative result may occur with   improper specimen collection/handling, submission of specimen other than nasopharyngeal swab, presence of viral mutation(s) within the areas targeted by this assay, and inadequate number of viral copies(<138 copies/mL). A negative result must be combined with clinical observations, patient history, and epidemiological information. The expected result is Negative.  Fact Sheet for Patients:  EntrepreneurPulse.com.au  Fact Sheet for Healthcare Providers:  IncredibleEmployment.be  This test is no t yet approved or cleared by the [place] FDA and  has been authorized for detection and/or diagnosis of SARS-CoV-2 by FDA under an Emergency Use Authorization (EUA). This EUA will remain  in effect (meaning this test can be used) for the duration of the COVID-19 declaration under Section 564(b)(1) of the Act, 21 U.S.C.section 360bbb-3(b)(1), unless the authorization is terminated  or revoked sooner.       Influenza A by PCR NEGATIVE NEGATIVE Final   Influenza B by PCR NEGATIVE NEGATIVE Final    Comment: (NOTE) The Xpert Xpress SARS-CoV-2/FLU/RSV plus assay is intended as an aid in the diagnosis of influenza from Nasopharyngeal swab specimens and should not be used as a sole basis for treatment. Nasal washings and aspirates are unacceptable for Xpert Xpress SARS-CoV-2/FLU/RSV testing.  Fact Sheet for Patients: EntrepreneurPulse.com.au  Fact Sheet for Healthcare Providers: IncredibleEmployment.be  This test is not yet approved or cleared by the [place] FDA and has been authorized for detection and/or diagnosis of SARS-CoV-2 by FDA under an Emergency Use Authorization (EUA). This EUA will remain in effect (meaning this test can be used) for the duration of the COVID-19 declaration under Section 564(b)(1) of the Act, 21 U.S.C. section 360bbb-3(b)(1), unless the authorization is terminated  or revoked.  Performed at [facility], [address] [place], [place] [address]      Labs: BNP (last 3 results) No results for input(s): BNP in the last 8760 hours. Basic Metabolic Panel: Recent Labs  Lab 02/15/21 2347 02/17/21 0627  NA 136 136  K 3.2* 3.4*  CL 98 105  CO2 29 25  GLUCOSE 94 90  BUN 17 9  CREATININE 0.87 0.53*  CALCIUM 8.8* 8.4*  MG  --  1.8   Liver Function Tests: Recent Labs  Lab 02/15/21 2347 02/17/21 0627  AST 18 17  ALT 16 14  ALKPHOS 67 57  BILITOT 0.9 0.9  PROT 7.3 6.4*  ALBUMIN 4.0 3.4*   No results for input(s): LIPASE, AMYLASE in the last 168 hours. No results for input(s): AMMONIA in the last 168 hours. CBC: Recent Labs  Lab 02/15/21 2347 02/17/21 0627  WBC 8.7 8.4  NEUTROABS 6.8  --   HGB 10.6* 10.1*  HCT 32.1* 30.0*  MCV 100.0 100.7*  PLT 225 210   Cardiac Enzymes: No results for input(s): CKTOTAL, CKMB, CKMBINDEX, TROPONINI in the last 168 hours. BNP: Invalid input(s): POCBNP CBG: Recent Labs  Lab 02/16/21 1107 02/16/21 1232 02/16/21 1618 02/16/21 2104 02/17/21 0822  GLUCAP 151* 138* 141* 205* 81   D-Dimer No results for input(s): DDIMER in the last 72 hours. Hgb A1c No results for input(s): HGBA1C in the last 72 hours. Lipid Profile No results for input(s): CHOL, HDL, LDLCALC, TRIG, CHOLHDL, LDLDIRECT in the last 72 hours. Thyroid function studies Recent Labs    02/17/21 0627  TSH 2.445   Anemia work up No results for input(s): VITAMINB12, FOLATE,  FERRITIN, TIBC, IRON, RETICCTPCT in the last 72 hours. Urinalysis    Component Value Date/Time   COLORURINE YELLOW 02/16/2021 0101   APPEARANCEUR HAZY (A) 02/16/2021 0101   LABSPEC 1.011 02/16/2021 0101   PHURINE 6.0 02/16/2021 0101   GLUCOSEU NEGATIVE 02/16/2021 0101   HGBUR NEGATIVE 02/16/2021 0101   BILIRUBINUR NEGATIVE 02/16/2021 0101   KETONESUR NEGATIVE 02/16/2021 0101   PROTEINUR NEGATIVE 02/16/2021 0101   UROBILINOGEN 0.2 11/23/2013 1530    NITRITE NEGATIVE 02/16/2021 0101   LEUKOCYTESUR NEGATIVE 02/16/2021 0101   Sepsis Labs Invalid input(s): PROCALCITONIN,  WBC,  LACTICIDVEN Microbiology Recent Results (from the past 240 hour(s))  Resp Panel by RT-PCR (Flu A&B, Covid) Nasopharyngeal Swab     Status: None   Collection Time: 02/16/21  5:39 AM   Specimen: Nasopharyngeal Swab; Nasopharyngeal(NP) swabs in vial transport medium  Result Value Ref Range Status   SARS Coronavirus 2 by RT PCR NEGATIVE NEGATIVE Final    Comment: (NOTE) SARS-CoV-2 target nucleic acids are NOT DETECTED.  The SARS-CoV-2 RNA is generally detectable in upper respiratory specimens during the acute phase of infection. The lowest concentration of SARS-CoV-2 viral copies this assay can detect is 138 copies/mL. A negative result does not preclude SARS-Cov-2 infection and should not be used as the sole basis for treatment or other patient management decisions. A negative result may occur with  improper specimen collection/handling, submission of specimen other than nasopharyngeal swab, presence of viral mutation(s) within the areas targeted by this assay, and inadequate number of viral copies(<138 copies/mL). A negative result must be combined with clinical observations, patient history, and epidemiological information. The expected result is Negative.  Fact Sheet for Patients:  EntrepreneurPulse.com.au  Fact Sheet for Healthcare Providers:  IncredibleEmployment.be  This test is no t yet approved or cleared by the [place] FDA and  has been authorized for detection and/or diagnosis of SARS-CoV-2 by FDA under an Emergency Use Authorization (EUA). This EUA will remain  in effect (meaning this test can be used) for the duration of the COVID-19 declaration under Section 564(b)(1) of the Act, 21 U.S.C.section 360bbb-3(b)(1), unless the authorization is terminated  or revoked sooner.       Influenza A by PCR  NEGATIVE NEGATIVE Final   Influenza B by PCR NEGATIVE NEGATIVE Final    Comment: (NOTE) The Xpert Xpress SARS-CoV-2/FLU/RSV plus assay is intended as an aid in the diagnosis of influenza from Nasopharyngeal swab specimens and should not be used as a sole basis for treatment. Nasal washings and aspirates are unacceptable for Xpert Xpress SARS-CoV-2/FLU/RSV testing.  Fact Sheet for Patients: EntrepreneurPulse.com.au  Fact Sheet for Healthcare Providers: IncredibleEmployment.be  This test is not yet approved or cleared by the [place] FDA and has been authorized for detection and/or diagnosis of SARS-CoV-2 by FDA under an Emergency Use Authorization (EUA). This EUA will remain in effect (meaning this test can be used) for the duration of the COVID-19 declaration under Section 564(b)(1) of the Act, 21 U.S.C. section 360bbb-3(b)(1), unless the authorization is terminated or revoked.  Performed at [facility], [address], [place], [place] [address]     Time coordinating discharge:   SIGNED:  Irwin Brakeman, MD  [facility] 02/17/2021, 12:09 PM How to contact the [facility] Attending or Consulting provider [place] or covering provider during after hours [place], for this patient?  1. Check the care team in [facility] and look for a) attending/consulting TRH provider listed and b) the [facility] team listed 2. Log into  [email or web address] and use [place]'s universal password to access. If you do not have the password, please contact the hospital operator. 3. Locate the TRH provider you are looking for under [facility] and page to a number that you can be directly reached. 4. If you still have difficulty reaching the provider, please page the DOC (Director on Call) for the Hospitalists listed on amion for assistance.  

## 2021-02-17 NOTE — TOC Transition Note (Signed)
Transition of Care [facility]) - CM/SW Discharge Note   Patient Details  Name: [name] MRN: [id number] Date of Birth: 30-Apr-1946  Transition of Care [facility]) CM/SW Contact:  Boneta Lucks, RN Phone Number: 02/17/2021, 2:29 PM   Clinical Narrative:   Patient accepted bed offer at [facility]. TOC started INS AUTH with [employer]. Auth received [id number] for SNF and [phone] for EMS. Patient has been vaccinated for COVID. RN to call report to [place], room # I5-1. Medical necessity fill out, RN to call EMS when ready.     Final next level of care: Skilled Nursing Facility Barriers to Discharge: Barriers Resolved   Patient Goals and CMS Choice Patient states their goals for this hospitalization and ongoing recovery are:: rehab then home CMS Medicare.gov Compare Post Acute Care list provided to:: Patient    Discharge Placement              Patient chooses bed at:  [facility]) Patient to be transferred to facility by: EMS   Patient and family notified of of transfer: 02/17/21  Discharge Plan and [place] Choice: [facility]

## 2021-02-17 NOTE — Care Management Obs Status (Signed)
[place] NOTIFICATION   Patient Details  Name: [name] MRN: [id number] Date of Birth: [birth year]   Medicare Observation Status Notification Given:  Yes    Boneta Lucks, RN 02/17/2021, 12:50 PM

## 2021-02-18 ENCOUNTER — Telehealth: Payer: Self-pay

## 2021-02-18 LAB — GLUCOSE, CAPILLARY: Glucose-Capillary: 173 mg/dL — ABNORMAL HIGH (ref 70–99)

## 2021-02-18 NOTE — Telephone Encounter (Signed)
Pt was transferred to [facility].

## 2021-02-19 ENCOUNTER — Ambulatory Visit: Payer: HMO | Admitting: Orthopedic Surgery

## 2021-02-19 DIAGNOSIS — G2581 Restless legs syndrome: Secondary | ICD-10-CM | POA: Diagnosis not present

## 2021-02-19 DIAGNOSIS — R55 Syncope and collapse: Secondary | ICD-10-CM | POA: Diagnosis not present

## 2021-02-19 DIAGNOSIS — K219 Gastro-esophageal reflux disease without esophagitis: Secondary | ICD-10-CM | POA: Diagnosis not present

## 2021-02-19 DIAGNOSIS — Z79899 Other long term (current) drug therapy: Secondary | ICD-10-CM | POA: Diagnosis not present

## 2021-02-19 DIAGNOSIS — E1169 Type 2 diabetes mellitus with other specified complication: Secondary | ICD-10-CM | POA: Diagnosis not present

## 2021-02-19 DIAGNOSIS — E785 Hyperlipidemia, unspecified: Secondary | ICD-10-CM | POA: Diagnosis not present

## 2021-02-19 DIAGNOSIS — I10 Essential (primary) hypertension: Secondary | ICD-10-CM | POA: Diagnosis not present

## 2021-02-19 DIAGNOSIS — E039 Hypothyroidism, unspecified: Secondary | ICD-10-CM | POA: Diagnosis not present

## 2021-02-24 DIAGNOSIS — R55 Syncope and collapse: Secondary | ICD-10-CM | POA: Diagnosis not present

## 2021-02-25 ENCOUNTER — Ambulatory Visit: Payer: PPO | Admitting: Adult Health

## 2021-02-26 ENCOUNTER — Telehealth (HOSPITAL_COMMUNITY): Payer: Self-pay

## 2021-02-26 NOTE — Telephone Encounter (Signed)
Returned call to [place] at [facility] concerning wound care.  [name], LPTA/CLT; Delana Meyer [phone]

## 2021-03-04 DIAGNOSIS — E119 Type 2 diabetes mellitus without complications: Secondary | ICD-10-CM | POA: Diagnosis not present

## 2021-03-04 DIAGNOSIS — E039 Hypothyroidism, unspecified: Secondary | ICD-10-CM | POA: Diagnosis not present

## 2021-03-05 DIAGNOSIS — I10 Essential (primary) hypertension: Secondary | ICD-10-CM | POA: Diagnosis not present

## 2021-03-05 DIAGNOSIS — R7309 Other abnormal glucose: Secondary | ICD-10-CM | POA: Diagnosis not present

## 2021-03-05 DIAGNOSIS — G4733 Obstructive sleep apnea (adult) (pediatric): Secondary | ICD-10-CM | POA: Diagnosis not present

## 2021-03-05 DIAGNOSIS — I509 Heart failure, unspecified: Secondary | ICD-10-CM | POA: Diagnosis not present

## 2021-03-05 DIAGNOSIS — K219 Gastro-esophageal reflux disease without esophagitis: Secondary | ICD-10-CM | POA: Diagnosis not present

## 2021-03-05 DIAGNOSIS — F32A Depression, unspecified: Secondary | ICD-10-CM | POA: Diagnosis not present

## 2021-03-05 DIAGNOSIS — E119 Type 2 diabetes mellitus without complications: Secondary | ICD-10-CM | POA: Diagnosis not present

## 2021-03-05 DIAGNOSIS — E039 Hypothyroidism, unspecified: Secondary | ICD-10-CM | POA: Diagnosis not present

## 2021-03-09 DIAGNOSIS — F32A Depression, unspecified: Secondary | ICD-10-CM | POA: Diagnosis not present

## 2021-03-09 DIAGNOSIS — E119 Type 2 diabetes mellitus without complications: Secondary | ICD-10-CM | POA: Diagnosis not present

## 2021-03-09 DIAGNOSIS — K219 Gastro-esophageal reflux disease without esophagitis: Secondary | ICD-10-CM | POA: Diagnosis not present

## 2021-03-09 DIAGNOSIS — I10 Essential (primary) hypertension: Secondary | ICD-10-CM | POA: Diagnosis not present

## 2021-03-09 DIAGNOSIS — G4733 Obstructive sleep apnea (adult) (pediatric): Secondary | ICD-10-CM | POA: Diagnosis not present

## 2021-03-09 DIAGNOSIS — E039 Hypothyroidism, unspecified: Secondary | ICD-10-CM | POA: Diagnosis not present

## 2021-03-09 DIAGNOSIS — E785 Hyperlipidemia, unspecified: Secondary | ICD-10-CM | POA: Diagnosis not present

## 2021-03-09 DIAGNOSIS — E1169 Type 2 diabetes mellitus with other specified complication: Secondary | ICD-10-CM | POA: Diagnosis not present

## 2021-03-14 ENCOUNTER — Other Ambulatory Visit: Payer: Self-pay | Admitting: Family Medicine

## 2021-03-14 ENCOUNTER — Other Ambulatory Visit: Payer: Self-pay | Admitting: Cardiovascular Disease

## 2021-03-18 ENCOUNTER — Ambulatory Visit (INDEPENDENT_AMBULATORY_CARE_PROVIDER_SITE_OTHER): Payer: HMO | Admitting: Nurse Practitioner

## 2021-03-18 ENCOUNTER — Encounter: Payer: Self-pay | Admitting: Nurse Practitioner

## 2021-03-18 ENCOUNTER — Encounter: Payer: Self-pay | Admitting: *Deleted

## 2021-03-18 ENCOUNTER — Other Ambulatory Visit: Payer: Self-pay

## 2021-03-18 ENCOUNTER — Telehealth: Payer: Self-pay

## 2021-03-18 VITALS — BP 118/62 | HR 82 | Resp 18 | Ht 70.0 in | Wt 191.2 lb

## 2021-03-18 DIAGNOSIS — I10 Essential (primary) hypertension: Secondary | ICD-10-CM

## 2021-03-18 DIAGNOSIS — R269 Unspecified abnormalities of gait and mobility: Secondary | ICD-10-CM | POA: Diagnosis not present

## 2021-03-18 DIAGNOSIS — M25551 Pain in right hip: Secondary | ICD-10-CM | POA: Diagnosis not present

## 2021-03-18 DIAGNOSIS — E08621 Diabetes mellitus due to underlying condition with foot ulcer: Secondary | ICD-10-CM | POA: Diagnosis not present

## 2021-03-18 DIAGNOSIS — L97411 Non-pressure chronic ulcer of right heel and midfoot limited to breakdown of skin: Secondary | ICD-10-CM | POA: Diagnosis not present

## 2021-03-18 MED ORDER — CARVEDILOL 3.125 MG PO TABS: 1.5625 mg | ORAL_TABLET | Freq: Two times a day (BID) | ORAL | 3 refills | Status: DC

## 2021-03-18 NOTE — Assessment & Plan Note (Signed)
-  has rollator and cane -he was supposed to get a few more weeks of physical therapy after leaving [facility], but states no one has followed up -referral to PT

## 2021-03-18 NOTE — Assessment & Plan Note (Signed)
-  he states he is asymptomatic, but has low BP this AM of 83/42 on his wrist cuff at home; clinic BP looks great -no orthostatic hypotension, but his BP was 108/58 sitting and 104/52 standing -Cut carvedilol dose in half since systolic is < 60 -he will keep a log of BPs at least 3 times per day; will let cardiology change medications further, if needed -he will return to clinic if BP consistently < 100/60 or he has symptoms

## 2021-03-18 NOTE — Telephone Encounter (Signed)
Pat from [facility] called said the medicine carvedilol (COREG) 3.125 MG tablet the directions take 1/2 pill 2 times a day.  Tablets are so tiny and not sure if the pills can be cut in half.  Please contact Pat  at [facility].

## 2021-03-18 NOTE — Assessment & Plan Note (Signed)
Causes mobility issues

## 2021-03-18 NOTE — Telephone Encounter (Signed)
error 

## 2021-03-18 NOTE — Progress Notes (Signed)
Established Patient Office Visit  Subjective:  Patient ID: [name], male    DOB: 05-May-1946  Age: 75 y.o. MRN: [id number]  CC:  Chief Complaint  Patient presents with   Hospitalization Follow-up    Pt was in [facility] had a fall then went to [place] was dicharged 6-20 has been a little better since being home was supposed to get 2 weeks for in home therapy but never heard anything then he wants to go back to [facility] outpatient rehab also has a wound on his right foot would like looked at not sure if this has healed up good     HPI [name] presents for TOC. Discharged from [facility] on 6/20. No discharge note from [facility] available.  He was admitted to [facility] for syncope, orthostasis (d/t dehydration), and minor head injury. He was discharged to [facility] for rehabilitation. He states his dizziness has resolved and he has not had any recent syncopal episodes.  He has a continuous glucose monitor, [employer], and he started using it within the last week. He is monitoring blood sugar at least 4 times per day.  He has right foot wound to bottom of his foot. He would like to go to wound care until this is 100% healed.   Past Medical History:  Diagnosis Date   Aftercare following surgery of the circulatory system, NEC 12/04/2013   Angina decubitus (HCC) 05/19/2016   Arrhythmia 05/20/2016   Arthritis    ARTHRITIS, RIGHT FOOT 06/26/2008   Qualifier: Diagnosis of  By: [name] MD, [name]     Cardiomyopathy- EF NL 01/2013, now 30-35% echo 03/14/2012   Carotid artery occlusion    left s/p CEA   Cellulitis of left foot 05/07/2018   CHF (congestive heart failure) (HCC)    Critical lower limb ischemia (HCC) 02/28/2014   Critical limb ischemia    Diabetes mellitus    Elevated troponin 04/11/2016   GERD (gastroesophageal reflux disease)    History of stroke June 2013 03/14/2012   HOH (hard of hearing)    Hypertension    Hypothyroidism    Nonhealing skin ulcer (HCC)  10/28/2014   Obstructive sleep apnea    Orthostatic hypotension    Osteomyelitis (HCC)    left great toe   Pain in the chest 05/17/2016   Peripheral arterial disease (HCC), s/p PTA x 2 RLE    nonhealing ulcers bilaterally on each great toe   Pneumonia    PONV (postoperative nausea and vomiting)    Pulmonary nodule 05/17/2016   Restless leg syndrome    Shortness of breath    Stroke [facility]) March 08, 2012   Toe osteomyelitis, left [facility])    Ulcer of great toe, left, with necrosis of bone (HCC)    Wears dentures     Past Surgical History:  Procedure Laterality Date   AMPUTATION TOE Left 07/25/2018   Procedure: AMPUTATION TOE INTERPHALANGEAL HALLUX LEFT;  Surgeon: Park Liter, DPM;  Location: [facility] OR;  Service: Podiatry;  Laterality: Left;   ANGIOPLASTY  02/28/14   diamond back orbital rotational atherectomy of Rt. tibial   BACK SURGERY     BONE BIOPSY Left 07/25/2018   Procedure: SUPERFICIAL BONE BIOPSY;  Surgeon: Park Liter, DPM;  Location: [facility] OR;  Service: Podiatry;  Laterality: Left;   CARDIAC CATHETERIZATION N/A 05/19/2016   Procedure: Right/Left Heart Cath and Coronary Angiography;  Surgeon: [name], MD;  Location: [facility] INVASIVE CV LAB;  Service: Cardiovascular;  Laterality: N/A;   CERVICAL FUSION     ENDARTERECTOMY Left 11/29/2013   Procedure: ENDARTERECTOMY CAROTID;  Surgeon: Serafina Mitchell, MD;  Location: [place];  Service: Vascular;  Laterality: Left;   ESOPHAGOGASTRODUODENOSCOPY  02/2010   Dr. Gala Romney: patient presented with food impaction, schatzki ring with superimposed component of stricture with erosive reflux esophagitis, s/p disimpaction but dilation planned at later date    ESOPHAGOGASTRODUODENOSCOPY (EGD) WITH PROPOFOL N/A 08/13/2019   Dr. Gala Romney: Erosive reflux esophagitis with mild stricture and incidental Mallory-Weiss tear which precluded esophageal dilation.  Medium sized hiatal hernia.   ESOPHAGOGASTRODUODENOSCOPY (EGD) WITH PROPOFOL N/A 10/11/2019   Dr. Gala Romney:  Esophageal stenosis status post dilation, moderate hiatal hernia   EYE SURGERY     FLEXIBLE SIGMOIDOSCOPY N/A 08/13/2019   Procedure: FLEXIBLE SIGMOIDOSCOPY;  Surgeon: [name], MD;  Location: [facility] ENDO SUITE;  Service: Endoscopy;  Laterality: N/A;  colonoscopy aboerted due to formed stool and poor prep   FOOT SURGERY     KNEE ARTHROSCOPY WITH MEDIAL MENISECTOMY Left 05/06/2020   Procedure: KNEE ARTHROSCOPY WITH MEDIAL MENISCECTOMY AND LATERAL MENISCECTOMY;  Surgeon: [name], MD;  Location: [facility] ORS;  Service: Orthopedics;  Laterality: Left;   Lower ext duplex doppler  03/14/14   Rt ABI 1.2   LOWER EXTREMITY ANGIOGRAM Bilateral 02/18/2014   Procedure: LOWER EXTREMITY ANGIOGRAM;  Surgeon: [name], MD;  Location: [facility] CATH LAB;  Service: Cardiovascular;  Laterality: Bilateral;   LOWER EXTREMITY ANGIOGRAM N/A 10/31/2014   Procedure: LOWER EXTREMITY ANGIOGRAM;  Surgeon: [name], MD;  Location: [facility] CATH LAB;  Service: Cardiovascular;  Laterality: N/A;   MALONEY DILATION N/A 10/11/2019   Procedure: Venia Minks DILATION;  Surgeon: [name], MD;  Location: [facility] ENDO SUITE;  Service: Endoscopy;  Laterality: N/A;   MULTIPLE TOOTH EXTRACTIONS     PV angiogram  02/18/2014   tibial vessel diseas bil.   SPINE SURGERY     tendon achillies lengthing and sesamoid      Family History  Problem Relation Age of Onset   Heart disease Mother    Hypertension Mother    Heart attack Mother    Hypertension Father    Diabetes Father    Diabetes Son    Heart disease Son    Hypertension Son    Colon cancer Neg Hx     Social History   Socioeconomic History   Marital status: Married    Spouse name: [name]    Number of children: 4   Years of education: college   Highest education level: Not on file  Occupational History   Occupation: Firefighter   Tobacco Use   Smoking status: Former    Pack years: 0.00    Types: Pipe    Quit date: 07/08/1977    Years since quitting: 43.7    Smokeless tobacco: Never  Vaping Use   Vaping Use: Never used  Substance and Sexual Activity   Alcohol use: No    Alcohol/week: 0.0 standard drinks   Drug use: No   Sexual activity: Not on file  Other Topics Concern   Not on file  Social History Narrative   Retired from [employer] social research officer -[employer]      Lives with [name]    Cat: Jazz      Enjoy: sleeping a lot       Diet: eats all food groups -chicken, steak-chopped, mostly veggie   Caffeine: coffee and soda "a lot"   Water: 3-4 cups daily  Wears seat belt   Does not use phone while driving    Smoke detectors at home    weapons at home        Social Determinants of Health   Financial Resource Strain: Not on file  Food Insecurity: No Food Insecurity   Worried About Charity fundraiser in the Last Year: Never true   Arboriculturist in the Last Year: Never true  Transportation Needs: No Transportation Needs   Lack of Transportation (Medical): No   Lack of Transportation (Non-Medical): No  Physical Activity: Not on file  Stress: Not on file  Social Connections: Moderately Isolated   Frequency of Communication with Friends and Family: Three times a week   Frequency of Social Gatherings with Friends and Family: Three times a week   Attends Religious Services: Never   Active Member of Clubs or Organizations: No   Attends Archivist Meetings: Never   Marital Status: Married  Human resources officer Violence: Not on file    Outpatient Medications Prior to Visit  Medication Sig Dispense Refill   aspirin EC 81 MG EC tablet Take 1 tablet (81 mg total) by mouth daily. (Patient taking differently: Take 81 mg by mouth at bedtime.)     b complex vitamins tablet Take 1 tablet by mouth daily.     Cholecalciferol (VITAMIN D3) 5000 units TABS Take 5,000 Units by mouth 2 (two) times daily.      clopidogrel (PLAVIX) 75 MG tablet TAKE ONE (1) TABLET BY MOUTH EVERY DAY 90 tablet 0   Continuous Blood Gluc Receiver (FREESTYLE  LIBRE 14 DAY READER) DEVI      Continuous Blood Gluc Sensor (FREESTYLE LIBRE 14 DAY SENSOR) MISC Inject 1 each into the skin every 14 (fourteen) days. 2 each 5   Cyanocobalamin 1500 MCG TBDP Take 1,500 mcg by mouth daily.      DULoxetine (CYMBALTA) 60 MG capsule Take 1 capsule (60 mg total) by mouth daily. 90 capsule 1   empagliflozin (JARDIANCE) 25 MG TABS tablet Take 1 tablet (25 mg total) by mouth daily before breakfast. 90 tablet 1   furosemide (LASIX) 40 MG tablet Take 1 tablet (40 mg total) by mouth daily.     levothyroxine (SYNTHROID) 88 MCG tablet Take 1 tablet (88 mcg total) by mouth daily. 90 tablet 3   linagliptin (TRADJENTA) 5 MG TABS tablet Take 1 tablet (5 mg total) by mouth daily. 30 tablet    Multiple Vitamin (MULTIVITAMIN WITH MINERALS) TABS tablet Take 1 tablet by mouth daily.     nitroGLYCERIN (NITROSTAT) 0.4 MG SL tablet Place 1 tablet (0.4 mg total) under the tongue every 5 (five) minutes as needed for chest pain. 25 tablet 3   pantoprazole (PROTONIX) 40 MG tablet TAKE ONE (1) TABLET BY MOUTH EVERY DAY (Patient taking differently: Take 40 mg by mouth daily.) 30 tablet 11   potassium chloride (KLOR-CON) 10 MEQ tablet TAKE ONE TABLET (10MEQ TOTAL) BY MOUTH DAILY 30 tablet 0   pramipexole (MIRAPEX) 0.25 MG tablet Take 2 tablets (0.5 mg total) by mouth 2 (two) times daily. Take 0.$RemoveBefor'5mg'vIgihFUMSzcE$  by mouth at 7 PM and 0.$Remove'5mg'bzHDYVZ$  at 9 PM daily. 360 tablet 3   rosuvastatin (CRESTOR) 20 MG tablet Take 1 tablet (20 mg total) by mouth daily. 90 tablet 1   UNABLE TO FIND Home health supplies for CPAP 1 Product 0   UNABLE TO FIND [place] sock aid to help with support hose. Size XL.  Dx:I73.9, G25.81, R60.9 1  each 0   carvedilol (COREG) 3.125 MG tablet Take 1 tablet (3.125 mg total) by mouth 2 (two) times daily with a meal. 180 tablet 3   No facility-administered medications prior to visit.    Allergies  Allergen Reactions   Codeine Nausea And Vomiting   Tramadol Nausea Only    ROS Review of  Systems  Constitutional: Negative.   Respiratory: Negative.    Cardiovascular: Negative.   Musculoskeletal:  Positive for gait problem.       Weakness; using rollator or cane  Neurological:  Negative for syncope and light-headedness.  Psychiatric/Behavioral: Negative.       Objective:    Physical Exam Constitutional:      Appearance: Normal appearance.  Cardiovascular:     Rate and Rhythm: Normal rate and regular rhythm.     Pulses: Normal pulses.     Heart sounds: Normal heart sounds.  Pulmonary:     Effort: Pulmonary effort is normal.     Breath sounds: Normal breath sounds.  Musculoskeletal:     Comments: Quad atrophy; using rollator today  Skin:    Comments: Right foot wound- pressure ulcer, healing  Neurological:     Mental Status: He is alert.  Psychiatric:        Mood and Affect: Mood normal.        Behavior: Behavior normal.        Thought Content: Thought content normal.        Judgment: Judgment normal.    BP 118/62 (BP Location: Right Arm, Patient Position: Sitting, Cuff Size: Normal)   Pulse 82   Resp 18   Ht $R'5\' 10"'cE$  (1.778 m)   Wt 191 lb 3.2 oz (86.7 kg)   SpO2 99%   BMI 27.43 kg/m  Wt Readings from Last 3 Encounters:  03/18/21 191 lb 3.2 oz (86.7 kg)  02/17/21 199 lb 8.3 oz (90.5 kg)  02/11/21 199 lb 9.6 oz (90.5 kg)     Health Maintenance Due  Topic Date Due   Zoster Vaccines- Shingrix (1 of 2) Never done   COVID-19 Vaccine (4 - Booster for Moderna series) 12/20/2020   OPHTHALMOLOGY EXAM  02/07/2021    There are no preventive care reminders to display for this patient.  Lab Results  Component Value Date   TSH 2.445 02/17/2021   Lab Results  Component Value Date   WBC 8.4 02/17/2021   HGB 10.1 (L) 02/17/2021   HCT 30.0 (L) 02/17/2021   MCV 100.7 (H) 02/17/2021   PLT 210 02/17/2021   Lab Results  Component Value Date   NA 136 02/17/2021   K 3.4 (L) 02/17/2021   CO2 25 02/17/2021   GLUCOSE 90 02/17/2021   BUN 9 02/17/2021    CREATININE 0.53 (L) 02/17/2021   BILITOT 0.9 02/17/2021   ALKPHOS 57 02/17/2021   AST 17 02/17/2021   ALT 14 02/17/2021   PROT 6.4 (L) 02/17/2021   ALBUMIN 3.4 (L) 02/17/2021   CALCIUM 8.4 (L) 02/17/2021   ANIONGAP 6 02/17/2021   EGFR 89 01/26/2021   Lab Results  Component Value Date   CHOL 149 01/26/2021   Lab Results  Component Value Date   HDL 46 01/26/2021   Lab Results  Component Value Date   LDLCALC 88 01/26/2021   Lab Results  Component Value Date   TRIG 78 01/26/2021   Lab Results  Component Value Date   CHOLHDL 2.8 09/25/2020   Lab Results  Component Value Date   HGBA1C 9.6 (  H) 01/26/2021      Assessment & Plan:   Problem List Items Addressed This Visit       Cardiovascular and Mediastinum   Hypertension (Chronic)    -he states he is asymptomatic, but has low BP this AM of 83/42 on his wrist cuff at home; clinic BP looks great -no orthostatic hypotension, but his BP was 108/58 sitting and 104/52 standing -Cut carvedilol dose in half since systolic is < 60 -he will keep a log of BPs at least 3 times per day; will let cardiology change medications further, if needed -he will return to clinic if BP consistently < 100/60 or he has symptoms        Relevant Medications   carvedilol (COREG) 3.125 MG tablet     Endocrine   Diabetic foot ulcer (HCC)    -plantar right foot wound -referral to wound care       Relevant Orders   AMB referral to wound care center   Basic metabolic panel   CBC with Differential/Platelet     Other   Gait abnormality - Primary    -has rollator and cane -he was supposed to get a few more weeks of physical therapy after leaving [facility], but states no one has followed up -referral to PT       Relevant Orders   Ambulatory referral to Physical Therapy   Right hip pain    Causes mobility issues       Relevant Orders   Ambulatory referral to Physical Therapy    Meds ordered this encounter  Medications    carvedilol (COREG) 3.125 MG tablet    Sig: Take 0.5 tablets (1.5625 mg total) by mouth 2 (two) times daily with a meal.    Dispense:  180 tablet    Refill:  3    Follow-up: Return in about 6 weeks (around 04/29/2021) for BP check.    Noreene Larsson, NP

## 2021-03-18 NOTE — Telephone Encounter (Signed)
Called Pat at [facility] they are able to split these for pt and will take care of it until pt sees cardiology

## 2021-03-18 NOTE — Assessment & Plan Note (Signed)
-  plantar right foot wound -referral to wound care

## 2021-03-18 NOTE — Telephone Encounter (Signed)
His DBP is < 60, but he has HF, so I'd like to cut back on his current dosage. He sees cardiology in a month, and they may change things around.

## 2021-03-18 NOTE — Telephone Encounter (Signed)
Can they cut it for him?

## 2021-03-18 NOTE — Patient Instructions (Addendum)
Please get labs drawn today.   ###  Cut carvedilol dose in half since systolic BP is < 60  ### Please keep a log of BPs at least 3 times per day; will let cardiology change medications further, if needed Return to clinic if BP consistently < 100/60 or you have symptoms

## 2021-03-19 LAB — CBC WITH DIFFERENTIAL/PLATELET
Basophils Absolute: 0 10*3/uL (ref 0.0–0.2)
Basos: 1 %
EOS (ABSOLUTE): 0.1 10*3/uL (ref 0.0–0.4)
Eos: 1 %
Hematocrit: 33 % — ABNORMAL LOW (ref 37.5–51.0)
Hemoglobin: 11.1 g/dL — ABNORMAL LOW (ref 13.0–17.7)
Immature Grans (Abs): 0 10*3/uL (ref 0.0–0.1)
Immature Granulocytes: 0 %
Lymphocytes Absolute: 1.1 10*3/uL (ref 0.7–3.1)
Lymphs: 20 %
MCH: 32.7 pg (ref 26.6–33.0)
MCHC: 33.6 g/dL (ref 31.5–35.7)
MCV: 97 fL (ref 79–97)
Monocytes Absolute: 0.4 10*3/uL (ref 0.1–0.9)
Monocytes: 7 %
Neutrophils Absolute: 3.8 10*3/uL (ref 1.4–7.0)
Neutrophils: 71 %
Platelets: 174 10*3/uL (ref 150–450)
RBC: 3.39 x10E6/uL — ABNORMAL LOW (ref 4.14–5.80)
RDW: 14.5 % (ref 11.6–15.4)
WBC: 5.3 10*3/uL (ref 3.4–10.8)

## 2021-03-19 LAB — BASIC METABOLIC PANEL
BUN/Creatinine Ratio: 16 (ref 10–24)
BUN: 12 mg/dL (ref 8–27)
CO2: 26 mmol/L (ref 20–29)
Calcium: 9.2 mg/dL (ref 8.6–10.2)
Chloride: 99 mmol/L (ref 96–106)
Creatinine, Ser: 0.75 mg/dL — ABNORMAL LOW (ref 0.76–1.27)
Glucose: 167 mg/dL — ABNORMAL HIGH (ref 65–99)
Potassium: 3.6 mmol/L (ref 3.5–5.2)
Sodium: 141 mmol/L (ref 134–144)
eGFR: 93 mL/min/{1.73_m2} (ref >59)

## 2021-03-19 NOTE — Progress Notes (Signed)
His labs are stable! Keep it up.

## 2021-03-31 ENCOUNTER — Telehealth: Payer: Self-pay

## 2021-03-31 NOTE — Telephone Encounter (Signed)
Pt called stating we should have received labs today. He says he feels as though he has a uti, he has a burning sensation when he urinates, he feels a little disoriented, and wants to know if you will send him in an abt for this? The lady that drew his labs said it looked like he had a uti. Please advise.

## 2021-03-31 NOTE — Telephone Encounter (Signed)
Did that lady get a urine sample? We need to run a U/A and urine culture on it. If I send him in abx, we need the culture to know if it is the right drug for the bug... so we need to get a sample or an order sent to the lady that did labs if she checked urine.

## 2021-03-31 NOTE — Telephone Encounter (Signed)
Spoke with Amy and they did not get a  UC. Pt informed and appt scheduled for tomorrow so we can get this taken care of.

## 2021-04-01 ENCOUNTER — Ambulatory Visit: Payer: PPO | Admitting: Family Medicine

## 2021-04-01 ENCOUNTER — Ambulatory Visit (INDEPENDENT_AMBULATORY_CARE_PROVIDER_SITE_OTHER): Payer: HMO | Admitting: Family Medicine

## 2021-04-01 ENCOUNTER — Encounter: Payer: Self-pay | Admitting: Family Medicine

## 2021-04-01 DIAGNOSIS — R8290 Unspecified abnormal findings in urine: Secondary | ICD-10-CM | POA: Diagnosis not present

## 2021-04-01 NOTE — Patient Instructions (Signed)
Keep August follow-up appointment, call if you need [place]  sooner.  Urine today suggests that you may have an infection.  Since you have no symptoms no antibiotic is being prescribed today.  We will contact you as soon as culture report becomes available as early as in the next 2 days to let you know the report and make any change in management if needed at that time.  Important that you drink at least 64 ounces of water daily and empty your bladder often.  Thanks for choosing [facility], we consider it a privelige to serve you.

## 2021-04-01 NOTE — Assessment & Plan Note (Signed)
Asymptomatic patient, will wait on c/s before prescribing antibiotic Encouraged to ensure adequate water intake and frequent voiding

## 2021-04-01 NOTE — Progress Notes (Signed)
Virtual Visit via Telephone Note  I connected with [name] on 04/01/21 at 11:00 AM EDT by telephone and verified that I am speaking with the correct person using two identifiers.  Location: Patient: home Provider: office   I discussed the limitations, risks, security and privacy concerns of performing an evaluation and management service by telephone and the availability of in person appointments. I also discussed with the patient that there may be a patient responsible charge related to this service. The patient expressed understanding and agreed to proceed.   History of Present Illness: States he has been in a medical study re testosterone and was advised that his UA was abnormal;l and needs to check with primary care Denies frequency , dysuria, fever, chills or flank pain No other concerns addressed  Observations/Objective: There were no vitals taken for this visit. Good communication with no confusion and intact memory. Alert and oriented x 3 No signs of respiratory distress during speech   Assessment and Plan: Abnormal finding on urinalysis Asymptomatic patient, will wait on c/s before prescribing antibiotic Encouraged to ensure adequate water intake and frequent voiding   Follow Up Instructions:    I discussed the assessment and treatment plan with the patient. The patient was provided an opportunity to ask questions and all were answered. The patient agreed with the plan and demonstrated an understanding of the instructions.   The patient was advised to call back or seek an in-person evaluation if the symptoms worsen or if the condition fails to improve as anticipated.  I provided 7 minutes of non-face-to-face time during this encounter.   Tula Nakayama, MD

## 2021-04-01 NOTE — Addendum Note (Signed)
Addended by: Lonn Georgia on: 04/01/2021 01:35 PM   Modules accepted: Orders

## 2021-04-02 ENCOUNTER — Observation Stay (HOSPITAL_COMMUNITY)
Admission: EM | Admit: 2021-04-02 | Discharge: 2021-04-03 | Disposition: A | Discharge status: Home / Self Care | Payer: HMO | Attending: Emergency Medicine | Admitting: Emergency Medicine

## 2021-04-02 ENCOUNTER — Telehealth: Payer: Self-pay | Admitting: *Deleted

## 2021-04-02 ENCOUNTER — Encounter (HOSPITAL_COMMUNITY): Payer: Self-pay | Admitting: *Deleted

## 2021-04-02 ENCOUNTER — Other Ambulatory Visit: Payer: Self-pay

## 2021-04-02 ENCOUNTER — Emergency Department (HOSPITAL_COMMUNITY): Payer: HMO

## 2021-04-02 DIAGNOSIS — Z7982 Long term (current) use of aspirin: Secondary | ICD-10-CM | POA: Insufficient documentation

## 2021-04-02 DIAGNOSIS — J189 Pneumonia, unspecified organism: Principal | ICD-10-CM

## 2021-04-02 DIAGNOSIS — E1159 Type 2 diabetes mellitus with other circulatory complications: Secondary | ICD-10-CM | POA: Diagnosis present

## 2021-04-02 DIAGNOSIS — I5020 Unspecified systolic (congestive) heart failure: Secondary | ICD-10-CM | POA: Diagnosis not present

## 2021-04-02 DIAGNOSIS — Z8673 Personal history of transient ischemic attack (TIA), and cerebral infarction without residual deficits: Secondary | ICD-10-CM | POA: Insufficient documentation

## 2021-04-02 DIAGNOSIS — M545 Low back pain, unspecified: Secondary | ICD-10-CM | POA: Diagnosis present

## 2021-04-02 DIAGNOSIS — I110 Hypertensive heart disease with heart failure: Secondary | ICD-10-CM | POA: Diagnosis not present

## 2021-04-02 DIAGNOSIS — K828 Other specified diseases of gallbladder: Secondary | ICD-10-CM | POA: Diagnosis not present

## 2021-04-02 DIAGNOSIS — K5730 Diverticulosis of large intestine without perforation or abscess without bleeding: Secondary | ICD-10-CM | POA: Diagnosis not present

## 2021-04-02 DIAGNOSIS — I509 Heart failure, unspecified: Secondary | ICD-10-CM

## 2021-04-02 DIAGNOSIS — Z87891 Personal history of nicotine dependence: Secondary | ICD-10-CM | POA: Diagnosis not present

## 2021-04-02 DIAGNOSIS — R109 Unspecified abdominal pain: Secondary | ICD-10-CM | POA: Insufficient documentation

## 2021-04-02 DIAGNOSIS — I10 Essential (primary) hypertension: Secondary | ICD-10-CM | POA: Diagnosis present

## 2021-04-02 DIAGNOSIS — E039 Hypothyroidism, unspecified: Secondary | ICD-10-CM | POA: Insufficient documentation

## 2021-04-02 DIAGNOSIS — I700 Atherosclerosis of aorta: Secondary | ICD-10-CM | POA: Diagnosis not present

## 2021-04-02 DIAGNOSIS — Z20822 Contact with and (suspected) exposure to covid-19: Secondary | ICD-10-CM | POA: Insufficient documentation

## 2021-04-02 DIAGNOSIS — Z79899 Other long term (current) drug therapy: Secondary | ICD-10-CM | POA: Diagnosis not present

## 2021-04-02 DIAGNOSIS — E119 Type 2 diabetes mellitus without complications: Secondary | ICD-10-CM | POA: Diagnosis not present

## 2021-04-02 DIAGNOSIS — Z7902 Long term (current) use of antithrombotics/antiplatelets: Secondary | ICD-10-CM | POA: Diagnosis not present

## 2021-04-02 DIAGNOSIS — J984 Other disorders of lung: Secondary | ICD-10-CM | POA: Diagnosis not present

## 2021-04-02 DIAGNOSIS — R52 Pain, unspecified: Secondary | ICD-10-CM | POA: Diagnosis not present

## 2021-04-02 DIAGNOSIS — T300 Burn of unspecified body region, unspecified degree: Secondary | ICD-10-CM | POA: Diagnosis not present

## 2021-04-02 DIAGNOSIS — R918 Other nonspecific abnormal finding of lung field: Secondary | ICD-10-CM | POA: Diagnosis not present

## 2021-04-02 DIAGNOSIS — N281 Cyst of kidney, acquired: Secondary | ICD-10-CM | POA: Diagnosis not present

## 2021-04-02 DIAGNOSIS — G4733 Obstructive sleep apnea (adult) (pediatric): Secondary | ICD-10-CM | POA: Diagnosis present

## 2021-04-02 DIAGNOSIS — M549 Dorsalgia, unspecified: Secondary | ICD-10-CM | POA: Diagnosis not present

## 2021-04-02 DIAGNOSIS — N400 Enlarged prostate without lower urinary tract symptoms: Secondary | ICD-10-CM | POA: Diagnosis not present

## 2021-04-02 LAB — CBC WITH DIFFERENTIAL/PLATELET
Abs Immature Granulocytes: 0.18 10*3/uL — ABNORMAL HIGH (ref 0.00–0.07)
Basophils Absolute: 0.1 10*3/uL (ref 0.0–0.1)
Basophils Relative: 0 %
Eosinophils Absolute: 0 10*3/uL (ref 0.0–0.5)
Eosinophils Relative: 0 %
HCT: 33.5 % — ABNORMAL LOW (ref 39.0–52.0)
Hemoglobin: 11.1 g/dL — ABNORMAL LOW (ref 13.0–17.0)
Immature Granulocytes: 1 %
Lymphocytes Relative: 3 %
Lymphs Abs: 0.6 10*3/uL — ABNORMAL LOW (ref 0.7–4.0)
MCH: 33.8 pg (ref 26.0–34.0)
MCHC: 33.1 g/dL (ref 30.0–36.0)
MCV: 102.1 fL — ABNORMAL HIGH (ref 80.0–100.0)
Monocytes Absolute: 0.8 10*3/uL (ref 0.1–1.0)
Monocytes Relative: 4 %
Neutro Abs: 17.9 10*3/uL — ABNORMAL HIGH (ref 1.7–7.7)
Neutrophils Relative %: 92 %
Platelets: 251 10*3/uL (ref 150–400)
RBC: 3.28 MIL/uL — ABNORMAL LOW (ref 4.22–5.81)
RDW: 15.1 % (ref 11.5–15.5)
WBC: 19.6 10*3/uL — ABNORMAL HIGH (ref 4.0–10.5)
nRBC: 0 % (ref 0.0–0.2)

## 2021-04-02 LAB — COMPREHENSIVE METABOLIC PANEL
ALT: 13 U/L (ref 0–44)
AST: 16 U/L (ref 15–41)
Albumin: 3.9 g/dL (ref 3.5–5.0)
Alkaline Phosphatase: 52 U/L (ref 38–126)
Anion gap: 7 (ref 5–15)
BUN: 13 mg/dL (ref 8–23)
CO2: 26 mmol/L (ref 22–32)
Calcium: 8.5 mg/dL — ABNORMAL LOW (ref 8.9–10.3)
Chloride: 103 mmol/L (ref 98–111)
Creatinine, Ser: 0.73 mg/dL (ref 0.61–1.24)
GFR, Estimated: >60 mL/min (ref >60)
Glucose, Bld: 117 mg/dL — ABNORMAL HIGH (ref 70–99)
Potassium: 3.7 mmol/L (ref 3.5–5.1)
Sodium: 136 mmol/L (ref 135–145)
Total Bilirubin: 0.8 mg/dL (ref 0.3–1.2)
Total Protein: 6.9 g/dL (ref 6.5–8.1)

## 2021-04-02 LAB — URINALYSIS, ROUTINE W REFLEX MICROSCOPIC
Bilirubin Urine: NEGATIVE
Glucose, UA: NEGATIVE mg/dL
Hgb urine dipstick: NEGATIVE
Ketones, ur: NEGATIVE mg/dL
Leukocytes,Ua: NEGATIVE
Nitrite: NEGATIVE
Protein, ur: NEGATIVE mg/dL
Specific Gravity, Urine: 1.013 (ref 1.005–1.030)
pH: 6 (ref 5.0–8.0)

## 2021-04-02 LAB — LACTIC ACID, PLASMA
Lactic Acid, Venous: 1.5 mmol/L (ref 0.5–1.9)
Lactic Acid, Venous: 2.4 mmol/L (ref 0.5–1.9)

## 2021-04-02 LAB — RESP PANEL BY RT-PCR (FLU A&B, COVID) ARPGX2
Influenza A by PCR: NEGATIVE
Influenza B by PCR: NEGATIVE
SARS Coronavirus 2 by RT PCR: NEGATIVE

## 2021-04-02 MED ORDER — ONDANSETRON HCL 4 MG/2ML IJ SOLN
4.0000 mg | Freq: Once | INTRAMUSCULAR | Status: AC
  Administered 2021-04-02: 4 mg via INTRAVENOUS
  Filled 2021-04-02: qty 2

## 2021-04-02 MED ORDER — SODIUM CHLORIDE 0.9 % IV SOLN
500.0000 mg | Freq: Once | INTRAVENOUS | Status: AC
  Administered 2021-04-02: 500 mg via INTRAVENOUS
  Filled 2021-04-02: qty 500

## 2021-04-02 MED ORDER — SODIUM CHLORIDE 0.9 % IV SOLN
1.0000 g | Freq: Once | INTRAVENOUS | Status: AC
  Administered 2021-04-02: 1 g via INTRAVENOUS
  Filled 2021-04-02: qty 10

## 2021-04-02 MED ORDER — LACTATED RINGERS IV BOLUS
1000.0000 mL | Freq: Once | INTRAVENOUS | Status: AC
  Administered 2021-04-02: 1000 mL via INTRAVENOUS

## 2021-04-02 MED ORDER — FENTANYL CITRATE (PF) 100 MCG/2ML IJ SOLN
50.0000 ug | Freq: Once | INTRAMUSCULAR | Status: AC
  Administered 2021-04-02: 50 ug via INTRAVENOUS
  Filled 2021-04-02: qty 2

## 2021-04-02 NOTE — ED Notes (Signed)
Date and time results received: 04/02/21 2048 (use smartphrase ".now" to insert current time)  Test: lactic Critical Value: 2.4  Name of Provider Notified: Dr. Denton Brick  Orders Received? Or Actions Taken?: no/na

## 2021-04-02 NOTE — ED Provider Notes (Signed)
[facility] EMERGENCY DEPARTMENT Provider Note   CSN: [id number] Arrival date & time: 04/02/21  1258     History Chief Complaint  Patient presents with   Back Pain    [name] is a 75 y.o. male.   Back Pain Associated symptoms: dysuria   Associated symptoms: no abdominal pain, no chest pain, no numbness and no weakness       [name] is a 75 y.o. male, with a history of CHF, DM, GERD, HTN, stroke, presenting to the ED with mid back pain on the left beginning earlier today. States he began to feel chills and overall feel poorly yesterday. Today he began to experience dysuria and intermittent left mid and lower back pain, sharp, severe, radiating into the left flank.  Accompanied by nausea.  Denies definite fever, vomiting, diarrhea, abdominal pain, chest pain, shortness of breath, lower extremity edema/pain, fall/trauma, or any other complaints.    Past Medical History:  Diagnosis Date   Aftercare following surgery of the circulatory system, NEC 12/04/2013   Angina decubitus ([place]) 05/19/2016   Arrhythmia 05/20/2016   Arthritis    ARTHRITIS, RIGHT FOOT 06/26/2008   Qualifier: Diagnosis of  By: Aline Brochure MD, [name]     Cardiomyopathy- EF NL 01/2013, now 30-35% echo 03/14/2012   Carotid artery occlusion    left s/p CEA   Cellulitis of left foot 05/07/2018   CHF (congestive heart failure) (HCC)    Critical lower limb ischemia ([place]) 02/28/2014   Critical limb ischemia    Diabetes mellitus    Elevated troponin 04/11/2016   GERD (gastroesophageal reflux disease)    History of stroke June 2013 03/14/2012   HOH (hard of hearing)    Hypertension    Hypothyroidism    Nonhealing skin ulcer ([place]) 10/28/2014   Obstructive sleep apnea    Orthostatic hypotension    Osteomyelitis (HCC)    left great toe   Pain in the chest 05/17/2016   Peripheral arterial disease ([place]), s/p PTA x 2 RLE    nonhealing ulcers bilaterally on each great toe   Pneumonia    PONV (postoperative  nausea and vomiting)    Pulmonary nodule 05/17/2016   Restless leg syndrome    Shortness of breath    Stroke [facility]) March 08, 2012   Toe osteomyelitis, left [facility])    Ulcer of great toe, left, with necrosis of bone ([place])    Wears dentures     Patient Active Problem List   Diagnosis Date Noted   Abnormal finding on urinalysis 04/01/2021   Syncope 02/16/2021   Minor head injury    Right hip pain 02/11/2021   Erectile dysfunction 01/22/2021   Gait abnormality 09/30/2020   Mild non proliferative diabetic retinopathy ([place]) 07/16/2020   Need for immunization against influenza 06/03/2020   S/P left knee arthroscopy 05/06/20 05/13/2020   Abnormal MRI 03/26/2020   Joint disorder of knee 03/26/2020   Fall at home, initial encounter 03/13/2020   Anemia 09/03/2019   Reflux esophagitis 09/03/2019   Esophageal dysphagia 05/30/2019   Constipation 05/30/2019   Positive colorectal cancer screening using Cologuard test 05/30/2019   Diabetic foot ulcer ([place]) 09/06/2018   Orthostasis 08/25/2016   Congestive heart failure ([place]) 05/17/2016   Bilateral carotid artery disease ([place]) 07/08/2014   Obstructive sleep apnea 02/12/2014   Restless legs 10/23/2013   Overweight with body mass index (BMI) of 29 to 29.9 in adult 10/23/2013   Hyperlipidemia 07/16/2013   Peripheral arterial disease ([place]) 07/16/2013  Hypertension 03/14/2012   Type 2 diabetes mellitus with circulatory disorder ([place]) 03/14/2012   Peripheral neuropathy 03/14/2012   Cardiomyopathy- EF NL 01/2013, now 30-35% echo 03/14/2012    Past Surgical History:  Procedure Laterality Date   AMPUTATION TOE Left 07/25/2018   Procedure: AMPUTATION TOE INTERPHALANGEAL HALLUX LEFT;  Surgeon: Evelina Bucy, DPM;  Location: [place];  Service: Podiatry;  Laterality: Left;   ANGIOPLASTY  02/28/14   diamond back orbital rotational atherectomy of Rt. tibial   BACK SURGERY     BONE BIOPSY Left 07/25/2018   Procedure: SUPERFICIAL BONE BIOPSY;  Surgeon:  Evelina Bucy, DPM;  Location: [place];  Service: Podiatry;  Laterality: Left;   CARDIAC CATHETERIZATION N/A 05/19/2016   Procedure: Right/Left Heart Cath and Coronary Angiography;  Surgeon: Belva Crome, MD;  Location: [place] CV LAB;  Service: Cardiovascular;  Laterality: N/A;   CERVICAL FUSION     ENDARTERECTOMY Left 11/29/2013   Procedure: ENDARTERECTOMY CAROTID;  Surgeon: Serafina Mitchell, MD;  Location: [place];  Service: Vascular;  Laterality: Left;   ESOPHAGOGASTRODUODENOSCOPY  02/2010   Dr. Gala Romney: patient presented with food impaction, schatzki ring with superimposed component of stricture with erosive reflux esophagitis, s/p disimpaction but dilation planned at later date    ESOPHAGOGASTRODUODENOSCOPY (EGD) WITH PROPOFOL N/A 08/13/2019   Dr. Gala Romney: Erosive reflux esophagitis with mild stricture and incidental Mallory-Weiss tear which precluded esophageal dilation.  Medium sized hiatal hernia.   ESOPHAGOGASTRODUODENOSCOPY (EGD) WITH PROPOFOL N/A 10/11/2019   Dr. Gala Romney: Esophageal stenosis status post dilation, moderate hiatal hernia   EYE SURGERY     FLEXIBLE SIGMOIDOSCOPY N/A 08/13/2019   Procedure: FLEXIBLE SIGMOIDOSCOPY;  Surgeon: [name], MD;  [facility];  Service: Endoscopy;  Laterality: N/A;  colonoscopy aboerted due to formed stool and poor prep   FOOT SURGERY     KNEE ARTHROSCOPY WITH MEDIAL MENISECTOMY Left 05/06/2020   Procedure: KNEE ARTHROSCOPY WITH MEDIAL MENISCECTOMY AND LATERAL MENISCECTOMY;  Surgeon: [name], MD;  Location: [facility];  Service: Orthopedics;  Laterality: Left;   Lower ext duplex doppler  03/14/14   Rt ABI 1.2   LOWER EXTREMITY ANGIOGRAM Bilateral 02/18/2014   Procedure: LOWER EXTREMITY ANGIOGRAM;  Surgeon: [name], MD;  Location: [facility] CATH LAB;  Service: Cardiovascular;  Laterality: Bilateral;   LOWER EXTREMITY ANGIOGRAM N/A 10/31/2014   Procedure: LOWER EXTREMITY ANGIOGRAM;  Surgeon: [name], MD;  Location: [facility]  CATH LAB;  Service: Cardiovascular;  Laterality: N/A;   MALONEY DILATION N/A 10/11/2019   Procedure: Venia Minks DILATION;  Surgeon: [name], MD;  [facility];  Service: Endoscopy;  Laterality: N/A;   MULTIPLE TOOTH EXTRACTIONS     PV angiogram  02/18/2014   tibial vessel diseas bil.   SPINE SURGERY     tendon achillies lengthing and sesamoid         Family History  Problem Relation Age of Onset   Heart disease Mother    Hypertension Mother    Heart attack Mother    Hypertension Father    Diabetes Father    Diabetes Son    Heart disease Son    Hypertension Son    Colon cancer Neg Hx     Social History   Tobacco Use   Smoking status: Former    Types: Pipe    Quit date: 07/08/1977    Years since quitting: 43.7   Smokeless tobacco: Never  Vaping Use   Vaping Use: Never used  Substance Use Topics   Alcohol use: No    Alcohol/week: 0.0 standard drinks   Drug use: No    Home Medications Prior to Admission medications   Medication Sig Start Date End Date Taking? Authorizing Provider  aspirin EC 81 MG EC tablet Take 1 tablet (81 mg total) by mouth daily. Patient taking differently: Take 81 mg by mouth at bedtime. 05/21/16  Yes Barrett, Evelene Croon, PA-C  b complex vitamins tablet Take 1 tablet by mouth daily.   Yes [provider]  carvedilol (COREG) 3.125 MG tablet Take 0.5 tablets (1.5625 mg total) by mouth 2 (two) times daily with a meal. 03/18/21  Yes Noreene Larsson, NP  Cholecalciferol (VITAMIN D3) 5000 units TABS Take 5,000 Units by mouth 2 (two) times daily.    Yes [provider]  clopidogrel (PLAVIX) 75 MG tablet TAKE ONE (1) TABLET BY MOUTH EVERY DAY 03/16/21  Yes Fayrene Helper, MD  Cyanocobalamin 1500 MCG TBDP Take 1,500 mcg by mouth daily.    Yes [provider]  DULoxetine (CYMBALTA) 60 MG capsule Take 1 capsule (60 mg total) by mouth daily. 09/09/20  Yes Fayrene Helper, MD  empagliflozin (JARDIANCE) 25 MG TABS  tablet Take 1 tablet (25 mg total) by mouth daily before breakfast. 09/09/20  Yes Fayrene Helper, MD  furosemide (LASIX) 40 MG tablet Take 1 tablet (40 mg total) by mouth daily. 02/19/21  Yes Johnson, Clanford L, MD  levothyroxine (SYNTHROID) 88 MCG tablet Take 1 tablet (88 mcg total) by mouth daily. 01/27/21  Yes Noreene Larsson, NP  linagliptin (TRADJENTA) 5 MG TABS tablet Take 1 tablet (5 mg total) by mouth daily. 02/17/21  Yes Johnson, Clanford L, MD  nitroGLYCERIN (NITROSTAT) 0.4 MG SL tablet Place 1 tablet (0.4 mg total) under the tongue every 5 (five) minutes as needed for chest pain. 05/22/16  Yes Barrett, Evelene Croon, PA-C  pantoprazole (PROTONIX) 40 MG tablet TAKE ONE (1) TABLET BY MOUTH EVERY DAY Patient taking differently: Take 40 mg by mouth daily. 04/10/20  Yes Jodi Mourning, Kristen S, PA-C  potassium chloride (KLOR-CON) 10 MEQ tablet TAKE ONE TABLET (10MEQ TOTAL) BY MOUTH DAILY Patient taking differently: Take 10 mEq by mouth once. 03/16/21  Yes [name], MD  pramipexole (MIRAPEX) 0.25 MG tablet Take 2 tablets (0.5 mg total) by mouth 2 (two) times daily. Take 0.5mg  by mouth at 7 PM and 0.5mg  at 9 PM daily. 02/25/20  Yes Ward Givens, NP  rosuvastatin (CRESTOR) 20 MG tablet Take 1 tablet (20 mg total) by mouth daily. 09/09/20  Yes Fayrene Helper, [facility] for CPAP 09/30/20  Yes Perlie Mayo, NP  Continuous Blood Gluc Receiver (FREESTYLE LIBRE 14 DAY READER) DEVI  12/07/19   [provider]  Continuous Blood Gluc Sensor (FREESTYLE LIBRE 14 DAY SENSOR) MISC Inject 1 each into the skin every 14 (fourteen) days. 01/22/21   Noreene Larsson, NP  Multiple Vitamin (MULTIVITAMIN WITH MINERALS) TABS tablet Take 1 tablet by mouth daily.    [provider]  UNABLE TO FIND [place] sock aid to help with support hose. Size XL.  Dx:I73.9, G25.81, R60.9 01/14/21   Noreene Larsson, NP    Allergies    Codeine and Tramadol  Review of Systems   Review of  Systems  Constitutional:  Positive for chills.  Respiratory:  Negative for cough and shortness of breath.   Cardiovascular:  Negative for chest pain and leg  swelling.  Gastrointestinal:  Positive for nausea. Negative for abdominal pain, diarrhea and vomiting.  Genitourinary:  Positive for dysuria and flank pain. Negative for difficulty urinating.  Musculoskeletal:  Positive for back pain.  Neurological:  Negative for dizziness, syncope, weakness and numbness.  All other systems reviewed and are negative.  Physical Exam Updated Vital Signs BP (!) 109/55 (BP Location: Right Arm)   Pulse (!) 101   Temp 98.9 F (37.2 C) (Oral)   Resp 18   SpO2 96%   Physical Exam Vitals and nursing note reviewed.  Constitutional:      General: He is not in acute distress.    Appearance: He is well-developed. He is not diaphoretic.  HENT:     Head: Normocephalic and atraumatic.     Mouth/Throat:     Mouth: Mucous membranes are moist.     Pharynx: Oropharynx is clear.  Eyes:     Conjunctiva/sclera: Conjunctivae normal.  Cardiovascular:     Rate and Rhythm: Normal rate and regular rhythm.     Pulses: Normal pulses.          Radial pulses are 2+ on the right side and 2+ on the left side.       Posterior tibial pulses are 2+ on the right side and 2+ on the left side.     Heart sounds: Normal heart sounds.  Pulmonary:     Effort: Pulmonary effort is normal. No respiratory distress.     Breath sounds: Normal breath sounds.  Abdominal:     Palpations: Abdomen is soft.     Tenderness: There is no abdominal tenderness. There is left CVA tenderness. There is no guarding.  Musculoskeletal:     Cervical back: Neck supple.       Back:     Right lower leg: No edema.     Left lower leg: No edema.     Comments: Area of the patient's pain in the left mid back as shown in the diagram.  Skin:    General: Skin is warm and dry.  Neurological:     Mental Status: He is alert.  Psychiatric:        Mood and  Affect: Mood and affect normal.        Speech: Speech normal.        Behavior: Behavior normal.    ED Results / Procedures / Treatments   Labs (all labs ordered are listed, but only abnormal results are displayed) Labs Reviewed  CBC WITH DIFFERENTIAL/PLATELET - Abnormal; Notable for the following components:      Result Value   WBC 19.6 (*)    RBC 3.28 (*)    Hemoglobin 11.1 (*)    HCT 33.5 (*)    MCV 102.1 (*)    Neutro Abs 17.9 (*)    Lymphs Abs 0.6 (*)    Abs Immature Granulocytes 0.18 (*)    All other components within normal limits  COMPREHENSIVE METABOLIC PANEL - Abnormal; Notable for the following components:   Glucose, Bld 117 (*)    Calcium 8.5 (*)    All other components within normal limits  CULTURE, BLOOD (ROUTINE X 2)  CULTURE, BLOOD (ROUTINE X 2)  RESP PANEL BY RT-PCR (FLU A&B, COVID) ARPGX2  URINE CULTURE  URINALYSIS, ROUTINE W REFLEX MICROSCOPIC  LACTIC ACID, PLASMA  LACTIC ACID, PLASMA   WBC  Date Value Ref Range Status  04/02/2021 19.6 (H) 4.0 - 10.5 K/uL Final  03/18/2021 5.3 3.4 - 10.8 x10E3/uL Final  02/17/2021 8.4 4.0 - 10.5 K/uL Final  02/15/2021 8.7 4.0 - 10.5 K/uL Final  01/26/2021 4.4 3.4 - 10.8 x10E3/uL Final  09/25/2020 5.1 3.4 - 10.8 x10E3/uL Final  05/01/2020 6.4 4.0 - 10.5 K/uL Final   Hemoglobin  Date Value Ref Range Status  04/02/2021 11.1 (L) 13.0 - 17.0 g/dL Final  03/18/2021 11.1 (L) 13.0 - 17.7 g/dL Final  02/17/2021 10.1 (L) 13.0 - 17.0 g/dL Final  02/15/2021 10.6 (L) 13.0 - 17.0 g/dL Final  01/26/2021 11.4 (L) 13.0 - 17.7 g/dL Final  09/25/2020 11.3 (L) 13.0 - 17.7 g/dL Final  05/06/2020 11.9 (L) 13.0 - 17.0 g/dL Final  02/15/2017 11.9 (L) 13.0 - 17.7 g/dL Final   EKG None  Radiology CT RENAL STONE STUDY  Result Date: 04/02/2021 CLINICAL DATA:  Flank pain, kidney stone suspected. EXAM: CT ABDOMEN AND PELVIS WITHOUT CONTRAST TECHNIQUE: Multidetector CT imaging of the abdomen and pelvis was performed following the  standard protocol without IV contrast. COMPARISON:  Chest CT May 17, 2016. FINDINGS: Lower chest: Ground-glass opacities in the dependent left lower lobe and lingula with a more consolidative area in the lingula along the major fissure. Patchy ground-glass opacities with interlobular septal thickening involving the right middle lobe and right lower lobe. Ground-glass 7 mm pulmonary nodule in the right middle lobe on image 16/4. Solid 7 mm pulmonary nodule in the right lower lobe. Additional bilateral 4 mm smaller pulmonary nodules. Normal size heart. Pericardial calcifications, unchanged from prior chest CT May 17, 2016, without pericardial effusion. Hepatobiliary: Unremarkable noncontrast appearance of the hepatic parenchyma. Gallbladder is mildly distended without evidence of acute inflammation, commonly seen in a fasting state. No biliary ductal dilation. Pancreas: Within normal limits. Spleen: Within normal limits. Adrenals/Urinary Tract: Thickening of the bilateral adrenal glands without discrete nodularity, favor hyperplasia. No hydronephrosis. No renal, ureteral or bladder calculi visualized. Hypodense 2.6 cm left interpolar renal cyst. Trabecular appearance of the urinary bladder wall. Stomach/Bowel: Stomach is decompressed limiting evaluation. No pathologic dilation of small bowel. The appendix is not definitely visualized however there is no pericecal inflammation. Terminal ileum appears within normal limits. Moderate volume of formed stool throughout the colon. Left-sided colonic diverticulosis without findings of acute diverticulitis. Vascular/Lymphatic: Aortic and branch vessel atherosclerosis without aneurysmal dilation. No pathologically enlarged abdominal or pelvic lymph nodes. Reproductive: Dystrophic calcifications in the enlarged prostate. Other: No abdominopelvic ascites. Musculoskeletal: L4-L5 posterior spinal fusion hardware without evidence of complication. Multilevel degenerative  changes spine with vacuum disc artifact and Modic type endplate changes at D3-U2. Degenerative changes bilateral hips and SI joints. No acute osseous abnormality. IMPRESSION: 1. No hydronephrosis. No renal, ureteral or bladder calculi visualized. 2. Ground-glass opacities in the dependent left lower lobe and lingula with a more consolidative area in the lingula and patchy ground-glass opacities with interlobular septal thickening involving the right middle lobe and right lower lobe. Findings which are likely infectious or inflammatory. 3. Multiple pulmonary nodules measuring up to 7 mm. Non-contrast chest CT at 3-6 months is recommended. If the nodules are stable at time of repeat CT, then future CT at 18-24 months (from today's scan) is considered optional for low-risk patients, but is recommended for high-risk patients. This recommendation follows the consensus statement: Guidelines for Management of Incidental Pulmonary Nodules Detected on CT Images: From the Fleischner Society 2017; Radiology 2017; 284:228-243. 4. Left-sided colonic diverticulosis without findings of acute diverticulitis. 5. Moderate volume of formed stool throughout the colon. 6. Trabecular appearance of the urinary bladder wall, likely sequela of  chronic outflow impedance. 7.  Aortic Atherosclerosis (ICD10-I70.0). Electronically Signed   By: Dahlia Bailiff MD   On: 04/02/2021 17:29    Procedures Procedures   Medications Ordered in ED Medications  azithromycin (ZITHROMAX) 500 mg in sodium chloride 0.9 % 250 mL IVPB (500 mg Intravenous New Bag/Given 04/02/21 1841)  fentaNYL (SUBLIMAZE) injection 50 mcg (has no administration in time range)  ondansetron (ZOFRAN) injection 4 mg (4 mg Intravenous Given 04/02/21 1600)  lactated ringers bolus 1,000 mL (0 mLs Intravenous Stopped 04/02/21 1850)  cefTRIAXone (ROCEPHIN) 1 g in sodium chloride 0.9 % 100 mL IVPB (0 g Intravenous Stopped 04/02/21 1849)    ED Course  I have reviewed the triage  vital signs and the nursing notes.  Pertinent labs & imaging results that were available during my care of the patient were reviewed by me and considered in my medical decision making (see chart for details).    MDM Rules/Calculators/A&P                          Patient presents with left mid back pain beginning this morning.  Chills and subjective fever beginning yesterday. Incidence of 1 recorded blood pressure under 881 systolic, but overall stable.  I personally reviewed and interpreted the patient's labs and imaging studies. Patient's urine was unremarkable. Significant leukocytosis present. CT with opacities in the area of the patient's pain.  Findings and plan of care discussed with attending physician, Welford Roche, MD.  Following the end of my shift, Dr. Almyra Free will admit patient.   Vitals:   04/02/21 1306 04/02/21 1721 04/02/21 1831  BP: (!) 109/55 96/63 130/78  Pulse: (!) 101 84 90  Resp: 18 18 18   Temp: 98.9 F (37.2 C) 99.7 F (37.6 C)   TempSrc: Oral Rectal   SpO2: 96% 95% 96%     Final Clinical Impression(s) / ED Diagnoses Final diagnoses:  Community acquired pneumonia of left lower lobe of lung    Rx / DC Orders ED Discharge Orders     None        Layla Maw 04/02/21 1943    Luna Fuse, MD 04/10/21 1124

## 2021-04-02 NOTE — Telephone Encounter (Signed)
Good call.

## 2021-04-02 NOTE — Telephone Encounter (Signed)
Pt called said his left side kidney is burning but it is hurting him so bad he cannot get comfortable he is freezing doesn't have a way to check temp advised pt that he would need to go to ER as provider is out of office today and no work in appts with verbal understanding

## 2021-04-02 NOTE — ED Triage Notes (Signed)
States he is concerned it is problems with his kidneys

## 2021-04-02 NOTE — H&P (Signed)
History and Physical    ESGAR BARNICK IDP:[id number] DOB: 09/13/1946 DOA: 04/02/2021  PCP: Noreene Larsson, NP   Patient coming from: Home  I have personally briefly reviewed patient's old medical records in [place]  Chief Complaint: Left back pain, cough.  HPI: [name] is a 75 y.o. male with medical history significant for cardiomyopathy, hypertension, diabetes mellitus, obstructive sleep apnea. Patient presented to the ED with complaints of left back pain and cough that started yesterday.  He reports dry cough.  No chest pain.  No leg swelling.  No vomiting or loose stools.  He has no chills.  Patient initially thought symptoms were related to his kidney.  He denies difficulty swallowing  ED Course: T-max 99.7, heart rate 80s, respiratory rate 18.  Blood pressure from 96-129.  O2 sats greater than 94% on room air.  WBC 19.6.  Chest x-ray shows left mid to lower lung field density which may represent pneumonia or aspiration.  Renal stone study no renal calculi or hydronephrosis.  Shows opacities/ consolidation left lower lobe, lingula right middle lobe and right lower lobe-infectious or inflammatory.  Also multiple pulmonary nodules.  lactic acid 1.5 > 2.4.  IV ceftriaxone as azithromycin started.  Review of Systems: As per HPI all other systems reviewed and negative.  Past Medical History:  Diagnosis Date   Aftercare following surgery of the circulatory system, NEC 12/04/2013   Angina decubitus ([place]) 05/19/2016   Arrhythmia 05/20/2016   Arthritis    ARTHRITIS, RIGHT FOOT 06/26/2008   Qualifier: Diagnosis of  By: Aline Brochure MD, [name]     Cardiomyopathy- EF NL 01/2013, now 30-35% echo 03/14/2012   Carotid artery occlusion    left s/p CEA   Cellulitis of left foot 05/07/2018   CHF (congestive heart failure) (HCC)    Critical lower limb ischemia ([place]) 02/28/2014   Critical limb ischemia    Diabetes mellitus    Elevated troponin 04/11/2016   GERD (gastroesophageal reflux  disease)    History of stroke June 2013 03/14/2012   HOH (hard of hearing)    Hypertension    Hypothyroidism    Nonhealing skin ulcer ([place]) 10/28/2014   Obstructive sleep apnea    Orthostatic hypotension    Osteomyelitis (HCC)    left great toe   Pain in the chest 05/17/2016   Peripheral arterial disease ([place]), s/p PTA x 2 RLE    nonhealing ulcers bilaterally on each great toe   Pneumonia    PONV (postoperative nausea and vomiting)    Pulmonary nodule 05/17/2016   Restless leg syndrome    Shortness of breath    Stroke [facility]) March 08, 2012   Toe osteomyelitis, left [facility])    Ulcer of great toe, left, with necrosis of bone ([place])    Wears dentures     Past Surgical History:  Procedure Laterality Date   AMPUTATION TOE Left 07/25/2018   Procedure: AMPUTATION TOE INTERPHALANGEAL HALLUX LEFT;  Surgeon: Evelina Bucy, DPM;  Location: [facility];  Service: Podiatry;  Laterality: Left;   ANGIOPLASTY  02/28/14   diamond back orbital rotational atherectomy of Rt. tibial   BACK SURGERY     BONE BIOPSY Left 07/25/2018   Procedure: SUPERFICIAL BONE BIOPSY;  Surgeon: Evelina Bucy, DPM;  Location: [facility];  Service: Podiatry;  Laterality: Left;   CARDIAC CATHETERIZATION N/A 05/19/2016   Procedure: Right/Left Heart Cath and Coronary Angiography;  Surgeon: Belva Crome, MD;  Location: [place] CV LAB;  Service: Cardiovascular;  Laterality: N/A;   CERVICAL FUSION     ENDARTERECTOMY Left 11/29/2013   Procedure: ENDARTERECTOMY CAROTID;  Surgeon: Serafina Mitchell, MD;  Location: [facility];  Service: Vascular;  Laterality: Left;   ESOPHAGOGASTRODUODENOSCOPY  02/2010   Dr. Gala Romney: patient presented with food impaction, schatzki ring with superimposed component of stricture with erosive reflux esophagitis, s/p disimpaction but dilation planned at later date    ESOPHAGOGASTRODUODENOSCOPY (EGD) WITH PROPOFOL N/A 08/13/2019   Dr. Gala Romney: Erosive reflux esophagitis with mild stricture and incidental Mallory-Weiss tear  which precluded esophageal dilation.  Medium sized hiatal hernia.   ESOPHAGOGASTRODUODENOSCOPY (EGD) WITH PROPOFOL N/A 10/11/2019   Dr. Gala Romney: Esophageal stenosis status post dilation, moderate hiatal hernia   EYE SURGERY     FLEXIBLE SIGMOIDOSCOPY N/A 08/13/2019   Procedure: FLEXIBLE SIGMOIDOSCOPY;  Surgeon: [name], MD;  [facility];  Service: Endoscopy;  Laterality: N/A;  colonoscopy aboerted due to formed stool and poor prep   FOOT SURGERY     KNEE ARTHROSCOPY WITH MEDIAL MENISECTOMY Left 05/06/2020   Procedure: KNEE ARTHROSCOPY WITH MEDIAL MENISCECTOMY AND LATERAL MENISCECTOMY;  Surgeon: [name], MD;  Location: [facility];  Service: Orthopedics;  Laterality: Left;   Lower ext duplex doppler  03/14/14   Rt ABI 1.2   LOWER EXTREMITY ANGIOGRAM Bilateral 02/18/2014   Procedure: LOWER EXTREMITY ANGIOGRAM;  Surgeon: [name], MD;  Location: [facility] CATH LAB;  Service: Cardiovascular;  Laterality: Bilateral;   LOWER EXTREMITY ANGIOGRAM N/A 10/31/2014   Procedure: LOWER EXTREMITY ANGIOGRAM;  Surgeon: [name], MD;  Location: [facility] CATH LAB;  Service: Cardiovascular;  Laterality: N/A;   MALONEY DILATION N/A 10/11/2019   Procedure: Venia Minks DILATION;  Surgeon: [name], MD;  [facility];  Service: Endoscopy;  Laterality: N/A;   MULTIPLE TOOTH EXTRACTIONS     PV angiogram  02/18/2014   tibial vessel diseas bil.   SPINE SURGERY     tendon achillies lengthing and sesamoid       reports that he quit smoking about 43 years ago. His smoking use included pipe. He has never used smokeless tobacco. He reports that he does not drink alcohol and does not use drugs.  Allergies  Allergen Reactions   Codeine Nausea And Vomiting   Tramadol Nausea Only    Family History  Problem Relation Age of Onset   Heart disease Mother    Hypertension Mother    Heart attack Mother    Hypertension Father    Diabetes Father    Diabetes Son    Heart disease Son     Hypertension Son    Colon cancer Neg Hx    Prior to Admission medications   Medication Sig Start Date End Date Taking? Authorizing Provider  aspirin EC 81 MG EC tablet Take 1 tablet (81 mg total) by mouth daily. Patient taking differently: Take 81 mg by mouth at bedtime. 05/21/16  Yes Barrett, Evelene Croon, PA-C  b complex vitamins tablet Take 1 tablet by mouth daily.   Yes [provider]  carvedilol (COREG) 3.125 MG tablet Take 0.5 tablets (1.5625 mg total) by mouth 2 (two) times daily with a meal. 03/18/21  Yes Noreene Larsson, NP  Cholecalciferol (VITAMIN D3) 5000 units TABS Take 5,000 Units by mouth 2 (two) times daily.    Yes [provider]  clopidogrel (PLAVIX) 75 MG tablet TAKE ONE (1) TABLET BY MOUTH EVERY DAY 03/16/21  Yes Fayrene Helper, MD  Cyanocobalamin  1500 MCG TBDP Take 1,500 mcg by mouth daily.    Yes [provider]  DULoxetine (CYMBALTA) 60 MG capsule Take 1 capsule (60 mg total) by mouth daily. 09/09/20  Yes Fayrene Helper, MD  empagliflozin (JARDIANCE) 25 MG TABS tablet Take 1 tablet (25 mg total) by mouth daily before breakfast. 09/09/20  Yes Fayrene Helper, MD  furosemide (LASIX) 40 MG tablet Take 1 tablet (40 mg total) by mouth daily. 02/19/21  Yes Johnson, Clanford L, MD  levothyroxine (SYNTHROID) 88 MCG tablet Take 1 tablet (88 mcg total) by mouth daily. 01/27/21  Yes Noreene Larsson, NP  linagliptin (TRADJENTA) 5 MG TABS tablet Take 1 tablet (5 mg total) by mouth daily. 02/17/21  Yes Johnson, Clanford L, MD  nitroGLYCERIN (NITROSTAT) 0.4 MG SL tablet Place 1 tablet (0.4 mg total) under the tongue every 5 (five) minutes as needed for chest pain. 05/22/16  Yes Barrett, Evelene Croon, PA-C  pantoprazole (PROTONIX) 40 MG tablet TAKE ONE (1) TABLET BY MOUTH EVERY DAY Patient taking differently: Take 40 mg by mouth daily. 04/10/20  Yes Jodi Mourning, Kristen S, PA-C  potassium chloride (KLOR-CON) 10 MEQ tablet TAKE ONE TABLET (10MEQ TOTAL) BY MOUTH  DAILY Patient taking differently: Take 10 mEq by mouth once. 03/16/21  Yes [name], MD  pramipexole (MIRAPEX) 0.25 MG tablet Take 2 tablets (0.5 mg total) by mouth 2 (two) times daily. Take 0.5mg  by mouth at 7 PM and 0.5mg  at 9 PM daily. 02/25/20  Yes Ward Givens, NP  rosuvastatin (CRESTOR) 20 MG tablet Take 1 tablet (20 mg total) by mouth daily. 09/09/20  Yes Fayrene Helper, [place] [facility] for CPAP 09/30/20  Yes Perlie Mayo, NP  Continuous Blood Gluc Receiver (FREESTYLE LIBRE 14 DAY READER) DEVI  12/07/19   [provider]  Continuous Blood Gluc Sensor (FREESTYLE LIBRE 14 DAY SENSOR) MISC Inject 1 each into the skin every 14 (fourteen) days. 01/22/21   Noreene Larsson, NP  Multiple Vitamin (MULTIVITAMIN WITH MINERALS) TABS tablet Take 1 tablet by mouth daily.    [provider]  UNABLE TO FIND [place] sock aid to help with support hose. Size XL.  Dx:I73.9, G25.81, R60.9 01/14/21   Noreene Larsson, NP    Physical Exam: Vitals:   04/02/21 1306 04/02/21 1721 04/02/21 1831 04/02/21 1900  BP: (!) 109/55 96/63 130/78 (!) 129/48  Pulse: (!) 101 84 90 89  Resp: 18 18 18    Temp: 98.9 F (37.2 C) 99.7 F (37.6 C)    TempSrc: Oral Rectal    SpO2: 96% 95% 96%     Constitutional: Sleeping but easily arousable, comfortable Vitals:   04/02/21 1306 04/02/21 1721 04/02/21 1831 04/02/21 1900  BP: (!) 109/55 96/63 130/78 (!) 129/48  Pulse: (!) 101 84 90 89  Resp: 18 18 18    Temp: 98.9 F (37.2 C) 99.7 F (37.6 C)    TempSrc: Oral Rectal    SpO2: 96% 95% 96%    Eyes: PERRL, lids and conjunctivae normal ENMT: Mucous membranes are moist.   Neck: normal, supple, no masses, no thyromegaly Respiratory: clear to auscultation bilaterally, no wheezing, no crackles. Normal respiratory effort. No accessory muscle use.  Cardiovascular: Regular rate and rhythm, no murmurs / rubs / gallops. No extremity edema. 2+ pedal pulses.  Abdomen: no tenderness,  no masses palpated. No hepatosplenomegaly. Bowel sounds positive.  Musculoskeletal: no clubbing / cyanosis. No joint deformity upper and lower extremities. Good ROM,  no contractures. Normal muscle tone.  Skin: no rashes, lesions, ulcers. No induration Neurologic:  No apparent cranial abnormality, moving extremities spontaneously Psychiatric: Normal judgment and insight. Alert and oriented x 3. Normal mood.   Labs on Admission: I have personally reviewed following labs and imaging studies  CBC: Recent Labs  Lab 04/02/21 1523  WBC 19.6*  NEUTROABS 17.9*  HGB 11.1*  HCT 33.5*  MCV 102.1*  PLT 161   Basic Metabolic Panel: Recent Labs  Lab 04/02/21 1523  NA 136  K 3.7  CL 103  CO2 26  GLUCOSE 117*  BUN 13  CREATININE 0.73  CALCIUM 8.5*   GFR: CrCl cannot be calculated (Unknown ideal weight.). Liver Function Tests: Recent Labs  Lab 04/02/21 1523  AST 16  ALT 13  ALKPHOS 52  BILITOT 0.8  PROT 6.9  ALBUMIN 3.9   Urine analysis:    Component Value Date/Time   COLORURINE YELLOW 04/02/2021 1452   APPEARANCEUR CLEAR 04/02/2021 1452   LABSPEC 1.013 04/02/2021 1452   PHURINE 6.0 04/02/2021 1452   GLUCOSEU NEGATIVE 04/02/2021 1452   HGBUR NEGATIVE 04/02/2021 1452   BILIRUBINUR NEGATIVE 04/02/2021 1452   KETONESUR NEGATIVE 04/02/2021 1452   PROTEINUR NEGATIVE 04/02/2021 1452   UROBILINOGEN 0.2 11/23/2013 1530   NITRITE NEGATIVE 04/02/2021 1452   LEUKOCYTESUR NEGATIVE 04/02/2021 1452    Radiological Exams on Admission: DG Chest Portable 1 View  Result Date: 04/02/2021 CLINICAL DATA:  75 year old male with lung opacity noted on the recent CT abdomen pelvis with EXAM: PORTABLE CHEST 1 VIEW COMPARISON:  CT abdomen pelvis dated 04/02/2021 and chest radiograph dated 02/15/2021. FINDINGS: Left mid to lower lung field nodular and hazy density may represent atypical pneumonia or aspiration. Clinical correlation recommended. No focal consolidation, pleural effusion, or  pneumothorax. The cardiac silhouette is within limits. Atherosclerotic calcification of the aorta. Osteopenia with degenerative changes of the spine. No acute osseous pathology. IMPRESSION: Left mid to lower lung field nodular and hazy density may represent atypical pneumonia or aspiration. Electronically Signed   By: Anner Crete M.D.   On: 04/02/2021 19:43   CT RENAL STONE STUDY  Result Date: 04/02/2021 CLINICAL DATA:  Flank pain, kidney stone suspected. EXAM: CT ABDOMEN AND PELVIS WITHOUT CONTRAST TECHNIQUE: Multidetector CT imaging of the abdomen and pelvis was performed following the standard protocol without IV contrast. COMPARISON:  Chest CT May 17, 2016. FINDINGS: Lower chest: Ground-glass opacities in the dependent left lower lobe and lingula with a more consolidative area in the lingula along the major fissure. Patchy ground-glass opacities with interlobular septal thickening involving the right middle lobe and right lower lobe. Ground-glass 7 mm pulmonary nodule in the right middle lobe on image 16/4. Solid 7 mm pulmonary nodule in the right lower lobe. Additional bilateral 4 mm smaller pulmonary nodules. Normal size heart. Pericardial calcifications, unchanged from prior chest CT May 17, 2016, without pericardial effusion. Hepatobiliary: Unremarkable noncontrast appearance of the hepatic parenchyma. Gallbladder is mildly distended without evidence of acute inflammation, commonly seen in a fasting state. No biliary ductal dilation. Pancreas: Within normal limits. Spleen: Within normal limits. Adrenals/Urinary Tract: Thickening of the bilateral adrenal glands without discrete nodularity, favor hyperplasia. No hydronephrosis. No renal, ureteral or bladder calculi visualized. Hypodense 2.6 cm left interpolar renal cyst. Trabecular appearance of the urinary bladder wall. Stomach/Bowel: Stomach is decompressed limiting evaluation. No pathologic dilation of small bowel. The appendix is not  definitely visualized however there is no pericecal inflammation. Terminal ileum appears within normal limits. Moderate volume of  formed stool throughout the colon. Left-sided colonic diverticulosis without findings of acute diverticulitis. Vascular/Lymphatic: Aortic and branch vessel atherosclerosis without aneurysmal dilation. No pathologically enlarged abdominal or pelvic lymph nodes. Reproductive: Dystrophic calcifications in the enlarged prostate. Other: No abdominopelvic ascites. Musculoskeletal: L4-L5 posterior spinal fusion hardware without evidence of complication. Multilevel degenerative changes spine with vacuum disc artifact and Modic type endplate changes at M3-T5. Degenerative changes bilateral hips and SI joints. No acute osseous abnormality. IMPRESSION: 1. No hydronephrosis. No renal, ureteral or bladder calculi visualized. 2. Ground-glass opacities in the dependent left lower lobe and lingula with a more consolidative area in the lingula and patchy ground-glass opacities with interlobular septal thickening involving the right middle lobe and right lower lobe. Findings which are likely infectious or inflammatory. 3. Multiple pulmonary nodules measuring up to 7 mm. Non-contrast chest CT at 3-6 months is recommended. If the nodules are stable at time of repeat CT, then future CT at 18-24 months (from today's scan) is considered optional for low-risk patients, but is recommended for high-risk patients. This recommendation follows the consensus statement: Guidelines for Management of Incidental Pulmonary Nodules Detected on CT Images: From the Fleischner Society 2017; Radiology 2017; 284:228-243. 4. Left-sided colonic diverticulosis without findings of acute diverticulitis. 5. Moderate volume of formed stool throughout the colon. 6. Trabecular appearance of the urinary bladder wall, likely sequela of chronic outflow impedance. 7.  Aortic Atherosclerosis (ICD10-I70.0). Electronically Signed   By: Dahlia Bailiff MD   On: 04/02/2021 17:29    EKG: None   Assessment/Plan Principal Problem:   PNA (pneumonia) Active Problems:   Type 2 diabetes mellitus with circulatory disorder (HCC)   Obstructive sleep apnea   Congestive heart failure (HCC)   Pneumonia-T-max 99.7, heart rate 80s, respiratory rate 99 stable blood pressure, leukocytosis of 19.6.  Rules out for sepsis, but has a lactic acid of 2.4.  COVID test negative.  Chest CT with opacity/consolidation involving both sides of the lung.  Patient denies dysphagia currently, per chart he has a history of esophageal dysphagia/stenosis which required esophageal dilation in 2021 by Dr. Gala Romney. -Continue IV ceftriaxone and azithromycin -Obtain EKG, check QTC - Mucolytic's as needed -  1.5 L bolus cont N/s + 20 kcl 75cc/hr x 12hrs - trend lactic acid   Uncontrolled diabetes mellitus-random glucose 117.  A1c 9.6 - SSI- S -Hold linagliptin, Jardiance  Hypertension-systolic 97-4 26. -Resume carvedilol, -Hold home Lasix 40 mg daily for now with lactic acidosis  History of systolic congestive heart failure-stable and compensated.  Echo from 2017 showed EF of 30 to 35%, but last echo 01/2021-showed improved EF of 55 to 60% with G1DD. -Lasix for now with lactic acidosis  History of stroke, PAD- stable. -Resume Plavix  DVT prophylaxis: Lovenox Code Status: Full code Family Communication: None at bedside Disposition Plan: ~ 2 days Consults called:  None Admission status: Inpt  med surg I certify that at the point of admission it is my clinical judgment that the patient will require inpatient hospital care spanning beyond 2 midnights from the point of admission due to high intensity of service, high risk for further deterioration and high frequency of surveillance required.    Bethena Roys MD [facility]  04/02/2021, 12:23 AM

## 2021-04-02 NOTE — ED Notes (Signed)
Pt given meal tray.

## 2021-04-02 NOTE — ED Triage Notes (Signed)
States he has left sided back pain onset last night. Informed registration staff he is going to fall in the floor

## 2021-04-03 DIAGNOSIS — J189 Pneumonia, unspecified organism: Secondary | ICD-10-CM | POA: Diagnosis not present

## 2021-04-03 LAB — BASIC METABOLIC PANEL
Anion gap: 6 (ref 5–15)
BUN: 12 mg/dL (ref 8–23)
CO2: 27 mmol/L (ref 22–32)
Calcium: 8.3 mg/dL — ABNORMAL LOW (ref 8.9–10.3)
Chloride: 104 mmol/L (ref 98–111)
Creatinine, Ser: 0.62 mg/dL (ref 0.61–1.24)
GFR, Estimated: >60 mL/min (ref >60)
Glucose, Bld: 95 mg/dL (ref 70–99)
Potassium: 4 mmol/L (ref 3.5–5.1)
Sodium: 137 mmol/L (ref 135–145)

## 2021-04-03 LAB — CBC
HCT: 30.3 % — ABNORMAL LOW (ref 39.0–52.0)
Hemoglobin: 9.8 g/dL — ABNORMAL LOW (ref 13.0–17.0)
MCH: 33.3 pg (ref 26.0–34.0)
MCHC: 32.3 g/dL (ref 30.0–36.0)
MCV: 103.1 fL — ABNORMAL HIGH (ref 80.0–100.0)
Platelets: 218 10*3/uL (ref 150–400)
RBC: 2.94 MIL/uL — ABNORMAL LOW (ref 4.22–5.81)
RDW: 14.9 % (ref 11.5–15.5)
WBC: 10.9 10*3/uL — ABNORMAL HIGH (ref 4.0–10.5)
nRBC: 0 % (ref 0.0–0.2)

## 2021-04-03 LAB — LACTIC ACID, PLASMA: Lactic Acid, Venous: 0.6 mmol/L (ref 0.5–1.9)

## 2021-04-03 MED ORDER — GUAIFENESIN-DM 100-10 MG/5ML PO SYRP
10.0000 mL | ORAL_SOLUTION | ORAL | 0 refills | Status: DC | PRN
Start: 2021-04-03 — End: 2021-04-23

## 2021-04-03 MED ORDER — GUAIFENESIN-DM 100-10 MG/5ML PO SYRP: 10.0000 mL | ORAL_SOLUTION | ORAL | Status: DC | PRN

## 2021-04-03 MED ORDER — ENOXAPARIN SODIUM 40 MG/0.4ML IJ SOSY
40.0000 mg | PREFILLED_SYRINGE | INTRAMUSCULAR | Status: DC
  Administered 2021-04-03: 40 mg via SUBCUTANEOUS
  Filled 2021-04-03: qty 0.4

## 2021-04-03 MED ORDER — POTASSIUM CHLORIDE IN NACL 20-0.9 MEQ/L-% IV SOLN: INTRAVENOUS | Status: AC

## 2021-04-03 MED ORDER — SODIUM CHLORIDE 0.9 % IV SOLN: 500.0000 mg | INTRAVENOUS | Status: DC

## 2021-04-03 MED ORDER — ONDANSETRON HCL 4 MG/2ML IJ SOLN: 4.0000 mg | Freq: Four times a day (QID) | INTRAMUSCULAR | Status: DC | PRN

## 2021-04-03 MED ORDER — ACETAMINOPHEN 650 MG RE SUPP
650.0000 mg | Freq: Four times a day (QID) | RECTAL | Status: DC | PRN
Start: 2021-04-03 — End: 2021-04-03

## 2021-04-03 MED ORDER — POLYETHYLENE GLYCOL 3350 17 G PO PACK: 17.0000 g | PACK | Freq: Every day | ORAL | Status: DC | PRN

## 2021-04-03 MED ORDER — ACETAMINOPHEN 325 MG PO TABS
650.0000 mg | ORAL_TABLET | Freq: Four times a day (QID) | ORAL | Status: DC | PRN
  Administered 2021-04-03: 650 mg via ORAL
  Filled 2021-04-03: qty 2

## 2021-04-03 MED ORDER — ONDANSETRON HCL 4 MG PO TABS: 4.0000 mg | ORAL_TABLET | Freq: Four times a day (QID) | ORAL | Status: DC | PRN

## 2021-04-03 MED ORDER — SODIUM CHLORIDE 0.9 % IV BOLUS
500.0000 mL | Freq: Once | INTRAVENOUS | Status: AC
  Administered 2021-04-03: 500 mL via INTRAVENOUS

## 2021-04-03 MED ORDER — AMOXICILLIN-POT CLAVULANATE 875-125 MG PO TABS: 1.0000 | ORAL_TABLET | Freq: Two times a day (BID) | ORAL | 0 refills | Status: AC

## 2021-04-03 MED ORDER — SODIUM CHLORIDE 0.9 % IV SOLN: 1.0000 g | INTRAVENOUS | Status: DC

## 2021-04-03 NOTE — Evaluation (Signed)
Physical Therapy Evaluation Patient Details Name: [name] MRN: [id number] DOB: 17-Mar-1946 Today's Date: 04/03/2021   History of Present Illness  [name] is a 75 y.o. male with medical history significant for cardiomyopathy, hypertension, diabetes mellitus, obstructive sleep apnea.  Patient presented to the ED with complaints of left back pain and cough that started yesterday.  He reports dry cough.  No chest pain.  No leg swelling.  No vomiting or loose stools.  He has no chills.  Patient initially thought symptoms were related to his kidney.  He denies difficulty swallowing   Clinical Impression  Patient seated EOB at beginning of session with wife present. Patient requires min assist to transfer to standing with RW secondary to bilateral LE weakness and impaired balance. Patient O2 sat monitored throughout session: at rest on room air: 98%, with ambulation on RA 96-97%. Patient ambulates with crouched gait with heavy UE support and slightly unsteady cadence. Patient educated on  existing outpatient PT evaluation for Monday morning. Patient discharged to care of nursing for ambulation daily as tolerated for length of stay.     Follow Up Recommendations Outpatient PT    Equipment Recommendations  None recommended by PT    Recommendations for Other Services       Precautions / Restrictions Precautions Precautions: Fall Restrictions Weight Bearing Restrictions: No      Mobility  Bed Mobility               General bed mobility comments: seated EOB at beginning of session    Transfers Overall transfer level: Needs assistance Equipment used: Rolling walker (2 wheeled) Transfers: Sit to/from [facility] Sit to Stand: Min assist Stand pivot transfers: Min guard       General transfer comment: labored transition to standing with RW, assist for LE weakness  Ambulation/Gait Ambulation/Gait assistance: Min guard Gait Distance (Feet): 120  Feet Assistive device: Rolling walker (2 wheeled) Gait Pattern/deviations: Trunk flexed Gait velocity: decreased   General Gait Details: ambulates with crouched gait with UE support on RW, slightly unsteady  Stairs            Wheelchair Mobility    Modified Rankin (Stroke Patients Only)       Balance Overall balance assessment: Needs assistance Sitting-balance support: No upper extremity supported Sitting balance-Leahy Scale: Good Sitting balance - Comments: seated EOB   Standing balance support: Bilateral upper extremity supported Standing balance-Leahy Scale: Fair Standing balance comment: fair with RW                             Pertinent Vitals/Pain Pain Assessment: No/denies pain    Home Living Family/patient expects to be discharged to:: Private residence Living Arrangements: Spouse/significant other Available Help at Discharge: Family Type of Home: House Home Access: Stairs to enter   Technical brewer of Steps: 2 Home Layout: Multi-level Home Equipment: Environmental consultant - 2 wheels;Cane - single point;Tub bench      Prior Function Level of Independence: Needs assistance   Gait / Transfers Assistance Needed: household ambulator with RW or SPC, frequent falls but has not fallen recently  ADL's / Homemaking Assistance Needed: independent with basic ADL        Hand Dominance        Extremity/Trunk Assessment   Upper Extremity Assessment Upper Extremity Assessment: Generalized weakness    Lower Extremity Assessment Lower Extremity Assessment: Generalized weakness    Cervical / Trunk Assessment Cervical /  Trunk Assessment: Kyphotic  Communication   Communication: No difficulties  Cognition Arousal/Alertness: Awake/alert Behavior During Therapy: WFL for tasks assessed/performed Overall Cognitive Status: Within Functional Limits for tasks assessed                                        General Comments       Exercises     Assessment/Plan    PT Assessment All further PT needs can be met in the next venue of care  PT Problem List Decreased strength;Decreased mobility;Decreased safety awareness;Decreased activity tolerance;Decreased balance       PT Treatment Interventions      PT Goals (Current goals can be found in the Care Plan section)  Acute Rehab PT Goals Patient Stated Goal: return home and go to outpatient PT appt on monday to get stronger PT Goal Formulation: With patient/family Time For Goal Achievement: 04/03/21 Potential to Achieve Goals: Good    Frequency     Barriers to discharge        Co-evaluation               AM-PAC PT "6 Clicks" Mobility  Outcome Measure Help needed turning from your back to your side while in a flat bed without using bedrails?: None Help needed moving from lying on your back to sitting on the side of a flat bed without using bedrails?: None Help needed moving to and from a bed to a chair (including a wheelchair)?: A Little Help needed standing up from a chair using your arms (e.g., wheelchair or bedside chair)?: A Little Help needed to walk in hospital room?: A Little Help needed climbing 3-5 steps with a railing? : A Lot 6 Click Score: 19    End of Session Equipment Utilized During Treatment: Gait belt Activity Tolerance: Patient tolerated treatment well Patient left: in bed;with call bell/phone within reach;with family/visitor present Nurse Communication: Mobility status PT Visit Diagnosis: Unsteadiness on feet (R26.81);Other abnormalities of gait and mobility (R26.89);Muscle weakness (generalized) (M62.81);History of falling (Z91.81)    Time: 5638-7564 PT Time Calculation (min) (ACUTE ONLY): 19 min   Charges:   PT Evaluation $PT Eval Low Complexity: 1 Low PT Treatments $Therapeutic Activity: 8-22 mins       2:14 PM, 04/03/21 Mearl Latin PT, DPT Physical Therapist at [facility]

## 2021-04-03 NOTE — Care Management CC44 (Signed)
Condition Code 44 Documentation Completed  Patient Details  Name: [name] MRN: [id number] Date of Birth: July 10, 1946   Condition Code 44 given:  Yes Patient signature on Condition Code 44 notice:  Yes Documentation of 2 MD's agreement:  Yes Code 44 added to claim:  Yes    Natasha Bence, LCSW 04/03/2021, 2:42 PM

## 2021-04-03 NOTE — Discharge Summary (Signed)
Physician Discharge Summary  [name] NFA:[id number] DOB: 04-19-1946 DOA: 04/02/2021  PCP: Noreene Larsson, NP  Admit date: 04/02/2021  Discharge date: 04/03/2021  Admitted From: home  Disposition:  home   Recommendations for Outpatient Follow-up:  Follow up with PCP Dr. Pearline Cables in 1-2 weeks Please obtain BMP/CBC in one week Please follow up on the following pending results: Blood culture, urine culture  Please take Augmentin as prescribed for 6 more days to complete 7 day course of treatment Please resume home medications Please follow up on CXR results of pulmonary nodules/ lobar pneumonia in setting of unintentional weight loss of 50 lbs over the last year. Colonoscopy was scheduled 6/29 but aborted due to poor prep.   Home Health: none - patient has outpatient PT   Equipment/Devices: none  Discharge Condition:Stable  CODE STATUS: Full  Diet recommendation: Heart Healthy  Brief/Interim Summary:  [name] is a 75 y.o. male with medical history significant for cardiomyopathy, hypertension, diabetes mellitus, obstructive sleep apnea presents to the ED with complaints of left back pain and non-productive cough of 1 day duration. Patient initially thought symptoms were related to his kidney, but urinalysis was unremarkable for infection and renal stone study showed no renal calculi or hydronephrosis.   Lab work showed markedly elevated WBC which has trended down close to baseline. Lactic acid was elevated 2.4 and trended down to 0.6.    CXR showed opacities/ consolidation of left lower lobe and lingularight middle lobe and right lower lobe suggestive of infectious or inflammatory processes. There were also multiple pulmonary nodules.  Of note, patient states that he has lost 50 lbs in the last year, especially after his knee surgery. Per chart review, PCP is aware and working this up.   Given patient's age, comorbidities (CHF), and severe pain, patient was admitted to  [facility] hospitalist team for inpatient treatment of pneumonia.   Patient was placed on IV antibiotics with improvement in hemodynamic status, and leukocytosis. Patient will transition to a 6-day course of oral antibiotics and follow up with PCP.  No other acute events or concerns noted throughout this admission.  Discharge Diagnoses:  Principal Problem:   PNA (pneumonia) Active Problems:   Hypertension   Type 2 diabetes mellitus with circulatory disorder (HCC)   Obstructive sleep apnea   Congestive heart failure ([place])  Principal discharge diagnosis: Community-acquired pneumonia  Discharge Instructions  Discharge Instructions     Diet - low sodium heart healthy   Complete by: As directed    Increase activity slowly   Complete by: As directed       Allergies as of 04/03/2021       Reactions   Codeine Nausea And Vomiting   Tramadol Nausea Only        Medication List     TAKE these medications    amoxicillin-clavulanate 875-125 MG tablet Commonly known as: Augmentin Take 1 tablet by mouth 2 (two) times daily for 6 days.   aspirin 81 MG EC tablet Take 1 tablet (81 mg total) by mouth daily. What changed: when to take this   b complex vitamins tablet Take 1 tablet by mouth daily.   carvedilol 3.125 MG tablet Commonly known as: COREG Take 0.5 tablets (1.5625 mg total) by mouth 2 (two) times daily with a meal.   clopidogrel 75 MG tablet Commonly known as: PLAVIX TAKE ONE (1) TABLET BY MOUTH EVERY DAY   Cyanocobalamin 1500 MCG Tbdp Take 1,500 mcg by mouth daily.  DULoxetine 60 MG capsule Commonly known as: CYMBALTA Take 1 capsule (60 mg total) by mouth daily.   empagliflozin 25 MG Tabs tablet Commonly known as: Jardiance Take 1 tablet (25 mg total) by mouth daily before breakfast.   FreeStyle Libre 14 Day Reader [facility] 14 Day Sensor Misc Inject 1 each into the skin every 14 (fourteen) days.   furosemide 40 MG tablet Commonly known  as: LASIX Take 1 tablet (40 mg total) by mouth daily.   guaiFENesin-dextromethorphan 100-10 MG/5ML syrup Commonly known as: ROBITUSSIN DM Take 10 mLs by mouth every 4 (four) hours as needed for cough.   levothyroxine 88 MCG tablet Commonly known as: SYNTHROID Take 1 tablet (88 mcg total) by mouth daily.   linagliptin 5 MG Tabs tablet Commonly known as: TRADJENTA Take 1 tablet (5 mg total) by mouth daily.   multivitamin with minerals Tabs tablet Take 1 tablet by mouth daily.   nitroGLYCERIN 0.4 MG SL tablet Commonly known as: Nitrostat Place 1 tablet (0.4 mg total) under the tongue every 5 (five) minutes as needed for chest pain.   pantoprazole 40 MG tablet Commonly known as: PROTONIX TAKE ONE (1) TABLET BY MOUTH EVERY DAY What changed: how much to take   potassium chloride 10 MEQ tablet Commonly known as: KLOR-CON TAKE ONE TABLET (10MEQ TOTAL) BY MOUTH DAILY What changed: See the new instructions.   pramipexole 0.25 MG tablet Commonly known as: MIRAPEX Take 2 tablets (0.5 mg total) by mouth 2 (two) times daily. Take 0.5mg  by mouth at 7 PM and 0.5mg  at 9 PM daily.   rosuvastatin 20 MG tablet Commonly known as: CRESTOR Take 1 tablet (20 mg total) by mouth daily.   UNABLE TO FIND Home health supplies for CPAP   UNABLE TO FIND [place] sock aid to help with support hose. Size XL.  Dx:I73.9, G25.81, R60.9   Vitamin D3 125 MCG (5000 UT) Tabs Take 5,000 Units by mouth 2 (two) times daily.        Follow-up Information     Noreene Larsson, NP. Schedule an appointment as soon as possible for a visit in 1 week(s).   Specialty: Nurse Practitioner Contact information: [address]  [address] [place] [address] [phone]                Allergies  Allergen Reactions   Codeine Nausea And Vomiting   Tramadol Nausea Only    Consultations: None   Procedures/Studies: DG Chest Portable 1 View  Result Date: 04/02/2021 CLINICAL DATA:   75 year old male with lung opacity noted on the recent CT abdomen pelvis with EXAM: PORTABLE CHEST 1 VIEW COMPARISON:  CT abdomen pelvis dated 04/02/2021 and chest radiograph dated 02/15/2021. FINDINGS: Left mid to lower lung field nodular and hazy density may represent atypical pneumonia or aspiration. Clinical correlation recommended. No focal consolidation, pleural effusion, or pneumothorax. The cardiac silhouette is within limits. Atherosclerotic calcification of the aorta. Osteopenia with degenerative changes of the spine. No acute osseous pathology. IMPRESSION: Left mid to lower lung field nodular and hazy density may represent atypical pneumonia or aspiration. Electronically Signed   By: Anner Crete M.D.   On: 04/02/2021 19:43   CT RENAL STONE STUDY  Result Date: 04/02/2021 CLINICAL DATA:  Flank pain, kidney stone suspected. EXAM: CT ABDOMEN AND PELVIS WITHOUT CONTRAST TECHNIQUE: Multidetector CT imaging of the abdomen and pelvis was performed following the standard protocol without IV contrast. COMPARISON:  Chest CT May 17, 2016. FINDINGS:  Lower chest: Ground-glass opacities in the dependent left lower lobe and lingula with a more consolidative area in the lingula along the major fissure. Patchy ground-glass opacities with interlobular septal thickening involving the right middle lobe and right lower lobe. Ground-glass 7 mm pulmonary nodule in the right middle lobe on image 16/4. Solid 7 mm pulmonary nodule in the right lower lobe. Additional bilateral 4 mm smaller pulmonary nodules. Normal size heart. Pericardial calcifications, unchanged from prior chest CT May 17, 2016, without pericardial effusion. Hepatobiliary: Unremarkable noncontrast appearance of the hepatic parenchyma. Gallbladder is mildly distended without evidence of acute inflammation, commonly seen in a fasting state. No biliary ductal dilation. Pancreas: Within normal limits. Spleen: Within normal limits. Adrenals/Urinary  Tract: Thickening of the bilateral adrenal glands without discrete nodularity, favor hyperplasia. No hydronephrosis. No renal, ureteral or bladder calculi visualized. Hypodense 2.6 cm left interpolar renal cyst. Trabecular appearance of the urinary bladder wall. Stomach/Bowel: Stomach is decompressed limiting evaluation. No pathologic dilation of small bowel. The appendix is not definitely visualized however there is no pericecal inflammation. Terminal ileum appears within normal limits. Moderate volume of formed stool throughout the colon. Left-sided colonic diverticulosis without findings of acute diverticulitis. Vascular/Lymphatic: Aortic and branch vessel atherosclerosis without aneurysmal dilation. No pathologically enlarged abdominal or pelvic lymph nodes. Reproductive: Dystrophic calcifications in the enlarged prostate. Other: No abdominopelvic ascites. Musculoskeletal: L4-L5 posterior spinal fusion hardware without evidence of complication. Multilevel degenerative changes spine with vacuum disc artifact and Modic type endplate changes at Z3-G6. Degenerative changes bilateral hips and SI joints. No acute osseous abnormality. IMPRESSION: 1. No hydronephrosis. No renal, ureteral or bladder calculi visualized. 2. Ground-glass opacities in the dependent left lower lobe and lingula with a more consolidative area in the lingula and patchy ground-glass opacities with interlobular septal thickening involving the right middle lobe and right lower lobe. Findings which are likely infectious or inflammatory. 3. Multiple pulmonary nodules measuring up to 7 mm. Non-contrast chest CT at 3-6 months is recommended. If the nodules are stable at time of repeat CT, then future CT at 18-24 months (from today's scan) is considered optional for low-risk patients, but is recommended for high-risk patients. This recommendation follows the consensus statement: Guidelines for Management of Incidental Pulmonary Nodules Detected on CT  Images: From the Fleischner Society 2017; Radiology 2017; 284:228-243. 4. Left-sided colonic diverticulosis without findings of acute diverticulitis. 5. Moderate volume of formed stool throughout the colon. 6. Trabecular appearance of the urinary bladder wall, likely sequela of chronic outflow impedance. 7.  Aortic Atherosclerosis (ICD10-I70.0). Electronically Signed   By: Dahlia Bailiff MD   On: 04/02/2021 17:29     Discharge Exam: Vitals:   04/03/21 0433 04/03/21 1346  BP: 136/71 126/68  Pulse: 88 70  Resp: 19   Temp: 97.7 F (36.5 C) 98 F (36.7 C)  SpO2: 94% 97%   Vitals:   04/02/21 2346 04/03/21 0001 04/03/21 0433 04/03/21 1346  BP: (!) 119/98  136/71 126/68  Pulse: 81  88 70  Resp: 18  19   Temp: 98.4 F (36.9 C)  97.7 F (36.5 C) 98 F (36.7 C)  TempSrc: Oral   Oral  SpO2: 97% 97% 94% 97%    General: Pt is alert, awake, not in acute distress Cardiovascular: RRR, S1/S2 +, no rubs, no gallops Respiratory: CTA bilaterally, no wheezing, no rhonchi Abdominal: Soft, NT, ND, bowel sounds + Extremities: no edema, no cyanosis    The results of significant diagnostics from this hospitalization (including imaging, microbiology, ancillary and  laboratory) are listed below for reference.     Microbiology: Recent Results (from the past 240 hour(s))  Culture, blood (routine x 2)     Status: None (Preliminary result)   Collection Time: 04/02/21  5:12 PM   Specimen: BLOOD RIGHT ARM  Result Value Ref Range Status   Specimen Description   Final    BLOOD RIGHT ARM BOTTLES DRAWN AEROBIC AND ANAEROBIC   Special Requests   Final    Blood Culture adequate volume Performed at [facility], [address] [place], [place] [address]    Culture PENDING  Incomplete   Report Status PENDING  Incomplete  Culture, blood (routine x 2)     Status: None (Preliminary result)   Collection Time: 04/02/21  5:12 PM   Specimen: BLOOD LEFT ARM  Result Value Ref Range Status   Specimen Description  BLOOD LEFT ARM BOTTLES DRAWN AEROBIC AND ANAEROBIC  Final   Special Requests   Final    Blood Culture adequate volume Performed at [facility], [address] [place], [place] [address]    Culture PENDING  Incomplete   Report Status PENDING  Incomplete  Resp Panel by RT-PCR (Flu A&B, Covid) Nasopharyngeal Swab     Status: None   Collection Time: 04/02/21  5:53 PM   Specimen: Nasopharyngeal Swab; Nasopharyngeal(NP) swabs in vial transport medium  Result Value Ref Range Status   SARS Coronavirus 2 by RT PCR NEGATIVE NEGATIVE Final    Comment: (NOTE) SARS-CoV-2 target nucleic acids are NOT DETECTED.  The SARS-CoV-2 RNA is generally detectable in upper respiratory specimens during the acute phase of infection. The lowest concentration of SARS-CoV-2 viral copies this assay can detect is 138 copies/mL. A negative result does not preclude SARS-Cov-2 infection and should not be used as the sole basis for treatment or other patient management decisions. A negative result may occur with  improper specimen collection/handling, submission of specimen other than nasopharyngeal swab, presence of viral mutation(s) within the areas targeted by this assay, and inadequate number of viral copies(<138 copies/mL). A negative result must be combined with clinical observations, patient history, and epidemiological information. The expected result is Negative.  Fact Sheet for Patients:  EntrepreneurPulse.com.au  Fact Sheet for Healthcare Providers:  IncredibleEmployment.be  This test is no t yet approved or cleared by the [place] FDA and  has been authorized for detection and/or diagnosis of SARS-CoV-2 by FDA under an Emergency Use Authorization (EUA). This EUA will remain  in effect (meaning this test can be used) for the duration of the COVID-19 declaration under Section 564(b)(1) of the Act, 21 U.S.C.section 360bbb-3(b)(1), unless the authorization is  terminated  or revoked sooner.       Influenza A by PCR NEGATIVE NEGATIVE Final   Influenza B by PCR NEGATIVE NEGATIVE Final    Comment: (NOTE) The Xpert Xpress SARS-CoV-2/FLU/RSV plus assay is intended as an aid in the diagnosis of influenza from Nasopharyngeal swab specimens and should not be used as a sole basis for treatment. Nasal washings and aspirates are unacceptable for Xpert Xpress SARS-CoV-2/FLU/RSV testing.  Fact Sheet for Patients: EntrepreneurPulse.com.au  Fact Sheet for Healthcare Providers: IncredibleEmployment.be  This test is not yet approved or cleared by the [place] FDA and has been authorized for detection and/or diagnosis of SARS-CoV-2 by FDA under an Emergency Use Authorization (EUA). This EUA will remain in effect (meaning this test can be used) for the duration of the COVID-19 declaration under Section 564(b)(1) of the Act, 21 U.S.C. section  360bbb-3(b)(1), unless the authorization is terminated or revoked.  Performed at [facility], [address], [place], [place] [address]      Labs: BNP (last 3 results) No results for input(s): BNP in the last 8760 hours. Basic Metabolic Panel: Recent Labs  Lab 04/02/21 1523 04/03/21 0604  NA 136 137  K 3.7 4.0  CL 103 104  CO2 26 27  GLUCOSE 117* 95  BUN 13 12  CREATININE 0.73 0.62  CALCIUM 8.5* 8.3*   Liver Function Tests: Recent Labs  Lab 04/02/21 1523  AST 16  ALT 13  ALKPHOS 52  BILITOT 0.8  PROT 6.9  ALBUMIN 3.9   No results for input(s): LIPASE, AMYLASE in the last 168 hours. No results for input(s): AMMONIA in the last 168 hours. CBC: Recent Labs  Lab 04/02/21 1523 04/03/21 0604  WBC 19.6* 10.9*  NEUTROABS 17.9*  --   HGB 11.1* 9.8*  HCT 33.5* 30.3*  MCV 102.1* 103.1*  PLT 251 218   Cardiac Enzymes: No results for input(s): CKTOTAL, CKMB, CKMBINDEX, TROPONINI in the last 168 hours. BNP: Invalid input(s): POCBNP CBG: No results  for input(s): GLUCAP in the last 168 hours. D-Dimer No results for input(s): DDIMER in the last 72 hours. Hgb A1c No results for input(s): HGBA1C in the last 72 hours. Lipid Profile No results for input(s): CHOL, HDL, LDLCALC, TRIG, CHOLHDL, LDLDIRECT in the last 72 hours. Thyroid function studies No results for input(s): TSH, T4TOTAL, T3FREE, THYROIDAB in the last 72 hours.  Invalid input(s): FREET3 Anemia work up No results for input(s): VITAMINB12, FOLATE, FERRITIN, TIBC, IRON, RETICCTPCT in the last 72 hours. Urinalysis    Component Value Date/Time   COLORURINE YELLOW 04/02/2021 1452   APPEARANCEUR CLEAR 04/02/2021 1452   LABSPEC 1.013 04/02/2021 1452   PHURINE 6.0 04/02/2021 1452   GLUCOSEU NEGATIVE 04/02/2021 1452   HGBUR NEGATIVE 04/02/2021 1452   BILIRUBINUR NEGATIVE 04/02/2021 1452   KETONESUR NEGATIVE 04/02/2021 1452   PROTEINUR NEGATIVE 04/02/2021 1452   UROBILINOGEN 0.2 11/23/2013 1530   NITRITE NEGATIVE 04/02/2021 1452   LEUKOCYTESUR NEGATIVE 04/02/2021 1452   Sepsis Labs Invalid input(s): PROCALCITONIN,  WBC,  LACTICIDVEN Microbiology Recent Results (from the past 240 hour(s))  Culture, blood (routine x 2)     Status: None (Preliminary result)   Collection Time: 04/02/21  5:12 PM   Specimen: BLOOD RIGHT ARM  Result Value Ref Range Status   Specimen Description   Final    BLOOD RIGHT ARM BOTTLES DRAWN AEROBIC AND ANAEROBIC   Special Requests   Final    Blood Culture adequate volume Performed at [facility], [address], [place], [place] [address]    Culture PENDING  Incomplete   Report Status PENDING  Incomplete  Culture, blood (routine x 2)     Status: None (Preliminary result)   Collection Time: 04/02/21  5:12 PM   Specimen: BLOOD LEFT ARM  Result Value Ref Range Status   Specimen Description BLOOD LEFT ARM BOTTLES DRAWN AEROBIC AND ANAEROBIC  Final   Special Requests   Final    Blood Culture adequate volume Performed at [facility], [address], [place], [place] [address]    Culture PENDING  Incomplete   Report Status PENDING  Incomplete  Resp Panel by RT-PCR (Flu A&B, Covid) Nasopharyngeal Swab     Status: None   Collection Time: 04/02/21  5:53 PM   Specimen: Nasopharyngeal Swab; Nasopharyngeal(NP) swabs in vial transport medium  Result Value Ref Range Status  SARS Coronavirus 2 by RT PCR NEGATIVE NEGATIVE Final    Comment: (NOTE) SARS-CoV-2 target nucleic acids are NOT DETECTED.  The SARS-CoV-2 RNA is generally detectable in upper respiratory specimens during the acute phase of infection. The lowest concentration of SARS-CoV-2 viral copies this assay can detect is 138 copies/mL. A negative result does not preclude SARS-Cov-2 infection and should not be used as the sole basis for treatment or other patient management decisions. A negative result may occur with  improper specimen collection/handling, submission of specimen other than nasopharyngeal swab, presence of viral mutation(s) within the areas targeted by this assay, and inadequate number of viral copies(<138 copies/mL). A negative result must be combined with clinical observations, patient history, and epidemiological information. The expected result is Negative.  Fact Sheet for Patients:  EntrepreneurPulse.com.au  Fact Sheet for Healthcare Providers:  IncredibleEmployment.be  This test is no t yet approved or cleared by the [place] FDA and  has been authorized for detection and/or diagnosis of SARS-CoV-2 by FDA under an Emergency Use Authorization (EUA). This EUA will remain  in effect (meaning this test can be used) for the duration of the COVID-19 declaration under Section 564(b)(1) of the Act, 21 U.S.C.section 360bbb-3(b)(1), unless the authorization is terminated  or revoked sooner.       Influenza A by PCR NEGATIVE NEGATIVE Final   Influenza B by PCR NEGATIVE NEGATIVE Final    Comment: (NOTE) The Xpert  Xpress SARS-CoV-2/FLU/RSV plus assay is intended as an aid in the diagnosis of influenza from Nasopharyngeal swab specimens and should not be used as a sole basis for treatment. Nasal washings and aspirates are unacceptable for Xpert Xpress SARS-CoV-2/FLU/RSV testing.  Fact Sheet for Patients: EntrepreneurPulse.com.au  Fact Sheet for Healthcare Providers: IncredibleEmployment.be  This test is not yet approved or cleared by the [place] FDA and has been authorized for detection and/or diagnosis of SARS-CoV-2 by FDA under an Emergency Use Authorization (EUA). This EUA will remain in effect (meaning this test can be used) for the duration of the COVID-19 declaration under Section 564(b)(1) of the Act, 21 U.S.C. section 360bbb-3(b)(1), unless the authorization is terminated or revoked.  Performed at [facility], [address], [place], [place] [address]      Time coordinating discharge: 35 minutes  SIGNED:   Rodena Goldmann, DO [facility] 04/03/2021, 2:02 PM  If 7PM-7AM, please contact night-coverage [email or web address]

## 2021-04-03 NOTE — Care Management Obs Status (Signed)
[facility] NOTIFICATION   Patient Details  Name: [name] MRN: [id number] Date of Birth: 01/16/1946   Medicare Observation Status Notification Given:  Yes    Natasha Bence, LCSW 04/03/2021, 2:42 PM

## 2021-04-03 NOTE — Care Management Important Message (Signed)
Important Message  Patient Details  Name: [name] MRN: [id number] Date of Birth: 1946/08/04   Medicare Important Message Given:  Yes     Tommy Medal 04/03/2021, 2:38 PM

## 2021-04-04 LAB — URINE CULTURE

## 2021-04-06 ENCOUNTER — Encounter (HOSPITAL_COMMUNITY): Payer: Self-pay

## 2021-04-06 ENCOUNTER — Ambulatory Visit (HOSPITAL_COMMUNITY): Payer: HMO | Attending: Family Medicine

## 2021-04-06 ENCOUNTER — Other Ambulatory Visit: Payer: Self-pay

## 2021-04-06 DIAGNOSIS — M6281 Muscle weakness (generalized): Secondary | ICD-10-CM | POA: Insufficient documentation

## 2021-04-06 DIAGNOSIS — L97411 Non-pressure chronic ulcer of right heel and midfoot limited to breakdown of skin: Secondary | ICD-10-CM | POA: Diagnosis not present

## 2021-04-06 DIAGNOSIS — R262 Difficulty in walking, not elsewhere classified: Secondary | ICD-10-CM | POA: Insufficient documentation

## 2021-04-06 DIAGNOSIS — E08621 Diabetes mellitus due to underlying condition with foot ulcer: Secondary | ICD-10-CM | POA: Diagnosis not present

## 2021-04-06 DIAGNOSIS — R2681 Unsteadiness on feet: Secondary | ICD-10-CM | POA: Diagnosis not present

## 2021-04-06 NOTE — Therapy (Signed)
[facility] [address] [place], [place], [address] Phone: [phone]   Fax:  [phone]  Physical Therapy Evaluation  Patient Details  Name: DHANI DANNEMILLER MRN: [id number] Date of Birth: June 22, 1946 Referring Provider (PT): Noreene Larsson, NP   Encounter Date: 04/06/2021   PT End of Session - 04/06/21 0947     Visit Number 1    Number of Visits 12    Date for PT Re-Evaluation 05/18/21    Authorization Type [employer], no VL, no auth ,    Progress Note Due on Visit 10    PT Start Time 0946    PT Stop Time 1030    PT Time Calculation (min) 44 min    Equipment Utilized During Treatment Gait belt    Activity Tolerance Patient tolerated treatment well    Behavior During Therapy [facility] for tasks assessed/performed             Past Medical History:  Diagnosis Date   Aftercare following surgery of the circulatory system, NEC 12/04/2013   Angina decubitus ([place]) 05/19/2016   Arrhythmia 05/20/2016   Arthritis    ARTHRITIS, RIGHT FOOT 06/26/2008   Qualifier: Diagnosis of  By: Aline Brochure MD, [name]     Cardiomyopathy- EF NL 01/2013, now 30-35% echo 03/14/2012   Carotid artery occlusion    left s/p CEA   Cellulitis of left foot 05/07/2018   CHF (congestive heart failure) ([place])    Critical lower limb ischemia ([place]) 02/28/2014   Critical limb ischemia    Diabetes mellitus    Elevated troponin 04/11/2016   GERD (gastroesophageal reflux disease)    History of stroke June 2013 03/14/2012   HOH (hard of hearing)    Hypertension    Hypothyroidism    Nonhealing skin ulcer ([place]) 10/28/2014   Obstructive sleep apnea    Orthostatic hypotension    Osteomyelitis ([place])    left great toe   Pain in the chest 05/17/2016   Peripheral arterial disease ([place]), s/p PTA x 2 RLE    nonhealing ulcers bilaterally on each great toe   Pneumonia    PONV (postoperative nausea and vomiting)    Pulmonary nodule 05/17/2016   Restless leg syndrome     Shortness of breath    Stroke [facility]) March 08, 2012   Toe osteomyelitis, left [facility])    Ulcer of great toe, left, with necrosis of bone ([place])    Wears dentures     Past Surgical History:  Procedure Laterality Date   AMPUTATION TOE Left 07/25/2018   Procedure: AMPUTATION TOE INTERPHALANGEAL HALLUX LEFT;  Surgeon: Evelina Bucy, DPM;  Location: [place];  Service: Podiatry;  Laterality: Left;   ANGIOPLASTY  02/28/14   diamond back orbital rotational atherectomy of Rt. tibial   BACK SURGERY     BONE BIOPSY Left 07/25/2018   Procedure: SUPERFICIAL BONE BIOPSY;  Surgeon: Evelina Bucy, DPM;  Location: [place];  Service: Podiatry;  Laterality: Left;   CARDIAC CATHETERIZATION N/A 05/19/2016   Procedure: Right/Left Heart Cath and Coronary Angiography;  Surgeon: Belva Crome, MD;  Location: [place] CV LAB;  Service: Cardiovascular;  Laterality: N/A;   CERVICAL FUSION     ENDARTERECTOMY Left 11/29/2013   Procedure: ENDARTERECTOMY CAROTID;  Surgeon: Serafina Mitchell, MD;  Location: [place];  Service: Vascular;  Laterality: Left;   ESOPHAGOGASTRODUODENOSCOPY  02/2010   Dr. Gala Romney: patient presented with food impaction, schatzki ring with superimposed component of stricture with erosive  reflux esophagitis, s/p disimpaction but dilation planned at later date    ESOPHAGOGASTRODUODENOSCOPY (EGD) WITH PROPOFOL N/A 08/13/2019   Dr. Gala Romney: Erosive reflux esophagitis with mild stricture and incidental Mallory-Weiss tear which precluded esophageal dilation.  Medium sized hiatal hernia.   ESOPHAGOGASTRODUODENOSCOPY (EGD) WITH PROPOFOL N/A 10/11/2019   Dr. Gala Romney: Esophageal stenosis status post dilation, moderate hiatal hernia   EYE SURGERY     FLEXIBLE SIGMOIDOSCOPY N/A 08/13/2019   Procedure: FLEXIBLE SIGMOIDOSCOPY;  Surgeon: [name], MD;  [facility];  Service: Endoscopy;  Laterality: N/A;  colonoscopy aboerted due to formed stool and poor prep   FOOT SURGERY     KNEE ARTHROSCOPY WITH MEDIAL  MENISECTOMY Left 05/06/2020   Procedure: KNEE ARTHROSCOPY WITH MEDIAL MENISCECTOMY AND LATERAL MENISCECTOMY;  Surgeon: [name], MD;  Location: [facility];  Service: Orthopedics;  Laterality: Left;   Lower ext duplex doppler  03/14/14   Rt ABI 1.2   LOWER EXTREMITY ANGIOGRAM Bilateral 02/18/2014   Procedure: LOWER EXTREMITY ANGIOGRAM;  Surgeon: [name], MD;  Location: [facility] CATH LAB;  Service: Cardiovascular;  Laterality: Bilateral;   LOWER EXTREMITY ANGIOGRAM N/A 10/31/2014   Procedure: LOWER EXTREMITY ANGIOGRAM;  Surgeon: [name], MD;  Location: [facility] CATH LAB;  Service: Cardiovascular;  Laterality: N/A;   MALONEY DILATION N/A 10/11/2019   Procedure: Venia Minks DILATION;  Surgeon: [name], MD;  [facility];  Service: Endoscopy;  Laterality: N/A;   MULTIPLE TOOTH EXTRACTIONS     PV angiogram  02/18/2014   tibial vessel diseas bil.   SPINE SURGERY     tendon achillies lengthing and sesamoid      There were no vitals filed for this visit.    Subjective Assessment - 04/06/21 0952     Subjective Pt notes multiple co-morbidities and notes difficulty with walking due severe left leg issues related to a fall and had recent hx of fall injuring right hip which resulted in immobility and was at [facility] from June 0-38 and recently D/C home and denies any falls since returning home. Pt notes a wound/callous on plantar surface of right foot. Recently hospitalized July 14-15 due to left lower lobe PNA    Pertinent History CHF , R foot wound, left knee scope, DB, Restless leg syndrome, hx of stroke.    Limitations House hold activities;Standing;Lifting    Patient Stated Goals be able to walk normally and function correctly.    Currently in Pain? No/denies    Pain Score 0-No pain                [facility] PT Assessment - 04/06/21 0001       Assessment   Medical Diagnosis R foot ulcer and gait instability    Referring Provider (PT) Noreene Larsson, NP      Balance  Screen   Has the patient fallen in the past 6 months Yes    How many times? multiple    Has the patient had a decrease in activity level because of a fear of falling?  Yes    Is the patient reluctant to leave their home because of a fear of falling?  Yes      [place] Private residence    Living Arrangements Spouse/significant other    Available Help at Discharge Family    Type of [place] to enter    Entrance Stairs-Number of Steps 2  Entrance Stairs-Rails None    Home Layout Multi-level    Alternate Level Stairs-Number of Steps 11    Alternate Level Stairs-Rails Right    Home Equipment Walker - 4 wheels      Prior Function   Level of Independence Independent with household mobility with device;Independent with transfers    Vocation Retired      Observation/Other Assessments   Focus on Therapeutic Outcomes (FOTO)  50% function      ROM / Strength   AROM / PROM / Strength AROM;Strength      Strength   Right Hip Flexion 3/5    Right Hip Extension 2+/5    Right Hip ABduction 3-/5    Right Hip ADduction 3/5    Left Hip Flexion 3/5    Left Hip Extension 2+/5    Left Hip ABduction 3-/5    Left Hip ADduction 3/5    Right Knee Flexion 3/5    Right Knee Extension 3/5    Left Knee Flexion 3/5    Left Knee Extension 3/5      Flexibility   Soft Tissue Assessment /Muscle Length yes    Hamstrings Rt: 50 deg, Lt: 45 deg, lacking       Transfers   Transfers Sit to Stand;Stand to Sit    Sit to Stand 5: Supervision;With upper extremity assist    Five time sit to stand comments  26 sec with BUE support needed      Ambulation/Gait   Ambulation/Gait Yes    Ambulation/Gait Assistance 4: Min guard    Ambulation Distance (Feet) 60 Feet    Assistive device Rollator    Gait Pattern Step-to pattern;Decreased weight shift to left;Decreased hip/knee flexion - left;Decreased hip/knee flexion - right;Scissoring;Shuffle    Ambulation  Surface Level;Indoor    Gait velocity decreased    Stairs Yes    Stairs Assistance 4: Min guard    Stair Management Technique Two rails;Step to pattern    Number of Stairs 4    Height of Stairs 6    Gait Comments 2MWT                        Objective measurements completed on examination: See above findings.       [place] Adult PT Treatment/Exercise - 04/06/21 0001       Knee/Hip Exercises: Supine   Hip Adduction Isometric Strengthening;Both;2 sets;10 reps    Hip Adduction Isometric Limitations alternating hip add/abd isometric                    PT Education - 04/06/21 1019     Education Details education on assessment findings, and HEP initiation    Person(s) Educated Patient    Methods Explanation;Demonstration;Handout    Comprehension Verbalized understanding;Returned demonstration              PT Short Term Goals - 04/06/21 1026       PT SHORT TERM GOAL #1   Title Patient will be independent in self management strategies to improve quality of life and functional outcomes.    Time 3    Period Weeks    Status New    Target Date 04/27/21      PT SHORT TERM GOAL #2   Title Patient will be able to report at least 25% improvement in overall mobility.    Time 3    Period Weeks    Status New    Target Date 04/27/21  PT SHORT TERM GOAL #3   Title Demo improved balance and BLE strength as evidenced by time of 18 sec 5xSTS test    Baseline 26 sec with BUE support    Time 3    Period Weeks    Status New    Target Date 04/27/21               PT Long Term Goals - 04/06/21 1029       PT LONG TERM GOAL #1   Title Patient will improve FOTO score by at least 10 points in order to indicate improved tolerance to activity.    Baseline 50% function    Time 6    Period Weeks    Status New    Target Date 05/18/21      PT LONG TERM GOAL #2   Title Demo improved gait velocity and safety as evidenced by distance of 150 ft during  2MWT    Baseline 60 ft with rollator    Time 6    Period Weeks    Status New    Target Date 05/18/21                    Plan - 04/06/21 1021     Clinical Impression Statement Patient is a  75 yo male presenting to physical therapy with c/o generalized weakness, unsteadiness on feet, difficulty in walking. He presents with pain limited deficits in BLE strength, ROM, endurance, postural impairments, spinal mobility and functional mobility with ADL. He is having to modify and restrict ADL as indicated by FOTO score as well as subjective information and objective measures which is affecting overall participation. Patient will benefit from skilled physical therapy in order to improve function and reduce impairment.    Personal Factors and Comorbidities Comorbidity 1;Comorbidity 3+;Comorbidity 2    Comorbidities CHF, left knee surgery, right chronic foot wound, DB, restless leg syndromes    Examination-Activity Limitations Bathing;Lift;Locomotion Level;Transfers;Stand;Stairs;Squat;Reach Overhead;Carry    Examination-Participation Restrictions Community Activity;Meal Prep    Stability/Clinical Decision Making Evolving/Moderate complexity    Clinical Decision Making Moderate    Rehab Potential Fair    PT Frequency 2x / week    PT Duration 6 weeks    PT Treatment/Interventions ADLs/Self Care Home Management;Other (comment);Moist Heat;Balance training;Therapeutic exercise;Therapeutic activities;Functional mobility training;Stair training;Gait training;DME Instruction;Ultrasound;Neuromuscular re-education;Patient/family education;Orthotic Fit/Training;Manual techniques;Taping;Passive range of motion;Electrical Stimulation    PT Next Visit Plan Continue with BLE strength, balance    PT Home Exercise Plan hip abd/add isometric, QS, hamstring stretch    Consulted and Agree with Plan of Care Patient             Patient will benefit from skilled therapeutic intervention in order to improve  the following deficits and impairments:  Abnormal gait, Decreased endurance, Decreased skin integrity, Increased edema, Decreased activity tolerance, Decreased balance, Decreased mobility, Decreased knowledge of use of DME, Decreased strength, Difficulty walking, Pain, Decreased range of motion, Impaired flexibility, Postural dysfunction, Improper body mechanics  Visit Diagnosis: Difficulty in walking, not elsewhere classified  Muscle weakness (generalized)  Diabetic ulcer of right midfoot associated with diabetes mellitus due to underlying condition, limited to breakdown of skin (HCC)  Unsteadiness on feet     Problem List Patient Active Problem List   Diagnosis Date Noted   Pneumonia 04/03/2021   PNA (pneumonia) 04/02/2021   Abnormal finding on urinalysis 04/01/2021   Syncope 02/16/2021   Minor head injury    Right hip pain 02/11/2021   Erectile  dysfunction 01/22/2021   Gait abnormality 09/30/2020   Mild non proliferative diabetic retinopathy ([place]) 07/16/2020   Need for immunization against influenza 06/03/2020   S/P left knee arthroscopy 05/06/20 05/13/2020   Abnormal MRI 03/26/2020   Joint disorder of knee 03/26/2020   Fall at home, initial encounter 03/13/2020   Anemia 09/03/2019   Reflux esophagitis 09/03/2019   Esophageal dysphagia 05/30/2019   Constipation 05/30/2019   Positive colorectal cancer screening using Cologuard test 05/30/2019   Diabetic foot ulcer ([place]) 09/06/2018   Orthostasis 08/25/2016   Congestive heart failure ([place]) 05/17/2016   Bilateral carotid artery disease ([place]) 07/08/2014   Obstructive sleep apnea 02/12/2014   Restless legs 10/23/2013   Overweight with body mass index (BMI) of 29 to 29.9 in adult 10/23/2013   Hyperlipidemia 07/16/2013   Peripheral arterial disease ([place]) 07/16/2013   Hypertension 03/14/2012   Type 2 diabetes mellitus with circulatory disorder ([place]) 03/14/2012   Peripheral neuropathy 03/14/2012   Cardiomyopathy- EF NL 01/2013,  now 30-35% echo 03/14/2012   10:44 AM, 04/06/21 M. Sherlyn Lees, PT, DPT Physical Therapist- [place] Office Number: [phone]   [facility] [address] [place], [place], [address] Phone: [phone]   Fax:  [phone]  Name: ADAIAH MORKEN MRN: [id number] Date of Birth: 1946/07/18

## 2021-04-06 NOTE — Patient Instructions (Signed)
Access Code: [email or web address] URL: [email or web address] Date: 04/06/2021 Prepared by: Sherlyn Lees  Exercises Hooklying Isometric Hip Abduction Adduction with Belt and Ball - 1 x daily - 7 x weekly - 3 sets - 10 reps - 2 sec hold Supine Quad Set - 1 x daily - 7 x weekly - 3 sets - 10 reps - 2 sec hold Hooklying Hamstring Stretch with Strap - 1 x daily - 7 x weekly - 3 sets - 3 reps - 60 sec hold

## 2021-04-07 ENCOUNTER — Telehealth: Payer: Self-pay

## 2021-04-07 LAB — CULTURE, BLOOD (ROUTINE X 2)
Culture: NO GROWTH
Culture: NO GROWTH
Special Requests: ADEQUATE
Special Requests: ADEQUATE

## 2021-04-07 NOTE — Telephone Encounter (Signed)
Patient left voicemail returning call. Call back # [phone]

## 2021-04-07 NOTE — Telephone Encounter (Signed)
Transition Care Management Unsuccessful Follow-up Telephone Call  Date of discharge and from where:  04/03/2021 [facility] hospital   Attempts:  1st Attempt  Reason for unsuccessful TCM follow-up call:  Left voice message

## 2021-04-07 NOTE — Progress Notes (Signed)
Cardiology Clinic Note   Patient Name: [name] Date of Encounter: 04/08/2021  Primary Care Provider:  Noreene Larsson, NP Primary Cardiologist:  Quay Burow, MD  Patient Profile    [name] 75 year old male presents the clinic today for follow-up evaluation of his peripheral arterial disease and hypertension.  Past Medical History    Past Medical History:  Diagnosis Date   Aftercare following surgery of the circulatory system, NEC 12/04/2013   Angina decubitus ([place]) 05/19/2016   Arrhythmia 05/20/2016   Arthritis    ARTHRITIS, RIGHT FOOT 06/26/2008   Qualifier: Diagnosis of  By: Aline Brochure MD, [name]     Cardiomyopathy- EF NL 01/2013, now 30-35% echo 03/14/2012   Carotid artery occlusion    left s/p CEA   Cellulitis of left foot 05/07/2018   CHF (congestive heart failure) (HCC)    Critical lower limb ischemia ([place]) 02/28/2014   Critical limb ischemia    Diabetes mellitus    Elevated troponin 04/11/2016   GERD (gastroesophageal reflux disease)    History of stroke June 2013 03/14/2012   HOH (hard of hearing)    Hypertension    Hypothyroidism    Nonhealing skin ulcer ([place]) 10/28/2014   Obstructive sleep apnea    Orthostatic hypotension    Osteomyelitis (HCC)    left great toe   Pain in the chest 05/17/2016   Peripheral arterial disease ([place]), s/p PTA x 2 RLE    nonhealing ulcers bilaterally on each great toe   Pneumonia    PONV (postoperative nausea and vomiting)    Pulmonary nodule 05/17/2016   Restless leg syndrome    Shortness of breath    Stroke [facility]) March 08, 2012   Toe osteomyelitis, left [facility])    Ulcer of great toe, left, with necrosis of bone ([place])    Wears dentures    Past Surgical History:  Procedure Laterality Date   AMPUTATION TOE Left 07/25/2018   Procedure: AMPUTATION TOE INTERPHALANGEAL HALLUX LEFT;  Surgeon: Evelina Bucy, DPM;  Location: [place];  Service: Podiatry;  Laterality: Left;   ANGIOPLASTY  02/28/14   diamond back orbital  rotational atherectomy of Rt. tibial   BACK SURGERY     BONE BIOPSY Left 07/25/2018   Procedure: SUPERFICIAL BONE BIOPSY;  Surgeon: Evelina Bucy, DPM;  Location: [place];  Service: Podiatry;  Laterality: Left;   CARDIAC CATHETERIZATION N/A 05/19/2016   Procedure: Right/Left Heart Cath and Coronary Angiography;  Surgeon: Belva Crome, MD;  Location: [place] [facility];  Service: Cardiovascular;  Laterality: N/A;   CERVICAL FUSION     ENDARTERECTOMY Left 11/29/2013   Procedure: ENDARTERECTOMY CAROTID;  Surgeon: Serafina Mitchell, MD;  Location: [place];  Service: Vascular;  Laterality: Left;   ESOPHAGOGASTRODUODENOSCOPY  02/2010   Dr. Gala Romney: patient presented with food impaction, schatzki ring with superimposed component of stricture with erosive reflux esophagitis, s/p disimpaction but dilation planned at later date    ESOPHAGOGASTRODUODENOSCOPY (EGD) WITH PROPOFOL N/A 08/13/2019   Dr. Gala Romney: Erosive reflux esophagitis with mild stricture and incidental Mallory-Weiss tear which precluded esophageal dilation.  Medium sized hiatal hernia.   ESOPHAGOGASTRODUODENOSCOPY (EGD) WITH PROPOFOL N/A 10/11/2019   Dr. Gala Romney: Esophageal stenosis status post dilation, moderate hiatal hernia   EYE SURGERY     FLEXIBLE SIGMOIDOSCOPY N/A 08/13/2019   Procedure: FLEXIBLE SIGMOIDOSCOPY;  Surgeon: [name], MD;  [facility];  Service: Endoscopy;  Laterality: N/A;  colonoscopy aboerted due to formed stool and poor  prep   FOOT SURGERY     KNEE ARTHROSCOPY WITH MEDIAL MENISECTOMY Left 05/06/2020   Procedure: KNEE ARTHROSCOPY WITH MEDIAL MENISCECTOMY AND LATERAL MENISCECTOMY;  Surgeon: [name], MD;  Location: [facility];  Service: Orthopedics;  Laterality: Left;   Lower ext duplex doppler  03/14/14   Rt ABI 1.2   LOWER EXTREMITY ANGIOGRAM Bilateral 02/18/2014   Procedure: LOWER EXTREMITY ANGIOGRAM;  Surgeon: [name], MD;  Location: [facility] C[facility];  Service: Cardiovascular;  Laterality:  Bilateral;   LOWER EXTREMITY ANGIOGRAM N/A 10/31/2014   Procedure: LOWER EXTREMITY ANGIOGRAM;  Surgeon: [name], MD;  Location: [facility] CATH LAB;  Service: Cardiovascular;  Laterality: N/A;   MALONEY DILATION N/A 10/11/2019   Procedure: Venia Minks DILATION;  Surgeon: [name], MD;  [facility];  Service: Endoscopy;  Laterality: N/A;   MULTIPLE TOOTH EXTRACTIONS     PV angiogram  02/18/2014   tibial vessel diseas bil.   SPINE SURGERY     tendon achillies lengthing and sesamoid      Allergies  Allergies  Allergen Reactions   Codeine Nausea And Vomiting   Tramadol Nausea Only    History of Present Illness    [name] has a PMH of PAD, HTN, cardiomyopathy, mixed hyperlipidemia, OSA, and bilateral carotid artery stenosis.  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  He had CVA 03/08/2012 and his carotid disease is followed by Dr. [name].  Dr. Gwenlyn Found performed angiography 03/19/2014 which showed severe tibial disease from ultimately diamondback orbital rotational arthrectomy of his right posterior tibial was performed and reestablished blood flow.  Improvement was also seen in his symptoms and lower extremity Dopplers.  His Doppler studies were again performed 02/24/2016 which showed normal ABIs bilaterally.  He was admitted with congestive heart failure 05/16/2016 and diuresed.  His EF was 30-35% at that time.  He underwent R/L catheterization by Dr. Tamala Julian which showed elevated LVEDP and moderate diffuse CAD.  Medical management was recommended.  He remained medically compliant.  He developed osteomyelitis of his left great toe and had successful amputation.  His lower extremity arterial Dopplers performed 08/21/2019 showed normal ABIs bilaterally with patent tibial vessels.  He was referred to Dr. Gwenlyn Found for evaluation of his nonhealing small ulcers of the dorsal side of his great toes bilaterally.  He was seen in the office 08/01/2018.  During that time he denied  chest pain, shortness of breath, and claudication.  He reported that his great toe ulcers have been present for 6-9 months and have been healing slowly.  He was seen by Dr. Gwenlyn Found 09/19/2019.  He continued to be stable from a cardiac standpoint and denied chest pain shortness of breath.  His EKG showed sinus rhythm nonspecific ST and T wave changes, 77 bpm.  He was admitted to the hospital with pneumonia 04/02/2021 - 04/03/2021.  He was treated with IV antibiotics.  He was noted to have improvement in his hemodynamic status and leukocytosis.  He was transitioned to a 6-day course of oral antibiotics and instructed follow-up with his PCP.  He presents to the clinic today for follow-up evaluation states his energy and stamina is improving.  He reports his breathing has returned to baseline.  He does have an occasional cough.  He reports compliance with his p.o. antibiotics.  He is going to outpatient rehab 2 times per week and continues to do his physical therapy exercises at home on off days.  He denies lower extremity  claudication today but does note that he occasionally has claudication.  He remarks that he has not had cramps for several months.  We reviewed his carvedilol medication.  I will have him follow-up with Dr. Gwenlyn Found after his lower extremity ultrasounds, increase his physical activity as tolerated, maintain blood pressure log, and continue his heart healthy diet.  I have encouraged him to start deep breathing and coughing exercises.  Today he denies chest pain, shortness of breath, lower extremity edema, fatigue, palpitations, melena, hematuria, hemoptysis, diaphoresis, weakness, presyncope, syncope, orthopnea, and PND.     Home Medications    Prior to Admission medications   Medication Sig Start Date End Date Taking? Authorizing Provider  amoxicillin-clavulanate (AUGMENTIN) 875-125 MG tablet Take 1 tablet by mouth 2 (two) times daily for 6 days. 04/03/21 04/09/21  Manuella Ghazi, Pratik D, DO  aspirin  EC 81 MG EC tablet Take 1 tablet (81 mg total) by mouth daily. 05/21/16   Barrett, Evelene Croon, PA-C  b complex vitamins tablet Take 1 tablet by mouth daily.    [provider]  carvedilol (COREG) 3.125 MG tablet Take 0.5 tablets (1.5625 mg total) by mouth 2 (two) times daily with a meal. 03/18/21   Noreene Larsson, NP  Cholecalciferol (VITAMIN D3) 5000 units TABS Take 5,000 Units by mouth 2 (two) times daily.     [provider]  clopidogrel (PLAVIX) 75 MG tablet TAKE ONE (1) TABLET BY MOUTH EVERY DAY 03/16/21   Fayrene Helper, MD  Continuous Blood Gluc Receiver (FREESTYLE LIBRE 14 DAY READER) DEVI  12/07/19   [provider]  Continuous Blood Gluc Sensor (FREESTYLE LIBRE 14 DAY SENSOR) MISC Inject 1 each into the skin every 14 (fourteen) days. 01/22/21   Noreene Larsson, NP  Cyanocobalamin 1500 MCG TBDP Take 1,500 mcg by mouth daily.     [provider]  DULoxetine (CYMBALTA) 60 MG capsule Take 1 capsule (60 mg total) by mouth daily. 09/09/20   Fayrene Helper, MD  empagliflozin (JARDIANCE) 25 MG TABS tablet Take 1 tablet (25 mg total) by mouth daily before breakfast. 09/09/20   Fayrene Helper, MD  furosemide (LASIX) 40 MG tablet Take 1 tablet (40 mg total) by mouth daily. 02/19/21   Johnson, Clanford L, MD  guaiFENesin-dextromethorphan (ROBITUSSIN DM) 100-10 MG/5ML syrup Take 10 mLs by mouth every 4 (four) hours as needed for cough. 04/03/21   Manuella Ghazi, Pratik D, DO  levothyroxine (SYNTHROID) 88 MCG tablet Take 1 tablet (88 mcg total) by mouth daily. 01/27/21   Noreene Larsson, NP  linagliptin (TRADJENTA) 5 MG TABS tablet Take 1 tablet (5 mg total) by mouth daily. 02/17/21   Murlean Iba, MD  Multiple Vitamin (MULTIVITAMIN WITH MINERALS) TABS tablet Take 1 tablet by mouth daily.    [provider]  nitroGLYCERIN (NITROSTAT) 0.4 MG SL tablet Place 1 tablet (0.4 mg total) under the tongue every 5 (five) minutes as needed for chest pain. 05/22/16   Barrett,  Evelene Croon, PA-C  pantoprazole (PROTONIX) 40 MG tablet TAKE ONE (1) TABLET BY MOUTH EVERY DAY 04/10/20   Erenest Rasher, PA-C  potassium chloride (KLOR-CON) 10 MEQ tablet TAKE ONE TABLET (10MEQ TOTAL) BY MOUTH DAILY 03/16/21   [name], MD  pramipexole (MIRAPEX) 0.25 MG tablet Take 2 tablets (0.5 mg total) by mouth 2 (two) times daily. Take 0.5mg  by mouth at 7 PM and 0.5mg  at 9 PM daily. 02/25/20   Ward Givens, NP  rosuvastatin (CRESTOR) 20 MG tablet  Take 1 tablet (20 mg total) by mouth daily. 09/09/20   Fayrene Helper, MD  UNABLE TO [place] health supplies for CPAP 09/30/20   Perlie Mayo, NP  UNABLE TO FIND [place] sock aid to help with support hose. Size XL.  Dx:I73.9, G25.81, R60.9 01/14/21   Noreene Larsson, NP    Family History    Family History  Problem Relation Age of Onset   Heart disease Mother    Hypertension Mother    Heart attack Mother    Hypertension Father    Diabetes Father    Diabetes Son    Heart disease Son    Hypertension Son    Colon cancer Neg Hx    He indicated that his mother is deceased. He indicated that his father is deceased. He indicated that the status of his son is unknown. He indicated that the status of his neg hx is unknown.  Social History    Social History   Socioeconomic History   Marital status: Married    Spouse name: [name]    Number of children: 4   Years of education: college   Highest education level: Not on file  Occupational History   Occupation: Firefighter   Tobacco Use   Smoking status: Former    Types: Pipe    Quit date: 07/08/1977    Years since quitting: 43.7   Smokeless tobacco: Never  Vaping Use   Vaping Use: Never used  Substance and Sexual Activity   Alcohol use: No    Alcohol/week: 0.0 standard drinks   Drug use: No   Sexual activity: Not on file  Other Topics Concern   Not on file  Social History Narrative   Retired from [employer] social research officer -[employer]      Lives with [name]    Cat: Jazz       Enjoy: sleeping a lot       Diet: eats all food groups -chicken, steak-chopped, mostly veggie   Caffeine: coffee and soda "a lot"   Water: 3-4 cups daily      Wears seat belt   Does not use phone while driving    Oceanographer at home    weapons at home        Social Determinants of Health   Financial Resource Strain: Not on file  Food Insecurity: No Food Insecurity   Worried About Charity fundraiser in the Last Year: Never true   Arboriculturist in the Last Year: Never true  Transportation Needs: No Transportation Needs   Lack of Transportation (Medical): No   Lack of Transportation (Non-Medical): No  Physical Activity: Not on file  Stress: Not on file  Social Connections: Moderately Isolated   Frequency of Communication with Friends and Family: Three times a week   Frequency of Social Gatherings with Friends and Family: Three times a week   Attends Religious Services: Never   Active Member of Clubs or Organizations: No   Attends Archivist Meetings: Never   Marital Status: Married  Human resources officer Violence: Not on file     Review of Systems    General:  No chills, fever, night sweats or weight changes.  Cardiovascular:  No chest pain, dyspnea on exertion, edema, orthopnea, palpitations, paroxysmal nocturnal dyspnea. Dermatological: No rash, lesions/masses Respiratory: No cough, dyspnea Urologic: No hematuria, dysuria Abdominal:   No nausea, vomiting, diarrhea, bright red blood per rectum, melena, or hematemesis Neurologic:  No visual changes, wkns,  changes in mental status. All other systems reviewed and are otherwise negative except as noted above.  Physical Exam    VS:  BP (!) 120/56 (BP Location: Left Arm)   Pulse 76   Ht 5\' 11"  (1.803 m)   Wt 169 lb 9.6 oz (76.9 kg)   SpO2 99%   BMI 23.65 kg/m  , BMI Body mass index is 23.65 kg/m. GEN: Well nourished, well developed, in no acute distress. HEENT: normal. Neck: Supple, no JVD, carotid  bruits, or masses. Cardiac: RRR, no murmurs, rubs, or gallops. No clubbing, cyanosis, edema.  Radials/DP/PT 2+ and equal bilaterally.  Respiratory:  Respirations regular and unlabored, diminished to auscultation bilateral lower lobes. GI: Soft, nontender, nondistended, BS + x 4. MS: no deformity or atrophy. Skin: warm and dry, no rash. Neuro:  Strength and sensation are intact. Psych: Normal affect.  Accessory Clinical Findings    Recent Labs: 02/17/2021: Magnesium 1.8; TSH 2.445 04/02/2021: ALT 13 04/03/2021: BUN 12; Creatinine, Ser 0.62; Hemoglobin 9.8; Platelets 218; Potassium 4.0; Sodium 137   Recent Lipid Panel    Component Value Date/Time   CHOL 149 01/26/2021 0825   TRIG 78 01/26/2021 0825   HDL 46 01/26/2021 0825   CHOLHDL 2.8 09/25/2020 0859   CHOLHDL 3.9 11/26/2014 0930   VLDL 25 11/26/2014 0930   LDLCALC 88 01/26/2021 0825    ECG personally reviewed by me today-none today.  Lower extremity ABI 08/26/2020 Summary:  Right: Minimal heterogeneous plaque in the right lower extremity. No  evidence of stenosis in the common femoral, superficial femoral or  popliteal arteries.  30-49% stenosis noted in the posterior tibial artery, s/p angioplasty. No  significant change when compared to previous exam.      See table(s) above for measurements and observations.  See ABI report.   Suggest follow up study in 12 months.    Electronically signed by Ena Dawley MD on 08/26/2020 at 7:02:54 PM.   Echocardiogram 02/16/2021 IMPRESSIONS     1. Left ventricular ejection fraction, by estimation, is 55 to 60%. The  left ventricle has normal function. The left ventricle has no regional  wall motion abnormalities. There is mild left ventricular hypertrophy.  Left ventricular diastolic parameters  are consistent with Grade I diastolic dysfunction (impaired relaxation).   2. Right ventricular systolic function is normal. The right ventricular  size is normal.   3. Left atrial  size was mildly dilated.   4. The mitral valve is normal in structure. No evidence of mitral valve  regurgitation. No evidence of mitral stenosis.   5. The aortic valve is tricuspid. Aortic valve regurgitation is mild. No  aortic stenosis is present.   6. The inferior vena cava is normal in size with greater than 50%  respiratory variability, suggesting right atrial pressure of 3 mmHg.  Cardiac catheterization 05/19/2016 Mid RCA to Dist RCA lesion, 50 %stenosed. Dist RCA lesion, 45 %stenosed. RPDA lesion, 50 %stenosed. Dist LAD-2 lesion, 50 %stenosed. Dist LAD-1 lesion, 80 %stenosed. Mid LAD lesion, 50 %stenosed. Prox LAD lesion, 40 %stenosed. 1st Mrg lesion, 85 %stenosed. Hemodynamic findings consistent with moderate pulmonary hypertension.   Diffuse moderate to severe LAD disease. The most severe region is in the mid to distal vessel and is somewhat eccentric with up to 80% obstruction. Widely patent RCA and circumflex. Elevated left ventricular filling pressures with LVEDP of 32 and pulmonary capillary wedge pressure of 18 mmHg. The procedure was very difficult due to significant tortuosity in the right subclavian/innominate artery ascending  aortic junction. This prevented control of catheter movement.   RECOMMENDATIONS:   Start Plavix if no contraindication Optimize medical therapy for both heart failure and myocardial ischemia. If symptomatic angina on medical therapy, consider femoral approach to treat the mid to distal LAD disease with stenting.   Diagnostic Dominance: Co-dominant    Intervention   Assessment & Plan   1.  Essential hypertension-BP today 120/56.  Continues to monitor home.  Did note some low blood pressures with his smart watch.  When calibrated against office blood pressure cuff his blood pressure/smart watch was reading about 8 points lower.  I recommended he use a more accurate upper arm style cuff. Continue carvedilol Heart healthy low-sodium  diet-salty 6 given Increase physical activity as tolerated Maintain blood pressure log  Cardiomyopathy-no increased DOE or activity intolerance.  Echocardiogram 5/22 showed normal LVEF and G1 DD. Continue carvedilol Heart healthy low-sodium diet-salty 6 given Increase physical activity as tolerated  Hyperlipidemia-01/26/2021: Cholesterol, Total 149; HDL 46; LDL Chol Calc (NIH) 88; Triglycerides 78 Continue rosuvastatin Heart healthy low-sodium high-fiber diet Increase physical activity as tolerated  Peripheral arterial disease-denies claudication.  ABIs 08/26/2020 showed minimal right lower extremity plaque 30-49% stenosis of the posterior tibial artery status postangioplasty with no significant changes since prior exam. Continue aspirin, Plavix, rosuvastatin Heart healthy low-sodium high-fiber diet Repeat ABIs 12/22  Bilateral carotid artery disease-underwent carotid endarterectomy by Dr. [name].  Carotid Dopplers 10/17 showed widely patent ICA Continue aspirin, Plavix, rosuvastatin Follows with Dr. [name]  Obstructive sleep apnea-reports intermittent compliance with CPAP.  We discussed the importance of keeping his device/machine clean and using it regularly.  Patient recently admitted 7/14 - 04/03/2021 with pneumonia.  He reports he is compliant and is finishing his antibiotics. Continue CPAP use  Disposition: Follow-up with Dr. Gwenlyn Found after ABIs  Jossie Ng. Rosealee Recinos NP-C    04/08/2021, 10:51 AM [place] [address] Office [phone] Fax [phone]  Notice: This dictation was prepared with Dragon dictation along with smaller phrase technology. Any transcriptional errors that result from this process are unintentional and may not be corrected upon review.  I spent 14 minutes examining this patient, reviewing medications, and using patient centered shared decision making involving her cardiac care.  Prior to her visit I spent greater  than 20 minutes reviewing her past medical history,  medications, and prior cardiac tests.

## 2021-04-07 NOTE — Progress Notes (Signed)
Last urine culture grew multiple species. Can he give [place] a new sample if he is having symptoms?

## 2021-04-08 ENCOUNTER — Telehealth: Payer: Self-pay | Admitting: Nurse Practitioner

## 2021-04-08 ENCOUNTER — Other Ambulatory Visit: Payer: Self-pay

## 2021-04-08 ENCOUNTER — Ambulatory Visit (INDEPENDENT_AMBULATORY_CARE_PROVIDER_SITE_OTHER): Payer: HMO | Admitting: General Practice

## 2021-04-08 ENCOUNTER — Encounter: Payer: Self-pay | Admitting: General Practice

## 2021-04-08 ENCOUNTER — Ambulatory Visit (HOSPITAL_COMMUNITY): Payer: HMO

## 2021-04-08 ENCOUNTER — Encounter (HOSPITAL_COMMUNITY): Payer: Self-pay

## 2021-04-08 VITALS — BP 120/56 | HR 76 | Ht 71.0 in | Wt 169.6 lb

## 2021-04-08 DIAGNOSIS — R262 Difficulty in walking, not elsewhere classified: Secondary | ICD-10-CM | POA: Diagnosis not present

## 2021-04-08 DIAGNOSIS — E782 Mixed hyperlipidemia: Secondary | ICD-10-CM | POA: Diagnosis not present

## 2021-04-08 DIAGNOSIS — I6523 Occlusion and stenosis of bilateral carotid arteries: Secondary | ICD-10-CM

## 2021-04-08 DIAGNOSIS — I429 Cardiomyopathy, unspecified: Secondary | ICD-10-CM | POA: Diagnosis not present

## 2021-04-08 DIAGNOSIS — G4733 Obstructive sleep apnea (adult) (pediatric): Secondary | ICD-10-CM

## 2021-04-08 DIAGNOSIS — I10 Essential (primary) hypertension: Secondary | ICD-10-CM | POA: Diagnosis not present

## 2021-04-08 DIAGNOSIS — M6281 Muscle weakness (generalized): Secondary | ICD-10-CM

## 2021-04-08 DIAGNOSIS — I739 Peripheral vascular disease, unspecified: Secondary | ICD-10-CM

## 2021-04-08 NOTE — Therapy (Signed)
[facility] [address] [place], [place], [address] Phone: [phone]   Fax:  [phone]  Physical Therapy Treatment  Patient Details  Name: [name] MRN: [id number] Date of Birth: Oct 21, 1945 Referring Provider (PT): Noreene Larsson, NP   Encounter Date: 04/08/2021   PT End of Session - 04/08/21 1648     Visit Number 2    Number of Visits 12    Date for PT Re-Evaluation 05/18/21    Authorization Type [employer], no VL, no auth ,    Progress Note Due on Visit 10    PT Start Time 1619    PT Stop Time 1702    PT Time Calculation (min) 43 min    Equipment Utilized During Treatment Gait belt    Activity Tolerance Patient tolerated treatment well    Behavior During Therapy [facility] for tasks assessed/performed             Past Medical History:  Diagnosis Date   Aftercare following surgery of the circulatory system, NEC 12/04/2013   Angina decubitus ([place]) 05/19/2016   Arrhythmia 05/20/2016   Arthritis    ARTHRITIS, RIGHT FOOT 06/26/2008   Qualifier: Diagnosis of  By: Aline Brochure MD, [name]     Cardiomyopathy- EF NL 01/2013, now 30-35% echo 03/14/2012   Carotid artery occlusion    left s/p CEA   Cellulitis of left foot 05/07/2018   CHF (congestive heart failure) ([place])    Critical lower limb ischemia ([place]) 02/28/2014   Critical limb ischemia    Diabetes mellitus    Elevated troponin 04/11/2016   GERD (gastroesophageal reflux disease)    History of stroke June 2013 03/14/2012   HOH (hard of hearing)    Hypertension    Hypothyroidism    Nonhealing skin ulcer ([place]) 10/28/2014   Obstructive sleep apnea    Orthostatic hypotension    Osteomyelitis ([place])    left great toe   Pain in the chest 05/17/2016   Peripheral arterial disease ([place]), s/p PTA x 2 RLE    nonhealing ulcers bilaterally on each great toe   Pneumonia    PONV (postoperative nausea and vomiting)    Pulmonary nodule 05/17/2016   Restless leg syndrome     Shortness of breath    Stroke [facility]) March 08, 2012   Toe osteomyelitis, left [facility])    Ulcer of great toe, left, with necrosis of bone ([place])    Wears dentures     Past Surgical History:  Procedure Laterality Date   AMPUTATION TOE Left 07/25/2018   Procedure: AMPUTATION TOE INTERPHALANGEAL HALLUX LEFT;  Surgeon: Evelina Bucy, DPM;  Location: [place];  Service: Podiatry;  Laterality: Left;   ANGIOPLASTY  02/28/14   diamond back orbital rotational atherectomy of Rt. tibial   BACK SURGERY     BONE BIOPSY Left 07/25/2018   Procedure: SUPERFICIAL BONE BIOPSY;  Surgeon: Evelina Bucy, DPM;  Location: [place];  Service: Podiatry;  Laterality: Left;   CARDIAC CATHETERIZATION N/A 05/19/2016   Procedure: Right/Left Heart Cath and Coronary Angiography;  Surgeon: Belva Crome, MD;  Location: [place] CV LAB;  Service: Cardiovascular;  Laterality: N/A;   CERVICAL FUSION     ENDARTERECTOMY Left 11/29/2013   Procedure: ENDARTERECTOMY CAROTID;  Surgeon: Serafina Mitchell, MD;  Location: [place];  Service: Vascular;  Laterality: Left;   ESOPHAGOGASTRODUODENOSCOPY  02/2010   Dr. Gala Romney: patient presented with food impaction, schatzki ring with superimposed component of stricture with erosive  reflux esophagitis, s/p disimpaction but dilation planned at later date    ESOPHAGOGASTRODUODENOSCOPY (EGD) WITH PROPOFOL N/A 08/13/2019   Dr. Gala Romney: Erosive reflux esophagitis with mild stricture and incidental Mallory-Weiss tear which precluded esophageal dilation.  Medium sized hiatal hernia.   ESOPHAGOGASTRODUODENOSCOPY (EGD) WITH PROPOFOL N/A 10/11/2019   Dr. Gala Romney: Esophageal stenosis status post dilation, moderate hiatal hernia   EYE SURGERY     FLEXIBLE SIGMOIDOSCOPY N/A 08/13/2019   Procedure: FLEXIBLE SIGMOIDOSCOPY;  Surgeon: [name], MD;  [facility];  Service: Endoscopy;  Laterality: N/A;  colonoscopy aboerted due to formed stool and poor prep   FOOT SURGERY     KNEE ARTHROSCOPY WITH MEDIAL  MENISECTOMY Left 05/06/2020   Procedure: KNEE ARTHROSCOPY WITH MEDIAL MENISCECTOMY AND LATERAL MENISCECTOMY;  Surgeon: [name], MD;  [facility];  Service: Orthopedics;  Laterality: Left;   Lower ext duplex doppler  03/14/14   Rt ABI 1.2   LOWER EXTREMITY ANGIOGRAM Bilateral 02/18/2014   Procedure: LOWER EXTREMITY ANGIOGRAM;  Surgeon: [name], MD;  Location: [facility] CATH LAB;  Service: Cardiovascular;  Laterality: Bilateral;   LOWER EXTREMITY ANGIOGRAM N/A 10/31/2014   Procedure: LOWER EXTREMITY ANGIOGRAM;  Surgeon: [name], MD;  Location: [facility] CATH LAB;  Service: Cardiovascular;  Laterality: N/A;   MALONEY DILATION N/A 10/11/2019   Procedure: Venia Minks DILATION;  Surgeon: [name], MD;  [facility];  Service: Endoscopy;  Laterality: N/A;   MULTIPLE TOOTH EXTRACTIONS     PV angiogram  02/18/2014   tibial vessel diseas bil.   SPINE SURGERY     tendon achillies lengthing and sesamoid      There were no vitals filed for this visit.   Subjective Assessment - 04/08/21 1630     Patient Stated Goals be able to walk normally and function correctly.                               [facility] Adult PT Treatment/Exercise - 04/08/21 0001       Knee/Hip Exercises: Stretches   Active Hamstring Stretch Both;3 reps;60 seconds    Active Hamstring Stretch Limitations supine wiht rope      Knee/Hip Exercises: Seated   Long Arc Quad 10 reps    Long Arc Quad Limitations 3sets      Knee/Hip Exercises: Supine   Quad Sets Left;2 sets;10 reps    Bridges 3 sets;10 reps    Bridges Limitations 5" holds    Other Supine Knee/Hip Exercises abduciton with towel underneath                    PT Education - 04/08/21 1632     Education Details Reviewed goals, reviewed importance of HEP compliance for maximal benefits.  Discussion held with importance of wearing supportive shoes and importance of reducing shuffling during gait for fall prevention.     Person(s) Educated Patient    Methods Explanation;Demonstration    Comprehension Verbalized understanding;Returned demonstration              PT Short Term Goals - 04/06/21 1026       PT SHORT TERM GOAL #1   Title Patient will be independent in self management strategies to improve quality of life and functional outcomes.    Time 3    Period Weeks    Status New    Target Date 04/27/21      PT SHORT  TERM GOAL #2   Title Patient will be able to report at least 25% improvement in overall mobility.    Time 3    Period Weeks    Status New    Target Date 04/27/21      PT SHORT TERM GOAL #3   Title Demo improved balance and BLE strength as evidenced by time of 18 sec 5xSTS test    Baseline 26 sec with BUE support    Time 3    Period Weeks    Status New    Target Date 04/27/21               PT Long Term Goals - 04/06/21 1029       PT LONG TERM GOAL #1   Title Patient will improve FOTO score by at least 10 points in order to indicate improved tolerance to activity.    Baseline 50% function    Time 6    Period Weeks    Status New    Target Date 05/18/21      PT LONG TERM GOAL #2   Title Demo improved gait velocity and safety as evidenced by distance of 150 ft during 2MWT    Baseline 60 ft with rollator    Time 6    Period Weeks    Status New    Target Date 05/18/21                   Plan - 04/08/21 1649     Clinical Impression Statement Began session reviewing goals, educated importance of compliance wiht HEP for maximal benefits, pt able to recall current exercise program.  Session focus with LE strengthening and mobility to improve knee and hip extension.  Pt with shuffled gait and wearing slippers into dept.  Reminded importance of wearing supportive shoes as there is a hole in sole and encouraged to return to diabetic shoes for relief on wound.  Gait training to improve foot clearance for fall prevention.    Personal Factors and Comorbidities  Comorbidity 1;Comorbidity 3+;Comorbidity 2    Comorbidities CHF, left knee surgery, right chronic foot wound, DB, restless leg syndromes    Examination-Activity Limitations Bathing;Lift;Locomotion Level;Transfers;Stand;Stairs;Squat;Reach Overhead;Carry    Examination-Participation Restrictions Community Activity;Meal Prep    Stability/Clinical Decision Making Evolving/Moderate complexity    Clinical Decision Making Moderate    Rehab Potential Fair    PT Frequency 2x / week    PT Duration 6 weeks    PT Treatment/Interventions ADLs/Self Care Home Management;Other (comment);Moist Heat;Balance training;Therapeutic exercise;Therapeutic activities;Functional mobility training;Stair training;Gait training;DME Instruction;Ultrasound;Neuromuscular re-education;Patient/family education;Orthotic Fit/Training;Manual techniques;Taping;Passive range of motion;Electrical Stimulation    PT Next Visit Plan Continue with BLE strength, balance    PT Home Exercise Plan hip abd/add isometric, QS, hamstring stretch    Consulted and Agree with Plan of Care Patient             Patient will benefit from skilled therapeutic intervention in order to improve the following deficits and impairments:  Abnormal gait, Decreased endurance, Decreased skin integrity, Increased edema, Decreased activity tolerance, Decreased balance, Decreased mobility, Decreased knowledge of use of DME, Decreased strength, Difficulty walking, Pain, Decreased range of motion, Impaired flexibility, Postural dysfunction, Improper body mechanics  Visit Diagnosis: Difficulty in walking, not elsewhere classified  Muscle weakness (generalized)     Problem List Patient Active Problem List   Diagnosis Date Noted   Pneumonia 04/03/2021   PNA (pneumonia) 04/02/2021   Abnormal finding on urinalysis 04/01/2021   Syncope  02/16/2021   Minor head injury    Right hip pain 02/11/2021   Erectile dysfunction 01/22/2021   Gait abnormality 09/30/2020    Mild non proliferative diabetic retinopathy ([place]) 07/16/2020   Need for immunization against influenza 06/03/2020   S/P left knee arthroscopy 05/06/20 05/13/2020   Abnormal MRI 03/26/2020   Joint disorder of knee 03/26/2020   Fall at home, initial encounter 03/13/2020   Anemia 09/03/2019   Reflux esophagitis 09/03/2019   Esophageal dysphagia 05/30/2019   Constipation 05/30/2019   Positive colorectal cancer screening using Cologuard test 05/30/2019   Diabetic foot ulcer ([place]) 09/06/2018   Orthostasis 08/25/2016   Congestive heart failure ([place]) 05/17/2016   Bilateral carotid artery disease ([place]) 07/08/2014   Obstructive sleep apnea 02/12/2014   Restless legs 10/23/2013   Overweight with body mass index (BMI) of 29 to 29.9 in adult 10/23/2013   Hyperlipidemia 07/16/2013   Peripheral arterial disease ([place]) 07/16/2013   Hypertension 03/14/2012   Type 2 diabetes mellitus with circulatory disorder ([place]) 03/14/2012   Peripheral neuropathy 03/14/2012   Cardiomyopathy- EF NL 01/2013, now 30-35% echo 03/14/2012   [name], LPTA/CLT; CBIS [phone]  [name] 04/08/2021, 6:00 PM  [facility] [address] [place], [place], [address] Phone: [phone]   Fax:  [phone]  Name: [name] MRN: [id number] Date of Birth: 04/16/1946

## 2021-04-08 NOTE — Patient Instructions (Signed)
Medication Instructions:  The current medical regimen is effective;  continue present plan and medications as directed. Please refer to the Current Medication list given to you today.  *If you need a refill on your cardiac medications before your next appointment, please call your pharmacy*  Lab Work:  Testing/Procedures: NONE.   SCHEDULE ABI's AND CAROTID US  Special Instructions  TAKE AND LOG YOU BLOOD PRESSURE  Follow-Up: Your next appointment:  AFTER CAROTID IN [place]  In Person with Quay Burow, MD Please call our office 2 months in advance to schedule this appointment   At [facility], you and your health needs are our priority.  As part of our continuing mission to provide you with exceptional heart care, we have created designated Provider Care Teams.  These Care Teams include your primary Cardiologist (physician) and Advanced Practice Providers (APPs -  Physician Assistants and Nurse Practitioners) who all work together to provide you with the care you need, when you need it.

## 2021-04-08 NOTE — Telephone Encounter (Signed)
I called pt back. See other note.

## 2021-04-08 NOTE — Telephone Encounter (Signed)
[place] patient called and said he is returning your call.   Please call him back   Thanks,

## 2021-04-09 ENCOUNTER — Other Ambulatory Visit: Payer: Self-pay

## 2021-04-09 DIAGNOSIS — R300 Dysuria: Secondary | ICD-10-CM

## 2021-04-09 NOTE — Progress Notes (Signed)
Noted. Pt dropped off sample for new culture.

## 2021-04-11 LAB — URINE CULTURE: Organism ID, Bacteria: NO GROWTH

## 2021-04-13 ENCOUNTER — Ambulatory Visit (HOSPITAL_COMMUNITY): Payer: HMO | Admitting: Physical Therapy

## 2021-04-13 ENCOUNTER — Other Ambulatory Visit: Payer: Self-pay

## 2021-04-13 ENCOUNTER — Encounter (HOSPITAL_COMMUNITY): Payer: HMO

## 2021-04-13 DIAGNOSIS — R262 Difficulty in walking, not elsewhere classified: Secondary | ICD-10-CM

## 2021-04-13 DIAGNOSIS — M6281 Muscle weakness (generalized): Secondary | ICD-10-CM

## 2021-04-13 NOTE — Telephone Encounter (Signed)
Has appt for [facility] 7/27

## 2021-04-13 NOTE — Therapy (Signed)
[facility] [address] [place], [place], [address] Phone: [phone]   Fax:  [phone]  Physical Therapy Treatment  Patient Details  Name: [name] MRN: [id number] Date of Birth: Dec 11, 1945 Referring Provider (PT): Noreene Larsson, NP   Encounter Date: 04/13/2021   PT End of Session - 04/13/21 1434     Visit Number 3    Number of Visits 12    Date for PT Re-Evaluation 05/18/21    Authorization Type [employer], no VL, no auth ,    Progress Note Due on Visit 10    PT Start Time [id number]    PT Stop Time 1528    PT Time Calculation (min) 40 min    Equipment Utilized During Treatment Gait belt    Activity Tolerance Patient tolerated treatment well    Behavior During Therapy [facility] for tasks assessed/performed             Past Medical History:  Diagnosis Date   Aftercare following surgery of the circulatory system, NEC 12/04/2013   Angina decubitus ([place]) 05/19/2016   Arrhythmia 05/20/2016   Arthritis    ARTHRITIS, RIGHT FOOT 06/26/2008   Qualifier: Diagnosis of  By: Aline Brochure MD, [name]     Cardiomyopathy- EF NL 01/2013, now 30-35% echo 03/14/2012   Carotid artery occlusion    left s/p CEA   Cellulitis of left foot 05/07/2018   CHF (congestive heart failure) ([place])    Critical lower limb ischemia ([place]) 02/28/2014   Critical limb ischemia    Diabetes mellitus    Elevated troponin 04/11/2016   GERD (gastroesophageal reflux disease)    History of stroke June 2013 03/14/2012   HOH (hard of hearing)    Hypertension    Hypothyroidism    Nonhealing skin ulcer ([place]) 10/28/2014   Obstructive sleep apnea    Orthostatic hypotension    Osteomyelitis ([place])    left great toe   Pain in the chest 05/17/2016   Peripheral arterial disease ([place]), s/p PTA x 2 RLE    nonhealing ulcers bilaterally on each great toe   Pneumonia    PONV (postoperative nausea and vomiting)    Pulmonary nodule 05/17/2016   Restless leg syndrome     Shortness of breath    Stroke [facility]) March 08, 2012   Toe osteomyelitis, left [facility])    Ulcer of great toe, left, with necrosis of bone ([place])    Wears dentures     Past Surgical History:  Procedure Laterality Date   AMPUTATION TOE Left 07/25/2018   Procedure: AMPUTATION TOE INTERPHALANGEAL HALLUX LEFT;  Surgeon: Evelina Bucy, DPM;  Location: [place];  Service: Podiatry;  Laterality: Left;   ANGIOPLASTY  02/28/14   diamond back orbital rotational atherectomy of Rt. tibial   BACK SURGERY     BONE BIOPSY Left 07/25/2018   Procedure: SUPERFICIAL BONE BIOPSY;  Surgeon: Evelina Bucy, DPM;  Location: [place];  Service: Podiatry;  Laterality: Left;   CARDIAC CATHETERIZATION N/A 05/19/2016   Procedure: Right/Left Heart Cath and Coronary Angiography;  Surgeon: Belva Crome, MD;  Location: [place] CV LAB;  Service: Cardiovascular;  Laterality: N/A;   CERVICAL FUSION     ENDARTERECTOMY Left 11/29/2013   Procedure: ENDARTERECTOMY CAROTID;  Surgeon: Serafina Mitchell, MD;  Location: [place];  Service: Vascular;  Laterality: Left;   ESOPHAGOGASTRODUODENOSCOPY  02/2010   Dr. Gala Romney: patient presented with food impaction, schatzki ring with superimposed component of stricture with erosive  reflux esophagitis, s/p disimpaction but dilation planned at later date    ESOPHAGOGASTRODUODENOSCOPY (EGD) WITH PROPOFOL N/A 08/13/2019   Dr. Gala Romney: Erosive reflux esophagitis with mild stricture and incidental Mallory-Weiss tear which precluded esophageal dilation.  Medium sized hiatal hernia.   ESOPHAGOGASTRODUODENOSCOPY (EGD) WITH PROPOFOL N/A 10/11/2019   Dr. Gala Romney: Esophageal stenosis status post dilation, moderate hiatal hernia   EYE SURGERY     FLEXIBLE SIGMOIDOSCOPY N/A 08/13/2019   Procedure: FLEXIBLE SIGMOIDOSCOPY;  Surgeon: [name], MD;  [facility];  Service: Endoscopy;  Laterality: N/A;  colonoscopy aboerted due to formed stool and poor prep   FOOT SURGERY     KNEE ARTHROSCOPY WITH MEDIAL  MENISECTOMY Left 05/06/2020   Procedure: KNEE ARTHROSCOPY WITH MEDIAL MENISCECTOMY AND LATERAL MENISCECTOMY;  Surgeon: [name], MD;  Location[facility];  Service: Orthopedics;  Laterality: Left;   Lower ext duplex doppler  03/14/14   Rt ABI 1.2   LOWER EXTREMITY ANGIOGRAM Bilateral 02/18/2014   Procedure: LOWER EXTREMITY ANGIOGRAM;  Surgeon: [name], MD;  Location: [facility] CATH LAB;  Service: Cardiovascular;  Laterality: Bilateral;   LOWER EXTREMITY ANGIOGRAM N/A 10/31/2014   Procedure: LOWER EXTREMITY ANGIOGRAM;  Surgeon: [name], MD;  Location: [facility] CATH LAB;  Service: Cardiovascular;  Laterality: N/A;   MALONEY DILATION N/A 10/11/2019   Procedure: Venia Minks DILATION;  Surgeon: [name], MD;  [facility];  Service: Endoscopy;  Laterality: N/A;   MULTIPLE TOOTH EXTRACTIONS     PV angiogram  02/18/2014   tibial vessel diseas bil.   SPINE SURGERY     tendon achillies lengthing and sesamoid      There were no vitals filed for this visit.   Subjective Assessment - 04/13/21 1526     Subjective Pt reports no pain or issues.  No falls in the last 5 days.  Reports  compliance with HEP. States he is anxious to begin OT on his Lt shoulder but has not heard of evaluation yet (no order has been sent).    Currently in Pain? No/denies                               [facility] Adult PT Treatment/Exercise - 04/13/21 0001       Knee/Hip Exercises: Seated   Long Arc Quad --    Long Arc Quad Limitations --    Sit to [employer] 2 sets;10 reps;without UE support;Other (comment)   cues to activate glutes and push up through bottom     Knee/Hip Exercises: Supine   Bridges 3 sets;10 reps    Bridges Limitations 5" holds    Straight Leg Raises Both;10 reps;2 sets      Knee/Hip Exercises: Sidelying   Clams 10x 5" 2 sets      Knee/Hip Exercises: Prone   Hip Extension Both;10 reps;2 sets    Hip Extension Limitations with knee bent    Other Prone Exercises  heelsqueezes 10X5" holds                      PT Short Term Goals - 04/06/21 1026       PT SHORT TERM GOAL #1   Title Patient will be independent in self management strategies to improve quality of life and functional outcomes.    Time 3    Period Weeks    Status New    Target Date 04/27/21  PT SHORT TERM GOAL #2   Title Patient will be able to report at least 25% improvement in overall mobility.    Time 3    Period Weeks    Status New    Target Date 04/27/21      PT SHORT TERM GOAL #3   Title Demo improved balance and BLE strength as evidenced by time of 18 sec 5xSTS test    Baseline 26 sec with BUE support    Time 3    Period Weeks    Status New    Target Date 04/27/21               PT Long Term Goals - 04/06/21 1029       PT LONG TERM GOAL #1   Title Patient will improve FOTO score by at least 10 points in order to indicate improved tolerance to activity.    Baseline 50% function    Time 6    Period Weeks    Status New    Target Date 05/18/21      PT LONG TERM GOAL #2   Title Demo improved gait velocity and safety as evidenced by distance of 150 ft during 2MWT    Baseline 60 ft with rollator    Time 6    Period Weeks    Status New    Target Date 05/18/21                   Plan - 04/13/21 1511     Clinical Impression Statement Pt comes today wearing croc type shoes without insoles and reports he still has the wound on the bottom of his foot; recommended returning to using his shoes with built in orthotics.  Pt verbalized understanding but reports those shoes are too heavy and feels like he may fall.  Continued with LE strengthening with addition of bridge and SLR.  Cues to keep LE straight rest breaks needed due to weakness.  Added sidelying clams with VC's to increase hold times and tactile cues to reduce substitution.  Prone position required increased time to assume with inability to complete hip extensions without knee bent due  to weakness.    Personal Factors and Comorbidities Comorbidity 1;Comorbidity 3+;Comorbidity 2    Comorbidities CHF, left knee surgery, right chronic foot wound, DB, restless leg syndromes    Examination-Activity Limitations Bathing;Lift;Locomotion Level;Transfers;Stand;Stairs;Squat;Reach Overhead;Carry    Examination-Participation Restrictions Community Activity;Meal Prep    Stability/Clinical Decision Making Evolving/Moderate complexity    Rehab Potential Fair    PT Frequency 2x / week    PT Duration 6 weeks    PT Treatment/Interventions ADLs/Self Care Home Management;Other (comment);Moist Heat;Balance training;Therapeutic exercise;Therapeutic activities;Functional mobility training;Stair training;Gait training;DME Instruction;Ultrasound;Neuromuscular re-education;Patient/family education;Orthotic Fit/Training;Manual techniques;Taping;Passive range of motion;Electrical Stimulation    PT Next Visit Plan Continue with BLE strength, balance    PT Home Exercise Plan hip abd/add isometric, QS, hamstring stretch    Consulted and Agree with Plan of Care Patient             Patient will benefit from skilled therapeutic intervention in order to improve the following deficits and impairments:  Abnormal gait, Decreased endurance, Decreased skin integrity, Increased edema, Decreased activity tolerance, Decreased balance, Decreased mobility, Decreased knowledge of use of DME, Decreased strength, Difficulty walking, Pain, Decreased range of motion, Impaired flexibility, Postural dysfunction, Improper body mechanics  Visit Diagnosis: Muscle weakness (generalized)  Difficulty in walking, not elsewhere classified     Problem List Patient Active Problem List  Diagnosis Date Noted   Pneumonia 04/03/2021   PNA (pneumonia) 04/02/2021   Abnormal finding on urinalysis 04/01/2021   Syncope 02/16/2021   Minor head injury    Right hip pain 02/11/2021   Erectile dysfunction 01/22/2021   Gait  abnormality 09/30/2020   Mild non proliferative diabetic retinopathy ([place]) 07/16/2020   Need for immunization against influenza 06/03/2020   S/P left knee arthroscopy 05/06/20 05/13/2020   Abnormal MRI 03/26/2020   Joint disorder of knee 03/26/2020   Fall at home, initial encounter 03/13/2020   Anemia 09/03/2019   Reflux esophagitis 09/03/2019   Esophageal dysphagia 05/30/2019   Constipation 05/30/2019   Positive colorectal cancer screening using Cologuard test 05/30/2019   Diabetic foot ulcer ([place]) 09/06/2018   Orthostasis 08/25/2016   Congestive heart failure ([place]) 05/17/2016   Bilateral carotid artery disease ([place]) 07/08/2014   Obstructive sleep apnea 02/12/2014   Restless legs 10/23/2013   Overweight with body mass index (BMI) of 29 to 29.9 in adult 10/23/2013   Hyperlipidemia 07/16/2013   Peripheral arterial disease ([place]) 07/16/2013   Hypertension 03/14/2012   Type 2 diabetes mellitus with circulatory disorder ([place]) 03/14/2012   Peripheral neuropathy 03/14/2012   Cardiomyopathy- EF NL 01/2013, now 30-35% echo 03/14/2012   Teena Irani, PTA/CLT [phone]  Teena Irani 04/13/2021, 3:27 PM  [facility] [address] [place], [place], [address] Phone: [phone]   Fax:  [phone]  Name: [name] MRN: [id number] Date of Birth: Dec 10, 1945

## 2021-04-14 ENCOUNTER — Telehealth: Payer: Self-pay

## 2021-04-14 NOTE — Telephone Encounter (Signed)
Patient called asked for nurse to please return his call [phone] about wound on foot.   Sent patient to hormone doctor and needs to check on this with the nurse.

## 2021-04-15 ENCOUNTER — Other Ambulatory Visit: Payer: Self-pay

## 2021-04-15 ENCOUNTER — Encounter (HOSPITAL_COMMUNITY): Payer: Self-pay

## 2021-04-15 ENCOUNTER — Ambulatory Visit (HOSPITAL_COMMUNITY): Payer: HMO

## 2021-04-15 ENCOUNTER — Ambulatory Visit: Payer: HMO | Admitting: Nurse Practitioner

## 2021-04-15 ENCOUNTER — Encounter (HOSPITAL_COMMUNITY): Payer: HMO | Admitting: Physical Therapy

## 2021-04-15 DIAGNOSIS — G2581 Restless legs syndrome: Secondary | ICD-10-CM | POA: Diagnosis not present

## 2021-04-15 DIAGNOSIS — M6281 Muscle weakness (generalized): Secondary | ICD-10-CM

## 2021-04-15 DIAGNOSIS — G253 Myoclonus: Secondary | ICD-10-CM | POA: Insufficient documentation

## 2021-04-15 DIAGNOSIS — R262 Difficulty in walking, not elsewhere classified: Secondary | ICD-10-CM

## 2021-04-15 DIAGNOSIS — Z79899 Other long term (current) drug therapy: Secondary | ICD-10-CM | POA: Diagnosis not present

## 2021-04-15 DIAGNOSIS — R296 Repeated falls: Secondary | ICD-10-CM | POA: Diagnosis not present

## 2021-04-15 NOTE — Therapy (Signed)
[facility] [address] [place], [place], [address] Phone: [phone]   Fax:  [phone]  Physical Therapy Treatment  Patient Details  Name: [name] MRN: [id number] Date of Birth: 1945-11-09 Referring Provider (PT): Noreene Larsson, NP   Encounter Date: 04/15/2021   PT End of Session - 04/15/21 1157     Visit Number 4    Number of Visits 12    Date for PT Re-Evaluation 05/18/21    Authorization Type [employer], no VL, no auth ,    Progress Note Due on Visit 10    PT Start Time [id number]    PT Stop Time 1225    PT Time Calculation (min) 44 min    Equipment Utilized During Treatment Gait belt    Activity Tolerance Patient tolerated treatment well    Behavior During Therapy [facility] for tasks assessed/performed             Past Medical History:  Diagnosis Date   Aftercare following surgery of the circulatory system, NEC 12/04/2013   Angina decubitus ([place]) 05/19/2016   Arrhythmia 05/20/2016   Arthritis    ARTHRITIS, RIGHT FOOT 06/26/2008   Qualifier: Diagnosis of  By: Aline Brochure MD, [name]     Cardiomyopathy- EF NL 01/2013, now 30-35% echo 03/14/2012   Carotid artery occlusion    left s/p CEA   Cellulitis of left foot 05/07/2018   CHF (congestive heart failure) ([place])    Critical lower limb ischemia ([place]) 02/28/2014   Critical limb ischemia    Diabetes mellitus    Elevated troponin 04/11/2016   GERD (gastroesophageal reflux disease)    History of stroke June 2013 03/14/2012   HOH (hard of hearing)    Hypertension    Hypothyroidism    Nonhealing skin ulcer ([place]) 10/28/2014   Obstructive sleep apnea    Orthostatic hypotension    Osteomyelitis ([place])    left great toe   Pain in the chest 05/17/2016   Peripheral arterial disease ([place]), s/p PTA x 2 RLE    nonhealing ulcers bilaterally on each great toe   Pneumonia    PONV (postoperative nausea and vomiting)    Pulmonary nodule 05/17/2016   Restless leg syndrome     Shortness of breath    Stroke [facility]) March 08, 2012   Toe osteomyelitis, left [facility])    Ulcer of great toe, left, with necrosis of bone ([place])    Wears dentures     Past Surgical History:  Procedure Laterality Date   AMPUTATION TOE Left 07/25/2018   Procedure: AMPUTATION TOE INTERPHALANGEAL HALLUX LEFT;  Surgeon: Evelina Bucy, DPM;  Location: [place];  Service: Podiatry;  Laterality: Left;   ANGIOPLASTY  02/28/14   diamond back orbital rotational atherectomy of Rt. tibial   BACK SURGERY     BONE BIOPSY Left 07/25/2018   Procedure: SUPERFICIAL BONE BIOPSY;  Surgeon: Evelina Bucy, DPM;  Location: [place];  Service: Podiatry;  Laterality: Left;   CARDIAC CATHETERIZATION N/A 05/19/2016   Procedure: Right/Left Heart Cath and Coronary Angiography;  Surgeon: Belva Crome, MD;  Location: [place] CV LAB;  Service: Cardiovascular;  Laterality: N/A;   CERVICAL FUSION     ENDARTERECTOMY Left 11/29/2013   Procedure: ENDARTERECTOMY CAROTID;  Surgeon: Serafina Mitchell, MD;  Location: [place];  Service: Vascular;  Laterality: Left;   ESOPHAGOGASTRODUODENOSCOPY  02/2010   Dr. Gala Romney: patient presented with food impaction, schatzki ring with superimposed component of stricture with erosive  reflux esophagitis, s/p disimpaction but dilation planned at later date    ESOPHAGOGASTRODUODENOSCOPY (EGD) WITH PROPOFOL N/A 08/13/2019   Dr. Gala Romney: Erosive reflux esophagitis with mild stricture and incidental Mallory-Weiss tear which precluded esophageal dilation.  Medium sized hiatal hernia.   ESOPHAGOGASTRODUODENOSCOPY (EGD) WITH PROPOFOL N/A 10/11/2019   Dr. Gala Romney: Esophageal stenosis status post dilation, moderate hiatal hernia   EYE SURGERY     FLEXIBLE SIGMOIDOSCOPY N/A 08/13/2019   Procedure: FLEXIBLE SIGMOIDOSCOPY;  Surgeon: [name], MD;  [facility];  Service: Endoscopy;  Laterality: N/A;  colonoscopy aboerted due to formed stool and poor prep   FOOT SURGERY     KNEE ARTHROSCOPY WITH MEDIAL  MENISECTOMY Left 05/06/2020   Procedure: KNEE ARTHROSCOPY WITH MEDIAL MENISCECTOMY AND LATERAL MENISCECTOMY;  Surgeon: [name], MD;  Location: [facility];  Service: Orthopedics;  Laterality: Left;   Lower ext duplex doppler  03/14/14   Rt ABI 1.2   LOWER EXTREMITY ANGIOGRAM Bilateral 02/18/2014   Procedure: LOWER EXTREMITY ANGIOGRAM;  Surgeon: [name], MD;  Location: [facility] CATH LAB;  Service: Cardiovascular;  Laterality: Bilateral;   LOWER EXTREMITY ANGIOGRAM N/A 10/31/2014   Procedure: LOWER EXTREMITY ANGIOGRAM;  Surgeon: [name], MD;  Location[facility] C[facility];  Service: Cardiovascular;  Laterality: N/A;   MALONEY DILATION N/A 10/11/2019   Procedure: Venia Minks DILATION;  Surgeon: [name], MD;  [facility];  Service: Endoscopy;  Laterality: N/A;   MULTIPLE TOOTH EXTRACTIONS     PV angiogram  02/18/2014   tibial vessel diseas bil.   SPINE SURGERY     tendon achillies lengthing and sesamoid      There were no vitals filed for this visit.   Subjective Assessment - 04/15/21 1153     Subjective Feeling great today.  Reports went to neurologist and reports renewed tremor medication.  Stated he has been having rough couple nights due to tremors    Pertinent History CHF , R foot wound, left knee scope, DB, Restless leg syndrome, hx of stroke.    Patient Stated Goals be able to walk normally and function correctly.    Currently in Pain? No/denies                               [facility] Adult PT Treatment/Exercise - 04/15/21 0001       Ambulation/Gait   Ambulation/Gait Yes    Ambulation/Gait Assistance 4: Min guard    Ambulation Distance (Feet) 204 Feet    Assistive device Rollator    Pre-Gait Activities Improved foot clearnance, cueing to stand within walker    Gait Comments 2MWT      Knee/Hip Exercises: Stretches   Active Hamstring Stretch Both;3 reps;60 seconds    Active Hamstring Stretch Limitations hands behind knee      Knee/Hip  Exercises: Standing   Other Standing Knee Exercises Retro gait x 19f    Other Standing Knee Exercises sidestep front of mat 2RT, able to complete Rt no A, requires mod A with Lt abd, significant hip dropping      Knee/Hip Exercises: Seated   Sit to Sand 2 sets;10 reps;without UE support;Other (comment)   standard height     Knee/Hip Exercises: Supine   Short Arc Quad Sets 2 sets;10 reps    Short Arc Quad Sets Limitations 5" holds    Hip Adduction Isometric Strengthening;Left;2 sets;10 reps    Hip Adduction Isometric Limitations isometric  Lt hip abduction    Bridges 3 sets;10 reps    Bridges Limitations 5" holds, RTB around thighs    Other Supine Knee/Hip Exercises abduciton with towel underneath      Knee/Hip Exercises: Sidelying   Clams 3x 10 5" holds; RTB 3rd set                      PT Short Term Goals - 04/06/21 1026       PT SHORT TERM GOAL #1   Title Patient will be independent in self management strategies to improve quality of life and functional outcomes.    Time 3    Period Weeks    Status New    Target Date 04/27/21      PT SHORT TERM GOAL #2   Title Patient will be able to report at least 25% improvement in overall mobility.    Time 3    Period Weeks    Status New    Target Date 04/27/21      PT SHORT TERM GOAL #3   Title Demo improved balance and BLE strength as evidenced by time of 18 sec 5xSTS test    Baseline 26 sec with BUE support    Time 3    Period Weeks    Status New    Target Date 04/27/21               PT Long Term Goals - 04/06/21 1029       PT LONG TERM GOAL #1   Title Patient will improve FOTO score by at least 10 points in order to indicate improved tolerance to activity.    Baseline 50% function    Time 6    Period Weeks    Status New    Target Date 05/18/21      PT LONG TERM GOAL #2   Title Demo improved gait velocity and safety as evidenced by distance of 150 ft during 2MWT    Baseline 60 ft with rollator     Time 6    Period Weeks    Status New    Target Date 05/18/21                   Plan - 04/15/21 1211     Clinical Impression Statement Began session wiht 2MWT, pt presents with improved foot clearance and increased stride length, cueing to stand within walker.  Added gait associated balance activities.  Pt presents with significant Lt gluteal weakness and hip dropping noted especially during sidestep activities. Reviewed current exercises completeing at home, encouraged to pay close attention to form as cueing required for compensation.  Reviewed importance of wearing pressure relief shoes for continued wound on foot.    Personal Factors and Comorbidities Comorbidity 1;Comorbidity 3+;Comorbidity 2    Comorbidities CHF, left knee surgery, right chronic foot wound, DB, restless leg syndromes    Examination-Activity Limitations Bathing;Lift;Locomotion Level;Transfers;Stand;Stairs;Squat;Reach Overhead;Carry    Examination-Participation Restrictions Community Activity;Meal Prep    Stability/Clinical Decision Making Evolving/Moderate complexity    Clinical Decision Making Moderate    Rehab Potential Fair    PT Frequency 2x / week    PT Duration 6 weeks    PT Treatment/Interventions ADLs/Self Care Home Management;Other (comment);Moist Heat;Balance training;Therapeutic exercise;Therapeutic activities;Functional mobility training;Stair training;Gait training;DME Instruction;Ultrasound;Neuromuscular re-education;Patient/family education;Orthotic Fit/Training;Manual techniques;Taping;Passive range of motion;Electrical Stimulation    PT Next Visit Plan Continue with BLE strength, balance    PT Home Exercise Plan hip abd/add isometric,  QS, hamstring stretch;  04/15/21: Isometric abd, abd supine, SAQ, bridge    Consulted and Agree with Plan of Care Patient             Patient will benefit from skilled therapeutic intervention in order to improve the following deficits and impairments:   Abnormal gait, Decreased endurance, Decreased skin integrity, Increased edema, Decreased activity tolerance, Decreased balance, Decreased mobility, Decreased knowledge of use of DME, Decreased strength, Difficulty walking, Pain, Decreased range of motion, Impaired flexibility, Postural dysfunction, Improper body mechanics  Visit Diagnosis: Muscle weakness (generalized)  Difficulty in walking, not elsewhere classified     Problem List Patient Active Problem List   Diagnosis Date Noted   Pneumonia 04/03/2021   PNA (pneumonia) 04/02/2021   Abnormal finding on urinalysis 04/01/2021   Syncope 02/16/2021   Minor head injury    Right hip pain 02/11/2021   Erectile dysfunction 01/22/2021   Gait abnormality 09/30/2020   Mild non proliferative diabetic retinopathy ([place]) 07/16/2020   Need for immunization against influenza 06/03/2020   S/P left knee arthroscopy 05/06/20 05/13/2020   Abnormal MRI 03/26/2020   Joint disorder of knee 03/26/2020   Fall at home, initial encounter 03/13/2020   Anemia 09/03/2019   Reflux esophagitis 09/03/2019   Esophageal dysphagia 05/30/2019   Constipation 05/30/2019   Positive colorectal cancer screening using Cologuard test 05/30/2019   Diabetic foot ulcer ([place]) 09/06/2018   Orthostasis 08/25/2016   Congestive heart failure ([place]) 05/17/2016   Bilateral carotid artery disease ([place]) 07/08/2014   Obstructive sleep apnea 02/12/2014   Restless legs 10/23/2013   Overweight with body mass index (BMI) of 29 to 29.9 in adult 10/23/2013   Hyperlipidemia 07/16/2013   Peripheral arterial disease ([place]) 07/16/2013   Hypertension 03/14/2012   Type 2 diabetes mellitus with circulatory disorder ([place]) 03/14/2012   Peripheral neuropathy 03/14/2012   Cardiomyopathy- EF NL 01/2013, now 30-35% echo 03/14/2012   [name], LPTA/CLT; CBIS [phone]  [name] 04/15/2021, 12:42 PM  [place], [place], [address] Phone: [phone]   Fax:  [phone]  Name: SUEDE MONIGOLD MRN: [id number] Date of Birth: 1945/12/06

## 2021-04-15 NOTE — Patient Instructions (Signed)
Supine    Lie on back, legs bent and feet flat. Grasp behind one leg and slowly try to straighten knee. Hold 30 seconds.  Repeat 3 times per session. Do 2 sessions per day.  Copyright  VHI. All rights reserved.   Abduction    Slide one leg out to side. Keep kneecap pointing up. Gently bring leg back to pillow. Repeat with other leg. Repeat 10 times. Do 3 sessions per day.  http://gt2.exer.us/373   Copyright  VHI. All rights reserved.   Short Arc [employer] a large can or rolled towel under left leg. Straighten leg. Hold 10 seconds. Repeat 10 times. Do 3 sessions per day.  http://gt2.exer.us/298   Copyright  VHI. All rights reserved.    Abduction: Isometric - Bilateral (Sitting)   Laying on your back with belt around around knees, push knees into belt and hold for 10".  Complete 10 reps, 10" holds.   Copyright  VHI. All rights reserved.   Bridging    Slowly raise buttocks from floor, keeping stomach tight. Repeat 10 times per set. Do 3 sets per session. Do 3 sessions per day.  http://orth.exer.us/1096   Copyright  VHI. All rights reserved.

## 2021-04-15 NOTE — Telephone Encounter (Signed)
Left message

## 2021-04-16 ENCOUNTER — Ambulatory Visit (HOSPITAL_COMMUNITY): Payer: HMO | Admitting: Physical Therapy

## 2021-04-17 NOTE — Telephone Encounter (Signed)
Left message

## 2021-04-20 NOTE — Telephone Encounter (Signed)
Left detailed message for pt to call our office back.

## 2021-04-22 ENCOUNTER — Ambulatory Visit: Payer: HMO | Admitting: Family Medicine

## 2021-04-22 ENCOUNTER — Inpatient Hospital Stay (HOSPITAL_COMMUNITY): Admission: RE | Admit: 2021-04-22 | Payer: HMO | Source: Ambulatory Visit

## 2021-04-22 ENCOUNTER — Ambulatory Visit (HOSPITAL_COMMUNITY): Payer: HMO | Admitting: Physical Therapy

## 2021-04-23 ENCOUNTER — Other Ambulatory Visit: Payer: Self-pay

## 2021-04-23 ENCOUNTER — Encounter: Payer: Self-pay | Admitting: Nurse Practitioner

## 2021-04-23 ENCOUNTER — Ambulatory Visit (HOSPITAL_COMMUNITY)
Admission: RE | Admit: 2021-04-23 | Discharge: 2021-04-23 | Disposition: A | Discharge status: Home / Self Care | Payer: HMO | Source: Ambulatory Visit | Attending: Nurse Practitioner | Admitting: Nurse Practitioner

## 2021-04-23 ENCOUNTER — Ambulatory Visit (INDEPENDENT_AMBULATORY_CARE_PROVIDER_SITE_OTHER): Payer: HMO | Admitting: Nurse Practitioner

## 2021-04-23 VITALS — BP 146/67 | HR 88 | Temp 97.5°F | Ht 69.0 in | Wt 193.0 lb

## 2021-04-23 DIAGNOSIS — R634 Abnormal weight loss: Secondary | ICD-10-CM

## 2021-04-23 DIAGNOSIS — R911 Solitary pulmonary nodule: Secondary | ICD-10-CM | POA: Diagnosis not present

## 2021-04-23 DIAGNOSIS — Y92009 Unspecified place in unspecified non-institutional (private) residence as the place of occurrence of the external cause: Secondary | ICD-10-CM

## 2021-04-23 DIAGNOSIS — R0789 Other chest pain: Secondary | ICD-10-CM | POA: Diagnosis not present

## 2021-04-23 DIAGNOSIS — J189 Pneumonia, unspecified organism: Secondary | ICD-10-CM

## 2021-04-23 DIAGNOSIS — R531 Weakness: Secondary | ICD-10-CM | POA: Diagnosis not present

## 2021-04-23 DIAGNOSIS — W19XXXA Unspecified fall, initial encounter: Secondary | ICD-10-CM

## 2021-04-23 DIAGNOSIS — M25512 Pain in left shoulder: Secondary | ICD-10-CM

## 2021-04-23 DIAGNOSIS — T148XXA Other injury of unspecified body region, initial encounter: Secondary | ICD-10-CM | POA: Insufficient documentation

## 2021-04-23 MED ORDER — DOXYCYCLINE HYCLATE 100 MG PO TABS: 100.0000 mg | ORAL_TABLET | Freq: Two times a day (BID) | ORAL | 0 refills | Status: DC

## 2021-04-23 NOTE — Addendum Note (Signed)
Addended by: Laretta Bolster on: 04/23/2021 02:32 PM   Modules accepted: Orders

## 2021-04-23 NOTE — Assessment & Plan Note (Signed)
-  today he states he has similar pain to when he was hospitalized for PNA -Rx. Doxycycline -CXR ordered

## 2021-04-23 NOTE — Addendum Note (Signed)
Addended by: Laretta Bolster on: 04/23/2021 02:30 PM   Modules accepted: Orders

## 2021-04-23 NOTE — Assessment & Plan Note (Signed)
-  had recent hospitalization for PNA -Rx. Doxy -get CXR today

## 2021-04-23 NOTE — Addendum Note (Signed)
Addended by: Laretta Bolster on: 04/23/2021 02:27 PM   Modules accepted: Orders

## 2021-04-23 NOTE — Assessment & Plan Note (Signed)
-  referral to wound clinic

## 2021-04-23 NOTE — Patient Instructions (Signed)
If chest tightness gets worse or shortness of breath develops, go to the emergency department for further work-up.

## 2021-04-23 NOTE — Progress Notes (Signed)
Established Patient Office Visit  Subjective:  Patient ID: [name], male    DOB: 1946/02/05  Age: 75 y.o. MRN: [id number]  CC:  Chief Complaint  Patient presents with   Follow-up    Lab follow up   Chest Pain    Had pneumonia about a month ago, got better. Today he is hurting again just like he was last month. Is also coughing up a lot.    HPI [name] presents for lab follow-up.  Reviewed records form recent hospitalization for CAP. At that time, CXR showed pulmonary nodules/lobar pneumonia, and pt has recent weight loss of 50# over the last year. He was scheduled for colonoscopy in June, but this was aborted d/t poor prep. Today is weight is up over 20 pounds from his weight while hospitalized.  He skipped rehab yesterday d/t illness.   He is having pain in his chest at same place when he was diagnosed with PNA.    Past Medical History:  Diagnosis Date   Aftercare following surgery of the circulatory system, NEC 12/04/2013   Angina decubitus ([place]) 05/19/2016   Arrhythmia 05/20/2016   Arthritis    ARTHRITIS, RIGHT FOOT 06/26/2008   Qualifier: Diagnosis of  By: Aline Brochure MD, [name]     Cardiomyopathy- EF NL 01/2013, now 30-35% echo 03/14/2012   Carotid artery occlusion    left s/p CEA   Cellulitis of left foot 05/07/2018   CHF (congestive heart failure) (HCC)    Critical lower limb ischemia ([place]) 02/28/2014   Critical limb ischemia    Diabetes mellitus    Elevated troponin 04/11/2016   GERD (gastroesophageal reflux disease)    History of stroke June 2013 03/14/2012   HOH (hard of hearing)    Hypertension    Hypothyroidism    Nonhealing skin ulcer ([place]) 10/28/2014   Obstructive sleep apnea    Orthostatic hypotension    Osteomyelitis (HCC)    left great toe   Pain in the chest 05/17/2016   Peripheral arterial disease ([place]), s/p PTA x 2 RLE    nonhealing ulcers bilaterally on each great toe   Pneumonia    PONV (postoperative nausea and vomiting)    Pulmonary  nodule 05/17/2016   Restless leg syndrome    Shortness of breath    Stroke [facility]) March 08, 2012   Toe osteomyelitis, left [facility])    Ulcer of great toe, left, with necrosis of bone ([place])    Wears dentures     Past Surgical History:  Procedure Laterality Date   AMPUTATION TOE Left 07/25/2018   Procedure: AMPUTATION TOE INTERPHALANGEAL HALLUX LEFT;  Surgeon: Evelina Bucy, DPM;  Location: [place];  Service: Podiatry;  Laterality: Left;   ANGIOPLASTY  02/28/14   diamond back orbital rotational atherectomy of Rt. tibial   BACK SURGERY     BONE BIOPSY Left 07/25/2018   Procedure: SUPERFICIAL BONE BIOPSY;  Surgeon: Evelina Bucy, DPM;  Location: [place];  Service: Podiatry;  Laterality: Left;   CARDIAC CATHETERIZATION N/A 05/19/2016   Procedure: Right/Left Heart Cath and Coronary Angiography;  Surgeon: Belva Crome, MD;  Location: [place] CV LAB;  Service: Cardiovascular;  Laterality: N/A;   CERVICAL FUSION     ENDARTERECTOMY Left 11/29/2013   Procedure: ENDARTERECTOMY CAROTID;  Surgeon: Serafina Mitchell, MD;  Location: [place];  Service: Vascular;  Laterality: Left;   ESOPHAGOGASTRODUODENOSCOPY  02/2010   Dr. Gala Romney: patient presented with food impaction, schatzki ring with superimposed component of  stricture with erosive reflux esophagitis, s/p disimpaction but dilation planned at later date    ESOPHAGOGASTRODUODENOSCOPY (EGD) WITH PROPOFOL N/A 08/13/2019   Dr. Gala Romney: Erosive reflux esophagitis with mild stricture and incidental Mallory-Weiss tear which precluded esophageal dilation.  Medium sized hiatal hernia.   ESOPHAGOGASTRODUODENOSCOPY (EGD) WITH PROPOFOL N/A 10/11/2019   Dr. Gala Romney: Esophageal stenosis status post dilation, moderate hiatal hernia   EYE SURGERY     FLEXIBLE SIGMOIDOSCOPY N/A 08/13/2019   Procedure: FLEXIBLE SIGMOIDOSCOPY;  Surgeon: [name], MD;  [facility];  Service: Endoscopy;  Laterality: N/A;  colonoscopy aboerted due to formed stool and poor prep    FOOT SURGERY     KNEE ARTHROSCOPY WITH MEDIAL MENISECTOMY Left 05/06/2020   Procedure: KNEE ARTHROSCOPY WITH MEDIAL MENISCECTOMY AND LATERAL MENISCECTOMY;  Surgeon: [name], MD;  Location: [facility];  Service: Orthopedics;  Laterality: Left;   Lower ext duplex doppler  03/14/14   Rt ABI 1.2   LOWER EXTREMITY ANGIOGRAM Bilateral 02/18/2014   Procedure: LOWER EXTREMITY ANGIOGRAM;  Surgeon: [name], MD;  Location: [facility] CATH LAB;  Service: Cardiovascular;  Laterality: Bilateral;   LOWER EXTREMITY ANGIOGRAM N/A 10/31/2014   Procedure: LOWER EXTREMITY ANGIOGRAM;  Surgeon: [name], MD;  Location: [facility] CATH LAB;  Service: Cardiovascular;  Laterality: N/A;   MALONEY DILATION N/A 10/11/2019   Procedure: Venia Minks DILATION;  Surgeon: [name], MD;  [facility];  Service: Endoscopy;  Laterality: N/A;   MULTIPLE TOOTH EXTRACTIONS     PV angiogram  02/18/2014   tibial vessel diseas bil.   SPINE SURGERY     tendon achillies lengthing and sesamoid      Family History  Problem Relation Age of Onset   Heart disease Mother    Hypertension Mother    Heart attack Mother    Hypertension Father    Diabetes Father    Diabetes Son    Heart disease Son    Hypertension Son    Colon cancer Neg Hx     Social History   Socioeconomic History   Marital status: Married    Spouse name: [name]    Number of children: 4   Years of education: college   Highest education level: Not on file  Occupational History   Occupation: Firefighter   Tobacco Use   Smoking status: Former    Types: Pipe    Quit date: 07/08/1977    Years since quitting: 43.8   Smokeless tobacco: Never  Vaping Use   Vaping Use: Never used  Substance and Sexual Activity   Alcohol use: No    Alcohol/week: 0.0 standard drinks   Drug use: No   Sexual activity: Not on file  Other Topics Concern   Not on file  Social History Narrative   Retired from [employer] social research officer -[employer]      Lives with [name]     Cat: Jazz      Enjoy: sleeping a lot       Diet: eats all food groups -chicken, steak-chopped, mostly veggie   Caffeine: coffee and soda "a lot"   Water: 3-4 cups daily      Wears seat belt   Does not use phone while driving    Oceanographer at home    weapons at home        Social Determinants of Health   Financial Resource Strain: Not on file  Food Insecurity: No Food Insecurity   Worried About Running Out of  Food in the Last Year: Never true   Micro in the Last Year: Never true  Transportation Needs: No Transportation Needs   Lack of Transportation (Medical): No   Lack of Transportation (Non-Medical): No  Physical Activity: Not on file  Stress: Not on file  Social Connections: Moderately Isolated   Frequency of Communication with Friends and Family: Three times a week   Frequency of Social Gatherings with Friends and Family: Three times a week   Attends Religious Services: Never   Active Member of Clubs or Organizations: No   Attends Archivist Meetings: Never   Marital Status: Married  Human resources officer Violence: Not on file    Outpatient Medications Prior to Visit  Medication Sig Dispense Refill   aspirin EC 81 MG EC tablet Take 1 tablet (81 mg total) by mouth daily.     b complex vitamins tablet Take 1 tablet by mouth daily.     carvedilol (COREG) 3.125 MG tablet Take 0.5 tablets (1.5625 mg total) by mouth 2 (two) times daily with a meal. 180 tablet 3   Cholecalciferol (VITAMIN D3) 5000 units TABS Take 5,000 Units by mouth 2 (two) times daily.      clopidogrel (PLAVIX) 75 MG tablet TAKE ONE (1) TABLET BY MOUTH EVERY DAY 90 tablet 0   Continuous Blood Gluc Receiver (FREESTYLE LIBRE 14 DAY READER) DEVI      Continuous Blood Gluc Sensor (FREESTYLE LIBRE 14 DAY SENSOR) MISC Inject 1 each into the skin every 14 (fourteen) days. 2 each 5   Cyanocobalamin 1500 MCG TBDP Take 1,500 mcg by mouth daily.      DULoxetine (CYMBALTA) 60 MG capsule Take 1  capsule (60 mg total) by mouth daily. 90 capsule 1   empagliflozin (JARDIANCE) 25 MG TABS tablet Take 1 tablet (25 mg total) by mouth daily before breakfast. 90 tablet 1   furosemide (LASIX) 40 MG tablet Take 1 tablet (40 mg total) by mouth daily.     levothyroxine (SYNTHROID) 88 MCG tablet Take 1 tablet (88 mcg total) by mouth daily. 90 tablet 3   linagliptin (TRADJENTA) 5 MG TABS tablet Take 1 tablet (5 mg total) by mouth daily. 30 tablet    Multiple Vitamin (MULTIVITAMIN WITH MINERALS) TABS tablet Take 1 tablet by mouth daily.     nitroGLYCERIN (NITROSTAT) 0.4 MG SL tablet Place 1 tablet (0.4 mg total) under the tongue every 5 (five) minutes as needed for chest pain. 25 tablet 3   pantoprazole (PROTONIX) 40 MG tablet TAKE ONE (1) TABLET BY MOUTH EVERY DAY 30 tablet 11   potassium chloride (KLOR-CON) 10 MEQ tablet TAKE ONE TABLET (10MEQ TOTAL) BY MOUTH DAILY 30 tablet 0   rosuvastatin (CRESTOR) 20 MG tablet Take 1 tablet (20 mg total) by mouth daily. 90 tablet 1   UNABLE TO FIND Home health supplies for CPAP 1 Product 0   UNABLE TO FIND [place] sock aid to help with support hose. Size XL.  Dx:I73.9, G25.81, R60.9 1 each 0   guaiFENesin-dextromethorphan (ROBITUSSIN DM) 100-10 MG/5ML syrup Take 10 mLs by mouth every 4 (four) hours as needed for cough. (Patient not taking: Reported on 04/23/2021) 118 mL 0   pramipexole (MIRAPEX) 0.25 MG tablet Take 2 tablets (0.5 mg total) by mouth 2 (two) times daily. Take 0.72m by mouth at 7 PM and 0.597mat 9 PM daily. (Patient not taking: Reported on 04/23/2021) 360 tablet 3   No facility-administered medications prior to visit.    Allergies  Allergen Reactions   Codeine Nausea And Vomiting   Tramadol Nausea Only    ROS Review of Systems  Constitutional: Negative.   Respiratory:  Positive for chest tightness and shortness of breath.        Feels similar to previous PNA  Cardiovascular: Negative.   Musculoskeletal:  Positive for arthralgias.       Left  shoulder pain  Skin:  Positive for wound.       DTI to plantar surface of right foot     Objective:    Physical Exam Constitutional:      General: He is not in acute distress.    Appearance: Normal appearance. He is ill-appearing.  Cardiovascular:     Rate and Rhythm: Normal rate and regular rhythm.     Heart sounds: Murmur (2/6) heard.  Pulmonary:     Effort: Pulmonary effort is normal.     Breath sounds: Normal breath sounds.  Musculoskeletal:     Comments: Pain to left shoulder with ROM exercises  Skin:    Comments: DTI to plantar surface of right foot; size of nickel  Neurological:     Mental Status: He is alert.  Psychiatric:        Mood and Affect: Mood normal.        Behavior: Behavior normal.        Thought Content: Thought content normal.        Judgment: Judgment normal.    BP (!) 146/67 (BP Location: Right Arm, Patient Position: Sitting, Cuff Size: Large)   Pulse 88   Temp (!) 97.5 F (36.4 C) (Temporal)   Ht _0  (1.753 m)   Wt 193 lb (87.5 kg)   SpO2 92%   BMI 28.50 kg/m  Wt Readings from Last 3 Encounters:  04/23/21 193 lb (87.5 kg)  04/08/21 169 lb 9.6 oz (76.9 kg)  03/18/21 191 lb 3.2 oz (86.7 kg)     Health Maintenance Due  Topic Date Due   Zoster Vaccines- Shingrix (1 of 2) Never done   OPHTHALMOLOGY EXAM  02/07/2021   INFLUENZA VACCINE  04/20/2021    There are no preventive care reminders to display for this patient.  Lab Results  Component Value Date   TSH 2.445 02/17/2021   Lab Results  Component Value Date   WBC 10.9 (H) 04/03/2021   HGB 9.8 (L) 04/03/2021   HCT 30.3 (L) 04/03/2021   MCV 103.1 (H) 04/03/2021   PLT 218 04/03/2021   Lab Results  Component Value Date   NA 137 04/03/2021   K 4.0 04/03/2021   CO2 27 04/03/2021   GLUCOSE 95 04/03/2021   BUN 12 04/03/2021   CREATININE 0.62 04/03/2021   BILITOT 0.8 04/02/2021   ALKPHOS 52 04/02/2021   AST 16 04/02/2021   ALT 13 04/02/2021   PROT 6.9 04/02/2021   ALBUMIN  3.9 04/02/2021   CALCIUM 8.3 (L) 04/03/2021   ANIONGAP 6 04/03/2021   EGFR 93 03/18/2021   Lab Results  Component Value Date   CHOL 149 01/26/2021   Lab Results  Component Value Date   HDL 46 01/26/2021   Lab Results  Component Value Date   LDLCALC 88 01/26/2021   Lab Results  Component Value Date   TRIG 78 01/26/2021   Lab Results  Component Value Date   CHOLHDL 2.8 09/25/2020   Lab Results  Component Value Date   HGBA1C 9.6 (H) 01/26/2021      Assessment & Plan:   Problem  List Items Addressed This Visit       Respiratory   PNA (pneumonia)    -today he states he has similar pain to when he was hospitalized for PNA -Rx. Doxycycline -CXR ordered       Relevant Medications   doxycycline (VIBRA-TABS) 100 MG tablet   Other Relevant Orders   DG Chest 2 View     Other   Fall at home, initial encounter    -has residual left shoulder pain -has generalized weakness; needed staff to put him in wheelchair today -he states he has help at home and can ambulate up his stairs with assistance from his wife -referral PT/OT       Recent unexplained weight loss    Wt Readings from Last 20 Encounters:  04/23/21 193 lb (87.5 kg)  04/08/21 169 lb 9.6 oz (76.9 kg)  03/18/21 191 lb 3.2 oz (86.7 kg)  02/17/21 199 lb 8.3 oz (90.5 kg)  02/11/21 199 lb 9.6 oz (90.5 kg)  01/22/21 205 lb (93 kg)  01/07/21 195 lb 9.6 oz (88.7 kg)  12/22/20 195 lb 3.2 oz (88.5 kg)  12/12/20 213 lb (96.6 kg)  09/30/20 213 lb (96.6 kg)  08/11/20 165 lb (74.8 kg)  07/11/20 207 lb (93.9 kg)  07/10/20 207 lb (93.9 kg)  06/03/20 207 lb 6.4 oz (94.1 kg)  05/13/20 212 lb (96.2 kg)  05/01/20 212 lb (96.2 kg)  04/16/20 (!) 212 lb (96.2 kg)  04/10/20 (!) 212 lb (96.2 kg)  04/01/20 212 lb (96.2 kg)  03/26/20 217 lb (98.4 kg)  -scales at time of discharge are vastly different from previous weight measurements -still needs colonoscopy, and he will set that up -repeat chest CT in 3-6 months;  ordered today      Left shoulder pain    -had recent falls at home and is requesting therapy -referral to PT/OT       Relevant Orders   Ambulatory referral to Occupational Therapy   Ambulatory referral to Physical Therapy   Chest tightness    -had recent hospitalization for PNA -Rx. Doxy -get CXR today       Relevant Medications   doxycycline (VIBRA-TABS) 100 MG tablet   Other Relevant Orders   DG Chest 2 View   Other Visit Diagnoses     Pulmonary nodule    -  Primary   Relevant Orders   CT Chest Wo Contrast   Deep tissue injury       Relevant Orders   Ambulatory referral to Wound Clinic       Meds ordered this encounter  Medications   doxycycline (VIBRA-TABS) 100 MG tablet    Sig: Take 1 tablet (100 mg total) by mouth 2 (two) times daily.    Dispense:  20 tablet    Refill:  0     Follow-up: Return in about 1 week (around 04/30/2021) for Follow-up (?PNA).    Noreene Larsson, NP

## 2021-04-23 NOTE — Assessment & Plan Note (Signed)
-  had recent falls at home and is requesting therapy -referral to PT/OT

## 2021-04-23 NOTE — Assessment & Plan Note (Signed)
Wt Readings from Last 20 Encounters:  04/23/21 193 lb (87.5 kg)  04/08/21 169 lb 9.6 oz (76.9 kg)  03/18/21 191 lb 3.2 oz (86.7 kg)  02/17/21 199 lb 8.3 oz (90.5 kg)  02/11/21 199 lb 9.6 oz (90.5 kg)  01/22/21 205 lb (93 kg)  01/07/21 195 lb 9.6 oz (88.7 kg)  12/22/20 195 lb 3.2 oz (88.5 kg)  12/12/20 213 lb (96.6 kg)  09/30/20 213 lb (96.6 kg)  08/11/20 165 lb (74.8 kg)  07/11/20 207 lb (93.9 kg)  07/10/20 207 lb (93.9 kg)  06/03/20 207 lb 6.4 oz (94.1 kg)  05/13/20 212 lb (96.2 kg)  05/01/20 212 lb (96.2 kg)  04/16/20 (!) 212 lb (96.2 kg)  04/10/20 (!) 212 lb (96.2 kg)  04/01/20 212 lb (96.2 kg)  03/26/20 217 lb (98.4 kg)   -scales at time of discharge are vastly different from previous weight measurements -still needs colonoscopy, and he will set that up -repeat chest CT in 3-6 months; ordered today

## 2021-04-23 NOTE — Assessment & Plan Note (Signed)
-  has residual left shoulder pain -has generalized weakness; needed staff to put him in wheelchair today -he states he has help at home and can ambulate up his stairs with assistance from his wife -referral PT/OT

## 2021-04-24 ENCOUNTER — Encounter (HOSPITAL_COMMUNITY): Payer: Self-pay

## 2021-04-24 ENCOUNTER — Ambulatory Visit (HOSPITAL_COMMUNITY): Payer: HMO | Attending: Family Medicine

## 2021-04-24 ENCOUNTER — Telehealth: Payer: Self-pay

## 2021-04-24 ENCOUNTER — Other Ambulatory Visit: Payer: Self-pay

## 2021-04-24 DIAGNOSIS — G8929 Other chronic pain: Secondary | ICD-10-CM | POA: Diagnosis not present

## 2021-04-24 DIAGNOSIS — M25612 Stiffness of left shoulder, not elsewhere classified: Secondary | ICD-10-CM | POA: Diagnosis not present

## 2021-04-24 DIAGNOSIS — R262 Difficulty in walking, not elsewhere classified: Secondary | ICD-10-CM

## 2021-04-24 DIAGNOSIS — M25512 Pain in left shoulder: Secondary | ICD-10-CM | POA: Diagnosis not present

## 2021-04-24 DIAGNOSIS — M6281 Muscle weakness (generalized): Secondary | ICD-10-CM | POA: Diagnosis not present

## 2021-04-24 DIAGNOSIS — R29898 Other symptoms and signs involving the musculoskeletal system: Secondary | ICD-10-CM | POA: Insufficient documentation

## 2021-04-24 DIAGNOSIS — R2681 Unsteadiness on feet: Secondary | ICD-10-CM | POA: Insufficient documentation

## 2021-04-24 NOTE — Therapy (Signed)
[facility] [address] [place], [place], [address] Phone: [phone]   Fax:  [phone]  Physical Therapy Treatment  Patient Details  Name: [name] MRN: [id number] Date of Birth: Feb 18, 1946 Referring Provider (PT): Noreene Larsson, NP   Encounter Date: 04/24/2021   PT End of Session - 04/24/21 1130     Visit Number 5    Number of Visits 12    Date for PT Re-Evaluation 05/18/21    Authorization Type [employer], no VL, no auth ,    Progress Note Due on Visit 10    PT Start Time 1115    PT Stop Time 1200    PT Time Calculation (min) 45 min    Equipment Utilized During Treatment Gait belt    Activity Tolerance Patient tolerated treatment well    Behavior During Therapy [facility] for tasks assessed/performed             Past Medical History:  Diagnosis Date   Aftercare following surgery of the circulatory system, NEC 12/04/2013   Angina decubitus ([place]) 05/19/2016   Arrhythmia 05/20/2016   Arthritis    ARTHRITIS, RIGHT FOOT 06/26/2008   Qualifier: Diagnosis of  By: Aline Brochure MD, [name]     Cardiomyopathy- EF NL 01/2013, now 30-35% echo 03/14/2012   Carotid artery occlusion    left s/p CEA   Cellulitis of left foot 05/07/2018   CHF (congestive heart failure) ([place])    Critical lower limb ischemia ([place]) 02/28/2014   Critical limb ischemia    Diabetes mellitus    Elevated troponin 04/11/2016   GERD (gastroesophageal reflux disease)    History of stroke June 2013 03/14/2012   HOH (hard of hearing)    Hypertension    Hypothyroidism    Nonhealing skin ulcer ([place]) 10/28/2014   Obstructive sleep apnea    Orthostatic hypotension    Osteomyelitis ([place])    left great toe   Pain in the chest 05/17/2016   Peripheral arterial disease ([place]), s/p PTA x 2 RLE    nonhealing ulcers bilaterally on each great toe   Pneumonia    PONV (postoperative nausea and vomiting)    Pulmonary nodule 05/17/2016   Restless leg syndrome    Shortness  of breath    Stroke [facility]) March 08, 2012   Toe osteomyelitis, left [facility])    Ulcer of great toe, left, with necrosis of bone ([place])    Wears dentures     Past Surgical History:  Procedure Laterality Date   AMPUTATION TOE Left 07/25/2018   Procedure: AMPUTATION TOE INTERPHALANGEAL HALLUX LEFT;  Surgeon: Evelina Bucy, DPM;  Location: [facility];  Service: Podiatry;  Laterality: Left;   ANGIOPLASTY  02/28/14   diamond back orbital rotational atherectomy of Rt. tibial   BACK SURGERY     BONE BIOPSY Left 07/25/2018   Procedure: SUPERFICIAL BONE BIOPSY;  Surgeon: Evelina Bucy, DPM;  Location: [facility];  Service: Podiatry;  Laterality: Left;   CARDIAC CATHETERIZATION N/A 05/19/2016   Procedure: Right/Left Heart Cath and Coronary Angiography;  Surgeon: Belva Crome, MD;  Location: [facility] CV LAB;  Service: Cardiovascular;  Laterality: N/A;   CERVICAL FUSION     ENDARTERECTOMY Left 11/29/2013   Procedure: ENDARTERECTOMY CAROTID;  Surgeon: Serafina Mitchell, MD;  Location: [facility];  Service: Vascular;  Laterality: Left;   ESOPHAGOGASTRODUODENOSCOPY  02/2010   Dr. Gala Romney: patient presented with food impaction, schatzki ring with superimposed component of stricture with erosive  reflux esophagitis, s/p disimpaction but dilation planned at later date    ESOPHAGOGASTRODUODENOSCOPY (EGD) WITH PROPOFOL N/A 08/13/2019   Dr. Gala Romney: Erosive reflux esophagitis with mild stricture and incidental Mallory-Weiss tear which precluded esophageal dilation.  Medium sized hiatal hernia.   ESOPHAGOGASTRODUODENOSCOPY (EGD) WITH PROPOFOL N/A 10/11/2019   Dr. Gala Romney: Esophageal stenosis status post dilation, moderate hiatal hernia   EYE SURGERY     FLEXIBLE SIGMOIDOSCOPY N/A 08/13/2019   Procedure: FLEXIBLE SIGMOIDOSCOPY;  Surgeon: [name], MD;  [facility];  Service: Endoscopy;  Laterality: N/A;  colonoscopy aboerted due to formed stool and poor prep   FOOT SURGERY     KNEE ARTHROSCOPY WITH MEDIAL  MENISECTOMY Left 05/06/2020   Procedure: KNEE ARTHROSCOPY WITH MEDIAL MENISCECTOMY AND LATERAL MENISCECTOMY;  Surgeon: [name], MD;  Location: [facility];  Service: Orthopedics;  Laterality: Left;   Lower ext duplex doppler  03/14/14   Rt ABI 1.2   LOWER EXTREMITY ANGIOGRAM Bilateral 02/18/2014   Procedure: LOWER EXTREMITY ANGIOGRAM;  Surgeon: [name], MD;  Location: [facility] C[facility];  Service: Cardiovascular;  Laterality: Bilateral;   LOWER EXTREMITY ANGIOGRAM N/A 10/31/2014   Procedure: LOWER EXTREMITY ANGIOGRAM;  Surgeon: [name], MD;  Location: [facility] CATH LAB;  Service: Cardiovascular;  Laterality: N/A;   MALONEY DILATION N/A 10/11/2019   Procedure: Venia Minks DILATION;  Surgeon: [name], MD;  [facility];  Service: Endoscopy;  Laterality: N/A;   MULTIPLE TOOTH EXTRACTIONS     PV angiogram  02/18/2014   tibial vessel diseas bil.   SPINE SURGERY     tendon achillies lengthing and sesamoid      There were no vitals filed for this visit.   Subjective Assessment - 04/24/21 1130     Subjective Pt reports recent diagnosis of pna which has been limiting his activity tolerance                [facility] PT Assessment - 04/24/21 0001       Assessment   Medical Diagnosis R foot ulcer and gait instability                           [facility] Adult PT Treatment/Exercise - 04/24/21 0001       Ambulation/Gait   Ambulation/Gait Yes    Ambulation/Gait Assistance 5: Supervision    Ambulation Distance (Feet) 150 Feet    Assistive device Rollator    Gait Pattern Step-through pattern;Scissoring;Trendelenburg      Knee/Hip Exercises: Stretches   Passive Hamstring Stretch Both;4 reps;60 seconds    Passive Hamstring Stretch Limitations manually assisted      Knee/Hip Exercises: Aerobic   Nustep level 4 x 6 min for dynamic warm-up      Knee/Hip Exercises: Supine   Quad Sets Strengthening;Both;3 sets;10 reps    Short Arc Quad Sets  Strengthening;Both;3 sets;10 reps    Short Arc Quad Sets Limitations 5#    Heel Slides Strengthening;Both;3 sets;10 reps    Heel Slides Limitations with green stability ball for bilateral flexion    Hip Adduction Isometric Strengthening;Both;3 sets;10 reps    Straight Leg Raises Strengthening;Both;3 sets;10 reps    Straight Leg Raises Limitations 5# RLE, no # LLE                      PT Short Term Goals - 04/06/21 1026       PT SHORT TERM GOAL #1  Title Patient will be independent in self management strategies to improve quality of life and functional outcomes.    Time 3    Period Weeks    Status New    Target Date 04/27/21      PT SHORT TERM GOAL #2   Title Patient will be able to report at least 25% improvement in overall mobility.    Time 3    Period Weeks    Status New    Target Date 04/27/21      PT SHORT TERM GOAL #3   Title Demo improved balance and BLE strength as evidenced by time of 18 sec 5xSTS test    Baseline 26 sec with BUE support    Time 3    Period Weeks    Status New    Target Date 04/27/21               PT Long Term Goals - 04/06/21 1029       PT LONG TERM GOAL #1   Title Patient will improve FOTO score by at least 10 points in order to indicate improved tolerance to activity.    Baseline 50% function    Time 6    Period Weeks    Status New    Target Date 05/18/21      PT LONG TERM GOAL #2   Title Demo improved gait velocity and safety as evidenced by distance of 150 ft during 2MWT    Baseline 60 ft with rollator    Time 6    Period Weeks    Status New    Target Date 05/18/21                   Plan - 04/24/21 1226     Clinical Impression Statement Demonstrating improved strength in BLE as evidenced by increase in resistance and repetition. Continues to exhibit pronounced gait deviations and unsteadiness on feet with heavy reliance on UE support with flexed knees in stance phase and frontal plane weakness evident  with Trendelenberg and scissoring pattern. Continued sessions indicated to improve strength, dynamic balance, and reduce risk for falls    Personal Factors and Comorbidities Comorbidity 1;Comorbidity 3+;Comorbidity 2    Comorbidities CHF, left knee surgery, right chronic foot wound, DB, restless leg syndromes    Examination-Activity Limitations Bathing;Lift;Locomotion Level;Transfers;Stand;Stairs;Squat;Reach Overhead;Carry    Examination-Participation Restrictions Community Activity;Meal Prep    Stability/Clinical Decision Making Evolving/Moderate complexity    Rehab Potential Fair    PT Frequency 2x / week    PT Duration 6 weeks    PT Treatment/Interventions ADLs/Self Care Home Management;Other (comment);Moist Heat;Balance training;Therapeutic exercise;Therapeutic activities;Functional mobility training;Stair training;Gait training;DME Instruction;Ultrasound;Neuromuscular re-education;Patient/family education;Orthotic Fit/Training;Manual techniques;Taping;Passive range of motion;Electrical Stimulation    PT Next Visit Plan Continue with BLE strength, balance    PT Home Exercise Plan hip abd/add isometric, QS, hamstring stretch;  04/15/21: Isometric abd, abd supine, SAQ, bridge    Consulted and Agree with Plan of Care Patient             Patient will benefit from skilled therapeutic intervention in order to improve the following deficits and impairments:  Abnormal gait, Decreased endurance, Decreased skin integrity, Increased edema, Decreased activity tolerance, Decreased balance, Decreased mobility, Decreased knowledge of use of DME, Decreased strength, Difficulty walking, Pain, Decreased range of motion, Impaired flexibility, Postural dysfunction, Improper body mechanics  Visit Diagnosis: Muscle weakness (generalized)  Difficulty in walking, not elsewhere classified  Unsteadiness on feet     Problem  List Patient Active Problem List   Diagnosis Date Noted   Recent unexplained  weight loss 04/23/2021   Left shoulder pain 04/23/2021   Chest tightness 04/23/2021   Deep tissue injury 04/23/2021   Pneumonia 04/03/2021   PNA (pneumonia) 04/02/2021   Abnormal finding on urinalysis 04/01/2021   Syncope 02/16/2021   Minor head injury    Right hip pain 02/11/2021   Erectile dysfunction 01/22/2021   Gait abnormality 09/30/2020   Mild non proliferative diabetic retinopathy ([place]) 07/16/2020   Need for immunization against influenza 06/03/2020   S/P left knee arthroscopy 05/06/20 05/13/2020   Abnormal MRI 03/26/2020   Joint disorder of knee 03/26/2020   Fall at home, initial encounter 03/13/2020   Anemia 09/03/2019   Reflux esophagitis 09/03/2019   Esophageal dysphagia 05/30/2019   Constipation 05/30/2019   Positive colorectal cancer screening using Cologuard test 05/30/2019   Diabetic foot ulcer ([place]) 09/06/2018   Orthostasis 08/25/2016   Congestive heart failure ([place]) 05/17/2016   Bilateral carotid artery disease ([place]) 07/08/2014   Obstructive sleep apnea 02/12/2014   Restless legs 10/23/2013   Overweight with body mass index (BMI) of 29 to 29.9 in adult 10/23/2013   Hyperlipidemia 07/16/2013   Peripheral arterial disease ([place]) 07/16/2013   Hypertension 03/14/2012   Type 2 diabetes mellitus with circulatory disorder ([place]) 03/14/2012   Peripheral neuropathy 03/14/2012   Cardiomyopathy- EF NL 01/2013, now 30-35% echo 03/14/2012   12:29 PM, 04/24/21 M. Sherlyn Lees, PT, DPT Physical Therapist- [place] Office Number: [phone]   [facility] [address] [place], [place], [address] Phone: [phone]   Fax:  [phone]  Name: [name] MRN: [id number] Date of Birth: [birth year]

## 2021-04-24 NOTE — Telephone Encounter (Signed)
Patient called needs wound center referral changed from [facility] in [place] on [address] [place].

## 2021-04-27 ENCOUNTER — Telehealth: Payer: Self-pay

## 2021-04-27 NOTE — Telephone Encounter (Signed)
Pt called to let us know that he is coughing up blood every time he coughs that started today and feels a heaviness in his chest when he lays down and flat on his back. Instructed him to go to the ER and pt is agreeable with that.

## 2021-04-27 NOTE — Telephone Encounter (Signed)
Good call! That definitely needs an ED eval.

## 2021-04-27 NOTE — Progress Notes (Signed)
His right middle lung still has some consolidation, so the doxycycline should help get rid of that.

## 2021-04-27 NOTE — Telephone Encounter (Signed)
Pt informed

## 2021-04-28 ENCOUNTER — Ambulatory Visit (HOSPITAL_COMMUNITY): Payer: HMO

## 2021-04-28 ENCOUNTER — Telehealth (HOSPITAL_COMMUNITY): Payer: Self-pay

## 2021-04-28 NOTE — Telephone Encounter (Signed)
No show, called and spoke to pt regarding missed apt today.  Pt stated he called and canceled apt for today on way to hospital yesterday as he continues to cough up blood.  Reports he has apt wiht MD tomorrow.  Reminded next apt date and time, encouraged pt to call and cancel/reschedule if unable to make it.    [name], LPTA/CLT; Delana Meyer [phone]

## 2021-04-29 ENCOUNTER — Ambulatory Visit (INDEPENDENT_AMBULATORY_CARE_PROVIDER_SITE_OTHER): Payer: HMO | Admitting: Nurse Practitioner

## 2021-04-29 ENCOUNTER — Encounter: Payer: Self-pay | Admitting: Nurse Practitioner

## 2021-04-29 ENCOUNTER — Other Ambulatory Visit: Payer: Self-pay

## 2021-04-29 VITALS — BP 110/66 | HR 90 | Temp 97.1°F | Ht 69.0 in | Wt 161.0 lb

## 2021-04-29 DIAGNOSIS — R5383 Other fatigue: Secondary | ICD-10-CM | POA: Diagnosis not present

## 2021-04-29 DIAGNOSIS — J189 Pneumonia, unspecified organism: Secondary | ICD-10-CM | POA: Diagnosis not present

## 2021-04-29 NOTE — Addendum Note (Signed)
Addended by: Laretta Bolster on: 04/29/2021 10:27 AM   Modules accepted: Orders

## 2021-04-29 NOTE — Assessment & Plan Note (Signed)
-  likely related to his PNA -he had a recent fall, and he is taking PT -discussed that he should consider going to hospital if he feels like it is unsafe for him to stay at home -he states that he is able to eat and ambulate at home, and he would prefer to stay at home

## 2021-04-29 NOTE — Assessment & Plan Note (Signed)
-  doesn't meet admission criteria per [facility]B-65; he is stable -still has significant rhonchi in right lung fields -finishing doxycycline -will get sputum culture; may change abd based on results -referral to pulmonology

## 2021-04-29 NOTE — Progress Notes (Signed)
Acute Office Visit  Subjective:    Patient ID: [name], male    DOB: [birth year], 75 y.o.   MRN: [id number]  Chief Complaint  Patient presents with   Shortness of Breath    Follow up, feels worse today than he has.    Shortness of Breath Pertinent negatives include no fever.  Patient is in today for follow-up for PNA. At last OV, we started doxycycline because he was feeling worse after his recent hospitalization for PNA. F/u CXR showed hazy mid right lung, but no major changes since CXR from 04/02/21.  At his last OV, he was having difficulty with ambulation, and he has been seeing PT.  He had a fall several days ago. He states he was groggy and sitting on the end of his bed and was changing clothes, and he fell asleep and fell of the end of his bed.  No headaches/neuro issues or LOC associated with that fall.  Today, he states that he has considerable fatigue. He had a fall at home and has some bruising on his forehead.  He just feels weak.    Past Medical History:  Diagnosis Date   Aftercare following surgery of the circulatory system, NEC 12/04/2013   Angina decubitus ([place]) 05/19/2016   Arrhythmia 05/20/2016   Arthritis    ARTHRITIS, RIGHT FOOT 06/26/2008   Qualifier: Diagnosis of  By: Aline Brochure MD, [name]     Cardiomyopathy- EF NL 01/2013, now 30-35% echo 03/14/2012   Carotid artery occlusion    left s/p CEA   Cellulitis of left foot 05/07/2018   CHF (congestive heart failure) (HCC)    Critical lower limb ischemia ([place]) 02/28/2014   Critical limb ischemia    Diabetes mellitus    Elevated troponin 04/11/2016   GERD (gastroesophageal reflux disease)    History of stroke June 2013 03/14/2012   HOH (hard of hearing)    Hypertension    Hypothyroidism    Nonhealing skin ulcer ([place]) 10/28/2014   Obstructive sleep apnea    Orthostatic hypotension    Osteomyelitis (HCC)    left great toe   Pain in the chest 05/17/2016   Peripheral arterial disease ([place]), s/p PTA x 2 RLE     nonhealing ulcers bilaterally on each great toe   Pneumonia    PONV (postoperative nausea and vomiting)    Pulmonary nodule 05/17/2016   Restless leg syndrome    Shortness of breath    Stroke [facility]) March 08, 2012   Toe osteomyelitis, left [facility])    Ulcer of great toe, left, with necrosis of bone ([place])    Wears dentures     Past Surgical History:  Procedure Laterality Date   AMPUTATION TOE Left 07/25/2018   Procedure: AMPUTATION TOE INTERPHALANGEAL HALLUX LEFT;  Surgeon: Evelina Bucy, DPM;  Location: [place];  Service: Podiatry;  Laterality: Left;   ANGIOPLASTY  02/28/14   diamond back orbital rotational atherectomy of Rt. tibial   BACK SURGERY     BONE BIOPSY Left 07/25/2018   Procedure: SUPERFICIAL BONE BIOPSY;  Surgeon: Evelina Bucy, DPM;  Location: [place];  Service: Podiatry;  Laterality: Left;   CARDIAC CATHETERIZATION N/A 05/19/2016   Procedure: Right/Left Heart Cath and Coronary Angiography;  Surgeon: Belva Crome, MD;  Location: [place] CV LAB;  Service: Cardiovascular;  Laterality: N/A;   CERVICAL FUSION     ENDARTERECTOMY Left 11/29/2013   Procedure: ENDARTERECTOMY CAROTID;  Surgeon: Serafina Mitchell, MD;  Location: [place];  Service: Vascular;  Laterality: Left;   ESOPHAGOGASTRODUODENOSCOPY  02/2010   Dr. Gala Romney: patient presented with food impaction, schatzki ring with superimposed component of stricture with erosive reflux esophagitis, s/p disimpaction but dilation planned at later date    ESOPHAGOGASTRODUODENOSCOPY (EGD) WITH PROPOFOL N/A 08/13/2019   Dr. Gala Romney: Erosive reflux esophagitis with mild stricture and incidental Mallory-Weiss tear which precluded esophageal dilation.  Medium sized hiatal hernia.   ESOPHAGOGASTRODUODENOSCOPY (EGD) WITH PROPOFOL N/A 10/11/2019   Dr. Gala Romney: Esophageal stenosis status post dilation, moderate hiatal hernia   EYE SURGERY     FLEXIBLE SIGMOIDOSCOPY N/A 08/13/2019   Procedure: FLEXIBLE SIGMOIDOSCOPY;  Surgeon: [name], MD;   [facility];  Service: Endoscopy;  Laterality: N/A;  colonoscopy aboerted due to formed stool and poor prep   FOOT SURGERY     KNEE ARTHROSCOPY WITH MEDIAL MENISECTOMY Left 05/06/2020   Procedure: KNEE ARTHROSCOPY WITH MEDIAL MENISCECTOMY AND LATERAL MENISCECTOMY;  Surgeon: [name], MD;  Location: [facility];  Service: Orthopedics;  Laterality: Left;   Lower ext duplex doppler  03/14/14   Rt ABI 1.2   LOWER EXTREMITY ANGIOGRAM Bilateral 02/18/2014   Procedure: LOWER EXTREMITY ANGIOGRAM;  Surgeon: [name], MD;  Location: [facility] CATH LAB;  Service: Cardiovascular;  Laterality: Bilateral;   LOWER EXTREMITY ANGIOGRAM N/A 10/31/2014   Procedure: LOWER EXTREMITY ANGIOGRAM;  Surgeon: [name], MD;  Location: [facility] CATH LAB;  Service: Cardiovascular;  Laterality: N/A;   MALONEY DILATION N/A 10/11/2019   Procedure: Venia Minks DILATION;  Surgeon: [name], MD;  [facility];  Service: Endoscopy;  Laterality: N/A;   MULTIPLE TOOTH EXTRACTIONS     PV angiogram  02/18/2014   tibial vessel diseas bil.   SPINE SURGERY     tendon achillies lengthing and sesamoid      Family History  Problem Relation Age of Onset   Heart disease Mother    Hypertension Mother    Heart attack Mother    Hypertension Father    Diabetes Father    Diabetes Son    Heart disease Son    Hypertension Son    Colon cancer Neg Hx     Social History   Socioeconomic History   Marital status: Married    Spouse name: [name]    Number of children: 4   Years of education: college   Highest education level: Not on file  Occupational History   Occupation: Firefighter   Tobacco Use   Smoking status: Former    Types: Pipe    Quit date: 07/08/1977    Years since quitting: 43.8   Smokeless tobacco: Never  Vaping Use   Vaping Use: Never used  Substance and Sexual Activity   Alcohol use: No    Alcohol/week: 0.0 standard drinks   Drug use: No   Sexual activity: Not on file  Other  Topics Concern   Not on file  Social History Narrative   Retired from [employer] social research officer -[employer]      Lives with [name]    Cat: Jazz      Enjoy: sleeping a lot       Diet: eats all food groups -chicken, steak-chopped, mostly veggie   Caffeine: coffee and soda "a lot"   Water: 3-4 cups daily      Wears seat belt   Does not use phone while driving    Smoke detectors at home    weapons at home  Social Determinants of Health   Financial Resource Strain: Not on file  Food Insecurity: No Food Insecurity   Worried About Charity fundraiser in the Last Year: Never true   Ran Out of Food in the Last Year: Never true  Transportation Needs: No Transportation Needs   Lack of Transportation (Medical): No   Lack of Transportation (Non-Medical): No  Physical Activity: Not on file  Stress: Not on file  Social Connections: Moderately Isolated   Frequency of Communication with Friends and Family: Three times a week   Frequency of Social Gatherings with Friends and Family: Three times a week   Attends Religious Services: Never   Active Member of Clubs or Organizations: No   Attends Archivist Meetings: Never   Marital Status: Married  Human resources officer Violence: Not on file    Outpatient Medications Prior to Visit  Medication Sig Dispense Refill   aspirin EC 81 MG EC tablet Take 1 tablet (81 mg total) by mouth daily.     b complex vitamins tablet Take 1 tablet by mouth daily.     carvedilol (COREG) 3.125 MG tablet Take 0.5 tablets (1.5625 mg total) by mouth 2 (two) times daily with a meal. 180 tablet 3   Cholecalciferol (VITAMIN D3) 5000 units TABS Take 5,000 Units by mouth 2 (two) times daily.      clopidogrel (PLAVIX) 75 MG tablet TAKE ONE (1) TABLET BY MOUTH EVERY DAY 90 tablet 0   Continuous Blood Gluc Receiver (FREESTYLE LIBRE 14 DAY READER) DEVI      Continuous Blood Gluc Sensor (FREESTYLE LIBRE 14 DAY SENSOR) MISC Inject 1 each into the skin every 14 (fourteen)  days. 2 each 5   Cyanocobalamin 1500 MCG TBDP Take 1,500 mcg by mouth daily.      doxycycline (VIBRA-TABS) 100 MG tablet Take 1 tablet (100 mg total) by mouth 2 (two) times daily. 20 tablet 0   DULoxetine (CYMBALTA) 60 MG capsule Take 1 capsule (60 mg total) by mouth daily. 90 capsule 1   empagliflozin (JARDIANCE) 25 MG TABS tablet Take 1 tablet (25 mg total) by mouth daily before breakfast. 90 tablet 1   furosemide (LASIX) 40 MG tablet Take 1 tablet (40 mg total) by mouth daily.     levothyroxine (SYNTHROID) 88 MCG tablet Take 1 tablet (88 mcg total) by mouth daily. 90 tablet 3   linagliptin (TRADJENTA) 5 MG TABS tablet Take 1 tablet (5 mg total) by mouth daily. 30 tablet    Multiple Vitamin (MULTIVITAMIN WITH MINERALS) TABS tablet Take 1 tablet by mouth daily.     nitroGLYCERIN (NITROSTAT) 0.4 MG SL tablet Place 1 tablet (0.4 mg total) under the tongue every 5 (five) minutes as needed for chest pain. 25 tablet 3   pantoprazole (PROTONIX) 40 MG tablet TAKE ONE (1) TABLET BY MOUTH EVERY DAY 30 tablet 11   potassium chloride (KLOR-CON) 10 MEQ tablet TAKE ONE TABLET (10MEQ TOTAL) BY MOUTH DAILY 30 tablet 0   rosuvastatin (CRESTOR) 20 MG tablet Take 1 tablet (20 mg total) by mouth daily. 90 tablet 1   UNABLE TO FIND Home health supplies for CPAP 1 Product 0   UNABLE TO FIND [place] sock aid to help with support hose. Size XL.  Dx:I73.9, G25.81, R60.9 1 each 0   No facility-administered medications prior to visit.    Allergies  Allergen Reactions   Codeine Nausea And Vomiting   Tramadol Nausea Only    Review of Systems  Constitutional:  Positive  for fatigue. Negative for chills and fever.  HENT: Negative.    Respiratory:  Positive for cough and shortness of breath.        Productive cough  Cardiovascular: Negative.       Objective:    Physical Exam Constitutional:      Appearance: He is well-developed.  Cardiovascular:     Rate and Rhythm: Normal rate and regular rhythm.   Pulmonary:     Effort: Pulmonary effort is normal.     Breath sounds: Examination of the right-middle field reveals rhonchi. Rhonchi present. No decreased breath sounds.     Comments: RR=20; rhonchi heard loudest to anterior right mid-chest Neurological:     Mental Status: He is alert.    BP 110/66 (BP Location: Right Arm, Patient Position: Sitting, Cuff Size: Large)   Pulse 90   Temp (!) 97.1 F (36.2 C) (Temporal)   Ht _0  (1.753 m)   Wt 161 lb (73 kg)   SpO2 95%   BMI 23.78 kg/m  Wt Readings from Last 3 Encounters:  04/29/21 161 lb (73 kg)  04/23/21 193 lb (87.5 kg)  04/08/21 169 lb 9.6 oz (76.9 kg)    Health Maintenance Due  Topic Date Due   OPHTHALMOLOGY EXAM  02/07/2021   INFLUENZA VACCINE  04/20/2021    There are no preventive care reminders to display for this patient.   Lab Results  Component Value Date   TSH 2.445 02/17/2021   Lab Results  Component Value Date   WBC 10.9 (H) 04/03/2021   HGB 9.8 (L) 04/03/2021   HCT 30.3 (L) 04/03/2021   MCV 103.1 (H) 04/03/2021   PLT 218 04/03/2021   Lab Results  Component Value Date   NA 137 04/03/2021   K 4.0 04/03/2021   CO2 27 04/03/2021   GLUCOSE 95 04/03/2021   BUN 12 04/03/2021   CREATININE 0.62 04/03/2021   BILITOT 0.8 04/02/2021   ALKPHOS 52 04/02/2021   AST 16 04/02/2021   ALT 13 04/02/2021   PROT 6.9 04/02/2021   ALBUMIN 3.9 04/02/2021   CALCIUM 8.3 (L) 04/03/2021   ANIONGAP 6 04/03/2021   EGFR 93 03/18/2021   Lab Results  Component Value Date   CHOL 149 01/26/2021   Lab Results  Component Value Date   HDL 46 01/26/2021   Lab Results  Component Value Date   LDLCALC 88 01/26/2021   Lab Results  Component Value Date   TRIG 78 01/26/2021   Lab Results  Component Value Date   CHOLHDL 2.8 09/25/2020   Lab Results  Component Value Date   HGBA1C 9.6 (H) 01/26/2021       Assessment & Plan:   Problem List Items Addressed This Visit       Respiratory   Pneumonia - Primary     -doesn't meet admission criteria per CURB-65; he is stable -still has significant rhonchi in right lung fields -finishing doxycycline -will get sputum culture; may change abd based on results -referral to pulmonology       Relevant Orders   CBC with Differential/Platelet   CMP14+EGFR   Respiratory or Resp and Sputum Culture   Ambulatory referral to Pulmonology     Other   Fatigue    -likely related to his PNA -he had a recent fall, and he is taking PT -discussed that he should consider going to hospital if he feels like it is unsafe for him to stay at home -he states that he is able to  eat and ambulate at home, and he would prefer to stay at home          No orders of the defined types were placed in this encounter.    Noreene Larsson, NP

## 2021-04-30 ENCOUNTER — Ambulatory Visit (HOSPITAL_COMMUNITY): Payer: HMO | Admitting: Occupational Therapy

## 2021-04-30 ENCOUNTER — Ambulatory Visit (HOSPITAL_COMMUNITY): Payer: HMO

## 2021-04-30 ENCOUNTER — Encounter (HOSPITAL_COMMUNITY): Payer: Self-pay

## 2021-04-30 ENCOUNTER — Encounter (HOSPITAL_COMMUNITY): Payer: Self-pay | Admitting: Occupational Therapy

## 2021-04-30 DIAGNOSIS — M6281 Muscle weakness (generalized): Secondary | ICD-10-CM | POA: Diagnosis not present

## 2021-04-30 DIAGNOSIS — G8929 Other chronic pain: Secondary | ICD-10-CM

## 2021-04-30 DIAGNOSIS — R2681 Unsteadiness on feet: Secondary | ICD-10-CM

## 2021-04-30 DIAGNOSIS — M25612 Stiffness of left shoulder, not elsewhere classified: Secondary | ICD-10-CM

## 2021-04-30 DIAGNOSIS — R29898 Other symptoms and signs involving the musculoskeletal system: Secondary | ICD-10-CM

## 2021-04-30 DIAGNOSIS — R262 Difficulty in walking, not elsewhere classified: Secondary | ICD-10-CM

## 2021-04-30 LAB — CMP14+EGFR
ALT: 9 IU/L (ref 0–44)
AST: 12 IU/L (ref 0–40)
Albumin/Globulin Ratio: 1.5 (ref 1.2–2.2)
Albumin: 4.3 g/dL (ref 3.7–4.7)
Alkaline Phosphatase: 72 IU/L (ref 44–121)
BUN/Creatinine Ratio: 12 (ref 10–24)
BUN: 9 mg/dL (ref 8–27)
Bilirubin Total: 0.4 mg/dL (ref 0.0–1.2)
CO2: 21 mmol/L (ref 20–29)
Calcium: 9.3 mg/dL (ref 8.6–10.2)
Chloride: 100 mmol/L (ref 96–106)
Creatinine, Ser: 0.74 mg/dL — ABNORMAL LOW (ref 0.76–1.27)
Globulin, Total: 2.8 g/dL (ref 1.5–4.5)
Glucose: 140 mg/dL — ABNORMAL HIGH (ref 65–99)
Potassium: 4.3 mmol/L (ref 3.5–5.2)
Sodium: 139 mmol/L (ref 134–144)
Total Protein: 7.1 g/dL (ref 6.0–8.5)
eGFR: 93 mL/min/{1.73_m2} (ref >59)

## 2021-04-30 LAB — CBC WITH DIFFERENTIAL/PLATELET
Basophils Absolute: 0 10*3/uL (ref 0.0–0.2)
Basos: 1 %
EOS (ABSOLUTE): 0.1 10*3/uL (ref 0.0–0.4)
Eos: 2 %
Hematocrit: 34.4 % — ABNORMAL LOW (ref 37.5–51.0)
Hemoglobin: 11.1 g/dL — ABNORMAL LOW (ref 13.0–17.7)
Immature Grans (Abs): 0 10*3/uL (ref 0.0–0.1)
Immature Granulocytes: 0 %
Lymphocytes Absolute: 1 10*3/uL (ref 0.7–3.1)
Lymphs: 16 %
MCH: 31.8 pg (ref 26.6–33.0)
MCHC: 32.3 g/dL (ref 31.5–35.7)
MCV: 99 fL — ABNORMAL HIGH (ref 79–97)
Monocytes Absolute: 0.4 10*3/uL (ref 0.1–0.9)
Monocytes: 6 %
Neutrophils Absolute: 5 10*3/uL (ref 1.4–7.0)
Neutrophils: 75 %
Platelets: 216 10*3/uL (ref 150–450)
RBC: 3.49 x10E6/uL — ABNORMAL LOW (ref 4.14–5.80)
RDW: 14.5 % (ref 11.6–15.4)
WBC: 6.6 10*3/uL (ref 3.4–10.8)

## 2021-04-30 NOTE — Progress Notes (Signed)
Anemia is improving, and WBC are back in the normal range. No issues with electrolytes. Blood sugar was elevated, but labs weren't fasting, so that doesn't count. We will see if you feel better in a week.

## 2021-04-30 NOTE — Therapy (Signed)
[facility] [address] [place], [place], [address] Phone: [phone]   Fax:  [phone]  Occupational Therapy Evaluation  Patient Details  Name: [name] MRN: [id number] Date of Birth: 12-Sep-1946 Referring Provider (OT): Demetrius Revel, NP   Encounter Date: 04/30/2021   OT End of Session - 04/30/21 1002     Visit Number 1    Number of Visits 8    Date for OT Re-Evaluation 05/30/21    Authorization Type [employer]; $30 copay    Authorization Time Period no visit limit    Progress Note Due on Visit 10    OT Start Time 0903    OT Stop Time 0945    OT Time Calculation (min) 42 min    Activity Tolerance Patient tolerated treatment well    Behavior During Therapy [facility] for tasks assessed/performed             Past Medical History:  Diagnosis Date   Aftercare following surgery of the circulatory system, NEC 12/04/2013   Angina decubitus ([place]) 05/19/2016   Arrhythmia 05/20/2016   Arthritis    ARTHRITIS, RIGHT FOOT 06/26/2008   Qualifier: Diagnosis of  By: Aline Brochure MD, [name]     Cardiomyopathy- EF NL 01/2013, now 30-35% echo 03/14/2012   Carotid artery occlusion    left s/p CEA   Cellulitis of left foot 05/07/2018   CHF (congestive heart failure) ([place])    Critical lower limb ischemia ([place]) 02/28/2014   Critical limb ischemia    Diabetes mellitus    Elevated troponin 04/11/2016   GERD (gastroesophageal reflux disease)    History of stroke June 2013 03/14/2012   HOH (hard of hearing)    Hypertension    Hypothyroidism    Nonhealing skin ulcer ([place]) 10/28/2014   Obstructive sleep apnea    Orthostatic hypotension    Osteomyelitis ([place])    left great toe   Pain in the chest 05/17/2016   Peripheral arterial disease ([place]), s/p PTA x 2 RLE    nonhealing ulcers bilaterally on each great toe   Pneumonia    PONV (postoperative nausea and vomiting)    Pulmonary nodule 05/17/2016   Restless leg syndrome    Shortness of breath     Stroke [facility]) March 08, 2012   Toe osteomyelitis, left [facility])    Ulcer of great toe, left, with necrosis of bone ([place])    Wears dentures     Past Surgical History:  Procedure Laterality Date   AMPUTATION TOE Left 07/25/2018   Procedure: AMPUTATION TOE INTERPHALANGEAL HALLUX LEFT;  Surgeon: Evelina Bucy, DPM;  Location: [place];  Service: Podiatry;  Laterality: Left;   ANGIOPLASTY  02/28/14   diamond back orbital rotational atherectomy of Rt. tibial   BACK SURGERY     BONE BIOPSY Left 07/25/2018   Procedure: SUPERFICIAL BONE BIOPSY;  Surgeon: Evelina Bucy, DPM;  Location: [place];  Service: Podiatry;  Laterality: Left;   CARDIAC CATHETERIZATION N/A 05/19/2016   Procedure: Right/Left Heart Cath and Coronary Angiography;  Surgeon: Belva Crome, MD;  Location: [place] CV LAB;  Service: Cardiovascular;  Laterality: N/A;   CERVICAL FUSION     ENDARTERECTOMY Left 11/29/2013   Procedure: ENDARTERECTOMY CAROTID;  Surgeon: Serafina Mitchell, MD;  Location: [place];  Service: Vascular;  Laterality: Left;   ESOPHAGOGASTRODUODENOSCOPY  02/2010   Dr. Gala Romney: patient presented with food impaction, schatzki ring with superimposed component of stricture with erosive reflux esophagitis, s/p disimpaction  but dilation planned at later date    ESOPHAGOGASTRODUODENOSCOPY (EGD) WITH PROPOFOL N/A 08/13/2019   Dr. Gala Romney: Erosive reflux esophagitis with mild stricture and incidental Mallory-Weiss tear which precluded esophageal dilation.  Medium sized hiatal hernia.   ESOPHAGOGASTRODUODENOSCOPY (EGD) WITH PROPOFOL N/A 10/11/2019   Dr. Gala Romney: Esophageal stenosis status post dilation, moderate hiatal hernia   EYE SURGERY     FLEXIBLE SIGMOIDOSCOPY N/A 08/13/2019   Procedure: FLEXIBLE SIGMOIDOSCOPY;  Surgeon: [name], MD;  [facility];  Service: Endoscopy;  Laterality: N/A;  colonoscopy aboerted due to formed stool and poor prep   FOOT SURGERY     KNEE ARTHROSCOPY WITH MEDIAL MENISECTOMY Left  05/06/2020   Procedure: KNEE ARTHROSCOPY WITH MEDIAL MENISCECTOMY AND LATERAL MENISCECTOMY;  Surgeon: [name], MD;  [facility];  Service: Orthopedics;  Laterality: Left;   Lower ext duplex doppler  03/14/14   Rt ABI 1.2   LOWER EXTREMITY ANGIOGRAM Bilateral 02/18/2014   Procedure: LOWER EXTREMITY ANGIOGRAM;  Surgeon: [name], MD;  Location: [facility] CATH LAB;  Service: Cardiovascular;  Laterality: Bilateral;   LOWER EXTREMITY ANGIOGRAM N/A 10/31/2014   Procedure: LOWER EXTREMITY ANGIOGRAM;  Surgeon: [name], MD;  Location: [facility] CATH LAB;  Service: Cardiovascular;  Laterality: N/A;   MALONEY DILATION N/A 10/11/2019   Procedure: Venia Minks DILATION;  Surgeon: [name], MD;  [facility];  Service: Endoscopy;  Laterality: N/A;   MULTIPLE TOOTH EXTRACTIONS     PV angiogram  02/18/2014   tibial vessel diseas bil.   SPINE SURGERY     tendon achillies lengthing and sesamoid      There were no vitals filed for this visit.   Subjective Assessment - 04/30/21 0949     Subjective  S: I keep falling on this shoulder.    Pertinent History Pt is a 75 y/o male presenting with chronic left shoulder pain that has been worsening over the past 2 years. Pt has had multiple falls and has fallen on his left arm several times. Pt was referred to occupational therapy for evaluation and treatment by Demetrius Revel, NP.    Special Tests FOTO: 47/100    Patient Stated Goals To improve ability to use his left arm for reaching.    Currently in Pain? No/denies               [facility] OT Assessment - 04/30/21 [id number]       Assessment   Medical Diagnosis chronic left shoulder pain    Referring Provider (OT) Demetrius Revel, NP    Onset Date/Surgical Date --   approximately 2 years   Hand Dominance Left    Next MD Visit 05/12/2021    Prior Therapy None for this condition      Precautions   Precautions Fall      Restrictions   Weight Bearing Restrictions No      Balance Screen    Has the patient fallen in the past 6 months Yes    How many times? 5    Has the patient had a decrease in activity level because of a fear of falling?  Yes    Is the patient reluctant to leave their home because of a fear of falling?  Yes      Prior Function   Level of Independence Independent with basic ADLs;Independent with household mobility with device    Vocation Retired    Leisure playing on computer      ADL  ADL comments Pt is having difficulty with dressing, reaching overhead, reaching behind back, reaching forward to complete tasks. Pt is having difficulty sleeping due to pain. Pt is having difficulty with lifting pots/pans, especially cast iron skillet.      Written Expression   Dominant Hand Left      Cognition   Overall Cognitive Status Within Functional Limits for tasks assessed      Observation/Other Assessments   Focus on Therapeutic Outcomes (FOTO)  47/100      ROM / Strength   AROM / PROM / Strength AROM;Strength;PROM      Palpation   Palpation comment mod fascial restrictions along left upper, trapezius regions      AROM   Overall AROM Comments Assessed seated, er/IR adducted    AROM Assessment Site Shoulder    Right/Left Shoulder Left    Left Shoulder Flexion 108 Degrees    Left Shoulder ABduction 110 Degrees    Left Shoulder Internal Rotation 90 Degrees    Left Shoulder External Rotation 11 Degrees      PROM   Overall PROM Comments Assessed seated, er/IR adducted    PROM Assessment Site Shoulder    Right/Left Shoulder Left    Left Shoulder Flexion 118 Degrees    Left Shoulder ABduction 120 Degrees    Left Shoulder Internal Rotation 90 Degrees    Left Shoulder External Rotation 20 Degrees      Strength   Overall Strength Comments Assessed seated, er/IR adducted    Strength Assessment Site Shoulder    Right/Left Shoulder Left    Left Shoulder Flexion 3-/5    Left Shoulder ABduction 3/5    Left Shoulder Internal Rotation 3/5    Left Shoulder  External Rotation 3-/5                             OT Education - 04/30/21 1002     Education Details educated on POC    Person(s) Educated Patient    Methods Explanation    Comprehension Verbalized understanding              OT Short Term Goals - 04/30/21 1007       OT SHORT TERM GOAL #1   Title Pt will be provided with and educated on HEP to improve mobility of LUE required during ADL completion.    Time 4    Period Weeks    Status New    Target Date 05/30/21      OT SHORT TERM GOAL #2   Title Pt will decrease pain in LUE to 3/10 or less to improve ability to sleep for 3+ hours without waking due to pain.    Time 4    Period Weeks    Status New      OT SHORT TERM GOAL #3   Title Pt will decrease LUE fascial restrictions to min amounts or less to improve ability to complete functional reaching tasks.    Time 4    Period Weeks    Status New      OT SHORT TERM GOAL #4   Title Pt will increase LUE A/ROM to [facility] to improve ability to reach for clothes or items in closet.    Time 4    Period Weeks    Status New      OT SHORT TERM GOAL #5   Title Pt will increase LUE strength to 4/5 or greater to improve ability  to lift cast iron skillet during meal prep tasks.    Time 4    Period Weeks    Status New                      Plan - 04/30/21 1004     Clinical Impression Statement A: Pt is a 75 y/o male presenting with chronic left shoulder pain secondary to multiple falls on the LUE. Pt with limitations impacting functional use of dominant LUE during ADLs and leisure tasks. No HEP issued at evaluation due to time constraints.    OT Occupational Profile and History Problem Focused Assessment - Including review of records relating to presenting problem    Occupational performance deficits (Please refer to evaluation for details): ADL's;IADL's;Rest and Sleep;Leisure    Body Structure / Function / Physical Skills ADL;Endurance;UE functional  use;Fascial restriction;Pain;ROM;IADL;Strength    Rehab Potential Good    Clinical Decision Making Limited treatment options, no task modification necessary    Comorbidities Affecting Occupational Performance: None    Modification or Assistance to Complete Evaluation  No modification of tasks or assist necessary to complete eval    OT Frequency 2x / week    OT Duration 8 weeks    OT Treatment/Interventions Self-care/ADL training;Ultrasound;DME and/or AE instruction;Patient/family education;Passive range of motion;Cryotherapy;Electrical Stimulation;Moist Heat;Therapeutic exercise;Manual Therapy;Therapeutic activities    Plan P: Pt will benefit from skilled OT services to decrease pain and fascial restrictions and increase joint ROM, strength, and functional use of LUE. Treatment plan: Myofascial release and manual techniques, P/ROM, AA/ROM, A/ROM, general LUE strengthning, scapular mobility/stability/strengthening, modalities prn    Consulted and Agree with Plan of Care Patient             Patient will benefit from skilled therapeutic intervention in order to improve the following deficits and impairments:   Body Structure / Function / Physical Skills: ADL, Endurance, UE functional use, Fascial restriction, Pain, ROM, IADL, Strength       Visit Diagnosis: Chronic left shoulder pain  Other symptoms and signs involving the musculoskeletal system  Stiffness of left shoulder, not elsewhere classified    Problem List Patient Active Problem List   Diagnosis Date Noted   Fatigue 04/29/2021   Recent unexplained weight loss 04/23/2021   Left shoulder pain 04/23/2021   Chest tightness 04/23/2021   Deep tissue injury 04/23/2021   Pneumonia 04/03/2021   PNA (pneumonia) 04/02/2021   Abnormal finding on urinalysis 04/01/2021   Syncope 02/16/2021   Minor head injury    Right hip pain 02/11/2021   Erectile dysfunction 01/22/2021   Gait abnormality 09/30/2020   Mild non proliferative  diabetic retinopathy ([place]) 07/16/2020   Need for immunization against influenza 06/03/2020   S/P left knee arthroscopy 05/06/20 05/13/2020   Abnormal MRI 03/26/2020   Joint disorder of knee 03/26/2020   Fall at home, initial encounter 03/13/2020   Anemia 09/03/2019   Reflux esophagitis 09/03/2019   Esophageal dysphagia 05/30/2019   Constipation 05/30/2019   Positive colorectal cancer screening using Cologuard test 05/30/2019   Diabetic foot ulcer ([place]) 09/06/2018   Orthostasis 08/25/2016   Congestive heart failure ([place]) 05/17/2016   Bilateral carotid artery disease ([place]) 07/08/2014   Obstructive sleep apnea 02/12/2014   Restless legs 10/23/2013   Overweight with body mass index (BMI) of 29 to 29.9 in adult 10/23/2013   Hyperlipidemia 07/16/2013   Peripheral arterial disease ([place]) 07/16/2013   Hypertension 03/14/2012   Type 2 diabetes mellitus with circulatory disorder ([place]) 03/14/2012  Peripheral neuropathy 03/14/2012   Cardiomyopathy- EF NL 01/2013, now 30-35% echo 03/14/2012    Guadelupe Sabin, OTR/L  [phone] 04/30/2021, 10:10 AM  [place], [place], [address] Phone: [phone]   Fax:  [phone]  Name: [name] MRN: [id number] Date of Birth: 06/23/1946

## 2021-04-30 NOTE — Therapy (Signed)
[facility] [address] [place], [place], [address] Phone: [phone]   Fax:  [phone]  Physical Therapy Treatment  Patient Details  Name: [name] MRN: [id number] Date of Birth: 1946/08/23 Referring Provider (PT): Noreene Larsson, NP   Encounter Date: 04/30/2021   PT End of Session - 04/30/21 1038     Visit Number 6    Number of Visits 12    Date for PT Re-Evaluation 05/18/21    Authorization Type [employer], no VL, no auth ,    Progress Note Due on Visit 10    PT Start Time 1032    PT Stop Time 1115    PT Time Calculation (min) 43 min    Equipment Utilized During Treatment Gait belt    Activity Tolerance Patient tolerated treatment well    Behavior During Therapy [facility] for tasks assessed/performed             Past Medical History:  Diagnosis Date   Aftercare following surgery of the circulatory system, NEC 12/04/2013   Angina decubitus ([place]) 05/19/2016   Arrhythmia 05/20/2016   Arthritis    ARTHRITIS, RIGHT FOOT 06/26/2008   Qualifier: Diagnosis of  By: Aline Brochure MD, [name]     Cardiomyopathy- EF NL 01/2013, now 30-35% echo 03/14/2012   Carotid artery occlusion    left s/p CEA   Cellulitis of left foot 05/07/2018   CHF (congestive heart failure) ([place])    Critical lower limb ischemia ([place]) 02/28/2014   Critical limb ischemia    Diabetes mellitus    Elevated troponin 04/11/2016   GERD (gastroesophageal reflux disease)    History of stroke June 2013 03/14/2012   HOH (hard of hearing)    Hypertension    Hypothyroidism    Nonhealing skin ulcer ([place]) 10/28/2014   Obstructive sleep apnea    Orthostatic hypotension    Osteomyelitis ([place])    left great toe   Pain in the chest 05/17/2016   Peripheral arterial disease ([place]), s/p PTA x 2 RLE    nonhealing ulcers bilaterally on each great toe   Pneumonia    PONV (postoperative nausea and vomiting)    Pulmonary nodule 05/17/2016   Restless leg syndrome     Shortness of breath    Stroke [facility]) March 08, 2012   Toe osteomyelitis, left [facility])    Ulcer of great toe, left, with necrosis of bone ([place])    Wears dentures     Past Surgical History:  Procedure Laterality Date   AMPUTATION TOE Left 07/25/2018   Procedure: AMPUTATION TOE INTERPHALANGEAL HALLUX LEFT;  Surgeon: Evelina Bucy, DPM;  Location: [place];  Service: Podiatry;  Laterality: Left;   ANGIOPLASTY  02/28/14   diamond back orbital rotational atherectomy of Rt. tibial   BACK SURGERY     BONE BIOPSY Left 07/25/2018   Procedure: SUPERFICIAL BONE BIOPSY;  Surgeon: Evelina Bucy, DPM;  Location: [place];  Service: Podiatry;  Laterality: Left;   CARDIAC CATHETERIZATION N/A 05/19/2016   Procedure: Right/Left Heart Cath and Coronary Angiography;  Surgeon: Belva Crome, MD;  Location: [place] CV LAB;  Service: Cardiovascular;  Laterality: N/A;   CERVICAL FUSION     ENDARTERECTOMY Left 11/29/2013   Procedure: ENDARTERECTOMY CAROTID;  Surgeon: Serafina Mitchell, MD;  Location: [place];  Service: Vascular;  Laterality: Left;   ESOPHAGOGASTRODUODENOSCOPY  02/2010   Dr. Gala Romney: patient presented with food impaction, schatzki ring with superimposed component of stricture with erosive  reflux esophagitis, s/p disimpaction but dilation planned at later date    ESOPHAGOGASTRODUODENOSCOPY (EGD) WITH PROPOFOL N/A 08/13/2019   Dr. Gala Romney: Erosive reflux esophagitis with mild stricture and incidental Mallory-Weiss tear which precluded esophageal dilation.  Medium sized hiatal hernia.   ESOPHAGOGASTRODUODENOSCOPY (EGD) WITH PROPOFOL N/A 10/11/2019   Dr. Gala Romney: Esophageal stenosis status post dilation, moderate hiatal hernia   EYE SURGERY     FLEXIBLE SIGMOIDOSCOPY N/A 08/13/2019   Procedure: FLEXIBLE SIGMOIDOSCOPY;  Surgeon: [name], MD;  [facility];  Service: Endoscopy;  Laterality: N/A;  colonoscopy aboerted due to formed stool and poor prep   FOOT SURGERY     KNEE ARTHROSCOPY WITH MEDIAL  MENISECTOMY Left 05/06/2020   Procedure: KNEE ARTHROSCOPY WITH MEDIAL MENISCECTOMY AND LATERAL MENISCECTOMY;  Surgeon: [name], MD;  [facility];  Service: Orthopedics;  Laterality: Left;   Lower ext duplex doppler  03/14/14   Rt ABI 1.2   LOWER EXTREMITY ANGIOGRAM Bilateral 02/18/2014   Procedure: LOWER EXTREMITY ANGIOGRAM;  Surgeon: [name], MD;  Location: [facility] CATH LAB;  Service: Cardiovascular;  Laterality: Bilateral;   LOWER EXTREMITY ANGIOGRAM N/A 10/31/2014   Procedure: LOWER EXTREMITY ANGIOGRAM;  Surgeon: [name], MD;  Location: [facility] CATH LAB;  Service: Cardiovascular;  Laterality: N/A;   MALONEY DILATION N/A 10/11/2019   Procedure: Venia Minks DILATION;  Surgeon: [name], MD;  [facility];  Service: Endoscopy;  Laterality: N/A;   MULTIPLE TOOTH EXTRACTIONS     PV angiogram  02/18/2014   tibial vessel diseas bil.   SPINE SURGERY     tendon achillies lengthing and sesamoid      There were no vitals filed for this visit.   Subjective Assessment - 04/30/21 1038     Subjective Pt reports he is feeling better and feels his PNA is clearing up, continues to take abx    Pertinent History CHF , R foot wound, left knee scope, DB, Restless leg syndrome, hx of stroke.    Limitations House hold activities;Standing;Lifting    Currently in Pain? No/denies    Pain Score 0-No pain                               [facility] Adult PT Treatment/Exercise - 04/30/21 0001       Knee/Hip Exercises: Aerobic   Nustep level 5 x 6 min for dynamic warm-up      Knee/Hip Exercises: Machines for Strengthening   Cybex Knee Flexion 5 plates, 3x10      Knee/Hip Exercises: Supine   Short Arc Quad Sets Strengthening;Both;3 sets;10 reps    Short Arc Quad Sets Limitations 10    Hip Adduction Isometric Strengthening;Both;3 sets;10 reps    Bridges with Clamshell Strengthening;Both;3 sets;10 reps   manual resistance   Heel Prop for Knee Extension 2 minutes     Other Supine Knee/Hip Exercises LLE hip abduction 3x10                      PT Short Term Goals - 04/06/21 1026       PT SHORT TERM GOAL #1   Title Patient will be independent in self management strategies to improve quality of life and functional outcomes.    Time 3    Period Weeks    Status New    Target Date 04/27/21      PT SHORT TERM GOAL #2  Title Patient will be able to report at least 25% improvement in overall mobility.    Time 3    Period Weeks    Status New    Target Date 04/27/21      PT SHORT TERM GOAL #3   Title Demo improved balance and BLE strength as evidenced by time of 18 sec 5xSTS test    Baseline 26 sec with BUE support    Time 3    Period Weeks    Status New    Target Date 04/27/21               PT Long Term Goals - 04/06/21 1029       PT LONG TERM GOAL #1   Title Patient will improve FOTO score by at least 10 points in order to indicate improved tolerance to activity.    Baseline 50% function    Time 6    Period Weeks    Status New    Target Date 05/18/21      PT LONG TERM GOAL #2   Title Demo improved gait velocity and safety as evidenced by distance of 150 ft during 2MWT    Baseline 60 ft with rollator    Time 6    Period Weeks    Status New    Target Date 05/18/21                   Plan - 04/30/21 1126     Clinical Impression Statement Able to tolerate increased resistance to left hip exercises today with some discomfort noted in buttocks area as a result. Ambulates with left Trendelenberg which is some improved with cues for increaing upright posture.  Continued sessions indicated to improve LE strength/ROM to enable normalized gait pattern    Personal Factors and Comorbidities Comorbidity 1;Comorbidity 3+;Comorbidity 2    Comorbidities CHF, left knee surgery, right chronic foot wound, DB, restless leg syndromes    Examination-Activity Limitations Bathing;Lift;Locomotion  Level;Transfers;Stand;Stairs;Squat;Reach Overhead;Carry    Examination-Participation Restrictions Community Activity;Meal Prep    Stability/Clinical Decision Making Evolving/Moderate complexity    Rehab Potential Fair    PT Frequency 2x / week    PT Duration 6 weeks    PT Treatment/Interventions ADLs/Self Care Home Management;Other (comment);Moist Heat;Balance training;Therapeutic exercise;Therapeutic activities;Functional mobility training;Stair training;Gait training;DME Instruction;Ultrasound;Neuromuscular re-education;Patient/family education;Orthotic Fit/Training;Manual techniques;Taping;Passive range of motion;Electrical Stimulation    PT Next Visit Plan Continue with BLE strength, balance    PT Home Exercise Plan hip abd/add isometric, QS, hamstring stretch;  04/15/21: Isometric abd, abd supine, SAQ, bridge    Consulted and Agree with Plan of Care Patient             Patient will benefit from skilled therapeutic intervention in order to improve the following deficits and impairments:  Abnormal gait, Decreased endurance, Decreased skin integrity, Increased edema, Decreased activity tolerance, Decreased balance, Decreased mobility, Decreased knowledge of use of DME, Decreased strength, Difficulty walking, Pain, Decreased range of motion, Impaired flexibility, Postural dysfunction, Improper body mechanics  Visit Diagnosis: Muscle weakness (generalized)  Difficulty in walking, not elsewhere classified  Unsteadiness on feet     Problem List Patient Active Problem List   Diagnosis Date Noted   Fatigue 04/29/2021   Recent unexplained weight loss 04/23/2021   Left shoulder pain 04/23/2021   Chest tightness 04/23/2021   Deep tissue injury 04/23/2021   Pneumonia 04/03/2021   PNA (pneumonia) 04/02/2021   Abnormal finding on urinalysis 04/01/2021   Syncope 02/16/2021   Minor  head injury    Right hip pain 02/11/2021   Erectile dysfunction 01/22/2021   Gait abnormality 09/30/2020    Mild non proliferative diabetic retinopathy ([place]) 07/16/2020   Need for immunization against influenza 06/03/2020   S/P left knee arthroscopy 05/06/20 05/13/2020   Abnormal MRI 03/26/2020   Joint disorder of knee 03/26/2020   Fall at home, initial encounter 03/13/2020   Anemia 09/03/2019   Reflux esophagitis 09/03/2019   Esophageal dysphagia 05/30/2019   Constipation 05/30/2019   Positive colorectal cancer screening using Cologuard test 05/30/2019   Diabetic foot ulcer ([place]) 09/06/2018   Orthostasis 08/25/2016   Congestive heart failure ([place]) 05/17/2016   Bilateral carotid artery disease ([place]) 07/08/2014   Obstructive sleep apnea 02/12/2014   Restless legs 10/23/2013   Overweight with body mass index (BMI) of 29 to 29.9 in adult 10/23/2013   Hyperlipidemia 07/16/2013   Peripheral arterial disease ([place]) 07/16/2013   Hypertension 03/14/2012   Type 2 diabetes mellitus with circulatory disorder ([place]) 03/14/2012   Peripheral neuropathy 03/14/2012   Cardiomyopathy- EF NL 01/2013, now 30-35% echo 03/14/2012    Toniann Fail 04/30/2021, 11:27 AM  [place], [place], [address] Phone: [phone]   Fax:  [phone]  Name: OVILA WANGER MRN: [id number] Date of Birth: March 23, 1946

## 2021-05-01 ENCOUNTER — Ambulatory Visit (INDEPENDENT_AMBULATORY_CARE_PROVIDER_SITE_OTHER): Payer: HMO | Admitting: Internal Medicine

## 2021-05-01 ENCOUNTER — Encounter: Payer: Self-pay | Admitting: Internal Medicine

## 2021-05-01 ENCOUNTER — Other Ambulatory Visit: Payer: Self-pay

## 2021-05-01 DIAGNOSIS — J189 Pneumonia, unspecified organism: Secondary | ICD-10-CM

## 2021-05-01 MED ORDER — AMOXICILLIN-POT CLAVULANATE 875-125 MG PO TABS: 1.0000 | ORAL_TABLET | Freq: Two times a day (BID) | ORAL | 0 refills | Status: AC

## 2021-05-01 NOTE — Assessment & Plan Note (Signed)
Onset symptoms ? In may 2022, much worse since mid July rx doxy  - sill L flank pain, discolored sputum 05/01/2021 > rec augmentin x 14 days and then repeat cxr   Hx is not typical for acute CAP and worrisom for chronic recurrent aspiration injury though pt denies obvious aspiration and is edentulous so lowers risk of noct aspiration (when he would not be aware of the problem)   >>> rx as above - if cxr and symptoms resolve in 2 weeks no further f/u warranted, o/w would do MBS and HRCT (looking for bronchiectasis/MAI)           Each maintenance medication was reviewed in detail including emphasizing most importantly the difference between maintenance and prns and under what circumstances the prns are to be triggered using an action plan format where appropriate.  Total time for H and P, chart review, counseling, reviewing CPPD and generating customized AVS unique to this office visit / same day charting =48 min

## 2021-05-01 NOTE — Patient Instructions (Addendum)
Stop the dox and start Augmentin 875 mg take one pill twice daily  X 14  days - take at breakfast and supper with large glass of water.  It would help reduce the usual side effects (diarrhea and yeast infections) if you ate cultured yogurt at lunch.   For cough / congestion mucinex 1200 mg every 12 hours as needed and do postural drainage while lying on your belly with chest percussion on the bottom of your lungs -9*   We will set up a follow up cxr in 2 weeks at [facility] and I will call you the results

## 2021-05-01 NOTE — Progress Notes (Signed)
[name], male    DOB: May 27, 1946     MRN: [id number]   Brief patient profile:  57 yowm never regular smoker with new onset L flank pain around 01/2021 > much more severe abruptly 04/02/21 >> ER eval with renal ct showing LLL as dz > doxy rx some better but still having pain/ discolored mucus so repeat doxy started  04/29/21  and referred to pulmonary clinic in [place]  05/01/2021 by  Demetrius Revel      History of Present Illness  05/01/2021  Pulmonary/ 1st office eval/ Adelise Buswell / [place] Office  Chief Complaint  Patient presents with   Pulmonary Consult    Referred by Donneta Romberg, NP. Pt states recently dx with pna. He has prod cough with light brown sputum. He states 50 lb wt loss since Spring 2021- unintentional.   Dyspnea:  limited by balance s/p cva  Cough: was brown mucus, much less dark, still L flank pain with cough Sleep: on side / bed is flat  SABA use: none  No problem swallowing per pt/ no recent dental concerns (edentulous)   No obvious day to day or daytime variability or assoc  mucus plugs or hemoptysis  or chest tightness, subjective wheeze or overt sinus or hb symptoms.   Sleeping as above without nocturnal  or early am exacerbation  of respiratory  c/o's or need for noct saba. Also denies any obvious fluctuation of symptoms with weather or environmental changes or other aggravating or alleviating factors except as outlined above   No unusual exposure hx or h/o childhood pna/ asthma or knowledge of premature birth.  Current Allergies, Complete Past Medical History, Past Surgical History, Family History, and Social History were reviewed in [employer] record.  ROS  The following are not active complaints unless bolded Hoarseness, sore throat, dysphagia, dental problems, itching, sneezing,  nasal congestion or discharge of excess mucus or purulent secretions, ear ache,   fever, chills, sweats, unintended wt loss or wt gain, classically  pleuritic or exertional cp,  orthopnea pnd or arm/hand swelling  or leg swelling, presyncope, palpitations, abdominal pain, anorexia, nausea, vomiting, diarrhea  or change in bowel habits or change in bladder habits, change in stools or change in urine, dysuria, hematuria,  rash, arthralgias, visual complaints, headache, numbness, weakness or ataxia or problems with walking or coordination,  change in mood or  memory.           Past Medical History:  Diagnosis Date   Aftercare following surgery of the circulatory system, NEC 12/04/2013   Angina decubitus ([place]) 05/19/2016   Arrhythmia 05/20/2016   Arthritis    ARTHRITIS, RIGHT FOOT 06/26/2008   Qualifier: Diagnosis of  By: Aline Brochure MD, [name]     Cardiomyopathy- EF NL 01/2013, now 30-35% echo 03/14/2012   Carotid artery occlusion    left s/p CEA   Cellulitis of left foot 05/07/2018   CHF (congestive heart failure) ([place])    Critical lower limb ischemia ([place]) 02/28/2014   Critical limb ischemia    Diabetes mellitus    Elevated troponin 04/11/2016   GERD (gastroesophageal reflux disease)    History of stroke June 2013 03/14/2012   HOH (hard of hearing)    Hypertension    Hypothyroidism    Nonhealing skin ulcer ([place]) 10/28/2014   Obstructive sleep apnea    Orthostatic hypotension    Osteomyelitis (H[facility])    left great toe   Pain in the chest 05/17/2016   Peripheral arterial  disease ([place]), s/p PTA x 2 RLE    nonhealing ulcers bilaterally on each great toe   Pneumonia    PONV (postoperative nausea and vomiting)    Pulmonary nodule 05/17/2016   Restless leg syndrome    Shortness of breath    Stroke [facility]) March 08, 2012   Toe osteomyelitis, left [facility])    Ulcer of great toe, left, with necrosis of bone (H[facility])    Wears dentures     Outpatient Medications Prior to Visit  Medication Sig Dispense Refill   aspirin EC 81 MG EC tablet Take 1 tablet (81 mg total) by mouth daily.     b complex vitamins tablet Take 1 tablet by mouth daily.     carvedilol  (COREG) 3.125 MG tablet Take 0.5 tablets (1.5625 mg total) by mouth 2 (two) times daily with a meal. 180 tablet 3   Cholecalciferol (VITAMIN D3) 5000 units TABS Take 5,000 Units by mouth 2 (two) times daily.      clopidogrel (PLAVIX) 75 MG tablet TAKE ONE (1) TABLET BY MOUTH EVERY DAY 90 tablet 0   Continuous Blood Gluc Receiver (FREESTYLE LIBRE 14 DAY READER) DEVI      Continuous Blood Gluc Sensor (FREESTYLE LIBRE 14 DAY SENSOR) MISC Inject 1 each into the skin every 14 (fourteen) days. 2 each 5   Cyanocobalamin 1500 MCG TBDP Take 1,500 mcg by mouth daily.      doxycycline (VIBRA-TABS) 100 MG tablet Take 1 tablet (100 mg total) by mouth 2 (two) times daily. 20 tablet 0   DULoxetine (CYMBALTA) 60 MG capsule Take 1 capsule (60 mg total) by mouth daily. 90 capsule 1   empagliflozin (JARDIANCE) 25 MG TABS tablet Take 1 tablet (25 mg total) by mouth daily before breakfast. 90 tablet 1   furosemide (LASIX) 40 MG tablet Take 1 tablet (40 mg total) by mouth daily.     levothyroxine (SYNTHROID) 88 MCG tablet Take 1 tablet (88 mcg total) by mouth daily. 90 tablet 3   linagliptin (TRADJENTA) 5 MG TABS tablet Take 1 tablet (5 mg total) by mouth daily. 30 tablet    Multiple Vitamin (MULTIVITAMIN WITH MINERALS) TABS tablet Take 1 tablet by mouth daily.     nitroGLYCERIN (NITROSTAT) 0.4 MG SL tablet Place 1 tablet (0.4 mg total) under the tongue every 5 (five) minutes as needed for chest pain. 25 tablet 3   pantoprazole (PROTONIX) 40 MG tablet TAKE ONE (1) TABLET BY MOUTH EVERY DAY 30 tablet 11   potassium chloride (KLOR-CON) 10 MEQ tablet TAKE ONE TABLET (10MEQ TOTAL) BY MOUTH DAILY 30 tablet 0   rosuvastatin (CRESTOR) 20 MG tablet Take 1 tablet (20 mg total) by mouth daily. 90 tablet 1   UNABLE TO FIND Home health supplies for CPAP 1 Product 0   UNABLE TO FIND [place] sock aid to help with support hose. Size XL.  Dx:I73.9, G25.81, R60.9 1 each 0   No facility-administered medications prior to visit.      Objective:     BP 106/60 (BP Location: Left Arm, Cuff Size: Normal)   Pulse 76   Temp (!) 97.3 F (36.3 C) (Oral)   Ht '5\' 9"'$  (1.753 m)   Wt 161 lb (73 kg)   SpO2 97% Comment: on RA  BMI 23.78 kg/m   SpO2: 97 % (on RA)  Pleasant ederly wm looks a lot older than stated age   HEENT : pt wearing mask not removed for exam due to covid -19 concerns. But  reports edentulous   NECK :  without JVD/Nodes/TM/ nl carotid upstrokes bilaterally   LUNGS: no acc muscle use,  Nl contour chest with junky insp> exp rhonchi L >R    without cough on insp or exp maneuvers   CV:  RRR  no s3 or murmur or increase in P2, and no edema   ABD:  soft and nontender with nl inspiratory excursion in the supine position. No bruits or organomegaly appreciated, bowel sounds nl  MS:  Nl gait/ ext warm without deformities, calf tenderness, cyanosis or clubbing No obvious joint restrictions   SKIN: warm and dry without lesions    NEURO:  alert, approp, nl sensorium with very awkward slow gait   I personally reviewed images and agree with radiology impression as follows:   Chest CT cuts on renal ct 04/02/21 Ground-glass opacities in the dependent left lower lobe and lingula with a more consolidative area in the lingula and patchy ground-glass opacities with interlobular septal thickening involving the right middle lobe and right lower lobe. Findings which are likely infectious or inflammatory.  Lab Results  Component Value Date   WBC 6.6 04/29/2021   HGB 11.1 (L) 04/29/2021   HCT 34.4 (L) 04/29/2021   MCV 99 (H) 04/29/2021   PLT 216 04/29/2021      Eos                                                       0.1                             05/01/2021     Assessment   PNA (pneumonia) Onset symptoms ? In may 2022, much worse since mid July rx doxy  - sill L flank pain, discolored sputum 05/01/2021 > rec augmentin x 14 days and then repeat cxr   Hx is not typical for acute CAP and worrisom for  chronic recurrent aspiration injury though pt denies obvious aspiration and is edentulous so lowers risk of noct aspiration (when he would not be aware of the problem)   >>> rx as above - if cxr and symptoms resolve in 2 weeks no further f/u warranted, o/w would do MBS and HRCT (looking for bronchiectasis/MAI)           Each maintenance medication was reviewed in detail including emphasizing most importantly the difference between maintenance and prns and under what circumstances the prns are to be triggered using an action plan format where appropriate.  Total time for H and P, chart review, counseling, reviewing CPPD and generating customized AVS unique to this office visit / same day charting =48 min          Christinia Gully, MD 05/01/2021

## 2021-05-04 ENCOUNTER — Ambulatory Visit (HOSPITAL_COMMUNITY): Payer: HMO

## 2021-05-04 ENCOUNTER — Encounter (HOSPITAL_COMMUNITY): Payer: Self-pay | Admitting: Physical Therapy

## 2021-05-04 ENCOUNTER — Encounter (HOSPITAL_COMMUNITY): Payer: Self-pay

## 2021-05-04 ENCOUNTER — Other Ambulatory Visit: Payer: Self-pay

## 2021-05-04 ENCOUNTER — Telehealth: Payer: Self-pay

## 2021-05-04 ENCOUNTER — Ambulatory Visit (HOSPITAL_COMMUNITY): Payer: HMO | Admitting: Physical Therapy

## 2021-05-04 DIAGNOSIS — M25612 Stiffness of left shoulder, not elsewhere classified: Secondary | ICD-10-CM

## 2021-05-04 DIAGNOSIS — R262 Difficulty in walking, not elsewhere classified: Secondary | ICD-10-CM

## 2021-05-04 DIAGNOSIS — R29898 Other symptoms and signs involving the musculoskeletal system: Secondary | ICD-10-CM

## 2021-05-04 DIAGNOSIS — G8929 Other chronic pain: Secondary | ICD-10-CM

## 2021-05-04 DIAGNOSIS — M6281 Muscle weakness (generalized): Secondary | ICD-10-CM | POA: Diagnosis not present

## 2021-05-04 DIAGNOSIS — R2681 Unsteadiness on feet: Secondary | ICD-10-CM

## 2021-05-04 NOTE — Patient Instructions (Signed)

## 2021-05-04 NOTE — Telephone Encounter (Signed)
Patient called said the order for wound center needs to be sent to [facility] outpatient wound center on [facility].  Patient said it was sent to [facility].

## 2021-05-04 NOTE — Therapy (Signed)
[facility] [address] [place], [place], [address] Phone: [phone]   Fax:  [phone]  Physical Therapy Treatment  Patient Details  Name: [name] MRN: [id number] Date of Birth: 25-May-1946 Referring Provider (PT): Noreene Larsson, NP   Encounter Date: 05/04/2021   PT End of Session - 05/04/21 1444     Visit Number 7    Number of Visits 12    Date for PT Re-Evaluation 05/18/21    Authorization Type [employer], no VL, no auth ,    Progress Note Due on Visit 10    PT Start Time 1446    PT Stop Time 1525    PT Time Calculation (min) 39 min    Equipment Utilized During Treatment Gait belt    Activity Tolerance Patient tolerated treatment well    Behavior During Therapy [facility] for tasks assessed/performed             Past Medical History:  Diagnosis Date   Aftercare following surgery of the circulatory system, NEC 12/04/2013   Angina decubitus ([place]) 05/19/2016   Arrhythmia 05/20/2016   Arthritis    ARTHRITIS, RIGHT FOOT 06/26/2008   Qualifier: Diagnosis of  By: Aline Brochure MD, [name]     Cardiomyopathy- EF NL 01/2013, now 30-35% echo 03/14/2012   Carotid artery occlusion    left s/p CEA   Cellulitis of left foot 05/07/2018   CHF (congestive heart failure) ([place])    Critical lower limb ischemia ([place]) 02/28/2014   Critical limb ischemia    Diabetes mellitus    Elevated troponin 04/11/2016   GERD (gastroesophageal reflux disease)    History of stroke June 2013 03/14/2012   HOH (hard of hearing)    Hypertension    Hypothyroidism    Nonhealing skin ulcer ([place]) 10/28/2014   Obstructive sleep apnea    Orthostatic hypotension    Osteomyelitis ([place])    left great toe   Pain in the chest 05/17/2016   Peripheral arterial disease ([place]), s/p PTA x 2 RLE    nonhealing ulcers bilaterally on each great toe   Pneumonia    PONV (postoperative nausea and vomiting)    Pulmonary nodule 05/17/2016   Restless leg syndrome     Shortness of breath    Stroke [facility]) March 08, 2012   Toe osteomyelitis, left [facility])    Ulcer of great toe, left, with necrosis of bone ([place])    Wears dentures     Past Surgical History:  Procedure Laterality Date   AMPUTATION TOE Left 07/25/2018   Procedure: AMPUTATION TOE INTERPHALANGEAL HALLUX LEFT;  Surgeon: Evelina Bucy, DPM;  Location: [place];  Service: Podiatry;  Laterality: Left;   ANGIOPLASTY  02/28/14   diamond back orbital rotational atherectomy of Rt. tibial   BACK SURGERY     BONE BIOPSY Left 07/25/2018   Procedure: SUPERFICIAL BONE BIOPSY;  Surgeon: Evelina Bucy, DPM;  Location: [place];  Service: Podiatry;  Laterality: Left;   CARDIAC CATHETERIZATION N/A 05/19/2016   Procedure: Right/Left Heart Cath and Coronary Angiography;  Surgeon: Belva Crome, MD;  Location: [place] CV LAB;  Service: Cardiovascular;  Laterality: N/A;   CERVICAL FUSION     ENDARTERECTOMY Left 11/29/2013   Procedure: ENDARTERECTOMY CAROTID;  Surgeon: Serafina Mitchell, MD;  Location: [place];  Service: Vascular;  Laterality: Left;   ESOPHAGOGASTRODUODENOSCOPY  02/2010   Dr. Gala Romney: patient presented with food impaction, schatzki ring with superimposed component of stricture with erosive  reflux esophagitis, s/p disimpaction but dilation planned at later date    ESOPHAGOGASTRODUODENOSCOPY (EGD) WITH PROPOFOL N/A 08/13/2019   Dr. Gala Romney: Erosive reflux esophagitis with mild stricture and incidental Mallory-Weiss tear which precluded esophageal dilation.  Medium sized hiatal hernia.   ESOPHAGOGASTRODUODENOSCOPY (EGD) WITH PROPOFOL N/A 10/11/2019   Dr. Gala Romney: Esophageal stenosis status post dilation, moderate hiatal hernia   EYE SURGERY     FLEXIBLE SIGMOIDOSCOPY N/A 08/13/2019   Procedure: FLEXIBLE SIGMOIDOSCOPY;  Surgeon: [name], MD;  [facility];  Service: Endoscopy;  Laterality: N/A;  colonoscopy aboerted due to formed stool and poor prep   FOOT SURGERY     KNEE ARTHROSCOPY WITH MEDIAL  MENISECTOMY Left 05/06/2020   Procedure: KNEE ARTHROSCOPY WITH MEDIAL MENISCECTOMY AND LATERAL MENISCECTOMY;  Surgeon: [name], MD;  Location: [facility];  Service: Orthopedics;  Laterality: Left;   Lower ext duplex doppler  03/14/14   Rt ABI 1.2   LOWER EXTREMITY ANGIOGRAM Bilateral 02/18/2014   Procedure: LOWER EXTREMITY ANGIOGRAM;  Surgeon: [name], MD;  Location: [facility] CATH LAB;  Service: Cardiovascular;  Laterality: Bilateral;   LOWER EXTREMITY ANGIOGRAM N/A 10/31/2014   Procedure: LOWER EXTREMITY ANGIOGRAM;  Surgeon: [name], MD;  Location: [facility] CATH LAB;  Service: Cardiovascular;  Laterality: N/A;   MALONEY DILATION N/A 10/11/2019   Procedure: Venia Minks DILATION;  Surgeon: [name], MD;  [facility];  Service: Endoscopy;  Laterality: N/A;   MULTIPLE TOOTH EXTRACTIONS     PV angiogram  02/18/2014   tibial vessel diseas bil.   SPINE SURGERY     tendon achillies lengthing and sesamoid      There were no vitals filed for this visit.   Subjective Assessment - 05/04/21 1451     Subjective States that he is doing what he can but he is not doing his exercises  secondary to his pneumonia.    Pertinent History CHF , R foot wound, left knee scope, DB, Restless leg syndrome, hx of stroke.    Limitations House hold activities;Standing;Lifting    Currently in Pain? No/denies                [facility] PT Assessment - 05/04/21 0001       Assessment   Medical Diagnosis gait and balance.                           [facility] Adult PT Treatment/Exercise - 05/04/21 0001       Knee/Hip Exercises: Stretches   Active Hamstring Stretch Both;3 reps;30 seconds   seated     Knee/Hip Exercises: Seated   Long Arc Quad Both;10 reps   15" holds   Sit to [employer] 2 sets;10 reps;without UE support   elevated surface but with yellow weighted ball     Knee/Hip Exercises: Supine   Bridges 4 sets   6 reps, 5 second rest   Straight Leg Raises 2 sets;10  reps;Both;Strengthening   5" hold at top and slow lower   Other Supine Knee/Hip Exercises hamstring curls on ball 3x5 - slow and controlled    Other Supine Knee/Hip Exercises TKE stretch on ball 2 minutes                      PT Short Term Goals - 04/06/21 1026       PT SHORT TERM GOAL #1   Title Patient will be independent in self management  strategies to improve quality of life and functional outcomes.    Time 3    Period Weeks    Status New    Target Date 04/27/21      PT SHORT TERM GOAL #2   Title Patient will be able to report at least 25% improvement in overall mobility.    Time 3    Period Weeks    Status New    Target Date 04/27/21      PT SHORT TERM GOAL #3   Title Demo improved balance and BLE strength as evidenced by time of 18 sec 5xSTS test    Baseline 26 sec with BUE support    Time 3    Period Weeks    Status New    Target Date 04/27/21               PT Long Term Goals - 04/06/21 1029       PT LONG TERM GOAL #1   Title Patient will improve FOTO score by at least 10 points in order to indicate improved tolerance to activity.    Baseline 50% function    Time 6    Period Weeks    Status New    Target Date 05/18/21      PT LONG TERM GOAL #2   Title Demo improved gait velocity and safety as evidenced by distance of 150 ft during 2MWT    Baseline 60 ft with rollator    Time 6    Period Weeks    Status New    Target Date 05/18/21                   Plan - 05/04/21 1520     Clinical Impression Statement Session focused on strengthening and stretches today. Tolerated this moderately well. Bridges were challenging for patient on this date. Quads and hip flexors continue to limit patient's ability to fully extend knee in standing, but improved knee extension noted after stretches in supine. Left side fatigued quicker than right during session and cues to keep leg straight during SLR was required throughout this exercise.     Personal Factors and Comorbidities Comorbidity 1;Comorbidity 3+;Comorbidity 2    Comorbidities CHF, left knee surgery, right chronic foot wound, DB, restless leg syndromes    Examination-Activity Limitations Bathing;Lift;Locomotion Level;Transfers;Stand;Stairs;Squat;Reach Overhead;Carry    Examination-Participation Restrictions Community Activity;Meal Prep    Stability/Clinical Decision Making Evolving/Moderate complexity    Rehab Potential Fair    PT Frequency 2x / week    PT Duration 6 weeks    PT Treatment/Interventions ADLs/Self Care Home Management;Other (comment);Moist Heat;Balance training;Therapeutic exercise;Therapeutic activities;Functional mobility training;Stair training;Gait training;DME Instruction;Ultrasound;Neuromuscular re-education;Patient/family education;Orthotic Fit/Training;Manual techniques;Taping;Passive range of motion;Electrical Stimulation    PT Next Visit Plan Continue with BLE strength, balance    PT Home Exercise Plan hip abd/add isometric, QS, hamstring stretch;  04/15/21: Isometric abd, abd supine, SAQ, bridge    Consulted and Agree with Plan of Care Patient             Patient will benefit from skilled therapeutic intervention in order to improve the following deficits and impairments:  Abnormal gait, Decreased endurance, Decreased skin integrity, Increased edema, Decreased activity tolerance, Decreased balance, Decreased mobility, Decreased knowledge of use of DME, Decreased strength, Difficulty walking, Pain, Decreased range of motion, Impaired flexibility, Postural dysfunction, Improper body mechanics  Visit Diagnosis: Muscle weakness (generalized)  Difficulty in walking, not elsewhere classified  Unsteadiness on feet     Problem List Patient  Active Problem List   Diagnosis Date Noted   Fatigue 04/29/2021   Recent unexplained weight loss 04/23/2021   Left shoulder pain 04/23/2021   Chest tightness 04/23/2021   Deep tissue injury 04/23/2021    Pneumonia 04/03/2021   PNA (pneumonia) 04/02/2021   Abnormal finding on urinalysis 04/01/2021   Syncope 02/16/2021   Minor head injury    Right hip pain 02/11/2021   Erectile dysfunction 01/22/2021   Gait abnormality 09/30/2020   Mild non proliferative diabetic retinopathy ([place]) 07/16/2020   Need for immunization against influenza 06/03/2020   S/P left knee arthroscopy 05/06/20 05/13/2020   Abnormal MRI 03/26/2020   Joint disorder of knee 03/26/2020   Fall at home, initial encounter 03/13/2020   Anemia 09/03/2019   Reflux esophagitis 09/03/2019   Esophageal dysphagia 05/30/2019   Constipation 05/30/2019   Positive colorectal cancer screening using Cologuard test 05/30/2019   Diabetic foot ulcer ([place]) 09/06/2018   Orthostasis 08/25/2016   Congestive heart failure ([place]) 05/17/2016   Bilateral carotid artery disease ([place]) 07/08/2014   Obstructive sleep apnea 02/12/2014   Restless legs 10/23/2013   Overweight with body mass index (BMI) of 29 to 29.9 in adult 10/23/2013   Hyperlipidemia 07/16/2013   Peripheral arterial disease ([place]) 07/16/2013   Hypertension 03/14/2012   Type 2 diabetes mellitus with circulatory disorder ([place]) 03/14/2012   Peripheral neuropathy 03/14/2012   Cardiomyopathy- EF NL 01/2013, now 30-35% echo 03/14/2012    3:29 PM, 05/04/21 [name], DPT Physical Therapy with [facility]  [phone] office   [place], [place], [address] Phone: [phone]   Fax:  [phone]  Name: ARNES GRANUM MRN: [id number] Date of Birth: [birth year]

## 2021-05-04 NOTE — Telephone Encounter (Signed)
Left message

## 2021-05-05 ENCOUNTER — Other Ambulatory Visit: Payer: Self-pay

## 2021-05-05 ENCOUNTER — Encounter (HOSPITAL_COMMUNITY): Payer: HMO

## 2021-05-05 DIAGNOSIS — E08621 Diabetes mellitus due to underlying condition with foot ulcer: Secondary | ICD-10-CM

## 2021-05-05 NOTE — Telephone Encounter (Signed)
With PNA, sometimes it takes a while to recover. Dr. Melvyn Novas is going to follow-up with him soon to see if he is having recurrent aspiration. He sees cardiology and pulmonology, and if they are happy... we will stay the course. He should have slow improvements and able to increase activity a little each day. If he is feeling worse, he should return to the office for evaluation.

## 2021-05-05 NOTE — Telephone Encounter (Signed)
Pt informed

## 2021-05-05 NOTE — Therapy (Signed)
[facility] [address] [place], [place], [address] Phone: [phone]   Fax:  [phone]  Occupational Therapy Treatment  Patient Details  Name: [name] MRN: [id number] Date of Birth: 03/11/1946 Referring Provider (OT): Demetrius Revel, NP   Encounter Date: 05/04/2021   OT End of Session - 05/04/21 1622     Visit Number 2    Number of Visits 8    Date for OT Re-Evaluation 05/30/21    Authorization Type [employer]; $30 copay    Authorization Time Period no visit limit    Progress Note Due on Visit 10    OT Start Time 1600    OT Stop Time 1638    OT Time Calculation (min) 38 min    Activity Tolerance Patient tolerated treatment well    Behavior During Therapy [facility] for tasks assessed/performed             Past Medical History:  Diagnosis Date   Aftercare following surgery of the circulatory system, NEC 12/04/2013   Angina decubitus ([place]) 05/19/2016   Arrhythmia 05/20/2016   Arthritis    ARTHRITIS, RIGHT FOOT 06/26/2008   Qualifier: Diagnosis of  By: Aline Brochure MD, [name]     Cardiomyopathy- EF NL 01/2013, now 30-35% echo 03/14/2012   Carotid artery occlusion    left s/p CEA   Cellulitis of left foot 05/07/2018   CHF (congestive heart failure) ([place])    Critical lower limb ischemia ([place]) 02/28/2014   Critical limb ischemia    Diabetes mellitus    Elevated troponin 04/11/2016   GERD (gastroesophageal reflux disease)    History of stroke June 2013 03/14/2012   HOH (hard of hearing)    Hypertension    Hypothyroidism    Nonhealing skin ulcer ([place]) 10/28/2014   Obstructive sleep apnea    Orthostatic hypotension    Osteomyelitis ([place])    left great toe   Pain in the chest 05/17/2016   Peripheral arterial disease ([place]), s/p PTA x 2 RLE    nonhealing ulcers bilaterally on each great toe   Pneumonia    PONV (postoperative nausea and vomiting)    Pulmonary nodule 05/17/2016   Restless leg syndrome    Shortness of breath     Stroke [facility]) March 08, 2012   Toe osteomyelitis, left [facility])    Ulcer of great toe, left, with necrosis of bone ([place])    Wears dentures     Past Surgical History:  Procedure Laterality Date   AMPUTATION TOE Left 07/25/2018   Procedure: AMPUTATION TOE INTERPHALANGEAL HALLUX LEFT;  Surgeon: Evelina Bucy, DPM;  Location: [facility];  Service: Podiatry;  Laterality: Left;   ANGIOPLASTY  02/28/14   diamond back orbital rotational atherectomy of Rt. tibial   BACK SURGERY     BONE BIOPSY Left 07/25/2018   Procedure: SUPERFICIAL BONE BIOPSY;  Surgeon: Evelina Bucy, DPM;  Location: [place];  Service: Podiatry;  Laterality: Left;   CARDIAC CATHETERIZATION N/A 05/19/2016   Procedure: Right/Left Heart Cath and Coronary Angiography;  Surgeon: Belva Crome, MD;  Location: [place] CV LAB;  Service: Cardiovascular;  Laterality: N/A;   CERVICAL FUSION     ENDARTERECTOMY Left 11/29/2013   Procedure: ENDARTERECTOMY CAROTID;  Surgeon: Serafina Mitchell, MD;  Location: [facility];  Service: Vascular;  Laterality: Left;   ESOPHAGOGASTRODUODENOSCOPY  02/2010   Dr. Gala Romney: patient presented with food impaction, schatzki ring with superimposed component of stricture with erosive reflux esophagitis, s/p disimpaction  but dilation planned at later date    ESOPHAGOGASTRODUODENOSCOPY (EGD) WITH PROPOFOL N/A 08/13/2019   Dr. Gala Romney: Erosive reflux esophagitis with mild stricture and incidental Mallory-Weiss tear which precluded esophageal dilation.  Medium sized hiatal hernia.   ESOPHAGOGASTRODUODENOSCOPY (EGD) WITH PROPOFOL N/A 10/11/2019   Dr. Gala Romney: Esophageal stenosis status post dilation, moderate hiatal hernia   EYE SURGERY     FLEXIBLE SIGMOIDOSCOPY N/A 08/13/2019   Procedure: FLEXIBLE SIGMOIDOSCOPY;  Surgeon: [name], MD;  [facility];  Service: Endoscopy;  Laterality: N/A;  colonoscopy aboerted due to formed stool and poor prep   FOOT SURGERY     KNEE ARTHROSCOPY WITH MEDIAL MENISECTOMY Left  05/06/2020   Procedure: KNEE ARTHROSCOPY WITH MEDIAL MENISCECTOMY AND LATERAL MENISCECTOMY;  Surgeon: [name], MD;  Location: [facility];  Service: Orthopedics;  Laterality: Left;   Lower ext duplex doppler  03/14/14   Rt ABI 1.2   LOWER EXTREMITY ANGIOGRAM Bilateral 02/18/2014   Procedure: LOWER EXTREMITY ANGIOGRAM;  Surgeon: [name], MD;  Location: [facility] CATH LAB;  Service: Cardiovascular;  Laterality: Bilateral;   LOWER EXTREMITY ANGIOGRAM N/A 10/31/2014   Procedure: LOWER EXTREMITY ANGIOGRAM;  Surgeon: [name], MD;  Location: [facility] CATH LAB;  Service: Cardiovascular;  Laterality: N/A;   MALONEY DILATION N/A 10/11/2019   Procedure: Venia Minks DILATION;  Surgeon: [name], MD;  [facility];  Service: Endoscopy;  Laterality: N/A;   MULTIPLE TOOTH EXTRACTIONS     PV angiogram  02/18/2014   tibial vessel diseas bil.   SPINE SURGERY     tendon achillies lengthing and sesamoid      There were no vitals filed for this visit.   Subjective Assessment - 05/04/21 1622     Subjective  S: It feels weak.    Currently in Pain? No/denies                [facility] OT Assessment - 05/04/21 1620       Assessment   Medical Diagnosis Chronic left shoulder pain      Precautions   Precautions Fall                      OT Treatments/Exercises (OP) - 05/04/21 1619       Exercises   Exercises Shoulder      Shoulder Exercises: Supine   Protraction PROM;AROM;10 reps    Horizontal ABduction PROM;AROM;10 reps    External Rotation PROM;AROM;10 reps    Internal Rotation PROM;AROM;10 reps    Flexion PROM;AROM;10 reps    ABduction PROM;AROM;10 reps      Shoulder Exercises: Seated   Row AROM;10 reps    Protraction AROM;10 reps    Horizontal ABduction AROM;10 reps    External Rotation AROM;10 reps    Internal Rotation AROM;10 reps    Flexion AROM;10 reps;Limitations    Flexion Limitations to 90 degrees    Abduction AROM;10 reps;Limitations     ABduction Limitations to 90 degrees      Manual Therapy   Manual Therapy Myofascial release    Manual therapy comments Manual therapy completed prior to exercises    Myofascial Release Myofascial release and manual stretching completed to left upper arm, upper trapezius, and scapularis region to decrease fascial restrictions and increase joint mobility in a pain free zone.                    OT Education - 05/04/21 1621  Education Details reviewed therapy goals. A/ROM shoulder exercises.    Person(s) Educated Patient    Methods Explanation;Verbal cues;Handout;Demonstration    Comprehension Verbalized understanding;Returned demonstration              OT Short Term Goals - 05/04/21 1609       OT SHORT TERM GOAL #1   Title Pt will be provided with and educated on HEP to improve mobility of LUE required during ADL completion.    Time 4    Period Weeks    Status On-going    Target Date 05/30/21      OT SHORT TERM GOAL #2   Title Pt will decrease pain in LUE to 3/10 or less to improve ability to sleep for 3+ hours without waking due to pain.    Time 4    Period Weeks    Status On-going      OT SHORT TERM GOAL #3   Title Pt will decrease LUE fascial restrictions to min amounts or less to improve ability to complete functional reaching tasks.    Time 4    Period Weeks    Status On-going      OT SHORT TERM GOAL #4   Title Pt will increase LUE A/ROM to [facility] to improve ability to reach for clothes or items in closet.    Time 4    Period Weeks    Status On-going      OT SHORT TERM GOAL #5   Title Pt will increase LUE strength to 4/5 or greater to improve ability to lift cast iron skillet during meal prep tasks.    Time 4    Period Weeks    Status On-going                      Plan - 05/05/21 1017     Clinical Impression Statement A: initiated myofascial release and manual stretching to address fascial restrictions assessed at evaluation in the  left shoulder. Trace fascial restrictions noted this session and progressed into passive ROM. Completed A/ROM supine and seated with VC for form and technique. Provided A/ROM to HEP. Patient verbalized some muscle fatigue at end of session although tolerated all exercises well with rest breaks as needed.    Body Structure / Function / Physical Skills ADL;Endurance;UE functional use;Fascial restriction;Pain;ROM;IADL;Strength    Plan P: Follow up on HEP. Myofascial release PRN. Add mid level or higher functional reaching task. wall wash.    Consulted and Agree with Plan of Care Patient             Patient will benefit from skilled therapeutic intervention in order to improve the following deficits and impairments:   Body Structure / Function / Physical Skills: ADL, Endurance, UE functional use, Fascial restriction, Pain, ROM, IADL, Strength       Visit Diagnosis: Stiffness of left shoulder, not elsewhere classified  Chronic left shoulder pain  Other symptoms and signs involving the musculoskeletal system    Problem List Patient Active Problem List   Diagnosis Date Noted   Fatigue 04/29/2021   Recent unexplained weight loss 04/23/2021   Left shoulder pain 04/23/2021   Chest tightness 04/23/2021   Deep tissue injury 04/23/2021   Pneumonia 04/03/2021   PNA (pneumonia) 04/02/2021   Abnormal finding on urinalysis 04/01/2021   Syncope 02/16/2021   Minor head injury    Right hip pain 02/11/2021   Erectile dysfunction 01/22/2021   Gait abnormality 09/30/2020   Mild  non proliferative diabetic retinopathy ([place]) 07/16/2020   Need for immunization against influenza 06/03/2020   S/P left knee arthroscopy 05/06/20 05/13/2020   Abnormal MRI 03/26/2020   Joint disorder of knee 03/26/2020   Fall at home, initial encounter 03/13/2020   Anemia 09/03/2019   Reflux esophagitis 09/03/2019   Esophageal dysphagia 05/30/2019   Constipation 05/30/2019   Positive colorectal cancer screening  using Cologuard test 05/30/2019   Diabetic foot ulcer ([place]) 09/06/2018   Orthostasis 08/25/2016   Congestive heart failure ([place]) 05/17/2016   Bilateral carotid artery disease ([place]) 07/08/2014   Obstructive sleep apnea 02/12/2014   Restless legs 10/23/2013   Overweight with body mass index (BMI) of 29 to 29.9 in adult 10/23/2013   Hyperlipidemia 07/16/2013   Peripheral arterial disease ([place]) 07/16/2013   Hypertension 03/14/2012   Type 2 diabetes mellitus with circulatory disorder ([place]) 03/14/2012   Peripheral neuropathy 03/14/2012   Cardiomyopathy- EF NL 01/2013, now 30-35% echo 03/14/2012   [name], OTR/L,CBIS  [phone]  05/05/2021, 10:22 AM  [facility] [place], [place], [address] Phone: [phone]   Fax:  [phone]  Name: [name] MRN: [id number] Date of Birth: 21-Mar-1946

## 2021-05-05 NOTE — Telephone Encounter (Signed)
Referral sent to the correct location. Pt wanted to let you know he still doesn't feel 100% better and is still very fatigued. Wanted to know your thoughts. Please advise.

## 2021-05-06 ENCOUNTER — Emergency Department (HOSPITAL_COMMUNITY): Payer: HMO

## 2021-05-06 ENCOUNTER — Encounter (HOSPITAL_COMMUNITY): Payer: Self-pay | Admitting: Emergency Medicine

## 2021-05-06 ENCOUNTER — Emergency Department (HOSPITAL_COMMUNITY)
Admission: EM | Admit: 2021-05-06 | Discharge: 2021-05-07 | Disposition: A | Discharge status: Home / Self Care | Payer: HMO | Attending: Emergency Medicine | Admitting: Emergency Medicine

## 2021-05-06 ENCOUNTER — Telehealth: Payer: Self-pay | Admitting: Internal Medicine

## 2021-05-06 ENCOUNTER — Telehealth (HOSPITAL_COMMUNITY): Payer: Self-pay | Admitting: Occupational Therapy

## 2021-05-06 ENCOUNTER — Other Ambulatory Visit: Payer: Self-pay

## 2021-05-06 DIAGNOSIS — J90 Pleural effusion, not elsewhere classified: Secondary | ICD-10-CM | POA: Diagnosis not present

## 2021-05-06 DIAGNOSIS — E1151 Type 2 diabetes mellitus with diabetic peripheral angiopathy without gangrene: Secondary | ICD-10-CM | POA: Insufficient documentation

## 2021-05-06 DIAGNOSIS — R531 Weakness: Secondary | ICD-10-CM | POA: Diagnosis not present

## 2021-05-06 DIAGNOSIS — E1142 Type 2 diabetes mellitus with diabetic polyneuropathy: Secondary | ICD-10-CM | POA: Diagnosis not present

## 2021-05-06 DIAGNOSIS — Z7982 Long term (current) use of aspirin: Secondary | ICD-10-CM | POA: Diagnosis not present

## 2021-05-06 DIAGNOSIS — Z79899 Other long term (current) drug therapy: Secondary | ICD-10-CM | POA: Insufficient documentation

## 2021-05-06 DIAGNOSIS — I10 Essential (primary) hypertension: Secondary | ICD-10-CM | POA: Diagnosis not present

## 2021-05-06 DIAGNOSIS — R911 Solitary pulmonary nodule: Secondary | ICD-10-CM | POA: Diagnosis not present

## 2021-05-06 DIAGNOSIS — I110 Hypertensive heart disease with heart failure: Secondary | ICD-10-CM | POA: Diagnosis not present

## 2021-05-06 DIAGNOSIS — E113219 Type 2 diabetes mellitus with mild nonproliferative diabetic retinopathy with macular edema, unspecified eye: Secondary | ICD-10-CM | POA: Insufficient documentation

## 2021-05-06 DIAGNOSIS — Z7984 Long term (current) use of oral hypoglycemic drugs: Secondary | ICD-10-CM | POA: Insufficient documentation

## 2021-05-06 DIAGNOSIS — J9811 Atelectasis: Secondary | ICD-10-CM | POA: Diagnosis not present

## 2021-05-06 DIAGNOSIS — R059 Cough, unspecified: Secondary | ICD-10-CM | POA: Insufficient documentation

## 2021-05-06 DIAGNOSIS — E039 Hypothyroidism, unspecified: Secondary | ICD-10-CM | POA: Insufficient documentation

## 2021-05-06 DIAGNOSIS — I509 Heart failure, unspecified: Secondary | ICD-10-CM | POA: Diagnosis not present

## 2021-05-06 DIAGNOSIS — R0602 Shortness of breath: Secondary | ICD-10-CM | POA: Diagnosis not present

## 2021-05-06 DIAGNOSIS — Z87891 Personal history of nicotine dependence: Secondary | ICD-10-CM | POA: Insufficient documentation

## 2021-05-06 DIAGNOSIS — G319 Degenerative disease of nervous system, unspecified: Secondary | ICD-10-CM | POA: Diagnosis not present

## 2021-05-06 DIAGNOSIS — Z7902 Long term (current) use of antithrombotics/antiplatelets: Secondary | ICD-10-CM | POA: Diagnosis not present

## 2021-05-06 DIAGNOSIS — J189 Pneumonia, unspecified organism: Secondary | ICD-10-CM | POA: Diagnosis not present

## 2021-05-06 DIAGNOSIS — Z8701 Personal history of pneumonia (recurrent): Secondary | ICD-10-CM | POA: Diagnosis not present

## 2021-05-06 DIAGNOSIS — I6381 Other cerebral infarction due to occlusion or stenosis of small artery: Secondary | ICD-10-CM | POA: Diagnosis not present

## 2021-05-06 LAB — CBC WITH DIFFERENTIAL/PLATELET
Abs Immature Granulocytes: 0.01 10*3/uL (ref 0.00–0.07)
Basophils Absolute: 0 10*3/uL (ref 0.0–0.1)
Basophils Relative: 1 %
Eosinophils Absolute: 0.1 10*3/uL (ref 0.0–0.5)
Eosinophils Relative: 2 %
HCT: 33.7 % — ABNORMAL LOW (ref 39.0–52.0)
Hemoglobin: 11.3 g/dL — ABNORMAL LOW (ref 13.0–17.0)
Immature Granulocytes: 0 %
Lymphocytes Relative: 31 %
Lymphs Abs: 1.2 10*3/uL (ref 0.7–4.0)
MCH: 34 pg (ref 26.0–34.0)
MCHC: 33.5 g/dL (ref 30.0–36.0)
MCV: 101.5 fL — ABNORMAL HIGH (ref 80.0–100.0)
Monocytes Absolute: 0.3 10*3/uL (ref 0.1–1.0)
Monocytes Relative: 9 %
Neutro Abs: 2.2 10*3/uL (ref 1.7–7.7)
Neutrophils Relative %: 57 %
Platelets: 247 10*3/uL (ref 150–400)
RBC: 3.32 MIL/uL — ABNORMAL LOW (ref 4.22–5.81)
RDW: 15 % (ref 11.5–15.5)
WBC: 3.9 10*3/uL — ABNORMAL LOW (ref 4.0–10.5)
nRBC: 0 % (ref 0.0–0.2)

## 2021-05-06 LAB — COMPREHENSIVE METABOLIC PANEL
ALT: 11 U/L (ref 0–44)
AST: 13 U/L — ABNORMAL LOW (ref 15–41)
Albumin: 4.1 g/dL (ref 3.5–5.0)
Alkaline Phosphatase: 62 U/L (ref 38–126)
Anion gap: 7 (ref 5–15)
BUN: 19 mg/dL (ref 8–23)
CO2: 30 mmol/L (ref 22–32)
Calcium: 8.7 mg/dL — ABNORMAL LOW (ref 8.9–10.3)
Chloride: 100 mmol/L (ref 98–111)
Creatinine, Ser: 0.77 mg/dL (ref 0.61–1.24)
GFR, Estimated: >60 mL/min (ref >60)
Glucose, Bld: 91 mg/dL (ref 70–99)
Potassium: 3.3 mmol/L — ABNORMAL LOW (ref 3.5–5.1)
Sodium: 137 mmol/L (ref 135–145)
Total Bilirubin: 0.4 mg/dL (ref 0.3–1.2)
Total Protein: 7.9 g/dL (ref 6.5–8.1)

## 2021-05-06 MED ORDER — IOHEXOL 350 MG/ML SOLN
100.0000 mL | Freq: Once | INTRAVENOUS | Status: AC | PRN
  Administered 2021-05-07: 100 mL via INTRAVENOUS

## 2021-05-06 NOTE — Telephone Encounter (Signed)
Pt is sick and not getting better - he will be going back to the MD today or tomrrow.

## 2021-05-06 NOTE — ED Provider Notes (Signed)
Emergency Medicine Provider Triage Evaluation Note  KENE WOOLDRIDGE , a 75 y.o. male  was evaluated in triage.  Pt complains of pneumonia.  Pt reports his MD advised him to come in due to weakness  Review of Systems  Positive:  Negative:   Physical Exam  BP (!) 142/77   Pulse 75   Temp (!) 97.4 F (36.3 C) (Oral)   Resp 18   Ht 6' (1.829 m)   Wt 73 kg   SpO2 99%   BMI 21.84 kg/m  Gen:   Awake, no distress   Resp:  Normal effort  MSK:   Moves extremities without difficulty  Other:    Medical Decision Making  Medically screening exam initiated at 6:35 PM.  Appropriate orders placed.  JOSEJUAN STAGNARO was informed that the remainder of the evaluation will be completed by another provider, this initial triage assessment does not replace that evaluation, and the importance of remaining in the ED until their evaluation is complete.     Fransico Meadow, [place] 05/06/21 1836    Fredia Sorrow, MD 05/08/21 [phone]

## 2021-05-06 NOTE — ED Provider Notes (Signed)
[facility] EMERGENCY DEPARTMENT Provider Note   CSN: [id number] Arrival date & time: 05/06/21  1710     History Chief Complaint  Patient presents with   Pneumonia    [name] is a 75 y.o. male.  States 24-year-old male here with persistent generalized weakness.  He thinks is related to the pneumonia he was diagnosed with a month and a half ago.  On review the records it does appear that he is following up with pulmonology who requested he come the ER today to be evaluated secondary to the progressive weakness.  Patient did have a sputum culture done about a week ago that showed Klebsiella that was resistant to ampicillin but susceptible to everything else.  Still some cough but not productive.  No fevers at home.   Pneumonia      Past Medical History:  Diagnosis Date   Aftercare following surgery of the circulatory system, NEC 12/04/2013   Angina decubitus ([place]) 05/19/2016   Arrhythmia 05/20/2016   Arthritis    ARTHRITIS, RIGHT FOOT 06/26/2008   Qualifier: Diagnosis of  By: Aline Brochure MD, [name]     Cardiomyopathy- EF NL 01/2013, now 30-35% echo 03/14/2012   Carotid artery occlusion    left s/p CEA   Cellulitis of left foot 05/07/2018   CHF (congestive heart failure) (HCC)    Critical lower limb ischemia ([place]) 02/28/2014   Critical limb ischemia    Diabetes mellitus    Elevated troponin 04/11/2016   GERD (gastroesophageal reflux disease)    History of stroke June 2013 03/14/2012   HOH (hard of hearing)    Hypertension    Hypothyroidism    Nonhealing skin ulcer ([place]) 10/28/2014   Obstructive sleep apnea    Orthostatic hypotension    Osteomyelitis (HCC)    left great toe   Pain in the chest 05/17/2016   Peripheral arterial disease ([place]), s/p PTA x 2 RLE    nonhealing ulcers bilaterally on each great toe   Pneumonia    PONV (postoperative nausea and vomiting)    Pulmonary nodule 05/17/2016   Restless leg syndrome    Shortness of breath    Stroke [facility]) March 08, 2012    Toe osteomyelitis, left [facility])    Ulcer of great toe, left, with necrosis of bone ([place])    Wears dentures     Patient Active Problem List   Diagnosis Date Noted   Fatigue 04/29/2021   Recent unexplained weight loss 04/23/2021   Left shoulder pain 04/23/2021   Chest tightness 04/23/2021   Deep tissue injury 04/23/2021   Pneumonia 04/03/2021   PNA (pneumonia) 04/02/2021   Abnormal finding on urinalysis 04/01/2021   Syncope 02/16/2021   Minor head injury    Right hip pain 02/11/2021   Erectile dysfunction 01/22/2021   Gait abnormality 09/30/2020   Mild non proliferative diabetic retinopathy ([place]) 07/16/2020   Need for immunization against influenza 06/03/2020   S/P left knee arthroscopy 05/06/20 05/13/2020   Abnormal MRI 03/26/2020   Joint disorder of knee 03/26/2020   Fall at home, initial encounter 03/13/2020   Anemia 09/03/2019   Reflux esophagitis 09/03/2019   Esophageal dysphagia 05/30/2019   Constipation 05/30/2019   Positive colorectal cancer screening using Cologuard test 05/30/2019   Diabetic foot ulcer ([place]) 09/06/2018   Orthostasis 08/25/2016   Congestive heart failure ([place]) 05/17/2016   Bilateral carotid artery disease ([place]) 07/08/2014   Obstructive sleep apnea 02/12/2014   Restless legs 10/23/2013   Overweight with body mass index (  BMI) of 29 to 29.9 in adult 10/23/2013   Hyperlipidemia 07/16/2013   Peripheral arterial disease ([place]) 07/16/2013   Hypertension 03/14/2012   Type 2 diabetes mellitus with circulatory disorder ([place]) 03/14/2012   Peripheral neuropathy 03/14/2012   Cardiomyopathy- EF NL 01/2013, now 30-35% echo 03/14/2012    Past Surgical History:  Procedure Laterality Date   AMPUTATION TOE Left 07/25/2018   Procedure: AMPUTATION TOE INTERPHALANGEAL HALLUX LEFT;  Surgeon: Evelina Bucy, DPM;  Location: [place];  Service: Podiatry;  Laterality: Left;   ANGIOPLASTY  02/28/14   diamond back orbital rotational atherectomy of Rt. tibial   BACK SURGERY      BONE BIOPSY Left 07/25/2018   Procedure: SUPERFICIAL BONE BIOPSY;  Surgeon: Evelina Bucy, DPM;  Location: [place];  Service: Podiatry;  Laterality: Left;   CARDIAC CATHETERIZATION N/A 05/19/2016   Procedure: Right/Left Heart Cath and Coronary Angiography;  Surgeon: Belva Crome, MD;  Location: [place] CV LAB;  Service: Cardiovascular;  Laterality: N/A;   CERVICAL FUSION     ENDARTERECTOMY Left 11/29/2013   Procedure: ENDARTERECTOMY CAROTID;  Surgeon: Serafina Mitchell, MD;  Location: [place];  Service: Vascular;  Laterality: Left;   ESOPHAGOGASTRODUODENOSCOPY  02/2010   Dr. Gala Romney: patient presented with food impaction, schatzki ring with superimposed component of stricture with erosive reflux esophagitis, s/p disimpaction but dilation planned at later date    ESOPHAGOGASTRODUODENOSCOPY (EGD) WITH PROPOFOL N/A 08/13/2019   Dr. Gala Romney: Erosive reflux esophagitis with mild stricture and incidental Mallory-Weiss tear which precluded esophageal dilation.  Medium sized hiatal hernia.   ESOPHAGOGASTRODUODENOSCOPY (EGD) WITH PROPOFOL N/A 10/11/2019   Dr. Gala Romney: Esophageal stenosis status post dilation, moderate hiatal hernia   EYE SURGERY     FLEXIBLE SIGMOIDOSCOPY N/A 08/13/2019   Procedure: FLEXIBLE SIGMOIDOSCOPY;  Surgeon: [name], MD;  [facility];  Service: Endoscopy;  Laterality: N/A;  colonoscopy aboerted due to formed stool and poor prep   FOOT SURGERY     KNEE ARTHROSCOPY WITH MEDIAL MENISECTOMY Left 05/06/2020   Procedure: KNEE ARTHROSCOPY WITH MEDIAL MENISCECTOMY AND LATERAL MENISCECTOMY;  Surgeon: [name], MD;  Location: [facility];  Service: Orthopedics;  Laterality: Left;   Lower ext duplex doppler  03/14/14   Rt ABI 1.2   LOWER EXTREMITY ANGIOGRAM Bilateral 02/18/2014   Procedure: LOWER EXTREMITY ANGIOGRAM;  Surgeon: [name], MD;  Location: [facility] CATH LAB;  Service: Cardiovascular;  Laterality: Bilateral;   LOWER EXTREMITY ANGIOGRAM N/A 10/31/2014    Procedure: LOWER EXTREMITY ANGIOGRAM;  Surgeon: [name], MD;  Location: [facility] CATH LAB;  Service: Cardiovascular;  Laterality: N/A;   MALONEY DILATION N/A 10/11/2019   Procedure: Venia Minks DILATION;  Surgeon: [name], MD;  [facility];  Service: Endoscopy;  Laterality: N/A;   MULTIPLE TOOTH EXTRACTIONS     PV angiogram  02/18/2014   tibial vessel diseas bil.   SPINE SURGERY     tendon achillies lengthing and sesamoid         Family History  Problem Relation Age of Onset   Heart disease Mother    Hypertension Mother    Heart attack Mother    Hypertension Father    Diabetes Father    Diabetes Son    Heart disease Son    Hypertension Son    Colon cancer Neg Hx     Social History   Tobacco Use   Smoking status: Former    Types: Pipe    Quit date: 07/08/1977  Years since quitting: 43.8   Smokeless tobacco: Never  Vaping Use   Vaping Use: Never used  Substance Use Topics   Alcohol use: No    Alcohol/week: 0.0 standard drinks   Drug use: No    Home Medications Prior to Admission medications   Medication Sig Start Date End Date Taking? Authorizing Provider  levofloxacin (LEVAQUIN) 500 MG tablet Take 1 tablet (500 mg total) by mouth daily. 05/07/21  Yes Rendi Mapel, Corene Cornea, MD  amoxicillin-clavulanate (AUGMENTIN) 875-125 MG tablet Take 1 tablet by mouth 2 (two) times daily for 14 days. 05/01/21 05/15/21  Tanda Rockers, MD  aspirin EC 81 MG EC tablet Take 1 tablet (81 mg total) by mouth daily. 05/21/16   Barrett, Evelene Croon, PA-C  b complex vitamins tablet Take 1 tablet by mouth daily.    [provider]  carvedilol (COREG) 3.125 MG tablet Take 0.5 tablets (1.5625 mg total) by mouth 2 (two) times daily with a meal. 03/18/21   Noreene Larsson, NP  Cholecalciferol (VITAMIN D3) 5000 units TABS Take 5,000 Units by mouth 2 (two) times daily.     [provider]  clopidogrel (PLAVIX) 75 MG tablet TAKE ONE (1) TABLET BY MOUTH EVERY DAY 03/16/21   Fayrene Helper, MD  Continuous Blood Gluc Receiver (FREESTYLE LIBRE 14 DAY READER) DEVI  12/07/19   [provider]  Continuous Blood Gluc Sensor (FREESTYLE LIBRE 14 DAY SENSOR) MISC Inject 1 each into the skin every 14 (fourteen) days. 01/22/21   Noreene Larsson, NP  Cyanocobalamin 1500 MCG TBDP Take 1,500 mcg by mouth daily.     [provider]  DULoxetine (CYMBALTA) 60 MG capsule Take 1 capsule (60 mg total) by mouth daily. 09/09/20   Fayrene Helper, MD  empagliflozin (JARDIANCE) 25 MG TABS tablet Take 1 tablet (25 mg total) by mouth daily before breakfast. 09/09/20   Fayrene Helper, MD  furosemide (LASIX) 40 MG tablet Take 1 tablet (40 mg total) by mouth daily. 02/19/21   Johnson, Clanford L, MD  levothyroxine (SYNTHROID) 88 MCG tablet Take 1 tablet (88 mcg total) by mouth daily. 01/27/21   Noreene Larsson, NP  linagliptin (TRADJENTA) 5 MG TABS tablet Take 1 tablet (5 mg total) by mouth daily. 02/17/21   Murlean Iba, MD  Multiple Vitamin (MULTIVITAMIN WITH MINERALS) TABS tablet Take 1 tablet by mouth daily.    [provider]  nitroGLYCERIN (NITROSTAT) 0.4 MG SL tablet Place 1 tablet (0.4 mg total) under the tongue every 5 (five) minutes as needed for chest pain. 05/22/16   Barrett, Evelene Croon, PA-C  pantoprazole (PROTONIX) 40 MG tablet TAKE ONE (1) TABLET BY MOUTH EVERY DAY 04/10/20   Erenest Rasher, PA-C  potassium chloride (KLOR-CON) 10 MEQ tablet TAKE ONE TABLET (10MEQ TOTAL) BY MOUTH DAILY 03/16/21   [name], MD  rosuvastatin (CRESTOR) 20 MG tablet Take 1 tablet (20 mg total) by mouth daily. 09/09/20   Fayrene Helper, MD  UNABLE TO [place] [facility] supplies for CPAP 09/30/20   Perlie Mayo, NP  UNABLE TO FIND [place] sock aid to help with support hose. Size XL.  Dx:I73.9, G25.81, R60.9 01/14/21   Noreene Larsson, NP    Allergies    Codeine and Tramadol  Review of Systems   Review of Systems  All other systems reviewed and are  negative.  Physical Exam Updated Vital Signs BP 124/63   Pulse 67   Temp (!) 97.4 F (  36.3 C) (Oral)   Resp 18   Ht 6' (1.829 m)   Wt 73 kg   SpO2 97%   BMI 21.84 kg/m   Physical Exam Vitals and nursing note reviewed.  Constitutional:      Appearance: He is well-developed.  HENT:     Head: Normocephalic and atraumatic.     Mouth/Throat:     Mouth: Mucous membranes are moist.     Pharynx: Oropharynx is clear.  Eyes:     Pupils: Pupils are equal, round, and reactive to light.  Cardiovascular:     Rate and Rhythm: Normal rate.  Pulmonary:     Effort: Pulmonary effort is normal. No respiratory distress.  Abdominal:     General: There is no distension.  Musculoskeletal:        General: Normal range of motion.     Cervical back: Normal range of motion.  Skin:    General: Skin is warm and dry.     Coloration: Skin is not jaundiced or pale.     Findings: No bruising.  Neurological:     General: No focal deficit present.     Mental Status: He is alert.    ED Results / Procedures / Treatments   Labs (all labs ordered are listed, but only abnormal results are displayed) Labs Reviewed  CBC WITH DIFFERENTIAL/PLATELET - Abnormal; Notable for the following components:      Result Value   WBC 3.9 (*)    RBC 3.32 (*)    Hemoglobin 11.3 (*)    HCT 33.7 (*)    MCV 101.5 (*)    All other components within normal limits  COMPREHENSIVE METABOLIC PANEL - Abnormal; Notable for the following components:   Potassium 3.3 (*)    Calcium 8.7 (*)    AST 13 (*)    All other components within normal limits  BRAIN NATRIURETIC PEPTIDE - Abnormal; Notable for the following components:   B Natriuretic Peptide 135.0 (*)    All other components within normal limits  TSH - Abnormal; Notable for the following components:   TSH 4.637 (*)    All other components within normal limits  TROPONIN I (HIGH SENSITIVITY)  TROPONIN I (HIGH SENSITIVITY)    EKG None  Radiology DG Chest 2  View  Result Date: 05/06/2021 CLINICAL DATA:  Shortness of breath. EXAM: CHEST - 2 VIEW COMPARISON:  April 23, 2021. FINDINGS: The heart size and mediastinal contours are within normal limits. No acute pulmonary abnormality is noted. The visualized skeletal structures are unremarkable. IMPRESSION: No active cardiopulmonary disease. Aortic Atherosclerosis (ICD10-I70.0). Electronically Signed   By: Marijo Conception M.D.   On: 05/06/2021 19:14   CT HEAD WO CONTRAST (5MM)  Result Date: 05/07/2021 CLINICAL DATA:  Weakness with recent diagnosis of pneumonia. EXAM: CT HEAD WITHOUT CONTRAST TECHNIQUE: Contiguous axial images were obtained from the base of the skull through the vertex without intravenous contrast. COMPARISON:  Feb 15, 2021 FINDINGS: Brain: There is mild cerebral atrophy with widening of the extra-axial spaces and ventricular dilatation. There are areas of decreased attenuation within the white matter tracts of the supratentorial brain, consistent with microvascular disease changes. Small chronic bilateral basal ganglia lacunar infarcts are seen. Vascular: No hyperdense vessel or unexpected calcification. Skull: Normal. Negative for fracture or focal lesion. Sinuses/Orbits: No acute finding. Other: None. IMPRESSION: 1. Generalized cerebral atrophy. 2. No acute intracranial abnormality. Electronically Signed   By: Virgina Norfolk M.D.   On: 05/07/2021 00:48   CT  Angio Chest PE W and/or Wo Contrast  Result Date: 05/07/2021 CLINICAL DATA:  Recent diagnosis of pneumonia 2 weeks ago with persistent weakness. EXAM: CT ANGIOGRAPHY CHEST WITH CONTRAST TECHNIQUE: Multidetector CT imaging of the chest was performed using the standard protocol during bolus administration of intravenous contrast. Multiplanar CT image reconstructions and MIPs were obtained to evaluate the vascular anatomy. CONTRAST:  125m OMNIPAQUE IOHEXOL 350 MG/ML SOLN COMPARISON:  May 17, 2016 FINDINGS: Cardiovascular: There is marked  severity calcification of the aortic arch, without evidence of aneurysmal dilatation. Satisfactory opacification of the pulmonary arteries to the segmental level. No evidence of pulmonary embolism. Normal heart size with mild coronary artery calcification. No pericardial effusion. Mediastinum/Nodes: No enlarged mediastinal, hilar, or axillary lymph nodes. Thyroid gland, trachea, and esophagus demonstrate no significant findings. Lungs/Pleura: Mild hazy infiltrate is seen within the posterior aspect of the right upper lobe. Adjacent 4 mm and 5 mm noncalcified lung nodules are seen within the posteromedial aspect of the left lower lobe (axial CT image 74, CT series 6). This area is limited in evaluation on the prior study due to the presence of a left pleural effusion and adjacent airspace disease. An 8 mm noncalcified lung nodule is noted within the left lung base (axial CT image 101, CT series 6). This is not clearly identified on the prior exam. A 2 mm noncalcified lung nodule versus focal scar is seen along the major fissure on the left (axial CT image 62, CT series 6). There is a 4 mm noncalcified right middle lobe lung nodule (axial CT image 71, CT series 6). 6 mm noncalcified lung nodules are seen along the posteromedial aspect of the right lower lobe (axial CT image 60, CT series number 6) and posterior aspect of the right middle lobe (axial CT image 66, CT series 6). Mild atelectasis is noted within the posterior aspect of the left lower lobe. There is no evidence of a pleural effusion or pneumothorax. Upper Abdomen: No acute abnormality. Musculoskeletal: Multilevel degenerative changes seen throughout the thoracic spine. Review of the MIP images confirms the above findings. IMPRESSION: 1. No evidence of pulmonary embolism. 2. Mild right upper lobe infiltrate. 3. Bilateral subcentimeter noncalcified lung nodules, the largest measuring approximately 8 mm. Non-contrast chest CT at 3-6 months is recommended. If  the nodules are stable at time of repeat CT, then future CT at 18-24 months (from today's scan) is considered optional for low-risk patients, but is recommended for high-risk patients. This recommendation follows the consensus statement: Guidelines for Management of Incidental Pulmonary Nodules Detected on CT Images: From the Fleischner Society 2017; Radiology 2017; 284:228-243. Electronically Signed   By: TVirgina NorfolkM.D.   On: 05/07/2021 00:46    Procedures Procedures   Medications Ordered in ED Medications  iohexol (OMNIPAQUE) 350 MG/ML injection 100 mL (100 mLs Intravenous Contrast Given 05/07/21 0009)  levofloxacin (LEVAQUIN) tablet 500 mg (500 mg Oral Given 05/07/21 0206)    ED Course  I have reviewed the triage vital signs and the nursing notes.  Pertinent labs & imaging results that were available during my care of the patient were reviewed by me and considered in my medical decision making (see chart for details).    MDM Rules/Calculators/A&P                         I suspect patient is just deconditioned.  He does have any evidence of sepsis here CT scan is overall unremarkable.  Questionable opacity  on his lungs however its in a different spot than the last 2 chest x-rays.  Could be just atelectasis.  I discussed with Dr. Earlie Server with pulmonary about his sputum culture and his situation.  He suggested adding on Levaquin and have him follow-up with his outpatient pulmonologist.  I discussed with the patient.  He he is okay with this.  He is already in physical therapy and rehab and he thinks that his strength is getting better with those things.  Will return to the ER for any worsening symptoms   Final Clinical Impression(s) / ED Diagnoses Final diagnoses:  Weakness    Rx / DC Orders ED Discharge Orders          Ordered    levofloxacin (LEVAQUIN) 500 MG tablet  Daily        05/07/21 0158             Lehua Flores, Corene Cornea, MD 05/07/21 0745

## 2021-05-06 NOTE — Telephone Encounter (Signed)
Called and spoke with patient, provided recommendations per Dr. Melvyn Novas.  Patient verbalized understanding.  He asked if there was any way around going to the ER and getting a bed at he hospitals.  Advised that most of the hospitals are full and there are no beds for direct admits.  Advised I would call [facility] and check the status.  He was appreciative.  Advised I would call him back and let him know.  Called [facility] hospital and verified that there are patients holding in the ER for beds.  Called patient and let him know that we are unable to direct admit patients.  He verbalized understanding.  Nothing further needed.

## 2021-05-06 NOTE — Telephone Encounter (Signed)
Needs to go to er, we gave him the strongest outpt rx we could for this problem so there may be more to it that I hoped there would be

## 2021-05-06 NOTE — Progress Notes (Signed)
The sputum culture grew tetracycline-sensitive Klebsiella, so the deoxycycline that I sent in should help get rid of the PNA. If you feel worse, return to the clinic or at least give [place] a call.

## 2021-05-06 NOTE — Telephone Encounter (Signed)
Called and spoke with patient who states he has pneumonia. States he has been getting weaker since visit. States that he did pick up new RX for Augmentin and has been taking that twice a day. No new symptoms    Dr. Melvyn Novas please advise.

## 2021-05-06 NOTE — ED Triage Notes (Signed)
Pt dx with PNA 2 weeks ago. Pt feels he is getting weaker despite antibiotic tx.

## 2021-05-07 ENCOUNTER — Encounter (HOSPITAL_COMMUNITY): Payer: HMO | Admitting: Occupational Therapy

## 2021-05-07 ENCOUNTER — Encounter (HOSPITAL_COMMUNITY): Payer: HMO

## 2021-05-07 DIAGNOSIS — R531 Weakness: Secondary | ICD-10-CM | POA: Diagnosis not present

## 2021-05-07 DIAGNOSIS — J9811 Atelectasis: Secondary | ICD-10-CM | POA: Diagnosis not present

## 2021-05-07 DIAGNOSIS — J90 Pleural effusion, not elsewhere classified: Secondary | ICD-10-CM | POA: Diagnosis not present

## 2021-05-07 DIAGNOSIS — R911 Solitary pulmonary nodule: Secondary | ICD-10-CM | POA: Diagnosis not present

## 2021-05-07 DIAGNOSIS — G319 Degenerative disease of nervous system, unspecified: Secondary | ICD-10-CM | POA: Diagnosis not present

## 2021-05-07 DIAGNOSIS — J189 Pneumonia, unspecified organism: Secondary | ICD-10-CM | POA: Diagnosis not present

## 2021-05-07 DIAGNOSIS — I6381 Other cerebral infarction due to occlusion or stenosis of small artery: Secondary | ICD-10-CM | POA: Diagnosis not present

## 2021-05-07 DIAGNOSIS — Z8701 Personal history of pneumonia (recurrent): Secondary | ICD-10-CM | POA: Diagnosis not present

## 2021-05-07 LAB — TROPONIN I (HIGH SENSITIVITY)
Troponin I (High Sensitivity): 6 ng/L (ref <18)
Troponin I (High Sensitivity): 6 ng/L (ref <18)

## 2021-05-07 LAB — TSH: TSH: 4.637 u[IU]/mL — ABNORMAL HIGH (ref 0.350–4.500)

## 2021-05-07 LAB — BRAIN NATRIURETIC PEPTIDE: B Natriuretic Peptide: 135 pg/mL — ABNORMAL HIGH (ref 0.0–100.0)

## 2021-05-07 MED ORDER — LEVOFLOXACIN 500 MG PO TABS: 500.0000 mg | ORAL_TABLET | Freq: Every day | ORAL | 0 refills | Status: DC

## 2021-05-07 MED ORDER — LEVOFLOXACIN 500 MG PO TABS
500.0000 mg | ORAL_TABLET | Freq: Once | ORAL | Status: AC
  Administered 2021-05-07: 500 mg via ORAL
  Filled 2021-05-07: qty 1

## 2021-05-07 NOTE — Progress Notes (Signed)
From yesterday's note, "He suggested adding on Levaquin and have him follow-up with his outpatient pulmonologist. " So, I would have him finish up the doxycycline as well since they said "adding on" instead of "switch" or "change".

## 2021-05-07 NOTE — ED Notes (Signed)
Patient transported to CT 

## 2021-05-08 ENCOUNTER — Ambulatory Visit: Payer: HMO | Admitting: Podiatry

## 2021-05-10 LAB — SPUTUM CULTURE

## 2021-05-10 LAB — GRAM STAIN W/SPUTUM CULT RFLX

## 2021-05-11 ENCOUNTER — Other Ambulatory Visit: Payer: Self-pay | Admitting: Cardiovascular Disease

## 2021-05-12 ENCOUNTER — Ambulatory Visit (INDEPENDENT_AMBULATORY_CARE_PROVIDER_SITE_OTHER): Payer: HMO | Admitting: Nurse Practitioner

## 2021-05-12 ENCOUNTER — Encounter: Payer: Self-pay | Admitting: Nurse Practitioner

## 2021-05-12 ENCOUNTER — Encounter (HOSPITAL_COMMUNITY): Payer: HMO

## 2021-05-12 ENCOUNTER — Other Ambulatory Visit: Payer: Self-pay

## 2021-05-12 VITALS — BP 122/66 | HR 80 | Ht 72.0 in | Wt 191.0 lb

## 2021-05-12 DIAGNOSIS — R5383 Other fatigue: Secondary | ICD-10-CM | POA: Diagnosis not present

## 2021-05-12 DIAGNOSIS — R29898 Other symptoms and signs involving the musculoskeletal system: Secondary | ICD-10-CM | POA: Diagnosis not present

## 2021-05-12 DIAGNOSIS — R296 Repeated falls: Secondary | ICD-10-CM | POA: Diagnosis not present

## 2021-05-12 DIAGNOSIS — J189 Pneumonia, unspecified organism: Secondary | ICD-10-CM

## 2021-05-12 NOTE — Assessment & Plan Note (Addendum)
-  he finished doxycycline and was started on levaquin by EDP with pulm. Consult -he still is feeling weak, and states he doesn't feel much improved -right lung fields have rhonchi today

## 2021-05-12 NOTE — Progress Notes (Signed)
Acute Office Visit  Subjective:    Patient ID: ASHANTI LITTLES, male    DOB: 1945/12/13, 75 y.o.   MRN: [id number]  Chief Complaint  Patient presents with   Follow-up    Went to the ER for muscle weakness, cough, and sob. Still not feeling any better. Having a lot of difficulty walking. [place] last weekend, almost fell in the parking lot today.    HPI Patient is in today for ED follow-up. He was seen in the ED on 05/06/21. No evidence of sepsis at that time, and CT scan was unremarkable. There was a pulmonary consult, and levaquin was added. He is already attending PT for rehab for deconditioning.  Past Medical History:  Diagnosis Date   Aftercare following surgery of the circulatory system, NEC 12/04/2013   Angina decubitus ([place]) 05/19/2016   Arrhythmia 05/20/2016   Arthritis    ARTHRITIS, RIGHT FOOT 06/26/2008   Qualifier: Diagnosis of  By: Aline Brochure MD, [name]     Cardiomyopathy- EF NL 01/2013, now 30-35% echo 03/14/2012   Carotid artery occlusion    left s/p CEA   Cellulitis of left foot 05/07/2018   CHF (congestive heart failure) (HCC)    Critical lower limb ischemia ([facility]) 02/28/2014   Critical limb ischemia    Diabetes mellitus    Elevated troponin 04/11/2016   GERD (gastroesophageal reflux disease)    History of stroke June 2013 03/14/2012   HOH (hard of hearing)    Hypertension    Hypothyroidism    Nonhealing skin ulcer ([place]) 10/28/2014   Obstructive sleep apnea    Orthostatic hypotension    Osteomyelitis (HCC)    left great toe   Pain in the chest 05/17/2016   Peripheral arterial disease ([place]), s/p PTA x 2 RLE    nonhealing ulcers bilaterally on each great toe   Pneumonia    PONV (postoperative nausea and vomiting)    Pulmonary nodule 05/17/2016   Restless leg syndrome    Shortness of breath    Stroke [facility]) March 08, 2012   Toe osteomyelitis, left [facility])    Ulcer of great toe, left, with necrosis of bone ([place])    Wears dentures     Past Surgical History:  Procedure  Laterality Date   AMPUTATION TOE Left 07/25/2018   Procedure: AMPUTATION TOE INTERPHALANGEAL HALLUX LEFT;  Surgeon: Evelina Bucy, DPM;  Location: [place];  Service: Podiatry;  Laterality: Left;   ANGIOPLASTY  02/28/14   diamond back orbital rotational atherectomy of Rt. tibial   BACK SURGERY     BONE BIOPSY Left 07/25/2018   Procedure: SUPERFICIAL BONE BIOPSY;  Surgeon: Evelina Bucy, DPM;  Location: [place];  Service: Podiatry;  Laterality: Left;   CARDIAC CATHETERIZATION N/A 05/19/2016   Procedure: Right/Left Heart Cath and Coronary Angiography;  Surgeon: Belva Crome, MD;  Location: [place] [facility];  Service: Cardiovascular;  Laterality: N/A;   CERVICAL FUSION     ENDARTERECTOMY Left 11/29/2013   Procedure: ENDARTERECTOMY CAROTID;  Surgeon: Serafina Mitchell, MD;  Location: [place];  Service: Vascular;  Laterality: Left;   ESOPHAGOGASTRODUODENOSCOPY  02/2010   Dr. Gala Romney: patient presented with food impaction, schatzki ring with superimposed component of stricture with erosive reflux esophagitis, s/p disimpaction but dilation planned at later date    ESOPHAGOGASTRODUODENOSCOPY (EGD) WITH PROPOFOL N/A 08/13/2019   Dr. Gala Romney: Erosive reflux esophagitis with mild stricture and incidental Mallory-Weiss tear which precluded esophageal dilation.  Medium sized hiatal hernia.   ESOPHAGOGASTRODUODENOSCOPY (EGD)  WITH PROPOFOL N/A 10/11/2019   Dr. Gala Romney: Esophageal stenosis status post dilation, moderate hiatal hernia   EYE SURGERY     FLEXIBLE SIGMOIDOSCOPY N/A 08/13/2019   Procedure: FLEXIBLE SIGMOIDOSCOPY;  Surgeon: [name], MD;  [facility];  Service: Endoscopy;  Laterality: N/A;  colonoscopy aboerted due to formed stool and poor prep   FOOT SURGERY     KNEE ARTHROSCOPY WITH MEDIAL MENISECTOMY Left 05/06/2020   Procedure: KNEE ARTHROSCOPY WITH MEDIAL MENISCECTOMY AND LATERAL MENISCECTOMY;  Surgeon: [name], MD;  Location: [facility];  Service: Orthopedics;  Laterality:  Left;   Lower ext duplex doppler  03/14/14   Rt ABI 1.2   LOWER EXTREMITY ANGIOGRAM Bilateral 02/18/2014   Procedure: LOWER EXTREMITY ANGIOGRAM;  Surgeon: [name], MD;  Location: [facility] CATH LAB;  Service: Cardiovascular;  Laterality: Bilateral;   LOWER EXTREMITY ANGIOGRAM N/A 10/31/2014   Procedure: LOWER EXTREMITY ANGIOGRAM;  Surgeon: [name], MD;  Location: [facility] CATH LAB;  Service: Cardiovascular;  Laterality: N/A;   MALONEY DILATION N/A 10/11/2019   Procedure: Venia Minks DILATION;  Surgeon: [name], MD;  [facility];  Service: Endoscopy;  Laterality: N/A;   MULTIPLE TOOTH EXTRACTIONS     PV angiogram  02/18/2014   tibial vessel diseas bil.   SPINE SURGERY     tendon achillies lengthing and sesamoid      Family History  Problem Relation Age of Onset   Heart disease Mother    Hypertension Mother    Heart attack Mother    Hypertension Father    Diabetes Father    Diabetes Son    Heart disease Son    Hypertension Son    Colon cancer Neg Hx     Social History   Socioeconomic History   Marital status: Married    Spouse name: [name]    Number of children: 4   Years of education: college   Highest education level: Not on file  Occupational History   Occupation: Firefighter   Tobacco Use   Smoking status: Former    Types: Pipe    Quit date: 07/08/1977    Years since quitting: 43.8   Smokeless tobacco: Never  Vaping Use   Vaping Use: Never used  Substance and Sexual Activity   Alcohol use: No    Alcohol/week: 0.0 standard drinks   Drug use: No   Sexual activity: Not on file  Other Topics Concern   Not on file  Social History Narrative   Retired from [employer] social research officer -[employer]      Lives with [name]    Cat: Jazz      Enjoy: sleeping a lot       Diet: eats all food groups -chicken, steak-chopped, mostly veggie   Caffeine: coffee and soda "a lot"   Water: 3-4 cups daily      Wears seat belt   Does not use phone while driving    Astronomer at home    weapons at home        Social Determinants of Health   Financial Resource Strain: Not on file  Food Insecurity: No Food Insecurity   Worried About Charity fundraiser in the Last Year: Never true   Arboriculturist in the Last Year: Never true  Transportation Needs: No Transportation Needs   Lack of Transportation (Medical): No   Lack of Transportation (Non-Medical): No  Physical Activity: Not on file  Stress: Not on  file  Social Connections: Moderately Isolated   Frequency of Communication with Friends and Family: Three times a week   Frequency of Social Gatherings with Friends and Family: Three times a week   Attends Religious Services: Never   Active Member of Clubs or Organizations: No   Attends Banker Meetings: Never   Marital Status: Married  Catering manager Violence: Not on file    Outpatient Medications Prior to Visit  Medication Sig Dispense Refill   amoxicillin-clavulanate (AUGMENTIN) 875-125 MG tablet Take 1 tablet by mouth 2 (two) times daily for 14 days. 28 tablet 0   aspirin EC 81 MG EC tablet Take 1 tablet (81 mg total) by mouth daily.     b complex vitamins tablet Take 1 tablet by mouth daily.     carvedilol (COREG) 3.125 MG tablet Take 0.5 tablets (1.5625 mg total) by mouth 2 (two) times daily with a meal. 180 tablet 3   Cholecalciferol (VITAMIN D3) 5000 units TABS Take 5,000 Units by mouth 2 (two) times daily.      clopidogrel (PLAVIX) 75 MG tablet TAKE ONE (1) TABLET BY MOUTH EVERY DAY 90 tablet 0   Continuous Blood Gluc Receiver (FREESTYLE LIBRE 14 DAY READER) DEVI      Continuous Blood Gluc Sensor (FREESTYLE LIBRE 14 DAY SENSOR) MISC Inject 1 each into the skin every 14 (fourteen) days. 2 each 5   Cyanocobalamin 1500 MCG TBDP Take 1,500 mcg by mouth daily.      DULoxetine (CYMBALTA) 60 MG capsule Take 1 capsule (60 mg total) by mouth daily. 90 capsule 1   empagliflozin (JARDIANCE) 25 MG TABS tablet Take 1 tablet (25 mg  total) by mouth daily before breakfast. 90 tablet 1   furosemide (LASIX) 40 MG tablet Take 1 tablet (40 mg total) by mouth daily.     levofloxacin (LEVAQUIN) 500 MG tablet Take 1 tablet (500 mg total) by mouth daily. 7 tablet 0   levothyroxine (SYNTHROID) 88 MCG tablet Take 1 tablet (88 mcg total) by mouth daily. 90 tablet 3   linagliptin (TRADJENTA) 5 MG TABS tablet Take 1 tablet (5 mg total) by mouth daily. 30 tablet    Multiple Vitamin (MULTIVITAMIN WITH MINERALS) TABS tablet Take 1 tablet by mouth daily.     nitroGLYCERIN (NITROSTAT) 0.4 MG SL tablet Place 1 tablet (0.4 mg total) under the tongue every 5 (five) minutes as needed for chest pain. 25 tablet 3   pantoprazole (PROTONIX) 40 MG tablet TAKE ONE (1) TABLET BY MOUTH EVERY DAY 30 tablet 11   potassium chloride (KLOR-CON) 10 MEQ tablet TAKE ONE TABLET ( TOTAL) BY MOUTH DAILY 90 tablet 3   rosuvastatin (CRESTOR) 20 MG tablet Take 1 tablet (20 mg total) by mouth daily. 90 tablet 1   UNABLE TO FIND Home health supplies for CPAP 1 Product 0   UNABLE TO FIND [place] sock aid to help with support hose. Size XL.  Dx:I73.9, G25.81, R60.9 1 each 0   No facility-administered medications prior to visit.    Allergies  Allergen Reactions   Codeine Nausea And Vomiting   Tramadol Nausea Only    Review of Systems  Constitutional:  Positive for activity change and fatigue.       Decreased activity  HENT: Negative.    Respiratory: Negative.    Cardiovascular: Negative.   Musculoskeletal:  Positive for arthralgias.       Left hip pain  Psychiatric/Behavioral: Negative.        Objective:  Physical Exam Constitutional:      General: He is not in acute distress.    Appearance: Normal appearance.  Cardiovascular:     Rate and Rhythm: Normal rate and regular rhythm.     Pulses: Normal pulses.     Heart sounds: Normal heart sounds.  Pulmonary:     Effort: Pulmonary effort is normal.     Breath sounds: Rhonchi present.   Musculoskeletal:     Comments: Uses rollator for ambulation  Neurological:     Mental Status: He is alert.    BP 122/66 (BP Location: Right Arm, Patient Position: Sitting, Cuff Size: Large)   Pulse 80   Ht 6' (1.829 m)   Wt 191 lb (86.6 kg)   SpO2 95%   BMI 25.90 kg/m  Wt Readings from Last 3 Encounters:  05/12/21 191 lb (86.6 kg)  05/06/21 161 lb (73 kg)  05/01/21 161 lb (73 kg)    Health Maintenance Due  Topic Date Due   COVID-19 Vaccine (4 - Booster for Moderna series) 12/20/2020   OPHTHALMOLOGY EXAM  02/07/2021   INFLUENZA VACCINE  04/20/2021    There are no preventive care reminders to display for this patient.   Lab Results  Component Value Date   TSH 4.637 (H) 05/06/2021   Lab Results  Component Value Date   WBC 3.9 (L) 05/06/2021   HGB 11.3 (L) 05/06/2021   HCT 33.7 (L) 05/06/2021   MCV 101.5 (H) 05/06/2021   PLT 247 05/06/2021   Lab Results  Component Value Date   NA 137 05/06/2021   K 3.3 (L) 05/06/2021   CO2 30 05/06/2021   GLUCOSE 91 05/06/2021   BUN 19 05/06/2021   CREATININE 0.77 05/06/2021   BILITOT 0.4 05/06/2021   ALKPHOS 62 05/06/2021   AST 13 (L) 05/06/2021   ALT 11 05/06/2021   PROT 7.9 05/06/2021   ALBUMIN 4.1 05/06/2021   CALCIUM 8.7 (L) 05/06/2021   ANIONGAP 7 05/06/2021   EGFR 93 04/29/2021   Lab Results  Component Value Date   CHOL 149 01/26/2021   Lab Results  Component Value Date   HDL 46 01/26/2021   Lab Results  Component Value Date   LDLCALC 88 01/26/2021   Lab Results  Component Value Date   TRIG 78 01/26/2021   Lab Results  Component Value Date   CHOLHDL 2.8 09/25/2020   Lab Results  Component Value Date   HGBA1C 9.6 (H) 01/26/2021       Assessment & Plan:   Problem List Items Addressed This Visit       Respiratory   Pneumonia - Primary    -he finished doxycycline and was started on levaquin by EDP with pulm. Consult -he still is feeling weak, and states he doesn't feel much improved -right  lung fields have rhonchi today      Relevant Orders   AMB Referral to [facility]   CBC with Differential/Platelet   CMP14+EGFR     Musculoskeletal and Integument   Muscular deconditioning    -had 04/02/21 hospitalization and has been completing PT, but he still has weakness -he refuses to go back to emergency department today and doesn't meet CURB-65 criteria for admission -his wife "is a heart patient and can't pick me up", and he fell on Saturday and had to crawl around until he could find a chair to use to pull himself up -he is amenable to going to short-term rehab -referral to social work to try to  get him in without going through hospitalization      Relevant Orders   AMB Referral to [facility]     Other   Fatigue    -pt requests a testosterone check d/t his fatigue -suggested deconditioning and his current pneumonia are likely cause of his fatigue, but will honor his request and check testosterone      Relevant Orders   Testosterone, Free, Total, SHBG   Frequent falls    -fell again on Saturday 05/09/21 -is doing outpatient PT/OT, but is still unsteady, even with walker -recommend short-term rehab, and he is amenable to this -referral to SW -pt refuses hospitalization today      Relevant Orders   AMB Referral to [place]     No orders of the defined types were placed in this encounter.  Time spent: 37 minutes  Noreene Larsson, NP

## 2021-05-12 NOTE — Assessment & Plan Note (Addendum)
-  had 04/02/21 hospitalization and has been completing PT, but he still has weakness -he refuses to go back to emergency department today and doesn't meet CURB-65 criteria for admission -his wife "is a heart patient and can't pick me up", and he fell on Saturday and had to crawl around until he could find a chair to use to pull himself up -he is amenable to going to short-term rehab -referral to social work to try to get him in without going through hospitalization

## 2021-05-12 NOTE — Assessment & Plan Note (Addendum)
-  fell again on Saturday 05/09/21 -is doing outpatient PT/OT, but is still unsteady, even with walker -recommend short-term rehab, and he is amenable to this -referral to SW -pt refuses hospitalization today

## 2021-05-12 NOTE — Assessment & Plan Note (Signed)
-  pt requests a testosterone check d/t his fatigue -suggested deconditioning and his current pneumonia are likely cause of his fatigue, but will honor his request and check testosterone

## 2021-05-13 ENCOUNTER — Encounter (HOSPITAL_COMMUNITY): Payer: HMO | Admitting: Occupational Therapy

## 2021-05-13 ENCOUNTER — Ambulatory Visit (HOSPITAL_COMMUNITY): Payer: HMO

## 2021-05-13 ENCOUNTER — Telehealth: Payer: Self-pay | Admitting: *Deleted

## 2021-05-13 DIAGNOSIS — G2581 Restless legs syndrome: Secondary | ICD-10-CM | POA: Diagnosis not present

## 2021-05-13 DIAGNOSIS — Z79899 Other long term (current) drug therapy: Secondary | ICD-10-CM | POA: Diagnosis not present

## 2021-05-13 DIAGNOSIS — G253 Myoclonus: Secondary | ICD-10-CM | POA: Diagnosis not present

## 2021-05-13 DIAGNOSIS — R296 Repeated falls: Secondary | ICD-10-CM | POA: Diagnosis not present

## 2021-05-13 LAB — CMP14+EGFR
ALT: 8 IU/L (ref 0–44)
AST: 15 IU/L (ref 0–40)
Albumin/Globulin Ratio: 1.8 (ref 1.2–2.2)
Albumin: 4.6 g/dL (ref 3.7–4.7)
Alkaline Phosphatase: 71 IU/L (ref 44–121)
BUN/Creatinine Ratio: 24 (ref 10–24)
BUN: 18 mg/dL (ref 8–27)
Bilirubin Total: 0.4 mg/dL (ref 0.0–1.2)
CO2: 24 mmol/L (ref 20–29)
Calcium: 9.1 mg/dL (ref 8.6–10.2)
Chloride: 98 mmol/L (ref 96–106)
Creatinine, Ser: 0.76 mg/dL (ref 0.76–1.27)
Globulin, Total: 2.5 g/dL (ref 1.5–4.5)
Glucose: 173 mg/dL — ABNORMAL HIGH (ref 65–99)
Potassium: 3.6 mmol/L (ref 3.5–5.2)
Sodium: 139 mmol/L (ref 134–144)
Total Protein: 7.1 g/dL (ref 6.0–8.5)
eGFR: 93 mL/min/{1.73_m2} (ref >59)

## 2021-05-13 LAB — CBC WITH DIFFERENTIAL/PLATELET
Basophils Absolute: 0 10*3/uL (ref 0.0–0.2)
Basos: 0 %
EOS (ABSOLUTE): 0.1 10*3/uL (ref 0.0–0.4)
Eos: 1 %
Hematocrit: 31.6 % — ABNORMAL LOW (ref 37.5–51.0)
Hemoglobin: 11 g/dL — ABNORMAL LOW (ref 13.0–17.7)
Immature Grans (Abs): 0.1 10*3/uL (ref 0.0–0.1)
Immature Granulocytes: 1 %
Lymphocytes Absolute: 0.9 10*3/uL (ref 0.7–3.1)
Lymphs: 7 %
MCH: 33.6 pg — ABNORMAL HIGH (ref 26.6–33.0)
MCHC: 34.8 g/dL (ref 31.5–35.7)
MCV: 97 fL (ref 79–97)
Monocytes Absolute: 0.7 10*3/uL (ref 0.1–0.9)
Monocytes: 5 %
Neutrophils Absolute: 10.6 10*3/uL — ABNORMAL HIGH (ref 1.4–7.0)
Neutrophils: 86 %
Platelets: 209 10*3/uL (ref 150–450)
RBC: 3.27 x10E6/uL — ABNORMAL LOW (ref 4.14–5.80)
RDW: 14.5 % (ref 11.6–15.4)
WBC: 12.3 10*3/uL — ABNORMAL HIGH (ref 3.4–10.8)

## 2021-05-13 NOTE — Chronic Care Management (AMB) (Signed)
  Chronic Care Management   Outreach Note  05/13/2021 Name: MARKAEL HYDER MRN: [id number] DOB: [birth year]  ADAR BILLARD is a 75 y.o. year old male who is a primary care patient of Noreene Larsson, NP. I reached out to [name] by phone today in response to a referral sent by Mr. Markesan PCP, Noreene Larsson, NP      An unsuccessful telephone outreach was attempted today. The patient was referred to the case management team for assistance with care management and care coordination.   Follow Up Plan: A HIPAA compliant phone message was left for the patient providing contact information and requesting a return call. The care management team will reach out to the patient again over the next 7 days.  If patient returns call to provider office, please advise to call [place] at [phone].  [place] Management  Direct Dial: [phone]

## 2021-05-13 NOTE — Progress Notes (Signed)
It is his call. If he feels worse, then he needs their medicine. If he is feeling better and gradually improving, then he should stay at home.

## 2021-05-13 NOTE — Progress Notes (Signed)
His WBC count is elevated again despite taking antibiotics. If he feels worse, he should go to the hospital where they can give him IV antibiotics. Because when the antibiotics that he has been taking by mouth aren't cutting it, the IV abx should help fight the pneumonia better.

## 2021-05-14 ENCOUNTER — Other Ambulatory Visit: Payer: Self-pay

## 2021-05-14 ENCOUNTER — Ambulatory Visit: Payer: HMO | Admitting: Nurse Practitioner

## 2021-05-14 ENCOUNTER — Telehealth: Payer: Self-pay | Admitting: Nurse Practitioner

## 2021-05-14 ENCOUNTER — Encounter (HOSPITAL_COMMUNITY): Payer: HMO

## 2021-05-14 ENCOUNTER — Ambulatory Visit: Payer: HMO | Admitting: Podiatry

## 2021-05-14 DIAGNOSIS — E1159 Type 2 diabetes mellitus with other circulatory complications: Secondary | ICD-10-CM

## 2021-05-14 DIAGNOSIS — E08621 Diabetes mellitus due to underlying condition with foot ulcer: Secondary | ICD-10-CM

## 2021-05-14 DIAGNOSIS — E1142 Type 2 diabetes mellitus with diabetic polyneuropathy: Secondary | ICD-10-CM

## 2021-05-14 DIAGNOSIS — L97412 Non-pressure chronic ulcer of right heel and midfoot with fat layer exposed: Secondary | ICD-10-CM

## 2021-05-14 NOTE — Telephone Encounter (Signed)
Patient needs to speak with with Dr. Pearline Cables nurse about his bloodwork, and about the note to the wound center about his foot they still haven't received anything yet.   Please call him back

## 2021-05-14 NOTE — Chronic Care Management (AMB) (Signed)
  Chronic Care Management   Note  05/14/2021 Name: [name] MRN: [id number] DOB: 03-05-1946  [name] is a 75 y.o. year old male who is a primary care patient of Noreene Larsson, NP. I reached out to [name] by phone today in response to a referral sent by [name] PCP, Noreene Larsson, NP.      [name] was given information about Chronic Care Management services today including:  CCM service includes personalized support from designated clinical staff supervised by his physician, including individualized plan of care and coordination with other care providers 24/7 contact phone numbers for assistance for urgent and routine care needs. Service will only be billed when office clinical staff spend 20 minutes or more in a month to coordinate care. Only one practitioner may furnish and bill the service in a calendar month. The patient may stop CCM services at any time (effective at the end of the month) by phone call to the office staff. The patient will be responsible for cost sharing (co-pay) of up to 20% of the service fee (after annual deductible is met).  Patient agreed to services and verbal consent obtained.   Follow up plan: Telephone appointment with care management team member scheduled for:05/19/21  [place] Management  Direct Dial: [phone]

## 2021-05-14 NOTE — Chronic Care Management (AMB) (Signed)
  Chronic Care Management   Outreach Note  05/14/2021 Name: CONWAY MOYNIHAN MRN: [id number] DOB: [birth year]  SANDOR TAORMINA is a 75 y.o. year old male who is a primary care patient of Noreene Larsson, NP. I reached out to [name] by phone today in response to a referral sent by Mr. [place]cer PCP, Noreene Larsson, NP.      A second unsuccessful telephone outreach was attempted today. The patient was referred to the case management team for assistance with care management and care coordination.   Follow Up Plan: The care management team will reach out to the patient again over the next 1 days. If patient returns call to provider office, please advise to call [place] at [phone].  [place] Management  Direct Dial: [phone]

## 2021-05-14 NOTE — Telephone Encounter (Signed)
Left message

## 2021-05-15 ENCOUNTER — Encounter (HOSPITAL_COMMUNITY): Payer: Self-pay | Admitting: Occupational Therapy

## 2021-05-15 ENCOUNTER — Ambulatory Visit (HOSPITAL_COMMUNITY): Payer: HMO | Admitting: Occupational Therapy

## 2021-05-15 ENCOUNTER — Ambulatory Visit (HOSPITAL_COMMUNITY): Payer: HMO | Admitting: Physical Therapy

## 2021-05-15 DIAGNOSIS — M25612 Stiffness of left shoulder, not elsewhere classified: Secondary | ICD-10-CM

## 2021-05-15 DIAGNOSIS — M25512 Pain in left shoulder: Secondary | ICD-10-CM

## 2021-05-15 DIAGNOSIS — G8929 Other chronic pain: Secondary | ICD-10-CM

## 2021-05-15 DIAGNOSIS — R2681 Unsteadiness on feet: Secondary | ICD-10-CM

## 2021-05-15 DIAGNOSIS — R262 Difficulty in walking, not elsewhere classified: Secondary | ICD-10-CM

## 2021-05-15 DIAGNOSIS — M6281 Muscle weakness (generalized): Secondary | ICD-10-CM | POA: Diagnosis not present

## 2021-05-15 DIAGNOSIS — R29898 Other symptoms and signs involving the musculoskeletal system: Secondary | ICD-10-CM

## 2021-05-15 NOTE — Telephone Encounter (Signed)
Spoke with pt wife. He is also at his appt today for his foot.

## 2021-05-15 NOTE — Therapy (Signed)
[facility] [address] [place], [place], [address] Phone: [phone]   Fax:  [phone]  Occupational Therapy Treatment  Patient Details  Name: [name] MRN: [id number] Date of Birth: 1945/11/26 Referring Provider (OT): Demetrius Revel, NP   Encounter Date: 05/15/2021   OT End of Session - 05/15/21 1025     Visit Number 3    Number of Visits 8    Date for OT Re-Evaluation 05/30/21    Authorization Type [employer]; $30 copay    Authorization Time Period no visit limit    Progress Note Due on Visit 10    OT Start Time 0944    OT Stop Time 1026    OT Time Calculation (min) 42 min    Activity Tolerance Patient tolerated treatment well    Behavior During Therapy [facility] for tasks assessed/performed             Past Medical History:  Diagnosis Date   Aftercare following surgery of the circulatory system, NEC 12/04/2013   Angina decubitus ([place]) 05/19/2016   Arrhythmia 05/20/2016   Arthritis    ARTHRITIS, RIGHT FOOT 06/26/2008   Qualifier: Diagnosis of  By: Aline Brochure MD, [name]     Cardiomyopathy- EF NL 01/2013, now 30-35% echo 03/14/2012   Carotid artery occlusion    left s/p CEA   Cellulitis of left foot 05/07/2018   CHF (congestive heart failure) ([place])    Critical lower limb ischemia ([place]) 02/28/2014   Critical limb ischemia    Diabetes mellitus    Elevated troponin 04/11/2016   GERD (gastroesophageal reflux disease)    History of stroke June 2013 03/14/2012   HOH (hard of hearing)    Hypertension    Hypothyroidism    Nonhealing skin ulcer ([place]) 10/28/2014   Obstructive sleep apnea    Orthostatic hypotension    Osteomyelitis ([place])    left great toe   Pain in the chest 05/17/2016   Peripheral arterial disease ([place]), s/p PTA x 2 RLE    nonhealing ulcers bilaterally on each great toe   Pneumonia    PONV (postoperative nausea and vomiting)    Pulmonary nodule 05/17/2016   Restless leg syndrome    Shortness of breath     Stroke [facility]) March 08, 2012   Toe osteomyelitis, left [facility])    Ulcer of great toe, left, with necrosis of bone ([place])    Wears dentures     Past Surgical History:  Procedure Laterality Date   AMPUTATION TOE Left 07/25/2018   Procedure: AMPUTATION TOE INTERPHALANGEAL HALLUX LEFT;  Surgeon: Evelina Bucy, DPM;  Location: [facility];  Service: Podiatry;  Laterality: Left;   ANGIOPLASTY  02/28/14   diamond back orbital rotational atherectomy of Rt. tibial   BACK SURGERY     BONE BIOPSY Left 07/25/2018   Procedure: SUPERFICIAL BONE BIOPSY;  Surgeon: Evelina Bucy, DPM;  Location: [facility];  Service: Podiatry;  Laterality: Left;   CARDIAC CATHETERIZATION N/A 05/19/2016   Procedure: Right/Left Heart Cath and Coronary Angiography;  Surgeon: Belva Crome, MD;  Location: [facility] CV LAB;  Service: Cardiovascular;  Laterality: N/A;   CERVICAL FUSION     ENDARTERECTOMY Left 11/29/2013   Procedure: ENDARTERECTOMY CAROTID;  Surgeon: Serafina Mitchell, MD;  Location: [facility];  Service: Vascular;  Laterality: Left;   ESOPHAGOGASTRODUODENOSCOPY  02/2010   Dr. Gala Romney: patient presented with food impaction, schatzki ring with superimposed component of stricture with erosive reflux esophagitis, s/p disimpaction  but dilation planned at later date    ESOPHAGOGASTRODUODENOSCOPY (EGD) WITH PROPOFOL N/A 08/13/2019   Dr. Gala Romney: Erosive reflux esophagitis with mild stricture and incidental Mallory-Weiss tear which precluded esophageal dilation.  Medium sized hiatal hernia.   ESOPHAGOGASTRODUODENOSCOPY (EGD) WITH PROPOFOL N/A 10/11/2019   Dr. Gala Romney: Esophageal stenosis status post dilation, moderate hiatal hernia   EYE SURGERY     FLEXIBLE SIGMOIDOSCOPY N/A 08/13/2019   Procedure: FLEXIBLE SIGMOIDOSCOPY;  Surgeon: [name], MD;  [facility];  Service: Endoscopy;  Laterality: N/A;  colonoscopy aboerted due to formed stool and poor prep   FOOT SURGERY     KNEE ARTHROSCOPY WITH MEDIAL MENISECTOMY Left  05/06/2020   Procedure: KNEE ARTHROSCOPY WITH MEDIAL MENISCECTOMY AND LATERAL MENISCECTOMY;  Surgeon: [name], MD;  [facility]: [facility];  Service: Orthopedics;  Laterality: Left;   Lower ext duplex doppler  03/14/14   Rt ABI 1.2   LOWER EXTREMITY ANGIOGRAM Bilateral 02/18/2014   Procedure: LOWER EXTREMITY ANGIOGRAM;  Surgeon: [name], MD;  Location: [facility] CATH LAB;  Service: Cardiovascular;  Laterality: Bilateral;   LOWER EXTREMITY ANGIOGRAM N/A 10/31/2014   Procedure: LOWER EXTREMITY ANGIOGRAM;  Surgeon: [name], MD;  Location: [facility] CATH LAB;  Service: Cardiovascular;  Laterality: N/A;   MALONEY DILATION N/A 10/11/2019   Procedure: Venia Minks DILATION;  Surgeon: [name], MD;  [facility];  Service: Endoscopy;  Laterality: N/A;   MULTIPLE TOOTH EXTRACTIONS     PV angiogram  02/18/2014   tibial vessel diseas bil.   SPINE SURGERY     tendon achillies lengthing and sesamoid      There were no vitals filed for this visit.   Subjective Assessment - 05/15/21 0942     Subjective  S: I was in the hospital a week ago and I'm weak.    Currently in Pain? No/denies                [facility] OT Assessment - 05/15/21 0942       Assessment   Medical Diagnosis Chronic left shoulder pain      Precautions   Precautions Fall                      OT Treatments/Exercises (OP) - 05/15/21 0949       Exercises   Exercises Shoulder      Shoulder Exercises: Supine   Protraction PROM;AROM;10 reps    Horizontal ABduction PROM;AROM;10 reps    External Rotation PROM;AROM;10 reps    Internal Rotation PROM;AROM;10 reps    Flexion PROM;AROM;10 reps    ABduction PROM;AROM;10 reps      Shoulder Exercises: Seated   Row --    Protraction AROM;10 reps    Horizontal ABduction AROM;10 reps    External Rotation AROM;10 reps    Internal Rotation AROM;10 reps    Flexion AROM;10 reps;Limitations    Flexion Limitations to 90 degrees      Shoulder Exercises:  ROM/Strengthening   Proximal Shoulder Strengthening, Supine 10X each, no rest breaks      Functional Reaching Activities   Mid Level Pt completing pinch tree, placing pins along vertical bar. Pt able to place 18 pins to slightly greater than 90 degrees ROM.      Manual Therapy   Manual Therapy Myofascial release    Manual therapy comments Manual therapy completed prior to exercises    Myofascial Release Myofascial release and manual stretching completed to left upper arm,  upper trapezius, and scapularis region to decrease fascial restrictions and increase joint mobility in a pain free zone.                      OT Short Term Goals - 05/04/21 1609       OT SHORT TERM GOAL #1   Title Pt will be provided with and educated on HEP to improve mobility of LUE required during ADL completion.    Time 4    Period Weeks    Status On-going    Target Date 05/30/21      OT SHORT TERM GOAL #2   Title Pt will decrease pain in LUE to 3/10 or less to improve ability to sleep for 3+ hours without waking due to pain.    Time 4    Period Weeks    Status On-going      OT SHORT TERM GOAL #3   Title Pt will decrease LUE fascial restrictions to min amounts or less to improve ability to complete functional reaching tasks.    Time 4    Period Weeks    Status On-going      OT SHORT TERM GOAL #4   Title Pt will increase LUE A/ROM to [facility] to improve ability to reach for clothes or items in closet.    Time 4    Period Weeks    Status On-going      OT SHORT TERM GOAL #5   Title Pt will increase LUE strength to 4/5 or greater to improve ability to lift cast iron skillet during meal prep tasks.    Time 4    Period Weeks    Status On-going                      Plan - 05/15/21 1015     Clinical Impression Statement A: Pt reports feeling very weak today. Has not been completing HEP due to health issues. Continued with myofascial release today, min fascial restrictions noted.  P/ROM completed, continued with A/ROM in supine and sitting, rest breaks provided as needed. Added proximal shoulder strengthening in supine. Did not complete wall wash due to pt fatigue and limited standing tolerance. Added pinch treen for functional reaching. Verbal cuing for form and technique.    Body Structure / Function / Physical Skills ADL;Endurance;UE functional use;Fascial restriction;Pain;ROM;IADL;Strength    Plan P: myofascial release prn, functional reaching, wall wash    OT Home Exercise Plan 8/16: A/ROM    Consulted and Agree with Plan of Care Patient             Patient will benefit from skilled therapeutic intervention in order to improve the following deficits and impairments:   Body Structure / Function / Physical Skills: ADL, Endurance, UE functional use, Fascial restriction, Pain, ROM, IADL, Strength       Visit Diagnosis: Stiffness of left shoulder, not elsewhere classified  Chronic left shoulder pain  Other symptoms and signs involving the musculoskeletal system    Problem List Patient Active Problem List   Diagnosis Date Noted   Muscular deconditioning 05/12/2021   Frequent falls 05/12/2021   Fatigue 04/29/2021   Recent unexplained weight loss 04/23/2021   Left shoulder pain 04/23/2021   Chest tightness 04/23/2021   Deep tissue injury 04/23/2021   Myoclonus 04/15/2021   Pneumonia 04/03/2021   PNA (pneumonia) 04/02/2021   Abnormal finding on urinalysis 04/01/2021   Syncope 02/16/2021   Minor head injury  Right hip pain 02/11/2021   Erectile dysfunction 01/22/2021   Gait abnormality 09/30/2020   Mild non proliferative diabetic retinopathy ([place]) 07/16/2020   Need for immunization against influenza 06/03/2020   S/P left knee arthroscopy 05/06/20 05/13/2020   Abnormal MRI 03/26/2020   Joint disorder of knee 03/26/2020   Fall at home, initial encounter 03/13/2020   Anemia 09/03/2019   Reflux esophagitis 09/03/2019   Esophageal dysphagia  05/30/2019   Constipation 05/30/2019   Positive colorectal cancer screening using Cologuard test 05/30/2019   Diabetic foot ulcer ([place]) 09/06/2018   Orthostasis 08/25/2016   Congestive heart failure ([place]) 05/17/2016   Bilateral carotid artery disease ([place]) 07/08/2014   Obstructive sleep apnea 02/12/2014   Restless legs 10/23/2013   Overweight with body mass index (BMI) of 29 to 29.9 in adult 10/23/2013   Hyperlipidemia 07/16/2013   Peripheral arterial disease ([place]) 07/16/2013   Hypertension 03/14/2012   Type 2 diabetes mellitus with circulatory disorder ([employer]) 03/14/2012   Peripheral neuropathy 03/14/2012   Cardiomyopathy- EF NL 01/2013, now 30-35% echo 03/14/2012    Guadelupe Sabin, OTR/L  [phone] 05/15/2021, 10:30 AM  [facility] [place], [place], [address] Phone: [phone]   Fax:  [phone]  Name: LARRELL FREDIANI MRN: [id number] Date of Birth: 25-Dec-1945

## 2021-05-15 NOTE — Therapy (Addendum)
[facility] [address] [place], [place], [address] Phone: [phone]   Fax:  [phone]  Physical Therapy Treatment  Patient Details  Name: [name] MRN: [id number] Date of Birth: 1945-11-19 Referring Provider (PT): Noreene Larsson, NP   Encounter Date: 05/15/2021   PT End of Session - 05/15/21 1103     Visit Number 9    Number of Visits 12    Date for PT Re-Evaluation 05/18/21    Authorization Type [employer], no VL, no auth ,    Progress Note Due on Visit 10    PT Start Time [id number]    PT Stop Time 1125    PT Time Calculation (min) 40 min    Equipment Utilized During Treatment Gait belt    Activity Tolerance Patient tolerated treatment well    Behavior During Therapy [facility] for tasks assessed/performed             Past Medical History:  Diagnosis Date   Aftercare following surgery of the circulatory system, NEC 12/04/2013   Angina decubitus ([place]) 05/19/2016   Arrhythmia 05/20/2016   Arthritis    ARTHRITIS, RIGHT FOOT 06/26/2008   Qualifier: Diagnosis of  By: Aline Brochure MD, [name]     Cardiomyopathy- EF NL 01/2013, now 30-35% echo 03/14/2012   Carotid artery occlusion    left s/p CEA   Cellulitis of left foot 05/07/2018   CHF (congestive heart failure) ([place])    Critical lower limb ischemia ([place]) 02/28/2014   Critical limb ischemia    Diabetes mellitus    Elevated troponin 04/11/2016   GERD (gastroesophageal reflux disease)    History of stroke June 2013 03/14/2012   HOH (hard of hearing)    Hypertension    Hypothyroidism    Nonhealing skin ulcer ([place]) 10/28/2014   Obstructive sleep apnea    Orthostatic hypotension    Osteomyelitis ([place])    left great toe   Pain in the chest 05/17/2016   Peripheral arterial disease ([place]), s/p PTA x 2 RLE    nonhealing ulcers bilaterally on each great toe   Pneumonia    PONV (postoperative nausea and vomiting)    Pulmonary nodule 05/17/2016   Restless leg syndrome     Shortness of breath    Stroke [facility]) March 08, 2012   Toe osteomyelitis, left [facility])    Ulcer of great toe, left, with necrosis of bone ([place])    Wears dentures     Past Surgical History:  Procedure Laterality Date   AMPUTATION TOE Left 07/25/2018   Procedure: AMPUTATION TOE INTERPHALANGEAL HALLUX LEFT;  Surgeon: Evelina Bucy, DPM;  Location: [facility];  Service: Podiatry;  Laterality: Left;   ANGIOPLASTY  02/28/14   diamond back orbital rotational atherectomy of Rt. tibial   BACK SURGERY     BONE BIOPSY Left 07/25/2018   Procedure: SUPERFICIAL BONE BIOPSY;  Surgeon: Evelina Bucy, DPM;  Location: [facility];  Service: Podiatry;  Laterality: Left;   CARDIAC CATHETERIZATION N/A 05/19/2016   Procedure: Right/Left Heart Cath and Coronary Angiography;  Surgeon: Belva Crome, MD;  Location: [facility] CV LAB;  Service: Cardiovascular;  Laterality: N/A;   CERVICAL FUSION     ENDARTERECTOMY Left 11/29/2013   Procedure: ENDARTERECTOMY CAROTID;  Surgeon: Serafina Mitchell, MD;  Location: [facility];  Service: Vascular;  Laterality: Left;   ESOPHAGOGASTRODUODENOSCOPY  02/2010   Dr. Gala Romney: patient presented with food impaction, schatzki ring with superimposed component of stricture with erosive  reflux esophagitis, s/p disimpaction but dilation planned at later date    ESOPHAGOGASTRODUODENOSCOPY (EGD) WITH PROPOFOL N/A 08/13/2019   Dr. Gala Romney: Erosive reflux esophagitis with mild stricture and incidental Mallory-Weiss tear which precluded esophageal dilation.  Medium sized hiatal hernia.   ESOPHAGOGASTRODUODENOSCOPY (EGD) WITH PROPOFOL N/A 10/11/2019   Dr. Gala Romney: Esophageal stenosis status post dilation, moderate hiatal hernia   EYE SURGERY     FLEXIBLE SIGMOIDOSCOPY N/A 08/13/2019   Procedure: FLEXIBLE SIGMOIDOSCOPY;  Surgeon: [name], MD;  [facility];  Service: Endoscopy;  Laterality: N/A;  colonoscopy aboerted due to formed stool and poor prep   FOOT SURGERY     KNEE ARTHROSCOPY WITH MEDIAL  MENISECTOMY Left 05/06/2020   Procedure: KNEE ARTHROSCOPY WITH MEDIAL MENISCECTOMY AND LATERAL MENISCECTOMY;  Surgeon: [name], MD;  [facility];  Service: Orthopedics;  Laterality: Left;   Lower ext duplex doppler  03/14/14   Rt ABI 1.2   LOWER EXTREMITY ANGIOGRAM Bilateral 02/18/2014   Procedure: LOWER EXTREMITY ANGIOGRAM;  Surgeon: [name], MD;  Location: [facility] CATH LAB;  Service: Cardiovascular;  Laterality: Bilateral;   LOWER EXTREMITY ANGIOGRAM N/A 10/31/2014   Procedure: LOWER EXTREMITY ANGIOGRAM;  Surgeon: [name], MD;  Location: [facility] CATH LAB;  Service: Cardiovascular;  Laterality: N/A;   MALONEY DILATION N/A 10/11/2019   Procedure: Venia Minks DILATION;  Surgeon: [name], MD;  [facility];  Service: Endoscopy;  Laterality: N/A;   MULTIPLE TOOTH EXTRACTIONS     PV angiogram  02/18/2014   tibial vessel diseas bil.   SPINE SURGERY     tendon achillies lengthing and sesamoid      There were no vitals filed for this visit.   Subjective Assessment - 05/15/21 1048     Subjective PT states that he has been in and out of the hospital.  His white blood count is up and the MD do not know why.  He went to the foot MD who states that his woulnd looks good.  Energy wist he is shot.    Pertinent History CHF , R foot wound, left knee scope, DB, Restless leg syndrome, hx of stroke.    Limitations House hold activities;Standing;Lifting    How long can you walk comfortably? 10    Patient Stated Goals be able to walk normally and function correctly.    Currently in Pain? No/denies   his restless legs is bothering him but it is not so much pain and irritation                              [facility] Adult PT Treatment/Exercise - 05/15/21 1054       Exercises   Exercises Knee/Hip      Knee/Hip Exercises: Standing   Other Standing Knee Exercises Lt hip hike      Knee/Hip Exercises: Seated   Long Arc Quad Strengthening;Both;2 sets;5 reps     Long Arc Quad Weight 3 lbs.    Sit to Sand 2 sets;10 reps;without UE support   elevated surface     Knee/Hip Exercises: Supine   Bridges Both;10 reps    Other Supine Knee/Hip Exercises Isometric hip ab/adduction x 10    Other Supine Knee/Hip Exercises dead bug x 10; clams x 10  PT Short Term Goals - 04/06/21 1026       PT SHORT TERM GOAL #1   Title Patient will be independent in self management strategies to improve quality of life and functional outcomes.    Time 3    Period Weeks    Status New    Target Date 04/27/21      PT SHORT TERM GOAL #2   Title Patient will be able to report at least 25% improvement in overall mobility.    Time 3    Period Weeks    Status New    Target Date 04/27/21      PT SHORT TERM GOAL #3   Title Demo improved balance and BLE strength as evidenced by time of 18 sec 5xSTS test    Baseline 26 sec with BUE support    Time 3    Period Weeks    Status New    Target Date 04/27/21               PT Long Term Goals - 04/06/21 1029       PT LONG TERM GOAL #1   Title Patient will improve FOTO score by at least 10 points in order to indicate improved tolerance to activity.    Baseline 50% function    Time 6    Period Weeks    Status New    Target Date 05/18/21      PT LONG TERM GOAL #2   Title Demo improved gait velocity and safety as evidenced by distance of 150 ft during 2MWT    Baseline 60 ft with rollator    Time 6    Period Weeks    Status New    Target Date 05/18/21                   Plan - 05/15/21 1103     Clinical Impression Statement PT has been gone for 2 weeks due to medical complication..  Added wt to LAQ, began side steping and dead bug exercise to improve core strength.  Pt has difficulty with side stepping fatiguing quickly but able to tolerate other exercises well .    Personal Factors and Comorbidities Comorbidity 1;Comorbidity  3+;Comorbidity 2    Comorbidities CHF, left knee surgery, right chronic foot wound, DB, restless leg syndromes    Examination-Activity Limitations Bathing;Lift;Locomotion Level;Transfers;Stand;Stairs;Squat;Reach Overhead;Carry    Examination-Participation Restrictions Community Activity;Meal Prep    Stability/Clinical Decision Making Evolving/Moderate complexity    Clinical Decision Making Moderate    Rehab Potential Fair    PT Frequency 2x / week    PT Duration 6 weeks    PT Treatment/Interventions ADLs/Self Care Home Management;Other (comment);Moist Heat;Balance training;Therapeutic exercise;Therapeutic activities;Functional mobility training;Stair training;Gait training;DME Instruction;Ultrasound;Neuromuscular re-education;Patient/family education;Orthotic Fit/Training;Manual techniques;Taping;Passive range of motion;Electrical Stimulation    PT Next Visit Plan Re assess next treatment along with recert.             Patient will benefit from skilled therapeutic intervention in order to improve the following deficits and impairments:  Abnormal gait, Decreased endurance, Decreased skin integrity, Increased edema, Decreased activity tolerance, Decreased balance, Decreased mobility, Decreased knowledge of use of DME, Decreased strength, Difficulty walking, Pain, Decreased range of motion, Impaired flexibility, Postural dysfunction, Improper body mechanics  Visit Diagnosis: Muscle weakness (generalized)  Difficulty in walking, not elsewhere classified  Unsteadiness on feet     Problem List Patient Active Problem List   Diagnosis Date Noted   Muscular deconditioning 05/12/2021  Frequent falls 05/12/2021   Fatigue 04/29/2021   Recent unexplained weight loss 04/23/2021   Left shoulder pain 04/23/2021   Chest tightness 04/23/2021   Deep tissue injury 04/23/2021   Myoclonus 04/15/2021   Pneumonia 04/03/2021   PNA (pneumonia) 04/02/2021   Abnormal finding on urinalysis 04/01/2021    Syncope 02/16/2021   Minor head injury    Right hip pain 02/11/2021   Erectile dysfunction 01/22/2021   Gait abnormality 09/30/2020   Mild non proliferative diabetic retinopathy ([place]) 07/16/2020   Need for immunization against influenza 06/03/2020   S/P left knee arthroscopy 05/06/20 05/13/2020   Abnormal MRI 03/26/2020   Joint disorder of knee 03/26/2020   Fall at home, initial encounter 03/13/2020   Anemia 09/03/2019   Reflux esophagitis 09/03/2019   Esophageal dysphagia 05/30/2019   Constipation 05/30/2019   Positive colorectal cancer screening using Cologuard test 05/30/2019   Diabetic foot ulcer ([place]) 09/06/2018   Orthostasis 08/25/2016   Congestive heart failure ([place]) 05/17/2016   Bilateral carotid artery disease ([place]) 07/08/2014   Obstructive sleep apnea 02/12/2014   Restless legs 10/23/2013   Overweight with body mass index (BMI) of 29 to 29.9 in adult 10/23/2013   Hyperlipidemia 07/16/2013   Peripheral arterial disease ([place]) 07/16/2013   Hypertension 03/14/2012   Type 2 diabetes mellitus with circulatory disorder ([place]) 03/14/2012   Peripheral neuropathy 03/14/2012   Cardiomyopathy- EF NL 01/2013, now 30-35% echo 03/14/2012   Rayetta Humphrey, PT CLT [phone]  05/15/2021, 11:28 AM  [facility] [address] [place], [place], [address] Phone: [phone]   Fax:  [phone]  Name: [name] MRN: [id number] Date of Birth: Feb 18, 1946

## 2021-05-15 NOTE — Patient Instructions (Signed)

## 2021-05-16 ENCOUNTER — Other Ambulatory Visit (HOSPITAL_COMMUNITY): Payer: Self-pay

## 2021-05-16 ENCOUNTER — Encounter (HOSPITAL_COMMUNITY): Payer: Self-pay | Admitting: Emergency Medicine

## 2021-05-16 ENCOUNTER — Emergency Department (HOSPITAL_COMMUNITY): Payer: HMO

## 2021-05-16 ENCOUNTER — Emergency Department (HOSPITAL_COMMUNITY)
Admission: EM | Admit: 2021-05-16 | Discharge: 2021-05-16 | Disposition: A | Discharge status: Home / Self Care | Payer: HMO | Attending: Emergency Medicine | Admitting: Emergency Medicine

## 2021-05-16 ENCOUNTER — Other Ambulatory Visit: Payer: Self-pay

## 2021-05-16 DIAGNOSIS — I509 Heart failure, unspecified: Secondary | ICD-10-CM | POA: Diagnosis not present

## 2021-05-16 DIAGNOSIS — I110 Hypertensive heart disease with heart failure: Secondary | ICD-10-CM | POA: Diagnosis not present

## 2021-05-16 DIAGNOSIS — R0602 Shortness of breath: Secondary | ICD-10-CM | POA: Insufficient documentation

## 2021-05-16 DIAGNOSIS — Z89422 Acquired absence of other left toe(s): Secondary | ICD-10-CM | POA: Diagnosis not present

## 2021-05-16 DIAGNOSIS — Z87891 Personal history of nicotine dependence: Secondary | ICD-10-CM | POA: Insufficient documentation

## 2021-05-16 DIAGNOSIS — Z7902 Long term (current) use of antithrombotics/antiplatelets: Secondary | ICD-10-CM | POA: Diagnosis not present

## 2021-05-16 DIAGNOSIS — R55 Syncope and collapse: Secondary | ICD-10-CM

## 2021-05-16 DIAGNOSIS — Z7982 Long term (current) use of aspirin: Secondary | ICD-10-CM | POA: Insufficient documentation

## 2021-05-16 DIAGNOSIS — E11621 Type 2 diabetes mellitus with foot ulcer: Secondary | ICD-10-CM | POA: Diagnosis not present

## 2021-05-16 DIAGNOSIS — E113599 Type 2 diabetes mellitus with proliferative diabetic retinopathy without macular edema, unspecified eye: Secondary | ICD-10-CM | POA: Insufficient documentation

## 2021-05-16 DIAGNOSIS — L97409 Non-pressure chronic ulcer of unspecified heel and midfoot with unspecified severity: Secondary | ICD-10-CM | POA: Diagnosis not present

## 2021-05-16 DIAGNOSIS — R110 Nausea: Secondary | ICD-10-CM | POA: Diagnosis not present

## 2021-05-16 DIAGNOSIS — E039 Hypothyroidism, unspecified: Secondary | ICD-10-CM | POA: Diagnosis not present

## 2021-05-16 DIAGNOSIS — R42 Dizziness and giddiness: Secondary | ICD-10-CM | POA: Diagnosis not present

## 2021-05-16 DIAGNOSIS — Z79899 Other long term (current) drug therapy: Secondary | ICD-10-CM | POA: Insufficient documentation

## 2021-05-16 DIAGNOSIS — E1159 Type 2 diabetes mellitus with other circulatory complications: Secondary | ICD-10-CM | POA: Insufficient documentation

## 2021-05-16 LAB — CBC WITH DIFFERENTIAL/PLATELET
Abs Immature Granulocytes: 0.01 10*3/uL (ref 0.00–0.07)
Basophils Absolute: 0 10*3/uL (ref 0.0–0.1)
Basophils Relative: 1 %
Eosinophils Absolute: 0.1 10*3/uL (ref 0.0–0.5)
Eosinophils Relative: 2 %
HCT: 31.7 % — ABNORMAL LOW (ref 39.0–52.0)
Hemoglobin: 10.6 g/dL — ABNORMAL LOW (ref 13.0–17.0)
Immature Granulocytes: 0 %
Lymphocytes Relative: 27 %
Lymphs Abs: 0.9 10*3/uL (ref 0.7–4.0)
MCH: 34.2 pg — ABNORMAL HIGH (ref 26.0–34.0)
MCHC: 33.4 g/dL (ref 30.0–36.0)
MCV: 102.3 fL — ABNORMAL HIGH (ref 80.0–100.0)
Monocytes Absolute: 0.4 10*3/uL (ref 0.1–1.0)
Monocytes Relative: 11 %
Neutro Abs: 2.1 10*3/uL (ref 1.7–7.7)
Neutrophils Relative %: 59 %
Platelets: 215 10*3/uL (ref 150–400)
RBC: 3.1 MIL/uL — ABNORMAL LOW (ref 4.22–5.81)
RDW: 15.3 % (ref 11.5–15.5)
WBC: 3.5 10*3/uL — ABNORMAL LOW (ref 4.0–10.5)
nRBC: 0 % (ref 0.0–0.2)

## 2021-05-16 LAB — COMPREHENSIVE METABOLIC PANEL
ALT: 10 U/L (ref 0–44)
AST: 13 U/L — ABNORMAL LOW (ref 15–41)
Albumin: 3.7 g/dL (ref 3.5–5.0)
Alkaline Phosphatase: 52 U/L (ref 38–126)
Anion gap: 6 (ref 5–15)
BUN: 15 mg/dL (ref 8–23)
CO2: 31 mmol/L (ref 22–32)
Calcium: 7.8 mg/dL — ABNORMAL LOW (ref 8.9–10.3)
Chloride: 97 mmol/L — ABNORMAL LOW (ref 98–111)
Creatinine, Ser: 0.79 mg/dL (ref 0.61–1.24)
GFR, Estimated: >60 mL/min (ref >60)
Glucose, Bld: 229 mg/dL — ABNORMAL HIGH (ref 70–99)
Potassium: 3.2 mmol/L — ABNORMAL LOW (ref 3.5–5.1)
Sodium: 134 mmol/L — ABNORMAL LOW (ref 135–145)
Total Bilirubin: 0.6 mg/dL (ref 0.3–1.2)
Total Protein: 7 g/dL (ref 6.5–8.1)

## 2021-05-16 LAB — TROPONIN I (HIGH SENSITIVITY)
Troponin I (High Sensitivity): 5 ng/L (ref <18)
Troponin I (High Sensitivity): 5 ng/L (ref <18)

## 2021-05-16 LAB — BRAIN NATRIURETIC PEPTIDE: B Natriuretic Peptide: 174 pg/mL — ABNORMAL HIGH (ref 0.0–100.0)

## 2021-05-16 MED ORDER — SODIUM CHLORIDE 0.9 % IV BOLUS
500.0000 mL | Freq: Once | INTRAVENOUS | Status: AC
  Administered 2021-05-16: 500 mL via INTRAVENOUS

## 2021-05-16 MED ORDER — POTASSIUM CHLORIDE CRYS ER 20 MEQ PO TBCR
40.0000 meq | EXTENDED_RELEASE_TABLET | Freq: Once | ORAL | Status: AC
  Administered 2021-05-16: 40 meq via ORAL
  Filled 2021-05-16: qty 2

## 2021-05-16 NOTE — ED Notes (Signed)
Pt ambulated in room with no distress noted. Pt O2 remained 98% to 100% on RA, pt denies c/o SOB.

## 2021-05-16 NOTE — ED Triage Notes (Signed)
Patient c/o near syncope and nausea that happens with movement. Per patient started yesterday and is progressively getting worse. Patient has shortness of breath and generalized weakness but states that he has had pneumonia recently. Denies any chest pain.

## 2021-05-16 NOTE — ED Provider Notes (Signed)
[facility] EMERGENCY DEPARTMENT Provider Note   CSN: [id number] Arrival date & time: 05/16/21  1630     History Chief Complaint  Patient presents with   Near Syncope    [name] is a 75 y.o. male.  HPI  75 year old male with a history of arrhythmia, arthritis, cardiomyopathy, CHF, diabetes, GERD, hypertension, hypothyroidism, obstructive sleep apnea, orthostatic hypotension, osteomyelitis, peripheral arterial disease, pneumonia, CVA, who presents to the emergency department today for evaluation of near syncope.  Patient states he has been having a lot of health problems for the last month or so.  Today he states he went from sitting to standing and had a sensation that he was going to pass out.  He felt very lightheaded and nauseated.  He did not actually lose consciousness.  He did not have any associated chest pain or palpitations.  He has been short of breath after a recent diagnosis of pneumonia which he states is unchanged today.  States he has been eating and drinking normally and denies any vomiting or recent diarrhea.  Past Medical History:  Diagnosis Date   Aftercare following surgery of the circulatory system, NEC 12/04/2013   Angina decubitus ([place]) 05/19/2016   Arrhythmia 05/20/2016   Arthritis    ARTHRITIS, RIGHT FOOT 06/26/2008   Qualifier: Diagnosis of  By: Aline Brochure MD, [name]     Cardiomyopathy- EF NL 01/2013, now 30-35% echo 03/14/2012   Carotid artery occlusion    left s/p CEA   Cellulitis of left foot 05/07/2018   CHF (congestive heart failure) (HCC)    Critical lower limb ischemia ([place]) 02/28/2014   Critical limb ischemia    Diabetes mellitus    Elevated troponin 04/11/2016   GERD (gastroesophageal reflux disease)    History of stroke June 2013 03/14/2012   HOH (hard of hearing)    Hypertension    Hypothyroidism    Nonhealing skin ulcer ([place]) 10/28/2014   Obstructive sleep apnea    Orthostatic hypotension    Osteomyelitis (HCC)    left great toe   Pain  in the chest 05/17/2016   Peripheral arterial disease ([place]), s/p PTA x 2 RLE    nonhealing ulcers bilaterally on each great toe   Pneumonia    PONV (postoperative nausea and vomiting)    Pulmonary nodule 05/17/2016   Restless leg syndrome    Shortness of breath    Stroke [facility]) March 08, 2012   Toe osteomyelitis, left [facility])    Ulcer of great toe, left, with necrosis of bone ([place])    Wears dentures     Patient Active Problem List   Diagnosis Date Noted   Muscular deconditioning 05/12/2021   Frequent falls 05/12/2021   Fatigue 04/29/2021   Recent unexplained weight loss 04/23/2021   Left shoulder pain 04/23/2021   Chest tightness 04/23/2021   Deep tissue injury 04/23/2021   Myoclonus 04/15/2021   Pneumonia 04/03/2021   PNA (pneumonia) 04/02/2021   Abnormal finding on urinalysis 04/01/2021   Syncope 02/16/2021   Minor head injury    Right hip pain 02/11/2021   Erectile dysfunction 01/22/2021   Gait abnormality 09/30/2020   Mild non proliferative diabetic retinopathy ([place]) 07/16/2020   Need for immunization against influenza 06/03/2020   S/P left knee arthroscopy 05/06/20 05/13/2020   Abnormal MRI 03/26/2020   Joint disorder of knee 03/26/2020   Fall at home, initial encounter 03/13/2020   Anemia 09/03/2019   Reflux esophagitis 09/03/2019   Esophageal dysphagia 05/30/2019   Constipation 05/30/2019  Positive colorectal cancer screening using Cologuard test 05/30/2019   Diabetic foot ulcer ([place]) 09/06/2018   Orthostasis 08/25/2016   Congestive heart failure ([place]) 05/17/2016   Bilateral carotid artery disease ([place]) 07/08/2014   Obstructive sleep apnea 02/12/2014   Restless legs 10/23/2013   Overweight with body mass index (BMI) of 29 to 29.9 in adult 10/23/2013   Hyperlipidemia 07/16/2013   Peripheral arterial disease ([place]) 07/16/2013   Hypertension 03/14/2012   Type 2 diabetes mellitus with circulatory disorder ([place]) 03/14/2012   Peripheral neuropathy 03/14/2012    Cardiomyopathy- EF NL 01/2013, now 30-35% echo 03/14/2012    Past Surgical History:  Procedure Laterality Date   AMPUTATION TOE Left 07/25/2018   Procedure: AMPUTATION TOE INTERPHALANGEAL HALLUX LEFT;  Surgeon: Evelina Bucy, DPM;  Location: [place];  Service: Podiatry;  Laterality: Left;   ANGIOPLASTY  02/28/14   diamond back orbital rotational atherectomy of Rt. tibial   BACK SURGERY     BONE BIOPSY Left 07/25/2018   Procedure: SUPERFICIAL BONE BIOPSY;  Surgeon: Evelina Bucy, DPM;  Location: [place];  Service: Podiatry;  Laterality: Left;   CARDIAC CATHETERIZATION N/A 05/19/2016   Procedure: Right/Left Heart Cath and Coronary Angiography;  Surgeon: Belva Crome, MD;  Location: [place] [facility];  Service: Cardiovascular;  Laterality: N/A;   CERVICAL FUSION     ENDARTERECTOMY Left 11/29/2013   Procedure: ENDARTERECTOMY CAROTID;  Surgeon: Serafina Mitchell, MD;  Location: [place];  Service: Vascular;  Laterality: Left;   ESOPHAGOGASTRODUODENOSCOPY  02/2010   Dr. Gala Romney: patient presented with food impaction, schatzki ring with superimposed component of stricture with erosive reflux esophagitis, s/p disimpaction but dilation planned at later date    ESOPHAGOGASTRODUODENOSCOPY (EGD) WITH PROPOFOL N/A 08/13/2019   Dr. Gala Romney: Erosive reflux esophagitis with mild stricture and incidental Mallory-Weiss tear which precluded esophageal dilation.  Medium sized hiatal hernia.   ESOPHAGOGASTRODUODENOSCOPY (EGD) WITH PROPOFOL N/A 10/11/2019   Dr. Gala Romney: Esophageal stenosis status post dilation, moderate hiatal hernia   EYE SURGERY     FLEXIBLE SIGMOIDOSCOPY N/A 08/13/2019   Procedure: FLEXIBLE SIGMOIDOSCOPY;  Surgeon: [name], MD;  [facility];  Service: Endoscopy;  Laterality: N/A;  colonoscopy aboerted due to formed stool and poor prep   FOOT SURGERY     KNEE ARTHROSCOPY WITH MEDIAL MENISECTOMY Left 05/06/2020   Procedure: KNEE ARTHROSCOPY WITH MEDIAL MENISCECTOMY AND LATERAL  MENISCECTOMY;  Surgeon: [name], MD;  Location: [facility];  Service: Orthopedics;  Laterality: Left;   Lower ext duplex doppler  03/14/14   Rt ABI 1.2   LOWER EXTREMITY ANGIOGRAM Bilateral 02/18/2014   Procedure: LOWER EXTREMITY ANGIOGRAM;  Surgeon: [name], MD;  Location: [facility] CATH LAB;  Service: Cardiovascular;  Laterality: Bilateral;   LOWER EXTREMITY ANGIOGRAM N/A 10/31/2014   Procedure: LOWER EXTREMITY ANGIOGRAM;  Surgeon: [name], MD;  Location: [facility] CATH LAB;  Service: Cardiovascular;  Laterality: N/A;   MALONEY DILATION N/A 10/11/2019   Procedure: Venia Minks DILATION;  Surgeon: [name], MD;  [facility];  Service: Endoscopy;  Laterality: N/A;   MULTIPLE TOOTH EXTRACTIONS     PV angiogram  02/18/2014   tibial vessel diseas bil.   SPINE SURGERY     tendon achillies lengthing and sesamoid         Family History  Problem Relation Age of Onset   Heart disease Mother    Hypertension Mother    Heart attack Mother    Hypertension Father  Diabetes Father    Diabetes Son    Heart disease Son    Hypertension Son    Colon cancer Neg Hx     Social History   Tobacco Use   Smoking status: Former    Types: Pipe    Quit date: 07/08/1977    Years since quitting: 43.8   Smokeless tobacco: Never  Vaping Use   Vaping Use: Never used  Substance Use Topics   Alcohol use: No    Alcohol/week: 0.0 standard drinks   Drug use: No    Home Medications Prior to Admission medications   Medication Sig Start Date End Date Taking? Authorizing Provider  aspirin EC 81 MG EC tablet Take 1 tablet (81 mg total) by mouth daily. 05/21/16   Barrett, Evelene Croon, PA-C  b complex vitamins tablet Take 1 tablet by mouth daily.    [provider]  carvedilol (COREG) 3.125 MG tablet Take 0.5 tablets (1.5625 mg total) by mouth 2 (two) times daily with a meal. 03/18/21   Noreene Larsson, NP  Cholecalciferol (VITAMIN D3) 5000 units TABS Take 5,000 Units by mouth 2 (two)  times daily.     [provider]  clopidogrel (PLAVIX) 75 MG tablet TAKE ONE (1) TABLET BY MOUTH EVERY DAY 03/16/21   Fayrene Helper, MD  Continuous Blood Gluc Receiver (FREESTYLE LIBRE 14 DAY READER) DEVI  12/07/19   [provider]  Continuous Blood Gluc Sensor (FREESTYLE LIBRE 14 DAY SENSOR) MISC Inject 1 each into the skin every 14 (fourteen) days. 01/22/21   Noreene Larsson, NP  Cyanocobalamin 1500 MCG TBDP Take 1,500 mcg by mouth daily.     [provider]  DULoxetine (CYMBALTA) 60 MG capsule Take 1 capsule (60 mg total) by mouth daily. 09/09/20   Fayrene Helper, MD  empagliflozin (JARDIANCE) 25 MG TABS tablet Take 1 tablet (25 mg total) by mouth daily before breakfast. 09/09/20   Fayrene Helper, MD  furosemide (LASIX) 40 MG tablet Take 1 tablet (40 mg total) by mouth daily. 02/19/21   Johnson, Clanford L, MD  Gabapentin Enacarbil (HORIZANT) 600 MG TBCR Horizant ER 600 mg tablet,extended release  Take 1 tablet every day by oral route at dinner.    [provider]  levofloxacin (LEVAQUIN) 500 MG tablet Take 1 tablet (500 mg total) by mouth daily. 05/07/21   Mesner, Corene Cornea, MD  levothyroxine (SYNTHROID) 88 MCG tablet Take 1 tablet (88 mcg total) by mouth daily. 01/27/21   Noreene Larsson, NP  linagliptin (TRADJENTA) 5 MG TABS tablet Take 1 tablet (5 mg total) by mouth daily. 02/17/21   Murlean Iba, MD  Multiple Vitamin (MULTIVITAMIN WITH MINERALS) TABS tablet Take 1 tablet by mouth daily.    [provider]  nitroGLYCERIN (NITROSTAT) 0.4 MG SL tablet Place 1 tablet (0.4 mg total) under the tongue every 5 (five) minutes as needed for chest pain. 05/22/16   Barrett, Evelene Croon, PA-C  pantoprazole (PROTONIX) 40 MG tablet TAKE ONE (1) TABLET BY MOUTH EVERY DAY 04/10/20   Erenest Rasher, PA-C  potassium chloride (KLOR-CON) 10 MEQ tablet TAKE ONE TABLET (10MEQ TOTAL) BY MOUTH DAILY 05/11/21   [name], MD  pramipexole (MIRAPEX) 0.25 MG  tablet pramipexole 0.25 mg tablet    [provider]  rOPINIRole (REQUIP) 0.25 MG tablet Take by mouth. 05/13/21   [provider]  rosuvastatin (CRESTOR) 20 MG tablet Take 1 tablet (20 mg total) by mouth daily. 09/09/20  Fayrene Helper, MD  UNABLE TO FIND Home health supplies for CPAP 09/30/20   Perlie Mayo, NP  UNABLE TO FIND [place] sock aid to help with support hose. Size XL.  Dx:I73.9, G25.81, R60.9 01/14/21   Noreene Larsson, NP    Allergies    Codeine and Tramadol  Review of Systems   Review of Systems  Constitutional:  Negative for chills and fever.  HENT:  Negative for ear pain and sore throat.   Eyes:  Negative for visual disturbance.  Respiratory:  Positive for shortness of breath. Negative for cough.   Cardiovascular:  Negative for chest pain and palpitations.  Gastrointestinal:  Positive for nausea. Negative for abdominal pain, constipation, diarrhea and vomiting.  Genitourinary:  Negative for dysuria and hematuria.  Musculoskeletal:  Negative for back pain.  Skin:  Negative for color change and rash.  Neurological:  Positive for light-headedness. Negative for syncope and headaches.  All other systems reviewed and are negative.  Physical Exam Updated Vital Signs BP (!) 151/81   Pulse 74   Temp 98 F (36.7 C) (Oral)   Resp 17   Ht 6' (1.829 m)   Wt 86.9 kg   SpO2 98%   BMI 25.99 kg/m   Physical Exam Vitals and nursing note reviewed.  Constitutional:      Appearance: He is well-developed.  HENT:     Head: Normocephalic and atraumatic.  Eyes:     Conjunctiva/sclera: Conjunctivae normal.  Cardiovascular:     Rate and Rhythm: Normal rate and regular rhythm.     Heart sounds: Normal heart sounds. No murmur heard. Pulmonary:     Effort: Pulmonary effort is normal. No respiratory distress.     Breath sounds: Normal breath sounds. No wheezing, rhonchi or rales.  Abdominal:     General: Bowel sounds are normal.     Palpations: Abdomen is  soft.     Tenderness: There is no abdominal tenderness.  Musculoskeletal:     Cervical back: Neck supple.  Skin:    General: Skin is warm and dry.  Neurological:     Mental Status: He is alert.    ED Results / Procedures / Treatments   Labs (all labs ordered are listed, but only abnormal results are displayed) Labs Reviewed  COMPREHENSIVE METABOLIC PANEL - Abnormal; Notable for the following components:      Result Value   Sodium 134 (*)    Potassium 3.2 (*)    Chloride 97 (*)    Glucose, Bld 229 (*)    Calcium 7.8 (*)    AST 13 (*)    All other components within normal limits  CBC WITH DIFFERENTIAL/PLATELET - Abnormal; Notable for the following components:   WBC 3.5 (*)    RBC 3.10 (*)    Hemoglobin 10.6 (*)    HCT 31.7 (*)    MCV 102.3 (*)    MCH 34.2 (*)    All other components within normal limits  BRAIN NATRIURETIC PEPTIDE - Abnormal; Notable for the following components:   B Natriuretic Peptide 174.0 (*)    All other components within normal limits  TROPONIN I (HIGH SENSITIVITY)  TROPONIN I (HIGH SENSITIVITY)    EKG EKG Interpretation  Date/Time:  Saturday May 16 2021 16:59:29 EDT Ventricular Rate:  81 PR Interval:  200 QRS Duration: 92 QT Interval:  404 QTC Calculation: 469 R Axis:   61 Text Interpretation: Normal sinus rhythm Nonspecific T wave abnormality Prolonged QT Abnormal ECG Since last  tracing QT duration has improved Confirmed by Calvert Cantor [phone]) on 05/16/2021 5:12:56 PM  Radiology DG Chest 2 View  Result Date: 05/16/2021 CLINICAL DATA:  Near-syncope. EXAM: CHEST - 2 VIEW COMPARISON:  Chest x-ray dated May 06, 2021. FINDINGS: The heart size and mediastinal contours are within normal limits. Both lungs are clear. The visualized skeletal structures are unremarkable. IMPRESSION: No active cardiopulmonary disease. Electronically Signed   By: Titus Dubin M.D.   On: 05/16/2021 17:43    Procedures Procedures   Medications Ordered in  ED Medications  sodium chloride 0.9 % bolus 500 mL (0 mLs Intravenous Stopped 05/16/21 1928)  potassium chloride SA (KLOR-CON) CR tablet 40 mEq (40 mEq Oral Given 05/16/21 1927)    ED Course  I have reviewed the triage vital signs and the nursing notes.  Pertinent labs & imaging results that were available during my care of the patient were reviewed by me and considered in my medical decision making (see chart for details).    MDM Rules/Calculators/A&P                          75 y/o m presenting for eval of near syncope  Reviewed/interpreted labs CBC -is without leukocytosis or anemia CMP -showed a mild hypokalemia, elevated blood glucose but otherwise was generally reassured Trop -negative x2 BNP -marginally elevated  EKG - Normal sinus rhythm Nonspecific T wave abnormality Prolonged QT Abnormal ECG Since last tracing QT duration has improved   Reviewed/interpreted imaging CXR - No active cardiopulmonary disease.   Patient presenting for evaluation of a near syncopal episode.  He felt improved when sitting down but felt lightheaded with standing.  His blood pressure and vital signs have remained stable here.  He is mildly hypokalemic and looks little dehydrated on his labs but the remainder of his work-up is reassuring.  He received a small fluid bolus in the ED and on reassessment he stated he felt much improved.  He ambulated about the department and had no further episodes of near syncope.  I suspect that this is likely volume related.  Have lower suspicion for neurocardiogenic cause of symptoms or other emergent etiology that would warrant further work-up or admission to the hospital.  Feel he is appropriate for DC home with close follow-up with his PCP and strict return precautions.  He voiced understanding of the plan and reasons to return.  All questions answered.  Patient stable for discharge.    Patient seen in conjunction with Dr. Karle Starch who personally evaluated the patient  and is in agreement with the plan.   Final Clinical Impression(s) / ED Diagnoses Final diagnoses:  Near syncope    Rx / DC Orders ED Discharge Orders     None        Bishop Dublin 05/16/21 2119    Truddie Hidden, MD 05/16/21 2258

## 2021-05-16 NOTE — Discharge Instructions (Addendum)
Stay well hydrated. Your lab work was reassuring except that your potassium was slightly low. Please have your labs rechecked next week    Please follow up with your primary care provider within 5-7 days for re-evaluation of your symptoms. If you do not have a primary care provider, information for a healthcare clinic has been provided for you to make arrangements for follow up care. Please return to the emergency department for any new or worsening symptoms.

## 2021-05-18 ENCOUNTER — Ambulatory Visit (INDEPENDENT_AMBULATORY_CARE_PROVIDER_SITE_OTHER): Payer: HMO | Admitting: Nurse Practitioner

## 2021-05-18 ENCOUNTER — Ambulatory Visit (HOSPITAL_COMMUNITY): Payer: HMO

## 2021-05-18 ENCOUNTER — Encounter: Payer: Self-pay | Admitting: Nurse Practitioner

## 2021-05-18 ENCOUNTER — Other Ambulatory Visit: Payer: Self-pay

## 2021-05-18 ENCOUNTER — Telehealth (HOSPITAL_COMMUNITY): Payer: Self-pay | Admitting: Physical Therapy

## 2021-05-18 ENCOUNTER — Ambulatory Visit (HOSPITAL_COMMUNITY): Payer: HMO | Admitting: Physical Therapy

## 2021-05-18 VITALS — BP 94/57 | HR 88 | Temp 98.2°F | Ht 72.0 in | Wt 191.0 lb

## 2021-05-18 DIAGNOSIS — J189 Pneumonia, unspecified organism: Secondary | ICD-10-CM | POA: Diagnosis not present

## 2021-05-18 DIAGNOSIS — E876 Hypokalemia: Secondary | ICD-10-CM | POA: Diagnosis not present

## 2021-05-18 NOTE — Telephone Encounter (Signed)
Pt did not show for OT or PT appointment today.  Called and left voicemail regarding the missed appointments and reminded of upcoming appointment on Thursday.  Number to clinic given and requested to call and cancel if he could not make his next appointment.    Teena Irani, PTA/CLT [phone]

## 2021-05-18 NOTE — Assessment & Plan Note (Signed)
-  took K supplement and IVF bolus in ED -recheck potassium

## 2021-05-18 NOTE — Patient Instructions (Signed)
Please have your metabolic panel drawn when you have testosterone drawn tomorrow.

## 2021-05-18 NOTE — Assessment & Plan Note (Signed)
-  still has rhonchi to right anterior lund fields on auscultation -f/u with pulmonary as scheduled

## 2021-05-18 NOTE — Progress Notes (Signed)
  Subjective:  Patient ID: [name], male    DOB: 1946-03-11,  MRN: [id number]  Chief Complaint  Patient presents with   Diabetic Ulcer    New wound right foot, A1C  9.6    75 y.o. male presents with the above complaint. History confirmed with patient.  There is a recurrence of the same ulceration he has had previously that has not fully healed.  His white blood cell count recently increased and they are wondering if it could be from infection  Objective:  Physical Exam: warm, good capillary refill, no trophic changes or ulcerative lesions, normal DP and PT pulses, and loss of protective sensation he has a full-thickness wound measuring approximately 0.4 cm x 1.0 cm from 0.2 cm with exposed subcutaneous tissue no exposed bone tendon or joint does not probe deep no sinus tract     Assessment:   1. Diabetic ulcer of right midfoot associated with diabetes mellitus due to underlying condition, with fat layer exposed ([place])   2. Type 2 diabetes mellitus with other circulatory complication, without long-term current use of insulin ([place])   3. Diabetic peripheral neuropathy associated with type 2 diabetes mellitus ([place])      Plan:  Patient was evaluated and treated and all questions answered.  Debrided the overlying hyperkeratosis and evaluated the wound it does not appear to be acutely infected.  It is possible he has chronic deeper infection.  I doubt that this is the source for the elevation in his white count.  I think it be prudent to order an MRI which she has not had to rule out osteomyelitis, his PCP should continue to work-up other sources of leukocytosis.  Also referring him back for wound care in [place] at [facility].  Return to see me as needed or in 4 weeks if he has not establish care for wound care there to review MRI and findings  Return in about 4 weeks (around 06/11/2021) for wound care, after MRI to review.

## 2021-05-18 NOTE — Progress Notes (Signed)
Established Patient Office Visit  Subjective:  Patient ID: [name], male    DOB: 01-09-1946  Age: 75 y.o. MRN: [id number]  CC:  Chief Complaint  Patient presents with   Pneumonia    Follow up     HPI [name] presents for ED follow-up for near syncope. On 8/27, he felt like he was going to pass out when going from sitting to standing. He got a bolus of fluids and felt better.  Past Medical History:  Diagnosis Date   Aftercare following surgery of the circulatory system, NEC 12/04/2013   Angina decubitus (HCC) 05/19/2016   Arrhythmia 05/20/2016   Arthritis    ARTHRITIS, RIGHT FOOT 06/26/2008   Qualifier: Diagnosis of  By: [name] MD, [name]     Cardiomyopathy- EF NL 01/2013, now 30-35% echo 03/14/2012   Carotid artery occlusion    left s/p CEA   Cellulitis of left foot 05/07/2018   CHF (congestive heart failure) (HCC)    Critical lower limb ischemia (HCC) 02/28/2014   Critical limb ischemia    Diabetes mellitus    Elevated troponin 04/11/2016   GERD (gastroesophageal reflux disease)    History of stroke June 2013 03/14/2012   HOH (hard of hearing)    Hypertension    Hypothyroidism    Nonhealing skin ulcer (HCC) 10/28/2014   Obstructive sleep apnea    Orthostatic hypotension    Osteomyelitis (HCC)    left great toe   Pain in the chest 05/17/2016   Peripheral arterial disease (HCC), s/p PTA x 2 RLE    nonhealing ulcers bilaterally on each great toe   Pneumonia    PONV (postoperative nausea and vomiting)    Pulmonary nodule 05/17/2016   Restless leg syndrome    Shortness of breath    Stroke [facility]) March 08, 2012   Toe osteomyelitis, left [facility])    Ulcer of great toe, left, with necrosis of bone (HCC)    Wears dentures     Past Surgical History:  Procedure Laterality Date   AMPUTATION TOE Left 07/25/2018   Procedure: AMPUTATION TOE INTERPHALANGEAL HALLUX LEFT;  Surgeon: Park Liter, DPM;  Location: [facility] OR;  Service: Podiatry;  Laterality: Left;    ANGIOPLASTY  02/28/14   diamond back orbital rotational atherectomy of Rt. tibial   BACK SURGERY     BONE BIOPSY Left 07/25/2018   Procedure: SUPERFICIAL BONE BIOPSY;  Surgeon: Park Liter, DPM;  Location: [facility] OR;  Service: Podiatry;  Laterality: Left;   CARDIAC CATHETERIZATION N/A 05/19/2016   Procedure: Right/Left Heart Cath and Coronary Angiography;  Surgeon: [name], MD;  Location: [facility] INVASIVE CV LAB;  Service: Cardiovascular;  Laterality: N/A;   CERVICAL FUSION     ENDARTERECTOMY Left 11/29/2013   Procedure: ENDARTERECTOMY CAROTID;  Surgeon: [name], MD;  Location: [facility] OR;  Service: Vascular;  Laterality: Left;   ESOPHAGOGASTRODUODENOSCOPY  02/2010   Dr. [name]: patient presented with food impaction, schatzki ring with superimposed component of stricture with erosive reflux esophagitis, s/p disimpaction but dilation planned at later date    ESOPHAGOGASTRODUODENOSCOPY (EGD) WITH PROPOFOL N/A 08/13/2019   Dr. [name]: Erosive reflux esophagitis with mild stricture and incidental Mallory-Weiss tear which precluded esophageal dilation.  Medium sized hiatal hernia.   ESOPHAGOGASTRODUODENOSCOPY (EGD) WITH PROPOFOL N/A 10/11/2019   Dr. [name]: Esophageal stenosis status post dilation, moderate hiatal hernia   EYE SURGERY     FLEXIBLE SIGMOIDOSCOPY N/A 08/13/2019   Procedure: FLEXIBLE SIGMOIDOSCOPY;  Surgeon: [name],  [name], MD;  [facility] ENDO SUITE;  Service: Endoscopy;  Laterality: N/A;  colonoscopy aboerted due to formed stool and poor prep   FOOT SURGERY     KNEE ARTHROSCOPY WITH MEDIAL MENISECTOMY Left 05/06/2020   Procedure: KNEE ARTHROSCOPY WITH MEDIAL MENISCECTOMY AND LATERAL MENISCECTOMY;  Surgeon: [name], MD;  Location: [facility] ORS;  Service: Orthopedics;  Laterality: Left;   Lower ext duplex doppler  03/14/14   Rt ABI 1.2   LOWER EXTREMITY ANGIOGRAM Bilateral 02/18/2014   Procedure: LOWER EXTREMITY ANGIOGRAM;  Surgeon: [name], MD;  Location: [facility] CATH LAB;   Service: Cardiovascular;  Laterality: Bilateral;   LOWER EXTREMITY ANGIOGRAM N/A 10/31/2014   Procedure: LOWER EXTREMITY ANGIOGRAM;  Surgeon: [name], MD;  Location: [facility] CATH LAB;  Service: Cardiovascular;  Laterality: N/A;   MALONEY DILATION N/A 10/11/2019   Procedure: Venia Minks DILATION;  Surgeon: [name], MD;  [facility] ENDO SUITE;  Service: Endoscopy;  Laterality: N/A;   MULTIPLE TOOTH EXTRACTIONS     PV angiogram  02/18/2014   tibial vessel diseas bil.   SPINE SURGERY     tendon achillies lengthing and sesamoid      Family History  Problem Relation Age of Onset   Heart disease Mother    Hypertension Mother    Heart attack Mother    Hypertension Father    Diabetes Father    Diabetes Son    Heart disease Son    Hypertension Son    Colon cancer Neg Hx     Social History   Socioeconomic History   Marital status: Married    Spouse name: [name]    Number of children: 4   Years of education: college   Highest education level: Not on file  Occupational History   Occupation: Firefighter   Tobacco Use   Smoking status: Former    Types: Pipe    Quit date: 07/08/1977    Years since quitting: 43.8   Smokeless tobacco: Never  Vaping Use   Vaping Use: Never used  Substance and Sexual Activity   Alcohol use: No    Alcohol/week: 0.0 standard drinks   Drug use: No   Sexual activity: Not on file  Other Topics Concern   Not on file  Social History Narrative   Retired from [employer] social research officer -[employer]      Lives with [name]    Cat: Jazz      Enjoy: sleeping a lot       Diet: eats all food groups -chicken, steak-chopped, mostly veggie   Caffeine: coffee and soda "a lot"   Water: 3-4 cups daily      Wears seat belt   Does not use phone while driving    Oceanographer at home    weapons at home        Social Determinants of Health   Financial Resource Strain: Not on file  Food Insecurity: No Food Insecurity   Worried About Charity fundraiser in the  Last Year: Never true   Arboriculturist in the Last Year: Never true  Transportation Needs: No Transportation Needs   Lack of Transportation (Medical): No   Lack of Transportation (Non-Medical): No  Physical Activity: Not on file  Stress: Not on file  Social Connections: Moderately Isolated   Frequency of Communication with Friends and Family: Three times a week   Frequency of Social Gatherings with Friends and Family: Three times a week   Attends  Religious Services: Never   Active Member of Clubs or Organizations: No   Attends Archivist Meetings: Never   Marital Status: Married  Human resources officer Violence: Not on file    Outpatient Medications Prior to Visit  Medication Sig Dispense Refill   aspirin EC 81 MG EC tablet Take 1 tablet (81 mg total) by mouth daily.     b complex vitamins tablet Take 1 tablet by mouth daily.     carvedilol (COREG) 3.125 MG tablet Take 0.5 tablets (1.5625 mg total) by mouth 2 (two) times daily with a meal. 180 tablet 3   Cholecalciferol (VITAMIN D3) 5000 units TABS Take 5,000 Units by mouth 2 (two) times daily.      clopidogrel (PLAVIX) 75 MG tablet TAKE ONE (1) TABLET BY MOUTH EVERY DAY 90 tablet 0   Continuous Blood Gluc Receiver (FREESTYLE LIBRE 14 DAY READER) DEVI      Continuous Blood Gluc Sensor (FREESTYLE LIBRE 14 DAY SENSOR) MISC Inject 1 each into the skin every 14 (fourteen) days. 2 each 5   Cyanocobalamin 1500 MCG TBDP Take 1,500 mcg by mouth daily.      DULoxetine (CYMBALTA) 60 MG capsule Take 1 capsule (60 mg total) by mouth daily. 90 capsule 1   empagliflozin (JARDIANCE) 25 MG TABS tablet Take 1 tablet (25 mg total) by mouth daily before breakfast. 90 tablet 1   furosemide (LASIX) 40 MG tablet Take 1 tablet (40 mg total) by mouth daily.     Gabapentin Enacarbil (HORIZANT) 600 MG TBCR Horizant ER 600 mg tablet,extended release  Take 1 tablet every day by oral route at dinner.     levofloxacin (LEVAQUIN) 500 MG tablet Take 1 tablet  (500 mg total) by mouth daily. 7 tablet 0   levothyroxine (SYNTHROID) 88 MCG tablet Take 1 tablet (88 mcg total) by mouth daily. 90 tablet 3   linagliptin (TRADJENTA) 5 MG TABS tablet Take 1 tablet (5 mg total) by mouth daily. 30 tablet    Multiple Vitamin (MULTIVITAMIN WITH MINERALS) TABS tablet Take 1 tablet by mouth daily.     nitroGLYCERIN (NITROSTAT) 0.4 MG SL tablet Place 1 tablet (0.4 mg total) under the tongue every 5 (five) minutes as needed for chest pain. 25 tablet 3   pantoprazole (PROTONIX) 40 MG tablet TAKE ONE (1) TABLET BY MOUTH EVERY DAY 30 tablet 11   potassium chloride (KLOR-CON) 10 MEQ tablet TAKE ONE TABLET (10MEQ TOTAL) BY MOUTH DAILY 90 tablet 3   pramipexole (MIRAPEX) 0.25 MG tablet pramipexole 0.25 mg tablet     rOPINIRole (REQUIP) 0.25 MG tablet Take by mouth.     rosuvastatin (CRESTOR) 20 MG tablet Take 1 tablet (20 mg total) by mouth daily. 90 tablet 1   UNABLE TO FIND Home health supplies for CPAP 1 Product 0   UNABLE TO FIND Butler sock aid to help with support hose. Size XL.  Dx:I73.9, G25.81, R60.9 1 each 0   No facility-administered medications prior to visit.    Allergies  Allergen Reactions   Codeine Nausea And Vomiting   Tramadol Nausea Only    ROS Review of Systems  Constitutional: Negative.        Appetite increasing  Respiratory: Negative.    Cardiovascular: Negative.   Musculoskeletal:        Weakness- improving  Psychiatric/Behavioral: Negative.       Objective:    Physical Exam Constitutional:      Appearance: Normal appearance.  Cardiovascular:     Rate and  Rhythm: Normal rate and regular rhythm.     Pulses: Normal pulses.     Heart sounds: Normal heart sounds.  Pulmonary:     Effort: Pulmonary effort is normal.     Breath sounds: Rhonchi present.     Comments: Right lung fields; heard loudest in anterior chest Neurological:     Mental Status: He is alert.  Psychiatric:        Mood and Affect: Mood normal.        Behavior:  Behavior normal.        Thought Content: Thought content normal.        Judgment: Judgment normal.    BP (!) 94/57 (BP Location: Right Arm, Patient Position: Sitting, Cuff Size: Large)   Pulse 88   Temp 98.2 F (36.8 C) (Oral)   Ht 6' (1.829 m)   Wt 191 lb (86.6 kg)   SpO2 95%   BMI 25.90 kg/m  Wt Readings from Last 3 Encounters:  05/18/21 191 lb (86.6 kg)  05/16/21 191 lb 9.6 oz (86.9 kg)  05/12/21 191 lb (86.6 kg)     Health Maintenance Due  Topic Date Due   OPHTHALMOLOGY EXAM  02/07/2021   INFLUENZA VACCINE  04/20/2021    There are no preventive care reminders to display for this patient.  Lab Results  Component Value Date   TSH 4.637 (H) 05/06/2021   Lab Results  Component Value Date   WBC 3.5 (L) 05/16/2021   HGB 10.6 (L) 05/16/2021   HCT 31.7 (L) 05/16/2021   MCV 102.3 (H) 05/16/2021   PLT 215 05/16/2021   Lab Results  Component Value Date   NA 134 (L) 05/16/2021   K 3.2 (L) 05/16/2021   CO2 31 05/16/2021   GLUCOSE 229 (H) 05/16/2021   BUN 15 05/16/2021   CREATININE 0.79 05/16/2021   BILITOT 0.6 05/16/2021   ALKPHOS 52 05/16/2021   AST 13 (L) 05/16/2021   ALT 10 05/16/2021   PROT 7.0 05/16/2021   ALBUMIN 3.7 05/16/2021   CALCIUM 7.8 (L) 05/16/2021   ANIONGAP 6 05/16/2021   EGFR 93 05/12/2021   Lab Results  Component Value Date   CHOL 149 01/26/2021   Lab Results  Component Value Date   HDL 46 01/26/2021   Lab Results  Component Value Date   LDLCALC 88 01/26/2021   Lab Results  Component Value Date   TRIG 78 01/26/2021   Lab Results  Component Value Date   CHOLHDL 2.8 09/25/2020   Lab Results  Component Value Date   HGBA1C 9.6 (H) 01/26/2021      Assessment & Plan:   Problem List Items Addressed This Visit       Respiratory   PNA (pneumonia)    -still has rhonchi to right anterior lund fields on auscultation -f/u with pulmonary as scheduled        Other   Hypokalemia - Primary    -took K supplement and IVF bolus  in ED -recheck potassium      Relevant Orders   Basic metabolic panel    No orders of the defined types were placed in this encounter.   Follow-up: Return if symptoms worsen or fail to improve.    Noreene Larsson, NP

## 2021-05-19 ENCOUNTER — Ambulatory Visit: Payer: HMO | Admitting: *Deleted

## 2021-05-19 ENCOUNTER — Encounter (HOSPITAL_COMMUNITY): Payer: HMO | Admitting: Physical Therapy

## 2021-05-19 DIAGNOSIS — R269 Unspecified abnormalities of gait and mobility: Secondary | ICD-10-CM

## 2021-05-19 DIAGNOSIS — J189 Pneumonia, unspecified organism: Secondary | ICD-10-CM

## 2021-05-19 DIAGNOSIS — E08621 Diabetes mellitus due to underlying condition with foot ulcer: Secondary | ICD-10-CM

## 2021-05-19 DIAGNOSIS — R29898 Other symptoms and signs involving the musculoskeletal system: Secondary | ICD-10-CM

## 2021-05-19 DIAGNOSIS — E876 Hypokalemia: Secondary | ICD-10-CM | POA: Diagnosis not present

## 2021-05-20 NOTE — Progress Notes (Signed)
Total testosterone level is normal. Electrolytes are great.

## 2021-05-20 NOTE — Chronic Care Management (AMB) (Signed)
  Chronic Care Management   Note  05/20/2021 Name: [name] MRN: [id number] DOB: 1945/10/26  CSW received a referral for possible placement and was able to make contact with pt and his wife on 05/19/21. Per wife, she is able to care for him. Wife states; "have been doing it for 18 years".  CSW inquired about interest in pursuing placement in a facility and both pt and wife decline.   Pt is going to outpatient PT/OT and apparently missed his 8/29 visits because he thought it was on 8/30- and actually arrived on the wrong day.  CSW discussed CSW role and the CCM collaboration with PCP office. Pt and wife deny and decline need for follow up.  Follow up plan: The patient has been provided with contact information for the care management team and has been advised to call with any health related questions or concerns.  No further follow up required: please re-consult if interest or needs change.  Eduard Clos MSW, LCSW Licensed Music therapist Primary Care [phone]

## 2021-05-21 ENCOUNTER — Other Ambulatory Visit: Payer: Self-pay

## 2021-05-21 ENCOUNTER — Telehealth: Payer: Self-pay | Admitting: *Deleted

## 2021-05-21 ENCOUNTER — Ambulatory Visit (HOSPITAL_COMMUNITY): Payer: HMO | Attending: Family Medicine | Admitting: Physical Therapy

## 2021-05-21 ENCOUNTER — Ambulatory Visit (HOSPITAL_COMMUNITY): Payer: HMO | Admitting: Occupational Therapy

## 2021-05-21 ENCOUNTER — Encounter (HOSPITAL_COMMUNITY): Payer: Self-pay | Admitting: Occupational Therapy

## 2021-05-21 DIAGNOSIS — M25512 Pain in left shoulder: Secondary | ICD-10-CM | POA: Insufficient documentation

## 2021-05-21 DIAGNOSIS — R29898 Other symptoms and signs involving the musculoskeletal system: Secondary | ICD-10-CM | POA: Insufficient documentation

## 2021-05-21 DIAGNOSIS — R262 Difficulty in walking, not elsewhere classified: Secondary | ICD-10-CM | POA: Diagnosis not present

## 2021-05-21 DIAGNOSIS — M6281 Muscle weakness (generalized): Secondary | ICD-10-CM | POA: Insufficient documentation

## 2021-05-21 DIAGNOSIS — E08621 Diabetes mellitus due to underlying condition with foot ulcer: Secondary | ICD-10-CM | POA: Diagnosis not present

## 2021-05-21 DIAGNOSIS — R2681 Unsteadiness on feet: Secondary | ICD-10-CM | POA: Diagnosis not present

## 2021-05-21 DIAGNOSIS — G8929 Other chronic pain: Secondary | ICD-10-CM | POA: Insufficient documentation

## 2021-05-21 DIAGNOSIS — L97411 Non-pressure chronic ulcer of right heel and midfoot limited to breakdown of skin: Secondary | ICD-10-CM | POA: Diagnosis not present

## 2021-05-21 DIAGNOSIS — M25612 Stiffness of left shoulder, not elsewhere classified: Secondary | ICD-10-CM | POA: Diagnosis not present

## 2021-05-21 NOTE — Therapy (Signed)
[facility] [address] [place], [place], [address] Phone: [phone]   Fax:  [phone]  Occupational Therapy Treatment  Patient Details  Name: [name] MRN: [id number] Date of Birth: [birth year] Referring Provider (OT): Demetrius Revel, NP   Encounter Date: 05/21/2021   OT End of Session - 05/21/21 1029     Visit Number 4    Number of Visits 8    Date for OT Re-Evaluation 05/30/21    Authorization Type [employer]; $30 copay    Authorization Time Period no visit limit    Progress Note Due on Visit 10    OT Start Time 0947    OT Stop Time 1026    OT Time Calculation (min) 39 min    Activity Tolerance Patient tolerated treatment well    Behavior During Therapy [facility] for tasks assessed/performed             Past Medical History:  Diagnosis Date   Aftercare following surgery of the circulatory system, NEC 12/04/2013   Angina decubitus ([place]) 05/19/2016   Arrhythmia 05/20/2016   Arthritis    ARTHRITIS, RIGHT FOOT 06/26/2008   Qualifier: Diagnosis of  By: Aline Brochure MD, [name]     Cardiomyopathy- EF NL 01/2013, now 30-35% echo 03/14/2012   Carotid artery occlusion    left s/p CEA   Cellulitis of left foot 05/07/2018   CHF (congestive heart failure) ([place])    Critical lower limb ischemia ([place]) 02/28/2014   Critical limb ischemia    Diabetes mellitus    Elevated troponin 04/11/2016   GERD (gastroesophageal reflux disease)    History of stroke June 2013 03/14/2012   HOH (hard of hearing)    Hypertension    Hypothyroidism    Nonhealing skin ulcer ([place]) 10/28/2014   Obstructive sleep apnea    Orthostatic hypotension    Osteomyelitis ([place])    left great toe   Pain in the chest 05/17/2016   Peripheral arterial disease ([place]), s/p PTA x 2 RLE    nonhealing ulcers bilaterally on each great toe   Pneumonia    PONV (postoperative nausea and vomiting)    Pulmonary nodule 05/17/2016   Restless leg syndrome    Shortness of breath     Stroke [facility]) March 08, 2012   Toe osteomyelitis, left [facility])    Ulcer of great toe, left, with necrosis of bone ([place])    Wears dentures     Past Surgical History:  Procedure Laterality Date   AMPUTATION TOE Left 07/25/2018   Procedure: AMPUTATION TOE INTERPHALANGEAL HALLUX LEFT;  Surgeon: Evelina Bucy, DPM;  Location: [place];  Service: Podiatry;  Laterality: Left;   ANGIOPLASTY  02/28/14   diamond back orbital rotational atherectomy of Rt. tibial   BACK SURGERY     BONE BIOPSY Left 07/25/2018   Procedure: SUPERFICIAL BONE BIOPSY;  Surgeon: Evelina Bucy, DPM;  Location: [place];  Service: Podiatry;  Laterality: Left;   CARDIAC CATHETERIZATION N/A 05/19/2016   Procedure: Right/Left Heart Cath and Coronary Angiography;  Surgeon: Belva Crome, MD;  Location: [place] CV LAB;  Service: Cardiovascular;  Laterality: N/A;   CERVICAL FUSION     ENDARTERECTOMY Left 11/29/2013   Procedure: ENDARTERECTOMY CAROTID;  Surgeon: Serafina Mitchell, MD;  Location: [place];  Service: Vascular;  Laterality: Left;   ESOPHAGOGASTRODUODENOSCOPY  02/2010   Dr. Gala Romney: patient presented with food impaction, schatzki ring with superimposed component of stricture with erosive reflux esophagitis, s/p disimpaction  but dilation planned at later date    ESOPHAGOGASTRODUODENOSCOPY (EGD) WITH PROPOFOL N/A 08/13/2019   Dr. Gala Romney: Erosive reflux esophagitis with mild stricture and incidental Mallory-Weiss tear which precluded esophageal dilation.  Medium sized hiatal hernia.   ESOPHAGOGASTRODUODENOSCOPY (EGD) WITH PROPOFOL N/A 10/11/2019   Dr. Gala Romney: Esophageal stenosis status post dilation, moderate hiatal hernia   EYE SURGERY     FLEXIBLE SIGMOIDOSCOPY N/A 08/13/2019   Procedure: FLEXIBLE SIGMOIDOSCOPY;  Surgeon: [name], MD;  [facility];  Service: Endoscopy;  Laterality: N/A;  colonoscopy aboerted due to formed stool and poor prep   FOOT SURGERY     KNEE ARTHROSCOPY WITH MEDIAL MENISECTOMY Left  05/06/2020   Procedure: KNEE ARTHROSCOPY WITH MEDIAL MENISCECTOMY AND LATERAL MENISCECTOMY;  Surgeon: [name], MD;  Location[facility];  Service: Orthopedics;  Laterality: Left;   Lower ext duplex doppler  03/14/14   Rt ABI 1.2   LOWER EXTREMITY ANGIOGRAM Bilateral 02/18/2014   Procedure: LOWER EXTREMITY ANGIOGRAM;  Surgeon: [name], MD;  Location: [facility] CATH LAB;  Service: Cardiovascular;  Laterality: Bilateral;   LOWER EXTREMITY ANGIOGRAM N/A 10/31/2014   Procedure: LOWER EXTREMITY ANGIOGRAM;  Surgeon: [name], MD;  Location: [facility] CATH LAB;  Service: Cardiovascular;  Laterality: N/A;   MALONEY DILATION N/A 10/11/2019   Procedure: Venia Minks DILATION;  Surgeon: [name], MD;  [facility];  Service: Endoscopy;  Laterality: N/A;   MULTIPLE TOOTH EXTRACTIONS     PV angiogram  02/18/2014   tibial vessel diseas bil.   SPINE SURGERY     tendon achillies lengthing and sesamoid      There were no vitals filed for this visit.   Subjective Assessment - 05/21/21 0947     Subjective  S: I slept on the shoulder last night.    Currently in Pain? Yes    Pain Score 3     Pain Location Shoulder    Pain Orientation Right    Pain Descriptors / Indicators Sore    Pain Type Chronic pain    Pain Radiating Towards N/A    Pain Onset Yesterday    Pain Frequency Intermittent    Aggravating Factors  sleeping on it    Pain Relieving Factors moving and stretching    Effect of Pain on Daily Activities mod effect on ADLs    Multiple Pain Sites No                [facility] OT Assessment - 05/21/21 0946       Assessment   Medical Diagnosis Chronic left shoulder pain      Precautions   Precautions Fall                      OT Treatments/Exercises (OP) - 05/21/21 [id number]       Exercises   Exercises Shoulder      Shoulder Exercises: Supine   Protraction PROM;5 reps;AROM;10 reps    Horizontal ABduction PROM;5 reps;AROM;10 reps    External Rotation PROM;5  reps;AROM;10 reps    Internal Rotation PROM;5 reps;AROM;10 reps    Flexion PROM;5 reps;AROM;10 reps    ABduction PROM;5 reps;AROM;10 reps      Shoulder Exercises: Seated   Extension Theraband;10 reps    Theraband Level (Shoulder Extension) Level 2 (Red)    Retraction Theraband;10 reps    Theraband Level (Shoulder Retraction) Level 2 (Red)    Row Theraband;10 reps    Theraband Level (Shoulder Row) Level  2 (Red)    Protraction AROM;10 reps    Horizontal ABduction AROM;10 reps    External Rotation AROM;10 reps    Internal Rotation AROM;10 reps    Flexion AROM;10 reps;Limitations    Flexion Limitations to 90 degrees    Abduction AROM;10 reps;Limitations    ABduction Limitations to 90 degrees      Shoulder Exercises: ROM/Strengthening   Wall Wash 1'    Proximal Shoulder Strengthening, Supine 10X each, no rest breaks    Proximal Shoulder Strengthening, Seated 10X each, no rest breaks                      OT Short Term Goals - 05/04/21 1609       OT SHORT TERM GOAL #1   Title Pt will be provided with and educated on HEP to improve mobility of LUE required during ADL completion.    Time 4    Period Weeks    Status On-going    Target Date 05/30/21      OT SHORT TERM GOAL #2   Title Pt will decrease pain in LUE to 3/10 or less to improve ability to sleep for 3+ hours without waking due to pain.    Time 4    Period Weeks    Status On-going      OT SHORT TERM GOAL #3   Title Pt will decrease LUE fascial restrictions to min amounts or less to improve ability to complete functional reaching tasks.    Time 4    Period Weeks    Status On-going      OT SHORT TERM GOAL #4   Title Pt will increase LUE A/ROM to [facility] to improve ability to reach for clothes or items in closet.    Time 4    Period Weeks    Status On-going      OT SHORT TERM GOAL #5   Title Pt will increase LUE strength to 4/5 or greater to improve ability to lift cast iron skillet during meal prep tasks.     Time 4    Period Weeks    Status On-going                      Plan - 05/21/21 1008     Clinical Impression Statement A: Pt reports his arm has been feeling good until he slept on it last night. No manual therapy completed this session, continued with passive stretching, A/ROM in supine and standing. OT notes crepitus in bilateral shoulders during tasks. Added proximal shoulder strengthening in sitting, scapular theraband exercises. Rest breaks provided as needed, verbal cuing for form and technique. Pt reporting one instance of dizziness during session, BP 132/57, HR 71.    Body Structure / Function / Physical Skills ADL;Endurance;UE functional use;Fascial restriction;Pain;ROM;IADL;Strength    Plan P: Continue with A/ROM, functional reaching    OT Home Exercise Plan 8/16: A/ROM    Consulted and Agree with Plan of Care Patient             Patient will benefit from skilled therapeutic intervention in order to improve the following deficits and impairments:   Body Structure / Function / Physical Skills: ADL, Endurance, UE functional use, Fascial restriction, Pain, ROM, IADL, Strength       Visit Diagnosis: Stiffness of left shoulder, not elsewhere classified  Chronic left shoulder pain  Other symptoms and signs involving the musculoskeletal system    Problem List Patient Active  Problem List   Diagnosis Date Noted   Hypokalemia 05/18/2021   Muscular deconditioning 05/12/2021   Frequent falls 05/12/2021   Fatigue 04/29/2021   Recent unexplained weight loss 04/23/2021   Left shoulder pain 04/23/2021   Chest tightness 04/23/2021   Deep tissue injury 04/23/2021   Myoclonus 04/15/2021   Pneumonia 04/03/2021   PNA (pneumonia) 04/02/2021   Abnormal finding on urinalysis 04/01/2021   Syncope 02/16/2021   Minor head injury    Right hip pain 02/11/2021   Erectile dysfunction 01/22/2021   Gait abnormality 09/30/2020   Mild non proliferative diabetic retinopathy  ([place]) 07/16/2020   Need for immunization against influenza 06/03/2020   S/P left knee arthroscopy 05/06/20 05/13/2020   Abnormal MRI 03/26/2020   Joint disorder of knee 03/26/2020   Fall at home, initial encounter 03/13/2020   Anemia 09/03/2019   Reflux esophagitis 09/03/2019   Esophageal dysphagia 05/30/2019   Constipation 05/30/2019   Positive colorectal cancer screening using Cologuard test 05/30/2019   Diabetic foot ulcer ([place]) 09/06/2018   Orthostasis 08/25/2016   Congestive heart failure ([place]) 05/17/2016   Bilateral carotid artery disease ([place]) 07/08/2014   Obstructive sleep apnea 02/12/2014   Restless legs 10/23/2013   Overweight with body mass index (BMI) of 29 to 29.9 in adult 10/23/2013   Hyperlipidemia 07/16/2013   Peripheral arterial disease ([place]) 07/16/2013   Hypertension 03/14/2012   Type 2 diabetes mellitus with circulatory disorder ([place]) 03/14/2012   Peripheral neuropathy 03/14/2012   Cardiomyopathy- EF NL 01/2013, now 30-35% echo 03/14/2012    Guadelupe Sabin, OTR/L  [phone] 05/21/2021, 10:30 AM  [facility] [place], [place], [address] Phone: [phone]   Fax:  [phone]  Name: DARIES LARDIERI MRN: [id number] Date of Birth: [birth year]

## 2021-05-21 NOTE — Telephone Encounter (Signed)
thanks

## 2021-05-21 NOTE — Therapy (Signed)
[facility] [address] [place], [place], [address] Phone: [phone]   Fax:  [phone]  Physical Therapy Treatment  Patient Details  Name: [name] MRN: [id number] Date of Birth: August 19, 1946 Referring Provider (PT): Heather Roberts, NP  Progress Note Reporting Period 04/06/21 to 05/21/21  See note below for Objective Data and Assessment of Progress/Goals.     Encounter Date: 05/21/2021   PT End of Session - 05/21/21 1453     Visit Number 10    Number of Visits 18    Date for PT Re-Evaluation 06/18/21    Authorization Type [employer], no VL, no auth ,    Progress Note Due on Visit 18    PT Start Time 1435    PT Stop Time 1515    PT Time Calculation (min) 40 min    Equipment Utilized During Treatment Gait belt    Activity Tolerance Patient tolerated treatment well    Behavior During Therapy WFL for tasks assessed/performed             Past Medical History:  Diagnosis Date   Aftercare following surgery of the circulatory system, NEC 12/04/2013   Angina decubitus (HCC) 05/19/2016   Arrhythmia 05/20/2016   Arthritis    ARTHRITIS, RIGHT FOOT 06/26/2008   Qualifier: Diagnosis of  By: [name] MD, [name]     Cardiomyopathy- EF NL 01/2013, now 30-35% echo 03/14/2012   Carotid artery occlusion    left s/p CEA   Cellulitis of left foot 05/07/2018   CHF (congestive heart failure) (HCC)    Critical lower limb ischemia (HCC) 02/28/2014   Critical limb ischemia    Diabetes mellitus    Elevated troponin 04/11/2016   GERD (gastroesophageal reflux disease)    History of stroke June 2013 03/14/2012   HOH (hard of hearing)    Hypertension    Hypothyroidism    Nonhealing skin ulcer (HCC) 10/28/2014   Obstructive sleep apnea    Orthostatic hypotension    Osteomyelitis (HCC)    left great toe   Pain in the chest 05/17/2016   Peripheral arterial disease (HCC), s/p PTA x 2 RLE    nonhealing ulcers bilaterally on each great toe    Pneumonia    PONV (postoperative nausea and vomiting)    Pulmonary nodule 05/17/2016   Restless leg syndrome    Shortness of breath    Stroke [facility]) March 08, 2012   Toe osteomyelitis, left [facility])    Ulcer of great toe, left, with necrosis of bone (HCC)    Wears dentures     Past Surgical History:  Procedure Laterality Date   AMPUTATION TOE Left 07/25/2018   Procedure: AMPUTATION TOE INTERPHALANGEAL HALLUX LEFT;  Surgeon: Park Liter, DPM;  Location: [facility] OR;  Service: Podiatry;  Laterality: Left;   ANGIOPLASTY  02/28/14   diamond back orbital rotational atherectomy of Rt. tibial   BACK SURGERY     BONE BIOPSY Left 07/25/2018   Procedure: SUPERFICIAL BONE BIOPSY;  Surgeon: Park Liter, DPM;  Location: [facility] OR;  Service: Podiatry;  Laterality: Left;   CARDIAC CATHETERIZATION N/A 05/19/2016   Procedure: Right/Left Heart Cath and Coronary Angiography;  Surgeon: [name], MD;  Location: [facility] INVASIVE CV LAB;  Service: Cardiovascular;  Laterality: N/A;   CERVICAL FUSION     ENDARTERECTOMY Left 11/29/2013   Procedure: ENDARTERECTOMY CAROTID;  Surgeon: [name], MD;  Location[facility] OR;  Service: Vascular;  Laterality: Left;  ESOPHAGOGASTRODUODENOSCOPY  02/2010   Dr. Gala Romney: patient presented with food impaction, schatzki ring with superimposed component of stricture with erosive reflux esophagitis, s/p disimpaction but dilation planned at later date    ESOPHAGOGASTRODUODENOSCOPY (EGD) WITH PROPOFOL N/A 08/13/2019   Dr. Gala Romney: Erosive reflux esophagitis with mild stricture and incidental Mallory-Weiss tear which precluded esophageal dilation.  Medium sized hiatal hernia.   ESOPHAGOGASTRODUODENOSCOPY (EGD) WITH PROPOFOL N/A 10/11/2019   Dr. Gala Romney: Esophageal stenosis status post dilation, moderate hiatal hernia   EYE SURGERY     FLEXIBLE SIGMOIDOSCOPY N/A 08/13/2019   Procedure: FLEXIBLE SIGMOIDOSCOPY;  Surgeon: [name], MD;  Location: [facility] ENDO SUITE;  Service: Endoscopy;   Laterality: N/A;  colonoscopy aboerted due to formed stool and poor prep   FOOT SURGERY     KNEE ARTHROSCOPY WITH MEDIAL MENISECTOMY Left 05/06/2020   Procedure: KNEE ARTHROSCOPY WITH MEDIAL MENISCECTOMY AND LATERAL MENISCECTOMY;  Surgeon: [name], MD;  Location: [facility] ORS;  Service: Orthopedics;  Laterality: Left;   Lower ext duplex doppler  03/14/14   Rt ABI 1.2   LOWER EXTREMITY ANGIOGRAM Bilateral 02/18/2014   Procedure: LOWER EXTREMITY ANGIOGRAM;  Surgeon: [name], MD;  Location: [facility] CATH LAB;  Service: Cardiovascular;  Laterality: Bilateral;   LOWER EXTREMITY ANGIOGRAM N/A 10/31/2014   Procedure: LOWER EXTREMITY ANGIOGRAM;  Surgeon: [name], MD;  Location: [facility] CATH LAB;  Service: Cardiovascular;  Laterality: N/A;   MALONEY DILATION N/A 10/11/2019   Procedure: Venia Minks DILATION;  Surgeon: [name], MD;  [facility] ENDO SUITE;  Service: Endoscopy;  Laterality: N/A;   MULTIPLE TOOTH EXTRACTIONS     PV angiogram  02/18/2014   tibial vessel diseas bil.   SPINE SURGERY     tendon achillies lengthing and sesamoid      There were no vitals filed for this visit.   Subjective Assessment - 05/21/21 1444     Subjective Patient reports 50% improvement with hip and gait since starting therapy. He describes another issue he is having with energy levels and feels this is impacting his ability to complete therapy. He had blood work done today which he is awaiting results for to further explore this.    Pertinent History CHF , R foot wound, left knee scope, DB, Restless leg syndrome, hx of stroke.    Limitations House hold activities;Standing;Lifting    Currently in Pain? No/denies                [facility] PT Assessment - 05/21/21 1454       Assessment   Medical Diagnosis gait and balance.    Referring Provider (PT) Noreene Larsson, NP      Precautions   Precautions Fall      Balance Screen   Has the patient fallen in the past 6 months Yes    How many times? 11     Has the patient had a decrease in activity level because of a fear of falling?  Yes    Is the patient reluctant to leave their home because of a fear of falling?  No      Home Environment   Living Environment Private residence    Living Arrangements Spouse/significant other    Available Help at Discharge Family      Prior Function   Level of Independence Independent with basic ADLs;Independent with household mobility with device      Cognition   Overall Cognitive Status Within Functional Limits for tasks assessed  Observation/Other Assessments   Focus on Therapeutic Outcomes (FOTO)  26% function      Transfers   Five time sit to stand comments  13 seconds with use of UES, but uses heavy momentum use of UEs and does not fully extend knees      Ambulation/Gait   Ambulation/Gait Yes    Ambulation/Gait Assistance 5: Supervision    Assistive device Rollator    Gait Pattern Step-through pattern;Scissoring;Trendelenburg    Ambulation Surface Level;Indoor    Gait Comments                                     PT Short Term Goals - 05/21/21 1501       PT SHORT TERM GOAL #1   Title Patient will be independent in self management strategies to improve quality of life and functional outcomes.    Baseline Reports variable compliance    Time 3    Period Weeks    Status Partially Met    Target Date 04/27/21      PT SHORT TERM GOAL #2   Title Patient will be able to report at least 25% improvement in overall mobility.    Baseline Reports 50% improved    Time 3    Period Weeks    Status Achieved    Target Date 04/27/21      PT SHORT TERM GOAL #3   Title Demo improved balance and BLE strength as evidenced by time of 18 sec 5xSTS test    Baseline 13 seconds with use of UES, but uses heavy momentum use of UEs and does not fully extend knees    Time 3    Period Weeks    Status Partially Met    Target Date 04/27/21               PT Long  Term Goals - 05/21/21 1503       PT LONG TERM GOAL #1   Title Patient will improve FOTO score by at least 10 points in order to indicate improved tolerance to activity.    Baseline 24% decrease    Time 6    Period Weeks    Status On-going      PT LONG TERM GOAL #2   Title Demo improved gait velocity and safety as evidenced by distance of 150 ft during    Baseline 60 ft with rollator    Time 6    Period Weeks    Status On-going                   Plan - 05/21/21 1511     Clinical Impression Statement Patient shows slow progress toward therapy goals. He describes limitation due to some other issues he is having related to energy and fatigue. He is currently working on this and feels he will soon have some form of resolution with this. He would like to continue physical therapy for strengthening and balance at this time. He is also scheduled for an upcoming foot wound evaluation. At this time, patient would likely benefit from continued therapy to address remaining deficits to reduce pain, improve functional mobility and reduce risk for future falls.    Personal Factors and Comorbidities Comorbidity 1;Comorbidity 3+;Comorbidity 2    Comorbidities CHF, left knee surgery, right chronic foot wound, DB, restless leg syndromes    Examination-Activity Limitations Bathing;Lift;Locomotion Level;Transfers;Stand;Stairs;Squat;Reach Overhead;Carry  Examination-Participation Restrictions Community Activity;Meal Prep    Stability/Clinical Decision Making Evolving/Moderate complexity    Clinical Decision Making Moderate    Rehab Potential Fair    PT Frequency 2x / week    PT Duration 4 weeks    PT Treatment/Interventions ADLs/Self Care Home Management;Other (comment);Moist Heat;Balance training;Therapeutic exercise;Therapeutic activities;Functional mobility training;Stair training;Gait training;DME Instruction;Ultrasound;Neuromuscular re-education;Patient/family education;Orthotic  Fit/Training;Manual techniques;Taping;Passive range of motion;Electrical Stimulation    PT Next Visit Plan Continue LE strength, gait and balance as tolerated.    PT Home Exercise Plan hip abd/add isometric, QS, hamstring stretch;  04/15/21: Isometric abd, abd supine, SAQ, bridge 9/1 clamshell, sideyling hip abduciton    Consulted and Agree with Plan of Care Patient             Patient will benefit from skilled therapeutic intervention in order to improve the following deficits and impairments:  Abnormal gait, Decreased endurance, Decreased skin integrity, Increased edema, Decreased activity tolerance, Decreased balance, Decreased mobility, Decreased knowledge of use of DME, Decreased strength, Difficulty walking, Pain, Decreased range of motion, Impaired flexibility, Postural dysfunction, Improper body mechanics  Visit Diagnosis: Muscle weakness (generalized)  Difficulty in walking, not elsewhere classified  Unsteadiness on feet     Problem List Patient Active Problem List   Diagnosis Date Noted   Hypokalemia 05/18/2021   Muscular deconditioning 05/12/2021   Frequent falls 05/12/2021   Fatigue 04/29/2021   Recent unexplained weight loss 04/23/2021   Left shoulder pain 04/23/2021   Chest tightness 04/23/2021   Deep tissue injury 04/23/2021   Myoclonus 04/15/2021   Pneumonia 04/03/2021   PNA (pneumonia) 04/02/2021   Abnormal finding on urinalysis 04/01/2021   Syncope 02/16/2021   Minor head injury    Right hip pain 02/11/2021   Erectile dysfunction 01/22/2021   Gait abnormality 09/30/2020   Mild non proliferative diabetic retinopathy ([place]) 07/16/2020   Need for immunization against influenza 06/03/2020   S/P left knee arthroscopy 05/06/20 05/13/2020   Abnormal MRI 03/26/2020   Joint disorder of knee 03/26/2020   Fall at home, initial encounter 03/13/2020   Anemia 09/03/2019   Reflux esophagitis 09/03/2019   Esophageal dysphagia 05/30/2019   Constipation 05/30/2019    Positive colorectal cancer screening using Cologuard test 05/30/2019   Diabetic foot ulcer ([place]) 09/06/2018   Orthostasis 08/25/2016   Congestive heart failure ([place]) 05/17/2016   Bilateral carotid artery disease ([place]) 07/08/2014   Obstructive sleep apnea 02/12/2014   Restless legs 10/23/2013   Overweight with body mass index (BMI) of 29 to 29.9 in adult 10/23/2013   Hyperlipidemia 07/16/2013   Peripheral arterial disease ([place]) 07/16/2013   Hypertension 03/14/2012   Type 2 diabetes mellitus with circulatory disorder ([place]) 03/14/2012   Peripheral neuropathy 03/14/2012   Cardiomyopathy- EF NL 01/2013, now 30-35% echo 03/14/2012   3:18 PM, 05/21/21 Josue Hector PT DPT  Physical Therapist with [facility]  [phone] [place] [address] [place], [place], [address] Phone: [phone]   Fax:  [phone]  Name: [name] MRN: [id number] Date of Birth: 16-Jun-1946

## 2021-05-21 NOTE — Telephone Encounter (Signed)
[name] [facility] is calling because the patient is there for his wound care, and this is where he wants to go but looking in epic, was sent to [facility] instead.Please advise. Resent referral to [facility] per patient's request.

## 2021-05-21 NOTE — Patient Instructions (Signed)
Access Code: [email or web address] URL: [email or web address] Date: 05/21/2021 Prepared by: Josue Hector  Exercises Hooklying Clamshell with Resistance - 2 x daily - 7 x weekly - 2 sets - 10 reps Clamshell - 2 x daily - 7 x weekly - 2 sets - 10 reps Sidelying Hip Abduction - 2 x daily - 7 x weekly - 2 sets - 10 reps

## 2021-05-22 ENCOUNTER — Telehealth: Payer: Self-pay | Admitting: Nurse Practitioner

## 2021-05-22 NOTE — Telephone Encounter (Signed)
See lab note.  

## 2021-05-22 NOTE — Telephone Encounter (Signed)
Please call the pt regarding lab work

## 2021-05-24 LAB — BASIC METABOLIC PANEL
BUN/Creatinine Ratio: 18 (ref 10–24)
BUN: 13 mg/dL (ref 8–27)
CO2: 26 mmol/L (ref 20–29)
Calcium: 9.1 mg/dL (ref 8.6–10.2)
Chloride: 100 mmol/L (ref 96–106)
Creatinine, Ser: 0.74 mg/dL — ABNORMAL LOW (ref 0.76–1.27)
Glucose: 86 mg/dL (ref 65–99)
Potassium: 3.7 mmol/L (ref 3.5–5.2)
Sodium: 139 mmol/L (ref 134–144)
eGFR: 93 mL/min/{1.73_m2} (ref >59)

## 2021-05-24 LAB — TESTOSTERONE, FREE, TOTAL, SHBG
Sex Hormone Binding: 74.3 nmol/L (ref 19.3–76.4)
Testosterone, Free: 5.1 pg/mL — ABNORMAL LOW (ref 6.6–18.1)
Testosterone: 350 ng/dL (ref 264–916)

## 2021-05-27 ENCOUNTER — Other Ambulatory Visit: Payer: Self-pay

## 2021-05-27 ENCOUNTER — Ambulatory Visit (HOSPITAL_COMMUNITY): Payer: HMO

## 2021-05-27 ENCOUNTER — Encounter (HOSPITAL_COMMUNITY): Payer: Self-pay

## 2021-05-27 DIAGNOSIS — M6281 Muscle weakness (generalized): Secondary | ICD-10-CM | POA: Diagnosis not present

## 2021-05-27 DIAGNOSIS — R29898 Other symptoms and signs involving the musculoskeletal system: Secondary | ICD-10-CM

## 2021-05-27 DIAGNOSIS — G8929 Other chronic pain: Secondary | ICD-10-CM

## 2021-05-27 DIAGNOSIS — M25612 Stiffness of left shoulder, not elsewhere classified: Secondary | ICD-10-CM

## 2021-05-27 NOTE — Therapy (Addendum)
[facility] [address] [place], [place], [address] Phone: [phone]   Fax:  [phone]  Occupational Therapy Treatment  Patient Details  Name: [name] MRN: [id number] Date of Birth: [birth year] Referring Provider (OT): Demetrius Revel, NP   Encounter Date: 05/27/2021   OT End of Session - 05/27/21 1716     Visit Number 5    Number of Visits 8    Date for OT Re-Evaluation 05/30/21    Authorization Type [employer]; $30 copay    Authorization Time Period no visit limit    Progress Note Due on Visit 10    OT Start Time 1350   pt arrived late   OT Stop Time [id number]    OT Time Calculation (min) 33 min    Activity Tolerance Patient tolerated treatment well    Behavior During Therapy [facility] for tasks assessed/performed             Past Medical History:  Diagnosis Date   Aftercare following surgery of the circulatory system, NEC 12/04/2013   Angina decubitus ([place]) 05/19/2016   Arrhythmia 05/20/2016   Arthritis    ARTHRITIS, RIGHT FOOT 06/26/2008   Qualifier: Diagnosis of  By: Aline Brochure MD, [name]     Cardiomyopathy- EF NL 01/2013, now 30-35% echo 03/14/2012   Carotid artery occlusion    left s/p CEA   Cellulitis of left foot 05/07/2018   CHF (congestive heart failure) ([place])    Critical lower limb ischemia ([place]) 02/28/2014   Critical limb ischemia    Diabetes mellitus    Elevated troponin 04/11/2016   GERD (gastroesophageal reflux disease)    History of stroke June 2013 03/14/2012   HOH (hard of hearing)    Hypertension    Hypothyroidism    Nonhealing skin ulcer ([place]) 10/28/2014   Obstructive sleep apnea    Orthostatic hypotension    Osteomyelitis ([place])    left great toe   Pain in the chest 05/17/2016   Peripheral arterial disease ([place]), s/p PTA x 2 RLE    nonhealing ulcers bilaterally on each great toe   Pneumonia    PONV (postoperative nausea and vomiting)    Pulmonary nodule 05/17/2016   Restless leg syndrome     Shortness of breath    Stroke [facility]) March 08, 2012   Toe osteomyelitis, left [facility])    Ulcer of great toe, left, with necrosis of bone ([place])    Wears dentures     Past Surgical History:  Procedure Laterality Date   AMPUTATION TOE Left 07/25/2018   Procedure: AMPUTATION TOE INTERPHALANGEAL HALLUX LEFT;  Surgeon: Evelina Bucy, DPM;  Location: [place];  Service: Podiatry;  Laterality: Left;   ANGIOPLASTY  02/28/14   diamond back orbital rotational atherectomy of Rt. tibial   BACK SURGERY     BONE BIOPSY Left 07/25/2018   Procedure: SUPERFICIAL BONE BIOPSY;  Surgeon: Evelina Bucy, DPM;  Location: [place];  Service: Podiatry;  Laterality: Left;   CARDIAC CATHETERIZATION N/A 05/19/2016   Procedure: Right/Left Heart Cath and Coronary Angiography;  Surgeon: Belva Crome, MD;  Location: [place] CV LAB;  Service: Cardiovascular;  Laterality: N/A;   CERVICAL FUSION     ENDARTERECTOMY Left 11/29/2013   Procedure: ENDARTERECTOMY CAROTID;  Surgeon: Serafina Mitchell, MD;  Location: [place];  Service: Vascular;  Laterality: Left;   ESOPHAGOGASTRODUODENOSCOPY  02/2010   Dr. Gala Romney: patient presented with food impaction, schatzki ring with superimposed component of stricture with erosive  reflux esophagitis, s/p disimpaction but dilation planned at later date    ESOPHAGOGASTRODUODENOSCOPY (EGD) WITH PROPOFOL N/A 08/13/2019   Dr. Gala Romney: Erosive reflux esophagitis with mild stricture and incidental Mallory-Weiss tear which precluded esophageal dilation.  Medium sized hiatal hernia.   ESOPHAGOGASTRODUODENOSCOPY (EGD) WITH PROPOFOL N/A 10/11/2019   Dr. Gala Romney: Esophageal stenosis status post dilation, moderate hiatal hernia   EYE SURGERY     FLEXIBLE SIGMOIDOSCOPY N/A 08/13/2019   Procedure: FLEXIBLE SIGMOIDOSCOPY;  Surgeon: [name], MD;  [facility];  Service: Endoscopy;  Laterality: N/A;  colonoscopy aboerted due to formed stool and poor prep   FOOT SURGERY     KNEE ARTHROSCOPY WITH MEDIAL  MENISECTOMY Left 05/06/2020   Procedure: KNEE ARTHROSCOPY WITH MEDIAL MENISCECTOMY AND LATERAL MENISCECTOMY;  Surgeon: [name], MD;  Location: [facility] ORS;  Service: Orthopedics;  Laterality: Left;   Lower ext duplex doppler  03/14/14   Rt ABI 1.2   LOWER EXTREMITY ANGIOGRAM Bilateral 02/18/2014   Procedure: LOWER EXTREMITY ANGIOGRAM;  Surgeon: [name], MD;  Location: [facility] CATH LAB;  Service: Cardiovascular;  Laterality: Bilateral;   LOWER EXTREMITY ANGIOGRAM N/A 10/31/2014   Procedure: LOWER EXTREMITY ANGIOGRAM;  Surgeon: [name], MD;  Location: [facility] CATH LAB;  Service: Cardiovascular;  Laterality: N/A;   MALONEY DILATION N/A 10/11/2019   Procedure: Venia Minks DILATION;  Surgeon: [name], MD;  [facility];  Service: Endoscopy;  Laterality: N/A;   MULTIPLE TOOTH EXTRACTIONS     PV angiogram  02/18/2014   tibial vessel diseas bil.   SPINE SURGERY     tendon achillies lengthing and sesamoid      There were no vitals filed for this visit.       [facility] OT Assessment - 05/27/21 1418       Assessment   Medical Diagnosis Chronic left shoulder pain      Precautions   Precautions Fall                      OT Treatments/Exercises (OP) - 05/27/21 1419       Exercises   Exercises Shoulder      Shoulder Exercises: Supine   Protraction PROM;5 reps    Horizontal ABduction PROM;5 reps    External Rotation PROM;10 reps   combined with abduction   Internal Rotation PROM;5 reps    Flexion PROM;5 reps    ABduction PROM;10 reps   combined with external rotation     Shoulder Exercises: Seated   Protraction AROM;10 reps    External Rotation AROM;10 reps    Internal Rotation AROM;10 reps    Flexion AROM;10 reps;Limitations    Flexion Limitations to 90 degrees      Functional Reaching Activities   High Level high level reaching task completed standing while placing 10 cones on the highest shelf using left UE. Seated rest breaks taken after all  cones were placed. 3 cones were placed on the middle shelf due to muscle fatigue.      Manual Therapy   Manual Therapy Soft tissue mobilization    Manual therapy comments Manual therapy completed prior to exercises    Soft tissue mobilization Provided soft tissue mobilization during dynamic passive shoulder stretching. Combined abduction and external rotation focusing on achieving his left hand behind his head.                  Upper Extremity Functional Index Score :   /80  OT Short Term Goals - 05/04/21 1609       OT SHORT TERM GOAL #1   Title Pt will be provided with and educated on HEP to improve mobility of LUE required during ADL completion.    Time 4    Period Weeks    Status On-going    Target Date 05/30/21      OT SHORT TERM GOAL #2   Title Pt will decrease pain in LUE to 3/10 or less to improve ability to sleep for 3+ hours without waking due to pain.    Time 4    Period Weeks    Status On-going      OT SHORT TERM GOAL #3   Title Pt will decrease LUE fascial restrictions to min amounts or less to improve ability to complete functional reaching tasks.    Time 4    Period Weeks    Status On-going      OT SHORT TERM GOAL #4   Title Pt will increase LUE A/ROM to [facility] to improve ability to reach for clothes or items in closet.    Time 4    Period Weeks    Status On-going      OT SHORT TERM GOAL #5   Title Pt will increase LUE strength to 4/5 or greater to improve ability to lift cast iron skillet during meal prep tasks.    Time 4    Period Weeks    Status On-going                      Plan - 05/27/21 1717     Clinical Impression Statement A: Spent more time supine completing soft tissue mobilization while working on abduction combined with external rotation. patient reports that he has increased difficulty shaving the back of his head. Did achieve slightly more ROM after soft tissue work. Pt is able to demonstrate further A/ROM flexion  standing versus sitting. Completed functional reaching task with patient able to reach top shelf of cabinet with min-mod difficulty. VC for form and technique were provided.    Plan P: Reassessment. FOTO. Discharge with HEP. Update HEP if needed. Remind that he does have better range when standing so when reaching at a higher level he'll be more successful standing.    Consulted and Agree with Plan of Care Patient             Patient will benefit from skilled therapeutic intervention in order to improve the following deficits and impairments:           Visit Diagnosis: Stiffness of left shoulder, not elsewhere classified  Other symptoms and signs involving the musculoskeletal system  Chronic left shoulder pain    Problem List Patient Active Problem List   Diagnosis Date Noted   Hypokalemia 05/18/2021   Muscular deconditioning 05/12/2021   Frequent falls 05/12/2021   Fatigue 04/29/2021   Recent unexplained weight loss 04/23/2021   Left shoulder pain 04/23/2021   Chest tightness 04/23/2021   Deep tissue injury 04/23/2021   Myoclonus 04/15/2021   Pneumonia 04/03/2021   PNA (pneumonia) 04/02/2021   Abnormal finding on urinalysis 04/01/2021   Syncope 02/16/2021   Minor head injury    Right hip pain 02/11/2021   Erectile dysfunction 01/22/2021   Gait abnormality 09/30/2020   Mild non proliferative diabetic retinopathy ([place]) 07/16/2020   Need for immunization against influenza 06/03/2020   S/P left knee arthroscopy 05/06/20 05/13/2020   Abnormal MRI 03/26/2020  Joint disorder of knee 03/26/2020   Fall at home, initial encounter 03/13/2020   Anemia 09/03/2019   Reflux esophagitis 09/03/2019   Esophageal dysphagia 05/30/2019   Constipation 05/30/2019   Positive colorectal cancer screening using Cologuard test 05/30/2019   Diabetic foot ulcer ([place]) 09/06/2018   Orthostasis 08/25/2016   Congestive heart failure ([place]) 05/17/2016   Bilateral carotid artery disease ([place])  07/08/2014   Obstructive sleep apnea 02/12/2014   Restless legs 10/23/2013   Overweight with body mass index (BMI) of 29 to 29.9 in adult 10/23/2013   Hyperlipidemia 07/16/2013   Peripheral arterial disease ([place]) 07/16/2013   Hypertension 03/14/2012   Type 2 diabetes mellitus with circulatory disorder ([place]) 03/14/2012   Peripheral neuropathy 03/14/2012   Cardiomyopathy- EF NL 01/2013, now 30-35% echo 03/14/2012    Ailene Ravel, OTR/L,CBIS  [phone]  05/28/2021, 8:09 AM  [facility] [address] [place], [place], [address] Phone: [phone]   Fax:  [phone]  Name: [name] MRN: [id number] Date of Birth: [birth year]

## 2021-05-29 ENCOUNTER — Ambulatory Visit (HOSPITAL_COMMUNITY): Payer: HMO | Admitting: Occupational Therapy

## 2021-05-29 ENCOUNTER — Other Ambulatory Visit: Payer: Self-pay

## 2021-05-29 ENCOUNTER — Encounter (HOSPITAL_COMMUNITY): Payer: Self-pay | Admitting: Occupational Therapy

## 2021-05-29 DIAGNOSIS — R29898 Other symptoms and signs involving the musculoskeletal system: Secondary | ICD-10-CM

## 2021-05-29 DIAGNOSIS — M25512 Pain in left shoulder: Secondary | ICD-10-CM

## 2021-05-29 DIAGNOSIS — G8929 Other chronic pain: Secondary | ICD-10-CM

## 2021-05-29 DIAGNOSIS — M25612 Stiffness of left shoulder, not elsewhere classified: Secondary | ICD-10-CM

## 2021-05-29 DIAGNOSIS — M6281 Muscle weakness (generalized): Secondary | ICD-10-CM | POA: Diagnosis not present

## 2021-05-29 NOTE — Therapy (Signed)
[place], [place], [address] Phone: [phone]   Fax:  [phone]  Occupational Therapy Treatment and reassessment  Patient Details  Name: [name] MRN: [id number] Date of Birth: [birth year] Referring Provider (OT): Demetrius Revel, NP   Encounter Date: 05/29/2021   OT End of Session - 05/29/21 1234     Visit Number 6    Number of Visits 10    Date for OT Re-Evaluation 06/26/21    Authorization Type [employer]; $30 copay    Authorization Time Period no visit limit    Progress Note Due on Visit 10    OT Start Time 1115    OT Stop Time 1202    OT Time Calculation (min) 47 min    Activity Tolerance Patient tolerated treatment well    Behavior During Therapy [facility] for tasks assessed/performed             Past Medical History:  Diagnosis Date   Aftercare following surgery of the circulatory system, NEC 12/04/2013   Angina decubitus ([place]) 05/19/2016   Arrhythmia 05/20/2016   Arthritis    ARTHRITIS, RIGHT FOOT 06/26/2008   Qualifier: Diagnosis of  By: Aline Brochure MD, [name]     Cardiomyopathy- EF NL 01/2013, now 30-35% echo 03/14/2012   Carotid artery occlusion    left s/p CEA   Cellulitis of left foot 05/07/2018   CHF (congestive heart failure) ([place])    Critical lower limb ischemia ([place]) 02/28/2014   Critical limb ischemia    Diabetes mellitus    Elevated troponin 04/11/2016   GERD (gastroesophageal reflux disease)    History of stroke June 2013 03/14/2012   HOH (hard of hearing)    Hypertension    Hypothyroidism    Nonhealing skin ulcer ([place]) 10/28/2014   Obstructive sleep apnea    Orthostatic hypotension    Osteomyelitis ([place])    left great toe   Pain in the chest 05/17/2016   Peripheral arterial disease ([place]), s/p PTA x 2 RLE    nonhealing ulcers bilaterally on each great toe   Pneumonia    PONV (postoperative nausea and vomiting)    Pulmonary nodule 05/17/2016   Restless leg syndrome     Shortness of breath    Stroke [facility]) March 08, 2012   Toe osteomyelitis, left [facility])    Ulcer of great toe, left, with necrosis of bone ([place])    Wears dentures     Past Surgical History:  Procedure Laterality Date   AMPUTATION TOE Left 07/25/2018   Procedure: AMPUTATION TOE INTERPHALANGEAL HALLUX LEFT;  Surgeon: Evelina Bucy, DPM;  Location: [place];  Service: Podiatry;  Laterality: Left;   ANGIOPLASTY  02/28/14   diamond back orbital rotational atherectomy of Rt. tibial   BACK SURGERY     BONE BIOPSY Left 07/25/2018   Procedure: SUPERFICIAL BONE BIOPSY;  Surgeon: Evelina Bucy, DPM;  Location: [place];  Service: Podiatry;  Laterality: Left;   CARDIAC CATHETERIZATION N/A 05/19/2016   Procedure: Right/Left Heart Cath and Coronary Angiography;  Surgeon: Belva Crome, MD;  Location: [place] CV [facility];  Service: Cardiovascular;  Laterality: N/A;   CERVICAL FUSION     ENDARTERECTOMY Left 11/29/2013   Procedure: ENDARTERECTOMY CAROTID;  Surgeon: Serafina Mitchell, MD;  Location: [place];  Service: Vascular;  Laterality: Left;   ESOPHAGOGASTRODUODENOSCOPY  02/2010   Dr. Gala Romney: patient presented with food impaction, schatzki ring with superimposed component of stricture with erosive reflux esophagitis,  s/p disimpaction but dilation planned at later date    ESOPHAGOGASTRODUODENOSCOPY (EGD) WITH PROPOFOL N/A 08/13/2019   Dr. Gala Romney: Erosive reflux esophagitis with mild stricture and incidental Mallory-Weiss tear which precluded esophageal dilation.  Medium sized hiatal hernia.   ESOPHAGOGASTRODUODENOSCOPY (EGD) WITH PROPOFOL N/A 10/11/2019   Dr. Gala Romney: Esophageal stenosis status post dilation, moderate hiatal hernia   EYE SURGERY     FLEXIBLE SIGMOIDOSCOPY N/A 08/13/2019   Procedure: FLEXIBLE SIGMOIDOSCOPY;  Surgeon: [name], MD;  [facility];  Service: Endoscopy;  Laterality: N/A;  colonoscopy aboerted due to formed stool and poor prep   FOOT SURGERY     KNEE ARTHROSCOPY WITH MEDIAL  MENISECTOMY Left 05/06/2020   Procedure: KNEE ARTHROSCOPY WITH MEDIAL MENISCECTOMY AND LATERAL MENISCECTOMY;  Surgeon: [name], MD;  Location: [facility];  Service: Orthopedics;  Laterality: Left;   Lower ext duplex doppler  03/14/14   Rt ABI 1.2   LOWER EXTREMITY ANGIOGRAM Bilateral 02/18/2014   Procedure: LOWER EXTREMITY ANGIOGRAM;  Surgeon: [name], MD;  Location: [facility] CATH LAB;  Service: Cardiovascular;  Laterality: Bilateral;   LOWER EXTREMITY ANGIOGRAM N/A 10/31/2014   Procedure: LOWER EXTREMITY ANGIOGRAM;  Surgeon: [name], MD;  Location: [facility] CATH LAB;  Service: Cardiovascular;  Laterality: N/A;   MALONEY DILATION N/A 10/11/2019   Procedure: Venia Minks DILATION;  Surgeon: [name], MD;  [facility];  Service: Endoscopy;  Laterality: N/A;   MULTIPLE TOOTH EXTRACTIONS     PV angiogram  02/18/2014   tibial vessel diseas bil.   SPINE SURGERY     tendon achillies lengthing and sesamoid      There were no vitals filed for this visit.   Subjective Assessment - 05/29/21 1118     Subjective  S: Dicharge seems premature.    Special Tests 42/100    Currently in Pain? Yes    Pain Score 3     Pain Location Shoulder    Pain Orientation Left    Pain Descriptors / Indicators Sore    Pain Type Chronic pain    Pain Onset More than a month ago    Pain Frequency Constant    Aggravating Factors  sleeping on it    Pain Relieving Factors medication; moving and stretching helps but still limited    Effect of Pain on Daily Activities mod effect ADL's    Multiple Pain Sites No                [facility] OT Assessment - 05/29/21 0001       Assessment   Medical Diagnosis Chronic left shoulder pain      Precautions   Precautions Fall      Observation/Other Assessments   Focus on Therapeutic Outcomes (FOTO)  42      AROM   Overall AROM  Deficits    Overall AROM Comments Assessed seated, er/IR adducted    AROM Assessment Site Shoulder    Right/Left Shoulder  Left    Left Shoulder Flexion 85 Degrees   108prev   Left Shoulder ABduction 110 Degrees   110 previous   Left Shoulder Internal Rotation 90 Degrees    Left Shoulder External Rotation 14 Degrees   11 revious     PROM   Overall PROM  Deficits    Overall PROM Comments Assessed seated, er/IR adducted    PROM Assessment Site Shoulder    Right/Left Shoulder Left    Left Shoulder Flexion 120 Degrees  118 previous   Left Shoulder ABduction 100 Degrees   120   Left Shoulder Internal Rotation 90 Degrees    Left Shoulder External Rotation 20 Degrees   20 previous     Strength   Overall Strength Deficits    Overall Strength Comments Assessed seated, er/IR adducted    Strength Assessment Site Shoulder    Right/Left Shoulder Left    Left Shoulder Flexion 3-/5    Left Shoulder ABduction 3-/5    Left Shoulder Internal Rotation 3/5    Left Shoulder External Rotation 2+/5                      OT Treatments/Exercises (OP) - 05/29/21 0001       Exercises   Exercises Shoulder      Shoulder Exercises: Supine   Protraction PROM;5 reps    Horizontal ABduction PROM;5 reps    External Rotation PROM;5 reps;Left    Internal Rotation PROM;5 reps    Flexion PROM;5 reps    ABduction PROM;5 reps;Left      Shoulder Exercises: ROM/Strengthening   UBE (Upper Arm Bike) 2 min forward level 1, 5.5 to 6 pace; 2 min reverse level 2 9 to 9.5 pace                    OT Education - 05/29/21 1254     Education Details Pt educated on how to safely exercise at [facility] per pt's question about doing so.    Person(s) Educated Patient    Methods Explanation    Comprehension Verbalized understanding              OT Short Term Goals - 05/29/21 1322       OT SHORT TERM GOAL #1   Title Pt will be provided with and educated on HEP to improve mobility of LUE required during ADL completion.    Baseline 9/9: Pt will need continued education of tasks to increase L UE function. Pt reports  moderate difficult with ADL tasks due to L UE.    Time 4    Period Weeks    Status On-going    Target Date 06/26/21      OT SHORT TERM GOAL #2   Title Pt will decrease pain in LUE to 3/10 or less to improve ability to sleep for 3+ hours without waking due to pain.    Baseline 9/9: Pt does reported 3/10 pain but is not able to consistently sleep 3+ hours without waking due to pain. Pt reports that need to urnate due to water pills is also a contributing factor.    Time 4    Period Weeks    Status Partially Met      OT SHORT TERM GOAL #3   Title Pt will decrease LUE fascial restrictions to min amounts or less to improve ability to complete functional reaching tasks.    Baseline 9/9: Pt reports continued deficits in functional reaching.    Time 4    Period Weeks    Status On-going      OT SHORT TERM GOAL #4   Title Pt will increase LUE A/ROM to [facility] to improve ability to reach for clothes or items in closet.    Baseline 9/9: 9/9: Pt reports continued deficits in functional reaching overhead.    Time 4    Period Weeks    Status On-going      OT SHORT TERM GOAL #5   Title Pt  will increase LUE strength to 4/5 or greater to improve ability to lift cast iron skillet during meal prep tasks.    Baseline 9/9: Pt is not yet meeting this goal due to being at 3/5 MMT or below for most shoulder movements.    Time 4    Period Weeks    Status On-going                      Plan - 05/29/21 1255     Clinical Impression Statement A: Pt was reassessed this date. Pt reported that he slept on L shoulder and that it was sore. Pt passive and active ROM measures stayed largely the same or decreased slightly from last assessment. When in supine pt P/ROM increased with reps of P/ROM. Pt FOTO cores also decreased to 42. Pt reports he still has trouble sleeping due to a mixture of pain and needing to urinate very frequently. See goal seciont for update on goals. Pt was interested in continued  therapy due to difficulty working on exercises at home as of late due to decreased endurance.    Occupational performance deficits (Please refer to evaluation for details): ADL's;IADL's;Rest and Sleep;Leisure    Body Structure / Function / Physical Skills ADL;Endurance;UE functional use;Fascial restriction;Pain;ROM;IADL;Strength    Comorbidities Affecting Occupational Performance: None    Modification or Assistance to Complete Evaluation  No modification of tasks or assist necessary to complete eval    OT Frequency 1x / week    OT Duration 4 weeks    OT Treatment/Interventions Self-care/ADL training;Ultrasound;DME and/or AE instruction;Patient/family education;Passive range of motion;Cryotherapy;Electrical Stimulation;Moist Heat;Therapeutic exercise;Manual Therapy;Therapeutic activities    Plan P: continue treatment once a week for 4 weeks. Add UBE bike treatment. P/ROM followed by active and shoulder girdle strengthening.    OT Home Exercise Plan 8/16: A/ROM    Consulted and Agree with Plan of Care Patient            No notes on file  Progress Note Reporting Period 04/30/21 to 05/29/2021  See note below for Objective Data and Assessment of Progress/Goals.           Visit Diagnosis: Stiffness of left shoulder, not elsewhere classified - Plan: Ot plan of care cert/re-cert  Other symptoms and signs involving the musculoskeletal system - Plan: Ot plan of care cert/re-cert  Chronic left shoulder pain - Plan: Ot plan of care cert/re-cert  Muscle weakness (generalized) - Plan: Ot plan of care cert/re-cert    Problem List Patient Active Problem List   Diagnosis Date Noted   Hypokalemia 05/18/2021   Muscular deconditioning 05/12/2021   Frequent falls 05/12/2021   Fatigue 04/29/2021   Recent unexplained weight loss 04/23/2021   Left shoulder pain 04/23/2021   Chest tightness 04/23/2021   Deep tissue injury 04/23/2021   Myoclonus 04/15/2021   Pneumonia 04/03/2021   PNA  (pneumonia) 04/02/2021   Abnormal finding on urinalysis 04/01/2021   Syncope 02/16/2021   Minor head injury    Right hip pain 02/11/2021   Erectile dysfunction 01/22/2021   Gait abnormality 09/30/2020   Mild non proliferative diabetic retinopathy ([place]) 07/16/2020   Need for immunization against influenza 06/03/2020   S/P left knee arthroscopy 05/06/20 05/13/2020   Abnormal MRI 03/26/2020   Joint disorder of knee 03/26/2020   Fall at home, initial encounter 03/13/2020   Anemia 09/03/2019   Reflux esophagitis 09/03/2019   Esophageal dysphagia 05/30/2019   Constipation 05/30/2019   Positive colorectal cancer screening using Cologuard  test 05/30/2019   Diabetic foot ulcer ([place]) 09/06/2018   Orthostasis 08/25/2016   Congestive heart failure ([place]) 05/17/2016   Bilateral carotid artery disease ([place]) 07/08/2014   Obstructive sleep apnea 02/12/2014   Restless legs 10/23/2013   Overweight with body mass index (BMI) of 29 to 29.9 in adult 10/23/2013   Hyperlipidemia 07/16/2013   Peripheral arterial disease ([place]) 07/16/2013   Hypertension 03/14/2012   Type 2 diabetes mellitus with circulatory disorder ([place]) 03/14/2012   Peripheral neuropathy 03/14/2012   Cardiomyopathy- EF NL 01/2013, now 30-35% echo 03/14/2012   Larey Seat OT, MOT   Larey Seat, OT/L 05/29/2021, 1:36 PM  [place], [place], [address] Phone: [phone]   Fax:  [phone]  Name: TEAGON KRON MRN: [id number] Date of Birth: 04-08-1946

## 2021-06-01 ENCOUNTER — Encounter (HOSPITAL_COMMUNITY): Payer: HMO

## 2021-06-03 ENCOUNTER — Encounter (HOSPITAL_COMMUNITY): Payer: HMO

## 2021-06-04 ENCOUNTER — Ambulatory Visit (HOSPITAL_COMMUNITY): Payer: HMO | Admitting: Physical Therapy

## 2021-06-04 ENCOUNTER — Other Ambulatory Visit: Payer: Self-pay

## 2021-06-04 ENCOUNTER — Encounter (HOSPITAL_COMMUNITY): Payer: Self-pay | Admitting: Physical Therapy

## 2021-06-04 DIAGNOSIS — R262 Difficulty in walking, not elsewhere classified: Secondary | ICD-10-CM

## 2021-06-04 DIAGNOSIS — R2681 Unsteadiness on feet: Secondary | ICD-10-CM

## 2021-06-04 DIAGNOSIS — E08621 Diabetes mellitus due to underlying condition with foot ulcer: Secondary | ICD-10-CM

## 2021-06-04 DIAGNOSIS — M6281 Muscle weakness (generalized): Secondary | ICD-10-CM

## 2021-06-04 NOTE — Therapy (Signed)
[place], [place], [address] Phone: [phone]   Fax:  [phone]  Wound Care Evaluation  Patient Details  Name: [name] MRN: [id number] Date of Birth: 1946-01-10 Referring Provider (PT): Noreene Larsson, NP   Encounter Date: 06/04/2021   PT End of Session - 06/04/21 1200     Visit Number 11    Number of Visits 26    Date for PT Re-Evaluation 07/02/21    Authorization Type [employer], no VL, no auth ,    Progress Note Due on Visit 20    PT Start Time 1048    PT Stop Time 1210    PT Time Calculation (min) 82 min    Equipment Utilized During Treatment --    Activity Tolerance Patient tolerated treatment well    Behavior During Therapy [facility] for tasks assessed/performed             Past Medical History:  Diagnosis Date   Aftercare following surgery of the circulatory system, NEC 12/04/2013   Angina decubitus ([place]) 05/19/2016   Arrhythmia 05/20/2016   Arthritis    ARTHRITIS, RIGHT FOOT 06/26/2008   Qualifier: Diagnosis of  By: Aline Brochure MD, [name]     Cardiomyopathy- EF NL 01/2013, now 30-35% echo 03/14/2012   Carotid artery occlusion    left s/p CEA   Cellulitis of left foot 05/07/2018   CHF (congestive heart failure) ([place])    Critical lower limb ischemia ([place]) 02/28/2014   Critical limb ischemia    Diabetes mellitus    Elevated troponin 04/11/2016   GERD (gastroesophageal reflux disease)    History of stroke June 2013 03/14/2012   HOH (hard of hearing)    Hypertension    Hypothyroidism    Nonhealing skin ulcer (DISH) 10/28/2014   Obstructive sleep apnea    Orthostatic hypotension    Osteomyelitis ([place])    left great toe   Pain in the chest 05/17/2016   Peripheral arterial disease ([place]), s/p PTA x 2 RLE    nonhealing ulcers bilaterally on each great toe   Pneumonia    PONV (postoperative nausea and vomiting)    Pulmonary nodule 05/17/2016   Restless leg syndrome    Shortness of breath     Stroke [facility]) March 08, 2012   Toe osteomyelitis, left [facility])    Ulcer of great toe, left, with necrosis of bone ([place])    Wears dentures     Past Surgical History:  Procedure Laterality Date   AMPUTATION TOE Left 07/25/2018   Procedure: AMPUTATION TOE INTERPHALANGEAL HALLUX LEFT;  Surgeon: Evelina Bucy, DPM;  Location: [facility];  Service: Podiatry;  Laterality: Left;   ANGIOPLASTY  02/28/14   diamond back orbital rotational atherectomy of Rt. tibial   BACK SURGERY     BONE BIOPSY Left 07/25/2018   Procedure: SUPERFICIAL BONE BIOPSY;  Surgeon: Evelina Bucy, DPM;  Location: [place];  Service: Podiatry;  Laterality: Left;   CARDIAC CATHETERIZATION N/A 05/19/2016   Procedure: Right/Left Heart Cath and Coronary Angiography;  Surgeon: Belva Crome, MD;  Location: [place];  Service: Cardiovascular;  Laterality: N/A;   CERVICAL FUSION     ENDARTERECTOMY Left 11/29/2013   Procedure: ENDARTERECTOMY CAROTID;  Surgeon: Serafina Mitchell, MD;  Location: [place];  Service: Vascular;  Laterality: Left;   ESOPHAGOGASTRODUODENOSCOPY  02/2010   Dr. Gala Romney: patient presented with food impaction, schatzki ring with superimposed component of stricture with erosive reflux  esophagitis, s/p disimpaction but dilation planned at later date    ESOPHAGOGASTRODUODENOSCOPY (EGD) WITH PROPOFOL N/A 08/13/2019   Dr. Gala Romney: Erosive reflux esophagitis with mild stricture and incidental Mallory-Weiss tear which precluded esophageal dilation.  Medium sized hiatal hernia.   ESOPHAGOGASTRODUODENOSCOPY (EGD) WITH PROPOFOL N/A 10/11/2019   Dr. Gala Romney: Esophageal stenosis status post dilation, moderate hiatal hernia   EYE SURGERY     FLEXIBLE SIGMOIDOSCOPY N/A 08/13/2019   Procedure: FLEXIBLE SIGMOIDOSCOPY;  Surgeon: [name], MD;  [facility];  Service: Endoscopy;  Laterality: N/A;  colonoscopy aboerted due to formed stool and poor prep   FOOT SURGERY     KNEE ARTHROSCOPY WITH MEDIAL MENISECTOMY Left  05/06/2020   Procedure: KNEE ARTHROSCOPY WITH MEDIAL MENISCECTOMY AND LATERAL MENISCECTOMY;  Surgeon: [name], MD;  Location: [facility];  Service: Orthopedics;  Laterality: Left;   Lower ext duplex doppler  03/14/14   Rt ABI 1.2   LOWER EXTREMITY ANGIOGRAM Bilateral 02/18/2014   Procedure: LOWER EXTREMITY ANGIOGRAM;  Surgeon: [name], MD;  Location: [facility] CATH LAB;  Service: Cardiovascular;  Laterality: Bilateral;   LOWER EXTREMITY ANGIOGRAM N/A 10/31/2014   Procedure: LOWER EXTREMITY ANGIOGRAM;  Surgeon: [name], MD;  Location: [facility] CATH LAB;  Service: Cardiovascular;  Laterality: N/A;   MALONEY DILATION N/A 10/11/2019   Procedure: Venia Minks DILATION;  Surgeon: [name], MD;  [facility];  Service: Endoscopy;  Laterality: N/A;   MULTIPLE TOOTH EXTRACTIONS     PV angiogram  02/18/2014   tibial vessel diseas bil.   SPINE SURGERY     tendon achillies lengthing and sesamoid      There were no vitals filed for this visit.   Subjective Assessment - 06/04/21 1139     Subjective Patient states no pain in knee today. Exercises are going alright for the knee but he hasnt been doing them as much as he needs because of lack of energy. He has been having more energy with change in vitamins. He has not done any wound care since MD visit. Was very painful but hasnt been as painful lately.    Pertinent History CHF , R foot wound, left knee scope, DB, Restless leg syndrome, hx of stroke.    Limitations House hold activities;Standing;Lifting    Currently in Pain? No/denies               Wound Therapy - 06/04/21 0001     Subjective Patient states wound was painful but has not been painful lately.    Patient and Family Stated Goals to have wound to heal and to be able to walk    Date of Onset --   approx 4-5 years ago   Pain Score 0-No pain    Wound Properties Date First Assessed: 10/20/20 Time First Assessed: 1000 Wound Type: Diabetic ulcer Location: Foot Location  Orientation: Anterior;Right Wound Description (Comments): bottom of foot Present on Admission: Yes   Wound Image Images linked: 1    Dressing Type Tape dressing    Dressing Changed Changed    Dressing Status Intact    Dressing Change Frequency PRN    Site / Wound Assessment --   callus   Drainage Amount None    Treatment Cleansed;Debridement (Selective)    Selective Debridement - Location callus plantar met head region    Selective Debridement - Tools Used Forceps;Scalpel    Selective Debridement - Tissue Removed callous devitalized tissue.    Wound Therapy - Clinical Statement  see assessment section    Wound Therapy - Functional Problem List difficulty walking, washing, dressing    Factors Delaying/Impairing Wound Healing Diabetes Mellitus;Immobility    Hydrotherapy Plan Debridement;Dressing change;Electrical stimulation;Patient/family education;Pulsatile lavage with suction    Wound Therapy - Frequency 2X / week    Wound Therapy - Current Recommendations PT    Wound Plan assess if wound present, continue wound care with appropriate dressings.    Dressing  medihoney 2x2, medipore tape                   Objective measurements completed on examination: See above findings.     [place] Adult PT Treatment/Exercise - 06/04/21 0001       Knee/Hip Exercises: Seated   Long Arc Quad Both;1 set;10 reps    Long [employer] Limitations 5 second holds    Sit to [employer] 2 sets;10 reps;without UE support      Knee/Hip Exercises: Supine   Bridges Both;2 sets;10 reps    Straight Leg Raises 2 sets;10 reps;Both;Strengthening    Other Supine Knee/Hip Exercises hamstring curls with green ball 2x 10 with 5 second holds      Knee/Hip Exercises: Prone   Other Prone Exercises heelsqueezes 5x 10 second holds, 2 sets                    PT Education - 06/04/21 1142     Education Details Changing dressing if wet/soiled, continuing HEP    Person(s) Educated Patient    Methods Explanation     Comprehension Verbalized understanding              PT Short Term Goals - 05/21/21 1501       PT SHORT TERM GOAL #1   Title Patient will be independent in self management strategies to improve quality of life and functional outcomes.    Baseline Reports variable compliance    Time 3    Period Weeks    Status Partially Met    Target Date 04/27/21      PT SHORT TERM GOAL #2   Title Patient will be able to report at least 25% improvement in overall mobility.    Baseline Reports 50% improved    Time 3    Period Weeks    Status Achieved    Target Date 04/27/21      PT SHORT TERM GOAL #3   Title Demo improved balance and BLE strength as evidenced by time of 18 sec 5xSTS test    Baseline 13 seconds with use of UES, but uses heavy momentum use of UEs and does not fully extend knees    Time 3    Period Weeks    Status Partially Met    Target Date 04/27/21               PT Long Term Goals - 06/04/21 1202       PT LONG TERM GOAL #1   Title Patient will improve FOTO score by at least 10 points in order to indicate improved tolerance to activity.    Baseline 24% decrease    Time 6    Period Weeks    Status On-going      PT LONG TERM GOAL #2   Title Demo improved gait velocity and safety as evidenced by distance of 150 ft during 2MWT    Baseline 60 ft with rollator    Time 6    Period Weeks    Status  On-going      PT LONG TERM GOAL #3   Title Patient will not have foot wound present to reduce risk of infection.    Time 4    Period Weeks    Target Date 07/02/21      PT LONG TERM GOAL #4   Title Patient will verbalize proper foot care techniques.    Time 4    Period Weeks    Status New    Target Date 07/02/21                  Plan - 06/04/21 1158     Clinical Impression Statement Patient is a 75 y.o. male who presents to physical therapy with referral for a diabetic ulcer of R midfoot. Patient has been treated for wound in clinic before what was  present upon last discharge. Wound appears to have been slow healing. Patient states continued wound last time he went to MD. Patient with extensive callus in area of where wound was with some dried blood in area in callus. Debrided callus extensively without reveal of wound. Dressed wound with medihoney to soften remainder of callus and will assess next time if wound present deep to callus of if wound has healed.  Patient presently being seen for ortho treatment as well. Evaluated wound today and will add to current POC for management. Will extend POC 2x/week for 4 weeks for wound and knee pain. Goals added to POC for wound. Patient will benefit from PT to promote wound healing. Patient showing improving activity tolerance today and is able to complete exercises with reduced fatigue compared to prior sessions.    Personal Factors and Comorbidities Comorbidity 1;Comorbidity 3+;Comorbidity 2    Comorbidities CHF, left knee surgery, right chronic foot wound, DB, restless leg syndromes    Examination-Activity Limitations Bathing;Lift;Locomotion Level;Transfers;Stand;Stairs;Squat;Reach Overhead;Carry    Examination-Participation Restrictions Community Activity;Meal Prep    Stability/Clinical Decision Making Evolving/Moderate complexity    Rehab Potential Fair    PT Frequency 2x / week    PT Duration 4 weeks    PT Treatment/Interventions ADLs/Self Care Home Management;Other (comment);Moist Heat;Balance training;Therapeutic exercise;Therapeutic activities;Functional mobility training;Stair training;Gait training;DME Instruction;Ultrasound;Neuromuscular re-education;Patient/family education;Orthotic Fit/Training;Manual techniques;Taping;Passive range of motion;Electrical Stimulation    PT Next Visit Plan Continue LE strength, gait and balance as tolerated. Wound care if wound deep to callus.    PT Home Exercise Plan hip abd/add isometric, QS, hamstring stretch;  04/15/21: Isometric abd, abd supine, SAQ, bridge  9/1 clamshell, sideyling hip abduciton    Consulted and Agree with Plan of Care Patient             Patient will benefit from skilled therapeutic intervention in order to improve the following deficits and impairments:  Abnormal gait, Decreased endurance, Decreased skin integrity, Increased edema, Decreased activity tolerance, Decreased balance, Decreased mobility, Decreased knowledge of use of DME, Decreased strength, Difficulty walking, Pain, Decreased range of motion, Impaired flexibility, Postural dysfunction, Improper body mechanics  Visit Diagnosis: Muscle weakness (generalized)  Difficulty in walking, not elsewhere classified  Unsteadiness on feet  Diabetic ulcer of right midfoot associated with diabetes mellitus due to underlying condition, limited to breakdown of skin [facility])    Problem List Patient Active Problem List   Diagnosis Date Noted   Hypokalemia 05/18/2021   Muscular deconditioning 05/12/2021   Frequent falls 05/12/2021   Fatigue 04/29/2021   Recent unexplained weight loss 04/23/2021   Left shoulder pain 04/23/2021   Chest tightness 04/23/2021   Deep tissue injury 04/23/2021  Myoclonus 04/15/2021   Pneumonia 04/03/2021   PNA (pneumonia) 04/02/2021   Abnormal finding on urinalysis 04/01/2021   Syncope 02/16/2021   Minor head injury    Right hip pain 02/11/2021   Erectile dysfunction 01/22/2021   Gait abnormality 09/30/2020   Mild non proliferative diabetic retinopathy ([place]) 07/16/2020   Need for immunization against influenza 06/03/2020   S/P left knee arthroscopy 05/06/20 05/13/2020   Abnormal MRI 03/26/2020   Joint disorder of knee 03/26/2020   Fall at home, initial encounter 03/13/2020   Anemia 09/03/2019   Reflux esophagitis 09/03/2019   Esophageal dysphagia 05/30/2019   Constipation 05/30/2019   Positive colorectal cancer screening using Cologuard test 05/30/2019   Diabetic foot ulcer ([place]) 09/06/2018   Orthostasis 08/25/2016   Congestive  heart failure ([place]) 05/17/2016   Bilateral carotid artery disease ([place]) 07/08/2014   Obstructive sleep apnea 02/12/2014   Restless legs 10/23/2013   Overweight with body mass index (BMI) of 29 to 29.9 in adult 10/23/2013   Hyperlipidemia 07/16/2013   Peripheral arterial disease ([place]) 07/16/2013   Hypertension 03/14/2012   Type 2 diabetes mellitus with circulatory disorder ([place]) 03/14/2012   Peripheral neuropathy 03/14/2012   Cardiomyopathy- EF NL 01/2013, now 30-35% echo 03/14/2012    12:18 PM, 06/04/21 Mearl Latin PT, DPT Physical Therapist at [place], [place], [address] Phone: [phone]   Fax:  [phone]  Name: [name] MRN: [id number] Date of Birth: [birth year]

## 2021-06-05 ENCOUNTER — Ambulatory Visit (HOSPITAL_COMMUNITY): Payer: HMO | Admitting: Occupational Therapy

## 2021-06-05 ENCOUNTER — Encounter (HOSPITAL_COMMUNITY): Payer: HMO | Admitting: Physical Therapy

## 2021-06-05 ENCOUNTER — Encounter (HOSPITAL_COMMUNITY): Payer: Self-pay | Admitting: Occupational Therapy

## 2021-06-05 DIAGNOSIS — M25612 Stiffness of left shoulder, not elsewhere classified: Secondary | ICD-10-CM

## 2021-06-05 DIAGNOSIS — G8929 Other chronic pain: Secondary | ICD-10-CM

## 2021-06-05 DIAGNOSIS — R29898 Other symptoms and signs involving the musculoskeletal system: Secondary | ICD-10-CM

## 2021-06-05 DIAGNOSIS — M6281 Muscle weakness (generalized): Secondary | ICD-10-CM | POA: Diagnosis not present

## 2021-06-05 DIAGNOSIS — M25512 Pain in left shoulder: Secondary | ICD-10-CM

## 2021-06-05 NOTE — Therapy (Signed)
[facility] [address] [place], [place], [address] Phone: [phone]   Fax:  [phone]  Occupational Therapy Treatment  Patient Details  Name: [name] MRN: [id number] Date of Birth: 10-06-1945 Referring Provider (OT): Demetrius Revel, NP   Encounter Date: 06/05/2021   OT End of Session - 06/05/21 1344     Visit Number 7    Number of Visits 10    Date for OT Re-Evaluation 06/26/21    Authorization Type [employer]; $30 copay    Authorization Time Period no visit limit    Progress Note Due on Visit 10    OT Start Time 1301    OT Stop Time 1343    OT Time Calculation (min) 42 min    Activity Tolerance Patient tolerated treatment well    Behavior During Therapy [facility] for tasks assessed/performed             Past Medical History:  Diagnosis Date   Aftercare following surgery of the circulatory system, NEC 12/04/2013   Angina decubitus ([place]) 05/19/2016   Arrhythmia 05/20/2016   Arthritis    ARTHRITIS, RIGHT FOOT 06/26/2008   Qualifier: Diagnosis of  By: Aline Brochure MD, [name]     Cardiomyopathy- EF NL 01/2013, now 30-35% echo 03/14/2012   Carotid artery occlusion    left s/p CEA   Cellulitis of left foot 05/07/2018   CHF (congestive heart failure) ([place])    Critical lower limb ischemia ([place]) 02/28/2014   Critical limb ischemia    Diabetes mellitus    Elevated troponin 04/11/2016   GERD (gastroesophageal reflux disease)    History of stroke June 2013 03/14/2012   HOH (hard of hearing)    Hypertension    Hypothyroidism    Nonhealing skin ulcer ([place]) 10/28/2014   Obstructive sleep apnea    Orthostatic hypotension    Osteomyelitis ([place])    left great toe   Pain in the chest 05/17/2016   Peripheral arterial disease ([place]), s/p PTA x 2 RLE    nonhealing ulcers bilaterally on each great toe   Pneumonia    PONV (postoperative nausea and vomiting)    Pulmonary nodule 05/17/2016   Restless leg syndrome    Shortness of breath     Stroke [facility]) March 08, 2012   Toe osteomyelitis, left [facility])    Ulcer of great toe, left, with necrosis of bone ([place])    Wears dentures     Past Surgical History:  Procedure Laterality Date   AMPUTATION TOE Left 07/25/2018   Procedure: AMPUTATION TOE INTERPHALANGEAL HALLUX LEFT;  Surgeon: Evelina Bucy, DPM;  Location: [place];  Service: Podiatry;  Laterality: Left;   ANGIOPLASTY  02/28/14   diamond back orbital rotational atherectomy of Rt. tibial   BACK SURGERY     BONE BIOPSY Left 07/25/2018   Procedure: SUPERFICIAL BONE BIOPSY;  Surgeon: Evelina Bucy, DPM;  Location: [place];  Service: Podiatry;  Laterality: Left;   CARDIAC CATHETERIZATION N/A 05/19/2016   Procedure: Right/Left Heart Cath and Coronary Angiography;  Surgeon: Belva Crome, MD;  Location: [place] CV LAB;  Service: Cardiovascular;  Laterality: N/A;   CERVICAL FUSION     ENDARTERECTOMY Left 11/29/2013   Procedure: ENDARTERECTOMY CAROTID;  Surgeon: Serafina Mitchell, MD;  Location: [place];  Service: Vascular;  Laterality: Left;   ESOPHAGOGASTRODUODENOSCOPY  02/2010   Dr. Gala Romney: patient presented with food impaction, schatzki ring with superimposed component of stricture with erosive reflux esophagitis, s/p disimpaction  but dilation planned at later date    ESOPHAGOGASTRODUODENOSCOPY (EGD) WITH PROPOFOL N/A 08/13/2019   Dr. Gala Romney: Erosive reflux esophagitis with mild stricture and incidental Mallory-Weiss tear which precluded esophageal dilation.  Medium sized hiatal hernia.   ESOPHAGOGASTRODUODENOSCOPY (EGD) WITH PROPOFOL N/A 10/11/2019   Dr. Gala Romney: Esophageal stenosis status post dilation, moderate hiatal hernia   EYE SURGERY     FLEXIBLE SIGMOIDOSCOPY N/A 08/13/2019   Procedure: FLEXIBLE SIGMOIDOSCOPY;  Surgeon: [name], MD;  [facility];  Service: Endoscopy;  Laterality: N/A;  colonoscopy aboerted due to formed stool and poor prep   FOOT SURGERY     KNEE ARTHROSCOPY WITH MEDIAL MENISECTOMY Left  05/06/2020   Procedure: KNEE ARTHROSCOPY WITH MEDIAL MENISCECTOMY AND LATERAL MENISCECTOMY;  Surgeon: [name], MD;  Location: [facility];  Service: Orthopedics;  Laterality: Left;   Lower ext duplex doppler  03/14/14   Rt ABI 1.2   LOWER EXTREMITY ANGIOGRAM Bilateral 02/18/2014   Procedure: LOWER EXTREMITY ANGIOGRAM;  Surgeon: [name], MD;  Location[facility] CATH LAB;  Service: Cardiovascular;  Laterality: Bilateral;   LOWER EXTREMITY ANGIOGRAM N/A 10/31/2014   Procedure: LOWER EXTREMITY ANGIOGRAM;  Surgeon: [name], MD;  Location: [facility] CATH LAB;  Service: Cardiovascular;  Laterality: N/A;   MALONEY DILATION N/A 10/11/2019   Procedure: Venia Minks DILATION;  Surgeon: [name], MD;  [facility];  Service: Endoscopy;  Laterality: N/A;   MULTIPLE TOOTH EXTRACTIONS     PV angiogram  02/18/2014   tibial vessel diseas bil.   SPINE SURGERY     tendon achillies lengthing and sesamoid      There were no vitals filed for this visit.   Subjective Assessment - 06/05/21 1259     Subjective  S: I must have slept on it because it was hurting bad when I woke up this morning.    Currently in Pain? No/denies                [facility] OT Assessment - 06/05/21 1259       Assessment   Medical Diagnosis Chronic left shoulder pain      Precautions   Precautions Fall                      OT Treatments/Exercises (OP) - 06/05/21 1304       Exercises   Exercises Shoulder      Shoulder Exercises: Supine   Protraction PROM;5 reps;AROM;10 reps    Horizontal ABduction PROM;5 reps;AROM;10 reps    External Rotation PROM;5 reps;AROM;10 reps    Internal Rotation PROM;5 reps;AROM;10 reps    Flexion PROM;5 reps;AROM;10 reps    ABduction PROM;5 reps;AROM;10 reps      Shoulder Exercises: Seated   Extension Theraband;10 reps    Theraband Level (Shoulder Extension) Level 2 (Red)    Retraction Theraband;10 reps    Theraband Level (Shoulder Retraction) Level 2 (Red)     Row Theraband;10 reps    Theraband Level (Shoulder Row) Level 2 (Red)    Protraction AROM;10 reps    Horizontal ABduction AROM;10 reps    External Rotation AROM;10 reps    Internal Rotation AROM;10 reps    Flexion AROM;10 reps;Limitations    Abduction AROM;10 reps;Limitations      Shoulder Exercises: ROM/Strengthening   Proximal Shoulder Strengthening, Supine 10X each, no rest breaks    Proximal Shoulder Strengthening, Seated 10X each, no rest breaks  OT Education - 06/05/21 1323     Education Details Provided additional copy of shoulder A/ROM exercises    Person(s) Educated Patient    Methods Explanation;Demonstration;Handout    Comprehension Verbalized understanding;Returned demonstration              OT Short Term Goals - 05/29/21 1322       OT SHORT TERM GOAL #1   Title Pt will be provided with and educated on HEP to improve mobility of LUE required during ADL completion.    Baseline 9/9: Pt will need continued education of tasks to increase L UE function. Pt reports moderate difficult with ADL tasks due to L UE.    Time 4    Period Weeks    Status On-going    Target Date 06/26/21      OT SHORT TERM GOAL #2   Title Pt will decrease pain in LUE to 3/10 or less to improve ability to sleep for 3+ hours without waking due to pain.    Baseline 9/9: Pt does reported 3/10 pain but is not able to consistently sleep 3+ hours without waking due to pain. Pt reports that need to urnate due to water pills is also a contributing factor.    Time 4    Period Weeks    Status Partially Met      OT SHORT TERM GOAL #3   Title Pt will decrease LUE fascial restrictions to min amounts or less to improve ability to complete functional reaching tasks.    Baseline 9/9: Pt reports continued deficits in functional reaching.    Time 4    Period Weeks    Status On-going      OT SHORT TERM GOAL #4   Title Pt will increase LUE A/ROM to [facility] to improve ability  to reach for clothes or items in closet.    Baseline 9/9: 9/9: Pt reports continued deficits in functional reaching overhead.    Time 4    Period Weeks    Status On-going      OT SHORT TERM GOAL #5   Title Pt will increase LUE strength to 4/5 or greater to improve ability to lift cast iron skillet during meal prep tasks.    Baseline 9/9: Pt is not yet meeting this goal due to being at 3/5 MMT or below for most shoulder movements.    Time 4    Period Weeks    Status On-going                      Plan - 06/05/21 1313     Clinical Impression Statement A: Pt reports he has not been completing his HEP but he is going to start doing it because his energy is returning. Completed P/ROM to LUE as well as A/ROM in supine, pt with crepitus during horizontal abduction. Completed A/ROM and scapular theraband in sitting, UBE. Verbal cuing for form and technique.    Body Structure / Function / Physical Skills ADL;Endurance;UE functional use;Fascial restriction;Pain;ROM;IADL;Strength    Plan P: Continue with A/ROM and functional reaching, trial therapy ball strengthening with volleyball    OT Home Exercise Plan 8/16: A/ROM    Consulted and Agree with Plan of Care Patient             Patient will benefit from skilled therapeutic intervention in order to improve the following deficits and impairments:   Body Structure / Function / Physical Skills: ADL, Endurance, UE functional use, Fascial  restriction, Pain, ROM, IADL, Strength       Visit Diagnosis: Stiffness of left shoulder, not elsewhere classified  Other symptoms and signs involving the musculoskeletal system  Chronic left shoulder pain    Problem List Patient Active Problem List   Diagnosis Date Noted   Hypokalemia 05/18/2021   Muscular deconditioning 05/12/2021   Frequent falls 05/12/2021   Fatigue 04/29/2021   Recent unexplained weight loss 04/23/2021   Left shoulder pain 04/23/2021   Chest tightness 04/23/2021    Deep tissue injury 04/23/2021   Myoclonus 04/15/2021   Pneumonia 04/03/2021   PNA (pneumonia) 04/02/2021   Abnormal finding on urinalysis 04/01/2021   Syncope 02/16/2021   Minor head injury    Right hip pain 02/11/2021   Erectile dysfunction 01/22/2021   Gait abnormality 09/30/2020   Mild non proliferative diabetic retinopathy ([place]) 07/16/2020   Need for immunization against influenza 06/03/2020   S/P left knee arthroscopy 05/06/20 05/13/2020   Abnormal MRI 03/26/2020   Joint disorder of knee 03/26/2020   Fall at home, initial encounter 03/13/2020   Anemia 09/03/2019   Reflux esophagitis 09/03/2019   Esophageal dysphagia 05/30/2019   Constipation 05/30/2019   Positive colorectal cancer screening using Cologuard test 05/30/2019   Diabetic foot ulcer ([place]) 09/06/2018   Orthostasis 08/25/2016   Congestive heart failure ([place]) 05/17/2016   Bilateral carotid artery disease ([place]) 07/08/2014   Obstructive sleep apnea 02/12/2014   Restless legs 10/23/2013   Overweight with body mass index (BMI) of 29 to 29.9 in adult 10/23/2013   Hyperlipidemia 07/16/2013   Peripheral arterial disease ([place]) 07/16/2013   Hypertension 03/14/2012   Type 2 diabetes mellitus with circulatory disorder ([place]) 03/14/2012   Peripheral neuropathy 03/14/2012   Cardiomyopathy- EF NL 01/2013, now 30-35% echo 03/14/2012    Guadelupe Sabin, OTR/L  [phone] 06/05/2021, 1:45 PM  [facility] [address] [place], [place], [address] Phone: [phone]   Fax:  [phone]  Name: [name] MRN: [id number] Date of Birth: 1946-03-28

## 2021-06-05 NOTE — Patient Instructions (Signed)

## 2021-06-08 ENCOUNTER — Ambulatory Visit (HOSPITAL_COMMUNITY): Payer: HMO | Admitting: Physical Therapy

## 2021-06-08 ENCOUNTER — Other Ambulatory Visit: Payer: Self-pay

## 2021-06-08 DIAGNOSIS — M6281 Muscle weakness (generalized): Secondary | ICD-10-CM | POA: Diagnosis not present

## 2021-06-08 DIAGNOSIS — R262 Difficulty in walking, not elsewhere classified: Secondary | ICD-10-CM

## 2021-06-08 NOTE — Therapy (Signed)
[place], [place], [address] Phone: [phone]   Fax:  [phone]  Wound Care Therapy  Patient Details  Name: [name] MRN: [id number] Date of Birth: November 19, 1945 Referring Provider (PT): Noreene Larsson, NP   Encounter Date: 06/08/2021   PT End of Session - 06/08/21 1201     Visit Number 12    Number of Visits 26    Date for PT Re-Evaluation 07/02/21    Authorization Type [employer], no VL, no auth ,    Progress Note Due on Visit 20    PT Start Time 1053    PT Stop Time 9163    PT Time Calculation (min) 60 min    Activity Tolerance Patient tolerated treatment well    Behavior During Therapy [facility] for tasks assessed/performed             Past Medical History:  Diagnosis Date   Aftercare following surgery of the circulatory system, NEC 12/04/2013   Angina decubitus ([place]) 05/19/2016   Arrhythmia 05/20/2016   Arthritis    ARTHRITIS, RIGHT FOOT 06/26/2008   Qualifier: Diagnosis of  By: Aline Brochure MD, [name]     Cardiomyopathy- EF NL 01/2013, now 30-35% echo 03/14/2012   Carotid artery occlusion    left s/p CEA   Cellulitis of left foot 05/07/2018   CHF (congestive heart failure) ([place])    Critical lower limb ischemia ([place]) 02/28/2014   Critical limb ischemia    Diabetes mellitus    Elevated troponin 04/11/2016   GERD (gastroesophageal reflux disease)    History of stroke June 2013 03/14/2012   HOH (hard of hearing)    Hypertension    Hypothyroidism    Nonhealing skin ulcer ([place]) 10/28/2014   Obstructive sleep apnea    Orthostatic hypotension    Osteomyelitis ([place])    left great toe   Pain in the chest 05/17/2016   Peripheral arterial disease ([place]), s/p PTA x 2 RLE    nonhealing ulcers bilaterally on each great toe   Pneumonia    PONV (postoperative nausea and vomiting)    Pulmonary nodule 05/17/2016   Restless leg syndrome    Shortness of breath    Stroke [facility]) March 08, 2012   Toe  osteomyelitis, left [facility])    Ulcer of great toe, left, with necrosis of bone ([place])    Wears dentures     Past Surgical History:  Procedure Laterality Date   AMPUTATION TOE Left 07/25/2018   Procedure: AMPUTATION TOE INTERPHALANGEAL HALLUX LEFT;  Surgeon: Evelina Bucy, DPM;  Location: [place];  Service: Podiatry;  Laterality: Left;   ANGIOPLASTY  02/28/14   diamond back orbital rotational atherectomy of Rt. tibial   BACK SURGERY     BONE BIOPSY Left 07/25/2018   Procedure: SUPERFICIAL BONE BIOPSY;  Surgeon: Evelina Bucy, DPM;  Location: [place];  Service: Podiatry;  Laterality: Left;   CARDIAC CATHETERIZATION N/A 05/19/2016   Procedure: Right/Left Heart Cath and Coronary Angiography;  Surgeon: Belva Crome, MD;  Location: [place] CV LAB;  Service: Cardiovascular;  Laterality: N/A;   CERVICAL FUSION     ENDARTERECTOMY Left 11/29/2013   Procedure: ENDARTERECTOMY CAROTID;  Surgeon: Serafina Mitchell, MD;  Location: [place];  Service: Vascular;  Laterality: Left;   ESOPHAGOGASTRODUODENOSCOPY  02/2010   Dr. Gala Romney: patient presented with food impaction, schatzki ring with superimposed component of stricture with erosive reflux esophagitis, s/p disimpaction but dilation planned at later  date    ESOPHAGOGASTRODUODENOSCOPY (EGD) WITH PROPOFOL N/A 08/13/2019   Dr. Gala Romney: Erosive reflux esophagitis with mild stricture and incidental Mallory-Weiss tear which precluded esophageal dilation.  Medium sized hiatal hernia.   ESOPHAGOGASTRODUODENOSCOPY (EGD) WITH PROPOFOL N/A 10/11/2019   Dr. Gala Romney: Esophageal stenosis status post dilation, moderate hiatal hernia   EYE SURGERY     FLEXIBLE SIGMOIDOSCOPY N/A 08/13/2019   Procedure: FLEXIBLE SIGMOIDOSCOPY;  Surgeon: [name], MD;  [facility];  Service: Endoscopy;  Laterality: N/A;  colonoscopy aboerted due to formed stool and poor prep   FOOT SURGERY     KNEE ARTHROSCOPY WITH MEDIAL MENISECTOMY Left 05/06/2020   Procedure: KNEE ARTHROSCOPY  WITH MEDIAL MENISCECTOMY AND LATERAL MENISCECTOMY;  Surgeon: [name], MD;  Location: [facility];  Service: Orthopedics;  Laterality: Left;   Lower ext duplex doppler  03/14/14   Rt ABI 1.2   LOWER EXTREMITY ANGIOGRAM Bilateral 02/18/2014   Procedure: LOWER EXTREMITY ANGIOGRAM;  Surgeon: [name], MD;  Location: [facility] CATH LAB;  Service: Cardiovascular;  Laterality: Bilateral;   LOWER EXTREMITY ANGIOGRAM N/A 10/31/2014   Procedure: LOWER EXTREMITY ANGIOGRAM;  Surgeon: [name], MD;  Location: [facility] CATH LAB;  Service: Cardiovascular;  Laterality: N/A;   MALONEY DILATION N/A 10/11/2019   Procedure: Venia Minks DILATION;  Surgeon: [name], MD;  [facility];  Service: Endoscopy;  Laterality: N/A;   MULTIPLE TOOTH EXTRACTIONS     PV angiogram  02/18/2014   tibial vessel diseas bil.   SPINE SURGERY     tendon achillies lengthing and sesamoid      There were no vitals filed for this visit.    Subjective Assessment - 06/08/21 1111     Subjective Pt states that he joined the [facility] and is going to be going there on the days that he is not in clinic.  He has no questions on his HEP    Patient Stated Goals be able to walk normally and function correctly.    Currently in Pain? No/denies              [facility] PT Assessment - 06/08/21 0001       Assessment   Medical Diagnosis R foot ulcer and gait instability    Referring Provider (PT) Noreene Larsson, NP      Home Environment   Living Environment Private residence    Living Arrangements Spouse/significant other    Available Help at Discharge Family    Type of [place] to enter    Entrance Stairs-Number of Steps 2    Entrance Stairs-Rails None    Home Layout Multi-level    Alternate Level Stairs-Number of Steps 11    Alternate Level Stairs-Rails Right    [employer] - 4 wheels      Prior Function   Level of Independence Independent with household mobility with  device;Independent with transfers    Vocation Retired      Functional Tests   Functional tests Sit to Stand      Sit to Stand   Comments 6 in 30 seconds      Strength   Strength Assessment Site Ankle    Right Hip Flexion 5/5   was 3/5   Right Hip Extension 3/5   was 2+   Right Hip ABduction 3/5   was 3-   Right Hip ADduction 3/5    Left Hip Flexion 5/5   was 3/5  Left Hip Extension 2+/5   was 2+   Left Hip ABduction 3-/5   was 3-   Left Hip ADduction 3/5    Right Knee Flexion 3/5    Right Knee Extension 5/5    Left Knee Flexion 3/5    Left Knee Extension 5/5   was 3/5   Right/Left Ankle Right;Left    Right Ankle Dorsiflexion 4+/5    Right Ankle Plantar Flexion 3-/5    Left Ankle Dorsiflexion 5/5    Left Ankle Plantar Flexion 3+/5      Flexibility   Soft Tissue Assessment /Muscle Length yes    Hamstrings Rt: 50 deg, Lt: 45 deg, lacking       Ambulation/Gait   Ambulation/Gait Yes    Ambulation Distance (Feet) 65 Feet    Assistive device Rollator    Gait Comments 2MWT                     Wound Therapy - 06/08/21 0001     Subjective Pt states that he has no pain    Wound Properties Date First Assessed: 10/20/20 Time First Assessed: 1000 Wound Type: Diabetic ulcer Location: Foot Location Orientation: Anterior;Right Wound Description (Comments): bottom of foot Present on Admission: Yes Final Assessment Date: 06/08/21 Final Assessment Time: 1150   Treatment Cleansed    Wound Therapy - Clinical Statement see assessment section    Wound Plan slight callous but no wound.  Educated pt that he would benefit from moisturizing area and applyin rounded out mole skin to decrease pressure.  There is not skilled therapy needed at this time and pt will be discharged from wound care.             [place] Adult PT Treatment/Exercise - 06/08/21 0001       Transfers   Transfers --    Sit to Stand --    Five time sit to stand comments  --      Ambulation/Gait    Ambulation/Gait Assistance --    Gait Pattern --    Gait velocity --    Stairs --    Stairs Assistance --    Stair Management Technique --    Number of Stairs --    Height of Stairs --      Exercises   Exercises Knee/Hip      Knee/Hip Exercises: Standing   Other Standing Knee Exercises side stepping x 2 RT      Knee/Hip Exercises: Seated   Sit to Sand 10 reps      Knee/Hip Exercises: Supine   Bridges 15 reps      Knee/Hip Exercises: Sidelying   Hip ABduction Both;10 reps      Knee/Hip Exercises: Prone   Hip Extension Both;10 reps   Lt with knee bent to 90 degrees                   PT Education - 06/08/21 1201     Education Details keep moisturizing callous area on plantar aspect of his foot as well as moisturizing    Person(s) Educated Patient    Comprehension Verbalized understanding              PT Short Term Goals - 05/21/21 1501       PT SHORT TERM GOAL #1   Title Patient will be independent in self management strategies to improve quality of life and functional outcomes.    Baseline Reports variable compliance    Time  3    Period Weeks    Status Partially Met    Target Date 04/27/21      PT SHORT TERM GOAL #2   Title Patient will be able to report at least 25% improvement in overall mobility.    Baseline Reports 50% improved    Time 3    Period Weeks    Status Achieved    Target Date 04/27/21      PT SHORT TERM GOAL #3   Title Demo improved balance and BLE strength as evidenced by time of 18 sec 5xSTS test    Baseline 13 seconds with use of UES, but uses heavy momentum use of UEs and does not fully extend knees    Time 3    Period Weeks    Status Partially Met    Target Date 04/27/21               PT Long Term Goals - 06/04/21 1202       PT LONG TERM GOAL #1   Title Patient will improve FOTO score by at least 10 points in order to indicate improved tolerance to activity.    Baseline 24% decrease    Time 6    Period Weeks     Status On-going      PT LONG TERM GOAL #2   Title Demo improved gait velocity and safety as evidenced by distance of 150 ft during 2MWT    Baseline 60 ft with rollator    Time 6    Period Weeks    Status On-going      PT LONG TERM GOAL #3   Title Patient will not have foot wound present to reduce risk of infection.    Time 4    Period Weeks    Target Date 07/02/21      PT LONG TERM GOAL #4   Title Patient will verbalize proper foot care techniques.    Time 4    Period Weeks    Status New    Target Date 07/02/21                   Plan - 06/08/21 1202     Clinical Impression Statement Callous area on Rt plantar aspect of foot assessed.  There is no wound here.  Pt instructed in care and will continue home care.  Therapist reassessed pt strength with all mm groups improving to normal or near normal except for B gluteus medius and maximus, both of which continue to be significantly weakened.  Therapist recomended sit to stand every 2 hours, supine hip abduction and bridging.    Personal Factors and Comorbidities Comorbidity 1;Comorbidity 3+;Comorbidity 2    Comorbidities CHF, left knee surgery, right chronic foot wound, DB, restless leg syndromes    Examination-Activity Limitations Bathing;Lift;Locomotion Level;Transfers;Stand;Stairs;Squat;Reach Overhead;Carry    Examination-Participation Restrictions Community Activity;Meal Prep    Stability/Clinical Decision Making Evolving/Moderate complexity    Rehab Potential Fair    PT Frequency 2x / week    PT Duration 4 weeks    PT Treatment/Interventions ADLs/Self Care Home Management;Other (comment);Moist Heat;Balance training;Therapeutic exercise;Therapeutic activities;Functional mobility training;Stair training;Gait training;DME Instruction;Ultrasound;Neuromuscular re-education;Patient/family education;Orthotic Fit/Training;Manual techniques;Taping;Passive range of motion;Electrical Stimulation    PT Next Visit Plan Continue  LE strength, gait and balance as tolerated.    PT Home Exercise Plan hip abd/add isometric, QS, hamstring stretch;  04/15/21: Isometric abd, abd supine, SAQ, bridge 9/1 clamshell, sideyling hip abduciton    Consulted and Agree with Plan  of Care Patient             Patient will benefit from skilled therapeutic intervention in order to improve the following deficits and impairments:  Abnormal gait, Decreased endurance, Decreased skin integrity, Increased edema, Decreased activity tolerance, Decreased balance, Decreased mobility, Decreased knowledge of use of DME, Decreased strength, Difficulty walking, Pain, Decreased range of motion, Impaired flexibility, Postural dysfunction, Improper body mechanics  Visit Diagnosis: Muscle weakness (generalized)  Difficulty in walking, not elsewhere classified     Problem List Patient Active Problem List   Diagnosis Date Noted   Hypokalemia 05/18/2021   Muscular deconditioning 05/12/2021   Frequent falls 05/12/2021   Fatigue 04/29/2021   Recent unexplained weight loss 04/23/2021   Left shoulder pain 04/23/2021   Chest tightness 04/23/2021   Deep tissue injury 04/23/2021   Myoclonus 04/15/2021   Pneumonia 04/03/2021   PNA (pneumonia) 04/02/2021   Abnormal finding on urinalysis 04/01/2021   Syncope 02/16/2021   Minor head injury    Right hip pain 02/11/2021   Erectile dysfunction 01/22/2021   Gait abnormality 09/30/2020   Mild non proliferative diabetic retinopathy ([place]) 07/16/2020   Need for immunization against influenza 06/03/2020   S/P left knee arthroscopy 05/06/20 05/13/2020   Abnormal MRI 03/26/2020   Joint disorder of knee 03/26/2020   Fall at home, initial encounter 03/13/2020   Anemia 09/03/2019   Reflux esophagitis 09/03/2019   Esophageal dysphagia 05/30/2019   Constipation 05/30/2019   Positive colorectal cancer screening using Cologuard test 05/30/2019   Diabetic foot ulcer ([place]) 09/06/2018   Orthostasis 08/25/2016    Congestive heart failure ([place]) 05/17/2016   Bilateral carotid artery disease ([place]) 07/08/2014   Obstructive sleep apnea 02/12/2014   Restless legs 10/23/2013   Overweight with body mass index (BMI) of 29 to 29.9 in adult 10/23/2013   Hyperlipidemia 07/16/2013   Peripheral arterial disease ([place]) 07/16/2013   Hypertension 03/14/2012   Type 2 diabetes mellitus with circulatory disorder ([place]) 03/14/2012   Peripheral neuropathy 03/14/2012   Cardiomyopathy- EF NL 01/2013, now 30-35% echo 03/14/2012   Rayetta Humphrey, PT CLT [phone]  06/08/2021, 12:07 PM  [place] [address] [place], [place], [address] Phone: [phone]   Fax:  [phone]  Name: [name] MRN: [id number] Date of Birth: 1945-12-11

## 2021-06-10 ENCOUNTER — Encounter (HOSPITAL_COMMUNITY): Payer: Self-pay | Admitting: Physical Therapy

## 2021-06-10 ENCOUNTER — Encounter (HOSPITAL_COMMUNITY): Payer: Self-pay

## 2021-06-10 ENCOUNTER — Other Ambulatory Visit: Payer: Self-pay

## 2021-06-10 ENCOUNTER — Ambulatory Visit (HOSPITAL_COMMUNITY): Payer: HMO | Admitting: Physical Therapy

## 2021-06-10 ENCOUNTER — Ambulatory Visit (HOSPITAL_COMMUNITY): Payer: HMO

## 2021-06-10 DIAGNOSIS — R2681 Unsteadiness on feet: Secondary | ICD-10-CM

## 2021-06-10 DIAGNOSIS — M6281 Muscle weakness (generalized): Secondary | ICD-10-CM | POA: Diagnosis not present

## 2021-06-10 DIAGNOSIS — G8929 Other chronic pain: Secondary | ICD-10-CM

## 2021-06-10 DIAGNOSIS — M25612 Stiffness of left shoulder, not elsewhere classified: Secondary | ICD-10-CM

## 2021-06-10 DIAGNOSIS — R29898 Other symptoms and signs involving the musculoskeletal system: Secondary | ICD-10-CM

## 2021-06-10 DIAGNOSIS — R262 Difficulty in walking, not elsewhere classified: Secondary | ICD-10-CM

## 2021-06-10 NOTE — Patient Instructions (Addendum)
  Complete 10-15 times each. 1 time a day.    Shoulder Abduction        Sitting:      Begin with your arms flat next to your side. Slowly move your arms out to the side so that they go overhead, in a jumping jack or snow angel movement.  2) Shoulder Flexion            Sitting:         Begin with arms at your side with thumbs pointed up, slowly raise both arms up and forward towards overhead.

## 2021-06-10 NOTE — Patient Instructions (Signed)
Access Code: [email or web address] URL: [email or web address] Date: 06/10/2021 Prepared by: Josue Hector  Exercises Heel Raises with Counter Support - 1-2 x daily - 7 x weekly - 2 sets - 10 reps Heel Toe Raises with Counter Support - 1-2 x daily - 7 x weekly - 2 sets - 10 reps Standing Hip Abduction with Counter Support - 1-2 x daily - 7 x weekly - 2 sets - 10 reps Standing Hip Extension with Counter Support - 1-2 x daily - 7 x weekly - 2 sets - 10 reps Standing Knee Flexion with Counter Support - 1-2 x daily - 7 x weekly - 2 sets - 10 reps

## 2021-06-10 NOTE — Therapy (Signed)
[facility] [address] [place], [place], [address] Phone: [phone]   Fax:  [phone]  Physical Therapy Treatment  Patient Details  Name: [name] MRN: [id number] Date of Birth: 1946/05/10 Referring Provider (PT): Noreene Larsson, NP   Encounter Date: 06/10/2021   PT End of Session - 06/10/21 0857     Visit Number 13    Number of Visits 26    Date for PT Re-Evaluation 07/02/21    Authorization Type [employer], no VL, no auth ,    Progress Note Due on Visit 20    PT Start Time 0900    PT Stop Time 0940    PT Time Calculation (min) 40 min    Activity Tolerance Patient tolerated treatment well    Behavior During Therapy [facility] for tasks assessed/performed             Past Medical History:  Diagnosis Date   Aftercare following surgery of the circulatory system, NEC 12/04/2013   Angina decubitus ([place]) 05/19/2016   Arrhythmia 05/20/2016   Arthritis    ARTHRITIS, RIGHT FOOT 06/26/2008   Qualifier: Diagnosis of  By: Aline Brochure MD, [name]     Cardiomyopathy- EF NL 01/2013, now 30-35% echo 03/14/2012   Carotid artery occlusion    left s/p CEA   Cellulitis of left foot 05/07/2018   CHF (congestive heart failure) ([place])    Critical lower limb ischemia ([place]) 02/28/2014   Critical limb ischemia    Diabetes mellitus    Elevated troponin 04/11/2016   GERD (gastroesophageal reflux disease)    History of stroke June 2013 03/14/2012   HOH (hard of hearing)    Hypertension    Hypothyroidism    Nonhealing skin ulcer ([place]) 10/28/2014   Obstructive sleep apnea    Orthostatic hypotension    Osteomyelitis ([place])    left great toe   Pain in the chest 05/17/2016   Peripheral arterial disease ([place]), s/p PTA x 2 RLE    nonhealing ulcers bilaterally on each great toe   Pneumonia    PONV (postoperative nausea and vomiting)    Pulmonary nodule 05/17/2016   Restless leg syndrome    Shortness of breath    Stroke [facility]) March 08, 2012   Toe  osteomyelitis, left [facility])    Ulcer of great toe, left, with necrosis of bone ([place])    Wears dentures     Past Surgical History:  Procedure Laterality Date   AMPUTATION TOE Left 07/25/2018   Procedure: AMPUTATION TOE INTERPHALANGEAL HALLUX LEFT;  Surgeon: Evelina Bucy, DPM;  Location: [place];  Service: Podiatry;  Laterality: Left;   ANGIOPLASTY  02/28/14   diamond back orbital rotational atherectomy of Rt. tibial   BACK SURGERY     BONE BIOPSY Left 07/25/2018   Procedure: SUPERFICIAL BONE BIOPSY;  Surgeon: Evelina Bucy, DPM;  Location: [place];  Service: Podiatry;  Laterality: Left;   CARDIAC CATHETERIZATION N/A 05/19/2016   Procedure: Right/Left Heart Cath and Coronary Angiography;  Surgeon: Belva Crome, MD;  Location: [place] CV LAB;  Service: Cardiovascular;  Laterality: N/A;   CERVICAL FUSION     ENDARTERECTOMY Left 11/29/2013   Procedure: ENDARTERECTOMY CAROTID;  Surgeon: Serafina Mitchell, MD;  Location: [place];  Service: Vascular;  Laterality: Left;   ESOPHAGOGASTRODUODENOSCOPY  02/2010   Dr. Gala Romney: patient presented with food impaction, schatzki ring with superimposed component of stricture with erosive reflux esophagitis, s/p disimpaction but dilation planned at later  date    ESOPHAGOGASTRODUODENOSCOPY (EGD) WITH PROPOFOL N/A 08/13/2019   Dr. Gala Romney: Erosive reflux esophagitis with mild stricture and incidental Mallory-Weiss tear which precluded esophageal dilation.  Medium sized hiatal hernia.   ESOPHAGOGASTRODUODENOSCOPY (EGD) WITH PROPOFOL N/A 10/11/2019   Dr. Gala Romney: Esophageal stenosis status post dilation, moderate hiatal hernia   EYE SURGERY     FLEXIBLE SIGMOIDOSCOPY N/A 08/13/2019   Procedure: FLEXIBLE SIGMOIDOSCOPY;  Surgeon: [name], MD;  [facility];  Service: Endoscopy;  Laterality: N/A;  colonoscopy aboerted due to formed stool and poor prep   FOOT SURGERY     KNEE ARTHROSCOPY WITH MEDIAL MENISECTOMY Left 05/06/2020   Procedure: KNEE ARTHROSCOPY  WITH MEDIAL MENISCECTOMY AND LATERAL MENISCECTOMY;  Surgeon: [name], MD;  Location: [facility];  Service: Orthopedics;  Laterality: Left;   Lower ext duplex doppler  03/14/14   Rt ABI 1.2   LOWER EXTREMITY ANGIOGRAM Bilateral 02/18/2014   Procedure: LOWER EXTREMITY ANGIOGRAM;  Surgeon: [name], MD;  Location: [facility] CATH LAB;  Service: Cardiovascular;  Laterality: Bilateral;   LOWER EXTREMITY ANGIOGRAM N/A 10/31/2014   Procedure: LOWER EXTREMITY ANGIOGRAM;  Surgeon: [name], MD;  Location: [facility] CATH LAB;  Service: Cardiovascular;  Laterality: N/A;   MALONEY DILATION N/A 10/11/2019   Procedure: Venia Minks DILATION;  Surgeon: [name], MD;  [facility];  Service: Endoscopy;  Laterality: N/A;   MULTIPLE TOOTH EXTRACTIONS     PV angiogram  02/18/2014   tibial vessel diseas bil.   SPINE SURGERY     tendon achillies lengthing and sesamoid      There were no vitals filed for this visit.   Subjective Assessment - 06/10/21 0903     Subjective States he has been working on exercises at home and at the [facility]. He notes he still has some trouble with LT leg during sidestepping.    Pertinent History CHF , R foot wound, left knee scope, DB, Restless leg syndrome, hx of stroke.    Limitations House hold activities;Standing;Lifting    Patient Stated Goals be able to walk normally and function correctly.    Currently in Pain? No/denies                               [facility] Adult PT Treatment/Exercise - 06/10/21 0001       Knee/Hip Exercises: Standing   Heel Raises Both;20 reps    Heel Raises Limitations Toe raise x20    Knee Flexion Both;2 sets;10 reps    Knee Flexion Limitations 5 lb    Hip Flexion Both;2 sets;10 reps    Hip Flexion Limitations 5lb    Hip Abduction Both;2 sets;10 reps    Abduction Limitations 5lb    Hip Extension Both;2 sets;10 reps    Extension Limitations 5lb      Knee/Hip Exercises: Seated   Long Arc Quad 2 sets;10 reps     Long Arc Quad Weight 5 lbs.    Sit to Sand 2 sets;10 reps;without UE support      Knee/Hip Exercises: Supine   Bridges 2 sets;10 reps    Straight Leg Raises 2 sets;10 reps;Both;Strengthening    Other Supine Knee/Hip Exercises bent knee marching x 20      Knee/Hip Exercises: Sidelying   Clams 2 x 10                       PT  Short Term Goals - 05/21/21 1501       PT SHORT TERM GOAL #1   Title Patient will be independent in self management strategies to improve quality of life and functional outcomes.    Baseline Reports variable compliance    Time 3    Period Weeks    Status Partially Met    Target Date 04/27/21      PT SHORT TERM GOAL #2   Title Patient will be able to report at least 25% improvement in overall mobility.    Baseline Reports 50% improved    Time 3    Period Weeks    Status Achieved    Target Date 04/27/21      PT SHORT TERM GOAL #3   Title Demo improved balance and BLE strength as evidenced by time of 18 sec 5xSTS test    Baseline 13 seconds with use of UES, but uses heavy momentum use of UEs and does not fully extend knees    Time 3    Period Weeks    Status Partially Met    Target Date 04/27/21               PT Long Term Goals - 06/04/21 1202       PT LONG TERM GOAL #1   Title Patient will improve FOTO score by at least 10 points in order to indicate improved tolerance to activity.    Baseline 24% decrease    Time 6    Period Weeks    Status On-going      PT LONG TERM GOAL #2   Title Demo improved gait velocity and safety as evidenced by distance of 150 ft during 2MWT    Baseline 60 ft with rollator    Time 6    Period Weeks    Status On-going      PT LONG TERM GOAL #3   Title Patient will not have foot wound present to reduce risk of infection.    Time 4    Period Weeks    Target Date 07/02/21      PT LONG TERM GOAL #4   Title Patient will verbalize proper foot care techniques.    Time 4    Period Weeks    Status  New    Target Date 07/02/21                   Plan - 06/10/21 0940     Clinical Impression Statement Progressed LE strengthening activity this session. Patient tolerated well with minimal fatigue. More fatigue noted toward end of session, and provided rest breaks as needed between ther ex. Patient cued on upright posturing during standing hip extensions and abduction. Continues to note increased difficulty/ weakness on LLE but did better standing than with attempted sidelying hip abduction. Updated and issued HEP handout. Patient will continue to benefit from skilled therapy services to reduce deficits and improve functional ability.    Personal Factors and Comorbidities Comorbidity 1;Comorbidity 3+;Comorbidity 2    Comorbidities CHF, left knee surgery, right chronic foot wound, DB, restless leg syndromes    Examination-Activity Limitations Bathing;Lift;Locomotion Level;Transfers;Stand;Stairs;Squat;Reach Overhead;Carry    Examination-Participation Restrictions Community Activity;Meal Prep    Stability/Clinical Decision Making Evolving/Moderate complexity    Rehab Potential Fair    PT Frequency 2x / week    PT Duration 4 weeks    PT Treatment/Interventions ADLs/Self Care Home Management;Other (comment);Moist Heat;Balance training;Therapeutic exercise;Therapeutic activities;Functional mobility training;Stair training;Gait training;DME Instruction;Ultrasound;Neuromuscular re-education;Patient/family education;Orthotic  Fit/Training;Manual techniques;Taping;Passive range of motion;Electrical Stimulation    PT Next Visit Plan Continue LE strength, gait and balance as tolerated.    PT Home Exercise Plan hip abd/add isometric, QS, hamstring stretch;  04/15/21: Isometric abd, abd supine, SAQ, bridge 9/1 clamshell, sideyling hip abduciton 9/21 standing hip abduction/ extenison, knee flexion, heel raise/ toe raise    Consulted and Agree with Plan of Care Patient             Patient will  benefit from skilled therapeutic intervention in order to improve the following deficits and impairments:  Abnormal gait, Decreased endurance, Decreased skin integrity, Increased edema, Decreased activity tolerance, Decreased balance, Decreased mobility, Decreased knowledge of use of DME, Decreased strength, Difficulty walking, Pain, Decreased range of motion, Impaired flexibility, Postural dysfunction, Improper body mechanics  Visit Diagnosis: Muscle weakness (generalized)  Difficulty in walking, not elsewhere classified  Unsteadiness on feet     Problem List Patient Active Problem List   Diagnosis Date Noted   Hypokalemia 05/18/2021   Muscular deconditioning 05/12/2021   Frequent falls 05/12/2021   Fatigue 04/29/2021   Recent unexplained weight loss 04/23/2021   Left shoulder pain 04/23/2021   Chest tightness 04/23/2021   Deep tissue injury 04/23/2021   Myoclonus 04/15/2021   Pneumonia 04/03/2021   PNA (pneumonia) 04/02/2021   Abnormal finding on urinalysis 04/01/2021   Syncope 02/16/2021   Minor head injury    Right hip pain 02/11/2021   Erectile dysfunction 01/22/2021   Gait abnormality 09/30/2020   Mild non proliferative diabetic retinopathy ([place]) 07/16/2020   Need for immunization against influenza 06/03/2020   S/P left knee arthroscopy 05/06/20 05/13/2020   Abnormal MRI 03/26/2020   Joint disorder of knee 03/26/2020   Fall at home, initial encounter 03/13/2020   Anemia 09/03/2019   Reflux esophagitis 09/03/2019   Esophageal dysphagia 05/30/2019   Constipation 05/30/2019   Positive colorectal cancer screening using Cologuard test 05/30/2019   Diabetic foot ulcer ([place]) 09/06/2018   Orthostasis 08/25/2016   Congestive heart failure ([place]) 05/17/2016   Bilateral carotid artery disease ([place]) 07/08/2014   Obstructive sleep apnea 02/12/2014   Restless legs 10/23/2013   Overweight with body mass index (BMI) of 29 to 29.9 in adult 10/23/2013   Hyperlipidemia 07/16/2013    Peripheral arterial disease ([place]) 07/16/2013   Hypertension 03/14/2012   Type 2 diabetes mellitus with circulatory disorder ([place]) 03/14/2012   Peripheral neuropathy 03/14/2012   Cardiomyopathy- EF NL 01/2013, now 30-35% echo 03/14/2012   9:45 AM, 06/10/21 Josue Hector PT DPT  Physical Therapist with [facility]  [phone] [place] [address] [place], [place], [address] Phone: [phone]   Fax:  [phone]  Name: [name] MRN: [id number] Date of Birth: 04-Dec-1945

## 2021-06-10 NOTE — Therapy (Signed)
[facility] [address] [place], [place], [address] Phone: [phone]   Fax:  [phone]  Occupational Therapy Treatment  Patient Details  Name: [name] MRN: [id number] Date of Birth: 1946/06/11 Referring Provider (OT): Demetrius Revel, NP   Encounter Date: 06/10/2021   OT End of Session - 06/10/21 1021     Visit Number 8    Number of Visits 10    Date for OT Re-Evaluation 06/26/21    Authorization Type [employer]; $30 copay    Authorization Time Period no visit limit    Progress Note Due on Visit 10    OT Start Time 0947    OT Stop Time 1029    OT Time Calculation (min) 42 min    Activity Tolerance Patient tolerated treatment well    Behavior During Therapy [facility] for tasks assessed/performed             Past Medical History:  Diagnosis Date   Aftercare following surgery of the circulatory system, NEC 12/04/2013   Angina decubitus ([place]) 05/19/2016   Arrhythmia 05/20/2016   Arthritis    ARTHRITIS, RIGHT FOOT 06/26/2008   Qualifier: Diagnosis of  By: Aline Brochure MD, [name]     Cardiomyopathy- EF NL 01/2013, now 30-35% echo 03/14/2012   Carotid artery occlusion    left s/p CEA   Cellulitis of left foot 05/07/2018   CHF (congestive heart failure) ([place])    Critical lower limb ischemia ([place]) 02/28/2014   Critical limb ischemia    Diabetes mellitus    Elevated troponin 04/11/2016   GERD (gastroesophageal reflux disease)    History of stroke June 2013 03/14/2012   HOH (hard of hearing)    Hypertension    Hypothyroidism    Nonhealing skin ulcer ([place]) 10/28/2014   Obstructive sleep apnea    Orthostatic hypotension    Osteomyelitis ([place])    left great toe   Pain in the chest 05/17/2016   Peripheral arterial disease ([place]), s/p PTA x 2 RLE    nonhealing ulcers bilaterally on each great toe   Pneumonia    PONV (postoperative nausea and vomiting)    Pulmonary nodule 05/17/2016   Restless leg syndrome    Shortness of breath     Stroke [facility]) March 08, 2012   Toe osteomyelitis, left [facility])    Ulcer of great toe, left, with necrosis of bone ([place])    Wears dentures     Past Surgical History:  Procedure Laterality Date   AMPUTATION TOE Left 07/25/2018   Procedure: AMPUTATION TOE INTERPHALANGEAL HALLUX LEFT;  Surgeon: Evelina Bucy, DPM;  Location: [place];  Service: Podiatry;  Laterality: Left;   ANGIOPLASTY  02/28/14   diamond back orbital rotational atherectomy of Rt. tibial   BACK SURGERY     BONE BIOPSY Left 07/25/2018   Procedure: SUPERFICIAL BONE BIOPSY;  Surgeon: Evelina Bucy, DPM;  Location: [place];  Service: Podiatry;  Laterality: Left;   CARDIAC CATHETERIZATION N/A 05/19/2016   Procedure: Right/Left Heart Cath and Coronary Angiography;  Surgeon: Belva Crome, MD;  Location: [place] CV LAB;  Service: Cardiovascular;  Laterality: N/A;   CERVICAL FUSION     ENDARTERECTOMY Left 11/29/2013   Procedure: ENDARTERECTOMY CAROTID;  Surgeon: Serafina Mitchell, MD;  Location: [place];  Service: Vascular;  Laterality: Left;   ESOPHAGOGASTRODUODENOSCOPY  02/2010   Dr. Gala Romney: patient presented with food impaction, schatzki ring with superimposed component of stricture with erosive reflux esophagitis, s/p disimpaction  but dilation planned at later date    ESOPHAGOGASTRODUODENOSCOPY (EGD) WITH PROPOFOL N/A 08/13/2019   Dr. Gala Romney: Erosive reflux esophagitis with mild stricture and incidental Mallory-Weiss tear which precluded esophageal dilation.  Medium sized hiatal hernia.   ESOPHAGOGASTRODUODENOSCOPY (EGD) WITH PROPOFOL N/A 10/11/2019   Dr. Gala Romney: Esophageal stenosis status post dilation, moderate hiatal hernia   EYE SURGERY     FLEXIBLE SIGMOIDOSCOPY N/A 08/13/2019   Procedure: FLEXIBLE SIGMOIDOSCOPY;  Surgeon: [name], MD;  [facility];  Service: Endoscopy;  Laterality: N/A;  colonoscopy aboerted due to formed stool and poor prep   FOOT SURGERY     KNEE ARTHROSCOPY WITH MEDIAL MENISECTOMY Left  05/06/2020   Procedure: KNEE ARTHROSCOPY WITH MEDIAL MENISCECTOMY AND LATERAL MENISCECTOMY;  Surgeon: [name], MD;  [facility];  Service: Orthopedics;  Laterality: Left;   Lower ext duplex doppler  03/14/14   Rt ABI 1.2   LOWER EXTREMITY ANGIOGRAM Bilateral 02/18/2014   Procedure: LOWER EXTREMITY ANGIOGRAM;  Surgeon: [name], MD;  Location: [facility] CATH LAB;  Service: Cardiovascular;  Laterality: Bilateral;   LOWER EXTREMITY ANGIOGRAM N/A 10/31/2014   Procedure: LOWER EXTREMITY ANGIOGRAM;  Surgeon: [name], MD;  Location: [facility] CATH LAB;  Service: Cardiovascular;  Laterality: N/A;   MALONEY DILATION N/A 10/11/2019   Procedure: Venia Minks DILATION;  Surgeon: [name], MD;  [facility];  Service: Endoscopy;  Laterality: N/A;   MULTIPLE TOOTH EXTRACTIONS     PV angiogram  02/18/2014   tibial vessel diseas bil.   SPINE SURGERY     tendon achillies lengthing and sesamoid      There were no vitals filed for this visit.   Subjective Assessment - 06/10/21 0956     Subjective  S: I'm getting better at doing my exercises.    Currently in Pain? No/denies                [facility] OT Assessment - 06/10/21 0956       Assessment   Medical Diagnosis Chronic left shoulder pain      Precautions   Precautions Fall                      OT Treatments/Exercises (OP) - 06/10/21 0957       Exercises   Exercises Shoulder      Shoulder Exercises: Supine   Protraction AROM;10 reps    Horizontal ABduction PROM;5 reps;AROM;20 reps    External Rotation PROM;5 reps;AROM;10 reps    Internal Rotation PROM;5 reps;AROM;10 reps    Flexion PROM;5 reps;AROM;10 reps    ABduction PROM;5 reps;AROM;10 reps      Shoulder Exercises: Seated   Protraction AROM;20 reps    Horizontal ABduction AROM;20 reps    Flexion AROM;10 reps;Limitations    Flexion Limitations just above 90 degrees    Abduction AROM;10 reps;Limitations    ABduction Limitations to 90  degrees    Other Seated Exercises Basketball; seated; circles (right/left), flexion, chest press, 10X-20X      Shoulder Exercises: ROM/Strengthening   UBE (Upper Arm Bike) Level 3 2' forward 2' reverse   pace: 11.5-12.0   Over Head Lace seated. Pt started at top of chain and laced to bottom only.                    OT Education - 06/10/21 1018     Education Details re-print of HEP (A/ROM flexion and abduction) per patient's request  Person(s) Educated Patient    Methods Explanation;Demonstration;Handout    Comprehension Verbalized understanding;Returned demonstration              OT Short Term Goals - 05/29/21 1322       OT SHORT TERM GOAL #1   Title Pt will be provided with and educated on HEP to improve mobility of LUE required during ADL completion.    Baseline 9/9: Pt will need continued education of tasks to increase L UE function. Pt reports moderate difficult with ADL tasks due to L UE.    Time 4    Period Weeks    Status On-going    Target Date 06/26/21      OT SHORT TERM GOAL #2   Title Pt will decrease pain in LUE to 3/10 or less to improve ability to sleep for 3+ hours without waking due to pain.    Baseline 9/9: Pt does reported 3/10 pain but is not able to consistently sleep 3+ hours without waking due to pain. Pt reports that need to urnate due to water pills is also a contributing factor.    Time 4    Period Weeks    Status Partially Met      OT SHORT TERM GOAL #3   Title Pt will decrease LUE fascial restrictions to min amounts or less to improve ability to complete functional reaching tasks.    Baseline 9/9: Pt reports continued deficits in functional reaching.    Time 4    Period Weeks    Status On-going      OT SHORT TERM GOAL #4   Title Pt will increase LUE A/ROM to [facility] to improve ability to reach for clothes or items in closet.    Baseline 9/9: 9/9: Pt reports continued deficits in functional reaching overhead.    Time 4    Period  Weeks    Status On-going      OT SHORT TERM GOAL #5   Title Pt will increase LUE strength to 4/5 or greater to improve ability to lift cast iron skillet during meal prep tasks.    Baseline 9/9: Pt is not yet meeting this goal due to being at 3/5 MMT or below for most shoulder movements.    Time 4    Period Weeks    Status On-going                      Plan - 06/10/21 1100     Clinical Impression Statement A: Able to achieve P/ROM to [facility] supine. When seated, shoulder flexion and abduction remain at approximately 90 degrees and when standing available range does increase. Added shoulder strengthening with basketball and overhead lacing to increase shoulder stability. VC for form and technique were provided.    Body Structure / Function / Physical Skills ADL;Endurance;UE functional use;Fascial restriction;Pain;ROM;IADL;Strength    Plan P: Functional reaching task standing to maximum available range and functional performance.    Consulted and Agree with Plan of Care Patient             Patient will benefit from skilled therapeutic intervention in order to improve the following deficits and impairments:   Body Structure / Function / Physical Skills: ADL, Endurance, UE functional use, Fascial restriction, Pain, ROM, IADL, Strength       Visit Diagnosis: Chronic left shoulder pain  Other symptoms and signs involving the musculoskeletal system  Stiffness of left shoulder, not elsewhere classified    Problem List  Patient Active Problem List   Diagnosis Date Noted   Hypokalemia 05/18/2021   Muscular deconditioning 05/12/2021   Frequent falls 05/12/2021   Fatigue 04/29/2021   Recent unexplained weight loss 04/23/2021   Left shoulder pain 04/23/2021   Chest tightness 04/23/2021   Deep tissue injury 04/23/2021   Myoclonus 04/15/2021   Pneumonia 04/03/2021   PNA (pneumonia) 04/02/2021   Abnormal finding on urinalysis 04/01/2021   Syncope 02/16/2021   Minor head  injury    Right hip pain 02/11/2021   Erectile dysfunction 01/22/2021   Gait abnormality 09/30/2020   Mild non proliferative diabetic retinopathy ([place]) 07/16/2020   Need for immunization against influenza 06/03/2020   S/P left knee arthroscopy 05/06/20 05/13/2020   Abnormal MRI 03/26/2020   Joint disorder of knee 03/26/2020   Fall at home, initial encounter 03/13/2020   Anemia 09/03/2019   Reflux esophagitis 09/03/2019   Esophageal dysphagia 05/30/2019   Constipation 05/30/2019   Positive colorectal cancer screening using Cologuard test 05/30/2019   Diabetic foot ulcer ([place]) 09/06/2018   Orthostasis 08/25/2016   Congestive heart failure ([place]) 05/17/2016   Bilateral carotid artery disease ([place]) 07/08/2014   Obstructive sleep apnea 02/12/2014   Restless legs 10/23/2013   Overweight with body mass index (BMI) of 29 to 29.9 in adult 10/23/2013   Hyperlipidemia 07/16/2013   Peripheral arterial disease ([place]) 07/16/2013   Hypertension 03/14/2012   Type 2 diabetes mellitus with circulatory disorder ([place]) 03/14/2012   Peripheral neuropathy 03/14/2012   Cardiomyopathy- EF NL 01/2013, now 30-35% echo 03/14/2012    Ailene Ravel, OTR/L,CBIS  [phone]  06/10/2021, 4:33 PM  [place], [place], [address] Phone: [phone]   Fax:  [phone]  Name: [name] MRN: [id number] Date of Birth: 16-Apr-1946

## 2021-06-11 DIAGNOSIS — R296 Repeated falls: Secondary | ICD-10-CM | POA: Diagnosis not present

## 2021-06-11 DIAGNOSIS — Z79899 Other long term (current) drug therapy: Secondary | ICD-10-CM | POA: Diagnosis not present

## 2021-06-11 DIAGNOSIS — G253 Myoclonus: Secondary | ICD-10-CM | POA: Diagnosis not present

## 2021-06-11 DIAGNOSIS — G2581 Restless legs syndrome: Secondary | ICD-10-CM | POA: Diagnosis not present

## 2021-06-12 ENCOUNTER — Ambulatory Visit (HOSPITAL_COMMUNITY): Payer: HMO | Admitting: Physical Therapy

## 2021-06-15 ENCOUNTER — Ambulatory Visit (INDEPENDENT_AMBULATORY_CARE_PROVIDER_SITE_OTHER): Payer: HMO | Admitting: Podiatry

## 2021-06-15 ENCOUNTER — Other Ambulatory Visit: Payer: Self-pay

## 2021-06-15 DIAGNOSIS — E1169 Type 2 diabetes mellitus with other specified complication: Secondary | ICD-10-CM | POA: Diagnosis not present

## 2021-06-15 DIAGNOSIS — L97412 Non-pressure chronic ulcer of right heel and midfoot with fat layer exposed: Secondary | ICD-10-CM

## 2021-06-15 DIAGNOSIS — E1142 Type 2 diabetes mellitus with diabetic polyneuropathy: Secondary | ICD-10-CM | POA: Diagnosis not present

## 2021-06-15 DIAGNOSIS — E08621 Diabetes mellitus due to underlying condition with foot ulcer: Secondary | ICD-10-CM

## 2021-06-15 DIAGNOSIS — B351 Tinea unguium: Secondary | ICD-10-CM

## 2021-06-15 DIAGNOSIS — E1159 Type 2 diabetes mellitus with other circulatory complications: Secondary | ICD-10-CM

## 2021-06-15 NOTE — Progress Notes (Signed)
  Subjective:  Patient ID: [name], male    DOB: 1946-08-13,  MRN: [id number]  Chief Complaint  Patient presents with   Diabetic Ulcer    Right midfoot, 4 week follow up     75 y.o. male presents with the above complaint. History confirmed with patient.  Think he is doing much better  Objective:  Physical Exam: warm, good capillary refill, no trophic changes or ulcerative lesions, normal DP and PT pulses, and loss of protective sensation Right foot healed ulceration with no open wound or drainage    Assessment:   1. Type 2 diabetes mellitus with other circulatory complication, without long-term current use of insulin ([place])   2. Diabetic ulcer of right midfoot associated with diabetes mellitus due to underlying condition, with fat layer exposed ([place])   3. Onychomycosis of multiple toenails with type 2 diabetes mellitus and peripheral neuropathy ([place])      Plan:  Patient was evaluated and treated and all questions answered.  Wound is healed since last visit.  I canceled the MRI as it is no longer necessary.  Advised to continue for daily inspection and wear his diabetic shoes.  Return to see me as needed for this.  Patient educated on diabetes. Discussed proper diabetic foot care and discussed risks and complications of disease. Educated patient in depth on reasons to return to the office immediately should he/she discover anything concerning or new on the feet. All questions answered. Discussed proper shoes as well.   Discussed the etiology and treatment options for the condition in detail with the patient. Educated patient on the topical and oral treatment options for mycotic nails. Recommended debridement of the nails today. Sharp and mechanical debridement performed of all painful and mycotic nails today. Nails debrided in length and thickness using a nail nipper to level of comfort. Discussed treatment options including appropriate shoe gear. Follow up as needed for painful  nails.    Return in about 3 months (around 09/14/2021) for at risk diabetic foot care.

## 2021-06-16 ENCOUNTER — Encounter (HOSPITAL_COMMUNITY): Payer: HMO | Admitting: Physical Therapy

## 2021-06-16 ENCOUNTER — Ambulatory Visit (HOSPITAL_COMMUNITY): Payer: HMO | Admitting: Physical Therapy

## 2021-06-16 DIAGNOSIS — R262 Difficulty in walking, not elsewhere classified: Secondary | ICD-10-CM

## 2021-06-16 DIAGNOSIS — R2681 Unsteadiness on feet: Secondary | ICD-10-CM

## 2021-06-16 DIAGNOSIS — M6281 Muscle weakness (generalized): Secondary | ICD-10-CM

## 2021-06-16 NOTE — Therapy (Signed)
[facility] [address] [place], [place], [address] Phone: [phone]   Fax:  [phone]  Physical Therapy Treatment  Patient Details  Name: [name] MRN: [id number] Date of Birth: April 04, 1946 Referring Provider (PT): Noreene Larsson, NP   Encounter Date: 06/16/2021   PT End of Session - 06/16/21 1547     Visit Number 14    Number of Visits 26    Date for PT Re-Evaluation 07/02/21    Authorization Type [employer], no VL, no auth ,    Progress Note Due on Visit 20    PT Start Time 6962    PT Stop Time 9528    PT Time Calculation (min) 45 min    Activity Tolerance Patient tolerated treatment well    Behavior During Therapy [facility] for tasks assessed/performed             Past Medical History:  Diagnosis Date   Aftercare following surgery of the circulatory system, NEC 12/04/2013   Angina decubitus ([place]) 05/19/2016   Arrhythmia 05/20/2016   Arthritis    ARTHRITIS, RIGHT FOOT 06/26/2008   Qualifier: Diagnosis of  By: Aline Brochure MD, [name]     Cardiomyopathy- EF NL 01/2013, now 30-35% echo 03/14/2012   Carotid artery occlusion    left s/p CEA   Cellulitis of left foot 05/07/2018   CHF (congestive heart failure) ([place])    Critical lower limb ischemia ([place]) 02/28/2014   Critical limb ischemia    Diabetes mellitus    Elevated troponin 04/11/2016   GERD (gastroesophageal reflux disease)    History of stroke June 2013 03/14/2012   HOH (hard of hearing)    Hypertension    Hypothyroidism    Nonhealing skin ulcer ([place]) 10/28/2014   Obstructive sleep apnea    Orthostatic hypotension    Osteomyelitis ([place])    left great toe   Pain in the chest 05/17/2016   Peripheral arterial disease ([place]), s/p PTA x 2 RLE    nonhealing ulcers bilaterally on each great toe   Pneumonia    PONV (postoperative nausea and vomiting)    Pulmonary nodule 05/17/2016   Restless leg syndrome    Shortness of breath    Stroke [facility]) March 08, 2012   Toe  osteomyelitis, left [facility])    Ulcer of great toe, left, with necrosis of bone ([place])    Wears dentures     Past Surgical History:  Procedure Laterality Date   AMPUTATION TOE Left 07/25/2018   Procedure: AMPUTATION TOE INTERPHALANGEAL HALLUX LEFT;  Surgeon: Evelina Bucy, DPM;  Location: [facility];  Service: Podiatry;  Laterality: Left;   ANGIOPLASTY  02/28/14   diamond back orbital rotational atherectomy of Rt. tibial   BACK SURGERY     BONE BIOPSY Left 07/25/2018   Procedure: SUPERFICIAL BONE BIOPSY;  Surgeon: Evelina Bucy, DPM;  Location: [facility];  Service: Podiatry;  Laterality: Left;   CARDIAC CATHETERIZATION N/A 05/19/2016   Procedure: Right/Left Heart Cath and Coronary Angiography;  Surgeon: Belva Crome, MD;  Location: [facility];  Service: Cardiovascular;  Laterality: N/A;   CERVICAL FUSION     ENDARTERECTOMY Left 11/29/2013   Procedure: ENDARTERECTOMY CAROTID;  Surgeon: Serafina Mitchell, MD;  Location: [facility];  Service: Vascular;  Laterality: Left;   ESOPHAGOGASTRODUODENOSCOPY  02/2010   Dr. Gala Romney: patient presented with food impaction, schatzki ring with superimposed component of stricture with erosive reflux esophagitis, s/p disimpaction but dilation planned at later  date    ESOPHAGOGASTRODUODENOSCOPY (EGD) WITH PROPOFOL N/A 08/13/2019   Dr. Gala Romney: Erosive reflux esophagitis with mild stricture and incidental Mallory-Weiss tear which precluded esophageal dilation.  Medium sized hiatal hernia.   ESOPHAGOGASTRODUODENOSCOPY (EGD) WITH PROPOFOL N/A 10/11/2019   Dr. Gala Romney: Esophageal stenosis status post dilation, moderate hiatal hernia   EYE SURGERY     FLEXIBLE SIGMOIDOSCOPY N/A 08/13/2019   Procedure: FLEXIBLE SIGMOIDOSCOPY;  Surgeon: [name], MD;  [facility];  Service: Endoscopy;  Laterality: N/A;  colonoscopy aboerted due to formed stool and poor prep   FOOT SURGERY     KNEE ARTHROSCOPY WITH MEDIAL MENISECTOMY Left 05/06/2020   Procedure: KNEE ARTHROSCOPY  WITH MEDIAL MENISCECTOMY AND LATERAL MENISCECTOMY;  Surgeon: [name], MD;  Location: [facility];  Service: Orthopedics;  Laterality: Left;   Lower ext duplex doppler  03/14/14   Rt ABI 1.2   LOWER EXTREMITY ANGIOGRAM Bilateral 02/18/2014   Procedure: LOWER EXTREMITY ANGIOGRAM;  Surgeon: [name], MD;  Location: [facility] C[facility];  Service: Cardiovascular;  Laterality: Bilateral;   LOWER EXTREMITY ANGIOGRAM N/A 10/31/2014   Procedure: LOWER EXTREMITY ANGIOGRAM;  Surgeon: [name], MD;  Location: [facility] CATH LAB;  Service: Cardiovascular;  Laterality: N/A;   MALONEY DILATION N/A 10/11/2019   Procedure: Venia Minks DILATION;  Surgeon: [name], MD;  [facility];  Service: Endoscopy;  Laterality: N/A;   MULTIPLE TOOTH EXTRACTIONS     PV angiogram  02/18/2014   tibial vessel diseas bil.   SPINE SURGERY     tendon achillies lengthing and sesamoid      There were no vitals filed for this visit.   Subjective Assessment - 06/16/21 1613     Subjective States he's meeting with the wellness instructor at the [facility] this Friday.  STates he's been a couple time so far and is doing some of his exercises at home.    Currently in Pain? No/denies                               [facility] Adult PT Treatment/Exercise - 06/16/21 0001       Knee/Hip Exercises: Standing   Heel Raises Both;20 reps    Heel Raises Limitations Toe raise x20    Hip Flexion Both;3 sets;10 reps    Hip Flexion Limitations 5lb    Forward Lunges Right;Left;2 sets;5 reps;10 reps;Limitations    Forward Lunges Limitations onto 4" step with intermittent HHA    Hip Abduction Both;3 sets;10 reps    Abduction Limitations 5lb    Hip Extension Both;10 reps;3 sets    Extension Limitations 5lb    Other Standing Knee Exercises side stepping x 3RT with 1 UE only in // bars      Knee/Hip Exercises: Seated   Long Arc Quad 3 sets;10 reps    Long Arc Quad Weight 5 lbs.    Sit to Sand 10 reps;without UE  support;2 sets   standard chair (not his walker)                      PT Short Term Goals - 05/21/21 1501       PT SHORT TERM GOAL #1   Title Patient will be independent in self management strategies to improve quality of life and functional outcomes.    Baseline Reports variable compliance    Time 3    Period Weeks  Status Partially Met    Target Date 04/27/21      PT SHORT TERM GOAL #2   Title Patient will be able to report at least 25% improvement in overall mobility.    Baseline Reports 50% improved    Time 3    Period Weeks    Status Achieved    Target Date 04/27/21      PT SHORT TERM GOAL #3   Title Demo improved balance and BLE strength as evidenced by time of 18 sec 5xSTS test    Baseline 13 seconds with use of UES, but uses heavy momentum use of UEs and does not fully extend knees    Time 3    Period Weeks    Status Partially Met    Target Date 04/27/21               PT Long Term Goals - 06/04/21 1202       PT LONG TERM GOAL #1   Title Patient will improve FOTO score by at least 10 points in order to indicate improved tolerance to activity.    Baseline 24% decrease    Time 6    Period Weeks    Status On-going      PT LONG TERM GOAL #2   Title Demo improved gait velocity and safety as evidenced by distance of 150 ft during 2MWT    Baseline 60 ft with rollator    Time 6    Period Weeks    Status On-going      PT LONG TERM GOAL #3   Title Patient will not have foot wound present to reduce risk of infection.    Time 4    Period Weeks    Target Date 07/02/21      PT LONG TERM GOAL #4   Title Patient will verbalize proper foot care techniques.    Time 4    Period Weeks    Status New    Target Date 07/02/21                   Plan - 06/16/21 1632     Clinical Impression Statement Continued established strengthening exercises.  Able to add extra set of 10 to all with 5# weights.  Worked on reducing UE assist completing  side stepping in parallel bars.  Noted weakness of Rt hip completing this task.  Added lunges with intermittent HHA, again much easier to complete with Lt forward weight shift vs Rt due to Rt hip weakness. Sit to stands completed from standard chair as walker seat is at a raised height.   Good stability upon standing and ability to control descent.  Pt required 3 short seated rest breaks during session today due to fatigue.    Personal Factors and Comorbidities Comorbidity 1;Comorbidity 3+;Comorbidity 2    Comorbidities CHF, left knee surgery, right chronic foot wound, DB, restless leg syndromes    Examination-Activity Limitations Bathing;Lift;Locomotion Level;Transfers;Stand;Stairs;Squat;Reach Overhead;Carry    Examination-Participation Restrictions Community Activity;Meal Prep    Stability/Clinical Decision Making Evolving/Moderate complexity    Rehab Potential Fair    PT Frequency 2x / week    PT Duration 4 weeks    PT Treatment/Interventions ADLs/Self Care Home Management;Other (comment);Moist Heat;Balance training;Therapeutic exercise;Therapeutic activities;Functional mobility training;Stair training;Gait training;DME Instruction;Ultrasound;Neuromuscular re-education;Patient/family education;Orthotic Fit/Training;Manual techniques;Taping;Passive range of motion;Electrical Stimulation    PT Next Visit Plan Continue LE strength, gait and balance as tolerated.  Rt hip much weaker than Lt; Next session add vectors and  tandem stance in parallel bars.    PT Home Exercise Plan hip abd/add isometric, QS, hamstring stretch;  04/15/21: Isometric abd, abd supine, SAQ, bridge 9/1 clamshell, sideyling hip abduciton 9/21 standing hip abduction/ extenison, knee flexion, heel raise/ toe raise    Consulted and Agree with Plan of Care Patient             Patient will benefit from skilled therapeutic intervention in order to improve the following deficits and impairments:  Abnormal gait, Decreased endurance,  Decreased skin integrity, Increased edema, Decreased activity tolerance, Decreased balance, Decreased mobility, Decreased knowledge of use of DME, Decreased strength, Difficulty walking, Pain, Decreased range of motion, Impaired flexibility, Postural dysfunction, Improper body mechanics  Visit Diagnosis: Muscle weakness (generalized)  Difficulty in walking, not elsewhere classified  Unsteadiness on feet     Problem List Patient Active Problem List   Diagnosis Date Noted   Hypokalemia 05/18/2021   Muscular deconditioning 05/12/2021   Frequent falls 05/12/2021   Fatigue 04/29/2021   Recent unexplained weight loss 04/23/2021   Left shoulder pain 04/23/2021   Chest tightness 04/23/2021   Deep tissue injury 04/23/2021   Myoclonus 04/15/2021   Pneumonia 04/03/2021   PNA (pneumonia) 04/02/2021   Abnormal finding on urinalysis 04/01/2021   Syncope 02/16/2021   Minor head injury    Right hip pain 02/11/2021   Erectile dysfunction 01/22/2021   Gait abnormality 09/30/2020   Mild non proliferative diabetic retinopathy ([place]) 07/16/2020   Need for immunization against influenza 06/03/2020   S/P left knee arthroscopy 05/06/20 05/13/2020   Abnormal MRI 03/26/2020   Joint disorder of knee 03/26/2020   Fall at home, initial encounter 03/13/2020   Anemia 09/03/2019   Reflux esophagitis 09/03/2019   Esophageal dysphagia 05/30/2019   Constipation 05/30/2019   Positive colorectal cancer screening using Cologuard test 05/30/2019   Diabetic foot ulcer ([place]) 09/06/2018   Orthostasis 08/25/2016   Congestive heart failure ([place]) 05/17/2016   Bilateral carotid artery disease ([place]) 07/08/2014   Obstructive sleep apnea 02/12/2014   Restless legs 10/23/2013   Overweight with body mass index (BMI) of 29 to 29.9 in adult 10/23/2013   Hyperlipidemia 07/16/2013   Peripheral arterial disease ([place]) 07/16/2013   Hypertension 03/14/2012   Type 2 diabetes mellitus with circulatory disorder ([place])  03/14/2012   Peripheral neuropathy 03/14/2012   Cardiomyopathy- EF NL 01/2013, now 30-35% echo 03/14/2012   Teena Irani, PTA/CLT [phone]  Teena Irani, PTA 06/16/2021, 4:34 PM  [facility] [address] [place], [place], [address] Phone: [phone]   Fax:  [phone]  Name: [name] MRN: [id number] Date of Birth: [birth year]

## 2021-06-18 ENCOUNTER — Encounter (HOSPITAL_COMMUNITY): Payer: HMO | Admitting: Physical Therapy

## 2021-06-18 ENCOUNTER — Encounter (HOSPITAL_COMMUNITY): Payer: Self-pay | Admitting: Physical Therapy

## 2021-06-18 ENCOUNTER — Ambulatory Visit (HOSPITAL_COMMUNITY): Payer: HMO | Admitting: Physical Therapy

## 2021-06-18 ENCOUNTER — Ambulatory Visit (HOSPITAL_COMMUNITY): Payer: HMO

## 2021-06-18 ENCOUNTER — Other Ambulatory Visit: Payer: Self-pay

## 2021-06-18 ENCOUNTER — Encounter (HOSPITAL_COMMUNITY): Payer: Self-pay

## 2021-06-18 DIAGNOSIS — M6281 Muscle weakness (generalized): Secondary | ICD-10-CM

## 2021-06-18 DIAGNOSIS — M25612 Stiffness of left shoulder, not elsewhere classified: Secondary | ICD-10-CM

## 2021-06-18 DIAGNOSIS — R29898 Other symptoms and signs involving the musculoskeletal system: Secondary | ICD-10-CM

## 2021-06-18 DIAGNOSIS — R262 Difficulty in walking, not elsewhere classified: Secondary | ICD-10-CM

## 2021-06-18 DIAGNOSIS — R2681 Unsteadiness on feet: Secondary | ICD-10-CM

## 2021-06-18 DIAGNOSIS — G8929 Other chronic pain: Secondary | ICD-10-CM

## 2021-06-18 NOTE — Therapy (Signed)
[place], [place], [address] Phone: [phone]   Fax:  [phone]  Occupational Therapy Treatment  Patient Details  Name: [name] MRN: [id number] Date of Birth: [birth year] Referring Provider (OT): Demetrius Revel, NP   Encounter Date: 06/18/2021   OT End of Session - 06/18/21 1314     Visit Number 9    Number of Visits 10    Date for OT Re-Evaluation 06/26/21    Authorization Type [employer]; $30 copay    Authorization Time Period no visit limit    Progress Note Due on Visit 10    OT Start Time 1310   Patient arrived on time and used bathroom prior to starting therapy session.   OT Stop Time 1343    OT Time Calculation (min) 33 min    Activity Tolerance Patient tolerated treatment well    Behavior During Therapy WFL for tasks assessed/performed             Past Medical History:  Diagnosis Date   Aftercare following surgery of the circulatory system, NEC 12/04/2013   Angina decubitus ([place]) 05/19/2016   Arrhythmia 05/20/2016   Arthritis    ARTHRITIS, RIGHT FOOT 06/26/2008   Qualifier: Diagnosis of  By: Aline Brochure MD, [name]     Cardiomyopathy- EF NL 01/2013, now 30-35% echo 03/14/2012   Carotid artery occlusion    left s/p CEA   Cellulitis of left foot 05/07/2018   CHF (congestive heart failure) (HCC)    Critical lower limb ischemia ([place]) 02/28/2014   Critical limb ischemia    Diabetes mellitus    Elevated troponin 04/11/2016   GERD (gastroesophageal reflux disease)    History of stroke June 2013 03/14/2012   HOH (hard of hearing)    Hypertension    Hypothyroidism    Nonhealing skin ulcer ([place]) 10/28/2014   Obstructive sleep apnea    Orthostatic hypotension    Osteomyelitis (HCC)    left great toe   Pain in the chest 05/17/2016   Peripheral arterial disease ([place]), s/p PTA x 2 RLE    nonhealing ulcers bilaterally on each great toe   Pneumonia    PONV (postoperative nausea and vomiting)     Pulmonary nodule 05/17/2016   Restless leg syndrome    Shortness of breath    Stroke [facility]) March 08, 2012   Toe osteomyelitis, left [facility])    Ulcer of great toe, left, with necrosis of bone ([place])    Wears dentures     Past Surgical History:  Procedure Laterality Date   AMPUTATION TOE Left 07/25/2018   Procedure: AMPUTATION TOE INTERPHALANGEAL HALLUX LEFT;  Surgeon: Evelina Bucy, DPM;  Location: [place];  Service: Podiatry;  Laterality: Left;   ANGIOPLASTY  02/28/14   diamond back orbital rotational atherectomy of Rt. tibial   BACK SURGERY     BONE BIOPSY Left 07/25/2018   Procedure: SUPERFICIAL BONE BIOPSY;  Surgeon: Evelina Bucy, DPM;  Location: [place];  Service: Podiatry;  Laterality: Left;   CARDIAC CATHETERIZATION N/A 05/19/2016   Procedure: Right/Left Heart Cath and Coronary Angiography;  Surgeon: Belva Crome, MD;  Location: [place] CV LAB;  Service: Cardiovascular;  Laterality: N/A;   CERVICAL FUSION     ENDARTERECTOMY Left 11/29/2013   Procedure: ENDARTERECTOMY CAROTID;  Surgeon: Serafina Mitchell, MD;  Location: [place];  Service: Vascular;  Laterality: Left;   ESOPHAGOGASTRODUODENOSCOPY  02/2010   Dr. Gala Romney: patient presented with food impaction,  schatzki ring with superimposed component of stricture with erosive reflux esophagitis, s/p disimpaction but dilation planned at later date    ESOPHAGOGASTRODUODENOSCOPY (EGD) WITH PROPOFOL N/A 08/13/2019   Dr. Gala Romney: Erosive reflux esophagitis with mild stricture and incidental Mallory-Weiss tear which precluded esophageal dilation.  Medium sized hiatal hernia.   ESOPHAGOGASTRODUODENOSCOPY (EGD) WITH PROPOFOL N/A 10/11/2019   Dr. Gala Romney: Esophageal stenosis status post dilation, moderate hiatal hernia   EYE SURGERY     FLEXIBLE SIGMOIDOSCOPY N/A 08/13/2019   Procedure: FLEXIBLE SIGMOIDOSCOPY;  Surgeon: [name], MD;  [facility];  Service: Endoscopy;  Laterality: N/A;  colonoscopy aboerted due to formed stool and  poor prep   FOOT SURGERY     KNEE ARTHROSCOPY WITH MEDIAL MENISECTOMY Left 05/06/2020   Procedure: KNEE ARTHROSCOPY WITH MEDIAL MENISCECTOMY AND LATERAL MENISCECTOMY;  Surgeon: [name], MD;  [facility];  Service: Orthopedics;  Laterality: Left;   Lower ext duplex doppler  03/14/14   Rt ABI 1.2   LOWER EXTREMITY ANGIOGRAM Bilateral 02/18/2014   Procedure: LOWER EXTREMITY ANGIOGRAM;  Surgeon: [name], MD;  Location: [facility] CATH LAB;  Service: Cardiovascular;  Laterality: Bilateral;   LOWER EXTREMITY ANGIOGRAM N/A 10/31/2014   Procedure: LOWER EXTREMITY ANGIOGRAM;  Surgeon: [name], MD;  Location: [facility] CATH LAB;  Service: Cardiovascular;  Laterality: N/A;   MALONEY DILATION N/A 10/11/2019   Procedure: Venia Minks DILATION;  Surgeon: [name], MD;  [facility];  Service: Endoscopy;  Laterality: N/A;   MULTIPLE TOOTH EXTRACTIONS     PV angiogram  02/18/2014   tibial vessel diseas bil.   SPINE SURGERY     tendon achillies lengthing and sesamoid      There were no vitals filed for this visit.       [facility] OT Assessment - 06/18/21 1310       Assessment   Medical Diagnosis Chronic left shoulder pain      Precautions   Precautions Fall                      OT Treatments/Exercises (OP) - 06/18/21 1310       Exercises   Exercises Shoulder      Shoulder Exercises: ROM/Strengthening   UBE (Upper Arm Bike) Level 5 2' forward 2' reverse   pace: 14.5-15.5   Over Head Lace seated. Pt unlaced tube from chain starting from the bottom of chain to approximately 3/4 of the way up.      Functional Reaching Activities   High Level High level reaching task completed while standing. Pt used LUE to place [place] on highest position of door. Removed with LUE as well.                      OT Short Term Goals - 05/29/21 1322       OT SHORT TERM GOAL #1   Title Pt will be provided with and educated on HEP to improve mobility of LUE  required during ADL completion.    Baseline 9/9: Pt will need continued education of tasks to increase L UE function. Pt reports moderate difficult with ADL tasks due to L UE.    Time 4    Period Weeks    Status On-going    Target Date 06/26/21      OT SHORT TERM GOAL #2   Title Pt will decrease pain in LUE to 3/10 or less to improve ability to sleep  for 3+ hours without waking due to pain.    Baseline 9/9: Pt does reported 3/10 pain but is not able to consistently sleep 3+ hours without waking due to pain. Pt reports that need to urnate due to water pills is also a contributing factor.    Time 4    Period Weeks    Status Partially Met      OT SHORT TERM GOAL #3   Title Pt will decrease LUE fascial restrictions to min amounts or less to improve ability to complete functional reaching tasks.    Baseline 9/9: Pt reports continued deficits in functional reaching.    Time 4    Period Weeks    Status On-going      OT SHORT TERM GOAL #4   Title Pt will increase LUE A/ROM to [facility] to improve ability to reach for clothes or items in closet.    Baseline 9/9: 9/9: Pt reports continued deficits in functional reaching overhead.    Time 4    Period Weeks    Status On-going      OT SHORT TERM GOAL #5   Title Pt will increase LUE strength to 4/5 or greater to improve ability to lift cast iron skillet during meal prep tasks.    Baseline 9/9: Pt is not yet meeting this goal due to being at 3/5 MMT or below for most shoulder movements.    Time 4    Period Weeks    Status On-going                      Plan - 06/18/21 1315     Clinical Impression Statement A: Focused session on functional reaching while standing in order to achieve the greatest available ROM. VC for fom and technique were provided. [facility] and has a meeting tomorrow with a trainer to establish an exercise program.    Body Structure / Function / Physical Skills  ADL;Endurance;UE functional use;Fascial restriction;Pain;ROM;IADL;Strength    Plan P: Complete 10th visit progress note/reassessment. FOTO. Determine how much more therapy is needed.    Consulted and Agree with Plan of Care Patient             Patient will benefit from skilled therapeutic intervention in order to improve the following deficits and impairments:   Body Structure / Function / Physical Skills: ADL, Endurance, UE functional use, Fascial restriction, Pain, ROM, IADL, Strength       Visit Diagnosis: Chronic left shoulder pain  Other symptoms and signs involving the musculoskeletal system  Stiffness of left shoulder, not elsewhere classified    Problem List Patient Active Problem List   Diagnosis Date Noted   Hypokalemia 05/18/2021   Muscular deconditioning 05/12/2021   Frequent falls 05/12/2021   Fatigue 04/29/2021   Recent unexplained weight loss 04/23/2021   Left shoulder pain 04/23/2021   Chest tightness 04/23/2021   Deep tissue injury 04/23/2021   Myoclonus 04/15/2021   Pneumonia 04/03/2021   PNA (pneumonia) 04/02/2021   Abnormal finding on urinalysis 04/01/2021   Syncope 02/16/2021   Minor head injury    Right hip pain 02/11/2021   Erectile dysfunction 01/22/2021   Gait abnormality 09/30/2020   Mild non proliferative diabetic retinopathy ([place]) 07/16/2020   Need for immunization against influenza 06/03/2020   S/P left knee arthroscopy 05/06/20 05/13/2020   Abnormal MRI 03/26/2020   Joint disorder of knee 03/26/2020   Fall at  home, initial encounter 03/13/2020   Anemia 09/03/2019   Reflux esophagitis 09/03/2019   Esophageal dysphagia 05/30/2019   Constipation 05/30/2019   Positive colorectal cancer screening using Cologuard test 05/30/2019   Diabetic foot ulcer ([place]) 09/06/2018   Orthostasis 08/25/2016   Congestive heart failure ([place]) 05/17/2016   Bilateral carotid artery disease ([place]) 07/08/2014   Obstructive sleep apnea 02/12/2014   Restless  legs 10/23/2013   Overweight with body mass index (BMI) of 29 to 29.9 in adult 10/23/2013   Hyperlipidemia 07/16/2013   Peripheral arterial disease ([place]) 07/16/2013   Hypertension 03/14/2012   Type 2 diabetes mellitus with circulatory disorder ([place]) 03/14/2012   Peripheral neuropathy 03/14/2012   Cardiomyopathy- EF NL 01/2013, now 30-35% echo 03/14/2012    Ailene Ravel, OTR/L,CBIS  [phone]  06/18/2021, 3:02 PM  [place], [place], [address] Phone: [phone]   Fax:  [phone]  Name: [name] MRN: [id number] Date of Birth: 1946/09/01

## 2021-06-18 NOTE — Therapy (Signed)
[place] [address] [place], [place], [address] Phone: [phone]   Fax:  [phone]  Physical Therapy Treatment  Patient Details  Name: [name] MRN: [id number] Date of Birth: 12-05-1945 Referring Provider (PT): Noreene Larsson, NP   Encounter Date: 06/18/2021   PT End of Session - 06/18/21 1555     Visit Number 15    Number of Visits 26    Date for PT Re-Evaluation 07/02/21    Authorization Type [employer], no VL, no auth ,    Progress Note Due on Visit 20    PT Start Time 4235    PT Stop Time 3614    PT Time Calculation (min) 45 min    Equipment Utilized During Treatment Gait belt    Activity Tolerance Patient tolerated treatment well    Behavior During Therapy [facility] for tasks assessed/performed             Past Medical History:  Diagnosis Date   Aftercare following surgery of the circulatory system, NEC 12/04/2013   Angina decubitus ([place]) 05/19/2016   Arrhythmia 05/20/2016   Arthritis    ARTHRITIS, RIGHT FOOT 06/26/2008   Qualifier: Diagnosis of  By: Aline Brochure MD, [name]     Cardiomyopathy- EF NL 01/2013, now 30-35% echo 03/14/2012   Carotid artery occlusion    left s/p CEA   Cellulitis of left foot 05/07/2018   CHF (congestive heart failure) ([place])    Critical lower limb ischemia ([place]) 02/28/2014   Critical limb ischemia    Diabetes mellitus    Elevated troponin 04/11/2016   GERD (gastroesophageal reflux disease)    History of stroke June 2013 03/14/2012   HOH (hard of hearing)    Hypertension    Hypothyroidism    Nonhealing skin ulcer ([place]) 10/28/2014   Obstructive sleep apnea    Orthostatic hypotension    Osteomyelitis ([place])    left great toe   Pain in the chest 05/17/2016   Peripheral arterial disease ([place]), s/p PTA x 2 RLE    nonhealing ulcers bilaterally on each great toe   Pneumonia    PONV (postoperative nausea and vomiting)    Pulmonary nodule 05/17/2016   Restless leg syndrome     Shortness of breath    Stroke [facility]) March 08, 2012   Toe osteomyelitis, left [facility])    Ulcer of great toe, left, with necrosis of bone ([place])    Wears dentures     Past Surgical History:  Procedure Laterality Date   AMPUTATION TOE Left 07/25/2018   Procedure: AMPUTATION TOE INTERPHALANGEAL HALLUX LEFT;  Surgeon: Evelina Bucy, DPM;  Location: [place];  Service: Podiatry;  Laterality: Left;   ANGIOPLASTY  02/28/14   diamond back orbital rotational atherectomy of Rt. tibial   BACK SURGERY     BONE BIOPSY Left 07/25/2018   Procedure: SUPERFICIAL BONE BIOPSY;  Surgeon: Evelina Bucy, DPM;  Location: [place];  Service: Podiatry;  Laterality: Left;   CARDIAC CATHETERIZATION N/A 05/19/2016   Procedure: Right/Left Heart Cath and Coronary Angiography;  Surgeon: Belva Crome, MD;  Location: [place] [facility] LAB;  Service: Cardiovascular;  Laterality: N/A;   CERVICAL FUSION     ENDARTERECTOMY Left 11/29/2013   Procedure: ENDARTERECTOMY CAROTID;  Surgeon: Serafina Mitchell, MD;  Location: [place];  Service: Vascular;  Laterality: Left;   ESOPHAGOGASTRODUODENOSCOPY  02/2010   Dr. Gala Romney: patient presented with food impaction, schatzki ring with superimposed component of stricture with erosive  reflux esophagitis, s/p disimpaction but dilation planned at later date    ESOPHAGOGASTRODUODENOSCOPY (EGD) WITH PROPOFOL N/A 08/13/2019   Dr. Gala Romney: Erosive reflux esophagitis with mild stricture and incidental Mallory-Weiss tear which precluded esophageal dilation.  Medium sized hiatal hernia.   ESOPHAGOGASTRODUODENOSCOPY (EGD) WITH PROPOFOL N/A 10/11/2019   Dr. Gala Romney: Esophageal stenosis status post dilation, moderate hiatal hernia   EYE SURGERY     FLEXIBLE SIGMOIDOSCOPY N/A 08/13/2019   Procedure: FLEXIBLE SIGMOIDOSCOPY;  Surgeon: [name], MD;  [facility];  Service: Endoscopy;  Laterality: N/A;  colonoscopy aboerted due to formed stool and poor prep   FOOT SURGERY     KNEE ARTHROSCOPY WITH MEDIAL  MENISECTOMY Left 05/06/2020   Procedure: KNEE ARTHROSCOPY WITH MEDIAL MENISCECTOMY AND LATERAL MENISCECTOMY;  Surgeon: [name], MD;  [facility];  Service: Orthopedics;  Laterality: Left;   Lower ext duplex doppler  03/14/14   Rt ABI 1.2   LOWER EXTREMITY ANGIOGRAM Bilateral 02/18/2014   Procedure: LOWER EXTREMITY ANGIOGRAM;  Surgeon: [name], MD;  Location: [facility] C[facility];  Service: Cardiovascular;  Laterality: Bilateral;   LOWER EXTREMITY ANGIOGRAM N/A 10/31/2014   Procedure: LOWER EXTREMITY ANGIOGRAM;  Surgeon: [name], MD;  Location: [facility] CATH LAB;  Service: Cardiovascular;  Laterality: N/A;   MALONEY DILATION N/A 10/11/2019   Procedure: Venia Minks DILATION;  Surgeon: [name], MD;  [facility];  Service: Endoscopy;  Laterality: N/A;   MULTIPLE TOOTH EXTRACTIONS     PV angiogram  02/18/2014   tibial vessel diseas bil.   SPINE SURGERY     tendon achillies lengthing and sesamoid      There were no vitals filed for this visit.   Subjective Assessment - 06/18/21 1451     Subjective Pt has no complaint of pain    Pertinent History CHF , R foot wound, left knee scope, DB, Restless leg syndrome, hx of stroke.    Limitations House hold activities;Standing;Lifting    How long can you walk comfortably? 10    Patient Stated Goals be able to walk normally and function correctly.    Currently in Pain? No/denies                               [facility] Adult PT Treatment/Exercise - 06/18/21 1452       Exercises   Exercises Knee/Hip      Knee/Hip Exercises: Standing   Terminal Knee Extension Both;10 reps    Terminal Knee Extension Limitations at wall    Hip Abduction Both;1 set;15 reps    Abduction Limitations 5# foot hits ground and pushes back    Hip Extension Both;15 reps    Extension Limitations toe taps back    Lateral Step Up Both;15 reps    Lateral Step Up Limitations with 4 leg swings at each rep    Wall Squat 10 reps     Other Standing Knee Exercises side stepping x 3RT with 1 UE only in // bars      Knee/Hip Exercises: Seated   Sit to Sand 10 reps;without UE support;2 sets   standard chair (not his walker)                      PT Short Term Goals - 05/21/21 1501       PT SHORT TERM GOAL #1   Title Patient will be independent in self management  strategies to improve quality of life and functional outcomes.    Baseline Reports variable compliance    Time 3    Period Weeks    Status Partially Met    Target Date 04/27/21      PT SHORT TERM GOAL #2   Title Patient will be able to report at least 25% improvement in overall mobility.    Baseline Reports 50% improved    Time 3    Period Weeks    Status Achieved    Target Date 04/27/21      PT SHORT TERM GOAL #3   Title Demo improved balance and BLE strength as evidenced by time of 18 sec 5xSTS test    Baseline 13 seconds with use of UES, but uses heavy momentum use of UEs and does not fully extend knees    Time 3    Period Weeks    Status Partially Met    Target Date 04/27/21               PT Long Term Goals - 06/04/21 1202       PT LONG TERM GOAL #1   Title Patient will improve FOTO score by at least 10 points in order to indicate improved tolerance to activity.    Baseline 24% decrease    Time 6    Period Weeks    Status On-going      PT LONG TERM GOAL #2   Title Demo improved gait velocity and safety as evidenced by distance of 150 ft during 2MWT    Baseline 60 ft with rollator    Time 6    Period Weeks    Status On-going      PT LONG TERM GOAL #3   Title Patient will not have foot wound present to reduce risk of infection.    Time 4    Period Weeks    Target Date 07/02/21      PT LONG TERM GOAL #4   Title Patient will verbalize proper foot care techniques.    Time 4    Period Weeks    Status New    Target Date 07/02/21                   Plan - 06/18/21 1556     Clinical Impression Statement  Noted that pt is ambulating with a bit more room between his feet.   Added standing terminal extension to imporve gait as pt ambulates with both knees flexed.  Added lateral step ups with hip hike and leg swing to continue to improve strength of hip abductors.  Tandem stance with head turns added for balance.    Personal Factors and Comorbidities Comorbidity 1;Comorbidity 3+;Comorbidity 2    Comorbidities CHF, left knee surgery, right chronic foot wound, DB, restless leg syndromes    Examination-Activity Limitations Bathing;Lift;Locomotion Level;Transfers;Stand;Stairs;Squat;Reach Overhead;Carry    Examination-Participation Restrictions Community Activity;Meal Prep    Stability/Clinical Decision Making Evolving/Moderate complexity    Rehab Potential Fair    PT Frequency 2x / week    PT Duration 4 weeks    PT Treatment/Interventions ADLs/Self Care Home Management;Other (comment);Moist Heat;Balance training;Therapeutic exercise;Therapeutic activities;Functional mobility training;Stair training;Gait training;DME Instruction;Ultrasound;Neuromuscular re-education;Patient/family education;Orthotic Fit/Training;Manual techniques;Taping;Passive range of motion;Electrical Stimulation    PT Next Visit Plan Continue LE strength, gait and balance as tolerated.  Rt hip much weaker than Lt; Next session add vectors    PT Home Exercise Plan hip abd/add isometric, QS, hamstring stretch;  04/15/21: Isometric  abd, abd supine, SAQ, bridge 9/1 clamshell, sideyling hip abduciton 9/21 standing hip abduction/ extenison, knee flexion, heel raise/ toe raise    Consulted and Agree with Plan of Care Patient             Patient will benefit from skilled therapeutic intervention in order to improve the following deficits and impairments:  Abnormal gait, Decreased endurance, Decreased skin integrity, Increased edema, Decreased activity tolerance, Decreased balance, Decreased mobility, Decreased knowledge of use of DME,  Decreased strength, Difficulty walking, Pain, Decreased range of motion, Impaired flexibility, Postural dysfunction, Improper body mechanics  Visit Diagnosis: Muscle weakness (generalized)  Difficulty in walking, not elsewhere classified  Unsteadiness on feet     Problem List Patient Active Problem List   Diagnosis Date Noted   Hypokalemia 05/18/2021   Muscular deconditioning 05/12/2021   Frequent falls 05/12/2021   Fatigue 04/29/2021   Recent unexplained weight loss 04/23/2021   Left shoulder pain 04/23/2021   Chest tightness 04/23/2021   Deep tissue injury 04/23/2021   Myoclonus 04/15/2021   Pneumonia 04/03/2021   PNA (pneumonia) 04/02/2021   Abnormal finding on urinalysis 04/01/2021   Syncope 02/16/2021   Minor head injury    Right hip pain 02/11/2021   Erectile dysfunction 01/22/2021   Gait abnormality 09/30/2020   Mild non proliferative diabetic retinopathy ([place]) 07/16/2020   Need for immunization against influenza 06/03/2020   S/P left knee arthroscopy 05/06/20 05/13/2020   Abnormal MRI 03/26/2020   Joint disorder of knee 03/26/2020   Fall at home, initial encounter 03/13/2020   Anemia 09/03/2019   Reflux esophagitis 09/03/2019   Esophageal dysphagia 05/30/2019   Constipation 05/30/2019   Positive colorectal cancer screening using Cologuard test 05/30/2019   Diabetic foot ulcer ([place]) 09/06/2018   Orthostasis 08/25/2016   Congestive heart failure ([place]) 05/17/2016   Bilateral carotid artery disease ([place]) 07/08/2014   Obstructive sleep apnea 02/12/2014   Restless legs 10/23/2013   Overweight with body mass index (BMI) of 29 to 29.9 in adult 10/23/2013   Hyperlipidemia 07/16/2013   Peripheral arterial disease ([place]) 07/16/2013   Hypertension 03/14/2012   Type 2 diabetes mellitus with circulatory disorder ([place]) 03/14/2012   Peripheral neuropathy 03/14/2012   Cardiomyopathy- EF NL 01/2013, now 30-35% echo 03/14/2012   Rayetta Humphrey, PT CLT [phone]   06/18/2021, 4:00 PM  [facility] [address] [place], [place], [address] Phone: [phone]   Fax:  [phone]  Name: [name] MRN: [id number] Date of Birth: 1945/12/30

## 2021-06-22 ENCOUNTER — Encounter (HOSPITAL_COMMUNITY): Payer: HMO | Admitting: Physical Therapy

## 2021-06-22 ENCOUNTER — Other Ambulatory Visit: Payer: Self-pay

## 2021-06-22 ENCOUNTER — Ambulatory Visit (HOSPITAL_COMMUNITY): Payer: HMO | Attending: Family Medicine | Admitting: Physical Therapy

## 2021-06-22 ENCOUNTER — Encounter (HOSPITAL_COMMUNITY): Payer: Self-pay

## 2021-06-22 ENCOUNTER — Encounter (HOSPITAL_COMMUNITY): Payer: Self-pay | Admitting: Physical Therapy

## 2021-06-22 ENCOUNTER — Ambulatory Visit (HOSPITAL_COMMUNITY): Payer: HMO

## 2021-06-22 DIAGNOSIS — M6281 Muscle weakness (generalized): Secondary | ICD-10-CM | POA: Diagnosis not present

## 2021-06-22 DIAGNOSIS — R29898 Other symptoms and signs involving the musculoskeletal system: Secondary | ICD-10-CM

## 2021-06-22 DIAGNOSIS — R2681 Unsteadiness on feet: Secondary | ICD-10-CM | POA: Diagnosis not present

## 2021-06-22 DIAGNOSIS — M25612 Stiffness of left shoulder, not elsewhere classified: Secondary | ICD-10-CM | POA: Diagnosis not present

## 2021-06-22 DIAGNOSIS — R262 Difficulty in walking, not elsewhere classified: Secondary | ICD-10-CM | POA: Insufficient documentation

## 2021-06-22 DIAGNOSIS — G8929 Other chronic pain: Secondary | ICD-10-CM | POA: Diagnosis not present

## 2021-06-22 DIAGNOSIS — M25512 Pain in left shoulder: Secondary | ICD-10-CM

## 2021-06-22 NOTE — Patient Instructions (Signed)
Complete the following exercises 2-3 times a day.  Doorway Stretch  Place each hand opposite each other on the doorway. (You can change where you feel the stretch by moving arms higher or lower.) Step through with one foot and bend front knee until a stretch is felt and hold. Step through with the opposite foot on the next rep. Hold for __10-15___ seconds. Repeat __2__times.      Wall Flexion  Slide your arm up the wall or door frame until a stretch is felt in your shoulder . Hold for 10-15 seconds. Complete 2 times

## 2021-06-22 NOTE — Therapy (Signed)
[facility] [address] [place], [place], [address] Phone: [phone]   Fax:  [phone]  Physical Therapy Treatment  Patient Details  Name: [name] MRN: [id number] Date of Birth: May 10, 1946 Referring Provider (PT): Noreene Larsson, NP   Encounter Date: 06/22/2021   PT End of Session - 06/22/21 1359     Visit Number 16    Number of Visits 26    Date for PT Re-Evaluation 07/02/21    Authorization Type [employer], no VL, no auth ,    Progress Note Due on Visit 20    PT Start Time 1400    PT Stop Time 1440    PT Time Calculation (min) 40 min    Equipment Utilized During Treatment Gait belt    Activity Tolerance Patient tolerated treatment well    Behavior During Therapy [facility] for tasks assessed/performed             Past Medical History:  Diagnosis Date   Aftercare following surgery of the circulatory system, NEC 12/04/2013   Angina decubitus ([place]) 05/19/2016   Arrhythmia 05/20/2016   Arthritis    ARTHRITIS, RIGHT FOOT 06/26/2008   Qualifier: Diagnosis of  By: Aline Brochure MD, [name]     Cardiomyopathy- EF NL 01/2013, now 30-35% echo 03/14/2012   Carotid artery occlusion    left s/p CEA   Cellulitis of left foot 05/07/2018   CHF (congestive heart failure) ([place])    Critical lower limb ischemia ([employer]) 02/28/2014   Critical limb ischemia    Diabetes mellitus    Elevated troponin 04/11/2016   GERD (gastroesophageal reflux disease)    History of stroke June 2013 03/14/2012   HOH (hard of hearing)    Hypertension    Hypothyroidism    Nonhealing skin ulcer ([place]) 10/28/2014   Obstructive sleep apnea    Orthostatic hypotension    Osteomyelitis ([place])    left great toe   Pain in the chest 05/17/2016   Peripheral arterial disease ([place]), s/p PTA x 2 RLE    nonhealing ulcers bilaterally on each great toe   Pneumonia    PONV (postoperative nausea and vomiting)    Pulmonary nodule 05/17/2016   Restless leg syndrome     Shortness of breath    Stroke [facility]) March 08, 2012   Toe osteomyelitis, left [facility])    Ulcer of great toe, left, with necrosis of bone ([place])    Wears dentures     Past Surgical History:  Procedure Laterality Date   AMPUTATION TOE Left 07/25/2018   Procedure: AMPUTATION TOE INTERPHALANGEAL HALLUX LEFT;  Surgeon: Evelina Bucy, DPM;  Location: [place];  Service: Podiatry;  Laterality: Left;   ANGIOPLASTY  02/28/14   diamond back orbital rotational atherectomy of Rt. tibial   BACK SURGERY     BONE BIOPSY Left 07/25/2018   Procedure: SUPERFICIAL BONE BIOPSY;  Surgeon: Evelina Bucy, DPM;  Location: [place];  Service: Podiatry;  Laterality: Left;   CARDIAC CATHETERIZATION N/A 05/19/2016   Procedure: Right/Left Heart Cath and Coronary Angiography;  Surgeon: Belva Crome, MD;  Location: [place] CV LAB;  Service: Cardiovascular;  Laterality: N/A;   CERVICAL FUSION     ENDARTERECTOMY Left 11/29/2013   Procedure: ENDARTERECTOMY CAROTID;  Surgeon: Serafina Mitchell, MD;  Location: [place];  Service: Vascular;  Laterality: Left;   ESOPHAGOGASTRODUODENOSCOPY  02/2010   Dr. Gala Romney: patient presented with food impaction, schatzki ring with superimposed component of stricture with erosive  reflux esophagitis, s/p disimpaction but dilation planned at later date    ESOPHAGOGASTRODUODENOSCOPY (EGD) WITH PROPOFOL N/A 08/13/2019   Dr. Gala Romney: Erosive reflux esophagitis with mild stricture and incidental Mallory-Weiss tear which precluded esophageal dilation.  Medium sized hiatal hernia.   ESOPHAGOGASTRODUODENOSCOPY (EGD) WITH PROPOFOL N/A 10/11/2019   Dr. Gala Romney: Esophageal stenosis status post dilation, moderate hiatal hernia   EYE SURGERY     FLEXIBLE SIGMOIDOSCOPY N/A 08/13/2019   Procedure: FLEXIBLE SIGMOIDOSCOPY;  Surgeon: [name], MD;  [facility];  Service: Endoscopy;  Laterality: N/A;  colonoscopy aboerted due to formed stool and poor prep   FOOT SURGERY     KNEE ARTHROSCOPY WITH MEDIAL  MENISECTOMY Left 05/06/2020   Procedure: KNEE ARTHROSCOPY WITH MEDIAL MENISCECTOMY AND LATERAL MENISCECTOMY;  Surgeon: [name], MD;  [facility];  Service: Orthopedics;  Laterality: Left;   Lower ext duplex doppler  03/14/14   Rt ABI 1.2   LOWER EXTREMITY ANGIOGRAM Bilateral 02/18/2014   Procedure: LOWER EXTREMITY ANGIOGRAM;  Surgeon: [name], MD;  [facility]: [facility] CATH LAB;  Service: Cardiovascular;  Laterality: Bilateral;   LOWER EXTREMITY ANGIOGRAM N/A 10/31/2014   Procedure: LOWER EXTREMITY ANGIOGRAM;  Surgeon: [name], MD;  Location: [facility] CATH LAB;  Service: Cardiovascular;  Laterality: N/A;   MALONEY DILATION N/A 10/11/2019   Procedure: Venia Minks DILATION;  Surgeon: [name], MD;  [facility];  Service: Endoscopy;  Laterality: N/A;   MULTIPLE TOOTH EXTRACTIONS     PV angiogram  02/18/2014   tibial vessel diseas bil.   SPINE SURGERY     tendon achillies lengthing and sesamoid      There were no vitals filed for this visit.   Subjective Assessment - 06/22/21 1400     Subjective Legs have been doing alright. feeling better after protein bar. HEP has been fair.    Pertinent History CHF , R foot wound, left knee scope, DB, Restless leg syndrome, hx of stroke.    Limitations House hold activities;Standing;Lifting    How long can you walk comfortably? 10    Patient Stated Goals be able to walk normally and function correctly.    Currently in Pain? No/denies                               [facility] Adult PT Treatment/Exercise - 06/22/21 1403       Knee/Hip Exercises: Standing   Hip Flexion Both;3 sets;10 reps    Hip Flexion Limitations 5lb    Hip Abduction Both;3 sets;10 reps    Abduction Limitations 5# foot hits ground and pushes back    Hip Extension Both;3 sets;10 reps    Extension Limitations 5# tap backs    Lateral Step Up Both;1 set;10 reps    Functional Squat 10 reps;2 sets    Other Standing Knee Exercises side  stepping x 5RT with 1 UE only in // bars                     PT Education - 06/22/21 1400     Education Details HEP    Person(s) Educated Patient    Methods Explanation    Comprehension Verbalized understanding              PT Short Term Goals - 05/21/21 1501       PT SHORT TERM GOAL #1   Title Patient will be independent in self  management strategies to improve quality of life and functional outcomes.    Baseline Reports variable compliance    Time 3    Period Weeks    Status Partially Met    Target Date 04/27/21      PT SHORT TERM GOAL #2   Title Patient will be able to report at least 25% improvement in overall mobility.    Baseline Reports 50% improved    Time 3    Period Weeks    Status Achieved    Target Date 04/27/21      PT SHORT TERM GOAL #3   Title Demo improved balance and BLE strength as evidenced by time of 18 sec 5xSTS test    Baseline 13 seconds with use of UES, but uses heavy momentum use of UEs and does not fully extend knees    Time 3    Period Weeks    Status Partially Met    Target Date 04/27/21               PT Long Term Goals - 06/04/21 1202       PT LONG TERM GOAL #1   Title Patient will improve FOTO score by at least 10 points in order to indicate improved tolerance to activity.    Baseline 24% decrease    Time 6    Period Weeks    Status On-going      PT LONG TERM GOAL #2   Title Demo improved gait velocity and safety as evidenced by distance of 150 ft during 2MWT    Baseline 60 ft with rollator    Time 6    Period Weeks    Status On-going      PT LONG TERM GOAL #3   Title Patient will not have foot wound present to reduce risk of infection.    Time 4    Period Weeks    Target Date 07/02/21      PT LONG TERM GOAL #4   Title Patient will verbalize proper foot care techniques.    Time 4    Period Weeks    Status New    Target Date 07/02/21                   Plan - 06/22/21 1359     Clinical  Impression Statement Continued with hip strengthening which patient tolerates well. Patient given cueing for controlled movements for emphasis on glute activation and strength. Patient tolerating increased reps of previously completed exercises. Good squat mechanics with unilateral UE support. Patient will continue to benefit from skilled physical therapy in order to reduce impairment and improve function.    Personal Factors and Comorbidities Comorbidity 1;Comorbidity 3+;Comorbidity 2    Comorbidities CHF, left knee surgery, right chronic foot wound, DB, restless leg syndromes    Examination-Activity Limitations Bathing;Lift;Locomotion Level;Transfers;Stand;Stairs;Squat;Reach Overhead;Carry    Examination-Participation Restrictions Community Activity;Meal Prep    Stability/Clinical Decision Making Evolving/Moderate complexity    Rehab Potential Fair    PT Frequency 2x / week    PT Duration 4 weeks    PT Treatment/Interventions ADLs/Self Care Home Management;Other (comment);Moist Heat;Balance training;Therapeutic exercise;Therapeutic activities;Functional mobility training;Stair training;Gait training;DME Instruction;Ultrasound;Neuromuscular re-education;Patient/family education;Orthotic Fit/Training;Manual techniques;Taping;Passive range of motion;Electrical Stimulation    PT Next Visit Plan Continue LE strength, gait and balance as tolerated.  Rt hip much weaker than Lt; Next session add vectors    PT Home Exercise Plan hip abd/add isometric, QS, hamstring stretch;  04/15/21: Isometric abd, abd supine,  SAQ, bridge 9/1 clamshell, sideyling hip abduciton 9/21 standing hip abduction/ extenison, knee flexion, heel raise/ toe raise    Consulted and Agree with Plan of Care Patient             Patient will benefit from skilled therapeutic intervention in order to improve the following deficits and impairments:  Abnormal gait, Decreased endurance, Decreased skin integrity, Increased edema, Decreased  activity tolerance, Decreased balance, Decreased mobility, Decreased knowledge of use of DME, Decreased strength, Difficulty walking, Pain, Decreased range of motion, Impaired flexibility, Postural dysfunction, Improper body mechanics  Visit Diagnosis: Muscle weakness (generalized)  Difficulty in walking, not elsewhere classified  Unsteadiness on feet     Problem List Patient Active Problem List   Diagnosis Date Noted   Hypokalemia 05/18/2021   Muscular deconditioning 05/12/2021   Frequent falls 05/12/2021   Fatigue 04/29/2021   Recent unexplained weight loss 04/23/2021   Left shoulder pain 04/23/2021   Chest tightness 04/23/2021   Deep tissue injury 04/23/2021   Myoclonus 04/15/2021   Pneumonia 04/03/2021   PNA (pneumonia) 04/02/2021   Abnormal finding on urinalysis 04/01/2021   Syncope 02/16/2021   Minor head injury    Right hip pain 02/11/2021   Erectile dysfunction 01/22/2021   Gait abnormality 09/30/2020   Mild non proliferative diabetic retinopathy ([place]) 07/16/2020   Need for immunization against influenza 06/03/2020   S/P left knee arthroscopy 05/06/20 05/13/2020   Abnormal MRI 03/26/2020   Joint disorder of knee 03/26/2020   Fall at home, initial encounter 03/13/2020   Anemia 09/03/2019   Reflux esophagitis 09/03/2019   Esophageal dysphagia 05/30/2019   Constipation 05/30/2019   Positive colorectal cancer screening using Cologuard test 05/30/2019   Diabetic foot ulcer ([place]) 09/06/2018   Orthostasis 08/25/2016   Congestive heart failure ([place]) 05/17/2016   Bilateral carotid artery disease ([place]) 07/08/2014   Obstructive sleep apnea 02/12/2014   Restless legs 10/23/2013   Overweight with body mass index (BMI) of 29 to 29.9 in adult 10/23/2013   Hyperlipidemia 07/16/2013   Peripheral arterial disease ([place]) 07/16/2013   Hypertension 03/14/2012   Type 2 diabetes mellitus with circulatory disorder ([place]) 03/14/2012   Peripheral neuropathy 03/14/2012    Cardiomyopathy- EF NL 01/2013, now 30-35% echo 03/14/2012    2:37 PM, 06/22/21 Mearl Latin PT, DPT Physical Therapist at [facility], [place], [address] Phone: [phone]   Fax:  [phone]  Name: [name] MRN: [id number] Date of Birth: 1946/08/21

## 2021-06-23 NOTE — Therapy (Signed)
[facility] [address] [place], [place], [address] Phone: [phone]   Fax:  [phone]  Occupational Therapy Treatment Reassessment/re-cert  Patient Details  Name: [name] MRN: [id number] Date of Birth: 11/23/1945 Referring Provider (OT): Demetrius Revel, NP   Encounter Date: 06/22/2021   OT End of Session - 06/22/21 1321     Visit Number 10    Number of Visits 16    Date for OT Re-Evaluation 07/13/21    Authorization Type [employer]; $30 copay    Authorization Time Period no visit limit    Progress Note Due on Visit 20    OT Start Time 1300   reassessment/re-cert   OT Stop Time 1601    OT Time Calculation (min) 38 min    Activity Tolerance Patient tolerated treatment well    Behavior During Therapy [facility] for tasks assessed/performed             Past Medical History:  Diagnosis Date   Aftercare following surgery of the circulatory system, NEC 12/04/2013   Angina decubitus ([place]) 05/19/2016   Arrhythmia 05/20/2016   Arthritis    ARTHRITIS, RIGHT FOOT 06/26/2008   Qualifier: Diagnosis of  By: Aline Brochure MD, [name]     Cardiomyopathy- EF NL 01/2013, now 30-35% echo 03/14/2012   Carotid artery occlusion    left s/p CEA   Cellulitis of left foot 05/07/2018   CHF (congestive heart failure) ([place])    Critical lower limb ischemia ([place]) 02/28/2014   Critical limb ischemia    Diabetes mellitus    Elevated troponin 04/11/2016   GERD (gastroesophageal reflux disease)    History of stroke June 2013 03/14/2012   HOH (hard of hearing)    Hypertension    Hypothyroidism    Nonhealing skin ulcer ([place]) 10/28/2014   Obstructive sleep apnea    Orthostatic hypotension    Osteomyelitis ([place])    left great toe   Pain in the chest 05/17/2016   Peripheral arterial disease ([place]), s/p PTA x 2 RLE    nonhealing ulcers bilaterally on each great toe   Pneumonia    PONV (postoperative nausea and vomiting)    Pulmonary nodule 05/17/2016    Restless leg syndrome    Shortness of breath    Stroke [facility]) March 08, 2012   Toe osteomyelitis, left [facility])    Ulcer of great toe, left, with necrosis of bone ([place])    Wears dentures     Past Surgical History:  Procedure Laterality Date   AMPUTATION TOE Left 07/25/2018   Procedure: AMPUTATION TOE INTERPHALANGEAL HALLUX LEFT;  Surgeon: Evelina Bucy, DPM;  Location: [place];  Service: Podiatry;  Laterality: Left;   ANGIOPLASTY  02/28/14   diamond back orbital rotational atherectomy of Rt. tibial   BACK SURGERY     BONE BIOPSY Left 07/25/2018   Procedure: SUPERFICIAL BONE BIOPSY;  Surgeon: Evelina Bucy, DPM;  Location: [place];  Service: Podiatry;  Laterality: Left;   CARDIAC CATHETERIZATION N/A 05/19/2016   Procedure: Right/Left Heart Cath and Coronary Angiography;  Surgeon: Belva Crome, MD;  Location: [place] CV LAB;  Service: Cardiovascular;  Laterality: N/A;   CERVICAL FUSION     ENDARTERECTOMY Left 11/29/2013   Procedure: ENDARTERECTOMY CAROTID;  Surgeon: Serafina Mitchell, MD;  Location: [place];  Service: Vascular;  Laterality: Left;   ESOPHAGOGASTRODUODENOSCOPY  02/2010   Dr. Gala Romney: patient presented with food impaction, schatzki ring with superimposed component of stricture with erosive reflux  esophagitis, s/p disimpaction but dilation planned at later date    ESOPHAGOGASTRODUODENOSCOPY (EGD) WITH PROPOFOL N/A 08/13/2019   Dr. Gala Romney: Erosive reflux esophagitis with mild stricture and incidental Mallory-Weiss tear which precluded esophageal dilation.  Medium sized hiatal hernia.   ESOPHAGOGASTRODUODENOSCOPY (EGD) WITH PROPOFOL N/A 10/11/2019   Dr. Gala Romney: Esophageal stenosis status post dilation, moderate hiatal hernia   EYE SURGERY     FLEXIBLE SIGMOIDOSCOPY N/A 08/13/2019   Procedure: FLEXIBLE SIGMOIDOSCOPY;  Surgeon: [name], MD;  [facility];  Service: Endoscopy;  Laterality: N/A;  colonoscopy aboerted due to formed stool and poor prep   FOOT SURGERY      KNEE ARTHROSCOPY WITH MEDIAL MENISECTOMY Left 05/06/2020   Procedure: KNEE ARTHROSCOPY WITH MEDIAL MENISCECTOMY AND LATERAL MENISCECTOMY;  Surgeon: [name], MD;  [facility];  Service: Orthopedics;  Laterality: Left;   Lower ext duplex doppler  03/14/14   Rt ABI 1.2   LOWER EXTREMITY ANGIOGRAM Bilateral 02/18/2014   Procedure: LOWER EXTREMITY ANGIOGRAM;  Surgeon: [name], MD;  Location: [facility] CATH LAB;  Service: Cardiovascular;  Laterality: Bilateral;   LOWER EXTREMITY ANGIOGRAM N/A 10/31/2014   Procedure: LOWER EXTREMITY ANGIOGRAM;  Surgeon: [name], MD;  Location: [facility] CATH LAB;  Service: Cardiovascular;  Laterality: N/A;   MALONEY DILATION N/A 10/11/2019   Procedure: Venia Minks DILATION;  Surgeon: [name], MD;  [facility];  Service: Endoscopy;  Laterality: N/A;   MULTIPLE TOOTH EXTRACTIONS     PV angiogram  02/18/2014   tibial vessel diseas bil.   SPINE SURGERY     tendon achillies lengthing and sesamoid      There were no vitals filed for this visit.   Subjective Assessment - 06/22/21 1306     Subjective  S: It's feeling much better.    Currently in Pain? No/denies                [facility] OT Assessment - 06/22/21 1311       Assessment   Medical Diagnosis Chronic left shoulder pain      Precautions   Precautions Fall      Prior Function   Level of Independence Independent with household mobility with device;Independent with transfers      Observation/Other Assessments   Focus on Therapeutic Outcomes (FOTO)  59/100      ROM / Strength   AROM / PROM / Strength AROM;Strength      AROM   Overall AROM Comments Assessed standing, er/IR adducted    AROM Assessment Site Shoulder    Right/Left Shoulder Left    Left Shoulder Flexion 112 Degrees   previous: 85   Left Shoulder ABduction 110 Degrees   previous: 110   Left Shoulder Internal Rotation 90 Degrees   previous: same   Left Shoulder External Rotation 24 Degrees   previous: 14      PROM   Overall PROM Comments Patient presents with full/functional passive ROM in the LUE. Assessed supine. IR/er adducted      Strength   Overall Strength Comments Assessed seated, er/IR adducted    Strength Assessment Site Shoulder    Right/Left Shoulder Left    Left Shoulder Flexion 5/5   previous: 3-/5   Left Shoulder ABduction 4+/5   previous: 3-/5   Left Shoulder Internal Rotation 5/5   previous: 3/5   Left Shoulder External Rotation 3+/5   previous: 2+/5  OT Treatments/Exercises (OP) - 06/22/21 1336       Exercises   Exercises Shoulder      Shoulder Exercises: Stretch   Wall Stretch - Flexion 2 reps;10 seconds    Other Shoulder Stretches Doorway stretch; 2x10"      Functional Reaching Activities   High Level High level reaching task completed standing while placing Squigz on door.PLaced 3 total.                    OT Education - 06/22/21 1334     Education Details reviewed therapy goals. Added doorway stretch and shoulder flexion stretch to HEP.    Person(s) Educated Patient    Methods Explanation;Demonstration;Handout    Comprehension Verbalized understanding;Returned demonstration              OT Short Term Goals - 06/22/21 1322       OT SHORT TERM GOAL #1   Title Pt will be provided with and educated on HEP to improve mobility of LUE required during ADL completion.    Time 4    Period Weeks    Status Achieved    Target Date 06/26/21      OT SHORT TERM GOAL #2   Title Pt will decrease pain in LUE to 3/10 or less to improve ability to sleep for 3+ hours without waking due to pain.    Baseline 10/3: Pt reports that his is able to sleep 3-4 hours before the pain will wake him up if he sleeps on his left arm.    Time 4    Period Weeks    Status Achieved      OT SHORT TERM GOAL #3   Title Pt will decrease LUE fascial restrictions to min amounts or less to improve ability to complete functional reaching  tasks.    Baseline 9/9: Pt reports continued deficits in functional reaching.    Time 4    Period Weeks    Status Achieved      OT SHORT TERM GOAL #4   Title Pt will increase LUE A/ROM to [facility] to improve ability to reach for clothes or items in closet.    Time 4    Period Weeks    Status On-going      OT SHORT TERM GOAL #5   Title Pt will increase LUE strength to 4/5 or greater to improve ability to lift cast iron skillet during meal prep tasks.    Baseline 10/3: Patient demonstrates a 3+/5 for external rotation and 5/5 for remainder of shoulder ranges.    Time 4    Period Weeks    Status Partially Met                      Plan - 06/23/21 9357     Clinical Impression Statement A: Reassessment completed this date. patient has made progress overall since start of OT for his left shoulder pain. ROM has increased in all areas except abduction which has remained the same since evaluation. Pt demonstrates greater ROM when standing versus seated due to better postural alignment of the shoulder. Strength has increased and is functional. No fascial restrictions are noted with no reports of pain during session. Pt has met 3/5 thrapy goals with 1 additional goal partially met. He reports that he continues to have difficulty reaching the back of his head while using clippers with his left hand. His right UE must help push his hand to the correct  position. His reaching overall has improved although he continues to be limited. He wishes to continue working on reaching in flexion.    Body Structure / Function / Physical Skills ADL;Endurance;UE functional use;Fascial restriction;Pain;ROM;IADL;Strength    OT Frequency 2x / week    OT Duration Other (comment)   3 weeks   Plan P: Continue skilled OT services 2x a week for 3 more weeks focusing on mentioned deficits above.    OT Home Exercise Plan 8/16: A/ROM 10/3: Shoulder stretches: doorway and flexion    Consulted and Agree with Plan of Care  Patient             Patient will benefit from skilled therapeutic intervention in order to improve the following deficits and impairments:   Body Structure / Function / Physical Skills: ADL, Endurance, UE functional use, Fascial restriction, Pain, ROM, IADL, Strength       Visit Diagnosis: Chronic left shoulder pain - Plan: Ot plan of care cert/re-cert  Other symptoms and signs involving the musculoskeletal system - Plan: Ot plan of care cert/re-cert  Stiffness of left shoulder, not elsewhere classified - Plan: Ot plan of care cert/re-cert    Problem List Patient Active Problem List   Diagnosis Date Noted   Hypokalemia 05/18/2021   Muscular deconditioning 05/12/2021   Frequent falls 05/12/2021   Fatigue 04/29/2021   Recent unexplained weight loss 04/23/2021   Left shoulder pain 04/23/2021   Chest tightness 04/23/2021   Deep tissue injury 04/23/2021   Myoclonus 04/15/2021   Pneumonia 04/03/2021   PNA (pneumonia) 04/02/2021   Abnormal finding on urinalysis 04/01/2021   Syncope 02/16/2021   Minor head injury    Right hip pain 02/11/2021   Erectile dysfunction 01/22/2021   Gait abnormality 09/30/2020   Mild non proliferative diabetic retinopathy ([place]) 07/16/2020   Need for immunization against influenza 06/03/2020   S/P left knee arthroscopy 05/06/20 05/13/2020   Abnormal MRI 03/26/2020   Joint disorder of knee 03/26/2020   Fall at home, initial encounter 03/13/2020   Anemia 09/03/2019   Reflux esophagitis 09/03/2019   Esophageal dysphagia 05/30/2019   Constipation 05/30/2019   Positive colorectal cancer screening using Cologuard test 05/30/2019   Diabetic foot ulcer ([place]) 09/06/2018   Orthostasis 08/25/2016   Congestive heart failure ([place]) 05/17/2016   Bilateral carotid artery disease ([place]) 07/08/2014   Obstructive sleep apnea 02/12/2014   Restless legs 10/23/2013   Overweight with body mass index (BMI) of 29 to 29.9 in adult 10/23/2013   Hyperlipidemia  07/16/2013   Peripheral arterial disease ([place]) 07/16/2013   Hypertension 03/14/2012   Type 2 diabetes mellitus with circulatory disorder ([place]) 03/14/2012   Peripheral neuropathy 03/14/2012   Cardiomyopathy- EF NL 01/2013, now 30-35% echo 03/14/2012    Ailene Ravel, OTR/L,CBIS  [phone]  06/23/2021, 10:20 AM  [facility] [address] [place], [place], [address] Phone: [phone]   Fax:  [phone]  Name: [name] MRN: [id number] Date of Birth: 1946-04-23

## 2021-06-24 ENCOUNTER — Encounter (HOSPITAL_COMMUNITY): Payer: HMO | Admitting: Physical Therapy

## 2021-06-24 ENCOUNTER — Ambulatory Visit (HOSPITAL_COMMUNITY): Payer: HMO | Admitting: Physical Therapy

## 2021-06-26 ENCOUNTER — Ambulatory Visit (HOSPITAL_COMMUNITY): Payer: HMO | Admitting: Physical Therapy

## 2021-06-26 ENCOUNTER — Encounter (HOSPITAL_COMMUNITY): Payer: Self-pay | Admitting: Physical Therapy

## 2021-06-26 ENCOUNTER — Ambulatory Visit (HOSPITAL_COMMUNITY): Payer: HMO

## 2021-06-26 ENCOUNTER — Encounter (HOSPITAL_COMMUNITY): Payer: Self-pay

## 2021-06-26 ENCOUNTER — Other Ambulatory Visit: Payer: Self-pay

## 2021-06-26 DIAGNOSIS — G8929 Other chronic pain: Secondary | ICD-10-CM

## 2021-06-26 DIAGNOSIS — R29898 Other symptoms and signs involving the musculoskeletal system: Secondary | ICD-10-CM

## 2021-06-26 DIAGNOSIS — R2681 Unsteadiness on feet: Secondary | ICD-10-CM

## 2021-06-26 DIAGNOSIS — R262 Difficulty in walking, not elsewhere classified: Secondary | ICD-10-CM

## 2021-06-26 DIAGNOSIS — M25612 Stiffness of left shoulder, not elsewhere classified: Secondary | ICD-10-CM

## 2021-06-26 DIAGNOSIS — M6281 Muscle weakness (generalized): Secondary | ICD-10-CM | POA: Diagnosis not present

## 2021-06-26 NOTE — Therapy (Signed)
[facility] [address] [place], [place], [address] Phone: [phone]   Fax:  [phone]  Occupational Therapy Treatment  Patient Details  Name: [name] MRN: [id number] Date of Birth: 01/17/1946 Referring Provider (OT): Demetrius Revel, NP   Encounter Date: 06/26/2021   OT End of Session - 06/26/21 1019     Visit Number 11    Number of Visits 16    Date for OT Re-Evaluation 07/13/21    Authorization Type [employer]; $30 copay    Authorization Time Period no visit limit    Progress Note Due on Visit 50    OT Start Time [phone]   Session started late due to patient using bathroom prior to session   OT Stop Time 1025    OT Time Calculation (min) 32 min    Activity Tolerance Patient tolerated treatment well    Behavior During Therapy [facility] for tasks assessed/performed             Past Medical History:  Diagnosis Date   Aftercare following surgery of the circulatory system, NEC 12/04/2013   Angina decubitus ([place]) 05/19/2016   Arrhythmia 05/20/2016   Arthritis    ARTHRITIS, RIGHT FOOT 06/26/2008   Qualifier: Diagnosis of  By: Aline Brochure MD, [name]     Cardiomyopathy- EF NL 01/2013, now 30-35% echo 03/14/2012   Carotid artery occlusion    left s/p CEA   Cellulitis of left foot 05/07/2018   CHF (congestive heart failure) ([place])    Critical lower limb ischemia ([place]) 02/28/2014   Critical limb ischemia    Diabetes mellitus    Elevated troponin 04/11/2016   GERD (gastroesophageal reflux disease)    History of stroke June 2013 03/14/2012   HOH (hard of hearing)    Hypertension    Hypothyroidism    Nonhealing skin ulcer ([place]) 10/28/2014   Obstructive sleep apnea    Orthostatic hypotension    Osteomyelitis ([place])    left great toe   Pain in the chest 05/17/2016   Peripheral arterial disease ([place]), s/p PTA x 2 RLE    nonhealing ulcers bilaterally on each great toe   Pneumonia    PONV (postoperative nausea and vomiting)     Pulmonary nodule 05/17/2016   Restless leg syndrome    Shortness of breath    Stroke [facility]) March 08, 2012   Toe osteomyelitis, left [facility])    Ulcer of great toe, left, with necrosis of bone ([place])    Wears dentures     Past Surgical History:  Procedure Laterality Date   AMPUTATION TOE Left 07/25/2018   Procedure: AMPUTATION TOE INTERPHALANGEAL HALLUX LEFT;  Surgeon: Evelina Bucy, DPM;  Location: [facility];  Service: Podiatry;  Laterality: Left;   ANGIOPLASTY  02/28/14   diamond back orbital rotational atherectomy of Rt. tibial   BACK SURGERY     BONE BIOPSY Left 07/25/2018   Procedure: SUPERFICIAL BONE BIOPSY;  Surgeon: Evelina Bucy, DPM;  Location: [facility];  Service: Podiatry;  Laterality: Left;   CARDIAC CATHETERIZATION N/A 05/19/2016   Procedure: Right/Left Heart Cath and Coronary Angiography;  Surgeon: Belva Crome, MD;  Location: [place] CV LAB;  Service: Cardiovascular;  Laterality: N/A;   CERVICAL FUSION     ENDARTERECTOMY Left 11/29/2013   Procedure: ENDARTERECTOMY CAROTID;  Surgeon: Serafina Mitchell, MD;  Location: [place];  Service: Vascular;  Laterality: Left;   ESOPHAGOGASTRODUODENOSCOPY  02/2010   Dr. Gala Romney: patient presented with food impaction, schatzki  ring with superimposed component of stricture with erosive reflux esophagitis, s/p disimpaction but dilation planned at later date    ESOPHAGOGASTRODUODENOSCOPY (EGD) WITH PROPOFOL N/A 08/13/2019   Dr. Gala Romney: Erosive reflux esophagitis with mild stricture and incidental Mallory-Weiss tear which precluded esophageal dilation.  Medium sized hiatal hernia.   ESOPHAGOGASTRODUODENOSCOPY (EGD) WITH PROPOFOL N/A 10/11/2019   Dr. Gala Romney: Esophageal stenosis status post dilation, moderate hiatal hernia   EYE SURGERY     FLEXIBLE SIGMOIDOSCOPY N/A 08/13/2019   Procedure: FLEXIBLE SIGMOIDOSCOPY;  Surgeon: [name], MD;  [facility];  Service: Endoscopy;  Laterality: N/A;  colonoscopy aboerted due to formed stool and poor  prep   FOOT SURGERY     KNEE ARTHROSCOPY WITH MEDIAL MENISECTOMY Left 05/06/2020   Procedure: KNEE ARTHROSCOPY WITH MEDIAL MENISCECTOMY AND LATERAL MENISCECTOMY;  Surgeon: [name], MD;  [facility]: [facility];  Service: Orthopedics;  Laterality: Left;   Lower ext duplex doppler  03/14/14   Rt ABI 1.2   LOWER EXTREMITY ANGIOGRAM Bilateral 02/18/2014   Procedure: LOWER EXTREMITY ANGIOGRAM;  Surgeon: [name], MD;  Location: [facility] CATH LAB;  Service: Cardiovascular;  Laterality: Bilateral;   LOWER EXTREMITY ANGIOGRAM N/A 10/31/2014   Procedure: LOWER EXTREMITY ANGIOGRAM;  Surgeon: [name], MD;  Location: [facility] CATH LAB;  Service: Cardiovascular;  Laterality: N/A;   MALONEY DILATION N/A 10/11/2019   Procedure: Venia Minks DILATION;  Surgeon: [name], MD;  [facility];  Service: Endoscopy;  Laterality: N/A;   MULTIPLE TOOTH EXTRACTIONS     PV angiogram  02/18/2014   tibial vessel diseas bil.   SPINE SURGERY     tendon achillies lengthing and sesamoid      There were no vitals filed for this visit.   Subjective Assessment - 06/26/21 0955     Subjective  S: No pain today.    Currently in Pain? No/denies                [facility] OT Assessment - 06/26/21 0955       Assessment   Medical Diagnosis Chronic left shoulder pain      Precautions   Precautions Fall                      OT Treatments/Exercises (OP) - 06/26/21 0955       Exercises   Exercises Shoulder      Shoulder Exercises: ROM/Strengthening   UBE (Upper Arm Bike) Level 5 3' forward 2' reverse pace: 12.5      Shoulder Exercises: Stretch   Wall Stretch - Flexion 2 reps;10 seconds    Other Shoulder Stretches Doorway stretch; 2x10"      Functional Reaching Activities   High Level High level reaching using Squigz while standing. Placed and removed standing with seated rest breaks taken during task due to dizziness.                      OT Short Term Goals -  06/26/21 1022       OT SHORT TERM GOAL #1   Title Pt will be provided with and educated on HEP to improve mobility of LUE required during ADL completion.    Time 4    Period Weeks    Target Date 06/26/21      OT SHORT TERM GOAL #2   Title Pt will decrease pain in LUE to 3/10 or less to improve ability to sleep for 3+ hours  without waking due to pain.    Baseline 10/3: Pt reports that his is able to sleep 3-4 hours before the pain will wake him up if he sleeps on his left arm.    Time 4    Period Weeks      OT SHORT TERM GOAL #3   Title Pt will decrease LUE fascial restrictions to min amounts or less to improve ability to complete functional reaching tasks.    Baseline 9/9: Pt reports continued deficits in functional reaching.    Time 4    Period Weeks      OT SHORT TERM GOAL #4   Title Pt will increase LUE A/ROM to [facility] to improve ability to reach for clothes or items in closet.    Time 4    Period Weeks    Status On-going      OT SHORT TERM GOAL #5   Title Pt will increase LUE strength to 4/5 or greater to improve ability to lift cast iron skillet during meal prep tasks.    Baseline 10/3: Patient demonstrates a 3+/5 for external rotation and 5/5 for remainder of shoulder ranges.    Time 4    Period Weeks    Status Partially Met                      Plan - 06/26/21 1209     Clinical Impression Statement A: Pt was able to complete UBE bike with an added minute for forward and reverse with no difficulty. Patient completed functional reaching tasks with moderate difficulty. Reports of dizziness this session which required frequent seated rest breaks. VC for form and technique were provided.    Body Structure / Function / Physical Skills ADL;Endurance;UE functional use;Fascial restriction;Pain;ROM;IADL;Strength    Plan P: Continue with working on increasing functional reaching. Work on Lobbyist.    Consulted and Agree with Plan of Care Patient              Patient will benefit from skilled therapeutic intervention in order to improve the following deficits and impairments:   Body Structure / Function / Physical Skills: ADL, Endurance, UE functional use, Fascial restriction, Pain, ROM, IADL, Strength       Visit Diagnosis: Other symptoms and signs involving the musculoskeletal system  Chronic left shoulder pain  Stiffness of left shoulder, not elsewhere classified    Problem List Patient Active Problem List   Diagnosis Date Noted   Hypokalemia 05/18/2021   Muscular deconditioning 05/12/2021   Frequent falls 05/12/2021   Fatigue 04/29/2021   Recent unexplained weight loss 04/23/2021   Left shoulder pain 04/23/2021   Chest tightness 04/23/2021   Deep tissue injury 04/23/2021   Myoclonus 04/15/2021   Pneumonia 04/03/2021   PNA (pneumonia) 04/02/2021   Abnormal finding on urinalysis 04/01/2021   Syncope 02/16/2021   Minor head injury    Right hip pain 02/11/2021   Erectile dysfunction 01/22/2021   Gait abnormality 09/30/2020   Mild non proliferative diabetic retinopathy ([place]) 07/16/2020   Need for immunization against influenza 06/03/2020   S/P left knee arthroscopy 05/06/20 05/13/2020   Abnormal MRI 03/26/2020   Joint disorder of knee 03/26/2020   Fall at home, initial encounter 03/13/2020   Anemia 09/03/2019   Reflux esophagitis 09/03/2019   Esophageal dysphagia 05/30/2019   Constipation 05/30/2019   Positive colorectal cancer screening using Cologuard test 05/30/2019   Diabetic foot ulcer ([place]) 09/06/2018   Orthostasis 08/25/2016   Congestive heart  failure ([place]) 05/17/2016   Bilateral carotid artery disease ([place]) 07/08/2014   Obstructive sleep apnea 02/12/2014   Restless legs 10/23/2013   Overweight with body mass index (BMI) of 29 to 29.9 in adult 10/23/2013   Hyperlipidemia 07/16/2013   Peripheral arterial disease ([place]) 07/16/2013   Hypertension 03/14/2012   Type 2 diabetes mellitus with circulatory  disorder ([place]) 03/14/2012   Peripheral neuropathy 03/14/2012   Cardiomyopathy- EF NL 01/2013, now 30-35% echo 03/14/2012    Ailene Ravel, OTR/L,CBIS  [phone]  06/26/2021, 12:14 PM  [facility] [address] [place], [place], [address] Phone: [phone]   Fax:  [phone]  Name: [name] MRN: [id number] Date of Birth: 1945-12-04

## 2021-06-26 NOTE — Therapy (Signed)
[facility] [address] [place], [place], [address] Phone: [phone]   Fax:  [phone]  Physical Therapy Treatment  Patient Details  Name: [name] MRN: [id number] Date of Birth: August 05, 1946 Referring Provider (PT): Noreene Larsson, NP   Encounter Date: 06/26/2021   PT End of Session - 06/26/21 1058     Visit Number 17    Number of Visits 26    Date for PT Re-Evaluation 07/02/21    Authorization Type [employer], no VL, no auth ,    Progress Note Due on Visit 20    PT Start Time 1048    PT Stop Time 6811    PT Time Calculation (min) 38 min    Equipment Utilized During Treatment Gait belt    Activity Tolerance Patient tolerated treatment well    Behavior During Therapy WFL for tasks assessed/performed             Past Medical History:  Diagnosis Date   Aftercare following surgery of the circulatory system, NEC 12/04/2013   Angina decubitus ([place]) 05/19/2016   Arrhythmia 05/20/2016   Arthritis    ARTHRITIS, RIGHT FOOT 06/26/2008   Qualifier: Diagnosis of  By: Aline Brochure MD, [name]     Cardiomyopathy- EF NL 01/2013, now 30-35% echo 03/14/2012   Carotid artery occlusion    left s/p CEA   Cellulitis of left foot 05/07/2018   CHF (congestive heart failure) ([place])    Critical lower limb ischemia ([place]) 02/28/2014   Critical limb ischemia    Diabetes mellitus    Elevated troponin 04/11/2016   GERD (gastroesophageal reflux disease)    History of stroke June 2013 03/14/2012   HOH (hard of hearing)    Hypertension    Hypothyroidism    Nonhealing skin ulcer ([place]) 10/28/2014   Obstructive sleep apnea    Orthostatic hypotension    Osteomyelitis ([place])    left great toe   Pain in the chest 05/17/2016   Peripheral arterial disease ([place]), s/p PTA x 2 RLE    nonhealing ulcers bilaterally on each great toe   Pneumonia    PONV (postoperative nausea and vomiting)    Pulmonary nodule 05/17/2016   Restless leg syndrome     Shortness of breath    Stroke [facility]) March 08, 2012   Toe osteomyelitis, left [facility])    Ulcer of great toe, left, with necrosis of bone ([place])    Wears dentures     Past Surgical History:  Procedure Laterality Date   AMPUTATION TOE Left 07/25/2018   Procedure: AMPUTATION TOE INTERPHALANGEAL HALLUX LEFT;  Surgeon: Evelina Bucy, DPM;  Location: [facility];  Service: Podiatry;  Laterality: Left;   ANGIOPLASTY  02/28/14   diamond back orbital rotational atherectomy of Rt. tibial   BACK SURGERY     BONE BIOPSY Left 07/25/2018   Procedure: SUPERFICIAL BONE BIOPSY;  Surgeon: Evelina Bucy, DPM;  Location: [place];  Service: Podiatry;  Laterality: Left;   CARDIAC CATHETERIZATION N/A 05/19/2016   Procedure: Right/Left Heart Cath and Coronary Angiography;  Surgeon: Belva Crome, MD;  Location: [place] CV LAB;  Service: Cardiovascular;  Laterality: N/A;   CERVICAL FUSION     ENDARTERECTOMY Left 11/29/2013   Procedure: ENDARTERECTOMY CAROTID;  Surgeon: Serafina Mitchell, MD;  Location: [place];  Service: Vascular;  Laterality: Left;   ESOPHAGOGASTRODUODENOSCOPY  02/2010   Dr. Gala Romney: patient presented with food impaction, schatzki ring with superimposed component of stricture with erosive  reflux esophagitis, s/p disimpaction but dilation planned at later date    ESOPHAGOGASTRODUODENOSCOPY (EGD) WITH PROPOFOL N/A 08/13/2019   Dr. Gala Romney: Erosive reflux esophagitis with mild stricture and incidental Mallory-Weiss tear which precluded esophageal dilation.  Medium sized hiatal hernia.   ESOPHAGOGASTRODUODENOSCOPY (EGD) WITH PROPOFOL N/A 10/11/2019   Dr. Gala Romney: Esophageal stenosis status post dilation, moderate hiatal hernia   EYE SURGERY     FLEXIBLE SIGMOIDOSCOPY N/A 08/13/2019   Procedure: FLEXIBLE SIGMOIDOSCOPY;  Surgeon: [name], MD;  [facility];  Service: Endoscopy;  Laterality: N/A;  colonoscopy aboerted due to formed stool and poor prep   FOOT SURGERY     KNEE ARTHROSCOPY WITH MEDIAL  MENISECTOMY Left 05/06/2020   Procedure: KNEE ARTHROSCOPY WITH MEDIAL MENISCECTOMY AND LATERAL MENISCECTOMY;  Surgeon: [name], MD;  Location: [facility];  Service: Orthopedics;  Laterality: Left;   Lower ext duplex doppler  03/14/14   Rt ABI 1.2   LOWER EXTREMITY ANGIOGRAM Bilateral 02/18/2014   Procedure: LOWER EXTREMITY ANGIOGRAM;  Surgeon: [name], MD;  Location: [facility] CATH LAB;  Service: Cardiovascular;  Laterality: Bilateral;   LOWER EXTREMITY ANGIOGRAM N/A 10/31/2014   Procedure: LOWER EXTREMITY ANGIOGRAM;  Surgeon: [name], MD;  Location: [facility] CATH LAB;  Service: Cardiovascular;  Laterality: N/A;   MALONEY DILATION N/A 10/11/2019   Procedure: Venia Minks DILATION;  Surgeon: [name], MD;  [facility];  Service: Endoscopy;  Laterality: N/A;   MULTIPLE TOOTH EXTRACTIONS     PV angiogram  02/18/2014   tibial vessel diseas bil.   SPINE SURGERY     tendon achillies lengthing and sesamoid      There were no vitals filed for this visit.   Subjective Assessment - 06/26/21 1057     Subjective No pain, reports slight difficulty breathing with this current mask on    Pertinent History CHF , R foot wound, left knee scope, DB, Restless leg syndrome, hx of stroke.    Limitations House hold activities;Standing;Lifting    How long can you walk comfortably? 10    Patient Stated Goals be able to walk normally and function correctly.    Currently in Pain? No/denies                [facility] PT Assessment - 06/26/21 1059       Assessment   Medical Diagnosis gait training    Referring Provider (PT) Noreene Larsson, NP                           [facility] Adult PT Treatment/Exercise - 06/26/21 1100       Knee/Hip Exercises: Aerobic   Nustep level 6 x 5 min for dynamic warm-up      Knee/Hip Exercises: Standing   Other Standing Knee Exercises foot taps 5# ankle weights light UE support on 4" step 2x2 minutes; lateral taps B - same set up x2 1 minute  each side    Other Standing Knee Exercises side stepping x 5RT with 1 UE only in // bars - cues to stand tall and straighten knees      Knee/Hip Exercises: Seated   Sit to Sand 3 sets;5 reps;without UE support   cues for hands reaching forward                    PT Education - 06/26/21 1122     Education Details on hydration, on drinking prior to  next session.    Person(s) Educated Patient    Methods Explanation    Comprehension Verbalized understanding              PT Short Term Goals - 05/21/21 1501       PT SHORT TERM GOAL #1   Title Patient will be independent in self management strategies to improve quality of life and functional outcomes.    Baseline Reports variable compliance    Time 3    Period Weeks    Status Partially Met    Target Date 04/27/21      PT SHORT TERM GOAL #2   Title Patient will be able to report at least 25% improvement in overall mobility.    Baseline Reports 50% improved    Time 3    Period Weeks    Status Achieved    Target Date 04/27/21      PT SHORT TERM GOAL #3   Title Demo improved balance and BLE strength as evidenced by time of 18 sec 5xSTS test    Baseline 13 seconds with use of UES, but uses heavy momentum use of UEs and does not fully extend knees    Time 3    Period Weeks    Status Partially Met    Target Date 04/27/21               PT Long Term Goals - 06/04/21 1202       PT LONG TERM GOAL #1   Title Patient will improve FOTO score by at least 10 points in order to indicate improved tolerance to activity.    Baseline 24% decrease    Time 6    Period Weeks    Status On-going      PT LONG TERM GOAL #2   Title Demo improved gait velocity and safety as evidenced by distance of 150 ft during    Baseline 60 ft with rollator    Time 6    Period Weeks    Status On-going      PT LONG TERM GOAL #3   Title Patient will not have foot wound present to reduce risk of infection.    Time 4    Period  Weeks    Target Date 07/02/21      PT LONG TERM GOAL #4   Title Patient will verbalize proper foot care techniques.    Time 4    Period Weeks    Status New    Target Date 07/02/21                   Plan - 06/26/21 1118     Clinical Impression Statement Continued with lower extremity strengthening and endurance training today. Cues to stand up tall throughout session. Overall patient with slight increase in fatigue noted today but no difficulties with exercises. Will continue with current POC as tolerated.    Personal Factors and Comorbidities Comorbidity 1;Comorbidity 3+;Comorbidity 2    Comorbidities CHF, left knee surgery, right chronic foot wound, DB, restless leg syndromes    Examination-Activity Limitations Bathing;Lift;Locomotion Level;Transfers;Stand;Stairs;Squat;Reach Overhead;Carry    Examination-Participation Restrictions Community Activity;Meal Prep    Stability/Clinical Decision Making Evolving/Moderate complexity    Rehab Potential Fair    PT Frequency 2x / week    PT Duration 4 weeks    PT Treatment/Interventions ADLs/Self Care Home Management;Other (comment);Moist Heat;Balance training;Therapeutic exercise;Therapeutic activities;Functional mobility training;Stair training;Gait training;DME Instruction;Ultrasound;Neuromuscular re-education;Patient/family education;Orthotic Fit/Training;Manual techniques;Taping;Passive range of motion;Electrical Stimulation  PT Next Visit Plan Continue LE strength, gait and balance as tolerated.  Rt hip much weaker than Lt;    PT Home Exercise Plan hip abd/add isometric, QS, hamstring stretch;  04/15/21: Isometric abd, abd supine, SAQ, bridge 9/1 clamshell, sideyling hip abduciton 9/21 standing hip abduction/ extenison, knee flexion, heel raise/ toe raise    Consulted and Agree with Plan of Care Patient             Patient will benefit from skilled therapeutic intervention in order to improve the following deficits and  impairments:  Abnormal gait, Decreased endurance, Decreased skin integrity, Increased edema, Decreased activity tolerance, Decreased balance, Decreased mobility, Decreased knowledge of use of DME, Decreased strength, Difficulty walking, Pain, Decreased range of motion, Impaired flexibility, Postural dysfunction, Improper body mechanics  Visit Diagnosis: Difficulty in walking, not elsewhere classified  Muscle weakness (generalized)  Unsteadiness on feet     Problem List Patient Active Problem List   Diagnosis Date Noted   Hypokalemia 05/18/2021   Muscular deconditioning 05/12/2021   Frequent falls 05/12/2021   Fatigue 04/29/2021   Recent unexplained weight loss 04/23/2021   Left shoulder pain 04/23/2021   Chest tightness 04/23/2021   Deep tissue injury 04/23/2021   Myoclonus 04/15/2021   Pneumonia 04/03/2021   PNA (pneumonia) 04/02/2021   Abnormal finding on urinalysis 04/01/2021   Syncope 02/16/2021   Minor head injury    Right hip pain 02/11/2021   Erectile dysfunction 01/22/2021   Gait abnormality 09/30/2020   Mild non proliferative diabetic retinopathy ([place]) 07/16/2020   Need for immunization against influenza 06/03/2020   S/P left knee arthroscopy 05/06/20 05/13/2020   Abnormal MRI 03/26/2020   Joint disorder of knee 03/26/2020   Fall at home, initial encounter 03/13/2020   Anemia 09/03/2019   Reflux esophagitis 09/03/2019   Esophageal dysphagia 05/30/2019   Constipation 05/30/2019   Positive colorectal cancer screening using Cologuard test 05/30/2019   Diabetic foot ulcer ([place]) 09/06/2018   Orthostasis 08/25/2016   Congestive heart failure ([place]) 05/17/2016   Bilateral carotid artery disease ([place]) 07/08/2014   Obstructive sleep apnea 02/12/2014   Restless legs 10/23/2013   Overweight with body mass index (BMI) of 29 to 29.9 in adult 10/23/2013   Hyperlipidemia 07/16/2013   Peripheral arterial disease ([place]) 07/16/2013   Hypertension 03/14/2012   Type 2 diabetes  mellitus with circulatory disorder ([place]) 03/14/2012   Peripheral neuropathy 03/14/2012   Cardiomyopathy- EF NL 01/2013, now 30-35% echo 03/14/2012   11:28 AM, 06/26/21 [name], DPT Physical Therapy with [facility]  [phone] office   [facility] [address] [place], [place], [address] Phone: [phone]   Fax:  [phone]  Name: [name] MRN: [id number] Date of Birth: 03/05/1946

## 2021-06-29 ENCOUNTER — Other Ambulatory Visit: Payer: Self-pay

## 2021-06-29 ENCOUNTER — Ambulatory Visit (HOSPITAL_COMMUNITY): Payer: HMO | Admitting: Physical Therapy

## 2021-06-29 ENCOUNTER — Encounter (HOSPITAL_COMMUNITY): Payer: Self-pay | Admitting: Physical Therapy

## 2021-06-29 DIAGNOSIS — R262 Difficulty in walking, not elsewhere classified: Secondary | ICD-10-CM

## 2021-06-29 DIAGNOSIS — M6281 Muscle weakness (generalized): Secondary | ICD-10-CM

## 2021-06-29 DIAGNOSIS — R2681 Unsteadiness on feet: Secondary | ICD-10-CM

## 2021-06-29 NOTE — Therapy (Signed)
[facility] [address] [place], [place], [address] Phone: [phone]   Fax:  [phone]  Physical Therapy Treatment  Patient Details  Name: [name] MRN: [id number] Date of Birth: December 08, 1945 Referring Provider (PT): Noreene Larsson, NP   Encounter Date: 06/29/2021   PT End of Session - 06/29/21 1036     Visit Number 18    Number of Visits 26    Date for PT Re-Evaluation 07/02/21    Authorization Type [employer], no VL, no auth ,    Progress Note Due on Visit 20    PT Start Time 1003    PT Stop Time 1046    PT Time Calculation (min) 43 min    Equipment Utilized During Treatment Gait belt    Activity Tolerance Patient tolerated treatment well    Behavior During Therapy WFL for tasks assessed/performed             Past Medical History:  Diagnosis Date   Aftercare following surgery of the circulatory system, NEC 12/04/2013   Angina decubitus ([facility]) 05/19/2016   Arrhythmia 05/20/2016   Arthritis    ARTHRITIS, RIGHT FOOT 06/26/2008   Qualifier: Diagnosis of  By: Aline Brochure MD, [name]     Cardiomyopathy- EF NL 01/2013, now 30-35% echo 03/14/2012   Carotid artery occlusion    left s/p CEA   Cellulitis of left foot 05/07/2018   CHF (congestive heart failure) ([place])    Critical lower limb ischemia ([place]) 02/28/2014   Critical limb ischemia    Diabetes mellitus    Elevated troponin 04/11/2016   GERD (gastroesophageal reflux disease)    History of stroke June 2013 03/14/2012   HOH (hard of hearing)    Hypertension    Hypothyroidism    Nonhealing skin ulcer ([place]) 10/28/2014   Obstructive sleep apnea    Orthostatic hypotension    Osteomyelitis ([place])    left great toe   Pain in the chest 05/17/2016   Peripheral arterial disease ([place]), s/p PTA x 2 RLE    nonhealing ulcers bilaterally on each great toe   Pneumonia    PONV (postoperative nausea and vomiting)    Pulmonary nodule 05/17/2016   Restless leg syndrome     Shortness of breath    Stroke [facility]) March 08, 2012   Toe osteomyelitis, left [facility])    Ulcer of great toe, left, with necrosis of bone ([place])    Wears dentures     Past Surgical History:  Procedure Laterality Date   AMPUTATION TOE Left 07/25/2018   Procedure: AMPUTATION TOE INTERPHALANGEAL HALLUX LEFT;  Surgeon: Evelina Bucy, DPM;  Location: [facility];  Service: Podiatry;  Laterality: Left;   ANGIOPLASTY  02/28/14   diamond back orbital rotational atherectomy of Rt. tibial   BACK SURGERY     BONE BIOPSY Left 07/25/2018   Procedure: SUPERFICIAL BONE BIOPSY;  Surgeon: Evelina Bucy, DPM;  Location: [facility];  Service: Podiatry;  Laterality: Left;   CARDIAC CATHETERIZATION N/A 05/19/2016   Procedure: Right/Left Heart Cath and Coronary Angiography;  Surgeon: Belva Crome, MD;  Location: [facility] CV LAB;  Service: Cardiovascular;  Laterality: N/A;   CERVICAL FUSION     ENDARTERECTOMY Left 11/29/2013   Procedure: ENDARTERECTOMY CAROTID;  Surgeon: Serafina Mitchell, MD;  Location: [facility];  Service: Vascular;  Laterality: Left;   ESOPHAGOGASTRODUODENOSCOPY  02/2010   Dr. Gala Romney: patient presented with food impaction, schatzki ring with superimposed component of stricture with erosive  reflux esophagitis, s/p disimpaction but dilation planned at later date    ESOPHAGOGASTRODUODENOSCOPY (EGD) WITH PROPOFOL N/A 08/13/2019   Dr. Gala Romney: Erosive reflux esophagitis with mild stricture and incidental Mallory-Weiss tear which precluded esophageal dilation.  Medium sized hiatal hernia.   ESOPHAGOGASTRODUODENOSCOPY (EGD) WITH PROPOFOL N/A 10/11/2019   Dr. Gala Romney: Esophageal stenosis status post dilation, moderate hiatal hernia   EYE SURGERY     FLEXIBLE SIGMOIDOSCOPY N/A 08/13/2019   Procedure: FLEXIBLE SIGMOIDOSCOPY;  Surgeon: [name], MD;  [facility];  Service: Endoscopy;  Laterality: N/A;  colonoscopy aboerted due to formed stool and poor prep   FOOT SURGERY     KNEE ARTHROSCOPY WITH MEDIAL  MENISECTOMY Left 05/06/2020   Procedure: KNEE ARTHROSCOPY WITH MEDIAL MENISCECTOMY AND LATERAL MENISCECTOMY;  Surgeon: [name], MD;  [facility];  Service: Orthopedics;  Laterality: Left;   Lower ext duplex doppler  03/14/14   Rt ABI 1.2   LOWER EXTREMITY ANGIOGRAM Bilateral 02/18/2014   Procedure: LOWER EXTREMITY ANGIOGRAM;  Surgeon: [name], MD;  Location: [facility] CATH LAB;  Service: Cardiovascular;  Laterality: Bilateral;   LOWER EXTREMITY ANGIOGRAM N/A 10/31/2014   Procedure: LOWER EXTREMITY ANGIOGRAM;  Surgeon: [name], MD;  Location: [facility] CATH LAB;  Service: Cardiovascular;  Laterality: N/A;   MALONEY DILATION N/A 10/11/2019   Procedure: Venia Minks DILATION;  Surgeon: [name], MD;  [facility];  Service: Endoscopy;  Laterality: N/A;   MULTIPLE TOOTH EXTRACTIONS     PV angiogram  02/18/2014   tibial vessel diseas bil.   SPINE SURGERY     tendon achillies lengthing and sesamoid      There were no vitals filed for this visit.   Subjective Assessment - 06/29/21 1010     Subjective Reports he is doing his exercises. States that the side walking is still challenging. States he has one goal and that is to get rid of his assistive device. Reports overall he feels 50-75% better. States he is up and moving around and doing things more. States he is slow but he is up. States that he is in the process of joining the [employer].    Pertinent History CHF , R foot wound, left knee scope, DB, Restless leg syndrome, hx of stroke.    Limitations House hold activities;Standing;Lifting    How long can you walk comfortably? 10    Patient Stated Goals be able to walk normally and function correctly.    Currently in Pain? No/denies                [facility] PT Assessment - 06/29/21 0001       Assessment   Medical Diagnosis gait training    Referring Provider (PT) Noreene Larsson, NP      Transfers   Five time sit to stand comments  21 seconds                            [facility] Adult PT Treatment/Exercise - 06/29/21 0001       Knee/Hip Exercises: Standing   Other Standing Knee Exercises side stepping - one hand support no resistance band cues to stand tall and straighten knee x5 B; backwards walking 6 minutes in // bars with cues to straighten and walk with big steps.    Other Standing Knee Exercises lateral foot taps 6" step, one hand on rail x2 30 foot taps bilateral - cues to stand tall  Knee/Hip Exercises: Seated   Sit to Sand without UE support;15 reps   with UE momntum                    PT Education - 06/29/21 1016     Education Details with transition to [facility], on current presentation, on plan moving forward, on current presentation with dizziness.on plan moving forward and HEP    Person(s) Educated Patient    Methods Explanation    Comprehension Verbalized understanding              PT Short Term Goals - 06/29/21 1014       PT SHORT TERM GOAL #1   Title Patient will be independent in self management strategies to improve quality of life and functional outcomes.    Baseline Reports compliance and going to the [facility]    Time 3    Period Weeks    Status Achieved    Target Date 04/27/21      PT SHORT TERM GOAL #2   Title Patient will be able to report at least 25% improvement in overall mobility.    Time 3    Period Weeks    Status Achieved    Target Date 04/27/21      PT SHORT TERM GOAL #3   Title Demo improved balance and BLE strength as evidenced by time of 18 sec 5xSTS test    Baseline 20.71 seconds - light headed    Time 3    Period Weeks    Status Partially Met    Target Date 04/27/21               PT Long Term Goals - 06/04/21 1202       PT LONG TERM GOAL #1   Title Patient will improve FOTO score by at least 10 points in order to indicate improved tolerance to activity.    Baseline 24% decrease    Time 6    Period Weeks    Status On-going      PT LONG TERM GOAL  #2   Title Demo improved gait velocity and safety as evidenced by distance of 150 ft during 2MWT    Baseline 60 ft with rollator    Time 6    Period Weeks    Status On-going      PT LONG TERM GOAL #3   Title Patient will not have foot wound present to reduce risk of infection.    Time 4    Period Weeks    Target Date 07/02/21      PT LONG TERM GOAL #4   Title Patient will verbalize proper foot care techniques.    Time 4    Period Weeks    Status New    Target Date 07/02/21                   Plan - 06/29/21 1019     Clinical Impression Statement Discussed current presentation and patient with desire to extend PT for a bit longer. Started assessment but patient with reports of light headedness, discussed following up with MD which patient agreed as he ate a well balanced breakfast and drank 2 glasses of water this morning. Will reassess next session and continued with POC.    Personal Factors and Comorbidities Comorbidity 1;Comorbidity 3+;Comorbidity 2    Comorbidities CHF, left knee surgery, right chronic foot wound, DB, restless leg syndromes    Examination-Activity Limitations Bathing;Lift;Locomotion Level;Transfers;Stand;Stairs;Squat;Reach Overhead;Carry  Examination-Participation Restrictions Community Activity;Meal Prep    Stability/Clinical Decision Making Evolving/Moderate complexity    Rehab Potential Fair    PT Frequency 2x / week    PT Duration 4 weeks    PT Treatment/Interventions ADLs/Self Care Home Management;Other (comment);Moist Heat;Balance training;Therapeutic exercise;Therapeutic activities;Functional mobility training;Stair training;Gait training;DME Instruction;Ultrasound;Neuromuscular re-education;Patient/family education;Orthotic Fit/Training;Manual techniques;Taping;Passive range of motion;Electrical Stimulation    PT Next Visit Plan PN - extend additional 4 weeks if appropriate. focus on gait mechancis, side stepping, balance    PT Home Exercise  Plan hip abd/add isometric, QS, hamstring stretch;  04/15/21: Isometric abd, abd supine, SAQ, bridge 9/1 clamshell, sideyling hip abduciton 9/21 standing hip abduction/ extenison, knee flexion, heel raise/ toe raise    Consulted and Agree with Plan of Care Patient             Patient will benefit from skilled therapeutic intervention in order to improve the following deficits and impairments:  Abnormal gait, Decreased endurance, Decreased skin integrity, Increased edema, Decreased activity tolerance, Decreased balance, Decreased mobility, Decreased knowledge of use of DME, Decreased strength, Difficulty walking, Pain, Decreased range of motion, Impaired flexibility, Postural dysfunction, Improper body mechanics  Visit Diagnosis: Difficulty in walking, not elsewhere classified  Muscle weakness (generalized)  Unsteadiness on feet     Problem List Patient Active Problem List   Diagnosis Date Noted   Hypokalemia 05/18/2021   Muscular deconditioning 05/12/2021   Frequent falls 05/12/2021   Fatigue 04/29/2021   Recent unexplained weight loss 04/23/2021   Left shoulder pain 04/23/2021   Chest tightness 04/23/2021   Deep tissue injury 04/23/2021   Myoclonus 04/15/2021   Pneumonia 04/03/2021   PNA (pneumonia) 04/02/2021   Abnormal finding on urinalysis 04/01/2021   Syncope 02/16/2021   Minor head injury    Right hip pain 02/11/2021   Erectile dysfunction 01/22/2021   Gait abnormality 09/30/2020   Mild non proliferative diabetic retinopathy ([place]) 07/16/2020   Need for immunization against influenza 06/03/2020   S/P left knee arthroscopy 05/06/20 05/13/2020   Abnormal MRI 03/26/2020   Joint disorder of knee 03/26/2020   Fall at home, initial encounter 03/13/2020   Anemia 09/03/2019   Reflux esophagitis 09/03/2019   Esophageal dysphagia 05/30/2019   Constipation 05/30/2019   Positive colorectal cancer screening using Cologuard test 05/30/2019   Diabetic foot ulcer ([place])  09/06/2018   Orthostasis 08/25/2016   Congestive heart failure ([place]) 05/17/2016   Bilateral carotid artery disease ([place]) 07/08/2014   Obstructive sleep apnea 02/12/2014   Restless legs 10/23/2013   Overweight with body mass index (BMI) of 29 to 29.9 in adult 10/23/2013   Hyperlipidemia 07/16/2013   Peripheral arterial disease ([place]) 07/16/2013   Hypertension 03/14/2012   Type 2 diabetes mellitus with circulatory disorder ([place]) 03/14/2012   Peripheral neuropathy 03/14/2012   Cardiomyopathy- EF NL 01/2013, now 30-35% echo 03/14/2012   10:46 AM, 06/29/21 [name], DPT Physical Therapy with [facility]  [phone] office   [facility] [address] [place], [place], [address] Phone: [phone]   Fax:  [phone]  Name: [name] MRN: [id number] Date of Birth: 03-04-1946

## 2021-06-30 DIAGNOSIS — H52203 Unspecified astigmatism, bilateral: Secondary | ICD-10-CM | POA: Diagnosis not present

## 2021-06-30 DIAGNOSIS — Z961 Presence of intraocular lens: Secondary | ICD-10-CM | POA: Diagnosis not present

## 2021-06-30 DIAGNOSIS — E113293 Type 2 diabetes mellitus with mild nonproliferative diabetic retinopathy without macular edema, bilateral: Secondary | ICD-10-CM | POA: Diagnosis not present

## 2021-06-30 LAB — HM DIABETES EYE EXAM

## 2021-07-01 ENCOUNTER — Ambulatory Visit (HOSPITAL_COMMUNITY): Payer: HMO | Admitting: Physical Therapy

## 2021-07-01 ENCOUNTER — Encounter: Payer: Self-pay | Admitting: *Deleted

## 2021-07-01 ENCOUNTER — Encounter (HOSPITAL_COMMUNITY): Payer: Self-pay | Admitting: Physical Therapy

## 2021-07-01 ENCOUNTER — Encounter (HOSPITAL_COMMUNITY): Payer: Self-pay

## 2021-07-01 ENCOUNTER — Other Ambulatory Visit: Payer: Self-pay

## 2021-07-01 ENCOUNTER — Ambulatory Visit (HOSPITAL_COMMUNITY): Payer: HMO

## 2021-07-01 DIAGNOSIS — R262 Difficulty in walking, not elsewhere classified: Secondary | ICD-10-CM

## 2021-07-01 DIAGNOSIS — M6281 Muscle weakness (generalized): Secondary | ICD-10-CM | POA: Diagnosis not present

## 2021-07-01 DIAGNOSIS — M25612 Stiffness of left shoulder, not elsewhere classified: Secondary | ICD-10-CM

## 2021-07-01 DIAGNOSIS — G8929 Other chronic pain: Secondary | ICD-10-CM

## 2021-07-01 DIAGNOSIS — R29898 Other symptoms and signs involving the musculoskeletal system: Secondary | ICD-10-CM

## 2021-07-01 DIAGNOSIS — R2681 Unsteadiness on feet: Secondary | ICD-10-CM

## 2021-07-01 NOTE — Therapy (Signed)
[facility] [address] [place], [place], [address] Phone: [phone]   Fax:  [phone]  Occupational Therapy Treatment  Patient Details  Name: [name] MRN: [id number] Date of Birth: September 27, 1945 Referring Provider (OT): Demetrius Revel, NP   Encounter Date: 07/01/2021   OT End of Session - 07/01/21 0947     Visit Number 12    Number of Visits 16    Date for OT Re-Evaluation 07/13/21    Authorization Type [employer]; $30 copay    Authorization Time Period no visit limit    Progress Note Due on Visit 20    OT Start Time 0900    OT Stop Time 0938    OT Time Calculation (min) 38 min    Activity Tolerance Patient tolerated treatment well    Behavior During Therapy [facility] for tasks assessed/performed             Past Medical History:  Diagnosis Date   Aftercare following surgery of the circulatory system, NEC 12/04/2013   Angina decubitus ([place]) 05/19/2016   Arrhythmia 05/20/2016   Arthritis    ARTHRITIS, RIGHT FOOT 06/26/2008   Qualifier: Diagnosis of  By: Aline Brochure MD, [name]     Cardiomyopathy- EF NL 01/2013, now 30-35% echo 03/14/2012   Carotid artery occlusion    left s/p CEA   Cellulitis of left foot 05/07/2018   CHF (congestive heart failure) ([place])    Critical lower limb ischemia ([place]) 02/28/2014   Critical limb ischemia    Diabetes mellitus    Elevated troponin 04/11/2016   GERD (gastroesophageal reflux disease)    History of stroke June 2013 03/14/2012   HOH (hard of hearing)    Hypertension    Hypothyroidism    Nonhealing skin ulcer ([place]) 10/28/2014   Obstructive sleep apnea    Orthostatic hypotension    Osteomyelitis ([place])    left great toe   Pain in the chest 05/17/2016   Peripheral arterial disease ([place]), s/p PTA x 2 RLE    nonhealing ulcers bilaterally on each great toe   Pneumonia    PONV (postoperative nausea and vomiting)    Pulmonary nodule 05/17/2016   Restless leg syndrome    Shortness of  breath    Stroke [facility]) March 08, 2012   Toe osteomyelitis, left [facility])    Ulcer of great toe, left, with necrosis of bone ([place])    Wears dentures     Past Surgical History:  Procedure Laterality Date   AMPUTATION TOE Left 07/25/2018   Procedure: AMPUTATION TOE INTERPHALANGEAL HALLUX LEFT;  Surgeon: Evelina Bucy, DPM;  Location: [facility];  Service: Podiatry;  Laterality: Left;   ANGIOPLASTY  02/28/14   diamond back orbital rotational atherectomy of Rt. tibial   BACK SURGERY     BONE BIOPSY Left 07/25/2018   Procedure: SUPERFICIAL BONE BIOPSY;  Surgeon: Evelina Bucy, DPM;  Location: [facility];  Service: Podiatry;  Laterality: Left;   CARDIAC CATHETERIZATION N/A 05/19/2016   Procedure: Right/Left Heart Cath and Coronary Angiography;  Surgeon: Belva Crome, MD;  Location: [facility] CV LAB;  Service: Cardiovascular;  Laterality: N/A;   CERVICAL FUSION     ENDARTERECTOMY Left 11/29/2013   Procedure: ENDARTERECTOMY CAROTID;  Surgeon: Serafina Mitchell, MD;  Location: [facility];  Service: Vascular;  Laterality: Left;   ESOPHAGOGASTRODUODENOSCOPY  02/2010   Dr. Gala Romney: patient presented with food impaction, schatzki ring with superimposed component of stricture with erosive reflux esophagitis, s/p disimpaction  but dilation planned at later date    ESOPHAGOGASTRODUODENOSCOPY (EGD) WITH PROPOFOL N/A 08/13/2019   Dr. Gala Romney: Erosive reflux esophagitis with mild stricture and incidental Mallory-Weiss tear which precluded esophageal dilation.  Medium sized hiatal hernia.   ESOPHAGOGASTRODUODENOSCOPY (EGD) WITH PROPOFOL N/A 10/11/2019   Dr. Gala Romney: Esophageal stenosis status post dilation, moderate hiatal hernia   EYE SURGERY     FLEXIBLE SIGMOIDOSCOPY N/A 08/13/2019   Procedure: FLEXIBLE SIGMOIDOSCOPY;  Surgeon: [name], MD;  [facility];  Service: Endoscopy;  Laterality: N/A;  colonoscopy aboerted due to formed stool and poor prep   FOOT SURGERY     KNEE ARTHROSCOPY WITH MEDIAL MENISECTOMY  Left 05/06/2020   Procedure: KNEE ARTHROSCOPY WITH MEDIAL MENISCECTOMY AND LATERAL MENISCECTOMY;  Surgeon: [name], MD;  [facility];  Service: Orthopedics;  Laterality: Left;   Lower ext duplex doppler  03/14/14   Rt ABI 1.2   LOWER EXTREMITY ANGIOGRAM Bilateral 02/18/2014   Procedure: LOWER EXTREMITY ANGIOGRAM;  Surgeon: [name], MD;  Location: [facility] CATH LAB;  Service: Cardiovascular;  Laterality: Bilateral;   LOWER EXTREMITY ANGIOGRAM N/A 10/31/2014   Procedure: LOWER EXTREMITY ANGIOGRAM;  Surgeon: [name], MD;  Location: [facility] CATH LAB;  Service: Cardiovascular;  Laterality: N/A;   MALONEY DILATION N/A 10/11/2019   Procedure: Venia Minks DILATION;  Surgeon: [name], MD;  [facility];  Service: Endoscopy;  Laterality: N/A;   MULTIPLE TOOTH EXTRACTIONS     PV angiogram  02/18/2014   tibial vessel diseas bil.   SPINE SURGERY     tendon achillies lengthing and sesamoid      There were no vitals filed for this visit.   Subjective Assessment - 07/01/21 0911     Subjective  S: No pain today.    Currently in Pain? No/denies                [facility] OT Assessment - 07/01/21 0911       Assessment   Medical Diagnosis Chronic left shoulder pain      Precautions   Precautions Fall                      OT Treatments/Exercises (OP) - 07/01/21 0911       Exercises   Exercises Shoulder      Shoulder Exercises: Supine   Other Supine Exercises Dynamic progressive stretching; shoulder external rotation combined with abduction      Shoulder Exercises: ROM/Strengthening   UBE (Upper Arm Bike) Level 5 3' forward 3' reverse pace: 9.5-11.5    Over Head Lace seated. Patient laced chain starting from top to bottom then removed chain      Manual Therapy   Manual Therapy Soft tissue mobilization    Manual therapy comments Manual therapy completed prior to exercises    Soft tissue mobilization Provided soft tissue mobilization during  dynamic passive shoulder stretching. Combined abduction and external rotation focusing on achieving his left hand behind his head.                      OT Short Term Goals - 06/26/21 1022       OT SHORT TERM GOAL #1   Title Pt will be provided with and educated on HEP to improve mobility of LUE required during ADL completion.    Time 4    Period Weeks    Target Date 06/26/21      OT SHORT  TERM GOAL #2   Title Pt will decrease pain in LUE to 3/10 or less to improve ability to sleep for 3+ hours without waking due to pain.    Baseline 10/3: Pt reports that his is able to sleep 3-4 hours before the pain will wake him up if he sleeps on his left arm.    Time 4    Period Weeks      OT SHORT TERM GOAL #3   Title Pt will decrease LUE fascial restrictions to min amounts or less to improve ability to complete functional reaching tasks.    Baseline 9/9: Pt reports continued deficits in functional reaching.    Time 4    Period Weeks      OT SHORT TERM GOAL #4   Title Pt will increase LUE A/ROM to [facility] to improve ability to reach for clothes or items in closet.    Time 4    Period Weeks    Status On-going      OT SHORT TERM GOAL #5   Title Pt will increase LUE strength to 4/5 or greater to improve ability to lift cast iron skillet during meal prep tasks.    Baseline 10/3: Patient demonstrates a 3+/5 for external rotation and 5/5 for remainder of shoulder ranges.    Time 4    Period Weeks    Status Partially Met                      Plan - 07/01/21 0948     Clinical Impression Statement A: Continued with use of UBE for shoulder and scapular strength and endurance followed by shoulder stability and functional reaching with use of overhead lacing. Due muscle fatigue did need to stand to finish last two chain links to remove. Completed dynamic progressive stretching supine with moist heat placed on left shoulder while patient waited for PT appointment.    Body  Structure / Function / Physical Skills ADL;Endurance;UE functional use;Fascial restriction;Pain;ROM;IADL;Strength    Plan P: Continue with working on increasing functional reaching. Work on external rotation ability.    Consulted and Agree with Plan of Care Patient             Patient will benefit from skilled therapeutic intervention in order to improve the following deficits and impairments:   Body Structure / Function / Physical Skills: ADL, Endurance, UE functional use, Fascial restriction, Pain, ROM, IADL, Strength       Visit Diagnosis: Chronic left shoulder pain  Other symptoms and signs involving the musculoskeletal system  Stiffness of left shoulder, not elsewhere classified    Problem List Patient Active Problem List   Diagnosis Date Noted   Hypokalemia 05/18/2021   Muscular deconditioning 05/12/2021   Frequent falls 05/12/2021   Fatigue 04/29/2021   Recent unexplained weight loss 04/23/2021   Left shoulder pain 04/23/2021   Chest tightness 04/23/2021   Deep tissue injury 04/23/2021   Myoclonus 04/15/2021   Pneumonia 04/03/2021   PNA (pneumonia) 04/02/2021   Abnormal finding on urinalysis 04/01/2021   Syncope 02/16/2021   Minor head injury    Right hip pain 02/11/2021   Erectile dysfunction 01/22/2021   Gait abnormality 09/30/2020   Mild non proliferative diabetic retinopathy ([place]) 07/16/2020   Need for immunization against influenza 06/03/2020   S/P left knee arthroscopy 05/06/20 05/13/2020   Abnormal MRI 03/26/2020   Joint disorder of knee 03/26/2020   Fall at home, initial encounter 03/13/2020   Anemia 09/03/2019  Reflux esophagitis 09/03/2019   Esophageal dysphagia 05/30/2019   Constipation 05/30/2019   Positive colorectal cancer screening using Cologuard test 05/30/2019   Diabetic foot ulcer ([place]) 09/06/2018   Orthostasis 08/25/2016   Congestive heart failure ([place]) 05/17/2016   Bilateral carotid artery disease ([place]) 07/08/2014   Obstructive  sleep apnea 02/12/2014   Restless legs 10/23/2013   Overweight with body mass index (BMI) of 29 to 29.9 in adult 10/23/2013   Hyperlipidemia 07/16/2013   Peripheral arterial disease ([place]) 07/16/2013   Hypertension 03/14/2012   Type 2 diabetes mellitus with circulatory disorder ([place]) 03/14/2012   Peripheral neuropathy 03/14/2012   Cardiomyopathy- EF NL 01/2013, now 30-35% echo 03/14/2012    Ailene Ravel, OTR/L,CBIS  [phone]  07/01/2021, 9:53 AM  [facility] [place], [place], [address] Phone: [phone]   Fax:  [phone]  Name: [name] MRN: [id number] Date of Birth: 10-21-1945

## 2021-07-01 NOTE — Therapy (Signed)
[facility] [address] [place], [place], [address] Phone: [phone]   Fax:  [phone]  Physical Therapy Treatment/Progress Note/Recert  Patient Details  Name: [name] MRN: [id number] Date of Birth: 1946-08-16 Referring Provider (PT): Noreene Larsson, NP   Encounter Date: 07/01/2021 Progress Note   Reporting Period 05/21/21 to 07/01/21   See note below for Objective Data and Assessment of Progress/Goals   PT End of Session - 07/01/21 1006     Visit Number 19    Number of Visits 34    Date for PT Re-Evaluation 07/29/21    Authorization Type [employer], no VL, no auth ,    Progress Note Due on Visit 29    PT Start Time 1005    PT Stop Time 1045    PT Time Calculation (min) 40 min    Equipment Utilized During Treatment Gait belt    Activity Tolerance Patient tolerated treatment well    Behavior During Therapy [facility] for tasks assessed/performed             Past Medical History:  Diagnosis Date   Aftercare following surgery of the circulatory system, NEC 12/04/2013   Angina decubitus ([place]) 05/19/2016   Arrhythmia 05/20/2016   Arthritis    ARTHRITIS, RIGHT FOOT 06/26/2008   Qualifier: Diagnosis of  By: Aline Brochure MD, [name]     Cardiomyopathy- EF NL 01/2013, now 30-35% echo 03/14/2012   Carotid artery occlusion    left s/p CEA   Cellulitis of left foot 05/07/2018   CHF (congestive heart failure) ([place])    Critical lower limb ischemia ([place]) 02/28/2014   Critical limb ischemia    Diabetes mellitus    Elevated troponin 04/11/2016   GERD (gastroesophageal reflux disease)    History of stroke June 2013 03/14/2012   HOH (hard of hearing)    Hypertension    Hypothyroidism    Nonhealing skin ulcer ([place]) 10/28/2014   Obstructive sleep apnea    Orthostatic hypotension    Osteomyelitis ([place])    left great toe   Pain in the chest 05/17/2016   Peripheral arterial disease ([place]), s/p PTA x 2 RLE    nonhealing ulcers  bilaterally on each great toe   Pneumonia    PONV (postoperative nausea and vomiting)    Pulmonary nodule 05/17/2016   Restless leg syndrome    Shortness of breath    Stroke [facility]) March 08, 2012   Toe osteomyelitis, left [facility])    Ulcer of great toe, left, with necrosis of bone ([place])    Wears dentures     Past Surgical History:  Procedure Laterality Date   AMPUTATION TOE Left 07/25/2018   Procedure: AMPUTATION TOE INTERPHALANGEAL HALLUX LEFT;  Surgeon: Evelina Bucy, DPM;  Location: [facility];  Service: Podiatry;  Laterality: Left;   ANGIOPLASTY  02/28/14   diamond back orbital rotational atherectomy of Rt. tibial   BACK SURGERY     BONE BIOPSY Left 07/25/2018   Procedure: SUPERFICIAL BONE BIOPSY;  Surgeon: Evelina Bucy, DPM;  Location: [facility];  Service: Podiatry;  Laterality: Left;   CARDIAC CATHETERIZATION N/A 05/19/2016   Procedure: Right/Left Heart Cath and Coronary Angiography;  Surgeon: Belva Crome, MD;  Location: [facility] CV LAB;  Service: Cardiovascular;  Laterality: N/A;   CERVICAL FUSION     ENDARTERECTOMY Left 11/29/2013   Procedure: ENDARTERECTOMY CAROTID;  Surgeon: Serafina Mitchell, MD;  Location: [facility];  Service: Vascular;  Laterality: Left;  ESOPHAGOGASTRODUODENOSCOPY  02/2010   Dr. Gala Romney: patient presented with food impaction, schatzki ring with superimposed component of stricture with erosive reflux esophagitis, s/p disimpaction but dilation planned at later date    ESOPHAGOGASTRODUODENOSCOPY (EGD) WITH PROPOFOL N/A 08/13/2019   Dr. Gala Romney: Erosive reflux esophagitis with mild stricture and incidental Mallory-Weiss tear which precluded esophageal dilation.  Medium sized hiatal hernia.   ESOPHAGOGASTRODUODENOSCOPY (EGD) WITH PROPOFOL N/A 10/11/2019   Dr. Gala Romney: Esophageal stenosis status post dilation, moderate hiatal hernia   EYE SURGERY     FLEXIBLE SIGMOIDOSCOPY N/A 08/13/2019   Procedure: FLEXIBLE SIGMOIDOSCOPY;  Surgeon: [name], MD;  [facility];  Service: Endoscopy;  Laterality: N/A;  colonoscopy aboerted due to formed stool and poor prep   FOOT SURGERY     KNEE ARTHROSCOPY WITH MEDIAL MENISECTOMY Left 05/06/2020   Procedure: KNEE ARTHROSCOPY WITH MEDIAL MENISCECTOMY AND LATERAL MENISCECTOMY;  Surgeon: [name], MD;  Location: [facility];  Service: Orthopedics;  Laterality: Left;   Lower ext duplex doppler  03/14/14   Rt ABI 1.2   LOWER EXTREMITY ANGIOGRAM Bilateral 02/18/2014   Procedure: LOWER EXTREMITY ANGIOGRAM;  Surgeon: [name], MD;  Location: [facility] CATH LAB;  Service: Cardiovascular;  Laterality: Bilateral;   LOWER EXTREMITY ANGIOGRAM N/A 10/31/2014   Procedure: LOWER EXTREMITY ANGIOGRAM;  Surgeon: [name], MD;  Location: [facility] CATH LAB;  Service: Cardiovascular;  Laterality: N/A;   MALONEY DILATION N/A 10/11/2019   Procedure: Venia Minks DILATION;  Surgeon: [name], MD;  [facility];  Service: Endoscopy;  Laterality: N/A;   MULTIPLE TOOTH EXTRACTIONS     PV angiogram  02/18/2014   tibial vessel diseas bil.   SPINE SURGERY     tendon achillies lengthing and sesamoid      There were no vitals filed for this visit.   Subjective Assessment - 07/01/21 1007     Subjective Patient states 25-50% improvement with PT intervention. Exercises are going well, slowly but surely. has been doing the [facility].    Pertinent History CHF , R foot wound, left knee scope, DB, Restless leg syndrome, hx of stroke.    Limitations House hold activities;Standing;Lifting    How long can you walk comfortably? 10    Patient Stated Goals be able to walk normally and function correctly.    Currently in Pain? No/denies                [facility] PT Assessment - 07/01/21 1015       Assessment   Medical Diagnosis R foot ulcer and gait instability    Referring Provider (PT) Noreene Larsson, NP      Home Environment   Living Environment Private residence    Living Arrangements Spouse/significant other    Available  Help at Discharge Family    Type of [place] to enter    Entrance Stairs-Number of Steps 2    Entrance Stairs-Rails None    Home Layout Multi-level    Alternate Level Stairs-Number of Steps 11    Alternate Level Stairs-Rails Right    [place] - 4 wheels      Prior Function   Level of Independence Independent with household mobility with device;Independent with transfers    Vocation Retired      Observation/Other Assessments   Observations ambulates with rollator    Focus on Therapeutic Outcomes (FOTO)  52% function      Functional Tests  Functional tests Sit to Stand      Sit to Stand   Comments --      Strength   Right Hip Flexion 5/5   was 3/5   Right Hip Extension 3/5   was 2+   Right Hip ABduction 3/5   was 3-   Right Hip ADduction 3/5    Left Hip Flexion 5/5   was 3/5   Left Hip Extension 2+/5   was 2+   Left Hip ABduction 3-/5   was 3-   Left Hip ADduction 3/5    Right Knee Flexion 5/5    Right Knee Extension 5/5    Left Knee Flexion 5/5    Left Knee Extension 5/5   was 3/5   Right Ankle Dorsiflexion 4+/5    Right Ankle Plantar Flexion 3-/5    Left Ankle Dorsiflexion 5/5    Left Ankle Plantar Flexion 3+/5      Transfers   Five time sit to stand comments  13 seconds without UE use      Ambulation/Gait   Ambulation/Gait Yes    Ambulation Distance (Feet) 250 Feet    Assistive device Rollator    Gait Comments 2MWT                           [facility] Adult PT Treatment/Exercise - 07/01/21 1015       Knee/Hip Exercises: Standing   Hip Abduction Both;3 sets;10 reps    Abduction Limitations 5# foot hits ground and pushes back    Hip Extension Both;3 sets;10 reps    Extension Limitations 5# tap backs                     PT Education - 07/01/21 1006     Education Details HEP, reassessment findings    Person(s) Educated Patient    Methods Explanation;Demonstration    Comprehension Verbalized  understanding;Returned demonstration              PT Short Term Goals - 07/01/21 1022       PT SHORT TERM GOAL #1   Title Patient will be independent in self management strategies to improve quality of life and functional outcomes.    Baseline Reports compliance and going to the [facility]    Time 3    Period Weeks    Status Achieved    Target Date 04/27/21      PT SHORT TERM GOAL #2   Title Patient will be able to report at least 25% improvement in overall mobility.    Time 3    Period Weeks    Status Achieved    Target Date 04/27/21      PT SHORT TERM GOAL #3   Title Demo improved balance and BLE strength as evidenced by time of 18 sec 5xSTS test    Baseline 20.71 seconds - light headed 10/12 13 seconds    Time 3    Period Weeks    Status Achieved    Target Date 04/27/21               PT Long Term Goals - 07/01/21 1023       PT LONG TERM GOAL #1   Title Patient will improve FOTO score by at least 10 points in order to indicate improved tolerance to activity.    Baseline 2% increase    Time 6    Period Weeks    Status  On-going      PT LONG TERM GOAL #2   Title Demo improved gait velocity and safety as evidenced by distance of 150 ft during 2MWT    Baseline 60 ft with rollator 10/12 250 feet with rollator    Time 6    Period Weeks    Status Achieved      PT LONG TERM GOAL #3   Title Patient will not have foot wound present to reduce risk of infection.    Time 4    Period Weeks    Status Achieved      PT LONG TERM GOAL #4   Title Patient will verbalize proper foot care techniques.    Time 4    Period Weeks    Status Achieved      PT LONG TERM GOAL #5   Title Patient will be able to ambulate at least 350 feet in 2MWT in order to demonstrate improving endurance and activity tolerance.    Time 4    Period Weeks    Status New    Target Date 07/29/21                   Plan - 07/01/21 1006     Clinical Impression Statement Patient has met  3/3 short term goals and 3/4 long term goals with ability to complete HEP, improvement in symptoms, strength, gait, functional mobility, and wound being healed. Patient continues to remain limited by impaired activity tolerance, glute strength, and ambulation ability. New goal added for improving ambulation ability. Cueing given today for reducing UE support to improve LE stability and hip strength. Patient will continue to benefit from skilled physical therapy in order to reduce impairment and improve function.    Personal Factors and Comorbidities Comorbidity 1;Comorbidity 3+;Comorbidity 2    Comorbidities CHF, left knee surgery, right chronic foot wound, DB, restless leg syndromes    Examination-Activity Limitations Bathing;Lift;Locomotion Level;Transfers;Stand;Stairs;Squat;Reach Overhead;Carry    Examination-Participation Restrictions Community Activity;Meal Prep    Stability/Clinical Decision Making Evolving/Moderate complexity    Rehab Potential Fair    PT Frequency 2x / week    PT Duration 4 weeks    PT Treatment/Interventions ADLs/Self Care Home Management;Other (comment);Moist Heat;Balance training;Therapeutic exercise;Therapeutic activities;Functional mobility training;Stair training;Gait training;DME Instruction;Ultrasound;Neuromuscular re-education;Patient/family education;Orthotic Fit/Training;Manual techniques;Taping;Passive range of motion;Electrical Stimulation    PT Next Visit Plan focus on gait mechancis, side stepping, balance; glute strength    PT Home Exercise Plan hip abd/add isometric, QS, hamstring stretch;  04/15/21: Isometric abd, abd supine, SAQ, bridge 9/1 clamshell, sideyling hip abduciton 9/21 standing hip abduction/ extenison, knee flexion, heel raise/ toe raise    Consulted and Agree with Plan of Care Patient             Patient will benefit from skilled therapeutic intervention in order to improve the following deficits and impairments:  Abnormal gait, Decreased  endurance, Decreased skin integrity, Increased edema, Decreased activity tolerance, Decreased balance, Decreased mobility, Decreased knowledge of use of DME, Decreased strength, Difficulty walking, Pain, Decreased range of motion, Impaired flexibility, Postural dysfunction, Improper body mechanics  Visit Diagnosis: Difficulty in walking, not elsewhere classified  Muscle weakness (generalized)  Unsteadiness on feet     Problem List Patient Active Problem List   Diagnosis Date Noted   Hypokalemia 05/18/2021   Muscular deconditioning 05/12/2021   Frequent falls 05/12/2021   Fatigue 04/29/2021   Recent unexplained weight loss 04/23/2021   Left shoulder pain 04/23/2021   Chest tightness 04/23/2021   Deep tissue  injury 04/23/2021   Myoclonus 04/15/2021   Pneumonia 04/03/2021   PNA (pneumonia) 04/02/2021   Abnormal finding on urinalysis 04/01/2021   Syncope 02/16/2021   Minor head injury    Right hip pain 02/11/2021   Erectile dysfunction 01/22/2021   Gait abnormality 09/30/2020   Mild non proliferative diabetic retinopathy ([place]) 07/16/2020   Need for immunization against influenza 06/03/2020   S/P left knee arthroscopy 05/06/20 05/13/2020   Abnormal MRI 03/26/2020   Joint disorder of knee 03/26/2020   Fall at home, initial encounter 03/13/2020   Anemia 09/03/2019   Reflux esophagitis 09/03/2019   Esophageal dysphagia 05/30/2019   Constipation 05/30/2019   Positive colorectal cancer screening using Cologuard test 05/30/2019   Diabetic foot ulcer ([place]) 09/06/2018   Orthostasis 08/25/2016   Congestive heart failure ([place]) 05/17/2016   Bilateral carotid artery disease ([place]) 07/08/2014   Obstructive sleep apnea 02/12/2014   Restless legs 10/23/2013   Overweight with body mass index (BMI) of 29 to 29.9 in adult 10/23/2013   Hyperlipidemia 07/16/2013   Peripheral arterial disease ([place]) 07/16/2013   Hypertension 03/14/2012   Type 2 diabetes mellitus with circulatory disorder  ([place]) 03/14/2012   Peripheral neuropathy 03/14/2012   Cardiomyopathy- EF NL 01/2013, now 30-35% echo 03/14/2012    10:47 AM, 07/01/21 Mearl Latin PT, DPT Physical Therapist at [facility] [place], [place], [address] Phone: [phone]   Fax:  [phone]  Name: [name] MRN: [id number] Date of Birth: November 06, 1945

## 2021-07-03 ENCOUNTER — Other Ambulatory Visit: Payer: Self-pay

## 2021-07-03 ENCOUNTER — Encounter (HOSPITAL_COMMUNITY): Payer: Self-pay | Admitting: Occupational Therapy

## 2021-07-03 ENCOUNTER — Ambulatory Visit (HOSPITAL_COMMUNITY): Payer: HMO | Admitting: Occupational Therapy

## 2021-07-03 ENCOUNTER — Ambulatory Visit (HOSPITAL_COMMUNITY): Payer: HMO | Admitting: Physical Therapy

## 2021-07-03 DIAGNOSIS — R29898 Other symptoms and signs involving the musculoskeletal system: Secondary | ICD-10-CM

## 2021-07-03 DIAGNOSIS — M25612 Stiffness of left shoulder, not elsewhere classified: Secondary | ICD-10-CM

## 2021-07-03 DIAGNOSIS — M6281 Muscle weakness (generalized): Secondary | ICD-10-CM | POA: Diagnosis not present

## 2021-07-03 DIAGNOSIS — G8929 Other chronic pain: Secondary | ICD-10-CM

## 2021-07-03 NOTE — Therapy (Addendum)
[facility] [address] [place], [place], [address] Phone: [phone]   Fax:  [phone]  Occupational Therapy Treatment  Patient Details  Name: [name] MRN: [id number] Date of Birth: Jul 20, 1946 Referring Provider (OT): Demetrius Revel, NP   Encounter Date: 07/03/2021   OT End of Session - 07/03/21 1048     Visit Number 13    Number of Visits 16    Date for OT Re-Evaluation 07/13/21    Authorization Type [employer]; $30 copay    Authorization Time Period no visit limit    Progress Note Due on Visit 20    OT Start Time 1031    OT Stop Time 1059    OT Time Calculation (min) 28 min    Activity Tolerance Patient tolerated treatment well    Behavior During Therapy [facility] for tasks assessed/performed             Past Medical History:  Diagnosis Date   Aftercare following surgery of the circulatory system, NEC 12/04/2013   Angina decubitus ([place]) 05/19/2016   Arrhythmia 05/20/2016   Arthritis    ARTHRITIS, RIGHT FOOT 06/26/2008   Qualifier: Diagnosis of  By: Aline Brochure MD, [name]     Cardiomyopathy- EF NL 01/2013, now 30-35% echo 03/14/2012   Carotid artery occlusion    left s/p CEA   Cellulitis of left foot 05/07/2018   CHF (congestive heart failure) ([place])    Critical lower limb ischemia ([place]) 02/28/2014   Critical limb ischemia    Diabetes mellitus    Elevated troponin 04/11/2016   GERD (gastroesophageal reflux disease)    History of stroke June 2013 03/14/2012   HOH (hard of hearing)    Hypertension    Hypothyroidism    Nonhealing skin ulcer ([place]) 10/28/2014   Obstructive sleep apnea    Orthostatic hypotension    Osteomyelitis ([place])    left great toe   Pain in the chest 05/17/2016   Peripheral arterial disease ([place]), s/p PTA x 2 RLE    nonhealing ulcers bilaterally on each great toe   Pneumonia    PONV (postoperative nausea and vomiting)    Pulmonary nodule 05/17/2016   Restless leg syndrome    Shortness of  breath    Stroke [facility]) March 08, 2012   Toe osteomyelitis, left [facility])    Ulcer of great toe, left, with necrosis of bone ([place])    Wears dentures     Past Surgical History:  Procedure Laterality Date   AMPUTATION TOE Left 07/25/2018   Procedure: AMPUTATION TOE INTERPHALANGEAL HALLUX LEFT;  Surgeon: Evelina Bucy, DPM;  Location: [place];  Service: Podiatry;  Laterality: Left;   ANGIOPLASTY  02/28/14   diamond back orbital rotational atherectomy of Rt. tibial   BACK SURGERY     BONE BIOPSY Left 07/25/2018   Procedure: SUPERFICIAL BONE BIOPSY;  Surgeon: Evelina Bucy, DPM;  Location: [place];  Service: Podiatry;  Laterality: Left;   CARDIAC CATHETERIZATION N/A 05/19/2016   Procedure: Right/Left Heart Cath and Coronary Angiography;  Surgeon: Belva Crome, MD;  Location: [place] CV LAB;  Service: Cardiovascular;  Laterality: N/A;   CERVICAL FUSION     ENDARTERECTOMY Left 11/29/2013   Procedure: ENDARTERECTOMY CAROTID;  Surgeon: Serafina Mitchell, MD;  Location: [place];  Service: Vascular;  Laterality: Left;   ESOPHAGOGASTRODUODENOSCOPY  02/2010   Dr. Gala Romney: patient presented with food impaction, schatzki ring with superimposed component of stricture with erosive reflux esophagitis, s/p disimpaction  but dilation planned at later date    ESOPHAGOGASTRODUODENOSCOPY (EGD) WITH PROPOFOL N/A 08/13/2019   Dr. Gala Romney: Erosive reflux esophagitis with mild stricture and incidental Mallory-Weiss tear which precluded esophageal dilation.  Medium sized hiatal hernia.   ESOPHAGOGASTRODUODENOSCOPY (EGD) WITH PROPOFOL N/A 10/11/2019   Dr. Gala Romney: Esophageal stenosis status post dilation, moderate hiatal hernia   EYE SURGERY     FLEXIBLE SIGMOIDOSCOPY N/A 08/13/2019   Procedure: FLEXIBLE SIGMOIDOSCOPY;  Surgeon: [name], MD;  [facility];  Service: Endoscopy;  Laterality: N/A;  colonoscopy aboerted due to formed stool and poor prep   FOOT SURGERY     KNEE ARTHROSCOPY WITH MEDIAL MENISECTOMY  Left 05/06/2020   Procedure: KNEE ARTHROSCOPY WITH MEDIAL MENISCECTOMY AND LATERAL MENISCECTOMY;  Surgeon: [name], MD;  [facility];  Service: Orthopedics;  Laterality: Left;   Lower ext duplex doppler  03/14/14   Rt ABI 1.2   LOWER EXTREMITY ANGIOGRAM Bilateral 02/18/2014   Procedure: LOWER EXTREMITY ANGIOGRAM;  Surgeon: [name], MD;  Location: [facility] CATH LAB;  Service: Cardiovascular;  Laterality: Bilateral;   LOWER EXTREMITY ANGIOGRAM N/A 10/31/2014   Procedure: LOWER EXTREMITY ANGIOGRAM;  Surgeon: [name], MD;  Location: [facility] CATH LAB;  Service: Cardiovascular;  Laterality: N/A;   MALONEY DILATION N/A 10/11/2019   Procedure: Venia Minks DILATION;  Surgeon: [name], MD;  [facility];  Service: Endoscopy;  Laterality: N/A;   MULTIPLE TOOTH EXTRACTIONS     PV angiogram  02/18/2014   tibial vessel diseas bil.   SPINE SURGERY     tendon achillies lengthing and sesamoid      There were no vitals filed for this visit.   Subjective Assessment - 07/03/21 1031     Subjective  S: Pt reports needing to leave early today to make another appointment.    Currently in Pain? No/denies                [facility] OT Assessment - 07/03/21 0001       Assessment   Medical Diagnosis R foot ulcer and gait instability      Precautions   Precautions Fall                      OT Treatments/Exercises (OP) - 07/03/21 0001       Exercises   Exercises Shoulder      Shoulder Exercises: Supine   Other Supine Exercises Dynamic progressive stretching; shoulder external rotation combined with abduction      Shoulder Exercises: ROM/Strengthening   Over Head Lace seated. Patient laced chain starting from top to bottom then removed chain. Pt able to make it to 5 rings from the top of the chain before needing to leave session. Pt needed multiple breaks when removing rubber string form chain.      Manual Therapy   Manual Therapy Soft tissue mobilization     Manual therapy comments Manual therapy completed prior to exercises    Soft tissue mobilization Provided soft tissue mobilization during dynamic passive shoulder stretching. Combined abduction and external rotation focusing on achieving his left hand behind his head.                      OT Short Term Goals - 06/26/21 1022       OT SHORT TERM GOAL #1   Title Pt will be provided with and educated on HEP to improve mobility of LUE required during ADL  completion.    Time 4    Period Weeks    Target Date 06/26/21      OT SHORT TERM GOAL #2   Title Pt will decrease pain in LUE to 3/10 or less to improve ability to sleep for 3+ hours without waking due to pain.    Baseline 10/3: Pt reports that his is able to sleep 3-4 hours before the pain will wake him up if he sleeps on his left arm.    Time 4    Period Weeks      OT SHORT TERM GOAL #3   Title Pt will decrease LUE fascial restrictions to min amounts or less to improve ability to complete functional reaching tasks.    Baseline 9/9: Pt reports continued deficits in functional reaching.    Time 4    Period Weeks      OT SHORT TERM GOAL #4   Title Pt will increase LUE A/ROM to [facility] to improve ability to reach for clothes or items in closet.    Time 4    Period Weeks    Status On-going      OT SHORT TERM GOAL #5   Title Pt will increase LUE strength to 4/5 or greater to improve ability to lift cast iron skillet during meal prep tasks.    Baseline 10/3: Patient demonstrates a 3+/5 for external rotation and 5/5 for remainder of shoulder ranges.    Time 4    Period Weeks    Status Partially Met                      Plan - 07/03/21 1232     Clinical Impression Statement A: Continued dynamic progressive stretching in supine with moist heat on L shoulder. Pt was able to complete overhead lacing with good endurance to lace in the chain with more rest breaks and fatigue with removing rope from chain. Pt did not  complete task fully due to need to leave session early.    Body Structure / Function / Physical Skills ADL;Endurance;UE functional use;Fascial restriction;Pain;ROM;IADL;Strength    Plan P: Continue with working on increasing functional reaching. Work on external rotation ability. UBE bike.   OT Home Exercise Plan 8/16: A/ROM 10/3: Shoulder stretches: doorway and flexion    Consulted and Agree with Plan of Care Patient             Patient will benefit from skilled therapeutic intervention in order to improve the following deficits and impairments:   Body Structure / Function / Physical Skills: ADL, Endurance, UE functional use, Fascial restriction, Pain, ROM, IADL, Strength       Visit Diagnosis: Chronic left shoulder pain  Other symptoms and signs involving the musculoskeletal system  Stiffness of left shoulder, not elsewhere classified    Problem List Patient Active Problem List   Diagnosis Date Noted   Hypokalemia 05/18/2021   Muscular deconditioning 05/12/2021   Frequent falls 05/12/2021   Fatigue 04/29/2021   Recent unexplained weight loss 04/23/2021   Left shoulder pain 04/23/2021   Chest tightness 04/23/2021   Deep tissue injury 04/23/2021   Myoclonus 04/15/2021   Pneumonia 04/03/2021   PNA (pneumonia) 04/02/2021   Abnormal finding on urinalysis 04/01/2021   Syncope 02/16/2021   Minor head injury    Right hip pain 02/11/2021   Erectile dysfunction 01/22/2021   Gait abnormality 09/30/2020   Mild non proliferative diabetic retinopathy ([place]) 07/16/2020   Need for immunization against influenza 06/03/2020  S/P left knee arthroscopy 05/06/20 05/13/2020   Abnormal MRI 03/26/2020   Joint disorder of knee 03/26/2020   Fall at home, initial encounter 03/13/2020   Anemia 09/03/2019   Reflux esophagitis 09/03/2019   Esophageal dysphagia 05/30/2019   Constipation 05/30/2019   Positive colorectal cancer screening using Cologuard test 05/30/2019   Diabetic foot ulcer  ([place]) 09/06/2018   Orthostasis 08/25/2016   Congestive heart failure ([place]) 05/17/2016   Bilateral carotid artery disease ([place]) 07/08/2014   Obstructive sleep apnea 02/12/2014   Restless legs 10/23/2013   Overweight with body mass index (BMI) of 29 to 29.9 in adult 10/23/2013   Hyperlipidemia 07/16/2013   Peripheral arterial disease ([place]) 07/16/2013   Hypertension 03/14/2012   Type 2 diabetes mellitus with circulatory disorder ([place]) 03/14/2012   Peripheral neuropathy 03/14/2012   Cardiomyopathy- EF NL 01/2013, now 30-35% echo 03/14/2012   Larey Seat OT, MOT   Larey Seat, OT/L 07/03/2021, 12:36 PM  [place], [place], [address] Phone: [phone]   Fax:  [phone]  Name: TARRANCE JANUSZEWSKI MRN: [id number] Date of Birth: 1945-10-16

## 2021-07-07 ENCOUNTER — Other Ambulatory Visit: Payer: Self-pay

## 2021-07-07 ENCOUNTER — Ambulatory Visit (HOSPITAL_COMMUNITY): Payer: HMO | Admitting: Occupational Therapy

## 2021-07-07 ENCOUNTER — Ambulatory Visit (HOSPITAL_COMMUNITY): Payer: HMO | Admitting: Physical Therapy

## 2021-07-07 ENCOUNTER — Encounter (HOSPITAL_COMMUNITY): Payer: Self-pay | Admitting: Occupational Therapy

## 2021-07-07 DIAGNOSIS — R2681 Unsteadiness on feet: Secondary | ICD-10-CM

## 2021-07-07 DIAGNOSIS — M6281 Muscle weakness (generalized): Secondary | ICD-10-CM

## 2021-07-07 DIAGNOSIS — R29898 Other symptoms and signs involving the musculoskeletal system: Secondary | ICD-10-CM

## 2021-07-07 DIAGNOSIS — M25612 Stiffness of left shoulder, not elsewhere classified: Secondary | ICD-10-CM

## 2021-07-07 DIAGNOSIS — G8929 Other chronic pain: Secondary | ICD-10-CM

## 2021-07-07 DIAGNOSIS — R262 Difficulty in walking, not elsewhere classified: Secondary | ICD-10-CM

## 2021-07-07 NOTE — Therapy (Signed)
[facility] [address] [place], [place], [address] Phone: [phone]   Fax:  [phone]  Physical Therapy Treatment  Patient Details  Name: [name] MRN: [id number] Date of Birth: 1946/02/03 Referring Provider (PT): Noreene Larsson, NP   Encounter Date: 07/07/2021   PT End of Session - 07/07/21 1533     Visit Number 20    Number of Visits 34    Date for PT Re-Evaluation 07/29/21    Authorization Type [employer], no VL, no auth ,    Progress Note Due on Visit 29    PT Start Time 5625    PT Stop Time 1532    PT Time Calculation (min) 45 min    Equipment Utilized During Treatment Gait belt    Activity Tolerance Patient tolerated treatment well    Behavior During Therapy [facility] for tasks assessed/performed             Past Medical History:  Diagnosis Date   Aftercare following surgery of the circulatory system, NEC 12/04/2013   Angina decubitus ([place]) 05/19/2016   Arrhythmia 05/20/2016   Arthritis    ARTHRITIS, RIGHT FOOT 06/26/2008   Qualifier: Diagnosis of  By: Aline Brochure MD, [name]     Cardiomyopathy- EF NL 01/2013, now 30-35% echo 03/14/2012   Carotid artery occlusion    left s/p CEA   Cellulitis of left foot 05/07/2018   CHF (congestive heart failure) ([place])    Critical lower limb ischemia ([place]) 02/28/2014   Critical limb ischemia    Diabetes mellitus    Elevated troponin 04/11/2016   GERD (gastroesophageal reflux disease)    History of stroke June 2013 03/14/2012   HOH (hard of hearing)    Hypertension    Hypothyroidism    Nonhealing skin ulcer ([place]) 10/28/2014   Obstructive sleep apnea    Orthostatic hypotension    Osteomyelitis ([place])    left great toe   Pain in the chest 05/17/2016   Peripheral arterial disease ([place]), s/p PTA x 2 RLE    nonhealing ulcers bilaterally on each great toe   Pneumonia    PONV (postoperative nausea and vomiting)    Pulmonary nodule 05/17/2016   Restless leg syndrome     Shortness of breath    Stroke [facility]) March 08, 2012   Toe osteomyelitis, left [name])    Ulcer of great toe, left, with necrosis of bone ([place])    Wears dentures     Past Surgical History:  Procedure Laterality Date   AMPUTATION TOE Left 07/25/2018   Procedure: AMPUTATION TOE INTERPHALANGEAL HALLUX LEFT;  Surgeon: Evelina Bucy, DPM;  Location: [place];  Service: Podiatry;  Laterality: Left;   ANGIOPLASTY  02/28/14   diamond back orbital rotational atherectomy of Rt. tibial   BACK SURGERY     BONE BIOPSY Left 07/25/2018   Procedure: SUPERFICIAL BONE BIOPSY;  Surgeon: Evelina Bucy, DPM;  Location: [name];  Service: Podiatry;  Laterality: Left;   CARDIAC CATHETERIZATION N/A 05/19/2016   Procedure: Right/Left Heart Cath and Coronary Angiography;  Surgeon: Belva Crome, MD;  Location: [place] CV LAB;  Service: Cardiovascular;  Laterality: N/A;   CERVICAL FUSION     ENDARTERECTOMY Left 11/29/2013   Procedure: ENDARTERECTOMY CAROTID;  Surgeon: Serafina Mitchell, MD;  Location: [place];  Service: Vascular;  Laterality: Left;   ESOPHAGOGASTRODUODENOSCOPY  02/2010   Dr. Gala Romney: patient presented with food impaction, schatzki ring with superimposed component of stricture with erosive  reflux esophagitis, s/p disimpaction but dilation planned at later date    ESOPHAGOGASTRODUODENOSCOPY (EGD) WITH PROPOFOL N/A 08/13/2019   Dr. Gala Romney: Erosive reflux esophagitis with mild stricture and incidental Mallory-Weiss tear which precluded esophageal dilation.  Medium sized hiatal hernia.   ESOPHAGOGASTRODUODENOSCOPY (EGD) WITH PROPOFOL N/A 10/11/2019   Dr. Gala Romney: Esophageal stenosis status post dilation, moderate hiatal hernia   EYE SURGERY     FLEXIBLE SIGMOIDOSCOPY N/A 08/13/2019   Procedure: FLEXIBLE SIGMOIDOSCOPY;  Surgeon: [name], MD;  [facility];  Service: Endoscopy;  Laterality: N/A;  colonoscopy aboerted due to formed stool and poor prep   FOOT SURGERY     KNEE ARTHROSCOPY WITH MEDIAL  MENISECTOMY Left 05/06/2020   Procedure: KNEE ARTHROSCOPY WITH MEDIAL MENISCECTOMY AND LATERAL MENISCECTOMY;  Surgeon: [name], MD;  [facility];  Service: Orthopedics;  Laterality: Left;   Lower ext duplex doppler  03/14/14   Rt ABI 1.2   LOWER EXTREMITY ANGIOGRAM Bilateral 02/18/2014   Procedure: LOWER EXTREMITY ANGIOGRAM;  Surgeon: [name], MD;  Location: [facility] CATH LAB;  Service: Cardiovascular;  Laterality: Bilateral;   LOWER EXTREMITY ANGIOGRAM N/A 10/31/2014   Procedure: LOWER EXTREMITY ANGIOGRAM;  Surgeon: [name], MD;  Location: [facility] CATH LAB;  Service: Cardiovascular;  Laterality: N/A;   MALONEY DILATION N/A 10/11/2019   Procedure: Venia Minks DILATION;  Surgeon: [name], MD;  [facility];  Service: Endoscopy;  Laterality: N/A;   MULTIPLE TOOTH EXTRACTIONS     PV angiogram  02/18/2014   tibial vessel diseas bil.   SPINE SURGERY     tendon achillies lengthing and sesamoid      There were no vitals filed for this visit.   Subjective Assessment - 07/07/21 1452     Subjective pts reports compliance with HEP.  States the sidestepping is the hardest.  No pain or issues currently.    Currently in Pain? No/denies                               [facility] Adult PT Treatment/Exercise - 07/07/21 1501       Knee/Hip Exercises: Standing   Hip Flexion Both;3 sets;10 reps    Hip Flexion Limitations 5lb    Forward Lunges Both;3 sets;10 reps    Forward Lunges Limitations with 1 UE assist    Hip Abduction Both;3 sets;10 reps    Abduction Limitations 5# foot hits ground and pushes back    Hip Extension Both;3 sets;10 reps    Extension Limitations 5# tap backs    Forward Step Up Both;3 sets;10 reps;Step Height: 4"    Forward Step Up Limitations bilaterally with UE assist    Other Standing Knee Exercises side stepping - one hand support no resistance band cues to stand tall and straighten knee x5 B; backwards walking 5RT in // bars with  cues to straighten and walk with big steps.      Knee/Hip Exercises: Seated   Sit to Sand 3 sets;10 reps;without UE support                       PT Short Term Goals - 07/01/21 1022       PT SHORT TERM GOAL #1   Title Patient will be independent in self management strategies to improve quality of life and functional outcomes.    Baseline Reports compliance and going to the [facility]  Time 3    Period Weeks    Status Achieved    Target Date 04/27/21      PT SHORT TERM GOAL #2   Title Patient will be able to report at least 25% improvement in overall mobility.    Time 3    Period Weeks    Status Achieved    Target Date 04/27/21      PT SHORT TERM GOAL #3   Title Demo improved balance and BLE strength as evidenced by time of 18 sec 5xSTS test    Baseline 20.71 seconds - light headed 10/12 13 seconds    Time 3    Period Weeks    Status Achieved    Target Date 04/27/21               PT Long Term Goals - 07/01/21 1023       PT LONG TERM GOAL #1   Title Patient will improve FOTO score by at least 10 points in order to indicate improved tolerance to activity.    Baseline 2% increase    Time 6    Period Weeks    Status On-going      PT LONG TERM GOAL #2   Title Demo improved gait velocity and safety as evidenced by distance of 150 ft during 2MWT    Baseline 60 ft with rollator 10/12 250 feet with rollator    Time 6    Period Weeks    Status Achieved      PT LONG TERM GOAL #3   Title Patient will not have foot wound present to reduce risk of infection.    Time 4    Period Weeks    Status Achieved      PT LONG TERM GOAL #4   Title Patient will verbalize proper foot care techniques.    Time 4    Period Weeks    Status Achieved      PT LONG TERM GOAL #5   Title Patient will be able to ambulate at least 350 feet in 2MWT in order to demonstrate improving endurance and activity tolerance.    Time 4    Period Weeks    Status New    Target Date  07/29/21                   Plan - 07/07/21 1534     Clinical Impression Statement Continued with LE strengthening exercises.  Cues to complete therex more slowly and controlled.  Cues to push up higher and utilize calf mm more with heelraises.  Added 5# weights to high march holds this session. Added forward step ups and lunges to POC with minimal UE assist to work on stability and strength.  Pt only required 1 seated rest break during session today.  Cues to keep feet from scissoring and posture cues otherwise appears to be getting stronger.    Personal Factors and Comorbidities Comorbidity 1;Comorbidity 3+;Comorbidity 2    Comorbidities CHF, left knee surgery, right chronic foot wound, DB, restless leg syndromes    Examination-Activity Limitations Bathing;Lift;Locomotion Level;Transfers;Stand;Stairs;Squat;Reach Overhead;Carry    Examination-Participation Restrictions Community Activity;Meal Prep    Stability/Clinical Decision Making Evolving/Moderate complexity    Rehab Potential Fair    PT Frequency 2x / week    PT Duration 4 weeks    PT Treatment/Interventions ADLs/Self Care Home Management;Other (comment);Moist Heat;Balance training;Therapeutic exercise;Therapeutic activities;Functional mobility training;Stair training;Gait training;DME Instruction;Ultrasound;Neuromuscular re-education;Patient/family education;Orthotic Fit/Training;Manual techniques;Taping;Passive range of motion;Electrical Stimulation  PT Next Visit Plan focus on gait mechanics, side stepping, balance; glute strength    PT Home Exercise Plan hip abd/add isometric, QS, hamstring stretch;  04/15/21: Isometric abd, abd supine, SAQ, bridge 9/1 clamshell, sideyling hip abduciton 9/21 standing hip abduction/ extenison, knee flexion, heel raise/ toe raise    Consulted and Agree with Plan of Care Patient             Patient will benefit from skilled therapeutic intervention in order to improve the following deficits  and impairments:  Abnormal gait, Decreased endurance, Decreased skin integrity, Increased edema, Decreased activity tolerance, Decreased balance, Decreased mobility, Decreased knowledge of use of DME, Decreased strength, Difficulty walking, Pain, Decreased range of motion, Impaired flexibility, Postural dysfunction, Improper body mechanics  Visit Diagnosis: Difficulty in walking, not elsewhere classified  Muscle weakness (generalized)  Unsteadiness on feet     Problem List Patient Active Problem List   Diagnosis Date Noted   Hypokalemia 05/18/2021   Muscular deconditioning 05/12/2021   Frequent falls 05/12/2021   Fatigue 04/29/2021   Recent unexplained weight loss 04/23/2021   Left shoulder pain 04/23/2021   Chest tightness 04/23/2021   Deep tissue injury 04/23/2021   Myoclonus 04/15/2021   Pneumonia 04/03/2021   PNA (pneumonia) 04/02/2021   Abnormal finding on urinalysis 04/01/2021   Syncope 02/16/2021   Minor head injury    Right hip pain 02/11/2021   Erectile dysfunction 01/22/2021   Gait abnormality 09/30/2020   Mild non proliferative diabetic retinopathy ([place]) 07/16/2020   Need for immunization against influenza 06/03/2020   S/P left knee arthroscopy 05/06/20 05/13/2020   Abnormal MRI 03/26/2020   Joint disorder of knee 03/26/2020   Fall at home, initial encounter 03/13/2020   Anemia 09/03/2019   Reflux esophagitis 09/03/2019   Esophageal dysphagia 05/30/2019   Constipation 05/30/2019   Positive colorectal cancer screening using Cologuard test 05/30/2019   Diabetic foot ulcer ([place]) 09/06/2018   Orthostasis 08/25/2016   Congestive heart failure ([place]) 05/17/2016   Bilateral carotid artery disease ([place]) 07/08/2014   Obstructive sleep apnea 02/12/2014   Restless legs 10/23/2013   Overweight with body mass index (BMI) of 29 to 29.9 in adult 10/23/2013   Hyperlipidemia 07/16/2013   Peripheral arterial disease ([place]) 07/16/2013   Hypertension 03/14/2012   Type 2  diabetes mellitus with circulatory disorder ([place]) 03/14/2012   Peripheral neuropathy 03/14/2012   Cardiomyopathy- EF NL 01/2013, now 30-35% echo 03/14/2012   Teena Irani, PTA/CLT, WTA [phone]  Teena Irani, PTA 07/07/2021, 3:36 PM  [facility] [address] [place], [place], [address] Phone: [phone]   Fax:  [phone]  Name: EIRIK SCHUELER MRN: [id number] Date of Birth: 12/03/1945

## 2021-07-07 NOTE — Therapy (Signed)
[facility] [address] [place], [place], [address] Phone: [phone]   Fax:  [phone]  Occupational Therapy Treatment  Patient Details  Name: [name] MRN: [id number] Date of Birth: Oct 22, 1945 Referring Provider (OT): Demetrius Revel, NP   Encounter Date: 07/07/2021   OT End of Session - 07/07/21 1611     Visit Number 14    Number of Visits 16    Date for OT Re-Evaluation 07/13/21    Authorization Type [employer]; $30 copay    Authorization Time Period no visit limit    Progress Note Due on Visit 20    OT Start Time 1348    OT Stop Time 1430    OT Time Calculation (min) 42 min    Activity Tolerance Patient tolerated treatment well    Behavior During Therapy [facility] for tasks assessed/performed             Past Medical History:  Diagnosis Date   Aftercare following surgery of the circulatory system, NEC 12/04/2013   Angina decubitus ([place]) 05/19/2016   Arrhythmia 05/20/2016   Arthritis    ARTHRITIS, RIGHT FOOT 06/26/2008   Qualifier: Diagnosis of  By: Aline Brochure MD, [name]     Cardiomyopathy- EF NL 01/2013, now 30-35% echo 03/14/2012   Carotid artery occlusion    left s/p CEA   Cellulitis of left foot 05/07/2018   CHF (congestive heart failure) ([place])    Critical lower limb ischemia ([place]) 02/28/2014   Critical limb ischemia    Diabetes mellitus    Elevated troponin 04/11/2016   GERD (gastroesophageal reflux disease)    History of stroke June 2013 03/14/2012   HOH (hard of hearing)    Hypertension    Hypothyroidism    Nonhealing skin ulcer ([place]) 10/28/2014   Obstructive sleep apnea    Orthostatic hypotension    Osteomyelitis ([place])    left great toe   Pain in the chest 05/17/2016   Peripheral arterial disease ([place]), s/p PTA x 2 RLE    nonhealing ulcers bilaterally on each great toe   Pneumonia    PONV (postoperative nausea and vomiting)    Pulmonary nodule 05/17/2016   Restless leg syndrome    Shortness of  breath    Stroke [facility]) March 08, 2012   Toe osteomyelitis, left [facility])    Ulcer of great toe, left, with necrosis of bone ([place])    Wears dentures     Past Surgical History:  Procedure Laterality Date   AMPUTATION TOE Left 07/25/2018   Procedure: AMPUTATION TOE INTERPHALANGEAL HALLUX LEFT;  Surgeon: Evelina Bucy, DPM;  Location: [place];  Service: Podiatry;  Laterality: Left;   ANGIOPLASTY  02/28/14   diamond back orbital rotational atherectomy of Rt. tibial   BACK SURGERY     BONE BIOPSY Left 07/25/2018   Procedure: SUPERFICIAL BONE BIOPSY;  Surgeon: Evelina Bucy, DPM;  Location: [place];  Service: Podiatry;  Laterality: Left;   CARDIAC CATHETERIZATION N/A 05/19/2016   Procedure: Right/Left Heart Cath and Coronary Angiography;  Surgeon: Belva Crome, MD;  Location: [place] [facility];  Service: Cardiovascular;  Laterality: N/A;   CERVICAL FUSION     ENDARTERECTOMY Left 11/29/2013   Procedure: ENDARTERECTOMY CAROTID;  Surgeon: Serafina Mitchell, MD;  Location: [place];  Service: Vascular;  Laterality: Left;   ESOPHAGOGASTRODUODENOSCOPY  02/2010   Dr. Gala Romney: patient presented with food impaction, schatzki ring with superimposed component of stricture with erosive reflux esophagitis, s/p disimpaction  but dilation planned at later date    ESOPHAGOGASTRODUODENOSCOPY (EGD) WITH PROPOFOL N/A 08/13/2019   Dr. Gala Romney: Erosive reflux esophagitis with mild stricture and incidental Mallory-Weiss tear which precluded esophageal dilation.  Medium sized hiatal hernia.   ESOPHAGOGASTRODUODENOSCOPY (EGD) WITH PROPOFOL N/A 10/11/2019   Dr. Gala Romney: Esophageal stenosis status post dilation, moderate hiatal hernia   EYE SURGERY     FLEXIBLE SIGMOIDOSCOPY N/A 08/13/2019   Procedure: FLEXIBLE SIGMOIDOSCOPY;  Surgeon: [name], MD;  [facility];  Service: Endoscopy;  Laterality: N/A;  colonoscopy aboerted due to formed stool and poor prep   FOOT SURGERY     KNEE ARTHROSCOPY WITH MEDIAL MENISECTOMY  Left 05/06/2020   Procedure: KNEE ARTHROSCOPY WITH MEDIAL MENISCECTOMY AND LATERAL MENISCECTOMY;  Surgeon: [name], MD;  [facility];  Service: Orthopedics;  Laterality: Left;   Lower ext duplex doppler  03/14/14   Rt ABI 1.2   LOWER EXTREMITY ANGIOGRAM Bilateral 02/18/2014   Procedure: LOWER EXTREMITY ANGIOGRAM;  Surgeon: [name], MD;  Location: [facility] C[facility] LAB;  Service: Cardiovascular;  Laterality: Bilateral;   LOWER EXTREMITY ANGIOGRAM N/A 10/31/2014   Procedure: LOWER EXTREMITY ANGIOGRAM;  Surgeon: [name], MD;  Location: [facility] CATH LAB;  Service: Cardiovascular;  Laterality: N/A;   MALONEY DILATION N/A 10/11/2019   Procedure: Venia Minks DILATION;  Surgeon: [name], MD;  [facility];  Service: Endoscopy;  Laterality: N/A;   MULTIPLE TOOTH EXTRACTIONS     PV angiogram  02/18/2014   tibial vessel diseas bil.   SPINE SURGERY     tendon achillies lengthing and sesamoid      There were no vitals filed for this visit.   Subjective Assessment - 07/07/21 1347     Subjective  S: No pain today.    Currently in Pain? No/denies                [facility] OT Assessment - 07/07/21 0001       Assessment   Medical Diagnosis Chronic left shoulder pain      Precautions   Precautions Fall                      OT Treatments/Exercises (OP) - 07/07/21 0001       Exercises   Exercises Shoulder      Shoulder Exercises: Supine   Other Supine Exercises Dynamic progressive stretching; shoulder external rotation combined with abduction      Shoulder Exercises: ROM/Strengthening   Over Head Lace Seated. Patient laced chain starting from top to bottom then removed chain. Pt did not need rest breaks this date for lacing down chain. Rest breaks needed when removing string from chain. Pt engaged with verbal and tactile  cuing to not compensate with R lateral lean.      Functional Reaching Activities   High Level High level reaching using Squigz  while standing. Placed and removed standing. Break taken between placing squigz and removing.      Manual Therapy   Manual Therapy Soft tissue mobilization    Manual therapy comments Manual therapy completed prior to exercises    Soft tissue mobilization Provided soft tissue mobilization during dynamic passive shoulder stretching. Combined abduction and external rotation focusing on achieving his left hand behind his head.   able to touch top of head                     OT Short Term Goals -  06/26/21 1022       OT SHORT TERM GOAL #1   Title Pt will be provided with and educated on HEP to improve mobility of LUE required during ADL completion.    Time 4    Period Weeks    Target Date 06/26/21      OT SHORT TERM GOAL #2   Title Pt will decrease pain in LUE to 3/10 or less to improve ability to sleep for 3+ hours without waking due to pain.    Baseline 10/3: Pt reports that his is able to sleep 3-4 hours before the pain will wake him up if he sleeps on his left arm.    Time 4    Period Weeks      OT SHORT TERM GOAL #3   Title Pt will decrease LUE fascial restrictions to min amounts or less to improve ability to complete functional reaching tasks.    Baseline 9/9: Pt reports continued deficits in functional reaching.    Time 4    Period Weeks      OT SHORT TERM GOAL #4   Title Pt will increase LUE A/ROM to [facility] to improve ability to reach for clothes or items in closet.    Time 4    Period Weeks    Status On-going      OT SHORT TERM GOAL #5   Title Pt will increase LUE strength to 4/5 or greater to improve ability to lift cast iron skillet during meal prep tasks.    Baseline 10/3: Patient demonstrates a 3+/5 for external rotation and 5/5 for remainder of shoulder ranges.    Time 4    Period Weeks    Status Partially Met                      Plan - 07/07/21 1614     Clinical Impression Statement A: Continued dynamic progressive stretching in supine  with moist heat on L shoulder. Pt was able to touch the top anterior portion from head by the end of progressive stretching. Pt was able to complete overhead lacing with good endurance to lace in the chain with more rest breaks and fatigue with removing rope from chain. Pt did fully complete the task with tactie and verbal cuing to not compensate with R lateral lean. Pt also completed high level reaching with squigz while standing with good endurance. Pt only rested between task of putting squigz up and taking them down.    Body Structure / Function / Physical Skills ADL;Endurance;UE functional use;Fascial restriction;Pain;ROM;IADL;Strength    Plan P: Reassess pt; possibly discharge depending on progress.    OT Home Exercise Plan 8/16: A/ROM 10/3: Shoulder stretches: doorway and flexion    Consulted and Agree with Plan of Care Patient             Patient will benefit from skilled therapeutic intervention in order to improve the following deficits and impairments:   Body Structure / Function / Physical Skills: ADL, Endurance, UE functional use, Fascial restriction, Pain, ROM, IADL, Strength       Visit Diagnosis: Chronic left shoulder pain  Other symptoms and signs involving the musculoskeletal system  Stiffness of left shoulder, not elsewhere classified    Problem List Patient Active Problem List   Diagnosis Date Noted   Hypokalemia 05/18/2021   Muscular deconditioning 05/12/2021   Frequent falls 05/12/2021   Fatigue 04/29/2021   Recent unexplained weight loss 04/23/2021   Left shoulder pain  04/23/2021   Chest tightness 04/23/2021   Deep tissue injury 04/23/2021   Myoclonus 04/15/2021   Pneumonia 04/03/2021   PNA (pneumonia) 04/02/2021   Abnormal finding on urinalysis 04/01/2021   Syncope 02/16/2021   Minor head injury    Right hip pain 02/11/2021   Erectile dysfunction 01/22/2021   Gait abnormality 09/30/2020   Mild non proliferative diabetic retinopathy ([place])  07/16/2020   Need for immunization against influenza 06/03/2020   S/P left knee arthroscopy 05/06/20 05/13/2020   Abnormal MRI 03/26/2020   Joint disorder of knee 03/26/2020   Fall at home, initial encounter 03/13/2020   Anemia 09/03/2019   Reflux esophagitis 09/03/2019   Esophageal dysphagia 05/30/2019   Constipation 05/30/2019   Positive colorectal cancer screening using Cologuard test 05/30/2019   Diabetic foot ulcer ([place]) 09/06/2018   Orthostasis 08/25/2016   Congestive heart failure ([place]) 05/17/2016   Bilateral carotid artery disease ([place]) 07/08/2014   Obstructive sleep apnea 02/12/2014   Restless legs 10/23/2013   Overweight with body mass index (BMI) of 29 to 29.9 in adult 10/23/2013   Hyperlipidemia 07/16/2013   Peripheral arterial disease ([place]) 07/16/2013   Hypertension 03/14/2012   Type 2 diabetes mellitus with circulatory disorder ([place]) 03/14/2012   Peripheral neuropathy 03/14/2012   Cardiomyopathy- EF NL 01/2013, now 30-35% echo 03/14/2012   Larey Seat OT, MOT  Larey Seat, OT/L 07/07/2021, 4:19 PM  [place], [place], [address] Phone: [phone]   Fax:  [phone]  Name: [name] MRN: [id number] Date of Birth: [birth year]

## 2021-07-09 ENCOUNTER — Other Ambulatory Visit: Payer: Self-pay

## 2021-07-09 ENCOUNTER — Encounter (HOSPITAL_COMMUNITY): Payer: Self-pay

## 2021-07-09 ENCOUNTER — Ambulatory Visit (HOSPITAL_COMMUNITY): Payer: HMO

## 2021-07-09 DIAGNOSIS — M25612 Stiffness of left shoulder, not elsewhere classified: Secondary | ICD-10-CM

## 2021-07-09 DIAGNOSIS — R29898 Other symptoms and signs involving the musculoskeletal system: Secondary | ICD-10-CM

## 2021-07-09 DIAGNOSIS — M25512 Pain in left shoulder: Secondary | ICD-10-CM

## 2021-07-09 DIAGNOSIS — M6281 Muscle weakness (generalized): Secondary | ICD-10-CM | POA: Diagnosis not present

## 2021-07-09 DIAGNOSIS — G8929 Other chronic pain: Secondary | ICD-10-CM

## 2021-07-09 NOTE — Therapy (Signed)
[place], [place], [address] Phone: [phone]   Fax:  [phone]  Occupational Therapy Treatment Reassessment/ discharge summary Patient Details  Name: [name] MRN: [id number] Date of Birth: 08-26-1946 Referring Provider (OT): Demetrius Revel, NP   Encounter Date: 07/09/2021   OT End of Session - 07/09/21 2337     Visit Number 15    Number of Visits 16    Authorization Type [employer]; $30 copay    Authorization Time Period no visit limit    Progress Note Due on Visit 20    OT Start Time 1350   reassessment and discharge   OT Stop Time 1428    OT Time Calculation (min) 38 min    Activity Tolerance Patient tolerated treatment well    Behavior During Therapy [facility] for tasks assessed/performed             Past Medical History:  Diagnosis Date   Aftercare following surgery of the circulatory system, NEC 12/04/2013   Angina decubitus ([place]) 05/19/2016   Arrhythmia 05/20/2016   Arthritis    ARTHRITIS, RIGHT FOOT 06/26/2008   Qualifier: Diagnosis of  By: Aline Brochure MD, [name]     Cardiomyopathy- EF NL 01/2013, now 30-35% echo 03/14/2012   Carotid artery occlusion    left s/p CEA   Cellulitis of left foot 05/07/2018   CHF (congestive heart failure) ([place])    Critical lower limb ischemia ([place]) 02/28/2014   Critical limb ischemia    Diabetes mellitus    Elevated troponin 04/11/2016   GERD (gastroesophageal reflux disease)    History of stroke June 2013 03/14/2012   HOH (hard of hearing)    Hypertension    Hypothyroidism    Nonhealing skin ulcer ([place]) 10/28/2014   Obstructive sleep apnea    Orthostatic hypotension    Osteomyelitis ([place])    left great toe   Pain in the chest 05/17/2016   Peripheral arterial disease ([place]), s/p PTA x 2 RLE    nonhealing ulcers bilaterally on each great toe   Pneumonia    PONV (postoperative nausea and vomiting)    Pulmonary nodule 05/17/2016   Restless leg syndrome     Shortness of breath    Stroke [facility]) March 08, 2012   Toe osteomyelitis, left [facility])    Ulcer of great toe, left, with necrosis of bone ([place])    Wears dentures     Past Surgical History:  Procedure Laterality Date   AMPUTATION TOE Left 07/25/2018   Procedure: AMPUTATION TOE INTERPHALANGEAL HALLUX LEFT;  Surgeon: Evelina Bucy, DPM;  Location: [place];  Service: Podiatry;  Laterality: Left;   ANGIOPLASTY  02/28/14   diamond back orbital rotational atherectomy of Rt. tibial   BACK SURGERY     BONE BIOPSY Left 07/25/2018   Procedure: SUPERFICIAL BONE BIOPSY;  Surgeon: Evelina Bucy, DPM;  Location: [place];  Service: Podiatry;  Laterality: Left;   CARDIAC CATHETERIZATION N/A 05/19/2016   Procedure: Right/Left Heart Cath and Coronary Angiography;  Surgeon: Belva Crome, MD;  Location: [place] CV LAB;  Service: Cardiovascular;  Laterality: N/A;   CERVICAL FUSION     ENDARTERECTOMY Left 11/29/2013   Procedure: ENDARTERECTOMY CAROTID;  Surgeon: Serafina Mitchell, MD;  Location: [place];  Service: Vascular;  Laterality: Left;   ESOPHAGOGASTRODUODENOSCOPY  02/2010   Dr. Gala Romney: patient presented with food impaction, schatzki ring with superimposed component of stricture with erosive reflux esophagitis, s/p disimpaction but dilation  planned at later date    ESOPHAGOGASTRODUODENOSCOPY (EGD) WITH PROPOFOL N/A 08/13/2019   Dr. Gala Romney: Erosive reflux esophagitis with mild stricture and incidental Mallory-Weiss tear which precluded esophageal dilation.  Medium sized hiatal hernia.   ESOPHAGOGASTRODUODENOSCOPY (EGD) WITH PROPOFOL N/A 10/11/2019   Dr. Gala Romney: Esophageal stenosis status post dilation, moderate hiatal hernia   EYE SURGERY     FLEXIBLE SIGMOIDOSCOPY N/A 08/13/2019   Procedure: FLEXIBLE SIGMOIDOSCOPY;  Surgeon: [name], MD;  [facility];  Service: Endoscopy;  Laterality: N/A;  colonoscopy aboerted due to formed stool and poor prep   FOOT SURGERY     KNEE ARTHROSCOPY WITH  MEDIAL MENISECTOMY Left 05/06/2020   Procedure: KNEE ARTHROSCOPY WITH MEDIAL MENISCECTOMY AND LATERAL MENISCECTOMY;  Surgeon: [name], MD;  [facility];  Service: Orthopedics;  Laterality: Left;   Lower ext duplex doppler  03/14/14   Rt ABI 1.2   LOWER EXTREMITY ANGIOGRAM Bilateral 02/18/2014   Procedure: LOWER EXTREMITY ANGIOGRAM;  Surgeon: [name], MD;  Location: [facility] CATH LAB;  Service: Cardiovascular;  Laterality: Bilateral;   LOWER EXTREMITY ANGIOGRAM N/A 10/31/2014   Procedure: LOWER EXTREMITY ANGIOGRAM;  Surgeon: [name], MD;  Location: [facility] CATH LAB;  Service: Cardiovascular;  Laterality: N/A;   MALONEY DILATION N/A 10/11/2019   Procedure: Venia Minks DILATION;  Surgeon: [name], MD;  [facility];  Service: Endoscopy;  Laterality: N/A;   MULTIPLE TOOTH EXTRACTIONS     PV angiogram  02/18/2014   tibial vessel diseas bil.   SPINE SURGERY     tendon achillies lengthing and sesamoid      There were no vitals filed for this visit.   Subjective Assessment - 07/09/21 1359     Subjective  S: It's feeling better.    Currently in Pain? No/denies                [facility] OT Assessment - 07/09/21 1359       Assessment   Medical Diagnosis Chronic left shoulder pain      Precautions   Precautions Fall      ROM / Strength   AROM / PROM / Strength AROM;Strength      AROM   Overall AROM Comments Assessed standing, er/IR adducted    AROM Assessment Site Shoulder    Right/Left Shoulder Left    Left Shoulder Flexion 120 Degrees   previous: 112   Left Shoulder ABduction 110 Degrees   previous: 110   Left Shoulder External Rotation 24 Degrees   previous: 24     Strength   Overall Strength Comments Assessed seated, er/IR adducted    Strength Assessment Site Shoulder    Right/Left Shoulder Left    Left Shoulder Flexion 5/5   previous: same   Left Shoulder ABduction 5/5   previous: 4+/5   Left Shoulder Internal Rotation 5/5   previous: same    Left Shoulder External Rotation 4/5   previous: 3+/5                     OT Treatments/Exercises (OP) - 07/09/21 0001       Exercises   Exercises Shoulder      Shoulder Exercises: Supine   Other Supine Exercises Dynamic progressive stretching; shoulder external rotation combined with abduction      Shoulder Exercises: ROM/Strengthening   UBE (Upper Arm Bike) Level 5 3' forward 3' reverse pace: 8.0-9.0  OT Short Term Goals - 07/09/21 2339       OT SHORT TERM GOAL #1   Title Pt will be provided with and educated on HEP to improve mobility of LUE required during ADL completion.    Time 4    Period Weeks    Target Date 06/26/21      OT SHORT TERM GOAL #2   Title Pt will decrease pain in LUE to 3/10 or less to improve ability to sleep for 3+ hours without waking due to pain.    Time 4    Period Weeks      OT SHORT TERM GOAL #3   Title Pt will decrease LUE fascial restrictions to min amounts or less to improve ability to complete functional reaching tasks.    Time 4    Period Weeks      OT SHORT TERM GOAL #4   Title Pt will increase LUE A/ROM to [facility] to improve ability to reach for clothes or items in closet.    Time 4    Period Weeks    Status Achieved      OT SHORT TERM GOAL #5   Title Pt will increase LUE strength to 4/5 or greater to improve ability to lift cast iron skillet during meal prep tasks.    Time 4    Period Weeks    Status Achieved                      Plan - 07/09/21 2340     Clinical Impression Statement A: reassessment completed this date with shoulder measurements and strength assessed. Pt is able to demonstrate increased A/ROM for shoulder flexion that is functional. Overall shoulder strength has also improved. All therapy goals have been met. Pt is now attending the [facility] where he is able to continue working on improving his overall strength and shoulder stability.    Body Structure /  Function / Physical Skills ADL;Endurance;UE functional use;Fascial restriction;Pain;ROM;IADL;Strength    Plan P: D/C from OT with [facility].    Consulted and Agree with Plan of Care Patient             Patient will benefit from skilled therapeutic intervention in order to improve the following deficits and impairments:   Body Structure / Function / Physical Skills: ADL, Endurance, UE functional use, Fascial restriction, Pain, ROM, IADL, Strength       Visit Diagnosis: Chronic left shoulder pain  Other symptoms and signs involving the musculoskeletal system  Stiffness of left shoulder, not elsewhere classified    Problem List Patient Active Problem List   Diagnosis Date Noted   Hypokalemia 05/18/2021   Muscular deconditioning 05/12/2021   Frequent falls 05/12/2021   Fatigue 04/29/2021   Recent unexplained weight loss 04/23/2021   Left shoulder pain 04/23/2021   Chest tightness 04/23/2021   Deep tissue injury 04/23/2021   Myoclonus 04/15/2021   Pneumonia 04/03/2021   PNA (pneumonia) 04/02/2021   Abnormal finding on urinalysis 04/01/2021   Syncope 02/16/2021   Minor head injury    Right hip pain 02/11/2021   Erectile dysfunction 01/22/2021   Gait abnormality 09/30/2020   Mild non proliferative diabetic retinopathy ([place]) 07/16/2020   Need for immunization against influenza 06/03/2020   S/P left knee arthroscopy 05/06/20 05/13/2020   Abnormal MRI 03/26/2020   Joint disorder of knee 03/26/2020   Fall at home, initial encounter 03/13/2020   Anemia 09/03/2019   Reflux esophagitis 09/03/2019   Esophageal dysphagia  05/30/2019   Constipation 05/30/2019   Positive colorectal cancer screening using Cologuard test 05/30/2019   Diabetic foot ulcer ([place]) 09/06/2018   Orthostasis 08/25/2016   Congestive heart failure ([place]) 05/17/2016   Bilateral carotid artery disease ([place]) 07/08/2014   Obstructive sleep apnea 02/12/2014   Restless legs 10/23/2013   Overweight with body  mass index (BMI) of 29 to 29.9 in adult 10/23/2013   Hyperlipidemia 07/16/2013   Peripheral arterial disease ([place]) 07/16/2013   Hypertension 03/14/2012   Type 2 diabetes mellitus with circulatory disorder ([place]) 03/14/2012   Peripheral neuropathy 03/14/2012   Cardiomyopathy- EF NL 01/2013, now 30-35% echo 03/14/2012  OCCUPATIONAL THERAPY DISCHARGE SUMMARY  Visits from Start of Care: 15  Current functional level related to goals / functional outcomes: See above   Remaining deficits: Continues to have difficulty reaching the back of his head with the left hand to shave although it has improved.    Education / Equipment: Shoulder stretches, shoulder ROM exercises   Patient agrees to discharge. Patient goals were met. Patient is being discharged due to meeting the stated rehab goals.Ailene Ravel, OTR/L,CBIS  [phone]  07/09/2021, 11:44 PM  [place] [address] [place], [place], [address] Phone: [phone]   Fax:  [phone]  Name: [name] MRN: [id number] Date of Birth: [birth year]

## 2021-07-10 ENCOUNTER — Ambulatory Visit (HOSPITAL_COMMUNITY): Payer: HMO | Admitting: Physical Therapy

## 2021-07-14 ENCOUNTER — Other Ambulatory Visit: Payer: Self-pay

## 2021-07-14 ENCOUNTER — Encounter (HOSPITAL_COMMUNITY): Payer: HMO | Admitting: Occupational Therapy

## 2021-07-14 ENCOUNTER — Ambulatory Visit (HOSPITAL_COMMUNITY): Payer: HMO | Admitting: Physical Therapy

## 2021-07-14 DIAGNOSIS — R2681 Unsteadiness on feet: Secondary | ICD-10-CM

## 2021-07-14 DIAGNOSIS — M6281 Muscle weakness (generalized): Secondary | ICD-10-CM | POA: Diagnosis not present

## 2021-07-14 DIAGNOSIS — G8929 Other chronic pain: Secondary | ICD-10-CM

## 2021-07-14 DIAGNOSIS — R262 Difficulty in walking, not elsewhere classified: Secondary | ICD-10-CM

## 2021-07-14 NOTE — Therapy (Signed)
[facility] [address] [place], [place], [address] Phone: [phone]   Fax:  [phone]  Physical Therapy Treatment  Patient Details  Name: [name] MRN: [id number] Date of Birth: Sep 16, 1946 Referring Provider (PT): Noreene Larsson, NP   Encounter Date: 07/14/2021   PT End of Session - 07/14/21 1046     Visit Number 21    Number of Visits 34    Date for PT Re-Evaluation 07/29/21    Authorization Type [employer], no VL, no auth ,    Progress Note Due on Visit 38    PT Start Time 1004    PT Stop Time 3557    PT Time Calculation (min) 43 min    Equipment Utilized During Treatment Gait belt    Activity Tolerance Patient tolerated treatment well    Behavior During Therapy WFL for tasks assessed/performed             Past Medical History:  Diagnosis Date   Aftercare following surgery of the circulatory system, NEC 12/04/2013   Angina decubitus ([place]) 05/19/2016   Arrhythmia 05/20/2016   Arthritis    ARTHRITIS, RIGHT FOOT 06/26/2008   Qualifier: Diagnosis of  By: Aline Brochure MD, [name]     Cardiomyopathy- EF NL 01/2013, now 30-35% echo 03/14/2012   Carotid artery occlusion    left s/p CEA   Cellulitis of left foot 05/07/2018   CHF (congestive heart failure) ([place])    Critical lower limb ischemia ([place]) 02/28/2014   Critical limb ischemia    Diabetes mellitus    Elevated troponin 04/11/2016   GERD (gastroesophageal reflux disease)    History of stroke June 2013 03/14/2012   HOH (hard of hearing)    Hypertension    Hypothyroidism    Nonhealing skin ulcer ([place]) 10/28/2014   Obstructive sleep apnea    Orthostatic hypotension    Osteomyelitis ([place])    left great toe   Pain in the chest 05/17/2016   Peripheral arterial disease ([place]), s/p PTA x 2 RLE    nonhealing ulcers bilaterally on each great toe   Pneumonia    PONV (postoperative nausea and vomiting)    Pulmonary nodule 05/17/2016   Restless leg syndrome     Shortness of breath    Stroke [facility]) March 08, 2012   Toe osteomyelitis, left [facility])    Ulcer of great toe, left, with necrosis of bone ([place])    Wears dentures     Past Surgical History:  Procedure Laterality Date   AMPUTATION TOE Left 07/25/2018   Procedure: AMPUTATION TOE INTERPHALANGEAL HALLUX LEFT;  Surgeon: Evelina Bucy, DPM;  Location: [facility];  Service: Podiatry;  Laterality: Left;   ANGIOPLASTY  02/28/14   diamond back orbital rotational atherectomy of Rt. tibial   BACK SURGERY     BONE BIOPSY Left 07/25/2018   Procedure: SUPERFICIAL BONE BIOPSY;  Surgeon: Evelina Bucy, DPM;  Location: [facility];  Service: Podiatry;  Laterality: Left;   CARDIAC CATHETERIZATION N/A 05/19/2016   Procedure: Right/Left Heart Cath and Coronary Angiography;  Surgeon: Belva Crome, MD;  Location: [facility] CV LAB;  Service: Cardiovascular;  Laterality: N/A;   CERVICAL FUSION     ENDARTERECTOMY Left 11/29/2013   Procedure: ENDARTERECTOMY CAROTID;  Surgeon: Serafina Mitchell, MD;  Location: [facility];  Service: Vascular;  Laterality: Left;   ESOPHAGOGASTRODUODENOSCOPY  02/2010   Dr. Gala Romney: patient presented with food impaction, schatzki ring with superimposed component of stricture with erosive  reflux esophagitis, s/p disimpaction but dilation planned at later date    ESOPHAGOGASTRODUODENOSCOPY (EGD) WITH PROPOFOL N/A 08/13/2019   Dr. Gala Romney: Erosive reflux esophagitis with mild stricture and incidental Mallory-Weiss tear which precluded esophageal dilation.  Medium sized hiatal hernia.   ESOPHAGOGASTRODUODENOSCOPY (EGD) WITH PROPOFOL N/A 10/11/2019   Dr. Gala Romney: Esophageal stenosis status post dilation, moderate hiatal hernia   EYE SURGERY     FLEXIBLE SIGMOIDOSCOPY N/A 08/13/2019   Procedure: FLEXIBLE SIGMOIDOSCOPY;  Surgeon: [name], MD;  [facility];  Service: Endoscopy;  Laterality: N/A;  colonoscopy aboerted due to formed stool and poor prep   FOOT SURGERY     KNEE ARTHROSCOPY WITH MEDIAL  MENISECTOMY Left 05/06/2020   Procedure: KNEE ARTHROSCOPY WITH MEDIAL MENISCECTOMY AND LATERAL MENISCECTOMY;  Surgeon: [name], MD;  [facility]: [facility];  Service: Orthopedics;  Laterality: Left;   Lower ext duplex doppler  03/14/14   Rt ABI 1.2   LOWER EXTREMITY ANGIOGRAM Bilateral 02/18/2014   Procedure: LOWER EXTREMITY ANGIOGRAM;  Surgeon: [name], MD;  Location: [facility] CATH LAB;  Service: Cardiovascular;  Laterality: Bilateral;   LOWER EXTREMITY ANGIOGRAM N/A 10/31/2014   Procedure: LOWER EXTREMITY ANGIOGRAM;  Surgeon: [name], MD;  Location: [facility] CATH LAB;  Service: Cardiovascular;  Laterality: N/A;   MALONEY DILATION N/A 10/11/2019   Procedure: Venia Minks DILATION;  Surgeon: [name], MD;  [facility];  Service: Endoscopy;  Laterality: N/A;   MULTIPLE TOOTH EXTRACTIONS     PV angiogram  02/18/2014   tibial vessel diseas bil.   SPINE SURGERY     tendon achillies lengthing and sesamoid      There were no vitals filed for this visit.   Subjective Assessment - 07/14/21 1009     Subjective Pt states he did not do any exercises over the weekend due to feeling sick on his stomach and no energy.  States he started feeling better on sunday.  Currently without pain.    Currently in Pain? No/denies                               [facility] Adult PT Treatment/Exercise - 07/14/21 0001       Knee/Hip Exercises: Standing   Heel Raises 20 reps    Heel Raises Limitations cues to push straight up    Hip Flexion Both;3 sets;10 reps    Hip Flexion Limitations 5lb    Forward Lunges Both;3 sets;10 reps    Forward Lunges Limitations with 1 UE assist    Hip Abduction Both;3 sets;10 reps    Abduction Limitations 5# foot hits ground and pushes back    Hip Extension Both;3 sets;10 reps    Extension Limitations 5# tap backs    Forward Step Up Both;3 sets;10 reps;Step Height: 4"    Forward Step Up Limitations bilaterally with UE assist    SLS with Vectors  each LE with 1 HHA 5X3" each    Other Standing Knee Exercises tandem stance 30" X2 each with max of 13"lt and 10" Rt      Knee/Hip Exercises: Seated   Sit to Sand 3 sets;10 reps;without UE support                       PT Short Term Goals - 07/01/21 1022       PT SHORT TERM GOAL #1   Title Patient will be independent in  self management strategies to improve quality of life and functional outcomes.    Baseline Reports compliance and going to the [facility]    Time 3    Period Weeks    Status Achieved    Target Date 04/27/21      PT SHORT TERM GOAL #2   Title Patient will be able to report at least 25% improvement in overall mobility.    Time 3    Period Weeks    Status Achieved    Target Date 04/27/21      PT SHORT TERM GOAL #3   Title Demo improved balance and BLE strength as evidenced by time of 18 sec 5xSTS test    Baseline 20.71 seconds - light headed 10/12 13 seconds    Time 3    Period Weeks    Status Achieved    Target Date 04/27/21               PT Long Term Goals - 07/01/21 1023       PT LONG TERM GOAL #1   Title Patient will improve FOTO score by at least 10 points in order to indicate improved tolerance to activity.    Baseline 2% increase    Time 6    Period Weeks    Status On-going      PT LONG TERM GOAL #2   Title Demo improved gait velocity and safety as evidenced by distance of 150 ft during 2MWT    Baseline 60 ft with rollator 10/12 250 feet with rollator    Time 6    Period Weeks    Status Achieved      PT LONG TERM GOAL #3   Title Patient will not have foot wound present to reduce risk of infection.    Time 4    Period Weeks    Status Achieved      PT LONG TERM GOAL #4   Title Patient will verbalize proper foot care techniques.    Time 4    Period Weeks    Status Achieved      PT LONG TERM GOAL #5   Title Patient will be able to ambulate at least 350 feet in 2MWT in order to demonstrate improving endurance and activity  tolerance.    Time 4    Period Weeks    Status New    Target Date 07/29/21                   Plan - 07/14/21 1045     Clinical Impression Statement Pt came in and had to use the restroom starting session a few minutes late. Pt overall better from the weekend, however noted increased fatigue as compared to last session.   Began static tandem balance challenge with increased time with Lt leading vs Rt as Lt, however more difficulty maintaining stability when Rt LE support LE with added vector stance.  Pt is pleased with his improvement in being able to stand with increased ease. Pt only required 2 seated rest breaks during session today.    Personal Factors and Comorbidities Comorbidity 1;Comorbidity 3+;Comorbidity 2    Comorbidities CHF, left knee surgery, right chronic foot wound, DB, restless leg syndromes    Examination-Activity Limitations Bathing;Lift;Locomotion Level;Transfers;Stand;Stairs;Squat;Reach Overhead;Carry    Examination-Participation Restrictions Community Activity;Meal Prep    Stability/Clinical Decision Making Evolving/Moderate complexity    Rehab Potential Fair    PT Frequency 2x / week    PT Duration 4 weeks  PT Treatment/Interventions ADLs/Self Care Home Management;Other (comment);Moist Heat;Balance training;Therapeutic exercise;Therapeutic activities;Functional mobility training;Stair training;Gait training;DME Instruction;Ultrasound;Neuromuscular re-education;Patient/family education;Orthotic Fit/Training;Manual techniques;Taping;Passive range of motion;Electrical Stimulation    PT Next Visit Plan focus on gait mechanics, glute strength and balance.    PT Home Exercise Plan hip abd/add isometric, QS, hamstring stretch;  04/15/21: Isometric abd, abd supine, SAQ, bridge 9/1 clamshell, sideyling hip abduciton 9/21 standing hip abduction/ extenison, knee flexion, heel raise/ toe raise    Consulted and Agree with Plan of Care Patient             Patient will  benefit from skilled therapeutic intervention in order to improve the following deficits and impairments:  Abnormal gait, Decreased endurance, Decreased skin integrity, Increased edema, Decreased activity tolerance, Decreased balance, Decreased mobility, Decreased knowledge of use of DME, Decreased strength, Difficulty walking, Pain, Decreased range of motion, Impaired flexibility, Postural dysfunction, Improper body mechanics  Visit Diagnosis: Chronic left shoulder pain  Difficulty in walking, not elsewhere classified  Unsteadiness on feet  Muscle weakness (generalized)     Problem List Patient Active Problem List   Diagnosis Date Noted   Hypokalemia 05/18/2021   Muscular deconditioning 05/12/2021   Frequent falls 05/12/2021   Fatigue 04/29/2021   Recent unexplained weight loss 04/23/2021   Left shoulder pain 04/23/2021   Chest tightness 04/23/2021   Deep tissue injury 04/23/2021   Myoclonus 04/15/2021   Pneumonia 04/03/2021   PNA (pneumonia) 04/02/2021   Abnormal finding on urinalysis 04/01/2021   Syncope 02/16/2021   Minor head injury    Right hip pain 02/11/2021   Erectile dysfunction 01/22/2021   Gait abnormality 09/30/2020   Mild non proliferative diabetic retinopathy ([place]) 07/16/2020   Need for immunization against influenza 06/03/2020   S/P left knee arthroscopy 05/06/20 05/13/2020   Abnormal MRI 03/26/2020   Joint disorder of knee 03/26/2020   Fall at home, initial encounter 03/13/2020   Anemia 09/03/2019   Reflux esophagitis 09/03/2019   Esophageal dysphagia 05/30/2019   Constipation 05/30/2019   Positive colorectal cancer screening using Cologuard test 05/30/2019   Diabetic foot ulcer ([place]) 09/06/2018   Orthostasis 08/25/2016   Congestive heart failure ([place]) 05/17/2016   Bilateral carotid artery disease ([place]) 07/08/2014   Obstructive sleep apnea 02/12/2014   Restless legs 10/23/2013   Overweight with body mass index (BMI) of 29 to 29.9 in adult 10/23/2013    Hyperlipidemia 07/16/2013   Peripheral arterial disease ([place]) 07/16/2013   Hypertension 03/14/2012   Type 2 diabetes mellitus with circulatory disorder ([place]) 03/14/2012   Peripheral neuropathy 03/14/2012   Cardiomyopathy- EF NL 01/2013, now 30-35% echo 03/14/2012   Teena Irani, PTA/CLT, WTA [phone]  Teena Irani, PTA 07/14/2021, 10:47 AM  [facility] [address] [place], [place], [address] Phone: [phone]   Fax:  [phone]  Name: [name] MRN: [id number] Date of Birth: 1945-10-15

## 2021-07-16 ENCOUNTER — Ambulatory Visit (HOSPITAL_COMMUNITY): Payer: HMO | Admitting: Physical Therapy

## 2021-07-16 ENCOUNTER — Encounter (HOSPITAL_COMMUNITY): Payer: Self-pay | Admitting: Physical Therapy

## 2021-07-16 ENCOUNTER — Other Ambulatory Visit: Payer: Self-pay

## 2021-07-16 DIAGNOSIS — R2681 Unsteadiness on feet: Secondary | ICD-10-CM

## 2021-07-16 DIAGNOSIS — M6281 Muscle weakness (generalized): Secondary | ICD-10-CM

## 2021-07-16 DIAGNOSIS — R262 Difficulty in walking, not elsewhere classified: Secondary | ICD-10-CM

## 2021-07-16 DIAGNOSIS — R29898 Other symptoms and signs involving the musculoskeletal system: Secondary | ICD-10-CM

## 2021-07-16 NOTE — Therapy (Signed)
[facility] [address] [place], [place], [address] Phone: [phone]   Fax:  [phone]  Physical Therapy Treatment  Patient Details  Name: [name] MRN: [id number] Date of Birth: 10-06-1945 Referring Provider (PT): Noreene Larsson, NP   Encounter Date: 07/16/2021   PT End of Session - 07/16/21 1047     Visit Number 22    Number of Visits 34    Date for PT Re-Evaluation 07/29/21    Authorization Type [employer], no VL, no auth ,    Progress Note Due on Visit 29    PT Start Time 1100   limited by restroom time   PT Stop Time 1127    PT Time Calculation (min) 27 min    Equipment Utilized During Treatment Gait belt    Activity Tolerance Patient tolerated treatment well    Behavior During Therapy [facility] for tasks assessed/performed             Past Medical History:  Diagnosis Date   Aftercare following surgery of the circulatory system, NEC 12/04/2013   Angina decubitus ([place]) 05/19/2016   Arrhythmia 05/20/2016   Arthritis    ARTHRITIS, RIGHT FOOT 06/26/2008   Qualifier: Diagnosis of  By: Aline Brochure MD, [name]     Cardiomyopathy- EF NL 01/2013, now 30-35% echo 03/14/2012   Carotid artery occlusion    left s/p CEA   Cellulitis of left foot 05/07/2018   CHF (congestive heart failure) ([place])    Critical lower limb ischemia ([place]) 02/28/2014   Critical limb ischemia    Diabetes mellitus    Elevated troponin 04/11/2016   GERD (gastroesophageal reflux disease)    History of stroke June 2013 03/14/2012   HOH (hard of hearing)    Hypertension    Hypothyroidism    Nonhealing skin ulcer ([place]) 10/28/2014   Obstructive sleep apnea    Orthostatic hypotension    Osteomyelitis ([place])    left great toe   Pain in the chest 05/17/2016   Peripheral arterial disease ([place]), s/p PTA x 2 RLE    nonhealing ulcers bilaterally on each great toe   Pneumonia    PONV (postoperative nausea and vomiting)    Pulmonary nodule 05/17/2016    Restless leg syndrome    Shortness of breath    Stroke [facility]) March 08, 2012   Toe osteomyelitis, left [facility])    Ulcer of great toe, left, with necrosis of bone ([place])    Wears dentures     Past Surgical History:  Procedure Laterality Date   AMPUTATION TOE Left 07/25/2018   Procedure: AMPUTATION TOE INTERPHALANGEAL HALLUX LEFT;  Surgeon: Evelina Bucy, DPM;  Location: [facility];  Service: Podiatry;  Laterality: Left;   ANGIOPLASTY  02/28/14   diamond back orbital rotational atherectomy of Rt. tibial   BACK SURGERY     BONE BIOPSY Left 07/25/2018   Procedure: SUPERFICIAL BONE BIOPSY;  Surgeon: Evelina Bucy, DPM;  Location: [facility];  Service: Podiatry;  Laterality: Left;   CARDIAC CATHETERIZATION N/A 05/19/2016   Procedure: Right/Left Heart Cath and Coronary Angiography;  Surgeon: Belva Crome, MD;  Location: [facility] CV LAB;  Service: Cardiovascular;  Laterality: N/A;   CERVICAL FUSION     ENDARTERECTOMY Left 11/29/2013   Procedure: ENDARTERECTOMY CAROTID;  Surgeon: Serafina Mitchell, MD;  Location: [facility];  Service: Vascular;  Laterality: Left;   ESOPHAGOGASTRODUODENOSCOPY  02/2010   Dr. Gala Romney: patient presented with food impaction, schatzki ring with superimposed  component of stricture with erosive reflux esophagitis, s/p disimpaction but dilation planned at later date    ESOPHAGOGASTRODUODENOSCOPY (EGD) WITH PROPOFOL N/A 08/13/2019   Dr. Gala Romney: Erosive reflux esophagitis with mild stricture and incidental Mallory-Weiss tear which precluded esophageal dilation.  Medium sized hiatal hernia.   ESOPHAGOGASTRODUODENOSCOPY (EGD) WITH PROPOFOL N/A 10/11/2019   Dr. Gala Romney: Esophageal stenosis status post dilation, moderate hiatal hernia   EYE SURGERY     FLEXIBLE SIGMOIDOSCOPY N/A 08/13/2019   Procedure: FLEXIBLE SIGMOIDOSCOPY;  Surgeon: [name], MD;  [facility];  Service: Endoscopy;  Laterality: N/A;  colonoscopy aboerted due to formed stool and poor prep   FOOT SURGERY      KNEE ARTHROSCOPY WITH MEDIAL MENISECTOMY Left 05/06/2020   Procedure: KNEE ARTHROSCOPY WITH MEDIAL MENISCECTOMY AND LATERAL MENISCECTOMY;  Surgeon: [name], MD;  [facility];  Service: Orthopedics;  Laterality: Left;   Lower ext duplex doppler  03/14/14   Rt ABI 1.2   LOWER EXTREMITY ANGIOGRAM Bilateral 02/18/2014   Procedure: LOWER EXTREMITY ANGIOGRAM;  Surgeon: [name], MD;  Location: [facility] CATH LAB;  Service: Cardiovascular;  Laterality: Bilateral;   LOWER EXTREMITY ANGIOGRAM N/A 10/31/2014   Procedure: LOWER EXTREMITY ANGIOGRAM;  Surgeon: [name], MD;  Location: [facility] CATH LAB;  Service: Cardiovascular;  Laterality: N/A;   MALONEY DILATION N/A 10/11/2019   Procedure: Venia Minks DILATION;  Surgeon: [name], MD;  [facility];  Service: Endoscopy;  Laterality: N/A;   MULTIPLE TOOTH EXTRACTIONS     PV angiogram  02/18/2014   tibial vessel diseas bil.   SPINE SURGERY     tendon achillies lengthing and sesamoid      There were no vitals filed for this visit.   Subjective Assessment - 07/16/21 1048     Subjective He feels weak after using the bathroom    Currently in Pain? No/denies                               [facility] Adult PT Treatment/Exercise - 07/16/21 0001       Knee/Hip Exercises: Standing   Hip Flexion Both;3 sets;10 reps    Hip Flexion Limitations 5lb    Hip Abduction Both;3 sets;10 reps    Abduction Limitations 5# foot hits ground and pushes back    Hip Extension Both;3 sets;10 reps    Extension Limitations 5# tap backs    Forward Step Up Both;3 sets;10 reps;Step Height: 4"    Forward Step Up Limitations bilaterally with UE assist                     PT Education - 07/16/21 1048     Education Details HEP    Person(s) Educated Patient    Methods Explanation;Demonstration    Comprehension Verbalized understanding;Returned demonstration              PT Short Term Goals - 07/01/21 1022        PT SHORT TERM GOAL #1   Title Patient will be independent in self management strategies to improve quality of life and functional outcomes.    Baseline Reports compliance and going to the [facility]    Time 3    Period Weeks    Status Achieved    Target Date 04/27/21      PT SHORT TERM GOAL #2   Title Patient will be able to report at least 25% improvement  in overall mobility.    Time 3    Period Weeks    Status Achieved    Target Date 04/27/21      PT SHORT TERM GOAL #3   Title Demo improved balance and BLE strength as evidenced by time of 18 sec 5xSTS test    Baseline 20.71 seconds - light headed 10/12 13 seconds    Time 3    Period Weeks    Status Achieved    Target Date 04/27/21               PT Long Term Goals - 07/01/21 1023       PT LONG TERM GOAL #1   Title Patient will improve FOTO score by at least 10 points in order to indicate improved tolerance to activity.    Baseline 2% increase    Time 6    Period Weeks    Status On-going      PT LONG TERM GOAL #2   Title Demo improved gait velocity and safety as evidenced by distance of 150 ft during 2MWT    Baseline 60 ft with rollator 10/12 250 feet with rollator    Time 6    Period Weeks    Status Achieved      PT LONG TERM GOAL #3   Title Patient will not have foot wound present to reduce risk of infection.    Time 4    Period Weeks    Status Achieved      PT LONG TERM GOAL #4   Title Patient will verbalize proper foot care techniques.    Time 4    Period Weeks    Status Achieved      PT LONG TERM GOAL #5   Title Patient will be able to ambulate at least 350 feet in 2MWT in order to demonstrate improving endurance and activity tolerance.    Time 4    Period Weeks    Status New    Target Date 07/29/21                   Plan - 07/16/21 1048     Clinical Impression Statement Session limited by patient requiring rest room time today at beginning of session. Continued with LE strengthening  exercises. Patient fatigues quickly today requiring frequent seated rest breaks. Patient will continue to benefit from skilled physical therapy in order to reduce impairment and improve function.    Personal Factors and Comorbidities Comorbidity 1;Comorbidity 3+;Comorbidity 2    Comorbidities CHF, left knee surgery, right chronic foot wound, DB, restless leg syndromes    Examination-Activity Limitations Bathing;Lift;Locomotion Level;Transfers;Stand;Stairs;Squat;Reach Overhead;Carry    Examination-Participation Restrictions Community Activity;Meal Prep    Stability/Clinical Decision Making Evolving/Moderate complexity    Rehab Potential Fair    PT Frequency 2x / week    PT Duration 4 weeks    PT Treatment/Interventions ADLs/Self Care Home Management;Other (comment);Moist Heat;Balance training;Therapeutic exercise;Therapeutic activities;Functional mobility training;Stair training;Gait training;DME Instruction;Ultrasound;Neuromuscular re-education;Patient/family education;Orthotic Fit/Training;Manual techniques;Taping;Passive range of motion;Electrical Stimulation    PT Next Visit Plan focus on gait mechanics, glute strength and balance.    PT Home Exercise Plan hip abd/add isometric, QS, hamstring stretch;  04/15/21: Isometric abd, abd supine, SAQ, bridge 9/1 clamshell, sideyling hip abduciton 9/21 standing hip abduction/ extenison, knee flexion, heel raise/ toe raise    Consulted and Agree with Plan of Care Patient             Patient will benefit from skilled therapeutic intervention  in order to improve the following deficits and impairments:  Abnormal gait, Decreased endurance, Decreased skin integrity, Increased edema, Decreased activity tolerance, Decreased balance, Decreased mobility, Decreased knowledge of use of DME, Decreased strength, Difficulty walking, Pain, Decreased range of motion, Impaired flexibility, Postural dysfunction, Improper body mechanics  Visit Diagnosis: Difficulty in  walking, not elsewhere classified  Unsteadiness on feet  Muscle weakness (generalized)  Other symptoms and signs involving the musculoskeletal system     Problem List Patient Active Problem List   Diagnosis Date Noted   Hypokalemia 05/18/2021   Muscular deconditioning 05/12/2021   Frequent falls 05/12/2021   Fatigue 04/29/2021   Recent unexplained weight loss 04/23/2021   Left shoulder pain 04/23/2021   Chest tightness 04/23/2021   Deep tissue injury 04/23/2021   Myoclonus 04/15/2021   Pneumonia 04/03/2021   PNA (pneumonia) 04/02/2021   Abnormal finding on urinalysis 04/01/2021   Syncope 02/16/2021   Minor head injury    Right hip pain 02/11/2021   Erectile dysfunction 01/22/2021   Gait abnormality 09/30/2020   Mild non proliferative diabetic retinopathy ([place]) 07/16/2020   Need for immunization against influenza 06/03/2020   S/P left knee arthroscopy 05/06/20 05/13/2020   Abnormal MRI 03/26/2020   Joint disorder of knee 03/26/2020   Fall at home, initial encounter 03/13/2020   Anemia 09/03/2019   Reflux esophagitis 09/03/2019   Esophageal dysphagia 05/30/2019   Constipation 05/30/2019   Positive colorectal cancer screening using Cologuard test 05/30/2019   Diabetic foot ulcer ([place]) 09/06/2018   Orthostasis 08/25/2016   Congestive heart failure ([place]) 05/17/2016   Bilateral carotid artery disease ([place]) 07/08/2014   Obstructive sleep apnea 02/12/2014   Restless legs 10/23/2013   Overweight with body mass index (BMI) of 29 to 29.9 in adult 10/23/2013   Hyperlipidemia 07/16/2013   Peripheral arterial disease ([place]) 07/16/2013   Hypertension 03/14/2012   Type 2 diabetes mellitus with circulatory disorder ([place]) 03/14/2012   Peripheral neuropathy 03/14/2012   Cardiomyopathy- EF NL 01/2013, now 30-35% echo 03/14/2012    11:26 AM, 07/16/21 Mearl Latin PT, DPT Physical Therapist at [place], [place], [address] Phone: [phone]   Fax:  [phone]  Name: [name] MRN: [id number] Date of Birth: 1945-12-25

## 2021-07-17 ENCOUNTER — Encounter (HOSPITAL_COMMUNITY): Payer: HMO | Admitting: Occupational Therapy

## 2021-07-21 ENCOUNTER — Ambulatory Visit (HOSPITAL_COMMUNITY): Payer: HMO | Attending: Family Medicine | Admitting: Physical Therapy

## 2021-07-21 ENCOUNTER — Other Ambulatory Visit: Payer: Self-pay

## 2021-07-21 DIAGNOSIS — R2681 Unsteadiness on feet: Secondary | ICD-10-CM | POA: Diagnosis not present

## 2021-07-21 DIAGNOSIS — R262 Difficulty in walking, not elsewhere classified: Secondary | ICD-10-CM | POA: Insufficient documentation

## 2021-07-21 DIAGNOSIS — M6281 Muscle weakness (generalized): Secondary | ICD-10-CM | POA: Diagnosis not present

## 2021-07-21 DIAGNOSIS — R29898 Other symptoms and signs involving the musculoskeletal system: Secondary | ICD-10-CM | POA: Diagnosis not present

## 2021-07-21 NOTE — Therapy (Signed)
[facility] [address] [place], [place], [address] Phone: [phone]   Fax:  [phone]  Physical Therapy Treatment  Patient Details  Name: [name] MRN: [id number] Date of Birth: 10-09-1945 Referring Provider (PT): Noreene Larsson, NP   Encounter Date: 07/21/2021   PT End of Session - 07/21/21 1135     Visit Number 23    Number of Visits 34    Date for PT Re-Evaluation 07/29/21    Authorization Type [employer], no VL, no auth ,    Progress Note Due on Visit 29    PT Start Time 1045    PT Stop Time 1941    PT Time Calculation (min) 49 min    Equipment Utilized During Treatment Gait belt    Activity Tolerance Patient tolerated treatment well    Behavior During Therapy WFL for tasks assessed/performed             Past Medical History:  Diagnosis Date   Aftercare following surgery of the circulatory system, NEC 12/04/2013   Angina decubitus ([place]) 05/19/2016   Arrhythmia 05/20/2016   Arthritis    ARTHRITIS, RIGHT FOOT 06/26/2008   Qualifier: Diagnosis of  By: Aline Brochure MD, [name]     Cardiomyopathy- EF NL 01/2013, now 30-35% echo 03/14/2012   Carotid artery occlusion    left s/p CEA   Cellulitis of left foot 05/07/2018   CHF (congestive heart failure) ([place])    Critical lower limb ischemia ([place]) 02/28/2014   Critical limb ischemia    Diabetes mellitus    Elevated troponin 04/11/2016   GERD (gastroesophageal reflux disease)    History of stroke June 2013 03/14/2012   HOH (hard of hearing)    Hypertension    Hypothyroidism    Nonhealing skin ulcer ([place]) 10/28/2014   Obstructive sleep apnea    Orthostatic hypotension    Osteomyelitis ([place])    left great toe   Pain in the chest 05/17/2016   Peripheral arterial disease ([place]), s/p PTA x 2 RLE    nonhealing ulcers bilaterally on each great toe   Pneumonia    PONV (postoperative nausea and vomiting)    Pulmonary nodule 05/17/2016   Restless leg syndrome     Shortness of breath    Stroke [facility]) March 08, 2012   Toe osteomyelitis, left [facility])    Ulcer of great toe, left, with necrosis of bone ([place])    Wears dentures     Past Surgical History:  Procedure Laterality Date   AMPUTATION TOE Left 07/25/2018   Procedure: AMPUTATION TOE INTERPHALANGEAL HALLUX LEFT;  Surgeon: Evelina Bucy, DPM;  Location: [facility];  Service: Podiatry;  Laterality: Left;   ANGIOPLASTY  02/28/14   diamond back orbital rotational atherectomy of Rt. tibial   BACK SURGERY     BONE BIOPSY Left 07/25/2018   Procedure: SUPERFICIAL BONE BIOPSY;  Surgeon: Evelina Bucy, DPM;  Location: [facility];  Service: Podiatry;  Laterality: Left;   CARDIAC CATHETERIZATION N/A 05/19/2016   Procedure: Right/Left Heart Cath and Coronary Angiography;  Surgeon: Belva Crome, MD;  Location: [place] [facility] CV LAB;  Service: Cardiovascular;  Laterality: N/A;   CERVICAL FUSION     ENDARTERECTOMY Left 11/29/2013   Procedure: ENDARTERECTOMY CAROTID;  Surgeon: Serafina Mitchell, MD;  Location: [facility];  Service: Vascular;  Laterality: Left;   ESOPHAGOGASTRODUODENOSCOPY  02/2010   Dr. Gala Romney: patient presented with food impaction, schatzki ring with superimposed component of stricture with erosive  reflux esophagitis, s/p disimpaction but dilation planned at later date    ESOPHAGOGASTRODUODENOSCOPY (EGD) WITH PROPOFOL N/A 08/13/2019   Dr. Gala Romney: Erosive reflux esophagitis with mild stricture and incidental Mallory-Weiss tear which precluded esophageal dilation.  Medium sized hiatal hernia.   ESOPHAGOGASTRODUODENOSCOPY (EGD) WITH PROPOFOL N/A 10/11/2019   Dr. Gala Romney: Esophageal stenosis status post dilation, moderate hiatal hernia   EYE SURGERY     FLEXIBLE SIGMOIDOSCOPY N/A 08/13/2019   Procedure: FLEXIBLE SIGMOIDOSCOPY;  Surgeon: [name], MD;  [facility];  Service: Endoscopy;  Laterality: N/A;  colonoscopy aboerted due to formed stool and poor prep   FOOT SURGERY     KNEE ARTHROSCOPY WITH MEDIAL  MENISECTOMY Left 05/06/2020   Procedure: KNEE ARTHROSCOPY WITH MEDIAL MENISCECTOMY AND LATERAL MENISCECTOMY;  Surgeon: [name], MD;  [facility];  Service: Orthopedics;  Laterality: Left;   Lower ext duplex doppler  03/14/14   Rt ABI 1.2   LOWER EXTREMITY ANGIOGRAM Bilateral 02/18/2014   Procedure: LOWER EXTREMITY ANGIOGRAM;  Surgeon: [name], MD;  Location: [facility] CATH LAB;  Service: Cardiovascular;  Laterality: Bilateral;   LOWER EXTREMITY ANGIOGRAM N/A 10/31/2014   Procedure: LOWER EXTREMITY ANGIOGRAM;  Surgeon: [name], MD;  Location: [facility] CATH LAB;  Service: Cardiovascular;  Laterality: N/A;   MALONEY DILATION N/A 10/11/2019   Procedure: Venia Minks DILATION;  Surgeon: [name], MD;  [facility];  Service: Endoscopy;  Laterality: N/A;   MULTIPLE TOOTH EXTRACTIONS     PV angiogram  02/18/2014   tibial vessel diseas bil.   SPINE SURGERY     tendon achillies lengthing and sesamoid      There were no vitals filed for this visit.   Subjective Assessment - 07/21/21 1054     Subjective pt states his HEP is going better than it was.  No pain or issues today.    Currently in Pain? No/denies                               [facility] Adult PT Treatment/Exercise - 07/21/21 0001       Knee/Hip Exercises: Standing   Hip Abduction Both;3 sets;10 reps    Abduction Limitations 5# without foot touching    Hip Extension Both;3 sets;10 reps    Extension Limitations 5# without toe touching    Forward Step Up Both;3 sets;10 reps;Step Height: 6"    Forward Step Up Limitations bilaterally with UE assist    SLS with Vectors each LE with 1 HHA 10X3" each    Other Standing Knee Exercises tandem stance max of 6" with Lt leading, 10" with Rt leading without UE assist X 5 trials.      Knee/Hip Exercises: Seated   Sit to Sand 3 sets;10 reps;without UE support                       PT Short Term Goals - 07/01/21 1022       PT SHORT  TERM GOAL #1   Title Patient will be independent in self management strategies to improve quality of life and functional outcomes.    Baseline Reports compliance and going to the [facility]    Time 3    Period Weeks    Status Achieved    Target Date 04/27/21      PT SHORT TERM GOAL #2   Title Patient will be able to report at least  25% improvement in overall mobility.    Time 3    Period Weeks    Status Achieved    Target Date 04/27/21      PT SHORT TERM GOAL #3   Title Demo improved balance and BLE strength as evidenced by time of 18 sec 5xSTS test    Baseline 20.71 seconds - light headed 10/12 13 seconds    Time 3    Period Weeks    Status Achieved    Target Date 04/27/21               PT Long Term Goals - 07/01/21 1023       PT LONG TERM GOAL #1   Title Patient will improve FOTO score by at least 10 points in order to indicate improved tolerance to activity.    Baseline 2% increase    Time 6    Period Weeks    Status On-going      PT LONG TERM GOAL #2   Title Demo improved gait velocity and safety as evidenced by distance of 150 ft during 2MWT    Baseline 60 ft with rollator 10/12 250 feet with rollator    Time 6    Period Weeks    Status Achieved      PT LONG TERM GOAL #3   Title Patient will not have foot wound present to reduce risk of infection.    Time 4    Period Weeks    Status Achieved      PT LONG TERM GOAL #4   Title Patient will verbalize proper foot care techniques.    Time 4    Period Weeks    Status Achieved      PT LONG TERM GOAL #5   Title Patient will be able to ambulate at least 350 feet in 2MWT in order to demonstrate improving endurance and activity tolerance.    Time 4    Period Weeks    Status New    Target Date 07/29/21                   Plan - 07/21/21 1138     Clinical Impression Statement Began with static stabilization activities. Pt with most difficuly with Lt in lead maintaining tandem stance without UE assist.   Vectors completed with cues hold each position allotted time, especially with lateral movements.   Pt with continued difficulty extending knees fully with all activities and tendency to scissor with gait.  Cues to place foot fully on step with step ups and rely less on UE's with all activities.    Personal Factors and Comorbidities Comorbidity 1;Comorbidity 3+;Comorbidity 2    Comorbidities CHF, left knee surgery, right chronic foot wound, DB, restless leg syndromes    Examination-Activity Limitations Bathing;Lift;Locomotion Level;Transfers;Stand;Stairs;Squat;Reach Overhead;Carry    Examination-Participation Restrictions Community Activity;Meal Prep    Stability/Clinical Decision Making Evolving/Moderate complexity    Rehab Potential Fair    PT Frequency 2x / week    PT Duration 4 weeks    PT Treatment/Interventions ADLs/Self Care Home Management;Other (comment);Moist Heat;Balance training;Therapeutic exercise;Therapeutic activities;Functional mobility training;Stair training;Gait training;DME Instruction;Ultrasound;Neuromuscular re-education;Patient/family education;Orthotic Fit/Training;Manual techniques;Taping;Passive range of motion;Electrical Stimulation    PT Next Visit Plan focus on gait mechanics, glute strength and balance.    PT Home Exercise Plan hip abd/add isometric, QS, hamstring stretch;  04/15/21: Isometric abd, abd supine, SAQ, bridge 9/1 clamshell, sideyling hip abduciton 9/21 standing hip abduction/ extenison, knee flexion, heel raise/ toe raise  Consulted and Agree with Plan of Care Patient             Patient will benefit from skilled therapeutic intervention in order to improve the following deficits and impairments:  Abnormal gait, Decreased endurance, Decreased skin integrity, Increased edema, Decreased activity tolerance, Decreased balance, Decreased mobility, Decreased knowledge of use of DME, Decreased strength, Difficulty walking, Pain, Decreased range of motion,  Impaired flexibility, Postural dysfunction, Improper body mechanics  Visit Diagnosis: Difficulty in walking, not elsewhere classified  Unsteadiness on feet  Muscle weakness (generalized)  Other symptoms and signs involving the musculoskeletal system     Problem List Patient Active Problem List   Diagnosis Date Noted   Hypokalemia 05/18/2021   Muscular deconditioning 05/12/2021   Frequent falls 05/12/2021   Fatigue 04/29/2021   Recent unexplained weight loss 04/23/2021   Left shoulder pain 04/23/2021   Chest tightness 04/23/2021   Deep tissue injury 04/23/2021   Myoclonus 04/15/2021   Pneumonia 04/03/2021   PNA (pneumonia) 04/02/2021   Abnormal finding on urinalysis 04/01/2021   Syncope 02/16/2021   Minor head injury    Right hip pain 02/11/2021   Erectile dysfunction 01/22/2021   Gait abnormality 09/30/2020   Mild non proliferative diabetic retinopathy ([place]) 07/16/2020   Need for immunization against influenza 06/03/2020   S/P left knee arthroscopy 05/06/20 05/13/2020   Abnormal MRI 03/26/2020   Joint disorder of knee 03/26/2020   Fall at home, initial encounter 03/13/2020   Anemia 09/03/2019   Reflux esophagitis 09/03/2019   Esophageal dysphagia 05/30/2019   Constipation 05/30/2019   Positive colorectal cancer screening using Cologuard test 05/30/2019   Diabetic foot ulcer ([place]) 09/06/2018   Orthostasis 08/25/2016   Congestive heart failure ([place]) 05/17/2016   Bilateral carotid artery disease ([place]) 07/08/2014   Obstructive sleep apnea 02/12/2014   Restless legs 10/23/2013   Overweight with body mass index (BMI) of 29 to 29.9 in adult 10/23/2013   Hyperlipidemia 07/16/2013   Peripheral arterial disease ([place]) 07/16/2013   Hypertension 03/14/2012   Type 2 diabetes mellitus with circulatory disorder ([place]) 03/14/2012   Peripheral neuropathy 03/14/2012   Cardiomyopathy- EF NL 01/2013, now 30-35% echo 03/14/2012   Teena Irani, PTA/CLT, WTA [phone]  Teena Irani, PTA 07/21/2021, 11:38 AM  [facility] [address] [place], [place], [address] Phone: [phone]   Fax:  [phone]  Name: [name] MRN: [id number] Date of Birth: 04-28-1946

## 2021-07-23 ENCOUNTER — Other Ambulatory Visit: Payer: Self-pay

## 2021-07-23 ENCOUNTER — Encounter (HOSPITAL_COMMUNITY): Payer: Self-pay | Admitting: Physical Therapy

## 2021-07-23 ENCOUNTER — Ambulatory Visit (HOSPITAL_COMMUNITY): Payer: HMO | Admitting: Physical Therapy

## 2021-07-23 DIAGNOSIS — M6281 Muscle weakness (generalized): Secondary | ICD-10-CM

## 2021-07-23 DIAGNOSIS — R262 Difficulty in walking, not elsewhere classified: Secondary | ICD-10-CM | POA: Diagnosis not present

## 2021-07-23 DIAGNOSIS — R29898 Other symptoms and signs involving the musculoskeletal system: Secondary | ICD-10-CM

## 2021-07-23 DIAGNOSIS — R2681 Unsteadiness on feet: Secondary | ICD-10-CM

## 2021-07-23 NOTE — Therapy (Signed)
[facility] [address] [place], [place], [address] Phone: [phone]   Fax:  [phone]  Physical Therapy Treatment  Patient Details  Name: [name] MRN: [id number] Date of Birth: September 13, 1946 Referring Provider (PT): Noreene Larsson, NP   Encounter Date: 07/23/2021   PT End of Session - 07/23/21 1045     Visit Number 24    Number of Visits 34    Date for PT Re-Evaluation 07/29/21    Authorization Type [employer], no VL, no auth ,    Progress Note Due on Visit 29    PT Start Time 4967    PT Stop Time 1125    PT Time Calculation (min) 40 min    Equipment Utilized During Treatment Gait belt    Activity Tolerance Patient tolerated treatment well    Behavior During Therapy [facility] for tasks assessed/performed             Past Medical History:  Diagnosis Date   Aftercare following surgery of the circulatory system, NEC 12/04/2013   Angina decubitus ([place]) 05/19/2016   Arrhythmia 05/20/2016   Arthritis    ARTHRITIS, RIGHT FOOT 06/26/2008   Qualifier: Diagnosis of  By: Aline Brochure MD, [name]     Cardiomyopathy- EF NL 01/2013, now 30-35% echo 03/14/2012   Carotid artery occlusion    left s/p CEA   Cellulitis of left foot 05/07/2018   CHF (congestive heart failure) ([place])    Critical lower limb ischemia ([place]) 02/28/2014   Critical limb ischemia    Diabetes mellitus    Elevated troponin 04/11/2016   GERD (gastroesophageal reflux disease)    History of stroke June 2013 03/14/2012   HOH (hard of hearing)    Hypertension    Hypothyroidism    Nonhealing skin ulcer ([place]) 10/28/2014   Obstructive sleep apnea    Orthostatic hypotension    Osteomyelitis ([place])    left great toe   Pain in the chest 05/17/2016   Peripheral arterial disease ([place]), s/p PTA x 2 RLE    nonhealing ulcers bilaterally on each great toe   Pneumonia    PONV (postoperative nausea and vomiting)    Pulmonary nodule 05/17/2016   Restless leg syndrome     Shortness of breath    Stroke [facility]) March 08, 2012   Toe osteomyelitis, left [facility])    Ulcer of great toe, left, with necrosis of bone ([place])    Wears dentures     Past Surgical History:  Procedure Laterality Date   AMPUTATION TOE Left 07/25/2018   Procedure: AMPUTATION TOE INTERPHALANGEAL HALLUX LEFT;  Surgeon: Evelina Bucy, DPM;  Location: [facility];  Service: Podiatry;  Laterality: Left;   ANGIOPLASTY  02/28/14   diamond back orbital rotational atherectomy of Rt. tibial   BACK SURGERY     BONE BIOPSY Left 07/25/2018   Procedure: SUPERFICIAL BONE BIOPSY;  Surgeon: Evelina Bucy, DPM;  Location: [place];  Service: Podiatry;  Laterality: Left;   CARDIAC CATHETERIZATION N/A 05/19/2016   Procedure: Right/Left Heart Cath and Coronary Angiography;  Surgeon: Belva Crome, MD;  Location: [place] CV LAB;  Service: Cardiovascular;  Laterality: N/A;   CERVICAL FUSION     ENDARTERECTOMY Left 11/29/2013   Procedure: ENDARTERECTOMY CAROTID;  Surgeon: Serafina Mitchell, MD;  Location: [facility];  Service: Vascular;  Laterality: Left;   ESOPHAGOGASTRODUODENOSCOPY  02/2010   Dr. Gala Romney: patient presented with food impaction, schatzki ring with superimposed component of stricture with erosive  reflux esophagitis, s/p disimpaction but dilation planned at later date    ESOPHAGOGASTRODUODENOSCOPY (EGD) WITH PROPOFOL N/A 08/13/2019   Dr. Gala Romney: Erosive reflux esophagitis with mild stricture and incidental Mallory-Weiss tear which precluded esophageal dilation.  Medium sized hiatal hernia.   ESOPHAGOGASTRODUODENOSCOPY (EGD) WITH PROPOFOL N/A 10/11/2019   Dr. Gala Romney: Esophageal stenosis status post dilation, moderate hiatal hernia   EYE SURGERY     FLEXIBLE SIGMOIDOSCOPY N/A 08/13/2019   Procedure: FLEXIBLE SIGMOIDOSCOPY;  Surgeon: [name], MD;  [facility];  Service: Endoscopy;  Laterality: N/A;  colonoscopy aboerted due to formed stool and poor prep   FOOT SURGERY     KNEE ARTHROSCOPY WITH MEDIAL  MENISECTOMY Left 05/06/2020   Procedure: KNEE ARTHROSCOPY WITH MEDIAL MENISCECTOMY AND LATERAL MENISCECTOMY;  Surgeon: [name], MD;  [facility];  Service: Orthopedics;  Laterality: Left;   Lower ext duplex doppler  03/14/14   Rt ABI 1.2   LOWER EXTREMITY ANGIOGRAM Bilateral 02/18/2014   Procedure: LOWER EXTREMITY ANGIOGRAM;  Surgeon: [name], MD;  Location: [facility] CATH LAB;  Service: Cardiovascular;  Laterality: Bilateral;   LOWER EXTREMITY ANGIOGRAM N/A 10/31/2014   Procedure: LOWER EXTREMITY ANGIOGRAM;  Surgeon: [name], MD;  Location: [facility] CATH LAB;  Service: Cardiovascular;  Laterality: N/A;   MALONEY DILATION N/A 10/11/2019   Procedure: Venia Minks DILATION;  Surgeon: [name], MD;  [facility];  Service: Endoscopy;  Laterality: N/A;   MULTIPLE TOOTH EXTRACTIONS     PV angiogram  02/18/2014   tibial vessel diseas bil.   SPINE SURGERY     tendon achillies lengthing and sesamoid      There were no vitals filed for this visit.   Subjective Assessment - 07/23/21 1046     Subjective Patient states his restless legs are bothering him today.    Currently in Pain? No/denies                               [facility] Adult PT Treatment/Exercise - 07/23/21 0001       Knee/Hip Exercises: Standing   Hip Flexion Both;3 sets;10 reps    Hip Flexion Limitations 5lb    Forward Lunges Both;3 sets;10 reps    Hip Abduction Both;3 sets;10 reps    Abduction Limitations 5# without foot touching    Hip Extension Both;3 sets;10 reps    Extension Limitations 5# without toe touching    Lateral Step Up Both;2 sets;10 reps;Hand Hold: 2;Step Height: 6"    Forward Step Up Both;3 sets;10 reps;Step Height: 6"    Forward Step Up Limitations bilaterally with UE assist    Other Standing Knee Exercises tandem stance 5 x 10 second holds bilateral    Other Standing Knee Exercises retro stepping in // bars 6x 10 feet                     PT  Education - 07/23/21 1046     Education Details HEP    Person(s) Educated Patient    Methods Explanation;Demonstration    Comprehension Verbalized understanding;Returned demonstration              PT Short Term Goals - 07/01/21 1022       PT SHORT TERM GOAL #1   Title Patient will be independent in self management strategies to improve quality of life and functional outcomes.    Baseline Reports compliance and going to  the [employer]    Time 3    Period Weeks    Status Achieved    Target Date 04/27/21      PT SHORT TERM GOAL #2   Title Patient will be able to report at least 25% improvement in overall mobility.    Time 3    Period Weeks    Status Achieved    Target Date 04/27/21      PT SHORT TERM GOAL #3   Title Demo improved balance and BLE strength as evidenced by time of 18 sec 5xSTS test    Baseline 20.71 seconds - light headed 10/12 13 seconds    Time 3    Period Weeks    Status Achieved    Target Date 04/27/21               PT Long Term Goals - 07/01/21 1023       PT LONG TERM GOAL #1   Title Patient will improve FOTO score by at least 10 points in order to indicate improved tolerance to activity.    Baseline 2% increase    Time 6    Period Weeks    Status On-going      PT LONG TERM GOAL #2   Title Demo improved gait velocity and safety as evidenced by distance of 150 ft during 2MWT    Baseline 60 ft with rollator 10/12 250 feet with rollator    Time 6    Period Weeks    Status Achieved      PT LONG TERM GOAL #3   Title Patient will not have foot wound present to reduce risk of infection.    Time 4    Period Weeks    Status Achieved      PT LONG TERM GOAL #4   Title Patient will verbalize proper foot care techniques.    Time 4    Period Weeks    Status Achieved      PT LONG TERM GOAL #5   Title Patient will be able to ambulate at least 350 feet in 2MWT in order to demonstrate improving endurance and activity tolerance.    Time 4    Period  Weeks    Status New    Target Date 07/29/21                   Plan - 07/23/21 1045     Clinical Impression Statement Continued with weighted hip exercises in which patient requires UE support for impaired strength and balance. Patient demonstrating improving motor control with hip strengthening exercises although deficit still present. Intermittent cueing required for mechanics and posture throughout session with good/fair carry over. Patient will continue to benefit from skilled physical therapy in order to improve function and reduce impairment.    Personal Factors and Comorbidities Comorbidity 1;Comorbidity 3+;Comorbidity 2    Comorbidities CHF, left knee surgery, right chronic foot wound, DB, restless leg syndromes    Examination-Activity Limitations Bathing;Lift;Locomotion Level;Transfers;Stand;Stairs;Squat;Reach Overhead;Carry    Examination-Participation Restrictions Community Activity;Meal Prep    Stability/Clinical Decision Making Evolving/Moderate complexity    Rehab Potential Fair    PT Frequency 2x / week    PT Duration 4 weeks    PT Treatment/Interventions ADLs/Self Care Home Management;Other (comment);Moist Heat;Balance training;Therapeutic exercise;Therapeutic activities;Functional mobility training;Stair training;Gait training;DME Instruction;Ultrasound;Neuromuscular re-education;Patient/family education;Orthotic Fit/Training;Manual techniques;Taping;Passive range of motion;Electrical Stimulation    PT Next Visit Plan focus on gait mechanics, glute strength and balance.    PT Home Exercise  Plan hip abd/add isometric, QS, hamstring stretch;  04/15/21: Isometric abd, abd supine, SAQ, bridge 9/1 clamshell, sideyling hip abduciton 9/21 standing hip abduction/ extenison, knee flexion, heel raise/ toe raise    Consulted and Agree with Plan of Care Patient             Patient will benefit from skilled therapeutic intervention in order to improve the following deficits and  impairments:  Abnormal gait, Decreased endurance, Decreased skin integrity, Increased edema, Decreased activity tolerance, Decreased balance, Decreased mobility, Decreased knowledge of use of DME, Decreased strength, Difficulty walking, Pain, Decreased range of motion, Impaired flexibility, Postural dysfunction, Improper body mechanics  Visit Diagnosis: Difficulty in walking, not elsewhere classified  Unsteadiness on feet  Muscle weakness (generalized)  Other symptoms and signs involving the musculoskeletal system     Problem List Patient Active Problem List   Diagnosis Date Noted   Hypokalemia 05/18/2021   Muscular deconditioning 05/12/2021   Frequent falls 05/12/2021   Fatigue 04/29/2021   Recent unexplained weight loss 04/23/2021   Left shoulder pain 04/23/2021   Chest tightness 04/23/2021   Deep tissue injury 04/23/2021   Myoclonus 04/15/2021   Pneumonia 04/03/2021   PNA (pneumonia) 04/02/2021   Abnormal finding on urinalysis 04/01/2021   Syncope 02/16/2021   Minor head injury    Right hip pain 02/11/2021   Erectile dysfunction 01/22/2021   Gait abnormality 09/30/2020   Mild non proliferative diabetic retinopathy ([place]) 07/16/2020   Need for immunization against influenza 06/03/2020   S/P left knee arthroscopy 05/06/20 05/13/2020   Abnormal MRI 03/26/2020   Joint disorder of knee 03/26/2020   Fall at home, initial encounter 03/13/2020   Anemia 09/03/2019   Reflux esophagitis 09/03/2019   Esophageal dysphagia 05/30/2019   Constipation 05/30/2019   Positive colorectal cancer screening using Cologuard test 05/30/2019   Diabetic foot ulcer ([place]) 09/06/2018   Orthostasis 08/25/2016   Congestive heart failure ([place]) 05/17/2016   Bilateral carotid artery disease ([place]) 07/08/2014   Obstructive sleep apnea 02/12/2014   Restless legs 10/23/2013   Overweight with body mass index (BMI) of 29 to 29.9 in adult 10/23/2013   Hyperlipidemia 07/16/2013   Peripheral arterial  disease ([place]) 07/16/2013   Hypertension 03/14/2012   Type 2 diabetes mellitus with circulatory disorder ([place]) 03/14/2012   Peripheral neuropathy 03/14/2012   Cardiomyopathy- EF NL 01/2013, now 30-35% echo 03/14/2012    11:20 AM, 07/23/21 Mearl Latin PT, DPT Physical Therapist at [facility] [place], [place], [address] Phone: [phone]   Fax:  [phone]  Name: [name] MRN: [id number] Date of Birth: 1946/01/22

## 2021-08-04 ENCOUNTER — Encounter: Payer: Self-pay | Admitting: Orthopaedic Surgery

## 2021-08-04 ENCOUNTER — Other Ambulatory Visit: Payer: Self-pay

## 2021-08-04 ENCOUNTER — Ambulatory Visit: Payer: HMO

## 2021-08-04 ENCOUNTER — Ambulatory Visit: Payer: HMO | Admitting: Orthopaedic Surgery

## 2021-08-04 VITALS — BP 146/69 | HR 76 | Ht 72.0 in | Wt 191.0 lb

## 2021-08-04 DIAGNOSIS — Z9889 Other specified postprocedural states: Secondary | ICD-10-CM

## 2021-08-04 DIAGNOSIS — R531 Weakness: Secondary | ICD-10-CM

## 2021-08-04 DIAGNOSIS — G8929 Other chronic pain: Secondary | ICD-10-CM | POA: Diagnosis not present

## 2021-08-04 DIAGNOSIS — M25562 Pain in left knee: Secondary | ICD-10-CM | POA: Diagnosis not present

## 2021-08-04 DIAGNOSIS — G2581 Restless legs syndrome: Secondary | ICD-10-CM | POA: Diagnosis not present

## 2021-08-04 DIAGNOSIS — Z8673 Personal history of transient ischemic attack (TIA), and cerebral infarction without residual deficits: Secondary | ICD-10-CM | POA: Diagnosis not present

## 2021-08-04 NOTE — Progress Notes (Signed)
I am not walking well.  He has a long complicated medical history.  He had a CVA which affected the left side of his body about three to four years ago.  He has residual weakness.  He was admitted to the hospital 04-02-21 with community acquired pneumonia.  He did not have COVID.  He stayed there two days then went to [facility] for rehabilitation for about 21 days, then has been to PT as outpatient for generalized weakness.  His discharge diagnosis from the hospital was Discharge Diagnoses:  Principal Problem:   PNA (pneumonia) Active Problems:   Hypertension   Type 2 diabetes mellitus with circulatory disorder (HCC)   Obstructive sleep apnea   Congestive heart failure ([place])   Principal discharge diagnosis: Community-acquired pneumonia  His diabetes is under control.  He has had amputation is past of the left great toe partial. He has had no CHF problems this year.  He has significant restless leg syndrome at night.  His chief concern is that he has not improved very well with the rehab and PT.  He is not walking well.  He uses a walker.  He has inability to fully extend the left knee when walking which causes significant gait problems.  He had knee surgery by Dr. Aline Brochure for arthroscopy of the knee in past 05-06-20.  That went well but he has arthritis pain of the left knee.  He says he has joined the [facility] for exercises but is not making much progress.  He is discouraged.    He wants to know if he can progress with his exercises or is this just a "pie in the sky" idea.  I have reviewed his Admission from the hospital and Discharge data.  I have reviewed his multiple PT visits over the last several months.  He is mentally alert.  He lives with his wife.  He drives to his appointments and go get food.  He uses a scooter cart at the grocery store but it wears him out to do the shopping.  His endurance has increased some but not as much as he has hoped.  He is realistic in his  thinking and concerned about "is this the best I can expect and do, or is there more to help me".  I spent a good amount of time with him today talking about his problems and concerns, about an hour.  He has decreased ROM of the hips, more on the left.  Internal is 10 on the left, 15 on the right, external is 15 on the left, 20 on the right, flexion to 90 both.  He cannot fully extend the left knee lacking about 5 degrees. He can extend the right knee but it hurts.  ROM of the left is 5 to 95 with crepitus and effusion, the right is 0 (which he has to really try to get) and to 105.  Both knees are stable.  He has an abnormal gait.  He waddles and the left side is weak.  Muscle strength on the left is about a 3.5 of 5, 5 of 5 on the right.  Reflexes intact bilaterally.  Pulses intact.  Post partial amputation left great toe.  He is post lumbar surgery and has scar.  I have independently reviewed and interpreted x-rays of this patient done at another site by another physician or qualified health professional.  X-rays were done today of the left knee, reported separately.  Encounter Diagnoses  Name Primary?  S/P left knee arthroscopy    Left-sided weakness Yes   Status post CVA    Chronic pain of left knee    Restless leg syndrome      I will have him see Dr. Ernestina [name] and have evaluation and EMGs.  I would appreciate his opinion as to further therapy possibilities and what to expect.    Patient needs to use walker. Continue his home therapy.  PROCEDURE NOTE:  The patient requests injections of the left knee , verbal consent was obtained.  The left knee was prepped appropriately after time out was performed.   Sterile technique was observed and injection of 1 cc of DepoMedrol 40mg  with several cc's of plain xylocaine. Anesthesia was provided by ethyl chloride and a 20-gauge needle was used to inject the knee area. The injection was tolerated well.  A band aid dressing was  applied.  The patient was advised to apply ice later today and tomorrow to the injection sight as needed.  Return in one month.  Call if any problem.  Precautions discussed.  Electronically Signed Christopher Kava, MD 11/15/202210:52 AM

## 2021-08-25 ENCOUNTER — Encounter: Payer: Self-pay | Admitting: Physical Medicine & Rehabilitation

## 2021-08-26 ENCOUNTER — Ambulatory Visit (HOSPITAL_COMMUNITY)
Admission: RE | Admit: 2021-08-26 | Discharge: 2021-08-26 | Disposition: A | Discharge status: Home / Self Care | Payer: HMO | Source: Ambulatory Visit | Attending: Cardiovascular Disease | Admitting: Cardiovascular Disease

## 2021-08-26 ENCOUNTER — Other Ambulatory Visit: Payer: Self-pay

## 2021-08-26 DIAGNOSIS — I739 Peripheral vascular disease, unspecified: Secondary | ICD-10-CM | POA: Diagnosis not present

## 2021-09-03 DIAGNOSIS — G2581 Restless legs syndrome: Secondary | ICD-10-CM | POA: Diagnosis not present

## 2021-09-03 DIAGNOSIS — R296 Repeated falls: Secondary | ICD-10-CM | POA: Diagnosis not present

## 2021-09-03 DIAGNOSIS — Z79899 Other long term (current) drug therapy: Secondary | ICD-10-CM | POA: Diagnosis not present

## 2021-09-03 DIAGNOSIS — G253 Myoclonus: Secondary | ICD-10-CM | POA: Diagnosis not present

## 2021-09-15 ENCOUNTER — Encounter (HOSPITAL_COMMUNITY): Payer: Self-pay | Admitting: Physical Therapy

## 2021-09-15 NOTE — Therapy (Signed)
[place], [place], [address] Phone: [phone]   Fax:  [phone]  Patient Details  Name: [name] MRN: [id number] Date of Birth: 11-14-1945 Referring Provider:  No ref. provider found  Encounter Date: 09/15/2021   PHYSICAL THERAPY DISCHARGE SUMMARY  Visits from Start of Care: 24  Current functional level related to goals / functional outcomes: Patient had met all short term and 3/5 long term goals at last session attended.   Remaining deficits: Unknown as patient had not returned.   Education / Equipment: HEP   Patient agrees to discharge. Patient goals were partially met. Patient is being discharged due to not returning since the last visit.  8:59 AM, 09/15/21 Mearl Latin PT, DPT Physical Therapist at [place], [place], [address] Phone: [phone]   Fax:  [phone]

## 2021-09-22 ENCOUNTER — Other Ambulatory Visit: Payer: Self-pay

## 2021-09-22 ENCOUNTER — Ambulatory Visit (INDEPENDENT_AMBULATORY_CARE_PROVIDER_SITE_OTHER): Payer: HMO | Admitting: Podiatry

## 2021-09-22 ENCOUNTER — Ambulatory Visit: Payer: HMO

## 2021-09-22 ENCOUNTER — Telehealth: Payer: Self-pay | Admitting: Nurse Practitioner

## 2021-09-22 ENCOUNTER — Telehealth: Payer: Self-pay

## 2021-09-22 ENCOUNTER — Encounter: Payer: Self-pay | Admitting: Podiatry

## 2021-09-22 DIAGNOSIS — L97411 Non-pressure chronic ulcer of right heel and midfoot limited to breakdown of skin: Secondary | ICD-10-CM | POA: Diagnosis not present

## 2021-09-22 DIAGNOSIS — E11621 Type 2 diabetes mellitus with foot ulcer: Secondary | ICD-10-CM

## 2021-09-22 DIAGNOSIS — E119 Type 2 diabetes mellitus without complications: Secondary | ICD-10-CM

## 2021-09-22 DIAGNOSIS — Z89412 Acquired absence of left great toe: Secondary | ICD-10-CM | POA: Diagnosis not present

## 2021-09-22 DIAGNOSIS — E08621 Diabetes mellitus due to underlying condition with foot ulcer: Secondary | ICD-10-CM

## 2021-09-22 DIAGNOSIS — E1169 Type 2 diabetes mellitus with other specified complication: Secondary | ICD-10-CM

## 2021-09-22 DIAGNOSIS — B351 Tinea unguium: Secondary | ICD-10-CM | POA: Diagnosis not present

## 2021-09-22 DIAGNOSIS — E1142 Type 2 diabetes mellitus with diabetic polyneuropathy: Secondary | ICD-10-CM

## 2021-09-22 DIAGNOSIS — E1159 Type 2 diabetes mellitus with other circulatory complications: Secondary | ICD-10-CM

## 2021-09-22 DIAGNOSIS — M21961 Unspecified acquired deformity of right lower leg: Secondary | ICD-10-CM

## 2021-09-22 NOTE — Progress Notes (Signed)
SITUATION Reason for Consult: Evaluation for Prefabricated Diabetic Shoes and Bilateral Custom Diabetic Inserts. Patient / Caregiver Report: Patient would like velcro shoes  OBJECTIVE DATA: Patient History / Diagnosis:    ICD-10-CM   1. Type 2 diabetes mellitus with other circulatory complication, without long-term current use of insulin (HCC)  E11.59     2. Diabetic ulcer of right midfoot associated with diabetes mellitus due to underlying condition, with fat layer exposed ([place])  Q60.479    L97.412       Presence of Diabetic Complications: - Peripheral Neuropathy - Amputation: Side: left Level: hallux  Current or Previous Devices: Apex X801M shoes with insoles  In-Person Foot Examination:  Skin presentation:   Thin, Shiny, Hairless Nail presentation:   Thick, Ingrown, With Fungus Ulcers & Callousing:   Right met head ulcer  Toe / Foot Deformities:   - Pes Cavus - Hindfoot Varus  - Forefoot ADduction  - Hammertoes - Crossover toes   Sensation:    Diminished bilateral lower extremity  Shoe Size: 14W  ORTHOTIC RECOMMENDATION Recommended Devices: - 1x pair prefabricated PDAC approved diabetic shoes: G8010M 14W - 3x pair custom-to-patient direct vacuum formed diabetic insoles.   GOALS OF SHOES AND INSOLES - Reduce shear and pressure - Reduce / Prevent callus formation - Reduce / Prevent ulceration - Protect the fragile healing compromised diabetic foot.  Patient would benefit from diabetic shoes and inserts as patient has diabetes mellitus and the patient has one or more of the following conditions: - History of partial or complete amputation of the foot - History of previous foot ulceration. - History of pre-ulcerative callus - Peripheral neuropathy with evidence of callus formation - Foot deformity - Poor circulation  ACTIONS PERFORMED Patient was casted for insoles via crush box and measured for shoes via brannock device. Procedure was explained and patient  tolerated procedure well. All questions were answered and concerns addressed.  PLAN Insurance to be verified and out of pocket cost communicated to patient. Once cost verified and agreed upon and diabetic certification received, casts are to be sent to [facility] for fabrication. Patient is to be called for fitting when devices are ready.

## 2021-09-22 NOTE — Patient Instructions (Signed)
Follow up with Dr. March Rummage or Dr. Posey Pronto on this Friday.  DRESSING CHANGES right foot:   PHARMACY SHOPPING LIST: Saline or Wound Cleanser for cleaning wound 2 x 2 inch sterile gauze for cleaning wound Iodosorb Gel  A. IF DISPENSED, WEAR SURGICAL SHOE OR WALKING BOOT AT ALL TIMES.  B. IF PRESCRIBED ORAL ANTIBIOTICS, TAKE ALL MEDICATION AS PRESCRIBED UNTIL ALL ARE GONE.  C. IF DOCTOR HAS DESIGNATED NONWEIGHTBEARING STATUS, PLEASE ADHERE TO INSTRUCTIONS.  1. KEEP right foot DRY AT ALL TIMES!!!!  2. CLEANSE ULCER WITH SALINE OR WOUND CLEANSER.  3. DAB DRY WITH GAUZE SPONGE.  4. APPLY A LIGHT AMOUNT OF Iodosorb Gel TO BASE OF ULCER.  5. APPLY OUTER DRESSING AS INSTRUCTED.  6. WEAR SURGICAL SHOE/BOOT DAILY AT ALL TIMES. IF SUPPLIED, WEAR HEEL PROTECTORS AT ALL TIMES WHEN IN BED.  7. DO NOT WALK BAREFOOT!!!  8.  IF YOU EXPERIENCE ANY FEVER, CHILLS, NIGHTSWEATS, NAUSEA OR VOMITING, ELEVATED OR LOW BLOOD SUGARS, REPORT TO EMERGENCY ROOM.  9. IF YOU EXPERIENCE INCREASED REDNESS, PAIN, SWELLING, DISCOLORATION, ODOR, PUS, DRAINAGE OR WARMTH OF YOUR FOOT, REPORT TO EMERGENCY ROOM.

## 2021-09-22 NOTE — Telephone Encounter (Signed)
I have called patient to see if he is following M.Pearline Cables to the new office or if her is planning to stay at [facility].  The foot center has questions about who will be following him.  I LMOM

## 2021-09-22 NOTE — Progress Notes (Signed)
ANNUAL DIABETIC FOOT EXAM  Subjective: [name] presents today for for annual diabetic foot examination and at risk foot care. Patient has h/o amputation of partial amputation of L hallux  Patient states he did go to wound care and was discharged about six months ago. He relates callus on plantar aspect of right foot greater than 5 month duration. He has had no foot care since discharge from [facility]. States he has been filing callus down with mechanical file. Denies any redness, swelling or drainage. Denies any fever, chills, night sweats, nausea or vomiting.  [name] has longstanding h/o RLS of LLE. States it is very active today as he did not take his second dose of medication.  Patient did not check blood glucose this morning.  Noreene Larsson, NP is patient's PCP. Last visit was 05/18/2021.  Past Medical History:  Diagnosis Date   Aftercare following surgery of the circulatory system, NEC 12/04/2013   Angina decubitus ([place]) 05/19/2016   Arrhythmia 05/20/2016   Arthritis    ARTHRITIS, RIGHT FOOT 06/26/2008   Qualifier: Diagnosis of  By: Aline Brochure MD, [name]     Cardiomyopathy- EF NL 01/2013, now 30-35% echo 03/14/2012   Carotid artery occlusion    left s/p CEA   Cellulitis of left foot 05/07/2018   CHF (congestive heart failure) (HCC)    Critical lower limb ischemia ([place]) 02/28/2014   Critical limb ischemia    Diabetes mellitus    Elevated troponin 04/11/2016   GERD (gastroesophageal reflux disease)    History of stroke June 2013 03/14/2012   HOH (hard of hearing)    Hypertension    Hypothyroidism    Nonhealing skin ulcer ([place]) 10/28/2014   Obstructive sleep apnea    Orthostatic hypotension    Osteomyelitis (HCC)    left great toe   Pain in the chest 05/17/2016   Peripheral arterial disease ([place]), s/p PTA x 2 RLE    nonhealing ulcers bilaterally on each great toe   Pneumonia    PONV (postoperative nausea and vomiting)    Pulmonary nodule 05/17/2016   Restless  leg syndrome    Shortness of breath    Stroke [facility]) March 08, 2012   Toe osteomyelitis, left [facility])    Ulcer of great toe, left, with necrosis of bone ([place])    Wears dentures    Patient Active Problem List   Diagnosis Date Noted   Hypokalemia 05/18/2021   Muscular deconditioning 05/12/2021   Frequent falls 05/12/2021   Fatigue 04/29/2021   Recent unexplained weight loss 04/23/2021   Left shoulder pain 04/23/2021   Chest tightness 04/23/2021   Deep tissue injury 04/23/2021   Myoclonus 04/15/2021   Pneumonia 04/03/2021   PNA (pneumonia) 04/02/2021   Abnormal finding on urinalysis 04/01/2021   Syncope 02/16/2021   Minor head injury    Right hip pain 02/11/2021   Erectile dysfunction 01/22/2021   Gait abnormality 09/30/2020   Mild non proliferative diabetic retinopathy ([place]) 07/16/2020   Need for immunization against influenza 06/03/2020   S/P left knee arthroscopy 05/06/20 05/13/2020   Abnormal MRI 03/26/2020   Joint disorder of knee 03/26/2020   Fall at home, initial encounter 03/13/2020   Anemia 09/03/2019   Reflux esophagitis 09/03/2019   Esophageal dysphagia 05/30/2019   Constipation 05/30/2019   Positive colorectal cancer screening using Cologuard test 05/30/2019   Diabetic foot ulcer ([place]) 09/06/2018   Orthostasis 08/25/2016   Congestive heart failure ([place]) 05/17/2016   Bilateral carotid artery disease ([place])  07/08/2014   Obstructive sleep apnea 02/12/2014   Restless legs 10/23/2013   Overweight with body mass index (BMI) of 29 to 29.9 in adult 10/23/2013   Hyperlipidemia 07/16/2013   Peripheral arterial disease ([place]) 07/16/2013   Hypertension 03/14/2012   Type 2 diabetes mellitus with circulatory disorder ([place]) 03/14/2012   Peripheral neuropathy 03/14/2012   Cardiomyopathy- EF NL 01/2013, now 30-35% echo 03/14/2012   Past Surgical History:  Procedure Laterality Date   AMPUTATION TOE Left 07/25/2018   Procedure: AMPUTATION TOE INTERPHALANGEAL HALLUX LEFT;  Surgeon:  Evelina Bucy, DPM;  Location: [place];  Service: Podiatry;  Laterality: Left;   ANGIOPLASTY  02/28/14   diamond back orbital rotational atherectomy of Rt. tibial   BACK SURGERY     BONE BIOPSY Left 07/25/2018   Procedure: SUPERFICIAL BONE BIOPSY;  Surgeon: Evelina Bucy, DPM;  Location: [place];  Service: Podiatry;  Laterality: Left;   CARDIAC CATHETERIZATION N/A 05/19/2016   Procedure: Right/Left Heart Cath and Coronary Angiography;  Surgeon: Belva Crome, MD;  Location: [facility];  Service: Cardiovascular;  Laterality: N/A;   CERVICAL FUSION     ENDARTERECTOMY Left 11/29/2013   Procedure: ENDARTERECTOMY CAROTID;  Surgeon: Serafina Mitchell, MD;  Location: [place];  Service: Vascular;  Laterality: Left;   ESOPHAGOGASTRODUODENOSCOPY  02/2010   Dr. Gala Romney: patient presented with food impaction, schatzki ring with superimposed component of stricture with erosive reflux esophagitis, s/p disimpaction but dilation planned at later date    ESOPHAGOGASTRODUODENOSCOPY (EGD) WITH PROPOFOL N/A 08/13/2019   Dr. Gala Romney: Erosive reflux esophagitis with mild stricture and incidental Mallory-Weiss tear which precluded esophageal dilation.  Medium sized hiatal hernia.   ESOPHAGOGASTRODUODENOSCOPY (EGD) WITH PROPOFOL N/A 10/11/2019   Dr. Gala Romney: Esophageal stenosis status post dilation, moderate hiatal hernia   EYE SURGERY     FLEXIBLE SIGMOIDOSCOPY N/A 08/13/2019   Procedure: FLEXIBLE SIGMOIDOSCOPY;  Surgeon: [name], MD;  [facility];  Service: Endoscopy;  Laterality: N/A;  colonoscopy aboerted due to formed stool and poor prep   FOOT SURGERY     KNEE ARTHROSCOPY WITH MEDIAL MENISECTOMY Left 05/06/2020   Procedure: KNEE ARTHROSCOPY WITH MEDIAL MENISCECTOMY AND LATERAL MENISCECTOMY;  Surgeon: [name], MD;  Location: [facility];  Service: Orthopedics;  Laterality: Left;   Lower ext duplex doppler  03/14/14   Rt ABI 1.2   LOWER EXTREMITY ANGIOGRAM Bilateral 02/18/2014   Procedure:  LOWER EXTREMITY ANGIOGRAM;  Surgeon: [name], MD;  Location: [facility];  Service: Cardiovascular;  Laterality: Bilateral;   LOWER EXTREMITY ANGIOGRAM N/A 10/31/2014   Procedure: LOWER EXTREMITY ANGIOGRAM;  Surgeon: [name], MD;  Location: [facility] CATH LAB;  Service: Cardiovascular;  Laterality: N/A;   MALONEY DILATION N/A 10/11/2019   Procedure: Venia Minks DILATION;  Surgeon: [name], MD;  [facility];  Service: Endoscopy;  Laterality: N/A;   MULTIPLE TOOTH EXTRACTIONS     PV angiogram  02/18/2014   tibial vessel diseas bil.   SPINE SURGERY     tendon achillies lengthing and sesamoid     Current Outpatient Medications on File Prior to Visit  Medication Sig Dispense Refill   aspirin EC 81 MG EC tablet Take 1 tablet (81 mg total) by mouth daily.     b complex vitamins tablet Take 1 tablet by mouth daily.     carvedilol (COREG) 3.125 MG tablet Take 0.5 tablets (1.5625 mg total) by mouth 2 (two) times daily with a meal. 180  tablet 3   Cholecalciferol (VITAMIN D3) 5000 units TABS Take 5,000 Units by mouth 2 (two) times daily.      clopidogrel (PLAVIX) 75 MG tablet TAKE ONE (1) TABLET BY MOUTH EVERY DAY 90 tablet 0   Continuous Blood Gluc Receiver (FREESTYLE LIBRE 14 DAY READER) DEVI      Continuous Blood Gluc Sensor (FREESTYLE LIBRE 14 DAY SENSOR) MISC Inject 1 each into the skin every 14 (fourteen) days. 2 each 5   Cyanocobalamin 1500 MCG TBDP Take 1,500 mcg by mouth daily.      DULoxetine (CYMBALTA) 60 MG capsule Take 1 capsule (60 mg total) by mouth daily. 90 capsule 1   empagliflozin (JARDIANCE) 25 MG TABS tablet Take 1 tablet (25 mg total) by mouth daily before breakfast. 90 tablet 1   furosemide (LASIX) 40 MG tablet Take 1 tablet (40 mg total) by mouth daily.     Gabapentin Enacarbil (HORIZANT) 600 MG TBCR Horizant ER 600 mg tablet,extended release  Take 1 tablet every day by oral route at dinner.     glimepiride (AMARYL) 4 MG tablet Take 4 mg by mouth every  morning.     levofloxacin (LEVAQUIN) 500 MG tablet Take 1 tablet (500 mg total) by mouth daily. 7 tablet 0   levothyroxine (SYNTHROID) 88 MCG tablet Take 1 tablet (88 mcg total) by mouth daily. 90 tablet 3   linagliptin (TRADJENTA) 5 MG TABS tablet Take 1 tablet (5 mg total) by mouth daily. 30 tablet    Multiple Vitamin (MULTIVITAMIN WITH MINERALS) TABS tablet Take 1 tablet by mouth daily.     nitroGLYCERIN (NITROSTAT) 0.4 MG SL tablet Place 1 tablet (0.4 mg total) under the tongue every 5 (five) minutes as needed for chest pain. 25 tablet 3   pantoprazole (PROTONIX) 40 MG tablet TAKE ONE (1) TABLET BY MOUTH EVERY DAY 30 tablet 11   potassium chloride (KLOR-CON) 10 MEQ tablet TAKE ONE TABLET (10MEQ TOTAL) BY MOUTH DAILY 90 tablet 3   pramipexole (MIRAPEX) 0.25 MG tablet pramipexole 0.25 mg tablet     rOPINIRole (REQUIP) 0.5 MG tablet ropinirole 0.5 mg tablet     rosuvastatin (CRESTOR) 20 MG tablet Take 1 tablet (20 mg total) by mouth daily. 90 tablet 1   UNABLE TO FIND Home health supplies for CPAP 1 Product 0   UNABLE TO FIND [place] sock aid to help with support hose. Size XL.  Dx:I73.9, G25.81, R60.9 1 each 0   No current facility-administered medications on file prior to visit.    Allergies  Allergen Reactions   Codeine Nausea And Vomiting   Tramadol Nausea Only   Social History   Occupational History   Occupation: Firefighter   Tobacco Use   Smoking status: Former    Types: Pipe    Quit date: 07/08/1977    Years since quitting: 44.2   Smokeless tobacco: Never  Vaping Use   Vaping Use: Never used  Substance and Sexual Activity   Alcohol use: No    Alcohol/week: 0.0 standard drinks   Drug use: No   Sexual activity: Not on file   Family History  Problem Relation Age of Onset   Heart disease Mother    Hypertension Mother    Heart attack Mother    Hypertension Father    Diabetes Father    Diabetes Son    Heart disease Son    Hypertension Son    Colon cancer Neg Hx     Immunization History  Administered Date(s) Administered  Fluad Quad(high Dose 65+) 06/03/2020   Moderna Sars-Covid-2 Vaccination 11/07/2019, 12/05/2019, 08/21/2020   PFIZER(Purple Top)SARS-COV-2 Vaccination 08/21/2020   Pneumococcal Conjugate-13 07/30/2020   Pneumococcal Polysaccharide-23 03/10/2012     Review of Systems: Negative except as noted in the HPI.   Objective: There were no vitals filed for this visit.  [name] is a pleasant 76 y.o. male in NAD. AAO X 3.  Vascular Examination: CFT <3 seconds b/l LE. Faintly palpable DP pulses b/l. Nonpalpable PT pulses b/l. Pedal hair absent b/l. Skin temperature gradient WNL b/l. No pain with calf compression b/l. No edema b/l LE. No cyanosis or clubbing noted b/l LE.  Dermatological Examination: Pedal skin is warm and supple b/l LE. No interdigital macerations noted b/l LE. Toenail(s) 1-5 right, L 2nd toe, L 3rd toe, L 4th toe, and L 5th toe elongated, discolored, dystrophic, thickened >1/4 inch. Nails are crumbly with subungual debris and there is exquisite tenderness to dorsal palpation. No subungual wound(s) noted.  No images are attached to the encounter.  Wound Location: submet head 1 right foot with hemorrhagic hyperkeratotic roof. There is a minimal amount of devitalized tissue present in the wound. Predebridement Wound Measurement:  1.5  x 1.5 cm. Postdebridement Wound Measurement: 0.3 x 0.2 x 0.1cm. Wound Base: macerated tissue Peri-wound: Normal Exudate: None: wound tissue dry Blood Loss during debridement: 0 cc('s). Material in wound which inhibits healing/promotes adjacent tissue breakdown:  macerated hyperkeratosis. Description of tissue removed from ulceration today:  macerated hyperkeratosis. Sign(s) of clinical bacterial infection: no clinical signs of infection noted on examination today.   Musculoskeletal Examination: Muscle strength 5/5 to all LE muscle groups of right lower extremity. Lower extremity  amputation(s): partial amputation of L hallux.  Footwear Assessment: Does the patient wear appropriate shoes? Yes. Does the patient need inserts/orthotics? Yes.  Neurological Examination: Protective sensation diminished with 10g monofilament b/l. Vibratory sensation diminished b/l.  Hemoglobin A1C Latest Ref Rng & Units 01/26/2021 09/25/2020  HGBA1C 4.8 - 5.6 % 9.6(H) 7.8(H)  Some recent data might be hidden   Assessment: 1. Onychomycosis of multiple toenails with type 2 diabetes mellitus and peripheral neuropathy ([place])   2. Diabetic ulcer of right midfoot associated with type 2 diabetes mellitus, limited to breakdown of skin ([place])   3. Status post amputation of left great toe ([place])   4. Diabetic peripheral neuropathy associated with type 2 diabetes mellitus ([place])   5. Metatarsal deformity, right   6. Encounter for diabetic foot exam ([place])     ADA Risk Categorization:  High Risk  Patient has one or more of the following: Loss of protective sensation Absent pedal pulses Severe Foot deformity History of foot ulcer  Plan: -Patient was evaluated and treated and all questions answered.  -Diabetic foot examination performed today. -Mycotic toenails 1-5 right, L 2nd toe, L 3rd toe, L 4th toe, and L 5th toe were debrided in length and girth with sterile nail nippers and dremel without iatrogenic bleeding. -Patient/POA/Family member educated on diagnosis and treatment plan of routine ulcer debridement/wound care.  -Ulceration debridement achieved utilizing sharp excisional debridement with sterile scalpel blade.. Type/amount of devitalized tissue removed: macerated hyperkeratosis to healthy tissue. -Today's ulcer size post-debridement: 0.3 x 0.2 x 0.1 cm. -Ulceration cleansed with wound cleanser. Iodosorb Gel applied to base of ulceration and secured with light dressing. -Wound responded well to today's debridement. -Patient risk factors affecting healing of ulcer: uncontrolled diabetes,  diabetic neuropathy, PAD, history of prior amputation, foot deformity, dyslipidemia -[name]  given written instructions on daily wound care for right foot ulceration. -Patient will be scheduled with Dr. March Rummage or Dr. Posey Pronto for follow up of right foot ulcer. -Frequency of debridements needed to achieve healing: weekly to biweekly.  Return in about 3 months (around 12/21/2021).  Marzetta Board, DPM

## 2021-09-22 NOTE — Telephone Encounter (Signed)
Dawn from [facility] called on pt behalf   Arrie Aran is looking for the MD overseeing NP Pearline Cables in regard to pt diabetic shoes  Callback #  [phone] Dawn , [facility]

## 2021-09-23 ENCOUNTER — Ambulatory Visit (HOSPITAL_COMMUNITY)
Admission: RE | Admit: 2021-09-23 | Discharge: 2021-09-23 | Disposition: A | Discharge status: Home / Self Care | Payer: HMO | Source: Ambulatory Visit | Attending: Internal Medicine | Admitting: Internal Medicine

## 2021-09-23 ENCOUNTER — Other Ambulatory Visit (HOSPITAL_COMMUNITY): Payer: Self-pay | Admitting: Cardiovascular Disease

## 2021-09-23 DIAGNOSIS — I6522 Occlusion and stenosis of left carotid artery: Secondary | ICD-10-CM

## 2021-09-23 DIAGNOSIS — I6523 Occlusion and stenosis of bilateral carotid arteries: Secondary | ICD-10-CM

## 2021-09-23 DIAGNOSIS — Z9889 Other specified postprocedural states: Secondary | ICD-10-CM | POA: Diagnosis not present

## 2021-09-24 ENCOUNTER — Telehealth: Payer: Self-pay | Admitting: Nurse Practitioner

## 2021-09-24 NOTE — Telephone Encounter (Signed)
Pt called I for authorization for diabetic shoes ,  Can call pt and discuss

## 2021-09-25 ENCOUNTER — Ambulatory Visit: Payer: HMO | Admitting: Podiatry

## 2021-09-25 ENCOUNTER — Other Ambulatory Visit: Payer: Self-pay

## 2021-09-25 DIAGNOSIS — E1159 Type 2 diabetes mellitus with other circulatory complications: Secondary | ICD-10-CM

## 2021-09-25 DIAGNOSIS — L97511 Non-pressure chronic ulcer of other part of right foot limited to breakdown of skin: Secondary | ICD-10-CM | POA: Diagnosis not present

## 2021-09-25 NOTE — Telephone Encounter (Signed)
Pt will need in office visit to have prescription sent in for diabetic shoes please schedule with Kristin Bruins if available

## 2021-09-29 ENCOUNTER — Encounter: Payer: HMO | Attending: Physical Medicine & Rehabilitation | Admitting: Physical Medicine & Rehabilitation

## 2021-09-29 ENCOUNTER — Encounter: Payer: Self-pay | Admitting: Physical Medicine & Rehabilitation

## 2021-09-29 ENCOUNTER — Other Ambulatory Visit: Payer: Self-pay

## 2021-09-29 VITALS — BP 104/68 | HR 74 | Ht 72.0 in | Wt 204.0 lb

## 2021-09-29 DIAGNOSIS — G8110 Spastic hemiplegia affecting unspecified side: Secondary | ICD-10-CM | POA: Insufficient documentation

## 2021-09-29 DIAGNOSIS — M4802 Spinal stenosis, cervical region: Secondary | ICD-10-CM | POA: Insufficient documentation

## 2021-09-29 NOTE — Telephone Encounter (Signed)
Forms were faxed for [name] to fill out

## 2021-09-29 NOTE — Patient Instructions (Signed)
Will refer you to Dr Ellene Route to check your neck  Will do Botox injection nexst visit

## 2021-09-29 NOTE — Progress Notes (Signed)
Subjective:    Patient ID: [name], male    DOB: Feb 07, 1946, 76 y.o.   MRN: [id number]  HPI 76 year old male with complicated past medical history who was referred by orthopedics for the evaluation of left-sided weakness and problems with gait.  The patient has had problems with balance and has fallen but states that his current weakness preceded any falls.  The patient had a right CVA causing chronic left hemiparesis around 10 years ago.  He is also had L4-5 fusion approximately 15 years ago and cervical fusion approximately 12 years ago. The patient indicates that he has no increasing numbness on the left side of his body no bowel or bladder dysfunction.  He has not noted any progressive upper extremity weakness although he still feels like his leg has been getting weaker.  He does not complain of any significant pain.  His sleep is good.  His walking tolerance is 20 minutes he climbs steps and he drives.  He uses a cane or a walker for ambulation today he is with a wheeled walker.  He is in bed 9 to 12 hours a day sits around 5 hours a day stands 1 to 3 hours and walks about 2 hours/day.  He states that he exercises 6 days a week mainly strength training Hx of Right subcortical infarctions in 2013, s/p L CEA Bilateral hip OA  L4-5 Instrumented fusion July 2008 C3-4 ACDF 03/19/2010, Dr Ellene Route Arterial dopplers Dec, 2022 essentially normal  Pain Inventory Average Pain 0 Pain Right Now 0 My pain is  weakness  In the last 24 hours, has pain interfered with the following? General activity 0 Relation with others 0 Enjoyment of life 0 What TIME of day is your pain at its worst? . Sleep (in general) Good  Pain is worse with:  na Pain improves with:  na Relief from Meds:  na  walk with assistance use a cane use a walker how many minutes can you walk? 20 ability to climb steps?  yes do you drive?  yes  Do you have any goals in this area?  yes  weakness trouble walking  New  pt  New pt    Family History  Problem Relation Age of Onset   Heart disease Mother    Hypertension Mother    Heart attack Mother    Hypertension Father    Diabetes Father    Diabetes Son    Heart disease Son    Hypertension Son    Colon cancer Neg Hx    Social History   Socioeconomic History   Marital status: Married    Spouse name: [name]    Number of children: 4   Years of education: college   Highest education level: Not on file  Occupational History   Occupation: Firefighter   Tobacco Use   Smoking status: Former    Types: Pipe    Quit date: 07/08/1977    Years since quitting: 44.2   Smokeless tobacco: Never  Vaping Use   Vaping Use: Never used  Substance and Sexual Activity   Alcohol use: No    Alcohol/week: 0.0 standard drinks   Drug use: No   Sexual activity: Not on file  Other Topics Concern   Not on file  Social History Narrative   Retired from medical -[employer]      Lives with [name]    Cat: Jazz      Enjoy: sleeping a lot  Diet: eats all food groups -chicken, steak-chopped, mostly veggie   Caffeine: coffee and soda "a lot"   Water: 3-4 cups daily      Wears seat belt   Does not use phone while driving    Smoke detectors at home    weapons at home        Social Determinants of Health   Financial Resource Strain: Not on file  Food Insecurity: No Food Insecurity   Worried About Charity fundraiser in the Last Year: Never true   Arboriculturist in the Last Year: Never true  Transportation Needs: No Transportation Needs   Lack of Transportation (Medical): No   Lack of Transportation (Non-Medical): No  Physical Activity: Not on file  Stress: Not on file  Social Connections: Moderately Isolated   Frequency of Communication with Friends and Family: Three times a week   Frequency of Social Gatherings with Friends and Family: Three times a week   Attends Religious Services: Never   Active Member of Clubs or Organizations:  No   Attends Archivist Meetings: Never   Marital Status: Married   Past Surgical History:  Procedure Laterality Date   AMPUTATION TOE Left 07/25/2018   Procedure: AMPUTATION TOE INTERPHALANGEAL HALLUX LEFT;  Surgeon: Evelina Bucy, DPM;  Location: [place];  Service: Podiatry;  Laterality: Left;   ANGIOPLASTY  02/28/14   diamond back orbital rotational atherectomy of Rt. tibial   BACK SURGERY     BONE BIOPSY Left 07/25/2018   Procedure: SUPERFICIAL BONE BIOPSY;  Surgeon: Evelina Bucy, DPM;  Location: [place];  Service: Podiatry;  Laterality: Left;   CARDIAC CATHETERIZATION N/A 05/19/2016   Procedure: Right/Left Heart Cath and Coronary Angiography;  Surgeon: Belva Crome, MD;  Location: [place] [facility];  Service: Cardiovascular;  Laterality: N/A;   CERVICAL FUSION     ENDARTERECTOMY Left 11/29/2013   Procedure: ENDARTERECTOMY CAROTID;  Surgeon: Serafina Mitchell, MD;  Location: [place];  Service: Vascular;  Laterality: Left;   ESOPHAGOGASTRODUODENOSCOPY  02/2010   Dr. Gala Romney: patient presented with food impaction, schatzki ring with superimposed component of stricture with erosive reflux esophagitis, s/p disimpaction but dilation planned at later date    ESOPHAGOGASTRODUODENOSCOPY (EGD) WITH PROPOFOL N/A 08/13/2019   Dr. Gala Romney: Erosive reflux esophagitis with mild stricture and incidental Mallory-Weiss tear which precluded esophageal dilation.  Medium sized hiatal hernia.   ESOPHAGOGASTRODUODENOSCOPY (EGD) WITH PROPOFOL N/A 10/11/2019   Dr. Gala Romney: Esophageal stenosis status post dilation, moderate hiatal hernia   EYE SURGERY     FLEXIBLE SIGMOIDOSCOPY N/A 08/13/2019   Procedure: FLEXIBLE SIGMOIDOSCOPY;  Surgeon: [name], MD;  [facility];  Service: Endoscopy;  Laterality: N/A;  colonoscopy aboerted due to formed stool and poor prep   FOOT SURGERY     KNEE ARTHROSCOPY WITH MEDIAL MENISECTOMY Left 05/06/2020   Procedure: KNEE ARTHROSCOPY WITH MEDIAL MENISCECTOMY AND  LATERAL MENISCECTOMY;  Surgeon: [name], MD;  Location: [facility];  Service: Orthopedics;  Laterality: Left;   Lower ext duplex doppler  03/14/14   Rt ABI 1.2   LOWER EXTREMITY ANGIOGRAM Bilateral 02/18/2014   Procedure: LOWER EXTREMITY ANGIOGRAM;  Surgeon: [name], MD;  Location: [facility];  Service: Cardiovascular;  Laterality: Bilateral;   LOWER EXTREMITY ANGIOGRAM N/A 10/31/2014   Procedure: LOWER EXTREMITY ANGIOGRAM;  Surgeon: [name], MD;  Location: [facility] C[facility];  Service: Cardiovascular;  Laterality: N/A;   MALONEY DILATION N/A 10/11/2019  Procedure: MALONEY DILATION;  Surgeon: [name], MD;  [facility];  Service: Endoscopy;  Laterality: N/A;   MULTIPLE TOOTH EXTRACTIONS     PV angiogram  02/18/2014   tibial vessel diseas bil.   SPINE SURGERY     tendon achillies lengthing and sesamoid     Past Medical History:  Diagnosis Date   Aftercare following surgery of the circulatory system, NEC 12/04/2013   Angina decubitus ([place]) 05/19/2016   Arrhythmia 05/20/2016   Arthritis    ARTHRITIS, RIGHT FOOT 06/26/2008   Qualifier: Diagnosis of  By: Aline Brochure MD, [name]     Cardiomyopathy- EF NL 01/2013, now 30-35% echo 03/14/2012   Carotid artery occlusion    left s/p CEA   Cellulitis of left foot 05/07/2018   CHF (congestive heart failure) (HCC)    Critical lower limb ischemia ([place]) 02/28/2014   Critical limb ischemia    Diabetes mellitus    Elevated troponin 04/11/2016   GERD (gastroesophageal reflux disease)    History of stroke June 2013 03/14/2012   HOH (hard of hearing)    Hypertension    Hypothyroidism    Nonhealing skin ulcer ([place]) 10/28/2014   Obstructive sleep apnea    Orthostatic hypotension    Osteomyelitis (HCC)    left great toe   Pain in the chest 05/17/2016   Peripheral arterial disease ([place]), s/p PTA x 2 RLE    nonhealing ulcers bilaterally on each great toe   Pneumonia    PONV (postoperative nausea and vomiting)    Pulmonary nodule  05/17/2016   Restless leg syndrome    Shortness of breath    Stroke [facility]) March 08, 2012   Toe osteomyelitis, left [facility])    Ulcer of great toe, left, with necrosis of bone (HCC)    Wears dentures    BP 104/68    Pulse 74    Ht 6' (1.829 m)    Wt 204 lb (92.5 kg)    SpO2 98%    BMI 27.67 kg/m   Opioid Risk Score:   Fall Risk Score:  `1  Depression screen PHQ 2/9  Depression screen [facility] 2/9 09/29/2021 05/18/2021 05/12/2021 04/29/2021 04/23/2021 04/01/2021 03/18/2021  Decreased Interest 0 0 0 0 0 0 0  Down, Depressed, Hopeless 0 0 0 0 0 0 0  PHQ - 2 Score 0 0 0 0 0 0 0  Altered sleeping 3 - - - - - -  Tired, decreased energy 0 - - - - - -  Change in appetite 0 - - - - - -  Feeling bad or failure about yourself  0 - - - - - -  Trouble concentrating 0 - - - - - -  Moving slowly or fidgety/restless 3 - - - - - -  Suicidal thoughts 0 - - - - - -  PHQ-9 Score 6 - - - - - -  Difficult doing work/chores Very difficult - - - - - -  Some recent data might be hidden     Review of Systems  Constitutional:  Positive for unexpected weight change.  Musculoskeletal:  Positive for gait problem.  Neurological:  Positive for weakness.      Objective:   Physical Exam Vitals and nursing note reviewed.  Constitutional:      Appearance: He is normal weight.  HENT:     Head: Normocephalic and atraumatic.  Eyes:     Extraocular Movements: Extraocular movements intact.     Conjunctiva/sclera: Conjunctivae  normal.     Pupils: Pupils are equal, round, and reactive to light.  Pulmonary:     Effort: Pulmonary effort is normal.     Breath sounds: No stridor.  Musculoskeletal:     Comments: Patient has normal range of motion in his upper extremities.  Has mildly diminished hip internal rotation bilaterally without pain during range of motion.  There is no evidence of knee effusion bilaterally there is pedal edema left foot. Left dorsalis pedis and posterior tibialis pulses are normal.   Skin:    General:  Skin is warm and dry.  Neurological:     Mental Status: He is alert and oriented to person, place, and time.     Cranial Nerves: No dysarthria.     Motor: Abnormal muscle tone present.     Coordination: Coordination abnormal.     Gait: Gait abnormal.     Comments: Motor strength is 5/5 in the right deltoid, bicep, tricep, grip, hip flexor, knee extensor, ankle dorsiflexor Left lower extremity is 4/5 in hip flexor knee extensor ankle dorsiflexor left upper extremity 4+ in the deltoid bicep tricep grip Sensation equal light touch sensation bilateral upper and lower limbs Tone is mildly increased at the left hip adductor's MAS 2 No evidence of clonus at the ankle no clonus at the finger flexors.  Psychiatric:        Mood and Affect: Mood normal.        Behavior: Behavior normal.          Assessment & Plan:  61.  76 year old male with history of remote CVA causing chronic left-sided weakness who has had worsening gait over the last year.  The patient has had multiple imaging studies in 2022 including CT of the head showing bilateral chronic basal ganglia infarcts, CT of the cervical spine showing OPLL left-sided causing spinal stenosis on the left side at C4-C5 In 2021 had normal thoracic spine MRI and a lumbar MRI showing multilevel degenerative disc with foraminal stenosis primarily on the left side at L1 to moderate right foraminal stenosis at L2-3. The patient has a history of C3-4 ACDF with Dr. Ellene Route we will asked Dr. Ellene Route to reevaluate the posterior longitudinal ligament ossification and see whether this may be causing cervical myelopathy which may be causing a decline in his mobility. MRI and CT scans were reviewed as well as hip x-rays as part of this evaluation. 2.  Spasticity likely due to CVA although cannot rule out cervical myelopathy.  This primarily affecting hip adductor's we will set him up for botulinum toxin injection for this. 3.  We will pursue left lower extremity EMG  after above. Discussed with patient agrees with plan

## 2021-09-29 NOTE — Telephone Encounter (Signed)
LVM for dawn to call the office back

## 2021-09-30 ENCOUNTER — Encounter: Payer: Self-pay | Admitting: Podiatry

## 2021-09-30 ENCOUNTER — Telehealth: Payer: Self-pay | Admitting: *Deleted

## 2021-09-30 NOTE — Progress Notes (Signed)
Subjective:  Patient ID: [name], male    DOB: 1945-11-24,  MRN: [id number]  Chief Complaint  Patient presents with   Diabetes    Right foot callus     76 y.o. male presents for wound care.  Patient presents with complaint of right first metatarsophalangeal joint ulcer with limited to the breakdown of the skin.  Patient states that it was a callus before they been tried offloading but has progressed to gotten worse.  He states no one has been looking at it.  He wanted to get it evaluated.  He was seen by Dr. Adah Perl earlier in the month and at that time there was callus formation but wanted to get evaluated.  He has had his diabetic shoes with insoles.  He also wanted know if he could be referred to a wound care center right away.  He would like to go to the one in any pain.   Review of Systems: Negative except as noted in the HPI. Denies N/V/F/Ch.  Past Medical History:  Diagnosis Date   Aftercare following surgery of the circulatory system, NEC 12/04/2013   Angina decubitus ([place]) 05/19/2016   Arrhythmia 05/20/2016   Arthritis    ARTHRITIS, RIGHT FOOT 06/26/2008   Qualifier: Diagnosis of  By: Aline Brochure MD, [name]     Cardiomyopathy- EF NL 01/2013, now 30-35% echo 03/14/2012   Carotid artery occlusion    left s/p CEA   Cellulitis of left foot 05/07/2018   CHF (congestive heart failure) (HCC)    Critical lower limb ischemia ([place]) 02/28/2014   Critical limb ischemia    Diabetes mellitus    Elevated troponin 04/11/2016   GERD (gastroesophageal reflux disease)    History of stroke June 2013 03/14/2012   HOH (hard of hearing)    Hypertension    Hypothyroidism    Nonhealing skin ulcer ([place]) 10/28/2014   Obstructive sleep apnea    Orthostatic hypotension    Osteomyelitis (HCC)    left great toe   Pain in the chest 05/17/2016   Peripheral arterial disease ([place]), s/p PTA x 2 RLE    nonhealing ulcers bilaterally on each great toe   Pneumonia    PONV (postoperative nausea and vomiting)     Pulmonary nodule 05/17/2016   Restless leg syndrome    Shortness of breath    Stroke [facility]) March 08, 2012   Toe osteomyelitis, left [facility])    Ulcer of great toe, left, with necrosis of bone (HCC)    Wears dentures     Current Outpatient Medications:    aspirin EC 81 MG EC tablet, Take 1 tablet (81 mg total) by mouth daily., Disp: , Rfl:    b complex vitamins tablet, Take 1 tablet by mouth daily., Disp: , Rfl:    carvedilol (COREG) 3.125 MG tablet, Take 0.5 tablets (1.5625 mg total) by mouth 2 (two) times daily with a meal., Disp: 180 tablet, Rfl: 3   Cholecalciferol (VITAMIN D3) 5000 units TABS, Take 5,000 Units by mouth 2 (two) times daily. , Disp: , Rfl:    clopidogrel (PLAVIX) 75 MG tablet, TAKE ONE (1) TABLET BY MOUTH EVERY DAY, Disp: 90 tablet, Rfl: 0   Continuous Blood Gluc Receiver (FREESTYLE LIBRE 14 DAY READER) DEVI, , Disp: , Rfl:    Continuous Blood Gluc Sensor (FREESTYLE LIBRE 14 DAY SENSOR) MISC, Inject 1 each into the skin every 14 (fourteen) days., Disp: 2 each, Rfl: 5   Cyanocobalamin 1500 MCG TBDP, Take 1,500 mcg by mouth  daily. , Disp: , Rfl:    furosemide (LASIX) 40 MG tablet, Take 1 tablet (40 mg total) by mouth daily., Disp: , Rfl:    glimepiride (AMARYL) 4 MG tablet, Take 4 mg by mouth every morning., Disp: , Rfl:    levothyroxine (SYNTHROID) 88 MCG tablet, Take 1 tablet (88 mcg total) by mouth daily., Disp: 90 tablet, Rfl: 3   Multiple Vitamin (MULTIVITAMIN WITH MINERALS) TABS tablet, Take 1 tablet by mouth daily., Disp: , Rfl:    nitroGLYCERIN (NITROSTAT) 0.4 MG SL tablet, Place 1 tablet (0.4 mg total) under the tongue every 5 (five) minutes as needed for chest pain., Disp: 25 tablet, Rfl: 3   pantoprazole (PROTONIX) 40 MG tablet, TAKE ONE (1) TABLET BY MOUTH EVERY DAY, Disp: 30 tablet, Rfl: 11   potassium chloride (KLOR-CON) 10 MEQ tablet, TAKE ONE TABLET (10MEQ TOTAL) BY MOUTH DAILY, Disp: 90 tablet, Rfl: 3   rOPINIRole (REQUIP) 0.5 MG tablet, ropinirole 0.5 mg  tablet, Disp: , Rfl:    rosuvastatin (CRESTOR) 20 MG tablet, Take 1 tablet (20 mg total) by mouth daily., Disp: 90 tablet, Rfl: 1   UNABLE TO FIND, Home health supplies for CPAP, Disp: 1 Product, Rfl: 0   UNABLE TO FIND, Butler sock aid to help with support hose. Size XL.  Dx:I73.9, G25.81, R60.9, Disp: 1 each, Rfl: 0  Social History   Tobacco Use  Smoking Status Former   Types: Pipe   Quit date: 07/08/1977   Years since quitting: 44.2  Smokeless Tobacco Never    Allergies  Allergen Reactions   Codeine Nausea And Vomiting   Tramadol Nausea Only   Objective:  There were no vitals filed for this visit. There is no height or weight on file to calculate BMI. Constitutional Well developed. Well nourished.  Vascular Dorsalis pedis pulses palpable bilaterally. Posterior tibial pulses palpable bilaterally. Capillary refill normal to all digits.  No cyanosis or clubbing noted. Pedal hair growth normal.  Neurologic Normal speech. Oriented to person, place, and time. Protective sensation absent  Dermatologic Wound Location: Right first metatarsal phalangeal joint wound limited to the breakdown of the skin.  No probing noted the deep tissue.  No signs of infection noted.  No malodor present no purulent drainage noted Wound Base: Mixed Granular/Fibrotic Peri-wound: Calloused Exudate: None: wound tissue dry, Scant/small amount Serosanguinous exudate Wound Measurements: -See below  Orthopedic: No pain to palpation either foot.   Radiographs: None Assessment:   1. Right foot ulcer, limited to breakdown of skin ([place])   2. Type 2 diabetes mellitus with other circulatory complication, without long-term current use of insulin ([place])    Plan:  Patient was evaluated and treated and all questions answered.  Ulcer right first metatarsophalangeal joint wound limited to the breakdown of the skin -Debridement as below. -Dressed with Betadine wet-to-dry, DSD. -Continue off-loading with  surgical shoe. -Patient would like to be referred to any pain wound care center.  I will place the referral and have him schedule the appointment.  Procedure: Excisional Debridement of Wound Tool: Sharp chisel blade/tissue nipper Rationale: Removal of non-viable soft tissue from the wound to promote healing.  Anesthesia: none Pre-Debridement Wound Measurements: 0.5 cm x 0.6 cm x 0.2 cm  Post-Debridement Wound Measurements: 0.7 cm x 0.7 cm x 0.2 cm  Type of Debridement: Sharp Excisional Tissue Removed: Non-viable soft tissue Blood loss: Minimal (<50cc) Depth of Debridement: subcutaneous tissue. Technique: Sharp excisional debridement to bleeding, viable wound base.  Wound Progress: This is my initial  evaluation of continue to monitor the progression of the wound Site healing conversation 7 Dressing: Dry, sterile, compression dressing. Disposition: Patient tolerated procedure well. Patient to return in 1 week for follow-up.  No follow-ups on file.

## 2021-09-30 NOTE — Telephone Encounter (Signed)
Faxed and called the  referral to [facility] wound center, received confirmation -09/30/21.

## 2021-09-30 NOTE — Telephone Encounter (Signed)
-----   Message from Felipa Furnace, DPM sent at 09/30/2021  8:12 AM EST ----- Regarding: Referral to the wound care center at [place],   Can you refer this patient to the wound care center at [facility] patient wanted to get referred over there.  I did not see any hospital system for wound care center through [facility] health.  I am not sure if it outside of [place].  Let me know if any issues.  I did put an external wound care center order in.  Thanks  Boneta Lucks

## 2021-10-01 ENCOUNTER — Telehealth: Payer: Self-pay | Admitting: Nurse Practitioner

## 2021-10-01 ENCOUNTER — Telehealth: Payer: Self-pay

## 2021-10-01 NOTE — Telephone Encounter (Signed)
In office appt scheduled   LVM in regard to appt and time

## 2021-10-01 NOTE — Telephone Encounter (Signed)
Tele appt scheduled

## 2021-10-01 NOTE — Telephone Encounter (Signed)
Patient called stating he is a new patient and Dr. Letta Pate referred him to a neck doctor. He wants to know is it ok to go to the [facility] to workout in the meantime?

## 2021-10-02 NOTE — Telephone Encounter (Signed)
Patient notified

## 2021-10-05 ENCOUNTER — Other Ambulatory Visit: Payer: Self-pay

## 2021-10-05 ENCOUNTER — Ambulatory Visit (HOSPITAL_COMMUNITY): Payer: HMO | Attending: Family Medicine | Admitting: Physical Therapy

## 2021-10-05 ENCOUNTER — Encounter: Payer: Self-pay | Admitting: Nurse Practitioner

## 2021-10-05 ENCOUNTER — Ambulatory Visit (INDEPENDENT_AMBULATORY_CARE_PROVIDER_SITE_OTHER): Payer: HMO | Admitting: Nurse Practitioner

## 2021-10-05 VITALS — BP 126/84 | HR 72 | Ht 72.0 in | Wt 201.0 lb

## 2021-10-05 DIAGNOSIS — I10 Essential (primary) hypertension: Secondary | ICD-10-CM

## 2021-10-05 DIAGNOSIS — Z23 Encounter for immunization: Secondary | ICD-10-CM

## 2021-10-05 DIAGNOSIS — E039 Hypothyroidism, unspecified: Secondary | ICD-10-CM | POA: Insufficient documentation

## 2021-10-05 DIAGNOSIS — E1159 Type 2 diabetes mellitus with other circulatory complications: Secondary | ICD-10-CM | POA: Diagnosis not present

## 2021-10-05 DIAGNOSIS — E782 Mixed hyperlipidemia: Secondary | ICD-10-CM | POA: Diagnosis not present

## 2021-10-05 DIAGNOSIS — M79671 Pain in right foot: Secondary | ICD-10-CM | POA: Diagnosis present

## 2021-10-05 DIAGNOSIS — G629 Polyneuropathy, unspecified: Secondary | ICD-10-CM

## 2021-10-05 DIAGNOSIS — D539 Nutritional anemia, unspecified: Secondary | ICD-10-CM

## 2021-10-05 NOTE — Assessment & Plan Note (Addendum)
DASH diet and commitment to daily physical activity for a minimum of 30 minutes discussed and encouraged, as a part of hypertension management. The importance of attaining a healthy weight is also discussed.  BP/Weight 10/05/2021 09/29/2021 08/04/2021 05/18/2021 05/16/2021 05/12/2021 8/56/3149  Systolic BP 702 637 858 94 850 277 412  Diastolic BP 84 68 69 57 81 66 63  Wt. (Lbs) 201 204 191 191 191.6 191 -  BMI 27.26 27.67 25.9 25.9 25.99 25.9 -  well controlled  Continue current meds

## 2021-10-05 NOTE — Assessment & Plan Note (Signed)
Macrocytic anemia  Vitamin B12 and folate, CBC today,  Takes cyanocobalamin 1585mcg daily.

## 2021-10-05 NOTE — Therapy (Signed)
[place], [place], [address] Phone: [phone]   Fax:  [phone]  Wound Care Evaluation  Patient Details  Name: [name] MRN: [id number] Date of Birth: 1946/09/06 Referring Provider (Christopher): Boneta Lucks   Encounter Date: 10/05/2021   Christopher End of Session - 10/05/21 1536     Visit Number 1    Number of Visits 1    Authorization Type [employer], no VL, no auth ,    Christopher Start Time 1219    Christopher Stop Time 7588    Christopher Time Calculation (min) 40 min    Activity Tolerance Patient tolerated treatment well    Behavior During Therapy [facility] for tasks assessed/performed             Past Medical History:  Diagnosis Date   Aftercare following surgery of the circulatory system, NEC 12/04/2013   Angina decubitus ([place]) 05/19/2016   Arrhythmia 05/20/2016   Arthritis    ARTHRITIS, RIGHT FOOT 06/26/2008   Qualifier: Diagnosis of  By: Aline Brochure MD, [name]     Cardiomyopathy- EF NL 01/2013, now 30-35% echo 03/14/2012   Carotid artery occlusion    left s/p CEA   Cellulitis of left foot 05/07/2018   CHF (congestive heart failure) ([place])    Critical lower limb ischemia ([place]) 02/28/2014   Critical limb ischemia    Diabetes mellitus    Elevated troponin 04/11/2016   GERD (gastroesophageal reflux disease)    History of stroke June 2013 03/14/2012   HOH (hard of hearing)    Hypertension    Hypothyroidism    Nonhealing skin ulcer ([place]) 10/28/2014   Obstructive sleep apnea    Orthostatic hypotension    Osteomyelitis ([place])    left great toe   Pain in the chest 05/17/2016   Peripheral arterial disease ([place]), s/p PTA x 2 RLE    nonhealing ulcers bilaterally on each great toe   Pneumonia    PONV (postoperative nausea and vomiting)    Pulmonary nodule 05/17/2016   Restless leg syndrome    Shortness of breath    Stroke [facility]) March 08, 2012   Toe osteomyelitis, left [facility])    Ulcer of great toe, left, with necrosis of bone ([place])     Wears dentures     Past Surgical History:  Procedure Laterality Date   AMPUTATION TOE Left 07/25/2018   Procedure: AMPUTATION TOE INTERPHALANGEAL HALLUX LEFT;  Surgeon: Evelina Bucy, DPM;  Location: [place];  Service: Podiatry;  Laterality: Left;   ANGIOPLASTY  02/28/14   diamond back orbital rotational atherectomy of Rt. tibial   BACK SURGERY     BONE BIOPSY Left 07/25/2018   Procedure: SUPERFICIAL BONE BIOPSY;  Surgeon: Evelina Bucy, DPM;  Location: [place];  Service: Podiatry;  Laterality: Left;   CARDIAC CATHETERIZATION N/A 05/19/2016   Procedure: Right/Left Heart Cath and Coronary Angiography;  Surgeon: Belva Crome, MD;  Location: [place] CV LAB;  Service: Cardiovascular;  Laterality: N/A;   CERVICAL FUSION     ENDARTERECTOMY Left 11/29/2013   Procedure: ENDARTERECTOMY CAROTID;  Surgeon: Serafina Mitchell, MD;  [facility] OR;  Service: Vascular;  Laterality: Left;   ESOPHAGOGASTRODUODENOSCOPY  02/2010   Dr. Gala Romney: patient presented with food impaction, schatzki ring with superimposed component of stricture with erosive reflux esophagitis, s/p disimpaction but dilation planned at later date    ESOPHAGOGASTRODUODENOSCOPY (EGD) WITH PROPOFOL N/A 08/13/2019   Dr. Gala Romney: Erosive reflux esophagitis with mild  stricture and incidental Mallory-Weiss tear which precluded esophageal dilation.  Medium sized hiatal hernia.   ESOPHAGOGASTRODUODENOSCOPY (EGD) WITH PROPOFOL N/A 10/11/2019   Dr. Gala Romney: Esophageal stenosis status post dilation, moderate hiatal hernia   EYE SURGERY     FLEXIBLE SIGMOIDOSCOPY N/A 08/13/2019   Procedure: FLEXIBLE SIGMOIDOSCOPY;  Surgeon: [name], MD;  [facility];  Service: Endoscopy;  Laterality: N/A;  colonoscopy aboerted due to formed stool and poor prep   FOOT SURGERY     KNEE ARTHROSCOPY WITH MEDIAL MENISECTOMY Left 05/06/2020   Procedure: KNEE ARTHROSCOPY WITH MEDIAL MENISCECTOMY AND LATERAL MENISCECTOMY;  Surgeon: [name], MD;   Location: [facility] OR[facility];  Service: Orthopedics;  Laterality: Left;   Lower ext duplex doppler  03/14/14   Rt ABI 1.2   LOWER EXTREMITY ANGIOGRAM Bilateral 02/18/2014   Procedure: LOWER EXTREMITY ANGIOGRAM;  Surgeon: [name], MD;  Location: [facility] CATH LAB;  Service: Cardiovascular;  Laterality: Bilateral;   LOWER EXTREMITY ANGIOGRAM N/A 10/31/2014   Procedure: LOWER EXTREMITY ANGIOGRAM;  Surgeon: [name], MD;  Location: [facility] CATH LAB;  Service: Cardiovascular;  Laterality: N/A;   MALONEY DILATION N/A 10/11/2019   Procedure: Venia Minks DILATION;  Surgeon: [name], MD;  [facility];  Service: Endoscopy;  Laterality: N/A;   MULTIPLE TOOTH EXTRACTIONS     PV angiogram  02/18/2014   tibial vessel diseas bil.   SPINE SURGERY     tendon achillies lengthing and sesamoid      There were no vitals filed for this visit.     [facility] Christopher Assessment - 10/05/21 0001       Assessment   Medical Diagnosis R foot ulcer and gait instability    Referring Provider (Christopher) Boneta Lucks    Onset Date/Surgical Date --   chronic   Prior Therapy multiple times      Precautions   Precautions Fall      Balance Screen   Has the patient fallen in the past 6 months No    Has the patient had a decrease in activity level because of a fear of falling?  No    Is the patient reluctant to leave their home because of a fear of falling?  No             Wound Therapy - 10/05/21 1527     Subjective Christopher has no  pain at this time.  States at times when he walks it feels as if something is jabbing into his foot.    Date of Onset --   chronic   Pain Score 0-No pain    Selective Debridement - Location callus plantar met head region    Selective Debridement - Tools Used Forceps;Scissors    Selective Debridement - Tissue Removed callous devitalized tissue.    Wound Therapy - Clinical Statement [name] is a well known Christopher in this clinic.  He had a nonhealing wound which was covered in a callous on the  plantar aspect of his Rt foot which took months but eventually did heal.  He comes today stating that the "outer skin is not healed".  The therapist saw no opening.  There is a slight calloused area that the therapist debrided along with some dry skin, however no opening therefore no skilled care is needed at this time.    Wound Therapy - Functional Problem List difficulty walking, washing, dressing    Factors Delaying/Impairing Wound Healing Diabetes Mellitus;Immobility;Multiple medical problems;Vascular compromise  Wound Plan slight callous but no wound.  Educated Christopher that he would benefit from moisturizing area and applyin rounded out mole skin to decrease pressure.  There is not skilled therapy needed at this time and Christopher will be discharged from wound care.                   Objective measurements completed on examination: See above findings.             Christopher Education - 10/05/21 1531     Education Details Christopher instructed to before bedtime take a warm wash cloth and scub the bottom of his foot well prior to putting on a thick layer of lotion and don a clean sock.    Person(s) Educated Patient    Methods Explanation    Comprehension Verbalized understanding              Christopher Short Term Goals - 10/05/21 1532       Christopher SHORT TERM GOAL #1   Title Patient will be independent in self management strategies to decrease callous formation    Time 1    Period Days    Status Achieved    Target Date 10/05/21                            Patient will benefit from skilled therapeutic intervention in order to improve the following deficits and impairments:     Visit Diagnosis: Pain in right foot    Problem List Patient Active Problem List   Diagnosis Date Noted   Hypothyroidism 10/05/2021   Hypokalemia 05/18/2021   Muscular deconditioning 05/12/2021   Frequent falls 05/12/2021   Fatigue 04/29/2021   Recent unexplained weight loss 04/23/2021   Left  shoulder pain 04/23/2021   Chest tightness 04/23/2021   Deep tissue injury 04/23/2021   Myoclonus 04/15/2021   Pneumonia 04/03/2021   PNA (pneumonia) 04/02/2021   Abnormal finding on urinalysis 04/01/2021   Syncope 02/16/2021   Minor head injury    Right hip pain 02/11/2021   Erectile dysfunction 01/22/2021   Gait abnormality 09/30/2020   Mild non proliferative diabetic retinopathy ([place]) 07/16/2020   Need for immunization against influenza 06/03/2020   S/P left knee arthroscopy 05/06/20 05/13/2020   Abnormal MRI 03/26/2020   Joint disorder of knee 03/26/2020   Fall at home, initial encounter 03/13/2020   Anemia, deficiency 09/03/2019   Reflux esophagitis 09/03/2019   Esophageal dysphagia 05/30/2019   Constipation 05/30/2019   Positive colorectal cancer screening using Cologuard test 05/30/2019   Diabetic foot ulcer ([place]) 09/06/2018   Orthostasis 08/25/2016   Congestive heart failure ([place]) 05/17/2016   Bilateral carotid artery disease ([place]) 07/08/2014   Obstructive sleep apnea 02/12/2014   Restless legs 10/23/2013   Overweight with body mass index (BMI) of 29 to 29.9 in adult 10/23/2013   Hyperlipidemia 07/16/2013   Peripheral arterial disease ([place]) 07/16/2013   Hypertension 03/14/2012   Type 2 diabetes mellitus with circulatory disorder ([place]) 03/14/2012   Peripheral neuropathy 03/14/2012   Cardiomyopathy- EF NL 01/2013, now 30-35% echo 03/14/2012   [name], [name] [phone]  10/05/2021, 3:37 PM  [place] [address] [place], [place], [address] Phone: [phone]   Fax:  [phone]  Name: [name] MRN: [id number] Date of Birth: 09/22/1945

## 2021-10-05 NOTE — Assessment & Plan Note (Signed)
Check lipid panel today. Takes crestor 20mg  daily

## 2021-10-05 NOTE — Assessment & Plan Note (Addendum)
Has cramping of both ankles that started 3 months,  Mustard helps his cramps, will discuss with podiatry at his next visit.  EGFR today

## 2021-10-05 NOTE — Assessment & Plan Note (Signed)
Lab Results  Component Value Date   HGBA1C 9.6 (H) 01/26/2021  takes glimepiride 4mg  daily.Not on ACE/ARB,  A1C ordered today Foot exam done today, needs diabetic shoes replaced. Has forms from podiatry to be completed by MD

## 2021-10-05 NOTE — Progress Notes (Signed)
° °  [name]     MRN: [id number]      DOB: 1946/03/11   HPI [name] is here for follow up and re-evaluation of chronic medical conditions, medication management and review of any available recent lab and radiology data.  Preventive health is updated, specifically  Cancer screening and Immunization.   Questions or concerns regarding consultations or procedures which the PT has had in the interim are  addressed. The PT denies any adverse reactions to current medications since the last visit.  There are no new concerns.  There are no specific complaints    Pt needs a diabetic shoes, he has one at home but needs a replacement one.   Pt stated that he has not taken his BP meds today.  Pt c/o intermittent cramping  on both ankles that started 3 months ago, states it can be severe when he has it, takes mustard , mustard cuts the cramping.    ROS Denies recent fever or chills. Denies sinus pressure, nasal congestion, ear pain or sore throat. Denies chest congestion, productive cough or wheezing. Denies chest pains, palpitations and leg swelling Denies abdominal pain, nausea, vomiting,diarrhea or constipation.   Denies dysuria, frequency, hesitancy or incontinence. Denies joint pain, swelling and limitation in mobility. Denies headaches, seizures, numbness, or tingling. Denies depression, anxiety or insomnia. Denies skin break down or rash.   PE  BP 126/84 (BP Location: Right Arm, Cuff Size: Normal)    Pulse 72    Ht 6' (1.829 m)    Wt 201 lb (91.2 kg)    SpO2 96%    BMI 27.26 kg/m   Patient alert and oriented and in no cardiopulmonary distress.    Chest: Clear to auscultation bilaterally.  CVS: S1, S2 no murmurs, no S3.Regular rate.  ABD: Soft non tender.   Ext: No edema  MS: Adequate ROM spine, shoulders, hips and knees, uses walker   Skin: Intact, no ulcerations or rash noted.  Psych: Good eye contact, normal affect. Memory intact not anxious or depressed  appearing.  CNS: intact, power,  diminished sensation of lower extremities    Assessment & Plan

## 2021-10-05 NOTE — Progress Notes (Signed)
° °  [name]     MRN: [id number]      DOB: [birth year]   HPI [name] is here for follow up and re-evaluation of chronic medical conditions, medication management and review of any available recent lab and radiology data.  Preventive health is updated, specifically  Cancer screening and Immunization.   Questions or concerns regarding consultations or procedures which the PT has had in the interim are  addressed. The PT denies any adverse reactions to current medications since the last visit.    Pt needs a diabetic shoes, he has one at home but needs a replacement one.    Pt stated that he has not taken his BP meds today.   Pt c/o intermittent cramping  on both ankles that started 3 months ago, states it can be severe when he has it, takes mustard , mustard cuts the cramping.     Pt educated on the need tp get shingles, tdap and COVID booster vaccine he verbalized understanding.     ROS Denies recent fever or chills. Denies sinus pressure, nasal congestion, ear pain or sore throat. Denies chest congestion, productive cough or wheezing. Denies chest pains, palpitations and leg swelling Denies abdominal pain, nausea, vomiting,diarrhea or constipation.   Denies dysuria, frequency, hesitancy or incontinence. Denies joint pain, swelling and limitation in mobility. Denies headaches, seizures, numbness, or tingling. Denies depression, anxiety or insomnia.    PE  BP (!) 156/75 (BP Location: Right Arm, Patient Position: Sitting, Cuff Size: Normal)    Pulse 72    Ht 6' (1.829 m)    Wt 201 lb (91.2 kg)    SpO2 96%    BMI 27.26 kg/m   Patient alert and oriented and in no cardiopulmonary distress.  HEENT: No facial asymmetry, EOMI,     Neck supple .  Chest: Clear to auscultation bilaterally.  CVS: S1, S2 no murmurs, no S3.Regular rate.  ABD: Soft non tender.   Ext: No edema  MS: Adequate ROM spine, shoulders, hips and knees.uses a walker  Skin: Intact, no ulcerations or  rash noted.  Psych: Good eye contact, normal affect. Memory intact not anxious or depressed appearing.  CNS: power intact  normal throughout, has diminished sensation in both foot    Assessment & Plan

## 2021-10-05 NOTE — Patient Instructions (Signed)
Please get your fasting blood work done by tomorrow.   Please TDAP, shingles , covid booster from your pharmacy.   It is important that you exercise regularly at least 30 minutes 5 times a week.  Think about what you will eat, plan ahead. Choose " clean, green, fresh or frozen" over canned, processed or packaged foods which are more sugary, salty and fatty. 70 to 75% of food eaten should be vegetables and fruit. Three meals at set times with snacks allowed between meals, but they must be fruit or vegetables. Aim to eat over a 12 hour period , example 7 am to 7 pm, and STOP after  your last meal of the day. Drink water,generally about 64 ounces per day, no other drink is as healthy. Fruit juice is best enjoyed in a healthy way, by EATING the fruit.  Thanks for choosing [facility], we consider it a privelige to serve you.

## 2021-10-06 ENCOUNTER — Other Ambulatory Visit (HOSPITAL_COMMUNITY): Payer: Self-pay | Admitting: Cardiovascular Disease

## 2021-10-06 DIAGNOSIS — I739 Peripheral vascular disease, unspecified: Secondary | ICD-10-CM

## 2021-10-07 ENCOUNTER — Other Ambulatory Visit: Payer: Self-pay | Admitting: Nurse Practitioner

## 2021-10-07 ENCOUNTER — Ambulatory Visit (HOSPITAL_COMMUNITY): Payer: HMO

## 2021-10-07 DIAGNOSIS — D539 Nutritional anemia, unspecified: Secondary | ICD-10-CM

## 2021-10-07 LAB — CMP14+EGFR
ALT: 10 IU/L (ref 0–44)
AST: 15 IU/L (ref 0–40)
Albumin/Globulin Ratio: 1.7 (ref 1.2–2.2)
Albumin: 4.5 g/dL (ref 3.7–4.7)
Alkaline Phosphatase: 83 IU/L (ref 44–121)
BUN/Creatinine Ratio: 14 (ref 10–24)
BUN: 13 mg/dL (ref 8–27)
Bilirubin Total: 1 mg/dL (ref 0.0–1.2)
CO2: 27 mmol/L (ref 20–29)
Calcium: 9.1 mg/dL (ref 8.6–10.2)
Chloride: 98 mmol/L (ref 96–106)
Creatinine, Ser: 0.96 mg/dL (ref 0.76–1.27)
Globulin, Total: 2.6 g/dL (ref 1.5–4.5)
Glucose: 151 mg/dL — ABNORMAL HIGH (ref 70–99)
Potassium: 3.7 mmol/L (ref 3.5–5.2)
Sodium: 138 mmol/L (ref 134–144)
Total Protein: 7.1 g/dL (ref 6.0–8.5)
eGFR: 81 mL/min/{1.73_m2} (ref >59)

## 2021-10-07 LAB — CBC WITH DIFFERENTIAL/PLATELET
Basophils Absolute: 0 10*3/uL (ref 0.0–0.2)
Basos: 1 %
EOS (ABSOLUTE): 0.1 10*3/uL (ref 0.0–0.4)
Eos: 1 %
Hematocrit: 34.4 % — ABNORMAL LOW (ref 37.5–51.0)
Hemoglobin: 11.8 g/dL — ABNORMAL LOW (ref 13.0–17.7)
Immature Grans (Abs): 0 10*3/uL (ref 0.0–0.1)
Immature Granulocytes: 0 %
Lymphocytes Absolute: 0.8 10*3/uL (ref 0.7–3.1)
Lymphs: 17 %
MCH: 33.1 pg — ABNORMAL HIGH (ref 26.6–33.0)
MCHC: 34.3 g/dL (ref 31.5–35.7)
MCV: 96 fL (ref 79–97)
Monocytes Absolute: 0.5 10*3/uL (ref 0.1–0.9)
Monocytes: 11 %
Neutrophils Absolute: 3.2 10*3/uL (ref 1.4–7.0)
Neutrophils: 70 %
Platelets: 177 10*3/uL (ref 150–450)
RBC: 3.57 x10E6/uL — ABNORMAL LOW (ref 4.14–5.80)
RDW: 14.2 % (ref 11.6–15.4)
WBC: 4.5 10*3/uL (ref 3.4–10.8)

## 2021-10-07 LAB — LIPID PANEL
Chol/HDL Ratio: 2.9 ratio (ref 0.0–5.0)
Cholesterol, Total: 159 mg/dL (ref 100–199)
HDL: 54 mg/dL (ref >39)
LDL Chol Calc (NIH): 88 mg/dL (ref 0–99)
Triglycerides: 92 mg/dL (ref 0–149)
VLDL Cholesterol Cal: 17 mg/dL (ref 5–40)

## 2021-10-07 LAB — B12 AND FOLATE PANEL
Folate: >20 ng/mL (ref >3.0)
Vitamin B-12: 1927 pg/mL — ABNORMAL HIGH (ref 232–1245)

## 2021-10-07 LAB — TSH+FREE T4
Free T4: 1.67 ng/dL (ref 0.82–1.77)
TSH: 2.77 u[IU]/mL (ref 0.450–4.500)

## 2021-10-07 LAB — HEMOGLOBIN A1C
Est. average glucose Bld gHb Est-mCnc: 143 mg/dL
Hgb A1c MFr Bld: 6.6 % — ABNORMAL HIGH (ref 4.8–5.6)

## 2021-10-08 ENCOUNTER — Other Ambulatory Visit: Payer: Self-pay | Admitting: Nurse Practitioner

## 2021-10-08 DIAGNOSIS — D539 Nutritional anemia, unspecified: Secondary | ICD-10-CM

## 2021-10-09 ENCOUNTER — Telehealth: Payer: Self-pay | Admitting: Physical Medicine & Rehabilitation

## 2021-10-09 NOTE — Telephone Encounter (Signed)
Patient called to inquire status of referral that was sent to Dr Clarice Pole office.  As of today it is in process. Called and notified patient

## 2021-10-12 ENCOUNTER — Ambulatory Visit (HOSPITAL_COMMUNITY): Payer: HMO | Admitting: Physical Therapy

## 2021-10-12 ENCOUNTER — Other Ambulatory Visit: Payer: Self-pay | Admitting: Gastroenterology

## 2021-10-12 ENCOUNTER — Other Ambulatory Visit: Payer: Self-pay | Admitting: Cardiovascular Disease

## 2021-10-14 ENCOUNTER — Ambulatory Visit (HOSPITAL_COMMUNITY): Payer: HMO

## 2021-10-15 ENCOUNTER — Encounter: Payer: Self-pay | Admitting: Internal Medicine

## 2021-10-15 NOTE — Telephone Encounter (Signed)
Sending in 6 month supply. Needs OV for additional refills.

## 2021-10-16 ENCOUNTER — Encounter (INDEPENDENT_AMBULATORY_CARE_PROVIDER_SITE_OTHER): Payer: Self-pay

## 2021-10-19 ENCOUNTER — Other Ambulatory Visit: Payer: Self-pay | Admitting: Family Medicine

## 2021-10-19 ENCOUNTER — Ambulatory Visit (HOSPITAL_COMMUNITY): Payer: HMO | Admitting: Physical Therapy

## 2021-10-19 NOTE — Telephone Encounter (Signed)
Spoke to pt informed him he will need OV for additional refills. Pt voiced understanding.

## 2021-10-20 ENCOUNTER — Telehealth: Payer: Self-pay

## 2021-10-20 NOTE — Telephone Encounter (Signed)
Patient called and stated he has an appointment scheduled where he was referred to on 12/10/21. He wants to know if Dr. Letta Pate can call the provider he is seeing and talk to him about the MRI he received then have that provider give him a call. He stated he it seems like forever waiting for the appointment almost 2 months away. Please advise

## 2021-10-21 NOTE — Telephone Encounter (Signed)
Left voicemail to inform patient that a message was sent to the referred doctor

## 2021-10-22 ENCOUNTER — Ambulatory Visit (HOSPITAL_COMMUNITY): Payer: HMO | Admitting: Physical Therapy

## 2021-10-23 ENCOUNTER — Other Ambulatory Visit: Payer: Self-pay

## 2021-10-23 ENCOUNTER — Ambulatory Visit: Payer: HMO | Admitting: Podiatry

## 2021-10-23 ENCOUNTER — Ambulatory Visit: Payer: HMO | Admitting: Physical Medicine & Rehabilitation

## 2021-10-23 DIAGNOSIS — L97511 Non-pressure chronic ulcer of other part of right foot limited to breakdown of skin: Secondary | ICD-10-CM

## 2021-10-23 DIAGNOSIS — E1159 Type 2 diabetes mellitus with other circulatory complications: Secondary | ICD-10-CM

## 2021-10-23 NOTE — Progress Notes (Signed)
Subjective:  Patient ID: [name], male    DOB: 12/23/1945,  MRN: [id number]  Chief Complaint  Patient presents with   Callouses    Right foot     76 y.o. male presents for wound care.  Patient presents for follow-up to right first metatarsophalangeal joint wound.  This limited to the breakdown of the skin.  He states is doing a lot better he went to the wound care center they discharged him as the wound has completely reepithelialized.   Review of Systems: Negative except as noted in the HPI. Denies N/V/F/Ch.  Past Medical History:  Diagnosis Date   Aftercare following surgery of the circulatory system, NEC 12/04/2013   Angina decubitus ([place]) 05/19/2016   Arrhythmia 05/20/2016   Arthritis    ARTHRITIS, RIGHT FOOT 06/26/2008   Qualifier: Diagnosis of  By: Aline Brochure MD, [name]     Cardiomyopathy- EF NL 01/2013, now 30-35% echo 03/14/2012   Carotid artery occlusion    left s/p CEA   Cellulitis of left foot 05/07/2018   CHF (congestive heart failure) ([facility])    Critical lower limb ischemia ([place]) 02/28/2014   Critical limb ischemia    Diabetes mellitus    Elevated troponin 04/11/2016   GERD (gastroesophageal reflux disease)    History of stroke June 2013 03/14/2012   HOH (hard of hearing)    Hypertension    Hypothyroidism    Nonhealing skin ulcer ([place]) 10/28/2014   Obstructive sleep apnea    Orthostatic hypotension    Osteomyelitis ([facility])    left great toe   Pain in the chest 05/17/2016   Peripheral arterial disease ([place]), s/p PTA x 2 RLE    nonhealing ulcers bilaterally on each great toe   Pneumonia    PONV (postoperative nausea and vomiting)    Pulmonary nodule 05/17/2016   Restless leg syndrome    Shortness of breath    Stroke [facility]) March 08, 2012   Toe osteomyelitis, left ([facility])    Ulcer of great toe, left, with necrosis of bone ([facility])    Wears dentures     Current Outpatient Medications:    furosemide (LASIX) 40 MG tablet, TAKE ONE TABLET (40MG  TOTAL) BY MOUTH TWO TIMES  DAILY., Disp: 180 tablet, Rfl: 0   aspirin EC 81 MG EC tablet, Take 1 tablet (81 mg total) by mouth daily., Disp: , Rfl:    b complex vitamins tablet, Take 1 tablet by mouth daily., Disp: , Rfl:    carvedilol (COREG) 3.125 MG tablet, Take 0.5 tablets (1.5625 mg total) by mouth 2 (two) times daily with a meal., Disp: 180 tablet, Rfl: 3   Cholecalciferol (VITAMIN D3) 5000 units TABS, Take 5,000 Units by mouth 2 (two) times daily. , Disp: , Rfl:    clopidogrel (PLAVIX) 75 MG tablet, TAKE ONE (1) TABLET BY MOUTH EVERY DAY, Disp: 90 tablet, Rfl: 0   Continuous Blood Gluc Receiver (FREESTYLE LIBRE 14 DAY READER) DEVI, , Disp: , Rfl:    Continuous Blood Gluc Sensor (FREESTYLE LIBRE 14 DAY SENSOR) MISC, Inject 1 each into the skin every 14 (fourteen) days., Disp: 2 each, Rfl: 5   Cyanocobalamin 1500 MCG TBDP, Take 1,500 mcg by mouth daily. , Disp: , Rfl:    glimepiride (AMARYL) 4 MG tablet, TAKE ONE TABLET (4MG  TOTAL) BY MOUTH DAILY WITH BREAKFAST, Disp: 90 tablet, Rfl: 1   levothyroxine (SYNTHROID) 88 MCG tablet, Take 1 tablet (88 mcg total) by mouth daily., Disp: 90 tablet, Rfl: 3   Multiple  Vitamin (MULTIVITAMIN WITH MINERALS) TABS tablet, Take 1 tablet by mouth daily., Disp: , Rfl:    nitroGLYCERIN (NITROSTAT) 0.4 MG SL tablet, Place 1 tablet (0.4 mg total) under the tongue every 5 (five) minutes as needed for chest pain., Disp: 25 tablet, Rfl: 3   pantoprazole (PROTONIX) 40 MG tablet, TAKE ONE TABLET BY MOUTH ONCE DAILY, Disp: 90 tablet, Rfl: 1   potassium chloride (KLOR-CON) 10 MEQ tablet, TAKE ONE TABLET (10MEQ TOTAL) BY MOUTH DAILY, Disp: 90 tablet, Rfl: 3   rOPINIRole (REQUIP) 0.5 MG tablet, ropinirole 0.5 mg tablet, Disp: , Rfl:    rosuvastatin (CRESTOR) 20 MG tablet, TAKE ONE TABLET (20MG  TOTAL) BY MOUTH DAILY, Disp: 90 tablet, Rfl: 1   UNABLE TO FIND, Home health supplies for CPAP, Disp: 1 Product, Rfl: 0   UNABLE TO FIND, Butler sock aid to help with support hose. Size XL.  Dx:I73.9, G25.81,  R60.9, Disp: 1 each, Rfl: 0  Social History   Tobacco Use  Smoking Status Former   Types: Pipe   Quit date: 07/08/1977   Years since quitting: 44.3  Smokeless Tobacco Never    Allergies  Allergen Reactions   Codeine Nausea And Vomiting   Tramadol Nausea Only   Objective:  There were no vitals filed for this visit. There is no height or weight on file to calculate BMI. Constitutional Well developed. Well nourished.  Vascular Dorsalis pedis pulses palpable bilaterally. Posterior tibial pulses palpable bilaterally. Capillary refill normal to all digits.  No cyanosis or clubbing noted. Pedal hair growth normal.  Neurologic Normal speech. Oriented to person, place, and time. Protective sensation absent  Dermatologic Right first metatarsal phalangeal wound completely reepithelialized.  No concern for ulceration noted.  No malodor present no erythema noted  Orthopedic: No pain to palpation either foot.   Radiographs: None Assessment:   1. Right foot ulcer, limited to breakdown of skin ([place])   2. Type 2 diabetes mellitus with other circulatory complication, without long-term current use of insulin ([place])     Plan:  Patient was evaluated and treated and all questions answered.  Ulcer right first metatarsophalangeal joint wound limited to the breakdown of the skin -Clinically healed.  The breakdown is completely reepithelialized.  He was seen at the wound care center and was discharged as the wound has completely healed.  He denies any other acute complaints I discussed shoe gear modification extensive detail he states understanding and has been wearing his diabetic shoes.  No follow-ups on file.

## 2021-11-09 ENCOUNTER — Other Ambulatory Visit (HOSPITAL_COMMUNITY): Payer: Self-pay | Admitting: Cardiovascular Disease

## 2021-11-09 DIAGNOSIS — Z9889 Other specified postprocedural states: Secondary | ICD-10-CM

## 2021-11-13 ENCOUNTER — Other Ambulatory Visit: Payer: Self-pay

## 2021-11-13 ENCOUNTER — Encounter: Payer: HMO | Attending: Physical Medicine & Rehabilitation | Admitting: Physical Medicine & Rehabilitation

## 2021-11-13 ENCOUNTER — Encounter: Payer: Self-pay | Admitting: Physical Medicine & Rehabilitation

## 2021-11-13 VITALS — BP 124/71 | HR 74 | Temp 97.9°F | Ht 72.0 in | Wt 204.0 lb

## 2021-11-13 DIAGNOSIS — G8110 Spastic hemiplegia affecting unspecified side: Secondary | ICD-10-CM | POA: Diagnosis present

## 2021-11-13 NOTE — Progress Notes (Signed)
Botox Injection for spasticity using needle EMG guidance  Dilution: 50 Units/ml Indication: Severe spasticity which interferes with ADL,mobility and/or  hygiene and is unresponsive to medication management and other conservative care Informed consent was obtained after describing risks and benefits of the procedure with the patient. This includes bleeding, bruising, infection, excessive weakness, or medication side effects. A REMS form is on file and signed. Needle: 25g 2" needle electrode Number of units per muscle LEFT Adductor longus 50 Adductor magnus 50 All injections were done after obtaining appropriate EMG activity and after negative drawback for blood. The patient tolerated the procedure well. Post procedure instructions were given. A followup appointment was made.

## 2021-11-13 NOTE — Patient Instructions (Signed)

## 2021-11-16 ENCOUNTER — Emergency Department (HOSPITAL_COMMUNITY): Payer: HMO

## 2021-11-16 ENCOUNTER — Other Ambulatory Visit: Payer: Self-pay

## 2021-11-16 ENCOUNTER — Encounter (HOSPITAL_COMMUNITY): Payer: Self-pay

## 2021-11-16 ENCOUNTER — Observation Stay (HOSPITAL_COMMUNITY)
Admission: EM | Admit: 2021-11-16 | Discharge: 2021-11-18 | Disposition: A | Discharge status: Home / Self Care | Payer: HMO | Attending: Internal Medicine | Admitting: Internal Medicine

## 2021-11-16 ENCOUNTER — Telehealth: Payer: Self-pay

## 2021-11-16 DIAGNOSIS — I69354 Hemiplegia and hemiparesis following cerebral infarction affecting left non-dominant side: Secondary | ICD-10-CM | POA: Diagnosis not present

## 2021-11-16 DIAGNOSIS — I10 Essential (primary) hypertension: Secondary | ICD-10-CM | POA: Diagnosis present

## 2021-11-16 DIAGNOSIS — Z7984 Long term (current) use of oral hypoglycemic drugs: Secondary | ICD-10-CM | POA: Insufficient documentation

## 2021-11-16 DIAGNOSIS — Z7902 Long term (current) use of antithrombotics/antiplatelets: Secondary | ICD-10-CM | POA: Insufficient documentation

## 2021-11-16 DIAGNOSIS — Z79899 Other long term (current) drug therapy: Secondary | ICD-10-CM | POA: Diagnosis not present

## 2021-11-16 DIAGNOSIS — I110 Hypertensive heart disease with heart failure: Secondary | ICD-10-CM | POA: Diagnosis not present

## 2021-11-16 DIAGNOSIS — H538 Other visual disturbances: Secondary | ICD-10-CM | POA: Diagnosis present

## 2021-11-16 DIAGNOSIS — Z87891 Personal history of nicotine dependence: Secondary | ICD-10-CM | POA: Diagnosis not present

## 2021-11-16 DIAGNOSIS — R531 Weakness: Secondary | ICD-10-CM | POA: Insufficient documentation

## 2021-11-16 DIAGNOSIS — Z8673 Personal history of transient ischemic attack (TIA), and cerebral infarction without residual deficits: Secondary | ICD-10-CM | POA: Insufficient documentation

## 2021-11-16 DIAGNOSIS — Z20822 Contact with and (suspected) exposure to covid-19: Secondary | ICD-10-CM | POA: Diagnosis not present

## 2021-11-16 DIAGNOSIS — G253 Myoclonus: Secondary | ICD-10-CM | POA: Diagnosis present

## 2021-11-16 DIAGNOSIS — H532 Diplopia: Secondary | ICD-10-CM | POA: Diagnosis present

## 2021-11-16 DIAGNOSIS — R4182 Altered mental status, unspecified: Secondary | ICD-10-CM | POA: Insufficient documentation

## 2021-11-16 DIAGNOSIS — I509 Heart failure, unspecified: Secondary | ICD-10-CM | POA: Insufficient documentation

## 2021-11-16 DIAGNOSIS — K219 Gastro-esophageal reflux disease without esophagitis: Secondary | ICD-10-CM | POA: Diagnosis present

## 2021-11-16 DIAGNOSIS — Z7982 Long term (current) use of aspirin: Secondary | ICD-10-CM | POA: Insufficient documentation

## 2021-11-16 DIAGNOSIS — E039 Hypothyroidism, unspecified: Secondary | ICD-10-CM | POA: Diagnosis not present

## 2021-11-16 DIAGNOSIS — E1159 Type 2 diabetes mellitus with other circulatory complications: Secondary | ICD-10-CM | POA: Insufficient documentation

## 2021-11-16 DIAGNOSIS — R9431 Abnormal electrocardiogram [ECG] [EKG]: Secondary | ICD-10-CM | POA: Diagnosis present

## 2021-11-16 DIAGNOSIS — R053 Chronic cough: Secondary | ICD-10-CM | POA: Diagnosis not present

## 2021-11-16 DIAGNOSIS — E119 Type 2 diabetes mellitus without complications: Secondary | ICD-10-CM | POA: Diagnosis present

## 2021-11-16 DIAGNOSIS — H5712 Ocular pain, left eye: Secondary | ICD-10-CM | POA: Diagnosis present

## 2021-11-16 DIAGNOSIS — G2581 Restless legs syndrome: Secondary | ICD-10-CM | POA: Diagnosis present

## 2021-11-16 LAB — CBC WITH DIFFERENTIAL/PLATELET
Abs Immature Granulocytes: 0.02 10*3/uL (ref 0.00–0.07)
Basophils Absolute: 0 10*3/uL (ref 0.0–0.1)
Basophils Relative: 1 %
Eosinophils Absolute: 0 10*3/uL (ref 0.0–0.5)
Eosinophils Relative: 1 %
HCT: 33 % — ABNORMAL LOW (ref 39.0–52.0)
Hemoglobin: 11.1 g/dL — ABNORMAL LOW (ref 13.0–17.0)
Immature Granulocytes: 0 %
Lymphocytes Relative: 15 %
Lymphs Abs: 0.8 10*3/uL (ref 0.7–4.0)
MCH: 34.6 pg — ABNORMAL HIGH (ref 26.0–34.0)
MCHC: 33.6 g/dL (ref 30.0–36.0)
MCV: 102.8 fL — ABNORMAL HIGH (ref 80.0–100.0)
Monocytes Absolute: 0.3 10*3/uL (ref 0.1–1.0)
Monocytes Relative: 6 %
Neutro Abs: 4 10*3/uL (ref 1.7–7.7)
Neutrophils Relative %: 77 %
Platelets: 166 10*3/uL (ref 150–400)
RBC: 3.21 MIL/uL — ABNORMAL LOW (ref 4.22–5.81)
RDW: 15.1 % (ref 11.5–15.5)
WBC: 5.2 10*3/uL (ref 4.0–10.5)
nRBC: 0 % (ref 0.0–0.2)

## 2021-11-16 LAB — URINALYSIS, ROUTINE W REFLEX MICROSCOPIC
Bilirubin Urine: NEGATIVE
Glucose, UA: NEGATIVE mg/dL
Hgb urine dipstick: NEGATIVE
Ketones, ur: 5 mg/dL — AB
Leukocytes,Ua: NEGATIVE
Nitrite: NEGATIVE
Protein, ur: 30 mg/dL — AB
Specific Gravity, Urine: 1.024 (ref 1.005–1.030)
pH: 5 (ref 5.0–8.0)

## 2021-11-16 LAB — BASIC METABOLIC PANEL
Anion gap: 7 (ref 5–15)
BUN: 19 mg/dL (ref 8–23)
CO2: 24 mmol/L (ref 22–32)
Calcium: 8.5 mg/dL — ABNORMAL LOW (ref 8.9–10.3)
Chloride: 105 mmol/L (ref 98–111)
Creatinine, Ser: 0.87 mg/dL (ref 0.61–1.24)
GFR, Estimated: >60 mL/min (ref >60)
Glucose, Bld: 137 mg/dL — ABNORMAL HIGH (ref 70–99)
Potassium: 3.7 mmol/L (ref 3.5–5.1)
Sodium: 136 mmol/L (ref 135–145)

## 2021-11-16 LAB — GLUCOSE, CAPILLARY: Glucose-Capillary: 182 mg/dL — ABNORMAL HIGH (ref 70–99)

## 2021-11-16 LAB — TROPONIN I (HIGH SENSITIVITY)
Troponin I (High Sensitivity): 7 ng/L (ref <18)
Troponin I (High Sensitivity): 7 ng/L (ref <18)

## 2021-11-16 LAB — RESP PANEL BY RT-PCR (FLU A&B, COVID) ARPGX2
Influenza A by PCR: NEGATIVE
Influenza B by PCR: NEGATIVE
SARS Coronavirus 2 by RT PCR: NEGATIVE

## 2021-11-16 LAB — SEDIMENTATION RATE: Sed Rate: 6 mm/hr (ref 0–16)

## 2021-11-16 LAB — C-REACTIVE PROTEIN: CRP: 0.7 mg/dL (ref <1.0)

## 2021-11-16 MED ORDER — LORAZEPAM 2 MG/ML IJ SOLN
0.5000 mg | Freq: Once | INTRAMUSCULAR | Status: AC
  Administered 2021-11-16: 0.5 mg via INTRAVENOUS
  Filled 2021-11-16: qty 1

## 2021-11-16 MED ORDER — INSULIN ASPART 100 UNIT/ML IJ SOLN
0.0000 [IU] | Freq: Three times a day (TID) | INTRAMUSCULAR | Status: DC
  Administered 2021-11-17 (×2): 2 [IU] via SUBCUTANEOUS
  Administered 2021-11-18 (×2): 5 [IU] via SUBCUTANEOUS

## 2021-11-16 MED ORDER — ROPINIROLE HCL 0.25 MG PO TABS
0.5000 mg | ORAL_TABLET | Freq: Three times a day (TID) | ORAL | Status: DC
  Administered 2021-11-16 – 2021-11-17 (×3): 0.5 mg via ORAL
  Filled 2021-11-16 (×3): qty 2

## 2021-11-16 NOTE — Assessment & Plan Note (Addendum)
Continue Protonix °

## 2021-11-16 NOTE — Assessment & Plan Note (Addendum)
-  Resume home glimepiride -Last hemoglobin A1c was well controlled at 6.6 -Continue to follow modified carbohydrate diet. -Continue outpatient follow-up with PCP to further adjust hypoglycemic regimen as required.

## 2021-11-16 NOTE — Assessment & Plan Note (Addendum)
Continue Synthroid °

## 2021-11-16 NOTE — ED Notes (Signed)
Patient complaining of double vision while attempting visual acuity.

## 2021-11-16 NOTE — H&P (Signed)
History and Physical    Patient: [name] KDX:[id number] DOB: 1946/05/09 DOA: 11/16/2021 DOS: the patient was seen and examined on 11/16/2021 PCP: Renee Rival, FNP  Patient coming from: Home  Chief Complaint:  Chief Complaint  Patient presents with   Altered Mental Status    HPI: [name] is a 76 y.o. male with medical history significant of with history of stroke with left-sided deficits, CHF, diabetes mellitus type 2, GERD, hypertension, hypothyroidism, restless leg, and more presents ED with a chief complaint of blurry vision.  Upon my entry into the room, patient reports that he is seeing multiples of myself standing neck.  He reports this was sudden in onset at 54 PM the day prior to presentation.  Patient reports that he was doing paperwork when it started.  He had associated pain over his left eye.  It felt like an intense pressure like his eye would pop out of the socket, with sharp fleeting pains intermittently.  Pain lasted 10 minutes.  Patient reports he rubbed his eye and it went away.  He took Tylenol and that helped a little.  At the time of exam, right eye is hurting.  Feels like a pressure but not as intense as the left eye had.  Patient denies any headache with these episodes.   Patient reports that he is also had difficulty taking his medications and having to drink a lot of water to get the pills to go down recently.  He reports he woke up out of sleep 2 weeks ago coughing and then dry heaving.  That lasted for 15-20 minutes.  He never did have emesis, and the episode has not recurred.  Patient reports at baseline he walks with a walker, lives independently.  He does report recent Botox injections to help with myoclonic movements of left leg.  The left leg will suddenly smack into the right leg causing him to fall.  Patient has never had blurry vision like this before.  He does not know any family members who have the same concern either.  Patient does not  smoke, does not drink alcohol, does not use illicit drugs.  He is vaccinated for COVID.  Patient is full code.  Review of Systems: As mentioned in the history of present illness. All other systems reviewed and are negative. Past Medical History:  Diagnosis Date   Aftercare following surgery of the circulatory system, NEC 12/04/2013   Angina decubitus ([place]) 05/19/2016   Arrhythmia 05/20/2016   Arthritis    ARTHRITIS, RIGHT FOOT 06/26/2008   Qualifier: Diagnosis of  By: Aline Brochure MD, [name]     Cardiomyopathy- EF NL 01/2013, now 30-35% echo 03/14/2012   Carotid artery occlusion    left s/p CEA   Cellulitis of left foot 05/07/2018   CHF (congestive heart failure) ([place])    Critical lower limb ischemia ([place]) 02/28/2014   Critical limb ischemia    Diabetes mellitus    Elevated troponin 04/11/2016   GERD (gastroesophageal reflux disease)    History of stroke June 2013 03/14/2012   HOH (hard of hearing)    Hypertension    Hypothyroidism    Nonhealing skin ulcer ([place]) 10/28/2014   Obstructive sleep apnea    Orthostatic hypotension    Osteomyelitis (H[facility])    left great toe   Pain in the chest 05/17/2016   Peripheral arterial disease ([place]), s/p PTA x 2 RLE    nonhealing ulcers bilaterally on each great toe   Pneumonia  PONV (postoperative nausea and vomiting)    Pulmonary nodule 05/17/2016   Restless leg syndrome    Shortness of breath    Stroke [facility]) March 08, 2012   Toe osteomyelitis, left [facility])    Ulcer of great toe, left, with necrosis of bone [facility])    Wears dentures    Past Surgical History:  Procedure Laterality Date   AMPUTATION TOE Left 07/25/2018   Procedure: AMPUTATION TOE INTERPHALANGEAL HALLUX LEFT;  Surgeon: Evelina Bucy, DPM;  Location: [place];  Service: Podiatry;  Laterality: Left;   ANGIOPLASTY  02/28/14   diamond back orbital rotational atherectomy of Rt. tibial   BACK SURGERY     BONE BIOPSY Left 07/25/2018   Procedure: SUPERFICIAL BONE BIOPSY;  Surgeon: Evelina Bucy,  DPM;  Location: [place];  Service: Podiatry;  Laterality: Left;   CARDIAC CATHETERIZATION N/A 05/19/2016   Procedure: Right/Left Heart Cath and Coronary Angiography;  Surgeon: Belva Crome, MD;  Location: [place] CV LAB;  Service: Cardiovascular;  Laterality: N/A;   CERVICAL FUSION     ENDARTERECTOMY Left 11/29/2013   Procedure: ENDARTERECTOMY CAROTID;  Surgeon: Serafina Mitchell, MD;  Location: [place];  Service: Vascular;  Laterality: Left;   ESOPHAGOGASTRODUODENOSCOPY  02/2010   Dr. Gala Romney: patient presented with food impaction, schatzki ring with superimposed component of stricture with erosive reflux esophagitis, s/p disimpaction but dilation planned at later date    ESOPHAGOGASTRODUODENOSCOPY (EGD) WITH PROPOFOL N/A 08/13/2019   Dr. Gala Romney: Erosive reflux esophagitis with mild stricture and incidental Mallory-Weiss tear which precluded esophageal dilation.  Medium sized hiatal hernia.   ESOPHAGOGASTRODUODENOSCOPY (EGD) WITH PROPOFOL N/A 10/11/2019   Dr. Gala Romney: Esophageal stenosis status post dilation, moderate hiatal hernia   EYE SURGERY     FLEXIBLE SIGMOIDOSCOPY N/A 08/13/2019   Procedure: FLEXIBLE SIGMOIDOSCOPY;  Surgeon: [name], MD;  [facility];  Service: Endoscopy;  Laterality: N/A;  colonoscopy aboerted due to formed stool and poor prep   FOOT SURGERY     KNEE ARTHROSCOPY WITH MEDIAL MENISECTOMY Left 05/06/2020   Procedure: KNEE ARTHROSCOPY WITH MEDIAL MENISCECTOMY AND LATERAL MENISCECTOMY;  Surgeon: [name], MD;  Location: [facility];  Service: Orthopedics;  Laterality: Left;   Lower ext duplex doppler  03/14/14   Rt ABI 1.2   LOWER EXTREMITY ANGIOGRAM Bilateral 02/18/2014   Procedure: LOWER EXTREMITY ANGIOGRAM;  Surgeon: [name], MD;  Location: [facility] CATH LAB;  Service: Cardiovascular;  Laterality: Bilateral;   LOWER EXTREMITY ANGIOGRAM N/A 10/31/2014   Procedure: LOWER EXTREMITY ANGIOGRAM;  Surgeon: [name], MD;  Location: [facility] CATH LAB;  Service:  Cardiovascular;  Laterality: N/A;   MALONEY DILATION N/A 10/11/2019   Procedure: Venia Minks DILATION;  Surgeon: [name], MD;  [facility];  Service: Endoscopy;  Laterality: N/A;   MULTIPLE TOOTH EXTRACTIONS     PV angiogram  02/18/2014   tibial vessel diseas bil.   SPINE SURGERY     tendon achillies lengthing and sesamoid     Social History:  reports that he quit smoking about 44 years ago. His smoking use included pipe. He has never used smokeless tobacco. He reports that he does not drink alcohol and does not use drugs.  Allergies  Allergen Reactions   Codeine Nausea And Vomiting   Tramadol Nausea Only    Family History  Problem Relation Age of Onset   Heart disease Mother    Hypertension Mother    Heart attack Mother  Hypertension Father    Diabetes Father    Diabetes Son    Heart disease Son    Hypertension Son    Colon cancer Neg Hx     Prior to Admission medications   Medication Sig Start Date End Date Taking? Authorizing Provider  aspirin EC 81 MG EC tablet Take 1 tablet (81 mg total) by mouth daily. 05/21/16  Yes Barrett, Evelene Croon, PA-C  b complex vitamins tablet Take 1 tablet by mouth daily.   Yes [provider]  carvedilol (COREG) 3.125 MG tablet Take 0.5 tablets (1.5625 mg total) by mouth 2 (two) times daily with a meal. 03/18/21  Yes Noreene Larsson, NP  Cholecalciferol (VITAMIN D3) 5000 units TABS Take 5,000 Units by mouth daily at 6 (six) AM.   Yes [provider]  clopidogrel (PLAVIX) 75 MG tablet TAKE ONE (1) [place] DAY Patient taking differently: Take 75 mg by mouth daily. 03/16/21  Yes Fayrene Helper, MD  Cyanocobalamin 1500 MCG TBDP Take 1,500 mcg by mouth daily.    Yes [provider]  furosemide (LASIX) 40 MG tablet TAKE ONE TABLET ($RemoveBef'40MG'ihOgUTyksq$  TOTAL) BY MOUTH TWO TIMES DAILY. Patient taking differently: Take 40 mg by mouth 2 (two) times daily. 10/13/21  Yes [name], MD  glimepiride (AMARYL) 4  MG tablet TAKE ONE TABLET ($RemoveBef'4MG'HgHnDxgMAr$  TOTAL) BY MOUTH DAILY WITH BREAKFAST 10/19/21  Yes Paseda, Dewaine Conger, FNP  levothyroxine (SYNTHROID) 88 MCG tablet Take 1 tablet (88 mcg total) by mouth daily. 01/27/21  Yes Noreene Larsson, NP  magnesium 30 MG tablet Take 30 mg by mouth 2 (two) times daily.   Yes [provider]  Multiple Vitamin (MULTIVITAMIN WITH MINERALS) TABS tablet Take 1 tablet by mouth daily.   Yes [provider]  pantoprazole (PROTONIX) 40 MG tablet TAKE ONE TABLET BY MOUTH ONCE DAILY Patient taking differently: Take 40 mg by mouth daily. 10/15/21  Yes Jodi Mourning, Kristen S, PA-C  potassium chloride (KLOR-CON) 10 MEQ tablet TAKE ONE TABLET (10MEQ TOTAL) BY MOUTH DAILY Patient taking differently: Take 10 mEq by mouth daily. 05/11/21  Yes [name], MD  rOPINIRole (REQUIP) 0.5 MG tablet Take 0.5 mg by mouth 3 (three) times daily.   Yes [provider]  rosuvastatin (CRESTOR) 20 MG tablet TAKE ONE TABLET ($RemoveBef'20MG'mhCiQTrrbV$  TOTAL) BY MOUTH DAILY Patient taking differently: Take 20 mg by mouth daily. 10/19/21  Yes Paseda, Dewaine Conger, FNP  Continuous Blood Gluc Receiver (FREESTYLE LIBRE 14 DAY READER) DEVI  12/07/19   [provider]  Continuous Blood Gluc Sensor (FREESTYLE LIBRE 14 DAY SENSOR) MISC Inject 1 each into the skin every 14 (fourteen) days. 01/22/21   Noreene Larsson, NP  nitroGLYCERIN (NITROSTAT) 0.4 MG SL tablet Place 1 tablet (0.4 mg total) under the tongue every 5 (five) minutes as needed for chest pain. 05/22/16   Barrett, Evelene Croon, PA-C  UNABLE TO FIND Home health supplies for CPAP 09/30/20   Perlie Mayo, NP  UNABLE TO FIND [place] sock aid to help with support hose. Size XL.  Dx:I73.9, G25.81, R60.9 01/14/21   Noreene Larsson, NP    Physical Exam: Vitals:   11/16/21 1241 11/16/21 1520 11/16/21 1630 11/16/21 1859  BP:  (!) 144/77 137/76 (!) 181/88  Pulse:  73 70 70  Resp:  17  18  Temp:      TempSrc:      SpO2:  100% 95% 99%  Weight: 93 kg  Height:  6' (1.829 m)      1.  General: Patient lying supine in bed,  no acute distress   2. Psychiatric: Alert and oriented x 3, mood and behavior normal for situation, pleasant and cooperative with exam   3. Neurologic: Speech and language are normal, face is symmetric, moves all 4 extremities voluntarily, 4 out of 5 strength in the left upper extremity compared to the right 5 out of 5 strength.  4/5 strength in the left lower extremity as well.  Double vision when both eyes are open.  Vision normalizes when he covers either eye.  Visual fields intact.  No nystagmus.  At baseline without acute deficits on limited exam   4. HEENMT:  Head is atraumatic, normocephalic, pupils reactive to light, neck is supple, trachea is midline, mucous membranes are moist   5. Respiratory : Lungs are clear to auscultation bilaterally without wheezing, rhonchi, rales, no cyanosis, no increase in work of breathing or accessory muscle use   6. Cardiovascular : Heart rate normal, rhythm is regular, no murmurs, rubs or gallops, no peripheral edema, peripheral pulses palpated   7. Gastrointestinal:  Abdomen is soft, nondistended, nontender to palpation bowel sounds active, no masses or organomegaly palpated   8. Skin:  Skin is warm, dry and intact without rashes, acute lesions, or ulcers on limited exam   9.Musculoskeletal:  No acute deformities or trauma, no asymmetry in tone, no peripheral edema, peripheral pulses palpated, no tenderness to palpation in the extremities   Data Reviewed: In the ED Temp 97.3, heart rate 74, respiratory rate 18, blood pressure 130/63, satting at 95% No leukocytosis with white blood cell count of 5.2, hemoglobin 11.1 Trop 7 Respiratory panel negative UA shows rare bacteria and 11-20 white blood cells-no urinary symptoms CT head shows remote/chronic changes Chest x-ray shows no active cardiopulmonary disease ED physician was initially concern for temporal arteritis-ESR is  normal EKG shows a heart rate of 69, sinus rhythm, QTc 540 MRI brain shows no acute intracranial abnormality with chronic lacunar infarcts  Assessment and Plan: * Blurred vision- (present on admission) Patient was concern for temporal arteritis given blurry vision, and tenderness with palpation of the temples ESR is normal No steroids started at this time CT head and MRI brain showed no acute disease Consult neurology Continue to monitor  GERD (gastroesophageal reflux disease)- (present on admission) Continue Protonix  Prolonged QT interval- (present on admission) EKG shows QTc 546 Monitor electrolytes and replace as indicated Monitor on Tele Repeat EKG in the AM  Hypothyroidism- (present on admission) Continue Synthroid  Myoclonus- (present on admission) Patient has history of stroke stated left him with residual left-sided deficits.  He reports left-sided contracture, paresthesias of his upper extremity, and myoclonic movements of his left lower extremity that interfere with his ambulation.  For this he recently had Botox injections 3 days ago. Continue to monitor  Restless legs- (present on admission) Continue Requip  Type 2 diabetes mellitus with circulatory disorder ([place])- (present on admission) Hold glimepiride Sliding scale coverage Last hemoglobin A1c was well controlled at 6.6 Continue to monitor  Hypertension- (present on admission) Holding Coreg at this time Blood pressure well controlled at 130/63       Advance Care Planning:   Code Status: Prior full  Consults: Neurology  Family Communication: No family at bedside  Severity of Illness: The appropriate patient status for this patient is OBSERVATION. Observation status is judged to be reasonable and necessary in order to provide the  required intensity of service to ensure the patient's safety. The patient's presenting symptoms, physical exam findings, and initial radiographic and laboratory data in the  context of their medical condition is felt to place them at decreased risk for further clinical deterioration. Furthermore, it is anticipated that the patient will be medically stable for discharge from the hospital within 2 midnights of admission.   Author: Rolla Plate, DO 11/16/2021 8:17 PM  For on call review [email or web address].

## 2021-11-16 NOTE — Assessment & Plan Note (Addendum)
-  EKG shows QTc 546 -Minimize the use of medications that can prolong QT -Stable telemetry monitoring appreciated during hospitalization. -Follow basic metabolic panels and magnesium level to assure stability of electrolytes.  (Goal is for potassium more than 4 and magnesium more than 2).

## 2021-11-16 NOTE — ED Provider Notes (Signed)
[facility] EMERGENCY DEPARTMENT Provider Note   CSN: [id number] Arrival date & time: 11/16/21  1234     History  Chief Complaint  Patient presents with   Altered Mental Status    [name] is a 76 y.o. male presenting to ED with multiple complaints.  The patient reports he has blurred or double vision, worse in the left eye, beginning yesterday evening around 9 PM.  He also has generalized weakness all over, no energy, a frontal headache.  He reports he had a Botox injection on Thursday in his leg.  He reports has a history of a stroke in the past with some expressive aphasia, which is improved, and he takes aspirin and Plavix regularly.  No sick contacts in the house.  Chronic cough for the past week.  1 episode of vomiting earlier this week.  HPI     Home Medications Prior to Admission medications   Medication Sig Start Date End Date Taking? Authorizing Provider  aspirin EC 81 MG EC tablet Take 1 tablet (81 mg total) by mouth daily. 05/21/16  Yes Barrett, Evelene Croon, PA-C  b complex vitamins tablet Take 1 tablet by mouth daily.   Yes [provider]  carvedilol (COREG) 3.125 MG tablet Take 0.5 tablets (1.5625 mg total) by mouth 2 (two) times daily with a meal. 03/18/21  Yes Noreene Larsson, NP  Cholecalciferol (VITAMIN D3) 5000 units TABS Take 5,000 Units by mouth daily at 6 (six) AM.   Yes [provider]  clopidogrel (PLAVIX) 75 MG tablet TAKE ONE (1) [place] DAY Patient taking differently: Take 75 mg by mouth daily. 03/16/21  Yes Fayrene Helper, MD  Cyanocobalamin 1500 MCG TBDP Take 1,500 mcg by mouth daily.    Yes [provider]  furosemide (LASIX) 40 MG tablet TAKE ONE TABLET (40MG TOTAL) BY MOUTH TWO TIMES DAILY. Patient taking differently: Take 40 mg by mouth 2 (two) times daily. 10/13/21  Yes [name], MD  glimepiride (AMARYL) 4 MG tablet TAKE ONE TABLET (4MG TOTAL) BY MOUTH DAILY WITH BREAKFAST 10/19/21  Yes Paseda,  Dewaine Conger, FNP  levothyroxine (SYNTHROID) 88 MCG tablet Take 1 tablet (88 mcg total) by mouth daily. 01/27/21  Yes Noreene Larsson, NP  magnesium 30 MG tablet Take 30 mg by mouth 2 (two) times daily.   Yes [provider]  Multiple Vitamin (MULTIVITAMIN WITH MINERALS) TABS tablet Take 1 tablet by mouth daily.   Yes [provider]  pantoprazole (PROTONIX) 40 MG tablet TAKE ONE TABLET BY MOUTH ONCE DAILY Patient taking differently: Take 40 mg by mouth daily. 10/15/21  Yes Jodi Mourning, Kristen S, PA-C  potassium chloride (KLOR-CON) 10 MEQ tablet TAKE ONE TABLET (10MEQ TOTAL) BY MOUTH DAILY Patient taking differently: Take 10 mEq by mouth daily. 05/11/21  Yes [name], MD  rOPINIRole (REQUIP) 0.5 MG tablet Take 0.5 mg by mouth 3 (three) times daily.   Yes [provider]  rosuvastatin (CRESTOR) 20 MG tablet TAKE ONE TABLET (20MG TOTAL) BY MOUTH DAILY Patient taking differently: Take 20 mg by mouth daily. 10/19/21  Yes Paseda, Dewaine Conger, FNP  Continuous Blood Gluc Receiver (FREESTYLE LIBRE 14 DAY READER) DEVI  12/07/19   [provider]  Continuous Blood Gluc Sensor (FREESTYLE LIBRE 14 DAY SENSOR) MISC Inject 1 each into the skin every 14 (fourteen) days. 01/22/21   Noreene Larsson, NP  nitroGLYCERIN (NITROSTAT) 0.4 MG SL tablet Place 1 tablet (0.4 mg total) under  the tongue every 5 (five) minutes as needed for chest pain. 05/22/16   Barrett, Evelene Croon, PA-C  UNABLE TO FIND Home health supplies for CPAP 09/30/20   Perlie Mayo, NP  UNABLE TO FIND [place] sock aid to help with support hose. Size XL.  Dx:I73.9, G25.81, R60.9 01/14/21   Noreene Larsson, NP      Allergies    Codeine and Tramadol    Review of Systems   Review of Systems  Physical Exam Updated Vital Signs BP (!) 144/77    Pulse 73    Temp (!) 97.3 F (36.3 C) (Oral)    Resp 17    Ht 6' (1.829 m)    Wt 93 kg    SpO2 100%    BMI 27.80 kg/m  Physical Exam Constitutional:      General: He is not in  acute distress.    Comments: Appears fatigued, tired  HENT:     Head: Normocephalic and atraumatic.     Comments: Bilateral temporal tenderness (mild, L > R) Eyes:     General:        Right eye: No discharge.        Left eye: No discharge.     Extraocular Movements: Extraocular movements intact.     Conjunctiva/sclera: Conjunctivae normal.     Pupils: Pupils are equal, round, and reactive to light.  Cardiovascular:     Rate and Rhythm: Normal rate and regular rhythm.     Pulses: Normal pulses.  Pulmonary:     Effort: Pulmonary effort is normal. No respiratory distress.  Abdominal:     General: There is no distension.     Tenderness: There is no abdominal tenderness.  Skin:    General: Skin is warm and dry.  Neurological:     General: No focal deficit present.     Mental Status: He is alert. Mental status is at baseline.  Psychiatric:        Mood and Affect: Mood normal.        Behavior: Behavior normal.    ED Results / Procedures / Treatments   Labs (all labs ordered are listed, but only abnormal results are displayed) Labs Reviewed  BASIC METABOLIC PANEL - Abnormal; Notable for the following components:      Result Value   Glucose, Bld 137 (*)    Calcium 8.5 (*)    All other components within normal limits  CBC WITH DIFFERENTIAL/PLATELET - Abnormal; Notable for the following components:   RBC 3.21 (*)    Hemoglobin 11.1 (*)    HCT 33.0 (*)    MCV 102.8 (*)    MCH 34.6 (*)    All other components within normal limits  URINALYSIS, ROUTINE W REFLEX MICROSCOPIC - Abnormal; Notable for the following components:   Color, Urine AMBER (*)    APPearance HAZY (*)    Ketones, ur 5 (*)    Protein, ur 30 (*)    Bacteria, UA RARE (*)    All other components within normal limits  RESP PANEL BY RT-PCR (FLU A&B, COVID) ARPGX2  SEDIMENTATION RATE  C-REACTIVE PROTEIN  TROPONIN I (HIGH SENSITIVITY)  TROPONIN I (HIGH SENSITIVITY)    EKG EKG  Interpretation  Date/Time:  Monday November 16 2021 13:35:55 EST Ventricular Rate:  69 PR Interval:  233 QRS Duration: 99 QT Interval:  504 QTC Calculation: 540 R Axis:   45 Text Interpretation: Sinus rhythm Prolonged PR interval Borderline T wave abnormalities Prolonged QT interval  Confirmed by Octaviano Glow [phone]) on 11/16/2021 1:50:18 PM  Radiology DG Chest 2 View  Result Date: 11/16/2021 CLINICAL DATA:  Infection evaluation. EXAM: CHEST - 2 VIEW COMPARISON:  May 16, 2021 FINDINGS: The cardiomediastinal silhouette is stable. No pneumothorax. No nodules or masses. No focal infiltrates. Mild atelectasis in the left base. No acute abnormalities. IMPRESSION: No active cardiopulmonary disease. Electronically Signed   By: Dorise Bullion III M.D.   On: 11/16/2021 14:12   CT Head Wo Contrast  Result Date: 11/16/2021 CLINICAL DATA:  Mental status change, headaches and double vision. EXAM: CT HEAD WITHOUT CONTRAST TECHNIQUE: Contiguous axial images were obtained from the base of the skull through the vertex without intravenous contrast. RADIATION DOSE REDUCTION: This exam was performed according to the departmental dose-optimization program which includes automated exposure control, adjustment of the mA and/or kV according to patient size and/or use of iterative reconstruction technique. COMPARISON:  05/06/2021 FINDINGS: Brain: Stable age related cerebral atrophy, ventriculomegaly and periventricular white matter disease. Remote basal ganglia and deep white matter infarcts on the right. No extra-axial fluid collections are identified. No CT findings for acute hemispheric infarction or intracranial hemorrhage. No mass lesions. The brainstem and cerebellum are normal. Vascular: Stable vascular calcifications. No aneurysm or hyperdense vessels. Skull: No skull fracture or bone lesions. Sinuses/Orbits: The paranasal sinuses and mastoid air cells are clear. The globes are intact. Other: No scalp lesions  or scalp hematoma. IMPRESSION: 1. Remote/chronic brain changes. 2. No acute intracranial findings or mass lesion. Electronically Signed   By: Marijo Sanes M.D.   On: 11/16/2021 14:06    Procedures Procedures    Medications Ordered in ED Medications  rOPINIRole (REQUIP) tablet 0.5 mg (0.5 mg Oral Given 11/16/21 1524)  LORazepam (ATIVAN) injection 0.5 mg (0.5 mg Intravenous Given 11/16/21 1518)    ED Course/ Medical Decision Making/ A&P Clinical Course as of 11/16/21 1738  Mon Nov 16, 2021  1504 Botox injection right leg 3 days ago, Dr Marva Panda per phone note today did not feel his symptoms are related to botox infection. [MT]    Clinical Course User Index [MT] Lenore Moyano, Carola Rhine, MD                           Medical Decision Making Amount and/or Complexity of Data Reviewed Labs: ordered. Radiology: ordered. ECG/medicine tests: ordered.  Risk Prescription drug management. Decision regarding hospitalization.   Patient is due to blurred vision, primarily in the left eye, vision 20/25 left eye and 20/30 in right on on acuity testing by RN.  He does not appear to have cranial nerve palsy or extraocular nerve palsy on exam.  No peripheral field deficits.  CT imaging of the brain did not show acute CVA.  Patient likely need MRI of the brain as well.  He describes proximal muscle soreness and weakness in his shoulders, arms and legs.  He reports he has restless leg as well and pain in his proximal legs all the time and is requesting his ropinirole which I have ordered.  He does not have any known autoimmune history, but overall this does raise some concern also for potential temporal arteritis.  He complains of bilateral temporal pain and does have some mild tenderness along the left temporal artery on exam.  No significant visual loss at this time.  We will check an ESR and CRP.  I will admit the patient to the hospital at this time.  If MRI is  unremarkable, I would recommend consideration  of IV steroids and possible temporal biopsy for arteritis evaluation.  I personally reviewed and interpreted patient's imaging and labs.  No leukocytosis, no acute anemia, no significant dehydration.  Troponin is 7.  No evidence of UTI.  X-ray of the chest does not show focal infiltrate.  CT of the head does not show evidence stroke.  COVID and flu are negative.  Lower suspicion for sepsis.  Low suspicion for meningitis with this constellation of symptoms.  Afebrile with no leukocytosis or nuchal rigidity        Final Clinical Impression(s) / ED Diagnoses Final diagnoses:  Weakness  Blurred vision    Rx / DC Orders ED Discharge Orders     None         Tonisha Silvey, Carola Rhine, MD 11/16/21 1739

## 2021-11-16 NOTE — Assessment & Plan Note (Addendum)
-  There was initial concerns for temporal arteritis given blurry vision, and tenderness with palpation of the temples. -ESR normal, CRP normal; images and angiogram demonstrating no vasculitis process -There is concern for brainstem and small infarct not visualized on patient's images. -Case has been discussed with neurology service; at this moment there is no plan or need for high-dose steroids and will treat with full dose aspirin and Plavix for 3 months and outpatient follow-up with neurology. -Patient can continue as needed pain medications for his ongoing mild headache and continue the use of an eye patch to assist with diplopia symptoms.

## 2021-11-16 NOTE — Telephone Encounter (Signed)
Dr. Letta Pate is out of the office today:   Mr. Wolfson left a voice message at 11 am today. Stating he has had double vision since Sunday. He thinks it maybe related to his neck issue. He would like to speak to Dr. Letta Pate.   I return his in a attempt to get more details. However he is currently being  see at [facility] for the double vision.

## 2021-11-16 NOTE — Assessment & Plan Note (Addendum)
-  Continue Requip and follow-up with neurology as an outpatient.

## 2021-11-16 NOTE — Assessment & Plan Note (Addendum)
-   Stable vital signs -Resume home antihypertensive agents -Continue heart healthy diet.

## 2021-11-16 NOTE — Assessment & Plan Note (Addendum)
-  Patient has history of stroke stated left him with residual left-sided deficits.   -He reports left-sided contracture, paresthesias of his upper extremity, and myoclonic movements of his left lower extremity that interfere with his ambulation. For this he recently had Botox injections and is actively follow as an outpatient for it. -Continue outpatient treatment.

## 2021-11-16 NOTE — ED Triage Notes (Addendum)
Pt presents to ED with complaints of double vision out of both eyes, having trouble focusing states "everything around me is surreal" States started having these symptoms around 2100 last night. Pt also reports being weak all over. Pt's wife reports he had a Botox injection on Thursday and was told to report to ED.

## 2021-11-16 NOTE — Telephone Encounter (Signed)
Patient called and stated he got Botox on Friday 11/13/21 in right leg and now has tingling in right arm. He also states he started having blurred vision at 9 pm last night . He is scared and wants to know what to do. Please advise

## 2021-11-16 NOTE — Telephone Encounter (Signed)
Spoke with Dr. Letta Pate and he suggested he go to the ER. He did not think it was side effects of the Botox. Duplicated message from [place], [place]. See previous encounter

## 2021-11-16 NOTE — Plan of Care (Signed)

## 2021-11-17 ENCOUNTER — Observation Stay (HOSPITAL_COMMUNITY): Payer: HMO

## 2021-11-17 ENCOUNTER — Encounter (HOSPITAL_COMMUNITY): Payer: Self-pay | Admitting: Radiology

## 2021-11-17 DIAGNOSIS — H538 Other visual disturbances: Secondary | ICD-10-CM | POA: Diagnosis not present

## 2021-11-17 DIAGNOSIS — E1159 Type 2 diabetes mellitus with other circulatory complications: Secondary | ICD-10-CM | POA: Diagnosis not present

## 2021-11-17 DIAGNOSIS — E039 Hypothyroidism, unspecified: Secondary | ICD-10-CM | POA: Diagnosis not present

## 2021-11-17 DIAGNOSIS — K219 Gastro-esophageal reflux disease without esophagitis: Secondary | ICD-10-CM | POA: Diagnosis not present

## 2021-11-17 DIAGNOSIS — I10 Essential (primary) hypertension: Secondary | ICD-10-CM | POA: Diagnosis not present

## 2021-11-17 LAB — CBC WITH DIFFERENTIAL/PLATELET
Abs Immature Granulocytes: 0.03 10*3/uL (ref 0.00–0.07)
Basophils Absolute: 0 10*3/uL (ref 0.0–0.1)
Basophils Relative: 1 %
Eosinophils Absolute: 0.1 10*3/uL (ref 0.0–0.5)
Eosinophils Relative: 1 %
HCT: 31.8 % — ABNORMAL LOW (ref 39.0–52.0)
Hemoglobin: 10.4 g/dL — ABNORMAL LOW (ref 13.0–17.0)
Immature Granulocytes: 1 %
Lymphocytes Relative: 21 %
Lymphs Abs: 1.1 10*3/uL (ref 0.7–4.0)
MCH: 33.4 pg (ref 26.0–34.0)
MCHC: 32.7 g/dL (ref 30.0–36.0)
MCV: 102.3 fL — ABNORMAL HIGH (ref 80.0–100.0)
Monocytes Absolute: 0.4 10*3/uL (ref 0.1–1.0)
Monocytes Relative: 7 %
Neutro Abs: 3.6 10*3/uL (ref 1.7–7.7)
Neutrophils Relative %: 69 %
Platelets: 150 10*3/uL (ref 150–400)
RBC: 3.11 MIL/uL — ABNORMAL LOW (ref 4.22–5.81)
RDW: 15.1 % (ref 11.5–15.5)
WBC: 5.2 10*3/uL (ref 4.0–10.5)
nRBC: 0 % (ref 0.0–0.2)

## 2021-11-17 LAB — GLUCOSE, CAPILLARY
Glucose-Capillary: 91 mg/dL (ref 70–99)
Glucose-Capillary: 160 mg/dL — ABNORMAL HIGH (ref 70–99)
Glucose-Capillary: 200 mg/dL — ABNORMAL HIGH (ref 70–99)
Glucose-Capillary: 299 mg/dL — ABNORMAL HIGH (ref 70–99)

## 2021-11-17 LAB — COMPREHENSIVE METABOLIC PANEL
ALT: 12 U/L (ref 0–44)
AST: 15 U/L (ref 15–41)
Albumin: 3.6 g/dL (ref 3.5–5.0)
Alkaline Phosphatase: 49 U/L (ref 38–126)
Anion gap: 8 (ref 5–15)
BUN: 15 mg/dL (ref 8–23)
CO2: 24 mmol/L (ref 22–32)
Calcium: 8.5 mg/dL — ABNORMAL LOW (ref 8.9–10.3)
Chloride: 105 mmol/L (ref 98–111)
Creatinine, Ser: 0.72 mg/dL (ref 0.61–1.24)
GFR, Estimated: >60 mL/min (ref >60)
Glucose, Bld: 88 mg/dL (ref 70–99)
Potassium: 3.3 mmol/L — ABNORMAL LOW (ref 3.5–5.1)
Sodium: 137 mmol/L (ref 135–145)
Total Bilirubin: 0.4 mg/dL (ref 0.3–1.2)
Total Protein: 6.4 g/dL — ABNORMAL LOW (ref 6.5–8.1)

## 2021-11-17 LAB — MAGNESIUM: Magnesium: 2.2 mg/dL (ref 1.7–2.4)

## 2021-11-17 LAB — TSH: TSH: 4.52 u[IU]/mL — ABNORMAL HIGH (ref 0.350–4.500)

## 2021-11-17 MED ORDER — ONDANSETRON HCL 4 MG/2ML IJ SOLN: 4.0000 mg | Freq: Four times a day (QID) | INTRAMUSCULAR | Status: DC | PRN

## 2021-11-17 MED ORDER — POTASSIUM CHLORIDE CRYS ER 20 MEQ PO TBCR
40.0000 meq | EXTENDED_RELEASE_TABLET | Freq: Once | ORAL | Status: AC
  Administered 2021-11-17: 40 meq via ORAL
  Filled 2021-11-17: qty 2

## 2021-11-17 MED ORDER — GADOBUTROL 1 MMOL/ML IV SOLN
10.0000 mL | Freq: Once | INTRAVENOUS | Status: AC | PRN
  Administered 2021-11-17: 10 mL via INTRAVENOUS

## 2021-11-17 MED ORDER — ONDANSETRON HCL 4 MG PO TABS: 4.0000 mg | ORAL_TABLET | Freq: Four times a day (QID) | ORAL | Status: DC | PRN

## 2021-11-17 MED ORDER — HEPARIN SODIUM (PORCINE) 5000 UNIT/ML IJ SOLN
5000.0000 [IU] | Freq: Three times a day (TID) | INTRAMUSCULAR | Status: DC
  Administered 2021-11-17: 5000 [IU] via SUBCUTANEOUS
  Filled 2021-11-17: qty 1

## 2021-11-17 MED ORDER — ACETAMINOPHEN 650 MG RE SUPP: 650.0000 mg | Freq: Four times a day (QID) | RECTAL | Status: DC | PRN

## 2021-11-17 MED ORDER — FUROSEMIDE 40 MG PO TABS
40.0000 mg | ORAL_TABLET | Freq: Two times a day (BID) | ORAL | Status: DC
  Administered 2021-11-17 – 2021-11-18 (×3): 40 mg via ORAL
  Filled 2021-11-17 (×3): qty 1

## 2021-11-17 MED ORDER — IOHEXOL 350 MG/ML SOLN
100.0000 mL | Freq: Once | INTRAVENOUS | Status: AC | PRN
Start: 2021-11-17 — End: 2021-11-17
  Administered 2021-11-17: 75 mL via INTRAVENOUS

## 2021-11-17 MED ORDER — CLOPIDOGREL BISULFATE 75 MG PO TABS
75.0000 mg | ORAL_TABLET | Freq: Every day | ORAL | Status: DC
  Administered 2021-11-18: 75 mg via ORAL
  Filled 2021-11-17: qty 1

## 2021-11-17 MED ORDER — ASPIRIN EC 81 MG PO TBEC
81.0000 mg | DELAYED_RELEASE_TABLET | Freq: Every day | ORAL | Status: DC
  Administered 2021-11-17 – 2021-11-18 (×2): 81 mg via ORAL
  Filled 2021-11-17 (×2): qty 1

## 2021-11-17 MED ORDER — PANTOPRAZOLE SODIUM 40 MG PO TBEC
40.0000 mg | DELAYED_RELEASE_TABLET | Freq: Every day | ORAL | Status: DC
  Administered 2021-11-17 – 2021-11-18 (×2): 40 mg via ORAL
  Filled 2021-11-17 (×2): qty 1

## 2021-11-17 MED ORDER — SODIUM CHLORIDE 0.9 % IV SOLN
1000.0000 mg | INTRAVENOUS | Status: DC
  Administered 2021-11-17: 1000 mg via INTRAVENOUS
  Filled 2021-11-17 (×2): qty 16

## 2021-11-17 MED ORDER — ROPINIROLE HCL 1 MG PO TABS
1.0000 mg | ORAL_TABLET | Freq: Three times a day (TID) | ORAL | Status: DC
  Administered 2021-11-17 – 2021-11-18 (×3): 1 mg via ORAL
  Filled 2021-11-17 (×4): qty 1

## 2021-11-17 MED ORDER — OXYCODONE HCL 5 MG PO TABS: 5.0000 mg | ORAL_TABLET | ORAL | Status: DC | PRN

## 2021-11-17 MED ORDER — MORPHINE SULFATE (PF) 2 MG/ML IV SOLN: 2.0000 mg | INTRAVENOUS | Status: DC | PRN

## 2021-11-17 MED ORDER — ROSUVASTATIN CALCIUM 20 MG PO TABS
20.0000 mg | ORAL_TABLET | Freq: Every day | ORAL | Status: DC
Start: 2021-11-17 — End: 2021-11-18
  Administered 2021-11-17 – 2021-11-18 (×2): 20 mg via ORAL
  Filled 2021-11-17 (×2): qty 1

## 2021-11-17 MED ORDER — ACETAMINOPHEN 325 MG PO TABS: 650.0000 mg | ORAL_TABLET | Freq: Four times a day (QID) | ORAL | Status: DC | PRN

## 2021-11-17 MED ORDER — LEVOTHYROXINE SODIUM 88 MCG PO TABS
88.0000 ug | ORAL_TABLET | Freq: Every day | ORAL | Status: DC
  Administered 2021-11-17 – 2021-11-18 (×2): 88 ug via ORAL
  Filled 2021-11-17 (×2): qty 1

## 2021-11-17 MED ORDER — CLOPIDOGREL BISULFATE 75 MG PO TABS
75.0000 mg | ORAL_TABLET | Freq: Every day | ORAL | Status: DC
Start: 2021-11-17 — End: 2021-11-17
  Administered 2021-11-17: 75 mg via ORAL
  Filled 2021-11-17: qty 1

## 2021-11-17 NOTE — Hospital Course (Addendum)
Per HPI: [name] is a 76 y.o. male with medical history significant of with history of stroke with left-sided deficits, CHF, diabetes mellitus type 2, GERD, hypertension, hypothyroidism, restless leg, and more presents ED with a chief complaint of blurry vision.  Upon my entry into the room, patient reports that he is seeing multiples of myself standing neck.  He reports this was sudden in onset at 80 PM the day prior to presentation.  Patient reports that he was doing paperwork when it started.  He had associated pain over his left eye.  It felt like an intense pressure like his eye would pop out of the socket, with sharp fleeting pains intermittently.  Pain lasted 10 minutes.  Patient reports he rubbed his eye and it went away.  He took Tylenol and that helped a little.  At the time of exam, right eye is hurting.  Feels like a pressure but not as intense as the left eye had.  Patient denies any headache with these episodes.   Patient reports that he is also had difficulty taking his medications and having to drink a lot of water to get the pills to go down recently.  He reports he woke up out of sleep 2 weeks ago coughing and then dry heaving.  That lasted for 15-20 minutes.  He never did have emesis, and the episode has not recurred.  Patient reports at baseline he walks with a walker, lives independently.  He does report recent Botox injections to help with myoclonic movements of left leg.  The left leg will suddenly smack into the right leg causing him to fall.  11/17/21: Patient has been admitted with sudden onset of blurred vision and ocular pain which was concerning for temporal arteritis.  Neurology consulted with ongoing work-up pending.  Patient will likely be started on IV Solu-Medrol 1000mg  daily for 3 days if further work-up remains negative.

## 2021-11-17 NOTE — Progress Notes (Signed)
°  Transition of Care [facility] Screening Note   Patient Details  Name: [name] Date of Birth: [birth year]   Transition of Care [facility]) CM/SW Contact:    Boneta Lucks, RN Phone Number: 11/17/2021, 1:21 PM    Transition of Care Department [facility]) has reviewed patient and no TOC needs have been identified at this time. We will continue to monitor patient advancement through interdisciplinary progression rounds. If new patient transition needs arise, please place a TOC consult.

## 2021-11-17 NOTE — Consult Note (Addendum)
I connected with  [name] on 11/17/21 by a video enabled telemedicine application and verified that I am speaking with the correct person using two identifiers.   I discussed the limitations of evaluation and management by telemedicine. The patient expressed understanding and agreed to proceed.  Location of patient: [facility] Location of physician: [facility]  Neurology Consultation Reason for Consult: diplopia Referring Physician: Dr Heath Lark  CC: diplopia  History is obtained from: patient, chart review  HPI: [name] is a 76 y.o. male with past medical history of stroke with left-sided hemiparesis, congestive heart failure, type 2 diabetes, hypertension, hypothyroidism, restless leg who presented with sudden onset of blurry vision and diplopia.   Patient states he was reading on Sunday evening at 9pm when suddenly noticed double vision.  Initially he rubbed his eyes and felt like the double vision improved but it has since persisted.  He reports seeing 2 images side-by-side when both his eyes are open, denies any diplopia when either eyes closed.  Also reports pain in both of his eyes as well as left temporal region.  Patient also reports intermittent right upper extremity paresthesia, happens if patient falls asleep on that side but at times also happens when he is not laying on his right side.  Denies headache, recent travel, tick bite, exposure to cats, new medications, fluctuation in diplopia, muscle weakness, respiratory difficulties, speech changes, vertigo, dizziness, imbalance.  ROS: All other systems reviewed and negative except as noted in the HPI.   Past Medical History:  Diagnosis Date   Aftercare following surgery of the circulatory system, NEC 12/04/2013   Angina decubitus ([facility]) 05/19/2016   Arrhythmia 05/20/2016   Arthritis    ARTHRITIS, RIGHT FOOT 06/26/2008   Qualifier: Diagnosis of  By: Aline Brochure MD, [name]     Cardiomyopathy- EF NL  01/2013, now 30-35% echo 03/14/2012   Carotid artery occlusion    left s/p CEA   Cellulitis of left foot 05/07/2018   CHF (congestive heart failure) (HCC)    Critical lower limb ischemia ([place]) 02/28/2014   Critical limb ischemia    Diabetes mellitus    Elevated troponin 04/11/2016   GERD (gastroesophageal reflux disease)    History of stroke June 2013 03/14/2012   HOH (hard of hearing)    Hypertension    Hypothyroidism    Nonhealing skin ulcer ([place]) 10/28/2014   Obstructive sleep apnea    Orthostatic hypotension    Osteomyelitis (HCC)    left great toe   Pain in the chest 05/17/2016   Peripheral arterial disease ([place]), s/p PTA x 2 RLE    nonhealing ulcers bilaterally on each great toe   Pneumonia    PONV (postoperative nausea and vomiting)    Pulmonary nodule 05/17/2016   Restless leg syndrome    Shortness of breath    Stroke [facility]) March 08, 2012   Toe osteomyelitis, left (HCC)    Ulcer of great toe, left, with necrosis of bone ([place])    Wears dentures     Family History  Problem Relation Age of Onset   Heart disease Mother    Hypertension Mother    Heart attack Mother    Hypertension Father    Diabetes Father    Diabetes Son    Heart disease Son    Hypertension Son    Colon cancer Neg Hx     Social History:  reports that he quit smoking about 44 years ago. His smoking use  included pipe. He has never used smokeless tobacco. He reports that he does not drink alcohol and does not use drugs.   Medications Prior to Admission  Medication Sig Dispense Refill Last Dose   aspirin EC 81 MG EC tablet Take 1 tablet (81 mg total) by mouth daily.   11/15/2021   b complex vitamins tablet Take 1 tablet by mouth daily.   11/15/2021   carvedilol (COREG) 3.125 MG tablet Take 0.5 tablets (1.5625 mg total) by mouth 2 (two) times daily with a meal. 180 tablet 3 11/15/2021 at 0900   Cholecalciferol (VITAMIN D3) 5000 units TABS Take 5,000 Units by mouth daily at 6 (six) AM.   11/15/2021   clopidogrel  (PLAVIX) 75 MG tablet TAKE ONE (1) TABLET BY MOUTH EVERY DAY (Patient taking differently: Take 75 mg by mouth daily.) 90 tablet 0 11/15/2021 at 0900   Cyanocobalamin 1500 MCG TBDP Take 1,500 mcg by mouth daily.    11/15/2021   furosemide (LASIX) 40 MG tablet TAKE ONE TABLET (40MG TOTAL) BY MOUTH TWO TIMES DAILY. (Patient taking differently: Take 40 mg by mouth 2 (two) times daily.) 180 tablet 0 11/15/2021   glimepiride (AMARYL) 4 MG tablet TAKE ONE TABLET (4MG TOTAL) BY MOUTH DAILY WITH BREAKFAST 90 tablet 1 11/15/2021   levothyroxine (SYNTHROID) 88 MCG tablet Take 1 tablet (88 mcg total) by mouth daily. 90 tablet 3 11/15/2021   magnesium 30 MG tablet Take 30 mg by mouth 2 (two) times daily.   11/15/2021   Multiple Vitamin (MULTIVITAMIN WITH MINERALS) TABS tablet Take 1 tablet by mouth daily.   11/15/2021   pantoprazole (PROTONIX) 40 MG tablet TAKE ONE TABLET BY MOUTH ONCE DAILY (Patient taking differently: Take 40 mg by mouth daily.) 90 tablet 1 11/15/2021   potassium chloride (KLOR-CON) 10 MEQ tablet TAKE ONE TABLET (10MEQ TOTAL) BY MOUTH DAILY (Patient taking differently: Take 10 mEq by mouth daily.) 90 tablet 3 11/15/2021   rOPINIRole (REQUIP) 0.5 MG tablet Take 0.5 mg by mouth 3 (three) times daily.   11/15/2021   rosuvastatin (CRESTOR) 20 MG tablet TAKE ONE TABLET (20MG TOTAL) BY MOUTH DAILY (Patient taking differently: Take 20 mg by mouth daily.) 90 tablet 1 11/15/2021   Continuous Blood Gluc Receiver (FREESTYLE LIBRE 14 DAY READER) DEVI       Continuous Blood Gluc Sensor (FREESTYLE LIBRE 14 DAY SENSOR) MISC Inject 1 each into the skin every 14 (fourteen) days. 2 each 5    nitroGLYCERIN (NITROSTAT) 0.4 MG SL tablet Place 1 tablet (0.4 mg total) under the tongue every 5 (five) minutes as needed for chest pain. 25 tablet 3    UNABLE TO FIND Home health supplies for CPAP 1 Product 0    UNABLE TO FIND [place] sock aid to help with support hose. Size XL.  Dx:I73.9, G25.81, R60.9 1 each 0      Exam: Current  vital signs: BP (!) 142/78 (BP Location: Left Arm)    Pulse 65    Temp 98.1 F (36.7 C)    Resp 17    Ht 6' (1.829 m)    Wt 93 kg    SpO2 97%    BMI 27.80 kg/m  Vital signs in last 24 hours: Temp:  [97.7 F (36.5 C)-98.1 F (36.7 C)] 98.1 F (36.7 C) (02/28 1037) Pulse Rate:  [65-76] 65 (02/28 1037) Resp:  [17-19] 17 (02/28 0448) BP: (118-181)/(48-88) 142/78 (02/28 1037) SpO2:  [95 %-100 %] 97 % (02/28 1037)   Physical Exam  Constitutional:  Appears well-developed and well-nourished.  Psych: Affect appropriate to situation Eyes: No scleral injection HENT: No OP obstrucion Head: Normocephalic.  Neuro: AOx3, PERRLA, extraocular movements appear intact, reports binocular diplopia, does not have ptosis on sustained upward gaze, no facial asymmetry, antigravity strength in all 4 extremities without drift, FTN intact bilaterally  I have reviewed labs in epic and the results pertinent to this consultation are: CBC:  Recent Labs  Lab 11/16/21 1311 11/17/21 0515  WBC 5.2 5.2  NEUTROABS 4.0 3.6  HGB 11.1* 10.4*  HCT 33.0* 31.8*  MCV 102.8* 102.3*  PLT 166 031    Basic Metabolic Panel:  Lab Results  Component Value Date   NA 137 11/17/2021   K 3.3 (L) 11/17/2021   CO2 24 11/17/2021   GLUCOSE 88 11/17/2021   BUN 15 11/17/2021   CREATININE 0.72 11/17/2021   CALCIUM 8.5 (L) 11/17/2021   GFRNONAA >60 11/17/2021   GFRAA 84 09/25/2020   Lipid Panel:  Lab Results  Component Value Date   LDLCALC 88 10/06/2021   HgbA1c:  Lab Results  Component Value Date   HGBA1C 6.6 (H) 10/06/2021   Urine Drug Screen: No results found for: LABOPIA, COCAINSCRNUR, LABBENZ, AMPHETMU, THCU, LABBARB  Alcohol Level No results found for: ETH   I have reviewed the images obtained:  MRI brain without contrast 11/16/2021: No acute abnormality. Chronic lacunar infarcts are noted involving the right corona radiata, right greater than left basal ganglia, right internal capsule, and pons  MRI orbit  with and without contrast 11/17/2021: Normal MRI appearance of the Orbits.   ASSESSMENT/PLAN: 76 year old male with sudden onset binocular diplopia since Sunday 11/15/2021.  Binocular diplopia -Differentials include temporal arteritis versus inflammatory causes versus infection versus due to thyroid disease  Recommendations: -We will order CTA and CTV head to look for vasculitis, renal sinus thrombosis -We will order ACE, ANA, anti-DNA, anti-smooth muscle antibodies, Lyme disease DNA, no recent abnormal antibody IgG, protein electrophoresis to look for potential inflammatory and infectious etiologies -If all imaging is negative, will consider starting patient on IV Solu-Medrol 1000 mg daily for 3 days for possible temporal arteritis as patient has temporal artery tenderness even though ESR and CRP are normal  Thank you for allowing [place] to participate in the care of this patient. If you have any further questions, please contact  me or neurohospitalist.   [facility] neuro[facility]ist

## 2021-11-17 NOTE — Progress Notes (Signed)
PROGRESS NOTE    [name]  ZLD:[id number] DOB: [birth year] DOA: 11/16/2021 PCP: Renee Rival, FNP   Brief Narrative:  Per HPI: [name] is a 76 y.o. male with medical history significant of with history of stroke with left-sided deficits, CHF, diabetes mellitus type 2, GERD, hypertension, hypothyroidism, restless leg, and more presents ED with a chief complaint of blurry vision.  Upon my entry into the room, patient reports that he is seeing multiples of myself standing neck.  He reports this was sudden in onset at 57 PM the day prior to presentation.  Patient reports that he was doing paperwork when it started.  He had associated pain over his left eye.  It felt like an intense pressure like his eye would pop out of the socket, with sharp fleeting pains intermittently.  Pain lasted 10 minutes.  Patient reports he rubbed his eye and it went away.  He took Tylenol and that helped a little.  At the time of exam, right eye is hurting.  Feels like a pressure but not as intense as the left eye had.  Patient denies any headache with these episodes.   Patient reports that he is also had difficulty taking his medications and having to drink a lot of water to get the pills to go down recently.  He reports he woke up out of sleep 2 weeks ago coughing and then dry heaving.  That lasted for 15-20 minutes.  He never did have emesis, and the episode has not recurred.  Patient reports at baseline he walks with a walker, lives independently.  He does report recent Botox injections to help with myoclonic movements of left leg.  The left leg will suddenly smack into the right leg causing him to fall.  11/17/21: Patient has been admitted with sudden onset of blurred vision and ocular pain which was concerning for temporal arteritis.  Neurology consulted with ongoing work-up pending.  Patient will likely be started on IV Solu-Medrol 1049m daily for 3 days if further work-up remains negative.     Assessment & Plan:   Principal Problem:   Blurred vision Active Problems:   Hypertension   Type 2 diabetes mellitus with circulatory disorder (HCC)   Restless legs   Myoclonus   Hypothyroidism   Prolonged QT interval   GERD (gastroesophageal reflux disease)  Assessment and Plan: * Blurred vision- (present on admission) Patient was concern for temporal arteritis given blurry vision, and tenderness with palpation of the temples ESR is normal Further work-up per neurology still pending Plan to start on high-dose Solu-Medrol for 3 days if work-up negative  GERD (gastroesophageal reflux disease)- (present on admission) Continue Protonix  Prolonged QT interval- (present on admission) EKG shows QTc 546 Monitor electrolytes and replace as indicated Monitor on Tele Repeat EKG in the AM  Hypothyroidism- (present on admission) Continue Synthroid  Myoclonus- (present on admission) Patient has history of stroke stated left him with residual left-sided deficits.  He reports left-sided contracture, paresthesias of his upper extremity, and myoclonic movements of his left lower extremity that interfere with his ambulation.  For this he recently had Botox injections 3 days ago. Continue to monitor  Restless legs- (present on admission) Continue Requip and try higher dose of 1 mg 3 times daily given persistent symptoms  Type 2 diabetes mellitus with circulatory disorder ([facility]- (present on admission) Hold glimepiride Sliding scale coverage Last hemoglobin A1c was well controlled at 6.6 Continue to monitor  Hypertension- (present on admission)  Holding Coreg at this time Blood pressure well controlled    DVT prophylaxis:SCDs Code Status: Full Family Communication: None at bedside Disposition Plan:  Status is: Observation The patient will require care spanning > 2 midnights and should be moved to inpatient because: Need for IV medications.   Consultants:   Neurology  Procedures:  See below  Antimicrobials:  None   Subjective: Patient seen and evaluated today with initial improvement in his blurred vision this morning, but he is back to having even worse visual blurring and is seeing double of everything.  He is also complaining of restless leg pain symptoms.  Objective: Vitals:   11/17/21 0119 11/17/21 0448 11/17/21 1037 11/17/21 1355  BP: 136/68 (!) 137/56 (!) 142/78 126/84  Pulse: 68 69 65 74  Resp: 19 17    Temp: 98.1 F (36.7 C) 98 F (36.7 C) 98.1 F (36.7 C) (!) 97.5 F (36.4 C)  TempSrc: Oral   Oral  SpO2: 98% 98% 97% 100%  Weight:      Height:        Intake/Output Summary (Last 24 hours) at 11/17/2021 1357 Last data filed at 11/17/2021 0900 Gross per 24 hour  Intake 720 ml  Output 700 ml  Net 20 ml   Filed Weights   11/16/21 1241  Weight: 93 kg    Examination:  General exam: Appears calm and comfortable  Respiratory system: Clear to auscultation. Respiratory effort normal. Cardiovascular system: S1 & S2 heard, RRR.  Gastrointestinal system: Abdomen is soft Central nervous system: Alert and awake Extremities: No edema Skin: No significant lesions noted Psychiatry: Flat affect.    Data Reviewed: I have personally reviewed following labs and imaging studies  CBC: Recent Labs  Lab 11/16/21 1311 11/17/21 0515  WBC 5.2 5.2  NEUTROABS 4.0 3.6  HGB 11.1* 10.4*  HCT 33.0* 31.8*  MCV 102.8* 102.3*  PLT 166 664   Basic Metabolic Panel: Recent Labs  Lab 11/16/21 1311 11/17/21 0515  NA 136 137  K 3.7 3.3*  CL 105 105  CO2 24 24  GLUCOSE 137* 88  BUN 19 15  CREATININE 0.87 0.72  CALCIUM 8.5* 8.5*  MG  --  2.2   GFR: Estimated Creatinine Clearance: 83.5 mL/min (by C-G formula based on SCr of 0.72 mg/dL). Liver Function Tests: Recent Labs  Lab 11/17/21 0515  AST 15  ALT 12  ALKPHOS 49  BILITOT 0.4  PROT 6.4*  ALBUMIN 3.6   No results for input(s): LIPASE, AMYLASE in the last 168  hours. No results for input(s): AMMONIA in the last 168 hours. Coagulation Profile: No results for input(s): INR, PROTIME in the last 168 hours. Cardiac Enzymes: No results for input(s): CKTOTAL, CKMB, CKMBINDEX, TROPONINI in the last 168 hours. BNP (last 3 results) No results for input(s): PROBNP in the last 8760 hours. HbA1C: No results for input(s): HGBA1C in the last 72 hours. CBG: Recent Labs  Lab 11/16/21 2222 11/17/21 0724 11/17/21 1108  GLUCAP 182* 91 160*   Lipid Profile: No results for input(s): CHOL, HDL, LDLCALC, TRIG, CHOLHDL, LDLDIRECT in the last 72 hours. Thyroid Function Tests: Recent Labs    11/17/21 0515  TSH 4.520*   Anemia Panel: No results for input(s): VITAMINB12, FOLATE, FERRITIN, TIBC, IRON, RETICCTPCT in the last 72 hours. Sepsis Labs: No results for input(s): PROCALCITON, LATICACIDVEN in the last 168 hours.  Recent Results (from the past 240 hour(s))  Resp Panel by RT-PCR (Flu A&B, Covid) Nasopharyngeal Swab     Status:  None   Collection Time: 11/16/21  1:01 PM   Specimen: Nasopharyngeal Swab; Nasopharyngeal(NP) swabs in vial transport medium  Result Value Ref Range Status   SARS Coronavirus 2 by RT PCR NEGATIVE NEGATIVE Final    Comment: (NOTE) SARS-CoV-2 target nucleic acids are NOT DETECTED.  The SARS-CoV-2 RNA is generally detectable in upper respiratory specimens during the acute phase of infection. The lowest concentration of SARS-CoV-2 viral copies this assay can detect is 138 copies/mL. A negative result does not preclude SARS-Cov-2 infection and should not be used as the sole basis for treatment or other patient management decisions. A negative result may occur with  improper specimen collection/handling, submission of specimen other than nasopharyngeal swab, presence of viral mutation(s) within the areas targeted by this assay, and inadequate number of viral copies(<138 copies/mL). A negative result must be combined with clinical  observations, patient history, and epidemiological information. The expected result is Negative.  Fact Sheet for Patients:  EntrepreneurPulse.com.au  Fact Sheet for Healthcare Providers:  IncredibleEmployment.be  This test is no t yet approved or cleared by the [place] FDA and  has been authorized for detection and/or diagnosis of SARS-CoV-2 by FDA under an Emergency Use Authorization (EUA). This EUA will remain  in effect (meaning this test can be used) for the duration of the COVID-19 declaration under Section 564(b)(1) of the Act, 21 U.S.C.section 360bbb-3(b)(1), unless the authorization is terminated  or revoked sooner.       Influenza A by PCR NEGATIVE NEGATIVE Final   Influenza B by PCR NEGATIVE NEGATIVE Final    Comment: (NOTE) The Xpert Xpress SARS-CoV-2/FLU/RSV plus assay is intended as an aid in the diagnosis of influenza from Nasopharyngeal swab specimens and should not be used as a sole basis for treatment. Nasal washings and aspirates are unacceptable for Xpert Xpress SARS-CoV-2/FLU/RSV testing.  Fact Sheet for Patients: EntrepreneurPulse.com.au  Fact Sheet for Healthcare Providers: IncredibleEmployment.be  This test is not yet approved or cleared by the [place] FDA and has been authorized for detection and/or diagnosis of SARS-CoV-2 by FDA under an Emergency Use Authorization (EUA). This EUA will remain in effect (meaning this test can be used) for the duration of the COVID-19 declaration under Section 564(b)(1) of the Act, 21 U.S.C. section 360bbb-3(b)(1), unless the authorization is terminated or revoked.  Performed at [facility], [address], [place], [place] [address]          Radiology Studies: CT ANGIO HEAD NECK W WO CM  Result Date: 11/17/2021 CLINICAL DATA:  Blurry vision, palpitations 2 temporal area EXAM: CT ANGIOGRAPHY HEAD AND NECK TECHNIQUE:  Multidetector CT imaging of the head and neck was performed using the standard protocol during bolus administration of intravenous contrast. Multiplanar CT image reconstructions and MIPs were obtained to evaluate the vascular anatomy. Carotid stenosis measurements (when applicable) are obtained utilizing NASCET criteria, using the distal internal carotid diameter as the denominator. RADIATION DOSE REDUCTION: This exam was performed according to the departmental dose-optimization program which includes automated exposure control, adjustment of the mA and/or kV according to patient size and/or use of iterative reconstruction technique. CONTRAST:  6m OMNIPAQUE IOHEXOL 350 MG/ML SOLN COMPARISON:  MR head dated 1 day prior, MR orbits obtained earlier the same day cervical spine CT 12/12/2020, CTA neck 11/19/2013 FINDINGS: CT HEAD FINDINGS Brain: There is no evidence of acute intracranial hemorrhage, extra-axial fluid collection, or acute infarct. Parenchymal volume is normal. The ventricles are normal in size. Small remote infarcts in the bilateral basal ganglia,  right more than left, and right corona radiata are again seen. Additional hypodensity in the subcortical and periventricular white matter likely reflects sequela of chronic white matter microangiopathy. There is no mass lesion.  There is no mass effect or midline shift. Vascular: See below. Skull: Normal. Negative for fracture or focal lesion. Sinuses: The paranasal sinuses are clear. Orbits: Bilateral lens implants are in place. The globes and orbits are otherwise unremarkable. Review of the MIP images confirms the above findings CTA NECK FINDINGS Aortic arch: There is calcified atherosclerotic plaque in the aortic arch. The origins of the major branch vessels are patent. The subclavian arteries are patent to the level imaged. Right carotid system: The right common carotid artery is patent. There is mixed plaque in the proximal right internal carotid artery  resulting in approximally 10-20% stenosis. The distal right internal carotid artery is patent. The right external carotid artery is patent. There is no evidence of dissection or aneurysm. Left carotid system: The left common carotid artery is patent. There is mild plaque in the proximal left internal carotid artery without hemodynamically significant stenosis. The distal left internal carotid artery is patent. The left external carotid artery is patent. There is no evidence of dissection or aneurysm. Vertebral arteries: Vertebral arteries are patent, without hemodynamically significant stenosis or occlusion. There is no evidence of dissection or aneurysm. Skeleton: The patient is status post C3-C4 ACDF. There is advanced adjacent segment disease at C4-C5 and at C5-C6. There is ossification of the posterior longitudinal ligament centered at C4-C5 resulting in at least moderate spinal canal stenosis. There is no visible canal hematoma. There is no acute osseous abnormality or aggressive osseous lesion. Other neck: There is asymmetric enlargement of the left piriform sinus and laryngeal ventricle, similar to prior CT from 12/12/2020. The soft tissues are otherwise unremarkable. Upper chest: The imaged lung apices are clear. Review of the MIP images confirms the above findings CTA HEAD FINDINGS Anterior circulation: There is mild calcified atherosclerotic plaque in the intracranial ICAs resulting in up to mild stenosis bilaterally. The ophthalmic arteries are identified bilaterally. There is mild irregularity of the right M1 segment, favored to reflect atherosclerotic disease, resulting in focal mild-to-moderate stenosis (11-90, 10-112). The distal right MCA branches are patent, with mild irregularity distally also favored to reflect atherosclerotic disease. The left MCA is patent with mild irregularity of the distal branches. The bilateral ACAs are patent. The anterior communicating artery is patent. There is a 2 mm  aneurysm arising from the anterior communicating artery (9-99, 10-104). Posterior circulation: The right V4 segment is diminutive compared to the left, likely a developmental variant. PICA is identified bilaterally. There is focal moderate to severe stenosis of the left V4 segment just prior to the vertebrobasilar junction (10-125, 13-32, 11-113). The basilar artery is patent, without significant irregularity. There is focal moderate to severe stenosis of the right P2 segment (10-125, 11-94). The distal right PCA is patent. The left PCA is patent, without significant stenosis or occlusion. There is no aneurysm or AVM. Venous sinuses: Patent. Anatomic variants: None. Review of the MIP images confirms the above findings IMPRESSION: 1. Mild atherosclerotic plaque at the carotid bifurcations without hemodynamically significant stenosis. Patent vertebral arteries in the neck. 2. Intracranial atherosclerotic disease resulting in mild-to-moderate stenosis of the right M1 segment, moderate to severe stenosis of the left V4 segment, and moderate to severe stenosis of the right P2 segment. No convincing CT findings of vasculitis in the intracranial vasculature. 3. 2 mm anterior communicating artery  aneurysm. 4. Asymmetric enlargement of the left piriform sinus and laryngeal ventricle can be seen with left vocal cord paralysis, similar in appearance to a CT cervical spine from 12/12/2020. Correlate with history and symptoms. 5. Status post ACDF at C3-C4. Advanced degenerative changes at C4-C5 and C5-C6 including bulky ossification of the posterior longitudinal ligament result in at least moderate spinal canal stenosis at C4-C5. Aortic Atherosclerosis (ICD10-I70.0). Electronically Signed   By: Valetta Mole M.D.   On: 11/17/2021 13:46   DG Chest 2 View  Result Date: 11/16/2021 CLINICAL DATA:  Infection evaluation. EXAM: CHEST - 2 VIEW COMPARISON:  May 16, 2021 FINDINGS: The cardiomediastinal silhouette is stable. No  pneumothorax. No nodules or masses. No focal infiltrates. Mild atelectasis in the left base. No acute abnormalities. IMPRESSION: No active cardiopulmonary disease. Electronically Signed   By: Dorise Bullion III M.D.   On: 11/16/2021 14:12   CT Head Wo Contrast  Result Date: 11/16/2021 CLINICAL DATA:  Mental status change, headaches and double vision. EXAM: CT HEAD WITHOUT CONTRAST TECHNIQUE: Contiguous axial images were obtained from the base of the skull through the vertex without intravenous contrast. RADIATION DOSE REDUCTION: This exam was performed according to the departmental dose-optimization program which includes automated exposure control, adjustment of the mA and/or kV according to patient size and/or use of iterative reconstruction technique. COMPARISON:  05/06/2021 FINDINGS: Brain: Stable age related cerebral atrophy, ventriculomegaly and periventricular white matter disease. Remote basal ganglia and deep white matter infarcts on the right. No extra-axial fluid collections are identified. No CT findings for acute hemispheric infarction or intracranial hemorrhage. No mass lesions. The brainstem and cerebellum are normal. Vascular: Stable vascular calcifications. No aneurysm or hyperdense vessels. Skull: No skull fracture or bone lesions. Sinuses/Orbits: The paranasal sinuses and mastoid air cells are clear. The globes are intact. Other: No scalp lesions or scalp hematoma. IMPRESSION: 1. Remote/chronic brain changes. 2. No acute intracranial findings or mass lesion. Electronically Signed   By: Marijo Sanes M.D.   On: 11/16/2021 14:06   MR BRAIN WO CONTRAST  Result Date: 11/16/2021 CLINICAL DATA:  Neuro deficit, acute, stroke suspected. Blurred vision left eye, paresthesias right arm. EXAM: MRI HEAD WITHOUT CONTRAST TECHNIQUE: Multiplanar, multiecho pulse sequences of the brain and surrounding structures were obtained without intravenous contrast. COMPARISON:  Head CT 11/16/2021 and MRI  03/10/2012 FINDINGS: Brain: There is no evidence of an acute infarct, intracranial hemorrhage, mass, midline shift, or extra-axial fluid collection. T2 hyperintensities in the cerebral white matter and pons have progressed from the prior MRI and are nonspecific but compatible with mild chronic small vessel ischemic disease. Chronic lacunar infarcts are noted involving the right corona radiata, right greater than left basal ganglia, right internal capsule, and pons. There is mild cerebral atrophy. Vascular: Major intracranial vascular flow voids are preserved. Skull and upper cervical spine: Unremarkable bone marrow signal. Sinuses/Orbits: Bilateral cataract extraction. Mild mucosal thickening in the left maxillary sinus. Small right mastoid effusion. Other: None. IMPRESSION: 1. No acute intracranial abnormality. 2. Chronic small vessel ischemic disease with lacunar infarcts as above. Electronically Signed   By: Logan Bores M.D.   On: 11/16/2021 18:40   CT VENOGRAM HEAD  Result Date: 11/17/2021 CLINICAL DATA:  Blurry vision, palpitations and temporal area, concern for venous sinus thrombosis. EXAM: CT VENOGRAM HEAD TECHNIQUE: Venographic phase images of the brain were obtained following the administration of intravenous contrast. Multiplanar reformats and maximum intensity projections were generated. RADIATION DOSE REDUCTION: This exam was performed according to the departmental  dose-optimization program which includes automated exposure control, adjustment of the mA and/or kV according to patient size and/or use of iterative reconstruction technique. CONTRAST:  37m OMNIPAQUE IOHEXOL 350 MG/ML SOLN COMPARISON:  Same-day CT/CTA head and neck, orbit MRI obtained earlier the same day, brain MRI 1 day prior. FINDINGS: Contiguous axial images were obtained from the base of the skull through the vertex during the bolus administration of intravenous contrast using CTV timing. Multiplanar CT image reconstructions and  MIPs were obtained to evaluate the venous anatomy. IMPRESSION: No evidence of venous sinus thrombosis. Electronically Signed   By: PValetta MoleM.D.   On: 11/17/2021 13:50   MR ORBITS W WO CONTRAST  Result Date: 11/17/2021 CLINICAL DATA:  76year old male with left eye blurred vision. EXAM: MRI OF THE ORBITS WITHOUT AND WITH CONTRAST TECHNIQUE: Multiplanar, multi-echo pulse sequences of the orbits and surrounding structures were acquired including fat saturation techniques, before and after intravenous contrast administration. CONTRAST:  164mGADAVIST GADOBUTROL 1 MMOL/ML IV SOLN COMPARISON:  Brain MRI 11/16/2021. FINDINGS: Orbits: Normal suprasellar cistern. Cavernous sinus appears within normal limits. Normal optic chiasm. Bilateral optic nerves appear symmetric without abnormal enhancement. No superimposed intraorbital mass or inflammation. Extraocular muscles and lacrimal glands appear negative. Postoperative changes to both globes. Visualized sinuses: Trace paranasal sinus mucosal thickening and mastoid fluid unchanged from the recent MRI and significance doubtful. Soft tissues: Negative visible periorbital and face soft tissues. Limited intracranial: Stable from the MRI yesterday. Chronic small-vessel ischemia more pronounced in the right hemisphere. No abnormal enhancement or dural thickening identified. Other Previous cervical ACDF. Visualized bone marrow signal is within normal limits. IMPRESSION: 1. Normal MRI appearance of the Orbits. 2. Stable appearance of the visible Brain from the MRI yesterday. Electronically Signed   By: H Genevie Ann.D.   On: 11/17/2021 10:37        Scheduled Meds:  aspirin EC  81 mg Oral Daily   furosemide  40 mg Oral BID   insulin aspart  0-9 Units Subcutaneous TID WC   levothyroxine  88 mcg Oral Daily   pantoprazole  40 mg Oral Daily   rOPINIRole  0.5 mg Oral TID   rosuvastatin  20 mg Oral Daily     LOS: 0 days    Time spent: 35 minutes    Kathyrn Warmuth D Darleen Crocker DO [facility]  If 7PM-7AM, please contact night-coverage [email or web address] 11/17/2021, 1:57 PM

## 2021-11-17 NOTE — Care Management Obs Status (Signed)
[place] NOTIFICATION   Patient Details  Name: [name] MRN: [id number] Date of Birth: [birth year]   Medicare Observation Status Notification Given:  Yes    Tommy Medal 11/17/2021, 1:32 PM

## 2021-11-18 DIAGNOSIS — I639 Cerebral infarction, unspecified: Secondary | ICD-10-CM | POA: Diagnosis not present

## 2021-11-18 DIAGNOSIS — H538 Other visual disturbances: Secondary | ICD-10-CM | POA: Diagnosis not present

## 2021-11-18 DIAGNOSIS — I10 Essential (primary) hypertension: Secondary | ICD-10-CM | POA: Diagnosis not present

## 2021-11-18 DIAGNOSIS — E039 Hypothyroidism, unspecified: Secondary | ICD-10-CM | POA: Diagnosis not present

## 2021-11-18 DIAGNOSIS — K219 Gastro-esophageal reflux disease without esophagitis: Secondary | ICD-10-CM | POA: Diagnosis not present

## 2021-11-18 LAB — ANGIOTENSIN CONVERTING ENZYME: Angiotensin-Converting Enzyme: 26 U/L (ref 14–82)

## 2021-11-18 LAB — GLUCOSE, CAPILLARY
Glucose-Capillary: 240 mg/dL — ABNORMAL HIGH (ref 70–99)
Glucose-Capillary: 262 mg/dL — ABNORMAL HIGH (ref 70–99)
Glucose-Capillary: 272 mg/dL — ABNORMAL HIGH (ref 70–99)
Glucose-Capillary: 310 mg/dL — ABNORMAL HIGH (ref 70–99)
Glucose-Capillary: 285 mg/dL — ABNORMAL HIGH (ref 70–99)
Glucose-Capillary: 210 mg/dL — ABNORMAL HIGH (ref 70–99)

## 2021-11-18 LAB — ANTI-DNA ANTIBODY, DOUBLE-STRANDED: ds DNA Ab: <1 IU/mL (ref 0–9)

## 2021-11-18 LAB — BASIC METABOLIC PANEL
Anion gap: 10 (ref 5–15)
BUN: 20 mg/dL (ref 8–23)
CO2: 23 mmol/L (ref 22–32)
Calcium: 8.9 mg/dL (ref 8.9–10.3)
Chloride: 99 mmol/L (ref 98–111)
Creatinine, Ser: 0.89 mg/dL (ref 0.61–1.24)
GFR, Estimated: >60 mL/min (ref >60)
Glucose, Bld: 291 mg/dL — ABNORMAL HIGH (ref 70–99)
Potassium: 3.8 mmol/L (ref 3.5–5.1)
Sodium: 132 mmol/L — ABNORMAL LOW (ref 135–145)

## 2021-11-18 LAB — CBC
HCT: 33.8 % — ABNORMAL LOW (ref 39.0–52.0)
Hemoglobin: 11.1 g/dL — ABNORMAL LOW (ref 13.0–17.0)
MCH: 32.7 pg (ref 26.0–34.0)
MCHC: 32.8 g/dL (ref 30.0–36.0)
MCV: 99.7 fL (ref 80.0–100.0)
Platelets: 172 10*3/uL (ref 150–400)
RBC: 3.39 MIL/uL — ABNORMAL LOW (ref 4.22–5.81)
RDW: 15 % (ref 11.5–15.5)
WBC: 8.6 10*3/uL (ref 4.0–10.5)
nRBC: 0 % (ref 0.0–0.2)

## 2021-11-18 LAB — MAGNESIUM: Magnesium: 2.2 mg/dL (ref 1.7–2.4)

## 2021-11-18 LAB — URINE CULTURE: Culture: NO GROWTH

## 2021-11-18 LAB — ANTI-SMOOTH MUSCLE ANTIBODY, IGG: F-Actin IgG: 15 Units (ref 0–19)

## 2021-11-18 LAB — ANA W/REFLEX IF POSITIVE: Anti Nuclear Antibody (ANA): NEGATIVE

## 2021-11-18 LAB — FLUORESCENT TREPONEMAL AB(FTA)-IGG-BLD: Fluorescent Treponemal Ab, IgG: NONREACTIVE

## 2021-11-18 MED ORDER — ASPIRIN EC 325 MG PO TBEC: 325.0000 mg | DELAYED_RELEASE_TABLET | Freq: Every day | ORAL | Status: DC

## 2021-11-18 MED ORDER — ASPIRIN 325 MG PO TBEC: 325.0000 mg | DELAYED_RELEASE_TABLET | Freq: Every day | ORAL | 3 refills | Status: DC

## 2021-11-18 NOTE — Progress Notes (Signed)
I connected with  [name] on 11/18/21 by a video enabled telemedicine application and verified that I am speaking with the correct person using two identifiers. ?  ?I discussed the limitations of evaluation and management by telemedicine. The patient expressed understanding and agreed to proceed. ? ?Location of patient: [facility] ?Location of physician: [facility] ? ?Subjective: Patient states her symptoms are slightly better.  However continues to report mild headache and double vision. ? ?ROS: negative except above ?Examination ? ?Vital signs in last 24 hours: ?Temp:  [97.6 ?F (36.4 ?C)-98 ?F (36.7 ?C)] 98 ?F (36.7 ?C) (03/01 0518) ?Pulse Rate:  [78-83] 83 (03/01 0518) ?Resp:  [19-20] 20 (03/01 0518) ?BP: (124-127)/(64-70) 127/64 (03/01 0518) ?SpO2:  [92 %-96 %] 92 % (03/01 0518) ? ?General: lying in bed, NAD ?Neuro: Of note, exam is limited due to tele, AOx3, no evidence of aphasia.  Patient reports horizontal diplopia which is worse on the right gaze today.  He appears to have subtle nystagmus in the left eye on the right gaze deviation and limited abduction of right eye. No facial asymmetry, antigravity strength in all 4 extremities without drift, FTN intact bilaterally ? ?Basic Metabolic Panel: ?Recent Labs  ?Lab 11/16/21 ?1311 11/17/21 ?9935 11/18/21 ?0515  ?NA 136 137 132*  ?K 3.7 3.3* 3.8  ?CL 105 105 99  ?CO2 24 24 23   ?GLUCOSE 137* 88 291*  ?BUN 19 15 20   ?CREATININE 0.87 0.72 0.89  ?CALCIUM 8.5* 8.5* 8.9  ?MG  --  2.2 2.2  ? ? ?CBC: ?Recent Labs  ?Lab 11/16/21 ?1311 11/17/21 ?7017 11/18/21 ?0515  ?WBC 5.2 5.2 8.6  ?NEUTROABS 4.0 3.6  --   ?HGB 11.1* 10.4* 11.1*  ?HCT 33.0* 31.8* 33.8*  ?MCV 102.8* 102.3* 99.7  ?PLT 166 150 172  ? ? ? ?Coagulation Studies: ?No results for input(s): LABPROT, INR in the last 72 hours. ? ?Imaging ? ?CT angio head and neck with and without contrast 11/17/2021 ?1.Mild atherosclerotic plaque at the carotid bifurcations without hemodynamically  significant stenosis. Patent vertebral arteries in ?the neck.  ?2. Intracranial atherosclerotic disease resulting in mild-to-moderate stenosis of the right M1 segment, moderate to severe stenosis of the left V4 segment, and moderate to severe stenosis of the right P2 segment. No convincing CT findings of vasculitis in the intracranial vasculature. ?3. 2 mm anterior communicating artery aneurysm. ?4. Asymmetric enlargement of the left piriform sinus and laryngeal ?ventricle can be seen with left vocal cord paralysis, similar in ?appearance to a CT cervical spine from 12/12/2020. Correlate with ?history and symptoms. ?5. Status post ACDF at C3-C4. Advanced degenerative changes at C4-C5 ?and C5-C6 including bulky ossification of the posterior longitudinal ?ligament result in at least moderate spinal canal stenosis at C4-C5. ? Aortic Atherosclerosis ? ?CTV head with contrast 11/17/2021:No evidence of venous sinus thrombosis. ? ?ASSESSMENT AND PLAN:  76 year old male with sudden onset binocular diplopia since Sunday 11/15/2021. ?  ?Binocular diplopia ?Headache ?Suspected brainstem stroke not visible on MRI ?-Patient most likely had a small brainstem stroke which is not visible on MRI. ?- NIHSS 1 ( partial gaze palsy), no tPA as outside window, no thrombectomy as no large vessel occlusion.  Symptoms happened at home on Sunday 11/15/2026 at 9 pm ?-Patient received 1 dose of IV Solu-Medrol.  However I suspect his symptoms are more likely due to stroke rather than temporal arteritis and the headache is secondary to diplopia. ?-Continue Plavix 75 mg daily for 3 months.  Increase aspirin  to 325 mg daily ?-Continue rosuvastatin 20 mg daily ?-Continue management of risk factors including hypertension, diabetes ?-Can use eye patch for symptomatic relief of diplopia ?-Recommend follow-up with neuro-ophthalmology or neurology in 4 to 8 weeks ? ?Thank you for allowing [place] to participate in the care of this patient.  Neurology will sign  off.  If you have any further questions, please contact  me or neurohospitalist.  ? ? ?Zeb Comfort ?Epilepsy ?[facility] ?For questions after 5pm please refer to AMION to reach the Neurologist on call ? ?

## 2021-11-18 NOTE — Discharge Summary (Signed)
Physician Discharge Summary   Patient: [name] MRN: [id number] DOB: 1946/02/08  Admit date:     11/16/2021  Discharge date: 11/18/21  Discharge Physician: Barton Dubois   PCP: Renee Rival, FNP   Recommendations at discharge:  Repeat basic metabolic panel to follow ultralights and renal function Reassess to assure resolution/improvement in patient's diplopia symptoms. Reassess blood pressure and adjust antihypertensive treatment as needed.  Discharge Diagnoses: Principal Problem:   Blurred vision Active Problems:   Hypertension   Type 2 diabetes mellitus with circulatory disorder (HCC)   Restless legs   Myoclonus   Hypothyroidism   Prolonged QT interval   GERD (gastroesophageal reflux disease)  Hospital Course: Per HPI: [name] is a 76 y.o. male with medical history significant of with history of stroke with left-sided deficits, CHF, diabetes mellitus type 2, GERD, hypertension, hypothyroidism, restless leg, and more presents ED with a chief complaint of blurry vision.  Upon my entry into the room, patient reports that he is seeing multiples of myself standing neck.  He reports this was sudden in onset at 37 PM the day prior to presentation.  Patient reports that he was doing paperwork when it started.  He had associated pain over his left eye.  It felt like an intense pressure like his eye would pop out of the socket, with sharp fleeting pains intermittently.  Pain lasted 10 minutes.  Patient reports he rubbed his eye and it went away.  He took Tylenol and that helped a little.  At the time of exam, right eye is hurting.  Feels like a pressure but not as intense as the left eye had.  Patient denies any headache with these episodes.   Patient reports that he is also had difficulty taking his medications and having to drink a lot of water to get the pills to go down recently.  He reports he woke up out of sleep 2 weeks ago coughing and then dry heaving.  That lasted  for 15-20 minutes.  He never did have emesis, and the episode has not recurred.  Patient reports at baseline he walks with a walker, lives independently.  He does report recent Botox injections to help with myoclonic movements of left leg.  The left leg will suddenly smack into the right leg causing him to fall.  11/17/21: Patient has been admitted with sudden onset of blurred vision and ocular pain which was concerning for temporal arteritis.  Neurology consulted with ongoing work-up pending.  Patient will likely be started on IV Solu-Medrol $RemoveBefore'1000mg'kxuCjRmCeGVDu$  daily for 3 days if further work-up remains negative.  11/18/21: Work-up completely negative for temporal arteritis suspicious.  After discussing with neurology, there is concerns for a small brainstem infarct responsible for patient's symptoms.  Patient reporting some improvement in his diplopia.  Still having mild headaches.  Will discharge home on full dose aspirin and Plavix for 3 months; plan is that patient will continue full dose aspirin.  Outpatient follow-up with neurology service.  Assessment and Plan: * Blurred vision- (present on admission) -There was initial concerns for temporal arteritis given blurry vision, and tenderness with palpation of the temples. -ESR normal, CRP normal; images and angiogram demonstrating no vasculitis process -There is concern for brainstem and small infarct not visualized on patient's images. -Case has been discussed with neurology service; at this moment there is no plan or need for high-dose steroids and will treat with full dose aspirin and Plavix for 3 months and outpatient follow-up with  neurology. -Patient can continue as needed pain medications for his ongoing mild headache and continue the use of an eye patch to assist with diplopia symptoms.  GERD (gastroesophageal reflux disease)- (present on admission) -Continue Protonix  Prolonged QT interval- (present on admission) -EKG shows QTc 546 -Minimize the use  of medications that can prolong QT -Stable telemetry monitoring appreciated during hospitalization. -Follow basic metabolic panels and magnesium level to assure stability of electrolytes.  (Goal is for potassium more than 4 and magnesium more than 2).  Hypothyroidism- (present on admission) -Continue Synthroid  Myoclonus- (present on admission) -Patient has history of stroke stated left him with residual left-sided deficits.   -He reports left-sided contracture, paresthesias of his upper extremity, and myoclonic movements of his left lower extremity that interfere with his ambulation. For this he recently had Botox injections and is actively follow as an outpatient for it. -Continue outpatient treatment.   Restless legs- (present on admission) -Continue Requip and follow-up with neurology as an outpatient.  Type 2 diabetes mellitus with circulatory disorder ([place])- (present on admission) -Resume home glimepiride -Last hemoglobin A1c was well controlled at 6.6 -Continue to follow modified carbohydrate diet. -Continue outpatient follow-up with PCP to further adjust hypoglycemic regimen as required.  Hypertension- (present on admission) - Stable vital signs -Resume home antihypertensive agents -Continue heart healthy diet.   Consultants: Neurology service. Procedures performed: See below for x-ray reports. Disposition: Discharged home with instruction to follow-up with PCP and neurology as an outpatient.  Patient improved and in stable conditions.  Diet recommendation: Heart healthy modified carbohydrate diet.  DISCHARGE MEDICATION: Allergies as of 11/18/2021       Reactions   Codeine Nausea And Vomiting   Tramadol Nausea Only        Medication List     TAKE these medications    aspirin 325 MG EC tablet Take 1 tablet (325 mg total) by mouth daily. Start taking on: November 19, 2021 What changed:  medication strength how much to take   b complex vitamins tablet Take 1  tablet by mouth daily.   carvedilol 3.125 MG tablet Commonly known as: COREG Take 0.5 tablets (1.5625 mg total) by mouth 2 (two) times daily with a meal.   clopidogrel 75 MG tablet Commonly known as: PLAVIX TAKE ONE (1) TABLET BY MOUTH EVERY DAY What changed: how much to take   Cyanocobalamin 1500 MCG Tbdp Take 1,500 mcg by mouth daily.   FreeStyle Libre 14 Day Reader Common [facility] 14 Day Sensor Misc Inject 1 each into the skin every 14 (fourteen) days.   furosemide 40 MG tablet Commonly known as: LASIX TAKE ONE TABLET ($RemoveBef'40MG'bxDClTYtgX$  TOTAL) BY MOUTH TWO TIMES DAILY. What changed: See the new instructions.   glimepiride 4 MG tablet Commonly known as: AMARYL TAKE ONE TABLET ($RemoveBef'4MG'QcZxdxqmCi$  TOTAL) BY MOUTH DAILY WITH BREAKFAST   levothyroxine 88 MCG tablet Commonly known as: SYNTHROID Take 1 tablet (88 mcg total) by mouth daily.   magnesium 30 MG tablet Take 30 mg by mouth 2 (two) times daily.   multivitamin with minerals Tabs tablet Take 1 tablet by mouth daily.   nitroGLYCERIN 0.4 MG SL tablet Commonly known as: Nitrostat Place 1 tablet (0.4 mg total) under the tongue every 5 (five) minutes as needed for chest pain.   pantoprazole 40 MG tablet Commonly known as: PROTONIX TAKE ONE TABLET BY MOUTH ONCE DAILY   potassium chloride 10 MEQ tablet Commonly known as: KLOR-CON TAKE ONE TABLET (10MEQ TOTAL) BY MOUTH  DAILY What changed: See the new instructions.   rOPINIRole 0.5 MG tablet Commonly known as: REQUIP Take 0.5 mg by mouth 3 (three) times daily.   rosuvastatin 20 MG tablet Commonly known as: CRESTOR TAKE ONE TABLET ($RemoveBef'20MG'cvFNNtotBI$  TOTAL) BY MOUTH DAILY What changed: See the new instructions.   UNABLE TO FIND Home health supplies for CPAP   UNABLE TO FIND [place] sock aid to help with support hose. Size XL.  Dx:I73.9, G25.81, R60.9   Vitamin D3 125 MCG (5000 UT) Tabs Take 5,000 Units by mouth daily at 6 (six) AM.        Follow-up Information     Paseda, Dewaine Conger, FNP. Schedule an appointment as soon as possible for a visit in 10 day(s).   Specialty: Nurse Practitioner Contact information: [address] [place] [address] [place] [address] [phone]         [name], MD .   Specialties: Cardiology, Radiology Contact information: [address] [place] [address] [phone]         Phillips Odor, MD. Schedule an appointment as soon as possible for a visit in 4 week(s).   Specialty: Neurology Contact information: [place] [address] [phone]                 Discharge Exam: Danley Danker Weights   11/16/21 1241  Weight: 93 kg   General exam: Alert, awake, oriented x 3; still reporting intermittent episode of diplopia and headache.  No chest pain, no fever, no nausea, no vomiting.  Overall feels that his condition is improving. Respiratory system: Clear to auscultation. Respiratory effort normal.  Good saturation on room air. Cardiovascular system:RRR. No murmurs, rubs, gallops.  No JVD. Gastrointestinal system: Abdomen is nondistended, soft and nontender. No organomegaly or masses felt. Normal bowel sounds heard. Central nervous system: Alert and oriented. No new focal neurological deficits. Extremities: No cyanosis, clubbing or edema. Skin: No petechiae. Psychiatry: Judgement and insight appear normal. Mood & affect appropriate.    Condition at discharge: Stable and improved.  Patient has been discharged home.  The results of significant diagnostics from this hospitalization (including imaging, microbiology, ancillary and laboratory) are listed below for reference.   Imaging Studies: CT ANGIO HEAD NECK W WO CM  Result Date: 11/17/2021 CLINICAL DATA:  Blurry vision, palpitations 2 temporal area EXAM: CT ANGIOGRAPHY HEAD AND NECK TECHNIQUE: Multidetector CT imaging of the head and neck was performed using the standard protocol during bolus administration of intravenous  contrast. Multiplanar CT image reconstructions and MIPs were obtained to evaluate the vascular anatomy. Carotid stenosis measurements (when applicable) are obtained utilizing NASCET criteria, using the distal internal carotid diameter as the denominator. RADIATION DOSE REDUCTION: This exam was performed according to the departmental dose-optimization program which includes automated exposure control, adjustment of the mA and/or kV according to patient size and/or use of iterative reconstruction technique. CONTRAST:  24mL OMNIPAQUE IOHEXOL 350 MG/ML SOLN COMPARISON:  MR head dated 1 day prior, MR orbits obtained earlier the same day cervical spine CT 12/12/2020, CTA neck 11/19/2013 FINDINGS: CT HEAD FINDINGS Brain: There is no evidence of acute intracranial hemorrhage, extra-axial fluid collection, or acute infarct. Parenchymal volume is normal. The ventricles are normal in size. Small remote infarcts in the bilateral basal ganglia, right more than left, and right corona radiata are again seen. Additional hypodensity in the subcortical and periventricular white matter likely reflects sequela of chronic white matter microangiopathy. There is no mass lesion.  There is  no mass effect or midline shift. Vascular: See below. Skull: Normal. Negative for fracture or focal lesion. Sinuses: The paranasal sinuses are clear. Orbits: Bilateral lens implants are in place. The globes and orbits are otherwise unremarkable. Review of the MIP images confirms the above findings CTA NECK FINDINGS Aortic arch: There is calcified atherosclerotic plaque in the aortic arch. The origins of the major branch vessels are patent. The subclavian arteries are patent to the level imaged. Right carotid system: The right common carotid artery is patent. There is mixed plaque in the proximal right internal carotid artery resulting in approximally 10-20% stenosis. The distal right internal carotid artery is patent. The right external carotid artery is  patent. There is no evidence of dissection or aneurysm. Left carotid system: The left common carotid artery is patent. There is mild plaque in the proximal left internal carotid artery without hemodynamically significant stenosis. The distal left internal carotid artery is patent. The left external carotid artery is patent. There is no evidence of dissection or aneurysm. Vertebral arteries: Vertebral arteries are patent, without hemodynamically significant stenosis or occlusion. There is no evidence of dissection or aneurysm. Skeleton: The patient is status post C3-C4 ACDF. There is advanced adjacent segment disease at C4-C5 and at C5-C6. There is ossification of the posterior longitudinal ligament centered at C4-C5 resulting in at least moderate spinal canal stenosis. There is no visible canal hematoma. There is no acute osseous abnormality or aggressive osseous lesion. Other neck: There is asymmetric enlargement of the left piriform sinus and laryngeal ventricle, similar to prior CT from 12/12/2020. The soft tissues are otherwise unremarkable. Upper chest: The imaged lung apices are clear. Review of the MIP images confirms the above findings CTA HEAD FINDINGS Anterior circulation: There is mild calcified atherosclerotic plaque in the intracranial ICAs resulting in up to mild stenosis bilaterally. The ophthalmic arteries are identified bilaterally. There is mild irregularity of the right M1 segment, favored to reflect atherosclerotic disease, resulting in focal mild-to-moderate stenosis (11-90, 10-112). The distal right MCA branches are patent, with mild irregularity distally also favored to reflect atherosclerotic disease. The left MCA is patent with mild irregularity of the distal branches. The bilateral ACAs are patent. The anterior communicating artery is patent. There is a 2 mm aneurysm arising from the anterior communicating artery (9-99, 10-104). Posterior circulation: The right V4 segment is diminutive  compared to the left, likely a developmental variant. PICA is identified bilaterally. There is focal moderate to severe stenosis of the left V4 segment just prior to the vertebrobasilar junction (10-125, 13-32, 11-113). The basilar artery is patent, without significant irregularity. There is focal moderate to severe stenosis of the right P2 segment (10-125, 11-94). The distal right PCA is patent. The left PCA is patent, without significant stenosis or occlusion. There is no aneurysm or AVM. Venous sinuses: Patent. Anatomic variants: None. Review of the MIP images confirms the above findings IMPRESSION: 1. Mild atherosclerotic plaque at the carotid bifurcations without hemodynamically significant stenosis. Patent vertebral arteries in the neck. 2. Intracranial atherosclerotic disease resulting in mild-to-moderate stenosis of the right M1 segment, moderate to severe stenosis of the left V4 segment, and moderate to severe stenosis of the right P2 segment. No convincing CT findings of vasculitis in the intracranial vasculature. 3. 2 mm anterior communicating artery aneurysm. 4. Asymmetric enlargement of the left piriform sinus and laryngeal ventricle can be seen with left vocal cord paralysis, similar in appearance to a CT cervical spine from 12/12/2020. Correlate with history and symptoms. 5.  Status post ACDF at C3-C4. Advanced degenerative changes at C4-C5 and C5-C6 including bulky ossification of the posterior longitudinal ligament result in at least moderate spinal canal stenosis at C4-C5. Aortic Atherosclerosis (ICD10-I70.0). Electronically Signed   By: Valetta Mole M.D.   On: 11/17/2021 13:46   DG Chest 2 View  Result Date: 11/16/2021 CLINICAL DATA:  Infection evaluation. EXAM: CHEST - 2 VIEW COMPARISON:  May 16, 2021 FINDINGS: The cardiomediastinal silhouette is stable. No pneumothorax. No nodules or masses. No focal infiltrates. Mild atelectasis in the left base. No acute abnormalities. IMPRESSION: No  active cardiopulmonary disease. Electronically Signed   By: Dorise Bullion III M.D.   On: 11/16/2021 14:12   CT Head Wo Contrast  Result Date: 11/16/2021 CLINICAL DATA:  Mental status change, headaches and double vision. EXAM: CT HEAD WITHOUT CONTRAST TECHNIQUE: Contiguous axial images were obtained from the base of the skull through the vertex without intravenous contrast. RADIATION DOSE REDUCTION: This exam was performed according to the departmental dose-optimization program which includes automated exposure control, adjustment of the mA and/or kV according to patient size and/or use of iterative reconstruction technique. COMPARISON:  05/06/2021 FINDINGS: Brain: Stable age related cerebral atrophy, ventriculomegaly and periventricular white matter disease. Remote basal ganglia and deep white matter infarcts on the right. No extra-axial fluid collections are identified. No CT findings for acute hemispheric infarction or intracranial hemorrhage. No mass lesions. The brainstem and cerebellum are normal. Vascular: Stable vascular calcifications. No aneurysm or hyperdense vessels. Skull: No skull fracture or bone lesions. Sinuses/Orbits: The paranasal sinuses and mastoid air cells are clear. The globes are intact. Other: No scalp lesions or scalp hematoma. IMPRESSION: 1. Remote/chronic brain changes. 2. No acute intracranial findings or mass lesion. Electronically Signed   By: Marijo Sanes M.D.   On: 11/16/2021 14:06   MR BRAIN WO CONTRAST  Result Date: 11/16/2021 CLINICAL DATA:  Neuro deficit, acute, stroke suspected. Blurred vision left eye, paresthesias right arm. EXAM: MRI HEAD WITHOUT CONTRAST TECHNIQUE: Multiplanar, multiecho pulse sequences of the brain and surrounding structures were obtained without intravenous contrast. COMPARISON:  Head CT 11/16/2021 and MRI 03/10/2012 FINDINGS: Brain: There is no evidence of an acute infarct, intracranial hemorrhage, mass, midline shift, or extra-axial fluid  collection. T2 hyperintensities in the cerebral white matter and pons have progressed from the prior MRI and are nonspecific but compatible with mild chronic small vessel ischemic disease. Chronic lacunar infarcts are noted involving the right corona radiata, right greater than left basal ganglia, right internal capsule, and pons. There is mild cerebral atrophy. Vascular: Major intracranial vascular flow voids are preserved. Skull and upper cervical spine: Unremarkable bone marrow signal. Sinuses/Orbits: Bilateral cataract extraction. Mild mucosal thickening in the left maxillary sinus. Small right mastoid effusion. Other: None. IMPRESSION: 1. No acute intracranial abnormality. 2. Chronic small vessel ischemic disease with lacunar infarcts as above. Electronically Signed   By: Logan Bores M.D.   On: 11/16/2021 18:40   CT VENOGRAM HEAD  Result Date: 11/17/2021 CLINICAL DATA:  Blurry vision, palpitations and temporal area, concern for venous sinus thrombosis. EXAM: CT VENOGRAM HEAD TECHNIQUE: Venographic phase images of the brain were obtained following the administration of intravenous contrast. Multiplanar reformats and maximum intensity projections were generated. RADIATION DOSE REDUCTION: This exam was performed according to the departmental dose-optimization program which includes automated exposure control, adjustment of the mA and/or kV according to patient size and/or use of iterative reconstruction technique. CONTRAST:  29mL OMNIPAQUE IOHEXOL 350 MG/ML SOLN COMPARISON:  Same-day CT/CTA head  and neck, orbit MRI obtained earlier the same day, brain MRI 1 day prior. FINDINGS: Contiguous axial images were obtained from the base of the skull through the vertex during the bolus administration of intravenous contrast using CTV timing. Multiplanar CT image reconstructions and MIPs were obtained to evaluate the venous anatomy. IMPRESSION: No evidence of venous sinus thrombosis. Electronically Signed   By: Valetta Mole M.D.   On: 11/17/2021 13:50   MR ORBITS W WO CONTRAST  Result Date: 11/17/2021 CLINICAL DATA:  76 year old male with left eye blurred vision. EXAM: MRI OF THE ORBITS WITHOUT AND WITH CONTRAST TECHNIQUE: Multiplanar, multi-echo pulse sequences of the orbits and surrounding structures were acquired including fat saturation techniques, before and after intravenous contrast administration. CONTRAST:  34mL GADAVIST GADOBUTROL 1 MMOL/ML IV SOLN COMPARISON:  Brain MRI 11/16/2021. FINDINGS: Orbits: Normal suprasellar cistern. Cavernous sinus appears within normal limits. Normal optic chiasm. Bilateral optic nerves appear symmetric without abnormal enhancement. No superimposed intraorbital mass or inflammation. Extraocular muscles and lacrimal glands appear negative. Postoperative changes to both globes. Visualized sinuses: Trace paranasal sinus mucosal thickening and mastoid fluid unchanged from the recent MRI and significance doubtful. Soft tissues: Negative visible periorbital and face soft tissues. Limited intracranial: Stable from the MRI yesterday. Chronic small-vessel ischemia more pronounced in the right hemisphere. No abnormal enhancement or dural thickening identified. Other Previous cervical ACDF. Visualized bone marrow signal is within normal limits. IMPRESSION: 1. Normal MRI appearance of the Orbits. 2. Stable appearance of the visible Brain from the MRI yesterday. Electronically Signed   By: Genevie Ann M.D.   On: 11/17/2021 10:37    Microbiology: Results for orders placed or performed during the hospital encounter of 11/16/21  Resp Panel by RT-PCR (Flu A&B, Covid) Nasopharyngeal Swab     Status: None   Collection Time: 11/16/21  1:01 PM   Specimen: Nasopharyngeal Swab; Nasopharyngeal(NP) swabs in vial transport medium  Result Value Ref Range Status   SARS Coronavirus 2 by RT PCR NEGATIVE NEGATIVE Final    Comment: (NOTE) SARS-CoV-2 target nucleic acids are NOT DETECTED.  The SARS-CoV-2 RNA is  generally detectable in upper respiratory specimens during the acute phase of infection. The lowest concentration of SARS-CoV-2 viral copies this assay can detect is 138 copies/mL. A negative result does not preclude SARS-Cov-2 infection and should not be used as the sole basis for treatment or other patient management decisions. A negative result may occur with  improper specimen collection/handling, submission of specimen other than nasopharyngeal swab, presence of viral mutation(s) within the areas targeted by this assay, and inadequate number of viral copies(<138 copies/mL). A negative result must be combined with clinical observations, patient history, and epidemiological information. The expected result is Negative.  Fact Sheet for Patients:  EntrepreneurPulse.com.au  Fact Sheet for Healthcare Providers:  IncredibleEmployment.be  This test is no t yet approved or cleared by the [place] FDA and  has been authorized for detection and/or diagnosis of SARS-CoV-2 by FDA under an Emergency Use Authorization (EUA). This EUA will remain  in effect (meaning this test can be used) for the duration of the COVID-19 declaration under Section 564(b)(1) of the Act, 21 U.S.C.section 360bbb-3(b)(1), unless the authorization is terminated  or revoked sooner.       Influenza A by PCR NEGATIVE NEGATIVE Final   Influenza B by PCR NEGATIVE NEGATIVE Final    Comment: (NOTE) The Xpert Xpress SARS-CoV-2/FLU/RSV plus assay is intended as an aid in the diagnosis of influenza from Nasopharyngeal swab specimens  and should not be used as a sole basis for treatment. Nasal washings and aspirates are unacceptable for Xpert Xpress SARS-CoV-2/FLU/RSV testing.  Fact Sheet for Patients: EntrepreneurPulse.com.au  Fact Sheet for Healthcare Providers: IncredibleEmployment.be  This test is not yet approved or cleared by the [place] FDA and has been authorized for detection and/or diagnosis of SARS-CoV-2 by FDA under an Emergency Use Authorization (EUA). This EUA will remain in effect (meaning this test can be used) for the duration of the COVID-19 declaration under Section 564(b)(1) of the Act, 21 U.S.C. section 360bbb-3(b)(1), unless the authorization is terminated or revoked.  Performed at [facility], [address], [place], [place] [address]   Urine Culture     Status: None   Collection Time: 11/16/21  9:00 PM   Specimen: Urine, Clean Catch  Result Value Ref Range Status   Specimen Description   Final    URINE, CLEAN CATCH Performed at [facility], [address] [place], [place] [address]    Special Requests   Final    NONE Performed at [facility], [address], [place], [place] [address]    Culture   Final    NO GROWTH Performed at [facility], [place] [address], [place], [place] [address]    Report Status 11/18/2021 FINAL  Final   *Note: Due to a large number of results and/or encounters for the requested time period, some results have not been displayed. A complete set of results can be found in Results Review.    Labs: CBC: Recent Labs  Lab 11/16/21 1311 11/17/21 0515 11/18/21 0515  WBC 5.2 5.2 8.6  NEUTROABS 4.0 3.6  --   HGB 11.1* 10.4* 11.1*  HCT 33.0* 31.8* 33.8*  MCV 102.8* 102.3* 99.7  PLT 166 150 116   Basic Metabolic Panel: Recent Labs  Lab 11/16/21 1311 11/17/21 0515 11/18/21 0515  NA 136 137 132*  K 3.7 3.3* 3.8  CL 105 105 99  CO2 $Re'24 24 23  'efF$ GLUCOSE 137* 88 291*  BUN $Re'19 15 20  'czz$ CREATININE 0.87 0.72 0.89  CALCIUM 8.5* 8.5* 8.9  MG  --  2.2 2.2   Liver Function Tests: Recent Labs  Lab 11/17/21 0515  AST 15  ALT 12  ALKPHOS 49  BILITOT 0.4  PROT 6.4*  ALBUMIN 3.6   CBG: Recent Labs  Lab 11/18/21 0721 11/18/21 0723 11/18/21 0725 11/18/21 1111 11/18/21 1217  GLUCAP 272* 240* 262* 310* 285*    Discharge time spent: greater than 30  minutes.  Signed: Barton Dubois, MD [facility] 11/18/2021

## 2021-11-18 NOTE — Progress Notes (Signed)
Inpatient Diabetes Program Recommendations ? ?AACE/ADA: New Consensus Statement on Inpatient Glycemic Control (2015) ? ?Target Ranges:  Prepandial:   less than 140 mg/dL ?     Peak postprandial:   less than 180 mg/dL (1-2 hours) ?     Critically ill patients:  140 - 180 mg/dL  ? ? Latest Reference Range & Units 11/17/21 07:24 11/17/21 11:08 11/17/21 16:15 11/17/21 22:01  ?Glucose-Capillary 70 - 99 mg/dL 91 160 (H) 200 (H) 299 (H)  ? ? Latest Reference Range & Units 11/18/21 07:25  ?Glucose-Capillary 70 - 99 mg/dL 262 (H)  ?(H): Data is abnormally high ? ? ? ?Admit with: Blurred Vision/ Question Temporal Arteritis ? ?History: DM2 ? ?Home DM Meds: Amaryl 4 mg daily ? ?Current Orders: Novolog Sensitive Correction Scale/ SSI (0-9 units) TID AC ? ? ? ? ?MD- Note patient getting total of 3 doses of Solumedrol 1000 mg daily--2nd dose due today ? ?CBG 262 this AM (AM CBG yesterday was OK prior to steroids being started) ? ?May consider increasing the dose and frequency of the Novolog SSI to Moderate scale (0-15 units) Q4 hours while pt gets the Solumedrol ? ? ? ?--Will follow patient during hospitalization-- ? ?Wyn Quaker RN, MSN, CDE ?Diabetes Coordinator ?Inpatient Glycemic Control Team ?Team Pager: [phone] (8a-5p) ? ?

## 2021-11-19 ENCOUNTER — Telehealth: Payer: Self-pay | Admitting: *Deleted

## 2021-11-19 LAB — LYME DISEASE DNA BY PCR(BORRELIA BURG)

## 2021-11-19 NOTE — Telephone Encounter (Signed)
Transition Care Management Follow-up Telephone Call ?Date of discharge and from where: 11-18-21 [facility] double vision  ?How have you been since you were released from the hospital? Wearing a patch over eye ?Any questions or concerns? No ? ?Items Reviewed: ?Did the pt receive and understand the discharge instructions provided? Yes  ?Medications obtained and verified? Yes  ?Other? No  ?Any new allergies since your discharge? Yes  ?Dietary orders reviewed? Yes ?Do you have support at home? Yes  ? ?Home Care and Equipment/Supplies: ?Were home health services ordered? no ?If so, what is the name of the agency? NA  ?Has the agency set up a time to come to the patient's home? not applicable ?Were any new equipment or medical supplies ordered?  No ?What is the name of the medical supply agency? NA ?Were you able to get the supplies/equipment? not applicable ?Do you have any questions related to the use of the equipment or supplies? No ? ?Functional Questionnaire: (I = Independent and D = Dependent) ?ADLs: i ? ?Bathing/Dressing- i ? ?Meal Prep- i ? ?Eating- i ? ?Maintaining continence- i ? ?Transferring/Ambulation- i ? ?Managing Meds- d ? ?Follow up appointments reviewed: ? ?PCP Hospital f/u appt confirmed? Yes  Scheduled to see [place] on 11-26-21 @ 2:20. ?[facility] f/u appt confirmed? No   ?Are transportation arrangements needed? No  ?If their condition worsens, is the pt aware to call PCP or go to the Emergency Dept.? Yes ?Was the patient provided with contact information for the PCP's office or ED? Yes ?Was to pt encouraged to call back with questions or concerns? Yes ? ?

## 2021-11-20 ENCOUNTER — Telehealth: Payer: Self-pay

## 2021-11-20 LAB — PROTEIN ELECTROPHORESIS, SERUM
A/G Ratio: 1.3 (ref 0.7–1.7)
Albumin ELP: 3.8 g/dL (ref 2.9–4.4)
Alpha-1-Globulin: 0.2 g/dL (ref 0.0–0.4)
Alpha-2-Globulin: 0.6 g/dL (ref 0.4–1.0)
Beta Globulin: 0.8 g/dL (ref 0.7–1.3)
Gamma Globulin: 1.3 g/dL (ref 0.4–1.8)
Globulin, Total: 2.9 g/dL (ref 2.2–3.9)
M-Spike, %: 0.2 g/dL — ABNORMAL HIGH
Total Protein ELP: 6.7 g/dL (ref 6.0–8.5)

## 2021-11-20 NOTE — Telephone Encounter (Signed)
[name] has requested to speak to you again. He stated the Botox injection is working. But he is feeling weak all over his body. Could the Botox be the cause? He also wants to discuss other medical concerns with you. ? ?Recent Hospital visit copied from [place]:  ? ?11/17/21: Patient has been admitted with sudden onset of blurred vision and ocular pain which was concerning for temporal arteritis.  Neurology consulted with ongoing work-up pending.  Patient will likely be started on IV Solu-Medrol 1000mg  daily for 3 days if further work-up remains negative. ?  ?11/18/21: Work-up completely negative for temporal arteritis suspicious.  After discussing with neurology, there is concerns for a small brainstem infarct responsible for patient's symptoms.  Patient reporting some improvement in his diplopia.  Still having mild headaches.  Will discharge home on full dose aspirin and Plavix for 3 months; plan is that patient will continue full dose aspirin.  Outpatient follow-up with neurology service. ? ?Call back phone [phone]. ?

## 2021-11-23 LAB — LYME DISEASE SEROLOGY W/REFLEX: Lyme Total Antibody EIA: NEGATIVE

## 2021-11-26 ENCOUNTER — Ambulatory Visit (INDEPENDENT_AMBULATORY_CARE_PROVIDER_SITE_OTHER): Payer: HMO | Admitting: Nurse Practitioner

## 2021-11-26 ENCOUNTER — Telehealth: Payer: Self-pay

## 2021-11-26 ENCOUNTER — Encounter: Payer: Self-pay | Admitting: Nurse Practitioner

## 2021-11-26 ENCOUNTER — Other Ambulatory Visit: Payer: Self-pay

## 2021-11-26 VITALS — BP 133/71 | HR 46 | Ht 72.0 in | Wt 204.0 lb

## 2021-11-26 DIAGNOSIS — E782 Mixed hyperlipidemia: Secondary | ICD-10-CM | POA: Diagnosis not present

## 2021-11-26 DIAGNOSIS — H538 Other visual disturbances: Secondary | ICD-10-CM

## 2021-11-26 DIAGNOSIS — I10 Essential (primary) hypertension: Secondary | ICD-10-CM

## 2021-11-26 DIAGNOSIS — Z09 Encounter for follow-up examination after completed treatment for conditions other than malignant neoplasm: Secondary | ICD-10-CM | POA: Diagnosis not present

## 2021-11-26 DIAGNOSIS — E1159 Type 2 diabetes mellitus with other circulatory complications: Secondary | ICD-10-CM

## 2021-11-26 DIAGNOSIS — E871 Hypo-osmolality and hyponatremia: Secondary | ICD-10-CM

## 2021-11-26 DIAGNOSIS — E039 Hypothyroidism, unspecified: Secondary | ICD-10-CM

## 2021-11-26 DIAGNOSIS — D539 Nutritional anemia, unspecified: Secondary | ICD-10-CM

## 2021-11-26 DIAGNOSIS — E559 Vitamin D deficiency, unspecified: Secondary | ICD-10-CM

## 2021-11-26 NOTE — Progress Notes (Signed)
? ?[name]     MRN: [id number]      DOB: 09-01-1946 ? ? ?HPI ?[name] with medical history of hypertension, cardiomyopathy, PAD, bilateral carotid artery disease, CHF, type 2 diabetes, hypothyroidism, hyperlipidemia is here for follow up for hospital admission from 11/16/2021 to 11/18/2021 for blurry vision.  Patient is being treated with full dose aspirin and Plavix for 3 months.  He has been using eye patch to assist with diplopia symptoms and he has followed up with neurology. He saw Dr Charm Barges 2 days ago, they told him that he probably had a mini stroke that was not seen on the imaging. He has started taking VItmin B12 again, neurology told him to start taking med  ?Patient states that he goes to [facility] ophthalmology and was last seen some months ago, patient has retinopathy and with prescription glasses. ? ?Patient states that he still has double vision unchanged since he left the hospital.  Denies eye pain, eye discharge. ? ?ROS ?Denies recent fever or chills. ?Denies sinus pressure, nasal congestion, ear pain or sore throat. ?Denies chest congestion, productive cough or wheezing. ?Denies chest pains, palpitations and leg swelling ?Denies abdominal pain, nausea, vomiting,diarrhea or constipation.   ?Denies dysuria, frequency, hesitancy or incontinence. ?Denies joint pain, swelling and limitation in mobility. ?Denies headaches, seizures, numbness, or tingling. ?Denies depression, anxiety or insomnia. ?Denies skin break down or rash. ? ? ?PE ? ?BP 133/71 (BP Location: Right Arm, Patient Position: Sitting, Cuff Size: Large)   Pulse (!) 46   Ht 6' (1.829 m)   Wt 204 lb (92.5 kg)   SpO2 98%   BMI 27.67 kg/m?  ? ?Patient alert and oriented and in no cardiopulmonary distress. ? ?HEENT: No facial asymmetry, EOMI,     Neck supple . ? ?Chest: Clear to auscultation bilaterally. ? ?CVS: S1, S2 no murmurs, no S3.Regular rate. ? ?ABD: Soft non tender.  ? ?MS: decreased ROM spine, shoulders, hips and  knees using a walker ? ?Skin: Intact, no ulcerations or rash noted. ? ?Psych: Good eye contact, normal affect. Memory intact not anxious or depressed appearing. ? ? ?Assessment & Plan ?Hospital discharge follow-up ?here for follow up for hospital admission from 11/16/2021 to 11/18/2021 for blurry vision. Hospital chart reviewed including DC summary ?Patient reports no change in his blurry vision ?He has seen neurologist 2 days ago, has a follow-up in 2 months with neurology, he has been taking Plavix 75 mg daily and aspirin 325 mg daily.  ? ?Hyponatremia ?Lab Results  ?Component Value Date  ? NA 132 (L) 11/18/2021  ? K 3.8 11/18/2021  ? CO2 23 11/18/2021  ? GLUCOSE 291 (H) 11/18/2021  ? BUN 20 11/18/2021  ? CREATININE 0.89 11/18/2021  ? CALCIUM 8.9 11/18/2021  ? EGFR 81 10/06/2021  ? GFRNONAA >60 11/18/2021  ?Check BMP today ? ?Blurred vision ?Recently on admission at the hospital due to complaints of blurry vision. ?Patient is following up with neurology due to concern for small infarct not visualized on patient images ?Taking Plavix $RemoveBefore'75mg'[id number]$  daily for 3 months, aspirin 325 mg tablets daily.  ?Continue use of eye patch daily. ?Patient encouraged to maintain close follow-up with neurology ?Patient will schedule an appointment with ophthalmologist for follow-up ?Take Tylenol 650 mg every 6 hours as needed for ongoing headaches ? ? ?Hypertension ?BP Readings from Last 3 Encounters:  ?11/26/21 133/71  ?11/18/21 (!) 146/83  ?11/13/21 124/71  ?Condition well-controlled, continue carvedilol 1.5625 mg twice daily, furosemide 40 mg twice daily, ?  Patient told to exercise as tolerated.  ?  ? ?

## 2021-11-26 NOTE — Telephone Encounter (Signed)
Casts Sent to [employer] - HOLD FOR CMN ?

## 2021-11-26 NOTE — Patient Instructions (Signed)
Please get your TDAP, shingles vaccine, COVID booster vaccines at your pharmacy ? ?It is important that you exercise regularly at least 30 minutes 5 times a week.  ?Think about what you will eat, plan ahead. ?Choose " clean, green, fresh or frozen" over canned, processed or packaged foods which are more sugary, salty and fatty. ?70 to 75% of food eaten should be vegetables and fruit. ?Three meals at set times with snacks allowed between meals, but they must be fruit or vegetables. ?Aim to eat over a 12 hour period , example 7 am to 7 pm, and STOP after  your last meal of the day. ?Drink water,generally about 64 ounces per day, no other drink is as healthy. Fruit juice is best enjoyed in a healthy way, by EATING the fruit. ? ?Thanks for choosing [facility], we consider it a privelige to serve you. ? ?

## 2021-11-26 NOTE — Assessment & Plan Note (Addendum)
Recently on admission at the hospital due to complaints of blurry vision. ?Patient is following up with neurology due to concern for small infarct not visualized on patient images ?Taking Plavix '75mg'$  daily for 3 months, aspirin 325 mg tablets daily.  ?Continue use of eye patch daily. ?Patient encouraged to maintain close follow-up with neurology ?Patient will schedule an appointment with ophthalmologist for follow-up ?Take Tylenol 650 mg every 6 hours as needed for ongoing headaches ? ?

## 2021-11-26 NOTE — Assessment & Plan Note (Signed)
BP Readings from Last 3 Encounters:  ?11/26/21 133/71  ?11/18/21 (!) 146/83  ?11/13/21 124/71  ?Condition well-controlled, continue carvedilol 1.5625 mg twice daily, furosemide 40 mg twice daily, ?Patient told to exercise as tolerated.  ? ?

## 2021-11-26 NOTE — Assessment & Plan Note (Signed)
Lab Results  ?Component Value Date  ? NA 132 (L) 11/18/2021  ? K 3.8 11/18/2021  ? CO2 23 11/18/2021  ? GLUCOSE 291 (H) 11/18/2021  ? BUN 20 11/18/2021  ? CREATININE 0.89 11/18/2021  ? CALCIUM 8.9 11/18/2021  ? EGFR 81 10/06/2021  ? GFRNONAA >60 11/18/2021  ?Check BMP today ?

## 2021-11-26 NOTE — Assessment & Plan Note (Signed)
here for follow up for hospital admission from 11/16/2021 to 11/18/2021 for blurry vision. Hospital chart reviewed including DC summary ?Patient reports no change in his blurry vision ?He has seen neurologist 2 days ago, has a follow-up in 2 months with neurology, he has been taking Plavix 75 mg daily and aspirin 325 mg daily.  ?

## 2021-11-27 LAB — BASIC METABOLIC PANEL
BUN/Creatinine Ratio: 17 (ref 10–24)
BUN: 14 mg/dL (ref 8–27)
CO2: 25 mmol/L (ref 20–29)
Calcium: 8.9 mg/dL (ref 8.6–10.2)
Chloride: 100 mmol/L (ref 96–106)
Creatinine, Ser: 0.82 mg/dL (ref 0.76–1.27)
Glucose: 86 mg/dL (ref 70–99)
Potassium: 3.6 mmol/L (ref 3.5–5.2)
Sodium: 140 mmol/L (ref 134–144)
eGFR: 90 mL/min/{1.73_m2} (ref >59)

## 2021-11-27 NOTE — Progress Notes (Signed)
Normal electrolytes kidney function is normal. ?Thank you

## 2021-12-18 ENCOUNTER — Other Ambulatory Visit: Payer: Self-pay | Admitting: Nurse Practitioner

## 2021-12-22 ENCOUNTER — Ambulatory Visit: Payer: HMO | Admitting: Podiatry

## 2021-12-22 ENCOUNTER — Ambulatory Visit: Payer: HMO

## 2021-12-22 ENCOUNTER — Encounter: Payer: Self-pay | Admitting: Podiatry

## 2021-12-22 DIAGNOSIS — B351 Tinea unguium: Secondary | ICD-10-CM

## 2021-12-22 DIAGNOSIS — E1159 Type 2 diabetes mellitus with other circulatory complications: Secondary | ICD-10-CM | POA: Diagnosis not present

## 2021-12-22 DIAGNOSIS — L84 Corns and callosities: Secondary | ICD-10-CM | POA: Diagnosis not present

## 2021-12-22 DIAGNOSIS — E1142 Type 2 diabetes mellitus with diabetic polyneuropathy: Secondary | ICD-10-CM

## 2021-12-22 DIAGNOSIS — Z89412 Acquired absence of left great toe: Secondary | ICD-10-CM

## 2021-12-22 DIAGNOSIS — E1169 Type 2 diabetes mellitus with other specified complication: Secondary | ICD-10-CM | POA: Diagnosis not present

## 2021-12-22 DIAGNOSIS — E08621 Diabetes mellitus due to underlying condition with foot ulcer: Secondary | ICD-10-CM

## 2021-12-22 NOTE — Progress Notes (Signed)
SITUATION ?Reason for Consult: Follow-up with diabetic shoe order ?Patient / Caregiver Report: Patient was concerned he had not heard anything back since January and came to check up on it personally ? ?OBJECTIVE DATA ?History / Diagnosis:  ?  ICD-10-CM   ?1. Diabetic peripheral neuropathy associated with type 2 diabetes mellitus ([place])  E11.42   ?  ?2. Diabetic ulcer of right midfoot associated with diabetes mellitus due to underlying condition, with fat layer exposed ([place])  M35.361   ? W43.154   ?  ?3. Status post amputation of left great toe [facility])  Z89.412   ?  ? ? ?Change in Pathology: None ? ?ACTIONS PERFORMED ?Determined that patient's CMN was not received due to being signed by an FNP twice. Patient confirms that he has been seen by Dr. Ihor Dow and both this practice and the patient will remind patient's PCP that an MD must sign the form.  ? ?Patient also took the opportunity to change his shoe order - Patient would like [facility] [address] in [address] ? ?All questions answered and concerns addressed. ? ?PLAN ?Follow-up as needed (PRN). Plan of care discussed with and agreed upon by patient / caregiver. ? ?

## 2021-12-27 NOTE — Progress Notes (Signed)
?  Subjective:  ?Patient ID: [name], male    DOB: October 04, 1945,  MRN: [id number] ? ?[name] presents to clinic today for at risk foot care. Patient has h/o amputation of digital amputation left great toe, painful elongated mycotic toenails 1-5 bilaterally which are tender when wearing enclosed shoe gear. Pain is relieved with periodic professional debridement., and preulcerative lesion(s) right foot. ? ?Last HgA1c was 6.8%. Patient did not check blood glucose today. ? ?New problem(s): None.  ? ?PCP is Renee Rival, FNP , and last visit was November 26, 2021. ? ?Allergies  ?Allergen Reactions  ? Codeine Nausea And Vomiting  ? Tramadol Nausea Only  ? ? ?Review of Systems: Negative except as noted in the HPI. ? ?Objective: No changes noted in today's physical examination. ?Constitutional Patient is a pleasant 76 y.o. Caucasian male in NAD. AAO x 3.  ?Vascular Capillary fill time to digits <3 seconds b/l lower extremities. Faintly palpable DP pulse(s) b/l lower extremities. Nonpalpable PT pulse(s) b/l lower extremities. Pedal hair present. Lower extremity skin temperature gradient within normal limits. No cyanosis or clubbing noted.  ?Neurologic Normal speech. Protective sensation intact 5/5 intact bilaterally with 10g monofilament b/l.  ?Dermatologic Pedal skin with normal turgor, texture and tone bilaterally. No open wounds bilaterally. No interdigital macerations bilaterally. Toenails 2-5 bilaterally and R hallux elongated, discolored, dystrophic, thickened, and crumbly with subungual debris and tenderness to dorsal palpation.Preulcerative lesion submet head 1 right foot with subdermal hemorrage noted. No erythema, no edema, no drainage, no fluctuance noted.    ?Orthopedic: Normal muscle strength 5/5 to all lower extremity muscle groups bilaterally. No pain crepitus or joint limitation noted with ROM b/l. Lower extremity amputation(s): digital amputation L hallux.  ? ? ?  Latest Ref Rng & Units  10/06/2021  ? 10:37 AM 01/26/2021  ?  8:25 AM  ?Hemoglobin A1C  ?Hemoglobin-A1c 4.8 - 5.6 % 6.6   9.6    ? ?Assessment/Plan: ?1. Onychomycosis of multiple toenails with type 2 diabetes mellitus and peripheral neuropathy ([place])   ?2. Pre-ulcerative calluses   ?3. Type 2 diabetes mellitus with other circulatory complication, without long-term current use of insulin ([place])   ?  ?-Patient was evaluated and treated. All patient's and/or POA's questions/concerns answered on today's visit. ?-Patient with h/o plantar ulcer right foot.which remains closed. ?-Mycotic toenails 2-5 bilaterally and left great toe were debrided in length and girth with sterile nail nippers and dremel without iatrogenic bleeding. ?-Preulcerative lesion pared submet head 1 right foot. Total number pared=1. ?-Patient/POA to call should there be question/concern in the interim.  ? ?Return in about 9 weeks (around 02/23/2022). ? ?Marzetta Board, DPM  ?

## 2021-12-29 ENCOUNTER — Encounter: Payer: HMO | Attending: Physical Medicine & Rehabilitation | Admitting: Physical Medicine & Rehabilitation

## 2021-12-29 ENCOUNTER — Encounter: Payer: Self-pay | Admitting: Physical Medicine & Rehabilitation

## 2021-12-29 VITALS — BP 149/73 | HR 74 | Ht 72.0 in | Wt 208.0 lb

## 2021-12-29 DIAGNOSIS — M4802 Spinal stenosis, cervical region: Secondary | ICD-10-CM | POA: Diagnosis present

## 2021-12-29 DIAGNOSIS — G8110 Spastic hemiplegia affecting unspecified side: Secondary | ICD-10-CM | POA: Diagnosis present

## 2021-12-29 NOTE — Patient Instructions (Signed)
Will increase botox next injection in ~6wks ?

## 2021-12-29 NOTE — Progress Notes (Signed)
? ?Subjective:  ? ? Patient ID: [name], male    DOB: [birth year], 76 y.o.   MRN: [id number] ? ?HPI ? ?76 year old male with prior history of cervical stenosis as well as right MCA distribution infarct with chronic left spastic hemiplegia.  He has had increasing falls over the last year.  He complains that his left leg draws towards his right leg.  He did get some good relief with the botulinum toxin injection to the abductor muscle groups of left lower extremity performed 6 weeks ago.  He feels like they are starting to wear off.  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 ?He has been treated for small brainstem strokes in the interval time.  He has been seen by neurology, was seen in the emergency department but did not require admission.  He states that since that time he has had double vision and has followed up with ophthalmology who stated it was most likely due to his brainstem stroke. ?Botox  ?Adductor longus 50 ?Adductor magnus 50 ?Pain Inventory ?Average Pain 10 ?Pain Right Now 0 ?My pain is intermittent and aching ? ?In the last 24 hours, has pain interfered with the following? ?General activity 0 ?Relation with others 0 ?Enjoyment of life 0 ?What TIME of day is your pain at its worst? morning  ?Sleep (in general) Fair ? ?Pain is worse with:  getting up first thing in the morning ?Pain improves with: pacing activities ?Relief from Meds:  na ? ?Family History  ?Problem Relation Age of Onset  ? Heart disease Mother   ? Hypertension Mother   ? Heart attack Mother   ? Hypertension Father   ? Diabetes Father   ? Diabetes Son   ? Heart disease Son   ? Hypertension Son   ? Colon cancer Neg Hx   ? ?Social History  ? ?Socioeconomic History  ? Marital  status: Married  ?  Spouse name: [name]   ? Number of children: 4  ? Years of education: college  ? Highest education level: Not on file  ?Occupational History  ? Occupation: Firefighter   ?Tobacco Use  ? Smoking status: Former  ?  Types: Pipe  ?  Quit date: 07/08/1977  ?  Years since quitting: 44.5  ? Smokeless tobacco: Never  ?Vaping Use  ? Vaping Use: Never used  ?Substance and Sexual Activity  ? Alcohol use: No  ?  Alcohol/week: 0.0 standard drinks  ? Drug use: No  ? Sexual activity: Not on file  ?Other Topics Concern  ? Not on file  ?Social History Narrative  ? Retired from medical -[employer]  ?   ? Lives with [name]   ? Cat: Jazz  ?   ? Enjoy: sleeping a lot   ?   ? Diet: eats all food groups -chicken, steak-chopped, mostly veggie  ? Caffeine: coffee and soda "a lot"  ? Water: 3-4 cups daily  ?   ? Wears seat belt  ? Does not use phone while driving   ? Smoke detectors at home   ? weapons at home   ?    ? ?Social Determinants of Health  ? ?Financial Resource Strain: Not on file  ?Food Insecurity: No Food  Insecurity  ? Worried About Charity fundraiser in the Last Year: Never true  ? Ran Out of Food in the Last Year: Never true  ?Transportation Needs: No Transportation Needs  ? Lack of Transportation (Medical): No  ? Lack of Transportation (Non-Medical): No  ?Physical Activity: Not on file  ?Stress: Not on file  ?Social Connections: Moderately Isolated  ? Frequency of Communication with Friends and Family: Three times a week  ? Frequency of Social Gatherings with Friends and Family: Three times a week  ? Attends Religious Services: Never  ? Active Member of Clubs or Organizations: No  ? Attends Archivist Meetings: Never  ? Marital Status: Married  ? ?Past Surgical History:  ?Procedure Laterality Date  ? AMPUTATION TOE Left 07/25/2018  ? Procedure: AMPUTATION TOE INTERPHALANGEAL HALLUX LEFT;  Surgeon: Evelina Bucy, DPM;  Location: [place];  Service: Podiatry;  Laterality: Left;  ?  ANGIOPLASTY  02/28/14  ? diamond back orbital rotational atherectomy of Rt. tibial  ? BACK SURGERY    ? BONE BIOPSY Left 07/25/2018  ? Procedure: SUPERFICIAL BONE BIOPSY;  Surgeon: Evelina Bucy, DPM;  Location: [place];  Service: Podiatry;  Laterality: Left;  ? CARDIAC CATHETERIZATION N/A 05/19/2016  ? Procedure: Right/Left Heart Cath and Coronary Angiography;  Surgeon: Belva Crome, MD;  Location: [place] [facility];  Service: Cardiovascular;  Laterality: N/A;  ? CERVICAL FUSION    ? ENDARTERECTOMY Left 11/29/2013  ? Procedure: ENDARTERECTOMY CAROTID;  Surgeon: Serafina Mitchell, MD;  Location: [place];  Service: Vascular;  Laterality: Left;  ? ESOPHAGOGASTRODUODENOSCOPY  02/2010  ? Dr. Gala Romney: patient presented with food impaction, schatzki ring with superimposed component of stricture with erosive reflux esophagitis, s/p disimpaction but dilation planned at later date   ? ESOPHAGOGASTRODUODENOSCOPY (EGD) WITH PROPOFOL N/A 08/13/2019  ? Dr. Gala Romney: Erosive reflux esophagitis with mild stricture and incidental Mallory-Weiss tear which precluded esophageal dilation.  Medium sized hiatal hernia.  ? ESOPHAGOGASTRODUODENOSCOPY (EGD) WITH PROPOFOL N/A 10/11/2019  ? Dr. Gala Romney: Esophageal stenosis status post dilation, moderate hiatal hernia  ? EYE SURGERY    ? FLEXIBLE SIGMOIDOSCOPY N/A 08/13/2019  ? Procedure: FLEXIBLE SIGMOIDOSCOPY;  Surgeon: [name], MD;  [facility];  Service: Endoscopy;  Laterality: N/A;  colonoscopy aboerted due to formed stool and poor prep  ? FOOT SURGERY    ? KNEE ARTHROSCOPY WITH MEDIAL MENISECTOMY Left 05/06/2020  ? Procedure: KNEE ARTHROSCOPY WITH MEDIAL MENISCECTOMY AND LATERAL MENISCECTOMY;  Surgeon: [name], MD;  Location: [facility];  Service: Orthopedics;  Laterality: Left;  ? Lower ext duplex doppler  03/14/14  ? Rt ABI 1.2  ? LOWER EXTREMITY ANGIOGRAM Bilateral 02/18/2014  ? Procedure: LOWER EXTREMITY ANGIOGRAM;  Surgeon: [name], MD;  Location: [facility];   Service: Cardiovascular;  Laterality: Bilateral;  ? LOWER EXTREMITY ANGIOGRAM N/A 10/31/2014  ? Procedure: LOWER EXTREMITY ANGIOGRAM;  Surgeon: [name], MD;  Location: [facility];  Service: Cardiovascular;  Laterality: N/A;  ? MALONEY DILATION N/A 10/11/2019  ? Procedure: MALONEY DILATION;  Surgeon: [name], MD;  [facility];  Service: Endoscopy;  Laterality: N/A;  ? MULTIPLE TOOTH EXTRACTIONS    ? PV angiogram  02/18/2014  ? tibial vessel diseas bil.  ? SPINE SURGERY    ? tendon achillies lengthing and sesamoid    ? ?Past Surgical History:  ?Procedure Laterality Date  ? AMPUTATION TOE Left 07/25/2018  ? Procedure: AMPUTATION TOE INTERPHALANGEAL  HALLUX LEFT;  Surgeon: Evelina Bucy, DPM;  Location: [place];  Service: Podiatry;  Laterality: Left;  ? ANGIOPLASTY  02/28/14  ? diamond back orbital rotational atherectomy of Rt. tibial  ? BACK SURGERY    ? BONE BIOPSY Left 07/25/2018  ? Procedure: SUPERFICIAL BONE BIOPSY;  Surgeon: Evelina Bucy, DPM;  Location: [place];  Service: Podiatry;  Laterality: Left;  ? CARDIAC CATHETERIZATION N/A 05/19/2016  ? Procedure: Right/Left Heart Cath and Coronary Angiography;  Surgeon: Belva Crome, MD;  Location: [place] [facility];  Service: Cardiovascular;  Laterality: N/A;  ? CERVICAL FUSION    ? ENDARTERECTOMY Left 11/29/2013  ? Procedure: ENDARTERECTOMY CAROTID;  Surgeon: Serafina Mitchell, MD;  Location: [place];  Service: Vascular;  Laterality: Left;  ? ESOPHAGOGASTRODUODENOSCOPY  02/2010  ? Dr. Gala Romney: patient presented with food impaction, schatzki ring with superimposed component of stricture with erosive reflux esophagitis, s/p disimpaction but dilation planned at later date   ? ESOPHAGOGASTRODUODENOSCOPY (EGD) WITH PROPOFOL N/A 08/13/2019  ? Dr. Gala Romney: Erosive reflux esophagitis with mild stricture and incidental Mallory-Weiss tear which precluded esophageal dilation.  Medium sized hiatal hernia.  ? ESOPHAGOGASTRODUODENOSCOPY (EGD) WITH PROPOFOL N/A  10/11/2019  ? Dr. Gala Romney: Esophageal stenosis status post dilation, moderate hiatal hernia  ? EYE SURGERY    ? FLEXIBLE SIGMOIDOSCOPY N/A 08/13/2019  ? Procedure: FLEXIBLE SIGMOIDOSCOPY;  Surgeon: [name], Jerilynn Mages

## 2022-01-04 ENCOUNTER — Ambulatory Visit: Payer: HMO | Admitting: Nurse Practitioner

## 2022-01-18 ENCOUNTER — Other Ambulatory Visit: Payer: Self-pay | Admitting: Cardiovascular Disease

## 2022-02-04 ENCOUNTER — Ambulatory Visit: Payer: HMO

## 2022-02-08 ENCOUNTER — Telehealth: Payer: Self-pay

## 2022-02-08 NOTE — Telephone Encounter (Signed)
CMN Received - Casts released from fabrication hold - shoes ordered  [facility] [place]

## 2022-02-12 ENCOUNTER — Encounter: Payer: HMO | Admitting: Physical Medicine & Rehabilitation

## 2022-02-22 ENCOUNTER — Telehealth: Payer: Self-pay | Admitting: *Deleted

## 2022-02-22 ENCOUNTER — Other Ambulatory Visit: Payer: Self-pay | Admitting: Neurological Surgery

## 2022-02-22 NOTE — Telephone Encounter (Signed)
   Pre-operative Risk Assessment    Patient Name: [name]  DOB: [birth year] MRN: [id number]      Request for Surgical Clearance    Procedure:  CERVICAL CORPECTOMY C4 w/RECONSTRUCTION USING TITANIUM SPACE/ANTERIOR FIXATION FROM C4-6.  Date of Surgery:  Clearance 03/11/22                                 Surgeon:  DR. Kristeen Miss Surgeon's Group or Practice Name:  [place] Phone number:  [phone] Fax number:  [phone] ATTN: JESSICA   Type of Clearance Requested:   - Medical  - Pharmacy:  Hold Clopidogrel (Plavix)     Type of Anesthesia:  General    Additional requests/questions:    [name]   02/22/2022, 10:40 AM

## 2022-02-22 NOTE — Telephone Encounter (Signed)
   Name: [name]  DOB: 1946-06-30  MRN: [id number]  Primary Cardiologist: Quay Burow, MD  Chart reviewed as part of pre-operative protocol coverage. Because of [place] past medical history and time since last visit, he will require a follow-up in-office visit in order to better assess preoperative cardiovascular risk.  Pre-op covering staff: - Please schedule appointment and call patient to inform them. If patient already had an upcoming appointment within acceptable timeframe, please add "pre-op clearance" to the appointment notes so provider is aware. - Please contact requesting surgeon's office via preferred method (i.e, phone, fax) to inform them of need for appointment prior to surgery.  This message will also be routed to Dr Gwenlyn Found for input on holding Plavix as requested below so that this information is available to the clearing provider at time of patient's appointment.   Since Plavix will need to be held for 7 days, please arrange preop visit on 6/14 or earlier to allow adequate time to hold Plavix.  [place], [place]  02/22/2022, 8:48 PM

## 2022-02-23 ENCOUNTER — Ambulatory Visit: Payer: HMO | Admitting: Podiatry

## 2022-02-23 ENCOUNTER — Ambulatory Visit (INDEPENDENT_AMBULATORY_CARE_PROVIDER_SITE_OTHER): Payer: HMO

## 2022-02-23 ENCOUNTER — Encounter: Payer: Self-pay | Admitting: Podiatry

## 2022-02-23 DIAGNOSIS — L97511 Non-pressure chronic ulcer of other part of right foot limited to breakdown of skin: Secondary | ICD-10-CM

## 2022-02-23 DIAGNOSIS — E08621 Diabetes mellitus due to underlying condition with foot ulcer: Secondary | ICD-10-CM

## 2022-02-23 DIAGNOSIS — G959 Disease of spinal cord, unspecified: Secondary | ICD-10-CM | POA: Insufficient documentation

## 2022-02-23 DIAGNOSIS — E1142 Type 2 diabetes mellitus with diabetic polyneuropathy: Secondary | ICD-10-CM | POA: Diagnosis not present

## 2022-02-23 DIAGNOSIS — Z978 Presence of other specified devices: Secondary | ICD-10-CM

## 2022-02-23 DIAGNOSIS — B351 Tinea unguium: Secondary | ICD-10-CM | POA: Diagnosis not present

## 2022-02-23 DIAGNOSIS — Z89412 Acquired absence of left great toe: Secondary | ICD-10-CM | POA: Diagnosis not present

## 2022-02-23 MED ORDER — DOXYCYCLINE HYCLATE 100 MG PO CAPS: 100.0000 mg | ORAL_CAPSULE | Freq: Two times a day (BID) | ORAL | 0 refills | Status: AC

## 2022-02-23 NOTE — Progress Notes (Signed)
Subjective: at risk foot care. Patient has h/o amputation of partial amputation of L hallux and preulcerative lesion(s) submet head 1 right foot and painful mycotic toenails that limit ambulation. Painful toenails interfere with ambulation. Aggravating factors include wearing enclosed shoe gear. Pain is relieved with periodic professional debridement. Painful porokeratotic lesions are aggravated when weightbearing with and without shoegear. Pain is relieved with periodic professional debridement.  Patient states he has been having right foot pain for the past week. He has h/o ulceration in this area and has been treated at [facility] for [place]. He denies any drainage, redness or swelling. Denies any fever, chills, night sweats, nausea or vomiting.  He did not check his blood glucose on today. Last known A1c was 6.1% per patient.  PCP is Vena Rua, FNP, and last visit was two months ago.     Past Medical History:  Diagnosis Date   Aftercare following surgery of the circulatory system, NEC 12/04/2013   Angina decubitus ([place]) 05/19/2016   Arrhythmia 05/20/2016   Arthritis    ARTHRITIS, RIGHT FOOT 06/26/2008   Qualifier: Diagnosis of  By: Aline Brochure MD, [name]     Cardiomyopathy- EF NL 01/2013, now 30-35% echo 03/14/2012   Carotid artery occlusion    left s/p CEA   Cellulitis of left foot 05/07/2018   CHF (congestive heart failure) (HCC)    Critical lower limb ischemia ([place]) 02/28/2014   Critical limb ischemia    Diabetes mellitus    Elevated troponin 04/11/2016   GERD (gastroesophageal reflux disease)    History of stroke June 2013 03/14/2012   HOH (hard of hearing)    Hypertension    Hypothyroidism    Nonhealing skin ulcer ([place]) 10/28/2014   Obstructive sleep apnea    Orthostatic hypotension    Osteomyelitis (HCC)    left great toe   Pain in the chest 05/17/2016   Peripheral arterial disease ([place]), s/p PTA x 2 RLE    nonhealing ulcers bilaterally on each great  toe   Pneumonia    PONV (postoperative nausea and vomiting)    Pulmonary nodule 05/17/2016   Restless leg syndrome    Shortness of breath    Stroke [facility]) March 08, 2012   Toe osteomyelitis, left [facility])    Ulcer of great toe, left, with necrosis of bone ([place])    Wears dentures      Patient Active Problem List   Diagnosis Date Noted   Cervical myelopathy ([place]) 02/23/2022   Hospital discharge follow-up 11/26/2021   Hyponatremia 11/26/2021   Prolonged QT interval 11/16/2021   GERD (gastroesophageal reflux disease) 11/16/2021   Blurred vision 11/16/2021   Hypothyroidism 10/05/2021   Hypokalemia 05/18/2021   Muscular deconditioning 05/12/2021   Frequent falls 05/12/2021   Fatigue 04/29/2021   Recent unexplained weight loss 04/23/2021   Left shoulder pain 04/23/2021   Chest tightness 04/23/2021   Deep tissue injury 04/23/2021   Myoclonus 04/15/2021   Pneumonia 04/03/2021   PNA (pneumonia) 04/02/2021   Abnormal finding on urinalysis 04/01/2021   Syncope 02/16/2021   Minor head injury    Right hip pain 02/11/2021   Erectile dysfunction 01/22/2021   Gait abnormality 09/30/2020   Mild non proliferative diabetic retinopathy ([place]) 07/16/2020   Need for immunization against influenza 06/03/2020   S/P left knee arthroscopy 05/06/20 05/13/2020   Abnormal MRI 03/26/2020   Joint disorder of knee 03/26/2020   Fall at home, initial encounter 03/13/2020   Anemia, deficiency 09/03/2019   Reflux esophagitis  09/03/2019   Esophageal dysphagia 05/30/2019   Constipation 05/30/2019   Positive colorectal cancer screening using Cologuard test 05/30/2019   Diabetic foot ulcer ([place]) 09/06/2018   Orthostasis 08/25/2016   Congestive heart failure ([place]) 05/17/2016   Bilateral carotid artery disease ([place]) 07/08/2014   Obstructive sleep apnea 02/12/2014   Restless legs 10/23/2013   Overweight with body mass index (BMI) of 29 to 29.9 in adult 10/23/2013   Hyperlipidemia 07/16/2013   Peripheral  arterial disease ([place]) 07/16/2013   Hypertension 03/14/2012   Type 2 diabetes mellitus with circulatory disorder ([place]) 03/14/2012   Peripheral neuropathy 03/14/2012   Cardiomyopathy- EF NL 01/2013, now 30-35% echo 03/14/2012     Past Surgical History:  Procedure Laterality Date   AMPUTATION TOE Left 07/25/2018   Procedure: AMPUTATION TOE INTERPHALANGEAL HALLUX LEFT;  Surgeon: Evelina Bucy, DPM;  Location: [place];  Service: Podiatry;  Laterality: Left;   ANGIOPLASTY  02/28/14   diamond back orbital rotational atherectomy of Rt. tibial   BACK SURGERY     BONE BIOPSY Left 07/25/2018   Procedure: SUPERFICIAL BONE BIOPSY;  Surgeon: Evelina Bucy, DPM;  Location: [place];  Service: Podiatry;  Laterality: Left;   CARDIAC CATHETERIZATION N/A 05/19/2016   Procedure: Right/Left Heart Cath and Coronary Angiography;  Surgeon: Belva Crome, MD;  Location: [place] [facility];  Service: Cardiovascular;  Laterality: N/A;   CERVICAL FUSION     ENDARTERECTOMY Left 11/29/2013   Procedure: ENDARTERECTOMY CAROTID;  Surgeon: Serafina Mitchell, MD;  Location: [place];  Service: Vascular;  Laterality: Left;   ESOPHAGOGASTRODUODENOSCOPY  02/2010   Dr. Gala Romney: patient presented with food impaction, schatzki ring with superimposed component of stricture with erosive reflux esophagitis, s/p disimpaction but dilation planned at later date    ESOPHAGOGASTRODUODENOSCOPY (EGD) WITH PROPOFOL N/A 08/13/2019   Dr. Gala Romney: Erosive reflux esophagitis with mild stricture and incidental Mallory-Weiss tear which precluded esophageal dilation.  Medium sized hiatal hernia.   ESOPHAGOGASTRODUODENOSCOPY (EGD) WITH PROPOFOL N/A 10/11/2019   Dr. Gala Romney: Esophageal stenosis status post dilation, moderate hiatal hernia   EYE SURGERY     FLEXIBLE SIGMOIDOSCOPY N/A 08/13/2019   Procedure: FLEXIBLE SIGMOIDOSCOPY;  Surgeon: [name], MD;  [facility];  Service: Endoscopy;  Laterality: N/A;  colonoscopy aboerted due to formed  stool and poor prep   FOOT SURGERY     KNEE ARTHROSCOPY WITH MEDIAL MENISECTOMY Left 05/06/2020   Procedure: KNEE ARTHROSCOPY WITH MEDIAL MENISCECTOMY AND LATERAL MENISCECTOMY;  Surgeon: [name], MD;  Location: [facility];  Service: Orthopedics;  Laterality: Left;   Lower ext duplex doppler  03/14/14   Rt ABI 1.2   LOWER EXTREMITY ANGIOGRAM Bilateral 02/18/2014   Procedure: LOWER EXTREMITY ANGIOGRAM;  Surgeon: [name], MD;  Location: [facility];  Service: Cardiovascular;  Laterality: Bilateral;   LOWER EXTREMITY ANGIOGRAM N/A 10/31/2014   Procedure: LOWER EXTREMITY ANGIOGRAM;  Surgeon: [name], MD;  Location: [facility] C[facility];  Service: Cardiovascular;  Laterality: N/A;   MALONEY DILATION N/A 10/11/2019   Procedure: Venia Minks DILATION;  Surgeon: [name], MD;  [facility];  Service: Endoscopy;  Laterality: N/A;   MULTIPLE TOOTH EXTRACTIONS     PV angiogram  02/18/2014   tibial vessel diseas bil.   SPINE SURGERY     tendon achillies lengthing and sesamoid        Current Outpatient Medications:    doxycycline (VIBRAMYCIN) 100 MG capsule, Take 1 capsule (100 mg total) by mouth  2 (two) times daily for 10 days., Disp: 20 capsule, Rfl: 0   aspirin EC 325 MG EC tablet, Take 1 tablet (325 mg total) by mouth daily., Disp: 30 tablet, Rfl: 3   b complex vitamins tablet, Take 1 tablet by mouth daily., Disp: , Rfl:    carvedilol (COREG) 3.125 MG tablet, Take 0.5 tablets (1.5625 mg total) by mouth 2 (two) times daily with a meal., Disp: 180 tablet, Rfl: 3   Cholecalciferol (VITAMIN D3) 5000 units TABS, Take 5,000 Units by mouth daily at 6 (six) AM., Disp: , Rfl:    clopidogrel (PLAVIX) 75 MG tablet, TAKE ONE (1) TABLET BY MOUTH EVERY DAY (Patient taking differently: Take 75 mg by mouth daily.), Disp: 90 tablet, Rfl: 0   Continuous Blood Gluc Receiver (FREESTYLE LIBRE 14 DAY READER) DEVI, , Disp: , Rfl:    Continuous Blood Gluc Sensor (FREESTYLE LIBRE 14 DAY SENSOR) MISC,  APPLY ONE SENSOR ONTO SKIN EVERY 14 DAYS, Disp: 2 each, Rfl: 5   Cyanocobalamin 1500 MCG TBDP, Take 1,500 mcg by mouth daily. , Disp: , Rfl:    furosemide (LASIX) 40 MG tablet, TAKE ONE TABLET ('40MG'$  TOTAL) BY MOUTH TWO TIMES DAILY., Disp: 180 tablet, Rfl: 0   glimepiride (AMARYL) 4 MG tablet, TAKE ONE TABLET ('4MG'$  TOTAL) BY MOUTH DAILY WITH BREAKFAST, Disp: 90 tablet, Rfl: 1   levothyroxine (SYNTHROID) 88 MCG tablet, Take 1 tablet (88 mcg total) by mouth daily., Disp: 90 tablet, Rfl: 3   magnesium 30 MG tablet, Take 30 mg by mouth 2 (two) times daily., Disp: , Rfl:    Multiple Vitamin (MULTIVITAMIN WITH MINERALS) TABS tablet, Take 1 tablet by mouth daily., Disp: , Rfl:    nitroGLYCERIN (NITROSTAT) 0.4 MG SL tablet, Place 1 tablet (0.4 mg total) under the tongue every 5 (five) minutes as needed for chest pain., Disp: 25 tablet, Rfl: 3   pantoprazole (PROTONIX) 40 MG tablet, TAKE ONE TABLET BY MOUTH ONCE DAILY (Patient taking differently: Take 40 mg by mouth daily.), Disp: 90 tablet, Rfl: 1   potassium chloride (KLOR-CON) 10 MEQ tablet, TAKE ONE TABLET (10MEQ TOTAL) BY MOUTH DAILY (Patient taking differently: Take 10 mEq by mouth daily.), Disp: 90 tablet, Rfl: 3   rOPINIRole (REQUIP) 0.5 MG tablet, Take 0.5 mg by mouth 3 (three) times daily., Disp: , Rfl:    rosuvastatin (CRESTOR) 20 MG tablet, TAKE ONE TABLET ('20MG'$  TOTAL) BY MOUTH DAILY (Patient taking differently: Take 20 mg by mouth daily.), Disp: 90 tablet, Rfl: 1   UNABLE TO FIND, Home health supplies for CPAP, Disp: 1 Product, Rfl: 0   UNABLE TO FIND, Butler sock aid to help with support hose. Size XL.  Dx:I73.9, G25.81, R60.9, Disp: 1 each, Rfl: 0   Allergies  Allergen Reactions   Codeine Nausea And Vomiting   Tramadol Nausea Only     Social History   Occupational History   Occupation: Firefighter   Tobacco Use   Smoking status: Former    Types: Pipe    Quit date: 07/08/1977    Years since quitting: 44.6   Smokeless tobacco: Never   Vaping Use   Vaping Use: Never used  Substance and Sexual Activity   Alcohol use: No    Alcohol/week: 0.0 standard drinks   Drug use: No   Sexual activity: Not on file     Family History  Problem Relation Age of Onset   Heart disease Mother    Hypertension Mother    Heart attack Mother  Hypertension Father    Diabetes Father    Diabetes Son    Heart disease Son    Hypertension Son    Colon cancer Neg Hx      Immunization History  Administered Date(s) Administered   Fluad Quad(high Dose 65+) 06/03/2020, 10/05/2021   Moderna Sars-Covid-2 Vaccination 11/07/2019, 12/05/2019, 08/21/2020   PFIZER(Purple Top)SARS-COV-2 Vaccination 08/21/2020   Pneumococcal Conjugate-13 07/30/2020   Pneumococcal Polysaccharide-23 03/10/2012     Objective: There were no vitals filed for this visit. LEGRANDE HAO is a/an 76 y.o. male obese in NAD. AAO x 3.   Vascular Examination: CFT <4 seconds b/l LE. Faintly palpable DP pulses b/l LE. Diminished PT pulse(s) b/l LE. Pedal hair absent. No pain with calf compression b/l. Lower extremity skin temperature gradient within normal limits. Trace edema noted BLE. No ischemia or gangrene noted b/l LE. No cyanosis or clubbing noted b/l LE.  Dermatological Examination: No interdigital macerations noted b/l LE. Toenails 1-5 bilaterally elongated, discolored, dystrophic, thickened, and crumbly with subungual debris and tenderness to dorsal palpation.      Wound Location: submet head 1 right foot There is a moderate amount of devitalized tissue present in the wound. Predebridement Wound Measurement:  3.0  x 2.5 x 0 cm. Postdebridement Wound Measurement: 4.0 x 2.8 x 0.3 cm. Wound Base: Mixed Granular/Fibrotic Peri-wound: Normal Exudate: Scant/small amount Serosanguinous exudate Blood Loss during debridement: 0 cc('s). Material in wound which inhibits healing/promotes adjacent tissue breakdown:  necrotic tissue. Description of tissue removed from  ulceration today:  necrotic tissue. Sign(s) of clinical bacterial infection: pain on palpation  Musculoskeletal: Patient has active restless leg syndrome of LLE at today's visit. Lower extremity amputation(s): partial amputation of L hallux. Hammertoe(s) noted to the 2-5 bilaterally and R hallux.  Neurological: Protective sensation intact 5/5 intact bilaterally with 10g monofilament b/l.  Xray findings right foot No gas in tissues right foot. No bone erosion noted at location of ulceration submet head 1 right foot. No evidence of fracture right foot. Hardware remains intact 1st MPJ fusion. No foreign body evident right foot.     Latest Ref Rng & Units 10/06/2021   10:37 AM  Hemoglobin A1C  Hemoglobin-A1c 4.8 - 5.6 % 6.6     Assessment: 1. Onychomycosis   2. Diabetic ulcer of right foot associated with diabetes mellitus due to underlying condition, limited to breakdown of skin, unspecified part of foot ([place])   3. Status post amputation of left great toe ([place])   4. Diabetic peripheral neuropathy associated with type 2 diabetes mellitus ([place])   5. Orthopedic hardware present    Plan: -Patient was evaluated and treated and all questions answered.  -Patient/POA/Family member educated on diagnosis and treatment plan of routine ulcer debridement/wound care.  -Ulceration debridement achieved utilizing sharp debridement with sterile scalpel blade and sterile tissue nipper. Type of tissue removed: necrotic.  -Today's ulcer size post-debridement: 4.0 x 2.8 x 0.3 cm. -Ulceration cleansed with wound cleanser. Iodosorb Gel applied to base of ulceration and secured with light dressing. -Wound responded well to today's debridement. -Patient risk factors affecting healing of ulcer: recurrence, foot deformity, diabetes, h/o amputation, PAD -Frequency of debridement needed to achieve healing:  -[name] given printed instructions on daily wound care for right foot ulceration. -Rx for  Doxycyline 100 mg, #20, to be taken one capsule twice daily for 10 days.. -Frequency of debridements needed to achieve healing: weekly to biweekly. -Wound culture and sensitivity ordered today. -Consultations ordered today: Referral sent to  [facility] [phone]. This is patient's preference due to location close to home and existing relationship with facility. He is scheduled to see them on tomorrow for evaluation. -Infectious disease Consultation placed for [facility] due to recurrence of wound and comorbidities. -Radiology ordered today: due to recurrence of ulceration right foot with hardware, MRI right foot without contrast ordered to rule out low grade osteomyelitis. -Labs ordered today: CBC with diff, CRP, Sed rate, CMP -Mycotic toenails 2-5 bilaterally and R hallux were debrided in length and girth with sterile nail nippers and dremel without iatrogenic bleeding. -Patient/POA to call should there be question/concern in the interim. -He has upcoming surgery scheduled with Neurosurgery and I have asked him to notify surgeon of his foot wound. -Patient to follow up with Dr. Lorenda Peck in one week for re-evaluation of diabetic foot ulceration submet head 1 right foot.  Return in about 9 weeks (around 04/27/2022). Marzetta Board, DPM

## 2022-02-23 NOTE — Telephone Encounter (Signed)
Spoke with patient who is agreeable to see Almyra Deforest, PA on 6/13 at 1:55 pm.   I will fax recommendations to requesting provider's office

## 2022-02-23 NOTE — Telephone Encounter (Signed)
I left a message for the patient to return my call to schedule an appointment for a pre-op clearance.

## 2022-02-24 ENCOUNTER — Ambulatory Visit (HOSPITAL_COMMUNITY): Payer: HMO | Attending: Podiatry | Admitting: Physical Therapy

## 2022-02-24 ENCOUNTER — Encounter (HOSPITAL_COMMUNITY): Payer: Self-pay | Admitting: Physical Therapy

## 2022-02-24 DIAGNOSIS — R262 Difficulty in walking, not elsewhere classified: Secondary | ICD-10-CM

## 2022-02-24 DIAGNOSIS — E08621 Diabetes mellitus due to underlying condition with foot ulcer: Secondary | ICD-10-CM

## 2022-02-24 DIAGNOSIS — L97511 Non-pressure chronic ulcer of other part of right foot limited to breakdown of skin: Secondary | ICD-10-CM | POA: Diagnosis not present

## 2022-02-24 DIAGNOSIS — L97411 Non-pressure chronic ulcer of right heel and midfoot limited to breakdown of skin: Secondary | ICD-10-CM | POA: Diagnosis not present

## 2022-02-24 DIAGNOSIS — M79671 Pain in right foot: Secondary | ICD-10-CM | POA: Diagnosis not present

## 2022-02-24 NOTE — Therapy (Signed)
[place], [place], [address] Phone: [phone]   Fax:  [phone]  Wound Care Evaluation  Patient Details  Name: [name] MRN: [id number] Date of Birth: [birth year] Referring Provider (PT): Acey Lav DPM   Encounter Date: 02/24/2022   PT End of Session - 02/24/22 1316     Visit Number 1    Number of Visits 12    Date for PT Re-Evaluation 04/07/22    Authorization Type [employer], no VL, no auth ,    PT Start Time 8841    PT Stop Time 6606    PT Time Calculation (min) 40 min    Activity Tolerance Patient tolerated treatment well    Behavior During Therapy [facility] for tasks assessed/performed             Past Medical History:  Diagnosis Date   Aftercare following surgery of the circulatory system, NEC 12/04/2013   Angina decubitus ([place]) 05/19/2016   Arrhythmia 05/20/2016   Arthritis    ARTHRITIS, RIGHT FOOT 06/26/2008   Qualifier: Diagnosis of  By: Aline Brochure MD, [name]     Cardiomyopathy- EF NL 01/2013, now 30-35% echo 03/14/2012   Carotid artery occlusion    left s/p CEA   Cellulitis of left foot 05/07/2018   CHF (congestive heart failure) ([place])    Critical lower limb ischemia ([place]) 02/28/2014   Critical limb ischemia    Diabetes mellitus    Elevated troponin 04/11/2016   GERD (gastroesophageal reflux disease)    History of stroke June 2013 03/14/2012   HOH (hard of hearing)    Hypertension    Hypothyroidism    Nonhealing skin ulcer ([place]) 10/28/2014   Obstructive sleep apnea    Orthostatic hypotension    Osteomyelitis ([place])    left great toe   Pain in the chest 05/17/2016   Peripheral arterial disease ([place]), s/p PTA x 2 RLE    nonhealing ulcers bilaterally on each great toe   Pneumonia    PONV (postoperative nausea and vomiting)    Pulmonary nodule 05/17/2016   Restless leg syndrome    Shortness of breath    Stroke [facility]) March 08, 2012   Toe osteomyelitis, left [facility])    Ulcer  of great toe, left, with necrosis of bone ([place])    Wears dentures     Past Surgical History:  Procedure Laterality Date   AMPUTATION TOE Left 07/25/2018   Procedure: AMPUTATION TOE INTERPHALANGEAL HALLUX LEFT;  Surgeon: Evelina Bucy, DPM;  Location: [place];  Service: Podiatry;  Laterality: Left;   ANGIOPLASTY  02/28/14   diamond back orbital rotational atherectomy of Rt. tibial   BACK SURGERY     BONE BIOPSY Left 07/25/2018   Procedure: SUPERFICIAL BONE BIOPSY;  Surgeon: Evelina Bucy, DPM;  Location: [place];  Service: Podiatry;  Laterality: Left;   CARDIAC CATHETERIZATION N/A 05/19/2016   Procedure: Right/Left Heart Cath and Coronary Angiography;  Surgeon: Belva Crome, MD;  Location: [place] CV LAB;  Service: Cardiovascular;  Laterality: N/A;   CERVICAL FUSION     ENDARTERECTOMY Left 11/29/2013   Procedure: ENDARTERECTOMY CAROTID;  Surgeon: Serafina Mitchell, MD;  Location: [facility] OR;  Service: Vascular;  Laterality: Left;   ESOPHAGOGASTRODUODENOSCOPY  02/2010   Dr. Gala Romney: patient presented with food impaction, schatzki ring with superimposed component of stricture with erosive reflux esophagitis, s/p disimpaction but dilation planned at later date    ESOPHAGOGASTRODUODENOSCOPY (EGD) WITH PROPOFOL N/A 08/13/2019  Dr. Gala Romney: Erosive reflux esophagitis with mild stricture and incidental Mallory-Weiss tear which precluded esophageal dilation.  Medium sized hiatal hernia.   ESOPHAGOGASTRODUODENOSCOPY (EGD) WITH PROPOFOL N/A 10/11/2019   Dr. Gala Romney: Esophageal stenosis status post dilation, moderate hiatal hernia   EYE SURGERY     FLEXIBLE SIGMOIDOSCOPY N/A 08/13/2019   Procedure: FLEXIBLE SIGMOIDOSCOPY;  Surgeon: [name], MD;  [facility];  Service: Endoscopy;  Laterality: N/A;  colonoscopy aboerted due to formed stool and poor prep   FOOT SURGERY     KNEE ARTHROSCOPY WITH MEDIAL MENISECTOMY Left 05/06/2020   Procedure: KNEE ARTHROSCOPY WITH MEDIAL MENISCECTOMY AND LATERAL  MENISCECTOMY;  Surgeon: [name], MD;  [facility];  Service: Orthopedics;  Laterality: Left;   Lower ext duplex doppler  03/14/14   Rt ABI 1.2   LOWER EXTREMITY ANGIOGRAM Bilateral 02/18/2014   Procedure: LOWER EXTREMITY ANGIOGRAM;  Surgeon: [name], MD;  Location: [facility] CATH LAB;  Service: Cardiovascular;  Laterality: Bilateral;   LOWER EXTREMITY ANGIOGRAM N/A 10/31/2014   Procedure: LOWER EXTREMITY ANGIOGRAM;  Surgeon: [name], MD;  Location: [facility] CATH LAB;  Service: Cardiovascular;  Laterality: N/A;   MALONEY DILATION N/A 10/11/2019   Procedure: Venia Minks DILATION;  Surgeon: [name], MD;  [facility];  Service: Endoscopy;  Laterality: N/A;   MULTIPLE TOOTH EXTRACTIONS     PV angiogram  02/18/2014   tibial vessel diseas bil.   SPINE SURGERY     tendon achillies lengthing and sesamoid      There were no vitals filed for this visit.     [facility] PT Assessment - 02/24/22 0001       Assessment   Medical Diagnosis Diabetic ulcer of R foot    Referring Provider (PT) Jennifier Galaway DPM             Wound Therapy - 02/24/22 0001     Subjective Patient states that about a week ago he started having pain in his foot. He went to podiatrist follow up for his nails and they cleaned foot to reveal wound present. Wound has been healed on and off for years.    Patient and Family Stated Goals wound to heal    Date of Onset 02/17/22    Prior Treatments podiatrist    Pain Score 4     Evaluation and Treatment Procedures Explained to Patient/Family Yes    Evaluation and Treatment Procedures agreed to    Wound Properties Date First Assessed: 02/24/22 Time First Assessed: 1315 Wound Type: Diabetic ulcer Location: Foot Location Orientation: Right Wound Description (Comments): R plantar foot wound Present on Admission: Yes   Wound Image Images linked: 1    Dressing Type Gauze (Comment)    Dressing Changed Changed    Dressing Status Clean, Dry, Intact     Dressing Change Frequency PRN    Site / Wound Assessment Red;Pink    % Wound base Red or Granulating 100%    Wound Length (cm) 0.6 cm    Wound Width (cm) 0.4 cm    Wound Depth (cm) 0.3 cm    Wound Volume (cm^3) 0.07 cm^3    Wound Surface Area (cm^2) 0.24 cm^2    Drainage Amount Scant    Treatment Cleansed;Debridement (Selective)    Selective Debridement - Location callus plantar met head region    Selective Debridement - Tools Used Scalpel    Selective Debridement - Tissue Removed callous devitalized tissue.    Wound Therapy -  Clinical Statement [name] is a well known pt in this clinic. Patient with foot wound dressed from podiatrist yesterday. Patient with some callous surrounding wound and he tolerates debridement well. Dressed wound with vaseline to perimeter, medihoney to wound bed followed by 2x2 and medipore tape to secure.    Wound Therapy - Functional Problem List difficulty walking, washing, dressing    Factors Delaying/Impairing Wound Healing Diabetes Mellitus;Immobility;Multiple medical problems;Vascular compromise    Wound Plan debride and dressing changes as needed    Dressing  vaseline, medihoney, 2x2, medipore                   Objective measurements completed on examination: See above findings.              PT Long Term Goals - 02/24/22 1419       PT LONG TERM GOAL #1   Title Patient will be independent in self management strategies to decrease callous formation    Time 6    Period Weeks    Status New    Target Date 04/07/22      PT LONG TERM GOAL #2   Title Patient wound to be healed to reduce risk of infection.    Time 6    Period Weeks    Status New    Target Date 04/07/22                   Plan - 02/24/22 1415     Clinical Impression Statement see above    Personal Factors and Comorbidities Comorbidity 3+    Comorbidities CHF, left knee surgery, right chronic foot wound, DB, restless leg syndromes     Examination-Activity Limitations Locomotion Level;Transfers;Squat;Stairs    Examination-Participation Restrictions Cleaning;Community Activity;Meal Prep;Volunteer;Yard Work;Shop    Stability/Clinical Decision Making Stable/Uncomplicated    Clinical Decision Making Low    Rehab Potential Good    PT Frequency 2x / week    PT Duration 6 weeks    PT Treatment/Interventions ADLs/Self Care Home Management;Gait training;Stair training;DME Instruction;Functional mobility training;Therapeutic exercise;Therapeutic activities;Contrast Bath;Neuromuscular re-education;Patient/family education;Orthotic Fit/Training;Manual techniques;Manual lymph drainage;Compression bandaging;Scar mobilization;Energy conservation    Consulted and Agree with Plan of Care Patient             Patient will benefit from skilled therapeutic intervention in order to improve the following deficits and impairments:  Abnormal gait, Difficulty walking, Decreased endurance, Decreased activity tolerance, Pain, Decreased balance, Decreased skin integrity, Decreased mobility  Visit Diagnosis: Pain in right foot  Difficulty in walking, not elsewhere classified  Diabetic ulcer of right midfoot associated with diabetes mellitus due to underlying condition, limited to breakdown of skin [facility])    Problem List Patient Active Problem List   Diagnosis Date Noted   Cervical myelopathy ([place]) 02/23/2022   Hospital discharge follow-up 11/26/2021   Hyponatremia 11/26/2021   Prolonged QT interval 11/16/2021   GERD (gastroesophageal reflux disease) 11/16/2021   Blurred vision 11/16/2021   Hypothyroidism 10/05/2021   Hypokalemia 05/18/2021   Muscular deconditioning 05/12/2021   Frequent falls 05/12/2021   Fatigue 04/29/2021   Recent unexplained weight loss 04/23/2021   Left shoulder pain 04/23/2021   Chest tightness 04/23/2021   Deep tissue injury 04/23/2021   Myoclonus 04/15/2021   Pneumonia 04/03/2021   PNA (pneumonia) 04/02/2021    Abnormal finding on urinalysis 04/01/2021   Syncope 02/16/2021   Minor head injury    Right hip pain 02/11/2021   Erectile dysfunction 01/22/2021   Gait abnormality 09/30/2020   Mild  non proliferative diabetic retinopathy ([place]) 07/16/2020   Need for immunization against influenza 06/03/2020   S/P left knee arthroscopy 05/06/20 05/13/2020   Abnormal MRI 03/26/2020   Joint disorder of knee 03/26/2020   Fall at home, initial encounter 03/13/2020   Anemia, deficiency 09/03/2019   Reflux esophagitis 09/03/2019   Esophageal dysphagia 05/30/2019   Constipation 05/30/2019   Positive colorectal cancer screening using Cologuard test 05/30/2019   Diabetic foot ulcer ([place]) 09/06/2018   Orthostasis 08/25/2016   Congestive heart failure ([place]) 05/17/2016   Bilateral carotid artery disease ([place]) 07/08/2014   Obstructive sleep apnea 02/12/2014   Restless legs 10/23/2013   Overweight with body mass index (BMI) of 29 to 29.9 in adult 10/23/2013   Hyperlipidemia 07/16/2013   Peripheral arterial disease ([place]) 07/16/2013   Hypertension 03/14/2012   Type 2 diabetes mellitus with circulatory disorder ([place]) 03/14/2012   Peripheral neuropathy 03/14/2012   Cardiomyopathy- EF NL 01/2013, now 30-35% echo 03/14/2012    Mearl Latin, PT 02/24/2022, 2:35 PM  [place] [address] [place], [place], [address] Phone: [phone]   Fax:  [phone]  Name: [name] MRN: [id number] Date of Birth: 1946-03-01

## 2022-02-25 LAB — CBC WITH DIFFERENTIAL/PLATELET
Basophils Absolute: 0 10*3/uL (ref 0.0–0.2)
Basos: 1 %
EOS (ABSOLUTE): 0 10*3/uL (ref 0.0–0.4)
Eos: 1 %
Hematocrit: 33.5 % — ABNORMAL LOW (ref 37.5–51.0)
Hemoglobin: 11.4 g/dL — ABNORMAL LOW (ref 13.0–17.7)
Immature Grans (Abs): 0 10*3/uL (ref 0.0–0.1)
Immature Granulocytes: 0 %
Lymphocytes Absolute: 1 10*3/uL (ref 0.7–3.1)
Lymphs: 25 %
MCH: 33.6 pg — ABNORMAL HIGH (ref 26.6–33.0)
MCHC: 34 g/dL (ref 31.5–35.7)
MCV: 99 fL — ABNORMAL HIGH (ref 79–97)
Monocytes Absolute: 0.3 10*3/uL (ref 0.1–0.9)
Monocytes: 8 %
Neutrophils Absolute: 2.6 10*3/uL (ref 1.4–7.0)
Neutrophils: 65 %
Platelets: 171 10*3/uL (ref 150–450)
RBC: 3.39 x10E6/uL — ABNORMAL LOW (ref 4.14–5.80)
RDW: 15.5 % — ABNORMAL HIGH (ref 11.6–15.4)
WBC: 3.9 10*3/uL (ref 3.4–10.8)

## 2022-02-25 LAB — COMPREHENSIVE METABOLIC PANEL
ALT: 12 IU/L (ref 0–44)
AST: 15 IU/L (ref 0–40)
Albumin/Globulin Ratio: 1.7 (ref 1.2–2.2)
Albumin: 4.5 g/dL (ref 3.7–4.7)
Alkaline Phosphatase: 77 IU/L (ref 44–121)
BUN/Creatinine Ratio: 15 (ref 10–24)
BUN: 14 mg/dL (ref 8–27)
Bilirubin Total: 0.7 mg/dL (ref 0.0–1.2)
CO2: 25 mmol/L (ref 20–29)
Calcium: 9 mg/dL (ref 8.6–10.2)
Chloride: 103 mmol/L (ref 96–106)
Creatinine, Ser: 0.91 mg/dL (ref 0.76–1.27)
Globulin, Total: 2.7 g/dL (ref 1.5–4.5)
Glucose: 177 mg/dL — ABNORMAL HIGH (ref 70–99)
Potassium: 4.3 mmol/L (ref 3.5–5.2)
Sodium: 139 mmol/L (ref 134–144)
Total Protein: 7.2 g/dL (ref 6.0–8.5)
eGFR: 86 mL/min/{1.73_m2} (ref >59)

## 2022-02-25 LAB — C-REACTIVE PROTEIN: CRP: <1 mg/L (ref 0–10)

## 2022-02-25 LAB — SEDIMENTATION RATE: Sed Rate: 2 mm/hr (ref 0–30)

## 2022-02-26 ENCOUNTER — Ambulatory Visit (HOSPITAL_COMMUNITY): Payer: HMO | Attending: Podiatry | Admitting: Physical Therapy

## 2022-02-26 DIAGNOSIS — Z978 Presence of other specified devices: Secondary | ICD-10-CM | POA: Insufficient documentation

## 2022-02-26 DIAGNOSIS — L97511 Non-pressure chronic ulcer of other part of right foot limited to breakdown of skin: Secondary | ICD-10-CM | POA: Diagnosis not present

## 2022-02-26 DIAGNOSIS — M79671 Pain in right foot: Secondary | ICD-10-CM

## 2022-02-26 DIAGNOSIS — E11621 Type 2 diabetes mellitus with foot ulcer: Secondary | ICD-10-CM | POA: Diagnosis present

## 2022-02-26 DIAGNOSIS — E08621 Diabetes mellitus due to underlying condition with foot ulcer: Secondary | ICD-10-CM

## 2022-02-26 DIAGNOSIS — R262 Difficulty in walking, not elsewhere classified: Secondary | ICD-10-CM

## 2022-02-26 LAB — WOUND CULTURE
MICRO NUMBER:: 13490577
SPECIMEN QUALITY:: ADEQUATE

## 2022-02-26 NOTE — Therapy (Signed)
[place], [place], [address] Phone: [phone]   Fax:  [phone]  Wound Care Therapy  Patient Details  Name: [name] MRN: [id number] Date of Birth: 01/30/1946 Referring Provider (PT): Acey Lav DPM   Encounter Date: 02/26/2022   PT End of Session - 02/26/22 1316     Visit Number 2    Number of Visits 12    Date for PT Re-Evaluation 04/07/22    Authorization Type [employer], no VL, no auth ,    PT Start Time 1320    PT Stop Time 1400    PT Time Calculation (min) 40 min    Activity Tolerance Patient tolerated treatment well    Behavior During Therapy [facility] for tasks assessed/performed             Past Medical History:  Diagnosis Date   Aftercare following surgery of the circulatory system, NEC 12/04/2013   Angina decubitus ([place]) 05/19/2016   Arrhythmia 05/20/2016   Arthritis    ARTHRITIS, RIGHT FOOT 06/26/2008   Qualifier: Diagnosis of  By: Aline Brochure MD, [name]     Cardiomyopathy- EF NL 01/2013, now 30-35% echo 03/14/2012   Carotid artery occlusion    left s/p CEA   Cellulitis of left foot 05/07/2018   CHF (congestive heart failure) ([place])    Critical lower limb ischemia ([place]) 02/28/2014   Critical limb ischemia    Diabetes mellitus    Elevated troponin 04/11/2016   GERD (gastroesophageal reflux disease)    History of stroke June 2013 03/14/2012   HOH (hard of hearing)    Hypertension    Hypothyroidism    Nonhealing skin ulcer ([place]) 10/28/2014   Obstructive sleep apnea    Orthostatic hypotension    Osteomyelitis ([place])    left great toe   Pain in the chest 05/17/2016   Peripheral arterial disease ([place]), s/p PTA x 2 RLE    nonhealing ulcers bilaterally on each great toe   Pneumonia    PONV (postoperative nausea and vomiting)    Pulmonary nodule 05/17/2016   Restless leg syndrome    Shortness of breath    Stroke [facility]) March 08, 2012   Toe osteomyelitis, left [facility])    Ulcer of  great toe, left, with necrosis of bone ([place])    Wears dentures     Past Surgical History:  Procedure Laterality Date   AMPUTATION TOE Left 07/25/2018   Procedure: AMPUTATION TOE INTERPHALANGEAL HALLUX LEFT;  Surgeon: Evelina Bucy, DPM;  Location: [place];  Service: Podiatry;  Laterality: Left;   ANGIOPLASTY  02/28/14   diamond back orbital rotational atherectomy of Rt. tibial   BACK SURGERY     BONE BIOPSY Left 07/25/2018   Procedure: SUPERFICIAL BONE BIOPSY;  Surgeon: Evelina Bucy, DPM;  Location: [place];  Service: Podiatry;  Laterality: Left;   CARDIAC CATHETERIZATION N/A 05/19/2016   Procedure: Right/Left Heart Cath and Coronary Angiography;  Surgeon: Belva Crome, MD;  Location: [place] CV LAB;  Service: Cardiovascular;  Laterality: N/A;   CERVICAL FUSION     ENDARTERECTOMY Left 11/29/2013   Procedure: ENDARTERECTOMY CAROTID;  Surgeon: Serafina Mitchell, MD;  Location[facility] OR;  Service: Vascular;  Laterality: Left;   ESOPHAGOGASTRODUODENOSCOPY  02/2010   Dr. Gala Romney: patient presented with food impaction, schatzki ring with superimposed component of stricture with erosive reflux esophagitis, s/p disimpaction but dilation planned at later date    ESOPHAGOGASTRODUODENOSCOPY (EGD) WITH PROPOFOL N/A 08/13/2019  Dr. Gala Romney: Erosive reflux esophagitis with mild stricture and incidental Mallory-Weiss tear which precluded esophageal dilation.  Medium sized hiatal hernia.   ESOPHAGOGASTRODUODENOSCOPY (EGD) WITH PROPOFOL N/A 10/11/2019   Dr. Gala Romney: Esophageal stenosis status post dilation, moderate hiatal hernia   EYE SURGERY     FLEXIBLE SIGMOIDOSCOPY N/A 08/13/2019   Procedure: FLEXIBLE SIGMOIDOSCOPY;  Surgeon: [name], MD;  [facility];  Service: Endoscopy;  Laterality: N/A;  colonoscopy aboerted due to formed stool and poor prep   FOOT SURGERY     KNEE ARTHROSCOPY WITH MEDIAL MENISECTOMY Left 05/06/2020   Procedure: KNEE ARTHROSCOPY WITH MEDIAL MENISCECTOMY AND LATERAL  MENISCECTOMY;  Surgeon: [name], MD;  [facility];  Service: Orthopedics;  Laterality: Left;   Lower ext duplex doppler  03/14/14   Rt ABI 1.2   LOWER EXTREMITY ANGIOGRAM Bilateral 02/18/2014   Procedure: LOWER EXTREMITY ANGIOGRAM;  Surgeon: [name], MD;  Location: [facility] CATH LAB;  Service: Cardiovascular;  Laterality: Bilateral;   LOWER EXTREMITY ANGIOGRAM N/A 10/31/2014   Procedure: LOWER EXTREMITY ANGIOGRAM;  Surgeon: [name], MD;  Location: [facility] CATH LAB;  Service: Cardiovascular;  Laterality: N/A;   MALONEY DILATION N/A 10/11/2019   Procedure: Venia Minks DILATION;  Surgeon: [name], MD;  [facility];  Service: Endoscopy;  Laterality: N/A;   MULTIPLE TOOTH EXTRACTIONS     PV angiogram  02/18/2014   tibial vessel diseas bil.   SPINE SURGERY     tendon achillies lengthing and sesamoid      There were no vitals filed for this visit.               Wound Therapy - 02/26/22 0001     Subjective PT states that he is having cervical surgery on 03/11/22.  States that his foot does not hutt.    Patient and Family Stated Goals wound to heal    Date of Onset 02/17/22    Prior Treatments podiatrist    Pain Score 0-No pain    Evaluation and Treatment Procedures Explained to Patient/Family Yes    Evaluation and Treatment Procedures agreed to    Wound Properties Date First Assessed: 02/24/22 Time First Assessed: 1315 Wound Type: Diabetic ulcer Location: Foot Location Orientation: Right Wound Description (Comments): R plantar foot wound Present on Admission: Yes   Dressing Type Adhesive strips    Dressing Changed Changed    Dressing Status Clean, Dry, Intact    Dressing Change Frequency PRN    Site / Wound Assessment Red;Pink    % Wound base Red or Granulating 100%    Drainage Amount None    Treatment Cleansed;Debridement (Selective)    Selective Debridement - Location callus plantar met head region    Selective Debridement - Tools Used  Scalpel;Forceps;Scissors    Selective Debridement - Tissue Removed callous devitalized tissue.    Wound Therapy - Clinical Statement Significant amount of callous was able to be removed today to assist with healing of wound.  Pt unable to care for wound due to location.  Pt will continue to benefit from skilled therapy to remove callous to allow a healing environment.    Wound Therapy - Functional Problem List difficulty walking, washing, dressing    Factors Delaying/Impairing Wound Healing Diabetes Mellitus;Immobility;Multiple medical problems;Vascular compromise    Wound Plan debride and dressing changes as needed    Dressing  vaseline, medihoney, 2x2, medipore  PT Short Term Goals - 02/26/22 1351       PT SHORT TERM GOAL #1   Title Patient will be independent in self management strategies to decrease callous formation    Time 1    Period Days    Status Achieved    Target Date 10/05/21               PT Long Term Goals - 02/26/22 1351       PT LONG TERM GOAL #1   Title Patient will be independent in self management strategies to decrease callous formation    Time 6    Period Weeks    Status On-going    Target Date 04/07/22      PT LONG TERM GOAL #2   Title Patient wound to be healed to reduce risk of infection.    Time 6    Period Weeks    Status On-going    Target Date 04/07/22                   Plan - 02/26/22 1352     Clinical Impression Statement as above    Personal Factors and Comorbidities Comorbidity 3+    Comorbidities CHF, left knee surgery, right chronic foot wound, DB, restless leg syndromes    Examination-Activity Limitations Locomotion Level;Transfers;Squat;Stairs    Examination-Participation Restrictions Cleaning;Community Activity;Meal Prep;Volunteer;Yard Work;Shop    Stability/Clinical Decision Making Stable/Uncomplicated    Rehab Potential Good    PT Frequency 2x / week    PT Duration 6 weeks    PT  Treatment/Interventions ADLs/Self Care Home Management;Gait training;Stair training;DME Instruction;Functional mobility training;Therapeutic exercise;Therapeutic activities;Contrast Bath;Neuromuscular re-education;Patient/family education;Orthotic Fit/Training;Manual techniques;Manual lymph drainage;Compression bandaging;Scar mobilization;Energy conservation    PT Next Visit Plan continue with debridement of callous until wound is healed/.    Consulted and Agree with Plan of Care Patient             Patient will benefit from skilled therapeutic intervention in order to improve the following deficits and impairments:  Abnormal gait, Difficulty walking, Decreased endurance, Decreased activity tolerance, Pain, Decreased balance, Decreased skin integrity, Decreased mobility  Visit Diagnosis: Pain in right foot  Difficulty in walking, not elsewhere classified  Diabetic ulcer of right midfoot associated with diabetes mellitus due to underlying condition, limited to breakdown of skin [facility])     Problem List Patient Active Problem List   Diagnosis Date Noted   Cervical myelopathy ([place]) 02/23/2022   Hospital discharge follow-up 11/26/2021   Hyponatremia 11/26/2021   Prolonged QT interval 11/16/2021   GERD (gastroesophageal reflux disease) 11/16/2021   Blurred vision 11/16/2021   Hypothyroidism 10/05/2021   Hypokalemia 05/18/2021   Muscular deconditioning 05/12/2021   Frequent falls 05/12/2021   Fatigue 04/29/2021   Recent unexplained weight loss 04/23/2021   Left shoulder pain 04/23/2021   Chest tightness 04/23/2021   Deep tissue injury 04/23/2021   Myoclonus 04/15/2021   Pneumonia 04/03/2021   PNA (pneumonia) 04/02/2021   Abnormal finding on urinalysis 04/01/2021   Syncope 02/16/2021   Minor head injury    Right hip pain 02/11/2021   Erectile dysfunction 01/22/2021   Gait abnormality 09/30/2020   Mild non proliferative diabetic retinopathy ([place]) 07/16/2020   Need for  immunization against influenza 06/03/2020   S/P left knee arthroscopy 05/06/20 05/13/2020   Abnormal MRI 03/26/2020   Joint disorder of knee 03/26/2020   Fall at home, initial encounter 03/13/2020   Anemia, deficiency 09/03/2019   Reflux esophagitis 09/03/2019   Esophageal  dysphagia 05/30/2019   Constipation 05/30/2019   Positive colorectal cancer screening using Cologuard test 05/30/2019   Diabetic foot ulcer ([place]) 09/06/2018   Orthostasis 08/25/2016   Congestive heart failure ([place]) 05/17/2016   Bilateral carotid artery disease ([place]) 07/08/2014   Obstructive sleep apnea 02/12/2014   Restless legs 10/23/2013   Overweight with body mass index (BMI) of 29 to 29.9 in adult 10/23/2013   Hyperlipidemia 07/16/2013   Peripheral arterial disease ([place]) 07/16/2013   Hypertension 03/14/2012   Type 2 diabetes mellitus with circulatory disorder ([place]) 03/14/2012   Peripheral neuropathy 03/14/2012   Cardiomyopathy- EF NL 01/2013, now 30-35% echo 03/14/2012  Rayetta Humphrey, PT CLT [phone]  02/26/2022, 1:52 PM  [place] [address] [place], [place], [address] Phone: [phone]   Fax:  [phone]  Name: [name] MRN: [id number] Date of Birth: 08-13-1946

## 2022-03-01 ENCOUNTER — Telehealth (HOSPITAL_COMMUNITY): Payer: Self-pay | Admitting: Physical Therapy

## 2022-03-01 NOTE — Telephone Encounter (Signed)
Patient will have back surgery on 6/22--and in the hospitla until 6/25. He will need PT following back surgery with the order coming from a different MD.

## 2022-03-02 ENCOUNTER — Ambulatory Visit: Payer: HMO | Admitting: Podiatry

## 2022-03-02 ENCOUNTER — Ambulatory Visit (INDEPENDENT_AMBULATORY_CARE_PROVIDER_SITE_OTHER): Payer: HMO

## 2022-03-02 ENCOUNTER — Encounter: Payer: Self-pay | Admitting: Podiatry

## 2022-03-02 ENCOUNTER — Ambulatory Visit (INDEPENDENT_AMBULATORY_CARE_PROVIDER_SITE_OTHER): Payer: HMO | Admitting: Physician Assistant

## 2022-03-02 ENCOUNTER — Encounter: Payer: Self-pay | Admitting: Physician Assistant

## 2022-03-02 VITALS — BP 132/66 | HR 69 | Ht 72.0 in | Wt 211.2 lb

## 2022-03-02 DIAGNOSIS — I739 Peripheral vascular disease, unspecified: Secondary | ICD-10-CM

## 2022-03-02 DIAGNOSIS — I10 Essential (primary) hypertension: Secondary | ICD-10-CM

## 2022-03-02 DIAGNOSIS — E785 Hyperlipidemia, unspecified: Secondary | ICD-10-CM

## 2022-03-02 DIAGNOSIS — L97511 Non-pressure chronic ulcer of other part of right foot limited to breakdown of skin: Secondary | ICD-10-CM

## 2022-03-02 DIAGNOSIS — I2510 Atherosclerotic heart disease of native coronary artery without angina pectoris: Secondary | ICD-10-CM | POA: Diagnosis not present

## 2022-03-02 DIAGNOSIS — Z01818 Encounter for other preprocedural examination: Secondary | ICD-10-CM | POA: Diagnosis not present

## 2022-03-02 DIAGNOSIS — E1159 Type 2 diabetes mellitus with other circulatory complications: Secondary | ICD-10-CM

## 2022-03-02 DIAGNOSIS — E1142 Type 2 diabetes mellitus with diabetic polyneuropathy: Secondary | ICD-10-CM

## 2022-03-02 DIAGNOSIS — E08621 Diabetes mellitus due to underlying condition with foot ulcer: Secondary | ICD-10-CM | POA: Diagnosis not present

## 2022-03-02 DIAGNOSIS — Z89412 Acquired absence of left great toe: Secondary | ICD-10-CM

## 2022-03-02 MED ORDER — NITROGLYCERIN 0.4 MG SL SUBL: 0.4000 mg | SUBLINGUAL_TABLET | SUBLINGUAL | 3 refills | Status: AC | PRN

## 2022-03-02 NOTE — Progress Notes (Signed)
Subjective:  Patient ID: [name], male    DOB: 1946/06/21,   MRN: [id number]  No chief complaint on file.   76 y.o. male presents for follow-up of right foot wound that has come and gone over the years. Follows with Dr. Elisha Ponder for rfc and wound had opened and referred to me for care. Labs and MRI ordered. Has been taking doxycycline. Has follow-up with wound care and infectious disease later this week.  . Denies any other pedal complaints. Denies n/v/f/c.   Past Medical History:  Diagnosis Date   Aftercare following surgery of the circulatory system, NEC 12/04/2013   Angina decubitus ([place]) 05/19/2016   Arrhythmia 05/20/2016   Arthritis    ARTHRITIS, RIGHT FOOT 06/26/2008   Qualifier: Diagnosis of  By: Aline Brochure MD, [name]     Cardiomyopathy- EF NL 01/2013, now 30-35% echo 03/14/2012   Carotid artery occlusion    left s/p CEA   Cellulitis of left foot 05/07/2018   CHF (congestive heart failure) (HCC)    Critical lower limb ischemia ([place]) 02/28/2014   Critical limb ischemia    Diabetes mellitus    Elevated troponin 04/11/2016   GERD (gastroesophageal reflux disease)    History of stroke June 2013 03/14/2012   HOH (hard of hearing)    Hypertension    Hypothyroidism    Nonhealing skin ulcer ([place]) 10/28/2014   Obstructive sleep apnea    Orthostatic hypotension    Osteomyelitis (HCC)    left great toe   Pain in the chest 05/17/2016   Peripheral arterial disease ([place]), s/p PTA x 2 RLE    nonhealing ulcers bilaterally on each great toe   Pneumonia    PONV (postoperative nausea and vomiting)    Pulmonary nodule 05/17/2016   Restless leg syndrome    Shortness of breath    Stroke [facility]) March 08, 2012   Toe osteomyelitis, left (HCC)    Ulcer of great toe, left, with necrosis of bone (HCC)    Wears dentures     Objective:  Physical Exam: Vascular: DP/PT pulses 2/4 bilateral. CFT <3 seconds. Absent hair growth on digits. Edema noted to bilateral lower extremities. Xerosis noted  bilaterally.  Skin. No lacerations or abrasions bilateral feet. Nails 1-5 bilateral  are thickened discolored and elongated with subungual debris. Right plantar first metatarsal wound 0.1 cm x 0.1 cm x 0.1 cm with granular base and surrounding hyperkeratosis. No erythema edema or purulence noted.  Musculoskeletal: MMT 5/5 bilateral lower extremities in DF, PF, Inversion and Eversion. Deceased ROM in DF of ankle joint.  Neurological: Sensation intact to light touch. Protective sensation diminished bilateral.    Assessment:   1. Diabetic ulcer of right foot associated with diabetes mellitus due to underlying condition, limited to breakdown of skin, unspecified part of foot ([place])      Plan:  Patient was evaluated and treated and all questions answered. Ulcer right foot limited to breakdown of skin  -Debridement as below. -Dressed with neosporin, DSD. -Off-loading with inserts and offloaded area.  -Finishe out abx.  -MRI not done.  -Labs within normal limits.  -Follow-up with infectious disease and wound care.  -Discussed glucose control and proper protein-rich diet.  -Discussed if any worsening redness, pain, fever or chills to call or may need to report to the emergency room. Patient expressed understanding.   Procedure: Excisional Debridement of Wound Rationale: Removal of non-viable soft tissue from the wound to promote healing.  Anesthesia: none Pre-Debridement Wound Measurements: overlying callus  Post-Debridement Wound Measurements: 0.1 cm x 0.1 cm x 0.1 cm  Type of Debridement: Sharp Excisional Tissue Removed: Non-viable soft tissue Depth of Debridement: subcutaneous tissue. Technique: Sharp excisional debridement to bleeding, viable wound base.  Dressing: Dry, sterile, compression dressing. Disposition: Patient tolerated procedure well. Patient to return in 3 week for follow-up.  Return in about 3 weeks (around 03/23/2022) for wound check.   Lorenda Peck, DPM

## 2022-03-02 NOTE — Telephone Encounter (Signed)
    Patient Name: [name]  DOB: Apr 04, 1946 MRN: [id number]  Primary Cardiologist: Quay Burow, MD  Chart reviewed as part of pre-operative protocol coverage. Given past medical history and time since last visit, based on ACC/AHA guidelines, AJMAL KATHAN would be at acceptable risk for the planned procedure without further cardiovascular testing.   Patient was seen in the cardiology office on 03/02/2022.  He has a history of moderate severe distal vessel CAD seen on previous cardiac catheterization in 2017.  He denies any recent exertional chest pain or worsening dyspnea than usual.  I discussed the case with DOD Dr. Harriet Masson, nuclear stress test is unlikely to be helpful in this case given the history of severe distal vessel disease.  Given lack of chest pain and nonobstructive moderate disease seen on the proximal vessels on the previous cath, patient is cleared to proceed with upcoming neck surgery.  He may hold Plavix for 7 days prior to the procedure and restart as once possible afterward at the surgeon's discretion.  Neurology service increased his aspirin to 325 mg daily in February due to concern of tiny brainstem stroke that was not seen on MRI, will defer to neurology to decide whether or not the patient can hold aspirin as well.  The patient was advised that if he develops new symptoms prior to surgery to contact our office to arrange for a follow-up visit, and he verbalized understanding.  I will route this recommendation to the requesting party via Epic fax function and remove from pre-op pool.  Please call with questions.  [place], [place] 03/02/2022, 2:38 PM

## 2022-03-02 NOTE — Patient Instructions (Signed)
Medication Instructions:  HOLD Plavix for 7 days prior.  Check with Neurology office to ask about holding Asprin   *If you need a refill on your cardiac medications before your next appointment, please call your pharmacy*  Lab Work: NONE ordered at this time of appointment   If you have labs (blood work) drawn today and your tests are completely normal, you will receive your results only by: [place] (if you have MyChart) OR A paper copy in the mail If you have any lab test that is abnormal or we need to change your treatment, we will call you to review the results.  Testing/Procedures: NONE ordered at this time of appointment   Follow-Up: At [facility], you and your health needs are our priority.  As part of our continuing mission to provide you with exceptional heart care, we have created designated Provider Care Teams.  These Care Teams include your primary Cardiologist (physician) and Advanced Practice Providers (APPs -  Physician Assistants and Nurse Practitioners) who all work together to provide you with the care you need, when you need it.   Your next appointment:   6 month(s)  The format for your next appointment:   In Person  Provider:   Quay Burow, MD     Other Instructions   Important Information About Sugar

## 2022-03-02 NOTE — Progress Notes (Signed)
Cardiology Office Note:    Date:  03/04/2022   ID:  [name], DOB 03-29-1946, MRN [id number]  PCP:  Christopher Rival, FNP   [facility] Providers Cardiologist:  Christopher Burow, MD     Referring MD: Christopher Rival, FNP   Chief Complaint  Patient presents with   Follow-up    Seen for [name]    History of Present Illness:    [name] is a 76 y.o. male with a hx of PAD, hypertension, hyperlipidemia, OSA, coronary artery disease and a history of cardiomyopathy.  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  He had a CVA in June 2013.  Carotid disease he has been followed by Dr. Trula [name].  Dr. Alvester [name] performed angiography in June 2015 that showed severe tibial disease treated with orbital rotational atherectomy.  ABI in June 2017 was normal.  He was admitted with congestive heart failure in August 2017 and underwent diuresis.  EF was 30 to 35% at the time.  He underwent left and right heart cath by Dr. Tamala Julian that showed elevated LVEDP and moderate diffuse CAD.  Medical therapy was recommended.  ABI obtained in December 2021 was normal.  Last echocardiogram obtained on 02/16/2021 showed EF 55 to 60%, mild LVH, grade 1 DD, mild AI.  Repeat lower extremity arterial Doppler in December 2022 demonstrated 50 to 74% stenosis in the right posterior tibial artery, patent right common femoral, SFA and the popliteal artery.  Normal ABI bilaterally.  Carotid Doppler obtained in January 2023 showed 1 to 39% right ICA stenosis, no significant disease seen in the left carotid artery.  Patient was admitted for blurry vision in March 2023.  Symptom was concerning for temporal arteritis.  Neurology was consulted-he was treated with IV steroids.  Work-up however was negative for temporal arteritis.  Per neurology, there was concern of small brainstem infarct responsible for the patient's symptom.  He had improvement of diplopia.  He was discharged on aspirin and Plavix for 64-monthafter  which time patient will continue on full dose aspirin by itself.  Patient presents today for follow-up.  He denies any recent exertional chest pain or worsening dyspnea.  He has a history of small distal vessel disease on previous cath in 2017.  He has no clear anginal symptoms recently.  A nuclear stress test is unlikely to be helpful given small vessel disease.  I discussed the case with [name] patient is cleared to proceed with upcoming procedure.  He is a moderate risk patient given comorbidities such as age, deconditioning and residual CAD.  However the risk is not prohibitive for the upcoming surgery.  His aspirin was increased to 325 mg daily by neurology service in February, will defer to neurology service to decide whether or not he is able to hold aspirin.  Past Medical History:  Diagnosis Date   Aftercare following surgery of the circulatory system, NEC 12/04/2013   [place] dec[place]itus [place] 05/19/2016   Arrhythmia 05/20/2016   Arthritis    ARTHRITIS, RIGHT FOOT 06/26/2008   Qualifier: Diagnosis of  By: HAline [name], Christopher     Cardiomyopathy- EF NL 01/2013, now 30-35% echo 03/14/2012   Carotid artery occlusion    left s/p CEA   Cellulitis of left foot 05/07/2018   CHF (congestive heart failure) (HCC)    Critical [place] limb ischemia ([place] 02/28/2014   Critical limb ischemia    Diabetes mellitus    Elevated troponin 04/11/2016   GERD (gastroesophageal  reflux disease)    History of stroke June 2013 03/14/2012   HOH (hard of hearing)    Hypertension    Hypothyroidism    Nonhealing skin ulcer ([place]) 10/28/2014   Obstructive sleep apnea    Orthostatic hypotension    Osteomyelitis (HCC)    left great toe   Pain in the chest 05/17/2016   Peripheral arterial disease ([place]), s/p PTA x 2 RLE    nonhealing ulcers bilaterally on each great toe   Pneumonia    PONV (postoperative nausea and vomiting)    Also hard to awake after anesthesia   Pulmonary nodule 05/17/2016   Restless  leg syndrome    Shortness of breath    Stroke ([place]) 03/08/2012   Toe osteomyelitis, left (HCC)    Ulcer of great toe, left, with necrosis of bone ([place])    Wears dentures     Past Surgical History:  Procedure Laterality Date   AMPUTATION TOE Left 07/25/2018   Procedure: AMPUTATION TOE INTERPHALANGEAL HALLUX LEFT;  Surgeon: Evelina Bucy, DPM;  Location: [place];  Service: Podiatry;  Laterality: Left;   ANGIOPLASTY  02/28/14   diamond back orbital rotational atherectomy of Rt. tibial   BACK SURGERY     BONE BIOPSY Left 07/25/2018   Procedure: SUPERFICIAL BONE BIOPSY;  Surgeon: Evelina Bucy, DPM;  Location: [place];  Service: Podiatry;  Laterality: Left;   CARDIAC CATHETERIZATION N/A 05/19/2016   Procedure: Right/Left Heart Cath and Coronary Angiography;  Surgeon: Belva Crome, MD;  Location: [place] CV LAB;  Service: Cardiovascular;  Laterality: N/A;   CERVICAL FUSION     ENDARTERECTOMY Left 11/29/2013   Procedure: ENDARTERECTOMY CAROTID;  Surgeon: Serafina Mitchell, MD;  Location: [place];  Service: Vascular;  Laterality: Left;   ESOPHAGOGASTRODUODENOSCOPY  02/2010   Dr. Gala Romney: patient presented with food impaction, schatzki ring with superimposed component of stricture with erosive reflux esophagitis, s/p disimpaction but dilation planned at later date    ESOPHAGOGASTRODUODENOSCOPY (EGD) WITH PROPOFOL N/A 08/13/2019   Dr. Gala Romney: Erosive reflux esophagitis with mild stricture and incidental Mallory-Weiss tear which precluded esophageal dilation.  Medium sized hiatal hernia.   ESOPHAGOGASTRODUODENOSCOPY (EGD) WITH PROPOFOL N/A 10/11/2019   Dr. Gala Romney: Esophageal stenosis status post dilation, moderate hiatal hernia   EYE SURGERY     FLEXIBLE SIGMOIDOSCOPY N/A 08/13/2019   Procedure: FLEXIBLE SIGMOIDOSCOPY;  Surgeon: [name], MD;  [facility];  Service: Endoscopy;  Laterality: N/A;  colonoscopy aboerted due to formed stool and poor prep   FOOT SURGERY     KNEE  ARTHROSCOPY WITH MEDIAL MENISECTOMY Left 05/06/2020   Procedure: KNEE ARTHROSCOPY WITH MEDIAL MENISCECTOMY AND LATERAL MENISCECTOMY;  Surgeon: [name], MD;  Location: [facility];  Service: Orthopedics;  Laterality: Left;   Lower ext duplex doppler  03/14/14   Rt ABI 1.2   LOWER EXTREMITY ANGIOGRAM Bilateral 02/18/2014   Procedure: LOWER EXTREMITY ANGIOGRAM;  Surgeon: [name], MD;  Location[facility] CATH LAB;  Service: Cardiovascular;  Laterality: Bilateral;   LOWER EXTREMITY ANGIOGRAM N/A 10/31/2014   Procedure: LOWER EXTREMITY ANGIOGRAM;  Surgeon: [name], MD;  Location: [facility] CATH LAB;  Service: Cardiovascular;  Laterality: N/A;   MALONEY DILATION N/A 10/11/2019   Procedure: Venia Minks DILATION;  Surgeon: [name], MD;  [facility];  Service: Endoscopy;  Laterality: N/A;   MULTIPLE TOOTH EXTRACTIONS     PV angiogram  02/18/2014   tibial vessel diseas bil.   SPINE SURGERY  tendon achillies lengthing and sesamoid      Current Medications: Current Meds  Medication Sig   aspirin EC 325 MG EC tablet Take 1 tablet (325 mg total) by mouth daily.   b complex vitamins tablet Take 1 tablet by mouth daily.   carvedilol (COREG) 3.125 MG tablet Take 0.5 tablets (1.5625 mg total) by mouth 2 (two) times daily with a meal. (Patient taking differently: Take 3.125 mg by mouth 2 (two) times daily with a meal.)   Cholecalciferol (VITAMIN D3) 5000 units TABS Take 5,000 Units by mouth daily at 6 (six) AM.   clopidogrel (PLAVIX) 75 MG tablet TAKE ONE (1) TABLET BY MOUTH EVERY DAY (Patient taking differently: Take 75 mg by mouth daily.)   Continuous Blood Gluc Receiver (FREESTYLE LIBRE 14 DAY READER) DEVI    Continuous Blood Gluc Sensor (FREESTYLE LIBRE 14 DAY SENSOR) MISC APPLY ONE SENSOR ONTO SKIN EVERY 14 DAYS   Cyanocobalamin 1500 MCG TBDP Take 1,500 mcg by mouth daily.    doxycycline (VIBRAMYCIN) 100 MG capsule Take 1 capsule (100 mg total) by mouth 2 (two) times daily for 10  days.   furosemide (LASIX) 40 MG tablet TAKE ONE TABLET ('40MG'$  TOTAL) BY MOUTH TWO TIMES DAILY.   glimepiride (AMARYL) 4 MG tablet TAKE ONE TABLET ('4MG'$  TOTAL) BY MOUTH DAILY WITH BREAKFAST   levothyroxine (SYNTHROID) 88 MCG tablet Take 1 tablet (88 mcg total) by mouth daily.   magnesium 30 MG tablet Take 30 mg by mouth daily.   Multiple Vitamin (MULTIVITAMIN WITH MINERALS) TABS tablet Take 1 tablet by mouth daily.   pantoprazole (PROTONIX) 40 MG tablet TAKE ONE TABLET BY MOUTH ONCE DAILY (Patient taking differently: Take 40 mg by mouth daily.)   potassium chloride (KLOR-CON) 10 MEQ tablet TAKE ONE TABLET (10MEQ TOTAL) BY MOUTH DAILY (Patient taking differently: Take 10 mEq by mouth daily.)   rOPINIRole (REQUIP) 0.5 MG tablet Take 0.5 mg by mouth 3 (three) times daily.   rosuvastatin (CRESTOR) 20 MG tablet TAKE ONE TABLET ('20MG'$  TOTAL) BY MOUTH DAILY (Patient taking differently: Take 20 mg by mouth daily.)     Allergies:   Codeine and Tramadol   Social History   Socioeconomic History   Marital status: Married    Spouse name: [name]    Number of children: 2   Years of education: college   Highest education level: Not on file  Occupational History   Occupation: Firefighter   Tobacco Use   Smoking status: Former    Types: Pipe    Quit date: 07/08/1977    Years since quitting: 44.6   Smokeless tobacco: Never  Vaping Use   Vaping Use: Never used  Substance and Sexual Activity   Alcohol use: No    Alcohol/week: 0.0 standard drinks of alcohol   Drug use: No   Sexual activity: Not Currently  Other Topics Concern   Not on file  Social History Narrative   Retired from [employer] social research officer -[employer]      Lives with [name]    Cat: Jazz      Enjoy: sleeping a lot       Diet: eats all food groups -chicken, steak-chopped, mostly veggie   Caffeine: coffee and soda "a lot"   Water: 3-4 cups daily      Wears seat belt   Does not use phone while driving    Smoke detectors at home     weapons at home        Social Determinants of Health  Financial Resource Strain: Low Risk  (02/28/2020)   Overall Financial Resource Strain (CARDIA)    Difficulty of Paying Living Expenses: Not hard at all  Food Insecurity: No Food Insecurity (02/02/2021)   Hunger Vital Sign    Worried About Running Out of Food in the Last Year: Never true    [place] in the Last Year: Never true  Transportation Needs: No Transportation Needs (02/02/2021)   PRAPARE - Hydrologist (Medical): No    Lack of Transportation (Non-Medical): No  Physical Activity: Inactive (02/28/2020)   Exercise Vital Sign    Days of Exercise per Week: 0 days    Minutes of Exercise per Session: 0 min  Stress: No Stress Concern Present (02/28/2020)   [place]    Feeling of Stress : Only a little  Social Connections: Moderately Isolated (02/02/2021)   Social Connection and Isolation Panel [NHANES]    Frequency of Communication with Friends and Family: Three times a week    Frequency of Social Gatherings with Friends and Family: Three times a week    Attends Religious Services: Never    Active Member of Clubs or Organizations: No    Attends Archivist Meetings: Never    Marital Status: Married     Family History: The patient's family history includes Diabetes in his father and son; Heart attack in his mother; Heart disease in his mother and son; Hypertension in his father, mother, and son. There is no history of Colon cancer.  ROS:   Please see the history of present illness.     All other systems reviewed and are negative.  EKGs/Labs/Other Studies Reviewed:    The following studies were reviewed today:  Echo 02/16/2021 1. Left ventricular ejection fraction, by estimation, is 55 to 60%. The  left ventricle has normal function. The left ventricle has no regional  wall motion abnormalities. There is mild left  ventricular hypertrophy.  Left ventricular diastolic parameters  are consistent with Grade I diastolic dysfunction (impaired relaxation).   2. Right ventricular systolic function is normal. The right ventricular  size is normal.   3. Left atrial size was mildly dilated.   4. The mitral valve is normal in structure. No evidence of mitral valve  regurgitation. No evidence of mitral stenosis.   5. The aortic valve is tricuspid. Aortic valve regurgitation is mild. No  aortic stenosis is present.   6. The inferior vena cava is normal in size with greater than 50%  respiratory variability, suggesting right atrial pressure of 3 mmHg.   EKG:  EKG is ordered today.  The ekg ordered today demonstrates normal sinus rhythm, no significant ST changes.  Nonspecific T wave abnormality.  Recent Labs: 05/16/2021: B Natriuretic Peptide 174.0 11/17/2021: TSH 4.520 11/18/2021: Magnesium 2.2 02/24/2022: ALT 12; BUN 14; Creatinine, Ser 0.91; Hemoglobin 11.4; Platelets 171; Potassium 4.3; Sodium 139  Recent Lipid Panel    Component Value Date/Time   CHOL 159 10/06/2021 1037   TRIG 92 10/06/2021 1037   HDL 54 10/06/2021 1037   CHOLHDL 2.9 10/06/2021 1037   CHOLHDL 3.9 11/26/2014 0930   VLDL 25 11/26/2014 0930   LDLCALC 88 10/06/2021 1037     Risk Assessment/Calculations:           Physical Exam:    VS:  BP 132/66 (BP Location: Left Arm, Patient Position: Sitting, Cuff Size: Normal)   Pulse 69   Ht 6' (  1.829 m)   Wt 211 lb 3.2 oz (95.8 kg)   SpO2 96%   BMI 28.64 kg/m     Wt Readings from Last 3 Encounters:  03/04/22 204 lb 14.4 oz (92.9 kg)  03/04/22 205 lb 12.8 oz (93.4 kg)  03/02/22 211 lb 3.2 oz (95.8 kg)     GEN:  Well nourished, well developed in no acute distress HEENT: Normal NECK: No JVD; No carotid bruits LYMPHATICS: No lymphadenopathy CARDIAC: RRR, no murmurs, rubs, gallops RESPIRATORY:  Clear to auscultation without rales, wheezing or rhonchi  ABDOMEN: Soft, non-tender,  non-distended MUSCULOSKELETAL:  No edema; No deformity  SKIN: Warm and dry NEUROLOGIC:  Alert and oriented x 3 PSYCHIATRIC:  Normal affect   ASSESSMENT:    1. Preoperative clearance   2. Coronary artery disease involving native coronary artery of native heart without angina pectoris   3. Primary hypertension   4. Hyperlipidemia LDL goal <70   5. PAD (peripheral artery disease) (HCC)    PLAN:    In order of problems listed above:  Preoperative clearance: Patient has upcoming neck surgery by Dr. Ellene Route.  Previous cardiac catheterization in 2017 showed moderate to severe distal vessel disease.  He denies any recent chest pain.  I discussed his case with Christopher Dr. Harriet [name], patient is cleared to proceed with upcoming procedure.  He is at least a moderate risk patient given comorbidities.  He may stop Plavix for 7 days prior to the procedure.  He is on aspirin given the prior history of stroke.  We will defer high-dose aspirin management to neurology service  CAD: Previous cardiac catheterization in 2017 showed moderate to severe distal vessel disease.  Denies any recent chest pain  Hypertension: Blood pressure stable  Hyperlipidemia: On Crestor  PAD: No significant claudication symptoms.            Medication Adjustments/Labs and Tests Ordered: Current medicines are reviewed at length with the patient today.  Concerns regarding medicines are outlined above.  Orders Placed This Encounter  Procedures   EKG 12-Lead   Meds ordered this encounter  Medications   nitroGLYCERIN (NITROSTAT) 0.4 MG SL tablet    Sig: Place 1 tablet (0.4 mg total) under the tongue every 5 (five) minutes as needed for chest pain.    Dispense:  25 tablet    Refill:  3    Patient Instructions  Medication Instructions:  HOLD Plavix for 7 days prior.  Check with Neurology office to ask about holding Asprin   *If you need a refill on your cardiac medications before your next appointment, please call your  pharmacy*  Lab Work: NONE ordered at this time of appointment   If you have labs (blood work) drawn today and your tests are completely normal, you will receive your results only by: [place] (if you have MyChart) OR A paper copy in the mail If you have any lab test that is abnormal or we need to change your treatment, we will call you to review the results.  Testing/Procedures: NONE ordered at this time of appointment   Follow-Up: At [facility], you and your health needs are our priority.  As part of our continuing mission to provide you with exceptional heart care, we have created designated Provider Care Teams.  These Care Teams include your primary Cardiologist (physician) and Advanced Practice Providers (APPs -  Physician Assistants and Nurse Practitioners) who all work together to provide you with the care you need, when you need it.  Your next appointment:   6 month(s)  The format for your next appointment:   In Person  Provider:   Quay Burow, MD     Other Instructions   Important Information About Sugar         Signed, Almyra Deforest, [place]  03/04/2022 11:30 PM    [place]

## 2022-03-02 NOTE — Progress Notes (Signed)
SITUATION Reason for Visit: Fitting of Diabetic Shoes & Insoles Patient / Caregiver Report:  Patient is satisfied with fit and function of shoes and insoles.  OBJECTIVE DATA: Patient History / Diagnosis:     ICD-10-CM   1. Type 2 diabetes mellitus with other circulatory complication, without long-term current use of insulin (HCC)  E11.59     2. Status post amputation of left great toe (HCC)  Z89.412       Change in Status:   None  ACTIONS PERFORMED: In-Person Delivery, patient was fit with: - 1x pair [id number] PDAC approved prefabricated Diabetic Shoes: [employer] [address] 14XW - 3x pair [id number] PDAC approved vacuum formed custom diabetic insoles; [employer]: [id number]  Shoes and insoles were verified for structural integrity and safety. Patient wore shoes and insoles in office. Skin was inspected and free of areas of concern after wearing shoes and inserts. Shoes and inserts fit properly. Patient / Caregiver provided with ferbal instruction and demonstration regarding donning, doffing, wear, care, proper fit, function, purpose, cleaning, and use of shoes and insoles ' and in all related precautions and risks and benefits regarding shoes and insoles. Patient / Caregiver was instructed to wear properly fitting socks with shoes at all times. Patient was also provided with verbal instruction regarding how to report any failures or malfunctions of shoes or inserts, and necessary follow up care. Patient / Caregiver was also instructed to contact physician regarding change in status that may affect function of shoes and inserts.   Patient / Caregiver verbalized undersatnding of instruction provided. Patient / Caregiver demonstrated independence with proper donning and doffing of shoes and inserts.  PLAN Patient to follow with treating physician as recommended. Plan of care was discussed with and agreed upon by patient and/or caregiver. All questions were answered and concerns addressed.

## 2022-03-03 ENCOUNTER — Ambulatory Visit (HOSPITAL_COMMUNITY): Payer: HMO

## 2022-03-03 ENCOUNTER — Ambulatory Visit: Payer: HMO | Admitting: Internal Medicine

## 2022-03-03 ENCOUNTER — Telehealth: Payer: Self-pay

## 2022-03-03 NOTE — Telephone Encounter (Signed)
Called patient on home phone, no answer, left detailed message on answering machine with Dr. Heber Palmas comments. Also reminded patient of upcoming appointment with infectious diease.   Dr. Heber Tampico comments: Please let [name] know I have forwarded lab results to his PCP. His glucose was elevated, but I don't think he was fasting. CRP and Sed rate were both normal which is good. I would still like him to have the MRI and Infectious Disease consult done since he has had more than one breakdown on this foot.    Thanks and have a nice day ladies! Dr. Darnell Level.

## 2022-03-03 NOTE — Progress Notes (Signed)
Surgical Instructions    Your procedure is scheduled on Thursday June 22nd .  Report to [facility] Main Entrance "A" at 9 A.M., then check in with the Admitting office.  Call this number if you have problems the morning of surgery:  [phone]   If you have any questions prior to your surgery date call [phone]: Open Monday-Friday 8am-4pm    Remember:  Do not eat after midnight the night before your surgery  You may drink clear liquids until 8 the morning of your surgery.   Clear liquids allowed are: Water, Non-Citrus Juices (without pulp), Carbonated Beverages, Clear Tea, Black Coffee ONLY (NO MILK, CREAM OR POWDERED CREAMER of any kind), and Gatorade    Take these medicines the morning of surgery with A SIP OF WATER: carvedilol (COREG) 3.125 MG tablet doxycycline (VIBRAMYCIN) 100 MG capsule levothyroxine (SYNTHROID) 88 MCG tablet pantoprazole (PROTONIX) 40 MG tablet rOPINIRole (REQUIP) 0.5 MG tablet  IF NEEDED  nitroGLYCERIN (NITROSTAT) 0.4 MG SL tablet    Follow your surgeon's instructions on when to stop Aspirin and Plavix.  If no instructions were given by your surgeon then you will need to call the office to get those instructions.     As of today, STOP taking any Aspirin (unless otherwise instructed by your surgeon) Aleve, Naproxen, Ibuprofen, Motrin, Advil, Goody's, BC's, all herbal medications, fish oil, and all vitamins.  WHAT DO I DO ABOUT MY DIABETES MEDICATION?   Do not take oral diabetes medicines (glimepiride, ) the morning of surgery.  You may take your morning dose of glimepiride the day before surgery (21st) but not an evening dose on the 21st   The day of surgery, do not take other diabetes injectables, including Byetta (exenatide), Bydureon (exenatide ER), Victoza (liraglutide), or Trulicity (dulaglutide).  If your CBG is greater than 220 mg/dL, you may take  of your sliding scale (correction) dose of insulin.   HOW TO MANAGE YOUR  DIABETES BEFORE AND AFTER SURGERY  Why is it important to control my blood sugar before and after surgery? Improving blood sugar levels before and after surgery helps healing and can limit problems. A way of improving blood sugar control is eating a healthy diet by:  Eating less sugar and carbohydrates  Increasing activity/exercise  Talking with your doctor about reaching your blood sugar goals High blood sugars (greater than 180 mg/dL) can raise your risk of infections and slow your recovery, so you will need to focus on controlling your diabetes during the weeks before surgery. Make sure that the doctor who takes care of your diabetes knows about your planned surgery including the date and location.  How do I manage my blood sugar before surgery? Check your blood sugar at least 4 times a day, starting 2 days before surgery, to make sure that the level is not too high or low.  Check your blood sugar the morning of your surgery when you wake up and every 2 hours until you get to the Short Stay unit.  If your blood sugar is less than 70 mg/dL, you will need to treat for low blood sugar: Do not take insulin. Treat a low blood sugar (less than 70 mg/dL) with  cup of clear juice (cranberry or apple), 4 glucose tablets, OR glucose gel. Recheck blood sugar in 15 minutes after treatment (to make sure it is greater than 70 mg/dL). If your blood sugar is not greater than 70 mg/dL on recheck, call [phone] for further instructions. Report your blood sugar  to the short stay nurse when you get to Short Stay.  If you are admitted to the hospital after surgery: Your blood sugar will be checked by the staff and you will probably be given insulin after surgery (instead of oral diabetes medicines) to make sure you have good blood sugar levels. The goal for blood sugar control after surgery is 80-180 mg/dL.    Do not wear jewelry  Do not wear lotions, powders, colognes, or deodorant. Do not shave 48  hours prior to surgery.  Men may shave face and neck. Do not bring valuables to the hospital. Do not wear nail polish, gel polish, artificial nails, or any other type of covering on natural nails (fingers and toes) If you have artificial nails or gel coating that need to be removed by a nail salon, please have this removed prior to surgery. Artificial nails or gel coating may interfere with anesthesia's ability to adequately monitor your vital signs.  [facility] is not responsible for any belongings or valuables. .   Do NOT Smoke (Tobacco/Vaping)  24 hours prior to your procedure  If you use a CPAP at night, you may bring your mask for your overnight stay.   Contacts, glasses, hearing aids, dentures or partials may not be worn into surgery, please bring cases for these belongings   For patients admitted to the hospital, discharge time will be determined by your treatment team.   Patients discharged the day of surgery will not be allowed to drive home, and someone needs to stay with them for 24 hours.   SURGICAL WAITING ROOM VISITATION Patients having surgery or a procedure in a hospital may have two support people. Children under the age of 59 must have an adult with them who is not the patient. They may stay in the waiting area during the procedure and may switch out with other visitors. If the patient needs to stay at the hospital during part of their recovery, the visitor guidelines for inpatient rooms apply.  Please refer to the [facility] website for the visitor guidelines for Inpatients (after your surgery is over and you are in a regular room).       Special instructions:    Oral Hygiene is also important to reduce your risk of infection.  Remember - BRUSH YOUR TEETH THE MORNING OF SURGERY WITH YOUR REGULAR TOOTHPASTE   [facility]- Preparing For Surgery  Before surgery, you can play an important role. Because skin is not sterile, your skin needs to be as free of germs as  possible. You can reduce the number of germs on your skin by washing with CHG (chlorahexidine gluconate) Soap before surgery.  CHG is an antiseptic cleaner which kills germs and bonds with the skin to continue killing germs even after washing.     Please do not use if you have an allergy to CHG or antibacterial soaps. If your skin becomes reddened/irritated stop using the CHG.  Do not shave (including legs and underarms) for at least 48 hours prior to first CHG shower. It is OK to shave your face.  Please follow these instructions carefully.     Shower the NIGHT BEFORE SURGERY and the MORNING OF SURGERY with CHG Soap.   If you chose to wash your hair, wash your hair first as usual with your normal shampoo. After you shampoo, rinse your hair and body thoroughly to remove the shampoo.  Then [employer] and genitals (private parts) with your normal soap and rinse thoroughly  to remove soap.  After that Use CHG Soap as you would any other liquid soap. You can apply CHG directly to the skin and wash gently with a scrungie or a clean washcloth.   Apply the CHG Soap to your body ONLY FROM THE NECK DOWN.  Do not use on open wounds or open sores. Avoid contact with your eyes, ears, mouth and genitals (private parts). Wash Face and genitals (private parts)  with your normal soap.   Wash thoroughly, paying special attention to the area where your surgery will be performed.  Thoroughly rinse your body with warm water from the neck down.  DO NOT shower/wash with your normal soap after using and rinsing off the CHG Soap.  Pat yourself dry with a CLEAN TOWEL.  Wear CLEAN PAJAMAS to bed the night before surgery  Place CLEAN SHEETS on your bed the night before your surgery  DO NOT SLEEP WITH PETS.   Day of Surgery:  Take a shower with CHG soap. Wear Clean/Comfortable clothing the morning of surgery Do not apply any deodorants/lotions.   Remember to brush your teeth WITH YOUR REGULAR  TOOTHPASTE.    If you received a COVID test during your pre-op visit, it is requested that you wear a mask when out in public, stay away from anyone that may not be feeling well, and notify your surgeon if you develop symptoms. If you have been in contact with anyone that has tested positive in the last 10 days, please notify your surgeon.    Please read over the following fact sheets that you were given.

## 2022-03-03 NOTE — Telephone Encounter (Signed)
-----   Message from Marzetta Board, DPM sent at 02/25/2022  8:03 AM EDT ----- [name] for Dr. Blenda Mounts.   [name] is out today. Please let [name] know I have forwarded lab results to his PCP. His glucose was elevated, but I don't think he was fasting. CRP and Sed rate were both normal which is good. I would still like him to have the MRI and Infectious Disease consult done since he has had more than one breakdown on this foot.   Thanks and have a nice day ladies! Dr. Darnell Level.

## 2022-03-04 ENCOUNTER — Ambulatory Visit (INDEPENDENT_AMBULATORY_CARE_PROVIDER_SITE_OTHER): Payer: HMO | Admitting: Internal Medicine

## 2022-03-04 ENCOUNTER — Encounter: Payer: Self-pay | Admitting: Internal Medicine

## 2022-03-04 ENCOUNTER — Other Ambulatory Visit: Payer: Self-pay

## 2022-03-04 ENCOUNTER — Encounter (HOSPITAL_COMMUNITY)
Admission: RE | Admit: 2022-03-04 | Discharge: 2022-03-04 | Disposition: A | Discharge status: Home / Self Care | Payer: HMO | Source: Ambulatory Visit | Attending: Neurological Surgery | Admitting: Neurological Surgery

## 2022-03-04 ENCOUNTER — Encounter (HOSPITAL_COMMUNITY): Payer: Self-pay

## 2022-03-04 ENCOUNTER — Ambulatory Visit: Payer: HMO | Admitting: Internal Medicine

## 2022-03-04 ENCOUNTER — Encounter: Payer: Self-pay | Admitting: Physician Assistant

## 2022-03-04 VITALS — BP 147/71 | HR 77 | Temp 97.7°F | Resp 17 | Ht 72.0 in | Wt 204.9 lb

## 2022-03-04 DIAGNOSIS — I429 Cardiomyopathy, unspecified: Secondary | ICD-10-CM | POA: Insufficient documentation

## 2022-03-04 DIAGNOSIS — Z79899 Other long term (current) drug therapy: Secondary | ICD-10-CM | POA: Insufficient documentation

## 2022-03-04 DIAGNOSIS — Z8673 Personal history of transient ischemic attack (TIA), and cerebral infarction without residual deficits: Secondary | ICD-10-CM | POA: Diagnosis not present

## 2022-03-04 DIAGNOSIS — Z01812 Encounter for preprocedural laboratory examination: Secondary | ICD-10-CM | POA: Insufficient documentation

## 2022-03-04 DIAGNOSIS — E785 Hyperlipidemia, unspecified: Secondary | ICD-10-CM | POA: Insufficient documentation

## 2022-03-04 DIAGNOSIS — E08621 Diabetes mellitus due to underlying condition with foot ulcer: Secondary | ICD-10-CM

## 2022-03-04 DIAGNOSIS — I739 Peripheral vascular disease, unspecified: Secondary | ICD-10-CM | POA: Diagnosis not present

## 2022-03-04 DIAGNOSIS — L97411 Non-pressure chronic ulcer of right heel and midfoot limited to breakdown of skin: Secondary | ICD-10-CM

## 2022-03-04 DIAGNOSIS — E1159 Type 2 diabetes mellitus with other circulatory complications: Secondary | ICD-10-CM | POA: Diagnosis not present

## 2022-03-04 DIAGNOSIS — I351 Nonrheumatic aortic (valve) insufficiency: Secondary | ICD-10-CM | POA: Insufficient documentation

## 2022-03-04 DIAGNOSIS — D649 Anemia, unspecified: Secondary | ICD-10-CM | POA: Diagnosis not present

## 2022-03-04 DIAGNOSIS — G4733 Obstructive sleep apnea (adult) (pediatric): Secondary | ICD-10-CM | POA: Diagnosis not present

## 2022-03-04 DIAGNOSIS — I2510 Atherosclerotic heart disease of native coronary artery without angina pectoris: Secondary | ICD-10-CM | POA: Diagnosis not present

## 2022-03-04 DIAGNOSIS — I110 Hypertensive heart disease with heart failure: Secondary | ICD-10-CM | POA: Diagnosis not present

## 2022-03-04 DIAGNOSIS — I509 Heart failure, unspecified: Secondary | ICD-10-CM | POA: Insufficient documentation

## 2022-03-04 DIAGNOSIS — Z01818 Encounter for other preprocedural examination: Secondary | ICD-10-CM

## 2022-03-04 LAB — SURGICAL PCR SCREEN
MRSA, PCR: NEGATIVE
Staphylococcus aureus: NEGATIVE

## 2022-03-04 LAB — GLUCOSE, CAPILLARY: Glucose-Capillary: 134 mg/dL — ABNORMAL HIGH (ref 70–99)

## 2022-03-04 LAB — HEMOGLOBIN A1C
Hgb A1c MFr Bld: 6.1 % — ABNORMAL HIGH (ref 4.8–5.6)
Mean Plasma Glucose: 128.37 mg/dL

## 2022-03-04 NOTE — Progress Notes (Signed)
[facility] for Infectious Disease  Reason for Consult: Right foot diabetic foot ulcer Referring Provider: Dr. Acquanetta Sit  Assessment: He has a chronic, recurrent plantar ulcer on his right foot.  It appears that he probably had some superficial infection when seen on 02/23/2022.  His ulcer is looking much better and so far there is no evidence of deep infection.  He will complete his course of doxycycline and I will arrange phone follow-up in 2 weeks to review MRI findings.   Plan: Follow-up in 2 weeks Await MRI results  Patient Active Problem List   Diagnosis Date Noted   Diabetic foot ulcer ([place]) 09/06/2018    Priority: High   Cervical myelopathy ([place]) 02/23/2022   Hospital discharge follow-up 11/26/2021   Hyponatremia 11/26/2021   Prolonged QT interval 11/16/2021   GERD (gastroesophageal reflux disease) 11/16/2021   Blurred vision 11/16/2021   Hypothyroidism 10/05/2021   Hypokalemia 05/18/2021   Muscular deconditioning 05/12/2021   Frequent falls 05/12/2021   Fatigue 04/29/2021   Recent unexplained weight loss 04/23/2021   Left shoulder pain 04/23/2021   Chest tightness 04/23/2021   Deep tissue injury 04/23/2021   Myoclonus 04/15/2021   Pneumonia 04/03/2021   PNA (pneumonia) 04/02/2021   Abnormal finding on urinalysis 04/01/2021   Syncope 02/16/2021   Minor head injury    Right hip pain 02/11/2021   Erectile dysfunction 01/22/2021   Gait abnormality 09/30/2020   Mild non proliferative diabetic retinopathy ([place]) 07/16/2020   Need for immunization against influenza 06/03/2020   S/P left knee arthroscopy 05/06/20 05/13/2020   Abnormal MRI 03/26/2020   Joint disorder of knee 03/26/2020   Fall at home, initial encounter 03/13/2020   Anemia, deficiency 09/03/2019   Reflux esophagitis 09/03/2019   Esophageal dysphagia 05/30/2019   Constipation 05/30/2019   Positive colorectal cancer screening using Cologuard test 05/30/2019   Orthostasis 08/25/2016    Congestive heart failure ([place]) 05/17/2016   Bilateral carotid artery disease ([place]) 07/08/2014   Obstructive sleep apnea 02/12/2014   Restless legs 10/23/2013   Overweight with body mass index (BMI) of 29 to 29.9 in adult 10/23/2013   Hyperlipidemia 07/16/2013   Peripheral arterial disease ([place]) 07/16/2013   Hypertension 03/14/2012   Type 2 diabetes mellitus with circulatory disorder ([place]) 03/14/2012   Peripheral neuropathy 03/14/2012   Cardiomyopathy- EF NL 01/2013, now 30-35% echo 03/14/2012    Patient's Medications  New Prescriptions   No medications on file  Previous Medications   ASPIRIN EC 325 MG EC TABLET    Take 1 tablet (325 mg total) by mouth daily.   B COMPLEX VITAMINS TABLET    Take 1 tablet by mouth daily.   CARVEDILOL (COREG) 3.125 MG TABLET    Take 0.5 tablets (1.5625 mg total) by mouth 2 (two) times daily with a meal.   CHOLECALCIFEROL (VITAMIN D3) 5000 UNITS TABS    Take 5,000 Units by mouth daily at 6 (six) AM.   CLOPIDOGREL (PLAVIX) 75 MG TABLET    TAKE ONE (1) TABLET BY MOUTH EVERY DAY   CONTINUOUS BLOOD GLUC RECEIVER (FREESTYLE LIBRE 14 DAY READER) DEVI       CONTINUOUS BLOOD GLUC SENSOR (FREESTYLE LIBRE 14 DAY SENSOR) MISC    APPLY ONE SENSOR ONTO SKIN EVERY 14 DAYS   CYANOCOBALAMIN 1500 MCG TBDP    Take 1,500 mcg by mouth daily.    DOXYCYCLINE (VIBRAMYCIN) 100 MG CAPSULE    Take 1 capsule (100 mg total) by mouth 2 (two)  times daily for 10 days.   FUROSEMIDE (LASIX) 40 MG TABLET    TAKE ONE TABLET ('40MG'$  TOTAL) BY MOUTH TWO TIMES DAILY.   GABAPENTIN (NEURONTIN) 100 MG CAPSULE    Take 100-200 mg by mouth at bedtime.   GLIMEPIRIDE (AMARYL) 4 MG TABLET    TAKE ONE TABLET ('4MG'$  TOTAL) BY MOUTH DAILY WITH BREAKFAST   LEVOTHYROXINE (SYNTHROID) 88 MCG TABLET    Take 1 tablet (88 mcg total) by mouth daily.   MAGNESIUM 30 MG TABLET    Take 30 mg by mouth daily.   MULTIPLE VITAMIN (MULTIVITAMIN WITH MINERALS) TABS TABLET    Take 1 tablet by mouth daily.   NITROGLYCERIN  (NITROSTAT) 0.4 MG SL TABLET    Place 1 tablet (0.4 mg total) under the tongue every 5 (five) minutes as needed for chest pain.   PANTOPRAZOLE (PROTONIX) 40 MG TABLET    TAKE ONE TABLET BY MOUTH ONCE DAILY   POTASSIUM CHLORIDE (KLOR-CON) 10 MEQ TABLET    TAKE ONE TABLET (10MEQ TOTAL) BY MOUTH DAILY   ROPINIROLE (REQUIP) 0.5 MG TABLET    Take 0.5 mg by mouth 3 (three) times daily.   ROSUVASTATIN (CRESTOR) 20 MG TABLET    TAKE ONE TABLET ('20MG'$  TOTAL) BY MOUTH DAILY   UNABLE TO FIND    Home health supplies for CPAP   UNABLE TO FIND    Butler sock aid to help with support hose. Size XL.  Dx:I73.9, G25.81, R60.9  Modified Medications   No medications on file  Discontinued Medications   No medications on file    HPI: [name] is a 76 y.o. male with diabetes.  Many years ago he underwent surgery on his right foot with first metacarpal phalangeal fusion.  He tells me that he has struggled with recurrent right plantar foot ulcers ever since that time.  He has been followed by podiatry and the wound center.  He had recurrent surgery in 2017 with sesamoidectomy to try to change pressure points.  When he was seen by Dr. Elisha Ponder on 02/23/2022 there was some devitalized tissue in the wound and some wound drainage.  Plain x-rays did not reveal any evidence of bony erosion.  Sed rate and C-reactive protein were both normal.  Gram stain of drainage showed gram-negative rods, gram-positive rods and gram-positive cocci.  Cultures grew only normal skin flora.  He was started on oral doxycycline.  An MRI scan was ordered.  He has not had any fever, swelling of his foot unusual redness or further drainage that he is aware of.  His last hemoglobin A1c was 6.6 in January.  Review of Systems: Review of Systems  Constitutional:  Negative for chills, diaphoresis and fever.  Musculoskeletal:  Positive for falls. Negative for joint pain.      Past Medical History:  Diagnosis Date   Aftercare following surgery of  the circulatory system, NEC 12/04/2013   Angina decubitus ([place]) 05/19/2016   Arrhythmia 05/20/2016   Arthritis    ARTHRITIS, RIGHT FOOT 06/26/2008   Qualifier: Diagnosis of  By: Aline Brochure MD, [name]     Cardiomyopathy- EF NL 01/2013, now 30-35% echo 03/14/2012   Carotid artery occlusion    left s/p CEA   Cellulitis of left foot 05/07/2018   CHF (congestive heart failure) (HCC)    Critical lower limb ischemia ([place]) 02/28/2014   Critical limb ischemia    Diabetes mellitus    Elevated troponin 04/11/2016   GERD (gastroesophageal reflux disease)    History  of stroke June 2013 03/14/2012   HOH (hard of hearing)    Hypertension    Hypothyroidism    Nonhealing skin ulcer ([place]) 10/28/2014   Obstructive sleep apnea    Orthostatic hypotension    Osteomyelitis (HCC)    left great toe   Pain in the chest 05/17/2016   Peripheral arterial disease ([place]), s/p PTA x 2 RLE    nonhealing ulcers bilaterally on each great toe   Pneumonia    PONV (postoperative nausea and vomiting)    Pulmonary nodule 05/17/2016   Restless leg syndrome    Shortness of breath    Stroke [facility]) March 08, 2012   Toe osteomyelitis, left [facility])    Ulcer of great toe, left, with necrosis of bone ([place])    Wears dentures     Social History   Tobacco Use   Smoking status: Former    Types: Pipe    Quit date: 07/08/1977    Years since quitting: 44.6   Smokeless tobacco: Never  Vaping Use   Vaping Use: Never used  Substance Use Topics   Alcohol use: No    Alcohol/week: 0.0 standard drinks of alcohol   Drug use: No    Family History  Problem Relation Age of Onset   Heart disease Mother    Hypertension Mother    Heart attack Mother    Hypertension Father    Diabetes Father    Diabetes Son    Heart disease Son    Hypertension Son    Colon cancer Neg Hx    Allergies  Allergen Reactions   Codeine Nausea And Vomiting   Tramadol Nausea Only    OBJECTIVE: Vitals:   03/04/22 1008  BP: 104/66  Pulse: 67  Resp: 16   SpO2: 97%  Weight: 205 lb 12.8 oz (93.4 kg)  Height: 6' (1.829 m)   Body mass index is 27.91 kg/m.   Physical Exam Constitutional:      Comments: He is very pleasant.  Neurological:     Gait: Gait abnormal.     Comments: He walks with the aid of a cane here but uses a walker at home.  His gait is slow and unsteady.  Psychiatric:        Mood and Affect: Mood normal.    Photo taken 02/23/2022   Photo taken today  Microbiology: Recent Results (from the past 240 hour(s))  WOUND CULTURE     Status: None   Collection Time: 02/23/22  4:36 PM   Specimen: Foot, Right; Wound  Result Value Ref Range Status   MICRO NUMBER: [id number]  Final   SPECIMEN QUALITY: Adequate  Final   SOURCE: NOT GIVEN  Final   STATUS: FINAL  Final   GRAM STAIN:   Final    Few epithelial cells No white blood cells seen Moderate Gram negative bacilli Few Gram positive bacilli Few Gram positive cocci in clusters   RESULT:   Final    Growth of skin flora (note: Growth does not include S. aureus, beta-hemolytic Streptococci or P. aeruginosa).   COMMENT:   Final    No source was provided. The specimen was tested and reported based upon the test code ordered. If this is incorrect, please contact client services.    Michel Bickers, MD [facility] [phone] pager   [phone] cell 03/04/2022, 10:20 AM

## 2022-03-04 NOTE — Assessment & Plan Note (Signed)
He has a chronic, recurrent plantar ulcer on his right foot.  It appears that he probably had some superficial infection when seen on 02/23/2022.  His ulcer is looking much better and so far there is no evidence of deep infection.  He will complete his course of doxycycline and I will arrange phone follow-up in 2 weeks to review MRI findings.

## 2022-03-04 NOTE — Progress Notes (Signed)
PCP - Vena Rua, FNP Cardiologist - Dr. Kennon Holter office. Received clearance with Almyra Deforest, PA  PPM/ICD - Denies Device Orders -  Rep Notified -   Chest x-ray - NI EKG - 03/02/22 Stress Test - Denies ECHO - 02/16/21 Cardiac Cath - 05/19/16  Sleep Study - Yes has OSA CPAP - Needs Dr. Edythe Clarity reissue. Has lost servicing company.  DM - Type II Fasting Blood Sugar - 96-98 CBG at PAT appt 134 Checks Blood Sugar __multiple___ times a day has Libre  Blood Thinner Instructions:Plavix  per patient instructed to stop aspirin and plavix on 03/03/22. Last dose 03/02/22    Anesthesia review: Yes cardiac history   Patient denies shortness of breath, fever, cough and chest pain at PAT appointment   All instructions explained to the patient, with a verbal understanding of the material. Patient agrees to go over the instructions while at home for a better understanding.  The opportunity to ask questions was provided.

## 2022-03-05 ENCOUNTER — Ambulatory Visit (HOSPITAL_COMMUNITY): Payer: HMO | Attending: Nurse Practitioner | Admitting: Physical Therapy

## 2022-03-05 ENCOUNTER — Ambulatory Visit (HOSPITAL_COMMUNITY): Payer: HMO

## 2022-03-05 ENCOUNTER — Encounter (HOSPITAL_COMMUNITY): Payer: Self-pay

## 2022-03-05 DIAGNOSIS — R262 Difficulty in walking, not elsewhere classified: Secondary | ICD-10-CM | POA: Diagnosis not present

## 2022-03-05 DIAGNOSIS — M79671 Pain in right foot: Secondary | ICD-10-CM | POA: Diagnosis present

## 2022-03-05 DIAGNOSIS — L97419 Non-pressure chronic ulcer of right heel and midfoot with unspecified severity: Secondary | ICD-10-CM | POA: Insufficient documentation

## 2022-03-05 DIAGNOSIS — E11621 Type 2 diabetes mellitus with foot ulcer: Secondary | ICD-10-CM | POA: Insufficient documentation

## 2022-03-05 DIAGNOSIS — E08621 Diabetes mellitus due to underlying condition with foot ulcer: Secondary | ICD-10-CM

## 2022-03-05 NOTE — Anesthesia Preprocedure Evaluation (Addendum)
Anesthesia Evaluation  Patient identified by MRN, date of birth, ID band Patient awake    Reviewed: Allergy & Precautions, NPO status , Patient's Chart, lab work & pertinent test results, reviewed documented beta blocker date and time   History of Anesthesia Complications (+) PONV and history of anesthetic complications  Airway Mallampati: II  TM Distance: >3 FB Neck ROM: Limited    Dental  (+) Dental Advisory Given, Edentulous Upper, Edentulous Lower   Pulmonary neg pulmonary ROS, former smoker,    Pulmonary exam normal        Cardiovascular hypertension, Pt. on home beta blockers and Pt. on medications + Peripheral Vascular Disease and +CHF  Normal cardiovascular exam  TTE 01/2021: EF 55-60%, mild LVH, grade I DD, mild LAE, mild AR   Neuro/Psych Cervical myelopathy, RLS CVA (2013)    GI/Hepatic Neg liver ROS, GERD  Medicated and Controlled,  Endo/Other  diabetes, Type 2, Oral Hypoglycemic AgentsHypothyroidism   Renal/GU negative Renal ROS  negative genitourinary   Musculoskeletal  (+) Arthritis ,   Abdominal   Peds  Hematology  (+) Blood dyscrasia (Hgb 11.4), anemia ,   Anesthesia Other Findings Day of surgery medications reviewed with patient.  Reproductive/Obstetrics negative OB ROS                          Anesthesia Physical Anesthesia Plan  ASA: 3  Anesthesia Plan: General   Post-op Pain Management: Tylenol PO (pre-op)*   Induction: Intravenous  PONV Risk Score and Plan: 3 and Treatment may vary due to age or medical condition, Ondansetron, Dexamethasone and Propofol infusion  Airway Management Planned: Oral ETT and Video Laryngoscope Planned  Additional Equipment:   Intra-op Plan:   Post-operative Plan: Extubation in OR  Informed Consent: I have reviewed the patients History and Physical, chart, labs and discussed the procedure including the risks, benefits and  alternatives for the proposed anesthesia with the patient or authorized representative who has indicated his/her understanding and acceptance.     Dental advisory given  Plan Discussed with:   Anesthesia Plan Comments: (PAT note by Karoline Prest, PA-C: [place] for history of PAD (s/p s/p orbital rotational atherectomy of severe tibial disease), HTN, HLD, OSA, CAD, cardiomyopathy. He was admitted with congestive heart failure in August 2017 and underwent diuresis. EF was 30 to 35% at the time. He underwent left and right heart cath by Dr. Tamala Julian that showed elevated LVEDP and moderate diffuse CAD. Medical therapy was recommended. Last echocardiogram obtained on 02/16/2021 showed EF 55 to 60%, mild LVH, grade 1 DD,mild AI. Repeat lower extremity arterial Doppler in December 2022 demonstrated 50 to 74% stenosis in the right posterior tibial artery, patentright common femoral, SFA and the popliteal artery. Normal ABI bilaterally. Carotid Doppler obtained in January 2023 showed 1 to 39% right ICA stenosis, no significant disease seen in the left carotid artery. Patient was admitted for blurry vision in March 2023. Symptom was concerning for temporal arteritis. Neurology was consulted-he was treated with IV steroids. Work-up however was negative for temporal arteritis. Per neurology, there was concern of small brainstem infarct responsible for the patient's symptom. He had improvement of diplopia. He was discharged on aspirin and Plavix for 22-monthafter which time patient will continue on full dose aspirin by itself.  Patient last seen by HAlmyra Deforest PA-C 03/02/2022 for preop evaluation.  Per note, "Preoperative clearance: Patient has upcoming neck surgery by Dr. EEllene Route Previous cardiac catheterization in 2017 showed  moderate to severe distal vessel disease. He denies any recent chest pain. I discussed his case with DOD Dr. Harriet Masson, patient is cleared to proceed with upcoming procedure. He  is at least a moderate risk patient given comorbidities. He may stop Plavix for 7 days prior to the procedure. He is on aspirin given the prior history of stroke. We will defer high-dose aspirin management to neurology service."  Patient reports last dose aspirin and Plavix 03/02/2022.  History of left CEA 2015, last carotid duplex 09/23/2020 showed 1 to 39% right ICA and patent left CEA site.  History of OSA, not currently on CPAP.  Non-insulin-dependent DM2, well controlled, A1c 6.1 on preop labs.  CMP and CBC from 02/24/2022 reviewed, mild anemia with hemoglobin 11.4, otherwise unremarkable.  TTE 02/16/2021: 1. Left ventricular ejection fraction, by estimation, is 55 to 60%. The  left ventricle has normal function. The left ventricle has no regional  wall motion abnormalities. There is mild left ventricular hypertrophy.  Left ventricular diastolic parameters  are consistent with Grade I diastolic dysfunction (impaired relaxation).  2. Right ventricular systolic function is normal. The right ventricular  size is normal.  3. Left atrial size was mildly dilated.  4. The mitral valve is normal in structure. No evidence of mitral valve  regurgitation. No evidence of mitral stenosis.  5. The aortic valve is tricuspid. Aortic valve regurgitation is mild. No  aortic stenosis is present.  6. The inferior vena cava is normal in size with greater than 50%  respiratory variability, suggesting right atrial pressure of 3 mmHg.   Carotid duplex 09/23/2020: Summary:  Right Carotid: Velocities in the right ICA are consistent with a 1-39% stenosis.  Left Carotid: The carotid endarterectomy site was well visualize demonstrating normal patency with no evidence of significant diameter reduction.  Vertebrals: Bilateral vertebral arteries demonstrate antegrade flow.  Subclavians: Normal flow hemodynamics were seen in bilateral subclavian arteries.   Right/left cath 05/19/2016: . Mid RCA to Dist RCA  lesion, 50 %stenosed. . Dist RCA lesion, 45 %stenosed. [name] RPDA lesion, 50 %stenosed. . Dist LAD-2 lesion, 50 %stenosed. . Dist LAD-1 lesion, 80 %stenosed. . Mid LAD lesion, 50 %stenosed. . Prox LAD lesion, 40 %stenosed. . 1st Mrg lesion, 85 %stenosed. . Hemodynamic findings consistent with moderate pulmonary hypertension.  . Diffuse moderate to severe LAD disease. The most severe region is in the mid to distal vessel and is somewhat eccentric with up to 80% obstruction. . Widely patent RCA and circumflex. . Elevated left ventricular filling pressures with LVEDP of 32 and pulmonary capillary wedge pressure of 18 mmHg. . The procedure was very difficult due to significant tortuosity in the right subclavian/innominate artery ascending aortic junction. This prevented control of catheter movement.  RECOMMENDATIONS:  . Start Plavix if no contraindication . Optimize medical therapy for both heart failure and myocardial ischemia. . If symptomatic angina on medical therapy, consider femoral approach to treat the mid to distal LAD disease with stenting.  )      Anesthesia Quick Evaluation

## 2022-03-05 NOTE — Progress Notes (Signed)
Anesthesia Chart Review:  [facility]ology for history of PAD (s/p s/p orbital rotational atherectomy of severe tibial disease), HTN, HLD, OSA, CAD, cardiomyopathy.  He was admitted with congestive heart failure in August 2017 and underwent diuresis.  EF was 30 to 35% at the time.  He underwent left and right heart cath by Dr. Tamala Julian that showed elevated LVEDP and moderate diffuse CAD.  Medical therapy was recommended.  Last echocardiogram obtained on 02/16/2021 showed EF 55 to 60%, mild LVH, grade 1 DD, mild AI.  Repeat lower extremity arterial Doppler in December 2022 demonstrated 50 to 74% stenosis in the right posterior tibial artery, patent right common femoral, SFA and the popliteal artery.  Normal ABI bilaterally.  Carotid Doppler obtained in January 2023 showed 1 to 39% right ICA stenosis, no significant disease seen in the left carotid artery.  Patient was admitted for blurry vision in March 2023.  Symptom was concerning for temporal arteritis.  Neurology was consulted-he was treated with IV steroids.  Work-up however was negative for temporal arteritis.  Per neurology, there was concern of small brainstem infarct responsible for the patient's symptom.  He had improvement of diplopia.  He was discharged on aspirin and Plavix for 45-monthafter which time patient will continue on full dose aspirin by itself.  Patient last seen by HAlmyra Deforest PA-C 03/02/2022 for preop evaluation.  Per note, "Preoperative clearance: Patient has upcoming neck surgery by Dr. EEllene Route  Previous cardiac catheterization in 2017 showed moderate to severe distal vessel disease.  He denies any recent chest pain.  I discussed his case with DOD Dr. THarriet Masson patient is cleared to proceed with upcoming procedure.  He is at least a moderate risk patient given comorbidities.  He may stop Plavix for 7 days prior to the procedure.  He is on aspirin given the prior history of stroke.  We will defer high-dose aspirin management to neurology  service."   Patient reports last dose aspirin and Plavix 03/02/2022.  History of left CEA 2015, last carotid duplex 09/23/2020 showed 1 to 39% right ICA and patent left CEA site.  History of OSA, not currently on CPAP.  Non-insulin-dependent DM2, well controlled, A1c 6.1 on preop labs.  CMP and CBC from 02/24/2022 reviewed, mild anemia with hemoglobin 11.4, otherwise unremarkable.  TTE 02/16/2021:  1. Left ventricular ejection fraction, by estimation, is 55 to 60%. The  left ventricle has normal function. The left ventricle has no regional  wall motion abnormalities. There is mild left ventricular hypertrophy.  Left ventricular diastolic parameters  are consistent with Grade I diastolic dysfunction (impaired relaxation).   2. Right ventricular systolic function is normal. The right ventricular  size is normal.   3. Left atrial size was mildly dilated.   4. The mitral valve is normal in structure. No evidence of mitral valve  regurgitation. No evidence of mitral stenosis.   5. The aortic valve is tricuspid. Aortic valve regurgitation is mild. No  aortic stenosis is present.   6. The inferior vena cava is normal in size with greater than 50%  respiratory variability, suggesting right atrial pressure of 3 mmHg.   Carotid duplex 09/23/2020: Summary:  Right Carotid: Velocities in the right ICA are consistent with a 1-39% stenosis.  Left Carotid: The carotid endarterectomy site was well visualize demonstrating normal patency with no evidence of significant diameter reduction.  Vertebrals:  Bilateral vertebral arteries demonstrate antegrade flow.  Subclavians: Normal flow hemodynamics were seen in bilateral subclavian arteries.   Right/left cath  05/19/2016: Mid RCA to Dist RCA lesion, 50 %stenosed. Dist RCA lesion, 45 %stenosed. RPDA lesion, 50 %stenosed. Dist LAD-2 lesion, 50 %stenosed. Dist LAD-1 lesion, 80 %stenosed. Mid LAD lesion, 50 %stenosed. Prox LAD lesion, 40 %stenosed. 1st Mrg  lesion, 85 %stenosed. Hemodynamic findings consistent with moderate pulmonary hypertension.   Diffuse moderate to severe LAD disease. The most severe region is in the mid to distal vessel and is somewhat eccentric with up to 80% obstruction. Widely patent RCA and circumflex. Elevated left ventricular filling pressures with LVEDP of 32 and pulmonary capillary wedge pressure of 18 mmHg. The procedure was very difficult due to significant tortuosity in the right subclavian/innominate artery ascending aortic junction. This prevented control of catheter movement.   RECOMMENDATIONS:   Start Plavix if no contraindication Optimize medical therapy for both heart failure and myocardial ischemia. If symptomatic angina on medical therapy, consider femoral approach to treat the mid to distal LAD disease with stenting.   Wynonia Musty [facility] Short Stay Center/Anesthesiology Phone [phone] 03/05/2022 11:56 AM

## 2022-03-05 NOTE — Therapy (Signed)
[facility], [place], [address] Phone: [phone]   Fax:  [phone]  Wound Care Therapy  Patient Details  Name: [name] MRN: [id number] Date of Birth: Dec 08, 1945 Referring Provider (PT): Acey Lav DPM   Encounter Date: 03/05/2022   PT End of Session - 03/05/22 7408     Visit Number 3    Number of Visits 12    Date for PT Re-Evaluation 04/07/22    Authorization Type [employer], no VL, no auth ,    PT Start Time 1325    PT Stop Time 1350    PT Time Calculation (min) 25 min    Activity Tolerance Patient tolerated treatment well    Behavior During Therapy [facility] for tasks assessed/performed             Past Medical History:  Diagnosis Date   Aftercare following surgery of the circulatory system, NEC 12/04/2013   Angina decubitus ([place]) 05/19/2016   Arrhythmia 05/20/2016   Arthritis    ARTHRITIS, RIGHT FOOT 06/26/2008   Qualifier: Diagnosis of  By: Aline Brochure MD, [name]     Cardiomyopathy- EF NL 01/2013, now 30-35% echo 03/14/2012   Carotid artery occlusion    left s/p CEA   Cellulitis of left foot 05/07/2018   CHF (congestive heart failure) ([place])    Critical lower limb ischemia ([place]) 02/28/2014   Critical limb ischemia    Diabetes mellitus    Elevated troponin 04/11/2016   GERD (gastroesophageal reflux disease)    History of stroke June 2013 03/14/2012   HOH (hard of hearing)    Hypertension    Hypothyroidism    Nonhealing skin ulcer ([place]) 10/28/2014   Obstructive sleep apnea    Orthostatic hypotension    Osteomyelitis ([place])    left great toe   Pain in the chest 05/17/2016   Peripheral arterial disease ([place]), s/p PTA x 2 RLE    nonhealing ulcers bilaterally on each great toe   Pneumonia    PONV (postoperative nausea and vomiting)    Also hard to awake after anesthesia   Pulmonary nodule 05/17/2016   Restless leg syndrome    Shortness of breath    Stroke ([place])  03/08/2012   Toe osteomyelitis, left ([place])    Ulcer of great toe, left, with necrosis of bone ([place])    Wears dentures     Past Surgical History:  Procedure Laterality Date   AMPUTATION TOE Left 07/25/2018   Procedure: AMPUTATION TOE INTERPHALANGEAL HALLUX LEFT;  Surgeon: Evelina Bucy, DPM;  Location: [facility];  Service: Podiatry;  Laterality: Left;   ANGIOPLASTY  02/28/14   diamond back orbital rotational atherectomy of Rt. tibial   BACK SURGERY     BONE BIOPSY Left 07/25/2018   Procedure: SUPERFICIAL BONE BIOPSY;  Surgeon: Evelina Bucy, DPM;  Location: [facility];  Service: Podiatry;  Laterality: Left;   CARDIAC CATHETERIZATION N/A 05/19/2016   Procedure: Right/Left Heart Cath and Coronary Angiography;  Surgeon: Belva Crome, MD;  Location: [facility] CV LAB;  Service: Cardiovascular;  Laterality: N/A;   CERVICAL FUSION     ENDARTERECTOMY Left 11/29/2013   Procedure: ENDARTERECTOMY CAROTID;  Surgeon: Serafina Mitchell, MD;  Location[facility] OR;  Service: Vascular;  Laterality: Left;   ESOPHAGOGASTRODUODENOSCOPY  02/2010   Dr. Gala Romney: patient presented with food impaction, schatzki ring with superimposed component of stricture with erosive reflux esophagitis, s/p disimpaction but dilation planned at later date  ESOPHAGOGASTRODUODENOSCOPY (EGD) WITH PROPOFOL N/A 08/13/2019   Dr. Gala Romney: Erosive reflux esophagitis with mild stricture and incidental Mallory-Weiss tear which precluded esophageal dilation.  Medium sized hiatal hernia.   ESOPHAGOGASTRODUODENOSCOPY (EGD) WITH PROPOFOL N/A 10/11/2019   Dr. Gala Romney: Esophageal stenosis status post dilation, moderate hiatal hernia   EYE SURGERY     FLEXIBLE SIGMOIDOSCOPY N/A 08/13/2019   Procedure: FLEXIBLE SIGMOIDOSCOPY;  Surgeon: [name], MD;  [facility];  Service: Endoscopy;  Laterality: N/A;  colonoscopy aboerted due to formed stool and poor prep   FOOT SURGERY     KNEE ARTHROSCOPY WITH MEDIAL MENISECTOMY Left 05/06/2020   Procedure:  KNEE ARTHROSCOPY WITH MEDIAL MENISCECTOMY AND LATERAL MENISCECTOMY;  Surgeon: [name], MD;  Location: [facility];  Service: Orthopedics;  Laterality: Left;   Lower ext duplex doppler  03/14/14   Rt ABI 1.2   LOWER EXTREMITY ANGIOGRAM Bilateral 02/18/2014   Procedure: LOWER EXTREMITY ANGIOGRAM;  Surgeon: [name], MD;  Location: [facility] CATH LAB;  Service: Cardiovascular;  Laterality: Bilateral;   LOWER EXTREMITY ANGIOGRAM N/A 10/31/2014   Procedure: LOWER EXTREMITY ANGIOGRAM;  Surgeon: [name], MD;  Location: [facility] CATH LAB;  Service: Cardiovascular;  Laterality: N/A;   MALONEY DILATION N/A 10/11/2019   Procedure: Venia Minks DILATION;  Surgeon: [name], MD;  [facility];  Service: Endoscopy;  Laterality: N/A;   MULTIPLE TOOTH EXTRACTIONS     PV angiogram  02/18/2014   tibial vessel diseas bil.   SPINE SURGERY     tendon achillies lengthing and sesamoid      There were no vitals filed for this visit.               Wound Therapy - 03/05/22 0001     Subjective Pt stated plans on 03/11/22 for neck surgery. Arrived with new bandaid, had apt checking for infection control.    Patient and Family Stated Goals wound to heal    Date of Onset 02/17/22    Prior Treatments podiatrist    Pain Scale 0-10    Pain Score 0-No pain    Evaluation and Treatment Procedures Explained to Patient/Family Yes    Evaluation and Treatment Procedures agreed to    Wound Properties Date First Assessed: 02/24/22 Time First Assessed: 1315 Wound Type: Diabetic ulcer Location: Foot Location Orientation: Right Wound Description (Comments): R plantar foot wound Present on Admission: Yes   Wound Image Images linked: 1    Dressing Type --   vaseline, 2x2, medipore tape   Dressing Changed Changed    Dressing Status Clean, Dry, Intact    Dressing Change Frequency PRN    Site / Wound Assessment Red;Pink    % Wound base Red or Granulating 100%    % Wound base Yellow/Fibrinous Exudate 0%     Selective Debridement - Location callus plantar met head region    Selective Debridement - Tools Used Scalpel;Forceps    Selective Debridement - Tissue Removed callous devitalized tissue.    Wound Therapy - Clinical Statement Significant amount of callous removed surrounding wound to promote healing.  Changed dressings to vaseline, 2x2, and medipore tape with netting.  Reports of comfort at EOS.    Wound Therapy - Functional Problem List difficulty walking, washing, dressing    Factors Delaying/Impairing Wound Healing Diabetes Mellitus;Immobility;Multiple medical problems;Vascular compromise    Wound Plan debride and dressing changes as needed    Dressing  vaseline, 2x2, medipore tape with netting  PT Short Term Goals - 02/26/22 1351       PT SHORT TERM GOAL #1   Title Patient will be independent in self management strategies to decrease callous formation    Time 1    Period Days    Status Achieved    Target Date 10/05/21               PT Long Term Goals - 02/26/22 1351       PT LONG TERM GOAL #1   Title Patient will be independent in self management strategies to decrease callous formation    Time 6    Period Weeks    Status On-going    Target Date 04/07/22      PT LONG TERM GOAL #2   Title Patient wound to be healed to reduce risk of infection.    Time 6    Period Weeks    Status On-going    Target Date 04/07/22                    Patient will benefit from skilled therapeutic intervention in order to improve the following deficits and impairments:     Visit Diagnosis: Pain in right foot  Difficulty in walking, not elsewhere classified  Diabetic ulcer of right midfoot associated with diabetes mellitus due to underlying condition, limited to breakdown of skin [facility])     Problem List Patient Active Problem List   Diagnosis Date Noted   Cervical myelopathy ([place]) 02/23/2022   Hospital discharge follow-up  11/26/2021   Hyponatremia 11/26/2021   Prolonged QT interval 11/16/2021   GERD (gastroesophageal reflux disease) 11/16/2021   Blurred vision 11/16/2021   Hypothyroidism 10/05/2021   Hypokalemia 05/18/2021   Muscular deconditioning 05/12/2021   Frequent falls 05/12/2021   Fatigue 04/29/2021   Recent unexplained weight loss 04/23/2021   Left shoulder pain 04/23/2021   Chest tightness 04/23/2021   Deep tissue injury 04/23/2021   Myoclonus 04/15/2021   Pneumonia 04/03/2021   PNA (pneumonia) 04/02/2021   Abnormal finding on urinalysis 04/01/2021   Syncope 02/16/2021   Minor head injury    Right hip pain 02/11/2021   Erectile dysfunction 01/22/2021   Gait abnormality 09/30/2020   Mild non proliferative diabetic retinopathy ([place]) 07/16/2020   Need for immunization against influenza 06/03/2020   S/P left knee arthroscopy 05/06/20 05/13/2020   Abnormal MRI 03/26/2020   Joint disorder of knee 03/26/2020   Fall at home, initial encounter 03/13/2020   Anemia, deficiency 09/03/2019   Reflux esophagitis 09/03/2019   Esophageal dysphagia 05/30/2019   Constipation 05/30/2019   Positive colorectal cancer screening using Cologuard test 05/30/2019   Diabetic foot ulcer ([place]) 09/06/2018   Orthostasis 08/25/2016   Congestive heart failure ([place]) 05/17/2016   Bilateral carotid artery disease ([place]) 07/08/2014   Obstructive sleep apnea 02/12/2014   Restless legs 10/23/2013   Overweight with body mass index (BMI) of 29 to 29.9 in adult 10/23/2013   Hyperlipidemia 07/16/2013   Peripheral arterial disease ([place]) 07/16/2013   Hypertension 03/14/2012   Type 2 diabetes mellitus with circulatory disorder ([place]) 03/14/2012   Peripheral neuropathy 03/14/2012   Cardiomyopathy- EF NL 01/2013, now 30-35% echo 03/14/2012   [name], LPTA/CLT; CBIS [phone]  [name], PTA 03/05/2022, 2:05 PM  [facility] [place] [address] [place], [place],  [address] Phone: [phone]   Fax:  [phone]  Name: [name] MRN: [id number] Date of Birth: [birth year]

## 2022-03-08 ENCOUNTER — Telehealth (HOSPITAL_COMMUNITY): Payer: Self-pay | Admitting: Physical Therapy

## 2022-03-08 NOTE — Telephone Encounter (Signed)
Patient will have surgery on Thursday 03/11/2022 he will be out of wound care for two weeks.

## 2022-03-10 ENCOUNTER — Ambulatory Visit (HOSPITAL_COMMUNITY): Payer: HMO

## 2022-03-10 ENCOUNTER — Encounter (HOSPITAL_COMMUNITY): Payer: Self-pay

## 2022-03-10 DIAGNOSIS — M79671 Pain in right foot: Secondary | ICD-10-CM | POA: Insufficient documentation

## 2022-03-10 DIAGNOSIS — E08621 Diabetes mellitus due to underlying condition with foot ulcer: Secondary | ICD-10-CM | POA: Insufficient documentation

## 2022-03-10 DIAGNOSIS — R262 Difficulty in walking, not elsewhere classified: Secondary | ICD-10-CM | POA: Insufficient documentation

## 2022-03-10 DIAGNOSIS — L97411 Non-pressure chronic ulcer of right heel and midfoot limited to breakdown of skin: Secondary | ICD-10-CM | POA: Insufficient documentation

## 2022-03-10 NOTE — Therapy (Addendum)
[facility] [address] [place], [place], [address] Phone: [phone]   Fax:  [phone]  Wound Care Therapy  Patient Details  Name: [name] MRN: [id number] Date of Birth: Feb 13, 1946 Referring Provider (PT): Acey Lav DPM   Encounter Date: 03/10/2022  PHYSICAL THERAPY DISCHARGE SUMMARY  Visits from Start of Care: 4  Current functional level related to goals / functional outcomes: See below   Remaining deficits: See below   Education / Equipment: See below   Patient agrees to discharge. Patient goals were met. Patient is being discharged due to meeting the stated rehab goals.  9:22 AM, 03/18/22 Mearl Latin PT, DPT Physical Therapist at [facility]    PT End of Session - 03/10/22 1105     Visit Number 4    Number of Visits 12    Date for PT Re-Evaluation 04/07/22    Authorization Type [employer], no VL, no auth ,    PT Start Time 1032    PT Stop Time 1042    PT Time Calculation (min) 10 min    Activity Tolerance Patient tolerated treatment well    Behavior During Therapy [facility] for tasks assessed/performed             Past Medical History:  Diagnosis Date   Aftercare following surgery of the circulatory system, NEC 12/04/2013   Angina decubitus ([place]) 05/19/2016   Arrhythmia 05/20/2016   Arthritis    ARTHRITIS, RIGHT FOOT 06/26/2008   Qualifier: Diagnosis of  By: Aline Brochure MD, [name]     Cardiomyopathy- EF NL 01/2013, now 30-35% echo 03/14/2012   Carotid artery occlusion    left s/p CEA   Cellulitis of left foot 05/07/2018   CHF (congestive heart failure) ([place])    Critical lower limb ischemia ([facility]) 02/28/2014   Critical limb ischemia    Diabetes mellitus    Elevated troponin 04/11/2016   GERD (gastroesophageal reflux disease)    History of stroke June 2013 03/14/2012   HOH (hard of hearing)    Hypertension    Hypothyroidism    Nonhealing skin ulcer  ([place]) 10/28/2014   Obstructive sleep apnea    Orthostatic hypotension    Osteomyelitis (HCC)    left great toe   Pain in the chest 05/17/2016   Peripheral arterial disease ([place]), s/p PTA x 2 RLE    nonhealing ulcers bilaterally on each great toe   Pneumonia    PONV (postoperative nausea and vomiting)    Also hard to awake after anesthesia   Pulmonary nodule 05/17/2016   Restless leg syndrome    Shortness of breath    Stroke ([place]) 03/08/2012   Toe osteomyelitis, left (HCC)    Ulcer of great toe, left, with necrosis of bone ([place])    Wears dentures     Past Surgical History:  Procedure Laterality Date   AMPUTATION TOE Left 07/25/2018   Procedure: AMPUTATION TOE INTERPHALANGEAL HALLUX LEFT;  Surgeon: Evelina Bucy, DPM;  Location: [facility];  Service: Podiatry;  Laterality: Left;   ANGIOPLASTY  02/28/14   diamond back orbital rotational atherectomy of Rt. tibial   BACK SURGERY     BONE BIOPSY Left 07/25/2018   Procedure: SUPERFICIAL BONE BIOPSY;  Surgeon: Evelina Bucy, DPM;  Location: [facility];  Service: Podiatry;  Laterality: Left;   CARDIAC CATHETERIZATION N/A 05/19/2016   Procedure: Right/Left Heart Cath and Coronary Angiography;  Surgeon: Belva Crome, MD;  Location: [facility]  CV LAB;  Service: Cardiovascular;  Laterality: N/A;   CERVICAL FUSION     ENDARTERECTOMY Left 11/29/2013   Procedure: ENDARTERECTOMY CAROTID;  Surgeon: Serafina Mitchell, MD;  Location: [facility];  Service: Vascular;  Laterality: Left;   ESOPHAGOGASTRODUODENOSCOPY  02/2010   Dr. Gala Romney: patient presented with food impaction, schatzki ring with superimposed component of stricture with erosive reflux esophagitis, s/p disimpaction but dilation planned at later date    ESOPHAGOGASTRODUODENOSCOPY (EGD) WITH PROPOFOL N/A 08/13/2019   Dr. Gala Romney: Erosive reflux esophagitis with mild stricture and incidental Mallory-Weiss tear which precluded esophageal dilation.  Medium sized hiatal hernia.   ESOPHAGOGASTRODUODENOSCOPY  (EGD) WITH PROPOFOL N/A 10/11/2019   Dr. Gala Romney: Esophageal stenosis status post dilation, moderate hiatal hernia   EYE SURGERY     FLEXIBLE SIGMOIDOSCOPY N/A 08/13/2019   Procedure: FLEXIBLE SIGMOIDOSCOPY;  Surgeon: [name], MD;  [facility];  Service: Endoscopy;  Laterality: N/A;  colonoscopy aboerted due to formed stool and poor prep   FOOT SURGERY     KNEE ARTHROSCOPY WITH MEDIAL MENISECTOMY Left 05/06/2020   Procedure: KNEE ARTHROSCOPY WITH MEDIAL MENISCECTOMY AND LATERAL MENISCECTOMY;  Surgeon: [name], MD;  Location: [facility];  Service: Orthopedics;  Laterality: Left;   Lower ext duplex doppler  03/14/14   Rt ABI 1.2   LOWER EXTREMITY ANGIOGRAM Bilateral 02/18/2014   Procedure: LOWER EXTREMITY ANGIOGRAM;  Surgeon: [name], MD;  [facility] C[facility];  Service: Cardiovascular;  Laterality: Bilateral;   LOWER EXTREMITY ANGIOGRAM N/A 10/31/2014   Procedure: LOWER EXTREMITY ANGIOGRAM;  Surgeon: [name], MD;  Location: [facility] CATH LAB;  Service: Cardiovascular;  Laterality: N/A;   MALONEY DILATION N/A 10/11/2019   Procedure: Venia Minks DILATION;  Surgeon: [name], MD;  [facility];  Service: Endoscopy;  Laterality: N/A;   MULTIPLE TOOTH EXTRACTIONS     PV angiogram  02/18/2014   tibial vessel diseas bil.   SPINE SURGERY     tendon achillies lengthing and sesamoid      There were no vitals filed for this visit.    Subjective Assessment - 03/10/22 1100     Subjective Pt stated bandage came off during sleep, arrived with new bandaid on wound.  No reports of pain currenlty, stated he is nervous about surgery tomorrow.    Currently in Pain? No/denies                       Wound Therapy - 03/10/22 0001     Subjective Pt stated bandage came off during sleep, arrived with new bandaid on wound.  No reports of pain currenlty, stated he is nervous about surgery tomorrow.    Patient and Family Stated Goals wound to heal     Date of Onset 02/17/22    Prior Treatments podiatrist    Pain Scale 0-10    Pain Score 0-No pain    Evaluation and Treatment Procedures Explained to Patient/Family Yes    Evaluation and Treatment Procedures agreed to    Wound Properties Date First Assessed: 02/24/22 Time First Assessed: 1443 Wound Type: Diabetic ulcer Location: Foot Location Orientation: Right Wound Description (Comments): R plantar foot wound Present on Admission: Yes   Wound Image Images linked: 1    Dressing Type --   vaseline, bandaid   Dressing Changed Changed    Dressing Status Clean, Dry, Intact    Dressing Change Frequency PRN    Site / Wound Assessment Granulation tissue    %  Wound base Red or Granulating 100%    % Wound base Yellow/Fibrinous Exudate 0%    Wound Length (cm) 0 cm    Wound Width (cm) 0 cm    Wound Surface Area (cm^2) 0 cm^2    Drainage Amount None    Treatment Cleansed    Selective Debridement - Location no debridement necessary    Wound Therapy - Clinical Statement Pt arrived with no additional callous.  Foot cleansed well, noted increased dirt between great toe and 2nd toe.  Educated proper care at home, encouraged to keep feet moistured, stated he has diabetic foot cream at home.    Wound Therapy - Functional Problem List difficulty walking, washing, dressing    Factors Delaying/Impairing Wound Healing Diabetes Mellitus;Immobility;Multiple medical problems;Vascular compromise    Wound Plan DC to self care    Dressing  vaseline, bandaid             Assessment: Patient seen by PTA for session. Patient without wound present and no additional callus formation. Patient has met all goals and is discharged from PT at this time.   9:22 AM, 03/18/22 Mearl Latin PT, DPT Physical Therapist at [facility]           PT Short Term Goals - 02/26/22 1351       PT SHORT TERM GOAL #1   Title Patient will be independent in self management strategies to decrease  callous formation    Time 1    Period Days    Status Achieved    Target Date 10/05/21               PT Long Term Goals - 02/26/22 1351       PT LONG TERM GOAL #1   Title Patient will be independent in self management strategies to decrease callous formation    Time 6    Period Weeks    Status Achieved   Target Date 04/07/22      PT LONG TERM GOAL #2   Title Patient wound to be healed to reduce risk of infection.    Time 6    Period Weeks    Status Achieved   Target Date 04/07/22                    Patient will benefit from skilled therapeutic intervention in order to improve the following deficits and impairments:     Visit Diagnosis: Pain in right foot  Difficulty in walking, not elsewhere classified  Diabetic ulcer of right midfoot associated with diabetes mellitus due to underlying condition, limited to breakdown of skin [facility])     Problem List Patient Active Problem List   Diagnosis Date Noted   Cervical myelopathy ([place]) 02/23/2022   Hospital discharge follow-up 11/26/2021   Hyponatremia 11/26/2021   Prolonged QT interval 11/16/2021   GERD (gastroesophageal reflux disease) 11/16/2021   Blurred vision 11/16/2021   Hypothyroidism 10/05/2021   Hypokalemia 05/18/2021   Muscular deconditioning 05/12/2021   Frequent falls 05/12/2021   Fatigue 04/29/2021   Recent unexplained weight loss 04/23/2021   Left shoulder pain 04/23/2021   Chest tightness 04/23/2021   Deep tissue injury 04/23/2021   Myoclonus 04/15/2021   Pneumonia 04/03/2021   PNA (pneumonia) 04/02/2021   Abnormal finding on urinalysis 04/01/2021   Syncope 02/16/2021   Minor head injury    Right hip pain 02/11/2021   Erectile dysfunction 01/22/2021   Gait abnormality 09/30/2020   Mild non proliferative  diabetic retinopathy (HCC) 07/16/2020   Need for immunization against influenza 06/03/2020   S/P left knee arthroscopy 05/06/20 05/13/2020   Abnormal MRI 03/26/2020   Joint  disorder of knee 03/26/2020   Fall at home, initial encounter 03/13/2020   Anemia, deficiency 09/03/2019   Reflux esophagitis 09/03/2019   Esophageal dysphagia 05/30/2019   Constipation 05/30/2019   Positive colorectal cancer screening using Cologuard test 05/30/2019   Diabetic foot ulcer (HCC) 09/06/2018   Orthostasis 08/25/2016   Congestive heart failure (HCC) 05/17/2016   Bilateral carotid artery disease (HCC) 07/08/2014   Obstructive sleep apnea 02/12/2014   Restless legs 10/23/2013   Overweight with body mass index (BMI) of 29 to 29.9 in adult 10/23/2013   Hyperlipidemia 07/16/2013   Peripheral arterial disease (HCC) 07/16/2013   Hypertension 03/14/2012   Type 2 diabetes mellitus with circulatory disorder (HCC) 03/14/2012   Peripheral neuropathy 03/14/2012   Cardiomyopathy- EF NL 01/2013, now 30-35% echo 03/14/2012   Casey Cockerham, LPTA/CLT; CBIS [phone]  Cockerham, Casey Jo, PTA 03/10/2022, 11:06 AM  [facility] [address] [place], [place], [address] Phone: [phone]   Fax:  [phone]  Name: Levaughn E Frie MRN: [id number] Date of Birth: 11/13/1945     

## 2022-03-11 ENCOUNTER — Inpatient Hospital Stay (HOSPITAL_COMMUNITY): Payer: HMO

## 2022-03-11 ENCOUNTER — Inpatient Hospital Stay (HOSPITAL_COMMUNITY): Payer: HMO | Admitting: Certified Registered Nurse Anesthetist

## 2022-03-11 ENCOUNTER — Encounter (HOSPITAL_COMMUNITY)
Admission: RE | Disposition: A | Discharge status: Skilled Nursing Facility | Payer: Self-pay | Source: Home / Self Care | Attending: Neurological Surgery

## 2022-03-11 ENCOUNTER — Encounter (HOSPITAL_COMMUNITY): Payer: Self-pay | Admitting: Neurological Surgery

## 2022-03-11 ENCOUNTER — Inpatient Hospital Stay (HOSPITAL_COMMUNITY): Payer: HMO | Admitting: Physician Assistant

## 2022-03-11 ENCOUNTER — Inpatient Hospital Stay (HOSPITAL_COMMUNITY): Payer: HMO | Admitting: Anesthesiology

## 2022-03-11 ENCOUNTER — Inpatient Hospital Stay (HOSPITAL_COMMUNITY)
Admission: RE | Disposition: A | Discharge status: Skilled Nursing Facility | Payer: Self-pay | Source: Home / Self Care | Attending: Neurological Surgery

## 2022-03-11 ENCOUNTER — Inpatient Hospital Stay (HOSPITAL_COMMUNITY)
Admission: RE | Admit: 2022-03-11 | Discharge: 2022-03-23 | DRG: 471 | Disposition: A | Discharge status: Skilled Nursing Facility | Payer: HMO | Attending: Neurosurgery | Admitting: Neurosurgery

## 2022-03-11 ENCOUNTER — Other Ambulatory Visit: Payer: Self-pay

## 2022-03-11 DIAGNOSIS — Z751 Person awaiting admission to adequate facility elsewhere: Secondary | ICD-10-CM | POA: Diagnosis not present

## 2022-03-11 DIAGNOSIS — Z8249 Family history of ischemic heart disease and other diseases of the circulatory system: Secondary | ICD-10-CM

## 2022-03-11 DIAGNOSIS — E1165 Type 2 diabetes mellitus with hyperglycemia: Secondary | ICD-10-CM | POA: Diagnosis not present

## 2022-03-11 DIAGNOSIS — Z833 Family history of diabetes mellitus: Secondary | ICD-10-CM

## 2022-03-11 DIAGNOSIS — Y838 Other surgical procedures as the cause of abnormal reaction of the patient, or of later complication, without mention of misadventure at the time of the procedure: Secondary | ICD-10-CM | POA: Diagnosis present

## 2022-03-11 DIAGNOSIS — Z79890 Hormone replacement therapy (postmenopausal): Secondary | ICD-10-CM | POA: Diagnosis not present

## 2022-03-11 DIAGNOSIS — I10 Essential (primary) hypertension: Secondary | ICD-10-CM | POA: Diagnosis present

## 2022-03-11 DIAGNOSIS — R0982 Postnasal drip: Secondary | ICD-10-CM | POA: Diagnosis present

## 2022-03-11 DIAGNOSIS — E1151 Type 2 diabetes mellitus with diabetic peripheral angiopathy without gangrene: Secondary | ICD-10-CM | POA: Diagnosis present

## 2022-03-11 DIAGNOSIS — M96840 Postprocedural hematoma of a musculoskeletal structure following a musculoskeletal system procedure: Secondary | ICD-10-CM

## 2022-03-11 DIAGNOSIS — E039 Hypothyroidism, unspecified: Secondary | ICD-10-CM

## 2022-03-11 DIAGNOSIS — E43 Unspecified severe protein-calorie malnutrition: Secondary | ICD-10-CM | POA: Diagnosis present

## 2022-03-11 DIAGNOSIS — R053 Chronic cough: Secondary | ICD-10-CM | POA: Diagnosis present

## 2022-03-11 DIAGNOSIS — K219 Gastro-esophageal reflux disease without esophagitis: Secondary | ICD-10-CM | POA: Diagnosis not present

## 2022-03-11 DIAGNOSIS — Z8673 Personal history of transient ischemic attack (TIA), and cerebral infarction without residual deficits: Secondary | ICD-10-CM

## 2022-03-11 DIAGNOSIS — M4802 Spinal stenosis, cervical region: Secondary | ICD-10-CM | POA: Diagnosis present

## 2022-03-11 DIAGNOSIS — E1159 Type 2 diabetes mellitus with other circulatory complications: Secondary | ICD-10-CM

## 2022-03-11 DIAGNOSIS — Z87891 Personal history of nicotine dependence: Secondary | ICD-10-CM

## 2022-03-11 DIAGNOSIS — Z981 Arthrodesis status: Secondary | ICD-10-CM

## 2022-03-11 DIAGNOSIS — R531 Weakness: Secondary | ICD-10-CM

## 2022-03-11 DIAGNOSIS — G4700 Insomnia, unspecified: Secondary | ICD-10-CM | POA: Diagnosis present

## 2022-03-11 DIAGNOSIS — I110 Hypertensive heart disease with heart failure: Secondary | ICD-10-CM

## 2022-03-11 DIAGNOSIS — G2581 Restless legs syndrome: Secondary | ICD-10-CM | POA: Diagnosis present

## 2022-03-11 DIAGNOSIS — Z89422 Acquired absence of other left toe(s): Secondary | ICD-10-CM

## 2022-03-11 DIAGNOSIS — T380X5A Adverse effect of glucocorticoids and synthetic analogues, initial encounter: Secondary | ICD-10-CM | POA: Diagnosis not present

## 2022-03-11 DIAGNOSIS — G4733 Obstructive sleep apnea (adult) (pediatric): Secondary | ICD-10-CM | POA: Diagnosis present

## 2022-03-11 DIAGNOSIS — J310 Chronic rhinitis: Secondary | ICD-10-CM | POA: Diagnosis present

## 2022-03-11 DIAGNOSIS — G9761 Postprocedural hematoma of a nervous system organ or structure following a nervous system procedure: Secondary | ICD-10-CM | POA: Diagnosis present

## 2022-03-11 DIAGNOSIS — M2578 Osteophyte, vertebrae: Secondary | ICD-10-CM | POA: Diagnosis present

## 2022-03-11 DIAGNOSIS — E871 Hypo-osmolality and hyponatremia: Secondary | ICD-10-CM | POA: Diagnosis not present

## 2022-03-11 DIAGNOSIS — Z6827 Body mass index (BMI) 27.0-27.9, adult: Secondary | ICD-10-CM

## 2022-03-11 DIAGNOSIS — I509 Heart failure, unspecified: Secondary | ICD-10-CM | POA: Diagnosis not present

## 2022-03-11 DIAGNOSIS — E785 Hyperlipidemia, unspecified: Secondary | ICD-10-CM | POA: Diagnosis present

## 2022-03-11 DIAGNOSIS — E119 Type 2 diabetes mellitus without complications: Secondary | ICD-10-CM

## 2022-03-11 DIAGNOSIS — D72829 Elevated white blood cell count, unspecified: Secondary | ICD-10-CM | POA: Diagnosis present

## 2022-03-11 DIAGNOSIS — R490 Dysphonia: Secondary | ICD-10-CM | POA: Diagnosis not present

## 2022-03-11 DIAGNOSIS — Y92239 Unspecified place in hospital as the place of occurrence of the external cause: Secondary | ICD-10-CM | POA: Diagnosis present

## 2022-03-11 DIAGNOSIS — J9601 Acute respiratory failure with hypoxia: Secondary | ICD-10-CM | POA: Diagnosis not present

## 2022-03-11 DIAGNOSIS — R54 Age-related physical debility: Secondary | ICD-10-CM | POA: Diagnosis present

## 2022-03-11 DIAGNOSIS — M4712 Other spondylosis with myelopathy, cervical region: Secondary | ICD-10-CM | POA: Diagnosis present

## 2022-03-11 DIAGNOSIS — R1310 Dysphagia, unspecified: Secondary | ICD-10-CM

## 2022-03-11 HISTORY — PX: EVACUATION OF CERVICAL HEMATOMA: SHX6695

## 2022-03-11 HISTORY — PX: ANTERIOR CERVICAL CORPECTOMY: SHX1159

## 2022-03-11 LAB — POCT I-STAT, CHEM 8
BUN: 13 mg/dL (ref 8–23)
Calcium, Ion: 1.08 mmol/L — ABNORMAL LOW (ref 1.15–1.40)
Chloride: 101 mmol/L (ref 98–111)
Creatinine, Ser: 0.6 mg/dL — ABNORMAL LOW (ref 0.61–1.24)
Glucose, Bld: 215 mg/dL — ABNORMAL HIGH (ref 70–99)
HCT: 28 % — ABNORMAL LOW (ref 39.0–52.0)
Hemoglobin: 9.5 g/dL — ABNORMAL LOW (ref 13.0–17.0)
Potassium: 4.2 mmol/L (ref 3.5–5.1)
Sodium: 138 mmol/L (ref 135–145)
TCO2: 23 mmol/L (ref 22–32)

## 2022-03-11 LAB — GLUCOSE, CAPILLARY
Glucose-Capillary: 178 mg/dL — ABNORMAL HIGH (ref 70–99)
Glucose-Capillary: 122 mg/dL — ABNORMAL HIGH (ref 70–99)
Glucose-Capillary: 183 mg/dL — ABNORMAL HIGH (ref 70–99)
Glucose-Capillary: 257 mg/dL — ABNORMAL HIGH (ref 70–99)

## 2022-03-11 LAB — PREPARE RBC (CROSSMATCH)

## 2022-03-11 SURGERY — ANTERIOR CERVICAL CORPECTOMY
Anesthesia: General

## 2022-03-11 SURGERY — EVACUATION OF CERVICAL HEMATOMA
Anesthesia: General | Site: Spine Cervical

## 2022-03-11 MED ORDER — FENTANYL CITRATE (PF) 250 MCG/5ML IJ SOLN
INTRAMUSCULAR | Status: AC
  Filled 2022-03-11: qty 5

## 2022-03-11 MED ORDER — METHOCARBAMOL 500 MG PO TABS
500.0000 mg | ORAL_TABLET | Freq: Four times a day (QID) | ORAL | Status: DC | PRN
  Administered 2022-03-12: 500 mg via ORAL
  Filled 2022-03-11: qty 1

## 2022-03-11 MED ORDER — SENNA 8.6 MG PO TABS: 1.0000 | ORAL_TABLET | Freq: Two times a day (BID) | ORAL | Status: DC

## 2022-03-11 MED ORDER — PHENOL 1.4 % MT LIQD
1.0000 | OROMUCOSAL | Status: DC | PRN
  Administered 2022-03-15: 1 via OROMUCOSAL
  Filled 2022-03-11: qty 177

## 2022-03-11 MED ORDER — EPHEDRINE SULFATE (PRESSORS) 50 MG/ML IJ SOLN
INTRAMUSCULAR | Status: DC | PRN
  Administered 2022-03-11: 5 mg via INTRAVENOUS
  Administered 2022-03-11: 10 mg via INTRAVENOUS

## 2022-03-11 MED ORDER — ROPINIROLE HCL 0.5 MG PO TABS
0.5000 mg | ORAL_TABLET | Freq: Three times a day (TID) | ORAL | Status: DC
  Administered 2022-03-12: 0.5 mg via ORAL
  Filled 2022-03-11 (×4): qty 1

## 2022-03-11 MED ORDER — FUROSEMIDE 40 MG PO TABS
40.0000 mg | ORAL_TABLET | Freq: Two times a day (BID) | ORAL | Status: DC
  Administered 2022-03-12: 40 mg via ORAL
  Filled 2022-03-11: qty 1

## 2022-03-11 MED ORDER — LIDOCAINE-EPINEPHRINE 1 %-1:100000 IJ SOLN
INTRAMUSCULAR | Status: DC | PRN
  Administered 2022-03-11: 4 mL

## 2022-03-11 MED ORDER — POLYETHYLENE GLYCOL 3350 17 G PO PACK: 17.0000 g | PACK | Freq: Every day | ORAL | Status: DC | PRN

## 2022-03-11 MED ORDER — CEFAZOLIN SODIUM-DEXTROSE 2-4 GM/100ML-% IV SOLN: 2.0000 g | Freq: Three times a day (TID) | INTRAVENOUS | Status: DC

## 2022-03-11 MED ORDER — ORAL CARE MOUTH RINSE: 15.0000 mL | Freq: Once | OROMUCOSAL | Status: AC

## 2022-03-11 MED ORDER — PROPOFOL 500 MG/50ML IV EMUL
INTRAVENOUS | Status: DC | PRN
  Administered 2022-03-11: 25 ug/kg/min via INTRAVENOUS

## 2022-03-11 MED ORDER — PHENYLEPHRINE 80 MCG/ML (10ML) SYRINGE FOR IV PUSH (FOR BLOOD PRESSURE SUPPORT)
PREFILLED_SYRINGE | INTRAVENOUS | Status: AC
  Filled 2022-03-11: qty 10

## 2022-03-11 MED ORDER — SODIUM CHLORIDE 0.9 % IV SOLN: 250.0000 mL | INTRAVENOUS | Status: DC

## 2022-03-11 MED ORDER — FENTANYL CITRATE (PF) 250 MCG/5ML IJ SOLN
INTRAMUSCULAR | Status: DC | PRN
  Administered 2022-03-11: 100 ug via INTRAVENOUS
  Administered 2022-03-11: 50 ug via INTRAVENOUS
  Administered 2022-03-11: 25 ug via INTRAVENOUS
  Administered 2022-03-11: 50 ug via INTRAVENOUS
  Administered 2022-03-11: 25 ug via INTRAVENOUS

## 2022-03-11 MED ORDER — LIDOCAINE 2% (20 MG/ML) 5 ML SYRINGE
INTRAMUSCULAR | Status: AC
  Filled 2022-03-11: qty 5

## 2022-03-11 MED ORDER — SODIUM CHLORIDE 0.9% FLUSH: 3.0000 mL | INTRAVENOUS | Status: DC | PRN

## 2022-03-11 MED ORDER — CARVEDILOL 3.125 MG PO TABS
1.5625 mg | ORAL_TABLET | Freq: Once | ORAL | Status: AC
Start: 2022-03-11 — End: 2022-03-11
  Administered 2022-03-11: 1.5625 mg via ORAL
  Filled 2022-03-11: qty 1

## 2022-03-11 MED ORDER — THROMBIN 20000 UNITS EX SOLR
CUTANEOUS | Status: AC
Start: 2022-03-11 — End: ?
  Filled 2022-03-11: qty 20000

## 2022-03-11 MED ORDER — SUCCINYLCHOLINE CHLORIDE 200 MG/10ML IV SOSY
PREFILLED_SYRINGE | INTRAVENOUS | Status: DC | PRN
  Administered 2022-03-11: 100 mg via INTRAVENOUS

## 2022-03-11 MED ORDER — INSULIN ASPART 100 UNIT/ML IJ SOLN: 0.0000 [IU] | INTRAMUSCULAR | Status: DC | PRN

## 2022-03-11 MED ORDER — CHLORHEXIDINE GLUCONATE CLOTH 2 % EX PADS: 6.0000 | MEDICATED_PAD | Freq: Once | CUTANEOUS | Status: DC

## 2022-03-11 MED ORDER — PROPOFOL 1000 MG/100ML IV EMUL
INTRAVENOUS | Status: AC
  Filled 2022-03-11: qty 100

## 2022-03-11 MED ORDER — DEXAMETHASONE SODIUM PHOSPHATE 10 MG/ML IJ SOLN
INTRAMUSCULAR | Status: DC | PRN
  Administered 2022-03-11: 10 mg via INTRAVENOUS

## 2022-03-11 MED ORDER — FLEET ENEMA 7-19 GM/118ML RE ENEM: 1.0000 | ENEMA | Freq: Once | RECTAL | Status: DC | PRN

## 2022-03-11 MED ORDER — PROPOFOL 10 MG/ML IV BOLUS
INTRAVENOUS | Status: DC | PRN
  Administered 2022-03-11: 60 mg via INTRAVENOUS

## 2022-03-11 MED ORDER — PHENOL 1.4 % MT LIQD: 1.0000 | OROMUCOSAL | Status: DC | PRN

## 2022-03-11 MED ORDER — PANTOPRAZOLE SODIUM 40 MG PO TBEC: 40.0000 mg | DELAYED_RELEASE_TABLET | Freq: Every day | ORAL | Status: DC

## 2022-03-11 MED ORDER — THROMBIN 5000 UNITS EX SOLR
CUTANEOUS | Status: AC
  Filled 2022-03-11: qty 5000

## 2022-03-11 MED ORDER — LACTATED RINGERS IV SOLN: INTRAVENOUS | Status: DC | PRN

## 2022-03-11 MED ORDER — ACETAMINOPHEN 500 MG PO TABS
ORAL_TABLET | ORAL | Status: AC
  Administered 2022-03-11: 1000 mg via ORAL
  Filled 2022-03-11: qty 2

## 2022-03-11 MED ORDER — THROMBIN 5000 UNITS EX SOLR: OROMUCOSAL | Status: DC | PRN

## 2022-03-11 MED ORDER — CEFAZOLIN SODIUM-DEXTROSE 2-4 GM/100ML-% IV SOLN
2.0000 g | Freq: Three times a day (TID) | INTRAVENOUS | Status: AC
  Administered 2022-03-12 (×2): 2 g via INTRAVENOUS
  Filled 2022-03-11 (×2): qty 100

## 2022-03-11 MED ORDER — THROMBIN 5000 UNITS EX SOLR
OROMUCOSAL | Status: DC | PRN
  Administered 2022-03-11 (×4): 5 mL via TOPICAL

## 2022-03-11 MED ORDER — 0.9 % SODIUM CHLORIDE (POUR BTL) OPTIME
TOPICAL | Status: DC | PRN
  Administered 2022-03-11: 1000 mL

## 2022-03-11 MED ORDER — ROCURONIUM BROMIDE 10 MG/ML (PF) SYRINGE
PREFILLED_SYRINGE | INTRAVENOUS | Status: DC | PRN
  Administered 2022-03-11: 100 mg via INTRAVENOUS
  Administered 2022-03-11: 20 mg via INTRAVENOUS
  Administered 2022-03-11: 10 mg via INTRAVENOUS

## 2022-03-11 MED ORDER — ROSUVASTATIN CALCIUM 20 MG PO TABS: 20.0000 mg | ORAL_TABLET | Freq: Every day | ORAL | Status: DC

## 2022-03-11 MED ORDER — BISACODYL 10 MG RE SUPP: 10.0000 mg | Freq: Every day | RECTAL | Status: DC | PRN

## 2022-03-11 MED ORDER — MENTHOL 3 MG MT LOZG
1.0000 | LOZENGE | OROMUCOSAL | Status: DC | PRN
Start: 2022-03-11 — End: 2022-03-23

## 2022-03-11 MED ORDER — PHENYLEPHRINE 80 MCG/ML (10ML) SYRINGE FOR IV PUSH (FOR BLOOD PRESSURE SUPPORT)
PREFILLED_SYRINGE | INTRAVENOUS | Status: DC | PRN
  Administered 2022-03-11 (×2): 80 ug via INTRAVENOUS
  Administered 2022-03-11: 40 ug via INTRAVENOUS

## 2022-03-11 MED ORDER — ONDANSETRON HCL 4 MG/2ML IJ SOLN
INTRAMUSCULAR | Status: DC | PRN
  Administered 2022-03-11: 4 mg via INTRAVENOUS

## 2022-03-11 MED ORDER — TRANEXAMIC ACID-NACL 1000-0.7 MG/100ML-% IV SOLN
INTRAVENOUS | Status: AC
  Filled 2022-03-11: qty 100

## 2022-03-11 MED ORDER — FENTANYL CITRATE (PF) 100 MCG/2ML IJ SOLN
25.0000 ug | INTRAMUSCULAR | Status: DC | PRN
  Administered 2022-03-11: 25 ug via INTRAVENOUS

## 2022-03-11 MED ORDER — PROPOFOL 10 MG/ML IV BOLUS
INTRAVENOUS | Status: AC
  Filled 2022-03-11: qty 20

## 2022-03-11 MED ORDER — SUGAMMADEX SODIUM 200 MG/2ML IV SOLN
INTRAVENOUS | Status: DC | PRN
  Administered 2022-03-11 (×2): 100 mg via INTRAVENOUS

## 2022-03-11 MED ORDER — DOCUSATE SODIUM 100 MG PO CAPS: 100.0000 mg | ORAL_CAPSULE | Freq: Two times a day (BID) | ORAL | Status: DC

## 2022-03-11 MED ORDER — PHENYLEPHRINE HCL-NACL 20-0.9 MG/250ML-% IV SOLN
INTRAVENOUS | Status: DC | PRN
  Administered 2022-03-11: 35 ug/min via INTRAVENOUS

## 2022-03-11 MED ORDER — NITROGLYCERIN 0.4 MG SL SUBL
0.4000 mg | SUBLINGUAL_TABLET | SUBLINGUAL | Status: DC | PRN
Start: 2022-03-11 — End: 2022-03-23

## 2022-03-11 MED ORDER — EPHEDRINE 5 MG/ML INJ
INTRAVENOUS | Status: AC
  Filled 2022-03-11: qty 10

## 2022-03-11 MED ORDER — HEMOSTATIC AGENTS (NO CHARGE) OPTIME
TOPICAL | Status: DC | PRN
  Administered 2022-03-11: 1 via TOPICAL

## 2022-03-11 MED ORDER — ALUM & MAG HYDROXIDE-SIMETH 200-200-20 MG/5ML PO SUSP: 30.0000 mL | Freq: Four times a day (QID) | ORAL | Status: DC | PRN

## 2022-03-11 MED ORDER — THROMBIN 20000 UNITS EX SOLR: CUTANEOUS | Status: DC | PRN

## 2022-03-11 MED ORDER — PHENYLEPHRINE 80 MCG/ML (10ML) SYRINGE FOR IV PUSH (FOR BLOOD PRESSURE SUPPORT)
PREFILLED_SYRINGE | INTRAVENOUS | Status: AC
  Filled 2022-03-11: qty 20

## 2022-03-11 MED ORDER — SODIUM CHLORIDE 0.9% IV SOLUTION: Freq: Once | INTRAVENOUS | Status: AC

## 2022-03-11 MED ORDER — FENTANYL CITRATE (PF) 100 MCG/2ML IJ SOLN
INTRAMUSCULAR | Status: AC
  Filled 2022-03-11: qty 2

## 2022-03-11 MED ORDER — MENTHOL 3 MG MT LOZG: 1.0000 | LOZENGE | OROMUCOSAL | Status: DC | PRN

## 2022-03-11 MED ORDER — MORPHINE SULFATE (PF) 2 MG/ML IV SOLN
2.0000 mg | INTRAVENOUS | Status: DC | PRN
  Administered 2022-03-12 – 2022-03-16 (×8): 2 mg via INTRAVENOUS
  Filled 2022-03-11 (×9): qty 1

## 2022-03-11 MED ORDER — LACTATED RINGERS IV SOLN: INTRAVENOUS | Status: DC

## 2022-03-11 MED ORDER — GLIMEPIRIDE 4 MG PO TABS
4.0000 mg | ORAL_TABLET | Freq: Every day | ORAL | Status: DC
  Administered 2022-03-12: 4 mg via ORAL
  Filled 2022-03-11: qty 1

## 2022-03-11 MED ORDER — SODIUM CHLORIDE 0.9% FLUSH
3.0000 mL | Freq: Two times a day (BID) | INTRAVENOUS | Status: DC
  Administered 2022-03-11 – 2022-03-23 (×21): 3 mL via INTRAVENOUS

## 2022-03-11 MED ORDER — ARTIFICIAL TEARS OPHTHALMIC OINT
TOPICAL_OINTMENT | OPHTHALMIC | Status: AC
  Filled 2022-03-11: qty 3.5

## 2022-03-11 MED ORDER — LIDOCAINE 2% (20 MG/ML) 5 ML SYRINGE
INTRAMUSCULAR | Status: DC | PRN
  Administered 2022-03-11: 100 mg via INTRAVENOUS

## 2022-03-11 MED ORDER — ROCURONIUM BROMIDE 100 MG/10ML IV SOLN
INTRAVENOUS | Status: DC | PRN
  Administered 2022-03-11: 50 mg via INTRAVENOUS
  Administered 2022-03-11: 20 mg via INTRAVENOUS

## 2022-03-11 MED ORDER — SUGAMMADEX SODIUM 200 MG/2ML IV SOLN
INTRAVENOUS | Status: DC | PRN
  Administered 2022-03-11: 200 mg via INTRAVENOUS

## 2022-03-11 MED ORDER — SODIUM CHLORIDE 0.9 % IV SOLN: INTRAVENOUS | Status: DC | PRN

## 2022-03-11 MED ORDER — PROPOFOL 10 MG/ML IV BOLUS
INTRAVENOUS | Status: DC | PRN
  Administered 2022-03-11: 140 mg via INTRAVENOUS

## 2022-03-11 MED ORDER — BUPIVACAINE HCL (PF) 0.5 % IJ SOLN
INTRAMUSCULAR | Status: DC | PRN
  Administered 2022-03-11: 4 mL

## 2022-03-11 MED ORDER — 0.9 % SODIUM CHLORIDE (POUR BTL) OPTIME
TOPICAL | Status: DC | PRN
  Administered 2022-03-11 (×2): 1000 mL

## 2022-03-11 MED ORDER — CARVEDILOL 3.125 MG PO TABS
1.5625 mg | ORAL_TABLET | Freq: Two times a day (BID) | ORAL | Status: DC
  Administered 2022-03-12: 1.5625 mg via ORAL
  Filled 2022-03-11: qty 1

## 2022-03-11 MED ORDER — CEFAZOLIN SODIUM-DEXTROSE 2-4 GM/100ML-% IV SOLN
2.0000 g | INTRAVENOUS | Status: AC
  Administered 2022-03-11 (×2): 2 g via INTRAVENOUS

## 2022-03-11 MED ORDER — CEFAZOLIN SODIUM-DEXTROSE 2-3 GM-%(50ML) IV SOLR
INTRAVENOUS | Status: DC | PRN
  Administered 2022-03-11: 2 g via INTRAVENOUS

## 2022-03-11 MED ORDER — ACETAMINOPHEN 650 MG RE SUPP: 650.0000 mg | RECTAL | Status: DC | PRN

## 2022-03-11 MED ORDER — CEFAZOLIN SODIUM 1 G IJ SOLR
INTRAMUSCULAR | Status: AC
  Filled 2022-03-11: qty 20

## 2022-03-11 MED ORDER — LIDOCAINE-EPINEPHRINE 1 %-1:100000 IJ SOLN
INTRAMUSCULAR | Status: AC
  Filled 2022-03-11: qty 1

## 2022-03-11 MED ORDER — ROCURONIUM BROMIDE 10 MG/ML (PF) SYRINGE
PREFILLED_SYRINGE | INTRAVENOUS | Status: AC
  Filled 2022-03-11: qty 10

## 2022-03-11 MED ORDER — ACETAMINOPHEN 10 MG/ML IV SOLN: 1000.0000 mg | Freq: Once | INTRAVENOUS | Status: DC | PRN

## 2022-03-11 MED ORDER — SODIUM CHLORIDE 0.9% FLUSH: 3.0000 mL | Freq: Two times a day (BID) | INTRAVENOUS | Status: DC

## 2022-03-11 MED ORDER — EPHEDRINE SULFATE-NACL 50-0.9 MG/10ML-% IV SOSY
PREFILLED_SYRINGE | INTRAVENOUS | Status: DC | PRN
  Administered 2022-03-11: 5 mg via INTRAVENOUS
  Administered 2022-03-11: 2.5 mg via INTRAVENOUS
  Administered 2022-03-11: 5 mg via INTRAVENOUS
  Administered 2022-03-11: 2.5 mg via INTRAVENOUS

## 2022-03-11 MED ORDER — BUPIVACAINE HCL (PF) 0.5 % IJ SOLN
INTRAMUSCULAR | Status: AC
  Filled 2022-03-11: qty 30

## 2022-03-11 MED ORDER — ALBUMIN HUMAN 5 % IV SOLN: INTRAVENOUS | Status: DC | PRN

## 2022-03-11 MED ORDER — SUCCINYLCHOLINE CHLORIDE 200 MG/10ML IV SOSY
PREFILLED_SYRINGE | INTRAVENOUS | Status: AC
Start: 2022-03-11 — End: ?
  Filled 2022-03-11: qty 10

## 2022-03-11 MED ORDER — ACETAMINOPHEN 325 MG PO TABS: 650.0000 mg | ORAL_TABLET | ORAL | Status: DC | PRN

## 2022-03-11 MED ORDER — HYDROMORPHONE HCL 1 MG/ML IJ SOLN: 0.2500 mg | INTRAMUSCULAR | Status: DC | PRN

## 2022-03-11 MED ORDER — EPHEDRINE 5 MG/ML INJ
INTRAVENOUS | Status: AC
  Filled 2022-03-11: qty 5

## 2022-03-11 MED ORDER — CHLORHEXIDINE GLUCONATE 0.12 % MT SOLN: 15.0000 mL | Freq: Once | OROMUCOSAL | Status: AC

## 2022-03-11 MED ORDER — GABAPENTIN 100 MG PO CAPS: 100.0000 mg | ORAL_CAPSULE | Freq: Every day | ORAL | Status: DC

## 2022-03-11 MED ORDER — ONDANSETRON HCL 4 MG/2ML IJ SOLN
INTRAMUSCULAR | Status: AC
  Filled 2022-03-11: qty 2

## 2022-03-11 MED ORDER — METHOCARBAMOL 1000 MG/10ML IJ SOLN
500.0000 mg | Freq: Four times a day (QID) | INTRAMUSCULAR | Status: DC | PRN
Start: 2022-03-11 — End: 2022-03-12
  Administered 2022-03-12: 500 mg via INTRAVENOUS
  Filled 2022-03-11: qty 500

## 2022-03-11 MED ORDER — POTASSIUM CHLORIDE CRYS ER 10 MEQ PO TBCR: 10.0000 meq | EXTENDED_RELEASE_TABLET | Freq: Every day | ORAL | Status: DC

## 2022-03-11 MED ORDER — THROMBIN 5000 UNITS EX SOLR
CUTANEOUS | Status: AC
Start: 2022-03-11 — End: ?
  Filled 2022-03-11: qty 5000

## 2022-03-11 MED ORDER — FENTANYL CITRATE (PF) 250 MCG/5ML IJ SOLN
INTRAMUSCULAR | Status: DC | PRN
  Administered 2022-03-11: 100 ug via INTRAVENOUS
  Administered 2022-03-11 (×2): 50 ug via INTRAVENOUS

## 2022-03-11 MED ORDER — TRANEXAMIC ACID-NACL 1000-0.7 MG/100ML-% IV SOLN
INTRAVENOUS | Status: DC | PRN
  Administered 2022-03-11: 1000 mg via INTRAVENOUS

## 2022-03-11 MED ORDER — ACETAMINOPHEN 500 MG PO TABS: 1000.0000 mg | ORAL_TABLET | Freq: Once | ORAL | Status: AC

## 2022-03-11 MED ORDER — CEFAZOLIN SODIUM-DEXTROSE 2-4 GM/100ML-% IV SOLN
INTRAVENOUS | Status: AC
  Filled 2022-03-11: qty 100

## 2022-03-11 MED ORDER — CHLORHEXIDINE GLUCONATE 0.12 % MT SOLN
OROMUCOSAL | Status: AC
  Administered 2022-03-11: 15 mL via OROMUCOSAL
  Filled 2022-03-11: qty 15

## 2022-03-11 MED ORDER — LEVOTHYROXINE SODIUM 88 MCG PO TABS
88.0000 ug | ORAL_TABLET | Freq: Every day | ORAL | Status: DC
  Administered 2022-03-12: 88 ug via ORAL
  Filled 2022-03-11: qty 1

## 2022-03-11 MED ORDER — CHLORHEXIDINE GLUCONATE CLOTH 2 % EX PADS
6.0000 | MEDICATED_PAD | Freq: Every day | CUTANEOUS | Status: DC
  Administered 2022-03-12 – 2022-03-22 (×11): 6 via TOPICAL

## 2022-03-11 SURGICAL SUPPLY — 49 items
ADH SKN CLS APL DERMABOND .7 (GAUZE/BANDAGES/DRESSINGS) ×2
APL SKNCLS STERI-STRIP NONHPOA (GAUZE/BANDAGES/DRESSINGS) ×2
BAG COUNTER SPONGE SURGICOUNT (BAG) ×6 IMPLANT
BAG SPNG CNTER NS LX DISP (BAG) ×4
BAND INSRT 18 STRL LF DISP RB (MISCELLANEOUS) ×4
BAND RUBBER #18 3X1/16 STRL (MISCELLANEOUS) ×4 IMPLANT
BASKET BONE COLLECTION (BASKET) ×2 IMPLANT
BENZOIN TINCTURE PRP APPL 2/3 (GAUZE/BANDAGES/DRESSINGS) ×2 IMPLANT
BIT DRILL NEURO 2X3.1 SFT TUCH (MISCELLANEOUS) ×3 IMPLANT
BUR BARREL STRAIGHT FLUTE 4.0 (BURR) ×2 IMPLANT
CAGE MINI XCORE 22-33 14 (Cage) ×2 IMPLANT
CANISTER SUCT 3000ML PPV (MISCELLANEOUS) ×4 IMPLANT
DERMABOND ADVANCED (GAUZE/BANDAGES/DRESSINGS) ×1
DERMABOND ADVANCED .7 DNX12 (GAUZE/BANDAGES/DRESSINGS) ×3 IMPLANT
DRAPE LAPAROTOMY 100X72 PEDS (DRAPES) ×4 IMPLANT
DRAPE MICROSCOPE LEICA (MISCELLANEOUS) ×2 IMPLANT
DRILL NEURO 2X3.1 SOFT TOUCH (MISCELLANEOUS) ×3
DURAPREP 6ML APPLICATOR 50/CS (WOUND CARE) ×4 IMPLANT
ELECT COATED BLADE 2.86 ST (ELECTRODE) ×4 IMPLANT
ELECT REM PT RETURN 9FT ADLT (ELECTROSURGICAL) ×3
ELECTRODE REM PT RTRN 9FT ADLT (ELECTROSURGICAL) ×3 IMPLANT
GAUZE SPONGE 4X4 12PLY STRL (GAUZE/BANDAGES/DRESSINGS) ×2 IMPLANT
GLOVE BIOGEL PI IND STRL 8.5 (GLOVE) ×4 IMPLANT
GLOVE BIOGEL PI INDICATOR 8.5 (GLOVE) ×2
GLOVE ECLIPSE 8.5 STRL (GLOVE) ×6 IMPLANT
GOWN STRL REUS W/ TWL LRG LVL3 (GOWN DISPOSABLE) IMPLANT
GOWN STRL REUS W/ TWL XL LVL3 (GOWN DISPOSABLE) ×3 IMPLANT
GOWN STRL REUS W/TWL 2XL LVL3 (GOWN DISPOSABLE) ×4 IMPLANT
GOWN STRL REUS W/TWL LRG LVL3 (GOWN DISPOSABLE)
GOWN STRL REUS W/TWL XL LVL3 (GOWN DISPOSABLE) ×3
HALTER HD/CHIN CERV TRACTION D (MISCELLANEOUS) ×4 IMPLANT
HEMOSTAT POWDER KIT SURGIFOAM (HEMOSTASIS) ×6 IMPLANT
KIT BASIN OR (CUSTOM PROCEDURE TRAY) ×4 IMPLANT
NDL SPNL 22GX3.5 QUINCKE BK (NEEDLE) ×2 IMPLANT
NEEDLE HYPO 22GX1.5 SAFETY (NEEDLE) ×4 IMPLANT
NEEDLE SPNL 22GX3.5 QUINCKE BK (NEEDLE) ×3 IMPLANT
NS IRRIG 1000ML POUR BTL (IV SOLUTION) ×4 IMPLANT
PACK LAMINECTOMY NEURO (CUSTOM PROCEDURE TRAY) ×4 IMPLANT
PAD ARMBOARD 7.5X6 YLW CONV (MISCELLANEOUS) ×12 IMPLANT
PATTIES SURGICAL .5 X1 (DISPOSABLE) ×4 IMPLANT
PLATE ACP 1.9X44 2L NS (Plate) ×2 IMPLANT
SPONGE INTESTINAL PEANUT (DISPOSABLE) ×4 IMPLANT
SPONGE SURGIFOAM ABS GEL 100 (HEMOSTASIS) ×2 IMPLANT
STRIP CLOSURE SKIN 1/2X4 (GAUZE/BANDAGES/DRESSINGS) ×2 IMPLANT
SUT VIC AB 4-0 RB1 18 (SUTURE) ×6 IMPLANT
TAPE CLOTH SURG 4X10 WHT LF (GAUZE/BANDAGES/DRESSINGS) ×2 IMPLANT
TOWEL GREEN STERILE (TOWEL DISPOSABLE) ×4 IMPLANT
TOWEL GREEN STERILE FF (TOWEL DISPOSABLE) ×4 IMPLANT
WATER STERILE IRR 1000ML POUR (IV SOLUTION) ×4 IMPLANT

## 2022-03-11 SURGICAL SUPPLY — 63 items
ADH SKN CLS APL DERMABOND .7 (GAUZE/BANDAGES/DRESSINGS) ×1
APL SKNCLS STERI-STRIP NONHPOA (GAUZE/BANDAGES/DRESSINGS) ×1
BAG COUNTER SPONGE SURGICOUNT (BAG) ×4 IMPLANT
BAG SPNG CNTER NS LX DISP (BAG) ×2
BAND INSRT 18 STRL LF DISP RB (MISCELLANEOUS)
BAND RUBBER #18 3X1/16 STRL (MISCELLANEOUS) IMPLANT
BENZOIN TINCTURE PRP APPL 2/3 (GAUZE/BANDAGES/DRESSINGS) ×1 IMPLANT
BIT DRILL ACP 15 (DRILL) IMPLANT
BIT DRILL NEURO 2X3.1 SFT TUCH (MISCELLANEOUS) ×2 IMPLANT
BNDG GAUZE ELAST 4 BULKY (GAUZE/BANDAGES/DRESSINGS) IMPLANT
BUR BARREL STRAIGHT FLUTE 4.0 (BURR) ×3 IMPLANT
BUR ROUND FLUTED 4 SOFT TCH (BURR) ×1 IMPLANT
CAGE MINI X-CORE 15-20 14 (Cage) ×1 IMPLANT
CANISTER SUCT 3000ML PPV (MISCELLANEOUS) ×3 IMPLANT
CAP END 16X14 SPINAL PL XCORE (Cap) IMPLANT
DERMABOND ADVANCED (GAUZE/BANDAGES/DRESSINGS) ×1
DERMABOND ADVANCED .7 DNX12 (GAUZE/BANDAGES/DRESSINGS) ×2 IMPLANT
DRAPE LAPAROTOMY 100X72 PEDS (DRAPES) ×3 IMPLANT
DRAPE MICROSCOPE LEICA (MISCELLANEOUS) IMPLANT
DRILL ACP 15 (DRILL) ×2
DRILL NEURO 2X3.1 SOFT TOUCH (MISCELLANEOUS) ×2
DURAPREP 6ML APPLICATOR 50/CS (WOUND CARE) ×3 IMPLANT
ELECT COATED BLADE 2.86 ST (ELECTRODE) ×3 IMPLANT
ELECT REM PT RETURN 9FT ADLT (ELECTROSURGICAL) ×2
ELECTRODE REM PT RTRN 9FT ADLT (ELECTROSURGICAL) ×2 IMPLANT
ENDCAP SPINAL PL XCORE 16X14 (Cap) ×2 IMPLANT
GAUZE 4X4 16PLY ~~LOC~~+RFID DBL (SPONGE) IMPLANT
GAUZE SPONGE 4X4 12PLY STRL (GAUZE/BANDAGES/DRESSINGS) ×1 IMPLANT
GLOVE BIOGEL PI IND STRL 8.5 (GLOVE) ×2 IMPLANT
GLOVE BIOGEL PI INDICATOR 8.5 (GLOVE) ×1
GLOVE ECLIPSE 8.5 STRL (GLOVE) ×3 IMPLANT
GOWN STRL REUS W/ TWL LRG LVL3 (GOWN DISPOSABLE) IMPLANT
GOWN STRL REUS W/ TWL XL LVL3 (GOWN DISPOSABLE) ×2 IMPLANT
GOWN STRL REUS W/TWL 2XL LVL3 (GOWN DISPOSABLE) ×3 IMPLANT
GOWN STRL REUS W/TWL LRG LVL3 (GOWN DISPOSABLE)
GOWN STRL REUS W/TWL XL LVL3 (GOWN DISPOSABLE) ×2
HALTER HD/CHIN CERV TRACTION D (MISCELLANEOUS) ×3 IMPLANT
HEMOSTAT POWDER KIT SURGIFOAM (HEMOSTASIS) ×6 IMPLANT
KIT BASIN OR (CUSTOM PROCEDURE TRAY) ×3 IMPLANT
KIT TURNOVER KIT B (KITS) ×3 IMPLANT
NDL SPNL 22GX3.5 QUINCKE BK (NEEDLE) ×2 IMPLANT
NEEDLE HYPO 22GX1.5 SAFETY (NEEDLE) ×3 IMPLANT
NEEDLE SPNL 22GX3.5 QUINCKE BK (NEEDLE) ×2 IMPLANT
NS IRRIG 1000ML POUR BTL (IV SOLUTION) ×4 IMPLANT
PACK LAMINECTOMY NEURO (CUSTOM PROCEDURE TRAY) ×3 IMPLANT
PAD ARMBOARD 7.5X6 YLW CONV (MISCELLANEOUS) ×9 IMPLANT
PATTIES SURGICAL .5 X1 (DISPOSABLE) ×2 IMPLANT
PATTIES SURGICAL 1X1 (DISPOSABLE) ×2 IMPLANT
PLATE ACP 1.6X38 2LVL (Plate) ×1 IMPLANT
SCREW ACP 3.5X17 (Screw) ×4 IMPLANT
SCREW SET END CAP (Screw) ×2 IMPLANT
SEALANT ADHERUS EXTEND TIP (MISCELLANEOUS) ×1 IMPLANT
SET WALTER ACTIVATION W/DRAPE (SET/KITS/TRAYS/PACK) ×3 IMPLANT
SPIKE FLUID TRANSFER (MISCELLANEOUS) ×2 IMPLANT
SPONGE INTESTINAL PEANUT (DISPOSABLE) ×3 IMPLANT
STRIP CLOSURE SKIN 1/2X4 (GAUZE/BANDAGES/DRESSINGS) ×1 IMPLANT
SUT VIC AB 3-0 SH 8-18 (SUTURE) ×1 IMPLANT
SUT VIC AB 4-0 RB1 18 (SUTURE) ×6 IMPLANT
TAPE CLOTH SURG 4X10 WHT LF (GAUZE/BANDAGES/DRESSINGS) ×1 IMPLANT
TOWEL GREEN STERILE (TOWEL DISPOSABLE) ×3 IMPLANT
TOWEL GREEN STERILE FF (TOWEL DISPOSABLE) ×3 IMPLANT
TRAY FOLEY MTR SLVR 16FR STAT (SET/KITS/TRAYS/PACK) ×1 IMPLANT
WATER STERILE IRR 1000ML POUR (IV SOLUTION) ×3 IMPLANT

## 2022-03-11 NOTE — Anesthesia Procedure Notes (Signed)
Procedure Name: Intubation Date/Time: 03/11/2022 12:48 PM  Performed by: Janene Harvey, CRNAPre-anesthesia Checklist: Patient identified, Emergency Drugs available, Suction available and Patient being monitored Patient Re-evaluated:Patient Re-evaluated prior to induction Oxygen Delivery Method: Circle system utilized Preoxygenation: Pre-oxygenation with 100% oxygen Induction Type: IV induction Ventilation: Mask ventilation without difficulty and Oral airway inserted - appropriate to patient size Laryngoscope Size: Glidescope and 4 Grade View: Grade I Tube type: Oral Tube size: 7.5 mm Number of attempts: 1 Airway Equipment and Method: Stylet and Oral airway Placement Confirmation: ETT inserted through vocal cords under direct vision, positive ETCO2 and breath sounds checked- equal and bilateral Secured at: 22 cm Tube secured with: Tape Dental Injury: Teeth and Oropharynx as per pre-operative assessment  Comments: Elective glidescope - limited ROM on preop exam

## 2022-03-11 NOTE — Transfer of Care (Signed)
Immediate Anesthesia Transfer of Care Note  Patient: [name]  Procedure(s) Performed: Cervical corpectomy Cervical Four with reconstruction using titanium spacer, anterior plate fixation Cervical Four to Cervical Six  Patient Location: PACU  Anesthesia Type:General  Level of Consciousness: awake and patient cooperative  Airway & Oxygen Therapy: Patient Spontanous Breathing and Patient connected to face mask oxygen  Post-op Assessment: Report given to RN, Post -op Vital signs reviewed and stable and Patient moving all extremities X 4  Post vital signs: Reviewed and stable  Last Vitals:  Vitals Value Taken Time  BP 172/93 03/11/22 1719  Temp    Pulse 85 03/11/22 1722  Resp 18 03/11/22 1722  SpO2 100 % 03/11/22 1722  Vitals shown include unvalidated device data.  Last Pain:  Vitals:   03/11/22 0922  TempSrc:   PainSc: 0-No pain         Complications: No notable events documented.

## 2022-03-11 NOTE — H&P (Signed)
Chief complaint: Quadriparesis  History of present illness:Patient is a 76 year old male who is a previous patient of mine.  He's had severe cervical myelopathy at the level of C3-C4 and underwent decompression and fusion with recovery of function years ago.  Patient has had progressive weakness in his upper extremities and is now found to have severe spondylitic stenosis at C4-5 and C5-6 with large osteophytes pressing into his spinal cord creating spinal cord signal change.  He's been advised regarding the need for surgical decompression is now admitted to the hospital for this process. Patient notes that his weakness is greater on the left side.  He's had an extensive workup including an MRI of the brain to look for stroke she's had a CT angiogram of the intracranial vessels demonstrates patient has significant cord compression on the left side at the level of C4-C5 with large bony osteophyte and ossification of posterior longitudinal ligament which extends only down to level of C6.  Past medical history: Patient has a history of diabetes and hypertension.  He's had also issues of a chronic cough.  Review of systems: Patient has a balance disturbance chronic cough painful urination difficulty starting and stopping urinary stream leg weakness back pain level and blurred vision or motor on a 14 point review of systems. Physical exam: On exam the patient has significant weakness in his lower extremities such that he walks with the use of a 3 wheeled walker to maintain balance weakness of the left side nose at the upper extremity has strength graded at 4 minus out of 5 and the distal upper extremity and 305 in the proximal upper extremity and the deltoid bicep and tricep.  He notes that he is weakness in the left lower extremity such that he has to flex from his hip on the right side in order to lift his left leg which drags forward.  He notes that he cannot bear full weight on his left leg without maintaining  some additional bounds using his upper extremities.  On exam of his upper extremities strength on the right is graded at 4 out of 5 in the deltoid and bicep and tricep  On the left is graded 3 minus out of 5.  Tone and bulk are somewhat decreased and tone is increased in the left upper extremity.  Cranial nerve examination is within the limits of normal.  General physical exam reveals a lungs are clear to auscultation heart has regular rate and rhythm abdomen is soft bowel sounds positive no masses are noted extremities reveal no cyanosis clubbing or edema.  Impression: Patient has evidence of severe spondylytic myelopathy at C4-5 and C5-6.  After careful consideration of his options noting that he's had progressive weakness discuss surgical decompression via an anterior approach dual corpectomy level of C5 and decompressed C4-5 And C5-6.

## 2022-03-11 NOTE — Transfer of Care (Signed)
Immediate Anesthesia Transfer of Care Note  Patient: [name]  Procedure(s) Performed: EVACUATION OF ANTERIOR CERVICAL HEMATOMA (Spine Cervical)  Patient Location: PACU  Anesthesia Type:General  Level of Consciousness: drowsy  Airway & Oxygen Therapy: Patient Spontanous Breathing  Post-op Assessment: Report given to RN and Post -op Vital signs reviewed and stable  Post vital signs: Reviewed and stable  Last Vitals:  Vitals Value Taken Time  BP    Temp    Pulse 87 03/11/22 2211  Resp    SpO2 100 % 03/11/22 2211  Vitals shown include unvalidated device data.  Last Pain:  Vitals:   03/11/22 1930  TempSrc:   PainSc: Asleep         Complications: No notable events documented.

## 2022-03-11 NOTE — Anesthesia Procedure Notes (Signed)
Procedure Name: Intubation Date/Time: 03/11/2022 8:14 PM  Performed by: Clovis Cao, CRNAPre-anesthesia Checklist: Patient identified, Emergency Drugs available, Suction available and Patient being monitored Patient Re-evaluated:Patient Re-evaluated prior to induction Oxygen Delivery Method: Circle system utilized Preoxygenation: Pre-oxygenation with 100% oxygen Induction Type: IV induction, Rapid sequence and Cricoid Pressure applied Laryngoscope Size: Glidescope and 4 Grade View: Grade I Tube type: Oral Number of attempts: 1 Airway Equipment and Method: Stylet and Video-laryngoscopy Placement Confirmation: ETT inserted through vocal cords under direct vision, positive ETCO2 and breath sounds checked- equal and bilateral Secured at: 22 cm Tube secured with: Tape Dental Injury: Teeth and Oropharynx as per pre-operative assessment

## 2022-03-11 NOTE — Anesthesia Postprocedure Evaluation (Signed)
Anesthesia Post Note  Patient: [name]  Procedure(s) Performed: Cervical corpectomy Cervical Four with reconstruction using titanium spacer, anterior plate fixation Cervical Four to Cervical Six     Patient location during evaluation: PACU Anesthesia Type: General Level of consciousness: awake and alert, patient cooperative and oriented Pain management: pain level controlled Vital Signs Assessment: post-procedure vital signs reviewed and stable Respiratory status: spontaneous breathing, nonlabored ventilation, respiratory function stable and patient connected to nasal cannula oxygen Cardiovascular status: blood pressure returned to baseline and stable Postop Assessment: no apparent nausea or vomiting Anesthetic complications: no Comments: Called to PACU for patient not moving L arm or leg, arrived to find pt able to lift L arm, squeeze my hand, bend at L knee and wiggle toes. PACU to obtain CT neck as per Dr. Ellene Route   No notable events documented.  Last Vitals:  Vitals:   03/11/22 1735 03/11/22 1750  BP: (!) 151/83 (!) 158/83  Pulse: 83 81  Resp: 19 20  Temp:    SpO2: 96% 96%    Last Pain:  Vitals:   03/11/22 0922  TempSrc:   PainSc: 0-No pain                 Avamae Dehaan,E. Artyom Stencel

## 2022-03-11 NOTE — Anesthesia Preprocedure Evaluation (Addendum)
Anesthesia Evaluation  Patient identified by MRN, date of birth, ID band Patient awake    Reviewed: Allergy & Precautions, H&P , NPO status , Patient's Chart, lab work & pertinent test results, reviewed documented beta blocker date and time   History of Anesthesia Complications (+) PONV and history of anesthetic complications  Airway Mallampati: III  TM Distance: >3 FB Neck ROM: Limited    Dental no notable dental hx. (+) Edentulous Upper, Edentulous Lower, Dental Advisory Given   Pulmonary sleep apnea , former smoker,    Pulmonary exam normal breath sounds clear to auscultation       Cardiovascular hypertension, Pt. on medications and Pt. on home beta blockers + Peripheral Vascular Disease and +CHF   Rhythm:Regular Rate:Normal     Neuro/Psych CVA negative psych ROS   GI/Hepatic Neg liver ROS, GERD  Medicated,  Endo/Other  diabetes, Type 2, Oral Hypoglycemic AgentsHypothyroidism   Renal/GU negative Renal ROS  negative genitourinary   Musculoskeletal  (+) Arthritis , Osteoarthritis,    Abdominal   Peds  Hematology  (+) Blood dyscrasia, anemia ,   Anesthesia Other Findings   Reproductive/Obstetrics negative OB ROS                            Anesthesia Physical Anesthesia Plan  ASA: 3 and emergent  Anesthesia Plan: General   Post-op Pain Management:    Induction: Intravenous  PONV Risk Score and Plan: 4 or greater and Treatment may vary due to age or medical condition  Airway Management Planned: Oral ETT and Video Laryngoscope Planned  Additional Equipment:   Intra-op Plan:   Post-operative Plan: Extubation in OR  Informed Consent: I have reviewed the patients History and Physical, chart, labs and discussed the procedure including the risks, benefits and alternatives for the proposed anesthesia with the patient or authorized representative who has indicated his/her understanding  and acceptance.     Dental advisory given  Plan Discussed with: CRNA  Anesthesia Plan Comments:        Anesthesia Quick Evaluation

## 2022-03-12 ENCOUNTER — Inpatient Hospital Stay (HOSPITAL_COMMUNITY): Payer: HMO

## 2022-03-12 ENCOUNTER — Ambulatory Visit (HOSPITAL_COMMUNITY): Payer: HMO

## 2022-03-12 ENCOUNTER — Other Ambulatory Visit: Payer: Self-pay

## 2022-03-12 LAB — BASIC METABOLIC PANEL
Anion gap: 11 (ref 5–15)
BUN: 14 mg/dL (ref 8–23)
CO2: 24 mmol/L (ref 22–32)
Calcium: 8.5 mg/dL — ABNORMAL LOW (ref 8.9–10.3)
Chloride: 102 mmol/L (ref 98–111)
Creatinine, Ser: 0.79 mg/dL (ref 0.61–1.24)
GFR, Estimated: >60 mL/min (ref >60)
Glucose, Bld: 245 mg/dL — ABNORMAL HIGH (ref 70–99)
Potassium: 4 mmol/L (ref 3.5–5.1)
Sodium: 137 mmol/L (ref 135–145)

## 2022-03-12 LAB — PREPARE PLATELET PHERESIS
Unit division: 0
Unit division: 0

## 2022-03-12 LAB — GLUCOSE, CAPILLARY
Glucose-Capillary: 261 mg/dL — ABNORMAL HIGH (ref 70–99)
Glucose-Capillary: 194 mg/dL — ABNORMAL HIGH (ref 70–99)
Glucose-Capillary: 161 mg/dL — ABNORMAL HIGH (ref 70–99)
Glucose-Capillary: 108 mg/dL — ABNORMAL HIGH (ref 70–99)
Glucose-Capillary: 112 mg/dL — ABNORMAL HIGH (ref 70–99)

## 2022-03-12 LAB — CBC
HCT: 30.9 % — ABNORMAL LOW (ref 39.0–52.0)
Hemoglobin: 10.3 g/dL — ABNORMAL LOW (ref 13.0–17.0)
MCH: 33.2 pg (ref 26.0–34.0)
MCHC: 33.3 g/dL (ref 30.0–36.0)
MCV: 99.7 fL (ref 80.0–100.0)
Platelets: 177 10*3/uL (ref 150–400)
RBC: 3.1 MIL/uL — ABNORMAL LOW (ref 4.22–5.81)
RDW: 17 % — ABNORMAL HIGH (ref 11.5–15.5)
WBC: 9.8 10*3/uL (ref 4.0–10.5)
nRBC: 0 % (ref 0.0–0.2)

## 2022-03-12 LAB — BPAM PLATELET PHERESIS
Blood Product Expiration Date: 202306252359
Blood Product Expiration Date: 202306252359
ISSUE DATE / TIME: 202306222025
ISSUE DATE / TIME: 202306222025
Unit Type and Rh: 6200
Unit Type and Rh: 6200

## 2022-03-12 LAB — MRSA NEXT GEN BY PCR, NASAL: MRSA by PCR Next Gen: NOT DETECTED

## 2022-03-12 MED ORDER — ACETAMINOPHEN 325 MG PO TABS
650.0000 mg | ORAL_TABLET | ORAL | Status: DC | PRN
  Administered 2022-03-16 – 2022-03-22 (×8): 650 mg
  Filled 2022-03-12 (×8): qty 2

## 2022-03-12 MED ORDER — DOCUSATE SODIUM 50 MG/5ML PO LIQD
100.0000 mg | Freq: Two times a day (BID) | ORAL | Status: DC
  Administered 2022-03-16 – 2022-03-19 (×6): 100 mg
  Filled 2022-03-12 (×7): qty 10

## 2022-03-12 MED ORDER — POLYETHYLENE GLYCOL 3350 17 G PO PACK: 17.0000 g | PACK | Freq: Every day | ORAL | Status: DC | PRN

## 2022-03-12 MED ORDER — METHOCARBAMOL 500 MG PO TABS: 500.0000 mg | ORAL_TABLET | Freq: Four times a day (QID) | ORAL | Status: DC | PRN

## 2022-03-12 MED ORDER — ORAL CARE MOUTH RINSE: 15.0000 mL | OROMUCOSAL | Status: DC | PRN

## 2022-03-12 MED ORDER — FUROSEMIDE 40 MG PO TABS
40.0000 mg | ORAL_TABLET | Freq: Two times a day (BID) | ORAL | Status: DC
  Filled 2022-03-12: qty 1

## 2022-03-12 MED ORDER — SENNA 8.6 MG PO TABS
1.0000 | ORAL_TABLET | Freq: Two times a day (BID) | ORAL | Status: DC
  Administered 2022-03-16: 8.6 mg
  Filled 2022-03-12 (×2): qty 1

## 2022-03-12 MED ORDER — GABAPENTIN 250 MG/5ML PO SOLN
100.0000 mg | Freq: Every day | ORAL | Status: DC
  Administered 2022-03-16: 100 mg
  Filled 2022-03-12 (×5): qty 2

## 2022-03-12 MED ORDER — ACETAMINOPHEN 650 MG RE SUPP: 650.0000 mg | RECTAL | Status: DC | PRN

## 2022-03-12 MED ORDER — GLIMEPIRIDE 4 MG PO TABS
4.0000 mg | ORAL_TABLET | Freq: Every day | ORAL | Status: DC
Start: 2022-03-13 — End: 2022-03-17
  Administered 2022-03-17: 4 mg
  Filled 2022-03-12: qty 2
  Filled 2022-03-12 (×3): qty 1

## 2022-03-12 MED ORDER — GABAPENTIN 100 MG PO CAPS: 100.0000 mg | ORAL_CAPSULE | Freq: Every day | ORAL | Status: DC

## 2022-03-12 MED ORDER — POTASSIUM CHLORIDE 20 MEQ PO PACK
20.0000 meq | PACK | Freq: Every day | ORAL | Status: DC
  Administered 2022-03-17: 20 meq
  Filled 2022-03-12: qty 1

## 2022-03-12 MED ORDER — ROPINIROLE HCL 0.5 MG PO TABS
0.5000 mg | ORAL_TABLET | Freq: Three times a day (TID) | ORAL | Status: DC
Start: 2022-03-12 — End: 2022-03-17
  Administered 2022-03-16 – 2022-03-17 (×3): 0.5 mg
  Filled 2022-03-12 (×12): qty 1

## 2022-03-12 MED ORDER — PANTOPRAZOLE 2 MG/ML SUSPENSION
40.0000 mg | Freq: Every day | ORAL | Status: DC
Start: 2022-03-12 — End: 2022-03-16

## 2022-03-12 MED ORDER — ROSUVASTATIN CALCIUM 20 MG PO TABS
20.0000 mg | ORAL_TABLET | Freq: Every day | ORAL | Status: DC
  Administered 2022-03-17: 20 mg
  Filled 2022-03-12: qty 1

## 2022-03-12 MED ORDER — ALUM & MAG HYDROXIDE-SIMETH 200-200-20 MG/5ML PO SUSP: 30.0000 mL | Freq: Four times a day (QID) | ORAL | Status: DC | PRN

## 2022-03-12 MED ORDER — CARVEDILOL 3.125 MG PO TABS
1.5625 mg | ORAL_TABLET | Freq: Two times a day (BID) | ORAL | Status: DC
  Filled 2022-03-12: qty 1

## 2022-03-12 MED ORDER — INSULIN ASPART 100 UNIT/ML IJ SOLN
0.0000 [IU] | Freq: Three times a day (TID) | INTRAMUSCULAR | Status: DC
  Administered 2022-03-12: 3 [IU] via SUBCUTANEOUS
  Administered 2022-03-13: 2 [IU] via SUBCUTANEOUS
  Administered 2022-03-13: 3 [IU] via SUBCUTANEOUS
  Administered 2022-03-13: 2 [IU] via SUBCUTANEOUS
  Administered 2022-03-14: 5 [IU] via SUBCUTANEOUS
  Administered 2022-03-14: 3 [IU] via SUBCUTANEOUS
  Administered 2022-03-14: 5 [IU] via SUBCUTANEOUS
  Administered 2022-03-15 (×3): 3 [IU] via SUBCUTANEOUS
  Administered 2022-03-16 (×3): 5 [IU] via SUBCUTANEOUS
  Administered 2022-03-17: 11 [IU] via SUBCUTANEOUS

## 2022-03-12 MED ORDER — METHOCARBAMOL 1000 MG/10ML IJ SOLN
500.0000 mg | Freq: Four times a day (QID) | INTRAMUSCULAR | Status: DC | PRN
  Administered 2022-03-12 – 2022-03-14 (×4): 500 mg via INTRAVENOUS
  Filled 2022-03-12 (×2): qty 5
  Filled 2022-03-12 (×2): qty 500

## 2022-03-12 MED ORDER — LEVOTHYROXINE SODIUM 88 MCG PO TABS
88.0000 ug | ORAL_TABLET | Freq: Every day | ORAL | Status: DC
Start: 2022-03-13 — End: 2022-03-17
  Administered 2022-03-17: 88 ug
  Filled 2022-03-12: qty 1

## 2022-03-12 MED FILL — Thrombin For Soln 5000 Unit: CUTANEOUS | Qty: 5000 | Status: AC

## 2022-03-12 NOTE — Plan of Care (Signed)
  Problem: Education: Goal: Knowledge of General Education information will improve Description Including pain rating scale, medication(s)/side effects and non-pharmacologic comfort measures Outcome: Progressing   Problem: Elimination: Goal: Will not experience complications related to urinary retention Outcome: Progressing   Problem: Safety: Goal: Ability to remain free from injury will improve Outcome: Progressing   

## 2022-03-12 NOTE — Progress Notes (Signed)
Patient began coughing when given pills and a water this am.  RN placed SLP eval.  Dr. Danielle Dess was alerted at the bedside and orders were given to keep patient npo until seen by speech therapist.  Will continue to assess and act accordingly.

## 2022-03-12 NOTE — Evaluation (Signed)
Occupational Therapy Evaluation Patient Details Name: [name] MRN: [id number] DOB: November 16, 1945 Today's Date: 03/12/2022   History of Present Illness 76 yo male presenting 6/22 for cervical corpectomy of C5 with undercutting of C4 and decompression of ossification of the posterior longitudinal ligament and the spinal canal from C3-C5. PMH includes: prior decompression of C3-4, HTN, DM II   Clinical Impression   PTA, pt was living with his wife and was independent. Pt currently requiring Max A for UB ADLs, Max A for LB ADLs, and Mod A +2 for functional transfers with RW. Pt presenting decreased balance, cognition, strength, ROM, and safety. Pt very motivated to participate in therapy. Would benefit from soft collar (if cleared by MD) for recalling cervical precautions. Pt will require further acute OT to facilitate safe dc. Recommend dc to AIR for intensive OT to optimize safety, independence with ADLs, and return to PLOF.      Recommendations for follow up therapy are one component of a multi-disciplinary discharge planning process, led by the attending physician.  Recommendations may be updated based on patient status, additional functional criteria and insurance authorization.   Follow Up Recommendations  Acute inpatient rehab (3hours/day)    Assistance Recommended at Discharge Frequent or constant Supervision/Assistance  Patient can return home with the following A lot of help with walking and/or transfers;A little help with bathing/dressing/bathroom    Functional Status Assessment  Patient has had a recent decline in their functional status and demonstrates the ability to make significant improvements in function in a reasonable and predictable amount of time.  Equipment Recommendations  None recommended by OT    Recommendations for Other Services PT consult     Precautions / Restrictions Precautions Precautions: Cervical Precaution Booklet Issued: Yes (comment) Required  Braces or Orthoses: Other Brace (no brace) Restrictions Weight Bearing Restrictions: No      Mobility Bed Mobility Overal bed mobility: Needs Assistance Bed Mobility: Rolling, Sidelying to Sit Rolling: Min assist, +2 for safety/equipment Sidelying to sit: Mod assist, +2 for physical assistance       General bed mobility comments: Min A for rolling to L and facilitating hip over. Mod A +2 for elevating trunk    Transfers Overall transfer level: Needs assistance Equipment used: Rolling walker (2 wheels) Transfers: Sit to/from Stand, Bed to chair/wheelchair/BSC Sit to Stand: Mod assist, +2 physical assistance     Step pivot transfers: Mod assist, +2 physical assistance     General transfer comment: Mod A for power up and correcting posterior lean. Mod A +2 for step pivot to recliner      Balance Overall balance assessment: Needs assistance Sitting-balance support: No upper extremity supported, Feet supported Sitting balance-Leahy Scale: Fair     Standing balance support: Bilateral upper extremity supported, During functional activity Standing balance-Leahy Scale: Poor Standing balance comment: reliant on phsyical A and UE support                           ADL either performed or assessed with clinical judgement   ADL Overall ADL's : Needs assistance/impaired Eating/Feeding: NPO Eating/Feeding Details (indicate cue type and reason): Able to bring cup to mouth with SLP during swallow study Grooming: Minimal assistance;Sitting   Upper Body Bathing: Maximal assistance;Sitting   Lower Body Bathing: Maximal assistance;Sit to/from stand   Upper Body Dressing : Maximal assistance;Sitting   Lower Body Dressing: Maximal assistance;Sit to/from stand Lower Body Dressing Details (indicate cue type and reason): Initating  bringing ankles up to knees Toilet Transfer: Moderate assistance;+2 for physical assistance;Stand-pivot;Rolling walker (2 wheels) Toilet Transfer  Details (indicate cue type and reason): Mod A +2 for pivot to recliner. Needing Mod A for anterior tilt forward and then able to take side steps         Functional mobility during ADLs: Moderate assistance;+2 for physical assistance;Rolling walker (2 wheels) (stand pivot) General ADL Comments: Pt presenitng with posterior lean in standing, decreased strength, and cognition deficits.     Vision Baseline Vision/History: 1 Wears glasses       Perception     Praxis      Pertinent Vitals/Pain Pain Assessment Pain Assessment: Faces Faces Pain Scale: No hurt Pain Intervention(s): Monitored during session     Hand Dominance Left   Extremity/Trunk Assessment Upper Extremity Assessment Upper Extremity Assessment: Generalized weakness   Lower Extremity Assessment Lower Extremity Assessment: Defer to PT evaluation   Cervical / Trunk Assessment Cervical / Trunk Assessment: Neck Surgery   Communication Communication Communication: No difficulties   Cognition Arousal/Alertness: Awake/alert Behavior During Therapy: WFL for tasks assessed/performed Overall Cognitive Status: Impaired/Different from baseline Area of Impairment: Following commands, Problem solving                       Following Commands: Follows one step commands with increased time     Problem Solving: Slow processing General Comments: Pt able to follow simple commands with increased time. Recalling cervical precautions at end of session (provided education at begining of session).     General Comments  Pt reporting dizziness at EOB. Presenting with palor. BP stable thorughout 130-140s/60-70s. Notified RN    Exercises     Shoulder Instructions      Home Living Family/patient expects to be discharged to:: Private residence Living Arrangements: Spouse/significant other Available Help at Discharge: Family Type of Home: House       Home Layout: Multi-level Alternate Level Stairs-Number of Steps:  8 stairs down with rail on L, 8 steps up with rail on both sides Alternate Level Stairs-Rails: None           Home Equipment: Rolling Walker (2 wheels)          Prior Functioning/Environment Prior Level of Function : Independent/Modified Independent;Driving               ADLs Comments: ADLs, IADLs, and driving        OT Problem List: Decreased strength;Decreased range of motion;Decreased activity tolerance;Impaired balance (sitting and/or standing);Decreased knowledge of use of DME or AE;Decreased knowledge of precautions;Pain;Impaired UE functional use      OT Treatment/Interventions: Self-care/ADL training;Therapeutic exercise;Energy conservation;DME and/or AE instruction;Therapeutic activities;Patient/family education    OT Goals(Current goals can be found in the care plan section) Acute Rehab OT Goals Patient Stated Goal: Get stronger OT Goal Formulation: With patient Time For Goal Achievement: 03/26/22 Potential to Achieve Goals: Good  OT Frequency: Min 2X/week    Co-evaluation PT/OT/SLP Co-Evaluation/Treatment: Yes Reason for Co-Treatment: For patient/therapist safety;To address functional/ADL transfers   OT goals addressed during session: ADL's and self-care;Strengthening/ROM      AM-PAC OT "6 Clicks" Daily Activity     Outcome Measure Help from another person eating meals?: Total Help from another person taking care of personal grooming?: A Little Help from another person toileting, which includes using toliet, bedpan, or urinal?: A Lot Help from another person bathing (including washing, rinsing, drying)?: A Lot Help from another person to put on and taking off  regular upper body clothing?: A Lot Help from another person to put on and taking off regular lower body clothing?: A Lot 6 Click Score: 12   End of Session Equipment Utilized During Treatment: Rolling walker (2 wheels);Gait belt Nurse Communication: Mobility status  Activity Tolerance:  Patient tolerated treatment well Patient left: in chair;with call bell/phone within reach;with chair alarm set  OT Visit Diagnosis: Unsteadiness on feet (R26.81);Other abnormalities of gait and mobility (R26.89);Muscle weakness (generalized) (M62.81);Pain Pain - part of body:  (Neck)                Time: 1004-1040 OT Time Calculation (min): 36 min Charges:  OT General Charges $OT Visit: 1 Visit OT Evaluation $OT Eval Moderate Complexity: 1 Mod  Keara Pagliarulo MSOT, OTR/L Acute Rehab Office: [phone]  Theodoro Grist Corby Villasenor 03/12/2022, 11:18 AM

## 2022-03-12 NOTE — Anesthesia Postprocedure Evaluation (Signed)
Anesthesia Post Note  Patient: NASZIR RIGANO  Procedure(s) Performed: EVACUATION OF ANTERIOR CERVICAL HEMATOMA (Spine Cervical)     Patient location during evaluation: PACU Anesthesia Type: General Level of consciousness: awake and alert Pain management: pain level controlled Vital Signs Assessment: post-procedure vital signs reviewed and stable Respiratory status: spontaneous breathing, nonlabored ventilation and respiratory function stable Cardiovascular status: blood pressure returned to baseline and stable Postop Assessment: no apparent nausea or vomiting Anesthetic complications: no   No notable events documented.  Last Vitals:  Vitals:   03/12/22 0500 03/12/22 0600  BP: (!) 147/65 131/61  Pulse: 90 85  Resp: 17 (!) 22  Temp:    SpO2: 94% 94%    Last Pain:  Vitals:   03/12/22 0353  TempSrc:   PainSc: Asleep                 Naome Brigandi,W. EDMOND

## 2022-03-13 ENCOUNTER — Encounter (HOSPITAL_COMMUNITY): Payer: Self-pay | Admitting: Neurological Surgery

## 2022-03-13 LAB — GLUCOSE, CAPILLARY
Glucose-Capillary: 159 mg/dL — ABNORMAL HIGH (ref 70–99)
Glucose-Capillary: 136 mg/dL — ABNORMAL HIGH (ref 70–99)
Glucose-Capillary: 146 mg/dL — ABNORMAL HIGH (ref 70–99)
Glucose-Capillary: 173 mg/dL — ABNORMAL HIGH (ref 70–99)

## 2022-03-13 MED ORDER — METOPROLOL TARTRATE 5 MG/5ML IV SOLN
2.5000 mg | Freq: Two times a day (BID) | INTRAVENOUS | Status: DC
  Administered 2022-03-13 – 2022-03-15 (×5): 2.5 mg via INTRAVENOUS
  Filled 2022-03-13 (×5): qty 5

## 2022-03-13 MED ORDER — FUROSEMIDE 10 MG/ML IJ SOLN
20.0000 mg | Freq: Two times a day (BID) | INTRAMUSCULAR | Status: DC
  Administered 2022-03-13 – 2022-03-15 (×5): 20 mg via INTRAVENOUS
  Filled 2022-03-13 (×5): qty 2

## 2022-03-13 MED ORDER — DEXAMETHASONE SODIUM PHOSPHATE 4 MG/ML IJ SOLN
4.0000 mg | Freq: Three times a day (TID) | INTRAMUSCULAR | Status: AC
  Administered 2022-03-13 – 2022-03-14 (×3): 4 mg via INTRAVENOUS
  Filled 2022-03-13 (×3): qty 1

## 2022-03-13 NOTE — Progress Notes (Addendum)
Neurosurgery Service Progress Note  Subjective: No acute events overnight, denies SOB or difficulty with breathing, able to swallow saliva but very hoarse   Objective: Vitals:   03/13/22 0500 03/13/22 0600 03/13/22 0700 03/13/22 0800  BP: (!) 153/70 (!) 154/90 (!) 150/78 (!) 153/65  Pulse: (!) 102 100 97 97  Resp: (!) 24 (!) 24 20 19   Temp:    99 F (37.2 C)  TempSrc:    Oral  SpO2: 92% 93% 94% 92%  Weight:      Height:        Physical Exam: Strength 4+/5 in BLE, BUE 4+/5 distally and 4/5 proximally, SILTx4, no hoffman's, incision c/d/I and soft, breathing regular with some bronchial sounds but no stridor  Assessment & Plan: 76 y.o. man s/p ACCF and takeback for hematoma, dysphagia as expected given surgical approach.  -will keep npo, 24h of dex - BG better today but expect some increase -transfer to stepdown -will transition meds to IV while NPO: start metoprolol & hold po carvedilol, start IV lasix and hold po lasix -RFP tomorrow -SCDs/TEDs  Jadene Pierini  03/13/22 9:12 AM

## 2022-03-14 LAB — RENAL FUNCTION PANEL
Albumin: 3.4 g/dL — ABNORMAL LOW (ref 3.5–5.0)
Anion gap: 9 (ref 5–15)
BUN: 13 mg/dL (ref 8–23)
CO2: 24 mmol/L (ref 22–32)
Calcium: 8.6 mg/dL — ABNORMAL LOW (ref 8.9–10.3)
Chloride: 100 mmol/L (ref 98–111)
Creatinine, Ser: 0.72 mg/dL (ref 0.61–1.24)
GFR, Estimated: >60 mL/min (ref >60)
Glucose, Bld: 189 mg/dL — ABNORMAL HIGH (ref 70–99)
Phosphorus: 2.4 mg/dL — ABNORMAL LOW (ref 2.5–4.6)
Potassium: 3.9 mmol/L (ref 3.5–5.1)
Sodium: 133 mmol/L — ABNORMAL LOW (ref 135–145)

## 2022-03-14 LAB — GLUCOSE, CAPILLARY
Glucose-Capillary: 212 mg/dL — ABNORMAL HIGH (ref 70–99)
Glucose-Capillary: 207 mg/dL — ABNORMAL HIGH (ref 70–99)
Glucose-Capillary: 158 mg/dL — ABNORMAL HIGH (ref 70–99)
Glucose-Capillary: 148 mg/dL — ABNORMAL HIGH (ref 70–99)

## 2022-03-14 MED ORDER — SODIUM CHLORIDE 0.9 % IV SOLN: INTRAVENOUS | Status: DC

## 2022-03-14 NOTE — NC FL2 (Addendum)
[place] LEVEL OF CARE SCREENING TOOL     IDENTIFICATION  Patient Name: [name] Birthdate: 1946-05-19 Sex: male Admission Date (Current Location): 03/11/2022  [facility] and [place] Number:  Herbalist and Address:  [facility], [place] [address] [place], [place] [address]      Provider Number: [id number]  Attending Physician Name and Address:  Kristeen Miss, MD  Relative Name and Phone Number:  [name], spouse, [phone]    Current Level of Care: Hospital Recommended Level of Care: [place] Prior Approval Number:    Date Approved/Denied:   PASRR Number: [id number]  Discharge Plan: SNF    Current Diagnoses: Patient Active Problem List   Diagnosis Date Noted   Cervical spondylosis with myelopathy 03/11/2022   Cervical myelopathy ([place]) 02/23/2022   Hospital discharge follow-up 11/26/2021   Hyponatremia 11/26/2021   Prolonged QT interval 11/16/2021   GERD (gastroesophageal reflux disease) 11/16/2021   Blurred vision 11/16/2021   Hypothyroidism 10/05/2021   Hypokalemia 05/18/2021   Muscular deconditioning 05/12/2021   Frequent falls 05/12/2021   Fatigue 04/29/2021   Recent unexplained weight loss 04/23/2021   Left shoulder pain 04/23/2021   Chest tightness 04/23/2021   Deep tissue injury 04/23/2021   Myoclonus 04/15/2021   Pneumonia 04/03/2021   PNA (pneumonia) 04/02/2021   Abnormal finding on urinalysis 04/01/2021   Syncope 02/16/2021   Minor head injury    Right hip pain 02/11/2021   Erectile dysfunction 01/22/2021   Gait abnormality 09/30/2020   Mild non proliferative diabetic retinopathy ([place]) 07/16/2020   Need for immunization against influenza 06/03/2020   S/P left knee arthroscopy 05/06/20 05/13/2020   Abnormal MRI 03/26/2020   Joint disorder of knee 03/26/2020   Fall at home, initial encounter 03/13/2020   Anemia, deficiency 09/03/2019   Reflux esophagitis 09/03/2019    Esophageal dysphagia 05/30/2019   Constipation 05/30/2019   Positive colorectal cancer screening using Cologuard test 05/30/2019   Diabetic foot ulcer ([place]) 09/06/2018   Orthostasis 08/25/2016   Congestive heart failure ([place]) 05/17/2016   Bilateral carotid artery disease ([place]) 07/08/2014   Obstructive sleep apnea 02/12/2014   Restless legs 10/23/2013   Overweight with body mass index (BMI) of 29 to 29.9 in adult 10/23/2013   Hyperlipidemia 07/16/2013   Peripheral arterial disease ([place]) 07/16/2013   Hypertension 03/14/2012   Type 2 diabetes mellitus with circulatory disorder ([place]) 03/14/2012   Peripheral neuropathy 03/14/2012   Cardiomyopathy- EF NL 01/2013, now 30-35% echo 03/14/2012    Orientation RESPIRATION BLADDER Height & Weight     Self, Time, Situation, Place  Normal External catheter Weight: 205 lb (93 kg) Height:  6' (182.9 cm)  BEHAVIORAL SYMPTOMS/MOOD NEUROLOGICAL BOWEL NUTRITION STATUS      Continent Diet (see DC summary)  AMBULATORY STATUS COMMUNICATION OF NEEDS Skin   Extensive Assist Verbally Normal                       Personal Care Assistance Level of Assistance  Bathing, Feeding, Dressing Bathing Assistance: Maximum assistance Feeding assistance: Limited assistance Dressing Assistance: Maximum assistance     Functional Limitations Info  Hearing (hearing aids)   Hearing Info: Impaired      SPECIAL CARE FACTORS FREQUENCY  PT (By licensed PT), OT (By licensed OT)     PT Frequency: 5x weekly OT Frequency: 5x weekly            Contractures Contractures Info: Not present  Additional Factors Info  Allergies, Code Status Code Status Info: full Allergies Info: codeine, tramadol           Current Medications (03/14/2022):  This is the current hospital active medication list Current Facility-Administered Medications  Medication Dose Route Frequency Provider Last Rate Last Admin   0.9 %  sodium chloride infusion  250 mL Intravenous  Continuous Kristeen Miss, MD       0.9 %  sodium chloride infusion   Intravenous Continuous Judith Part, MD 100 mL/hr at 03/14/22 0912 New Bag at 03/14/22 0912   acetaminophen (TYLENOL) tablet 650 mg  650 mg Per Tube Q4H PRN Priscella Mann, RPH       Or   acetaminophen (TYLENOL) suppository 650 mg  650 mg Rectal Q4H PRN Priscella Mann, RPH       alum & mag hydroxide-simeth (MAALOX/MYLANTA) 200-200-20 MG/5ML suspension 30 mL  30 mL Per Tube Q6H PRN Priscella Mann, RPH       bisacodyl (DULCOLAX) suppository 10 mg  10 mg Rectal Daily PRN Kristeen Miss, MD       carvedilol (COREG) tablet 1.5625 mg  1.5625 mg Per Tube BID WC Millen, Jessica B, RPH       Chlorhexidine Gluconate Cloth 2 % PADS 6 each  6 each Topical Daily Kristeen Miss, MD   6 each at 03/14/22 0913   docusate (COLACE) 50 MG/5ML liquid 100 mg  100 mg Per Tube BID Priscella Mann, RPH       furosemide (LASIX) injection 20 mg  20 mg Intravenous BID Judith Part, MD   20 mg at 03/14/22 0912   furosemide (LASIX) tablet 40 mg  40 mg Per Tube BID Priscella Mann, RPH       gabapentin (NEURONTIN) 250 MG/5ML solution 100 mg  100 mg Per Tube QHS Millen, Jessica B, RPH       glimepiride (AMARYL) tablet 4 mg  4 mg Per Tube Q breakfast Priscella Mann, RPH       insulin aspart (novoLOG) injection 0-15 Units  0-15 Units Subcutaneous TID WC Kristeen Miss, MD   5 Units at 03/14/22 3419   lactated ringers infusion   Intravenous Continuous Kristeen Miss, MD 75 mL/hr at 03/14/22 0400 Infusion Verify at 03/14/22 0400   levothyroxine (SYNTHROID) tablet 88 mcg  88 mcg Per Tube Q0600 Sloan Leiter B, RPH       menthol-cetylpyridinium (CEPACOL) lozenge 3 mg  1 lozenge Oral PRN Kristeen Miss, MD       Or   phenol (CHLORASEPTIC) mouth spray 1 spray  1 spray Mouth/Throat PRN Kristeen Miss, MD       methocarbamol (ROBAXIN) tablet 500 mg  500 mg Per Tube Q6H PRN Priscella Mann, RPH       Or   methocarbamol (ROBAXIN) 500 mg in  dextrose 5 % 50 mL IVPB  500 mg Intravenous Q6H PRN Priscella Mann, RPH   Stopped at 03/14/22 0031   metoprolol tartrate (LOPRESSOR) injection 2.5 mg  2.5 mg Intravenous Q12H Judith Part, MD   2.5 mg at 03/14/22 0912   morphine (PF) 2 MG/ML injection 2 mg  2 mg Intravenous Q2H PRN Kristeen Miss, MD   2 mg at 03/13/22 1029   nitroGLYCERIN (NITROSTAT) SL tablet 0.4 mg  0.4 mg Sublingual Q5 min PRN Kristeen Miss, MD       Oral care mouth rinse  15 mL Mouth Rinse PRN Kristeen Miss, MD  pantoprazole sodium (PROTONIX) 40 mg/20 mL oral suspension 40 mg  40 mg Per Tube Daily Millen, Jessica B, RPH       polyethylene glycol (MIRALAX / GLYCOLAX) packet 17 g  17 g Per Tube Daily PRN Priscella Mann, RPH       potassium chloride (KLOR-CON) packet 20 mEq  20 mEq Per Tube Daily Priscella Mann, RPH       rOPINIRole (REQUIP) tablet 0.5 mg  0.5 mg Per Tube TID Priscella Mann, RPH       rosuvastatin (CRESTOR) tablet 20 mg  20 mg Per Tube Daily Priscella Mann, RPH       senna (SENOKOT) tablet 8.6 mg  1 tablet Per Tube BID Sloan Leiter B, RPH       sodium chloride flush (NS) 0.9 % injection 3 mL  3 mL Intravenous Q12H Kristeen Miss, MD   3 mL at 03/14/22 0918   sodium chloride flush (NS) 0.9 % injection 3 mL  3 mL Intravenous PRN Kristeen Miss, MD       sodium phosphate (FLEET) 7-19 GM/118ML enema 1 enema  1 enema Rectal Once PRN Kristeen Miss, MD         Discharge Medications: Please see discharge summary for a list of discharge medications.  Relevant Imaging Results:  Relevant Lab Results:   Additional Information SSN [id number].  Moderna COVID-19 Vaccine 08/21/2020 , 12/05/2019 , 11/07/2019  [place] COVID-19 Vaccine 08/21/2020    Alfredia Ferguson, LCSW

## 2022-03-15 ENCOUNTER — Inpatient Hospital Stay (HOSPITAL_COMMUNITY): Payer: HMO

## 2022-03-15 LAB — TYPE AND SCREEN
ABO/RH(D): A POS
Antibody Screen: NEGATIVE
Unit division: 0
Unit division: 0
Unit division: 0
Unit division: 0

## 2022-03-15 LAB — GLUCOSE, CAPILLARY
Glucose-Capillary: 192 mg/dL — ABNORMAL HIGH (ref 70–99)
Glucose-Capillary: 184 mg/dL — ABNORMAL HIGH (ref 70–99)
Glucose-Capillary: 181 mg/dL — ABNORMAL HIGH (ref 70–99)

## 2022-03-15 LAB — BPAM RBC
Blood Product Expiration Date: 202307162359
Blood Product Expiration Date: 202307162359
Blood Product Expiration Date: 202307162359
Blood Product Expiration Date: 202307172359
ISSUE DATE / TIME: 202306222048
ISSUE DATE / TIME: 202306231056
Unit Type and Rh: 6200
Unit Type and Rh: 6200
Unit Type and Rh: 6200
Unit Type and Rh: 6200

## 2022-03-15 LAB — BLOOD GAS, ARTERIAL
Acid-Base Excess: 6.9 mmol/L — ABNORMAL HIGH (ref 0.0–2.0)
Bicarbonate: 32.5 mmol/L — ABNORMAL HIGH (ref 20.0–28.0)
O2 Saturation: 98 %
Patient temperature: 36.5
pCO2 arterial: 48 mmHg (ref 32–48)
pH, Arterial: 7.44 (ref 7.35–7.45)
pO2, Arterial: 97 mmHg (ref 83–108)

## 2022-03-15 LAB — RENAL FUNCTION PANEL
Albumin: 3.2 g/dL — ABNORMAL LOW (ref 3.5–5.0)
Anion gap: 13 (ref 5–15)
BUN: 19 mg/dL (ref 8–23)
CO2: 25 mmol/L (ref 22–32)
Calcium: 8.8 mg/dL — ABNORMAL LOW (ref 8.9–10.3)
Chloride: 100 mmol/L (ref 98–111)
Creatinine, Ser: 0.71 mg/dL (ref 0.61–1.24)
GFR, Estimated: >60 mL/min (ref >60)
Glucose, Bld: 167 mg/dL — ABNORMAL HIGH (ref 70–99)
Phosphorus: 2.3 mg/dL — ABNORMAL LOW (ref 2.5–4.6)
Potassium: 3.4 mmol/L — ABNORMAL LOW (ref 3.5–5.1)
Sodium: 138 mmol/L (ref 135–145)

## 2022-03-15 MED ORDER — POTASSIUM PHOSPHATES 15 MMOLE/5ML IV SOLN
15.0000 mmol | Freq: Once | INTRAVENOUS | Status: AC
  Administered 2022-03-15: 15 mmol via INTRAVENOUS
  Filled 2022-03-15: qty 5

## 2022-03-15 MED ORDER — DEXTROSE 5 % IV SOLN: 500.0000 mg | Freq: Four times a day (QID) | INTRAVENOUS | Status: DC | PRN

## 2022-03-15 MED ORDER — METHOCARBAMOL 500 MG PO TABS
500.0000 mg | ORAL_TABLET | Freq: Four times a day (QID) | ORAL | Status: DC | PRN
Start: 2022-03-15 — End: 2022-03-23
  Administered 2022-03-16 – 2022-03-23 (×8): 500 mg via ORAL
  Filled 2022-03-15 (×9): qty 1

## 2022-03-15 MED ORDER — METOPROLOL TARTRATE 5 MG/5ML IV SOLN
5.0000 mg | Freq: Three times a day (TID) | INTRAVENOUS | Status: DC | PRN
Start: 2022-03-15 — End: 2022-03-23

## 2022-03-15 MED ORDER — DEXAMETHASONE SODIUM PHOSPHATE 4 MG/ML IJ SOLN
4.0000 mg | Freq: Three times a day (TID) | INTRAMUSCULAR | Status: DC
  Administered 2022-03-15 – 2022-03-17 (×5): 4 mg via INTRAVENOUS
  Filled 2022-03-15 (×5): qty 1

## 2022-03-15 MED ORDER — DEXAMETHASONE SODIUM PHOSPHATE 10 MG/ML IJ SOLN
10.0000 mg | Freq: Once | INTRAMUSCULAR | Status: AC
  Administered 2022-03-15: 10 mg via INTRAVENOUS
  Filled 2022-03-15: qty 1

## 2022-03-15 NOTE — Progress Notes (Signed)
Patient ID: XAIVER SEEKINS, male   DOB: [birth year], 76 y.o.   MRN: [id number] Patient is awake alert and voice is still very hoarse however he is able to swallow some ice cubes today.  The swelling is significant still in his neck but it does appear to be improving very slowly I had left a message for his wife that he is improving and discussed with Jaci Lazier situation I we will change him over from nasal cannula oxygen which is very dry to humidified facemask which should hopefully allow some of the swelling to further subside and allow his breathing to be somewhat easier.  His motor function in the upper extremities appear stable and we will continue to follow along he is still on low-dose Decadron.

## 2022-03-15 NOTE — Progress Notes (Addendum)
   03/15/22 1728  Vitals  Temp 99.7 F (37.6 C)  Temp Source Axillary  BP (!) 187/96  MAP (mmHg) 122  BP Location Right Arm  Patient Position (if appropriate) Lying  Pulse Rate (!) 106  Pulse Rate Source Monitor  ECG Heart Rate (!) 106  Resp (!) 22  Level of Consciousness  Level of Consciousness Responds to Voice  MEWS COLOR  MEWS Score Color Yellow  Oxygen Therapy  SpO2 90 %  O2 Device Aerosol Mask  O2 Flow Rate (L/min) 10 L/min  FiO2 (%) 80 %  MEWS Score  MEWS Temp 0  MEWS Systolic 0  MEWS Pulse 1  MEWS RR 1  MEWS LOC 1  MEWS Score 3   Robina Ade, RN

## 2022-03-16 ENCOUNTER — Inpatient Hospital Stay (HOSPITAL_COMMUNITY): Payer: HMO

## 2022-03-16 DIAGNOSIS — M4712 Other spondylosis with myelopathy, cervical region: Secondary | ICD-10-CM | POA: Diagnosis not present

## 2022-03-16 DIAGNOSIS — E43 Unspecified severe protein-calorie malnutrition: Secondary | ICD-10-CM | POA: Insufficient documentation

## 2022-03-16 DIAGNOSIS — R0982 Postnasal drip: Secondary | ICD-10-CM | POA: Diagnosis not present

## 2022-03-16 DIAGNOSIS — K219 Gastro-esophageal reflux disease without esophagitis: Secondary | ICD-10-CM | POA: Diagnosis not present

## 2022-03-16 DIAGNOSIS — R490 Dysphonia: Secondary | ICD-10-CM | POA: Diagnosis not present

## 2022-03-16 LAB — GLUCOSE, CAPILLARY
Glucose-Capillary: 250 mg/dL — ABNORMAL HIGH (ref 70–99)
Glucose-Capillary: 213 mg/dL — ABNORMAL HIGH (ref 70–99)
Glucose-Capillary: 231 mg/dL — ABNORMAL HIGH (ref 70–99)
Glucose-Capillary: 200 mg/dL — ABNORMAL HIGH (ref 70–99)

## 2022-03-16 LAB — MAGNESIUM: Magnesium: 2 mg/dL (ref 1.7–2.4)

## 2022-03-16 LAB — PHOSPHORUS: Phosphorus: 2.3 mg/dL — ABNORMAL LOW (ref 2.5–4.6)

## 2022-03-16 MED ORDER — AZELASTINE HCL 0.1 % NA SOLN
1.0000 | Freq: Every day | NASAL | Status: DC
  Administered 2022-03-17 – 2022-03-22 (×6): 1 via NASAL
  Filled 2022-03-16: qty 30

## 2022-03-16 MED ORDER — METOPROLOL TARTRATE 5 MG/5ML IV SOLN
2.5000 mg | Freq: Two times a day (BID) | INTRAVENOUS | Status: DC
Start: 2022-03-16 — End: 2022-03-17
  Administered 2022-03-16 (×2): 2.5 mg via INTRAVENOUS
  Filled 2022-03-16 (×2): qty 5

## 2022-03-16 MED ORDER — PANTOPRAZOLE SODIUM 40 MG IV SOLR
40.0000 mg | Freq: Every day | INTRAVENOUS | Status: DC
  Administered 2022-03-16: 40 mg via INTRAVENOUS
  Filled 2022-03-16: qty 10

## 2022-03-16 MED ORDER — FLUTICASONE PROPIONATE 50 MCG/ACT NA SUSP
1.0000 | Freq: Every day | NASAL | Status: DC
  Administered 2022-03-17 – 2022-03-23 (×7): 1 via NASAL
  Filled 2022-03-16: qty 16

## 2022-03-16 MED ORDER — LIDOCAINE VISCOUS HCL 2 % MT SOLN
15.0000 mL | Freq: Once | OROMUCOSAL | Status: AC
  Administered 2022-03-16: 4 mL via OROMUCOSAL

## 2022-03-16 MED ORDER — GLUCERNA 1.5 CAL PO LIQD
1000.0000 mL | ORAL | Status: DC
  Administered 2022-03-16: 1000 mL
  Filled 2022-03-16 (×6): qty 1000

## 2022-03-16 MED ORDER — PROSOURCE TF PO LIQD
45.0000 mL | Freq: Every day | ORAL | Status: DC
Start: 2022-03-16 — End: 2022-03-22
  Administered 2022-03-16 – 2022-03-22 (×6): 45 mL
  Filled 2022-03-16 (×7): qty 45

## 2022-03-16 MED ORDER — SALINE SPRAY 0.65 % NA SOLN
2.0000 | Freq: Two times a day (BID) | NASAL | Status: DC
  Administered 2022-03-16 – 2022-03-23 (×14): 2 via NASAL
  Filled 2022-03-16: qty 44

## 2022-03-16 NOTE — Progress Notes (Signed)
NAME:  [name], MRN:  [id number], DOB:  11/20/1945, LOS: 5 ADMISSION DATE:  03/11/2022, CONSULTATION DATE:  03/15/22 REFERRING MD:  Dr. Danielle Dess, CHIEF COMPLAINT:  worsening hypoxia  History of Present Illness:   76 year old male who with previous severe C3-C4 stenosis with prior decompression years prior with worsening left arm weakness with MRI demonstrating left sided cord compression secondary to ossification of the posterior longitudinal ligament creating significant cord compression on left side.  He was admitted to NSGY on 6/22 for C5 corpectomy and ACCF found to have increased weakness postoperatively, taken back to OR for evacuation of epidural hematoma and revision of anterior decompression and fixation from C4-C6.  Hospitalization complicated by ongoing dysphagia despite steroids.  Speech has been following and remains NPO except ice chips.  On 6/26, he has had increasing hypoxia with concerns for aspiration.  PCCM consulted for further recommendations.  Pertinent  Medical History  Severe cervical myelopathy, HTN, DM, chronic cough  Significant Hospital Events: Including procedures, antibiotic start and stop dates in addition to other pertinent events   6/22 admitted NSGY, OR C5 corpectomy/ ACCF> takeback to OR for epidural hematoma evac, decompression and C4-C6 fixation.  Interim History / Subjective:  Transferred to ICU yesterday evening. No acute events overnight. Unable to place NG overnight. Upper airway seems more clear today with the steroids restarted. Now down to 2L [place] with sats in the low 90s. Wants to eat.   Objective   Blood pressure (!) 164/109, pulse 95, temperature 99.7 F (37.6 C), temperature source Axillary, resp. rate (!) 23, height 6' (1.829 m), weight 93 kg, SpO2 98 %.    FiO2 (%):  [80 %] 80 %   Intake/Output Summary (Last 24 hours) at 03/16/2022 0742 Last data filed at 03/16/2022 0400 Gross per 24 hour  Intake 3019.41 ml  Output 1250 ml  Net 1769.41  ml    Filed Weights   03/11/22 0857  Weight: 93 kg    Examination:  General: Elderly male in bedside chair HENT: Anterior neck edema L > R. Hoarse phonation.  Lungs: Clear bilateral breath sounds. No distress. 2L [place].  Cardiovascular: RRR, no MRG Abdomen: Soft, NT, ND Extremities: No acute deformity. Globally weak.  Neuro: Alert, oriented, non-focal.   Resolved Hospital Problem list     Assessment & Plan:   Acute respiratory failure with hypoxia: s/p C5 corpectomy and then evacuation of epidural hematoma post op. I worry the edema may be contributing to some aspiration. He has been diuresed without any improvement. Now up to 10L facemask with sats in the low to mid 90s.  - Continue decadron - Dry on exam, no further diuresis.  - NPO, perhaps can work with slp today now that upper airway sounds mor clear.' - Supplemental O2 to keep sats > 90% - Can transfer out of ICU, will ask the hospitalists to assist with ongoing medical care.   Spondylitic myelopathy C 4-5, C 5-6. S/p ACCF and takeback for hematoma. Course complicated by dysphagia. - Management per neurosurgery - Steroids  DM  - CBG monitoring and SSI  Hypothyroidism - restart synthroid once we have enteral access  HTN HLD - dc coreg, metoprolol PO - IV metoprolol scheduled - holding home rosuvastatin  Nutrition: unable to place cortrak last week, now unable to place NG. No significant nutrition for several days.  - Will consult IR for small bore feeding tube. RN staff have been unable to place at bedside. No oral nutrition for  several days. -Hopefully SLP can re-evaluate today.    Best Practice (right click and "Reselect all SmartList Selections" daily)   Diet/type: NPO DVT prophylaxis: not indicated post op defer to primary GI prophylaxis: PPI Lines: N/A Foley:  N/A Code Status:  full code Last date of multidisciplinary goals of care discussion [ ]   Critical care time:      Joneen Roach,  AGACNP-BC [facility]  See Amion for personal pager PCCM on call pager [phone] until 7pm. Please call Elink 7p-7a. [phone]  03/16/2022 7:42 AM

## 2022-03-16 NOTE — Progress Notes (Signed)
Speech Language Pathology Treatment: Dysphagia  Patient Details Name: NORE KANODE MRN: [id number] DOB: 10-Aug-1946 Today's Date: 03/16/2022 Time: 0915-0930 SLP Time Calculation (min) (ACUTE ONLY): 15 min  Assessment / Plan / Recommendation Clinical Impression  Pt up in chair, still dry but looking a bit stronger today and speech clearer. Pt again tolerating ice and small sips of water with occasional throat clearing. Hard to say subjectively if pt is aspirating or what level of improvement he has achieved from an edema standpoint. He has gotten some steroids. Very unlikely that he would manage appropriate nutrition at this point, but may be capable of more consistent access to sips of liquids. Recommend attempting placement of NG tube under fluoroscopy followed by repeat MBS to determine if pt can swallow sips for therapy and comfort while NG tube in place.   HPI HPI: Auston Garness is a 76 year old male who has had a previous a severe stenosis at C3-C4 which Dr Danielle Dess decompressed years ago.  The patient had developed some increasing weakness in his left arm was particularly and the recent MRI demonstrated the presence of left-sided cord compression secondary to ossification of the posterior longitudinal ligament creating significant cord compression on that left side.   He had undergone decompression earlier on 6/22 with fixation from C4-C6, but postoperatively was noted to be weak. CT scan was performed and this demonstrated presence of some blood in the epidural space and some increased soft tissue swelling also suggested of blood and there was some evidence of residual bone still in the canal superiorly at the level of C4.  He was taken back to the operating room to undergo revision and evacuation. Hardware removed with more removal of bone with a longer cage placed. Pt observed to cough with pills given in am on 6/23.      SLP Plan  MBS      Recommendations for follow up therapy are one  component of a multi-disciplinary discharge planning process, led by the attending physician.  Recommendations may be updated based on patient status, additional functional criteria and insurance authorization.    Recommendations  Diet recommendations: NPO                Follow Up Recommendations: Skilled nursing-short term rehab (<3 hours/day) Assistance recommended at discharge: Frequent or constant Supervision/Assistance Plan: MBS           Mamie Hundertmark, Riley Nearing  03/16/2022, 9:38 AM

## 2022-03-16 NOTE — Progress Notes (Signed)
Physical Therapy Treatment Patient Details Name: [name] MRN: [id number] DOB: 1946-03-25 Today's Date: 03/16/2022   History of Present Illness 76 yo male presenting 6/22 for cervical corpectomy of C5 with undercutting of C4 and decompression of ossification of the posterior longitudinal ligament and the spinal canal from C3-C5. PMH includes: prior decompression of C3-4, HTN, DM II    PT Comments    Patient progressing slowly towards PT goals. Respiratory status seems improved from prior session. Session focused on functional mobility and transfers. Requires Max A of 2 to stand from chair with cues for technique/hand placement but noted to have right lateral lean and flexed hips/knees preventing pt from getting upright. Performed SPT chair to bed with MAx A of 2. Participated in there ex of BLEs- bridging and quad sets.   Pt had been sitting up for most of the morning so was likely fatigued from being up. Able to follow simple commands with increased time but difficulty with sequencing and problem solving. Continues to be appropriate for AIR. Will follow.   Recommendations for follow up therapy are one component of a multi-disciplinary discharge planning process, led by the attending physician.  Recommendations may be updated based on patient status, additional functional criteria and insurance authorization.  Follow Up Recommendations  Acute inpatient rehab (3hours/day)     Assistance Recommended at Discharge Frequent or constant Supervision/Assistance  Patient can return home with the following Two people to help with walking and/or transfers;A lot of help with bathing/dressing/bathroom;Assistance with cooking/housework;Direct supervision/assist for medications management;Direct supervision/assist for financial management;Assist for transportation;Help with stairs or ramp for entrance   Equipment Recommendations  None recommended by PT    Recommendations for Other Services        Precautions / Restrictions Precautions Precautions: Cervical Precaution Booklet Issued: Yes (comment) Required Braces or Orthoses: Other Brace Other Brace: no brace needed per order set Restrictions Weight Bearing Restrictions: No     Mobility  Bed Mobility Overal bed mobility: Needs Assistance Bed Mobility: Sit to Supine       Sit to supine: Max assist, +2 for physical assistance, +2 for safety/equipment   General bed mobility comments: Assist with trunk and bringing LEs into bed.    Transfers Overall transfer level: Needs assistance Equipment used: Rolling walker (2 wheels), 2 person hand held assist Transfers: Sit to/from Stand, Bed to chair/wheelchair/BSC Sit to Stand: Max assist, +2 physical assistance, +2 safety/equipment Stand pivot transfers: Max assist, +2 physical assistance, +2 safety/equipment         General transfer comment: Stood from chair x1 with cues for hand/foot placement. Pt needs assist to widen BoS and bring LEs under CoM. MAx A of 2 for upright with icnreased hip/knee flexion and right lateral lean with pt pushing with LUE. Performed SPT towards right using belt and pad.    Ambulation/Gait               General Gait Details: Unable   Stairs             Wheelchair Mobility    Modified Rankin (Stroke Patients Only)       Balance Overall balance assessment: Needs assistance Sitting-balance support: No upper extremity supported, Feet supported Sitting balance-Leahy Scale: Poor Sitting balance - Comments: able to maintain sitting balance for a few seconds with close min G - requires cues to sustain. otherwise needs min-mod A Postural control: Posterior lean Standing balance support: During functional activity Standing balance-Leahy Scale: Zero Standing balance comment: Unable to stand  without Max A of 2                            Cognition Arousal/Alertness: Awake/alert Behavior During Therapy: WFL for tasks  assessed/performed Overall Cognitive Status: Impaired/Different from baseline Area of Impairment: Attention, Following commands, Safety/judgement, Awareness, Problem solving                   Current Attention Level: Sustained Memory: Decreased recall of precautions, Decreased short-term memory Following Commands: Follows one step commands with increased time Safety/Judgement: Decreased awareness of safety, Decreased awareness of deficits Awareness: Intellectual Problem Solving: Slow processing, Decreased initiation, Difficulty sequencing, Requires verbal cues General Comments: pt requires max cues throughout for functional tasks for problem solving, sequencing, attention and cervical precautions. Perseverating on "pee bag." limited insight to deficits        Exercises      General Comments General comments (skin integrity, edema, etc.): Sp02 dropped to 87% on 2.5L/min 02 [place] but recovered quickly with cues for pursed lip breathing.      Pertinent Vitals/Pain Pain Assessment Pain Assessment: Faces Faces Pain Scale: Hurts little more Pain Location: generalized with movement Pain Descriptors / Indicators: Grimacing Pain Intervention(s): Monitored during session, Limited activity within patient's tolerance    Home Living                          Prior Function            PT Goals (current goals can now be found in the care plan section) Progress towards PT goals: Progressing toward goals (slowly)    Frequency    Min 5X/week      PT Plan Current plan remains appropriate    Co-evaluation PT/OT/SLP Co-Evaluation/Treatment: Yes Reason for Co-Treatment: Complexity of the patient's impairments (multi-system involvement);For patient/therapist safety;To address functional/ADL transfers PT goals addressed during session: Mobility/safety with mobility;Balance;Proper use of DME;Strengthening/ROM OT goals addressed during session: ADL's and self-care       AM-PAC PT "6 Clicks" Mobility   Outcome Measure  Help needed turning from your back to your side while in a flat bed without using bedrails?: Total Help needed moving from lying on your back to sitting on the side of a flat bed without using bedrails?: Total Help needed moving to and from a bed to a chair (including a wheelchair)?: Total Help needed standing up from a chair using your arms (e.g., wheelchair or bedside chair)?: Total Help needed to walk in hospital room?: Total Help needed climbing 3-5 steps with a railing? : Total 6 Click Score: 6    End of Session Equipment Utilized During Treatment: Gait belt Activity Tolerance: Patient limited by lethargy;Patient limited by fatigue Patient left: in bed;with call bell/phone within reach;with bed alarm set;with nursing/sitter in room Nurse Communication: Mobility status;Need for lift equipment PT Visit Diagnosis: Unsteadiness on feet (R26.81);Other abnormalities of gait and mobility (R26.89);History of falling (Z91.81);Muscle weakness (generalized) (M62.81)     Time: 1610-9604 PT Time Calculation (min) (ACUTE ONLY): 24 min  Charges:  $Therapeutic Activity: 8-22 mins                     Vale Haven, PT, DPT Acute Rehabilitation Services Secure chat preferred Office [phone]      Blake Divine A Lanier Ensign 03/16/2022, 1:47 PM

## 2022-03-16 NOTE — Progress Notes (Addendum)
Initial Nutrition Assessment  DOCUMENTATION CODES:   Severe malnutrition in context of chronic illness  INTERVENTION:   Initiate tube feeding via small bore ng tube: Glucerna 1.5 at 25 ml/h and increase by 10 ml every 8 hours to goal rate of 65 ml/hr (1560 ml per day) Prosource TF 45 ml daily  Provides 2380 kcal, 140 gm protein, 1185 ml free water daily  Monitor magnesium and phosphorus every 12 hours x 4 occurrences, MD to replete as needed, as pt is at risk for refeeding syndrome given severe malnutrition.   NUTRITION DIAGNOSIS:   Severe Malnutrition related to chronic illness as evidenced by severe fat depletion, severe muscle depletion, percent weight loss.  GOAL:   Patient will meet greater than or equal to 90% of their needs  MONITOR:   TF tolerance, Diet advancement, PO intake  REASON FOR ASSESSMENT:   Consult, Rounds Enteral/tube feeding initiation and management  ASSESSMENT:   Pt with PMH of severe cervical myelopathy and previous decompression, HTN, DM, and chronic cough admitted by NSGY on 6/22 for C5 corpectomy and ACCF.   Pt discussed during ICU rounds and with RN.  Per pt he lost 50 lb after a knee injury due to poor appetite. Reports poor appetite PTA as well. Pt lives at home with wife.   6/22 admitted; s/p C5 corpectomy/ACCF, take back to OR for epidural hematoma evac, decompression and C4-56 fixation 6/23 failed cortrak placement 6/26 tx to ICU, unable to place NG tube  6/27 s/p MBS, per SLP ok to have sips of nectar and bites of puree with staff; 10 F small bore feeding tube placed in IR; coiled in stomach per xray   Medications reviewed and include: decadron, colace, amaryl with breakfast, SSI, synthroid, KCl 20 mEq daily, senokot  Labs reviewed: K: 3.4, PO4: 2.3 (lab from 6/26 - given 15 mmol)  A1C: 6.1 (03/04/22) CBG's: 181-250    NUTRITION - FOCUSED PHYSICAL EXAM:  Flowsheet Row Most Recent Value  Orbital Region Moderate depletion  Upper  Arm Region Severe depletion  Thoracic and Lumbar Region Severe depletion  Buccal Region Severe depletion  Temple Region Severe depletion  Clavicle Bone Region Severe depletion  Clavicle and Acromion Bone Region Severe depletion  Scapular Bone Region Severe depletion  Dorsal Hand Severe depletion  Patellar Region Severe depletion  Anterior Thigh Region Severe depletion  Posterior Calf Region Moderate depletion  Edema (RD Assessment) None  Hair Reviewed  Eyes Reviewed  Mouth Reviewed  [per pt he has dentures here but currently not in]  Skin Reviewed  Nails Reviewed       Diet Order:   Diet Order             Diet NPO time specified Except for: Other (See Comments)  Diet effective now                   EDUCATION NEEDS:   Education needs have been addressed  Skin:  Skin Assessment: Skin Integrity Issues: Skin Integrity Issues:: Diabetic Ulcer, Incisions Diabetic Ulcer: R plantar foot DM ulcer Incisions: cervical  Last BM:  6/26  Height:   Ht Readings from Last 1 Encounters:  03/11/22 6' (1.829 m)    Weight:   Wt Readings from Last 1 Encounters:  03/17/22 88.3 kg    BMI:  Body mass index is 26.4 kg/m.  Estimated Nutritional Needs:   Kcal:  2200-2400  Protein:  130-150 grams  Fluid:  >2.2 L/day  Lockie Pares., RD, LDN, CNSC  See AMiON for contact information

## 2022-03-17 ENCOUNTER — Ambulatory Visit (HOSPITAL_COMMUNITY): Payer: HMO

## 2022-03-17 DIAGNOSIS — R0982 Postnasal drip: Secondary | ICD-10-CM | POA: Diagnosis not present

## 2022-03-17 DIAGNOSIS — R490 Dysphonia: Secondary | ICD-10-CM

## 2022-03-17 DIAGNOSIS — M4712 Other spondylosis with myelopathy, cervical region: Secondary | ICD-10-CM | POA: Diagnosis not present

## 2022-03-17 DIAGNOSIS — K219 Gastro-esophageal reflux disease without esophagitis: Secondary | ICD-10-CM | POA: Diagnosis not present

## 2022-03-17 LAB — GLUCOSE, CAPILLARY
Glucose-Capillary: 174 mg/dL — ABNORMAL HIGH (ref 70–99)
Glucose-Capillary: 185 mg/dL — ABNORMAL HIGH (ref 70–99)
Glucose-Capillary: 283 mg/dL — ABNORMAL HIGH (ref 70–99)
Glucose-Capillary: 284 mg/dL — ABNORMAL HIGH (ref 70–99)
Glucose-Capillary: 288 mg/dL — ABNORMAL HIGH (ref 70–99)
Glucose-Capillary: 314 mg/dL — ABNORMAL HIGH (ref 70–99)

## 2022-03-17 LAB — PHOSPHORUS
Phosphorus: 3.2 mg/dL (ref 2.5–4.6)
Phosphorus: 2.2 mg/dL — ABNORMAL LOW (ref 2.5–4.6)

## 2022-03-17 LAB — MAGNESIUM
Magnesium: 2.2 mg/dL (ref 1.7–2.4)
Magnesium: 1.9 mg/dL (ref 1.7–2.4)

## 2022-03-17 MED ORDER — FUROSEMIDE 40 MG PO TABS
40.0000 mg | ORAL_TABLET | Freq: Two times a day (BID) | ORAL | Status: DC
  Administered 2022-03-17 – 2022-03-23 (×13): 40 mg via ORAL
  Filled 2022-03-17 (×13): qty 1

## 2022-03-17 MED ORDER — CARVEDILOL 3.125 MG PO TABS
3.1250 mg | ORAL_TABLET | Freq: Two times a day (BID) | ORAL | Status: DC
Start: 2022-03-17 — End: 2022-03-17
  Administered 2022-03-17: 3.125 mg via NASOGASTRIC
  Filled 2022-03-17: qty 1

## 2022-03-17 MED ORDER — ENOXAPARIN SODIUM 40 MG/0.4ML IJ SOSY
40.0000 mg | PREFILLED_SYRINGE | INTRAMUSCULAR | Status: DC
  Administered 2022-03-17 – 2022-03-22 (×6): 40 mg via SUBCUTANEOUS
  Filled 2022-03-17 (×6): qty 0.4

## 2022-03-17 MED ORDER — PANTOPRAZOLE 2 MG/ML SUSPENSION
40.0000 mg | Freq: Every day | ORAL | Status: DC
  Administered 2022-03-17 – 2022-03-22 (×6): 40 mg via ORAL
  Filled 2022-03-17 (×6): qty 20

## 2022-03-17 MED ORDER — INSULIN ASPART 100 UNIT/ML IJ SOLN
0.0000 [IU] | INTRAMUSCULAR | Status: DC
  Administered 2022-03-17: 3 [IU] via SUBCUTANEOUS
  Administered 2022-03-17: 8 [IU] via SUBCUTANEOUS
  Administered 2022-03-17 – 2022-03-18 (×3): 3 [IU] via SUBCUTANEOUS
  Administered 2022-03-18: 11 [IU] via SUBCUTANEOUS
  Administered 2022-03-18: 3 [IU] via SUBCUTANEOUS
  Administered 2022-03-18: 2 [IU] via SUBCUTANEOUS
  Administered 2022-03-18: 3 [IU] via SUBCUTANEOUS
  Administered 2022-03-19: 5 [IU] via SUBCUTANEOUS
  Administered 2022-03-19: 2 [IU] via SUBCUTANEOUS
  Administered 2022-03-19: 3 [IU] via SUBCUTANEOUS

## 2022-03-17 MED ORDER — CARVEDILOL 3.125 MG PO TABS
3.1250 mg | ORAL_TABLET | Freq: Two times a day (BID) | ORAL | Status: DC
  Administered 2022-03-17 – 2022-03-23 (×12): 3.125 mg via ORAL
  Filled 2022-03-17 (×12): qty 1

## 2022-03-17 MED ORDER — SENNA 8.6 MG PO TABS
1.0000 | ORAL_TABLET | Freq: Two times a day (BID) | ORAL | Status: DC
  Administered 2022-03-17 – 2022-03-23 (×10): 8.6 mg via ORAL
  Filled 2022-03-17 (×11): qty 1

## 2022-03-17 MED ORDER — PANTOPRAZOLE 2 MG/ML SUSPENSION: 40.0000 mg | Freq: Every day | ORAL | Status: DC

## 2022-03-17 MED ORDER — GLUCERNA SHAKE PO LIQD
237.0000 mL | Freq: Three times a day (TID) | ORAL | Status: DC
  Administered 2022-03-17 – 2022-03-18 (×2): 237 mL via ORAL
  Filled 2022-03-17 (×3): qty 237

## 2022-03-17 MED ORDER — GABAPENTIN 250 MG/5ML PO SOLN
100.0000 mg | Freq: Every day | ORAL | Status: DC
Start: 2022-03-17 — End: 2022-03-23
  Administered 2022-03-17 – 2022-03-22 (×6): 100 mg via ORAL
  Filled 2022-03-17 (×9): qty 2

## 2022-03-17 MED ORDER — ALUM & MAG HYDROXIDE-SIMETH 200-200-20 MG/5ML PO SUSP: 30.0000 mL | Freq: Four times a day (QID) | ORAL | Status: DC | PRN

## 2022-03-17 MED ORDER — DEXAMETHASONE 4 MG PO TABS
4.0000 mg | ORAL_TABLET | Freq: Two times a day (BID) | ORAL | Status: DC
  Administered 2022-03-17 – 2022-03-20 (×6): 4 mg via ORAL
  Filled 2022-03-17 (×6): qty 1

## 2022-03-17 MED ORDER — INSULIN GLARGINE-YFGN 100 UNIT/ML ~~LOC~~ SOLN
10.0000 [IU] | Freq: Every day | SUBCUTANEOUS | Status: DC
  Administered 2022-03-18 – 2022-03-22 (×6): 10 [IU] via SUBCUTANEOUS
  Filled 2022-03-17 (×8): qty 0.1

## 2022-03-17 MED ORDER — ORAL CARE MOUTH RINSE
15.0000 mL | OROMUCOSAL | Status: DC
  Administered 2022-03-17 – 2022-03-23 (×24): 15 mL via OROMUCOSAL

## 2022-03-17 MED ORDER — ROSUVASTATIN CALCIUM 20 MG PO TABS
20.0000 mg | ORAL_TABLET | Freq: Every day | ORAL | Status: DC
  Administered 2022-03-18 – 2022-03-23 (×6): 20 mg via ORAL
  Filled 2022-03-17 (×6): qty 1

## 2022-03-17 MED ORDER — POTASSIUM CHLORIDE 20 MEQ PO PACK
20.0000 meq | PACK | Freq: Every day | ORAL | Status: DC
Start: 2022-03-18 — End: 2022-03-23
  Administered 2022-03-18 – 2022-03-23 (×6): 20 meq via ORAL
  Filled 2022-03-17 (×6): qty 1

## 2022-03-17 MED ORDER — DEXAMETHASONE 4 MG PO TABS: 4.0000 mg | ORAL_TABLET | Freq: Two times a day (BID) | ORAL | Status: DC

## 2022-03-17 MED ORDER — GLIMEPIRIDE 4 MG PO TABS
4.0000 mg | ORAL_TABLET | Freq: Every day | ORAL | Status: DC
  Administered 2022-03-18: 4 mg via ORAL
  Filled 2022-03-17 (×2): qty 1

## 2022-03-17 MED ORDER — INSULIN ASPART 100 UNIT/ML IJ SOLN
3.0000 [IU] | INTRAMUSCULAR | Status: DC
  Administered 2022-03-17 – 2022-03-23 (×17): 3 [IU] via SUBCUTANEOUS

## 2022-03-17 MED ORDER — ROPINIROLE HCL 1 MG PO TABS
0.5000 mg | ORAL_TABLET | Freq: Three times a day (TID) | ORAL | Status: DC
  Administered 2022-03-17 – 2022-03-23 (×18): 0.5 mg via ORAL
  Filled 2022-03-17 (×20): qty 1

## 2022-03-17 MED ORDER — LEVOTHYROXINE SODIUM 88 MCG PO TABS
88.0000 ug | ORAL_TABLET | Freq: Every day | ORAL | Status: DC
  Administered 2022-03-18 – 2022-03-23 (×6): 88 ug via ORAL
  Filled 2022-03-17 (×6): qty 1

## 2022-03-17 NOTE — Inpatient Diabetes Management (Signed)
Inpatient Diabetes Program Recommendations  AACE/ADA: New Consensus Statement on Inpatient Glycemic Control   Target Ranges:  Prepandial:   less than 140 mg/dL      Peak postprandial:   less than 180 mg/dL (1-2 hours)      Critically ill patients:  140 - 180 mg/dL    Latest Reference Range & Units 03/17/22 00:30 03/17/22 04:16 03/17/22 07:24  Glucose-Capillary 70 - 99 mg/dL 283 (H) 288 (H) 314 (H)    Latest Reference Range & Units 03/15/22 07:18 03/15/22 12:39 03/15/22 16:55 03/16/22 08:07 03/16/22 11:29 03/16/22 15:29 03/16/22 19:27  Glucose-Capillary 70 - 99 mg/dL 192 (H) 184 (H) 181 (H) 250 (H) 213 (H) 231 (H) 200 (H)   Review of Glycemic Control  Diabetes history: DM2 Outpatient Diabetes medications: Amaryl 4 mg QAM Current orders for Inpatient glycemic control: Amaryl 4 mg QAM, Novolog 0-15 units TID with melas; Decadron 4 mg Q8H, Glucerna @ 65 ml/hr  Inpatient Diabetes Program Recommendations:    Insulin: Please change Novolog correction frequency to Q4H since NPO and ordered tube feedings. If steroids are continued, please consider ordering Semglee 10 units Q24H. Please consider ordering Novolog 3 units Q4H for tube feeding coverage. If tube feeding is stopped or held then Novolog tube feeding coverage should also be stopped or held.  Thanks, Barnie Alderman, RN, MSN, [place] Diabetes Coordinator Inpatient Diabetes Program [phone] (Team Pager from 8am to [place])

## 2022-03-17 NOTE — Progress Notes (Signed)
NAME:  [name], MRN:  [id number], DOB:  28-Dec-1945, LOS: 6 ADMISSION DATE:  03/11/2022, CONSULTATION DATE:  03/15/22 REFERRING MD:  Dr. Ellene Route, CHIEF COMPLAINT:  worsening hypoxia  History of Present Illness:   76 year old male who with previous severe C3-C4 stenosis with prior decompression years prior with worsening left arm weakness with MRI demonstrating left sided cord compression secondary to ossification of the posterior longitudinal ligament creating significant cord compression on left side.  He was admitted to [facility] on 6/22 for C5 corpectomy and ACCF found to have increased weakness postoperatively, taken back to OR for evacuation of epidural hematoma and revision of anterior decompression and fixation from C4-C6.  Hospitalization complicated by ongoing dysphagia despite steroids.  Speech has been following and remains NPO except ice chips.  On 6/26, he has had increasing hypoxia with concerns for aspiration.  PCCM consulted for further recommendations.  Pertinent  Medical History  Severe cervical myelopathy, HTN, DM, chronic cough  Significant Hospital Events: Including procedures, antibiotic start and stop dates in addition to other pertinent events   6/22 admitted NSGY, OR C5 corpectomy/ ACCF> takeback to OR for epidural hematoma evac, decompression and C4-C6 fixation. 6/28 SLP reevaluated swallow with NGT in place and observed improvement in swallow seen but continued to recommend NGT as primary means of nutrition support   Interim History / Subjective:  States he feels well this am with no acute complaints   Objective   Blood pressure 138/87, pulse 77, temperature 97.9 F (36.6 C), temperature source Oral, resp. rate (!) 24, height 6' (1.829 m), weight 88.3 kg, SpO2 99 %.        Intake/Output Summary (Last 24 hours) at 03/17/2022 0740 Last data filed at 03/17/2022 0700 Gross per 24 hour  Intake 2701.51 ml  Output 1250 ml  Net 1451.51 ml    Filed Weights    03/11/22 0857 03/17/22 0500  Weight: 93 kg 88.3 kg    Examination:  General: Acute on chronically ill appearing elderly male lying in bed in NAD HEENT: [place]/AT, MM pink/moist, PERRL,  Neuro: Alert and oriented x3, non-focal  CV: s1s2 regular rate and rhythm, no murmur, rubs, or gallops,  PULM:  Clear to ascultation no increased work of breathing, no added breath sounds  GI: soft, bowel sounds active in all 4 quadrants, non-tender, non-distended, tolerating TF Extremities: warm/dry, no edema  Skin: no rashes or lesions  Resolved Hospital Problem list     Assessment & Plan:   Acute respiratory failure with hypoxia:  -S/p C5 corpectomy and then evacuation of epidural hematoma post op.  P: Weaned to 4L  with further room to wean, SPO2 goal > 92 Remains on Decadron, wean per NSGY  Aspiration precautions  Encourage pulmonary hygiene  Mobilize as able   Spondylitic myelopathy  -C 4-5, C 5-6. S/p ACCF and takeback for hematoma. Course complicated by dysphagia. P: Management per NSGY  Steroids as above   Nutrition -Unable to place cortrak last week, now unable to place NG. No significant nutrition for several days.  -Able to place [facility] 6/67. SLP reevaluated swallow with NGT in place and observed improvement in swallow seen but continued to recommend NGT as primary means of nutrition support  P: Continue TFs  SLP continues to follow   DM  P: Given continued need for TF will change SSI schedule to q4 Add TF coverage while dependent on TFs Add 10units Long acting insulin while on steroids  Continue to monitor CBG  closely for need to adjust regiment  CBG goal 140-180  Hypothyroidism P: Home Synthroid per NGT  HTN HLD P: Resume home Coreg  PRN IV lopressor  Continuous telemetry  Continue home Crestor   Patient is stable for transfer out of ICU and will ask TRH to assume medical consult starting 6/28  Best Practice (right click and "Reselect all SmartList  Selections" daily)   Diet/type: NPO DVT prophylaxis: not indicated post op defer to primary GI prophylaxis: PPI Lines: N/A Foley:  N/A Code Status:  full code Last date of multidisciplinary goals of care discussion: Updated patient and family daily   Critical care time:  NA  Camille Thau D. Kenton Kingfisher, NP-C [facility] Personal contact information can be found on Amion  03/17/2022, 7:41 AM

## 2022-03-17 NOTE — Progress Notes (Signed)
Speech Language Pathology Treatment Note Time: 1000-1030 SLP Time Calculation (min) (ACUTE ONLY): 30 min  Assessment / Plan / Recommendation Clinical Impression  Pt demonstrates excellent tolerance of nectar thick liquids. He is consistently using a throat clear to prevent aspiration. SLP assisted pt in consumed some semifrozen magic cup, which did seem to increase residue and subsequent need to clear throat or cough occasionally. Pt is ready to attempt purees and nectar more consistently on food trays. He is also tolerating meds crushed in puree. Will advance diet and follow up tomorrow.   HPI HPI: [name] is a 76 year old male who has had a previous a severe stenosis at C3-C4 which Dr Ellene Route decompressed years ago.  The patient had developed some increasing weakness in his left arm was particularly and the recent MRI demonstrated the presence of left-sided cord compression secondary to ossification of the posterior longitudinal ligament creating significant cord compression on that left side.   He had undergone decompression earlier on 6/22 with fixation from C4-C6, but postoperatively was noted to be weak. CT scan was performed and this demonstrated presence of some blood in the epidural space and some increased soft tissue swelling also suggested of blood and there was some evidence of residual bone still in the canal superiorly at the level of C4.  He was taken back to the operating room to undergo revision and evacuation. Hardware removed with more removal of bone with a longer cage placed. Pt observed to cough with pills given in am on 6/23.      SLP Plan  Continue with current plan of care      Recommendations for follow up therapy are one component of a multi-disciplinary discharge planning process, led by the attending physician.  Recommendations may be updated based on patient status, additional functional criteria and insurance authorization.    Recommendations  Diet  recommendations: Dysphagia 1 (puree);Nectar-thick liquid Liquids provided via: Cup;Straw Medication Administration: Crushed with puree Supervision: Staff to assist with self feeding Compensations: Slow rate;Small sips/bites;Clear throat after each swallow Postural Changes and/or Swallow Maneuvers: Seated upright 90 degrees                Follow Up Recommendations: Acute inpatient rehab (3hours/day) Assistance recommended at discharge: Frequent or constant Supervision/Assistance SLP Visit Diagnosis: Dysphagia, oropharyngeal phase (R13.12) Plan: Continue with current plan of care           [place] Gavitt, Katherene Ponto  03/17/2022, 11:12 AM

## 2022-03-17 NOTE — Progress Notes (Addendum)
Inpatient Rehab Admissions Coordinator:   I do not have a CIR bed for this Pt today. He is currently not at a level where I think a short stay on CIR will get him to supervision level (which is all he states wife can provide), so I am not actively pursuing an admission. Current plan is for SNF, but CIR will follow 1-2 more therapy sessions to see if he progresses to a level that supervision goals might be appropriate as he becomes more medically stable. If Pt. Ready for d/c prior to that, recommend d/c to SNF  Clemens Catholic, [place], [place] Admissions Coordinator  [phone] (celll) [phone] (office)

## 2022-03-17 NOTE — Progress Notes (Signed)
Patient ID: [name], male   DOB: Apr 26, 1946, 76 y.o.   MRN: [id number] Improving with swallow and overall doing better Start to wean decadron Ok for dvt prophylaxis Transfer to floor rehab

## 2022-03-17 NOTE — TOC Progression Note (Addendum)
Transition of Care [facility]) - Progression Note    Patient Details  Name: ROLLIE HYNEK MRN: [id number] Date of Birth: 05/14/1946  [facility]) CM/SW Contact  Oren Section Cleta Alberts, RN Phone Number: 03/17/2022, 11:58 AM  Clinical Narrative:    Noted CIR continues to follow, but no bed available currently.  Patient does have SNF bed offers; met with patient to discuss these, and left bed offer list at bedside.  Patient still has Cortrak, but appears to have passed for a diet today with SLP.  Patient defers to wife regarding SNF choice; called wife, [name], and there was no answer.  Unable to leave message due to voicemail being full.  TOC will continue to follow to assist with disposition.     Expected Discharge Plan: Skilled Nursing Facility Barriers to Discharge: SNF Pending bed offer  Expected Discharge Plan and Services Expected Discharge Plan: [place]     Post Acute Care Choice: [place]                                         Social Determinants of Health (SDOH) Interventions    Readmission Risk Interventions     No data to display         Reinaldo Raddle, RN, BSN  Trauma/Neuro ICU Case Manager [phone]

## 2022-03-17 NOTE — Progress Notes (Signed)
Physical Therapy Treatment Patient Details Name: [name] MRN: [id number] DOB: 08-10-1946 Today's Date: 03/17/2022   History of Present Illness 76 yo male presenting 6/22 for cervical corpectomy of C5 with undercutting of C4 and decompression of ossification of the posterior longitudinal ligament and the spinal canal from C3-C5. PMH includes: prior decompression of C3-4, HTN, DM II    PT Comments    Pt is demonstrating great progress with functional mobility today as he was able to progress to transferring to stand from the recliner > stedy with minAx2 on the first attempt today. In addition, he progressed to completing >/= 7x additional sit <> stand reps from the stedy flaps and x1 addition sit <> stand rep from the recliner at a min guard assist level. Pt was able to extend his hips and knees with increased reps and cuing. Pt began session with lower extremity exercises to warm-up and was encouraged to continue to perform them after the session to further improve his leg strength. Pt is very motivated to participate and improve, can tolerate 3 hours of therapy per day, and is demonstrating good progress with mobility, thus continuing to recommend AIR. Will continue to follow acutely.    Recommendations for follow up therapy are one component of a multi-disciplinary discharge planning process, led by the attending physician.  Recommendations may be updated based on patient status, additional functional criteria and insurance authorization.  Follow Up Recommendations  Acute inpatient rehab (3hours/day)     Assistance Recommended at Discharge Frequent or constant Supervision/Assistance  Patient can return home with the following Two people to help with walking and/or transfers;A lot of help with bathing/dressing/bathroom;Assistance with cooking/housework;Direct supervision/assist for medications management;Direct supervision/assist for financial management;Assist for transportation;Help  with stairs or ramp for entrance   Equipment Recommendations  Other (comment) (TBA)    Recommendations for Other Services       Precautions / Restrictions Precautions Precautions: Cervical Precaution Booklet Issued: Yes (comment) Precaution Comments: needs reminders to maintain Required Braces or Orthoses: Other Brace Other Brace: no brace needed per order set Restrictions Weight Bearing Restrictions: No     Mobility  Bed Mobility               General bed mobility comments: Pt up in recliner upon arrival.    Transfers Overall transfer level: Needs assistance Equipment used: Ambulation equipment used Transfers: Sit to/from Stand Sit to Stand: Min assist, +2 physical assistance, Min guard, +2 safety/equipment           General transfer comment: Pt requiring minAx2 to power up to stand and extend his hips the first rep from recliner to stedy, needing assistance to lift and place his L UE prior to the transfer. Pt performed >/= 7x additional sit <> stand reps from the stedy flaps with min guard assist and x1 additional sit <> stand from the recliner to the stedy with min guard assist. Cues provided for upright posture and knee and hip extension. Transfer via Lift Equipment: Stedy  Ambulation/Gait               General Gait Details: deferred to focus on transfer training   Stairs             Wheelchair Mobility    Modified Rankin (Stroke Patients Only)       Balance Overall balance assessment: Needs assistance Sitting-balance support: No upper extremity supported, Feet supported Sitting balance-Leahy Scale: Poor     Standing balance support: Bilateral upper extremity supported,  During functional activity Standing balance-Leahy Scale: Poor Standing balance comment: Reliant on UE support, standing >/= 9x in stedy, progressing from minA > min guard assist quickly.                            Cognition Arousal/Alertness:  Awake/alert Behavior During Therapy: WFL for tasks assessed/performed Overall Cognitive Status: Impaired/Different from baseline Area of Impairment: Attention, Following commands, Safety/judgement, Awareness, Problem solving                   Current Attention Level: Selective Memory: Decreased recall of precautions, Decreased short-term memory Following Commands: Follows one step commands with increased time Safety/Judgement: Decreased awareness of safety, Decreased awareness of deficits Awareness: Emergent Problem Solving: Slow processing, Decreased initiation, Difficulty sequencing, Requires verbal cues General Comments: Pt requiring extra time with occasional repetition of cues due to being distracted.        Exercises General Exercises - Lower Extremity Long Arc Quad: AROM, Strengthening, Both, Seated, Other reps (comment) (5-10 reps) Hip Flexion/Marching: AROM, Strengthening, Both, Other reps (comment), Seated (5-10 reps) Other Exercises Other Exercises: sit <> stand from recliner 2x and stedy >/= 7x    General Comments General comments (skin integrity, edema, etc.): VSS; RN present to assist PT this session      Pertinent Vitals/Pain Pain Assessment Pain Assessment: Faces Faces Pain Scale: Hurts little more Pain Location: buttocks/hips with sitting, generalized with movement Pain Descriptors / Indicators: Grimacing, Sore Pain Intervention(s): Limited activity within patient's tolerance, Monitored during session, Repositioned    Home Living                          Prior Function            PT Goals (current goals can now be found in the care plan section) Acute Rehab PT Goals Patient Stated Goal: to go to AIR PT Goal Formulation: With patient Time For Goal Achievement: 03/26/22 Potential to Achieve Goals: Good Progress towards PT goals: Progressing toward goals    Frequency    Min 5X/week      PT Plan Current plan remains  appropriate;Equipment recommendations need to be updated    Co-evaluation              AM-PAC PT "6 Clicks" Mobility   Outcome Measure  Help needed turning from your back to your side while in a flat bed without using bedrails?: A Little Help needed moving from lying on your back to sitting on the side of a flat bed without using bedrails?: A Little Help needed moving to and from a bed to a chair (including a wheelchair)?: Total Help needed standing up from a chair using your arms (e.g., wheelchair or bedside chair)?: A Lot Help needed to walk in hospital room?: Total Help needed climbing 3-5 steps with a railing? : Total 6 Click Score: 11    End of Session Equipment Utilized During Treatment: Gait belt Activity Tolerance: Patient tolerated treatment well Patient left: in chair;with call bell/phone within reach;with nursing/sitter in room Nurse Communication: Mobility status;Need for lift equipment PT Visit Diagnosis: Unsteadiness on feet (R26.81);Other abnormalities of gait and mobility (R26.89);History of falling (Z91.81);Muscle weakness (generalized) (M62.81);Difficulty in walking, not elsewhere classified (R26.2);Other symptoms and signs involving the nervous system (N47.096)     Time: 2836-6294 PT Time Calculation (min) (ACUTE ONLY): 30 min  Charges:  $Therapeutic Exercise: 8-22 mins $Therapeutic Activity: 8-22 mins  Moishe Spice, PT, DPT Acute Rehabilitation Services  Office: [place] 03/17/2022, 5:16 PM

## 2022-03-18 ENCOUNTER — Inpatient Hospital Stay (HOSPITAL_COMMUNITY): Payer: HMO

## 2022-03-18 ENCOUNTER — Telehealth: Payer: HMO | Admitting: Internal Medicine

## 2022-03-18 DIAGNOSIS — M4712 Other spondylosis with myelopathy, cervical region: Secondary | ICD-10-CM | POA: Diagnosis not present

## 2022-03-18 DIAGNOSIS — J9601 Acute respiratory failure with hypoxia: Secondary | ICD-10-CM

## 2022-03-18 LAB — GLUCOSE, CAPILLARY
Glucose-Capillary: 107 mg/dL — ABNORMAL HIGH (ref 70–99)
Glucose-Capillary: 150 mg/dL — ABNORMAL HIGH (ref 70–99)
Glucose-Capillary: 166 mg/dL — ABNORMAL HIGH (ref 70–99)
Glucose-Capillary: 174 mg/dL — ABNORMAL HIGH (ref 70–99)
Glucose-Capillary: 174 mg/dL — ABNORMAL HIGH (ref 70–99)
Glucose-Capillary: 186 mg/dL — ABNORMAL HIGH (ref 70–99)
Glucose-Capillary: 328 mg/dL — ABNORMAL HIGH (ref 70–99)

## 2022-03-18 MED ORDER — NEPRO/CARBSTEADY PO LIQD
237.0000 mL | Freq: Three times a day (TID) | ORAL | Status: DC
  Administered 2022-03-18: 237 mL via ORAL
  Administered 2022-03-18: 120 mL via ORAL
  Administered 2022-03-19 – 2022-03-23 (×10): 237 mL via ORAL

## 2022-03-18 MED ORDER — GLUCERNA 1.5 CAL PO LIQD
1000.0000 mL | ORAL | Status: DC
Start: 2022-03-18 — End: 2022-03-22
  Administered 2022-03-18 – 2022-03-21 (×3): 1000 mL
  Filled 2022-03-18 (×5): qty 1000

## 2022-03-18 NOTE — Progress Notes (Signed)
Patient ID: [name], male   DOB: 1946-06-11, 76 y.o.   MRN: [id number] Vital signs are stable Patient however complains that he feels weaker particularly with his left arm He is having less deltoid function than he had previously on examination biceps function is at 3 out of 5 He is not able to use it for assisting to feed himself His right arm appears stable lower extremity strength appears unchanged His swallowing is improving and his diet has been advanced Hopefully he can get the feeding tube out soon I will order an MRI of the cervical spine This will give [place] a follow-up study of his cervical decompression

## 2022-03-18 NOTE — Progress Notes (Signed)
Inpatient Rehabilitation Admissions Coordinator   I met at bedside with patient , contacted his wife, [name] and son, Elven by phone. Family can not provide the needed caregiver supports he will need after a CIR admit. He will need SNF and they are in agreement. I have alerted acute team and will sign off.  Danne Baxter, RN, MSN Rehab Admissions Coordinator [phone] 03/18/2022 11:20 AM

## 2022-03-18 NOTE — Assessment & Plan Note (Signed)
Lasix, coreg Follow BP

## 2022-03-18 NOTE — Progress Notes (Signed)
Consult NOTE    [name]  NTI:[id number] DOB: August 04, 1946 DOA: 03/11/2022 PCP: Renee Rival, FNP  No chief complaint on file.   Brief Narrative:  76 year old male who with previous severe C3-C4 stenosis with prior decompression years prior with worsening left arm weakness with MRI demonstrating left sided cord compression secondary to ossification of the posterior longitudinal ligament creating significant cord compression on left side.  He was admitted to [facility] on 6/22 for C5 corpectomy and ACCF found to have increased weakness postoperatively, taken back to OR for evacuation of epidural hematoma and revision of anterior decompression and fixation from C4-C6.  Hospitalization complicated by ongoing dysphagia despite steroids.  Speech has been following and remains NPO except ice chips.  On 6/26, he has had increasing hypoxia with concerns for aspiration.  PCCM consulted for further recommendations.  Significant events 6/22 admitted NSGY, OR C5 corpectomy/ ACCF> takeback to OR for epidural hematoma evac, decompression and C4-C6 fixation. 6/28 SLP reevaluated swallow with NGT in place and observed improvement in swallow seen but continued to recommend NGT as primary means of nutrition support  6/29 [facility] consulting for medical issues    Assessment & Plan:   Principal Problem:   Cervical spondylosis with myelopathy Active Problems:   Acute respiratory failure with hypoxia (HCC)   Dysphagia   Hypertension   Hyperlipidemia   Type 2 diabetes mellitus (HCC)   Hypothyroidism   Protein-calorie malnutrition, severe   Assessment and Plan: * Cervical spondylosis with myelopathy S/p C5 cervical corpectomy with undercutting of C4 and decompression of ossification of the posterior longitudinal ligament an the spinal canal from C3-5.  Reconstruction with titanium spacer and autograft arthrodesis C4 to C6 anterior plate fixation from Q6-P6 on 1/95 Complicated by hematoma with increased  weakness after surgical corpectomy C5 Increased weakness to L arm today, repeat MRI C spine per nsgy (MRI motion degraded, preverebral collection which extends into the L neck soft tissues (at least moderate narrowing of aerodigestive tract).  Prior ACDF at C3-6 levels and prior C5 corpectomy.  ACDF hardware spans C4-C6 levels, Damary Doland corpectomy device is present at C5.  C4-C5 there is 8 mm T2 hypointense focus within L aspect of spinal canal. Up to moderate spinal canal stenosis at remaining levels.  Per nsg   Acute respiratory failure with hypoxia (HCC) Occurred post op  Had hematoma with increased weakness after surgical corpectomy C5 6/22 underwent evacuation of surgical hematoma and revision of anterior decompression fixation from C5 corpectomy with fixation from C4-6 PCCM consulted 6/26 with concern for worsening hypoxia and concern for aspiration Currently improved on RA, decadron per neurosurgery    Dysphagia Nocturnal feeds Follow PO intake Has feeding tube for now  Hyperlipidemia crestor  Hypertension Lasix, coreg Follow BP  Type 2 diabetes mellitus (HCC) Basal/bolus regimen ordered Will hold glimeperide while inpatient Watch while on steroids  Hypothyroidism synthroid  Protein-calorie malnutrition, severe Noted, RD      DVT prophylaxis: lovenox Code Status: full Family Communication: none Disposition:   Per [place]   Consultants:  Hospitalist PCCM  Procedures:  Cervical corpectomy of C5 with undercutting of C4 and decompression of ossification of the posterior longitudinal ligament and the spinal canal from C3-C5.  Reconstruction with titanium spacer and autograft arthrodesis see 4 to see 6 anterior plate fixation from K9-T2   Evacuation of surgical hematoma and revision of anterior decompression fixation from C5 corpectomy with fixation from C4-C6 Antimicrobials:  Anti-infectives (From admission, onward)    Start  Dose/Rate Route Frequency Ordered  Stop   03/12/22 0400  ceFAZolin (ANCEF) IVPB 2g/100 mL premix        2 g 200 mL/hr over 30 Minutes Intravenous Every 8 hours 03/11/22 1818 03/12/22 1211   03/12/22 0000  ceFAZolin (ANCEF) IVPB 2g/100 mL premix  Status:  Discontinued        2 g 200 mL/hr over 30 Minutes Intravenous Every 8 hours 03/11/22 2308 03/11/22 2315   03/11/22 0915  ceFAZolin (ANCEF) IVPB 2g/100 mL premix        2 g 200 mL/hr over 30 Minutes Intravenous On call to O.R. 03/11/22 0910 03/11/22 1642   03/11/22 0914  ceFAZolin (ANCEF) 2-4 GM/100ML-% IVPB       Note to Pharmacy: [name] O: cabinet override      03/11/22 0914 03/11/22 1313       Subjective: Feels weaker in arms  Objective: Vitals:   03/18/22 0320 03/18/22 0420 03/18/22 1201 03/18/22 1632  BP: (!) 160/63  (!) 157/79 125/65  Pulse: 84  81 79  Resp: 19     Temp: 98.1 F (36.7 C)  (!) 97.5 F (36.4 C) 98.2 F (36.8 C)  TempSrc: Axillary  Oral Oral  SpO2: 93%  96% 97%  Weight:  88.6 kg    Height:        Intake/Output Summary (Last 24 hours) at 03/18/2022 1757 Last data filed at 03/18/2022 1400 Gross per 24 hour  Intake 1735.83 ml  Output 1950 ml  Net -214.17 ml   Filed Weights   03/11/22 0857 03/17/22 0500 03/18/22 0420  Weight: 93 kg 88.3 kg 88.6 kg    Examination:  General exam: Appears calm and comfortable  Dressing to neck, swollen Respiratory system: unlabored Cardiovascular system: RRR Gastrointestinal system: Abdomen is nondistended, soft and nontender. NG in place Central nervous system: LUE weakness Extremities: no LEE edema   Data Reviewed: I have personally reviewed following labs and imaging studies  CBC: Recent Labs  Lab 03/11/22 2058 03/12/22 0150  WBC  --  9.8  HGB 9.5* 10.3*  HCT 28.0* 30.9*  MCV  --  99.7  PLT  --  419    Basic Metabolic Panel: Recent Labs  Lab 03/11/22 2058 03/12/22 0150 03/14/22 0433 03/15/22 0330 03/16/22 1453 03/17/22 0603 03/17/22 1737  NA 138 137 133* 138  --    --   --   K 4.2 4.0 3.9 3.4*  --   --   --   CL 101 102 100 100  --   --   --   CO2  --  '24 24 25  '$ --   --   --   GLUCOSE 215* 245* 189* 167*  --   --   --   BUN '13 14 13 19  '$ --   --   --   CREATININE 0.60* 0.79 0.72 0.71  --   --   --   CALCIUM  --  8.5* 8.6* 8.8*  --   --   --   MG  --   --   --   --  2.0 2.2 1.9  PHOS  --   --  2.4* 2.3* 2.3* 3.2 2.2*    GFR: Estimated Creatinine Clearance: 83.5 mL/min (by C-G formula based on SCr of 0.71 mg/dL).  Liver Function Tests: Recent Labs  Lab 03/14/22 0433 03/15/22 0330  ALBUMIN 3.4* 3.2*    CBG: Recent Labs  Lab 03/18/22 0008 03/18/22 0335 03/18/22 0955 03/18/22  1202 03/18/22 1630  GLUCAP 166* 186* 174* 328* 150*     Recent Results (from the past 240 hour(s))  MRSA Next Gen by PCR, Nasal     Status: None   Collection Time: 03/11/22 11:03 PM   Specimen: Nasal Mucosa; Nasal Swab  Result Value Ref Range Status   MRSA by PCR Next Gen NOT DETECTED NOT DETECTED Final    Comment: (NOTE) The GeneXpert MRSA Assay (FDA approved for NASAL specimens only), is one component of [facility] comprehensive MRSA colonization surveillance program. It is not intended to diagnose MRSA infection nor to guide or monitor treatment for MRSA infections. Test performance is not FDA approved in patients less than 63 years old. Performed at [facility], [place] [address] [place], [place] [address]          Radiology Studies: MR CERVICAL SPINE WO CONTRAST  Result Date: 03/18/2022 CLINICAL DATA:  Myelopathy, chronic, cervical spine. Additional history provided by scanning technologist: Patient reports left-sided weakness. EXAM: MRI CERVICAL SPINE WITHOUT CONTRAST TECHNIQUE: Multiplanar, multisequence MR imaging of the cervical spine was performed. No intravenous contrast was administered. COMPARISON:  Intraoperative radiographs of the cervical spine 03/11/2022. Cervical spine CT 03/11/2022. Cervical spine MRI 02/17/2022. FINDINGS: Intermittently  motion degraded examination, limiting evaluation. Most notably, there is mild-to-moderate motion degradation of the sagittal T2 TSE sequence, moderate to severe motion degradation of the least motion degraded axial T2 TSE sequence and severe motion degradation of the axial T2 GRE sequence. Alignment: Straightening of the expected cervical lordosis. Trace grade 1 anterolisthesis at C7-T1 and T1-T2. Vertebrae: Susceptibility artifact arising from ACDF hardware at the C4-C6 levels, and arising from Jazman Reuter C5 corpectomy device. No appreciable significant marrow edema or focal suspicious osseous lesion. Facet joint ankylosis on the left at C2-C3 and bilaterally at C3-C4, better appreciated on the prior CT. Cord: Redemonstrated focus of chronic myelomalacia within the spinal cord at the C3-C4 level. Within described limitations, no signal abnormality is appreciated within the spinal cord elsewhere. Posterior Fossa, vertebral arteries, paraspinal tissues: No abnormality identified within included portions of the posterior fossa. Flow voids preserved within the imaged cervical vertebral arteries. T2 hyperintense and T1 hypointense collection with prevertebral components, as well as components extending into the left neck soft tissues. The prevertebral component spans the C2-C6 levels and measures up to 1.5 cm in AP dimension. The component more laterally within the left neck soft tissues measures 3.2 x 5.8 cm (series 5, image 15) (series 11, images 9-23). The prevertebral component results in at least moderate narrowing of the aerodigestive tract. Disc levels: Disc degeneration at the non operative levels, greatest at C6-C7 (advanced) and C7-T1 (moderate to moderately advanced). Generally narrow cervical spinal canal. C2-C3: Shallow disc bulge. Uncovertebral hypertrophy on the left. Facet arthrosis/hypertrophy. No significant spinal canal stenosis. Bilateral neural foraminal narrowing (mild right, moderate left). C3-C4:  Vertebral ankylosis. Uncovertebral hypertrophy. Facet and ligamentum flavum hypertrophy. Mild-to-moderate spinal canal stenosis. Bilateral neural foraminal narrowing (moderate right, severe left). C4-C5: Prior ACDF and C5 corpectomy. Uncovertebral hypertrophy.8 mm T2 hypointense focus within the left anterior aspect of the spinal canal, likely corresponding with the bone fragment identified at this site on the prior CT cervical spine of 03/11/2022. Facet arthrosis. The bone fragment contributes to persistent moderate/severe canal stenosis on the left with some flattening of the ventral spinal cord (series 9, image 19). Apparent severe bilateral neural foraminal narrowing, although motion degradation significantly limits evaluation. C5-C6: Prior ACDF and C5 corpectomy. Uncovertebral hypertrophy. Facet arthrosis and ligamentum  flavum hypertrophy. Moderate spinal canal stenosis. Apparent moderate bilateral neural foraminal narrowing, although motion degradation limits evaluation. C6-C7: Broad-based central disc protrusion. Bilateral disc osteophyte ridge/uncinate hypertrophy. Facet arthrosis and ligamentum flavum thickening. Ossification of the posterior longitudinal ligament. The disc protrusion contributes to apparent moderate spinal canal stenosis, contacting and mildly flattening of the ventral spinal cord. Bilateral neural foraminal narrowing (mild right, moderate left). C7-T1: Trace grade 1 anterolisthesis. Slight disc bulge. Facet arthrosis and ligamentum flavum thickening. No significant spinal canal stenosis or neural foraminal narrowing. Impressions 1, 2 and 4 will be called to the ordering clinician or representative by the Radiologist Assistant, and communication documented in the PACS or [employer]. IMPRESSION: 1. Significantly motion degraded examination, as described and limiting evaluation. 2. Prevertebral collection, which also extends into the left neck soft tissues. The prevertebral component  of the collection spans the C2-C6 levels and measures up to 1.5 cm in AP dimension. The component of the collection within the left neck soft tissues measures 3.2 cm in AP dimension and 5.8 cm in craniocaudal dimension. The prevertebral component results in at least moderate narrowing of the aerodigestive tract. 3. Prior ACDF at the C3-C6 levels and prior C5 corpectomy. ACDF hardware spans the C4-C6 levels, and Rina Adney corpectomy device is present at C5. 4. At C4-C5, there is an 8 mm T2 hypointense focus within the left aspect of the spinal canal which likely corresponds with the bone fragment identified at this site on the prior CT of 03/11/2022. This bone fragment contributes to persistent moderate/severe spinal canal stenosis to the left, with some flattening of the left aspect of the spinal cord. 5. Up to moderate spinal canal stenosis at the remaining levels. Multilevel neural foraminal stenosis, as detailed. Electronically Signed   By: Kellie Simmering D.O.   On: 03/18/2022 16:38        Scheduled Meds:  azelastine  1 spray Each Nare Q1500   carvedilol  3.125 mg Oral BID WC   Chlorhexidine Gluconate Cloth  6 each Topical Daily   dexamethasone  4 mg Oral Q12H   docusate  100 mg Per Tube BID   enoxaparin (LOVENOX) injection  40 mg Subcutaneous Q24H   feeding supplement (GLUCERNA 1.5 CAL)  1,000 mL Per Tube Q24H   feeding supplement (NEPRO CARB STEADY)  237 mL Oral TID BM   feeding supplement (PROSource TF)  45 mL Per Tube Daily   fluticasone  1 spray Each Nare Daily   furosemide  40 mg Oral BID   gabapentin  100 mg Oral QHS   insulin aspart  0-15 Units Subcutaneous Q4H   insulin aspart  3 Units Subcutaneous Q4H   insulin glargine-yfgn  10 Units Subcutaneous QHS   levothyroxine  88 mcg Oral Q0600   mouth rinse  15 mL Mouth Rinse 4 times per day   pantoprazole sodium  40 mg Oral QHS   potassium chloride  20 mEq Oral Daily   rOPINIRole  0.5 mg Oral TID   rosuvastatin  20 mg Oral Daily   senna  1  tablet Oral BID   sodium chloride  2 spray Each Nare BID   sodium chloride flush  3 mL Intravenous Q12H   Continuous Infusions:  sodium chloride     methocarbamol (ROBAXIN) IV       LOS: 7 days    Time spent: over 30 min    Fayrene Helper, MD [facility]   To contact the attending provider between 7A-7P or the covering provider  during after hours 7P-7A, please log into the web site [email or web address] and access using universal [place] password for that web site. If you do not have the password, please call the hospital operator.  03/18/2022, 5:57 PM

## 2022-03-18 NOTE — Assessment & Plan Note (Signed)
Noted, RD

## 2022-03-18 NOTE — TOC Progression Note (Addendum)
Transition of Care [facility]) - Progression Note    Patient Details  Name: [name] MRN: [id number] Date of Birth: 07/04/1946  Transition of Care [facility]) CM/SW [place], [place] Phone Number: 03/18/2022, 3:42 PM  Clinical Narrative:   CSW alerted by rehab admissions that patient will need SNF, and family already knows where they would like patient to go. CSW contacted patient's wife, [name], by phone to discuss, and patient would prefer to go to [facility], as he has been there before. [facility] has offered a bed for when patient is medically ready. CSW to follow.    Expected Discharge Plan: Skilled Nursing Facility Barriers to Discharge: Continued Medical Work up, Orthoptist and Services Expected Discharge Plan: [place]     Post Acute Care Choice: [place]                                         Social Determinants of Health (SDOH) Interventions    Readmission Risk Interventions     No data to display

## 2022-03-18 NOTE — Care Management Important Message (Signed)
Important Message  Patient Details  Name: [name] MRN: [id number] Date of Birth: 10/01/1945   Medicare Important Message Given:  Yes     Orbie Pyo 03/18/2022, 2:39 PM

## 2022-03-18 NOTE — Progress Notes (Signed)
Patient ID: [name], male   DOB: July 01, 1946, 76 y.o.   MRN: [id number] Mri reviewed looks like persistent spurring on left side of canal by mri .However I will repeat CT of neck as patient had aggressive corpectomy done on return to OR last week/. Could this possibly  represent something else?

## 2022-03-18 NOTE — Assessment & Plan Note (Signed)
[facility]

## 2022-03-18 NOTE — Progress Notes (Signed)
Speech Language Pathology Treatment: Dysphagia  Patient Details Name: CAREEM YASUI MRN: [id number] DOB: 05-18-1946 Today's Date: 03/18/2022 Time: 4967-5916 SLP Time Calculation (min) (ACUTE ONLY): 44 min  Assessment / Plan / Recommendation Clinical Impression  Pt seen up in chair with am meal, very pleasant, happy about his hard work with PT this am. He is having some trouble feeding himself due to some weakness in UE today; says he woke up stiffer in his arms (he also had BP cuff on right arm, which has restricted him a bit in prior visits with self feeding). SLP did not cue pt about throat clearing and he did not do any preventative throat clearing during session, only one spontaneous throat clear after trying pureed sausage. No signs of aspiration and given pts presentation and MBS do not feel that preventative throat clearing is still needed. Pt reports that he no longer senses stasis with pureed solids. Is masticating well with dentures in place. Consumed 60% of meal with assisted feeding of solids, self feeding of liquids in a lidded cup with straw. Pt ready to attempt mechanical soft solids, using his discretion to avoid very firm or tough foods. SLP assisted pt in ordering next two meals. Will f/u for tolerance. Keep NG tube until decadron completely weaned for a few days.    HPI HPI: Beacher Every is a 76 year old male who has had a previous a severe stenosis at C3-C4 which Dr Ellene Route decompressed years ago.  The patient had developed some increasing weakness in his left arm was particularly and the recent MRI demonstrated the presence of left-sided cord compression secondary to ossification of the posterior longitudinal ligament creating significant cord compression on that left side.   He had undergone decompression earlier on 6/22 with fixation from C4-C6, but postoperatively was noted to be weak. CT scan was performed and this demonstrated presence of some blood in the epidural space and  some increased soft tissue swelling also suggested of blood and there was some evidence of residual bone still in the canal superiorly at the level of C4.  He was taken back to the operating room to undergo revision and evacuation. Hardware removed with more removal of bone with a longer cage placed. Pt observed to cough with pills given in am on 6/23.      SLP Plan  Continue with current plan of care      Recommendations for follow up therapy are one component of a multi-disciplinary discharge planning process, led by the attending physician.  Recommendations may be updated based on patient status, additional functional criteria and insurance authorization.    Recommendations  Diet recommendations: Nectar-thick liquid Liquids provided via: Straw Medication Administration: Crushed with puree Supervision: Staff to assist with self feeding Compensations: Slow rate;Small sips/bites;Follow solids with liquid                Follow Up Recommendations: Acute inpatient rehab (3hours/day) Assistance recommended at discharge: Frequent or constant Supervision/Assistance SLP Visit Diagnosis: Dysphagia, oropharyngeal phase (R13.12) Plan: Continue with current plan of care           Yassmine Tamm, Katherene Ponto  03/18/2022, 10:55 AM

## 2022-03-18 NOTE — Hospital Course (Signed)
76 year old male who with previous severe C3-C4 stenosis with prior decompression years prior with worsening left arm weakness with MRI demonstrating left sided cord compression secondary to ossification of the posterior longitudinal ligament creating significant cord compression on left side.  He was admitted to [facility] on 6/22 for C5 corpectomy and ACCF found to have increased weakness postoperatively, taken back to OR for evacuation of epidural hematoma and revision of anterior decompression and fixation from C4-C6.  Hospitalization complicated by ongoing dysphagia despite steroids.  Speech has been following and remains NPO except ice chips.  On 6/26, he has had increasing hypoxia with concerns for aspiration.  PCCM consulted for further recommendations.  Significant events 6/22 admitted NSGY, OR C5 corpectomy/ ACCF> takeback to OR for epidural hematoma evac, decompression and C4-C6 fixation. 6/28 SLP reevaluated swallow with NGT in place and observed improvement in swallow seen but continued to recommend NGT as primary means of nutrition support  6/29 [facility] consulting for medical issues

## 2022-03-18 NOTE — Progress Notes (Signed)
Physical Therapy Treatment Patient Details Name: [name] MRN: [id number] DOB: 05/23/1946 Today's Date: 03/18/2022   History of Present Illness 76 yo male presenting 6/22 for cervical corpectomy of C5 with undercutting of C4 and decompression of ossification of the posterior longitudinal ligament and the spinal canal from C3-C5. PMH includes: prior decompression of C3-4, HTN, DM II    PT Comments    Pt is continuing to make good, gradual progress with mobility as he was able to stand from a slightly elevated EOB (due to tall stature) and take a few side steps to transfer to recliner using a RW today, all at a min guard assist level. Despite pt's persistent desire to progress further and attempt standing again, he was limited in further attempts at standing mobility due to lightheadedness with attempts to pull up to unsupported sitting in recliner, see General Comments below in regards to vitals. Completed session with x30 reps of LAQs and seated marching with encouragement to continue these exercises and AAROM UE exercises to further improve his strength and mobility. Pt continues to display good progress with mobility, an ability to withstand 3 hours of therapy a day, and great motivation to participate and progress, thus continue to recommend AIR. Will continue to follow acutely.     Recommendations for follow up therapy are one component of a multi-disciplinary discharge planning process, led by the attending physician.  Recommendations may be updated based on patient status, additional functional criteria and insurance authorization.  Follow Up Recommendations  Acute inpatient rehab (3hours/day)     Assistance Recommended at Discharge Frequent or constant Supervision/Assistance  Patient can return home with the following A lot of help with bathing/dressing/bathroom;Assistance with cooking/housework;Direct supervision/assist for medications management;Direct supervision/assist for  financial management;Assist for transportation;Help with stairs or ramp for entrance;A lot of help with walking and/or transfers   Equipment Recommendations  Other (comment) (TBA)    Recommendations for Other Services       Precautions / Restrictions Precautions Precautions: Cervical Precaution Booklet Issued: Yes (comment) Precaution Comments: needs reminders to maintain Required Braces or Orthoses: Other Brace Other Brace: no brace needed per order set Restrictions Weight Bearing Restrictions: No     Mobility  Bed Mobility Overal bed mobility: Needs Assistance Bed Mobility: Rolling, Sidelying to Sit Rolling: Min assist Sidelying to sit: Mod assist, HOB elevated       General bed mobility comments: Pt cued to log roll to R, needing minA for L hand to pull on PT and to bring legs to edge. ModA to ascend trunk due to shoulder pain today.    Transfers Overall transfer level: Needs assistance Equipment used: Rolling walker (2 wheels) Transfers: Sit to/from Stand, Bed to chair/wheelchair/BSC Sit to Stand: Min guard, From elevated surface Stand pivot transfers: Min guard         General transfer comment: EOB elevated some due to tall stature, but not maximally elevated. Pt cued to assist L UE onto RW with his R and push up from bed with his R UE. Pt requiring extra time and repeated rocking attempts to gain momentum to stand from EOB with min guard assist. Pt cued to improve upright posture, push up through RW, and increase feet clearance with stand step to R bed > recliner, min guard assist, no LOB. Attempted additional sit > stand from recliner but pt began to get repeatedly lightheaded with pulling self forward to sit up and try to push up to stand that he was unable to stand  again at this time. BP seemed stable, see General Comments.    Ambulation/Gait Ambulation/Gait assistance: Min guard Gait Distance (Feet): 2 Feet Assistive device: Rolling walker (2 wheels) Gait  Pattern/deviations: Step-through pattern, Decreased stride length, Decreased dorsiflexion - left, Knee flexed in stance - right, Knee flexed in stance - left, Shuffle, Narrow base of support, Decreased dorsiflexion - right, Trunk flexed Gait velocity: reduced Gait velocity interpretation: <1.31 ft/sec, indicative of household ambulator   General Gait Details: Pt with flexed posture, needing cues to push up through RW and correct. Pt with poor feet clearance, often sliding feet to R bed > recliner, able to correct with cues and increased effort. No LOB, min guard for safety. Further gait limited by lightheadedness.   Stairs             Wheelchair Mobility    Modified Rankin (Stroke Patients Only)       Balance Overall balance assessment: Needs assistance Sitting-balance support: No upper extremity supported, Feet supported Sitting balance-Leahy Scale: Fair Sitting balance - Comments: Able to sit statically EOB without LOB or need for assistance.   Standing balance support: Bilateral upper extremity supported, During functional activity Standing balance-Leahy Scale: Poor Standing balance comment: Reliant on UE support                            Cognition Arousal/Alertness: Awake/alert Behavior During Therapy: WFL for tasks assessed/performed Overall Cognitive Status: Impaired/Different from baseline Area of Impairment: Problem solving, Memory                     Memory: Decreased recall of precautions       Problem Solving: Slow processing, Decreased initiation, Difficulty sequencing, Requires verbal cues General Comments: Pt with good memory of PT from yesterday's session and able to deirect PT in ways he would like PT to assist him. Pt needing cues at times to problem-solve weight shifting to scoot on EOB and for hand placement. Needs reminders for cervical precautions compliance.        Exercises General Exercises - Lower Extremity Long Arc Quad:  AROM, Strengthening, Both, Seated, Other reps (comment) (x30 reps) Hip Flexion/Marching: AROM, Strengthening, Both, Other reps (comment), Seated (x30 reps) Other Exercises Other Exercises: encouraged pt to perform AAROM L shoulder flexion using his R UE while seated in chair, but pt reporting inability to lift at this time due to fatigue    General Comments General comments (skin integrity, edema, etc.): BP 160s/60s sitting after transfer to chair, 153/77 sitting after pulling forward in chair and pt became lightheaded, 161/74 sitting back in chair after attempts to push up to stand again and pt lightheaded; HR in 80s while in chair      Pertinent Vitals/Pain Pain Assessment Pain Assessment: Faces Faces Pain Scale: Hurts little more Pain Location: shoulders (R>L) Pain Descriptors / Indicators: Grimacing, Sore Pain Intervention(s): Limited activity within patient's tolerance, Monitored during session, Repositioned    Home Living                          Prior Function            PT Goals (current goals can now be found in the care plan section) Acute Rehab PT Goals Patient Stated Goal: to go to AIR PT Goal Formulation: With patient Time For Goal Achievement: 03/26/22 Potential to Achieve Goals: Good Progress towards PT goals: Progressing toward goals  Frequency    Min 5X/week      PT Plan Current plan remains appropriate;Equipment recommendations need to be updated    Co-evaluation              AM-PAC PT "6 Clicks" Mobility   Outcome Measure  Help needed turning from your back to your side while in a flat bed without using bedrails?: A Little Help needed moving from lying on your back to sitting on the side of a flat bed without using bedrails?: A Little Help needed moving to and from a bed to a chair (including a wheelchair)?: A Little Help needed standing up from a chair using your arms (e.g., wheelchair or bedside chair)?: A Little Help needed to  walk in hospital room?: Total Help needed climbing 3-5 steps with a railing? : Total 6 Click Score: 14    End of Session Equipment Utilized During Treatment: Gait belt Activity Tolerance: Patient tolerated treatment well Patient left: in chair;with call bell/phone within reach;with chair alarm set Nurse Communication: Mobility status;Need for lift equipment;Other (comment) (BP) PT Visit Diagnosis: Unsteadiness on feet (R26.81);Other abnormalities of gait and mobility (R26.89);History of falling (Z91.81);Muscle weakness (generalized) (M62.81);Difficulty in walking, not elsewhere classified (R26.2);Other symptoms and signs involving the nervous system (R29.898) Pain - Right/Left:  (bil) Pain - part of body: Shoulder     Time: 8144-8185 PT Time Calculation (min) (ACUTE ONLY): 46 min  Charges:  $Therapeutic Exercise: 8-22 mins $Therapeutic Activity: 23-37 mins                     Moishe Spice, PT, DPT Acute Rehabilitation Services  Office: [place] 03/18/2022, 9:20 AM

## 2022-03-18 NOTE — Assessment & Plan Note (Addendum)
Nocturnal feeds Follow PO intake NG d/c'd

## 2022-03-18 NOTE — Assessment & Plan Note (Addendum)
Occurred post op  Had hematoma with increased weakness after surgical corpectomy C5 6/22 underwent evacuation of surgical hematoma and revision of anterior decompression fixation from C5 corpectomy with fixation from C4-6 PCCM consulted 6/26 with concern for worsening hypoxia and concern for aspiration Currently improved on RA, decadron per neurosurgery

## 2022-03-18 NOTE — Assessment & Plan Note (Addendum)
S/p C5 cervical corpectomy with undercutting of C4 and decompression of ossification of the posterior longitudinal ligament an the spinal canal from C3-5.  Reconstruction with titanium spacer and autograft arthrodesis C4 to C6 anterior plate fixation from F5-P7 on 9/43 Complicated by hematoma with increased weakness after surgical corpectomy C5 Increased weakness to L arm today, repeat MRI C spine per nsgy (MRI motion degraded, preverebral collection which extends into the L neck soft tissues (at least moderate narrowing of aerodigestive tract).  Prior ACDF at C3-6 levels and prior C5 corpectomy.  ACDF hardware spans C4-C6 levels, Othel Hoogendoorn corpectomy device is present at C5.  C4-C5 there is 8 mm T2 hypointense focus within L aspect of spinal canal. Up to moderate spinal canal stenosis at remaining levels.  C spine Ct this AM with revision of C4-5 corpectomy and fusion, smaller residual bone fragment in the L C4 canal.  Interval stable C5-6 corpectomy and fusion appearance.  Underlying C2-3 and C3-4 ankylosis.  Chronic C6-7 and C7-T1 degeneration.  Postoperative prevertebral fluid collection, likely seroma.    Per nsg

## 2022-03-18 NOTE — Assessment & Plan Note (Signed)
synthroid °

## 2022-03-18 NOTE — Progress Notes (Addendum)
Nutrition Follow-up  DOCUMENTATION CODES:  Severe malnutrition in context of chronic illness  INTERVENTION:  Recommend adjusting TF to the following to allow a break from infusion and encourage daytime hunger. This will meet ~75% of estimated needs. Glucerna 1.5 @ 64m/h x 15h (10851md) Prosource TF 1x/d (40kcal and 11g of protein) This provides 1728 kcal, 104g of protein, and 85481mf free water Nepro Shake po TID, each supplement provides 425 kcal and 19 grams protein Continue current diet as ordered per SLP Nursing to assist with tray setup and opening containers/condiments  NUTRITION DIAGNOSIS:  Severe Malnutrition related to chronic illness as evidenced by severe fat depletion, severe muscle depletion, percent weight loss. - remains applicable  GOAL:  Patient will meet greater than or equal to 90% of their needs - progressing, diet advanced  MONITOR:  TF tolerance, Diet advancement, PO intake  REASON FOR ASSESSMENT:  Consult, Rounds Enteral/tube feeding initiation and management  ASSESSMENT:  Pt with PMH of severe cervical myelopathy and previous decompression, HTN, DM, and chronic cough admitted by NSGY on 6/22 for C5 corpectomy and ACCF.  6/22 - admitted; s/p C5 corpectomy/ACCF, take back to OR for epidural hematoma evac, decompression and C4-56 fixation 6/23 - failed cortrak placement 6/26 - tx to ICU, unable to place NG tube  6/27 - s/p MBS, per SLP ok to have sips of nectar and bites of puree with staff; 10 F small bore feeding tube placed in IR; coiled in stomach per xray  6/28 - SLP evaluation, DYS 1 with nectar diet order entered 6/29 - SLP evaluation, upgraded to DYS 3 diet, nectar thick liquids  Pt resting in bed at the time of assessment. States that he ate 60% of his puree breakfast this AM with SLP. Does report he is feeling full right now and is not sure how much lunch he will be able to eat.   Discussed the possibility of changing feeds to nocturnal and  allowing a break from the pump to encourage daytime hunger. Pt very interested in this idea. Also agreeable to receiving nutrition supplements to augment oral intake. Will add Nepro TID as they are calorically dense and already nectar thick. Prefers wild berry.  Pt does note that his hands are weak today and he had trouble with his containers and condiments at breakfast. Will request nursing assist with tray set up.  Nutritionally Relevant Medications: Scheduled Meds:  dexamethasone  4 mg Oral Q12H   docusate  100 mg Per Tube BID   GLUCERNA SHAKE  237 mL Oral TID BM   PROSource TF  45 mL Per Tube Daily   furosemide  40 mg Oral BID   glimepiride  4 mg Oral Q breakfast   insulin aspart  0-15 Units Subcutaneous Q4H   insulin aspart  3 Units Subcutaneous Q4H   insulin glargine-yfgn  10 Units Subcutaneous QHS   pantoprazole sodium  40 mg Oral QHS   potassium chloride  20 mEq Oral Daily   rosuvastatin  20 mg Oral Daily   senna  1 tablet Oral BID   Continuous Infusions:  sodium chloride     GLUCERNA 1.5 CAL 65 mL/hr at 03/18/22 0120   PRN Meds: alum & mag hydroxide-simeth, bisacodyl, phenol, polyethylene glycol, sodium phosphate  Labs Reviewed: K 3.5 Phosphorus 2.2 CBG ranges from 166-314 mg/dL over the last 24 hours HgbA1c 6.1% (6/15)  NUTRITION - FOCUSED PHYSICAL EXAM: Flowsheet Row Most Recent Value  Orbital Region Moderate depletion  Upper Arm Region Severe  depletion  Thoracic and Lumbar Region Severe depletion  Buccal Region Severe depletion  Temple Region Severe depletion  Clavicle Bone Region Severe depletion  Clavicle and Acromion Bone Region Severe depletion  Scapular Bone Region Severe depletion  Dorsal Hand Severe depletion  Patellar Region Severe depletion  Anterior Thigh Region Severe depletion  Posterior Calf Region Moderate depletion  Edema (RD Assessment) None  Hair Reviewed  Eyes Reviewed  Mouth Reviewed  [per pt he has dentures here but currently not in]   Skin Reviewed  Nails Reviewed   Diet Order:   Diet Order             DIET DYS 3 Room service appropriate? Yes with Assist; Fluid consistency: Nectar Thick  Diet effective now                   EDUCATION NEEDS:  Education needs have been addressed  Skin:  Skin Assessment: Skin Integrity Issues: Skin Integrity Issues:: Diabetic Ulcer, Incisions Diabetic Ulcer: R plantar foot DM ulcer Incisions: cervical  Last BM:  6/28 - type 2  Height:  Ht Readings from Last 1 Encounters:  03/11/22 6' (1.829 m)    Weight:  Wt Readings from Last 1 Encounters:  03/18/22 88.6 kg    Ideal Body Weight:  80.9 kg  BMI:  Body mass index is 26.49 kg/m.  Estimated Nutritional Needs:  Kcal:  2200-2400 Protein:  130-150 grams Fluid:  >2.2 L/day    Ranell Patrick, RD, LDN Clinical Dietitian RD pager # available in [place]  After hours/weekend pager # available in [facility]

## 2022-03-18 NOTE — Assessment & Plan Note (Addendum)
Basal/bolus regimen ordered Will hold glimeperide while inpatient Watch while on steroids, nsgy d/cing today

## 2022-03-19 ENCOUNTER — Inpatient Hospital Stay (HOSPITAL_COMMUNITY): Payer: HMO

## 2022-03-19 ENCOUNTER — Ambulatory Visit (HOSPITAL_COMMUNITY): Payer: HMO

## 2022-03-19 DIAGNOSIS — M4712 Other spondylosis with myelopathy, cervical region: Secondary | ICD-10-CM | POA: Diagnosis not present

## 2022-03-19 LAB — CBC WITH DIFFERENTIAL/PLATELET
Abs Immature Granulocytes: 0.39 10*3/uL — ABNORMAL HIGH (ref 0.00–0.07)
Basophils Absolute: 0 10*3/uL (ref 0.0–0.1)
Basophils Relative: 0 %
Eosinophils Absolute: 0 10*3/uL (ref 0.0–0.5)
Eosinophils Relative: 0 %
HCT: 31.2 % — ABNORMAL LOW (ref 39.0–52.0)
Hemoglobin: 10.9 g/dL — ABNORMAL LOW (ref 13.0–17.0)
Immature Granulocytes: 2 %
Lymphocytes Relative: 5 %
Lymphs Abs: 0.9 10*3/uL (ref 0.7–4.0)
MCH: 34.6 pg — ABNORMAL HIGH (ref 26.0–34.0)
MCHC: 34.9 g/dL (ref 30.0–36.0)
MCV: 99 fL (ref 80.0–100.0)
Monocytes Absolute: 1.4 10*3/uL — ABNORMAL HIGH (ref 0.1–1.0)
Monocytes Relative: 7 %
Neutro Abs: 16.5 10*3/uL — ABNORMAL HIGH (ref 1.7–7.7)
Neutrophils Relative %: 86 %
Platelets: 276 10*3/uL (ref 150–400)
RBC: 3.15 MIL/uL — ABNORMAL LOW (ref 4.22–5.81)
RDW: 16.3 % — ABNORMAL HIGH (ref 11.5–15.5)
WBC: 19.3 10*3/uL — ABNORMAL HIGH (ref 4.0–10.5)
nRBC: 0.2 % (ref 0.0–0.2)

## 2022-03-19 LAB — COMPREHENSIVE METABOLIC PANEL WITH GFR
ALT: 13 U/L (ref 0–44)
AST: 13 U/L — ABNORMAL LOW (ref 15–41)
Albumin: 2.9 g/dL — ABNORMAL LOW (ref 3.5–5.0)
Alkaline Phosphatase: 45 U/L (ref 38–126)
Anion gap: 9 (ref 5–15)
BUN: 24 mg/dL — ABNORMAL HIGH (ref 8–23)
CO2: 28 mmol/L (ref 22–32)
Calcium: 8.4 mg/dL — ABNORMAL LOW (ref 8.9–10.3)
Chloride: 98 mmol/L (ref 98–111)
Creatinine, Ser: 0.74 mg/dL (ref 0.61–1.24)
GFR, Estimated: >60 mL/min
Glucose, Bld: 120 mg/dL — ABNORMAL HIGH (ref 70–99)
Potassium: 3.7 mmol/L (ref 3.5–5.1)
Sodium: 135 mmol/L (ref 135–145)
Total Bilirubin: 0.7 mg/dL (ref 0.3–1.2)
Total Protein: 5.9 g/dL — ABNORMAL LOW (ref 6.5–8.1)

## 2022-03-19 LAB — GLUCOSE, CAPILLARY
Glucose-Capillary: 137 mg/dL — ABNORMAL HIGH (ref 70–99)
Glucose-Capillary: 158 mg/dL — ABNORMAL HIGH (ref 70–99)
Glucose-Capillary: 221 mg/dL — ABNORMAL HIGH (ref 70–99)
Glucose-Capillary: 421 mg/dL — ABNORMAL HIGH (ref 70–99)
Glucose-Capillary: 168 mg/dL — ABNORMAL HIGH (ref 70–99)

## 2022-03-19 LAB — MAGNESIUM: Magnesium: 1.8 mg/dL (ref 1.7–2.4)

## 2022-03-19 LAB — PHOSPHORUS: Phosphorus: 4 mg/dL (ref 2.5–4.6)

## 2022-03-19 MED ORDER — INSULIN ASPART 100 UNIT/ML IJ SOLN
0.0000 [IU] | INTRAMUSCULAR | Status: DC
  Administered 2022-03-19: 20 [IU] via SUBCUTANEOUS
  Administered 2022-03-20: 4 [IU] via SUBCUTANEOUS
  Administered 2022-03-20: 3 [IU] via SUBCUTANEOUS
  Administered 2022-03-20: 4 [IU] via SUBCUTANEOUS

## 2022-03-19 MED ORDER — DOCUSATE SODIUM 50 MG/5ML PO LIQD
100.0000 mg | Freq: Two times a day (BID) | ORAL | Status: DC
  Administered 2022-03-20 – 2022-03-23 (×5): 100 mg via ORAL
  Filled 2022-03-19 (×6): qty 10

## 2022-03-19 NOTE — Progress Notes (Signed)
Physical Therapy Treatment Patient Details Name: [name] MRN: [id number] DOB: [birth year] Today's Date: 03/19/2022   History of Present Illness 76 yo male presenting 6/22 for cervical corpectomy of C5 with undercutting of C4 and decompression of ossification of the posterior longitudinal ligament and the spinal canal from C3-C5. PMH includes: prior decompression of C3-4, HTN, DM II    PT Comments    Pt was much more fatigued upon PT arrival today, thereby requiring more assistance of minA to come to stand in the stedy. Pt also with noted crackling breathing and sats down to 89% on RA intermittently, RN notified. Once back to bed, focused remainder of session on L UE strengthening. Pt was denied by [facility], thus updated d/c recs to SNF. Will continue to follow acutely.     Recommendations for follow up therapy are one component of a multi-disciplinary discharge planning process, led by the attending physician.  Recommendations may be updated based on patient status, additional functional criteria and insurance authorization.  Follow Up Recommendations  Skilled nursing-short term rehab (<3 hours/day) (denied by [facility]) Can patient physically be transported by private vehicle: No (has been inconsistent based on level of fatigue lately)   Assistance Recommended at Discharge Frequent or constant Supervision/Assistance  Patient can return home with the following A lot of help with bathing/dressing/bathroom;Assistance with cooking/housework;Direct supervision/assist for medications management;Direct supervision/assist for financial management;Assist for transportation;Help with stairs or ramp for entrance;A lot of help with walking and/or transfers   Equipment Recommendations  Other (comment) (TBA)    Recommendations for Other Services       Precautions / Restrictions Precautions Precautions: Cervical Precaution Booklet Issued: Yes (comment) Precaution Comments: needs reminders to  maintain Required Braces or Orthoses: Other Brace Other Brace: no brace needed per order set Restrictions Weight Bearing Restrictions: No     Mobility  Bed Mobility Overal bed mobility: Needs Assistance Bed Mobility: Sit to Supine       Sit to supine: Mod assist   General bed mobility comments: ModA to manage trunk and primarily to lift legs onto bed.    Transfers Overall transfer level: Needs assistance Equipment used: Ambulation equipment used Transfers: Sit to/from Stand, Bed to chair/wheelchair/BSC Sit to Stand: Min assist           General transfer comment: Pt in chair and reported being very fatigued, thus obtained stedy to ensure pt and PT safety. Needs assistance to lift L UE onto bar and minA to power up to stand and extend hips 1x from recliner and 1x from stedy flaps. Pt then transferred to bed from recliner using stedy Transfer via Lift Equipment: Stedy  Ambulation/Gait               General Gait Details: deferred due to pt fatigue today   Stairs             Wheelchair Mobility    Modified Rankin (Stroke Patients Only)       Balance Overall balance assessment: Needs assistance Sitting-balance support: Feet supported, Bilateral upper extremity supported, Single extremity supported Sitting balance-Leahy Scale: Poor Sitting balance - Comments: MinA and UE support to sit statically EOB   Standing balance support: Bilateral upper extremity supported, During functional activity Standing balance-Leahy Scale: Poor Standing balance comment: Reliant on UE support                            Cognition Arousal/Alertness: Awake/alert Behavior During Therapy: [facility] for  tasks assessed/performed Overall Cognitive Status: Impaired/Different from baseline Area of Impairment: Problem solving, Memory                     Memory: Decreased recall of precautions, Decreased short-term memory       Problem Solving: Slow processing,  Decreased initiation, Difficulty sequencing, Requires verbal cues General Comments: Pt does not recall the same PT from checking in earlier in the day and having treated him the past 2 days. Pt needs cues to sequence all mobility, extra time for processing        Exercises General Exercises - Upper Extremity Shoulder Flexion: AAROM, Left, 10 reps, Supine, Strengthening Shoulder ABduction: AAROM, Strengthening, Left, 10 reps, Supine Elbow Flexion: AAROM, Left, 10 reps, Supine, Strengthening Elbow Extension: AAROM, Strengthening, Left, 10 reps, Supine Other Exercises Other Exercises: obtain incentive spirometer and educated pt to use it, x3 reps before PT left room    General Comments General comments (skin integrity, edema, etc.): SpO2 as low as 89% on RA at one point, HR 80s; notified RN of sats and pt's crackled breathing today      Pertinent Vitals/Pain Pain Assessment Pain Assessment: Faces Faces Pain Scale: Hurts little more Pain Location: generalized with mobility Pain Descriptors / Indicators: Grimacing, Sore Pain Intervention(s): Limited activity within patient's tolerance, Monitored during session, Repositioned    Home Living                          Prior Function            PT Goals (current goals can now be found in the care plan section) Acute Rehab PT Goals Patient Stated Goal: to go back to bed PT Goal Formulation: With patient Time For Goal Achievement: 03/26/22 Potential to Achieve Goals: Good Progress towards PT goals: Not progressing toward goals - comment (limited by fatigue today)    Frequency    Min 5X/week      PT Plan Discharge plan needs to be updated    Co-evaluation              AM-PAC PT "6 Clicks" Mobility   Outcome Measure  Help needed turning from your back to your side while in a flat bed without using bedrails?: A Little Help needed moving from lying on your back to sitting on the side of a flat bed without using  bedrails?: A Little Help needed moving to and from a bed to a chair (including a wheelchair)?: Total Help needed standing up from a chair using your arms (e.g., wheelchair or bedside chair)?: A Little Help needed to walk in hospital room?: Total Help needed climbing 3-5 steps with a railing? : Total 6 Click Score: 12    End of Session Equipment Utilized During Treatment: Gait belt Activity Tolerance: Patient tolerated treatment well;Patient limited by fatigue Patient left: with call bell/phone within reach;in bed;with bed alarm set Nurse Communication: Mobility status;Need for lift equipment;Other (comment) (sats, crackled breathing) PT Visit Diagnosis: Unsteadiness on feet (R26.81);Other abnormalities of gait and mobility (R26.89);History of falling (Z91.81);Muscle weakness (generalized) (M62.81);Difficulty in walking, not elsewhere classified (R26.2);Other symptoms and signs involving the nervous system (R29.898)     Time: 2376-2831 PT Time Calculation (min) (ACUTE ONLY): 33 min  Charges:  $Therapeutic Exercise: 8-22 mins $Therapeutic Activity: 8-22 mins                     Moishe Spice, PT, DPT Acute Rehabilitation  Services  Office: [place] 03/19/2022, 5:27 PM

## 2022-03-19 NOTE — Progress Notes (Signed)
Speech Language Pathology Treatment: Dysphagia  Patient Details Name: [name] MRN: [id number] DOB: December 04, 1945 Today's Date: 03/19/2022 Time: 1215-1300 SLP Time Calculation (min) (ACUTE ONLY): 45 min  Assessment / Plan / Recommendation Clinical Impression  [name] was sitting in recliner, attempting to feed himself lunch. He was having difficulty with some of the items on his tray due to how dry they are, but overall he reported continued improvement with swallowing.  NG still present. He tolerated soft solids and nectar thick liquids with occasional throat clearing but no overt coughing.  He was assisted with ordering soft/moist foods for dinner and breakfast tomorrow.    Continue dysphagia 3/nectars. Meds via NG or crushed.  He needs assistance with self-feeding. Making good progress. SLP will follow.   HPI HPI: [name] is a 76 year old male who has had a previous a severe stenosis at C3-C4 which Dr Ellene Route decompressed years ago.  The patient had developed some increasing weakness in his left arm was particularly and the recent MRI demonstrated the presence of left-sided cord compression secondary to ossification of the posterior longitudinal ligament creating significant cord compression on that left side.   He had undergone decompression earlier on 6/22 with fixation from C4-C6, but postoperatively was noted to be weak. CT scan was performed and this demonstrated presence of some blood in the epidural space and some increased soft tissue swelling also suggested of blood and there was some evidence of residual bone still in the canal superiorly at the level of C4.  He was taken back to the operating room to undergo revision and evacuation. Hardware removed with more removal of bone with a longer cage placed. Pt observed to cough with pills given in am on 6/23.      SLP Plan  Continue with current plan of care      Recommendations for follow up therapy are one component of a  multi-disciplinary discharge planning process, led by the attending physician.  Recommendations may be updated based on patient status, additional functional criteria and insurance authorization.    Recommendations  Diet recommendations: Dysphagia 3 (mechanical soft);Nectar-thick liquid Liquids provided via: Straw;Cup Medication Administration: Crushed with puree Supervision: Staff to assist with self feeding Compensations: Slow rate;Small sips/bites;Follow solids with liquid Postural Changes and/or Swallow Maneuvers: Seated upright 90 degrees                Oral Care Recommendations: Oral care BID Follow Up Recommendations: Acute inpatient rehab (3hours/day) Assistance recommended at discharge: Frequent or constant Supervision/Assistance SLP Visit Diagnosis: Dysphagia, oropharyngeal phase (R13.12) Plan: Continue with current plan of care         Christopher Minter L. Tivis Ringer, MA CCC/SLP Clinical Specialist - Acute Care SLP Acute Rehabilitation Services Office number [phone]   [name]  03/19/2022, 1:37 PM

## 2022-03-19 NOTE — Progress Notes (Signed)
PROGRESS NOTE    [name]  ZOX:[id number] DOB: Mar 26, 1946 DOA: 03/11/2022 PCP: Renee Rival, FNP  No chief complaint on file.   Brief Narrative:  76 year old male who with previous severe C3-C4 stenosis with prior decompression years prior with worsening left arm weakness with MRI demonstrating left sided cord compression secondary to ossification of the posterior longitudinal ligament creating significant cord compression on left side.  He was admitted to [facility] on 6/22 for C5 corpectomy and ACCF found to have increased weakness postoperatively, taken back to OR for evacuation of epidural hematoma and revision of anterior decompression and fixation from C4-C6.  Hospitalization complicated by ongoing dysphagia despite steroids.  Speech has been following and remains NPO except ice chips.  On 6/26, he has had increasing hypoxia with concerns for aspiration.  PCCM consulted for further recommendations.  Significant events 6/22 admitted NSGY, OR C5 corpectomy/ ACCF> takeback to OR for epidural hematoma evac, decompression and C4-C6 fixation. 6/28 SLP reevaluated swallow with NGT in place and observed improvement in swallow seen but continued to recommend NGT as primary means of nutrition support  6/29 [facility] consulting for medical issues     Assessment & Plan:   Principal Problem:   Cervical spondylosis with myelopathy Active Problems:   Acute respiratory failure with hypoxia (HCC)   Dysphagia   Hypertension   Hyperlipidemia   Type 2 diabetes mellitus (HCC)   Hypothyroidism   Protein-calorie malnutrition, severe   Assessment and Plan: * Cervical spondylosis with myelopathy S/p C5 cervical corpectomy with undercutting of C4 and decompression of ossification of the posterior longitudinal ligament an the spinal canal from C3-5.  Reconstruction with titanium spacer and autograft arthrodesis C4 to C6 anterior plate fixation from W1-X9 on 1/47 Complicated by hematoma with increased  weakness after surgical corpectomy C5 Increased weakness to L arm today, repeat MRI C spine per nsgy (MRI motion degraded, preverebral collection which extends into the L neck soft tissues (at least moderate narrowing of aerodigestive tract).  Prior ACDF at C3-6 levels and prior C5 corpectomy.  ACDF hardware spans C4-C6 levels, Kyrese Gartman corpectomy device is present at C5.  C4-C5 there is 8 mm T2 hypointense focus within L aspect of spinal canal. Up to moderate spinal canal stenosis at remaining levels.  C spine Ct this AM with revision of C4-5 corpectomy and fusion, smaller residual bone fragment in the L C4 canal.  Interval stable C5-6 corpectomy and fusion appearance.  Underlying C2-3 and C3-4 ankylosis.  Chronic C6-7 and C7-T1 degeneration.  Postoperative prevertebral fluid collection, likely seroma.    Per nsg   Acute respiratory failure with hypoxia (HCC) Occurred post op  Had hematoma with increased weakness after surgical corpectomy C5 6/22 underwent evacuation of surgical hematoma and revision of anterior decompression fixation from C5 corpectomy with fixation from C4-6 PCCM consulted 6/26 with concern for worsening hypoxia and concern for aspiration Currently improved on RA, decadron per neurosurgery    Dysphagia Nocturnal feeds Follow PO intake Has feeding tube for now - per nsgy  Hyperlipidemia crestor  Hypertension Lasix, coreg Follow BP  Type 2 diabetes mellitus ([place]) Basal/bolus regimen ordered Will hold glimeperide while inpatient Watch while on steroids  Hypothyroidism synthroid  Protein-calorie malnutrition, severe Noted, RD     DVT prophylaxis: lovenox Code Status: full Family Communication: none Disposition:   Per nsgy   Consultants:  TR[facility] PCCM  Procedures:  Cervical corpectomy of C5 with undercutting of C4 and decompression of ossification of the posterior longitudinal  ligament and the spinal canal from C3-C5.  Reconstruction with titanium spacer and  autograft arthrodesis see 4 to see 6 anterior plate fixation from Q7-Y1     Evacuation of surgical hematoma and revision of anterior decompression fixation from C5 corpectomy with fixation from C4-C6  Antimicrobials:  Anti-infectives (From admission, onward)    Start     Dose/Rate Route Frequency Ordered Stop   03/12/22 0400  ceFAZolin (ANCEF) IVPB 2g/100 mL premix        2 g 200 mL/hr over 30 Minutes Intravenous Every 8 hours 03/11/22 1818 03/12/22 1211   03/12/22 0000  ceFAZolin (ANCEF) IVPB 2g/100 mL premix  Status:  Discontinued        2 g 200 mL/hr over 30 Minutes Intravenous Every 8 hours 03/11/22 2308 03/11/22 2315   03/11/22 0915  ceFAZolin (ANCEF) IVPB 2g/100 mL premix        2 g 200 mL/hr over 30 Minutes Intravenous On call to O.R. 03/11/22 0910 03/11/22 1642   03/11/22 0914  ceFAZolin (ANCEF) 2-4 GM/100ML-% IVPB       Note to Pharmacy: [name] O: cabinet override      03/11/22 0914 03/11/22 1313       Subjective: No new complaints  Objective: Vitals:   03/19/22 0324 03/19/22 0534 03/19/22 0744 03/19/22 1138  BP: (!) 151/84  (!) 157/70 128/71  Pulse: 79  77 84  Resp: '20  18 20  '$ Temp: 98.3 F (36.8 C)  98 F (36.7 C) 98.2 F (36.8 C)  TempSrc: Axillary     SpO2: 96%  94% 95%  Weight:  87.8 kg    Height:        Intake/Output Summary (Last 24 hours) at 03/19/2022 1720 Last data filed at 03/19/2022 1400 Gross per 24 hour  Intake 240 ml  Output 1950 ml  Net -1710 ml   Filed Weights   03/17/22 0500 03/18/22 0420 03/19/22 0534  Weight: 88.3 kg 88.6 kg 87.8 kg    Examination:  General exam: Appears calm and comfortable.  Sitting up eating at edge Respiratory system: unlabored Cardiovascular system: RRR Gastrointestinal system: cortrak  Central nervous system: L>R UE weakness Extremities: no LEE    Data Reviewed: I have personally reviewed following labs and imaging studies  CBC: Recent Labs  Lab 03/19/22 0326  WBC 19.3*  NEUTROABS 16.5*   HGB 10.9*  HCT 31.2*  MCV 99.0  PLT 950    Basic Metabolic Panel: Recent Labs  Lab 03/14/22 0433 03/15/22 0330 03/16/22 1453 03/17/22 0603 03/17/22 1737 03/19/22 0326  NA 133* 138  --   --   --  135  K 3.9 3.4*  --   --   --  3.7  CL 100 100  --   --   --  98  CO2 24 25  --   --   --  28  GLUCOSE 189* 167*  --   --   --  120*  BUN 13 19  --   --   --  24*  CREATININE 0.72 0.71  --   --   --  0.74  CALCIUM 8.6* 8.8*  --   --   --  8.4*  MG  --   --  2.0 2.2 1.9 1.8  PHOS 2.4* 2.3* 2.3* 3.2 2.2* 4.0    GFR: Estimated Creatinine Clearance: 83.5 mL/min (by C-G formula based on SCr of 0.74 mg/dL).  Liver Function Tests: Recent Labs  Lab 03/14/22 0433 03/15/22  0330 03/19/22 0326  AST  --   --  13*  ALT  --   --  13  ALKPHOS  --   --  45  BILITOT  --   --  0.7  PROT  --   --  5.9*  ALBUMIN 3.4* 3.2* 2.9*    CBG: Recent Labs  Lab 03/18/22 1947 03/18/22 2356 03/19/22 0323 03/19/22 0834 03/19/22 1154  GLUCAP 174* 107* 137* 158* 221*     Recent Results (from the past 240 hour(s))  MRSA Next Gen by PCR, Nasal     Status: None   Collection Time: 03/11/22 11:03 PM   Specimen: Nasal Mucosa; Nasal Swab  Result Value Ref Range Status   MRSA by PCR Next Gen NOT DETECTED NOT DETECTED Final    Comment: (NOTE) The GeneXpert MRSA Assay (FDA approved for NASAL specimens only), is one component of Natosha Bou comprehensive MRSA colonization surveillance program. It is not intended to diagnose MRSA infection nor to guide or monitor treatment for MRSA infections. Test performance is not FDA approved in patients less than 74 years old. Performed at [facility], [place] [address] [place], [place] [address]          Radiology Studies: CT CERVICAL SPINE WO CONTRAST  Result Date: 03/19/2022 CLINICAL DATA:  76 year old male with neck pain status post cervical spine corpectomy and fusion. Postoperative hematoma with increased weakness status post cervical evacuation of  hematoma and revision of ACDF on 03/11/2022. EXAM: CT CERVICAL SPINE WITHOUT CONTRAST TECHNIQUE: Multidetector CT imaging of the cervical spine was performed without intravenous contrast. Multiplanar CT image reconstructions were also generated. RADIATION DOSE REDUCTION: This exam was performed according to the departmental dose-optimization program which includes automated exposure control, adjustment of the mA and/or kV according to patient size and/or use of iterative reconstruction technique. COMPARISON:  Initial postoperative CT cervical spine 03/11/2022. Cervical MRI yesterday 03/18/2022. FINDINGS: Alignment: Stable from the initial postoperative CT 03/11/2022. Mild straightening of cervical lordosis. Cervicothoracic junction alignment is within normal limits. Posterior element alignment appears maintained. Skull base and vertebrae: Visualized skull base is intact. No atlanto-occipital dissociation. C1 and C2 are intact and aligned. See postoperative and chronic degenerative osseous details below. Soft tissues and spinal canal: Prevertebral postoperative fluid or edema up to 11 mm thick from the C3 through the C6 level. And this is somewhat contiguous with fluid tracking in the left paralaryngeal space toward the skin incision (series 5, image 63) which also has simple fluid density. The more superficial collection there is about 3 cm. No soft tissue gas. Lilybelle Mayeda nasoenteric tube is in place and courses appropriately into the upper esophagus. Mild mass effect on the posterior pharynx and larynx. Superimposed small surgical clips in the left carotid space. Disc levels: C2-C3 posterior element ankylosis and interbody ankylosis mostly on the left. C3-C4 posterior element and interbody ankylosis. Sequelae of C3 vertebral body hardware removal. C4-C5 combined ACDF and corpectomy. Revision of the active C4 cortical screws, with unchanged small bilateral C4 cortical screw fragments. Corpectomy spacer also revised and  taller. There remains Jawan Chavarria small bone fragment in the anterior left canal at C4 (series 4, image 56) although smaller. Residual C5 uncovertebral spurring also greater on the left. Moderate to severe chronic bilateral facet hypertrophy. C5-C6 corpectomy and hardware changes appear stable. C6 cortical screws intact. Mild foraminal endplate spurring. C6-C7 disc space loss with endplate and facet spurring appears stable. C7-T1 disc space loss with endplate and facet spurring appears stable. Upper chest:  Visible upper thoracic vertebrae appear intact with endplate and facet degeneration. Upper lungs are clear. Subglottic trachea is clear. Other: Negative visible noncontrast posterior fossa. IMPRESSION: 1. Revision of C4-C5 corpectomy and fusion since the CT on 03/11/2022. Smaller residual bone fragment in the left C4 canal (series 4, image 56). 2. Interval stable C5-C6 corpectomy and fusion appearance. 3. Underlying C2-C3 and C3-C4 ankylosis. Chronic C6-C7 and C7-T1 degeneration. 4. Postoperative prevertebral fluid collection, likely seroma, with similar fluid tracking along the left paralaryngeal space toward presumed skin incision site. 5. Partially visible nasoenteric tube with no adverse features. Electronically Signed   By: Genevie Ann M.D.   On: 03/19/2022 05:43   MR CERVICAL SPINE WO CONTRAST  Result Date: 03/18/2022 CLINICAL DATA:  Myelopathy, chronic, cervical spine. Additional history provided by scanning technologist: Patient reports left-sided weakness. EXAM: MRI CERVICAL SPINE WITHOUT CONTRAST TECHNIQUE: Multiplanar, multisequence MR imaging of the cervical spine was performed. No intravenous contrast was administered. COMPARISON:  Intraoperative radiographs of the cervical spine 03/11/2022. Cervical spine CT 03/11/2022. Cervical spine MRI 02/17/2022. FINDINGS: Intermittently motion degraded examination, limiting evaluation. Most notably, there is mild-to-moderate motion degradation of the sagittal T2 TSE  sequence, moderate to severe motion degradation of the least motion degraded axial T2 TSE sequence and severe motion degradation of the axial T2 GRE sequence. Alignment: Straightening of the expected cervical lordosis. Trace grade 1 anterolisthesis at C7-T1 and T1-T2. Vertebrae: Susceptibility artifact arising from ACDF hardware at the C4-C6 levels, and arising from Nickia Boesen C5 corpectomy device. No appreciable significant marrow edema or focal suspicious osseous lesion. Facet joint ankylosis on the left at C2-C3 and bilaterally at C3-C4, better appreciated on the prior CT. Cord: Redemonstrated focus of chronic myelomalacia within the spinal cord at the C3-C4 level. Within described limitations, no signal abnormality is appreciated within the spinal cord elsewhere. Posterior Fossa, vertebral arteries, paraspinal tissues: No abnormality identified within included portions of the posterior fossa. Flow voids preserved within the imaged cervical vertebral arteries. T2 hyperintense and T1 hypointense collection with prevertebral components, as well as components extending into the left neck soft tissues. The prevertebral component spans the C2-C6 levels and measures up to 1.5 cm in AP dimension. The component more laterally within the left neck soft tissues measures 3.2 x 5.8 cm (series 5, image 15) (series 11, images 9-23). The prevertebral component results in at least moderate narrowing of the aerodigestive tract. Disc levels: Disc degeneration at the non operative levels, greatest at C6-C7 (advanced) and C7-T1 (moderate to moderately advanced). Generally narrow cervical spinal canal. C2-C3: Shallow disc bulge. Uncovertebral hypertrophy on the left. Facet arthrosis/hypertrophy. No significant spinal canal stenosis. Bilateral neural foraminal narrowing (mild right, moderate left). C3-C4: Vertebral ankylosis. Uncovertebral hypertrophy. Facet and ligamentum flavum hypertrophy. Mild-to-moderate spinal canal stenosis. Bilateral  neural foraminal narrowing (moderate right, severe left). C4-C5: Prior ACDF and C5 corpectomy. Uncovertebral hypertrophy.8 mm T2 hypointense focus within the left anterior aspect of the spinal canal, likely corresponding with the bone fragment identified at this site on the prior CT cervical spine of 03/11/2022. Facet arthrosis. The bone fragment contributes to persistent moderate/severe canal stenosis on the left with some flattening of the ventral spinal cord (series 9, image 19). Apparent severe bilateral neural foraminal narrowing, although motion degradation significantly limits evaluation. C5-C6: Prior ACDF and C5 corpectomy. Uncovertebral hypertrophy. Facet arthrosis and ligamentum flavum hypertrophy. Moderate spinal canal stenosis. Apparent moderate bilateral neural foraminal narrowing, although motion degradation limits evaluation. C6-C7: Broad-based central disc protrusion. Bilateral disc osteophyte ridge/uncinate hypertrophy. Facet arthrosis  and ligamentum flavum thickening. Ossification of the posterior longitudinal ligament. The disc protrusion contributes to apparent moderate spinal canal stenosis, contacting and mildly flattening of the ventral spinal cord. Bilateral neural foraminal narrowing (mild right, moderate left). C7-T1: Trace grade 1 anterolisthesis. Slight disc bulge. Facet arthrosis and ligamentum flavum thickening. No significant spinal canal stenosis or neural foraminal narrowing. Impressions 1, 2 and 4 will be called to the ordering clinician or representative by the Radiologist Assistant, and communication documented in the PACS or [employer]. IMPRESSION: 1. Significantly motion degraded examination, as described and limiting evaluation. 2. Prevertebral collection, which also extends into the left neck soft tissues. The prevertebral component of the collection spans the C2-C6 levels and measures up to 1.5 cm in AP dimension. The component of the collection within the left neck  soft tissues measures 3.2 cm in AP dimension and 5.8 cm in craniocaudal dimension. The prevertebral component results in at least moderate narrowing of the aerodigestive tract. 3. Prior ACDF at the C3-C6 levels and prior C5 corpectomy. ACDF hardware spans the C4-C6 levels, and Johnmichael Melhorn corpectomy device is present at C5. 4. At C4-C5, there is an 8 mm T2 hypointense focus within the left aspect of the spinal canal which likely corresponds with the bone fragment identified at this site on the prior CT of 03/11/2022. This bone fragment contributes to persistent moderate/severe spinal canal stenosis to the left, with some flattening of the left aspect of the spinal cord. 5. Up to moderate spinal canal stenosis at the remaining levels. Multilevel neural foraminal stenosis, as detailed. Electronically Signed   By: Kellie Simmering D.O.   On: 03/18/2022 16:38        Scheduled Meds:  azelastine  1 spray Each Nare Q1500   carvedilol  3.125 mg Oral BID WC   Chlorhexidine Gluconate Cloth  6 each Topical Daily   dexamethasone  4 mg Oral Q12H   docusate  100 mg Per Tube BID   enoxaparin (LOVENOX) injection  40 mg Subcutaneous Q24H   feeding supplement (GLUCERNA 1.5 CAL)  1,000 mL Per Tube Q24H   feeding supplement (NEPRO CARB STEADY)  237 mL Oral TID BM   feeding supplement (PROSource TF)  45 mL Per Tube Daily   fluticasone  1 spray Each Nare Daily   furosemide  40 mg Oral BID   gabapentin  100 mg Oral QHS   insulin aspart  0-15 Units Subcutaneous Q4H   insulin aspart  3 Units Subcutaneous Q4H   insulin glargine-yfgn  10 Units Subcutaneous QHS   levothyroxine  88 mcg Oral Q0600   mouth rinse  15 mL Mouth Rinse 4 times per day   pantoprazole sodium  40 mg Oral QHS   potassium chloride  20 mEq Oral Daily   rOPINIRole  0.5 mg Oral TID   rosuvastatin  20 mg Oral Daily   senna  1 tablet Oral BID   sodium chloride  2 spray Each Nare BID   sodium chloride flush  3 mL Intravenous Q12H   Continuous Infusions:   sodium chloride     methocarbamol (ROBAXIN) IV       LOS: 8 days    Time spent: over 30 min    Fayrene Helper, MD [facility]   To contact the attending provider between 7A-7P or the covering provider during after hours 7P-7A, please log into the web site [email or web address] and access using universal [place] password for that web site. If you do not have the password,  please call the hospital operator.  03/19/2022, 5:20 PM

## 2022-03-19 NOTE — TOC Progression Note (Signed)
Transition of Care [facility]) - Progression Note    Patient Details  Name: [name] MRN: [id number] Date of Birth: 29-Jul-1946  Transition of [facility]) CM/SW [place], [place] Phone Number: 03/19/2022, 10:05 AM  Clinical Narrative:   CSW spoke with RN this morning that patient is improving with oral intake, may be able to take cortrak out today. CSW checked with [facility] on bed availability for Monday, and they will have bed available. CSW contacted [employer] to initiate insurance authorization for SNF and PTAR. CSW to follow.    Expected Discharge Plan: Skilled Nursing Facility Barriers to Discharge: Continued Medical Work up, Orthoptist and Services Expected Discharge Plan: [place]     Post Acute Care Choice: [place]                                         Social Determinants of Health (SDOH) Interventions    Readmission Risk Interventions     No data to display

## 2022-03-19 NOTE — Progress Notes (Signed)
Patient ID: [name], male   DOB: Dec 23, 1945, 76 y.o.   MRN: [id number] I have reviewed the CT scan of [name].  Though there is a residual spur on the left side of his neck at C4 repeat anterior surgery would be treacherous for him.  I believe that he will need to heal for a period of time and then we can consider whether he would be a candidate for posterior decompression and further stabilization.  For now I believe that he can be transferred to rehab as he actually is continuing to show some signs of improvement.

## 2022-03-20 ENCOUNTER — Other Ambulatory Visit: Payer: Self-pay

## 2022-03-20 DIAGNOSIS — M4712 Other spondylosis with myelopathy, cervical region: Secondary | ICD-10-CM | POA: Diagnosis not present

## 2022-03-20 DIAGNOSIS — D72829 Elevated white blood cell count, unspecified: Secondary | ICD-10-CM

## 2022-03-20 DIAGNOSIS — G4700 Insomnia, unspecified: Secondary | ICD-10-CM

## 2022-03-20 LAB — COMPREHENSIVE METABOLIC PANEL
ALT: 16 U/L (ref 0–44)
AST: 13 U/L — ABNORMAL LOW (ref 15–41)
Albumin: 2.8 g/dL — ABNORMAL LOW (ref 3.5–5.0)
Alkaline Phosphatase: 44 U/L (ref 38–126)
Anion gap: 10 (ref 5–15)
BUN: 27 mg/dL — ABNORMAL HIGH (ref 8–23)
CO2: 30 mmol/L (ref 22–32)
Calcium: 8.6 mg/dL — ABNORMAL LOW (ref 8.9–10.3)
Chloride: 95 mmol/L — ABNORMAL LOW (ref 98–111)
Creatinine, Ser: 0.84 mg/dL (ref 0.61–1.24)
GFR, Estimated: >60 mL/min (ref >60)
Glucose, Bld: 135 mg/dL — ABNORMAL HIGH (ref 70–99)
Potassium: 3.9 mmol/L (ref 3.5–5.1)
Sodium: 135 mmol/L (ref 135–145)
Total Bilirubin: 1.2 mg/dL (ref 0.3–1.2)
Total Protein: 6.2 g/dL — ABNORMAL LOW (ref 6.5–8.1)

## 2022-03-20 LAB — CBC WITH DIFFERENTIAL/PLATELET
Abs Immature Granulocytes: 0.52 10*3/uL — ABNORMAL HIGH (ref 0.00–0.07)
Basophils Absolute: 0 10*3/uL (ref 0.0–0.1)
Basophils Relative: 0 %
Eosinophils Absolute: 0 10*3/uL (ref 0.0–0.5)
Eosinophils Relative: 0 %
HCT: 32.1 % — ABNORMAL LOW (ref 39.0–52.0)
Hemoglobin: 10.8 g/dL — ABNORMAL LOW (ref 13.0–17.0)
Immature Granulocytes: 2 %
Lymphocytes Relative: 2 %
Lymphs Abs: 0.6 10*3/uL — ABNORMAL LOW (ref 0.7–4.0)
MCH: 32.5 pg (ref 26.0–34.0)
MCHC: 33.6 g/dL (ref 30.0–36.0)
MCV: 96.7 fL (ref 80.0–100.0)
Monocytes Absolute: 0.8 10*3/uL (ref 0.1–1.0)
Monocytes Relative: 3 %
Neutro Abs: 22.4 10*3/uL — ABNORMAL HIGH (ref 1.7–7.7)
Neutrophils Relative %: 93 %
Platelets: 301 10*3/uL (ref 150–400)
RBC: 3.32 MIL/uL — ABNORMAL LOW (ref 4.22–5.81)
RDW: 16.2 % — ABNORMAL HIGH (ref 11.5–15.5)
WBC: 24.3 10*3/uL — ABNORMAL HIGH (ref 4.0–10.5)
nRBC: 0.2 % (ref 0.0–0.2)

## 2022-03-20 LAB — GLUCOSE, CAPILLARY
Glucose-Capillary: 105 mg/dL — ABNORMAL HIGH (ref 70–99)
Glucose-Capillary: 120 mg/dL — ABNORMAL HIGH (ref 70–99)
Glucose-Capillary: 143 mg/dL — ABNORMAL HIGH (ref 70–99)
Glucose-Capillary: 158 mg/dL — ABNORMAL HIGH (ref 70–99)
Glucose-Capillary: 165 mg/dL — ABNORMAL HIGH (ref 70–99)
Glucose-Capillary: 217 mg/dL — ABNORMAL HIGH (ref 70–99)

## 2022-03-20 LAB — PHOSPHORUS: Phosphorus: 4.2 mg/dL (ref 2.5–4.6)

## 2022-03-20 LAB — MAGNESIUM: Magnesium: 1.9 mg/dL (ref 1.7–2.4)

## 2022-03-20 MED ORDER — INSULIN ASPART 100 UNIT/ML IJ SOLN
0.0000 [IU] | Freq: Every day | INTRAMUSCULAR | Status: DC
  Administered 2022-03-21: 2 [IU] via SUBCUTANEOUS

## 2022-03-20 MED ORDER — DEXAMETHASONE 2 MG PO TABS
2.0000 mg | ORAL_TABLET | Freq: Two times a day (BID) | ORAL | Status: AC
  Administered 2022-03-20 – 2022-03-21 (×2): 2 mg via ORAL
  Filled 2022-03-20 (×2): qty 1

## 2022-03-20 MED ORDER — INSULIN ASPART 100 UNIT/ML IJ SOLN
0.0000 [IU] | Freq: Three times a day (TID) | INTRAMUSCULAR | Status: DC
  Administered 2022-03-20: 7 [IU] via SUBCUTANEOUS
  Administered 2022-03-21: 4 [IU] via SUBCUTANEOUS
  Administered 2022-03-21 (×2): 3 [IU] via SUBCUTANEOUS
  Administered 2022-03-22: 11 [IU] via SUBCUTANEOUS
  Administered 2022-03-22: 7 [IU] via SUBCUTANEOUS
  Administered 2022-03-23: 4 [IU] via SUBCUTANEOUS

## 2022-03-20 MED ORDER — TRAZODONE HCL 50 MG PO TABS
50.0000 mg | ORAL_TABLET | Freq: Every day | ORAL | Status: DC
  Administered 2022-03-20 – 2022-03-22 (×3): 50 mg via ORAL
  Filled 2022-03-20 (×3): qty 1

## 2022-03-20 NOTE — Progress Notes (Signed)
CCMD called patient had 6 beats of non sustained Vtach HR was 90's. This RN went to patient room, he is lying on his bed appears comfortable and sleepy, denies chest pain/discomfort. VS were taken. On call neurosurgeon paged and called back. EKG was ordered and obtained.

## 2022-03-20 NOTE — Progress Notes (Signed)
Subjective: Patient reports a general feeling of being unwell.   Objective: Vital signs in last 24 hours: Temp:  [97.5 F (36.4 C)-98.3 F (36.8 C)] 98 F (36.7 C) (07/01 1100) Pulse Rate:  [44-93] 85 (07/01 1100) Resp:  [16-20] 16 (07/01 1100) BP: (104-157)/(64-87) 145/85 (07/01 1100) SpO2:  [92 %-100 %] 98 % (07/01 1100) Weight:  [85.9 kg] 85.9 kg (07/01 0500)  Intake/Output from previous day: 06/30 0701 - 07/01 0700 In: 260 [P.O.:260] Out: 1501 [Urine:1500; Stool:1] Intake/Output this shift: No intake/output data recorded.  Physical Exam: Patient is awake, A/O X 4, and conversant. Eyes open spontaneously. They are in NAD and VSS. Doing well. Speech is fluent and appropriate. MAEW except LUE 4-/5. Sensation to light touch is intact. PERLA, EOMI. CNs grossly intact. Dressing is clean dry intact.   Lab Results: Recent Labs    03/19/22 0326 03/20/22 0353  WBC 19.3* 24.3*  HGB 10.9* 10.8*  HCT 31.2* 32.1*  PLT 276 301   BMET Recent Labs    03/19/22 0326 03/20/22 0353  NA 135 135  K 3.7 3.9  CL 98 95*  CO2 28 30  GLUCOSE 120* 135*  BUN 24* 27*  CREATININE 0.74 0.84  CALCIUM 8.4* 8.6*    Studies/Results: CT CERVICAL SPINE WO CONTRAST  Result Date: 03/19/2022 CLINICAL DATA:  76 year old male with neck pain status post cervical spine corpectomy and fusion. Postoperative hematoma with increased weakness status post cervical evacuation of hematoma and revision of ACDF on 03/11/2022. EXAM: CT CERVICAL SPINE WITHOUT CONTRAST TECHNIQUE: Multidetector CT imaging of the cervical spine was performed without intravenous contrast. Multiplanar CT image reconstructions were also generated. RADIATION DOSE REDUCTION: This exam was performed according to the departmental dose-optimization program which includes automated exposure control, adjustment of the mA and/or kV according to patient size and/or use of iterative reconstruction technique. COMPARISON:  Initial postoperative CT  cervical spine 03/11/2022. Cervical MRI yesterday 03/18/2022. FINDINGS: Alignment: Stable from the initial postoperative CT 03/11/2022. Mild straightening of cervical lordosis. Cervicothoracic junction alignment is within normal limits. Posterior element alignment appears maintained. Skull base and vertebrae: Visualized skull base is intact. No atlanto-occipital dissociation. C1 and C2 are intact and aligned. See postoperative and chronic degenerative osseous details below. Soft tissues and spinal canal: Prevertebral postoperative fluid or edema up to 11 mm thick from the C3 through the C6 level. And this is somewhat contiguous with fluid tracking in the left paralaryngeal space toward the skin incision (series 5, image 63) which also has simple fluid density. The more superficial collection there is about 3 cm. No soft tissue gas. A nasoenteric tube is in place and courses appropriately into the upper esophagus. Mild mass effect on the posterior pharynx and larynx. Superimposed small surgical clips in the left carotid space. Disc levels: C2-C3 posterior element ankylosis and interbody ankylosis mostly on the left. C3-C4 posterior element and interbody ankylosis. Sequelae of C3 vertebral body hardware removal. C4-C5 combined ACDF and corpectomy. Revision of the active C4 cortical screws, with unchanged small bilateral C4 cortical screw fragments. Corpectomy spacer also revised and taller. There remains a small bone fragment in the anterior left canal at C4 (series 4, image 56) although smaller. Residual C5 uncovertebral spurring also greater on the left. Moderate to severe chronic bilateral facet hypertrophy. C5-C6 corpectomy and hardware changes appear stable. C6 cortical screws intact. Mild foraminal endplate spurring. C6-C7 disc space loss with endplate and facet spurring appears stable. C7-T1 disc space loss with endplate and facet spurring appears stable.  Upper chest: Visible upper thoracic vertebrae appear  intact with endplate and facet degeneration. Upper lungs are clear. Subglottic trachea is clear. Other: Negative visible noncontrast posterior fossa. IMPRESSION: 1. Revision of C4-C5 corpectomy and fusion since the CT on 03/11/2022. Smaller residual bone fragment in the left C4 canal (series 4, image 56). 2. Interval stable C5-C6 corpectomy and fusion appearance. 3. Underlying C2-C3 and C3-C4 ankylosis. Chronic C6-C7 and C7-T1 degeneration. 4. Postoperative prevertebral fluid collection, likely seroma, with similar fluid tracking along the left paralaryngeal space toward presumed skin incision site. 5. Partially visible nasoenteric tube with no adverse features. Electronically Signed   By: Genevie Ann M.D.   On: 03/19/2022 05:43   MR CERVICAL SPINE WO CONTRAST  Result Date: 03/18/2022 CLINICAL DATA:  Myelopathy, chronic, cervical spine. Additional history provided by scanning technologist: Patient reports left-sided weakness. EXAM: MRI CERVICAL SPINE WITHOUT CONTRAST TECHNIQUE: Multiplanar, multisequence MR imaging of the cervical spine was performed. No intravenous contrast was administered. COMPARISON:  Intraoperative radiographs of the cervical spine 03/11/2022. Cervical spine CT 03/11/2022. Cervical spine MRI 02/17/2022. FINDINGS: Intermittently motion degraded examination, limiting evaluation. Most notably, there is mild-to-moderate motion degradation of the sagittal T2 TSE sequence, moderate to severe motion degradation of the least motion degraded axial T2 TSE sequence and severe motion degradation of the axial T2 GRE sequence. Alignment: Straightening of the expected cervical lordosis. Trace grade 1 anterolisthesis at C7-T1 and T1-T2. Vertebrae: Susceptibility artifact arising from ACDF hardware at the C4-C6 levels, and arising from a C5 corpectomy device. No appreciable significant marrow edema or focal suspicious osseous lesion. Facet joint ankylosis on the left at C2-C3 and bilaterally at C3-C4, better  appreciated on the prior CT. Cord: Redemonstrated focus of chronic myelomalacia within the spinal cord at the C3-C4 level. Within described limitations, no signal abnormality is appreciated within the spinal cord elsewhere. Posterior Fossa, vertebral arteries, paraspinal tissues: No abnormality identified within included portions of the posterior fossa. Flow voids preserved within the imaged cervical vertebral arteries. T2 hyperintense and T1 hypointense collection with prevertebral components, as well as components extending into the left neck soft tissues. The prevertebral component spans the C2-C6 levels and measures up to 1.5 cm in AP dimension. The component more laterally within the left neck soft tissues measures 3.2 x 5.8 cm (series 5, image 15) (series 11, images 9-23). The prevertebral component results in at least moderate narrowing of the aerodigestive tract. Disc levels: Disc degeneration at the non operative levels, greatest at C6-C7 (advanced) and C7-T1 (moderate to moderately advanced). Generally narrow cervical spinal canal. C2-C3: Shallow disc bulge. Uncovertebral hypertrophy on the left. Facet arthrosis/hypertrophy. No significant spinal canal stenosis. Bilateral neural foraminal narrowing (mild right, moderate left). C3-C4: Vertebral ankylosis. Uncovertebral hypertrophy. Facet and ligamentum flavum hypertrophy. Mild-to-moderate spinal canal stenosis. Bilateral neural foraminal narrowing (moderate right, severe left). C4-C5: Prior ACDF and C5 corpectomy. Uncovertebral hypertrophy.8 mm T2 hypointense focus within the left anterior aspect of the spinal canal, likely corresponding with the bone fragment identified at this site on the prior CT cervical spine of 03/11/2022. Facet arthrosis. The bone fragment contributes to persistent moderate/severe canal stenosis on the left with some flattening of the ventral spinal cord (series 9, image 19). Apparent severe bilateral neural foraminal narrowing,  although motion degradation significantly limits evaluation. C5-C6: Prior ACDF and C5 corpectomy. Uncovertebral hypertrophy. Facet arthrosis and ligamentum flavum hypertrophy. Moderate spinal canal stenosis. Apparent moderate bilateral neural foraminal narrowing, although motion degradation limits evaluation. C6-C7: Broad-based central disc protrusion. Bilateral disc osteophyte ridge/uncinate  hypertrophy. Facet arthrosis and ligamentum flavum thickening. Ossification of the posterior longitudinal ligament. The disc protrusion contributes to apparent moderate spinal canal stenosis, contacting and mildly flattening of the ventral spinal cord. Bilateral neural foraminal narrowing (mild right, moderate left). C7-T1: Trace grade 1 anterolisthesis. Slight disc bulge. Facet arthrosis and ligamentum flavum thickening. No significant spinal canal stenosis or neural foraminal narrowing. Impressions 1, 2 and 4 will be called to the ordering clinician or representative by the Radiologist Assistant, and communication documented in the PACS or [employer]. IMPRESSION: 1. Significantly motion degraded examination, as described and limiting evaluation. 2. Prevertebral collection, which also extends into the left neck soft tissues. The prevertebral component of the collection spans the C2-C6 levels and measures up to 1.5 cm in AP dimension. The component of the collection within the left neck soft tissues measures 3.2 cm in AP dimension and 5.8 cm in craniocaudal dimension. The prevertebral component results in at least moderate narrowing of the aerodigestive tract. 3. Prior ACDF at the C3-C6 levels and prior C5 corpectomy. ACDF hardware spans the C4-C6 levels, and a corpectomy device is present at C5. 4. At C4-C5, there is an 8 mm T2 hypointense focus within the left aspect of the spinal canal which likely corresponds with the bone fragment identified at this site on the prior CT of 03/11/2022. This bone fragment contributes  to persistent moderate/severe spinal canal stenosis to the left, with some flattening of the left aspect of the spinal cord. 5. Up to moderate spinal canal stenosis at the remaining levels. Multilevel neural foraminal stenosis, as detailed. Electronically Signed   By: Kellie Simmering D.O.   On: 03/18/2022 16:38    Assessment/Plan: 76 y.o. male who is s/p C5 corpectomy with anterior plating from C4-C6. His neurological exam is stable.   -Awaiting SNF placement  -Continue to wean Decadron with plan for discontinuation tomorrow   LOS: 9 days     Marvis Moeller, DNP, AGNP-C Neurosurgery Nurse Practitioner  [facility] [address], [address], [place], [place] [address] P: [phone]    F: [phone]  03/20/2022 12:55 PM

## 2022-03-20 NOTE — Assessment & Plan Note (Signed)
trazodone ?

## 2022-03-20 NOTE — Assessment & Plan Note (Signed)
Related to steroids Afebrile Follow

## 2022-03-20 NOTE — Progress Notes (Signed)
PROGRESS NOTE    [name]  FMB:[id number] DOB: 1946-08-30 DOA: 03/11/2022 PCP: Renee Rival, FNP  No chief complaint on file.   Brief Narrative:  76 year old male who with previous severe C3-C4 stenosis with prior decompression years prior with worsening left arm weakness with MRI demonstrating left sided cord compression secondary to ossification of the posterior longitudinal ligament creating significant cord compression on left side.  He was admitted to [facility] on 6/22 for C5 corpectomy and ACCF found to have increased weakness postoperatively, taken back to OR for evacuation of epidural hematoma and revision of anterior decompression and fixation from C4-C6.  Hospitalization complicated by ongoing dysphagia despite steroids.  Speech has been following and remains NPO except ice chips.  On 6/26, he has had increasing hypoxia with concerns for aspiration.  PCCM consulted for further recommendations.  Significant events 6/22 admitted NSGY, OR C5 corpectomy/ ACCF> takeback to OR for epidural hematoma evac, decompression and C4-C6 fixation. 6/28 SLP reevaluated swallow with NGT in place and observed improvement in swallow seen but continued to recommend NGT as primary means of nutrition support  6/29 [facility] consulting for medical issues     Assessment & Plan:   Principal Problem:   Cervical spondylosis with myelopathy Active Problems:   Acute respiratory failure with hypoxia (HCC)   Dysphagia   Leukocytosis   Hypertension   Hyperlipidemia   Type 2 diabetes mellitus (HCC)   Hypothyroidism   Protein-calorie malnutrition, severe   Insomnia   Assessment and Plan: * Cervical spondylosis with myelopathy S/p C5 cervical corpectomy with undercutting of C4 and decompression of ossification of the posterior longitudinal ligament an the spinal canal from C3-5.  Reconstruction with titanium spacer and autograft arthrodesis C4 to C6 anterior plate fixation from T0-V7 on  7/93 Complicated by hematoma with increased weakness after surgical corpectomy C5 Increased weakness to L arm today, repeat MRI C spine per nsgy (MRI motion degraded, preverebral collection which extends into the L neck soft tissues (at least moderate narrowing of aerodigestive tract).  Prior ACDF at C3-6 levels and prior C5 corpectomy.  ACDF hardware spans C4-C6 levels, Gertude Benito corpectomy device is present at C5.  C4-C5 there is 8 mm T2 hypointense focus within L aspect of spinal canal. Up to moderate spinal canal stenosis at remaining levels.  C spine Ct this AM with revision of C4-5 corpectomy and fusion, smaller residual bone fragment in the L C4 canal.  Interval stable C5-6 corpectomy and fusion appearance.  Underlying C2-3 and C3-4 ankylosis.  Chronic C6-7 and C7-T1 degeneration.  Postoperative prevertebral fluid collection, likely seroma.    Per nsg   Acute respiratory failure with hypoxia (HCC) Occurred post op  Had hematoma with increased weakness after surgical corpectomy C5 6/22 underwent evacuation of surgical hematoma and revision of anterior decompression fixation from C5 corpectomy with fixation from C4-6 PCCM consulted 6/26 with concern for worsening hypoxia and concern for aspiration Currently improved on RA, decadron per neurosurgery    Leukocytosis Related to steroids Afebrile Follow   Dysphagia Nocturnal feeds Follow PO intake NG d/c'd  Hyperlipidemia crestor  Hypertension Lasix, coreg Follow BP  Type 2 diabetes mellitus (HCC) Basal/bolus regimen ordered Will hold glimeperide while inpatient Watch while on steroids  Hypothyroidism synthroid  Protein-calorie malnutrition, severe Noted, RD  Insomnia trazodone     DVT prophylaxis: lovenox Code Status: full Family Communication: none Disposition:   Per nsgy   Consultants:  [facility] PCCM  Procedures:  Cervical corpectomy of C5 with  undercutting of C4 and decompression of ossification of the posterior  longitudinal ligament and the spinal canal from C3-C5.  Reconstruction with titanium spacer and autograft arthrodesis see 4 to see 6 anterior plate fixation from L4-Y5     Evacuation of surgical hematoma and revision of anterior decompression fixation from C5 corpectomy with fixation from C4-C6  Antimicrobials:  Anti-infectives (From admission, onward)    Start     Dose/Rate Route Frequency Ordered Stop   03/12/22 0400  ceFAZolin (ANCEF) IVPB 2g/100 mL premix        2 g 200 mL/hr over 30 Minutes Intravenous Every 8 hours 03/11/22 1818 03/12/22 1211   03/12/22 0000  ceFAZolin (ANCEF) IVPB 2g/100 mL premix  Status:  Discontinued        2 g 200 mL/hr over 30 Minutes Intravenous Every 8 hours 03/11/22 2308 03/11/22 2315   03/11/22 0915  ceFAZolin (ANCEF) IVPB 2g/100 mL premix        2 g 200 mL/hr over 30 Minutes Intravenous On call to O.R. 03/11/22 0910 03/11/22 1642   03/11/22 0914  ceFAZolin (ANCEF) 2-4 GM/100ML-% IVPB       Note to Pharmacy: [name] O: cabinet override      03/11/22 0914 03/11/22 1313       Subjective: Asking for something to help with sleep  Objective: Vitals:   03/20/22 0341 03/20/22 0500 03/20/22 0724 03/20/22 1100  BP: 104/65  (!) 157/87 (!) 145/85  Pulse: 89  89 85  Resp: '18  16 16  '$ Temp: 98 F (36.7 C)  97.8 F (36.6 C) 98 F (36.7 C)  TempSrc:   Oral Oral  SpO2: 93%  92% 98%  Weight:  85.9 kg    Height:        Intake/Output Summary (Last 24 hours) at 03/20/2022 1311 Last data filed at 03/20/2022 0619 Gross per 24 hour  Intake 260 ml  Output 1501 ml  Net -1241 ml   Filed Weights   03/18/22 0420 03/19/22 0534 03/20/22 0500  Weight: 88.6 kg 87.8 kg 85.9 kg    Examination:  General: No acute distress. Cardiovascular: RRR Lungs: unlabored Abdomen: Soft, nontender, nondistended  Neurological: L>R upper extremity weakness, overall seems stable Extremities: No clubbing or cyanosis. No edema.  Data Reviewed: I have personally reviewed  following labs and imaging studies  CBC: Recent Labs  Lab 03/19/22 0326 03/20/22 0353  WBC 19.3* 24.3*  NEUTROABS 16.5* 22.4*  HGB 10.9* 10.8*  HCT 31.2* 32.1*  MCV 99.0 96.7  PLT 276 035    Basic Metabolic Panel: Recent Labs  Lab 03/14/22 0433 03/15/22 0330 03/16/22 1453 03/17/22 0603 03/17/22 1737 03/19/22 0326 03/20/22 0353  NA 133* 138  --   --   --  135 135  K 3.9 3.4*  --   --   --  3.7 3.9  CL 100 100  --   --   --  98 95*  CO2 24 25  --   --   --  28 30  GLUCOSE 189* 167*  --   --   --  120* 135*  BUN 13 19  --   --   --  24* 27*  CREATININE 0.72 0.71  --   --   --  0.74 0.84  CALCIUM 8.6* 8.8*  --   --   --  8.4* 8.6*  MG  --   --  2.0 2.2 1.9 1.8 1.9  PHOS 2.4* 2.3* 2.3* 3.2 2.2* 4.0  4.2    GFR: Estimated Creatinine Clearance: 79.6 mL/min (by C-G formula based on SCr of 0.84 mg/dL).  Liver Function Tests: Recent Labs  Lab 03/14/22 0433 03/15/22 0330 03/19/22 0326 03/20/22 0353  AST  --   --  13* 13*  ALT  --   --  13 16  ALKPHOS  --   --  45 44  BILITOT  --   --  0.7 1.2  PROT  --   --  5.9* 6.2*  ALBUMIN 3.4* 3.2* 2.9* 2.8*    CBG: Recent Labs  Lab 03/19/22 2101 03/20/22 0025 03/20/22 0345 03/20/22 0850 03/20/22 1156  GLUCAP 168* 120* 143* 158* 165*     Recent Results (from the past 240 hour(s))  MRSA Next Gen by PCR, Nasal     Status: None   Collection Time: 03/11/22 11:03 PM   Specimen: Nasal Mucosa; Nasal Swab  Result Value Ref Range Status   MRSA by PCR Next Gen NOT DETECTED NOT DETECTED Final    Comment: (NOTE) The GeneXpert MRSA Assay (FDA approved for NASAL specimens only), is one component of Arasely Akkerman comprehensive MRSA colonization surveillance program. It is not intended to diagnose MRSA infection nor to guide or monitor treatment for MRSA infections. Test performance is not FDA approved in patients less than 69 years old. Performed at [facility], [place] [address], [place], [place] [address]          Radiology  Studies: CT CERVICAL SPINE WO CONTRAST  Result Date: 03/19/2022 CLINICAL DATA:  76 year old male with neck pain status post cervical spine corpectomy and fusion. Postoperative hematoma with increased weakness status post cervical evacuation of hematoma and revision of ACDF on 03/11/2022. EXAM: CT CERVICAL SPINE WITHOUT CONTRAST TECHNIQUE: Multidetector CT imaging of the cervical spine was performed without intravenous contrast. Multiplanar CT image reconstructions were also generated. RADIATION DOSE REDUCTION: This exam was performed according to the departmental dose-optimization program which includes automated exposure control, adjustment of the mA and/or kV according to patient size and/or use of iterative reconstruction technique. COMPARISON:  Initial postoperative CT cervical spine 03/11/2022. Cervical MRI yesterday 03/18/2022. FINDINGS: Alignment: Stable from the initial postoperative CT 03/11/2022. Mild straightening of cervical lordosis. Cervicothoracic junction alignment is within normal limits. Posterior element alignment appears maintained. Skull base and vertebrae: Visualized skull base is intact. No atlanto-occipital dissociation. C1 and C2 are intact and aligned. See postoperative and chronic degenerative osseous details below. Soft tissues and spinal canal: Prevertebral postoperative fluid or edema up to 11 mm thick from the C3 through the C6 level. And this is somewhat contiguous with fluid tracking in the left paralaryngeal space toward the skin incision (series 5, image 63) which also has simple fluid density. The more superficial collection there is about 3 cm. No soft tissue gas. Arissa Fagin nasoenteric tube is in place and courses appropriately into the upper esophagus. Mild mass effect on the posterior pharynx and larynx. Superimposed small surgical clips in the left carotid space. Disc levels: C2-C3 posterior element ankylosis and interbody ankylosis mostly on the left. C3-C4 posterior element and  interbody ankylosis. Sequelae of C3 vertebral body hardware removal. C4-C5 combined ACDF and corpectomy. Revision of the active C4 cortical screws, with unchanged small bilateral C4 cortical screw fragments. Corpectomy spacer also revised and taller. There remains Australia Droll small bone fragment in the anterior left canal at C4 (series 4, image 56) although smaller. Residual C5 uncovertebral spurring also greater on the left. Moderate to severe chronic bilateral facet hypertrophy. C5-C6  corpectomy and hardware changes appear stable. C6 cortical screws intact. Mild foraminal endplate spurring. C6-C7 disc space loss with endplate and facet spurring appears stable. C7-T1 disc space loss with endplate and facet spurring appears stable. Upper chest: Visible upper thoracic vertebrae appear intact with endplate and facet degeneration. Upper lungs are clear. Subglottic trachea is clear. Other: Negative visible noncontrast posterior fossa. IMPRESSION: 1. Revision of C4-C5 corpectomy and fusion since the CT on 03/11/2022. Smaller residual bone fragment in the left C4 canal (series 4, image 56). 2. Interval stable C5-C6 corpectomy and fusion appearance. 3. Underlying C2-C3 and C3-C4 ankylosis. Chronic C6-C7 and C7-T1 degeneration. 4. Postoperative prevertebral fluid collection, likely seroma, with similar fluid tracking along the left paralaryngeal space toward presumed skin incision site. 5. Partially visible nasoenteric tube with no adverse features. Electronically Signed   By: Genevie Ann M.D.   On: 03/19/2022 05:43   MR CERVICAL SPINE WO CONTRAST  Result Date: 03/18/2022 CLINICAL DATA:  Myelopathy, chronic, cervical spine. Additional history provided by scanning technologist: Patient reports left-sided weakness. EXAM: MRI CERVICAL SPINE WITHOUT CONTRAST TECHNIQUE: Multiplanar, multisequence MR imaging of the cervical spine was performed. No intravenous contrast was administered. COMPARISON:  Intraoperative radiographs of the  cervical spine 03/11/2022. Cervical spine CT 03/11/2022. Cervical spine MRI 02/17/2022. FINDINGS: Intermittently motion degraded examination, limiting evaluation. Most notably, there is mild-to-moderate motion degradation of the sagittal T2 TSE sequence, moderate to severe motion degradation of the least motion degraded axial T2 TSE sequence and severe motion degradation of the axial T2 GRE sequence. Alignment: Straightening of the expected cervical lordosis. Trace grade 1 anterolisthesis at C7-T1 and T1-T2. Vertebrae: Susceptibility artifact arising from ACDF hardware at the C4-C6 levels, and arising from Latesha Chesney C5 corpectomy device. No appreciable significant marrow edema or focal suspicious osseous lesion. Facet joint ankylosis on the left at C2-C3 and bilaterally at C3-C4, better appreciated on the prior CT. Cord: Redemonstrated focus of chronic myelomalacia within the spinal cord at the C3-C4 level. Within described limitations, no signal abnormality is appreciated within the spinal cord elsewhere. Posterior Fossa, vertebral arteries, paraspinal tissues: No abnormality identified within included portions of the posterior fossa. Flow voids preserved within the imaged cervical vertebral arteries. T2 hyperintense and T1 hypointense collection with prevertebral components, as well as components extending into the left neck soft tissues. The prevertebral component spans the C2-C6 levels and measures up to 1.5 cm in AP dimension. The component more laterally within the left neck soft tissues measures 3.2 x 5.8 cm (series 5, image 15) (series 11, images 9-23). The prevertebral component results in at least moderate narrowing of the aerodigestive tract. Disc levels: Disc degeneration at the non operative levels, greatest at C6-C7 (advanced) and C7-T1 (moderate to moderately advanced). Generally narrow cervical spinal canal. C2-C3: Shallow disc bulge. Uncovertebral hypertrophy on the left. Facet arthrosis/hypertrophy. No  significant spinal canal stenosis. Bilateral neural foraminal narrowing (mild right, moderate left). C3-C4: Vertebral ankylosis. Uncovertebral hypertrophy. Facet and ligamentum flavum hypertrophy. Mild-to-moderate spinal canal stenosis. Bilateral neural foraminal narrowing (moderate right, severe left). C4-C5: Prior ACDF and C5 corpectomy. Uncovertebral hypertrophy.8 mm T2 hypointense focus within the left anterior aspect of the spinal canal, likely corresponding with the bone fragment identified at this site on the prior CT cervical spine of 03/11/2022. Facet arthrosis. The bone fragment contributes to persistent moderate/severe canal stenosis on the left with some flattening of the ventral spinal cord (series 9, image 19). Apparent severe bilateral neural foraminal narrowing, although motion degradation significantly limits evaluation. C5-C6: Prior ACDF  and C5 corpectomy. Uncovertebral hypertrophy. Facet arthrosis and ligamentum flavum hypertrophy. Moderate spinal canal stenosis. Apparent moderate bilateral neural foraminal narrowing, although motion degradation limits evaluation. C6-C7: Broad-based central disc protrusion. Bilateral disc osteophyte ridge/uncinate hypertrophy. Facet arthrosis and ligamentum flavum thickening. Ossification of the posterior longitudinal ligament. The disc protrusion contributes to apparent moderate spinal canal stenosis, contacting and mildly flattening of the ventral spinal cord. Bilateral neural foraminal narrowing (mild right, moderate left). C7-T1: Trace grade 1 anterolisthesis. Slight disc bulge. Facet arthrosis and ligamentum flavum thickening. No significant spinal canal stenosis or neural foraminal narrowing. Impressions 1, 2 and 4 will be called to the ordering clinician or representative by the Radiologist Assistant, and communication documented in the PACS or [employer]. IMPRESSION: 1. Significantly motion degraded examination, as described and limiting evaluation.  2. Prevertebral collection, which also extends into the left neck soft tissues. The prevertebral component of the collection spans the C2-C6 levels and measures up to 1.5 cm in AP dimension. The component of the collection within the left neck soft tissues measures 3.2 cm in AP dimension and 5.8 cm in craniocaudal dimension. The prevertebral component results in at least moderate narrowing of the aerodigestive tract. 3. Prior ACDF at the C3-C6 levels and prior C5 corpectomy. ACDF hardware spans the C4-C6 levels, and Dorthie Santini corpectomy device is present at C5. 4. At C4-C5, there is an 8 mm T2 hypointense focus within the left aspect of the spinal canal which likely corresponds with the bone fragment identified at this site on the prior CT of 03/11/2022. This bone fragment contributes to persistent moderate/severe spinal canal stenosis to the left, with some flattening of the left aspect of the spinal cord. 5. Up to moderate spinal canal stenosis at the remaining levels. Multilevel neural foraminal stenosis, as detailed. Electronically Signed   By: Kellie Simmering D.O.   On: 03/18/2022 16:38        Scheduled Meds:  azelastine  1 spray Each Nare Q1500   carvedilol  3.125 mg Oral BID WC   Chlorhexidine Gluconate Cloth  6 each Topical Daily   dexamethasone  2 mg Oral Q12H   docusate  100 mg Oral BID   enoxaparin (LOVENOX) injection  40 mg Subcutaneous Q24H   feeding supplement (GLUCERNA 1.5 CAL)  1,000 mL Per Tube Q24H   feeding supplement (NEPRO CARB STEADY)  237 mL Oral TID BM   feeding supplement (PROSource TF)  45 mL Per Tube Daily   fluticasone  1 spray Each Nare Daily   furosemide  40 mg Oral BID   gabapentin  100 mg Oral QHS   insulin aspart  0-20 Units Subcutaneous TID WC   insulin aspart  0-5 Units Subcutaneous QHS   insulin aspart  3 Units Subcutaneous Q4H   insulin glargine-yfgn  10 Units Subcutaneous QHS   levothyroxine  88 mcg Oral Q0600   mouth rinse  15 mL Mouth Rinse 4 times per day    pantoprazole sodium  40 mg Oral QHS   potassium chloride  20 mEq Oral Daily   rOPINIRole  0.5 mg Oral TID   rosuvastatin  20 mg Oral Daily   senna  1 tablet Oral BID   sodium chloride  2 spray Each Nare BID   sodium chloride flush  3 mL Intravenous Q12H   traZODone  50 mg Oral QHS   Continuous Infusions:  sodium chloride     methocarbamol (ROBAXIN) IV       LOS: 9 days  Time spent: over 30 min    Fayrene Helper, MD [facility]   To contact the attending provider between 7A-7P or the covering provider during after hours 7P-7A, please log into the web site [email or web address] and access using universal [employer] password for that web site. If you do not have the password, please call the hospital operator.  03/20/2022, 1:11 PM

## 2022-03-21 DIAGNOSIS — M4712 Other spondylosis with myelopathy, cervical region: Secondary | ICD-10-CM | POA: Diagnosis not present

## 2022-03-21 LAB — BASIC METABOLIC PANEL
Anion gap: 12 (ref 5–15)
BUN: 29 mg/dL — ABNORMAL HIGH (ref 8–23)
CO2: 27 mmol/L (ref 22–32)
Calcium: 8.3 mg/dL — ABNORMAL LOW (ref 8.9–10.3)
Chloride: 95 mmol/L — ABNORMAL LOW (ref 98–111)
Creatinine, Ser: 0.98 mg/dL (ref 0.61–1.24)
GFR, Estimated: >60 mL/min (ref >60)
Glucose, Bld: 205 mg/dL — ABNORMAL HIGH (ref 70–99)
Potassium: 3.6 mmol/L (ref 3.5–5.1)
Sodium: 134 mmol/L — ABNORMAL LOW (ref 135–145)

## 2022-03-21 LAB — CBC WITH DIFFERENTIAL/PLATELET
Abs Immature Granulocytes: 0.31 10*3/uL — ABNORMAL HIGH (ref 0.00–0.07)
Basophils Absolute: 0 10*3/uL (ref 0.0–0.1)
Basophils Relative: 0 %
Eosinophils Absolute: 0 10*3/uL (ref 0.0–0.5)
Eosinophils Relative: 0 %
HCT: 31.8 % — ABNORMAL LOW (ref 39.0–52.0)
Hemoglobin: 10.6 g/dL — ABNORMAL LOW (ref 13.0–17.0)
Immature Granulocytes: 2 %
Lymphocytes Relative: 6 %
Lymphs Abs: 0.9 10*3/uL (ref 0.7–4.0)
MCH: 33 pg (ref 26.0–34.0)
MCHC: 33.3 g/dL (ref 30.0–36.0)
MCV: 99.1 fL (ref 80.0–100.0)
Monocytes Absolute: 0.9 10*3/uL (ref 0.1–1.0)
Monocytes Relative: 6 %
Neutro Abs: 14.3 10*3/uL — ABNORMAL HIGH (ref 1.7–7.7)
Neutrophils Relative %: 86 %
Platelets: 319 10*3/uL (ref 150–400)
RBC: 3.21 MIL/uL — ABNORMAL LOW (ref 4.22–5.81)
RDW: 16.7 % — ABNORMAL HIGH (ref 11.5–15.5)
WBC: 16.5 10*3/uL — ABNORMAL HIGH (ref 4.0–10.5)
nRBC: 0.2 % (ref 0.0–0.2)

## 2022-03-21 LAB — GLUCOSE, CAPILLARY
Glucose-Capillary: 144 mg/dL — ABNORMAL HIGH (ref 70–99)
Glucose-Capillary: 132 mg/dL — ABNORMAL HIGH (ref 70–99)
Glucose-Capillary: 191 mg/dL — ABNORMAL HIGH (ref 70–99)
Glucose-Capillary: 206 mg/dL — ABNORMAL HIGH (ref 70–99)

## 2022-03-21 LAB — MAGNESIUM: Magnesium: 2 mg/dL (ref 1.7–2.4)

## 2022-03-21 LAB — COMPREHENSIVE METABOLIC PANEL
ALT: 16 U/L (ref 0–44)
AST: 14 U/L — ABNORMAL LOW (ref 15–41)
Albumin: 3 g/dL — ABNORMAL LOW (ref 3.5–5.0)
Alkaline Phosphatase: 45 U/L (ref 38–126)
Anion gap: 9 (ref 5–15)
BUN: 30 mg/dL — ABNORMAL HIGH (ref 8–23)
CO2: 29 mmol/L (ref 22–32)
Calcium: 8.4 mg/dL — ABNORMAL LOW (ref 8.9–10.3)
Chloride: 98 mmol/L (ref 98–111)
Creatinine, Ser: 0.93 mg/dL (ref 0.61–1.24)
GFR, Estimated: >60 mL/min (ref >60)
Glucose, Bld: 135 mg/dL — ABNORMAL HIGH (ref 70–99)
Potassium: 3.6 mmol/L (ref 3.5–5.1)
Sodium: 136 mmol/L (ref 135–145)
Total Bilirubin: 1.1 mg/dL (ref 0.3–1.2)
Total Protein: 6.2 g/dL — ABNORMAL LOW (ref 6.5–8.1)

## 2022-03-21 LAB — PHOSPHORUS: Phosphorus: 4.5 mg/dL (ref 2.5–4.6)

## 2022-03-21 NOTE — Progress Notes (Signed)
Speech Language Pathology Treatment: Dysphagia  Patient Details Name: [name] MRN: [id number] DOB: [birth year] Today's Date: 03/21/2022 Time: 2671-2458 SLP Time Calculation (min) (ACUTE ONLY): 12 min  Assessment / Plan / Recommendation Clinical Impression  [name] has been removed since last seen by SLP. He is sleepy today but participatory. He reports ongoing improvements with swallowing. He was assisted with eating a banana and nectar thick liquids - he demonstrated no coughing/throat clearing. He reported no sense of residue in throat as he so often described several days ago.  He is making excellent progress.  Continue mechanical solids/dysphagia 3 and nectar thick liquids.  Will continue to follow.   HPI HPI: [name] is a 76 year old male who has had a previous a severe stenosis at C3-C4 which Dr Ellene Route decompressed years ago.  The patient had developed some increasing weakness in his left arm was particularly and the recent MRI demonstrated the presence of left-sided cord compression secondary to ossification of the posterior longitudinal ligament creating significant cord compression on that left side.   He had undergone decompression earlier on 6/22 with fixation from C4-C6, but postoperatively was noted to be weak. CT scan was performed and this demonstrated presence of some blood in the epidural space and some increased soft tissue swelling also suggested of blood and there was some evidence of residual bone still in the canal superiorly at the level of C4.  He was taken back to the operating room to undergo revision and evacuation. Hardware removed with more removal of bone with a longer cage placed. Pt observed to cough with pills given in am on 6/23.      SLP Plan  Continue with current plan of care      Recommendations for follow up therapy are one component of a multi-disciplinary discharge planning process, led by the attending physician.  Recommendations may  be updated based on patient status, additional functional criteria and insurance authorization.    Recommendations  Diet recommendations: Dysphagia 3 (mechanical soft);Nectar-thick liquid Liquids provided via: Straw;Cup Medication Administration: Crushed with puree Supervision: Staff to assist with self feeding Compensations: Slow rate;Small sips/bites Postural Changes and/or Swallow Maneuvers: Seated upright 90 degrees                Oral Care Recommendations: Oral care BID Follow Up Recommendations: Other (comment) (plan is for SNF) Assistance recommended at discharge: Frequent or constant Supervision/Assistance SLP Visit Diagnosis: Dysphagia, oropharyngeal phase (R13.12) Plan: Continue with current plan of care         Raymonda Pell L. Tivis Ringer, MA CCC/SLP Clinical Specialist - Acute Care SLP Acute Rehabilitation Services Office number [phone]   Juan Quam Laurice  03/21/2022, 11:49 AM

## 2022-03-21 NOTE — Progress Notes (Signed)
Subjective: Patient reports that he feels better this morning. He has upper back and low back pain. No acute events overnight.  Objective: Vital signs in last 24 hours: Temp:  [97.5 F (36.4 C)-98.5 F (36.9 C)] 97.8 F (36.6 C) (07/02 0755) Pulse Rate:  [83-88] 83 (07/02 0755) Resp:  [16] 16 (07/02 0444) BP: (114-145)/(62-85) 140/62 (07/02 0755) SpO2:  [94 %-100 %] 94 % (07/02 0755)  Intake/Output from previous day: 07/01 0701 - 07/02 0700 In: -  Out: 700 [Urine:700] Intake/Output this shift: Total I/O In: -  Out: 300 [Urine:300]  Physical Exam: Patient is awake, A/O X 4, and conversant. Eyes open spontaneously. They are in NAD and VSS. Doing well. Speech is fluent and appropriate. MAEW except LUE 4-/5. Sensation to light touch is intact. PERLA, EOMI. CNs grossly intact. Dressing is clean dry intact.  Lab Results: Recent Labs    03/20/22 0353 03/21/22 0304  WBC 24.3* 16.5*  HGB 10.8* 10.6*  HCT 32.1* 31.8*  PLT 301 319   BMET Recent Labs    03/20/22 0353 03/21/22 0304  NA 135 136  K 3.9 3.6  CL 95* 98  CO2 30 29  GLUCOSE 135* 135*  BUN 27* 30*  CREATININE 0.84 0.93  CALCIUM 8.6* 8.4*    Studies/Results: No results found.  Assessment/Plan: 76 y.o. male who is s/p C5 corpectomy with anterior plating from C4-C6. His neurological exam continues to be stable.    -Awaiting SNF placement  -Discontinue Decadron today   LOS: 10 days     Marvis Moeller, DNP, AGNP-C Neurosurgery Nurse Practitioner  [facility] [place]. [address], [place], [place] [address] P: [phone]    F: [phone]  03/21/2022 10:07 AM

## 2022-03-21 NOTE — Progress Notes (Signed)
PROGRESS NOTE    [name]  ZCH:[id number] DOB: Apr 27, 1946 DOA: 03/11/2022 PCP: Renee Rival, FNP  No chief complaint on file.   Brief Narrative:  76 year old male who with previous severe C3-C4 stenosis with prior decompression years prior with worsening left arm weakness with MRI demonstrating left sided cord compression secondary to ossification of the posterior longitudinal ligament creating significant cord compression on left side.  He was admitted to [place] on 6/22 for C5 corpectomy and ACCF found to have increased weakness postoperatively, taken back to OR for evacuation of epidural hematoma and revision of anterior decompression and fixation from C4-C6.  Hospitalization complicated by ongoing dysphagia despite steroids.  Speech has been following and remains NPO except ice chips.  On 6/26, he has had increasing hypoxia with concerns for aspiration.  PCCM consulted for further recommendations.  Significant events 6/22 admitted NSGY, OR C5 corpectomy/ ACCF> takeback to OR for epidural hematoma evac, decompression and C4-C6 fixation. 6/28 SLP reevaluated swallow with NGT in place and observed improvement in swallow seen but continued to recommend NGT as primary means of nutrition support  6/29 [facility] consulting for medical issues     Assessment & Plan:   Principal Problem:   Cervical spondylosis with myelopathy Active Problems:   Acute respiratory failure with hypoxia (HCC)   Dysphagia   Leukocytosis   Hypertension   Hyperlipidemia   Type 2 diabetes mellitus (HCC)   Hypothyroidism   Protein-calorie malnutrition, severe   Insomnia   Assessment and Plan: * Cervical spondylosis with myelopathy S/p C5 cervical corpectomy with undercutting of C4 and decompression of ossification of the posterior longitudinal ligament an the spinal canal from C3-5.  Reconstruction with titanium spacer and autograft arthrodesis C4 to C6 anterior plate fixation from O8-N8 on  6/76 Complicated by hematoma with increased weakness after surgical corpectomy C5 Increased weakness to L arm today, repeat MRI C spine per nsgy (MRI motion degraded, preverebral collection which extends into the L neck soft tissues (at least moderate narrowing of aerodigestive tract).  Prior ACDF at C3-6 levels and prior C5 corpectomy.  ACDF hardware spans C4-C6 levels, Vera Wishart corpectomy device is present at C5.  C4-C5 there is 8 mm T2 hypointense focus within L aspect of spinal canal. Up to moderate spinal canal stenosis at remaining levels.  C spine Ct this AM with revision of C4-5 corpectomy and fusion, smaller residual bone fragment in the L C4 canal.  Interval stable C5-6 corpectomy and fusion appearance.  Underlying C2-3 and C3-4 ankylosis.  Chronic C6-7 and C7-T1 degeneration.  Postoperative prevertebral fluid collection, likely seroma.    Per nsg   Acute respiratory failure with hypoxia ([place]) Occurred post op  Had hematoma with increased weakness after surgical corpectomy C5 6/22 underwent evacuation of surgical hematoma and revision of anterior decompression fixation from C5 corpectomy with fixation from C4-6 PCCM consulted 6/26 with concern for worsening hypoxia and concern for aspiration Currently improved on RA, decadron per neurosurgery    Leukocytosis Related to steroids Afebrile Follow   Dysphagia Nocturnal feeds Follow PO intake NG d/c'd  Hyperlipidemia crestor  Hypertension Lasix, coreg Follow BP  Type 2 diabetes mellitus (HCC) Basal/bolus regimen ordered Will hold glimeperide while inpatient Watch while on steroids, nsgy d/cing today  Hypothyroidism synthroid  Protein-calorie malnutrition, severe Noted, RD  Insomnia trazodone     DVT prophylaxis: lovenox Code Status: full Family Communication: none Disposition:   Per nsgy   Consultants:  TRH PCCM  Procedures:  Cervical corpectomy  of C5 with undercutting of C4 and decompression of  ossification of the posterior longitudinal ligament and the spinal canal from C3-C5.  Reconstruction with titanium spacer and autograft arthrodesis see 4 to see 6 anterior plate fixation from Q7-H4     Evacuation of surgical hematoma and revision of anterior decompression fixation from C5 corpectomy with fixation from C4-C6  Antimicrobials:  Anti-infectives (From admission, onward)    Start     Dose/Rate Route Frequency Ordered Stop   03/12/22 0400  ceFAZolin (ANCEF) IVPB 2g/100 mL premix        2 g 200 mL/hr over 30 Minutes Intravenous Every 8 hours 03/11/22 1818 03/12/22 1211   03/12/22 0000  ceFAZolin (ANCEF) IVPB 2g/100 mL premix  Status:  Discontinued        2 g 200 mL/hr over 30 Minutes Intravenous Every 8 hours 03/11/22 2308 03/11/22 2315   03/11/22 0915  ceFAZolin (ANCEF) IVPB 2g/100 mL premix        2 g 200 mL/hr over 30 Minutes Intravenous On call to O.R. 03/11/22 0910 03/11/22 1642   03/11/22 0914  ceFAZolin (ANCEF) 2-4 GM/100ML-% IVPB       Note to Pharmacy: [name] O: cabinet override      03/11/22 0914 03/11/22 1313       Subjective: Denies complaints, sleeping initially   Objective: Vitals:   03/20/22 2125 03/21/22 0012 03/21/22 0444 03/21/22 0755  BP: 134/73 124/66 114/71 140/62  Pulse: 86 88 85 83  Resp: '16 16 16   '$ Temp: 97.9 F (36.6 C) 97.8 F (36.6 C) (!) 97.5 F (36.4 C) 97.8 F (36.6 C)  TempSrc: Oral Oral Oral Oral  SpO2: 98% 94% 100% 94%  Weight:      Height:        Intake/Output Summary (Last 24 hours) at 03/21/2022 1601 Last data filed at 03/21/2022 1200 Gross per 24 hour  Intake 360 ml  Output 1000 ml  Net -640 ml   Filed Weights   03/18/22 0420 03/19/22 0534 03/20/22 0500  Weight: 88.6 kg 87.8 kg 85.9 kg    Examination:  General: No acute distress. Dressing to anterior neck Lungs: unlabored Abdomen: Soft, nontender, nondistended  Neurological: sleeping, awakens easily Extremities: No clubbing or cyanosis. No edema.    Data Reviewed: I have personally reviewed following labs and imaging studies  CBC: Recent Labs  Lab 03/19/22 0326 03/20/22 0353 03/21/22 0304  WBC 19.3* 24.3* 16.5*  NEUTROABS 16.5* 22.4* 14.3*  HGB 10.9* 10.8* 10.6*  HCT 31.2* 32.1* 31.8*  MCV 99.0 96.7 99.1  PLT 276 301 193    Basic Metabolic Panel: Recent Labs  Lab 03/15/22 0330 03/16/22 1453 03/17/22 0603 03/17/22 1737 03/19/22 0326 03/20/22 0353 03/21/22 0304  NA 138  --   --   --  135 135 136  K 3.4*  --   --   --  3.7 3.9 3.6  CL 100  --   --   --  98 95* 98  CO2 25  --   --   --  '28 30 29  '$ GLUCOSE 167*  --   --   --  120* 135* 135*  BUN 19  --   --   --  24* 27* 30*  CREATININE 0.71  --   --   --  0.74 0.84 0.93  CALCIUM 8.8*  --   --   --  8.4* 8.6* 8.4*  MG  --    < > 2.2 1.9 1.8  1.9 2.0  PHOS 2.3*   < > 3.2 2.2* 4.0 4.2 4.5   < > = values in this interval not displayed.    GFR: Estimated Creatinine Clearance: 71.9 mL/min (by C-G formula based on SCr of 0.93 mg/dL).  Liver Function Tests: Recent Labs  Lab 03/15/22 0330 03/19/22 0326 03/20/22 0353 03/21/22 0304  AST  --  13* 13* 14*  ALT  --  '13 16 16  '$ ALKPHOS  --  45 44 45  BILITOT  --  0.7 1.2 1.1  PROT  --  5.9* 6.2* 6.2*  ALBUMIN 3.2* 2.9* 2.8* 3.0*    CBG: Recent Labs  Lab 03/20/22 1156 03/20/22 1704 03/20/22 2109 03/21/22 0934 03/21/22 1138  GLUCAP 165* 217* 105* 144* 132*     Recent Results (from the past 240 hour(s))  MRSA Next Gen by PCR, Nasal     Status: None   Collection Time: 03/11/22 11:03 PM   Specimen: Nasal Mucosa; Nasal Swab  Result Value Ref Range Status   MRSA by PCR Next Gen NOT DETECTED NOT DETECTED Final    Comment: (NOTE) The GeneXpert MRSA Assay (FDA approved for NASAL specimens only), is one component of Andreas Sobolewski comprehensive MRSA colonization surveillance program. It is not intended to diagnose MRSA infection nor to guide or monitor treatment for MRSA infections. Test performance is not FDA approved in  patients less than 32 years old. Performed at [facility], [place] [address] [place], [place] [address]          Radiology Studies: No results found.      Scheduled Meds:  azelastine  1 spray Each Nare Q1500   carvedilol  3.125 mg Oral BID WC   Chlorhexidine Gluconate Cloth  6 each Topical Daily   docusate  100 mg Oral BID   enoxaparin (LOVENOX) injection  40 mg Subcutaneous Q24H   feeding supplement (GLUCERNA 1.5 CAL)  1,000 mL Per Tube Q24H   feeding supplement (NEPRO CARB STEADY)  237 mL Oral TID BM   feeding supplement (PROSource TF)  45 mL Per Tube Daily   fluticasone  1 spray Each Nare Daily   furosemide  40 mg Oral BID   gabapentin  100 mg Oral QHS   insulin aspart  0-20 Units Subcutaneous TID WC   insulin aspart  0-5 Units Subcutaneous QHS   insulin aspart  3 Units Subcutaneous Q4H   insulin glargine-yfgn  10 Units Subcutaneous QHS   levothyroxine  88 mcg Oral Q0600   mouth rinse  15 mL Mouth Rinse 4 times per day   pantoprazole sodium  40 mg Oral QHS   potassium chloride  20 mEq Oral Daily   rOPINIRole  0.5 mg Oral TID   rosuvastatin  20 mg Oral Daily   senna  1 tablet Oral BID   sodium chloride  2 spray Each Nare BID   sodium chloride flush  3 mL Intravenous Q12H   traZODone  50 mg Oral QHS   Continuous Infusions:  sodium chloride     methocarbamol (ROBAXIN) IV       LOS: 10 days    Time spent: over 30 min    Fayrene Helper, MD [facility]   To contact the attending provider between 7A-7P or the covering provider during after hours 7P-7A, please log into the web site [email or web address] and access using universal [place] password for that web site. If you do not have the password, please call the hospital operator.  03/21/2022,  4:01 PM

## 2022-03-22 DIAGNOSIS — M4712 Other spondylosis with myelopathy, cervical region: Secondary | ICD-10-CM | POA: Diagnosis not present

## 2022-03-22 LAB — CBC WITH DIFFERENTIAL/PLATELET
Abs Immature Granulocytes: 0.23 10*3/uL — ABNORMAL HIGH (ref 0.00–0.07)
Basophils Absolute: 0 10*3/uL (ref 0.0–0.1)
Basophils Relative: 0 %
Eosinophils Absolute: 0 10*3/uL (ref 0.0–0.5)
Eosinophils Relative: 0 %
HCT: 30.7 % — ABNORMAL LOW (ref 39.0–52.0)
Hemoglobin: 10.3 g/dL — ABNORMAL LOW (ref 13.0–17.0)
Immature Granulocytes: 1 %
Lymphocytes Relative: 3 %
Lymphs Abs: 0.6 10*3/uL — ABNORMAL LOW (ref 0.7–4.0)
MCH: 33.3 pg (ref 26.0–34.0)
MCHC: 33.6 g/dL (ref 30.0–36.0)
MCV: 99.4 fL (ref 80.0–100.0)
Monocytes Absolute: 1.2 10*3/uL — ABNORMAL HIGH (ref 0.1–1.0)
Monocytes Relative: 6 %
Neutro Abs: 17.4 10*3/uL — ABNORMAL HIGH (ref 1.7–7.7)
Neutrophils Relative %: 90 %
Platelets: 324 10*3/uL (ref 150–400)
RBC: 3.09 MIL/uL — ABNORMAL LOW (ref 4.22–5.81)
RDW: 16.8 % — ABNORMAL HIGH (ref 11.5–15.5)
WBC: 19.4 10*3/uL — ABNORMAL HIGH (ref 4.0–10.5)
nRBC: 0 % (ref 0.0–0.2)

## 2022-03-22 LAB — MAGNESIUM: Magnesium: 2.2 mg/dL (ref 1.7–2.4)

## 2022-03-22 LAB — BASIC METABOLIC PANEL
Anion gap: 8 (ref 5–15)
BUN: 29 mg/dL — ABNORMAL HIGH (ref 8–23)
CO2: 29 mmol/L (ref 22–32)
Calcium: 8.2 mg/dL — ABNORMAL LOW (ref 8.9–10.3)
Chloride: 98 mmol/L (ref 98–111)
Creatinine, Ser: 0.91 mg/dL (ref 0.61–1.24)
GFR, Estimated: >60 mL/min (ref >60)
Glucose, Bld: 143 mg/dL — ABNORMAL HIGH (ref 70–99)
Potassium: 3.5 mmol/L (ref 3.5–5.1)
Sodium: 135 mmol/L (ref 135–145)

## 2022-03-22 LAB — GLUCOSE, CAPILLARY
Glucose-Capillary: 101 mg/dL — ABNORMAL HIGH (ref 70–99)
Glucose-Capillary: 102 mg/dL — ABNORMAL HIGH (ref 70–99)
Glucose-Capillary: 115 mg/dL — ABNORMAL HIGH (ref 70–99)
Glucose-Capillary: 138 mg/dL — ABNORMAL HIGH (ref 70–99)
Glucose-Capillary: 202 mg/dL — ABNORMAL HIGH (ref 70–99)
Glucose-Capillary: 222 mg/dL — ABNORMAL HIGH (ref 70–99)
Glucose-Capillary: 260 mg/dL — ABNORMAL HIGH (ref 70–99)

## 2022-03-22 LAB — PHOSPHORUS: Phosphorus: 3.8 mg/dL (ref 2.5–4.6)

## 2022-03-22 MED ORDER — ACETAMINOPHEN 650 MG RE SUPP: 650.0000 mg | RECTAL | Status: DC | PRN

## 2022-03-22 MED ORDER — ACETAMINOPHEN 325 MG PO TABS
650.0000 mg | ORAL_TABLET | ORAL | Status: DC | PRN
  Administered 2022-03-22 – 2022-03-23 (×3): 650 mg via ORAL
  Filled 2022-03-22 (×3): qty 2

## 2022-03-22 MED ORDER — POLYETHYLENE GLYCOL 3350 17 G PO PACK: 17.0000 g | PACK | Freq: Every day | ORAL | Status: DC | PRN

## 2022-03-22 NOTE — Progress Notes (Signed)
Speech Language Pathology Treatment: Dysphagia  Patient Details Name: [name] MRN: [id number] DOB: Mar 08, 1946 Today's Date: 03/22/2022 Time: 1225-1255 SLP Time Calculation (min) (ACUTE ONLY): 30 min  Assessment / Plan / Recommendation Clinical Impression  Fed pt his lunch meal. Pt had a rough morning, feeling unwell after missing most of his breakfast. Pt had just worked with PT and BP low at edge of bed and couldn't progress to chair. Pt remained upright in bed and several minutes after PT session BP was 120/58. Pt required total assisted feeding for energy conservation today. He preferred softer moistened foods on tray. Though he tries the cut up grilled chicken it was much too dry and he had to spit it out. Pt is using good discretion with solids. SLP offered sips of thin coke or water throughout meal. Pt did have 2 episodes of coughing after consecutive sips, but cough quite hard and productive. Suspect pt clearing airway of penetrates, though this is subjective. Recommend a trial of thin liquids to determine tolerance prior to d/c.   HPI        SLP Plan  Continue with current plan of care      Recommendations for follow up therapy are one component of a multi-disciplinary discharge planning process, led by the attending physician.  Recommendations may be updated based on patient status, additional functional criteria and insurance authorization.    Recommendations  Diet recommendations: Dysphagia 3 (mechanical soft);Thin liquid Liquids provided via: Straw;Cup Medication Administration: Crushed with puree Supervision: Staff to assist with self feeding Compensations: Slow rate;Small sips/bites Postural Changes and/or Swallow Maneuvers: Seated upright 90 degrees                Oral Care Recommendations: Oral care BID Follow Up Recommendations: Other (comment) Assistance recommended at discharge: Frequent or constant Supervision/Assistance SLP Visit Diagnosis: Dysphagia,  oropharyngeal phase (R13.12) Plan: Continue with current plan of care           [name], Katherene Ponto  03/22/2022, 1:15 PM

## 2022-03-22 NOTE — TOC Progression Note (Signed)
Transition of Care [facility]) - Progression Note    Patient Details  Name: [name] MRN: [id number] Date of Birth: Jan 19, 1946  Transition of Care [facility]) CM/SW Contact  Coralee Pesa, [place] Phone Number: 03/22/2022, 11:16 AM  Clinical Narrative:    CSW notified pt has been approved for 7 days to go to [facility]. SNF#: [id number] Auth#: [facility] noting they will likely have a bed tomorrow. MD notified, attempted to update spouse, VM full.   Expected Discharge Plan: Skilled Nursing Facility Barriers to Discharge: Continued Medical Work up, Orthoptist and Services Expected Discharge Plan: [place]     Post Acute Care Choice: [place]                                         Social Determinants of Health (SDOH) Interventions    Readmission Risk Interventions     No data to display

## 2022-03-22 NOTE — Progress Notes (Signed)
Physical Therapy Treatment Patient Details Name: [name] MRN: [id number] DOB: [birth year] Today's Date: 03/22/2022   History of Present Illness 76 yo male presenting 6/22 for cervical corpectomy of C5 with undercutting of C4 and decompression of ossification of the posterior longitudinal ligament and the spinal canal from C3-C5. PMH includes: prior decompression of C3-4, HTN, DM II    PT Comments    Session limited by pt becoming dizzy and pale upon sitting on EOB. Returned to supine with delay in getting his BP, but ultimately 120/63 supine. Elevated HOB to 50 degrees with BP dropping to 98/54, however pt denied dizziness. Patient left with SLP to attempt swallowing thin liquids. Dynamap left with SLP to recheck BP if symptoms worsen.     Recommendations for follow up therapy are one component of a multi-disciplinary discharge planning process, led by the attending physician.  Recommendations may be updated based on patient status, additional functional criteria and insurance authorization.  Follow Up Recommendations  Skilled nursing-short term rehab (<3 hours/day) (denied by [facility]) Can patient physically be transported by private vehicle: No (has been inconsistent based on level of fatigue lately)   Assistance Recommended at Discharge Frequent or constant Supervision/Assistance  Patient can return home with the following A lot of help with bathing/dressing/bathroom;Assistance with cooking/housework;Direct supervision/assist for medications management;Direct supervision/assist for financial management;Assist for transportation;Help with stairs or ramp for entrance;A lot of help with walking and/or transfers   Equipment Recommendations  Other (comment) (TBA)    Recommendations for Other Services       Precautions / Restrictions Precautions Precautions: Cervical Precaution Booklet Issued: Yes (comment) Precaution Comments: needs reminders to maintain Required Braces or Orthoses:  Other Brace Other Brace: no brace needed per order set Restrictions Weight Bearing Restrictions: No     Mobility  Bed Mobility Overal bed mobility: Needs Assistance Bed Mobility: Rolling, Sidelying to Sit Rolling: Mod assist Sidelying to sit: Mod assist, HOB elevated, +2 for physical assistance     Sit to sidelying: Mod assist, +2 for physical assistance General bed mobility comments: ModA to manage trunk and primarily to lift legs onto bed.    Transfers                   General transfer comment: became dizzy and pale at EOB and had to return to supine    Ambulation/Gait                   Stairs             Wheelchair Mobility    Modified Rankin (Stroke Patients Only)       Balance Overall balance assessment: Needs assistance Sitting-balance support: Feet supported, Bilateral upper extremity supported, Single extremity supported Sitting balance-Leahy Scale: Poor Sitting balance - Comments: MinA and UE support to sit statically EOB Postural control: Posterior lean                                  Cognition Arousal/Alertness: Awake/alert Behavior During Therapy: WFL for tasks assessed/performed Overall Cognitive Status: Impaired/Different from baseline Area of Impairment: Problem solving, Memory, Awareness                   Current Attention Level: Selective Memory: Decreased recall of precautions, Decreased short-term memory Following Commands: Follows one step commands with increased time Safety/Judgement: Decreased awareness of safety, Decreased awareness of deficits Awareness: Emergent Problem Solving: Slow processing, Decreased  initiation, Difficulty sequencing, Requires verbal cues General Comments: Pt needs cues to sequence all mobility, extra time for processing. Does not recall being told in session that he cannot have coke to drink until SLP clears him--repeatedly requests coke        Exercises General  Exercises - Lower Extremity Ankle Circles/Pumps: AROM, Both, 10 reps Long Arc Quad:  (x30 reps) Heel Slides: AAROM, Both, 10 reps, Supine (resisted extension) Hip ABduction/ADduction: AAROM, Both, 5 reps, Supine    General Comments        Pertinent Vitals/Pain Pain Assessment Pain Assessment: Faces Faces Pain Scale: Hurts little more Pain Location: generalized with mobility Pain Descriptors / Indicators: Grimacing, Sore    Home Living                          Prior Function            PT Goals (current goals can now be found in the care plan section) Acute Rehab PT Goals Patient Stated Goal: to go back to bed Time For Goal Achievement: 03/26/22 Potential to Achieve Goals: Good Progress towards PT goals: Not progressing toward goals - comment    Frequency    Min 5X/week      PT Plan Current plan remains appropriate    Co-evaluation              AM-PAC PT "6 Clicks" Mobility   Outcome Measure  Help needed turning from your back to your side while in a flat bed without using bedrails?: A Lot Help needed moving from lying on your back to sitting on the side of a flat bed without using bedrails?: Total Help needed moving to and from a bed to a chair (including a wheelchair)?: Total Help needed standing up from a chair using your arms (e.g., wheelchair or bedside chair)?: Total Help needed to walk in hospital room?: Total Help needed climbing 3-5 steps with a railing? : Total 6 Click Score: 7    End of Session Equipment Utilized During Treatment: Gait belt Activity Tolerance: Treatment limited secondary to medical complications (Comment) (drop in BP; dizziness) Patient left: with call bell/phone within reach;in bed;with bed alarm set;Other (comment) (with SLP)   PT Visit Diagnosis: Unsteadiness on feet (R26.81);Other abnormalities of gait and mobility (R26.89);History of falling (Z91.81);Muscle weakness (generalized) (M62.81);Difficulty in walking,  not elsewhere classified (R26.2);Other symptoms and signs involving the nervous system (R29.898) Pain - Right/Left:  (bil) Pain - part of body: Shoulder     Time: 5038-8828 PT Time Calculation (min) (ACUTE ONLY): 23 min  Charges:  $Therapeutic Exercise: 8-22 mins $Therapeutic Activity: 8-22 mins                      Arby Barrette, PT Acute Rehabilitation Services  Office [phone]    Rexanne Mano 03/22/2022, 1:14 PM

## 2022-03-22 NOTE — Progress Notes (Signed)
Consult NOTE    [name]  BOF:[id number] DOB: 1946/01/12 DOA: 03/11/2022 PCP: Renee Rival, FNP  No chief complaint on file.   Brief Narrative:  76 year old male who with previous severe C3-C4 stenosis with prior decompression years prior with worsening left arm weakness with MRI demonstrating left sided cord compression secondary to ossification of the posterior longitudinal ligament creating significant cord compression on left side.  He was admitted to [place] on 6/22 for C5 corpectomy and ACCF found to have increased weakness postoperatively, taken back to OR for evacuation of epidural hematoma and revision of anterior decompression and fixation from C4-C6.  Hospitalization complicated by ongoing dysphagia despite steroids.  Speech has been following and remains NPO except ice chips.  On 6/26, he has had increasing hypoxia with concerns for aspiration.  PCCM consulted for further recommendations.  Significant events 6/22 admitted NSGY, OR C5 corpectomy/ ACCF> takeback to OR for epidural hematoma evac, decompression and C4-C6 fixation. 6/28 SLP reevaluated swallow with NGT in place and observed improvement in swallow seen but continued to recommend NGT as primary means of nutrition support  6/29 [facility] consulting for medical issues     Assessment & Plan:   Principal Problem:   Cervical spondylosis with myelopathy Active Problems:   Acute respiratory failure with hypoxia (HCC)   Dysphagia   Leukocytosis   Hypertension   Hyperlipidemia   Type 2 diabetes mellitus (HCC)   Hypothyroidism   Protein-calorie malnutrition, severe   Insomnia   Assessment and Plan: * Cervical spondylosis with myelopathy S/p C5 cervical corpectomy with undercutting of C4 and decompression of ossification of the posterior longitudinal ligament an the spinal canal from C3-5.  Reconstruction with titanium spacer and autograft arthrodesis C4 to C6 anterior plate fixation from D7-O2 on 4/23 Complicated  by hematoma with increased weakness after surgical corpectomy C5 Increased weakness to L arm today, repeat MRI C spine per nsgy (MRI motion degraded, preverebral collection which extends into the L neck soft tissues (at least moderate narrowing of aerodigestive tract).  Prior ACDF at C3-6 levels and prior C5 corpectomy.  ACDF hardware spans C4-C6 levels, [place] Hemric corpectomy device is present at C5.  C4-C5 there is 8 mm T2 hypointense focus within L aspect of spinal canal. Up to moderate spinal canal stenosis at remaining levels.  C spine Ct this AM with revision of C4-5 corpectomy and fusion, smaller residual bone fragment in the L C4 canal.  Interval stable C5-6 corpectomy and fusion appearance.  Underlying C2-3 and C3-4 ankylosis.  Chronic C6-7 and C7-T1 degeneration.  Postoperative prevertebral fluid collection, likely seroma.    Per nsg   Acute respiratory failure with hypoxia ([place]) Occurred post op  Had hematoma with increased weakness after surgical corpectomy C5 6/22 underwent evacuation of surgical hematoma and revision of anterior decompression fixation from C5 corpectomy with fixation from C4-6 PCCM consulted 6/26 with concern for worsening hypoxia and concern for aspiration Currently improved on RA, decadron per neurosurgery    Leukocytosis Related to steroids Afebrile Follow   Dysphagia Nocturnal feeds Follow PO intake NG d/c'd  Hyperlipidemia crestor  Hypertension Lasix, coreg Follow BP  Type 2 diabetes mellitus (HCC) Basal/bolus regimen ordered Will hold glimeperide while inpatient Watch while on steroids, nsgy d/cing today  Hypothyroidism synthroid  Protein-calorie malnutrition, severe Noted, RD  Insomnia trazodone     DVT prophylaxis: lovenox Code Status: full Family Communication: none Disposition:   Per nsgy   Consultants:  TRH PCCM  Procedures:  Cervical corpectomy  of C5 with undercutting of C4 and decompression of ossification of the  posterior longitudinal ligament and the spinal canal from C3-C5.  Reconstruction with titanium spacer and autograft arthrodesis see 4 to see 6 anterior plate fixation from J2-E2     Evacuation of surgical hematoma and revision of anterior decompression fixation from C5 corpectomy with fixation from C4-C6  Antimicrobials:  Anti-infectives (From admission, onward)    Start     Dose/Rate Route Frequency Ordered Stop   03/12/22 0400  ceFAZolin (ANCEF) IVPB 2g/100 mL premix        2 g 200 mL/hr over 30 Minutes Intravenous Every 8 hours 03/11/22 1818 03/12/22 1211   03/12/22 0000  ceFAZolin (ANCEF) IVPB 2g/100 mL premix  Status:  Discontinued        2 g 200 mL/hr over 30 Minutes Intravenous Every 8 hours 03/11/22 2308 03/11/22 2315   03/11/22 0915  ceFAZolin (ANCEF) IVPB 2g/100 mL premix        2 g 200 mL/hr over 30 Minutes Intravenous On call to O.R. 03/11/22 0910 03/11/22 1642   03/11/22 0914  ceFAZolin (ANCEF) 2-4 GM/100ML-% IVPB       Note to Pharmacy: [name] O: cabinet override      03/11/22 0914 03/11/22 1313       Subjective: Asking for something to drink  Objective: Vitals:   03/22/22 0500 03/22/22 0730 03/22/22 1202 03/22/22 1523  BP:  113/72 (!) 125/59 (!) 119/56  Pulse:  73 80 80  Resp:  '18 18 18  '$ Temp:  98.7 F (37.1 C) 98.8 F (37.1 C) 98.1 F (36.7 C)  TempSrc:    Oral  SpO2:  100% 97% 95%  Weight: 85.6 kg     Height:        Intake/Output Summary (Last 24 hours) at 03/22/2022 1711 Last data filed at 03/22/2022 1525 Gross per 24 hour  Intake --  Output 1400 ml  Net -1400 ml   Filed Weights   03/19/22 0534 03/20/22 0500 03/22/22 0500  Weight: 87.8 kg 85.9 kg 85.6 kg    Examination:  General: No acute distress. Cardiovascular: RRR Lungs: unlabored Abdomen: Soft, nontender, nondistended Neurological: moving all extremities Extremities: No clubbing or cyanosis. No edema.   Data Reviewed: I have personally reviewed following labs and imaging  studies  CBC: Recent Labs  Lab 03/19/22 0326 03/20/22 0353 03/21/22 0304 03/22/22 0305  WBC 19.3* 24.3* 16.5* 19.4*  NEUTROABS 16.5* 22.4* 14.3* 17.4*  HGB 10.9* 10.8* 10.6* 10.3*  HCT 31.2* 32.1* 31.8* 30.7*  MCV 99.0 96.7 99.1 99.4  PLT 276 301 319 683    Basic Metabolic Panel: Recent Labs  Lab 03/17/22 1737 03/19/22 0326 03/20/22 0353 03/21/22 0304 03/21/22 2010 03/22/22 0305  NA  --  135 135 136 134* 135  K  --  3.7 3.9 3.6 3.6 3.5  CL  --  98 95* 98 95* 98  CO2  --  '28 30 29 27 29  '$ GLUCOSE  --  120* 135* 135* 205* 143*  BUN  --  24* 27* 30* 29* 29*  CREATININE  --  0.74 0.84 0.93 0.98 0.91  CALCIUM  --  8.4* 8.6* 8.4* 8.3* 8.2*  MG 1.9 1.8 1.9 2.0  --  2.2  PHOS 2.2* 4.0 4.2 4.5  --  3.8    GFR: Estimated Creatinine Clearance: 73.4 mL/min (by C-G formula based on SCr of 0.91 mg/dL).  Liver Function Tests: Recent Labs  Lab 03/19/22 0326 03/20/22 0353 03/21/22  0304  AST 13* 13* 14*  ALT '13 16 16  '$ ALKPHOS 45 44 45  BILITOT 0.7 1.2 1.1  PROT 5.9* 6.2* 6.2*  ALBUMIN 2.9* 2.8* 3.0*    CBG: Recent Labs  Lab 03/22/22 0036 03/22/22 0411 03/22/22 0730 03/22/22 1202 03/22/22 1525  GLUCAP 202* 138* 101* 260* 222*     No results found for this or any previous visit (from the past 240 hour(s)).        Radiology Studies: No results found.      Scheduled Meds:  azelastine  1 spray Each Nare Q1500   carvedilol  3.125 mg Oral BID WC   Chlorhexidine Gluconate Cloth  6 each Topical Daily   docusate  100 mg Oral BID   enoxaparin (LOVENOX) injection  40 mg Subcutaneous Q24H   feeding supplement (NEPRO CARB STEADY)  237 mL Oral TID BM   fluticasone  1 spray Each Nare Daily   furosemide  40 mg Oral BID   gabapentin  100 mg Oral QHS   insulin aspart  0-20 Units Subcutaneous TID WC   insulin aspart  0-5 Units Subcutaneous QHS   insulin aspart  3 Units Subcutaneous Q4H   insulin glargine-yfgn  10 Units Subcutaneous QHS   levothyroxine  88 mcg  Oral Q0600   mouth rinse  15 mL Mouth Rinse 4 times per day   pantoprazole sodium  40 mg Oral QHS   potassium chloride  20 mEq Oral Daily   rOPINIRole  0.5 mg Oral TID   rosuvastatin  20 mg Oral Daily   senna  1 tablet Oral BID   sodium chloride  2 spray Each Nare BID   sodium chloride flush  3 mL Intravenous Q12H   traZODone  50 mg Oral QHS   Continuous Infusions:  sodium chloride     methocarbamol (ROBAXIN) IV       LOS: 11 days    Time spent: over 30 min    Fayrene Helper, MD [facility]   To contact the attending provider between 7A-7P or the covering provider during after hours 7P-7A, please log into the web site [email or web address] and access using universal [place] password for that web site. If you do not have the password, please call the hospital operator.  03/22/2022, 5:11 PM

## 2022-03-23 DIAGNOSIS — M4712 Other spondylosis with myelopathy, cervical region: Secondary | ICD-10-CM | POA: Diagnosis not present

## 2022-03-23 LAB — CBC WITH DIFFERENTIAL/PLATELET
Abs Immature Granulocytes: 0.2 10*3/uL — ABNORMAL HIGH (ref 0.00–0.07)
Basophils Absolute: 0 10*3/uL (ref 0.0–0.1)
Basophils Relative: 0 %
Eosinophils Absolute: 0.1 10*3/uL (ref 0.0–0.5)
Eosinophils Relative: 0 %
HCT: 29.3 % — ABNORMAL LOW (ref 39.0–52.0)
Hemoglobin: 9.7 g/dL — ABNORMAL LOW (ref 13.0–17.0)
Immature Granulocytes: 1 %
Lymphocytes Relative: 5 %
Lymphs Abs: 0.9 10*3/uL (ref 0.7–4.0)
MCH: 33.2 pg (ref 26.0–34.0)
MCHC: 33.1 g/dL (ref 30.0–36.0)
MCV: 100.3 fL — ABNORMAL HIGH (ref 80.0–100.0)
Monocytes Absolute: 1.5 10*3/uL — ABNORMAL HIGH (ref 0.1–1.0)
Monocytes Relative: 8 %
Neutro Abs: 14.7 10*3/uL — ABNORMAL HIGH (ref 1.7–7.7)
Neutrophils Relative %: 86 %
Platelets: 301 10*3/uL (ref 150–400)
RBC: 2.92 MIL/uL — ABNORMAL LOW (ref 4.22–5.81)
RDW: 16.3 % — ABNORMAL HIGH (ref 11.5–15.5)
WBC: 17.3 10*3/uL — ABNORMAL HIGH (ref 4.0–10.5)
nRBC: 0 % (ref 0.0–0.2)

## 2022-03-23 LAB — COMPREHENSIVE METABOLIC PANEL
ALT: 23 U/L (ref 0–44)
AST: 18 U/L (ref 15–41)
Albumin: 2.7 g/dL — ABNORMAL LOW (ref 3.5–5.0)
Alkaline Phosphatase: 51 U/L (ref 38–126)
Anion gap: 9 (ref 5–15)
BUN: 27 mg/dL — ABNORMAL HIGH (ref 8–23)
CO2: 29 mmol/L (ref 22–32)
Calcium: 7.9 mg/dL — ABNORMAL LOW (ref 8.9–10.3)
Chloride: 95 mmol/L — ABNORMAL LOW (ref 98–111)
Creatinine, Ser: 0.82 mg/dL (ref 0.61–1.24)
GFR, Estimated: >60 mL/min (ref >60)
Glucose, Bld: 142 mg/dL — ABNORMAL HIGH (ref 70–99)
Potassium: 3.3 mmol/L — ABNORMAL LOW (ref 3.5–5.1)
Sodium: 133 mmol/L — ABNORMAL LOW (ref 135–145)
Total Bilirubin: 0.9 mg/dL (ref 0.3–1.2)
Total Protein: 5.6 g/dL — ABNORMAL LOW (ref 6.5–8.1)

## 2022-03-23 LAB — GLUCOSE, CAPILLARY
Glucose-Capillary: 208 mg/dL — ABNORMAL HIGH (ref 70–99)
Glucose-Capillary: 184 mg/dL — ABNORMAL HIGH (ref 70–99)
Glucose-Capillary: 167 mg/dL — ABNORMAL HIGH (ref 70–99)

## 2022-03-23 LAB — MAGNESIUM: Magnesium: 1.9 mg/dL (ref 1.7–2.4)

## 2022-03-23 LAB — PHOSPHORUS: Phosphorus: 2.9 mg/dL (ref 2.5–4.6)

## 2022-03-23 MED ORDER — ACETAMINOPHEN 325 MG PO TABS: 650.0000 mg | ORAL_TABLET | ORAL | Status: AC | PRN

## 2022-03-23 MED ORDER — TRAZODONE HCL 50 MG PO TABS
50.0000 mg | ORAL_TABLET | Freq: Every day | ORAL | Status: AC
Start: 2022-03-23 — End: ?

## 2022-03-23 MED ORDER — POTASSIUM CHLORIDE CRYS ER 20 MEQ PO TBCR
40.0000 meq | EXTENDED_RELEASE_TABLET | Freq: Once | ORAL | Status: AC
Start: 2022-03-23 — End: 2022-03-23
  Administered 2022-03-23: 40 meq via ORAL
  Filled 2022-03-23: qty 2

## 2022-03-23 MED ORDER — ENOXAPARIN SODIUM 40 MG/0.4ML IJ SOSY: 40.0000 mg | PREFILLED_SYRINGE | INTRAMUSCULAR | Status: AC

## 2022-03-23 MED ORDER — CARVEDILOL 3.125 MG PO TABS: 3.1250 mg | ORAL_TABLET | Freq: Two times a day (BID) | ORAL | Status: AC

## 2022-03-23 MED ORDER — METHOCARBAMOL 500 MG PO TABS: 500.0000 mg | ORAL_TABLET | Freq: Four times a day (QID) | ORAL | Status: DC | PRN

## 2022-03-23 MED ORDER — NEPRO/CARBSTEADY PO LIQD: 237.0000 mL | Freq: Three times a day (TID) | ORAL | 0 refills | Status: AC

## 2022-03-23 NOTE — Progress Notes (Signed)
Patient repeatedly calling for help and every time I go into his room, he c/o not being able to sleep or he feels "uneasy." Pt appears anxious.  According to staff, [a few nights ago] he recently had moments of calling out for help and stated "I need a tranquilizer."  Current actions taken by Loma Sousa, RN:  -Distraction -Reassurance -Repositioning -Pain management -MD notified

## 2022-03-23 NOTE — TOC Transition Note (Signed)
Transition of Care [facility]) - CM/SW Discharge Note   Patient Details  Name: [name] MRN: [id number] Date of Birth: May 24, 1946  [facility]) CM/SW Contact:  Milas Gain, [place] Phone Number: 03/23/2022, 11:36 AM   Clinical Narrative:     Patient will DC to: [facility]   Anticipated DC date: 03/23/2022  Family notified: [name]  Transport by: Corey Harold  ?  Per MD patient ready for DC to [facility] . RN, patient, patient's family, and facility notified of DC. Discharge Summary sent to facility. RN given number for report tele# [phone] RM#[id number] Bed 1. DC packet on chart. Ambulance transport requested for patient.  CSW signing off.   Final next level of care: Skilled Nursing Facility Barriers to Discharge: No Barriers Identified   Patient Goals and CMS Choice Patient states their goals for this hospitalization and ongoing recovery are:: SNF CMS Medicare.gov Compare Post Acute Care list provided to:: Patient Choice offered to / list presented to : Patient (patient and patients spouse)  Discharge Placement              Patient chooses bed at:  [facility]) Patient to be transferred to facility by: [place] Name of family member notified: [name] Patient and family notified of of transfer: 03/23/22  Discharge Plan and Services     Post Acute Care Choice: [place]                               Social Determinants of Health (SDOH) Interventions     Readmission Risk Interventions     No data to display

## 2022-03-23 NOTE — TOC Progression Note (Signed)
Transition of Care [facility]) - Progression Note    Patient Details  Name: [name] MRN: [id number] Date of Birth: 1946/09/02  Transition of Care [facility]) CM/SW [place] Phone Number: 03/23/2022, 11:34 AM  Clinical Narrative:     CSW spoke with Jackelyn Poling at [facility] who confirmed patient can dc over today if medically ready. CSW informed MD. Patients insurance authorization has been approved. CSW will continue to follow.  Expected Discharge Plan: [place] Barriers to Discharge: No Barriers Identified  Expected Discharge Plan and Services Expected Discharge Plan: [place] Choice: [place]   Expected Discharge Date: 03/23/22                                     Social Determinants of Health (SDOH) Interventions    Readmission Risk Interventions     No data to display

## 2022-03-23 NOTE — Progress Notes (Signed)
Patient complaining about dizziness & "falling down" in the bed. Moved patient up in the bed and he feels like the room is "spinning" and is gripping the side rails.   VS taken and will page MD.    03/23/22 0347  Vitals  Temp 97.7 F (36.5 C)  Temp Source Oral  BP (!) 117/50  MAP (mmHg) 65  BP Location Right Arm  BP Method Automatic  Patient Position (if appropriate) Lying  Pulse Rate 71  Pulse Rate Source Monitor  ECG Heart Rate 76  Resp 20  MEWS COLOR  MEWS Score Color Green  Oxygen Therapy  SpO2 96 %  O2 Device Room Air  MEWS Score  MEWS Temp 0  MEWS Systolic 0  MEWS Pulse 0  MEWS RR 0  MEWS LOC 0  MEWS Score 0

## 2022-03-23 NOTE — Discharge Summary (Signed)
Physician Discharge Summary  Patient ID: [name] MRN: [id number] DOB/AGE: Feb 09, 1946 76 y.o.  Admit date: 03/11/2022 Discharge date: 03/23/2022  Admission Diagnoses:  Cervical spondylosis with myelopathy  Discharge Diagnoses:  Same Principal Problem:   Cervical spondylosis with myelopathy Active Problems:   Hypertension   Type 2 diabetes mellitus ([place])   Hyperlipidemia   Dysphagia   Hypothyroidism   Protein-calorie malnutrition, severe   Acute respiratory failure with hypoxia (HCC)   Leukocytosis   Insomnia   Discharged Condition: Stable  Hospital Course:  [name] is a 76 y.o. male who initially underwent elective C5 corpectomy for decompression of severe cervical spinal stenosis with myelopathy.  He was noted to be weak postoperatively and required subsequent reoperation for evacuation of hematoma and further decompression of some residual osseous stenosis.  He was monitored in the general neuroscience floor where he was noted to be neurologically stable with primarily unchanged left deltoid weakness.  His dysphagia slowly improved and he was tolerating dysphagia 3 diet with nectar thick liquids.  He was seen by physical and Occupational Therapy and felt to require skilled nursing facility placement prior to going home.  He was therefore discharged in stable condition to skilled nursing facility.  Treatments: Surgery - ACCF C5 Hematoma evacuation  Discharge Exam: Blood pressure (!) 152/67, pulse 75, temperature 98 F (36.7 C), resp. rate 18, height 6' (1.829 m), weight 85.6 kg, SpO2 100 %. Awake, alert, oriented Speech fluent, appropriate CN grossly intact 5/5 BUE/BLE x left deltoid 3-4/5 Wound c/d/i  Disposition: Discharge disposition: 03-Skilled Nursing Facility       Discharge Instructions     Call MD for:  redness, tenderness, or signs of infection (pain, swelling, redness, odor or green/yellow discharge around incision site)   Complete by: As  directed    Call MD for:  temperature >100.4   Complete by: As directed    Diet - low sodium heart healthy   Complete by: As directed    Dysphagia 3 nectar thick   Discharge instructions   Complete by: As directed    Walk at home as much as possible, at least 4 times / day   Incentive spirometry RT   Complete by: As directed    Incentive spirometry RT   Complete by: As directed    Increase activity slowly   Complete by: As directed    Leave dressing on - Keep it clean, dry, and intact until clinic visit   Complete by: As directed    Lifting restrictions   Complete by: As directed    No lifting > 10 lbs   May shower / Bathe   Complete by: As directed    48 hours after surgery   May walk up steps   Complete by: As directed    Other Restrictions   Complete by: As directed    No bending/twisting at waist      Allergies as of 03/23/2022       Reactions   Codeine Nausea And Vomiting   Tramadol Nausea Only        Medication List     STOP taking these medications    aspirin EC 325 MG tablet   clopidogrel 75 MG tablet Commonly known as: PLAVIX       TAKE these medications    acetaminophen 325 MG tablet Commonly known as: TYLENOL Take 2 tablets (650 mg total) by mouth every 4 (four) hours as needed for mild pain ((score 1 to 3)  or temp > 100.5).   b complex vitamins tablet Take 1 tablet by mouth daily.   carvedilol 3.125 MG tablet Commonly known as: COREG Take 1 tablet (3.125 mg total) by mouth 2 (two) times daily with a meal.   Cyanocobalamin 1500 MCG Tbdp Take 1,500 mcg by mouth daily.   enoxaparin 40 MG/0.4ML injection Commonly known as: LOVENOX Inject 0.4 mLs (40 mg total) into the skin daily.   feeding supplement (NEPRO CARB STEADY) Liqd Take 237 mLs by mouth 3 (three) times daily between meals.   FreeStyle Libre 14 Day Reader [employer] 14 Day Sensor Misc APPLY ONE SENSOR ONTO SKIN EVERY 14 DAYS   furosemide 40 MG  tablet Commonly known as: LASIX TAKE ONE TABLET ('40MG'$  TOTAL) BY MOUTH TWO TIMES DAILY.   gabapentin 100 MG capsule Commonly known as: NEURONTIN Take 100-200 mg by mouth at bedtime.   glimepiride 4 MG tablet Commonly known as: AMARYL TAKE ONE TABLET ('4MG'$  TOTAL) BY MOUTH DAILY WITH BREAKFAST   levothyroxine 88 MCG tablet Commonly known as: SYNTHROID Take 1 tablet (88 mcg total) by mouth daily.   magnesium 30 MG tablet Take 30 mg by mouth daily.   methocarbamol 500 MG tablet Commonly known as: ROBAXIN Take 1 tablet (500 mg total) by mouth every 6 (six) hours as needed for muscle spasms.   multivitamin with minerals Tabs tablet Take 1 tablet by mouth daily.   nitroGLYCERIN 0.4 MG SL tablet Commonly known as: Nitrostat Place 1 tablet (0.4 mg total) under the tongue every 5 (five) minutes as needed for chest pain.   pantoprazole 40 MG tablet Commonly known as: PROTONIX TAKE ONE TABLET BY MOUTH ONCE DAILY   potassium chloride 10 MEQ tablet Commonly known as: KLOR-CON TAKE ONE TABLET (10MEQ TOTAL) BY MOUTH DAILY What changed: See the new instructions.   rOPINIRole 0.5 MG tablet Commonly known as: REQUIP Take 0.5 mg by mouth 3 (three) times daily.   rosuvastatin 20 MG tablet Commonly known as: CRESTOR TAKE ONE TABLET ('20MG'$  TOTAL) BY MOUTH DAILY What changed: See the new instructions.   traZODone 50 MG tablet Commonly known as: DESYREL Take 1 tablet (50 mg total) by mouth at bedtime.   UNABLE TO FIND Home health supplies for CPAP   UNABLE TO FIND [place] sock aid to help with support hose. Size XL.  Dx:I73.9, G25.81, R60.9   Vitamin D3 125 MCG (5000 UT) Tabs Take 5,000 Units by mouth daily at 6 (six) AM.               Discharge Care Instructions  (From admission, onward)           Start     Ordered   03/23/22 0000  Leave dressing on - Keep it clean, dry, and intact until clinic visit        03/23/22 1131            Follow-up Information      Kristeen Miss, MD. Schedule an appointment as soon as possible for a visit.   Specialty: Neurosurgery Contact information: [address] [place] [address] [phone]                 Signed: Jairo Ben 03/23/2022, 11:32 AM

## 2022-03-23 NOTE — Progress Notes (Signed)
  NEUROSURGERY PROGRESS NOTE   Appears to have been confused overnight/early this am. More appropriate this am. No new c/o.  EXAM:  BP (!) 152/67 (BP Location: Right Arm)   Pulse 75   Temp 98 F (36.7 C)   Resp 18   Ht 6' (1.829 m)   Wt 85.6 kg   SpO2 100%   BMI 25.59 kg/m   Awake, alert, oriented  Speech fluent, appropriate  CN grossly intact  5/5 RUE/BLE 4/5 left delt, good strength bicep/tricep/grip Wound c/d/i  IMPRESSION:  76 y.o. male s/p C5 corpectomy and subsequent hematoma evacuation, recovering as expected - dysphagia appears to be slowly improving  PLAN: - Appreciate medicine assistance - Cont supportive care - Plan on SNF d/c when bed available   Consuella Lose, MD [facility]

## 2022-03-23 NOTE — Progress Notes (Signed)
Consult NOTE    [name]  DGU:[id number] DOB: [birth year] DOA: 03/11/2022 PCP: Christopher Rival, FNP  No chief complaint on file.   Brief Narrative:  76 year old male who with previous severe C3-C4 stenosis with prior decompression years prior with worsening left arm weakness with MRI demonstrating left sided cord compression secondary to ossification of the posterior longitudinal ligament creating significant cord compression on left side.  He was admitted to [facility] on 6/22 for C5 corpectomy and ACCF found to have increased weakness postoperatively, taken back to OR for evacuation of epidural hematoma and revision of anterior decompression and fixation from C4-C6.  Hospitalization complicated by ongoing dysphagia despite steroids.  Speech has been following and remains NPO except ice chips.  On 6/26, he has had increasing hypoxia with concerns for aspiration.  PCCM consulted for further recommendations.  Significant events 6/22 admitted NSGY, OR C5 corpectomy/ ACCF> takeback to OR for epidural hematoma evac, decompression and C4-C6 fixation. 6/28 SLP reevaluated swallow with NGT in place and observed improvement in swallow seen but continued to recommend NGT as primary means of nutrition support  6/29 [facility] consulting for medical issues     Assessment & Plan:   Principal Problem:   Cervical spondylosis with myelopathy Active Problems:   Acute respiratory failure with hypoxia (HCC)   Dysphagia   Leukocytosis   Hypertension   Hyperlipidemia   Type 2 diabetes mellitus (HCC)   Hypothyroidism   Protein-calorie malnutrition, severe   Insomnia   Assessment and Plan: * Cervical spondylosis with myelopathy S/p C5 cervical corpectomy with undercutting of C4 and decompression of ossification of the posterior longitudinal ligament an the spinal canal from C3-5.  Reconstruction with titanium spacer and autograft arthrodesis C4 to C6 anterior plate fixation from Z5-G3 on 8/75 Complicated  by hematoma with increased weakness after surgical corpectomy C5 Increased weakness to L arm today, repeat MRI C spine per nsgy (MRI motion degraded, preverebral collection which extends into the L neck soft tissues (at least moderate narrowing of aerodigestive tract).  Prior ACDF at C3-6 levels and prior C5 corpectomy.  ACDF hardware spans C4-C6 levels, [name] corpectomy device is present at C5.  C4-C5 there is 8 mm T2 hypointense focus within L aspect of spinal canal. Up to moderate spinal canal stenosis at remaining levels.  C spine Ct this AM with revision of C4-5 corpectomy and fusion, smaller residual bone fragment in the L C4 canal.  Interval stable C5-6 corpectomy and fusion appearance.  Underlying C2-3 and C3-4 ankylosis.  Chronic C6-7 and C7-T1 degeneration.  Postoperative prevertebral fluid collection, likely seroma.    Per nsg - discharging today, follow up per NSGY   Acute respiratory failure with hypoxia ([facility]) Occurred post op  Had hematoma with increased weakness after surgical corpectomy C5 6/22 underwent evacuation of surgical hematoma and revision of anterior decompression fixation from C5 corpectomy with fixation from C4-6 PCCM consulted 6/26 with concern for worsening hypoxia and concern for aspiration Currently improved on RA, decadron per neurosurgery    Leukocytosis Related to steroids Afebrile Follow   Dysphagia Nocturnal feeds Follow PO intake NG d/c'd  Hyperlipidemia crestor  Hypertension Lasix, coreg Follow BP  Type 2 diabetes mellitus (HCC) Basal/bolus regimen ordered Will hold glimeperide while inpatient Watch while on steroids, nsgy d/cing today  Hypothyroidism synthroid  Protein-calorie malnutrition, severe Noted, RD  Insomnia trazodone     DVT prophylaxis: lovenox Code Status: full Family Communication: none Disposition:   Per nsgy   Consultants:  [facility] PCCM  Procedures:  Cervical corpectomy of C5 with undercutting of C4 and  decompression of ossification of the posterior longitudinal ligament and the spinal canal from C3-C5.  Reconstruction with titanium spacer and autograft arthrodesis see 4 to see 6 anterior plate fixation from A2-Q3     Evacuation of surgical hematoma and revision of anterior decompression fixation from C5 corpectomy with fixation from C4-C6  Antimicrobials:  Anti-infectives (From admission, onward)    Start     Dose/Rate Route Frequency Ordered Stop   03/12/22 0400  ceFAZolin (ANCEF) IVPB 2g/100 mL premix        2 g 200 mL/hr over 30 Minutes Intravenous Every 8 hours 03/11/22 1818 03/12/22 1211   03/12/22 0000  ceFAZolin (ANCEF) IVPB 2g/100 mL premix  Status:  Discontinued        2 g 200 mL/hr over 30 Minutes Intravenous Every 8 hours 03/11/22 2308 03/11/22 2315   03/11/22 0915  ceFAZolin (ANCEF) IVPB 2g/100 mL premix        2 g 200 mL/hr over 30 Minutes Intravenous On call to O.R. 03/11/22 0910 03/11/22 1642   03/11/22 0914  ceFAZolin (ANCEF) 2-4 GM/100ML-% IVPB       Note to Pharmacy: [name] O: cabinet override      03/11/22 0914 03/11/22 1313       Subjective: C/o some back soreness Otherwise doing well  Objective: Vitals:   03/22/22 2035 03/22/22 2313 03/23/22 0347 03/23/22 0749  BP: (!) 109/52 105/75 (!) 117/50 (!) 152/67  Pulse: 71 71 71 75  Resp: '20 20 20 18  '$ Temp: 98.2 F (36.8 C) 98.4 F (36.9 C) 97.7 F (36.5 C) 98 F (36.7 C)  TempSrc: Oral Oral Oral   SpO2: 98% 97% 96% 100%  Weight:      Height:        Intake/Output Summary (Last 24 hours) at 03/23/2022 1327 Last data filed at 03/23/2022 0927 Gross per 24 hour  Intake 2862.92 ml  Output 900 ml  Net 1962.92 ml   Filed Weights   03/19/22 0534 03/20/22 0500 03/22/22 0500  Weight: 87.8 kg 85.9 kg 85.6 kg    Examination:  General: No acute distress. Dressing to neck  Lungs: unlabored Neurological: Alert and oriented 3. Moves all extremities 4. Cranial nerves II through XII grossly  intact. Extremities: No clubbing or cyanosis. No edema  Data Reviewed: I have personally reviewed following labs and imaging studies  CBC: Recent Labs  Lab 03/19/22 0326 03/20/22 0353 03/21/22 0304 03/22/22 0305 03/23/22 0030  WBC 19.3* 24.3* 16.5* 19.4* 17.3*  NEUTROABS 16.5* 22.4* 14.3* 17.4* 14.7*  HGB 10.9* 10.8* 10.6* 10.3* 9.7*  HCT 31.2* 32.1* 31.8* 30.7* 29.3*  MCV 99.0 96.7 99.1 99.4 100.3*  PLT 276 301 319 324 335    Basic Metabolic Panel: Recent Labs  Lab 03/19/22 0326 03/20/22 0353 03/21/22 0304 03/21/22 2010 03/22/22 0305 03/23/22 0030  NA 135 135 136 134* 135 133*  K 3.7 3.9 3.6 3.6 3.5 3.3*  CL 98 95* 98 95* 98 95*  CO2 '28 30 29 27 29 29  '$ GLUCOSE 120* 135* 135* 205* 143* 142*  BUN 24* 27* 30* 29* 29* 27*  CREATININE 0.74 0.84 0.93 0.98 0.91 0.82  CALCIUM 8.4* 8.6* 8.4* 8.3* 8.2* 7.9*  MG 1.8 1.9 2.0  --  2.2 1.9  PHOS 4.0 4.2 4.5  --  3.8 2.9    GFR: Estimated Creatinine Clearance: 81.5 mL/min (by C-G formula based on SCr of 0.82  mg/dL).  Liver Function Tests: Recent Labs  Lab 03/19/22 0326 03/20/22 0353 03/21/22 0304 03/23/22 0030  AST 13* 13* 14* 18  ALT '13 16 16 23  '$ ALKPHOS 45 44 45 51  BILITOT 0.7 1.2 1.1 0.9  PROT 5.9* 6.2* 6.2* 5.6*  ALBUMIN 2.9* 2.8* 3.0* 2.7*    CBG: Recent Labs  Lab 03/22/22 2014 03/22/22 2316 03/23/22 0315 03/23/22 0748 03/23/22 1206  GLUCAP 115* 102* 208* 184* 167*     No results found for this or any previous visit (from the past 240 hour(s)).        Radiology Studies: No results found.      Scheduled Meds:  azelastine  1 spray Each Nare Q1500   carvedilol  3.125 mg Oral BID WC   Chlorhexidine Gluconate Cloth  6 each Topical Daily   docusate  100 mg Oral BID   enoxaparin (LOVENOX) injection  40 mg Subcutaneous Q24H   feeding supplement (NEPRO CARB STEADY)  237 mL Oral TID BM   fluticasone  1 spray Each Nare Daily   furosemide  40 mg Oral BID   gabapentin  100 mg Oral QHS    insulin aspart  0-20 Units Subcutaneous TID WC   insulin aspart  0-5 Units Subcutaneous QHS   insulin aspart  3 Units Subcutaneous Q4H   insulin glargine-yfgn  10 Units Subcutaneous QHS   levothyroxine  88 mcg Oral Q0600   mouth rinse  15 mL Mouth Rinse 4 times per day   pantoprazole sodium  40 mg Oral QHS   potassium chloride  20 mEq Oral Daily   rOPINIRole  0.5 mg Oral TID   rosuvastatin  20 mg Oral Daily   senna  1 tablet Oral BID   sodium chloride  2 spray Each Nare BID   sodium chloride flush  3 mL Intravenous Q12H   traZODone  50 mg Oral QHS   Continuous Infusions:  sodium chloride     methocarbamol (ROBAXIN) IV       LOS: 12 days    Time spent: over 30 min    Fayrene Helper, MD [facility]   To contact the attending provider between 7A-7P or the covering provider during after hours 7P-7A, please log into the web site [email or web address] and access using universal [place] password for that web site. If you do not have the password, please call the hospital operator.  03/23/2022, 1:27 PM

## 2022-03-24 ENCOUNTER — Ambulatory Visit (HOSPITAL_COMMUNITY): Payer: HMO | Admitting: Physical Therapy

## 2022-03-26 ENCOUNTER — Other Ambulatory Visit: Payer: Self-pay

## 2022-03-26 ENCOUNTER — Emergency Department (HOSPITAL_COMMUNITY): Payer: HMO

## 2022-03-26 ENCOUNTER — Inpatient Hospital Stay (HOSPITAL_COMMUNITY)
Admission: EM | Admit: 2022-03-26 | Discharge: 2022-03-30 | DRG: 871 | Disposition: A | Discharge status: Skilled Nursing Facility | Payer: HMO | Source: Skilled Nursing Facility | Attending: Internal Medicine | Admitting: Internal Medicine

## 2022-03-26 ENCOUNTER — Ambulatory Visit (HOSPITAL_COMMUNITY): Payer: HMO | Admitting: Physical Therapy

## 2022-03-26 ENCOUNTER — Encounter (HOSPITAL_COMMUNITY): Payer: Self-pay

## 2022-03-26 DIAGNOSIS — R0902 Hypoxemia: Secondary | ICD-10-CM | POA: Diagnosis present

## 2022-03-26 DIAGNOSIS — E039 Hypothyroidism, unspecified: Secondary | ICD-10-CM | POA: Diagnosis present

## 2022-03-26 DIAGNOSIS — G4733 Obstructive sleep apnea (adult) (pediatric): Secondary | ICD-10-CM | POA: Diagnosis present

## 2022-03-26 DIAGNOSIS — N390 Urinary tract infection, site not specified: Secondary | ICD-10-CM

## 2022-03-26 DIAGNOSIS — Z87891 Personal history of nicotine dependence: Secondary | ICD-10-CM

## 2022-03-26 DIAGNOSIS — Z833 Family history of diabetes mellitus: Secondary | ICD-10-CM

## 2022-03-26 DIAGNOSIS — K219 Gastro-esophageal reflux disease without esophagitis: Secondary | ICD-10-CM | POA: Diagnosis present

## 2022-03-26 DIAGNOSIS — D72829 Elevated white blood cell count, unspecified: Secondary | ICD-10-CM

## 2022-03-26 DIAGNOSIS — I110 Hypertensive heart disease with heart failure: Secondary | ICD-10-CM | POA: Diagnosis present

## 2022-03-26 DIAGNOSIS — R627 Adult failure to thrive: Secondary | ICD-10-CM | POA: Diagnosis present

## 2022-03-26 DIAGNOSIS — E119 Type 2 diabetes mellitus without complications: Secondary | ICD-10-CM

## 2022-03-26 DIAGNOSIS — Z6823 Body mass index (BMI) 23.0-23.9, adult: Secondary | ICD-10-CM

## 2022-03-26 DIAGNOSIS — E1151 Type 2 diabetes mellitus with diabetic peripheral angiopathy without gangrene: Secondary | ICD-10-CM | POA: Diagnosis present

## 2022-03-26 DIAGNOSIS — E43 Unspecified severe protein-calorie malnutrition: Secondary | ICD-10-CM | POA: Diagnosis present

## 2022-03-26 DIAGNOSIS — Z885 Allergy status to narcotic agent status: Secondary | ICD-10-CM

## 2022-03-26 DIAGNOSIS — R9431 Abnormal electrocardiogram [ECG] [EKG]: Secondary | ICD-10-CM

## 2022-03-26 DIAGNOSIS — A419 Sepsis, unspecified organism: Principal | ICD-10-CM

## 2022-03-26 DIAGNOSIS — Z7984 Long term (current) use of oral hypoglycemic drugs: Secondary | ICD-10-CM

## 2022-03-26 DIAGNOSIS — A4159 Other Gram-negative sepsis: Secondary | ICD-10-CM | POA: Diagnosis not present

## 2022-03-26 DIAGNOSIS — I779 Disorder of arteries and arterioles, unspecified: Secondary | ICD-10-CM | POA: Diagnosis not present

## 2022-03-26 DIAGNOSIS — Z79899 Other long term (current) drug therapy: Secondary | ICD-10-CM

## 2022-03-26 DIAGNOSIS — I10 Essential (primary) hypertension: Secondary | ICD-10-CM | POA: Diagnosis present

## 2022-03-26 DIAGNOSIS — E1165 Type 2 diabetes mellitus with hyperglycemia: Secondary | ICD-10-CM | POA: Diagnosis present

## 2022-03-26 DIAGNOSIS — E860 Dehydration: Secondary | ICD-10-CM

## 2022-03-26 DIAGNOSIS — I959 Hypotension, unspecified: Secondary | ICD-10-CM | POA: Diagnosis present

## 2022-03-26 DIAGNOSIS — I2510 Atherosclerotic heart disease of native coronary artery without angina pectoris: Secondary | ICD-10-CM | POA: Diagnosis present

## 2022-03-26 DIAGNOSIS — R1310 Dysphagia, unspecified: Secondary | ICD-10-CM

## 2022-03-26 DIAGNOSIS — I429 Cardiomyopathy, unspecified: Secondary | ICD-10-CM | POA: Diagnosis present

## 2022-03-26 DIAGNOSIS — L89322 Pressure ulcer of left buttock, stage 2: Secondary | ICD-10-CM | POA: Diagnosis present

## 2022-03-26 DIAGNOSIS — J9601 Acute respiratory failure with hypoxia: Secondary | ICD-10-CM | POA: Diagnosis not present

## 2022-03-26 DIAGNOSIS — M4712 Other spondylosis with myelopathy, cervical region: Secondary | ICD-10-CM | POA: Diagnosis present

## 2022-03-26 DIAGNOSIS — Z8673 Personal history of transient ischemic attack (TIA), and cerebral infarction without residual deficits: Secondary | ICD-10-CM

## 2022-03-26 DIAGNOSIS — Z8249 Family history of ischemic heart disease and other diseases of the circulatory system: Secondary | ICD-10-CM

## 2022-03-26 DIAGNOSIS — E8809 Other disorders of plasma-protein metabolism, not elsewhere classified: Secondary | ICD-10-CM | POA: Diagnosis present

## 2022-03-26 DIAGNOSIS — Z79890 Hormone replacement therapy (postmenopausal): Secondary | ICD-10-CM

## 2022-03-26 DIAGNOSIS — Z981 Arthrodesis status: Secondary | ICD-10-CM

## 2022-03-26 DIAGNOSIS — L8990 Pressure ulcer of unspecified site, unspecified stage: Secondary | ICD-10-CM | POA: Insufficient documentation

## 2022-03-26 DIAGNOSIS — I5032 Chronic diastolic (congestive) heart failure: Secondary | ICD-10-CM

## 2022-03-26 DIAGNOSIS — E782 Mixed hyperlipidemia: Secondary | ICD-10-CM

## 2022-03-26 DIAGNOSIS — D539 Nutritional anemia, unspecified: Secondary | ICD-10-CM

## 2022-03-26 DIAGNOSIS — E871 Hypo-osmolality and hyponatremia: Secondary | ICD-10-CM | POA: Diagnosis present

## 2022-03-26 DIAGNOSIS — G2581 Restless legs syndrome: Secondary | ICD-10-CM | POA: Diagnosis present

## 2022-03-26 DIAGNOSIS — E785 Hyperlipidemia, unspecified: Secondary | ICD-10-CM | POA: Diagnosis present

## 2022-03-26 LAB — CBC WITH DIFFERENTIAL/PLATELET
Abs Immature Granulocytes: 0 10*3/uL (ref 0.00–0.07)
Basophils Absolute: 0 10*3/uL (ref 0.0–0.1)
Basophils Relative: 0 %
Eosinophils Absolute: 0 10*3/uL (ref 0.0–0.5)
Eosinophils Relative: 0 %
HCT: 32.9 % — ABNORMAL LOW (ref 39.0–52.0)
Hemoglobin: 10.9 g/dL — ABNORMAL LOW (ref 13.0–17.0)
Lymphocytes Relative: 2 %
Lymphs Abs: 0.5 10*3/uL — ABNORMAL LOW (ref 0.7–4.0)
MCH: 33.6 pg (ref 26.0–34.0)
MCHC: 33.1 g/dL (ref 30.0–36.0)
MCV: 101.5 fL — ABNORMAL HIGH (ref 80.0–100.0)
Monocytes Absolute: 4.5 10*3/uL — ABNORMAL HIGH (ref 0.1–1.0)
Monocytes Relative: 17 %
Neutro Abs: 21.5 10*3/uL — ABNORMAL HIGH (ref 1.7–7.7)
Neutrophils Relative %: 81 %
Platelets: 312 10*3/uL (ref 150–400)
RBC: 3.24 MIL/uL — ABNORMAL LOW (ref 4.22–5.81)
RDW: 16.8 % — ABNORMAL HIGH (ref 11.5–15.5)
WBC: 26.5 10*3/uL — ABNORMAL HIGH (ref 4.0–10.5)
nRBC: 0.1 % (ref 0.0–0.2)

## 2022-03-26 LAB — URINALYSIS, ROUTINE W REFLEX MICROSCOPIC
Bilirubin Urine: NEGATIVE
Glucose, UA: NEGATIVE mg/dL
Hgb urine dipstick: NEGATIVE
Ketones, ur: NEGATIVE mg/dL
Nitrite: NEGATIVE
Protein, ur: 100 mg/dL — AB
Specific Gravity, Urine: 1.026 (ref 1.005–1.030)
pH: 8 (ref 5.0–8.0)

## 2022-03-26 LAB — COMPREHENSIVE METABOLIC PANEL
ALT: 19 U/L (ref 0–44)
AST: 19 U/L (ref 15–41)
Albumin: 3.2 g/dL — ABNORMAL LOW (ref 3.5–5.0)
Alkaline Phosphatase: 64 U/L (ref 38–126)
Anion gap: 8 (ref 5–15)
BUN: 19 mg/dL (ref 8–23)
CO2: 26 mmol/L (ref 22–32)
Calcium: 8 mg/dL — ABNORMAL LOW (ref 8.9–10.3)
Chloride: 98 mmol/L (ref 98–111)
Creatinine, Ser: 0.8 mg/dL (ref 0.61–1.24)
GFR, Estimated: >60 mL/min (ref >60)
Glucose, Bld: 150 mg/dL — ABNORMAL HIGH (ref 70–99)
Potassium: 4.1 mmol/L (ref 3.5–5.1)
Sodium: 132 mmol/L — ABNORMAL LOW (ref 135–145)
Total Bilirubin: 1 mg/dL (ref 0.3–1.2)
Total Protein: 7.2 g/dL (ref 6.5–8.1)

## 2022-03-26 LAB — GLUCOSE, CAPILLARY: Glucose-Capillary: 127 mg/dL — ABNORMAL HIGH (ref 70–99)

## 2022-03-26 LAB — CBG MONITORING, ED: Glucose-Capillary: 147 mg/dL — ABNORMAL HIGH (ref 70–99)

## 2022-03-26 LAB — LACTIC ACID, PLASMA
Lactic Acid, Venous: 1.3 mmol/L (ref 0.5–1.9)
Lactic Acid, Venous: 0.9 mmol/L (ref 0.5–1.9)

## 2022-03-26 MED ORDER — ACETAMINOPHEN 650 MG RE SUPP
650.0000 mg | Freq: Once | RECTAL | Status: AC
  Administered 2022-03-26: 650 mg via RECTAL
  Filled 2022-03-26: qty 1

## 2022-03-26 MED ORDER — SODIUM CHLORIDE 0.9 % IV BOLUS
1000.0000 mL | Freq: Once | INTRAVENOUS | Status: AC
  Administered 2022-03-26: 1000 mL via INTRAVENOUS

## 2022-03-26 MED ORDER — GLUCERNA SHAKE PO LIQD
237.0000 mL | Freq: Three times a day (TID) | ORAL | Status: DC
  Administered 2022-03-26 – 2022-03-30 (×8): 237 mL via ORAL
  Filled 2022-03-26 (×9): qty 237

## 2022-03-26 MED ORDER — VANCOMYCIN HCL IN DEXTROSE 1-5 GM/200ML-% IV SOLN
1000.0000 mg | Freq: Once | INTRAVENOUS | Status: AC
Start: 2022-03-26 — End: 2022-03-26
  Administered 2022-03-26: 1000 mg via INTRAVENOUS
  Filled 2022-03-26: qty 200

## 2022-03-26 MED ORDER — ROPINIROLE HCL 0.25 MG PO TABS
0.5000 mg | ORAL_TABLET | Freq: Three times a day (TID) | ORAL | Status: DC
  Administered 2022-03-26 – 2022-03-30 (×11): 0.5 mg via ORAL
  Filled 2022-03-26 (×11): qty 2

## 2022-03-26 MED ORDER — ROSUVASTATIN CALCIUM 20 MG PO TABS
20.0000 mg | ORAL_TABLET | Freq: Every day | ORAL | Status: DC
  Administered 2022-03-27 – 2022-03-30 (×4): 20 mg via ORAL
  Filled 2022-03-26 (×4): qty 1

## 2022-03-26 MED ORDER — CYANOCOBALAMIN 1500 MCG PO TBDP: 1500.0000 ug | ORAL_TABLET | Freq: Every day | ORAL | Status: DC

## 2022-03-26 MED ORDER — SODIUM CHLORIDE 0.9 % IV SOLN
2.0000 g | Freq: Once | INTRAVENOUS | Status: AC
  Administered 2022-03-26: 2 g via INTRAVENOUS
  Filled 2022-03-26: qty 20

## 2022-03-26 MED ORDER — ACETAMINOPHEN 325 MG PO TABS
650.0000 mg | ORAL_TABLET | Freq: Four times a day (QID) | ORAL | Status: DC | PRN
  Administered 2022-03-27 – 2022-03-29 (×3): 650 mg via ORAL
  Filled 2022-03-26 (×3): qty 2

## 2022-03-26 MED ORDER — CEFTRIAXONE SODIUM 1 G IJ SOLR
1.0000 g | INTRAMUSCULAR | Status: DC
Start: 2022-03-27 — End: 2022-03-28
  Administered 2022-03-27 – 2022-03-28 (×2): 1 g via INTRAVENOUS
  Filled 2022-03-26 (×2): qty 10

## 2022-03-26 MED ORDER — CARVEDILOL 3.125 MG PO TABS
3.1250 mg | ORAL_TABLET | Freq: Two times a day (BID) | ORAL | Status: DC
  Administered 2022-03-27 – 2022-03-30 (×7): 3.125 mg via ORAL
  Filled 2022-03-26 (×7): qty 1

## 2022-03-26 MED ORDER — PANTOPRAZOLE SODIUM 40 MG PO TBEC
40.0000 mg | DELAYED_RELEASE_TABLET | Freq: Every day | ORAL | Status: DC
  Administered 2022-03-27 – 2022-03-30 (×4): 40 mg via ORAL
  Filled 2022-03-26 (×4): qty 1

## 2022-03-26 MED ORDER — NITROGLYCERIN 0.4 MG SL SUBL
0.4000 mg | SUBLINGUAL_TABLET | SUBLINGUAL | Status: DC | PRN
Start: 2022-03-26 — End: 2022-03-30

## 2022-03-26 MED ORDER — LEVOTHYROXINE SODIUM 88 MCG PO TABS
88.0000 ug | ORAL_TABLET | Freq: Every day | ORAL | Status: DC
  Administered 2022-03-27 – 2022-03-30 (×4): 88 ug via ORAL
  Filled 2022-03-26 (×5): qty 1

## 2022-03-26 MED ORDER — INSULIN ASPART 100 UNIT/ML IJ SOLN
0.0000 [IU] | Freq: Three times a day (TID) | INTRAMUSCULAR | Status: DC
  Administered 2022-03-27: 2 [IU] via SUBCUTANEOUS
  Administered 2022-03-27: 5 [IU] via SUBCUTANEOUS
  Administered 2022-03-27 – 2022-03-28 (×3): 3 [IU] via SUBCUTANEOUS
  Administered 2022-03-28: 2 [IU] via SUBCUTANEOUS
  Administered 2022-03-29 (×2): 3 [IU] via SUBCUTANEOUS
  Administered 2022-03-29: 2 [IU] via SUBCUTANEOUS
  Administered 2022-03-30: 3 [IU] via SUBCUTANEOUS
  Administered 2022-03-30: 11 [IU] via SUBCUTANEOUS

## 2022-03-26 MED ORDER — ENOXAPARIN SODIUM 40 MG/0.4ML IJ SOSY
40.0000 mg | PREFILLED_SYRINGE | INTRAMUSCULAR | Status: DC
  Administered 2022-03-26 – 2022-03-29 (×4): 40 mg via SUBCUTANEOUS
  Filled 2022-03-26 (×4): qty 0.4

## 2022-03-26 MED ORDER — ACETAMINOPHEN 325 MG PO TABS: 650.0000 mg | ORAL_TABLET | Freq: Once | ORAL | Status: DC

## 2022-03-26 NOTE — H&P (Signed)
History and Physical    Patient: [name] DOB: [birth year] DOA: 03/26/2022 DOS: the patient was seen and examined on 03/26/2022 PCP: Renee Rival, FNP  Patient coming from: SNF  Chief Complaint:  Chief Complaint  Patient presents with   Hypotension    MD from SNF states patient was 83% on 3L and  hypotensive, EMS reports 119/66 on their arrival to SNF   HPI: [name] is a 76 y.o. male with medical history significant of hypertension hyperlipidemia, chronic diastolic CHF, CAD, Q7RF, hypothyroidism, GERD, RLS, and severe cervical spondylosis with myelopathy s/p elective C5 corpectomy and subsequent evacuation of anterior cervical hematoma with removal and replacement of hardware (03/11/2022) who presents to the emergency department via EMS from a  nursing home due to a report of hypotension and hypoxia.  However, on arrival of EMS team, BP was 119/66.  At bedside, patient complained of generalized weakness that has been ongoing since his surgery, wife at bedside states that patient has not been able to ambulate with his walker since the surgery due to weakness.  Patient complained of burning sensation on urination that has been ongoing prior to being discharged from [facility].  Patient also had dysphagia after the surgery but is now tolerating dysphagia 3 diet.  He denies nausea, vomiting, chest pain, abdominal pain.   Patient was admitted from 6/22 to 7/4 at [facility] due to cervical spondylosis with myelopathy which was repaired as described above.  ED Course:  In the emergency department, temperature was 100.29F, she was intermittently tachypneic and other vital signs were within normal range, O2 sats ranged within 95-97% on 3 LPM of oxygen.  Work-up in the ED showed leukocytosis and macrocytic anemia.  BMP was normal except for hyponatremia and hyperglycemia.  Albumin 3.2, lactic acid x2 was negative, urinalysis showed moderate leukocytes and few bacteria,  proteinuria and has a cloudy appearance. CT head without contrast showed no evidence for acute intracranial abnormality Chest x-ray showed cardiomegaly without acute abnormality of the lungs in AP portable projection Neurosurgeon (Dr. Ellene Route) was consulted due to patient's elevated WBC level, he does not think it was related to the surgery.  He does not think the patient needs to be transferred to [facility], however, he will consult on patient if admitted to [facility]. Empiric IV antibiotics (ceftriaxone and vancomycin) were given, Tylenol was given and IV hydration was provided.  Hospitalist was asked to admit patient for further evaluation and management.  Review of Systems: Review of systems as noted in the HPI. All other systems reviewed and are negative.   Past Medical History:  Diagnosis Date   Aftercare following surgery of the circulatory system, NEC 12/04/2013   Angina decubitus ([place]) 05/19/2016   Arrhythmia 05/20/2016   Arthritis    ARTHRITIS, RIGHT FOOT 06/26/2008   Qualifier: Diagnosis of  By: Aline Brochure MD, [name]     Cardiomyopathy- EF NL 01/2013, now 30-35% echo 03/14/2012   Carotid artery occlusion    left s/p CEA   Cellulitis of left foot 05/07/2018   CHF (congestive heart failure) ([place])    Critical lower limb ischemia ([place]) 02/28/2014   Critical limb ischemia    Diabetes mellitus    Elevated troponin 04/11/2016   GERD (gastroesophageal reflux disease)    History of stroke June 2013 03/14/2012   HOH (hard of hearing)    Hypertension    Hypothyroidism    Nonhealing skin ulcer ([place]) 10/28/2014   Obstructive sleep apnea  Orthostatic hypotension    Osteomyelitis (HCC)    left great toe   Pain in the chest 05/17/2016   Peripheral arterial disease ([place]), s/p PTA x 2 RLE    nonhealing ulcers bilaterally on each great toe   Pneumonia    PONV (postoperative nausea and vomiting)    Also hard to awake after anesthesia   Pulmonary nodule 05/17/2016   Restless leg syndrome     Shortness of breath    Stroke ([place]) 03/08/2012   Toe osteomyelitis, left (HCC)    Ulcer of great toe, left, with necrosis of bone ([place])    Wears dentures    Past Surgical History:  Procedure Laterality Date   AMPUTATION TOE Left 07/25/2018   Procedure: AMPUTATION TOE INTERPHALANGEAL HALLUX LEFT;  Surgeon: Evelina Bucy, DPM;  Location: [place];  Service: Podiatry;  Laterality: Left;   ANGIOPLASTY  02/28/14   diamond back orbital rotational atherectomy of Rt. tibial   ANTERIOR CERVICAL CORPECTOMY N/A 03/11/2022   Procedure: Cervical corpectomy Cervical Four with reconstruction using titanium spacer, anterior plate fixation Cervical Four to Cervical Six;  Surgeon: Kristeen Miss, MD;  Location: [place];  Service: Neurosurgery;  Laterality: N/A;  RM 19   BACK SURGERY     BONE BIOPSY Left 07/25/2018   Procedure: SUPERFICIAL BONE BIOPSY;  Surgeon: Evelina Bucy, DPM;  Location: [place];  Service: Podiatry;  Laterality: Left;   CARDIAC CATHETERIZATION N/A 05/19/2016   Procedure: Right/Left Heart Cath and Coronary Angiography;  Surgeon: Belva Crome, MD;  Location: [employer] CV [facility];  Service: Cardiovascular;  Laterality: N/A;   CERVICAL FUSION     ENDARTERECTOMY Left 11/29/2013   Procedure: ENDARTERECTOMY CAROTID;  Surgeon: Serafina Mitchell, MD;  Location: [place];  Service: Vascular;  Laterality: Left;   ESOPHAGOGASTRODUODENOSCOPY  02/2010   Dr. Gala Romney: patient presented with food impaction, schatzki ring with superimposed component of stricture with erosive reflux esophagitis, s/p disimpaction but dilation planned at later date    ESOPHAGOGASTRODUODENOSCOPY (EGD) WITH PROPOFOL N/A 08/13/2019   Dr. Gala Romney: Erosive reflux esophagitis with mild stricture and incidental Mallory-Weiss tear which precluded esophageal dilation.  Medium sized hiatal hernia.   ESOPHAGOGASTRODUODENOSCOPY (EGD) WITH PROPOFOL N/A 10/11/2019   Dr. Gala Romney: Esophageal stenosis status post dilation, moderate hiatal hernia   EVACUATION  OF CERVICAL HEMATOMA N/A 03/11/2022   Procedure: EVACUATION OF ANTERIOR CERVICAL HEMATOMA WITH REMOVAL AND REPLACEMENT OF HARDWARE;  Surgeon: Kristeen Miss, MD;  Location: [place];  Service: Neurosurgery;  Laterality: N/A;   EYE SURGERY     FLEXIBLE SIGMOIDOSCOPY N/A 08/13/2019   Procedure: FLEXIBLE SIGMOIDOSCOPY;  Surgeon: [name], MD;  [facility];  Service: Endoscopy;  Laterality: N/A;  colonoscopy aboerted due to formed stool and poor prep   FOOT SURGERY     KNEE ARTHROSCOPY WITH MEDIAL MENISECTOMY Left 05/06/2020   Procedure: KNEE ARTHROSCOPY WITH MEDIAL MENISCECTOMY AND LATERAL MENISCECTOMY;  Surgeon: [name], MD;  Location: [facility];  Service: Orthopedics;  Laterality: Left;   Lower ext duplex doppler  03/14/14   Rt ABI 1.2   LOWER EXTREMITY ANGIOGRAM Bilateral 02/18/2014   Procedure: LOWER EXTREMITY ANGIOGRAM;  Surgeon: [name], MD;  Location: [facility] CATH LAB;  Service: Cardiovascular;  Laterality: Bilateral;   LOWER EXTREMITY ANGIOGRAM N/A 10/31/2014   Procedure: LOWER EXTREMITY ANGIOGRAM;  Surgeon: [name], MD;  Location: [facility] CATH LAB;  Service: Cardiovascular;  Laterality: N/A;   MALONEY DILATION N/A 10/11/2019   Procedure: MALONEY DILATION;  Surgeon: [name], MD;  [facility];  Service: Endoscopy;  Laterality: N/A;   MULTIPLE TOOTH EXTRACTIONS     PV angiogram  02/18/2014   tibial vessel diseas bil.   SPINE SURGERY     tendon achillies lengthing and sesamoid      Social History:  reports that he quit smoking about 44 years ago. His smoking use included pipe. He has never used smokeless tobacco. He reports that he does not drink alcohol and does not use drugs.   Allergies  Allergen Reactions   Codeine Nausea And Vomiting   Tramadol Nausea Only    Family History  Problem Relation Age of Onset   Heart disease Mother    Hypertension Mother    Heart attack Mother    Hypertension Father    Diabetes Father    Diabetes Son     Heart disease Son    Hypertension Son    Colon cancer Neg Hx      Prior to Admission medications   Medication Sig Start Date End Date Taking? Authorizing Provider  acetaminophen (TYLENOL) 325 MG tablet Take 2 tablets (650 mg total) by mouth every 4 (four) hours as needed for mild pain ((score 1 to 3) or temp > 100.5). 03/23/22   Consuella Lose, MD  b complex vitamins tablet Take 1 tablet by mouth daily.    [provider]  carvedilol (COREG) 3.125 MG tablet Take 1 tablet (3.125 mg total) by mouth 2 (two) times daily with a meal. 03/23/22   Consuella Lose, MD  Cholecalciferol (VITAMIN D3) 5000 units TABS Take 5,000 Units by mouth daily at 6 (six) AM.    [provider]  Continuous Blood Gluc Receiver (FREESTYLE LIBRE 14 DAY READER) DEVI  12/07/19   [provider]  Continuous Blood Gluc Sensor (FREESTYLE LIBRE 14 DAY SENSOR) MISC APPLY ONE SENSOR ONTO SKIN EVERY 14 DAYS 12/21/21   Paseda, Dewaine Conger, FNP  Cyanocobalamin 1500 MCG TBDP Take 1,500 mcg by mouth daily.     [provider]  enoxaparin (LOVENOX) 40 MG/0.4ML injection Inject 0.4 mLs (40 mg total) into the skin daily. 03/23/22   Consuella Lose, MD  furosemide (LASIX) 40 MG tablet TAKE ONE TABLET ('40MG'$  TOTAL) BY MOUTH TWO TIMES DAILY. 01/18/22   [name], MD  gabapentin (NEURONTIN) 100 MG capsule Take 100-200 mg by mouth at bedtime. 03/02/22   [provider]  glimepiride (AMARYL) 4 MG tablet TAKE ONE TABLET ('4MG'$  TOTAL) BY MOUTH DAILY WITH BREAKFAST 10/19/21   Paseda, Dewaine Conger, FNP  levothyroxine (SYNTHROID) 88 MCG tablet Take 1 tablet (88 mcg total) by mouth daily. 01/27/21   Noreene Larsson, NP  magnesium 30 MG tablet Take 30 mg by mouth daily.    [provider]  methocarbamol (ROBAXIN) 500 MG tablet Take 1 tablet (500 mg total) by mouth every 6 (six) hours as needed for muscle spasms. 03/23/22   Consuella Lose, MD  Multiple Vitamin (MULTIVITAMIN WITH MINERALS) TABS  tablet Take 1 tablet by mouth daily.    [provider]  nitroGLYCERIN (NITROSTAT) 0.4 MG SL tablet Place 1 tablet (0.4 mg total) under the tongue every 5 (five) minutes as needed for chest pain. 03/02/22   [name], MD  Nutritional Supplements (FEEDING SUPPLEMENT, NEPRO CARB STEADY,) LIQD Take 237 mLs by mouth 3 (three) times daily between meals. 03/23/22   Consuella Lose, MD  pantoprazole (PROTONIX) 40 MG tablet TAKE ONE TABLET BY MOUTH ONCE DAILY  Patient taking differently: Take 40 mg by mouth daily. 10/15/21   Erenest Rasher, PA-C  potassium chloride (KLOR-CON) 10 MEQ tablet TAKE ONE TABLET (10MEQ TOTAL) BY MOUTH DAILY Patient taking differently: Take 10 mEq by mouth daily. 05/11/21   [name], MD  rOPINIRole (REQUIP) 0.5 MG tablet Take 0.5 mg by mouth 3 (three) times daily.    [provider]  rosuvastatin (CRESTOR) 20 MG tablet TAKE ONE TABLET ('20MG'$  TOTAL) BY MOUTH DAILY Patient taking differently: Take 20 mg by mouth daily. 10/19/21   Renee Rival, FNP  traZODone (DESYREL) 50 MG tablet Take 1 tablet (50 mg total) by mouth at bedtime. 03/23/22   Consuella Lose, MD  UNABLE TO FIND Home health supplies for CPAP 09/30/20   Perlie Mayo, NP  UNABLE TO FIND [place] sock aid to help with support hose. Size XL.  Dx:I73.9, G25.81, R60.9 01/14/21   Noreene Larsson, NP    Physical Exam: BP (!) 124/57   Pulse 85   Temp 100.3 F (37.9 C) (Rectal)   Resp (!) 22   Ht 6' (1.829 m)   Wt 77.1 kg   SpO2 99%   BMI 23.06 kg/m   General: 76 y.o. year-old male pleasant, but ill appearing and in no acute distress.  Alert and oriented x3. HEENT: NCAT, EOMI, dry mucous membrane Neck: Supple, trachea medial Cardiovascular: Regular rate and rhythm with no rubs or gallops.  No thyromegaly or JVD noted.  No lower extremity edema. 2/4 pulses in all 4 extremities. Respiratory: Clear to auscultation with no wheezes or rales. Good inspiratory effort. Abdomen: Soft,  nontender nondistended with normal bowel sounds x4 quadrants. Muskuloskeletal: No cyanosis, clubbing or edema noted bilaterally Neuro: CN II-XII intact, strength 5/5 x 4, sensation, reflexes intact Skin: No ulcerative lesions noted or rashes Psychiatry: Judgement and insight appear normal. Mood is appropriate for condition and setting          Labs on Admission:  Basic Metabolic Panel: Recent Labs  Lab 03/20/22 0353 03/21/22 0304 03/21/22 2010 03/22/22 0305 03/23/22 0030 03/26/22 1246  NA 135 136 134* 135 133* 132*  K 3.9 3.6 3.6 3.5 3.3* 4.1  CL 95* 98 95* 98 95* 98  CO2 '30 29 27 29 29 26  '$ GLUCOSE 135* 135* 205* 143* 142* 150*  BUN 27* 30* 29* 29* 27* 19  CREATININE 0.84 0.93 0.98 0.91 0.82 0.80  CALCIUM 8.6* 8.4* 8.3* 8.2* 7.9* 8.0*  MG 1.9 2.0  --  2.2 1.9  --   PHOS 4.2 4.5  --  3.8 2.9  --    Liver Function Tests: Recent Labs  Lab 03/20/22 0353 03/21/22 0304 03/23/22 0030 03/26/22 1246  AST 13* 14* 18 19  ALT '16 16 23 19  '$ ALKPHOS 44 45 51 64  BILITOT 1.2 1.1 0.9 1.0  PROT 6.2* 6.2* 5.6* 7.2  ALBUMIN 2.8* 3.0* 2.7* 3.2*   No results for input(s): "LIPASE", "AMYLASE" in the last 168 hours. No results for input(s): "AMMONIA" in the last 168 hours. CBC: Recent Labs  Lab 03/20/22 0353 03/21/22 0304 03/22/22 0305 03/23/22 0030 03/26/22 1246  WBC 24.3* 16.5* 19.4* 17.3* 26.5*  NEUTROABS 22.4* 14.3* 17.4* 14.7* 21.5*  HGB 10.8* 10.6* 10.3* 9.7* 10.9*  HCT 32.1* 31.8* 30.7* 29.3* 32.9*  MCV 96.7 99.1 99.4 100.3* 101.5*  PLT 301 319 324 301 312   Cardiac Enzymes: No results for input(s): "CKTOTAL", "CKMB", "CKMBINDEX", "TROPONINI" in the last 168 hours.  BNP (last 3  results) Recent Labs    05/06/21 2320 05/16/21 1717  BNP 135.0* 174.0*    ProBNP (last 3 results) No results for input(s): "PROBNP" in the last 8760 hours.  CBG: Recent Labs  Lab 03/22/22 2316 03/23/22 0315 03/23/22 0748 03/23/22 1206 03/26/22 1254  GLUCAP 102* 208* 184* 167*  147*    Radiological Exams on Admission: CT Head Wo Contrast  Result Date: 03/26/2022 CLINICAL DATA:  Dizziness. EXAM: CT HEAD WITHOUT CONTRAST TECHNIQUE: Contiguous axial images were obtained from the base of the skull through the vertex without intravenous contrast. RADIATION DOSE REDUCTION: This exam was performed according to the departmental dose-optimization program which includes automated exposure control, adjustment of the mA and/or kV according to patient size and/or use of iterative reconstruction technique. COMPARISON:  11/17/2021 FINDINGS: Brain: There is moderate central and cortical atrophy. Periventricular white matter changes are consistent with small vessel disease. Small, remote lacunar infarcts are identified within the basal ganglia bilaterally and appear stable. There is no intra or extra-axial fluid collection or mass lesion. The basilar cisterns and ventricles have a normal appearance. There is no CT evidence for acute infarction or hemorrhage. Vascular: There is dense atherosclerotic calcification of the internal carotid arteries. No hyperdense vessels. Skull: Normal. Negative for fracture or focal lesion. Sinuses/Orbits: No acute finding. Other: None IMPRESSION: 1. Atrophy and small vessel disease. 2. Remote lacunar basal ganglia infarcts. 3.  No evidence for acute intracranial abnormality. Electronically Signed   By: Nolon Nations M.D.   On: 03/26/2022 15:30   DG Chest Port 1 View  Result Date: 03/26/2022 CLINICAL DATA:  Shortness of breath, hypertension EXAM: PORTABLE CHEST 1 VIEW COMPARISON:  03/15/2022 FINDINGS: Cardiomegaly. Both lungs are clear. The visualized skeletal structures are unremarkable. IMPRESSION: Cardiomegaly without acute abnormality of the lungs in AP portable projection. Electronically Signed   By: Delanna Ahmadi M.D.   On: 03/26/2022 13:07    EKG: I independently viewed the EKG done and my findings are as followed: Normal sinus rhythm at a rate of 88 bpm  with nonspecific T wave abnormality and QTc 513 minutes  Assessment/Plan Present on Admission:  Leukocytosis  Hypertension  Hyperlipidemia  Acute respiratory failure with hypoxia ([place])  Cervical spondylosis with myelopathy  Hypothyroidism  Protein-calorie malnutrition, severe  Bilateral carotid artery disease (HCC)  GERD (gastroesophageal reflux disease)  Prolonged QT interval  Hyponatremia  Principal Problem:   UTI (urinary tract infection) Active Problems:   Acute respiratory failure with hypoxia (HCC)   Cervical spondylosis with myelopathy   Leukocytosis   Hypertension   Hyperlipidemia   Type 2 diabetes mellitus (HCC)   Hypothyroidism   Protein-calorie malnutrition, severe   Bilateral carotid artery disease (HCC)   Macrocytic anemia   Prolonged QT interval   GERD (gastroesophageal reflux disease)   Hyponatremia   Dehydration   Chronic diastolic CHF (congestive heart failure) ([place])  UTI POA Patient complained of burning sensation that has been ongoing prior to being discharged from [facility] He was empirically started on IV ceftriaxone, we shall continue with same at this time Continue Tylenol as needed for fever Urine culture and blood culture pending  Leukocytosis possibly secondary to above WBC has been elevated since last 1 week Continue treatment as described above and continue to monitor WBC with morning labs  Generalized weakness and failure to thrive in adult This may be due to multifactorial including UTI, ongoing weakness due to surgical intervention, decreased fluid and food intake Continue fall precaution and neurochecks Protein supplement will  be provided Continue PT/OT eval and treat  Hyponatremia/ Dehydration Na 132, IV hydration was provided  Macrocytic anemia MCV 101.5, H/H10.9/32.9 (hemoglobin within baseline range-9.7-10.9) Continue vitamin B12 Folate and vitamin B12 levels will be checked  Prolonged QT interval QTc 513 ms, Avoid QT  prolonging drugs Magnesium level will be checked Continue telemetry  Hypoalbuminemia possibly secondary to mild protein calorie malnutrition Albumin 3.2, protein supplement will be provided  T2DM with hyperglycemia Hemoglobin A1c at 6.1 on 03/04/2022 Continue ISS and hypoglycemic protocol  Chronic diastolic CHF Continue total input/output, daily weights and fluid restriction Continue Cardiac diet  Continue Coreg, Crestor Echocardiogram done on 02/16/2021 showed LVEF of 55 to 60%.  No RWMA, mild LVH, G1 DD  Hypertension Continue Coreg  Hyperlipidemia Continue Crestor  CAD Continue Nitrostat Crestor and Coreg  Acquired hypothyroidism Continue Synthroid  GERD Continue Protonix  Restless leg syndrome Continue ropinirole  Severe cervical spondylosis with myelopathy s/p elective C5 corpectom Stable  DVT prophylaxis: Lovenox  Code Status: Full code  Consults: None  Family Communication: Wife at bedside (all questions answered to satisfaction)  Severity of Illness: The appropriate patient status for this patient is INPATIENT. Inpatient status is judged to be reasonable and necessary in order to provide the required intensity of service to ensure the patient's safety. The patient's presenting symptoms, physical exam findings, and initial radiographic and laboratory data in the context of their chronic comorbidities is felt to place them at high risk for further clinical deterioration. Furthermore, it is not anticipated that the patient will be medically stable for discharge from the hospital within 2 midnights of admission.   * I certify that at the point of admission it is my clinical judgment that the patient will require inpatient hospital care spanning beyond 2 midnights from the point of admission due to high intensity of service, high risk for further deterioration and high frequency of surveillance required.*  Author: Bernadette Hoit, DO 03/26/2022 5:43 PM  For on  call review [email or web address].

## 2022-03-26 NOTE — ED Provider Notes (Signed)
[facility] EMERGENCY DEPARTMENT Provider Note   CSN: [id number] Arrival date & time: 03/26/22  1222     History  Chief Complaint  Patient presents with   Hypotension    MD from SNF states patient was 83% on 3L and  hypotensive, EMS reports 119/66 on their arrival to SNF    [name] is a 76 y.o. male.  Patient has a history of hypertension diabetes and had recent surgery to his neck June 22.  Patient had a corpectomy on C5.  Patient was sent over from the nursing home for hypoxia and hypotension.  The history is provided by the nursing home and the spouse.  Weakness Severity:  Moderate Onset quality:  Gradual Timing:  Constant Progression:  Waxing and waning Chronicity:  Recurrent Context: alcohol use   Relieved by:  Nothing Worsened by:  Nothing      Home Medications Prior to Admission medications   Medication Sig Start Date End Date Taking? Authorizing Provider  acetaminophen (TYLENOL) 325 MG tablet Take 2 tablets (650 mg total) by mouth every 4 (four) hours as needed for mild pain ((score 1 to 3) or temp > 100.5). 03/23/22   Consuella Lose, MD  b complex vitamins tablet Take 1 tablet by mouth daily.    [provider]  carvedilol (COREG) 3.125 MG tablet Take 1 tablet (3.125 mg total) by mouth 2 (two) times daily with a meal. 03/23/22   Consuella Lose, MD  Cholecalciferol (VITAMIN D3) 5000 units TABS Take 5,000 Units by mouth daily at 6 (six) AM.    [provider]  Continuous Blood Gluc Receiver (FREESTYLE LIBRE 14 DAY READER) DEVI  12/07/19   [provider]  Continuous Blood Gluc Sensor (FREESTYLE LIBRE 14 DAY SENSOR) MISC APPLY ONE SENSOR ONTO SKIN EVERY 14 DAYS 12/21/21   Paseda, Dewaine Conger, FNP  Cyanocobalamin 1500 MCG TBDP Take 1,500 mcg by mouth daily.     [provider]  enoxaparin (LOVENOX) 40 MG/0.4ML injection Inject 0.4 mLs (40 mg total) into the skin daily. 03/23/22   Consuella Lose, MD  furosemide (LASIX) 40  MG tablet TAKE ONE TABLET ('40MG'$  TOTAL) BY MOUTH TWO TIMES DAILY. 01/18/22   [name], MD  gabapentin (NEURONTIN) 100 MG capsule Take 100-200 mg by mouth at bedtime. 03/02/22   [provider]  glimepiride (AMARYL) 4 MG tablet TAKE ONE TABLET ('4MG'$  TOTAL) BY MOUTH DAILY WITH BREAKFAST 10/19/21   Paseda, Dewaine Conger, FNP  levothyroxine (SYNTHROID) 88 MCG tablet Take 1 tablet (88 mcg total) by mouth daily. 01/27/21   Noreene Larsson, NP  magnesium 30 MG tablet Take 30 mg by mouth daily.    [provider]  methocarbamol (ROBAXIN) 500 MG tablet Take 1 tablet (500 mg total) by mouth every 6 (six) hours as needed for muscle spasms. 03/23/22   Consuella Lose, MD  Multiple Vitamin (MULTIVITAMIN WITH MINERALS) TABS tablet Take 1 tablet by mouth daily.    [provider]  nitroGLYCERIN (NITROSTAT) 0.4 MG SL tablet Place 1 tablet (0.4 mg total) under the tongue every 5 (five) minutes as needed for chest pain. 03/02/22   [name], MD  Nutritional Supplements (FEEDING SUPPLEMENT, NEPRO CARB STEADY,) LIQD Take 237 mLs by mouth 3 (three) times daily between meals. 03/23/22   Consuella Lose, MD  pantoprazole (PROTONIX) 40 MG tablet TAKE ONE TABLET BY MOUTH ONCE DAILY Patient taking differently: Take 40 mg by mouth daily. 10/15/21   Erenest Rasher, PA-C  potassium chloride (KLOR-CON) 10 MEQ tablet TAKE ONE TABLET (10MEQ TOTAL) BY MOUTH DAILY Patient taking differently: Take 10 mEq by mouth daily. 05/11/21   [name], MD  rOPINIRole (REQUIP) 0.5 MG tablet Take 0.5 mg by mouth 3 (three) times daily.    [provider]  rosuvastatin (CRESTOR) 20 MG tablet TAKE ONE TABLET ('20MG'$  TOTAL) BY MOUTH DAILY Patient taking differently: Take 20 mg by mouth daily. 10/19/21   Renee Rival, FNP  traZODone (DESYREL) 50 MG tablet Take 1 tablet (50 mg total) by mouth at bedtime. 03/23/22   Consuella Lose, MD  UNABLE TO FIND Home health supplies for CPAP 09/30/20    Perlie Mayo, NP  UNABLE TO FIND [place] sock aid to help with support hose. Size XL.  Dx:I73.9, G25.81, R60.9 01/14/21   Noreene Larsson, NP      Allergies    Codeine and Tramadol    Review of Systems   Review of Systems  Unable to perform ROS: Other  Neurological:  Positive for weakness.    Physical Exam Updated Vital Signs BP 119/61   Pulse 87   Temp 100.3 F (37.9 C) (Rectal)   Resp (!) 27   Ht 6' (1.829 m)   Wt 77.1 kg   SpO2 97%   BMI 23.06 kg/m  Physical Exam Vitals and nursing note reviewed.  Constitutional:      Appearance: He is well-developed.     Comments: Lethargic  HENT:     Head: Normocephalic.     Nose: Nose normal.  Eyes:     General: No scleral icterus.    Conjunctiva/sclera: Conjunctivae normal.  Neck:     Thyroid: No thyromegaly.     Comments: Healing scar to neck minimal swelling Cardiovascular:     Rate and Rhythm: Normal rate and regular rhythm.     Heart sounds: No murmur heard.    No friction rub. No gallop.  Pulmonary:     Breath sounds: No stridor. No wheezing or rales.  Chest:     Chest wall: No tenderness.  Abdominal:     General: There is no distension.     Tenderness: There is no abdominal tenderness. There is no rebound.  Musculoskeletal:        General: Normal range of motion.     Cervical back: Neck supple.  Lymphadenopathy:     Cervical: No cervical adenopathy.  Skin:    Findings: No erythema or rash.  Neurological:     Mental Status: He is oriented to person, place, and time.     Motor: No abnormal muscle tone.     Coordination: Coordination normal.  Psychiatric:        Behavior: Behavior normal.     ED Results / Procedures / Treatments   Labs (all labs ordered are listed, but only abnormal results are displayed) Labs Reviewed  CBC WITH DIFFERENTIAL/PLATELET - Abnormal; Notable for the following components:      Result Value   WBC 26.5 (*)    RBC 3.24 (*)    Hemoglobin 10.9 (*)    HCT 32.9 (*)    MCV  101.5 (*)    RDW 16.8 (*)    Neutro Abs 21.5 (*)    Lymphs Abs 0.5 (*)    Monocytes Absolute 4.5 (*)    All other components within normal limits  COMPREHENSIVE METABOLIC PANEL - Abnormal; Notable for the following components:   Sodium 132 (*)    Glucose, Bld 150 (*)  Calcium 8.0 (*)    Albumin 3.2 (*)    All other components within normal limits  CBG MONITORING, ED - Abnormal; Notable for the following components:   Glucose-Capillary 147 (*)    All other components within normal limits  CULTURE, BLOOD (ROUTINE X 2)  CULTURE, BLOOD (ROUTINE X 2)  LACTIC ACID, PLASMA  LACTIC ACID, PLASMA  URINALYSIS, ROUTINE W REFLEX MICROSCOPIC    EKG None  Radiology DG Chest Port 1 View  Result Date: 03/26/2022 CLINICAL DATA:  Shortness of breath, hypertension EXAM: PORTABLE CHEST 1 VIEW COMPARISON:  03/15/2022 FINDINGS: Cardiomegaly. Both lungs are clear. The visualized skeletal structures are unremarkable. IMPRESSION: Cardiomegaly without acute abnormality of the lungs in AP portable projection. Electronically Signed   By: Delanna Ahmadi M.D.   On: 03/26/2022 13:07    Procedures Procedures    Medications Ordered in ED Medications  sodium chloride 0.9 % bolus 1,000 mL (has no administration in time range)  cefTRIAXone (ROCEPHIN) 2 g in sodium chloride 0.9 % 100 mL IVPB (has no administration in time range)  sodium chloride 0.9 % bolus 1,000 mL (1,000 mLs Intravenous New Bag/Given 03/26/22 1255)  acetaminophen (TYLENOL) suppository 650 mg (650 mg Rectal Given 03/26/22 1335)    ED Course/ Medical Decision Making/ A&P  CRITICAL CARE Performed by: Milton Ferguson Total critical care time: 40 minutes Critical care time was exclusive of separately billable procedures and treating other patients. Critical care was necessary to treat or prevent imminent or life-threatening deterioration. Critical care was time spent personally by me on the following activities: development of treatment plan with  patient and/or surrogate as well as nursing, discussions with consultants, evaluation of patient's response to treatment, examination of patient, obtaining history from patient or surrogate, ordering and performing treatments and interventions, ordering and review of laboratory studies, ordering and review of radiographic studies, pulse oximetry and re-evaluation of patient's condition.    Patient has significant leukocytosis.  I spoke to neurosurgery Dr. Ellene Route and he does not think this is related to the surgery.  He is recommending admission.  Urinalysis suggesting UTI                         Medical Decision Making Amount and/or Complexity of Data Reviewed Labs: ordered. Radiology: ordered. ECG/medicine tests: ordered.  Risk OTC drugs. Prescription drug management. Decision regarding hospitalization.  This patient presents to the ED for concern of weakness hypotension, this involves an extensive number of treatment options, and is a complaint that carries with it a high risk of complications and morbidity.  The differential diagnosis includes sepsis, urinary tract infection   Co morbidities that complicate the patient evaluation  Diabetes hypertension and recent neck surgery   Additional history obtained:  Additional history obtained from spouse External records from outside source obtained and reviewed including hospital records   Lab Tests:  I Ordered, and personally interpreted labs.  The pertinent results include: CBC shows white count 26.5, urinalysis shows 11-20 white cells on cath specimen   Imaging Studies ordered:  I ordered imaging studies including CT head  I independently visualized and interpreted imaging which showed no acute disease I agree with the radiologist interpretation   Cardiac Monitoring: / EKG:  The patient was maintained on a cardiac monitor.  I personally viewed and interpreted the cardiac monitored which showed an underlying rhythm of:  Normal sinus rhythm   Consultations Obtained:  I requested consultation with the neurosurgery and hospital,  and discussed lab and imaging findings as well as pertinent plan - they recommend: Admit to medicine and treated for infection   Problem List / ED Course / Critical interventions / Medication management  Diabetes hypertension possible UTI recent neck surgery I ordered medication including Rocephin and vancomycin for infection Reevaluation of the patient after these medicines showed that the patient stayed the same I have reviewed the patients home medicines and have made adjustments as needed   Social Determinants of Health:  Nursing help   Test / Admission - Considered:  No additional test needed  Patient with leukocytosis.  Possibly related to urinary tract infection.  He is started on Rocephin and will be admitted to medicine        Final Clinical Impression(s) / ED Diagnoses Final diagnoses:  None    Rx / DC Orders ED Discharge Orders     None         Milton Ferguson, MD 03/26/22 1706

## 2022-03-26 NOTE — Progress Notes (Signed)
Nurse attempted to call patient's wife to let her know the patient is moving to a room on 300.

## 2022-03-26 NOTE — Progress Notes (Signed)
Wife, Darril Patriarca aware of patient in room 317.Transferred call to room to talk with patient.

## 2022-03-27 DIAGNOSIS — E8809 Other disorders of plasma-protein metabolism, not elsewhere classified: Secondary | ICD-10-CM | POA: Diagnosis present

## 2022-03-27 DIAGNOSIS — I110 Hypertensive heart disease with heart failure: Secondary | ICD-10-CM | POA: Diagnosis present

## 2022-03-27 DIAGNOSIS — D72829 Elevated white blood cell count, unspecified: Secondary | ICD-10-CM | POA: Diagnosis present

## 2022-03-27 DIAGNOSIS — Z885 Allergy status to narcotic agent status: Secondary | ICD-10-CM | POA: Diagnosis not present

## 2022-03-27 DIAGNOSIS — M4712 Other spondylosis with myelopathy, cervical region: Secondary | ICD-10-CM | POA: Diagnosis not present

## 2022-03-27 DIAGNOSIS — R627 Adult failure to thrive: Secondary | ICD-10-CM | POA: Diagnosis present

## 2022-03-27 DIAGNOSIS — N390 Urinary tract infection, site not specified: Secondary | ICD-10-CM | POA: Diagnosis present

## 2022-03-27 DIAGNOSIS — I779 Disorder of arteries and arterioles, unspecified: Secondary | ICD-10-CM | POA: Diagnosis not present

## 2022-03-27 DIAGNOSIS — G2581 Restless legs syndrome: Secondary | ICD-10-CM | POA: Diagnosis present

## 2022-03-27 DIAGNOSIS — Z79890 Hormone replacement therapy (postmenopausal): Secondary | ICD-10-CM | POA: Diagnosis not present

## 2022-03-27 DIAGNOSIS — A419 Sepsis, unspecified organism: Secondary | ICD-10-CM | POA: Diagnosis not present

## 2022-03-27 DIAGNOSIS — E43 Unspecified severe protein-calorie malnutrition: Secondary | ICD-10-CM | POA: Diagnosis present

## 2022-03-27 DIAGNOSIS — E1165 Type 2 diabetes mellitus with hyperglycemia: Secondary | ICD-10-CM | POA: Diagnosis present

## 2022-03-27 DIAGNOSIS — E1159 Type 2 diabetes mellitus with other circulatory complications: Secondary | ICD-10-CM

## 2022-03-27 DIAGNOSIS — A4159 Other Gram-negative sepsis: Secondary | ICD-10-CM | POA: Diagnosis present

## 2022-03-27 DIAGNOSIS — I429 Cardiomyopathy, unspecified: Secondary | ICD-10-CM | POA: Diagnosis present

## 2022-03-27 DIAGNOSIS — I959 Hypotension, unspecified: Secondary | ICD-10-CM | POA: Diagnosis present

## 2022-03-27 DIAGNOSIS — L89322 Pressure ulcer of left buttock, stage 2: Secondary | ICD-10-CM

## 2022-03-27 DIAGNOSIS — E039 Hypothyroidism, unspecified: Secondary | ICD-10-CM | POA: Diagnosis present

## 2022-03-27 DIAGNOSIS — Z8249 Family history of ischemic heart disease and other diseases of the circulatory system: Secondary | ICD-10-CM | POA: Diagnosis not present

## 2022-03-27 DIAGNOSIS — Z7984 Long term (current) use of oral hypoglycemic drugs: Secondary | ICD-10-CM | POA: Diagnosis not present

## 2022-03-27 DIAGNOSIS — E860 Dehydration: Secondary | ICD-10-CM | POA: Diagnosis present

## 2022-03-27 DIAGNOSIS — I5032 Chronic diastolic (congestive) heart failure: Secondary | ICD-10-CM | POA: Diagnosis present

## 2022-03-27 DIAGNOSIS — D539 Nutritional anemia, unspecified: Secondary | ICD-10-CM | POA: Diagnosis present

## 2022-03-27 DIAGNOSIS — R0902 Hypoxemia: Secondary | ICD-10-CM | POA: Diagnosis present

## 2022-03-27 DIAGNOSIS — E1151 Type 2 diabetes mellitus with diabetic peripheral angiopathy without gangrene: Secondary | ICD-10-CM | POA: Diagnosis present

## 2022-03-27 DIAGNOSIS — E871 Hypo-osmolality and hyponatremia: Secondary | ICD-10-CM | POA: Diagnosis present

## 2022-03-27 DIAGNOSIS — Z79899 Other long term (current) drug therapy: Secondary | ICD-10-CM | POA: Diagnosis not present

## 2022-03-27 DIAGNOSIS — G4733 Obstructive sleep apnea (adult) (pediatric): Secondary | ICD-10-CM | POA: Diagnosis present

## 2022-03-27 DIAGNOSIS — L8990 Pressure ulcer of unspecified site, unspecified stage: Secondary | ICD-10-CM | POA: Insufficient documentation

## 2022-03-27 LAB — COMPREHENSIVE METABOLIC PANEL
ALT: 17 U/L (ref 0–44)
AST: 15 U/L (ref 15–41)
Albumin: 2.6 g/dL — ABNORMAL LOW (ref 3.5–5.0)
Alkaline Phosphatase: 56 U/L (ref 38–126)
Anion gap: 6 (ref 5–15)
BUN: 16 mg/dL (ref 8–23)
CO2: 26 mmol/L (ref 22–32)
Calcium: 7.9 mg/dL — ABNORMAL LOW (ref 8.9–10.3)
Chloride: 102 mmol/L (ref 98–111)
Creatinine, Ser: 0.66 mg/dL (ref 0.61–1.24)
GFR, Estimated: >60 mL/min (ref >60)
Glucose, Bld: 146 mg/dL — ABNORMAL HIGH (ref 70–99)
Potassium: 3.6 mmol/L (ref 3.5–5.1)
Sodium: 134 mmol/L — ABNORMAL LOW (ref 135–145)
Total Bilirubin: 0.8 mg/dL (ref 0.3–1.2)
Total Protein: 6.1 g/dL — ABNORMAL LOW (ref 6.5–8.1)

## 2022-03-27 LAB — APTT: aPTT: 43 seconds — ABNORMAL HIGH (ref 24–36)

## 2022-03-27 LAB — PHOSPHORUS: Phosphorus: 3.1 mg/dL (ref 2.5–4.6)

## 2022-03-27 LAB — VITAMIN B12: Vitamin B-12: 1605 pg/mL — ABNORMAL HIGH (ref 180–914)

## 2022-03-27 LAB — GLUCOSE, CAPILLARY
Glucose-Capillary: 131 mg/dL — ABNORMAL HIGH (ref 70–99)
Glucose-Capillary: 170 mg/dL — ABNORMAL HIGH (ref 70–99)
Glucose-Capillary: 223 mg/dL — ABNORMAL HIGH (ref 70–99)

## 2022-03-27 LAB — CBC
HCT: 28 % — ABNORMAL LOW (ref 39.0–52.0)
Hemoglobin: 9.2 g/dL — ABNORMAL LOW (ref 13.0–17.0)
MCH: 33.7 pg (ref 26.0–34.0)
MCHC: 32.9 g/dL (ref 30.0–36.0)
MCV: 102.6 fL — ABNORMAL HIGH (ref 80.0–100.0)
Platelets: 309 10*3/uL (ref 150–400)
RBC: 2.73 MIL/uL — ABNORMAL LOW (ref 4.22–5.81)
RDW: 17.1 % — ABNORMAL HIGH (ref 11.5–15.5)
WBC: 31.7 10*3/uL — ABNORMAL HIGH (ref 4.0–10.5)
nRBC: 0 % (ref 0.0–0.2)

## 2022-03-27 LAB — MAGNESIUM: Magnesium: 2.1 mg/dL (ref 1.7–2.4)

## 2022-03-27 LAB — FOLATE: Folate: 13.8 ng/mL (ref >5.9)

## 2022-03-27 NOTE — Assessment & Plan Note (Signed)
-   Resume home oral hypoglycemic agents. -Modified carbohydrates diet discussed with patient. -Continue to follow CBGs and adjust hypoglycemic regimen as required.

## 2022-03-27 NOTE — Evaluation (Signed)
Physical Therapy Evaluation Patient Details Name: [name] MRN: [id number] DOB: 03-04-1946 Today's Date: 03/27/2022  History of Present Illness  [name] is a 76 y.o. male with medical history significant of hypertension hyperlipidemia, chronic diastolic CHF, CAD, T6RW, hypothyroidism, GERD, RLS, and severe cervical spondylosis with myelopathy s/p elective C5 corpectomy and subsequent evacuation of anterior cervical hematoma with removal and replacement of hardware (03/11/2022) who presents to the emergency department via EMS from a  nursing home due to a report of hypotension and hypoxia.  However, on arrival of EMS team, BP was 119/66.  At bedside, patient complained of generalized weakness that has been ongoing since his surgery, wife at bedside states that patient has not been able to ambulate with his walker since the surgery due to weakness.  Patient complained of burning sensation on urination that has been ongoing prior to being discharged from [facility].  Patient also had dysphagia after the surgery but is now tolerating dysphagia 3 diet.  He denies nausea, vomiting, chest pain, abdominal pain.    Patient was admitted from 6/22 to 7/4 at [facility] due to cervical spondylosis with myelopathy which was repaired as described above.   Clinical Impression  Patient limited for functional mobility as stated below secondary to weakness, fatigue and poor activity tolerance. Patient stating need to go to bathroom, attempted transition to seated EOB for transfer to [facility] but patient unable stating weakness despite max assist provided. Patient repositioned in supine and assisted with rolling to place on bed pan. Patient will benefit from continued physical therapy in hospital and recommended venue below to increase strength, balance, endurance for safe ADLs and gait.        Recommendations for follow up therapy are one component of a multi-disciplinary discharge planning process, led by the  attending physician.  Recommendations may be updated based on patient status, additional functional criteria and insurance authorization.  Follow Up Recommendations Skilled nursing-short term rehab (<3 hours/day) Can patient physically be transported by private vehicle: No    Assistance Recommended at Discharge Frequent or constant Supervision/Assistance  Patient can return home with the following  A lot of help with bathing/dressing/bathroom;Assistance with cooking/housework;Direct supervision/assist for medications management;Direct supervision/assist for financial management;Assist for transportation;Help with stairs or ramp for entrance;A lot of help with walking and/or transfers    Equipment Recommendations None recommended by PT  Recommendations for Other Services       Functional Status Assessment Patient has had a recent decline in their functional status and demonstrates the ability to make significant improvements in function in a reasonable and predictable amount of time.     Precautions / Restrictions Precautions Precautions: Cervical;Fall Restrictions Weight Bearing Restrictions: No      Mobility  Bed Mobility Overal bed mobility: Needs Assistance Bed Mobility: Rolling, Supine to Sit, Sit to Supine Rolling: Mod assist, Max assist   Supine to sit: Max assist Sit to supine: Mod assist   General bed mobility comments: assist to manage trunk and primarily to lift legs in/out of bed. attempted transition to EOB but patient limited by weakness and fatigue    Transfers                        Ambulation/Gait                  Stairs            Wheelchair Mobility    Modified Rankin (Stroke Patients Only)  Balance                                             Pertinent Vitals/Pain Pain Assessment Pain Assessment: Faces Faces Pain Scale: Hurts little more Pain Location: generalized with mobility Pain  Descriptors / Indicators: Grimacing, Sore Pain Intervention(s): Limited activity within patient's tolerance, Monitored during session, Repositioned    Home Living Family/patient expects to be discharged to:: Private residence Living Arrangements: Spouse/significant other Available Help at Discharge: Family Type of Home: House       Alternate Level Stairs-Number of Steps: 8 stairs down with rail on L, 8 steps up with rail on both sides Home Layout: Multi-level Home Equipment: Conservation officer, nature (2 wheels) Additional Comments: Coming from SNF rehab following prior admission    Prior Function Prior Level of Function : Independent/Modified Independent;Driving;History of Falls (last six months)             Mobility Comments: pt reports "maybe 25" falls in last 6 months. reports has multiple RW throughout his house, but has been leaving and managing in community well ADLs Comments: ADLs, IADLs, and driving     Hand Dominance   Dominant Hand: Left    Extremity/Trunk Assessment   Upper Extremity Assessment Upper Extremity Assessment: Defer to OT evaluation    Lower Extremity Assessment Lower Extremity Assessment: Generalized weakness    Cervical / Trunk Assessment Cervical / Trunk Assessment: Neck Surgery  Communication   Communication: No difficulties  Cognition Arousal/Alertness: Awake/alert Behavior During Therapy: WFL for tasks assessed/performed Overall Cognitive Status: Within Functional Limits for tasks assessed                                          General Comments      Exercises     Assessment/Plan    PT Assessment Patient needs continued PT services  PT Problem List Decreased strength;Decreased activity tolerance;Decreased balance;Decreased mobility;Decreased coordination       PT Treatment Interventions DME instruction;Gait training;Stair training;Functional mobility training;Therapeutic activities;Therapeutic exercise;Balance  training;Patient/family education;Neuromuscular re-education    PT Goals (Current goals can be found in the Care Plan section)  Acute Rehab PT Goals Patient Stated Goal: get stronger PT Goal Formulation: With patient Time For Goal Achievement: 04/10/22 Potential to Achieve Goals: Fair    Frequency Min 3X/week     Co-evaluation               AM-PAC PT "6 Clicks" Mobility  Outcome Measure Help needed turning from your back to your side while in a flat bed without using bedrails?: A Lot Help needed moving from lying on your back to sitting on the side of a flat bed without using bedrails?: Total Help needed moving to and from a bed to a chair (including a wheelchair)?: Total Help needed standing up from a chair using your arms (e.g., wheelchair or bedside chair)?: Total Help needed to walk in hospital room?: Total Help needed climbing 3-5 steps with a railing? : Total 6 Click Score: 7    End of Session Equipment Utilized During Treatment: Oxygen Activity Tolerance: Patient limited by fatigue Patient left: in bed;with call bell/phone within reach;with bed alarm set Nurse Communication: Mobility status;Other (comment) (on bed pan) PT Visit Diagnosis: Unsteadiness on feet (R26.81);Other abnormalities of gait  and mobility (R26.89);History of falling (Z91.81);Muscle weakness (generalized) (M62.81);Difficulty in walking, not elsewhere classified (R26.2);Other symptoms and signs involving the nervous system (R29.898)    Time: 3790-2409 PT Time Calculation (min) (ACUTE ONLY): 17 min   Charges:   PT Evaluation $PT Eval Moderate Complexity: 1 Mod PT Treatments $Therapeutic Activity: 8-22 mins        11:46 AM, 03/27/22 Mearl Latin PT, DPT Physical Therapist at [facility]

## 2022-03-27 NOTE — Assessment & Plan Note (Signed)
-   Minimize the use of medications that can further prolong QT -Continue telemetry monitoring -Follow electrolytes and replete them as needed.

## 2022-03-27 NOTE — Assessment & Plan Note (Signed)
-  Continue to follow daily weights and maintain adequate hydration -Currently compensated -Patient denies shortness of breath or orthopnea. -resume adjusted dose of lasix and follow low sodium diet.

## 2022-03-27 NOTE — Assessment & Plan Note (Signed)
-   Feeding supplements and multivitamin as per dietitian recommendations -Patient has not been eating well since recent hospitalization and surgical intervention. -Continue supportive care, continue adequate hydration.

## 2022-03-27 NOTE — Assessment & Plan Note (Signed)
-   Stable overall -Continue current antihypertensive agents.

## 2022-03-27 NOTE — Assessment & Plan Note (Addendum)
-  Continue the use of statins and adequate blood pressure control; as part of risk factor modifications.

## 2022-03-27 NOTE — Assessment & Plan Note (Signed)
-   Continue constant repositioning and preventive measures. -Pressure injury present at time of admission; no signs of superimposed infection.

## 2022-03-27 NOTE — Assessment & Plan Note (Signed)
-  Complaining of burning sensation while urinating at time of admission. -Cultures demonstrated positive Proteus species sensitive to ceftriaxone. -Patient will be discharged on oral Bactrim in order to complete antibiotic therapy (4 more days pending at discharge). -Currently afebrile and reporting no dysuria. -Continue supportive care and adequate hydration.

## 2022-03-27 NOTE — Assessment & Plan Note (Signed)
-  Continue outpatient follow-up with neurosurgery -Continue analgesics and muscle relaxants as previously prescribed. -Continue physical therapy for rehabilitation.

## 2022-03-27 NOTE — Plan of Care (Signed)
  Problem: Acute Rehab PT Goals(only PT should resolve) Goal: Pt Will Go Supine/Side To Sit Outcome: Progressing Flowsheets (Taken 03/27/2022 1147) Pt will go Supine/Side to Sit: with minimal assist Goal: Pt Will Go Sit To Supine/Side Outcome: Progressing Flowsheets (Taken 03/27/2022 1147) Pt will go Sit to Supine/Side: with minimal assist Goal: Patient Will Transfer Sit To/From Stand Outcome: Progressing Flowsheets (Taken 03/27/2022 1147) Patient will transfer sit to/from stand:  with minimal assist  with moderate assist Goal: Pt Will Transfer Bed To Chair/Chair To Bed Outcome: Progressing Flowsheets (Taken 03/27/2022 1147) Pt will Transfer Bed to Chair/Chair to Bed:  with min assist  with mod assist Goal: Pt/caregiver will Perform Home Exercise Program Outcome: Progressing Flowsheets (Taken 03/27/2022 1147) Pt/caregiver will Perform Home Exercise Program:  For increased strengthening  For improved balance  Independently  For increased ROM  11:48 AM, 03/27/22 Mearl Latin PT, DPT Physical Therapist at [facility]

## 2022-03-27 NOTE — Progress Notes (Signed)
Progress Note   Patient: [name] PRX:[id number] DOB: 1946-03-05 DOA: 03/26/2022     0 DOS: the patient was seen and examined on 03/27/2022   Brief hospital mission course: As per H&P written by Dr. Josephine Cables on 03/26/2022 [name] is a 76 y.o. male with medical history significant of hypertension hyperlipidemia, chronic diastolic CHF, CAD, M6KM, hypothyroidism, GERD, RLS, and severe cervical spondylosis with myelopathy s/p elective C5 corpectomy and subsequent evacuation of anterior cervical hematoma with removal and replacement of hardware (03/11/2022) who presents to the emergency department via EMS from a  nursing home due to a report of hypotension and hypoxia.  However, on arrival of EMS team, BP was 119/66.  At bedside, patient complained of generalized weakness that has been ongoing since his surgery, wife at bedside states that patient has not been able to ambulate with his walker since the surgery due to weakness.  Patient complained of burning sensation on urination that has been ongoing prior to being discharged from [facility].  Patient also had dysphagia after the surgery but is now tolerating dysphagia 3 diet.  He denies nausea, vomiting, chest pain, abdominal pain.   Patient was admitted from 6/22 to 7/4 at [facility] due to cervical spondylosis with myelopathy which was repaired as described above.   ED Course:  In the emergency department, temperature was 100.43F, she was intermittently tachypneic and other vital signs were within normal range, O2 sats ranged within 95-97% on 3 LPM of oxygen.  Work-up in the ED showed leukocytosis and macrocytic anemia.  BMP was normal except for hyponatremia and hyperglycemia.  Albumin 3.2, lactic acid x2 was negative, urinalysis showed moderate leukocytes and few bacteria, proteinuria and has a cloudy appearance. CT head without contrast showed no evidence for acute intracranial abnormality Chest x-ray showed cardiomegaly without acute  abnormality of the lungs in AP portable projection Neurosurgeon (Dr. Ellene Route) was consulted due to patient's elevated WBC level, he does not think it was related to the surgery.  He does not think the patient needs to be transferred to [facility], however, he will consult on patient if admitted to [facility]. Empiric IV antibiotics (ceftriaxone and vancomycin) were given, Tylenol was given and IV hydration was provided.  Hospitalist was asked to admit patient for further evaluation and management.  Assessment and Plan: * UTI (urinary tract infection) - Complaining of burning sensation while urinating -Continue current antibiotics and follow culture results -Continue to maintain adequate hydration -Continue as needed analgesics and antipyretics.  Cervical spondylosis with myelopathy - Continue outpatient follow-up with neurosurgery -Continue analgesics -Continue physical therapy for rehabilitation.  Leukocytosis - In the setting of UTI -Continue to follow culture results and continue IV antibiotics-follow trend.  Hyperlipidemia - Continue the use of statins.  Hypertension - Stable overall -Continue current antihypertensive agents.   Type 2 diabetes mellitus (HCC) - Continue sliding scale insulin while inpatient -Modified carbohydrate diet discussed with patient -Continue to follow CBGs and adjust hypoglycemic regimen as required.  Hypothyroidism - Continue Synthroid.  Protein-calorie malnutrition, severe - Feeding supplements and multivitamin as per dietitian recommendations -Patient has not been eating well since recent hospitalization and surgical intervention. -Continue supportive care, continue adequate hydration.  Pressure injury of skin - Continue constant repositioning and preventive measures. -Pressure injury present at time of admission; no signs of superimposed infection.  Chronic diastolic CHF (congestive heart failure) (HCC) - Continue to follow daily weights and maintain  adequate hydration -Currently compensated -Patient denies shortness of breath or orthopnea.  Dehydration - Continue fluid resuscitation -Follow volume status.  Hyponatremia - In the setting of dehydration/decreased oral intake. -Maintain adequate hydration -Follow electrolytes trend.  GERD (gastroesophageal reflux disease) - Continue PPI.  Prolonged QT interval - Minimize the use of medications that can further prolong QT -Continue telemetry monitoring -Follow electrolytes and replete as needed.  Macrocytic anemia - U98 and folic acid within normal limits -Continue to follow hemoglobin trend.  Bilateral carotid artery disease (HCC) - Continue the use of statins and adequate blood pressure control; as part of risk factor modifications.    Subjective:  Afebrile currently; low-grade temperature overnight.  Reports no chest pain, no nausea, no vomiting.  Generalized weakness and deconditioning appreciated.  Nurses reported some intermittent confusion.  Physical Exam: Vitals:   03/27/22 0245 03/27/22 0532 03/27/22 0829 03/27/22 1312  BP:  (!) 124/50 138/61 (!) 120/56  Pulse:  83 92 91  Resp:  18  18  Temp: 99 F (37.2 C) 97.6 F (36.4 C)  98.3 F (36.8 C)  TempSrc: Oral Oral  Oral  SpO2:  100%  97%  Weight:      Height:       General exam: Alert, awake, oriented x 2; following commands and expressing generalized weakness and upper back pain.  Low-grade temperature overnight. Respiratory system: Clear to auscultation. Respiratory effort normal.  Good air movement bilaterally. Cardiovascular system:RRR. No rubs or gallops; no JVD. Gastrointestinal system: Abdomen is nondistended, soft and nontender. No organomegaly or masses felt. Normal bowel sounds heard. Central nervous system: Alert and oriented. No focal neurological deficits. Extremities: No cyanosis or clubbing. Skin: No petechiae; stage II left buttock pressure injury present at time of admission.  No signs of  superimposed infection.  Also with close incision around his neck from recent neurosurgical intervention.  Wounds looks clean and intact. Psychiatry: Mood & affect appropriate.   Data Reviewed: B12 1,605 Comprehensive metabolic panel: Sodium 119, potassium 3.6, chloride 102, bicarb 26, BUN 16, creatinine 0.66; normal LFTs. Folate: 13.8 CBC: WBC 31.7, hemoglobin 9.2, platelet count 309K. Magnesium 2.1 Phosphorus 3.1    Family Communication: No family at bedside.  Disposition: Status is: Inpatient Remains inpatient appropriate because: Receiving IV antibiotics for UTI.   Planned Discharge Destination: Skilled nursing facility patient was admitted from SNF and will return there for rehab and condiitoning.    Author: Barton Dubois, MD 03/27/2022 4:44 PM  For on call review [email or web address].

## 2022-03-27 NOTE — Assessment & Plan Note (Signed)
Continue PPI ?

## 2022-03-27 NOTE — Assessment & Plan Note (Signed)
-  fluid resuscitation provided. -advise to maintain adequate hydration.

## 2022-03-27 NOTE — Assessment & Plan Note (Signed)
-  In the setting of dehydration/decreased oral intake. -Continue to maintain adequate hydration -Follow electrolytes trend. -Sodium level last checked 133 and stable. -patient asymptomatic.

## 2022-03-27 NOTE — Assessment & Plan Note (Signed)
-   Continue the use of statins.

## 2022-03-27 NOTE — Assessment & Plan Note (Signed)
-  B63 and folic acid within normal limits -Continue to follow hemoglobin trend.

## 2022-03-27 NOTE — Assessment & Plan Note (Signed)
-  Continue Synthroid. -Outpatient follow-up for thyroid panel.

## 2022-03-27 NOTE — Assessment & Plan Note (Addendum)
-  In the setting of UTI -Continue to follow culture results and continue IV antibiotics-follow trend. -WBCs over 30,000 at time of admission; down to 12.9 (significantly improved) -Continue current treatment. -Repeat CBC to follow WBCs trend.

## 2022-03-27 NOTE — Progress Notes (Signed)
Lab called and gave a notice of gram positive rods (aerobic) in one bottle.  Notified primary nurse and MD via secure chat.  Patient is currently on rocephin.  No new orders received at this time.

## 2022-03-28 DIAGNOSIS — M4712 Other spondylosis with myelopathy, cervical region: Secondary | ICD-10-CM | POA: Diagnosis not present

## 2022-03-28 DIAGNOSIS — I5032 Chronic diastolic (congestive) heart failure: Secondary | ICD-10-CM | POA: Diagnosis not present

## 2022-03-28 DIAGNOSIS — N390 Urinary tract infection, site not specified: Secondary | ICD-10-CM | POA: Diagnosis not present

## 2022-03-28 DIAGNOSIS — I779 Disorder of arteries and arterioles, unspecified: Secondary | ICD-10-CM | POA: Diagnosis not present

## 2022-03-28 LAB — GLUCOSE, CAPILLARY
Glucose-Capillary: 134 mg/dL — ABNORMAL HIGH (ref 70–99)
Glucose-Capillary: 155 mg/dL — ABNORMAL HIGH (ref 70–99)
Glucose-Capillary: 173 mg/dL — ABNORMAL HIGH (ref 70–99)
Glucose-Capillary: 136 mg/dL — ABNORMAL HIGH (ref 70–99)

## 2022-03-28 LAB — CULTURE, BLOOD (ROUTINE X 2)

## 2022-03-28 MED ORDER — CEFTRIAXONE SODIUM 2 G IJ SOLR
2.0000 g | INTRAMUSCULAR | Status: DC
  Administered 2022-03-29 – 2022-03-30 (×2): 2 g via INTRAVENOUS
  Filled 2022-03-28 (×2): qty 20

## 2022-03-28 NOTE — Progress Notes (Signed)
   03/28/22 1928  Assess: MEWS Score  Resp (!) 26  Level of Consciousness Alert  Assess: MEWS Score  MEWS Temp 0  MEWS Systolic 0  MEWS Pulse 0  MEWS RR 2  MEWS LOC 0  MEWS Score 2  MEWS Score Color Yellow  Assess: if the MEWS score is Yellow or Red  Were vital signs taken at a resting state? Yes  Focused Assessment No change from prior assessment  Does the patient meet 2 or more of the SIRS criteria? No  MEWS guidelines implemented *See Row Information* Yes  Notify: Charge Nurse/RN  Name of Charge Nurse/RN Notified Deeann Dowse, RN  Date Charge Nurse/RN Notified 03/28/22  Time Charge Nurse/RN Notified 2040  Assess: SIRS CRITERIA  SIRS Temperature  0  SIRS Pulse 0  SIRS Respirations  1  SIRS WBC 0  SIRS Score Sum  1

## 2022-03-28 NOTE — Progress Notes (Signed)
Progress Note   Patient: [name]:[id number] DOB: [birth year] DOA: 03/26/2022     1 DOS: the patient was seen and examined on 03/28/2022   Brief hospital mission course: As per H&P written by [name] on 03/26/2022 [name] is a 76 y.o. male with medical history significant of hypertension hyperlipidemia, chronic diastolic CHF, CAD, N5AO, hypothyroidism, GERD, RLS, and severe cervical spondylosis with myelopathy s/p elective C5 corpectomy and subsequent evacuation of anterior cervical hematoma with removal and replacement of hardware (03/11/2022) who presents to the emergency department via EMS from a  nursing home due to a report of hypotension and hypoxia.  However, on arrival of EMS team, BP was 119/66.  At bedside, patient complained of generalized weakness that has been ongoing since his surgery, wife at bedside states that patient has not been able to ambulate with his walker since the surgery due to weakness.  Patient complained of burning sensation on urination that has been ongoing prior to being discharged from [facility].  Patient also had dysphagia after the surgery but is now tolerating dysphagia 3 diet.  He denies nausea, vomiting, chest pain, abdominal pain.   Patient was admitted from 6/22 to 7/4 at [facility] due to cervical spondylosis with myelopathy which was repaired as described above.   ED Course:  In the emergency department, temperature was 100.33F, she was intermittently tachypneic and other vital signs were within normal range, O2 sats ranged within 95-97% on 3 LPM of oxygen.  Work-up in the ED showed leukocytosis and macrocytic anemia.  BMP was normal except for hyponatremia and hyperglycemia.  Albumin 3.2, lactic acid x2 was negative, urinalysis showed moderate leukocytes and few bacteria, proteinuria and has a cloudy appearance. CT head without contrast showed no evidence for acute intracranial abnormality Chest x-ray showed cardiomegaly without acute  abnormality of the lungs in AP portable projection Neurosurgeon (Dr. Ellene Route) was consulted due to patient's elevated WBC level, he does not think it was related to the surgery.  He does not think the patient needs to be transferred to [facility], however, he will consult on patient if admitted to [facility]. Empiric IV antibiotics (ceftriaxone and vancomycin) were given, Tylenol was given and IV hydration was provided.  Hospitalist was asked to admit patient for further evaluation and management.  Assessment and Plan: * UTI (urinary tract infection) - Complaining of burning sensation while urinating -Continue current antibiotics and follow culture results -Continue to maintain adequate hydration -Continue as needed analgesics and antipyretics.  Cervical spondylosis with myelopathy - Continue outpatient follow-up with neurosurgery -Continue analgesics -Continue physical therapy for rehabilitation.  Leukocytosis - In the setting of UTI -Continue to follow culture results and continue IV antibiotics-follow trend.  Hyperlipidemia - Continue the use of statins.  Hypertension - Stable overall -Continue current antihypertensive agents.   Type 2 diabetes mellitus (HCC) - Continue sliding scale insulin while inpatient -Modified carbohydrate diet discussed with patient -Continue to follow CBGs and adjust hypoglycemic regimen as required.  Hypothyroidism - Continue Synthroid.  Protein-calorie malnutrition, severe - Feeding supplements and multivitamin as per dietitian recommendations -Patient has not been eating well since recent hospitalization and surgical intervention. -Continue supportive care, continue adequate hydration.  Pressure injury of skin - Continue constant repositioning and preventive measures. -Pressure injury present at time of admission; no signs of superimposed infection.  Chronic diastolic CHF (congestive heart failure) (HCC) - Continue to follow daily weights and maintain  adequate hydration -Currently compensated -Patient denies shortness of breath or orthopnea.  Dehydration - Continue fluid resuscitation -Follow volume status.  Hyponatremia - In the setting of dehydration/decreased oral intake. -Maintain adequate hydration -Follow electrolytes trend.  GERD (gastroesophageal reflux disease) - Continue PPI.  Prolonged QT interval - Minimize the use of medications that can further prolong QT -Continue telemetry monitoring -Follow electrolytes and replete as needed.  Macrocytic anemia - K99 and folic acid within normal limits -Continue to follow hemoglobin trend.  Bilateral carotid artery disease (HCC) - Continue the use of statins and adequate blood pressure control; as part of risk factor modifications.  Positive gram-negative rods bacteremia -Final speciation and sensitivity pending -Rocephin increased to 2 g daily.  Subjective:  Weak and deconditioned; nursing reporting positive episodes of coughing spells while swallowing.  Afebrile, no chest pain, no nausea, no vomiting.  Positive gram-negative rods blood culture.  Physical Exam: Vitals:   03/27/22 1312 03/27/22 2156 03/28/22 0348 03/28/22 1352  BP: (!) 120/56 (!) 120/48 (!) 136/51 125/67  Pulse: 91 84 80 72  Resp: '18 18 18 18  '$ Temp: 98.3 F (36.8 C) 99.2 F (37.3 C) 98.9 F (37.2 C) 98.9 F (37.2 C)  TempSrc: Oral Oral Oral Oral  SpO2: 97% 96% 96% 99%  Weight:      Height:       General exam: Alert, awake and more oriented on today's evaluation; reports no chest pain, no nausea, no vomiting.  Nursing has noticed that son coughing spells while swallowing.  Patient afebrile. Respiratory system: Positive rhonchi, no using accessory muscle, no crackles, normal effort. Cardiovascular system:RRR. No rubs or gallops; no JVD. Gastrointestinal system: Abdomen is nondistended, soft and nontender. No organomegaly or masses felt. Normal bowel sounds heard. Central nervous system: Alert  and oriented. No focal neurological deficits. Extremities: No cyanosis or clubbing. Skin: No petechiae; stage II left buttock pressure injury present at time of admission without superimposed infection.  Also at time of admission patient with close incisions around his neck from recent neurosurgical intervention. Psychiatry: Judgement and insight appear normal. Mood & affect appropriate.    Data Reviewed: CBGs in the 150s to 170s -Blood cultures positive for gram-negative rods.    Family Communication: No family at bedside.  Disposition: Status is: Inpatient Remains inpatient appropriate because: Receiving IV antibiotics for UTI.   Planned Discharge Destination: Skilled nursing facility patient was admitted from SNF and will return there for rehab and condiitoning.    Author: Barton Dubois, MD 03/28/2022 7:10 PM  For on call review [email or web address].

## 2022-03-29 ENCOUNTER — Ambulatory Visit (HOSPITAL_COMMUNITY): Payer: HMO | Admitting: Physical Therapy

## 2022-03-29 DIAGNOSIS — M4712 Other spondylosis with myelopathy, cervical region: Secondary | ICD-10-CM | POA: Diagnosis not present

## 2022-03-29 DIAGNOSIS — I5032 Chronic diastolic (congestive) heart failure: Secondary | ICD-10-CM | POA: Diagnosis not present

## 2022-03-29 DIAGNOSIS — N390 Urinary tract infection, site not specified: Secondary | ICD-10-CM | POA: Diagnosis not present

## 2022-03-29 DIAGNOSIS — I779 Disorder of arteries and arterioles, unspecified: Secondary | ICD-10-CM | POA: Diagnosis not present

## 2022-03-29 LAB — BASIC METABOLIC PANEL
Anion gap: 7 (ref 5–15)
BUN: 11 mg/dL (ref 8–23)
CO2: 25 mmol/L (ref 22–32)
Calcium: 7.8 mg/dL — ABNORMAL LOW (ref 8.9–10.3)
Chloride: 101 mmol/L (ref 98–111)
Creatinine, Ser: 0.52 mg/dL — ABNORMAL LOW (ref 0.61–1.24)
GFR, Estimated: >60 mL/min (ref >60)
Glucose, Bld: 125 mg/dL — ABNORMAL HIGH (ref 70–99)
Potassium: 3.8 mmol/L (ref 3.5–5.1)
Sodium: 133 mmol/L — ABNORMAL LOW (ref 135–145)

## 2022-03-29 LAB — CBC
HCT: 26.1 % — ABNORMAL LOW (ref 39.0–52.0)
Hemoglobin: 8.5 g/dL — ABNORMAL LOW (ref 13.0–17.0)
MCH: 33.2 pg (ref 26.0–34.0)
MCHC: 32.6 g/dL (ref 30.0–36.0)
MCV: 102 fL — ABNORMAL HIGH (ref 80.0–100.0)
Platelets: 275 10*3/uL (ref 150–400)
RBC: 2.56 MIL/uL — ABNORMAL LOW (ref 4.22–5.81)
RDW: 16.7 % — ABNORMAL HIGH (ref 11.5–15.5)
WBC: 12.9 10*3/uL — ABNORMAL HIGH (ref 4.0–10.5)
nRBC: 0 % (ref 0.0–0.2)

## 2022-03-29 LAB — URINE CULTURE: Culture: >=100000 — AB

## 2022-03-29 LAB — GLUCOSE, CAPILLARY
Glucose-Capillary: 148 mg/dL — ABNORMAL HIGH (ref 70–99)
Glucose-Capillary: 175 mg/dL — ABNORMAL HIGH (ref 70–99)
Glucose-Capillary: 175 mg/dL — ABNORMAL HIGH (ref 70–99)
Glucose-Capillary: 226 mg/dL — ABNORMAL HIGH (ref 70–99)

## 2022-03-29 NOTE — Progress Notes (Signed)
Patient has been alert with a small amount  of confusion.  Respirations still increased, though not as much as earlier ins shift. Vitals stable. No complaints from patient through shift.

## 2022-03-29 NOTE — Progress Notes (Signed)
Progress Note   Patient: [name] IPJ:[id number] DOB: 06/13/1946 DOA: 03/26/2022     2 DOS: the patient was seen and examined on 03/29/2022   Brief hospital mission course: As per H&P written by Dr. Josephine Cables on 03/26/2022 [name] is a 76 y.o. male with medical history significant of hypertension hyperlipidemia, chronic diastolic CHF, CAD, B3AL, hypothyroidism, GERD, RLS, and severe cervical spondylosis with myelopathy s/p elective C5 corpectomy and subsequent evacuation of anterior cervical hematoma with removal and replacement of hardware (03/11/2022) who presents to the emergency department via EMS from a  nursing home due to a report of hypotension and hypoxia.  However, on arrival of EMS team, BP was 119/66.  At bedside, patient complained of generalized weakness that has been ongoing since his surgery, wife at bedside states that patient has not been able to ambulate with his walker since the surgery due to weakness.  Patient complained of burning sensation on urination that has been ongoing prior to being discharged from [facility].  Patient also had dysphagia after the surgery but is now tolerating dysphagia 3 diet.  He denies nausea, vomiting, chest pain, abdominal pain.   Patient was admitted from 6/22 to 7/4 at [facility] due to cervical spondylosis with myelopathy which was repaired as described above.   ED Course:  In the emergency department, temperature was 100.27F, she was intermittently tachypneic and other vital signs were within normal range, O2 sats ranged within 95-97% on 3 LPM of oxygen.  Work-up in the ED showed leukocytosis and macrocytic anemia.  BMP was normal except for hyponatremia and hyperglycemia.  Albumin 3.2, lactic acid x2 was negative, urinalysis showed moderate leukocytes and few bacteria, proteinuria and has a cloudy appearance. CT head without contrast showed no evidence for acute intracranial abnormality Chest x-ray showed cardiomegaly without acute  abnormality of the lungs in AP portable projection Neurosurgeon (Dr. Ellene Route) was consulted due to patient's elevated WBC level, he does not think it was related to the surgery.  He does not think the patient needs to be transferred to [facility], however, he will consult on patient if admitted to [facility]. Empiric IV antibiotics (ceftriaxone and vancomycin) were given, Tylenol was given and IV hydration was provided.  Hospitalist was asked to admit patient for further evaluation and management.  Assessment and Plan: * UTI (urinary tract infection) -Complaining of burning sensation while urinating at time of admission. -Cultures demonstrated positive Proteus species sensitive to ceftriaxone -Continue IV antibiotics while awaiting final speciation and sensitivity of positive blood cultures. -Currently afebrile and reporting no dysuria. -Continue supportive care and adequate hydration.  Cervical spondylosis with myelopathy - Continue outpatient follow-up with neurosurgery -Continue analgesics -Continue physical therapy for rehabilitation.  Leukocytosis -In the setting of UTI -Continue to follow culture results and continue IV antibiotics-follow trend. -WBCs down to 12.9 (significantly improved) -Continue current treatment.  Hyperlipidemia - Continue the use of statins.  Hypertension - Stable overall -Continue current antihypertensive agents.   Type 2 diabetes mellitus (HCC) - Continue sliding scale insulin while inpatient -Modified carbohydrate diet discussed with patient -Continue to follow CBGs and adjust hypoglycemic regimen as required.  Hypothyroidism - Continue Synthroid.  Protein-calorie malnutrition, severe - Feeding supplements and multivitamin as per dietitian recommendations -Patient has not been eating well since recent hospitalization and surgical intervention. -Continue supportive care, continue adequate hydration.  Pressure injury of skin - Continue constant repositioning  and preventive measures. -Pressure injury present at time of admission; no signs of superimposed infection.  Chronic diastolic CHF (congestive heart failure) (HCC) - Continue to follow daily weights and maintain adequate hydration -Currently compensated -Patient denies shortness of breath or orthopnea.  Dehydration - Continue fluid resuscitation -Follow volume status.  Hyponatremia - In the setting of dehydration/decreased oral intake. -Continue to maintain adequate hydration -Follow electrolytes trend. -Sodium level 133 currently  GERD (gastroesophageal reflux disease) - Continue PPI.  Prolonged QT interval - Minimize the use of medications that can further prolong QT -Continue telemetry monitoring -Follow electrolytes and replete as needed.  Macrocytic anemia - U44 and folic acid within normal limits -Continue to follow hemoglobin trend.  Bilateral carotid artery disease (HCC) - Continue the use of statins and adequate blood pressure control; as part of risk factor modifications.  Positive gram-negative rods bacteremia -Final speciation and sensitivity pending -Rocephin increased to 2 g daily. -Follow-up final culture results.  Subjective:  Weak and physically deconditioned; reports some left-sided neck pain today.  No nausea, no vomiting, no chest pain or shortness of breath.  Has remained afebrile.  Physical Exam: Vitals:   03/28/22 2330 03/29/22 0600 03/29/22 0840 03/29/22 1417  BP: (!) 117/56 (!) 143/63 (!) 155/72 126/61  Pulse: 82 80 82 79  Resp: (!) 22 (!) 24  16  Temp:  98 F (36.7 C)  98.6 F (37 C)  TempSrc:      SpO2: 94% 96%  100%  Weight:      Height:       General exam: Alert, awake, oriented x 3; following commands appropriately.  Complaining of left-sided neck pain.  No nausea or vomiting. Respiratory system: Good saturation appreciated on room air; no using accessory muscles.  Normal effort. Cardiovascular system:RRR. No rubs or gallops.  No  JVD. Gastrointestinal system: Abdomen is nondistended, soft and nontender. No organomegaly or masses felt. Normal bowel sounds heard. Central nervous system: No focal deficits. Extremities: No cyanosis or clubbing.  No edema. Skin: No petechiae; stage II left buttock pressure injury present at time of admission without superimposed infection.  Also seen at time of admission patient with close incisions around his neck from recent neurosurgical intervention.  Wounds are clean and intact. Psychiatry: Judgement and insight appear normal. Mood & affect appropriate.    Data Reviewed: CBC: WBC 12.9, hemoglobin 9.5 and platelet count 034 K Basic metabolic panel: Sodium 742, potassium 3.8, chloride 101, bicarb 25, BUN 11, creatinine 0.52. Gram-positive rods isolated in his aerobic blood cultures.   Family Communication: No family at bedside.  Disposition: Status is: Inpatient Remains inpatient appropriate because: Receiving IV antibiotics for UTI.   Planned Discharge Destination: Skilled nursing facility patient was admitted from SNF and will return there for rehab and condiitoning.    Author: Barton Dubois, MD 03/29/2022 2:57 PM  For on call review [email or web address].

## 2022-03-29 NOTE — NC FL2 (Signed)
[place] LEVEL OF CARE SCREENING TOOL     IDENTIFICATION  Patient Name: [name] Birthdate: 10-Apr-1946 Sex: male Admission Date (Current Location): 03/26/2022  [facility] and [place] Number:  [employer] and Address:  [facility] [address] [place]      Provider Number: [id number]  Attending Physician Name and Address:  Barton Dubois, MD  Relative Name and Phone Number:  Tyquan, Carmickle (Spouse)   [phone]    Current Level of Care: Hospital Recommended Level of Care: [place] Prior Approval Number:    Date Approved/Denied:   PASRR Number: [id number]  Discharge Plan: SNF    Current Diagnoses: Patient Active Problem List   Diagnosis Date Noted   Pressure injury of skin 03/27/2022   UTI (urinary tract infection) 03/26/2022   Dehydration 03/26/2022   Chronic diastolic CHF (congestive heart failure) ([place]) 03/26/2022   Leukocytosis 03/20/2022   Insomnia 03/20/2022   Acute respiratory failure with hypoxia ([place]) 03/18/2022   Protein-calorie malnutrition, severe 03/16/2022   Cervical spondylosis with myelopathy 03/11/2022   Cervical myelopathy ([place]) 02/23/2022   Hospital discharge follow-up 11/26/2021   Hyponatremia 11/26/2021   Prolonged QT interval 11/16/2021   GERD (gastroesophageal reflux disease) 11/16/2021   Blurred vision 11/16/2021   Hypothyroidism 10/05/2021   Hypokalemia 05/18/2021   Muscular deconditioning 05/12/2021   Frequent falls 05/12/2021   Fatigue 04/29/2021   Recent unexplained weight loss 04/23/2021   Left shoulder pain 04/23/2021   Chest tightness 04/23/2021   Deep tissue injury 04/23/2021   Myoclonus 04/15/2021   Pneumonia 04/03/2021   PNA (pneumonia) 04/02/2021   Abnormal finding on urinalysis 04/01/2021   Syncope 02/16/2021   Minor head injury    Right hip pain 02/11/2021   Erectile dysfunction 01/22/2021   Gait abnormality 09/30/2020   Mild non proliferative  diabetic retinopathy ([place]) 07/16/2020   Need for immunization against influenza 06/03/2020   S/P left knee arthroscopy 05/06/20 05/13/2020   Abnormal MRI 03/26/2020   Joint disorder of knee 03/26/2020   Fall at home, initial encounter 03/13/2020   Macrocytic anemia 09/03/2019   Reflux esophagitis 09/03/2019   Dysphagia 05/30/2019   Constipation 05/30/2019   Positive colorectal cancer screening using Cologuard test 05/30/2019   Diabetic foot ulcer ([place]) 09/06/2018   Orthostasis 08/25/2016   Congestive heart failure ([place]) 05/17/2016   Bilateral carotid artery disease ([place]) 07/08/2014   Obstructive sleep apnea 02/12/2014   Restless legs 10/23/2013   Overweight with body mass index (BMI) of 29 to 29.9 in adult 10/23/2013   Hyperlipidemia 07/16/2013   Peripheral arterial disease ([place]) 07/16/2013   Hypertension 03/14/2012   Type 2 diabetes mellitus ([place]) 03/14/2012   Peripheral neuropathy 03/14/2012   Cardiomyopathy- EF NL 01/2013, now 30-35% echo 03/14/2012    Orientation RESPIRATION BLADDER Height & Weight     Self, Time, Situation, Place  Normal External catheter Weight: 170 lb (77.1 kg) Height:  6' (182.9 cm)  BEHAVIORAL SYMPTOMS/MOOD NEUROLOGICAL BOWEL NUTRITION STATUS      Continent Diet (dys 3)  AMBULATORY STATUS COMMUNICATION OF NEEDS Skin   Limited Assist Verbally PU Stage and Appropriate Care (Stae 2 buttocks left)                       Personal Care Assistance Level of Assistance  Bathing, Feeding, Dressing Bathing Assistance: Limited assistance Feeding assistance: Independent Dressing Assistance: Limited assistance     Functional Limitations Info  Sight, Hearing, Speech Sight Info:  Adequate Hearing Info: Impaired (has hearing aids.) Speech Info: Adequate    SPECIAL CARE FACTORS FREQUENCY  PT (By licensed PT), OT (By licensed OT) Blood Pressure Frequency: n/a   PT Frequency: 5x/week OT Frequency: 3x/week            Contractures      Additional  Factors Info  Code Status, Allergies Code Status Info: full code Allergies Info: codeine, tramadol           Current Medications (03/29/2022):  This is the current hospital active medication list Current Facility-Administered Medications  Medication Dose Route Frequency Provider Last Rate Last Admin   acetaminophen (TYLENOL) tablet 650 mg  650 mg Oral Q6H PRN Adefeso, Oladapo, DO   650 mg at 03/29/22 1428   carvedilol (COREG) tablet 3.125 mg  3.125 mg Oral BID WC Adefeso, Oladapo, DO   3.125 mg at 03/29/22 1659   cefTRIAXone (ROCEPHIN) 2 g in sodium chloride 0.9 % 100 mL IVPB  2 g Intravenous Q24H Barton Dubois, MD 200 mL/hr at 03/29/22 0844 2 g at 03/29/22 0844   enoxaparin (LOVENOX) injection 40 mg  40 mg Subcutaneous Q24H Adefeso, Oladapo, DO   40 mg at 03/28/22 1707   feeding supplement (GLUCERNA SHAKE) (GLUCERNA SHAKE) liquid 237 mL  237 mL Oral TID BM Adefeso, Oladapo, DO   237 mL at 03/28/22 0902   insulin aspart (novoLOG) injection 0-15 Units  0-15 Units Subcutaneous TID WC Adefeso, Oladapo, DO   3 Units at 03/29/22 1702   levothyroxine (SYNTHROID) tablet 88 mcg  88 mcg Oral Daily Adefeso, Oladapo, DO   88 mcg at 03/29/22 0840   nitroGLYCERIN (NITROSTAT) SL tablet 0.4 mg  0.4 mg Sublingual Q5 min PRN Adefeso, Oladapo, DO       pantoprazole (PROTONIX) EC tablet 40 mg  40 mg Oral Daily Adefeso, Oladapo, DO   40 mg at 03/29/22 0840   rOPINIRole (REQUIP) tablet 0.5 mg  0.5 mg Oral TID Adefeso, Oladapo, DO   0.5 mg at 03/29/22 1659   rosuvastatin (CRESTOR) tablet 20 mg  20 mg Oral Daily Adefeso, Oladapo, DO   20 mg at 03/29/22 7253     Discharge Medications: Please see discharge summary for a list of discharge medications.  Relevant Imaging Results:  Relevant Lab Results:   Additional Information SSN [id number] Clydene Pugh, LCSW

## 2022-03-29 NOTE — Progress Notes (Signed)
Physical Therapy Treatment Patient Details Name: [name] MRN: [id number] DOB: 1945-10-20 Today's Date: 03/29/2022   History of Present Illness [name] is a 76 y.o. male with medical history significant of hypertension hyperlipidemia, chronic diastolic CHF, CAD, H2CN, hypothyroidism, GERD, RLS, and severe cervical spondylosis with myelopathy s/p elective C5 corpectomy and subsequent evacuation of anterior cervical hematoma with removal and replacement of hardware (03/11/2022) who presents to the emergency department via EMS from a  nursing home due to a report of hypotension and hypoxia.  However, on arrival of EMS team, BP was 119/66.  At bedside, patient complained of generalized weakness that has been ongoing since his surgery, wife at bedside states that patient has not been able to ambulate with his walker since the surgery due to weakness.  Patient complained of burning sensation on urination that has been ongoing prior to being discharged from [facility].  Patient also had dysphagia after the surgery but is now tolerating dysphagia 3 diet.  He denies nausea, vomiting, chest pain, abdominal pain.    Patient was admitted from 6/22 to 7/4 at [facility] due to cervical spondylosis with myelopathy which was repaired as described above.    PT Comments    Patient supine in bed upon therapist arrival and agreeable to participating in therapy session today. Patient reports he would like to transfer to [facility] and then to chair. Patient required mod to max assist to manage trunk and to lift legs out of bed. Therapist careful to not involve cervical spine during mobility assist. After sitting at EOB for 10 minutes with min assist and B upper extremities as support, patient ready to attempt transfer to [facility]. Patient require assist x2 for safety to transfer bed to [facility] as patient unable to fully weight bear throughout his lower extremities with knees buckling in stance. Patient unable to unload either  lower extremities with assistance to side step to [facility].  Nursing in room while patient on [facility] with plan to transfer patient to chair with 2 person assist once completed with BSC. Patient would continue to benefit from skilled physical therapy in current environment and next venue to continue return to prior function and increase strength, endurance, balance, coordination, and functional mobility and gait skills.     Recommendations for follow up therapy are one component of a multi-disciplinary discharge planning process, led by the attending physician.  Recommendations may be updated based on patient status, additional functional criteria and insurance authorization.  Follow Up Recommendations  Skilled nursing-short term rehab (<3 hours/day) Can patient physically be transported by private vehicle: No   Assistance Recommended at Discharge Frequent or constant Supervision/Assistance  Patient can return home with the following A lot of help with bathing/dressing/bathroom;Assistance with cooking/housework;Direct supervision/assist for medications management;Direct supervision/assist for financial management;Assist for transportation;Help with stairs or ramp for entrance;A lot of help with walking and/or transfers   Equipment Recommendations  None recommended by PT    Recommendations for Other Services       Precautions / Restrictions Precautions Precautions: Cervical;Fall Restrictions Weight Bearing Restrictions: No     Mobility  Bed Mobility Overal bed mobility: Needs Assistance Bed Mobility: Rolling, Supine to Sit Rolling: Mod assist, Max assist   Supine to sit: Max assist, Mod assist     General bed mobility comments: assist to manage trunk and primarily to lift legs out of bed.    Transfers   Equipment used: 2 person hand held assist Transfers: Sit to/from Stand, Bed to chair/wheelchair/BSC  Sit to Stand: Mod assist, From elevated surface Stand pivot transfers: Max assist,  +2 physical assistance, From elevated surface     Anterior-Posterior transfers: Min assist        Ambulation/Gait       Stairs       Wheelchair Mobility    Modified Rankin (Stroke Patients Only)       Balance Overall balance assessment: Needs assistance Sitting-balance support: Feet supported, Bilateral upper extremity supported, Single extremity supported Sitting balance-Leahy Scale: Poor Sitting balance - Comments: MinA and UE support to sit statically EOB   Standing balance support: Bilateral upper extremity supported, During functional activity Standing balance-Leahy Scale: Zero Standing balance comment: unable to stand without max assist        Cognition Arousal/Alertness: Awake/alert Behavior During Therapy: WFL for tasks assessed/performed Overall Cognitive Status: Within Functional Limits for tasks assessed        Exercises      General Comments General comments (skin integrity, edema, etc.): steristrips over anteriorolateral left neck surgical incision noted      Pertinent Vitals/Pain Pain Assessment Pain Assessment: 0-10 Pain Score: 8  Pain Location: headache Pain Descriptors / Indicators: Grimacing Pain Intervention(s): Limited activity within patient's tolerance, Monitored during session, Repositioned    Home Living          Prior Function            PT Goals (current goals can now be found in the care plan section) Acute Rehab PT Goals Patient Stated Goal: get stronger PT Goal Formulation: With patient Time For Goal Achievement: 04/10/22 Potential to Achieve Goals: Fair Progress towards PT goals: Progressing toward goals    Frequency    Min 3X/week      PT Plan Current plan remains appropriate       AM-PAC PT "6 Clicks" Mobility   Outcome Measure  Help needed turning from your back to your side while in a flat bed without using bedrails?: A Lot Help needed moving from lying on your back to sitting on the side of  a flat bed without using bedrails?: Total Help needed moving to and from a bed to a chair (including a wheelchair)?: Total Help needed standing up from a chair using your arms (e.g., wheelchair or bedside chair)?: Total Help needed to walk in hospital room?: Total Help needed climbing 3-5 steps with a railing? : Total 6 Click Score: 7    End of Session Equipment Utilized During Treatment: Oxygen;Gait belt Activity Tolerance: Patient limited by fatigue Patient left: with call bell/phone within reach;with nursing/sitter in room;Other (comment) (on [facility]) Nurse Communication: Mobility status;Other (comment) (on bed pan) PT Visit Diagnosis: Unsteadiness on feet (R26.81);Other abnormalities of gait and mobility (R26.89);History of falling (Z91.81);Muscle weakness (generalized) (M62.81);Difficulty in walking, not elsewhere classified (R26.2);Other symptoms and signs involving the nervous system (R29.898)     Time: 1220-1250 PT Time Calculation (min) (ACUTE ONLY): 30 min  Charges:  $Therapeutic Activity: 23-37 mins                     Floria Raveling. Hartnett-Rands, MS, PT Per [place] [phone]  Pamala Hurry  Hartnett-Rands 03/29/2022, 1:57 PM

## 2022-03-29 NOTE — Evaluation (Signed)
Clinical/Bedside Swallow Evaluation Patient Details  Name: [name] MRN: [id number] Date of Birth: 06-Feb-1946  Today's Date: 03/29/2022 Time: SLP Start Time (ACUTE ONLY): 9562 SLP Stop Time (ACUTE ONLY): 1438 SLP Time Calculation (min) (ACUTE ONLY): 18 min  Past Medical History:  Past Medical History:  Diagnosis Date   Aftercare following surgery of the circulatory system, NEC 12/04/2013   Angina decubitus ([place]) 05/19/2016   Arrhythmia 05/20/2016   Arthritis    ARTHRITIS, RIGHT FOOT 06/26/2008   Qualifier: Diagnosis of  By: Aline Brochure MD, [name]     Cardiomyopathy- EF NL 01/2013, now 30-35% echo 03/14/2012   Carotid artery occlusion    left s/p CEA   Cellulitis of left foot 05/07/2018   CHF (congestive heart failure) (HCC)    Critical lower limb ischemia ([place]) 02/28/2014   Critical limb ischemia    Diabetes mellitus    Elevated troponin 04/11/2016   GERD (gastroesophageal reflux disease)    History of stroke June 2013 03/14/2012   HOH (hard of hearing)    Hypertension    Hypothyroidism    Nonhealing skin ulcer ([place]) 10/28/2014   Obstructive sleep apnea    Orthostatic hypotension    Osteomyelitis (HCC)    left great toe   Pain in the chest 05/17/2016   Peripheral arterial disease ([place]), s/p PTA x 2 RLE    nonhealing ulcers bilaterally on each great toe   Pneumonia    PONV (postoperative nausea and vomiting)    Also hard to awake after anesthesia   Pulmonary nodule 05/17/2016   Restless leg syndrome    Shortness of breath    Stroke ([place]) 03/08/2012   Toe osteomyelitis, left (HCC)    Ulcer of great toe, left, with necrosis of bone ([place])    Wears dentures    Past Surgical History:  Past Surgical History:  Procedure Laterality Date   AMPUTATION TOE Left 07/25/2018   Procedure: AMPUTATION TOE INTERPHALANGEAL HALLUX LEFT;  Surgeon: Evelina Bucy, DPM;  Location: [place];  Service: Podiatry;  Laterality: Left;   ANGIOPLASTY  02/28/14   diamond back orbital  rotational atherectomy of Rt. tibial   ANTERIOR CERVICAL CORPECTOMY N/A 03/11/2022   Procedure: Cervical corpectomy Cervical Four with reconstruction using titanium spacer, anterior plate fixation Cervical Four to Cervical Six;  Surgeon: Kristeen Miss, MD;  Location: [facility];  Service: Neurosurgery;  Laterality: N/A;  RM 19   BACK SURGERY     BONE BIOPSY Left 07/25/2018   Procedure: SUPERFICIAL BONE BIOPSY;  Surgeon: Evelina Bucy, DPM;  Location: [place];  Service: Podiatry;  Laterality: Left;   CARDIAC CATHETERIZATION N/A 05/19/2016   Procedure: Right/Left Heart Cath and Coronary Angiography;  Surgeon: Belva Crome, MD;  Location: [place] CV LAB;  Service: Cardiovascular;  Laterality: N/A;   CERVICAL FUSION     ENDARTERECTOMY Left 11/29/2013   Procedure: ENDARTERECTOMY CAROTID;  Surgeon: Serafina Mitchell, MD;  Location: [place];  Service: Vascular;  Laterality: Left;   ESOPHAGOGASTRODUODENOSCOPY  02/2010   Dr. Gala Romney: patient presented with food impaction, schatzki ring with superimposed component of stricture with erosive reflux esophagitis, s/p disimpaction but dilation planned at later date    ESOPHAGOGASTRODUODENOSCOPY (EGD) WITH PROPOFOL N/A 08/13/2019   Dr. Gala Romney: Erosive reflux esophagitis with mild stricture and incidental Mallory-Weiss tear which precluded esophageal dilation.  Medium sized hiatal hernia.   ESOPHAGOGASTRODUODENOSCOPY (EGD) WITH PROPOFOL N/A 10/11/2019   Dr. Gala Romney: Esophageal stenosis status post dilation, moderate hiatal hernia   EVACUATION OF  CERVICAL HEMATOMA N/A 03/11/2022   Procedure: EVACUATION OF ANTERIOR CERVICAL HEMATOMA WITH REMOVAL AND REPLACEMENT OF HARDWARE;  Surgeon: Kristeen Miss, MD;  Location: [place];  Service: Neurosurgery;  Laterality: N/A;   EYE SURGERY     FLEXIBLE SIGMOIDOSCOPY N/A 08/13/2019   Procedure: FLEXIBLE SIGMOIDOSCOPY;  Surgeon: [name], MD;  [facility];  Service: Endoscopy;  Laterality: N/A;  colonoscopy aboerted due to  formed stool and poor prep   FOOT SURGERY     KNEE ARTHROSCOPY WITH MEDIAL MENISECTOMY Left 05/06/2020   Procedure: KNEE ARTHROSCOPY WITH MEDIAL MENISCECTOMY AND LATERAL MENISCECTOMY;  Surgeon: [name], MD;  Location: [facility];  Service: Orthopedics;  Laterality: Left;   Lower ext duplex doppler  03/14/14   Rt ABI 1.2   LOWER EXTREMITY ANGIOGRAM Bilateral 02/18/2014   Procedure: LOWER EXTREMITY ANGIOGRAM;  Surgeon: [name], MD;  Location: [facility] CATH LAB;  Service: Cardiovascular;  Laterality: Bilateral;   LOWER EXTREMITY ANGIOGRAM N/A 10/31/2014   Procedure: LOWER EXTREMITY ANGIOGRAM;  Surgeon: [name], MD;  Location: [facility] CATH LAB;  Service: Cardiovascular;  Laterality: N/A;   MALONEY DILATION N/A 10/11/2019   Procedure: Venia Minks DILATION;  Surgeon: [name], MD;  [facility];  Service: Endoscopy;  Laterality: N/A;   MULTIPLE TOOTH EXTRACTIONS     PV angiogram  02/18/2014   tibial vessel diseas bil.   SPINE SURGERY     tendon achillies lengthing and sesamoid     HPI:  [name] is a 76 y.o. male with medical history significant of hypertension hyperlipidemia, chronic diastolic CHF, CAD, W1UU, hypothyroidism, GERD, RLS, and severe cervical spondylosis with myelopathy s/p elective C5 corpectomy and subsequent evacuation of anterior cervical hematoma with removal and replacement of hardware (03/11/2022) who presents to the emergency department via EMS from a  nursing home due to a report of hypotension and hypoxia.  However, on arrival of EMS team, BP was 119/66.  At bedside, patient complained of generalized weakness that has been ongoing since his surgery, wife at bedside states that patient has not been able to ambulate with his walker since the surgery due to weakness.  Patient complained of burning sensation on urination that has been ongoing prior to being discharged from [facility].  Patient also had dysphagia after the surgery but is now tolerating  dysphagia 3 diet.  He denies nausea, vomiting, chest pain, abdominal pain.    Patient was admitted from 6/22 to 7/4 at [facility] due to cervical spondylosis with myelopathy which was repaired as described above. Pt had MBSS at [facility] on 03/12/22 and 03/16/22 and was discharged from [facility] on 03/23/22 to [facility] SNF on D3/mech soft and thin liquids.    Assessment / Plan / Recommendation  Clinical Impression  Pt being treated for UTI follwing admission from [facility] a few days ago. He initially presented with pharyngeal edema following his corpectomy and c-spine surgery, however was cleared for a mechanical soft diet and thin liquids. Pt denies globus sensation and does not exhibit signs of reduced airway protection during po presentations during today's clinical swallow evaluation. He does indicate that he felt "rice stick" earlier today. Recommend continue D3/mech soft and thin liquids with aspiration  precautions and f/u with SLP back at SNF for upgrades clinically to regular textures if/when appropriate. Continue po medications whole with water and supervision and assist with meals (BUE weakness). SLP will sign off in acute setting. Pt and  family are in in agreement with plan of care. SLP Visit Diagnosis: Dysphagia, unspecified (R13.10)    Aspiration Risk  Mild aspiration risk    Diet Recommendation Dysphagia 3 (Mech soft);Thin liquid   Liquid Administration via: Cup;Straw Medication Administration: Whole meds with liquid Supervision: Staff to assist with self feeding Compensations: Slow rate;Small sips/bites Postural Changes: Seated upright at 90 degrees;Remain upright for at least 30 minutes after po intake    Other  Recommendations Oral Care Recommendations: Oral care BID    Recommendations for follow up therapy are one component of a multi-disciplinary discharge planning process, led by the attending physician.  Recommendations may be updated based on patient status,  additional functional criteria and insurance authorization.  Follow up Recommendations Skilled nursing-short term rehab (<3 hours/day)      Assistance Recommended at Discharge Frequent or constant Supervision/Assistance  Functional Status Assessment Patient has had a recent decline in their functional status and demonstrates the ability to make significant improvements in function in a reasonable and predictable amount of time.  Frequency and Duration            Prognosis Prognosis for Safe Diet Advancement: Good      Swallow Study   General Date of Onset: 03/26/22 HPI: [name] is a 76 y.o. male with medical history significant of hypertension hyperlipidemia, chronic diastolic CHF, CAD, R6VE, hypothyroidism, GERD, RLS, and severe cervical spondylosis with myelopathy s/p elective C5 corpectomy and subsequent evacuation of anterior cervical hematoma with removal and replacement of hardware (03/11/2022) who presents to the emergency department via EMS from a  nursing home due to a report of hypotension and hypoxia.  However, on arrival of EMS team, BP was 119/66.  At bedside, patient complained of generalized weakness that has been ongoing since his surgery, wife at bedside states that patient has not been able to ambulate with his walker since the surgery due to weakness.  Patient complained of burning sensation on urination that has been ongoing prior to being discharged from [facility].  Patient also had dysphagia after the surgery but is now tolerating dysphagia 3 diet.  He denies nausea, vomiting, chest pain, abdominal pain.    Patient was admitted from 6/22 to 7/4 at [facility] due to cervical spondylosis with myelopathy which was repaired as described above. Pt had MBSS at [facility] on 03/12/22 and 03/16/22 and was discharged from [facility] on 03/23/22 to [facility] SNF on D3/mech soft and thin liquids. Type of Study: Bedside Swallow Evaluation Previous Swallow Assessment: MBSS  6/23 and 03/16/22 Diet Prior to this Study: Dysphagia 3 (soft);Thin liquids Temperature Spikes Noted: No Respiratory Status: Room air History of Recent Intubation: Yes Length of Intubations (days): 1 days Date extubated: 03/11/22 Behavior/Cognition: Alert;Cooperative;Pleasant mood Oral Cavity Assessment: Within Functional Limits Oral Care Completed by SLP: Yes Oral Cavity - Dentition: Dentures, top;Dentures, bottom Vision: Functional for self-feeding Self-Feeding Abilities: Needs assist Patient Positioning: Upright in chair Baseline Vocal Quality: Normal;Low vocal intensity Volitional Cough: Strong Volitional Swallow: Able to elicit    Oral/Motor/Sensory Function Overall Oral Motor/Sensory Function: Within functional limits   Ice Chips Ice chips: Within functional limits Presentation: Spoon   Thin Liquid Thin Liquid: Within functional limits Presentation: Straw    Nectar Thick Nectar Thick Liquid: Not tested   Honey Thick Honey Thick Liquid: Not tested   Puree Puree: Within functional limits Presentation: Spoon   Solid     Solid: Within functional limits     Thank you,  Maxamillion Banas  Starleen Blue, [place]  Jihan Rudy 03/29/2022,3:36 PM

## 2022-03-29 NOTE — TOC Initial Note (Signed)
Transition of Care [facility]) - Initial/Assessment Note    Patient Details  Name: [name] MRN: [id number] Date of Birth: 30-Oct-1945  Transition of Care [facility]) CM/SW Contact:    Ihor Gully, LCSW Phone Number: 03/29/2022, 12:21 PM  Clinical Narrative:                 Patient from [facility]. He admitted to rehab on 7/4. Hospitalized on 7/7 for UTI. Spouse plans for him to return to [facility] for rehab at discharge. [name] at [facility] advised that patient will need auth to return to the facility.  Auth started.   Expected Discharge Plan: Skilled Nursing Facility Barriers to Discharge: Continued Medical Work up   Patient Goals and CMS Choice Patient states their goals for this hospitalization and ongoing recovery are:: complete rehab then return home      Expected Discharge Plan and Services Expected Discharge Plan: [place]       Living arrangements for the past 2 months: [place], [place]                                      Prior Living Arrangements/Services Living arrangements for the past 2 months: [place], [place] Lives with:: Spouse, Facility Resident Patient language and need for interpreter reviewed:: Yes        Need for Family Participation in Patient Care: Yes (Comment) Care giver support system in place?: Yes (comment) Current home services: DME Criminal Activity/Legal Involvement Pertinent to Current Situation/Hospitalization: No - Comment as needed  Activities of Daily Living   ADL Screening (condition at time of admission) Patient's cognitive ability adequate to safely complete daily activities?: Yes Is the patient deaf or have difficulty hearing?: Yes Does the patient have difficulty seeing, even when wearing glasses/contacts?: No Does the patient have difficulty concentrating, remembering, or making decisions?: Yes Patient able to express need for assistance  with ADLs?: Yes Does the patient have difficulty dressing or bathing?: Yes Independently performs ADLs?: No Communication: Independent Dressing (OT): Dependent Is this a change from baseline?: Pre-admission baseline Grooming: Dependent Is this a change from baseline?: Pre-admission baseline Feeding: Needs assistance Is this a change from baseline?: Pre-admission baseline Bathing: Dependent Is this a change from baseline?: Pre-admission baseline Toileting: Dependent Is this a change from baseline?: Pre-admission baseline In/Out Bed: Dependent Is this a change from baseline?: Pre-admission baseline Walks in Home: Dependent Is this a change from baseline?: Change from baseline, expected to last <3 days Does the patient have difficulty walking or climbing stairs?: Yes Weakness of Legs: Both Weakness of Arms/Hands: Left  Permission Sought/Granted Permission sought to share information with : Family Supports Permission granted to share information with : Yes, Release of Information Signed  Share Information with NAME: wife, Electrical engineer           Emotional Assessment     Affect (typically observed): Appropriate Orientation: : Oriented to Self, Oriented to Place, Oriented to Situation Alcohol / Substance Use: Not Applicable Psych Involvement: No (comment)  Admission diagnosis:  Leukocytosis [D72.829] Acute sepsis ([place]) [A41.9] Patient Active Problem List   Diagnosis Date Noted   Pressure injury of skin 03/27/2022   UTI (urinary tract infection) 03/26/2022   Dehydration 03/26/2022   Chronic diastolic CHF (congestive heart failure) ([place]) 03/26/2022   Leukocytosis 03/20/2022   Insomnia 03/20/2022   Acute respiratory failure with hypoxia ([place])  03/18/2022   Protein-calorie malnutrition, severe 03/16/2022   Cervical spondylosis with myelopathy 03/11/2022   Cervical myelopathy ([place]) 02/23/2022   Hospital discharge follow-up 11/26/2021   Hyponatremia 11/26/2021   Prolonged QT interval  11/16/2021   GERD (gastroesophageal reflux disease) 11/16/2021   Blurred vision 11/16/2021   Hypothyroidism 10/05/2021   Hypokalemia 05/18/2021   Muscular deconditioning 05/12/2021   Frequent falls 05/12/2021   Fatigue 04/29/2021   Recent unexplained weight loss 04/23/2021   Left shoulder pain 04/23/2021   Chest tightness 04/23/2021   Deep tissue injury 04/23/2021   Myoclonus 04/15/2021   Pneumonia 04/03/2021   PNA (pneumonia) 04/02/2021   Abnormal finding on urinalysis 04/01/2021   Syncope 02/16/2021   Minor head injury    Right hip pain 02/11/2021   Erectile dysfunction 01/22/2021   Gait abnormality 09/30/2020   Mild non proliferative diabetic retinopathy ([place]) 07/16/2020   Need for immunization against influenza 06/03/2020   S/P left knee arthroscopy 05/06/20 05/13/2020   Abnormal MRI 03/26/2020   Joint disorder of knee 03/26/2020   Fall at home, initial encounter 03/13/2020   Macrocytic anemia 09/03/2019   Reflux esophagitis 09/03/2019   Dysphagia 05/30/2019   Constipation 05/30/2019   Positive colorectal cancer screening using Cologuard test 05/30/2019   Diabetic foot ulcer ([place]) 09/06/2018   Orthostasis 08/25/2016   Congestive heart failure ([place]) 05/17/2016   Bilateral carotid artery disease ([place]) 07/08/2014   Obstructive sleep apnea 02/12/2014   Restless legs 10/23/2013   Overweight with body mass index (BMI) of 29 to 29.9 in adult 10/23/2013   Hyperlipidemia 07/16/2013   Peripheral arterial disease ([place]) 07/16/2013   Hypertension 03/14/2012   Type 2 diabetes mellitus ([place]) 03/14/2012   Peripheral neuropathy 03/14/2012   Cardiomyopathy- EF NL 01/2013, now 30-35% echo 03/14/2012   PCP:  Renee Rival, FNP Pharmacy:   [facility] [place] [address] Phone: [phone] Fax: [phone]     Social Determinants of Health (SDOH) Interventions    Readmission Risk Interventions    03/23/2022    11:40 AM  Readmission Risk Prevention Plan  Transportation Screening Complete  HRI or Home Care Consult Complete  Social Work Consult for [place] Planning/Counseling Complete  Palliative Care Screening Not Applicable

## 2022-03-30 ENCOUNTER — Ambulatory Visit: Payer: HMO | Admitting: Nurse Practitioner

## 2022-03-30 DIAGNOSIS — N390 Urinary tract infection, site not specified: Secondary | ICD-10-CM | POA: Diagnosis not present

## 2022-03-30 DIAGNOSIS — A419 Sepsis, unspecified organism: Secondary | ICD-10-CM | POA: Diagnosis not present

## 2022-03-30 DIAGNOSIS — I5032 Chronic diastolic (congestive) heart failure: Secondary | ICD-10-CM | POA: Diagnosis not present

## 2022-03-30 DIAGNOSIS — M4712 Other spondylosis with myelopathy, cervical region: Secondary | ICD-10-CM | POA: Diagnosis not present

## 2022-03-30 LAB — GLUCOSE, CAPILLARY
Glucose-Capillary: 185 mg/dL — ABNORMAL HIGH (ref 70–99)
Glucose-Capillary: 336 mg/dL — ABNORMAL HIGH (ref 70–99)

## 2022-03-30 MED ORDER — FUROSEMIDE 40 MG PO TABS: 40.0000 mg | ORAL_TABLET | Freq: Every day | ORAL | Status: AC

## 2022-03-30 MED ORDER — METHOCARBAMOL 500 MG PO TABS: 500.0000 mg | ORAL_TABLET | Freq: Three times a day (TID) | ORAL | Status: AC | PRN

## 2022-03-30 MED ORDER — SULFAMETHOXAZOLE-TRIMETHOPRIM 800-160 MG PO TABS: 1.0000 | ORAL_TABLET | Freq: Two times a day (BID) | ORAL | 0 refills | Status: AC

## 2022-03-30 NOTE — Assessment & Plan Note (Signed)
-  in the setting of recent neck surgery intervention and deconditioning -seen by Speech therapy with recommendations for dysphagia 3 diet and thin liquids -continue rehab and conditioning at SNF.

## 2022-03-30 NOTE — TOC Transition Note (Signed)
Transition of Care [facility]) - CM/SW Discharge Note   Patient Details  Name: [name] MRN: [id number] Date of Birth: [birth year]  Transition of Care [facility]) CM/SW Contact:  Boneta Lucks, RN Phone Number: 03/30/2022, 10:45 AM   Clinical Narrative:  [employer] received [facility] # [id number] and SNF # [phone]. [name] updated. RN to call report. TOC printed med necessity and will call EMS when RN is ready. FL2 sent in the hub.   Final next level of care: Skilled Nursing Facility Barriers to Discharge: Barriers Resolved   Patient Goals and CMS Choice Patient states their goals for this hospitalization and ongoing recovery are:: complete rehab then return home   Discharge Placement            Patient to be transferred to facility by: EMS Name of family member notified: [name] Patient and family notified of of transfer: 03/30/22  Discharge Plan and Services     Readmission Risk Interventions    03/30/2022   10:42 AM 03/23/2022   11:40 AM  Readmission Risk Prevention Plan  Transportation Screening Complete Complete  PCP or Specialist Appt within 5-7 Days Complete   Home Care Screening Complete   Medication Review (RN CM) Complete   HRI or Home Care Consult  Complete  Social Work Consult for [place] Planning/Counseling  Complete  Palliative Care Screening  Not Applicable

## 2022-03-30 NOTE — Progress Notes (Signed)
Nsg Discharge Note  Admit Date:  03/26/2022 Discharge date: 03/30/2022   [name] to be D/C'd Home per MD order.  AVS completed.  Copy for chart, and copy for patient signed, and dated. Patient/caregiver able to verbalize understanding.  Discharge Medication: Allergies as of 03/30/2022       Reactions   Codeine Nausea And Vomiting   Tramadol Nausea Only        Medication List     TAKE these medications    acetaminophen 325 MG tablet Commonly known as: TYLENOL Take 2 tablets (650 mg total) by mouth every 4 (four) hours as needed for mild pain ((score 1 to 3) or temp > 100.5).   b complex vitamins tablet Take 1 tablet by mouth daily.   carvedilol 3.125 MG tablet Commonly known as: COREG Take 1 tablet (3.125 mg total) by mouth 2 (two) times daily with a meal.   enoxaparin 40 MG/0.4ML injection Commonly known as: LOVENOX Inject 0.4 mLs (40 mg total) into the skin daily.   feeding supplement (NEPRO CARB STEADY) Liqd Take 237 mLs by mouth 3 (three) times daily between meals.   FreeStyle Libre 14 Day Reader [employer] 14 Day Sensor Misc APPLY ONE SENSOR ONTO SKIN EVERY 14 DAYS   furosemide 40 MG tablet Commonly known as: LASIX Take 1 tablet (40 mg total) by mouth daily. What changed: See the new instructions.   gabapentin 100 MG capsule Commonly known as: NEURONTIN Take 100 mg by mouth at bedtime.   glimepiride 4 MG tablet Commonly known as: AMARYL TAKE ONE TABLET ('4MG'$  TOTAL) BY MOUTH DAILY WITH BREAKFAST What changed: See the new instructions.   levothyroxine 88 MCG tablet Commonly known as: SYNTHROID Take 1 tablet (88 mcg total) by mouth daily.   magnesium oxide 400 (240 Mg) MG tablet Commonly known as: MAG-OX Take 400 mg by mouth daily.   methocarbamol 500 MG tablet Commonly known as: ROBAXIN Take 1 tablet (500 mg total) by mouth every 8 (eight) hours as needed for muscle spasms. What changed: when to take this   multivitamin with  minerals Tabs tablet Take 1 tablet by mouth daily.   nitroGLYCERIN 0.4 MG SL tablet Commonly known as: Nitrostat Place 1 tablet (0.4 mg total) under the tongue every 5 (five) minutes as needed for chest pain.   pantoprazole 40 MG tablet Commonly known as: PROTONIX TAKE ONE TABLET BY MOUTH ONCE DAILY   potassium chloride 10 MEQ tablet Commonly known as: KLOR-CON TAKE ONE TABLET (10MEQ TOTAL) BY MOUTH DAILY What changed: See the new instructions.   rOPINIRole 0.5 MG tablet Commonly known as: REQUIP Take 0.5 mg by mouth 3 (three) times daily.   rosuvastatin 20 MG tablet Commonly known as: CRESTOR TAKE ONE TABLET ('20MG'$  TOTAL) BY MOUTH DAILY What changed: See the new instructions.   sulfamethoxazole-trimethoprim 800-160 MG tablet Commonly known as: BACTRIM DS Take 1 tablet by mouth 2 (two) times daily for 4 days.   traZODone 50 MG tablet Commonly known as: DESYREL Take 1 tablet (50 mg total) by mouth at bedtime.   UNABLE TO FIND Home health supplies for CPAP   UNABLE TO FIND [place] sock aid to help with support hose. Size XL.  Dx:I73.9, G25.81, R60.9   vitamin B-12 1000 MCG tablet Commonly known as: CYANOCOBALAMIN Take 1,000 mcg by mouth daily.   Vitamin D (Ergocalciferol) 1.25 MG (50000 UNIT) Caps capsule Commonly known as: DRISDOL Take 50,000 Units by mouth every 7 (seven) days.  Discharge Care Instructions  (From admission, onward)           Start     Ordered   03/30/22 0000  Discharge wound care:       Comments: Constant repositioning and preventive measures.  No signs of superimposed infection.   03/30/22 1051            Discharge Assessment: Vitals:   03/30/22 0851 03/30/22 1301  BP: (!) 165/78 131/61  Pulse:  89  Resp:  20  Temp:  98.9 F (37.2 C)  SpO2:  94%   Skin clean, dry and intact without evidence of skin break down, no evidence of skin tears noted. IV catheter discontinued intact. Site without signs and  symptoms of complications - no redness or edema noted at insertion site, patient denies c/o pain - only slight tenderness at site.  Dressing with slight pressure applied.  D/c Instructions-Education: Discharge instructions given to patient/family with verbalized understanding. D/c education completed with patient/family including follow up instructions, medication list, d/c activities limitations if indicated, with other d/c instructions as indicated by MD - patient able to verbalize understanding, all questions fully answered. Patient instructed to return to ED, call 911, or call MD for any changes in condition.  Patient escorted via [place], and D/C home via private auto.  Dorcas Mcmurray, LPN 1/88/4166 0:63 PM

## 2022-03-30 NOTE — Care Management Important Message (Signed)
Important Message  Patient Details  Name: [name] MRN: [id number] Date of Birth: December 09, 1945   Medicare Important Message Given:  N/A - LOS <3 / Initial given by admissions     Tommy Medal 03/30/2022, 11:07 AM

## 2022-03-30 NOTE — Discharge Summary (Signed)
Physician Discharge Summary   Patient: [name] MRN: [id number] DOB: 06/02/1946  Admit date:     03/26/2022  Discharge date: 03/30/22  Discharge Physician: Barton Dubois   PCP: Renee Rival, FNP   Recommendations at discharge:  Repeat basic metabolic panel in 5 days to follow electrolytes and renal function. Continue physical rehabilitation/conditioning as per the skilled nursing facility protocol. Repeat CBC in 5 days to follow hemoglobin/WBCs trend and stability. Outpatient follow-up with neurosurgery as previously instructed. Complete 4 more days of oral Bactrim to finalize antibiotic treatment for UTI.   Discharge Diagnoses: Principal Problem:   UTI (urinary tract infection) Active Problems:   Cervical spondylosis with myelopathy   Leukocytosis   Hypertension   Hyperlipidemia   Type 2 diabetes mellitus (HCC)   Hypothyroidism   Protein-calorie malnutrition, severe   Bilateral carotid artery disease (HCC)   Macrocytic anemia   Prolonged QT interval   GERD (gastroesophageal reflux disease)   Hyponatremia   Dehydration   Chronic diastolic CHF (congestive heart failure) (HCC)   Pressure injury of skin   Acute sepsis ([place])   Brief hospital mission course: As per H&P written by Dr. Josephine Cables on 03/26/2022 [name] is a 76 y.o. male with medical history significant of hypertension hyperlipidemia, chronic diastolic CHF, CAD, W0JW, hypothyroidism, GERD, RLS, and severe cervical spondylosis with myelopathy s/p elective C5 corpectomy and subsequent evacuation of anterior cervical hematoma with removal and replacement of hardware (03/11/2022) who presents to the emergency department via EMS from a  nursing home due to a report of hypotension and hypoxia.  However, on arrival of EMS team, BP was 119/66.  At bedside, patient complained of generalized weakness that has been ongoing since his surgery, wife at bedside states that patient has not been able to ambulate with  his walker since the surgery due to weakness.  Patient complained of burning sensation on urination that has been ongoing prior to being discharged from [facility].  Patient also had dysphagia after the surgery but is now tolerating dysphagia 3 diet.  He denies nausea, vomiting, chest pain, abdominal pain.   Patient was admitted from 6/22 to 7/4 at [facility] due to cervical spondylosis with myelopathy which was repaired as described above.   ED Course:  In the emergency department, temperature was 100.30F, she was intermittently tachypneic and other vital signs were within normal range, O2 sats ranged within 95-97% on 3 LPM of oxygen.  Work-up in the ED showed leukocytosis and macrocytic anemia.  BMP was normal except for hyponatremia and hyperglycemia.  Albumin 3.2, lactic acid x2 was negative, urinalysis showed moderate leukocytes and few bacteria, proteinuria and has a cloudy appearance. CT head without contrast showed no evidence for acute intracranial abnormality Chest x-ray showed cardiomegaly without acute abnormality of the lungs in AP portable projection Neurosurgeon (Dr. Ellene Route) was consulted due to patient's elevated WBC level, he does not think it was related to the surgery.  He does not think the patient needs to be transferred to [facility], however, he will consult on patient if admitted to [facility]. Empiric IV antibiotics (ceftriaxone and vancomycin) were given, Tylenol was given and IV hydration was provided.  Hospitalist was asked to admit patient for further evaluation and management.  Assessment and Plan: * UTI (urinary tract infection) -Complaining of burning sensation while urinating at time of admission. -Cultures demonstrated positive Proteus species sensitive to ceftriaxone. -Patient will be discharged on oral Bactrim in order to complete antibiotic therapy (4 more days  pending at discharge). -Currently afebrile and reporting no dysuria. -Continue supportive care and adequate  hydration.  Cervical spondylosis with myelopathy -Continue outpatient follow-up with neurosurgery -Continue analgesics and muscle relaxants as previously prescribed. -Continue physical therapy for rehabilitation.  Leukocytosis -In the setting of UTI -Continue to follow culture results and continue IV antibiotics-follow trend. -WBCs over 30,000 at time of admission; down to 12.9 (significantly improved) -Continue current treatment. -Repeat CBC to follow WBCs trend.  Dysphagia -in the setting of recent neck surgery intervention and deconditioning -seen by Speech therapy with recommendations for dysphagia 3 diet and thin liquids -continue rehab and conditioning at SNF.  Hyperlipidemia - Continue the use of statins.  Hypertension - Stable overall -Continue current antihypertensive agents.   Type 2 diabetes mellitus ([place]) - Resume home oral hypoglycemic agents. -Modified carbohydrates diet discussed with patient. -Continue to follow CBGs and adjust hypoglycemic regimen as required.  Hypothyroidism -Continue Synthroid. -Outpatient follow-up for thyroid panel.  Protein-calorie malnutrition, severe - Feeding supplements and multivitamin as per dietitian recommendations -Patient has not been eating well since recent hospitalization and surgical intervention. -Continue supportive care, continue adequate hydration.  Acute sepsis ([place]) -in the setting of proteus species UTI -patient met criteria for sepsis at time of admission. -by discharge time sepsis features resolved. -complete antibiotic therapy.  Pressure injury of skin - Continue constant repositioning and preventive measures. -Pressure injury present at time of admission; no signs of superimposed infection.  Chronic diastolic CHF (congestive heart failure) (HCC) -Continue to follow daily weights and maintain adequate hydration -Currently compensated -Patient denies shortness of breath or orthopnea. -resume adjusted  dose of lasix and follow low sodium diet.  Dehydration -fluid resuscitation provided. -advise to maintain adequate hydration.  Hyponatremia -In the setting of dehydration/decreased oral intake. -Continue to maintain adequate hydration -Follow electrolytes trend. -Sodium level last checked 133 and stable. -patient asymptomatic.  GERD (gastroesophageal reflux disease) -Continue PPI.  Prolonged QT interval - Minimize the use of medications that can further prolong QT -Continue telemetry monitoring -Follow electrolytes and replete them as needed.  Macrocytic anemia -M57 and folic acid within normal limits -Continue to follow hemoglobin trend.  Bilateral carotid artery disease (HCC) -Continue the use of statins and adequate blood pressure control; as part of risk factor modifications.   Consultants: none Procedures performed: See below for x-ray reports. Disposition: Skilled nursing facility Diet recommendation: Dysphagia 3 diet with thin liquids; heart healthy/low-sodium.  DISCHARGE MEDICATION: Allergies as of 03/30/2022       Reactions   Codeine Nausea And Vomiting   Tramadol Nausea Only        Medication List     TAKE these medications    acetaminophen 325 MG tablet Commonly known as: TYLENOL Take 2 tablets (650 mg total) by mouth every 4 (four) hours as needed for mild pain ((score 1 to 3) or temp > 100.5).   b complex vitamins tablet Take 1 tablet by mouth daily.   carvedilol 3.125 MG tablet Commonly known as: COREG Take 1 tablet (3.125 mg total) by mouth 2 (two) times daily with a meal.   enoxaparin 40 MG/0.4ML injection Commonly known as: LOVENOX Inject 0.4 mLs (40 mg total) into the skin daily.   feeding supplement (NEPRO CARB STEADY) Liqd Take 237 mLs by mouth 3 (three) times daily between meals.   FreeStyle Libre 14 Day Reader [employer] 14 Day Sensor Misc APPLY ONE SENSOR ONTO SKIN EVERY 14 DAYS   furosemide 40 MG  tablet Commonly known  as: LASIX Take 1 tablet (40 mg total) by mouth daily. What changed: See the new instructions.   gabapentin 100 MG capsule Commonly known as: NEURONTIN Take 100 mg by mouth at bedtime.   glimepiride 4 MG tablet Commonly known as: AMARYL TAKE ONE TABLET ($RemoveBef'4MG'NfWURHDgav$  TOTAL) BY MOUTH DAILY WITH BREAKFAST What changed: See the new instructions.   levothyroxine 88 MCG tablet Commonly known as: SYNTHROID Take 1 tablet (88 mcg total) by mouth daily.   magnesium oxide 400 (240 Mg) MG tablet Commonly known as: MAG-OX Take 400 mg by mouth daily.   methocarbamol 500 MG tablet Commonly known as: ROBAXIN Take 1 tablet (500 mg total) by mouth every 8 (eight) hours as needed for muscle spasms. What changed: when to take this   multivitamin with minerals Tabs tablet Take 1 tablet by mouth daily.   nitroGLYCERIN 0.4 MG SL tablet Commonly known as: Nitrostat Place 1 tablet (0.4 mg total) under the tongue every 5 (five) minutes as needed for chest pain.   pantoprazole 40 MG tablet Commonly known as: PROTONIX TAKE ONE TABLET BY MOUTH ONCE DAILY   potassium chloride 10 MEQ tablet Commonly known as: KLOR-CON TAKE ONE TABLET (10MEQ TOTAL) BY MOUTH DAILY What changed: See the new instructions.   rOPINIRole 0.5 MG tablet Commonly known as: REQUIP Take 0.5 mg by mouth 3 (three) times daily.   rosuvastatin 20 MG tablet Commonly known as: CRESTOR TAKE ONE TABLET ($RemoveBef'20MG'tWBDKOZsPl$  TOTAL) BY MOUTH DAILY What changed: See the new instructions.   sulfamethoxazole-trimethoprim 800-160 MG tablet Commonly known as: BACTRIM DS Take 1 tablet by mouth 2 (two) times daily for 4 days.   traZODone 50 MG tablet Commonly known as: DESYREL Take 1 tablet (50 mg total) by mouth at bedtime.   UNABLE TO FIND Home health supplies for CPAP   UNABLE TO FIND [employer] sock aid to help with support hose. Size XL.  Dx:I73.9, G25.81, R60.9   vitamin B-12 1000 MCG tablet Commonly known as:  CYANOCOBALAMIN Take 1,000 mcg by mouth daily.   Vitamin D (Ergocalciferol) 1.25 MG (50000 UNIT) Caps capsule Commonly known as: DRISDOL Take 50,000 Units by mouth every 7 (seven) days.               Discharge Care Instructions  (From admission, onward)           Start     Ordered   03/30/22 0000  Discharge wound care:       Comments: Constant repositioning and preventive measures.  No signs of superimposed infection.   03/30/22 1051            Contact information for follow-up providers     Paseda, Dewaine Conger, FNP. Schedule an appointment as soon as possible for a visit in 2 week(s).   Specialty: Nurse Practitioner Contact information: [address] [place] [address] [phone]         [name], MD .   Specialties: Cardiology, Radiology Contact information: [address] [place] [address] [phone]              Contact information for after-discharge care     [facility] .   Service: Skilled Nursing Contact information: [address] [place] [phone]                    Discharge Exam: [name] Weights   03/26/22 1237  Weight: 77.1  kg   General exam: Alert, awake, oriented x 3; following commands appropriately.  Complaining of left-sided neck pain.  No nausea or vomiting. Respiratory system: Good saturation appreciated on room air; no using accessory muscles.  Normal effort. Cardiovascular system:RRR. No rubs or gallops.  No JVD. Gastrointestinal system: Abdomen is nondistended, soft and nontender. No organomegaly or masses felt. Normal bowel sounds heard. Central nervous system: No focal deficits. Extremities: No cyanosis or clubbing.  No edema. Skin: No petechiae; stage II left buttock pressure injury present at time of admission without superimposed infection.  Also seen at time of admission  patient with close incisions around his neck from recent neurosurgical intervention.  Wounds are clean and intact. Psychiatry: Judgement and insight appear normal. Mood & affect appropriate.       Condition at discharge: Stable and improved.  The results of significant diagnostics from this hospitalization (including imaging, microbiology, ancillary and laboratory) are listed below for reference.   Imaging Studies: CT Head Wo Contrast  Result Date: 03/26/2022 CLINICAL DATA:  Dizziness. EXAM: CT HEAD WITHOUT CONTRAST TECHNIQUE: Contiguous axial images were obtained from the base of the skull through the vertex without intravenous contrast. RADIATION DOSE REDUCTION: This exam was performed according to the departmental dose-optimization program which includes automated exposure control, adjustment of the mA and/or kV according to patient size and/or use of iterative reconstruction technique. COMPARISON:  11/17/2021 FINDINGS: Brain: There is moderate central and cortical atrophy. Periventricular white matter changes are consistent with small vessel disease. Small, remote lacunar infarcts are identified within the basal ganglia bilaterally and appear stable. There is no intra or extra-axial fluid collection or mass lesion. The basilar cisterns and ventricles have a normal appearance. There is no CT evidence for acute infarction or hemorrhage. Vascular: There is dense atherosclerotic calcification of the internal carotid arteries. No hyperdense vessels. Skull: Normal. Negative for fracture or focal lesion. Sinuses/Orbits: No acute finding. Other: None IMPRESSION: 1. Atrophy and small vessel disease. 2. Remote lacunar basal ganglia infarcts. 3.  No evidence for acute intracranial abnormality. Electronically Signed   By: Nolon Nations M.D.   On: 03/26/2022 15:30   DG Chest Port 1 View  Result Date: 03/26/2022 CLINICAL DATA:  Shortness of breath, hypertension EXAM: PORTABLE CHEST 1 VIEW COMPARISON:   03/15/2022 FINDINGS: Cardiomegaly. Both lungs are clear. The visualized skeletal structures are unremarkable. IMPRESSION: Cardiomegaly without acute abnormality of the lungs in AP portable projection. Electronically Signed   By: Delanna Ahmadi M.D.   On: 03/26/2022 13:07   CT CERVICAL SPINE WO CONTRAST  Result Date: 03/19/2022 CLINICAL DATA:  76 year old male with neck pain status post cervical spine corpectomy and fusion. Postoperative hematoma with increased weakness status post cervical evacuation of hematoma and revision of ACDF on 03/11/2022. EXAM: CT CERVICAL SPINE WITHOUT CONTRAST TECHNIQUE: Multidetector CT imaging of the cervical spine was performed without intravenous contrast. Multiplanar CT image reconstructions were also generated. RADIATION DOSE REDUCTION: This exam was performed according to the departmental dose-optimization program which includes automated exposure control, adjustment of the mA and/or kV according to patient size and/or use of iterative reconstruction technique. COMPARISON:  Initial postoperative CT cervical spine 03/11/2022. Cervical MRI yesterday 03/18/2022. FINDINGS: Alignment: Stable from the initial postoperative CT 03/11/2022. Mild straightening of cervical lordosis. Cervicothoracic junction alignment is within normal limits. Posterior element alignment appears maintained. Skull base and vertebrae: Visualized skull base is intact. No atlanto-occipital dissociation. C1 and C2 are intact and aligned. See postoperative and chronic degenerative osseous details below. Soft tissues  and spinal canal: Prevertebral postoperative fluid or edema up to 11 mm thick from the C3 through the C6 level. And this is somewhat contiguous with fluid tracking in the left paralaryngeal space toward the skin incision (series 5, image 63) which also has simple fluid density. The more superficial collection there is about 3 cm. No soft tissue gas. A nasoenteric tube is in place and courses  appropriately into the upper esophagus. Mild mass effect on the posterior pharynx and larynx. Superimposed small surgical clips in the left carotid space. Disc levels: C2-C3 posterior element ankylosis and interbody ankylosis mostly on the left. C3-C4 posterior element and interbody ankylosis. Sequelae of C3 vertebral body hardware removal. C4-C5 combined ACDF and corpectomy. Revision of the active C4 cortical screws, with unchanged small bilateral C4 cortical screw fragments. Corpectomy spacer also revised and taller. There remains a small bone fragment in the anterior left canal at C4 (series 4, image 56) although smaller. Residual C5 uncovertebral spurring also greater on the left. Moderate to severe chronic bilateral facet hypertrophy. C5-C6 corpectomy and hardware changes appear stable. C6 cortical screws intact. Mild foraminal endplate spurring. C6-C7 disc space loss with endplate and facet spurring appears stable. C7-T1 disc space loss with endplate and facet spurring appears stable. Upper chest: Visible upper thoracic vertebrae appear intact with endplate and facet degeneration. Upper lungs are clear. Subglottic trachea is clear. Other: Negative visible noncontrast posterior fossa. IMPRESSION: 1. Revision of C4-C5 corpectomy and fusion since the CT on 03/11/2022. Smaller residual bone fragment in the left C4 canal (series 4, image 56). 2. Interval stable C5-C6 corpectomy and fusion appearance. 3. Underlying C2-C3 and C3-C4 ankylosis. Chronic C6-C7 and C7-T1 degeneration. 4. Postoperative prevertebral fluid collection, likely seroma, with similar fluid tracking along the left paralaryngeal space toward presumed skin incision site. 5. Partially visible nasoenteric tube with no adverse features. Electronically Signed   By: Genevie Ann M.D.   On: 03/19/2022 05:43   MR CERVICAL SPINE WO CONTRAST  Result Date: 03/18/2022 CLINICAL DATA:  Myelopathy, chronic, cervical spine. Additional history provided by scanning  technologist: Patient reports left-sided weakness. EXAM: MRI CERVICAL SPINE WITHOUT CONTRAST TECHNIQUE: Multiplanar, multisequence MR imaging of the cervical spine was performed. No intravenous contrast was administered. COMPARISON:  Intraoperative radiographs of the cervical spine 03/11/2022. Cervical spine CT 03/11/2022. Cervical spine MRI 02/17/2022. FINDINGS: Intermittently motion degraded examination, limiting evaluation. Most notably, there is mild-to-moderate motion degradation of the sagittal T2 TSE sequence, moderate to severe motion degradation of the least motion degraded axial T2 TSE sequence and severe motion degradation of the axial T2 GRE sequence. Alignment: Straightening of the expected cervical lordosis. Trace grade 1 anterolisthesis at C7-T1 and T1-T2. Vertebrae: Susceptibility artifact arising from ACDF hardware at the C4-C6 levels, and arising from a C5 corpectomy device. No appreciable significant marrow edema or focal suspicious osseous lesion. Facet joint ankylosis on the left at C2-C3 and bilaterally at C3-C4, better appreciated on the prior CT. Cord: Redemonstrated focus of chronic myelomalacia within the spinal cord at the C3-C4 level. Within described limitations, no signal abnormality is appreciated within the spinal cord elsewhere. Posterior Fossa, vertebral arteries, paraspinal tissues: No abnormality identified within included portions of the posterior fossa. Flow voids preserved within the imaged cervical vertebral arteries. T2 hyperintense and T1 hypointense collection with prevertebral components, as well as components extending into the left neck soft tissues. The prevertebral component spans the C2-C6 levels and measures up to 1.5 cm in AP dimension. The component more laterally within the left  neck soft tissues measures 3.2 x 5.8 cm (series 5, image 15) (series 11, images 9-23). The prevertebral component results in at least moderate narrowing of the aerodigestive tract. Disc  levels: Disc degeneration at the non operative levels, greatest at C6-C7 (advanced) and C7-T1 (moderate to moderately advanced). Generally narrow cervical spinal canal. C2-C3: Shallow disc bulge. Uncovertebral hypertrophy on the left. Facet arthrosis/hypertrophy. No significant spinal canal stenosis. Bilateral neural foraminal narrowing (mild right, moderate left). C3-C4: Vertebral ankylosis. Uncovertebral hypertrophy. Facet and ligamentum flavum hypertrophy. Mild-to-moderate spinal canal stenosis. Bilateral neural foraminal narrowing (moderate right, severe left). C4-C5: Prior ACDF and C5 corpectomy. Uncovertebral hypertrophy.8 mm T2 hypointense focus within the left anterior aspect of the spinal canal, likely corresponding with the bone fragment identified at this site on the prior CT cervical spine of 03/11/2022. Facet arthrosis. The bone fragment contributes to persistent moderate/severe canal stenosis on the left with some flattening of the ventral spinal cord (series 9, image 19). Apparent severe bilateral neural foraminal narrowing, although motion degradation significantly limits evaluation. C5-C6: Prior ACDF and C5 corpectomy. Uncovertebral hypertrophy. Facet arthrosis and ligamentum flavum hypertrophy. Moderate spinal canal stenosis. Apparent moderate bilateral neural foraminal narrowing, although motion degradation limits evaluation. C6-C7: Broad-based central disc protrusion. Bilateral disc osteophyte ridge/uncinate hypertrophy. Facet arthrosis and ligamentum flavum thickening. Ossification of the posterior longitudinal ligament. The disc protrusion contributes to apparent moderate spinal canal stenosis, contacting and mildly flattening of the ventral spinal cord. Bilateral neural foraminal narrowing (mild right, moderate left). C7-T1: Trace grade 1 anterolisthesis. Slight disc bulge. Facet arthrosis and ligamentum flavum thickening. No significant spinal canal stenosis or neural foraminal narrowing.  Impressions 1, 2 and 4 will be called to the ordering clinician or representative by the Radiologist Assistant, and communication documented in the PACS or [employer]. IMPRESSION: 1. Significantly motion degraded examination, as described and limiting evaluation. 2. Prevertebral collection, which also extends into the left neck soft tissues. The prevertebral component of the collection spans the C2-C6 levels and measures up to 1.5 cm in AP dimension. The component of the collection within the left neck soft tissues measures 3.2 cm in AP dimension and 5.8 cm in craniocaudal dimension. The prevertebral component results in at least moderate narrowing of the aerodigestive tract. 3. Prior ACDF at the C3-C6 levels and prior C5 corpectomy. ACDF hardware spans the C4-C6 levels, and a corpectomy device is present at C5. 4. At C4-C5, there is an 8 mm T2 hypointense focus within the left aspect of the spinal canal which likely corresponds with the bone fragment identified at this site on the prior CT of 03/11/2022. This bone fragment contributes to persistent moderate/severe spinal canal stenosis to the left, with some flattening of the left aspect of the spinal cord. 5. Up to moderate spinal canal stenosis at the remaining levels. Multilevel neural foraminal stenosis, as detailed. Electronically Signed   By: Kellie Simmering D.O.   On: 03/18/2022 16:38   DG Naso G Tube Plc W/Fl-No Rad  Result Date: 03/16/2022 CLINICAL DATA:  Feeding tube insertion EXAM: ABDOMEN - 1 VIEW; DG NASO G TUBE PLC W/FL-NO RAD COMPARISON:  None Available. FINDINGS: 22 French tube inserted into the stomach under fluoroscopic guidance. The tube is coiled within the body of the stomach with tip overlying the proximal gastric body. Dose = 4.2 mGy IMPRESSION: Enteric tube is within the stomach. The tip overlies the proximal gastric body. Electronically Signed   By: Macy Mis M.D.   On: 03/16/2022 14:14   DG Abd 1  View  Result Date:  03/16/2022 CLINICAL DATA:  Feeding tube insertion EXAM: ABDOMEN - 1 VIEW; DG NASO G TUBE PLC W/FL-NO RAD COMPARISON:  None Available. FINDINGS: 7 French tube inserted into the stomach under fluoroscopic guidance. The tube is coiled within the body of the stomach with tip overlying the proximal gastric body. Dose = 4.2 mGy IMPRESSION: Enteric tube is within the stomach. The tip overlies the proximal gastric body. Electronically Signed   By: Macy Mis M.D.   On: 03/16/2022 14:14   DG CHEST PORT 1 VIEW  Result Date: 03/15/2022 CLINICAL DATA:  Fever. EXAM: PORTABLE CHEST 1 VIEW COMPARISON:  November 16, 2021 FINDINGS: The cardiac silhouette is mildly enlarged and unchanged in size. There is tortuosity of the descending thoracic aorta. Mild, hazy atelectasis and/or infiltrate is seen along the medial aspect of the right lung base. There is very mild blunting of the right costophrenic angle. No pneumothorax is identified. Postoperative changes are seen within the lower cervical spine. No acute osseous abnormalities are identified. IMPRESSION: 1. Mild, hazy right basilar atelectasis and/or infiltrate. 2. Small right pleural effusion versus pleural thickening. Electronically Signed   By: Virgina Norfolk M.D.   On: 03/15/2022 18:50   DG Swallowing Func-Speech Pathology  Result Date: 03/12/2022 Table formatting from the original result was not included. Images from the original result were not included. Objective Swallowing Evaluation: Type of Study: MBS-Modified Barium Swallow Study  Patient Details Name: [name] MRN: [id number] Date of Birth: 01/16/1946 Today's Date: 03/12/2022 Time: SLP Start Time (ACUTE ONLY): 3716 -SLP Stop Time (ACUTE ONLY): 1250 SLP Time Calculation (min) (ACUTE ONLY): 17 min Past Medical History: Past Medical History: Diagnosis Date  Aftercare following surgery of the circulatory system, NEC 12/04/2013  Angina decubitus ([place]) 05/19/2016  Arrhythmia 05/20/2016  Arthritis    ARTHRITIS, RIGHT FOOT 06/26/2008  Qualifier: Diagnosis of  By: Aline Brochure MD, [name]    Cardiomyopathy- EF NL 01/2013, now 30-35% echo 03/14/2012  Carotid artery occlusion   left s/p CEA  Cellulitis of left foot 05/07/2018  CHF (congestive heart failure) ([place])   Critical lower limb ischemia ([place]) 02/28/2014  Critical limb ischemia   Diabetes mellitus   Elevated troponin 04/11/2016  GERD (gastroesophageal reflux disease)   History of stroke June 2013 03/14/2012  HOH (hard of hearing)   Hypertension   Hypothyroidism   Nonhealing skin ulcer ([place]) 10/28/2014  Obstructive sleep apnea   Orthostatic hypotension   Osteomyelitis ([facility])   left great toe  Pain in the chest 05/17/2016  Peripheral arterial disease ([place]), s/p PTA x 2 RLE   nonhealing ulcers bilaterally on each great toe  Pneumonia   PONV (postoperative nausea and vomiting)   Also hard to awake after anesthesia  Pulmonary nodule 05/17/2016  Restless leg syndrome   Shortness of breath   Stroke ([place]) 03/08/2012  Toe osteomyelitis, left ([facility])   Ulcer of great toe, left, with necrosis of bone ([place])   Wears dentures  Past Surgical History: Past Surgical History: Procedure Laterality Date  AMPUTATION TOE Left 07/25/2018  Procedure: AMPUTATION TOE INTERPHALANGEAL HALLUX LEFT;  Surgeon: Evelina Bucy, DPM;  Location: [facility];  Service: Podiatry;  Laterality: Left;  ANGIOPLASTY  02/28/14  diamond back orbital rotational atherectomy of Rt. tibial  BACK SURGERY    BONE BIOPSY Left 07/25/2018  Procedure: SUPERFICIAL BONE BIOPSY;  Surgeon: Evelina Bucy, DPM;  Location: [facility];  Service: Podiatry;  Laterality: Left;  CARDIAC CATHETERIZATION N/A 05/19/2016  Procedure: Right/Left Heart Cath  and Coronary Angiography;  Surgeon: Belva Crome, MD;  Location: [facility];  Service: Cardiovascular;  Laterality: N/A;  CERVICAL FUSION    ENDARTERECTOMY Left 11/29/2013  Procedure: ENDARTERECTOMY CAROTID;  Surgeon: Serafina Mitchell, MD;  Location: [facility];  Service: Vascular;  Laterality:  Left;  ESOPHAGOGASTRODUODENOSCOPY  02/2010  Dr. Gala Romney: patient presented with food impaction, schatzki ring with superimposed component of stricture with erosive reflux esophagitis, s/p disimpaction but dilation planned at later date   ESOPHAGOGASTRODUODENOSCOPY (EGD) WITH PROPOFOL N/A 08/13/2019  Dr. Gala Romney: Erosive reflux esophagitis with mild stricture and incidental Mallory-Weiss tear which precluded esophageal dilation.  Medium sized hiatal hernia.  ESOPHAGOGASTRODUODENOSCOPY (EGD) WITH PROPOFOL N/A 10/11/2019  Dr. Gala Romney: Esophageal stenosis status post dilation, moderate hiatal hernia  EYE SURGERY    FLEXIBLE SIGMOIDOSCOPY N/A 08/13/2019  Procedure: FLEXIBLE SIGMOIDOSCOPY;  Surgeon: [name], MD;  [facility];  Service: Endoscopy;  Laterality: N/A;  colonoscopy aboerted due to formed stool and poor prep  FOOT SURGERY    KNEE ARTHROSCOPY WITH MEDIAL MENISECTOMY Left 05/06/2020  Procedure: KNEE ARTHROSCOPY WITH MEDIAL MENISCECTOMY AND LATERAL MENISCECTOMY;  Surgeon: [name], MD;  Location: [facility];  Service: Orthopedics;  Laterality: Left;  Lower ext duplex doppler  03/14/14  Rt ABI 1.2  LOWER EXTREMITY ANGIOGRAM Bilateral 02/18/2014  Procedure: LOWER EXTREMITY ANGIOGRAM;  Surgeon: [name], MD;  Location: [facility];  Service: Cardiovascular;  Laterality: Bilateral;  LOWER EXTREMITY ANGIOGRAM N/A 10/31/2014  Procedure: LOWER EXTREMITY ANGIOGRAM;  Surgeon: [name], MD;  [facility];  Service: Cardiovascular;  Laterality: N/A;  MALONEY DILATION N/A 10/11/2019  Procedure: Venia Minks DILATION;  Surgeon: [name], MD;  [facility];  Service: Endoscopy;  Laterality: N/A;  MULTIPLE TOOTH EXTRACTIONS    PV angiogram  02/18/2014  tibial vessel diseas bil.  SPINE SURGERY    tendon achillies lengthing and sesamoid   HPI: Emoni Yang is a 76 year old male who has had a previous a severe stenosis at C3-C4 which Dr Ellene Route decompressed years ago.  The patient  had developed some increasing weakness in his left arm was particularly and the recent MRI demonstrated the presence of left-sided cord compression secondary to ossification of the posterior longitudinal ligament creating significant cord compression on that left side.   He had undergone decompression earlier on 6/22 with fixation from C4-C6, but postoperatively was noted to be weak. CT scan was performed and this demonstrated presence of some blood in the epidural space and some increased soft tissue swelling also suggested of blood and there was some evidence of residual bone still in the canal superiorly at the level of C4.  He was taken back to the operating room to undergo revision and evacuation. Hardware removed with more removal of bone with a longer cage placed. Pt observed to cough with pills given in am on 6/23.  No data recorded  Recommendations for follow up therapy are one component of a multi-disciplinary discharge planning process, led by the attending physician.  Recommendations may be updated based on patient status, additional functional criteria and insurance authorization. Assessment / Plan / Recommendation   03/12/2022   1:00 PM Clinical Impressions Clinical Impression   Primary problem is significant pharyngeal edema around C2-4 that impedes epiglottic inversion during the swallow. There is also reduced opening of pharyngoesophageal segment due to edema. Pt has good strength and movement, but swelleing results in residue that he cannot clear that spills into open vestibule  after the swallow and during re-swallow efforts. Pts aspiration is mostly silent. Attempted slight chin tuck and slight head turn without increased epiglottic deflection achieved. Aspiration risk is minimized by nectar thick liquids, but still present. Pt would be unlikely to achieve adequate nutrition or hydration at this time. Recommend NPO except for ice with cortrak until edema improves. SLP will f/u Monday to reevaluate  SLP Visit Diagnosis Dysphagia, oropharyngeal phase (R13.12) Impact on safety and function Severe aspiration risk;Risk for inadequate nutrition/hydration     03/12/2022   1:00 PM Treatment Recommendations Treatment Recommendations Therapy as outlined in treatment plan below     03/12/2022   1:00 PM Prognosis Prognosis for Safe Diet Advancement Good   03/12/2022   1:00 PM Diet Recommendations SLP Diet Recommendations NPO;Ice chips PRN after oral care;Alternative means - temporary Medication Administration Via alternative means     03/12/2022   1:00 PM Other Recommendations Follow Up Recommendations Acute inpatient rehab (3hours/day) Assistance recommended at discharge Intermittent Supervision/Assistance Functional Status Assessment Patient has had a recent decline in their functional status and demonstrates the ability to make significant improvements in function in a reasonable and predictable amount of time.   03/12/2022   1:00 PM Frequency and Duration  Speech Therapy Frequency (ACUTE ONLY) min 2x/week Treatment Duration 2 weeks     03/12/2022   1:00 PM Oral Phase Oral Phase [facility]    03/12/2022   1:00 PM Pharyngeal Phase Pharyngeal Phase Impaired Pharyngeal- Nectar Teaspoon Reduced airway/laryngeal closure;Reduced epiglottic inversion;Pharyngeal residue - valleculae;Pharyngeal residue - pyriform Pharyngeal- Thin Teaspoon Pharyngeal residue - pyriform;Pharyngeal residue - valleculae;Penetration/Apiration after swallow;Reduced epiglottic inversion     No data to display    DeBlois, Katherene Ponto 03/12/2022, 1:53 PM                     DG Cervical Spine 2 or 3 views  Result Date: 03/11/2022 CLINICAL DATA:  Evacuation of cervical hematoma, initial encounter EXAM: CERVICAL SPINE - 1 VIEW COMPARISON:  Films from earlier in the same day. FINDINGS: Endotracheal tube is noted in place. Changes of anterior fixation from C4-C6 are seen with titanium spacer from prior corpectomy. No acute abnormality is noted. IMPRESSION:  Postsurgical changes consistent with the known history. Electronically Signed   By: Inez Catalina M.D.   On: 03/11/2022 22:22   CT CERVICAL SPINE WO CONTRAST  Result Date: 03/11/2022 CLINICAL DATA:  Anterior cervical disc fusion EXAM: CT CERVICAL SPINE WITHOUT CONTRAST TECHNIQUE: Multidetector CT imaging of the cervical spine was performed without intravenous contrast. Multiplanar CT image reconstructions were also generated. RADIATION DOSE REDUCTION: This exam was performed according to the departmental dose-optimization program which includes automated exposure control, adjustment of the mA and/or kV according to patient size and/or use of iterative reconstruction technique. COMPARISON:  02/17/2022 cervical spine MRI FINDINGS: Alignment: Alignment is physiologic allowing for postsurgical change Skull base and vertebrae: There is anterior fusion at the C4-6 levels with a C5 corpectomy and associated spacer. Screws at C3 of been removed. There is solid C3-4 arthrodesis. There are screw fragments in the C4 body. There is no acute fracture. Soft tissues and spinal canal: There is a small bone fragment in the left subarticular zone of C4. Disc levels: Spinal canal is patent at the postsurgical levels, but there is moderate thecal sac narrowing due to posterior fragments of C5. Upper chest: Clear Other: Expected postsurgical gas within the left neck soft tissues. IMPRESSION: 1. Status post C4-6 anterior fusion with C5 corpectomy and  associated spacer. 2. Solid C3-4 arthrodesis.  Screw fragments remain in the C4 body. 3. Moderate thecal sac narrowing due to posterior fragments of C5. Electronically Signed   By: Ulyses Jarred M.D.   On: 03/11/2022 19:15   DG Cervical Spine 2 or 3 views  Result Date: 03/11/2022 CLINICAL DATA:  Anterior cervical disc fusion EXAM: CERVICAL SPINE - 2-3 VIEW COMPARISON:  09/02/2010 FINDINGS: There is interval removal of surgical hardware at C3-C4 level. Short segment of a surgical screw  is left behind in the body of C4 vertebra from previous fusion at C3-C4 level. There is recent anterior surgical fusion from C4-C6 levels. IMPRESSION: Interval anterior cervical disc fusion from C4-C6 levels. Electronically Signed   By: [name] Picker M.D.   On: 03/11/2022 16:46    Microbiology: Results for orders placed or performed during the hospital encounter of 03/26/22  Blood culture (routine x 2)     Status: Abnormal   Collection Time: 03/26/22  1:13 PM   Specimen: Right Antecubital; Blood  Result Value Ref Range Status   Specimen Description   Final    RIGHT ANTECUBITAL BOTTLES DRAWN AEROBIC AND ANAEROBIC Performed at [facility], [address], [place], [place] [address]    Special Requests   Final    Blood Culture results may not be optimal due to an excessive volume of blood received in culture bottles Performed at [facility], [address], [place], [place] [address]    Culture  Setup Time   Final    GRAM POSITIVE RODS AEROBIC BOTTLE ONLY Gram Stain Report Called to,Read Back By and Verified With: BREE JOHNSON _0  03/27/22 BY [facility]. WORK DONE AT [facility].    Culture (A)  Final    BACILLUS SPECIES Standardized susceptibility testing for this organism is not available. Performed at [facility], [place] [address] [place], [place] [address]    Report Status 03/28/2022 FINAL  Final  Blood culture (routine x 2)     Status: None (Preliminary result)   Collection Time: 03/26/22  1:22 PM   Specimen: BLOOD RIGHT HAND  Result Value Ref Range Status   Specimen Description   Final    BLOOD RIGHT HAND BOTTLES DRAWN AEROBIC AND ANAEROBIC   Special Requests   Final    Blood Culture results may not be optimal due to an excessive volume of blood received in culture bottles   Culture   Final    NO GROWTH 4 DAYS Performed at [facility], [address], [place], [place] [address]    Report Status PENDING  Incomplete  Urine Culture     Status: Abnormal   Collection Time:  03/26/22  4:12 PM   Specimen: In/Out Cath Urine  Result Value Ref Range Status   Specimen Description   Final    IN/OUT CATH URINE Performed at [facility], [address] [place], [place] [address]    Special Requests   Final    NONE Performed at [facility], [address], [place], [place] [address]    Culture >=100,000 COLONIES/mL PROTEUS SPECIES (A)  Final   Report Status 03/29/2022 FINAL  Final   Organism ID, Bacteria PROTEUS SPECIES (A)  Final      Susceptibility   Proteus species - MIC*    AMPICILLIN >=32 RESISTANT Resistant     CEFAZOLIN >=64 RESISTANT Resistant     CEFEPIME <=0.12 SENSITIVE Sensitive     CEFTRIAXONE <=0.25 SENSITIVE Sensitive     CIPROFLOXACIN <=0.25 SENSITIVE Sensitive  GENTAMICIN <=1 SENSITIVE Sensitive     IMIPENEM 2 SENSITIVE Sensitive     NITROFURANTOIN 128 RESISTANT Resistant     TRIMETH/SULFA <=20 SENSITIVE Sensitive     AMPICILLIN/SULBACTAM 16 INTERMEDIATE Intermediate     PIP/TAZO <=4 SENSITIVE Sensitive     * >=100,000 COLONIES/mL PROTEUS SPECIES   *Note: Due to a large number of results and/or encounters for the requested time period, some results have not been displayed. A complete set of results can be found in Results Review.    Labs: CBC: Recent Labs  Lab 03/26/22 1246 03/27/22 0502 03/29/22 0409  WBC 26.5* 31.7* 12.9*  NEUTROABS 21.5*  --   --   HGB 10.9* 9.2* 8.5*  HCT 32.9* 28.0* 26.1*  MCV 101.5* 102.6* 102.0*  PLT 312 309 631   Basic Metabolic Panel: Recent Labs  Lab 03/26/22 1246 03/27/22 0502 03/29/22 0409  NA 132* 134* 133*  K 4.1 3.6 3.8  CL 98 102 101  CO2 _0 GLUCOSE 150* 146* 125*  BUN _1 CREATININE 0.80 0.66 0.52*  CALCIUM 8.0* 7.9* 7.8*  MG  --  2.1  --   PHOS  --  3.1  --    Liver Function Tests: Recent Labs  Lab 03/26/22 1246 03/27/22 0502  AST 19 15  ALT 19 17  ALKPHOS 64 56  BILITOT 1.0 0.8  PROT 7.2 6.1*  ALBUMIN 3.2* 2.6*   CBG: Recent Labs  Lab 03/29/22 0845  03/29/22 1111 03/29/22 1620 03/29/22 2231 03/30/22 0729  GLUCAP 148* 175* 175* 226* 185*    Discharge time spent: greater than 30 minutes.  Signed: Barton Dubois, MD [facility] 03/30/2022

## 2022-03-30 NOTE — Assessment & Plan Note (Signed)
-  in the setting of proteus species UTI -patient met criteria for sepsis at time of admission. -by discharge time sepsis features resolved. -complete antibiotic therapy.

## 2022-03-31 ENCOUNTER — Ambulatory Visit (HOSPITAL_COMMUNITY): Payer: HMO | Admitting: Physical Therapy

## 2022-03-31 ENCOUNTER — Ambulatory Visit: Payer: HMO | Admitting: Podiatry

## 2022-03-31 LAB — CULTURE, BLOOD (ROUTINE X 2): Culture: NO GROWTH

## 2022-04-01 ENCOUNTER — Encounter: Payer: HMO | Admitting: Physical Medicine & Rehabilitation

## 2022-04-05 ENCOUNTER — Ambulatory Visit (HOSPITAL_COMMUNITY): Payer: HMO | Admitting: Physical Therapy

## 2022-04-07 ENCOUNTER — Ambulatory Visit (HOSPITAL_COMMUNITY): Payer: HMO | Admitting: Physical Therapy

## 2022-04-12 ENCOUNTER — Ambulatory Visit (HOSPITAL_COMMUNITY): Payer: HMO | Admitting: Physical Therapy

## 2022-04-14 ENCOUNTER — Ambulatory Visit (HOSPITAL_COMMUNITY): Payer: HMO | Admitting: Physical Therapy

## 2022-04-20 DEATH — deceased

## 2022-04-27 ENCOUNTER — Ambulatory Visit: Payer: HMO | Admitting: Podiatry

## 2022-07-20 NOTE — Progress Notes (Unsigned)
This encounter was created in error - please disregard.  This encounter was created in error - please disregard.

## 2022-08-31 ENCOUNTER — Ambulatory Visit: Payer: HMO | Admitting: Cardiovascular Disease

## 2023-07-10 IMAGING — MR MR HEAD W/O CM
11 of 12 series · 41 of 48 positions shown · non-contrast
Comparison: Head CT 11/16/2021 and MRI 03/10/2012

CLINICAL DATA: Neuro deficit, acute, stroke suspected. Blurred
vision left eye, paresthesias right arm.

EXAM:
MRI HEAD WITHOUT CONTRAST
TECHNIQUE: Multiplanar, multiecho pulse sequences of the brain and surrounding
structures were obtained without intravenous contrast.

[Series 5: DWI · axial · 4.0mm · 0.88mm/px · z∈[-54,+86]mm · 4 of 36 slices shown (1 of 6)]
[im 1/36]
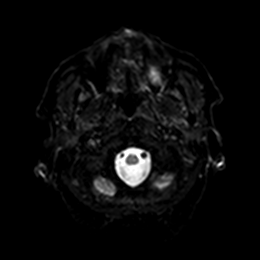
[im 12/36]
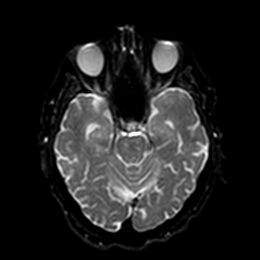
[im 24/36]
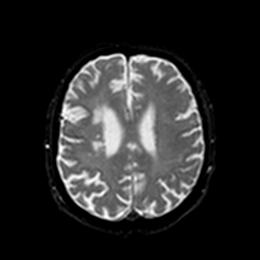
[im 36/36]
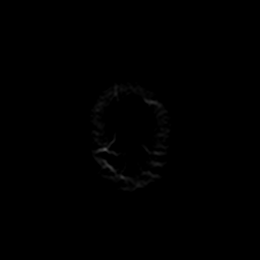

[Series 5: DWI · axial · 4.0mm · 0.88mm/px · z∈[-54,+86]mm · 4 of 36 slices shown (2 of 6)]
[im 1/36]
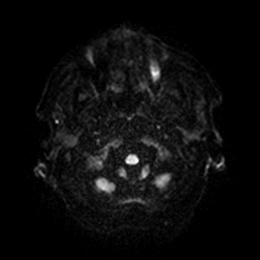
[im 12/36]
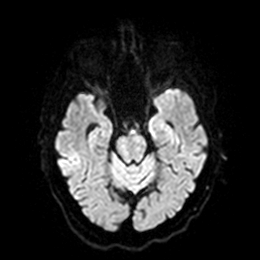
[im 24/36]
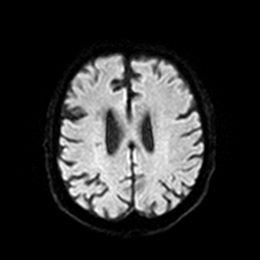
[im 36/36]
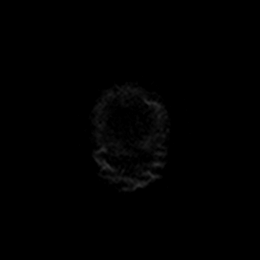

[Series 6: DWI · axial · 4.0mm · 0.88mm/px · z∈[-54,+86]mm · 4 of 36 slices shown (3 of 6)]
[im 1/36]
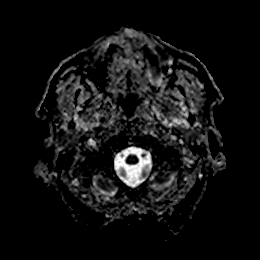
[im 12/36]
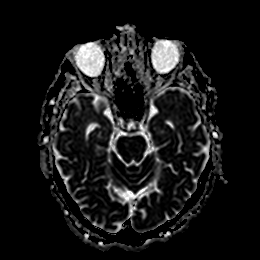
[im 24/36]
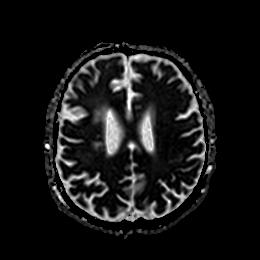
[im 36/36]
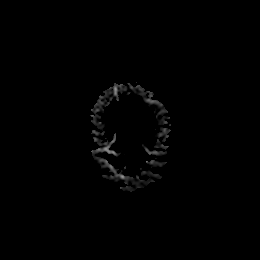

[Series 7: DWI · coronal · 5.0mm · 0.88mm/px · 4 of 28 slices shown (4 of 6)]
[im 1/28]
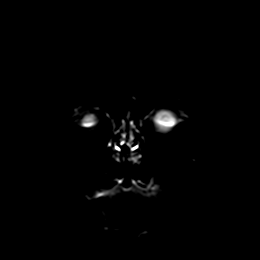
[im 10/28]
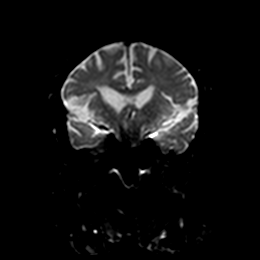
[im 19/28]
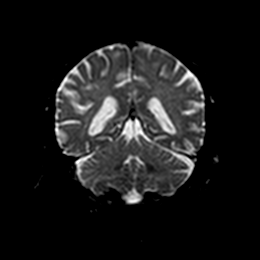
[im 28/28]
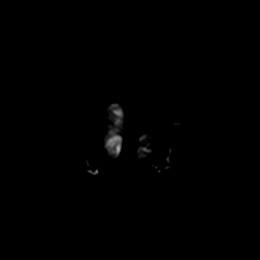

[Series 7: DWI · coronal · 5.0mm · 0.88mm/px · 4 of 28 slices shown (5 of 6)]
[im 1/28]
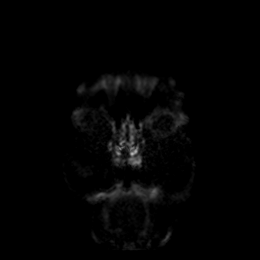
[im 10/28]
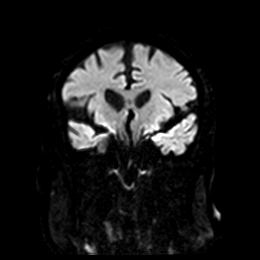
[im 19/28]
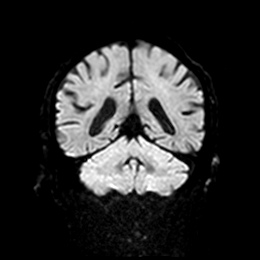
[im 28/28]
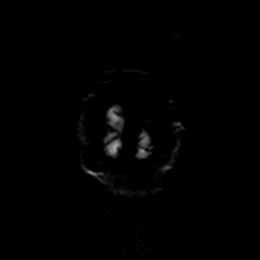

[Series 8: DWI · coronal · 5.0mm · 0.88mm/px · 4 of 28 slices shown (6 of 6)]
[im 1/28]
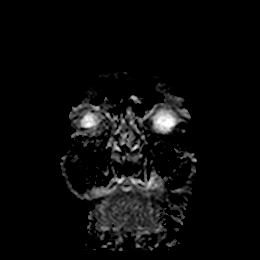
[im 10/28]
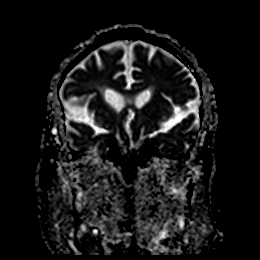
[im 19/28]
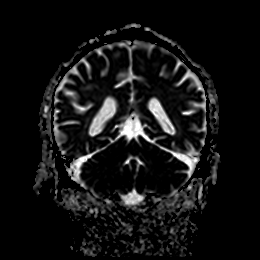
[im 28/28]
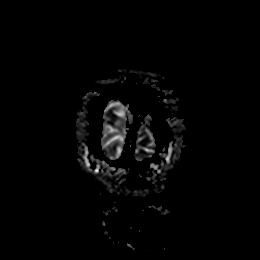

[Series 9: T1 · sagittal · 5.0mm · 0.94mm/px · 3 of 22 slices shown]
[im 1/22]
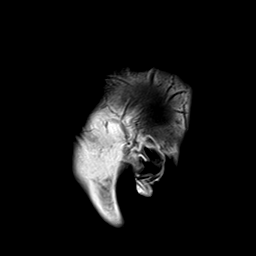
[im 11/22]
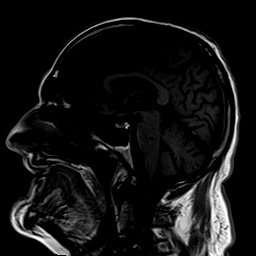
[im 22/22]
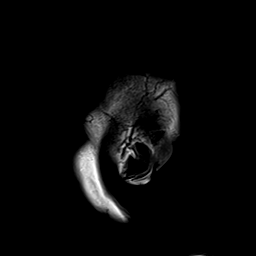

[Series 10: T2 · axial · 5.0mm · 0.72mm/px · z∈[-58,+89]mm · 3 of 22 slices shown (1 of 2)]
[im 1/22]
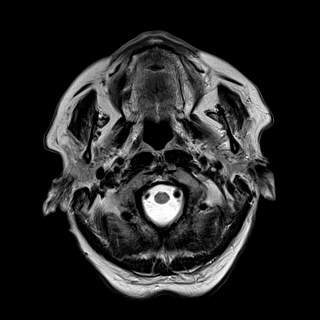
[im 11/22]
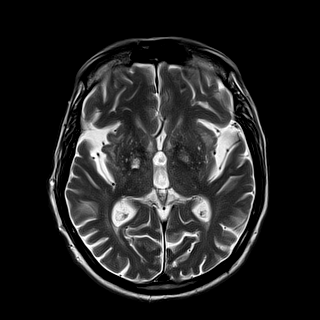
[im 22/22]
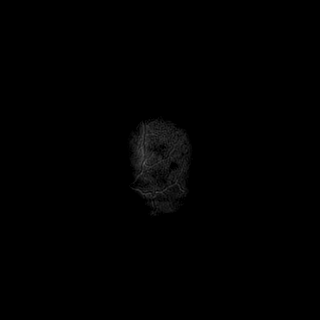

[Series 11: ax hemo · axial · 5.0mm · 0.86mm/px · z∈[-56,+88]mm · 3 of 25 slices shown]
[im 1/25]
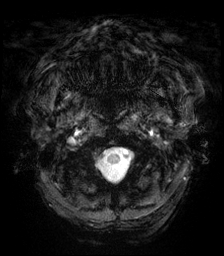
[im 13/25]
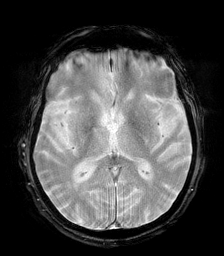
[im 25/25]
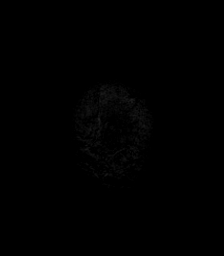

[Series 12: FLAIR · axial · 4.0mm · 0.43mm/px · z∈[-52,+84]mm · 4 of 35 slices shown]
[im 1/35]
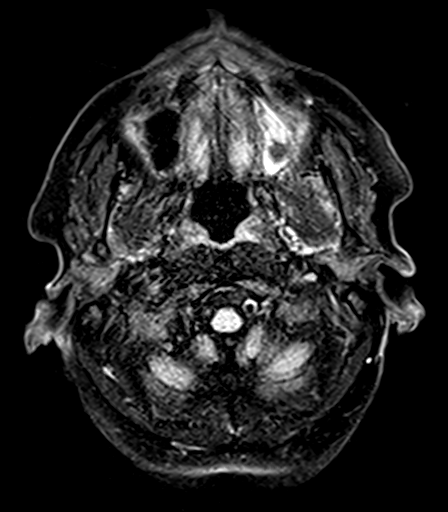
[im 12/35]
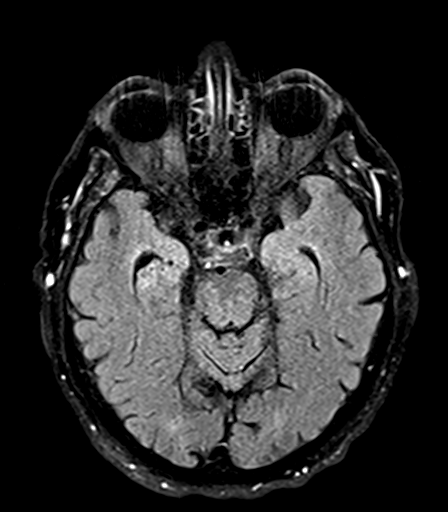
[im 23/35]
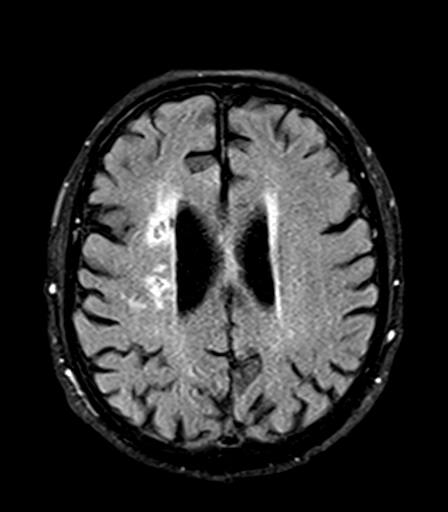
[im 35/35]
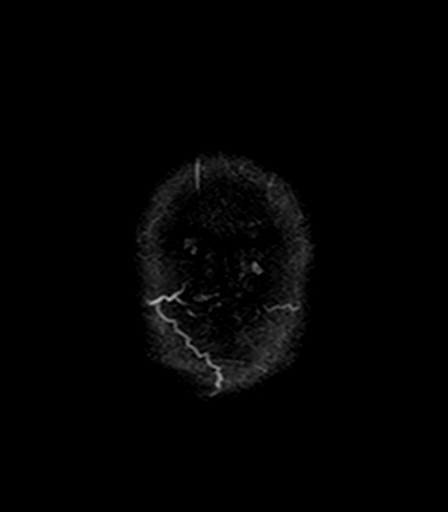

[Series 14: T2 · coronal · 5.0mm · 0.72mm/px · 4 of 28 slices shown (2 of 2)]
[im 1/28]
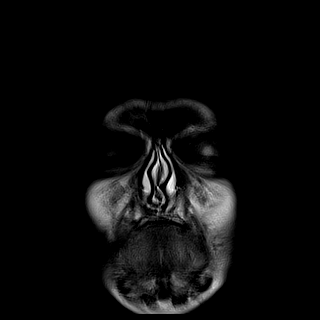
[im 10/28]
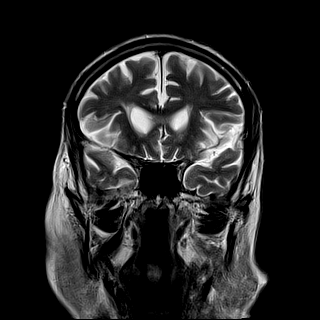
[im 19/28]
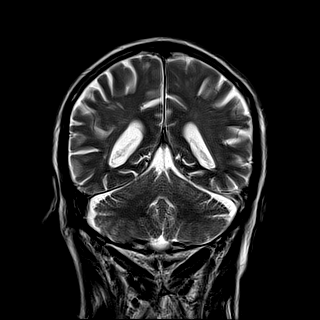
[im 28/28]
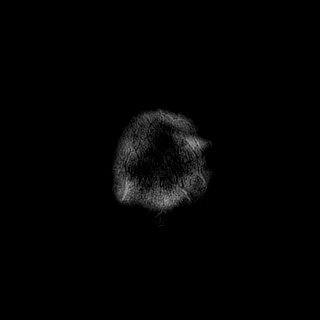

[41 of 48 positions shown; findings below may reference images not displayed]

FINDINGS: Brain: There is no evidence of an acute infarct, intracranial
hemorrhage, mass, midline shift, or extra-axial fluid collection. T2
hyperintensities in the cerebral white matter and pons have
progressed from the prior MRI and are nonspecific but compatible
with mild chronic small vessel ischemic disease. Chronic lacunar
infarcts are noted involving the right corona radiata, right greater
than left basal ganglia, right internal capsule, and pons. There is
mild cerebral atrophy.

Vascular: Major intracranial vascular flow voids are preserved.

Skull and upper cervical spine: Unremarkable bone marrow signal.

Sinuses/Orbits: Bilateral cataract extraction. Mild mucosal
thickening in the left maxillary sinus. Small right mastoid
effusion.

Other: None.
IMPRESSION: 1. No acute intracranial abnormality.
2. Chronic small vessel ischemic disease with lacunar infarcts as
above.
